# Patient Record
Sex: Female | Born: 1955 | Race: Black or African American | Hispanic: No | Marital: Single
Health system: Northeastern US, Academic
[De-identification: ages and names within clinical notes are randomized; demographics above are authoritative.]

## PROBLEM LIST (undated history)

## (undated) DIAGNOSIS — H269 Unspecified cataract: Secondary | ICD-10-CM

## (undated) DIAGNOSIS — K219 Gastro-esophageal reflux disease without esophagitis: Secondary | ICD-10-CM

## (undated) DIAGNOSIS — K5900 Constipation, unspecified: Secondary | ICD-10-CM

## (undated) DIAGNOSIS — E785 Hyperlipidemia, unspecified: Secondary | ICD-10-CM

## (undated) DIAGNOSIS — K649 Unspecified hemorrhoids: Secondary | ICD-10-CM

## (undated) DIAGNOSIS — M1990 Unspecified osteoarthritis, unspecified site: Secondary | ICD-10-CM

## (undated) DIAGNOSIS — D649 Anemia, unspecified: Secondary | ICD-10-CM

## (undated) HISTORY — DX: Gastro-esophageal reflux disease without esophagitis: K21.9

## (undated) HISTORY — DX: Unspecified osteoarthritis, unspecified site: M19.90

## (undated) HISTORY — DX: Hyperlipidemia, unspecified: E78.5

## (undated) HISTORY — DX: Constipation, unspecified: K59.00

## (undated) HISTORY — DX: Unspecified hemorrhoids: K64.9

## (undated) HISTORY — DX: Anemia, unspecified: D64.9

## (undated) HISTORY — DX: Unspecified cataract: H26.9

---
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
## 1985-07-22 HISTORY — PX: LAPAROSCOPIC SALPINGO OOPHERECTOMY: SHX5927

## 2015-01-11 DIAGNOSIS — K5900 Constipation, unspecified: Secondary | ICD-10-CM | POA: Insufficient documentation

## 2015-01-11 DIAGNOSIS — K648 Other hemorrhoids: Secondary | ICD-10-CM | POA: Insufficient documentation

## 2015-02-20 HISTORY — PX: HEMORRHOID BANDING: SHX5850

## 2015-04-17 ENCOUNTER — Ambulatory Visit (HOSPITAL_COMMUNITY)
Admission: RE | Admit: 2015-04-17 | Discharge: 2015-04-17 | Disposition: A | Discharge status: Home / Self Care | Payer: 59 | Source: Ambulatory Visit | Attending: Rheumatology | Admitting: Rheumatology

## 2015-04-17 ENCOUNTER — Other Ambulatory Visit (HOSPITAL_COMMUNITY): Payer: Self-pay | Admitting: Rheumatology

## 2015-04-17 DIAGNOSIS — Z1389 Encounter for screening for other disorder: Secondary | ICD-10-CM | POA: Diagnosis present

## 2015-04-17 DIAGNOSIS — R52 Pain, unspecified: Secondary | ICD-10-CM

## 2015-04-17 DIAGNOSIS — M419 Scoliosis, unspecified: Secondary | ICD-10-CM | POA: Insufficient documentation

## 2015-07-10 ENCOUNTER — Other Ambulatory Visit: Payer: Self-pay | Admitting: Family Medicine

## 2015-07-10 DIAGNOSIS — Z1231 Encounter for screening mammogram for malignant neoplasm of breast: Secondary | ICD-10-CM

## 2015-07-10 DIAGNOSIS — E559 Vitamin D deficiency, unspecified: Secondary | ICD-10-CM | POA: Insufficient documentation

## 2015-07-19 ENCOUNTER — Ambulatory Visit
Admission: RE | Admit: 2015-07-19 | Discharge: 2015-07-19 | Disposition: A | Discharge status: Home / Self Care | Payer: 59 | Source: Ambulatory Visit | Attending: Family Medicine | Admitting: Family Medicine

## 2015-07-19 DIAGNOSIS — Z1231 Encounter for screening mammogram for malignant neoplasm of breast: Secondary | ICD-10-CM

## 2015-10-18 DIAGNOSIS — B351 Tinea unguium: Secondary | ICD-10-CM | POA: Insufficient documentation

## 2015-10-18 DIAGNOSIS — M2010 Hallux valgus (acquired), unspecified foot: Secondary | ICD-10-CM | POA: Insufficient documentation

## 2016-07-17 ENCOUNTER — Encounter: Payer: Self-pay | Admitting: Family Medicine

## 2016-07-17 ENCOUNTER — Telehealth: Payer: Self-pay | Admitting: Family Medicine

## 2016-07-17 NOTE — Telephone Encounter (Signed)
A user error has taken place.

## 2017-01-21 ENCOUNTER — Other Ambulatory Visit: Payer: Self-pay | Admitting: Family Medicine

## 2017-01-21 DIAGNOSIS — Z1231 Encounter for screening mammogram for malignant neoplasm of breast: Secondary | ICD-10-CM

## 2017-02-04 ENCOUNTER — Ambulatory Visit
Admission: RE | Admit: 2017-02-04 | Discharge: 2017-02-04 | Disposition: A | Discharge status: Home / Self Care | Payer: Medicare HMO | Source: Ambulatory Visit | Attending: Family Medicine | Admitting: Family Medicine

## 2017-02-04 DIAGNOSIS — Z1231 Encounter for screening mammogram for malignant neoplasm of breast: Secondary | ICD-10-CM

## 2017-03-04 DIAGNOSIS — M2550 Pain in unspecified joint: Secondary | ICD-10-CM | POA: Diagnosis not present

## 2017-03-04 DIAGNOSIS — M0570 Rheumatoid arthritis with rheumatoid factor of unspecified site without organ or systems involvement: Secondary | ICD-10-CM | POA: Diagnosis not present

## 2017-03-04 DIAGNOSIS — R635 Abnormal weight gain: Secondary | ICD-10-CM | POA: Diagnosis not present

## 2017-04-09 DIAGNOSIS — E782 Mixed hyperlipidemia: Secondary | ICD-10-CM | POA: Diagnosis not present

## 2017-04-09 DIAGNOSIS — E559 Vitamin D deficiency, unspecified: Secondary | ICD-10-CM | POA: Diagnosis not present

## 2017-04-09 DIAGNOSIS — D649 Anemia, unspecified: Secondary | ICD-10-CM | POA: Diagnosis not present

## 2017-04-13 DIAGNOSIS — E559 Vitamin D deficiency, unspecified: Secondary | ICD-10-CM | POA: Diagnosis not present

## 2017-04-13 DIAGNOSIS — D649 Anemia, unspecified: Secondary | ICD-10-CM | POA: Diagnosis not present

## 2017-04-13 DIAGNOSIS — E782 Mixed hyperlipidemia: Secondary | ICD-10-CM | POA: Diagnosis not present

## 2017-06-16 DIAGNOSIS — E669 Obesity, unspecified: Secondary | ICD-10-CM | POA: Diagnosis not present

## 2017-06-16 DIAGNOSIS — R5383 Other fatigue: Secondary | ICD-10-CM | POA: Diagnosis not present

## 2017-06-16 DIAGNOSIS — Z6830 Body mass index (BMI) 30.0-30.9, adult: Secondary | ICD-10-CM | POA: Diagnosis not present

## 2017-06-16 DIAGNOSIS — M2550 Pain in unspecified joint: Secondary | ICD-10-CM | POA: Diagnosis not present

## 2017-06-16 DIAGNOSIS — M05742 Rheumatoid arthritis with rheumatoid factor of left hand without organ or systems involvement: Secondary | ICD-10-CM | POA: Diagnosis not present

## 2017-06-16 DIAGNOSIS — Z79899 Other long term (current) drug therapy: Secondary | ICD-10-CM | POA: Diagnosis not present

## 2017-06-16 DIAGNOSIS — M0579 Rheumatoid arthritis with rheumatoid factor of multiple sites without organ or systems involvement: Secondary | ICD-10-CM | POA: Diagnosis not present

## 2017-07-13 DIAGNOSIS — N898 Other specified noninflammatory disorders of vagina: Secondary | ICD-10-CM | POA: Diagnosis not present

## 2017-07-13 DIAGNOSIS — E782 Mixed hyperlipidemia: Secondary | ICD-10-CM | POA: Diagnosis not present

## 2017-07-13 DIAGNOSIS — N760 Acute vaginitis: Secondary | ICD-10-CM | POA: Diagnosis not present

## 2017-07-13 DIAGNOSIS — Z118 Encounter for screening for other infectious and parasitic diseases: Secondary | ICD-10-CM | POA: Diagnosis not present

## 2017-07-13 DIAGNOSIS — Z0131 Encounter for examination of blood pressure with abnormal findings: Secondary | ICD-10-CM | POA: Diagnosis not present

## 2017-07-14 DIAGNOSIS — E669 Obesity, unspecified: Secondary | ICD-10-CM | POA: Diagnosis not present

## 2017-07-14 DIAGNOSIS — M0579 Rheumatoid arthritis with rheumatoid factor of multiple sites without organ or systems involvement: Secondary | ICD-10-CM | POA: Diagnosis not present

## 2017-07-14 DIAGNOSIS — Z79899 Other long term (current) drug therapy: Secondary | ICD-10-CM | POA: Diagnosis not present

## 2017-07-14 DIAGNOSIS — Z6830 Body mass index (BMI) 30.0-30.9, adult: Secondary | ICD-10-CM | POA: Diagnosis not present

## 2017-07-14 DIAGNOSIS — M2550 Pain in unspecified joint: Secondary | ICD-10-CM | POA: Diagnosis not present

## 2017-08-12 DIAGNOSIS — Z0000 Encounter for general adult medical examination without abnormal findings: Secondary | ICD-10-CM | POA: Diagnosis not present

## 2017-08-12 DIAGNOSIS — Z6830 Body mass index (BMI) 30.0-30.9, adult: Secondary | ICD-10-CM | POA: Diagnosis not present

## 2017-09-25 ENCOUNTER — Encounter: Payer: Self-pay | Admitting: Internal Medicine

## 2017-10-13 DIAGNOSIS — Z79899 Other long term (current) drug therapy: Secondary | ICD-10-CM | POA: Diagnosis not present

## 2017-10-13 DIAGNOSIS — Z6830 Body mass index (BMI) 30.0-30.9, adult: Secondary | ICD-10-CM | POA: Diagnosis not present

## 2017-10-13 DIAGNOSIS — M2550 Pain in unspecified joint: Secondary | ICD-10-CM | POA: Diagnosis not present

## 2017-10-13 DIAGNOSIS — E669 Obesity, unspecified: Secondary | ICD-10-CM | POA: Diagnosis not present

## 2017-10-13 DIAGNOSIS — M0579 Rheumatoid arthritis with rheumatoid factor of multiple sites without organ or systems involvement: Secondary | ICD-10-CM | POA: Diagnosis not present

## 2017-11-04 ENCOUNTER — Other Ambulatory Visit: Payer: Self-pay

## 2017-11-04 ENCOUNTER — Ambulatory Visit (AMBULATORY_SURGERY_CENTER): Payer: Self-pay | Admitting: *Deleted

## 2017-11-04 VITALS — Ht 70.0 in | Wt 212.0 lb

## 2017-11-04 DIAGNOSIS — Z1211 Encounter for screening for malignant neoplasm of colon: Secondary | ICD-10-CM

## 2017-11-04 NOTE — Progress Notes (Signed)
   No egg or soy allergy known to patient  No issues with past sedation with any surgeries  or procedures, no intubation problems  No diet pills per patient No home 02 use per patient  No blood thinners per patient  Pt denies issues with constipation -- as long as takes stool softener daily  No A fib or A flutter  EMMI video sent to pt's e mail

## 2017-11-17 ENCOUNTER — Encounter: Payer: Self-pay | Admitting: Internal Medicine

## 2017-11-26 ENCOUNTER — Encounter: Payer: Medicare HMO | Admitting: Internal Medicine

## 2017-12-30 ENCOUNTER — Other Ambulatory Visit: Payer: Self-pay | Admitting: Family Medicine

## 2017-12-30 DIAGNOSIS — Z1231 Encounter for screening mammogram for malignant neoplasm of breast: Secondary | ICD-10-CM

## 2018-01-13 DIAGNOSIS — E669 Obesity, unspecified: Secondary | ICD-10-CM | POA: Diagnosis not present

## 2018-01-13 DIAGNOSIS — M0579 Rheumatoid arthritis with rheumatoid factor of multiple sites without organ or systems involvement: Secondary | ICD-10-CM | POA: Diagnosis not present

## 2018-01-13 DIAGNOSIS — Z79899 Other long term (current) drug therapy: Secondary | ICD-10-CM | POA: Diagnosis not present

## 2018-01-13 DIAGNOSIS — M2550 Pain in unspecified joint: Secondary | ICD-10-CM | POA: Diagnosis not present

## 2018-01-13 DIAGNOSIS — Z6830 Body mass index (BMI) 30.0-30.9, adult: Secondary | ICD-10-CM | POA: Diagnosis not present

## 2018-02-03 DIAGNOSIS — R5383 Other fatigue: Secondary | ICD-10-CM | POA: Diagnosis not present

## 2018-02-03 DIAGNOSIS — E559 Vitamin D deficiency, unspecified: Secondary | ICD-10-CM | POA: Diagnosis not present

## 2018-02-03 DIAGNOSIS — E782 Mixed hyperlipidemia: Secondary | ICD-10-CM | POA: Diagnosis not present

## 2018-02-03 DIAGNOSIS — Z13220 Encounter for screening for lipoid disorders: Secondary | ICD-10-CM | POA: Diagnosis not present

## 2018-02-04 DIAGNOSIS — H524 Presbyopia: Secondary | ICD-10-CM | POA: Diagnosis not present

## 2018-02-10 DIAGNOSIS — E559 Vitamin D deficiency, unspecified: Secondary | ICD-10-CM | POA: Diagnosis not present

## 2018-02-10 DIAGNOSIS — E782 Mixed hyperlipidemia: Secondary | ICD-10-CM | POA: Diagnosis not present

## 2018-02-10 DIAGNOSIS — R5383 Other fatigue: Secondary | ICD-10-CM | POA: Diagnosis not present

## 2018-02-10 DIAGNOSIS — Z6830 Body mass index (BMI) 30.0-30.9, adult: Secondary | ICD-10-CM | POA: Diagnosis not present

## 2018-03-02 ENCOUNTER — Other Ambulatory Visit: Payer: Self-pay | Admitting: Family Medicine

## 2018-03-02 ENCOUNTER — Ambulatory Visit
Admission: RE | Admit: 2018-03-02 | Discharge: 2018-03-02 | Disposition: A | Discharge status: Home / Self Care | Payer: Medicare HMO | Source: Ambulatory Visit | Attending: Family Medicine | Admitting: Family Medicine

## 2018-03-02 DIAGNOSIS — Z1231 Encounter for screening mammogram for malignant neoplasm of breast: Secondary | ICD-10-CM | POA: Diagnosis not present

## 2018-03-04 DIAGNOSIS — H40023 Open angle with borderline findings, high risk, bilateral: Secondary | ICD-10-CM | POA: Diagnosis not present

## 2018-03-08 DIAGNOSIS — D72818 Other decreased white blood cell count: Secondary | ICD-10-CM | POA: Diagnosis not present

## 2018-03-08 DIAGNOSIS — E782 Mixed hyperlipidemia: Secondary | ICD-10-CM | POA: Diagnosis not present

## 2018-03-12 DIAGNOSIS — E782 Mixed hyperlipidemia: Secondary | ICD-10-CM | POA: Diagnosis not present

## 2018-03-12 DIAGNOSIS — D72818 Other decreased white blood cell count: Secondary | ICD-10-CM | POA: Diagnosis not present

## 2018-03-12 DIAGNOSIS — R69 Illness, unspecified: Secondary | ICD-10-CM | POA: Diagnosis not present

## 2018-04-19 DIAGNOSIS — E663 Overweight: Secondary | ICD-10-CM | POA: Diagnosis not present

## 2018-04-19 DIAGNOSIS — Z6829 Body mass index (BMI) 29.0-29.9, adult: Secondary | ICD-10-CM | POA: Diagnosis not present

## 2018-04-19 DIAGNOSIS — M0579 Rheumatoid arthritis with rheumatoid factor of multiple sites without organ or systems involvement: Secondary | ICD-10-CM | POA: Diagnosis not present

## 2018-04-19 DIAGNOSIS — Z79899 Other long term (current) drug therapy: Secondary | ICD-10-CM | POA: Diagnosis not present

## 2018-04-19 DIAGNOSIS — M2550 Pain in unspecified joint: Secondary | ICD-10-CM | POA: Diagnosis not present

## 2018-06-16 DIAGNOSIS — H353131 Nonexudative age-related macular degeneration, bilateral, early dry stage: Secondary | ICD-10-CM | POA: Diagnosis not present

## 2018-06-16 DIAGNOSIS — H5203 Hypermetropia, bilateral: Secondary | ICD-10-CM | POA: Diagnosis not present

## 2018-06-16 DIAGNOSIS — H2513 Age-related nuclear cataract, bilateral: Secondary | ICD-10-CM | POA: Diagnosis not present

## 2018-06-16 DIAGNOSIS — H401133 Primary open-angle glaucoma, bilateral, severe stage: Secondary | ICD-10-CM | POA: Diagnosis not present

## 2018-06-29 DIAGNOSIS — R69 Illness, unspecified: Secondary | ICD-10-CM | POA: Diagnosis not present

## 2018-06-29 DIAGNOSIS — Z0000 Encounter for general adult medical examination without abnormal findings: Secondary | ICD-10-CM | POA: Diagnosis not present

## 2018-06-29 DIAGNOSIS — Z6829 Body mass index (BMI) 29.0-29.9, adult: Secondary | ICD-10-CM | POA: Diagnosis not present

## 2018-06-29 DIAGNOSIS — Z1211 Encounter for screening for malignant neoplasm of colon: Secondary | ICD-10-CM | POA: Diagnosis not present

## 2018-07-20 DIAGNOSIS — M0579 Rheumatoid arthritis with rheumatoid factor of multiple sites without organ or systems involvement: Secondary | ICD-10-CM | POA: Diagnosis not present

## 2018-07-20 DIAGNOSIS — E663 Overweight: Secondary | ICD-10-CM | POA: Diagnosis not present

## 2018-07-20 DIAGNOSIS — Z6829 Body mass index (BMI) 29.0-29.9, adult: Secondary | ICD-10-CM | POA: Diagnosis not present

## 2018-07-20 DIAGNOSIS — M2550 Pain in unspecified joint: Secondary | ICD-10-CM | POA: Diagnosis not present

## 2018-07-20 DIAGNOSIS — Z79899 Other long term (current) drug therapy: Secondary | ICD-10-CM | POA: Diagnosis not present

## 2018-07-30 ENCOUNTER — Encounter: Payer: Self-pay | Admitting: Family Medicine

## 2018-09-13 DIAGNOSIS — Z0000 Encounter for general adult medical examination without abnormal findings: Secondary | ICD-10-CM | POA: Diagnosis not present

## 2018-09-13 DIAGNOSIS — Z79899 Other long term (current) drug therapy: Secondary | ICD-10-CM | POA: Diagnosis not present

## 2018-09-13 DIAGNOSIS — Z0131 Encounter for examination of blood pressure with abnormal findings: Secondary | ICD-10-CM | POA: Diagnosis not present

## 2018-09-13 DIAGNOSIS — Z13220 Encounter for screening for lipoid disorders: Secondary | ICD-10-CM | POA: Diagnosis not present

## 2018-09-13 DIAGNOSIS — E782 Mixed hyperlipidemia: Secondary | ICD-10-CM | POA: Diagnosis not present

## 2018-09-13 DIAGNOSIS — E559 Vitamin D deficiency, unspecified: Secondary | ICD-10-CM | POA: Diagnosis not present

## 2018-09-15 DIAGNOSIS — E559 Vitamin D deficiency, unspecified: Secondary | ICD-10-CM | POA: Diagnosis not present

## 2018-09-15 DIAGNOSIS — R69 Illness, unspecified: Secondary | ICD-10-CM | POA: Diagnosis not present

## 2018-09-15 DIAGNOSIS — Z23 Encounter for immunization: Secondary | ICD-10-CM | POA: Diagnosis not present

## 2018-09-15 DIAGNOSIS — Z6830 Body mass index (BMI) 30.0-30.9, adult: Secondary | ICD-10-CM | POA: Diagnosis not present

## 2018-09-15 DIAGNOSIS — E782 Mixed hyperlipidemia: Secondary | ICD-10-CM | POA: Diagnosis not present

## 2018-10-26 DIAGNOSIS — M0579 Rheumatoid arthritis with rheumatoid factor of multiple sites without organ or systems involvement: Secondary | ICD-10-CM | POA: Diagnosis not present

## 2018-10-26 DIAGNOSIS — M2550 Pain in unspecified joint: Secondary | ICD-10-CM | POA: Diagnosis not present

## 2018-10-26 DIAGNOSIS — E669 Obesity, unspecified: Secondary | ICD-10-CM | POA: Diagnosis not present

## 2018-10-26 DIAGNOSIS — Z79899 Other long term (current) drug therapy: Secondary | ICD-10-CM | POA: Diagnosis not present

## 2018-10-26 DIAGNOSIS — Z6830 Body mass index (BMI) 30.0-30.9, adult: Secondary | ICD-10-CM | POA: Diagnosis not present

## 2018-12-13 DIAGNOSIS — I6782 Cerebral ischemia: Secondary | ICD-10-CM | POA: Diagnosis not present

## 2018-12-13 DIAGNOSIS — R202 Paresthesia of skin: Secondary | ICD-10-CM | POA: Diagnosis not present

## 2018-12-13 DIAGNOSIS — G9389 Other specified disorders of brain: Secondary | ICD-10-CM | POA: Diagnosis not present

## 2018-12-13 DIAGNOSIS — K148 Other diseases of tongue: Secondary | ICD-10-CM | POA: Diagnosis not present

## 2018-12-13 DIAGNOSIS — R200 Anesthesia of skin: Secondary | ICD-10-CM | POA: Diagnosis not present

## 2018-12-28 DIAGNOSIS — Z733 Stress, not elsewhere classified: Secondary | ICD-10-CM | POA: Diagnosis not present

## 2018-12-28 DIAGNOSIS — G510 Bell's palsy: Secondary | ICD-10-CM | POA: Diagnosis not present

## 2018-12-28 DIAGNOSIS — R69 Illness, unspecified: Secondary | ICD-10-CM | POA: Diagnosis not present

## 2018-12-28 DIAGNOSIS — Z6829 Body mass index (BMI) 29.0-29.9, adult: Secondary | ICD-10-CM | POA: Diagnosis not present

## 2019-01-28 DIAGNOSIS — E663 Overweight: Secondary | ICD-10-CM | POA: Diagnosis not present

## 2019-01-28 DIAGNOSIS — M2550 Pain in unspecified joint: Secondary | ICD-10-CM | POA: Diagnosis not present

## 2019-01-28 DIAGNOSIS — Z79899 Other long term (current) drug therapy: Secondary | ICD-10-CM | POA: Diagnosis not present

## 2019-01-28 DIAGNOSIS — M0579 Rheumatoid arthritis with rheumatoid factor of multiple sites without organ or systems involvement: Secondary | ICD-10-CM | POA: Diagnosis not present

## 2019-01-28 DIAGNOSIS — Z6829 Body mass index (BMI) 29.0-29.9, adult: Secondary | ICD-10-CM | POA: Diagnosis not present

## 2019-04-12 ENCOUNTER — Other Ambulatory Visit: Payer: Self-pay | Admitting: Family Medicine

## 2019-04-12 DIAGNOSIS — Z1231 Encounter for screening mammogram for malignant neoplasm of breast: Secondary | ICD-10-CM

## 2019-04-28 DIAGNOSIS — M0579 Rheumatoid arthritis with rheumatoid factor of multiple sites without organ or systems involvement: Secondary | ICD-10-CM | POA: Diagnosis not present

## 2019-06-15 ENCOUNTER — Ambulatory Visit
Admission: RE | Admit: 2019-06-15 | Discharge: 2019-06-15 | Disposition: A | Discharge status: Home / Self Care | Payer: Medicare HMO | Source: Ambulatory Visit | Attending: Family Medicine | Admitting: Family Medicine

## 2019-06-15 DIAGNOSIS — Z1231 Encounter for screening mammogram for malignant neoplasm of breast: Secondary | ICD-10-CM | POA: Diagnosis not present

## 2019-08-01 DIAGNOSIS — Z79899 Other long term (current) drug therapy: Secondary | ICD-10-CM | POA: Diagnosis not present

## 2019-08-01 DIAGNOSIS — R350 Frequency of micturition: Secondary | ICD-10-CM | POA: Diagnosis not present

## 2019-08-01 DIAGNOSIS — M170 Bilateral primary osteoarthritis of knee: Secondary | ICD-10-CM | POA: Diagnosis not present

## 2019-08-01 DIAGNOSIS — M0579 Rheumatoid arthritis with rheumatoid factor of multiple sites without organ or systems involvement: Secondary | ICD-10-CM | POA: Diagnosis not present

## 2019-08-01 DIAGNOSIS — M2550 Pain in unspecified joint: Secondary | ICD-10-CM | POA: Diagnosis not present

## 2019-08-03 DIAGNOSIS — M170 Bilateral primary osteoarthritis of knee: Secondary | ICD-10-CM | POA: Diagnosis not present

## 2019-08-03 DIAGNOSIS — R32 Unspecified urinary incontinence: Secondary | ICD-10-CM | POA: Diagnosis not present

## 2019-08-03 DIAGNOSIS — B373 Candidiasis of vulva and vagina: Secondary | ICD-10-CM | POA: Diagnosis not present

## 2019-08-03 DIAGNOSIS — Z0000 Encounter for general adult medical examination without abnormal findings: Secondary | ICD-10-CM | POA: Diagnosis not present

## 2019-08-23 DIAGNOSIS — Z1159 Encounter for screening for other viral diseases: Secondary | ICD-10-CM | POA: Diagnosis not present

## 2019-08-30 DIAGNOSIS — M170 Bilateral primary osteoarthritis of knee: Secondary | ICD-10-CM | POA: Diagnosis not present

## 2019-09-03 DIAGNOSIS — H409 Unspecified glaucoma: Secondary | ICD-10-CM | POA: Diagnosis not present

## 2019-09-03 DIAGNOSIS — H524 Presbyopia: Secondary | ICD-10-CM | POA: Diagnosis not present

## 2019-09-03 DIAGNOSIS — H52209 Unspecified astigmatism, unspecified eye: Secondary | ICD-10-CM | POA: Diagnosis not present

## 2019-09-03 DIAGNOSIS — H5203 Hypermetropia, bilateral: Secondary | ICD-10-CM | POA: Diagnosis not present

## 2019-09-03 DIAGNOSIS — H269 Unspecified cataract: Secondary | ICD-10-CM | POA: Diagnosis not present

## 2019-10-28 DIAGNOSIS — E559 Vitamin D deficiency, unspecified: Secondary | ICD-10-CM | POA: Diagnosis not present

## 2019-10-28 DIAGNOSIS — E782 Mixed hyperlipidemia: Secondary | ICD-10-CM | POA: Diagnosis not present

## 2019-10-28 DIAGNOSIS — M0579 Rheumatoid arthritis with rheumatoid factor of multiple sites without organ or systems involvement: Secondary | ICD-10-CM | POA: Diagnosis not present

## 2019-11-03 DIAGNOSIS — N952 Postmenopausal atrophic vaginitis: Secondary | ICD-10-CM | POA: Diagnosis not present

## 2019-11-03 DIAGNOSIS — E782 Mixed hyperlipidemia: Secondary | ICD-10-CM | POA: Diagnosis not present

## 2019-11-03 DIAGNOSIS — E559 Vitamin D deficiency, unspecified: Secondary | ICD-10-CM | POA: Diagnosis not present

## 2019-11-08 DIAGNOSIS — Z23 Encounter for immunization: Secondary | ICD-10-CM | POA: Diagnosis not present

## 2019-12-28 DIAGNOSIS — N390 Urinary tract infection, site not specified: Secondary | ICD-10-CM | POA: Diagnosis not present

## 2020-01-18 DIAGNOSIS — M170 Bilateral primary osteoarthritis of knee: Secondary | ICD-10-CM | POA: Diagnosis not present

## 2020-01-18 DIAGNOSIS — N952 Postmenopausal atrophic vaginitis: Secondary | ICD-10-CM | POA: Diagnosis not present

## 2020-01-18 DIAGNOSIS — N390 Urinary tract infection, site not specified: Secondary | ICD-10-CM | POA: Diagnosis not present

## 2020-02-07 DIAGNOSIS — Z6831 Body mass index (BMI) 31.0-31.9, adult: Secondary | ICD-10-CM | POA: Diagnosis not present

## 2020-02-07 DIAGNOSIS — M2550 Pain in unspecified joint: Secondary | ICD-10-CM | POA: Diagnosis not present

## 2020-02-07 DIAGNOSIS — M170 Bilateral primary osteoarthritis of knee: Secondary | ICD-10-CM | POA: Diagnosis not present

## 2020-02-07 DIAGNOSIS — Z79899 Other long term (current) drug therapy: Secondary | ICD-10-CM | POA: Diagnosis not present

## 2020-02-07 DIAGNOSIS — M0579 Rheumatoid arthritis with rheumatoid factor of multiple sites without organ or systems involvement: Secondary | ICD-10-CM | POA: Diagnosis not present

## 2020-02-07 DIAGNOSIS — E669 Obesity, unspecified: Secondary | ICD-10-CM | POA: Diagnosis not present

## 2020-03-22 DIAGNOSIS — M1711 Unilateral primary osteoarthritis, right knee: Secondary | ICD-10-CM | POA: Diagnosis not present

## 2020-03-22 DIAGNOSIS — M25562 Pain in left knee: Secondary | ICD-10-CM | POA: Diagnosis not present

## 2020-03-22 DIAGNOSIS — M170 Bilateral primary osteoarthritis of knee: Secondary | ICD-10-CM | POA: Diagnosis not present

## 2020-03-22 DIAGNOSIS — M1712 Unilateral primary osteoarthritis, left knee: Secondary | ICD-10-CM | POA: Diagnosis not present

## 2020-03-22 DIAGNOSIS — M25561 Pain in right knee: Secondary | ICD-10-CM | POA: Diagnosis not present

## 2020-05-08 DIAGNOSIS — M0579 Rheumatoid arthritis with rheumatoid factor of multiple sites without organ or systems involvement: Secondary | ICD-10-CM | POA: Diagnosis not present

## 2020-06-07 ENCOUNTER — Other Ambulatory Visit: Payer: Self-pay | Admitting: Family Medicine

## 2020-06-12 DIAGNOSIS — N390 Urinary tract infection, site not specified: Secondary | ICD-10-CM | POA: Diagnosis not present

## 2020-06-12 DIAGNOSIS — N3941 Urge incontinence: Secondary | ICD-10-CM | POA: Diagnosis not present

## 2020-06-12 DIAGNOSIS — M545 Low back pain: Secondary | ICD-10-CM | POA: Diagnosis not present

## 2020-06-12 DIAGNOSIS — N3000 Acute cystitis without hematuria: Secondary | ICD-10-CM | POA: Diagnosis not present

## 2020-07-23 ENCOUNTER — Other Ambulatory Visit: Payer: Self-pay | Admitting: Family Medicine

## 2020-07-23 DIAGNOSIS — Z1231 Encounter for screening mammogram for malignant neoplasm of breast: Secondary | ICD-10-CM

## 2020-08-03 DIAGNOSIS — Z0000 Encounter for general adult medical examination without abnormal findings: Secondary | ICD-10-CM | POA: Diagnosis not present

## 2020-08-03 DIAGNOSIS — Z1339 Encounter for screening examination for other mental health and behavioral disorders: Secondary | ICD-10-CM | POA: Diagnosis not present

## 2020-08-03 DIAGNOSIS — N3941 Urge incontinence: Secondary | ICD-10-CM | POA: Diagnosis not present

## 2020-08-03 DIAGNOSIS — Z1331 Encounter for screening for depression: Secondary | ICD-10-CM | POA: Diagnosis not present

## 2020-08-06 ENCOUNTER — Other Ambulatory Visit: Payer: Self-pay | Admitting: Family Medicine

## 2020-08-06 DIAGNOSIS — R5381 Other malaise: Secondary | ICD-10-CM

## 2020-08-07 ENCOUNTER — Ambulatory Visit
Admission: RE | Admit: 2020-08-07 | Discharge: 2020-08-07 | Disposition: A | Discharge status: Home / Self Care | Payer: Medicare HMO | Source: Ambulatory Visit | Attending: Family Medicine | Admitting: Family Medicine

## 2020-08-07 ENCOUNTER — Other Ambulatory Visit: Payer: Self-pay

## 2020-08-07 DIAGNOSIS — Z1231 Encounter for screening mammogram for malignant neoplasm of breast: Secondary | ICD-10-CM | POA: Diagnosis not present

## 2020-08-13 ENCOUNTER — Other Ambulatory Visit: Payer: Self-pay | Admitting: Family Medicine

## 2020-08-13 DIAGNOSIS — E2839 Other primary ovarian failure: Secondary | ICD-10-CM

## 2020-08-14 DIAGNOSIS — M2550 Pain in unspecified joint: Secondary | ICD-10-CM | POA: Diagnosis not present

## 2020-08-14 DIAGNOSIS — M0579 Rheumatoid arthritis with rheumatoid factor of multiple sites without organ or systems involvement: Secondary | ICD-10-CM | POA: Diagnosis not present

## 2020-08-14 DIAGNOSIS — Z6829 Body mass index (BMI) 29.0-29.9, adult: Secondary | ICD-10-CM | POA: Diagnosis not present

## 2020-08-14 DIAGNOSIS — Z79899 Other long term (current) drug therapy: Secondary | ICD-10-CM | POA: Diagnosis not present

## 2020-08-14 DIAGNOSIS — M170 Bilateral primary osteoarthritis of knee: Secondary | ICD-10-CM | POA: Diagnosis not present

## 2020-08-14 DIAGNOSIS — E663 Overweight: Secondary | ICD-10-CM | POA: Diagnosis not present

## 2020-08-23 DIAGNOSIS — M25561 Pain in right knee: Secondary | ICD-10-CM | POA: Diagnosis not present

## 2020-09-13 DIAGNOSIS — R7989 Other specified abnormal findings of blood chemistry: Secondary | ICD-10-CM | POA: Diagnosis not present

## 2020-09-26 DIAGNOSIS — Z23 Encounter for immunization: Secondary | ICD-10-CM | POA: Diagnosis not present

## 2020-10-31 DIAGNOSIS — Z1211 Encounter for screening for malignant neoplasm of colon: Secondary | ICD-10-CM | POA: Diagnosis not present

## 2020-11-14 DIAGNOSIS — M0579 Rheumatoid arthritis with rheumatoid factor of multiple sites without organ or systems involvement: Secondary | ICD-10-CM | POA: Diagnosis not present

## 2020-11-22 ENCOUNTER — Ambulatory Visit
Admission: RE | Admit: 2020-11-22 | Discharge: 2020-11-22 | Disposition: A | Discharge status: Home / Self Care | Payer: Medicare HMO | Source: Ambulatory Visit | Attending: Family Medicine | Admitting: Family Medicine

## 2020-11-22 ENCOUNTER — Other Ambulatory Visit: Payer: Self-pay

## 2020-11-22 DIAGNOSIS — E2839 Other primary ovarian failure: Secondary | ICD-10-CM

## 2020-11-22 DIAGNOSIS — Z780 Asymptomatic menopausal state: Secondary | ICD-10-CM | POA: Diagnosis not present

## 2020-11-22 DIAGNOSIS — M85851 Other specified disorders of bone density and structure, right thigh: Secondary | ICD-10-CM | POA: Diagnosis not present

## 2020-12-03 DIAGNOSIS — E559 Vitamin D deficiency, unspecified: Secondary | ICD-10-CM | POA: Diagnosis not present

## 2020-12-10 DIAGNOSIS — M8580 Other specified disorders of bone density and structure, unspecified site: Secondary | ICD-10-CM | POA: Diagnosis not present

## 2020-12-10 DIAGNOSIS — E559 Vitamin D deficiency, unspecified: Secondary | ICD-10-CM | POA: Diagnosis not present

## 2021-02-13 DIAGNOSIS — H5203 Hypermetropia, bilateral: Secondary | ICD-10-CM | POA: Diagnosis not present

## 2021-02-14 DIAGNOSIS — M2550 Pain in unspecified joint: Secondary | ICD-10-CM | POA: Diagnosis not present

## 2021-02-14 DIAGNOSIS — M0579 Rheumatoid arthritis with rheumatoid factor of multiple sites without organ or systems involvement: Secondary | ICD-10-CM | POA: Diagnosis not present

## 2021-02-14 DIAGNOSIS — Z6828 Body mass index (BMI) 28.0-28.9, adult: Secondary | ICD-10-CM | POA: Diagnosis not present

## 2021-02-14 DIAGNOSIS — E663 Overweight: Secondary | ICD-10-CM | POA: Diagnosis not present

## 2021-02-14 DIAGNOSIS — M170 Bilateral primary osteoarthritis of knee: Secondary | ICD-10-CM | POA: Diagnosis not present

## 2021-02-14 DIAGNOSIS — Z79899 Other long term (current) drug therapy: Secondary | ICD-10-CM | POA: Diagnosis not present

## 2021-05-14 DIAGNOSIS — M0579 Rheumatoid arthritis with rheumatoid factor of multiple sites without organ or systems involvement: Secondary | ICD-10-CM | POA: Diagnosis not present

## 2021-07-02 ENCOUNTER — Other Ambulatory Visit: Payer: Self-pay | Admitting: Family Medicine

## 2021-07-02 DIAGNOSIS — Z1231 Encounter for screening mammogram for malignant neoplasm of breast: Secondary | ICD-10-CM

## 2021-08-05 DIAGNOSIS — Z1339 Encounter for screening examination for other mental health and behavioral disorders: Secondary | ICD-10-CM | POA: Diagnosis not present

## 2021-08-05 DIAGNOSIS — Z7189 Other specified counseling: Secondary | ICD-10-CM | POA: Diagnosis not present

## 2021-08-05 DIAGNOSIS — Z1331 Encounter for screening for depression: Secondary | ICD-10-CM | POA: Diagnosis not present

## 2021-08-05 DIAGNOSIS — Z0000 Encounter for general adult medical examination without abnormal findings: Secondary | ICD-10-CM | POA: Diagnosis not present

## 2021-08-05 DIAGNOSIS — Z720 Tobacco use: Secondary | ICD-10-CM | POA: Diagnosis not present

## 2021-08-12 ENCOUNTER — Other Ambulatory Visit: Payer: Self-pay | Admitting: Family Medicine

## 2021-08-12 DIAGNOSIS — Z720 Tobacco use: Secondary | ICD-10-CM

## 2021-08-14 DIAGNOSIS — Z6826 Body mass index (BMI) 26.0-26.9, adult: Secondary | ICD-10-CM | POA: Diagnosis not present

## 2021-08-14 DIAGNOSIS — M170 Bilateral primary osteoarthritis of knee: Secondary | ICD-10-CM | POA: Diagnosis not present

## 2021-08-14 DIAGNOSIS — M2550 Pain in unspecified joint: Secondary | ICD-10-CM | POA: Diagnosis not present

## 2021-08-14 DIAGNOSIS — Z79899 Other long term (current) drug therapy: Secondary | ICD-10-CM | POA: Diagnosis not present

## 2021-08-14 DIAGNOSIS — M0579 Rheumatoid arthritis with rheumatoid factor of multiple sites without organ or systems involvement: Secondary | ICD-10-CM | POA: Diagnosis not present

## 2021-08-14 DIAGNOSIS — E663 Overweight: Secondary | ICD-10-CM | POA: Diagnosis not present

## 2021-08-20 DIAGNOSIS — E559 Vitamin D deficiency, unspecified: Secondary | ICD-10-CM | POA: Diagnosis not present

## 2021-08-20 DIAGNOSIS — E782 Mixed hyperlipidemia: Secondary | ICD-10-CM | POA: Diagnosis not present

## 2021-08-20 DIAGNOSIS — D649 Anemia, unspecified: Secondary | ICD-10-CM | POA: Diagnosis not present

## 2021-08-20 DIAGNOSIS — Z23 Encounter for immunization: Secondary | ICD-10-CM | POA: Diagnosis not present

## 2021-08-23 DIAGNOSIS — E782 Mixed hyperlipidemia: Secondary | ICD-10-CM | POA: Diagnosis not present

## 2021-08-23 DIAGNOSIS — E559 Vitamin D deficiency, unspecified: Secondary | ICD-10-CM | POA: Diagnosis not present

## 2021-08-23 DIAGNOSIS — R69 Illness, unspecified: Secondary | ICD-10-CM | POA: Diagnosis not present

## 2021-08-23 DIAGNOSIS — D649 Anemia, unspecified: Secondary | ICD-10-CM | POA: Diagnosis not present

## 2021-08-28 ENCOUNTER — Ambulatory Visit
Admission: RE | Admit: 2021-08-28 | Discharge: 2021-08-28 | Disposition: A | Discharge status: Home / Self Care | Payer: Medicare HMO | Source: Ambulatory Visit | Attending: Family Medicine | Admitting: Family Medicine

## 2021-08-28 ENCOUNTER — Other Ambulatory Visit: Payer: Self-pay

## 2021-08-28 DIAGNOSIS — Z1231 Encounter for screening mammogram for malignant neoplasm of breast: Secondary | ICD-10-CM

## 2021-09-02 ENCOUNTER — Inpatient Hospital Stay: Admission: RE | Admit: 2021-09-02 | Payer: Medicare HMO | Source: Ambulatory Visit

## 2021-11-13 DIAGNOSIS — M0579 Rheumatoid arthritis with rheumatoid factor of multiple sites without organ or systems involvement: Secondary | ICD-10-CM | POA: Diagnosis not present

## 2021-11-17 ENCOUNTER — Emergency Department (HOSPITAL_COMMUNITY): Payer: Medicare HMO

## 2021-11-17 ENCOUNTER — Inpatient Hospital Stay (HOSPITAL_COMMUNITY)
Admission: EM | Admit: 2021-11-17 | Discharge: 2022-02-05 | DRG: 329 | Disposition: A | Discharge status: Skilled Nursing Facility | Payer: Medicare HMO | Attending: Surgery | Admitting: Surgery

## 2021-11-17 ENCOUNTER — Encounter (HOSPITAL_COMMUNITY): Admission: EM | Disposition: A | Discharge status: Skilled Nursing Facility | Payer: Self-pay | Source: Home / Self Care

## 2021-11-17 ENCOUNTER — Encounter (HOSPITAL_COMMUNITY): Payer: Self-pay | Admitting: Emergency Medicine

## 2021-11-17 ENCOUNTER — Inpatient Hospital Stay (HOSPITAL_COMMUNITY): Payer: Medicare HMO | Admitting: Certified Registered Nurse Anesthetist

## 2021-11-17 ENCOUNTER — Other Ambulatory Visit: Payer: Self-pay

## 2021-11-17 DIAGNOSIS — N390 Urinary tract infection, site not specified: Secondary | ICD-10-CM | POA: Diagnosis not present

## 2021-11-17 DIAGNOSIS — R410 Disorientation, unspecified: Secondary | ICD-10-CM | POA: Diagnosis not present

## 2021-11-17 DIAGNOSIS — E43 Unspecified severe protein-calorie malnutrition: Secondary | ICD-10-CM | POA: Diagnosis present

## 2021-11-17 DIAGNOSIS — E538 Deficiency of other specified B group vitamins: Secondary | ICD-10-CM | POA: Diagnosis present

## 2021-11-17 DIAGNOSIS — K55049 Acute infarction of large intestine, extent unspecified: Secondary | ICD-10-CM | POA: Diagnosis present

## 2021-11-17 DIAGNOSIS — R413 Other amnesia: Secondary | ICD-10-CM | POA: Diagnosis not present

## 2021-11-17 DIAGNOSIS — R270 Ataxia, unspecified: Secondary | ICD-10-CM | POA: Diagnosis not present

## 2021-11-17 DIAGNOSIS — K658 Other peritonitis: Secondary | ICD-10-CM | POA: Diagnosis not present

## 2021-11-17 DIAGNOSIS — H269 Unspecified cataract: Secondary | ICD-10-CM | POA: Diagnosis present

## 2021-11-17 DIAGNOSIS — M25572 Pain in left ankle and joints of left foot: Secondary | ICD-10-CM | POA: Diagnosis not present

## 2021-11-17 DIAGNOSIS — K631 Perforation of intestine (nontraumatic): Secondary | ICD-10-CM | POA: Diagnosis not present

## 2021-11-17 DIAGNOSIS — N200 Calculus of kidney: Secondary | ICD-10-CM | POA: Diagnosis not present

## 2021-11-17 DIAGNOSIS — G9340 Encephalopathy, unspecified: Secondary | ICD-10-CM | POA: Diagnosis present

## 2021-11-17 DIAGNOSIS — Z932 Ileostomy status: Secondary | ICD-10-CM | POA: Diagnosis not present

## 2021-11-17 DIAGNOSIS — E531 Pyridoxine deficiency: Secondary | ICD-10-CM | POA: Diagnosis present

## 2021-11-17 DIAGNOSIS — R7401 Elevation of levels of liver transaminase levels: Secondary | ICD-10-CM | POA: Diagnosis present

## 2021-11-17 DIAGNOSIS — R198 Other specified symptoms and signs involving the digestive system and abdomen: Secondary | ICD-10-CM

## 2021-11-17 DIAGNOSIS — B952 Enterococcus as the cause of diseases classified elsewhere: Secondary | ICD-10-CM | POA: Diagnosis present

## 2021-11-17 DIAGNOSIS — D649 Anemia, unspecified: Secondary | ICD-10-CM | POA: Diagnosis present

## 2021-11-17 DIAGNOSIS — I82413 Acute embolism and thrombosis of femoral vein, bilateral: Secondary | ICD-10-CM | POA: Diagnosis not present

## 2021-11-17 DIAGNOSIS — K9420 Gastrostomy complication, unspecified: Secondary | ICD-10-CM

## 2021-11-17 DIAGNOSIS — F05 Delirium due to known physiological condition: Secondary | ICD-10-CM | POA: Diagnosis not present

## 2021-11-17 DIAGNOSIS — M19072 Primary osteoarthritis, left ankle and foot: Secondary | ICD-10-CM | POA: Diagnosis present

## 2021-11-17 DIAGNOSIS — D251 Intramural leiomyoma of uterus: Secondary | ICD-10-CM | POA: Diagnosis not present

## 2021-11-17 DIAGNOSIS — K56609 Unspecified intestinal obstruction, unspecified as to partial versus complete obstruction: Secondary | ICD-10-CM | POA: Diagnosis not present

## 2021-11-17 DIAGNOSIS — I739 Peripheral vascular disease, unspecified: Secondary | ICD-10-CM | POA: Diagnosis not present

## 2021-11-17 DIAGNOSIS — K219 Gastro-esophageal reflux disease without esophagitis: Secondary | ICD-10-CM | POA: Diagnosis present

## 2021-11-17 DIAGNOSIS — R109 Unspecified abdominal pain: Secondary | ICD-10-CM | POA: Diagnosis not present

## 2021-11-17 DIAGNOSIS — E871 Hypo-osmolality and hyponatremia: Secondary | ICD-10-CM | POA: Diagnosis not present

## 2021-11-17 DIAGNOSIS — L97121 Non-pressure chronic ulcer of left thigh limited to breakdown of skin: Secondary | ICD-10-CM | POA: Diagnosis not present

## 2021-11-17 DIAGNOSIS — Z872 Personal history of diseases of the skin and subcutaneous tissue: Secondary | ICD-10-CM | POA: Diagnosis not present

## 2021-11-17 DIAGNOSIS — G9341 Metabolic encephalopathy: Secondary | ICD-10-CM | POA: Diagnosis not present

## 2021-11-17 DIAGNOSIS — K838 Other specified diseases of biliary tract: Secondary | ICD-10-CM | POA: Diagnosis not present

## 2021-11-17 DIAGNOSIS — E785 Hyperlipidemia, unspecified: Secondary | ICD-10-CM | POA: Diagnosis present

## 2021-11-17 DIAGNOSIS — B9562 Methicillin resistant Staphylococcus aureus infection as the cause of diseases classified elsewhere: Secondary | ICD-10-CM | POA: Diagnosis present

## 2021-11-17 DIAGNOSIS — J9811 Atelectasis: Secondary | ICD-10-CM

## 2021-11-17 DIAGNOSIS — K5720 Diverticulitis of large intestine with perforation and abscess without bleeding: Secondary | ICD-10-CM | POA: Diagnosis present

## 2021-11-17 DIAGNOSIS — Z791 Long term (current) use of non-steroidal anti-inflammatories (NSAID): Secondary | ICD-10-CM

## 2021-11-17 DIAGNOSIS — M19032 Primary osteoarthritis, left wrist: Secondary | ICD-10-CM | POA: Diagnosis present

## 2021-11-17 DIAGNOSIS — R64 Cachexia: Secondary | ICD-10-CM | POA: Diagnosis not present

## 2021-11-17 DIAGNOSIS — K668 Other specified disorders of peritoneum: Secondary | ICD-10-CM

## 2021-11-17 DIAGNOSIS — Z0189 Encounter for other specified special examinations: Secondary | ICD-10-CM

## 2021-11-17 DIAGNOSIS — D62 Acute posthemorrhagic anemia: Secondary | ICD-10-CM | POA: Diagnosis not present

## 2021-11-17 DIAGNOSIS — M069 Rheumatoid arthritis, unspecified: Secondary | ICD-10-CM | POA: Diagnosis present

## 2021-11-17 DIAGNOSIS — E876 Hypokalemia: Secondary | ICD-10-CM | POA: Diagnosis not present

## 2021-11-17 DIAGNOSIS — M19071 Primary osteoarthritis, right ankle and foot: Secondary | ICD-10-CM | POA: Diagnosis present

## 2021-11-17 DIAGNOSIS — R4182 Altered mental status, unspecified: Secondary | ICD-10-CM

## 2021-11-17 DIAGNOSIS — Z931 Gastrostomy status: Secondary | ICD-10-CM

## 2021-11-17 DIAGNOSIS — Z7189 Other specified counseling: Secondary | ICD-10-CM | POA: Diagnosis not present

## 2021-11-17 DIAGNOSIS — G4700 Insomnia, unspecified: Secondary | ICD-10-CM | POA: Diagnosis present

## 2021-11-17 DIAGNOSIS — K529 Noninfective gastroenteritis and colitis, unspecified: Secondary | ICD-10-CM | POA: Diagnosis not present

## 2021-11-17 DIAGNOSIS — R627 Adult failure to thrive: Secondary | ICD-10-CM | POA: Diagnosis present

## 2021-11-17 DIAGNOSIS — R1031 Right lower quadrant pain: Secondary | ICD-10-CM | POA: Diagnosis not present

## 2021-11-17 DIAGNOSIS — Z79899 Other long term (current) drug therapy: Secondary | ICD-10-CM

## 2021-11-17 DIAGNOSIS — G9389 Other specified disorders of brain: Secondary | ICD-10-CM | POA: Diagnosis not present

## 2021-11-17 DIAGNOSIS — Z515 Encounter for palliative care: Secondary | ICD-10-CM | POA: Diagnosis not present

## 2021-11-17 DIAGNOSIS — Z6823 Body mass index (BMI) 23.0-23.9, adult: Secondary | ICD-10-CM | POA: Diagnosis not present

## 2021-11-17 DIAGNOSIS — R2990 Unspecified symptoms and signs involving the nervous system: Secondary | ICD-10-CM | POA: Diagnosis not present

## 2021-11-17 DIAGNOSIS — F32A Depression, unspecified: Secondary | ICD-10-CM | POA: Diagnosis present

## 2021-11-17 DIAGNOSIS — M1712 Unilateral primary osteoarthritis, left knee: Secondary | ICD-10-CM | POA: Diagnosis not present

## 2021-11-17 DIAGNOSIS — M19031 Primary osteoarthritis, right wrist: Secondary | ICD-10-CM | POA: Diagnosis present

## 2021-11-17 DIAGNOSIS — R29818 Other symptoms and signs involving the nervous system: Secondary | ICD-10-CM | POA: Diagnosis not present

## 2021-11-17 DIAGNOSIS — Z87891 Personal history of nicotine dependence: Secondary | ICD-10-CM

## 2021-11-17 DIAGNOSIS — Z20822 Contact with and (suspected) exposure to covid-19: Secondary | ICD-10-CM | POA: Diagnosis not present

## 2021-11-17 DIAGNOSIS — M79605 Pain in left leg: Secondary | ICD-10-CM | POA: Diagnosis not present

## 2021-11-17 DIAGNOSIS — R509 Fever, unspecified: Secondary | ICD-10-CM | POA: Diagnosis not present

## 2021-11-17 DIAGNOSIS — Z4682 Encounter for fitting and adjustment of non-vascular catheter: Secondary | ICD-10-CM | POA: Diagnosis not present

## 2021-11-17 DIAGNOSIS — R000 Tachycardia, unspecified: Secondary | ICD-10-CM | POA: Diagnosis not present

## 2021-11-17 DIAGNOSIS — K5900 Constipation, unspecified: Secondary | ICD-10-CM | POA: Diagnosis present

## 2021-11-17 DIAGNOSIS — M170 Bilateral primary osteoarthritis of knee: Secondary | ICD-10-CM | POA: Diagnosis present

## 2021-11-17 DIAGNOSIS — Z4659 Encounter for fitting and adjustment of other gastrointestinal appliance and device: Secondary | ICD-10-CM

## 2021-11-17 DIAGNOSIS — K5730 Diverticulosis of large intestine without perforation or abscess without bleeding: Secondary | ICD-10-CM | POA: Diagnosis not present

## 2021-11-17 DIAGNOSIS — R69 Illness, unspecified: Secondary | ICD-10-CM | POA: Diagnosis not present

## 2021-11-17 DIAGNOSIS — R531 Weakness: Secondary | ICD-10-CM | POA: Diagnosis not present

## 2021-11-17 HISTORY — PX: PARTIAL COLECTOMY: SHX5273

## 2021-11-17 HISTORY — PX: LAPAROTOMY: SHX154

## 2021-11-17 LAB — CBC WITH DIFFERENTIAL/PLATELET
Abs Immature Granulocytes: 0.08 10*3/uL — ABNORMAL HIGH (ref 0.00–0.07)
Basophils Absolute: 0 10*3/uL (ref 0.0–0.1)
Basophils Relative: 0 %
Eosinophils Absolute: 0 10*3/uL (ref 0.0–0.5)
Eosinophils Relative: 0 %
HCT: 37.9 % (ref 36.0–46.0)
Hemoglobin: 12.7 g/dL (ref 12.0–15.0)
Immature Granulocytes: 1 %
Lymphocytes Relative: 4 %
Lymphs Abs: 0.6 10*3/uL — ABNORMAL LOW (ref 0.7–4.0)
MCH: 32 pg (ref 26.0–34.0)
MCHC: 33.5 g/dL (ref 30.0–36.0)
MCV: 95.5 fL (ref 80.0–100.0)
Monocytes Absolute: 1.4 10*3/uL — ABNORMAL HIGH (ref 0.1–1.0)
Monocytes Relative: 10 %
Neutro Abs: 11.4 10*3/uL — ABNORMAL HIGH (ref 1.7–7.7)
Neutrophils Relative %: 85 %
Platelets: 340 10*3/uL (ref 150–400)
RBC: 3.97 MIL/uL (ref 3.87–5.11)
RDW: 14.3 % (ref 11.5–15.5)
WBC: 13.5 10*3/uL — ABNORMAL HIGH (ref 4.0–10.5)
nRBC: 0 % (ref 0.0–0.2)

## 2021-11-17 LAB — COMPREHENSIVE METABOLIC PANEL
ALT: 14 U/L (ref 0–44)
AST: 28 U/L (ref 15–41)
Albumin: 3.9 g/dL (ref 3.5–5.0)
Alkaline Phosphatase: 68 U/L (ref 38–126)
Anion gap: 10 (ref 5–15)
BUN: 20 mg/dL (ref 8–23)
CO2: 28 mmol/L (ref 22–32)
Calcium: 10.7 mg/dL — ABNORMAL HIGH (ref 8.9–10.3)
Chloride: 101 mmol/L (ref 98–111)
Creatinine, Ser: 0.5 mg/dL (ref 0.44–1.00)
GFR, Estimated: >60 mL/min (ref >60)
Glucose, Bld: 118 mg/dL — ABNORMAL HIGH (ref 70–99)
Potassium: 4.2 mmol/L (ref 3.5–5.1)
Sodium: 139 mmol/L (ref 135–145)
Total Bilirubin: 1.7 mg/dL — ABNORMAL HIGH (ref 0.3–1.2)
Total Protein: 7.7 g/dL (ref 6.5–8.1)

## 2021-11-17 LAB — URINALYSIS, ROUTINE W REFLEX MICROSCOPIC
Bacteria, UA: NONE SEEN
Bilirubin Urine: NEGATIVE
Glucose, UA: NEGATIVE mg/dL
Ketones, ur: 20 mg/dL — AB
Nitrite: NEGATIVE
Protein, ur: NEGATIVE mg/dL
Specific Gravity, Urine: <1.005 — ABNORMAL LOW (ref 1.005–1.030)
pH: 5 (ref 5.0–8.0)

## 2021-11-17 LAB — RESP PANEL BY RT-PCR (FLU A&B, COVID) ARPGX2
Influenza A by PCR: NEGATIVE
Influenza B by PCR: NEGATIVE
SARS Coronavirus 2 by RT PCR: NEGATIVE

## 2021-11-17 LAB — LIPASE, BLOOD: Lipase: 22 U/L (ref 11–51)

## 2021-11-17 SURGERY — LAPAROTOMY, EXPLORATORY
Anesthesia: General | Site: Abdomen

## 2021-11-17 MED ORDER — MIDAZOLAM HCL 2 MG/2ML IJ SOLN
INTRAMUSCULAR | Status: AC
  Filled 2021-11-17: qty 2

## 2021-11-17 MED ORDER — PHENYLEPHRINE HCL (PRESSORS) 10 MG/ML IV SOLN
INTRAVENOUS | Status: AC
  Filled 2021-11-17: qty 2

## 2021-11-17 MED ORDER — ENOXAPARIN SODIUM 40 MG/0.4ML IJ SOSY
40.0000 mg | PREFILLED_SYRINGE | INTRAMUSCULAR | Status: DC
  Administered 2021-11-18 – 2022-01-22 (×66): 40 mg via SUBCUTANEOUS
  Filled 2021-11-17 (×65): qty 0.4

## 2021-11-17 MED ORDER — ORAL CARE MOUTH RINSE
15.0000 mL | Freq: Two times a day (BID) | OROMUCOSAL | Status: DC
  Administered 2021-11-18 – 2022-02-05 (×84): 15 mL via OROMUCOSAL

## 2021-11-17 MED ORDER — ONDANSETRON HCL 4 MG/2ML IJ SOLN: 4.0000 mg | Freq: Four times a day (QID) | INTRAMUSCULAR | Status: DC | PRN

## 2021-11-17 MED ORDER — 0.9 % SODIUM CHLORIDE (POUR BTL) OPTIME
TOPICAL | Status: DC | PRN
  Administered 2021-11-17: 15:00:00 4000 mL

## 2021-11-17 MED ORDER — PANTOPRAZOLE SODIUM 40 MG IV SOLR
40.0000 mg | Freq: Every day | INTRAVENOUS | Status: DC
  Administered 2021-11-17 – 2021-11-23 (×7): 40 mg via INTRAVENOUS
  Filled 2021-11-17 (×7): qty 40

## 2021-11-17 MED ORDER — LACTATED RINGERS IV SOLN: INTRAVENOUS | Status: DC | PRN

## 2021-11-17 MED ORDER — PROPOFOL 10 MG/ML IV BOLUS
INTRAVENOUS | Status: AC
  Filled 2021-11-17: qty 20

## 2021-11-17 MED ORDER — FENTANYL CITRATE PF 50 MCG/ML IJ SOSY
PREFILLED_SYRINGE | INTRAMUSCULAR | Status: AC
  Administered 2021-11-17: 17:00:00 50 ug
  Filled 2021-11-17: qty 3

## 2021-11-17 MED ORDER — FENTANYL CITRATE (PF) 100 MCG/2ML IJ SOLN
INTRAMUSCULAR | Status: DC | PRN
  Administered 2021-11-17 (×3): 50 ug via INTRAVENOUS

## 2021-11-17 MED ORDER — ONDANSETRON 4 MG PO TBDP: 4.0000 mg | ORAL_TABLET | Freq: Four times a day (QID) | ORAL | Status: DC | PRN

## 2021-11-17 MED ORDER — SODIUM CHLORIDE 0.9 % IV SOLN
INTRAVENOUS | Status: DC | PRN
  Administered 2021-11-17: 19:00:00 500 mL via INTRAVENOUS

## 2021-11-17 MED ORDER — PHENYLEPHRINE HCL-NACL 20-0.9 MG/250ML-% IV SOLN
INTRAVENOUS | Status: DC | PRN
  Administered 2021-11-17: 25 ug/min via INTRAVENOUS

## 2021-11-17 MED ORDER — PIPERACILLIN-TAZOBACTAM 3.375 G IVPB
3.3750 g | Freq: Three times a day (TID) | INTRAVENOUS | Status: AC
  Administered 2021-11-17 – 2021-11-22 (×15): 3.375 g via INTRAVENOUS
  Filled 2021-11-17 (×14): qty 50

## 2021-11-17 MED ORDER — ONDANSETRON HCL 4 MG/2ML IJ SOLN
4.0000 mg | Freq: Once | INTRAMUSCULAR | Status: AC
  Administered 2021-11-17: 10:00:00 4 mg via INTRAVENOUS
  Filled 2021-11-17: qty 2

## 2021-11-17 MED ORDER — CHLORHEXIDINE GLUCONATE 0.12 % MT SOLN
15.0000 mL | Freq: Two times a day (BID) | OROMUCOSAL | Status: DC
  Administered 2021-11-17 – 2022-02-05 (×117): 15 mL via OROMUCOSAL
  Filled 2021-11-17 (×119): qty 15

## 2021-11-17 MED ORDER — OXYCODONE HCL 5 MG/5ML PO SOLN: 5.0000 mg | ORAL | Status: DC | PRN

## 2021-11-17 MED ORDER — KCL IN DEXTROSE-NACL 20-5-0.45 MEQ/L-%-% IV SOLN
INTRAVENOUS | Status: DC
  Filled 2021-11-17 (×16): qty 1000

## 2021-11-17 MED ORDER — SUCCINYLCHOLINE CHLORIDE 200 MG/10ML IV SOSY
PREFILLED_SYRINGE | INTRAVENOUS | Status: DC | PRN
  Administered 2021-11-17: 140 mg via INTRAVENOUS

## 2021-11-17 MED ORDER — MIDAZOLAM HCL 5 MG/5ML IJ SOLN
INTRAMUSCULAR | Status: DC | PRN
  Administered 2021-11-17: 1 mg via INTRAVENOUS

## 2021-11-17 MED ORDER — MORPHINE SULFATE (PF) 4 MG/ML IV SOLN
4.0000 mg | Freq: Once | INTRAVENOUS | Status: AC
  Administered 2021-11-17: 10:00:00 4 mg via INTRAVENOUS
  Filled 2021-11-17: qty 1

## 2021-11-17 MED ORDER — PIPERACILLIN-TAZOBACTAM 3.375 G IVPB 30 MIN
3.3750 g | Freq: Once | INTRAVENOUS | Status: AC
  Administered 2021-11-17: 13:00:00 3.375 g via INTRAVENOUS
  Filled 2021-11-17: qty 50

## 2021-11-17 MED ORDER — PROPOFOL 10 MG/ML IV BOLUS
INTRAVENOUS | Status: DC | PRN
  Administered 2021-11-17: 170 mg via INTRAVENOUS

## 2021-11-17 MED ORDER — IOHEXOL 350 MG/ML SOLN
80.0000 mL | Freq: Once | INTRAVENOUS | Status: AC | PRN
  Administered 2021-11-17: 11:00:00 80 mL via INTRAVENOUS

## 2021-11-17 MED ORDER — LIDOCAINE 2% (20 MG/ML) 5 ML SYRINGE
INTRAMUSCULAR | Status: DC | PRN
Start: 2021-11-17 — End: 2021-11-17
  Administered 2021-11-17: 60 mg via INTRAVENOUS

## 2021-11-17 MED ORDER — FENTANYL CITRATE (PF) 100 MCG/2ML IJ SOLN
INTRAMUSCULAR | Status: AC
  Filled 2021-11-17: qty 2

## 2021-11-17 MED ORDER — HYDROMORPHONE HCL 1 MG/ML IJ SOLN
1.0000 mg | INTRAMUSCULAR | Status: DC | PRN
  Administered 2021-11-17: 18:00:00 1 mg via INTRAVENOUS
  Filled 2021-11-17: qty 1

## 2021-11-17 MED ORDER — PHENYLEPHRINE 40 MCG/ML (10ML) SYRINGE FOR IV PUSH (FOR BLOOD PRESSURE SUPPORT)
PREFILLED_SYRINGE | INTRAVENOUS | Status: DC | PRN
  Administered 2021-11-17: 120 ug via INTRAVENOUS

## 2021-11-17 MED ORDER — ACETAMINOPHEN 325 MG PO TABS: 650.0000 mg | ORAL_TABLET | Freq: Four times a day (QID) | ORAL | Status: DC | PRN

## 2021-11-17 MED ORDER — ACETAMINOPHEN 650 MG RE SUPP: 650.0000 mg | Freq: Four times a day (QID) | RECTAL | Status: DC | PRN

## 2021-11-17 MED ORDER — SUGAMMADEX SODIUM 200 MG/2ML IV SOLN
INTRAVENOUS | Status: DC | PRN
Start: 2021-11-17 — End: 2021-11-17
  Administered 2021-11-17: 200 mg via INTRAVENOUS

## 2021-11-17 MED ORDER — FENTANYL CITRATE PF 50 MCG/ML IJ SOSY
25.0000 ug | PREFILLED_SYRINGE | INTRAMUSCULAR | Status: DC | PRN
  Administered 2021-11-17: 17:00:00 50 ug via INTRAVENOUS

## 2021-11-17 MED ORDER — CHLORHEXIDINE GLUCONATE CLOTH 2 % EX PADS
6.0000 | MEDICATED_PAD | Freq: Every day | CUTANEOUS | Status: DC
  Administered 2021-11-18 – 2021-11-29 (×11): 6 via TOPICAL

## 2021-11-17 MED ORDER — TRAMADOL HCL 50 MG PO TABS: 50.0000 mg | ORAL_TABLET | Freq: Four times a day (QID) | ORAL | Status: DC | PRN

## 2021-11-17 MED ORDER — ROCURONIUM BROMIDE 10 MG/ML (PF) SYRINGE
PREFILLED_SYRINGE | INTRAVENOUS | Status: DC | PRN
  Administered 2021-11-17: 70 mg via INTRAVENOUS

## 2021-11-17 MED ORDER — 0.9 % SODIUM CHLORIDE (POUR BTL) OPTIME
TOPICAL | Status: DC | PRN
  Administered 2021-11-17 (×3): 1000 mL

## 2021-11-17 MED ORDER — SODIUM CHLORIDE 0.9 % IV BOLUS
1000.0000 mL | Freq: Once | INTRAVENOUS | Status: AC
  Administered 2021-11-17: 10:00:00 1000 mL via INTRAVENOUS

## 2021-11-17 SURGICAL SUPPLY — 58 items
APPLICATOR COTTON TIP 6 STRL (MISCELLANEOUS) ×2 IMPLANT
APPLICATOR COTTON TIP 6IN STRL (MISCELLANEOUS) ×3 IMPLANT
BAG COUNTER SPONGE SURGICOUNT (BAG) IMPLANT
BLADE EXTENDED COATED 6.5IN (ELECTRODE) IMPLANT
BLADE HEX COATED 2.75 (ELECTRODE) ×3 IMPLANT
BNDG GAUZE ELAST 4 BULKY (GAUZE/BANDAGES/DRESSINGS) ×6 IMPLANT
CHLORAPREP W/TINT 26 (MISCELLANEOUS) ×6 IMPLANT
COVER MAYO STAND STRL (DRAPES) IMPLANT
COVER SURGICAL LIGHT HANDLE (MISCELLANEOUS) ×6 IMPLANT
DRAPE LAPAROSCOPIC ABDOMINAL (DRAPES) ×3 IMPLANT
DRAPE WARM FLUID 44X44 (DRAPES) IMPLANT
DRSG OPSITE POSTOP 4X10 (GAUZE/BANDAGES/DRESSINGS) IMPLANT
DRSG OPSITE POSTOP 4X6 (GAUZE/BANDAGES/DRESSINGS) IMPLANT
DRSG OPSITE POSTOP 4X8 (GAUZE/BANDAGES/DRESSINGS) IMPLANT
DRSG PAD ABDOMINAL 8X10 ST (GAUZE/BANDAGES/DRESSINGS) ×6 IMPLANT
ELECT REM PT RETURN 15FT ADLT (MISCELLANEOUS) ×3 IMPLANT
GAUZE SPONGE 4X4 12PLY STRL (GAUZE/BANDAGES/DRESSINGS) ×3 IMPLANT
GLOVE SURG ORTHO LTX SZ8 (GLOVE) ×6 IMPLANT
GLOVE SURG SYN 7.5  E (GLOVE) ×1
GLOVE SURG SYN 7.5 E (GLOVE) ×2 IMPLANT
GLOVE SURG UNDER POLY LF SZ7 (GLOVE) ×3 IMPLANT
GOWN STRL REUS W/TWL LRG LVL3 (GOWN DISPOSABLE) ×3 IMPLANT
GOWN STRL REUS W/TWL XL LVL3 (GOWN DISPOSABLE) ×12 IMPLANT
HANDLE SUCTION POOLE (INSTRUMENTS) IMPLANT
KIT BASIN OR (CUSTOM PROCEDURE TRAY) ×3 IMPLANT
KIT TURNOVER KIT A (KITS) IMPLANT
LIGASURE IMPACT 36 18CM CVD LR (INSTRUMENTS) ×3 IMPLANT
NS IRRIG 1000ML POUR BTL (IV SOLUTION) ×3 IMPLANT
PACK COLON (CUSTOM PROCEDURE TRAY) ×3 IMPLANT
PACK GENERAL/GYN (CUSTOM PROCEDURE TRAY) ×3 IMPLANT
PENCIL SMOKE EVACUATOR (MISCELLANEOUS) IMPLANT
RELOAD PROXIMATE 75MM BLUE (ENDOMECHANICALS) ×3 IMPLANT
SPONGE T-LAP 18X18 ~~LOC~~+RFID (SPONGE) IMPLANT
STAPLER PROXIMATE 75MM BLUE (STAPLE) ×3 IMPLANT
STAPLER VISISTAT 35W (STAPLE) ×3 IMPLANT
SUCTION POOLE HANDLE (INSTRUMENTS)
SUT NOV 1 T60/GS (SUTURE) IMPLANT
SUT NOVA NAB DX-16 0-1 5-0 T12 (SUTURE) IMPLANT
SUT NOVA T20/GS 25 (SUTURE) ×6 IMPLANT
SUT PDS AB 1 TP1 96 (SUTURE) IMPLANT
SUT SILK 2 0 (SUTURE) ×2
SUT SILK 2 0 SH CR/8 (SUTURE) ×3 IMPLANT
SUT SILK 2 0SH CR/8 30 (SUTURE) IMPLANT
SUT SILK 2-0 18XBRD TIE 12 (SUTURE) ×2 IMPLANT
SUT SILK 2-0 30XBRD TIE 12 (SUTURE) ×2 IMPLANT
SUT SILK 3 0 (SUTURE) ×1
SUT SILK 3 0 SH CR/8 (SUTURE) ×3 IMPLANT
SUT SILK 3-0 18XBRD TIE 12 (SUTURE) ×2 IMPLANT
SUT VIC AB 3-0 SH 8-18 (SUTURE) ×6 IMPLANT
SUT VICRYL 2 0 18  UND BR (SUTURE)
SUT VICRYL 2 0 18 UND BR (SUTURE) IMPLANT
TAPE CLOTH SURG 6X10 WHT LF (GAUZE/BANDAGES/DRESSINGS) ×3 IMPLANT
TOWEL OR 17X26 10 PK STRL BLUE (TOWEL DISPOSABLE) ×6 IMPLANT
TOWEL OR NON WOVEN STRL DISP B (DISPOSABLE) ×6 IMPLANT
TRAY FOLEY MTR SLVR 16FR STAT (SET/KITS/TRAYS/PACK) ×3 IMPLANT
TUBING CONNECTING 10 (TUBING) ×6 IMPLANT
WATER STERILE IRR 1000ML POUR (IV SOLUTION) ×3 IMPLANT
YANKAUER SUCT BULB TIP NO VENT (SUCTIONS) IMPLANT

## 2021-11-17 NOTE — Anesthesia Preprocedure Evaluation (Addendum)
Anesthesia Evaluation  Patient identified by MRN, date of birth, ID band Patient awake    Reviewed: Allergy & Precautions, NPO status , Patient's Chart, lab work & pertinent test results  Airway Mallampati: II       Dental   Pulmonary neg pulmonary ROS, former smoker,    breath sounds clear to auscultation       Cardiovascular negative cardio ROS   Rhythm:Regular Rate:Normal     Neuro/Psych    GI/Hepatic Neg liver ROS, GERD  ,History noted Dr. Nyoka Cowden   Endo/Other  negative endocrine ROS  Renal/GU negative Renal ROS     Musculoskeletal   Abdominal   Peds  Hematology   Anesthesia Other Findings   Reproductive/Obstetrics                             Anesthesia Physical Anesthesia Plan  ASA: 3  Anesthesia Plan: General   Post-op Pain Management:    Induction: Intravenous  PONV Risk Score and Plan: 3 and Ondansetron, Dexamethasone and Midazolam  Airway Management Planned: Oral ETT  Additional Equipment:   Intra-op Plan:   Post-operative Plan: Possible Post-op intubation/ventilation  Informed Consent: I have reviewed the patients History and Physical, chart, labs and discussed the procedure including the risks, benefits and alternatives for the proposed anesthesia with the patient or authorized representative who has indicated his/her understanding and acceptance.     Dental advisory given  Plan Discussed with: CRNA, Anesthesiologist and Surgeon  Anesthesia Plan Comments:         Anesthesia Quick Evaluation

## 2021-11-17 NOTE — Progress Notes (Signed)
Pharmacy Antibiotic Note  Sherry Hampton is a 65 y.o. female admitted on 11/17/2021 with  perforated viscus , planning for emergent surgery today. Pharmacy has been consulted for piperacillin/tazobactam dosing.  Today, 11/17/21 WBC elevated SCr low. No weight on file  Afebrile  Plan: Piperacillin/tazobactam 3.375 g IV once over 30 mins in ED followed by 3.375 g IV q8h EI Ht/wt order entered  Renal function WNL. Pharmacy to sign off. Please re-consult if needed.     Temp (24hrs), Avg:98.2 F (36.8 C), Min:98.2 F (36.8 C), Max:98.2 F (36.8 C)  Recent Labs  Lab 11/17/21 0955  WBC 13.5*  CREATININE 0.50    CrCl cannot be calculated (Unknown ideal weight.).    No Known Allergies  Antimicrobials this admission: Piperacillin/tazobactam 11/27 >>   Dose adjustments this admission:   Microbiology results: None  Thank you for allowing pharmacy to be a part of this patient's care.  Lenis Noon, PharmD 11/17/2021 2:11 PM

## 2021-11-17 NOTE — ED Triage Notes (Signed)
Patient complains of RLQ abd pain x1 week, N/V/D x2 weeks. Reports her urine has been dark and she has urgency and frequency. Denies dysuria. Reports one fever, unsure of actual temperature. Pt reports generalized fatigue and weakness.

## 2021-11-17 NOTE — Op Note (Signed)
Operative Note  Pre-operative Diagnosis:  perforated viscus  Post-operative Diagnosis:  perforated ascending colon with fecal peritonitis  Surgeon:  [name], MD  Assistant:  none   Procedure:  Exploratory laparotomy, extended right colectomy, end ileostomy [name]), and mucous fistula (distal transverse colon)  Upon entering the abdomen (organ space), I encountered feculent peritonitis.  CASE DATA:  Type of patient?: LDOW CASE (Surgical Hospitalist [facility] Inpatient)  Status of Case? EMERGENT Add On  Infection Present At Time Of Surgery (PATOS)?  FECULENT PERITONITIS   Anesthesia:  general  Estimated Blood Loss:  75 cc  Drains: none         Specimen: right colon to pathology  Indications: Patient is a 65 year old female who presented to the emergency department with abdominal pain.  Evaluation included significant leukocytosis and a tender abdomen.  Patient underwent CT scan of the abdomen and pelvis showing large volume of pneumoperitoneum.  Suspected right colonic perforation was identified.  Distal sigmoid narrowing and partial obstruction was also identified.  General surgery was urgently consulted and the patient was prepared and brought to the operating room for exploration.  Procedure:  The patient was seen in the pre-op holding area. The risks, benefits, complications, treatment options, and expected outcomes were previously discussed with the patient. The patient agreed with the proposed plan and has signed the informed consent form.  The patient was brought to the operating room by the surgical team, identified as [name] and the procedure verified. A "time out" was completed and the above information confirmed.  Following induction of general anesthesia the patient was positioned and then prepped and draped in usual aseptic fashion.  After ascertaining that an adequate level of anesthesia been achieved, a midline abdominal incision is made with a #10 blade.   Dissection is carried through subcutaneous tissues and hemostasis achieved with the electrocautery.  Fascia was incised in the midline and the peritoneal cavity is entered cautiously.  Upon entering the peritoneal cavity, there is a foul odor.  There is gross contamination of the peritoneal cavity with feculent material and creamy yellow purulent material.  Incision is widely opened.  The omentum is densely adherent in the pelvis.  It is adherent to the lower anterior abdominal wall.  Using the LigaSure the distal omentum is divided allowing for mobilization of the omentum out of the pelvis exposing the underlying small intestine.  A pool sucker is used to remove as much of the fluid and debris and feculent material as possible.  Next the right colon is inspected.  There is a large approximately 3 to 4 cm in diameter hole in the anterior medial portion of the mid ascending colon.  The terminal ileum and cecum were mobilized.  Appendix is present.  The lateral peritoneal attachments are incised.  The very distal ileum is dissected out and transected with a GIA stapler.  The right colon mesentery is then divided with the LigaSure.  The hepatic flexure is mobilized using a ligature to divide adhesions to the undersurface of the liver.  There is secondary involvement of the proximal transverse colon with the area where the perforation occurred, likely representing an abscess cavity.  The ascending colon mesentery and transverse colon mesentery are mobilized.  Care is taken to avoid the duodenum which is visualized posteriorly.  The mesentery is then divided with the LigaSure.  The right colic artery and middle colic artery are identified and divided between [facility] clamps with the LigaSure and then suture-ligated with 2-0 silk suture  ligature.  Dissection is carried across the midline and the transverse colon is then transected with a GIA stapler.  The extended right colectomy specimen is then passed off the  field.  Next the abdominal cavity is irrigated copiously with warm saline which is evacuated.  Good hemostasis is noted throughout the abdomen.  Palpation of the sigmoid colon shows it to have inflammatory type changes likely representing diverticular disease.  There are densely adherent adipose tissue and omentum.  There is an area in the mid to distal sigmoid colon which appears quite firm and no attempt is made at mobilization.  The distal ileum is somewhat adherent in the pelvis.  This requires lysis of adhesions in order to free the distal ileum enough from the pelvis to allow the bowel to be brought up for an ileostomy.  A nasogastric tube had been placed by anesthesia into the stomach.  Positioning is confirmed.  Next an elliptical incision is made in the left mid abdomen.  Adipose tissue is excised.  The abdominal wall is incised in a cruciate fashion and the peritoneal cavity entered.  Using a Babcock clamp the stapled end of the distal transverse colon is brought through the abdominal wall.  An elliptical incision is made in the right lower quadrant.  Adipose tissue was excised.  The musculature is incised in a cruciate fashion and the peritoneal cavity entered.  Using a Babcock clamp the stapled end of the terminal ileum is brought through the abdominal wall.  The midline abdominal incision is then closed with running #1 Novafil sutures.  Subcutaneous tissues are irrigated.  Subcutaneous tissues are packed with Betadine soaked Kerlix gauze and will be covered with dry gauze and ABD pads.  The mucous fistula in the left mid abdominal wall is then matured by excising a portion of the staple line and maturing the edges of the bowel to the dermis circumferentially with interrupted 3-0 Vicryl sutures.  And ileostomy is created in the fashion of [place] by excising the staple line and securing the bowel to the dermis circumferentially with interrupted 3-0 Vicryl sutures.  Ostomy appliances are  placed over the mucous fistula and the ileostomy.  Patient is awakened from anesthesia and transported to the recovery room in stable condition.  The patient tolerated the procedure well.  [name], [facility] Office: [phone]

## 2021-11-17 NOTE — H&P (Signed)
Sherry Hampton is an 65 y.o. female.    Chief Complaint: perforated viscus  HPI: Patient is a 65 year old female who presents to the emergency department with sudden onset of abdominal pain and distention.  White blood cell count was elevated at 13,000.  CT scan of abdomen and pelvis shows findings consistent with free air with a large pneumoperitoneum.  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  There is also noted a change in the caliber of the colon in the distal sigmoid colon possibly representing neoplasm or stricture.  There is evidence of diverticulosis.  Patient has no prior history of colonic disease.  Previous abdominal surgery includes an oophorectomy.  Patient does have a history of rheumatoid arthritis and is followed by her rheumatologist.  She is on methotrexate.  She is not on steroids.  General surgery is now consulted for management.  Patient is from [place].  She has lived in [place] for approximately 10 years.  She does not have any family here.  Past Medical History:  Diagnosis Date   Anemia    past hx    Arthritis    wrists , knees,  feet    Cataract    cordical cataracts per pt    Constipation    takes a stool softener  daily- as long as does this has daily soft stools    GERD (gastroesophageal reflux disease)    Hemorrhoids    Hyperlipidemia     Past Surgical History:  Procedure Laterality Date   CYSTECTOMY  1972   left buttock with spinal    HEMORRHOID BANDING  02/2015   [facility]    LAPAROSCOPIC SALPINGO OOPHERECTOMY Left 07/1985   right repair     Family History  Problem Relation Age of Onset   Colon polyps Father    Esophageal cancer Brother    Colon cancer Paternal Grandmother    Rectal cancer Neg Hx    Stomach cancer Neg Hx    Pancreatic cancer Neg Hx    Social History:  reports that she has quit smoking. She has never used smokeless tobacco. She reports that she does not drink alcohol and does not use  drugs.  Allergies: No Known Allergies  (Not in a hospital admission)   Results for orders placed or performed during the hospital encounter of 11/17/21 (from the past 48 hour(s))  Comprehensive metabolic panel     Status: Abnormal   Collection Time: 11/17/21  9:55 AM  Result Value Ref Range   Sodium 139 135 - 145 mmol/L   Potassium 4.2 3.5 - 5.1 mmol/L   Chloride 101 98 - 111 mmol/L   CO2 28 22 - 32 mmol/L   Glucose, Bld 118 (H) 70 - 99 mg/dL    Comment: Glucose reference range applies only to samples taken after fasting for at least 8 hours.   BUN 20 8 - 23 mg/dL   Creatinine, Ser 0.50 0.44 - 1.00 mg/dL   Calcium 10.7 (H) 8.9 - 10.3 mg/dL   Total Protein 7.7 6.5 - 8.1 g/dL   Albumin 3.9 3.5 - 5.0 g/dL   AST 28 15 - 41 U/L   ALT 14 0 - 44 U/L   Alkaline Phosphatase 68 38 - 126 U/L   Total Bilirubin 1.7 (H) 0.3 - 1.2 mg/dL   GFR, Estimated >60 >60 mL/min    Comment: (NOTE) Calculated using the CKD-EPI Creatinine Equation (2021)    Anion gap 10 5 -  15    Comment: Performed at [facility], [place] [address] [place], [place] [address]  Lipase, blood     Status: None   Collection Time: 11/17/21  9:55 AM  Result Value Ref Range   Lipase 22 11 - 51 U/L    Comment: Performed at [facility], [place] [address], [place], [place] [address]  CBC with Diff     Status: Abnormal   Collection Time: 11/17/21  9:55 AM  Result Value Ref Range   WBC 13.5 (H) 4.0 - 10.5 K/uL   RBC 3.97 3.87 - 5.11 MIL/uL   Hemoglobin 12.7 12.0 - 15.0 g/dL   HCT 37.9 36.0 - 46.0 %   MCV 95.5 80.0 - 100.0 fL   MCH 32.0 26.0 - 34.0 pg   MCHC 33.5 30.0 - 36.0 g/dL   RDW 14.3 11.5 - 15.5 %   Platelets 340 150 - 400 K/uL   nRBC 0.0 0.0 - 0.2 %   Neutrophils Relative % 85 %   Neutro Abs 11.4 (H) 1.7 - 7.7 K/uL   Lymphocytes Relative 4 %   Lymphs Abs 0.6 (L) 0.7 - 4.0 K/uL   Monocytes Relative 10 %   Monocytes Absolute 1.4 (H) 0.1 - 1.0 K/uL   Eosinophils Relative 0 %    Eosinophils Absolute 0.0 0.0 - 0.5 K/uL   Basophils Relative 0 %   Basophils Absolute 0.0 0.0 - 0.1 K/uL   Immature Granulocytes 1 %   Abs Immature Granulocytes 0.08 (H) 0.00 - 0.07 K/uL    Comment: Performed at [facility], [place] [address], [place], [place] [address]  Resp Panel by RT-PCR (Flu A&B, Covid) Nasopharyngeal Swab     Status: None   Collection Time: 11/17/21 12:08 PM   Specimen: Nasopharyngeal Swab; Nasopharyngeal(NP) swabs in vial transport medium  Result Value Ref Range   SARS Coronavirus 2 by RT PCR NEGATIVE NEGATIVE    Comment: (NOTE) SARS-CoV-2 target nucleic acids are NOT DETECTED.  The SARS-CoV-2 RNA is generally detectable in upper respiratory specimens during the acute phase of infection. The lowest concentration of SARS-CoV-2 viral copies this assay can detect is 138 copies/mL. A negative result does not preclude SARS-Cov-2 infection and should not be used as the sole basis for treatment or other patient management decisions. A negative result may occur with  improper specimen collection/handling, submission of specimen other than nasopharyngeal swab, presence of viral mutation(s) within the areas targeted by this assay, and inadequate number of viral copies(<138 copies/mL). A negative result must be combined with clinical observations, patient history, and epidemiological information. The expected result is Negative.  Fact Sheet for Patients:  EntrepreneurPulse.com.au  Fact Sheet for Healthcare Providers:  IncredibleEmployment.be  This test is no t yet approved or cleared by the [place] FDA and  has been authorized for detection and/or diagnosis of SARS-CoV-2 by FDA under an Emergency Use Authorization (EUA). This EUA will remain  in effect (meaning this test can be used) for the duration of the COVID-19 declaration under Section 564(b)(1) of the Act, 21 U.S.C.section 360bbb-3(b)(1), unless the  authorization is terminated  or revoked sooner.       Influenza A by PCR NEGATIVE NEGATIVE   Influenza B by PCR NEGATIVE NEGATIVE    Comment: (NOTE) The Xpert Xpress SARS-CoV-2/FLU/RSV plus assay is intended as an aid in the diagnosis of influenza from Nasopharyngeal swab specimens and should not be used as a sole basis for treatment. Nasal washings and  aspirates are unacceptable for Xpert Xpress SARS-CoV-2/FLU/RSV testing.  Fact Sheet for Patients: EntrepreneurPulse.com.au  Fact Sheet for Healthcare Providers: IncredibleEmployment.be  This test is not yet approved or cleared by the [place] FDA and has been authorized for detection and/or diagnosis of SARS-CoV-2 by FDA under an Emergency Use Authorization (EUA). This EUA will remain in effect (meaning this test can be used) for the duration of the COVID-19 declaration under Section 564(b)(1) of the Act, 21 U.S.C. section 360bbb-3(b)(1), unless the authorization is terminated or revoked.  Performed at [facility], [place] [address], [place], [place] [address]    CT ABDOMEN PELVIS W CONTRAST  Result Date: 11/17/2021 CLINICAL DATA:  Right lower quadrant pain for 1 week EXAM: CT ABDOMEN AND PELVIS WITH CONTRAST TECHNIQUE: Multidetector CT imaging of the abdomen and pelvis was performed using the standard protocol following bolus administration of intravenous contrast. CONTRAST:  66mL OMNIPAQUE IOHEXOL 350 MG/ML SOLN COMPARISON:  None. FINDINGS: Lower chest:  Coronary atherosclerosis.  Dependent atelectasis. Hepatobiliary: No focal liver abnormality.No evidence of biliary obstruction or stone. Pancreas: Dilated main pancreatic duct measuring up to 4 mm. No evidence of pancreatitis or mass lesion. Spleen: Unremarkable. Adrenals/Urinary Tract: Negative adrenals. No hydronephrosis or ureteral stone. Small calculi at the lower pole left kidney measuring up to 4 mm. Subcentimeter  cysts. Unremarkable bladder. Stomach/Bowel: Large volume of generalized pneumoperitoneum. There is inflammation along the medial wall of the ascending colon where there is a pericolonic ovoid gas collection likely related to diverticula at this level. Colonic diverticulosis is generalized. 9 mm diameter appendix, but with gas intermittently seen in the lumen, reaching the tip. The colon is gas or fluid distended until and abrupt transition at the descending sigmoid junction. The sigmoid colon is diffusely affected by diverticula with lumen collapse. Vascular/Lymphatic: No acute vascular abnormality. No mass or adenopathy. Reproductive:Partially calcified uterine fibroid measuring up to 4.5 cm. Other: No ascites or pneumoperitoneum. 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 Critical Value/emergent results were called by telephone at the time of interpretation on 11/17/2021 at 11:58 am to provider MADISON REDWINE , who verbally acknowledged these results. IMPRESSION: 1. Large volume pneumoperitoneum likely arising from the medial wall of the ascending colon where there is inflammation and pericolonic gas collection. This may be diverticular given the extent of colonic diverticulosis. 2. Fluid and gas distended colon above an abrupt transition point at the level of the descending sigmoid junction, need workup for mass at this location. 3. Mildly dilated main pancreatic duct without visible cause. Suggest follow-up if no outside comparison. 4. Atherosclerosis including the coronary arteries. 5. Nonobstructing left renal calculi. Electronically Signed   By: Jorje Guild M.D.   On: 11/17/2021 12:00    Review of Systems  Constitutional: Negative.   HENT: Negative.    Eyes: Negative.   Respiratory: Negative.    Cardiovascular: Negative.   Gastrointestinal:  Positive for abdominal distention and abdominal pain.  Endocrine: Negative.   Genitourinary: Negative.   Musculoskeletal:  Positive for  arthralgias and joint swelling.  Skin: Negative.   Allergic/Immunologic: Negative.   Neurological: Negative.   Hematological: Negative.   Psychiatric/Behavioral: Negative.      Physical Exam   Blood pressure 130/68, pulse 88, temperature 98.2 F (36.8 C), temperature source Oral, resp. rate 17, SpO2 93 %.  CONSTITUTIONAL: no acute distress; conversant; no obvious deformities  EYES: conjunctiva moist; no lid lag; anicteric; pupils equal bilaterally  NECK: trachea midline; no thyroid nodularity  LUNGS: respiratory effort normal & unlabored;  no wheeze; no rales  CV: rate and rhythm regular;  no murmur; no edema bilat lower extremities  GI: abdomen is protuberant; well healed Pfannenstiehl incision; no obvious hernia; mild diffuse tenderness to palpation; no mass  MSK: normal range of motion of extremities; no clubbing; no cyanosis  PSYCH: appropriate affect for situation; alert and oriented to person, place, & time  LYMPHATIC: no palpable cervical lymphadenopathy; no evidence lymphedema in extremities    Assessment/Plan Perforated viscus  Suspect perforated ascending colon diverticulum based on CT scan findings  Possible distal sigmoid stricture vs neoplasm  IV Zosyn given in ER  Plan urgent operative intervention this afternoon.  Discussed exploratory laparotomy with the patient.  Discussed the fact that she will most likely have at least one ostomy which will need to remain in place for several months.  I am concerned that she has a perforation on the right side and possibly an obstructing lesion in the sigmoid colon.  She may require both an ostomy on the right and a mucous fistula on the left.  This will then require further evaluation and further operative intervention at some point in the future.  Patient understands the need for urgent operative intervention and agrees to proceed with surgery this afternoon.  The risks and benefits of the procedure have been  discussed at length with the patient.  The patient understands the proposed procedure, potential alternative treatments, and the course of recovery to be expected.  All of the patient's questions have been answered at this time.  The patient wishes to proceed with surgery.  [name], MD [facility] [place] [facility] Office: [phone]   [name], MD 11/17/2021, 1:25 PM

## 2021-11-17 NOTE — Transfer of Care (Signed)
Immediate Anesthesia Transfer of Care Note  Patient: Sherry Hampton  Procedure(s) Performed: Procedure(s): EXPLORATORY LAPAROTOMY, EXTENDED RIGHT COLECTOMY, END ILEOSTOMY, MUCOUS FISTULA (N/A) PARTIAL COLECTOMY (N/A)  Patient Location: PACU  Anesthesia Type:General  Level of Consciousness: Alert, Awake, Oriented  Airway & Oxygen Therapy: Patient Spontanous Breathing  Post-op Assessment: Report given to RN  Post vital signs: Reviewed and stable  Last Vitals:  Vitals:   11/17/21 1018 11/17/21 1152  BP: 117/72 130/68  Pulse: (!) 104 88  Resp: 18 17  Temp:    SpO2: 67% 12%    Complications: No apparent anesthesia complications

## 2021-11-17 NOTE — Progress Notes (Signed)
A consult was received from an ED provider for piperacillin/tazobactam per pharmacy dosing.  The patient's profile has been reviewed for ht/wt/allergies/indication/available labs.    No Known Allergies  A one time order has been placed for piperacillin/tazobactam 3.375 g IV once to be infused over 30 minutes.    Further antibiotics/pharmacy consults should be ordered by admitting physician if indicated.                       Thank you, Lenis Noon, PharmD 11/17/2021  12:20 PM

## 2021-11-17 NOTE — Anesthesia Postprocedure Evaluation (Signed)
Anesthesia Post Note  Patient: Engineer, maintenance  Procedure(s) Performed: EXPLORATORY LAPAROTOMY, EXTENDED RIGHT COLECTOMY, END ILEOSTOMY, MUCOUS FISTULA (Abdomen) PARTIAL COLECTOMY     Patient location during evaluation: PACU Anesthesia Type: General Level of consciousness: awake Pain management: pain level controlled Vital Signs Assessment: post-procedure vital signs reviewed and stable Respiratory status: spontaneous breathing Cardiovascular status: stable Postop Assessment: no apparent nausea or vomiting Anesthetic complications: no   No notable events documented.  Last Vitals:  Vitals:   11/17/21 1646 11/17/21 1650  BP: 138/79   Pulse: 86 86  Resp: 14 11  Temp: 36.8 C   SpO2: 99% 100%    Last Pain:  Vitals:   11/17/21 1640  TempSrc:   PainSc: 10-Worst pain ever                 Warren Lindahl

## 2021-11-17 NOTE — Anesthesia Procedure Notes (Signed)
Procedure Name: Intubation Date/Time: 11/17/2021 2:05 PM Performed by: Gerald Leitz, CRNA Pre-anesthesia Checklist: Patient identified, Patient being monitored, Timeout performed, Emergency Drugs available and Suction available Patient Re-evaluated:Patient Re-evaluated prior to induction Oxygen Delivery Method: Circle system utilized Preoxygenation: Pre-oxygenation with 100% oxygen Induction Type: IV induction and Rapid sequence Ventilation: Mask ventilation without difficulty Laryngoscope Size: Mac and 3 Grade View: Grade I Tube type: Oral Tube size: 7.0 mm Number of attempts: 1 Airway Equipment and Method: Stylet Placement Confirmation: ETT inserted through vocal cords under direct vision, positive ETCO2 and breath sounds checked- equal and bilateral Secured at: 22 cm Tube secured with: Tape Dental Injury: Teeth and Oropharynx as per pre-operative assessment

## 2021-11-17 NOTE — Progress Notes (Signed)
Attempted trying to dangle patient and get her out of bed. Pt was too lethargic and wasn't able to stay awake long enough to follow instructions. Will attempt again during the night.

## 2021-11-17 NOTE — ED Provider Notes (Signed)
[facility] DEPT Provider Note   CSN: [id number] Arrival date & time: 11/17/21  4098     History No chief complaint on file.   Sherry Hampton is a 65 y.o. female with a past medical history of GERD presenting today for the complaint of right lower quadrant pain for the past 4 days.  She reports that this pain has caused her appetite to decline.  Endorsing NVD.  Reports that this pain can worsen with movement.  Nothing has made it better. Has never had any surgeries on her abdomen.  No fever or chills.  Also concerned she may have a UTI due to dark urine and inability to completely empty her bladder.  No dysuria or hematuria.  No back pain.  Past Medical History:  Diagnosis Date   Anemia    past hx    Arthritis    wrists , knees,  feet    Cataract    cordical cataracts per pt    Constipation    takes a stool softener  daily- as long as does this has daily soft stools    GERD (gastroesophageal reflux disease)    Hemorrhoids    Hyperlipidemia     There are no problems to display for this patient.   Past Surgical History:  Procedure Laterality Date   CYSTECTOMY  1972   left buttock with spinal    HEMORRHOID BANDING  02/2015   [facility]    LAPAROSCOPIC SALPINGO OOPHERECTOMY Left 07/1985   right repair      OB History   No obstetric history on file.     Family History  Problem Relation Age of Onset   Colon polyps Father    Esophageal cancer Brother    Colon cancer Paternal Grandmother    Rectal cancer Neg Hx    Stomach cancer Neg Hx    Pancreatic cancer Neg Hx     Social History   Tobacco Use   Smoking status: Former   Smokeless tobacco: Never  Substance Use Topics   Alcohol use: No   Drug use: No    Home Medications Prior to Admission medications   Medication Sig Start Date End Date Taking? Authorizing Provider  Biotin 1000 MCG tablet Take 1,000 mcg daily by mouth.    [provider]  Cholecalciferol 5000 units  capsule Vitamin D3 5000 UNIT Oral Tablet; RxNorm: 119147; Cholecalciferol; 5000 UNIT; 2015-07-10; Not Indicated; one a day 07/10/15   [provider]  folic acid (FOLVITE) 829 MCG tablet Take 400 mcg daily by mouth.    [provider]  methotrexate 2.5 MG tablet  09/09/17   [provider]  naproxen (NAPROSYN) 500 MG tablet Take 500 mg 2 (two) times daily with a meal by mouth.    [provider]  rosuvastatin (CRESTOR) 10 MG tablet Take 10 mg by mouth at bedtime. 08/27/21   [provider]    Allergies    Patient has no known allergies.  Review of Systems   Review of Systems  Constitutional:  Negative for chills and fever.  Respiratory:  Negative for chest tightness and shortness of breath.   Cardiovascular:  Negative for chest pain and palpitations.  Gastrointestinal:  Positive for abdominal pain, diarrhea, nausea and vomiting.  Genitourinary:  Negative for dysuria and hematuria.  Musculoskeletal:  Negative for back pain.   Physical Exam Updated Vital Signs BP 128/89 (BP Location: Right Arm)   Pulse (!) 115   Temp 98.2 F (36.8 C) (  Oral)   Resp 18   SpO2 97%   Physical Exam Vitals and nursing note reviewed.  Constitutional:      General: She is not in acute distress.    Appearance: Normal appearance. She is not ill-appearing.  HENT:     Head: Normocephalic and atraumatic.  Eyes:     General: No scleral icterus.    Conjunctiva/sclera: Conjunctivae normal.  Cardiovascular:     Rate and Rhythm: Tachycardia present.  Pulmonary:     Effort: Pulmonary effort is normal. No respiratory distress.  Abdominal:     General: Abdomen is flat.     Palpations: Abdomen is soft. There is no mass.     Tenderness: There is abdominal tenderness. There is guarding.     Comments: Positive McBurney's  Skin:    General: Skin is warm and dry.     Findings: No rash.  Neurological:     Mental Status: She is alert.  Psychiatric:        Mood and  Affect: Mood normal.        Behavior: Behavior normal.    ED Results / Procedures / Treatments   Labs (all labs ordered are listed, but only abnormal results are displayed) Labs Reviewed  COMPREHENSIVE METABOLIC PANEL - Abnormal; Notable for the following components:      Result Value   Glucose, Bld 118 (*)    Calcium 10.7 (*)    Total Bilirubin 1.7 (*)    All other components within normal limits  CBC WITH DIFFERENTIAL/PLATELET - Abnormal; Notable for the following components:   WBC 13.5 (*)    Neutro Abs 11.4 (*)    Lymphs Abs 0.6 (*)    Monocytes Absolute 1.4 (*)    Abs Immature Granulocytes 0.08 (*)    All other components within normal limits  LIPASE, BLOOD  URINALYSIS, ROUTINE W REFLEX MICROSCOPIC    EKG None  Radiology CT ABDOMEN PELVIS W CONTRAST  Result Date: 11/17/2021 CLINICAL DATA:  Right lower quadrant pain for 1 week EXAM: CT ABDOMEN AND PELVIS WITH CONTRAST TECHNIQUE: Multidetector CT imaging of the abdomen and pelvis was performed using the standard protocol following bolus administration of intravenous contrast. CONTRAST:  38mL OMNIPAQUE IOHEXOL 350 MG/ML SOLN COMPARISON:  None. FINDINGS: Lower chest:  Coronary atherosclerosis.  Dependent atelectasis. Hepatobiliary: No focal liver abnormality.No evidence of biliary obstruction or stone. Pancreas: Dilated main pancreatic duct measuring up to 4 mm. No evidence of pancreatitis or mass lesion. Spleen: Unremarkable. Adrenals/Urinary Tract: Negative adrenals. No hydronephrosis or ureteral stone. Small calculi at the lower pole left kidney measuring up to 4 mm. Subcentimeter cysts. Unremarkable bladder. Stomach/Bowel: Large volume of generalized pneumoperitoneum. There is inflammation along the medial wall of the ascending colon where there is a pericolonic ovoid gas collection likely related to diverticula at this level. Colonic diverticulosis is generalized. 9 mm diameter appendix, but with gas intermittently seen in the  lumen, reaching the tip. The colon is gas or fluid distended until and abrupt transition at the descending sigmoid junction. The sigmoid colon is diffusely affected by diverticula with lumen collapse. Vascular/Lymphatic: No acute vascular abnormality. No mass or adenopathy. Reproductive:Partially calcified uterine fibroid measuring up to 4.5 cm. Other: No ascites or pneumoperitoneum. 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 Critical Value/emergent results were called by telephone at the time of interpretation on 11/17/2021 at 11:58 am to provider Jian Hodgman , who verbally acknowledged these results. IMPRESSION: 1. Large volume pneumoperitoneum likely arising from the medial wall of the  ascending colon where there is inflammation and pericolonic gas collection. This may be diverticular given the extent of colonic diverticulosis. 2. Fluid and gas distended colon above an abrupt transition point at the level of the descending sigmoid junction, need workup for mass at this location. 3. Mildly dilated main pancreatic duct without visible cause. Suggest follow-up if no outside comparison. 4. Atherosclerosis including the coronary arteries. 5. Nonobstructing left renal calculi. Electronically Signed   By: Jorje Guild M.D.   On: 11/17/2021 12:00    Procedures Procedures   Medications Ordered in ED Medications  ondansetron (ZOFRAN) injection 4 mg (has no administration in time range)  sodium chloride 0.9 % bolus 1,000 mL (has no administration in time range)  morphine 4 MG/ML injection 4 mg (has no administration in time range)    ED Course  I have reviewed the triage vital signs and the nursing notes.  Pertinent labs & imaging results that were available during my care of the patient were reviewed by me and considered in my medical decision making (see chart for details).    MDM Rules/Calculators/A&P The differential diagnosis for generalized abdominal pain includes, but is not  limited to AAA, gastroenteritis, appendicitis, Bowel obstruction, Bowel perforation. Gastroparesis, DKA, Hernia, Inflammatory bowel disease, mesenteric ischemia, pancreatitis, peritonitis SBP, volvulus. CT scan ordered at this time.  Lab work: Feeling of a leukocytosis to 13.5.  Otherwise unremarkable.  CT revealed: Large volume pneumoperitoneum coming from the ascending colon.  Also potential mass at descending sigmoid junction.  Surgeon, Dr. Harlow Asa, will come see the patient at this time.  She has been made aware of her situation.  Denies the need for me to call any of her family members.  MD Gerkin saw the patient and is taking her to the OR today.  We will follow the potential mass seen on her CT.  Patient agreeable.  Zosyn has been started.   Final Clinical Impression(s) / ED Diagnoses Final diagnoses:  Pneumoperitoneum    Rx / DC Orders Admit to surgery    Rhae Hammock, PA-C 11/17/21 1321    Jeanell Sparrow, DO 11/17/21 1623

## 2021-11-18 ENCOUNTER — Encounter (HOSPITAL_COMMUNITY): Payer: Self-pay | Admitting: Surgery

## 2021-11-18 LAB — CBC
HCT: 36.7 % (ref 36.0–46.0)
Hemoglobin: 11.7 g/dL — ABNORMAL LOW (ref 12.0–15.0)
MCH: 31.7 pg (ref 26.0–34.0)
MCHC: 31.9 g/dL (ref 30.0–36.0)
MCV: 99.5 fL (ref 80.0–100.0)
Platelets: 243 10*3/uL (ref 150–400)
RBC: 3.69 MIL/uL — ABNORMAL LOW (ref 3.87–5.11)
RDW: 14.6 % (ref 11.5–15.5)
WBC: 13 10*3/uL — ABNORMAL HIGH (ref 4.0–10.5)
nRBC: 0 % (ref 0.0–0.2)

## 2021-11-18 LAB — BASIC METABOLIC PANEL
Anion gap: 8 (ref 5–15)
BUN: 17 mg/dL (ref 8–23)
CO2: 22 mmol/L (ref 22–32)
Calcium: 9 mg/dL (ref 8.9–10.3)
Chloride: 108 mmol/L (ref 98–111)
Creatinine, Ser: 0.52 mg/dL (ref 0.44–1.00)
GFR, Estimated: >60 mL/min (ref >60)
Glucose, Bld: 193 mg/dL — ABNORMAL HIGH (ref 70–99)
Potassium: 4.1 mmol/L (ref 3.5–5.1)
Sodium: 138 mmol/L (ref 135–145)

## 2021-11-18 LAB — HIV ANTIBODY (ROUTINE TESTING W REFLEX): HIV Screen 4th Generation wRfx: NONREACTIVE

## 2021-11-18 MED ORDER — HYDROMORPHONE HCL 1 MG/ML IJ SOLN
0.5000 mg | INTRAMUSCULAR | Status: DC | PRN
  Administered 2021-11-22: 1 mg via INTRAVENOUS
  Filled 2021-11-18: qty 1

## 2021-11-18 MED ORDER — LIP MEDEX EX OINT
TOPICAL_OINTMENT | CUTANEOUS | Status: AC
  Filled 2021-11-18: qty 7

## 2021-11-18 MED ORDER — ACETAMINOPHEN 500 MG PO TABS
1000.0000 mg | ORAL_TABLET | Freq: Four times a day (QID) | ORAL | Status: DC
  Administered 2021-11-18 – 2021-11-19 (×4): 1000 mg
  Filled 2021-11-18 (×6): qty 2

## 2021-11-18 MED ORDER — METHOCARBAMOL 500 MG PO TABS
750.0000 mg | ORAL_TABLET | Freq: Three times a day (TID) | ORAL | Status: DC
Start: 2021-11-18 — End: 2021-11-19
  Administered 2021-11-18 – 2021-11-19 (×3): 750 mg
  Filled 2021-11-18 (×6): qty 2

## 2021-11-18 NOTE — Consult Note (Signed)
[facility] Nurse ostomy consult note Stoma type/location:  Mucous fistula; LLQ; transverse distal colon End ileostomy: RUQ  Stomal assessment/size: both appear pink, moist. The ileostomy is budded, the MF appears to be flush with the skin  Peristomal assessment: NA Treatment options for stomal/peristomal skin: NA  Output MF; no output; minimal brown liquid from the ileostomy  Ostomy pouching: planning for 2pc for the ileostomy/most likely can use closed end pouch or stoma cap for the MF.  Education provided: met with patient today; she does had surgery last evening.  She is up in the chair with NG tube in place. Quite sedated from pain meds but appropriate with questions. I will plan to change pouches tomorrow am when the patient is in the bed.  She lives alone and does not have any family or friends to assist her at home  Enrolled patient in [facility] program: Yes   [facility] Nurse will follow along with you for continued support with ostomy teaching and care [place] MSN, [place], [place], [place], [place]

## 2021-11-18 NOTE — Progress Notes (Signed)
1 Day Post-Op  Subjective: Explained surgery and what happened.  She will still intermittently state "I don't understand what all is going on".  No output in bag currently.  Lives alone with no friends or family to help.  Has a landlord.  ROS: See above, otherwise other systems negative  Objective: Vital signs in last 24 hours: Temp:  [97.2 F (36.2 C)-98.6 F (37 C)] 98 F (36.7 C) (11/28 0442) Pulse Rate:  [86-104] 90 (11/28 0909) Resp:  [11-21] 18 (11/28 0909) BP: (102-151)/(68-85) 112/82 (11/28 0909) SpO2:  [93 %-100 %] 96 % (11/28 0909) Weight:  [79.8 kg] 79.8 kg (11/27 1928) Last BM Date: 11/16/21  Intake/Output from previous day: 11/27 0701 - 11/28 0700 In: 3342.5 [I.V.:2293.2; IV Piggyback:1049.3] Out: 440 [Urine:375; Blood:65] Intake/Output this shift: No intake/output data recorded.  PE: Abd: soft, appropriately tender, midline wound is clean and packed, RLQ ileostomy with pink stoma.  No output currently.  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 GU: foley with clear yellow urine  Lab Results:  Recent Labs    11/17/21 0955 11/18/21 0437  WBC 13.5* 13.0*  HGB 12.7 11.7*  HCT 37.9 36.7  PLT 340 243   BMET Recent Labs    11/17/21 0955 11/18/21 0437  NA 139 138  K 4.2 4.1  CL 101 108  CO2 28 22  GLUCOSE 118* 193*  BUN 20 17  CREATININE 0.50 0.52  CALCIUM 10.7* 9.0   PT/INR No results for input(s): LABPROT, INR in the last 72 hours. CMP     Component Value Date/Time   NA 138 11/18/2021 0437   K 4.1 11/18/2021 0437   CL 108 11/18/2021 0437   CO2 22 11/18/2021 0437   GLUCOSE 193 (H) 11/18/2021 0437   BUN 17 11/18/2021 0437   CREATININE 0.52 11/18/2021 0437   CALCIUM 9.0 11/18/2021 0437   PROT 7.7 11/17/2021 0955   ALBUMIN 3.9 11/17/2021 0955   AST 28 11/17/2021 0955   ALT 14 11/17/2021 0955   ALKPHOS 68 11/17/2021 0955   BILITOT 1.7 (H) 11/17/2021 0955   GFRNONAA >60 11/18/2021 0437   Lipase     Component Value Date/Time    LIPASE 22 11/17/2021 0955       Studies/Results: CT ABDOMEN PELVIS W CONTRAST  Result Date: 11/17/2021 CLINICAL DATA:  Right lower quadrant pain for 1 week EXAM: CT ABDOMEN AND PELVIS WITH CONTRAST TECHNIQUE: Multidetector CT imaging of the abdomen and pelvis was performed using the standard protocol following bolus administration of intravenous contrast. CONTRAST:  47mL OMNIPAQUE IOHEXOL 350 MG/ML SOLN COMPARISON:  None. FINDINGS: Lower chest:  Coronary atherosclerosis.  Dependent atelectasis. Hepatobiliary: No focal liver abnormality.No evidence of biliary obstruction or stone. Pancreas: Dilated main pancreatic duct measuring up to 4 mm. No evidence of pancreatitis or mass lesion. Spleen: Unremarkable. Adrenals/Urinary Tract: Negative adrenals. No hydronephrosis or ureteral stone. Small calculi at the lower pole left kidney measuring up to 4 mm. Subcentimeter cysts. Unremarkable bladder. Stomach/Bowel: Large volume of generalized pneumoperitoneum. There is inflammation along the medial wall of the ascending colon where there is a pericolonic ovoid gas collection likely related to diverticula at this level. Colonic diverticulosis is generalized. 9 mm diameter appendix, but with gas intermittently seen in the lumen, reaching the tip. The colon is gas or fluid distended until and abrupt transition at the descending sigmoid junction. The sigmoid colon is diffusely affected by diverticula with lumen collapse. Vascular/Lymphatic: No acute vascular abnormality. No mass or adenopathy. Reproductive:Partially  calcified uterine fibroid measuring up to 4.5 cm. Other: No ascites or pneumoperitoneum. 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 Critical Value/emergent results were called by telephone at the time of interpretation on 11/17/2021 at 11:58 am to provider MADISON REDWINE , who verbally acknowledged these results. IMPRESSION: 1. Large volume pneumoperitoneum likely arising from the medial wall  of the ascending colon where there is inflammation and pericolonic gas collection. This may be diverticular given the extent of colonic diverticulosis. 2. Fluid and gas distended colon above an abrupt transition point at the level of the descending sigmoid junction, need workup for mass at this location. 3. Mildly dilated main pancreatic duct without visible cause. Suggest follow-up if no outside comparison. 4. Atherosclerosis including the coronary arteries. 5. Nonobstructing left renal calculi. Electronically Signed   By: Jorje Guild M.D.   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 Harlow Asa 11/27 -DC foley -NGT in place today, but minimal output, will likely DC tomorrow -BID WD dressings changes to midline wound.  Will likely plan for [facility] on Wednesday [facility] consult for ileostomy, mucous fistula -cont abx due to feculent peritonitis for likely 5 days. -mobilize/pulm toilet -PT/OT consult as she lives by herself   FEN - NPO/NGT/IVFs VTE - lovenox ID - [email or web address]   LOS: 1 day    Henreitta Cea , [facility] Surgery 11/18/2021, 10:01 AM Please see Amion for pager number during day hours 7:00am-4:30pm or 7:00am -11:30am on weekends

## 2021-11-18 NOTE — Evaluation (Signed)
Physical Therapy Evaluation Patient Details Name: Sherry Hampton MRN: [id number] DOB: 07-Jun-1956 Today's Date: 11/18/2021  History of Present Illness  Patient is 65 y.o. 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 Harlow Asa 11/17/21. PMH significant for anemia, OA, GERD, HLD.   Clinical Impression  Sherry Hampton is 65 y.o. female admitted with above HPI and diagnosis. Patient is currently limited by functional impairments below (see PT problem list). Patient lives alone and is independent at baseline. Patient currently required Mod-Max assist for functional bed mobility and transfers. She was able to complete bed>chair transfer with +2 assist this date with greatest limitation being surgical site pain. Patient will benefit from continued skilled PT interventions to address impairments and progress independence with mobility, recommending SNF follow up for rehab. Acute PT will follow and progress as able.      Recommendations for follow up therapy are one component of a multi-disciplinary discharge planning process, led by the attending physician.  Recommendations may be updated based on patient status, additional functional criteria and insurance authorization.  Follow Up Recommendations Skilled nursing-short term rehab (<3 hours/day)    Assistance Recommended at Discharge Frequent or constant Supervision/Assistance  Functional Status Assessment Patient has had a recent decline in their functional status and demonstrates the ability to make significant improvements in function in a reasonable and predictable amount of time.  Equipment Recommendations  None recommended by PT (TBA)    Recommendations for Other Services OT consult     Precautions / Restrictions Precautions Precautions: Fall Precaution Comments: abdominal surgery (plan for vac on Wed 11/30), Rt/Lt colostomy. Restrictions Weight Bearing  Restrictions: No      Mobility  Bed Mobility Overal bed mobility: Needs Assistance Bed Mobility: Rolling;Sidelying to Sit Rolling: Mod assist Sidelying to sit: Mod assist;Max assist;HOB elevated       General bed mobility comments: Cues for sequencing log roll due to abdominal surgery/pain. Mod-Max assist to guide hand to bed rail and initiate bringing LE's off EOB. Assist to raise trunk with use of bed pad to prevent posterior trunk rotation/lean.    Transfers Overall transfer level: Needs assistance Equipment used: Rolling walker (2 wheels) Transfers: Sit to/from Stand;Bed to chair/wheelchair/BSC Sit to Stand: Mod assist;+2 physical assistance;+2 safety/equipment;From elevated surface   Step pivot transfers: Mod assist;+2 physical assistance;+2 safety/equipment       General transfer comment: cues for hand placement on RW to power up, Mod+2 assist to initiate and complete rise to RW, cues for posture. Pt able to take small side steps at EOB with cues for sequencing and assist to direct walker towards recliner.    Ambulation/Gait                  Stairs            Wheelchair Mobility    Modified Rankin (Stroke Patients Only)       Balance Overall balance assessment: Needs assistance Sitting-balance support: Feet supported;Bilateral upper extremity supported Sitting balance-Leahy Scale: Poor Sitting balance - Comments: poor to fair intermittently   Standing balance support: Reliant on assistive device for balance;During functional activity;Bilateral upper extremity supported Standing balance-Leahy Scale: Poor                               Pertinent Vitals/Pain Pain Assessment: Faces Faces Pain Scale: Hurts even more Pain Location: abdomen with mobility Pain Descriptors / Indicators:  Aching;Discomfort;Grimacing;Guarding Pain Intervention(s): Limited activity within patient's tolerance;Monitored during session;Repositioned    Home  Living Family/patient expects to be discharged to:: Private residence Living Arrangements: Alone   Type of Home: Apartment Home Access: Stairs to enter Entrance Stairs-Rails: Left Entrance Stairs-Number of Steps: 1 curb +7   Home Layout: One level Home Equipment: None      Prior Function Prior Level of Function : Independent/Modified Independent;Driving             Mobility Comments: pt reports fully independent with mobility and ADL's PTA       Hand Dominance   Dominant Hand: Right    Extremity/Trunk Assessment   Upper Extremity Assessment Upper Extremity Assessment: Overall WFL for tasks assessed    Lower Extremity Assessment Lower Extremity Assessment: Generalized weakness    Cervical / Trunk Assessment Cervical / Trunk Assessment: Other exceptions Cervical / Trunk Exceptions: flexed trunk due to abd pain from surgery  Communication   Communication: No difficulties  Cognition Arousal/Alertness: Awake/alert Behavior During Therapy: WFL for tasks assessed/performed Overall Cognitive Status: Within Functional Limits for tasks assessed                                 General Comments: pt pleasant and appears A&O to self and place, not fully aware of situation.        General Comments      Exercises     Assessment/Plan    PT Assessment Patient needs continued PT services  PT Problem List Decreased range of motion;Decreased strength;Decreased activity tolerance;Decreased balance;Decreased mobility;Decreased knowledge of use of DME;Decreased knowledge of precautions       PT Treatment Interventions DME instruction;Gait training;Stair training;Functional mobility training;Therapeutic activities;Therapeutic exercise;Balance training;Patient/family education    PT Goals (Current goals can be found in the Care Plan section)  Acute Rehab PT Goals PT Goal Formulation: With patient Time For Goal Achievement: 12/02/21 Potential to Achieve Goals:  Good    Frequency 7X/week   Barriers to discharge        Co-evaluation               AM-PAC PT "6 Clicks" Mobility  Outcome Measure Help needed turning from your back to your side while in a flat bed without using bedrails?: A Lot Help needed moving from lying on your back to sitting on the side of a flat bed without using bedrails?: Total Help needed moving to and from a bed to a chair (including a wheelchair)?: A Lot Help needed standing up from a chair using your arms (e.g., wheelchair or bedside chair)?: A Lot Help needed to walk in hospital room?: A Lot Help needed climbing 3-5 steps with a railing? : A Lot 6 Click Score: 11    End of Session Equipment Utilized During Treatment: Gait belt Activity Tolerance: Patient tolerated treatment well Patient left: in chair;with call bell/phone within reach;with chair alarm set Nurse Communication: Mobility status PT Visit Diagnosis: Muscle weakness (generalized) (M62.81);Difficulty in walking, not elsewhere classified (R26.2);Other abnormalities of gait and mobility (R26.89)    Time: 7654-6503 PT Time Calculation (min) (ACUTE ONLY): 23 min   Charges:   PT Evaluation $PT Eval Moderate Complexity: 1 Mod PT Treatments $Therapeutic Activity: 8-22 mins        Verner Mould, DPT Acute Rehabilitation Services Office [phone] Pager [phone]   Jacques Navy 11/18/2021, 2:01 PM

## 2021-11-19 LAB — BASIC METABOLIC PANEL
Anion gap: 6 (ref 5–15)
BUN: 20 mg/dL (ref 8–23)
CO2: 26 mmol/L (ref 22–32)
Calcium: 9.9 mg/dL (ref 8.9–10.3)
Chloride: 107 mmol/L (ref 98–111)
Creatinine, Ser: 0.87 mg/dL (ref 0.44–1.00)
GFR, Estimated: >60 mL/min (ref >60)
Glucose, Bld: 94 mg/dL (ref 70–99)
Potassium: 3.9 mmol/L (ref 3.5–5.1)
Sodium: 139 mmol/L (ref 135–145)

## 2021-11-19 LAB — CBC
HCT: 33.1 % — ABNORMAL LOW (ref 36.0–46.0)
Hemoglobin: 10.7 g/dL — ABNORMAL LOW (ref 12.0–15.0)
MCH: 31.6 pg (ref 26.0–34.0)
MCHC: 32.3 g/dL (ref 30.0–36.0)
MCV: 97.6 fL (ref 80.0–100.0)
Platelets: 281 10*3/uL (ref 150–400)
RBC: 3.39 MIL/uL — ABNORMAL LOW (ref 3.87–5.11)
RDW: 14.6 % (ref 11.5–15.5)
WBC: 14.9 10*3/uL — ABNORMAL HIGH (ref 4.0–10.5)
nRBC: 0 % (ref 0.0–0.2)

## 2021-11-19 LAB — SURGICAL PATHOLOGY

## 2021-11-19 MED ORDER — METHOCARBAMOL 500 MG PO TABS
750.0000 mg | ORAL_TABLET | Freq: Three times a day (TID) | ORAL | Status: DC
  Administered 2021-11-19 – 2021-11-26 (×19): 750 mg via ORAL
  Filled 2021-11-19 (×16): qty 2

## 2021-11-19 MED ORDER — ACETAMINOPHEN 500 MG PO TABS
1000.0000 mg | ORAL_TABLET | Freq: Four times a day (QID) | ORAL | Status: DC
  Administered 2021-11-19 – 2021-12-17 (×65): 1000 mg via ORAL
  Filled 2021-11-19 (×72): qty 2

## 2021-11-19 MED ORDER — OXYCODONE HCL 5 MG/5ML PO SOLN
5.0000 mg | ORAL | Status: DC | PRN
  Administered 2021-11-20: 5 mg via ORAL
  Filled 2021-11-19: qty 10

## 2021-11-19 NOTE — TOC Initial Note (Signed)
Transition of Care [facility]) - Initial/Assessment Note    Patient Details  Name: Sherry Hampton MRN: [id number] Date of Birth: 08/29/1956  Transition of Care [facility]) CM/SW Contact:    Lennart Pall, LCSW Phone Number: 11/19/2021, 2:01 PM  Clinical Narrative:                 Met with pt today to introduce self/ TOC role with dc planning. Reviewed with pt the recommendations from PT/OT/ MD for SNF rehab initially prior to return home.  Pt very reluctant to agree to this plan.  She minimizes her assistance needs and compares managing the ostomy to "just going number 2 like I always have".   Reviewed the physical assistance she required with therapies and the plan now for a VAC placement tomorrow.  Pt asks that I give her the evening "to think about it".   Pt does confirm that she lives alone and has no assistance/ support in the area.  SNF option does appear to be the most appropriate.  Will follow up with pt in the morning and will start SNF bed search.  Expected Discharge Plan: Skilled Nursing Facility Barriers to Discharge: Continued Medical Work up, Ship broker, SNF Pending bed offer   Patient Goals and CMS Choice Patient states their goals for this hospitalization and ongoing recovery are:: Pt states her goal is to return home but relucatantly considering SNF rehab prior to home      Expected Discharge Plan and Services Expected Discharge Plan: [place] In-house Referral: Clinical Social Work     Living arrangements for the past 2 months: Single Family Home                                      Prior Living Arrangements/Services Living arrangements for the past 2 months: Single Family Home Lives with:: Self Patient language and need for interpreter reviewed:: Yes Do you feel safe going back to the place where you live?: Yes      Need for Family Participation in Patient Care: Yes (Comment) Care giver support system in place?: No (comment)    Criminal Activity/Legal Involvement Pertinent to Current Situation/Hospitalization: No - Comment as needed  Activities of Daily Living Home Assistive Devices/Equipment: Eyeglasses ADL Screening (condition at time of admission) Patient's cognitive ability adequate to safely complete daily activities?: Yes Is the patient deaf or have difficulty hearing?: No Does the patient have difficulty seeing, even when wearing glasses/contacts?: No Does the patient have difficulty concentrating, remembering, or making decisions?: No Patient able to express need for assistance with ADLs?: Yes Does the patient have difficulty dressing or bathing?: No Independently performs ADLs?: Yes (appropriate for developmental age) Does the patient have difficulty walking or climbing stairs?: No Weakness of Legs: None Weakness of Arms/Hands: None  Permission Sought/Granted Permission sought to share information with : Family Supports Permission granted to share information with : Yes, Verbal Permission Granted  Share Information with NAME: Sherry Hampton     Permission granted to share info w Relationship: brother  Permission granted to share info w Contact Information: [phone]  Emotional Assessment Appearance:: Appears older than stated age Attitude/Demeanor/Rapport: Engaged, Apprehensive Affect (typically observed): Apprehensive Orientation: : Oriented to Self, Oriented to Place, Oriented to  Time, Oriented to Situation Alcohol / Substance Use: Not Applicable Psych Involvement: No (comment)  Admission diagnosis:  Pneumoperitoneum [K66.8] Perforated viscus [R19.8] Patient Active Problem List  Diagnosis Date Noted   Perforated abdominal viscus 11/17/2021   Perforated viscus 11/17/2021   PCP:  Pcp, No Pharmacy:   [facility] [address] [place] - [address] [place] [place] [address] Phone: [phone] Fax: [phone]     Social Determinants of  Health (SDOH) Interventions    Readmission Risk Interventions No flowsheet data found.

## 2021-11-19 NOTE — Progress Notes (Signed)
2 Days Post-Op  Subjective: Patient feeling well today.  Accidentally pulled NGT out but no nausea.  Bag with some output.  PT recommended SNF at discharge.  Discussed with patient today.   ROS: See above, otherwise other systems negative  Objective: Vital signs in last 24 hours: Temp:  [97.5 F (36.4 C)-98.5 F (36.9 C)] 97.5 F (36.4 C) (11/29 0521) Pulse Rate:  [74-76] 76 (11/29 0521) Resp:  [14-16] 16 (11/29 0521) BP: (94-103)/(56-60) 103/60 (11/29 0521) SpO2:  [93 %-95 %] 95 % (11/29 0521) Last BM Date: 11/19/21  Intake/Output from previous day: 11/28 0701 - 11/29 0700 In: 1892 [I.V.:1759.9; IV Piggyback:132.1] Out: 1000 [Urine:500; Emesis/NG output:300; Stool:200] Intake/Output this shift: Total I/O In: 0  Out: 200 [Urine:200]  PE: Abd: soft, appropriately tender, midline wound is clean and packed, RLQ ileostomy with pink stoma and some liquids stool present.  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  Lab Results:  Recent Labs    11/18/21 0437 11/19/21 0432  WBC 13.0* 14.9*  HGB 11.7* 10.7*  HCT 36.7 33.1*  PLT 243 281   BMET Recent Labs    11/18/21 0437 11/19/21 0432  NA 138 139  K 4.1 3.9  CL 108 107  CO2 22 26  GLUCOSE 193* 94  BUN 17 20  CREATININE 0.52 0.87  CALCIUM 9.0 9.9   PT/INR No results for input(s): LABPROT, INR in the last 72 hours. CMP     Component Value Date/Time   NA 139 11/19/2021 0432   K 3.9 11/19/2021 0432   CL 107 11/19/2021 0432   CO2 26 11/19/2021 0432   GLUCOSE 94 11/19/2021 0432   BUN 20 11/19/2021 0432   CREATININE 0.87 11/19/2021 0432   CALCIUM 9.9 11/19/2021 0432   PROT 7.7 11/17/2021 0955   ALBUMIN 3.9 11/17/2021 0955   AST 28 11/17/2021 0955   ALT 14 11/17/2021 0955   ALKPHOS 68 11/17/2021 0955   BILITOT 1.7 (H) 11/17/2021 0955   GFRNONAA >60 11/19/2021 0432   Lipase     Component Value Date/Time   LIPASE 22 11/17/2021 0955       Studies/Results: CT ABDOMEN PELVIS W CONTRAST  Result  Date: 11/17/2021 CLINICAL DATA:  Right lower quadrant pain for 1 week EXAM: CT ABDOMEN AND PELVIS WITH CONTRAST TECHNIQUE: Multidetector CT imaging of the abdomen and pelvis was performed using the standard protocol following bolus administration of intravenous contrast. CONTRAST:  32mL OMNIPAQUE IOHEXOL 350 MG/ML SOLN COMPARISON:  None. FINDINGS: Lower chest:  Coronary atherosclerosis.  Dependent atelectasis. Hepatobiliary: No focal liver abnormality.No evidence of biliary obstruction or stone. Pancreas: Dilated main pancreatic duct measuring up to 4 mm. No evidence of pancreatitis or mass lesion. Spleen: Unremarkable. Adrenals/Urinary Tract: Negative adrenals. No hydronephrosis or ureteral stone. Small calculi at the lower pole left kidney measuring up to 4 mm. Subcentimeter cysts. Unremarkable bladder. Stomach/Bowel: Large volume of generalized pneumoperitoneum. There is inflammation along the medial wall of the ascending colon where there is a pericolonic ovoid gas collection likely related to diverticula at this level. Colonic diverticulosis is generalized. 9 mm diameter appendix, but with gas intermittently seen in the lumen, reaching the tip. The colon is gas or fluid distended until and abrupt transition at the descending sigmoid junction. The sigmoid colon is diffusely affected by diverticula with lumen collapse. Vascular/Lymphatic: No acute vascular abnormality. No mass or adenopathy. Reproductive:Partially calcified uterine fibroid measuring up to 4.5 cm. Other: No ascites or pneumoperitoneum. 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 Critical Value/emergent results were called by telephone at the time of interpretation on 11/17/2021 at 11:58 am to provider MADISON REDWINE , who verbally acknowledged these results. IMPRESSION: 1. Large volume pneumoperitoneum likely arising from the medial wall of the ascending colon where there is inflammation and pericolonic gas collection. This may be  diverticular given the extent of colonic diverticulosis. 2. Fluid and gas distended colon above an abrupt transition point at the level of the descending sigmoid junction, need workup for mass at this location. 3. Mildly dilated main pancreatic duct without visible cause. Suggest follow-up if no outside comparison. 4. Atherosclerosis including the coronary arteries. 5. Nonobstructing left renal calculi. Electronically Signed   By: Jorje Guild M.D.   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 Harlow Asa 11/27 -voiding -NGT out.  Will start CLD today -BID WD dressings changes to midline wound.  Will plan for [facility] on Wednesday [facility] consult for ileostomy, mucous fistula -cont abx due to feculent peritonitis for likely 5 days. -mobilize/pulm toilet -PT/OT recommend SNF, TOC consult.  FEN - CLD, IVFs VTE - lovenox ID - [email or web address]   LOS: 2 days    Henreitta Cea , [facility] 11/19/2021, 9:36 AM Please see Amion for pager number during day hours 7:00am-4:30pm or 7:00am -11:30am on weekends
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
## 2021-11-20 LAB — CBC
HCT: 28.8 % — ABNORMAL LOW (ref 36.0–46.0)
Hemoglobin: 9.6 g/dL — ABNORMAL LOW (ref 12.0–15.0)
MCH: 31.6 pg (ref 26.0–34.0)
MCHC: 33.3 g/dL (ref 30.0–36.0)
MCV: 94.7 fL (ref 80.0–100.0)
Platelets: 250 10*3/uL (ref 150–400)
RBC: 3.04 MIL/uL — ABNORMAL LOW (ref 3.87–5.11)
RDW: 14.5 % (ref 11.5–15.5)
WBC: 13.6 10*3/uL — ABNORMAL HIGH (ref 4.0–10.5)
nRBC: 0 % (ref 0.0–0.2)

## 2021-11-20 MED ORDER — PSYLLIUM 95 % PO PACK
1.0000 | PACK | Freq: Two times a day (BID) | ORAL | Status: DC
  Administered 2021-11-20 – 2022-02-05 (×89): 1 via ORAL
  Filled 2021-11-20 (×109): qty 1

## 2021-11-20 NOTE — Progress Notes (Signed)
Physical Therapy Treatment Patient Details Name: Sherry Hampton MRN: [id number] DOB: 10-Mar-1956 Today's Date: 11/20/2021   History of Present Illness Patient is 65 y.o. 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 Harlow Asa 11/17/21. PMH significant for anemia, OA, GERD, HLD.    PT Comments    Patient progressing well with mobility requiring reduced physical assist for transfers and gait. Her greatest limitation remains poor insight and safety awareness. She was able to ambulate increased distance in hall but required repeated cues/assist to mange walker and safely negotiate environment. Educated on benefits of ST rehab for improved strength and independence with functional mobility for safe return home. Continue to recommend SNF follow up. PT will progress pt as able.    Recommendations for follow up therapy are one component of a multi-disciplinary discharge planning process, led by the attending physician.  Recommendations may be updated based on patient status, additional functional criteria and insurance authorization.  Follow Up Recommendations  Skilled nursing-short term rehab (<3 hours/day)     Assistance Recommended at Discharge Frequent or constant Supervision/Assistance  Equipment Recommendations  None recommended by PT (TBA)    Recommendations for Other Services OT consult     Precautions / Restrictions Precautions Precautions: Fall Precaution Comments: abdominal surgery w/ wound vac, Rt/Lt colostomy. Restrictions Weight Bearing Restrictions: No     Mobility  Bed Mobility Overal bed mobility: Needs Assistance Bed Mobility: Rolling;Sidelying to Sit Rolling: Min assist Sidelying to sit: HOB elevated;Min assist;Mod assist       General bed mobility comments: Cues for sequencing log roll due to abdominal surgery/pain. Min-Mod assist to guide/initiate bringing LE's off EOB. Assist to  raise trunk with use of bed pad to scoot/reposition at EOB.    Transfers Overall transfer level: Needs assistance Equipment used: Rolling walker (2 wheels) Transfers: Sit to/from Stand Sit to Stand: +2 safety/equipment;From elevated surface;Min assist           General transfer comment: cues for hand placement with power up from slightly elevated EOB. Pt required repeated cues for hand placement on recliner to initiate rise from lower surface with bil UE's. Min assist to steady and fully stand.    Ambulation/Gait Ambulation/Gait assistance: Min assist;+2 safety/equipment Gait Distance (Feet): 180 Feet Assistive device: Rolling walker (2 wheels) Gait Pattern/deviations: Step-through pattern;Decreased stride length;Shuffle;Drifts right/left;Trunk flexed Gait velocity: decr     General Gait Details: pt pushing RW too far ahead and unsteady with slight drift Rt/Lt. min assist to guide walker and negotiate obstacles in hallway.   Stairs             Wheelchair Mobility    Modified Rankin (Stroke Patients Only)       Balance Overall balance assessment: Needs assistance Sitting-balance support: Feet supported;Bilateral upper extremity supported Sitting balance-Leahy Scale: Poor Sitting balance - Comments: poor to fair intermittently   Standing balance support: Reliant on assistive device for balance;During functional activity;Bilateral upper extremity supported Standing balance-Leahy Scale: Poor                              Cognition Arousal/Alertness: Awake/alert Behavior During Therapy: WFL for tasks assessed/performed Overall Cognitive Status: No family/caregiver present to determine baseline cognitive functioning Area of Impairment: Following commands;Safety/judgement;Problem solving                       Following Commands: Follows multi-step commands  inconsistently;Follows multi-step commands with increased time Safety/Judgement:  Decreased awareness of deficits;Decreased awareness of safety   Problem Solving: Requires verbal cues;Difficulty sequencing General Comments: pt pleasant and appears A&O to self and place, not fully aware of situation and poor safety awareness with deficits.        Exercises      General Comments        Pertinent Vitals/Pain Pain Assessment: Faces Faces Pain Scale: Hurts little more Pain Location: abdomen with mobility Pain Descriptors / Indicators: Aching;Discomfort;Grimacing;Guarding Pain Intervention(s): Limited activity within patient's tolerance;Monitored during session;Repositioned    Home Living                          Prior Function            PT Goals (current goals can now be found in the care plan section) Acute Rehab PT Goals PT Goal Formulation: With patient Time For Goal Achievement: 12/02/21 Potential to Achieve Goals: Good Progress towards PT goals: Progressing toward goals    Frequency    7X/week      PT Plan Current plan remains appropriate    Co-evaluation              AM-PAC PT "6 Clicks" Mobility   Outcome Measure  Help needed turning from your back to your side while in a flat bed without using bedrails?: A Lot Help needed moving from lying on your back to sitting on the side of a flat bed without using bedrails?: Total Help needed moving to and from a bed to a chair (including a wheelchair)?: A Lot Help needed standing up from a chair using your arms (e.g., wheelchair or bedside chair)?: A Lot Help needed to walk in hospital room?: A Lot Help needed climbing 3-5 steps with a railing? : A Lot 6 Click Score: 11    End of Session Equipment Utilized During Treatment: Gait belt Activity Tolerance: Patient tolerated treatment well Patient left: in chair;with call bell/phone within reach;with chair alarm set Nurse Communication: Mobility status PT Visit Diagnosis: Muscle weakness (generalized) (M62.81);Difficulty in  walking, not elsewhere classified (R26.2);Other abnormalities of gait and mobility (R26.89)     Time: 7829-5621 PT Time Calculation (min) (ACUTE ONLY): 24 min  Charges:  $Gait Training: 23-37 mins                     Verner Mould, DPT Acute Rehabilitation Services Office [phone] Pager [phone]    Jacques Navy 11/20/2021, 1:47 PM

## 2021-11-20 NOTE — TOC Progression Note (Signed)
Transition of Care [facility]) - Progression Note    Patient Details  Name: Sherry Hampton MRN: [id number] Date of Birth: August 06, 1956  Transition of Care [facility]) CM/SW Contact  Lennart Pall, LCSW Phone Number: 11/20/2021, 12:30 PM  Clinical Narrative:    Met briefly with pt who is currently agreeable with plan for SNF so will begin work up today.     Expected Discharge Plan: [place] Barriers to Discharge: Continued Medical Work up, Ship broker, SNF Pending bed offer  Expected Discharge Plan and Services Expected Discharge Plan: [place] In-house Referral: Clinical Social Work     Living arrangements for the past 2 months: Single Family Home                                       Social Determinants of Health (SDOH) Interventions    Readmission Risk Interventions No flowsheet data found.

## 2021-11-20 NOTE — Consult Note (Signed)
[place] Nurse Consult Note: Reason for Consult: application of NPWT dressing to midline surgical wound Wound type: surgical  Pressure Injury POA: NA Measurement: 17cm x 6cm x 5cm  Wound bed: clean, pale, subcutaneous tissue Drainage (amount, consistency, odor) minimal  Periwound: intact  Dressing procedure/placement/frequency: Applied 2pc of black foam; one in the deepest portion of the wound bed distal and proximal wound bed; one piece used to fill the remainder of the wound bed. Used 1/2 of ostomy barrier ring at umbilicus on the right side.Seal obtained at 136mmHG. Patient did not anticipate pain with dressing and requested pain meds during change. She received PO oxycontin just after dressing change. Tolerated well.   [place] Nurse ostomy follow up Stoma type/location:  Mucous fistula: LLQ, 1 1/8" slightly oval shaped; flush with skin; pink and moist End ileostomy: 1 1/4" round, budded, pink, moist Stomal assessment/size: see above Peristomal assessment: intact at both stomas  Treatment options for stomal/peristomal skin: 2" skin barrier used around ileostomy for aid in seal and protection of skin from effluent  Output  MF: bloody; scant Ileostomy; liquid brown  Ostomy pouching:  1pc.; flat used on MF 2pc. 2 1/4" with 2" skin barrier ring used on ileostomy  Education provided:  Patient lives alone; today her cognitive ability to retain information is questionable. She will have no one to support her with two stomas and NPWT dressing at home, recommend SNF at DC at least short term.  During my visit she ask several times about how she will "poop" and "will all this really work".  She does ask about cancer diagnosis as well at which time I explained her surgeon would be the best one to discuss this with her.  Attempted a few times to explain  role of ostomy nurse and creation of stoma  Explained stoma characteristics (budded, flush, color, texture, care) Discussed at length difference in  mucous fistula and functional ileostomy  Demonstrated pouch change (cutting new skin barrier, measuring stoma, cleaning peristomal skin and stoma, use of barrier ring) She was upset with odor and need to cut skin barriers. She was not ready for any additional teaching today  Enrolled patient in [place] Start Discharge program: Yes [place] Nurse will follow along with you for continued support with ostomy teaching and care [place] MSN, [place], [place], [place], [place]

## 2021-11-20 NOTE — Progress Notes (Signed)
    3 Days Post-Op  Subjective: Patient feeling well today. No n/v. Tolerating liquids but with decreased appetite. PT recommended SNF at discharge.   ROS: See above, otherwise other systems negative  Objective: Vital signs in last 24 hours: Temp:  [97.5 F (36.4 C)-98.8 F (37.1 C)] 97.5 F (36.4 C) (11/30 0500) Pulse Rate:  [68-76] 68 (11/30 0500) Resp:  [16-17] 16 (11/30 0500) BP: (102-118)/(63-64) 102/63 (11/30 0500) SpO2:  [94 %-96 %] 96 % (11/30 0500) Last BM Date: 11/19/21  Intake/Output from previous day: 11/29 0701 - 11/30 0700 In: 2238.9 [P.O.:540; I.V.:1626; IV Piggyback:72.9] Out: 500 [Urine:300; Stool:200] Intake/Output this shift: No intake/output data recorded.  PE: Abd: soft, appropriately tender, midline wound is clean and packed, RLQ ileostomy with pink stoma and some thin liquid stool present.  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  Lab Results:  Recent Labs    11/19/21 0432 11/20/21 0440  WBC 14.9* 13.6*  HGB 10.7* 9.6*  HCT 33.1* 28.8*  PLT 281 250   BMET Recent Labs    11/18/21 0437 11/19/21 0432  NA 138 139  K 4.1 3.9  CL 108 107  CO2 22 26  GLUCOSE 193* 94  BUN 17 20  CREATININE 0.52 0.87  CALCIUM 9.0 9.9   PT/INR No results for input(s): LABPROT, INR in the last 72 hours. CMP     Component Value Date/Time   NA 139 11/19/2021 0432   K 3.9 11/19/2021 0432   CL 107 11/19/2021 0432   CO2 26 11/19/2021 0432   GLUCOSE 94 11/19/2021 0432   BUN 20 11/19/2021 0432   CREATININE 0.87 11/19/2021 0432   CALCIUM 9.9 11/19/2021 0432   PROT 7.7 11/17/2021 0955   ALBUMIN 3.9 11/17/2021 0955   AST 28 11/17/2021 0955   ALT 14 11/17/2021 0955   ALKPHOS 68 11/17/2021 0955   BILITOT 1.7 (H) 11/17/2021 0955   GFRNONAA >60 11/19/2021 0432   Lipase     Component Value Date/Time   LIPASE 22 11/17/2021 0955       Studies/Results: No results found.  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 Harlow Asa 11/27 -voiding -Full liquids, add BID metamucil -BID WD dressings changes to midline wound.  Will plan for [facility] today -WOC consult for ileostomy, mucous fistula, wound VAC -cont abx due to feculent peritonitis for likely 5 days. -mobilize/pulm toilet -PT/OT recommend SNF, TOC consult.  FEN - FLD, IVFs VTE - lovenox ID - [email or web address]   LOS: 3 days   Nadeen Landau, MD [facility], [place]

## 2021-11-20 NOTE — NC FL2 (Signed)
[place] LEVEL OF CARE SCREENING TOOL     IDENTIFICATION  Patient Name: Sherry Hampton Birthdate: 08/20/1956 Sex: female Admission Date (Current Location): 11/17/2021  [facility] and [place] Number:  Herbalist and Address:  [facility],  [place] [address], [place]      Provider Number: [id number]  Attending Physician Name and Address:  Edison Pace, Md, MD  Relative Name and Phone Number:       Current Level of Care: Hospital Recommended Level of Care: [place] Prior Approval Number:    Date Approved/Denied:   PASRR Number: [id number]  Discharge Plan: SNF    Current Diagnoses: Patient Active Problem List   Diagnosis Date Noted   Perforated abdominal viscus 11/17/2021   Perforated viscus 11/17/2021    Orientation RESPIRATION BLADDER Height & Weight     Self, Time, Situation, Place  Normal Continent Weight: 176 lb (79.8 kg) Height:  5\' 10"  (177.8 cm)  BEHAVIORAL SYMPTOMS/MOOD NEUROLOGICAL BOWEL NUTRITION STATUS      Colostomy    AMBULATORY STATUS COMMUNICATION OF NEEDS Skin   Limited Assist Verbally Wound Vac, Other (Comment), Surgical wounds (colostomy; fistual pouch)                       Personal Care Assistance Level of Assistance  Bathing, Dressing Bathing Assistance: Limited assistance   Dressing Assistance: Limited assistance     Functional Limitations Info             SPECIAL CARE FACTORS FREQUENCY  PT (By licensed PT), OT (By licensed OT)     PT Frequency: 5x/wk OT Frequency: 5x/wk            Contractures Contractures Info: Not present    Additional Factors Info  Code Status, Allergies Code Status Info: Full Allergies Info: NKDA           Current Medications (11/20/2021):  This is the current hospital active medication list Current Facility-Administered Medications  Medication Dose Route Frequency Provider Last Rate Last Admin   0.9 %  sodium chloride infusion    Intravenous PRN [name], MD   Stopped at 11/18/21 0522   0.9 % irrigation (POUR BTL)    PRN [name], MD   4,000 mL at 11/17/21 1435   acetaminophen (TYLENOL) tablet 1,000 mg  1,000 mg Oral Q6H Saverio Danker, PA-C   1,000 mg at 11/20/21 1229   chlorhexidine (PERIDEX) 0.12 % solution 15 mL  15 mL Mouth Rinse BID [name], MD   15 mL at 11/20/21 0943   Chlorhexidine Gluconate Cloth 2 % PADS 6 each  6 each Topical Daily [name], MD   6 each at 11/20/21 0943   dextrose 5 % and 0.45 % NaCl with KCl 20 mEq/L infusion   Intravenous Continuous Saverio Danker, PA-C 100 mL/hr at 11/19/21 0835 New Bag at 11/19/21 0835   enoxaparin (LOVENOX) injection 40 mg  40 mg Subcutaneous Q24H [name], MD   40 mg at 11/20/21 4944   HYDROmorphone (DILAUDID) injection 0.5-1 mg  0.5-1 mg Intravenous Q2H PRN Saverio Danker, PA-C       MEDLINE mouth rinse  15 mL Mouth Rinse q12n4p [name], MD   15 mL at 11/20/21 1220   methocarbamol (ROBAXIN) tablet 750 mg  750 mg Oral TID Saverio Danker, PA-C   750 mg at 11/20/21 0942   ondansetron (ZOFRAN-ODT) disintegrating tablet 4 mg  4 mg Oral Q6H PRN [name],  MD       Or   ondansetron (ZOFRAN) injection 4 mg  4 mg Intravenous Q6H PRN [name], MD       oxyCODONE (ROXICODONE) 5 MG/5ML solution 5-10 mg  5-10 mg Oral Q4H PRN Saverio Danker, PA-C   5 mg at 11/20/21 1116   pantoprazole (PROTONIX) injection 40 mg  40 mg Intravenous QHS [name], MD   40 mg at 11/19/21 2047   piperacillin-tazobactam (ZOSYN) IVPB 3.375 g  3.375 g Intravenous Margaretha Glassing, MD 12.5 mL/hr at 11/20/21 1233 3.375 g at 11/20/21 1233   psyllium (HYDROCIL/METAMUCIL) 1 packet  1 packet Oral BID Ileana Roup, MD   1 packet at 11/20/21 [phone]     Discharge Medications: Please see discharge summary for a list of discharge medications.  Relevant Imaging Results:  Relevant Lab Results:   Additional Information [phone]  Lennart Pall, LCSW

## 2021-11-21 MED ORDER — OXYCODONE HCL 5 MG PO TABS
5.0000 mg | ORAL_TABLET | ORAL | Status: DC | PRN
Start: 2021-11-21 — End: 2021-11-27
  Administered 2021-11-24 – 2021-11-25 (×2): 5 mg via ORAL
  Filled 2021-11-21 (×4): qty 1

## 2021-11-21 MED ORDER — LOPERAMIDE HCL 2 MG PO CAPS
2.0000 mg | ORAL_CAPSULE | Freq: Two times a day (BID) | ORAL | Status: DC
Start: 2021-11-21 — End: 2021-11-24
  Administered 2021-11-21 – 2021-11-23 (×6): 2 mg via ORAL
  Filled 2021-11-21 (×6): qty 1

## 2021-11-21 NOTE — Progress Notes (Signed)
    4 Days Post-Op  Subjective: Patient states she doesn't like all of her different bags cause of the smell.  No nausea.  Eating some, but not a ton she says.    ROS: See above, otherwise other systems negative  Objective: Vital signs in last 24 hours: Temp:  [97.8 F (36.6 C)-99.1 F (37.3 C)] 99.1 F (37.3 C) (12/01 0514) Pulse Rate:  [76-86] 86 (12/01 0514) Resp:  [17-18] 17 (12/01 0514) BP: (93-139)/(62-82) 139/76 (12/01 0514) SpO2:  [93 %-98 %] 98 % (12/01 0514) Last BM Date: 11/21/21  Intake/Output from previous day: 11/30 0701 - 12/01 0700 In: 2607.3 [I.V.:2457.4; IV Piggyback:149.9] Out: 9476 [Urine:500; LYYTK:3546] Intake/Output this shift: No intake/output data recorded.  PE: Abd: soft, appropriately tender, midline wound with VAC in place, RLQ ileostomy with pink stoma and some thin bilious liquid present.  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  Lab Results:  Recent Labs    11/19/21 0432 11/20/21 0440  WBC 14.9* 13.6*  HGB 10.7* 9.6*  HCT 33.1* 28.8*  PLT 281 250   BMET Recent Labs    11/19/21 0432  NA 139  K 3.9  CL 107  CO2 26  GLUCOSE 94  BUN 20  CREATININE 0.87  CALCIUM 9.9   PT/INR No results for input(s): LABPROT, INR in the last 72 hours. CMP     Component Value Date/Time   NA 139 11/19/2021 0432   K 3.9 11/19/2021 0432   CL 107 11/19/2021 0432   CO2 26 11/19/2021 0432   GLUCOSE 94 11/19/2021 0432   BUN 20 11/19/2021 0432   CREATININE 0.87 11/19/2021 0432   CALCIUM 9.9 11/19/2021 0432   PROT 7.7 11/17/2021 0955   ALBUMIN 3.9 11/17/2021 0955   AST 28 11/17/2021 0955   ALT 14 11/17/2021 0955   ALKPHOS 68 11/17/2021 0955   BILITOT 1.7 (H) 11/17/2021 0955   GFRNONAA >60 11/19/2021 0432   Lipase     Component Value Date/Time   LIPASE 22 11/17/2021 0955       Studies/Results: No results found.  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 Harlow Asa 11/27 -voiding -regular diet, BID metamucil, ileostomy 1275cc yesterday, will add BID imodium -cont VAC, MWF change -WOC consult for ileostomy, mucous fistula, wound VAC -cont abx due to feculent peritonitis for likely 5 days. -mobilize/pulm toilet -PT/OT recommend SNF, TOC consult.  FEN - regular diet, IVFs, monitor ileostomy output VTE - lovenox ID - zosyn D4/5   LOS: 4 days   Henreitta Cea, [facility], A [facility]

## 2021-11-21 NOTE — Care Management Important Message (Signed)
Important Message  Patient Details IM Letter placed in Patients room. Name: Sherry Hampton MRN: [id number] Date of Birth: 1956/05/07   Medicare Important Message Given:  Yes     Kerin Salen 11/21/2021, 2:59 PM

## 2021-11-21 NOTE — TOC Progression Note (Signed)
Transition of Care [facility]) - Progression Note    Patient Details  Name: Sherry Hampton MRN: [id number] Date of Birth: Nov 30, 1956  Transition of Care [facility]) CM/SW Contact  Lennart Pall, LCSW Phone Number: 11/21/2021, 3:33 PM  Clinical Narrative:    Met with pt today to review SNF bed offers and pt has accepted bed at [facility].  Have alerted facility and they have begun insurance authorization (please note that [facility] can take several days to approve SNF).  I have submitted order to [facility] (contact, Leontine Locket @ [phone]) for pt's wound VAC and they will begin insurance auth for that as well.  Have requested the [facility] be delivered to pt's hospital room prior to SNF dc.  Will keep team posted on auth and readiness for dc.   Expected Discharge Plan: [place] Barriers to Discharge: Continued Medical Work up, Ship broker, SNF Pending bed offer  Expected Discharge Plan and Services Expected Discharge Plan: [place] In-house Referral: Clinical Social Work     Living arrangements for the past 2 months: Single Family Home                                       Social Determinants of Health (SDOH) Interventions    Readmission Risk Interventions No flowsheet data found.

## 2021-11-22 MED ORDER — ENSURE ENLIVE PO LIQD
237.0000 mL | Freq: Two times a day (BID) | ORAL | Status: DC
  Administered 2021-11-22 – 2021-11-26 (×6): 237 mL via ORAL

## 2021-11-22 MED ORDER — HYDROMORPHONE HCL 1 MG/ML IJ SOLN
0.5000 mg | Freq: Four times a day (QID) | INTRAMUSCULAR | Status: DC | PRN
  Administered 2021-11-25 – 2021-11-27 (×3): 0.5 mg via INTRAVENOUS
  Filled 2021-11-22 (×3): qty 0.5

## 2021-11-22 NOTE — Progress Notes (Signed)
Occupational Therapy Treatment Patient Details Name: Sherry Hampton MRN: [id number] DOB: Apr 01, 1956 Today's Date: 11/22/2021   History of present illness Patient is 65 y.o. 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 Harlow Asa 11/17/21. PMH significant for anemia, OA, GERD, HLD.   OT comments  Pt with limited progress this session. Difficulty following one-step commands, needed to be reoriented to s/p abdominal surgery. Pt actively resisting trunk movement, appears surprised by pain. Planned to do grooming session at sink, ultimately unable to achieve EOB position with +1 assist this session. D/c plan remains appropriate.    Recommendations for follow up therapy are one component of a multi-disciplinary discharge planning process, led by the attending physician.  Recommendations may be updated based on patient status, additional functional criteria and insurance authorization.    Follow Up Recommendations  Skilled nursing-short term rehab (<3 hours/day)    Assistance Recommended at Discharge Frequent or constant Supervision/Assistance  Equipment Recommendations  BSC/3in1;Tub/shower seat    Recommendations for Other Services      Precautions / Restrictions Precautions Precautions: Fall Precaution Comments: abdominal surgery w/ wound vac, Rt/Lt colostomy. Restrictions Weight Bearing Restrictions: No       Mobility Bed Mobility Overal bed mobility: Needs Assistance Bed Mobility: Rolling;Supine to Sit Rolling: +2 for physical assistance   Supine to sit: +2 for physical assistance     General bed mobility comments: ultimately unable to successfully complete with +1 assist.    Transfers                   General transfer comment: unable to assess     Balance       Sitting balance - Comments: unable to assess                                   ADL either performed  or assessed with clinical judgement   ADL Overall ADL's : Needs assistance/impaired                                       General ADL Comments: Cognition and pain a limiting progress this session. Pt not following one step commands consistently. resisting movement, premedicated. Attempted rolling to side but resisting more than partial trunk movement. Attempted supine> EOB, made it to about 50% of the way there. Premedicated with Robaxin prior and Diluadid during session.    Extremity/Trunk Assessment Upper Extremity Assessment Upper Extremity Assessment: Generalized weakness;RUE deficits/detail;LUE deficits/detail;Difficult to assess due to impaired cognition RUE Deficits / Details: Pt is guarding and only tolerated raising arm at shoulders to ~ 80 degrees. Distal ROM and strength intact. LUE Deficits / Details: Pt is guarding and only tolerated raising arm at shoulders to ~ 80 degrees. Distal ROM and strength intact.   Lower Extremity Assessment Lower Extremity Assessment: Defer to PT evaluation        Vision       Perception     Praxis      Cognition Arousal/Alertness: Awake/alert Behavior During Therapy: Anxious;WFL for tasks assessed/performed Overall Cognitive Status: No family/caregiver present to determine baseline cognitive functioning Area of Impairment: Orientation;Attention;Memory;Following commands;Safety/judgement;Awareness;Problem solving                 Orientation Level: Place;Time;Situation Current Attention Level: Focused Memory: Decreased recall  of precautions;Decreased short-term memory Following Commands: Follows one step commands inconsistently;Follows one step commands with increased time Safety/Judgement: Decreased awareness of safety;Decreased awareness of deficits Awareness: Intellectual Problem Solving: Decreased initiation;Slow processing;Difficulty sequencing;Requires verbal cues;Requires tactile cues General Comments:  place: "rehab" date:"the second" "2022" "November" Delayed responses. situation "I really don't know why I'm here." Reoriented pt to recent abdominal surgery          Exercises     Shoulder Instructions       General Comments      Pertinent Vitals/ Pain       Pain Assessment: Faces Faces Pain Scale: Hurts whole lot Pain Location: abdomen with rolling Pain Descriptors / Indicators: Aching;Discomfort;Grimacing;Guarding Pain Intervention(s): Monitored during session;Premedicated before session;Repositioned;Limited activity within patient's tolerance;Utilized relaxation techniques  Home Living                                          Prior Functioning/Environment              Frequency  Min 2X/week        Progress Toward Goals  OT Goals(current goals can now be found in the care plan section)  Progress towards OT goals: Not progressing toward goals - comment (limited by cognition + pain and fatigue)  Acute Rehab OT Goals Patient Stated Goal: To go home for peace and quiet. OT Goal Formulation: With patient Time For Goal Achievement: 12/03/21 Potential to Achieve Goals: Fair ADL Goals Pt Will Perform Grooming: standing;with modified independence Pt Will Perform Lower Body Bathing: with modified independence;with adaptive equipment;sitting/lateral leans Pt Will Perform Lower Body Dressing: sit to/from stand;sitting/lateral leans;with adaptive equipment;with modified independence Pt Will Transfer to Toilet: ambulating;with modified independence Pt Will Perform Toileting - Clothing Manipulation and hygiene: with modified independence;with adaptive equipment;sitting/lateral leans;sit to/from stand Additional ADL Goal #1: Pt will improve sitting balance to good static and fair+ dynamic in order to increase safety and participation during seated ADLs.  Plan Discharge plan remains appropriate    Co-evaluation                 AM-PAC OT "6 Clicks"  Daily Activity     Outcome Measure   Help from another person eating meals?: A Little Help from another person taking care of personal grooming?: A Little Help from another person toileting, which includes using toliet, bedpan, or urinal?: Total Help from another person bathing (including washing, rinsing, drying)?: Total Help from another person to put on and taking off regular upper body clothing?: Total Help from another person to put on and taking off regular lower body clothing?: Total 6 Click Score: 10    End of Session    OT Visit Diagnosis: Unsteadiness on feet (R26.81);Other symptoms and signs involving cognitive function;Pain;History of falling (Z91.81);Repeated falls (R29.6);Muscle weakness (generalized) (M62.81)   Activity Tolerance Patient limited by pain;Patient limited by fatigue;Other (comment) (confusion/decreased cognition)   Patient Left with call bell/phone within reach;in bed;with bed alarm set   Nurse Communication Patient requests pain meds;Mobility status;Other (comment) (cognition)        Time: 2355-7322 OT Time Calculation (min): 40 min  Charges: OT General Charges $OT Visit: 1 Visit OT Treatments $Therapeutic Activity: 38-52 mins  Tyrone Schimke, OT Acute Rehabilitation Services Pager: [phone] Office: [phone]   Hortencia Pilar 11/22/2021, 11:49 AM

## 2021-11-22 NOTE — Consult Note (Signed)
[place] Nurse wound follow up Wound type:surgical  Measurement: 17cm x 6cm x 5cm  Wound bed: pale, subcutaneous, early onset slough in the base  Drainage (amount, consistency, odor) scant  Periwound: intact; MF left lateral wound edge/ileostomy right lateral wound edge   Dressing procedure/placement/frequency: Removed old NPWT dressing Using 1/2 of 2" skin barrier ring at umbilicus  Filled wound with  __2_ piece of black foam Sealed NPWT dressing at 163mm HG Patient received IV pain medication per bedside nurse prior to dressing change Patient tolerated procedure well WOC nurse will continue to provide NPWT dressing changed due to the complexity of the dressing change.   [place] Nurse ostomy follow up Stoma type/location/size  RLQ: ileostomy; 1 1/2" round, budded; pink, moist  LLQ: mucous fistula; 1 1/4" round, flush Peristomal assessment: intact; RLQ stoma  Treatment options for stomal/peristomal skin: 2" skin barrier/RLQ stoma  Output liquid green/brown RLQ Ostomy pouching: 2pc. 2 1/4" with 2" skin barrier ring 1pc flat to the MF/not changed today Education provided:  Reviewed pouch change with patient; she has no recall of previous pouch change Enrolled patient in [employer] Discharge program: Yes  Patient with questionable dementia, she has no recall for dressing change or ostomy care. Discussed with CCM. Will need SNF at DC for care of NPWT and stomas.  2pc 2 1/4" pouches and barrier rings in the room along with VAC supplies ordered for next week.  Plans for DC to SNF once approval for bed/NPWT home device to be sent to hospital per SNF request and hooked to patient at DC to transfer to SNF.   WOC nursing will follow along for support with NPWT dressings and ostomy care due to the proximity to the stomas.  Patient is not able to learn care.  [place], [place], [place]

## 2021-11-22 NOTE — TOC Progression Note (Signed)
Transition of Care [facility]) - Progression Note    Patient Details  Name: Sherry Hampton MRN: [id number] Date of Birth: [birth year]  Transition of Care [facility]) CM/SW [place], [place] Phone Number: 11/22/2021, 9:00 AM  Clinical Narrative:   Received confirmation that wound vac will be delivered tomorrow.  Facility working on [employer].  Do not anticipate clearance to d/c until next week. TOC will continue to follow during the course of hospitalization.     Expected Discharge Plan: [place] Barriers to Discharge: Continued Medical Work up, Ship [employer], SNF Pending bed offer  Expected Discharge Plan and Services Expected Discharge Plan: [place] In-house Referral: Clinical Social Work     Living arrangements for the past 2 months: Single Family Home                                       Social Determinants of Health (SDOH) Interventions    Readmission Risk Interventions No flowsheet data found.

## 2021-11-22 NOTE — Progress Notes (Signed)
Physical Therapy Treatment Patient Details Name: Sherry Hampton MRN: [id number] DOB: August 21, 1956 Today's Date: 11/22/2021   History of Present Illness Patient is 65 y.o. 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 Harlow Asa 11/17/21. PMH significant for anemia, OA, GERD, HLD.    PT Comments    Pt continues agreeable but with decreased initiation, increased confusion and difficulty problem solving.  Pt seen with OT and this session pt able to come to standing position x 3.   Mod to max +2 assist for bed mobility and sit<>stand. Extra time and effort. Step-by-step, verbal, and tactile cueing. Heavy posterior lean once EOB, pt with pain avoidant behaviors. Premedicated. D/c plan remains appropriate.      Recommendations for follow up therapy are one component of a multi-disciplinary discharge planning process, led by the attending physician.  Recommendations may be updated based on patient status, additional functional criteria and insurance authorization.  Follow Up Recommendations  Skilled nursing-short term rehab (<3 hours/day)     Assistance Recommended at Discharge Frequent or constant Supervision/Assistance  Equipment Recommendations  None recommended by PT    Recommendations for Other Services OT consult     Precautions / Restrictions Precautions Precautions: Fall Precaution Comments: abdominal surgery w/ wound vac, Rt/Lt colostomy. Restrictions Weight Bearing Restrictions: No     Mobility  Bed Mobility Overal bed mobility: Needs Assistance Bed Mobility: Sit to Supine;Rolling;Sidelying to Sit Rolling: Mod assist;+2 for physical assistance Sidelying to sit: +2 for physical assistance;Max assist Supine to sit: Max assist;+2 for physical assistance Sit to supine: Max assist;+2 for physical assistance;+2 for safety/equipment   General bed mobility comments: step-by-step cueing. assist for  all aspects. Pt resistant at times d/t increased pain with movement. Heavy posterior lean.    Transfers Overall transfer level: Needs assistance Equipment used: Rolling walker (2 wheels) Transfers: Sit to/from Stand Sit to Stand: +2 physical assistance;Min assist;Mod assist;From elevated surface           General transfer comment: elevated bed height, step-by-step cueing. verbal and tactile cues. Mod A +2 on initial stand, min A +2 on second stand. Assist for all aspects. Posterior lean. pain avoidant behaviors impacting balance and assist level.  Pt stood x 3    Ambulation/Gait               General Gait Details: Pt stood at bedside x 3 but unable to initiate step with either LE.   Stairs             Wheelchair Mobility    Modified Rankin (Stroke Patients Only)       Balance Overall balance assessment: Needs assistance Sitting-balance support: Feet supported;Bilateral upper extremity supported Sitting balance-Leahy Scale: Poor Sitting balance - Comments: BUE and up to mod-max A d/t posterior lean. pt avoiding trunk flexion. Postural control: Posterior lean Standing balance support: Reliant on assistive device for balance;During functional activity;Bilateral upper extremity supported Standing balance-Leahy Scale: Poor                              Cognition Arousal/Alertness: Awake/alert Behavior During Therapy: Flat affect;Anxious Overall Cognitive Status: No family/caregiver present to determine baseline cognitive functioning Area of Impairment: Orientation;Attention;Memory;Following commands;Safety/judgement;Awareness;Problem solving                 Orientation Level: Place;Time;Situation Current Attention Level: Focused Memory: Decreased recall of precautions;Decreased short-term memory Following Commands: Follows one  step commands inconsistently;Follows one step commands with increased time Safety/Judgement: Decreased awareness of  safety;Decreased awareness of deficits Awareness: Intellectual Problem Solving: Decreased initiation;Slow processing;Difficulty sequencing;Requires verbal cues;Requires tactile cues General Comments: place: "rehab" date:"the second" "2022" "November" Delayed responses. situation "I really don't know why I'm here." Reoriented pt to recent abdominal surgery        Exercises      General Comments        Pertinent Vitals/Pain Pain Assessment: Faces Faces Pain Scale: Hurts even more Pain Location: abdomen Pain Descriptors / Indicators: Aching;Discomfort;Grimacing;Guarding Pain Intervention(s): Limited activity within patient's tolerance;Monitored during session;Premedicated before session    Home Living                          Prior Function            PT Goals (current goals can now be found in the care plan section) Acute Rehab PT Goals PT Goal Formulation: With patient Time For Goal Achievement: 12/02/21 Potential to Achieve Goals: Good Progress towards PT goals: Not progressing toward goals - comment (Pt with decreased initiation and increased confusion)    Frequency    7X/week      PT Plan Current plan remains appropriate    Co-evaluation PT/OT/SLP Co-Evaluation/Treatment: Yes Reason for Co-Treatment: For patient/therapist safety;To address functional/ADL transfers PT goals addressed during session: Mobility/safety with mobility OT goals addressed during session: ADL's and self-care      AM-PAC PT "6 Clicks" Mobility   Outcome Measure  Help needed turning from your back to your side while in a flat bed without using bedrails?: A Lot Help needed moving from lying on your back to sitting on the side of a flat bed without using bedrails?: A Lot Help needed moving to and from a bed to a chair (including a wheelchair)?: A Lot Help needed standing up from a chair using your arms (e.g., wheelchair or bedside chair)?: A Lot Help needed to walk in  hospital room?: Total Help needed climbing 3-5 steps with a railing? : Total 6 Click Score: 10    End of Session Equipment Utilized During Treatment: Gait belt Activity Tolerance: Patient limited by fatigue Patient left: in bed;with call bell/phone within reach;with bed alarm set Nurse Communication: Mobility status PT Visit Diagnosis: Muscle weakness (generalized) (M62.81);Difficulty in walking, not elsewhere classified (R26.2);Other abnormalities of gait and mobility (R26.89)     Time: 4917-9150 PT Time Calculation (min) (ACUTE ONLY): 33 min  Charges:  $Therapeutic Activity: 8-22 mins                     Debe Coder PT Acute Rehabilitation Services Pager [phone] Office [phone]    [facility] 11/22/2021, 12:31 PM

## 2021-11-22 NOTE — Progress Notes (Addendum)
Occupational Therapy Treatment Patient Details Name: Sherry Hampton MRN: [id number] DOB: 1956-06-05 Today's Date: 11/22/2021   History of present illness Patient is 65 y.o. 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 Harlow Asa 11/17/21. PMH significant for anemia, OA, GERD, HLD.   OT comments  Pt seen for follow-up session together with PT. This session pt able to come to standing position twice. Mod to max +2 assist for bed mobility and sit<>stand. Extra time and effort. Step-by-step, verbal, and tactile cueing. Heavy posterior lean once EOB, pt with pain avoidant behaviors. Premedicated. D/c plan remains appropriate.    Recommendations for follow up therapy are one component of a multi-disciplinary discharge planning process, led by the attending physician.  Recommendations may be updated based on patient status, additional functional criteria and insurance authorization.    Follow Up Recommendations  Skilled nursing-short term rehab (<3 hours/day)    Assistance Recommended at Discharge Frequent or constant Supervision/Assistance  Equipment Recommendations  Other (comment) (deferto next venue)    Recommendations for Other Services      Precautions / Restrictions Precautions Precautions: Fall Precaution Comments: abdominal surgery w/ wound vac, Rt/Lt colostomy. Restrictions Weight Bearing Restrictions: No       Mobility Bed Mobility Overal bed mobility: Needs Assistance Bed Mobility: Sit to Supine;Rolling;Sidelying to Sit Rolling: Mod assist;+2 for physical assistance Sidelying to sit: +2 for physical assistance;Max assist Supine to sit: Max assist;+2 for physical assistance     General bed mobility comments: step-by-step cueing. assist for all aspects. Pt resistant at times d/t increased pain with movement. Heavy posterior lean.    Transfers Overall transfer level: Needs  assistance Equipment used: Rolling walker (2 wheels) Transfers: Sit to/from Stand Sit to Stand: +2 physical assistance;Min assist;Mod assist;From elevated surface           General transfer comment: elevated bed height, step-by-step cueing. verbal and tactile cues. Mod A +2 on initial stand, min A +2 on second stand. Assist for all aspects. Posterior lean. pain avoidant behaviors impacting balance and assist level.     Balance Overall balance assessment: Needs assistance Sitting-balance support: Feet supported;Bilateral upper extremity supported Sitting balance-Leahy Scale: Poor Sitting balance - Comments: BUE and up to mod-max A d/t posterior lean. pt avoiding trunk flexion. Postural control: Posterior lean Standing balance support: Reliant on assistive device for balance;During functional activity;Bilateral upper extremity supported Standing balance-Leahy Scale: Poor                             ADL either performed or assessed with clinical judgement   ADL Overall ADL's : Needs assistance/impaired                                       General ADL Comments: Cognition and pain a limiting progress this session. Pt not following one step commands consistently. resisting movement, premedicated. Attempted rolling to side but resisting more than partial trunk movement. Attempted supine> EOB, made it to about 50% of the way there. Premedicated with Robaxin prior and Diluadid during session.    Extremity/Trunk Assessment Upper Extremity Assessment Upper Extremity Assessment: Generalized weakness;RUE deficits/detail;LUE deficits/detail RUE Deficits / Details: Pt is guarding and only tolerated raising arm at shoulders to ~ 80 degrees. Distal ROM and strength intact. LUE Deficits / Details: Pt is guarding and  only tolerated raising arm at shoulders to ~ 80 degrees. Distal ROM and strength intact.   Lower Extremity Assessment Lower Extremity Assessment: Defer to  PT evaluation        Vision       Perception     Praxis      Cognition Arousal/Alertness: Awake/alert Behavior During Therapy: Flat affect;Anxious Overall Cognitive Status: No family/caregiver present to determine baseline cognitive functioning Area of Impairment: Orientation;Attention;Memory;Following commands;Safety/judgement;Awareness;Problem solving                 Orientation Level: Place;Time;Situation Current Attention Level: Focused Memory: Decreased recall of precautions;Decreased short-term memory Following Commands: Follows one step commands inconsistently;Follows one step commands with increased time Safety/Judgement: Decreased awareness of safety;Decreased awareness of deficits Awareness: Intellectual Problem Solving: Decreased initiation;Slow processing;Difficulty sequencing;Requires verbal cues;Requires tactile cues          Exercises     Shoulder Instructions       General Comments      Pertinent Vitals/ Pain       Pain Assessment: Faces Faces Pain Scale: Hurts even more Pain Location: abdomen Pain Descriptors / Indicators: Aching;Discomfort;Grimacing;Guarding Pain Intervention(s): Monitored during session;Repositioned;Limited activity within patient's tolerance;Premedicated before session  Home Living                                          Prior Functioning/Environment              Frequency  Min 2X/week        Progress Toward Goals  OT Goals(current goals can now be found in the care plan section)  Progress towards OT goals: Progressing toward goals  Acute Rehab OT Goals Patient Stated Goal: To go home for peace and quiet. OT Goal Formulation: With patient Time For Goal Achievement: 12/03/21 Potential to Achieve Goals: Fair ADL Goals Pt Will Perform Grooming: standing;with modified independence Pt Will Perform Lower Body Bathing: with modified independence;with adaptive equipment;sitting/lateral  leans Pt Will Perform Lower Body Dressing: sit to/from stand;sitting/lateral leans;with adaptive equipment;with modified independence Pt Will Transfer to Toilet: ambulating;with modified independence Pt Will Perform Toileting - Clothing Manipulation and hygiene: with modified independence;with adaptive equipment;sitting/lateral leans;sit to/from stand Additional ADL Goal #1: Pt will improve sitting balance to good static and fair+ dynamic in order to increase safety and participation during seated ADLs.  Plan Discharge plan remains appropriate    Co-evaluation                 AM-PAC OT "6 Clicks" Daily Activity     Outcome Measure   Help from another person eating meals?: A Little Help from another person taking care of personal grooming?: A Little Help from another person toileting, which includes using toliet, bedpan, or urinal?: Total Help from another person bathing (including washing, rinsing, drying)?: Total Help from another person to put on and taking off regular upper body clothing?: Total Help from another person to put on and taking off regular lower body clothing?: Total 6 Click Score: 10    End of Session Equipment Utilized During Treatment: Rolling walker (2 wheels);Gait belt  OT Visit Diagnosis: Unsteadiness on feet (R26.81);Other symptoms and signs involving cognitive function;Pain;History of falling (Z91.81);Repeated falls (R29.6);Muscle weakness (generalized) (M62.81)   Activity Tolerance Patient limited by pain;Patient limited by fatigue;Other (comment) (impaired cognition)   Patient Left in bed;with call bell/phone within reach;with bed alarm set   Nurse  Communication Mobility status;Other (comment) (NT)        Time: 1003-4961 OT Time Calculation (min): 33 min  Charges: OT General Charges $OT Visit: 1 Visit OT Treatments $Self Care/Home Management : 8-22 mins   Tyrone Schimke, OT Acute Rehabilitation Services Pager: [phone] Office:  [phone]   Hortencia Pilar 11/22/2021, 12:09 PM

## 2021-11-22 NOTE — Progress Notes (Addendum)
5 Days Post-Op  Subjective: Patient states she doesn't feel well today.  Upon further evaluation it is with movement, as expected.  Not eating much, but no nausea.    ROS: See above, otherwise other systems negative  Objective: Vital signs in last 24 hours: Temp:  [97.6 F (36.4 C)-98.1 F (36.7 C)] 97.6 F (36.4 C) (12/02 0443) Pulse Rate:  [84-93] 84 (12/02 0443) Resp:  [18] 18 (12/02 0443) BP: (108-121)/(63-74) 115/72 (12/02 0443) SpO2:  [97 %-98 %] 97 % (12/02 0443) Last BM Date: 11/22/21  Intake/Output from previous day: 12/01 0701 - 12/02 0700 In: 1971 [P.O.:240; I.V.:1579.3; IV Piggyback:151.7] Out: 1300 [Urine:150; Stool:1150] Intake/Output this shift: Total I/O In: -  Out: 550 [Urine:500; Stool:50]  PE: Abd: soft, appropriately tender, midline wound with VAC in place, RLQ ileostomy with pink stoma and some thin bilious liquid present.  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 GU: yellow urine present in purewick Psych: orientation questions able to be answered, but patient does not recall situations or discussions from previous days  Lab Results:  Recent Labs    11/20/21 0440  WBC 13.6*  HGB 9.6*  HCT 28.8*  PLT 250   BMET No results for input(s): NA, K, CL, CO2, GLUCOSE, BUN, CREATININE, CALCIUM in the last 72 hours.  PT/INR No results for input(s): LABPROT, INR in the last 72 hours. CMP     Component Value Date/Time   NA 139 11/19/2021 0432   K 3.9 11/19/2021 0432   CL 107 11/19/2021 0432   CO2 26 11/19/2021 0432   GLUCOSE 94 11/19/2021 0432   BUN 20 11/19/2021 0432   CREATININE 0.87 11/19/2021 0432   CALCIUM 9.9 11/19/2021 0432   PROT 7.7 11/17/2021 0955   ALBUMIN 3.9 11/17/2021 0955   AST 28 11/17/2021 0955   ALT 14 11/17/2021 0955   ALKPHOS 68 11/17/2021 0955   BILITOT 1.7 (H) 11/17/2021 0955   GFRNONAA >60 11/19/2021 0432   Lipase     Component Value Date/Time   LIPASE 22 11/17/2021 0955       Studies/Results: No  results found.  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 Harlow Asa 11/27 -voiding -regular diet, BID metamucil, BID imodium, output 1100cc -add Ensure today -cont VAC, MWF change -WOC consult for ileostomy, mucous fistula, wound VAC -cont abx due to feculent peritonitis for 5 days.  Check cbc in am -mobilize/pulm toilet -PT/OT recommend SNF, TOC consult. -pathology benign  FEN - regular diet, IVFs@50cc , monitor ileostomy output VTE - lovenox ID - zosyn [id number]  ? Metabolic encephalopathy vs early dementia - patient does not recall our conversations from day to day regarding her care.  RNs and techs reiterate the same issues in their interactions.  WOC RN saw today and patient does not recall the process of having her VAC placed on Wednesday either.  Will continue to monitor.  Will  likely need some outpatient follow up.   LOS: 5 days   Henreitta Cea, [facility], [facility]

## 2021-11-23 LAB — CBC
HCT: 30.4 % — ABNORMAL LOW (ref 36.0–46.0)
Hemoglobin: 10.2 g/dL — ABNORMAL LOW (ref 12.0–15.0)
MCH: 30.7 pg (ref 26.0–34.0)
MCHC: 33.6 g/dL (ref 30.0–36.0)
MCV: 91.6 fL (ref 80.0–100.0)
Platelets: 329 10*3/uL (ref 150–400)
RBC: 3.32 MIL/uL — ABNORMAL LOW (ref 3.87–5.11)
RDW: 15.2 % (ref 11.5–15.5)
WBC: 16.2 10*3/uL — ABNORMAL HIGH (ref 4.0–10.5)
nRBC: 0.1 % (ref 0.0–0.2)

## 2021-11-23 NOTE — Progress Notes (Signed)
    6 Days Post-Op  Subjective: Patient states she feels ok.  Not eating much, but no nausea.    ROS: See above, otherwise other systems negative  Objective: Vital signs in last 24 hours: Temp:  [97.4 F (36.3 C)-98.3 F (36.8 C)] 97.4 F (36.3 C) (12/03 0453) Pulse Rate:  [70-99] 70 (12/03 0453) Resp:  [16-18] 18 (12/03 0453) BP: (102-130)/(74-84) 102/84 (12/03 0453) SpO2:  [94 %-99 %] 99 % (12/03 0453) Last BM Date: 11/22/21  Intake/Output from previous day: 12/02 0701 - 12/03 0700 In: 1234.4 [P.O.:420; I.V.:744.2; IV Piggyback:70.2] Out: 1400 [Urine:950; Stool:450] Intake/Output this shift: No intake/output data recorded.  PE: Abd: soft, appropriately tender, midline wound with VAC in place, RLQ ileostomy with pink stoma and some thin bilious liquid present.  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  Lab Results:  Recent Labs    11/23/21 0527  WBC 16.2*  HGB 10.2*  HCT 30.4*  PLT 329    BMET No results for input(s): NA, K, CL, CO2, GLUCOSE, BUN, CREATININE, CALCIUM in the last 72 hours.  PT/INR No results for input(s): LABPROT, INR in the last 72 hours. CMP     Component Value Date/Time   NA 139 11/19/2021 0432   K 3.9 11/19/2021 0432   CL 107 11/19/2021 0432   CO2 26 11/19/2021 0432   GLUCOSE 94 11/19/2021 0432   BUN 20 11/19/2021 0432   CREATININE 0.87 11/19/2021 0432   CALCIUM 9.9 11/19/2021 0432   PROT 7.7 11/17/2021 0955   ALBUMIN 3.9 11/17/2021 0955   AST 28 11/17/2021 0955   ALT 14 11/17/2021 0955   ALKPHOS 68 11/17/2021 0955   BILITOT 1.7 (H) 11/17/2021 0955   GFRNONAA >60 11/19/2021 0432   Lipase     Component Value Date/Time   LIPASE 22 11/17/2021 0955       Studies/Results: No results found.  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 Harlow Asa 11/27 -voiding -regular diet, BID metamucil, BID imodium, output 1100cc -Cont Ensure  -WOC consult for ileostomy, mucous fistula, wound VAC, MWF change -cont abx due to feculent peritonitis.  Check cbc in am -mobilize/pulm toilet.  WBC increasing- will check UA today -PT/OT recommend SNF, TOC consult. -pathology benign  FEN - regular diet, IVFs@50cc , monitor ileostomy output VTE - lovenox ID - zosyn     LOS: 6 days   Rosario Adie, MD [facility], [place]

## 2021-11-24 ENCOUNTER — Inpatient Hospital Stay (HOSPITAL_COMMUNITY): Payer: Medicare HMO

## 2021-11-24 DIAGNOSIS — K5730 Diverticulosis of large intestine without perforation or abscess without bleeding: Secondary | ICD-10-CM | POA: Diagnosis not present

## 2021-11-24 DIAGNOSIS — D251 Intramural leiomyoma of uterus: Secondary | ICD-10-CM | POA: Diagnosis not present

## 2021-11-24 DIAGNOSIS — Z872 Personal history of diseases of the skin and subcutaneous tissue: Secondary | ICD-10-CM | POA: Diagnosis not present

## 2021-11-24 DIAGNOSIS — N200 Calculus of kidney: Secondary | ICD-10-CM | POA: Diagnosis not present

## 2021-11-24 LAB — CBC
HCT: 34.6 % — ABNORMAL LOW (ref 36.0–46.0)
Hemoglobin: 11.6 g/dL — ABNORMAL LOW (ref 12.0–15.0)
MCH: 30.9 pg (ref 26.0–34.0)
MCHC: 33.5 g/dL (ref 30.0–36.0)
MCV: 92 fL (ref 80.0–100.0)
Platelets: 365 10*3/uL (ref 150–400)
RBC: 3.76 MIL/uL — ABNORMAL LOW (ref 3.87–5.11)
RDW: 15.4 % (ref 11.5–15.5)
WBC: 15.9 10*3/uL — ABNORMAL HIGH (ref 4.0–10.5)
nRBC: 0.1 % (ref 0.0–0.2)

## 2021-11-24 LAB — CREATININE, SERUM
Creatinine, Ser: 0.63 mg/dL (ref 0.44–1.00)
GFR, Estimated: >60 mL/min (ref >60)

## 2021-11-24 MED ORDER — PANTOPRAZOLE SODIUM 40 MG PO TBEC
40.0000 mg | DELAYED_RELEASE_TABLET | Freq: Every day | ORAL | Status: DC
  Administered 2021-11-24 – 2022-01-20 (×51): 40 mg via ORAL
  Filled 2021-11-24 (×55): qty 1

## 2021-11-24 MED ORDER — IOHEXOL 350 MG/ML SOLN
80.0000 mL | Freq: Once | INTRAVENOUS | Status: AC | PRN
  Administered 2021-11-24: 15:00:00 80 mL via INTRAVENOUS

## 2021-11-24 MED ORDER — IOHEXOL 9 MG/ML PO SOLN
500.0000 mL | ORAL | Status: AC
  Administered 2021-11-24 (×2): 500 mL via ORAL

## 2021-11-24 NOTE — Progress Notes (Signed)
Pts son Milbert Coulter requests a call from the MD in the AM for an update on pt. His number is [phone].

## 2021-11-24 NOTE — Progress Notes (Signed)
    7 Days Post-Op  Subjective: Patient states she feels ok.  Not eating much, but no nausea.    Objective: Vital signs in last 24 hours: Temp:  [97.3 F (36.3 C)-98.3 F (36.8 C)] 97.3 F (36.3 C) (12/04 0507) Pulse Rate:  [74-95] 95 (12/04 0507) Resp:  [14-16] 16 (12/04 0507) BP: (104-136)/(61-79) 136/79 (12/04 0507) SpO2:  [92 %-94 %] 94 % (12/04 0507) Last BM Date: 11/23/21  Intake/Output from previous day: 12/03 0701 - 12/04 0700 In: 2019.2 [P.O.:840; I.V.:1179.2] Out: 1100 [Urine:800; Stool:300] Intake/Output this shift: No intake/output data recorded.  PE: Abd: soft, appropriately tender, midline wound with VAC in place, RLQ ileostomy with pink stoma and some thin bilious liquid present.  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  Lab Results:  Recent Labs    11/23/21 0527 11/24/21 0446  WBC 16.2* 15.9*  HGB 10.2* 11.6*  HCT 30.4* 34.6*  PLT 329 365    BMET Recent Labs    11/24/21 0446  CREATININE 0.63    PT/INR No results for input(s): LABPROT, INR in the last 72 hours. CMP     Component Value Date/Time   NA 139 11/19/2021 0432   K 3.9 11/19/2021 0432   CL 107 11/19/2021 0432   CO2 26 11/19/2021 0432   GLUCOSE 94 11/19/2021 0432   BUN 20 11/19/2021 0432   CREATININE 0.63 11/24/2021 0446   CALCIUM 9.9 11/19/2021 0432   PROT 7.7 11/17/2021 0955   ALBUMIN 3.9 11/17/2021 0955   AST 28 11/17/2021 0955   ALT 14 11/17/2021 0955   ALKPHOS 68 11/17/2021 0955   BILITOT 1.7 (H) 11/17/2021 0955   GFRNONAA >60 11/24/2021 0446   Lipase     Component Value Date/Time   LIPASE 22 11/17/2021 0955       Studies/Results: No results found.  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 Harlow Asa 11/27 -voiding -regular diet, BID metamucil, ileostomy output decreased, d/c imodium  -Cont Ensure  -WOC consult for ileostomy, mucous fistula, wound VAC, MWF change -cont abx due to feculent peritonitis.    WBC increased- UA today not collected yet.  Will get CT today to eval for abscess given clinical ileus picture ane elevated wbc -PT/OT recommend SNF, TOC consult. -pathology benign  FEN - regular diet, IVFs@50cc , monitor ileostomy output VTE - lovenox ID - zosyn     LOS: 7 days   Rosario Adie, MD [facility], [place]

## 2021-11-25 ENCOUNTER — Inpatient Hospital Stay (HOSPITAL_COMMUNITY): Payer: Medicare HMO

## 2021-11-25 DIAGNOSIS — R4182 Altered mental status, unspecified: Secondary | ICD-10-CM | POA: Diagnosis not present

## 2021-11-25 LAB — BASIC METABOLIC PANEL
Anion gap: 3 — ABNORMAL LOW (ref 5–15)
BUN: 8 mg/dL (ref 8–23)
CO2: 23 mmol/L (ref 22–32)
Calcium: 9.3 mg/dL (ref 8.9–10.3)
Chloride: 107 mmol/L (ref 98–111)
Creatinine, Ser: 0.44 mg/dL (ref 0.44–1.00)
GFR, Estimated: >60 mL/min (ref >60)
Glucose, Bld: 107 mg/dL — ABNORMAL HIGH (ref 70–99)
Potassium: 4.3 mmol/L (ref 3.5–5.1)
Sodium: 133 mmol/L — ABNORMAL LOW (ref 135–145)

## 2021-11-25 LAB — URINALYSIS, ROUTINE W REFLEX MICROSCOPIC
Bilirubin Urine: NEGATIVE
Glucose, UA: NEGATIVE mg/dL
Hgb urine dipstick: NEGATIVE
Ketones, ur: NEGATIVE mg/dL
Leukocytes,Ua: NEGATIVE
Nitrite: NEGATIVE
Protein, ur: NEGATIVE mg/dL
Specific Gravity, Urine: 1.026 (ref 1.005–1.030)
pH: 6 (ref 5.0–8.0)

## 2021-11-25 LAB — CBC
HCT: 30.7 % — ABNORMAL LOW (ref 36.0–46.0)
Hemoglobin: 10.6 g/dL — ABNORMAL LOW (ref 12.0–15.0)
MCH: 30.5 pg (ref 26.0–34.0)
MCHC: 34.5 g/dL (ref 30.0–36.0)
MCV: 88.2 fL (ref 80.0–100.0)
Platelets: 407 10*3/uL — ABNORMAL HIGH (ref 150–400)
RBC: 3.48 MIL/uL — ABNORMAL LOW (ref 3.87–5.11)
RDW: 15.2 % (ref 11.5–15.5)
WBC: 15.4 10*3/uL — ABNORMAL HIGH (ref 4.0–10.5)
nRBC: 0.3 % — ABNORMAL HIGH (ref 0.0–0.2)

## 2021-11-25 MED ORDER — PIPERACILLIN-TAZOBACTAM 3.375 G IVPB
3.3750 g | Freq: Three times a day (TID) | INTRAVENOUS | Status: DC
  Administered 2021-11-25 – 2021-12-01 (×18): 3.375 g via INTRAVENOUS
  Filled 2021-11-25 (×18): qty 50

## 2021-11-25 NOTE — Progress Notes (Signed)
8 Days Post-Op  Subjective: CC: Patient states she has some abdominal pain but can't specify where. She states she did not eat yesterday. RN in the room and reports she ate minimally the last 2 days. No emesis. Having ostomy output. Last PT session 12/2. Voiding with purwick.   Patient seen with wocn for dressing changes.   Objective: Vital signs in last 24 hours: Temp:  [97.4 F (36.3 C)-99.9 F (37.7 C)] 98.3 F (36.8 C) (12/05 0542) Pulse Rate:  [76-106] 76 (12/05 0542) Resp:  [18] 18 (12/05 0542) BP: (113-152)/(57-77) 120/62 (12/05 0542) SpO2:  [96 %] 96 % (12/05 0542) Last BM Date: 11/23/21  Intake/Output from previous day: 12/04 0701 - 12/05 0700 In: 120 [P.O.:120] Out: 1855 [Urine:1300; Stool:355] Intake/Output this shift: Total I/O In: -  Out: 500 [Urine:500]  PE: Gen:  Alert, NAD, pleasant Card:  Reg Pulm:  CTAB, no W/R/R, effort normal Abd: Soft, mild distension, generalized tenderness that appears greater on the right without rigidity or guarding. +BS. Ileostomy with liquid stool and air in bag. Stoma viable. Mucus fistula present. Midline wound seen with wocn with beefy red granulation tissue. No dehiscence. See picture below. Ext:  No LE edema  Skin: no rashes noted, warm and dry      Lab Results:  Recent Labs    11/23/21 0527 11/24/21 0446  WBC 16.2* 15.9*  HGB 10.2* 11.6*  HCT 30.4* 34.6*  PLT 329 365   BMET Recent Labs    11/24/21 0446  CREATININE 0.63   PT/INR No results for input(s): LABPROT, INR in the last 72 hours. CMP     Component Value Date/Time   NA 139 11/19/2021 0432   K 3.9 11/19/2021 0432   CL 107 11/19/2021 0432   CO2 26 11/19/2021 0432   GLUCOSE 94 11/19/2021 0432   BUN 20 11/19/2021 0432   CREATININE 0.63 11/24/2021 0446   CALCIUM 9.9 11/19/2021 0432   PROT 7.7 11/17/2021 0955   ALBUMIN 3.9 11/17/2021 0955   AST 28 11/17/2021 0955   ALT 14 11/17/2021 0955   ALKPHOS 68 11/17/2021 0955   BILITOT 1.7 (H)  11/17/2021 0955   GFRNONAA >60 11/24/2021 0446   Lipase     Component Value Date/Time   LIPASE 22 11/17/2021 0955    Studies/Results: CT ABDOMEN PELVIS W CONTRAST  Result Date: 11/24/2021 CLINICAL DATA:  Intra-abdominal abscess. Status post perforated colon. EXAM: CT ABDOMEN AND PELVIS WITH CONTRAST TECHNIQUE: Multidetector CT imaging of the abdomen and pelvis was performed using the standard protocol following bolus administration of intravenous contrast. CONTRAST:  7mL OMNIPAQUE IOHEXOL 350 MG/ML SOLN COMPARISON:  CT abdomen pelvis 11/17/2021 FINDINGS: Lower chest: Interval development of a trace right pleural effusion. Associated right lower lobe passive atelectasis. Coronary artery calcification. Hepatobiliary: No focal liver abnormality. Query gallbladder sludge. No gallstones, gallbladder wall thickening, or pericholecystic fluid. No biliary dilatation. Stable prominent but not enlarged common bile duct caliber. Pancreas: No focal lesion. Normal pancreatic contour. No surrounding inflammatory changes. No main pancreatic ductal dilatation. Spleen: Normal in size without focal abnormality.  Splenule noted. Adrenals/Urinary Tract: No adrenal nodule bilaterally. Bilateral kidneys enhance symmetrically. Subcentimeter hypodensities too small to characterize. Similar-appearing pericentimeter fluid density lesion within left kidney likely represents a simple renal cyst. Redemonstration of bilateral 1-2 mm nephrolithiasis. No hydronephrosis. No hydroureter. The urinary bladder is unremarkable. Stomach/Bowel: PO contrast reaches the distal ileum. Status post partial colectomy with right lower quadrant end ileostomy formation as well as left lower quadrant  mucous fistula formation of the distal transverse colon. Stomach is within normal limits. Persistent distal descending/proximal sigmoid bowel luminal collapse in the setting of diffuse diverticulosis with limited evaluation. No definite intramural abscess  formation. Appendix appears normal. Vascular/Lymphatic: No abdominal aorta or iliac aneurysm. Mild atherosclerotic plaque of the aorta and its branches. No abdominal, pelvic, or inguinal lymphadenopathy. Reproductive: Coarsely calcified anterior uterine wall lesion measuring up to at least 4 cm consistent with a degenerative uterine fibroid. Adnexa are unremarkable. Other: Interval development of trace volume peripancreatic and right paracolic gutter simple free fluid that extends inferiorly into the right pelvis. There is mild peritoneal wall enhancement/thickening suggestive of superimposed infection. No definite organized fluid collection. Trace residual foci of pneumoperitoneum that is likely postsurgical. Musculoskeletal: No abdominal wall hernia or abnormality. No suspicious lytic or blastic osseous lesions. No acute displaced fracture. Multilevel degenerative changes of the spine. IMPRESSION: 1. Interval development of trace volume peripancreatic and right paracolic gutter simple free fluid that extends inferiorly into the right pelvis. Associated mild peritoneal wall enhancement/thickening suggestive of superimposed infection. Slowly developing abscess is not excluded; however, no definite drainable organized fluid collection/abscess formation noted. 2. Persistent distal descending/proximal sigmoid bowel luminal collapse in the setting of diffuse diverticulosis with limited evaluation. Recommend colonoscopy status post treatment and status post complete resolution of inflammatory changes to exclude an underlying lesion. 3. Status post partial colectomy with right lower quadrant end ileostomy formation as well as left lower quadrant mucous fistula formation of the distal transverse colon. 4. Other imaging findings of potential clinical significance: Query gallbladder sludge formation. Nonobstructive 1-2 mm nephrolithiasis. Degenerative intramural uterine fibroid. Aortic Atherosclerosis (ICD10-I70.0).  Electronically Signed   By: Iven Finn M.D.   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 Harlow Asa 11/27 - CT 12/4 w/ trace peripancreatic and right paracolic gutter simple free fluid that extends inferiorly into the right pelvis with associated peritoneal wall enhancement/thickening. No organized drainable fluid collection.  - Labs pending this am. WBC 15.9 yesterday - Restart Abx - On regular diet + ensure. Minimal oral intake. Nutrition consult.  - Ileostomy output improved. Off Imodium. Cont BID metamucil - WOC following for ileostomy, mucous fistula, wound VAC (MWF change) - Mobilize. PT/OT recommend SNF, TOC consult. - Pathology benign. CT distal descending/proximal sigmoid bowel luminal collapse -- radiology recommending colonoscopy as outpatient.    FEN - Regular diet, ensure, IVFs@50cc , monitor ileostomy output VTE - SCDs, Lovenox ID - zosyn 11/27 - 12/2. 12/5 >>  Foley - External. UA pending.   ? Metabolic encephalopathy vs early dementia - In chart review and talking with staff, appears this is not new. Will continue to monitor.  Will likely need some outpatient follow up.   LOS: 8 days    Jillyn Ledger , [facility] 11/25/2021, 8:50 AM Please see Amion for pager number during day hours  7:00am-4:30pm

## 2021-11-25 NOTE — Progress Notes (Addendum)
Occupational Therapy Treatment Patient Details Name: Sherry Hampton MRN: [id number] DOB: 1956-06-13 Today's Date: 11/25/2021   History of present illness Patient is 65 y.o. 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 Harlow Asa 11/17/21. PMH significant for anemia, OA, GERD, HLD.   OT comments  Pt making poor progress towards acute OT goals. Continues to need +2 mod - max assist to attempt standing (+3 for safety utilized d/t posterior lean). Pt needed more assist today than last OT session on Friday which is concerning given that she was able to walk 180' on 11/30 per PT note that day. Concern for sharp decline in mobility status the past few days. Messaged attending surgical PA. Pt reports no appetite and declined all offers for drink/food this session. Recommend nursing staff utilize [employer] for OOB to [employer].    Recommendations for follow up therapy are one component of a multi-disciplinary discharge planning process, led by the attending physician.  Recommendations may be updated based on patient status, additional functional criteria and insurance authorization.    Follow Up Recommendations  Skilled nursing-short term rehab (<3 hours/day)    Assistance Recommended at Discharge Frequent or constant Supervision/Assistance  Equipment Recommendations  Other (comment) (defer to next venue)    Recommendations for Other Services      Precautions / Restrictions Precautions Precautions: Fall Precaution Comments: abdominal surgery w/ wound vac, Rt/Lt colostomy. Restrictions Weight Bearing Restrictions: No       Mobility Bed Mobility Overal bed mobility: Needs Assistance Bed Mobility: Rolling;Sidelying to Sit;Sit to Supine Rolling: Mod assist;+2 for physical assistance Sidelying to sit: +2 for physical assistance;Max assist   Sit to supine: Max assist;+2 for physical assistance;+2 for  safety/equipment   General bed mobility comments: Multimodal cues and physical assist of 2 for all aspects. Cued for sit>sidelying but ultimately completed as sit>supine. Pt fatigued and asleep at end of session.    Transfers Overall transfer level: Needs assistance Equipment used: Rolling walker (2 wheels) Transfers: Sit to/from Stand Sit to Stand: Max assist;+2 physical assistance;From elevated surface;Mod assist           General transfer comment: Multimodal cues. fatigued quickly. Physical assist for all aspects. Difficulty coming to full upright position. +3 for safety utilized d/t posterior lean EOB.     Balance Overall balance assessment: Needs assistance Sitting-balance support: Feet supported;Bilateral upper extremity supported Sitting balance-Leahy Scale: Poor Sitting balance - Comments: BUE and up to mod-max A d/t posterior lean. pt avoiding trunk flexion. Postural control: Posterior lean Standing balance support: Reliant on assistive device for balance;During functional activity;Bilateral upper extremity supported Standing balance-Leahy Scale: Zero Standing balance comment: rw and up to max +2 assist                           ADL either performed or assessed with clinical judgement   ADL Overall ADL's : Needs assistance/impaired                                       General ADL Comments: Pt continues to struggle with progressing after decline since 11/30 when she was able to walk in the hall with PT. Heavy posterior lean EOB. Multimodal sequencing cues. Stood 2x from EOB with max +2 physical assist (+3 for safety utilized d/t posterior lean tendency and impaired cognition).  Struggled to come to full, upright standing position even with physical assist and multimodal cues. Fatigues quickly.    Extremity/Trunk Assessment Upper Extremity Assessment Upper Extremity Assessment: Generalized weakness;RUE deficits/detail;LUE  deficits/detail;Difficult to assess due to impaired cognition RUE Deficits / Details: Pt is guarding and only tolerated raising arm at shoulders to ~ 80 degrees. Distal ROM and strength intact. LUE Deficits / Details: Pt is guarding and only tolerated raising arm at shoulders to ~ 80 degrees. Distal ROM and strength intact.   Lower Extremity Assessment Lower Extremity Assessment: Defer to PT evaluation        Vision       Perception     Praxis      Cognition Arousal/Alertness: Awake/alert Behavior During Therapy: Flat affect;Anxious Overall Cognitive Status: No family/caregiver present to determine baseline cognitive functioning                                 General Comments: oriented to person only. Inconsistent one-step command following. poor awareness. Able to answer basic yes/no questions with some consistency though often with delayed response. focused attention.          Exercises     Shoulder Instructions       General Comments      Pertinent Vitals/ Pain       Pain Assessment: Faces Faces Pain Scale: Hurts even more Pain Location: abdomen Pain Descriptors / Indicators: Guarding;Moaning;Grimacing Pain Intervention(s): Limited activity within patient's tolerance;Monitored during session;Premedicated before session;Repositioned  Home Living                                          Prior Functioning/Environment              Frequency  Min 2X/week        Progress Toward Goals  OT Goals(current goals can now be found in the care plan section)  Progress towards OT goals: Not progressing toward goals - comment  Acute Rehab OT Goals Patient Stated Goal: To go home for peace and quiet OT Goal Formulation: With patient Time For Goal Achievement: 12/03/21 Potential to Achieve Goals: [place] Discharge plan remains appropriate    Co-evaluation                 AM-PAC OT "6 Clicks" Daily Activity      Outcome Measure   Help from another person eating meals?: A Lot Help from another person taking care of personal grooming?: A Lot Help from another person toileting, which includes using toliet, bedpan, or urinal?: Total Help from another person bathing (including washing, rinsing, drying)?: Total Help from another person to put on and taking off regular upper body clothing?: Total Help from another person to put on and taking off regular lower body clothing?: Total 6 Click Score: 8    End of Session Equipment Utilized During Treatment: Rolling walker (2 wheels);Gait belt  OT Visit Diagnosis: Unsteadiness on feet (R26.81);Other symptoms and signs involving cognitive function;Pain;History of falling (Z91.81);Repeated falls (R29.6);Muscle weakness (generalized) (M62.81)   Activity Tolerance Patient limited by pain;Patient limited by fatigue;Other (comment) (impaired cognition. poor awareness)   Patient Left in bed;with call bell/phone within reach;with bed alarm set   Nurse Communication Mobility status;Need for lift equipment;Other (comment) (nurse present for sit<>stand trials)  Time: 4174-0814 OT Time Calculation (min): 24 min  Charges: OT General Charges $OT Visit: 1 Visit OT Treatments $Self Care/Home Management : 23-37 mins  Tyrone Schimke, OT Acute Rehabilitation Services Pager: [phone] Office: [phone]   Hortencia Pilar 11/25/2021, 11:12 AM

## 2021-11-25 NOTE — Progress Notes (Addendum)
Physical Therapy Treatment Patient Details Name: Sherry Hampton MRN: [id number] DOB: 04/19/1956 Today's Date: 11/25/2021   History of Present Illness Patient is 65 y.o. 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 Harlow Asa 11/17/21. PMH significant for anemia, OA, GERD, HLD.    PT Comments    SIGNIFICANT MOBILITY DECLINE Last week pt amb in hallway >180 feet.   ???? Narcotics This session pt was Total Assist + 2.  General Comments: AxO x 3 able to recall current location, states she feels "tired" and shared that she use to work at [employer].  Very slow and groggy.  Not at interested in eating.  Max encouragement to increase self movement.  Easily drifts off back to lethargic state.  General bed mobility comments: Pt requitred Toal Assist + 2 to transfer to EOB pt 0% with severe posterior lean and inability to maintain self sitting posture.  Required Max Assist to shift weight anteriorly and also present with poor head stability.General transfer comment: pt required + 2 MaxTotal Assist to rise from elevated bed to stedy.  Poor upper body and head control.  Slumped forward over stedy bar.  Unable to self correct to upright posture.  Used STEDY to transfer pt to recliner.  Pt < 10% self able to offer assist.  Profound weakness and poor motor control. Positioned in recliner with multiple pillows.  Severe RIGHT lean.  Pt profoundly weak.  Unable to hold a cup of water. Rec nursing use Maxi Move Lift to asisst pt back to bed.  Recommendations for follow up therapy are one component of a multi-disciplinary discharge planning process, led by the attending physician.  Recommendations may be updated based on patient status, additional functional criteria and insurance authorization.  Follow Up Recommendations  Skilled nursing-short term rehab (<3 hours/day)     Assistance Recommended at Discharge    Equipment  Recommendations       Recommendations for Other Services       Precautions / Restrictions Precautions Precautions: Fall Precaution Comments: abdominal surgery w/ wound vac, Rt/Lt colostomy. Restrictions Weight Bearing Restrictions: No     Mobility  Bed Mobility Overal bed mobility: Needs Assistance Bed Mobility: Supine to Sit Rolling: Mod assist;+2 for physical assistance Sidelying to sit: +2 for physical assistance;Max assist Supine to sit: Total assist;+2 for physical assistance;+2 for safety/equipment Sit to supine: Max assist;+2 for physical assistance;+2 for safety/equipment   General bed mobility comments: Pt requitred Toal Assist + 2 to transfer to EOB pt 0% with severe posterior lean and inability to maintain self sitting posture.  Required Max Assist to shift weight anteriorly and also present with poor head stability.    Transfers Overall transfer level: Needs assistance Equipment used: Rolling walker (2 wheels) Transfers: Sit to/from Stand Sit to Stand: Max assist;+2 physical assistance;From elevated surface;Total assist           General transfer comment: pt required + 2 MaxTotal Assist to rise from elevated bed to stedy.  Poor upper body and head control.  Slumped forward over stedy bar.  Unable to self correct to upright posture.  Used STEDY to transfer pt to recliner.  Pt < 10% self able to offer assist.  Profound weakness and poor motor control. Transfer via Lift Equipment: Stedy  Ambulation/Gait               General Gait Details: unable to attempt due to poor transfer ability   Stairs  Wheelchair Mobility    Modified Rankin (Stroke Patients Only)       Balance Overall balance assessment: Needs assistance Sitting-balance support: Feet supported;Bilateral upper extremity supported Sitting balance-Leahy Scale: Poor Sitting balance - Comments: BUE and up to mod-max A d/t posterior lean. pt avoiding trunk flexion. Postural  control: Posterior lean Standing balance support: Reliant on assistive device for balance;During functional activity;Bilateral upper extremity supported Standing balance-Leahy Scale: Zero Standing balance comment: rw and up to max +2 assist                            Cognition Arousal/Alertness: Awake/alert Behavior During Therapy: Flat affect;Anxious Overall Cognitive Status: Within Functional Limits for tasks assessed                                 General Comments: AxO x 3 able to recall current location, states she feels "tired" and shared that she use to work at [employer].  Very slow and groggy.  Not at interested in eating.  Max encouragement to increase self movement.  Easily drifts off back to lethargic state.        Exercises      General Comments        Pertinent Vitals/Pain Pain Assessment: Faces Faces Pain Scale: Hurts even more Pain Location: abdomen Pain Descriptors / Indicators: Guarding;Moaning;Grimacing Pain Intervention(s): Monitored during session;Repositioned    Home Living                          Prior Function            PT Goals (current goals can now be found in the care plan section) Progress towards PT goals: Progressing toward goals    Frequency    7X/week      PT Plan Current plan remains appropriate    Co-evaluation              AM-PAC PT "6 Clicks" Mobility   Outcome Measure  Help needed turning from your back to your side while in a flat bed without using bedrails?: Total Help needed moving from lying on your back to sitting on the side of a flat bed without using bedrails?: Total Help needed moving to and from a bed to a chair (including a wheelchair)?: Total Help needed standing up from a chair using your arms (e.g., wheelchair or bedside chair)?: Total Help needed to walk in hospital room?: Total Help needed climbing 3-5 steps with a railing? : Total 6 Click Score: 6     End of Session Equipment Utilized During Treatment: Gait belt Activity Tolerance: Patient limited by fatigue;Treatment limited secondary to medical complications (Comment) Patient left: in chair;with call bell/phone within reach;with chair alarm set Nurse Communication: Mobility status;Need for lift equipment PT Visit Diagnosis: Muscle weakness (generalized) (M62.81);Difficulty in walking, not elsewhere classified (R26.2);Other abnormalities of gait and mobility (R26.89)     Time: 1610-9604 PT Time Calculation (min) (ACUTE ONLY): 33 min  Charges:  $Therapeutic Activity: 23-37 mins                     {Davidson Palmieri  PTA Acute  Rehabilitation Services Pager      [phone] Office      [phone]

## 2021-11-25 NOTE — Progress Notes (Signed)
Pt family called this Probation officer for an update. Pt family wishes to have the doctor and the case manager call him for an update as well. Pts brother states he has questions for the doctor.   Pts brother [name] [phone].

## 2021-11-25 NOTE — Progress Notes (Signed)
Pt was offered food multiple times throughout the day. Pt refused to eat, states "I don't like anything right now." Meal trays delivered in attempt to stimulate pts appetite but pt continued to refuse.

## 2021-11-25 NOTE — Progress Notes (Addendum)
Pharmacy Antibiotic Note  Sherry Hampton is a 65 y.o. female admitted on 11/17/2021 with IAI.  Pharmacy has been consulted for zosyn dosing.  Plan: Zosyn 3.375g IV q8h (4 hour infusion). Pharmacy to sign off  Height: 5\' 10"  (177.8 cm) Weight: 79.8 kg (176 lb) IBW/kg (Calculated) : 68.5  Temp (24hrs), Avg:98.5 F (36.9 C), Min:97.4 F (36.3 C), Max:99.9 F (37.7 C)  Recent Labs  Lab 11/19/21 0432 11/20/21 0440 11/23/21 0527 11/24/21 0446  WBC 14.9* 13.6* 16.2* 15.9*  CREATININE 0.87  --   --  0.63    Estimated Creatinine Clearance: 73.8 mL/min (by C-G formula based on SCr of 0.63 mg/dL).    No Known Allergies  Antimicrobials this admission: Zosyn 11/27>>12/2, resume 12/5>>  Dose adjustments this admission:  Microbiology results: Covid/flu neg  Thank you for allowing pharmacy to be a part of this patient's care.  Eudelia Bunch, Pharm.D 11/25/2021 9:13 AM

## 2021-11-25 NOTE — Consult Note (Signed)
WOC Nurse Consult Note: Seen today with [facility] PA-C M. Macizs  WOC Nurse ostomy follow up Stoma type/location: RLQ ileostomy Stomal assessment/size: 1 and 1/4 inch round, red, raised, edematous, lumen in center Peristomal assessment: intact, clear Treatment options for stomal/peristomal skin: skin barrier ring Output: green/brown liquid effluent Ostomy pouching: 2pc. 2 and 1/4 inch pouching system with skin barrier ring. Education provided: None today. Patient is not able to retain information. Unable to answer orientation questions other than Person. Enrolled patient in [place] Start Discharge program: Yes, previously  [place] Nurse ostomy follow up Stoma type/location: LUQ mucus fistula Stomal assessment/size: 1 and 1/4 inch oval, red, dry, lumen in center Peristomal assessment: intact Treatment options for stomal/peristomal skin: none Output: thick, small amount, brown mucus Ostomy pouching: 2pc. 2 and 1/4 inch pouching system Education provided: None today. See above Enrolled patient in [place] Discharge program: Yes, previously  [place] Nurse wound follow up Wound type: Surgical left to heal by secondary intention, NPWT in place. Patient is premedicated for NPWT dressing change by Bedside RN, who remains at bedside to assist this writer with the dressing and pouch changes. Measurement:15cm x 4cm x 4.2cm Wound bed: Red, dry wound bed, false bottom separates easily. See photos in EHR taken by Mr. Rodolph Bong. Drainage (amount, consistency, odor) serous in tubing of NPWT device Periwound: intact.  Skin protected at umbilicus with 1/4 piece of skin barrier ring Dressing procedure/placement/frequency: Two pieces of black foam are removed from wound, wound cleansed with NS and gently patted dry. Two pieces of black foam are used to obliterate dead space, a piece of skin barrier ring is used to fill in at umbilicus. Drape is applied to wound, dressing attached to 18mmHg continuous  negative pressure and an immediate seal is achieved.  The dressing is labeled.  Next dressing change is due on Wednesday, 11/27/21.  Supplies are in the room.  Ostomy supplies are in the room for the next pouch change.  [facility] nursing team will follow, and will remain available to this patient, the nursing and medical teams.   Thanks, Maudie Flakes, MSN, RN, [place], Arther Abbott  Pager# [phone]

## 2021-11-26 ENCOUNTER — Inpatient Hospital Stay (HOSPITAL_COMMUNITY): Payer: Medicare HMO

## 2021-11-26 ENCOUNTER — Encounter (HOSPITAL_COMMUNITY): Payer: Self-pay

## 2021-11-26 DIAGNOSIS — R198 Other specified symptoms and signs involving the digestive system and abdomen: Secondary | ICD-10-CM

## 2021-11-26 DIAGNOSIS — K219 Gastro-esophageal reflux disease without esophagitis: Secondary | ICD-10-CM | POA: Diagnosis present

## 2021-11-26 DIAGNOSIS — R4182 Altered mental status, unspecified: Secondary | ICD-10-CM | POA: Diagnosis not present

## 2021-11-26 DIAGNOSIS — E871 Hypo-osmolality and hyponatremia: Secondary | ICD-10-CM | POA: Diagnosis present

## 2021-11-26 DIAGNOSIS — G9340 Encephalopathy, unspecified: Secondary | ICD-10-CM | POA: Diagnosis present

## 2021-11-26 DIAGNOSIS — E785 Hyperlipidemia, unspecified: Secondary | ICD-10-CM | POA: Diagnosis present

## 2021-11-26 DIAGNOSIS — E43 Unspecified severe protein-calorie malnutrition: Secondary | ICD-10-CM | POA: Insufficient documentation

## 2021-11-26 DIAGNOSIS — D62 Acute posthemorrhagic anemia: Secondary | ICD-10-CM | POA: Diagnosis present

## 2021-11-26 DIAGNOSIS — Z4682 Encounter for fitting and adjustment of non-vascular catheter: Secondary | ICD-10-CM | POA: Diagnosis not present

## 2021-11-26 DIAGNOSIS — D649 Anemia, unspecified: Secondary | ICD-10-CM | POA: Diagnosis present

## 2021-11-26 DIAGNOSIS — G9341 Metabolic encephalopathy: Secondary | ICD-10-CM | POA: Diagnosis present

## 2021-11-26 LAB — HEPATIC FUNCTION PANEL
ALT: 12 U/L (ref 0–44)
AST: 20 U/L (ref 15–41)
Albumin: 2.3 g/dL — ABNORMAL LOW (ref 3.5–5.0)
Alkaline Phosphatase: 117 U/L (ref 38–126)
Bilirubin, Direct: 0.3 mg/dL — ABNORMAL HIGH (ref 0.0–0.2)
Indirect Bilirubin: 0.5 mg/dL (ref 0.3–0.9)
Total Bilirubin: 0.8 mg/dL (ref 0.3–1.2)
Total Protein: 6.7 g/dL (ref 6.5–8.1)

## 2021-11-26 LAB — GLUCOSE, CAPILLARY
Glucose-Capillary: 118 mg/dL — ABNORMAL HIGH (ref 70–99)
Glucose-Capillary: 135 mg/dL — ABNORMAL HIGH (ref 70–99)

## 2021-11-26 LAB — CBC
HCT: 25.6 % — ABNORMAL LOW (ref 36.0–46.0)
Hemoglobin: 9 g/dL — ABNORMAL LOW (ref 12.0–15.0)
MCH: 30.8 pg (ref 26.0–34.0)
MCHC: 35.2 g/dL (ref 30.0–36.0)
MCV: 87.7 fL (ref 80.0–100.0)
Platelets: 379 10*3/uL (ref 150–400)
RBC: 2.92 MIL/uL — ABNORMAL LOW (ref 3.87–5.11)
RDW: 15.2 % (ref 11.5–15.5)
WBC: 13.3 10*3/uL — ABNORMAL HIGH (ref 4.0–10.5)
nRBC: 0.2 % (ref 0.0–0.2)

## 2021-11-26 LAB — HEMOGLOBIN AND HEMATOCRIT, BLOOD
HCT: 31 % — ABNORMAL LOW (ref 36.0–46.0)
Hemoglobin: 10.6 g/dL — ABNORMAL LOW (ref 12.0–15.0)

## 2021-11-26 LAB — PREALBUMIN: Prealbumin: 6.3 mg/dL — ABNORMAL LOW (ref 18–38)

## 2021-11-26 LAB — MAGNESIUM: Magnesium: 2.1 mg/dL (ref 1.7–2.4)

## 2021-11-26 LAB — PHOSPHORUS: Phosphorus: 2.2 mg/dL — ABNORMAL LOW (ref 2.5–4.6)

## 2021-11-26 LAB — LACTIC ACID, PLASMA: Lactic Acid, Venous: 1.9 mmol/L (ref 0.5–1.9)

## 2021-11-26 LAB — AMMONIA: Ammonia: 22 umol/L (ref 9–35)

## 2021-11-26 MED ORDER — ADULT MULTIVITAMIN W/MINERALS CH
1.0000 | ORAL_TABLET | Freq: Every day | ORAL | Status: DC
  Administered 2021-11-26: 1 via ORAL
  Filled 2021-11-26: qty 1

## 2021-11-26 MED ORDER — LACTATED RINGERS IV BOLUS
1000.0000 mL | Freq: Once | INTRAVENOUS | Status: AC
  Administered 2021-11-26: 1000 mL via INTRAVENOUS

## 2021-11-26 MED ORDER — OSMOLITE 1.2 CAL PO LIQD: 1000.0000 mL | ORAL | Status: DC

## 2021-11-26 MED ORDER — OSMOLITE 1.5 CAL PO LIQD
1000.0000 mL | ORAL | Status: DC
  Administered 2021-11-26: 1000 mL
  Filled 2021-11-26 (×2): qty 1000

## 2021-11-26 MED ORDER — ENSURE ENLIVE PO LIQD
237.0000 mL | Freq: Three times a day (TID) | ORAL | Status: DC
  Administered 2021-11-26 (×2): 237 mL via ORAL

## 2021-11-26 NOTE — Progress Notes (Signed)
Patient placed in safety mitts to prevent her from removing feeding tube.

## 2021-11-26 NOTE — Progress Notes (Addendum)
Initial Nutrition Assessment  DOCUMENTATION CODES:   Severe malnutrition in context of acute illness/injury  INTERVENTION:   **Addendum 1140: Noted new consult for TF initiation and management.  Once small bore tube placed: -Initiate Osmolite 1.5 @ 20 ml/hr x 24 hours.  Monitor magnesium, potassium, and phosphorus BID for at least 3 days, MD to replete as needed, as pt is at risk for refeeding syndrome.   -48 hour Calorie Count  -Ensure Enlive po TID, each supplement provides 350 kcal and 20 grams of protein   -Multivitamin with minerals daily  -Recommend TPN initiation within 24 hours, if Calorie count shows less than 75% of estimated needs  -May need feeding assistance (however would not let RD help her when offered)  NUTRITION DIAGNOSIS:   Severe Malnutrition related to acute illness as evidenced by energy intake < or equal to 50% for > or equal to 5 days, moderate fat depletion, severe muscle depletion.  GOAL:   Patient will meet greater than or equal to 90% of their needs  MONITOR:   PO intake, Supplement acceptance, Labs, Weight trends, I & O's  REASON FOR ASSESSMENT:   Consult Assessment of nutrition requirement/status, Calorie Count, Poor PO  ASSESSMENT:   65 year old female who presents to the emergency department with sudden onset of abdominal pain and distention.CT scan of abdomen and pelvis shows findings consistent with free air with a large pneumoperitoneum.  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  11/27: s/p ex lap, right colectomy, ileostomy  Patient in room, sitting up in bed, breakfast tray in front of her, untouched. Pt sitting to the side and seems to not be able to stay upright. Significant weakness.  Pt has had no appetite and does not seem interested in eating this morning. Encouraged her to eat but she makes no moves to try. I offered to help feed her some bites of eggs but she refused. It was documented  yesterday by nursing that she refused all meals.  Calorie Count was started this morning following surgery consult. Do not suspect pt will meet needs. Noted a plan for possible TPN and recommend this be started soon as pt has had poor PO since surgery on 11/27 (9 days ago) and pt was having N/V/diarrhea for 2 weeks PTA.  Pt agreeable to drinking Ensure, has already been ordered. She did not report drinking any this morning.  Per weight records, last recorded weight from 2018, pt weighed 211 lbs then. Current weight: 176 lbs.  Medications: Metamucil  Labs reviewed:  Low Na  NUTRITION - FOCUSED PHYSICAL EXAM:  Flowsheet Row Most Recent Value  Orbital Region Mild depletion  Upper Arm Region Moderate depletion  Thoracic and Lumbar Region Unable to assess  Buccal Region Severe depletion  Temple Region Severe depletion  Clavicle Bone Region Moderate depletion  Clavicle and Acromion Bone Region Moderate depletion  Scapular Bone Region Severe depletion  Dorsal Hand Severe depletion  Patellar Region Mild depletion  Anterior Thigh Region Mild depletion  Posterior Calf Region Mild depletion  Edema (RD Assessment) None  Hair Reviewed  Eyes Reviewed  Mouth Reviewed  [pale tongue, missing teeth]  Skin Reviewed  Nails Reviewed       Diet Order:   Diet Order             Diet regular Room service appropriate? Yes; Fluid consistency: Thin  Diet effective now  EDUCATION NEEDS:   Education needs have been addressed  Skin:  Skin Assessment: Skin Integrity Issues: Skin Integrity Issues:: Incisions Incisions: 11/27 abdomen  Last BM:  12/6 -type 6&7 -ileostomy  Height:   Ht Readings from Last 1 Encounters:  11/17/21 5\' 10"  (1.778 m)    Weight:   Wt Readings from Last 1 Encounters:  11/17/21 79.8 kg    BMI:  Body mass index is 25.25 kg/m.  Estimated Nutritional Needs:   Kcal:  1950-2150  Protein:  95-105g  Fluid:  2L/day   Clayton Bibles, MS,  RD, LDN Inpatient Clinical Dietitian Contact information available via Amion

## 2021-11-26 NOTE — Consult Note (Signed)
Medical Consultation   Sherry Hampton  XQJ:[id number]  DOB: May 14, 1956  DOA: 11/17/2021  PCP: Merryl Hacker, No   Outpatient Specialists:   Requesting physician: [facility] Surgery team.  Reason for consultation: Altered mental status.   History of Present Illness: Sherry Hampton is an 65 y.o. 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  The patient was surgically intervened with an extended right colectomy and ileostomy done by Dr. Ranae Palms on 11/17/2021.  She has had postoperative generalized weakness and has been followed by physical therapy.  Plans were to discharge the patient this week but her mental state has decreased in the last couple of days.  The surgical team has talked to family who has stated this is not her baseline and comparing that she has never been diagnosed with any dementia or memory issues.  She is currently confused and unable to provide further HPI.  She answers simple questions and denied headache, chest or back pain.  Review of Systems:  ROS As per HPI otherwise 10 point review of systems negative.   Past Medical History: Past Medical History:  Diagnosis Date   Anemia    past hx    Arthritis    wrists , knees,  feet    Cataract    cordical cataracts per pt    Constipation    takes a stool softener  daily- as long as does this has daily soft stools    GERD (gastroesophageal reflux disease)    Hemorrhoids    Hyperlipidemia    Past Surgical History: Past Surgical History:  Procedure Laterality Date   CYSTECTOMY  1972   left buttock with spinal    HEMORRHOID BANDING  02/2015   [facility]    LAPAROSCOPIC SALPINGO OOPHERECTOMY Left 07/1985   right repair    LAPAROTOMY N/A 11/17/2021   Procedure: EXPLORATORY LAPAROTOMY,  EXTENDED RIGHT COLECTOMY, END ILEOSTOMY, MUCOUS FISTULA;  Surgeon: [name], MD;  Location: [facility];  Service: General;  Laterality: N/A;   PARTIAL COLECTOMY N/A 11/17/2021   Procedure: PARTIAL COLECTOMY;  Surgeon: [name], MD;  Location: [facility];  Service: General;  Laterality: N/A;   Allergies:  No Known Allergies  Social History:  reports that she has quit smoking. She has never used smokeless tobacco. She reports that she does not drink alcohol and does not use drugs.  Family History: Family History  Problem Relation Age of Onset   Colon polyps Father    Esophageal cancer Brother    Colon cancer Paternal Grandmother    Rectal cancer Neg Hx    Stomach cancer Neg Hx    Pancreatic cancer Neg Hx    Physical Exam: Vitals:   11/24/21 2152 11/25/21 0542 11/25/21 1418 11/25/21 2050  BP: (!) 113/57 120/62 126/75 119/66  Pulse: 88 76 99 79  Resp: 18 18 18 18   Temp:  98.3 F (36.8 C) 98 F (36.7 C) 98.5 F (36.9 C)  TempSrc:    Oral  SpO2: 96% 96% 94% 93%  Weight:      Height:        Constitutional: Alert and awake, oriented x3, not in any acute distress. Eyes: PERLA, EOMI, irises appear normal, anicteric sclera,  ENMT: external ears and nose appear normal, normal hearing  Lips/oropharynx mucosa look mildly dry, tongue, posterior pharynx appear normal  Neck: neck appears normal, no masses, normal ROM, no thyromegaly, no JVD  CVS: S1-S2 clear, no murmur rubs or gallops, no LE edema, normal pedal pulses  Respiratory: Decreased breath sounds in bases, clear to auscultation bilaterally, no wheezing, rales or rhonchi. Respiratory effort normal. No accessory muscle use.  Abdomen: Mildly distended.  Positive midline surgical scar with wound VAC, ileostomy with fluid in bag and mucous fistula with minimal output in the bag.  Bowel sounds are active.  Minimally tender to palpation. Musculoskeletal: : Moderate generalized weakness.  No cyanosis, clubbing or edema noted  bilaterally Neuro: Cranial nerves II-XII intact, strength, sensation, reflexes Psych: Oriented to name, she knew she was in the hospital due to recent major surgery, but could not tell me the name of the facility, city and state.  She was disoriented to time and date. Skin: no rashes or lesions or ulcers, no induration or nodules on very limited dermatological examination..  Data reviewed:  I have personally reviewed following labs and imaging studies Labs:  CBC: Recent Labs  Lab 11/20/21 0440 11/23/21 0527 11/24/21 0446 11/25/21 0907 11/26/21 0443  WBC 13.6* 16.2* 15.9* 15.4* 13.3*  HGB 9.6* 10.2* 11.6* 10.6* 9.0*  HCT 28.8* 30.4* 34.6* 30.7* 25.6*  MCV 94.7 91.6 92.0 88.2 87.7  PLT 250 329 365 407* 462   Basic Metabolic Panel: Recent Labs  Lab 11/24/21 0446 11/25/21 0907  NA  --  133*  K  --  4.3  CL  --  107  CO2  --  23  GLUCOSE  --  107*  BUN  --  8  CREATININE 0.63 0.44  CALCIUM  --  9.3   GFR Estimated Creatinine Clearance: 73.8 mL/min (by C-G formula based on SCr of 0.44 mg/dL). Liver Function Tests: No results for input(s): AST, ALT, ALKPHOS, BILITOT, PROT, ALBUMIN in the last 168 hours. No results for input(s): LIPASE, AMYLASE in the last 168 hours. No results for input(s): AMMONIA in the last 168 hours. Coagulation profile No results for input(s): INR, PROTIME in the last 168 hours.  Cardiac Enzymes: No results for input(s): CKTOTAL, CKMB, CKMBINDEX, TROPONINI in the last 168 hours. BNP: Invalid input(s): POCBNP CBG: No results for input(s): GLUCAP in the last 168 hours. D-Dimer No results for input(s): DDIMER in the last 72 hours. Hgb A1c No results for input(s): HGBA1C in the last 72 hours. Lipid Profile No results for input(s): CHOL, HDL, LDLCALC, TRIG, CHOLHDL, LDLDIRECT in the last 72 hours. Thyroid function studies No results for input(s): TSH, T4TOTAL, T3FREE, THYROIDAB in the last 72 hours.  Invalid input(s): FREET3 Anemia work up No  results for input(s): VITAMINB12, FOLATE, FERRITIN, TIBC, IRON, RETICCTPCT in the last 72 hours. Urinalysis    Component Value Date/Time   COLORURINE YELLOW 11/23/2021 1307   APPEARANCEUR HAZY (A) 11/23/2021 1307   LABSPEC 1.026 11/23/2021 1307   PHURINE 6.0 11/23/2021 1307   GLUCOSEU NEGATIVE 11/23/2021 1307   HGBUR NEGATIVE 11/23/2021 [place] 11/23/2021 1307   KETONESUR NEGATIVE 11/23/2021 1307   PROTEINUR NEGATIVE 11/23/2021 1307   NITRITE NEGATIVE 11/23/2021 1307   LEUKOCYTESUR NEGATIVE 11/23/2021 1307   Sepsis Labs Invalid input(s): PROCALCITONIN,  WBC,  LACTICIDVEN Microbiology Recent Results (from the past 240 hour(s))  Resp Panel by RT-PCR (Flu A&B, Covid) Nasopharyngeal Swab     Status: None   Collection Time: 11/17/21 12:08 PM   Specimen: Nasopharyngeal Swab; Nasopharyngeal(NP) swabs in vial  transport medium  Result Value Ref Range Status   SARS Coronavirus 2 by RT PCR NEGATIVE NEGATIVE Final    Comment: (NOTE) SARS-CoV-2 target nucleic acids are NOT DETECTED.  The SARS-CoV-2 RNA is generally detectable in upper respiratory specimens during the acute phase of infection. The lowest concentration of SARS-CoV-2 viral copies this assay can detect is 138 copies/mL. A negative result does not preclude SARS-Cov-2 infection and should not be used as the sole basis for treatment or other patient management decisions. A negative result may occur with  improper specimen collection/handling, submission of specimen other than nasopharyngeal swab, presence of viral mutation(s) within the areas targeted by this assay, and inadequate number of viral copies(<138 copies/mL). A negative result must be combined with clinical observations, patient history, and epidemiological information. The expected result is Negative.  Fact Sheet for Patients:  EntrepreneurPulse.com.au  Fact Sheet for Healthcare Providers:   IncredibleEmployment.be  This test is no t yet approved or cleared by the [place] FDA and  has been authorized for detection and/or diagnosis of SARS-CoV-2 by FDA under an Emergency Use Authorization (EUA). This EUA will remain  in effect (meaning this test can be used) for the duration of the COVID-19 declaration under Section 564(b)(1) of the Act, 21 U.S.C.section 360bbb-3(b)(1), unless the authorization is terminated  or revoked sooner.       Influenza A by PCR NEGATIVE NEGATIVE Final   Influenza B by PCR NEGATIVE NEGATIVE Final    Comment: (NOTE) The Xpert Xpress SARS-CoV-2/FLU/RSV plus assay is intended as an aid in the diagnosis of influenza from Nasopharyngeal swab specimens and should not be used as a sole basis for treatment. Nasal washings and aspirates are unacceptable for Xpert Xpress SARS-CoV-2/FLU/RSV testing.  Fact Sheet for Patients: EntrepreneurPulse.com.au  Fact Sheet for Healthcare Providers: IncredibleEmployment.be  This test is not yet approved or cleared by the [place] FDA and has been authorized for detection and/or diagnosis of SARS-CoV-2 by FDA under an Emergency Use Authorization (EUA). This EUA will remain in effect (meaning this test can be used) for the duration of the COVID-19 declaration under Section 564(b)(1) of the Act, 21 U.S.C. section 360bbb-3(b)(1), unless the authorization is terminated or revoked.  Performed at [facility], [place] [address], [place], [place] [address]    Inpatient Medications:   Scheduled Meds:  acetaminophen  1,000 mg Oral Q6H   chlorhexidine  15 mL Mouth Rinse BID   Chlorhexidine Gluconate Cloth  6 each Topical Daily   enoxaparin (LOVENOX) injection  40 mg Subcutaneous Q24H   feeding supplement  237 mL Oral TID BM   mouth rinse  15 mL Mouth Rinse q12n4p   multivitamin with minerals  1 tablet Oral Daily   pantoprazole  40 mg  Oral QHS   psyllium  1 packet Oral BID   Continuous Infusions:  sodium chloride 10 mL/hr at 11/22/21 0434   dextrose 5 % and 0.45 % NaCl with KCl 20 mEq/L 50 mL/hr at 11/25/21 2002   piperacillin-tazobactam (ZOSYN)  IV 3.375 g (11/26/21 0851)   Radiological Exams on Admission: CT HEAD WO CONTRAST (5MM)  Result Date: 11/25/2021 CLINICAL DATA:  Mental status change, weakness EXAM: CT HEAD WITHOUT CONTRAST TECHNIQUE: Contiguous axial images were obtained from the base of the skull through the vertex without intravenous contrast. COMPARISON:  None. FINDINGS: Brain: No evidence of acute infarction, hemorrhage, cerebral edema, mass, mass effect, or midline shift. No hydrocephalus or extra-axial fluid collection. Mildly advanced volume loss for age.  Periventricular white matter changes, likely the sequela of chronic small vessel ischemic disease. Vascular: No hyperdense vessel. Skull: Normal. Negative for fracture or focal lesion. Sinuses/Orbits: No acute finding. Other: The mastoid air cells are well aerated. IMPRESSION: IMPRESSION No acute intracranial process. Electronically Signed   By: Merilyn Baba M.D.   On: 11/25/2021 18:36   CT ABDOMEN PELVIS W CONTRAST  Result Date: 11/24/2021 CLINICAL DATA:  Intra-abdominal abscess. Status post perforated colon. EXAM: CT ABDOMEN AND PELVIS WITH CONTRAST TECHNIQUE: Multidetector CT imaging of the abdomen and pelvis was performed using the standard protocol following bolus administration of intravenous contrast. CONTRAST:  42mL OMNIPAQUE IOHEXOL 350 MG/ML SOLN COMPARISON:  CT abdomen pelvis 11/17/2021 FINDINGS: Lower chest: Interval development of a trace right pleural effusion. Associated right lower lobe passive atelectasis. Coronary artery calcification. Hepatobiliary: No focal liver abnormality. Query gallbladder sludge. No gallstones, gallbladder wall thickening, or pericholecystic fluid. No biliary dilatation. Stable prominent but not enlarged common bile duct  caliber. Pancreas: No focal lesion. Normal pancreatic contour. No surrounding inflammatory changes. No main pancreatic ductal dilatation. Spleen: Normal in size without focal abnormality.  Splenule noted. Adrenals/Urinary Tract: No adrenal nodule bilaterally. Bilateral kidneys enhance symmetrically. Subcentimeter hypodensities too small to characterize. Similar-appearing pericentimeter fluid density lesion within left kidney likely represents a simple renal cyst. Redemonstration of bilateral 1-2 mm nephrolithiasis. No hydronephrosis. No hydroureter. The urinary bladder is unremarkable. Stomach/Bowel: PO contrast reaches the distal ileum. Status post partial colectomy with right lower quadrant end ileostomy formation as well as left lower quadrant mucous fistula formation of the distal transverse colon. Stomach is within normal limits. Persistent distal descending/proximal sigmoid bowel luminal collapse in the setting of diffuse diverticulosis with limited evaluation. No definite intramural abscess formation. Appendix appears normal. Vascular/Lymphatic: No abdominal aorta or iliac aneurysm. Mild atherosclerotic plaque of the aorta and its branches. No abdominal, pelvic, or inguinal lymphadenopathy. Reproductive: Coarsely calcified anterior uterine wall lesion measuring up to at least 4 cm consistent with a degenerative uterine fibroid. Adnexa are unremarkable. Other: Interval development of trace volume peripancreatic and right paracolic gutter simple free fluid that extends inferiorly into the right pelvis. There is mild peritoneal wall enhancement/thickening suggestive of superimposed infection. No definite organized fluid collection. Trace residual foci of pneumoperitoneum that is likely postsurgical. Musculoskeletal: No abdominal wall hernia or abnormality. No suspicious lytic or blastic osseous lesions. No acute displaced fracture. Multilevel degenerative changes of the spine. IMPRESSION: 1. Interval development  of trace volume peripancreatic and right paracolic gutter simple free fluid that extends inferiorly into the right pelvis. Associated mild peritoneal wall enhancement/thickening suggestive of superimposed infection. Slowly developing abscess is not excluded; however, no definite drainable organized fluid collection/abscess formation noted. 2. Persistent distal descending/proximal sigmoid bowel luminal collapse in the setting of diffuse diverticulosis with limited evaluation. Recommend colonoscopy status post treatment and status post complete resolution of inflammatory changes to exclude an underlying lesion. 3. Status post partial colectomy with right lower quadrant end ileostomy formation as well as left lower quadrant mucous fistula formation of the distal transverse colon. 4. Other imaging findings of potential clinical significance: Query gallbladder sludge formation. Nonobstructive 1-2 mm nephrolithiasis. Degenerative intramural uterine fibroid. Aortic Atherosclerosis (ICD10-I70.0). Electronically Signed   By: Iven Finn M.D.   On: 11/24/2021 20:41    Impression/Recommendations Principal Problem:   Acute encephalopathy Frequent neurochecks. Hold methocarbamol.  Active Problems:   Hyponatremia In the setting of poor nutrition. Monitor sodium level.    Acute blood loss anemia Hemoglobin down from 11.6 to  9.0 g/dL past 2 days.    Perforated abdominal viscus Continue postop care per surgery.    GERD (gastroesophageal reflux disease) Continue pantoprazole 40 mg p.o. bedtime.    Hyperlipidemia Rosuvastatin have been held.   Thank you for this consultation.  Our [facility] hospitalist team will follow the patient with you.  Time Spent: About 55 minutes were spent during the process of this consultation.Reubin Milan M.D. [facility] 11/26/2021, 11:17 AM  This document was prepared using Dragon voice recognition software and may contain some unintended transcription  errors.

## 2021-11-26 NOTE — Progress Notes (Signed)
RN updated patients sister about patient receiving tube feeds. Sister was grateful for the update on patients status.

## 2021-11-26 NOTE — Progress Notes (Signed)
9 Days Post-Op  Subjective: CC: Patient noted to have mobility decline by PT/OT yesterday compared to prior week.   I spoke with the patient's RN yesterday who had her over the weekend and reports that she had no significant change from the weekend. I tried to reach out to PT/OT several times to discuss and see their specific complaints without success. CTH was done and negative.    Patient is A&O x3 this morning.  She reports no abdominal pain, n/v.  She continues to have ostomy output.  Patient with pure wick and voiding.  She does not remember if she ate anything yesterday.  Per RNs note it appears she was offered food multiple times throughout the day but did not eat.  Objective: Vital signs in last 24 hours: Temp:  [98 F (36.7 C)-98.5 F (36.9 C)] 98.5 F (36.9 C) (12/05 2050) Pulse Rate:  [79-99] 79 (12/05 2050) Resp:  [18] 18 (12/05 2050) BP: (119-126)/(66-75) 119/66 (12/05 2050) SpO2:  [93 %-94 %] 93 % (12/05 2050) Last BM Date: 11/23/21  Intake/Output from previous day: 12/05 0701 - 12/06 0700 In: 3681.7 [P.O.:820; I.V.:2726; IV Piggyback:135.7] Out: 1550 [Urine:1300; Stool:250] Intake/Output this shift: No intake/output data recorded.  Physical Exam: Gen:  Alert, NAD, pleasant Card:  Reg Pulm:  CTAB, no W/R/R, effort normal Abd: Soft, mild distension, she reports no tenderness upon palpation of her abdomen. No rigidity or guarding. +BS. Ileostomy with liquid stool and air in bag. Stoma pink and viable. Mucus fistula present with no output in bag. Midline wound with wound vac present. Output serous.  Ext:  No LE edema Psych: A&Ox3  Skin: no rashes noted, warm and dry Neuro: Patient is A&O x 3. Speech clear. She for the most part follows commands but occasionally acknowledges commands and does not follow them (such as asking to perform finger to nose test etc). No facial droop. PERRLA. EOMI. CN III-XII grossly intact. Patient moves all extremities 4 spontaneously.  Strength was difficult to assess 2/2 participation but she has able and equal light grip squeeze bilaterally. She can hold both arms and both legs up against gravity. She has able and equal plantar flexion strength bilaterally.   Lab Results:  Recent Labs    11/25/21 0907 11/26/21 0443  WBC 15.4* 13.3*  HGB 10.6* 9.0*  HCT 30.7* 25.6*  PLT 407* 379   BMET Recent Labs    11/24/21 0446 11/25/21 0907  NA  --  133*  K  --  4.3  CL  --  107  CO2  --  23  GLUCOSE  --  107*  BUN  --  8  CREATININE 0.63 0.44  CALCIUM  --  9.3   PT/INR No results for input(s): LABPROT, INR in the last 72 hours. CMP     Component Value Date/Time   NA 133 (L) 11/25/2021 0907   K 4.3 11/25/2021 0907   CL 107 11/25/2021 0907   CO2 23 11/25/2021 0907   GLUCOSE 107 (H) 11/25/2021 0907   BUN 8 11/25/2021 0907   CREATININE 0.44 11/25/2021 0907   CALCIUM 9.3 11/25/2021 0907   PROT 7.7 11/17/2021 0955   ALBUMIN 3.9 11/17/2021 0955   AST 28 11/17/2021 0955   ALT 14 11/17/2021 0955   ALKPHOS 68 11/17/2021 0955   BILITOT 1.7 (H) 11/17/2021 0955   GFRNONAA >60 11/25/2021 0907   Lipase     Component Value Date/Time   LIPASE 22 11/17/2021 0955    Studies/Results:  CT HEAD WO CONTRAST (5MM)  Result Date: 11/25/2021 CLINICAL DATA:  Mental status change, weakness EXAM: CT HEAD WITHOUT CONTRAST TECHNIQUE: Contiguous axial images were obtained from the base of the skull through the vertex without intravenous contrast. COMPARISON:  None. FINDINGS: Brain: No evidence of acute infarction, hemorrhage, cerebral edema, mass, mass effect, or midline shift. No hydrocephalus or extra-axial fluid collection. Mildly advanced volume loss for age. Periventricular white matter changes, likely the sequela of chronic small vessel ischemic disease. Vascular: No hyperdense vessel. Skull: Normal. Negative for fracture or focal lesion. Sinuses/Orbits: No acute finding. Other: The mastoid air cells are well aerated. IMPRESSION:  IMPRESSION No acute intracranial process. Electronically Signed   By: Merilyn Baba M.D.   On: 11/25/2021 18:36   CT ABDOMEN PELVIS W CONTRAST  Result Date: 11/24/2021 CLINICAL DATA:  Intra-abdominal abscess. Status post perforated colon. EXAM: CT ABDOMEN AND PELVIS WITH CONTRAST TECHNIQUE: Multidetector CT imaging of the abdomen and pelvis was performed using the standard protocol following bolus administration of intravenous contrast. CONTRAST:  51mL OMNIPAQUE IOHEXOL 350 MG/ML SOLN COMPARISON:  CT abdomen pelvis 11/17/2021 FINDINGS: Lower chest: Interval development of a trace right pleural effusion. Associated right lower lobe passive atelectasis. Coronary artery calcification. Hepatobiliary: No focal liver abnormality. Query gallbladder sludge. No gallstones, gallbladder wall thickening, or pericholecystic fluid. No biliary dilatation. Stable prominent but not enlarged common bile duct caliber. Pancreas: No focal lesion. Normal pancreatic contour. No surrounding inflammatory changes. No main pancreatic ductal dilatation. Spleen: Normal in size without focal abnormality.  Splenule noted. Adrenals/Urinary Tract: No adrenal nodule bilaterally. Bilateral kidneys enhance symmetrically. Subcentimeter hypodensities too small to characterize. Similar-appearing pericentimeter fluid density lesion within left kidney likely represents a simple renal cyst. Redemonstration of bilateral 1-2 mm nephrolithiasis. No hydronephrosis. No hydroureter. The urinary bladder is unremarkable. Stomach/Bowel: PO contrast reaches the distal ileum. Status post partial colectomy with right lower quadrant end ileostomy formation as well as left lower quadrant mucous fistula formation of the distal transverse colon. Stomach is within normal limits. Persistent distal descending/proximal sigmoid bowel luminal collapse in the setting of diffuse diverticulosis with limited evaluation. No definite intramural abscess formation. Appendix appears  normal. Vascular/Lymphatic: No abdominal aorta or iliac aneurysm. Mild atherosclerotic plaque of the aorta and its branches. No abdominal, pelvic, or inguinal lymphadenopathy. Reproductive: Coarsely calcified anterior uterine wall lesion measuring up to at least 4 cm consistent with a degenerative uterine fibroid. Adnexa are unremarkable. Other: Interval development of trace volume peripancreatic and right paracolic gutter simple free fluid that extends inferiorly into the right pelvis. There is mild peritoneal wall enhancement/thickening suggestive of superimposed infection. No definite organized fluid collection. Trace residual foci of pneumoperitoneum that is likely postsurgical. Musculoskeletal: No abdominal wall hernia or abnormality. No suspicious lytic or blastic osseous lesions. No acute displaced fracture. Multilevel degenerative changes of the spine. IMPRESSION: 1. Interval development of trace volume peripancreatic and right paracolic gutter simple free fluid that extends inferiorly into the right pelvis. Associated mild peritoneal wall enhancement/thickening suggestive of superimposed infection. Slowly developing abscess is not excluded; however, no definite drainable organized fluid collection/abscess formation noted. 2. Persistent distal descending/proximal sigmoid bowel luminal collapse in the setting of diffuse diverticulosis with limited evaluation. Recommend colonoscopy status post treatment and status post complete resolution of inflammatory changes to exclude an underlying lesion. 3. Status post partial colectomy with right lower quadrant end ileostomy formation as well as left lower quadrant mucous fistula formation of the distal transverse colon. 4. Other imaging findings of potential clinical significance:  Query gallbladder sludge formation. Nonobstructive 1-2 mm nephrolithiasis. Degenerative intramural uterine fibroid. Aortic Atherosclerosis (ICD10-I70.0). Electronically Signed   By: Iven Finn M.D.   On: 11/24/2021 20:41    Anti-infectives: Anti-infectives (From admission, onward)    Start     Dose/Rate Route Frequency Ordered Stop   11/25/21 1000  piperacillin-tazobactam (ZOSYN) IVPB 3.375 g        3.375 g 12.5 mL/hr over 240 Minutes Intravenous Every 8 hours 11/25/21 0909     11/17/21 1830  piperacillin-tazobactam (ZOSYN) IVPB 3.375 g        3.375 g 12.5 mL/hr over 240 Minutes Intravenous Every 8 hours 11/17/21 1415 11/22/21 2359   11/17/21 1230  piperacillin-tazobactam (ZOSYN) IVPB 3.375 g        3.375 g 100 mL/hr over 30 Minutes Intravenous  Once 11/17/21 1220 11/17/21 1304        Assessment/Plan POD 9, 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 Harlow Asa 11/27 - CT 12/4 w/ trace peripancreatic and right paracolic gutter simple free fluid that extends inferiorly into the right pelvis with associated peritoneal wall enhancement/thickening. No organized drainable fluid collection.  - Restarted abx 12/5. WBC downtrending (15.4 > 13.3). Afebrile - On regular diet + ensure. Minimal oral intake. Nutrition consult for calorie count ordered yesterday. After discussion with MD will place cortrak + start TF's - Ileostomy output improved. Off Imodium. Cont BID metamucil - WOC following for ileostomy, mucous fistula, wound VAC (MWF change) - Mobilize. PT/OT recommend SNF, TOC consult. - Pathology benign. CT distal descending/proximal sigmoid bowel luminal collapse -- radiology recommending colonoscopy as outpatient.    FEN - Regular diet, ensure, IVFs@50cc , monitor ileostomy output. Cortrak + TF's VTE - SCDs, Lovenox ID - zosyn 11/27 - 12/2. 12/5 >>  Foley - External.    Encephalopathy/FTT - I called and spoke with the patient's family, her brother [name].  He reports prior to admission patient was living at home alone, ambulating without assistive devices, driving and doing ADLs.  He  reports she is live down here for 7+ years and has lived independently.  Her closest family member is her sister in [place].  He reports he talks to her 1-2 times per week and does not recall her having any issues with memory that of been documented in the chart here.  The patient has had decreased mobility with PT/OT and appears to have generalized weakness without any focal weakness however her exam is somewhat limited 2/2 participation.  Her CTH was negative for any acute abnormalities. Unsure if there is some underlying dementia, hospital delirium, medication related, metabolic process, other medical process etc.  Recommend limiting narcotics. I have asked [facility] to consult for assistance in further work-up.  Greatly appreciate their assistance.   LOS: 9 days    Jillyn Ledger , [facility] 11/26/2021, 9:26 AM Please see Amion for pager number during day hours 7:00am-4:30pm

## 2021-11-26 NOTE — TOC Progression Note (Signed)
Transition of Care [facility]) - Progression Note    Patient Details  Name: Labrittany Wechter MRN: [id number] Date of Birth: 05/13/1956  Transition of [facility]) CM/SW Contact  Lennart Pall, LCSW Phone Number: 11/26/2021, 11:56 AM  Clinical Narrative:    Met with pt yesterday and today to update on SNF bed - we did receive insurance authorization but pt now not medically cleared for SNF dc.  [place] [facility] will resubmit for insurance auth when cleared.  Have updated pt and her brother, [name].   Expected Discharge Plan: [place] Barriers to Discharge: Continued Medical Work up, Ship broker, SNF Pending bed offer  Expected Discharge Plan and Services Expected Discharge Plan: [place] In-house Referral: Clinical Social Work     Living arrangements for the past 2 months: Single Family Home                                       Social Determinants of Health (SDOH) Interventions    Readmission Risk Interventions No flowsheet data found.

## 2021-11-27 ENCOUNTER — Inpatient Hospital Stay (HOSPITAL_COMMUNITY): Payer: Medicare HMO

## 2021-11-27 DIAGNOSIS — E43 Unspecified severe protein-calorie malnutrition: Secondary | ICD-10-CM | POA: Diagnosis not present

## 2021-11-27 DIAGNOSIS — G9340 Encephalopathy, unspecified: Secondary | ICD-10-CM

## 2021-11-27 DIAGNOSIS — E871 Hypo-osmolality and hyponatremia: Secondary | ICD-10-CM

## 2021-11-27 DIAGNOSIS — R198 Other specified symptoms and signs involving the digestive system and abdomen: Secondary | ICD-10-CM | POA: Diagnosis not present

## 2021-11-27 DIAGNOSIS — K219 Gastro-esophageal reflux disease without esophagitis: Secondary | ICD-10-CM | POA: Diagnosis not present

## 2021-11-27 DIAGNOSIS — E785 Hyperlipidemia, unspecified: Secondary | ICD-10-CM | POA: Diagnosis not present

## 2021-11-27 DIAGNOSIS — R4182 Altered mental status, unspecified: Secondary | ICD-10-CM | POA: Diagnosis not present

## 2021-11-27 LAB — RENAL FUNCTION PANEL
Albumin: 2 g/dL — ABNORMAL LOW (ref 3.5–5.0)
Anion gap: 8 (ref 5–15)
BUN: 7 mg/dL — ABNORMAL LOW (ref 8–23)
CO2: 22 mmol/L (ref 22–32)
Calcium: 9.2 mg/dL (ref 8.9–10.3)
Chloride: 105 mmol/L (ref 98–111)
Creatinine, Ser: 0.41 mg/dL — ABNORMAL LOW (ref 0.44–1.00)
GFR, Estimated: >60 mL/min (ref >60)
Glucose, Bld: 118 mg/dL — ABNORMAL HIGH (ref 70–99)
Phosphorus: 2.1 mg/dL — ABNORMAL LOW (ref 2.5–4.6)
Potassium: 3.7 mmol/L (ref 3.5–5.1)
Sodium: 135 mmol/L (ref 135–145)

## 2021-11-27 LAB — IRON AND TIBC
Iron: 21 ug/dL — ABNORMAL LOW (ref 28–170)
Saturation Ratios: 12 % (ref 10.4–31.8)
TIBC: 172 ug/dL — ABNORMAL LOW (ref 250–450)
UIBC: 151 ug/dL

## 2021-11-27 LAB — CBC
HCT: 27.2 % — ABNORMAL LOW (ref 36.0–46.0)
Hemoglobin: 9.3 g/dL — ABNORMAL LOW (ref 12.0–15.0)
MCH: 31 pg (ref 26.0–34.0)
MCHC: 34.2 g/dL (ref 30.0–36.0)
MCV: 90.7 fL (ref 80.0–100.0)
Platelets: 502 10*3/uL — ABNORMAL HIGH (ref 150–400)
RBC: 3 MIL/uL — ABNORMAL LOW (ref 3.87–5.11)
RDW: 15.3 % (ref 11.5–15.5)
WBC: 10.2 10*3/uL (ref 4.0–10.5)
nRBC: 0 % (ref 0.0–0.2)

## 2021-11-27 LAB — URINALYSIS, ROUTINE W REFLEX MICROSCOPIC
Bilirubin Urine: NEGATIVE
Glucose, UA: NEGATIVE mg/dL
Hgb urine dipstick: NEGATIVE
Ketones, ur: NEGATIVE mg/dL
Leukocytes,Ua: NEGATIVE
Nitrite: NEGATIVE
Protein, ur: NEGATIVE mg/dL
Specific Gravity, Urine: 1.016 (ref 1.005–1.030)
pH: 6 (ref 5.0–8.0)

## 2021-11-27 LAB — GLUCOSE, CAPILLARY
Glucose-Capillary: 114 mg/dL — ABNORMAL HIGH (ref 70–99)
Glucose-Capillary: 119 mg/dL — ABNORMAL HIGH (ref 70–99)
Glucose-Capillary: 121 mg/dL — ABNORMAL HIGH (ref 70–99)
Glucose-Capillary: 124 mg/dL — ABNORMAL HIGH (ref 70–99)
Glucose-Capillary: 127 mg/dL — ABNORMAL HIGH (ref 70–99)
Glucose-Capillary: 129 mg/dL — ABNORMAL HIGH (ref 70–99)

## 2021-11-27 LAB — AMMONIA: Ammonia: 16 umol/L (ref 9–35)

## 2021-11-27 LAB — FOLATE: Folate: 4.7 ng/mL — ABNORMAL LOW (ref >5.9)

## 2021-11-27 LAB — MAGNESIUM: Magnesium: 2 mg/dL (ref 1.7–2.4)

## 2021-11-27 LAB — FERRITIN: Ferritin: 167 ng/mL (ref 11–307)

## 2021-11-27 LAB — TSH: TSH: 1.807 u[IU]/mL (ref 0.350–4.500)

## 2021-11-27 LAB — VITAMIN B12: Vitamin B-12: 1255 pg/mL — ABNORMAL HIGH (ref 180–914)

## 2021-11-27 MED ORDER — FOLIC ACID 1 MG PO TABS: 1.0000 mg | ORAL_TABLET | Freq: Every day | ORAL | Status: DC

## 2021-11-27 MED ORDER — SODIUM CHLORIDE 0.9 % IV SOLN: INTRAVENOUS | Status: DC

## 2021-11-27 MED ORDER — JUVEN PO PACK
1.0000 | PACK | Freq: Two times a day (BID) | ORAL | Status: DC
  Administered 2021-11-28 – 2021-12-02 (×7): 1
  Filled 2021-11-27 (×12): qty 1

## 2021-11-27 MED ORDER — ADULT MULTIVITAMIN W/MINERALS CH
1.0000 | ORAL_TABLET | Freq: Every day | ORAL | Status: DC
  Administered 2021-11-28 – 2021-12-02 (×5): 1
  Filled 2021-11-27 (×5): qty 1

## 2021-11-27 MED ORDER — SODIUM CHLORIDE 0.9 % IV SOLN: 1.0000 mg | Freq: Every day | INTRAVENOUS | Status: DC

## 2021-11-27 MED ORDER — POTASSIUM PHOSPHATES 15 MMOLE/5ML IV SOLN
15.0000 mmol | Freq: Once | INTRAVENOUS | Status: AC
  Administered 2021-11-27: 15 mmol via INTRAVENOUS
  Filled 2021-11-27: qty 5

## 2021-11-27 MED ORDER — OXYCODONE HCL 5 MG PO TABS
2.5000 mg | ORAL_TABLET | ORAL | Status: DC | PRN
Start: 2021-11-27 — End: 2021-12-01
  Administered 2021-11-28 – 2021-12-01 (×6): 5 mg via ORAL
  Filled 2021-11-27 (×6): qty 1

## 2021-11-27 MED ORDER — FOLIC ACID 5 MG/ML IJ SOLN
1.0000 mg | Freq: Every day | INTRAMUSCULAR | Status: DC
  Administered 2021-11-27 – 2021-12-03 (×7): 1 mg via INTRAVENOUS
  Filled 2021-11-27 (×7): qty 0.2

## 2021-11-27 MED ORDER — OSMOLITE 1.5 CAL PO LIQD
1000.0000 mL | ORAL | Status: DC
  Administered 2021-11-27: 1000 mL
  Filled 2021-11-27 (×2): qty 1000

## 2021-11-27 NOTE — Care Management Important Message (Signed)
Important Message  Patient Details IM Letter placed in Patients room. Name: Sherry Hampton MRN: [id number] Date of Birth: 12-13-1956   Medicare Important Message Given:  Yes     Kerin Salen 11/27/2021, 12:26 PM

## 2021-11-27 NOTE — Progress Notes (Signed)
Occupational Therapy Treatment Patient Details Name: Sherry Hampton MRN: [id number] DOB: 10/19/1956 Today's Date: 11/27/2021   History of present illness Patient is 65 y.o. 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 Harlow Asa 11/17/21. PMH significant for anemia, OA, GERD, HLD.   OT comments  Treatment focused on activity tolerance and functional mobility. Patient continues to exhibit impaired cognition. She is alert to self only. She answers almost all questions and statements with "yes" and followed only one commands during treatment and that was to squeeze therapist hand on left. She is total assist with +2 physical assistance for bed transfers and max assist x 2 to come in to a partial stand. She had poor sitting balance with a heavy posterior lean and a loss of balance to the right when her RUE was placed on her knee. Attempt to assess upper extremities - as patient exhibited decreased use of RUE- was unsuccessful as she would not follow commands. With standing - therapist and tech had to place patient's hands on walker. Will continue POC.   Recommendations for follow up therapy are one component of a multi-disciplinary discharge planning process, led by the attending physician.  Recommendations may be updated based on patient status, additional functional criteria and insurance authorization.    Follow Up Recommendations  Skilled nursing-short term rehab (<3 hours/day)    Assistance Recommended at Discharge Frequent or constant Supervision/Assistance  Equipment Recommendations   (TBD)    Recommendations for Other Services      Precautions / Restrictions Precautions Precautions: Fall Precaution Comments: abdominal surgery w/ wound vac, Rt/Lt colostomy, tube feed Restrictions Weight Bearing Restrictions: No       Mobility Bed Mobility Overal bed mobility: Needs Assistance Bed Mobility: Supine to  Sit;Sit to Supine Rolling: Total assist;+2 for physical assistance;+2 for safety/equipment Sidelying to sit: Total assist;+2 for physical assistance;+2 for safety/equipment   Sit to supine: Total assist;+2 for physical assistance;+2 for safety/equipment   General bed mobility comments: Patient require total assist for bed transfers today with patient not providing any significant assistance. Patient exhibiting significant posterior lean during transition.    Transfers Overall transfer level: Needs assistance Equipment used: Rolling walker (2 wheels) Transfers: Sit to/from Stand Sit to Stand: Max assist;+2 physical assistance;+2 safety/equipment;From elevated surface           General transfer comment: Max x 2 physical assistance to come into partial stand. Patient unable to follow commands. Required assistance to put hands on walker.     Balance Overall balance assessment: Needs assistance Sitting-balance support: Feet supported;Bilateral upper extremity supported Sitting balance-Leahy Scale: Poor Sitting balance - Comments: reliant on external assistant. Posterior lean and a tendency to fall to the right when RUE removed. Postural control: Posterior lean   Standing balance-Leahy Scale: Zero Standing balance comment: rw and up to max +2 assist                           ADL either performed or assessed with clinical judgement   ADL                                              Extremity/Trunk Assessment Upper Extremity Assessment Upper Extremity Assessment: Difficult to assess due to impaired cognition (attempt at assessing UEs limited by patient's  inability to follow commands. Decrease use in right hand compared to left - though minimal on both sides. States "yes" when asked is she is left handed.)            Vision   Vision Assessment?: No apparent visual deficits   Perception     Praxis      Cognition Arousal/Alertness:  Awake/alert Behavior During Therapy: Flat affect Overall Cognitive Status: Impaired/Different from baseline Area of Impairment: Orientation;Attention;Memory;Following commands;Safety/judgement;Awareness;Problem solving                 Orientation Level: Disoriented to;Place;Time;Situation Current Attention Level: Focused Memory: Decreased recall of precautions;Decreased short-term memory Following Commands: Follows one step commands inconsistently (followed one command during treatment) Safety/Judgement: Decreased awareness of safety;Decreased awareness of deficits Awareness: Intellectual Problem Solving: Decreased initiation;Slow processing;Difficulty sequencing;Requires verbal cues;Requires tactile cues General Comments: Alert to self but is unable to tell me where she is, the month or holiday coming out. Verbalizations minimal - predominantly just the word "yes"          Exercises     Shoulder Instructions       General Comments      Pertinent Vitals/ Pain       Pain Assessment: Faces Faces Pain Scale: Hurts even more Pain Location: abdomen Pain Descriptors / Indicators: Guarding;Moaning;Grimacing Pain Intervention(s): Limited activity within patient's tolerance;Monitored during session;Premedicated before session  Home Living                                          Prior Functioning/Environment              Frequency  Min 2X/week        Progress Toward Goals  OT Goals(current goals can now be found in the care plan section)  Progress towards OT goals: Not progressing toward goals - comment (poor cognition)  Acute Rehab OT Goals OT Goal Formulation: With patient Time For Goal Achievement: 12/03/21 Potential to Achieve Goals: [place] Discharge plan remains appropriate    Co-evaluation          OT goals addressed during session:  (functional mobility)      AM-PAC OT "6 Clicks" Daily Activity     Outcome Measure    Help from another person eating meals?: Total Help from another person taking care of personal grooming?: Total Help from another person toileting, which includes using toliet, bedpan, or urinal?: Total Help from another person bathing (including washing, rinsing, drying)?: Total Help from another person to put on and taking off regular upper body clothing?: Total Help from another person to put on and taking off regular lower body clothing?: Total 6 Click Score: 6    End of Session Equipment Utilized During Treatment: Rolling walker (2 wheels);Gait belt  OT Visit Diagnosis: Unsteadiness on feet (R26.81);Other symptoms and signs involving cognitive function;Pain;History of falling (Z91.81);Repeated falls (R29.6);Muscle weakness (generalized) (M62.81)   Activity Tolerance Patient limited by pain (poor cognition)   Patient Left     Nurse Communication Mobility status;Need for lift equipment        Time: 1130-1155 OT Time Calculation (min): 25 min  Charges: OT General Charges $OT Visit: 1 Visit OT Treatments $Therapeutic Activity: 23-37 mins  Meriam Chojnowski, OTR/L [place]  Office [phone] Pager: [place] 11/27/2021, 2:33 PM

## 2021-11-27 NOTE — Progress Notes (Signed)
PROGRESS NOTE/consult    Laraine Samet  ZOX:[id number] DOB: [birth year] DOA: 11/17/2021 PCP: Pcp, No    No chief complaint on file.   Brief Narrative:  Trany Chernick is an 65 y.o. 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  The patient was surgically intervened with an extended right colectomy and ileostomy done by Dr. Ranae Palms on 11/17/2021.  She has had postoperative generalized weakness and has been followed by physical therapy.  Plans were to discharge the patient this week but her mental state has decreased in the last couple of days.  The surgical team has talked to family who has stated this is not her baseline and comparing that she has never been diagnosed with any dementia or memory issues.  She is currently confused and unable to provide further HPI.  She answers simple questions and denied headache, chest or back pain.     Assessment & Plan:   Principal Problem:   Perforated abdominal viscus Active Problems:   GERD (gastroesophageal reflux disease)   Hyperlipidemia   Hyponatremia   Acute blood loss anemia   Acute encephalopathy   Protein-calorie malnutrition, severe  #1 acute metabolic encephalopathy -Hospitalist were consulted due to concern for confusion postoperatively. -Patient somewhat confused on exam today however answers simple questions.  Patient refusing to answer orientation questions. -Patient with no focal neurological deficits. -Ammonia level ordered this morning at 16, within normal limits. -Anemia panel with a folate deficiency with folic level of 4.7.  Vitamin B12 levels elevated at 1255. -TSH within normal limits at 1.807.  HIV nonreactive. -Urinalysis nitrite negative, leukocytes negative. -Chest x-ray -Place on folic acid 1 mg IV  daily until tolerating oral intake and could transition to oral folic acid -With linear densities in the medial right mid and right lower lung fields with interval worsening suggesting worsening of atelectasis/pneumonitis. -Patient afebrile, on empiric IV Zosyn highly doubt if pneumonia. -Minimize narcotics.  We will discontinue IV Dilaudid, change oxycodone to 2.5-5 every 6 as needed. -Pulmonary toilet.  2.  Hyponatremia -Check a UA, urine electrolytes. -IV fluids. -Repeat labs in the a.m.  3.  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 Toy Care 11/27 -Per primary team.  4.  Hypophosphatemia -Agree with repletion per primary team.  5.  Severe protein calorie malnutrition -Continue tube feeds.  6.  GERD -PPI.  7.  Hyperlipidemia -Continue statin.   DVT prophylaxis: SCDs Code Status: Full Family Communication: Updated patient.  No family at bedside. Disposition:   Status is: Inpatient  Remains inpatient appropriate because: Severity of illness       Consultants:  Chart hospitalist: Dr. Olevia Bowens 11/26/2021 Expiratory laparotomy, extended right colectomy, end ileostomy, mucous fistula per Dr.Gerkin 11/17/2021  Procedures:  CT abdomen and pelvis 11/17/2021, 11/24/2021 CT head 11/25/2021 Chest x-ray 11/26/2021, 11/27/2021 Abdominal films 11/26/2021  Antimicrobials:  IV Zosyn 11/17/2021>>>> 11/22/2021 IV Zosyn 11/25/2021>>>>   Subjective: Patient laying in bed.  Feeding tube in place.  Denies any chest pain.  No shortness of breath.  Some complaints of upper abdominal discomfort.  Seems frustrated when being asked orientation questions, and refusing to answer those questions.  Following simple commands.  Objective: Vitals:   11/26/21 1312 11/26/21 2011 11/27/21 0500 11/27/21 0555  BP: (!) 123/56 (!) 144/74  125/62  Pulse: 84 89  92  Resp: 18 18  18    Temp: 98.4 F (36.9 C) 99.2 F (37.3 C)  98.3 F (36.8 C)  TempSrc:  Oral  Oral  SpO2: 96% 96%  95%  Weight:   84.1 kg   Height:        Intake/Output Summary (Last 24 hours) at 11/27/2021 1128 Last data filed at 11/27/2021 1000 Gross per 24 hour  Intake 1832.03 ml  Output 3110 ml  Net -1277.97 ml   Filed Weights   11/17/21 1928 11/27/21 0500  Weight: 79.8 kg 84.1 kg    Examination:  General exam: Appears calm and comfortable.  Feeding tube in place. Respiratory system: Clear to auscultation anterior lung fields. Respiratory effort normal. Cardiovascular system: S1 & S2 heard, RRR. No JVD, murmurs, rubs, gallops or clicks. No pedal edema. Gastrointestinal system: Abdomen is nondistended, soft and tender to palpation right upper quadrant around ostomy site.  Ileostomy with liquid stool and air in the bag, stoma pink and viable.  Midline wound with wound VAC in place. Central nervous system: Alert. No focal neurological deficits.  Moving extremity spontaneously Extremities: Symmetric 5 x 5 power. Skin: No rashes, lesions or ulcers Psychiatry: Judgement and insight appear poor. Mood & affect appropriate.     Data Reviewed: I have personally reviewed following labs and imaging studies  CBC: Recent Labs  Lab 11/23/21 0527 11/24/21 0446 11/25/21 0907 11/26/21 0443 11/26/21 1709 11/27/21 0424  WBC 16.2* 15.9* 15.4* 13.3*  --  10.2  HGB 10.2* 11.6* 10.6* 9.0* 10.6* 9.3*  HCT 30.4* 34.6* 30.7* 25.6* 31.0* 27.2*  MCV 91.6 92.0 88.2 87.7  --  90.7  PLT 329 365 407* 379  --  502*    Basic Metabolic Panel: Recent Labs  Lab 11/24/21 0446 11/25/21 0907 11/26/21 1709 11/27/21 0811  NA  --  133*  --  135  K  --  4.3  --  3.7  CL  --  107  --  105  CO2  --  23  --  22  GLUCOSE  --  107*  --  118*  BUN  --  8  --  7*  CREATININE 0.63 0.44  --  0.41*  CALCIUM  --  9.3  --  9.2  MG  --   --  2.1 2.0  PHOS  --   --  2.2* 2.1*    GFR: Estimated Creatinine Clearance:  80.5 mL/min (A) (by C-G formula based on SCr of 0.41 mg/dL (L)).  Liver Function Tests: Recent Labs  Lab 11/26/21 1709 11/27/21 0811  AST 20  --   ALT 12  --   ALKPHOS 117  --   BILITOT 0.8  --   PROT 6.7  --   ALBUMIN 2.3* 2.0*    CBG: Recent Labs  Lab 11/26/21 1744 11/26/21 2210 11/27/21 0004 11/27/21 0347 11/27/21 0731  GLUCAP 118* 135* 127* 124* 119*     Recent Results (from the past 240 hour(s))  Resp Panel by RT-PCR (Flu A&B, Covid) Nasopharyngeal Swab     Status: None   Collection Time: 11/17/21 12:08 PM   Specimen: Nasopharyngeal Swab; Nasopharyngeal(NP) swabs in vial transport medium  Result Value Ref Range Status   SARS Coronavirus 2 by RT PCR NEGATIVE NEGATIVE Final    Comment: (NOTE) SARS-CoV-2 target nucleic acids are NOT DETECTED.  The SARS-CoV-2 RNA is generally detectable in upper respiratory specimens during the acute phase of infection. The lowest concentration  of SARS-CoV-2 viral copies this assay can detect is 138 copies/mL. A negative result does not preclude SARS-Cov-2 infection and should not be used as the sole basis for treatment or other patient management decisions. A negative result may occur with  improper specimen collection/handling, submission of specimen other than nasopharyngeal swab, presence of viral mutation(s) within the areas targeted by this assay, and inadequate number of viral copies(<138 copies/mL). A negative result must be combined with clinical observations, patient history, and epidemiological information. The expected result is Negative.  Fact Sheet for Patients:  EntrepreneurPulse.com.au  Fact Sheet for Healthcare Providers:  IncredibleEmployment.be  This test is no t yet approved or cleared by the [place] FDA and  has been authorized for detection and/or diagnosis of SARS-CoV-2 by FDA under an Emergency Use Authorization (EUA). This EUA will remain  in effect (meaning  this test can be used) for the duration of the COVID-19 declaration under Section 564(b)(1) of the Act, 21 U.S.C.section 360bbb-3(b)(1), unless the authorization is terminated  or revoked sooner.       Influenza A by PCR NEGATIVE NEGATIVE Final   Influenza B by PCR NEGATIVE NEGATIVE Final    Comment: (NOTE) The Xpert Xpress SARS-CoV-2/FLU/RSV plus assay is intended as an aid in the diagnosis of influenza from Nasopharyngeal swab specimens and should not be used as a sole basis for treatment. Nasal washings and aspirates are unacceptable for Xpert Xpress SARS-CoV-2/FLU/RSV testing.  Fact Sheet for Patients: EntrepreneurPulse.com.au  Fact Sheet for Healthcare Providers: IncredibleEmployment.be  This test is not yet approved or cleared by the [place] FDA and has been authorized for detection and/or diagnosis of SARS-CoV-2 by FDA under an Emergency Use Authorization (EUA). This EUA will remain in effect (meaning this test can be used) for the duration of the COVID-19 declaration under Section 564(b)(1) of the Act, 21 U.S.C. section 360bbb-3(b)(1), unless the authorization is terminated or revoked.  Performed at [facility], [place] [address], [place], [place] [address]          Radiology Studies: DG Abd 1 View  Result Date: 11/26/2021 CLINICAL DATA:  Encounter for feeding tube placement EXAM: ABDOMEN - 1 VIEW COMPARISON:  None. FINDINGS: Feeding tube with tip in the stomach. Stylet remains. Tip in the gastric fundal region IMPRESSION: Feeding tube with tip in the stomach. Electronically Signed   By: Suzy Bouchard M.D.   On: 11/26/2021 16:05   CT HEAD WO CONTRAST (5MM)  Result Date: 11/25/2021 CLINICAL DATA:  Mental status change, weakness EXAM: CT HEAD WITHOUT CONTRAST TECHNIQUE: Contiguous axial images were obtained from the base of the skull through the vertex without intravenous contrast. COMPARISON:  None.  FINDINGS: Brain: No evidence of acute infarction, hemorrhage, cerebral edema, mass, mass effect, or midline shift. No hydrocephalus or extra-axial fluid collection. Mildly advanced volume loss for age. Periventricular white matter changes, likely the sequela of chronic small vessel ischemic disease. Vascular: No hyperdense vessel. Skull: Normal. Negative for fracture or focal lesion. Sinuses/Orbits: No acute finding. Other: The mastoid air cells are well aerated. IMPRESSION: IMPRESSION No acute intracranial process. Electronically Signed   By: Merilyn Baba M.D.   On: 11/25/2021 18:36   DG CHEST PORT 1 VIEW  Result Date: 11/27/2021 CLINICAL DATA:  Altered mental status EXAM: PORTABLE CHEST 1 VIEW COMPARISON:  11/26/2021 FINDINGS: Cardiac size is within normal limits. There is interval placement of enteric tube with its tip in the stomach. There are linear densities in the medial right mid and right  lower lung fields with interval worsening. Rest of the lung fields are unremarkable. There is no significant pleural effusion or pneumothorax. IMPRESSION: There are linear densities in the medial right mid and right lower lung fields with interval worsening suggesting worsening of atelectasis/pneumonitis. Electronically Signed   By: Elmer Picker M.D.   On: 11/27/2021 10:23   DG CHEST PORT 1 VIEW  Result Date: 11/26/2021 CLINICAL DATA:  Altered mental status. EXAM: PORTABLE CHEST 1 VIEW COMPARISON:  April 17, 2015. FINDINGS: The heart size and mediastinal contours are within normal limits. Left lung is clear. Mild right infrahilar atelectasis or infiltrate is noted. The visualized skeletal structures are unremarkable. IMPRESSION: Mild right infrahilar atelectasis or infiltrate. Electronically Signed   By: Marijo Conception M.D.   On: 11/26/2021 14:18        Scheduled Meds:  acetaminophen  1,000 mg Oral Q6H   chlorhexidine  15 mL Mouth Rinse BID   Chlorhexidine Gluconate Cloth  6 each Topical Daily    enoxaparin (LOVENOX) injection  40 mg Subcutaneous Q24H   feeding supplement  237 mL Oral TID BM   feeding supplement (OSMOLITE 1.5 CAL)  1,000 mL Per Tube D32K   folic acid  1 mg Oral Daily   mouth rinse  15 mL Mouth Rinse q12n4p   multivitamin with minerals  1 tablet Oral Daily   pantoprazole  40 mg Oral QHS   psyllium  1 packet Oral BID   Continuous Infusions:  sodium chloride 10 mL/hr at 11/22/21 0434   sodium chloride     piperacillin-tazobactam (ZOSYN)  IV 3.375 g (11/27/21 0939)   potassium PHOSPHATE IVPB (in mmol)       LOS: 10 days    Time spent: 35 minutes    Irine Seal, MD [facility]   To contact the attending provider between 7A-7P or the covering provider during after hours 7P-7A, please log into the web site [email or web address] and access using universal [place] password for that web site. If you do not have the password, please call the hospital operator.  11/27/2021, 11:28 AM

## 2021-11-27 NOTE — Progress Notes (Signed)
Nutrition Follow-up  DOCUMENTATION CODES:   Severe malnutrition in context of acute illness/injury  INTERVENTION:   Monitor magnesium, potassium, and phosphorus BID for at least 3 days, MD to replete as needed, as pt is at risk for refeeding syndrome.  -Advance Osmolite 1.5 to 30 ml/hr via small bore NGT  -D/c Ensure and Calorie Count  -Multivitamin with minerals daily via tube  -1 packet Juven BID via tube, each packet provides 95 calories, 2.5 grams of protein (collagen), and 9.8 grams of carbohydrate (3 grams sugar); also contains 7 grams of L-arginine and L-glutamine, 300 mg vitamin C, 15 mg vitamin E, 1.2 mcg vitamin B-12, 9.5 mg zinc, 200 mg calcium, and 1.5 g  Calcium Beta-hydroxy-Beta-methylbutyrate to support wound healing    TF Goal: -Advance Osmolite 1.5 by 10 ml every 24 hours to goal rate of 60 ml/hr. -provides 2160 kcals, 90g protein and 1097 ml H2O -Will need free water flushes of 200 ml every 6 hours (800 ml)  NUTRITION DIAGNOSIS:   Severe Malnutrition related to acute illness as evidenced by energy intake < or equal to 50% for > or equal to 5 days, moderate fat depletion, severe muscle depletion.  Ongoing.  GOAL:   Patient will meet greater than or equal to 90% of their needs  Progressing.  MONITOR:   PO intake, Supplement acceptance, Labs, Weight trends, I & O's   ASSESSMENT:   65 year old female who presents to the emergency department with sudden onset of abdominal pain and distention.CT scan of abdomen and pelvis shows findings consistent with free air with a large pneumoperitoneum.  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  11/27: s/p ex lap, right colectomy, ileostomy  Patient working with therapies at time of visit. Pt is currently tolerating trickle rate of Osmolite 1.5. Will advance to 30 ml/hr today and continue to monitor for signs of refeeding syndrome (currently Phos being repleted as well as folate).  Will  add Juven via tube to aid in wound healing.   Admission weight: 176 lbs. Current weight: 185 lbs.  Medications: IV folic acid, Metamucil, K phos infusion  Labs reviewed:  CBGs: 119-121 Low Phos  Low folate, iron  Diet Order:   Diet Order             Diet regular Room service appropriate? Yes; Fluid consistency: Thin  Diet effective now                   EDUCATION NEEDS:   Education needs have been addressed  Skin:  Skin Assessment: Skin Integrity Issues: Skin Integrity Issues:: Incisions Incisions: 11/27 abdomen  Last BM:  12/6 -type 6&7 -ileostomy  Height:   Ht Readings from Last 1 Encounters:  11/17/21 5\' 10"  (1.778 m)    Weight:   Wt Readings from Last 1 Encounters:  11/27/21 84.1 kg    BMI:  Body mass index is 26.6 kg/m.  Estimated Nutritional Needs:   Kcal:  1950-2150  Protein:  95-105g  Fluid:  2L/day  Clayton Bibles, MS, RD, LDN Inpatient Clinical Dietitian Contact information available via Amion

## 2021-11-27 NOTE — Consult Note (Addendum)
[facility] Nurse ostomy follow up Surgical PA at the bedside to assess wound appearance during dressing change.    Stoma type/location: RLQ ileostomy; pouch is intact with good seal and was changed Mon.  Will perform pouc change again on Fri.  Stoma red and viable when visualized through the pouch.  Mod amt liquid brown stool. Patient is not able to retain information. Unable to answer orientation questions and is confused and combative; unable to teach about ostomy skills and she will require total assistance with pouch application and emptying after discharge. Enrolled patient in [place] Start Discharge program: Yes, previously   [place] Nurse ostomy follow up Stoma type/location: LUQ mucus fistula Stomal assessment/size: 1 and 1/4 inch oval, dry and brown, flush with skin level.   Peristomal assessment: intact Treatment options for stomal/peristomal skin: none Output: 10cc small amount, brown mucus Ostomy pouching: 2pc. 2 and 1/4 inch pouching system with barrier ring Education provided: None; see above. 5 sets of supplies and educational materials left in room.  Use supplies: barrier ring, Kellie Simmering # [id number], wafer Kellie Simmering # [id number], pouch [place] # [id number]   [place] Nurse wound follow up Wound type: Full thickness midline abd wound is beefy red with mod amt pink drainage.  Pt was medicated for pain prior to the procedure and tolerated with mod amt discomfort and was very agitated and combative. Refer to previous notes for measurements.  Removed 2 piece black foam and reapplied 2 pieces to 152mm cont suction.  [place] team will change Fri if patient is still in the hospital at that time.  Julien Girt MSN, RN, [place], [place], [place]

## 2021-11-27 NOTE — Progress Notes (Addendum)
10 Days Post-Op  Subjective: CC: Reports no abdominal pain. Tolerating tf's without reported n/v. Continues to have ostomy output. No complaints.   Objective: Vital signs in last 24 hours: Temp:  [98.3 F (36.8 C)-99.2 F (37.3 C)] 98.3 F (36.8 C) (12/07 0555) Pulse Rate:  [84-92] 92 (12/07 0555) Resp:  [18] 18 (12/07 0555) BP: (123-144)/(56-74) 125/62 (12/07 0555) SpO2:  [95 %-96 %] 95 % (12/07 0555) Weight:  [84.1 kg] 84.1 kg (12/07 0500) Last BM Date: 11/26/21  Intake/Output from previous day: 12/06 0701 - 12/07 0700 In: 858.4 [I.V.:626.2; NG/GT:9; IV Piggyback:223.2] Out: 3400 [Urine:2200; Stool:1200] Intake/Output this shift: Total I/O In: 1319.9 [I.V.:814.5; NG/GT:407; IV Piggyback:98.4] Out: 310 [Urine:300; Stool:10]  PE: Gen:  Alert, NAD Card:  Reg Pulm:  CTAB, no W/R/R, effort normal Abd: Soft, mild distension, she reports no tenderness upon palpation of her abdomen. No rigidity or guarding. +BS. Ileostomy with liquid stool and air in bag. Stoma pink and viable. Mucus fistula present with no output in bag. Midline wound with wound vac present. Vac changed with WOCN. Wound as seen below with beefy red granulation tissue throughout Ext:  MAE's Skin: no rashes noted, warm and dry    Lab Results:  Recent Labs    11/26/21 0443 11/26/21 1709 11/27/21 0424  WBC 13.3*  --  10.2  HGB 9.0* 10.6* 9.3*  HCT 25.6* 31.0* 27.2*  PLT 379  --  502*   BMET Recent Labs    11/25/21 0907 11/27/21 0811  NA 133* 135  K 4.3 3.7  CL 107 105  CO2 23 22  GLUCOSE 107* 118*  BUN 8 7*  CREATININE 0.44 0.41*  CALCIUM 9.3 9.2   PT/INR No results for input(s): LABPROT, INR in the last 72 hours. CMP     Component Value Date/Time   NA 135 11/27/2021 0811   K 3.7 11/27/2021 0811   CL 105 11/27/2021 0811   CO2 22 11/27/2021 0811   GLUCOSE 118 (H) 11/27/2021 0811   BUN 7 (L) 11/27/2021 0811   CREATININE 0.41 (L) 11/27/2021 0811   CALCIUM 9.2 11/27/2021 0811   PROT  6.7 11/26/2021 1709   ALBUMIN 2.0 (L) 11/27/2021 0811   AST 20 11/26/2021 1709   ALT 12 11/26/2021 1709   ALKPHOS 117 11/26/2021 1709   BILITOT 0.8 11/26/2021 1709   GFRNONAA >60 11/27/2021 0811   Lipase     Component Value Date/Time   LIPASE 22 11/17/2021 0955    Studies/Results: DG Abd 1 View  Result Date: 11/26/2021 CLINICAL DATA:  Encounter for feeding tube placement EXAM: ABDOMEN - 1 VIEW COMPARISON:  None. FINDINGS: Feeding tube with tip in the stomach. Stylet remains. Tip in the gastric fundal region IMPRESSION: Feeding tube with tip in the stomach. Electronically Signed   By: Suzy Bouchard M.D.   On: 11/26/2021 16:05   CT HEAD WO CONTRAST (5MM)  Result Date: 11/25/2021 CLINICAL DATA:  Mental status change, weakness EXAM: CT HEAD WITHOUT CONTRAST TECHNIQUE: Contiguous axial images were obtained from the base of the skull through the vertex without intravenous contrast. COMPARISON:  None. FINDINGS: Brain: No evidence of acute infarction, hemorrhage, cerebral edema, mass, mass effect, or midline shift. No hydrocephalus or extra-axial fluid collection. Mildly advanced volume loss for age. Periventricular white matter changes, likely the sequela of chronic small vessel ischemic disease. Vascular: No hyperdense vessel. Skull: Normal. Negative for fracture or focal lesion. Sinuses/Orbits: No acute finding. Other: The mastoid air cells are well aerated. IMPRESSION:  IMPRESSION No acute intracranial process. Electronically Signed   By: Merilyn Baba M.D.   On: 11/25/2021 18:36   DG CHEST PORT 1 VIEW  Result Date: 11/27/2021 CLINICAL DATA:  Altered mental status EXAM: PORTABLE CHEST 1 VIEW COMPARISON:  11/26/2021 FINDINGS: Cardiac size is within normal limits. There is interval placement of enteric tube with its tip in the stomach. There are linear densities in the medial right mid and right lower lung fields with interval worsening. Rest of the lung fields are unremarkable. There is no  significant pleural effusion or pneumothorax. IMPRESSION: There are linear densities in the medial right mid and right lower lung fields with interval worsening suggesting worsening of atelectasis/pneumonitis. Electronically Signed   By: Elmer Picker M.D.   On: 11/27/2021 10:23   DG CHEST PORT 1 VIEW  Result Date: 11/26/2021 CLINICAL DATA:  Altered mental status. EXAM: PORTABLE CHEST 1 VIEW COMPARISON:  April 17, 2015. FINDINGS: The heart size and mediastinal contours are within normal limits. Left lung is clear. Mild right infrahilar atelectasis or infiltrate is noted. The visualized skeletal structures are unremarkable. IMPRESSION: Mild right infrahilar atelectasis or infiltrate. Electronically Signed   By: Marijo Conception M.D.   On: 11/26/2021 14:18    Anti-infectives: Anti-infectives (From admission, onward)    Start     Dose/Rate Route Frequency Ordered Stop   11/25/21 1000  piperacillin-tazobactam (ZOSYN) IVPB 3.375 g        3.375 g 12.5 mL/hr over 240 Minutes Intravenous Every 8 hours 11/25/21 0909     11/17/21 1830  piperacillin-tazobactam (ZOSYN) IVPB 3.375 g        3.375 g 12.5 mL/hr over 240 Minutes Intravenous Every 8 hours 11/17/21 1415 11/22/21 2359   11/17/21 1230  piperacillin-tazobactam (ZOSYN) IVPB 3.375 g        3.375 g 100 mL/hr over 30 Minutes Intravenous  Once 11/17/21 1220 11/17/21 1304        Assessment/Plan POD 10, 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 Harlow Asa 11/27 - CT 12/4 w/ trace peripancreatic and right paracolic gutter simple free fluid that extends inferiorly into the right pelvis with associated peritoneal wall enhancement/thickening. No organized drainable fluid collection.  - Restarted abx 12/5. WBC normalized. Afebrile - Minimal oral intake. Now with cortrak + TF's - Ileostomy output improved. Off Imodium. Cont BID metamucil - WOC following for ileostomy,  mucous fistula, wound VAC (MWF change) - Mobilize. PT/OT recommend SNF, TOC consult. - Pathology benign. CT distal descending/proximal sigmoid bowel luminal collapse -- radiology recommending colonoscopy as outpatient.    FEN - Regular diet as tolerated, TF's at 20cc/hr. Replace phos (2.1). K 3.7. Mg 2.0. Na 135 VTE - SCDs, Lovenox ID - zosyn 11/27 - 12/2. 12/5 >>  Foley - External  Encephalopathy/FTT - I called and spoke with the patient's family, her brother [name].  He reports prior to admission patient was living at home alone, ambulating without assistive devices, driving and doing ADLs.  He reports she is live down here for 7+ years and has lived independently.  Her closest family member is her sister in [place].  He reports he talks to her 1-2 times per week and does not recall her having any issues with memory that of been documented in the chart here.  The patient has had decreased mobility with PT/OT with generalized weakness and memory issues here. Avoid narcotics as able. [facility] following and working this  up. Greatly appreciate their assistance.   LOS: 10 days    Jillyn Ledger , [facility] 11/27/2021, 10:57 AM Please see Amion for pager number during day hours 7:00am-4:30pm

## 2021-11-28 ENCOUNTER — Inpatient Hospital Stay (HOSPITAL_COMMUNITY): Payer: Medicare HMO

## 2021-11-28 DIAGNOSIS — E871 Hypo-osmolality and hyponatremia: Secondary | ICD-10-CM | POA: Diagnosis not present

## 2021-11-28 DIAGNOSIS — K219 Gastro-esophageal reflux disease without esophagitis: Secondary | ICD-10-CM | POA: Diagnosis not present

## 2021-11-28 DIAGNOSIS — E43 Unspecified severe protein-calorie malnutrition: Secondary | ICD-10-CM | POA: Diagnosis not present

## 2021-11-28 DIAGNOSIS — E785 Hyperlipidemia, unspecified: Secondary | ICD-10-CM | POA: Diagnosis not present

## 2021-11-28 DIAGNOSIS — G9340 Encephalopathy, unspecified: Secondary | ICD-10-CM | POA: Diagnosis not present

## 2021-11-28 DIAGNOSIS — R198 Other specified symptoms and signs involving the digestive system and abdomen: Secondary | ICD-10-CM | POA: Diagnosis not present

## 2021-11-28 LAB — PHOSPHORUS: Phosphorus: 2.5 mg/dL (ref 2.5–4.6)

## 2021-11-28 LAB — CBC
HCT: 30.1 % — ABNORMAL LOW (ref 36.0–46.0)
Hemoglobin: 9.9 g/dL — ABNORMAL LOW (ref 12.0–15.0)
MCH: 30.4 pg (ref 26.0–34.0)
MCHC: 32.9 g/dL (ref 30.0–36.0)
MCV: 92.3 fL (ref 80.0–100.0)
Platelets: 553 10*3/uL — ABNORMAL HIGH (ref 150–400)
RBC: 3.26 MIL/uL — ABNORMAL LOW (ref 3.87–5.11)
RDW: 15.6 % — ABNORMAL HIGH (ref 11.5–15.5)
WBC: 11 10*3/uL — ABNORMAL HIGH (ref 4.0–10.5)
nRBC: 0 % (ref 0.0–0.2)

## 2021-11-28 LAB — URINE CULTURE: Culture: NO GROWTH

## 2021-11-28 LAB — GLUCOSE, CAPILLARY
Glucose-Capillary: 103 mg/dL — ABNORMAL HIGH (ref 70–99)
Glucose-Capillary: 105 mg/dL — ABNORMAL HIGH (ref 70–99)
Glucose-Capillary: 106 mg/dL — ABNORMAL HIGH (ref 70–99)
Glucose-Capillary: 113 mg/dL — ABNORMAL HIGH (ref 70–99)
Glucose-Capillary: 114 mg/dL — ABNORMAL HIGH (ref 70–99)
Glucose-Capillary: 120 mg/dL — ABNORMAL HIGH (ref 70–99)

## 2021-11-28 LAB — BASIC METABOLIC PANEL
Anion gap: 6 (ref 5–15)
BUN: 6 mg/dL — ABNORMAL LOW (ref 8–23)
CO2: 21 mmol/L — ABNORMAL LOW (ref 22–32)
Calcium: 9.1 mg/dL (ref 8.9–10.3)
Chloride: 107 mmol/L (ref 98–111)
Creatinine, Ser: 0.38 mg/dL — ABNORMAL LOW (ref 0.44–1.00)
GFR, Estimated: >60 mL/min (ref >60)
Glucose, Bld: 120 mg/dL — ABNORMAL HIGH (ref 70–99)
Potassium: 4 mmol/L (ref 3.5–5.1)
Sodium: 134 mmol/L — ABNORMAL LOW (ref 135–145)

## 2021-11-28 LAB — MAGNESIUM: Magnesium: 2 mg/dL (ref 1.7–2.4)

## 2021-11-28 MED ORDER — OSMOLITE 1.5 CAL PO LIQD
1000.0000 mL | ORAL | Status: DC
  Filled 2021-11-28 (×2): qty 1000

## 2021-11-28 NOTE — Progress Notes (Signed)
On call MD Cornett paged to notify that pt refused feeding tube.   Pts brother Milbert Coulter called to update that pt refused feeding tube.

## 2021-11-28 NOTE — Progress Notes (Signed)
Physical Therapy Treatment Patient Details Name: Sherry Hampton MRN: [id number] DOB: 1956-08-12 Today's Date: 11/28/2021   History of Present Illness Patient is 65 y.o. 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 Harlow Asa 11/17/21.  Pt with post op acute metabolic encephalpathy.  Per MD note, " The surgical team has talked to family who has stated this is not her baseline and comparing that she has never been diagnosed with any dementia or memory issues"  PMH significant for anemia, OA, GERD, HLD.    PT Comments    Pt talkative today however cognition still remains impaired.  Pt initiating movement however variable assist with bed mobility (with pt abruptly returning to laying position).  Pt not safe to mobilize OOB without more assist or lift (also RN reports ICU nurse coming to place NG tube).   Continue to recommend SNF upon d/c.   Recommendations for follow up therapy are one component of a multi-disciplinary discharge planning process, led by the attending physician.  Recommendations may be updated based on patient status, additional functional criteria and insurance authorization.  Follow Up Recommendations  Skilled nursing-short term rehab (<3 hours/day)     Assistance Recommended at Discharge Frequent or constant Supervision/Assistance  Equipment Recommendations  None recommended by PT    Recommendations for Other Services       Precautions / Restrictions Precautions Precautions: Fall Precaution Comments: abdominal surgery w/ wound vac, Rt/Lt colostomy, tube feed     Mobility  Bed Mobility Overal bed mobility: Needs Assistance Bed Mobility: Supine to Sit Rolling: Total assist;+2 for physical assistance;+2 for safety/equipment         General bed mobility comments: pt attempting to assist and able to move LEs around in bed however when attempt to sit EOB pt initiated and then requested  assist, pt being assisted and then stopped trying and returned to supine abruptly; pt denies pain as reason and unable to state why; due to decreased cognitive status today and variable assist from pt, pt returned to bed    Transfers                        Ambulation/Gait                   Stairs             Wheelchair Mobility    Modified Rankin (Stroke Patients Only)       Balance                                            Cognition Arousal/Alertness: Awake/alert Behavior During Therapy: Flat affect Overall Cognitive Status: Impaired/Different from baseline Area of Impairment: Orientation;Attention;Memory;Following commands;Safety/judgement;Awareness;Problem solving                 Orientation Level: Disoriented to;Place;Time;Situation Current Attention Level: Focused Memory: Decreased recall of precautions;Decreased short-term memory Following Commands: Follows one step commands inconsistently Safety/Judgement: Decreased awareness of safety;Decreased awareness of deficits Awareness: Intellectual Problem Solving: Decreased initiation;Slow processing;Difficulty sequencing;Requires verbal cues;Requires tactile cues General Comments: pt talkative today however variable with her answer with the same question        Exercises      General Comments        Pertinent Vitals/Pain Pain Assessment: Faces Faces Pain Scale: Hurts little  more Pain Location: abdomen Pain Descriptors / Indicators: Guarding;Grimacing (however pt denies pain when asked) Pain Intervention(s): Repositioned;Monitored during session    Home Living                          Prior Function            PT Goals (current goals can now be found in the care plan section) Progress towards PT goals: Progressing toward goals    Frequency    Min 2X/week      PT Plan Current plan remains appropriate    Co-evaluation               AM-PAC PT "6 Clicks" Mobility   Outcome Measure  Help needed turning from your back to your side while in a flat bed without using bedrails?: Total Help needed moving from lying on your back to sitting on the side of a flat bed without using bedrails?: Total Help needed moving to and from a bed to a chair (including a wheelchair)?: Total Help needed standing up from a chair using your arms (e.g., wheelchair or bedside chair)?: Total Help needed to walk in hospital room?: Total Help needed climbing 3-5 steps with a railing? : Total 6 Click Score: 6    End of Session   Activity Tolerance: Patient limited by fatigue;Other (comment) (limited by cognition) Patient left: with call bell/phone within reach;in bed;with bed alarm set   PT Visit Diagnosis: Muscle weakness (generalized) (M62.81);Difficulty in walking, not elsewhere classified (R26.2)     Time: 0932-3557 PT Time Calculation (min) (ACUTE ONLY): 19 min  Charges:  $Therapeutic Activity: 8-22 mins                    Jannette Spanner PT, DPT Acute Rehabilitation Services Pager: [phone] Office: [place] 11/28/2021, 4:32 PM

## 2021-11-28 NOTE — Progress Notes (Signed)
Nutrition Follow-up  DOCUMENTATION CODES:   Severe malnutrition in context of acute illness/injury  INTERVENTION:   Monitor magnesium, potassium, and phosphorus BID for at least 3 days, MD to replete as needed, as pt is at risk for refeeding syndrome.   -Advance Osmolite 1.5 to 40 ml/hr via small bore NGT   -Multivitamin with minerals daily via tube   -1 packet Juven BID via tube, each packet provides 95 calories, 2.5 grams of protein (collagen), and 9.8 grams of carbohydrate (3 grams sugar); also contains 7 grams of L-arginine and L-glutamine, 300 mg vitamin C, 15 mg vitamin E, 1.2 mcg vitamin B-12, 9.5 mg zinc, 200 mg calcium, and 1.5 g  Calcium Beta-hydroxy-Beta-methylbutyrate to support wound healing      TF Goal: -Advance Osmolite 1.5 by 10 ml every 24 hours to goal rate of 60 ml/hr. -provides 2160 kcals, 90g protein and 1097 ml H2O -Will need free water flushes of 200 ml every 6 hours (800 ml)   NUTRITION DIAGNOSIS:   Severe Malnutrition related to acute illness as evidenced by energy intake < or equal to 50% for > or equal to 5 days, moderate fat depletion, severe muscle depletion.  Ongoing.  GOAL:   Patient will meet greater than or equal to 90% of their needs  Progressing with TF  MONITOR:   PO intake, Supplement acceptance, Labs, Weight trends, I & O's  ASSESSMENT:   65 year old female who presents to the emergency department with sudden onset of abdominal pain and distention.CT scan of abdomen and pelvis shows findings consistent with free air with a large pneumoperitoneum.  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  Pt was tolerating TF of Osmolite 1.5 @ 30 ml/hr. Tube became clogged today and now position of tube being confirmed prior to restart of feedings. Once able to restart, advance to 40 ml/hr.   Admission weight: 176 lbs. Current weight: 185 lbs  Medications: IV folic acid, Multivitamin with minerals daily  Labs  reviewed:  CBGs: 103-129 Low Na  Diet Order:   Diet Order             Diet regular Room service appropriate? Yes; Fluid consistency: Thin  Diet effective now                   EDUCATION NEEDS:   Education needs have been addressed  Skin:  Skin Assessment: Skin Integrity Issues: Skin Integrity Issues:: Incisions Incisions: 11/27 abdomen  Last BM:  12/6 -type 6&7 -ileostomy  Height:   Ht Readings from Last 1 Encounters:  11/17/21 5\' 10"  (1.778 m)    Weight:   Wt Readings from Last 1 Encounters:  11/27/21 84.1 kg    BMI:  Body mass index is 26.6 kg/m.  Estimated Nutritional Needs:   Kcal:  1950-2150  Protein:  95-105g  Fluid:  2L/day  Clayton Bibles, MS, RD, LDN Inpatient Clinical Dietitian Contact information available via Amion

## 2021-11-28 NOTE — Progress Notes (Addendum)
PROGRESS NOTE/consult    Linsi Humann  YIR:[id number] DOB: 10/11/1956 DOA: 11/17/2021 PCP: Pcp, No    No chief complaint on file.   Brief Narrative:  Sherry Hampton is an 65 y.o. 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  The patient was surgically intervened with an extended right colectomy and ileostomy done by Dr. Ranae Palms on 11/17/2021.  She has had postoperative generalized weakness and has been followed by physical therapy.  Plans were to discharge the patient this week but her mental state has decreased in the last couple of days.  The surgical team has talked to family who has stated this is not her baseline and comparing that she has never been diagnosed with any dementia or memory issues.  She is currently confused and unable to provide further HPI.  She answers simple questions and denied headache, chest or back pain.     Assessment & Plan:   Principal Problem:   Perforated abdominal viscus Active Problems:   GERD (gastroesophageal reflux disease)   Hyperlipidemia   Hyponatremia   Acute blood loss anemia   Acute encephalopathy   Protein-calorie malnutrition, severe  1 acute metabolic encephalopathy -Hospitalist were consulted due to concern for confusion postoperatively. -Patient somewhat confused on exam today however answers simple questions.  Patient refusing to answer orientation questions. -Patient with no focal neurological deficits. -Ammonia level ordered at 16, within normal limits. -Anemia panel with a folate deficiency with folic level of 4.7.  Vitamin B12 levels elevated at 1255. -TSH within normal limits at 1.807.  HIV nonreactive. -Urinalysis nitrite negative, leukocytes negative. -Chest x-ray with linear densities in the medial right  mid and right lower lung fields with interval worsening suggesting worsening of atelectasis versus pneumonitis.  Patient afebrile.  No respiratory symptoms.  On empiric IV antibiotics. -Continue folic acid 1 mg IV daily and when consistently tolerating oral intake could transition to oral folic acid. -Minimize narcotics.  -IV Dilaudid discontinued.  -Oxycodone changed to 2.5 to 5 mg every 6 hours as needed.  -Pulmonary toilet.   2.  Hyponatremia -Urinalysis nitrite negative, leukocytes negative, specific gravity 1.016.  -Improved with hydration.   -DC IV fluids.   -Follow.  3.  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 Harlow Asa 11/27 -Per primary team.  4.  Hypophosphatemia -Repleted.  5.  Severe protein calorie malnutrition -On tube feeds. -Diet advanced per general surgery to regular diet.  6.  GERD -Continue PPI  7.  Hyperlipidemia -Resume statin when tolerating oral intake consistently.  8.  Folate deficiency -Continue folic acid 1 mg IV daily and when consistently tolerating oral intake could change to oral folic acid. -Outpatient follow-up.   DVT prophylaxis: SCDs Code Status: Full Family Communication: Updated patient.  No family at bedside. Disposition:   Status is: Inpatient  Remains inpatient appropriate because: Severity of illness       Consultants:  Chart hospitalist: Dr. Olevia Bowens 11/26/2021 Expiratory laparotomy, extended right colectomy, end ileostomy, mucous fistula per Dr.Gerkin 11/17/2021  Procedures:  CT abdomen and pelvis 11/17/2021, 11/24/2021 CT head 11/25/2021 Chest x-ray 11/26/2021, 11/27/2021 Abdominal films 11/26/2021  Antimicrobials:  IV Zosyn 11/17/2021>>>> 11/22/2021 IV Zosyn 11/25/2021>>>>   Subjective: Laying in bed.  Feeding tube in place.  More alert today.  No  chest pain.  No shortness of breath.  Abdominal  pain/discomfort slowly improving.  Still with some bouts of confusion.   Objective: Vitals:   11/27/21 0555 11/27/21 1330 11/27/21 2116 11/28/21 0649  BP: 125/62 (!) 144/64 122/61 126/62  Pulse: 92 85 83 76  Resp: 18 18 18 18   Temp: 98.3 F (36.8 C) 98.4 F (36.9 C) 98.2 F (36.8 C) 98.8 F (37.1 C)  TempSrc: Oral  Oral Oral  SpO2: 95% 93% 99% 92%  Weight:      Height:        Intake/Output Summary (Last 24 hours) at 11/28/2021 1229 Last data filed at 11/28/2021 1000 Gross per 24 hour  Intake 2732.71 ml  Output 3430 ml  Net -697.29 ml    Filed Weights   11/17/21 1928 11/27/21 0500  Weight: 79.8 kg 84.1 kg    Examination:  General exam: NAD.  Feeding tube in place.  More alert.  Respiratory system: Clear to auscultation bilaterally anterior lung fields.   Cardiovascular system: Regular rate rhythm no murmurs rubs or gallops.  No JVD.  No lower extremity edema.  Gastrointestinal system: Abdomen is soft, nondistended, decreasing tenderness to palpation in the right upper quadrant around ostomy site.  Ileostomy with some liquid stool.  Stoma pink and viable.  Midline wound with wound VAC in place.  Central nervous system: Alert.  Moving extremities spontaneously.  No focal neurological deficits.   Extremities: Symmetric 5 x 5 power. Skin: No rashes, lesions or ulcers Psychiatry: Judgement and insight appear poor-fair. Mood & affect appropriate.     Data Reviewed: I have personally reviewed following labs and imaging studies  CBC: Recent Labs  Lab 11/24/21 0446 11/25/21 0907 11/26/21 0443 11/26/21 1709 11/27/21 0424 11/28/21 0446  WBC 15.9* 15.4* 13.3*  --  10.2 11.0*  HGB 11.6* 10.6* 9.0* 10.6* 9.3* 9.9*  HCT 34.6* 30.7* 25.6* 31.0* 27.2* 30.1*  MCV 92.0 88.2 87.7  --  90.7 92.3  PLT 365 407* 379  --  502* 553*     Basic Metabolic Panel: Recent Labs  Lab 11/24/21 0446 11/25/21 0907 11/26/21 1709 11/27/21 0811 11/28/21 0446  NA  --  133*  --  135 134*   K  --  4.3  --  3.7 4.0  CL  --  107  --  105 107  CO2  --  23  --  22 21*  GLUCOSE  --  107*  --  118* 120*  BUN  --  8  --  7* 6*  CREATININE 0.63 0.44  --  0.41* 0.38*  CALCIUM  --  9.3  --  9.2 9.1  MG  --   --  2.1 2.0 2.0  PHOS  --   --  2.2* 2.1* 2.5     GFR: Estimated Creatinine Clearance: 80.5 mL/min (A) (by C-G formula based on SCr of 0.38 mg/dL (L)).  Liver Function Tests: Recent Labs  Lab 11/26/21 1709 11/27/21 0811  AST 20  --   ALT 12  --   ALKPHOS 117  --   BILITOT 0.8  --   PROT 6.7  --   ALBUMIN 2.3* 2.0*     CBG: Recent Labs  Lab 11/27/21 1938 11/27/21 2353 11/28/21 0428 11/28/21 0741 11/28/21 1112  GLUCAP 114* 129* 114* 120* 103*      Recent Results (from the past 240 hour(s))  Urine Culture     Status: None   Collection Time: 11/27/21 12:09 PM   Specimen:  Urine, Catheterized  Result Value Ref Range Status   Specimen Description   Final    URINE, CATHETERIZED Performed at [facility] [address], [place], [place] [address]    Special Requests   Final    NONE Performed at [facility], [place] [address] [place], [place] [address]    Culture   Final    NO GROWTH Performed at [facility], [place] [address], [place],  [address]    Report Status 11/28/2021 FINAL  Final          Radiology Studies: DG Abd 1 View  Result Date: 11/26/2021 CLINICAL DATA:  Encounter for feeding tube placement EXAM: ABDOMEN - 1 VIEW COMPARISON:  None. FINDINGS: Feeding tube with tip in the stomach. Stylet remains. Tip in the gastric fundal region IMPRESSION: Feeding tube with tip in the stomach. Electronically Signed   By: Suzy Bouchard M.D.   On: 11/26/2021 16:05   DG CHEST PORT 1 VIEW  Result Date: 11/27/2021 CLINICAL DATA:  Altered mental status EXAM: PORTABLE CHEST 1 VIEW COMPARISON:  11/26/2021 FINDINGS: Cardiac size is within normal limits. There is interval placement of enteric tube with  its tip in the stomach. There are linear densities in the medial right mid and right lower lung fields with interval worsening. Rest of the lung fields are unremarkable. There is no significant pleural effusion or pneumothorax. IMPRESSION: There are linear densities in the medial right mid and right lower lung fields with interval worsening suggesting worsening of atelectasis/pneumonitis. Electronically Signed   By: Elmer Picker M.D.   On: 11/27/2021 10:23        Scheduled Meds:  acetaminophen  1,000 mg Oral Q6H   chlorhexidine  15 mL Mouth Rinse BID   Chlorhexidine Gluconate Cloth  6 each Topical Daily   enoxaparin (LOVENOX) injection  40 mg Subcutaneous Q24H   feeding supplement (OSMOLITE 1.5 CAL)  1,000 mL Per Tube E10O   folic acid  1 mg Intravenous Daily   mouth rinse  15 mL Mouth Rinse q12n4p   multivitamin with minerals  1 tablet Per Tube Daily   nutrition supplement (JUVEN)  1 packet Per Tube BID BM   pantoprazole  40 mg Oral QHS   psyllium  1 packet Oral BID   Continuous Infusions:  sodium chloride 10 mL/hr at 11/22/21 0434   piperacillin-tazobactam (ZOSYN)  IV 3.375 g (11/28/21 0935)     LOS: 11 days    Time spent: 35 minutes    Irine Seal, MD [facility]   To contact the attending provider between 7A-7P or the covering provider during after hours 7P-7A, please log into the web site [email or web address] and access using universal [place] password for that web site. If you do not have the password, please call the hospital operator.  11/28/2021, 12:29 PM

## 2021-11-28 NOTE — Progress Notes (Signed)
Called to place small bore feeding tube after previous one clogged. Patient refusing- alert and oriented x2 on assessment. Patient stating "it hurts too much and I do not want it." When asked if she understood what tube was for, she stated it was for her eating/get food. RN educated patient on why NG tube is needed, still refusing at this time and requesting RNs to call sister.

## 2021-11-28 NOTE — Progress Notes (Signed)
Pts feeding tube clogged. 2 Rns attempted to unclog with no success. Maczis PA notified, ordered to remove feeding tube and replace for tube feeds. Tube removed at 1435. ICU charge RN called at 1454 by this writer to place new tube. ICU charge states someone will be up to place new tube.

## 2021-11-28 NOTE — Progress Notes (Signed)
11 Days Post-Op  Subjective: CC: Reports no abdominal pain. Tolerating tf's without reported n/v. Continues to have ostomy output. No complaints.  Objective: Vital signs in last 24 hours: Temp:  [98.2 F (36.8 C)-98.8 F (37.1 C)] 98.8 F (37.1 C) (12/08 0649) Pulse Rate:  [76-85] 76 (12/08 0649) Resp:  [18] 18 (12/08 0649) BP: (122-144)/(61-64) 126/62 (12/08 0649) SpO2:  [92 %-99 %] 92 % (12/08 0649) Last BM Date: 11/26/21  Intake/Output from previous day: 12/07 0701 - 12/08 0700 In: 4052.6 [P.O.:240; I.V.:2302.8; NG/GT:1012.2; IV Piggyback:497.6] Out: 9675 [Urine:3150; Emesis/NG output:80; Stool:710] Intake/Output this shift: Total I/O In: 0  Out: 100 [Urine:100]  PE: Gen:  Alert, NAD Card:  Reg Pulm:  CTAB, no W/R/R, effort normal Abd: Soft, mild distension, she reports no tenderness upon palpation of her abdomen. No rigidity or guarding. +BS. Ileostomy with liquid stool and air in bag. Stoma pink and viable. Mucus fistula present with no output in bag. Midline wound with wound vac present.  Ext:  MAE's Skin: no rashes noted, warm and dry Neuro: oriented to self and 2022, cannot tell me what building she is in or details regarding her surgery.   Lab Results:  Recent Labs    11/27/21 0424 11/28/21 0446  WBC 10.2 11.0*  HGB 9.3* 9.9*  HCT 27.2* 30.1*  PLT 502* 553*   BMET Recent Labs    11/27/21 0811 11/28/21 0446  NA 135 134*  K 3.7 4.0  CL 105 107  CO2 22 21*  GLUCOSE 118* 120*  BUN 7* 6*  CREATININE 0.41* 0.38*  CALCIUM 9.2 9.1   PT/INR No results for input(s): LABPROT, INR in the last 72 hours. CMP     Component Value Date/Time   NA 134 (L) 11/28/2021 0446   K 4.0 11/28/2021 0446   CL 107 11/28/2021 0446   CO2 21 (L) 11/28/2021 0446   GLUCOSE 120 (H) 11/28/2021 0446   BUN 6 (L) 11/28/2021 0446   CREATININE 0.38 (L) 11/28/2021 0446   CALCIUM 9.1 11/28/2021 0446   PROT 6.7 11/26/2021 1709   ALBUMIN 2.0 (L) 11/27/2021 0811   AST 20  11/26/2021 1709   ALT 12 11/26/2021 1709   ALKPHOS 117 11/26/2021 1709   BILITOT 0.8 11/26/2021 1709   GFRNONAA >60 11/28/2021 0446   Lipase     Component Value Date/Time   LIPASE 22 11/17/2021 0955    Studies/Results: DG Abd 1 View  Result Date: 11/26/2021 CLINICAL DATA:  Encounter for feeding tube placement EXAM: ABDOMEN - 1 VIEW COMPARISON:  None. FINDINGS: Feeding tube with tip in the stomach. Stylet remains. Tip in the gastric fundal region IMPRESSION: Feeding tube with tip in the stomach. Electronically Signed   By: Suzy Bouchard M.D.   On: 11/26/2021 16:05   DG CHEST PORT 1 VIEW  Result Date: 11/27/2021 CLINICAL DATA:  Altered mental status EXAM: PORTABLE CHEST 1 VIEW COMPARISON:  11/26/2021 FINDINGS: Cardiac size is within normal limits. There is interval placement of enteric tube with its tip in the stomach. There are linear densities in the medial right mid and right lower lung fields with interval worsening. Rest of the lung fields are unremarkable. There is no significant pleural effusion or pneumothorax. IMPRESSION: There are linear densities in the medial right mid and right lower lung fields with interval worsening suggesting worsening of atelectasis/pneumonitis. Electronically Signed   By: Elmer Picker M.D.   On: 11/27/2021 10:23   DG CHEST PORT 1 VIEW  Result Date:  11/26/2021 CLINICAL DATA:  Altered mental status. EXAM: PORTABLE CHEST 1 VIEW COMPARISON:  April 17, 2015. FINDINGS: The heart size and mediastinal contours are within normal limits. Left lung is clear. Mild right infrahilar atelectasis or infiltrate is noted. The visualized skeletal structures are unremarkable. IMPRESSION: Mild right infrahilar atelectasis or infiltrate. Electronically Signed   By: Marijo Conception M.D.   On: 11/26/2021 14:18    Anti-infectives: Anti-infectives (From admission, onward)    Start     Dose/Rate Route Frequency Ordered Stop   11/25/21 1000  piperacillin-tazobactam  (ZOSYN) IVPB 3.375 g        3.375 g 12.5 mL/hr over 240 Minutes Intravenous Every 8 hours 11/25/21 0909     11/17/21 1830  piperacillin-tazobactam (ZOSYN) IVPB 3.375 g        3.375 g 12.5 mL/hr over 240 Minutes Intravenous Every 8 hours 11/17/21 1415 11/22/21 2359   11/17/21 1230  piperacillin-tazobactam (ZOSYN) IVPB 3.375 g        3.375 g 100 mL/hr over 30 Minutes Intravenous  Once 11/17/21 1220 11/17/21 1304        Assessment/Plan POD 11, 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 Harlow Asa 11/27 - CT 12/4 w/ trace peripancreatic and right paracolic gutter simple free fluid that extends inferiorly into the right pelvis with associated peritoneal wall enhancement/thickening. No organized drainable fluid collection.  - Restarted abx 12/5. WBC normalized. Afebrile - Minimal oral intake. Now with cortrak + TF's - Ileostomy output improved. Off Imodium. Cont BID metamucil - WOC following for ileostomy, mucous fistula, wound VAC (MWF change) - Mobilize. PT/OT recommend SNF, TOC consult. - Pathology benign. CT distal descending/proximal sigmoid bowel luminal collapse -- radiology recommending colonoscopy as outpatient.    FEN - Regular diet as tolerated, TF's at 20cc/hr. Electrolytes improved today VTE - SCDs, Lovenox ID - zosyn 11/27 - 12/2. 12/5 >>  Foley - External  Encephalopathy/FTT - Alferd Apa called and spoke with the patient's family 12/7, her brother [name].  He reports prior to admission patient was living at home alone, ambulating without assistive devices, driving and doing ADLs.  He reports she has lived down here for 7+ years and has lived independently.  Her closest family member is her sister in [place].  He reports he talks to her 1-2 times per week and does not recall her having any issues with memory that of been documented in the chart here.  The patient has had decreased mobility  with PT/OT with generalized weakness and memory issues here. Avoid narcotics as able. TRH following and working this up. Greatly appreciate their assistance.   LOS: 11 days    [facility] 11/28/2021, 10:53 AM Please see Amion for pager number during day hours 7:00am-4:30pm

## 2021-11-29 DIAGNOSIS — G9340 Encephalopathy, unspecified: Secondary | ICD-10-CM | POA: Diagnosis not present

## 2021-11-29 DIAGNOSIS — R198 Other specified symptoms and signs involving the digestive system and abdomen: Secondary | ICD-10-CM | POA: Diagnosis not present

## 2021-11-29 DIAGNOSIS — E785 Hyperlipidemia, unspecified: Secondary | ICD-10-CM | POA: Diagnosis not present

## 2021-11-29 DIAGNOSIS — K219 Gastro-esophageal reflux disease without esophagitis: Secondary | ICD-10-CM | POA: Diagnosis not present

## 2021-11-29 DIAGNOSIS — E43 Unspecified severe protein-calorie malnutrition: Secondary | ICD-10-CM | POA: Diagnosis not present

## 2021-11-29 DIAGNOSIS — E871 Hypo-osmolality and hyponatremia: Secondary | ICD-10-CM | POA: Diagnosis not present

## 2021-11-29 LAB — CBC WITH DIFFERENTIAL/PLATELET
Abs Immature Granulocytes: 0.06 10*3/uL (ref 0.00–0.07)
Basophils Absolute: 0.1 10*3/uL (ref 0.0–0.1)
Basophils Relative: 1 %
Eosinophils Absolute: 0.5 10*3/uL (ref 0.0–0.5)
Eosinophils Relative: 4 %
HCT: 27.3 % — ABNORMAL LOW (ref 36.0–46.0)
Hemoglobin: 9.1 g/dL — ABNORMAL LOW (ref 12.0–15.0)
Immature Granulocytes: 1 %
Lymphocytes Relative: 12 %
Lymphs Abs: 1.3 10*3/uL (ref 0.7–4.0)
MCH: 30.4 pg (ref 26.0–34.0)
MCHC: 33.3 g/dL (ref 30.0–36.0)
MCV: 91.3 fL (ref 80.0–100.0)
Monocytes Absolute: 1.5 10*3/uL — ABNORMAL HIGH (ref 0.1–1.0)
Monocytes Relative: 14 %
Neutro Abs: 7.2 10*3/uL (ref 1.7–7.7)
Neutrophils Relative %: 68 %
Platelets: 592 10*3/uL — ABNORMAL HIGH (ref 150–400)
RBC: 2.99 MIL/uL — ABNORMAL LOW (ref 3.87–5.11)
RDW: 15.9 % — ABNORMAL HIGH (ref 11.5–15.5)
WBC: 10.5 10*3/uL (ref 4.0–10.5)
nRBC: 0.2 % (ref 0.0–0.2)

## 2021-11-29 LAB — GLUCOSE, CAPILLARY
Glucose-Capillary: 102 mg/dL — ABNORMAL HIGH (ref 70–99)
Glucose-Capillary: 112 mg/dL — ABNORMAL HIGH (ref 70–99)
Glucose-Capillary: 82 mg/dL (ref 70–99)
Glucose-Capillary: 89 mg/dL (ref 70–99)
Glucose-Capillary: 90 mg/dL (ref 70–99)
Glucose-Capillary: 94 mg/dL (ref 70–99)

## 2021-11-29 LAB — RENAL FUNCTION PANEL
Albumin: 2 g/dL — ABNORMAL LOW (ref 3.5–5.0)
Anion gap: 5 (ref 5–15)
BUN: 6 mg/dL — ABNORMAL LOW (ref 8–23)
CO2: 22 mmol/L (ref 22–32)
Calcium: 9.1 mg/dL (ref 8.9–10.3)
Chloride: 108 mmol/L (ref 98–111)
Creatinine, Ser: 0.53 mg/dL (ref 0.44–1.00)
GFR, Estimated: >60 mL/min (ref >60)
Glucose, Bld: 97 mg/dL (ref 70–99)
Phosphorus: 2.2 mg/dL — ABNORMAL LOW (ref 2.5–4.6)
Potassium: 3.8 mmol/L (ref 3.5–5.1)
Sodium: 135 mmol/L (ref 135–145)

## 2021-11-29 LAB — MAGNESIUM: Magnesium: 1.9 mg/dL (ref 1.7–2.4)

## 2021-11-29 MED ORDER — K PHOS MONO-SOD PHOS DI & MONO 155-852-130 MG PO TABS
250.0000 mg | ORAL_TABLET | Freq: Two times a day (BID) | ORAL | Status: AC
  Administered 2021-11-29 – 2021-12-01 (×6): 250 mg via ORAL
  Filled 2021-11-29 (×6): qty 1

## 2021-11-29 NOTE — Plan of Care (Signed)

## 2021-11-29 NOTE — Progress Notes (Signed)
PROGRESS NOTE/consult    Sherry Hampton  KDT:[id number] DOB: 03-14-1956 DOA: 11/17/2021 PCP: Pcp, No    No chief complaint on file.   Brief Narrative:  Sherry Hampton is an 65 y.o. 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  The patient was surgically intervened with an extended right colectomy and ileostomy done by Dr. Ranae Palms on 11/17/2021.  She has had postoperative generalized weakness and has been followed by physical therapy.  Plans were to discharge the patient this week but her mental state has decreased in the last couple of days.  The surgical team has talked to family who has stated this is not her baseline and comparing that she has never been diagnosed with any dementia or memory issues.  She is currently confused and unable to provide further HPI.  She answers simple questions and denied headache, chest or back pain.     Assessment & Plan:   Principal Problem:   Perforated abdominal viscus Active Problems:   GERD (gastroesophageal reflux disease)   Hyperlipidemia   Hyponatremia   Acute blood loss anemia   Acute encephalopathy   Protein-calorie malnutrition, severe  1 acute metabolic encephalopathy -Hospitalist were consulted due to concern for confusion postoperatively. -Patient improving clinically, less confused today, answering questions appropriately.  -Patient with no focal neurological deficits. -Ammonia level ordered at 16, within normal limits. -Anemia panel with a folate deficiency with folic level of 4.7.  Vitamin B12 levels elevated at 1255. -TSH within normal limits at 1.807.  HIV nonreactive. -Urinalysis nitrite negative, leukocytes negative. -Chest x-ray with linear densities in the medial right mid and right lower lung fields with  interval worsening suggesting worsening of atelectasis versus pneumonitis.  Patient afebrile.  No respiratory symptoms.  On empiric IV antibiotics. -Continue folic acid 1 mg IV daily and when consistently tolerating oral intake could transition to oral folic acid. -Minimize narcotics.  -IV Dilaudid discontinued.  -Oxycodone changed to 2.5 to 5 mg every 6 hours as needed.  -Pulmonary toilet.   2.  Hyponatremia -Urinalysis nitrite negative, leukocytes negative, specific gravity 1.016.  -Improved with hydration.   -IV fluids have been discontinued. -Follow.  3.  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 Harlow Asa 11/27 -Per primary team.  4.  Hypophosphatemia -Phosphorus at 2.2 . -Place on oral phosphorus twice daily x3 days.    5.  Severe protein calorie malnutrition -On tube feeds, however pended tube dislodged yesterday. -Diet advanced per general surgery to regular diet.  6.  GERD -PPI.  7.  Hyperlipidemia -Continue to hold statin and likely resume when tolerating oral intake consistently or on discharge.  8.  Folate deficiency -Continue folic acid 1 mg IV daily and when consistently tolerating oral intake could change to oral folic acid. -Outpatient follow-up.   DVT prophylaxis: SCDs Code Status: Full Family Communication: Updated patient.  No family at bedside. Disposition:   Status is: Inpatient  Remains inpatient appropriate because: Severity of illness       Consultants:  Chart hospitalist: Dr. Olevia Bowens 11/26/2021 Expiratory laparotomy, extended right colectomy, end ileostomy, mucous fistula per Dr.Gerkin 11/17/2021  Procedures:  CT abdomen and pelvis 11/17/2021, 11/24/2021 CT head 11/25/2021 Chest x-ray 11/26/2021, 11/27/2021 Abdominal films 11/26/2021  Antimicrobials:  IV Zosyn 11/17/2021>>>> 11/22/2021 IV Zosyn  11/25/2021>>>>   Subjective: Patient laying in bed.  PEG tube noted to have been dislodged yesterday.  Patient with no nausea or emesis.  Overall states she is feeling better.  No chest pain.  No shortness of breath.  States abdominal pain is improved.  No flatus.  No BM.  Seems more alert and less confused today.  Per RN patient with poor oral intake refusing food today.  Per nurse tech family at bedside and patient more alert, interacting with family appropriately and seems less confused significantly.  Objective: Vitals:   11/28/21 0649 11/28/21 1239 11/28/21 2130 11/29/21 0505  BP: 126/62 (!) 148/74 99/61 (!) 169/83  Pulse: 76 91 75 89  Resp: 18 18 16 18   Temp: 98.8 F (37.1 C) 97.6 F (36.4 C) 98 F (36.7 C) 97.6 F (36.4 C)  TempSrc: Oral Oral Oral Oral  SpO2: 92% 99% 94% 93%  Weight:      Height:        Intake/Output Summary (Last 24 hours) at 11/29/2021 1151 Last data filed at 11/29/2021 1136 Gross per 24 hour  Intake 389.77 ml  Output 2925 ml  Net -2535.23 ml    Filed Weights   11/17/21 1928 11/27/21 0500  Weight: 79.8 kg 84.1 kg    Examination:  General exam: NAD.  Feeding tube noted to have been dislodged.  More alert. Respiratory system: CTA B anterior lung fields.  No wheezes, no crackles, no rhonchi.  Normal respiratory effort. Cardiovascular system: RRR no murmurs rubs or gallops.  No JVD.  No lower extremity edema.   Gastrointestinal system: Abdomen is soft, nondistended, decreasing tenderness to palpation in the right upper quadrant around ostomy site.  Ileostomy with some liquid stool.  Stoma pink and viable.  Midline wound with wound VAC in place.  Central nervous system: Alert.  Moving extremities spontaneously.  No focal neurological deficits.  Extremities: Symmetric 5 x 5 power. Skin: No rashes, lesions or ulcers Psychiatry: Judgement and insight appear fair. Mood & affect appropriate.     Data Reviewed: I have personally reviewed following labs and  imaging studies  CBC: Recent Labs  Lab 11/25/21 0907 11/26/21 0443 11/26/21 1709 11/27/21 0424 11/28/21 0446 11/29/21 0522  WBC 15.4* 13.3*  --  10.2 11.0* 10.5  NEUTROABS  --   --   --   --   --  7.2  HGB 10.6* 9.0* 10.6* 9.3* 9.9* 9.1*  HCT 30.7* 25.6* 31.0* 27.2* 30.1* 27.3*  MCV 88.2 87.7  --  90.7 92.3 91.3  PLT 407* 379  --  502* 553* 592*     Basic Metabolic Panel: Recent Labs  Lab 11/24/21 0446 11/25/21 0907 11/26/21 1709 11/27/21 0811 11/28/21 0446 11/29/21 0522  NA  --  133*  --  135 134* 135  K  --  4.3  --  3.7 4.0 3.8  CL  --  107  --  105 107 108  CO2  --  23  --  22 21* 22  GLUCOSE  --  107*  --  118* 120* 97  BUN  --  8  --  7* 6* 6*  CREATININE 0.63 0.44  --  0.41* 0.38* 0.53  CALCIUM  --  9.3  --  9.2 9.1 9.1  MG  --   --  2.1 2.0 2.0 1.9  PHOS  --   --  2.2* 2.1* 2.5 2.2*     GFR: Estimated Creatinine Clearance: 80.5 mL/min (by C-G formula based on SCr  of 0.53 mg/dL).  Liver Function Tests: Recent Labs  Lab 11/26/21 1709 11/27/21 0811 11/29/21 0522  AST 20  --   --   ALT 12  --   --   ALKPHOS 117  --   --   BILITOT 0.8  --   --   PROT 6.7  --   --   ALBUMIN 2.3* 2.0* 2.0*     CBG: Recent Labs  Lab 11/28/21 1937 11/28/21 2336 11/29/21 0503 11/29/21 0716 11/29/21 1104  GLUCAP 105* 113* 90 89 94      Recent Results (from the past 240 hour(s))  Urine Culture     Status: None   Collection Time: 11/27/21 12:09 PM   Specimen: Urine, Catheterized  Result Value Ref Range Status   Specimen Description   Final    URINE, CATHETERIZED Performed at [facility] [address], [place], [place] [address]    Special Requests   Final    NONE Performed at [facility], [place] [address], [place], [place] [address]    Culture   Final    NO GROWTH Performed at [facility], [place] [address], [place], [place] [address]    Report Status 11/28/2021 FINAL  Final          Radiology  Studies: No results found.      Scheduled Meds:  acetaminophen  1,000 mg Oral Q6H   chlorhexidine  15 mL Mouth Rinse BID   Chlorhexidine Gluconate Cloth  6 each Topical Daily   enoxaparin (LOVENOX) injection  40 mg Subcutaneous Q24H   feeding supplement (OSMOLITE 1.5 CAL)  1,000 mL Per Tube S56C   folic acid  1 mg Intravenous Daily   mouth rinse  15 mL Mouth Rinse q12n4p   multivitamin with minerals  1 tablet Per Tube Daily   nutrition supplement (JUVEN)  1 packet Per Tube BID BM   pantoprazole  40 mg Oral QHS   phosphorus  250 mg Oral BID   psyllium  1 packet Oral BID   Continuous Infusions:  sodium chloride 10 mL/hr at 11/22/21 0434   piperacillin-tazobactam (ZOSYN)  IV 12.5 mL/hr at 11/29/21 1136     LOS: 12 days    Time spent: 35 minutes    Irine Seal, MD [facility]   To contact the attending provider between 7A-7P or the covering provider during after hours 7P-7A, please log into the web site [email or web address] and access using universal [place] password for that web site. If you do not have the password, please call the hospital operator.  11/29/2021, 11:51 AM

## 2021-11-29 NOTE — Consult Note (Addendum)
[facility] Nurse ostomy follow up Surgical PA at the bedside to assess wound appearance during dressing change.    Stoma type/location: RLQ ileostomy. Stoma is red and viable, 1 1/4 inch and slightly oval, slightly above skin level, small amt brown drainage.  Applied barrier ring and 2 piece pouch. Patient is not able to retain information. Unable to answer orientation questions and is confused and combative; unable to teach about ostomy skills and she will require total assistance with pouch application and emptying after discharge. Enrolled patient in [place] Start Discharge program: Yes, previously   [place] Nurse ostomy follow up Stoma type/location: LUQ mucus fistula; pouch is intact with good seal and was changed Wed. Output: small amount, brown mucus Ostomy pouching: 2pc. 2 and 1/4 inch pouching system with barrier ring Education provided: None; see above. 5 sets of supplies and educational materials left in room.  Use supplies: barrier ring, Kellie Simmering # [id number], wafer Kellie Simmering # [id number], pouch [place] # [id number]   [place] Nurse wound follow up Wound type: Full thickness midline abd wound is beefy red with mod amt pink drainage.  Pt was medicated for pain prior to the procedure and tolerated with mod amt discomfort and was very agitated and combative. Refer to previous notes for measurements.  Removed 2 pieces black foam and reapplied 2 pieces to 174mm cont suction.  [facility] team will change Mon if patient is still in the hospital at that time.  Julien Girt MSN, RN, [place], [place], [place]

## 2021-11-29 NOTE — Progress Notes (Signed)
12 Days Post-Op  Subjective: CC: Reports no abdominal pain. Tolerating tf's without reported n/v. Continues to have ostomy output. No complaints.  Objective: Vital signs in last 24 hours: Temp:  [97.6 F (36.4 C)-98 F (36.7 C)] 97.6 F (36.4 C) (12/09 0505) Pulse Rate:  [75-91] 89 (12/09 0505) Resp:  [16-18] 18 (12/09 0505) BP: (99-169)/(61-83) 169/83 (12/09 0505) SpO2:  [93 %-99 %] 93 % (12/09 0505) Last BM Date: 11/26/21  Intake/Output from previous day: 12/08 0701 - 12/09 0700 In: 271.6 [P.O.:120; IV Piggyback:151.6] Out: 2425 [Urine:2050; Stool:375] Intake/Output this shift: Total I/O In: 0  Out: 600 [Urine:400; Drains:50; Stool:150]  PE: Gen:  Alert, NAD Card:  Reg Pulm:  CTAB, no W/R/R, effort normal Abd: Soft, mild distension, she reports no tenderness upon palpation of her abdomen. No rigidity or guarding. +BS. Ileostomy with liquid stool and air in bag. Stoma pink and viable. Mucus fistula present with no output in bag. Midline wound granulating, does have a false bottom with a tunnel overlying the fascia - fibrinous exudate present, fascia in tact.    Ext:  MAE's Skin: no rashes noted, warm and dry Neuro: oriented to self and 2022, cannot tell me what building she is in or details regarding her surgery.   Lab Results:  Recent Labs    11/28/21 0446 11/29/21 0522  WBC 11.0* 10.5  HGB 9.9* 9.1*  HCT 30.1* 27.3*  PLT 553* 592*   BMET Recent Labs    11/28/21 0446 11/29/21 0522  NA 134* 135  K 4.0 3.8  CL 107 108  CO2 21* 22  GLUCOSE 120* 97  BUN 6* 6*  CREATININE 0.38* 0.53  CALCIUM 9.1 9.1   PT/INR No results for input(s): LABPROT, INR in the last 72 hours. CMP     Component Value Date/Time   NA 135 11/29/2021 0522   K 3.8 11/29/2021 0522   CL 108 11/29/2021 0522   CO2 22 11/29/2021 0522   GLUCOSE 97 11/29/2021 0522   BUN 6 (L) 11/29/2021 0522   CREATININE 0.53 11/29/2021 0522   CALCIUM 9.1 11/29/2021 0522   PROT 6.7 11/26/2021  1709   ALBUMIN 2.0 (L) 11/29/2021 0522   AST 20 11/26/2021 1709   ALT 12 11/26/2021 1709   ALKPHOS 117 11/26/2021 1709   BILITOT 0.8 11/26/2021 1709   GFRNONAA >60 11/29/2021 0522   Lipase     Component Value Date/Time   LIPASE 22 11/17/2021 0955    Studies/Results: No results found.  Anti-infectives: Anti-infectives (From admission, onward)    Start     Dose/Rate Route Frequency Ordered Stop   11/25/21 1000  piperacillin-tazobactam (ZOSYN) IVPB 3.375 g        3.375 g 12.5 mL/hr over 240 Minutes Intravenous Every 8 hours 11/25/21 0909     11/17/21 1830  piperacillin-tazobactam (ZOSYN) IVPB 3.375 g        3.375 g 12.5 mL/hr over 240 Minutes Intravenous Every 8 hours 11/17/21 1415 11/22/21 2359   11/17/21 1230  piperacillin-tazobactam (ZOSYN) IVPB 3.375 g        3.375 g 100 mL/hr over 30 Minutes Intravenous  Once 11/17/21 1220 11/17/21 1304        Assessment/Plan POD 12, 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 Harlow Asa 11/27 - CT 12/4 w/ trace peripancreatic and right paracolic gutter simple free fluid that extends inferiorly into the right pelvis with associated peritoneal wall enhancement/thickening. No  organized drainable fluid collection.  - Restarted abx 12/5. WBC normalized. Afebrile - Minimal oral intake. Was on cortrak + TF 12/7-8. Cortrak became clogged and patient refused replacement 12/8. - Ileostomy output improved. Off Imodium. Cont BID metamucil - WOC following for ileostomy, mucous fistula, wound VAC (MWF change) - Mobilize. PT/OT recommend SNF, TOC consulted - Pathology benign. CT distal descending/proximal sigmoid bowel luminal collapse -- radiology recommending colonoscopy as outpatient.    FEN - Regular diet as tolerated, will re-discuss TF with patient today; ultimately her gut is working and it is up to the patient to choose to eat. If she refuses feeding tube we will  discuss plan of care with family. Encephalopathy workup has been negative so far. Patient is medically stable for discharge to SNF but she is not eating enough to maintain adequate nutrition. VTE - SCDs, Lovenox ID - zosyn 11/27 - 12/2. 12/5 >> day #4. Will discuss stop date with MD Foley - External  Encephalopathy/FTT - Alferd Apa called and spoke with the patient's family 12/7, her brother [name].  He reports prior to admission patient was living at home alone, ambulating without assistive devices, driving and doing ADLs.  He reports she has lived down here for 7+ years and has lived independently.  Her closest family member is her sister in [place].  He reports he talks to her 1-2 times per week and does not recall her having any issues with memory that of been documented in the chart here.  The patient has had decreased mobility with PT/OT with generalized weakness and memory issues here. Avoid narcotics as able. TRH following and working this up. Greatly appreciate their assistance.   LOS: 12 days    [facility] 11/29/2021, 10:34 AM Please see Amion for pager number during day hours 7:00am-4:30pm

## 2021-11-30 DIAGNOSIS — E785 Hyperlipidemia, unspecified: Secondary | ICD-10-CM | POA: Diagnosis not present

## 2021-11-30 DIAGNOSIS — K219 Gastro-esophageal reflux disease without esophagitis: Secondary | ICD-10-CM | POA: Diagnosis not present

## 2021-11-30 DIAGNOSIS — R198 Other specified symptoms and signs involving the digestive system and abdomen: Secondary | ICD-10-CM | POA: Diagnosis not present

## 2021-11-30 DIAGNOSIS — E43 Unspecified severe protein-calorie malnutrition: Secondary | ICD-10-CM | POA: Diagnosis not present

## 2021-11-30 DIAGNOSIS — G9340 Encephalopathy, unspecified: Secondary | ICD-10-CM | POA: Diagnosis not present

## 2021-11-30 DIAGNOSIS — E871 Hypo-osmolality and hyponatremia: Secondary | ICD-10-CM | POA: Diagnosis not present

## 2021-11-30 LAB — GLUCOSE, CAPILLARY
Glucose-Capillary: 96 mg/dL (ref 70–99)
Glucose-Capillary: 99 mg/dL (ref 70–99)
Glucose-Capillary: 114 mg/dL — ABNORMAL HIGH (ref 70–99)
Glucose-Capillary: 117 mg/dL — ABNORMAL HIGH (ref 70–99)
Glucose-Capillary: 104 mg/dL — ABNORMAL HIGH (ref 70–99)

## 2021-11-30 LAB — RENAL FUNCTION PANEL
Albumin: 2 g/dL — ABNORMAL LOW (ref 3.5–5.0)
Anion gap: 5 (ref 5–15)
BUN: 10 mg/dL (ref 8–23)
CO2: 23 mmol/L (ref 22–32)
Calcium: 9.1 mg/dL (ref 8.9–10.3)
Chloride: 107 mmol/L (ref 98–111)
Creatinine, Ser: 0.52 mg/dL (ref 0.44–1.00)
GFR, Estimated: >60 mL/min (ref >60)
Glucose, Bld: 101 mg/dL — ABNORMAL HIGH (ref 70–99)
Phosphorus: 2.1 mg/dL — ABNORMAL LOW (ref 2.5–4.6)
Potassium: 3.7 mmol/L (ref 3.5–5.1)
Sodium: 135 mmol/L (ref 135–145)

## 2021-11-30 LAB — CBC
HCT: 24.8 % — ABNORMAL LOW (ref 36.0–46.0)
Hemoglobin: 8.4 g/dL — ABNORMAL LOW (ref 12.0–15.0)
MCH: 30.3 pg (ref 26.0–34.0)
MCHC: 33.9 g/dL (ref 30.0–36.0)
MCV: 89.5 fL (ref 80.0–100.0)
Platelets: 627 10*3/uL — ABNORMAL HIGH (ref 150–400)
RBC: 2.77 MIL/uL — ABNORMAL LOW (ref 3.87–5.11)
RDW: 15.5 % (ref 11.5–15.5)
WBC: 9.9 10*3/uL (ref 4.0–10.5)
nRBC: 0 % (ref 0.0–0.2)

## 2021-11-30 LAB — MAGNESIUM: Magnesium: 1.8 mg/dL (ref 1.7–2.4)

## 2021-11-30 MED ORDER — BOOST / RESOURCE BREEZE PO LIQD CUSTOM
1.0000 | Freq: Four times a day (QID) | ORAL | Status: DC
  Administered 2021-11-30 – 2021-12-03 (×10): 1 via ORAL

## 2021-11-30 MED ORDER — POTASSIUM PHOSPHATES 15 MMOLE/5ML IV SOLN
30.0000 mmol | Freq: Once | INTRAVENOUS | Status: AC
  Administered 2021-11-30: 30 mmol via INTRAVENOUS
  Filled 2021-11-30: qty 10

## 2021-11-30 MED ORDER — MAGNESIUM SULFATE 2 GM/50ML IV SOLN
2.0000 g | Freq: Once | INTRAVENOUS | Status: AC
  Administered 2021-11-30: 2 g via INTRAVENOUS
  Filled 2021-11-30: qty 50

## 2021-11-30 NOTE — Progress Notes (Signed)
13 Days Post-Op  Subjective: CC: Reports no abdominal pain. Enteral tube removed. Continues to have ostomy output. No complaints. Sister at bedside  Objective: Vital signs in last 24 hours: Temp:  [97.5 F (36.4 C)-99 F (37.2 C)] 97.5 F (36.4 C) (12/10 0419) Pulse Rate:  [79-91] 80 (12/10 0419) Resp:  [16-19] 16 (12/10 0419) BP: (108-126)/(59-88) 120/59 (12/10 0419) SpO2:  [93 %-97 %] 95 % (12/10 0419) Weight:  [83 kg] 83 kg (12/10 0500) Last BM Date: 11/29/21  Intake/Output from previous day: 12/09 0701 - 12/10 0700 In: 931.8 [P.O.:550; I.V.:235; IV Piggyback:146.8] Out: 1275 [Urine:750; Drains:125; Stool:400] Intake/Output this shift: No intake/output data recorded.  PE: Gen:  Alert, NAD Card:  Reg Pulm:  CTAB, no W/R/R, effort normal Abd: Soft, mild distension, she reports no tenderness upon palpation of her abdomen. No rigidity or guarding. +BS. Ileostomy with liquid stool and air in bag. Stoma pink and viable. Mucus fistula present with no output in bag. Midline wound granulating, does have a false bottom with a tunnel overlying the fascia - fibrinous exudate present, fascia in tact.  12/9:   Ext:  MAE's Skin: no rashes noted, warm and dry Neuro: oriented to self and 2022, cannot tell me what building she is in or details regarding her surgery.   Lab Results:  Recent Labs    11/29/21 0522 11/30/21 0455  WBC 10.5 9.9  HGB 9.1* 8.4*  HCT 27.3* 24.8*  PLT 592* 627*    BMET Recent Labs    11/29/21 0522 11/30/21 0455  NA 135 135  K 3.8 3.7  CL 108 107  CO2 22 23  GLUCOSE 97 101*  BUN 6* 10  CREATININE 0.53 0.52  CALCIUM 9.1 9.1    PT/INR No results for input(s): LABPROT, INR in the last 72 hours. CMP     Component Value Date/Time   NA 135 11/30/2021 0455   K 3.7 11/30/2021 0455   CL 107 11/30/2021 0455   CO2 23 11/30/2021 0455   GLUCOSE 101 (H) 11/30/2021 0455   BUN 10 11/30/2021 0455   CREATININE 0.52 11/30/2021 0455   CALCIUM 9.1  11/30/2021 0455   PROT 6.7 11/26/2021 1709   ALBUMIN 2.0 (L) 11/30/2021 0455   AST 20 11/26/2021 1709   ALT 12 11/26/2021 1709   ALKPHOS 117 11/26/2021 1709   BILITOT 0.8 11/26/2021 1709   GFRNONAA >60 11/30/2021 0455   Lipase     Component Value Date/Time   LIPASE 22 11/17/2021 0955    Studies/Results: No results found.  Anti-infectives: Anti-infectives (From admission, onward)    Start     Dose/Rate Route Frequency Ordered Stop   11/25/21 1000  piperacillin-tazobactam (ZOSYN) IVPB 3.375 g        3.375 g 12.5 mL/hr over 240 Minutes Intravenous Every 8 hours 11/25/21 0909     11/17/21 1830  piperacillin-tazobactam (ZOSYN) IVPB 3.375 g        3.375 g 12.5 mL/hr over 240 Minutes Intravenous Every 8 hours 11/17/21 1415 11/22/21 2359   11/17/21 1230  piperacillin-tazobactam (ZOSYN) IVPB 3.375 g        3.375 g 100 mL/hr over 30 Minutes Intravenous  Once 11/17/21 1220 11/17/21 1304        Assessment/Plan POD 13, 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 Harlow Asa 11/27 - CT 12/4 w/ trace peripancreatic and right paracolic gutter simple free fluid that extends inferiorly into the  right pelvis with associated peritoneal wall enhancement/thickening. No organized drainable fluid collection.  - Restarted abx 12/5. WBC normalized. Afebrile - Minimal oral intake. Was on cortrak + TF 12/7-8. Cortrak became clogged and patient refused replacement 12/8. - Ileostomy output improved. Off Imodium. Cont BID metamucil - WOC following for ileostomy, mucous fistula, wound VAC (MWF change) - Mobilize. PT/OT recommend SNF, TOC consulted - Pathology benign. CT distal descending/proximal sigmoid bowel luminal collapse -- radiology recommending colonoscopy as outpatient.    FEN - Regular diet as tolerated, patient refusing nasoenteric tube. Encephalopathy workup has been negative so far. Patient is medically stable for  discharge to SNF but she is not eating enough to maintain adequate nutrition.  May benefit from TPN if not able to consume enough enteral nutrition. VTE - SCDs, Lovenox ID - zosyn 11/27 - 12/2. 12/5 >> day #5. Foley - External  Encephalopathy/FTT - Alferd Apa called and spoke with the patient's family 12/7, her brother [name].  He reports prior to admission patient was living at home alone, ambulating without assistive devices, driving and doing ADLs.  He reports she has lived down here for 7+ years and has lived independently.  Her closest family member is her sister in [place].  He reports he talks to her 1-2 times per week and does not recall her having any issues with memory that of been documented in the chart here.  The patient has had decreased mobility with PT/OT with generalized weakness and memory issues here. Avoid narcotics as able. TRH following and working this up. Greatly appreciate their assistance.   LOS: 61 days    Clovis Riley , [facility] 11/30/2021, 7:51 AM Please see Amion for pager number during day hours 7:00am-4:30pm

## 2021-11-30 NOTE — Progress Notes (Signed)
PROGRESS NOTE/consult    Sherry Hampton  SEG:[id number] DOB: 23-Dec-1955 DOA: 11/17/2021 PCP: Pcp, No    No chief complaint on file.   Brief Narrative:  Sherry Hampton is an 65 y.o. 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  The patient was surgically intervened with an extended right colectomy and ileostomy done by Dr. Ranae Palms on 11/17/2021.  She has had postoperative generalized weakness and has been followed by physical therapy.  Plans were to discharge the patient this week but her mental state has decreased in the last couple of days.  The surgical team has talked to family who has stated this is not her baseline and comparing that she has never been diagnosed with any dementia or memory issues.  She is currently confused and unable to provide further HPI.  She answers simple questions and denied headache, chest or back pain.     Assessment & Plan:   Principal Problem:   Perforated abdominal viscus Active Problems:   GERD (gastroesophageal reflux disease)   Hyperlipidemia   Hyponatremia   Acute blood loss anemia   Acute encephalopathy   Protein-calorie malnutrition, severe  1 acute metabolic encephalopathy -Hospitalist were consulted due to concern for confusion postoperatively. -Patient improving clinically daily.  Less confused.  Following commands appropriately.  Answering questions appropriately.  Sister at bedside.  -Patient with no focal neurological deficits. -Ammonia level ordered at 16, within normal limits. -Anemia panel with a folate deficiency with folic level of 4.7.  Vitamin B12 levels elevated at 1255. -TSH within normal limits at 1.807.  HIV nonreactive. -Urinalysis nitrite negative, leukocytes negative. -Chest x-ray with linear densities in  the medial right mid and right lower lung fields with interval worsening suggesting worsening of atelectasis versus pneumonitis.  Patient afebrile.  No respiratory symptoms.  On empiric IV antibiotics. -Continue folic acid 1 mg IV daily and when consistently tolerating oral intake could transition to oral folic acid. -Minimize narcotics.  -IV Dilaudid discontinued.  -Oxycodone changed to 2.5 to 5 mg every 6 hours as needed.  -Pulmonary toilet.  -Nothing further to add at this time  2.  Hyponatremia -Urinalysis nitrite negative, leukocytes negative, specific gravity 1.016.  -Improved with hydration.   -IV fluids have been discontinued. -Follow.  3.  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 Harlow Asa 11/27 -Per primary team.  4.  Hypophosphatemia -Phosphorus at 2.1 . -Continue oral phosphorus supplementation.  5.  Severe protein calorie malnutrition -On tube feeds, however panda tube dislodged 11/28/2021. -Patient refusing for panda tube to be placed. -Diet advanced per general surgery to regular diet. -Patient with poor appetite.  6.  GERD -PPI.  7.  Hyperlipidemia -Continue to hold statin and could resume on discharge.  8.  Folate deficiency -Continue folic acid 1 mg IV daily and when consistently tolerating oral intake could change to oral folic acid. -Outpatient follow-up.  Nothing further to add at this point.  We will sign off.  Please call with questions.   DVT prophylaxis: SCDs Code Status: Full Family Communication: Updated patient.  No family at bedside. Disposition:   Status is: Inpatient  Remains inpatient appropriate because: Severity of illness       Consultants:  Chart hospitalist: Dr. Olevia Bowens 11/26/2021 Expiratory laparotomy, extended right colectomy,  end ileostomy, mucous fistula per Dr.Gerkin 11/17/2021  Procedures:  CT abdomen  and pelvis 11/17/2021, 11/24/2021 CT head 11/25/2021 Chest x-ray 11/26/2021, 11/27/2021 Abdominal films 11/26/2021  Antimicrobials:  IV Zosyn 11/17/2021>>>> 11/22/2021 IV Zosyn 11/25/2021>>>>   Subjective: Patient laying in bed.  Still with poor appetite.  No nausea or emesis.  States abdominal pain improving.  No flatus.  Some stool noted in the ostomy.  Confusion improved.  Sister at bedside.    Objective: Vitals:   11/29/21 2115 11/30/21 0205 11/30/21 0419 11/30/21 0500  BP: 108/61 118/62 (!) 120/59   Pulse: 80 79 80   Resp: 16 19 16    Temp: 98 F (36.7 C) 99 F (37.2 C) (!) 97.5 F (36.4 C)   TempSrc:   Oral   SpO2: 97% 93% 95%   Weight:    83 kg  Height:        Intake/Output Summary (Last 24 hours) at 11/30/2021 1217 Last data filed at 11/30/2021 1000 Gross per 24 hour  Intake 893.61 ml  Output 1300 ml  Net -406.39 ml    Filed Weights   11/17/21 1928 11/27/21 0500 11/30/21 0500  Weight: 79.8 kg 84.1 kg 83 kg    Examination:  General exam: NAD.  Respiratory system: Lungs clear to auscultation bilaterally.  No wheezes, no crackles, no rhonchi.  Normal respiratory effort.  Cardiovascular system: Regular rate and rhythm no murmurs rubs or gallops.  No JVD.  No lower extremity edema.  Gastrointestinal system: Abdomen is soft, nondistended, decreasing tenderness to palpation right upper quadrant around ostomy site.  Ileostomy with liquid stool.  Stoma pink and viable.  Midline wound with wound VAC in place  Central nervous system: Alert.  Moving extremities spontaneously.  No focal neurological deficits.  Extremities: Symmetric 5 x 5 power. Skin: No rashes, lesions or ulcers Psychiatry: Judgement and insight appear fair. Mood & affect appropriate.     Data Reviewed: I have personally reviewed following labs and imaging studies  CBC: Recent Labs  Lab 11/26/21 0443 11/26/21 1709 11/27/21 0424 11/28/21 0446 11/29/21 0522 11/30/21 0455  WBC 13.3*  --  10.2 11.0*  10.5 9.9  NEUTROABS  --   --   --   --  7.2  --   HGB 9.0* 10.6* 9.3* 9.9* 9.1* 8.4*  HCT 25.6* 31.0* 27.2* 30.1* 27.3* 24.8*  MCV 87.7  --  90.7 92.3 91.3 89.5  PLT 379  --  502* 553* 592* 627*     Basic Metabolic Panel: Recent Labs  Lab 11/25/21 0907 11/26/21 1709 11/27/21 0811 11/28/21 0446 11/29/21 0522 11/30/21 0455  NA 133*  --  135 134* 135 135  K 4.3  --  3.7 4.0 3.8 3.7  CL 107  --  105 107 108 107  CO2 23  --  22 21* 22 23  GLUCOSE 107*  --  118* 120* 97 101*  BUN 8  --  7* 6* 6* 10  CREATININE 0.44  --  0.41* 0.38* 0.53 0.52  CALCIUM 9.3  --  9.2 9.1 9.1 9.1  MG  --  2.1 2.0 2.0 1.9 1.8  PHOS  --  2.2* 2.1* 2.5 2.2* 2.1*     GFR: Estimated Creatinine Clearance: 80 mL/min (by C-G formula based on SCr of 0.52 mg/dL).  Liver Function Tests: Recent Labs  Lab 11/26/21 1709 11/27/21 0811 11/29/21 0522 11/30/21 0455  AST 20  --   --   --   ALT 12  --   --   --  ALKPHOS 117  --   --   --   BILITOT 0.8  --   --   --   PROT 6.7  --   --   --   ALBUMIN 2.3* 2.0* 2.0* 2.0*     CBG: Recent Labs  Lab 11/29/21 2114 11/29/21 2347 11/30/21 0411 11/30/21 0725 11/30/21 1111  GLUCAP 112* 102* 96 99 114*      Recent Results (from the past 240 hour(s))  Urine Culture     Status: None   Collection Time: 11/27/21 12:09 PM   Specimen: Urine, Catheterized  Result Value Ref Range Status   Specimen Description   Final    URINE, CATHETERIZED Performed at [facility] [address], [place], [place] [address]    Special Requests   Final    NONE Performed at [facility], [place] [address], [place], [place] [address]    Culture   Final    NO GROWTH Performed at [facility], [place] [address], [place],  [address]    Report Status 11/28/2021 FINAL  Final          Radiology Studies: No results found.      Scheduled Meds:  acetaminophen  1,000 mg Oral Q6H   chlorhexidine  15 mL Mouth Rinse BID    enoxaparin (LOVENOX) injection  40 mg Subcutaneous Q24H   feeding supplement  1 Container Oral QID   folic acid  1 mg Intravenous Daily   mouth rinse  15 mL Mouth Rinse q12n4p   multivitamin with minerals  1 tablet Per Tube Daily   nutrition supplement (JUVEN)  1 packet Per Tube BID BM   pantoprazole  40 mg Oral QHS   phosphorus  250 mg Oral BID   psyllium  1 packet Oral BID   Continuous Infusions:  sodium chloride 10 mL/hr at 11/22/21 0434   piperacillin-tazobactam (ZOSYN)  IV 3.375 g (11/30/21 0917)   potassium PHOSPHATE IVPB (in mmol) 30 mmol (11/30/21 1023)     LOS: 13 days    Time spent: 35 minutes    Irine Seal, MD [facility]   To contact the attending provider between 7A-7P or the covering provider during after hours 7P-7A, please log into the web site [email or web address] and access using universal [facility] password for that web site. If you do not have the password, please call the hospital operator.  11/30/2021, 12:17 PM

## 2021-12-01 ENCOUNTER — Inpatient Hospital Stay: Payer: Self-pay

## 2021-12-01 LAB — RENAL FUNCTION PANEL
Albumin: 2 g/dL — ABNORMAL LOW (ref 3.5–5.0)
Anion gap: 6 (ref 5–15)
BUN: 12 mg/dL (ref 8–23)
CO2: 23 mmol/L (ref 22–32)
Calcium: 9.2 mg/dL (ref 8.9–10.3)
Chloride: 109 mmol/L (ref 98–111)
Creatinine, Ser: 0.47 mg/dL (ref 0.44–1.00)
GFR, Estimated: >60 mL/min (ref >60)
Glucose, Bld: 95 mg/dL (ref 70–99)
Phosphorus: 2.6 mg/dL (ref 2.5–4.6)
Potassium: 3.7 mmol/L (ref 3.5–5.1)
Sodium: 138 mmol/L (ref 135–145)

## 2021-12-01 LAB — CBC
HCT: 27.2 % — ABNORMAL LOW (ref 36.0–46.0)
Hemoglobin: 9 g/dL — ABNORMAL LOW (ref 12.0–15.0)
MCH: 30.3 pg (ref 26.0–34.0)
MCHC: 33.1 g/dL (ref 30.0–36.0)
MCV: 91.6 fL (ref 80.0–100.0)
Platelets: 634 10*3/uL — ABNORMAL HIGH (ref 150–400)
RBC: 2.97 MIL/uL — ABNORMAL LOW (ref 3.87–5.11)
RDW: 15.9 % — ABNORMAL HIGH (ref 11.5–15.5)
WBC: 9 10*3/uL (ref 4.0–10.5)
nRBC: 0 % (ref 0.0–0.2)

## 2021-12-01 LAB — GLUCOSE, CAPILLARY
Glucose-Capillary: 101 mg/dL — ABNORMAL HIGH (ref 70–99)
Glucose-Capillary: 108 mg/dL — ABNORMAL HIGH (ref 70–99)
Glucose-Capillary: 113 mg/dL — ABNORMAL HIGH (ref 70–99)
Glucose-Capillary: 115 mg/dL — ABNORMAL HIGH (ref 70–99)
Glucose-Capillary: 130 mg/dL — ABNORMAL HIGH (ref 70–99)
Glucose-Capillary: 94 mg/dL (ref 70–99)
Glucose-Capillary: 97 mg/dL (ref 70–99)

## 2021-12-01 LAB — MAGNESIUM: Magnesium: 2.1 mg/dL (ref 1.7–2.4)

## 2021-12-01 MED ORDER — SODIUM CHLORIDE 0.9% FLUSH
10.0000 mL | Freq: Two times a day (BID) | INTRAVENOUS | Status: DC
  Administered 2021-12-01 – 2021-12-27 (×6): 10 mL
  Administered 2021-12-27: 20 mL
  Administered 2021-12-28 – 2021-12-31 (×4): 10 mL
  Administered 2021-12-31: 20 mL
  Administered 2022-01-01: 30 mL
  Administered 2022-01-01 – 2022-01-18 (×8): 10 mL

## 2021-12-01 MED ORDER — CHLORHEXIDINE GLUCONATE CLOTH 2 % EX PADS
6.0000 | MEDICATED_PAD | Freq: Every day | CUTANEOUS | Status: DC
  Administered 2021-12-01 – 2022-01-18 (×48): 6 via TOPICAL

## 2021-12-01 MED ORDER — SODIUM CHLORIDE 0.9% FLUSH
10.0000 mL | INTRAVENOUS | Status: DC | PRN
  Administered 2021-12-13 – 2021-12-27 (×3): 10 mL
  Administered 2021-12-31 (×2): 20 mL
  Administered 2022-01-11: 10 mL

## 2021-12-01 MED ORDER — OXYCODONE HCL 5 MG PO TABS
2.5000 mg | ORAL_TABLET | Freq: Three times a day (TID) | ORAL | Status: DC | PRN
  Administered 2021-12-02 – 2022-01-22 (×15): 2.5 mg via ORAL
  Filled 2021-12-01 (×15): qty 1

## 2021-12-01 NOTE — Progress Notes (Addendum)
14 Days Post-Op  Subjective: CC: Denies abdominal pain.  No complaints.  Sister is at bedside, states all she ate yesterday was 1 spoonful of yogurt and half Pedialyte/Ensure.  Required a lot of prompting to do that.  Also states that she did not get out of the bed at all yesterday.  Objective: Vital signs in last 24 hours: Temp:  [98 F (36.7 C)-98.7 F (37.1 C)] 98.7 F (37.1 C) (12/11 0557) Pulse Rate:  [71-84] 84 (12/11 0557) Resp:  [17-18] 18 (12/11 0557) BP: (99-117)/(62-74) 99/65 (12/11 0557) SpO2:  [94 %-95 %] 95 % (12/11 0557) Weight:  [83.7 kg] 83.7 kg (12/11 0557) Last BM Date: 11/30/21  Intake/Output from previous day: 12/10 0701 - 12/11 0700 In: 559.5 [P.O.:200; IV Piggyback:359.5] Out: 1478 [Urine:3480; Drains:125; Stool:250] Intake/Output this shift: No intake/output data recorded.  PE: Gen:  Alert, NAD Card:  Reg Pulm:  CTAB, no W/R/R, effort normal Abd: Soft, minimal distension, she reports no tenderness upon palpation of her abdomen.  Stoma pink and viable. Mucus fistula present with no output in bag.  Wound VAC to suction..  12/9:   Ext:  MAE's Skin: no rashes noted, warm and dry Neuro: oriented to self and 2022, cannot tell me what building she is in or details regarding her surgery.   Lab Results:  Recent Labs    11/30/21 0455 12/01/21 0456  WBC 9.9 9.0  HGB 8.4* 9.0*  HCT 24.8* 27.2*  PLT 627* 634*    BMET Recent Labs    11/30/21 0455 12/01/21 0456  NA 135 138  K 3.7 3.7  CL 107 109  CO2 23 23  GLUCOSE 101* 95  BUN 10 12  CREATININE 0.52 0.47  CALCIUM 9.1 9.2    PT/INR No results for input(s): LABPROT, INR in the last 72 hours. CMP     Component Value Date/Time   NA 138 12/01/2021 0456   K 3.7 12/01/2021 0456   CL 109 12/01/2021 0456   CO2 23 12/01/2021 0456   GLUCOSE 95 12/01/2021 0456   BUN 12 12/01/2021 0456   CREATININE 0.47 12/01/2021 0456   CALCIUM 9.2 12/01/2021 0456   PROT 6.7 11/26/2021 1709   ALBUMIN  2.0 (L) 12/01/2021 0456   AST 20 11/26/2021 1709   ALT 12 11/26/2021 1709   ALKPHOS 117 11/26/2021 1709   BILITOT 0.8 11/26/2021 1709   GFRNONAA >60 12/01/2021 0456   Lipase     Component Value Date/Time   LIPASE 22 11/17/2021 0955    Studies/Results: No results found.  Anti-infectives: Anti-infectives (From admission, onward)    Start     Dose/Rate Route Frequency Ordered Stop   11/25/21 1000  piperacillin-tazobactam (ZOSYN) IVPB 3.375 g        3.375 g 12.5 mL/hr over 240 Minutes Intravenous Every 8 hours 11/25/21 0909     11/17/21 1830  piperacillin-tazobactam (ZOSYN) IVPB 3.375 g        3.375 g 12.5 mL/hr over 240 Minutes Intravenous Every 8 hours 11/17/21 1415 11/22/21 2359   11/17/21 1230  piperacillin-tazobactam (ZOSYN) IVPB 3.375 g        3.375 g 100 mL/hr over 30 Minutes Intravenous  Once 11/17/21 1220 11/17/21 1304        Assessment/Plan POD 1, 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 Harlow Asa 11/27 - CT 12/4 w/ trace peripancreatic and right paracolic gutter simple free fluid that extends inferiorly into  the right pelvis with associated peritoneal wall enhancement/thickening. No organized drainable fluid collection.  - Restarted abx 12/5. WBC normalized. Afebrile - Minimal oral intake. Was on cortrak + TF 12/7-8. Cortrak became clogged and patient refused replacement 12/8. - Ileostomy output improved. Off Imodium. Cont BID metamucil - WOC following for ileostomy, mucous fistula, wound VAC (MWF change) - Mobilize. PT/OT recommend SNF, TOC consulted - Pathology benign. CT distal descending/proximal sigmoid bowel luminal collapse -- radiology recommending colonoscopy as outpatient.    FEN - Regular diet as tolerated, patient refusing nasoenteric tube. Encephalopathy workup has been negative so far. Patient is medically stable for discharge to SNF but she is not eating enough to  maintain adequate nutrition.  We will proceed with PICC line, anticipate start TPN tomorrow, continue calorie count.  She needs to mobilize. VTE - SCDs, Lovenox ID - zosyn 11/27 - 12/2. 12/5 >> day #6-stop antibiotics at this point. Foley - External  Encephalopathy/FTT - Alferd Apa called and spoke with the patient's family 12/7, her brother [name].  He reports prior to admission patient was living at home alone, ambulating without assistive devices, driving and doing ADLs.  He reports she has lived down here for 7+ years and has lived independently.  Her closest family member is her sister in [place].  He reports he talks to her 1-2 times per week and does not recall her having any issues with memory that of been documented in the chart here.  The patient has had decreased mobility with PT/OT with generalized weakness and memory issues here. Avoid narcotics as able. TRH following and working this up. Greatly appreciate their assistance.   LOS: 14 days    Clovis Riley , [facility] 12/01/2021, 8:17 AM Please see Amion for pager number during day hours 7:00am-4:30pm

## 2021-12-01 NOTE — Progress Notes (Signed)
Peripherally Inserted Central Catheter Placement  The IV Nurse has discussed with the patient and/or persons authorized to consent for the patient, the purpose of this procedure and the potential benefits and risks involved with this procedure.  The benefits include less needle sticks, lab draws from the catheter, and the patient may be discharged home with the catheter. Risks include, but not limited to, infection, bleeding, blood clot (thrombus formation), and puncture of an artery; nerve damage and irregular heartbeat and possibility to perform a PICC exchange if needed/ordered by physician.  Alternatives to this procedure were also discussed.  Bard Power PICC patient education guide, fact sheet on infection prevention and patient information card has been provided to patient /or left at bedside.    Consent obtained with brother via telephone  PICC Placement Documentation  PICC Double Lumen 12/01/21 PICC Right Brachial 39 cm 0 cm (Active)  Indication for Insertion or Continuance of Line Administration of hyperosmolar/irritating solutions (i.e. TPN, Vancomycin, etc.) 12/01/21 1000  Exposed Catheter (cm) 0 cm 12/01/21 1000  Site Assessment Clean;Dry;Intact 12/01/21 1000  Lumen #1 Status Flushed;Saline locked;Blood return noted 12/01/21 1000  Lumen #2 Status Flushed;Saline locked;Blood return noted 12/01/21 1000  Dressing Type Transparent;Securing device 12/01/21 1000  Dressing Status Clean;Dry;Intact 12/01/21 1000  Antimicrobial disc in place? Yes 12/01/21 1000  Safety Lock Not Applicable 24/46/28 6381  Line Care Connections checked and tightened 12/01/21 1000  Dressing Intervention New dressing 12/01/21 1000  Dressing Change Due 12/08/21 12/01/21 1000       Holley Bouche [name] 12/01/2021, 10:38 AM

## 2021-12-01 NOTE — Progress Notes (Signed)
PHARMACY - TOTAL PARENTERAL NUTRITION CONSULT NOTE   Indication: Prolonged ileus, patient refusing tube feeds  Patient Measurements: Height: 5\' 10"  (177.8 cm) Weight: 83.7 kg (184 lb 8.4 oz) IBW/kg (Calculated) : 68.5 TPN AdjBW (KG): 79.8 Body mass index is 26.48 kg/m. Usual Weight: 80 kg  Assessment: 65 y.o. 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 She is now extended right colectomy and ileostomy. She developed acute metabolic encephalopathy post-op. She was initiated on TF, but tube became clogged on 12/8 and patient refused replacement, and is not eating enough to maintain nutrition.  Calorie count has been ordered.  CCS has ordered TPN, but is ok waiting to start 12/12 pending calorie count and possible improvement in PO intake since she is medically stable for discharge.  Glucose / Insulin: No hx DM. AM glucose 95 Electrolytes: All currently WNL.  Most recently received KPhos BID x 6 doses per MD 12/9-12/11, Mg sulfate 2g 12/10, KPhos 29mmol 12/10. Renal: SCr < 1, BUN WNL Hepatic: WNL 12/6 Intake / Output: UOP 3488ml, Stool 228ml, Net I/O -3295, Drains -157ml MIVF: none GI Imaging: GI Surgeries / Procedures:  11/27: Expiratory laparotomy, extended right colectomy, end ileostomy  Central access: PICC ordered 12/11 TPN start date: plan 12/11  Nutritional Goals: Goal TPN rate is 80 mL/hr (provides 105 g of protein and 1977 kcals per day)  RD Assessment: Estimated Needs Total Energy Estimated Needs: 1950-2150 Total Protein Estimated Needs: 95-105g Total Fluid Estimated Needs: 2L/day  Current Nutrition:  Regular diet Juven BID  Plan:  Per discussion with Dr. Kae Heller, if PO intake continues to be poor, will begin TPN tomorrow 12/12.   If needed: Start TPN at  37mL/hr at 1800 Electrolytes in TPN will depend on labs tomorrow:  Na __mEq/L,  K __mEq/L,  Ca _mEq/L,  Mg _mEq/L,  Phos __mmol/L.  Cl:Ac __ Patient taking PO multivitamin therefore, will not add to TPN Initiate Sensitive q6h SSI and adjust as needed  MIVF per MD - none currently Monitor TPN labs on Mon/Thurs F/u calorie count  Peggyann Juba, PharmD, BCPS Pharmacy: [phone] 12/01/2021,8:30 AM

## 2021-12-02 LAB — COMPREHENSIVE METABOLIC PANEL
ALT: 12 U/L (ref 0–44)
AST: 19 U/L (ref 15–41)
Albumin: 2.2 g/dL — ABNORMAL LOW (ref 3.5–5.0)
Alkaline Phosphatase: 82 U/L (ref 38–126)
Anion gap: 6 (ref 5–15)
BUN: 14 mg/dL (ref 8–23)
CO2: 23 mmol/L (ref 22–32)
Calcium: 9.5 mg/dL (ref 8.9–10.3)
Chloride: 111 mmol/L (ref 98–111)
Creatinine, Ser: 0.58 mg/dL (ref 0.44–1.00)
GFR, Estimated: >60 mL/min (ref >60)
Glucose, Bld: 119 mg/dL — ABNORMAL HIGH (ref 70–99)
Potassium: 3.5 mmol/L (ref 3.5–5.1)
Sodium: 140 mmol/L (ref 135–145)
Total Bilirubin: 0.4 mg/dL (ref 0.3–1.2)
Total Protein: 6.8 g/dL (ref 6.5–8.1)

## 2021-12-02 LAB — GLUCOSE, CAPILLARY
Glucose-Capillary: 103 mg/dL — ABNORMAL HIGH (ref 70–99)
Glucose-Capillary: 106 mg/dL — ABNORMAL HIGH (ref 70–99)
Glucose-Capillary: 120 mg/dL — ABNORMAL HIGH (ref 70–99)
Glucose-Capillary: 111 mg/dL — ABNORMAL HIGH (ref 70–99)
Glucose-Capillary: 141 mg/dL — ABNORMAL HIGH (ref 70–99)
Glucose-Capillary: 130 mg/dL — ABNORMAL HIGH (ref 70–99)

## 2021-12-02 LAB — MAGNESIUM: Magnesium: 2 mg/dL (ref 1.7–2.4)

## 2021-12-02 LAB — CBC
HCT: 28.5 % — ABNORMAL LOW (ref 36.0–46.0)
Hemoglobin: 9.3 g/dL — ABNORMAL LOW (ref 12.0–15.0)
MCH: 30.2 pg (ref 26.0–34.0)
MCHC: 32.6 g/dL (ref 30.0–36.0)
MCV: 92.5 fL (ref 80.0–100.0)
Platelets: 594 10*3/uL — ABNORMAL HIGH (ref 150–400)
RBC: 3.08 MIL/uL — ABNORMAL LOW (ref 3.87–5.11)
RDW: 16.1 % — ABNORMAL HIGH (ref 11.5–15.5)
WBC: 8.2 10*3/uL (ref 4.0–10.5)
nRBC: 0 % (ref 0.0–0.2)

## 2021-12-02 LAB — PHOSPHORUS: Phosphorus: 2.3 mg/dL — ABNORMAL LOW (ref 2.5–4.6)

## 2021-12-02 LAB — TRIGLYCERIDES: Triglycerides: 95 mg/dL (ref <150)

## 2021-12-02 MED ORDER — TRAVASOL 10 % IV SOLN
INTRAVENOUS | Status: AC
  Filled 2021-12-02: qty 518.4

## 2021-12-02 MED ORDER — THIAMINE HCL 100 MG PO TABS
100.0000 mg | ORAL_TABLET | Freq: Every day | ORAL | Status: DC
  Administered 2021-12-02 – 2021-12-10 (×9): 100 mg via ORAL
  Filled 2021-12-02 (×9): qty 1

## 2021-12-02 MED ORDER — ADULT MULTIVITAMIN W/MINERALS CH
1.0000 | ORAL_TABLET | Freq: Every day | ORAL | Status: DC
Start: 2021-12-03 — End: 2021-12-03

## 2021-12-02 MED ORDER — POTASSIUM PHOSPHATES 15 MMOLE/5ML IV SOLN
30.0000 mmol | Freq: Once | INTRAVENOUS | Status: AC
  Administered 2021-12-02: 30 mmol via INTRAVENOUS
  Filled 2021-12-02: qty 10

## 2021-12-02 NOTE — Progress Notes (Addendum)
15 Days Post-Op  Subjective: CC: Denies abdominal pain.  No complaints.  Sister is at bedside. Patient is more oriented today and able to tell me she is at [facility] long because she had a hole in her intestines. She tells me that prior to surgery she was not a big eater and preferred foods other than those we offer in the hospital. She does enjoy strawberry ensure.  Her sister asks me if the patient would be able to go to a SNF in [place], as she and her brother live there and the patient has no other family in [place].  Objective: Vital signs in last 24 hours: Temp:  [97.4 F (36.3 C)-99 F (37.2 C)] 97.4 F (36.3 C) (12/12 0616) Pulse Rate:  [72-79] 72 (12/12 0616) Resp:  [14-16] 14 (12/12 0616) BP: (97-148)/(59-90) 148/88 (12/12 0616) SpO2:  [94 %-95 %] 94 % (12/12 0616) Last BM Date: 12/01/21  Intake/Output from previous day: 12/11 0701 - 12/12 0700 In: 310 [P.O.:300; I.V.:10] Out: 1500 [Urine:1000; Stool:500] Intake/Output this shift: Total I/O In: 120 [P.O.:120] Out: -   PE: Gen:  Alert, NAD Card:  Reg Pulm:  CTAB, no W/R/R, effort normal Abd: Soft, she reports no tenderness upon palpation of her abdomen.  Stoma pink and viable. Mucus fistula present with no output in bag.  Wound VAC to suction. Ext:  MAE's Skin: no rashes noted, warm and dry Neuro: oriented to person, place, time.   Lab Results:  Recent Labs    12/01/21 0456 12/02/21 0959  WBC 9.0 8.2  HGB 9.0* 9.3*  HCT 27.2* 28.5*  PLT 634* 594*   BMET Recent Labs    12/01/21 0456 12/02/21 0959  NA 138 140  K 3.7 3.5  CL 109 111  CO2 23 23  GLUCOSE 95 119*  BUN 12 14  CREATININE 0.47 0.58  CALCIUM 9.2 9.5   PT/INR No results for input(s): LABPROT, INR in the last 72 hours. CMP     Component Value Date/Time   NA 140 12/02/2021 0959   K 3.5 12/02/2021 0959   CL 111 12/02/2021 0959   CO2 23 12/02/2021 0959   GLUCOSE 119 (H) 12/02/2021 0959   BUN 14 12/02/2021 0959   CREATININE 0.58  12/02/2021 0959   CALCIUM 9.5 12/02/2021 0959   PROT 6.8 12/02/2021 0959   ALBUMIN 2.2 (L) 12/02/2021 0959   AST 19 12/02/2021 0959   ALT 12 12/02/2021 0959   ALKPHOS 82 12/02/2021 0959   BILITOT 0.4 12/02/2021 0959   GFRNONAA >60 12/02/2021 0959   Lipase     Component Value Date/Time   LIPASE 22 11/17/2021 0955    Studies/Results: [place] EKG SITE RITE  Result Date: 12/01/2021 If Site Rite image not attached, placement could not be confirmed due to current cardiac rhythm.   Anti-infectives: Anti-infectives (From admission, onward)    Start     Dose/Rate Route Frequency Ordered Stop   11/25/21 1000  piperacillin-tazobactam (ZOSYN) IVPB 3.375 g  Status:  Discontinued        3.375 g 12.5 mL/hr over 240 Minutes Intravenous Every 8 hours 11/25/21 0909 12/01/21 0822   11/17/21 1830  piperacillin-tazobactam (ZOSYN) IVPB 3.375 g        3.375 g 12.5 mL/hr over 240 Minutes Intravenous Every 8 hours 11/17/21 1415 11/22/21 2359   11/17/21 1230  piperacillin-tazobactam (ZOSYN) IVPB 3.375 g        3.375 g 100 mL/hr over 30 Minutes Intravenous  Once 11/17/21 1220 11/17/21  1304        Assessment/Plan 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 Harlow Asa 11/27 - CT 12/4 w/ trace peripancreatic and right paracolic gutter simple free fluid that extends inferiorly into the right pelvis with associated peritoneal wall enhancement/thickening. No organized drainable fluid collection.  - Restarted abx 12/5. WBC normalized. Afebrile - Minimal oral intake. Was on cortrak + TF 12/7-8. Cortrak became clogged and patient refused replacement 12/8. - Ileostomy output improved. Off Imodium. Cont BID metamucil - WOC following for ileostomy, mucous fistula, wound VAC (MWF change) - Mobilize. PT/OT recommend SNF, TOC consulted - Pathology benign. CT distal descending/proximal sigmoid bowel luminal collapse -- radiology  recommending colonoscopy as outpatient.    FEN - Regular diet as tolerated, patient refusing nasoenteric tube. Encephalopathy workup has been negative so far. Patient is medically stable for discharge to SNF but she is not eating enough to maintain adequate nutrition.  Continue TPN and calorie count. She needs to mobilize. VTE - SCDs, Lovenox ID - zosyn 11/27 - 12/2. 12/5-12/11 Foley - External  Encephalopathy/FTT - Alferd Apa called and spoke with the patient's family 12/7, her brother [name].  He reports prior to admission patient was living at home alone, ambulating without assistive devices, driving and doing ADLs.  He reports she has lived down here for 7+ years and has lived independently.  Her closest family member is her sister in [place].  He reports he talks to her 1-2 times per week and does not recall her having any issues with memory that of been documented in the chart here.  The patient has had decreased mobility with PT/OT with generalized weakness and memory issues here. Avoid narcotics as able. TRH following and working this up. Greatly appreciate their assistance.   LOS: 15 days    [name] , [facility] 12/02/2021, 10:51 AM Please see Amion for pager number during day hours 7:00am-4:30pm

## 2021-12-02 NOTE — Consult Note (Signed)
[place] Nurse wound follow up Patient receiving care in [place].  Sister in room, unable to view wound or stoma without feeling sick and dizzy. Sister is uncertain of where her sister will be discharged to. Patient is reliant totally on others for ostomy care. Wound type: mid-line abdominal incision Measurement: 12.5 cm x 4.5 cm x 5.8 cm at the base--could not visualize the base. The cotton tipped applicator went below my field of view. Wound bed: beefy red Drainage (amount, consistency, odor) thick light tan in cannister Periwound: intact Dressing procedure/placement/frequency: Two pieces of black foam removed. One piece placed as deep into the wound as possible. I was not, however, able to place the foam deep into the areas at the superior and inferior borders.  Patient was medicated prior to dressing change.  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 Pouch did not need changing.  [place] Nurse ostomy follow up Stoma type/location: RLQ ileostomy, pale pink, productive of thin brown effluent. Stomal assessment/size: 1 1/4 inch round, moist Peristomal assessment: intact Treatment options for stomal/peristomal skin: barrier ring Output: thin brown Ostomy pouching: 2pc. 2 and 1/4 inch skin barrier and pouch with barrier ring. Supplies available in clean supply room for 1302. Education provided: Patient cannot be taught. Sister that was present expressed no interest in learning. Enrolled patient in [place] Discharge program: Yes, previously.  Val Riles, RN, MSN, CWOCN, CNS-BC, pager [phone]

## 2021-12-02 NOTE — Progress Notes (Signed)
Nutrition Follow-up  DOCUMENTATION CODES:   Severe malnutrition in context of acute illness/injury  INTERVENTION:   Monitor magnesium, potassium, and phosphorus BID for at least 3 days, MD to replete as needed, as pt is at risk for refeeding syndrome.  -TPN management per Pharmacy -Recommend 100 mg Thiamine daily given refeeding risk  -D/c MVI, Calorie Count  -Continue Juven and Boost Breeze as tolerated  NUTRITION DIAGNOSIS:   Severe Malnutrition related to acute illness as evidenced by energy intake < or equal to 50% for > or equal to 5 days, moderate fat depletion, severe muscle depletion.  Ongoing.  GOAL:   Patient will meet greater than or equal to 90% of their needs  Not meeting. Will improve with TPN.  MONITOR:   PO intake, Supplement acceptance, Labs, Weight trends, I & O's, TPN   ASSESSMENT:   65 year old female who presents to the emergency department with sudden onset of abdominal pain and distention.CT scan of abdomen and pelvis shows findings consistent with free air with a large pneumoperitoneum.  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  11/27: s/p ex lap, right colectomy, ileostomy  Calorie count was started over the weekend for patient. Per documentation ,the only thing consumed over the weekend was 1 bite of yogurt and 1/2 Juven.  TPN to be started today. PICC placed 12/11. Will monitor labs for refeeding syndrome given has not met needs (never reached goal for TF when NGT was in place).  Admission weight: 176 lbs. Current weight: 184 lbs.  Medications: IV Folic acid, Metamucil  Labs reviewed: CBGs: 94-115  Diet Order:   Diet Order             Diet regular Room service appropriate? Yes; Fluid consistency: Thin  Diet effective now                   EDUCATION NEEDS:   Education needs have been addressed  Skin:  Skin Assessment: Skin Integrity Issues: Skin Integrity Issues:: Incisions Incisions:  11/27 abdomen  Last BM:  12/11- type 7  Height:   Ht Readings from Last 1 Encounters:  11/17/21 _0  (1.778 m)    Weight:   Wt Readings from Last 1 Encounters:  12/01/21 83.7 kg    BMI:  Body mass index is 26.48 kg/m.  Estimated Nutritional Needs:   Kcal:  1950-2150  Protein:  95-105g  Fluid:  2L/day  Clayton Bibles, MS, RD, LDN Inpatient Clinical Dietitian Contact information available via Amion

## 2021-12-02 NOTE — Care Management Important Message (Signed)
Important Message  Patient Details IM Letter placed in Patients room. Name: Sherry Hampton MRN: [id number] Date of Birth: [birth year]   Medicare Important Message Given:  Yes     Kerin Salen 12/02/2021, 2:43 PM

## 2021-12-02 NOTE — Progress Notes (Signed)
Physical Therapy Treatment Patient Details Name: Sherry Hampton MRN: [id number] DOB: [birth year] Today's Date: 12/02/2021   History of Present Illness Patient is 65 y.o. 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 Harlow Asa 11/17/21.  Pt with post op acute metabolic encephalpathy.  Per MD note, " The surgical team has talked to family who has stated this is not her baseline and comparing that she has never been diagnosed with any dementia or memory issues"  PMH significant for anemia, OA, GERD, HLD.    PT Comments    Pt able to participate more today and with more attempts at helping with mobility.  Continue to recommend SNF.   Recommendations for follow up therapy are one component of a multi-disciplinary discharge planning process, led by the attending physician.  Recommendations may be updated based on patient status, additional functional criteria and insurance authorization.  Follow Up Recommendations  Skilled nursing-short term rehab (<3 hours/day)     Assistance Recommended at Discharge Frequent or constant Supervision/Assistance  Equipment Recommendations  None recommended by PT    Recommendations for Other Services       Precautions / Restrictions Precautions Precautions: Fall Precaution Comments: abdominal surgery w/ wound vac, Rt/Lt colostomy, tube feed Restrictions Weight Bearing Restrictions: No     Mobility  Bed Mobility Overal bed mobility: Needs Assistance Bed Mobility: Supine to Sit Rolling: Max assist;+2 for physical assistance Sidelying to sit: Max assist;+2 for physical assistance   Sit to supine: Total assist;+2 for physical assistance   General bed mobility comments: Attempting to assist more today, but decreased motor planning    Transfers Overall transfer level: Needs assistance Equipment used: Ambulation equipment used Transfers: Sit to/from Stand Sit to Stand:  Max assist;+2 physical assistance;From elevated surface           General transfer comment: Pt stood to [place] from highly elevated bed.  Needed tactile assistance to get fully extended for pad placement and removal.  Worked on multiple reps of sit to stand from [place]. Transfer via Lift Equipment: Stedy  Ambulation/Gait                   Stairs             Wheelchair Mobility    Modified Rankin (Stroke Patients Only)       Balance                                            Cognition Arousal/Alertness: Awake/alert Behavior During Therapy: Flat affect Overall Cognitive Status: Impaired/Different from baseline Area of Impairment: Orientation;Attention;Memory;Following commands;Safety/judgement;Awareness;Problem solving                 Orientation Level: Disoriented to;Place;Time;Situation Current Attention Level: Focused Memory: Decreased recall of precautions;Decreased short-term memory Following Commands: Follows one step commands inconsistently Safety/Judgement: Decreased awareness of safety;Decreased awareness of deficits Awareness: Intellectual Problem Solving: Decreased initiation;Slow processing;Difficulty sequencing;Requires verbal cues;Requires tactile cues General Comments: Pt still with altered cognitive status, but improved since last session        Exercises      General Comments        Pertinent Vitals/Pain Pain Assessment: Faces Faces Pain Scale: Hurts little more Pain Location: abdomen Pain Descriptors / Indicators: Grimacing Pain Intervention(s): Monitored during session;Repositioned    Home Living  Prior Function            PT Goals (current goals can now be found in the care plan section) Acute Rehab PT Goals PT Goal Formulation: With patient Time For Goal Achievement: 12/16/21 Potential to Achieve Goals: Good Progress towards PT goals: Progressing toward  goals    Frequency    Min 2X/week      PT Plan Current plan remains appropriate    Co-evaluation              AM-PAC PT "6 Clicks" Mobility   Outcome Measure  Help needed turning from your back to your side while in a flat bed without using bedrails?: Total Help needed moving from lying on your back to sitting on the side of a flat bed without using bedrails?: Total Help needed moving to and from a bed to a chair (including a wheelchair)?: Total Help needed standing up from a chair using your arms (e.g., wheelchair or bedside chair)?: Total Help needed to walk in hospital room?: Total Help needed climbing 3-5 steps with a railing? : Total 6 Click Score: 6    End of Session   Activity Tolerance: Patient limited by fatigue Patient left: in bed;with call bell/phone within reach;with family/visitor present Nurse Communication: Need for lift equipment;Mobility status (nurse tech) PT Visit Diagnosis: Muscle weakness (generalized) (M62.81);Difficulty in walking, not elsewhere classified (R26.2)     Time: 1022-1050 PT Time Calculation (min) (ACUTE ONLY): 28 min  Charges:  $Therapeutic Activity: 23-37 mins                     Sharen Youngren L. Tamala Julian, [place]  12/02/2021    Galen Manila 12/02/2021, 11:06 AM

## 2021-12-02 NOTE — Progress Notes (Signed)
PHARMACY - TOTAL PARENTERAL NUTRITION CONSULT NOTE   Indication: Prolonged ileus, patient refusing tube feeds  Patient Measurements: Height: 5\' 10"  (177.8 cm) Weight: 83.7 kg (184 lb 8.4 oz) IBW/kg (Calculated) : 68.5 TPN AdjBW (KG): 79.8 Body mass index is 26.48 kg/m. Usual Weight: 80 kg  Assessment: 65 y.o. 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 She is now extended right colectomy and ileostomy. She developed acute metabolic encephalopathy post-op. She was initiated on TF, but tube became clogged on 12/8 and patient refused replacement, and is not eating enough to maintain nutrition.  Calorie count has been ordered.  [facility] has ordered TPN, but is ok waiting to start 12/12 pending calorie count and possible improvement in PO intake since she is medically stable for discharge.  Glucose / Insulin: No hx DM. AM glucose 119 Electrolytes: WNL except phos 2.3 - Most recently received KPhos BID x 6 doses per MD 12/9-12/11, Mg sulfate 2g 12/10, KPhos 16mmol 12/10. Renal: SCr < 1, BUN WNL Hepatic: WNL 12/12; Trig 95 (12/12) Intake / Output: UOP 1000 ml, Stool 553ml, Net I/O -1190, Drains 0  MIVF: none GI Imaging: GI Surgeries / Procedures:  11/27: Expiratory laparotomy, extended right colectomy, end ileostomy  Central access: PICC ordered 12/11 TPN start date: plan 12/12  Nutritional Goals: Goal TPN rate is 80 mL/hr (provides 105 g of protein and 1977 kcals per day)  RD Assessment: Estimated Needs Total Energy Estimated Needs: 1950-2150 Total Protein Estimated Needs: 95-105g Total Fluid Estimated Needs: 2L/day  Current Nutrition:  Regular diet Boost/Breeze QID, Juven BID  Plan:  Now:  KPhos 30 mmol IV x1 At 18:00:  Start TPN at 24mL/hr at 1800 Standard electrolytes  in TPN:  Na 7mEq/L, K 41mEq/L, Ca 64mEq/L, Mg 82mEq/L, and Phos 7mmol/L.  Cl:Ac 1:2 Patient taking PO multivitamin therefore, will not add to TPN Thiamine 100 mg PO daily given refeeding risk per RD Initiate Sensitive q6h SSI and adjust as needed  MIVF per MD - none currently Monitor TPN labs on Mon/Thurs   Gretta Arab PharmD, BCPS Clinical Pharmacist [facility] main pharmacy [phone] 12/02/2021 8:50 AM

## 2021-12-02 NOTE — TOC Progression Note (Signed)
Transition of Care [facility]) - Progression Note    Patient Details  Name: Sherry Hampton MRN: [id number] Date of Birth: 15-Jul-1956  [facility] CM/SW Contact  Lennart Pall, LCSW Phone Number: 12/02/2021, 2:47 PM  Clinical Narrative:    Met with pt and sister today and exchanging texts with brother Milbert Coulter.  Family has been inquiring about possibility of getting patient back to hospital in [place] to be closer to family.  Have explained to all that this is something that family would have to get set up = securing MD to accept and hospital to admit as well as transport to [place] which would be private pay.  They appear to understand this, however, have asked [facility] to follow up with brother to provide medical update as well per brother's request.  Have explained that [facility] will continue to work on SNF placement locally and they can continue to work towards getting pt back to [place] which may be easier once she has made more progress with her mobility and ability to manage her own medical issues/ ostomy care.  Currently, awaiting confirmation that pt is medically ready for SN transfer and will restart bed search and ins authorization.   Expected Discharge Plan: [place] Barriers to Discharge: Continued Medical Work up, Ship broker, SNF Pending bed offer  Expected Discharge Plan and Services Expected Discharge Plan: [place] In-house Referral: Clinical Social Work     Living arrangements for the past 2 months: Single Family Home                                       Social Determinants of Health (SDOH) Interventions    Readmission Risk Interventions No flowsheet data found.

## 2021-12-03 LAB — COMPREHENSIVE METABOLIC PANEL
ALT: 10 U/L (ref 0–44)
AST: 17 U/L (ref 15–41)
Albumin: 2.1 g/dL — ABNORMAL LOW (ref 3.5–5.0)
Alkaline Phosphatase: 79 U/L (ref 38–126)
Anion gap: 8 (ref 5–15)
BUN: 13 mg/dL (ref 8–23)
CO2: 22 mmol/L (ref 22–32)
Calcium: 9.6 mg/dL (ref 8.9–10.3)
Chloride: 108 mmol/L (ref 98–111)
Creatinine, Ser: 0.49 mg/dL (ref 0.44–1.00)
GFR, Estimated: >60 mL/min (ref >60)
Glucose, Bld: 132 mg/dL — ABNORMAL HIGH (ref 70–99)
Potassium: 3.8 mmol/L (ref 3.5–5.1)
Sodium: 138 mmol/L (ref 135–145)
Total Bilirubin: 0.3 mg/dL (ref 0.3–1.2)
Total Protein: 6.5 g/dL (ref 6.5–8.1)

## 2021-12-03 LAB — GLUCOSE, CAPILLARY
Glucose-Capillary: 134 mg/dL — ABNORMAL HIGH (ref 70–99)
Glucose-Capillary: 135 mg/dL — ABNORMAL HIGH (ref 70–99)
Glucose-Capillary: 123 mg/dL — ABNORMAL HIGH (ref 70–99)
Glucose-Capillary: 140 mg/dL — ABNORMAL HIGH (ref 70–99)
Glucose-Capillary: 143 mg/dL — ABNORMAL HIGH (ref 70–99)

## 2021-12-03 LAB — MAGNESIUM: Magnesium: 1.9 mg/dL (ref 1.7–2.4)

## 2021-12-03 LAB — PHOSPHORUS: Phosphorus: 2.6 mg/dL (ref 2.5–4.6)

## 2021-12-03 MED ORDER — FOLIC ACID 1 MG PO TABS
1.0000 mg | ORAL_TABLET | Freq: Every day | ORAL | Status: DC
  Administered 2021-12-04 – 2022-01-20 (×46): 1 mg via ORAL
  Filled 2021-12-03 (×48): qty 1

## 2021-12-03 MED ORDER — JUVEN PO PACK
1.0000 | PACK | Freq: Two times a day (BID) | ORAL | Status: DC
  Administered 2021-12-03 – 2022-01-21 (×76): 1 via ORAL
  Filled 2021-12-03 (×94): qty 1

## 2021-12-03 MED ORDER — ADULT MULTIVITAMIN W/MINERALS CH
1.0000 | ORAL_TABLET | Freq: Every day | ORAL | Status: DC
  Administered 2021-12-03 – 2022-01-20 (×47): 1 via ORAL
  Filled 2021-12-03 (×49): qty 1

## 2021-12-03 MED ORDER — ENSURE ENLIVE PO LIQD
237.0000 mL | Freq: Two times a day (BID) | ORAL | Status: DC
  Administered 2021-12-03 – 2021-12-07 (×7): 237 mL via ORAL

## 2021-12-03 MED ORDER — TRAVASOL 10 % IV SOLN
INTRAVENOUS | Status: AC
  Filled 2021-12-03: qty 1036.8

## 2021-12-03 NOTE — Progress Notes (Signed)
Occupational Therapy Treatment Patient Details Name: Sherry Hampton MRN: [id number] DOB: 02/12/1956 Today's Date: 12/03/2021   History of present illness Patient is 65 y.o. 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 Harlow Asa 11/17/21.  Pt with post op acute metabolic encephalpathy.  Per MD note, " The surgical team has talked to family who has stated this is not her baseline and comparing that she has never been diagnosed with any dementia or memory issues"  PMH significant for anemia, OA, GERD, HLD.   OT comments  Pt progressing towards acute OT goals. Pt with noted improvement in functional transfers this session compared to last session with this OT. Remains to have posterior and right lateral lean tendencies. Cognition improved from last session but still not back to baseline. Pt able to complete bed mobility with +2 mod physical assist, stood 3x from bariatric Stedy with min A +2 physical assist. Able to come to full upright position and maintain standing about 10 seconds each time. Up in recliner at end of session, sister present. D/c recommendation remains appropriate.     Recommendations for follow up therapy are one component of a multi-disciplinary discharge planning process, led by the attending physician.  Recommendations may be updated based on patient status, additional functional criteria and insurance authorization.    Follow Up Recommendations  Skilled nursing-short term rehab (<3 hours/day)    Assistance Recommended at Discharge Frequent or constant Supervision/Assistance  Equipment Recommendations  Other (comment) (defer to next venue)    Recommendations for Other Services      Precautions / Restrictions Precautions Precautions: Fall Precaution Comments: abdominal surgery w/ wound vac, Rt/Lt colostomy Restrictions Weight Bearing Restrictions: No       Mobility Bed  Mobility Overal bed mobility: Needs Assistance Bed Mobility: Supine to Sit     Supine to sit: Mod assist;+2 for physical assistance;HOB elevated     General bed mobility comments: Difficulty sequencing. Physical asssit to pivot hips to full EOB position and for trunk elevation. Posterior lean.    Transfers Overall transfer level: Needs assistance Equipment used: Ambulation equipment used Transfers: Sit to/from Stand Sit to Stand: Min assist;+2 physical assistance;From elevated surface           General transfer comment: Pt stood 3x utilizing both hands on Stedy frame and sequencing cues. Cues to come to full upright position.     Balance Overall balance assessment: Needs assistance Sitting-balance support: Feet supported;Bilateral upper extremity supported Sitting balance-Leahy Scale: Poor Sitting balance - Comments: posterior lean, BUE and up to min A for static sitting. Postural control: Posterior lean Standing balance support: Reliant on assistive device for balance;During functional activity;Bilateral upper extremity supported Standing balance-Leahy Scale: Poor Standing balance comment: Stedy frame and up to min A. Able to stand about 10 seconds before fatiguing into seated position. stood 3x.                           ADL either performed or assessed with clinical judgement   ADL Overall ADL's : Needs assistance/impaired                         Toilet Transfer: Minimal assistance;+2 for physical assistance;Cueing for sequencing;Cueing for safety (Bariatric Stedy) Toilet Transfer Details (indicate cue type and reason): +2 min A to stand from elevated EOB and come to full upright position. Utilized Stedy. Toileting-  Clothing Manipulation and Hygiene: Moderate assistance;Sit to/from stand Toileting - Clothing Manipulation Details (indicate cue type and reason): pt holding onto stedy frame with both hands, total A for pericare       General ADL  Comments: Pt much improved from last session with this OT. Cognition not back to baseline yet but able to follow one step commands better this session. Did well with Stedy, able to stand 3x utilizing this equipment and min A +2 physical assist. Sequencing, step-by-step cues needed.    Extremity/Trunk Assessment Upper Extremity Assessment Upper Extremity Assessment: Generalized weakness;Difficult to assess due to impaired cognition   Lower Extremity Assessment Lower Extremity Assessment: Defer to PT evaluation        Vision       Perception     Praxis      Cognition Arousal/Alertness: Awake/alert Behavior During Therapy: Flat affect;WFL for tasks assessed/performed Overall Cognitive Status: Impaired/Different from baseline Area of Impairment: Orientation;Attention;Memory;Following commands;Safety/judgement;Awareness;Problem solving                 Orientation Level: Disoriented to;Place;Time;Situation Current Attention Level: Focused Memory: Decreased recall of precautions;Decreased short-term memory Following Commands: Follows one step commands inconsistently Safety/Judgement: Decreased awareness of safety;Decreased awareness of deficits Awareness: Intellectual Problem Solving: Decreased initiation;Slow processing;Difficulty sequencing;Requires verbal cues;Requires tactile cues General Comments: Pt still with altered cognitive status, but improved since last session          Exercises     Shoulder Instructions       General Comments      Pertinent Vitals/ Pain       Pain Assessment: Faces Faces Pain Scale: Hurts little more Pain Location: abdomen Pain Descriptors / Indicators: Grimacing Pain Intervention(s): Monitored during session;Limited activity within patient's tolerance;Repositioned  Home Living                                          Prior Functioning/Environment              Frequency  Min 2X/week        Progress  Toward Goals  OT Goals(current goals can now be found in the care plan section)  Progress towards OT goals: Progressing toward goals  Acute Rehab OT Goals OT Goal Formulation: With patient Time For Goal Achievement: 12/17/21 Potential to Achieve Goals: Good ADL Goals Pt Will Perform Grooming: sitting;with max assist Pt Will Perform Lower Body Bathing: with max assist;sit to/from stand Pt Will Perform Lower Body Dressing: with max assist;sit to/from stand Pt Will Transfer to Toilet: with max assist;squat pivot transfer;stand pivot transfer;with +2 assist;bedside commode Pt Will Perform Toileting - Clothing Manipulation and hygiene: with max assist;sitting/lateral leans;sit to/from stand Additional ADL Goal #1: Pt will improve sitting balance to good static and fair+ dynamic in order to increase safety and participation during seated ADLs. Additional ADL Goal #2: Pt will complete bed mobility with min +2 assist to prepare for EOB/OOB ADLs.  Plan Discharge plan remains appropriate    Co-evaluation                 AM-PAC OT "6 Clicks" Daily Activity     Outcome Measure   Help from another person eating meals?: A Little Help from another person taking care of personal grooming?: A Lot Help from another person toileting, which includes using toliet, bedpan, or urinal?: Total Help from another person bathing (including washing, rinsing, drying)?:  A Lot Help from another person to put on and taking off regular upper body clothing?: A Lot Help from another person to put on and taking off regular lower body clothing?: Total 6 Click Score: 11    End of Session Equipment Utilized During Treatment: Other (comment) Charlaine Dalton)  OT Visit Diagnosis: Unsteadiness on feet (R26.81);Other symptoms and signs involving cognitive function;Pain;History of falling (Z91.81);Repeated falls (R29.6);Muscle weakness (generalized) (M62.81)   Activity Tolerance Patient tolerated treatment well;Patient  limited by fatigue   Patient Left in chair;with call bell/phone within reach;with chair alarm set;with family/visitor present   Nurse Communication Mobility status;Need for lift equipment        Time: 1255-1317 OT Time Calculation (min): 22 min  Charges: OT General Charges $OT Visit: 1 Visit OT Treatments $Self Care/Home Management : 8-22 mins  Tyrone Schimke, OT Acute Rehabilitation Services Office: [phone]   Hortencia Pilar 12/03/2021, 1:50 PM

## 2021-12-03 NOTE — Progress Notes (Signed)
Patient sat in the chair for a total of 3 hours today from 1400 to 1700.

## 2021-12-03 NOTE — Progress Notes (Signed)
16 Days Post-Op  Subjective: CC: Sitting up in bed going through the items in her purse.  Denies pain.  States yesterday was an active day.  Sister is at bedside assisting with history.  Sister feels the patient has improved since TPN was initiated.  Per sister, the patient did not eat much yesterday, drank maybe half of an Ensure.  She did work with physical therapy who stated that she was more participative.  Objective: Vital signs in last 24 hours: Temp:  [98.1 F (36.7 C)-99.4 F (37.4 C)] 98.3 F (36.8 C) (12/13 0358) Pulse Rate:  [70-103] 70 (12/13 0358) Resp:  [18] 18 (12/13 0358) BP: (106-116)/(67-89) 114/71 (12/13 0358) SpO2:  [93 %-98 %] 98 % (12/13 0358) Weight:  [77.6 kg] 77.6 kg (12/13 0500) Last BM Date: 12/01/21  Intake/Output from previous day: 12/12 0701 - 12/13 0700 In: 1528.5 [P.O.:540; I.V.:478.6; IV Piggyback:510] Out: 5916 [Urine:1350; Drains:75; Stool:250] Intake/Output this shift: No intake/output data recorded.  PE: Gen:  Alert, NAD Card:  Reg Pulm:  CTAB, no W/R/R, effort normal Abd: Soft, she reports no tenderness upon palpation of her abdomen.  Stoma pink and viable, semisolid nonbloody stool in ostomy pouch. Mucus fistula present with small amount of mucus in pouch.  Wound VAC to suction. Ext:  MAE's Skin: no rashes noted, warm and dry Neuro: oriented to person, place, time.   Lab Results:  Recent Labs    12/01/21 0456 12/02/21 0959  WBC 9.0 8.2  HGB 9.0* 9.3*  HCT 27.2* 28.5*  PLT 634* 594*   BMET Recent Labs    12/02/21 0959 12/03/21 0439  NA 140 138  K 3.5 3.8  CL 111 108  CO2 23 22  GLUCOSE 119* 132*  BUN 14 13  CREATININE 0.58 0.49  CALCIUM 9.5 9.6   PT/INR No results for input(s): LABPROT, INR in the last 72 hours. CMP     Component Value Date/Time   NA 138 12/03/2021 0439   K 3.8 12/03/2021 0439   CL 108 12/03/2021 0439   CO2 22 12/03/2021 0439   GLUCOSE 132 (H) 12/03/2021 0439   BUN 13 12/03/2021 0439    CREATININE 0.49 12/03/2021 0439   CALCIUM 9.6 12/03/2021 0439   PROT 6.5 12/03/2021 0439   ALBUMIN 2.1 (L) 12/03/2021 0439   AST 17 12/03/2021 0439   ALT 10 12/03/2021 0439   ALKPHOS 79 12/03/2021 0439   BILITOT 0.3 12/03/2021 0439   GFRNONAA >60 12/03/2021 0439   Lipase     Component Value Date/Time   LIPASE 22 11/17/2021 0955    Studies/Results: No results found.  Anti-infectives: Anti-infectives (From admission, onward)    Start     Dose/Rate Route Frequency Ordered Stop   11/25/21 1000  piperacillin-tazobactam (ZOSYN) IVPB 3.375 g  Status:  Discontinued        3.375 g 12.5 mL/hr over 240 Minutes Intravenous Every 8 hours 11/25/21 0909 12/01/21 0822   11/17/21 1830  piperacillin-tazobactam (ZOSYN) IVPB 3.375 g        3.375 g 12.5 mL/hr over 240 Minutes Intravenous Every 8 hours 11/17/21 1415 11/22/21 2359   11/17/21 1230  piperacillin-tazobactam (ZOSYN) IVPB 3.375 g        3.375 g 100 mL/hr over 30 Minutes Intravenous  Once 11/17/21 1220 11/17/21 1304        Assessment/Plan 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 Harlow Asa 11/27 - CT 12/4 w/  trace peripancreatic and right paracolic gutter simple free fluid that extends inferiorly into the right pelvis with associated peritoneal wall enhancement/thickening. No organized drainable fluid collection.  - Restarted abx 12/5. WBC normalized. Abx stopped. - Minimal oral intake. Was on cortrak + TF 12/7-8. Cortrak became clogged and patient refused replacement 12/8. - Ileostomy output improved. Off Imodium. Cont BID metamucil - WOC following for ileostomy, mucous fistula, wound VAC (MWF change) - Mobilize. PT/OT recommend SNF, TOC consulted - Pathology benign. CT distal descending/proximal sigmoid bowel luminal collapse -- radiology recommending colonoscopy as outpatient.    FEN - Regular diet as tolerated, ensure TID, TPN. Encephalopathy  improving, workup has been negative. Continue TPN and calorie count. She needs to mobilize.  At this point, if she gets a bed at a skilled nursing facility, she will need to go on TPN as she is not taking in enough calories to maintain adequate nutrition with p.o. intake alone. VTE - SCDs, Lovenox ID - zosyn 11/27 - 12/2. 12/5-12/11 Foley - External  Encephalopathy/FTT - Alferd Apa called and spoke with the patient's family 12/7, her brother [name].  He reports prior to admission patient was living at home alone, ambulating without assistive devices, driving and doing ADLs.  He reports she has lived down here for 7+ years and has lived independently.  Her closest family member is her sister and brother in [place].  He reports he talks to her 1-2 times per week and does not recall her having any issues with memory that of been documented in the chart here.  The patient has had decreased mobility with PT/OT with generalized weakness and memory issues here. Avoid narcotics as able. TRH consulted and worked this up. Greatly appreciate their assistance.   LOS: 16 days    [name] , [facility] 12/03/2021, 9:27 AM Please see Amion for pager number during day hours 7:00am-4:30pm

## 2021-12-03 NOTE — Progress Notes (Signed)
Nutrition Follow-up  DOCUMENTATION CODES:   Severe malnutrition in context of acute illness/injury  INTERVENTION:   Monitor magnesium, potassium, and phosphorus BID for at least 3 days, MD to replete as needed, as pt is at risk for refeeding syndrome.  -TPN management per Pharmacy -Continue thiamine supplementation  -Ensure Enlive po BID, each supplement provides 350 kcal and 20 grams of protein  -Milk served with every meal tray (pt enjoys 2% -provides 120 kcals and 8g protein per carton)  -Juven, each serving provides 95kcal and 2.5g of protein (amino acids glutamine and arginine)   -D/c Boost Breeze  NUTRITION DIAGNOSIS:   Severe Malnutrition related to acute illness as evidenced by energy intake < or equal to 50% for > or equal to 5 days, moderate fat depletion, severe muscle depletion.  Ongoing.  GOAL:   Patient will meet greater than or equal to 90% of their needs  Progressing with TPN and PO  MONITOR:   PO intake, Supplement acceptance, Labs, Weight trends, I & O's (TPN)  ASSESSMENT:   65 year old female who presents to the emergency department with sudden onset of abdominal pain and distention.CT scan of abdomen and pelvis shows findings consistent with free air with a large pneumoperitoneum.  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  11/27: s/p ex lap, right colectomy, ileostomy 12/11: PICC placed 12/12: TPN initiated  Surgery PA reached out to RD to request follow-up with pt to help determine supplement preferences.  Per Pharmacy note, TPN to increase to 80 ml/hr tonight (providing 2100 kcals, 103g protein).  Patient in room with sister at bedside. Pt more alert and able to speak with RD today. Pt not a good historian, however, as she is very forgetful. Per sister, pt consumed 1 slice of bacon, 1/2 carton of 2% milk and a whole Ensure (total ~ 465 kcals, 26g protein) this morning.  Pt was encouraged to try some of her lunch  which was just delivered. Sister to stay most of the day and she offers encouragement and support for patient. Pt agreed to drink another Ensure today but later after her lunch. She also seemed interested in ice cream with her dinner tonight. If pt can consume 2 Ensures and drink milk with every meal this will provide ~1060 kcals and 64g protein.  Discussed Juven supplement and it's role in aiding wound healing.   Admission weight: 176 lbs. Current weight: 171 lbs  Medications: Multivitamin with minerals daily, Thiamine  Labs reviewed: CBGs: 123-135  Diet Order:   Diet Order             Diet regular Room service appropriate? Yes; Fluid consistency: Thin  Diet effective now                   EDUCATION NEEDS:   Education needs have been addressed  Skin:  Skin Assessment: Skin Integrity Issues: Skin Integrity Issues:: Incisions Incisions: 11/27 abdomen  Last BM:  12/13 -type 7 -ileostomy  Height:   Ht Readings from Last 1 Encounters:  11/17/21 5\' 10"  (1.778 m)    Weight:   Wt Readings from Last 1 Encounters:  12/03/21 77.6 kg    BMI:  Body mass index is 24.55 kg/m.  Estimated Nutritional Needs:   Kcal:  1950-2150  Protein:  95-105g  Fluid:  2L/day  Clayton Bibles, MS, RD, LDN Inpatient Clinical Dietitian Contact information available via Amion

## 2021-12-03 NOTE — Progress Notes (Signed)
PHARMACY - TOTAL PARENTERAL NUTRITION CONSULT NOTE   Indication:  Inability to meet enteral needs  Patient Measurements: Height: 5\' 10"  (177.8 cm) Weight: 77.6 kg (171 lb 1.2 oz) IBW/kg (Calculated) : 68.5 TPN AdjBW (KG): 79.8 Body mass index is 24.55 kg/m. Usual Weight: 80 kg  Assessment: 65 y.o. 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 She is now extended right colectomy and ileostomy. She developed acute metabolic encephalopathy post-op. She was initiated on TF, but tube became clogged on 12/8 and patient refused replacement, and is not eating enough to maintain nutrition.  Calorie count has been ordered.  CCS has ordered TPN, but is ok waiting to start 12/12 pending calorie count and possible improvement in PO intake since she is medically stable for discharge.  Glucose / Insulin: No hx DM. CBGs 120-141 Electrolytes: WNL.  Phos improved after replacement Renal: SCr < 1, BUN WNL Hepatic: WNL; Trig 95 (12/12) Intake / Output: UOP 1350 ml, Stool 240ml, drains 75 mL; Net I/O -146 mL MIVF: none GI Imaging: GI Surgeries / Procedures:  11/27: Expiratory laparotomy, extended right colectomy, end ileostomy  Central access: PICC ordered 12/11 TPN start date: 12/12  Nutritional Goals: Goal TPN rate is 80 mL/hr (provides 105 g of protein and 1977 kcals per day)  RD Assessment: Estimated Needs Total Energy Estimated Needs: 1950-2150 Total Protein Estimated Needs: 95-105g Total Fluid Estimated Needs: 2L/day  Current Nutrition:  Regular diet Boost/Breeze QID ([phone] kcal, 36-40g protein) , Juven BID (190 kcal, 5g protein)   Plan:  At 18:00:  Advance to TPN at 29mL/hr at 1800  Standard electrolytes in TPN:  Na 56mEq/L, K 2mEq/L, Ca 91mEq/L, Mg 56mEq/L, and Phos  70mmol/L.  Cl:Ac 1:2 Patient taking PO multivitamin therefore, will not add to TPN Thiamine 100 mg PO daily given refeeding risk per RD Initiate Sensitive q6h SSI and adjust as needed  IVF per MD - none currently Monitor TPN labs on Mon/Thurs   Gretta Arab PharmD, BCPS Clinical Pharmacist [facility] main pharmacy [phone] 12/03/2021 7:38 AM

## 2021-12-04 LAB — GLUCOSE, CAPILLARY
Glucose-Capillary: 130 mg/dL — ABNORMAL HIGH (ref 70–99)
Glucose-Capillary: 137 mg/dL — ABNORMAL HIGH (ref 70–99)
Glucose-Capillary: 139 mg/dL — ABNORMAL HIGH (ref 70–99)
Glucose-Capillary: 140 mg/dL — ABNORMAL HIGH (ref 70–99)
Glucose-Capillary: 142 mg/dL — ABNORMAL HIGH (ref 70–99)
Glucose-Capillary: 149 mg/dL — ABNORMAL HIGH (ref 70–99)
Glucose-Capillary: 151 mg/dL — ABNORMAL HIGH (ref 70–99)

## 2021-12-04 LAB — BASIC METABOLIC PANEL
Anion gap: 5 (ref 5–15)
BUN: 15 mg/dL (ref 8–23)
CO2: 26 mmol/L (ref 22–32)
Calcium: 9.7 mg/dL (ref 8.9–10.3)
Chloride: 109 mmol/L (ref 98–111)
Creatinine, Ser: 0.38 mg/dL — ABNORMAL LOW (ref 0.44–1.00)
GFR, Estimated: >60 mL/min (ref >60)
Glucose, Bld: 134 mg/dL — ABNORMAL HIGH (ref 70–99)
Potassium: 3.9 mmol/L (ref 3.5–5.1)
Sodium: 140 mmol/L (ref 135–145)

## 2021-12-04 LAB — MAGNESIUM: Magnesium: 2 mg/dL (ref 1.7–2.4)

## 2021-12-04 LAB — PHOSPHORUS: Phosphorus: 2.3 mg/dL — ABNORMAL LOW (ref 2.5–4.6)

## 2021-12-04 MED ORDER — TRAVASOL 10 % IV SOLN
INTRAVENOUS | Status: AC
  Filled 2021-12-04: qty 1036.8

## 2021-12-04 MED ORDER — POTASSIUM PHOSPHATES 15 MMOLE/5ML IV SOLN
30.0000 mmol | Freq: Once | INTRAVENOUS | Status: AC
  Administered 2021-12-04: 09:00:00 30 mmol via INTRAVENOUS
  Filled 2021-12-04: qty 10

## 2021-12-04 NOTE — NC FL2 (Signed)
[place] LEVEL OF CARE SCREENING TOOL     IDENTIFICATION  Patient Name: Sherry Hampton Birthdate: 11/04/1956 Sex: female Admission Date (Current Location): 11/17/2021  [facility] [place] and [place] Number:  Herbalist and Address:  [facility],  [place] [address] [place]      Provider Number: [id number]  Attending Physician Name and Address:  Edison Pace, Md, MD  Relative Name and Phone Number:       Current Level of Care: Hospital Recommended Level of Care: [place] Prior Approval Number:    Date Approved/Denied:   PASRR Number: [id number]  Discharge Plan: SNF    Current Diagnoses: Patient Active Problem List   Diagnosis Date Noted   Protein-calorie malnutrition, severe 11/26/2021   GERD (gastroesophageal reflux disease)    Hyperlipidemia    Hyponatremia    Acute blood loss anemia    Acute encephalopathy    Perforated abdominal viscus 11/17/2021   Perforated viscus 11/17/2021    Orientation RESPIRATION BLADDER Height & Weight     Self, Time, Situation, Place  Normal Continent, External catheter Weight: 171 lb 1.2 oz (77.6 kg) Height:  5\' 10"  (177.8 cm)  BEHAVIORAL SYMPTOMS/MOOD NEUROLOGICAL BOWEL NUTRITION STATUS      Colostomy TNA  AMBULATORY STATUS COMMUNICATION OF NEEDS Skin   Extensive Assist Verbally Surgical wounds                       Personal Care Assistance Level of Assistance  Bathing, Dressing Bathing Assistance: Limited assistance   Dressing Assistance: Limited assistance     Functional Limitations Info             [place]  PT (By licensed PT), OT (By licensed OT)     PT Frequency: 5x/wk OT Frequency: 5x/wk            Contractures Contractures Info: Not present    Additional Factors Info  Code Status, Allergies Code Status Info: Full Allergies Info: NKDA           Current Medications (12/04/2021):  This is the current hospital active  medication list Current Facility-Administered Medications  Medication Dose Route Frequency Provider Last Rate Last Admin   0.9 %  sodium chloride infusion   Intravenous PRN [name], MD 10 mL/hr at 11/22/21 0434 New Bag at 11/22/21 0434   acetaminophen (TYLENOL) tablet 1,000 mg  1,000 mg Oral Q6H Saverio Danker, PA-C   1,000 mg at 12/04/21 0516   chlorhexidine (PERIDEX) 0.12 % solution 15 mL  15 mL Mouth Rinse BID [name], MD   15 mL at 12/04/21 0950   Chlorhexidine Gluconate Cloth 2 % PADS 6 each  6 each Topical Daily Eugenie Filler, MD   6 each at 12/04/21 0943   enoxaparin (LOVENOX) injection 40 mg  40 mg Subcutaneous Q24H [name], MD   40 mg at 12/04/21 0950   feeding supplement (ENSURE ENLIVE / ENSURE PLUS) liquid 237 mL  237 mL Oral BID BM Cornett, Marcello Moores, MD   237 mL at 23/53/61 4431   folic acid (FOLVITE) tablet 1 mg  1 mg Oral Daily Shade, Christine E, RPH   1 mg at 12/04/21 0950   MEDLINE mouth rinse  15 mL Mouth Rinse q12n4p [name], MD   15 mL at 12/03/21 1518   multivitamin with minerals tablet 1 tablet  1 tablet Oral Daily Randa Spike, RPH   1 tablet at 12/04/21 [phone]  nutrition supplement (JUVEN) (JUVEN) powder packet 1 packet  1 packet Oral BID BM Shade, Haze Justin, RPH   1 packet at 12/03/21 1357   ondansetron (ZOFRAN-ODT) disintegrating tablet 4 mg  4 mg Oral Q6H PRN [name], MD       Or   ondansetron (ZOFRAN) injection 4 mg  4 mg Intravenous Q6H PRN [name], MD       oxyCODONE (Oxy IR/ROXICODONE) immediate release tablet 2.5 mg  2.5 mg Oral Q8H PRN Romana Juniper A, MD   2.5 mg at 12/04/21 0851   pantoprazole (PROTONIX) EC tablet 40 mg  40 mg Oral QHS Adrian Saran, RPH   40 mg at 12/03/21 2105   potassium PHOSPHATE 30 mmol in dextrose 5 % 500 mL infusion  30 mmol Intravenous Once Emiliano Dyer, RPH 85 mL/hr at 12/04/21 0856 30 mmol at 12/04/21 0856   psyllium (HYDROCIL/METAMUCIL) 1 packet  1 packet Oral BID Ileana Roup,  MD   1 packet at 12/03/21 2104   sodium chloride flush (NS) 0.9 % injection 10-40 mL  10-40 mL Intracatheter Q12H Eugenie Filler, MD   10 mL at 12/01/21 2240   sodium chloride flush (NS) 0.9 % injection 10-40 mL  10-40 mL Intracatheter PRN Eugenie Filler, MD       thiamine tablet 100 mg  100 mg Oral Daily Randa Spike, RPH   100 mg at 12/04/21 0950   TPN ADULT (ION)   Intravenous Continuous TPN Randa Spike, RPH 80 mL/hr at 12/03/21 1733 New Bag at 12/03/21 1733   TPN ADULT (ION)   Intravenous Continuous TPN Emiliano Dyer, [facility]         Discharge Medications: Please see discharge summary for a list of discharge medications.  Relevant Imaging Results:  Relevant Lab Results:   Additional Information [phone]  Lennart Pall, LCSW

## 2021-12-04 NOTE — TOC Progression Note (Signed)
Transition of Care [facility]) - Progression Note    Patient Details  Name: Sherry Hampton MRN: [id number] Date of Birth: November 05, 1956  [facility] of [facility] CM/SW Contact  Lennart Pall, [place] Phone Number: 12/04/2021, 11:08 AM  Clinical Narrative:    Pt now with need for ongoing TPN and has had removal of wound VAC.  FL2 updated and resent out to all area SNFs.  Continue to await bed offer. Per discussion/ review by LTAC contacts, pt does not meet criteria for LTAC level of care.   Expected Discharge Plan: [place] Barriers to Discharge: Continued Medical Work up, Ship broker, SNF Pending bed offer  Expected Discharge Plan and Services Expected Discharge Plan: [place] In-house Referral: Clinical Social Work     Living arrangements for the past 2 months: Single Family Home                                       Social Determinants of Health (SDOH) Interventions    Readmission Risk Interventions No flowsheet data found.

## 2021-12-04 NOTE — Progress Notes (Signed)
17 Days Post-Op  Subjective: CC: NAEO. Ate a little better yesterday and sat up in the chair for 3 hours. Pain controlled.   Objective: Vital signs in last 24 hours: Temp:  [97.4 F (36.3 C)-99.7 F (37.6 C)] 98.2 F (36.8 C) (12/14 0553) Pulse Rate:  [74-109] 74 (12/14 0553) Resp:  [17-20] 17 (12/14 0553) BP: (106-115)/(55-67) 115/57 (12/14 0553) SpO2:  [96 %-99 %] 97 % (12/14 0553) Last BM Date: 12/04/21  Intake/Output from previous day: 12/13 0701 - 12/14 0700 In: 2029.4 [P.O.:580; I.V.:1449.4] Out: 1850 [Urine:1500; Drains:50; Stool:300] Intake/Output this shift: Total I/O In: 40 [P.O.:40] Out: 125 [Stool:125]  PE: Gen:  Alert, NAD Card:  Reg Pulm:  CTAB, no W/R/R, effort normal Abd: Soft, appropraitely tender  Midline incision as below: >80% granulation tissue, there is a tunnel at superior and inferior most aspect of the wound with fibrunous exudate at base. No bleeding. No purulence. WOC RN reported seeing air bubbles at the inferior-most tunnel upon removing the VAC.  Mucous fistula patent and productive, covered with slough/necrotic tissue:    Ext:  MAE's Skin: no rashes noted, warm and dry Neuro: oriented to person, place, time.   Lab Results:  Recent Labs    12/02/21 0959  WBC 8.2  HGB 9.3*  HCT 28.5*  PLT 594*   BMET Recent Labs    12/03/21 0439 12/04/21 0509  NA 138 140  K 3.8 3.9  CL 108 109  CO2 22 26  GLUCOSE 132* 134*  BUN 13 15  CREATININE 0.49 0.38*  CALCIUM 9.6 9.7   PT/INR No results for input(s): LABPROT, INR in the last 72 hours. CMP     Component Value Date/Time   NA 140 12/04/2021 0509   K 3.9 12/04/2021 0509   CL 109 12/04/2021 0509   CO2 26 12/04/2021 0509   GLUCOSE 134 (H) 12/04/2021 0509   BUN 15 12/04/2021 0509   CREATININE 0.38 (L) 12/04/2021 0509   CALCIUM 9.7 12/04/2021 0509   PROT 6.5 12/03/2021 0439   ALBUMIN 2.1 (L) 12/03/2021 0439   AST 17 12/03/2021 0439   ALT 10 12/03/2021 0439   ALKPHOS 79  12/03/2021 0439   BILITOT 0.3 12/03/2021 0439   GFRNONAA >60 12/04/2021 0509   Lipase     Component Value Date/Time   LIPASE 22 11/17/2021 0955    Studies/Results: No results found.  Anti-infectives: Anti-infectives (From admission, onward)    Start     Dose/Rate Route Frequency Ordered Stop   11/25/21 1000  piperacillin-tazobactam (ZOSYN) IVPB 3.375 g  Status:  Discontinued        3.375 g 12.5 mL/hr over 240 Minutes Intravenous Every 8 hours 11/25/21 0909 12/01/21 0822   11/17/21 1830  piperacillin-tazobactam (ZOSYN) IVPB 3.375 g        3.375 g 12.5 mL/hr over 240 Minutes Intravenous Every 8 hours 11/17/21 1415 11/22/21 2359   11/17/21 1230  piperacillin-tazobactam (ZOSYN) IVPB 3.375 g        3.375 g 100 mL/hr over 30 Minutes Intravenous  Once 11/17/21 1220 11/17/21 1304        Assessment/Plan 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 Harlow Asa 11/27 - CT 12/4 w/ trace peripancreatic and right paracolic gutter simple free fluid that extends inferiorly into the right pelvis with associated peritoneal wall enhancement/thickening. No organized drainable fluid collection.  - Restarted abx 12/5. WBC normalized. Abx stopped. - Minimal oral intake.  Was on cortrak + TF 12/7-8. Cortrak became clogged and patient refused replacement 12/8. - Ileostomy output improved. Off Imodium. Cont BID metamucil - WOC following for ileostomy, mucous fistula; d/c wound vac and start wet to dry given air bubbles noted in wound bed. Monitor. - Mobilize. PT/OT recommend SNF, TOC consulted - Pathology benign. CT distal descending/proximal sigmoid bowel luminal collapse -- radiology recommending colonoscopy as outpatient.    FEN - Regular diet as tolerated, ensure TID, TPN. Encephalopathy improving, workup has been negative. Continue TPN and calorie count. She needs to mobilize.  At this point, if she gets a bed at a skilled  nursing facility, she will need to go on TPN as she is not taking in enough calories to maintain adequate nutrition with p.o. intake alone. VTE - SCDs, Lovenox ID - zosyn 11/27 - 12/2. 12/5-12/11 Foley - External  Encephalopathy/FTT - Alferd Apa called and spoke with the patient's family 12/7, her brother [name].  He reports prior to admission patient was living at home alone, ambulating without assistive devices, driving and doing ADLs.  He reports she has lived down here for 7+ years and has lived independently.  Her closest family member is her sister and brother in [place].  He reports he talks to her 1-2 times per week and does not recall her having any issues with memory that of been documented in the chart here.  The patient has had decreased mobility with PT/OT with generalized weakness and memory issues here. Avoid narcotics as able. TRH consulted and worked this up. Greatly appreciate their assistance.   LOS: 69 days    [name] , [facility] 12/04/2021, 10:40 AM Please see Amion for pager number during day hours 7:00am-4:30pm

## 2021-12-04 NOTE — Progress Notes (Signed)
PHARMACY - TOTAL PARENTERAL NUTRITION CONSULT NOTE   Indication:  Inability to meet enteral needs  Patient Measurements: Height: 5\' 10"  (177.8 cm) Weight: 77.6 kg (171 lb 1.2 oz) IBW/kg (Calculated) : 68.5 TPN AdjBW (KG): 79.8 Body mass index is 24.55 kg/m. Usual Weight: 80 kg  Assessment: 65 y.o. 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 She is now extended right colectomy and ileostomy. She developed acute metabolic encephalopathy post-op. She was initiated on TF, but tube became clogged on 12/8 and patient refused replacement, and is not eating enough to maintain nutrition.  Calorie count has been ordered.  CCS has ordered TPN, but is ok waiting to start 12/12 pending calorie count and possible improvement in PO intake since she is medically stable for discharge.  Glucose / Insulin: No hx DM. CBGs 137-143, no SSI required Electrolytes: Phos slightly low, required replacement 12/12 Renal: SCr < 1, BUN WNL Hepatic: WNL; Trig 95 (12/12) Intake / Output: UOP 1500 ml, Stool 370ml, drains 50 mL; Net I/O -179 mL MIVF: none GI Imaging: GI Surgeries / Procedures:  11/27: Expiratory laparotomy, extended right colectomy, end ileostomy  Central access: PICC placed 12/11 TPN start date: 12/12  Nutritional Goals: Goal TPN rate is 80 mL/hr (provides 105 g of protein and 1977 kcals per day)  RD Assessment: Estimated Needs Total Energy Estimated Needs: 1950-2150 Total Protein Estimated Needs: 95-105g Total Fluid Estimated Needs: 2L/day  Current Nutrition:  Regular diet Boost/Breeze QID ([phone] kcal, 36-40g protein) , Juven BID (190 kcal, 5g protein)   Plan:  Now: Potassium phosphate 48mMol IV x 1  At 18:00:  Continue TPN at 1mL/hr  Electrolytes in TPN:   Na 32mEq/L,   K 53mEq/L,  Ca 55mEq/L,  Mg 83mEq/L,  Phos 39mmol/L.  Cl:Ac 1:2 Patient taking PO multivitamin therefore, will not add to TPN Thiamine 100 mg PO daily given refeeding risk per RD Initiate Sensitive q6h SSI and adjust as needed  IVF per MD - none currently Monitor TPN labs on Mon/Thurs   Peggyann Juba, PharmD, BCPS Clinical Pharmacist [facility] main pharmacy [phone] 12/04/2021 7:32 AM

## 2021-12-04 NOTE — Consult Note (Signed)
[place] Nurse wound follow up Patient receiving care in [place]. Patient's sister in room throughout care provided. E. Simaan, PA-C also at bedside. Black foam removed from abdominal wound bed. Decision made by PA to discontinue VAC and initiate BID saline moistened gauze dressings to site. I provided this care to the wound today.  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 Stoma to MF sloughing off. Measures 1 1/8 inches. Stomal assessment/size: RLQ ileostomy size remains almost 1 1/4 inches, round, moist, sutures intact, pink, moist, productive of thin brown effluent. Peristomal assessment: intact for both Treatment options for stomal/peristomal skin: barrier ring Output as above Ostomy pouching: 2pc. 2 and 1/4 pouching system for both stomas. Supplies available in Clean supply room. Education provided: Patient to be discharged to a SNF or Rehab center. Enrolled patient in [place] Discharge program: Yes, previously.  WOC will see weekly. Next visit--next week. Val Riles, RN, MSN, CWOCN, CNS-BC, pager [phone]

## 2021-12-05 LAB — GLUCOSE, CAPILLARY
Glucose-Capillary: 141 mg/dL — ABNORMAL HIGH (ref 70–99)
Glucose-Capillary: 142 mg/dL — ABNORMAL HIGH (ref 70–99)
Glucose-Capillary: 149 mg/dL — ABNORMAL HIGH (ref 70–99)
Glucose-Capillary: 172 mg/dL — ABNORMAL HIGH (ref 70–99)
Glucose-Capillary: 135 mg/dL — ABNORMAL HIGH (ref 70–99)

## 2021-12-05 LAB — COMPREHENSIVE METABOLIC PANEL
ALT: 11 U/L (ref 0–44)
AST: 19 U/L (ref 15–41)
Albumin: 2.2 g/dL — ABNORMAL LOW (ref 3.5–5.0)
Alkaline Phosphatase: 84 U/L (ref 38–126)
Anion gap: 6 (ref 5–15)
BUN: 17 mg/dL (ref 8–23)
CO2: 26 mmol/L (ref 22–32)
Calcium: 10.4 mg/dL — ABNORMAL HIGH (ref 8.9–10.3)
Chloride: 106 mmol/L (ref 98–111)
Creatinine, Ser: 0.43 mg/dL — ABNORMAL LOW (ref 0.44–1.00)
GFR, Estimated: >60 mL/min (ref >60)
Glucose, Bld: 128 mg/dL — ABNORMAL HIGH (ref 70–99)
Potassium: 3.9 mmol/L (ref 3.5–5.1)
Sodium: 138 mmol/L (ref 135–145)
Total Bilirubin: 0.3 mg/dL (ref 0.3–1.2)
Total Protein: 7.1 g/dL (ref 6.5–8.1)

## 2021-12-05 LAB — MAGNESIUM: Magnesium: 2.1 mg/dL (ref 1.7–2.4)

## 2021-12-05 LAB — PHOSPHORUS: Phosphorus: 2.7 mg/dL (ref 2.5–4.6)

## 2021-12-05 MED ORDER — TRAVASOL 10 % IV SOLN
INTRAVENOUS | Status: AC
  Filled 2021-12-05: qty 518.4

## 2021-12-05 MED ORDER — TRAVASOL 10 % IV SOLN
INTRAVENOUS | Status: DC
  Filled 2021-12-05: qty 1036.8

## 2021-12-05 MED ORDER — LIP MEDEX EX OINT
1.0000 "application " | TOPICAL_OINTMENT | CUTANEOUS | Status: DC | PRN
  Administered 2021-12-27: 1 via TOPICAL
  Filled 2021-12-05 (×4): qty 7

## 2021-12-05 NOTE — Progress Notes (Addendum)
PHARMACY - TOTAL PARENTERAL NUTRITION CONSULT NOTE   Indication:  Inability to meet enteral needs  Patient Measurements: Height: 5\' 10"  (177.8 cm) Weight: 77.6 kg (171 lb 1.2 oz) IBW/kg (Calculated) : 68.5 TPN AdjBW (KG): 79.8 Body mass index is 24.55 kg/m. Usual Weight: 80 kg  Assessment: 65 y.o. 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 She is now extended right colectomy and ileostomy. She developed acute metabolic encephalopathy post-op. She was initiated on TF, but tube became clogged on 12/8 and patient refused replacement, and is not eating enough to maintain nutrition.  Calorie count has been ordered.  CCS has ordered TPN, but is ok waiting to start 12/12 pending calorie count and possible improvement in PO intake since she is medically stable for discharge.  Glucose / Insulin: No hx DM. CBGs 139-151, no SSI required Electrolytes: Phos improved after replacement 12/12 & 12/14, Ca elevated Renal: SCr < 1, BUN WNL Hepatic: WNL;  Trig: 95 (12/12) Intake / Output: UOP 2400 ml, Stool 336ml, drains no output recorded; Net I/O -1677 mL MIVF: none GI Imaging: GI Surgeries / Procedures:  11/27: Expiratory laparotomy, extended right colectomy, end ileostomy  Central access: PICC placed 12/11 TPN start date: 12/12  Nutritional Goals: Goal TPN rate is 80 mL/hr (provides 105 g of protein and 1977 kcals per day)  RD Assessment: Estimated Needs Total Energy Estimated Needs: 1950-2150 Total Protein Estimated Needs: 95-105g Total Fluid Estimated Needs: 2L/day  Current Nutrition:  Regular diet Ensure Enlive po BID (350 kcal, 20 g protein) Juven BID (190 kcal, 5g protein)   Plan:   At 18:00:  Continue TPN at 20mL/hr  Electrolytes in TPN:   Na 56mEq/L,  K 95mEq/L,   Ca 60mEq/L (remove),  Mg 21mEq/L,  Phos 47mmol/L.  Cl:Ac 1:2 Patient taking PO multivitamin therefore, will not add to TPN Thiamine 100 mg PO daily given refeeding risk per RD Sensitive q6h SSI and adjust as needed  IVF per MD - none currently Monitor TPN labs on Mon/Thurs   Peggyann Juba, PharmD, BCPS Clinical Pharmacist [facility] main pharmacy [phone] 12/05/2021 7:17 AM  Addendum: per discussion with CCS, will decrease TPN rate by 1/2 to help stimulate patient's appetite.  Peggyann Juba, PharmD, BCPS 12/05/2021 9:29 AM

## 2021-12-05 NOTE — TOC Progression Note (Signed)
Transition of Care [facility]) - Progression Note    Patient Details  Name: Sherry Hampton MRN: [id number] Date of Birth: June 12, 1956  Transition of Care [facility]) CM/SW [place], [place] Phone Number: 12/05/2021, 3:18 PM  Clinical Narrative:   Spoke with brother who wants patient transferred to [place].  I explained that she does not qualify for LTACH due to not having a 3 night ICU stay.  He voiced understanding, but also stated that he wanted the number so he could call them.  Number given. TOC will continue to follow during the course of hospitalization.     Expected Discharge Plan: [place] Barriers to Discharge: Continued Medical Work up, Ship broker, SNF Pending bed offer  Expected Discharge Plan and Services Expected Discharge Plan: [place] In-house Referral: Clinical Social Work     Living arrangements for the past 2 months: Single Family Home                                       Social Determinants of Health (SDOH) Interventions    Readmission Risk Interventions No flowsheet data found.

## 2021-12-05 NOTE — Progress Notes (Signed)
18 Days Post-Op  Subjective: CC: NAEO. Drink milk for breakfast and I just handed her a strawberry ensure. Per sister she had at least one milk yesterday, one ensure, and a bite of [place] at dinner.   Objective: Vital signs in last 24 hours: Temp:  [97.3 F (36.3 C)-98.2 F (36.8 C)] 98.2 F (36.8 C) (12/15 0519) Pulse Rate:  [81-94] 81 (12/15 0519) Resp:  [16-18] 16 (12/15 0519) BP: (95-130)/(73-81) 130/81 (12/15 0519) SpO2:  [97 %-99 %] 97 % (12/15 0519) Last BM Date: 12/04/21  Intake/Output from previous day: 12/14 0701 - 12/15 0700 In: 1047.2 [P.O.:40; I.V.:1007.2] Out: 2725 [Urine:2400; Stool:325] Intake/Output this shift: No intake/output data recorded.  PE: Gen:  Alert, NAD Card:  Reg Pulm:  CTAB, no W/R/R, effort normal Abd: Soft, overall nontender, midline dressing c/d/I, ostomy with gas and stool, mucous fistula with some tan mucous in pouch.    Lab Results:  Recent Labs    12/02/21 0959  WBC 8.2  HGB 9.3*  HCT 28.5*  PLT 594*   BMET Recent Labs    12/04/21 0509 12/05/21 0311  NA 140 138  K 3.9 3.9  CL 109 106  CO2 26 26  GLUCOSE 134* 128*  BUN 15 17  CREATININE 0.38* 0.43*  CALCIUM 9.7 10.4*   PT/INR No results for input(s): LABPROT, INR in the last 72 hours. CMP     Component Value Date/Time   NA 138 12/05/2021 0311   K 3.9 12/05/2021 0311   CL 106 12/05/2021 0311   CO2 26 12/05/2021 0311   GLUCOSE 128 (H) 12/05/2021 0311   BUN 17 12/05/2021 0311   CREATININE 0.43 (L) 12/05/2021 0311   CALCIUM 10.4 (H) 12/05/2021 0311   PROT 7.1 12/05/2021 0311   ALBUMIN 2.2 (L) 12/05/2021 0311   AST 19 12/05/2021 0311   ALT 11 12/05/2021 0311   ALKPHOS 84 12/05/2021 0311   BILITOT 0.3 12/05/2021 0311   GFRNONAA >60 12/05/2021 0311   Lipase     Component Value Date/Time   LIPASE 22 11/17/2021 0955    Studies/Results: No results found.  Anti-infectives: Anti-infectives (From admission, onward)    Start     Dose/Rate Route  Frequency Ordered Stop   11/25/21 1000  piperacillin-tazobactam (ZOSYN) IVPB 3.375 g  Status:  Discontinued        3.375 g 12.5 mL/hr over 240 Minutes Intravenous Every 8 hours 11/25/21 0909 12/01/21 0822   11/17/21 1830  piperacillin-tazobactam (ZOSYN) IVPB 3.375 g        3.375 g 12.5 mL/hr over 240 Minutes Intravenous Every 8 hours 11/17/21 1415 11/22/21 2359   11/17/21 1230  piperacillin-tazobactam (ZOSYN) IVPB 3.375 g        3.375 g 100 mL/hr over 30 Minutes Intravenous  Once 11/17/21 1220 11/17/21 1304        Assessment/Plan 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 Harlow Hampton 11/27 - CT 12/4 w/ trace peripancreatic and right paracolic gutter simple free fluid that extends inferiorly into the right pelvis with associated peritoneal wall enhancement/thickening. No organized drainable fluid collection.  - Restarted abx 12/5. WBC normalized. Abx stopped. - Minimal oral intake. Was on cortrak + TF 12/7-8. Cortrak became clogged and patient refused replacement 12/8. - Ileostomy output improved. Off Imodium. Cont BID metamucil - WOC following for ileostomy, mucous fistula; d/c wound vac 12/14. and start wet to dry given air bubbles noted in wound bed.  Monitor. - Mobilize. PT/OT recommend SNF, TOC consulted - Pathology benign. CT distal descending/proximal sigmoid bowel luminal collapse -- radiology recommending colonoscopy as outpatient.    FEN - Regular diet as tolerated, ensure TID, TPN. Encephalopathy improving, workup has been negative. Continue TPN and calorie count. Decrease TPN to 1/2 support tonight to see if it helps her appetite continue to improve. Appreciate RD updating supplement recs 12/143. She needs to mobilize - D/C purewick and OOB to bedside commode for uriantion.  At this point, if she gets a bed at a skilled nursing facility, she will need to go on TPN as she is not taking in enough calories  to maintain adequate nutrition with p.o. intake alone. VTE - SCDs, Lovenox ID - zosyn 11/27 - 12/2. 12/5-12/11 Foley - External  Encephalopathy/FTT - Sherry Hampton called and spoke with the patient's family 12/7, her brother Sherry Hampton.  He reports prior to admission patient was living at home alone, ambulating without assistive devices, driving and doing ADLs.  He reports she has lived down here for 7+ years and has lived independently.  Her closest family member is her sister and brother in [place].  He reports he talks to her 1-2 times per week and does not recall her having any issues with memory that of been documented in the chart here.  The patient has had decreased mobility with PT/OT with generalized weakness and memory issues here. Avoid narcotics as able. TRH consulted and worked this up. Greatly appreciate their assistance.   LOS: 18 days    Sherry Hampton , [facility] 12/05/2021, 9:23 AM Please see Amion for pager number during day hours 7:00am-4:30pm

## 2021-12-05 NOTE — Progress Notes (Signed)
Physical Therapy Treatment Patient Details Name: Sherry Hampton MRN: [id number] DOB: Feb 15, 1956 Today's Date: 12/05/2021   History of Present Illness Patient is 65 y.o. 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 Harlow Asa 11/17/21.  Pt with post op acute metabolic encephalpathy.  Per MD note, " The surgical team has talked to family who has stated this is not her baseline and comparing that she has never been diagnosed with any dementia or memory issues"  PMH significant for anemia, OA, GERD, HLD.    PT Comments    Patient reporting fatigue after sitting up in recliner this AM but agreeable to complete exercises for LE strengthening. Focus on functional exercise with sit<>stands via Stedy. Pt has significant posterior lean in sitting EOB requiring Max assist to stabilize. Balance improved when pt reached for crossbar of Stedy and pt able to rise to stand from elevated surface with min assist +2 for safety. Pt maintained anterior trunk lean once returned to sitting EOB for <1 minutes and drifted back to posterior lean. EOS returned to bed and placed in chair position. Acute PT will continue to follow and progress pt as able.     Recommendations for follow up therapy are one component of a multi-disciplinary discharge planning process, led by the attending physician.  Recommendations may be updated based on patient status, additional functional criteria and insurance authorization.  Follow Up Recommendations  Skilled nursing-short term rehab (<3 hours/day)     Assistance Recommended at Discharge Frequent or constant Supervision/Assistance  Equipment Recommendations  None recommended by PT    Recommendations for Other Services OT consult     Precautions / Restrictions Precautions Precautions: Fall Precaution Comments: abdominal surgery w/ wound vac, Rt/Lt colostomy, tube feed Restrictions Weight Bearing  Restrictions: No     Mobility  Bed Mobility Overal bed mobility: Needs Assistance Bed Mobility: Supine to Sit Rolling: Mod assist;+2 for physical assistance;+2 for safety/equipment Sidelying to sit: Max assist;+2 for physical assistance;HOB elevated   Sit to supine: Max assist;+2 for safety/equipment;+2 for physical assistance   General bed mobility comments: pt with difficulty sequencing and repeated verbal/tactile cues needed. Physical asssit to flex knees and initiate roll to Rt side, assist to bring LE's off EOB as pt not kicking feet forward. Max +2 to press up trunk. Significant posterior lean in sitting EOB.    Transfers Overall transfer level: Needs assistance Equipment used: Ambulation equipment used Transfers: Sit to/from Stand Sit to Stand: Min assist;+2 physical assistance;From elevated surface           General transfer comment: Cues for hand placement and keep grip on Stedy crossbar, Min assist to come to stand and facilitate anterior hip shift for stedy paddles to drop behind pt. 5x from paddles and then pt returned to EOB. pt's seated balance at EOB improved after [place] of Stedy. Transfer via Lift Equipment: Stedy  Ambulation/Gait                   Stairs             Wheelchair Mobility    Modified Rankin (Stroke Patients Only)       Balance Overall balance assessment: Needs assistance Sitting-balance support: Feet supported;Bilateral upper extremity supported Sitting balance-Leahy Scale: Poor Sitting balance - Comments: posterior lean, BUE and up to min A for static sitting. Postural control: Posterior lean Standing balance support: Reliant on assistive device for balance;During functional activity;Bilateral upper extremity  supported Standing balance-Leahy Scale: Poor Standing balance comment: Stedy frame and up to min A. Stood 5-15 seconds                            Cognition Arousal/Alertness: Awake/alert Behavior During  Therapy: WFL for tasks assessed/performed;Anxious Overall Cognitive Status: Impaired/Different from baseline Area of Impairment: Orientation;Attention;Memory;Following commands;Safety/judgement;Awareness;Problem solving                 Orientation Level: Disoriented to;Place;Time;Situation Current Attention Level: Focused Memory: Decreased recall of precautions;Decreased short-term memory Following Commands: Follows one step commands inconsistently Safety/Judgement: Decreased awareness of safety;Decreased awareness of deficits Awareness: Intellectual Problem Solving: Decreased initiation;Slow processing;Difficulty sequencing;Requires verbal cues;Requires tactile cues General Comments: Pt still with altered cognitive status, but somewhat aware of situation        Exercises      General Comments        Pertinent Vitals/Pain Pain Assessment: Faces Faces Pain Scale: Hurts little more Pain Location: abdomen Pain Descriptors / Indicators: Discomfort Pain Intervention(s): Monitored during session;Repositioned;Limited activity within patient's tolerance    Home Living                          Prior Function            PT Goals (current goals can now be found in the care plan section) Acute Rehab PT Goals PT Goal Formulation: With patient Time For Goal Achievement: 12/16/21 Potential to Achieve Goals: Good Progress towards PT goals: Progressing toward goals    Frequency    Min 2X/week      PT Plan Current plan remains appropriate    Co-evaluation              AM-PAC PT "6 Clicks" Mobility   Outcome Measure  Help needed turning from your back to your side while in a flat bed without using bedrails?: Total Help needed moving from lying on your back to sitting on the side of a flat bed without using bedrails?: Total Help needed moving to and from a bed to a chair (including a wheelchair)?: Total Help needed standing up from a chair using your  arms (e.g., wheelchair or bedside chair)?: Total Help needed to walk in hospital room?: Total Help needed climbing 3-5 steps with a railing? : Total 6 Click Score: 6    End of Session   Activity Tolerance: Patient tolerated treatment well;Patient limited by fatigue Patient left: in bed;with call bell/phone within reach;with family/visitor present;with nursing/sitter in room (chair position) Nurse Communication: Mobility status PT Visit Diagnosis: Muscle weakness (generalized) (M62.81);Difficulty in walking, not elsewhere classified (R26.2)     Time: 8185-6314 PT Time Calculation (min) (ACUTE ONLY): 34 min  Charges:  $Therapeutic Exercise: 8-22 mins $Therapeutic Activity: 8-22 mins                     Verner Mould, DPT Acute Rehabilitation Services Office [phone] Pager [phone]    Jacques Navy 12/05/2021, 1:43 PM

## 2021-12-06 LAB — GLUCOSE, CAPILLARY
Glucose-Capillary: 121 mg/dL — ABNORMAL HIGH (ref 70–99)
Glucose-Capillary: 122 mg/dL — ABNORMAL HIGH (ref 70–99)
Glucose-Capillary: 137 mg/dL — ABNORMAL HIGH (ref 70–99)

## 2021-12-06 LAB — COMPREHENSIVE METABOLIC PANEL
ALT: 13 U/L (ref 0–44)
AST: 20 U/L (ref 15–41)
Albumin: 2.3 g/dL — ABNORMAL LOW (ref 3.5–5.0)
Alkaline Phosphatase: 96 U/L (ref 38–126)
Anion gap: 5 (ref 5–15)
BUN: 25 mg/dL — ABNORMAL HIGH (ref 8–23)
CO2: 27 mmol/L (ref 22–32)
Calcium: 11.1 mg/dL — ABNORMAL HIGH (ref 8.9–10.3)
Chloride: 105 mmol/L (ref 98–111)
Creatinine, Ser: 0.45 mg/dL (ref 0.44–1.00)
GFR, Estimated: >60 mL/min (ref >60)
Glucose, Bld: 131 mg/dL — ABNORMAL HIGH (ref 70–99)
Potassium: 4.4 mmol/L (ref 3.5–5.1)
Sodium: 137 mmol/L (ref 135–145)
Total Bilirubin: 0.4 mg/dL (ref 0.3–1.2)
Total Protein: 7.7 g/dL (ref 6.5–8.1)

## 2021-12-06 LAB — PHOSPHORUS: Phosphorus: 2.9 mg/dL (ref 2.5–4.6)

## 2021-12-06 MED ORDER — TRAVASOL 10 % IV SOLN
INTRAVENOUS | Status: AC
  Filled 2021-12-06: qty 518.4

## 2021-12-06 NOTE — Progress Notes (Signed)
19 Days Post-Op  Subjective: CC: NAEO. Oriented to person, place, situation this morning. Reports the year as 2025. Eating breakfast - 100% of her milk, 1 piece bacon, and picking at her potatoes. Sister at bedside.   Objective: Vital signs in last 24 hours: Temp:  [98.7 F (37.1 C)-99 F (37.2 C)] 98.7 F (37.1 C) (12/15 2136) Pulse Rate:  [90-103] 90 (12/15 2136) Resp:  [16-22] 16 (12/15 2136) BP: (119)/(72-74) 119/74 (12/15 2136) SpO2:  [97 %-99 %] 97 % (12/15 2136) Weight:  [76.1 kg] 76.1 kg (12/16 0500) Last BM Date: 12/05/21  Intake/Output from previous day: 12/15 0701 - 12/16 0700 In: 1363.2 [P.O.:220; I.V.:1143.2] Out: 1600 [Urine:1200; Stool:400] Intake/Output this shift: No intake/output data recorded.  PE: Gen:  Alert, NAD Card:  Reg Pulm:  CTAB, no W/R/R, effort normal Abd: Soft, overall nontender, midline dressing c/d/I, ostomy with gas and stool, mucous fistula with some tan mucous in pouch.    Lab Results:  No results for input(s): WBC, HGB, HCT, PLT in the last 72 hours.  BMET Recent Labs    12/05/21 0311 12/06/21 0353  NA 138 137  K 3.9 4.4  CL 106 105  CO2 26 27  GLUCOSE 128* 131*  BUN 17 25*  CREATININE 0.43* 0.45  CALCIUM 10.4* 11.1*   PT/INR No results for input(s): LABPROT, INR in the last 72 hours. CMP     Component Value Date/Time   NA 137 12/06/2021 0353   K 4.4 12/06/2021 0353   CL 105 12/06/2021 0353   CO2 27 12/06/2021 0353   GLUCOSE 131 (H) 12/06/2021 0353   BUN 25 (H) 12/06/2021 0353   CREATININE 0.45 12/06/2021 0353   CALCIUM 11.1 (H) 12/06/2021 0353   PROT 7.7 12/06/2021 0353   ALBUMIN 2.3 (L) 12/06/2021 0353   AST 20 12/06/2021 0353   ALT 13 12/06/2021 0353   ALKPHOS 96 12/06/2021 0353   BILITOT 0.4 12/06/2021 0353   GFRNONAA >60 12/06/2021 0353   Lipase     Component Value Date/Time   LIPASE 22 11/17/2021 0955    Studies/Results: No results found.  Anti-infectives: Anti-infectives (From admission,  onward)    Start     Dose/Rate Route Frequency Ordered Stop   11/25/21 1000  piperacillin-tazobactam (ZOSYN) IVPB 3.375 g  Status:  Discontinued        3.375 g 12.5 mL/hr over 240 Minutes Intravenous Every 8 hours 11/25/21 0909 12/01/21 0822   11/17/21 1830  piperacillin-tazobactam (ZOSYN) IVPB 3.375 g        3.375 g 12.5 mL/hr over 240 Minutes Intravenous Every 8 hours 11/17/21 1415 11/22/21 2359   11/17/21 1230  piperacillin-tazobactam (ZOSYN) IVPB 3.375 g        3.375 g 100 mL/hr over 30 Minutes Intravenous  Once 11/17/21 1220 11/17/21 1304        Assessment/Plan 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 Harlow Asa 11/27 - CT 12/4 w/ trace peripancreatic and right paracolic gutter simple free fluid that extends inferiorly into the right pelvis with associated peritoneal wall enhancement/thickening. No organized drainable fluid collection.  - Restarted abx 12/5. WBC normalized. Abx stopped. - Minimal oral intake. Was on cortrak + TF 12/7-8. Cortrak became clogged and patient refused replacement 12/8. - Ileostomy output improved. Off Imodium. Cont BID metamucil - WOC following for ileostomy, mucous fistula; d/c wound vac 12/14. and start wet to dry given air bubbles noted in wound  bed. Monitor. - Mobilize. PT/OT recommend SNF, TOC consulted - Pathology benign. CT distal descending/proximal sigmoid bowel luminal collapse -- radiology recommending colonoscopy as outpatient.    FEN - Regular diet as tolerated, ensure TID, TPN. Encephalopathy improving, workup has been negative. Continue TPN and calorie count. continue TPN at 1/2 support tonight to see if it helps her appetite continue to improve. Appreciate RD updating supplement recs 12/143. She needs to mobilize - D/C purewick and OOB to bedside commode for uriantion.  At this point, if she gets a bed at a skilled nursing facility, she will need to go on TPN as  she is not taking in enough calories to maintain adequate nutrition with p.o. intake alone. VTE - SCDs, Lovenox ID - zosyn 11/27 - 12/2. 12/5-12/11 Foley - External  Encephalopathy/FTT - Alferd Apa called and spoke with the patient's family 12/7, her brother [name].  He reports prior to admission patient was living at home alone, ambulating without assistive devices, driving and doing ADLs.  He reports she has lived down here for 7+ years and has lived independently.  Her closest family member is her sister and brother in [place].  He reports he talks to her 1-2 times per week and does not recall her having any issues with memory that of been documented in the chart here.  The patient has had decreased mobility with PT/OT with generalized weakness and memory issues here. Avoid narcotics as able. TRH consulted and worked this up. Greatly appreciate their assistance.   LOS: 19 days    [name] , [facility] 12/06/2021, 9:58 AM Please see Amion for pager number during day hours 7:00am-4:30pm

## 2021-12-06 NOTE — TOC Progression Note (Signed)
Transition of Care [facility]) - Progression Note    Patient Details  Name: Sherry Hampton MRN: [id number] Date of Birth: 1956-07-25  [facility] CM/SW [place], [place] Phone Number: 12/06/2021, 10:06 AM  Clinical Narrative:   Saw bed offer from [name].  Spoke to [place] and gave her the following information pulled from today's surgery note. At this point, if she gets a bed at a skilled nursing facility, she will need to go on TPN as she is not taking in enough calories to maintain adequate nutrition with p.o. intake alone.  Shirlee Limerick confirms that they would not be able to accept patient unless off of TPN.  I had expanded bed search yesterday. No current bed offers. TOC will continue to follow during the course of hospitalization.     Expected Discharge Plan: [place] Barriers to Discharge: Continued Medical Work up, Ship broker, SNF Pending bed offer  Expected Discharge Plan and Services Expected Discharge Plan: [place] In-house Referral: Clinical Social Work     Living arrangements for the past 2 months: Single Family Home                                       Social Determinants of Health (SDOH) Interventions    Readmission Risk Interventions No flowsheet data found.

## 2021-12-06 NOTE — Progress Notes (Signed)
PHARMACY - TOTAL PARENTERAL NUTRITION CONSULT NOTE   Indication:  Inability to meet enteral needs  Patient Measurements: Height: 5\' 10"  (177.8 cm) Weight: 76.1 kg (167 lb 12.3 oz) IBW/kg (Calculated) : 68.5 TPN AdjBW (KG): 79.8 Body mass index is 24.07 kg/m. Usual Weight: 80 kg  Assessment: 65 y.o. 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 She is now extended right colectomy and ileostomy. She developed acute metabolic encephalopathy post-op. She was initiated on TF, but tube became clogged on 12/8 and patient refused replacement, and is not eating enough to maintain nutrition.  Calorie count has been ordered.  CCS has ordered TPN, but is ok waiting to start 12/12 pending calorie count and possible improvement in PO intake since she is medically stable for discharge.  Glucose / Insulin: No hx DM. CBGs 122-149, no SSI required Electrolytes: Phos improved after replacement 12/12 & 12/14, Ca elevated despite removing from TPN 12/15. K+ WNL but increased from yesterday Renal: SCr < 1, BUN slightly elevated (25) Hepatic: WNL Trig: 95 (12/12) Intake / Output: UOP 1200 ml, ostomy with stool 480ml, drains no output recorded MIVF: none GI Imaging: GI Surgeries / Procedures:  11/27: Expiratory laparotomy, extended right colectomy, end ileostomy  Central access: PICC placed 12/11 TPN start date: 12/12  Nutritional Goals: Goal TPN rate is 80 mL/hr (provides 105 g of protein and 1977 kcals per day)  RD Assessment: Estimated Needs Total Energy Estimated Needs: 1950-2150 Total Protein Estimated Needs: 95-105g Total Fluid Estimated Needs: 2L/day  Current Nutrition:  Regular diet Ensure Enlive po BID (350 kcal, 20 g protein) Juven BID (190 kcal, 5g protein)   Plan:   At  18:00:  Continue TPN at 53mL/hr (1/2 rate) Electrolytes in TPN:   Na 43mEq/L,  K 27mEq/L (decrease),  Ca 59mEq/L,  Mg 57mEq/L,  Phos 18mmol/L.  Cl:Ac 1:2 Patient taking PO multivitamin therefore, will not add to TPN Thiamine 100 mg PO daily given refeeding risk per RD DC CBG/SSI as they have been stable IVF per MD - none currently Monitor TPN labs on Mon/Thurs; CMET in AM   Peggyann Juba, PharmD, BCPS Clinical Pharmacist [facility] main pharmacy [phone] 12/06/2021 7:20 AM

## 2021-12-06 NOTE — Progress Notes (Signed)
Occupational Therapy Treatment Patient Details Name: Sherry Hampton MRN: [id number] DOB: [birth year] Today's Date: 12/06/2021   History of present illness Patient is 65 y.o. 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 Harlow Asa 11/17/21.  Pt with post op acute metabolic encephalpathy.  Per MD note, " The surgical team has talked to family who has stated this is not her baseline and comparing that she has never been diagnosed with any dementia or memory issues"  PMH significant for anemia, OA, GERD, HLD.   OT comments  Chart reviewed, RN cleared pt for participation in OT tx session. Tx session targeted improving tolerance during functional mobility to facilitate improved independent, safe ADL completion. Pt is oriented to self, and place, grossly to situation, not oriented to date. Progress noted in static/dynamic sitting balance, functional mobility, cognition. Pt continues to perform below PLOF, will continue to benefit from ongoing skilled OT to address functional deficits. PT is let in bedside chair, NAD, all needs met. RN aware of pt status.    Recommendations for follow up therapy are one component of a multi-disciplinary discharge planning process, led by the attending physician.  Recommendations may be updated based on patient status, additional functional criteria and insurance authorization.    Follow Up Recommendations  Skilled nursing-short term rehab (<3 hours/day)    Assistance Recommended at Discharge Frequent or constant Supervision/Assistance  Equipment Recommendations   (per next venue of care)    Recommendations for Other Services      Precautions / Restrictions Precautions Precautions: Fall Restrictions Weight Bearing Restrictions: No       Mobility Bed Mobility Overal bed mobility: Needs Assistance Bed Mobility: Supine to Sit     Supine to sit: Mod assist;+2 for physical  assistance;HOB elevated     General bed mobility comments: step by step vcs for sequencing of task to edge of bed.    Transfers Overall transfer level: Needs assistance Equipment used: Ambulation equipment used Transfers: Sit to/from Stand;Bed to chair/wheelchair/BSC Sit to Stand: Min assist;+2 physical assistance;From elevated surface             Transfer via Lift Equipment: Stedy   Balance Overall balance assessment: Needs assistance Sitting-balance support: Feet supported;Bilateral upper extremity supported Sitting balance-Leahy Scale: Poor Sitting balance - Comments: posterior lean, however improvements noted as evidenced by maintaing EOB with CGA-MIN A for approx 10 minutes; Pt sustaining static sitting balance with CGA with tactile cues for trunk flexion, approrpiate body mechanics for approx 10 seconds 3x.; Dynamic sitting balance targeted with pt with LOB at challenge to cross midline 3x. Postural control: Posterior lean Standing balance support: Reliant on assistive device for balance;During functional activity;Bilateral upper extremity supported Standing balance-Leahy Scale: Poor Standing balance comment: stedy frame                           ADL either performed or assessed with clinical judgement   ADL Overall ADL's : Needs assistance/impaired                     Lower Body Dressing: Maximal assistance   Toilet Transfer: Minimal assistance;+2 for physical assistance;Cueing for safety;Cueing for sequencing Toilet Transfer Details (indicate cue type and reason): simulated; use of stedy         Functional mobility during ADLs: Minimal assistance General ADL Comments: Cognition contiues to improve, 1 step directives followed with increased time. Pt  washed up prior to tx session.    Extremity/Trunk Assessment              Vision Baseline Vision/History: 1 Wears glasses     Perception     Praxis      Cognition Arousal/Alertness:  Awake/alert Behavior During Therapy: Flat affect Overall Cognitive Status: Impaired/Different from baseline Area of Impairment: Orientation;Attention;Memory;Following commands;Safety/judgement;Awareness;Problem solving                 Orientation Level: Situation;Time;Disoriented to Current Attention Level: Focused Memory: Decreased recall of precautions;Decreased short-term memory Following Commands: Follows one step commands inconsistently Safety/Judgement: Decreased awareness of safety;Decreased awareness of deficits Awareness: Intellectual Problem Solving: Slow processing;Decreased initiation;Difficulty sequencing;Requires verbal cues;Requires tactile cues General Comments: pt approrpiate during therapy, however required frequent vcs for attention to targeted tasks          Exercises Other Exercises Other Exercises: education re: role of OT, role of rehab, OOB mobility, progressing targeted tasks to improve ADL independence   Shoulder Instructions       General Comments      Pertinent Vitals/ Pain       Pain Assessment: No/denies pain  Home Living                                          Prior Functioning/Environment              Frequency  Min 2X/week        Progress Toward Goals  OT Goals(current goals can now be found in the care plan section)  Progress towards OT goals: Progressing toward goals  Acute Rehab OT Goals Patient Stated Goal: to go home OT Goal Formulation: With patient  Plan Discharge plan remains appropriate    Co-evaluation                 AM-PAC OT "6 Clicks" Daily Activity     Outcome Measure   Help from another person eating meals?: A Little Help from another person taking care of personal grooming?: A Lot Help from another person toileting, which includes using toliet, bedpan, or urinal?: Total Help from another person bathing (including washing, rinsing, drying)?: A Lot Help from another person  to put on and taking off regular upper body clothing?: A Little Help from another person to put on and taking off regular lower body clothing?: A Lot 6 Click Score: 13    End of Session Equipment Utilized During Treatment: Gait belt (stedy)  OT Visit Diagnosis: Unsteadiness on feet (R26.81);Other symptoms and signs involving cognitive function;Pain;History of falling (Z91.81);Repeated falls (R29.6);Muscle weakness (generalized) (M62.81)   Activity Tolerance Patient tolerated treatment well   Patient Left in chair;with call bell/phone within reach;with chair alarm set;with family/visitor present   Nurse Communication Mobility status;Need for lift equipment        Time: 8257-4935 OT Time Calculation (min): 18 min  Charges: OT General Charges $OT Visit: 1 Visit OT Treatments $Therapeutic Activity: 8-22 mins  Shanon Payor, OTD OTR/L  12/06/21, 12:36 PM

## 2021-12-07 LAB — COMPREHENSIVE METABOLIC PANEL
ALT: 13 U/L (ref 0–44)
AST: 20 U/L (ref 15–41)
Albumin: 2.5 g/dL — ABNORMAL LOW (ref 3.5–5.0)
Alkaline Phosphatase: 86 U/L (ref 38–126)
Anion gap: 7 (ref 5–15)
BUN: 23 mg/dL (ref 8–23)
CO2: 27 mmol/L (ref 22–32)
Calcium: 10.9 mg/dL — ABNORMAL HIGH (ref 8.9–10.3)
Chloride: 103 mmol/L (ref 98–111)
Creatinine, Ser: 0.48 mg/dL (ref 0.44–1.00)
GFR, Estimated: >60 mL/min (ref >60)
Glucose, Bld: 111 mg/dL — ABNORMAL HIGH (ref 70–99)
Potassium: 3.8 mmol/L (ref 3.5–5.1)
Sodium: 137 mmol/L (ref 135–145)
Total Bilirubin: 0.3 mg/dL (ref 0.3–1.2)
Total Protein: 8.1 g/dL (ref 6.5–8.1)

## 2021-12-07 MED ORDER — TRAVASOL 10 % IV SOLN
INTRAVENOUS | Status: AC
  Filled 2021-12-07: qty 518.4

## 2021-12-07 NOTE — Progress Notes (Signed)
PHARMACY - TOTAL PARENTERAL NUTRITION CONSULT NOTE   Indication:  Inability to meet enteral needs  Patient Measurements: Height: '5\' 10"'  (177.8 cm) Weight: 76.1 kg (167 lb 12.3 oz) IBW/kg (Calculated) : 68.5 TPN AdjBW (KG): 79.8 Body mass index is 24.07 kg/m. Usual Weight: 80 kg  Assessment: 65 y.o. 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 She is now extended right colectomy and ileostomy. She developed acute metabolic encephalopathy post-op. She was initiated on TF, but tube became clogged on 12/8 and patient refused replacement, and is not eating enough to maintain nutrition.  Calorie count has been ordered.  CCS ordered TPN to start on 12/12, pharmacy to manage.  Glucose / Insulin: No hx DM. CBGs were stable <150 with no SSI insulin requirements. SSI and CBG checks discontinued 12/16 -BG 111 this AM with lab draw Electrolytes: CorrCa (12.1) remains elevated despite removing Ca from TPN on 12/15. Other lytes WNL Renal: SCr < 1 & stable, BUN WNL Hepatic: LFTs, Alk Phos, T.bili all WNL. Albumin low Trig: WNL Intake / Output: Strict I/O not measured.  -UOP: 1050 mL + 1 unmeasured; stool: 800 mL MIVF: none GI Imaging: GI Surgeries / Procedures:  11/27: Expiratory laparotomy, extended right colectomy, end ileostomy  Central access: PICC placed 12/11 TPN start date: 12/12  Nutritional Goals: Goal TPN rate is 80 mL/hr (provides 105 g of protein and 1977 kcals per day)  RD Assessment: Estimated Needs Total Energy Estimated Needs: 1950-2150 Total Protein Estimated Needs: 95-105g Total Fluid Estimated Needs: 2L/day  Current Nutrition:  Regular diet (25% meal intake charted) Ensure Enlive po BID (350 kcal, 20 g protein) Juven BID (190 kcal, 5g protein)  TPN  Plan:    Per discussion with CCS, patient currently on TPN at 1/2 of goal rate.   At 18:00:  Continue TPN at 80m/hr (1/2 rate) Provides 52 g protein, 163 g dextrose (17%), and 1050 kcal Electrolytes in TPN. Increase K Na 517m/L K 5043mL Ca 0mE84m Mg 5mEq52mPhos 15mmo80mCl:Ac 1:2 Patient taking PO multivitamin therefore, will not add to TPN Thiamine 100 mg PO daily given refeeding risk per RD No CBG check and SSI IVF per MD - none currently Monitor TPN labs on Mon/Thurs  Mario Voong MLenis NoonmD 12/07/21 10:10 AM

## 2021-12-07 NOTE — Progress Notes (Signed)
20 Days Post-Op   Subjective/Chief Complaint: No complaints. Doesn't want to eat   Objective: Vital signs in last 24 hours: Temp:  [97.3 F (36.3 C)-98.3 F (36.8 C)] 98.3 F (36.8 C) (12/17 0634) Pulse Rate:  [79-99] 85 (12/17 0634) Resp:  [18] 18 (12/17 0634) BP: (111-116)/(74-89) 111/74 (12/17 0634) SpO2:  [97 %-98 %] 97 % (12/17 0634) Weight:  [76.1 kg] 76.1 kg (12/17 0500) Last BM Date: 12/06/21  Intake/Output from previous day: 12/16 0701 - 12/17 0700 In: 1741.1 [P.O.:540; I.V.:1201.1] Out: 1850 [Urine:1050; Stool:800] Intake/Output this shift: No intake/output data recorded.  General appearance: alert and cooperative Resp: clear to auscultation bilaterally Cardio: regular rate and rhythm GI: soft, ostomy productive  Lab Results:  No results for input(s): WBC, HGB, HCT, PLT in the last 72 hours. BMET Recent Labs    12/06/21 0353 12/07/21 0406  NA 137 137  K 4.4 3.8  CL 105 103  CO2 27 27  GLUCOSE 131* 111*  BUN 25* 23  CREATININE 0.45 0.48  CALCIUM 11.1* 10.9*   PT/INR No results for input(s): LABPROT, INR in the last 72 hours. ABG No results for input(s): PHART, HCO3 in the last 72 hours.  Invalid input(s): PCO2, PO2  Studies/Results: No results found.  Anti-infectives: Anti-infectives (From admission, onward)    Start     Dose/Rate Route Frequency Ordered Stop   11/25/21 1000  piperacillin-tazobactam (ZOSYN) IVPB 3.375 g  Status:  Discontinued        3.375 g 12.5 mL/hr over 240 Minutes Intravenous Every 8 hours 11/25/21 0909 12/01/21 0822   11/17/21 1830  piperacillin-tazobactam (ZOSYN) IVPB 3.375 g        3.375 g 12.5 mL/hr over 240 Minutes Intravenous Every 8 hours 11/17/21 1415 11/22/21 2359   11/17/21 1230  piperacillin-tazobactam (ZOSYN) IVPB 3.375 g        3.375 g 100 mL/hr over 30 Minutes Intravenous  Once 11/17/21 1220 11/17/21 1304       Assessment/Plan: s/p Procedure(s): EXPLORATORY LAPAROTOMY, EXTENDED RIGHT COLECTOMY, END  ILEOSTOMY, MUCOUS FISTULA (N/A) PARTIAL COLECTOMY (N/A) Advance diet as she will tolerate 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 Harlow Asa 11/27 - CT 12/4 w/ trace peripancreatic and right paracolic gutter simple free fluid that extends inferiorly into the right pelvis with associated peritoneal wall enhancement/thickening. No organized drainable fluid collection.  - Restarted abx 12/5. WBC normalized. Abx stopped. - Minimal oral intake. Was on cortrak + TF 12/7-8. Cortrak became clogged and patient refused replacement 12/8. - Ileostomy output improved. Off Imodium. Cont BID metamucil - WOC following for ileostomy, mucous fistula; d/c wound vac 12/14. and start wet to dry given air bubbles noted in wound bed. Monitor. - Mobilize. PT/OT recommend SNF, TOC consulted - Pathology benign. CT distal descending/proximal sigmoid bowel luminal collapse -- radiology recommending colonoscopy as outpatient.    FEN - Regular diet as tolerated, ensure TID, TPN. Encephalopathy improving, workup has been negative. Continue TPN and calorie count. continue TPN at 1/2 support tonight to see if it helps her appetite continue to improve. Appreciate RD updating supplement recs 12/143. She needs to mobilize - D/C purewick and OOB to bedside commode for uriantion.  At this point, if she gets a bed at a skilled nursing facility, she will need to go on TPN as she is not taking in enough calories to maintain adequate nutrition with p.o. intake alone. VTE - SCDs, Lovenox ID - zosyn  11/27 - 12/2. 12/5-12/11 Foley - External   Encephalopathy/FTT - Alferd Apa called and spoke with the patient's family 12/7, her brother [name].  He reports prior to admission patient was living at home alone, ambulating without assistive devices, driving and doing ADLs.  He reports she has lived down here for 7+ years and has lived independently.  Her  closest family member is her sister and brother in [place].  He reports he talks to her 1-2 times per week and does not recall her having any issues with memory that of been documented in the chart here.  The patient has had decreased mobility with PT/OT with generalized weakness and memory issues here. Avoid narcotics as able. TRH consulted and worked this up. Greatly appreciate their assistance.  LOS: 20 days    Autumn Messing III 12/07/2021

## 2021-12-08 MED ORDER — BOOST / RESOURCE BREEZE PO LIQD CUSTOM
1.0000 | Freq: Three times a day (TID) | ORAL | Status: DC
  Administered 2021-12-08 – 2021-12-15 (×13): 1 via ORAL

## 2021-12-08 MED ORDER — TRAVASOL 10 % IV SOLN
INTRAVENOUS | Status: AC
  Filled 2021-12-08: qty 518.4

## 2021-12-08 NOTE — Progress Notes (Signed)
21 Days Post-Op   Subjective/Chief Complaint: Feels better today. More alert   Objective: Vital signs in last 24 hours: Temp:  [97.9 F (36.6 C)-98.4 F (36.9 C)] 98.4 F (36.9 C) (12/18 0643) Pulse Rate:  [74-90] 74 (12/18 0643) Resp:  [16-18] 16 (12/18 0643) BP: (102-117)/(66-79) 109/66 (12/18 0643) SpO2:  [98 %-99 %] 98 % (12/18 0643) Last BM Date: 12/07/21  Intake/Output from previous day: 12/17 0701 - 12/18 0700 In: 1417.1 [P.O.:480; I.V.:937.1] Out: 2250 [Urine:2000; Stool:250] Intake/Output this shift: No intake/output data recorded.  General appearance: alert and cooperative Resp: clear to auscultation bilaterally Cardio: regular rate and rhythm GI: soft, nontender. Ostomy pink and productive  Lab Results:  No results for input(s): WBC, HGB, HCT, PLT in the last 72 hours. BMET Recent Labs    12/06/21 0353 12/07/21 0406  NA 137 137  K 4.4 3.8  CL 105 103  CO2 27 27  GLUCOSE 131* 111*  BUN 25* 23  CREATININE 0.45 0.48  CALCIUM 11.1* 10.9*   PT/INR No results for input(s): LABPROT, INR in the last 72 hours. ABG No results for input(s): PHART, HCO3 in the last 72 hours.  Invalid input(s): PCO2, PO2  Studies/Results: No results found.  Anti-infectives: Anti-infectives (From admission, onward)    Start     Dose/Rate Route Frequency Ordered Stop   11/25/21 1000  piperacillin-tazobactam (ZOSYN) IVPB 3.375 g  Status:  Discontinued        3.375 g 12.5 mL/hr over 240 Minutes Intravenous Every 8 hours 11/25/21 0909 12/01/21 0822   11/17/21 1830  piperacillin-tazobactam (ZOSYN) IVPB 3.375 g        3.375 g 12.5 mL/hr over 240 Minutes Intravenous Every 8 hours 11/17/21 1415 11/22/21 2359   11/17/21 1230  piperacillin-tazobactam (ZOSYN) IVPB 3.375 g        3.375 g 100 mL/hr over 30 Minutes Intravenous  Once 11/17/21 1220 11/17/21 1304       Assessment/Plan: s/p Procedure(s): EXPLORATORY LAPAROTOMY, EXTENDED RIGHT COLECTOMY, END ILEOSTOMY, MUCOUS  FISTULA (N/A) PARTIAL COLECTOMY (N/A) Advance diet. 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 Harlow Asa 11/27 - CT 12/4 w/ trace peripancreatic and right paracolic gutter simple free fluid that extends inferiorly into the right pelvis with associated peritoneal wall enhancement/thickening. No organized drainable fluid collection.  - Restarted abx 12/5. WBC normalized. Abx stopped. - Minimal oral intake. Was on cortrak + TF 12/7-8. Cortrak became clogged and patient refused replacement 12/8. - Ileostomy output improved. Off Imodium. Cont BID metamucil - WOC following for ileostomy, mucous fistula; d/c wound vac 12/14. and start wet to dry given air bubbles noted in wound bed. Monitor. - Mobilize. PT/OT recommend SNF, TOC consulted - Pathology benign. CT distal descending/proximal sigmoid bowel luminal collapse -- radiology recommending colonoscopy as outpatient.    FEN - Regular diet as tolerated, ensure TID, TPN. Encephalopathy improving, workup has been negative. Continue TPN and calorie count. continue TPN at 1/2 support tonight to see if it helps her appetite continue to improve. Appreciate RD updating supplement recs 12/143. She needs to mobilize - D/C purewick and OOB to bedside commode for uriantion.  At this point, if she gets a bed at a skilled nursing facility, she will need to go on TPN as she is not taking in enough calories to maintain adequate nutrition with p.o. intake alone. VTE - SCDs, Lovenox ID - zosyn 11/27 - 12/2. 12/5-12/11 Foley - External  Encephalopathy/FTT - Alferd Apa called and spoke with the patient's family 12/7, her brother [name].  He reports prior to admission patient was living at home alone, ambulating without assistive devices, driving and doing ADLs.  He reports she has lived down here for 7+ years and has lived independently.  Her closest family member  is her sister and brother in [place].  He reports he talks to her 1-2 times per week and does not recall her having any issues with memory that of been documented in the chart here.  The patient has had decreased mobility with PT/OT with generalized weakness and memory issues here. Avoid narcotics as able. TRH consulted and worked this up. Greatly appreciate their assistance.  LOS: 21 days    Autumn Messing III 12/08/2021

## 2021-12-08 NOTE — Progress Notes (Signed)
PHARMACY - TOTAL PARENTERAL NUTRITION CONSULT NOTE   Indication:  Inability to meet enteral needs  Patient Measurements: Height: '5\' 10"'  (177.8 cm) Weight: 76.1 kg (167 lb 12.3 oz) IBW/kg (Calculated) : 68.5 TPN AdjBW (KG): 79.8 Body mass index is 24.07 kg/m. Usual Weight: 80 kg  Assessment: 65 y.o. 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 She is now extended right colectomy and ileostomy. She developed acute metabolic encephalopathy post-op. She was initiated on TF, but tube became clogged on 12/8 and patient refused replacement, and is not eating enough to maintain nutrition.  Calorie count has been ordered.  CCS ordered TPN to start on 12/12, pharmacy to manage.  Glucose / Insulin: No hx DM. CBGs were stable <150 with no SSI insulin requirements. SSI and CBG checks discontinued 12/16 Electrolytes: CorrCa (12.1) remains elevated despite removing Ca from TPN on 12/15. Other lytes WNL. No new labs today. Renal: SCr < 1 & stable, BUN WNL Hepatic: LFTs, Alk Phos, T.bili all WNL. Albumin low Trig: WNL Intake / Output: Strict I/O not measured.  -UOP: 2000 mL + 1 unmeasured; stool: 250 mL MIVF: none GI Imaging: GI Surgeries / Procedures:  11/27: Expiratory laparotomy, extended right colectomy, end ileostomy  Central access: PICC placed 12/11 TPN start date: 12/12  Nutritional Goals: Goal TPN rate is 80 mL/hr (provides 105 g of protein and 1977 kcals per day)  RD Assessment: Estimated Needs Total Energy Estimated Needs: 1950-2150 Total Protein Estimated Needs: 95-105g Total Fluid Estimated Needs: 2L/day  Current Nutrition:  Regular diet (0% meal intake charted) Boost Breeze TID Juven BID (190 kcal, 5g protein)  TPN  Plan:   Per discussion with CCS, patient  currently on TPN at 1/2 of goal rate.   At 18:00:  Continue TPN at 73m/hr (1/2 rate) Provides 52 g protein, 163 g dextrose (17%), and 1050 kcal Electrolytes in TPN. No change Na 576m/L K 5013mL Ca 0mE109m Mg 5mEq67mPhos 15mmo7mCl:Ac 1:2 Patient taking PO multivitamin therefore, will not add to TPN Thiamine 100 mg PO daily given refeeding risk per RD No CBG check and SSI IVF per MD - none currently Monitor TPN labs on Mon/Thurs  Alanta Scobey MLenis NoonmD 12/08/21 8:35 AM

## 2021-12-09 LAB — COMPREHENSIVE METABOLIC PANEL
ALT: 16 U/L (ref 0–44)
AST: 19 U/L (ref 15–41)
Albumin: 2.4 g/dL — ABNORMAL LOW (ref 3.5–5.0)
Alkaline Phosphatase: 84 U/L (ref 38–126)
Anion gap: 4 — ABNORMAL LOW (ref 5–15)
BUN: 24 mg/dL — ABNORMAL HIGH (ref 8–23)
CO2: 27 mmol/L (ref 22–32)
Calcium: 10.4 mg/dL — ABNORMAL HIGH (ref 8.9–10.3)
Chloride: 106 mmol/L (ref 98–111)
Creatinine, Ser: 0.44 mg/dL (ref 0.44–1.00)
GFR, Estimated: >60 mL/min (ref >60)
Glucose, Bld: 123 mg/dL — ABNORMAL HIGH (ref 70–99)
Potassium: 4 mmol/L (ref 3.5–5.1)
Sodium: 137 mmol/L (ref 135–145)
Total Bilirubin: 0.4 mg/dL (ref 0.3–1.2)
Total Protein: 7.6 g/dL (ref 6.5–8.1)

## 2021-12-09 LAB — GLUCOSE, CAPILLARY: Glucose-Capillary: 139 mg/dL — ABNORMAL HIGH (ref 70–99)

## 2021-12-09 LAB — MAGNESIUM: Magnesium: 2.1 mg/dL (ref 1.7–2.4)

## 2021-12-09 LAB — PHOSPHORUS: Phosphorus: 2.9 mg/dL (ref 2.5–4.6)

## 2021-12-09 LAB — TRIGLYCERIDES: Triglycerides: 64 mg/dL (ref <150)

## 2021-12-09 MED ORDER — MEGESTROL ACETATE 40 MG PO TABS
400.0000 mg | ORAL_TABLET | Freq: Every day | ORAL | Status: DC
  Filled 2021-12-09: qty 10

## 2021-12-09 MED ORDER — TRAVASOL 10 % IV SOLN
INTRAVENOUS | Status: AC
  Filled 2021-12-09: qty 540

## 2021-12-09 MED ORDER — MEGESTROL ACETATE 400 MG/10ML PO SUSP
400.0000 mg | Freq: Every day | ORAL | Status: DC
Start: 2021-12-09 — End: 2021-12-10
  Administered 2021-12-09 – 2021-12-10 (×2): 400 mg via ORAL
  Filled 2021-12-09 (×2): qty 10

## 2021-12-09 MED ORDER — STERILE WATER FOR INJECTION IV SOLN
INTRAMUSCULAR | Status: DC
Start: 2021-12-09 — End: 2021-12-09

## 2021-12-09 MED ORDER — MEGESTROL ACETATE 40 MG PO TABS: 160.0000 mg | ORAL_TABLET | Freq: Two times a day (BID) | ORAL | Status: DC

## 2021-12-09 NOTE — Progress Notes (Signed)
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 Harlow Asa 11/27  - CT 12/4 w/ trace peripancreatic and right paracolic gutter simple free fluid that extends inferiorly into the right pelvis with associated peritoneal wall enhancement/thickening. No organized drainable fluid collection.   - Restarted abx 12/5. WBC normalized. Abx stopped.  - Minimal oral intake. Was on cortrak + TF 12/7-8. Cortrak became clogged and patient refused replacement 12/8.  - Ileostomy output improved. Off Imodium. Cont BID metamucil  - WOC following for ileostomy, mucous fistula; d/c wound vac 12/14. and start wet to dry given air bubbles noted in wound bed. Monitor.  - purulent drainage in wound noted, increase dressing change to TID  - Mobilize. PT/OT recommend SNF, TOC consulted  - Pathology benign.    PT Comments    Pt is NOT progressing with her mobility.  Attempted OOB to [facility] required + 2 Max Assist.   General Comments: Cognition has improved.  Last time I saw her she was "snowed" on meds.  This session she appears more alert and talking on phone with her brother.  But also, at times required repeat instruction to focus to task. General bed mobility comments: pt had great difficulty coordination her self to complete transfer from supine to EOB.  Poor midline righting and exaggerated impaired gross motor control.  Severe postererior lean with poor self correction.  Pt "pushing" with tactile asssit.  Unable to sit EOB until perfectly positioned to midline by therapist. General transfer comment: attempting transfer from bed to [facility] was very difficult.  Pt with poor self ability to rise present with Gross Motor Ataxia and severe posterior lean.  75% VC's for correction however pt unable to coordinate and  control.  Pt HAS the strength to stand but lacks motor skills.  Used the EVA walker for increased support however pt was unable to weight shift, unable to off load and unable to take any functional steps. Spastic and rigid.   RN present during session and observered. Prior to admit pt was living home alone, Indep and driving.   Pt would benefit from a Neuro Consult to assess  her poor motor control, ataxia and coordination.   Recommendations for follow up therapy are one component of a multi-disciplinary discharge planning process, led by the attending physician.  Recommendations may be updated based on patient status, additional functional criteria and insurance authorization.  Follow Up Recommendations  Skilled nursing-short term rehab (<3 hours/day)     Assistance Recommended at Discharge Frequent or constant Supervision/Assistance  Equipment Recommendations  None recommended by PT    Recommendations for Other Services       Precautions / Restrictions Precautions Precautions: Fall Precaution Comments: EXPLORATORY LAPAROTOMY, EXTENDED RIGHT COLECTOMY, END ILEOSTOMY, MUCOUS FISTULA (Abdomen)  PARTIAL COLECTOMY     Mobility  Bed Mobility Overal bed mobility: Needs Assistance Bed Mobility: Supine to Sit;Sit to Supine     Supine to sit: Max assist Sit to supine: Max assist;Total assist   General bed mobility comments: pt had great difficulty coordination her self to complete transfer from supine to EOB.  Poor midline righting and exaggerated impaired gross motor control.  Severe postererior lean with poor self correction.  Pt "pushing" with tactile asssit.  Unable to sit EOB until perfectly positioned to midline by therapist.    Transfers  Overall transfer level: Needs assistance Equipment used: None Transfers: Bed to chair/wheelchair/BSC Sit to Stand: +2 safety/equipment;+2 physical assistance;Max assist           General transfer comment: attempting transfer from bed to  [facility] was very difficult.  Pt with poor self ability to rise present with Gross Motor Ataxia and severe posterior lean.  75% VC's for correction however pt unable to coordinate and control.  Pt HAS the strength to stand but lacks motor skills.  Used the EVA walker for increased support however pt was unable to weight shift, unable to off load and unable to take any functional steps. Spastic and rigid.   RN present during session and observered.    Ambulation/Gait               General Gait Details: unable to attempt due to poor transfer ability   Stairs             Wheelchair Mobility    Modified Rankin (Stroke Patients Only)       Balance                                            Cognition Arousal/Alertness: Awake/alert Behavior During Therapy: WFL for tasks assessed/performed                                   General Comments: Appears more alert and talking on phone with her brother.  But also, at times required repeat instruction to focus to task.        Exercises      General Comments        Pertinent Vitals/Pain Pain Assessment: No/denies pain    Home Living                          Prior Function            PT Goals (current goals can now be found in the care plan section) Progress towards PT goals: Not progressing toward goals - comment    Frequency    Min 2X/week      PT Plan Current plan remains appropriate    Co-evaluation              AM-PAC PT "6 Clicks" Mobility   Outcome Measure  Help needed turning from your back to your side while in a flat bed without using bedrails?: Total Help needed moving from lying on your back to sitting on the side of a flat bed without using bedrails?: Total Help needed moving to and from a bed to a chair (including a wheelchair)?: Total Help needed standing up from a chair using your arms (e.g., wheelchair or bedside chair)?: Total Help needed to  walk in hospital room?: Total Help needed climbing 3-5 steps with a railing? : Total 6 Click Score: 6    End of Session Equipment Utilized During Treatment: Gait belt Activity Tolerance: Treatment limited secondary to medical complications (Comment) Patient left: in bed;with call bell/phone within reach;with family/visitor present;with nursing/sitter in room Nurse Communication: Mobility status PT Visit Diagnosis: Muscle weakness (generalized) (M62.81);Difficulty in walking, not elsewhere classified (R26.2)     Time: 1761-6073 PT Time Calculation (min) (ACUTE ONLY): 29 min  Charges:  $Therapeutic Activity: 23-37 mins                     {  Rica Koyanagi  PTA Acute  Rehabilitation Services Pager      [phone] Office      [phone]

## 2021-12-09 NOTE — Progress Notes (Addendum)
PHARMACY - TOTAL PARENTERAL NUTRITION CONSULT NOTE   Indication:  Inability to meet enteral needs  Patient Measurements: Height: '5\' 10"'  (177.8 cm) Weight: 77.3 kg (170 lb 6.7 oz) IBW/kg (Calculated) : 68.5 TPN AdjBW (KG): 79.8 Body mass index is 24.45 kg/m. Usual Weight: 80 kg  Assessment: 65 y.o. 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 She is now extended right colectomy and ileostomy. She developed acute metabolic encephalopathy post-op. She was initiated on TF, but tube became clogged on 12/8 and patient refused replacement, and is not eating enough to maintain nutrition.  Calorie count has been ordered.  CCS ordered TPN to start on 12/12, pharmacy to manage.  Glucose / Insulin: No hx DM. CBGs were stable <150 with no SSI insulin requirements. SSI and CBG checks discontinued 12/16 Electrolytes: CorrCa (11.68) remains elevated despite removing Ca from TPN on 12/15. Other lytes WNL. Renal: SCr < 1 stable, BUN 24 Hepatic: LFTs, Alk Phos, T.bili all WNL. Albumin low Trig: WNL Intake / Output: Strict I/O not measured.  -UOP 1250 mL (4x unmeasured), stool 575 mL MIVF: none GI Imaging: GI Surgeries / Procedures:  11/27: Expiratory laparotomy, extended right colectomy, end ileostomy  Central access: PICC placed 12/11 TPN start date: 12/12  Nutritional Goals: Goal TPN rate is 80 mL/hr (provides 105 g of protein and 1977 kcals per day) 36/14 1/2 rate cyclic TPN: 4315 mL provides 54g protein and 1094 kcal per day  RD Assessment: Estimated Needs Total Energy Estimated Needs: 1950-2150 Total Protein Estimated Needs: 95-105g Total Fluid Estimated Needs: 2L/day  Current Nutrition:  Regular diet (0% meal intake charted) Boost or Breeze TID Juven BID (190 kcal, 5g protein)   TPN  Plan:  Patient currently on TPN at 1/2 of goal rate, changing to cyclic TPN on 40/08.    At 67:61:  Start cyclic TPN to cycle over 18 hours (1000 mL) with 1 hour taper up/down Electrolytes in TPN. No change Na 36mq/L K 56m/L Ca 58m92mL Mg 5mE34m Phos 15mm8m Cl:Ac 1:2 Patient taking PO multivitamin therefore, will not add to TPN Thiamine 100 mg PO daily given refeeding risk per RD CBG checks q6h since starting cyclic TPN  IVF per MD - none currently Monitor TPN labs on Mon/Thurs   [name], BCPS Clinical Pharmacist [facility] main pharmacy [phone]/2022 8:38 AM

## 2021-12-09 NOTE — Progress Notes (Signed)
Progress Note  22 Days Post-Op  Subjective: Pt reports she doesn't really feel well overall but unable to elaborate on this. She reports minimal abdominal pain. She denies nausea but reports poor appetite still. She is having stool output.   Objective: Vital signs in last 24 hours: Temp:  [98 F (36.7 C)-98.7 F (37.1 C)] 98 F (36.7 C) (12/19 0548) Pulse Rate:  [72-101] 87 (12/19 0548) Resp:  [14-17] 17 (12/19 0548) BP: (109-110)/(61-80) 109/61 (12/19 0548) SpO2:  [94 %-100 %] 100 % (12/19 0548) Weight:  [77.3 kg] 77.3 kg (12/19 0500) Last BM Date: 12/08/21  Intake/Output from previous day: 12/18 0701 - 12/19 0700 In: 1459.5 [P.O.:540; I.V.:919.5] Out: 1825 [Urine:1250; Stool:575] Intake/Output this shift: No intake/output data recorded.  PE: General: pleasant, WD, elderly female who is up in chair in NAD Heart: regular, rate, and rhythm.   Lungs: Respiratory effort nonlabored Abd: soft, NT, ND, +BS, mucus fistula viable with minimal drainage, ileostomy viable with stool and gas in ostomy appliance, midline wound with mostly healthy granulation tissue and some purulent appearing drainage inferiorly MS: all 4 extremities are symmetrical with no cyanosis, clubbing, or edema.   Lab Results:  No results for input(s): WBC, HGB, HCT, PLT in the last 72 hours. BMET Recent Labs    12/07/21 0406 12/09/21 0354  NA 137 137  K 3.8 4.0  CL 103 106  CO2 27 27  GLUCOSE 111* 123*  BUN 23 24*  CREATININE 0.48 0.44  CALCIUM 10.9* 10.4*   PT/INR No results for input(s): LABPROT, INR in the last 72 hours. CMP     Component Value Date/Time   NA 137 12/09/2021 0354   K 4.0 12/09/2021 0354   CL 106 12/09/2021 0354   CO2 27 12/09/2021 0354   GLUCOSE 123 (H) 12/09/2021 0354   BUN 24 (H) 12/09/2021 0354   CREATININE 0.44 12/09/2021 0354   CALCIUM 10.4 (H) 12/09/2021 0354   PROT 7.6 12/09/2021 0354   ALBUMIN 2.4 (L) 12/09/2021 0354   AST 19 12/09/2021 0354   ALT 16  12/09/2021 0354   ALKPHOS 84 12/09/2021 0354   BILITOT 0.4 12/09/2021 0354   GFRNONAA >60 12/09/2021 0354   Lipase     Component Value Date/Time   LIPASE 22 11/17/2021 0955       Studies/Results: No results found.  Anti-infectives: Anti-infectives (From admission, onward)    Start     Dose/Rate Route Frequency Ordered Stop   11/25/21 1000  piperacillin-tazobactam (ZOSYN) IVPB 3.375 g  Status:  Discontinued        3.375 g 12.5 mL/hr over 240 Minutes Intravenous Every 8 hours 11/25/21 0909 12/01/21 0822   11/17/21 1830  piperacillin-tazobactam (ZOSYN) IVPB 3.375 g        3.375 g 12.5 mL/hr over 240 Minutes Intravenous Every 8 hours 11/17/21 1415 11/22/21 2359   11/17/21 1230  piperacillin-tazobactam (ZOSYN) IVPB 3.375 g        3.375 g 100 mL/hr over 30 Minutes Intravenous  Once 11/17/21 1220 11/17/21 1304        Assessment/Plan 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 Harlow Asa 11/27 - CT 12/4 w/ trace peripancreatic and right paracolic gutter simple free fluid that extends inferiorly into the right pelvis with associated peritoneal wall enhancement/thickening. No organized drainable fluid collection.  - Restarted abx 12/5. WBC normalized. Abx stopped. - Minimal oral intake. Was on cortrak + TF 12/7-8. Cortrak became  clogged and patient refused replacement 12/8. - Ileostomy output improved. Off Imodium. Cont BID metamucil - WOC following for ileostomy, mucous fistula; d/c wound vac 12/14. and start wet to dry given air bubbles noted in wound bed. Monitor. - purulent drainage in wound noted, increase dressing change to TID - Mobilize. PT/OT recommend SNF, TOC consulted - Pathology benign. CT distal descending/proximal sigmoid bowel luminal collapse -- radiology recommending colonoscopy as outpatient.    FEN - Regular diet as tolerated, ensure TID, TPN. Cycle TPN at night to try to stimulate  appetite and add megace BID VTE - SCDs, Lovenox ID - zosyn 11/27 - 12/2. 12/5-12/11 Foley - purewick, would prefer patient be getting up at least to [facility]   Encephalopathy/FTT - Alferd Apa called and spoke with the patient's family 12/7, her brother [name].  He reports prior to admission patient was living at home alone, ambulating without assistive devices, driving and doing ADLs.  He reports she has lived down here for 7+ years and has lived independently.  Her closest family member is her sister and brother in [place].  He reports he talks to her 1-2 times per week and does not recall her having any issues with memory that of been documented in the chart here.  The patient has had decreased mobility with PT/OT with generalized weakness and memory issues here. Avoid narcotics as able. TRH consulted and worked this up. Greatly appreciate their assistance.  LOS: 22 days    Norm Parcel, [facility] 12/09/2021, 8:36 AM Please see Amion for pager number during day hours 7:00am-4:30pm

## 2021-12-10 DIAGNOSIS — R4182 Altered mental status, unspecified: Secondary | ICD-10-CM

## 2021-12-10 LAB — CBC
HCT: 31.9 % — ABNORMAL LOW (ref 36.0–46.0)
Hemoglobin: 10 g/dL — ABNORMAL LOW (ref 12.0–15.0)
MCH: 28.9 pg (ref 26.0–34.0)
MCHC: 31.3 g/dL (ref 30.0–36.0)
MCV: 92.2 fL (ref 80.0–100.0)
Platelets: 406 10*3/uL — ABNORMAL HIGH (ref 150–400)
RBC: 3.46 MIL/uL — ABNORMAL LOW (ref 3.87–5.11)
RDW: 15.8 % — ABNORMAL HIGH (ref 11.5–15.5)
WBC: 10.1 10*3/uL (ref 4.0–10.5)
nRBC: 0 % (ref 0.0–0.2)

## 2021-12-10 LAB — GLUCOSE, CAPILLARY
Glucose-Capillary: 109 mg/dL — ABNORMAL HIGH (ref 70–99)
Glucose-Capillary: 113 mg/dL — ABNORMAL HIGH (ref 70–99)
Glucose-Capillary: 115 mg/dL — ABNORMAL HIGH (ref 70–99)
Glucose-Capillary: 121 mg/dL — ABNORMAL HIGH (ref 70–99)
Glucose-Capillary: 145 mg/dL — ABNORMAL HIGH (ref 70–99)

## 2021-12-10 MED ORDER — ENSURE ENLIVE PO LIQD
237.0000 mL | Freq: Three times a day (TID) | ORAL | Status: DC
  Administered 2021-12-11 – 2022-01-20 (×57): 237 mL via ORAL

## 2021-12-10 MED ORDER — THIAMINE HCL 100 MG/ML IJ SOLN: 100.0000 mg | Freq: Every day | INTRAMUSCULAR | Status: DC

## 2021-12-10 MED ORDER — THIAMINE HCL 100 MG PO TABS
100.0000 mg | ORAL_TABLET | Freq: Two times a day (BID) | ORAL | Status: DC
  Administered 2021-12-18 – 2022-01-20 (×58): 100 mg via ORAL
  Filled 2021-12-10 (×65): qty 1

## 2021-12-10 MED ORDER — THIAMINE HCL 100 MG/ML IJ SOLN
250.0000 mg | Freq: Every day | INTRAVENOUS | Status: AC
  Administered 2021-12-12 – 2021-12-17 (×6): 250 mg via INTRAVENOUS
  Filled 2021-12-10 (×6): qty 2.5

## 2021-12-10 MED ORDER — TRAVASOL 10 % IV SOLN
INTRAVENOUS | Status: AC
  Filled 2021-12-10: qty 540

## 2021-12-10 MED ORDER — MIRTAZAPINE 15 MG PO TABS
15.0000 mg | ORAL_TABLET | Freq: Every day | ORAL | Status: DC
  Administered 2021-12-10 – 2021-12-13 (×4): 15 mg via ORAL
  Filled 2021-12-10 (×4): qty 1

## 2021-12-10 MED ORDER — THIAMINE HCL 100 MG/ML IJ SOLN
500.0000 mg | Freq: Three times a day (TID) | INTRAVENOUS | Status: AC
  Administered 2021-12-10 – 2021-12-12 (×6): 500 mg via INTRAVENOUS
  Filled 2021-12-10 (×7): qty 5

## 2021-12-10 NOTE — Progress Notes (Signed)
Nutrition Follow-up  DOCUMENTATION CODES:   Severe malnutrition in context of acute illness/injury  INTERVENTION:   -Cyclic TPN management per Pharmacy -Continue thiamine supplementation (dosage was increased by Neurology)   -Ensure Enlive po BID, each supplement provides 350 kcal and 20 grams of protein -Pt prefers this in strawberry   -Milk served with every meal tray (pt enjoys 2% -provides 120 kcals and 8g protein per carton)   -Juven, each serving provides 95kcal and 2.5g of protein (amino acids glutamine and arginine)    NUTRITION DIAGNOSIS:   Severe Malnutrition related to acute illness as evidenced by energy intake < or equal to 50% for > or equal to 5 days, moderate fat depletion, severe muscle depletion.  Ongoing.  GOAL:   Patient will meet greater than or equal to 90% of their needs  Meeting with TPN.  MONITOR:   PO intake, Supplement acceptance, Labs, Weight trends, I & O's (TPN)  ASSESSMENT:   65 year old female who presents to the emergency department with sudden onset of abdominal pain and distention.CT scan of abdomen and pelvis shows findings consistent with free air with a large pneumoperitoneum.  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  11/27: s/p ex lap, right colectomy, ileostomy 12/11: PICC placed 12/12: TPN initiated   TPN transitioned to cyclic TPN x 16 hours (providing 1094 kcals and 54g protein). Pt has been started on some appetite stimulants. Still only consuming small amounts. Identified in follow-up on 12/13 that pt only likes strawberry Ensure. Ensure was then d/c by surgery on 12/18. Boost Breeze was then ordered. Pt prefers Ensure so will re-order. Did not see indication for stopping Ensure.  Admission weight: 176 lbs. Current weight: 164 lbs.  Medications: Folic acid, Megace, Remeron, Multivitamin with minerals daily, Thiamine  Labs reviewed:  CBGs: 109-145 TG: 64  Diet Order:   Diet Order              Diet regular Room service appropriate? Yes; Fluid consistency: Thin  Diet effective now                   EDUCATION NEEDS:   Education needs have been addressed  Skin:  Skin Assessment: Skin Integrity Issues: Skin Integrity Issues:: Incisions Incisions: 11/27 abdomen  Last BM:  12/20- ileostomy  Height:   Ht Readings from Last 1 Encounters:  11/17/21 5\' 10"  (1.778 m)    Weight:   Wt Readings from Last 1 Encounters:  12/10/21 74.5 kg    BMI:  Body mass index is 23.57 kg/m.  Estimated Nutritional Needs:   Kcal:  1950-2150  Protein:  95-105g  Fluid:  2L/day  Clayton Bibles, MS, RD, LDN Inpatient Clinical Dietitian Contact information available via Amion

## 2021-12-10 NOTE — Progress Notes (Addendum)
Chaplain engaged in an initial visit with Annett.  Clarabel shared about her healthcare journey and the pain she has been in.  She also shared about feeling "lonely" while in the hospital.  She voiced that she misses being with her siblings.  She also talked being in the hospital for the holidays and how sad that feels for her.  As the conversation continued, Chaplain noticed that Bellamy would go from one topic to another very quickly.  Willamina was aware of this and voiced that she has been feeling very "scatter-brained."  Chaplain could assess that Jarrett was having a hard time remembering what she was talking about or forming the words for her thoughts. Neurology soon entered during Chaplain's visit.  Chaplain was able to stay with Mili through most of that time to continue to provide her with comfort.   Chaplain also recognized that Brooks felt bad for having a hard time in the hospital.  Chaplain worked to normalize and affirm her feelings by acknowledging all her body has been through and what it means to be separated from family while hospitalized.    Chaplain offered listening, presence, and support.    12/10/21 1600  Clinical Encounter Type  Visited With Patient  Visit Type Initial;Spiritual support  Referral From Patient  Consult/Referral To Chaplain

## 2021-12-10 NOTE — Progress Notes (Signed)
Progress Note  23 Days Post-Op  Subjective: Pt reports just feeling overwhelmed and emotionally drained by all this. She is alert and mostly oriented but reports someone was yelling at her a lot yesterday and this upset her. PT reported that she was having a lot of trouble with motor coordination yesterday. She does not note any difference in appetite after starting megace yesterday or cycling TPN. She reports some occasional twinges in abdomen but no severe or uncontrolled pain. When lifting her gown she states "I just can't handle all of this".   Objective: Vital signs in last 24 hours: Temp:  [98 F (36.7 C)-98.7 F (37.1 C)] 98 F (36.7 C) (12/20 0603) Pulse Rate:  [86-98] 86 (12/20 0603) Resp:  [16] 16 (12/20 0603) BP: (102-115)/(60-81) 102/81 (12/20 0603) SpO2:  [97 %-100 %] 100 % (12/20 0603) Weight:  [74.5 kg] 74.5 kg (12/20 0500) Last BM Date: 12/09/21  Intake/Output from previous day: 12/19 0701 - 12/20 0700 In: 1420.6 [P.O.:390; I.V.:1030.6] Out: 1400 [Urine:1200; Stool:200] Intake/Output this shift: Total I/O In: -  Out: 160 [Stool:160]  PE: General: pleasant, WD, elderly female who is in bed and in NAD Heart: regular, rate, and rhythm.   Lungs: Respiratory effort nonlabored Abd: soft, NT, ND, +BS, mucus fistula viable with some thin drainage, ileostomy viable with stool and gas in ostomy appliance, midline wound with mostly healthy granulation tissue and some purulent appearing drainage inferiorly MS: all 4 extremities are symmetrical with no cyanosis, clubbing, or edema.   Lab Results:  No results for input(s): WBC, HGB, HCT, PLT in the last 72 hours. BMET Recent Labs    12/09/21 0354  NA 137  K 4.0  CL 106  CO2 27  GLUCOSE 123*  BUN 24*  CREATININE 0.44  CALCIUM 10.4*   PT/INR No results for input(s): LABPROT, INR in the last 72 hours. CMP     Component Value Date/Time   NA 137 12/09/2021 0354   K 4.0 12/09/2021 0354   CL 106 12/09/2021  0354   CO2 27 12/09/2021 0354   GLUCOSE 123 (H) 12/09/2021 0354   BUN 24 (H) 12/09/2021 0354   CREATININE 0.44 12/09/2021 0354   CALCIUM 10.4 (H) 12/09/2021 0354   PROT 7.6 12/09/2021 0354   ALBUMIN 2.4 (L) 12/09/2021 0354   AST 19 12/09/2021 0354   ALT 16 12/09/2021 0354   ALKPHOS 84 12/09/2021 0354   BILITOT 0.4 12/09/2021 0354   GFRNONAA >60 12/09/2021 0354   Lipase     Component Value Date/Time   LIPASE 22 11/17/2021 0955       Studies/Results: No results found.  Anti-infectives: Anti-infectives (From admission, onward)    Start     Dose/Rate Route Frequency Ordered Stop   11/25/21 1000  piperacillin-tazobactam (ZOSYN) IVPB 3.375 g  Status:  Discontinued        3.375 g 12.5 mL/hr over 240 Minutes Intravenous Every 8 hours 11/25/21 0909 12/01/21 0822   11/17/21 1830  piperacillin-tazobactam (ZOSYN) IVPB 3.375 g        3.375 g 12.5 mL/hr over 240 Minutes Intravenous Every 8 hours 11/17/21 1415 11/22/21 2359   11/17/21 1230  piperacillin-tazobactam (ZOSYN) IVPB 3.375 g        3.375 g 100 mL/hr over 30 Minutes Intravenous  Once 11/17/21 1220 11/17/21 1304        Assessment/Plan 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 Harlow Asa 11/27 - path:  necrotic colon at site of perforation, no malignancy or dysplasia noted  - CT 12/4 w/ trace peripancreatic and right paracolic gutter simple free fluid that extends inferiorly into the right pelvis with associated peritoneal wall enhancement/thickening. No organized drainable fluid collection.  - Restarted abx 12/5. WBC normalized. Abx stopped. - Minimal oral intake. Was on cortrak + TF 12/7-8. Cortrak became clogged and patient refused replacement 12/8. - Ileostomy output improved. Off Imodium. Cont BID metamucil - WOC following for ileostomy, mucous fistula; d/c wound vac 12/14. and start wet to dry given air bubbles noted in wound bed.  Monitor. - purulent drainage in wound noted, continue dressing change TID - Mobilize. PT/OT recommend SNF, TOC consulted - Pathology benign. CT distal descending/proximal sigmoid bowel luminal collapse -- radiology recommending colonoscopy as outpatient.    FEN - Regular diet as tolerated, ensure TID, TPN. Cycle TPN at night to try to stimulate appetite and continue megace BID VTE - SCDs, Lovenox ID - zosyn 11/27 - 12/2. 12/5-12/11 Foley - purewick, would prefer patient be getting up at least to [facility]   Encephalopathy/FTT - Alferd Apa called and spoke with the patient's family 12/7, her brother [name].  He reports prior to admission patient was living at home alone, ambulating without assistive devices, driving and doing ADLs.  He reports she has lived down here for 7+ years and has lived independently.  Her closest family member is her sister and brother in [place].  He reports he talks to her 1-2 times per week and does not recall her having any issues with memory that of been documented in the chart here.  The patient has had decreased mobility with PT/OT with generalized weakness and memory issues here. Avoid narcotics as able. TRH consulted and worked this up, encephalopathy was noted to be improving and TRH signed off 12/10. Patient has been on folic acid and thiamine supplements. Noted to have poor motor control and coordination yesterday by PT which is different from last week per therapy notes, Neurology consulted today and SLP consulted as well for cognitive eval.   LOS: 23 days    Norm Parcel, [facility] 12/10/2021, 9:20 AM Please see Amion for pager number during day hours 7:00am-4:30pm

## 2021-12-10 NOTE — Progress Notes (Signed)
Spoke with the brother, Milbert Coulter about obtaining the patient's medical records and how he would need to reach out to them. He stated he was coming in to town for the holidays and he would talk to them at that time.

## 2021-12-10 NOTE — Consult Note (Addendum)
Neurology Consultation  Reason for Consult: ataxia with PT session last p.m.   Referring Physician: Sandria Senter, PA.   CC: ataxia with PT.  History is obtained from: Patient, chart.   HPI: Sherry Hampton is a 65 y.o. female with a PMHx of anemia,  RA, cataract, GERD, and HLD. She presented on 11/17/21 with sudden onset of abdominal pain and distention and was found to have a perforated viscus and underwent surgical repair.    Per dietary note from 12/02/21: "Severe Malnutrition related to acute illness as evidenced by energy intake < or equal to 50% for > or equal to 5 days, moderate fat depletion, severe muscle depletion." She is on Thiamine, but only once a day. Her electrolytes are being followed due to the TPN.   She admits to being anorexic and nibbling on various foods, but has no appetite. 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 Can not tell NP if she has lost weight or how much.  Surgery placed her on TPN and Megace yesterday. Although NP can not find an original weight on her, would presume she has lost weight since the finding of her perforation.   Per PT note 12/09/21: "Pt is NOT progressing with her mobility. Attempted OOB to [facility] required + 2 Max Assist. Pt had great difficulty coordination her self to complete transfer from supine to EOB.  Poor midline righting and exaggerated impaired gross motor control. Ataxia and severe posterior lean. Pt has the strength to stand but lacks motor skills.  Used the EVA walker for increased support however pt was unable to weight shift, unable to off load and unable to take any functional steps. Spastic and rigid.  Prior to admit pt was living home alone, Indep and driving. "    ROS: A robust ROS was performed and is negative except as noted in the HPI.  Past Medical History:  Diagnosis Date   Anemia    past hx    Arthritis    wrists , knees,  feet    Cataract    cordical cataracts per pt    Constipation    takes a stool softener  daily-  as long as does this has daily soft stools    GERD (gastroesophageal reflux disease)    Hemorrhoids    Hyperlipidemia     Family History  Problem Relation Age of Onset   Colon polyps Father    Esophageal cancer Brother    Colon cancer Paternal Grandmother    Rectal cancer Neg Hx    Stomach cancer Neg Hx    Pancreatic cancer Neg Hx     Social History:   reports that she has quit smoking. She has never used smokeless tobacco. She reports that she does not drink alcohol and does not use drugs.  Medications  Current Facility-Administered Medications:    0.9 %  sodium chloride infusion, , Intravenous, PRN, [name], MD, Last Rate: 10 mL/hr at 11/22/21 0434, New Bag at 11/22/21 0434   acetaminophen (TYLENOL) tablet 1,000 mg, 1,000 mg, Oral, Q6H, Saverio Danker, PA-C, 1,000 mg at 12/10/21 0617   chlorhexidine (PERIDEX) 0.12 % solution 15 mL, 15 mL, Mouth Rinse, BID, [name], MD, 15 mL at 12/10/21 0946   Chlorhexidine Gluconate Cloth 2 % PADS 6 each, 6 each, Topical, Daily, Eugenie Filler, MD, 6 each at 12/09/21 1104   enoxaparin (LOVENOX) injection 40 mg, 40 mg, Subcutaneous, Q24H, [name], MD, 40 mg at 12/10/21 0945   feeding supplement (  BOOST / RESOURCE BREEZE) liquid 1 Container, 1 Container, Oral, TID BM, Jovita Kussmaul, MD, 1 Container at 56/38/75 6433   folic acid (FOLVITE) tablet 1 mg, 1 mg, Oral, Daily, Shade, Christine E, RPH, 1 mg at 12/10/21 0946   lip balm (CARMEX) ointment 1 application, 1 application, Topical, PRN, [name], PA-C   MEDLINE mouth rinse, 15 mL, Mouth Rinse, q12n4p, [name], MD, 15 mL at 12/08/21 1327   megestrol (MEGACE) 400 MG/10ML suspension 400 mg, 400 mg, Oral, Daily, Polly Cobia, RPH, 400 mg at 12/10/21 0945   multivitamin with minerals tablet 1 tablet, 1 tablet, Oral, Daily, Randa Spike, RPH, 1 tablet at 12/10/21 2951   nutrition supplement (JUVEN) (JUVEN) powder packet 1 packet, 1 packet, Oral, BID BM, Shade,  Haze Justin, RPH, 1 packet at 12/09/21 1559   ondansetron (ZOFRAN-ODT) disintegrating tablet 4 mg, 4 mg, Oral, Q6H PRN **OR** ondansetron (ZOFRAN) injection 4 mg, 4 mg, Intravenous, Q6H PRN, [name], MD   oxyCODONE (Oxy IR/ROXICODONE) immediate release tablet 2.5 mg, 2.5 mg, Oral, Q8H PRN, Romana Juniper A, MD, 2.5 mg at 12/04/21 0851   pantoprazole (PROTONIX) EC tablet 40 mg, 40 mg, Oral, QHS, Arlyn Dunning M, RPH, 40 mg at 12/09/21 2141   psyllium (HYDROCIL/METAMUCIL) 1 packet, 1 packet, Oral, BID, Ileana Roup, MD, 1 packet at 12/08/21 2315   sodium chloride flush (NS) 0.9 % injection 10-40 mL, 10-40 mL, Intracatheter, Q12H, Eugenie Filler, MD, 10 mL at 12/05/21 2300   sodium chloride flush (NS) 0.9 % injection 10-40 mL, 10-40 mL, Intracatheter, PRN, Eugenie Filler, MD   thiamine tablet 100 mg, 100 mg, Oral, Daily, Shade, Christine E, RPH, 100 mg at 88/41/66 0630   TPN CYCLIC-ADULT (ION), , Intravenous, Cyclic-See Admin Instructions, Randa Spike, RPH, Last Rate: 59 mL/hr at 12/09/21 1827, Rate Change at 12/09/21 1827  Exam: Current vital signs: BP 102/81 (BP Location: Left Arm)    Pulse 86    Temp 98 F (36.7 C) (Oral)    Resp 16    Ht 5\' 10"  (1.778 m)    Wt 74.5 kg    SpO2 100%    BMI 23.57 kg/m  Vital signs in last 24 hours: Temp:  [98 F (36.7 C)-98.7 F (37.1 C)] 98 F (36.7 C) (12/20 0603) Pulse Rate:  [86-98] 86 (12/20 0603) Resp:  [16] 16 (12/20 0603) BP: (102-115)/(60-81) 102/81 (12/20 0603) SpO2:  [97 %-100 %] 100 % (12/20 0603) Weight:  [74.5 kg] 74.5 kg (12/20 0500)  PE: GENERAL: Fairly well appearing female. Awake, alert, NAD.  HEENT: normocephalic and atraumatic. LUNGS - Normal respiratory effort.  CV - RRR on tele. ABDOMEN - Soft, nontender. Ext: warm, well perfused. Psych: affect light.   NEURO:  Mental Status: Alert and oriented to name. Disoriented to age (67), hospital ([facility]), state ([place]), day, date, month (October),  holiday next weekend (New Years), and year (2025).  Unable to tell NP how many quarters are in $2.75.  Speech/Language: speech is without dysarthria or aphasia.  Naming, repetition, fluency, and comprehension intact but bradyphrenic.  Cranial Nerves:  II: PERRL 58mm/brisk. visual fields full.  III, IV, VI: EOMI. Lid elevation symmetric and full.  V: sensation is intact and symmetrical to face.  VII: Smile is symmetrical.  VIII:hearing intact to voice. IX, X: palate elevation is symmetric. Phonation normal.  XI: normal sternocleidomastoid and trapezius muscle strength. ZSW:FUXNAT is symmetrical without fasciculations.   Motor:  5/5 throughout.  Tone is normal. Bulk is normal.  Sensation- Intact to light touch bilaterally in all four extremities. Extinction absent to DSS.  Coordination: FTN intact bilaterally and needs coaching to perform. HKS ataxic and she needs coaching to perform.   DTRs:  2+ throughout.  Gait- deferred for safety reasons.  Labs I have reviewed labs in epic and the results pertinent to this consultation are: Mg, Phos, Vitamin B12, TSH normal.   CBC    Component Value Date/Time   WBC 8.2 12/02/2021 0959   RBC 3.08 (L) 12/02/2021 0959   HGB 9.3 (L) 12/02/2021 0959   HCT 28.5 (L) 12/02/2021 0959   PLT 594 (H) 12/02/2021 0959   MCV 92.5 12/02/2021 0959   MCH 30.2 12/02/2021 0959   MCHC 32.6 12/02/2021 0959   RDW 16.1 (H) 12/02/2021 0959   LYMPHSABS 1.3 11/29/2021 0522   MONOABS 1.5 (H) 11/29/2021 0522   EOSABS 0.5 11/29/2021 0522   BASOSABS 0.1 11/29/2021 0522    CMP     Component Value Date/Time   NA 137 12/09/2021 0354   K 4.0 12/09/2021 0354   CL 106 12/09/2021 0354   CO2 27 12/09/2021 0354   GLUCOSE 123 (H) 12/09/2021 0354   BUN 24 (H) 12/09/2021 0354   CREATININE 0.44 12/09/2021 0354   CALCIUM 10.4 (H) 12/09/2021 0354   PROT 7.6 12/09/2021 0354   ALBUMIN 2.4 (L) 12/09/2021 0354   AST 19 12/09/2021 0354   ALT 16 12/09/2021 0354   ALKPHOS 84  12/09/2021 0354   BILITOT 0.4 12/09/2021 0354   GFRNONAA >60 12/09/2021 0354   Lipid Panel     Component Value Date/Time   TRIG 64 12/09/2021 0354    Assessment: 65 yo female with long hospital stay secondary to surgical correction of perforated viscus. Her appetite has been decreased with addition of TPN and Megace. Her PT note with ataxia in walking and sitting. As short time weight loss can cause Thiamine deficiency, will increase dose. It is reasonable to check MRI brain due to ataxia, but no other signs of stroke. She is confused and slow with comprehension which could be Vitamin related and also due to long hospital stay.    Recommendations/Plan:  -MRI brain. -One round of high dose Thiamine, then thiamine 100mg  po bid ongoing.  -Continue to supplement nutrition and encourage intake.  -Continue PT.  -Fall precautions.  -Daily weights.  -Will follow.   Pt seen by Clance Boll, NP/Neuro and later by MD. Note/plan to be edited by MD as needed.  Pager: [phone]  I have seen the patient and reviewed the above note.  When I am checking drift, she does have some mild drift that looks more like proprioceptive loss then weakness.  We will need to image her MRI of her brain, I will add a cervical spine as well to check for sensory ataxia causes (e.g. dorsal column dysfunction).  Currently, deconditioning in the setting of delirium certainly playing a role.  When I examined her, I did not see any clear ataxia affiliation, though getting her to cooperate with it was fairly difficult.  We will get the imaging of her brain and cervical spine, with further recommendations to follow  Roland Rack, MD [facility] [phone]  If 7pm- 7am, please page neurology on call as listed in [place].

## 2021-12-10 NOTE — Consult Note (Signed)
[facility] Nurse ostomy follow up Stoma type/location:  RLQ: ileostomy LLQ: mucous fistula  Stomal assessment/size:  RLQ; 1 1/4" resized today; pink and moist, small granulomas noted at 11 o'clock LLQ; 7/8" x 1 3/4" resized today; oval shaped; yellow/brownish texture to MF  Peristomal assessment: intact at both stomas; very slight MC separate at the site of the MF Treatment options for stomal/peristomal skin: skin barrier ring for each stoma  Output liquid yellow from ileostomy; scant drainage-MF Ostomy pouching: 1pc. Flat to the MF/2pc 2 1/4" with skin barrier ring to the ileostomy  Education provided:  NA; patient is not able to learn to care for her stomas. She asked again when her ostomies will "heal up today"  Enrolled patient in [facility] Start Discharge program: Yes  [facility] team following weekly for pouch changes. Pending DC to SNF  [place], [place], [place]

## 2021-12-10 NOTE — Progress Notes (Signed)
PHARMACY - TOTAL PARENTERAL NUTRITION CONSULT NOTE   Indication:  Inability to meet enteral needs  Patient Measurements: Height: '5\' 10"'  (177.8 cm) Weight: 74.5 kg (164 lb 3.9 oz) IBW/kg (Calculated) : 68.5 TPN AdjBW (KG): 79.8 Body mass index is 23.57 kg/m. Usual Weight: 80 kg  Assessment: 65 y.o. 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 She is now extended right colectomy and ileostomy. She developed acute metabolic encephalopathy post-op. She was initiated on TF, but tube became clogged on 12/8 and patient refused replacement, and is not eating enough to maintain nutrition.  Calorie count has been ordered.  CCS ordered TPN to start on 12/12, pharmacy to manage.  Glucose / Insulin: No hx DM. CBGs d/c on 12/16, resumed 58/25 with cyclic TPN. - CBGs 121-139.  Continue to monitor during cycling of TPN, but d/c if remains stable. Electrolytes: CorrCa (11.68) remains elevated despite removing Ca from TPN on 12/15. Other lytes WNL. Last labs on 12/19 Renal: SCr < 1 stable, BUN 24 Hepatic: LFTs, Alk Phos, T.bili all WNL. Albumin low Trig: WNL Intake / Output: Strict I/O not measured.  -UOP 1200 mL, stool 200 mL MIVF: none GI Imaging: GI Surgeries / Procedures:  11/27: Expiratory laparotomy, extended right colectomy, end ileostomy  Central access: PICC placed 12/11 TPN start date: 12/12  Nutritional Goals: Goal TPN rate is 80 mL/hr (provides 105 g of protein and 1977 kcals per day) 18/98 1/2 rate cyclic TPN: 4210 mL provides 54g protein and 1094 kcal per day  RD Assessment: Estimated Needs Total Energy Estimated Needs: 1950-2150 Total Protein Estimated Needs: 95-105g Total Fluid Estimated Needs: 2L/day  Current Nutrition:  Regular diet (0% meal intake charted) Boost  or Breeze TID Juven BID (190 kcal, 5g protein)  TPN  Plan:  Patient currently on TPN at 1/2 of goal rate, changing to cyclic TPN on 31/28.    At 11:88:  Advance cyclic TPN to cycle over 16 hours (1000 mL) with 1 hour taper up/down Electrolytes in TPN. No change Na 29mq/L K 524m/L Ca 39m29mL Mg 5mE48m Phos 15mm93m Cl:Ac 1:2 Continue multivitamin PO and thiamine 100 mg PO per RD CBG checks q6h while starting cyclic TPN  IVF per MD - none currently Monitor TPN labs on Mon/Thurs   [name], BCPS Clinical Pharmacist [facility] main pharmacy [phone]/2022 10:02 AM

## 2021-12-10 NOTE — Progress Notes (Signed)
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 Harlow Asa 11/27  - CT 12/4 w/ trace peripancreatic and right paracolic gutter simple free fluid that extends inferiorly into the right pelvis with associated peritoneal wall enhancement/thickening. No organized drainable fluid collection.   - Restarted abx 12/5. WBC normalized. Abx stopped.  - Minimal oral intake. Was on cortrak + TF 12/7-8. Cortrak became clogged and patient refused replacement 12/8.  - Ileostomy output improved. Off Imodium. Cont BID metamucil  - WOC following for ileostomy, mucous fistula; d/c wound vac 12/14. and start wet to dry given air bubbles noted in wound bed. Monitor.  - purulent drainage in wound noted, increase dressing change to TID  - Mobilize. PT/OT recommend SNF, TOC consulted  - Pathology benign.   OT comments  Patient was educated on log rolling techniques for bed mobility. Patient was max A for rolling to each side in bed with max cues for proper hand and foot placement. Patient was min/a for sitting on edge of bed with posterior leaning especially with cervical extension. Patient was unable to carryover self righting strategies between LOB sitting on edge of bed. Patient's discharge plan remains appropriate at this time. OT will continue to follow acutely.     Recommendations for follow up therapy are one component of a multi-disciplinary discharge planning process, led by the attending physician.  Recommendations may be updated based on patient status, additional functional criteria and insurance authorization.    Follow Up Recommendations  Skilled nursing-short term rehab (<3 hours/day)    Assistance Recommended at Discharge Frequent or constant Supervision/Assistance  Equipment  Recommendations  Other (comment) (defer to next venue)    Recommendations for Other Services      Precautions / Restrictions Precautions Precautions: Fall Precaution Comments: EXPLORATORY LAPAROTOMY, EXTENDED RIGHT COLECTOMY, END ILEOSTOMY, MUCOUS FISTULA (Abdomen)  PARTIAL COLECTOMY Restrictions Weight Bearing Restrictions: No       Mobility Bed Mobility                    Transfers                         Balance Overall balance assessment: Needs assistance Sitting-balance support: Feet supported;Bilateral upper extremity supported Sitting balance-Leahy Scale: Poor   Postural control: Posterior lean                                 ADL either performed or assessed with clinical judgement   ADL Overall ADL's : Needs assistance/impaired Eating/Feeding: Set up;Bed level;Sitting Eating/Feeding Details (indicate cue type and reason): patient was set up in bed for self feeding tasks.                                   General ADL Comments: patient participated in sitting on edge of bed with min/mod A for sitting balance with patient having strong posterior leaning with cervical extension to look at therapist. patient was educated on the importance of leaning forwards when looking up to reduce posterior leaning. patient was noted to have limited attempts at self righting when physical assist was reduced on patient.    Extremity/Trunk Assessment  Vision       Perception     Praxis      Cognition Arousal/Alertness: Awake/alert Behavior During Therapy: WFL for tasks assessed/performed Overall Cognitive Status: Impaired/Different from baseline                                            Exercises     Shoulder Instructions       General Comments      Pertinent Vitals/ Pain       Pain Assessment: No/denies pain  Home Living                                           Prior Functioning/Environment              Frequency  Min 2X/week        Progress Toward Goals  OT Goals(current goals can now be found in the care plan section)  Progress towards OT goals: Progressing toward goals     Plan Discharge plan remains appropriate    Co-evaluation                 AM-PAC OT "6 Clicks" Daily Activity     Outcome Measure   Help from another person eating meals?: A Little Help from another person taking care of personal grooming?: A Lot Help from another person toileting, which includes using toliet, bedpan, or urinal?: Total Help from another person bathing (including washing, rinsing, drying)?: A Lot Help from another person to put on and taking off regular upper body clothing?: A Little Help from another person to put on and taking off regular lower body clothing?: A Lot 6 Click Score: 13    End of Session    OT Visit Diagnosis: Unsteadiness on feet (R26.81);Other symptoms and signs involving cognitive function;Pain;History of falling (Z91.81);Repeated falls (R29.6);Muscle weakness (generalized) (M62.81)   Activity Tolerance Patient tolerated treatment well   Patient Left in bed;with call bell/phone within reach;with bed alarm set   Nurse Communication Mobility status        Time: 1130-1151 OT Time Calculation (min): 21 min  Charges: OT General Charges $OT Visit: 1 Visit OT Treatments $Therapeutic Activity: 8-22 mins  Jackelyn Poling OTR/L, MS Acute Rehabilitation Department Office# [phone] Pager# [phone]   Marcellina Millin 12/10/2021, 12:15 PM

## 2021-12-11 ENCOUNTER — Inpatient Hospital Stay (HOSPITAL_COMMUNITY): Payer: Medicare HMO

## 2021-12-11 DIAGNOSIS — I739 Peripheral vascular disease, unspecified: Secondary | ICD-10-CM | POA: Diagnosis not present

## 2021-12-11 DIAGNOSIS — R410 Disorientation, unspecified: Secondary | ICD-10-CM

## 2021-12-11 DIAGNOSIS — R29818 Other symptoms and signs involving the nervous system: Secondary | ICD-10-CM | POA: Diagnosis not present

## 2021-12-11 DIAGNOSIS — G9389 Other specified disorders of brain: Secondary | ICD-10-CM | POA: Diagnosis not present

## 2021-12-11 LAB — GLUCOSE, CAPILLARY
Glucose-Capillary: 101 mg/dL — ABNORMAL HIGH (ref 70–99)
Glucose-Capillary: 107 mg/dL — ABNORMAL HIGH (ref 70–99)
Glucose-Capillary: 125 mg/dL — ABNORMAL HIGH (ref 70–99)
Glucose-Capillary: 128 mg/dL — ABNORMAL HIGH (ref 70–99)

## 2021-12-11 MED ORDER — TRAVASOL 10 % IV SOLN
INTRAVENOUS | Status: AC
  Filled 2021-12-11: qty 540

## 2021-12-11 NOTE — Progress Notes (Signed)
PHARMACY - TOTAL PARENTERAL NUTRITION CONSULT NOTE   Indication:  Inability to meet enteral needs  Patient Measurements: Height: 5' 10" (177.8 cm) Weight: 74.5 kg (164 lb 3.9 oz) IBW/kg (Calculated) : 68.5 TPN AdjBW (KG): 79.8 Body mass index is 23.57 kg/m. Usual Weight: 80 kg  Assessment: 65 y.o. 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 She is now extended right colectomy and ileostomy. She developed acute metabolic encephalopathy post-op. She was initiated on TF, but tube became clogged on 12/8 and patient refused replacement, and is not eating enough to maintain nutrition.  Calorie count has been ordered.  CCS ordered TPN to start on 12/12, pharmacy to manage.  Glucose / Insulin: No hx DM. CBGs d/c on 12/16, resumed 86/75 with cyclic TPN. - CBGs 109-128.  Continue to monitor during cycling of TPN, but d/c if remains stable. Electrolytes: Lytes WNL except CorrCa (11.68) remains elevated despite removing Ca from TPN on 12/15. Last labs on 12/19 Renal: SCr < 1 stable, BUN 24 Hepatic: LFTs, Alk Phos, T.bili all WNL. Albumin low Trig: WNL Intake / Output: net I/O -879 -UOP 2200 mL, stool 360 mL MIVF: none GI Imaging: GI Surgeries / Procedures:  11/27: Expiratory laparotomy, extended right colectomy, end ileostomy  Central access: PICC placed 12/11 TPN start date: 12/12  Nutritional Goals: Goal TPN rate is 80 mL/hr (provides 105 g of protein and 1977 kcals per day) 44/92 half-rate cyclic TPN: 0100 mL provides 54g protein and 1094 kcal per day  RD Assessment: Estimated Needs Total Energy Estimated Needs: 1950-2150 Total Protein Estimated Needs: 95-105g Total Fluid Estimated Needs: 2L/day  Current Nutrition:  Regular diet (0% meal intake charted) Nutritional  supplements:  Boost or Breeze TID, Juven BID TPN - reduced to half rate cycled TPN in attempt to stimulate appetite during the day.    Plan:  At 71:21:  Advance cyclic TPN to cycle over 14 hours (1000 mL) with 1 hour taper up/down Electrolytes in TPN. No change Na 53mq/L K 574m/L Ca 16m69mL Mg 5mE66m Phos 15mm66m Cl:Ac 1:2 Continue multivitamin PO Thiamine per neurology CBG checks q6h while starting cyclic TPN  IVF per MD - none currently Monitor TPN labs on Mon/Thurs   [name], BCPS Clinical Pharmacist [facility] main pharmacy [phone]/2022 7:11 AM

## 2021-12-11 NOTE — Progress Notes (Addendum)
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 Harlow Asa 11/27  - CT 12/4 w/ trace peripancreatic and right paracolic gutter simple free fluid that extends inferiorly into the right pelvis with associated peritoneal wall enhancement/thickening. No organized drainable fluid collection.   - Restarted abx 12/5. WBC normalized. Abx stopped.  - Minimal oral intake. Was on cortrak + TF 12/7-8. Cortrak became clogged and patient refused replacement 12/8.  - Ileostomy output improved. Off Imodium. Cont BID metamucil  - WOC following for ileostomy, mucous fistula; d/c wound vac 12/14. and start wet to dry given air bubbles noted in wound bed. Monitor.  - purulent drainage in wound noted, increase dressing change to TID  - Mobilize. PT/OT recommend SNF, TOC consulted  - Pathology benign.    PT Comments    Pt is not progressing well with her mobility.  General Comments: Appears more alert but then says something totally off.  She is following commands but requires repeat instruction tio stay on task.   Getting OOB to [facility] and amb was very difficult.  General transfer comment: pt still requiring + 2 assist to stand and transfer.  Severe posterior lean and difficulty weight shifting/stepping/pivoting.  Required hand over hand correction and tactile cueing to hips to complete 1/4 pivot. General Gait Details: attempted forward amb with + 2 asisst and use EVA walker with third assist following with recliner.  Pt rigid, unable to weight shift, unable to off load and unable to take functional steps.  Very short shuffled steps.  Max c/o weakness. Positioned in recliner to comfort.   Appreciate Neurology's assessment.  Pt was living home alone and Indep prior to admit.  Will need Rehab before safely  returning home.     Recommendations for follow up therapy are one component of a multi-disciplinary discharge planning process, led by the attending physician.  Recommendations may be updated based on patient status, additional functional criteria and insurance authorization.  Follow Up Recommendations  CIR     Assistance Recommended at Discharge Frequent or constant Supervision/Assistance  Equipment Recommendations  None recommended by PT    Recommendations for Other Services       Precautions / Restrictions Precautions Precautions: Fall Precaution Comments: EXPLORATORY LAPAROTOMY, EXTENDED RIGHT COLECTOMY, END ILEOSTOMY, MUCOUS FISTULA (Abdomen)  PARTIAL COLECTOMY post Op AMS     Mobility  Bed Mobility Overal bed mobility: Needs Assistance Bed Mobility: Supine to Sit   Sidelying to sit: Max assist;+2 for physical assistance;HOB elevated;Mod assist       General bed mobility comments: pt still required + 2 assist with difficulty self righting to midline and self correcting her posterior lean.    Transfers Overall transfer level: Needs assistance Equipment used: None Transfers: Bed to chair/wheelchair/BSC Sit to Stand: +2 safety/equipment;+2 physical assistance;Max assist Stand pivot transfers: Max assist;Total assist;+2 physical assistance;+2 safety/equipment         General transfer comment: pt still requiring + 2 assist to stand and transfer.  Severe posterior lean and difficulty weight shifting/stepping/pivoting.  Required hand over hand correction and tactile cueing to hips to complete 1/4 pivot.    Ambulation/Gait Ambulation/Gait assistance: Max assist;Total assist;+2 physical assistance;+2 safety/equipment Gait Distance (Feet): 2 Feet Assistive device: Eva Walker;Bilateral platform walker Gait Pattern/deviations: Step-through pattern;Decreased stride length;Shuffle;Drifts right/left;Trunk flexed Gait velocity: decreased  General Gait Details: attempted  forward amb with + 2 asisst and use EVA walker with third assist following with recliner.  Pt rigid, unable to weight shift, unable to off load and unable to take functional steps.  Very short shuffled steps.  Max c/o weakness.   Stairs             Wheelchair Mobility    Modified Rankin (Stroke Patients Only)       Balance                                            Cognition Arousal/Alertness: Awake/alert Behavior During Therapy: WFL for tasks assessed/performed Overall Cognitive Status: Impaired/Different from baseline Area of Impairment: Orientation;Attention;Memory;Following commands;Safety/judgement;Awareness;Problem solving                 Orientation Level: Situation;Time;Disoriented to     Following Commands: Follows one step commands inconsistently Safety/Judgement: Decreased awareness of safety;Decreased awareness of deficits     General Comments: Appears more alert but then says something totally off.  She is following commands but requires repeat instruction tio stay on task.        Exercises      General Comments        Pertinent Vitals/Pain      Home Living                          Prior Function            PT Goals (current goals can now be found in the care plan section) Progress towards PT goals: Progressing toward goals    Frequency    Min 2X/week      PT Plan Current plan remains appropriate    Co-evaluation              AM-PAC PT "6 Clicks" Mobility   Outcome Measure  Help needed turning from your back to your side while in a flat bed without using bedrails?: A Lot Help needed moving from lying on your back to sitting on the side of a flat bed without using bedrails?: A Lot Help needed moving to and from a bed to a chair (including a wheelchair)?: A Lot Help needed standing up from a chair using your arms (e.g., wheelchair or bedside chair)?: A Lot Help needed to walk in hospital  room?: Total Help needed climbing 3-5 steps with a railing? : Total 6 Click Score: 10    End of Session Equipment Utilized During Treatment: Gait belt Activity Tolerance: Treatment limited secondary to medical complications (Comment) Patient left: with call bell/phone within reach;with family/visitor present;with nursing/sitter in room;in chair;with chair alarm set Nurse Communication: Mobility status PT Visit Diagnosis: Muscle weakness (generalized) (M62.81);Difficulty in walking, not elsewhere classified (R26.2)     Time: 2956-2130 PT Time Calculation (min) (ACUTE ONLY): 27 min  Charges:  $Gait Training: 8-22 mins $Therapeutic Activity: 8-22 mins                     {Nicholus Chandran  PTA Acute  Rehabilitation Services Pager      [phone] Office      [phone]

## 2021-12-11 NOTE — Progress Notes (Signed)
Pt refused MRI of the C-spine. Per pt. "There is nothing wrong with my neck and I know this is expensive. MRI brain was completed. C-spine exam being cancelled out due to pt request.

## 2021-12-11 NOTE — Progress Notes (Signed)
Inpatient Rehab Admissions Coordinator:   Per updated therapy notes chart was screened for appropriateness for CIR.  Note per therapy and TOC pt has no support at discharge, which would be necessary to facilitate a rehab admission given her confusion.  [employer] unlikely to approve CIR for this patient, and they certainly will not approve SNF following a CIR admission.  Feel SNF is more appropriate venue at this time.   Shann Medal, PT, DPT Admissions Coordinator [phone] 12/11/21  3:31 PM

## 2021-12-11 NOTE — Progress Notes (Addendum)
Neurology Progress Note  Brief HPI: 65 y.o. female with PMHx of anemia, RA, cataract, GERD, and HLD who initially presented to [facility] 11/17/21 with abdominal pain and found to have a perforated viscus s/p surgical repair. Patient's prolonged hospitalization has been complicated by severe malnutrition related to acute illness on Thiamine replacement and TPN. She reports having no appetite and PT has ongoing concerns for incoordination and ataxia and neurology was consulted for further evaluation.   Subjective: MRI brain complete, official read pending Patient does not have any acute complaints, no acute overnight events. When asked if she could be assessed she stated " No, you are stealing from the garden [place]".  When asked if she felt confused she said no.  Patient declined MRI Spine at this time.   Exam: Vitals:   12/11/21 0515 12/11/21 1334  BP: 125/85 121/71  Pulse: 89   Resp: 16 16  Temp: 97.6 F (36.4 C) (!) 97.5 F (36.4 C)  SpO2: 99% 99%   Gen: In bed, NAD Resp: non-labored breathing, no acute distress Abd: soft, nt  Neuro: Mental Status: alert and disoriented to time. Pt stated it was April 2025. Delirium noted. Patient is able to state her name and location without difficulty but when asked if she is feeling confused she denies confusion. When asked to assess patient she states "it is not necessary, you're just stealing from the garden of [place]".  Speech is intact without dysarthria or aphsia. Some limited attention noted No neglect noted Cranial Nerves: PERRL, EOMI without ptosis or gaze preference, face is symmetric resting and with movement, hearing is intact to voice, phonation intact, tongue and palate are midline.  Motor: moves all extremities antigravity and without vertical drift with right arm strength assessment impaired 2/2 soreness due to injury. Sensory: Intact and symmetric to light touch bilaterally DTR: 3+ bilateral patellae and biceps Gait: Deferred for  patient safety  Pertinent Labs: CBC    Component Value Date/Time   WBC 10.1 12/10/2021 1208   RBC 3.46 (L) 12/10/2021 1208   HGB 10.0 (L) 12/10/2021 1208   HCT 31.9 (L) 12/10/2021 1208   PLT 406 (H) 12/10/2021 1208   MCV 92.2 12/10/2021 1208   MCH 28.9 12/10/2021 1208   MCHC 31.3 12/10/2021 1208   RDW 15.8 (H) 12/10/2021 1208   LYMPHSABS 1.3 11/29/2021 0522   MONOABS 1.5 (H) 11/29/2021 0522   EOSABS 0.5 11/29/2021 0522   BASOSABS 0.1 11/29/2021 0522   CMP     Component Value Date/Time   NA 137 12/09/2021 0354   K 4.0 12/09/2021 0354   CL 106 12/09/2021 0354   CO2 27 12/09/2021 0354   GLUCOSE 123 (H) 12/09/2021 0354   BUN 24 (H) 12/09/2021 0354   CREATININE 0.44 12/09/2021 0354   CALCIUM 10.4 (H) 12/09/2021 0354   PROT 7.6 12/09/2021 0354   ALBUMIN 2.4 (L) 12/09/2021 0354   AST 19 12/09/2021 0354   ALT 16 12/09/2021 0354   ALKPHOS 84 12/09/2021 0354   BILITOT 0.4 12/09/2021 0354   GFRNONAA >60 12/09/2021 0354     Imaging Reviewed:  MRI brain W/O- No acute intracranial pathology.Mild-to-moderate global parenchymal volume loss and chronic white matter microangiopathy.  Assessment: 65 year old female with past medical history of anemia, RA, cataract, GERD and HLD seen today at bedside for assessment. Patient alert and disoriented, likely hospital delirium caused by prolonged hospitalization, sleep wake disturbance, nutritional deficiencies and prior critical illness. Examination is without obvious ataxia and is non-focal other  than confusion and presentation is felt to be most consistent with delirium. MRI WO acute intracranial pathology to suggest organic cause for lack of progression with PT.   Recommendations: 1) Continue vitamin supplements as ordered 2) Continue Megace and TPN per primary team 3) Continue vitamin supplements as ordered 4) Try to minimize deliriogenic medications as much as possible (J Am Geriatr Soc. 2012 Apr;60(4):616-31): benzodiazepines,  anticholinergics, diphenhydramine, antihistamines, narcotics, Ambien/Lunesta/Sonata etc. 5) environmental support for delirium: Lights on during the day, patient up and out of bed as much as is feasible, OT/PT, quiet dimly lit room at night, reorient patient often, provide hearing aides and glasses if patient uses them routinely, minimize sleep disruptions as much as possible overnight. 6) No further inpatient neurology recommendations please contact neurologist on call for further questions or concerns.   Parke Poisson, AGPCNP-BC [facility]

## 2021-12-11 NOTE — Progress Notes (Signed)
Patient ID: Sherry Hampton, female   DOB: 10-19-1956, 65 y.o.   MRN: [id number]   Acute Care Surgery Service Progress Note:    Chief Complaint/Subjective: No abd pain States she drank 3 shakes yesterday   Objective: Vital signs in last 24 hours: Temp:  [97.6 F (36.4 C)-98.1 F (36.7 C)] 97.6 F (36.4 C) (12/21 0515) Pulse Rate:  [82-89] 89 (12/21 0515) Resp:  [16-18] 16 (12/21 0515) BP: (103-125)/(70-89) 125/85 (12/21 0515) SpO2:  [99 %-100 %] 99 % (12/21 0515) Last BM Date: 12/11/21  Intake/Output from previous day: 12/20 0701 - 12/21 0700 In: 1680.7 [P.O.:360; I.V.:1170.7; IV Piggyback:150] Out: 2560 [Urine:2200; Stool:360] Intake/Output this shift: Total I/O In: 421.9 [P.O.:240; I.V.:181.9] Out: 56 [Stool:75]  General: pleasant, WD, elderly female who is in bed and in NAD Heart: regular, rate, and rhythm.   Lungs: Respiratory effort nonlabored Abd: soft, NT, ND, +BS, mucus fistula viable with some thin drainage, ileostomy viable with stool and gas in ostomy appliance, midline wound with mostly healthy granulation tissue and some purulent appearing drainage inferiorly MS: all 4 extremities are symmetrical with no cyanosis, clubbing, or edema.  Lab Results: CBC  Recent Labs    12/10/21 1208  WBC 10.1  HGB 10.0*  HCT 31.9*  PLT 406*   BMET Recent Labs    12/09/21 0354  NA 137  K 4.0  CL 106  CO2 27  GLUCOSE 123*  BUN 24*  CREATININE 0.44  CALCIUM 10.4*   LFT Hepatic Function Latest Ref Rng & Units 12/09/2021 12/07/2021 12/06/2021  Total Protein 6.5 - 8.1 g/dL 7.6 8.1 7.7  Albumin 3.5 - 5.0 g/dL 2.4(L) 2.5(L) 2.3(L)  AST 15 - 41 U/L '19 20 20  ' ALT 0 - 44 U/L '16 13 13  ' Alk Phosphatase 38 - 126 U/L 84 86 96  Total Bilirubin 0.3 - 1.2 mg/dL 0.4 0.3 0.4  Bilirubin, Direct 0.0 - 0.2 mg/dL - - -   PT/INR No results for input(s): LABPROT, INR in the last 72 hours. ABG No results for input(s): PHART, HCO3 in the last 72 hours.  Invalid input(s): PCO2,  PO2  Studies/Results:  Anti-infectives: Anti-infectives (From admission, onward)    Start     Dose/Rate Route Frequency Ordered Stop   11/25/21 1000  piperacillin-tazobactam (ZOSYN) IVPB 3.375 g  Status:  Discontinued        3.375 g 12.5 mL/hr over 240 Minutes Intravenous Every 8 hours 11/25/21 0909 12/01/21 0822   11/17/21 1830  piperacillin-tazobactam (ZOSYN) IVPB 3.375 g        3.375 g 12.5 mL/hr over 240 Minutes Intravenous Every 8 hours 11/17/21 1415 11/22/21 2359   11/17/21 1230  piperacillin-tazobactam (ZOSYN) IVPB 3.375 g        3.375 g 100 mL/hr over 30 Minutes Intravenous  Once 11/17/21 1220 11/17/21 1304       Medications: Scheduled Meds:  acetaminophen  1,000 mg Oral Q6H   chlorhexidine  15 mL Mouth Rinse BID   Chlorhexidine Gluconate Cloth  6 each Topical Daily   enoxaparin (LOVENOX) injection  40 mg Subcutaneous Q24H   feeding supplement  1 Container Oral TID BM   feeding supplement  237 mL Oral TID BM   folic acid  1 mg Oral Daily   mouth rinse  15 mL Mouth Rinse q12n4p   mirtazapine  15 mg Oral QHS   multivitamin with minerals  1 tablet Oral Daily   nutrition supplement (JUVEN)  1 packet Oral BID BM   pantoprazole  40 mg Oral QHS   psyllium  1 packet Oral BID   sodium chloride flush  10-40 mL Intracatheter Q12H   [START ON 12/18/2021] thiamine  100 mg Oral BID   Continuous Infusions:  sodium chloride 10 mL/hr at 11/22/21 0434   thiamine injection 500 mg (12/11/21 0510)   Followed by   Derrill Memo ON 12/12/2021] thiamine injection     TPN CYCLIC-ADULT (ION)     PRN Meds:.sodium chloride, lip balm, ondansetron **OR** ondansetron (ZOFRAN) IV, oxyCODONE, sodium chloride flush  Assessment/Plan: Patient Active Problem List   Diagnosis Date Noted   Protein-calorie malnutrition, severe 11/26/2021   GERD (gastroesophageal reflux disease)    Hyperlipidemia    Hyponatremia    Acute blood loss anemia    Acute encephalopathy    Perforated abdominal viscus  11/17/2021   Perforated viscus 11/17/2021  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 Harlow Asa 11/27 - path: necrotic colon at site of perforation, no malignancy or dysplasia noted  - CT 12/4 w/ trace peripancreatic and right paracolic gutter simple free fluid that extends inferiorly into the right pelvis with associated peritoneal wall enhancement/thickening. No organized drainable fluid collection.  - Restarted abx 12/5. WBC normalized. Abx stopped. - Minimal oral intake. Was on cortrak + TF 12/7-8. Cortrak became clogged and patient refused replacement 12/8. - Ileostomy output improved. Off Imodium. Cont BID metamucil - WOC following for ileostomy, mucous fistula; d/c wound vac 12/14. and start wet to dry given air bubbles noted in wound bed. Monitor. - purulent drainage in wound noted, continue dressing change TID - Mobilize. PT/OT recommend SNF, TOC consulted - Pathology benign. CT distal descending/proximal sigmoid bowel luminal collapse -- radiology recommending colonoscopy as outpatient.    FEN - Regular diet as tolerated, ensure TID, TPN. Cycle TPN at night to try to stimulate appetite and continue megace BID VTE - SCDs, Lovenox ID - zosyn 11/27 - 12/2. 12/5-12/11 Foley - purewick, would prefer patient be getting up at least to [facility]   Encephalopathy/FTT - Patient has been on folic acid and thiamine supplements. Noted to have poor motor control and coordination the other day by PT which is different from last week per therapy notes, Neurology consulted -appreciated their input. MRI C spine and Brain  pending  Disposition:start calorie counts. Hopefully if oral intake is that good we can wean tpn over the next day or two but not sure how reliable her history is.   LOS: 24 days    Leighton Ruff. Redmond Pulling, MD, FACS General, Bariatric, & Minimally Invasive Surgery [phone] [facility]

## 2021-12-11 NOTE — Progress Notes (Signed)
Offgoing RN reported TPN was increased to 67 last night will decrease rate to 33 at 0830.

## 2021-12-12 LAB — COMPREHENSIVE METABOLIC PANEL
ALT: 14 U/L (ref 0–44)
AST: 16 U/L (ref 15–41)
Albumin: 2.4 g/dL — ABNORMAL LOW (ref 3.5–5.0)
Alkaline Phosphatase: 82 U/L (ref 38–126)
Anion gap: 3 — ABNORMAL LOW (ref 5–15)
BUN: 26 mg/dL — ABNORMAL HIGH (ref 8–23)
CO2: 27 mmol/L (ref 22–32)
Calcium: 10.5 mg/dL — ABNORMAL HIGH (ref 8.9–10.3)
Chloride: 109 mmol/L (ref 98–111)
Creatinine, Ser: 0.47 mg/dL (ref 0.44–1.00)
GFR, Estimated: >60 mL/min (ref >60)
Glucose, Bld: 125 mg/dL — ABNORMAL HIGH (ref 70–99)
Potassium: 3.7 mmol/L (ref 3.5–5.1)
Sodium: 139 mmol/L (ref 135–145)
Total Bilirubin: 0.3 mg/dL (ref 0.3–1.2)
Total Protein: 7.8 g/dL (ref 6.5–8.1)

## 2021-12-12 LAB — GLUCOSE, CAPILLARY
Glucose-Capillary: 130 mg/dL — ABNORMAL HIGH (ref 70–99)
Glucose-Capillary: 76 mg/dL (ref 70–99)
Glucose-Capillary: 114 mg/dL — ABNORMAL HIGH (ref 70–99)
Glucose-Capillary: 115 mg/dL — ABNORMAL HIGH (ref 70–99)

## 2021-12-12 LAB — MAGNESIUM: Magnesium: 2.1 mg/dL (ref 1.7–2.4)

## 2021-12-12 LAB — PHOSPHORUS: Phosphorus: 3 mg/dL (ref 2.5–4.6)

## 2021-12-12 MED ORDER — TRAVASOL 10 % IV SOLN
INTRAVENOUS | Status: AC
  Filled 2021-12-12: qty 540

## 2021-12-12 NOTE — Evaluation (Signed)
SLP Cancellation Note  Patient Details Name: Sherry Hampton MRN: [id number] DOB: 15-Oct-1956   Cancelled treatment:       Reason Eval/Treat Not Completed: Other (comment) (SLP to sign off at this time and forgo speech/cog eval as pt has delirum with decreased participation and her concerns.  DC is planned for SNF per notes.  If indicated, SLP can be ordered in SNF. Thanks.)  Kathleen Lime, MS [facility] SLP Acute Rehab Services Office [phone] Pager [phone]  Macario Golds 12/12/2021, 3:48 PM

## 2021-12-12 NOTE — Progress Notes (Signed)
Patient ID: Sherry Hampton, female   DOB: [birth year], 65 y.o.   MRN: [id number]   Acute Care Surgery Service Progress Note:    Chief Complaint/Subjective: No abd pain, not eating much per calorie counts   Objective: Vital signs in last 24 hours: Temp:  [97.5 F (36.4 C)-98.3 F (36.8 C)] 98.3 F (36.8 C) (12/22 0510) Pulse Rate:  [81-99] 81 (12/22 0510) Resp:  [16-18] 18 (12/22 0510) BP: (101-121)/(71-88) 101/80 (12/22 0510) SpO2:  [99 %] 99 % (12/22 0510) Last BM Date: 12/11/21  Intake/Output from previous day: 12/21 0701 - 12/22 0700 In: 1350.3 [P.O.:900; I.V.:300.3; IV Piggyback:150] Out: 8675 [Urine:2500; QGBEE:1007] Intake/Output this shift: No intake/output data recorded.  General: pleasant, WD, elderly female who is in bed and in NAD Abd: soft, NT, ND, mucus fistula viable with some thin drainage, ileostomy viable with stool and gas in ostomy appliance, midline wound with mostly healthy granulation tissue  MS: all 4 extremities are symmetrical with no cyanosis, clubbing, or edema.  Lab Results: CBC  Recent Labs    12/10/21 1208  WBC 10.1  HGB 10.0*  HCT 31.9*  PLT 406*    BMET Recent Labs    12/12/21 0432  NA 139  K 3.7  CL 109  CO2 27  GLUCOSE 125*  BUN 26*  CREATININE 0.47  CALCIUM 10.5*    LFT Hepatic Function Latest Ref Rng & Units 12/12/2021 12/09/2021 12/07/2021  Total Protein 6.5 - 8.1 g/dL 7.8 7.6 8.1  Albumin 3.5 - 5.0 g/dL 2.4(L) 2.4(L) 2.5(L)  AST 15 - 41 U/L '16 19 20  ' ALT 0 - 44 U/L '14 16 13  ' Alk Phosphatase 38 - 126 U/L 82 84 86  Total Bilirubin 0.3 - 1.2 mg/dL 0.3 0.4 0.3  Bilirubin, Direct 0.0 - 0.2 mg/dL - - -   PT/INR No results for input(s): LABPROT, INR in the last 72 hours. ABG No results for input(s): PHART, HCO3 in the last 72 hours.  Invalid input(s): PCO2, PO2  Studies/Results:  Anti-infectives: Anti-infectives (From admission, onward)    Start     Dose/Rate Route Frequency Ordered Stop   11/25/21 1000   piperacillin-tazobactam (ZOSYN) IVPB 3.375 g  Status:  Discontinued        3.375 g 12.5 mL/hr over 240 Minutes Intravenous Every 8 hours 11/25/21 0909 12/01/21 0822   11/17/21 1830  piperacillin-tazobactam (ZOSYN) IVPB 3.375 g        3.375 g 12.5 mL/hr over 240 Minutes Intravenous Every 8 hours 11/17/21 1415 11/22/21 2359   11/17/21 1230  piperacillin-tazobactam (ZOSYN) IVPB 3.375 g        3.375 g 100 mL/hr over 30 Minutes Intravenous  Once 11/17/21 1220 11/17/21 1304       Medications: Scheduled Meds:  acetaminophen  1,000 mg Oral Q6H   chlorhexidine  15 mL Mouth Rinse BID   Chlorhexidine Gluconate Cloth  6 each Topical Daily   enoxaparin (LOVENOX) injection  40 mg Subcutaneous Q24H   feeding supplement  1 Container Oral TID BM   feeding supplement  237 mL Oral TID BM   folic acid  1 mg Oral Daily   mouth rinse  15 mL Mouth Rinse q12n4p   mirtazapine  15 mg Oral QHS   multivitamin with minerals  1 tablet Oral Daily   nutrition supplement (JUVEN)  1 packet Oral BID BM   pantoprazole  40 mg Oral QHS   psyllium  1 packet Oral BID   sodium chloride flush  10-40 mL  Intracatheter Q12H   [START ON 12/18/2021] thiamine  100 mg Oral BID   Continuous Infusions:  sodium chloride 10 mL/hr at 11/22/21 0434   thiamine injection     PRN Meds:.sodium chloride, lip balm, ondansetron **OR** ondansetron (ZOFRAN) IV, oxyCODONE, sodium chloride flush  Assessment/Plan: Patient Active Problem List   Diagnosis Date Noted   Delirium    Protein-calorie malnutrition, severe 11/26/2021   GERD (gastroesophageal reflux disease)    Hyperlipidemia    Hyponatremia    Acute blood loss anemia    Acute encephalopathy    Perforated abdominal viscus 11/17/2021   Perforated viscus 11/17/2021  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 Harlow Asa 11/27 - path: necrotic colon at site of perforation, no malignancy or dysplasia  noted  - CT 12/4 w/ trace peripancreatic and right paracolic gutter simple free fluid that extends inferiorly into the right pelvis with associated peritoneal wall enhancement/thickening. No organized drainable fluid collection.  - Restarted abx 12/5. WBC normalized. Abx stopped. - Minimal oral intake. Was on cortrak + TF 12/7-8. Cortrak became clogged and patient refused replacement 12/8. - Ileostomy output improved. Off Imodium. Cont BID metamucil - WOC following for ileostomy, mucous fistula; d/c wound vac 12/14. and start wet to dry given air bubbles noted in wound bed. Monitor. continue dressing change TID - Mobilize. PT/OT recommend SNF, TOC consulted - Pathology benign. CT distal descending/proximal sigmoid bowel luminal collapse -- radiology recommending colonoscopy as outpatient.    FEN - Regular diet as tolerated, ensure TID, TPN. Cycle TPN at night to try to stimulate appetite and continue Mirtazapine   VTE - SCDs, Lovenox ID - zosyn 11/27 - 12/2. 12/5-12/11 Foley - purewick, would prefer patient be getting up at least to [facility]   Encephalopathy/FTT - Patient has been on folic acid and thiamine supplements. Noted to have poor motor control and coordination the other day by PT which is different from last week per therapy notes, Neurology consulted -appreciated their input. No further workup needed at this time  Disposition: Poor oral intake.  Unable to d/c TPN at this time   LOS: 25 days    Rosario Adie, MD  [facility] [place] [facility]

## 2021-12-12 NOTE — Progress Notes (Signed)
PHARMACY - TOTAL PARENTERAL NUTRITION CONSULT NOTE   Indication:  Inability to meet enteral needs  Patient Measurements: Height: _0  (177.8 cm) Weight: 74.5 kg (164 lb 3.9 oz) IBW/kg (Calculated) : 68.5 TPN AdjBW (KG): 79.8 Body mass index is 23.57 kg/m. Usual Weight: 80 kg  Assessment: 65 y.o. 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 She is now extended right colectomy and ileostomy. She developed acute metabolic encephalopathy post-op. She was initiated on TF, but tube became clogged on 12/8 and patient refused replacement, and is not eating enough to maintain nutrition.  Calorie count has been ordered.  CCS ordered TPN to start on 12/12, pharmacy to manage.  Glucose / Insulin: No hx DM. CBGs d/c on 12/16, resumed 90/38 with cyclic TPN. - CBGs 101-130.  d/c since glucose remains stable on cycled TPN. Electrolytes: Lytes WNL except CorrCa (11.78) remains elevated despite removing Ca from TPN on 12/15. Renal: SCr < 1 stable, BUN 26 Hepatic: LFTs, Alk Phos, T.bili all WNL. Albumin low Trig: WNL Intake / Output: net I/O -2.7L -UOP 2500 mL, stool 1625 mL MIVF: none GI Imaging: GI Surgeries / Procedures:  11/27: Expiratory laparotomy, extended right colectomy, end ileostomy  Central access: PICC placed 12/11 TPN start date: 12/12  Nutritional Goals: Goal TPN rate is 80 mL/hr (provides 105 g of protein and 1977 kcals per day) 33/38 half-rate cyclic TPN: 3291 mL provides 54g protein and 1094 kcal per day  RD Assessment: Estimated Needs Total Energy Estimated Needs: 1950-2150 Total Protein Estimated Needs: 95-105g Total Fluid Estimated Needs: 2L/day  Current Nutrition:  Regular diet (25% meal intake charted) Nutritional supplements:  Boost or Breeze TID, Juven  BID TPN - half rate cycled TPN in attempt to stimulate appetite during the day.   CCS 12/21:  start calorie counts. Hopefully if oral intake is that good we can wean tpn over the next day or two but not sure how reliable her history is.   Plan:  At 91:66:  Continue cyclic TPN over 14 hours (1000 mL) with 1 hour taper up/down Electrolytes in TPN. No change Na 32mq/L K 567m/L Ca 81m44mL Mg 5mE76m Phos 15mm37m Cl:Ac 1:2 Continue multivitamin PO Thiamine per neurology D/C CBG checks IVF per MD - none currently Monitor TPN labs on Mon/Thurs Follow up calorie counts and ability to meet nutritional needs with enteral nutrition.     [name], BCPS Clinical Pharmacist [facility] main pharmacy [phone]/2022 10:33 AM

## 2021-12-12 NOTE — Progress Notes (Addendum)
Nutrition Follow-up  DOCUMENTATION CODES:   Severe malnutrition in context of acute illness/injury  INTERVENTION:   -Calorie Count continues  -Cyclic TPN management per Pharmacy -Continue thiamine supplementation (dosage was increased by Neurology)   -Ensure Enlive po BID, each supplement provides 350 kcal and 20 grams of protein -Pt prefers this in strawberry   -Milk served with every meal tray (pt enjoys 2% -provides 120 kcals and 8g protein per carton)   -Juven, each serving provides 95kcal and 2.5g of protein (amino acids glutamine and arginine)   NUTRITION DIAGNOSIS:   Severe Malnutrition related to acute illness as evidenced by energy intake < or equal to 50% for > or equal to 5 days, moderate fat depletion, severe muscle depletion.  Ongoing.  GOAL:   Patient will meet greater than or equal to 90% of their needs  Progressing. TPN only meeting 56% of needs  MONITOR:   PO intake, Supplement acceptance, Labs, Weight trends, I & O's (TPN)  ASSESSMENT:   65 year old female who presents to the emergency department with sudden onset of abdominal pain and distention.CT scan of abdomen and pelvis shows findings consistent with free air with a large pneumoperitoneum.  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  11/27: s/p ex lap, right colectomy, ileostomy 12/11: PICC placed 12/12: TPN initiated  12/21 Calorie Count: Items consumed: 1 Juven supplement, 1/2 Ensure, 1/2 banana and 1 2% milk carton Totals: 435 kcals, 02R protein   Cyclic TPN continues x 14 hours, provides 1094 kcals and 54g protein.  If PO doesn't improve, will need to increase TPN, not meeting >90% of needs with TPN + PO currently.   Admission weight: 176 lbs. Current weight: 164 lbs.  Medications: Folic acid, Remeron, Multivitamin with minerals daily, Metamucil, IV thiamine  Labs reviewed:  CBGs: 76-130  Diet Order:   Diet Order             Diet regular Room  service appropriate? Yes; Fluid consistency: Thin  Diet effective now                   EDUCATION NEEDS:   Education needs have been addressed  Skin:  Skin Assessment: Skin Integrity Issues: Skin Integrity Issues:: Incisions Incisions: 11/27 abdomen  Last BM:  12/22 -ileostomy  Height:   Ht Readings from Last 1 Encounters:  11/17/21 5\' 10"  (1.778 m)    Weight:   Wt Readings from Last 1 Encounters:  12/10/21 74.5 kg    BMI:  Body mass index is 23.57 kg/m.  Estimated Nutritional Needs:   Kcal:  1950-2150  Protein:  95-105g  Fluid:  2L/day  Clayton Bibles, MS, RD, LDN Inpatient Clinical Dietitian Contact information available via Amion

## 2021-12-13 LAB — GLUCOSE, CAPILLARY
Glucose-Capillary: 136 mg/dL — ABNORMAL HIGH (ref 70–99)
Glucose-Capillary: 171 mg/dL — ABNORMAL HIGH (ref 70–99)
Glucose-Capillary: 93 mg/dL (ref 70–99)
Glucose-Capillary: 96 mg/dL (ref 70–99)

## 2021-12-13 MED ORDER — TRAVASOL 10 % IV SOLN
INTRAVENOUS | Status: AC
  Filled 2021-12-13: qty 540

## 2021-12-13 NOTE — Progress Notes (Signed)
Patient ID: Sherry Hampton, female   DOB: September 11, 1956, 65 y.o.   MRN: [id number]   Acute Care Surgery Service Progress Note:    Chief Complaint/Subjective: No abd pain, not eating much    Objective: Vital signs in last 24 hours: Temp:  [98.2 F (36.8 C)-98.5 F (36.9 C)] 98.3 F (36.8 C) (12/23 0456) Pulse Rate:  [84-99] 92 (12/23 0456) Resp:  [14-18] 18 (12/22 2101) BP: (101-132)/(73-120) 101/89 (12/23 0456) SpO2:  [98 %-99 %] 99 % (12/23 0456) FiO2 (%):  [16 %] 16 % (12/23 0456) Last BM Date: 12/12/21  Intake/Output from previous day: 12/22 0701 - 12/23 0700 In: 2987.8 [P.O.:1200; I.V.:1737.8; IV Piggyback:50] Out: 1194 [Urine:1400; RDEYC:1448] Intake/Output this shift: No intake/output data recorded.  General: pleasant, WD, elderly female who is in bed and in NAD Abd: soft, NT, ND, mucus fistula viable with some thin drainage, ileostomy viable with stool and gas in ostomy appliance, midline wound with mostly healthy granulation tissue  MS: all 4 extremities are symmetrical with no cyanosis, clubbing, or edema.  Lab Results: CBC  Recent Labs    12/10/21 1208  WBC 10.1  HGB 10.0*  HCT 31.9*  PLT 406*    BMET Recent Labs    12/12/21 0432  NA 139  K 3.7  CL 109  CO2 27  GLUCOSE 125*  BUN 26*  CREATININE 0.47  CALCIUM 10.5*    LFT Hepatic Function Latest Ref Rng & Units 12/12/2021 12/09/2021 12/07/2021  Total Protein 6.5 - 8.1 g/dL 7.8 7.6 8.1  Albumin 3.5 - 5.0 g/dL 2.4(L) 2.4(L) 2.5(L)  AST 15 - 41 U/L '16 19 20  ' ALT 0 - 44 U/L '14 16 13  ' Alk Phosphatase 38 - 126 U/L 82 84 86  Total Bilirubin 0.3 - 1.2 mg/dL 0.3 0.4 0.3  Bilirubin, Direct 0.0 - 0.2 mg/dL - - -   PT/INR No results for input(s): LABPROT, INR in the last 72 hours. ABG No results for input(s): PHART, HCO3 in the last 72 hours.  Invalid input(s): PCO2, PO2  Studies/Results:  Anti-infectives: Anti-infectives (From admission, onward)    Start     Dose/Rate Route Frequency Ordered  Stop   11/25/21 1000  piperacillin-tazobactam (ZOSYN) IVPB 3.375 g  Status:  Discontinued        3.375 g 12.5 mL/hr over 240 Minutes Intravenous Every 8 hours 11/25/21 0909 12/01/21 0822   11/17/21 1830  piperacillin-tazobactam (ZOSYN) IVPB 3.375 g        3.375 g 12.5 mL/hr over 240 Minutes Intravenous Every 8 hours 11/17/21 1415 11/22/21 2359   11/17/21 1230  piperacillin-tazobactam (ZOSYN) IVPB 3.375 g        3.375 g 100 mL/hr over 30 Minutes Intravenous  Once 11/17/21 1220 11/17/21 1304       Medications: Scheduled Meds:  acetaminophen  1,000 mg Oral Q6H   chlorhexidine  15 mL Mouth Rinse BID   Chlorhexidine Gluconate Cloth  6 each Topical Daily   enoxaparin (LOVENOX) injection  40 mg Subcutaneous Q24H   feeding supplement  1 Container Oral TID BM   feeding supplement  237 mL Oral TID BM   folic acid  1 mg Oral Daily   mouth rinse  15 mL Mouth Rinse q12n4p   mirtazapine  15 mg Oral QHS   multivitamin with minerals  1 tablet Oral Daily   nutrition supplement (JUVEN)  1 packet Oral BID BM   pantoprazole  40 mg Oral QHS   psyllium  1 packet Oral BID  sodium chloride flush  10-40 mL Intracatheter Q12H   [START ON 12/18/2021] thiamine  100 mg Oral BID   Continuous Infusions:  sodium chloride 10 mL/hr at 11/22/21 0434   thiamine injection 250 mg (50/93/26 7124)   TPN CYCLIC-ADULT (ION) 38 mL/hr at 12/13/21 0606   PRN Meds:.sodium chloride, lip balm, ondansetron **OR** ondansetron (ZOFRAN) IV, oxyCODONE, sodium chloride flush  Assessment/Plan: Patient Active Problem List   Diagnosis Date Noted   Delirium    Protein-calorie malnutrition, severe 11/26/2021   GERD (gastroesophageal reflux disease)    Hyperlipidemia    Hyponatremia    Acute blood loss anemia    Acute encephalopathy    Perforated abdominal viscus 11/17/2021   Perforated viscus 11/17/2021  s/p extended right colectomy with ileostomy for 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 Harlow Asa 11/27 - path: necrotic colon at site of perforation, no malignancy or dysplasia noted  - CT 12/4 w/ trace peripancreatic and right paracolic gutter simple free fluid that extends inferiorly into the right pelvis with associated peritoneal wall enhancement/thickening. No organized drainable fluid collection.  - Restarted abx 12/5. WBC normalized. Abx stopped. - Minimal oral intake. Was on cortrak + TF 12/7-8. Cortrak became clogged and patient refused replacement 12/8. - Ileostomy output improved. Off Imodium. Cont BID metamucil - WOC following for ileostomy, mucous fistula; d/c wound vac 12/14. wet to dry due to concern for wound breakdown.  Monitor. continue dressing change TID - Mobilize. PT/OT recommend SNF, TOC consulted - Pathology benign. CT distal descending/proximal sigmoid bowel luminal collapse -- radiology recommending colonoscopy as outpatient.    FEN - Regular diet as tolerated, ensure TID, TPN. Cycle TPN at night to try to stimulate appetite and continue Mirtazapine   VTE - SCDs, Lovenox ID - zosyn 11/27 - 12/2. 12/5-12/11 Foley - purewick, would prefer patient be getting up at least to [facility]   Encephalopathy/FTT - Patient has been on folic acid and thiamine supplements. Noted to have poor motor control and coordination the other day by PT which is different from last week per therapy notes, Neurology consulted -appreciated their input. No further workup needed at this time  Disposition: Poor oral intake.  Unable to d/c TPN at this time   LOS: 26 days    Rosario Adie, MD  [facility]

## 2021-12-13 NOTE — Progress Notes (Signed)
Physical Therapy Treatment Patient Details Name: Sherry Hampton MRN: [id number] DOB: [birth year] Today's Date: 12/13/2021   History of Present Illness 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 Harlow Asa 11/27  - CT 12/4 w/ trace peripancreatic and right paracolic gutter simple free fluid that extends inferiorly into the right pelvis with associated peritoneal wall enhancement/thickening. No organized drainable fluid collection.   - Restarted abx 12/5. WBC normalized. Abx stopped.  - Minimal oral intake. Was on cortrak + TF 12/7-8. Cortrak became clogged and patient refused replacement 12/8.  - Ileostomy output improved. Off Imodium. Cont BID metamucil  - WOC following for ileostomy, mucous fistula; d/c wound vac 12/14. and start wet to dry given air bubbles noted in wound bed. Monitor.  - purulent drainage in wound noted, increase dressing change to TID  - Mobilize. PT/OT recommend SNF, TOC consulted  - Pathology benign.    PT Comments    Patient making slow progress with mobility. Pt required +2 Mod assist for sit<>stand with RW and was hesitant to attempt lateral steps to move to recliner, and required +2 mod-max to pivot. Pt completed self care tasks with OT and then PT treatment continued. Repeated sit<>stands (5 reps) completed with repeat cues for technique/hand placement as pt had poor recall. Pt took 2 small lateral steps Rt/Lt but minimal position change occurred. She will continue to benefit from intense rehab to progress independence and mobility to regain PLOF. Acute PT will continue to progress as able.    Recommendations for follow up therapy are one component of a multi-disciplinary discharge planning process, led by the attending physician.  Recommendations may be updated based on patient status, additional functional criteria and insurance authorization.  PT Recommendation   Recommendations for Other  Services OT consult Filed 12/13/2021 1100  Follow Up Recommendations Acute inpatient rehab (3hours/day) Filed 12/13/2021 1100  Assistance recommended at discharge Frequent or constant Supervision/Assistance Filed 12/13/2021 1100  Functional Status Assessment Patient has had a recent decline in their functional status and demonstrates the ability to make significant improvements in function in a reasonable and predictable amount of time. Filed 11/18/2021 1100  PT equipment None recommended by PT Filed 12/13/2021 1100     12/13/21 1100  PT Visit Information  Last PT Received On 12/13/21  Assistance Needed +2  History of Present Illness 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 Harlow Asa 11/27  - CT 12/4 w/ trace peripancreatic and right paracolic gutter simple free fluid that extends inferiorly into the right pelvis with associated peritoneal wall enhancement/thickening. No organized drainable fluid collection.   - Restarted abx 12/5. WBC normalized. Abx stopped.  - Minimal oral intake. Was on cortrak + TF 12/7-8. Cortrak became clogged and patient refused replacement 12/8.  - Ileostomy output improved. Off Imodium. Cont BID metamucil  - WOC following for ileostomy, mucous fistula; d/c wound vac 12/14. and start wet to dry given air bubbles noted in wound bed. Monitor.  - purulent drainage in wound noted, increase dressing change to TID  - Mobilize. PT/OT recommend SNF, TOC consulted  - Pathology benign.  Precautions  Precautions Fall  Precaution Comments EXPLORATORY LAPAROTOMY, EXTENDED RIGHT COLECTOMY, END ILEOSTOMY, MUCOUS FISTULA (Abdomen)  PARTIAL COLECTOMY  Restrictions  Weight Bearing Restrictions No  Pain Assessment  Pain Assessment 0-10  Pain Descriptors / Indicators Discomfort  Pain Intervention(s) Limited activity within patient's tolerance;Monitored during  session;Repositioned  Cognition   Arousal/Alertness Awake/alert  Behavior During Therapy WFL for tasks assessed/performed  Overall Cognitive Status Impaired/Different from baseline  Current Attention Level Focused  Memory Decreased recall of precautions;Decreased short-term memory  Following Commands Follows multi-step commands consistently;Follows multi-step commands inconsistently;Follows one step commands with increased time  Safety/Judgement Decreased awareness of safety;Decreased awareness of deficits  Bed Mobility  General bed mobility comments pt at EOB with OT at bedside  Transfers  Overall transfer level Needs assistance  Equipment used None  Transfers Bed to chair/wheelchair/BSC;Sit to/from Stand  Sit to Stand +2 safety/equipment;Mod assist  Bed to/from chair/wheelchair/BSC transfer type: Stand pivot  Stand pivot transfers +2 physical assistance;+2 safety/equipment;Mod assist  General transfer comment 2+ assist for safety with transers. pt with good effort for power up and initaited wiht bil UE's from EOB. +2 to steady and complete rise to RW. Pt with Rt lean in standing and manual facilitation for midline posture required. Pt hesitant to attempt steps to move to recliner and Mod-Max+2 assist to pivot with RW. Pt completed repeat sit<>stands with poor carryover for hand placement on armrests to rise and cues needed each repetition. pt attempted small side steps in front of recliner but no position change.  Balance  Overall balance assessment Needs assistance  Sitting-balance support Feet supported;Bilateral upper extremity supported  Sitting balance-Leahy Scale Poor  Sitting balance - Comments significant posterior lean, as pt fatigues more flexed posture with Rt lean  Standing balance support Reliant on assistive device for balance;During functional activity;Bilateral upper extremity supported  Standing balance-Leahy Scale Poor  PT - End of Session  Equipment Utilized During Treatment Gait belt  Activity  Tolerance Patient tolerated treatment well  Patient left in chair;with call bell/phone within reach;with chair alarm set  Nurse Communication Mobility status   PT - Assessment/Plan  PT Plan Current plan remains appropriate  PT Visit Diagnosis Muscle weakness (generalized) (M62.81);Difficulty in walking, not elsewhere classified (R26.2)  PT Frequency (ACUTE ONLY) Min 2X/week  Recommendations for Other Services OT consult  Follow Up Recommendations Acute inpatient rehab (3hours/day)  Assistance recommended at discharge Frequent or constant Supervision/Assistance  PT equipment None recommended by PT  AM-PAC PT "6 Clicks" Mobility Outcome Measure (Version 2)  Help needed turning from your back to your side while in a flat bed without using bedrails? 2  Help needed moving from lying on your back to sitting on the side of a flat bed without using bedrails? 2  Help needed moving to and from a bed to a chair (including a wheelchair)? 2  Help needed standing up from a chair using your arms (e.g., wheelchair or bedside chair)? 1  Help needed to walk in hospital room? 1  Help needed climbing 3-5 steps with a railing?  1  6 Click Score 9  Consider Recommendation of Discharge To: CIR/SNF/LTACH  Progressive Mobility  What is the highest level of mobility based on the progressive mobility assessment? Level 2 (Chairfast) - Balance while sitting on edge of bed and cannot stand  Mobility Out of bed to chair with meals;Out of bed for toileting  PT Goal Progression  Progress towards PT goals Progressing toward goals (slow)  Acute Rehab PT Goals  PT Goal Formulation With patient  Time For Goal Achievement 12/16/21  Potential to Achieve Goals Good  PT Time Calculation  PT Start Time (ACUTE ONLY) 1142  PT Stop Time (ACUTE ONLY) 1212  PT Time Calculation (min) (ACUTE ONLY) 30 min  PT General Charges  $$  ACUTE PT VISIT 1 Visit  PT Treatments  $Therapeutic Activity 8-22 mins     Verner Mould,  DPT Acute Rehabilitation Services Office [phone] Pager [phone]   Jacques Navy 12/13/2021, 5:30 PM

## 2021-12-13 NOTE — Progress Notes (Signed)
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 Harlow Asa 11/27  - CT 12/4 w/ trace peripancreatic and right paracolic gutter simple free fluid that extends inferiorly into the right pelvis with associated peritoneal wall enhancement/thickening. No organized drainable fluid collection.   - Restarted abx 12/5. WBC normalized. Abx stopped.  - Minimal oral intake. Was on cortrak + TF 12/7-8. Cortrak became clogged and patient refused replacement 12/8.  - Ileostomy output improved. Off Imodium. Cont BID metamucil  - WOC following for ileostomy, mucous fistula; d/c wound vac 12/14. and start wet to dry given air bubbles noted in wound bed. Monitor.  - purulent drainage in wound noted, increase dressing change to TID  - Mobilize. PT/OT recommend SNF, TOC consulted  - Pathology benign.   OT comments  Treatment focused on sitting balance and participation in self care tasks. Patient requiring near constant verbal cues due to continued altered mental status. Patient max assist to transfer to side of bed, min-mod assist for sitting balance. Patient wants to lean back and prop with arms and at times just lean/fall backwards.  She required mod x 2 to stand but with significant difficulty getting her to stand upright and take steps. Patient performed grooming tasks in recliner using both hands but RUE does appear weaker. Needs verbal cues for sequencing and consistent encouragement as she has no insight into her deficits and only oriented to self. Continue to recommend short term rehab at discharge.    Recommendations for follow up therapy are one component of a multi-disciplinary discharge planning process, led by the attending physician.  Recommendations may be updated based on patient  status, additional functional criteria and insurance authorization.    Follow Up Recommendations  Skilled nursing-short term rehab (<3 hours/day)    Assistance Recommended at Discharge Frequent or constant Supervision/Assistance  Equipment Recommendations  None recommended by OT    Recommendations for Other Services      Precautions / Restrictions Precautions Precautions: Fall Precaution Comments: EXPLORATORY LAPAROTOMY, EXTENDED RIGHT COLECTOMY, END ILEOSTOMY, MUCOUS FISTULA (Abdomen)  PARTIAL COLECTOMY Restrictions Weight Bearing Restrictions: No       Mobility Bed Mobility Overal bed mobility: Needs Assistance Bed Mobility: Supine to Sit;Rolling Rolling: Mod assist Sidelying to sit: Max assist;HOB elevated       General bed mobility comments: MOd assist to roll towards the right. Max assist to transfer patient into sitting at edge of bed. Required min-max assist to maintain balance at edge of bed. patient wanting to prop with arms and lean back on bed. Constant verbal instruction and tactile cues to get her to lean anteriorly in preparation for standing and to improve trunk activation and strength.    Transfers Overall transfer level: Needs assistance Equipment used: Rolling walker (2 wheels) Transfers: Sit to/from Stand;Bed to chair/wheelchair/BSC Sit to Stand: +2 safety/equipment;Mod assist Stand pivot transfers: Mod assist;+2 physical assistance         General transfer comment: Requires physical assist for powering up, needs assist to weight shift and advance feet. Patient has poor standing posture over walker and requiring verbal and tactiel cues to get trunk up. Required more max assist to pivot into recliner due patient's reluctance.     Balance Overall balance assessment: Needs assistance Sitting-balance support: Feet supported;Bilateral upper extremity supported Sitting balance-Leahy Scale: Poor Sitting balance -  Comments: reliant on external assist                                    ADL either performed or assessed with clinical judgement   ADL Overall ADL's : Needs assistance/impaired Eating/Feeding: Set up;Sitting Eating/Feeding Details (indicate cue type and reason): to drink from cup with straw Grooming: Oral care;Wash/dry face;Sitting;Set up;Cueing for sequencing Grooming Details (indicate cue type and reason): patient placed in recliner for ADLs. Patient provided with tooth brush - she uses both hands at times to perform task. RUE appears weaker but is her dominant hand. Verbal cues required for spitting, to use basin, to continue task and to improve quality                                    Extremity/Trunk Assessment              Vision Baseline Vision/History: 1 Wears glasses Vision Assessment?: No apparent visual deficits   Perception     Praxis      Cognition Arousal/Alertness: Awake/alert Behavior During Therapy: WFL for tasks assessed/performed Overall Cognitive Status: Impaired/Different from baseline Area of Impairment: Orientation;Attention;Memory;Following commands;Safety/judgement;Awareness;Problem solving                 Orientation Level: Situation;Time;Disoriented to Current Attention Level: Focused Memory: Decreased recall of precautions;Decreased short-term memory Following Commands: Follows multi-step commands consistently;Follows multi-step commands inconsistently;Follows one step commands with increased time Safety/Judgement: Decreased awareness of safety;Decreased awareness of deficits Awareness: Intellectual Problem Solving: Slow processing;Decreased initiation;Difficulty sequencing;Requires verbal cues;Requires tactile cues General Comments: More alert and conversational but has no insight into her deficits or her disease process          Exercises     Shoulder Instructions       General Comments      Pertinent Vitals/ Pain       Pain Assessment:  Faces Faces Pain Scale: Hurts a little bit Pain Location: abdomen Pain Descriptors / Indicators: Discomfort Pain Intervention(s): Limited activity within patient's tolerance  Home Living                                          Prior Functioning/Environment              Frequency  Min 2X/week        Progress Toward Goals  OT Goals(current goals can now be found in the care plan section)  Progress towards OT goals: Progressing toward goals  Acute Rehab OT Goals OT Goal Formulation: Patient unable to participate in goal setting Time For Goal Achievement: 12/17/21 Potential to Achieve Goals: Good  Plan Discharge plan remains appropriate    Co-evaluation    PT/OT/SLP Co-Evaluation/Treatment: Yes Reason for Co-Treatment: For patient/therapist safety;To address functional/ADL transfers;Necessary to address cognition/behavior during functional activity   OT goals addressed during session: ADL's and self-care (sitting balance)      AM-PAC OT "6 Clicks" Daily Activity     Outcome Measure   Help from another person eating meals?: A Little Help from another person taking care of personal grooming?: A Little Help from another person toileting, which includes using toliet, bedpan, or urinal?: Total Help from another person bathing (including washing, rinsing, drying)?: A Lot Help from  another person to put on and taking off regular upper body clothing?: A Lot Help from another person to put on and taking off regular lower body clothing?: A Lot 6 Click Score: 13    End of Session Equipment Utilized During Treatment: Gait belt;Rolling walker (2 wheels)  OT Visit Diagnosis: Unsteadiness on feet (R26.81);Other symptoms and signs involving cognitive function;Pain;History of falling (Z91.81);Repeated falls (R29.6);Muscle weakness (generalized) (M62.81)   Activity Tolerance Patient tolerated treatment well   Patient Left in chair;with call bell/phone within  reach (left in care of PT)   Nurse Communication Mobility status        Time: 2446-2863 OT Time Calculation (min): 26 min  Charges: OT General Charges $OT Visit: 1 Visit OT Treatments $Self Care/Home Management : 8-22 mins  Derl Barrow, OTR/L [place]  Office [phone] Pager: [place] 12/13/2021, 12:37 PM

## 2021-12-13 NOTE — Progress Notes (Signed)
Calorie Count Note  72 hour calorie count ordered.  Diet: Regular Supplements: Ensure Enlive po TID, each supplement provides 350 kcal and 20 grams of protein -Juven, each serving provides 95kcal and 2.5g of protein  -Milk served with every meal tray   Day 2: Items consumed: 1 milk, 1/2 Juven  Total intake: 168 kcal (8% of minimum estimated needs)  9g protein (9% of minimum estimated needs)  Nutrition Dx: Severe Malnutrition related to acute illness as evidenced by energy intake < or equal to 50% for > or equal to 5 days, moderate fat depletion, severe muscle depletion.  Goal: Pt to meet >/= 90% of their estimated nutrition needs   Intervention:  -Continue Ensure TID, Juven BID -Milk with every meal -Encouraged PO intakes (agreeable to having some ice cream today -alerted NT) -Continue thiamine supplementation  -Continue cyclic TPN  Clayton Bibles, MS, RD, LDN Inpatient Clinical Dietitian Contact information available via Amion

## 2021-12-13 NOTE — Progress Notes (Signed)
PHARMACY - TOTAL PARENTERAL NUTRITION CONSULT NOTE   Indication:  Inability to meet enteral needs  Patient Measurements: Height: 5' 10" (177.8 cm) Weight: 74.5 kg (164 lb 3.9 oz) IBW/kg (Calculated) : 68.5 TPN AdjBW (KG): 79.8 Body mass index is 23.57 kg/m. Usual Weight: 80 kg  Assessment: 65 y.o. 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 She is now extended right colectomy and ileostomy. She developed acute metabolic encephalopathy post-op. She was initiated on TF, but tube became clogged on 12/8 and patient refused replacement, and is not eating enough to maintain nutrition.  Calorie count has been ordered.  CCS ordered TPN to start on 12/12, pharmacy to manage.  Glucose / Insulin: No hx DM. CBGs d/c on 12/16, resumed 31/49 with cyclic TPN. - CBGs <130.  D/c CBG checks since glucose remains stable on cycled TPN. Electrolytes: Lytes WNL except CorrCa (11.8) remains elevated despite removing Ca from TPN on 12/15. Renal: SCr < 1 stable, BUN 26 Hepatic: LFTs, Alk Phos, T.bili all WNL. Albumin low Trig: WNL Intake / Output: net I/O +512 mL -UOP 1400 mL, stool 1075 mL MIVF: none GI Imaging: GI Surgeries / Procedures:  11/27: Expiratory laparotomy, extended right colectomy, end ileostomy  Central access: PICC placed 12/11 TPN start date: 12/12  Nutritional Goals: Goal TPN rate is 80 mL/hr (provides 105 g of protein and 1977 kcals per day) 70/26 half-rate cyclic TPN: 3785 mL provides 54g protein and 1094 kcal per day  RD Assessment: Estimated Needs Total Energy Estimated Needs: 1950-2150 Total Protein Estimated Needs: 95-105g Total Fluid Estimated Needs: 2L/day  Current Nutrition:  Regular diet- eating 0% of meals, patient refusing to eat Nutritional supplements:   Boost or Breeze TID, Ensure TID, Juven BID TPN - half rate cycled TPN in attempt to stimulate appetite during the day.   CCS 12/21:  start calorie counts. Hopefully if oral intake is that good we can wean tpn over the next day or two but not sure how reliable her history is.   Plan:  At 88:50:  Continue cyclic TPN over 14 hours (1000 mL) with 1 hour taper up/down Electrolytes in TPN. No change Na 88mq/L K 553m/L Ca 41m17mL Mg 5mE42m Phos 15mm89m Cl:Ac 1:2 Continue multivitamin PO Thiamine per neurology D/C CBG checks IVF per MD - none currently Monitor TPN labs on Mon/Thurs Follow up calorie counts and ability to meet nutritional needs with enteral nutrition.     [name] 12/13/2021 9:40 AM

## 2021-12-14 LAB — GLUCOSE, CAPILLARY: Glucose-Capillary: 134 mg/dL — ABNORMAL HIGH (ref 70–99)

## 2021-12-14 MED ORDER — TRAVASOL 10 % IV SOLN
INTRAVENOUS | Status: AC
  Filled 2021-12-14: qty 540

## 2021-12-14 NOTE — Progress Notes (Addendum)
PHARMACY - TOTAL PARENTERAL NUTRITION CONSULT NOTE   Indication:  Inability to meet enteral needs  Patient Measurements: Height: _0  (177.8 cm) Weight: 74.5 kg (164 lb 3.9 oz) IBW/kg (Calculated) : 68.5 TPN AdjBW (KG): 79.8 Body mass index is 23.57 kg/m. Usual Weight: 80 kg  Assessment: 65 y.o. 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 She is now extended right colectomy and ileostomy. She developed acute metabolic encephalopathy post-op. She was initiated on TF, but tube became clogged on 12/8 and patient refused replacement, and is not eating enough to maintain nutrition.  Calorie count has been ordered.  CCS ordered TPN to start on 12/12, pharmacy to manage.  Glucose / Insulin: No hx DM. CBGs d/c on 12/16, resumed 30/94 with cyclic TPN. - CBGs <130.  D/c CBG checks since glucose remains stable on cycled TPN. Electrolytes: Lytes WNL except CorrCa (11.8) remains elevated despite removing Ca from TPN on 12/15.  Last labs on 12/22. Renal: SCr < 1 stable, BUN 26 Hepatic: LFTs, Alk Phos, T.bili all WNL. Albumin low Trig: WNL Intake / Output: net I/O -75 mL. UOP down to 750 mL, stool down to 200 mL (urine occurrence x6) MIVF: none GI Imaging: GI Surgeries / Procedures:  11/27: Expiratory laparotomy, extended right colectomy, end ileostomy  Central access: PICC placed 12/11 TPN start date: 12/12  Nutritional Goals: Goal TPN rate is 80 mL/hr (provides 105 g of protein and 1977 kcals per day) 07/68 half-rate cyclic TPN: 0881 mL provides 54g protein and 1094 kcal per day  RD Assessment: Estimated Needs Total Energy Estimated Needs: 1950-2150 Total Protein Estimated Needs: 95-105g Total Fluid Estimated Needs: 2L/day  Current Nutrition:  Regular diet- Per MD, pt only  taking in 10% of calories orally, discuss PEG placement today, patient refusing to eat Nutritional supplements:  Boost or Breeze TID, Ensure TID, Juven BID TPN - half rate cycled TPN in attempt to stimulate appetite during the day.    Plan:  At 10:31:  Continue cyclic TPN over 14 hours (1000 mL) with 1 hour taper up/down Electrolytes in TPN. No change Na 82mq/L K 574m/L Ca 26m29mL Mg 5mE46m Phos 15mm88m Cl:Ac 1:2 Continue multivitamin PO Thiamine per neurology D/C CBG checks IVF per MD - none currently Monitor TPN labs on Mon/Thurs Follow up calorie counts and/or PEG tube discussions to meet nutritional needs with enteral nutrition.   [name], BCPS Clinical Pharmacist [facility] main pharmacy [phone]/2022 9:54 AM

## 2021-12-14 NOTE — Progress Notes (Signed)
Patient ID: Sherry Hampton, female   DOB: 1956/10/21, 65 y.o.   MRN: [id number]   Acute Care Surgery Service Progress Note:    Chief Complaint/Subjective: No abd pain, eating some (10% of needed calories)   Objective: Vital signs in last 24 hours: Temp:  [98.6 F (37 C)-99.1 F (37.3 C)] 98.7 F (37.1 C) (12/24 0459) Pulse Rate:  [84-103] 84 (12/24 0459) Resp:  [14-18] 18 (12/24 0459) BP: (112-122)/(60-71) 112/60 (12/24 0459) SpO2:  [98 %-99 %] 99 % (12/24 0459) Last BM Date: 12/14/21  Intake/Output from previous day: 12/23 0701 - 12/24 0700 In: 875.3 [P.O.:600; I.V.:225.3; IV Piggyback:50] Out: 950 [Urine:750; Stool:200] Intake/Output this shift: No intake/output data recorded.  General: pleasant, WD, elderly female who is in bed and in NAD Abd: soft, NT, ND, mucus fistula viable with some thin drainage, ileostomy viable with stool and gas in ostomy appliance, midline wound with mostly healthy granulation tissue  MS: all 4 extremities are symmetrical with no cyanosis, clubbing, or edema.  Lab Results: CBC  No results for input(s): WBC, HGB, HCT, PLT in the last 72 hours.  BMET Recent Labs    12/12/21 0432  NA 139  K 3.7  CL 109  CO2 27  GLUCOSE 125*  BUN 26*  CREATININE 0.47  CALCIUM 10.5*    LFT Hepatic Function Latest Ref Rng & Units 12/12/2021 12/09/2021 12/07/2021  Total Protein 6.5 - 8.1 g/dL 7.8 7.6 8.1  Albumin 3.5 - 5.0 g/dL 2.4(L) 2.4(L) 2.5(L)  AST 15 - 41 U/L '16 19 20  ' ALT 0 - 44 U/L '14 16 13  ' Alk Phosphatase 38 - 126 U/L 82 84 86  Total Bilirubin 0.3 - 1.2 mg/dL 0.3 0.4 0.3  Bilirubin, Direct 0.0 - 0.2 mg/dL - - -   PT/INR No results for input(s): LABPROT, INR in the last 72 hours. ABG No results for input(s): PHART, HCO3 in the last 72 hours.  Invalid input(s): PCO2, PO2  Studies/Results:  Anti-infectives: Anti-infectives (From admission, onward)    Start     Dose/Rate Route Frequency Ordered Stop   11/25/21 1000   piperacillin-tazobactam (ZOSYN) IVPB 3.375 g  Status:  Discontinued        3.375 g 12.5 mL/hr over 240 Minutes Intravenous Every 8 hours 11/25/21 0909 12/01/21 0822   11/17/21 1830  piperacillin-tazobactam (ZOSYN) IVPB 3.375 g        3.375 g 12.5 mL/hr over 240 Minutes Intravenous Every 8 hours 11/17/21 1415 11/22/21 2359   11/17/21 1230  piperacillin-tazobactam (ZOSYN) IVPB 3.375 g        3.375 g 100 mL/hr over 30 Minutes Intravenous  Once 11/17/21 1220 11/17/21 1304       Medications: Scheduled Meds:  acetaminophen  1,000 mg Oral Q6H   chlorhexidine  15 mL Mouth Rinse BID   Chlorhexidine Gluconate Cloth  6 each Topical Daily   enoxaparin (LOVENOX) injection  40 mg Subcutaneous Q24H   feeding supplement  1 Container Oral TID BM   feeding supplement  237 mL Oral TID BM   folic acid  1 mg Oral Daily   mouth rinse  15 mL Mouth Rinse q12n4p   mirtazapine  15 mg Oral QHS   multivitamin with minerals  1 tablet Oral Daily   nutrition supplement (JUVEN)  1 packet Oral BID BM   pantoprazole  40 mg Oral QHS   psyllium  1 packet Oral BID   sodium chloride flush  10-40 mL Intracatheter Q12H   [START ON 12/18/2021]  thiamine  100 mg Oral BID   Continuous Infusions:  sodium chloride 10 mL/hr at 11/22/21 0434   thiamine injection 250 mg (69/62/95 2841)   TPN CYCLIC-ADULT (ION) 38 mL/hr at 12/14/21 0724   PRN Meds:.sodium chloride, lip balm, ondansetron **OR** ondansetron (ZOFRAN) IV, oxyCODONE, sodium chloride flush  Assessment/Plan: Patient Active Problem List   Diagnosis Date Noted   Delirium    Protein-calorie malnutrition, severe 11/26/2021   GERD (gastroesophageal reflux disease)    Hyperlipidemia    Hyponatremia    Acute blood loss anemia    Acute encephalopathy    Perforated abdominal viscus 11/17/2021   Perforated viscus 11/17/2021  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 Harlow Asa 11/27 -  path: necrotic colon at site of perforation, no malignancy or dysplasia noted  - CT 12/4 w/ trace peripancreatic and right paracolic gutter simple free fluid that extends inferiorly into the right pelvis with associated peritoneal wall enhancement/thickening. No organized drainable fluid collection.  - Restarted abx 12/5. WBC normalized. Abx stopped. - Minimal oral intake. Was on cortrak + TF 12/7-8. Cortrak became clogged and patient refused replacement 12/8. - Ileostomy output improved. Off Imodium. Cont BID metamucil - WOC following for ileostomy, mucous fistula; d/c wound vac 12/14. wet to dry due to concern for wound breakdown.  Monitor. continue dressing change TID - Mobilize. PT/OT recommend SNF, TOC consulted - Pathology benign. CT distal descending/proximal sigmoid bowel luminal collapse -- radiology recommending colonoscopy as outpatient.    FEN - Regular diet as tolerated, ensure TID, TPN. Cycle TPN at night to try to stimulate appetite and continue Mirtazapine, pt only taking in 10% of calories orally, discuss PEG placement today with her and yesterday with her NOK   VTE - SCDs, Lovenox ID - zosyn 11/27 - 12/2. 12/5-12/11 Foley - purewick, would prefer patient be getting up at least to [facility]   Encephalopathy/FTT - Patient has been on folic acid and thiamine supplements. Noted to have poor motor control and coordination the other day by PT which is different from last week per therapy notes, Neurology consulted -appreciated their input. No further workup needed at this time  Disposition: Poor oral intake.  Unable to d/c TPN at this time   LOS: 27 days    Rosario Adie, MD  [facility]

## 2021-12-15 MED ORDER — TRAVASOL 10 % IV SOLN
INTRAVENOUS | Status: AC
  Filled 2021-12-15: qty 540

## 2021-12-15 NOTE — Progress Notes (Signed)
28 Days Post-Op   Subjective/Chief Complaint: Patient awake and alert.  Pleasant.  No complaints.   Objective: Vital signs in last 24 hours: Temp:  [97.5 F (36.4 C)-98 F (36.7 C)] 97.9 F (36.6 C) (12/25 0556) Pulse Rate:  [83-91] 87 (12/25 0556) Resp:  [18] 18 (12/25 0556) BP: (101-109)/(80-83) 104/80 (12/25 0556) SpO2:  [98 %-100 %] 98 % (12/25 0556) Last BM Date: 12/14/21  Intake/Output from previous day: 12/24 0701 - 12/25 0700 In: 1186 [P.O.:300; I.V.:836; IV Piggyback:50] Out: 3500 [Urine:2150; ZOXWR:6045] Intake/Output this shift: No intake/output data recorded.   General: pleasant, WD, elderly female who is in bed and in NAD Abd: soft, NT, ND, mucus fistula viable with some thin drainage, ileostomy viable with stool and gas in ostomy appliance, midline wound with mostly healthy granulation tissue  MS: all 4 extremities are symmetrical with no cyanosis, clubbing, or edema.   Lab Results:  No results for input(s): WBC, HGB, HCT, PLT in the last 72 hours. BMET No results for input(s): NA, K, CL, CO2, GLUCOSE, BUN, CREATININE, CALCIUM in the last 72 hours. PT/INR No results for input(s): LABPROT, INR in the last 72 hours. ABG No results for input(s): PHART, HCO3 in the last 72 hours.  Invalid input(s): PCO2, PO2  Studies/Results: No results found.  Anti-infectives: Anti-infectives (From admission, onward)    Start     Dose/Rate Route Frequency Ordered Stop   11/25/21 1000  piperacillin-tazobactam (ZOSYN) IVPB 3.375 g  Status:  Discontinued        3.375 g 12.5 mL/hr over 240 Minutes Intravenous Every 8 hours 11/25/21 0909 12/01/21 0822   11/17/21 1830  piperacillin-tazobactam (ZOSYN) IVPB 3.375 g        3.375 g 12.5 mL/hr over 240 Minutes Intravenous Every 8 hours 11/17/21 1415 11/22/21 2359   11/17/21 1230  piperacillin-tazobactam (ZOSYN) IVPB 3.375 g        3.375 g 100 mL/hr over 30 Minutes Intravenous  Once 11/17/21 1220 11/17/21 1304        Assessment/Plan: s/p Procedure(s): EXPLORATORY LAPAROTOMY, EXTENDED RIGHT COLECTOMY, END ILEOSTOMY, MUCOUS FISTULA (N/A) PARTIAL COLECTOMY (N/A)  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 Harlow Asa 11/27 - path: necrotic colon at site of perforation, no malignancy or dysplasia noted  - CT 12/4 w/ trace peripancreatic and right paracolic gutter simple free fluid that extends inferiorly into the right pelvis with associated peritoneal wall enhancement/thickening. No organized drainable fluid collection.  - Restarted abx 12/5. WBC normalized. Abx stopped. - Minimal oral intake. Was on cortrak + TF 12/7-8. Cortrak became clogged and patient refused replacement 12/8. - Ileostomy output improved. Off Imodium. Cont BID metamucil - WOC following for ileostomy, mucous fistula; d/c wound vac 12/14. wet to dry due to concern for wound breakdown.  Monitor. continue dressing change TID - Mobilize. PT/OT recommend SNF, TOC consulted - Pathology benign. CT distal descending/proximal sigmoid bowel luminal collapse -- radiology recommending colonoscopy as outpatient.    FEN - Regular diet as tolerated, ensure TID, TPN. Cycle TPN at night to try to stimulate appetite and continue Mirtazapine, pt only taking in 10% of calories orally,  PEG placement  if family and pt amendable      VTE - SCDs, Lovenox ID - zosyn 11/27 - 12/2. 12/5-12/11 Foley - purewick, would prefer patient be getting up at least to [facility]   Encephalopathy/FTT - Patient has been on folic acid and thiamine supplements. Noted to have  poor motor control and coordination the other day by PT which is different from last week per therapy notes, Neurology consulted -appreciated their input. No further workup needed at this time   Disposition: Poor oral intake.  Unable to d/c TPN at this time      LOS: 28 days    Turner Daniels MD  12/15/2021

## 2021-12-15 NOTE — Progress Notes (Signed)
PHARMACY - TOTAL PARENTERAL NUTRITION CONSULT NOTE   Indication:  Inability to meet enteral needs  Patient Measurements: Height: _0  (177.8 cm) Weight: 74.5 kg (164 lb 3.9 oz) IBW/kg (Calculated) : 68.5 TPN AdjBW (KG): 79.8 Body mass index is 23.57 kg/m. Usual Weight: 80 kg  Assessment: 65 y.o. 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 She is now extended right colectomy and ileostomy. She developed acute metabolic encephalopathy post-op. She was initiated on TF, but tube became clogged on 12/8 and patient refused replacement, and is not eating enough to maintain nutrition.  Calorie count has been ordered.  CCS ordered TPN to start on 12/12, pharmacy to manage.  Glucose / Insulin: No hx DM. CBGs d/c on 12/16, resumed 44/69 with cyclic TPN. - CBGs <130.  D/c CBG checks since glucose remains stable on cycled TPN. Electrolytes: Last labs on 12/22. Lytes WNL except CorrCa (11.8) remains elevated despite removing Ca from TPN on 12/15.  Renal: SCr < 1 stable, BUN 26 Hepatic: LFTs, Alk Phos, T.bili all WNL. Albumin low Trig: WNL Intake / Output: net I/O -2.3 L. UOP back up to 2150 mL, stool output up to 1350 mL MIVF: none GI Imaging: GI Surgeries / Procedures:  11/27: Expiratory laparotomy, extended right colectomy, end ileostomy  Central access: PICC placed 12/11 TPN start date: 12/12  Nutritional Goals: Goal TPN rate is 80 mL/hr (provides 105 g of protein and 1977 kcals per day) 50/72 half-rate cyclic TPN: 2575 mL provides 54g protein and 1094 kcal per day  RD Assessment: Estimated Needs Total Energy Estimated Needs: 1950-2150 Total Protein Estimated Needs: 95-105g Total Fluid Estimated Needs: 2L/day  Current Nutrition:  Regular diet- Per MD, pt only taking in 10%  of calories orally, discuss PEG placement today, patient refusing to eat Nutritional supplements:  Boost or Breeze TID, Ensure TID, Juven BID TPN - half rate cycled TPN in attempt to stimulate appetite during the day.    Plan:  At 05:18:  Continue cyclic TPN over 14 hours (1000 mL) with 1 hour taper up/down Electrolytes in TPN. No change Na 54mq/L K 549m/L Ca 74m274mL Mg 5mE43m Phos 15mm274m Cl:Ac 1:2 Continue multivitamin PO Thiamine per neurology IVF per MD - none currently Monitor TPN labs on Mon/Thurs Follow up calorie counts and/or PEG tube discussions to meet nutritional needs with enteral nutrition.   [name], BCPS Clinical Pharmacist [facility] main pharmacy [phone]/2022 8:48 AM

## 2021-12-15 NOTE — Progress Notes (Signed)
Patient refused dressing change at this time, will attempt later.

## 2021-12-16 LAB — COMPREHENSIVE METABOLIC PANEL
ALT: 12 U/L (ref 0–44)
AST: 13 U/L — ABNORMAL LOW (ref 15–41)
Albumin: 2.3 g/dL — ABNORMAL LOW (ref 3.5–5.0)
Alkaline Phosphatase: 76 U/L (ref 38–126)
Anion gap: 5 (ref 5–15)
BUN: 21 mg/dL (ref 8–23)
CO2: 25 mmol/L (ref 22–32)
Calcium: 10 mg/dL (ref 8.9–10.3)
Chloride: 110 mmol/L (ref 98–111)
Creatinine, Ser: 0.37 mg/dL — ABNORMAL LOW (ref 0.44–1.00)
GFR, Estimated: >60 mL/min (ref >60)
Glucose, Bld: 121 mg/dL — ABNORMAL HIGH (ref 70–99)
Potassium: 3.5 mmol/L (ref 3.5–5.1)
Sodium: 140 mmol/L (ref 135–145)
Total Bilirubin: 0.4 mg/dL (ref 0.3–1.2)
Total Protein: 7.6 g/dL (ref 6.5–8.1)

## 2021-12-16 LAB — TRIGLYCERIDES: Triglycerides: 54 mg/dL (ref <150)

## 2021-12-16 LAB — MAGNESIUM: Magnesium: 1.9 mg/dL (ref 1.7–2.4)

## 2021-12-16 LAB — PHOSPHORUS: Phosphorus: 2.5 mg/dL (ref 2.5–4.6)

## 2021-12-16 MED ORDER — TRAVASOL 10 % IV SOLN
INTRAVENOUS | Status: DC
  Filled 2021-12-16: qty 540

## 2021-12-16 MED ORDER — TRAVASOL 10 % IV SOLN
INTRAVENOUS | Status: AC
  Filled 2021-12-16: qty 540

## 2021-12-16 NOTE — Progress Notes (Signed)
29 Days Post-Op   Subjective/Chief Complaint: No new complaints   Objective: Vital signs in last 24 hours: Temp:  [97.9 F (36.6 C)-98.2 F (36.8 C)] 97.9 F (36.6 C) (12/26 0548) Pulse Rate:  [89-107] 89 (12/26 0548) Resp:  [14-18] 16 (12/26 0548) BP: (96-124)/(68-81) 96/81 (12/26 0548) SpO2:  [98 %-100 %] 98 % (12/26 0548) Weight:  [75.5 kg] 75.5 kg (12/26 0500) Last BM Date: 12/15/21  Intake/Output from previous day: 12/25 0701 - 12/26 0700 In: 1082.5 [P.O.:180; I.V.:852.5; IV Piggyback:50] Out: 2820 [Urine:2300; Stool:520] Intake/Output this shift: No intake/output data recorded.   General: pleasant, WD, elderly female who is in bed and in NAD Abd: soft, NT, ND, mucus fistula viable with some thin drainage, ileostomy viable with stool and gas in ostomy appliance, midline wound with mostly healthy granulation tissue  MS: all 4 extremities are symmetrical with no cyanosis, clubbing, or edema.  Lab Results:  No results for input(s): WBC, HGB, HCT, PLT in the last 72 hours. BMET Recent Labs    12/16/21 0304  NA 140  K 3.5  CL 110  CO2 25  GLUCOSE 121*  BUN 21  CREATININE 0.37*  CALCIUM 10.0   PT/INR No results for input(s): LABPROT, INR in the last 72 hours. ABG No results for input(s): PHART, HCO3 in the last 72 hours.  Invalid input(s): PCO2, PO2  Studies/Results: No results found.  Anti-infectives: Anti-infectives (From admission, onward)    Start     Dose/Rate Route Frequency Ordered Stop   11/25/21 1000  piperacillin-tazobactam (ZOSYN) IVPB 3.375 g  Status:  Discontinued        3.375 g 12.5 mL/hr over 240 Minutes Intravenous Every 8 hours 11/25/21 0909 12/01/21 0822   11/17/21 1830  piperacillin-tazobactam (ZOSYN) IVPB 3.375 g        3.375 g 12.5 mL/hr over 240 Minutes Intravenous Every 8 hours 11/17/21 1415 11/22/21 2359   11/17/21 1230  piperacillin-tazobactam (ZOSYN) IVPB 3.375 g        3.375 g 100 mL/hr over 30 Minutes Intravenous  Once  11/17/21 1220 11/17/21 1304       Assessment/Plan: 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 Harlow Asa 11/27 - path: necrotic colon at site of perforation, no malignancy or dysplasia noted  - CT 12/4 w/ trace peripancreatic and right paracolic gutter simple free fluid that extends inferiorly into the right pelvis with associated peritoneal wall enhancement/thickening. No organized drainable fluid collection.  - Restarted abx 12/5. WBC normalized. Abx stopped. - Minimal oral intake. Was on cortrak + TF 12/7-8. Cortrak became clogged and patient refused replacement 12/8. - Ileostomy output improved. Off Imodium. Cont BID metamucil - WOC following for ileostomy, mucous fistula; d/c wound vac 12/14. wet to dry due to concern for wound breakdown.  Monitor. continue dressing change TID - Mobilize. PT/OT recommend SNF, TOC consulted - Pathology benign. CT distal descending/proximal sigmoid bowel luminal collapse -- radiology recommending colonoscopy as outpatient.    FEN - Regular diet as tolerated, ensure TID, TPN. Cycle TPN at night to try to stimulate appetite and continue Mirtazapine, pt only taking in 10% of calories orally,  PEG placement  if family and pt agreeable    VTE - SCDs, Lovenox ID - zosyn 11/27 - 12/2. 12/5-12/11 Foley - purewick, would prefer patient be getting up at least to [facility]   Encephalopathy/FTT - Patient has been on folic acid and thiamine supplements. Noted to have poor  motor control and coordination the other day by PT which is different from last week per therapy notes, Neurology consulted -appreciated their input. No further workup needed at this time   Disposition: Poor oral intake.  Unable to d/c TPN at this time     LOS: 29 days    Sherry Hampton 12/16/2021

## 2021-12-16 NOTE — Consult Note (Signed)
[facility] Nurse ostomy follow up Patient receiving care in [place]. No family preset. Stoma type/location: RUQ ileostomy, 1 1/4 inches, round, moist, pink. Mucous fistula (MF) LUQ, with some sloughing. Stomal assessment/size: MF pouch not changed, and not measured Peristomal assessment: ileostomy peristomal skin intact Treatment options for stomal/peristomal skin: barrier ring Output: thin brown output from ileostomy. No output from MF.  Ostomy pouching: 2pc. 2 and 1/4 inch skin barrier and pouch. Education provided: none; patient cannot be taught ostomy self care. Enrolled patient in [facility] Discharge program: Yes, previously.  [facility] team will see patient weekly. Bedside nurse to perform pouch changes as needed.  Val Riles, RN, MSN, CWOCN, CNS-BC, pager [phone]

## 2021-12-16 NOTE — Progress Notes (Signed)
Nutrition Follow-up  DOCUMENTATION CODES:   Severe malnutrition in context of acute illness/injury  INTERVENTION:   -Cyclic TPN management per Pharmacy -Continue thiamine supplementation (dosage was increased by Neurology)   -Ensure Enlive po BID, each supplement provides 350 kcal and 20 grams of protein -Pt prefers this in strawberry   -Milk served with every meal tray (pt enjoys 2% -provides 120 kcals and 8g protein per carton)   -Juven, each serving provides 95kcal and 2.5g of protein (amino acids glutamine and arginine)   If PEG placed: -Initiate Osmolite 1.5 @ 20 ml/hr and advance by 10 ml every 8 hours to goal rate of 60 ml/hr. -provides 2160 kcals, 90g protein and 1097 ml H2O -Will need free water flushes of 200 ml every 6 hours (800 ml)  NUTRITION DIAGNOSIS:   Severe Malnutrition related to acute illness as evidenced by energy intake < or equal to 50% for > or equal to 5 days, moderate fat depletion, severe muscle depletion.  Ongoing.  GOAL:   Patient will meet greater than or equal to 90% of their needs  Progressing.  MONITOR:   PO intake, Supplement acceptance, Labs, Weight trends, I & O's (TPN)  ASSESSMENT:   65 year old female who presents to the emergency department with sudden onset of abdominal pain and distention.CT scan of abdomen and pelvis shows findings consistent with free air with a large pneumoperitoneum.  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  No documentation in envelope on door for last day of Calorie Count. Pt states she mainly drinks her 2% milk which she gets with her meals.   Day 3 12/23: Per flowsheets, consumed 1 milk, some sips of juice. Refused her ice cream.  ~120 kcals, 8g protein  12/24: pt refused all meals 12/25: pt had a few bites of mashed potatoes, sips of Ensure.  Cyclic TPN continues x 14 hours ,providing 1094 kcals and 54g protein.  Pt now in discussion about having PEG placed. She  reports her brother is leaning towards this. Pt very vague how she feels about it. States she did not like the NGT, RD told pt she wouldn't have any discomfort in her nose or throat from a PEG. Will leave TF recommendations if pt opts to have PEG placed.   Admission weight: 176 lbs Current weight: 166 lbs.  Medications: Folic acid, Multivitamin with minerals daily, Metamucil, Thiamine  Labs reviewed: CBGs: 93-171 TG: 54  Diet Order:   Diet Order             Diet regular Room service appropriate? Yes; Fluid consistency: Thin  Diet effective now                   EDUCATION NEEDS:   Education needs have been addressed  Skin:  Skin Assessment: Skin Integrity Issues: Skin Integrity Issues:: Incisions Incisions: 11/27 abdomen  Last BM:  12/26 -ileostomy  Height:   Ht Readings from Last 1 Encounters:  11/17/21 5\' 10"  (1.778 m)    Weight:   Wt Readings from Last 1 Encounters:  12/16/21 75.5 kg    BMI:  Body mass index is 23.88 kg/m.  Estimated Nutritional Needs:   Kcal:  1950-2150  Protein:  95-105g  Fluid:  2L/day  Clayton Bibles, MS, RD, LDN Inpatient Clinical Dietitian Contact information available via Amion

## 2021-12-16 NOTE — Progress Notes (Signed)
PHARMACY - TOTAL PARENTERAL NUTRITION CONSULT NOTE   Indication:  Inability to meet enteral needs  Patient Measurements: Height: '5\' 10"'  (177.8 cm) Weight: 75.5 kg (166 lb 7.2 oz) IBW/kg (Calculated) : 68.5 TPN AdjBW (KG): 79.8 Body mass index is 23.88 kg/m. Usual Weight: 80 kg  Assessment: 65 y.o. 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 She is now extended right colectomy and ileostomy. She developed acute metabolic encephalopathy post-op. She was initiated on TF, but tube became clogged on 12/8 and patient refused replacement, and is not eating enough to maintain nutrition.  Calorie count has been ordered.  CCS ordered TPN to start on 12/12, pharmacy to manage.  Glucose / Insulin: No hx DM. CBGs controlled and SSI d/c'd on 12/24. Electrolytes: Lytes WNL except CorrCa (11.2) remains elevated but trending down. Ca removed in TPN on 12/15. Renal: SCr < 1 stable, BUN 26 Hepatic: LFTs, Alk Phos, T.bili all WNL. Albumin low Trig: WNL Intake / Output: UOP good at 2.3 L yesterday, stool output down to 520 mL MIVF: none GI Imaging: GI Surgeries / Procedures:  11/27: Expiratory laparotomy, extended right colectomy, end ileostomy  Central access: PICC placed 12/11 TPN start date: 12/12  Nutritional Goals: Goal TPN rate is 80 mL/hr (provides 105 g of protein and 1977 kcals per day) 97/02 half-rate cyclic TPN: 6378 mL provides 54g protein and 1094 kcal per day  RD Assessment: Estimated Needs Total Energy Estimated Needs: 1950-2150 Total Protein Estimated Needs: 95-105g Total Fluid Estimated Needs: 2L/day  Current Nutrition:  Regular diet- Per MD, pt only taking in 10% of calories orally, discuss PEG placement today, patient refusing to eat Nutritional supplements:  Boost  or Breeze TID, Ensure TID, Juven BID but intake of supplements is not consistent TPN - half rate cycled TPN in attempt to stimulate appetite during the day.    Plan:  At 58:85:  Continue cyclic TPN. Infuse 1,000 mL over 14 hrs: 38 mL/hr x 1 hr, then 77 mL/hr x 12 hrs, then 38 mL/hr x 1 hr. Electrolytes in TPN. No change Na 9mq/L K 565m/L Ca 96m6mL Mg 5mE28m Phos 15mm496m Cl:Ac 1:2 Continue multivitamin PO Thiamine per neurology Monitor TPN labs on Mon/Thurs Follow up calorie counts and/or PEG tube discussions to meet nutritional needs with enteral nutrition. Mirtazapine discontinued on 12/24 per CCS as it did not seem to be helping and may contribute to encephalopathy  NathaElenor QuinonesrmD, BCPS, BCIDP Clinical Pharmacist 12/16/2021 7:20 AM

## 2021-12-16 NOTE — Progress Notes (Signed)
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 Harlow Asa 11/27  - CT 12/4 w/ trace peripancreatic and right paracolic gutter simple free fluid that extends inferiorly into the right pelvis with associated peritoneal wall enhancement/thickening. No organized drainable fluid collection.   - Restarted abx 12/5. WBC normalized. Abx stopped.  - Minimal oral intake. Was on cortrak + TF 12/7-8. Cortrak became clogged and patient refused replacement 12/8.  - Ileostomy output improved. Off Imodium. Cont BID metamucil  - WOC following for ileostomy, mucous fistula; d/c wound vac 12/14. and start wet to dry given air bubbles noted in wound bed. Monitor.  - purulent drainage in wound noted, increase dressing change to TID  - Mobilize. PT/OT recommend SNF, TOC consulted  - Pathology benign.    PT Comments    Patient remains limited this date and appeared to have slightly increased difficulty following cues/commands and sequencing. Mod assist required to move to EOB and pt unable to activate LE's with sit<>stands and +2 Max assist from EOB with RW or Stedy. Pt fatigued and required +2 assist to return to supine. EOS pt completed exercises in chair position. Limited quad activation noted in bil LE's for SAQ and mod-max assist for anterior trunk pulls with bil UE use. She will benefit from intense rehab as pt is significantly below baseline of independent PTA. Acute PT will continue to progress pt as able throughout stay.    Recommendations for follow up therapy are one component of a multi-disciplinary discharge planning process, led by the attending physician.  Recommendations may be updated based on patient status, additional functional criteria and insurance authorization.  Follow Up  Recommendations  Acute inpatient rehab (3hours/day)     Assistance Recommended at Discharge Frequent or constant Supervision/Assistance  Equipment Recommendations  None recommended by PT    Recommendations for Other Services OT consult     Precautions / Restrictions Precautions Precautions: Fall Precaution Comments: EXPLORATORY LAPAROTOMY, EXTENDED RIGHT COLECTOMY, END ILEOSTOMY, MUCOUS FISTULA (Abdomen)  PARTIAL COLECTOMY Restrictions Weight Bearing Restrictions: No     Mobility  Bed Mobility Overal bed mobility: Needs Assistance Bed Mobility: Rolling;Sidelying to Sit;Sit to Supine Rolling: Mod assist Sidelying to sit: Max assist;HOB elevated   Sit to supine: Max assist;+2 for safety/equipment   General bed mobility comments: Cues to initiate and sequence rolling to Lt side and to bring LE's off EOB. Mod assist to complete roll and Max to raise trunk due to poor sequencing of UE placement/use to press up. pt with reduced posterior lean this date in sitting and able to keep Lt hand on bed rail and Rt hand on knee to facilitate anterior trunk lean. intermitent Min-Mod assist required to prevent posterior lean. Max+2 to return to supine in bed and boost. bed placed in chair position. Attempted anterior trunk lean/pull with pt holding therapists hands and using bil UE's.    Transfers                   General transfer comment: attempted sit<>stand with RW and Stedy from elevated EOB with +2 assist. pt unable to achieve rise. pt fatiguing throughout attempts and returned to supine.    Ambulation/Gait                   Stairs  Wheelchair Mobility    Modified Rankin (Stroke Patients Only)       Balance Overall balance assessment: Needs assistance Sitting-balance support: Feet supported;Bilateral upper extremity supported Sitting balance-Leahy Scale: Poor Sitting balance - Comments: significant posterior lean, as pt fatigues more flexed  posture with Rt lean   Standing balance support: Reliant on assistive device for balance;During functional activity;Bilateral upper extremity supported Standing balance-Leahy Scale: Poor                              Cognition Arousal/Alertness: Awake/alert Behavior During Therapy: WFL for tasks assessed/performed Overall Cognitive Status: Impaired/Different from baseline Area of Impairment: Orientation;Attention;Memory;Following commands;Safety/judgement;Awareness;Problem solving                 Orientation Level: Situation;Time;Disoriented to Current Attention Level: Focused Memory: Decreased recall of precautions;Decreased short-term memory Following Commands: Follows multi-step commands inconsistently;Follows one step commands with increased time;Follows one step commands inconsistently;Follows multi-step commands with increased time Safety/Judgement: Decreased awareness of safety;Decreased awareness of deficits Awareness: Intellectual Problem Solving: Slow processing;Decreased initiation;Difficulty sequencing;Requires verbal cues;Requires tactile cues General Comments: pt more confused today, unable to focus/maintain attention on current questions/tasks. pt pleasant and willing to work, somewhat emotionally labile laughing at times but also appearing sad/depressed about lack of strength and current deficits.        Exercises      General Comments        Pertinent Vitals/Pain Pain Assessment: Faces Faces Pain Scale: Hurts little more Pain Location: abdomen Pain Descriptors / Indicators: Discomfort Pain Intervention(s): Limited activity within patient's tolerance;Monitored during session;Repositioned    Home Living                          Prior Function            PT Goals (current goals can now be found in the care plan section) Acute Rehab PT Goals PT Goal Formulation: With patient Time For Goal Achievement: 12/16/21 Potential to  Achieve Goals: Good Progress towards PT goals: Progressing toward goals    Frequency    Min 2X/week      PT Plan Current plan remains appropriate    Co-evaluation              AM-PAC PT "6 Clicks" Mobility   Outcome Measure  Help needed turning from your back to your side while in a flat bed without using bedrails?: A Lot Help needed moving from lying on your back to sitting on the side of a flat bed without using bedrails?: A Lot Help needed moving to and from a bed to a chair (including a wheelchair)?: A Lot Help needed standing up from a chair using your arms (e.g., wheelchair or bedside chair)?: Total Help needed to walk in hospital room?: Total Help needed climbing 3-5 steps with a railing? : Total 6 Click Score: 9    End of Session Equipment Utilized During Treatment: Gait belt Activity Tolerance: Patient tolerated treatment well Patient left: in chair;with call bell/phone within reach;with chair alarm set Nurse Communication: Mobility status PT Visit Diagnosis: Muscle weakness (generalized) (M62.81);Difficulty in walking, not elsewhere classified (R26.2)     Time: 0277-4128 PT Time Calculation (min) (ACUTE ONLY): 31 min  Charges:  $Therapeutic Activity: 23-37 mins                     Verner Mould, DPT Acute Rehabilitation Services Office [phone]  Pager [phone]    Sherry Hampton 12/16/2021, 11:12 AM

## 2021-12-17 ENCOUNTER — Encounter (HOSPITAL_COMMUNITY): Payer: Self-pay | Admitting: Radiology

## 2021-12-17 ENCOUNTER — Inpatient Hospital Stay (HOSPITAL_COMMUNITY): Payer: Medicare HMO

## 2021-12-17 DIAGNOSIS — R109 Unspecified abdominal pain: Secondary | ICD-10-CM | POA: Diagnosis not present

## 2021-12-17 DIAGNOSIS — R198 Other specified symptoms and signs involving the digestive system and abdomen: Secondary | ICD-10-CM | POA: Diagnosis not present

## 2021-12-17 LAB — CBC
HCT: 30.6 % — ABNORMAL LOW (ref 36.0–46.0)
Hemoglobin: 9.6 g/dL — ABNORMAL LOW (ref 12.0–15.0)
MCH: 28.2 pg (ref 26.0–34.0)
MCHC: 31.4 g/dL (ref 30.0–36.0)
MCV: 90 fL (ref 80.0–100.0)
Platelets: 411 10*3/uL — ABNORMAL HIGH (ref 150–400)
RBC: 3.4 MIL/uL — ABNORMAL LOW (ref 3.87–5.11)
RDW: 15.4 % (ref 11.5–15.5)
WBC: 13.8 10*3/uL — ABNORMAL HIGH (ref 4.0–10.5)
nRBC: 0 % (ref 0.0–0.2)

## 2021-12-17 MED ORDER — IOHEXOL 9 MG/ML PO SOLN
ORAL | Status: AC
  Administered 2021-12-17: 18:00:00 500 mL
  Filled 2021-12-17: qty 1000

## 2021-12-17 MED ORDER — TRAVASOL 10 % IV SOLN
INTRAVENOUS | Status: AC
  Filled 2021-12-17 (×2): qty 540

## 2021-12-17 MED ORDER — ACETAMINOPHEN 160 MG/5ML PO SOLN
1000.0000 mg | Freq: Four times a day (QID) | ORAL | Status: DC
  Administered 2021-12-17 – 2022-01-09 (×52): 1000 mg via ORAL
  Filled 2021-12-17 (×60): qty 40.6

## 2021-12-17 MED ORDER — IOHEXOL 350 MG/ML SOLN
80.0000 mL | Freq: Once | INTRAVENOUS | Status: AC | PRN
  Administered 2021-12-17: 22:00:00 80 mL via INTRAVENOUS

## 2021-12-17 MED ORDER — IOHEXOL 9 MG/ML PO SOLN
500.0000 mL | ORAL | Status: AC
  Administered 2021-12-17: 18:00:00 500 mL via ORAL

## 2021-12-17 MED ORDER — ALTEPLASE 2 MG IJ SOLR
2.0000 mg | Freq: Once | INTRAMUSCULAR | Status: DC
  Filled 2021-12-17: qty 2

## 2021-12-17 MED ORDER — ALTEPLASE 2 MG IJ SOLR
2.0000 mg | Freq: Once | INTRAMUSCULAR | Status: AC
  Administered 2021-12-17: 12:00:00 2 mg
  Filled 2021-12-17: qty 2

## 2021-12-17 NOTE — Progress Notes (Signed)
PHARMACY - TOTAL PARENTERAL NUTRITION CONSULT NOTE   Indication:  Inability to meet enteral needs  Patient Measurements: Height: _0  (177.8 cm) Weight: 76.5 kg (168 lb 10.4 oz) IBW/kg (Calculated) : 68.5 TPN AdjBW (KG): 79.8 Body mass index is 24.2 kg/m. Usual Weight: 80 kg  Assessment: 65 y.o. 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 She is now extended right colectomy and ileostomy. She developed acute metabolic encephalopathy post-op. She was initiated on TF, but tube became clogged on 12/8 and patient refused replacement, and is not eating enough to maintain nutrition.  Calorie count has been ordered.  CCS ordered TPN to start on 12/12, pharmacy to manage.  Glucose / Insulin: No hx DM. CBGs controlled and SSI d/c'd on 12/24. Electrolytes: Lytes WNL except CorrCa (11.2) remains elevated but trending down. Ca removed in TPN on 12/15. Renal: SCr < 1 stable, BUN 26 Hepatic: LFTs, Alk Phos, T.bili all WNL. Albumin low Trig: WNL Intake / Output: UOP good at 2.4 L yesterday, stool output down to 425 mL MIVF: none GI Imaging: GI Surgeries / Procedures:  11/27: Expiratory laparotomy, extended right colectomy, end ileostomy  Central access: PICC placed 12/11 TPN start date: 12/12  Nutritional Goals: Goal TPN rate is 80 mL/hr (provides 105 g of protein and 1977 kcals per day)  RD Assessment: Estimated Needs Total Energy Estimated Needs: 1950-2150 Total Protein Estimated Needs: 95-105g Total Fluid Estimated Needs: 2L/day  Current Nutrition:  Regular diet- Minimal intake  Ensure Enlive TID (20 g of protein and 350 kcal) *Intake not consistent Juven supplement packet (2.5g of protein and 95 kcal) *Intake not consistent Half rate cyclic TPN - 0,131 mL provides 54g  protein and 1094 kcal per day. Attempting to stimulate appetite during the day.    Plan:  At 43:88:  Continue cyclic TPN. Infuse 1,000 mL over 14 hrs: 38 mL/hr x 1 hr, then 77 mL/hr x 12 hrs, then 38 mL/hr x 1 hr. Electrolytes in TPN. No change Na 93mq/L K 531m/L Ca 20m48mL Mg 5mE86m Phos 15mm48m Cl:Ac 1:2 Continue multivitamin PO Thiamine per neurology Monitor TPN labs on Mon/Thurs Follow up calorie counts and/or PEG tube discussions to meet nutritional needs with enteral nutrition.  NathaElenor QuinonesrmD, BCPS, BCIDP Clinical Pharmacist 12/17/2021 7:26 AM

## 2021-12-17 NOTE — Consult Note (Signed)
Patient Identification:  [name] Date of Evaluation:  12/17/2021 Referring Provider: Saverio Danker  History of Present Illness: Sherry Hampton is a 65 year old female with past history of anemia, rheumatoid arthritis, cataracts, GERD, and H&P.  Patient admitted on 1127 with sudden onset of abdominal pain and distention found to have perforated viscus and underwent surgical repair.  Patient also had severe malnutrition, anorexic and during hospitalization not making progress.  Psychiatry consult was called to determine capacity.  Patient has confusion, ataxia, memory issues.  Patient seen by neurology.  Patient seen and chart reviewed.  I also spoke to her brother who was present at the bedside.  Patient appears to be in a lot of pain while talking to the writer.  However she is not agitated, angry.  She appears pleasant but also confused.  She does not know about her location and reason but endorsed that she is in a lot of pain.  Her brother reported that patient has memory issues which slowly and gradually declining but she lives by herself when she walked in by herself to the hospital with pain.  She lives by herself.  Patient denies any depression, hallucination, paranoia but she remains very tearful during the conversation.  At times she has severe thought blocking and nothing to answer the question and repeatedly question multiple times.  In the building she did not remember her brother's name but later she recall.  She does not know where she lives.  She reported that she is married but she has no children which was confirmed from her brother.  As per brother patient cannot recall any previous history of psychiatric illness.  She gets easily distracted and confused.  She reported feeling weak and does not have the strength.  She also not eating well.   Past Psychiatric History: As per brother patient has no history of previous psychiatric illness.   Past Medical History:     Past Medical History:   Diagnosis Date   Anemia    past hx    Arthritis    wrists , knees,  feet    Cataract    cordical cataracts per pt    Constipation    takes a stool softener  daily- as long as does this has daily soft stools    GERD (gastroesophageal reflux disease)    Hemorrhoids    Hyperlipidemia        Past Surgical History:  Procedure Laterality Date   CYSTECTOMY  1972   left buttock with spinal    HEMORRHOID BANDING  02/2015   [facility]    LAPAROSCOPIC SALPINGO OOPHERECTOMY Left 07/1985   right repair    LAPAROTOMY N/A 11/17/2021   Procedure: EXPLORATORY LAPAROTOMY, EXTENDED RIGHT COLECTOMY, END ILEOSTOMY, MUCOUS FISTULA;  Surgeon: [name], MD;  Location: [facility];  Service: General;  Laterality: N/A;   PARTIAL COLECTOMY N/A 11/17/2021   Procedure: PARTIAL COLECTOMY;  Surgeon: [name], MD;  Location: [facility];  Service: General;  Laterality: N/A;    Allergies: No Known Allergies  Current Medications:  Prior to Admission medications   Medication Sig Start Date End Date Taking? Authorizing Provider  aspirin EC 81 MG tablet Take 162 mg by mouth daily as needed (headache).   Yes [provider]  Biotin 1000 MCG tablet Take 1,000 mcg daily by mouth.   Yes [provider]  Cholecalciferol 5000 units capsule Take 5,000 Units by mouth daily. 07/10/15  Yes [provider]  Cyanocobalamin (VITAMIN B-12 PO) Take 1 tablet  by mouth daily.   Yes [provider]  folic acid (FOLVITE) 357 MCG tablet Take 400 mcg daily by mouth.   Yes [provider]  methotrexate 2.5 MG tablet Take 12.5 mg by mouth See admin instructions. 12.5mg  every Wednesday, and 12.5mg  every Thursday 09/09/17  Yes [provider]  rosuvastatin (CRESTOR) 10 MG tablet Take 10 mg by mouth at bedtime. 08/27/21  Yes [provider]    Social History:    reports that she has quit smoking. She has never used smokeless tobacco. She reports that she does not drink alcohol and  does not use drugs.   Family History:    Family History  Problem Relation Age of Onset   Colon polyps Father    Esophageal cancer Brother    Colon cancer Paternal Grandmother    Rectal cancer Neg Hx    Stomach cancer Neg Hx    Pancreatic cancer Neg Hx    Is the patient at risk to self? No. Has the patient been a risk to self in the past 6 months? No. Has the patient been a risk to self within the distant past? No. Is the patient a risk to others No. Has the patient been a risk to others in the past 6 months? No. Has the patient been a risk to others within the distant past? No.    Psychiatric Specialty Exam: Physical Exam Neurological:     Mental Status: She is alert.    Review of Systems  Constitutional:  Positive for appetite change and fatigue.  Psychiatric/Behavioral:  Positive for confusion, dysphoric mood and sleep disturbance.    Blood pressure (!) 108/58, pulse (!) 108, temperature 98.6 F (37 C), temperature source Oral, resp. rate 18, height 5\' 10"  (1.778 m), weight 76.5 kg, SpO2 98 %.Body mass index is 24.2 kg/m.  General Appearance: Casual  Eye Contact:  Fair  Speech:  Slow  Volume:  Decreased  Mood:  Dysphoric  Affect:  Congruent  Thought Process:  Irrelevant  Orientation:  NA  Thought Content:   thought blocking. No delusion, hallucinations  Suicidal Thoughts:  No  Homicidal Thoughts:  No  Memory:  Immediate;   Poor Recent;   Poor Remote;   Poor  Judgement:  Impaired  Insight:  Lacking  Psychomotor Activity:  Decreased  Concentration:  Concentration: Poor and Attention Span: Poor  Recall:  Poor  Fund of Knowledge:  Poor  Language:  Fair  Akathisia:  No  Handed:  Right  AIMS (if indicated):     Assets:  Housing  ADL's:  Impaired  Cognition:  Impaired,  Moderate  Sleep:   fair        Assessment/Plan: Patient is 65 year old female with multiple health issues.  Currently appears confused, incoherent with thought blocking.  Neurology had  consult due to ataxia and confusion.  At this time patient does not have capacity to participate in her treatment plan.  She appears to be delirious.  Recommendation; Treat the underlying cause for delirium.  Avoid benzodiazepine.  Consider trial of mirtazapine 7.5 mg -15 mg to help her depression, insomnia and increased appetite.  Please call psychiatry if there worsening of signs and symptoms.  Thank you for the requesting a consult.   Berniece Andreas, MD Psychiatrist

## 2021-12-17 NOTE — Progress Notes (Signed)
30 Days Post-Op   Subjective/Chief Complaint: No new complaints.  Patient states she would not be agreeable to gastrostomy tube, but not sure she is competent enough to make her own decisions.   Objective: Vital signs in last 24 hours: Temp:  [98 F (36.7 C)-99.1 F (37.3 C)] 99.1 F (37.3 C) (12/26 2120) Pulse Rate:  [77-100] 77 (12/26 2120) Resp:  [18] 18 (12/26 2120) BP: (93-113)/(61-63) 113/63 (12/26 2120) SpO2:  [97 %] 97 % (12/26 2120) Weight:  [76.5 kg] 76.5 kg (12/27 0500) Last BM Date: 12/16/21  Intake/Output from previous day: 12/26 0701 - 12/27 0700 In: 1650.7 [P.O.:660; I.V.:940.7; IV Piggyback:50] Out: 2825 [Urine:2400; Stool:425] Intake/Output this shift: No intake/output data recorded.   General: pleasant, WD, elderly female who is in bed and in NAD Abd: soft, NT, ND, mucus fistula viable with some thin drainage, ileostomy viable with stool and gas in ostomy appliance, midline wound with mostly healthy granulation tissue, but draining purulent drainage from the inferior and superior aspect of the wound, otherwise good granulation tissue. Psych: confused at times and asks questions that don't make sense.  Was able to tell me her brother came by yesterday.  Lab Results:  No results for input(s): WBC, HGB, HCT, PLT in the last 72 hours. BMET Recent Labs    12/16/21 0304  NA 140  K 3.5  CL 110  CO2 25  GLUCOSE 121*  BUN 21  CREATININE 0.37*  CALCIUM 10.0   PT/INR No results for input(s): LABPROT, INR in the last 72 hours. ABG No results for input(s): PHART, HCO3 in the last 72 hours.  Invalid input(s): PCO2, PO2  Studies/Results: No results found.  Anti-infectives: Anti-infectives (From admission, onward)    Start     Dose/Rate Route Frequency Ordered Stop   11/25/21 1000  piperacillin-tazobactam (ZOSYN) IVPB 3.375 g  Status:  Discontinued        3.375 g 12.5 mL/hr over 240 Minutes Intravenous Every 8 hours 11/25/21 0909 12/01/21 0822    11/17/21 1830  piperacillin-tazobactam (ZOSYN) IVPB 3.375 g        3.375 g 12.5 mL/hr over 240 Minutes Intravenous Every 8 hours 11/17/21 1415 11/22/21 2359   11/17/21 1230  piperacillin-tazobactam (ZOSYN) IVPB 3.375 g        3.375 g 100 mL/hr over 30 Minutes Intravenous  Once 11/17/21 1220 11/17/21 1304       Assessment/Plan: POD 30, 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 Harlow Asa 11/27 - path: necrotic colon at site of perforation, no malignancy or dysplasia noted  - CT 12/4 w/ trace peripancreatic and right paracolic gutter simple free fluid that extends inferiorly into the right pelvis with associated peritoneal wall enhancement/thickening. No organized drainable fluid collection.  - Restarted abx 12/5. WBC normalized. Abx stopped. - Minimal oral intake. Was on cortrak + TF 12/7-8. Cortrak became clogged and patient refused replacement 12/8. - Ileostomy output improved. Off Imodium. Cont BID metamucil - WOC following for ileostomy, mucous fistula; d/c wound vac 12/14. wet to dry due to concern for wound breakdown.  Monitor. continue dressing change TID - Mobilize. PT/OT recommend SNF, TOC consulted - Pathology benign. CT distal descending/proximal sigmoid bowel luminal collapse -- radiology recommending colonoscopy as outpatient.    FEN - Regular diet as tolerated, ensure TID, TPN. Cycle TPN at night to try to stimulate appetite and continue Mirtazapine, pt only taking in 10% of calories orally,  ?PEG placement  but patient not agreeable at this time.   VTE - SCDs, Lovenox ID - zosyn 11/27 - 12/2. 12/5-12/11 Foley - purewick, would prefer patient be getting up at least to [facility]   Encephalopathy/FTT - Patient has been on folic acid and thiamine supplements. Noted to have poor motor control and coordination the other day by PT which is different from last week per therapy notes, Neurology consulted  -appreciated their input. No further workup needed at this time from their standpoint.   -will have psych see her today for cognition evaluation as well as competency.  Patient has been worsening since admission.  She was confused from POD 1 on.  Question if she was like this prior to admission, but lived alone and no one aware of these issues.   Disposition: Poor oral intake.  Unable to d/c TPN at this time     LOS: 30 days    Henreitta Cea 12/17/2021

## 2021-12-18 MED ORDER — TRAVASOL 10 % IV SOLN
INTRAVENOUS | Status: AC
  Filled 2021-12-18: qty 540

## 2021-12-18 MED ORDER — MIRTAZAPINE 15 MG PO TABS
15.0000 mg | ORAL_TABLET | Freq: Every day | ORAL | Status: DC
  Administered 2021-12-18 – 2022-01-08 (×19): 15 mg via ORAL
  Filled 2021-12-18 (×20): qty 1

## 2021-12-18 MED ORDER — DAKINS (1/4 STRENGTH) 0.125 % EX SOLN
Freq: Three times a day (TID) | CUTANEOUS | Status: AC
  Administered 2021-12-19 (×2): 1
  Filled 2021-12-18: qty 473

## 2021-12-18 NOTE — Progress Notes (Signed)
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 Harlow Asa 11/27  - CT 12/4 w/ trace peripancreatic and right paracolic gutter simple free fluid that extends inferiorly into the right pelvis with associated peritoneal wall enhancement/thickening. No organized drainable fluid collection.   - Restarted abx 12/5. WBC normalized. Abx stopped.  - Minimal oral intake. Was on cortrak + TF 12/7-8. Cortrak became clogged and patient refused replacement 12/8.  - Ileostomy output improved. Off Imodium. Cont BID metamucil  - WOC following for ileostomy, mucous fistula; d/c wound vac 12/14. and start wet to dry given air bubbles noted in wound bed. Monitor.  - purulent drainage in wound noted, increase dressing change to TID  - Mobilize. PT/OT recommend SNF, TOC consulted  - Pathology benign.   OT comments  Patient was noted to have increased confusion with patient demonstrating difficulty sequencing and attending to task with simple grooming tasks on this date. Patient was handed wash cloth with patient reporting ok im done without moving hand to face. Patient was provided with occasional hand over hand to attempt to get patient to engage in task. Patient was also noted to have leaning to R side in bed. Patient was resistive to repositioning with noted pushing on bed rails when asked to attempt to help with rolling. Patient was max A x 2 for repositioning and pillows to maintain midline. Nursing consulted over increased confusion and ability to engage in tasks on this date. Patient's discharge plan remains appropriate at this time. OT will continue to follow acutely.     Recommendations for follow up therapy are one component of a multi-disciplinary discharge planning process, led by the  attending physician.  Recommendations may be updated based on patient status, additional functional criteria and insurance authorization.    Follow Up Recommendations  Skilled nursing-short term rehab (<3 hours/day)    Assistance Recommended at Discharge Frequent or constant Supervision/Assistance  Equipment Recommendations  None recommended by OT    Recommendations for Other Services      Precautions / Restrictions Precautions Precautions: Fall Precaution Comments: EXPLORATORY LAPAROTOMY, EXTENDED RIGHT COLECTOMY, END ILEOSTOMY, MUCOUS FISTULA (Abdomen)  PARTIAL COLECTOMY Restrictions Weight Bearing Restrictions: No       Mobility Bed Mobility Overal bed mobility: Needs Assistance Bed Mobility: Rolling Rolling: Max assist         General bed mobility comments: patient was max A for rolling on this date with increased cues needed to get patient to engage in tasks. patient was noted to be leaning to R side in bed upon entry with patient resistive to repositioning with patient noted to push back with attempts to come into midline sitting in bed. patient was max A x2 to bet repositioned in bed with midline posture utilizing pillows to better be able to engage in ADL tasks.    Transfers                         Balance                                           ADL either performed or assessed with clinical judgement   ADL Overall ADL's : Needs assistance/impaired  Grooming: Dance movement psychotherapist;Bed level;Moderate assistance Grooming Details (indicate cue type and reason): paitent was noted to be easily distracted during session with patient reporting having participated in washing face when not having moved UE. Upper Body Bathing: Maximal assistance;Bed level Upper Body Bathing Details (indicate cue type and reason): patient was max A for UB washing with occasional hand over hand to intiate tasks. nurse consulted over change in ability to paticipate in  these tasks.     Upper Body Dressing : Minimal assistance;Bed level Upper Body Dressing Details (indicate cue type and reason): to don hospital gown with increased verbal and tactile cues for attending to task, sequencing and safety with lines                        Extremity/Trunk Assessment              Vision       Perception     Praxis      Cognition Arousal/Alertness: Awake/alert Behavior During Therapy: Flat affect Overall Cognitive Status: Impaired/Different from baseline                                 General Comments: patient was more confused on this date with inability to follow simple commands with increased time. patient unable to attend to grooming tasks without max A. noted to be frustrated with increased need for assistance at time during session as well.          Exercises     Shoulder Instructions       General Comments      Pertinent Vitals/ Pain       Pain Assessment: Faces Faces Pain Scale: Hurts little more Pain Location: abdomen with all movement Pain Descriptors / Indicators: Discomfort Pain Intervention(s): Limited activity within patient's tolerance;Monitored during session;Repositioned  Home Living                                          Prior Functioning/Environment              Frequency  Min 2X/week        Progress Toward Goals  OT Goals(current goals can now be found in the care plan section)  Progress towards OT goals: Not progressing toward goals - comment (patient was noted to have new onset of increased confusion with attempts to paticipate in basic grooming tasks.)     Plan Discharge plan remains appropriate    Co-evaluation                 AM-PAC OT "6 Clicks" Daily Activity     Outcome Measure   Help from another person eating meals?: A Little Help from another person taking care of personal grooming?: A Lot Help from another person toileting, which  includes using toliet, bedpan, or urinal?: Total Help from another person bathing (including washing, rinsing, drying)?: A Lot Help from another person to put on and taking off regular upper body clothing?: A Lot Help from another person to put on and taking off regular lower body clothing?: A Lot 6 Click Score: 12    End of Session    OT Visit Diagnosis: Unsteadiness on feet (R26.81);Other symptoms and signs involving cognitive function;Pain;History of falling (Z91.81);Repeated falls (R29.6);Muscle weakness (generalized) (M62.81)   Activity Tolerance Patient  tolerated treatment well   Patient Left in chair;with call bell/phone within reach;with bed alarm set   Nurse Communication Other (comment) (increased confusion and difficulty with UB tasks)        Time: 2258-3462 OT Time Calculation (min): 25 min  Charges: OT General Charges $OT Visit: 1 Visit OT Treatments $Self Care/Home Management : 23-37 mins  Jackelyn Poling OTR/L, MS Acute Rehabilitation Department Office# [phone] Pager# [phone]   Marcellina Millin 12/18/2021, 9:38 AM

## 2021-12-18 NOTE — Progress Notes (Signed)
PHARMACY - TOTAL PARENTERAL NUTRITION CONSULT NOTE   Indication:  Inability to meet enteral needs  Patient Measurements: Height: '5\' 10"'  (177.8 cm) Weight: 76.5 kg (168 lb 10.4 oz) IBW/kg (Calculated) : 68.5 TPN AdjBW (KG): 79.8 Body mass index is 24.2 kg/m. Usual Weight: 80 kg  Assessment: 65 y.o. 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 She is now extended right colectomy and ileostomy. She developed acute metabolic encephalopathy post-op. She was initiated on TF, but tube became clogged on 12/8 and patient refused replacement, and is not eating enough to maintain nutrition.  Calorie count has been ordered.  CCS ordered TPN to start on 12/12, pharmacy to manage.  Glucose / Insulin: No hx DM. CBGs controlled and SSI d/c'd on 12/24. Electrolytes: Lytes WNL except CorrCa (11.2) remains elevated but trending down. Ca removed in TPN on 12/15. Renal: SCr < 1 stable, BUN 26 Hepatic: LFTs, Alk Phos, T.bili all WNL. Albumin low Trig: WNL Intake / Output: UOP good at 2.2 L yesterday, stool output down to 250 mL MIVF: none GI Imaging: GI Surgeries / Procedures:  11/27: Expiratory laparotomy, extended right colectomy, end ileostomy  Central access: PICC placed 12/11 TPN start date: 12/12  Nutritional Goals:   RD Assessment: Estimated Needs Total Energy Estimated Needs: 1950-2150 Total Protein Estimated Needs: 95-105g Total Fluid Estimated Needs: 2L/day  Current Nutrition:  Regular diet- Minimal intake  Ensure Enlive TID (20 g of protein and 350 kcal) *Intake not consistent Juven supplement packet (2.5g of protein and 95 kcal) *Intake not consistent Half rate cyclic TPN - 5,301 mL provides 54g protein and 1094 kcal per day. Attempting to stimulate appetite during the day.     Plan:  At 04:04:  Continue cyclic TPN giving ~59% of nutritional needs. Infuse 1,000 mL over 14 hrs: 38 mL/hr x 1 hr, then 77 mL/hr x 12 hrs, then 38 mL/hr x 1 hr. Electrolytes in TPN. No change Na 83mq/L K 588m/L Ca 71m54mL Mg 5mE69m Phos 15mm471m Cl:Ac 1:2 Continue multivitamin PO Thiamine per neurology Monitor TPN labs on Mon/Thurs Follow up calorie counts and/or PEG tube discussions  NathaElenor QuinonesrmD, BCPS, BCIDP Clinical Pharmacist 12/18/2021 8:09 AM

## 2021-12-18 NOTE — Progress Notes (Signed)
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 Harlow Asa 11/27.  Per recent MD notes: "Encephalopathy/FTT - Patient has been on folic acid and thiamine supplements. Noted to have poor motor control and coordination the other day by PT which is different from last week per therapy notes, Neurology consulted - appreciated their input. No further workup needed at this time from their standpoint.    - psych saw 12/27 and determined patient does not have capacity. Patient has been worsening since admission.  She was confused from POD 1 on. Question if she was like this prior to admission, but lived alone and no one aware of these issues."    PT Comments    Pt presents with impaired cognition and requiring increased assist for bed mobility today.  Pt was not orientated and required multimodal cues in order to participate.  Pt at least max +2 assist at this time.  Per most recent progress notes (psych and surgery), pt does not have capacity to make decisions, and pt's brother would like to see pt well enough to transfer care to [place].  Pt would benefit from Palliative consult.     Recommendations for follow up therapy are one component of a multi-disciplinary discharge planning process, led by the attending physician.  Recommendations may be updated based on patient status, additional functional criteria and insurance authorization.  Follow Up Recommendations  Skilled nursing-short term rehab (<3 hours/day)     Assistance Recommended at Discharge Frequent or constant Supervision/Assistance  Equipment Recommendations  None recommended by PT    Recommendations for Other Services       Precautions / Restrictions Precautions Precautions:  Fall Precaution Comments: abdominal surgery, RUQ ileostomy     Mobility  Bed Mobility Overal bed mobility: Needs Assistance Bed Mobility: Rolling;Sidelying to Sit;Sit to Sidelying Rolling: Max assist;+2 for physical assistance Sidelying to sit: Max assist;+2 for physical assistance     Sit to sidelying: Max assist;+2 for physical assistance General bed mobility comments: mutlimodal cues for log roll technique, pt requiring assist to initiate and complete bed mobility    Transfers                   General transfer comment: deferred for safety    Ambulation/Gait                   Stairs             Wheelchair Mobility    Modified Rankin (Stroke Patients Only)       Balance Overall balance assessment: Needs assistance   Sitting balance-Leahy Scale: Poor Sitting balance - Comments: pt with increased right lateral lean; pt assisted with propping onto left forearm and able to maintain this position without assist, pt also able to briefly bring trunk upright and maintain approx 30 sec, however would fatigue into right lateral lean requiring assist to correct; performed 4 left forearm propping in sitting with pt coming into upright position until laterally lean to right; Postural control: Right lateral lean                                  Cognition Arousal/Alertness: Awake/alert   Overall Cognitive Status: Impaired/Different from baseline  Orientation Level: Situation;Time;Disoriented to;Place   Memory: Decreased recall of precautions;Decreased short-term memory Following Commands: Follows one step commands inconsistently Safety/Judgement: Decreased awareness of safety;Decreased awareness of deficits   Problem Solving: Slow processing;Decreased initiation;Difficulty sequencing;Requires verbal cues General Comments: pt reorientated as she could not answer orientation questions, pt states yes to questions and  ability to assist however poor initiation requiring multimodal cues; emotionally labile during session (which is new per rehab tech who has been assisting pt with past sessions)        Exercises      General Comments        Pertinent Vitals/Pain Pain Assessment: Faces Faces Pain Scale: Hurts little more Pain Location: abdomen with all movement Pain Descriptors / Indicators: Guarding;Grimacing Pain Intervention(s): Repositioned;Monitored during session    Home Living                          Prior Function            PT Goals (current goals can now be found in the care plan section) Acute Rehab PT Goals PT Goal Formulation: Patient unable to participate in goal setting Time For Goal Achievement: 01/01/22 Potential to Achieve Goals: Fair Progress towards PT goals: Not progressing toward goals - comment (cognition limiting)    Frequency    Min 2X/week      PT Plan Discharge plan needs to be updated    Co-evaluation              AM-PAC PT "6 Clicks" Mobility   Outcome Measure  Help needed turning from your back to your side while in a flat bed without using bedrails?: A Lot Help needed moving from lying on your back to sitting on the side of a flat bed without using bedrails?: Total Help needed moving to and from a bed to a chair (including a wheelchair)?: Total Help needed standing up from a chair using your arms (e.g., wheelchair or bedside chair)?: Total Help needed to walk in hospital room?: Total Help needed climbing 3-5 steps with a railing? : Total 6 Click Score: 7    End of Session   Activity Tolerance: Patient limited by fatigue;Other (comment) (limited by cognition) Patient left: in bed;with call bell/phone within reach;with bed alarm set Nurse Communication: Mobility status PT Visit Diagnosis: Muscle weakness (generalized) (M62.81);Other abnormalities of gait and mobility (R26.89)     Time: 6153-7943 PT Time Calculation (min)  (ACUTE ONLY): 20 min  Charges:  $Therapeutic Activity: 8-22 mins                    Jannette Spanner PT, DPT Acute Rehabilitation Services Pager: [phone] Office: [place] 12/18/2021, 5:26 PM

## 2021-12-18 NOTE — Progress Notes (Signed)
Progress Note  31 Days Post-Op  Subjective: No significant change. Patient confused and made no comment on gastrostomy tube today, but did agree that I could speak about this with her and her brother. No family at bedside this AM but will check back this afternoon.   Objective: Vital signs in last 24 hours: Temp:  [97.8 F (36.6 C)-98.6 F (37 C)] 97.8 F (36.6 C) (12/28 0456) Pulse Rate:  [86-108] 86 (12/28 0456) Resp:  [15-18] 15 (12/28 0456) BP: (108-125)/(58-69) 125/68 (12/28 0456) SpO2:  [98 %] 98 % (12/28 0456) Last BM Date: 12/17/21  Intake/Output from previous day: 12/27 0701 - 12/28 0700 In: 1368.8 [P.O.:480; I.V.:888.8] Out: 2475 [Urine:2225; Stool:250] Intake/Output this shift: No intake/output data recorded.  PE: General: pleasant, WD, elderly female who is in bed and in NAD Abd: soft, NT, ND, mucus fistula viable with some thin drainage, ileostomy viable with stool and gas in ostomy appliance, midline wound with mostly healthy granulation tissue, but draining purulent drainage from the inferior and superior aspect of the wound, otherwise good granulation tissue. Psych: confused at times and asks questions that don't make sense.  Was able to tell me her brother came by yesterday.   Lab Results:  Recent Labs    12/17/21 1409  WBC 13.8*  HGB 9.6*  HCT 30.6*  PLT 411*   BMET Recent Labs    12/16/21 0304  NA 140  K 3.5  CL 110  CO2 25  GLUCOSE 121*  BUN 21  CREATININE 0.37*  CALCIUM 10.0   PT/INR No results for input(s): LABPROT, INR in the last 72 hours. CMP     Component Value Date/Time   NA 140 12/16/2021 0304   K 3.5 12/16/2021 0304   CL 110 12/16/2021 0304   CO2 25 12/16/2021 0304   GLUCOSE 121 (H) 12/16/2021 0304   BUN 21 12/16/2021 0304   CREATININE 0.37 (L) 12/16/2021 0304   CALCIUM 10.0 12/16/2021 0304   PROT 7.6 12/16/2021 0304   ALBUMIN 2.3 (L) 12/16/2021 0304   AST 13 (L) 12/16/2021 0304   ALT 12 12/16/2021 0304   ALKPHOS  76 12/16/2021 0304   BILITOT 0.4 12/16/2021 0304   GFRNONAA >60 12/16/2021 0304   Lipase     Component Value Date/Time   LIPASE 22 11/17/2021 0955       Studies/Results: CT ABDOMEN PELVIS W CONTRAST  Result Date: 12/17/2021 CLINICAL DATA:  Abdominal pain. 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 EXAM: CT ABDOMEN AND PELVIS WITH CONTRAST TECHNIQUE: Multidetector CT imaging of the abdomen and pelvis was performed using the standard protocol following bolus administration of intravenous contrast. CONTRAST:  35mL OMNIPAQUE IOHEXOL 350 MG/ML SOLN COMPARISON:  CT abdomen pelvis dated 11/24/2021. FINDINGS: Lower chest: Right lung base subsegmental atelectasis. Pneumonia is not excluded. There is coronary vascular calcification. No intra-abdominal free air or free fluid. Hepatobiliary: The liver is unremarkable. No intrahepatic biliary dilatation. No calcified gallstone. Pancreas: Unremarkable. No pancreatic ductal dilatation or surrounding inflammatory changes. Spleen: Normal in size without focal abnormality. Adrenals/Urinary Tract: The adrenal glands are unremarkable. Small nonobstructing left renal calculi. No hydronephrosis. There is a 13 mm left renal upper pole cyst. There is no hydronephrosis or nephrolithiasis on the right. There is symmetric enhancement and excretion of contrast by both kidneys. The visualized ureters and urinary bladder appear unremarkable. Stomach/Bowel: Similar appearance of colonic diverticulosis and thickening of the distal descending/sigmoid colon, likely muscular hypertrophy. Postsurgical changes with  a right lower quadrant ileostomy and left colostomy. No bowel obstruction. 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 Interval decrease in the size of the fluid density inferior to the gallbladder. No drainable fluid collection identified on this CT. Vascular/Lymphatic: The  abdominal aorta and IVC unremarkable. No portal venous gas. There is no adenopathy. Reproductive: Several uterine fibroids. Other: The midline vertical anterior pelvic wall surgical incision. There is a somewhat complex collection containing small pockets of air in the deep subcutaneous soft tissues of the anterior abdominal wall along the midline with extension to the skin at the level of the umbilicus. This collection measures approximately 12 cm in length and 8 mm in AP thickness. Musculoskeletal: Degenerative changes of the spine. No acute osseous pathology. IMPRESSION: 1. Postsurgical changes with a right lower quadrant ileostomy and left colostomy. No bowel obstruction. 2. 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 Interval decrease in the size of the fluid density inferior to the gallbladder. No drainable fluid collection identified on this CT. 3. Complex collection in the deep subcutaneous soft tissues of the anterior abdominal wall along the midline extending to the skin at the level of the umbilicus. 4. Right lung base subsegmental atelectasis. Pneumonia is not excluded. Electronically Signed   By: Anner Crete M.D.   On: 12/17/2021 22:18    Anti-infectives: Anti-infectives (From admission, onward)    Start     Dose/Rate Route Frequency Ordered Stop   11/25/21 1000  piperacillin-tazobactam (ZOSYN) IVPB 3.375 g  Status:  Discontinued        3.375 g 12.5 mL/hr over 240 Minutes Intravenous Every 8 hours 11/25/21 0909 12/01/21 0822   11/17/21 1830  piperacillin-tazobactam (ZOSYN) IVPB 3.375 g        3.375 g 12.5 mL/hr over 240 Minutes Intravenous Every 8 hours 11/17/21 1415 11/22/21 2359   11/17/21 1230  piperacillin-tazobactam (ZOSYN) IVPB 3.375 g        3.375 g 100 mL/hr over 30 Minutes Intravenous  Once 11/17/21 1220 11/17/21 1304        Assessment/Plan POD 31, S/p extended right colectomy with ileostomy for perforated right colon with  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 Harlow Asa 11/27 - path: necrotic colon at site of perforation, no malignancy or dysplasia noted  - CT 12/4 w/ trace peripancreatic and right paracolic gutter simple free fluid that extends inferiorly into the right pelvis with associated peritoneal wall enhancement/thickening. No organized drainable fluid collection.  - Restarted abx 12/5. WBC normalized. Abx stopped. - Minimal oral intake. Was on cortrak + TF 12/7-8. Cortrak became clogged and patient refused replacement 12/8. - Ileostomy output improved. Off Imodium. Cont BID metamucil - WOC following for ileostomy, mucous fistula; CT 12/28 with complex collection at incision, probed some today and got return of purulent drainage - some odor and green-blue hue, will order Dakin's x72 hrs. Continue TID dressing changes  - Mobilize. PT/OT recommend SNF, TOC consulted - Pathology benign. CT distal descending/proximal sigmoid bowel luminal collapse -- radiology recommending colonoscopy as outpatient.    FEN - Regular diet as tolerated, ensure TID, TPN. Cycle TPN at night to try to stimulate appetite and continue Mirtazapine, pt only taking in 10% of calories orally,  ?PEG placement  but patient not agreeable at this time. Psych evaluated 12/27 and determined that patient does not have capacity to make medical decisions at this time. Will discuss with patient's brother today. Considering palliative consult to help  with establishing [place].    VTE - SCDs, Lovenox ID - zosyn 11/27 - 12/2. 12/5-12/11 Foley - purewick, would prefer patient be getting up at least to [facility]   Encephalopathy/FTT - Patient has been on folic acid and thiamine supplements. Noted to have poor motor control and coordination the other day by PT which is different from last week per therapy notes, Neurology consulted - appreciated their input. No further workup needed at this time from their standpoint.   - psych saw 12/27 and determined  patient does not have capacity. Patient has been worsening since admission.  She was confused from POD 1 on. Question if she was like this prior to admission, but lived alone and no one aware of these issues.   Disposition: Poor oral intake.  Unable to d/c TPN at this time   LOS: 31 days    Norm Parcel, [facility] 12/18/2021, 9:26 AM Please see Amion for pager number during day hours 7:00am-4:30pm

## 2021-12-19 ENCOUNTER — Encounter: Payer: Self-pay | Admitting: Surgery

## 2021-12-19 DIAGNOSIS — M2550 Pain in unspecified joint: Secondary | ICD-10-CM | POA: Insufficient documentation

## 2021-12-19 DIAGNOSIS — M179 Osteoarthritis of knee, unspecified: Secondary | ICD-10-CM | POA: Insufficient documentation

## 2021-12-19 DIAGNOSIS — Z932 Ileostomy status: Secondary | ICD-10-CM

## 2021-12-19 DIAGNOSIS — R5383 Other fatigue: Secondary | ICD-10-CM | POA: Insufficient documentation

## 2021-12-19 DIAGNOSIS — Z79899 Other long term (current) drug therapy: Secondary | ICD-10-CM | POA: Insufficient documentation

## 2021-12-19 DIAGNOSIS — K55049 Acute infarction of large intestine, extent unspecified: Secondary | ICD-10-CM | POA: Insufficient documentation

## 2021-12-19 DIAGNOSIS — E669 Obesity, unspecified: Secondary | ICD-10-CM | POA: Insufficient documentation

## 2021-12-19 LAB — COMPREHENSIVE METABOLIC PANEL
ALT: 10 U/L (ref 0–44)
AST: 14 U/L — ABNORMAL LOW (ref 15–41)
Albumin: 2.1 g/dL — ABNORMAL LOW (ref 3.5–5.0)
Alkaline Phosphatase: 74 U/L (ref 38–126)
Anion gap: 7 (ref 5–15)
BUN: 24 mg/dL — ABNORMAL HIGH (ref 8–23)
CO2: 25 mmol/L (ref 22–32)
Calcium: 10.5 mg/dL — ABNORMAL HIGH (ref 8.9–10.3)
Chloride: 108 mmol/L (ref 98–111)
Creatinine, Ser: 0.46 mg/dL (ref 0.44–1.00)
GFR, Estimated: >60 mL/min (ref >60)
Glucose, Bld: 155 mg/dL — ABNORMAL HIGH (ref 70–99)
Potassium: 3.7 mmol/L (ref 3.5–5.1)
Sodium: 140 mmol/L (ref 135–145)
Total Bilirubin: 0.4 mg/dL (ref 0.3–1.2)
Total Protein: 7.7 g/dL (ref 6.5–8.1)

## 2021-12-19 LAB — PHOSPHORUS: Phosphorus: 2.6 mg/dL (ref 2.5–4.6)

## 2021-12-19 LAB — MAGNESIUM: Magnesium: 2 mg/dL (ref 1.7–2.4)

## 2021-12-19 MED ORDER — TRAVASOL 10 % IV SOLN
INTRAVENOUS | Status: AC
  Filled 2021-12-19: qty 540

## 2021-12-19 MED ORDER — FERROUS SULFATE 325 (65 FE) MG PO TABS
325.0000 mg | ORAL_TABLET | Freq: Two times a day (BID) | ORAL | Status: DC
  Administered 2021-12-19 – 2022-01-22 (×50): 325 mg via ORAL
  Filled 2021-12-19 (×57): qty 1

## 2021-12-19 MED ORDER — LOPERAMIDE HCL 2 MG PO CAPS: 2.0000 mg | ORAL_CAPSULE | Freq: Four times a day (QID) | ORAL | Status: DC | PRN

## 2021-12-19 NOTE — TOC Progression Note (Signed)
Transition of Care [facility]) - Progression Note    Patient Details  Name: Sherry Hampton MRN: [id number] Date of Birth: [birth year]  Transition of Care [facility]) CM/SW Contact  Caleel Kiner, Juliann Pulse, RN Phone Number: 12/19/2021, 12:38 PM  Clinical Narrative: Per Psych-patient no capacity to participate in treatment plan-spoke to Leon(brother) in rm contact person listed-answered questions;informed of bed choices already listed-informed of still monitoring best d/c plan closer to medical stability-voiced understanding.Continue to monitor.      Expected Discharge Plan: [place] Barriers to Discharge: Continued Medical Work up, Ship broker, SNF Pending bed offer  Expected Discharge Plan and Services Expected Discharge Plan: [place] In-house Referral: Clinical Social Work     Living arrangements for the past 2 months: Single Family Home                                       Social Determinants of Health (SDOH) Interventions    Readmission Risk Interventions No flowsheet data found.

## 2021-12-19 NOTE — Progress Notes (Signed)
32 Days Post-Op   Subjective/Chief Complaint: Complains of some pain. Ate some food yesterday-mostly liquid   Objective: Vital signs in last 24 hours: Temp:  [97.4 F (36.3 C)-98.8 F (37.1 C)] 97.4 F (36.3 C) (12/29 0600) Pulse Rate:  [94-100] 95 (12/29 0600) Resp:  [18] 18 (12/29 0600) BP: (98-118)/(80-83) 118/81 (12/29 0600) SpO2:  [97 %-98 %] 98 % (12/29 0600) Last BM Date: 12/18/21  Intake/Output from previous day: 12/28 0701 - 12/29 0700 In: 1571.4 [P.O.:480; I.V.:1091.4] Out: 3390 [Urine:2640; Stool:750] Intake/Output this shift: No intake/output data recorded.  General appearance: alert and cooperative Resp: clear to auscultation bilaterally Cardio: regular rate and rhythm GI: soft, moderate diffuse tenderness. Ostomy productive  Lab Results:  Recent Labs    12/17/21 1409  WBC 13.8*  HGB 9.6*  HCT 30.6*  PLT 411*   BMET Recent Labs    12/19/21 0410  NA 140  K 3.7  CL 108  CO2 25  GLUCOSE 155*  BUN 24*  CREATININE 0.46  CALCIUM 10.5*   PT/INR No results for input(s): LABPROT, INR in the last 72 hours. ABG No results for input(s): PHART, HCO3 in the last 72 hours.  Invalid input(s): PCO2, PO2  Studies/Results: CT ABDOMEN PELVIS W CONTRAST  Result Date: 12/17/2021 CLINICAL DATA:  Abdominal pain. 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 EXAM: CT ABDOMEN AND PELVIS WITH CONTRAST TECHNIQUE: Multidetector CT imaging of the abdomen and pelvis was performed using the standard protocol following bolus administration of intravenous contrast. CONTRAST:  54mL OMNIPAQUE IOHEXOL 350 MG/ML SOLN COMPARISON:  CT abdomen pelvis dated 11/24/2021. FINDINGS: Lower chest: Right lung base subsegmental atelectasis. Pneumonia is not excluded. There is coronary vascular calcification. No intra-abdominal free air or free fluid. Hepatobiliary: The liver is unremarkable. No intrahepatic biliary dilatation. No  calcified gallstone. Pancreas: Unremarkable. No pancreatic ductal dilatation or surrounding inflammatory changes. Spleen: Normal in size without focal abnormality. Adrenals/Urinary Tract: The adrenal glands are unremarkable. Small nonobstructing left renal calculi. No hydronephrosis. There is a 13 mm left renal upper pole cyst. There is no hydronephrosis or nephrolithiasis on the right. There is symmetric enhancement and excretion of contrast by both kidneys. The visualized ureters and urinary bladder appear unremarkable. Stomach/Bowel: Similar appearance of colonic diverticulosis and thickening of the distal descending/sigmoid colon, likely muscular hypertrophy. Postsurgical changes with a right lower quadrant ileostomy and left colostomy. No bowel obstruction. 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 Interval decrease in the size of the fluid density inferior to the gallbladder. No drainable fluid collection identified on this CT. Vascular/Lymphatic: The abdominal aorta and IVC unremarkable. No portal venous gas. There is no adenopathy. Reproductive: Several uterine fibroids. Other: The midline vertical anterior pelvic wall surgical incision. There is a somewhat complex collection containing small pockets of air in the deep subcutaneous soft tissues of the anterior abdominal wall along the midline with extension to the skin at the level of the umbilicus. This collection measures approximately 12 cm in length and 8 mm in AP thickness. Musculoskeletal: Degenerative changes of the spine. No acute osseous pathology. IMPRESSION: 1. Postsurgical changes with a right lower quadrant ileostomy and left colostomy. No bowel obstruction. 2. 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 Interval decrease in the size of the fluid density inferior to the gallbladder. No drainable fluid collection identified on this CT. 3. Complex collection  in the deep subcutaneous soft tissues of the anterior  abdominal wall along the midline extending to the skin at the level of the umbilicus. 4. Right lung base subsegmental atelectasis. Pneumonia is not excluded. Electronically Signed   By: Anner Crete M.D.   On: 12/17/2021 22:18    Anti-infectives: Anti-infectives (From admission, onward)    Start     Dose/Rate Route Frequency Ordered Stop   11/25/21 1000  piperacillin-tazobactam (ZOSYN) IVPB 3.375 g  Status:  Discontinued        3.375 g 12.5 mL/hr over 240 Minutes Intravenous Every 8 hours 11/25/21 0909 12/01/21 0822   11/17/21 1830  piperacillin-tazobactam (ZOSYN) IVPB 3.375 g        3.375 g 12.5 mL/hr over 240 Minutes Intravenous Every 8 hours 11/17/21 1415 11/22/21 2359   11/17/21 1230  piperacillin-tazobactam (ZOSYN) IVPB 3.375 g        3.375 g 100 mL/hr over 30 Minutes Intravenous  Once 11/17/21 1220 11/17/21 1304       Assessment/Plan: s/p Procedure(s): EXPLORATORY LAPAROTOMY, EXTENDED RIGHT COLECTOMY, END ILEOSTOMY, MUCOUS FISTULA (N/A) PARTIAL COLECTOMY (N/A) Advance diet POD 31, 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 Harlow Asa 11/27 - path: necrotic colon at site of perforation, no malignancy or dysplasia noted  - CT 12/4 w/ trace peripancreatic and right paracolic gutter simple free fluid that extends inferiorly into the right pelvis with associated peritoneal wall enhancement/thickening. No organized drainable fluid collection.  - Restarted abx 12/5. WBC normalized. Abx stopped. - Minimal oral intake. Was on cortrak + TF 12/7-8. Cortrak became clogged and patient refused replacement 12/8. - Ileostomy output improved. Off Imodium. Cont BID metamucil - WOC following for ileostomy, mucous fistula; CT 12/28 with complex collection at incision, probed some today and got return of purulent drainage - some odor and green-blue hue, will order  Dakin's x72 hrs. Continue TID dressing changes  - Mobilize. PT/OT recommend SNF, TOC consulted - Pathology benign. CT distal descending/proximal sigmoid bowel luminal collapse -- radiology recommending colonoscopy as outpatient.    FEN - Regular diet as tolerated, ensure TID, TPN. Cycle TPN at night to try to stimulate appetite and continue Mirtazapine, pt only taking in 10% of calories orally,  ?PEG placement  but patient not agreeable at this time. Psych evaluated 12/27 and determined that patient does not have capacity to make medical decisions at this time. Will discuss with patient's brother today. Considering palliative consult to help with establishing [place].    VTE - SCDs, Lovenox ID - zosyn 11/27 - 12/2. 12/5-12/11 Foley - purewick, would prefer patient be getting up at least to [facility]   Encephalopathy/FTT - Patient has been on folic acid and thiamine supplements. Noted to have poor motor control and coordination the other day by PT which is different from last week per therapy notes, Neurology consulted - appreciated their input. No further workup needed at this time from their standpoint.   - psych saw 12/27 and determined patient does not have capacity. Patient has been worsening since admission.  She was confused from POD 1 on. Question if she was like this prior to admission, but lived alone and no one aware of these issues.  LOS: 32 days    Sherry Hampton 12/19/2021

## 2021-12-19 NOTE — Progress Notes (Signed)
PHARMACY - TOTAL PARENTERAL NUTRITION CONSULT NOTE  ° °Indication:  Inability to meet enteral needs ° °Patient Measurements: °Height: 5' 10" (177.8 cm) °Weight: 76.5 kg (168 lb 10.4 oz) °IBW/kg (Calculated) : 68.5 °TPN AdjBW (KG): 79.8 °Body mass index is 24.2 kg/m². °Usual Weight: 80 kg ° °Assessment: 65 y.o. 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 She is now extended right colectomy and ileostomy. She developed acute metabolic encephalopathy post-op. She was initiated on TF, but tube became clogged on 12/8 and patient refused replacement, and is not eating enough to maintain nutrition.  Calorie count has been ordered.  CCS ordered TPN to start on 12/12, pharmacy to manage. ° °Glucose / Insulin: No hx DM. CBGs controlled and SSI d/c'd on 12/24. °Electrolytes: CorrCa 12.02 (no Ca on TPN); phos 2.6 °- other lytes wnl °Renal: SCr < 1 stable, BUN 24 °Hepatic: LFTs, Alk Phos, T.bili all WNL. Albumin low °Trig: WNL °Intake / Output: - 1818 °- UOP 2640ml, stool output: 750 ml °MIVF: none °GI Imaging:  °- 12/27 abd CT: Postsurgical changes with a right lower quadrant ileostomy and left colostomy. No bowel obstruction. Scattered areas of hazy density throughout the omentum consistent °with omental infarcts. Interval decrease in the size of the fluid °density inferior to the gallbladder. No drainable fluid collection °Identified. Complex collection in the deep subcutaneous soft tissues of theanterior abdominal wall along the midline extending to the skin at the level of the umbilicus °GI Surgeries / Procedures:  °11/27: Expiratory laparotomy, extended right colectomy, end ileostomy ° °Central access: PICC placed 12/11 °TPN start date: 12/12 ° °Nutritional Goals: ° °RD Assessment: °Estimated Needs °Total Energy  Estimated Needs: 1950-2150 °Total Protein Estimated Needs: 95-105g °Total Fluid Estimated Needs: 2L/day ° °Current Nutrition:  °Regular diet- Minimal intake  °Ensure Enlive TID (20 g of protein and 350 kcal) *Intake not consistent °Juven supplement packet (2.5g of protein and 95 kcal) *Intake not consistent °Half rate cyclic TPN - 1,000 mL provides 54g protein and 1094 kcal per day. Attempting to stimulate appetite during the day.   ° °Plan:  °At 18:00:  °Continue cyclic TPN giving ~50% of nutritional needs. Infuse 1,000 mL over 14 hrs: 38 mL/hr x 1 hr, then 77 mL/hr x 12 hrs, then 38 mL/hr x 1 hr. °Electrolytes in TPN. No change °Na 50mEq/L °K 50mEq/L °Ca 0mEq/L °Mg 5mEq/L °Phos 15mmol/L °Cl:Ac 1:2 °Continue multivitamin PO °Thiamine per neurology °Monitor TPN labs on Mon/Thurs °Follow up calorie counts and/or PEG tube discussions ° °Anh Pham, PharmD, BCPS °12/19/2021 8:02 AM ° °

## 2021-12-20 ENCOUNTER — Inpatient Hospital Stay (HOSPITAL_COMMUNITY): Payer: Medicare HMO

## 2021-12-20 DIAGNOSIS — J9811 Atelectasis: Secondary | ICD-10-CM | POA: Diagnosis not present

## 2021-12-20 DIAGNOSIS — M79605 Pain in left leg: Secondary | ICD-10-CM | POA: Diagnosis not present

## 2021-12-20 DIAGNOSIS — M1712 Unilateral primary osteoarthritis, left knee: Secondary | ICD-10-CM | POA: Diagnosis not present

## 2021-12-20 DIAGNOSIS — R509 Fever, unspecified: Secondary | ICD-10-CM | POA: Diagnosis not present

## 2021-12-20 DIAGNOSIS — M25572 Pain in left ankle and joints of left foot: Secondary | ICD-10-CM | POA: Diagnosis not present

## 2021-12-20 LAB — BASIC METABOLIC PANEL
Anion gap: 5 (ref 5–15)
BUN: 22 mg/dL (ref 8–23)
CO2: 26 mmol/L (ref 22–32)
Calcium: 10.8 mg/dL — ABNORMAL HIGH (ref 8.9–10.3)
Chloride: 107 mmol/L (ref 98–111)
Creatinine, Ser: 0.51 mg/dL (ref 0.44–1.00)
GFR, Estimated: >60 mL/min (ref >60)
Glucose, Bld: 135 mg/dL — ABNORMAL HIGH (ref 70–99)
Potassium: 3.7 mmol/L (ref 3.5–5.1)
Sodium: 138 mmol/L (ref 135–145)

## 2021-12-20 LAB — RESP PANEL BY RT-PCR (FLU A&B, COVID) ARPGX2
Influenza A by PCR: NEGATIVE
Influenza B by PCR: NEGATIVE
SARS Coronavirus 2 by RT PCR: NEGATIVE

## 2021-12-20 LAB — URINALYSIS, ROUTINE W REFLEX MICROSCOPIC
Bilirubin Urine: NEGATIVE
Glucose, UA: NEGATIVE mg/dL
Hgb urine dipstick: NEGATIVE
Ketones, ur: NEGATIVE mg/dL
Leukocytes,Ua: NEGATIVE
Nitrite: NEGATIVE
Protein, ur: NEGATIVE mg/dL
Specific Gravity, Urine: 1.024 (ref 1.005–1.030)
pH: 5 (ref 5.0–8.0)

## 2021-12-20 LAB — CBC
HCT: 32.5 % — ABNORMAL LOW (ref 36.0–46.0)
Hemoglobin: 10.1 g/dL — ABNORMAL LOW (ref 12.0–15.0)
MCH: 27.7 pg (ref 26.0–34.0)
MCHC: 31.1 g/dL (ref 30.0–36.0)
MCV: 89.3 fL (ref 80.0–100.0)
Platelets: 402 10*3/uL — ABNORMAL HIGH (ref 150–400)
RBC: 3.64 MIL/uL — ABNORMAL LOW (ref 3.87–5.11)
RDW: 15.7 % — ABNORMAL HIGH (ref 11.5–15.5)
WBC: 14 10*3/uL — ABNORMAL HIGH (ref 4.0–10.5)
nRBC: 0 % (ref 0.0–0.2)

## 2021-12-20 LAB — URIC ACID: Uric Acid, Serum: 2.8 mg/dL (ref 2.5–7.1)

## 2021-12-20 MED ORDER — TRAVASOL 10 % IV SOLN
INTRAVENOUS | Status: AC
  Filled 2021-12-20: qty 540

## 2021-12-20 NOTE — Progress Notes (Signed)
Notified by RN of LLE pain with movement of extremity this AM which is new for patient. Patient denies pain in LLE at rest and is able to move L foot and sensation appears intact. No edema or erythema of LLE. Calf is soft and NT on palpation. No edema at knee joint or ankle joint. Pain primarily noted with dorsiflexion of left foot. Patient currently on lovenox 40 mg SQ daily. Sister and brother at bedside. Ordered CBC and BMET and vascular US to rule in or out LLE DVT.  Norm Parcel, [facility] 12/20/2021, 12:04 PM Please see Amion for pager number during day hours 7:00am-4:30pm

## 2021-12-20 NOTE — Progress Notes (Signed)
Nutrition Follow-up  DOCUMENTATION CODES:   Severe malnutrition in context of acute illness/injury  INTERVENTION:   -Cyclic TPN management per Pharmacy -Continue thiamine supplementation   -Ensure Enlive po BID, each supplement provides 350 kcal and 20 grams of protein -Pt prefers this in strawberry   -Milk served with every meal tray (pt enjoys 2% -provides 120 kcals and 8g protein per carton)   -Juven, each serving provides 95kcal and 2.5g of protein (amino acids glutamine and arginine)   If PEG placed: -Initiate Osmolite 1.5 @ 20 ml/hr and advance by 10 ml every 8 hours to goal rate of 60 ml/hr. -provides 2160 kcals, 90g protein and 1097 ml H2O -Will need free water flushes of 200 ml every 6 hours (800 ml)   NUTRITION DIAGNOSIS:   Severe Malnutrition related to acute illness as evidenced by energy intake < or equal to 50% for > or equal to 5 days, moderate fat depletion, severe muscle depletion.  Ongoing.  GOAL:   Patient will meet greater than or equal to 90% of their needs  Progressing  MONITOR:   PO intake, Supplement acceptance, Labs, Weight trends, I & O's (TPN)  ASSESSMENT:   65 year old female who presents to the emergency department with sudden onset of abdominal pain and distention.CT scan of abdomen and pelvis shows findings consistent with free air with a large pneumoperitoneum.  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  Patient still not meeting needs PO, sometimes drinking supplements. Not consumed at all yesterday.  Noted that palliative care consult placed for GOC. PEG is still being considered. Pt was deemed by psych to not have capacity to make medical decisions.   If pt to continue full code and PEG not placed, will need to increase TPN to meet more of needs.Will monitor for what is decided in [place] meeting.  Will leave tube feed recommendations if PEG is pursued.  Cyclic TPN continues x 14 hours, providing 1094  kcals and 54g protein.  Admission weight: 176 lbs. Last recorded weight 12/27: 168 lbs  Medications: Ferrous sulfate, Folic acid, Remeron, Multivitamin with minerals daily, Metamucil, Thiamine  Labs reviewed.   Diet Order:   Diet Order             DIET SOFT Room service appropriate? Yes; Fluid consistency: Thin  Diet effective now                   EDUCATION NEEDS:   Education needs have been addressed  Skin:  Skin Assessment: Skin Integrity Issues: Skin Integrity Issues:: Incisions Incisions: 11/27 abdomen  Last BM:  12/30 -ileostomy  Height:   Ht Readings from Last 1 Encounters:  11/17/21 5\' 10"  (1.778 m)    Weight:   Wt Readings from Last 1 Encounters:  12/17/21 76.5 kg    BMI:  Body mass index is 24.2 kg/m.  Estimated Nutritional Needs:   Kcal:  1950-2150  Protein:  95-105g  Fluid:  2L/day  Clayton Bibles, MS, RD, LDN Inpatient Clinical Dietitian Contact information available via Amion

## 2021-12-20 NOTE — Progress Notes (Signed)
Left lower extremity venous duplex has been completed. Preliminary results can be found in CV Proc through chart review.   12/20/21 1:29 PM Sherry Hampton RVT

## 2021-12-20 NOTE — Progress Notes (Signed)
33 Days Post-Op   Subjective/Chief Complaint: No complaints this am   Objective: Vital signs in last 24 hours: Temp:  [98.3 F (36.8 C)-98.6 F (37 C)] 98.6 F (37 C) (12/30 0531) Pulse Rate:  [97-109] 103 (12/30 0531) Resp:  [14-24] 18 (12/30 0531) BP: (111-134)/(71-78) 134/78 (12/30 0531) SpO2:  [98 %-99 %] 99 % (12/30 0531) Last BM Date: 12/18/21  Intake/Output from previous day: 12/29 0701 - 12/30 0700 In: 1358.2 [P.O.:460; I.V.:898.2] Out: 1500 [Urine:1250; Stool:250] Intake/Output this shift: No intake/output data recorded.  General appearance: alert and cooperative Resp: clear to auscultation bilaterally Cardio: regular rate and rhythm GI: soft, nontender. Ostomy pink and productive  Lab Results:  Recent Labs    12/17/21 1409  WBC 13.8*  HGB 9.6*  HCT 30.6*  PLT 411*   BMET Recent Labs    12/19/21 0410  NA 140  K 3.7  CL 108  CO2 25  GLUCOSE 155*  BUN 24*  CREATININE 0.46  CALCIUM 10.5*   PT/INR No results for input(s): LABPROT, INR in the last 72 hours. ABG No results for input(s): PHART, HCO3 in the last 72 hours.  Invalid input(s): PCO2, PO2  Studies/Results: No results found.  Anti-infectives: Anti-infectives (From admission, onward)    Start     Dose/Rate Route Frequency Ordered Stop   11/25/21 1000  piperacillin-tazobactam (ZOSYN) IVPB 3.375 g  Status:  Discontinued        3.375 g 12.5 mL/hr over 240 Minutes Intravenous Every 8 hours 11/25/21 0909 12/01/21 0822   11/17/21 1830  piperacillin-tazobactam (ZOSYN) IVPB 3.375 g        3.375 g 12.5 mL/hr over 240 Minutes Intravenous Every 8 hours 11/17/21 1415 11/22/21 2359   11/17/21 1230  piperacillin-tazobactam (ZOSYN) IVPB 3.375 g        3.375 g 100 mL/hr over 30 Minutes Intravenous  Once 11/17/21 1220 11/17/21 1304       Assessment/Plan: s/p Procedure(s): EXPLORATORY LAPAROTOMY, EXTENDED RIGHT COLECTOMY, END ILEOSTOMY, MUCOUS FISTULA (N/A) PARTIAL COLECTOMY (N/A) Advance  diet POD 33, 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 Harlow Asa 11/27 - path: necrotic colon at site of perforation, no malignancy or dysplasia noted  - CT 12/4 w/ trace peripancreatic and right paracolic gutter simple free fluid that extends inferiorly into the right pelvis with associated peritoneal wall enhancement/thickening. No organized drainable fluid collection.  - Restarted abx 12/5. WBC normalized. Abx stopped. - Minimal oral intake. Was on cortrak + TF 12/7-8. Cortrak became clogged and patient refused replacement 12/8. - Ileostomy output improved. Off Imodium. Cont BID metamucil - WOC following for ileostomy, mucous fistula; CT 12/28 with complex collection at incision, probed some today and got return of purulent drainage - some odor and green-blue hue, will order Dakin's x72 hrs. Continue TID dressing changes  - Mobilize. PT/OT recommend SNF, TOC consulted - Pathology benign. CT distal descending/proximal sigmoid bowel luminal collapse -- radiology recommending colonoscopy as outpatient.    FEN - Regular diet as tolerated, ensure TID, TPN. Cycle TPN at night to try to stimulate appetite and continue Mirtazapine, pt only taking in 10% of calories orally,  ?PEG placement  but patient not agreeable at this time. Psych evaluated 12/27 and determined that patient does not have capacity to make medical decisions at this time. Will discuss with patient's brother today. Considering palliative consult to help with establishing [place].    VTE - SCDs, Lovenox ID - zosyn  11/27 - 12/2. 12/5-12/11 Foley - purewick, would prefer patient be getting up at least to [facility]   Encephalopathy/FTT - Patient has been on folic acid and thiamine supplements. Noted to have poor motor control and coordination the other day by PT which is different from last week per therapy notes, Neurology consulted - appreciated their input. No further  workup needed at this time from their standpoint.   - psych saw 12/27 and determined patient does not have capacity. Patient has been worsening since admission.  She was confused from POD 1 on. Question if she was like this prior to admission, but lived alone and no one aware of these issues.  LOS: 33 days    Autumn Messing III 12/20/2021

## 2021-12-20 NOTE — Progress Notes (Signed)
PHARMACY - TOTAL PARENTERAL NUTRITION CONSULT NOTE   Indication:  Inability to meet enteral needs  Patient Measurements: Height: '5\' 10"'  (177.8 cm) Weight: 76.5 kg (168 lb 10.4 oz) IBW/kg (Calculated) : 68.5 TPN AdjBW (KG): 79.8 Body mass index is 24.2 kg/m. Usual Weight: 80 kg  Assessment: 65 y.o. 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 She is now extended right colectomy and ileostomy. She developed acute metabolic encephalopathy post-op. She was initiated on TF, but tube became clogged on 12/8 and patient refused replacement, and is not eating enough to maintain nutrition.  Calorie count has been ordered.  CCS ordered TPN to start on 12/12, pharmacy to manage.  Glucose / Insulin: No hx DM. CBGs controlled and SSI d/c'd on 12/24. Electrolytes: labs from 12/29: CorrCa 12.02 (no Ca on TPN); phos 2.6 - other lytes wnl Renal: labs from 12/29: SCr < 1 stable, BUN 24 Hepatic: labs from 12/29: LFTs, Alk Phos, T.bili all WNL. Albumin low Trig: WNL (12/26) Intake / Output: - 141.8 - UOP 122m, stool output: 250 ml MIVF: none GI Imaging:  - 12/27 abd CT: Postsurgical changes with a right lower quadrant ileostomy and left colostomy. No bowel obstruction. 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 Interval decrease in the size of the fluid density inferior to the gallbladder. No drainable fluid collection Identified. Complex collection in the deep subcutaneous soft tissues of theanterior abdominal wall along the midline extending to the skin at the level of the umbilicus GI Surgeries / Procedures:  11/27: Expiratory laparotomy, extended right colectomy, end ileostomy  Central access: PICC placed 12/11 TPN start date:  12/12  Nutritional Goals:  RD Assessment: Estimated Needs Total Energy Estimated Needs: 1950-2150 Total Protein Estimated Needs: 95-105g Total Fluid Estimated Needs: 2L/day  Current Nutrition:  Regular diet- Minimal intake  Ensure Enlive TID (20 g of protein and 350 kcal) *Intake not consistent Juven supplement packet (2.5g of protein and 95 kcal) *Intake not consistent Half rate cyclic TPN - 18,315mL provides 54g protein and 1094 kcal per day. Attempting to stimulate appetite during the day.    Plan:  At 117:61  Continue cyclic TPN giving ~~60%of nutritional needs. Infuse 1,000 mL over 14 hrs: 38 mL/hr x 1 hr, then 77 mL/hr x 12 hrs, then 38 mL/hr x 1 hr. Electrolytes in TPN. No change Na 555m/L K 5032mL Ca 0mE65m Mg 5mEq59mPhos 15mmo17mCl:Ac 1:2 Continue multivitamin PO Thiamine per neurology Monitor TPN labs on Mon/Thurs Follow up calorie counts and/or PEG tube discussions  Astryd Pearcy Ulice DashmD  12/20/2021 8:16 AM

## 2021-12-20 NOTE — Progress Notes (Signed)
OT Cancellation Note  Patient Details Name: Sherry Hampton MRN: [id number] DOB: 24-May-1956   Cancelled Treatment:    Reason Eval/Treat Not Completed: Medical issues which prohibited therapy. Patient getting wound care when therapist entered the room. RN reports patient with fever and more lethargic today. Will hold and follow up as able.  Lexine Jaspers L Marjory Meints 12/20/2021, 3:14 PM

## 2021-12-21 DIAGNOSIS — Z7189 Other specified counseling: Secondary | ICD-10-CM | POA: Diagnosis not present

## 2021-12-21 DIAGNOSIS — R410 Disorientation, unspecified: Secondary | ICD-10-CM | POA: Diagnosis not present

## 2021-12-21 DIAGNOSIS — K219 Gastro-esophageal reflux disease without esophagitis: Secondary | ICD-10-CM | POA: Diagnosis not present

## 2021-12-21 DIAGNOSIS — R198 Other specified symptoms and signs involving the digestive system and abdomen: Secondary | ICD-10-CM | POA: Diagnosis not present

## 2021-12-21 DIAGNOSIS — E43 Unspecified severe protein-calorie malnutrition: Secondary | ICD-10-CM | POA: Diagnosis not present

## 2021-12-21 DIAGNOSIS — K668 Other specified disorders of peritoneum: Secondary | ICD-10-CM | POA: Diagnosis not present

## 2021-12-21 DIAGNOSIS — K55049 Acute infarction of large intestine, extent unspecified: Secondary | ICD-10-CM

## 2021-12-21 DIAGNOSIS — Z515 Encounter for palliative care: Secondary | ICD-10-CM

## 2021-12-21 MED ORDER — TRAVASOL 10 % IV SOLN
INTRAVENOUS | Status: AC
  Filled 2021-12-21: qty 540

## 2021-12-21 NOTE — Progress Notes (Signed)
Pt trying to swallow PO medications with a great deal of diffuculty. Patient vomited up most of her Tylenol and iron pill at 1750 on 12.31.22.

## 2021-12-21 NOTE — Progress Notes (Signed)
PHARMACY - TOTAL PARENTERAL NUTRITION CONSULT NOTE   Indication:  Inability to meet enteral needs  Patient Measurements: Height: '5\' 10"'  (177.8 cm) Weight: 76.5 kg (168 lb 10.4 oz) IBW/kg (Calculated) : 68.5 TPN AdjBW (KG): 79.8 Body mass index is 24.2 kg/m. Usual Weight: 80 kg  Assessment: 65 y.o. 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 She is now extended right colectomy and ileostomy. She developed acute metabolic encephalopathy post-op. She was initiated on TF, but tube became clogged on 12/8 and patient refused replacement, and is not eating enough to maintain nutrition.  Calorie count has been ordered.  CCS ordered TPN to start on 12/12, pharmacy to manage.  Glucose / Insulin: No hx DM. CBGs controlled and SSI d/c'd on 12/24. Electrolytes: labs from 12/29 and 12/30: CorrCa 12.3 (no Ca on TPN); phos 2.6 - other lytes wnl Renal: labs from 12/29: SCr < 1 stable, BUN 24 Hepatic: labs from 12/29: LFTs, Alk Phos, T.bili all WNL. Albumin low Trig: WNL (12/26) Intake / Output: - 141.8 - UOP 1276m, stool output: 525 ml MIVF: none GI Imaging:  - 12/27 abd CT: Postsurgical changes with a right lower quadrant ileostomy and left colostomy. No bowel obstruction. 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 Interval decrease in the size of the fluid density inferior to the gallbladder. No drainable fluid collection Identified. Complex collection in the deep subcutaneous soft tissues of theanterior abdominal wall along the midline extending to the skin at the level of the umbilicus GI Surgeries / Procedures:  11/27: Expiratory laparotomy, extended right colectomy, end ileostomy  Central access: PICC placed 12/11 TPN start date:  12/12  Nutritional Goals:  RD Assessment: Estimated Needs Total Energy Estimated Needs: 1950-2150 Total Protein Estimated Needs: 95-105g Total Fluid Estimated Needs: 2L/day  Current Nutrition:  Regular diet- Minimal intake  Ensure Enlive TID (20 g of protein and 350 kcal) *Intake not consistent Juven supplement packet (2.5g of protein and 95 kcal) *Intake not consistent Half rate cyclic TPN - 17,505mL provides 54g protein and 1094 kcal per day. Attempting to stimulate appetite during the day.    Plan:  At 118:33  Continue cyclic TPN giving ~~58%of nutritional needs. Infuse 1,000 mL over 14 hrs: 38 mL/hr x 1 hr, then 77 mL/hr x 12 hrs, then 38 mL/hr x 1 hr. Electrolytes in TPN. No change Na 554m/L K 5044mL Ca 0mE62m Mg 5mEq70mPhos 15mmo12mCl:Ac 1:2 Continue multivitamin PO Thiamine per neurology Monitor TPN labs on Mon/Thurs Follow up calorie counts and/or PEG tube discussions  Everlina Gotts Ulice DashmD  12/21/2021 10:22 AM

## 2021-12-21 NOTE — Consult Note (Addendum)
Palliative Care Consult Note                                  Date: 12/21/2021   Patient Name: Sherry Hampton  DOB: November 04, 1956  MRN: [id number]  Age / Sex: 65 y.o., female  PCP: Pcp, No Referring Physician: Edison Pace, Md, MD  Reason for Consultation: Establishing goals of care  HPI/Patient Profile: Palliative Care consult requested for goals of care discussion on day 64 in this 65 y.o. female  with past medical history of constipation, GERD, hyperlipidemia, RA (methotrexate). She was admitted from home on 11/17/2021 with abdominal pain and distention. CT of abdomen showed free air with a large pneumoperitoneum, with diverticulum perforation. She is s/p (11/27) exploratory lap, right colectomy, ileostomy, with mucous fistula. Psychiatry evaluated patient on 12/17/2021 and determined she does not have decision capacity. Sherry Hampton has been confused since POD 1.   Past Medical History:  Diagnosis Date   Anemia    past hx    Arthritis    wrists , knees,  feet    Cataract    cordical cataracts per pt    Constipation    takes a stool softener  daily- as long as does this has daily soft stools    GERD (gastroesophageal reflux disease)    Hemorrhoids    Hyperlipidemia      Subjective:   This NP Osborne Oman reviewed medical records, received report from team, assessed the patient and then met at the patient's bedside with patient's brother Milbert Coulter and sister Edd Fabian to discuss diagnosis, prognosis, [place], EOL wishes disposition and options.  Ms. Guarino is awake and alert. She is holding her brother's hand. Calm appearing. Will follow some simple commands. Remains confused.    Concept of Palliative Care was introduced as specialized medical care for people and their families living with serious illness.  It focuses on providing relief from the symptoms and stress of a serious illness.  The goal is to improve quality of life for both the patient and the  family. Values and goals of care important to patient and family were attempted to be elicited. Patient's brother had multiple questions about why were asked to be involved "this late in the game!" Explained ongoing medical management and having [place] involved provided an additional layer of support to the team and to the patient/family to assist in complex medical decision making making sure ongoing treatment is aligned with what Ms. Detter would want for herself. Family verbalized understanding.   I created space and opportunity for patient and family to explore state of health prior to admission, thoughts, and feelings. Milbert Coulter states he lives in [place] and all of patient's siblings are spread out across different states. They all speak multiple times a week to check in on each other.   Prior to admission family reports patient was living alone. Able to provide care independently with no concerns. She was still driving and worked occasionally (more during the tax season). Milbert Coulter states patient's health has always been fairly decent from what they have been told and there were no major concerns.   We discussed Her current illness and what it means in the larger context of Her on-going co-morbidities. Natural disease trajectory and expectations were discussed. Specific discussions regarding patient's ongoing confusion, poor nutrition, and long-term prognosis.   Family verbalized understanding of current illness and co-morbidities. Milbert Coulter states he is the spokes person for his  family and the designated decision maker for Sherry Hampton. He spends time expressing his frustration with his sister's condition pointing out multiple concerns the family has. He alludes to patient being "starved" because her Cortrak was not replaced, her diet not being appropriate which is causing her not to eat as she has several missing teeth and cannot chew, staff not being tentative to her needs and coming in asking if she wants to eat  and if she says no backing off saying she is refusing vs at least trying to see if she will eat with no awareness of what she is responding no to. I provided extensive education regarding patient's condition and how the medical team is doing everything in their power to allow patient to recover with emphasis that she (her body) will have to respond as we can only do so much. Also updated him that although tube feedings were not restarted patient was started on TPN at that time for some nutritional support. I reviewed her breakfast tray which indicates a soft diet. Milbert Coulter states she will eat for him when he feeds her soft foods that she can chew and/or swallow however concerned about what will happen when he returns home next week.   I approached discussions regarding long-term feeding options given patient is not meeting nutritional requirements. Education provided on PEG/artificial feedings including benefits and risk (patient dislodging, refeeding syndrome, infection, or aspiration). Milbert Coulter verbalized understanding expressing family had initially thought this was already done when they first was presented with feeding tube options. I explained the differences. He states family would want PEG tube if this is the only way for Sherry Hampton to receive required nutrition.   Milbert Coulter says he speaks for all of his siblings that they have a strong mistrust in the system and the care she is and have been receiving. He continues to refer to patient walked into the hospital and was doing fine before this illness and now she isn't. Acknowledged his concerns again emphasizing patient's condition is not due to lack of care and her current baseline could not have been prevented much less expected. He verbalizes understanding but also states continued mistrust due to race, lack of finances, and the hospital is "tired of her being here" because we are no longer making money off of her. He states all of his siblings feel the same and Edd Fabian  agrees who is present in the room.   I discussed the importance of continued conversation with family and their medical providers regarding overall plan of care and treatment options, ensuring decisions are within the context of the patients values and GOCs.  Questions and concerns were addressed. The family was encouraged to call with questions or concerns.  PMT will continue to support holistically as needed.  Life Review: Sherry Hampton lived in the home alone prior to admission. She has no children. 6 siblings (4 sisters/2 brothers) who all live in different states. No one lives in [place] except her. 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 Christian faith.    Objective:   Primary Diagnoses: Present on Admission:  Perforated abdominal viscus  GERD (gastroesophageal reflux disease)  Hyperlipidemia  Hyponatremia  Acute blood loss anemia  Acute encephalopathy  Necrosis of colon with perforation    Scheduled Meds:  acetaminophen (TYLENOL) oral liquid 160 mg/5 mL  1,000 mg Oral Q6H   alteplase  2 mg Intracatheter Once   chlorhexidine  15 mL Mouth Rinse  BID   Chlorhexidine Gluconate Cloth  6 each Topical Daily   enoxaparin (LOVENOX) injection  40 mg Subcutaneous Q24H   feeding supplement  237 mL Oral TID BM   ferrous sulfate  325 mg Oral BID WC   folic acid  1 mg Oral Daily   mouth rinse  15 mL Mouth Rinse q12n4p   mirtazapine  15 mg Oral QHS   multivitamin with minerals  1 tablet Oral Daily   nutrition supplement (JUVEN)  1 packet Oral BID BM   pantoprazole  40 mg Oral QHS   psyllium  1 packet Oral BID   sodium chloride flush  10-40 mL Intracatheter Q12H   thiamine  100 mg Oral BID    Continuous Infusions:  sodium chloride 10 mL/hr at 58/85/02 7741   TPN CYCLIC-ADULT (ION)      PRN Meds: sodium chloride, lip balm, loperamide, ondansetron **OR** ondansetron (ZOFRAN) IV, oxyCODONE, sodium chloride flush  No Known Allergies  Review  of Systems  Unable to perform ROS: Mental status change    Physical Exam General: NAD, chronically-ill appearing Cardiovascular: regular rate and rhythm Pulmonary: clear ant fields, diminished bilaterally  Abdomen: soft, mild tenderness, + bowel sounds, ostomy  Extremities: no edema, no joint deformities Skin: no rashes, warm and dry Neurological: confused, will follow some simple commands   Vital Signs:  BP 132/75    Pulse (!) 109    Temp 98.9 F (37.2 C) (Oral)    Resp 14    Ht '5\' 10"'  (1.778 m)    Wt 76.5 kg    SpO2 99%    BMI 24.20 kg/m  Pain Scale: 0-10 POSS *See Group Information*: 1-Acceptable,Awake and alert Pain Score: 0-No pain  SpO2: SpO2: 99 % O2 Device:SpO2: 99 % O2 Flow Rate: .O2 Flow Rate (L/min): 2 L/min  IO: Intake/output summary:  Intake/Output Summary (Last 24 hours) at 12/21/2021 1255 Last data filed at 12/21/2021 0818 Gross per 24 hour  Intake 1388.51 ml  Output 1450 ml  Net -61.49 ml    LBM: Last BM Date: 12/21/21 Baseline Weight: Weight: 79.8 kg Most recent weight: Weight: 76.5 kg      Palliative Assessment/Data:    Advanced Care Planning:   Primary Decision Maker: NEXT OF KIN-Patient deemed to not have capacity by Psych. Per family Milbert Coulter (patient's brother) is primary contact and spokesperson for the family.   Code Status/Advance Care Planning: Full code  A discussion was had today regarding advanced directives. Concepts specific to code status, artifical feeding and hydration, continued IV antibiotics and rehospitalization was had.    I discussed at length patient's full code status with consideration of current illness and prognosis. Milbert Coulter states family wishes for patient to remain a full code with all aggressive interventions.   Patient's brother and sister are clear in expressed goals to treat aggressively. They are remaining hopeful for some improvement. Milbert Coulter speaks to frustration of not being allowed access to the area nursing home list  to reach out personally again expressing family's mistrust in the process. Education provided on the work and role of our [facility]. Family states goal is for patient to discharge to SNF and make further decisions from there. They would like PEG if this is an option.   Brother is asking for access to patient's medical records and Mychart. Advised we could not give him access to MyChart as patient has an active account. Provided process to obtain medical records.   I did not approach discussions regarding hospice  or comfort care as it was clear family is frustrated and want all measures to be taken. I did empathetically approach best case worst case and brother states "we didn't need to be speaking of worst case!"   Assessment & Plan:   SUMMARY OF RECOMMENDATIONS   Full Code-as confirmed by brother Milbert Coulter and sister, Edd Fabian on behalf of all siblings Continue with current plan of care. Family is clear in expressed goals to continue to treat the treatable. They have a strong mistrust in the system and questions patient's condition and care. Provided support and answers as best possible hoping to provide some comfort in knowing the team is putting in all efforts. They are hopeful for SNF placement. In agreement with PEG placement if recommended and able to be performed.  PMT will continue to support and follow as needed. Please call team line with urgent needs. May not see daily given clear set goals.   Symptom Management:  Per Attending  Palliative Prophylaxis:  Aspiration, Bowel Regimen, Delirium Protocol, Frequent Pain Assessment, Oral Care, and Palliative Wound Care  Additional Recommendations (Limitations, Scope, Preferences): Full Scope Treatment  Psycho-social/Spiritual:  Desire for further Chaplaincy support: no Additional Recommendations:  Ongoing discussions and support   Prognosis:  Guarded-Poor   Discharge Planning:  To Be Determined per family SNF rehab is the goal.   Patient's  brother Milbert Coulter and sister, Edd Fabian expressed understanding and was in agreement with this plan.   Time In: 1045 Time Out: 1215 Time Total: 90 min  Visit consisted of counseling and education dealing with the complex and emotionally intense issues of symptom management and palliative care in the setting of serious and potentially life-threatening illness.Greater than 50%  of this time was spent counseling and coordinating care related to the above assessment and plan.  Signed by:  Alda Lea, AGPCNP-BC Palliative Medicine Team  Phone: [phone] Pager: [phone] Amion: Bjorn Pippin   Thank you for allowing the Palliative Medicine Team to assist in the care of this patient. Please utilize secure chat with additional questions, if there is no response within 30 minutes please call the above phone number. Palliative Medicine Team providers are available by phone from 7am to 5pm daily and can be reached through the team cell phone.  Should this patient require assistance outside of these hours, please call the patient's attending physician.

## 2021-12-21 NOTE — Progress Notes (Signed)
34 Days Post-Op   Subjective/Chief Complaint: Restless night but no specific complaints. Fever yesterday   Objective: Vital signs in last 24 hours: Temp:  [97.2 F (36.2 C)-101.8 F (38.8 C)] 97.7 F (36.5 C) (12/31 0529) Pulse Rate:  [93-118] 93 (12/31 0529) Resp:  [14-19] 19 (12/31 0529) BP: (114-133)/(78-91) 126/80 (12/31 0529) SpO2:  [96 %-99 %] 99 % (12/31 0529) Last BM Date: 12/21/21  Intake/Output from previous day: 12/30 0701 - 12/31 0700 In: 1300.5 [P.O.:540; I.V.:760.5] Out: 1775 [Urine:1250; Stool:525] Intake/Output this shift: Total I/O In: 240 [P.O.:240] Out: 0   General appearance: alert and cooperative Resp: clear to auscultation bilaterally Cardio: regular rate and rhythm GI: soft, mild tenderness. Ostomy pink and productive. Wound clean  Lab Results:  Recent Labs    12/20/21 1248  WBC 14.0*  HGB 10.1*  HCT 32.5*  PLT 402*   BMET Recent Labs    12/19/21 0410 12/20/21 1248  NA 140 138  K 3.7 3.7  CL 108 107  CO2 25 26  GLUCOSE 155* 135*  BUN 24* 22  CREATININE 0.46 0.51  CALCIUM 10.5* 10.8*   PT/INR No results for input(s): LABPROT, INR in the last 72 hours. ABG No results for input(s): PHART, HCO3 in the last 72 hours.  Invalid input(s): PCO2, PO2  Studies/Results: DG Chest 1 View  Result Date: 12/20/2021 CLINICAL DATA:  Fever EXAM: CHEST  1 VIEW COMPARISON:  Radiograph 11/27/2021, CT 12/17/2021 FINDINGS: Right upper extremity PICC tip overlies the distal superior vena cava. Unchanged cardiomediastinal silhouette. Unchanged right basilar subsegmental atelectasis. No new airspace disease. No pleural effusion. No pneumothorax. No acute osseous abnormality. IMPRESSION: Unchanged right basilar subsegmental atelectasis. No new airspace disease. Electronically Signed   By: Maurine Simmering M.D.   On: 12/20/2021 16:53   DG Knee 1-2 Views Left  Result Date: 12/20/2021 CLINICAL DATA:  Left ankle and knee pain without injury. EXAM: LEFT KNEE -  1-2 VIEW COMPARISON:  None. FINDINGS: There is no evidence for acute fracture or dislocation. There is no significant joint effusion. There are severe degenerative changes of the lateral compartment of the knee with joint space narrowing, sclerosis, cystic change and osteophyte formation. Joint spaces are otherwise well maintained. IMPRESSION: 1. 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 2. No acute bony abnormality. Electronically Signed   By: Ronney Asters M.D.   On: 12/20/2021 15:19   DG Ankle 2 Views Left  Result Date: 12/20/2021 CLINICAL DATA:  Left ankle and knee pain. EXAM: LEFT ANKLE - 2 VIEW COMPARISON:  None. FINDINGS: There is no evidence of fracture, dislocation, or joint effusion. Soft tissues are within normal limits. No significant osteophyte formation. Joint spaces are well maintained. There is a tiny cortical erosion in the tip of the lateral malleolus which appears chronic. IMPRESSION: 1. No acute bony abnormality. 2. Findings likely related to old injury or degenerative change involving the lateral malleolus. Electronically Signed   By: Ronney Asters M.D.   On: 12/20/2021 15:17   VAS [place] LOWER EXTREMITY VENOUS (DVT)  Result Date: 12/20/2021  Lower Venous DVT Study Patient Name:  Sherry Hampton  Date of Exam:   12/20/2021 Medical Rec #: [id number]       Accession #:    [id number] Date of Birth: 12/19/1956        Patient Gender: F Patient Age:   65 years Exam Location:  [facility] Procedure:      VAS [place] LOWER EXTREMITY VENOUS (DVT) Referring Phys: Claiborne Billings  JOHNSON --------------------------------------------------------------------------------  Indications: Pain.  Risk Factors: None identified. Limitations: Poor ultrasound/tissue interface and patient positioning, patient immobility. Comparison Study: No prior studies. Performing Technologist: Oliver Hum RVT  Examination Guidelines: A complete evaluation includes B-mode imaging, spectral Doppler, color  Doppler, and power Doppler as needed of all accessible portions of each vessel. Bilateral testing is considered an integral part of a complete examination. Limited examinations for reoccurring indications may be performed as noted. The reflux portion of the exam is performed with the patient in reverse Trendelenburg.  +-----+---------------+---------+-----------+----------+--------------+  RIGHT Compressibility Phasicity Spontaneity Properties Thrombus Aging  +-----+---------------+---------+-----------+----------+--------------+  CFV   Full            Yes       Yes                                    +-----+---------------+---------+-----------+----------+--------------+   +---------+---------------+---------+-----------+----------+--------------+  LEFT      Compressibility Phasicity Spontaneity Properties Thrombus Aging  +---------+---------------+---------+-----------+----------+--------------+  CFV       Full            Yes       Yes                                    +---------+---------------+---------+-----------+----------+--------------+  SFJ       Full                                                             +---------+---------------+---------+-----------+----------+--------------+  FV Prox   Full                                                             +---------+---------------+---------+-----------+----------+--------------+  FV Mid    Full                                                             +---------+---------------+---------+-----------+----------+--------------+  FV Distal Full                                                             +---------+---------------+---------+-----------+----------+--------------+  PFV       Full                                                             +---------+---------------+---------+-----------+----------+--------------+  POP       Full  Yes       Yes                                     +---------+---------------+---------+-----------+----------+--------------+  PTV       Full                                                             +---------+---------------+---------+-----------+----------+--------------+  PERO      Full                                                             +---------+---------------+---------+-----------+----------+--------------+    Summary: RIGHT: - No evidence of common femoral vein obstruction.  LEFT: - There is no evidence of deep vein thrombosis in the lower extremity.  - No cystic structure found in the popliteal fossa.  *See table(s) above for measurements and observations. Electronically signed by Monica Martinez MD on 12/20/2021 at 4:24:20 PM.    Final     Anti-infectives: Anti-infectives (From admission, onward)    Start     Dose/Rate Route Frequency Ordered Stop   11/25/21 1000  piperacillin-tazobactam (ZOSYN) IVPB 3.375 g  Status:  Discontinued        3.375 g 12.5 mL/hr over 240 Minutes Intravenous Every 8 hours 11/25/21 0909 12/01/21 0822   11/17/21 1830  piperacillin-tazobactam (ZOSYN) IVPB 3.375 g        3.375 g 12.5 mL/hr over 240 Minutes Intravenous Every 8 hours 11/17/21 1415 11/22/21 2359   11/17/21 1230  piperacillin-tazobactam (ZOSYN) IVPB 3.375 g        3.375 g 100 mL/hr over 30 Minutes Intravenous  Once 11/17/21 1220 11/17/21 1304       Assessment/Plan: s/p Procedure(s): EXPLORATORY LAPAROTOMY, EXTENDED RIGHT COLECTOMY, END ILEOSTOMY, MUCOUS FISTULA (N/A) PARTIAL COLECTOMY (N/A) Advance diet. Encourage po's Fever. Cxr and urine looked ok. Recent CT improved. 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 Harlow Asa 11/27 - path: necrotic colon at site of perforation, no malignancy or dysplasia noted  - CT 12/4 w/ trace peripancreatic and right  paracolic gutter simple free fluid that extends inferiorly into the right pelvis with associated peritoneal wall enhancement/thickening. No organized drainable fluid collection.  - Restarted abx 12/5. WBC normalized. Abx stopped. - Minimal oral intake. Was on cortrak + TF 12/7-8. Cortrak became clogged and patient refused replacement 12/8. - Ileostomy output improved. Off Imodium. Cont BID metamucil - WOC following for ileostomy, mucous fistula; CT 12/28 with complex collection at incision, probed some today and got return of purulent drainage - some odor and green-blue hue, will order Dakin's x72 hrs. Continue TID dressing changes  - Mobilize. PT/OT recommend SNF, TOC consulted - Pathology benign. CT distal descending/proximal sigmoid bowel luminal collapse -- radiology recommending colonoscopy as outpatient.    FEN - Regular diet as tolerated, ensure TID, TPN. Cycle TPN at night to try  to stimulate appetite and continue Mirtazapine, pt only taking in 10% of calories orally,  ?PEG placement  but patient not agreeable at this time. Psych evaluated 12/27 and determined that patient does not have capacity to make medical decisions at this time. Will discuss with patient's brother today. Considering palliative consult to help with establishing [place].    VTE - SCDs, Lovenox ID - zosyn 11/27 - 12/2. 12/5-12/11 Foley - purewick, would prefer patient be getting up at least to [facility]   Encephalopathy/FTT - Patient has been on folic acid and thiamine supplements. Noted to have poor motor control and coordination the other day by PT which is different from last week per therapy notes, Neurology consulted - appreciated their input. No further workup needed at this time from their standpoint.   - psych saw 12/27 and determined patient does not have capacity. Patient has been worsening since admission.  She was confused from POD 1 on. Question if she was like this prior to admission, but lived alone and no one aware of  these issues.  LOS: 34 days    Sherry Hampton 12/21/2021

## 2021-12-22 ENCOUNTER — Inpatient Hospital Stay (HOSPITAL_COMMUNITY): Payer: Medicare HMO

## 2021-12-22 LAB — COMPREHENSIVE METABOLIC PANEL
ALT: 12 U/L (ref 0–44)
AST: 19 U/L (ref 15–41)
Albumin: 2.2 g/dL — ABNORMAL LOW (ref 3.5–5.0)
Alkaline Phosphatase: 77 U/L (ref 38–126)
Anion gap: 3 — ABNORMAL LOW (ref 5–15)
BUN: 27 mg/dL — ABNORMAL HIGH (ref 8–23)
CO2: 24 mmol/L (ref 22–32)
Calcium: 10.2 mg/dL (ref 8.9–10.3)
Chloride: 113 mmol/L — ABNORMAL HIGH (ref 98–111)
Creatinine, Ser: 0.47 mg/dL (ref 0.44–1.00)
GFR, Estimated: >60 mL/min (ref >60)
Glucose, Bld: 186 mg/dL — ABNORMAL HIGH (ref 70–99)
Potassium: 3.6 mmol/L (ref 3.5–5.1)
Sodium: 140 mmol/L (ref 135–145)
Total Bilirubin: 0.2 mg/dL — ABNORMAL LOW (ref 0.3–1.2)
Total Protein: 8.2 g/dL — ABNORMAL HIGH (ref 6.5–8.1)

## 2021-12-22 LAB — GLUCOSE, CAPILLARY
Glucose-Capillary: 98 mg/dL (ref 70–99)
Glucose-Capillary: 118 mg/dL — ABNORMAL HIGH (ref 70–99)

## 2021-12-22 LAB — URINE CULTURE

## 2021-12-22 LAB — CBC
HCT: 30.9 % — ABNORMAL LOW (ref 36.0–46.0)
Hemoglobin: 9.6 g/dL — ABNORMAL LOW (ref 12.0–15.0)
MCH: 28.1 pg (ref 26.0–34.0)
MCHC: 31.1 g/dL (ref 30.0–36.0)
MCV: 90.4 fL (ref 80.0–100.0)
Platelets: 386 10*3/uL (ref 150–400)
RBC: 3.42 MIL/uL — ABNORMAL LOW (ref 3.87–5.11)
RDW: 15.8 % — ABNORMAL HIGH (ref 11.5–15.5)
WBC: 14.4 10*3/uL — ABNORMAL HIGH (ref 4.0–10.5)
nRBC: 0 % (ref 0.0–0.2)

## 2021-12-22 MED ORDER — IOHEXOL 9 MG/ML PO SOLN
500.0000 mL | ORAL | Status: AC
  Administered 2021-12-22 (×2): 500 mL via ORAL

## 2021-12-22 MED ORDER — SODIUM CHLORIDE (PF) 0.9 % IJ SOLN
INTRAMUSCULAR | Status: AC
  Filled 2021-12-22: qty 50

## 2021-12-22 MED ORDER — TRAVASOL 10 % IV SOLN
INTRAVENOUS | Status: AC
  Filled 2021-12-22: qty 540

## 2021-12-22 MED ORDER — INSULIN ASPART 100 UNIT/ML IJ SOLN
0.0000 [IU] | INTRAMUSCULAR | Status: DC
  Administered 2021-12-23: 1 [IU] via SUBCUTANEOUS
  Administered 2021-12-24: 2 [IU] via SUBCUTANEOUS
  Administered 2021-12-25 (×2): 1 [IU] via SUBCUTANEOUS
  Administered 2021-12-27: 2 [IU] via SUBCUTANEOUS

## 2021-12-22 MED ORDER — IOHEXOL 350 MG/ML SOLN
80.0000 mL | Freq: Once | INTRAVENOUS | Status: AC | PRN
  Administered 2021-12-22: 80 mL via INTRAVENOUS

## 2021-12-22 NOTE — Progress Notes (Signed)
Patient able to take all medications this evening in orange sherbet, sat up close to 90 degrees and swallowed without any difficulty or coughing, offered ensure but patient adamantly declined saying "I am a strong willed woman", patient not oriented to place, time, or situation, and also told this Probation officer that she did not know her name, when asked what she liked to be called she said " I don't like answering questions", patient has denied any pain and does not appear to be in any discomfort this evening, support given to patient along with her sister in the room who stated " I will honor my sister's wishes, if she says she is ready to go then I support her", will continue to monitor and care for patient and provide any necessary support for her and also her family.

## 2021-12-22 NOTE — Progress Notes (Signed)
PHARMACY - TOTAL PARENTERAL NUTRITION CONSULT NOTE   Indication:  Inability to meet enteral needs  Patient Measurements: Height: 5\' 10"  (177.8 cm) Weight: 76.5 kg (168 lb 10.4 oz) IBW/kg (Calculated) : 68.5 TPN AdjBW (KG): 79.8 Body mass index is 24.2 kg/m. Usual Weight: 80 kg  Assessment: 66 y.o. 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 She is now extended right colectomy and ileostomy. She developed acute metabolic encephalopathy post-op. She was initiated on TF, but tube became clogged on 12/8 and patient refused replacement, and is not eating enough to maintain nutrition.  Calorie count has been ordered.  CCS ordered TPN to start on 12/12, pharmacy to manage.  Glucose / Insulin: No hx DM. CBGs controlled and SSI d/c'd on 12/24. 1/1: glucose 186 on chemistry Electrolytes: WNL except Cl 113, CorrCa: 11.6 (no CA in TPN) Renal: SCr 0.47 stable, BUN up to 27 Hepatic: LFTs WNL, Albumin low Trig: WNL (12/26) Intake / Output: - 141.8 - UOP 900 mL, stool output: 180 ml MIVF: none GI Imaging:  - 12/27 abd CT: Postsurgical changes with a right lower quadrant ileostomy and left colostomy. No bowel obstruction. 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 Interval decrease in the size of the fluid density inferior to the gallbladder. No drainable fluid collection Identified. Complex collection in the deep subcutaneous soft tissues of theanterior abdominal wall along the midline extending to the skin at the level of the umbilicus GI Surgeries / Procedures:  11/27: Expiratory laparotomy, extended right colectomy, end ileostomy  Central access: PICC placed 12/11 TPN start date: 12/12  Nutritional Goals:  RD Assessment: Estimated  Needs Total Energy Estimated Needs: 1950-2150 Total Protein Estimated Needs: 95-105g Total Fluid Estimated Needs: 2L/day  Current Nutrition:  Regular diet- Minimal intake  Ensure Enlive TID (20 g of protein and 350 kcal) *Intake not consistent Juven supplement packet (2.5g of protein and 95 kcal) *Intake not consistent Half rate cyclic TPN - 3,664 mL provides 54g protein and 1094 kcal per day. Attempting to stimulate appetite during the day.    Plan:  At 40:34:  Continue cyclic TPN giving ~74% of nutritional needs. Infuse 1,000 mL over 14 hrs: 38 mL/hr x 1 hr, then 77 mL/hr x 12 hrs, then 38 mL/hr x 1 hr. Electrolytes in TPN. No change Na 21mEq/L K 53mEq/L Ca 14mEq/L Mg 48mEq/L Phos 42mmol/L Cl:Ac max Ac Resume SSI sensitiveTID and adjust as needed Continue multivitamin PO Thiamine per neurology Monitor TPN labs on Mon/Thurs Follow up calorie counts and/or PEG tube discussions  Ulice Dash, PharmD  12/22/2021 9:29 AM

## 2021-12-22 NOTE — Plan of Care (Signed)
  Problem: Coping: Goal: Level of anxiety will decrease Outcome: Progressing   Problem: Pain Managment: Goal: General experience of comfort will improve Outcome: Progressing   Problem: Safety: Goal: Ability to remain free from injury will improve Outcome: Progressing   

## 2021-12-22 NOTE — Progress Notes (Signed)
35 Days Post-Op   Subjective/Chief Complaint: No complaints. Did not eat yesterday   Objective: Vital signs in last 24 hours: Temp:  [97.6 F (36.4 C)-98.9 F (37.2 C)] 97.6 F (36.4 C) (01/01 0530) Pulse Rate:  [97-109] 99 (01/01 0530) Resp:  [14-15] 15 (01/01 0530) BP: (114-132)/(73-80) 114/73 (01/01 0530) SpO2:  [97 %-99 %] 97 % (01/01 0530) Last BM Date: 12/22/21  Intake/Output from previous day: 12/31 0701 - 01/01 0700 In: 1371.8 [P.O.:480; I.V.:891.8] Out: 1080 [Urine:900; Stool:180] Intake/Output this shift: No intake/output data recorded.  General appearance: alert and refused to let me examine her abd  Lab Results:  Recent Labs    12/20/21 1248 12/22/21 0825  WBC 14.0* 14.4*  HGB 10.1* 9.6*  HCT 32.5* 30.9*  PLT 402* 386   BMET Recent Labs    12/20/21 1248 12/22/21 0825  NA 138 140  K 3.7 3.6  CL 107 113*  CO2 26 24  GLUCOSE 135* 186*  BUN 22 27*  CREATININE 0.51 0.47  CALCIUM 10.8* 10.2   PT/INR No results for input(s): LABPROT, INR in the last 72 hours. ABG No results for input(s): PHART, HCO3 in the last 72 hours.  Invalid input(s): PCO2, PO2  Studies/Results: DG Chest 1 View  Result Date: 12/20/2021 CLINICAL DATA:  Fever EXAM: CHEST  1 VIEW COMPARISON:  Radiograph 11/27/2021, CT 12/17/2021 FINDINGS: Right upper extremity PICC tip overlies the distal superior vena cava. Unchanged cardiomediastinal silhouette. Unchanged right basilar subsegmental atelectasis. No new airspace disease. No pleural effusion. No pneumothorax. No acute osseous abnormality. IMPRESSION: Unchanged right basilar subsegmental atelectasis. No new airspace disease. Electronically Signed   By: Maurine Simmering M.D.   On: 12/20/2021 16:53   DG Knee 1-2 Views Left  Result Date: 12/20/2021 CLINICAL DATA:  Left ankle and knee pain without injury. EXAM: LEFT KNEE - 1-2 VIEW COMPARISON:  None. FINDINGS: There is no evidence for acute fracture or dislocation. There is no  significant joint effusion. There are severe degenerative changes of the lateral compartment of the knee with joint space narrowing, sclerosis, cystic change and osteophyte formation. Joint spaces are otherwise well maintained. IMPRESSION: 1. 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 2. No acute bony abnormality. Electronically Signed   By: Ronney Asters M.D.   On: 12/20/2021 15:19   DG Ankle 2 Views Left  Result Date: 12/20/2021 CLINICAL DATA:  Left ankle and knee pain. EXAM: LEFT ANKLE - 2 VIEW COMPARISON:  None. FINDINGS: There is no evidence of fracture, dislocation, or joint effusion. Soft tissues are within normal limits. No significant osteophyte formation. Joint spaces are well maintained. There is a tiny cortical erosion in the tip of the lateral malleolus which appears chronic. IMPRESSION: 1. No acute bony abnormality. 2. Findings likely related to old injury or degenerative change involving the lateral malleolus. Electronically Signed   By: Ronney Asters M.D.   On: 12/20/2021 15:17   VAS [place] LOWER EXTREMITY VENOUS (DVT)  Result Date: 12/20/2021  Lower Venous DVT Study Patient Name:  Sherry Hampton  Date of Exam:   12/20/2021 Medical Rec #: [id number]       Accession #:    [id number] Date of Birth: [birth year]        Patient Gender: F Patient Age:   66 years Exam Location:  [facility] Procedure:      VAS [place] LOWER EXTREMITY VENOUS (DVT) Referring Phys: Barkley Boards --------------------------------------------------------------------------------  Indications: Pain.  Risk Factors: None identified. Limitations: Poor ultrasound/tissue interface  and patient positioning, patient immobility. Comparison Study: No prior studies. Performing Technologist: Oliver Hum RVT  Examination Guidelines: A complete evaluation includes B-mode imaging, spectral Doppler, color Doppler, and power Doppler as needed of all accessible portions of each vessel. Bilateral testing is considered  an integral part of a complete examination. Limited examinations for reoccurring indications may be performed as noted. The reflux portion of the exam is performed with the patient in reverse Trendelenburg.  +-----+---------------+---------+-----------+----------+--------------+  RIGHT Compressibility Phasicity Spontaneity Properties Thrombus Aging  +-----+---------------+---------+-----------+----------+--------------+  CFV   Full            Yes       Yes                                    +-----+---------------+---------+-----------+----------+--------------+   +---------+---------------+---------+-----------+----------+--------------+  LEFT      Compressibility Phasicity Spontaneity Properties Thrombus Aging  +---------+---------------+---------+-----------+----------+--------------+  CFV       Full            Yes       Yes                                    +---------+---------------+---------+-----------+----------+--------------+  SFJ       Full                                                             +---------+---------------+---------+-----------+----------+--------------+  FV Prox   Full                                                             +---------+---------------+---------+-----------+----------+--------------+  FV Mid    Full                                                             +---------+---------------+---------+-----------+----------+--------------+  FV Distal Full                                                             +---------+---------------+---------+-----------+----------+--------------+  PFV       Full                                                             +---------+---------------+---------+-----------+----------+--------------+  POP       Full            Yes       Yes                                    +---------+---------------+---------+-----------+----------+--------------+  PTV       Full                                                              +---------+---------------+---------+-----------+----------+--------------+  PERO      Full                                                             +---------+---------------+---------+-----------+----------+--------------+    Summary: RIGHT: - No evidence of common femoral vein obstruction.  LEFT: - There is no evidence of deep vein thrombosis in the lower extremity.  - No cystic structure found in the popliteal fossa.  *See table(s) above for measurements and observations. Electronically signed by Monica Martinez MD on 12/20/2021 at 4:24:20 PM.    Final     Anti-infectives: Anti-infectives (From admission, onward)    Start     Dose/Rate Route Frequency Ordered Stop   11/25/21 1000  piperacillin-tazobactam (ZOSYN) IVPB 3.375 g  Status:  Discontinued        3.375 g 12.5 mL/hr over 240 Minutes Intravenous Every 8 hours 11/25/21 0909 12/01/21 0822   11/17/21 1830  piperacillin-tazobactam (ZOSYN) IVPB 3.375 g        3.375 g 12.5 mL/hr over 240 Minutes Intravenous Every 8 hours 11/17/21 1415 11/22/21 2359   11/17/21 1230  piperacillin-tazobactam (ZOSYN) IVPB 3.375 g        3.375 g 100 mL/hr over 30 Minutes Intravenous  Once 11/17/21 1220 11/17/21 1304       Assessment/Plan: s/p Procedure(s): EXPLORATORY LAPAROTOMY, EXTENDED RIGHT COLECTOMY, END ILEOSTOMY, MUCOUS FISTULA (N/A) PARTIAL COLECTOMY (N/A) Advance diet Wbc elevated. 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 Harlow Asa 11/27 - path: necrotic colon at site of perforation, no malignancy or dysplasia noted  - CT 12/4 w/ trace peripancreatic and right paracolic gutter simple free fluid that extends inferiorly into the right pelvis with associated peritoneal wall enhancement/thickening. No organized drainable fluid collection.  - Restarted abx 12/5. WBC normalized. Abx stopped. - Minimal oral intake. Was  on cortrak + TF 12/7-8. Cortrak became clogged and patient refused replacement 12/8. - Ileostomy output improved. Off Imodium. Cont BID metamucil - WOC following for ileostomy, mucous fistula; CT 12/28 with complex collection at incision, probed some today and got return of purulent drainage - some odor and green-blue hue, will order Dakin's x72 hrs. Continue TID dressing changes  - Mobilize. PT/OT recommend SNF, TOC consulted - Pathology benign. CT distal descending/proximal sigmoid bowel luminal collapse -- radiology recommending colonoscopy as outpatient.    FEN - Regular diet as tolerated, ensure TID, TPN. Cycle TPN at night to try to stimulate appetite and continue Mirtazapine, pt only taking in 10% of calories orally,  ?PEG placement  but patient not agreeable at this time. Psych evaluated 12/27 and determined that patient does not have capacity to make medical decisions at this time. Will discuss with patient's brother today. Considering palliative consult to help with establishing [place].    VTE -  SCDs, Lovenox ID - zosyn 11/27 - 12/2. 12/5-12/11 Foley - purewick, would prefer patient be getting up at least to [facility]   Encephalopathy/FTT - Patient has been on folic acid and thiamine supplements. Noted to have poor motor control and coordination the other day by PT which is different from last week per therapy notes, Neurology consulted - appreciated their input. No further workup needed at this time from their standpoint.   - psych saw 12/27 and determined patient does not have capacity. Patient has been worsening since admission.  She was confused from POD 1 on. Question if she was like this prior to admission, but lived alone and no one aware of these issues.  LOS: 35 days    Sherry Hampton III 12/22/2021

## 2021-12-23 LAB — COMPREHENSIVE METABOLIC PANEL
ALT: 14 U/L (ref 0–44)
AST: 17 U/L (ref 15–41)
Albumin: 2 g/dL — ABNORMAL LOW (ref 3.5–5.0)
Alkaline Phosphatase: 81 U/L (ref 38–126)
Anion gap: 6 (ref 5–15)
BUN: 22 mg/dL (ref 8–23)
CO2: 26 mmol/L (ref 22–32)
Calcium: 10 mg/dL (ref 8.9–10.3)
Chloride: 105 mmol/L (ref 98–111)
Creatinine, Ser: 0.42 mg/dL — ABNORMAL LOW (ref 0.44–1.00)
GFR, Estimated: >60 mL/min (ref >60)
Glucose, Bld: 145 mg/dL — ABNORMAL HIGH (ref 70–99)
Potassium: 3.6 mmol/L (ref 3.5–5.1)
Sodium: 137 mmol/L (ref 135–145)
Total Bilirubin: 0.3 mg/dL (ref 0.3–1.2)
Total Protein: 7.6 g/dL (ref 6.5–8.1)

## 2021-12-23 LAB — PHOSPHORUS: Phosphorus: 2.6 mg/dL (ref 2.5–4.6)

## 2021-12-23 LAB — GLUCOSE, CAPILLARY
Glucose-Capillary: 113 mg/dL — ABNORMAL HIGH (ref 70–99)
Glucose-Capillary: 150 mg/dL — ABNORMAL HIGH (ref 70–99)

## 2021-12-23 LAB — TRIGLYCERIDES: Triglycerides: 60 mg/dL (ref <150)

## 2021-12-23 LAB — MAGNESIUM: Magnesium: 2 mg/dL (ref 1.7–2.4)

## 2021-12-23 MED ORDER — TRAVASOL 10 % IV SOLN
INTRAVENOUS | Status: AC
  Filled 2021-12-23: qty 540

## 2021-12-23 MED ORDER — TRAVASOL 10 % IV SOLN
INTRAVENOUS | Status: DC
  Filled 2021-12-23: qty 540

## 2021-12-23 NOTE — Progress Notes (Signed)
PHARMACY - TOTAL PARENTERAL NUTRITION CONSULT NOTE   Indication:  Inability to meet enteral needs  Patient Measurements: Height: 5\' 10"  (177.8 cm) Weight: 76.5 kg (168 lb 10.4 oz) IBW/kg (Calculated) : 68.5 TPN AdjBW (KG): 79.8 Body mass index is 24.2 kg/m. Usual Weight: 80 kg  Assessment: 66 y.o. 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 She is now extended right colectomy and ileostomy. She developed acute metabolic encephalopathy post-op. She was initiated on TF, but tube became clogged on 12/8 and patient refused replacement, and is not eating enough to maintain nutrition.  Calorie count has been ordered.  CCS ordered TPN to start on 12/12, pharmacy to manage.  Glucose / Insulin: No hx DM. CBGs controlled and SSI d/c'd on 12/24. SSI resumed 1/1 no insulin used  Electrolytes: WNL, CorrCa: 11.6 (no Ca in TPN) Renal: SCr <1 stable, BUN  WNL Hepatic: LFTs WNL, Albumin low Trig: WNL (12/26, 1/2) Intake / Output: - 663 - UOP 1550 mL, stool output: 650 ml MIVF: none GI Imaging:  - 12/27 abd CT: Postsurgical changes with a right lower quadrant ileostomy and left colostomy. No bowel obstruction. 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 Interval decrease in the size of the fluid density inferior to the gallbladder. No drainable fluid collection Identified. Complex collection in the deep subcutaneous soft tissues of theanterior abdominal wall along the midline extending to the skin at the level of the umbilicus GI Surgeries / Procedures:  11/27: Expiratory laparotomy, extended right colectomy, end ileostomy  Central access: PICC placed 12/11 TPN start date: 12/12  Nutritional Goals:  RD Assessment: Estimated Needs Total  Energy Estimated Needs: 1950-2150 Total Protein Estimated Needs: 95-105g Total Fluid Estimated Needs: 2L/day  Current Nutrition:  Regular diet- Minimal intake  Ensure Enlive TID (20 g of protein and 350 kcal) *Intake not consistent Juven supplement packet (2.5g of protein and 95 kcal) *Intake not consistent Half rate cyclic TPN - 8,338 mL provides 54g protein and 1094 kcal per day. Attempting to stimulate appetite during the day.    Plan:  At 25:05:  Continue cyclic TPN giving ~39% of nutritional needs. Infuse 1,000 mL over 14 hrs: 38 mL/hr x 1 hr, then 77 mL/hr x 12 hrs, then 38 mL/hr x 1 hr. Electrolytes in TPN. No change Na 34mEq/L K 71mEq/L Ca 103mEq/L Mg 73mEq/L Phos 62mmol/L Cl:Ac max Ac SSI sensitiveTID and adjust as needed Continue multivitamin PO Thiamine per neurology Monitor TPN labs on Mon/Thurs BMP, magnesium, phosphorus with AM labs  Follow up calorie counts and/or PEG tube discussions   Royetta Asal, PharmD, BCPS 12/23/2021 10:32 AM

## 2021-12-23 NOTE — Progress Notes (Signed)
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 Harlow Asa 11/27.  Per recent MD notes: "Encephalopathy/FTT - Patient has been on folic acid and thiamine supplements. Noted to have poor motor control and coordination the other day by PT which is different from last week per therapy notes, Neurology consulted - appreciated their input. No further workup needed at this time from their standpoint.    - psych saw 12/27 and determined patient does not have capacity. Patient has been worsening since admission.  She was confused from POD 1 on. Question if she was like this prior to admission, but lived alone and no one aware of these issues."    PT Comments    Continues to make limited progress toward goals. Per palliative care pt is full scope of tx, SNF post acute    Recommendations for follow up therapy are one component of a multi-disciplinary discharge planning process, led by the attending physician.  Recommendations may be updated based on patient status, additional functional criteria and insurance authorization.  Follow Up Recommendations  Skilled nursing-short term rehab (<3 hours/day)     Assistance Recommended at Discharge Frequent or constant Supervision/Assistance  Equipment Recommendations  None recommended by PT    Recommendations for Other Services       Precautions / Restrictions Precautions Precautions: Fall Precaution Comments: abdominal surgery, RUQ ileostomy Restrictions Weight Bearing Restrictions: No     Mobility  Bed Mobility Overal bed mobility: Needs Assistance Bed Mobility: Rolling;Sidelying to Sit;Sit to Sidelying Rolling: Max assist;+2 for physical assistance Sidelying to sit: Max assist;+2 for physical  assistance;+2 for safety/equipment     Sit to sidelying: Max assist;+2 for physical assistance;+2 for safety/equipment General bed mobility comments: mutlimodal cues for log roll technique, pt requiring assist to initiate and complete bed mobility; extensive assist with trunk and LEs in both directions    Transfers Overall transfer level: Needs assistance Equipment used: Rolling walker (2 wheels) Transfers: Sit to/from Stand Sit to Stand: Max assist;+2 physical assistance;+2 safety/equipment           General transfer comment: pt agreed, attempted however unable d/t pain, weakness fatique. unable to clear hips from bed    Ambulation/Gait                   Stairs             Wheelchair Mobility    Modified Rankin (Stroke Patients Only)       Balance   Sitting-balance support: Feet supported;No upper extremity supported;Single extremity supported Sitting balance-Leahy Scale: Poor Sitting balance - Comments: able to correct to  midline with facilitation of anterior wt shift. posterior bias today with no R lateral leanm Postural control: Posterior lean   Standing balance-Leahy Scale: Zero                              Cognition Arousal/Alertness: Awake/alert Behavior During Therapy: Flat affect Overall Cognitive Status: Impaired/Different from baseline Area of Impairment: Orientation;Attention;Memory;Following commands;Safety/judgement;Awareness;Problem solving                 Orientation Level: Disoriented to;Time;Situation;Place Current Attention Level: Focused Memory: Decreased recall of precautions;Decreased short-term memory Following Commands: Follows one step commands inconsistently Safety/Judgement: Decreased awareness  of safety;Decreased awareness of deficits   Problem Solving: Slow processing;Decreased initiation;Difficulty sequencing;Requires verbal cues General Comments: responds to yes no questions however minimal  accuracy,states "I don't know"        Exercises      General Comments        Pertinent Vitals/Pain Pain Assessment: Faces Faces Pain Scale: Hurts whole lot Pain Location: LLE and R foot with movement Pain Descriptors / Indicators: Guarding;Grimacing Pain Intervention(s): Limited activity within patient's tolerance;Monitored during session;Repositioned    Home Living                          Prior Function            PT Goals (current goals can now be found in the care plan section) Acute Rehab PT Goals PT Goal Formulation: Patient unable to participate in goal setting Time For Goal Achievement: 01/01/22 Potential to Achieve Goals: Fair Progress towards PT goals: Not progressing toward goals - comment    Frequency    Min 2X/week      PT Plan Discharge plan needs to be updated    Co-evaluation              AM-PAC PT "6 Clicks" Mobility   Outcome Measure  Help needed turning from your back to your side while in a flat bed without using bedrails?: Total Help needed moving from lying on your back to sitting on the side of a flat bed without using bedrails?: Total Help needed moving to and from a bed to a chair (including a wheelchair)?: Total Help needed standing up from a chair using your arms (e.g., wheelchair or bedside chair)?: Total Help needed to walk in hospital room?: Total Help needed climbing 3-5 steps with a railing? : Total 6 Click Score: 6    End of Session   Activity Tolerance: Patient limited by fatigue;Other (comment) (cognition) Patient left: in bed;with call bell/phone within reach;with bed alarm set Nurse Communication: Mobility status PT Visit Diagnosis: Muscle weakness (generalized) (M62.81);Other abnormalities of gait and mobility (R26.89)     Time: 9758-8325 PT Time Calculation (min) (ACUTE ONLY): 26 min  Charges:  $Therapeutic Activity: 23-37 mins                     Baxter Flattery, PT  Acute Rehab Dept ([facility])  [phone] Pager [phone]  12/23/2021    [facility] 12/23/2021, 12:13 PM

## 2021-12-23 NOTE — Plan of Care (Signed)
  Problem: Coping: Goal: Level of anxiety will decrease Outcome: Progressing   Problem: Pain Managment: Goal: General experience of comfort will improve Outcome: Progressing   Problem: Safety: Goal: Ability to remain free from injury will improve Outcome: Progressing   

## 2021-12-23 NOTE — Consult Note (Signed)
[facility] Nurse ostomy follow up Patient receiving care in [facility]. No family preset. Stoma type/location: RUQ ileostomy, 1 1/4 inches, round, moist, pink. Mucous fistula (MF) LUQ, with some sloughing. Stomal assessment/size: MF stoma is pink and moist oval just above skin level. RUQ ileostomy is pink budded. Peristomal assessment: ileostomy peristomal skin intact Treatment options for stomal/peristomal skin: barrier ring Output: thin dark green output from ileostomy. Scant output from MF.  Ostomy pouching: 2pc. 2 and 1/4 inch skin barrier and pouch. Education provided: none; patient cannot be taught ostomy self care. Enrolled patient in [facility] Discharge program: Yes, previously.   [place] team will see patient weekly. Bedside nurse to perform pouch changes as needed.  Cathlean Marseilles Tamala Julian, MSN, RN, [place], Lysle Pearl, [facility] Wound Treatment Associate Pager [phone]

## 2021-12-23 NOTE — Progress Notes (Signed)
36 Days Post-Op   Subjective/Chief Complaint: No acute changes. Continues to endorse poor appetite, however 1L PO intake charted for yesterday.   Objective: Vital signs in last 24 hours: Temp:  [97.4 F (36.3 C)-99.8 F (37.7 C)] 97.4 F (36.3 C) (01/02 0540) Pulse Rate:  [91-105] 91 (01/02 0540) Resp:  [16-18] 16 (01/02 0540) BP: (118-132)/(77-81) 132/81 (01/02 0540) SpO2:  [96 %-100 %] 97 % (01/02 0540) Last BM Date: 12/23/21  Intake/Output from previous day: 01/01 0701 - 01/02 0700 In: 1536.1 [P.O.:1220; I.V.:316.1] Out: 2200 [Urine:1550; Stool:650] Intake/Output this shift: Total I/O In: 120 [P.O.:120] Out: 300 [Urine:300]  General: resting comfortably, NAD Neuro: alert and oriented, no focal deficits Resp: normal work of breathing Abdomen: soft, nondistended, nontender to palpation. Incision granulating. Colostomy productive of stool. 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 Extremities: warm and well-perfused   Lab Results:  Recent Labs    12/20/21 1248 12/22/21 0825  WBC 14.0* 14.4*  HGB 10.1* 9.6*  HCT 32.5* 30.9*  PLT 402* 386   BMET Recent Labs    12/22/21 0825 12/23/21 0512  NA 140 137  K 3.6 3.6  CL 113* 105  CO2 24 26  GLUCOSE 186* 145*  BUN 27* 22  CREATININE 0.47 0.42*  CALCIUM 10.2 10.0   PT/INR No results for input(s): LABPROT, INR in the last 72 hours. ABG No results for input(s): PHART, HCO3 in the last 72 hours.  Invalid input(s): PCO2, PO2  Studies/Results: CT ABDOMEN PELVIS W CONTRAST  Result Date: 12/22/2021 CLINICAL DATA:  Abdominal pain, elevated white blood count. EXAM: CT ABDOMEN AND PELVIS WITH CONTRAST TECHNIQUE: Multidetector CT imaging of the abdomen and pelvis was performed using the standard protocol following bolus administration of intravenous contrast. CONTRAST:  45mL OMNIPAQUE IOHEXOL 350 MG/ML SOLN COMPARISON:  December 17, 2021. FINDINGS: Lower chest: Minimal bilateral posterior basilar subsegmental  atelectasis is noted. Hepatobiliary: No gallstones or biliary dilatation is noted. The liver is unremarkable. Pancreas: Unremarkable. No pancreatic ductal dilatation or surrounding inflammatory changes. Spleen: Normal in size without focal abnormality. Adrenals/Urinary Tract: Adrenal glands appear normal. Left renal cyst is noted. Nonobstructive left nephrolithiasis is noted. No hydronephrosis or renal obstruction is noted. Urinary bladder is unremarkable. Stomach/Bowel: Stable appearance of left lower quadrant colostomy as well as right lower quadrant ileostomy. Stomach is unremarkable. Diverticulosis is noted throughout the colon. Stable wall thickening of sigmoid colon is noted which may represent acute diverticulitis or chronic sequela of previous diverticulitis. There is again noted wall thickening and inflammatory changes involving the small bowel loops in the right lower quadrant near the ileostomy suggesting enteritis with other inflammation. There may be slightly increased ill-defined fluid collections in this area, the largest measuring 3.5 by 0.8 cm. This may represent developing abscess, but continued follow-up is recommended. Vascular/Lymphatic: No significant vascular findings are present. No enlarged abdominal or pelvic lymph nodes. Reproductive: Calcified uterine fibroid is noted. No adnexal abnormality is noted. Other: Stable ill-defined densities are noted in the omentum in the left upper quadrant as well as in the mesenteric fat of the anterior pelvis which may represent omental or mesenteric infarcts or other inflammation. Midline surgical incision is again noted. 22 x 10 mm collection of gas and fluid is seen in the incision which may be slightly enlarged compared to prior exam. Musculoskeletal: No acute or significant osseous findings. IMPRESSION: Stable appearance of left lower quadrant colostomy as well as right lower quadrant ileostomy. There is again noted wall thickening and inflammatory  changes  involving small bowel loops in the right lower quadrant near the ileostomy suggesting enteritis with surrounding inflammation. Slightly increased ill-defined fluid collections are noted in this area, the largest measuring 3.5 x 0.8 cm, which may represent developing abscess or abscesses, but continued follow-up is recommended. These do not appear to be amenable to percutaneous drainage at this time. 2.2 x 1.0 cm gas and fluid collection is seen in the midline surgical incision which is slightly increased compared to prior exam, and potentially may represent small abscess. Stable densities are noted in the omentum in the left upper quadrant as well as in the mesenteric fat anteriorly in the pelvis concerning for infarction or other inflammation. Calcified uterine fibroid. Obstructive left nephrolithiasis. Electronically Signed   By: Marijo Conception M.D.   On: 12/22/2021 16:32    Anti-infectives: Anti-infectives (From admission, onward)    Start     Dose/Rate Route Frequency Ordered Stop   11/25/21 1000  piperacillin-tazobactam (ZOSYN) IVPB 3.375 g  Status:  Discontinued        3.375 g 12.5 mL/hr over 240 Minutes Intravenous Every 8 hours 11/25/21 0909 12/01/21 0822   11/17/21 1830  piperacillin-tazobactam (ZOSYN) IVPB 3.375 g        3.375 g 12.5 mL/hr over 240 Minutes Intravenous Every 8 hours 11/17/21 1415 11/22/21 2359   11/17/21 1230  piperacillin-tazobactam (ZOSYN) IVPB 3.375 g        3.375 g 100 mL/hr over 30 Minutes Intravenous  Once 11/17/21 1220 11/17/21 1304       Assessment/Plan: s/p Procedure(s): EXPLORATORY LAPAROTOMY, EXTENDED RIGHT COLECTOMY, END ILEOSTOMY, MUCOUS FISTULA (N/A) PARTIAL COLECTOMY (N/A) Advance diet Wbc elevated. 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 Harlow Asa 11/27 - path: necrotic colon at site of perforation, no malignancy or  dysplasia noted  - CT 12/4 w/ trace peripancreatic and right paracolic gutter simple free fluid that extends inferiorly into the right pelvis with associated peritoneal wall enhancement/thickening. No organized drainable fluid collection.  - Restarted abx 12/5. WBC normalized. Abx stopped. - Minimal oral intake. Was on cortrak + TF 12/7-8. Cortrak became clogged and patient refused replacement 12/8. - Ileostomy output improved. Off Imodium. Cont BID metamucil - WOC following for ileostomy, mucous fistula;  - TID WTD dressing changes to midline incision - Mobilize. PT/OT recommend SNF, TOC consulted - Pathology benign. CT distal descending/proximal sigmoid bowel luminal collapse.   FEN - Regular diet as tolerated, ensure TID, TPN. Cycle TPN at night to try to stimulate appetite and continue Mirtazapine. Begin calorie count today. Per palliative care discussion with family 12/31, they would like to continue aggressive medical management with consideration of G tube placement. Will continue calorie count, consider G tube placement if oral intake does not improve.   VTE - SCDs, Lovenox ID - zosyn 11/27 - 12/2. 12/5-12/11 Foley - purewick, would prefer patient be getting up at least to [facility]   Encephalopathy/FTT - Patient has been on folic acid and thiamine supplements. Noted to have poor motor control and coordination the other day by PT which is different from last week per therapy notes, Neurology consulted - appreciated their input. No further workup needed at this time from their standpoint.   - psych saw 12/27 and determined patient does not have capacity.    LOS: 36 days    Dwan Bolt 12/23/2021

## 2021-12-24 LAB — BASIC METABOLIC PANEL WITH GFR
Anion gap: 5 (ref 5–15)
BUN: 29 mg/dL — ABNORMAL HIGH (ref 8–23)
CO2: 27 mmol/L (ref 22–32)
Calcium: 10.1 mg/dL (ref 8.9–10.3)
Chloride: 106 mmol/L (ref 98–111)
Creatinine, Ser: 0.51 mg/dL (ref 0.44–1.00)
GFR, Estimated: >60 mL/min
Glucose, Bld: 195 mg/dL — ABNORMAL HIGH (ref 70–99)
Potassium: 3.3 mmol/L — ABNORMAL LOW (ref 3.5–5.1)
Sodium: 138 mmol/L (ref 135–145)

## 2021-12-24 LAB — GLUCOSE, CAPILLARY
Glucose-Capillary: 109 mg/dL — ABNORMAL HIGH (ref 70–99)
Glucose-Capillary: 144 mg/dL — ABNORMAL HIGH (ref 70–99)
Glucose-Capillary: 151 mg/dL — ABNORMAL HIGH (ref 70–99)
Glucose-Capillary: 90 mg/dL (ref 70–99)

## 2021-12-24 LAB — MAGNESIUM: Magnesium: 2 mg/dL (ref 1.7–2.4)

## 2021-12-24 LAB — PHOSPHORUS: Phosphorus: 2.6 mg/dL (ref 2.5–4.6)

## 2021-12-24 MED ORDER — TRAVASOL 10 % IV SOLN
INTRAVENOUS | Status: AC
  Filled 2021-12-24: qty 540

## 2021-12-24 MED ORDER — POTASSIUM CHLORIDE 10 MEQ/100ML IV SOLN
10.0000 meq | INTRAVENOUS | Status: AC
  Administered 2021-12-24 (×4): 10 meq via INTRAVENOUS
  Filled 2021-12-24 (×4): qty 100

## 2021-12-24 NOTE — Progress Notes (Signed)
37 Days Post-Op   Subjective/Chief Complaint: No acute changes. Remains afebrile. Brother is at bedside this morning, reports patient ate very little of her meals yesterday.   Objective: Vital signs in last 24 hours: Temp:  [97.5 F (36.4 C)-98.8 F (37.1 C)] 97.9 F (36.6 C) (01/03 0522) Pulse Rate:  [83-113] 83 (01/03 0522) Resp:  [14-18] 18 (01/03 0522) BP: (112-135)/(68-96) 120/69 (01/03 0522) SpO2:  [98 %-100 %] 99 % (01/03 0522) Weight:  [77.9 kg] 77.9 kg (01/03 0500) Last BM Date: 12/24/21  Intake/Output from previous day: 01/02 0701 - 01/03 0700 In: 1762.8 [P.O.:900; I.V.:862.8] Out: 2625 [Urine:2315; Stool:310] Intake/Output this shift: No intake/output data recorded.  General: resting comfortably, NAD Neuro: alert, no focal deficits Resp: normal work of breathing Abdomen: soft, nondistended, nontender to palpation. Midline incision with pink healthy granulation tissue, some purulent drainage from inferior aspect. Ileostomy productive of loose stool. 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 Extremities: warm and well-perfused   Lab Results:  Recent Labs    12/22/21 0825  WBC 14.4*  HGB 9.6*  HCT 30.9*  PLT 386   BMET Recent Labs    12/23/21 0512 12/24/21 0301  NA 137 138  K 3.6 3.3*  CL 105 106  CO2 26 27  GLUCOSE 145* 195*  BUN 22 29*  CREATININE 0.42* 0.51  CALCIUM 10.0 10.1   PT/INR No results for input(s): LABPROT, INR in the last 72 hours. ABG No results for input(s): PHART, HCO3 in the last 72 hours.  Invalid input(s): PCO2, PO2  Studies/Results: CT ABDOMEN PELVIS W CONTRAST  Result Date: 12/22/2021 CLINICAL DATA:  Abdominal pain, elevated white blood count. EXAM: CT ABDOMEN AND PELVIS WITH CONTRAST TECHNIQUE: Multidetector CT imaging of the abdomen and pelvis was performed using the standard protocol following bolus administration of intravenous contrast. CONTRAST:  80mL OMNIPAQUE IOHEXOL 350 MG/ML SOLN COMPARISON:  December 17, 2021. FINDINGS: Lower chest: Minimal bilateral posterior basilar subsegmental atelectasis is noted. Hepatobiliary: No gallstones or biliary dilatation is noted. The liver is unremarkable. Pancreas: Unremarkable. No pancreatic ductal dilatation or surrounding inflammatory changes. Spleen: Normal in size without focal abnormality. Adrenals/Urinary Tract: Adrenal glands appear normal. Left renal cyst is noted. Nonobstructive left nephrolithiasis is noted. No hydronephrosis or renal obstruction is noted. Urinary bladder is unremarkable. Stomach/Bowel: Stable appearance of left lower quadrant colostomy as well as right lower quadrant ileostomy. Stomach is unremarkable. Diverticulosis is noted throughout the colon. Stable wall thickening of sigmoid colon is noted which may represent acute diverticulitis or chronic sequela of previous diverticulitis. There is again noted wall thickening and inflammatory changes involving the small bowel loops in the right lower quadrant near the ileostomy suggesting enteritis with other inflammation. There may be slightly increased ill-defined fluid collections in this area, the largest measuring 3.5 by 0.8 cm. This may represent developing abscess, but continued follow-up is recommended. Vascular/Lymphatic: No significant vascular findings are present. No enlarged abdominal or pelvic lymph nodes. Reproductive: Calcified uterine fibroid is noted. No adnexal abnormality is noted. Other: Stable ill-defined densities are noted in the omentum in the left upper quadrant as well as in the mesenteric fat of the anterior pelvis which may represent omental or mesenteric infarcts or other inflammation. Midline surgical incision is again noted. 22 x 10 mm collection of gas and fluid is seen in the incision which may be slightly enlarged compared to prior exam. Musculoskeletal: No acute or significant osseous findings. IMPRESSION: Stable appearance of left lower quadrant colostomy as well as right  lower quadrant ileostomy. There is again noted wall thickening and inflammatory changes involving small bowel loops in the right lower quadrant near the ileostomy suggesting enteritis with surrounding inflammation. Slightly increased ill-defined fluid collections are noted in this area, the largest measuring 3.5 x 0.8 cm, which may represent developing abscess or abscesses, but continued follow-up is recommended. These do not appear to be amenable to percutaneous drainage at this time. 2.2 x 1.0 cm gas and fluid collection is seen in the midline surgical incision which is slightly increased compared to prior exam, and potentially may represent small abscess. Stable densities are noted in the omentum in the left upper quadrant as well as in the mesenteric fat anteriorly in the pelvis concerning for infarction or other inflammation. Calcified uterine fibroid. Obstructive left nephrolithiasis. Electronically Signed   By: Marijo Conception M.D.   On: 12/22/2021 16:32    Anti-infectives: Anti-infectives (From admission, onward)    Start     Dose/Rate Route Frequency Ordered Stop   11/25/21 1000  piperacillin-tazobactam (ZOSYN) IVPB 3.375 g  Status:  Discontinued        3.375 g 12.5 mL/hr over 240 Minutes Intravenous Every 8 hours 11/25/21 0909 12/01/21 0822   11/17/21 1830  piperacillin-tazobactam (ZOSYN) IVPB 3.375 g        3.375 g 12.5 mL/hr over 240 Minutes Intravenous Every 8 hours 11/17/21 1415 11/22/21 2359   11/17/21 1230  piperacillin-tazobactam (ZOSYN) IVPB 3.375 g        3.375 g 100 mL/hr over 30 Minutes Intravenous  Once 11/17/21 1220 11/17/21 1304       Assessment/Plan: s/p Procedure(s): EXPLORATORY LAPAROTOMY, EXTENDED RIGHT COLECTOMY, END ILEOSTOMY, MUCOUS FISTULA (N/A) PARTIAL COLECTOMY (N/A) 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 Harlow Asa 11/27 - path: necrotic colon at site of perforation, no  malignancy or dysplasia noted  - Recent CT 1/1 with no new drainable collections. Small amount of fluid deep to incision, which is draining via inferior aspect of incision. - Restarted abx 12/5. WBC normalized. Abx stopped. - Minimal oral intake. Was on cortrak + TF 12/7-8. Cortrak became clogged and patient refused replacement 12/8. - Ileostomy output improved. Continue BID metamucil. - WOC following for ileostomy, mucous fistula;  - TID saline WTD dressing changes to midline incision - Pathology benign. CT distal descending/proximal sigmoid bowel luminal collapse.   FEN - Dysphagia 2 diet as tolerated (brother reports patient has difficulty chewing anything that is not soft or pureed). Ensure shakes. Discussed with brother today that give patient's ongoing poor PO intake, G tube placement may be necessary for her to get adequate enteral nutrition. He will discuss with other family members today to make a decision.   VTE - SCDs, Lovenox ID - zosyn 11/27 - 12/2. 12/5-12/11 Foley - purewick, would prefer patient be getting up at least to [facility]   Encephalopathy/FTT - Patient has been on folic acid and thiamine supplements. Noted to have poor motor control and coordination the other day by PT which is different from last week per therapy notes, Neurology consulted - appreciated their input. No further workup needed at this time from their standpoint.   - psych saw 12/27 and determined patient does not have capacity.   Dispo: Patient will need SNF placement at discharge. Discussed with brother this morning. Patient will need to be off TPN and either getting adequate nutrition by mouth or via tube feeds prior to discharge. Will discuss  SNF placement with SW and case management.   LOS: 37 days    Dwan Bolt 12/24/2021

## 2021-12-24 NOTE — Progress Notes (Signed)
PHARMACY - TOTAL PARENTERAL NUTRITION CONSULT NOTE   Indication:  Inability to meet enteral needs  Patient Measurements: Height: 5\' 10"  (177.8 cm) Weight: 77.9 kg (171 lb 11.8 oz) IBW/kg (Calculated) : 68.5 TPN AdjBW (KG): 79.8 Body mass index is 24.64 kg/m. Usual Weight: 80 kg  Assessment: 66 y.o. 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 She is now extended right colectomy and ileostomy. She developed acute metabolic encephalopathy post-op. She was initiated on TF, but tube became clogged on 12/8 and patient refused replacement, and is not eating enough to maintain nutrition.  Calorie count has been ordered.  CCS ordered TPN to start on 12/12, pharmacy to manage.  Glucose / Insulin: No hx DM.   Range 113 to 151 3 units of  insulin used  in past 24 hours  Electrolytes: Potassium low at 3.3, Others are WNL, CorrCa: 11.7 (no Ca in TPN) Renal: SCr <1 stable, BUN  elevated at 29 Hepatic: LFTs WNL, Albumin low Trig: WNL (12/26, 1/2) Intake / Output: - 862 mL  - UOP 1000 mL, stool output: 310 ml MIVF: none GI Imaging:  - 12/27 abd CT: Postsurgical changes with a right lower quadrant ileostomy and left colostomy. No bowel obstruction. 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 Interval decrease in the size of the fluid density inferior to the gallbladder. No drainable fluid collection Identified. Complex collection in the deep subcutaneous soft tissues of theanterior abdominal wall along the midline extending to the skin at the level of the umbilicus GI Surgeries / Procedures:  11/27: Expiratory laparotomy, extended right colectomy, end ileostomy  Central access: PICC placed 12/11 TPN start date: 12/12  Nutritional Goals:  RD  Assessment: Estimated Needs Total Energy Estimated Needs: 1950-2150 Total Protein Estimated Needs: 95-105g Total Fluid Estimated Needs: 2L/day  Current Nutrition:  Regular diet- Minimal intake  Ensure Enlive TID (20 g of protein and 350 kcal) *Intake not consistent Juven supplement packet (2.5g of protein and 95 kcal) *Intake not consistent Half rate cyclic TPN - 3,474 mL provides 54g protein and 1094 kcal per day. Attempting to stimulate appetite during the day.    Now: - Potassium chloride 10 meq IV x 4   Plan:  At 25:95:  Continue cyclic TPN giving ~63% of nutritional needs. Infuse 1,000 mL over 14 hrs: 38 mL/hr x 1 hr, then 77 mL/hr x 12 hrs, then 38 mL/hr x 1 hr. Electrolytes in TPN. No change Na 65mEq/L Inc K 22mEq/L Ca 41mEq/L Mg 3mEq/L Phos 37mmol/L Cl:Ac max Ac SSI sensitiveTID and adjust as needed Continue multivitamin PO Thiamine per neurology Monitor TPN labs on Mon/Thurs BMP, magnesium, phosphorus with AM labs  Follow up calorie counts and/or PEG tube discussions   Royetta Asal, PharmD, BCPS 12/24/2021 7:17 AM

## 2021-12-24 NOTE — Progress Notes (Signed)
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 Harlow Asa 11/27.  Per recent MD notes: "Encephalopathy/FTT - Patient has been on folic acid and thiamine supplements. Noted to have poor motor control and coordination the other day by PT which is different from last week per therapy notes, Neurology consulted - appreciated their input. No further workup needed at this time from their standpoint.    - psych saw 12/27 and determined patient does not have capacity. Patient has been worsening since admission.  She was confused from POD 1 on. Question if she was like this prior to admission, but lived alone and no one aware of these issues."   OT comments  Unfortunately pt has regressed overall since initial Evaluation as evidenced by as evidenced by a decreased score on 6-Clicks AM-PAC measure of occupational Performance with previous score of 12/24, and current score of 8/24 indicating a significantly higher need for assistance with basic ADLs and functional mobility.  Pt was very poor sitting balance with extreme trunk flexion and inability to raise head for more than 1-2 seconds x 2 reps and with hard LT lateral lean/push. Pt also with decreased use of RT UE which was almost flaccid this visit and not used at all to assist with ADLs or mobility.  Pt's POC has been updated to reflect current needs and downgraded to bed level ADLs and squat pivot to [facility] which pt was unable to safely attempt today.  Pt demonstrates poor rehab potential currently and may benefit from continued skilled OT to increase overall function, but OT expectation for progress is guarded at this time.       Recommendations for follow up therapy are one component of a multi-disciplinary  discharge planning process, led by the attending physician.  Recommendations may be updated based on patient status, additional functional criteria and insurance authorization.    Follow Up Recommendations  Skilled nursing-short term rehab (<3 hours/day)    Assistance Recommended at Discharge Frequent or constant Supervision/Assistance  Patient can return home with the following  A lot of help with bathing/dressing/bathroom;Two people to help with bathing/dressing/bathroom;Two people to help with walking and/or transfers;A lot of help with walking and/or transfers;Assistance with cooking/housework   Equipment Recommendations  None recommended by OT    Recommendations for Other Services      Precautions / Restrictions Precautions Precautions: Fall Precaution Comments: abdominal surgery, RUQ ileostomy Restrictions Weight Bearing Restrictions: No       Mobility Bed Mobility Overal bed mobility: Needs Assistance Bed Mobility: Rolling;Sidelying to Sit;Sit to Sidelying Rolling: Max assist;+2 for physical assistance Sidelying to sit: Max assist;+2 for physical assistance     Sit to sidelying: Max assist;+2 for physical assistance General bed mobility comments: mutlimodal cues for log roll technique, pt requiring assist to initiate and complete bed mobility; extensive assist with trunk and LEs in both directions. Trunk leaning/pushing hard to LT side today.    Transfers Overall transfer level:  (Please see ADL section for OT note.)                       Balance Overall balance assessment: Needs assistance Sitting-balance support: Feet supported;Single extremity supported Sitting balance-Leahy Scale: Zero Sitting balance - Comments: Able to correct to midline x2 reps briefly  after facilitation of RT foream weight bearing to inhibit LT lateral pushing, however pt unable to maintain more than a few seonds each attempt then not at all.  Pt bending far forward at trunk and  unable to lift head more than 2 reps for very brief moments.  Pt's brother needed to hold pt's Bilateral shoulders with Max As to facilitate upright sitting for ADLs. Postural control: Left lateral lean;Posterior lean                                 ADL either performed or assessed with clinical judgement   ADL Overall ADL's : Needs assistance/impaired   Eating/Feeding Details (indicate cue type and reason): Pt refused to demonstrate self-feeding to OT. Pt reported, "I've already eaten what I wanted" but most of food still on tray. Grooming: Sitting;Maximal assistance;Wash/dry face;Cueing for sequencing Grooming Details (indicate cue type and reason): Pt's RT UE nearly flaccid for this session, which was not the case upon Evaluation. Washcloth placed in pt's LT hand as pt not reaching for cloth with cues. Pt required MAX multimodal cue and significantly increased time to process instructions to wash face which pt required Max As to complete with pt's brother behind the pt, holding her shoulders to promote upright sitting and OT perfoming hand over hand cues.  Total As to wash pt's glasses and total Assist to don as pt would not initiate arm movements to reach for glasses or to position them.             Lower Body Dressing: Total assistance;Bed level Lower Body Dressing Details (indicate cue type and reason): To don socks.   Toilet Transfer Details (indicate cue type and reason): Unable to  safely stand pt. Very poor sitting balance.                Extremity/Trunk Assessment Upper Extremity Assessment RUE Deficits / Details: RT UE now almost flaccid with pt not using in any functional way despite cues, hand over hand. RUE Coordination: decreased fine motor;decreased gross motor LUE Deficits / Details: Proximally ~2/5 and distally ~2+ to 3-/5.  Very weak LUE Coordination: decreased fine motor;decreased gross motor       Cervical / Trunk Assessment Cervical / Trunk  Assessment: Other exceptions Cervical / Trunk Exceptions: Very flexed trunk    Vision Baseline Vision/History: 1 Wears glasses Ability to See in Adequate Light: 2 Moderately impaired     Perception Perception Perception: Impaired Comments: Pt is not using RUE in any functional way. Nearly flaccid this visit.   Praxis Praxis Praxis: Impaired Praxis Impairment Details: Initiation;Motor planning    Cognition Arousal/Alertness: Awake/alert Behavior During Therapy: Flat affect Overall Cognitive Status: Impaired/Different from baseline Area of Impairment: Orientation;Attention;Memory;Following commands;Safety/judgement;Awareness;Problem solving                 Orientation Level: Disoriented to;Time;Situation;Place (Unable to state DOB despite cues, increased time) Current Attention Level: Focused Memory: Decreased recall of precautions;Decreased short-term memory Following Commands: Follows one step commands inconsistently Safety/Judgement: Decreased awareness of safety;Decreased awareness of deficits Awareness: Intellectual Problem Solving: Slow processing;Decreased initiation;Difficulty sequencing;Requires verbal cues;Requires tactile cues            Exercises Other Exercises Other Exercises: Deferred due to IV nurse to room to work with pt.   Shoulder Instructions       General Comments      Pertinent Vitals/ Pain  Pain Assessment: Faces Faces Pain Scale: Hurts whole lot Pain Location: LLE and R foot with movement- RN aware and reported this to OT as OT entering room. Pain Descriptors / Indicators: Guarding;Grimacing;Moaning Pain Intervention(s): Limited activity within patient's tolerance;Monitored during session;Repositioned;Utilized relaxation techniques  Home Living                                          Prior Functioning/Environment              Frequency  Min 2X/week        Progress Toward Goals  OT Goals(current  goals can now be found in the care plan section)  Progress towards OT goals: Goals drowngraded-see care plan  Acute Rehab OT Goals OT Goal Formulation: Patient unable to participate in goal setting Time For Goal Achievement: 01/07/22 Potential to Achieve Goals: Poor ADL Goals Pt Will Perform Grooming: bed level;with max assist Pt Will Perform Lower Body Bathing: bed level;with max assist Pt Will Perform Lower Body Dressing: bed level;with max assist Pt Will Transfer to Toilet: squat pivot transfer;with +2 assist;with max assist Additional ADL Goal #1: Pt will improved static sitting balance to fair and tolerate upright posture for at least 30 seconds at a time x 2 reps while EOB. Additional ADL Goal #3: Pt will demonstrate RT UE AAROM WFL and increase strength by 1/2 grade in order to allow RUE to assist in ADLs and functional mobility.  Plan Discharge plan remains appropriate    Co-evaluation                 AM-PAC OT "6 Clicks" Daily Activity     Outcome Measure   Help from another person eating meals?: A Lot Help from another person taking care of personal grooming?: A Lot Help from another person toileting, which includes using toliet, bedpan, or urinal?: Total Help from another person bathing (including washing, rinsing, drying)?: Total Help from another person to put on and taking off regular upper body clothing?: Total Help from another person to put on and taking off regular lower body clothing?: Total 6 Click Score: 8    End of Session    OT Visit Diagnosis: Unsteadiness on feet (R26.81);Other symptoms and signs involving cognitive function;Pain;History of falling (Z91.81);Repeated falls (R29.6);Muscle weakness (generalized) (M62.81) Pain - Right/Left: Left Pain - part of body: Leg   Activity Tolerance Other (comment);Patient limited by pain (Limited by confusion and profound weakness)   Patient Left in bed;with bed alarm set;with family/visitor present;with  nursing/sitter in room   Nurse Communication Mobility status        Time: 7741-2878 OT Time Calculation (min): 30 min  Charges: OT General Charges $OT Visit: 1 Visit OT Treatments $Self Care/Home Management : 8-22 mins $Therapeutic Activity: 8-22 mins  Anderson Malta, OT Acute Rehab Services Office: [phone] 12/24/2021  Julien Girt 12/24/2021, 10:57 AM

## 2021-12-24 NOTE — Progress Notes (Signed)
Nutrition Follow-up  DOCUMENTATION CODES:   Severe malnutrition in context of acute illness/injury  INTERVENTION:   Recommend PEG placement -Initiate Osmolite 1.5 @ 20 ml/hr and advance by 10 ml every 8 hours to goal rate of 60 ml/hr. -provides 2160 kcals, 90g protein and 1097 ml H2O -Will need free water flushes of 200 ml every 6 hours (800 ml)  -48 hour Calorie Count  -Ensure Enlive po TID, each supplement provides 350 kcal and 20 grams of protein  -Milk served with every meal tray (pt enjoys 2% -provides 120 kcals and 8g protein per carton)  -1 packet Juven BID, each packet provides 95 calories, 2.5 grams of protein (collagen), and 9.8 grams of carbohydrate (3 grams sugar); also contains 7 grams of L-arginine and L-glutamine, 300 mg vitamin C, 15 mg vitamin E, 1.2 mcg vitamin B-12, 9.5 mg zinc, 200 mg calcium, and 1.5 g  Calcium Beta-hydroxy-Beta-methylbutyrate to support wound healing (this can be given via PEG, if placed)  -Cyclic TPN management per Pharmacy  NUTRITION DIAGNOSIS:   Severe Malnutrition related to acute illness as evidenced by energy intake < or equal to 50% for > or equal to 5 days, moderate fat depletion, severe muscle depletion.  Ongoing  GOAL:   Patient will meet greater than or equal to 90% of their needs  Not meeting.  MONITOR:   PO intake, Supplement acceptance, Labs, Weight trends, I & O's (TPN)  ASSESSMENT:   66 year old female who presents to the emergency department with sudden onset of abdominal pain and distention.CT scan of abdomen and pelvis shows findings consistent with free air with a large pneumoperitoneum.  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  Per surgery note, plan is either for pt to consume PO and wean off TPN or d/c with feeding tube.   Per chart review, pt's family seems to be in favor of feeding tube placement. Thus, recommend PEG placement. Tube feeding recommendations above.  Surgery  ordered 48 hour Calorie count. This is Calorie count #4 this admission. Pt has never been able to meet her nutritional needs via PO.  Spoke with NT regarding calorie count. States pt mainly consuming orange sherbet.   1/2: Items consumed: 2 orange sherbets, one 2% milk . Total: 340 kcals, 8g protein  Cyclic TPN continues x 14 hrs, providing 1094 kcals and 54g protein.  Admission weight: 176 lbs Current weight: 171 lbs  I/Os: -13.2L since 12/20 UOP: 2315 ml x 24 hrs Ostomy: 460 ml x 24 hrs  Medications: Ferrous sulfate, Folic acid, Remeron, Multivitamin with minerals daily, Metamucil, Thiamine  Labs reviewed: CBGs: 109-151 Low K  Diet Order:   Diet Order             DIET DYS 2 Room service appropriate? Yes; Fluid consistency: Thin  Diet effective now                   EDUCATION NEEDS:   Education needs have been addressed  Skin:  Skin Assessment: Skin Integrity Issues: Skin Integrity Issues:: Incisions Incisions: 11/27 abdomen  Last BM:  1/3 -ileostomy  Height:   Ht Readings from Last 1 Encounters:  11/17/21 5\' 10"  (1.778 m)    Weight:   Wt Readings from Last 1 Encounters:  12/24/21 77.9 kg    BMI:  Body mass index is 24.64 kg/m.  Estimated Nutritional Needs:   Kcal:  1950-2150  Protein:  95-105g  Fluid:  2L/day   Clayton Bibles, MS, RD,  LDN Inpatient Clinical Dietitian Contact information available via Amion

## 2021-12-25 LAB — CULTURE, BLOOD (ROUTINE X 2)
Culture: NO GROWTH
Culture: NO GROWTH
Special Requests: ADEQUATE
Special Requests: ADEQUATE

## 2021-12-25 LAB — GLUCOSE, CAPILLARY
Glucose-Capillary: 109 mg/dL — ABNORMAL HIGH (ref 70–99)
Glucose-Capillary: 126 mg/dL — ABNORMAL HIGH (ref 70–99)
Glucose-Capillary: 145 mg/dL — ABNORMAL HIGH (ref 70–99)
Glucose-Capillary: 150 mg/dL — ABNORMAL HIGH (ref 70–99)
Glucose-Capillary: 98 mg/dL (ref 70–99)

## 2021-12-25 LAB — PHOSPHORUS: Phosphorus: 2.4 mg/dL — ABNORMAL LOW (ref 2.5–4.6)

## 2021-12-25 LAB — BASIC METABOLIC PANEL
Anion gap: 6 (ref 5–15)
BUN: 21 mg/dL (ref 8–23)
CO2: 27 mmol/L (ref 22–32)
Calcium: 10.4 mg/dL — ABNORMAL HIGH (ref 8.9–10.3)
Chloride: 105 mmol/L (ref 98–111)
Creatinine, Ser: 0.43 mg/dL — ABNORMAL LOW (ref 0.44–1.00)
GFR, Estimated: >60 mL/min (ref >60)
Glucose, Bld: 158 mg/dL — ABNORMAL HIGH (ref 70–99)
Potassium: 4 mmol/L (ref 3.5–5.1)
Sodium: 138 mmol/L (ref 135–145)

## 2021-12-25 LAB — MAGNESIUM: Magnesium: 2 mg/dL (ref 1.7–2.4)

## 2021-12-25 MED ORDER — TRAVASOL 10 % IV SOLN
INTRAVENOUS | Status: AC
  Filled 2021-12-25: qty 540

## 2021-12-25 MED ORDER — POTASSIUM PHOSPHATES 15 MMOLE/5ML IV SOLN
15.0000 mmol | Freq: Once | INTRAVENOUS | Status: AC
  Administered 2021-12-25: 15 mmol via INTRAVENOUS
  Filled 2021-12-25: qty 5

## 2021-12-25 MED ORDER — DEXTROSE 10 % IV SOLN: INTRAVENOUS | Status: DC

## 2021-12-25 NOTE — Progress Notes (Signed)
PHARMACY - TOTAL PARENTERAL NUTRITION CONSULT NOTE   Indication:  Inability to meet enteral needs  Patient Measurements: Height: 5\' 10"  (177.8 cm) Weight: 77.9 kg (171 lb 11.8 oz) IBW/kg (Calculated) : 68.5 TPN AdjBW (KG): 79.8 Body mass index is 24.64 kg/m. Usual Weight: 80 kg  Assessment: 66 y.o. 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 She is now extended right colectomy and ileostomy. She developed acute metabolic encephalopathy post-op. She was initiated on TF, but tube became clogged on 12/8 and patient refused replacement, and is not eating enough to maintain nutrition.  Calorie count has been ordered.  CCS ordered TPN to start on 12/12, pharmacy to manage.  Glucose / Insulin: No hx DM.   Range 90 to 150 2 units of  insulin used  in past 24 hours  Electrolytes:    Phosphorus a bit low at 2.4. Others are WNL, other than CorrCa: 12 (no Ca in TPN) Renal: SCr <1 stable, BUN WNL Hepatic: LFTs WNL, Albumin low Trig: WNL (12/26, 1/2) Intake / Output: - 1100 mL  - UOP 1150 mL, stool output: 750 ml MIVF: none GI Imaging:  - 12/27 abd CT: Postsurgical changes with a right lower quadrant ileostomy and left colostomy. No bowel obstruction. 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 Interval decrease in the size of the fluid density inferior to the gallbladder. No drainable fluid collection Identified. Complex collection in the deep subcutaneous soft tissues of theanterior abdominal wall along the midline extending to the skin at the level of the umbilicus GI Surgeries / Procedures:  11/27: Expiratory laparotomy, extended right colectomy, end ileostomy  Central access: PICC placed 12/11 TPN start date: 12/12  Nutritional  Goals:  RD Assessment: Estimated Needs Total Energy Estimated Needs: 1950-2150 Total Protein Estimated Needs: 95-105g Total Fluid Estimated Needs: 2L/day  Current Nutrition:  Regular diet- Minimal intake  Ensure Enlive TID (20 g of protein and 350 kcal) *Intake not consistent Juven supplement packet (2.5g of protein and 95 kcal) *Intake not consistent Half rate cyclic TPN - 4,650 mL provides 54g protein and 1094 kcal per day. Attempting to stimulate appetite during the day.    Now  - Potassium phosphate 15 mmol IV x1     Plan:  At 35:46:  Continue cyclic TPN giving ~56% of nutritional needs. Infuse 1,000 mL over 14 hrs: 38 mL/hr x 1 hr, then 77 mL/hr x 12 hrs, then 38 mL/hr x 1 hr. Electrolytes in TPN. No change Na 27mEq/L K 62mEq/L Ca 93mEq/L Mg 25mEq/L Inc Phos 18 mmol/L Cl:Ac max Ac SSI sensitiveTID and adjust as needed Continue multivitamin PO Thiamine per neurology Monitor TPN labs on Mon/Thurs Follow up calorie counts and/or PEG tube discussions   Royetta Asal, PharmD, BCPS 12/25/2021 10:02 AM

## 2021-12-25 NOTE — Progress Notes (Signed)
[facility] Progress Note  38 Days Post-Op  Subjective: CC:  Patient denies pain. Making stool via ostomy. Brother at bedside states that patient is not really eating. She is drinking milk and does like juven. He also expresses concerns about patient placement/nursing facility options. Confirms that patient is weak and has regressed with PT.  Objective: Vital signs in last 24 hours: Temp:  [98 F (36.7 C)-99.2 F (37.3 C)] 98 F (36.7 C) (01/04 4431) Pulse Rate:  [93-95] 95 (01/04 0608) Resp:  [17] 17 (01/04 0608) BP: (109-142)/(65-85) 142/85 (01/04 0608) SpO2:  [98 %] 98 % (01/04 0608) Last BM Date: 12/24/21  Intake/Output from previous day: 01/03 0701 - 01/04 0700 In: 800.1 [P.O.:390; I.V.:10.1; IV Piggyback:400] Out: 1900 [Urine:1150; Stool:750] Intake/Output this shift: No intake/output data recorded.  PE: General: resting comfortably, NAD Neuro: alert, no focal deficits Resp: normal work of breathing Abdomen: soft, nondistended, nontender to palpation. Midline incision with pink healthy granulation tissue, some purulent drainage from inferior aspect. Ileostomy productive of loose stool. 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 Extremities: warm and well-perfused Neuro: FC, no focal deficit, grip strength 3/5 RUE and 4/5 LUE.   Lab Results:  No results for input(s): WBC, HGB, HCT, PLT in the last 72 hours. BMET Recent Labs    12/24/21 0301 12/25/21 0340  NA 138 138  K 3.3* 4.0  CL 106 105  CO2 27 27  GLUCOSE 195* 158*  BUN 29* 21  CREATININE 0.51 0.43*  CALCIUM 10.1 10.4*   PT/INR No results for input(s): LABPROT, INR in the last 72 hours. CMP     Component Value Date/Time   NA 138 12/25/2021 0340   K 4.0 12/25/2021 0340   CL 105 12/25/2021 0340   CO2 27 12/25/2021 0340   GLUCOSE 158 (H) 12/25/2021 0340   BUN 21 12/25/2021 0340   CREATININE 0.43 (L) 12/25/2021 0340   CALCIUM 10.4 (H) 12/25/2021 0340   PROT 7.6 12/23/2021 0512    ALBUMIN 2.0 (L) 12/23/2021 0512   AST 17 12/23/2021 0512   ALT 14 12/23/2021 0512   ALKPHOS 81 12/23/2021 0512   BILITOT 0.3 12/23/2021 0512   GFRNONAA >60 12/25/2021 0340   Lipase     Component Value Date/Time   LIPASE 22 11/17/2021 0955       Studies/Results: No results found.  Anti-infectives: Anti-infectives (From admission, onward)    Start     Dose/Rate Route Frequency Ordered Stop   11/25/21 1000  piperacillin-tazobactam (ZOSYN) IVPB 3.375 g  Status:  Discontinued        3.375 g 12.5 mL/hr over 240 Minutes Intravenous Every 8 hours 11/25/21 0909 12/01/21 0822   11/17/21 1830  piperacillin-tazobactam (ZOSYN) IVPB 3.375 g        3.375 g 12.5 mL/hr over 240 Minutes Intravenous Every 8 hours 11/17/21 1415 11/22/21 2359   11/17/21 1230  piperacillin-tazobactam (ZOSYN) IVPB 3.375 g        3.375 g 100 mL/hr over 30 Minutes Intravenous  Once 11/17/21 1220 11/17/21 1304        Assessment/Plan  s/p Procedure(s): EXPLORATORY LAPAROTOMY, EXTENDED RIGHT COLECTOMY, END ILEOSTOMY, MUCOUS FISTULA (N/A) PARTIAL COLECTOMY (N/A) 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 Harlow Asa 11/27 - path: necrotic colon at site of perforation, no malignancy or dysplasia noted  - Recent CT 1/1 with no new drainable collections. Small amount of fluid deep to incision, which is draining  via inferior aspect of incision. - Restarted abx 12/5. WBC normalized. Abx stopped. - Minimal oral intake. Was on cortrak + TF 12/7-8. Cortrak became clogged and patient refused replacement 12/8. - Ileostomy output improved. Continue BID metamucil. - WOC following for ileostomy, mucous fistula;  - TID saline WTD dressing changes to midline incision - Pathology benign. CT distal descending/proximal sigmoid bowel luminal collapse.   FEN - Dysphagia 2 diet as tolerated (brother reports patient has difficulty chewing anything that is  not soft or pureed). Ensure shakes. G tube may be necessary to get adequate nutrition, I spoke to the patients brother today and he will continue to discuss with family.    VTE - SCDs, Lovenox ID - [email or web address] 11/27 - 12/2. 12/5-12/11 Foley - purewick, would prefer patient be getting up at least to [facility]   Encephalopathy/FTT - Patient has been on folic acid and thiamine supplements. Noted to have poor motor control and coordination the other day by PT which is different from last week per therapy notes, Neurology consulted - appreciated their input. No further workup needed at this time from their standpoint.   - psych saw 12/27 and determined patient does not have capacity.    Dispo: Patient will need SNF placement at discharge. Discussed with brother this morning. Patient will need to be off TPN and either getting adequate nutrition by mouth or via tube feeds prior to discharge. Will discuss SNF placement with SW and case management. I have asked case management to speak to the patients brother in person today.     LOS: 34 days    Obie Dredge, [facility] Please see Amion for pager number during day hours 7:00am-4:30pm

## 2021-12-25 NOTE — Progress Notes (Signed)
Nutrition Follow-up  DOCUMENTATION CODES:   Severe malnutrition in context of acute illness/injury  INTERVENTION:   Recommend PEG placement -Initiate Osmolite 1.5 @ 20 ml/hr and advance by 10 ml every 8 hours to goal rate of 60 ml/hr. -provides 2160 kcals, 90g protein and 1097 ml H2O -Will need free water flushes of 200 ml every 6 hours (800 ml)   -48 hour Calorie Count -ends today after dinner   -Ensure Enlive po TID, each supplement provides 350 kcal and 20 grams of protein   -Milk served with every meal tray (pt enjoys 2% -provides 120 kcals and 8g protein per carton)   -1 packet Juven BID, each packet provides 95 calories, 2.5 grams of protein (collagen), and 9.8 grams of carbohydrate (3 grams sugar); also contains 7 grams of L-arginine and L-glutamine, 300 mg vitamin C, 15 mg vitamin E, 1.2 mcg vitamin B-12, 9.5 mg zinc, 200 mg calcium, and 1.5 g  Calcium Beta-hydroxy-Beta-methylbutyrate to support wound healing (this can be given via PEG, if placed)   -Cyclic TPN management per Pharmacy  NUTRITION DIAGNOSIS:   Severe Malnutrition related to acute illness as evidenced by energy intake < or equal to 50% for > or equal to 5 days, moderate fat depletion, severe muscle depletion.  Ongoing.  GOAL:   Patient will meet greater than or equal to 90% of their needs  Not meeting.  MONITOR:   PO intake, Supplement acceptance, Labs, Weight trends, I & O's (TPN)  REASON FOR ASSESSMENT:   Consult Calorie Count  ASSESSMENT:   66 year old female who presents to the emergency department with sudden onset of abdominal pain and distention.CT scan of abdomen and pelvis shows findings consistent with free air with a large pneumoperitoneum.  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  Per surgery note, plan is either for pt to consume PO and wean off TPN or d/c with feeding tube.    Per chart review, pt's family seems to be in favor of feeding tube  placement. Thus, recommend PEG placement. Tube feeding recommendations above.   Surgery ordered 48 hour Calorie count. This is Calorie count #4 this admission. Pt has never been able to meet her nutritional needs via PO.    1/3: Items consumed: 1 orange sherbet, 1/2 2% milk . Total: 170 kcals, 4g protein Per documentation in flowsheets, pt refused dinner, NT nor brother could convince her to eat.  Spoke with NT and RN, pt consumed a few bites of oatmeal this morning, nothing substantial. RN was able to get her to eat 1/2 cup of pudding (~60 kcals, 1.5g protein).  Spoke with pt, brother at bedside. Pt's brother asking about diet recommendations. Reviewed the supplements we have trialed with patient. Brother states she was offered an Ensure today but she refused to drink any. Brother then left room to take a call.   Pt reports not wanting anything. When asked if she liked pudding, she grimaced. Not able to give me much as far as preferences today. Encouraged PO intake, to sip on supplements and fluids.   Cyclic TPN continues x 14 hrs, providing 1094 kcals and 54g protein.   Admission weight: 176 lbs Current weight: 171 lbs   I/Os: -13.4L since 12/21 UOP: 1150 ml x 24 hrs Ostomy: 750 ml x 24 hrs   Medications: Ferrous sulfate, Folic acid, Remeron, Multivitamin with minerals daily, Metamucil, Thiamine, K-Phos   Labs reviewed: CBGs: 90-150 Low Phos  Diet Order:   Diet Order  DIET DYS 2 Room service appropriate? Yes; Fluid consistency: Thin  Diet effective now                   EDUCATION NEEDS:   Education needs have been addressed  Skin:  Skin Assessment: Skin Integrity Issues: Skin Integrity Issues:: Incisions Incisions: 11/27 abdomen  Last BM:  1/3 -ileostomy  Height:   Ht Readings from Last 1 Encounters:  11/17/21 5\' 10"  (1.778 m)    Weight:   Wt Readings from Last 1 Encounters:  12/24/21 77.9 kg    BMI:  Body mass index is 24.64  kg/m.  Estimated Nutritional Needs:   Kcal:  1950-2150  Protein:  95-105g  Fluid:  2L/day   Clayton Bibles, MS, RD, LDN Inpatient Clinical Dietitian Contact information available via Amion

## 2021-12-25 NOTE — TOC Progression Note (Signed)
Transition of Care [facility]) - Progression Note    Patient Details  Name: Sherry Hampton MRN: [id number] Date of Birth: 1956-08-21  Transition of Care [facility]) CM/SW Contact  Lennart Pall, LCSW Phone Number: 12/25/2021, 2:46 PM  Clinical Narrative:    Met with pt and brother, Sherry Hampton, this morning to review dc planning progress.  Both aware of the barrier her current need for TPN has in securing SNF.  Brother confirms there is discussion of possible PEG placement, however, he reports it is unclear if this is an option given the other abdominal surgeries that have been done.  He states that family will make decision on peg "if they decide it's even an option." Brother requesting that TOC reach out to a facility in [place] that family has spoken with to see if they can consider patient for admission - [facility].  I have spoken with the admissions coordinator Almyra Free @ [phone]) and she reports that they are an LTAC facility but are happy to review patient's documentation to see if they could meet care needs.  (I did explain to her that we were not able to secure LTAC here as pt did not have a 3 day ICU stay and this may be a barrier for them as well.)  Documentation faxed for them to review and will await decision/ follow up with pt and family.   Expected Discharge Plan: [place] Barriers to Discharge: Continued Medical Work up, Ship broker, SNF Pending bed offer  Expected Discharge Plan and Services Expected Discharge Plan: [place] In-house Referral: Clinical Social Work     Living arrangements for the past 2 months: Single Family Home                                       Social Determinants of Health (SDOH) Interventions    Readmission Risk Interventions No flowsheet data found.

## 2021-12-26 LAB — GLUCOSE, CAPILLARY
Glucose-Capillary: 111 mg/dL — ABNORMAL HIGH (ref 70–99)
Glucose-Capillary: 113 mg/dL — ABNORMAL HIGH (ref 70–99)
Glucose-Capillary: 114 mg/dL — ABNORMAL HIGH (ref 70–99)
Glucose-Capillary: 143 mg/dL — ABNORMAL HIGH (ref 70–99)

## 2021-12-26 LAB — COMPREHENSIVE METABOLIC PANEL
ALT: 15 U/L (ref 0–44)
AST: 16 U/L (ref 15–41)
Albumin: 2 g/dL — ABNORMAL LOW (ref 3.5–5.0)
Alkaline Phosphatase: 82 U/L (ref 38–126)
Anion gap: 6 (ref 5–15)
BUN: 22 mg/dL (ref 8–23)
CO2: 26 mmol/L (ref 22–32)
Calcium: 11.3 mg/dL — ABNORMAL HIGH (ref 8.9–10.3)
Chloride: 107 mmol/L (ref 98–111)
Creatinine, Ser: 0.5 mg/dL (ref 0.44–1.00)
GFR, Estimated: >60 mL/min (ref >60)
Glucose, Bld: 106 mg/dL — ABNORMAL HIGH (ref 70–99)
Potassium: 3.9 mmol/L (ref 3.5–5.1)
Sodium: 139 mmol/L (ref 135–145)
Total Bilirubin: 0.5 mg/dL (ref 0.3–1.2)
Total Protein: 7.4 g/dL (ref 6.5–8.1)

## 2021-12-26 LAB — MAGNESIUM: Magnesium: 2 mg/dL (ref 1.7–2.4)

## 2021-12-26 LAB — PHOSPHORUS: Phosphorus: 4 mg/dL (ref 2.5–4.6)

## 2021-12-26 MED ORDER — TRAVASOL 10 % IV SOLN
INTRAVENOUS | Status: AC
  Filled 2021-12-26: qty 540

## 2021-12-26 NOTE — Progress Notes (Signed)
[facility] Progress Note  39 Days Post-Op  Subjective: CC:  Patient laying in bed in NAD. Oriented to self only this morning. Brother at bedside.  RN at bedside, says they D/C-ed purewick due to skin breakdown of the thigh.  Pt has not been OOB in days.   Objective: Vital signs in last 24 hours: Temp:  [97.3 F (36.3 C)-98.4 F (36.9 C)] 98.4 F (36.9 C) (01/05 0626) Pulse Rate:  [94-108] 94 (01/05 0626) Resp:  [18-20] 18 (01/05 0626) BP: (116-124)/(67-79) 116/73 (01/05 0626) SpO2:  [98 %-99 %] 99 % (01/05 0626) Last BM Date: 12/26/21 (via ileostomy)  Intake/Output from previous day: 01/04 0701 - 01/05 0700 In: 356 [P.O.:60; I.V.:37.1; IV Piggyback:258.9] Out: 750 [Urine:400; Stool:350] Intake/Output this shift: Total I/O In: 240 [P.O.:240] Out: 0   PE: General: resting comfortably, NAD Neuro: alert, no focal deficits Resp: normal work of breathing Abdomen: soft, nondistended, nontender to palpation. Midline incision with pink healthy granulation tissue, some purulent drainage from wound base. periwound in tact. Ileostomy productive of loose stool. 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 Extremities: warm and well-perfused Neuro: FC, no focal deficit, grip strength 3/5 RUE and 4/5 LUE.   Lab Results:  BMET Recent Labs    12/25/21 0340 12/26/21 0348  NA 138 139  K 4.0 3.9  CL 105 107  CO2 27 26  GLUCOSE 158* 106*  BUN 21 22  CREATININE 0.43* 0.50  CALCIUM 10.4* 11.3*   PT/INR No results for input(s): LABPROT, INR in the last 72 hours. CMP     Component Value Date/Time   NA 139 12/26/2021 0348   K 3.9 12/26/2021 0348   CL 107 12/26/2021 0348   CO2 26 12/26/2021 0348   GLUCOSE 106 (H) 12/26/2021 0348   BUN 22 12/26/2021 0348   CREATININE 0.50 12/26/2021 0348   CALCIUM 11.3 (H) 12/26/2021 0348   PROT 7.4 12/26/2021 0348   ALBUMIN 2.0 (L) 12/26/2021 0348   AST 16 12/26/2021 0348   ALT 15 12/26/2021 0348   ALKPHOS 82 12/26/2021 0348    BILITOT 0.5 12/26/2021 0348   GFRNONAA >60 12/26/2021 0348   Lipase     Component Value Date/Time   LIPASE 22 11/17/2021 0955       Studies/Results: No results found.  Anti-infectives: Anti-infectives (From admission, onward)    Start     Dose/Rate Route Frequency Ordered Stop   11/25/21 1000  piperacillin-tazobactam (ZOSYN) IVPB 3.375 g  Status:  Discontinued        3.375 g 12.5 mL/hr over 240 Minutes Intravenous Every 8 hours 11/25/21 0909 12/01/21 0822   11/17/21 1830  piperacillin-tazobactam (ZOSYN) IVPB 3.375 g        3.375 g 12.5 mL/hr over 240 Minutes Intravenous Every 8 hours 11/17/21 1415 11/22/21 2359   11/17/21 1230  piperacillin-tazobactam (ZOSYN) IVPB 3.375 g        3.375 g 100 mL/hr over 30 Minutes Intravenous  Once 11/17/21 1220 11/17/21 1304        Assessment/Plan  s/p Procedure(s): EXPLORATORY LAPAROTOMY, EXTENDED RIGHT COLECTOMY, END ILEOSTOMY, MUCOUS FISTULA (N/A) PARTIAL COLECTOMY (N/A) 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 Harlow Asa 11/27 - path: necrotic colon at site of perforation, no malignancy or dysplasia noted  - Recent CT 1/1 with no new drainable collections. Small amount of fluid deep to incision, which is draining via inferior aspect of incision. - Restarted abx 12/5.  WBC normalized. Abx stopped. - Minimal oral intake. Was on cortrak + TF 12/7-8. Cortrak became clogged and patient refused replacement 12/8. - Ileostomy output improved. Continue BID metamucil.  - WOC following for ileostomy, mucous fistula;  - TID saline WTD dressing changes to midline incision - Pathology benign. CT distal descending/proximal sigmoid bowel luminal collapse.   FEN - Dysphagia 2 diet as tolerated (brother reports patient has difficulty chewing anything that is not soft or pureed).  Protein supplements per dietitian. G tube may be necessary to get adequate nutrition.   Lengthy discussion with the patient's brother at bedside today.  He expresses concern about a gastrostomy tube causing increased pain to his sister.  He stated his goal of getting Ms. Branagan home to her family safely.  He also does not want her nutrition to suffer.  We discussed palliative care and focusing on comfort versus proceeding with IR gastrostomy tube. We discussed the urgency of this decision.  Patient's brother voiced understanding. I welcomed his questions.  We will follow-up with the family.   VTE - SCDs, Lovenox ID - [email or web address] 11/27 - 12/2. 12/5-12/11 Foley - Purewick was removed due to skin breakdown of the inner thigh.  We need to continue attempts to get the patient up out of bed, including using the bedside commode, as much as possible.   Encephalopathy/FTT - Patient has been on folic acid and thiamine supplements. Noted to have poor motor control and coordination the other day by PT which is different from last week per therapy notes, Neurology consulted - appreciated their input. No further workup needed at this time from their standpoint.   - psych saw 12/27 and determined patient does not have capacity.    Dispo: Patient will need SNF placement at discharge. Appreciate case management sending documents to [facility] in [place].  We will continue to work on disposition.  We will follow-up with family today or first thing tomorrow morning regarding placement of gastrostomy tube.    LOS: 28 days    Obie Dredge, [facility] Please see Amion for pager number during day hours 7:00am-4:30pm

## 2021-12-26 NOTE — Progress Notes (Signed)
Received report from [place], [place]. Agree with previous RN assessment. Patient resting comfortably in bed a this time, no needs expressed. Call light and personal items placed in reach of patient and fall safety precautions in place.

## 2021-12-26 NOTE — Final Progress Note (Signed)
Patient has refused drinking ensure supplement despite attempts to lighten it up with ice chips or mix it with either vanilla or chocolate ice cream.  Patient states "I don't like it".  Patient drinks and finishes Juven supplement.

## 2021-12-26 NOTE — Progress Notes (Signed)
Nutrition Follow-up  DOCUMENTATION CODES:   Severe malnutrition in context of acute illness/injury  INTERVENTION:   Recommend PEG placement -Initiate Osmolite 1.5 @ 20 ml/hr and advance by 10 ml every 8 hours to goal rate of 60 ml/hr. -provides 2160 kcals, 90g protein and 1097 ml H2O -Will need free water flushes of 200 ml every 6 hours (800 ml)   -48 hour Calorie Count -continues through today after dinner as pt did not have 3 full meals 1/4  -Changed diet to "not appropriate for room service" to ensure pt receives house trays at mealtimes.    -Ensure Enlive po TID, each supplement provides 350 kcal and 20 grams of protein   -Milk served with every meal tray (pt enjoys 2% -provides 120 kcals and 8g protein per carton)   -1 packet Juven BID, each packet provides 95 calories, 2.5 grams of protein (collagen)   -Cyclic TPN management per Pharmacy  NUTRITION DIAGNOSIS:   Severe Malnutrition related to acute illness as evidenced by energy intake < or equal to 50% for > or equal to 5 days, moderate fat depletion, severe muscle depletion.  Ongoing.  GOAL:   Patient will meet greater than or equal to 90% of their needs  Meeting ~56% of needs with TPN  MONITOR:   PO intake, Supplement acceptance, Labs, Weight trends, I & O's (TPN)  ASSESSMENT:   66 year old female who presents to the emergency department with sudden onset of abdominal pain and distention.CT scan of abdomen and pelvis shows findings consistent with free air with a large pneumoperitoneum.  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  Calorie Count 1/4: Items consumed: 2 Juven, 50% of a pudding, 25% of a 2% milk Totals: 280 kcals, 8.5 g protein  Discussed pt's nutrition and meals with surgery PA. Pt's brother reporting lunch and dinner yesterday consisted of only 2% milk. Meals were not ordered at these times. Other days pt has had at least 2 meals ordered for pt. Changed diet to "not  appropriate for room service" to ensure pt receives house trays at mealtimes.   Pt's brother off the unit at time of visit.  This AM, pt consumed a cup of strawberry ice cream (132 kcals, 2g protein). Per RN, pt refusing Ensure. Accepted Juven and consumed (95 kcals, 2.5g protein).  Per Pharmacy, pt did not receive TPN last night given wrong supplies was delivered. Received D10 infusion.    Admission weight: 176 lbs Current weight (1/3): 171 lbs   I/Os: -10.3L since 12/22 Ostomy: 350 ml x 24 hrs   Medications: Ferrous sulfate, Folic acid, Remeron, Multivitamin with minerals daily, Metamucil, Thiamine   Labs reviewed: CBGs: 98-145  Diet Order:   Diet Order             DIET DYS 2 Room service appropriate? No; Fluid consistency: Thin  Diet effective now                   EDUCATION NEEDS:   Education needs have been addressed  Skin:  Skin Assessment: Skin Integrity Issues: Skin Integrity Issues:: Incisions Incisions: 11/27 abdomen  Last BM:  1/5 -ileostomy  Height:   Ht Readings from Last 1 Encounters:  11/17/21 5\' 10"  (1.778 m)    Weight:   Wt Readings from Last 1 Encounters:  12/24/21 77.9 kg    BMI:  Body mass index is 24.64 kg/m.  Estimated Nutritional Needs:   Kcal:  1950-2150  Protein:  95-105g  Fluid:  2L/day  Clayton Bibles, MS, RD, LDN Inpatient Clinical Dietitian Contact information available via Amion

## 2021-12-26 NOTE — Progress Notes (Signed)
PHARMACY - TOTAL PARENTERAL NUTRITION CONSULT NOTE   Indication:  Inability to meet enteral needs  Patient Measurements: Height: 5\' 10"  (177.8 cm) Weight: 77.9 kg (171 lb 11.8 oz) IBW/kg (Calculated) : 68.5 TPN AdjBW (KG): 79.8 Body mass index is 24.64 kg/m. Usual Weight: 80 kg  Assessment: 66 y.o. 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 She is now extended right colectomy and ileostomy. She developed acute metabolic encephalopathy post-op. She was initiated on TF, but tube became clogged on 12/8 and patient refused replacement, and is not eating enough to maintain nutrition.  Calorie count has been completed with minimal intake per discussion with RD.  CCS discussing possibility of G tube with family. CCS ordered TPN to start on 12/12, pharmacy to manage.  Glucose / Insulin: No hx DM.   CBGs < 150 1 unit of  insulin used  in past 24 hours  Electrolytes:   elytes wnl except for CoCa 12.9 (no Ca in TPN) Renal: SCr <1 stable, BUN WNL Hepatic: LFTs WNL, Albumin low Trig: WNL (12/26, 1/2) Intake / Output: + 713 mL - UOP 400 mL, stool output: 350 ml MIVF: D10 at 42 mL/hr initiated 1/4 PM as incorrect TPN tubing attached to bag - has received ~67g CHO thus far from this GI Imaging:  - 12/27 abd CT: Postsurgical changes with a right lower quadrant ileostomy and left colostomy. No bowel obstruction. 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 Interval decrease in the size of the fluid density inferior to the gallbladder. No drainable fluid collection Identified. Complex collection in the deep subcutaneous soft tissues of theanterior abdominal wall along the midline extending to the skin at the level of the umbilicus GI Surgeries /  Procedures:  11/27: Expiratory laparotomy, extended right colectomy, end ileostomy  Central access: PICC placed 12/11 TPN start date: 12/12  Nutritional Goals: RD Assessment: Estimated Needs Total Energy Estimated Needs: 1950-2150 Total Protein Estimated Needs: 95-105g Total Fluid Estimated Needs: 2L/day  Current Nutrition:  Regular diet- Minimal intake  Ensure Enlive TID (20 g of protein and 350 kcal) *Intake not consistent Juven supplement packet (2.5g of protein and 95 kcal) *Intake not consistent Half rate cyclic TPN - 9,211 mL provides 54g protein and 1094 kcal per day. Attempting to stimulate appetite during the day.    Plan:  Now: Discontinue D10 drip  At 94:17:  Continue cyclic TPN giving ~40% of nutritional needs. Infuse 1,000 mL over 14 hrs: 38 mL/hr x 1 hr, then 77 mL/hr x 12 hrs, then 38 mL/hr x 1 hr. Electrolytes in TPN. No change Na 48mEq/L K 96mEq/L Ca 87mEq/L Mg 6mEq/L Phos 18 mmol/L Cl:Ac max Ac SSI sensitive TID and adjust as needed Continue multivitamin PO Thiamine per neurology Monitor TPN labs on Mon/Thurs Follow up calorie counts and/or PEG tube discussions   Dimple Nanas, PharmD 12/26/2021 8:42 AM

## 2021-12-26 NOTE — Progress Notes (Signed)
Attempted to assist patient with meal. Patient ate 3 bites of magic cup with constant reminders to swallow from RN. Patient refused any other food item offered. Began crying after the bites of magic cup and refused any more of her meal.

## 2021-12-26 NOTE — Progress Notes (Signed)
Assisted patient with lunch.  Constant encouragement given while attempting feeding lunch.  Patient is able to swallow dysphagia 2 diet but pockets most food and spits it out eventually.  Patient only took 2 bites and refused the rest of meal.  Will keep encouraging taking fluids as tolerated.

## 2021-12-27 LAB — BASIC METABOLIC PANEL
Anion gap: 6 (ref 5–15)
BUN: 25 mg/dL — ABNORMAL HIGH (ref 8–23)
CO2: 28 mmol/L (ref 22–32)
Calcium: 9.9 mg/dL (ref 8.9–10.3)
Chloride: 106 mmol/L (ref 98–111)
Creatinine, Ser: 0.36 mg/dL — ABNORMAL LOW (ref 0.44–1.00)
GFR, Estimated: >60 mL/min (ref >60)
Glucose, Bld: 157 mg/dL — ABNORMAL HIGH (ref 70–99)
Potassium: 3.5 mmol/L (ref 3.5–5.1)
Sodium: 140 mmol/L (ref 135–145)

## 2021-12-27 LAB — GLUCOSE, CAPILLARY
Glucose-Capillary: 126 mg/dL — ABNORMAL HIGH (ref 70–99)
Glucose-Capillary: 146 mg/dL — ABNORMAL HIGH (ref 70–99)
Glucose-Capillary: 112 mg/dL — ABNORMAL HIGH (ref 70–99)
Glucose-Capillary: 136 mg/dL — ABNORMAL HIGH (ref 70–99)

## 2021-12-27 LAB — FUNGUS CULTURE, BLOOD
Culture: NO GROWTH
Special Requests: ADEQUATE

## 2021-12-27 LAB — PHOSPHORUS: Phosphorus: 2 mg/dL — ABNORMAL LOW (ref 2.5–4.6)

## 2021-12-27 LAB — MAGNESIUM: Magnesium: 2 mg/dL (ref 1.7–2.4)

## 2021-12-27 MED ORDER — TRAVASOL 10 % IV SOLN
INTRAVENOUS | Status: AC
  Filled 2021-12-27: qty 540

## 2021-12-27 MED ORDER — POTASSIUM PHOSPHATES 15 MMOLE/5ML IV SOLN
15.0000 mmol | Freq: Once | INTRAVENOUS | Status: AC
  Administered 2021-12-27: 15 mmol via INTRAVENOUS
  Filled 2021-12-27: qty 5

## 2021-12-27 MED ORDER — INSULIN ASPART 100 UNIT/ML IJ SOLN
0.0000 [IU] | Freq: Three times a day (TID) | INTRAMUSCULAR | Status: DC
  Administered 2021-12-27 – 2021-12-28 (×4): 1 [IU] via SUBCUTANEOUS
  Administered 2021-12-28: 2 [IU] via SUBCUTANEOUS
  Administered 2021-12-28: 1 [IU] via SUBCUTANEOUS
  Administered 2021-12-29: 2 [IU] via SUBCUTANEOUS
  Administered 2021-12-29 – 2021-12-30 (×3): 1 [IU] via SUBCUTANEOUS
  Administered 2021-12-30: 2 [IU] via SUBCUTANEOUS
  Administered 2021-12-31 (×2): 1 [IU] via SUBCUTANEOUS

## 2021-12-27 NOTE — TOC Progression Note (Signed)
Transition of Care [facility]) - Progression Note    Patient Details  Name: Valeria Boza MRN: [id number] Date of Birth: August 05, 1956  [facility]) CM/SW Contact  Lennart Pall, LCSW Phone Number: 12/27/2021, 3:24 PM  Clinical Narrative:    Following up with facility in [place] that family had asked TOC to submit documentation for review.  Per admissions coordinator, they are an LTAC facility and not SNF.  They, also, cannot accept with TPN but could POSSIBLY consider if peg is placed.  Have left a VM for brother with this information - continue to follow.   Expected Discharge Plan: [place] Barriers to Discharge: Continued Medical Work up, Ship broker, SNF Pending bed offer  Expected Discharge Plan and Services Expected Discharge Plan: [place] In-house Referral: Clinical Social Work     Living arrangements for the past 2 months: Single Family Home                                       Social Determinants of Health (SDOH) Interventions    Readmission Risk Interventions No flowsheet data found.

## 2021-12-27 NOTE — Progress Notes (Signed)
[facility] Progress Note  40 Days Post-Op  Subjective: CC:  More alert this morning. Oriented to self only. Unable to tell me where we are or what happened. When I re-directed her and reminded her about her surgery she states "I hope I can get better". Brother not at bedside this morning.   Objective: Vital signs in last 24 hours: Temp:  [97.6 F (36.4 C)-99.4 F (37.4 C)] 97.6 F (36.4 C) (01/06 0622) Pulse Rate:  [90-95] 95 (01/06 0622) Resp:  [16-18] 18 (01/06 0622) BP: (96-112)/(57-89) 112/89 (01/06 0622) SpO2:  [97 %-100 %] 100 % (01/06 0622) Last BM Date: 12/27/21  Intake/Output from previous day: 01/05 0701 - 01/06 0700 In: 1810.3 [P.O.:900; I.V.:910.3] Out: 650 [Stool:650] Intake/Output this shift: Total I/O In: 80 [P.O.:60; I.V.:20] Out: 150 [Stool:150]  PE: General: resting comfortably, NAD Neuro: alert, no focal deficits Resp: normal work of breathing Abdomen: soft, nondistended, nontender to palpation. Midline incision with dressing in place. Ileostomy productive of loose stool. 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 Extremities: warm and well-perfused Neuro: FC, no focal deficit  Lab Results:  BMET Recent Labs    12/26/21 0348 12/27/21 0340  NA 139 140  K 3.9 3.5  CL 107 106  CO2 26 28  GLUCOSE 106* 157*  BUN 22 25*  CREATININE 0.50 0.36*  CALCIUM 11.3* 9.9   PT/INR No results for input(s): LABPROT, INR in the last 72 hours. CMP     Component Value Date/Time   NA 140 12/27/2021 0340   K 3.5 12/27/2021 0340   CL 106 12/27/2021 0340   CO2 28 12/27/2021 0340   GLUCOSE 157 (H) 12/27/2021 0340   BUN 25 (H) 12/27/2021 0340   CREATININE 0.36 (L) 12/27/2021 0340   CALCIUM 9.9 12/27/2021 0340   PROT 7.4 12/26/2021 0348   ALBUMIN 2.0 (L) 12/26/2021 0348   AST 16 12/26/2021 0348   ALT 15 12/26/2021 0348   ALKPHOS 82 12/26/2021 0348   BILITOT 0.5 12/26/2021 0348   GFRNONAA >60 12/27/2021 0340   Lipase     Component Value  Date/Time   LIPASE 22 11/17/2021 0955   Anti-infectives: Anti-infectives (From admission, onward)    Start     Dose/Rate Route Frequency Ordered Stop   11/25/21 1000  piperacillin-tazobactam (ZOSYN) IVPB 3.375 g  Status:  Discontinued        3.375 g 12.5 mL/hr over 240 Minutes Intravenous Every 8 hours 11/25/21 0909 12/01/21 0822   11/17/21 1830  piperacillin-tazobactam (ZOSYN) IVPB 3.375 g        3.375 g 12.5 mL/hr over 240 Minutes Intravenous Every 8 hours 11/17/21 1415 11/22/21 2359   11/17/21 1230  piperacillin-tazobactam (ZOSYN) IVPB 3.375 g        3.375 g 100 mL/hr over 30 Minutes Intravenous  Once 11/17/21 1220 11/17/21 1304        Assessment/Plan  s/p Procedure(s): EXPLORATORY LAPAROTOMY, EXTENDED RIGHT COLECTOMY, END ILEOSTOMY, MUCOUS FISTULA (N/A) PARTIAL COLECTOMY (N/A) 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 Harlow Asa 11/27 - path: necrotic colon at site of perforation, no malignancy or dysplasia noted  - Recent CT 1/1 with no new drainable collections. Small amount of fluid deep to incision, which is draining via inferior aspect of incision. - Restarted abx 12/5. WBC normalized. Abx stopped. - Minimal oral intake. Was on cortrak + TF 12/7-8. Cortrak became clogged and patient refused replacement 12/8. - Ileostomy output improved.  Continue BID metamucil.  - WOC following for ileostomy, mucous fistula;  - TID saline WTD dressing changes to midline incision - Pathology benign. CT distal descending/proximal sigmoid bowel luminal collapse.   FEN - Dysphagia 2 diet as tolerated (brother reports patient has difficulty chewing anything that is not soft or pureed).  Protein supplements per dietitian. G tube may be necessary to get adequate nutrition.  Will re-visit the patient once her brother arrives today to discuss plan of care.   VTE - SCDs, Lovenox ID - [email or web address] 11/27 - 12/2.  12/5-12/11 Foley - Purewick was removed due to skin breakdown of the inner thigh.  We need to continue attempts to get the patient up out of bed, including using the bedside commode, as much as possible.   Encephalopathy/FTT - Patient has been on folic acid and thiamine supplements. Noted to have poor motor control and coordination the other day by PT which is different from last week per therapy notes, Neurology consulted - appreciated their input. No further workup needed at this time from their standpoint.   - psych saw 12/27 and determined patient does not have capacity.    Dispo: Patient will need SNF placement at discharge. Appreciate case management sending documents to [facility] in [place].  We will continue to work on disposition.  We will follow-up with family today regarding placement of gastrostomy tube.    LOS: 40 days    Obie Dredge, [facility] Please see Amion for pager number during day hours 7:00am-4:30pm

## 2021-12-27 NOTE — Progress Notes (Signed)
°   12/27/21 1700  Clinical Encounter Type  Visited With Patient  Visit Type Follow-up  Referral From Nurse  Consult/Referral To Chaplain  Spiritual Encounters  Spiritual Needs Sacred text;Prayer;Emotional  Stress Factors  Patient Stress Factors None identified  Family Stress Factors None identified   Chaplain was requested by staff RN to visit Sherry Hampton at her room today. Staf RN stated that patient was emotional and isolated and needed support.  Visited room and patient was asleep at this time. Will follow up and remain available as needed.

## 2021-12-27 NOTE — Progress Notes (Signed)
Nutrition Follow-up  DOCUMENTATION CODES:   Severe malnutrition in context of acute illness/injury  INTERVENTION:   Recommend PEG placement -Initiate Osmolite 1.5 @ 20 ml/hr and advance by 10 ml every 8 hours to goal rate of 60 ml/hr. -provides 2160 kcals, 90g protein and 1097 ml H2O -Will need free water flushes of 200 ml every 6 hours (800 ml)  -D/c Calorie Count   -Ensure Enlive po TID, each supplement provides 350 kcal and 20 grams of protein   -Milk served with every meal tray (pt enjoys 2% -provides 120 kcals and 8g protein per carton)   -1 packet Juven BID, each packet provides 95 calories, 2.5 grams of protein (collagen)   -Cyclic TPN management per Pharmacy  NUTRITION DIAGNOSIS:   Severe Malnutrition related to acute illness as evidenced by energy intake < or equal to 50% for > or equal to 5 days, moderate fat depletion, severe muscle depletion.  Ongoing.  GOAL:   Patient will meet greater than or equal to 90% of their needs  Meeting ~56% of needs with TPN  MONITOR:   PO intake, Supplement acceptance, Labs, Weight trends, I & O's (TPN)  ASSESSMENT:   66 year old female who presents to the emergency department with sudden onset of abdominal pain and distention.CT scan of abdomen and pelvis shows findings consistent with free air with a large pneumoperitoneum.  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  Calorie Count 1/5: B: strawberry ice cream = 132 kcals, 2g protein L: 2 bites -insubstantial D: refused Supplements: 3 bites of Magic Cup, RN reporting refuses Ensure, 2 Juven = 230 kcals, 6g protein Total: 362 kcals, 8g protein  Cyclic TPN continues x 14 hrs, providing 1094 kcals and 54g protein.  Admission weight: 176 lbs Weight 1/3: 171 lbs  I/Os: -10.4L since 12/23 Ostomy: 800 ml x 24 hrs  Medications: Ferrous sulfate, Folic acid, Remeron, Multivitamin with minerals daily, Metamucil, Thiamine, K-Phos  Labs  reviewed:  CBGs: 98-150 Low Phos  Diet Order:   Diet Order             DIET DYS 2 Room service appropriate? No; Fluid consistency: Thin  Diet effective now                   EDUCATION NEEDS:   Education needs have been addressed  Skin:  Skin Assessment: Skin Integrity Issues: Skin Integrity Issues:: Incisions, Other (Comment) Incisions: 11/27 abdomen Other: left thigh pressure injury  Last BM:  1/6 -ileostomy  Height:   Ht Readings from Last 1 Encounters:  11/17/21 5\' 10"  (1.778 m)    Weight:   Wt Readings from Last 1 Encounters:  12/24/21 77.9 kg    BMI:  Body mass index is 24.64 kg/m.  Estimated Nutritional Needs:   Kcal:  1950-2150  Protein:  95-105g  Fluid:  2L/day   Clayton Bibles, MS, RD, LDN Inpatient Clinical Dietitian Contact information available via Amion

## 2021-12-27 NOTE — Progress Notes (Signed)
PHARMACY - TOTAL PARENTERAL NUTRITION CONSULT NOTE   Indication:  Inability to meet enteral needs  Patient Measurements: Height: 5\' 10"  (177.8 cm) Weight: 77.9 kg (171 lb 11.8 oz) IBW/kg (Calculated) : 68.5 TPN AdjBW (KG): 79.8 Body mass index is 24.64 kg/m. Usual Weight: 80 kg  Assessment: 66 y.o. 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 She is now extended right colectomy and ileostomy. She developed acute metabolic encephalopathy post-op. She was initiated on TF, but tube became clogged on 12/8 and patient refused replacement, and is not eating enough to maintain nutrition.  Calorie count was extended to end 1/5 PM as patient did not consume 3 full meals on 1/4.  CCS discussing possibility of G tube with family. CCS ordered TPN to start on 12/12, pharmacy to manage.  Glucose / Insulin: No hx DM.   CBGs < 160 2 unit of insulin used  in past 24 hours  Electrolytes:  elytes wnl except for CoCa 11.5 (improving - no Ca in TPN) and Phos 2 Renal: SCr <1 stable, BUN WNL Hepatic: LFTs WNL, Albumin low Trig: WNL (12/26, 1/2) Intake / Output: + 1200 mL - UOP - x4 documented but not quantified, stool output: 650 ml MIVF: None GI Imaging:  - 12/27 abd CT: Postsurgical changes with a right lower quadrant ileostomy and left colostomy. No bowel obstruction. 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 Interval decrease in the size of the fluid density inferior to the gallbladder. No drainable fluid collection Identified. Complex collection in the deep subcutaneous soft tissues of theanterior abdominal wall along the midline extending to the skin at the level of the umbilicus GI Surgeries / Procedures:  11/27: Expiratory laparotomy, extended  right colectomy, end ileostomy  Central access: PICC placed 12/11 TPN start date: 12/12  Nutritional Goals: RD Assessment: Estimated Needs Total Energy Estimated Needs: 1950-2150 Total Protein Estimated Needs: 95-105g Total Fluid Estimated Needs: 2L/day  Current Nutrition:  Regular diet- Minimal intake, patient noted to be pocketing food and will spit out Ensure Enlive TID (20 g of protein and 350 kcal) *Intake not consistent Juven supplement packet (2.5g of protein and 95 kcal) *Intake not consistent Half rate cyclic TPN - 7,342 mL provides 54g protein and 1094 kcal per day. Attempting to stimulate appetite during the day.    Plan:  Now: Potassium phosphate 15 mmol IV x1 (will provide ~22 mEq K)  At 87:68:  Continue cyclic TPN giving ~11% of nutritional needs. Infuse 1,000 mL over 14 hrs: 38 mL/hr x 1 hr, then 77 mL/hr x 12 hrs, then 38 mL/hr x 1 hr. Electrolytes in TPN. Na 85mEq/L K 10mEq/L Ca 28mEq/L Mg 1mEq/L Phos 20 mmol/L Cl:Ac max Ac SSI sensitive TID and adjust as needed Continue multivitamin PO Thiamine per neurology Monitor TPN labs on Mon/Thurs Follow up calorie counts and/or PEG tube discussions   Dimple Nanas, PharmD 12/27/2021 7:37 AM

## 2021-12-27 NOTE — Progress Notes (Signed)
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 Harlow Asa 11/27.  Per recent MD notes: "Encephalopathy/FTT - Patient has been on folic acid and thiamine supplements. Noted to have poor motor control and coordination the other day by PT which is different from last week per therapy notes, Neurology consulted - appreciated their input. No further workup needed at this time from their standpoint.    - psych saw 12/27 and determined patient does not have capacity. Patient has been worsening since admission.  She was confused from POD 1 on. Question if she was like this prior to admission, but lived alone and no one aware of these issues."    PT Comments    Patient with long hospital admission of 40 days this date. This PT familiar with patient from start of admission and performed initial evaluation on 11/18/21 in which pt was able to stand and transfer bed, chair with +2 Mod Assist. Patient was able to ambulate distance of ~180' on 11/30 with +2 Min assist and walker for support. At start of hospital course pt's greatest limitations were pain and poor awareness to deficits related to extent of surgery. Patient now limited by severe and profound weakness and new neurologic symptoms presenting on extremity assessment today.  CURRENT FUNCTIONAL STATUS: Patient presents with positive UMN testing of clonus on Rt LE and UE at ankle, and wrist and hypertonicity observed at hamstring, biceps, and triceps. Lt LE hypersensitive to touch and with 0/5 strength and only trace activation for toe extension. Pt with impaired sensation to light touch throughout all extremities and pt with no proprioceptive awareness unable to detect joint motion in all extremities as wrists and  hips/ankles.   Patient has significant atrophy of LE musculature and profound weakness may be linked to extended hospital stay with limited OOB activity. However, UMN signs are inconsistent with generalized weakness expected from the limited activity. Proprioceptive/light touch impairments may also be limited by pt's cognitive limitations.   To date at this point patient has had CT of head on 12/5 and MRI without contrast on 12/21 with no acute findings; no further work up has been completed. Neurology consulted for pt on 12/20 and ordered the MRI for brain and planned on additional MRI of cervical spine. This was never completed due to patient declining exam. Per Neuro note on 12/20 pt had 5/5 strength but noted "mild drift that looks more like proprioceptive loss then weakness". Neurology signed off on 12/21.  Patient's presentation raises significant concern. This therapist feels the patient's presentation warrants further investigation by medical team to determine etiology of symptoms as no further infectious or neurologic work up has been pursued at this point. Acute therapy will continue efforts to mobilize patient and continue to assess symptoms changes.     Follow Up Recommendations  Skilled nursing-short term rehab (<3 hours/day)     Assistance Recommended at Discharge Frequent or constant Supervision/Assistance  Patient can return home with the following Two people to help with walking and/or transfers;Two people to help with bathing/dressing/bathroom;Direct supervision/assist for financial management;Direct supervision/assist for medications management;Assistance with feeding;Assistance with cooking/housework;Assist for transportation;Help with stairs or ramp for entrance   Equipment Recommendations  Hospital bed (lift)    Recommendations for Other Services Other (comment) (NEURO consult and Medical  Team management with Hospitalist to coordinate care.)     Precautions / Restrictions  Precautions Precautions: Fall Precaution Comments: abdominal surgery, RUQ ileostomy Restrictions Weight Bearing Restrictions: No    Recommendations for follow up therapy are one component of a multi-disciplinary discharge planning process, led by the attending physician.  Recommendations may be updated based on patient status, additional functional criteria and insurance authorization.   Mobility  Bed Mobility               General bed mobility comments: pt placed in chair position at EOS and MAX assist with pt griping OT/PT hands with bil UE's to pull trunk forward off of bed. pt with improved use of UE's and hand to grip. performed x2.    Transfers                        Ambulation/Gait                   Stairs             Wheelchair Mobility    Modified Rankin (Stroke Patients Only)       Balance                                            Cognition Arousal/Alertness: Awake/alert Behavior During Therapy: Flat affect;Anxious (labile-sad) Overall Cognitive Status: Impaired/Different from baseline Area of Impairment: Orientation;Attention;Memory;Following commands;Safety/judgement;Awareness;Problem solving                 Orientation Level: Disoriented to;Time;Situation;Place (knows she is in the hospital) Current Attention Level: Sustained Memory: Decreased recall of precautions;Decreased short-term memory Following Commands: Follows one step commands inconsistently;Follows one step commands with increased time Safety/Judgement: Decreased awareness of safety;Decreased awareness of deficits Awareness: Emergent Problem Solving: Slow processing;Decreased initiation;Difficulty sequencing;Requires verbal cues;Requires tactile cues General Comments: Pt labile and crying intermittently as she is gaining awareness of deficits and limitations. Pt very aware she is doing unwell but has no knowledge of why or what the cause  is. pt has significant weakness and motor  processiong/initation impairments.        Exercises Other Exercises Other Exercises: HOH to perform a pseudo chest press holding water pitcher with bilateral hands x 5. LUE able to grossly perform with minimal assit - RUE needing more MOd assist.    General Comments        Pertinent Vitals/Pain Pain Assessment: Faces Faces Pain Scale: Hurts whole lot Breathing: normal Negative Vocalization: occasional moan/groan, low speech, negative/disapproving quality Facial Expression: sad, frightened, frown Body Language: tense, distressed pacing, fidgeting Consolability: distracted or reassured by voice/touch PAINAD Score: 4 Pain Location: BLEs though > in LLE > in lower leg Pain Descriptors / Indicators: Guarding;Grimacing;Moaning Pain Intervention(s): Limited activity within patient's tolerance;Monitored during session    Home Living                          Prior Function            PT Goals (current goals can now be found in the care plan section) Acute Rehab PT Goals Patient Stated Goal: to get better and regain mobility PT Goal Formulation: With patient Time For Goal Achievement: 01/01/22 Potential to Achieve Goals: Fair    Frequency    Min 2X/week      PT Plan  Co-evaluation   Reason for Co-Treatment: Complexity of the patient's impairments (multi-system involvement)   OT goals addressed during session: Strengthening/ROM      AM-PAC PT "6 Clicks" Mobility   Outcome Measure  Help needed turning from your back to your side while in a flat bed without using bedrails?: Total Help needed moving from lying on your back to sitting on the side of a flat bed without using bedrails?: Total Help needed moving to and from a bed to a chair (including a wheelchair)?: Total Help needed standing up from a chair using your arms (e.g., wheelchair or bedside chair)?: Total Help needed to walk in hospital room?:  Total Help needed climbing 3-5 steps with a railing? : Total 6 Click Score: 6    End of Session   Activity Tolerance: Patient tolerated treatment well Patient left: in bed;with call bell/phone within reach;with bed alarm set Nurse Communication: Mobility status (sister called to talk to pt, needs call back.) PT Visit Diagnosis: Muscle weakness (generalized) (M62.81);Other abnormalities of gait and mobility (R26.89);Other symptoms and signs involving the nervous system (R29.898)     Time: 5397-6734 PT Time Calculation (min) (ACUTE ONLY): 34 min  Charges:  $Therapeutic Activity: 8-22 mins                     Verner Mould, DPT Acute Rehabilitation Services Office [phone] Pager [phone]    Jacques Navy 12/27/2021, 5:44 PM

## 2021-12-27 NOTE — Progress Notes (Signed)
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 Harlow Asa 11/27.  Per recent MD notes: "Encephalopathy/FTT - Patient has been on folic acid and thiamine supplements. Noted to have poor motor control and coordination the other day by PT which is different from last week per therapy notes, Neurology consulted - appreciated their input. No further workup needed at this time from their standpoint.    - psych saw 12/27 and determined patient does not have capacity. Patient has been worsening since admission.  She was confused from POD 1 on. Question if she was like this prior to admission, but lived alone and no one aware of these issues."   OT comments  Patient presents today supine in bed. She is teary eyed and emotional throughout treatment.She exhibits an emerging awareness of her deficits. Her cognition continues to be impaired but she is able to consistently follow commands today. She exhibits profound weakness - with an almost flaccid right dominant upper extremity, a weak LUE, an almost flaccid LLE with foot drop, and a very weak RLE. Clonus was noted in RUE - in wrist extension and tricep extension. Patient required hand over hand to wash face and apply lotion to right hand with left. She required active assist for all UE movement - to raise arms, to place arms on pillow, to perform pseudo chest press movement.   She exhibits difficulty with initiation, motor planning and coordination. This can result in looking like she is not attempting movement - when she is - because muscle activation is so minimal and because she needs increased time to process and initiate.  This therapist treated patient 12/23 and this is a profound difference in presentation -  with global weakness almost presenting as flaccidity in two extremities and clonus. Sensation testing highly limited due to patient's cognitive deficits so hard to ascertain. Would recommend further investigation into symptomology.     Recommendations for follow up therapy are one component of a multi-disciplinary discharge planning process, led by the attending physician.  Recommendations may be updated based on patient status, additional functional criteria and insurance authorization.    Follow Up Recommendations  Skilled nursing-short term rehab (<3 hours/day)    Assistance Recommended at Discharge Frequent or constant Supervision/Assistance  Patient can return home with the following  A lot of help with bathing/dressing/bathroom;Two people to help with bathing/dressing/bathroom;Two people to help with walking and/or transfers;A lot of help with walking and/or transfers;Assistance with cooking/housework   Equipment Recommendations  None recommended by OT    Recommendations for Other Services      Precautions / Restrictions Precautions Precautions: Fall Precaution Comments: abdominal surgery, RUQ ileostomy Restrictions Weight Bearing Restrictions: No       Mobility Bed Mobility                    Transfers                         Balance                                           ADL either performed or assessed with clinical judgement  ADL Overall ADL's : Needs assistance/impaired     Grooming: Maximal assistance Grooming Details (indicate cue type and reason): Hand over hand to wash face and eyes with left hand. Hand over hand to rub lotion on to right hand. Despite multimodal cues patient could not perform motion without assist.                                    Extremity/Trunk Assessment Upper Extremity Assessment RUE Deficits / Details: Grossly 2-/5 strength throughout RUE. requires active assist for all  movement and use. Clonus noted in wrist extension, tricep extension RUE Sensation:  (hard to assess - but may be impaired.) RUE Coordination: decreased fine motor;decreased gross motor LUE Deficits / Details: Proximally ~2/5 and distally ~2+ to 3-/5. Not as prominent as RUE - but a catch and release is noted with quick elbow movement. LUE Coordination: decreased fine motor;decreased gross motor   Lower Extremity Assessment Lower Extremity Assessment: Defer to PT evaluation        Vision Baseline Vision/History: 1 Wears glasses     Perception     Praxis      Cognition Arousal/Alertness: Awake/alert Behavior During Therapy: WFL for tasks assessed/performed (teary due to deficits) Overall Cognitive Status: Impaired/Different from baseline Area of Impairment: Orientation;Attention;Memory;Following commands;Safety/judgement;Awareness;Problem solving                 Orientation Level: Place;Time Current Attention Level: Selective Memory: Decreased short-term memory Following Commands: Follows one step commands consistently Safety/Judgement:  (aware of deficits and emotional) Awareness: Emergent Problem Solving: Slow processing;Difficulty sequencing;Requires verbal cues;Requires tactile cues;Decreased initiation General Comments: Emerging awareness of deficits and tearful during treatment when she could not do something. Some initiation deficits - but would could seem like decreased initiation is also profound weakness, motor control deficits and slow processing.          Exercises Other Exercises Other Exercises: HOH to perform a pseudo chest press holding water pitcher with bilateral hands x 5. LUE able to grossly perform with minimal assit - RUE needing more MOd assist.   Shoulder Instructions       General Comments      Pertinent Vitals/ Pain       Pain Assessment: Faces Faces Pain Scale: Hurts whole lot Breathing: normal Negative Vocalization: occasional  moan/groan, low speech, negative/disapproving quality Facial Expression: sad, frightened, frown Body Language: tense, distressed pacing, fidgeting Consolability: distracted or reassured by voice/touch PAINAD Score: 4 Pain Location: BLEs though > in LLE > in lower leg Pain Descriptors / Indicators: Guarding;Grimacing;Moaning Pain Intervention(s): Limited activity within patient's tolerance;Monitored during session  Home Living                                          Prior Functioning/Environment              Frequency  Min 2X/week        Progress Toward Goals  OT Goals(current goals can now be found in the care plan section)  Progress towards OT goals: Progressing toward goals  Acute Rehab OT Goals OT Goal Formulation: Patient unable to participate in goal setting Time For Goal Achievement: 01/07/22 Potential to Achieve Goals: Poor  Plan Discharge plan remains appropriate    Co-evaluation    PT/OT/SLP Co-Evaluation/Treatment: Yes Reason for Co-Treatment: Complexity of the  patient's impairments (multi-system involvement)   OT goals addressed during session: Strengthening/ROM      AM-PAC OT "6 Clicks" Daily Activity     Outcome Measure   Help from another person eating meals?: A Lot Help from another person taking care of personal grooming?: A Lot Help from another person toileting, which includes using toliet, bedpan, or urinal?: Total Help from another person bathing (including washing, rinsing, drying)?: Total Help from another person to put on and taking off regular upper body clothing?: Total Help from another person to put on and taking off regular lower body clothing?: Total 6 Click Score: 8    End of Session    OT Visit Diagnosis: Unsteadiness on feet (R26.81);Other symptoms and signs involving cognitive function;Pain;History of falling (Z91.81);Repeated falls (R29.6);Muscle weakness (generalized) (M62.81) Pain - Right/Left:  Left Pain - part of body: Leg   Activity Tolerance Patient tolerated treatment well   Patient Left in bed;with bed alarm set;with nursing/sitter in room   Nurse Communication  (near impairments)        Time: 7225-7505 OT Time Calculation (min): 32 min  Charges: OT General Charges $OT Visit: 1 Visit OT Treatments $Self Care/Home Management : 8-22 mins  Derl Barrow, OTR/L [place]  Office [phone] Pager: [place] 12/27/2021, 4:01 PM

## 2021-12-28 ENCOUNTER — Encounter (HOSPITAL_COMMUNITY): Payer: Self-pay

## 2021-12-28 DIAGNOSIS — R410 Disorientation, unspecified: Secondary | ICD-10-CM | POA: Diagnosis not present

## 2021-12-28 DIAGNOSIS — R413 Other amnesia: Secondary | ICD-10-CM | POA: Diagnosis not present

## 2021-12-28 DIAGNOSIS — G9341 Metabolic encephalopathy: Secondary | ICD-10-CM | POA: Diagnosis not present

## 2021-12-28 DIAGNOSIS — R2990 Unspecified symptoms and signs involving the nervous system: Secondary | ICD-10-CM

## 2021-12-28 LAB — BASIC METABOLIC PANEL
Anion gap: 5 (ref 5–15)
BUN: 21 mg/dL (ref 8–23)
CO2: 28 mmol/L (ref 22–32)
Calcium: 10.1 mg/dL (ref 8.9–10.3)
Chloride: 106 mmol/L (ref 98–111)
Creatinine, Ser: 0.37 mg/dL — ABNORMAL LOW (ref 0.44–1.00)
GFR, Estimated: >60 mL/min (ref >60)
Glucose, Bld: 144 mg/dL — ABNORMAL HIGH (ref 70–99)
Potassium: 3.1 mmol/L — ABNORMAL LOW (ref 3.5–5.1)
Sodium: 139 mmol/L (ref 135–145)

## 2021-12-28 LAB — MAGNESIUM: Magnesium: 1.9 mg/dL (ref 1.7–2.4)

## 2021-12-28 LAB — PHOSPHORUS: Phosphorus: 2.7 mg/dL (ref 2.5–4.6)

## 2021-12-28 LAB — GLUCOSE, CAPILLARY
Glucose-Capillary: 138 mg/dL — ABNORMAL HIGH (ref 70–99)
Glucose-Capillary: 166 mg/dL — ABNORMAL HIGH (ref 70–99)
Glucose-Capillary: 123 mg/dL — ABNORMAL HIGH (ref 70–99)
Glucose-Capillary: 123 mg/dL — ABNORMAL HIGH (ref 70–99)
Glucose-Capillary: 133 mg/dL — ABNORMAL HIGH (ref 70–99)

## 2021-12-28 MED ORDER — TRAVASOL 10 % IV SOLN
INTRAVENOUS | Status: AC
  Filled 2021-12-28: qty 540

## 2021-12-28 MED ORDER — MAGNESIUM SULFATE 2 GM/50ML IV SOLN
2.0000 g | Freq: Once | INTRAVENOUS | Status: AC
  Administered 2021-12-28: 2 g via INTRAVENOUS
  Filled 2021-12-28: qty 50

## 2021-12-28 MED ORDER — POTASSIUM CHLORIDE 10 MEQ/100ML IV SOLN
10.0000 meq | INTRAVENOUS | Status: AC
  Administered 2021-12-28 (×4): 10 meq via INTRAVENOUS
  Filled 2021-12-28 (×4): qty 100

## 2021-12-28 NOTE — Plan of Care (Signed)
  Problem: Nutrition: Goal: Adequate nutrition will be maintained Outcome: Progressing   Problem: Pain Managment: Goal: General experience of comfort will improve Outcome: Progressing   

## 2021-12-28 NOTE — Assessment & Plan Note (Addendum)
Exam difficult, inconsistent MRI c spine recommended 12/21, MRI c spine with C6-7 severe bilateral neural foraminal narrowing, C5-6 severe L and mild R enural foraminal narrowing, C3-4 and C4-5 mild to moderate L neural foraminal narrowing MRI brain without acute abnormality MRI T and L spine done as well without findings that I think would explain her weakness, reviewed with neuro Copper wnl, vitamin E (wnl), vitamin b1 (elevated), and vitamin b6 (low).  Vitamin D wnl. Continue folate and niacin supplementation per neuro EEG with generalized slowing EMG/NCS will need to be outpatient Discuss with neuro again as needed

## 2021-12-28 NOTE — Assessment & Plan Note (Addendum)
According to the chart mild prior to admission, patient was independent and still working at times

## 2021-12-28 NOTE — Assessment & Plan Note (Deleted)
--   Continue delirium precautions, including natural light during the day, minimization of noise, TV off or on soothing music, lights on during the day, off at night, minimize disturbances of sleep at night, mobilize as much as possible, close attention to bowel and bladder function.  Hearing aids or glasses needed. --Do not see any extra-abdominal etiologies for this state. -- No apparent change

## 2021-12-28 NOTE — Assessment & Plan Note (Addendum)
--  per surgery S/p ex lap, extended right colectomy, end ileostomy, mucous fistula 11/17/2021 CT 2/1 with near complete resolution of heterogenous fat stranding and fluid in low right hemiabdomen with residual tubular and bandlike elements in vicinity (c/w resolving post op hematoma/seroma with fat necrosis)

## 2021-12-28 NOTE — Progress Notes (Signed)
[facility] Progress Note  41 Days Post-Op  Subjective: No complaints today.  Just chewed up her iron pill so her mouth is black.  Objective: Vital signs in last 24 hours: Temp:  [97.5 F (36.4 C)-98 F (36.7 C)] 97.7 F (36.5 C) (01/07 0553) Pulse Rate:  [82-103] 95 (01/07 0553) Resp:  [16-18] 18 (01/07 0553) BP: (106-139)/(64-93) 139/93 (01/07 0553) SpO2:  [92 %-100 %] 100 % (01/07 0553) Last BM Date: 12/27/21  Intake/Output from previous day: 01/06 0701 - 01/07 0700 In: 2435.9 [P.O.:1200; I.V.:980.6; IV Piggyback:255.3] Out: 650 [Stool:650] Intake/Output this shift: No intake/output data recorded.  PE: General: resting comfortably, NAD Resp: normal work of breathing Abdomen: soft, nondistended, nontender to palpation. Midline incision with dressing in place and purulent drainage noted. Ileostomy productive of loose stool. 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 Extremities: warm and well-perfused Neuro: FC, no focal deficit laying in bed  Lab Results:  BMET Recent Labs    12/27/21 0340 12/28/21 0221  NA 140 139  K 3.5 3.1*  CL 106 106  CO2 28 28  GLUCOSE 157* 144*  BUN 25* 21  CREATININE 0.36* 0.37*  CALCIUM 9.9 10.1   PT/INR No results for input(s): LABPROT, INR in the last 72 hours. CMP     Component Value Date/Time   NA 139 12/28/2021 0221   K 3.1 (L) 12/28/2021 0221   CL 106 12/28/2021 0221   CO2 28 12/28/2021 0221   GLUCOSE 144 (H) 12/28/2021 0221   BUN 21 12/28/2021 0221   CREATININE 0.37 (L) 12/28/2021 0221   CALCIUM 10.1 12/28/2021 0221   PROT 7.4 12/26/2021 0348   ALBUMIN 2.0 (L) 12/26/2021 0348   AST 16 12/26/2021 0348   ALT 15 12/26/2021 0348   ALKPHOS 82 12/26/2021 0348   BILITOT 0.5 12/26/2021 0348   GFRNONAA >60 12/28/2021 0221   Lipase     Component Value Date/Time   LIPASE 22 11/17/2021 0955   Anti-infectives: Anti-infectives (From admission, onward)    Start     Dose/Rate Route Frequency Ordered Stop    11/25/21 1000  piperacillin-tazobactam (ZOSYN) IVPB 3.375 g  Status:  Discontinued        3.375 g 12.5 mL/hr over 240 Minutes Intravenous Every 8 hours 11/25/21 0909 12/01/21 0822   11/17/21 1830  piperacillin-tazobactam (ZOSYN) IVPB 3.375 g        3.375 g 12.5 mL/hr over 240 Minutes Intravenous Every 8 hours 11/17/21 1415 11/22/21 2359   11/17/21 1230  piperacillin-tazobactam (ZOSYN) IVPB 3.375 g        3.375 g 100 mL/hr over 30 Minutes Intravenous  Once 11/17/21 1220 11/17/21 1304        Assessment/Plan  s/p Procedure(s): EXPLORATORY LAPAROTOMY, EXTENDED RIGHT COLECTOMY, END ILEOSTOMY, MUCOUS FISTULA (N/A) PARTIAL COLECTOMY (N/A) 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 Harlow Asa 11/27 - path: necrotic colon at site of perforation, no malignancy or dysplasia noted  - Recent CT 1/1 with no new drainable collections. Small amount of fluid deep to incision, which is draining via inferior aspect of incision. - Restarted abx 12/5. WBC normalized. Abx stopped. - Minimal oral intake. Was on cortrak + TF 12/7-8. Cortrak became clogged and patient refused replacement 12/8. - Ileostomy output improved. Continue BID metamucil.  - WOC following for ileostomy, mucous fistula;  - TID saline WTD dressing changes to midline incision - Pathology benign. CT distal descending/proximal sigmoid bowel luminal collapse.  FEN - Dysphagia 2 diet as tolerated (brother reports patient has difficulty chewing anything that is not soft or pureed).  Protein supplements per dietitian. G tube may be necessary to get adequate nutrition.  Awaiting decision on G-tune from brother.  No at bedside today.  VTE - SCDs, Lovenox ID - [email or web address] 11/27 - 12/2. 12/5-12/11 Foley - Purewick was removed due to skin breakdown of the inner thigh.  We need to continue attempts to get the patient up out of bed, including using the bedside commode, as  much as possible.   Encephalopathy/FTT - Patient has been on folic acid and thiamine supplements. Noted to have poor motor control and coordination the other day by PT which is different from last week per therapy notes, Neurology consulted - appreciated their input. No further workup needed at this time from their standpoint.   - psych saw 12/27 and determined patient does not have capacity.    Dispo: Patient will need SNF placement at discharge. Appreciate case management sending documents to [facility] in [place].  We will continue to work on disposition.  We will follow-up with family today regarding placement of gastrostomy tube.   LOS: 76 days   Sherry Hampton, [facility] Please see Amion for pager number during day hours 7:00am-4:30pm

## 2021-12-28 NOTE — Progress Notes (Signed)
PHARMACY - TOTAL PARENTERAL NUTRITION CONSULT NOTE   Indication:  Inability to meet enteral needs  Patient Measurements: Height: 5\' 10"  (177.8 cm) Weight: 77.9 kg (171 lb 11.8 oz) IBW/kg (Calculated) : 68.5 TPN AdjBW (KG): 79.8 Body mass index is 24.64 kg/m. Usual Weight: 80 kg  Assessment: 66 y.o. 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 She is now extended right colectomy and ileostomy. She developed acute metabolic encephalopathy post-op. She was initiated on TF, but tube became clogged on 12/8 and patient refused replacement, and is not eating enough to maintain nutrition.  Calorie count was extended to end 1/5 PM as patient did not consume 3 full meals on 1/4.  CCS discussing possibility of G tube with family. CCS ordered TPN to start on 12/12, pharmacy to manage.  Glucose / Insulin: No hx DM.   CBGs < 160 2 unit of insulin used  in past 24 hours  Electrolytes:  Potassium low at 3.1, magnesium is borderline low at 1.9, Other electrolytes   wnl except for CoCa 11.5 (improving - no Ca in TPN)   Renal: SCr <1 stable, BUN WNL Hepatic: LFTs WNL, Albumin low Trig: WNL (12/26, 1/2) Intake / Output: + 1200 mL - UOP - x8 documented but not quantified, stool output: 650 ml MIVF: None GI Imaging:  - 12/27 abd CT: Postsurgical changes with a right lower quadrant ileostomy and left colostomy. No bowel obstruction. 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 Interval decrease in the size of the fluid density inferior to the gallbladder. No drainable fluid collection Identified. Complex collection in the deep subcutaneous soft tissues of theanterior abdominal wall along the midline extending to the skin at the level of the umbilicus GI  Surgeries / Procedures:  11/27: Expiratory laparotomy, extended right colectomy, end ileostomy  Central access: PICC placed 12/11 TPN start date: 12/12  Nutritional Goals: RD Assessment: Estimated Needs Total Energy Estimated Needs: 1950-2150 Total Protein Estimated Needs: 95-105g Total Fluid Estimated Needs: 2L/day  Current Nutrition:  Regular diet- Minimal intake, patient noted to be pocketing food and will spit out Ensure Enlive TID (20 g of protein and 350 kcal) *Intake not consistent Juven supplement packet (2.5g of protein and 95 kcal) *Intake not consistent Half rate cyclic TPN - 4,098 mL provides 54g protein and 1094 kcal per day. Attempting to stimulate appetite during the day.    Plan:  Now: Potassium chloride 10 meq IV x 4  Magnesium sulfate 2 gr IV x 1   At 11:91:  Continue cyclic TPN giving ~47% of nutritional needs. Infuse 1,000 mL over 14 hrs: 38 mL/hr x 1 hr, then 77 mL/hr x 12 hrs, then 38 mL/hr x 1 hr. Electrolytes in TPN. Na 34mEq/L Inc K 70 mEq/L Ca 14mEq/L Inc Mg 7 mEq/L Phos 20 mmol/L Cl:Ac max Ac SSI sensitive TID and adjust as needed Continue multivitamin PO Thiamine per neurology BMP, magnesium, phosphorus with AM labs  Monitor TPN labs on Mon/Thurs Follow up calorie counts and/or PEG tube discussions   Dimple Nanas, PharmD 12/28/2021 7:26 AM

## 2021-12-28 NOTE — Assessment & Plan Note (Addendum)
Continue nutrition as per surgery

## 2021-12-28 NOTE — Progress Notes (Addendum)
Consultation Progress Note   Patient: Sherry Hampton ZOX:[id number] DOB: 04-Feb-1956 DOA: 11/17/2021 DOS: the patient was seen and examined on 12/28/2021 Primary/requesting service: Edison Pace Md, MD Reason for consultation: Medical evaluation and management.  Brief hospital course: 66 year old woman according to chart was independent, living alone and driving and continue to work as needed.  PMH including hyperlipidemia, possibly some memory loss, presented 11/27 with sudden abdominal pain.  Underwent emergent surgery for perforated viscus of the ascending colon with fecal peritonitis same day.  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  Hospitalization has been complicated by fluid collections in the abdomen, malnutrition, currently on TNA, intermittent encephalopathy and failure to thrive and progressive decline and in strength and ability to participate with physical therapy.  She was seen by my partner Dr. Grandville Silos December 7 through December 12 for evaluation of acute metabolic encephalopathy.  During that time.  She improved clinically daily and was less confused, following commands appropriately and answering questions appropriately.  No focal neurologic deficits were noted during that time..  Laboratory work-up including ammonia, TSH, HIV were all unremarkable.  Given her clinical improvement during that time.  Medicine signed off.  She was seen by neurology 12/20 for ataxia, not progressing with mobility, difficulty with coordination, impaired gross motor control, ataxia.  She was found to be confused and recommendations included MRI, thiamine supplementation.  Seem to have proprioceptive loss rather than weakness.  MRI of the brain was unremarkable, cervical spine MRI was ordered but patient declined nurse.  Delirium was postulated secondary to prolonged hospitalization, sleep-wake disturbance, nutritional deficiencies and  prior critical illness.  Per notes, examination without obvious ataxia.  Recommendations were to continue vitamin supplements, minimize deliriogenic medications and maximize environmental support.  Seen by psychiatry 12/27 to assess capacity, per note brother had reported the patient had memory issues slowly and gradually declining but still living by herself.  Assessment was that the patient was confused, delirious and lacked capacity to participate in treatment plan.  Recommendation was to treat delirium.  I was asked to see the patient today from a medical perspective, specifically in regard to PT evaluation 1/6 which noted the patient was initially able to stand and walk 11/28 180 feet, but is now "limited by severe profound weakness and new neurologic symptoms".  No details positive UM and testing clonus right lower extremity and upper extremity at ankle and wrist, hypertonicity hamstring biceps and triceps and impaired sensation to light touch throughout all extremities with no proprioceptive awareness.  Significant atrophy of lower extremity musculature was noted.  UMN signs were felt to be inconsistent with generalized weakness from limited activity.  PT was concerned and suggested further investigation and neurologic work-up.  All history obtained from the chart.  Patient question at bedside but unable to provide any answers other than her name, review of systems unreliable and no history could be obtained.  Assessment and Plan: * Perforated abdominal viscus- (present on admission) --per surgery  Delirium -- Recommend delirium precautions, will order, including natural light during the day, minimization of noise, TV off or on soothing music, lights on during the day, off at night, minimize disturbances of sleep at night, mobilize as much as possible, close attention to bowel and bladder function.  Hearing aids or glasses needed. --Do not see any extra abdominal etiologies for this  state.  Acute metabolic encephalopathy- (present on admission) -- Odd affect, oriented to name only, does not  follow commands, not able to participate in examination or history. -- Previous investigation thorough and unrevealing as was MRI of the brain.  Chemistry panel unremarkable and TSH within normal limits. -- Multiple urinalyses unremarkable within the last few weeks.  Mild white count of unclear significance.  No fever.  No signs or symptoms to suggest extra-abdominal infection.  Defer interpretation of clinical exam of the abdomen and radiological studies of the abdomen to surgery. -- Agree with previous work-up.  Etiology of this is unclear, apparently the patient had some memory loss but was still independent at home.  No additional studies suggested at the moment, will review further and reexamine tomorrow.  Abnormal neurological exam -- Exam difficult, patient does not comply with examination, does not seem to be able to.  She has pain with movement of her left leg which may be stiffness.  Passive bending of the right knee is accomplished but although she has tone of her extremities, she does not comply with examination or lift them.  Grip strength is weak bilaterally and patient does participate with, but will not lift her arms. -- Given therapy concerns for you and signs, clonus, profound weakness and new neurologic symptoms, I would recommend neurology consultation 1/8 for further recommendations.  Etiology of her functional change not clear to me at this time.  Protein-calorie malnutrition, severe -- Continue nutrition as per surgery  Memory loss -- According to the chart mild prior to admission, patient was independent and still working at times   We will follow with you, provide any further recommendations as able.  Presently I do not see any general medical issues likely to be causing condition.  Previous work-up extensive and unrevealing.  Recommend consideration be given to   neurology consultation.  PMH includes anemia, hyperlipidemia PSH includes hemorrhoid banding, laparotomy and partial colectomy Allergies: None Family history Father with colon polyps Non-smoker nondrinker no alcohol  Subjective:  Does not respond appropriately to most questions.  Seems to feel fine.  Objective: Vitals:   12/27/21 1320 12/27/21 2245 12/28/21 0553 12/28/21 1308  BP: 106/73 113/64 (!) 139/93 127/80  Pulse: (!) 103 82 95 98  Resp: 18 16 18 18   Temp: 98 F (36.7 C) (!) 97.5 F (36.4 C) 97.7 F (36.5 C) 98.1 F (36.7 C)  TempSrc:  Oral Oral   SpO2: 92% 99% 100% 99%  Weight:      Height:       Vital signs were reviewed and unremarkable. Physical Exam Vitals reviewed.  Constitutional:      General: She is not in acute distress.    Appearance: She is ill-appearing. She is not toxic-appearing.  HENT:     Mouth/Throat:     Comments: Will not open mouth, says "no" when asked Cardiovascular:     Rate and Rhythm: Normal rate and regular rhythm.     Heart sounds: No murmur heard. Pulmonary:     Effort: Pulmonary effort is normal. No respiratory distress.     Breath sounds: No wheezing, rhonchi or rales.  Abdominal:     Palpations: Abdomen is soft.     Comments: Ostomies noted  Musculoskeletal:     Right lower leg: No edema.     Left lower leg: No edema.     Comments: Tone difficult to gauge in the upper extremities, does weakly squeeze with both hands to command.  Does not lift arms.  Lower extremity tone and seems to be better, but cries out in pain when the  left leg was slightly lifted, able to passively bend right knee, but does not actively move lower extremities.  Neurological:     Mental Status: She is alert.     Comments: Not able to gauge sensation as the patient does not follow commands or answer most questions appropriately.  She is oriented to self only.  Her face does appear symmetric.  Psychiatric:     Comments: Odd affect, cannot assess mood.   Behavior is strange.     Data Reviewed:  CBG stable Potassium 3.1, remainder BMP unremarkable, magnesium and phosphorus within normal limits.  B12 was within normal limits, as was TSH, ammonia. HIV nonreactive.  Family Communication: none presnt   Time spent: 60 minutes.  Author: Murray Hodgkins, MD 12/28/2021 8:24 PM  For on call review [email or web address].

## 2021-12-28 NOTE — Assessment & Plan Note (Addendum)
alert, but obviously confused - stable, not changing meaningfully See 12/28/2021 initial consult note [name]) for review of the chart and extensive work-up. Previously with normal ammonia, TSH, HIV S/p high dose thiamine, continue supplemenation MRI brain 12/21 without acute abnormality Repeat MRI brain 1/15 without acute findings EEG generalized slowing c/w encephalopathy MRI C spine with C6-7 severe bilateral neural foraminal narrowing, C5-6 severe L and mild R neural foraminal narrowing, C3-4 and C4-5 mild to moderate L neural foraminal narrowing Neurology evaluated and delirium thought 2/2 prolonged hospitalization, sleep wake disturbance, nutritional def, critical illness Continued encephalopathy multifactorial, related to infection (above - now on abx), prolonged hospitalization, nutritional def, critical illness --- follow with antibiotics, nutrition, etc B6 and folic acid supplementation Delirium precautions We haven't checked cortisol yet, will follow this tmrw AM

## 2021-12-29 DIAGNOSIS — G9341 Metabolic encephalopathy: Secondary | ICD-10-CM | POA: Diagnosis not present

## 2021-12-29 DIAGNOSIS — R410 Disorientation, unspecified: Secondary | ICD-10-CM | POA: Diagnosis not present

## 2021-12-29 LAB — GLUCOSE, CAPILLARY
Glucose-Capillary: 136 mg/dL — ABNORMAL HIGH (ref 70–99)
Glucose-Capillary: 157 mg/dL — ABNORMAL HIGH (ref 70–99)
Glucose-Capillary: 121 mg/dL — ABNORMAL HIGH (ref 70–99)
Glucose-Capillary: 103 mg/dL — ABNORMAL HIGH (ref 70–99)
Glucose-Capillary: 119 mg/dL — ABNORMAL HIGH (ref 70–99)

## 2021-12-29 LAB — BASIC METABOLIC PANEL
Anion gap: 4 — ABNORMAL LOW (ref 5–15)
BUN: 23 mg/dL (ref 8–23)
CO2: 28 mmol/L (ref 22–32)
Calcium: 10.3 mg/dL (ref 8.9–10.3)
Chloride: 109 mmol/L (ref 98–111)
Creatinine, Ser: 0.38 mg/dL — ABNORMAL LOW (ref 0.44–1.00)
GFR, Estimated: >60 mL/min (ref >60)
Glucose, Bld: 141 mg/dL — ABNORMAL HIGH (ref 70–99)
Potassium: 4.2 mmol/L (ref 3.5–5.1)
Sodium: 141 mmol/L (ref 135–145)

## 2021-12-29 LAB — MAGNESIUM: Magnesium: 2.2 mg/dL (ref 1.7–2.4)

## 2021-12-29 LAB — PHOSPHORUS: Phosphorus: 3 mg/dL (ref 2.5–4.6)

## 2021-12-29 MED ORDER — TRAVASOL 10 % IV SOLN
INTRAVENOUS | Status: AC
  Filled 2021-12-29: qty 540

## 2021-12-29 NOTE — Progress Notes (Signed)
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 Harlow Asa 11/27 - path: necrotic colon at site of perforation, no malignancy or dysplasia noted  - Ileostomy output improved. Continue BID metamucil.  - WOC following for ileostomy, mucous fistula - TID saline WTD dressing changes to midline incision - Pathology benign. CT distal descending/proximal sigmoid bowel luminal collapse.   FEN - Dysphagia 2 diet as tolerated (brother reports patient has difficulty chewing anything that is not soft or pureed).  Protein supplements per dietitian. G tube may be necessary to get adequate nutrition.  Awaiting decision on G-tube from brother.  No family at bedside today.   VTE - SCDs, Lovenox ID - [email or web address] 11/27 - 12/2. 12/5-12/11 Foley - Purewick prn   - encephalopathy/FTT - see note yesterday from Dr. [name] - appreciate consultation at request of Dr. Johnnye Sima.  - psych saw 12/27 and determined patient does not have capacity   Dispo: Patient will need SNF placement at discharge. Appreciate case management sending documents to [facility] in [place].  We will continue to work on disposition with case management and family.  Family to discuss placement of gastrostomy tube.        [name], MD       [facility]       Office: [phone]   Chief Complaint: Perforated colon with fecal peritonitis  Subjective: Patient in bed, responsive to voice, pleasant.  Objective: Vital signs in last 24 hours: Temp:  [97.6 F (36.4 C)-98.3 F (36.8 C)] 98.3 F (36.8 C) (01/08 0612) Pulse Rate:  [90-98] 90 (01/08 0612) Resp:  [16-18] 16 (01/08 0612) BP: (103-127)/(63-80) 124/70 (01/08 0612) SpO2:  [98 %-100 %] 100 % (01/08 0612) Last BM Date: 12/28/21  Intake/Output from previous day: 01/07 0701 - 01/08 0700 In: 1598 [P.O.:717; I.V.:881] Out: 383 [Urine:3; Stool:380] Intake/Output this  shift: Total I/O In: 300 [P.O.:300] Out: 50 [Stool:50]  Physical Exam: HEENT - sclerae clear, mucous membranes moist Neck - soft Abdomen - soft, midline dressing dry and intact; small succus in ileostomy bag; no output from mucous fistula Ext - no edema, non-tender  Lab Results:  No results for input(s): WBC, HGB, HCT, PLT in the last 72 hours. BMET Recent Labs    12/28/21 0221 12/29/21 0630  NA 139 141  K 3.1* 4.2  CL 106 109  CO2 28 28  GLUCOSE 144* 141*  BUN 21 23  CREATININE 0.37* 0.38*  CALCIUM 10.1 10.3   PT/INR No results for input(s): LABPROT, INR in the last 72 hours. Comprehensive Metabolic Panel:    Component Value Date/Time   NA 141 12/29/2021 0630   NA 139 12/28/2021 0221   K 4.2 12/29/2021 0630   K 3.1 (L) 12/28/2021 0221   CL 109 12/29/2021 0630   CL 106 12/28/2021 0221   CO2 28 12/29/2021 0630   CO2 28 12/28/2021 0221   BUN 23 12/29/2021 0630   BUN 21 12/28/2021 0221   CREATININE 0.38 (L) 12/29/2021 0630   CREATININE 0.37 (L) 12/28/2021 0221   GLUCOSE 141 (H) 12/29/2021 0630   GLUCOSE 144 (H) 12/28/2021 0221   CALCIUM 10.3 12/29/2021 0630   CALCIUM 10.1 12/28/2021 0221   AST 16 12/26/2021 0348   AST 17 12/23/2021 0512   ALT 15 12/26/2021 0348   ALT 14 12/23/2021 0512   ALKPHOS 82 12/26/2021 0348   ALKPHOS 81 12/23/2021 0512   BILITOT 0.5  12/26/2021 0348   BILITOT 0.3 12/23/2021 0512   PROT 7.4 12/26/2021 0348   PROT 7.6 12/23/2021 0512   ALBUMIN 2.0 (L) 12/26/2021 0348   ALBUMIN 2.0 (L) 12/23/2021 0512    Studies/Results: No results found.    [name] 12/29/2021   Patient ID: Sherry Hampton, female   DOB: 14-Apr-1956, 66 y.o.   MRN: [id number]

## 2021-12-29 NOTE — Progress Notes (Signed)
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 Harlow Asa 11/27.  Per recent MD notes: "Encephalopathy/FTT - Patient has been on folic acid and thiamine supplements. Noted to have poor motor control and coordination the other day by PT which is different from last week per therapy notes, Neurology consulted - appreciated their input. No further workup needed at this time from their standpoint.    - psych saw 12/27 and determined patient does not have capacity. Patient has been worsening since admission.  She was confused from POD 1 on. Question if she was like this prior to admission, but lived alone and no one aware of these issues."   OT comments  Patient was noted to have continued clonus movements in RUE on this date. Patient was able to engage in participation in washing face with LUE with ability to reach chin and mouth with increased time to process task. Patient was unable to follow one step commands consistently during session. Patient participated in [place] to maintain ROM and ability to engage in tasks. Patient was noted to have grimacing with attempts to move R shoulder but would report no pain. No redness, edema or heat noted on shoulder at this time. Patient would continue to benefit from skilled OT services at this time while admitted and after d/c to address noted deficits in order to improve overall safety and independence in ADLs.     Recommendations for follow up therapy are one component of a multi-disciplinary discharge planning process, led by the attending physician.  Recommendations may be updated based on patient status, additional functional criteria and insurance authorization.    Follow Up Recommendations  Skilled  nursing-short term rehab (<3 hours/day)    Assistance Recommended at Discharge Frequent or constant Supervision/Assistance  Patient can return home with the following  A lot of help with bathing/dressing/bathroom;Two people to help with bathing/dressing/bathroom;Two people to help with walking and/or transfers;A lot of help with walking and/or transfers;Assistance with cooking/housework   Equipment Recommendations  None recommended by OT    Recommendations for Other Services      Precautions / Restrictions Precautions Precautions: Fall Precaution Comments: abdominal surgery, RUQ ileostomy Restrictions Weight Bearing Restrictions: No       Mobility Bed Mobility                    Transfers                         Balance                                           ADL either performed or assessed with clinical judgement   ADL       Grooming: Moderate assistance Grooming Details (indicate cue type and reason): patient was mod A for washing face with warm wash cloth. patient needed AAROM for lifting LUE up to reach eyes and forehead with increased time for patient to attempt prior to assist provided. patient was abl to reach chin and mouth afterset up with MI.  Extremity/Trunk Assessment Upper Extremity Assessment RUE Deficits / Details: clonus noted with wrist extension, supination, with active assist for all movements. patient is able to twitch digits for squeeze like movement on this date in gravity eliminated environment.attempted to range shoulder with patient grimancing with pain but verbally denies pain at this time. RUE Sensation:  (remains difficult to assess at this time with patients cognitive status.) RUE Coordination: decreased fine motor;decreased gross motor LUE Deficits / Details: patient was able to tolerate AAROM to about 80 degrees with patient able to activate posterior  deltoids and pull arm back to side when stretched out on bed.no pain noted with AAROM. patient able to squeeze hand with 3+/5 and bend elbow with AROM to wipe chin. unable to complete when asked to bend elbow without purpose. LUE Coordination: decreased gross motor;decreased fine motor            Vision       Perception     Praxis      Cognition Arousal/Alertness: Awake/alert Behavior During Therapy: Flat affect;Anxious Overall Cognitive Status: Impaired/Different from baseline                                 General Comments: patient noted to need increased time to process all cues. patient was more conversive today but remains confused. patient mentioned a little girl was in room earlier on this date. patient has a hard time with following and motor planning cues. able to demonstrate during some tasks but harder in others.          Exercises Other Exercises Other Exercises: attempted to have patient pull arm back to side x 5 reps with increased time to proces stask. Other Exercises: patient particiapted in [place] with mod A needed distally and max A needed proximaly with pain in shoulder limiting participation. appears to be seate din glenohumeral joint appropirately with no edema or redness noted. Other Exercises: patient participated in [place] with patient able to bring hand to chin x 8 reps with increased time. Other Exercises: attempted to participate in motor planning to reach for items with AAROM with patient having difficulty with attention to task. patient would turn head to therapist and look at therapist v.s. look at item and attempt to reach for it.   Shoulder Instructions       General Comments      Pertinent Vitals/ Pain       Pain Assessment: Faces Faces Pain Scale: Hurts even more Pain Location: R shouler with PROM attempts Pain Descriptors / Indicators: Grimacing;Discomfort;Guarding Pain Intervention(s): Limited activity within  patient's tolerance;Monitored during session  Home Living                                          Prior Functioning/Environment              Frequency  Min 2X/week        Progress Toward Goals  OT Goals(current goals can now be found in the care plan section)  Progress towards OT goals: OT to reassess next treatment     Plan Discharge plan remains appropriate    Co-evaluation                 AM-PAC OT "6 Clicks" Daily Activity     Outcome Measure  Help from another person eating meals?: A Lot Help from another person taking care of personal grooming?: A Lot Help from another person toileting, which includes using toliet, bedpan, or urinal?: Total Help from another person bathing (including washing, rinsing, drying)?: Total Help from another person to put on and taking off regular upper body clothing?: Total Help from another person to put on and taking off regular lower body clothing?: Total 6 Click Score: 8    End of Session    OT Visit Diagnosis: Unsteadiness on feet (R26.81);Other symptoms and signs involving cognitive function;Pain;History of falling (Z91.81);Repeated falls (R29.6);Muscle weakness (generalized) (M62.81) Pain - Right/Left: Right Pain - part of body: Shoulder   Activity Tolerance Patient tolerated treatment well   Patient Left in bed;with bed alarm set;with nursing/sitter in room   Nurse Communication Mobility status        Time: 4492-0100 OT Time Calculation (min): 28 min  Charges: OT General Charges $OT Visit: 1 Visit OT Treatments $Therapeutic Activity: 23-37 mins  Jackelyn Poling OTR/L, MS Acute Rehabilitation Department Office# [phone] Pager# [phone]   Marcellina Millin 12/29/2021, 12:27 PM

## 2021-12-29 NOTE — Progress Notes (Addendum)
°  Consult Progress Note   Patient: Sherry Hampton KYH:[id number] DOB: [birth year] DOA: 11/17/2021     42 DOS: the patient was seen and examined on 12/29/2021 Primary Service: General surgery   Brief hospital course: 66 year old woman according to chart was independent, living alone and driving and continue to work as needed.  PMH including hyperlipidemia, possibly some memory loss, presented 11/27 with sudden abdominal pain.  Underwent emergent surgery for perforated viscus of the ascending colon with fecal peritonitis same day.  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  Hospitalization has been complicated by fluid collections in the abdomen, malnutrition, currently on TNA, intermittent encephalopathy and failure to thrive and progressive decline in strength and ability to participate with physical therapy.  Assessment and Plan * Perforated abdominal viscus- (present on admission) --per surgery  Delirium -- Continue delirium precautions, including natural light during the day, minimization of noise, TV off or on soothing music, lights on during the day, off at night, minimize disturbances of sleep at night, mobilize as much as possible, close attention to bowel and bladder function.  Hearing aids or glasses needed. --Do not see any extra-abdominal etiologies for this state.  Acute metabolic encephalopathy- (present on admission) -- A little more alert today, oriented to name only, grips hands to command otherwise does not follow commands.  Affect remains odd.   --  Etiology of this is unclear, apparently the patient had some memory loss but was still independent at home.  No additional studies suggested today.  Discussed with Dr. Harlow Asa.  Recommend neurology consultation.  Abnormal neurological exam -- Exam remains difficult, patient does not comply with examination, does not seem to be able to. -- Given therapy  concerns for UMN signs, clonus, profound weakness and new neurologic symptoms, I recommend neurology consultation, discussed w/ Dr. Harlow Asa. Etiology of her functional change not clear to me at this time.  Protein-calorie malnutrition, severe -- Continue nutrition as per surgery  Memory loss -- According to the chart mild prior to admission, patient was independent and still working at times    Will check on tomorrow  Subjective:  Feels ok No complaints History not reliable  Objective Vital signs were reviewed and unremarkable. Physical Exam Constitutional:      General: She is not in acute distress.    Appearance: She is not ill-appearing or toxic-appearing.  Cardiovascular:     Rate and Rhythm: Normal rate and regular rhythm.     Heart sounds: No murmur heard.   No gallop.  Pulmonary:     Effort: Pulmonary effort is normal. No respiratory distress.     Breath sounds: No wheezing, rhonchi or rales.  Neurological:     Mental Status: She is alert.     Comments: Confused, oriented to self only, does not follow commands Tone upper and lower extremities difficult  She does grip weakly bilaterally to command Able to hold arm in air once raised by myself      Data Reviewed:  BMP stable       Time spent: 25 minutes  Author: Murray Hodgkins, MD 12/29/2021 3:09 PM  For on call review [email or web address].

## 2021-12-29 NOTE — Progress Notes (Signed)
Chaplain followed up with patient who had been asleep the previous day when chaplain came to see her.  She was very grateful for the visit and shared that it has been so hard to be away from family.  It was difficult to understand her words because her voice was very soft, but connection was maintained.  Chaplain provided her with presence, prayer and comfort.  [place], [place] Pager, [phone] 11:12 PM

## 2021-12-29 NOTE — Progress Notes (Signed)
PHARMACY - TOTAL PARENTERAL NUTRITION CONSULT NOTE   Indication:  Inability to meet enteral needs  Patient Measurements: Height: 5\' 10"  (177.8 cm) Weight: 77.9 kg (171 lb 11.8 oz) IBW/kg (Calculated) : 68.5 TPN AdjBW (KG): 79.8 Body mass index is 24.64 kg/m. Usual Weight: 80 kg  Assessment: 66 y.o. 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 She is now extended right colectomy and ileostomy. She developed acute metabolic encephalopathy post-op. She was initiated on TF, but tube became clogged on 12/8 and patient refused replacement, and is not eating enough to maintain nutrition.  Calorie count was extended to end 1/5 PM as patient did not consume 3 full meals on 1/4.  CCS discussing possibility of G tube with family. CCS ordered TPN to start on 12/12, pharmacy to manage.  Glucose / Insulin: No hx DM.   CBGs range from 123-166 5 unit of insulin used  in past 24 hours  Electrolytes: Electrolytes  wnl except for CorrCa 11.9 (no Ca in TPN)   Renal: SCr <1 stable, BUN WNL Hepatic: LFTs WNL, Albumin low Trig: WNL (12/26, 1/2) Intake / Output: + 1200 mL - UOP - x4 documented but not quantified, stool output: 380 ml MIVF: None GI Imaging:  - 12/27 abd CT: Postsurgical changes with a right lower quadrant ileostomy and left colostomy. No bowel obstruction. 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 Interval decrease in the size of the fluid density inferior to the gallbladder. No drainable fluid collection Identified. Complex collection in the deep subcutaneous soft tissues of theanterior abdominal wall along the midline extending to the skin at the level of the umbilicus GI Surgeries / Procedures:  11/27: Expiratory laparotomy, extended  right colectomy, end ileostomy  Central access: PICC placed 12/11 TPN start date: 12/12  Nutritional Goals: RD Assessment: Estimated Needs Total Energy Estimated Needs: 1950-2150 Total Protein Estimated Needs: 95-105g Total Fluid Estimated Needs: 2L/day  Current Nutrition:  Regular diet- Minimal intake, patient noted to be pocketing food and will spit out Ensure Enlive TID (20 g of protein and 350 kcal) *Intake not consistent Juven supplement packet (2.5g of protein and 95 kcal) *Intake not consistent Half rate cyclic TPN - 0,973 mL provides 54g protein and 1094 kcal per day. Attempting to stimulate appetite during the day.    Plan:   At 53:29:  Continue cyclic TPN giving ~92% of nutritional needs. Infuse 1,000 mL over 14 hrs: 38 mL/hr x 1 hr, then 77 mL/hr x 12 hrs, then 38 mL/hr x 1 hr. Electrolytes in TPN. Na 26mEq/L K 70 mEq/L Ca 86mEq/L Mg 7 mEq/L Phos 20 mmol/L Cl:Ac max Ac SSI sensitive TID and adjust as needed Continue multivitamin PO Thiamine per neurology Monitor TPN labs on Mon/Thurs Follow up calorie counts and/or PEG tube discussions  Royetta Asal, PharmD, BCPS 12/29/2021 7:55 AM

## 2021-12-30 DIAGNOSIS — R410 Disorientation, unspecified: Secondary | ICD-10-CM | POA: Diagnosis not present

## 2021-12-30 DIAGNOSIS — G9341 Metabolic encephalopathy: Secondary | ICD-10-CM | POA: Diagnosis not present

## 2021-12-30 LAB — COMPREHENSIVE METABOLIC PANEL
ALT: 15 U/L (ref 0–44)
AST: 16 U/L (ref 15–41)
Albumin: 1.9 g/dL — ABNORMAL LOW (ref 3.5–5.0)
Alkaline Phosphatase: 88 U/L (ref 38–126)
Anion gap: 5 (ref 5–15)
BUN: 22 mg/dL (ref 8–23)
CO2: 28 mmol/L (ref 22–32)
Calcium: 10.8 mg/dL — ABNORMAL HIGH (ref 8.9–10.3)
Chloride: 111 mmol/L (ref 98–111)
Creatinine, Ser: 0.43 mg/dL — ABNORMAL LOW (ref 0.44–1.00)
GFR, Estimated: >60 mL/min (ref >60)
Glucose, Bld: 155 mg/dL — ABNORMAL HIGH (ref 70–99)
Potassium: 3.8 mmol/L (ref 3.5–5.1)
Sodium: 144 mmol/L (ref 135–145)
Total Bilirubin: 0.3 mg/dL (ref 0.3–1.2)
Total Protein: 7.6 g/dL (ref 6.5–8.1)

## 2021-12-30 LAB — MAGNESIUM: Magnesium: 2.2 mg/dL (ref 1.7–2.4)

## 2021-12-30 LAB — GLUCOSE, CAPILLARY
Glucose-Capillary: 140 mg/dL — ABNORMAL HIGH (ref 70–99)
Glucose-Capillary: 154 mg/dL — ABNORMAL HIGH (ref 70–99)
Glucose-Capillary: 104 mg/dL — ABNORMAL HIGH (ref 70–99)
Glucose-Capillary: 97 mg/dL (ref 70–99)
Glucose-Capillary: 141 mg/dL — ABNORMAL HIGH (ref 70–99)

## 2021-12-30 LAB — CBC
HCT: 30.2 % — ABNORMAL LOW (ref 36.0–46.0)
Hemoglobin: 9.4 g/dL — ABNORMAL LOW (ref 12.0–15.0)
MCH: 27.6 pg (ref 26.0–34.0)
MCHC: 31.1 g/dL (ref 30.0–36.0)
MCV: 88.8 fL (ref 80.0–100.0)
Platelets: 408 10*3/uL — ABNORMAL HIGH (ref 150–400)
RBC: 3.4 MIL/uL — ABNORMAL LOW (ref 3.87–5.11)
RDW: 16.6 % — ABNORMAL HIGH (ref 11.5–15.5)
WBC: 13.5 10*3/uL — ABNORMAL HIGH (ref 4.0–10.5)
nRBC: 0 % (ref 0.0–0.2)

## 2021-12-30 LAB — TRIGLYCERIDES: Triglycerides: 68 mg/dL (ref <150)

## 2021-12-30 LAB — PHOSPHORUS: Phosphorus: 3.1 mg/dL (ref 2.5–4.6)

## 2021-12-30 MED ORDER — ALTEPLASE 2 MG IJ SOLR
2.0000 mg | Freq: Once | INTRAMUSCULAR | Status: AC
  Administered 2021-12-30: 2 mg
  Filled 2021-12-30: qty 2

## 2021-12-30 MED ORDER — TRAVASOL 10 % IV SOLN
INTRAVENOUS | Status: AC
  Filled 2021-12-30: qty 540

## 2021-12-30 NOTE — Progress Notes (Signed)
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 Harlow Asa 11/27.  Per recent MD notes: "Encephalopathy/FTT - Patient has been on folic acid and thiamine supplements. Noted to have poor motor control and coordination the other day by PT which is different from last week per therapy notes, Neurology consulted - appreciated their input. No further workup needed at this time from their standpoint.    - psych saw 12/27 and determined patient does not have capacity. Patient has been worsening since admission.  She was confused from POD 1 on. Question if she was like this prior to admission, but lived alone and no one aware of these issues."    PT Comments    Pt with difficulty following commands, requires max multimodal cues, verbally confirms/repeats back cues but unable to perform and appears to lack insight into current deficits. Pt requires max A+2/total A with bed mobility. Assisted into hooklying position with BLE slowly releasing from position back to bed due to pt's inability to hold, also unable to hold knee flexion with ankles supported in hooklying. Pt able to squeeze therapist's hands with bil hands, unable to cue quad, hamstring, hip flexion, hip abductor, hip adductor or ankle dorsiflexion or plantarflexion bilaterally. Pt with noted R foot tremor in supine, but declines realizing it is occurring. In sitting, pt with zero sitting balance, strong posterior right trunk lean with right lateral trunk flexion as well. Pt unable to find normal postural alignment despite placing pt in position, attempting to mirror pt's sitting posture and propping on BUE. Assisted pt back to supine and scooted up in bed to comfort. Pt has been very limited with progress of  skilled PT interventions due to cognition, though is able to follow some commands. Pt lacks motor planning and demonstrates profound weakness throughout all extremities and trunk, is very limited by fatigue and decreased conditioning. Plan to continue assessment of abilities and deficits and progress PT as able; hopeful further medical investigation will assist in PT efforts. RN notified of pt's HR up to 113 with mobility, SpO2 98% on RA.   Recommendations for follow up therapy are one component of a multi-disciplinary discharge planning process, led by the attending physician.  Recommendations may be updated based on patient status, additional functional criteria and insurance authorization.  Follow Up Recommendations  Skilled nursing-short term rehab (<3 hours/day)     Assistance Recommended at Discharge Frequent or constant Supervision/Assistance  Patient can return home with the following     Equipment Recommendations  Hospital bed (hoyer lift)    Recommendations for Other Services       Precautions / Restrictions Precautions Precautions: Fall Precaution Comments: abdominal surgery, RUQ ileostomy Restrictions Weight Bearing Restrictions: No     Mobility  Bed Mobility Overal bed mobility: Needs Assistance Bed Mobility: Rolling;Sidelying to Sit;Sit to Supine Rolling: Max assist;+2 for physical assistance Sidelying to sit: Max assist;+2 for physical assistance  Sit to supine: Total assist;+2 for safety/equipment   General bed mobility comments: max A +2 for rolling to reposition bed pad and for supine<>sit through sidelying to protect abdomen; pt requires max A to sit EOB with strong R lateral lean with trunk flexion to the R, unable to cue pt back to normal postural alignment and pt appears unaware of assistance needed  and trunk lean; attempted to prop on BUE with fair use of LUE only, though continues to require assist for static sitting EOB; PT placed pt's BLE into hooklying  position with slow release from position into hip abduction, unable to assist with scooting up in bed    Transfers  General transfer comment: not attempted d/t poor limited sitting balance    Ambulation/Gait                   Stairs             Wheelchair Mobility    Modified Rankin (Stroke Patients Only)       Balance Overall balance assessment: Needs assistance Sitting-balance support: Feet unsupported;Single extremity supported Sitting balance-Leahy Scale: Zero Sitting balance - Comments: mod-max A to sit EOB, strong R lateral lean with R trunk lateral trunk flexion. Pt unable to correct to midline, attempted with proping on BUE and providing mirrored position without improvement; assisted pt to normal postural alignment with slow recline back to R and strong posterior lean. Pt tolerates sitting EOB for ~10 minutes with assist before assisting back to supine Postural control: Right lateral lean;Posterior lean     Cognition Arousal/Alertness: Awake/alert Behavior During Therapy: Flat affect Overall Cognitive Status: Impaired/Different from baseline Area of Impairment: Orientation;Attention;Memory;Following commands;Safety/judgement;Awareness;Problem solving  Orientation Level: Disoriented to;Time;Situation;Place  Following Commands: Follows one step commands inconsistently Safety/Judgement: Decreased awareness of safety;Decreased awareness of deficits   Problem Solving: Slow processing;Decreased initiation;Difficulty sequencing;Requires verbal cues;Requires tactile cues General Comments: pt greets therapist with hello and smile, able to state name, unable to state being in the hospital and states year is "2020 or 2022". Pt states "ok" and "yes I can" when cued on mobility, though no active movement noted after verbal confirmation. Pt requires redirection to task and max multimodal cues with initiation cues to perform tasks. Pt with difficulty motor planning, able  to squeeze therapists hand bilaterally.        Exercises      General Comments        Pertinent Vitals/Pain Pain Assessment: No/denies pain (pt denies pain, but facial wincing noted with mobility)    Home Living             Prior Function            PT Goals (current goals can now be found in the care plan section) Acute Rehab PT Goals Patient Stated Goal: to get better and regain mobility PT Goal Formulation: With patient Time For Goal Achievement: 01/01/22 Potential to Achieve Goals: Fair Progress towards PT goals: Not progressing toward goals - comment (limited by cognition)    Frequency    Min 2X/week      PT Plan Current plan remains appropriate    Co-evaluation              AM-PAC PT "6 Clicks" Mobility   Outcome Measure  Help needed turning from your back to your side while in a flat bed without using bedrails?: Total Help needed moving from lying on your back to sitting on the side of a flat bed without using bedrails?: Total Help needed moving to and from a bed to a chair (including a wheelchair)?: Total Help needed standing up from a chair using your arms (e.g., wheelchair or bedside chair)?: Total Help needed to walk in hospital room?: Total Help needed climbing 3-5 steps with a railing? : Total 6 Click Score: 6    End of Session   Activity  Tolerance: Patient limited by fatigue Patient left: in bed;with call bell/phone within reach;with bed alarm set Nurse Communication: Mobility status;Other (comment) (HR and SpO2) PT Visit Diagnosis: Muscle weakness (generalized) (M62.81);Other abnormalities of gait and mobility (R26.89);Other symptoms and signs involving the nervous system (G18.299)     Time: 3716-9678 PT Time Calculation (min) (ACUTE ONLY): 19 min  Charges:  $Therapeutic Activity: 8-22 mins                      Tori Wadie Liew PT, DPT 12/30/21, 11:29 AM

## 2021-12-30 NOTE — Consult Note (Signed)
[place] Nurse ostomy follow up Patient receiving care in [place]. No family preset. Stoma type/location: RUQ ileostomy, 1 1/4 inches, round, moist, pink. Mucous fistula (MF) LUQ Stomal assessment/size: MF stoma is pink and moist oval just above skin level. RUQ ileostomy is pink budded. Peristomal assessment: ileostomy peristomal skin intact Treatment options for stomal/peristomal skin: barrier ring Output: thin dark green output from ileostomy. No OP from MF Ostomy pouching: 2pc. 2 and 1/4 inch skin barrier and pouch. 1 pc on the MF Education provided: none; patient cannot be taught ostomy self care. Enrolled patient in [facility] Discharge program: Yes, previously.   [facility] team will see patient weekly. Bedside nurse to perform pouch changes as needed.   Cathlean Marseilles Tamala Julian, MSN, RN, [place], Lysle Pearl, [facility] Wound Treatment Associate Pager [phone]

## 2021-12-30 NOTE — Progress Notes (Signed)
Progress Note  43 Days Post-Op  Subjective: Pt oriented to self, not able to answer remaining orientation questions easily today. Seemed to become distracted easily. She denied abdominal pain. Reports she ate some this AM. No family at bedside this AM.   Objective: Vital signs in last 24 hours: Temp:  [97.3 F (36.3 C)-98.2 F (36.8 C)] 98.2 F (36.8 C) (01/09 1357) Pulse Rate:  [101-109] 109 (01/09 1357) Resp:  [16-20] 16 (01/09 1357) BP: (94-119)/(59-78) 108/75 (01/09 1357) SpO2:  [97 %-99 %] 98 % (01/09 1357) Weight:  [73.3 kg] 73.3 kg (01/09 0635) Last BM Date: 12/28/21  Intake/Output from previous day: 01/08 0701 - 01/09 0700 In: 1302 [P.O.:660; I.V.:642] Out: 1275 [Urine:900; Stool:375] Intake/Output this shift: Total I/O In: 160 [P.O.:160] Out: -   PE: General: resting comfortably, NAD Resp: normal work of breathing Abdomen: soft, nondistended, nontender to palpation. Midline incision with dressing in place and purulent drainage noted. Ileostomy productive of loose stool. 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 Extremities: warm and well-perfused Neuro: FC, no focal deficit laying in bed   Lab Results:  Recent Labs    12/30/21 1048  WBC 13.5*  HGB 9.4*  HCT 30.2*  PLT 408*   BMET Recent Labs    12/29/21 0630 12/30/21 0550  NA 141 144  K 4.2 3.8  CL 109 111  CO2 28 28  GLUCOSE 141* 155*  BUN 23 22  CREATININE 0.38* 0.43*  CALCIUM 10.3 10.8*   PT/INR No results for input(s): LABPROT, INR in the last 72 hours. CMP     Component Value Date/Time   NA 144 12/30/2021 0550   K 3.8 12/30/2021 0550   CL 111 12/30/2021 0550   CO2 28 12/30/2021 0550   GLUCOSE 155 (H) 12/30/2021 0550   BUN 22 12/30/2021 0550   CREATININE 0.43 (L) 12/30/2021 0550   CALCIUM 10.8 (H) 12/30/2021 0550   PROT 7.6 12/30/2021 0550   ALBUMIN 1.9 (L) 12/30/2021 0550   AST 16 12/30/2021 0550   ALT 15 12/30/2021 0550   ALKPHOS 88 12/30/2021 0550   BILITOT 0.3  12/30/2021 0550   GFRNONAA >60 12/30/2021 0550   Lipase     Component Value Date/Time   LIPASE 22 11/17/2021 0955       Studies/Results: No results found.  Anti-infectives: Anti-infectives (From admission, onward)    Start     Dose/Rate Route Frequency Ordered Stop   11/25/21 1000  piperacillin-tazobactam (ZOSYN) IVPB 3.375 g  Status:  Discontinued        3.375 g 12.5 mL/hr over 240 Minutes Intravenous Every 8 hours 11/25/21 0909 12/01/21 0822   11/17/21 1830  piperacillin-tazobactam (ZOSYN) IVPB 3.375 g        3.375 g 12.5 mL/hr over 240 Minutes Intravenous Every 8 hours 11/17/21 1415 11/22/21 2359   11/17/21 1230  piperacillin-tazobactam (ZOSYN) IVPB 3.375 g        3.375 g 100 mL/hr over 30 Minutes Intravenous  Once 11/17/21 1220 11/17/21 1304        Assessment/Plan 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 Harlow Asa 11/27 - path: necrotic colon at site of perforation, no malignancy or dysplasia noted  - Ileostomy output improved. Continue BID metamucil.  - WOC following for ileostomy, mucous fistula - TID saline WTD dressing changes to midline incision - Pathology benign. CT distal descending/proximal sigmoid bowel luminal collapse.   FEN - Dysphagia 2 diet  as tolerated (brother reports patient has difficulty chewing anything that is not soft or pureed).  Protein supplements per dietitian. G tube may be necessary to get adequate nutrition.  Awaiting decision on G-tube from brother.  No family at bedside today.   VTE - SCDs, Lovenox ID - [email or web address] 11/27 - 12/2. 12/5-12/11 Foley - Purewick prn   - encephalopathy/FTT - see note yesterday from Dr. [name] - appreciate consultation at request of Dr. Johnnye Sima. Neurology saw a few weeks ago and signed off. On folic acid and thiamine supplementation.    - psych saw 12/27 and determined patient does not have capacity   Dispo: Patient will need  SNF placement at discharge. Appreciate case management sending documents to [facility] in [place].  We will continue to work on disposition with case management and family.  Family to discuss placement of gastrostomy tube.  LOS: 43 days   I reviewed last 24 h vitals and pain scores, last 48 h intake and output, and last 24 h labs and trends.  This care required low level of medical decision making.    Norm Parcel, [facility] 12/30/2021, 2:33 PM Please see Amion for pager number during day hours 7:00am-4:30pm

## 2021-12-30 NOTE — Progress Notes (Signed)
°  Progress Note   Patient: Sherry Hampton BSW:[id number] DOB: [birth year] DOA: 11/17/2021     43 DOS: the patient was seen and examined on 12/30/2021   Brief hospital course: 66 year old woman according to chart was independent, living alone and driving and continue to work as needed.  PMH including hyperlipidemia, possibly some memory loss, presented 11/27 with sudden abdominal pain.  Underwent emergent surgery for perforated viscus of the ascending colon with fecal peritonitis same day.  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  Hospitalization has been complicated by fluid collections in the abdomen, malnutrition, currently on TNA, intermittent encephalopathy and failure to thrive and progressive decline in strength and ability to participate with physical therapy.  Assessment and Plan * Perforated abdominal viscus- (present on admission) --per surgery  Delirium -- Continue delirium precautions, including natural light during the day, minimization of noise, TV off or on soothing music, lights on during the day, off at night, minimize disturbances of sleep at night, mobilize as much as possible, close attention to bowel and bladder function.  Hearing aids or glasses needed. --Do not see any extra-abdominal etiologies for this state. -- No apparent change  Acute metabolic encephalopathy- (present on admission) -- Alert, exam unchanged, able to grip bilaterally to command otherwise does not follow commands. -- Affect is odd, consider neurology consultation  Abnormal neurological exam -- Exam remains difficult, patient does not comply with examination -- Given therapy concerns for UMN signs, clonus, profound weakness and new neurologic symptoms, I recommend neurology consultation  Protein-calorie malnutrition, severe -- Continue nutrition as per surgery  Memory loss -- According to the chart mild prior to  admission, patient was independent and still working at times     I do not see any active reversible medical issues, will see again tomorrow.  Subjective:  Feels fine No complaints No pain History not reliable  Objective Vital signs were reviewed and unremarkable. Physical Exam Vitals reviewed.  Constitutional:      General: She is not in acute distress.    Appearance: She is not ill-appearing or toxic-appearing.  Cardiovascular:     Rate and Rhythm: Normal rate and regular rhythm.     Heart sounds: No murmur heard. Pulmonary:     Effort: Pulmonary effort is normal. No respiratory distress.     Breath sounds: No rhonchi or rales.  Musculoskeletal:     Comments: Bilateral weak grip strength to command, however does not otherwise follow commands.  Tone upper extremities appears normal.  Strength bilateral upper extremities difficult to assess.  Difficult to assess strength bilateral lower extremities as she does not comply.  Difficult to assess tone.  Neurological:     Mental Status: She is alert.     Comments: Remains confused, oriented only to self, not all responses are appropriate.  Psychiatric:     Comments: Odd affect, mood appears normal.     Data Reviewed:  CMP unremarkable calcium is modestly elevated probably from TPN.  Follow clinically.      Time spent: 25 minutes  Author: Murray Hodgkins, MD 12/30/2021 4:24 PM  For on call review [email or web address].

## 2021-12-30 NOTE — Progress Notes (Signed)
PHARMACY - TOTAL PARENTERAL NUTRITION CONSULT NOTE   Indication:  Inability to meet enteral needs  Patient Measurements: Height: 5\' 10"  (177.8 cm) Weight: 73.3 kg (161 lb 9.6 oz) IBW/kg (Calculated) : 68.5 TPN AdjBW (KG): 79.8 Body mass index is 23.19 kg/m. Usual Weight: 80 kg  Assessment: 66 y.o. 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 She is now extended right colectomy and ileostomy. She developed acute metabolic encephalopathy post-op. She was initiated on TF, but tube became clogged on 12/8 and patient refused replacement, and is not eating enough to maintain nutrition.  Calorie count was extended to end 1/5 PM as patient did not consume 3 full meals on 1/4.  CCS discussing possibility of G tube with family. CCS ordered TPN to start on 12/12, pharmacy to manage.  Glucose / Insulin: No hx DM.   - on sSSI TIDac and qhs (used 3 units in the past 24 hrs) - CBGs: 103-155 Electrolytes:  CorrCa 12.48 (no Ca in TPN)   - other lytes wnl Renal: SCr <1 stable, BUN WNL Hepatic: LFTs WNL, Albumin low Trig: 60 (1/2) Intake / Output: + 1275 mL - UOP: 900 ml - stool:  375 ml MIVF: None GI Imaging:  - 12/27 abd CT: Postsurgical changes with a right lower quadrant ileostomy and left colostomy. No bowel obstruction. 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 Interval decrease in the size of the fluid density inferior to the gallbladder. No drainable fluid collection Identified. Complex collection in the deep subcutaneous soft tissues of theanterior abdominal wall along the midline extending to the skin at the level of the umbilicus - 1/1 abd CT: There is again noted wall thickening and inflammatory changes involving small bowel loops in  the right lower quadrant near the ileostomy suggesting enteritis with surrounding inflammation GI Surgeries / Procedures:  11/27: Expiratory laparotomy, extended right colectomy, end ileostomy  Central access: PICC placed 12/11 TPN start date: 12/12  Nutritional Goals: RD Assessment: Estimated Needs Total Energy Estimated Needs: 1950-2150 Total Protein Estimated Needs: 95-105g Total Fluid Estimated Needs: 2L/day  Current Nutrition:  Regular diet- Minimal intake, patient noted to be pocketing food and will spit out Ensure Enlive TID (20 g of protein and 350 kcal) *Intake not consistent Juven supplement packet (2.5g of protein and 95 kcal) *Intake not consistent Half rate cyclic TPN - 0,960 mL provides 54g protein and 1094 kcal per day. Attempting to stimulate appetite during the day.    Plan:   At 45:40:  Continue cyclic TPN giving ~98% of nutritional needs. Infuse 1,000 mL over 14 hrs: 38 mL/hr x 1 hr, then 77 mL/hr x 12 hrs, then 38 mL/hr x 1 hr. Electrolytes in TPN. Na 49mEq/L K 70 mEq/L Ca 0 mEq/L Mg 7 mEq/L Phos 20 mmol/L Cl:Ac max Ac SSI sensitive achs and adjust as needed Continue multivitamin PO Thiamine per neurology Monitor TPN labs on Mon/Thurs Bmet, phos, mag on 1/10  Dia Sitter, PharmD, BCPS 12/30/2021 7:11 AM

## 2021-12-31 DIAGNOSIS — G9341 Metabolic encephalopathy: Secondary | ICD-10-CM | POA: Diagnosis not present

## 2021-12-31 DIAGNOSIS — R198 Other specified symptoms and signs involving the digestive system and abdomen: Secondary | ICD-10-CM | POA: Diagnosis not present

## 2021-12-31 DIAGNOSIS — R531 Weakness: Secondary | ICD-10-CM

## 2021-12-31 LAB — BASIC METABOLIC PANEL
Anion gap: 4 — ABNORMAL LOW (ref 5–15)
BUN: 24 mg/dL — ABNORMAL HIGH (ref 8–23)
CO2: 30 mmol/L (ref 22–32)
Calcium: 11.1 mg/dL — ABNORMAL HIGH (ref 8.9–10.3)
Chloride: 111 mmol/L (ref 98–111)
Creatinine, Ser: 0.46 mg/dL (ref 0.44–1.00)
GFR, Estimated: >60 mL/min (ref >60)
Glucose, Bld: 152 mg/dL — ABNORMAL HIGH (ref 70–99)
Potassium: 4 mmol/L (ref 3.5–5.1)
Sodium: 145 mmol/L (ref 135–145)

## 2021-12-31 LAB — URINALYSIS, ROUTINE W REFLEX MICROSCOPIC
Bilirubin Urine: NEGATIVE
Glucose, UA: NEGATIVE mg/dL
Hgb urine dipstick: NEGATIVE
Ketones, ur: NEGATIVE mg/dL
Leukocytes,Ua: NEGATIVE
Nitrite: NEGATIVE
Protein, ur: NEGATIVE mg/dL
Specific Gravity, Urine: 1.018 (ref 1.005–1.030)
pH: 7 (ref 5.0–8.0)

## 2021-12-31 LAB — GLUCOSE, CAPILLARY
Glucose-Capillary: 137 mg/dL — ABNORMAL HIGH (ref 70–99)
Glucose-Capillary: 143 mg/dL — ABNORMAL HIGH (ref 70–99)
Glucose-Capillary: 118 mg/dL — ABNORMAL HIGH (ref 70–99)
Glucose-Capillary: 111 mg/dL — ABNORMAL HIGH (ref 70–99)
Glucose-Capillary: 145 mg/dL — ABNORMAL HIGH (ref 70–99)

## 2021-12-31 LAB — MAGNESIUM: Magnesium: 2.2 mg/dL (ref 1.7–2.4)

## 2021-12-31 LAB — PHOSPHORUS: Phosphorus: 3.1 mg/dL (ref 2.5–4.6)

## 2021-12-31 MED ORDER — TRAVASOL 10 % IV SOLN
INTRAVENOUS | Status: AC
  Filled 2021-12-31: qty 540

## 2021-12-31 NOTE — Progress Notes (Signed)
Spoke to [place] PA this morning regarding Patient's sister requesting test.

## 2021-12-31 NOTE — Progress Notes (Signed)
°  Progress Note   Patient: Sherry Hampton AVW:[id number] DOB: 01-24-1956 DOA: 11/17/2021     44 DOS: the patient was seen and examined on 12/31/2021   Brief hospital course: 66 year old woman according to chart was independent, living alone and driving and continue to work as needed.  PMH including hyperlipidemia, possibly some memory loss, presented 11/27 with sudden abdominal pain.  Underwent emergent surgery for perforated viscus of the ascending colon with fecal peritonitis same day.  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  Hospitalization has been complicated by fluid collections in the abdomen, malnutrition, currently on TNA, intermittent encephalopathy and failure to thrive and progressive decline in strength and ability to participate with physical therapy.  Neurology has been reconsulted.  I do note the calcium was somewhat elevated today.  See below.  Assessment and Plan * Perforated abdominal viscus- (present on admission) --per surgery  Delirium -- Continue delirium precautions, including natural light during the day, minimization of noise, TV off or on soothing music, lights on during the day, off at night, minimize disturbances of sleep at night, mobilize as much as possible, close attention to bowel and bladder function.  Hearing aids or glasses needed. --Do not see any extra-abdominal etiologies for this state. -- No apparent change  Acute metabolic encephalopathy- (present on admission) -- Alert, exam unchanged, agree with neurology consultation.  Exam was very difficult.  Mentation appears well.  See my initial consult note for review of the chart and extensive work-up.  No metabolic or extra-abdominal process identified.  Hypercalcemia -- She has had mild elevations during this hospitalization of unclear significance.  Certainly these mild elevations would not explain her mental status or be  contributing.  This may be and most likely is from TNA.  Continue to monitor.  Will check PTH.  Abnormal neurological exam -- Exam remains difficult, patient does not comply with examination -- Given therapy concerns for UMN signs, clonus, profound weakness and new neurologic symptoms, I agree with neurology consultation  Protein-calorie malnutrition, severe -- Continue nutrition as per surgery  Memory loss -- According to the chart mild prior to admission, patient was independent and still working at times     Subjective:  Feels ok Confused History not reliable  Objective Vital signs were reviewed and unremarkable. Physical Exam Vitals reviewed.  Constitutional:      General: She is not in acute distress.    Appearance: She is ill-appearing. She is not toxic-appearing.  Cardiovascular:     Rate and Rhythm: Normal rate and regular rhythm.     Heart sounds: No murmur heard. Pulmonary:     Effort: Pulmonary effort is normal. No respiratory distress.     Breath sounds: No wheezing, rhonchi or rales.  Musculoskeletal:     Right lower leg: No edema.     Left lower leg: No edema.     Comments: Moves arms spontaneously.  Resistant to movement of the legs.  Tone seems intact.  Neurological:     Mental Status: She is alert.  Psychiatric:     Comments: Difficult to assess mood, affect is odd.  Remains confused, does not respond appropriately to questions other than her name.     Data Reviewed: Calcium is now 11.1, review of record shows intermittent elevations.   Time spent: 20 minutes  Author: Murray Hodgkins, MD 12/31/2021 3:51 PM  For on call review [email or web address].

## 2021-12-31 NOTE — Progress Notes (Signed)
PHARMACY - TOTAL PARENTERAL NUTRITION CONSULT NOTE   Indication:  Inability to meet enteral needs  Patient Measurements: Height: 5\' 10"  (177.8 cm) Weight: 73.3 kg (161 lb 9.6 oz) IBW/kg (Calculated) : 68.5 TPN AdjBW (KG): 79.8 Body mass index is 23.19 kg/m. Usual Weight: 80 kg  Assessment: 66 y.o. 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 She is now extended right colectomy and ileostomy. She developed acute metabolic encephalopathy post-op. She was initiated on TF, but tube became clogged on 12/8 and patient refused replacement, and is not eating enough to maintain nutrition.  Calorie count was extended to end 1/5 PM as patient did not consume 3 full meals on 1/4.  CCS discussing possibility of G tube with family. CCS ordered TPN to start on 12/12, pharmacy to manage.  Glucose / Insulin: No hx DM.   - on sSSI TIDac and qhs (used 3 units in the past 24 hrs) - CBGs: 97-152 Electrolytes:  CorrCa up 12.78 (no Ca in TPN) , Na trending up 145 - other lytes wnl Renal: SCr <1 stable, BUN WNL Hepatic: LFTs WNL, Albumin low Trig: 60 (1/2) Intake / Output: + 1017 mL - UOP: 4x - stool:  200 ml MIVF: None GI Imaging:  - 12/27 abd CT: Postsurgical changes with a right lower quadrant ileostomy and left colostomy. No bowel obstruction. 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 Interval decrease in the size of the fluid density inferior to the gallbladder. No drainable fluid collection Identified. Complex collection in the deep subcutaneous soft tissues of theanterior abdominal wall along the midline extending to the skin at the level of the umbilicus - 1/1 abd CT: There is again noted wall thickening and inflammatory changes involving small  bowel loops in the right lower quadrant near the ileostomy suggesting enteritis with surrounding inflammation GI Surgeries / Procedures:  11/27: Expiratory laparotomy, extended right colectomy, end ileostomy  Central access: PICC placed 12/11 TPN start date: 12/12  Nutritional Goals: RD Assessment: Estimated Needs Total Energy Estimated Needs: 1950-2150 Total Protein Estimated Needs: 95-105g Total Fluid Estimated Needs: 2L/day  Current Nutrition:  Regular diet- Minimal intake, patient noted to be pocketing food and will spit out Ensure Enlive TID (20 g of protein and 350 kcal) *Intake not consistent Juven supplement packet (2.5g of protein and 95 kcal) *Intake not consistent Half rate cyclic TPN - 2,620 mL provides 54g protein and 1094 kcal per day. Attempting to stimulate appetite during the day.    Plan:   At 35:59:  Continue cyclic TPN giving ~74% of nutritional needs. Infuse 1,000 mL over 14 hrs: 38 mL/hr x 1 hr, then 77 mL/hr x 12 hrs, then 38 mL/hr x 1 hr. Electrolytes in TPN. Reduce Na to 65mEq/L K 70 mEq/L Ca 0 mEq/L Mg 7 mEq/L Phos 20 mmol/L Cl:Ac max Ac SSI sensitive achs and adjust as needed Continue multivitamin PO Thiamine per neurology Monitor TPN labs on Mon/Thurs Bmet, phos, mag on 1/11  Dia Sitter, PharmD, BCPS 12/31/2021 6:58 AM

## 2021-12-31 NOTE — Progress Notes (Signed)
Called Renne McCulley to update patient's status. She was very [employer] .

## 2021-12-31 NOTE — Progress Notes (Signed)
Talked to Patient's sister Debbora Lacrosse regarding her concerns for the patient. Talked to Barkley Boards, PA about sister Renne's concerns aboutt the patient. As per Claiborne Billings, she has been talking to sister [name] regarding patient.

## 2021-12-31 NOTE — Progress Notes (Addendum)
Neurology Progress Note  Brief HPI: 66 y.o. female with PMHx of anemia, RA, cataract, GERD, and HLD who initially presented to [facility] 11/17/21 with abdominal pain and found to have a perforated viscus s/p surgical repair. Patient's prolonged hospitalization has been complicated by severe malnutrition related to acute illness on Thiamine replacement and TNA, intermittent encephalopathy, fluid collections in the abdomen, and failure to thrive. She reported having no appetite and PT has ongoing concerns for incoordination and ataxia since neurology's initial assessment on 12/10/2021.   Patient's sister Sherry Hampton reportedly wrote a letter to patient's providers expressing some concerns and asking for possible further neurologic testing including an EMG/NCS. Neurology was consulted again for further evaluation as patient has ongoing concerns for lack of progression with her mobility and functional status since hospitalization with decline in strength and ability to participate with physical therapy.   When speaking with patient's sister, Sherry Hampton, she expresses concern with her decline in function since her hospitalization as she was walking, driving, and shopping for herself independently prior to hospital arrival. She expresses concern with the amount of time the patient was not tolerating oral intake prior to TPN initiation. She did have some questions regarding spinal imaging, nerve involvement, and EMG/NCS that is unable to be completed on an inpatient basis at this time.   Subjective: Patient is leaning rightward on examination today, noncooperative with exam.  She refuses repositioning.   Exam: Vitals:   12/31/21 0450 12/31/21 1319  BP: 102/67 107/68  Pulse: 92 (!) 105  Resp: 16 19  Temp: 98.4 F (36.9 C) 98.1 F (36.7 C)  SpO2: 98% 97%   Gen: Laying in bed, leaning rightward, unable to self-position. She is in no acute distress Resp: non-labored breathing, no respiratory distress Abd: patient  states that she does not have abdominal pain, audible bowel sounds present throughout assessment  Neuro: Mental Status: Awake, states her first name, provides a different last name than what is listed on the chart. When asked if her last name is the one she provided or the one listed she states "well, since you know everything." When asked her age she states "that is getting personal" and when asked the year she states "I'm not interest so can you please go and harass someone else?" Examiner explained to patient the desire to assess the patient and help her and the patient states "piss off lady" and "bye" Her speech is hypophonic and dysarthric.  Examiner asked patient if she would be willing to allow a physical examination and she states "can you believe this?" Followed by "leave me alone". Examination was terminated at this time.  Cranial Nerves: Patient fixates and tracks examiner around the room, hearing is intact to voice  Motor: Patient spontaneously moves her right upper extremity. There is no spontaneous movement noted of her left upper extremity or bilateral lower extremities.  Sensory: Unable to assess due to patient's refusal for examination  DTR: Unable to assess due to patient's refusal of examination  Gait: Deferred for patient's safety  Pertinent Labs: CBC    Component Value Date/Time   WBC 13.5 (H) 12/30/2021 1048   RBC 3.40 (L) 12/30/2021 1048   HGB 9.4 (L) 12/30/2021 1048   HCT 30.2 (L) 12/30/2021 1048   PLT 408 (H) 12/30/2021 1048   MCV 88.8 12/30/2021 1048   MCH 27.6 12/30/2021 1048   MCHC 31.1 12/30/2021 1048   RDW 16.6 (H) 12/30/2021 1048   LYMPHSABS 1.3 11/29/2021 0522   MONOABS 1.5 (H) 11/29/2021 0522  EOSABS 0.5 11/29/2021 0522   BASOSABS 0.1 11/29/2021 0522   CMP     Component Value Date/Time   NA 145 12/31/2021 0303   K 4.0 12/31/2021 0303   CL 111 12/31/2021 0303   CO2 30 12/31/2021 0303   GLUCOSE 152 (H) 12/31/2021 0303   BUN 24 (H) 12/31/2021 0303    CREATININE 0.46 12/31/2021 0303   CALCIUM 11.1 (H) 12/31/2021 0303   PROT 7.6 12/30/2021 0550   ALBUMIN 1.9 (L) 12/30/2021 0550   AST 16 12/30/2021 0550   ALT 15 12/30/2021 0550   ALKPHOS 88 12/30/2021 0550   BILITOT 0.3 12/30/2021 0550   GFRNONAA >60 12/31/2021 0303   Urinalysis    Component Value Date/Time   COLORURINE YELLOW 12/31/2021 1048   APPEARANCEUR CLOUDY (A) 12/31/2021 1048   LABSPEC 1.018 12/31/2021 1048   PHURINE 7.0 12/31/2021 1048   GLUCOSEU NEGATIVE 12/31/2021 1048   HGBUR NEGATIVE 12/31/2021 1048   BILIRUBINUR NEGATIVE 12/31/2021 1048   KETONESUR NEGATIVE 12/31/2021 1048   PROTEINUR NEGATIVE 12/31/2021 1048   NITRITE NEGATIVE 12/31/2021 1048   LEUKOCYTESUR NEGATIVE 12/31/2021 1048   Lab Results  Component Value Date   VITAMINB12 1,255 (H) 11/27/2021   Lab Results  Component Value Date   TSH 1.807 11/27/2021   Folate >5.9 ng/mL 4.7 Low     Imaging Reviewed:  MRI brain 12/21: 1. No acute intracranial pathology. 2. Mild-to-moderate global parenchymal volume loss and chronic white matter microangiopathy.  Assessment: 66 yo female with prolonged hospitalization secondary to surgical correction of perforated viscus and poor nutritional intake with FTT and progressive worsening mobility, loss of strength, and physical deconditioning. Patient was eventually initiated on TPN and Megace with ongoing minimal PO intake. Patient did also have noted weight loss over a short amount of time initially and was initiated on high dose Thiamine replacement followed by daily supplementation. MRI brain was obtained due to PT initial concerns for ataxia without evidence of acute intracranial pathology. She did not have any other focal neurologic signs on examination and neurology signed off of patient.   Neurology was re-consulted 1/10 after her primary team received a letter from patient's sister, Sherry Hampton, requesting some additional neurologic testing including spinal imaging  and EMG/NCS (not offered inpatient). On Neurology attempt to examine, the patient did not cooperate and refused physical assessment. When speaking with patient's sister, she expresses concern with the amount of time Ms. Gowell was without nutrition before initiating TPN and her diet order following surgery. Discussed with patient's sister deconditioning related to prolonged hospitalization, vitamin replacement ongoing from previous work up, and the need for full patient participation in PT for signs of improvement. Patient did not allow NP to examine her for evaluation of focality in her examination for further imaging studies. If primary team / surgery notes focal weakness on examination, can contact neurology for further imaging recommendations at that time.   Recommendations: - Family questioning spinal imaging but in the absence of examination findings (patient refused exam), it is difficult to recommend further imaging studies. If primary team / surgery notes focal weakness on examination, can contact neurology for further imaging recommendations at that time.  - Can consider EMG/NCS outpatient - Comprehensive vitamin panel, including serum copper level.   Anibal Henderson, AGACNP-BC [facility] [phone]  Addendum: - The patient's vitamin B12 level was above the normal range, most likely due to supplementation.  - Her folate level was low (low folate can cause a neuropathy) - Other vitamin/mineral deficiencies that are  associated with weakness include the following: Copper (myelopathy) Vitamin E (myelopathy) Vitamin D (muscle weakness) Thiamine (neuropathy) Niacin (neuropathy, but no lab test available) Vitamin B6 (neuropathy) - Ordering labs to assess for possible vitamin deficiency - Recommend adding supplemental folate and niacin to her regimen - After thiamine level is drawn, a trial of empiric high-dose thiamine at 500 mg IV TID x 3 days may be of benefit. However, she  already has received IV thiamine without benefit.   Electronically signed: Dr. Kerney Elbe

## 2021-12-31 NOTE — Progress Notes (Signed)
Progress Note  44 Days Post-Op  Subjective: No acute changes. Nursing reports frequent urination today. Family has written a letter requesting multiple neurologic testing.   Objective: Vital signs in last 24 hours: Temp:  [98.2 F (36.8 C)-98.8 F (37.1 C)] 98.4 F (36.9 C) (01/10 0450) Pulse Rate:  [92-109] 92 (01/10 0450) Resp:  [16] 16 (01/10 0450) BP: (102-110)/(67-75) 102/67 (01/10 0450) SpO2:  [98 %] 98 % (01/10 0450) Last BM Date: 12/30/21  Intake/Output from previous day: 01/09 0701 - 01/10 0700 In: 1217 [P.O.:280; I.V.:937] Out: 200 [Stool:200] Intake/Output this shift: Total I/O In: 140 [P.O.:120; I.V.:20] Out: 150 [Stool:150]  PE: General: resting comfortably, NAD Resp: normal work of breathing Abdomen: soft, nondistended, nontender to palpation. Midline incision with dressing in place and purulent drainage noted. Ileostomy productive of loose stool. 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 Extremities: warm and well-perfused Neuro: FC, no focal deficit laying in bed   Lab Results:  Recent Labs    12/30/21 1048  WBC 13.5*  HGB 9.4*  HCT 30.2*  PLT 408*   BMET Recent Labs    12/30/21 0550 12/31/21 0303  NA 144 145  K 3.8 4.0  CL 111 111  CO2 28 30  GLUCOSE 155* 152*  BUN 22 24*  CREATININE 0.43* 0.46  CALCIUM 10.8* 11.1*   PT/INR No results for input(s): LABPROT, INR in the last 72 hours. CMP     Component Value Date/Time   NA 145 12/31/2021 0303   K 4.0 12/31/2021 0303   CL 111 12/31/2021 0303   CO2 30 12/31/2021 0303   GLUCOSE 152 (H) 12/31/2021 0303   BUN 24 (H) 12/31/2021 0303   CREATININE 0.46 12/31/2021 0303   CALCIUM 11.1 (H) 12/31/2021 0303   PROT 7.6 12/30/2021 0550   ALBUMIN 1.9 (L) 12/30/2021 0550   AST 16 12/30/2021 0550   ALT 15 12/30/2021 0550   ALKPHOS 88 12/30/2021 0550   BILITOT 0.3 12/30/2021 0550   GFRNONAA >60 12/31/2021 0303   Lipase     Component Value Date/Time   LIPASE 22 11/17/2021 0955        Studies/Results: No results found.  Anti-infectives: Anti-infectives (From admission, onward)    Start     Dose/Rate Route Frequency Ordered Stop   11/25/21 1000  piperacillin-tazobactam (ZOSYN) IVPB 3.375 g  Status:  Discontinued        3.375 g 12.5 mL/hr over 240 Minutes Intravenous Every 8 hours 11/25/21 0909 12/01/21 0822   11/17/21 1830  piperacillin-tazobactam (ZOSYN) IVPB 3.375 g        3.375 g 12.5 mL/hr over 240 Minutes Intravenous Every 8 hours 11/17/21 1415 11/22/21 2359   11/17/21 1230  piperacillin-tazobactam (ZOSYN) IVPB 3.375 g        3.375 g 100 mL/hr over 30 Minutes Intravenous  Once 11/17/21 1220 11/17/21 1304        Assessment/Plan 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 Harlow Asa 11/27 - path: necrotic colon at site of perforation, no malignancy or dysplasia noted  - Ileostomy output improved. Continue BID metamucil.  - WOC following for ileostomy, mucous fistula - TID saline WTD dressing changes to midline incision - Pathology benign. CT distal descending/proximal sigmoid bowel luminal collapse.   FEN - Dysphagia 2 diet as tolerated (brother reports patient has difficulty chewing anything that is not soft or pureed).  Protein supplements per dietitian. G tube may be necessary to  get adequate nutrition.  Awaiting decision on G-tube from brother.  No family at bedside today.   VTE - SCDs, Lovenox ID - [email or web address] 11/27 - 12/2. 12/5-12/11 Foley - Purewick prn   - encephalopathy/FTT - see note yesterday from Dr. [name] - appreciate consultation at request of Dr. Johnnye Sima. Neurology saw a few weeks ago and signed off. On folic acid and thiamine supplementation. Family requesting further neurologic testing - will re-consult neuro today.    - psych saw 12/27 and determined patient does not have capacity  - frequent urination - wick discontinued secondary to pressure wounds  but per nursing patient is urinating multiple times per hr and has difficulty moving secondary to pain in legs. Recent urine culture with multiple species present - resend UA and Cx today.    Dispo: Patient will need SNF placement at discharge. Appreciate case management sending documents to [facility] in [place].  We will continue to work on disposition with case management and family.  Family to discuss placement of gastrostomy tube.  LOS: 44 days   I reviewed last 24 h vitals and pain scores, last 48 h intake and output, and last 24 h labs and trends.  This care required moderate level of medical decision making.    Norm Parcel, [facility] 12/31/2021, 10:41 AM Please see Amion for pager number during day hours 7:00am-4:30pm

## 2021-12-31 NOTE — Progress Notes (Addendum)
Per request, I returned call to patient's sister, Sherry Hampton, to discuss concerns regarding patient's progress. We had an extensive conversation regarding patient's overall condition and progress through hospitalization as well as concerns over poor progression of mobility and appetite. Gave Sherry Hampton the opportunity to voice concerns and try to explain what interventions have been done and are currently being done for patient. She was very appreciative of the opportunity to discuss. I let her know that we have re-consulted neurology in regards to issues with mobility to make sure there is nothing else they would recommend in working this up. I let her know that I suspect her mobility issues are more than likely related to physical deconditioning and poor nutrition despite TPN and PO nutritional supplements. I let her know that our overall goal is try to help Sherry Hampton get enough nutrition and get to a point where she can go to a rehab facility to try and get more intensive therapy so she can try to rebuild strength. I also let Sherry Hampton know that I suspect this will be a long process for patient and that she may not make it all the way back to what her baseline was prior to hospitalization.   I did discuss that Sherry Hampton (patient's brother) has been the primary contact and she agrees that this should still be the case and that he would be the one to help make medical decisions for Sherry Hampton once any decisions were discussed amongst the family.   I called Sherry Hampton to try to discuss whether a decision was reached in regards to PEG placement to be able to provide enteral supplemental feeding. He informed me that the family has reached a decision in regards to this but did not inform me of whether the family has decided to proceed with PEG placement. He stated he would call me back because his sister was calling on the other line.   Sherry Hampton, [facility] 12/31/2021, 3:28 PM Please see Amion for pager  number during day hours 7:00am-4:30pm   Addendum:  I called Sherry Hampton back and he stated to me that at this time they are not willing to proceed with placement of PEG for enteral feeding.   Sherry Hampton, [facility] 12/31/2021, 4:16 PM Please see Amion for pager number during day hours 7:00am-4:30pm

## 2021-12-31 NOTE — Progress Notes (Signed)
Spoke to Patient's sister [name] regarding her concerns for the patient. Notified Ms. McCulley that I had spoke to [employer] PA this morning.

## 2021-12-31 NOTE — Plan of Care (Signed)
  Problem: Clinical Measurements: Goal: Ability to maintain clinical measurements within normal limits will improve Outcome: Progressing   Problem: Clinical Measurements: Goal: Will remain free from infection Outcome: Progressing   

## 2021-12-31 NOTE — Assessment & Plan Note (Addendum)
Elevated when corrected for albumin (11.4 tpday).  Relatively stable recently, follow intermittently. PTH 44 -> suggests non parathyroid hypercalcemia follow PTHrp (low), vitamin D 1, 25 (slightly low), normal vitamin D 25 hydroxy Related to immobility?  Follow SPEP. Consider additional w/u

## 2022-01-01 ENCOUNTER — Inpatient Hospital Stay (HOSPITAL_COMMUNITY): Payer: Medicare HMO

## 2022-01-01 LAB — CBC WITH DIFFERENTIAL/PLATELET
Abs Immature Granulocytes: 0.05 10*3/uL (ref 0.00–0.07)
Basophils Absolute: 0 10*3/uL (ref 0.0–0.1)
Basophils Relative: 0 %
Eosinophils Absolute: 0.2 10*3/uL (ref 0.0–0.5)
Eosinophils Relative: 2 %
HCT: 29.5 % — ABNORMAL LOW (ref 36.0–46.0)
Hemoglobin: 8.9 g/dL — ABNORMAL LOW (ref 12.0–15.0)
Immature Granulocytes: 0 %
Lymphocytes Relative: 16 %
Lymphs Abs: 1.8 10*3/uL (ref 0.7–4.0)
MCH: 26.9 pg (ref 26.0–34.0)
MCHC: 30.2 g/dL (ref 30.0–36.0)
MCV: 89.1 fL (ref 80.0–100.0)
Monocytes Absolute: 1 10*3/uL (ref 0.1–1.0)
Monocytes Relative: 9 %
Neutro Abs: 8.1 10*3/uL — ABNORMAL HIGH (ref 1.7–7.7)
Neutrophils Relative %: 73 %
Platelets: 363 10*3/uL (ref 150–400)
RBC: 3.31 MIL/uL — ABNORMAL LOW (ref 3.87–5.11)
RDW: 16.7 % — ABNORMAL HIGH (ref 11.5–15.5)
WBC: 11.2 10*3/uL — ABNORMAL HIGH (ref 4.0–10.5)
nRBC: 0 % (ref 0.0–0.2)

## 2022-01-01 LAB — BASIC METABOLIC PANEL
Anion gap: 5 (ref 5–15)
BUN: 31 mg/dL — ABNORMAL HIGH (ref 8–23)
CO2: 28 mmol/L (ref 22–32)
Calcium: 10.6 mg/dL — ABNORMAL HIGH (ref 8.9–10.3)
Chloride: 109 mmol/L (ref 98–111)
Creatinine, Ser: 0.33 mg/dL — ABNORMAL LOW (ref 0.44–1.00)
GFR, Estimated: >60 mL/min (ref >60)
Glucose, Bld: 142 mg/dL — ABNORMAL HIGH (ref 70–99)
Potassium: 3.7 mmol/L (ref 3.5–5.1)
Sodium: 142 mmol/L (ref 135–145)

## 2022-01-01 LAB — ALBUMIN: Albumin: 1.8 g/dL — ABNORMAL LOW (ref 3.5–5.0)

## 2022-01-01 LAB — GLUCOSE, CAPILLARY
Glucose-Capillary: 139 mg/dL — ABNORMAL HIGH (ref 70–99)
Glucose-Capillary: 126 mg/dL — ABNORMAL HIGH (ref 70–99)
Glucose-Capillary: 121 mg/dL — ABNORMAL HIGH (ref 70–99)
Glucose-Capillary: 100 mg/dL — ABNORMAL HIGH (ref 70–99)

## 2022-01-01 LAB — PHOSPHORUS: Phosphorus: 2.6 mg/dL (ref 2.5–4.6)

## 2022-01-01 LAB — MAGNESIUM: Magnesium: 2.1 mg/dL (ref 1.7–2.4)

## 2022-01-01 MED ORDER — IOHEXOL 9 MG/ML PO SOLN
ORAL | Status: AC
  Administered 2022-01-01: 500 mL via ORAL
  Filled 2022-01-01: qty 1000

## 2022-01-01 MED ORDER — SODIUM CHLORIDE 0.9 % IV SOLN: INTRAVENOUS | Status: DC

## 2022-01-01 MED ORDER — TRAVASOL 10 % IV SOLN
INTRAVENOUS | Status: AC
  Filled 2022-01-01: qty 540

## 2022-01-01 MED ORDER — PIPERACILLIN-TAZOBACTAM 3.375 G IVPB
3.3750 g | Freq: Three times a day (TID) | INTRAVENOUS | Status: DC
  Administered 2022-01-01 – 2022-01-10 (×26): 3.375 g via INTRAVENOUS
  Filled 2022-01-01 (×25): qty 50

## 2022-01-01 MED ORDER — DAKINS (1/4 STRENGTH) 0.125 % EX SOLN
Freq: Three times a day (TID) | CUTANEOUS | Status: AC
  Filled 2022-01-01 (×2): qty 473

## 2022-01-01 MED ORDER — IOHEXOL 9 MG/ML PO SOLN
500.0000 mL | ORAL | Status: AC
  Administered 2022-01-01: 500 mL via ORAL

## 2022-01-01 MED ORDER — IOHEXOL 350 MG/ML SOLN
80.0000 mL | Freq: Once | INTRAVENOUS | Status: AC | PRN
  Administered 2022-01-01: 80 mL via INTRAVENOUS

## 2022-01-01 MED ORDER — ALTEPLASE 2 MG IJ SOLR
2.0000 mg | Freq: Once | INTRAMUSCULAR | Status: AC
  Administered 2022-01-01: 2 mg
  Filled 2022-01-01: qty 2

## 2022-01-01 NOTE — Progress Notes (Signed)
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 Harlow Asa 11/27.  Per recent MD notes: "Encephalopathy/FTT - Patient has been on folic acid and thiamine supplements. Noted to have poor motor control and coordination the other day by PT which is different from last week per therapy notes, Neurology consulted - appreciated their input. No further workup needed at this time from their standpoint.    - psych saw 12/27 and determined patient does not have capacity. Patient has been worsening since admission.  She was confused from POD 1 on. Question if she was like this prior to admission, but lived alone and no one aware of these issues."   OT comments  Patient was noted to have discoloration in mouth with nursing aware. Patient was max A to attempt to engage in oral hygiene tasks. Patient was able to hold cup of water and take sips but unable to follow commands to spit water out. Patient reported being very thirsty at this time. Patient engaged in gentle ROM of UE to maintain ROM. Patient's discharge plan remains appropriate at this time. OT will continue to follow acutely.     Recommendations for follow up therapy are one component of a multi-disciplinary discharge planning process, led by the attending physician.  Recommendations may be updated based on patient status, additional functional criteria and insurance authorization.    Follow Up Recommendations  Skilled nursing-short term rehab (<3 hours/day)    Assistance Recommended at Discharge Frequent or constant Supervision/Assistance  Patient can return home with the following  A lot of help with bathing/dressing/bathroom;Two people to help with bathing/dressing/bathroom;Two people to help with  walking and/or transfers;A lot of help with walking and/or transfers;Assistance with cooking/housework   Equipment Recommendations  None recommended by OT    Recommendations for Other Services      Precautions / Restrictions Precautions Precautions: Fall Precaution Comments: abdominal surgery, RUQ ileostomy Restrictions Weight Bearing Restrictions: No       Mobility Bed Mobility Overal bed mobility: Needs Assistance             General bed mobility comments: patient listing to R side in bed with pillow added for positioning patient declined to attempt to sit on edge of bed on this date.    Transfers                         Balance                                           ADL either performed or assessed with clinical judgement   ADL Overall ADL's : Needs assistance/impaired Eating/Feeding: Set up Eating/Feeding Details (indicate cue type and reason): patient needed set up to take drinks of water from cup with patient able to hold cup with min guard in LUE and take sips sitting upright in bed. was able to drink 2 cups of water. nurse made aware Grooming: Oral care;Maximal assistance Grooming Details (indicate cue type and reason): patient needed max A for attempts at oral care on this date with patient needing increased cues to particiapte in tasks. patient was unable to follow commands about spitting out  water to rinse mouth.patient noted to have increased unknown black substance in mouth with nursing aware having attempted to clear mouth prior to this session.                                    Extremity/Trunk Assessment              Vision Baseline Vision/History: 1 Wears glasses Ability to See in Adequate Light: 2 Moderately impaired     Perception     Praxis      Cognition Arousal/Alertness: Awake/alert Behavior During Therapy: Flat affect Overall Cognitive Status: Impaired/Different from baseline                                  General Comments: patient was able to converse with therapist with patient having continued confusion and difficulty following commands.          Exercises Other Exercises Other Exercises: patient participated in gentle ROM of RUE with patient reporting pain with attempts to move R shoudler limiting ROM.   Shoulder Instructions       General Comments      Pertinent Vitals/ Pain       Pain Assessment: Faces Faces Pain Scale: Hurts a little bit Pain Location: all over Pain Descriptors / Indicators: Grimacing;Discomfort Pain Intervention(s): Monitored during session  Home Living                                          Prior Functioning/Environment              Frequency  Min 2X/week        Progress Toward Goals  OT Goals(current goals can now be found in the care plan section)     Acute Rehab OT Goals OT Goal Formulation: Patient unable to participate in goal setting Time For Goal Achievement: 01/07/22 Potential to Achieve Goals: Poor  Plan      Co-evaluation                 AM-PAC OT "6 Clicks" Daily Activity     Outcome Measure   Help from another person eating meals?: A Lot Help from another person taking care of personal grooming?: A Lot Help from another person toileting, which includes using toliet, bedpan, or urinal?: Total Help from another person bathing (including washing, rinsing, drying)?: Total Help from another person to put on and taking off regular upper body clothing?: Total Help from another person to put on and taking off regular lower body clothing?: Total 6 Click Score: 8    End of Session    OT Visit Diagnosis: Unsteadiness on feet (R26.81);Other symptoms and signs involving cognitive function;Pain;History of falling (Z91.81);Repeated falls (R29.6);Muscle weakness (generalized) (M62.81)   Activity Tolerance Patient tolerated treatment well   Patient Left in bed;with  bed alarm set;with nursing/sitter in room   Nurse Communication Other (comment) (concerns over mouth hygiene and intake)        Time: 4356-8616 OT Time Calculation (min): 32 min  Charges: OT General Charges $OT Visit: 1 Visit OT Treatments $Self Care/Home Management : 8-22 mins $Therapeutic Activity: 8-22 mins  Jackelyn Poling OTR/L, MS Acute Rehabilitation Department Office# [phone] Pager# [phone]   Marcellina Millin 01/01/2022, 3:32 PM

## 2022-01-01 NOTE — Plan of Care (Signed)
°  Problem: Health Behavior/Discharge Planning: Goal: Ability to manage health-related needs will improve Outcome: Progressing   Problem: Clinical Measurements: Goal: Ability to maintain clinical measurements within normal limits will improve Outcome: Progressing Goal: Will remain free from infection Outcome: Progressing Goal: Diagnostic test results will improve Outcome: Progressing Goal: Respiratory complications will improve Outcome: Progressing Goal: Cardiovascular complication will be avoided Outcome: Progressing   Problem: Activity: Goal: Risk for activity intolerance will decrease Outcome: Progressing   Problem: Nutrition: Goal: Adequate nutrition will be maintained Outcome: Progressing   Problem: Coping: Goal: Level of anxiety will decrease Outcome: Progressing   Problem: Elimination: Goal: Will not experience complications related to bowel motility Outcome: Progressing   Problem: Pain Managment: Goal: General experience of comfort will improve Outcome: Progressing   Problem: Safety: Goal: Ability to remain free from injury will improve Outcome: Progressing   Problem: Skin Integrity: Goal: Risk for impaired skin integrity will decrease Outcome: Progressing   Problem: Education: Goal: Required Educational Video(s) Outcome: Progressing   Problem: Clinical Measurements: Goal: Ability to maintain clinical measurements within normal limits will improve Outcome: Progressing Goal: Postoperative complications will be avoided or minimized Outcome: Progressing

## 2022-01-01 NOTE — Progress Notes (Signed)
PHARMACY - TOTAL PARENTERAL NUTRITION CONSULT NOTE   Indication:  Inability to meet enteral needs  Patient Measurements: Height: 5\' 10"  (177.8 cm) Weight: 73.3 kg (161 lb 9.6 oz) IBW/kg (Calculated) : 68.5 TPN AdjBW (KG): 79.8 Body mass index is 23.19 kg/m. Usual Weight: 80 kg  Assessment: 66 y.o. 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 She is now extended right colectomy and ileostomy. She developed acute metabolic encephalopathy post-op. She was initiated on TF, but tube became clogged on 12/8 and patient refused replacement, and is not eating enough to maintain nutrition.  Calorie count was extended to end 1/5 PM as patient did not consume 3 full meals on 1/4.  CCS discussing possibility of G tube with family. CCS ordered TPN to start on 12/12, pharmacy to manage.  Glucose / Insulin: No hx DM.   - on sSSI TIDac and qhs (used 2 units in the past 24 hrs) - CBGs: 111-145 Electrolytes:  CorrCa elevated at 12.28 (no Ca in TPN) - other lytes wnl Renal: SCr <1 stable, BUN WNL Hepatic: LFTs WNL, Albumin low Trig: 60 (1/2) Intake / Output: + 353 mL - UOP: 300 mL - stool:  1100 ml MIVF: None GI Imaging:  - 12/27 abd CT: Postsurgical changes with a right lower quadrant ileostomy and left colostomy. No bowel obstruction. 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 Interval decrease in the size of the fluid density inferior to the gallbladder. No drainable fluid collection Identified. Complex collection in the deep subcutaneous soft tissues of theanterior abdominal wall along the midline extending to the skin at the level of the umbilicus - 1/1 abd CT: There is again noted wall thickening and inflammatory changes involving small bowel  loops in the right lower quadrant near the ileostomy suggesting enteritis with surrounding inflammation GI Surgeries / Procedures:  11/27: Expiratory laparotomy, extended right colectomy, end ileostomy  Central access: PICC placed 12/11 TPN start date: 12/12  Nutritional Goals: RD Assessment: Estimated Needs Total Energy Estimated Needs: 1950-2150 Total Protein Estimated Needs: 95-105g Total Fluid Estimated Needs: 2L/day  Current Nutrition:  Regular diet- Minimal intake, patient noted to be pocketing food and will spit out Ensure Enlive TID (20 g of protein and 350 kcal) *Intake not consistent Juven supplement packet (2.5g of protein and 95 kcal) *Intake not consistent Half rate cyclic TPN - 1,779 mL provides 54g protein and 1094 kcal per day. Attempting to stimulate appetite during the day.    Plan:   At 39:03:  Continue cyclic TPN giving ~00% of nutritional needs. Infuse 1,000 mL over 14 hrs: 38 mL/hr x 1 hr, then 77 mL/hr x 12 hrs, then 38 mL/hr x 1 hr. Electrolytes in TPN. Na 15mEq/L K 70 mEq/L Ca 0 mEq/L Mg 7 mEq/L Phos 20 mmol/L Cl:Ac max Ac With good glycemic control, will d/c SSI sensitive achs Continue multivitamin PO Thiamine per neurology Monitor TPN labs on Mon/Thurs  Dia Sitter, PharmD, BCPS 01/01/2022 6:59 AM

## 2022-01-01 NOTE — Progress Notes (Signed)
PT Cancellation Note  Patient Details Name: Sherry Hampton MRN: [id number] DOB: 03/14/1956   Cancelled Treatment:    Reason Eval/Treat Not Completed: Pain limiting ability to participate;Medical issues which prohibited therapy (pt is saturated in urine, will check back after she's cleaned up. Noted pt's tongue, lips, L fingers are covered in a very dark black substance. RN notified. Attempted BLE exercises however pt moaned in pain with minimal movement. Will follow.)  Philomena Doheny PT 01/01/2022  Acute Rehabilitation Services Pager [phone] Office [phone]

## 2022-01-01 NOTE — Progress Notes (Signed)
PT Cancellation Note  Patient Details Name: Sherry Hampton MRN: [id number] DOB: 06/06/1956   Cancelled Treatment:    Reason Eval/Treat Not Completed: Pain limiting ability to participate (pt yelled in pain with light touch to her LLE, refused to attempt mobility. Will follow.)   Philomena Doheny PT 01/01/2022  Acute Rehabilitation Services Pager [phone] Office [phone]

## 2022-01-01 NOTE — Progress Notes (Signed)
Progress Note  45 Days Post-Op  Subjective: Long discussion with patient's sister yesterday. I spoke with her brother who has been the primary point of contact as well in regards to family decision about feeding access. At this time family is refusing PEG placement. Patient denies pain this AM. She denies nausea or vomiting. She still has poor appetite.   Objective: Vital signs in last 24 hours: Temp:  [98.1 F (36.7 C)-98.9 F (37.2 C)] 98.9 F (37.2 C) (01/11 0501) Pulse Rate:  [94-105] 101 (01/11 0501) Resp:  [16-19] 16 (01/11 0501) BP: (97-107)/(66-69) 103/69 (01/11 0501) SpO2:  [97 %-100 %] 100 % (01/11 0501) Last BM Date: 01/01/22  Intake/Output from previous day: 01/10 0701 - 01/11 0700 In: 1753.6 [P.O.:860; I.V.:893.6] Out: 1400 [Urine:300; Stool:1100] Intake/Output this shift: No intake/output data recorded.  PE: General: resting, NAD Resp: normal work of breathing Abdomen: soft, nondistended, nontender to palpation. Midline incision with blue-green purulent drainage noted. Ileostomy productive of loose stool. 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 Extremities: warm and well-perfused Neuro: FC, RUE maybe a little weaker but difficult to assess   Lab Results:  Recent Labs    12/30/21 1048  WBC 13.5*  HGB 9.4*  HCT 30.2*  PLT 408*   BMET Recent Labs    12/31/21 0303 01/01/22 0449  NA 145 142  K 4.0 3.7  CL 111 109  CO2 30 28  GLUCOSE 152* 142*  BUN 24* 31*  CREATININE 0.46 0.33*  CALCIUM 11.1* 10.6*   PT/INR No results for input(s): LABPROT, INR in the last 72 hours. CMP     Component Value Date/Time   NA 142 01/01/2022 0449   K 3.7 01/01/2022 0449   CL 109 01/01/2022 0449   CO2 28 01/01/2022 0449   GLUCOSE 142 (H) 01/01/2022 0449   BUN 31 (H) 01/01/2022 0449   CREATININE 0.33 (L) 01/01/2022 0449   CALCIUM 10.6 (H) 01/01/2022 0449   PROT 7.6 12/30/2021 0550   ALBUMIN 1.8 (L) 01/01/2022 0449   AST 16 12/30/2021 0550   ALT 15  12/30/2021 0550   ALKPHOS 88 12/30/2021 0550   BILITOT 0.3 12/30/2021 0550   GFRNONAA >60 01/01/2022 0449   Lipase     Component Value Date/Time   LIPASE 22 11/17/2021 0955       Studies/Results: No results found.  Anti-infectives: Anti-infectives (From admission, onward)    Start     Dose/Rate Route Frequency Ordered Stop   11/25/21 1000  piperacillin-tazobactam (ZOSYN) IVPB 3.375 g  Status:  Discontinued        3.375 g 12.5 mL/hr over 240 Minutes Intravenous Every 8 hours 11/25/21 0909 12/01/21 0822   11/17/21 1830  piperacillin-tazobactam (ZOSYN) IVPB 3.375 g        3.375 g 12.5 mL/hr over 240 Minutes Intravenous Every 8 hours 11/17/21 1415 11/22/21 2359   11/17/21 1230  piperacillin-tazobactam (ZOSYN) IVPB 3.375 g        3.375 g 100 mL/hr over 30 Minutes Intravenous  Once 11/17/21 1220 11/17/21 1304        Assessment/Plan 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 Harlow Asa 11/27 - path: necrotic colon at site of perforation, no malignancy or dysplasia noted  - Ileostomy output improved. Continue BID metamucil.  - WOC following for ileostomy, mucous fistula - Wound with blue-green, musty smelling drainage this AM - Dakin's TID x72 hrs  - Pathology benign. CT distal  descending/proximal sigmoid bowel luminal collapse.   FEN - Dysphagia 2 diet as tolerated (brother reports patient has difficulty chewing anything that is not soft or pureed).  Protein supplements per dietitian. G tube may be necessary to get adequate nutrition - family currently refusing this.    VTE - SCDs, Lovenox ID - [email or web address] 11/27 - 12/2. 12/5-12/11 Foley - None currently    - encephalopathy/FTT - appreciate consultation at request of Dr. Johnnye Sima. Neurology saw a few weeks ago and signed off. On folic acid and thiamine supplementation. Family requesting further neurologic testing - appreciate Neuro seeing again.  Appreciate TRH following as well.    - psych saw 12/27 and determined patient does not have capacity   - frequent urination - wick discontinued secondary to pressure wounds but per nursing patient is urinating multiple times per hr and has difficulty moving secondary to pain in legs. Recent urine culture with multiple species present - UA 1/10 unremarkable, Urine Cx pending    Dispo: Patient will need SNF placement at discharge. Appreciate case management sending documents to [facility] in [place].  We will continue to work on disposition with case management and family. Considering ethics consult at this point given prolonged course and difficult disposition.   LOS: 45 days   I reviewed nursing notes, Consultant Neurology notes, hospitalist notes, last 24 h vitals and pain scores, last 48 h intake and output, and last 24 h labs and trends.  This care required moderate level of medical decision making.    Norm Parcel, [facility] 01/01/2022, 10:11 AM Please see Amion for pager number during day hours 7:00am-4:30pm

## 2022-01-01 NOTE — Progress Notes (Signed)
Pharmacy Antibiotic Note  Sherry Hampton is a 66 y.o. female admitted on 11/17/2021 with perforated viscus.  Pharmacy has been consulted for Zosyn dosing.  Plan: Zosyn 3.375g IV q8h (4 hour infusion). No dose adjustments needed, Pharmacy will sign off  Height: 5\' 10"  (177.8 cm) Weight: 73.3 kg (161 lb 9.6 oz) IBW/kg (Calculated) : 68.5  Temp (24hrs), Avg:98.2 F (36.8 C), Min:97.5 F (36.4 C), Max:98.9 F (37.2 C)  Recent Labs  Lab 12/28/21 0221 12/29/21 0630 12/30/21 0550 12/30/21 1048 12/31/21 0303 01/01/22 0449  WBC  --   --   --  13.5*  --   --   CREATININE 0.37* 0.38* 0.43*  --  0.46 0.33*    Estimated Creatinine Clearance: 73.8 mL/min (A) (by C-G formula based on SCr of 0.33 mg/dL (L)).    No Known Allergies  Antimicrobials this admission: 11/27 Zosyn>> 12/2, resume 12/5>>12/11, resume 1/11>>  Dose adjustments this admission:  Microbiology results: 12/7 UCx ngF 12/30 bcx fungus: neg FINAL 12/30 bcx x2: neg FINAL 1/10 UCx: reincubate  Thank you for allowing pharmacy to be a part of this patients care.  Peggyann Juba, PharmD, BCPS Pharmacy: [phone] 01/01/2022 8:35 PM

## 2022-01-01 NOTE — Progress Notes (Addendum)
PROGRESS NOTE    Sherry [facility]  DXI:[id number] DOB: 23-Sep-1956 DOA: 11/17/2021 PCP: Pcp, No  No chief complaint on file.   Brief Narrative:  66 year old woman according to chart was independent, living alone and driving and continue to work as needed.  PMH including hyperlipidemia, possibly some memory loss, presented 11/27 with sudden abdominal pain.  Underwent emergent surgery for perforated viscus of the ascending colon with fecal peritonitis same day.  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  Hospitalization has been complicated by fluid collections in the abdomen, malnutrition, currently on TNA, intermittent encephalopathy and failure to thrive and progressive decline in strength and ability to participate with physical therapy.    See previous notes    Assessment & Plan:   Principal Problem:   Perforated abdominal viscus Active Problems:   Acute metabolic encephalopathy   Abnormal neurological exam   Hypercalcemia   Protein-calorie malnutrition, severe   Memory loss   Delirium   GERD (gastroesophageal reflux disease)   Hyperlipidemia   Hyponatremia   Acute blood loss anemia   Necrosis of colon with perforation    Ileostomy in place Sherry [facility])   * Perforated abdominal viscus- (present on admission) --per surgery S/p ex lap, extended right colectomy, end ileostomy, mucous fistula 11/17/2021 Wound draining some purulent discharge - surgery planning for dakins, follow closely, low threshold for additional imaging - per surgery Last CT 12/22/2021 with fluid collections in RLQ (possible abscesses) as well as in midline surgical incision (? Small abscess)   Acute metabolic encephalopathy- (present on admission) alert, but obviously confused - poor attention Follows commands inconsistently, difficult exam  Exam was very difficult.  Mentation appears well.  See my initial consult note for review of  the chart and extensive work-up.  No metabolic or extra-abdominal process identified. Previously with normal ammonia, TSH, HIV MRI 12/21 without acute abnormality Neurology evaluated and delirium thought 2/2 prolonged hospitalization, sleep wake disturbance, nutritional def, critical illness Delirium precautions    Abnormal neurological exam Exam difficult, ? RUE weakness on my exam today, thought inconsistent Seen by neurology yesterday, apparently patient refused exam  MRI c spine recommended 12/21, will obtain this now Will follow copper and comprehensive vitamin panel (will discuss with neurology specifics regarding this testing) EMG/NCS will need to be outpatient   Hypercalcemia Corrects to 12.4, relatively mild Start IVF and follow PTH ? Related to TNA  Protein-calorie malnutrition, severe -- Continue nutrition as per surgery  Memory loss -- According to the chart mild prior to admission, patient was independent and still working at times    DVT prophylaxis: lovenox Code Status: full Family Communication: none at bedside Disposition: per general surgery       Consultants:  General surgery is primary  Procedures:  Exploratory laparotomy, extended right colectomy, end ileostomy [name]), and mucous fistula (distal transverse colon) 11/17/2021  Antimicrobials:  Anti-infectives (From admission, onward)    Start     Dose/Rate Route Frequency Ordered Stop   11/25/21 1000  piperacillin-tazobactam (ZOSYN) IVPB 3.375 g  Status:  Discontinued        3.375 g 12.5 mL/hr over 240 Minutes Intravenous Every 8 hours 11/25/21 0909 12/01/21 0822   11/17/21 1830  piperacillin-tazobactam (ZOSYN) IVPB 3.375 g        3.375 g 12.5 mL/hr over 240 Minutes Intravenous Every 8 hours 11/17/21 1415 11/22/21 2359   11/17/21 1230  piperacillin-tazobactam (ZOSYN) IVPB 3.375 g  3.375 g 100 mL/hr over 30 Minutes Intravenous  Once 11/17/21 1220 11/17/21 1304        Subjective: Tangential speech, poor focus/attention - responds appropriately at times, but inconsistent  Objective: Vitals:   12/31/21 1319 12/31/21 2110 01/01/22 0501 01/01/22 1400  BP: 107/68 97/66 103/69 114/70  Pulse: (!) 105 94 (!) 101 (!) 105  Resp: 19 16 16 18   Temp: 98.1 F (36.7 C) 98.1 F (36.7 C) 98.9 F (37.2 C) (!) 97.5 F (36.4 C)  TempSrc: Oral Oral  Oral  SpO2: 97% 99% 100% 98%  Weight:      Height:        Intake/Output Summary (Last 24 hours) at 01/01/2022 1455 Last data filed at 01/01/2022 1400 Gross per 24 hour  Intake 1613.59 ml  Output 950 ml  Net 663.59 ml   Filed Weights   12/17/21 0500 12/24/21 0500 12/30/21 0635  Weight: 76.5 kg 77.9 kg 73.3 kg    Examination:  General exam: Appears calm and comfortable  Respiratory system: unlabored Cardiovascular system: RRR Gastrointestinal system: purulent discharge from surgical wound Central nervous system: inconsistent exam, alert, but tangential, poor attention.  Follows commands inconsistently.  ? RUE weakness, did not withdraw from pain, but seen later moving this arm spontaneously - difficult to exam LE's as she yells to light touch to her feet   Data Reviewed: I have personally reviewed following labs and imaging studies  CBC: Recent Labs  Lab 12/30/21 1048  WBC 13.5*  HGB 9.4*  HCT 30.2*  MCV 88.8  PLT 408*    Basic Metabolic Panel: Recent Labs  Lab 12/28/21 0221 12/29/21 0630 12/30/21 0550 12/31/21 0303 01/01/22 0449  NA 139 141 144 145 142  K 3.1* 4.2 3.8 4.0 3.7  CL 106 109 111 111 109  CO2 28 28 28 30 28   GLUCOSE 144* 141* 155* 152* 142*  BUN 21 23 22  24* 31*  CREATININE 0.37* 0.38* 0.43* 0.46 0.33*  CALCIUM 10.1 10.3 10.8* 11.1* 10.6*  MG 1.9 2.2 2.2 2.2 2.1  PHOS 2.7 3.0 3.1 3.1 2.6    GFR: Estimated Creatinine Clearance: 73.8 mL/min (Sherry Hampton) (by C-G formula based on SCr of 0.33 mg/dL (L)).  Liver Function Tests: Recent Labs  Lab 12/26/21 0348 12/30/21 0550  01/01/22 0449  AST 16 16  --   ALT 15 15  --   ALKPHOS 82 88  --   BILITOT 0.5 0.3  --   PROT 7.4 7.6  --   ALBUMIN 2.0* 1.9* 1.8*    CBG: Recent Labs  Lab 12/31/21 1742 12/31/21 2216 01/01/22 0305 01/01/22 0724 01/01/22 1134  GLUCAP 111* 145* 139* 126* 121*     Recent Results (from the past 240 hour(s))  Urine Culture     Status: None (Preliminary result)   Collection Time: 12/31/21 10:48 AM   Specimen: In/Out Cath Urine  Result Value Ref Range Status   Specimen Description   Final    IN/OUT CATH URINE Performed at [facility], [place] [address] [place], [place] [address]    Special Requests   Final    NONE Performed at [facility], [place] [address], [place], [place] [address]    Culture   Final    CULTURE REINCUBATED FOR BETTER GROWTH Performed at [facility], [place] [address], [place],  [address]    Report Status PENDING  Incomplete         Radiology Studies: No results found.  Scheduled Meds:  acetaminophen (TYLENOL) oral liquid 160 mg/5 mL  1,000 mg Oral Q6H   alteplase  2 mg Intracatheter Once   chlorhexidine  15 mL Mouth Rinse BID   Chlorhexidine Gluconate Cloth  6 each Topical Daily   enoxaparin (LOVENOX) injection  40 mg Subcutaneous Q24H   feeding supplement  237 mL Oral TID BM   ferrous sulfate  325 mg Oral BID WC   folic acid  1 mg Oral Daily   mouth rinse  15 mL Mouth Rinse q12n4p   mirtazapine  15 mg Oral QHS   multivitamin with minerals  1 tablet Oral Daily   nutrition supplement (JUVEN)  1 packet Oral BID BM   pantoprazole  40 mg Oral QHS   psyllium  1 packet Oral BID   sodium chloride flush  10-40 mL Intracatheter Q12H   sodium hypochlorite   Irrigation TID   thiamine  100 mg Oral BID   Continuous Infusions:  sodium chloride Stopped (12/24/21 1137)   sodium chloride 50 mL/hr at 82/70/78 6754   TPN CYCLIC-ADULT (ION)       LOS: 45 days    Time spent: over 30  min    Fayrene Helper, MD [facility]   To contact the attending provider between 7A-7P or the covering provider during after hours 7P-7A, please log into the web site [email or web address] and access using universal [place] password for that web site. If you do not have the password, please call the [facility] operator.  01/01/2022, 2:55 PM

## 2022-01-01 NOTE — Progress Notes (Signed)
Noted this am that pt has a chalky blackish coating on her tongue, teeth, lips and even on a few of her fingers. Pt has had no nausea/vomiting, this is not coffee-ground material. Attempted oral care several times but pt unable to cooperate. Not sure source of this material, MD made aware of. Will cont to monitor.

## 2022-01-02 ENCOUNTER — Inpatient Hospital Stay (HOSPITAL_COMMUNITY): Payer: Medicare HMO

## 2022-01-02 DIAGNOSIS — B952 Enterococcus as the cause of diseases classified elsewhere: Secondary | ICD-10-CM

## 2022-01-02 DIAGNOSIS — N390 Urinary tract infection, site not specified: Secondary | ICD-10-CM

## 2022-01-02 DIAGNOSIS — E538 Deficiency of other specified B group vitamins: Secondary | ICD-10-CM

## 2022-01-02 DIAGNOSIS — K838 Other specified diseases of biliary tract: Secondary | ICD-10-CM

## 2022-01-02 LAB — COMPREHENSIVE METABOLIC PANEL
ALT: 21 U/L (ref 0–44)
AST: 21 U/L (ref 15–41)
Albumin: 1.9 g/dL — ABNORMAL LOW (ref 3.5–5.0)
Alkaline Phosphatase: 91 U/L (ref 38–126)
Anion gap: 5 (ref 5–15)
BUN: 25 mg/dL — ABNORMAL HIGH (ref 8–23)
CO2: 26 mmol/L (ref 22–32)
Calcium: 10.3 mg/dL (ref 8.9–10.3)
Chloride: 110 mmol/L (ref 98–111)
Creatinine, Ser: 0.56 mg/dL (ref 0.44–1.00)
GFR, Estimated: >60 mL/min (ref >60)
Glucose, Bld: 150 mg/dL — ABNORMAL HIGH (ref 70–99)
Potassium: 3.5 mmol/L (ref 3.5–5.1)
Sodium: 141 mmol/L (ref 135–145)
Total Bilirubin: 0.4 mg/dL (ref 0.3–1.2)
Total Protein: 7.4 g/dL (ref 6.5–8.1)

## 2022-01-02 LAB — CBC WITH DIFFERENTIAL/PLATELET
Abs Immature Granulocytes: 0.05 10*3/uL (ref 0.00–0.07)
Abs Immature Granulocytes: 0.07 10*3/uL (ref 0.00–0.07)
Basophils Absolute: 0.1 10*3/uL (ref 0.0–0.1)
Basophils Absolute: 0 10*3/uL (ref 0.0–0.1)
Basophils Relative: 0 %
Basophils Relative: 0 %
Eosinophils Absolute: 0.2 10*3/uL (ref 0.0–0.5)
Eosinophils Absolute: 0.4 10*3/uL (ref 0.0–0.5)
Eosinophils Relative: 2 %
Eosinophils Relative: 4 %
HCT: 28.6 % — ABNORMAL LOW (ref 36.0–46.0)
HCT: 27.2 % — ABNORMAL LOW (ref 36.0–46.0)
Hemoglobin: 8.7 g/dL — ABNORMAL LOW (ref 12.0–15.0)
Hemoglobin: 8.1 g/dL — ABNORMAL LOW (ref 12.0–15.0)
Immature Granulocytes: 0 %
Immature Granulocytes: 1 %
Lymphocytes Relative: 12 %
Lymphocytes Relative: 7 %
Lymphs Abs: 1.4 10*3/uL (ref 0.7–4.0)
Lymphs Abs: 0.7 10*3/uL (ref 0.7–4.0)
MCH: 27.3 pg (ref 26.0–34.0)
MCH: 26.7 pg (ref 26.0–34.0)
MCHC: 30.4 g/dL (ref 30.0–36.0)
MCHC: 29.8 g/dL — ABNORMAL LOW (ref 30.0–36.0)
MCV: 89.7 fL (ref 80.0–100.0)
MCV: 89.8 fL (ref 80.0–100.0)
Monocytes Absolute: 1 10*3/uL (ref 0.1–1.0)
Monocytes Absolute: 0.7 10*3/uL (ref 0.1–1.0)
Monocytes Relative: 8 %
Monocytes Relative: 7 %
Neutro Abs: 9.3 10*3/uL — ABNORMAL HIGH (ref 1.7–7.7)
Neutro Abs: 7.9 10*3/uL — ABNORMAL HIGH (ref 1.7–7.7)
Neutrophils Relative %: 78 %
Neutrophils Relative %: 81 %
Platelets: 343 10*3/uL (ref 150–400)
Platelets: 306 10*3/uL (ref 150–400)
RBC: 3.19 MIL/uL — ABNORMAL LOW (ref 3.87–5.11)
RBC: 3.03 MIL/uL — ABNORMAL LOW (ref 3.87–5.11)
RDW: 16.7 % — ABNORMAL HIGH (ref 11.5–15.5)
RDW: 16.8 % — ABNORMAL HIGH (ref 11.5–15.5)
WBC: 12 10*3/uL — ABNORMAL HIGH (ref 4.0–10.5)
WBC: 9.9 10*3/uL (ref 4.0–10.5)
nRBC: 0 % (ref 0.0–0.2)
nRBC: 0 % (ref 0.0–0.2)

## 2022-01-02 LAB — GLUCOSE, CAPILLARY
Glucose-Capillary: 125 mg/dL — ABNORMAL HIGH (ref 70–99)
Glucose-Capillary: 106 mg/dL — ABNORMAL HIGH (ref 70–99)
Glucose-Capillary: 104 mg/dL — ABNORMAL HIGH (ref 70–99)

## 2022-01-02 LAB — URINE CULTURE: Culture: >=100000 — AB

## 2022-01-02 LAB — AMMONIA: Ammonia: 15 umol/L (ref 9–35)

## 2022-01-02 LAB — MAGNESIUM: Magnesium: 2.1 mg/dL (ref 1.7–2.4)

## 2022-01-02 LAB — PTH, INTACT AND CALCIUM
Calcium, Total (PTH): 10.9 mg/dL — ABNORMAL HIGH (ref 8.7–10.3)
PTH: 44 pg/mL (ref 15–65)

## 2022-01-02 LAB — PHOSPHORUS: Phosphorus: 3.1 mg/dL (ref 2.5–4.6)

## 2022-01-02 LAB — VITAMIN D 25 HYDROXY (VIT D DEFICIENCY, FRACTURES): Vit D, 25-Hydroxy: 46.79 ng/mL (ref 30–100)

## 2022-01-02 MED ORDER — FLUMAZENIL 0.5 MG/5ML IV SOLN
0.3000 mg | Freq: Once | INTRAVENOUS | Status: AC
  Administered 2022-01-02: 0.3 mg via INTRAVENOUS
  Filled 2022-01-02: qty 5

## 2022-01-02 MED ORDER — TRAVASOL 10 % IV SOLN
INTRAVENOUS | Status: AC
  Filled 2022-01-02: qty 540

## 2022-01-02 MED ORDER — NIACIN 100 MG PO TABS
100.0000 mg | ORAL_TABLET | Freq: Every day | ORAL | Status: DC
  Administered 2022-01-03 – 2022-01-20 (×12): 100 mg via ORAL
  Filled 2022-01-02 (×17): qty 1

## 2022-01-02 MED ORDER — POTASSIUM CHLORIDE 10 MEQ/100ML IV SOLN
10.0000 meq | INTRAVENOUS | Status: AC
  Administered 2022-01-02 (×2): 10 meq via INTRAVENOUS
  Filled 2022-01-02 (×2): qty 100

## 2022-01-02 MED ORDER — LORAZEPAM 2 MG/ML IJ SOLN
0.5000 mg | Freq: Once | INTRAMUSCULAR | Status: DC | PRN
  Administered 2022-01-02: 0.5 mg via INTRAVENOUS
  Filled 2022-01-02: qty 1

## 2022-01-02 NOTE — Progress Notes (Signed)
Abdominal wound changed per order, pt tolerated well.

## 2022-01-02 NOTE — Assessment & Plan Note (Signed)
supplementation

## 2022-01-02 NOTE — Progress Notes (Signed)
Progress Note  46 Days Post-Op  Subjective: Pt tired this AM. She denies abdominal pain.   Objective: Vital signs in last 24 hours: Temp:  [97.5 F (36.4 C)-98.7 F (37.1 C)] 97.5 F (36.4 C) (01/12 0542) Pulse Rate:  [88-105] 88 (01/12 0542) Resp:  [16-18] 16 (01/12 0542) BP: (99-114)/(64-70) 99/67 (01/12 0542) SpO2:  [98 %-99 %] 99 % (01/12 0542) Last BM Date: 01/01/22  Intake/Output from previous day: 01/11 0701 - 01/12 0700 In: 3450.2 [P.O.:1900; I.V.:1520.5; IV Piggyback:29.7] Out: 1200 [Urine:200; Stool:1000] Intake/Output this shift: Total I/O In: -  Out: 300 [Urine:250; Stool:50]  PE: General: resting, NAD Resp: normal work of breathing Abdomen: soft, nondistended, nontender to palpation. Midline incision with purulent drainage noted, wound probed and 8.5 cm tract in inferior wound that extends inferiolaterally toward ileostomy, 2.5 cm tract in superior wound that extends superiorly. Ileostomy productive of oatmeal consistency stool. 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 Extremities: warm and well-perfused Neuro: responds but speech is slowed and she followed some commands for me this AM.    Lab Results:  Recent Labs    01/01/22 2257 01/02/22 0356  WBC 11.2* 12.0*  HGB 8.9* 8.7*  HCT 29.5* 28.6*  PLT 363 343   BMET Recent Labs    01/01/22 0449 01/02/22 0356  NA 142 141  K 3.7 3.5  CL 109 110  CO2 28 26  GLUCOSE 142* 150*  BUN 31* 25*  CREATININE 0.33* 0.56  CALCIUM 10.6* 10.3   PT/INR No results for input(s): LABPROT, INR in the last 72 hours. CMP     Component Value Date/Time   NA 141 01/02/2022 0356   K 3.5 01/02/2022 0356   CL 110 01/02/2022 0356   CO2 26 01/02/2022 0356   GLUCOSE 150 (H) 01/02/2022 0356   BUN 25 (H) 01/02/2022 0356   CREATININE 0.56 01/02/2022 0356   CALCIUM 10.3 01/02/2022 0356   PROT 7.4 01/02/2022 0356   ALBUMIN 1.9 (L) 01/02/2022 0356   AST 21 01/02/2022 0356   ALT 21 01/02/2022 0356   ALKPHOS 91  01/02/2022 0356   BILITOT 0.4 01/02/2022 0356   GFRNONAA >60 01/02/2022 0356   Lipase     Component Value Date/Time   LIPASE 22 11/17/2021 0955       Studies/Results: CT ABDOMEN PELVIS W CONTRAST  Result Date: 01/01/2022 CLINICAL DATA:  Postoperative abdominal pain, altered mental status. EXAM: CT ABDOMEN AND PELVIS WITH CONTRAST TECHNIQUE: Multidetector CT imaging of the abdomen and pelvis was performed using the standard protocol following bolus administration of intravenous contrast. CONTRAST:  46mL OMNIPAQUE IOHEXOL 350 MG/ML SOLN COMPARISON:  CT abdomen and pelvis 12/22/2021. FINDINGS: Lower chest: There is some atelectasis the lung bases similar to the prior study. Hepatobiliary: Common bile duct is enlarged measuring 1 cm, mild mildly increased from prior. No calcified gallstones are seen. The liver is otherwise within normal limits. Pancreas: Unremarkable. No pancreatic ductal dilatation or surrounding inflammatory changes. Spleen: Normal in size without focal abnormality. Adrenals/Urinary Tract: There are stable nonobstructing left lower pole renal calculi measuring 3 mm. No hydronephrosis or perinephric fluid. Rounded hypodensity in the left kidney is too small to characterize but favored as a cyst, unchanged. The bladder and adrenal glands are within normal limits. Stomach/Bowel: Left lower quadrant colostomy is again seen which appears within normal limits. There is diffuse colonic diverticulosis without evidence for acute diverticulitis. Right lower quadrant ileostomy is again noted. There is some extravasated oral contrast in the subcutaneous abdominal  wall superior and lateral to the ostomy at this level. There is inflammatory tissue at the level of the ostomy extending superiorly abutting the inferior tip of the liver and fundus of the gallbladder similar to the prior examination. There is likely some early fluid collection development anterior to the right kidney image 7/31 measuring  1.9 by 1.4 by 3.7 cm. Previously described fluid collections near the ostomy are not as well defined on the current study. Remaining small bowel loops and stomach are nondilated. Appendix not seen. Vascular/Lymphatic: There is flattening of the IVC which can be seen with dehydration. Aorta is normal in size. There are no enlarged lymph nodes identified. Reproductive: Calcified uterine fibroids are again noted measuring up to 4.1 cm. No adnexal mass. Other: Ill-defined omental inflammation and fluid in the lower abdomen and left upper quadrant appears unchanged and may be related to omental infarct. There is no free intraperitoneal air or ascites. Small air-fluid collection at the level of the umbilicus is again seen containing air. This collection measures 2.5 x 0.7 by 1.3 cm and has mildly increased in size. Air in the collection is new from prior. Musculoskeletal: No acute fractures. Degenerative changes affect the spine. IMPRESSION: 1. Right lower quadrant ileostomy present. There is extravasation of oral contrast into subcutaneous abdominal wall at the level of the ostomy worrisome for bowel leak. 2. Inflammatory tissue in the right abdomen at the level of the ostomy extending to the inferior tip the liver has mildly increased. There is new likely early fluid collection development anterior to the right kidney worrisome for phlegmon or abscess. 3. Subcutaneous fluid collection at the level of the umbilicus has increased in size and now contains air worrisome for abscess. 4. Stable nonobstructing left renal calculi. 5. Common bile duct dilatation measuring 1 cm, indeterminate. Recommend correlation with lab values. Consider follow-up ultrasound. Electronically Signed   By: Ronney Asters M.D.   On: 01/01/2022 19:25    Anti-infectives: Anti-infectives (From admission, onward)    Start     Dose/Rate Route Frequency Ordered Stop   01/01/22 2100  piperacillin-tazobactam (ZOSYN) IVPB 3.375 g        3.375 g 12.5  mL/hr over 240 Minutes Intravenous Every 8 hours 01/01/22 2042     11/25/21 1000  piperacillin-tazobactam (ZOSYN) IVPB 3.375 g  Status:  Discontinued        3.375 g 12.5 mL/hr over 240 Minutes Intravenous Every 8 hours 11/25/21 0909 12/01/21 0822   11/17/21 1830  piperacillin-tazobactam (ZOSYN) IVPB 3.375 g        3.375 g 12.5 mL/hr over 240 Minutes Intravenous Every 8 hours 11/17/21 1415 11/22/21 2359   11/17/21 1230  piperacillin-tazobactam (ZOSYN) IVPB 3.375 g        3.375 g 100 mL/hr over 30 Minutes Intravenous  Once 11/17/21 1220 11/17/21 1304        Assessment/Plan 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 Harlow Asa 11/27 - path: necrotic colon at site of perforation, no malignancy or dysplasia noted  - Ileostomy output improved. Continue BID metamucil.  - WOC following for ileostomy, mucous fistula - Wound with blue-green, musty smelling drainage 1/11 - Dakin's TID x72 hrs. Wound probed and deeper tract opened after CT yesterday with return of purulent drainage.   - Pathology benign. CT distal descending/proximal sigmoid bowel luminal collapse. - CT 1/11 - some extravasated oral contrast in the subcutaneous abdominal wall superior and lateral to the  ostomy at this level. There is inflammatory tissue at the level of the ostomy extending superiorly abutting the inferior tip of the liver and fundus of the gallbladder similar to the prior examination. There is likely some early fluid collection development anterior to the right kidney image 7/31 measuring 1.9 by 1.4 by 3.7 cm. Previously described fluid collections near the ostomy are not as well defined on the current study. 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 There is no free intraperitoneal air or ascites. Small air-fluid collection at the level of  the umbilicus is again seen containing air. This collection measures 2.5 x 0.7 by 1.3 cm and has mildly increased in size. Air in the collection is new from prior. - no collection around the ileostomy that is drainable - the best way to try to resolve this is abx and improving nutrition   FEN - Dysphagia 2 diet as tolerated (brother reports patient has difficulty chewing anything that is not soft or pureed).  Protein supplements per dietitian. G tube may be necessary to get adequate nutrition - family currently refusing this. Discussed increasing TPN back to full rate with pharmacy for next dose since patient is still not taking in much PO   VTE - SCDs, Lovenox ID - zosyn 11/27>12/2, 12/5>12/11, restarted 1/11>> Foley - None currently    - encephalopathy/FTT - appreciate consultation at request of Dr. Johnnye Sima. Neurology saw a few weeks ago and signed off. On folic acid and thiamine supplementation. Family requesting further neurologic testing - appreciate Neuro seeing again. Appreciate TRH following as well. Vitamin B6, B1 and E levels pending, PTH pending, serum copper level pending.    - psych saw 12/27 and determined patient does not have capacity   - frequent urination - wick discontinued secondary to pressure wounds but per nursing patient is urinating multiple times per hr and has difficulty moving secondary to pain in legs. Recent urine culture with multiple species present - UA 1/10 unremarkable, Urine Cx with enterococcus faecalis sensitive to ampicillin, Zosyn should be adequate coverage   Dispo: Patient will need SNF placement at discharge. Appreciate case management sending documents to [facility] in [place].  We will continue to work on disposition with case management and family. Considering ethics consult at this point given prolonged course and difficult disposition.   LOS: 46 days   I reviewed nursing notes, hospitalist notes, last 24 h vitals and pain scores, last 48 h intake and  output, last 24 h labs and trends, and last 24 h imaging results.  This care required moderate level of medical decision making.    Norm Parcel, [facility] 01/02/2022, 10:59 AM Please see Amion for pager number during day hours 7:00am-4:30pm

## 2022-01-02 NOTE — Progress Notes (Addendum)
Consult NOTE    Sherry Hampton  WJX:[id number] DOB: 1956-07-16 DOA: 11/17/2021 PCP: Pcp, No  No chief complaint on file.   Brief Narrative:  66 year old woman according to chart was independent, living alone and driving and continue to work as needed.  PMH including hyperlipidemia, possibly some memory loss, presented 11/27 with sudden abdominal pain.  Underwent emergent surgery for perforated viscus of the ascending colon with fecal peritonitis same day.  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  Hospitalization has been complicated by fluid collections in the abdomen, malnutrition, currently on TNA, intermittent encephalopathy and failure to thrive and progressive decline in strength and ability to participate with physical therapy.    See previous notes    Assessment & Plan:   Principal Problem:   Perforated abdominal viscus Active Problems:   Common bile duct dilatation   Enterococcus UTI   Acute metabolic encephalopathy   Abnormal neurological exam   Hypercalcemia   Folate deficiency   Protein-calorie malnutrition, severe   Memory loss   Delirium   GERD (gastroesophageal reflux disease)   Hyperlipidemia   Hyponatremia   Acute blood loss anemia   Necrosis of colon with perforation    Ileostomy in place [facility])   * Perforated abdominal viscus- (present on admission) --per surgery S/p ex lap, extended right colectomy, end ileostomy, mucous fistula 11/17/2021 CT abd pelvis 1/12 with extravasation of oral contrast into subcutaneous abdominal wall at level of ostomy concerning for bowel leak, inflammatory tissue in R abdomen at level of ostomy extending to the inferior tip of the liver, early fluid collection concerning for phlegmon or abscess, subcutaneous fluid colelction at level of umbilicus increased in size and with air concerning for abscess abx started 1/11 - present with zosyn Abnormal  imaging findings per surgery -> no collection around ileostomy that is drainable, recommending abx and improving nutrition  Enterococcus UTI On zosyn cx from 12/31/21  Common bile duct dilatation Noted incidentally on CT scan Bili and alk phos wnl Will defer additional imaging to surgery  Acute metabolic encephalopathy- (present on admission) alert, but obviously confused - appears Sherry Hampton little better than yesterday with attention Follows commands inconsistently, difficult exam  Exam was very difficult.  Mentation appears well.  See 12/28/2021 initial consult note Sherry Hampton) for review of the chart and extensive work-up. Previously with normal ammonia, TSH, HIV S/p high dose thiamine, continue supplemenation MRI 12/21 without acute abnormality Neurology evaluated and delirium thought 2/2 prolonged hospitalization, sleep wake disturbance, nutritional def, critical illness Continued encephalopathy multifactorial, related to infection (above - now on abx), prolonged hospitalization, nutritional def, critical illness --- follow with antibiotics Delirium precautions    Abnormal neurological exam Exam difficult, inconsistent - she's moving all extremities today to painful stimuli, but doesn't consistently follow instructions Seen by neurology 01/01/2021, apparently patient refused exam  MRI c spine recommended 12/21, will obtain this now if possible Pending copper, vitamin E, vitamin b1, and vitamin b6.  Vitamin D wnl. Continue folate and niacin supplementation per neuro EMG/NCS will need to be outpatient   Folate deficiency supplementation  Hypercalcemia Corrects to 12.0, relatively mild Continue IVF and follow PTH 44 -> suggests non parathyroid hypercalcemia ? Related to TNA Consider additional w/u  Protein-calorie malnutrition, severe Continue nutrition as per surgery  Memory loss According to the chart mild prior to admission, patient was independent and still working at  times    DVT prophylaxis: lovenox Code Status: full  Family Communication: none at bedside Disposition: per general surgery  General surgery is primary  Procedures:  Exploratory laparotomy, extended right colectomy, end ileostomy Sherry Hampton), and mucous fistula (distal transverse colon) 11/17/2021  Antimicrobials:  Anti-infectives (From admission, onward)    Start     Dose/Rate Route Frequency Ordered Stop   01/01/22 2100  piperacillin-tazobactam (ZOSYN) IVPB 3.375 g        3.375 g 12.5 mL/hr over 240 Minutes Intravenous Every 8 hours 01/01/22 2042     11/25/21 1000  piperacillin-tazobactam (ZOSYN) IVPB 3.375 g  Status:  Discontinued        3.375 g 12.5 mL/hr over 240 Minutes Intravenous Every 8 hours 11/25/21 0909 12/01/21 0822   11/17/21 1830  piperacillin-tazobactam (ZOSYN) IVPB 3.375 g        3.375 g 12.5 mL/hr over 240 Minutes Intravenous Every 8 hours 11/17/21 1415 11/22/21 2359   11/17/21 1230  piperacillin-tazobactam (ZOSYN) IVPB 3.375 g        3.375 g 100 mL/hr over 30 Minutes Intravenous  Once 11/17/21 1220 11/17/21 1304       Subjective: Answers Sherry Hampton few questions  Objective: Vitals:   01/01/22 0501 01/01/22 1400 01/01/22 2106 01/02/22 0542  BP: 103/69 114/70 110/64 99/67  Pulse: (!) 101 (!) 105 97 88  Resp: '16 18 18 16  ' Temp: 98.9 F (37.2 C) (!) 97.5 F (36.4 C) 98.7 F (37.1 C) (!) 97.5 F (36.4 C)  TempSrc:  Oral Oral Oral  SpO2: 100% 98% 98% 99%  Weight:      Height:        Intake/Output Summary (Last 24 hours) at 01/02/2022 1351 Last data filed at 01/02/2022 1000 Gross per 24 hour  Intake 3075.21 ml  Output 1525 ml  Net 1550.21 ml   Filed Weights   12/17/21 0500 12/24/21 0500 12/30/21 0635  Weight: 76.5 kg 77.9 kg 73.3 kg    Examination:  General: No acute distress. Cardiovascular: RRR Lungs: unlabored Abdomen: surgical wounds with intact dressing, ileostomy and mucous fistula noted Neurological: Alertt, seems more alert today than  yesterday. Still not moving extremities consistently to commands.  Withdrawals from painful stimuli. Skin: Warm and dry. No rashes or lesions. Extremities: No clubbing or cyanosis. No edema.   Data Reviewed: I have personally reviewed following labs and imaging studies  CBC: Recent Labs  Lab 12/30/21 1048 01/01/22 2257 01/02/22 0356  WBC 13.5* 11.2* 12.0*  NEUTROABS  --  8.1* 9.3*  HGB 9.4* 8.9* 8.7*  HCT 30.2* 29.5* 28.6*  MCV 88.8 89.1 89.7  PLT 408* 363 932    Basic Metabolic Panel: Recent Labs  Lab 12/29/21 0630 12/30/21 0550 12/31/21 0303 01/01/22 0449 01/02/22 0356  NA 141 144 145 142 141  K 4.2 3.8 4.0 3.7 3.5  CL 109 111 111 109 110  CO2 '28 28 30 28 26  ' GLUCOSE 141* 155* 152* 142* 150*  BUN 23 22 24* 31* 25*  CREATININE 0.38* 0.43* 0.46 0.33* 0.56  CALCIUM 10.3 10.8* 11.1* 10.6*   10.9* 10.3  MG 2.2 2.2 2.2 2.1 2.1  PHOS 3.0 3.1 3.1 2.6 3.1    GFR: Estimated Creatinine Clearance: 73.8 mL/min (by C-G formula based on SCr of 0.56 mg/dL).  Liver Function Tests: Recent Labs  Lab 12/30/21 0550 01/01/22 0449 01/02/22 0356  AST 16  --  21  ALT 15  --  21  ALKPHOS 88  --  91  BILITOT 0.3  --  0.4  PROT 7.6  --  7.4  ALBUMIN 1.9* 1.8* 1.9*    CBG: Recent Labs  Lab 01/01/22 0724 01/01/22 1134 01/01/22 1707 01/02/22 0737 01/02/22 1136  GLUCAP 126* 121* 100* 125* 106*     Recent Results (from the past 240 hour(s))  Urine Culture     Status: Abnormal   Collection Time: 12/31/21 10:48 AM   Specimen: In/Out Cath Urine  Result Value Ref Range Status   Specimen Description   Final    IN/OUT CATH URINE Performed at [facility], [place] [address], [place], [place] [address]    Special Requests   Final    NONE Performed at [facility], [place] [address], [place], [place] [address]    Culture >=100,000 COLONIES/mL ENTEROCOCCUS FAECALIS (Shirin Echeverry)  Final   Report Status 01/02/2022 FINAL  Final   Organism ID, Bacteria  ENTEROCOCCUS FAECALIS (Alonia Dibuono)  Final      Susceptibility   Enterococcus faecalis - MIC*    AMPICILLIN <=2 SENSITIVE Sensitive     NITROFURANTOIN <=16 SENSITIVE Sensitive     VANCOMYCIN 1 SENSITIVE Sensitive     * >=100,000 COLONIES/mL ENTEROCOCCUS FAECALIS         Radiology Studies: CT ABDOMEN PELVIS W CONTRAST  Result Date: 01/01/2022 CLINICAL DATA:  Postoperative abdominal pain, altered mental status. EXAM: CT ABDOMEN AND PELVIS WITH CONTRAST TECHNIQUE: Multidetector CT imaging of the abdomen and pelvis was performed using the standard protocol following bolus administration of intravenous contrast. CONTRAST:  34m OMNIPAQUE IOHEXOL 350 MG/ML SOLN COMPARISON:  CT abdomen and pelvis 12/22/2021. FINDINGS: Lower chest: There is some atelectasis the lung bases similar to the prior study. Hepatobiliary: Common bile duct is enlarged measuring 1 cm, mild mildly increased from prior. No calcified gallstones are seen. The liver is otherwise within normal limits. Pancreas: Unremarkable. No pancreatic ductal dilatation or surrounding inflammatory changes. Spleen: Normal in size without focal abnormality. Adrenals/Urinary Tract: There are stable nonobstructing left lower pole renal calculi measuring 3 mm. No hydronephrosis or perinephric fluid. Rounded hypodensity in the left kidney is too small to characterize but favored as Sherry Hampton cyst, unchanged. The bladder and adrenal glands are within normal limits. Stomach/Bowel: Left lower quadrant colostomy is again seen which appears within normal limits. There is diffuse colonic diverticulosis without evidence for acute diverticulitis. Right lower quadrant ileostomy is again noted. There is some extravasated oral contrast in the subcutaneous abdominal wall superior and lateral to the ostomy at this level. There is inflammatory tissue at the level of the ostomy extending superiorly abutting the inferior tip of the liver and fundus of the gallbladder similar to the prior  examination. There is likely some early fluid collection development anterior to the right kidney image 7/31 measuring 1.9 by 1.4 by 3.7 cm. Previously described fluid collections near the ostomy are not as well defined on the current study. Remaining small bowel loops and stomach are nondilated. Appendix not seen. Vascular/Lymphatic: There is flattening of the IVC which can be seen with dehydration. Aorta is normal in size. There are no enlarged lymph nodes identified. Reproductive: Calcified uterine fibroids are again noted measuring up to 4.1 cm. No adnexal mass. Other: Ill-defined omental inflammation and fluid in the lower abdomen and left upper quadrant appears unchanged and may be related to omental infarct. There is no free intraperitoneal air or ascites. Small air-fluid collection at the level of the umbilicus is again seen containing air. This collection measures 2.5 x 0.7 by 1.3 cm and has mildly increased in size. Air in  the collection is new from prior. Musculoskeletal: No acute fractures. Degenerative changes affect the spine. IMPRESSION: 1. Right lower quadrant ileostomy present. There is extravasation of oral contrast into subcutaneous abdominal wall at the level of the ostomy worrisome for bowel leak. 2. Inflammatory tissue in the right abdomen at the level of the ostomy extending to the inferior tip the liver has mildly increased. There is new likely early fluid collection development anterior to the right kidney worrisome for phlegmon or abscess. 3. Subcutaneous fluid collection at the level of the umbilicus has increased in size and now contains air worrisome for abscess. 4. Stable nonobstructing left renal calculi. 5. Common bile duct dilatation measuring 1 cm, indeterminate. Recommend correlation with lab values. Consider follow-up ultrasound. Electronically Signed   By: Ronney Asters M.D.   On: 01/01/2022 19:25        Scheduled Meds:  acetaminophen (TYLENOL) oral liquid 160 mg/5 mL   1,000 mg Oral Q6H   alteplase  2 mg Intracatheter Once   chlorhexidine  15 mL Mouth Rinse BID   Chlorhexidine Gluconate Cloth  6 each Topical Daily   enoxaparin (LOVENOX) injection  40 mg Subcutaneous Q24H   feeding supplement  237 mL Oral TID BM   ferrous sulfate  325 mg Oral BID WC   folic acid  1 mg Oral Daily   mouth rinse  15 mL Mouth Rinse q12n4p   mirtazapine  15 mg Oral QHS   multivitamin with minerals  1 tablet Oral Daily   nutrition supplement (JUVEN)  1 packet Oral BID BM   pantoprazole  40 mg Oral QHS   psyllium  1 packet Oral BID   sodium chloride flush  10-40 mL Intracatheter Q12H   sodium hypochlorite   Irrigation TID   thiamine  100 mg Oral BID   Continuous Infusions:  sodium chloride Stopped (12/24/21 1137)   sodium chloride 50 mL/hr at 01/02/22 0411   piperacillin-tazobactam 3.375 g (66/29/47 6546)   TPN CYCLIC-ADULT (ION)       LOS: 46 days    Time spent: over 30 min    Fayrene Helper, MD [facility]   To contact the attending provider between 7A-7P or the covering provider during after hours 7P-7A, please log into the web site [email or web address] and access using universal [place] password for that web site. If you do not have the password, please call the hospital operator.  01/02/2022, 1:51 PM

## 2022-01-02 NOTE — Progress Notes (Signed)
Nutrition Follow-up  DOCUMENTATION CODES:   Severe malnutrition in context of acute illness/injury  INTERVENTION:   -Ensure Enlive po TID, each supplement provides 350 kcal and 20 grams of protein   -Milk served with every meal tray (pt enjoys 2% -provides 120 kcals and 8g protein per carton)   -1 packet Juven BID, each packet provides 95 calories, 2.5 grams of protein (collagen)   -Cyclic TPN management per Pharmacy  NUTRITION DIAGNOSIS:   Severe Malnutrition related to acute illness as evidenced by energy intake < or equal to 50% for > or equal to 5 days, moderate fat depletion, severe muscle depletion.  Ongoing.  GOAL:   Patient will meet greater than or equal to 90% of their needs  TPN meeting ~56% of needs  MONITOR:   PO intake, Supplement acceptance, Labs, Weight trends, I & O's (TPN)  ASSESSMENT:   66 year old female who presents to the emergency department with sudden onset of abdominal pain and distention.CT scan of abdomen and pelvis shows findings consistent with free air with a large pneumoperitoneum.  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  Patient continues cyclic TPN x 14 hrs, providing 1094 kcals and 54g protein. Per chart review, family now not wanting feeding tube placed. Surgery ordered for TPN to increase to meet 100% of needs. RD agrees with this plan now that tube feeding is not being pursued.  Noted that serum copper, Vitamin E, D, B6 and thiamine labs are being checked. So far Vitamin D has come back WNL. Will monitor.  Admission weight: 176 lbs. Last recorded weight 1/9: 161 lbs  Medications: Ferrous sulfate, Folic acid, Remeron, Multivitamin with minerals daily, Metamucil, Thiamine   Labs reviewed:  CBGs: 100-125  Diet Order:   Diet Order             DIET DYS 2 Room service appropriate? No; Fluid consistency: Thin  Diet effective now                   EDUCATION NEEDS:   Education needs have  been addressed  Skin:  Skin Assessment: Skin Integrity Issues: Skin Integrity Issues:: Incisions, Other (Comment) Incisions: 11/27 abdomen Other: left thigh pressure injury  Last BM:  1/12 -ileostomy  Height:   Ht Readings from Last 1 Encounters:  11/17/21 5\' 10"  (1.778 m)    Weight:   Wt Readings from Last 1 Encounters:  12/30/21 73.3 kg    BMI:  Body mass index is 23.19 kg/m.  Estimated Nutritional Needs:   Kcal:  1950-2150  Protein:  95-105g  Fluid:  2L/day  Clayton Bibles, MS, RD, LDN Inpatient Clinical Dietitian Contact information available via Amion

## 2022-01-02 NOTE — Assessment & Plan Note (Addendum)
S/p treatment Repeat urine cx positive - difficult to tell if symptoms with mental status, will treat as above (amoxicillin x 5 days)

## 2022-01-02 NOTE — Progress Notes (Signed)
Assessment & Plan: Change in mental status  Apparently improved but not at baseline  Responds to voice, stimuli; limited verbal response  Meds per Rapid Response  MRI of cervical spine earlier today  [facility] to evaluate  No significant change in overall physiology or surgical status.        [name], MD       [facility]       Office: [phone]   Chief Complaint: Colonic perforation  Subjective: Patient in bed, appears comfortable.  Responds to voice.  Nursing at bedside.  Objective: Vital signs in last 24 hours: Temp:  [97.5 F (36.4 C)-99 F (37.2 C)] 99 F (37.2 C) (01/12 1407) Pulse Rate:  [88-98] 98 (01/12 1407) Resp:  [16-18] 18 (01/12 1407) BP: (99-110)/(64-71) 109/71 (01/12 1407) SpO2:  [98 %-99 %] 99 % (01/12 1407) Last BM Date: 01/02/22  Intake/Output from previous day: 01/11 0701 - 01/12 0700 In: 3450.2 [P.O.:1900; I.V.:1520.5; IV Piggyback:29.7] Out: 1200 [Urine:200; Stool:1000] Intake/Output this shift: No intake/output data recorded.  Physical Exam: HEENT - sclerae clear, mucous membranes moist Neck - soft Chest - clear bilaterally Cor - RRR Abdomen - soft, dressing intact Ext - no edema, patient complains of pain with palpation of legs bilaterally   Lab Results:  Recent Labs    01/01/22 2257 01/02/22 0356  WBC 11.2* 12.0*  HGB 8.9* 8.7*  HCT 29.5* 28.6*  PLT 363 343   BMET Recent Labs    01/01/22 0449 01/02/22 0356  NA 142 141  K 3.7 3.5  CL 109 110  CO2 28 26  GLUCOSE 142* 150*  BUN 31* 25*  CREATININE 0.33* 0.56  CALCIUM 10.6*   10.9* 10.3   PT/INR No results for input(s): LABPROT, INR in the last 72 hours. Comprehensive Metabolic Panel:    Component Value Date/Time   NA 141 01/02/2022 0356   NA 142 01/01/2022 0449   K 3.5 01/02/2022 0356   K 3.7 01/01/2022 0449   CL 110 01/02/2022 0356   CL 109 01/01/2022 0449   CO2 26 01/02/2022 0356   CO2 28 01/01/2022 0449   BUN 25 (H)  01/02/2022 0356   BUN 31 (H) 01/01/2022 0449   CREATININE 0.56 01/02/2022 0356   CREATININE 0.33 (L) 01/01/2022 0449   GLUCOSE 150 (H) 01/02/2022 0356   GLUCOSE 142 (H) 01/01/2022 0449   CALCIUM 10.3 01/02/2022 0356   CALCIUM 10.6 (H) 01/01/2022 0449   CALCIUM 10.9 (H) 01/01/2022 0449   AST 21 01/02/2022 0356   AST 16 12/30/2021 0550   ALT 21 01/02/2022 0356   ALT 15 12/30/2021 0550   ALKPHOS 91 01/02/2022 0356   ALKPHOS 88 12/30/2021 0550   BILITOT 0.4 01/02/2022 0356   BILITOT 0.3 12/30/2021 0550   PROT 7.4 01/02/2022 0356   PROT 7.6 12/30/2021 0550   ALBUMIN 1.9 (L) 01/02/2022 0356   ALBUMIN 1.8 (L) 01/01/2022 0449    Studies/Results: MR CERVICAL SPINE WO CONTRAST  Result Date: 01/02/2022 CLINICAL DATA:  Ataxia, patient favors left side EXAM: MRI CERVICAL SPINE WITHOUT CONTRAST TECHNIQUE: Multiplanar, multisequence MR imaging of the cervical spine was performed. No intravenous contrast was administered. COMPARISON:  No prior MRI available. FINDINGS: Evaluation is somewhat limited by motion artifact. Alignment: Straightening and mild reversal of the normal cervical lordosis. Trace anterolisthesis C4 on C5. Vertebrae: No acute fracture or suspicious osseous lesion. Cord: Normal signal and morphology. Posterior Fossa, vertebral arteries, paraspinal tissues: Negative. Disc levels: C2-C3: No significant  disc bulge. Facet arthropathy. No spinal canal stenosis or neuroforaminal narrowing. C3-C4: Left eccentric disc bulge. Left-greater-than-right uncovertebral hypertrophy. Facet arthropathy. No spinal canal stenosis. Mild-to-moderate left neural foraminal narrowing. C4-C5: Trace anterolisthesis with minimal disc bulge. Uncovertebral and facet arthropathy. No spinal canal stenosis. Mild-to-moderate left neural foraminal narrowing. C5-C6: Left eccentric disc osteophyte complex and uncovertebral hypertrophy. Mild facet arthropathy. No spinal canal stenosis. Severe left and mild right neural  foraminal narrowing. C6-C7: Disc osteophyte complex. Uncovertebral and facet arthropathy. No spinal canal stenosis. Severe bilateral neural foraminal narrowing. C7-T1: No significant disc bulge. No spinal canal stenosis or neuroforaminal narrowing. IMPRESSION: 1. C6-C7 severe bilateral neural foraminal narrowing. 2. C5-C6 severe left and mild right neural foraminal narrowing. 3. C3-C4 and C4-C5 mild-to-moderate left neural foraminal narrowing. 4. No spinal canal stenosis. Electronically Signed   By: Merilyn Baba M.D.   On: 01/02/2022 15:44   CT ABDOMEN PELVIS W CONTRAST  Result Date: 01/01/2022 CLINICAL DATA:  Postoperative abdominal pain, altered mental status. EXAM: CT ABDOMEN AND PELVIS WITH CONTRAST TECHNIQUE: Multidetector CT imaging of the abdomen and pelvis was performed using the standard protocol following bolus administration of intravenous contrast. CONTRAST:  2mL OMNIPAQUE IOHEXOL 350 MG/ML SOLN COMPARISON:  CT abdomen and pelvis 12/22/2021. FINDINGS: Lower chest: There is some atelectasis the lung bases similar to the prior study. Hepatobiliary: Common bile duct is enlarged measuring 1 cm, mild mildly increased from prior. No calcified gallstones are seen. The liver is otherwise within normal limits. Pancreas: Unremarkable. No pancreatic ductal dilatation or surrounding inflammatory changes. Spleen: Normal in size without focal abnormality. Adrenals/Urinary Tract: There are stable nonobstructing left lower pole renal calculi measuring 3 mm. No hydronephrosis or perinephric fluid. Rounded hypodensity in the left kidney is too small to characterize but favored as a cyst, unchanged. The bladder and adrenal glands are within normal limits. Stomach/Bowel: Left lower quadrant colostomy is again seen which appears within normal limits. There is diffuse colonic diverticulosis without evidence for acute diverticulitis. Right lower quadrant ileostomy is again noted. There is some extravasated oral contrast  in the subcutaneous abdominal wall superior and lateral to the ostomy at this level. There is inflammatory tissue at the level of the ostomy extending superiorly abutting the inferior tip of the liver and fundus of the gallbladder similar to the prior examination. There is likely some early fluid collection development anterior to the right kidney image 7/31 measuring 1.9 by 1.4 by 3.7 cm. Previously described fluid collections near the ostomy are not as well defined on the current study. Remaining small bowel loops and stomach are nondilated. Appendix not seen. Vascular/Lymphatic: There is flattening of the IVC which can be seen with dehydration. Aorta is normal in size. There are no enlarged lymph nodes identified. Reproductive: Calcified uterine fibroids are again noted measuring up to 4.1 cm. No adnexal mass. Other: Ill-defined omental inflammation and fluid in the lower abdomen and left upper quadrant appears unchanged and may be related to omental infarct. There is no free intraperitoneal air or ascites. Small air-fluid collection at the level of the umbilicus is again seen containing air. This collection measures 2.5 x 0.7 by 1.3 cm and has mildly increased in size. Air in the collection is new from prior. Musculoskeletal: No acute fractures. Degenerative changes affect the spine. IMPRESSION: 1. Right lower quadrant ileostomy present. There is extravasation of oral contrast into subcutaneous abdominal wall at the level of the ostomy worrisome for bowel leak. 2. Inflammatory tissue in the right abdomen at the level of  the ostomy extending to the inferior tip the liver has mildly increased. There is new likely early fluid collection development anterior to the right kidney worrisome for phlegmon or abscess. 3. Subcutaneous fluid collection at the level of the umbilicus has increased in size and now contains air worrisome for abscess. 4. Stable nonobstructing left renal calculi. 5. Common bile duct dilatation  measuring 1 cm, indeterminate. Recommend correlation with lab values. Consider follow-up ultrasound. Electronically Signed   By: Ronney Asters M.D.   On: 01/01/2022 19:25      [name] 01/02/2022

## 2022-01-02 NOTE — Assessment & Plan Note (Signed)
Noted incidentally on CT scan Bili and alk phos wnl Will defer additional imaging to surgery

## 2022-01-02 NOTE — Progress Notes (Signed)
° ° ° °  Paged by nurse due to change in mental status.  Difficult to answer as phone system doesn't work.  Called floor and spoke with nurse.  Rapid response at bedside.  They have also notified [facility] on call.  I will see patient as well.  [name], MD [facility] Office: [phone]

## 2022-01-02 NOTE — Progress Notes (Signed)
Around 9 primary RN noticed patient had decreased response/change in mental status, not able to follow commands, dilated pupils (6 mm bilaterally) equal and reactive, and a startled reflex to touch and sound. Charge RN notified for consult. Rapid response paged for additional consult. Evening MD paged and made aware (see new orders for medication given), surgery MD also made aware.   Medication given per rapid response, slight improvement in patient noted but she is still not at her baseline (A&O to self, able to follow commands and vocalize).    Night RN will continue to monitor along with rapid response.

## 2022-01-02 NOTE — Progress Notes (Addendum)
PHARMACY - TOTAL PARENTERAL NUTRITION CONSULT NOTE   Indication:  Inability to meet enteral needs  Patient Measurements: Height: 5\' 10"  (177.8 cm) Weight: 73.3 kg (161 lb 9.6 oz) IBW/kg (Calculated) : 68.5 TPN AdjBW (KG): 79.8 Body mass index is 23.19 kg/m. Usual Weight: 80 kg  Assessment: 66 y.o. 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 She is now extended right colectomy and ileostomy. She developed acute metabolic encephalopathy post-op. She was initiated on TF, but tube became clogged on 12/8 and patient refused replacement, and is not eating enough to maintain nutrition.  Calorie count was extended to end 1/5 PM as patient did not consume 3 full meals on 1/4.  CCS discussing possibility of G tube with family. [facility] ordered TPN to start on 12/12, pharmacy to manage.  Glucose / Insulin: No hx DM.   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 No bowel obstruction. 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 Interval decrease in the size of the fluid density inferior to the gallbladder. No drainable fluid collection Identified. Complex collection in the deep subcutaneous soft tissues of theanterior abdominal wall along the midline extending to the skin at the level of the umbilicus - 1/1 abd CT: There is again noted wall thickening  and inflammatory changes involving small bowel loops in the right lower quadrant near the ileostomy suggesting enteritis with surrounding inflammation - 1/11 abd CT: Inflammatory tissue in the right abdomen at the level of the ostomy extending to the inferior tip the liver has mildly increased. There is new likely early fluid collection development anterior to the right kidney worrisome for phlegmon or abscess. Subcutaneous fluid collection at the level of the umbilicus has increased in size and now contains air worrisome for abscess.  GI Surgeries / Procedures:  11/27: Expiratory laparotomy, extended right colectomy, end ileostomy  Central access: PICC placed 12/11 TPN start date: 12/12  Nutritional Goals: RD Assessment: Estimated Needs Total Energy Estimated Needs: 1950-2150 Total Protein Estimated Needs: 95-105g Total Fluid Estimated Needs: 2L/day  Current Nutrition:  Regular diet- Minimal intake, patient noted to be pocketing food and will spit out - Ensure Enlive TID (20 g of protein and 350 kcal) *Intake not consistent - Juven supplement packet (2.5g of protein and 95 kcal)  - Half rate cyclic TPN - 8,127 mL provides 54g protein and 1094 kcal per day. Attempting to stimulate appetite during the day.    Plan:   Now: - potassium chloride 10 mEq IV x2 runs  At 51:70:  Continue cyclic TPN giving ~01% of nutritional needs. Infuse 1,000 mL over 14 hrs: 38 mL/hr x 1 hr, then 77 mL/hr x 12 hrs, then 38 mL/hr x 1 hr. Electrolytes in TPN. Na 79mEq/L K 70 mEq/L Ca 0 mEq/L Mg 7 mEq/L Phos 20 mmol/L Cl:Ac max Ac Continue multivitamin PO Thiamine per neurology Monitor TPN labs  on Mon/Thurs Bmet, phos and Mag on 01/03/22  Dia Sitter, PharmD, BCPS 01/02/2022 7:07 AM  _______________________________________ Per Barkley Boards from [facility], family does not want to proceed with G-tube at this time and patient has minimal oral intake. Claiborne Billings would like [place] to increase TPN to full rate on  01/03/22.  Dia Sitter, PharmD, BCPS 01/02/2022 10:32 AM

## 2022-01-02 NOTE — Progress Notes (Signed)
PT Cancellation Note  Patient Details Name: Sherry Hampton MRN: [id number] DOB: September 21, 1956   Cancelled Treatment:    Reason Eval/Treat Not Completed: Patient's level of consciousness (pt with eyes open but lethargic, no reponse to commands to wiggle toes nor to squeeze my fingers. Pt did not respond to request for her to state her birthdate. She isn't able to participate in PT at present. Will follow.)   Philomena Doheny PT 01/02/2022  Acute Rehabilitation Services Pager [phone] Office [phone]

## 2022-01-03 LAB — CBC WITH DIFFERENTIAL/PLATELET
Abs Immature Granulocytes: 0.05 10*3/uL (ref 0.00–0.07)
Basophils Absolute: 0 10*3/uL (ref 0.0–0.1)
Basophils Relative: 0 %
Eosinophils Absolute: 0.4 10*3/uL (ref 0.0–0.5)
Eosinophils Relative: 4 %
HCT: 26.8 % — ABNORMAL LOW (ref 36.0–46.0)
Hemoglobin: 8.1 g/dL — ABNORMAL LOW (ref 12.0–15.0)
Immature Granulocytes: 1 %
Lymphocytes Relative: 7 %
Lymphs Abs: 0.7 10*3/uL (ref 0.7–4.0)
MCH: 26.8 pg (ref 26.0–34.0)
MCHC: 30.2 g/dL (ref 30.0–36.0)
MCV: 88.7 fL (ref 80.0–100.0)
Monocytes Absolute: 0.7 10*3/uL (ref 0.1–1.0)
Monocytes Relative: 7 %
Neutro Abs: 7.7 10*3/uL (ref 1.7–7.7)
Neutrophils Relative %: 81 %
Platelets: 300 10*3/uL (ref 150–400)
RBC: 3.02 MIL/uL — ABNORMAL LOW (ref 3.87–5.11)
RDW: 16.7 % — ABNORMAL HIGH (ref 11.5–15.5)
WBC: 9.5 10*3/uL (ref 4.0–10.5)
nRBC: 0 % (ref 0.0–0.2)

## 2022-01-03 LAB — MAGNESIUM: Magnesium: 2 mg/dL (ref 1.7–2.4)

## 2022-01-03 LAB — GLUCOSE, CAPILLARY: Glucose-Capillary: 162 mg/dL — ABNORMAL HIGH (ref 70–99)

## 2022-01-03 LAB — COMPREHENSIVE METABOLIC PANEL
ALT: 17 U/L (ref 0–44)
AST: 17 U/L (ref 15–41)
Albumin: 1.7 g/dL — ABNORMAL LOW (ref 3.5–5.0)
Alkaline Phosphatase: 83 U/L (ref 38–126)
Anion gap: 4 — ABNORMAL LOW (ref 5–15)
BUN: 25 mg/dL — ABNORMAL HIGH (ref 8–23)
CO2: 25 mmol/L (ref 22–32)
Calcium: 10.3 mg/dL (ref 8.9–10.3)
Chloride: 113 mmol/L — ABNORMAL HIGH (ref 98–111)
Creatinine, Ser: 0.5 mg/dL (ref 0.44–1.00)
GFR, Estimated: >60 mL/min (ref >60)
Glucose, Bld: 126 mg/dL — ABNORMAL HIGH (ref 70–99)
Potassium: 3.5 mmol/L (ref 3.5–5.1)
Sodium: 142 mmol/L (ref 135–145)
Total Bilirubin: 0.3 mg/dL (ref 0.3–1.2)
Total Protein: 6.9 g/dL (ref 6.5–8.1)

## 2022-01-03 LAB — BASIC METABOLIC PANEL
Anion gap: 8 (ref 5–15)
BUN: 22 mg/dL (ref 8–23)
CO2: 26 mmol/L (ref 22–32)
Calcium: 10.3 mg/dL (ref 8.9–10.3)
Chloride: 108 mmol/L (ref 98–111)
Creatinine, Ser: 0.48 mg/dL (ref 0.44–1.00)
GFR, Estimated: >60 mL/min (ref >60)
Glucose, Bld: 142 mg/dL — ABNORMAL HIGH (ref 70–99)
Potassium: 3.7 mmol/L (ref 3.5–5.1)
Sodium: 142 mmol/L (ref 135–145)

## 2022-01-03 LAB — PHOSPHORUS: Phosphorus: 3 mg/dL (ref 2.5–4.6)

## 2022-01-03 LAB — VITAMIN B6

## 2022-01-03 LAB — ALBUMIN: Albumin: 1.7 g/dL — ABNORMAL LOW (ref 3.5–5.0)

## 2022-01-03 MED ORDER — TRAVASOL 10 % IV SOLN
INTRAVENOUS | Status: AC
  Filled 2022-01-03: qty 750

## 2022-01-03 MED ORDER — INSULIN ASPART 100 UNIT/ML IJ SOLN
0.0000 [IU] | INTRAMUSCULAR | Status: DC
  Administered 2022-01-03 – 2022-01-04 (×3): 2 [IU] via SUBCUTANEOUS
  Administered 2022-01-04: 1 [IU] via SUBCUTANEOUS
  Administered 2022-01-05: 2 [IU] via SUBCUTANEOUS
  Administered 2022-01-05 (×2): 1 [IU] via SUBCUTANEOUS
  Administered 2022-01-06: 2 [IU] via SUBCUTANEOUS
  Administered 2022-01-06: 1 [IU] via SUBCUTANEOUS
  Administered 2022-01-07: 2 [IU] via SUBCUTANEOUS

## 2022-01-03 NOTE — Progress Notes (Signed)
Rapid Response Event Note   Reason for Call : less responsive - different from baseline   Initial Focused Assessment:  Pt awake with eye open. Pupils VERY dilated but reactive. Pt was tracking but remained nonverbal and lethargic. Pt received Oxy PO @ 3005R then 0.5mg  Ativan @ 1353p to tolerate MRI scan. Labs showed elevated BUN 25 w/ a WNL creatine and GFR.   During 1st visit: 0.3 IV flumazenil given @ 1922p with improvement in pt alertness  During 2nd visit: 0.3 IV flumazenil given @ 2157p with improvement in pt alertness  During 3rd visit @ 0020a: Pt remained easily arousable to voice and movement towards her. Pt remained a bit lethargic but it was unclear if pt was sleepy vs still suffering from depression from ativan. Consulted w/ X. Blount, NP and we decided to let her sleep it off w/ RR RN rounding on pt a 4th time around 0600a   Interventions:  Ativan reversal agent - Flumazenil IV x2  Plan of Care:  Continue to monitor alertness  MD Notified: X. Blount, NP Call Time: 1900p Arrival Time: 1910p End Time: [place], RN

## 2022-01-03 NOTE — Progress Notes (Signed)
PHARMACY - TOTAL PARENTERAL NUTRITION CONSULT NOTE   Indication:  Inability to meet enteral needs  Patient Measurements: Height: 5\' 10"  (177.8 cm) Weight: 73.3 kg (161 lb 9.6 oz) IBW/kg (Calculated) : 68.5 TPN AdjBW (KG): 79.8 Body mass index is 23.19 kg/m. Usual Weight: 80 kg  Assessment: 66 y.o. 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 She is now extended right colectomy and ileostomy. She developed acute metabolic encephalopathy post-op. She was initiated on TF, but tube became clogged on 12/8 and patient refused replacement, and is not eating enough to maintain nutrition.  Calorie count was extended to end 1/5 PM as patient did not consume 3 full meals on 1/4.  CCS ordered TPN to start on 12/12, pharmacy to manage.  Family does not wish to proceed with G-tube placement.  CCS made request on 01/02/22 to titrate TPN from half rate to full rate since pt has poor oral intake and not doing tube feed.  Glucose / Insulin: No hx DM.   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 No bowel obstruction. 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 Interval decrease in the size of the fluid density inferior to the gallbladder. No drainable fluid collection Identified. Complex collection in the deep subcutaneous soft tissues of theanterior abdominal wall along the midline extending to the skin at the level of the  umbilicus - 1/1 abd CT: There is again noted wall thickening and inflammatory changes involving small bowel loops in the right lower quadrant near the ileostomy suggesting enteritis with surrounding inflammation - 1/11 abd CT: Inflammatory tissue in the right abdomen at the level of the ostomy extending to the inferior tip the liver has mildly increased. There is new likely early fluid collection development anterior to the right kidney worrisome for phlegmon or abscess. Subcutaneous fluid collection at the level of the umbilicus has increased in size and now contains air worrisome for abscess.  GI Surgeries / Procedures:  11/27: Expiratory laparotomy, extended right colectomy, end ileostomy  Central access: PICC placed 12/11 TPN start date: 12/12  Nutritional Goals: RD Assessment: Estimated Needs Total Energy Estimated Needs: 1950-2150 Total Protein Estimated Needs: 95-105g Total Fluid Estimated Needs: 2L/day  Current Nutrition:  Regular diet- Minimal intake, patient noted to be pocketing food and will spit out - Ensure Enlive TID (20 g of protein and 350 kcal)  - Juven supplement packet (2.5g of protein and 95 kcal)  - cyclic TPN   Plan:   At 24:58:  Increase cyclic TPN from 0998 mL to 1,500 mL over 14 hrs starting at 6p today.  If patient tolerates this, then will increase to full volume of 2040 mL on 01/04/22. Cyclic TPN at full rate with volume of 2040 mL will provide 102 gm protein and 1998 Kcal Electrolytes in TPN. Na 36mEq/L K 70 mEq/L Ca 0 mEq/L Mg 7 mEq/L Phos 20 mmol/L Cl:Ac max Ac Continue multivitamin PO Thiamine per neurology Sensitive SSI 4x a  day based cbgs drawn: 2 hrs past cyclic TPN start + 1 hr past cycle TPN d/ced + middle of TPN infusion + while off TPN (8p, 2a, 9a, noon) Monitor TPN labs on Mon/Thurs Bmet, phos and Mag on 01/04/22  Dia Sitter, PharmD, BCPS 01/03/2022 7:06 AM

## 2022-01-03 NOTE — Progress Notes (Signed)
Consult NOTE    Sherry Hampton  TLX:[id number] DOB: 16-Jul-1956 DOA: 11/17/2021 PCP: Pcp, No  No chief complaint on file.   Brief Narrative:  66 year old woman according to chart was independent, living alone and driving and continue to work as needed.  PMH including hyperlipidemia, possibly some memory loss, presented 11/27 with sudden abdominal pain.  Underwent emergent surgery for perforated viscus of the ascending colon with fecal peritonitis same day.  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  Hospitalization has been complicated by fluid collections in the abdomen, malnutrition, currently on TNA, intermittent encephalopathy and failure to thrive and progressive decline in strength and ability to participate with physical therapy.    See previous notes    Assessment & Plan:   Principal Problem:   Perforated abdominal viscus Active Problems:   Acute metabolic encephalopathy   Enterococcus UTI   Abnormal neurological exam   Hypercalcemia   Folate deficiency   Protein-calorie malnutrition, severe   Common bile duct dilatation   Memory loss   Delirium   GERD (gastroesophageal reflux disease)   Hyperlipidemia   Hyponatremia   Acute blood loss anemia   Necrosis of colon with perforation    Ileostomy in place [facility])   * Perforated abdominal viscus- (present on admission) --per surgery S/p ex lap, extended right colectomy, end ileostomy, mucous fistula 11/17/2021 CT abd pelvis 1/12 with extravasation of oral contrast into subcutaneous abdominal wall at level of ostomy concerning for bowel leak, inflammatory tissue in R abdomen at level of ostomy extending to the inferior tip of the liver, early fluid collection concerning for phlegmon or abscess, subcutaneous fluid colelction at level of umbilicus increased in size and with air concerning for abscess abx started (1/11 - present) with zosyn Abnormal  imaging findings per surgery -> no collection around ileostomy that is drainable, recommending abx and improving nutrition  Enterococcus UTI On zosyn cx from 7/41/63  Acute metabolic encephalopathy- (present on admission) alert, but obviously confused - better attention than 1/11 Follows commands inconsistently, difficult exam  Exam was very difficult.  Mentation appears well.  See 12/28/2021 initial consult note [name]) for review of the chart and extensive work-up. Previously with normal ammonia, TSH, HIV S/p high dose thiamine, continue supplemenation MRI brain 12/21 without acute abnormality MRI C spine with C6-7 severe bilateral neural foraminal narrowing, C5-6 severe L and mild R enural foraminal narrowing, C3-4 and C4-5 mild to moderate L neural foraminal narrowing Neurology evaluated and delirium thought 2/2 prolonged hospitalization, sleep wake disturbance, nutritional def, critical illness Continued encephalopathy multifactorial, related to infection (above - now on abx), prolonged hospitalization, nutritional def, critical illness --- follow with antibiotics Delirium precautions    Abnormal neurological exam Exam difficult, inconsistent - she's moving all extremities today to painful stimuli, but doesn't consistently follow instructions Seen by neurology 01/01/2021, apparently patient refused exam  MRI c spine recommended 12/21, MRI c spine with C6-7 severe bilateral neural foraminal narrowing, C5-6 severe L and mild R enural foraminal narrowing, C3-4 and C4-5 mild to moderate L neural foraminal narrowing Pending copper, vitamin E, vitamin b1, and vitamin b6.  Vitamin D wnl. Continue folate and niacin supplementation per neuro Will get EEG Will consider additional MRI imaging EMG/NCS will need to be outpatient   Folate deficiency supplementation  Hypercalcemia Corrects to 12.0, relatively mild - stable from yesterday Continue IVF and follow PTH 44 -> suggests non  parathyroid hypercalcemia ? Related to TNA  Consider additional w/u  Common bile duct dilatation Noted incidentally on CT scan Bili and alk phos wnl Will defer additional imaging to surgery  Protein-calorie malnutrition, severe Continue nutrition as per surgery  Memory loss According to the chart mild prior to admission, patient was independent and still working at times    DVT prophylaxis: lovenox Code Status: full Family Communication: none at bedside Disposition: per general surgery  General surgery is primary  Procedures:  Exploratory laparotomy, extended right colectomy, end ileostomy [name]), and mucous fistula (distal transverse colon) 11/17/2021  Antimicrobials:  Anti-infectives (From admission, onward)    Start     Dose/Rate Route Frequency Ordered Stop   01/01/22 2100  piperacillin-tazobactam (ZOSYN) IVPB 3.375 g        3.375 g 12.5 mL/hr over 240 Minutes Intravenous Every 8 hours 01/01/22 2042     11/25/21 1000  piperacillin-tazobactam (ZOSYN) IVPB 3.375 g  Status:  Discontinued        3.375 g 12.5 mL/hr over 240 Minutes Intravenous Every 8 hours 11/25/21 0909 12/01/21 0822   11/17/21 1830  piperacillin-tazobactam (ZOSYN) IVPB 3.375 g        3.375 g 12.5 mL/hr over 240 Minutes Intravenous Every 8 hours 11/17/21 1415 11/22/21 2359   11/17/21 1230  piperacillin-tazobactam (ZOSYN) IVPB 3.375 g        3.375 g 100 mL/hr over 30 Minutes Intravenous  Once 11/17/21 1220 11/17/21 1304       Subjective: Answers some questions, inconsistent  Objective: Vitals:   01/03/22 1311 01/03/22 1429 01/03/22 1437 01/03/22 1525  BP: 123/68 114/69  106/65  Pulse: (!) 119 (!) 112 (!) 108 (!) 102  Resp: 16 15    Temp: 97.6 F (36.4 C) 98.3 F (36.8 C)    TempSrc: Oral Oral    SpO2: 99% 97% 97% 96%  Weight:      Height:        Intake/Output Summary (Last 24 hours) at 01/03/2022 1532 Last data filed at 01/03/2022 1310 Gross per 24 hour  Intake 2059.05 ml  Output  1480 ml  Net 579.05 ml   Filed Weights   12/17/21 0500 12/24/21 0500 12/30/21 0635  Weight: 76.5 kg 77.9 kg 73.3 kg    Examination: General: No acute distress. Cardiovascular: RRR Lungs: unlabored Abdomen: ostomy with green/brown stool, midline wound dressing intact, mucous fistula Neurological: alert, doesn't consistently follow commands. Withdraws from painful stimuli Skin: Warm and dry. No rashes or lesions. Extremities: No clubbing or cyanosis. No edema.  Data Reviewed: I have personally reviewed following labs and imaging studies  CBC: Recent Labs  Lab 12/30/21 1048 01/01/22 2257 01/02/22 0356 01/02/22 2144 01/03/22 0445  WBC 13.5* 11.2* 12.0* 9.9 9.5  NEUTROABS  --  8.1* 9.3* 7.9* 7.7  HGB 9.4* 8.9* 8.7* 8.1* 8.1*  HCT 30.2* 29.5* 28.6* 27.2* 26.8*  MCV 88.8 89.1 89.7 89.8 88.7  PLT 408* 363 343 306 462    Basic Metabolic Panel: Recent Labs  Lab 12/30/21 0550 12/31/21 0303 01/01/22 0449 01/02/22 0356 01/02/22 2144 01/03/22 0445  NA 144 145 142 141 142 142  K 3.8 4.0 3.7 3.5 3.5 3.7  CL 111 111 109 110 113* 108  CO2 '28 30 28 26 25 26  ' GLUCOSE 155* 152* 142* 150* 126* 142*  BUN 22 24* 31* 25* 25* 22  CREATININE 0.43* 0.46 0.33* 0.56 0.50 0.48  CALCIUM 10.8* 11.1* 10.6*   10.9* 10.3 10.3 10.3  MG 2.2 2.2 2.1 2.1  --  2.0  PHOS 3.1 3.1 2.6 3.1  --  3.0    GFR: Estimated Creatinine Clearance: 73.8 mL/min (by C-G formula based on SCr of 0.48 mg/dL).  Liver Function Tests: Recent Labs  Lab 12/30/21 0550 01/01/22 0449 01/02/22 0356 01/02/22 2144 01/03/22 0445  AST 16  --  21 17  --   ALT 15  --  21 17  --   ALKPHOS 88  --  91 83  --   BILITOT 0.3  --  0.4 0.3  --   PROT 7.6  --  7.4 6.9  --   ALBUMIN 1.9* 1.8* 1.9* 1.7* 1.7*    CBG: Recent Labs  Lab 01/01/22 1134 01/01/22 1707 01/02/22 0737 01/02/22 1136 01/02/22 1601  GLUCAP 121* 100* 125* 106* 104*     Recent Results (from the past 240 hour(s))  Urine Culture     Status: Abnormal    Collection Time: 12/31/21 10:48 AM   Specimen: In/Out Cath Urine  Result Value Ref Range Status   Specimen Description   Final    IN/OUT CATH URINE Performed at [facility], [place] [address] [place], [place] [address]    Special Requests   Final    NONE Performed at [facility], [place] [address] [place], [place] [address]    Culture >=100,000 COLONIES/mL ENTEROCOCCUS FAECALIS (Keonia Pasko)  Final   Report Status 01/02/2022 FINAL  Final   Organism ID, Bacteria ENTEROCOCCUS FAECALIS (Kylani Wires)  Final      Susceptibility   Enterococcus faecalis - MIC*    AMPICILLIN <=2 SENSITIVE Sensitive     NITROFURANTOIN <=16 SENSITIVE Sensitive     VANCOMYCIN 1 SENSITIVE Sensitive     * >=100,000 COLONIES/mL ENTEROCOCCUS FAECALIS         Radiology Studies: MR CERVICAL SPINE WO CONTRAST  Result Date: 01/02/2022 CLINICAL DATA:  Ataxia, patient favors left side EXAM: MRI CERVICAL SPINE WITHOUT CONTRAST TECHNIQUE: Multiplanar, multisequence MR imaging of the cervical spine was performed. No intravenous contrast was administered. COMPARISON:  No prior MRI available. FINDINGS: Evaluation is somewhat limited by motion artifact. Alignment: Straightening and mild reversal of the normal cervical lordosis. Trace anterolisthesis C4 on C5. Vertebrae: No acute fracture or suspicious osseous lesion. Cord: Normal signal and morphology. Posterior Fossa, vertebral arteries, paraspinal tissues: Negative. Disc levels: C2-C3: No significant disc bulge. Facet arthropathy. No spinal canal stenosis or neuroforaminal narrowing. C3-C4: Left eccentric disc bulge. Left-greater-than-right uncovertebral hypertrophy. Facet arthropathy. No spinal canal stenosis. Mild-to-moderate left neural foraminal narrowing. C4-C5: Trace anterolisthesis with minimal disc bulge. Uncovertebral and facet arthropathy. No spinal canal stenosis. Mild-to-moderate left neural foraminal narrowing. C5-C6: Left eccentric disc  osteophyte complex and uncovertebral hypertrophy. Mild facet arthropathy. No spinal canal stenosis. Severe left and mild right neural foraminal narrowing. C6-C7: Disc osteophyte complex. Uncovertebral and facet arthropathy. No spinal canal stenosis. Severe bilateral neural foraminal narrowing. C7-T1: No significant disc bulge. No spinal canal stenosis or neuroforaminal narrowing. IMPRESSION: 1. C6-C7 severe bilateral neural foraminal narrowing. 2. C5-C6 severe left and mild right neural foraminal narrowing. 3. C3-C4 and C4-C5 mild-to-moderate left neural foraminal narrowing. 4. No spinal canal stenosis. Electronically Signed   By: Merilyn Baba M.D.   On: 01/02/2022 15:44   CT ABDOMEN PELVIS W CONTRAST  Result Date: 01/01/2022 CLINICAL DATA:  Postoperative abdominal pain, altered mental status. EXAM: CT ABDOMEN AND PELVIS WITH CONTRAST TECHNIQUE: Multidetector CT imaging of the abdomen and pelvis was performed using the standard protocol following bolus administration of intravenous contrast. CONTRAST:  52m  OMNIPAQUE IOHEXOL 350 MG/ML SOLN COMPARISON:  CT abdomen and pelvis 12/22/2021. FINDINGS: Lower chest: There is some atelectasis the lung bases similar to the prior study. Hepatobiliary: Common bile duct is enlarged measuring 1 cm, mild mildly increased from prior. No calcified gallstones are seen. The liver is otherwise within normal limits. Pancreas: Unremarkable. No pancreatic ductal dilatation or surrounding inflammatory changes. Spleen: Normal in size without focal abnormality. Adrenals/Urinary Tract: There are stable nonobstructing left lower pole renal calculi measuring 3 mm. No hydronephrosis or perinephric fluid. Rounded hypodensity in the left kidney is too small to characterize but favored as Korion Cuevas cyst, unchanged. The bladder and adrenal glands are within normal limits. Stomach/Bowel: Left lower quadrant colostomy is again seen which appears within normal limits. There is diffuse colonic  diverticulosis without evidence for acute diverticulitis. Right lower quadrant ileostomy is again noted. There is some extravasated oral contrast in the subcutaneous abdominal wall superior and lateral to the ostomy at this level. There is inflammatory tissue at the level of the ostomy extending superiorly abutting the inferior tip of the liver and fundus of the gallbladder similar to the prior examination. There is likely some early fluid collection development anterior to the right kidney image 7/31 measuring 1.9 by 1.4 by 3.7 cm. Previously described fluid collections near the ostomy are not as well defined on the current study. Remaining small bowel loops and stomach are nondilated. Appendix not seen. Vascular/Lymphatic: There is flattening of the IVC which can be seen with dehydration. Aorta is normal in size. There are no enlarged lymph nodes identified. Reproductive: Calcified uterine fibroids are again noted measuring up to 4.1 cm. No adnexal mass. Other: Ill-defined omental inflammation and fluid in the lower abdomen and left upper quadrant appears unchanged and may be related to omental infarct. There is no free intraperitoneal air or ascites. Small air-fluid collection at the level of the umbilicus is again seen containing air. This collection measures 2.5 x 0.7 by 1.3 cm and has mildly increased in size. Air in the collection is new from prior. Musculoskeletal: No acute fractures. Degenerative changes affect the spine. IMPRESSION: 1. Right lower quadrant ileostomy present. There is extravasation of oral contrast into subcutaneous abdominal wall at the level of the ostomy worrisome for bowel leak. 2. Inflammatory tissue in the right abdomen at the level of the ostomy extending to the inferior tip the liver has mildly increased. There is new likely early fluid collection development anterior to the right kidney worrisome for phlegmon or abscess. 3. Subcutaneous fluid collection at the level of the  umbilicus has increased in size and now contains air worrisome for abscess. 4. Stable nonobstructing left renal calculi. 5. Common bile duct dilatation measuring 1 cm, indeterminate. Recommend correlation with lab values. Consider follow-up ultrasound. Electronically Signed   By: Ronney Asters M.D.   On: 01/01/2022 19:25        Scheduled Meds:  acetaminophen (TYLENOL) oral liquid 160 mg/5 mL  1,000 mg Oral Q6H   alteplase  2 mg Intracatheter Once   chlorhexidine  15 mL Mouth Rinse BID   Chlorhexidine Gluconate Cloth  6 each Topical Daily   enoxaparin (LOVENOX) injection  40 mg Subcutaneous Q24H   feeding supplement  237 mL Oral TID BM   ferrous sulfate  325 mg Oral BID WC   folic acid  1 mg Oral Daily   insulin aspart  0-9 Units Subcutaneous 4 times per day   mouth rinse  15 mL Mouth Rinse q12n4p  mirtazapine  15 mg Oral QHS   multivitamin with minerals  1 tablet Oral Daily   niacin  100 mg Oral QHS   nutrition supplement (JUVEN)  1 packet Oral BID BM   pantoprazole  40 mg Oral QHS   psyllium  1 packet Oral BID   sodium chloride flush  10-40 mL Intracatheter Q12H   sodium hypochlorite   Irrigation TID   thiamine  100 mg Oral BID   Continuous Infusions:  sodium chloride Stopped (12/24/21 1137)   sodium chloride 75 mL/hr at 01/02/22 1352   piperacillin-tazobactam 3.375 g (29/92/42 6834)   TPN CYCLIC-ADULT (ION)       LOS: 47 days    Time spent: over 30 min    Fayrene Helper, MD [facility]   To contact the attending provider between 7A-7P or the covering provider during after hours 7P-7A, please log into the web site [email or web address] and access using universal [place] password for that web site. If you do not have the password, please call the hospital operator.  01/03/2022, 3:32 PM

## 2022-01-03 NOTE — Progress Notes (Signed)
Progress Note  47 Days Post-Op  Subjective: Events of overnight reviewed. More alert this AM and conversant. Reports she would like an ensure this AM. She was doing some ROM exercises with PT.    Objective: Vital signs in last 24 hours: Temp:  [97.4 F (36.3 C)-100 F (37.8 C)] 98.8 F (37.1 C) (01/13 0554) Pulse Rate:  [87-98] 88 (01/13 0554) Resp:  [14-24] 14 (01/13 0554) BP: (104-127)/(59-72) 113/70 (01/13 0554) SpO2:  [97 %-100 %] 97 % (01/13 0554) Last BM Date: 01/02/22  Intake/Output from previous day: 01/12 0701 - 01/13 0700 In: 1361.2 [P.O.:90; I.V.:1200.5; IV Piggyback:70.7] Out: 2055 [Urine:1500; Stool:555] Intake/Output this shift: No intake/output data recorded.  PE: General: resting, NAD Resp: normal work of breathing Abdomen: soft, nondistended, nontender to palpation. Midline incision with purulent drainage noted, wound probed and 7.5 cm tract in inferior wound that extends inferiolaterally toward ileostomy, 1.5 cm tract in superior wound that extends superiorly. Ileostomy productive of oatmeal consistency stool. 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 Extremities: warm and well-perfused Neuro: FC, no focal deficits this AM but generalized weakness   Lab Results:  Recent Labs    01/02/22 2144 01/03/22 0445  WBC 9.9 9.5  HGB 8.1* 8.1*  HCT 27.2* 26.8*  PLT 306 300   BMET Recent Labs    01/02/22 2144 01/03/22 0445  NA 142 142  K 3.5 3.7  CL 113* 108  CO2 25 26  GLUCOSE 126* 142*  BUN 25* 22  CREATININE 0.50 0.48  CALCIUM 10.3 10.3   PT/INR No results for input(s): LABPROT, INR in the last 72 hours. CMP     Component Value Date/Time   NA 142 01/03/2022 0445   K 3.7 01/03/2022 0445   CL 108 01/03/2022 0445   CO2 26 01/03/2022 0445   GLUCOSE 142 (H) 01/03/2022 0445   BUN 22 01/03/2022 0445   CREATININE 0.48 01/03/2022 0445   CALCIUM 10.3 01/03/2022 0445   CALCIUM 10.9 (H) 01/01/2022 0449   PROT 6.9 01/02/2022 2144   ALBUMIN  1.7 (L) 01/03/2022 0445   AST 17 01/02/2022 2144   ALT 17 01/02/2022 2144   ALKPHOS 83 01/02/2022 2144   BILITOT 0.3 01/02/2022 2144   GFRNONAA >60 01/03/2022 0445   Lipase     Component Value Date/Time   LIPASE 22 11/17/2021 0955       Studies/Results: MR CERVICAL SPINE WO CONTRAST  Result Date: 01/02/2022 CLINICAL DATA:  Ataxia, patient favors left side EXAM: MRI CERVICAL SPINE WITHOUT CONTRAST TECHNIQUE: Multiplanar, multisequence MR imaging of the cervical spine was performed. No intravenous contrast was administered. COMPARISON:  No prior MRI available. FINDINGS: Evaluation is somewhat limited by motion artifact. Alignment: Straightening and mild reversal of the normal cervical lordosis. Trace anterolisthesis C4 on C5. Vertebrae: No acute fracture or suspicious osseous lesion. Cord: Normal signal and morphology. Posterior Fossa, vertebral arteries, paraspinal tissues: Negative. Disc levels: C2-C3: No significant disc bulge. Facet arthropathy. No spinal canal stenosis or neuroforaminal narrowing. C3-C4: Left eccentric disc bulge. Left-greater-than-right uncovertebral hypertrophy. Facet arthropathy. No spinal canal stenosis. Mild-to-moderate left neural foraminal narrowing. C4-C5: Trace anterolisthesis with minimal disc bulge. Uncovertebral and facet arthropathy. No spinal canal stenosis. Mild-to-moderate left neural foraminal narrowing. C5-C6: Left eccentric disc osteophyte complex and uncovertebral hypertrophy. Mild facet arthropathy. No spinal canal stenosis. Severe left and mild right neural foraminal narrowing. C6-C7: Disc osteophyte complex. Uncovertebral and facet arthropathy. No spinal canal stenosis. Severe bilateral neural foraminal narrowing. C7-T1: No significant disc bulge. No  spinal canal stenosis or neuroforaminal narrowing. IMPRESSION: 1. C6-C7 severe bilateral neural foraminal narrowing. 2. C5-C6 severe left and mild right neural foraminal narrowing. 3. C3-C4 and C4-C5  mild-to-moderate left neural foraminal narrowing. 4. No spinal canal stenosis. Electronically Signed   By: Merilyn Baba M.D.   On: 01/02/2022 15:44   CT ABDOMEN PELVIS W CONTRAST  Result Date: 01/01/2022 CLINICAL DATA:  Postoperative abdominal pain, altered mental status. EXAM: CT ABDOMEN AND PELVIS WITH CONTRAST TECHNIQUE: Multidetector CT imaging of the abdomen and pelvis was performed using the standard protocol following bolus administration of intravenous contrast. CONTRAST:  59mL OMNIPAQUE IOHEXOL 350 MG/ML SOLN COMPARISON:  CT abdomen and pelvis 12/22/2021. FINDINGS: Lower chest: There is some atelectasis the lung bases similar to the prior study. Hepatobiliary: Common bile duct is enlarged measuring 1 cm, mild mildly increased from prior. No calcified gallstones are seen. The liver is otherwise within normal limits. Pancreas: Unremarkable. No pancreatic ductal dilatation or surrounding inflammatory changes. Spleen: Normal in size without focal abnormality. Adrenals/Urinary Tract: There are stable nonobstructing left lower pole renal calculi measuring 3 mm. No hydronephrosis or perinephric fluid. Rounded hypodensity in the left kidney is too small to characterize but favored as a cyst, unchanged. The bladder and adrenal glands are within normal limits. Stomach/Bowel: Left lower quadrant colostomy is again seen which appears within normal limits. There is diffuse colonic diverticulosis without evidence for acute diverticulitis. Right lower quadrant ileostomy is again noted. There is some extravasated oral contrast in the subcutaneous abdominal wall superior and lateral to the ostomy at this level. There is inflammatory tissue at the level of the ostomy extending superiorly abutting the inferior tip of the liver and fundus of the gallbladder similar to the prior examination. There is likely some early fluid collection development anterior to the right kidney image 7/31 measuring 1.9 by 1.4 by 3.7 cm.  Previously described fluid collections near the ostomy are not as well defined on the current study. Remaining small bowel loops and stomach are nondilated. Appendix not seen. Vascular/Lymphatic: There is flattening of the IVC which can be seen with dehydration. Aorta is normal in size. There are no enlarged lymph nodes identified. Reproductive: Calcified uterine fibroids are again noted measuring up to 4.1 cm. No adnexal mass. Other: Ill-defined omental inflammation and fluid in the lower abdomen and left upper quadrant appears unchanged and may be related to omental infarct. There is no free intraperitoneal air or ascites. Small air-fluid collection at the level of the umbilicus is again seen containing air. This collection measures 2.5 x 0.7 by 1.3 cm and has mildly increased in size. Air in the collection is new from prior. Musculoskeletal: No acute fractures. Degenerative changes affect the spine. IMPRESSION: 1. Right lower quadrant ileostomy present. There is extravasation of oral contrast into subcutaneous abdominal wall at the level of the ostomy worrisome for bowel leak. 2. Inflammatory tissue in the right abdomen at the level of the ostomy extending to the inferior tip the liver has mildly increased. There is new likely early fluid collection development anterior to the right kidney worrisome for phlegmon or abscess. 3. Subcutaneous fluid collection at the level of the umbilicus has increased in size and now contains air worrisome for abscess. 4. Stable nonobstructing left renal calculi. 5. Common bile duct dilatation measuring 1 cm, indeterminate. Recommend correlation with lab values. Consider follow-up ultrasound. Electronically Signed   By: Ronney Asters M.D.   On: 01/01/2022 19:25    Anti-infectives: Anti-infectives (From admission, onward)  Start     Dose/Rate Route Frequency Ordered Stop   01/01/22 2100  piperacillin-tazobactam (ZOSYN) IVPB 3.375 g        3.375 g 12.5 mL/hr over 240  Minutes Intravenous Every 8 hours 01/01/22 2042     11/25/21 1000  piperacillin-tazobactam (ZOSYN) IVPB 3.375 g  Status:  Discontinued        3.375 g 12.5 mL/hr over 240 Minutes Intravenous Every 8 hours 11/25/21 0909 12/01/21 0822   11/17/21 1830  piperacillin-tazobactam (ZOSYN) IVPB 3.375 g        3.375 g 12.5 mL/hr over 240 Minutes Intravenous Every 8 hours 11/17/21 1415 11/22/21 2359   11/17/21 1230  piperacillin-tazobactam (ZOSYN) IVPB 3.375 g        3.375 g 100 mL/hr over 30 Minutes Intravenous  Once 11/17/21 1220 11/17/21 1304        Assessment/Plan 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 Harlow Asa 11/27 - path: necrotic colon at site of perforation, no malignancy or dysplasia noted  - Ileostomy output improved. Continue BID metamucil.  - WOC following for ileostomy, mucous fistula - Wound with blue-green, musty smelling drainage 1/11 - Dakin's TID x72 hrs. Wound probed and deeper tract opened after CT 1/11 with return of purulent drainage.   - upon DC of Dakin's would like TID dry dressing to help absorb drainage  - Pathology benign. CT distal descending/proximal sigmoid bowel luminal collapse. - CT 1/11 - some extravasated oral contrast in the subcutaneous abdominal wall superior and lateral to the ostomy at this level. There is inflammatory tissue at the level of the ostomy extending superiorly abutting the inferior tip of the liver and fundus of the gallbladder similar to the prior examination. There is likely some early fluid collection development anterior to the right kidney image 7/31 measuring 1.9 by 1.4 by 3.7 cm. Previously described fluid collections near the ostomy are not as well defined on the current study. 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 There is no free intraperitoneal air  or ascites. Small air-fluid collection at the level of the umbilicus is again seen containing air. This collection measures 2.5 x 0.7 by 1.3 cm and has mildly increased in size. Air in the collection is new from prior. - no collection around the ileostomy that is drainable - the best way to try to resolve this is abx and improving nutrition   FEN - Dysphagia 2 diet as tolerated (brother reports patient has difficulty chewing anything that is not soft or pureed).  Protein supplements per dietitian. G tube may be necessary to get adequate enteral nutrition - family currently refusing this. Increasing TPN back to full rate, although patient is taking in more ensure.    VTE - SCDs, Lovenox ID - zosyn 11/27>12/2, 12/5>12/11, restarted 1/11>> Foley - None currently    - encephalopathy/FTT - appreciate consultation at request of Dr. Johnnye Sima. Neurology saw a few weeks ago and signed off. On folic acid and thiamine supplementation. Family requesting further neurologic testing - appreciate Neuro seeing again. Appreciate TRH following as well. Vitamin B6, B1 and E levels pending, PTH normal, serum copper level pending.    - psych saw 12/27 and determined patient does not have capacity   - frequent urination - wick discontinued secondary to pressure wounds but per nursing patient is urinating multiple times per hr and  has difficulty moving secondary to pain in legs. Recent urine culture with multiple species present - UA 1/10 unremarkable, Urine Cx with enterococcus faecalis sensitive to ampicillin, Zosyn should be adequate coverage   Dispo: Patient will need SNF placement at discharge. Appreciate case management sending documents to [facility] in [place].  We will continue to work on disposition with case management and family. Will need to try to coordinate a family meeting early next week where all siblings who would like to be involved may be present either in person or over the phone. Would like for surgery,  TRH, and TOC to all be there as well to discuss progress, next steps and disposition all together. Palliative also willing to be there if we are able to set something up.   LOS: 47 days   I reviewed nursing notes, hospitalist notes, last 24 h vitals and pain scores, last 48 h intake and output, and last 24 h labs and trends.  This care required moderate level of medical decision making.    Norm Parcel, [facility] Surgery 01/03/2022, 9:28 AM Please see Amion for pager number during day hours 7:00am-4:30pm

## 2022-01-03 NOTE — Progress Notes (Addendum)
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 Harlow Asa 11/27.  Per recent MD notes: "Encephalopathy/FTT - Patient has been on folic acid and thiamine supplements. Noted to have poor motor control and coordination the other day by PT which is different from last week per therapy notes, Neurology consulted - appreciated their input. No further workup needed at this time from their standpoint.    - psych saw 12/27 and determined patient does not have capacity. Patient has been worsening since admission.  She was confused from POD 1 on. Question if she was like this prior to admission, but lived alone and no one aware of these issues." Rapid response called 1/12/ for AMS    PT Comments    The  patient is alert, grimaces /moans when LLE is moved and with right but less so, Bilateral plantarflexed ankles. Recommend Prevalon boots for positioning.  Patient  demonstrates poor ability to follow directions, no attempt to move right  extremities. Able to hold cup with left hand once cup placed in hand and fingers placed around the cup.Patient  did not initiate taking cup/straw to mouth,. Required tactile cues to perform. Able to drink from straw.   Patient with significant deconditioning and appears right side paresis vs. Left. Continue PT for mobility and exercises.   Placed bed in upright chair position, Assisted with mouth care and drinking ensure, PROM all extrmities and sitting upright posture, lists to right  with repositioning x 3. BP 101/75 upright.  Recommendations for follow up therapy are one component of a multi-disciplinary discharge planning process, led by the attending physician.  Recommendations may be updated based on patient status,  additional functional criteria and insurance authorization.  Follow Up Recommendations  Skilled nursing-short term rehab (<3 hours/day) LTAC.     Assistance Recommended at Discharge Frequent or constant Supervision/Assistance  Patient can return home with the following     Equipment Recommendations       Recommendations for Other Services       Precautions / Restrictions Precautions Precautions: Fall Precaution Comments: abdominal surgery, RUQ ileostomy Restrictions Weight Bearing Restrictions: No     Mobility  Bed Mobility               General bed mobility comments: patient listing to R side in bed with pillow added for positioning.Bed placed in chair position for 30 minutes. Worked with patient to cleanse mouth, Patient unable to assist using lLUE. RUE flaccid more so. Patient does not attempt to move any  extrmities. does support head, tends left gaze but will look to right with stimulation.    Transfers                   General transfer comment: not attempted d/t poor limited sitting balance    Ambulation/Gait                   Stairs             Wheelchair Mobility    Modified Rankin (Stroke Patients Only)       Balance       Sitting balance - Comments: in bed with back supported, kleans to the  right, gradually migrates back after repostioning x 3. Postural control: Posterior lean  Cognition Arousal/Alertness: Awake/alert Behavior During Therapy: Flat affect Overall Cognitive Status: Impaired/Different from baseline Area of Impairment: Orientation;Attention;Memory;Following commands;Safety/judgement;Awareness;Problem solving                 Orientation Level: Disoriented to;Time;Situation;Place Current Attention Level: Sustained   Following Commands: Follows one step commands inconsistently       General Comments: patient minimally verbalized when  conversing.stated " Wow" when given ensure, only  a few words like OK. Appears different from last encounter where PT documented patient conversed. Followed simple directions inconsistently with much  cues , physical and verbal. Like hold cup, drink from straw, open mouth        Exercises General Exercises - Upper Extremity Shoulder Flexion: PROM;AAROM;Both;10 reps;Supine Elbow Extension: PROM;Both;10 reps Wrist Flexion: PROM;Both;10 reps;Supine General Exercises - Lower Extremity Ankle Circles/Pumps: PROM;Both;10 reps;Supine Heel Slides: PROM;Both;10 reps;Supine    General Comments        Pertinent Vitals/Pain Pain Assessment: PAINAD Faces Pain Scale: Hurts whole lot Pain Location: when moving left leg and foot  also right but less. , stretching right elb Pain Descriptors / Indicators: Grimacing;Guarding;Moaning Pain Intervention(s): Monitored during session;Repositioned    Home Living                          Prior Function            PT Goals (current goals can now be found in the care plan section) Acute Rehab PT Goals Patient Stated Goal: pt unable to speak PT Goal Formulation: Patient unable to participate in goal setting Time For Goal Achievement: 01/17/22 Potential to Achieve Goals: Fair Progress towards PT goals: Not progressing toward goals - comment (appears with  increased right side weakness, poor efforts to mve left  side.)    Frequency    Min 2X/week      PT Plan Current plan remains appropriate    Co-evaluation              AM-PAC PT "6 Clicks" Mobility   Outcome Measure  Help needed turning from your back to your side while in a flat bed without using bedrails?: Total Help needed moving from lying on your back to sitting on the side of a flat bed without using bedrails?: Total Help needed moving to and from a bed to a chair (including a wheelchair)?: Total Help needed standing up from a chair using your arms (e.g., wheelchair  or bedside chair)?: Total Help needed to walk in hospital room?: Total Help needed climbing 3-5 steps with a railing? : Total 6 Click Score: 6    End of Session   Activity Tolerance: Patient limited by pain;Patient tolerated treatment well Patient left: in bed;with call bell/phone within reach;with bed alarm set Nurse Communication: Mobility status;Need for lift equipment PT Visit Diagnosis: Muscle weakness (generalized) (M62.81);Other abnormalities of gait and mobility (R26.89);Other symptoms and signs involving the nervous system (R29.898)     Time: 8657-8469 PT Time Calculation (min) (ACUTE ONLY): 65 min  Charges:  $Therapeutic Activity: 23-37 mins $Self Care/Home Management: 8-22  Re-eval 8-22 mins                    Tresa Endo PT Acute Rehabilitation Services Pager [phone] Office [phone]    Claretha Cooper 01/03/2022, 11:19 AM

## 2022-01-03 NOTE — Progress Notes (Signed)
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 Harlow Asa 11/27.  Per recent MD notes: "Encephalopathy/FTT - Patient has been on folic acid and thiamine supplements. Noted to have poor motor control and coordination the other day by PT which is different from last week per therapy notes, Neurology consulted - appreciated their input. No further workup needed at this time from their standpoint.    - psych saw 12/27 and determined patient does not have capacity. Patient has been worsening since admission.  She was confused from POD 1 on. Question if she was like this prior to admission, but lived alone and no one aware of these issues." Rapid response called 1/12/ for AMS   OT comments  Treatment focused on oral care and therapeutic exercise to bilateral upper extremities. She required max assist for oral care - as she is unable to grasp toothbrush or lift hand to her mouth. She was able to swish and spit x 6 for oral care and oral rinse. She attempts to wipe her face with her left hand - with a slight movement from her hand to partially up her chest. Therapist provided PROM to bilateral shoulders as they are profoundly weak but AAROM to elbows, wrist and fingers - as patient demonstrated activation of movement in those joints She requires increased time and cues to perform task but follows commands consistently with time. Patient exhibited a good grasp on cup with left hand but needed assist to bring to her mouth. Patient still noted to have clonus in right wrist - though less in arm than visit on 1/6 but still significant clonus in right ankle.    Recommendations for follow up therapy are one component of a multi-disciplinary discharge planning process,  led by the attending physician.  Recommendations may be updated based on patient status, additional functional criteria and insurance authorization.    Follow Up Recommendations  Skilled nursing-short term rehab (<3 hours/day)    Assistance Recommended at Discharge Frequent or constant Supervision/Assistance  Patient can return home with the following  A lot of help with bathing/dressing/bathroom;Two people to help with bathing/dressing/bathroom;Two people to help with walking and/or transfers;A lot of help with walking and/or transfers;Assistance with cooking/housework   Equipment Recommendations  None recommended by OT    Recommendations for Other Services      Precautions / Restrictions Precautions Precautions: Fall Precaution Comments: abdominal surgery, RUQ ileostomy Restrictions Weight Bearing Restrictions: No       Mobility Bed Mobility                    Transfers                         Balance                                           ADL either performed or assessed with clinical judgement   ADL   Eating/Feeding: Maximal assistance Eating/Feeding Details (indicate cue type and reason): needs active assist to bring cup to mouth but exhibited a good grasp on cup. Grooming: Oral care;Maximal assistance Grooming Details (indicate cue type and reason): brushed  teeth twice due to dark build up in mouth. Patient unable to hold tooth brush but able swich and spit on command x 6 for oral care and oral rinse. Attempts to bring left hand to mouth to wipe face.                                    Extremity/Trunk Assessment Upper Extremity Assessment RUE Deficits / Details: Clonus still noted in wrist extension but only a small catch and release in elbow. Patient able to grossly open and close fingers and able to activate bicep with active assist. LUE Deficits / Details: Able to open and close fingers, some pain with full  finger extension, activated elbow flexion when attempting to bring towel to mouth but needs active assist to control movement.            Vision Baseline Vision/History: 1 Wears glasses Vision Assessment?: No apparent visual deficits   Perception     Praxis      Cognition Arousal/Alertness: Awake/alert Behavior During Therapy: WFL for tasks assessed/performed Overall Cognitive Status: Impaired/Different from baseline                               Problem Solving: Slow processing;Decreased initiation;Difficulty sequencing;Requires verbal cues;Requires tactile cues General Comments: able to follow most commands with multimodal cues. needs increased time.          Exercises Other Exercises Other Exercises: AAROM for elbow, wrist and finger movement of each arm. Patient able to initiate some with cues. Predominantly PROM for shoulder ROm x 10 each arm.   Shoulder Instructions       General Comments      Pertinent Vitals/ Pain       Pain Assessment: Faces Faces Pain Scale: Hurts little more Pain Location: with touching/movement of extremities Pain Descriptors / Indicators: Grimacing;Moaning Pain Intervention(s): Monitored during session  Home Living                                          Prior Functioning/Environment              Frequency  Min 2X/week        Progress Toward Goals  OT Goals(current goals can now be found in the care plan section)  Progress towards OT goals: Progressing toward goals  Acute Rehab OT Goals Patient Stated Goal: get stronger OT Goal Formulation: With patient Time For Goal Achievement: 01/07/22 Potential to Achieve Goals: Poor  Plan Discharge plan remains appropriate    Co-evaluation          OT goals addressed during session: ADL's and self-care      AM-PAC OT "6 Clicks" Daily Activity     Outcome Measure   Help from another person eating meals?: A Lot Help from another person  taking care of personal grooming?: A Lot Help from another person toileting, which includes using toliet, bedpan, or urinal?: Total Help from another person bathing (including washing, rinsing, drying)?: Total Help from another person to put on and taking off regular upper body clothing?: Total Help from another person to put on and taking off regular lower body clothing?: Total 6 Click Score: 8    End of Session    OT Visit Diagnosis: Unsteadiness  on feet (R26.81);Other symptoms and signs involving cognitive function;Pain;History of falling (Z91.81);Repeated falls (R29.6);Muscle weakness (generalized) (M62.81)   Activity Tolerance Patient tolerated treatment well   Patient Left in bed;with call bell/phone within reach;with bed alarm set   Nurse Communication  (profound weakness, clonus in right ankle, right wrist - oral care performed.)        Time: 5369-2230 OT Time Calculation (min): 20 min  Charges: OT General Charges $OT Visit: 1 Visit OT Treatments $Self Care/Home Management : 8-22 mins  Derl Barrow, OTR/L [place]  Office [phone] Pager: [place] 01/03/2022, 4:33 PM

## 2022-01-04 ENCOUNTER — Inpatient Hospital Stay (HOSPITAL_COMMUNITY): Payer: Medicare HMO

## 2022-01-04 LAB — ALBUMIN
Albumin: 1.8 g/dL — ABNORMAL LOW (ref 3.5–5.0)
Albumin: 1.9 g/dL — ABNORMAL LOW (ref 3.5–5.0)

## 2022-01-04 LAB — CBC WITH DIFFERENTIAL/PLATELET
Abs Immature Granulocytes: 0.03 10*3/uL (ref 0.00–0.07)
Basophils Absolute: 0 10*3/uL (ref 0.0–0.1)
Basophils Relative: 1 %
Eosinophils Absolute: 0.5 10*3/uL (ref 0.0–0.5)
Eosinophils Relative: 6 %
HCT: 27.9 % — ABNORMAL LOW (ref 36.0–46.0)
Hemoglobin: 8.2 g/dL — ABNORMAL LOW (ref 12.0–15.0)
Immature Granulocytes: 0 %
Lymphocytes Relative: 10 %
Lymphs Abs: 0.8 10*3/uL (ref 0.7–4.0)
MCH: 26.7 pg (ref 26.0–34.0)
MCHC: 29.4 g/dL — ABNORMAL LOW (ref 30.0–36.0)
MCV: 90.9 fL (ref 80.0–100.0)
Monocytes Absolute: 0.8 10*3/uL (ref 0.1–1.0)
Monocytes Relative: 10 %
Neutro Abs: 6 10*3/uL (ref 1.7–7.7)
Neutrophils Relative %: 73 %
Platelets: 298 10*3/uL (ref 150–400)
RBC: 3.07 MIL/uL — ABNORMAL LOW (ref 3.87–5.11)
RDW: 16.8 % — ABNORMAL HIGH (ref 11.5–15.5)
WBC: 8.2 10*3/uL (ref 4.0–10.5)
nRBC: 0 % (ref 0.0–0.2)

## 2022-01-04 LAB — BASIC METABOLIC PANEL
Anion gap: 4 — ABNORMAL LOW (ref 5–15)
BUN: 26 mg/dL — ABNORMAL HIGH (ref 8–23)
CO2: 27 mmol/L (ref 22–32)
Calcium: 9.9 mg/dL (ref 8.9–10.3)
Chloride: 112 mmol/L — ABNORMAL HIGH (ref 98–111)
Creatinine, Ser: 0.4 mg/dL — ABNORMAL LOW (ref 0.44–1.00)
GFR, Estimated: >60 mL/min (ref >60)
Glucose, Bld: 166 mg/dL — ABNORMAL HIGH (ref 70–99)
Potassium: 3.6 mmol/L (ref 3.5–5.1)
Sodium: 143 mmol/L (ref 135–145)

## 2022-01-04 LAB — GLUCOSE, CAPILLARY
Glucose-Capillary: 174 mg/dL — ABNORMAL HIGH (ref 70–99)
Glucose-Capillary: 166 mg/dL — ABNORMAL HIGH (ref 70–99)
Glucose-Capillary: 111 mg/dL — ABNORMAL HIGH (ref 70–99)
Glucose-Capillary: 131 mg/dL — ABNORMAL HIGH (ref 70–99)

## 2022-01-04 LAB — PHOSPHORUS: Phosphorus: 2.6 mg/dL (ref 2.5–4.6)

## 2022-01-04 LAB — MAGNESIUM: Magnesium: 2.2 mg/dL (ref 1.7–2.4)

## 2022-01-04 MED ORDER — TRAVASOL 10 % IV SOLN
INTRAVENOUS | Status: AC
  Filled 2022-01-04: qty 1020

## 2022-01-04 NOTE — Progress Notes (Signed)
Consult NOTE    Sherry Hampton  NUU:[id number] DOB: [birth year] DOA: 11/17/2021 PCP: Pcp, No  No chief complaint on file.   Brief Narrative:  66 year old woman according to chart was independent, living alone and driving and continue to work as needed.  PMH including hyperlipidemia, possibly some memory loss, presented 11/27 with sudden abdominal pain.  Underwent emergent surgery for perforated viscus of the ascending colon with fecal peritonitis same day.  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  Hospitalization has been complicated by fluid collections in the abdomen, malnutrition, currently on TNA, intermittent encephalopathy and failure to thrive and progressive decline in strength and ability to participate with physical therapy.    See previous notes    Assessment & Plan:   Principal Problem:   Perforated abdominal viscus Active Problems:   Acute metabolic encephalopathy   Enterococcus UTI   Abnormal neurological exam   Hypercalcemia   Folate deficiency   Protein-calorie malnutrition, severe   Common bile duct dilatation   Memory loss   Delirium   GERD (gastroesophageal reflux disease)   Hyperlipidemia   Hyponatremia   Acute blood loss anemia   Necrosis of colon with perforation    Ileostomy in place [facility])   * Perforated abdominal viscus- (present on admission) --per surgery S/p ex lap, extended right colectomy, end ileostomy, mucous fistula 11/17/2021 CT abd pelvis 1/12 with extravasation of oral contrast into subcutaneous abdominal wall at level of ostomy concerning for bowel leak, inflammatory tissue in R abdomen at level of ostomy extending to the inferior tip of the liver, early fluid collection concerning for phlegmon or abscess, subcutaneous fluid colelction at level of umbilicus increased in size and with air concerning for abscess abx started (1/11 - present) with zosyn Abnormal  imaging findings per surgery -> no collection around ileostomy that is drainable, recommending abx and improving nutrition  Enterococcus UTI On zosyn cx from 3/47/42  Acute metabolic encephalopathy- (present on admission) alert, but obviously confused - better attention than 1/11 Follows commands inconsistently, difficult exam  Exam was very difficult.  Mentation appears well.  See 12/28/2021 initial consult note Sherry Hampton) for review of the chart and extensive work-up. Previously with normal ammonia, TSH, HIV S/p high dose thiamine, continue supplemenation MRI brain 12/21 without acute abnormality Repeat MRI brain ordered MRI C spine with C6-7 severe bilateral neural foraminal narrowing, C5-6 severe L and mild R enural foraminal narrowing, C3-4 and C4-5 mild to moderate L neural foraminal narrowing Neurology evaluated and delirium thought 2/2 prolonged hospitalization, sleep wake disturbance, nutritional def, critical illness Continued encephalopathy multifactorial, related to infection (above - now on abx), prolonged hospitalization, nutritional def, critical illness --- follow with antibiotics Delirium precautions    Abnormal neurological exam Exam difficult, inconsistent - she's moving all extremities today to painful stimuli, but doesn't consistently follow instructions Seen by neurology 01/01/2021, apparently patient refused exam  MRI c spine recommended 12/21, MRI c spine with C6-7 severe bilateral neural foraminal narrowing, C5-6 severe L and mild R enural foraminal narrowing, C3-4 and C4-5 mild to moderate L neural foraminal narrowing Pending copper, vitamin E, vitamin b1, and vitamin b6.  Vitamin D wnl. Continue folate and niacin supplementation per neuro Will get EEG Repeat MRI brain.  Also get MRI T and L spine.  (will stagger these if possible) EMG/NCS will need to be outpatient   Folate deficiency supplementation  Hypercalcemia Calcium 9.9, awaiting albumin for  correction Continue IVF  and follow PTH 44 -> suggests non parathyroid hypercalcemia ? Related to TNA Consider additional w/u  Common bile duct dilatation Noted incidentally on CT scan Bili and alk phos wnl Will defer additional imaging to surgery  Protein-calorie malnutrition, severe Continue nutrition as per surgery  Memory loss According to the chart mild prior to admission, patient was independent and still working at times    DVT prophylaxis: lovenox Code Status: full Family Communication: none at bedside Disposition: per general surgery  General surgery is primary  Procedures:  Exploratory laparotomy, extended right colectomy, end ileostomy Sherry Hampton), and mucous fistula (distal transverse colon) 11/17/2021  Antimicrobials:  Anti-infectives (From admission, onward)    Start     Dose/Rate Route Frequency Ordered Stop   01/01/22 2100  piperacillin-tazobactam (ZOSYN) IVPB 3.375 g        3.375 g 12.5 mL/hr over 240 Minutes Intravenous Every 8 hours 01/01/22 2042     11/25/21 1000  piperacillin-tazobactam (ZOSYN) IVPB 3.375 g  Status:  Discontinued        3.375 g 12.5 mL/hr over 240 Minutes Intravenous Every 8 hours 11/25/21 0909 12/01/21 0822   11/17/21 1830  piperacillin-tazobactam (ZOSYN) IVPB 3.375 g        3.375 g 12.5 mL/hr over 240 Minutes Intravenous Every 8 hours 11/17/21 1415 11/22/21 2359   11/17/21 1230  piperacillin-tazobactam (ZOSYN) IVPB 3.375 g        3.375 g 100 mL/hr over 30 Minutes Intravenous  Once 11/17/21 1220 11/17/21 1304       Subjective: Answers some questions, inconsistent  Objective: Vitals:   01/03/22 2309 01/04/22 0140 01/04/22 0459 01/04/22 0913  BP: 122/70 119/74 130/79 122/83  Pulse: (!) 104 95 91 94  Resp: _0 Temp: 97.9 F (36.6 C) 97.9 F (36.6 C) 97.8 F (36.6 C) 98.1 F (36.7 C)  TempSrc: Oral Oral Oral Oral  SpO2: 96% 99% 97% 100%  Weight:      Height:        Intake/Output Summary (Last 24 hours) at  01/04/2022 1334 Last data filed at 01/04/2022 1000 Gross per 24 hour  Intake 2291.28 ml  Output 2160 ml  Net 131.28 ml   Filed Weights   12/17/21 0500 12/24/21 0500 12/30/21 0635  Weight: 76.5 kg 77.9 kg 73.3 kg    Examination: General: No acute distress. Cardiovascular: RRR Lungs: unlabored Abdomen: ostomy with brown/green stool, midline wound with intact dressing, mucous fistula Neurological: answers some questions, inconsistnetly, follows commands inconsistently Skin: Warm and dry. No rashes or lesions. Extremities: No clubbing or cyanosis. No edema.   Data Reviewed: I have personally reviewed following labs and imaging studies  CBC: Recent Labs  Lab 01/01/22 2257 01/02/22 0356 01/02/22 2144 01/03/22 0445 01/04/22 0540  WBC 11.2* 12.0* 9.9 9.5 8.2  NEUTROABS 8.1* 9.3* 7.9* 7.7 6.0  HGB 8.9* 8.7* 8.1* 8.1* 8.2*  HCT 29.5* 28.6* 27.2* 26.8* 27.9*  MCV 89.1 89.7 89.8 88.7 90.9  PLT 363 343 306 300 831    Basic Metabolic Panel: Recent Labs  Lab 12/31/21 0303 01/01/22 0449 01/02/22 0356 01/02/22 2144 01/03/22 0445 01/04/22 0540  NA 145 142 141 142 142 143  K 4.0 3.7 3.5 3.5 3.7 3.6  CL 111 109 110 113* 108 112*  CO2 _1 GLUCOSE 152* 142* 150* 126* 142* 166*  BUN 24* 31* 25* 25* 22 26*  CREATININE 0.46 0.33* 0.56 0.50 0.48 0.40*  CALCIUM 11.1* 10.6*   10.9* 10.3  10.3 10.3 9.9  MG 2.2 2.1 2.1  --  2.0 2.2  PHOS 3.1 2.6 3.1  --  3.0 2.6    GFR: Estimated Creatinine Clearance: 73.8 mL/min (Denessa Cavan) (by C-G formula based on SCr of 0.4 mg/dL (L)).  Liver Function Tests: Recent Labs  Lab 12/30/21 0550 01/01/22 0449 01/02/22 0356 01/02/22 2144 01/03/22 0445 01/04/22 0532  AST 16  --  21 17  --   --   ALT 15  --  21 17  --   --   ALKPHOS 88  --  91 83  --   --   BILITOT 0.3  --  0.4 0.3  --   --   PROT 7.6  --  7.4 6.9  --   --   ALBUMIN 1.9* 1.8* 1.9* 1.7* 1.7* 1.8*    CBG: Recent Labs  Lab 01/02/22 1601 01/03/22 2159 01/04/22 0137  01/04/22 0729 01/04/22 1154  GLUCAP 104* 162* 174* 166* 111*     Recent Results (from the past 240 hour(s))  Urine Culture     Status: Abnormal   Collection Time: 12/31/21 10:48 AM   Specimen: In/Out Cath Urine  Result Value Ref Range Status   Specimen Description   Final    IN/OUT CATH URINE Performed at [facility], [place] [address] [place], [place] [address]    Special Requests   Final    NONE Performed at [facility], [place] [address] [place], [place] [address]    Culture >=100,000 COLONIES/mL ENTEROCOCCUS FAECALIS (Cora Stetson)  Final   Report Status 01/02/2022 FINAL  Final   Organism ID, Bacteria ENTEROCOCCUS FAECALIS (Fleming Prill)  Final      Susceptibility   Enterococcus faecalis - MIC*    AMPICILLIN <=2 SENSITIVE Sensitive     NITROFURANTOIN <=16 SENSITIVE Sensitive     VANCOMYCIN 1 SENSITIVE Sensitive     * >=100,000 COLONIES/mL ENTEROCOCCUS FAECALIS         Radiology Studies: MR CERVICAL SPINE WO CONTRAST  Result Date: 01/02/2022 CLINICAL DATA:  Ataxia, patient favors left side EXAM: MRI CERVICAL SPINE WITHOUT CONTRAST TECHNIQUE: Multiplanar, multisequence MR imaging of the cervical spine was performed. No intravenous contrast was administered. COMPARISON:  No prior MRI available. FINDINGS: Evaluation is somewhat limited by motion artifact. Alignment: Straightening and mild reversal of the normal cervical lordosis. Trace anterolisthesis C4 on C5. Vertebrae: No acute fracture or suspicious osseous lesion. Cord: Normal signal and morphology. Posterior Fossa, vertebral arteries, paraspinal tissues: Negative. Disc levels: C2-C3: No significant disc bulge. Facet arthropathy. No spinal canal stenosis or neuroforaminal narrowing. C3-C4: Left eccentric disc bulge. Left-greater-than-right uncovertebral hypertrophy. Facet arthropathy. No spinal canal stenosis. Mild-to-moderate left neural foraminal narrowing. C4-C5: Trace anterolisthesis with minimal disc  bulge. Uncovertebral and facet arthropathy. No spinal canal stenosis. Mild-to-moderate left neural foraminal narrowing. C5-C6: Left eccentric disc osteophyte complex and uncovertebral hypertrophy. Mild facet arthropathy. No spinal canal stenosis. Severe left and mild right neural foraminal narrowing. C6-C7: Disc osteophyte complex. Uncovertebral and facet arthropathy. No spinal canal stenosis. Severe bilateral neural foraminal narrowing. C7-T1: No significant disc bulge. No spinal canal stenosis or neuroforaminal narrowing. IMPRESSION: 1. C6-C7 severe bilateral neural foraminal narrowing. 2. C5-C6 severe left and mild right neural foraminal narrowing. 3. C3-C4 and C4-C5 mild-to-moderate left neural foraminal narrowing. 4. No spinal canal stenosis. Electronically Signed   By: Merilyn Baba M.D.   On: 01/02/2022 15:44        Scheduled Meds:  acetaminophen (TYLENOL) oral liquid 160 mg/5 mL  1,000 mg Oral Q6H   alteplase  2 mg Intracatheter Once   chlorhexidine  15 mL Mouth Rinse BID   Chlorhexidine Gluconate Cloth  6 each Topical Daily   enoxaparin (LOVENOX) injection  40 mg Subcutaneous Q24H   feeding supplement  237 mL Oral TID BM   ferrous sulfate  325 mg Oral BID WC   folic acid  1 mg Oral Daily   insulin aspart  0-9 Units Subcutaneous 4 times per day   mouth rinse  15 mL Mouth Rinse q12n4p   mirtazapine  15 mg Oral QHS   multivitamin with minerals  1 tablet Oral Daily   niacin  100 mg Oral QHS   nutrition supplement (JUVEN)  1 packet Oral BID BM   pantoprazole  40 mg Oral QHS   psyllium  1 packet Oral BID   sodium chloride flush  10-40 mL Intracatheter Q12H   sodium hypochlorite   Irrigation TID   thiamine  100 mg Oral BID   Continuous Infusions:  sodium chloride Stopped (12/24/21 1137)   sodium chloride 100 mL/hr at 01/04/22 0220   piperacillin-tazobactam 3.375 g (20/72/18 2883)   TPN CYCLIC-ADULT (ION)       LOS: 48 days    Time spent: over 30 min    Fayrene Helper,  MD [facility]   To contact the attending provider between 7A-7P or the covering provider during after hours 7P-7A, please log into the web site [email or web address] and access using universal [place] password for that web site. If you do not have the password, please call the hospital operator.  01/04/2022, 1:34 PM

## 2022-01-04 NOTE — Progress Notes (Addendum)
PHARMACY - TOTAL PARENTERAL NUTRITION CONSULT NOTE   Indication:  Inability to meet enteral needs  Patient Measurements: Height: 5\' 10"  (177.8 cm) Weight: 73.3 kg (161 lb 9.6 oz) IBW/kg (Calculated) : 68.5 TPN AdjBW (KG): 79.8 Body mass index is 23.19 kg/m. Usual Weight: 80 kg  Assessment: 66 y.o. 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 She is now extended right colectomy and ileostomy. She developed acute metabolic encephalopathy post-op. She was initiated on TF, but tube became clogged on 12/8 and patient refused replacement, and is not eating enough to maintain nutrition.  Calorie count was extended to end 1/5 PM as patient did not consume 3 full meals on 1/4.  CCS ordered TPN to start on 12/12, pharmacy to manage.  Family does not wish to proceed with G-tube placement.  CCS made request on 01/02/22 to titrate TPN from half rate to full rate since pt has poor oral intake and not doing tube feed.  Glucose / Insulin: No hx DM.   - CBGs:  162-174 while on increased cyclic TPN (2/35-3/61) Electrolytes:  Elytes WNL except Cl elevated (despite max Acetate), CorrCa elevated at 10.78 (no Ca in TPN) - K 3.6 and Phos 2.6 near lower limit Renal: SCr <1 stable, BUN 26 Hepatic: LFTs WNL, Albumin low Trig: WNL (1/9) Intake / Output: net I/O +930 mL - UOP: 1100 mL - stool:  800 ml MIVF: NS 100 ml/hr GI Imaging:  - 12/27 abd CT: Postsurgical changes with a right lower quadrant ileostomy and left colostomy. No bowel obstruction. 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 Interval decrease in the size of the fluid density inferior to the gallbladder. No drainable fluid collection Identified. Complex collection in the deep subcutaneous soft  tissues of theanterior abdominal wall along the midline extending to the skin at the level of the umbilicus - 1/1 abd CT: There is again noted wall thickening and inflammatory changes involving small bowel loops in the right lower quadrant near the ileostomy suggesting enteritis with surrounding inflammation - 1/11 abd CT: Inflammatory tissue in the right abdomen at the level of the ostomy extending to the inferior tip the liver has mildly increased. There is new likely early fluid collection development anterior to the right kidney worrisome for phlegmon or abscess. Subcutaneous fluid collection at the level of the umbilicus has increased in size and now contains air worrisome for abscess.  GI Surgeries / Procedures:  11/27: Expiratory laparotomy, extended right colectomy, end ileostomy  Central access: PICC placed 12/11 TPN start date: 12/12  Nutritional Goals: Cyclic TPN at full rate with volume of 2040 mL will provide 102 gm protein and [address] Assessment: Estimated Needs Total Energy Estimated Needs: 1950-2150 Total Protein Estimated Needs: 95-105g Total Fluid Estimated Needs: 2L/day  Current Nutrition:  Regular diet- Minimal intake, patient noted to be pocketing food and will spit out - Ensure Enlive TID (20 g of protein and 350 kcal)  - Juven supplement packet (2.5g of protein and 95 kcal)  - cyclic TPN   Plan:  Increase to full rate cyclic TPN: 4431 mL over 14 hrs at 18:00 today.   Electrolytes in TPN. Na 40mEq/L K 70 mEq/L Ca 0 mEq/L Mg 7 mEq/L Phos 22 mmol/L Cl:Ac max Ac Continue multivitamin PO; Thiamine PO BID per neurology Sensitive SSI 4x a day based  cbgs drawn: 2 hrs past cyclic TPN start + 1 hr past cycle TPN d/ced + middle of TPN infusion + while off TPN (8p, 2a, 9a, noon) Monitor TPN labs on Mon/Thurs Bmet, phos and Mag on 01/05/22   Gretta Arab PharmD, BCPS Clinical Pharmacist [facility] main pharmacy [phone] 01/04/2022 11:13 AM

## 2022-01-04 NOTE — Progress Notes (Signed)
Progress Note  48 Days Post-Op  Subjective: Events of overnight reviewed.     Objective: Vital signs in last 24 hours: Temp:  [97.6 F (36.4 C)-98.3 F (36.8 C)] 97.8 F (36.6 C) (01/14 0459) Pulse Rate:  [88-119] 91 (01/14 0459) Resp:  [15-20] 18 (01/14 0459) BP: (106-130)/(65-79) 130/79 (01/14 0459) SpO2:  [96 %-99 %] 97 % (01/14 0459) Last BM Date: 01/03/22  Intake/Output from previous day: 01/13 0701 - 01/14 0700 In: 2829.1 [P.O.:600; I.V.:1964.2; IV Piggyback:264.9] Out: 1900 [Urine:1100; Stool:800] Intake/Output this shift: Total I/O In: -  Out: 60 [Stool:60]  PE: General: resting, NAD Resp: normal work of breathing Abdomen: soft, nondistended, nontender to palpation. Midline incision with purulent drainage noted, wound probed and 7.5 cm tract in inferior wound that extends inferiolaterally toward ileostomy, 1.5 cm tract in superior wound that extends superiorly. Ileostomy productive of oatmeal consistency stool. 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 Extremities: warm and well-perfused Neuro: FC, no focal deficits this AM but generalized weakness   Lab Results:  Recent Labs    01/03/22 0445 01/04/22 0540  WBC 9.5 8.2  HGB 8.1* 8.2*  HCT 26.8* 27.9*  PLT 300 298    BMET Recent Labs    01/03/22 0445 01/04/22 0540  NA 142 143  K 3.7 3.6  CL 108 112*  CO2 26 27  GLUCOSE 142* 166*  BUN 22 26*  CREATININE 0.48 0.40*  CALCIUM 10.3 9.9    PT/INR No results for input(s): LABPROT, INR in the last 72 hours. CMP     Component Value Date/Time   NA 143 01/04/2022 0540   K 3.6 01/04/2022 0540   CL 112 (H) 01/04/2022 0540   CO2 27 01/04/2022 0540   GLUCOSE 166 (H) 01/04/2022 0540   BUN 26 (H) 01/04/2022 0540   CREATININE 0.40 (L) 01/04/2022 0540   CALCIUM 9.9 01/04/2022 0540   CALCIUM 10.9 (H) 01/01/2022 0449   PROT 6.9 01/02/2022 2144   ALBUMIN 1.7 (L) 01/03/2022 0445   AST 17 01/02/2022 2144   ALT 17 01/02/2022 2144   ALKPHOS 83  01/02/2022 2144   BILITOT 0.3 01/02/2022 2144   GFRNONAA >60 01/04/2022 0540   Lipase     Component Value Date/Time   LIPASE 22 11/17/2021 0955       Studies/Results: MR CERVICAL SPINE WO CONTRAST  Result Date: 01/02/2022 CLINICAL DATA:  Ataxia, patient favors left side EXAM: MRI CERVICAL SPINE WITHOUT CONTRAST TECHNIQUE: Multiplanar, multisequence MR imaging of the cervical spine was performed. No intravenous contrast was administered. COMPARISON:  No prior MRI available. FINDINGS: Evaluation is somewhat limited by motion artifact. Alignment: Straightening and mild reversal of the normal cervical lordosis. Trace anterolisthesis C4 on C5. Vertebrae: No acute fracture or suspicious osseous lesion. Cord: Normal signal and morphology. Posterior Fossa, vertebral arteries, paraspinal tissues: Negative. Disc levels: C2-C3: No significant disc bulge. Facet arthropathy. No spinal canal stenosis or neuroforaminal narrowing. C3-C4: Left eccentric disc bulge. Left-greater-than-right uncovertebral hypertrophy. Facet arthropathy. No spinal canal stenosis. Mild-to-moderate left neural foraminal narrowing. C4-C5: Trace anterolisthesis with minimal disc bulge. Uncovertebral and facet arthropathy. No spinal canal stenosis. Mild-to-moderate left neural foraminal narrowing. C5-C6: Left eccentric disc osteophyte complex and uncovertebral hypertrophy. Mild facet arthropathy. No spinal canal stenosis. Severe left and mild right neural foraminal narrowing. C6-C7: Disc osteophyte complex. Uncovertebral and facet arthropathy. No spinal canal stenosis. Severe bilateral neural foraminal narrowing. C7-T1: No significant disc bulge. No spinal canal stenosis or neuroforaminal narrowing. IMPRESSION: 1. C6-C7 severe bilateral neural  foraminal narrowing. 2. C5-C6 severe left and mild right neural foraminal narrowing. 3. C3-C4 and C4-C5 mild-to-moderate left neural foraminal narrowing. 4. No spinal canal stenosis. Electronically  Signed   By: Merilyn Baba M.D.   On: 01/02/2022 15:44    Anti-infectives: Anti-infectives (From admission, onward)    Start     Dose/Rate Route Frequency Ordered Stop   01/01/22 2100  piperacillin-tazobactam (ZOSYN) IVPB 3.375 g        3.375 g 12.5 mL/hr over 240 Minutes Intravenous Every 8 hours 01/01/22 2042     11/25/21 1000  piperacillin-tazobactam (ZOSYN) IVPB 3.375 g  Status:  Discontinued        3.375 g 12.5 mL/hr over 240 Minutes Intravenous Every 8 hours 11/25/21 0909 12/01/21 0822   11/17/21 1830  piperacillin-tazobactam (ZOSYN) IVPB 3.375 g        3.375 g 12.5 mL/hr over 240 Minutes Intravenous Every 8 hours 11/17/21 1415 11/22/21 2359   11/17/21 1230  piperacillin-tazobactam (ZOSYN) IVPB 3.375 g        3.375 g 100 mL/hr over 30 Minutes Intravenous  Once 11/17/21 1220 11/17/21 1304        Assessment/Plan 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 Harlow Asa 11/27 - path: necrotic colon at site of perforation, no malignancy or dysplasia noted  - Ileostomy output improved. Continue BID metamucil.  - WOC following for ileostomy, mucous fistula - Wound with blue-green, musty smelling drainage 1/11 - Dakin's TID x72 hrs. Wound probed and deeper tract opened after CT 1/11 with return of purulent drainage.   - upon DC of Dakin's would like TID dry dressing to help absorb drainage  - Pathology benign. CT distal descending/proximal sigmoid bowel luminal collapse. - CT 1/11 - some extravasated oral contrast in the subcutaneous abdominal wall superior and lateral to the ostomy at this level. There is inflammatory tissue at the level of the ostomy extending superiorly abutting the inferior tip of the liver and fundus of the gallbladder similar to the prior examination. There is likely some early fluid collection development anterior to the right kidney image 7/31 measuring 1.9 by 1.4 by 3.7 cm. Previously  described fluid collections near the ostomy are not as well defined on the current study. 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 There is no free intraperitoneal air or ascites. Small air-fluid collection at the level of the umbilicus is again seen containing air. This collection measures 2.5 x 0.7 by 1.3 cm and has mildly increased in size. Air in the collection is new from prior. - no collection around the ileostomy that is drainable - the best way to try to resolve this is abx and improving nutrition   FEN - Dysphagia 2 diet as tolerated (brother reports patient has difficulty chewing anything that is not soft or pureed).  Protein supplements per dietitian. G tube may be necessary to get adequate enteral nutrition - family currently refusing this. TPN at full rate, although patient is taking in more ensure.    VTE - SCDs, Lovenox ID - zosyn 11/27>12/2, 12/5>12/11, restarted 1/11>> Foley - None currently    - encephalopathy/FTT - appreciate consultation at request of Dr. Johnnye Sima. Neurology saw a few weeks ago and signed off. On folic acid and thiamine supplementation. Family requesting further neurologic testing - appreciate Neuro seeing again. Appreciate TRH following as well. Vitamin  B6, B1 and E levels pending, PTH normal, serum copper level pending.    - psych saw 12/27 and determined patient does not have capacity   - frequent urination - wick discontinued secondary to pressure wounds but per nursing patient is urinating multiple times per hr and has difficulty moving secondary to pain in legs. Recent urine culture with multiple species present - UA 1/10 unremarkable, Urine Cx with enterococcus faecalis sensitive to ampicillin, Zosyn should be adequate coverage   Dispo: Patient will need SNF placement at discharge. Appreciate case management sending documents to [facility] in [place].  We  will continue to work on disposition with case management and family.    LOS: 48 days   I reviewed nursing notes, hospitalist notes, last 24 h vitals and pain scores, last 48 h intake and output, and last 24 h labs and trends.  This care required moderate level of medical decision making.    Rosario Adie, [facility] 01/04/2022, 8:53 AM Please see Amion for pager number during day hours 7:00am-4:30pm

## 2022-01-05 ENCOUNTER — Inpatient Hospital Stay (HOSPITAL_COMMUNITY): Payer: Medicare HMO

## 2022-01-05 LAB — GLUCOSE, CAPILLARY
Glucose-Capillary: 177 mg/dL — ABNORMAL HIGH (ref 70–99)
Glucose-Capillary: 135 mg/dL — ABNORMAL HIGH (ref 70–99)
Glucose-Capillary: 102 mg/dL — ABNORMAL HIGH (ref 70–99)
Glucose-Capillary: 120 mg/dL — ABNORMAL HIGH (ref 70–99)
Glucose-Capillary: 146 mg/dL — ABNORMAL HIGH (ref 70–99)

## 2022-01-05 LAB — CBC WITH DIFFERENTIAL/PLATELET
Abs Immature Granulocytes: 0.04 10*3/uL (ref 0.00–0.07)
Basophils Absolute: 0 10*3/uL (ref 0.0–0.1)
Basophils Relative: 0 %
Eosinophils Absolute: 0.6 10*3/uL — ABNORMAL HIGH (ref 0.0–0.5)
Eosinophils Relative: 7 %
HCT: 25.4 % — ABNORMAL LOW (ref 36.0–46.0)
Hemoglobin: 7.5 g/dL — ABNORMAL LOW (ref 12.0–15.0)
Immature Granulocytes: 1 %
Lymphocytes Relative: 12 %
Lymphs Abs: 0.9 10*3/uL (ref 0.7–4.0)
MCH: 27.2 pg (ref 26.0–34.0)
MCHC: 29.5 g/dL — ABNORMAL LOW (ref 30.0–36.0)
MCV: 92 fL (ref 80.0–100.0)
Monocytes Absolute: 0.7 10*3/uL (ref 0.1–1.0)
Monocytes Relative: 9 %
Neutro Abs: 5.5 10*3/uL (ref 1.7–7.7)
Neutrophils Relative %: 71 %
Platelets: 266 10*3/uL (ref 150–400)
RBC: 2.76 MIL/uL — ABNORMAL LOW (ref 3.87–5.11)
RDW: 16.7 % — ABNORMAL HIGH (ref 11.5–15.5)
WBC: 7.7 10*3/uL (ref 4.0–10.5)
nRBC: 0 % (ref 0.0–0.2)

## 2022-01-05 LAB — COMPREHENSIVE METABOLIC PANEL
ALT: 18 U/L (ref 0–44)
AST: 19 U/L (ref 15–41)
Albumin: 1.7 g/dL — ABNORMAL LOW (ref 3.5–5.0)
Alkaline Phosphatase: 74 U/L (ref 38–126)
Anion gap: 3 — ABNORMAL LOW (ref 5–15)
BUN: 23 mg/dL (ref 8–23)
CO2: 27 mmol/L (ref 22–32)
Calcium: 9.4 mg/dL (ref 8.9–10.3)
Chloride: 114 mmol/L — ABNORMAL HIGH (ref 98–111)
Creatinine, Ser: 0.4 mg/dL — ABNORMAL LOW (ref 0.44–1.00)
GFR, Estimated: >60 mL/min (ref >60)
Glucose, Bld: 150 mg/dL — ABNORMAL HIGH (ref 70–99)
Potassium: 3.7 mmol/L (ref 3.5–5.1)
Sodium: 144 mmol/L (ref 135–145)
Total Bilirubin: 0.3 mg/dL (ref 0.3–1.2)
Total Protein: 6.7 g/dL (ref 6.5–8.1)

## 2022-01-05 LAB — COPPER, SERUM: Copper: 126 ug/dL (ref 80–158)

## 2022-01-05 LAB — MAGNESIUM: Magnesium: 2.2 mg/dL (ref 1.7–2.4)

## 2022-01-05 LAB — PHOSPHORUS: Phosphorus: 2.4 mg/dL — ABNORMAL LOW (ref 2.5–4.6)

## 2022-01-05 MED ORDER — TRAVASOL 10 % IV SOLN
INTRAVENOUS | Status: AC
  Filled 2022-01-05: qty 1020

## 2022-01-05 NOTE — Plan of Care (Signed)
  Problem: Pain Managment: Goal: General experience of comfort will improve Outcome: Progressing   

## 2022-01-05 NOTE — Progress Notes (Signed)
PHARMACY - TOTAL PARENTERAL NUTRITION CONSULT NOTE   Indication:  Inability to meet enteral needs  Patient Measurements: Height: 5\' 10"  (177.8 cm) Weight: 73.3 kg (161 lb 9.6 oz) IBW/kg (Calculated) : 68.5 TPN AdjBW (KG): 79.8 Body mass index is 23.19 kg/m. Usual Weight: 80 kg  Assessment: 66 y.o. 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 She is now extended right colectomy and ileostomy. She developed acute metabolic encephalopathy post-op. She was initiated on TF, but tube became clogged on 12/8 and patient refused replacement, and is not eating enough to maintain nutrition.  Calorie count was extended to end 1/5 PM as patient did not consume 3 full meals on 1/4.  CCS ordered TPN to start on 12/12, pharmacy to manage.  Family does not wish to proceed with G-tube placement.  CCS made request on 01/02/22 to titrate TPN from half rate to full rate since pt has poor oral intake and not doing tube feed.  Glucose / Insulin: No hx DM.   - CBGs:  111-177, controlled while on full rate cyclic TPN, no hypoglycemia noted while cyclic TPN off Electrolytes:  Elytes WNL except Phos low (despite increased dose), CorrCa elevated at 11.24 (no Ca in TPN) - Cl elevated/increasing (despite max Acetate) Renal: SCr <1 stable, BUN 23 Hepatic: LFTs WNL, Albumin low Trig: WNL (1/9) Intake / Output: net I/O +600 mL - UOP: 1050 mL - stool:  output decreased to 310 ml MIVF: NS 100 ml/hr (continue IVF per Dr Florene Glen on 1/15 for hypercalcemia) GI Imaging:  - 12/27 abd CT: Postsurgical changes with a right lower quadrant ileostomy and left colostomy. No bowel obstruction. 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 Interval decrease in the size of the  fluid density inferior to the gallbladder. No drainable fluid collection Identified. Complex collection in the deep subcutaneous soft tissues of theanterior abdominal wall along the midline extending to the skin at the level of the umbilicus - 1/1 abd CT: There is again noted wall thickening and inflammatory changes involving small bowel loops in the right lower quadrant near the ileostomy suggesting enteritis with surrounding inflammation - 1/11 abd CT: Inflammatory tissue in the right abdomen at the level of the ostomy extending to the inferior tip the liver has mildly increased. There is new likely early fluid collection development anterior to the right kidney worrisome for phlegmon or abscess. Subcutaneous fluid collection at the level of the umbilicus has increased in size and now contains air worrisome for abscess.  GI Surgeries / Procedures:  11/27: Expiratory laparotomy, extended right colectomy, end ileostomy  Central access: PICC placed 12/11 TPN start date: 12/12  Nutritional Goals: Cyclic TPN at full rate with volume of 2040 mL will provide 102 gm protein and 1998 Kcal  RD Assessment: Estimated Needs Total Energy Estimated Needs: 1950-2150 Total Protein Estimated Needs: 95-105g Total Fluid Estimated Needs: 2L/day  Current Nutrition:  Regular diet- Minimal intake, patient noted to be pocketing food and will spit out - Ensure Enlive TID (20 g of protein and 350 kcal)  - Juven supplement packet (2.5g of protein and 95 kcal)  - cyclic TPN   Plan:  Continue full rate cyclic TPN: 5027 mL over 14 hrs.   Electrolytes in TPN. Na 73mEq/L K 70 mEq/L Ca 0 mEq/L Mg 7 mEq/L Phos 27 mmol/L Cl:Ac max Ac  Continue multivitamin PO; Thiamine PO BID per neurology Sensitive SSI 4x a day based cbgs drawn: 2 hrs past cyclic TPN start + 1 hr past cycle TPN d/ced + middle of TPN infusion + while off TPN (8p, 2a, 9a, noon) Monitor TPN labs on Mon/Thurs   Gretta Arab PharmD,  BCPS Clinical Pharmacist [facility] main pharmacy [phone] 01/05/2022 9:06 AM

## 2022-01-05 NOTE — Progress Notes (Signed)
Consult NOTE    Sherry Hampton  QMV:[id number] DOB: [birth year] DOA: 11/17/2021 PCP: Pcp, No  No chief complaint on file.   Brief Narrative:  67 year old woman according to chart was independent, living alone and driving and continue to work as needed.  PMH including hyperlipidemia, possibly some memory loss, presented 11/27 with sudden abdominal pain.  Underwent emergent surgery for perforated viscus of the ascending colon with fecal peritonitis same day.  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  Hospitalization has been complicated by fluid collections in the abdomen, malnutrition, currently on TNA, intermittent encephalopathy and failure to thrive and progressive decline in strength and ability to participate with physical therapy.    See previous notes    Assessment & Plan:   Principal Problem:   Perforated abdominal viscus Active Problems:   Acute metabolic encephalopathy   Enterococcus UTI   Abnormal neurological exam   Hypercalcemia   Folate deficiency   Protein-calorie malnutrition, severe   Common bile duct dilatation   Memory loss   Delirium   GERD (gastroesophageal reflux disease)   Hyperlipidemia   Hyponatremia   Acute blood loss anemia   Necrosis of colon with perforation    Ileostomy in place [facility])   * Perforated abdominal viscus- (present on admission) --per surgery S/p ex lap, extended right colectomy, end ileostomy, mucous fistula 11/17/2021 CT abd pelvis 1/12 with extravasation of oral contrast into subcutaneous abdominal wall at level of ostomy concerning for bowel leak, inflammatory tissue in R abdomen at level of ostomy extending to the inferior tip of the liver, early fluid collection concerning for phlegmon or abscess, subcutaneous fluid colelction at level of umbilicus increased in size and with air concerning for abscess abx started (1/11 - present) with zosyn - of  note, she's on scheduled tylenol (receives this intermittently and continues to have elevated temperatures (99.8-100 occasionally over past few days).  Tylenol maybe suppressing any true fevers (difficult to tell as sometimes she doesn't receive this).  Abnormal imaging findings per surgery -> no collection around ileostomy that is drainable, recommending abx and improving nutrition  Enterococcus UTI On zosyn cx from 2/84/13  Acute metabolic encephalopathy- (present on admission) alert, but obviously confused - [name] little better attention again today, but still following commands inconsistently, declined further evaluation after [name] point today See 12/28/2021 initial consult note [name]) for review of the chart and extensive work-up. Previously with normal ammonia, TSH, HIV S/p high dose thiamine, continue supplemenation MRI brain 12/21 without acute abnormality Repeat MRI brain 1/15 without acute findings EEG pending MRI C spine with C6-7 severe bilateral neural foraminal narrowing, C5-6 severe L and mild R enural foraminal narrowing, C3-4 and C4-5 mild to moderate L neural foraminal narrowing Neurology evaluated and delirium thought 2/2 prolonged hospitalization, sleep wake disturbance, nutritional def, critical illness Continued encephalopathy multifactorial, related to infection (above - now on abx), prolonged hospitalization, nutritional def, critical illness --- follow with antibiotics Delirium precautions    Abnormal neurological exam Exam difficult, inconsistent - she's moving all extremities todayi, but doesn't consistently follow instructions or participate Seen by neurology 01/01/2021, apparently patient refused exam  MRI c spine recommended 12/21, MRI c spine with C6-7 severe bilateral neural foraminal narrowing, C5-6 severe L and mild R enural foraminal narrowing, C3-4 and C4-5 mild to moderate L neural foraminal narrowing MRI brain without acute abnormality MRI T and L spine done  as well without findings that I think  explain her weakness, will review with neuro °Pending copper, vitamin E, vitamin b1, and vitamin b6.  Vitamin D wnl. °Continue folate and niacin supplementation per neuro °Will get EEG °EMG/NCS will need to be outpatient ° ° °Folate deficiency °supplementation ° °Hypercalcemia °Calcium 9.4, corrects to 11.2 °Continue IVF and follow °PTH 44 -> suggests non parathyroid hypercalcemia °Consider additional w/u -> follow PTHrp, vitamin D 1, 25 ° °Common bile duct dilatation °Noted incidentally on CT scan °Bili and alk phos wnl °Will defer additional imaging to surgery ° °Protein-calorie malnutrition, severe °Continue nutrition as per surgery ° °Memory loss °According to the chart mild prior to admission, patient was independent and still working at times ° ° °DVT prophylaxis: lovenox °Code Status: full °Family Communication: none at bedside °Disposition: per general surgery ° °General surgery is primary ° °Procedures:  °Exploratory laparotomy, extended right colectomy, end ileostomy ([place]), and mucous fistula (distal transverse colon) 11/17/2021 ° °Antimicrobials:  °Anti-infectives (From admission, onward)  ° ° Start     Dose/Rate Route Frequency Ordered Stop  ° 01/01/22 2100  piperacillin-tazobactam (ZOSYN) IVPB 3.375 g       ° 3.375 g °12.5 mL/hr over 240 Minutes Intravenous Every 8 hours 01/01/22 2042    ° 11/25/21 1000  piperacillin-tazobactam (ZOSYN) IVPB 3.375 g  Status:  Discontinued       ° 3.375 g °12.5 mL/hr over 240 Minutes Intravenous Every 8 hours 11/25/21 0909 12/01/21 0822  ° 11/17/21 1830  piperacillin-tazobactam (ZOSYN) IVPB 3.375 g       ° 3.375 g °12.5 mL/hr over 240 Minutes Intravenous Every 8 hours 11/17/21 1415 11/22/21 2359  ° 11/17/21 1230  piperacillin-tazobactam (ZOSYN) IVPB 3.375 g       ° 3.375 g °100 mL/hr over 30 Minutes Intravenous  Once 11/17/21 1220 11/17/21 1304  ° °  ° ° °Subjective: °Again, answers questions,  inconsistently ° °Objective: °Vitals:  ° 01/04/22 2102 01/05/22 0434 01/05/22 1223 01/05/22 1334  °BP: 124/72 133/74  108/73  °Pulse: 90 88  96  °Resp: 18 16  18  °Temp: 98.7 °F (37.1 °C) 97.6 °F (36.4 °C) 98.8 °F (37.1 °C) 97.7 °F (36.5 °C)  °TempSrc: Oral Oral  Oral  °SpO2: 98% 97%  100%  °Weight:      °Height:      ° ° °Intake/Output Summary (Last 24 hours) at 01/05/2022 1439 °Last data filed at 01/05/2022 1400 °Gross per 24 hour  °Intake 1990.95 ml  °Output 2400 ml  °Net -409.05 ml  ° °Filed Weights  ° 12/17/21 0500 12/24/21 0500 12/30/21 0635  °Weight: 76.5 kg 77.9 kg 73.3 kg  ° ° °Examination: °General: No acute distress. °Cardiovascular: RRR °Lungs: unlabored °Abdomen: ostomy with brown stool, midline dressing intact, mucous fistula °Neurological: alert, able to have a bit more of a conversation today, still inconsistently following commands and confused, during part of my exam, she says she doesn't want to participate anymore and says she's done (politely) °Skin: Warm and dry. No rashes or lesions. °Extremities: No clubbing or cyanosis. No edema.  ° °Data Reviewed: I have personally reviewed following labs and imaging studies ° °CBC: °Recent Labs  °Lab 01/02/22 °0356 01/02/22 °2144 01/03/22 °0445 01/04/22 °0540 01/05/22 °0800  °WBC 12.0* 9.9 9.5 8.2 7.7  °NEUTROABS 9.3* 7.9* 7.7 6.0 5.5  °HGB 8.7* 8.1* 8.1* 8.2* 7.5*  °HCT 28.6* 27.2* 26.8* 27.9* 25.4*  °MCV 89.7 89.8 88.7 90.9 92.0  °PLT 343 306 300 298 266  ° ° °Basic Metabolic Panel: °Recent Labs  °  Lab 01/01/22 0449 01/02/22 0356 01/02/22 2144 01/03/22 0445 01/04/22 0540 01/05/22 0800  NA 142 141 142 142 143 144  K 3.7 3.5 3.5 3.7 3.6 3.7  CL 109 110 113* 108 112* 114*  CO2 _0 GLUCOSE 142* 150* 126* 142* 166* 150*  BUN 31* 25* 25* 22 26* 23  CREATININE 0.33* 0.56 0.50 0.48 0.40* 0.40*  CALCIUM 10.6*   10.9* 10.3 10.3 10.3 9.9 9.4  MG 2.1 2.1  --  2.0 2.2 2.2  PHOS 2.6 3.1  --  3.0 2.6 2.4*    GFR: Estimated Creatinine  Clearance: 73.8 mL/min (Samatha Anspach) (by C-G formula based on SCr of 0.4 mg/dL (L)).  Liver Function Tests: Recent Labs  Lab 12/30/21 0550 01/01/22 0449 01/02/22 0356 01/02/22 2144 01/03/22 0445 01/04/22 0532 01/04/22 1540 01/05/22 0800  AST 16  --  21 17  --   --   --  19  ALT 15  --  21 17  --   --   --  18  ALKPHOS 88  --  91 83  --   --   --  74  BILITOT 0.3  --  0.4 0.3  --   --   --  0.3  PROT 7.6  --  7.4 6.9  --   --   --  6.7  ALBUMIN 1.9*   < > 1.9* 1.7* 1.7* 1.8* 1.9* 1.7*   < > = values in this interval not displayed.    CBG: Recent Labs  Lab 01/04/22 1950 01/05/22 0122 01/05/22 0744 01/05/22 1246 01/05/22 1343  GLUCAP 131* 177* 135* 102* 120*     Recent Results (from the past 240 hour(s))  Urine Culture     Status: Abnormal   Collection Time: 12/31/21 10:48 AM   Specimen: In/Out Cath Urine  Result Value Ref Range Status   Specimen Description   Final    IN/OUT CATH URINE Performed at [facility], [place] [address], [place], [place] [address]    Special Requests   Final    NONE Performed at [facility], [place] [address], [place], [place] [address]    Culture >=100,000 COLONIES/mL ENTEROCOCCUS FAECALIS (Sahmir Weatherbee)  Final   Report Status 01/02/2022 FINAL  Final   Organism ID, Bacteria ENTEROCOCCUS FAECALIS (Esley Brooking)  Final      Susceptibility   Enterococcus faecalis - MIC*    AMPICILLIN <=2 SENSITIVE Sensitive     NITROFURANTOIN <=16 SENSITIVE Sensitive     VANCOMYCIN 1 SENSITIVE Sensitive     * >=100,000 COLONIES/mL ENTEROCOCCUS FAECALIS         Radiology Studies: MR BRAIN WO CONTRAST  Result Date: 01/05/2022 CLINICAL DATA:  Neuro deficit, acute, stroke suspected EXAM: MRI HEAD WITHOUT CONTRAST TECHNIQUE: Multiplanar, multiecho pulse sequences of the brain and surrounding structures were obtained without intravenous contrast. COMPARISON:  12/11/2021 FINDINGS: Motion artifact is present and is significant on several sequences.  Brain: There is no acute infarction or intracranial hemorrhage. There is no intracranial mass, mass effect, or edema. There is no hydrocephalus or extra-axial fluid collection. Stable prominence of the ventricles and sulci reflecting parenchymal volume loss. Patchy and confluent areas of T2 hyperintensity in the supratentorial white matter are nonspecific but probably reflects stable chronic microvascular ischemic changes. Vascular: Major vessel flow voids at the skull base are preserved. Skull and upper cervical spine: Normal marrow signal is preserved. Sinuses/Orbits: Minor mucosal thickening.  Orbits are unremarkable. Other: Sella is  unremarkable.  Mastoid air cells are clear. IMPRESSION: No evidence of recent infarction, hemorrhage, or mass. Chronic microvascular ischemic changes. No substantial change from recent prior study. Electronically Signed   By: Praneil  Patel M.D.   On: 01/05/2022 11:13  ° °MR THORACIC SPINE WO CONTRAST ° °Result Date: 01/04/2022 °CLINICAL DATA:  Weakness, lower extremity pain.  Scoliosis EXAM: MRI THORACIC SPINE WITHOUT CONTRAST TECHNIQUE: Multiplanar, multisequence MR imaging of the thoracic spine was performed. No intravenous contrast was administered. COMPARISON:  None. FINDINGS: Alignment: Dextroscoliotic curvature of the midthoracic spine. No static listhesis within the sagittal plane. Vertebrae: No fracture, evidence of discitis, or bone lesion. Cord:  Normal signal and morphology. Paraspinal and other soft tissues: Negative. Disc levels: Intervertebral discs of the thoracic spine are within normal limits. No focal disc protrusion. Mild left-sided facet arthropathy at T10-11 and T11-12. No foraminal or canal stenosis at any level within the thoracic spine. There is prominence of the posterior epidural fat throughout the thoracic spine, although does not contribute to significant canal stenosis. IMPRESSION: 1. Dextroscoliotic curvature of the midthoracic spine. 2. Mild left-sided  facet arthropathy at T10-11 and T11-12. No foraminal or canal stenosis at any level within the thoracic spine. 3. Prominence of the posterior epidural fat throughout the thoracic spine, although does not contribute to significant canal stenosis. Electronically Signed   By: Nicholas  Plundo D.O.   On: 01/04/2022 16:42  ° °MR LUMBAR SPINE WO CONTRAST ° °Result Date: 01/04/2022 °CLINICAL DATA:  Weakness lower extremity EXAM: MRI LUMBAR SPINE WITHOUT CONTRAST TECHNIQUE: Multiplanar, multisequence MR imaging of the lumbar spine was performed. No intravenous contrast was administered. COMPARISON:  None. FINDINGS: Segmentation:  Standard. Alignment:  Anteroposterior alignment is maintained. Vertebrae: Mildly decreased T1 marrow signal, which may reflect hematopoietic marrow. Vertebral body heights are maintained. Conus medullaris and cauda equina: Conus extends to the L1-L2 level. Conus and cauda equina appear normal. Paraspinal and other soft tissues: Mild right lower posterior paraspinal muscle edema. Disc levels: L1-L2:  No canal or foraminal stenosis. L2-L3:  Disc bulge.  No canal or foraminal stenosis. L3-L4: Disc bulge with prominent foraminal components. Punctate left foraminal annular fissure. Minor facet arthropathy. No canal stenosis. Partial effacement of right subarticular recess. Mild right and minor left foraminal stenosis. L4-L5: Disc bulge with superimposed left foraminal/far lateral protrusion ule. Mild facet arthropathy with ligamentum flavum infolding. No canal stenosis. Mild foraminal stenosis. L5-S1: Disc bulge with endplate osteophytic ridging. No canal stenosis. Mild right and mild to moderate left foraminal stenosis. IMPRESSION: Multilevel degenerative changes without high-grade stenosis. Electronically Signed   By: Praneil  Patel M.D.   On: 01/04/2022 14:51   ° ° ° ° ° °Scheduled Meds: ° acetaminophen (TYLENOL) oral liquid 160 mg/5 mL  1,000 mg Oral Q6H  ° alteplase  2 mg Intracatheter Once  °  chlorhexidine  15 mL Mouth Rinse BID  ° Chlorhexidine Gluconate Cloth  6 each Topical Daily  ° enoxaparin (LOVENOX) injection  40 mg Subcutaneous Q24H  ° feeding supplement  237 mL Oral TID BM  ° ferrous sulfate  325 mg Oral BID WC  ° folic acid  1 mg Oral Daily  ° insulin aspart  0-9 Units Subcutaneous 4 times per day  ° mouth rinse  15 mL Mouth Rinse q12n4p  ° mirtazapine  15 mg Oral QHS  ° multivitamin with minerals  1 tablet Oral Daily  ° niacin  100 mg Oral QHS  ° nutrition supplement (JUVEN)  1 packet Oral BID BM  °   pantoprazole  40 mg Oral QHS  ° psyllium  1 packet Oral BID  ° sodium chloride flush  10-40 mL Intracatheter Q12H  ° thiamine  100 mg Oral BID  ° °Continuous Infusions: ° sodium chloride Stopped (12/24/21 1137)  ° sodium chloride 100 mL/hr at 01/05/22 1302  ° piperacillin-tazobactam 3.375 g (01/05/22 1303)  ° TPN CYCLIC-ADULT (ION)    ° ° ° LOS: 49 days  ° ° °Time spent: over 30 min ° ° ° °Caldwell Powell, MD °[facility] ° ° °To contact the attending provider between 7A-7P or the covering provider during after hours 7P-7A, please log into the web site [email or web address] and access using universal [place] password for that web site. If you do not have the password, please call the hospital operator. ° °01/05/2022, 2:39 PM  ° ° °

## 2022-01-05 NOTE — Progress Notes (Signed)
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 Harlow Asa 11/27.  Per recent MD notes: "Encephalopathy/FTT - Patient has been on folic acid and thiamine supplements. Noted to have poor motor control and coordination the other day by PT which is different from last week per therapy notes, Neurology consulted - appreciated their input. No further workup needed at this time from their standpoint.    - psych saw 12/27 and determined patient does not have capacity. Patient has been worsening since admission.  She was confused from POD 1 on. Question if she was like this prior to admission, but lived alone and no one aware of these issues." Rapid response called 1/12/ for AMS   OT comments  Patient more confused today. She asked therapist if I had been arguing and reported seeing a baby while therapist in the room. She also exhibits more difficulty with attention and required more cues to stay on task and participate. Treatment focused on using left upper extremity with feeding/grooming. Still requires max assist. Neuro re-ed to both upper extremities working on activation of shoulder flexion and external rotation, elbow flexion/extension and functional grasp. Therapist able to passively range RLE but patient could not tolerate movement of LLE due to pain. Patient also found to be cold and shaking after drinking. Temperature WNL. Continue POC.   Recommendations for follow up therapy are one component of a multi-disciplinary discharge planning process, led by the attending physician.  Recommendations may be updated based on patient status, additional functional criteria and insurance authorization.    Follow Up Recommendations  Skilled nursing-short term rehab  (<3 hours/day)    Assistance Recommended at Discharge Frequent or constant Supervision/Assistance  Patient can return home with the following  A lot of help with bathing/dressing/bathroom;Two people to help with bathing/dressing/bathroom;Two people to help with walking and/or transfers;A lot of help with walking and/or transfers;Assistance with cooking/housework   Equipment Recommendations   (Defer to next venue)    Recommendations for Other Services      Precautions / Restrictions Precautions Precautions: Fall Precaution Comments: abdominal surgery, RUQ ileostomy Restrictions Weight Bearing Restrictions: No       Mobility Bed Mobility                    Transfers                         Balance                                           ADL either performed or assessed with clinical judgement   ADL   Eating/Feeding: Maximal assistance Eating/Feeding Details (indicate cue type and reason): improved ability to hold cup and bring mouth today with less assistance (but still initally min assist then to min guard). Grooming: Wash/dry face;Maximal assistance Grooming Details (indicate cue type and reason): max assist to wash face. Initially tried to just support at elbow to improve reaching face. Poor attention today.  Extremity/Trunk Assessment              Vision   Additional Comments: visual hallucinations today   Perception     Praxis      Cognition Arousal/Alertness: Awake/alert Behavior During Therapy: WFL for tasks assessed/performed Overall Cognitive Status: Impaired/Different from baseline                                 General Comments: Increased confusion today. Asked me if I had been arguing with someone. Also reports seeing a baby. harder to keep on task and to perform AROM.          Exercises Other Exercises Other Exercises: AAROM for elbow,  wrist and finger movement of each arm. Patient able to initiate some with cues. Predominantly PROM for shoulder ROm x 10 each arm. Other Exercises: PROM to hip and knee on RLE. Able to activate hip extension/knee extension to "push therapist away" but not flexion. Could not tolerate movement of LLE due to pain. Other Exercises: AAROM to shoulder x 15 each arm (shoulder flexion to 90 degrees, external rotation. Elbow flexion/extension x 10. required heavy multimodal cues to get patient to activate. Random resistance in each arm and with each muscle group but not consistent. When asked if she was resisting me patient states "no."   Shoulder Instructions       General Comments      Pertinent Vitals/ Pain       Pain Assessment: Faces Faces Pain Scale: Hurts whole lot Pain Location: Left foot/leg Pain Descriptors / Indicators: Grimacing;Moaning;Guarding Pain Intervention(s): Limited activity within patient's tolerance  Home Living                                          Prior Functioning/Environment              Frequency  Min 2X/week        Progress Toward Goals  OT Goals(current goals can now be found in the care plan section)  Progress towards OT goals: Progressing toward goals  Acute Rehab OT Goals Patient Stated Goal: get stronger OT Goal Formulation: With patient Time For Goal Achievement: 01/07/22 Potential to Achieve Goals: Poor  Plan Discharge plan remains appropriate    Co-evaluation          OT goals addressed during session: ADL's and self-care;Strengthening/ROM      AM-PAC OT "6 Clicks" Daily Activity     Outcome Measure   Help from another person eating meals?: A Lot Help from another person taking care of personal grooming?: A Lot Help from another person toileting, which includes using toliet, bedpan, or urinal?: Total Help from another person bathing (including washing, rinsing, drying)?: Total Help from another person to  put on and taking off regular upper body clothing?: Total Help from another person to put on and taking off regular lower body clothing?: Total 6 Click Score: 8    End of Session    OT Visit Diagnosis: Unsteadiness on feet (R26.81);Other symptoms and signs involving cognitive function;Pain;History of falling (Z91.81);Repeated falls (R29.6);Muscle weakness (generalized) (M62.81) Pain - Right/Left: Right Pain - part of body: Shoulder   Activity Tolerance Patient limited by pain (confusion)   Patient Left in bed;with call bell/phone within reach;with bed alarm set   Nurse Communication  (tremulous, cold)  Time: 1209-1240 OT Time Calculation (min): 31 min  Charges: OT General Charges $OT Visit: 1 Visit OT Treatments $Self Care/Home Management : 8-22 mins $Neuromuscular Re-education: 8-22 mins  Chrisa Hassan, OTR/L [place] [phone] Pager: No Name 01/05/2022, 1:00 PM

## 2022-01-05 NOTE — Progress Notes (Signed)
Patient transported to MRI 

## 2022-01-05 NOTE — Progress Notes (Signed)
Progress Note  49 Days Post-Op  Subjective: Events of overnight reviewed.  Pt resting comfortably in bed  Objective: Vital signs in last 24 hours: Temp:  [97.6 F (36.4 C)-99.8 F (37.7 C)] 97.6 F (36.4 C) (01/15 0434) Pulse Rate:  [88-100] 88 (01/15 0434) Resp:  [16-18] 16 (01/15 0434) BP: (118-133)/(72-83) 133/74 (01/15 0434) SpO2:  [97 %-100 %] 97 % (01/15 0434) Last BM Date: 01/04/22  Intake/Output from previous day: 01/14 0701 - 01/15 0700 In: 1958.4 [P.O.:160; I.V.:1645.5; IV Piggyback:152.9] Out: 1360 [Urine:1050; Stool:310] Intake/Output this shift: No intake/output data recorded.  PE: General: resting, NAD Resp: normal work of breathing Abdomen: soft, nondistended, nontender to palpation. Midline incision with purulent drainage noted,  Ileostomy productive of oatmeal consistency stool. 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 Extremities: warm and well-perfused, tight Neuro: FC, no focal deficits this AM but generalized weakness   Lab Results:  Recent Labs    01/03/22 0445 01/04/22 0540  WBC 9.5 8.2  HGB 8.1* 8.2*  HCT 26.8* 27.9*  PLT 300 298    BMET Recent Labs    01/03/22 0445 01/04/22 0540  NA 142 143  K 3.7 3.6  CL 108 112*  CO2 26 27  GLUCOSE 142* 166*  BUN 22 26*  CREATININE 0.48 0.40*  CALCIUM 10.3 9.9    PT/INR No results for input(s): LABPROT, INR in the last 72 hours. CMP     Component Value Date/Time   NA 143 01/04/2022 0540   K 3.6 01/04/2022 0540   CL 112 (H) 01/04/2022 0540   CO2 27 01/04/2022 0540   GLUCOSE 166 (H) 01/04/2022 0540   BUN 26 (H) 01/04/2022 0540   CREATININE 0.40 (L) 01/04/2022 0540   CALCIUM 9.9 01/04/2022 0540   CALCIUM 10.9 (H) 01/01/2022 0449   PROT 6.9 01/02/2022 2144   ALBUMIN 1.9 (L) 01/04/2022 1540   AST 17 01/02/2022 2144   ALT 17 01/02/2022 2144   ALKPHOS 83 01/02/2022 2144   BILITOT 0.3 01/02/2022 2144   GFRNONAA >60 01/04/2022 0540   Lipase     Component Value Date/Time    LIPASE 22 11/17/2021 0955       Studies/Results: MR THORACIC SPINE WO CONTRAST  Result Date: 01/04/2022 CLINICAL DATA:  Weakness, lower extremity pain.  Scoliosis EXAM: MRI THORACIC SPINE WITHOUT CONTRAST TECHNIQUE: Multiplanar, multisequence MR imaging of the thoracic spine was performed. No intravenous contrast was administered. COMPARISON:  None. FINDINGS: Alignment: Dextroscoliotic curvature of the midthoracic spine. No static listhesis within the sagittal plane. Vertebrae: No fracture, evidence of discitis, or bone lesion. Cord:  Normal signal and morphology. Paraspinal and other soft tissues: Negative. Disc levels: Intervertebral discs of the thoracic spine are within normal limits. No focal disc protrusion. Mild left-sided facet arthropathy at T10-11 and T11-12. No foraminal or canal stenosis at any level within the thoracic spine. There is prominence of the posterior epidural fat throughout the thoracic spine, although does not contribute to significant canal stenosis. IMPRESSION: 1. Dextroscoliotic curvature of the midthoracic spine. 2. Mild left-sided facet arthropathy at T10-11 and T11-12. No foraminal or canal stenosis at any level within the thoracic spine. 3. Prominence of the posterior epidural fat throughout the thoracic spine, although does not contribute to significant canal stenosis. Electronically Signed   By: Davina Poke D.O.   On: 01/04/2022 16:42   MR LUMBAR SPINE WO CONTRAST  Result Date: 01/04/2022 CLINICAL DATA:  Weakness lower extremity EXAM: MRI LUMBAR SPINE WITHOUT CONTRAST TECHNIQUE: Multiplanar, multisequence MR  imaging of the lumbar spine was performed. No intravenous contrast was administered. COMPARISON:  None. FINDINGS: Segmentation:  Standard. Alignment:  Anteroposterior alignment is maintained. Vertebrae: Mildly decreased T1 marrow signal, which may reflect hematopoietic marrow. Vertebral body heights are maintained. Conus medullaris and cauda equina: Conus  extends to the L1-L2 level. Conus and cauda equina appear normal. Paraspinal and other soft tissues: Mild right lower posterior paraspinal muscle edema. Disc levels: L1-L2:  No canal or foraminal stenosis. L2-L3:  Disc bulge.  No canal or foraminal stenosis. L3-L4: Disc bulge with prominent foraminal components. Punctate left foraminal annular fissure. Minor facet arthropathy. No canal stenosis. Partial effacement of right subarticular recess. Mild right and minor left foraminal stenosis. L4-L5: Disc bulge with superimposed left foraminal/far lateral protrusion ule. Mild facet arthropathy with ligamentum flavum infolding. No canal stenosis. Mild foraminal stenosis. L5-S1: Disc bulge with endplate osteophytic ridging. No canal stenosis. Mild right and mild to moderate left foraminal stenosis. IMPRESSION: Multilevel degenerative changes without high-grade stenosis. Electronically Signed   By: Macy Mis M.D.   On: 01/04/2022 14:51    Anti-infectives: Anti-infectives (From admission, onward)    Start     Dose/Rate Route Frequency Ordered Stop   01/01/22 2100  piperacillin-tazobactam (ZOSYN) IVPB 3.375 g        3.375 g 12.5 mL/hr over 240 Minutes Intravenous Every 8 hours 01/01/22 2042     11/25/21 1000  piperacillin-tazobactam (ZOSYN) IVPB 3.375 g  Status:  Discontinued        3.375 g 12.5 mL/hr over 240 Minutes Intravenous Every 8 hours 11/25/21 0909 12/01/21 0822   11/17/21 1830  piperacillin-tazobactam (ZOSYN) IVPB 3.375 g        3.375 g 12.5 mL/hr over 240 Minutes Intravenous Every 8 hours 11/17/21 1415 11/22/21 2359   11/17/21 1230  piperacillin-tazobactam (ZOSYN) IVPB 3.375 g        3.375 g 100 mL/hr over 30 Minutes Intravenous  Once 11/17/21 1220 11/17/21 1304        Assessment/Plan 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 Harlow Asa 11/27 - path: necrotic colon at site of perforation, no malignancy or  dysplasia noted  - Ileostomy output improved. Continue BID metamucil.  - WOC following for ileostomy, mucous fistula - Wound with blue-green, musty smelling drainage 1/11 - Dakin's TID x72 hrs. Wound probed and deeper tract opened after CT 1/11 with return of purulent drainage.   - upon DC of Dakin's would like TID dry dressing to help absorb drainage  - Pathology benign. CT distal descending/proximal sigmoid bowel luminal collapse. - CT 1/11 - some extravasated oral contrast in the subcutaneous abdominal wall superior and lateral to the ostomy at this level. There is inflammatory tissue at the level of the ostomy extending superiorly abutting the inferior tip of the liver and fundus of the gallbladder similar to the prior examination. There is likely some early fluid collection development anterior to the right kidney image 7/31 measuring 1.9 by 1.4 by 3.7 cm. Previously described fluid collections near the ostomy are not as well defined on the current study. 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 There is no free intraperitoneal air or ascites. Small air-fluid collection at the level of the umbilicus is again seen containing air. This collection measures 2.5 x 0.7 by 1.3 cm and has mildly increased in size. Air in the  collection is new from prior. - no collection around the ileostomy that is drainable - the best way to try to resolve this is abx and improving nutrition   FEN - Dysphagia 2 diet as tolerated (brother reports patient has difficulty chewing anything that is not soft or pureed).  Protein supplements per dietitian. G tube may be necessary to get adequate enteral nutrition - family currently refusing this. TPN at full rate, although patient is taking in more ensure.    VTE - SCDs, Lovenox ID - zosyn 11/27>12/2, 12/5>12/11, restarted 1/11>> Foley - None currently    -  encephalopathy/FTT - appreciate consultation at request of Dr. Johnnye Sima. Neurology saw a few weeks ago and signed off. On folic acid and thiamine supplementation. Family requesting further neurologic testing - appreciate Neuro seeing again. Appreciate TRH following as well. Vitamin B6, B1 and E levels pending, PTH normal, serum copper level pending.    - psych saw 12/27 and determined patient does not have capacity   - frequent urination - wick discontinued secondary to pressure wounds but per nursing patient is urinating multiple times per hr and has difficulty moving secondary to pain in legs. Recent urine culture with multiple species present - UA 1/10 unremarkable, Urine Cx with enterococcus faecalis sensitive to ampicillin, Zosyn should be adequate coverage   Dispo: Patient will need SNF placement at discharge. Appreciate case management sending documents to [facility] in [place].  We will continue to work on disposition with case management and family.    LOS: 49 days   I reviewed nursing notes, hospitalist notes, last 24 h vitals and pain scores, last 48 h intake and output, and last 24 h labs and trends.  This care required moderate level of medical decision making.    Rosario Adie, [facility] 01/05/2022, 8:05 AM Please see Amion for pager number during day hours 7:00am-4:30pm

## 2022-01-06 ENCOUNTER — Inpatient Hospital Stay (HOSPITAL_COMMUNITY)
Admit: 2022-01-06 | Discharge: 2022-01-06 | Disposition: A | Discharge status: Home / Self Care | Payer: Medicare HMO | Attending: Family Medicine | Admitting: Family Medicine

## 2022-01-06 LAB — COMPREHENSIVE METABOLIC PANEL
ALT: 22 U/L (ref 0–44)
AST: 24 U/L (ref 15–41)
Albumin: 1.7 g/dL — ABNORMAL LOW (ref 3.5–5.0)
Alkaline Phosphatase: 69 U/L (ref 38–126)
Anion gap: 3 — ABNORMAL LOW (ref 5–15)
BUN: 24 mg/dL — ABNORMAL HIGH (ref 8–23)
CO2: 24 mmol/L (ref 22–32)
Calcium: 9.1 mg/dL (ref 8.9–10.3)
Chloride: 114 mmol/L — ABNORMAL HIGH (ref 98–111)
Creatinine, Ser: <0.3 mg/dL — ABNORMAL LOW (ref 0.44–1.00)
Glucose, Bld: 135 mg/dL — ABNORMAL HIGH (ref 70–99)
Potassium: 3.9 mmol/L (ref 3.5–5.1)
Sodium: 141 mmol/L (ref 135–145)
Total Bilirubin: 0.3 mg/dL (ref 0.3–1.2)
Total Protein: 6.4 g/dL — ABNORMAL LOW (ref 6.5–8.1)

## 2022-01-06 LAB — GLUCOSE, CAPILLARY
Glucose-Capillary: 141 mg/dL — ABNORMAL HIGH (ref 70–99)
Glucose-Capillary: 155 mg/dL — ABNORMAL HIGH (ref 70–99)
Glucose-Capillary: 90 mg/dL (ref 70–99)
Glucose-Capillary: 98 mg/dL (ref 70–99)

## 2022-01-06 LAB — MAGNESIUM: Magnesium: 2.2 mg/dL (ref 1.7–2.4)

## 2022-01-06 LAB — CBC WITH DIFFERENTIAL/PLATELET
Abs Immature Granulocytes: 0.02 10*3/uL (ref 0.00–0.07)
Basophils Absolute: 0 10*3/uL (ref 0.0–0.1)
Basophils Relative: 0 %
Eosinophils Absolute: 0.5 10*3/uL (ref 0.0–0.5)
Eosinophils Relative: 7 %
HCT: 25.4 % — ABNORMAL LOW (ref 36.0–46.0)
Hemoglobin: 7.5 g/dL — ABNORMAL LOW (ref 12.0–15.0)
Immature Granulocytes: 0 %
Lymphocytes Relative: 13 %
Lymphs Abs: 1 10*3/uL (ref 0.7–4.0)
MCH: 27 pg (ref 26.0–34.0)
MCHC: 29.5 g/dL — ABNORMAL LOW (ref 30.0–36.0)
MCV: 91.4 fL (ref 80.0–100.0)
Monocytes Absolute: 0.6 10*3/uL (ref 0.1–1.0)
Monocytes Relative: 8 %
Neutro Abs: 5.5 10*3/uL (ref 1.7–7.7)
Neutrophils Relative %: 72 %
Platelets: 280 10*3/uL (ref 150–400)
RBC: 2.78 MIL/uL — ABNORMAL LOW (ref 3.87–5.11)
RDW: 16.8 % — ABNORMAL HIGH (ref 11.5–15.5)
WBC: 7.7 10*3/uL (ref 4.0–10.5)
nRBC: 0 % (ref 0.0–0.2)

## 2022-01-06 LAB — CALCITRIOL (1,25 DI-OH VIT D): Vit D, 1,25-Dihydroxy: 21.5 pg/mL — ABNORMAL LOW (ref 24.8–81.5)

## 2022-01-06 LAB — PHOSPHORUS: Phosphorus: 2.8 mg/dL (ref 2.5–4.6)

## 2022-01-06 LAB — TRIGLYCERIDES: Triglycerides: 76 mg/dL (ref <150)

## 2022-01-06 MED ORDER — TRAVASOL 10 % IV SOLN
INTRAVENOUS | Status: AC
  Filled 2022-01-06: qty 1020

## 2022-01-06 NOTE — Progress Notes (Signed)
Consult NOTE    Sherry Hampton  IFO:[id number] DOB: May 06, 1956 DOA: 11/17/2021 PCP: Pcp, No  No chief complaint on file.   Brief Narrative:  66 year old woman according to chart was independent, living alone and driving and continue to work as needed.  PMH including hyperlipidemia, possibly some memory loss, presented 11/27 with sudden abdominal pain.  Underwent emergent surgery for perforated viscus of the ascending colon with fecal peritonitis same day.  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  Hospitalization has been complicated by fluid collections in the abdomen, malnutrition, currently on TNA, intermittent encephalopathy and failure to thrive and progressive decline in strength and ability to participate with physical therapy.    See previous notes    Assessment & Plan:   Principal Problem:   Perforated abdominal viscus Active Problems:   Acute metabolic encephalopathy   Enterococcus UTI   Abnormal neurological exam   Hypercalcemia   Folate deficiency   Protein-calorie malnutrition, severe   Common bile duct dilatation   Memory loss   Delirium   GERD (gastroesophageal reflux disease)   Hyperlipidemia   Hyponatremia   Acute blood loss anemia   Necrosis of colon with perforation    Ileostomy in place [facility])   * Perforated abdominal viscus- (present on admission) --per surgery S/p ex lap, extended right colectomy, end ileostomy, mucous fistula 11/17/2021 CT abd pelvis 1/12 with extravasation of oral contrast into subcutaneous abdominal wall at level of ostomy concerning for bowel leak, inflammatory tissue in R abdomen at level of ostomy extending to the inferior tip of the liver, early fluid collection concerning for phlegmon or abscess, subcutaneous fluid colelction at level of umbilicus increased in size and with air concerning for abscess abx started (1/11 - present) with zosyn - of  note, she's on scheduled tylenol (receives this intermittently and continues to have elevated temperatures (99.8-100 occasionally over past few days).  Tylenol maybe suppressing any true fevers (difficult to tell as sometimes she doesn't receive this).  Abnormal imaging findings per surgery -> no collection around ileostomy that is drainable, recommending abx and improving nutrition  Enterococcus UTI On zosyn cx from 6/76/72  Acute metabolic encephalopathy- (present on admission) alert, but obviously confused - Sherry Hampton little better again, more able to converse, but still difficult to understand  See 12/28/2021 initial consult note Sherry Hampton) for review of the chart and extensive work-up. Previously with normal ammonia, TSH, HIV S/p high dose thiamine, continue supplemenation MRI brain 12/21 without acute abnormality Repeat MRI brain 1/15 without acute findings EEG pending MRI C spine with C6-7 severe bilateral neural foraminal narrowing, C5-6 severe L and mild R enural foraminal narrowing, C3-4 and C4-5 mild to moderate L neural foraminal narrowing Neurology evaluated and delirium thought 2/2 prolonged hospitalization, sleep wake disturbance, nutritional def, critical illness Continued encephalopathy multifactorial, related to infection (above - now on abx), prolonged hospitalization, nutritional def, critical illness --- follow with antibiotics Delirium precautions    Abnormal neurological exam Exam difficult, inconsistent - she's moving all extremities todayi, but doesn't consistently follow instructions or participate Seen by neurology 01/01/2021, apparently patient refused exam  MRI c spine recommended 12/21, MRI c spine with C6-7 severe bilateral neural foraminal narrowing, C5-6 severe L and mild R enural foraminal narrowing, C3-4 and C4-5 mild to moderate L neural foraminal narrowing MRI brain without acute abnormality MRI T and L spine done as well without findings that I think would  explain her weakness, will  review with neuro Pending copper, vitamin E, vitamin b1, and vitamin b6.  Vitamin D wnl. Continue folate and niacin supplementation per neuro Will get EEG EMG/NCS will need to be outpatient   Folate deficiency supplementation  Hypercalcemia Calcium 9.1, corrects to 10.9 Continue IVF and follow PTH 44 -> suggests non parathyroid hypercalcemia Consider additional w/u -> follow PTHrp (pending), vitamin D 1, 25 (slightly low)  Common bile duct dilatation Noted incidentally on CT scan Bili and alk phos wnl Will defer additional imaging to surgery  Protein-calorie malnutrition, severe Continue nutrition as per surgery  Memory loss According to the chart mild prior to admission, patient was independent and still working at times   DVT prophylaxis: lovenox Code Status: full Family Communication: none at bedside Disposition: per general surgery  General surgery is primary  Procedures:  Exploratory laparotomy, extended right colectomy, end ileostomy Sherry Hampton), and mucous fistula (distal transverse colon) 11/17/2021  Antimicrobials:  Anti-infectives (From admission, onward)    Start     Dose/Rate Route Frequency Ordered Stop   01/01/22 2100  piperacillin-tazobactam (ZOSYN) IVPB 3.375 g        3.375 g 12.5 mL/hr over 240 Minutes Intravenous Every 8 hours 01/01/22 2042     11/25/21 1000  piperacillin-tazobactam (ZOSYN) IVPB 3.375 g  Status:  Discontinued        3.375 g 12.5 mL/hr over 240 Minutes Intravenous Every 8 hours 11/25/21 0909 12/01/21 0822   11/17/21 1830  piperacillin-tazobactam (ZOSYN) IVPB 3.375 g        3.375 g 12.5 mL/hr over 240 Minutes Intravenous Every 8 hours 11/17/21 1415 11/22/21 2359   11/17/21 1230  piperacillin-tazobactam (ZOSYN) IVPB 3.375 g        3.375 g 100 mL/hr over 30 Minutes Intravenous  Once 11/17/21 1220 11/17/21 1304       Subjective: Answers more questions today, more of Sherry Hampton conversation  Objective: Vitals:    01/05/22 1334 01/05/22 2157 01/06/22 0551 01/06/22 1348  BP: 108/73 118/67 120/61 130/81  Pulse: 96 88 89 (!) 105  Resp: '18 18 16 18  ' Temp: 97.7 F (36.5 C) 97.6 F (36.4 C) 98.2 F (36.8 C) 99.2 F (37.3 C)  TempSrc: Oral Oral Oral Oral  SpO2: 100% 100% 99% 100%  Weight:      Height:        Intake/Output Summary (Last 24 hours) at 01/06/2022 1456 Last data filed at 01/06/2022 1400 Gross per 24 hour  Intake 1727.09 ml  Output 3475 ml  Net -1747.91 ml   Filed Weights   12/17/21 0500 12/24/21 0500 12/30/21 0635  Weight: 76.5 kg 77.9 kg 73.3 kg    Examination: General: No acute distress. Cardiovascular: RRR Lungs: unlabored Abdomen: ostomy with brown stool, midline dressing, mucous fistula Neurological: alert, confused - she's able to talk Chyrl Elwell little more today, more of Raymone Pembroke conversation that makes sense, but still difficult to understand at times and have to infer some things - RUE weaker than left, L foot drop Extremities: No clubbing or cyanosis. No edema  Data Reviewed: I have personally reviewed following labs and imaging studies  CBC: Recent Labs  Lab 01/02/22 2144 01/03/22 0445 01/04/22 0540 01/05/22 0800 01/06/22 0347  WBC 9.9 9.5 8.2 7.7 7.7  NEUTROABS 7.9* 7.7 6.0 5.5 5.5  HGB 8.1* 8.1* 8.2* 7.5* 7.5*  HCT 27.2* 26.8* 27.9* 25.4* 25.4*  MCV 89.8 88.7 90.9 92.0 91.4  PLT 306 300 298 266 542    Basic Metabolic Panel: Recent Labs  Lab 01/02/22  5784 01/02/22 2144 01/03/22 0445 01/04/22 0540 01/05/22 0800 01/06/22 0347  NA 141 142 142 143 144 141  K 3.5 3.5 3.7 3.6 3.7 3.9  CL 110 113* 108 112* 114* 114*  CO2 '26 25 26 27 27 24  ' GLUCOSE 150* 126* 142* 166* 150* 135*  BUN 25* 25* 22 26* 23 24*  CREATININE 0.56 0.50 0.48 0.40* 0.40* <0.30*  CALCIUM 10.3 10.3 10.3 9.9 9.4 9.1  MG 2.1  --  2.0 2.2 2.2 2.2  PHOS 3.1  --  3.0 2.6 2.4* 2.8    GFR: CrCl cannot be calculated (This lab value cannot be used to calculate CrCl because it is not Justin Buechner number:  <0.30).  Liver Function Tests: Recent Labs  Lab 01/02/22 0356 01/02/22 2144 01/03/22 0445 01/04/22 0532 01/04/22 1540 01/05/22 0800 01/06/22 0347  AST 21 17  --   --   --  19 24  ALT 21 17  --   --   --  18 22  ALKPHOS 91 83  --   --   --  74 69  BILITOT 0.4 0.3  --   --   --  0.3 0.3  PROT 7.4 6.9  --   --   --  6.7 6.4*  ALBUMIN 1.9* 1.7* 1.7* 1.8* 1.9* 1.7* 1.7*    CBG: Recent Labs  Lab 01/05/22 1343 01/05/22 1946 01/06/22 0142 01/06/22 0732 01/06/22 1339  GLUCAP 120* 146* 141* 155* 90     Recent Results (from the past 240 hour(s))  Urine Culture     Status: Abnormal   Collection Time: 12/31/21 10:48 AM   Specimen: In/Out Cath Urine  Result Value Ref Range Status   Specimen Description   Final    IN/OUT CATH URINE Performed at [facility], [place] [address] [place], [place] [address]    Special Requests   Final    NONE Performed at [facility], [place] [address] [place], [place] [address]    Culture >=100,000 COLONIES/mL ENTEROCOCCUS FAECALIS (Corderro Koloski)  Final   Report Status 01/02/2022 FINAL  Final   Organism ID, Bacteria ENTEROCOCCUS FAECALIS (Keneisha Heckart)  Final      Susceptibility   Enterococcus faecalis - MIC*    AMPICILLIN <=2 SENSITIVE Sensitive     NITROFURANTOIN <=16 SENSITIVE Sensitive     VANCOMYCIN 1 SENSITIVE Sensitive     * >=100,000 COLONIES/mL ENTEROCOCCUS FAECALIS         Radiology Studies: MR BRAIN WO CONTRAST  Result Date: 01/05/2022 CLINICAL DATA:  Neuro deficit, acute, stroke suspected EXAM: MRI HEAD WITHOUT CONTRAST TECHNIQUE: Multiplanar, multiecho pulse sequences of the brain and surrounding structures were obtained without intravenous contrast. COMPARISON:  12/11/2021 FINDINGS: Motion artifact is present and is significant on several sequences. Brain: There is no acute infarction or intracranial hemorrhage. There is no intracranial mass, mass effect, or edema. There is no hydrocephalus or extra-axial fluid  collection. Stable prominence of the ventricles and sulci reflecting parenchymal volume loss. Patchy and confluent areas of T2 hyperintensity in the supratentorial white matter are nonspecific but probably reflects stable chronic microvascular ischemic changes. Vascular: Major vessel flow voids at the skull base are preserved. Skull and upper cervical spine: Normal marrow signal is preserved. Sinuses/Orbits: Minor mucosal thickening.  Orbits are unremarkable. Other: Sella is unremarkable.  Mastoid air cells are clear. IMPRESSION: No evidence of recent infarction, hemorrhage, or mass. Chronic microvascular ischemic changes. No substantial change from recent prior study. Electronically Signed   By: Malachi Carl  Patel M.D.   On: 01/05/2022 11:13        Scheduled Meds:  acetaminophen (TYLENOL) oral liquid 160 mg/5 mL  1,000 mg Oral Q6H   alteplase  2 mg Intracatheter Once   chlorhexidine  15 mL Mouth Rinse BID   Chlorhexidine Gluconate Cloth  6 each Topical Daily   enoxaparin (LOVENOX) injection  40 mg Subcutaneous Q24H   feeding supplement  237 mL Oral TID BM   ferrous sulfate  325 mg Oral BID WC   folic acid  1 mg Oral Daily   insulin aspart  0-9 Units Subcutaneous 4 times per day   mouth rinse  15 mL Mouth Rinse q12n4p   mirtazapine  15 mg Oral QHS   multivitamin with minerals  1 tablet Oral Daily   niacin  100 mg Oral QHS   nutrition supplement (JUVEN)  1 packet Oral BID BM   pantoprazole  40 mg Oral QHS   psyllium  1 packet Oral BID   sodium chloride flush  10-40 mL Intracatheter Q12H   thiamine  100 mg Oral BID   Continuous Infusions:  sodium chloride Stopped (12/24/21 1137)   sodium chloride 75 mL/hr at 01/06/22 1155   piperacillin-tazobactam 3.375 g (53/79/43 2761)   TPN CYCLIC-ADULT (ION)       LOS: 50 days    Time spent: over 30 min    Fayrene Helper, MD [facility]   To contact the attending provider between 7A-7P or the covering provider during after hours  7P-7A, please log into the web site [email or web address] and access using universal [place] password for that web site. If you do not have the password, please call the hospital operator.  01/06/2022, 2:56 PM

## 2022-01-06 NOTE — Plan of Care (Signed)
  Problem: Coping: Goal: Level of anxiety will decrease Outcome: Progressing   Problem: Pain Managment: Goal: General experience of comfort will improve Outcome: Progressing   Problem: Safety: Goal: Ability to remain free from injury will improve Outcome: Progressing   

## 2022-01-06 NOTE — Progress Notes (Signed)
EEG complete - results pending 

## 2022-01-06 NOTE — Progress Notes (Signed)
Progress Note  50 Days Post-Op  Subjective: Pt resting comfortably in bed  Objective: Vital signs in last 24 hours: Temp:  [97.6 F (36.4 C)-98.8 F (37.1 C)] 98.2 F (36.8 C) (01/16 0551) Pulse Rate:  [88-96] 89 (01/16 0551) Resp:  [16-18] 16 (01/16 0551) BP: (108-120)/(61-73) 120/61 (01/16 0551) SpO2:  [99 %-100 %] 99 % (01/16 0551) Last BM Date: 01/06/22  Intake/Output from previous day: 01/15 0701 - 01/16 0700 In: 1978 [P.O.:240; I.V.:1652.9; IV Piggyback:85.1] Out: 3950 [Urine:2550; OHYWV:3710] Intake/Output this shift: Total I/O In: -  Out: 350 [Urine:225; Stool:125]  PE: General: resting, NAD Resp: normal work of breathing Abdomen: soft, nondistended, nontender to palpation. Midline incision with less drainage.  Moderate "wad" of fibrin removed from base of wound at inferior aspect. Ileostomy productive. 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 Neuro: FC, no focal deficits this AM but generalized weakness   Lab Results:  Recent Labs    01/05/22 0800 01/06/22 0347  WBC 7.7 7.7  HGB 7.5* 7.5*  HCT 25.4* 25.4*  PLT 266 280   BMET Recent Labs    01/05/22 0800 01/06/22 0347  NA 144 141  K 3.7 3.9  CL 114* 114*  CO2 27 24  GLUCOSE 150* 135*  BUN 23 24*  CREATININE 0.40* <0.30*  CALCIUM 9.4 9.1   PT/INR No results for input(s): LABPROT, INR in the last 72 hours. CMP     Component Value Date/Time   NA 141 01/06/2022 0347   K 3.9 01/06/2022 0347   CL 114 (H) 01/06/2022 0347   CO2 24 01/06/2022 0347   GLUCOSE 135 (H) 01/06/2022 0347   BUN 24 (H) 01/06/2022 0347   CREATININE <0.30 (L) 01/06/2022 0347   CALCIUM 9.1 01/06/2022 0347   CALCIUM 10.9 (H) 01/01/2022 0449   PROT 6.4 (L) 01/06/2022 0347   ALBUMIN 1.7 (L) 01/06/2022 0347   AST 24 01/06/2022 0347   ALT 22 01/06/2022 0347   ALKPHOS 69 01/06/2022 0347   BILITOT 0.3 01/06/2022 0347   GFRNONAA NOT CALCULATED 01/06/2022 0347   Lipase     Component Value Date/Time   LIPASE 22  11/17/2021 0955       Studies/Results: MR BRAIN WO CONTRAST  Result Date: 01/05/2022 CLINICAL DATA:  Neuro deficit, acute, stroke suspected EXAM: MRI HEAD WITHOUT CONTRAST TECHNIQUE: Multiplanar, multiecho pulse sequences of the brain and surrounding structures were obtained without intravenous contrast. COMPARISON:  12/11/2021 FINDINGS: Motion artifact is present and is significant on several sequences. Brain: There is no acute infarction or intracranial hemorrhage. There is no intracranial mass, mass effect, or edema. There is no hydrocephalus or extra-axial fluid collection. Stable prominence of the ventricles and sulci reflecting parenchymal volume loss. Patchy and confluent areas of T2 hyperintensity in the supratentorial white matter are nonspecific but probably reflects stable chronic microvascular ischemic changes. Vascular: Major vessel flow voids at the skull base are preserved. Skull and upper cervical spine: Normal marrow signal is preserved. Sinuses/Orbits: Minor mucosal thickening.  Orbits are unremarkable. Other: Sella is unremarkable.  Mastoid air cells are clear. IMPRESSION: No evidence of recent infarction, hemorrhage, or mass. Chronic microvascular ischemic changes. No substantial change from recent prior study. Electronically Signed   By: Macy Mis M.D.   On: 01/05/2022 11:13   MR THORACIC SPINE WO CONTRAST  Result Date: 01/04/2022 CLINICAL DATA:  Weakness, lower extremity pain.  Scoliosis EXAM: MRI THORACIC SPINE WITHOUT CONTRAST TECHNIQUE: Multiplanar, multisequence MR imaging of the thoracic spine was performed. No intravenous  contrast was administered. COMPARISON:  None. FINDINGS: Alignment: Dextroscoliotic curvature of the midthoracic spine. No static listhesis within the sagittal plane. Vertebrae: No fracture, evidence of discitis, or bone lesion. Cord:  Normal signal and morphology. Paraspinal and other soft tissues: Negative. Disc levels: Intervertebral discs of the  thoracic spine are within normal limits. No focal disc protrusion. Mild left-sided facet arthropathy at T10-11 and T11-12. No foraminal or canal stenosis at any level within the thoracic spine. There is prominence of the posterior epidural fat throughout the thoracic spine, although does not contribute to significant canal stenosis. IMPRESSION: 1. Dextroscoliotic curvature of the midthoracic spine. 2. Mild left-sided facet arthropathy at T10-11 and T11-12. No foraminal or canal stenosis at any level within the thoracic spine. 3. Prominence of the posterior epidural fat throughout the thoracic spine, although does not contribute to significant canal stenosis. Electronically Signed   By: Davina Poke D.O.   On: 01/04/2022 16:42   MR LUMBAR SPINE WO CONTRAST  Result Date: 01/04/2022 CLINICAL DATA:  Weakness lower extremity EXAM: MRI LUMBAR SPINE WITHOUT CONTRAST TECHNIQUE: Multiplanar, multisequence MR imaging of the lumbar spine was performed. No intravenous contrast was administered. COMPARISON:  None. FINDINGS: Segmentation:  Standard. Alignment:  Anteroposterior alignment is maintained. Vertebrae: Mildly decreased T1 marrow signal, which may reflect hematopoietic marrow. Vertebral body heights are maintained. Conus medullaris and cauda equina: Conus extends to the L1-L2 level. Conus and cauda equina appear normal. Paraspinal and other soft tissues: Mild right lower posterior paraspinal muscle edema. Disc levels: L1-L2:  No canal or foraminal stenosis. L2-L3:  Disc bulge.  No canal or foraminal stenosis. L3-L4: Disc bulge with prominent foraminal components. Punctate left foraminal annular fissure. Minor facet arthropathy. No canal stenosis. Partial effacement of right subarticular recess. Mild right and minor left foraminal stenosis. L4-L5: Disc bulge with superimposed left foraminal/far lateral protrusion ule. Mild facet arthropathy with ligamentum flavum infolding. No canal stenosis. Mild foraminal  stenosis. L5-S1: Disc bulge with endplate osteophytic ridging. No canal stenosis. Mild right and mild to moderate left foraminal stenosis. IMPRESSION: Multilevel degenerative changes without high-grade stenosis. Electronically Signed   By: Macy Mis M.D.   On: 01/04/2022 14:51    Anti-infectives: Anti-infectives (From admission, onward)    Start     Dose/Rate Route Frequency Ordered Stop   01/01/22 2100  piperacillin-tazobactam (ZOSYN) IVPB 3.375 g        3.375 g 12.5 mL/hr over 240 Minutes Intravenous Every 8 hours 01/01/22 2042     11/25/21 1000  piperacillin-tazobactam (ZOSYN) IVPB 3.375 g  Status:  Discontinued        3.375 g 12.5 mL/hr over 240 Minutes Intravenous Every 8 hours 11/25/21 0909 12/01/21 0822   11/17/21 1830  piperacillin-tazobactam (ZOSYN) IVPB 3.375 g        3.375 g 12.5 mL/hr over 240 Minutes Intravenous Every 8 hours 11/17/21 1415 11/22/21 2359   11/17/21 1230  piperacillin-tazobactam (ZOSYN) IVPB 3.375 g        3.375 g 100 mL/hr over 30 Minutes Intravenous  Once 11/17/21 1220 11/17/21 1304        Assessment/Plan 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 Harlow Asa 11/27 - path: necrotic colon at site of perforation, no malignancy or dysplasia noted  - Ileostomy output improved. Continue BID metamucil.  - WOC following for ileostomy, mucous fistula - Wound with blue-green, musty smelling drainage 1/11 - Dakin's TID x72 hrs. Wound probed and deeper tract  opened after CT 1/11 with return of purulent drainage.   - upon DC of Dakin's would like TID dry dressing to help absorb drainage  - Pathology benign. CT distal descending/proximal sigmoid bowel luminal collapse. - CT 1/11 - some extravasated oral contrast in the subcutaneous abdominal wall superior and lateral to the ostomy at this level. There is inflammatory tissue at the level of the ostomy extending superiorly abutting the  inferior tip of the liver and fundus of the gallbladder similar to the prior examination. There is likely some early fluid collection development anterior to the right kidney image 7/31 measuring 1.9 by 1.4 by 3.7 cm. Previously described fluid collections near the ostomy are not as well defined on the current study. 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 There is no free intraperitoneal air or ascites. Small air-fluid collection at the level of the umbilicus is again seen containing air. This collection measures 2.5 x 0.7 by 1.3 cm and has mildly increased in size. Air in the collection is new from prior. - no collection around the ileostomy that is drainable - the best way to try to resolve this is abx and improving nutrition -ETHICs consult requested today to help with this situation   FEN - Dysphagia 2 diet as tolerated (brother reports patient has difficulty chewing anything that is not soft or pureed).  Protein supplements per dietitian. G tube may be necessary to get adequate enteral nutrition - family currently refusing this. TPN at full rate, as this has been weaned in the past but did not increase her intake.   VTE - SCDs, Lovenox ID - zosyn 11/27>12/2, 12/5>12/11, restarted 1/11>> Foley - None currently    - encephalopathy/FTT - appreciate consultation at request of Dr. Johnnye Sima. Neurology saw a few weeks ago and signed off. On folic acid and thiamine supplementation. Family requesting further neurologic testing - appreciate Neuro seeing again. Appreciate TRH following as well. Vitamin B6, B1 and E levels pending, PTH normal, serum copper level pending.    - psych saw 12/27 and determined patient does not have capacity   - frequent urination - wick discontinued secondary to pressure wounds but per nursing patient is urinating multiple times per hr and has difficulty moving secondary to  pain in legs. Recent urine culture with multiple species present - UA 1/10 unremarkable, Urine Cx with enterococcus faecalis sensitive to ampicillin, Zosyn should be adequate coverage   Dispo: Patient will need SNF placement at discharge. Appreciate case management sending documents to [facility] in [place].  We will continue to work on disposition with case management and family.    LOS: 50 days   I reviewed nursing notes, hospitalist notes, last 24 h vitals and pain scores, last 48 h intake and output, and last 24 h labs and trends.    Henreitta Cea, PA-C [facility] 01/06/2022, 11:00 AM Please see Amion for pager number during day hours 7:00am-4:30pm

## 2022-01-06 NOTE — Consult Note (Addendum)
[place] Nurse ostomy follow up Patient receiving care in [place]. Stoma type/location: RUQ ileostomy; LUQ mucous fistula Stomal assessment/size: deferred, pouches did not need changing; supplies had to be ordered and placed in the room. Peristomal assessment: deferred Treatment options for stomal/peristomal skin: barrier ring Output: thin brown in ileostomy pouch; no output in mucous fistula pouch. Ostomy pouching: 1pc. Kellie Simmering [phone] for mucous fistula; 2pc. 2 and 1/4 inches Lawson #[phone] for ileostomy Education provided: none Enrolled patient in [place] Discharge program: Yes, previously.  Six of each ostomy care item to be requested by the [place] for placement in the patient's room.  Val Riles, RN, MSN, CWOCN, CNS-BC, pager [phone]

## 2022-01-06 NOTE — Progress Notes (Signed)
PHARMACY - TOTAL PARENTERAL NUTRITION CONSULT NOTE   Indication:  Inability to meet enteral needs  Patient Measurements: Height: 5\' 10"  (177.8 cm) Weight: 73.3 kg (161 lb 9.6 oz) IBW/kg (Calculated) : 68.5 TPN AdjBW (KG): 79.8 Body mass index is 23.19 kg/m. Usual Weight: 80 kg  Assessment: 66 y.o. 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 She is now extended right colectomy and ileostomy. She developed acute metabolic encephalopathy post-op. She was initiated on TF, but tube became clogged on 12/8 and patient refused replacement, and is not eating enough to maintain nutrition.  Calorie count was extended to end 1/5 PM as patient did not consume 3 full meals on 1/4.  CCS ordered TPN to start on 12/12, pharmacy to manage.  Family does not wish to proceed with G-tube placement.  CCS made request on 01/02/22 to titrate TPN from half rate to full rate since pt has poor oral intake and not doing tube feed.  Glucose / Insulin: No hx DM.   - CBGs:  102-155, controlled while on full rate cyclic TPN, no hypoglycemia noted while cyclic TPN off - 4 unit of insulin used in past 24 hours  Electrolytes:  Elytes WNL, CorrCa elevated at 10.9 (no Ca in TPN) - Cl elevated (despite max Acetate) Renal: SCr <1 stable, BUN 23 Hepatic: LFTs WNL, Albumin low Trig: WNL (1/16) Intake / Output: net I/O +600 mL - UOP: 2550 mL - stool:  output  1400 ml MIVF: NS 100 ml/hr (continue IVF per Dr Florene Glen on 1/15 for hypercalcemia) GI Imaging:  - 12/27 abd CT: Postsurgical changes with a right lower quadrant ileostomy and left colostomy. No bowel obstruction. 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 Interval decrease in the size of the fluid density inferior to  the gallbladder. No drainable fluid collection Identified. Complex collection in the deep subcutaneous soft tissues of theanterior abdominal wall along the midline extending to the skin at the level of the umbilicus - 1/1 abd CT: There is again noted wall thickening and inflammatory changes involving small bowel loops in the right lower quadrant near the ileostomy suggesting enteritis with surrounding inflammation - 1/11 abd CT: Inflammatory tissue in the right abdomen at the level of the ostomy extending to the inferior tip the liver has mildly increased. There is new likely early fluid collection development anterior to the right kidney worrisome for phlegmon or abscess. Subcutaneous fluid collection at the level of the umbilicus has increased in size and now contains air worrisome for abscess.  GI Surgeries / Procedures:  11/27: Expiratory laparotomy, extended right colectomy, end ileostomy  Central access: PICC placed 12/11 TPN start date: 12/12  Nutritional Goals: Cyclic TPN at full rate with volume of 2040 mL will provide 102 gm protein and 1998 Kcal  RD Assessment: Estimated Needs Total Energy Estimated Needs: 1950-2150 Total Protein Estimated Needs: 95-105g Total Fluid Estimated Needs: 2L/day  Current Nutrition:  Regular diet- Minimal intake, patient noted to be pocketing food and will spit out - Ensure Enlive TID (20 g of protein and 350 kcal)  - Juven supplement packet (2.5g of protein and 95 kcal)  - cyclic TPN   Plan:  Continue full rate cyclic TPN: 1610 mL over 14 hrs.   Electrolytes in TPN. Na 59mEq/L K 70 mEq/L Ca 0 mEq/L Mg 7 mEq/L Phos 27  mmol/L Cl:Ac max Ac Continue multivitamin PO; Thiamine PO BID per neurology Sensitive SSI 4x a day based cbgs drawn: 2 hrs past cyclic TPN start + 1 hr past cycle TPN d/ced + middle of TPN infusion + while off TPN (8p, 2a, 9a, noon) BMP, magnesium, phosphorus with AM labs   Monitor TPN labs on Mon/Thurs    Royetta Asal,  PharmD, BCPS 01/06/2022 9:51 AM

## 2022-01-06 NOTE — Procedures (Signed)
[facility] Tele[facility] Consult Services  Routine EEG Report  Duration: 22 min  Patient Name:   Sherry Hampton, Sherry Hampton Date of Birth:   Dec 30, 1955 Identification Number:   MRN - [id number]  Date of Study:   01/06/2022 15:49:32  Indication: Encephalopathy,  Technical Summary: A routine 20 channel electroencephalogram using the international 10-20 system of electrode placement was performed.  Background: 5-6 Hz, Poorly formed  States       Awake  Abnormalities  Generalized Slowing: Diffuse generalized slowing Background Slowing: The background consists of 20-50 uV, 5-6 Hz diffuse activity with superimposed diffuse polymorphic delta activity that is non reactive to external stimulation.   Activation Procedures  Hyperventilation: Not performed  Photic Stimulation:  Classification: Abnormal :  Diagnosis: This is abnormal EEG, the Presence of Generalized slowing is consistent with Encephalopathy      Dr Tsosie Billing   [facility] [phone]  Case [id number]

## 2022-01-06 NOTE — Progress Notes (Signed)
Tried to call brother, Milbert Coulter, for update and further dispo planning.  Went to voicemail and VM left.  Will try to call again later today or tomorrow pending time availability.  Henreitta Cea 3:48 PM 01/06/2022

## 2022-01-07 DIAGNOSIS — Z7189 Other specified counseling: Secondary | ICD-10-CM

## 2022-01-07 DIAGNOSIS — E531 Pyridoxine deficiency: Secondary | ICD-10-CM

## 2022-01-07 LAB — BASIC METABOLIC PANEL
Anion gap: 3 — ABNORMAL LOW (ref 5–15)
BUN: 19 mg/dL (ref 8–23)
CO2: 24 mmol/L (ref 22–32)
Calcium: 9.4 mg/dL (ref 8.9–10.3)
Chloride: 110 mmol/L (ref 98–111)
Creatinine, Ser: 0.32 mg/dL — ABNORMAL LOW (ref 0.44–1.00)
GFR, Estimated: >60 mL/min (ref >60)
Glucose, Bld: 155 mg/dL — ABNORMAL HIGH (ref 70–99)
Potassium: 4 mmol/L (ref 3.5–5.1)
Sodium: 137 mmol/L (ref 135–145)

## 2022-01-07 LAB — VITAMIN B1: Vitamin B1 (Thiamine): 229.6 nmol/L — ABNORMAL HIGH (ref 66.5–200.0)

## 2022-01-07 LAB — PHOSPHORUS: Phosphorus: 3.1 mg/dL (ref 2.5–4.6)

## 2022-01-07 LAB — ALBUMIN: Albumin: 1.6 g/dL — ABNORMAL LOW (ref 3.5–5.0)

## 2022-01-07 LAB — MAGNESIUM: Magnesium: 1.8 mg/dL (ref 1.7–2.4)

## 2022-01-07 LAB — VITAMIN B6: Vitamin B6: 2.9 ug/L — ABNORMAL LOW (ref 3.4–65.2)

## 2022-01-07 LAB — GLUCOSE, CAPILLARY
Glucose-Capillary: 123 mg/dL — ABNORMAL HIGH (ref 70–99)
Glucose-Capillary: 166 mg/dL — ABNORMAL HIGH (ref 70–99)
Glucose-Capillary: 93 mg/dL (ref 70–99)
Glucose-Capillary: 95 mg/dL (ref 70–99)

## 2022-01-07 MED ORDER — VITAMIN B-6 100 MG PO TABS
100.0000 mg | ORAL_TABLET | Freq: Every day | ORAL | Status: DC
  Administered 2022-01-09 – 2022-01-20 (×11): 100 mg via ORAL
  Filled 2022-01-07 (×12): qty 1

## 2022-01-07 MED ORDER — TRAVASOL 10 % IV SOLN
INTRAVENOUS | Status: DC
  Filled 2022-01-07: qty 1020

## 2022-01-07 MED ORDER — MAGNESIUM SULFATE 2 GM/50ML IV SOLN
2.0000 g | Freq: Once | INTRAVENOUS | Status: AC
  Administered 2022-01-07: 2 g via INTRAVENOUS
  Filled 2022-01-07: qty 50

## 2022-01-07 NOTE — Assessment & Plan Note (Deleted)
Long discussion with [name], I spoke to her Sunday bc she had questions about the repeat MRI we did.  We had long conversation Sunday about rationale for repeating her imaging and updates including CT abd/pelvis and abx.  She asked me to call back today.  Followed up 1/17, she notes she's talked to [place] and that she thought she sounded maybe Makana Rostad little better.  She still endorses many concerns about her clinical course and her progress.  She did seem happy that she was on antibiotics.  I discussed globally her situation, concern with nutrition, encephalopathy, weakness, infection - deferred any surgical questions and questions of disposition to surgery as they're primary as well as questions regarding earlier portion of her hospitalization.  Discussed importance of nutrition in her recovery and the limitations/risk of TPN (as well as limitations with disposition with TPN) and our recommendation to consider G tube as we continue to treat her going forward.  She listened, I encouraged her to discuss with Milbert Coulter.  She notes she'll be in town on Friday.  Sounds like she wants to discuss with Arnell and family.  I asked her to let Milbert Coulter know that surgery had reached out to him.  Sounds like the families ultimate goal is to get her to [place].

## 2022-01-07 NOTE — TOC Progression Note (Addendum)
Transition of Care [facility]) - Progression Note    Patient Details  Name: Sherry Hampton MRN: [id number] Date of Birth: November 23, 1956  [facility] CM/SW Contact  Lennart Pall, LCSW Phone Number: 01/07/2022, 1:13 PM  Clinical Narrative:    I have received two text messages from pt's brother Sherry Hampton:  On 1/15: "I was informed that my sister Sherry Hampton was taken in today for an MRI. Did she fall then why"  1/17: "Good morning, as of today, my sister Sherry Hampton has been in the care of [facility] in [facility] for the past 51 days she continues to run fevers and infections. Why is this happening and what are you doing to correct the problem?"  I have explained to Mr. Sherry Hampton both times (via text response) that I am the social worker assigned, however, these are questions for the physician.  Per discussion with Sherry Danker, PA, she has attempted to reach him x 2 and there is no answer and she has left instructions of how he can reach her.  I have sent Mr. Sherry Hampton another text message and left VM stating it is imperative that he take the calls from the physician in order to progress with medical decision making. Case reviewed with [facility] supervisor.     Expected Discharge Plan: [place] Barriers to Discharge: Continued Medical Work up, Ship broker, SNF Pending bed offer  Expected Discharge Plan and Services Expected Discharge Plan: [place] In-house Referral: Clinical Social Work     Living arrangements for the past 2 months: Single Family Home                                       Social Determinants of Health (SDOH) Interventions    Readmission Risk Interventions No flowsheet data found.

## 2022-01-07 NOTE — Progress Notes (Signed)
Consult NOTE    Sherry Hampton  ESP:[id number] DOB: 09/22/1956 DOA: 11/17/2021 PCP: Pcp, No  No chief complaint on file.   Brief Narrative:  66 year old woman according to chart was independent, living alone and driving and continue to work as needed.  PMH including hyperlipidemia, possibly some memory loss, presented 11/27 with sudden abdominal pain.  Underwent emergent surgery for perforated viscus of the ascending colon with fecal peritonitis same day.  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  Hospitalization has been complicated by fluid collections in the abdomen, malnutrition, currently on TNA, intermittent encephalopathy and failure to thrive and progressive decline in strength and ability to participate with physical therapy.    See initial consult note from Dr. [name] on 1/7 for additional details.  Currently on antibiotics for enterococcus UTI and intraabdominal and subcutaneous fluid collections.  She remains deconditioned with abnormal neuro exam and encephalopathy.  Currently discussions ongoing regarding peg.    Assessment & Plan:   Principal Problem:   Perforated abdominal viscus Active Problems:   Goals of care, counseling/discussion   Acute metabolic encephalopathy   Enterococcus UTI   Abnormal neurological exam   Hypercalcemia   Folate deficiency   Vitamin B6 deficiency   Protein-calorie malnutrition, severe   Common bile duct dilatation   Memory loss   Delirium   GERD (gastroesophageal reflux disease)   Hyperlipidemia   Hyponatremia   Acute blood loss anemia   Necrosis of colon with perforation    Ileostomy in place [facility])   Goals of care, counseling/discussion Long discussion with [name] (sister), I spoke to her Sunday bc she had questions about the repeat MRI we did.  We had long conversation Sunday about rationale for repeating her imaging and updates including CT  abd/pelvis and abx.  She asked me to call back today.  Followed up 1/17, she notes she's talked to [place] and that she thought she sounded maybe [name] little better.  She still endorses many concerns about her clinical course and her progress.  She did seem happy that she was on antibiotics.  I discussed globally her situation, concern with nutrition, encephalopathy, weakness, infection - deferred any surgical questions and questions of disposition to surgery as they're primary as well as questions regarding earlier portion of her hospitalization.  Discussed importance of nutrition in her recovery and the limitations/risk of TPN (as well as limitations with disposition with TPN) and our recommendation to consider G tube as we continue to treat her going forward.  She listened, I encouraged her to discuss with Milbert Coulter.  She notes she'll be in town on Friday.  Sounds like she wants to discuss with [name] and family.  I asked her to let Milbert Coulter know that surgery had reached out to him.  Sounds like the families ultimate goal is to get her to [place].   * Perforated abdominal viscus- (present on admission) --per surgery S/p ex lap, extended right colectomy, end ileostomy, mucous fistula 11/17/2021 CT abd pelvis 1/12 with extravasation of oral contrast into subcutaneous abdominal wall at level of ostomy concerning for bowel leak, inflammatory tissue in R abdomen at level of ostomy extending to the inferior tip of the liver, early fluid collection concerning for phlegmon or abscess, subcutaneous fluid colelction at level of umbilicus increased in size and with air concerning for abscess abx started (1/11 - present) with zosyn - of note, she's on scheduled tylenol (receives this intermittently and continues to  have elevated temperatures (99.8-100 occasionally over past few days).  Tylenol maybe suppressing any true fevers (difficult to tell as sometimes she doesn't receive this).  Fever curve improving. Will defer  duration and repeat imaging to surgery Abnormal imaging findings per surgery -> no collection around ileostomy that is drainable, recommending abx and improving nutrition  Enterococcus UTI On zosyn cx from 12/31/21 At this time, UTI has been treated  Acute metabolic encephalopathy- (present on admission) alert, but obviously confused - Sherry Hampton little better again, more able to converse, but still difficult to understand  See 12/28/2021 initial consult note [name]) for review of the chart and extensive work-up. Previously with normal ammonia, TSH, HIV S/p high dose thiamine, continue supplemenation MRI brain 12/21 without acute abnormality Repeat MRI brain 1/15 without acute findings EEG generalized slowing c/w encephalopathy MRI C spine with C6-7 severe bilateral neural foraminal narrowing, C5-6 severe L and mild R neural foraminal narrowing, C3-4 and C4-5 mild to moderate L neural foraminal narrowing Neurology evaluated and delirium thought 2/2 prolonged hospitalization, sleep wake disturbance, nutritional def, critical illness Continued encephalopathy multifactorial, related to infection (above - now on abx), prolonged hospitalization, nutritional def, critical illness --- follow with antibiotics, nutrition, etc Delirium precautions    Abnormal neurological exam Exam difficult, inconsistent - she doesn't consistently follow instructions or participate, generally has seemed to have L foot drop, bilateral LE weakness and R>L upper extremity weakness. Seen by neurology 01/01/2021, apparently patient refused exam  MRI c spine recommended 12/21, MRI c spine with C6-7 severe bilateral neural foraminal narrowing, C5-6 severe L and mild R enural foraminal narrowing, C3-4 and C4-5 mild to moderate L neural foraminal narrowing MRI brain without acute abnormality MRI T and L spine done as well without findings that I think would explain her weakness, reviewed with neuro Copper wnl, vitamin E (pending),  vitamin b1 (elevated), and vitamin b6 (low).  Vitamin D wnl. Continue folate and niacin supplementation per neuro Supplement B6 EEG with generalized slowing EMG/NCS will need to be outpatient Discuss with neuro again as needed   Vitamin B6 deficiency replace  Folate deficiency supplementation  Hypercalcemia Calcium 9.4, corrects to 11.3 Continue IVF and follow PTH 44 -> suggests non parathyroid hypercalcemia Consider additional w/u -> follow PTHrp (pending), vitamin D 1, 25 (slightly low)  Common bile duct dilatation Noted incidentally on CT scan Bili and alk phos wnl Will defer additional imaging to surgery  Protein-calorie malnutrition, severe Continue nutrition as per surgery  Memory loss According to the chart mild prior to admission, patient was independent and still working at times   DVT prophylaxis: lovenox Code Status: full Family Communication: none at bedside Disposition: per general surgery  General surgery is primary  Procedures:  Exploratory laparotomy, extended right colectomy, end ileostomy [name]), and mucous fistula (distal transverse colon) 11/17/2021  Antimicrobials:  Anti-infectives (From admission, onward)    Start     Dose/Rate Route Frequency Ordered Stop   01/01/22 2100  piperacillin-tazobactam (ZOSYN) IVPB 3.375 g        3.375 g 12.5 mL/hr over 240 Minutes Intravenous Every 8 hours 01/01/22 2042     11/25/21 1000  piperacillin-tazobactam (ZOSYN) IVPB 3.375 g  Status:  Discontinued        3.375 g 12.5 mL/hr over 240 Minutes Intravenous Every 8 hours 11/25/21 0909 12/01/21 0822   11/17/21 1830  piperacillin-tazobactam (ZOSYN) IVPB 3.375 g        3.375 g 12.5 mL/hr over 240 Minutes Intravenous Every 8 hours 11/17/21  1415 11/22/21 2359   11/17/21 1230  piperacillin-tazobactam (ZOSYN) IVPB 3.375 g        3.375 g 100 mL/hr over 30 Minutes Intravenous  Once 11/17/21 1220 11/17/21 1304       Subjective: No  complaints  Objective: Vitals:   01/06/22 2048 01/07/22 0442 01/07/22 1348 01/07/22 2125  BP: 120/82 130/70 123/77 103/64  Pulse: 88 91 (!) 108 (!) 108  Resp: '16 16 18 17  ' Temp: 98 F (36.7 C) 98.7 F (37.1 C) 98.3 F (36.8 C) 99.8 F (37.7 C)  TempSrc: Oral  Axillary Oral  SpO2: 100% 100% 98% 95%  Weight:      Height:        Intake/Output Summary (Last 24 hours) at 01/07/2022 2207 Last data filed at 01/07/2022 1800 Gross per 24 hour  Intake 2863.75 ml  Output 2850 ml  Net 13.75 ml   Filed Weights   12/17/21 0500 12/24/21 0500 12/30/21 0635  Weight: 76.5 kg 77.9 kg 73.3 kg    Examination: General: No acute distress. Cardiovascular: RRR Lungs: unlabored Abdomen: ostomy, midline dressing, mucous fistula Neurological: alert, no complaints, able to have somewhat of pleasant conversation - L foot drop, bilateral LE weakness, R>LUE weakness Skin: Warm and dry. No rashes or lesions. Extremities: No clubbing or cyanosis. No edema.   Data Reviewed: I have personally reviewed following labs and imaging studies  CBC: Recent Labs  Lab 01/02/22 2144 01/03/22 0445 01/04/22 0540 01/05/22 0800 01/06/22 0347  WBC 9.9 9.5 8.2 7.7 7.7  NEUTROABS 7.9* 7.7 6.0 5.5 5.5  HGB 8.1* 8.1* 8.2* 7.5* 7.5*  HCT 27.2* 26.8* 27.9* 25.4* 25.4*  MCV 89.8 88.7 90.9 92.0 91.4  PLT 306 300 298 266 989    Basic Metabolic Panel: Recent Labs  Lab 01/03/22 0445 01/04/22 0540 01/05/22 0800 01/06/22 0347 01/07/22 0418  NA 142 143 144 141 137  K 3.7 3.6 3.7 3.9 4.0  CL 108 112* 114* 114* 110  CO2 '26 27 27 24 24  ' GLUCOSE 142* 166* 150* 135* 155*  BUN 22 26* 23 24* 19  CREATININE 0.48 0.40* 0.40* <0.30* 0.32*  CALCIUM 10.3 9.9 9.4 9.1 9.4  MG 2.0 2.2 2.2 2.2 1.8  PHOS 3.0 2.6 2.4* 2.8 3.1    GFR: Estimated Creatinine Clearance: 73.8 mL/min (Sherry Hampton) (by C-G formula based on SCr of 0.32 mg/dL (L)).  Liver Function Tests: Recent Labs  Lab 01/02/22 0356 01/02/22 2144 01/03/22 0445  01/04/22 0532 01/04/22 1540 01/05/22 0800 01/06/22 0347 01/07/22 0418  AST 21 17  --   --   --  19 24  --   ALT 21 17  --   --   --  18 22  --   ALKPHOS 91 83  --   --   --  74 69  --   BILITOT 0.4 0.3  --   --   --  0.3 0.3  --   PROT 7.4 6.9  --   --   --  6.7 6.4*  --   ALBUMIN 1.9* 1.7*   < > 1.8* 1.9* 1.7* 1.7* 1.6*   < > = values in this interval not displayed.    CBG: Recent Labs  Lab 01/06/22 2016 01/07/22 0104 01/07/22 0731 01/07/22 1353 01/07/22 2031  GLUCAP 98 123* 166* 93 95     Recent Results (from the past 240 hour(s))  Urine Culture     Status: Abnormal   Collection Time: 12/31/21 10:48 AM   Specimen:  In/Out Cath Urine  Result Value Ref Range Status   Specimen Description   Final    IN/OUT CATH URINE Performed at [facility], [place] [address], [place], [place] [address]    Special Requests   Final    NONE Performed at [facility], [place] [address], [place], [place] [address]    Culture >=100,000 COLONIES/mL ENTEROCOCCUS FAECALIS (Sherry Hampton)  Final   Report Status 01/02/2022 FINAL  Final   Organism ID, Bacteria ENTEROCOCCUS FAECALIS (Sherry Hampton)  Final      Susceptibility   Enterococcus faecalis - MIC*    AMPICILLIN <=2 SENSITIVE Sensitive     NITROFURANTOIN <=16 SENSITIVE Sensitive     VANCOMYCIN 1 SENSITIVE Sensitive     * >=100,000 COLONIES/mL ENTEROCOCCUS FAECALIS         Radiology Studies: EEG adult  Result Date: Jan 25, 2022 Sherry Billing, Sherry Hampton     25-Jan-2022  9:57 PM [facility] Routine EEG Report Duration: 22 min Patient Name:   Sherry Hampton Date of Birth:   1956-09-28 Identification Number:   MRN - [id number] Date of Study:   January 25, 2022 15:49:32 Indication: Encephalopathy, Technical Summary: Sherry Hampton routine 20 channel electroencephalogram using the international 10-20 system of electrode placement was performed. Background: 5-6 Hz, Poorly formed States      Awake  Abnormalities Generalized Slowing: Diffuse generalized slowing Background Slowing: The background consists of 20-50 uV, 5-6 Hz diffuse activity with superimposed diffuse polymorphic delta activity that is non reactive to external stimulation. Activation Procedures Hyperventilation: Not performed Photic Stimulation: Classification: Abnormal : Diagnosis: This is abnormal EEG, the Presence of Generalized slowing is consistent with Encephalopathy Dr Sherry Hampton [facility] [phone] Case [id number]       Scheduled Meds:  acetaminophen (TYLENOL) oral liquid 160 mg/5 mL  1,000 mg Oral Q6H   alteplase  2 mg Intracatheter Once   chlorhexidine  15 mL Mouth Rinse BID   Chlorhexidine Gluconate Cloth  6 each Topical Daily   enoxaparin (LOVENOX) injection  40 mg Subcutaneous Q24H   feeding supplement  237 mL Oral TID BM   ferrous sulfate  325 mg Oral BID WC   folic acid  1 mg Oral Daily   insulin aspart  0-9 Units Subcutaneous 4 times per day   mouth rinse  15 mL Mouth Rinse q12n4p   mirtazapine  15 mg Oral QHS   multivitamin with minerals  1 tablet Oral Daily   niacin  100 mg Oral QHS   nutrition supplement (JUVEN)  1 packet Oral BID BM   pantoprazole  40 mg Oral QHS   psyllium  1 packet Oral BID   [START ON 01/08/2022] vitamin B-6  100 mg Oral Daily   sodium chloride flush  10-40 mL Intracatheter Q12H   thiamine  100 mg Oral BID   Continuous Infusions:  sodium chloride Stopped (12/24/21 1137)   sodium chloride 75 mL/hr at 01/07/22 1748   piperacillin-tazobactam 3.375 g (01/07/22 2053)     LOS: 51 days    Time spent: over 30 min    Sherry Helper, Sherry Hampton [facility]   To contact the attending provider between 7A-7P or the covering provider during after hours 7P-7A, please log into the web site [email or web address] and access using universal [place] password for that web site. If you do not have the password, please call the hospital operator.  01/07/2022, 10:07 PM

## 2022-01-07 NOTE — Progress Notes (Signed)
Again tried to call brother, Milbert Coulter, whose number is in the chart and got his voicemail.  Voicemail again left that I was trying to reach him to discuss his sister's care.  Henreitta Cea 12:54 PM 01/07/2022

## 2022-01-07 NOTE — Progress Notes (Signed)
PHARMACY - TOTAL PARENTERAL NUTRITION CONSULT NOTE   Indication:  Inability to meet enteral needs  Patient Measurements: Height: 5\' 10"  (177.8 cm) Weight: 73.3 kg (161 lb 9.6 oz) IBW/kg (Calculated) : 68.5 TPN AdjBW (KG): 79.8 Body mass index is 23.19 kg/m. Usual Weight: 80 kg  Assessment: 66 y.o. 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 She is now extended right colectomy and ileostomy. She developed acute metabolic encephalopathy post-op. She was initiated on TF, but tube became clogged on 12/8 and patient refused replacement, and is not eating enough to maintain nutrition.  Calorie count was extended to end 1/5 PM as patient did not consume 3 full meals on 1/4.  CCS ordered TPN to start on 12/12, pharmacy to manage.  Family does not wish to proceed with G-tube placement.  CCS made request on 01/02/22 to titrate TPN from half rate to full rate since pt has poor oral intake and not doing tube feed.  Glucose / Insulin: No hx DM.   - CBGs:  90-166, controlled while on full rate cyclic TPN, no hypoglycemia noted while cyclic TPN off - 2 unit of insulin used in past 24 hours  Electrolytes:  Magnesium slightly low at 1.8, others Elytes WNL, CorrCa elevated at 11.3(no Ca in TPN) Renal: SCr <1 stable, BUN 19 Hepatic: LFTs WNL, Albumin low Trig: WNL (1/16) Intake / Output: net I/O +949 mL - UOP: 1700  mL - stool:  output  385  ml MIVF: NS 100 ml/hr (continue IVF per Dr Florene Glen on 1/15 for hypercalcemia) GI Imaging:  - 12/27 abd CT: Postsurgical changes with a right lower quadrant ileostomy and left colostomy. No bowel obstruction. 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 Interval decrease in the size of the fluid density inferior to  the gallbladder. No drainable fluid collection Identified. Complex collection in the deep subcutaneous soft tissues of theanterior abdominal wall along the midline extending to the skin at the level of the umbilicus - 1/1 abd CT: There is again noted wall thickening and inflammatory changes involving small bowel loops in the right lower quadrant near the ileostomy suggesting enteritis with surrounding inflammation - 1/11 abd CT: Inflammatory tissue in the right abdomen at the level of the ostomy extending to the inferior tip the liver has mildly increased. There is new likely early fluid collection development anterior to the right kidney worrisome for phlegmon or abscess. Subcutaneous fluid collection at the level of the umbilicus has increased in size and now contains air worrisome for abscess.  GI Surgeries / Procedures:  11/27: Expiratory laparotomy, extended right colectomy, end ileostomy  Central access: PICC placed 12/11 TPN start date: 12/12  Nutritional Goals: Cyclic TPN at full rate with volume of 2040 mL will provide 102 gm protein and 1998 Kcal  RD Assessment: Estimated Needs Total Energy Estimated Needs: 1950-2150 Total Protein Estimated Needs: 95-105g Total Fluid Estimated Needs: 2L/day  Current Nutrition:  Regular diet- Minimal intake, patient noted to be pocketing food and will spit out - Ensure Enlive TID (20 g of protein and 350 kcal)  - Juven supplement packet (2.5g of protein and 95 kcal)  - cyclic TPN   Now: - magnesium sulfate 2 gr IV x1   Plan:  Continue full rate cyclic TPN: 2952 mL over 14 hrs.   Electrolytes in TPN. Na 57mEq/L  K 70 mEq/L Ca 0 mEq/L Mg 7 mEq/L Phos 27 mmol/L Cl:Ac max Ac Continue multivitamin PO; Thiamine PO BID per neurology Sensitive SSI 4x a day based cbgs drawn: 2 hrs past cyclic TPN start + 1 hr past cycle TPN d/ced + middle of TPN infusion + while off TPN (8p, 2a, 9a, noon) BMP, magnesium, phosphorus with AM labs   Monitor TPN  labs on Mon/Thurs    Royetta Asal, PharmD, BCPS 01/07/2022 10:20 AM

## 2022-01-07 NOTE — Progress Notes (Signed)
Dressing to midline abdominal incision/wound changed per order, wound bed red with granulation tissue, lower area of wound still with small amount of purulent drainage, patient tolerated well, denies pain, will monitor.

## 2022-01-07 NOTE — Progress Notes (Signed)
Progress Note  51 Days Post-Op  Subjective: Pt resting comfortably in bed  Objective: Vital signs in last 24 hours: Temp:  [98 F (36.7 C)-99.2 F (37.3 C)] 98.7 F (37.1 C) (01/17 0442) Pulse Rate:  [88-105] 91 (01/17 0442) Resp:  [16-18] 16 (01/17 0442) BP: (120-130)/(70-82) 130/70 (01/17 0442) SpO2:  [100 %] 100 % (01/17 0442) Last BM Date: 01/07/22  Intake/Output from previous day: 01/16 0701 - 01/17 0700 In: 3734.2 [P.O.:120; I.V.:3557.5; IV Piggyback:56.7] Out: 2785 [Urine:2400; Stool:385] Intake/Output this shift: No intake/output data recorded.  PE: General: resting, NAD Resp: normal work of breathing Abdomen: soft, nondistended, nontender to palpation. Midline incision with less drainage. Ileostomy productive. 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 Neuro: FC, no focal deficits this AM but generalized weakness   Lab Results:  Recent Labs    01/05/22 0800 01/06/22 0347  WBC 7.7 7.7  HGB 7.5* 7.5*  HCT 25.4* 25.4*  PLT 266 280   BMET Recent Labs    01/06/22 0347 01/07/22 0418  NA 141 137  K 3.9 4.0  CL 114* 110  CO2 24 24  GLUCOSE 135* 155*  BUN 24* 19  CREATININE <0.30* 0.32*  CALCIUM 9.1 9.4   PT/INR No results for input(s): LABPROT, INR in the last 72 hours. CMP     Component Value Date/Time   NA 137 01/07/2022 0418   K 4.0 01/07/2022 0418   CL 110 01/07/2022 0418   CO2 24 01/07/2022 0418   GLUCOSE 155 (H) 01/07/2022 0418   BUN 19 01/07/2022 0418   CREATININE 0.32 (L) 01/07/2022 0418   CALCIUM 9.4 01/07/2022 0418   CALCIUM 10.9 (H) 01/01/2022 0449   PROT 6.4 (L) 01/06/2022 0347   ALBUMIN 1.6 (L) 01/07/2022 0418   AST 24 01/06/2022 0347   ALT 22 01/06/2022 0347   ALKPHOS 69 01/06/2022 0347   BILITOT 0.3 01/06/2022 0347   GFRNONAA >60 01/07/2022 0418   Lipase     Component Value Date/Time   LIPASE 22 11/17/2021 0955       Studies/Results: MR BRAIN WO CONTRAST  Result Date: 01/05/2022 CLINICAL DATA:  Neuro  deficit, acute, stroke suspected EXAM: MRI HEAD WITHOUT CONTRAST TECHNIQUE: Multiplanar, multiecho pulse sequences of the brain and surrounding structures were obtained without intravenous contrast. COMPARISON:  12/11/2021 FINDINGS: Motion artifact is present and is significant on several sequences. Brain: There is no acute infarction or intracranial hemorrhage. There is no intracranial mass, mass effect, or edema. There is no hydrocephalus or extra-axial fluid collection. Stable prominence of the ventricles and sulci reflecting parenchymal volume loss. Patchy and confluent areas of T2 hyperintensity in the supratentorial white matter are nonspecific but probably reflects stable chronic microvascular ischemic changes. Vascular: Major vessel flow voids at the skull base are preserved. Skull and upper cervical spine: Normal marrow signal is preserved. Sinuses/Orbits: Minor mucosal thickening.  Orbits are unremarkable. Other: Sella is unremarkable.  Mastoid air cells are clear. IMPRESSION: No evidence of recent infarction, hemorrhage, or mass. Chronic microvascular ischemic changes. No substantial change from recent prior study. Electronically Signed   By: Macy Mis M.D.   On: 01/05/2022 11:13   EEG adult  Result Date: 01/06/2022 Tsosie Billing, MD     01/06/2022  9:57 PM [facility] Routine EEG Report Duration: 22 min Patient Name:   [name] Date of Birth:   [birth year] Identification Number:   MRN - [id number] Date of Study:   01/06/2022 15:49:32 Indication: Encephalopathy, Technical Summary: A  routine 20 channel electroencephalogram using the international 10-20 system of electrode placement was performed. Background: 5-6 Hz, Poorly formed States      Awake Abnormalities Generalized Slowing: Diffuse generalized slowing Background Slowing: The background consists of 20-50 uV, 5-6 Hz diffuse activity with superimposed diffuse polymorphic delta  activity that is non reactive to external stimulation. Activation Procedures Hyperventilation: Not performed Photic Stimulation: Classification: Abnormal : Diagnosis: This is abnormal EEG, the Presence of Generalized slowing is consistent with Encephalopathy Dr Tsosie Billing [facility] [phone] Case [id number]   Anti-infectives: Anti-infectives (From admission, onward)    Start     Dose/Rate Route Frequency Ordered Stop   01/01/22 2100  piperacillin-tazobactam (ZOSYN) IVPB 3.375 g        3.375 g 12.5 mL/hr over 240 Minutes Intravenous Every 8 hours 01/01/22 2042     11/25/21 1000  piperacillin-tazobactam (ZOSYN) IVPB 3.375 g  Status:  Discontinued        3.375 g 12.5 mL/hr over 240 Minutes Intravenous Every 8 hours 11/25/21 0909 12/01/21 0822   11/17/21 1830  piperacillin-tazobactam (ZOSYN) IVPB 3.375 g        3.375 g 12.5 mL/hr over 240 Minutes Intravenous Every 8 hours 11/17/21 1415 11/22/21 2359   11/17/21 1230  piperacillin-tazobactam (ZOSYN) IVPB 3.375 g        3.375 g 100 mL/hr over 30 Minutes Intravenous  Once 11/17/21 1220 11/17/21 1304        Assessment/Plan 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 Harlow Asa 11/27 - path: necrotic colon at site of perforation, no malignancy or dysplasia noted  - Ileostomy output improved. Continue BID metamucil.  - WOC following for ileostomy, mucous fistula - Wound with blue-green, musty smelling drainage 1/11 - Dakin's TID x72 hrs. Wound probed and deeper tract opened after CT 1/11 with return of purulent drainage.   - upon DC of Dakin's would like TID dry dressing to help absorb drainage  - Pathology benign. CT distal descending/proximal sigmoid bowel luminal collapse. - CT 1/11 - some extravasated oral contrast in the subcutaneous abdominal wall superior and lateral to the ostomy at this level. There is inflammatory tissue at the level of the  ostomy extending superiorly abutting the inferior tip of the liver and fundus of the gallbladder similar to the prior examination. There is likely some early fluid collection development anterior to the right kidney image 7/31 measuring 1.9 by 1.4 by 3.7 cm. Previously described fluid collections near the ostomy are not as well defined on the current study. 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 There is no free intraperitoneal air or ascites. Small air-fluid collection at the level of the umbilicus is again seen containing air. This collection measures 2.5 x 0.7 by 1.3 cm and has mildly increased in size. Air in the collection is new from prior. - no collection around the ileostomy that is drainable - the best way to try to resolve this is abx and improving nutrition   FEN - Dysphagia 2 diet. TPN/needs g-tube but family refusing currently   VTE - SCDs, Lovenox ID - zosyn 11/27>12/2, 12/5>12/11, restarted 1/11>> likely 7 -10 days Foley - None currently    - encephalopathy/FTT - appreciate consultation at request of Dr. Johnnye Sima. Neurology saw a few weeks ago and signed off. On folic acid and thiamine supplementation. Family requesting further neurologic testing -  appreciate Neuro seeing again. Appreciate TRH following as well.   - psych saw 12/27 and determined patient does not have capacity   - frequent urination - Urine Cx with enterococcus faecalis sensitive to ampicillin, Zosyn should be adequate coverage   Dispo: Patient  needs SNF, but can't go with TNA.  Family refusing g-tube.  Unclear end point.  Will try to reach [place], brother again today.   LOS: 51 days   I reviewed nursing notes, hospitalist notes, last 24 h vitals and pain scores, last 48 h intake and output, and last 24 h labs and trends.    Henreitta Cea, [facility] 01/07/2022, 9:53 AM Please see  Amion for pager number during day hours 7:00am-4:30pm

## 2022-01-07 NOTE — Assessment & Plan Note (Signed)
replace

## 2022-01-07 NOTE — Progress Notes (Signed)
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 Harlow Asa 11/27.  Per recent MD notes: "Encephalopathy/FTT - Patient has been on folic acid and thiamine supplements. Noted to have poor motor control and coordination the other day by PT which is different from last week per therapy notes, Neurology consulted - appreciated their input. No further workup needed at this time from their standpoint.    - psych saw 12/27 and determined patient does not have capacity. Patient has been worsening since admission.  She was confused from POD 1 on. Question if she was like this prior to admission, but lived alone and no one aware of these issues." Rapid response called 1/12/ for AMS   OT comments  Patient was noted to be in poor mood on this date. Patient declined most tasks but did agree for repositioning to optimize comfort in bed. Patient unable to tolerate chair positioning in bed with reports of pain in stomach. Nurse made aware.Patient's discharge plan remains appropriate at this time. OT will continue to follow acutely.     Recommendations for follow up therapy are one component of a multi-disciplinary discharge planning process, led by the attending physician.  Recommendations may be updated based on patient status, additional functional criteria and insurance authorization.    Follow Up Recommendations  Skilled nursing-short term rehab (<3 hours/day)    Assistance Recommended at Discharge Frequent or constant Supervision/Assistance  Patient can return home with the following  A lot of help with bathing/dressing/bathroom;Two people to help with bathing/dressing/bathroom;Two people to help with walking and/or transfers;A lot of help with walking and/or  transfers;Assistance with cooking/housework   Equipment Recommendations  Other (comment) (defer to next venue)    Recommendations for Other Services      Precautions / Restrictions Precautions Precautions: Fall Precaution Comments: abdominal surgery, RUQ ileostomy Restrictions Weight Bearing Restrictions: No       Mobility Bed Mobility Overal bed mobility: Needs Assistance   Rolling: Max assist, +2 for physical assistance         General bed mobility comments: patient was leaning to the R side upon entrance. patient agreed to repositining in bed with max A x2 with patient unable to tolerate chair on this date holding belly and  reporting pain.    Transfers                         Balance                                           ADL either performed or assessed with clinical judgement   ADL Overall ADL's : Needs assistance/impaired   Eating/Feeding Details (indicate cue type and reason): patient declined to participate in all tasks stating 'hell no". and " how dare you ask me that".                                        Extremity/Trunk Assessment              Vision       Perception  Praxis      Cognition Arousal/Alertness: Awake/alert Behavior During Therapy: WFL for tasks assessed/performed Overall Cognitive Status: Impaired/Different from baseline                                 General Comments: patient was confused with anger towards this therapist during session.        Exercises      Shoulder Instructions       General Comments      Pertinent Vitals/ Pain       Pain Assessment Pain Assessment: Faces Faces Pain Scale: Hurts even more Pain Location: all over with all touch Pain Descriptors / Indicators: Grimacing, Moaning, Guarding Pain Intervention(s): Limited activity within patient's tolerance, Monitored during session  Home Living                                           Prior Functioning/Environment              Frequency  Min 2X/week        Progress Toward Goals  OT Goals(current goals can now be found in the care plan section)  Progress towards OT goals: Not progressing toward goals - comment (pain impacting participation in all tasks)     Plan Discharge plan remains appropriate    Co-evaluation                 AM-PAC OT "6 Clicks" Daily Activity     Outcome Measure   Help from another person eating meals?: A Lot Help from another person taking care of personal grooming?: A Lot Help from another person toileting, which includes using toliet, bedpan, or urinal?: Total Help from another person bathing (including washing, rinsing, drying)?: Total Help from another person to put on and taking off regular upper body clothing?: Total Help from another person to put on and taking off regular lower body clothing?: Total 6 Click Score: 8    End of Session    OT Visit Diagnosis: Unsteadiness on feet (R26.81);Other symptoms and signs involving cognitive function;Pain;History of falling (Z91.81);Repeated falls (R29.6);Muscle weakness (generalized) (M62.81) Pain - Right/Left: Right Pain - part of body: Shoulder   Activity Tolerance Patient limited by pain   Patient Left in bed;with call bell/phone within reach;with bed alarm set   Nurse Communication Other (comment) (patients disposition and reports of pain during session)        Time: 0355-9741 OT Time Calculation (min): 15 min  Charges: OT General Charges $OT Visit: 1 Visit OT Treatments $Therapeutic Activity: 8-22 mins  Jackelyn Poling OTR/L, MS Acute Rehabilitation Department Office# [phone] Pager# [phone]   Marcellina Millin 01/07/2022, 3:31 PM

## 2022-01-08 LAB — CBC WITH DIFFERENTIAL/PLATELET
Abs Immature Granulocytes: 0.04 10*3/uL (ref 0.00–0.07)
Basophils Absolute: 0 10*3/uL (ref 0.0–0.1)
Basophils Relative: 0 %
Eosinophils Absolute: 0.5 10*3/uL (ref 0.0–0.5)
Eosinophils Relative: 7 %
HCT: 27.4 % — ABNORMAL LOW (ref 36.0–46.0)
Hemoglobin: 8.3 g/dL — ABNORMAL LOW (ref 12.0–15.0)
Immature Granulocytes: 1 %
Lymphocytes Relative: 18 %
Lymphs Abs: 1.4 10*3/uL (ref 0.7–4.0)
MCH: 26.7 pg (ref 26.0–34.0)
MCHC: 30.3 g/dL (ref 30.0–36.0)
MCV: 88.1 fL (ref 80.0–100.0)
Monocytes Absolute: 0.6 10*3/uL (ref 0.1–1.0)
Monocytes Relative: 8 %
Neutro Abs: 5.1 10*3/uL (ref 1.7–7.7)
Neutrophils Relative %: 66 %
Platelets: 288 10*3/uL (ref 150–400)
RBC: 3.11 MIL/uL — ABNORMAL LOW (ref 3.87–5.11)
RDW: 17.2 % — ABNORMAL HIGH (ref 11.5–15.5)
WBC: 7.7 10*3/uL (ref 4.0–10.5)
nRBC: 0 % (ref 0.0–0.2)

## 2022-01-08 LAB — COMPREHENSIVE METABOLIC PANEL
ALT: 88 U/L — ABNORMAL HIGH (ref 0–44)
AST: 120 U/L — ABNORMAL HIGH (ref 15–41)
Albumin: 1.7 g/dL — ABNORMAL LOW (ref 3.5–5.0)
Alkaline Phosphatase: 85 U/L (ref 38–126)
Anion gap: 3 — ABNORMAL LOW (ref 5–15)
BUN: 19 mg/dL (ref 8–23)
CO2: 23 mmol/L (ref 22–32)
Calcium: 10 mg/dL (ref 8.9–10.3)
Chloride: 109 mmol/L (ref 98–111)
Creatinine, Ser: 0.56 mg/dL (ref 0.44–1.00)
GFR, Estimated: >60 mL/min (ref >60)
Glucose, Bld: 92 mg/dL (ref 70–99)
Potassium: 3.6 mmol/L (ref 3.5–5.1)
Sodium: 135 mmol/L (ref 135–145)
Total Bilirubin: 0.6 mg/dL (ref 0.3–1.2)
Total Protein: 6.6 g/dL (ref 6.5–8.1)

## 2022-01-08 LAB — PHOSPHORUS: Phosphorus: 3.4 mg/dL (ref 2.5–4.6)

## 2022-01-08 LAB — GLUCOSE, CAPILLARY
Glucose-Capillary: 94 mg/dL (ref 70–99)
Glucose-Capillary: 107 mg/dL — ABNORMAL HIGH (ref 70–99)
Glucose-Capillary: 117 mg/dL — ABNORMAL HIGH (ref 70–99)
Glucose-Capillary: 97 mg/dL (ref 70–99)
Glucose-Capillary: 88 mg/dL (ref 70–99)

## 2022-01-08 LAB — MAGNESIUM: Magnesium: 2.2 mg/dL (ref 1.7–2.4)

## 2022-01-08 NOTE — Progress Notes (Signed)
For the third day in a row I have tried to contact, [name], patient's point of contact decided upon by her family, and again his phone has gone to voicemail.  I have again left another voicemail that I was calling for an update and discussion regarding her clinical status.  Henreitta Cea 10:38 AM 01/08/2022

## 2022-01-08 NOTE — Progress Notes (Signed)
Progress Note  52 Days Post-Op  Subjective: Pt resting comfortably in bed.  Refusing to eat even when offered by nurse techs.  Closes her mouth and shakes her head no.  Objective: Vital signs in last 24 hours: Temp:  [98.3 F (36.8 C)-99.8 F (37.7 C)] 98.7 F (37.1 C) (01/18 9741) Pulse Rate:  [100-108] 100 (01/18 0613) Resp:  [15-18] 15 (01/18 0613) BP: (91-123)/(54-77) 91/54 (01/18 6384) SpO2:  [95 %-98 %] 97 % (01/18 0613) Last BM Date: 01/07/22  Intake/Output from previous day: 01/17 0701 - 01/18 0700 In: 2350.4 [P.O.:240; I.V.:1867; IV Piggyback:243.4] Out: 2900 [Urine:2650; Stool:250] Intake/Output this shift: Total I/O In: 0  Out: 90 [Urine:15; Stool:75]  PE: General: resting, NAD Resp: normal work of breathing Abdomen: soft, nondistended, nontender to palpation. Midline incision with less drainage. Ileostomy productive. 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 Neuro: not assessed as she was resting   Lab Results:  Recent Labs    01/06/22 0347 01/08/22 0338  WBC 7.7 7.7  HGB 7.5* 8.3*  HCT 25.4* 27.4*  PLT 280 288   BMET Recent Labs    01/07/22 0418 01/08/22 0338  NA 137 135  K 4.0 3.6  CL 110 109  CO2 24 23  GLUCOSE 155* 92  BUN 19 19  CREATININE 0.32* 0.56  CALCIUM 9.4 10.0   PT/INR No results for input(s): LABPROT, INR in the last 72 hours. CMP     Component Value Date/Time   NA 135 01/08/2022 0338   K 3.6 01/08/2022 0338   CL 109 01/08/2022 0338   CO2 23 01/08/2022 0338   GLUCOSE 92 01/08/2022 0338   BUN 19 01/08/2022 0338   CREATININE 0.56 01/08/2022 0338   CALCIUM 10.0 01/08/2022 0338   CALCIUM 10.9 (H) 01/01/2022 0449   PROT 6.6 01/08/2022 0338   ALBUMIN 1.7 (L) 01/08/2022 0338   AST 120 (H) 01/08/2022 0338   ALT 88 (H) 01/08/2022 0338   ALKPHOS 85 01/08/2022 0338   BILITOT 0.6 01/08/2022 0338   GFRNONAA >60 01/08/2022 0338   Lipase     Component Value Date/Time   LIPASE 22 11/17/2021 0955        Studies/Results: EEG adult  Result Date: 01/06/2022 Tsosie Billing, MD     01/06/2022  9:57 PM [facility] Tele[facility] Consult [facility] Routine EEG Report Duration: 22 min Patient Name:   Sherry Hampton Date of Birth:   [birth year] Identification Number:   MRN - [id number] Date of Study:   01/06/2022 15:49:32 Indication: Encephalopathy, Technical Summary: A routine 20 channel electroencephalogram using the international 10-20 system of electrode placement was performed. Background: 5-6 Hz, Poorly formed States      Awake Abnormalities Generalized Slowing: Diffuse generalized slowing Background Slowing: The background consists of 20-50 uV, 5-6 Hz diffuse activity with superimposed diffuse polymorphic delta activity that is non reactive to external stimulation. Activation Procedures Hyperventilation: Not performed Photic Stimulation: Classification: Abnormal : Diagnosis: This is abnormal EEG, the Presence of Generalized slowing is consistent with Encephalopathy Dr Tsosie Billing [facility] [phone] Case [id number]   Anti-infectives: Anti-infectives (From admission, onward)    Start     Dose/Rate Route Frequency Ordered Stop   01/01/22 2100  piperacillin-tazobactam (ZOSYN) IVPB 3.375 g        3.375 g 12.5 mL/hr over 240 Minutes Intravenous Every 8 hours 01/01/22 2042     11/25/21 1000  piperacillin-tazobactam (ZOSYN) IVPB 3.375 g  Status:  Discontinued        3.375 g 12.5  mL/hr over 240 Minutes Intravenous Every 8 hours 11/25/21 0909 12/01/21 0822   11/17/21 1830  piperacillin-tazobactam (ZOSYN) IVPB 3.375 g        3.375 g 12.5 mL/hr over 240 Minutes Intravenous Every 8 hours 11/17/21 1415 11/22/21 2359   11/17/21 1230  piperacillin-tazobactam (ZOSYN) IVPB 3.375 g        3.375 g 100 mL/hr over 30 Minutes Intravenous  Once 11/17/21 1220 11/17/21 1304        Assessment/Plan S/p extended right colectomy with ileostomy for perforated right colon  with 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 Harlow Asa 11/27 - path: necrotic colon at site of perforation, no malignancy or dysplasia noted  - Ileostomy output improved. Continue BID metamucil.  - WOC following for ileostomy, mucous fistula - Wound with blue-green, musty smelling drainage 1/11 - Dakin's TID x72 hrs. Wound probed and deeper tract opened after CT 1/11 with return of purulent drainage.   - upon DC of Dakin's would like TID dry dressing to help absorb drainage  - Pathology benign. CT distal descending/proximal sigmoid bowel luminal collapse. - CT 1/11 - some extravasated oral contrast in the subcutaneous abdominal wall superior and lateral to the ostomy at this level. There is inflammatory tissue at the level of the ostomy extending superiorly abutting the inferior tip of the liver and fundus of the gallbladder similar to the prior examination. There is likely some early fluid collection development anterior to the right kidney image 7/31 measuring 1.9 by 1.4 by 3.7 cm. Previously described fluid collections near the ostomy are not as well defined on the current study. 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 There is no free intraperitoneal air or ascites. Small air-fluid collection at the level of the umbilicus is again seen containing air. This collection measures 2.5 x 0.7 by 1.3 cm and has mildly increased in size. Air in the collection is new from prior. - no collection around the ileostomy that is drainable - the best way to try to resolve this is abx and improving nutrition -Dr. Hassell Done has stopped her TNA yesterday 1/17   FEN - Dysphagia 2 diet/needs g-tube but family refusing currently   VTE - SCDs, Lovenox ID - zosyn 11/27>12/2, 12/5>12/11, restarted 1/11>> likely 7 -10 days Foley - None currently    - encephalopathy/FTT - appreciate consultation at request of Dr.  Johnnye Sima. Neurology saw a few weeks ago and signed off. On folic acid and thiamine supplementation. Family requesting further neurologic testing - appreciate Neuro seeing again. Appreciate TRH following as well.   - psych saw 12/27 and determined patient does not have capacity   - frequent urination - Urine Cx with enterococcus faecalis sensitive to ampicillin, Zosyn should be adequate coverage   Dispo: Patient  needs SNF, but can't go with TNA.  Family refusing g-tube.  Unclear end point.  Will try to reach [place], brother again today.   LOS: 52 days   I reviewed nursing notes, hospitalist notes, last 24 h vitals and pain scores, last 48 h intake and output, and last 24 h labs and trends.    Henreitta Cea, [facility] 01/08/2022, 9:57 AM Please see Amion for pager number during day hours 7:00am-4:30pm

## 2022-01-08 NOTE — Progress Notes (Addendum)
Had a length discussion (25 minutes) with patient's sister Sherry Hampton on the phone.  She has patient's phone so the number in the chart will reach [place], it's just Myrle's cell phone.  Sherry Hampton was very upset  during the vast majority of the phone conversation and I was quiet and listened to her frustrations.  She did confirm that Sherry Hampton, brother, is point of contact.  I did let her know that given he is unavailable to answer his phone or return calls that we may have to consider medically making decisions for the patient if the family, specifically the point of contact determined by the family, is unavailable to help [place] in her medical decision making.  She stated that he works 2 jobs and gets off work at 12 am in the morning and we should contact him when he is not at work.  We discussed that we are not at the hospital in the middle of the night and therefore unable to do this, but given he is the point of contact, this may need to be moved to another family member if he is not available.  We discussed that we have stopped her TNA at this point to try to stimulate her appetite again.  She states that the patient ate when her or her brother fed her and she is likely not eating from staff as she likely distrusts the food being offered to her.  I expressed that it would be great to see if the patient will eat when she is fed from the sister on Friday when she arrives.  If she does that would be wonderful for the patient and assist with placement.  She states the family is desperate for the patient to come to [place] for further care.  I have contacted our social worker to reach back out to the facility in [place] now that she is off of TNA to see if this changes their ability to accept her.  I explained to Sherry Hampton to address her concern re: infection, that the patient has NOT had infection for the full time she has been here as the family thinks she has.  I told her she had an initial infection that was treated upon arrival with  surgery and post op abx therapy.  For the majority of her stay she has not had infection.  She was noted just last week to have a UTI as well as new findings on her CT scan.  She is not willing to concede and agree that her malnutrition has contributed to any of her intra-abdominal issues.  We discussed that these issues are being treated with abx therapy at this time.  She does state the family decided against a g-tube due to the concern for her "ongoing infection" and the risk for "more infection" with the tube.  Ultimately, the conversation was left with the sister coming on Friday, we would talk more at that time, and see if the patient will eat from her sister.  If that is the case, then hopefully we can move forward with disposition.  Sherry Hampton 11:42 AM  01/08/2022

## 2022-01-08 NOTE — Progress Notes (Signed)
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 Harlow Asa 11/27.  Per recent MD notes: "Encephalopathy/FTT - Patient has been on folic acid and thiamine supplements. Noted to have poor motor control and coordination the other day by PT which is different from last week per therapy notes, Neurology consulted - appreciated their input. No further workup needed at this time from their standpoint.    - psych saw 12/27 and determined patient does not have capacity. Patient has been worsening since admission.  She was confused from POD 1 on. Question if she was like this prior to admission, but lived alone and no one aware of these issues." Rapid response called 1/12/ for AMS    PT Comments    Patient minimally follows simple  directions to drink  fluids. Right LE noted ankle clonus, left LE with pain to touch and move, patient moans . Patient premedicated. PLaced in bed chair position. Lists to right,unable to self assist repositioning. Propped on r side with pillows.  Unfortunately, Patient has made no progress in mobility, strength, worse actually. PT has no further skilled  intervention at this time. Patient will need custodial care. PT signing off.    Recommendations for follow up therapy are one component of a multi-disciplinary discharge planning process, led by the attending physician.  Recommendations may be updated based on patient status, additional functional criteria and insurance authorization.  Follow Up Recommendations  Long-term institutional care without follow-up therapy     Assistance Recommended at Discharge Frequent or constant Supervision/Assistance  Patient can return home with the following A lot of help with  bathing/dressing/bathroom   Equipment Recommendations  None recommended by PT    Recommendations for Other Services       Precautions / Restrictions Precautions Precautions: Fall Precaution Comments: abdominal surgery, RUQ ileostomy, Painful left leg, paresis right U/LE Restrictions Weight Bearing Restrictions: No     Mobility  Bed Mobility               General bed mobility comments: placed in bed chair postion. LLE too painful to attwempt  moving in bed.    Transfers                        Ambulation/Gait                   Stairs             Wheelchair Mobility    Modified Rankin (Stroke Patients Only)       Balance       Sitting balance - Comments: listing frequently to the right, repostion multiple times, placed pillows on right. Postural control: Right lateral lean                                  Cognition Arousal/Alertness: Awake/alert Behavior During Therapy: Flat affect Overall Cognitive Status: Impaired/Different from baseline                                 General Comments: limited interaction unless strongly stimulated. responds with moan when left foot moved, does look at therapist  when spoken to        Exercises General Exercises - Lower Extremity Ankle Circles/Pumps: PROM, 10 reps, Both Heel Slides: PROM, Both, 10 reps, Supine    General Comments        Pertinent Vitals/Pain Pain Assessment Faces Pain Scale: Hurts worst Pain Location: Left foot and leg with any slight movement Pain Descriptors / Indicators: Grimacing, Moaning, Guarding Pain Intervention(s): Monitored during session, Premedicated before session, Repositioned    Home Living                          Prior Function            PT Goals (current goals can now be found in the care plan section) Acute Rehab PT Goals PT Goal Formulation: Patient unable to participate in goal setting Progress  towards PT goals: Not progressing toward goals - comment (patient has not prgressed, unable to participate in meaningful  mobility activities.)    Frequency           PT Plan Other (comment) (PT will DC from therapy)    Co-evaluation PT/OT/SLP Co-Evaluation/Treatment: Yes Reason for Co-Treatment: To address functional/ADL transfers PT goals addressed during session: Mobility/safety with mobility;Strengthening/ROM OT goals addressed during session: ADL's and self-care      AM-PAC PT "6 Clicks" Mobility   Outcome Measure  Help needed turning from your back to your side while in a flat bed without using bedrails?: Total Help needed moving from lying on your back to sitting on the side of a flat bed without using bedrails?: Total Help needed moving to and from a bed to a chair (including a wheelchair)?: Total Help needed standing up from a chair using your arms (e.g., wheelchair or bedside chair)?: Total Help needed to walk in hospital room?: Total Help needed climbing 3-5 steps with a railing? : Total 6 Click Score: 6    End of Session   Activity Tolerance: Patient limited by pain;Patient limited by fatigue Patient left: in bed;with call bell/phone within reach;with bed alarm set Nurse Communication: Mobility status;Need for lift equipment PT Visit Diagnosis: Muscle weakness (generalized) (M62.81);Other abnormalities of gait and mobility (R26.89);Other symptoms and signs involving the nervous system (R29.898)     Time: 0354-6568 PT Time Calculation (min) (ACUTE ONLY): 24 min  Charges:  $Therapeutic Activity: 8-22 mins                     Tresa Endo PT Acute Rehabilitation Services Pager [phone] Office [phone]    Claretha Cooper 01/08/2022, 12:23 PM

## 2022-01-08 NOTE — Consult Note (Signed)
CONSULT F/U NOTE  Sherry Hampton  NKN:[id number] DOB: November 06, 1956 DOA: 11/17/2021 PCP: Pcp, No    Brief Narrative:  66 year old previously independently functional patient with a history of HLD and some memory loss who presented 11/27 with the acute onset of abdominal pain.  She required emergent surgery for a perforated viscus of the ascending colon with fecal peritonitis and required right colectomy with ileostomy.  Her hospital stay has been prolonged and complicated by fluid collections in the abdomen, malnutrition, progressive decline, and encephalopathy.  [facility] is following for assistance with general medical management.  Code Status: FULL CODE  DVT prophylaxis: Lovenox  Interim Hx: Tmax 99.8 last evening.  Blood pressure stable with systolics in the 37T.  Saturation 97% on room air.  Electrolytes balanced.  Renal function normal.  Hemoglobin stable at 8.3. Resting comfortably in bed at the time of my visit.   Assessment & Plan:  Perforated abdominal viscus POA - s/p exploratory lap, R colectomy, end ileostomy/mucous fistula 11/17/2021 Care per primary service/General Surgery  Enterococcus UTI Has completed s course of antibiotic for this indication  Mild transaminitis Monitor trend - minimize meds which are affected or effecting   Recent Labs  Lab 01/02/22 0356 01/02/22 2144 01/03/22 0445 01/04/22 1540 01/05/22 0800 01/06/22 0347 01/07/22 0418 01/08/22 0338  AST 21 17  --   --  19 24  --  120*  ALT 21 17  --   --  18 22  --  88*  ALKPHOS 91 83  --   --  74 69  --  85  BILITOT 0.4 0.3  --   --  0.3 0.3  --  0.6  PROT 7.4 6.9  --   --  6.7 6.4*  --  6.6  ALBUMIN 1.9* 1.7*   < > 1.9* 1.7* 1.7* 1.6* 1.7*   < > = values in this interval not displayed.    Acute metabolic encephalopathy Mental status continues to wax and wane -has undergone extensive work-up -MRI brain 12/21 without acute findings and repeat 1/15 again without acute findings -EEG only noted slowing  consistent with encephalopathy -Neurology felt most consistent with acute delirium of hospitalization  B6 deficiency Supplementation ongoing   Folic acid deficiency Supplementation ongoing   Hypercalcemia  Severe protein calorie malnutrition  Family Communication: no family present at time of visit   Objective: Blood pressure 106/68, pulse 99, temperature (!) 97.3 F (36.3 C), temperature source Oral, resp. rate 16, height 5\' 10"  (1.778 m), weight 73.3 kg, SpO2 96 %.  Intake/Output Summary (Last 24 hours) at 01/08/2022 1644 Last data filed at 01/08/2022 1639 Gross per 24 hour  Intake 2557.15 ml  Output 2050 ml  Net 507.15 ml    Filed Weights   12/17/21 0500 12/24/21 0500 12/30/21 0635  Weight: 76.5 kg 77.9 kg 73.3 kg    Examination: General: No acute respiratory distress Lungs: Clear to auscultation bilaterally  Cardiovascular: Regular rate and rhythm   CBC: Recent Labs  Lab 01/05/22 0800 01/06/22 0347 01/08/22 0338  WBC 7.7 7.7 7.7  NEUTROABS 5.5 5.5 5.1  HGB 7.5* 7.5* 8.3*  HCT 25.4* 25.4* 27.4*  MCV 92.0 91.4 88.1  PLT 266 280 024    Basic Metabolic Panel: Recent Labs  Lab 01/06/22 0347 01/07/22 0418 01/08/22 0338  NA 141 137 135  K 3.9 4.0 3.6  CL 114* 110 109  CO2 24 24 23   GLUCOSE 135* 155* 92  BUN 24* 19 19  CREATININE <0.30* 0.32*  0.56  CALCIUM 9.1 9.4 10.0  MG 2.2 1.8 2.2  PHOS 2.8 3.1 3.4    GFR: Estimated Creatinine Clearance: 73.8 mL/min (by C-G formula based on SCr of 0.56 mg/dL).  Liver Function Tests: Recent Labs  Lab 01/02/22 2144 01/03/22 0445 01/05/22 0800 01/06/22 0347 01/07/22 0418 01/08/22 0338  AST 17  --  19 24  --  120*  ALT 17  --  18 22  --  88*  ALKPHOS 83  --  74 69  --  85  BILITOT 0.3  --  0.3 0.3  --  0.6  PROT 6.9  --  6.7 6.4*  --  6.6  ALBUMIN 1.7*   < > 1.7* 1.7* 1.6* 1.7*   < > = values in this interval not displayed.     Recent Labs  Lab 01/02/22 2145  AMMONIA 15     CBG: Recent Labs   Lab 01/07/22 2031 01/08/22 0150 01/08/22 0727 01/08/22 1117 01/08/22 1635  GLUCAP 95 94 107* 117* 97     Recent Results (from the past 240 hour(s))  Urine Culture     Status: Abnormal   Collection Time: 12/31/21 10:48 AM   Specimen: In/Out Cath Urine  Result Value Ref Range Status   Specimen Description   Final    IN/OUT CATH URINE Performed at [facility], [place] [address] [place], [place] [address]    Special Requests   Final    NONE Performed at [facility], [place] [address] [place], [place] [address]    Culture >=100,000 COLONIES/mL ENTEROCOCCUS FAECALIS (A)  Final   Report Status 01/02/2022 FINAL  Final   Organism ID, Bacteria ENTEROCOCCUS FAECALIS (A)  Final      Susceptibility   Enterococcus faecalis - MIC*    AMPICILLIN <=2 SENSITIVE Sensitive     NITROFURANTOIN <=16 SENSITIVE Sensitive     VANCOMYCIN 1 SENSITIVE Sensitive     * >=100,000 COLONIES/mL ENTEROCOCCUS FAECALIS      Scheduled Meds:  acetaminophen (TYLENOL) oral liquid 160 mg/5 mL  1,000 mg Oral Q6H   chlorhexidine  15 mL Mouth Rinse BID   Chlorhexidine Gluconate Cloth  6 each Topical Daily   enoxaparin (LOVENOX) injection  40 mg Subcutaneous Q24H   feeding supplement  237 mL Oral TID BM   ferrous sulfate  325 mg Oral BID WC   folic acid  1 mg Oral Daily   insulin aspart  0-9 Units Subcutaneous 4 times per day   mouth rinse  15 mL Mouth Rinse q12n4p   mirtazapine  15 mg Oral QHS   multivitamin with minerals  1 tablet Oral Daily   niacin  100 mg Oral QHS   nutrition supplement (JUVEN)  1 packet Oral BID BM   pantoprazole  40 mg Oral QHS   psyllium  1 packet Oral BID   vitamin B-6  100 mg Oral Daily   sodium chloride flush  10-40 mL Intracatheter Q12H   thiamine  100 mg Oral BID   Continuous Infusions:  sodium chloride 100 mL/hr at 01/08/22 1526   piperacillin-tazobactam 3.375 g (01/08/22 1513)     LOS: 52 days   Cherene Altes, MD [facility] Office  [phone] Pager - Text Page per Amion  If 7PM-7AM, please contact night-coverage per Amion 01/08/2022, 4:44 PM

## 2022-01-08 NOTE — Ethics Note (Addendum)
° ° °  This note is in regard to a telephone conversation with attending team ([facility])  asking questions regarding patient Sherry Hampton and concerns regarding legal decision maker for a patient without capacity.  Initial conversation 01-07-22   In [place] right to natural death statute, there is a pecking order for who the legal surrogate decision maker should be when the patient lacks capacity.  Clearly this patient has family and some family member or group of family members are the appropriate surrogate decision makers.  In the fine print of the statute, it states that surrogate decision makers must be reasonably available.  It seems that  the designated family member (son/Sherry Hampton) has not reasonably available.   In such a case decision making will default to the next surrogate on the pecking order,  and ultimately to the doctor.   It is advised to move forward with due diligence to secure all viable family members willing and available to participate in this patient's treatment plan and decisions.  Suggestion made to coordinate all members of the treatment team; surgery/attending/physicians and advanced practice providers, palliative medicine team, hospitalist, physical and occupational therapist, neurology making every attempt to coordinate a team meeting with  family allowing for all information to be disseminated in a thorough, coordinated,  organized fashion.  At that time all attempts should be made to  have the majority of patient's reasonably available adult siblings weigh in on specific decisions presented from the attending team.  Secure names and contacts for all family members.   If as a group/majority rules or there is no individual willing to accept responsibility as decision-maker for this patient, ultimately decisions made fall to the physician.  Continue with clear and concise documentation    If the above is unsuccessful in securing treatment plan for patient, a re- consult to  ethics can be made.    Discussed with Dr Domenic Schwab   Sherry Lessen NP  Ethic Committee Member

## 2022-01-08 NOTE — Progress Notes (Signed)
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 Harlow Asa 11/27.  Per recent MD notes: "Encephalopathy/FTT - Patient has been on folic acid and thiamine supplements. Noted to have poor motor control and coordination the other day by PT which is different from last week per therapy notes, Neurology consulted - appreciated their input. No further workup needed at this time from their standpoint.    - psych saw 12/27 and determined patient does not have capacity. Patient has been worsening since admission.  She was confused from POD 1 on. Question if she was like this prior to admission, but lived alone and no one aware of these issues." Rapid response called 1/12/ for AMS   OT comments  Today patient exhibits continued decline in ability to perform functional tasks including ability to consistently follow commands. She is alert to self only. She is somewhat lethargic and exhibits difficulty attending to task. She requires constant verbal and tactile cues. She is unable to actively assist to perform grooming today. She is unable to consistently sip from a straw with therapist today. Patient has not met any OT goals since evaluation despite goals being downgraded. She has steadily declined despite therapy interventions. OT will sign off. Patient will require long term care at discharge. Therapist recommends considering palliative care.      Recommendations for follow up therapy are one component of a multi-disciplinary discharge planning process, led by the attending physician.  Recommendations may be updated based on patient status, additional functional criteria and insurance authorization.    Follow Up Recommendations  Long-term institutional care  without follow-up therapy    Assistance Recommended at Discharge Frequent or constant Supervision/Assistance  Patient can return home with the following  A lot of help with bathing/dressing/bathroom;Two people to help with bathing/dressing/bathroom;Two people to help with walking and/or transfers;A lot of help with walking and/or transfers;Assistance with cooking/housework;Assistance with feeding;Direct supervision/assist for medications management   Equipment Recommendations       Recommendations for Other Services      Precautions / Restrictions Precautions Precautions: Fall Precaution Comments: abdominal surgery, RUQ ileostomy, Painful left leg, paresis right U/LE Restrictions Weight Bearing Restrictions: No        ADL either performed or assessed with clinical judgement   ADL   Eating/Feeding: Total assistance;Bed level   Grooming: Total assistance;Bed level                                        Cognition Arousal/Alertness: Awake/alert Behavior During Therapy: Flat affect   Area of Impairment: Orientation, Attention, Memory, Following commands, Safety/judgement, Awareness, Problem solving                 Orientation Level: Disoriented to, Time, Situation, Place Current Attention Level: Focused Memory: Decreased recall of precautions, Decreased short-term memory Following Commands: Follows one step commands inconsistently Safety/Judgement: Decreased awareness of deficits Awareness: Intellectual Problem Solving: Slow processing, Decreased initiation, Difficulty sequencing, Requires verbal cues, Requires tactile cues                     Pertinent Vitals/ Pain       Pain Assessment Pain Assessment: Faces Faces Pain Scale: Hurts  whole lot Pain Location: Left foot and leg with any slight movement Pain Descriptors / Indicators: Grimacing, Moaning, Guarding Pain Intervention(s): Monitored during session, Premedicated before  session   Progress Toward Goals  OT Goals(current goals can now be found in the care plan section)  Progress towards OT goals: Not progressing toward goals - comment     Plan Discharge plan needs to be updated    Co-evaluation      Reason for Co-Treatment: To address functional/ADL transfers PT goals addressed during session: Mobility/safety with mobility;Strengthening/ROM OT goals addressed during session: ADL's and self-care      AM-PAC OT "6 Clicks" Daily Activity     Outcome Measure   Help from another person eating meals?: Total Help from another person taking care of personal grooming?: Total Help from another person toileting, which includes using toliet, bedpan, or urinal?: Total Help from another person bathing (including washing, rinsing, drying)?: Total Help from another person to put on and taking off regular upper body clothing?: Total Help from another person to put on and taking off regular lower body clothing?: Total 6 Click Score: 6    End of Session    OT Visit Diagnosis: Unsteadiness on feet (R26.81);Other symptoms and signs involving cognitive function;Pain;History of falling (Z91.81);Repeated falls (R29.6);Muscle weakness (generalized) (M62.81)   Activity Tolerance Patient limited by pain;Patient limited by fatigue   Patient Left in bed;with call bell/phone within reach;with bed alarm set   Nurse Communication Mobility status        Time: 1515-8265 OT Time Calculation (min): 17 min  Charges: OT General Charges $OT Visit: 1 Visit OT Treatments $Self Care/Home Management : 8-22 mins  Derl Barrow, OTR/L [place]  Office [phone] Pager: [place] 01/08/2022, 12:49 PM

## 2022-01-09 LAB — GLUCOSE, CAPILLARY
Glucose-Capillary: 87 mg/dL (ref 70–99)
Glucose-Capillary: 86 mg/dL (ref 70–99)
Glucose-Capillary: 96 mg/dL (ref 70–99)

## 2022-01-09 NOTE — Consult Note (Signed)
CONSULT F/U NOTE  Sherry Hampton  AJO:[id number] DOB: 1956/10/31 DOA: 11/17/2021 PCP: Pcp, No    Brief Narrative:  65yo previously independently functional patient with a history of HLD and some memory loss who presented 11/27 with the acute onset of abdominal pain.  She required emergent surgery for a perforated viscus of the ascending colon with fecal peritonitis and required right colectomy with ileostomy.  Her hospital stay has been prolonged and complicated by fluid collections in the abdomen, malnutrition, progressive decline, and encephalopathy.  TRH is following for assistance with general medical management.  Code Status: FULL CODE  DVT prophylaxis: Lovenox  Interim Hx: The patient did very poorly with attempts to provide PT and OT services yesterday.  She is afebrile.  Vital signs are stable.  Saturation 99% on room air.  CBG is well controlled.  Minimal interaction with me today but does snicker at a joke and follows me around the room with her eyes.  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 Care per primary service / General Surgery  Enterococcus UTI - resolved  Has completed a course of antibiotic for this indication  Mild transaminitis Monitor trend - minimize meds which are affected or effecting   Recent Labs  Lab 01/02/22 2144 01/03/22 0445 01/04/22 1540 01/05/22 0800 01/06/22 0347 01/07/22 0418 01/08/22 0338  AST 17  --   --  19 24  --  120*  ALT 17  --   --  18 22  --  88*  ALKPHOS 83  --   --  74 69  --  85  BILITOT 0.3  --   --  0.3 0.3  --  0.6  PROT 6.9  --   --  6.7 6.4*  --  6.6  ALBUMIN 1.7*   < > 1.9* 1.7* 1.7* 1.6* 1.7*   < > = values in this interval not displayed.    Acute metabolic encephalopathy Mental status continues to wax and wane - has undergone extensive work-up - MRI brain 12/21 without acute findings and repeat 1/15 again without acute findings  - EEG only noted slowing consistent with encephalopathy - Neurology felt most consistent with acute delirium of hospitalization  B6 deficiency Supplementation ongoing   Folic acid deficiency Supplementation ongoing   Hypercalcemia Hydrate - recheck in AM   Severe protein calorie malnutrition  Family Communication: no family present at time of visit   Objective: Blood pressure 123/75, pulse 87, temperature 98.6 F (37 C), temperature source Oral, resp. rate 16, height 5\' 10"  (1.778 m), weight 73.3 kg, SpO2 99 %.  Intake/Output Summary (Last 24 hours) at 01/09/2022 0824 Last data filed at 01/09/2022 0600 Gross per 24 hour  Intake 3002.53 ml  Output 2250 ml  Net 752.53 ml    Filed Weights   12/17/21 0500 12/24/21 0500 12/30/21 0635  Weight: 76.5 kg 77.9 kg 73.3 kg    Examination: General: No acute respiratory distress Lungs: Clear to auscultation bilaterally  Cardiovascular: Regular rate and rhythm - no M  Abdom: soft, ostomy benign, no rebound, bs hypoactive  Ext: no signif edema B LE   CBC: Recent Labs  Lab 01/05/22 0800 01/06/22 0347 01/08/22 0338  WBC 7.7 7.7 7.7  NEUTROABS 5.5 5.5 5.1  HGB 7.5* 7.5* 8.3*  HCT 25.4* 25.4* 27.4*  MCV 92.0 91.4 88.1  PLT 266 280 947    Basic Metabolic Panel: Recent Labs  Lab  01/06/22 0347 01/07/22 0418 01/08/22 0338  NA 141 137 135  K 3.9 4.0 3.6  CL 114* 110 109  CO2 24 24 23   GLUCOSE 135* 155* 92  BUN 24* 19 19  CREATININE <0.30* 0.32* 0.56  CALCIUM 9.1 9.4 10.0  MG 2.2 1.8 2.2  PHOS 2.8 3.1 3.4    GFR: Estimated Creatinine Clearance: 73.8 mL/min (by C-G formula based on SCr of 0.56 mg/dL).  Liver Function Tests: Recent Labs  Lab 01/02/22 2144 01/03/22 0445 01/05/22 0800 01/06/22 0347 01/07/22 0418 01/08/22 0338  AST 17  --  19 24  --  120*  ALT 17  --  18 22  --  88*  ALKPHOS 83  --  74 69  --  85  BILITOT 0.3  --  0.3 0.3  --  0.6  PROT 6.9  --  6.7 6.4*  --  6.6  ALBUMIN 1.7*   < > 1.7* 1.7*  1.6* 1.7*   < > = values in this interval not displayed.     Recent Labs  Lab 01/02/22 2145  AMMONIA 15     CBG: Recent Labs  Lab 01/08/22 0727 01/08/22 1117 01/08/22 1635 01/08/22 1940 01/09/22 0123  GLUCAP 107* 117* 97 88 87     Recent Results (from the past 240 hour(s))  Urine Culture     Status: Abnormal   Collection Time: 12/31/21 10:48 AM   Specimen: In/Out Cath Urine  Result Value Ref Range Status   Specimen Description   Final    IN/OUT CATH URINE Performed at [facility], [place] [address], [place], [place] [address]    Special Requests   Final    NONE Performed at [facility], [place] [address], [place], [place] [address]    Culture >=100,000 COLONIES/mL ENTEROCOCCUS FAECALIS (A)  Final   Report Status 01/02/2022 FINAL  Final   Organism ID, Bacteria ENTEROCOCCUS FAECALIS (A)  Final      Susceptibility   Enterococcus faecalis - MIC*    AMPICILLIN <=2 SENSITIVE Sensitive     NITROFURANTOIN <=16 SENSITIVE Sensitive     VANCOMYCIN 1 SENSITIVE Sensitive     * >=100,000 COLONIES/mL ENTEROCOCCUS FAECALIS      Scheduled Meds:  acetaminophen (TYLENOL) oral liquid 160 mg/5 mL  1,000 mg Oral Q6H   chlorhexidine  15 mL Mouth Rinse BID   Chlorhexidine Gluconate Cloth  6 each Topical Daily   enoxaparin (LOVENOX) injection  40 mg Subcutaneous Q24H   feeding supplement  237 mL Oral TID BM   ferrous sulfate  325 mg Oral BID WC   folic acid  1 mg Oral Daily   insulin aspart  0-9 Units Subcutaneous 4 times per day   mouth rinse  15 mL Mouth Rinse q12n4p   mirtazapine  15 mg Oral QHS   multivitamin with minerals  1 tablet Oral Daily   niacin  100 mg Oral QHS   nutrition supplement (JUVEN)  1 packet Oral BID BM   pantoprazole  40 mg Oral QHS   psyllium  1 packet Oral BID   vitamin B-6  100 mg Oral Daily   sodium chloride flush  10-40 mL Intracatheter Q12H   thiamine  100 mg Oral BID   Continuous Infusions:  sodium chloride  100 mL/hr at 01/09/22 0009   piperacillin-tazobactam 3.375 g (01/09/22 0515)     LOS: [place] days   Cherene Altes, MD [facility] Office  [phone] Pager - Text  Page per Shea Evans  If 7PM-7AM, please contact night-coverage per Amion 01/09/2022, 8:24 AM

## 2022-01-09 NOTE — Progress Notes (Signed)
Nutrition Follow-up  DOCUMENTATION CODES:   Severe malnutrition in context of acute illness/injury  INTERVENTION:   If PEG placed: -Will be at refeeding syndrome risk -Initiate Osmolite 1.5 @ 20 ml/hr and advance by 10 ml every 12 hours to goal rate of 60 ml/hr. -provides 2160 kcals, 90g protein and 1097 ml H2O -Will need free water flushes of 200 ml every 6 hours (800 ml)  -If pt to remain full code and PEG is not placed: need to resume TPN. Pt is severely malnourished, has nutritional deficiencies and at risk for more. Unable to meet her nutritional needs via PO which has been shown via multiple calorie counts and per reports from staff of pt pocketing food and refusing feeding assistance.  -Ensure Plus High Protein po TID, each supplement provides 350 kcal and 20 grams of protein.   -1 packet Juven BID, each packet provides 95 calories, 2.5 grams of protein (collagen)  NUTRITION DIAGNOSIS:   Severe Malnutrition related to acute illness as evidenced by energy intake < or equal to 50% for > or equal to 5 days, moderate fat depletion, severe muscle depletion.  Ongoing.  GOAL:   Patient will meet greater than or equal to 90% of their needs  Not meeting.  MONITOR:   PO intake, Supplement acceptance, Labs, Weight trends, I & O's (TPN)  ASSESSMENT:   66 year old female who presents to the emergency department with sudden onset of abdominal pain and distention.CT scan of abdomen and pelvis shows findings consistent with free air with a large pneumoperitoneum.  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  Patient in room, no family present at bedside.  Pt reporting she has not had anything today. Per nurse tech who later came into room, states she drank an orange juice. Didn't eat any breakfast this morning.  Pt seems to like sipping on Juven supplements but has not been drinking Ensure.   Vitamin lab results: copper WNL, Vitamin B6 low, thiamine  -elevated, Vitamin D WNL, Vitamin E still pending  Noted Dr. Hassell Done discontinued TPN on 1/17. This is in hopes this would stimulate appetite and increase ability for pt to d/c to SNF.  Pt now not getting any nutrition. Pt at risk of further malnutrition and nutrient deficiencies.  Noted conversations between surgery and family. Sister supposed to visit pt tomorrow.   Recommend if pt to remain full code and PEG not agreed to be placed, then she needs TPN. Pt will not meet her nutritional needs via PO shown by multiple calorie counts.  Admission weight: 176 lbs. Last recorded weight 1/9: 161 lbs.  Medications: Ferrous sulfate, Folic acid, Remeron, Multivitamin with minerals daily, niacin, Metamucil, Vitamin B-6, thiamine  Labs reviewed:  CBGs: 86-117  Diet Order:   Diet Order             DIET DYS 2 Room service appropriate? No; Fluid consistency: Thin  Diet effective now                   EDUCATION NEEDS:   Education needs have been addressed  Skin:  Skin Assessment: Skin Integrity Issues: Skin Integrity Issues:: Incisions, Other (Comment) Incisions: 11/27 abdomen Other: left thigh pressure injury  Last BM:  1/19 -ileostomy  Height:   Ht Readings from Last 1 Encounters:  11/17/21 5\' 10"  (1.778 m)    Weight:   Wt Readings from Last 1 Encounters:  12/30/21 73.3 kg    BMI:  Body mass index is  23.19 kg/m.  Estimated Nutritional Needs:   Kcal:  1950-2150  Protein:  95-105g  Fluid:  2L/day  Clayton Bibles, MS, RD, LDN Inpatient Clinical Dietitian Contact information available via Amion

## 2022-01-09 NOTE — Progress Notes (Signed)
Progress Note  53 Days Post-Op  Subjective: Pt resting comfortably in bed.  Still not eating.  Follows some commands today, but not all.  Is very weak in her extremities.  Unable to tell me the names of her sisters today, but could tell me her brother's names.  Objective: Vital signs in last 24 hours: Temp:  [97.3 F (36.3 C)-98.6 F (37 C)] 98.6 F (37 C) (01/19 0520) Pulse Rate:  [87-99] 87 (01/19 0520) Resp:  [15-20] 16 (01/19 0520) BP: (97-123)/(61-75) 123/75 (01/19 0520) SpO2:  [96 %-99 %] 99 % (01/19 0520) Last BM Date:  (colostomy)  Intake/Output from previous day: 01/18 0701 - 01/19 0700 In: 3002.5 [P.O.:540; I.V.:2312.6; IV Piggyback:149.9] Out: 2250 [Urine:1765; Stool:485] Intake/Output this shift: Total I/O In: 118 [P.O.:118] Out: -   PE: General: resting, NAD Resp: normal work of breathing Abdomen: soft, nondistended, nontender to palpation. Midline incision with less drainage and healing well.. Ileostomy productive. 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 Neuro: follows some commands, but so weak she can't plantarflex her feet.  Has normal sensation in her feet and hands.   Lab Results:  Recent Labs    01/08/22 0338  WBC 7.7  HGB 8.3*  HCT 27.4*  PLT 288   BMET Recent Labs    01/07/22 0418 01/08/22 0338  NA 137 135  K 4.0 3.6  CL 110 109  CO2 24 23  GLUCOSE 155* 92  BUN 19 19  CREATININE 0.32* 0.56  CALCIUM 9.4 10.0   PT/INR No results for input(s): LABPROT, INR in the last 72 hours. CMP     Component Value Date/Time   NA 135 01/08/2022 0338   K 3.6 01/08/2022 0338   CL 109 01/08/2022 0338   CO2 23 01/08/2022 0338   GLUCOSE 92 01/08/2022 0338   BUN 19 01/08/2022 0338   CREATININE 0.56 01/08/2022 0338   CALCIUM 10.0 01/08/2022 0338   CALCIUM 10.9 (H) 01/01/2022 0449   PROT 6.6 01/08/2022 0338   ALBUMIN 1.7 (L) 01/08/2022 0338   AST 120 (H) 01/08/2022 0338   ALT 88 (H) 01/08/2022 0338   ALKPHOS 85 01/08/2022 0338    BILITOT 0.6 01/08/2022 0338   GFRNONAA >60 01/08/2022 0338   Lipase     Component Value Date/Time   LIPASE 22 11/17/2021 0955       Studies/Results: No results found.  Anti-infectives: Anti-infectives (From admission, onward)    Start     Dose/Rate Route Frequency Ordered Stop   01/01/22 2100  piperacillin-tazobactam (ZOSYN) IVPB 3.375 g        3.375 g 12.5 mL/hr over 240 Minutes Intravenous Every 8 hours 01/01/22 2042     11/25/21 1000  piperacillin-tazobactam (ZOSYN) IVPB 3.375 g  Status:  Discontinued        3.375 g 12.5 mL/hr over 240 Minutes Intravenous Every 8 hours 11/25/21 0909 12/01/21 0822   11/17/21 1830  piperacillin-tazobactam (ZOSYN) IVPB 3.375 g        3.375 g 12.5 mL/hr over 240 Minutes Intravenous Every 8 hours 11/17/21 1415 11/22/21 2359   11/17/21 1230  piperacillin-tazobactam (ZOSYN) IVPB 3.375 g        3.375 g 100 mL/hr over 30 Minutes Intravenous  Once 11/17/21 1220 11/17/21 1304        Assessment/Plan 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 Harlow Asa 11/27 - path: necrotic colon at site of perforation, no malignancy or dysplasia  noted  - Ileostomy output improved. Continue BID metamucil.  - WOC following for ileostomy, mucous fistula - Wound with blue-green, musty smelling drainage 1/11 - Dakin's TID x72 hrs. Wound probed and deeper tract opened after CT 1/11 with return of purulent drainage.   - upon DC of Dakin's would like TID dry dressing to help absorb drainage  - Pathology benign. CT distal descending/proximal sigmoid bowel luminal collapse. - CT 1/11 - some extravasated oral contrast in the subcutaneous abdominal wall superior and lateral to the ostomy at this level. There is inflammatory tissue at the level of the ostomy extending superiorly abutting the inferior tip of the liver and fundus of the gallbladder similar to the prior examination. There is likely  some early fluid collection development anterior to the right kidney image 7/31 measuring 1.9 by 1.4 by 3.7 cm. Previously described fluid collections near the ostomy are not as well defined on the current study. 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 There is no free intraperitoneal air or ascites. Small air-fluid collection at the level of the umbilicus is again seen containing air. This collection measures 2.5 x 0.7 by 1.3 cm and has mildly increased in size. Air in the collection is new from prior. - no collection around the ileostomy that is drainable - the best way to try to resolve this is abx and improving nutrition -? Need to rescan since patient can't really tell us how she feels.  Her WBC is normal and AF, which is generally clinical signs to not rescan.  Will d/w MD. -Dr. Hassell Done has stopped her TNA yesterday 1/17, no oral intake really yet.   FEN - Dysphagia 2 diet/needs g-tube but family refusing currently   VTE - SCDs, Lovenox ID - zosyn 11/27>12/2, 12/5>12/11, restarted 1/11>> likely 7 -10 days Foley - None currently    - encephalopathy/FTT - appreciate consultation at request of Dr. Johnnye Sima. Neurology saw a few weeks ago and signed off. On folic acid and thiamine supplementation. Family requesting further neurologic testing - appreciate Neuro seeing again. Appreciate TRH following as well.   - psych saw 12/27 and determined patient does not have capacity   - frequent urination - Urine Cx with enterococcus faecalis sensitive to ampicillin, Zosyn should be adequate coverage   Dispo: Patient  needs SNF, but can't go with TNA.  Family refusing g-tube.  Unclear end point.     LOS: 53 days   I reviewed nursing notes, hospitalist notes, last 24 h vitals and pain scores, last 48 h intake and output, and last 24 h labs and trends.    Henreitta Cea, [facility]  Surgery 01/09/2022, 10:20 AM Please see Amion for pager number during day hours 7:00am-4:30pm

## 2022-01-10 LAB — PHOSPHORUS: Phosphorus: 2.2 mg/dL — ABNORMAL LOW (ref 2.5–4.6)

## 2022-01-10 LAB — COMPREHENSIVE METABOLIC PANEL
ALT: 165 U/L — ABNORMAL HIGH (ref 0–44)
AST: 212 U/L — ABNORMAL HIGH (ref 15–41)
Albumin: 1.6 g/dL — ABNORMAL LOW (ref 3.5–5.0)
Alkaline Phosphatase: 113 U/L (ref 38–126)
Anion gap: 4 — ABNORMAL LOW (ref 5–15)
BUN: 10 mg/dL (ref 8–23)
CO2: 21 mmol/L — ABNORMAL LOW (ref 22–32)
Calcium: 8.7 mg/dL — ABNORMAL LOW (ref 8.9–10.3)
Chloride: 111 mmol/L (ref 98–111)
Creatinine, Ser: 0.51 mg/dL (ref 0.44–1.00)
GFR, Estimated: >60 mL/min (ref >60)
Glucose, Bld: 81 mg/dL (ref 70–99)
Potassium: 2.9 mmol/L — ABNORMAL LOW (ref 3.5–5.1)
Sodium: 136 mmol/L (ref 135–145)
Total Bilirubin: 0.4 mg/dL (ref 0.3–1.2)
Total Protein: 6.3 g/dL — ABNORMAL LOW (ref 6.5–8.1)

## 2022-01-10 LAB — CBC
HCT: 26.5 % — ABNORMAL LOW (ref 36.0–46.0)
Hemoglobin: 7.9 g/dL — ABNORMAL LOW (ref 12.0–15.0)
MCH: 26.2 pg (ref 26.0–34.0)
MCHC: 29.8 g/dL — ABNORMAL LOW (ref 30.0–36.0)
MCV: 87.7 fL (ref 80.0–100.0)
Platelets: 258 10*3/uL (ref 150–400)
RBC: 3.02 MIL/uL — ABNORMAL LOW (ref 3.87–5.11)
RDW: 17.7 % — ABNORMAL HIGH (ref 11.5–15.5)
WBC: 4.8 10*3/uL (ref 4.0–10.5)
nRBC: 0 % (ref 0.0–0.2)

## 2022-01-10 LAB — VITAMIN E
Vitamin E (Alpha Tocopherol): 10.8 mg/L (ref 9.0–29.0)
Vitamin E(Gamma Tocopherol): 0.7 mg/L (ref 0.5–4.9)

## 2022-01-10 LAB — MAGNESIUM: Magnesium: 1.7 mg/dL (ref 1.7–2.4)

## 2022-01-10 MED ORDER — IBUPROFEN 100 MG/5ML PO SUSP
400.0000 mg | Freq: Four times a day (QID) | ORAL | Status: DC | PRN
  Administered 2022-01-14 – 2022-01-16 (×2): 400 mg via ORAL
  Filled 2022-01-10 (×6): qty 20

## 2022-01-10 MED ORDER — MAGNESIUM SULFATE 2 GM/50ML IV SOLN
2.0000 g | Freq: Once | INTRAVENOUS | Status: AC
  Administered 2022-01-10: 2 g via INTRAVENOUS
  Filled 2022-01-10: qty 50

## 2022-01-10 MED ORDER — POTASSIUM CHLORIDE 10 MEQ/100ML IV SOLN
10.0000 meq | INTRAVENOUS | Status: AC
  Administered 2022-01-10 (×4): 10 meq via INTRAVENOUS
  Filled 2022-01-10 (×4): qty 100

## 2022-01-10 MED ORDER — KCL IN DEXTROSE-NACL 20-5-0.9 MEQ/L-%-% IV SOLN
INTRAVENOUS | Status: DC
  Filled 2022-01-10 (×20): qty 1000

## 2022-01-10 NOTE — Consult Note (Signed)
CONSULT F/U NOTE  Sherry Hampton  UUV:[id number] DOB: 11-May-1956 DOA: 11/17/2021 PCP: Pcp, No    Brief Narrative:  65yo previously independently functional patient with a history of HLD and some memory loss who presented 11/27 with the acute onset of abdominal pain.  She required emergent surgery for a perforated viscus of the ascending colon with fecal peritonitis and required right colectomy with ileostomy.  Her hospital stay has been prolonged and complicated by fluid collections in the abdomen, malnutrition, progressive decline, and encephalopathy.  TRH is following for assistance with general medical management.  Code Status: FULL CODE  DVT prophylaxis: Lovenox  Interim Hx: Remains afebrile.  Blood pressure stable.  Saturation 100% on room air.  Potassium low at 2.9 today.  Magnesium marginal at 1.7. Alert and conversant today, but confused. Does not appear to be uncomfortable or distressed. Can't tell me where she is or why she is here.   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 Care per primary service / General Surgery  Enterococcus UTI - resolved  Has completed a course of antibiotic for this indication  Mild transaminitis Monitor trend - minimize meds which are affected or effecting   Recent Labs  Lab 01/05/22 0800 01/06/22 0347 01/07/22 0418 01/08/22 0338 01/10/22 0421  AST 19 24  --  120* 212*  ALT 18 22  --  88* 165*  ALKPHOS 74 69  --  85 113  BILITOT 0.3 0.3  --  0.6 0.4  PROT 6.7 6.4*  --  6.6 6.3*  ALBUMIN 1.7* 1.7* 1.6* 1.7* 1.6*    Acute metabolic encephalopathy Mental status continues to wax and wane - has undergone extensive work-up - MRI brain 12/21 without acute findings and repeat 1/15 again without acute findings - EEG only noted slowing consistent with encephalopathy - Neurology felt most consistent with acute delirium of hospitalization  B6  deficiency Supplementation ongoing   Folic acid deficiency Supplementation ongoing   Hypercalcemia Calcium corrects to approximately 10.6 today - no specific treatment indicated  Hypokalemia Correct magnesium and potassium - due to extremely limited intake  Hypomagnesemia Supplement -due to extremely limited intake  Severe protein calorie malnutrition  Family Communication: no family present at time of visit   Objective: Blood pressure 114/72, pulse 87, temperature 98.4 F (36.9 C), temperature source Oral, resp. rate 17, height 5\' 10"  (1.778 m), weight 73.3 kg, SpO2 100 %.  Intake/Output Summary (Last 24 hours) at 01/10/2022 0809 Last data filed at 01/10/2022 0600 Gross per 24 hour  Intake 3018.42 ml  Output 2800 ml  Net 218.42 ml    Filed Weights   12/17/21 0500 12/24/21 0500 12/30/21 0635  Weight: 76.5 kg 77.9 kg 73.3 kg    Examination: General: No acute respiratory distress Lungs: Clear to auscultation bilaterally  Cardiovascular: Regular rate and rhythm - no M  Abdom: soft, ostomy benign, no rebound, bs present  Ext: no signif edema B LE   CBC: Recent Labs  Lab 01/05/22 0800 01/06/22 0347 01/08/22 0338 01/10/22 0421  WBC 7.7 7.7 7.7 4.8  NEUTROABS 5.5 5.5 5.1  --   HGB 7.5* 7.5* 8.3* 7.9*  HCT 25.4* 25.4* 27.4* 26.5*  MCV 92.0 91.4 88.1 87.7  PLT 266 280 288 474    Basic Metabolic Panel: Recent Labs  Lab 01/07/22 0418 01/08/22 0338 01/10/22 0421  NA 137 135 136  K 4.0 3.6 2.9*  CL 110 109 111  CO2 24 23 21*  GLUCOSE 155* 92 81  BUN 19 19 10   CREATININE 0.32* 0.56 0.51  CALCIUM 9.4 10.0 8.7*  MG 1.8 2.2 1.7  PHOS 3.1 3.4 2.2*    GFR: Estimated Creatinine Clearance: 73.8 mL/min (by C-G formula based on SCr of 0.51 mg/dL).  Liver Function Tests: Recent Labs  Lab 01/05/22 0800 01/06/22 0347 01/07/22 0418 01/08/22 0338 01/10/22 0421  AST 19 24  --  120* 212*  ALT 18 22  --  88* 165*  ALKPHOS 74 69  --  85 113  BILITOT 0.3 0.3  --   0.6 0.4  PROT 6.7 6.4*  --  6.6 6.3*  ALBUMIN 1.7* 1.7* 1.6* 1.7* 1.6*      CBG: Recent Labs  Lab 01/08/22 1635 01/08/22 1940 01/09/22 0123 01/09/22 0844 01/09/22 1120  GLUCAP 97 88 87 86 96     Recent Results (from the past 240 hour(s))  Urine Culture     Status: Abnormal   Collection Time: 12/31/21 10:48 AM   Specimen: In/Out Cath Urine  Result Value Ref Range Status   Specimen Description   Final    IN/OUT CATH URINE Performed at [facility], [place] [address], [place], [place] [address]    Special Requests   Final    NONE Performed at [facility], [place] [address] [place], [place] [address]    Culture >=100,000 COLONIES/mL ENTEROCOCCUS FAECALIS (A)  Final   Report Status 01/02/2022 FINAL  Final   Organism ID, Bacteria ENTEROCOCCUS FAECALIS (A)  Final      Susceptibility   Enterococcus faecalis - MIC*    AMPICILLIN <=2 SENSITIVE Sensitive     NITROFURANTOIN <=16 SENSITIVE Sensitive     VANCOMYCIN 1 SENSITIVE Sensitive     * >=100,000 COLONIES/mL ENTEROCOCCUS FAECALIS      Scheduled Meds:  acetaminophen (TYLENOL) oral liquid 160 mg/5 mL  1,000 mg Oral Q6H   chlorhexidine  15 mL Mouth Rinse BID   Chlorhexidine Gluconate Cloth  6 each Topical Daily   enoxaparin (LOVENOX) injection  40 mg Subcutaneous Q24H   feeding supplement  237 mL Oral TID BM   ferrous sulfate  325 mg Oral BID WC   folic acid  1 mg Oral Daily   mouth rinse  15 mL Mouth Rinse q12n4p   multivitamin with minerals  1 tablet Oral Daily   niacin  100 mg Oral QHS   nutrition supplement (JUVEN)  1 packet Oral BID BM   pantoprazole  40 mg Oral QHS   psyllium  1 packet Oral BID   vitamin B-6  100 mg Oral Daily   sodium chloride flush  10-40 mL Intracatheter Q12H   thiamine  100 mg Oral BID   Continuous Infusions:  sodium chloride 100 mL/hr at 01/09/22 2101   piperacillin-tazobactam 3.375 g (01/10/22 0523)     LOS: 54 days   Cherene Altes,  MD [facility] Office  [phone] Pager - Text Page per Amion  If 7PM-7AM, please contact night-coverage per Amion 01/10/2022, 8:09 AM

## 2022-01-10 NOTE — Ethics Note (Addendum)
°  Case was reviewed at [place] on 01-08-2022  As a committee recommendation again is to coordinate as a multidisciplinary treatment team.  Make all attempts to coordinate family meeting.  At  family meeting establish all reasonably available adult siblings willing to participate in decision-making for patient.    All attempts should be made to have majority of patient's reasonably available adult siblings weigh on specific decisions being presented from the attending team regarding treatment plan.  Secure names and contact for all family members for ongoing communication.   [place]'s right to natural death statute, there is a pecking order for who the legal surrogate decision maker should be when the patient lacks capacity.  Clearly this patient has family and some family member or group of family members who are  appropriate surrogate decision makers.   Fine print of the statute  states that surrogate decision makers must be reasonably available.    If as a group and majority rules,  or there is no individual willing to accept responsibility as decision-maker for this patient, ultimately decisions made fall to the physician.    Decision making person or persons must be reasonably available.   If treatment team is unsuccessful in securing treatment plan for this patient, members of the ethics committee can serve as mediator between treatment team and family.  No charge   Wadie Lessen NP  Ethic Committee Member

## 2022-01-10 NOTE — Progress Notes (Signed)
Progress Note  54 Days Post-Op  Subjective: Pt resting comfortably in bed.  Still not eating.  Attempted to feed her today myself and she wouldn't take food or drink.  She couldn't answer my questions today on recalling family member's names etc.  Objective: Vital signs in last 24 hours: Temp:  [97.9 F (36.6 C)-98.7 F (37.1 C)] 98.4 F (36.9 C) (01/20 0441) Pulse Rate:  [83-87] 87 (01/20 0441) Resp:  [16-17] 17 (01/20 0441) BP: (114-124)/(72-92) 114/72 (01/20 0441) SpO2:  [98 %-100 %] 98 % (01/20 1100) Last BM Date:  (colostomy)  Intake/Output from previous day: 01/19 0701 - 01/20 0700 In: 3018.4 [P.O.:518; I.V.:2349.2; IV Piggyback:151.2] Out: 2800 [Urine:1700; Stool:1100] Intake/Output this shift: Total I/O In: 510.6 [P.O.:60; I.V.:351.8; IV Piggyback:98.8] Out: 450 [Urine:300; Stool:150]  PE: General: resting, NAD Resp: normal work of breathing Abdomen: soft, nondistended, nontender to palpation. Midline incision with less drainage and healing well.. Ileostomy productive. 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 Neuro: follows some commands, but really just won't participate in exam at all.   Lab Results:  Recent Labs    01/08/22 0338 01/10/22 0421  WBC 7.7 4.8  HGB 8.3* 7.9*  HCT 27.4* 26.5*  PLT 288 258   BMET Recent Labs    01/08/22 0338 01/10/22 0421  NA 135 136  K 3.6 2.9*  CL 109 111  CO2 23 21*  GLUCOSE 92 81  BUN 19 10  CREATININE 0.56 0.51  CALCIUM 10.0 8.7*   PT/INR No results for input(s): LABPROT, INR in the last 72 hours. CMP     Component Value Date/Time   NA 136 01/10/2022 0421   K 2.9 (L) 01/10/2022 0421   CL 111 01/10/2022 0421   CO2 21 (L) 01/10/2022 0421   GLUCOSE 81 01/10/2022 0421   BUN 10 01/10/2022 0421   CREATININE 0.51 01/10/2022 0421   CALCIUM 8.7 (L) 01/10/2022 0421   CALCIUM 10.9 (H) 01/01/2022 0449   PROT 6.3 (L) 01/10/2022 0421   ALBUMIN 1.6 (L) 01/10/2022 0421   AST 212 (H) 01/10/2022 0421   ALT  165 (H) 01/10/2022 0421   ALKPHOS 113 01/10/2022 0421   BILITOT 0.4 01/10/2022 0421   GFRNONAA >60 01/10/2022 0421   Lipase     Component Value Date/Time   LIPASE 22 11/17/2021 0955       Studies/Results: No results found.  Anti-infectives: Anti-infectives (From admission, onward)    Start     Dose/Rate Route Frequency Ordered Stop   01/01/22 2100  piperacillin-tazobactam (ZOSYN) IVPB 3.375 g        3.375 g 12.5 mL/hr over 240 Minutes Intravenous Every 8 hours 01/01/22 2042     11/25/21 1000  piperacillin-tazobactam (ZOSYN) IVPB 3.375 g  Status:  Discontinued        3.375 g 12.5 mL/hr over 240 Minutes Intravenous Every 8 hours 11/25/21 0909 12/01/21 0822   11/17/21 1830  piperacillin-tazobactam (ZOSYN) IVPB 3.375 g        3.375 g 12.5 mL/hr over 240 Minutes Intravenous Every 8 hours 11/17/21 1415 11/22/21 2359   11/17/21 1230  piperacillin-tazobactam (ZOSYN) IVPB 3.375 g        3.375 g 100 mL/hr over 30 Minutes Intravenous  Once 11/17/21 1220 11/17/21 1304        Assessment/Plan 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 Harlow Asa 11/27 - path: necrotic colon at site of perforation, no malignancy or dysplasia  noted  - Ileostomy output improved. Continue BID metamucil.  - WOC following for ileostomy, mucous fistula - Wound with blue-green, musty smelling drainage 1/11 - Dakin's TID x72 hrs. Wound probed and deeper tract opened after CT 1/11 with return of purulent drainage.   - upon DC of Dakin's would like TID dry dressing to help absorb drainage  - Pathology benign. CT distal descending/proximal sigmoid bowel luminal collapse. - CT 1/11 - some extravasated oral contrast in the subcutaneous abdominal wall superior and lateral to the ostomy at this level. There is inflammatory tissue at the level of the ostomy extending superiorly abutting the inferior tip of the liver and fundus of the  gallbladder similar to the prior examination. There is likely some early fluid collection development anterior to the right kidney image 7/31 measuring 1.9 by 1.4 by 3.7 cm. Previously described fluid collections near the ostomy are not as well defined on the current study. 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 There is no free intraperitoneal air or ascites. Small air-fluid collection at the level of the umbilicus is again seen containing air. This collection measures 2.5 x 0.7 by 1.3 cm and has mildly increased in size. Air in the collection is new from prior. - no collection around the ileostomy that is drainable - the best way to try to resolve this is abx and improving nutrition -D10 of zosyn, will stop today and follow CBC, repeat scan if goes back up. -Dr. Hassell Done has stopped her TNA yesterday 1/17, no oral intake really yet. -sister, [name] supposedly coming down today   FEN - Dysphagia 2 diet/needs g-tube but family refusing currently   VTE - SCDs, Lovenox ID - zosyn 11/27>12/2, 12/5>12/11, restarted 1/11>> 1/20 Foley - None currently    - encephalopathy/FTT - appreciate consultation at request of Dr. Johnnye Sima. Neurology saw a few weeks ago and signed off. On folic acid and thiamine supplementation. Family requesting further neurologic testing - appreciate Neuro seeing again. Appreciate TRH following as well.   - psych saw 12/27 and determined patient does not have capacity   - frequent urination - Urine Cx with enterococcus faecalis sensitive to ampicillin, Zosyn should be adequate coverage   Dispo: Patient  needs SNF, but can't go with TNA.  Family refusing g-tube.  Unclear end point.     LOS: 54 days   I reviewed nursing notes, hospitalist notes, last 24 h vitals and pain scores, last 48 h intake and output, and last 24 h labs and trends.    Henreitta Cea, [facility] Surgery 01/10/2022, 1:29 PM Please see Amion for pager number during day hours 7:00am-4:30pm

## 2022-01-11 LAB — CBC
HCT: 25.6 % — ABNORMAL LOW (ref 36.0–46.0)
Hemoglobin: 7.8 g/dL — ABNORMAL LOW (ref 12.0–15.0)
MCH: 27 pg (ref 26.0–34.0)
MCHC: 30.5 g/dL (ref 30.0–36.0)
MCV: 88.6 fL (ref 80.0–100.0)
Platelets: 238 10*3/uL (ref 150–400)
RBC: 2.89 MIL/uL — ABNORMAL LOW (ref 3.87–5.11)
RDW: 18 % — ABNORMAL HIGH (ref 11.5–15.5)
WBC: 4.5 10*3/uL (ref 4.0–10.5)
nRBC: 0 % (ref 0.0–0.2)

## 2022-01-11 LAB — COMPREHENSIVE METABOLIC PANEL
ALT: 130 U/L — ABNORMAL HIGH (ref 0–44)
AST: 125 U/L — ABNORMAL HIGH (ref 15–41)
Albumin: <1.5 g/dL — ABNORMAL LOW (ref 3.5–5.0)
Alkaline Phosphatase: 122 U/L (ref 38–126)
Anion gap: 4 — ABNORMAL LOW (ref 5–15)
BUN: 7 mg/dL — ABNORMAL LOW (ref 8–23)
CO2: 21 mmol/L — ABNORMAL LOW (ref 22–32)
Calcium: 8.8 mg/dL — ABNORMAL LOW (ref 8.9–10.3)
Chloride: 112 mmol/L — ABNORMAL HIGH (ref 98–111)
Creatinine, Ser: 0.31 mg/dL — ABNORMAL LOW (ref 0.44–1.00)
GFR, Estimated: >60 mL/min (ref >60)
Glucose, Bld: 107 mg/dL — ABNORMAL HIGH (ref 70–99)
Potassium: 3.6 mmol/L (ref 3.5–5.1)
Sodium: 137 mmol/L (ref 135–145)
Total Bilirubin: 0.3 mg/dL (ref 0.3–1.2)
Total Protein: 6 g/dL — ABNORMAL LOW (ref 6.5–8.1)

## 2022-01-11 LAB — MAGNESIUM: Magnesium: 1.9 mg/dL (ref 1.7–2.4)

## 2022-01-11 NOTE — Consult Note (Signed)
CONSULT F/U NOTE  Sherry Hampton  BHA:[id number] DOB: [birth year] DOA: 11/17/2021 PCP: Pcp, No    Brief Narrative:  66yo previously independently functional patient with a history of HLD and some memory loss who presented 11/27 with the acute onset of abdominal pain.  She required emergent surgery for a perforated viscus of the ascending colon with fecal peritonitis and required right colectomy with ileostomy.  Her hospital stay has been prolonged and complicated by fluid collections in the abdomen, malnutrition, progressive decline, and encephalopathy.  TRH is following for assistance with general medical management.  Code Status: FULL CODE  DVT prophylaxis: Lovenox  Interim Hx: No acute events reported overnight.  Afebrile.  Vital signs stable.  Potassium improved with supplementation.  Magnesium improved with supplementation.  LFTs continue to improve.  Hemoglobin stable. TRH continues to follow from a consultative standpoint. Nothing new to contribute to care today.   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 Care per primary service / General Surgery  Enterococcus UTI - resolved  Has completed a course of antibiotic for this indication  Mild transaminitis minimized meds which could contribute - improving  Recent Labs  Lab 01/05/22 0800 01/06/22 0347 01/07/22 0418 01/08/22 0338 01/10/22 0421 01/11/22 0314  AST 19 24  --  120* 212* 125*  ALT 18 22  --  88* 165* 130*  ALKPHOS 74 69  --  85 113 122  BILITOT 0.3 0.3  --  0.6 0.4 0.3  PROT 6.7 6.4*  --  6.6 6.3* 6.0*  ALBUMIN 1.7* 1.7* 1.6* 1.7* 1.6* <1.5*    Acute metabolic encephalopathy Mental status continues to wax and wane - has undergone extensive work-up - MRI brain 12/21 without acute findings and repeat 1/15 again without acute findings - EEG only noted slowing consistent with encephalopathy - Neurology felt most consistent  with acute delirium of hospitalization  B6 deficiency Supplementation ongoing   Folic acid deficiency Supplementation ongoing   Hypercalcemia Calcium now corrects to approximately 10.6 today - no specific treatment indicated  Hypokalemia due to extremely limited intake -improved with supplementation  Hypomagnesemia due to extremely limited intake -improved with supplementation  Severe protein calorie malnutrition  Family Communication:   Objective: Blood pressure 115/87, pulse 83, temperature 98.1 F (36.7 C), temperature source Oral, resp. rate 18, height 5\' 10"  (1.778 m), weight 73.3 kg, SpO2 99 %.  Intake/Output Summary (Last 24 hours) at 01/11/2022 0914 Last data filed at 01/11/2022 0708 Gross per 24 hour  Intake 2459.31 ml  Output 1850 ml  Net 609.31 ml    Filed Weights   12/17/21 0500 12/24/21 0500 12/30/21 0635  Weight: 76.5 kg 77.9 kg 73.3 kg    Examination: No exam today   CBC: Recent Labs  Lab 01/05/22 0800 01/06/22 0347 01/08/22 0338 01/10/22 0421 01/11/22 0314  WBC 7.7 7.7 7.7 4.8 4.5  NEUTROABS 5.5 5.5 5.1  --   --   HGB 7.5* 7.5* 8.3* 7.9* 7.8*  HCT 25.4* 25.4* 27.4* 26.5* 25.6*  MCV 92.0 91.4 88.1 87.7 88.6  PLT 266 280 288 258 973    Basic Metabolic Panel: Recent Labs  Lab 01/07/22 0418 01/08/22 0338 01/10/22 0421 01/11/22 0314  NA 137 135 136 137  K 4.0 3.6 2.9* 3.6  CL 110 109 111 112*  CO2 24 23 21* 21*  GLUCOSE 155* 92 81 107*  BUN 19 19 10  7*  CREATININE 0.32* 0.56 0.51  0.31*  CALCIUM 9.4 10.0 8.7* 8.8*  MG 1.8 2.2 1.7 1.9  PHOS 3.1 3.4 2.2*  --     GFR: Estimated Creatinine Clearance: 73.8 mL/min (A) (by C-G formula based on SCr of 0.31 mg/dL (L)).  Liver Function Tests: Recent Labs  Lab 01/06/22 0347 01/07/22 0418 01/08/22 0338 01/10/22 0421 01/11/22 0314  AST 24  --  120* 212* 125*  ALT 22  --  88* 165* 130*  ALKPHOS 69  --  85 113 122  BILITOT 0.3  --  0.6 0.4 0.3  PROT 6.4*  --  6.6 6.3* 6.0*  ALBUMIN  1.7* 1.6* 1.7* 1.6* <1.5*      CBG: Recent Labs  Lab 01/08/22 1635 01/08/22 1940 01/09/22 0123 01/09/22 0844 01/09/22 1120  GLUCAP 97 88 87 86 96     Scheduled Meds:  chlorhexidine  15 mL Mouth Rinse BID   Chlorhexidine Gluconate Cloth  6 each Topical Daily   enoxaparin (LOVENOX) injection  40 mg Subcutaneous Q24H   feeding supplement  237 mL Oral TID BM   ferrous sulfate  325 mg Oral BID WC   folic acid  1 mg Oral Daily   mouth rinse  15 mL Mouth Rinse q12n4p   multivitamin with minerals  1 tablet Oral Daily   niacin  100 mg Oral QHS   nutrition supplement (JUVEN)  1 packet Oral BID BM   pantoprazole  40 mg Oral QHS   psyllium  1 packet Oral BID   vitamin B-6  100 mg Oral Daily   sodium chloride flush  10-40 mL Intracatheter Q12H   thiamine  100 mg Oral BID   Continuous Infusions:  dextrose 5 % and 0.9 % NaCl with KCl 20 mEq/L 75 mL/hr at 01/11/22 0600     LOS: 55 days   Cherene Altes, MD [facility] Office  [phone] Pager - Text Page per Shea Evans  If 7PM-7AM, please contact night-coverage per Amion 01/11/2022, 9:14 AM

## 2022-01-11 NOTE — Progress Notes (Signed)
55 Days Post-Op   Subjective/Chief Complaint: No complaints. Ate some yesterday   Objective: Vital signs in last 24 hours: Temp:  [97.8 F (36.6 C)-98.8 F (37.1 C)] 98.1 F (36.7 C) (01/21 0828) Pulse Rate:  [78-85] 83 (01/21 0828) Resp:  [18] 18 (01/21 0828) BP: (106-115)/(73-87) 115/87 (01/21 0828) SpO2:  [98 %-100 %] 99 % (01/21 0828) Last BM Date:  (colostomy)  Intake/Output from previous day: 01/20 0701 - 01/21 0700 In: 2399.3 [P.O.:360; I.V.:1540.6; IV Piggyback:498.7] Out: 1550 [Urine:950; Stool:600] Intake/Output this shift: Total I/O In: 60 [P.O.:60] Out: 500 [Urine:400; Stool:100]  General appearance: alert, cachectic, and slowed mentation Resp: clear to auscultation bilaterally Cardio: regular rate and rhythm GI: soft, minimal tenderness. Ostomy productive  Lab Results:  Recent Labs    01/10/22 0421 01/11/22 0314  WBC 4.8 4.5  HGB 7.9* 7.8*  HCT 26.5* 25.6*  PLT 258 238   BMET Recent Labs    01/10/22 0421 01/11/22 0314  NA 136 137  K 2.9* 3.6  CL 111 112*  CO2 21* 21*  GLUCOSE 81 107*  BUN 10 7*  CREATININE 0.51 0.31*  CALCIUM 8.7* 8.8*   PT/INR No results for input(s): LABPROT, INR in the last 72 hours. ABG No results for input(s): PHART, HCO3 in the last 72 hours.  Invalid input(s): PCO2, PO2  Studies/Results: No results found.  Anti-infectives: Anti-infectives (From admission, onward)    Start     Dose/Rate Route Frequency Ordered Stop   01/01/22 2100  piperacillin-tazobactam (ZOSYN) IVPB 3.375 g  Status:  Discontinued        3.375 g 12.5 mL/hr over 240 Minutes Intravenous Every 8 hours 01/01/22 2042 01/10/22 1333   11/25/21 1000  piperacillin-tazobactam (ZOSYN) IVPB 3.375 g  Status:  Discontinued        3.375 g 12.5 mL/hr over 240 Minutes Intravenous Every 8 hours 11/25/21 0909 12/01/21 0822   11/17/21 1830  piperacillin-tazobactam (ZOSYN) IVPB 3.375 g        3.375 g 12.5 mL/hr over 240 Minutes Intravenous Every 8 hours  11/17/21 1415 11/22/21 2359   11/17/21 1230  piperacillin-tazobactam (ZOSYN) IVPB 3.375 g        3.375 g 100 mL/hr over 30 Minutes Intravenous  Once 11/17/21 1220 11/17/21 1304       Assessment/Plan: s/p Procedure(s): EXPLORATORY LAPAROTOMY, EXTENDED RIGHT COLECTOMY, END ILEOSTOMY, MUCOUS FISTULA (N/A) PARTIAL COLECTOMY (N/A) Advance diet 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 Harlow Asa 11/27 - path: necrotic colon at site of perforation, no malignancy or dysplasia noted  - Ileostomy output improved. Continue BID metamucil.  - WOC following for ileostomy, mucous fistula - Wound with blue-green, musty smelling drainage 1/11 - Dakin's TID x72 hrs. Wound probed and deeper tract opened after CT 1/11 with return of purulent drainage.   - upon DC of Dakin's would like TID dry dressing to help absorb drainage  - Pathology benign. CT distal descending/proximal sigmoid bowel luminal collapse. - CT 1/11 - some extravasated oral contrast in the subcutaneous abdominal wall superior and lateral to the ostomy at this level. There is inflammatory tissue at the level of the ostomy extending superiorly abutting the inferior tip of the liver and fundus of the gallbladder similar to the prior examination. There is likely some early fluid collection development anterior to the right kidney image 7/31 measuring 1.9 by 1.4 by 3.7 cm. Previously described fluid collections near the ostomy are not as well  defined on the current study. 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 There is no free intraperitoneal air or ascites. Small air-fluid collection at the level of the umbilicus is again seen containing air. This collection measures 2.5 x 0.7 by 1.3 cm and has mildly increased in size. Air in the collection is new from prior. - no  collection around the ileostomy that is drainable - the best way to try to resolve this is abx and improving nutrition -D10 of zosyn, will stop today and follow CBC, repeat scan if goes back up. -Dr. Hassell Done has stopped her TNA yesterday 1/17, no oral intake really yet. -sister, [name] supposedly coming down today   FEN - Dysphagia 2 diet/needs g-tube but family refusing currently   VTE - SCDs, Lovenox ID - zosyn 11/27>12/2, 12/5>12/11, restarted 1/11>> 1/20 Foley - None currently    - encephalopathy/FTT - appreciate consultation at request of Dr. Johnnye Sima. Neurology saw a few weeks ago and signed off. On folic acid and thiamine supplementation. Family requesting further neurologic testing - appreciate Neuro seeing again. Appreciate TRH following as well.   - psych saw 12/27 and determined patient does not have capacity   - frequent urination - Urine Cx with enterococcus faecalis sensitive to ampicillin, Zosyn should be adequate coverage   Dispo: Patient  needs SNF, but can't go with TNA.  Family is now thinking about g tube  LOS: 55 days    Sherry Hampton 01/11/2022

## 2022-01-12 MED ORDER — ALTEPLASE 2 MG IJ SOLR
2.0000 mg | Freq: Once | INTRAMUSCULAR | Status: AC | PRN
  Administered 2022-01-12: 2 mg
  Filled 2022-01-12 (×2): qty 2

## 2022-01-12 NOTE — Consult Note (Signed)
CONSULT F/U NOTE  Sherry Hampton  YHC:[id number] DOB: 12-May-1956 DOA: 11/17/2021 PCP: Pcp, No    Brief Narrative:  65yo previously independently functional patient with a history of HLD and some memory loss who presented 11/27 with the acute onset of abdominal pain.  She required emergent surgery for a perforated viscus of the ascending colon with fecal peritonitis and required right colectomy with ileostomy.  Her hospital stay has been prolonged and complicated by fluid collections in the abdomen, malnutrition, progressive decline, and encephalopathy.  TRH is following for assistance with general medical management.  Code Status: FULL CODE  DVT prophylaxis: Lovenox  Interim Hx: Chart reviewed.  Afebrile.  Vital stable.  No acute events reported overnight.  No new labs today.  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 Care per primary service / General Surgery  Enterococcus UTI - resolved  Has completed a course of antibiotic for this indication  Mild transaminitis minimized meds which could contribute - improving  Recent Labs  Lab 01/06/22 0347 01/07/22 0418 01/08/22 0338 01/10/22 0421 01/11/22 0314  AST 24  --  120* 212* 125*  ALT 22  --  88* 165* 130*  ALKPHOS 69  --  85 113 122  BILITOT 0.3  --  0.6 0.4 0.3  PROT 6.4*  --  6.6 6.3* 6.0*  ALBUMIN 1.7* 1.6* 1.7* 1.6* <1.5*    Acute metabolic encephalopathy Mental status continues to wax and wane - has undergone extensive work-up - MRI brain 12/21 without acute findings and repeat 1/15 again without acute findings - EEG only noted slowing consistent with encephalopathy - Neurology felt most consistent with acute delirium of hospitalization  B6 deficiency Supplementation ongoing   Folic acid deficiency Supplementation ongoing   Hypercalcemia Calcium now corrects to approximately 10.6 today - no specific treatment  indicated  Hypokalemia due to extremely limited intake -improved with supplementation  Hypomagnesemia due to extremely limited intake -improved with supplementation  Severe protein calorie malnutrition  Family Communication:   Objective: Blood pressure 126/72, pulse 94, temperature 98.9 F (37.2 C), temperature source Oral, resp. rate 16, height 5\' 10"  (1.778 m), weight 73.3 kg, SpO2 97 %.  Intake/Output Summary (Last 24 hours) at 01/12/2022 0825 Last data filed at 01/12/2022 0600 Gross per 24 hour  Intake 1736.26 ml  Output 1150 ml  Net 586.26 ml    Filed Weights   12/17/21 0500 12/24/21 0500 12/30/21 0635  Weight: 76.5 kg 77.9 kg 73.3 kg    Examination: No exam today   CBC: Recent Labs  Lab 01/06/22 0347 01/08/22 0338 01/10/22 0421 01/11/22 0314  WBC 7.7 7.7 4.8 4.5  NEUTROABS 5.5 5.1  --   --   HGB 7.5* 8.3* 7.9* 7.8*  HCT 25.4* 27.4* 26.5* 25.6*  MCV 91.4 88.1 87.7 88.6  PLT 280 288 258 517    Basic Metabolic Panel: Recent Labs  Lab 01/07/22 0418 01/08/22 0338 01/10/22 0421 01/11/22 0314  NA 137 135 136 137  K 4.0 3.6 2.9* 3.6  CL 110 109 111 112*  CO2 24 23 21* 21*  GLUCOSE 155* 92 81 107*  BUN 19 19 10  7*  CREATININE 0.32* 0.56 0.51 0.31*  CALCIUM 9.4 10.0 8.7* 8.8*  MG 1.8 2.2 1.7 1.9  PHOS 3.1 3.4 2.2*  --     GFR: Estimated Creatinine Clearance: 73.8 mL/min (A) (by C-G formula based on SCr of 0.31 mg/dL (L)).  Liver  Function Tests: Recent Labs  Lab 01/06/22 0347 01/07/22 0418 01/08/22 0338 01/10/22 0421 01/11/22 0314  AST 24  --  120* 212* 125*  ALT 22  --  88* 165* 130*  ALKPHOS 69  --  85 113 122  BILITOT 0.3  --  0.6 0.4 0.3  PROT 6.4*  --  6.6 6.3* 6.0*  ALBUMIN 1.7* 1.6* 1.7* 1.6* <1.5*     CBG: Recent Labs  Lab 01/08/22 1635 01/08/22 1940 01/09/22 0123 01/09/22 0844 01/09/22 1120  GLUCAP 97 88 87 86 96     Scheduled Meds:  chlorhexidine  15 mL Mouth Rinse BID   Chlorhexidine Gluconate Cloth  6 each  Topical Daily   enoxaparin (LOVENOX) injection  40 mg Subcutaneous Q24H   feeding supplement  237 mL Oral TID BM   ferrous sulfate  325 mg Oral BID WC   folic acid  1 mg Oral Daily   mouth rinse  15 mL Mouth Rinse q12n4p   multivitamin with minerals  1 tablet Oral Daily   niacin  100 mg Oral QHS   nutrition supplement (JUVEN)  1 packet Oral BID BM   pantoprazole  40 mg Oral QHS   psyllium  1 packet Oral BID   vitamin B-6  100 mg Oral Daily   sodium chloride flush  10-40 mL Intracatheter Q12H   thiamine  100 mg Oral BID   Continuous Infusions:  dextrose 5 % and 0.9 % NaCl with KCl 20 mEq/L 60 mL/hr at 01/11/22 1357     LOS: 56 days   Cherene Altes, MD [facility] Office  [phone] Pager - Text Page per Shea Evans  If 7PM-7AM, please contact night-coverage per Amion 01/12/2022, 8:25 AM

## 2022-01-12 NOTE — Progress Notes (Signed)
56 Days Post-Op   Subjective/Chief Complaint: No complaints. Family wants to take her to [place]   Objective: Vital signs in last 24 hours: Temp:  [98.1 F (36.7 C)-98.9 F (37.2 C)] 98.9 F (37.2 C) (01/22 0410) Pulse Rate:  [90-100] 94 (01/22 0410) Resp:  [16-18] 16 (01/22 0410) BP: (126-144)/(72-90) 126/72 (01/22 0410) SpO2:  [97 %-99 %] 97 % (01/22 0410) Last BM Date: 01/11/22  Intake/Output from previous day: 01/21 0701 - 01/22 0700 In: 1796.3 [P.O.:420; I.V.:1376.3] Out: 1450 [Urine:1150; Stool:300] Intake/Output this shift: No intake/output data recorded.  General appearance: alert and cachectic Resp: clear to auscultation bilaterally Cardio: regular rate and rhythm GI: soft, minimal tenderness. Ostomy productive  Lab Results:  Recent Labs    01/10/22 0421 01/11/22 0314  WBC 4.8 4.5  HGB 7.9* 7.8*  HCT 26.5* 25.6*  PLT 258 238   BMET Recent Labs    01/10/22 0421 01/11/22 0314  NA 136 137  K 2.9* 3.6  CL 111 112*  CO2 21* 21*  GLUCOSE 81 107*  BUN 10 7*  CREATININE 0.51 0.31*  CALCIUM 8.7* 8.8*   PT/INR No results for input(s): LABPROT, INR in the last 72 hours. ABG No results for input(s): PHART, HCO3 in the last 72 hours.  Invalid input(s): PCO2, PO2  Studies/Results: No results found.  Anti-infectives: Anti-infectives (From admission, onward)    Start     Dose/Rate Route Frequency Ordered Stop   01/01/22 2100  piperacillin-tazobactam (ZOSYN) IVPB 3.375 g  Status:  Discontinued        3.375 g 12.5 mL/hr over 240 Minutes Intravenous Every 8 hours 01/01/22 2042 01/10/22 1333   11/25/21 1000  piperacillin-tazobactam (ZOSYN) IVPB 3.375 g  Status:  Discontinued        3.375 g 12.5 mL/hr over 240 Minutes Intravenous Every 8 hours 11/25/21 0909 12/01/21 0822   11/17/21 1830  piperacillin-tazobactam (ZOSYN) IVPB 3.375 g        3.375 g 12.5 mL/hr over 240 Minutes Intravenous Every 8 hours 11/17/21 1415 11/22/21 2359   11/17/21 1230   piperacillin-tazobactam (ZOSYN) IVPB 3.375 g        3.375 g 100 mL/hr over 30 Minutes Intravenous  Once 11/17/21 1220 11/17/21 1304       Assessment/Plan: s/p Procedure(s): EXPLORATORY LAPAROTOMY, EXTENDED RIGHT COLECTOMY, END ILEOSTOMY, MUCOUS FISTULA (N/A) PARTIAL COLECTOMY (N/A) Advance diet 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 Harlow Asa 11/27 - path: necrotic colon at site of perforation, no malignancy or dysplasia noted  - Ileostomy output improved. Continue BID metamucil.  - WOC following for ileostomy, mucous fistula - Wound with blue-green, musty smelling drainage 1/11 - Dakin's TID x72 hrs. Wound probed and deeper tract opened after CT 1/11 with return of purulent drainage.   - upon DC of Dakin's would like TID dry dressing to help absorb drainage  - Pathology benign. CT distal descending/proximal sigmoid bowel luminal collapse. - CT 1/11 - some extravasated oral contrast in the subcutaneous abdominal wall superior and lateral to the ostomy at this level. There is inflammatory tissue at the level of the ostomy extending superiorly abutting the inferior tip of the liver and fundus of the gallbladder similar to the prior examination. There is likely some early fluid collection development anterior to the right kidney image 7/31 measuring 1.9 by 1.4 by 3.7 cm. Previously described fluid collections near the ostomy are not as well defined on the current study. Ill-defined  omental inflammation and fluid in the lower abdomen and left upper quadrant appears unchanged and may be related to omental infarct. There is no free intraperitoneal air or ascites. Small air-fluid collection at the level of the umbilicus is again seen containing air. This collection measures 2.5 x 0.7 by 1.3 cm and has mildly increased in size. Air in the collection is new from prior. - no collection around the ileostomy that is  drainable - the best way to try to resolve this is abx and improving nutrition -D10 of zosyn, will stop today and follow CBC, repeat scan if goes back up. -Dr. Hassell Done has stopped her TNA yesterday 1/17, no oral intake really yet. -sister, [name] supposedly coming down today   FEN - Dysphagia 2 diet/needs g-tube but family refusing currently   VTE - SCDs, Lovenox ID - zosyn 11/27>12/2, 12/5>12/11, restarted 1/11>> 1/20 Foley - None currently    - encephalopathy/FTT - appreciate consultation at request of Dr. Johnnye Sima. Neurology saw a few weeks ago and signed off. On folic acid and thiamine supplementation. Family requesting further neurologic testing - appreciate Neuro seeing again. Appreciate TRH following as well.   - psych saw 12/27 and determined patient does not have capacity   - frequent urination - Urine Cx with enterococcus faecalis sensitive to ampicillin, Zosyn should be adequate coverage   Dispo: Patient  needs SNF, but can't go with TNA.  Family is now thinking about g tube  LOS: 56 days    Autumn Messing III 01/12/2022

## 2022-01-13 LAB — COMPREHENSIVE METABOLIC PANEL
ALT: 66 U/L — ABNORMAL HIGH (ref 0–44)
AST: 32 U/L (ref 15–41)
Albumin: 1.6 g/dL — ABNORMAL LOW (ref 3.5–5.0)
Alkaline Phosphatase: 109 U/L (ref 38–126)
Anion gap: <3 — ABNORMAL LOW (ref 5–15)
BUN: 5 mg/dL — ABNORMAL LOW (ref 8–23)
CO2: 24 mmol/L (ref 22–32)
Calcium: 9.1 mg/dL (ref 8.9–10.3)
Chloride: 109 mmol/L (ref 98–111)
Creatinine, Ser: 0.38 mg/dL — ABNORMAL LOW (ref 0.44–1.00)
GFR, Estimated: >60 mL/min (ref >60)
Glucose, Bld: 106 mg/dL — ABNORMAL HIGH (ref 70–99)
Potassium: 3.3 mmol/L — ABNORMAL LOW (ref 3.5–5.1)
Sodium: 134 mmol/L — ABNORMAL LOW (ref 135–145)
Total Bilirubin: 0.5 mg/dL (ref 0.3–1.2)
Total Protein: 6.6 g/dL (ref 6.5–8.1)

## 2022-01-13 LAB — PTH-RELATED PEPTIDE: PTH-related peptide: <2 pmol/L

## 2022-01-13 MED ORDER — POTASSIUM CHLORIDE 20 MEQ PO PACK
20.0000 meq | PACK | Freq: Two times a day (BID) | ORAL | Status: DC
  Administered 2022-01-13 – 2022-01-20 (×10): 20 meq via ORAL
  Filled 2022-01-13 (×17): qty 1

## 2022-01-13 NOTE — Progress Notes (Signed)
Progress Note  57 Days Post-Op  Subjective: Pt resting comfortably in bed.  Still not eating.  Sister, [name] at bedside today.  Asking if her brother and her don't agree on patient's care if "you all medical people" can just make the decision on the patient's care ourselves.  Sister had multiple questions about her care.  A lot of our conversation was allowing the patient's sister to vent her concerns and frustrations about her situation and her care throughout her stay.   Objective: Vital signs in last 24 hours: Temp:  [97.7 F (36.5 C)-100 F (37.8 C)] 98.6 F (37 C) (01/23 0203) Pulse Rate:  [81-90] 81 (01/23 0203) Resp:  [14-18] 18 (01/23 0203) BP: (111-127)/(67-78) 111/77 (01/23 0203) SpO2:  [98 %-100 %] 98 % (01/23 0203) Last BM Date: 01/12/22  Intake/Output from previous day: 01/22 0701 - 01/23 0700 In: 1133.6 [P.O.:60; I.V.:1073.6] Out: 2395 [Urine:1815; Stool:580] Intake/Output this shift: Total I/O In: 30 [P.O.:30] Out: 300 [Urine:300]  PE: General: resting, NAD Resp: normal work of breathing Abdomen: soft, nondistended, nontender to palpation. Midline incision with no drainage and healing well. Ileostomy productive. Mucous fistula with scant output in bag Neuro: follows some commands, but really just won't participate in exam at all.   Lab Results:  Recent Labs    01/11/22 0314  WBC 4.5  HGB 7.8*  HCT 25.6*  PLT 238   BMET Recent Labs    01/11/22 0314 01/13/22 0352  NA 137 134*  K 3.6 3.3*  CL 112* 109  CO2 21* 24  GLUCOSE 107* 106*  BUN 7* 5*  CREATININE 0.31* 0.38*  CALCIUM 8.8* 9.1   PT/INR No results for input(s): LABPROT, INR in the last 72 hours. CMP     Component Value Date/Time   NA 134 (L) 01/13/2022 0352   K 3.3 (L) 01/13/2022 0352   CL 109 01/13/2022 0352   CO2 24 01/13/2022 0352   GLUCOSE 106 (H) 01/13/2022 0352   BUN 5 (L) 01/13/2022 0352   CREATININE 0.38 (L) 01/13/2022 0352   CALCIUM 9.1 01/13/2022 0352   CALCIUM 10.9  (H) 01/01/2022 0449   PROT 6.6 01/13/2022 0352   ALBUMIN 1.6 (L) 01/13/2022 0352   AST 32 01/13/2022 0352   ALT 66 (H) 01/13/2022 0352   ALKPHOS 109 01/13/2022 0352   BILITOT 0.5 01/13/2022 0352   GFRNONAA >60 01/13/2022 0352   Lipase     Component Value Date/Time   LIPASE 22 11/17/2021 0955       Studies/Results: No results found.  Anti-infectives: Anti-infectives (From admission, onward)    Start     Dose/Rate Route Frequency Ordered Stop   01/01/22 2100  piperacillin-tazobactam (ZOSYN) IVPB 3.375 g  Status:  Discontinued        3.375 g 12.5 mL/hr over 240 Minutes Intravenous Every 8 hours 01/01/22 2042 01/10/22 1333   11/25/21 1000  piperacillin-tazobactam (ZOSYN) IVPB 3.375 g  Status:  Discontinued        3.375 g 12.5 mL/hr over 240 Minutes Intravenous Every 8 hours 11/25/21 0909 12/01/21 0822   11/17/21 1830  piperacillin-tazobactam (ZOSYN) IVPB 3.375 g        3.375 g 12.5 mL/hr over 240 Minutes Intravenous Every 8 hours 11/17/21 1415 11/22/21 2359   11/17/21 1230  piperacillin-tazobactam (ZOSYN) IVPB 3.375 g        3.375 g 100 mL/hr over 30 Minutes Intravenous  Once 11/17/21 1220 11/17/21 1304        Assessment/Plan S/p extended  right colectomy with 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 Harlow Asa 11/27 - path: necrotic colon at site of perforation, no malignancy or dysplasia noted  - Ileostomy output improved. Continue BID metamucil.  - WOC following for ileostomy, mucous fistula - Wound with blue-green, musty smelling drainage 1/11 - Dakin's TID x72 hrs. Wound probed and deeper tract opened after CT 1/11 with return of purulent drainage.   - upon DC of Dakin's would like TID dry dressing to help absorb drainage  - Pathology benign. CT distal descending/proximal sigmoid bowel luminal collapse. - CT 1/11 - some extravasated oral contrast in the subcutaneous abdominal wall superior and lateral to the ostomy at  this level. There is inflammatory tissue at the level of the ostomy extending superiorly abutting the inferior tip of the liver and fundus of the gallbladder similar to the prior examination. There is likely some early fluid collection development anterior to the right kidney image 7/31 measuring 1.9 by 1.4 by 3.7 cm. Previously described fluid collections near the ostomy are not as well defined on the current study. 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 There is no free intraperitoneal air or ascites. Small air-fluid collection at the level of the umbilicus is again seen containing air. This collection measures 2.5 x 0.7 by 1.3 cm and has mildly increased in size. Air in the collection is new from prior. - no collection around the ileostomy that is drainable - the best way to try to resolve this is abx and improving nutrition -zosyn completed for last CT scan findings and UTI on 1/20 -Dr. Hassell Done stopped her TNA on 1/17, no oral intake really yet. -sister, [name] at bedside today.  I asked her to provide the name and contact number of all 6 siblings   FEN - Dysphagia 2 diet/but won't eat   VTE - SCDs, Lovenox ID - zosyn 11/27>12/2, 12/5>12/11, restarted 1/11>> 1/20 Foley - None currently    - encephalopathy/FTT - appreciate consultation at request of Dr. Johnnye Sima. Neurology saw a few weeks ago and signed off. On folic acid and thiamine supplementation. Family requesting further neurologic testing - appreciate Neuro seeing again. Appreciate TRH following as well.   - psych saw 12/27 and determined patient does not have capacity   - frequent urination - Urine Cx with enterococcus faecalis sensitive to ampicillin, Zosyn completed   Dispo: Patient needs SNF, but can't go with TNA.  Family is apparently not on the same page regarding her care.  Have asked sister to provide contacts for  everyone.   LOS: 57 days   I reviewed nursing notes, hospitalist notes, last 24 h vitals and pain scores, last 48 h intake and output, and last 24 h labs and trends.    Henreitta Cea, [facility] 01/13/2022, 10:25 AM Please see Amion for pager number during day hours 7:00am-4:30pm

## 2022-01-13 NOTE — Consult Note (Signed)
CONSULT F/U NOTE  Sherry Hampton  TGG:[id number] DOB: [birth year] DOA: 11/17/2021 PCP: Pcp, No    Brief Narrative:  65yo previously independently functional patient with a history of HLD and some memory loss who presented 11/27 with the acute onset of abdominal pain.  She required emergent surgery for a perforated viscus of the ascending colon with fecal peritonitis and required right colectomy with ileostomy.  Her hospital stay has been prolonged and complicated by fluid collections in the abdomen, malnutrition, progressive decline, and encephalopathy.  TRH is following for assistance with general medical management.  Code Status: FULL CODE  DVT prophylaxis: Lovenox  Interim Hx: No acute events overnight.  Afebrile.  Saturation 98% on room air.  Modest hypokalemia noted on labs.  Renal function is stable.  BUN not elevated.  LFTs have essentially normalized. Appears more alert today. Answers simple questions, but can't tell me where she is or why she is here.   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 Care per primary service / General Surgery  Enterococcus UTI - resolved  Has completed a course of antibiotic for this indication  Mild transaminitis minimized meds which could contribute - essentially resolved at this time  Recent Labs  Lab 01/07/22 0418 01/08/22 0338 01/10/22 0421 01/11/22 0314 01/13/22 0352  AST  --  120* 212* 125* 32  ALT  --  88* 165* 130* 66*  ALKPHOS  --  85 113 122 109  BILITOT  --  0.6 0.4 0.3 0.5  PROT  --  6.6 6.3* 6.0* 6.6  ALBUMIN 1.6* 1.7* 1.6* <1.5* 1.6*    Acute metabolic encephalopathy Mental status continues to wax and wane - has undergone extensive work-up - MRI brain 12/21 without acute findings and repeat 1/15 again without acute findings - EEG only noted slowing consistent with encephalopathy - Neurology felt most consistent with acute delirium of  hospitalization - if further improvement is to be seen it will not likely occur until after the patient leaves the hospital   B6 deficiency Supplementation ongoing   Folic acid deficiency Supplementation ongoing   Hypercalcemia Calcium now corrects to ~11.3 today - monitor w/o specific intervention for now - awaiting final parathyroid studies - suspect is simply nutritional and volume related  Hypokalemia due to extremely limited intake - continue to supplement  Hypomagnesemia due to extremely limited intake - improved with supplementation  Severe protein calorie malnutrition Primary team has recommended PEG to family, but thus far they have refused   Family Communication: No family present at time of exam  Objective: Blood pressure 111/77, pulse 81, temperature 98.6 F (37 C), temperature source Oral, resp. rate 18, height 5\' 10"  (1.778 m), weight 73.3 kg, SpO2 98 %.  Intake/Output Summary (Last 24 hours) at 01/13/2022 0744 Last data filed at 01/13/2022 0600 Gross per 24 hour  Intake 1133.55 ml  Output 2395 ml  Net -1261.45 ml    Filed Weights   12/17/21 0500 12/24/21 0500 12/30/21 0635  Weight: 76.5 kg 77.9 kg 73.3 kg    Examination: General: No acute respiratory distress Lungs: Clear to auscultation bilaterally without wheezes or crackles Cardiovascular: Regular rate and rhythm  Abdomen: NT/ND, soft, BS+ Extremities: No significant edema bilateral lower extremities   CBC: Recent Labs  Lab 01/08/22 0338 01/10/22 0421 01/11/22 0314  WBC 7.7 4.8 4.5  NEUTROABS 5.1  --   --   HGB 8.3* 7.9* 7.8*  HCT  27.4* 26.5* 25.6*  MCV 88.1 87.7 88.6  PLT 288 258 045    Basic Metabolic Panel: Recent Labs  Lab 01/07/22 0418 01/08/22 0338 01/10/22 0421 01/11/22 0314 01/13/22 0352  NA 137 135 136 137 134*  K 4.0 3.6 2.9* 3.6 3.3*  CL 110 109 111 112* 109  CO2 24 23 21* 21* 24  GLUCOSE 155* 92 81 107* 106*  BUN 19 19 10  7* 5*  CREATININE 0.32* 0.56 0.51 0.31*  0.38*  CALCIUM 9.4 10.0 8.7* 8.8* 9.1  MG 1.8 2.2 1.7 1.9  --   PHOS 3.1 3.4 2.2*  --   --     GFR: Estimated Creatinine Clearance: 73.8 mL/min (A) (by C-G formula based on SCr of 0.38 mg/dL (L)).  Liver Function Tests: Recent Labs  Lab 01/08/22 0338 01/10/22 0421 01/11/22 0314 01/13/22 0352  AST 120* 212* 125* 32  ALT 88* 165* 130* 66*  ALKPHOS 85 113 122 109  BILITOT 0.6 0.4 0.3 0.5  PROT 6.6 6.3* 6.0* 6.6  ALBUMIN 1.7* 1.6* <1.5* 1.6*     Scheduled Meds:  chlorhexidine  15 mL Mouth Rinse BID   Chlorhexidine Gluconate Cloth  6 each Topical Daily   enoxaparin (LOVENOX) injection  40 mg Subcutaneous Q24H   feeding supplement  237 mL Oral TID BM   ferrous sulfate  325 mg Oral BID WC   folic acid  1 mg Oral Daily   mouth rinse  15 mL Mouth Rinse q12n4p   multivitamin with minerals  1 tablet Oral Daily   niacin  100 mg Oral QHS   nutrition supplement (JUVEN)  1 packet Oral BID BM   pantoprazole  40 mg Oral QHS   psyllium  1 packet Oral BID   vitamin B-6  100 mg Oral Daily   sodium chloride flush  10-40 mL Intracatheter Q12H   thiamine  100 mg Oral BID   Continuous Infusions:  dextrose 5 % and 0.9 % NaCl with KCl 20 mEq/L 50 mL/hr at 01/13/22 0601     LOS: [place] days   Cherene Altes, MD [facility] Office  [phone] Pager - Text Page per Shea Evans  If 7PM-7AM, please contact night-coverage per Amion 01/13/2022, 7:44 AM

## 2022-01-14 LAB — BASIC METABOLIC PANEL
Anion gap: 4 — ABNORMAL LOW (ref 5–15)
BUN: 8 mg/dL (ref 8–23)
CO2: 24 mmol/L (ref 22–32)
Calcium: 9.3 mg/dL (ref 8.9–10.3)
Chloride: 107 mmol/L (ref 98–111)
Creatinine, Ser: 0.32 mg/dL — ABNORMAL LOW (ref 0.44–1.00)
GFR, Estimated: >60 mL/min (ref >60)
Glucose, Bld: 119 mg/dL — ABNORMAL HIGH (ref 70–99)
Potassium: 3.5 mmol/L (ref 3.5–5.1)
Sodium: 135 mmol/L (ref 135–145)

## 2022-01-14 MED ORDER — ACETAMINOPHEN 650 MG RE SUPP
325.0000 mg | Freq: Once | RECTAL | Status: AC | PRN
  Administered 2022-01-14: 22:00:00 325 mg via RECTAL
  Filled 2022-01-14: qty 1

## 2022-01-14 MED ORDER — IBUPROFEN 100 MG/5ML PO SUSP
100.0000 mg | Freq: Once | ORAL | Status: DC | PRN
  Filled 2022-01-14: qty 5

## 2022-01-14 NOTE — Progress Notes (Signed)
Progress Note  58 Days Post-Op  Subjective: Pt resting comfortably in bed.  Still not eating.  Sister, [name] at bedside today.    Objective: Vital signs in last 24 hours: Temp:  [97.8 F (36.6 C)-99.1 F (37.3 C)] 99.1 F (37.3 C) (01/24 0522) Pulse Rate:  [86-101] 93 (01/24 0522) Resp:  [16-18] 16 (01/24 0522) BP: (112-118)/(65-70) 112/65 (01/24 0522) SpO2:  [98 %-100 %] 98 % (01/24 0522) Last BM Date: 01/12/22  Intake/Output from previous day: 01/23 0701 - 01/24 0700 In: 2259.1 [P.O.:540; I.V.:1719.1] Out: 3650 [Urine:2700; Stool:950] Intake/Output this shift: No intake/output data recorded.  PE: General: resting, NAD Resp: normal work of breathing Abdomen: soft, nondistended, nontender to palpation. Midline incision with no drainage and healing well. Ileostomy productive. Mucous fistula with scant output in bag  Lab Results:  No results for input(s): WBC, HGB, HCT, PLT in the last 72 hours.  BMET Recent Labs    01/13/22 0352 01/14/22 0423  NA 134* 135  K 3.3* 3.5  CL 109 107  CO2 24 24  GLUCOSE 106* 119*  BUN 5* 8  CREATININE 0.38* 0.32*  CALCIUM 9.1 9.3   PT/INR No results for input(s): LABPROT, INR in the last 72 hours. CMP     Component Value Date/Time   NA 135 01/14/2022 0423   K 3.5 01/14/2022 0423   CL 107 01/14/2022 0423   CO2 24 01/14/2022 0423   GLUCOSE 119 (H) 01/14/2022 0423   BUN 8 01/14/2022 0423   CREATININE 0.32 (L) 01/14/2022 0423   CALCIUM 9.3 01/14/2022 0423   CALCIUM 10.9 (H) 01/01/2022 0449   PROT 6.6 01/13/2022 0352   ALBUMIN 1.6 (L) 01/13/2022 0352   AST 32 01/13/2022 0352   ALT 66 (H) 01/13/2022 0352   ALKPHOS 109 01/13/2022 0352   BILITOT 0.5 01/13/2022 0352   GFRNONAA >60 01/14/2022 0423   Lipase     Component Value Date/Time   LIPASE 22 11/17/2021 0955       Studies/Results: No results found.  Anti-infectives: Anti-infectives (From admission, onward)    Start     Dose/Rate Route Frequency Ordered Stop    01/01/22 2100  piperacillin-tazobactam (ZOSYN) IVPB 3.375 g  Status:  Discontinued        3.375 g 12.5 mL/hr over 240 Minutes Intravenous Every 8 hours 01/01/22 2042 01/10/22 1333   11/25/21 1000  piperacillin-tazobactam (ZOSYN) IVPB 3.375 g  Status:  Discontinued        3.375 g 12.5 mL/hr over 240 Minutes Intravenous Every 8 hours 11/25/21 0909 12/01/21 0822   11/17/21 1830  piperacillin-tazobactam (ZOSYN) IVPB 3.375 g        3.375 g 12.5 mL/hr over 240 Minutes Intravenous Every 8 hours 11/17/21 1415 11/22/21 2359   11/17/21 1230  piperacillin-tazobactam (ZOSYN) IVPB 3.375 g        3.375 g 100 mL/hr over 30 Minutes Intravenous  Once 11/17/21 1220 11/17/21 1304        Assessment/Plan 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 Harlow Asa 11/27 - path: necrotic colon at site of perforation, no malignancy or dysplasia noted  - Ileostomy output improved. Continue BID metamucil.  - WOC following for ileostomy, mucous fistula - Wound with blue-green, musty smelling drainage 1/11 - Dakin's TID x72 hrs. Wound probed and deeper tract opened after CT 1/11 with return of purulent drainage.   - upon DC of Dakin's would like TID dry dressing to  help absorb drainage  - Pathology benign. CT distal descending/proximal sigmoid bowel luminal collapse. - CT 1/11 - some extravasated oral contrast in the subcutaneous abdominal wall superior and lateral to the ostomy at this level. There is inflammatory tissue at the level of the ostomy extending superiorly abutting the inferior tip of the liver and fundus of the gallbladder similar to the prior examination. There is likely some early fluid collection development anterior to the right kidney image 7/31 measuring 1.9 by 1.4 by 3.7 cm. Previously described fluid collections near the ostomy are not as well defined on the current study. Ill-defined omental inflammation and fluid in the  lower abdomen and left upper quadrant appears unchanged and may be related to omental infarct. There is no free intraperitoneal air or ascites. Small air-fluid collection at the level of the umbilicus is again seen containing air. This collection measures 2.5 x 0.7 by 1.3 cm and has mildly increased in size. Air in the collection is new from prior. - no collection around the ileostomy that is drainable - the best way to try to resolve this is abx and improving nutrition -zosyn completed for last CT scan findings and UTI on 1/20 -Dr. Hassell Done stopped her TNA on 1/17, no oral intake really yet. -sister, [name] at bedside today.  I asked her to provide the name and contact number of all 6 siblings.  She states one brother's phone is broke, one wouldn't likely participate.  She hasn't given [place] the names and numbers yet.     FEN - Dysphagia 2 diet/but won't eat   VTE - SCDs, Lovenox ID - zosyn 11/27>12/2, 12/5>12/11, restarted 1/11>> 1/20 Foley - None currently    - encephalopathy/FTT - appreciate consultation at request of Dr. Johnnye Sima. Neurology saw a few weeks ago and signed off. On folic acid and thiamine supplementation. Family requesting further neurologic testing - appreciate Neuro seeing again. Appreciate TRH following as well.   - psych saw 12/27 and determined patient does not have capacity   - frequent urination - Urine Cx with enterococcus faecalis sensitive to ampicillin, Zosyn completed   Dispo: Patient needs SNF, but can't go with TNA.  Family is apparently not on the same page regarding her care.  Have asked sister to provide contacts for everyone.   LOS: 58 days   I reviewed nursing notes, hospitalist notes, last 24 h vitals and pain scores, last 48 h intake and output, and last 24 h labs and trends.    Henreitta Cea, [facility] 01/14/2022, 8:28 AM Please see Amion for pager number during day hours 7:00am-4:30pm

## 2022-01-14 NOTE — Consult Note (Signed)
CONSULT F/U NOTE  Sherry Hampton  XVQ:[id number] DOB: 06-15-1956 DOA: 11/17/2021 PCP: Pcp, No    Brief Narrative:  65yo previously independently functional patient with a history of HLD and some memory loss who presented 11/27 with the acute onset of abdominal pain.  She required emergent surgery for a perforated viscus of the ascending colon with fecal peritonitis and required right colectomy with ileostomy.  Her hospital stay has been prolonged and complicated by fluid collections in the abdomen, malnutrition, progressive decline, and encephalopathy.  TRH is following for assistance with general medical management.  Code Status: FULL CODE  DVT prophylaxis: Lovenox  Interim Hx: T-max 99.1.  Vital signs stable.  Saturation 98% room air.  Electrolytes are balanced overall renal function is normal.  No acute events reported overnight.  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 Care per primary service (General Surgery)  Enterococcus UTI - resolved  Has completed a course of antibiotic for this indication  Mild transaminitis minimized meds which could contribute - essentially resolved at this time  Acute metabolic encephalopathy Mental status continues to wax and wane - has undergone extensive work-up - MRI brain 12/21 without acute findings and repeat 1/15 again without acute findings - EEG only noted slowing consistent with encephalopathy - Neurology felt most consistent with acute delirium of hospitalization - if further improvement is to be seen it will not likely occur until after the patient leaves the hospital   B6 deficiency Supplementation ongoing   Folic acid deficiency Supplementation ongoing   Hypercalcemia Calcium corrects to ~11.3 - monitor w/o specific intervention for now - awaiting final parathyroid studies - may well simply be related to essential bed-bound status    Hypokalemia due to extremely limited intake - continue to supplement  Hypomagnesemia due to extremely limited intake - improved with supplementation  Severe protein calorie malnutrition Primary team has recommended PEG to family, but thus far they have refused   Family Communication:   Objective: Blood pressure 112/65, pulse 93, temperature 99.1 F (37.3 C), resp. rate 16, height 5\' 10"  (1.778 m), weight 73.3 kg, SpO2 98 %.  Intake/Output Summary (Last 24 hours) at 01/14/2022 0744 Last data filed at 01/14/2022 0600 Gross per 24 hour  Intake 2259.06 ml  Output 3650 ml  Net -1390.94 ml    Filed Weights   12/17/21 0500 12/24/21 0500 12/30/21 0635  Weight: 76.5 kg 77.9 kg 73.3 kg    Examination: No exam today - [facility]H will continue to follow along with you, and examine intermittently as needed    CBC: Recent Labs  Lab 01/08/22 0338 01/10/22 0421 01/11/22 0314  WBC 7.7 4.8 4.5  NEUTROABS 5.1  --   --   HGB 8.3* 7.9* 7.8*  HCT 27.4* 26.5* 25.6*  MCV 88.1 87.7 88.6  PLT 288 258 093    Basic Metabolic Panel: Recent Labs  Lab 01/08/22 0338 01/10/22 0421 01/11/22 0314 01/13/22 0352 01/14/22 0423  NA 135 136 137 134* 135  K 3.6 2.9* 3.6 3.3* 3.5  CL 109 111 112* 109 107  CO2 23 21* 21* 24 24  GLUCOSE 92 81 107* 106* 119*  BUN 19 10 7* 5* 8  CREATININE 0.56 0.51 0.31* 0.38* 0.32*  CALCIUM 10.0 8.7* 8.8* 9.1 9.3  MG 2.2 1.7 1.9  --   --   PHOS 3.4 2.2*  --   --   --     GFR:  Estimated Creatinine Clearance: 73.8 mL/min (A) (by C-G formula based on SCr of 0.32 mg/dL (L)).  Liver Function Tests: Recent Labs  Lab 01/08/22 0338 01/10/22 0421 01/11/22 0314 01/13/22 0352  AST 120* 212* 125* 32  ALT 88* 165* 130* 66*  ALKPHOS 85 113 122 109  BILITOT 0.6 0.4 0.3 0.5  PROT 6.6 6.3* 6.0* 6.6  ALBUMIN 1.7* 1.6* <1.5* 1.6*     Scheduled Meds:  chlorhexidine  15 mL Mouth Rinse BID   Chlorhexidine Gluconate Cloth  6 each Topical Daily   enoxaparin (LOVENOX)  injection  40 mg Subcutaneous Q24H   feeding supplement  237 mL Oral TID BM   ferrous sulfate  325 mg Oral BID WC   folic acid  1 mg Oral Daily   mouth rinse  15 mL Mouth Rinse q12n4p   multivitamin with minerals  1 tablet Oral Daily   niacin  100 mg Oral QHS   nutrition supplement (JUVEN)  1 packet Oral BID BM   pantoprazole  40 mg Oral QHS   potassium chloride  20 mEq Oral BID   psyllium  1 packet Oral BID   vitamin B-6  100 mg Oral Daily   sodium chloride flush  10-40 mL Intracatheter Q12H   thiamine  100 mg Oral BID   Continuous Infusions:  dextrose 5 % and 0.9 % NaCl with KCl 20 mEq/L 75 mL/hr at 01/13/22 2040     LOS: 12 days   Cherene Altes, MD [facility] Office  [phone] Pager - Text Page per Shea Evans  If 7PM-7AM, please contact night-coverage per Amion 01/14/2022, 7:44 AM

## 2022-01-15 DIAGNOSIS — K838 Other specified diseases of biliary tract: Secondary | ICD-10-CM

## 2022-01-15 DIAGNOSIS — N390 Urinary tract infection, site not specified: Secondary | ICD-10-CM

## 2022-01-15 DIAGNOSIS — B952 Enterococcus as the cause of diseases classified elsewhere: Secondary | ICD-10-CM

## 2022-01-15 DIAGNOSIS — E538 Deficiency of other specified B group vitamins: Secondary | ICD-10-CM

## 2022-01-15 DIAGNOSIS — E531 Pyridoxine deficiency: Secondary | ICD-10-CM

## 2022-01-15 LAB — CBC
HCT: 26.6 % — ABNORMAL LOW (ref 36.0–46.0)
Hemoglobin: 8 g/dL — ABNORMAL LOW (ref 12.0–15.0)
MCH: 26.3 pg (ref 26.0–34.0)
MCHC: 30.1 g/dL (ref 30.0–36.0)
MCV: 87.5 fL (ref 80.0–100.0)
Platelets: 281 10*3/uL (ref 150–400)
RBC: 3.04 MIL/uL — ABNORMAL LOW (ref 3.87–5.11)
RDW: 18.6 % — ABNORMAL HIGH (ref 11.5–15.5)
WBC: 6.1 10*3/uL (ref 4.0–10.5)
nRBC: 0 % (ref 0.0–0.2)

## 2022-01-15 LAB — COMPREHENSIVE METABOLIC PANEL
ALT: 34 U/L (ref 0–44)
AST: 17 U/L (ref 15–41)
Albumin: <1.5 g/dL — ABNORMAL LOW (ref 3.5–5.0)
Alkaline Phosphatase: 82 U/L (ref 38–126)
Anion gap: 3 — ABNORMAL LOW (ref 5–15)
BUN: 8 mg/dL (ref 8–23)
CO2: 25 mmol/L (ref 22–32)
Calcium: 9.4 mg/dL (ref 8.9–10.3)
Chloride: 107 mmol/L (ref 98–111)
Creatinine, Ser: <0.3 mg/dL — ABNORMAL LOW (ref 0.44–1.00)
Glucose, Bld: 107 mg/dL — ABNORMAL HIGH (ref 70–99)
Potassium: 3.6 mmol/L (ref 3.5–5.1)
Sodium: 135 mmol/L (ref 135–145)
Total Bilirubin: 0.6 mg/dL (ref 0.3–1.2)
Total Protein: 6.7 g/dL (ref 6.5–8.1)

## 2022-01-15 LAB — MAGNESIUM: Magnesium: 1.7 mg/dL (ref 1.7–2.4)

## 2022-01-15 NOTE — TOC Progression Note (Signed)
Transition of Care [facility]) - Progression Note    Patient Details  Name: Sherry Hampton MRN: [id number] Date of Birth: 1956/07/22  Transition of Care [facility]) CM/SW Contact  Lennart Pall, LCSW Phone Number: 01/15/2022, 11:26 AM  Clinical Narrative:    Met with pt's sister this morning to follow up on need for contact information of all family members.  Sister reports that she provided that info this morning to RN (info placed under "media" tab FYI).  Sister asking why "this is now so urgent"? I attempted to refer sister back to discussions medical team has had with her and the attempts made to have decision from family on peg placement.  Unfortunately, the sister then focused on complaints of care and feelings that family is getting poor communication from treatment team.  I explained to sister that I would remove myself from the discussion at that point.  Approximately 45 minutes after this conversation, I received text messages from pt's brother Milbert Coulter: "Good morning Lorre Nick I was just told that you said if we do not agree that the hospital will make my sister comfortable and she would expire can you explain"  "The medical team has my phone number I told them they can text me anytime. Any updates about my sisters Please forward this information" "Again, do we have any updates on my sister"  TOC at a standstill with any dc progression at this point.  Will continue to follow/ monitor and assist once treatment plan decisions are made.  TOC supervisor aware.   Expected Discharge Plan: [place] Barriers to Discharge: Continued Medical Work up, Ship broker, SNF Pending bed offer  Expected Discharge Plan and Services Expected Discharge Plan: [place] In-house Referral: Clinical Social Work     Living arrangements for the past 2 months: Single Family Home                                       Social Determinants of Health (SDOH) Interventions     Readmission Risk Interventions No flowsheet data found.

## 2022-01-15 NOTE — Progress Notes (Signed)
MD paged due to patient having a temp of 101. Patient was unable to tolerate po medications. Low dose of Tylenol was given via rectum. Rechecked patients temp at 2319 99.7. Will continue to monitor.

## 2022-01-15 NOTE — Plan of Care (Signed)
  Problem: Clinical Measurements: Goal: Respiratory complications will improve Outcome: Progressing   Problem: Health Behavior/Discharge Planning: Goal: Ability to manage health-related needs will improve Outcome: Not Progressing   

## 2022-01-15 NOTE — Progress Notes (Signed)
Patients sister [name] gave RN a piece of paper with two of the patients siblings numbers to give to the PA. The patients sister wrote that the others do not have phones to be contacted.

## 2022-01-15 NOTE — Progress Notes (Addendum)
PA asked patients sister [name] for a list of the patients siblings with their numbers. RN has attempted to collect these numbers all day from patients sister to give to the PA but patients sister is refusing.

## 2022-01-15 NOTE — Progress Notes (Signed)
PROGRESS NOTE  Sherry Hampton [id number]:[id number] DOB: 1956-02-03 DOA: 11/17/2021 PCP: Pcp, No   LOS: 36 days   Brief narrative: 66 years old female with history of hyperlipidemia and mild memory loss presented to hospital with acute abnormal pain.  She was noted to have perforated viscus of the ascending colon with fecal peritonitis and required right colectomy and ileostomy.  Hospital course was complicated by fluid collection in the abdomen malnutrition progressive decline and encephalopathy.  TRH was consulted for assistance with general medical management.  Assessment/Plan:  Principal Problem:   Perforated abdominal viscus Active Problems:   GERD (gastroesophageal reflux disease)   Hyperlipidemia   Hyponatremia   Acute blood loss anemia   Acute metabolic encephalopathy   Protein-calorie malnutrition, severe   Delirium   Necrosis of colon with perforation    Ileostomy in place [facility])   Abnormal neurological exam   Memory loss   Hypercalcemia   Folate deficiency   Common bile duct dilatation   Enterococcus UTI   Goals of care, counseling/discussion   Vitamin B6 deficiency   Perforated abdominal viscus  Present on admission.  Status post exploratory laparotomy with left colectomy and end ileostomy.  Primary care team on board.    Enterococcus UTI - resolved  Completed course of antibiotic.   Mild transaminitis Has improved    Acute metabolic encephalopathy Fluctuating mental status.  Extensive work-up done during this hospitalization.  Thought to be secondary to delirium from hospitalization.   MRI brain 12/21 without acute findings and repeat 1/15 again without acute findings.  EEG only noted slowing consistent with encephalopathy.  Neurology was consulted as well during hospitalization.  Continue supportive care.   Vitamin B6 deficiency.  Continue supplementation.   Folic acid deficiency Continue supplementation    Hypercalcemia Calcium level has improved to 9.4  today.  Likely secondary to sedentary status.  PTH related peptide was less than 2.   Hypokalemia Improved after replacement.  Potassium 3.5.   Hypomagnesemia Improved after replacement.  Latest magnesium of 1.7.   Severe protein calorie malnutrition Primary team has recommended PEG to family, but thus far they have refused   DVT prophylaxis: enoxaparin (LOVENOX) injection 40 mg Start: 11/18/21 0800 SCDs Start: 11/17/21 1336   Code Status: Full code  Family Communication: With the patient's sister at bedside.  Status is: Inpatient  Remains inpatient appropriate because: Failure to thrive, poor nutrition, status post colectomy.   Procedures: Status post exploratory laparotomy with right colectomy and ileostomy on 11/17/2021  Anti-infectives:  None  Anti-infectives (From admission, onward)    Start     Dose/Rate Route Frequency Ordered Stop   01/01/22 2100  piperacillin-tazobactam (ZOSYN) IVPB 3.375 g  Status:  Discontinued        3.375 g 12.5 mL/hr over 240 Minutes Intravenous Every 8 hours 01/01/22 2042 01/10/22 1333   11/25/21 1000  piperacillin-tazobactam (ZOSYN) IVPB 3.375 g  Status:  Discontinued        3.375 g 12.5 mL/hr over 240 Minutes Intravenous Every 8 hours 11/25/21 0909 12/01/21 0822   11/17/21 1830  piperacillin-tazobactam (ZOSYN) IVPB 3.375 g        3.375 g 12.5 mL/hr over 240 Minutes Intravenous Every 8 hours 11/17/21 1415 11/22/21 2359   11/17/21 1230  piperacillin-tazobactam (ZOSYN) IVPB 3.375 g        3.375 g 100 mL/hr over 30 Minutes Intravenous  Once 11/17/21 1220 11/17/21 1304       Subjective: Today, patient was seen and examined at bedside.  Spoke with the patient's sister at bedside.  Patient's sister feels frustrated about her sister's condition.  Poor oral intake reported.  Objective: Vitals:   01/15/22 0447 01/15/22 0716  BP: 110/76   Pulse: 80   Resp: 18   Temp: (!) 97.4 F (36.3 C) 97.8 F (36.6 C)  SpO2: 100%      Intake/Output Summary (Last 24 hours) at 01/15/2022 1216 Last data filed at 01/15/2022 1000 Gross per 24 hour  Intake 472.65 ml  Output 1325 ml  Net -852.35 ml   Filed Weights   12/17/21 0500 12/24/21 0500 12/30/21 0635  Weight: 76.5 kg 77.9 kg 73.3 kg   Body mass index is 23.19 kg/m.   Physical Exam: GENERAL: Patient is alert awake, minimally verbal, not in obvious distress. HENT: No scleral pallor or icterus. Pupils equally reactive to light. Oral mucosa is moist NECK: is supple, no gross swelling noted. CHEST:   Diminished breath sounds bilaterally. CVS: S1 and S2 heard, no murmur. Regular rate and rhythm.  ABDOMEN: Midline incision with no drainage.  Ileostomy in place. EXTREMITIES: No edema. CNS: Weak and deconditioned, mildly verbal, SKIN: Abdomen with incision.  Data Review: I have personally reviewed the following laboratory data and studies,  CBC: Recent Labs  Lab 01/10/22 0421 01/11/22 0314 01/15/22 0340  WBC 4.8 4.5 6.1  HGB 7.9* 7.8* 8.0*  HCT 26.5* 25.6* 26.6*  MCV 87.7 88.6 87.5  PLT 258 238 737   Basic Metabolic Panel: Recent Labs  Lab 01/10/22 0421 01/11/22 0314 01/13/22 0352 01/14/22 0423 01/15/22 0340  NA 136 137 134* 135 135  K 2.9* 3.6 3.3* 3.5 3.6  CL 111 112* 109 107 107  CO2 21* 21* 24 24 25   GLUCOSE 81 107* 106* 119* 107*  BUN 10 7* 5* 8 8  CREATININE 0.51 0.31* 0.38* 0.32* <0.30*  CALCIUM 8.7* 8.8* 9.1 9.3 9.4  MG 1.7 1.9  --   --  1.7  PHOS 2.2*  --   --   --   --    Liver Function Tests: Recent Labs  Lab 01/10/22 0421 01/11/22 0314 01/13/22 0352 01/15/22 0340  AST 212* 125* 32 17  ALT 165* 130* 66* 34  ALKPHOS 113 122 109 82  BILITOT 0.4 0.3 0.5 0.6  PROT 6.3* 6.0* 6.6 6.7  ALBUMIN 1.6* <1.5* 1.6* <1.5*   No results for input(s): LIPASE, AMYLASE in the last 168 hours. No results for input(s): AMMONIA in the last 168 hours. Cardiac Enzymes: No results for input(s): CKTOTAL, CKMB, CKMBINDEX, TROPONINI in the last  168 hours. BNP (last 3 results) No results for input(s): BNP in the last 8760 hours.  ProBNP (last 3 results) No results for input(s): PROBNP in the last 8760 hours.  CBG: Recent Labs  Lab 01/08/22 1635 01/08/22 1940 01/09/22 0123 01/09/22 0844 01/09/22 1120  GLUCAP 97 88 87 86 96   No results found for this or any previous visit (from the past 240 hour(s)).   Studies: No results found.    Flora Lipps, MD  [facility] 01/15/2022  If 7PM-7AM, please contact night-coverage

## 2022-01-15 NOTE — Progress Notes (Signed)
This RN spoke with the patient's RN regarding PICC line removal and continued blood draws. In the interest of what is going to be most comfortable for the patient, the nurse is going to consult the provider regarding blood draws/sticks. There seems to be ongoing conversations with the patient's sister about plan of care. Patient's RN will put consult in after speaking with provider.

## 2022-01-15 NOTE — Progress Notes (Addendum)
Progress Note  59 Days Post-Op  Subjective: Pt resting comfortably in bed.  Still not eating.  Sister, [name] at bedside today.    Objective: Vital signs in last 24 hours: Temp:  [97.4 F (36.3 C)-101 F (38.3 C)] 97.8 F (36.6 C) (01/25 0716) Pulse Rate:  [74-86] 80 (01/25 0447) Resp:  [18] 18 (01/25 0447) BP: (110-147)/(72-80) 110/76 (01/25 0447) SpO2:  [98 %-100 %] 100 % (01/25 0447) Last BM Date: 01/12/22  Intake/Output from previous day: 01/24 0701 - 01/25 0700 In: 231.9 [I.V.:231.9] Out: 1450 [Urine:1300; Stool:150] Intake/Output this shift: No intake/output data recorded.  PE: General: resting, NAD, but a bit more vocal when moving her arm around today Resp: normal work of breathing Abdomen: soft, nondistended, nontender to palpation. Midline incision with no drainage and healing well. Ileostomy productive. Mucous fistula with scant output in bag  Lab Results:  Recent Labs    01/15/22 0340  WBC 6.1  HGB 8.0*  HCT 26.6*  PLT 281    BMET Recent Labs    01/14/22 0423 01/15/22 0340  NA 135 135  K 3.5 3.6  CL 107 107  CO2 24 25  GLUCOSE 119* 107*  BUN 8 8  CREATININE 0.32* <0.30*  CALCIUM 9.3 9.4   PT/INR No results for input(s): LABPROT, INR in the last 72 hours. CMP     Component Value Date/Time   NA 135 01/15/2022 0340   K 3.6 01/15/2022 0340   CL 107 01/15/2022 0340   CO2 25 01/15/2022 0340   GLUCOSE 107 (H) 01/15/2022 0340   BUN 8 01/15/2022 0340   CREATININE <0.30 (L) 01/15/2022 0340   CALCIUM 9.4 01/15/2022 0340   CALCIUM 10.9 (H) 01/01/2022 0449   PROT 6.7 01/15/2022 0340   ALBUMIN <1.5 (L) 01/15/2022 0340   AST 17 01/15/2022 0340   ALT 34 01/15/2022 0340   ALKPHOS 82 01/15/2022 0340   BILITOT 0.6 01/15/2022 0340   GFRNONAA NOT CALCULATED 01/15/2022 0340   Lipase     Component Value Date/Time   LIPASE 22 11/17/2021 0955       Studies/Results: No results found.  Anti-infectives: Anti-infectives (From admission,  onward)    Start     Dose/Rate Route Frequency Ordered Stop   01/01/22 2100  piperacillin-tazobactam (ZOSYN) IVPB 3.375 g  Status:  Discontinued        3.375 g 12.5 mL/hr over 240 Minutes Intravenous Every 8 hours 01/01/22 2042 01/10/22 1333   11/25/21 1000  piperacillin-tazobactam (ZOSYN) IVPB 3.375 g  Status:  Discontinued        3.375 g 12.5 mL/hr over 240 Minutes Intravenous Every 8 hours 11/25/21 0909 12/01/21 0822   11/17/21 1830  piperacillin-tazobactam (ZOSYN) IVPB 3.375 g        3.375 g 12.5 mL/hr over 240 Minutes Intravenous Every 8 hours 11/17/21 1415 11/22/21 2359   11/17/21 1230  piperacillin-tazobactam (ZOSYN) IVPB 3.375 g        3.375 g 100 mL/hr over 30 Minutes Intravenous  Once 11/17/21 1220 11/17/21 1304        Assessment/Plan 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 Harlow Asa 11/27 - path: necrotic colon at site of perforation, no malignancy or dysplasia noted  - Ileostomy output improved. Continue BID metamucil.  - WOC following for ileostomy, mucous fistula - Wound with blue-green, musty smelling drainage 1/11 - Dakin's TID x72 hrs. Wound probed and deeper tract opened after CT 1/11  with return of purulent drainage.   - upon DC of Dakin's would like TID dry dressing to help absorb drainage  - Pathology benign. CT distal descending/proximal sigmoid bowel luminal collapse. - CT 1/11 - some extravasated oral contrast in the subcutaneous abdominal wall superior and lateral to the ostomy at this level. There is inflammatory tissue at the level of the ostomy extending superiorly abutting the inferior tip of the liver and fundus of the gallbladder similar to the prior examination. There is likely some early fluid collection development anterior to the right kidney image 7/31 measuring 1.9 by 1.4 by 3.7 cm. Previously described fluid collections near the ostomy are not as well defined on the  current study. 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 There is no free intraperitoneal air or ascites. Small air-fluid collection at the level of the umbilicus is again seen containing air. This collection measures 2.5 x 0.7 by 1.3 cm and has mildly increased in size. Air in the collection is new from prior. - no collection around the ileostomy that is drainable - the best way to try to resolve this is abx and improving nutrition -zosyn completed for last CT scan findings and UTI on 1/20 -Dr. Hassell Done stopped her TNA on 1/17, no oral intake really yet. -sister, [name] at bedside today.  I asked her to provide the name and contact number of all 6 siblings.  She states one brother's phone is broke, one wouldn't likely participate.  Still awaiting this information. -palliative consult placed to help assist [place] in discussions for goals of care with the family as we seem to not be able to make any progress.  -fever overnight of 101.  WBC still normal.  Several etiologies possible.  Will Dc PICC line today given she is not on TNA any longer and insert PIV.  Could recheck UA or CT of A/P.  The biggest issue here is if the family does not want a g-tube and the patient is not going to eat, what gain is there from moving forward with a workup and treatment for someone who is not going to survive without nutrition.  [name] present this morning and we again discussed if there is no desire for a "g-tube in her condition" then we can discuss comfort measures and how to best treat the patient.  She again goes back to confusion on why "we haven't been treating her the whole time she's been here and now you are wanting to discuss keeping her comfortable and not treating her."  We discussed that providers and the family will be unlikely to agree on that topic, but at this point it is in the past and we need to be in agreement  on how to move forward.  She again insisted that "by law, the medical team can make decisions" for the family.  Again, it was reiterated that that is not the case as long as the siblings haven't given up their rights to make a decision for the patient.  Again, I left with no further clear cut path on how the family would like to pursue treatment and care for this patient.  -ADDENDUM: sister provided some names and numbers to the RN and these have been pictured and are in the chart under media   FEN - Dysphagia 2 diet/but won't eat   VTE - SCDs, Lovenox ID - zosyn 11/27>12/2, 12/5>12/11, restarted 1/11>> 1/20  Foley - None currently    - encephalopathy/FTT - appreciate consultation at request of Dr. Johnnye Sima. Neurology saw a few weeks ago and signed off. On folic acid and thiamine supplementation. Family requesting further neurologic testing - appreciate Neuro seeing again. Appreciate [facility] following as well.   - psych saw 12/27 and determined patient does not have capacity   - frequent urination - Urine Cx with enterococcus faecalis sensitive to ampicillin, Zosyn completed   Dispo: unclear at this time   LOS: 59 days   I reviewed nursing notes, hospitalist notes, last 24 h vitals and pain scores, last 48 h intake and output, and last 24 h labs and trends.    Henreitta Cea, [facility] 01/15/2022, 10:02 AM Please see Amion for pager number during day hours 7:00am-4:30pm

## 2022-01-16 ENCOUNTER — Inpatient Hospital Stay (HOSPITAL_COMMUNITY): Payer: Medicare HMO

## 2022-01-16 DIAGNOSIS — D62 Acute posthemorrhagic anemia: Secondary | ICD-10-CM

## 2022-01-16 DIAGNOSIS — Z932 Ileostomy status: Secondary | ICD-10-CM

## 2022-01-16 LAB — URINALYSIS, ROUTINE W REFLEX MICROSCOPIC
Bilirubin Urine: NEGATIVE
Glucose, UA: NEGATIVE mg/dL
Hgb urine dipstick: NEGATIVE
Ketones, ur: NEGATIVE mg/dL
Nitrite: NEGATIVE
Protein, ur: NEGATIVE mg/dL
Specific Gravity, Urine: 1.014 (ref 1.005–1.030)
pH: 6 (ref 5.0–8.0)

## 2022-01-16 LAB — CBC
HCT: 27.2 % — ABNORMAL LOW (ref 36.0–46.0)
Hemoglobin: 8.2 g/dL — ABNORMAL LOW (ref 12.0–15.0)
MCH: 26.5 pg (ref 26.0–34.0)
MCHC: 30.1 g/dL (ref 30.0–36.0)
MCV: 87.7 fL (ref 80.0–100.0)
Platelets: 306 10*3/uL (ref 150–400)
RBC: 3.1 MIL/uL — ABNORMAL LOW (ref 3.87–5.11)
RDW: 18.5 % — ABNORMAL HIGH (ref 11.5–15.5)
WBC: 5.9 10*3/uL (ref 4.0–10.5)
nRBC: 0 % (ref 0.0–0.2)

## 2022-01-16 NOTE — Progress Notes (Signed)
Daily Progress Note   Patient Name: Sherry Hampton       Date: 01/16/2022 DOB: 1956/08/10  Age: 66 y.o. MRN#: [id number] Attending Physician: Nolon Nations, MD Primary Care Physician: Pcp, No Admit Date: 11/17/2021  Reason for Consultation/Follow-up: Establishing goals of care  Subjective: Chart reviewed in detail.  Discussed case with Dr. Harlow Asa as well as Saverio Danker from surgery team.  Discussed with ethics service as well as Osborne Oman from our team who met with family previously.  I saw and examined Sherry Hampton this evening.  She was lying in bed in no distress but remains unable to participate in conversation regarding her situation her goals of care.  I began discussion with her sister, Sherry Hampton, who was at the bedside.  I introduced palliative care as specialized medical care for people living with serious illness. It focuses on providing relief from the symptoms and stress of a serious illness. The goal is to improve quality of life for both the patient and the family.  Discussed Sherry Hampton's concern that her current level of functional status is likely to be her new baseline moving forward.  She states, "just look at her lying there."  I then attempted to transition conversation to focus on domains of her nutrition, cognition, and functional status.  At this point, however, Sherry Hampton indicated she needed to take a phone call that was coming through.  She reports she is agreeable to me following up again tomorrow to continue conversation.  Length of Stay: 60  Current Medications: Scheduled Meds:   chlorhexidine  15 mL Mouth Rinse BID   Chlorhexidine Gluconate Cloth  6 each Topical Daily   enoxaparin (LOVENOX) injection  40 mg Subcutaneous Q24H   feeding supplement  237 mL Oral TID BM    ferrous sulfate  325 mg Oral BID WC   folic acid  1 mg Oral Daily   mouth rinse  15 mL Mouth Rinse q12n4p   multivitamin with minerals  1 tablet Oral Daily   niacin  100 mg Oral QHS   nutrition supplement (JUVEN)  1 packet Oral BID BM   pantoprazole  40 mg Oral QHS   potassium chloride  20 mEq Oral BID   psyllium  1 packet Oral BID   vitamin B-6  100 mg Oral Daily   sodium chloride  flush  10-40 mL Intracatheter Q12H   thiamine  100 mg Oral BID    Continuous Infusions:  dextrose 5 % and 0.9 % NaCl with KCl 20 mEq/L 50 mL/hr at 01/16/22 0336    PRN Meds: ibuprofen, ibuprofen, lip balm, ondansetron **OR** ondansetron (ZOFRAN) IV, oxyCODONE, sodium chloride flush  Physical Exam    General: Alert, awake, in no acute distress.  Confused and not able to participate in conversation HEENT: No bruits, no goiter, no JVD Heart: Regular rate and rhythm. No murmur appreciated. Lungs: Good air movement, clear Abdomen: Soft, nontender, nondistended Skin: Warm and dry  Vital Signs: BP 125/72 (BP Location: Left Arm)    Pulse 98    Temp 99.8 F (37.7 C) (Axillary)    Resp 18    Ht '5\' 10"'  (1.778 m)    Wt 73.3 kg    SpO2 99%    BMI 23.19 kg/m  SpO2: SpO2: 99 % O2 Device: O2 Device: Room Air O2 Flow Rate: O2 Flow Rate (L/min): 2 L/min  Intake/output summary:  Intake/Output Summary (Last 24 hours) at 01/16/2022 3785 Last data filed at 01/16/2022 1800 Gross per 24 hour  Intake 1296.21 ml  Output 1700 ml  Net -403.79 ml   LBM: Last BM Date: 01/12/22 Baseline Weight: Weight: 79.8 kg Most recent weight: Weight: 73.3 kg       Palliative Assessment/Data:      Patient Active Problem List   Diagnosis Date Noted   Goals of care, counseling/discussion 01/07/2022   Vitamin B6 deficiency 01/07/2022   Folate deficiency 01/02/2022   Common bile duct dilatation 01/02/2022   Enterococcus UTI 01/02/2022   Hypercalcemia 12/31/2021   Abnormal neurological exam 12/28/2021   Memory loss 12/28/2021    Necrosis of colon with perforation  12/19/2021   Fatigue 12/19/2021   Joint pain 12/19/2021   Obesity 12/19/2021   Osteoarthritis of knee 12/19/2021   Other long term (current) drug therapy 12/19/2021   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   Delirium    Protein-calorie malnutrition, severe 11/26/2021   GERD (gastroesophageal reflux disease)    Hyperlipidemia    Hyponatremia    Acute blood loss anemia    Acute metabolic encephalopathy    Perforated abdominal viscus 11/17/2021   Fungal infection of toenail 10/18/2015   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   Avitaminosis D 07/10/2015   CN (constipation) 01/11/2015   Hemorrhoids, internal 01/11/2015   Rheumatoid arthritis ([place]) 01/04/2013    Palliative Care Assessment & Plan   Patient Profile: 66 year old female with history of hyperlipidemia and mild memory loss who presented to the hospital with perforated viscus and is status post right colectomy and ileostomy.  She has had a complicated hospital course and at this point is not taking in oral nutrition or hydration.  Was previously on TPN but this was discontinued and family currently being counseled on necessity for G-tube versus recommendation for focusing on her comfort moving forward.  Recommendations/Plan: Full code/full scope Her family is noted to be struggling with surrogate decision making on her behalf.  Palliative engaged earlier this month but was asked to reengage again to further delineate goals in light of the fact that she has no means of meaningful nutrition at this point.  Realistically we are looking at either needing to place a G-tube for enteral nutrition or accepting the fact that she is not taking in enough nutrition and hydration to sustain herself and we are approaching end-of-life if plan is not for artificial nutrition and  hydration. Initial conversation with her sister today.  She requested I come back again tomorrow to continue conversation.  We will plan for  follow-up in a.m.  Goals of Care and Additional Recommendations: Limitations on Scope of Treatment: Full Scope Treatment  Code Status:    Code Status Orders  (From admission, onward)           Start     Ordered   11/17/21 1335  Full code  Continuous        11/17/21 1337           Code Status History     This patient has a current code status but no historical code status.       Prognosis: Guarded/poor  Discharge Planning: To Be Determined  Care plan was discussed with IDT  Thank you for allowing the Palliative Medicine Team to assist in the care of this patient.   Time In: 1640 Time Out: 1720 Total Time 40 Prolonged Time Billed No   Arrabella Westerman Domingo Cocking, MD  Please contact Palliative Medicine Team phone at [phone] for questions and concerns.

## 2022-01-16 NOTE — Progress Notes (Addendum)
Chaplain engaged in a follow-up visit with Cerise and was able to meet her Sister for the first time yesterday.  When Sister realized a Chaplain was in the room to visit she immediately became very frantic and agitated.  Sister became very tearful and started rocking back and forth.  Chaplain worked to provide sister some comfort around simply offering support to her and Laticha and not being there in terms of death or "bad news."  Sister had to spend some time becoming regulated and being able to converse with Chaplain.    Sister immediately began to share her disapproval with the medical teams care concerning Aletheia.  She voiced that when she would call Jaquilla over the phone she was told that she was doing fine.  She stated that she did not experience Melainie having any issues.  It was when she arrived that she noticed Terry was not doing well at all.  Sister is adamant about believing that Derby has not been taken care of adequately.  She stated that she didn't know anything about Javayah having trouble eating or what she was even in the hospital for.  She kept voicing that the medical team should have put a feeding tube in Iolanda 60 days ago.  Chaplain assesses that the time period that Kamerin has been in here to the state she is in right now for the Sister doesn't make any sense.  She believes that there are interventions that should have been happening some time ago and finds it hard to understand why those interventions are suddenly needed now.    Chaplain worked to help Wesleigh's sister focus on Jo-Anne's needs now.  When Chaplain asked if collectively they would like to prolong Samoria's life or see her peacefully pass on and be made comfortable, she stated that she wanted to prolong Melisse's life but that it is in God's hands.  She also voiced, "How can I make a decision like that?"  Sister frantically voiced several times, "We are not in sound mind to make a decision like that!"  Chaplain  could assess the need for them to relinquish that decision making power because of the toll it has taken on them mentally and emotionally.  Sister spoke to the ability of the medical team to override their decisions.  Chaplain worked to let her know the agency they hold as a family to be Cassadie's mouthpiece concerning her medical care.  Chaplain affirmed the ways the medical team does want to know their thoughts because they know Vilma best.  Sister was not able to relay a clear idea of what they desire or need for Chivon.  She voiced that Zohal is her sister and she loves her and she desires for her to have been treated like a human being in the last 60 days.  Sister stated that one time Thalya voiced to her, "Something is not right." That statement has left Sister with the belief that Tayia was not receiving adequate and appropriate care.    Jordyan's Sister also noted that having her other siblings who are in [place] involved doesn't make sense to her.  She voiced that they don't know the situation because they have never visited and don't know what she has been through.  For her, there absence in presence and communication disqualifies them from making any decisions.   Chaplain did her best to provide listening, support, and presence. Chaplain could assess the severity of Sister's ability to make a decision concerning Blimi's life  and health. Sister noted several times, "I'm not that smart."      01/16/22 0900  Clinical Encounter Type  Visited With Patient and family together  Visit Type Follow-up  Stress Factors  Family Stress Factors Loss of control;Major life changes;Lack of knowledge;Exhausted

## 2022-01-16 NOTE — Progress Notes (Signed)
Progress Note  60 Days Post-Op  Subjective: Pt resting comfortably in bed.  Still not eating.  Sister, [name] at bedside today.  Temp overnight of 100.1.  denies any pain.  Objective: Vital signs in last 24 hours: Temp:  [98.2 F (36.8 C)-100.1 F (37.8 C)] 99.2 F (37.3 C) (01/26 0849) Pulse Rate:  [75-89] 88 (01/26 0528) Resp:  [16-18] 16 (01/26 0528) BP: (122-127)/(65-71) 124/66 (01/26 0528) SpO2:  [98 %-99 %] 98 % (01/26 0528) Last BM Date: 01/12/22  Intake/Output from previous day: 01/25 0701 - 01/26 0700 In: 590.7 [I.V.:590.7] Out: 1875 [Urine:1600; Stool:275] Intake/Output this shift: No intake/output data recorded.  PE: General: resting, NAD Heart: regular Resp: normal work of breathing, CTAB Abdomen: soft, nondistended, nontender to palpation. Midline incision with no drainage and healing well. Ileostomy productive. 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  SCDs in place   Lab Results:  Recent Labs    01/15/22 0340 01/16/22 0341  WBC 6.1 5.9  HGB 8.0* 8.2*  HCT 26.6* 27.2*  PLT 281 306    BMET Recent Labs    01/14/22 0423 01/15/22 0340  NA 135 135  K 3.5 3.6  CL 107 107  CO2 24 25  GLUCOSE 119* 107*  BUN 8 8  CREATININE 0.32* <0.30*  CALCIUM 9.3 9.4   PT/INR No results for input(s): LABPROT, INR in the last 72 hours. CMP     Component Value Date/Time   NA 135 01/15/2022 0340   K 3.6 01/15/2022 0340   CL 107 01/15/2022 0340   CO2 25 01/15/2022 0340   GLUCOSE 107 (H) 01/15/2022 0340   BUN 8 01/15/2022 0340   CREATININE <0.30 (L) 01/15/2022 0340   CALCIUM 9.4 01/15/2022 0340   CALCIUM 10.9 (H) 01/01/2022 0449   PROT 6.7 01/15/2022 0340   ALBUMIN <1.5 (L) 01/15/2022 0340   AST 17 01/15/2022 0340   ALT 34 01/15/2022 0340   ALKPHOS 82 01/15/2022 0340   BILITOT 0.6 01/15/2022 0340   GFRNONAA NOT CALCULATED 01/15/2022 0340   Lipase     Component Value Date/Time   LIPASE 22  11/17/2021 0955       Studies/Results: No results found.  Anti-infectives: Anti-infectives (From admission, onward)    Start     Dose/Rate Route Frequency Ordered Stop   01/01/22 2100  piperacillin-tazobactam (ZOSYN) IVPB 3.375 g  Status:  Discontinued        3.375 g 12.5 mL/hr over 240 Minutes Intravenous Every 8 hours 01/01/22 2042 01/10/22 1333   11/25/21 1000  piperacillin-tazobactam (ZOSYN) IVPB 3.375 g  Status:  Discontinued        3.375 g 12.5 mL/hr over 240 Minutes Intravenous Every 8 hours 11/25/21 0909 12/01/21 0822   11/17/21 1830  piperacillin-tazobactam (ZOSYN) IVPB 3.375 g        3.375 g 12.5 mL/hr over 240 Minutes Intravenous Every 8 hours 11/17/21 1415 11/22/21 2359   11/17/21 1230  piperacillin-tazobactam (ZOSYN) IVPB 3.375 g        3.375 g 100 mL/hr over 30 Minutes Intravenous  Once 11/17/21 1220 11/17/21 1304        Assessment/Plan 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 Harlow Asa 11/27 - path: necrotic colon at site of perforation, no malignancy or dysplasia noted  - Ileostomy output improved. Continue BID metamucil.  - WOC following for ileostomy, mucous fistula - Wound with  blue-green, musty smelling drainage 1/11 - Dakin's TID x72 hrs. Wound probed and deeper tract opened after CT 1/11 with return of purulent drainage.   - upon DC of Dakin's would like TID dry dressing to help absorb drainage  - Pathology benign. CT distal descending/proximal sigmoid bowel luminal collapse. - CT 1/11 - some extravasated oral contrast in the subcutaneous abdominal wall superior and lateral to the ostomy at this level. There is inflammatory tissue at the level of the ostomy extending superiorly abutting the inferior tip of the liver and fundus of the gallbladder similar to the prior examination. There is likely some early fluid collection development anterior to the right kidney image 7/31  measuring 1.9 by 1.4 by 3.7 cm. Previously described fluid collections near the ostomy are not as well defined on the current study. 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 There is no free intraperitoneal air or ascites. Small air-fluid collection at the level of the umbilicus is again seen containing air. This collection measures 2.5 x 0.7 by 1.3 cm and has mildly increased in size. Air in the collection is new from prior. - no collection around the ileostomy that is drainable - the best way to try to resolve this is abx and improving nutrition -zosyn completed for last CT scan findings and UTI on 1/20 -Dr. Hassell Done stopped her TNA on 1/17 -at this time, the patient has no medical need for TNA as she has a functional GI tract.  Parenteral nutrition carries many long-term risks  and enteral nutrition is superior; therefore, oral intake or intake via other means such as g-tube, would be needed henceforth.    -sister, [name] at bedside today.  I discussed plan of care with her regarding removal of picc line, CXR, and recheck UA secondary to temp overnight of 100.1 -palliative consult placed to help assist [place] in discussions for goals of care with the family as we seem to not be able to make any progress.    FEN - Dysphagia 2 diet/but won't eat   VTE - SCDs, Lovenox ID - zosyn 11/27>12/2, 12/5>12/11, restarted 1/11>> 1/20 Foley - None currently    - encephalopathy/FTT - appreciate consultation at request of Dr. Johnnye Sima. Neurology saw a few weeks ago and signed off. On folic acid and thiamine supplementation. Family requesting further neurologic testing - appreciate Neuro seeing again. Appreciate TRH following as well.   - psych saw 12/27 and determined patient does not have capacity   - frequent urination - Urine Cx with enterococcus faecalis sensitive to ampicillin, Zosyn completed   Dispo:  unclear at this time   LOS: 60 days   I reviewed nursing notes, hospitalist notes, last 24 h vitals and pain scores, last 48 h intake and output, and last 24 h labs and trends.    Henreitta Cea, [facility] Surgery 01/16/2022, 9:39 AM Please see Amion for pager number during day hours 7:00am-4:30pm

## 2022-01-16 NOTE — Progress Notes (Signed)
Nutrition Follow-up  DOCUMENTATION CODES:   Severe malnutrition in context of acute illness/injury  INTERVENTION:   -If able, need to get new weight for admission  If PEG placed: -Will be at high refeeding syndrome risk -Initiate Osmolite 1.5 @ 20 ml/hr and advance by 10 ml every 24 hours to goal rate of 60 ml/hr. -provides 2160 kcals, 90g protein and 1097 ml H2O -Will need free water flushes of 200 ml every 6 hours (800 ml)   -If pt to remain full code and PEG is not placed: recommend TPN. Pt is severely malnourished, has nutritional deficiencies and at risk for more. Unable to meet her nutritional needs via PO which has been shown via multiple calorie counts and per reports from staff of pt pocketing food and refusing feeding assistance.   -Ensure Plus High Protein po TID, each supplement provides 350 kcal and 20 grams of protein.    -1 packet Juven BID, each packet provides 95 calories, 2.5 grams of protein (collagen)  NUTRITION DIAGNOSIS:   Severe Malnutrition related to acute illness as evidenced by energy intake < or equal to 50% for > or equal to 5 days, moderate fat depletion, severe muscle depletion.  Ongoing. Now likely related to chronic illness. Energy intake <75% for >/= 1 month.  GOAL:   Patient will meet greater than or equal to 90% of their needs  Not meeting.  MONITOR:   PO intake, Supplement acceptance, Labs, Weight trends, I & O's, PEG? GOC  ASSESSMENT:   66 year old female who presents to the emergency department with sudden onset of abdominal pain and distention.CT scan of abdomen and pelvis shows findings consistent with free air with a large pneumoperitoneum.  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  11/27: s/p ex lap, right colectomy, ileostomy 12/11: PICC placed 12/12: TPN initiated 1/17: TPN discontinued  Per surgery note, PICC to be removed.  Nutrition recommendations above remain the same as it doesn't look  like her code status is changing and family does not want PEG tube. TF recommendations above in case PEG is placed. Agree that PEG is preferable given pt has a functional gut. Noted that palliative care was consulted.  Per RN, pt refusing to be fed today. Last accepted Ensure on 1/24.  Does accept Juven (at most would provide 190 kcals and 5g protein).  Admission weight: 176 lbs. Last recorded weight 1/9: 161 lbs.  Per nursing documentation, pt with mild BLE edema.  Medications: Ferrous sulfate, Folic acid, Multivitamin with minerals daily, Niacin, KLOR-CON, Metamucil, Vitamin B-6, Thiamine, D5 infusion (~204 kcals)  Labs reviewed.  Diet Order:   Diet Order             DIET DYS 2 Room service appropriate? No; Fluid consistency: Thin  Diet effective now                   EDUCATION NEEDS:   Education needs have been addressed  Skin:  Skin Assessment: Skin Integrity Issues: Skin Integrity Issues:: Incisions, Other (Comment) Incisions: 11/27 abdomen Other: left thigh pressure injury  Last BM:  1/26 -ileostomy  Height:   Ht Readings from Last 1 Encounters:  11/17/21 5\' 10"  (1.778 m)    Weight:   Wt Readings from Last 1 Encounters:  12/30/21 73.3 kg    BMI:  Body mass index is 23.19 kg/m.  Estimated Nutritional Needs:   Kcal:  1950-2150  Protein:  95-105g  Fluid:  2L/day  Clayton Bibles, MS,  RD, LDN Inpatient Clinical Dietitian Contact information available via Amion

## 2022-01-16 NOTE — Progress Notes (Signed)
PROGRESS NOTE  Sherry Hampton QPY:[id number] DOB: [birth year] DOA: 11/17/2021 PCP: Pcp, No   LOS: 60 days   Brief narrative: 66 years old female with history of hyperlipidemia and mild memory loss presented to hospital with acute abnormal pain.  She was noted to have perforated viscus of the ascending colon with fecal peritonitis and required right colectomy and ileostomy.  Hospital course was complicated by fluid collection in the abdomen malnutrition progressive decline and encephalopathy.  TRH was consulted for assistance with general medical management.  Assessment/Plan:  Principal Problem:   Perforated abdominal viscus Active Problems:   GERD (gastroesophageal reflux disease)   Hyperlipidemia   Hyponatremia   Acute blood loss anemia   Acute metabolic encephalopathy   Protein-calorie malnutrition, severe   Delirium   Necrosis of colon with perforation    Ileostomy in place [facility])   Abnormal neurological exam   Memory loss   Hypercalcemia   Folate deficiency   Common bile duct dilatation   Enterococcus UTI   Goals of care, counseling/discussion   Vitamin B6 deficiency   Perforated abdominal viscus  Present on admission.  Status post exploratory laparotomy with left colectomy and end ileostomy.  Primary care team on board.    Enterococcus UTI - resolved . Completed course of antibiotic.  Episodes of fever. Temp max of 100.1 F.  Primary team planning for removal of PICC line, chest x-ray and urinalysis.  Mild transaminitis Has improved    Acute metabolic encephalopathy Fluctuating mental status.  Extensive work-up done during this hospitalization.  Thought to be secondary to delirium from hospitalization.   MRI brain 12/11/21 without acute findings and repeat 01/05/22 again without acute findings.  EEG only noted slowing consistent with encephalopathy.  Neurology was consulted during hospitalization.  Continue supportive care.   Vitamin B6 deficiency.  Continue  supplementation.   Folic acid deficiency Continue supplementation    Hypercalcemia Latest calcium level of 9.4.  Likely secondary to sedentary status.  PTH related peptide was less than 2.   Hypokalemia Improved after replacement.  Potassium 3.6   Hypomagnesemia Improved after replacement.  Latest magnesium of 1.7.   Severe protein calorie malnutrition Primary team has recommended PEG to family, but thus far they have refused   Failure to thrive, poor oral intake. Family has not decided with PEG tube placement.  Goals of care.  Palliative care has been consulted.  DVT prophylaxis: enoxaparin (LOVENOX) injection 40 mg Start: 11/18/21 0800 SCDs Start: 11/17/21 1336   Code Status: Full code  Family Communication: With the patient's sister at bedside.  Status is: Inpatient  Remains inpatient appropriate because: Failure to thrive, poor nutrition, status post colectomy.   Procedures: Status post exploratory laparotomy with right colectomy and ileostomy on 11/17/2021  Anti-infectives:  None  Anti-infectives (From admission, onward)    Start     Dose/Rate Route Frequency Ordered Stop   01/01/22 2100  piperacillin-tazobactam (ZOSYN) IVPB 3.375 g  Status:  Discontinued        3.375 g 12.5 mL/hr over 240 Minutes Intravenous Every 8 hours 01/01/22 2042 01/10/22 1333   11/25/21 1000  piperacillin-tazobactam (ZOSYN) IVPB 3.375 g  Status:  Discontinued        3.375 g 12.5 mL/hr over 240 Minutes Intravenous Every 8 hours 11/25/21 0909 12/01/21 0822   11/17/21 1830  piperacillin-tazobactam (ZOSYN) IVPB 3.375 g        3.375 g 12.5 mL/hr over 240 Minutes Intravenous Every 8 hours 11/17/21 1415 11/22/21 2359   11/17/21 1230  piperacillin-tazobactam (ZOSYN) IVPB 3.375 g        3.375 g 100 mL/hr over 30 Minutes Intravenous  Once 11/17/21 1220 11/17/21 1304       Subjective: Today, patient was seen and examined at bedside.  Patient's sister at bedside.  Patient denies pain.   Had some fever yesterday.   Objective: Vitals:   01/16/22 0528 01/16/22 0849  BP: 124/66   Pulse: 88   Resp: 16   Temp: 98.2 F (36.8 C) 99.2 F (37.3 C)  SpO2: 98%     Intake/Output Summary (Last 24 hours) at 01/16/2022 1334 Last data filed at 01/16/2022 1100 Gross per 24 hour  Intake 1242.03 ml  Output 1650 ml  Net -407.97 ml    Filed Weights   12/17/21 0500 12/24/21 0500 12/30/21 0635  Weight: 76.5 kg 77.9 kg 73.3 kg   Body mass index is 23.19 kg/m.   Physical Exam: General:  Average built, not in obvious distress, mildly verbal HENT:   No scleral pallor or icterus noted. Oral mucosa is moist.  Chest:   Diminished breath sounds bilaterally. No crackles or wheezes.  CVS: S1 &S2 heard. No murmur.  Regular rate and rhythm. Abdomen: Midline incision,  Ileostomy in place. Extremities: No cyanosis, clubbing or edema.  Peripheral pulses are palpable. Psych: Alert, awake and oriented, normal mood CNS: Weak and deconditioned, multiple Skin: Abdominal wall with incision.,  Ileostomy.  Data Review: I have personally reviewed the following laboratory data and studies,  CBC: Recent Labs  Lab 01/10/22 0421 01/11/22 0314 01/15/22 0340 01/16/22 0341  WBC 4.8 4.5 6.1 5.9  HGB 7.9* 7.8* 8.0* 8.2*  HCT 26.5* 25.6* 26.6* 27.2*  MCV 87.7 88.6 87.5 87.7  PLT 258 238 281 086    Basic Metabolic Panel: Recent Labs  Lab 01/10/22 0421 01/11/22 0314 01/13/22 0352 01/14/22 0423 01/15/22 0340  NA 136 137 134* 135 135  K 2.9* 3.6 3.3* 3.5 3.6  CL 111 112* 109 107 107  CO2 21* 21* 24 24 25   GLUCOSE 81 107* 106* 119* 107*  BUN 10 7* 5* 8 8  CREATININE 0.51 0.31* 0.38* 0.32* <0.30*  CALCIUM 8.7* 8.8* 9.1 9.3 9.4  MG 1.7 1.9  --   --  1.7  PHOS 2.2*  --   --   --   --     Liver Function Tests: Recent Labs  Lab 01/10/22 0421 01/11/22 0314 01/13/22 0352 01/15/22 0340  AST 212* 125* 32 17  ALT 165* 130* 66* 34  ALKPHOS 113 122 109 82  BILITOT 0.4 0.3 0.5 0.6  PROT  6.3* 6.0* 6.6 6.7  ALBUMIN 1.6* <1.5* 1.6* <1.5*    No results for input(s): LIPASE, AMYLASE in the last 168 hours. No results for input(s): AMMONIA in the last 168 hours. Cardiac Enzymes: No results for input(s): CKTOTAL, CKMB, CKMBINDEX, TROPONINI in the last 168 hours. BNP (last 3 results) No results for input(s): BNP in the last 8760 hours.  ProBNP (last 3 results) No results for input(s): PROBNP in the last 8760 hours.  CBG: No results for input(s): GLUCAP in the last 168 hours.  No results found for this or any previous visit (from the past 240 hour(s)).   Studies: DG CHEST PORT 1 VIEW  Result Date: 01/16/2022 CLINICAL DATA:  Fever. EXAM: PORTABLE CHEST 1 VIEW COMPARISON:  12/20/2021 FINDINGS: A right PICC remains in place terminating over the mid to lower SVC. The cardiomediastinal silhouette is unchanged with normal heart size. No airspace  consolidation, edema, sizable pleural effusion, or pneumothorax is identified. No acute osseous abnormality is seen. IMPRESSION: No active disease. Electronically Signed   By: Logan Bores M.D.   On: 01/16/2022 10:13      Flora Lipps, MD  [facility] 01/16/2022  If 7PM-7AM, please contact night-coverage

## 2022-01-17 NOTE — Plan of Care (Signed)
  Problem: Coping: Goal: Level of anxiety will decrease Outcome: Progressing   Problem: Pain Managment: Goal: General experience of comfort will improve Outcome: Progressing   Problem: Safety: Goal: Ability to remain free from injury will improve Outcome: Progressing   

## 2022-01-17 NOTE — Progress Notes (Signed)
Midline abdominal dressing changed per order, patient tolerated well, no drainage noted, pink wound bed.

## 2022-01-17 NOTE — Progress Notes (Signed)
In room with NT to clean pt up. Ostomy bag accidentally popped open and spilled out. Sister at bedside picked up phone and made pictures of the pt. I advised her to stop making pictures and/or videos. Explained that pt unable to consent and was against her privacy. Called AC and notified unit AD.

## 2022-01-17 NOTE — Progress Notes (Signed)
Patient triggered a yellow mews for elevated temp and heart rate (100.5 and 105 bpm), patient resting in bed in no acute distress, no shortness of breath noted, room noted to be extremely warm, thermostat lowered from 80 to 68, attempted to give oral ibuprofen- patient took about half in orange sherbet and then started spitting it out, ice packs were placed under bilateral arms, will continue to monitor.

## 2022-01-17 NOTE — Progress Notes (Signed)
Progress Note  61 Days Post-Op  Subjective: Pt resting comfortably in bed.  Still not eating.  Sister, Sherry Hampton at bedside today.  Temp overnight of 100.5  denies abdominal pain.  Getting her bed changed over currently.  Objective: Vital signs in last 24 hours: Temp:  [98.1 F (36.7 C)-100.5 F (38.1 C)] 98.1 F (36.7 C) (01/27 0809) Pulse Rate:  [81-107] 89 (01/27 0809) Resp:  [16-20] 20 (01/27 0809) BP: (120-140)/(68-79) 125/78 (01/27 0809) SpO2:  [96 %-99 %] 98 % (01/27 0809) Last BM Date: 01/17/22  Intake/Output from previous day: 01/26 0701 - 01/27 0700 In: 2035.9 [P.O.:210; I.V.:1825.9] Out: 980 [Urine:700; Stool:280] Intake/Output this shift: No intake/output data recorded.  PE: General: resting, NAD Heart: regular Resp: normal work of breathing, CTAB Abdomen: soft, nondistended, nontender to palpation. Midline incision with no drainage and healing well. Ileostomy productive. 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  SCDs in place   Lab Results:  Recent Labs    01/15/22 0340 01/16/22 0341  WBC 6.1 5.9  HGB 8.0* 8.2*  HCT 26.6* 27.2*  PLT 281 306    BMET Recent Labs    01/15/22 0340  NA 135  K 3.6  CL 107  CO2 25  GLUCOSE 107*  BUN 8  CREATININE <0.30*  CALCIUM 9.4   PT/INR No results for input(s): LABPROT, INR in the last 72 hours. CMP     Component Value Date/Time   NA 135 01/15/2022 0340   K 3.6 01/15/2022 0340   CL 107 01/15/2022 0340   CO2 25 01/15/2022 0340   GLUCOSE 107 (H) 01/15/2022 0340   BUN 8 01/15/2022 0340   CREATININE <0.30 (L) 01/15/2022 0340   CALCIUM 9.4 01/15/2022 0340   CALCIUM 10.9 (H) 01/01/2022 0449   PROT 6.7 01/15/2022 0340   ALBUMIN <1.5 (L) 01/15/2022 0340   AST 17 01/15/2022 0340   ALT 34 01/15/2022 0340   ALKPHOS 82 01/15/2022 0340   BILITOT 0.6 01/15/2022 0340   GFRNONAA NOT CALCULATED 01/15/2022 0340   Lipase     Component Value Date/Time    LIPASE 22 11/17/2021 0955       Studies/Results: DG CHEST PORT 1 VIEW  Result Date: 01/16/2022 CLINICAL DATA:  Fever. EXAM: PORTABLE CHEST 1 VIEW COMPARISON:  12/20/2021 FINDINGS: A right PICC remains in place terminating over the mid to lower SVC. The cardiomediastinal silhouette is unchanged with normal heart size. No airspace consolidation, edema, sizable pleural effusion, or pneumothorax is identified. No acute osseous abnormality is seen. IMPRESSION: No active disease. Electronically Signed   By: Logan Bores M.D.   On: 01/16/2022 10:13    Anti-infectives: Anti-infectives (From admission, onward)    Start     Dose/Rate Route Frequency Ordered Stop   01/01/22 2100  piperacillin-tazobactam (ZOSYN) IVPB 3.375 g  Status:  Discontinued        3.375 g 12.5 mL/hr over 240 Minutes Intravenous Every 8 hours 01/01/22 2042 01/10/22 1333   11/25/21 1000  piperacillin-tazobactam (ZOSYN) IVPB 3.375 g  Status:  Discontinued        3.375 g 12.5 mL/hr over 240 Minutes Intravenous Every 8 hours 11/25/21 0909 12/01/21 0822   11/17/21 1830  piperacillin-tazobactam (ZOSYN) IVPB 3.375 g        3.375 g 12.5 mL/hr over 240 Minutes Intravenous Every 8 hours 11/17/21 1415 11/22/21 2359   11/17/21 1230  piperacillin-tazobactam (ZOSYN) IVPB 3.375 g  3.375 g 100 mL/hr over 30 Minutes Intravenous  Once 11/17/21 1220 11/17/21 1304        Assessment/Plan 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 Harlow Hampton 11/27 - path: necrotic colon at site of perforation, no malignancy or dysplasia noted  - Ileostomy output improved. Continue BID metamucil.  - WOC following for ileostomy, mucous fistula - Wound with blue-green, musty smelling drainage 1/11 - Dakin's TID x72 hrs. Wound probed and deeper tract opened after CT 1/11 with return of purulent drainage.   - upon DC of Dakin's would like TID dry dressing to help absorb  drainage  - Pathology benign. CT distal descending/proximal sigmoid bowel luminal collapse. - CT 1/11 - some extravasated oral contrast in the subcutaneous abdominal wall superior and lateral to the ostomy at this level. There is inflammatory tissue at the level of the ostomy extending superiorly abutting the inferior tip of the liver and fundus of the gallbladder similar to the prior examination. There is likely some early fluid collection development anterior to the right kidney image 7/31 measuring 1.9 by 1.4 by 3.7 cm. Previously described fluid collections near the ostomy are not as well defined on the current study. 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 There is no free intraperitoneal air or ascites. Small air-fluid collection at the level of the umbilicus is again seen containing air. This collection measures 2.5 x 0.7 by 1.3 cm and has mildly increased in size. Air in the collection is new from prior. - no collection around the ileostomy that is drainable - the best way to try to resolve this is abx and improving nutrition -zosyn completed for last CT scan findings and UTI on 1/20 -Dr. Hassell Hampton stopped her TNA on 1/17 -at this time, the patient has no medical need for TNA as she has a functional GI tract.  Parenteral nutrition carries many long-term risks  and enteral nutrition is superior; therefore, oral intake or intake via other means such as g-tube, would be needed henceforth.    -sister, Sherry Hampton at bedside today.  She asked today when we were planning to get her to a nursing facility.  I explained, again, that we are not able to get her to a facility at this point as she has no nutritional intake or access such as a g-tube.  She then asked why we weren't making the decision to place a g-tube.  We did not get in a long discussion about this as it has been discussed numerous times before, but  politely reminded her that we can not at this time make decisions for the patient as the siblings currently have not given up their rights to do so.  See Chaplain's note for Sherry Hampton's feelings on her siblings having a say so in the patient's care. -palliative saw the patient yesterday, but sister stated she was on an important phone call and they would have to come back.  They are going to reattempt today. -temp overnight of 100.5.  fevers are only cyclical at night right now and only between 100.1-max 101 3 days ago.  Will draw blood cultures.  Discussed this with Sherry Hampton.    FEN - Dysphagia 2 diet/but won't eat   VTE - SCDs, Lovenox ID - zosyn 11/27>12/2, 12/5>12/11, restarted 1/11>> 1/20 Foley - None currently    - encephalopathy/FTT - appreciate consultation  at request of Dr. Johnnye Sima. Neurology saw a few weeks ago and signed off. On folic acid and thiamine supplementation. Family requesting further neurologic testing - appreciate Neuro seeing again. Appreciate TRH following as well.   - psych saw 12/27 and determined patient does not have capacity   - frequent urination - Urine Cx with enterococcus faecalis sensitive to ampicillin, Zosyn completed   Dispo: unclear at this time   LOS: 61 days   I reviewed nursing notes, hospitalist notes, last 24 h vitals and pain scores, last 48 h intake and output, and last 24 h labs and trends.    Henreitta Cea, [facility] 01/17/2022, 9:05 AM Please see Amion for pager number during day hours 7:00am-4:30pm

## 2022-01-17 NOTE — Progress Notes (Signed)
PROGRESS NOTE  Sherry Hampton ASN:[id number] DOB: 08/03/1956 DOA: 11/17/2021 PCP: Pcp, No   LOS: 61 days   Brief narrative: 66 years old female with history of hyperlipidemia and mild memory loss presented to hospital with acute abnormal pain.  She was noted to have perforated viscus of the ascending colon with fecal peritonitis and required right colectomy and ileostomy.  Hospital course was complicated by fluid collection in the abdomen malnutrition progressive decline and encephalopathy.  TRH was consulted for assistance with general medical management.  Assessment/Plan:  Principal Problem:   Perforated abdominal viscus Active Problems:   GERD (gastroesophageal reflux disease)   Hyperlipidemia   Hyponatremia   Acute blood loss anemia   Acute metabolic encephalopathy   Protein-calorie malnutrition, severe   Delirium   Necrosis of colon with perforation    Ileostomy in place [facility])   Abnormal neurological exam   Memory loss   Hypercalcemia   Folate deficiency   Common bile duct dilatation   Enterococcus UTI   Goals of care, counseling/discussion   Vitamin B6 deficiency   Perforated abdominal viscus  Present on admission.  Status post exploratory laparotomy with left colectomy and end ileostomy.  Primary care team on board.    Enterococcus UTI - resolved . Completed course of antibiotic recently.Marland Kitchen  Episode of fever. Temp max of 100.5 F.  Urinalysis less than 5 white cells.  Blood cultures have been sent 127.  Urine culture pending.  X-ray done on 01/16/2022 showed no evidence of infiltrate.  We will need to continue to monitor.  Status post PICC line removal.  Mild transaminitis Has improved    Acute metabolic encephalopathy   MRI brain 12/11/21 without acute findings and repeat 01/05/22 again without acute findings.  EEG only noted slowing consistent with encephalopathy.  Neurology was consulted during hospitalization.  Continue supportive care.   Vitamin B6 deficiency.   Continue supplementation.   Folic acid deficiency Continue supplementation    Hypercalcemia Latest calcium level of 9.4.  Likely secondary to sedentary status.  PTH related peptide was less than 2.   Hypokalemia Improved after replacement.  Potassium 3.6   Hypomagnesemia Improved after replacement.  Latest magnesium of 1.7.   Severe protein calorie malnutrition, failure to thrive, poor oral intake Primary team has recommended PEG to family, but thus far they have refused   Goals of care.  Palliative care has been consulted.  DVT prophylaxis: enoxaparin (LOVENOX) injection 40 mg Start: 11/18/21 0800 SCDs Start: 11/17/21 1336   Code Status: Full code  Status is: Inpatient  Procedures: Status post exploratory laparotomy with right colectomy and ileostomy on 11/17/2021  Anti-infectives:  None  Anti-infectives (From admission, onward)    Start     Dose/Rate Route Frequency Ordered Stop   01/01/22 2100  piperacillin-tazobactam (ZOSYN) IVPB 3.375 g  Status:  Discontinued        3.375 g 12.5 mL/hr over 240 Minutes Intravenous Every 8 hours 01/01/22 2042 01/10/22 1333   11/25/21 1000  piperacillin-tazobactam (ZOSYN) IVPB 3.375 g  Status:  Discontinued        3.375 g 12.5 mL/hr over 240 Minutes Intravenous Every 8 hours 11/25/21 0909 12/01/21 0822   11/17/21 1830  piperacillin-tazobactam (ZOSYN) IVPB 3.375 g        3.375 g 12.5 mL/hr over 240 Minutes Intravenous Every 8 hours 11/17/21 1415 11/22/21 2359   11/17/21 1230  piperacillin-tazobactam (ZOSYN) IVPB 3.375 g        3.375 g 100 mL/hr over 30 Minutes Intravenous  Once 11/17/21 1220 11/17/21 1304       Subjective: Today, patient was seen and examined at bedside.  Denies pain.  Had a fever of 100.5 F yesterday.  Objective: Vitals:   01/17/22 0809 01/17/22 1247  BP: 125/78 129/75  Pulse: 89 81  Resp: 20 18  Temp: 98.1 F (36.7 C) 98.7 F (37.1 C)  SpO2: 98%     Intake/Output Summary (Last 24 hours) at  01/17/2022 1355 Last data filed at 01/17/2022 1251 Gross per 24 hour  Intake 1000.44 ml  Output 980 ml  Net 20.44 ml    Filed Weights   12/17/21 0500 12/24/21 0500 12/30/21 0635  Weight: 76.5 kg 77.9 kg 73.3 kg   Body mass index is 23.19 kg/m.   Physical Exam: General:  Average built, not in obvious distress, mildly verbal HENT:   No scleral pallor or icterus noted. Oral mucosa is moist.  Chest:   Diminished breath sounds bilaterally. CVS: S1 &S2 heard. No murmur.  Regular rate and rhythm. Abdomen: Midline incision,  Ileostomy in place. Extremities: No cyanosis, clubbing or edema.  Peripheral pulses are palpable. Psych: Alert, awake, [place] CNS: Weak and deconditioned, alert awake, Skin: Abdominal wall with incision.,  Ileostomy.  Data Review: I have personally reviewed the following laboratory data and studies,  CBC: Recent Labs  Lab 01/11/22 0314 01/15/22 0340 01/16/22 0341  WBC 4.5 6.1 5.9  HGB 7.8* 8.0* 8.2*  HCT 25.6* 26.6* 27.2*  MCV 88.6 87.5 87.7  PLT 238 281 850    Basic Metabolic Panel: Recent Labs  Lab 01/11/22 0314 01/13/22 0352 01/14/22 0423 01/15/22 0340  NA 137 134* 135 135  K 3.6 3.3* 3.5 3.6  CL 112* 109 107 107  CO2 21* 24 24 25   GLUCOSE 107* 106* 119* 107*  BUN 7* 5* 8 8  CREATININE 0.31* 0.38* 0.32* <0.30*  CALCIUM 8.8* 9.1 9.3 9.4  MG 1.9  --   --  1.7    Liver Function Tests: Recent Labs  Lab 01/11/22 0314 01/13/22 0352 01/15/22 0340  AST 125* 32 17  ALT 130* 66* 34  ALKPHOS 122 109 82  BILITOT 0.3 0.5 0.6  PROT 6.0* 6.6 6.7  ALBUMIN <1.5* 1.6* <1.5*    No results for input(s): LIPASE, AMYLASE in the last 168 hours. No results for input(s): AMMONIA in the last 168 hours. Cardiac Enzymes: No results for input(s): CKTOTAL, CKMB, CKMBINDEX, TROPONINI in the last 168 hours. BNP (last 3 results) No results for input(s): BNP in the last 8760 hours.  ProBNP (last 3 results) No results for input(s): PROBNP in the last 8760  hours.  CBG: No results for input(s): GLUCAP in the last 168 hours.  No results found for this or any previous visit (from the past 240 hour(s)).   Studies: DG CHEST PORT 1 VIEW  Result Date: 01/16/2022 CLINICAL DATA:  Fever. EXAM: PORTABLE CHEST 1 VIEW COMPARISON:  12/20/2021 FINDINGS: A right PICC remains in place terminating over the mid to lower SVC. The cardiomediastinal silhouette is unchanged with normal heart size. No airspace consolidation, edema, sizable pleural effusion, or pneumothorax is identified. No acute osseous abnormality is seen. IMPRESSION: No active disease. Electronically Signed   By: Logan Bores M.D.   On: 01/16/2022 10:13      Flora Lipps, MD  [facility] 01/17/2022  If 7PM-7AM, please contact night-coverage

## 2022-01-18 LAB — BLOOD CULTURE ID PANEL (REFLEXED) - BCID2

## 2022-01-18 LAB — BASIC METABOLIC PANEL
Anion gap: 4 — ABNORMAL LOW (ref 5–15)
BUN: 14 mg/dL (ref 8–23)
CO2: 26 mmol/L (ref 22–32)
Calcium: 10 mg/dL (ref 8.9–10.3)
Chloride: 109 mmol/L (ref 98–111)
Creatinine, Ser: 0.37 mg/dL — ABNORMAL LOW (ref 0.44–1.00)
GFR, Estimated: >60 mL/min (ref >60)
Glucose, Bld: 109 mg/dL — ABNORMAL HIGH (ref 70–99)
Potassium: 3.7 mmol/L (ref 3.5–5.1)
Sodium: 139 mmol/L (ref 135–145)

## 2022-01-18 LAB — URINE CULTURE

## 2022-01-18 LAB — CBC
HCT: 31.4 % — ABNORMAL LOW (ref 36.0–46.0)
Hemoglobin: 9.2 g/dL — ABNORMAL LOW (ref 12.0–15.0)
MCH: 26.2 pg (ref 26.0–34.0)
MCHC: 29.3 g/dL — ABNORMAL LOW (ref 30.0–36.0)
MCV: 89.5 fL (ref 80.0–100.0)
Platelets: 308 10*3/uL (ref 150–400)
RBC: 3.51 MIL/uL — ABNORMAL LOW (ref 3.87–5.11)
RDW: 18.3 % — ABNORMAL HIGH (ref 11.5–15.5)
WBC: 6.9 10*3/uL (ref 4.0–10.5)
nRBC: 0 % (ref 0.0–0.2)

## 2022-01-18 NOTE — Progress Notes (Signed)
PROGRESS NOTE  Sherry Hampton ZWC:[id number] DOB: February 12, 1956 DOA: 11/17/2021 PCP: Pcp, No   LOS: 22 days   Brief narrative:  66 years old female with history of hyperlipidemia and mild memory loss presented to hospital with acute abnormal pain.  She was noted to have perforated viscus of the ascending colon with fecal peritonitis and required right colectomy and ileostomy.  Hospital course was complicated by fluid collection in the abdomen malnutrition progressive decline and encephalopathy.  TRH was consulted for assistance with general medical management.  Assessment/Plan:  Principal Problem:   Perforated abdominal viscus Active Problems:   GERD (gastroesophageal reflux disease)   Hyperlipidemia   Hyponatremia   Acute blood loss anemia   Acute metabolic encephalopathy   Protein-calorie malnutrition, severe   Delirium   Necrosis of colon with perforation    Ileostomy in place [facility])   Abnormal neurological exam   Memory loss   Hypercalcemia   Folate deficiency   Common bile duct dilatation   Enterococcus UTI   Goals of care, counseling/discussion   Vitamin B6 deficiency   Perforated abdominal viscus  Present on admission.  Status post exploratory laparotomy with left colectomy and end ileostomy.  Primary care team on board.    Enterococcus UTI - resolved . Completed course of antibiotic recently.Marland Kitchen  Episode of fever. Temp max of 99.3 degree F.  Urinalysis less than 5 white cells and urine culture with multiple species.  Blood culture showing MRSA positive staph epidermidis in 1 bottle.  Could be contaminant.  No more further spike of fever. X-ray done on 01/16/2022 showed no evidence of infiltrate.  We will need to continue to monitor.  Status post PICC line removal.  Mild transaminitis Has improved    Acute metabolic encephalopathy Slow to respond.  Communicative.  MRI brain 12/11/21 without acute findings and repeat 01/05/22 again without acute findings.  EEG only noted  slowing consistent with encephalopathy.  Neurology was consulted during hospitalization.  Continue supportive care.   Vitamin B6 deficiency.  Continue supplementation.   Folic acid deficiency Continue supplementation    Hypercalcemia Latest calcium level of 10.0 likely secondary to sedentary status.  PTH related peptide was less than 2.   Hypokalemia Improved after replacement.  Potassium 3.7   Hypomagnesemia Improved after replacement.  Latest magnesium of 1.7.   Severe protein calorie malnutrition, failure to thrive, poor oral intake Primary team has recommended PEG to family, palliative care is discussing as well.  Goals of care.  Palliative care  on board.  DVT prophylaxis: enoxaparin (LOVENOX) injection 40 mg Start: 11/18/21 0800 SCDs Start: 11/17/21 1336   Code Status: Full code  Status is: Inpatient  Procedures: Status post exploratory laparotomy with right colectomy and ileostomy on 11/17/2021  Anti-infectives:  None  Anti-infectives (From admission, onward)    Start     Dose/Rate Route Frequency Ordered Stop   01/01/22 2100  piperacillin-tazobactam (ZOSYN) IVPB 3.375 g  Status:  Discontinued        3.375 g 12.5 mL/hr over 240 Minutes Intravenous Every 8 hours 01/01/22 2042 01/10/22 1333   11/25/21 1000  piperacillin-tazobactam (ZOSYN) IVPB 3.375 g  Status:  Discontinued        3.375 g 12.5 mL/hr over 240 Minutes Intravenous Every 8 hours 11/25/21 0909 12/01/21 0822   11/17/21 1830  piperacillin-tazobactam (ZOSYN) IVPB 3.375 g        3.375 g 12.5 mL/hr over 240 Minutes Intravenous Every 8 hours 11/17/21 1415 11/22/21 2359   11/17/21 1230  piperacillin-tazobactam (ZOSYN) IVPB 3.375 g        3.375 g 100 mL/hr over 30 Minutes Intravenous  Once 11/17/21 1220 11/17/21 1304       Subjective: Today, patient was seen and examined at bedside.  Mildly verbally active.  Patient denies obvious pain.  Patient's sister at bedside.  No nausea or  vomiting.  Objective: Vitals:   01/18/22 0700 01/18/22 1301  BP: 115/71 121/73  Pulse: 93 91  Resp: 16 14  Temp: (!) 97.5 F (36.4 C) 97.9 F (36.6 C)  SpO2: 100% 99%    Intake/Output Summary (Last 24 hours) at 01/18/2022 1534 Last data filed at 01/18/2022 1311 Gross per 24 hour  Intake 743.24 ml  Output 850 ml  Net -106.76 ml    Filed Weights   12/17/21 0500 12/24/21 0500 12/30/21 0635  Weight: 76.5 kg 77.9 kg 73.3 kg   Body mass index is 23.19 kg/m.   Physical Exam: General:  Average built, not in obvious distress, mildly verbal HENT:   No scleral pallor or icterus noted. Oral mucosa is moist.  Chest:   Diminished breath sounds bilaterally. CVS: S1 &S2 heard. No murmur.  Regular rate and rhythm. Abdomen: Midline incision,  Ileostomy in place. Extremities: No cyanosis, clubbing or edema.  Peripheral pulses are palpable. Psych: Alert, awake, communicative.  Slow to respond. CNS: Weak and deconditioned, alert awake, Skin: Abdominal wall with incision.,  Ileostomy.  Data Review: I have personally reviewed the following laboratory data and studies,  CBC: Recent Labs  Lab 01/15/22 0340 01/16/22 0341 01/18/22 0449  WBC 6.1 5.9 6.9  HGB 8.0* 8.2* 9.2*  HCT 26.6* 27.2* 31.4*  MCV 87.5 87.7 89.5  PLT 281 306 440    Basic Metabolic Panel: Recent Labs  Lab 01/13/22 0352 01/14/22 0423 01/15/22 0340 01/18/22 0449  NA 134* 135 135 139  K 3.3* 3.5 3.6 3.7  CL 109 107 107 109  CO2 24 24 25 26   GLUCOSE 106* 119* 107* 109*  BUN 5* 8 8 14   CREATININE 0.38* 0.32* <0.30* 0.37*  CALCIUM 9.1 9.3 9.4 10.0  MG  --   --  1.7  --     Liver Function Tests: Recent Labs  Lab 01/13/22 0352 01/15/22 0340  AST 32 17  ALT 66* 34  ALKPHOS 109 82  BILITOT 0.5 0.6  PROT 6.6 6.7  ALBUMIN 1.6* <1.5*    No results for input(s): LIPASE, AMYLASE in the last 168 hours. No results for input(s): AMMONIA in the last 168 hours. Cardiac Enzymes: No results for input(s):  CKTOTAL, CKMB, CKMBINDEX, TROPONINI in the last 168 hours. BNP (last 3 results) No results for input(s): BNP in the last 8760 hours.  ProBNP (last 3 results) No results for input(s): PROBNP in the last 8760 hours.  CBG: No results for input(s): GLUCAP in the last 168 hours.  Recent Results (from the past 240 hour(s))  Urine Culture     Status: Abnormal   Collection Time: 01/16/22  5:44 PM   Specimen: Urine, Clean Catch  Result Value Ref Range Status   Specimen Description   Final    URINE, CLEAN CATCH Performed at [facility], [place] [address] [place], [place] [address]    Special Requests   Final    NONE Performed at [facility], [place] [address], [place], [place] [address]    Culture MULTIPLE SPECIES PRESENT, SUGGEST RECOLLECTION (A)  Final   Report Status 01/18/2022 FINAL  Final  Culture, blood (Routine X 2) w Reflex to ID Panel     Status: None (Preliminary result)   Collection Time: 01/17/22  6:40 AM   Specimen: BLOOD LEFT HAND  Result Value Ref Range Status   Specimen Description   Final    BLOOD LEFT HAND Performed at [place] [address], [place], [place] [address]    Special Requests   Final    BOTTLES DRAWN AEROBIC ONLY Blood Culture results may not be optimal due to an inadequate volume of blood received in culture bottles Performed at [place] [address] [place], [place] [address]    Culture   Final    NO GROWTH 1 DAY Performed at [facility], [place] [address], [place], [place] [address]    Report Status PENDING  Incomplete  Culture, blood (Routine X 2) w Reflex to ID Panel     Status: None (Preliminary result)   Collection Time: 01/17/22  7:06 AM   Specimen: BLOOD  Result Value Ref Range Status   Specimen Description   Final    BLOOD RIGHT WRIST Performed at [place] [address], [place], [place] [address]    Special Requests    Final    BOTTLES DRAWN AEROBIC ONLY Blood Culture results may not be optimal due to an inadequate volume of blood received in culture bottles Performed at [place] [address], [place], [place] [address]    Culture  Setup Time   Final    GRAM POSITIVE COCCI IN CLUSTERS AEROBIC BOTTLE ONLY CRITICAL RESULT CALLED TO, READ BACK BY AND VERIFIED WITH: J,GADHIA PHARMD @1050  01/18/22 EB Performed at [facility], [place] [address], [place],  [address]    Culture GRAM POSITIVE COCCI  Final   Report Status PENDING  Incomplete  Blood Culture ID Panel (Reflexed)     Status: Abnormal   Collection Time: 01/17/22  7:06 AM  Result Value Ref Range Status   Enterococcus faecalis NOT DETECTED NOT DETECTED Final   Enterococcus Faecium NOT DETECTED NOT DETECTED Final   Listeria monocytogenes NOT DETECTED NOT DETECTED Final   Staphylococcus species DETECTED (A) NOT DETECTED Final    Comment: CRITICAL RESULT CALLED TO, READ BACK BY AND VERIFIED WITH: J,GADHIA PHARMD @1050  01/18/22 EB    Staphylococcus aureus (BCID) NOT DETECTED NOT DETECTED Final   Staphylococcus epidermidis DETECTED (A) NOT DETECTED Final    Comment: Methicillin (oxacillin) resistant coagulase negative staphylococcus. Possible blood culture contaminant (unless isolated from more than one blood culture draw or clinical case suggests pathogenicity). No antibiotic treatment is indicated for blood  culture contaminants. CRITICAL RESULT CALLED TO, READ BACK BY AND VERIFIED WITH: J,GADHIA PHARMD @1050  01/18/22 EB    Staphylococcus lugdunensis NOT DETECTED NOT DETECTED Final   Streptococcus species NOT DETECTED NOT DETECTED Final   Streptococcus agalactiae NOT DETECTED NOT DETECTED Final   Streptococcus pneumoniae NOT DETECTED NOT DETECTED Final   Streptococcus pyogenes NOT DETECTED NOT DETECTED Final   A.calcoaceticus-baumannii NOT DETECTED NOT DETECTED Final   Bacteroides fragilis NOT DETECTED NOT DETECTED  Final   Enterobacterales NOT DETECTED NOT DETECTED Final   Enterobacter cloacae complex NOT DETECTED NOT DETECTED Final   Escherichia coli NOT DETECTED NOT DETECTED Final   Klebsiella aerogenes NOT DETECTED NOT DETECTED Final   Klebsiella oxytoca NOT DETECTED NOT DETECTED Final   Klebsiella pneumoniae NOT DETECTED NOT DETECTED Final   Proteus species NOT DETECTED NOT DETECTED  Final   Salmonella species NOT DETECTED NOT DETECTED Final   Serratia marcescens NOT DETECTED NOT DETECTED Final   Haemophilus influenzae NOT DETECTED NOT DETECTED Final   Neisseria meningitidis NOT DETECTED NOT DETECTED Final   Pseudomonas aeruginosa NOT DETECTED NOT DETECTED Final   Stenotrophomonas maltophilia NOT DETECTED NOT DETECTED Final   Candida albicans NOT DETECTED NOT DETECTED Final   Candida auris NOT DETECTED NOT DETECTED Final   Candida glabrata NOT DETECTED NOT DETECTED Final   Candida krusei NOT DETECTED NOT DETECTED Final   Candida parapsilosis NOT DETECTED NOT DETECTED Final   Candida tropicalis NOT DETECTED NOT DETECTED Final   Cryptococcus neoformans/gattii NOT DETECTED NOT DETECTED Final   Methicillin resistance mecA/C DETECTED (A) NOT DETECTED Final    Comment: CRITICAL RESULT CALLED TO, READ BACK BY AND VERIFIED WITH: J,GADHIA PHARMD @1050  01/18/22 EB Performed at [facility], [address] [place], [place] [address]      Studies: No results found.    Flora Lipps, MD  [facility] 01/18/2022  If 7PM-7AM, please contact night-coverage

## 2022-01-18 NOTE — Progress Notes (Signed)
PHARMACY - PHYSICIAN COMMUNICATION CRITICAL VALUE ALERT - BLOOD CULTURE IDENTIFICATION (BCID)  Sherry Hampton is an 66 y.o. female who presented to [facility] on 11/17/2021 with a chief complaint of acute abdominal pain.   Assessment: Found to have perforated viscus of the ascending colon with fecal peritonitis that required right colectomy and ileostomy 11/17/21. Also with UTI on 12/31/21. Treated with multiple courses of Zosyn during admission (most recently 01/01/22-01/10/22). Episode of fever on 01/16/22 PM. CXR showed no active disease. Urine culture with multiple species. No further spike in temperature since. WBC WNL. Blood cultures collected 01/17/22: 1/2 with GPC in clusters, BCID methicillin resistant Staph epidermidis.    Name of physician (or Provider) Contacted: Dr. Louanne Belton   Current antibiotics: none  Changes to prescribed antibiotics recommended:  Per MD, observe off antibiotics for now. Possible contaminant.   Results for orders placed or performed during the hospital encounter of 11/17/21  Blood Culture ID Panel (Reflexed) (Collected: 01/17/2022  7:06 AM)  Result Value Ref Range   Enterococcus faecalis NOT DETECTED NOT DETECTED   Enterococcus Faecium NOT DETECTED NOT DETECTED   Listeria monocytogenes NOT DETECTED NOT DETECTED   Staphylococcus species DETECTED (A) NOT DETECTED   Staphylococcus aureus (BCID) NOT DETECTED NOT DETECTED   Staphylococcus epidermidis DETECTED (A) NOT DETECTED   Staphylococcus lugdunensis NOT DETECTED NOT DETECTED   Streptococcus species NOT DETECTED NOT DETECTED   Streptococcus agalactiae NOT DETECTED NOT DETECTED   Streptococcus pneumoniae NOT DETECTED NOT DETECTED   Streptococcus pyogenes NOT DETECTED NOT DETECTED   A.calcoaceticus-baumannii NOT DETECTED NOT DETECTED   Bacteroides fragilis NOT DETECTED NOT DETECTED   Enterobacterales NOT DETECTED NOT DETECTED   Enterobacter cloacae complex NOT DETECTED NOT DETECTED   Escherichia coli NOT  DETECTED NOT DETECTED   Klebsiella aerogenes NOT DETECTED NOT DETECTED   Klebsiella oxytoca NOT DETECTED NOT DETECTED   Klebsiella pneumoniae NOT DETECTED NOT DETECTED   Proteus species NOT DETECTED NOT DETECTED   Salmonella species NOT DETECTED NOT DETECTED   Serratia marcescens NOT DETECTED NOT DETECTED   Haemophilus influenzae NOT DETECTED NOT DETECTED   Neisseria meningitidis NOT DETECTED NOT DETECTED   Pseudomonas aeruginosa NOT DETECTED NOT DETECTED   Stenotrophomonas maltophilia NOT DETECTED NOT DETECTED   Candida albicans NOT DETECTED NOT DETECTED   Candida auris NOT DETECTED NOT DETECTED   Candida glabrata NOT DETECTED NOT DETECTED   Candida krusei NOT DETECTED NOT DETECTED   Candida parapsilosis NOT DETECTED NOT DETECTED   Candida tropicalis NOT DETECTED NOT DETECTED   Cryptococcus neoformans/gattii NOT DETECTED NOT DETECTED   Methicillin resistance mecA/C DETECTED (A) NOT DETECTED    [name] 01/18/2022  11:32 AM

## 2022-01-18 NOTE — Progress Notes (Signed)
Daily Progress Note   Patient Name: Sherry Hampton       Date: 01/18/2022 DOB: [birth year]  Age: 66 y.o. MRN#: [id number] Attending Physician: Nolon Nations, MD Primary Care Physician: Pcp, No Admit Date: 11/17/2021  Reason for Consultation/Follow-up: Establishing goals of care  Subjective: I met today with patient's sister, Sherry Hampton.  I began encounter today by asking Sherry Hampton to talk with me about her understanding of the situation.  Sherry Hampton proceeded to relay long account of family's perception that they have not been adequately updated by the care team throughout her long admission and have concerns about the care she has received throughout.  She often came back to asking, " what would everyone here do if this were their sister?"  She stated multiple times that she is, "just stupid" and just needs things put in a simple manner.  Conversation difficult to redirect at times.  I attempted to redirect conversation by framing her current situation in terms of concerns about her nutrition, cognition, and functional status.  We discussed that all 3 of these are significantly compromised.  We also discussed that the most pressing of these is her current nutrition status.  I relayed concern that she is currently without a source of nutrition and natural progression of this will be death in a short period of time if artificial nutrition and hydration are not started.  We discussed the fact she has a functioning GI tract and therefore long-term nutrition would be via PEG tube.  Sherry Hampton then asked why that has not been done already.  Discussed with her that be we have been working to obtain consent from family if they believe long-term feeding via PEG tube is what Sherry Hampton would want moving forward.  If family consents,  plan will be for G tube placement.  Sherry Hampton then stated that that is a, "doctor's decision" and should not be placed on family.  I then asked her to help me understand what she has been told about likely scenarios moving forward.  She states that she was told that options are to place tube or make her sister comfortable.  I affirmed her that this is essentially where we are in her care plan.  I asked if she and family thought that her sisters wish, if she were to understand her situation, would be  to focus on comfort moving forward.  She stated, "no, I don't.  That is giving up and I do not think she would want to give up."  I then told Sherry Hampton that if family did not think a transition to a focus on comfort would be her sisters desire (with understanding that she is likely to remain severely debilitated near her current state long-term), then my recommendation would be to proceed with PEG tube placement as this is realistically the only long-term option moving forward.  Sherry Hampton then stated, "Okay.  I like how you said that.  We should do that."  We then discussed logistics of g tube placement.  I then attempted to call other family to discuss but was not able to reach them.  I have left messages including for her brother, Sherry Hampton, who had been noted to be family representative earlier this admission.  Length of Stay: 27  Current Medications: Scheduled Meds:   chlorhexidine  15 mL Mouth Rinse BID   Chlorhexidine Gluconate Cloth  6 each Topical Daily   enoxaparin (LOVENOX) injection  40 mg Subcutaneous Q24H   feeding supplement  237 mL Oral TID BM   ferrous sulfate  325 mg Oral BID WC   folic acid  1 mg Oral Daily   mouth rinse  15 mL Mouth Rinse q12n4p   multivitamin with minerals  1 tablet Oral Daily   niacin  100 mg Oral QHS   nutrition supplement (JUVEN)  1 packet Oral BID BM   pantoprazole  40 mg Oral QHS   potassium chloride  20 mEq Oral BID   psyllium  1 packet Oral BID   vitamin B-6  100  mg Oral Daily   sodium chloride flush  10-40 mL Intracatheter Q12H   thiamine  100 mg Oral BID    Continuous Infusions:  dextrose 5 % and 0.9 % NaCl with KCl 20 mEq/L 50 mL/hr at 01/17/22 1659    PRN Meds: ibuprofen, ibuprofen, lip balm, ondansetron **OR** ondansetron (ZOFRAN) IV, oxyCODONE, sodium chloride flush  Physical Exam    General: Alert, awake, in no acute distress.  Confused and not able to participate in conversation HEENT: No bruits, no goiter, no JVD Heart: Regular rate and rhythm. No murmur appreciated. Lungs: Good air movement, clear Abdomen: Soft, nontender, nondistended Skin: Warm and dry  Vital Signs: BP 115/71 (BP Location: Right Arm)    Pulse 93    Temp (!) 97.5 F (36.4 C) (Oral)    Resp 16    Ht '5\' 10"'  (1.778 m)    Wt 73.3 kg    SpO2 100%    BMI 23.19 kg/m  SpO2: SpO2: 100 % O2 Device: O2 Device: Room Air O2 Flow Rate: O2 Flow Rate (L/min): 2 L/min  Intake/output summary:  Intake/Output Summary (Last 24 hours) at 01/18/2022 0814 Last data filed at 01/18/2022 1610 Gross per 24 hour  Intake 743.24 ml  Output 850 ml  Net -106.76 ml    LBM: Last BM Date: 01/17/22 Baseline Weight: Weight: 79.8 kg Most recent weight: Weight: 73.3 kg       Palliative Assessment/Data:      Patient Active Problem List   Diagnosis Date Noted   Goals of care, counseling/discussion 01/07/2022   Vitamin B6 deficiency 01/07/2022   Folate deficiency 01/02/2022   Common bile duct dilatation 01/02/2022   Enterococcus UTI 01/02/2022   Hypercalcemia 12/31/2021   Abnormal neurological exam 12/28/2021   Memory loss 12/28/2021   Necrosis of colon  with perforation  12/19/2021   Fatigue 12/19/2021   Joint pain 12/19/2021   Obesity 12/19/2021   Osteoarthritis of knee 12/19/2021   Other long term (current) drug therapy 12/19/2021   Ileostomy in place [facility]) 12/19/2021   Delirium    Protein-calorie malnutrition, severe 11/26/2021   GERD (gastroesophageal reflux disease)     Hyperlipidemia    Hyponatremia    Acute blood loss anemia    Acute metabolic encephalopathy    Perforated abdominal viscus 11/17/2021   Fungal infection of toenail 10/18/2015   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  She has had a complicated hospital course and at this point is not taking in oral nutrition or hydration.  Was previously on TPN but this was discontinued and family currently being counseled on necessity for G-tube versus recommendation for focusing on her comfort moving forward.  Recommendations/Plan: Full code/full scope Her family continues to struggle with surrogate decision making on her behalf.  I had a very long conversation with her sister today and when the question was asked in a very simple manner if comfort would be her sister's goal moving forward, she clearly stated she did not think this would be her wish.  With this in mind, we discussed the recommended medical intervention would be for G-tube placement as we currently have no source of nutrition for her.  Her sister agreed at that point. As has been previously noted, legally speaking, the majority of her reasonably available 6 siblings would be joint surrogate decision makers on her behalf as she does not have advanced directive naming healthcare power of attorney/surrogate.  I spoke with her sister, Sherry Hampton, at the bedside today who at the end of her conversation noted agreement with placement of PEG tube.  I attempted to call her brother Sherry Hampton, with whom hospital staff has had multiple conversations in the past and left him a message.  I also attempted other  numbers given by sister for siblings, Sherry Hampton and Sherry Hampton.  I did not reach any of them today.  I am planning to follow-up again with [facility] tomorrow and will also attempt to reach out to family again.  It would be my opinion, if other family does not return calls, that any siblings who are returning calls and participating (currently only Sherry Hampton who is at the bedside) would be the readily available surrogates and I would move forward with their consent for G-tube placement.  Once I determine how many siblings I can reach, I will touch base again with ethics and administration to ensure we are all in agreement that we have done due diligence to give opportunity for other siblings to participate in decision-making on her behalf.  If they cannot be reached, I would recommend proceeding with G-tube placement if siblings we can reach agree this would be her wish moving forward.  Goals of Care and Additional Recommendations: Limitations on Scope of Treatment: Full Scope Treatment  Code Status:    Code Status Orders  (From admission, onward)           Start     Ordered   11/17/21 1335  Full code  Continuous  11/17/21 1337           Code Status History     This patient has a current code status but no historical code status.       Prognosis: Guarded/poor  Discharge Planning: To Be Determined  Care plan was discussed with IDT  Thank you for allowing the Palliative Medicine Team to assist in the care of this patient.   Time In: 1100 Time Out: 1230 Total Time 90 Prolonged Time Billed Yes   Micheline Rough, MD  Please contact Palliative Medicine Team phone at [phone] for questions and concerns.

## 2022-01-18 NOTE — Progress Notes (Signed)
Progress Note  62 Days Post-Op  Subjective: Pt resting comfortably in bed.  Temperature curve down.   Objective: Vital signs in last 24 hours: Temp:  [97.5 F (36.4 C)-99.3 F (37.4 C)] 97.5 F (36.4 C) (01/28 0700) Pulse Rate:  [81-95] 93 (01/28 0700) Resp:  [16-18] 16 (01/28 0700) BP: (112-129)/(58-75) 115/71 (01/28 0700) SpO2:  [99 %-100 %] 100 % (01/28 0700) Last BM Date: 01/17/22  Intake/Output from previous day: 01/27 0701 - 01/28 0700 In: 743.2 [P.O.:120; I.V.:623.2] Out: 850 [Urine:550; Stool:300] Intake/Output this shift: No intake/output data recorded.  PE: General: resting, NAD Heart: regular Resp: normal work of breathing Abdomen: soft, nondistended, nontender to palpation. Dressing intact. Ileostomy productive. 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  SCDs in place   Lab Results:  Recent Labs    01/16/22 0341 01/18/22 0449  WBC 5.9 6.9  HGB 8.2* 9.2*  HCT 27.2* 31.4*  PLT 306 308    BMET Recent Labs    01/18/22 0449  NA 139  K 3.7  CL 109  CO2 26  GLUCOSE 109*  BUN 14  CREATININE 0.37*  CALCIUM 10.0   PT/INR No results for input(s): LABPROT, INR in the last 72 hours. CMP     Component Value Date/Time   NA 139 01/18/2022 0449   K 3.7 01/18/2022 0449   CL 109 01/18/2022 0449   CO2 26 01/18/2022 0449   GLUCOSE 109 (H) 01/18/2022 0449   BUN 14 01/18/2022 0449   CREATININE 0.37 (L) 01/18/2022 0449   CALCIUM 10.0 01/18/2022 0449   CALCIUM 10.9 (H) 01/01/2022 0449   PROT 6.7 01/15/2022 0340   ALBUMIN <1.5 (L) 01/15/2022 0340   AST 17 01/15/2022 0340   ALT 34 01/15/2022 0340   ALKPHOS 82 01/15/2022 0340   BILITOT 0.6 01/15/2022 0340   GFRNONAA >60 01/18/2022 0449   Lipase     Component Value Date/Time   LIPASE 22 11/17/2021 0955       Studies/Results: No results found.  Anti-infectives: Anti-infectives (From admission, onward)    Start     Dose/Rate Route  Frequency Ordered Stop   01/01/22 2100  piperacillin-tazobactam (ZOSYN) IVPB 3.375 g  Status:  Discontinued        3.375 g 12.5 mL/hr over 240 Minutes Intravenous Every 8 hours 01/01/22 2042 01/10/22 1333   11/25/21 1000  piperacillin-tazobactam (ZOSYN) IVPB 3.375 g  Status:  Discontinued        3.375 g 12.5 mL/hr over 240 Minutes Intravenous Every 8 hours 11/25/21 0909 12/01/21 0822   11/17/21 1830  piperacillin-tazobactam (ZOSYN) IVPB 3.375 g        3.375 g 12.5 mL/hr over 240 Minutes Intravenous Every 8 hours 11/17/21 1415 11/22/21 2359   11/17/21 1230  piperacillin-tazobactam (ZOSYN) IVPB 3.375 g        3.375 g 100 mL/hr over 30 Minutes Intravenous  Once 11/17/21 1220 11/17/21 1304        Assessment/Plan 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 Harlow Asa 11/27 - path: necrotic colon at site of perforation, no malignancy or dysplasia noted  - Ileostomy output improved. Continue BID metamucil.  - WOC following for ileostomy, mucous fistula - Wound with blue-green, musty smelling drainage 1/11 - Dakin's TID x72 hrs. Wound probed and deeper tract opened after CT 1/11 with return of purulent drainage.   - upon DC of Dakin's  would like TID dry dressing to help absorb drainage  - Pathology benign. CT distal descending/proximal sigmoid bowel luminal collapse. - CT 1/11 - some extravasated oral contrast in the subcutaneous abdominal wall superior and lateral to the ostomy at this level. There is inflammatory tissue at the level of the ostomy extending superiorly abutting the inferior tip of the liver and fundus of the gallbladder similar to the prior examination. There is likely some early fluid collection development anterior to the right kidney image 7/31 measuring 1.9 by 1.4 by 3.7 cm. Previously described fluid collections near the ostomy are not as well defined on the current study. Ill-defined omental  inflammation and fluid in the lower abdomen and left upper quadrant appears unchanged and may be related to omental infarct. There is no free intraperitoneal air or ascites. Small air-fluid collection at the level of the umbilicus is again seen containing air. This collection measures 2.5 x 0.7 by 1.3 cm and has mildly increased in size. Air in the collection is new from prior. - no collection around the ileostomy that is drainable - the best way to try to resolve this is abx and improving nutrition -zosyn completed for last CT scan findings and UTI on 1/20 -Dr. Hassell Done stopped her TNA on 1/17 -at this time, the patient has no medical need for TNA as she has a functional GI tract.  Parenteral nutrition carries many long-term risks  and enteral nutrition is superior; therefore, oral intake or intake via other means such as g-tube, would be needed henceforth.    -palliative saw the patient and it sounds like she is good with a feeding tube and thinks that is what her sister would want  Temp curve improved.  Cultures pending.      FEN - Dysphagia 2 diet/but won't eat   VTE - SCDs, Lovenox ID - zosyn 11/27>12/2, 12/5>12/11, restarted 1/11>> 1/20 Foley - None currently    - encephalopathy/FTT - appreciate consultation at request of Dr. Johnnye Sima. Neurology saw a few weeks ago and signed off. On folic acid and thiamine supplementation. Family requesting further neurologic testing - appreciate Neuro seeing again. Appreciate TRH following as well.   - psych saw 12/27 and determined patient does not have capacity   - frequent urination - Urine Cx with enterococcus faecalis sensitive to ampicillin, Zosyn completed   Dispo: unclear at this time   LOS: 62 days   I reviewed nursing notes, hospitalist notes, last 24 h vitals and pain scores, last 48 h intake and output, and last 24 h labs and trends.    Stark Klein, [facility] 01/18/2022, 10:09 AM Please see Amion for pager number  during day hours 7:00am-4:30pm

## 2022-01-19 NOTE — Progress Notes (Signed)
Patient unable/refusing to swallow. Noted patient holding/pocketing dysphagia food and crushed med's in mouth.  Positioned patient upright at 90 angle to attempt to admin  crushed and liquid medications. Unable to administer. Patient refusing drinks and dysphagia diet at this time. Slow responses noted .Patient did verbalize "No" with  med admin attempt.   Family member at the bedside and made aware.  Oral mouth care was administered. Noted  B/L LE 's swollen and Foot drop? Elevated B/L LE's , SCD's replaced.

## 2022-01-19 NOTE — Progress Notes (Signed)
PROGRESS NOTE  Sherry Hampton ZES:[id number] DOB: [birth year] DOA: 11/17/2021 PCP: Pcp, No   LOS: 78 days   Brief narrative:  66 years old female with history of hyperlipidemia and mild memory loss presented to hospital with acute abnormal pain.  She was noted to have perforated viscus of the ascending colon with fecal peritonitis and required right colectomy and ileostomy.  Hospital course was complicated by fluid collection in the abdomen malnutrition progressive decline and encephalopathy.  TRH was consulted for assistance with general medical management.  Assessment/Plan:  Principal Problem:   Perforated abdominal viscus Active Problems:   GERD (gastroesophageal reflux disease)   Hyperlipidemia   Hyponatremia   Acute blood loss anemia   Acute metabolic encephalopathy   Protein-calorie malnutrition, severe   Delirium   Necrosis of colon with perforation    Ileostomy in place [facility])   Abnormal neurological exam   Memory loss   Hypercalcemia   Folate deficiency   Common bile duct dilatation   Enterococcus UTI   Goals of care, counseling/discussion   Vitamin B6 deficiency   Perforated abdominal viscus  Present on admission.  Status post exploratory laparotomy with left colectomy and end ileostomy.  Primary care team on board.  Wound care on board.   Enterococcus UTI - resolved . Completed course of antibiotic recently.  Episode of fever. Temp max of 100.2 F.  Urinalysis less than 5 white cells and urine culture with multiple species.  Blood culture showing MRSA positive staph epidermidis in 1 bottle.  Could be contaminant.  Chest x-ray done on 01/16/2022 showed no evidence of infiltrate.  We will need to continue to monitor.  Status post PICC line removal.  Patient did have recent antibiotics last dose on 01/10/2022.  I spoke with Dr. Candiss Norse infectious disease today due to findings of methicillin-resistant staph epidermidis in 1 bottle.  She recommended repeating blood  culture.  Mild transaminitis Has improved we will check CMP in AM.   Acute metabolic encephalopathy Improved.  MRI brain 12/11/21 without acute findings and repeat 01/05/22 again without acute findings.  EEG only noted slowing consistent with encephalopathy.  Neurology was consulted during hospitalization.  Continue supportive care.   Vitamin B6 deficiency.  Continue supplementation.   Folic acid deficiency Continue supplementation    Hypercalcemia Latest calcium level of 10.0 .  PTH related peptide was less than 2.   Hypokalemia Improved after replacement.  Latest potassium 3.7   Hypomagnesemia Improved after replacement.  Latest magnesium of 1.7.   Severe protein calorie malnutrition, failure to thrive, poor oral intake Primary team has recommended PEG to family, palliative care is discussing as well.  Goals of care.  Palliative care  on board.  DVT prophylaxis: enoxaparin (LOVENOX) injection 40 mg Start: 11/18/21 0800 SCDs Start: 11/17/21 1336   Code Status: Full code  Status is: Inpatient  Procedures: Status post exploratory laparotomy with right colectomy and ileostomy on 11/17/2021  Anti-infectives:  None  Anti-infectives (From admission, onward)    Start     Dose/Rate Route Frequency Ordered Stop   01/01/22 2100  piperacillin-tazobactam (ZOSYN) IVPB 3.375 g  Status:  Discontinued        3.375 g 12.5 mL/hr over 240 Minutes Intravenous Every 8 hours 01/01/22 2042 01/10/22 1333   11/25/21 1000  piperacillin-tazobactam (ZOSYN) IVPB 3.375 g  Status:  Discontinued        3.375 g 12.5 mL/hr over 240 Minutes Intravenous Every 8 hours 11/25/21 0909 12/01/21 0822   11/17/21 1830  piperacillin-tazobactam (  ZOSYN) IVPB 3.375 g        3.375 g 12.5 mL/hr over 240 Minutes Intravenous Every 8 hours 11/17/21 1415 11/22/21 2359   11/17/21 1230  piperacillin-tazobactam (ZOSYN) IVPB 3.375 g        3.375 g 100 mL/hr over 30 Minutes Intravenous  Once 11/17/21 1220 11/17/21 1304        Subjective: Today, patient was seen and examined at bedside.  Communicated.  Complains of mild pain.  Denies any shortness of breath, nausea, vomiting.  T-max of 100.3 degrees noted.  Objective: Vitals:   01/18/22 2250 01/19/22 0457  BP: (!) 86/56 112/79  Pulse: 91 85  Resp: 15 18  Temp: 100.2 F (37.9 C) (!) 97.4 F (36.3 C)  SpO2: 93% 100%    Intake/Output Summary (Last 24 hours) at 01/19/2022 1040 Last data filed at 01/19/2022 1000 Gross per 24 hour  Intake 1077.64 ml  Output 855 ml  Net 222.64 ml    Filed Weights   12/17/21 0500 12/24/21 0500 12/30/21 0635  Weight: 76.5 kg 77.9 kg 73.3 kg   Body mass index is 23.19 kg/m.   Physical Exam: General:  Average built, not in obvious distress, appears weak and deconditioned, verbal HENT:   No scleral pallor or icterus noted. Oral mucosa is moist.  Chest:   Diminished breath sounds bilaterally.  CVS: S1 &S2 heard. No murmur.  Regular rate and rhythm. Abdomen: Midline incision, ileostomy, mucous fistula.   Extremities: No cyanosis, clubbing trace edema.  Peripheral pulses are palpable. Psych: Alert, awake and communicative, slow to respond, CNS: Moving extremities, weak and deconditioned. Skin: Warm and dry.     Data Review: I have personally reviewed the following laboratory data and studies,  CBC: Recent Labs  Lab 01/15/22 0340 01/16/22 0341 01/18/22 0449  WBC 6.1 5.9 6.9  HGB 8.0* 8.2* 9.2*  HCT 26.6* 27.2* 31.4*  MCV 87.5 87.7 89.5  PLT 281 306 283    Basic Metabolic Panel: Recent Labs  Lab 01/13/22 0352 01/14/22 0423 01/15/22 0340 01/18/22 0449  NA 134* 135 135 139  K 3.3* 3.5 3.6 3.7  CL 109 107 107 109  CO2 24 24 25 26   GLUCOSE 106* 119* 107* 109*  BUN 5* 8 8 14   CREATININE 0.38* 0.32* <0.30* 0.37*  CALCIUM 9.1 9.3 9.4 10.0  MG  --   --  1.7  --     Liver Function Tests: Recent Labs  Lab 01/13/22 0352 01/15/22 0340  AST 32 17  ALT 66* 34  ALKPHOS 109 82  BILITOT 0.5 0.6   PROT 6.6 6.7  ALBUMIN 1.6* <1.5*    No results for input(s): LIPASE, AMYLASE in the last 168 hours. No results for input(s): AMMONIA in the last 168 hours. Cardiac Enzymes: No results for input(s): CKTOTAL, CKMB, CKMBINDEX, TROPONINI in the last 168 hours. BNP (last 3 results) No results for input(s): BNP in the last 8760 hours.  ProBNP (last 3 results) No results for input(s): PROBNP in the last 8760 hours.  CBG: No results for input(s): GLUCAP in the last 168 hours.  Recent Results (from the past 240 hour(s))  Urine Culture     Status: Abnormal   Collection Time: 01/16/22  5:44 PM   Specimen: Urine, Clean Catch  Result Value Ref Range Status   Specimen Description   Final    URINE, CLEAN CATCH Performed at [facility], [place] [address], [place], [place] [address]    Special Requests  Final    NONE Performed at [facility], [place] [address] [place], [place] [address]    Culture MULTIPLE SPECIES PRESENT, SUGGEST RECOLLECTION (A)  Final   Report Status 01/18/2022 FINAL  Final  Culture, blood (Routine X 2) w Reflex to ID Panel     Status: None (Preliminary result)   Collection Time: 01/17/22  6:40 AM   Specimen: BLOOD LEFT HAND  Result Value Ref Range Status   Specimen Description   Final    BLOOD LEFT HAND Performed at [facility] [address] [place], [place] [address]    Special Requests   Final    BOTTLES DRAWN AEROBIC ONLY Blood Culture results may not be optimal due to an inadequate volume of blood received in culture bottles Performed at [facility] [address] [place], [place] [address]    Culture   Final    NO GROWTH 2 DAYS Performed at [facility] [address], [place], [place] [address]    Report Status PENDING  Incomplete  Culture, blood (Routine X 2) w Reflex to ID Panel     Status: Abnormal (Preliminary result)   Collection Time: 01/17/22  7:06 AM    Specimen: BLOOD  Result Value Ref Range Status   Specimen Description   Final    BLOOD RIGHT WRIST Performed at [facility] [address], [place], [place] [address]    Special Requests   Final    BOTTLES DRAWN AEROBIC ONLY Blood Culture results may not be optimal due to an inadequate volume of blood received in culture bottles Performed at [facility] [address], [place], [place] [address]    Culture  Setup Time   Final    GRAM POSITIVE COCCI IN CLUSTERS AEROBIC BOTTLE ONLY CRITICAL RESULT CALLED TO, READ BACK BY AND VERIFIED WITH: J,GADHIA PHARMD @1050  01/18/22 EB    Culture (A)  Final    STAPHYLOCOCCUS EPIDERMIDIS CULTURE REINCUBATED FOR BETTER GROWTH Performed at [facility], [address], [place], [place] [address]    Report Status PENDING  Incomplete  Blood Culture ID Panel (Reflexed)     Status: Abnormal   Collection Time: 01/17/22  7:06 AM  Result Value Ref Range Status   Enterococcus faecalis NOT DETECTED NOT DETECTED Final   Enterococcus Faecium NOT DETECTED NOT DETECTED Final   Listeria monocytogenes NOT DETECTED NOT DETECTED Final   Staphylococcus species DETECTED (A) NOT DETECTED Final    Comment: CRITICAL RESULT CALLED TO, READ BACK BY AND VERIFIED WITH: J,GADHIA PHARMD @1050  01/18/22 EB    Staphylococcus aureus (BCID) NOT DETECTED NOT DETECTED Final   Staphylococcus epidermidis DETECTED (A) NOT DETECTED Final    Comment: Methicillin (oxacillin) resistant coagulase negative staphylococcus. Possible blood culture contaminant (unless isolated from more than one blood culture draw or clinical case suggests pathogenicity). No antibiotic treatment is indicated for blood  culture contaminants. CRITICAL RESULT CALLED TO, READ BACK BY AND VERIFIED WITH: J,GADHIA PHARMD @1050  01/18/22 EB    Staphylococcus lugdunensis NOT DETECTED NOT DETECTED Final   Streptococcus species NOT DETECTED NOT DETECTED Final   Streptococcus  agalactiae NOT DETECTED NOT DETECTED Final   Streptococcus pneumoniae NOT DETECTED NOT DETECTED Final   Streptococcus pyogenes NOT DETECTED NOT DETECTED Final   A.calcoaceticus-baumannii NOT DETECTED NOT DETECTED Final   Bacteroides fragilis NOT DETECTED NOT DETECTED Final   Enterobacterales NOT DETECTED NOT DETECTED Final   Enterobacter cloacae complex NOT  DETECTED NOT DETECTED Final   Escherichia coli NOT DETECTED NOT DETECTED Final   Klebsiella aerogenes NOT DETECTED NOT DETECTED Final   Klebsiella oxytoca NOT DETECTED NOT DETECTED Final   Klebsiella pneumoniae NOT DETECTED NOT DETECTED Final   Proteus species NOT DETECTED NOT DETECTED Final   Salmonella species NOT DETECTED NOT DETECTED Final   Serratia marcescens NOT DETECTED NOT DETECTED Final   Haemophilus influenzae NOT DETECTED NOT DETECTED Final   Neisseria meningitidis NOT DETECTED NOT DETECTED Final   Pseudomonas aeruginosa NOT DETECTED NOT DETECTED Final   Stenotrophomonas maltophilia NOT DETECTED NOT DETECTED Final   Candida albicans NOT DETECTED NOT DETECTED Final   Candida auris NOT DETECTED NOT DETECTED Final   Candida glabrata NOT DETECTED NOT DETECTED Final   Candida krusei NOT DETECTED NOT DETECTED Final   Candida parapsilosis NOT DETECTED NOT DETECTED Final   Candida tropicalis NOT DETECTED NOT DETECTED Final   Cryptococcus neoformans/gattii NOT DETECTED NOT DETECTED Final   Methicillin resistance mecA/C DETECTED (A) NOT DETECTED Final    Comment: CRITICAL RESULT CALLED TO, READ BACK BY AND VERIFIED WITH: J,GADHIA PHARMD @1050  01/18/22 EB Performed at [facility], [address], [place],  [address]      Studies: No results found.    Flora Lipps, MD  [facility] 01/19/2022  If 7PM-7AM, please contact night-coverage

## 2022-01-19 NOTE — Plan of Care (Signed)
°  Problem: Health Behavior/Discharge Planning: Goal: Ability to manage health-related needs will improve Outcome: Not Progressing   Problem: Clinical Measurements: Goal: Ability to maintain clinical measurements within normal limits will improve Outcome: Not Progressing Goal: Will remain free from infection Outcome: Not Progressing Goal: Diagnostic test results will improve Outcome: Not Progressing Goal: Respiratory complications will improve Outcome: Not Progressing Goal: Cardiovascular complication will be avoided Outcome: Not Progressing   Problem: Activity: Goal: Risk for activity intolerance will decrease Outcome: Not Progressing   Problem: Nutrition: Goal: Adequate nutrition will be maintained Outcome: Not Progressing   Problem: Coping: Goal: Level of anxiety will decrease Outcome: Not Progressing   Problem: Elimination: Goal: Will not experience complications related to bowel motility Outcome: Not Progressing   Problem: Pain Managment: Goal: General experience of comfort will improve Outcome: Not Progressing   Problem: Safety: Goal: Ability to remain free from injury will improve Outcome: Not Progressing   Problem: Skin Integrity: Goal: Risk for impaired skin integrity will decrease Outcome: Not Progressing   Problem: Education: Goal: Required Educational Video(s) Outcome: Not Progressing   Problem: Clinical Measurements: Goal: Ability to maintain clinical measurements within normal limits will improve Outcome: Not Progressing Goal: Postoperative complications will be avoided or minimized Outcome: Not Progressing

## 2022-01-19 NOTE — Progress Notes (Signed)
Daily Progress Note   Patient Name: Sherry Hampton       Date: 01/19/2022 DOB: May 13, 1956  Age: 66 y.o. MRN#: [id number] Attending Physician: Nolon Nations, MD Primary Care Physician: Pcp, No Admit Date: 11/17/2021  Reason for Consultation/Follow-up: Establishing goals of care  Subjective: Sherry Hampton is awake and tracks me in the room.  She attempts to answer simple question but mumbles and I cannot really understand her.  She is not in a position participate in goals of care conversation.  I met today at the bedside with her sister, Sherry Hampton.  Attempted to clarify with Sherry Hampton regarding her understanding of our conversation yesterday and care plan moving forward.  Sherry Hampton continues to focus on what she views as lack of consistency in messaging family has received since patient's admission.  She asked me again today why it has taken 60 days to come to a point of recommending a PEG tube.  She stated, "nutrition is a basic thing.  You are doctors.  You should know this."  I attempted to clarify that she had been receiving nutrition via different routes (oral intake-but minimal, NG tube, TPN) but these were all not viable long-term options which is why there is current question of if long-term nutrition should be pursued via PEG tube.  She again expressed that it is, "not fair" to ask family to decide these things.  Then, as we did yesterday, Sherry Hampton and I had discussion of what family's end goals would be for Sherry Hampton.  Sherry Hampton expressed that their hope is that she can transition from the hospital to a rehab facility locally and then eventually be strong enough that family can take her back to [place] where they can care for her.  I again clarified with Sherry Hampton that I thought the reality was that Sherry Hampton  may remain in a severely debilitated state for the rest of her natural life.  She expressed that if she gets strong enough to get out of the hospital and be able to be transported to [place], there are multiple family members including nieces who work in healthcare (phlebotomists and CNA's) who would be able to help with her care long-term.  We then discussed that family is not in a place where comfort is the goal moving forward.  We discussed that she will progress to  a natural death in the near future if she does not have consistent source of nutrition.  Hearing this again upsets Sherry Hampton.  I shared with her that, if family goal is for her to get to rehab, and family is clearly not in a place where they want to focus on comfort, the next medically recommended step would be placement of PEG tube.  She then asked why this has not been done already.  I discussed with her about obtaining consent from family prior to procedure.  She stated, "but it should have been done already."  I then asked if she felt this is something that we should proceed forward with and she stated, "yes."  I shared with her that I had tried to reach other family members including her brothers without success.  She reports that family is very distressed with everything that has been going on and " they need some time to take care of themselves."  It has been consistent through her admission that family has wanted aggressive interventions.  Additionally, both patient's brother, Sherry Hampton, and another sister, Sherry Hampton, were present for conversation with my partner, Osborne Oman on 12/31.  During that encounter, they both agreed that family would want PEG tube if this were the only route available for Sherry Hampton to receive nutrition.  Length of Stay: 85  Current Medications: Scheduled Meds:   chlorhexidine  15 mL Mouth Rinse BID   enoxaparin (LOVENOX) injection  40 mg Subcutaneous Q24H   feeding supplement  237 mL Oral TID BM   ferrous  sulfate  325 mg Oral BID WC   folic acid  1 mg Oral Daily   mouth rinse  15 mL Mouth Rinse q12n4p   multivitamin with minerals  1 tablet Oral Daily   niacin  100 mg Oral QHS   nutrition supplement (JUVEN)  1 packet Oral BID BM   pantoprazole  40 mg Oral QHS   potassium chloride  20 mEq Oral BID   psyllium  1 packet Oral BID   vitamin B-6  100 mg Oral Daily   sodium chloride flush  10-40 mL Intracatheter Q12H   thiamine  100 mg Oral BID    Continuous Infusions:  dextrose 5 % and 0.9 % NaCl with KCl 20 mEq/L 50 mL/hr at 01/19/22 0323    PRN Meds: ibuprofen, ibuprofen, lip balm, ondansetron **OR** ondansetron (ZOFRAN) IV, oxyCODONE, sodium chloride flush  Physical Exam    General: Alert, awake, in no acute distress.  Confused and not able to participate in conversation HEENT: No bruits, no goiter, no JVD Heart: Regular rate and rhythm. No murmur appreciated. Lungs: Good air movement, clear Abdomen: Soft, nontender, nondistended Skin: Warm and dry  Vital Signs: BP 112/79 (BP Location: Left Wrist)    Pulse 85    Temp (!) 97.4 F (36.3 C) (Axillary)    Resp 18    Ht _0  (1.778 m)    Wt 73.3 kg    SpO2 100%    BMI 23.19 kg/m  SpO2: SpO2: 100 % O2 Device: O2 Device: Room Air O2 Flow Rate: O2 Flow Rate (L/min): 2 L/min  Intake/output summary:  Intake/Output Summary (Last 24 hours) at 01/19/2022 0757 Last data filed at 01/19/2022 0600 Gross per 24 hour  Intake 1287.64 ml  Output 605 ml  Net 682.64 ml    LBM: Last BM Date: 01/18/22 Baseline Weight: Weight: 79.8 kg Most recent weight: Weight: 73.3 kg       Palliative Assessment/Data:  Patient Active Problem List   Diagnosis Date Noted   Goals of care, counseling/discussion 01/07/2022   Vitamin B6 deficiency 01/07/2022   Folate deficiency 01/02/2022   Common bile duct dilatation 01/02/2022   Enterococcus UTI 01/02/2022   Hypercalcemia 12/31/2021   Abnormal neurological exam 12/28/2021   Memory loss 12/28/2021    Necrosis of colon with perforation  12/19/2021   Fatigue 12/19/2021   Joint pain 12/19/2021   Obesity 12/19/2021   Osteoarthritis of knee 12/19/2021   Other long term (current) drug therapy 12/19/2021   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   Delirium    Protein-calorie malnutrition, severe 11/26/2021   GERD (gastroesophageal reflux disease)    Hyperlipidemia    Hyponatremia    Acute blood loss anemia    Acute metabolic encephalopathy    Perforated abdominal viscus 11/17/2021   Fungal infection of toenail 10/18/2015   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   Avitaminosis D 07/10/2015   CN (constipation) 01/11/2015   Hemorrhoids, internal 01/11/2015   Rheumatoid arthritis ([place]) 01/04/2013    Palliative Care Assessment & Plan   Patient Profile: 66 year old female with history of hyperlipidemia and mild memory loss who presented to the hospital with perforated viscus and is status post right colectomy and ileostomy.  She has had a complicated hospital course and at this point is not taking in oral nutrition or hydration.  Was previously on TPN but this was discontinued and family currently being counseled on necessity for G-tube versus recommendation for focusing on her comfort moving forward.  Recommendations/Plan: Full code/full scope This remains a difficult situation as family remains upset about her care plan overall.  They continue to struggle with surrogate decision making on her behalf.  At this point, her sister, Sherry Hampton, is the only one who is returning calls and is available to help in surrogate decision making.  She has been in discussion with her siblings, however, she reports siblings are overwhelmed with the situation. Sherry Hampton is looking for concrete recommendations from patient's medical care team regarding what next steps would be.  In talking with her about goals, it is very apparent that no one in her family feels that Ms. Wingate would want to focus on comfort if she were to  understand her situation.  She has very limited potential for long-term regaining of function, however, family feels this is something that she " deserves a chance" to see how much functional status she can regain. As family goal is for continued attempts at aggressive care and family is clear that comfort is NOT the goal, we should proceed with placement of PEG tube.  Family has been expressing wishes consistent with long-term artificial nutrition and hydration, however, they become overwhelmed when asked specifically and repeatedly if PEG tube should be placed.  Clearly, PEG tube is most in line with the goals that they express (discharge from hospital to rehab and eventual transition back to [place]). As has been previously noted, legally speaking, the majority of her reasonably available 6 siblings would be joint surrogate decision makers on her behalf as she does not have advanced directive naming healthcare power of attorney/surrogate.  I spoke with her sister, Sherry Hampton, at the bedside today who at the end of her conversation noted agreement with placement of PEG tube.  I have attempted to call her siblings but have not been able to reach them.  I will touch base again with ethics and administration to ensure we are all in agreement that we have done due diligence  to give opportunity for other siblings to participate in decision-making on her behalf.    Goals of Care and Additional Recommendations: Limitations on Scope of Treatment: Full Scope Treatment  Code Status:    Code Status Orders  (From admission, onward)           Start     Ordered   11/17/21 1335  Full code  Continuous        11/17/21 1337           Code Status History     This patient has a current code status but no historical code status.       Prognosis: Guarded/poor  Discharge Planning: To Be Determined  Care plan was discussed with IDT  Thank you for allowing the Palliative Medicine Team to assist in  the care of this patient.   Time In: 1300 Time Out: 1350 Total Time 50 Prolonged Time Billed No   Thaila Bottoms Domingo Cocking, MD  Please contact Palliative Medicine Team phone at [phone] for questions and concerns.

## 2022-01-19 NOTE — Progress Notes (Signed)
Patient has refused medications, only agreed for chlohexedine mouth swab, off loaded right butt and patient screamed at this nurse. Educated her on importance of repositioning.

## 2022-01-19 NOTE — Progress Notes (Signed)
Progress Note  63 Days Post-Op  Subjective: No significant change.  T max 100.2  Objective: Vital signs in last 24 hours: Temp:  [97.4 F (36.3 C)-100.2 F (37.9 C)] 97.4 F (36.3 C) (01/29 0457) Pulse Rate:  [85-91] 85 (01/29 0457) Resp:  [14-18] 18 (01/29 0457) BP: (86-121)/(56-79) 112/79 (01/29 0457) SpO2:  [93 %-100 %] 100 % (01/29 0457) Last BM Date: 01/18/22  Intake/Output from previous day: 01/28 0701 - 01/29 0700 In: 1287.6 [P.O.:270; I.V.:1017.6] Out: 605 [Urine:380; Stool:225] Intake/Output this shift: No intake/output data recorded.  PE: General: resting, NAD, interactive and answers questions.   Heart: regular Resp: normal work of breathing Abdomen: soft, nondistended, nontender to palpation. Dressing intact. Ileostomy productive. 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  SCDs in place   Lab Results:  Recent Labs    01/18/22 0449  WBC 6.9  HGB 9.2*  HCT 31.4*  PLT 308    BMET Recent Labs    01/18/22 0449  NA 139  K 3.7  CL 109  CO2 26  GLUCOSE 109*  BUN 14  CREATININE 0.37*  CALCIUM 10.0   PT/INR No results for input(s): LABPROT, INR in the last 72 hours. CMP     Component Value Date/Time   NA 139 01/18/2022 0449   K 3.7 01/18/2022 0449   CL 109 01/18/2022 0449   CO2 26 01/18/2022 0449   GLUCOSE 109 (H) 01/18/2022 0449   BUN 14 01/18/2022 0449   CREATININE 0.37 (L) 01/18/2022 0449   CALCIUM 10.0 01/18/2022 0449   CALCIUM 10.9 (H) 01/01/2022 0449   PROT 6.7 01/15/2022 0340   ALBUMIN <1.5 (L) 01/15/2022 0340   AST 17 01/15/2022 0340   ALT 34 01/15/2022 0340   ALKPHOS 82 01/15/2022 0340   BILITOT 0.6 01/15/2022 0340   GFRNONAA >60 01/18/2022 0449   Lipase     Component Value Date/Time   LIPASE 22 11/17/2021 0955       Studies/Results: No results found.  Anti-infectives: Anti-infectives (From admission, onward)    Start     Dose/Rate Route Frequency  Ordered Stop   01/01/22 2100  piperacillin-tazobactam (ZOSYN) IVPB 3.375 g  Status:  Discontinued        3.375 g 12.5 mL/hr over 240 Minutes Intravenous Every 8 hours 01/01/22 2042 01/10/22 1333   11/25/21 1000  piperacillin-tazobactam (ZOSYN) IVPB 3.375 g  Status:  Discontinued        3.375 g 12.5 mL/hr over 240 Minutes Intravenous Every 8 hours 11/25/21 0909 12/01/21 0822   11/17/21 1830  piperacillin-tazobactam (ZOSYN) IVPB 3.375 g        3.375 g 12.5 mL/hr over 240 Minutes Intravenous Every 8 hours 11/17/21 1415 11/22/21 2359   11/17/21 1230  piperacillin-tazobactam (ZOSYN) IVPB 3.375 g        3.375 g 100 mL/hr over 30 Minutes Intravenous  Once 11/17/21 1220 11/17/21 1304        Assessment/Plan 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 Harlow Asa 11/27 - path: necrotic colon at site of perforation, no malignancy or dysplasia noted  - Ileostomy output improved. Continue BID metamucil.  - WOC following for ileostomy, mucous fistula - Wound with blue-green, musty smelling drainage 1/11 - Dakin's TID x72 hrs. Wound probed and deeper tract opened after CT 1/11 with return of purulent drainage.   - upon DC of Dakin's would like TID  dry dressing to help absorb drainage  - Pathology benign. CT distal descending/proximal sigmoid bowel luminal collapse. - CT 1/11 - some extravasated oral contrast in the subcutaneous abdominal wall superior and lateral to the ostomy at this level. There is inflammatory tissue at the level of the ostomy extending superiorly abutting the inferior tip of the liver and fundus of the gallbladder similar to the prior examination. There is likely some early fluid collection development anterior to the right kidney image 7/31 measuring 1.9 by 1.4 by 3.7 cm. Previously described fluid collections near the ostomy are not as well defined on the current study. Ill-defined omental inflammation and  fluid in the lower abdomen and left upper quadrant appears unchanged and may be related to omental infarct. There is no free intraperitoneal air or ascites. Small air-fluid collection at the level of the umbilicus is again seen containing air. This collection measures 2.5 x 0.7 by 1.3 cm and has mildly increased in size. Air in the collection is new from prior. - no collection around the ileostomy that is drainable - the best way to try to resolve this is abx and improving nutrition -zosyn completed for last CT scan findings and UTI on 1/20 -Dr. Hassell Done stopped her TNA on 1/17 -at this time, the patient has no medical need for TNA as she has a functional GI tract.  Parenteral nutrition carries many long-term risks  and enteral nutrition is superior; therefore, oral intake or intake via other means such as g-tube, would be needed henceforth.    -palliative saw the patient and it sounds like she is good with a feeding tube and thinks that is what her sister would want  Temp curve improved.  Cultures staph epi 1/2 blood cultures.       FEN - Dysphagia 2 diet/but won't eat   VTE - SCDs, Lovenox ID - zosyn 11/27>12/2, 12/5>12/11, restarted 1/11>> 1/20 Foley - None currently    - encephalopathy/FTT -  On folic acid and thiamine supplementation. Appreciate TRH following as well.   - psych saw 12/27 and determined patient does not have capacity   - frequent urination - Urine Cx with enterococcus faecalis sensitive to ampicillin, Zosyn completed   Dispo: unclear at this time   LOS: 63 days      Stark Klein, [facility] 01/19/2022, 9:09 AM Please see Amion for pager number during day hours 7:00am-4:30pm

## 2022-01-20 LAB — CBC
HCT: 28.9 % — ABNORMAL LOW (ref 36.0–46.0)
Hemoglobin: 8.5 g/dL — ABNORMAL LOW (ref 12.0–15.0)
MCH: 25.9 pg — ABNORMAL LOW (ref 26.0–34.0)
MCHC: 29.4 g/dL — ABNORMAL LOW (ref 30.0–36.0)
MCV: 88.1 fL (ref 80.0–100.0)
Platelets: 260 10*3/uL (ref 150–400)
RBC: 3.28 MIL/uL — ABNORMAL LOW (ref 3.87–5.11)
RDW: 18.3 % — ABNORMAL HIGH (ref 11.5–15.5)
WBC: 6 10*3/uL (ref 4.0–10.5)
nRBC: 0 % (ref 0.0–0.2)

## 2022-01-20 LAB — CULTURE, BLOOD (ROUTINE X 2)

## 2022-01-20 LAB — COMPREHENSIVE METABOLIC PANEL
ALT: 16 U/L (ref 0–44)
AST: 17 U/L (ref 15–41)
Albumin: 1.7 g/dL — ABNORMAL LOW (ref 3.5–5.0)
Alkaline Phosphatase: 64 U/L (ref 38–126)
Anion gap: 6 (ref 5–15)
BUN: 8 mg/dL (ref 8–23)
CO2: 24 mmol/L (ref 22–32)
Calcium: 9.5 mg/dL (ref 8.9–10.3)
Chloride: 110 mmol/L (ref 98–111)
Creatinine, Ser: <0.3 mg/dL — ABNORMAL LOW (ref 0.44–1.00)
Glucose, Bld: 100 mg/dL — ABNORMAL HIGH (ref 70–99)
Potassium: 3.4 mmol/L — ABNORMAL LOW (ref 3.5–5.1)
Sodium: 140 mmol/L (ref 135–145)
Total Bilirubin: 0.5 mg/dL (ref 0.3–1.2)
Total Protein: 7.5 g/dL (ref 6.5–8.1)

## 2022-01-20 LAB — PROTIME-INR
INR: 1.5 — ABNORMAL HIGH (ref 0.8–1.2)
Prothrombin Time: 18.4 seconds — ABNORMAL HIGH (ref 11.4–15.2)

## 2022-01-20 LAB — MAGNESIUM: Magnesium: 1.6 mg/dL — ABNORMAL LOW (ref 1.7–2.4)

## 2022-01-20 MED ORDER — POTASSIUM CHLORIDE 20 MEQ PO PACK
40.0000 meq | PACK | Freq: Once | ORAL | Status: DC
  Filled 2022-01-20: qty 2

## 2022-01-20 MED ORDER — MAGNESIUM SULFATE 50 % IJ SOLN: 1.0000 g | Freq: Once | INTRAMUSCULAR | Status: DC

## 2022-01-20 MED ORDER — MAGNESIUM SULFATE IN D5W 1-5 GM/100ML-% IV SOLN
1.0000 g | Freq: Once | INTRAVENOUS | Status: AC
  Administered 2022-01-20: 1 g via INTRAVENOUS
  Filled 2022-01-20: qty 100

## 2022-01-20 MED ORDER — POTASSIUM CHLORIDE 20 MEQ PO PACK: 60.0000 meq | PACK | Freq: Once | ORAL | Status: DC

## 2022-01-20 NOTE — Plan of Care (Signed)
°  Problem: Health Behavior/Discharge Planning: Goal: Ability to manage health-related needs will improve Outcome: Not Progressing   Problem: Clinical Measurements: Goal: Ability to maintain clinical measurements within normal limits will improve Outcome: Not Progressing Goal: Will remain free from infection Outcome: Not Progressing Goal: Diagnostic test results will improve Outcome: Not Progressing Goal: Respiratory complications will improve Outcome: Not Progressing Goal: Cardiovascular complication will be avoided Outcome: Not Progressing   Problem: Activity: Goal: Risk for activity intolerance will decrease Outcome: Not Progressing   Problem: Nutrition: Goal: Adequate nutrition will be maintained Outcome: Not Progressing   Problem: Coping: Goal: Level of anxiety will decrease Outcome: Not Progressing   Problem: Elimination: Goal: Will not experience complications related to bowel motility Outcome: Not Progressing   Problem: Pain Managment: Goal: General experience of comfort will improve Outcome: Not Progressing   Problem: Safety: Goal: Ability to remain free from injury will improve Outcome: Not Progressing   Problem: Skin Integrity: Goal: Risk for impaired skin integrity will decrease Outcome: Not Progressing   Problem: Education: Goal: Required Educational Video(s) Outcome: Not Progressing   Problem: Clinical Measurements: Goal: Ability to maintain clinical measurements within normal limits will improve Outcome: Not Progressing Goal: Postoperative complications will be avoided or minimized Outcome: Not Progressing

## 2022-01-20 NOTE — Progress Notes (Signed)
Progress Note  64 Days Post-Op  Subjective: No significant changes. Afebrile last 24 hrs.   Objective: Vital signs in last 24 hours: Temp:  [98.8 F (37.1 C)-99.5 F (37.5 C)] 99 F (37.2 C) (01/30 0514) Pulse Rate:  [85-96] 85 (01/30 0514) Resp:  [18] 18 (01/30 0514) BP: (117-135)/(64-80) 117/73 (01/30 0514) SpO2:  [98 %-99 %] 99 % (01/30 0514) Last BM Date: 01/19/22  Intake/Output from previous day: 01/29 0701 - 01/30 0700 In: 933.8 [P.O.:210; I.V.:723.8] Out: 1175 [Urine:1050; Stool:125] Intake/Output this shift: No intake/output data recorded.  PE: General: resting, NAD, minimally interactive but does acknowledge my presence and nods to some of my questions  Heart: regular Resp: normal work of breathing Abdomen: soft, nondistended, nontender to palpation. Midline wound with healthy granulation tissue. Ileostomy productive. 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  SCDs in place   Lab Results:  Recent Labs    01/18/22 0449 01/20/22 0428  WBC 6.9 6.0  HGB 9.2* 8.5*  HCT 31.4* 28.9*  PLT 308 260   BMET Recent Labs    01/18/22 0449 01/20/22 0428  NA 139 140  K 3.7 3.4*  CL 109 110  CO2 26 24  GLUCOSE 109* 100*  BUN 14 8  CREATININE 0.37* <0.30*  CALCIUM 10.0 9.5   PT/INR No results for input(s): LABPROT, INR in the last 72 hours. CMP     Component Value Date/Time   NA 140 01/20/2022 0428   K 3.4 (L) 01/20/2022 0428   CL 110 01/20/2022 0428   CO2 24 01/20/2022 0428   GLUCOSE 100 (H) 01/20/2022 0428   BUN 8 01/20/2022 0428   CREATININE <0.30 (L) 01/20/2022 0428   CALCIUM 9.5 01/20/2022 0428   CALCIUM 10.9 (H) 01/01/2022 0449   PROT 7.5 01/20/2022 0428   ALBUMIN 1.7 (L) 01/20/2022 0428   AST 17 01/20/2022 0428   ALT 16 01/20/2022 0428   ALKPHOS 64 01/20/2022 0428   BILITOT 0.5 01/20/2022 0428   GFRNONAA NOT CALCULATED 01/20/2022 0428   Lipase     Component Value Date/Time    LIPASE 22 11/17/2021 0955       Studies/Results: No results found.  Anti-infectives: Anti-infectives (From admission, onward)    Start     Dose/Rate Route Frequency Ordered Stop   01/01/22 2100  piperacillin-tazobactam (ZOSYN) IVPB 3.375 g  Status:  Discontinued        3.375 g 12.5 mL/hr over 240 Minutes Intravenous Every 8 hours 01/01/22 2042 01/10/22 1333   11/25/21 1000  piperacillin-tazobactam (ZOSYN) IVPB 3.375 g  Status:  Discontinued        3.375 g 12.5 mL/hr over 240 Minutes Intravenous Every 8 hours 11/25/21 0909 12/01/21 0822   11/17/21 1830  piperacillin-tazobactam (ZOSYN) IVPB 3.375 g        3.375 g 12.5 mL/hr over 240 Minutes Intravenous Every 8 hours 11/17/21 1415 11/22/21 2359   11/17/21 1230  piperacillin-tazobactam (ZOSYN) IVPB 3.375 g        3.375 g 100 mL/hr over 30 Minutes Intravenous  Once 11/17/21 1220 11/17/21 1304        Assessment/Plan 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 Harlow Asa 11/27 - path: necrotic colon at site of perforation, no malignancy or dysplasia noted  - Ileostomy output improved. Continue BID metamucil.  - WOC following for ileostomy, mucous fistula - Wound improved with healthy granulation  tissue present, no tracts or purulent drainage present on dressing change this AM. Daily WTD dressing  - Pathology benign. CT distal descending/proximal sigmoid bowel luminal collapse. - CT 1/11 - some extravasated oral contrast in the subcutaneous abdominal wall superior and lateral to the ostomy at this level. There is inflammatory tissue at the level of the ostomy extending superiorly abutting the inferior tip of the liver and fundus of the gallbladder similar to the prior examination. There is likely some early fluid collection development anterior to the right kidney image 7/31 measuring 1.9 by 1.4 by 3.7 cm. Previously described fluid collections near the ostomy  are not as well defined on the current study. 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 There is no free intraperitoneal air or ascites. Small air-fluid collection at the level of the umbilicus is again seen containing air. This collection measures 2.5 x 0.7 by 1.3 cm and has mildly increased in size. Air in the collection is new from prior. - no collection around the ileostomy that is drainable - the best way to try to resolve this is abx and improving nutrition - zosyn completed for last CT scan findings and UTI on 1/20 - Dr. Hassell Done stopped her TNA on 1/17 - at this time, the patient has no medical need for TNA as she has a functional GI tract.  Parenteral nutrition carries many long-term risks  and enteral nutrition is superior; therefore, oral intake or intake via other means such as g-tube, would be needed henceforth.    -palliative saw the patient and it sounds like she is good with a feeding tube and thinks that is what her sister would want  - IR consult placed for percutaneous gastrostomy placement  - fever curve improved and afebrile for last 24 hrs, urine cx with multiple species and will resend today, blood cx with staph 1/2 thought to be contaminate and repeat blood cxs sent 1/29  FEN - Dysphagia 2 diet/but won't eat; replace magnesium IV and potassium in IVF and PO   VTE - SCDs, Lovenox ID - zosyn 11/27>12/2, 12/5>12/11, restarted 1/11> 1/20 Foley - None currently    - encephalopathy/FTT -  On folic acid and thiamine supplementation. Appreciate TRH following as well.   - psych saw 12/27 and determined patient does not have capacity   - frequent urination - Urine Cx 1/10 with enterococcus faecalis sensitive to ampicillin, Zosyn completed. Urine Cx ordered 1/26 with multiple species, reordered 1/30.   Dispo: unclear at this time  LOS: 64 days   I reviewed nursing notes,  Consultant Palliative care notes, hospitalist notes, last 24 h vitals and pain scores, last 48 h intake and output, and last 24 h labs and trends.  This care required moderate level of medical decision making.    Norm Parcel, [facility] 01/20/2022, 8:51 AM Please see Amion for pager number during day hours 7:00am-4:30pm

## 2022-01-20 NOTE — Progress Notes (Signed)
Attempted to call patient's sister, [name], for clinical update this AM. No answer and mailbox was full. IR tried to reach her earlier this AM as well and was unable to reach her.   Norm Parcel, [facility] 01/20/2022, 10:45 AM Please see Amion for pager number during day hours 7:00am-4:30pm

## 2022-01-20 NOTE — Progress Notes (Signed)
PROGRESS NOTE  Sherry Hampton JJK:[id number] DOB: 1956/02/03 DOA: 11/17/2021 PCP: Pcp, No   LOS: 84 days   Brief narrative:  66 years old female with history of hyperlipidemia and mild memory loss presented to hospital with acute abnormal pain.  She was noted to have perforated viscus of the ascending colon with fecal peritonitis and required right colectomy and ileostomy.  Hospital course was complicated by fluid collection in the abdomen malnutrition progressive decline and encephalopathy.  TRH was consulted for assistance with general medical management.  Assessment/Plan:  Principal Problem:   Perforated abdominal viscus Active Problems:   GERD (gastroesophageal reflux disease)   Hyperlipidemia   Hyponatremia   Acute blood loss anemia   Acute metabolic encephalopathy   Protein-calorie malnutrition, severe   Delirium   Necrosis of colon with perforation    Ileostomy in place [facility])   Abnormal neurological exam   Memory loss   Hypercalcemia   Folate deficiency   Common bile duct dilatation   Enterococcus UTI   Goals of care, counseling/discussion   Vitamin B6 deficiency   Perforated abdominal viscus  Present on admission.  Status post exploratory laparotomy with left colectomy and end ileostomy.  Primary care team on board.  Wound care on board.   Enterococcus UTI - resolved . Completed course of antibiotic recently.  Episode of fever. Temperature max of 99.5 F in the last 24 hours.  Urinalysis less than 5 white cells and urine culture with multiple species.  Blood culture showing MRSA positive staph epidermidis in 1 bottle.  Could be contaminant.  Chest x-ray done on 01/16/2022 showed no evidence of infiltrate.  We will need to continue to monitor.  Status post PICC line removal.  Patient did have recent antibiotics last dose on 01/10/2022.  I spoke with Dr. Candiss Norse infectious disease on 01/19/2022 who recommended repeating blood culture which is pending at this time.  Mild  transaminitis Has improved   Acute metabolic encephalopathy  MRI brain 12/11/21 without acute findings and repeat 01/05/22 again without acute findings.  EEG only noted slowing consistent with encephalopathy.  Neurology was consulted during hospitalization.  Continue supportive care.  Appears to be slightly more somnolent today.  Continue vitamin B6 and folic acid.   Vitamin B6 deficiency.  Continue supplementation.   Folic acid deficiency Continue supplementation    Hypercalcemia Improved.  Latest calcium of 9.5..  PTH related peptide was less than 2.   Hypokalemia Will replace for potassium of 3.4.  Patient will receive oral potassium today.   Hypomagnesemia Will replace for magnesium of 1.6 today.  Patient has been prescribed IV magnesium sulfate today.   Severe protein calorie malnutrition, failure to thrive, poor oral intake Primary team has recommended PEG to family, palliative care has discussed with the family.  IR has been consulted for PEG tube placement.  Patient has very poor oral intake.  Goals of care.  Palliative care  on board.  DVT prophylaxis: enoxaparin (LOVENOX) injection 40 mg Start: 11/18/21 0800 SCDs Start: 11/17/21 1336   Code Status: Full code  Status is: Inpatient  Procedures: Status post exploratory laparotomy with right colectomy and ileostomy on 11/17/2021  Anti-infectives:  None  Anti-infectives (From admission, onward)    Start     Dose/Rate Route Frequency Ordered Stop   01/01/22 2100  piperacillin-tazobactam (ZOSYN) IVPB 3.375 g  Status:  Discontinued        3.375 g 12.5 mL/hr over 240 Minutes Intravenous Every 8 hours 01/01/22 2042 01/10/22 1333  11/25/21 1000  piperacillin-tazobactam (ZOSYN) IVPB 3.375 g  Status:  Discontinued        3.375 g 12.5 mL/hr over 240 Minutes Intravenous Every 8 hours 11/25/21 0909 12/01/21 0822   11/17/21 1830  piperacillin-tazobactam (ZOSYN) IVPB 3.375 g        3.375 g 12.5 mL/hr over 240 Minutes  Intravenous Every 8 hours 11/17/21 1415 11/22/21 2359   11/17/21 1230  piperacillin-tazobactam (ZOSYN) IVPB 3.375 g        3.375 g 100 mL/hr over 30 Minutes Intravenous  Once 11/17/21 1220 11/17/21 1304       Subjective: Today, patient was seen and examined at bedside.  Appears to be little more sleepy today.  Denies overt pain.  Has not been eating much  Objective: Vitals:   01/19/22 2029 01/20/22 0514  BP: 135/80 117/73  Pulse: 94 85  Resp: 18 18  Temp: 99.5 F (37.5 C) 99 F (37.2 C)  SpO2: 98% 99%    Intake/Output Summary (Last 24 hours) at 01/20/2022 1052 Last data filed at 01/20/2022 1000 Gross per 24 hour  Intake 513.01 ml  Output 975 ml  Net -461.99 ml    Filed Weights   12/17/21 0500 12/24/21 0500 12/30/21 0635  Weight: 76.5 kg 77.9 kg 73.3 kg   Body mass index is 23.19 kg/m.   Physical Exam: General:  Average built, not in obvious distress appears frail deconditioned, mildly verbal, HENT:   No scleral pallor or icterus noted. Oral mucosa is moist.  Chest:   Diminished breath sounds bilaterally. No crackles or wheezes.  CVS: S1 &S2 heard. No murmur.  Regular rate and rhythm. Abdomen: Soft, nontender, nondistended.  Bowel sounds are heard.  Ileostomy in place.  Midline incision.  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 Extremities: No cyanosis, clubbing or edema.  Peripheral pulses are palpable. Psych: Alert, awake and slow to respond, mildly verbal, CNS: Moves extremities.  Generalized weakness noted. Skin: Warm and dry.  Data Review: I have personally reviewed the following laboratory data and studies,  CBC: Recent Labs  Lab 01/15/22 0340 01/16/22 0341 01/18/22 0449 01/20/22 0428  WBC 6.1 5.9 6.9 6.0  HGB 8.0* 8.2* 9.2* 8.5*  HCT 26.6* 27.2* 31.4* 28.9*  MCV 87.5 87.7 89.5 88.1  PLT 281 306 308 440    Basic Metabolic Panel: Recent Labs  Lab 01/14/22 0423 01/15/22 0340 01/18/22 0449 01/20/22 0428  NA 135 135 139 140  K 3.5 3.6 3.7 3.4*  CL 107 107 109  110  CO2 24 25 26 24   GLUCOSE 119* 107* 109* 100*  BUN 8 8 14 8   CREATININE 0.32* <0.30* 0.37* <0.30*  CALCIUM 9.3 9.4 10.0 9.5  MG  --  1.7  --  1.6*    Liver Function Tests: Recent Labs  Lab 01/15/22 0340 01/20/22 0428  AST 17 17  ALT 34 16  ALKPHOS 82 64  BILITOT 0.6 0.5  PROT 6.7 7.5  ALBUMIN <1.5* 1.7*    No results for input(s): LIPASE, AMYLASE in the last 168 hours. No results for input(s): AMMONIA in the last 168 hours. Cardiac Enzymes: No results for input(s): CKTOTAL, CKMB, CKMBINDEX, TROPONINI in the last 168 hours. BNP (last 3 results) No results for input(s): BNP in the last 8760 hours.  ProBNP (last 3 results) No results for input(s): PROBNP in the last 8760 hours.  CBG: No results for input(s): GLUCAP in the last 168 hours.  Recent Results (from the past 240 hour(s))  Urine Culture  Status: Abnormal   Collection Time: 01/16/22  5:44 PM   Specimen: Urine, Clean Catch  Result Value Ref Range Status   Specimen Description   Final    URINE, CLEAN CATCH Performed at [facility], [place] [address], [place], [place] [address]    Special Requests   Final    NONE Performed at [facility], [place] [address] [place], [place] [address]    Culture MULTIPLE SPECIES PRESENT, SUGGEST RECOLLECTION (A)  Final   Report Status 01/18/2022 FINAL  Final  Culture, blood (Routine X 2) w Reflex to ID Panel     Status: None (Preliminary result)   Collection Time: 01/17/22  6:40 AM   Specimen: BLOOD LEFT HAND  Result Value Ref Range Status   Specimen Description   Final    BLOOD LEFT HAND Performed at [facility] [address] [place], [place] [address]    Special Requests   Final    BOTTLES DRAWN AEROBIC ONLY Blood Culture results may not be optimal due to an inadequate volume of blood received in culture bottles Performed at [facility] [address] [place], [place] [address]     Culture   Final    NO GROWTH 2 DAYS Performed at [facility] [address], [place], [place] [address]    Report Status PENDING  Incomplete  Culture, blood (Routine X 2) w Reflex to ID Panel     Status: Abnormal   Collection Time: 01/17/22  7:06 AM   Specimen: BLOOD  Result Value Ref Range Status   Specimen Description   Final    BLOOD RIGHT WRIST Performed at [facility] [address], [place], [place] [address]    Special Requests   Final    BOTTLES DRAWN AEROBIC ONLY Blood Culture results may not be optimal due to an inadequate volume of blood received in culture bottles Performed at [facility] [address] [place], [place] [address]    Culture  Setup Time   Final    GRAM POSITIVE COCCI IN CLUSTERS AEROBIC BOTTLE ONLY CRITICAL RESULT CALLED TO, READ BACK BY AND VERIFIED WITH: J,GADHIA PHARMD @1050  01/18/22 EB    Culture (A)  Final    STAPHYLOCOCCUS EPIDERMIDIS THE SIGNIFICANCE OF ISOLATING THIS ORGANISM FROM A SINGLE SET OF BLOOD CULTURES WHEN MULTIPLE SETS ARE DRAWN IS UNCERTAIN. PLEASE NOTIFY THE MICROBIOLOGY DEPARTMENT WITHIN ONE WEEK IF SPECIATION AND SENSITIVITIES ARE REQUIRED. Performed at [facility], [place] [address], [place], [place] [address]    Report Status 01/20/2022 FINAL  Final  Blood Culture ID Panel (Reflexed)     Status: Abnormal   Collection Time: 01/17/22  7:06 AM  Result Value Ref Range Status   Enterococcus faecalis NOT DETECTED NOT DETECTED Final   Enterococcus Faecium NOT DETECTED NOT DETECTED Final   Listeria monocytogenes NOT DETECTED NOT DETECTED Final   Staphylococcus species DETECTED (A) NOT DETECTED Final    Comment: CRITICAL RESULT CALLED TO, READ BACK BY AND VERIFIED WITH: J,GADHIA PHARMD @1050  01/18/22 EB    Staphylococcus aureus (BCID) NOT DETECTED NOT DETECTED Final   Staphylococcus epidermidis DETECTED (A) NOT DETECTED Final    Comment: Methicillin (oxacillin) resistant coagulase  negative staphylococcus. Possible blood culture contaminant (unless isolated from more than one blood culture draw or clinical case suggests pathogenicity). No antibiotic treatment is indicated for blood  culture contaminants. CRITICAL RESULT CALLED TO, READ BACK BY AND VERIFIED  WITH: J,GADHIA PHARMD @1050  01/18/22 EB    Staphylococcus lugdunensis NOT DETECTED NOT DETECTED Final   Streptococcus species NOT DETECTED NOT DETECTED Final   Streptococcus agalactiae NOT DETECTED NOT DETECTED Final   Streptococcus pneumoniae NOT DETECTED NOT DETECTED Final   Streptococcus pyogenes NOT DETECTED NOT DETECTED Final   A.calcoaceticus-baumannii NOT DETECTED NOT DETECTED Final   Bacteroides fragilis NOT DETECTED NOT DETECTED Final   Enterobacterales NOT DETECTED NOT DETECTED Final   Enterobacter cloacae complex NOT DETECTED NOT DETECTED Final   Escherichia coli NOT DETECTED NOT DETECTED Final   Klebsiella aerogenes NOT DETECTED NOT DETECTED Final   Klebsiella oxytoca NOT DETECTED NOT DETECTED Final   Klebsiella pneumoniae NOT DETECTED NOT DETECTED Final   Proteus species NOT DETECTED NOT DETECTED Final   Salmonella species NOT DETECTED NOT DETECTED Final   Serratia marcescens NOT DETECTED NOT DETECTED Final   Haemophilus influenzae NOT DETECTED NOT DETECTED Final   Neisseria meningitidis NOT DETECTED NOT DETECTED Final   Pseudomonas aeruginosa NOT DETECTED NOT DETECTED Final   Stenotrophomonas maltophilia NOT DETECTED NOT DETECTED Final   Candida albicans NOT DETECTED NOT DETECTED Final   Candida auris NOT DETECTED NOT DETECTED Final   Candida glabrata NOT DETECTED NOT DETECTED Final   Candida krusei NOT DETECTED NOT DETECTED Final   Candida parapsilosis NOT DETECTED NOT DETECTED Final   Candida tropicalis NOT DETECTED NOT DETECTED Final   Cryptococcus neoformans/gattii NOT DETECTED NOT DETECTED Final   Methicillin resistance mecA/C DETECTED (A) NOT DETECTED Final    Comment: CRITICAL RESULT  CALLED TO, READ BACK BY AND VERIFIED WITH: J,GADHIA PHARMD @1050  01/18/22 EB Performed at [facility], [address] [place], [place] [address]      Studies: No results found.    Flora Lipps, MD  [facility] 01/20/2022  If 7PM-7AM, please contact night-coverage

## 2022-01-20 NOTE — Progress Notes (Signed)
Patient was able to take medications this morning. Patient became angry and yelled when I tried to reposition her. IR came up to place feeding tube but was unable to reach the sister for consent.

## 2022-01-20 NOTE — Consult Note (Addendum)
Ethics committee consultation note.  I have reviewed patient's electronic medical record and I have talked to Bill Salinas RN. I have called Mrs. Ellyn Hack but unfortunately not able to reach hear, and there is no option to leave a message on her phone. Apparently she has returned to [place].   I will call her again tomorrow, if no answer I will call Mr. [name].

## 2022-01-20 NOTE — Progress Notes (Signed)
Palliative Care Brief Note  I checked in briefly on Ms. Sherry Hampton today.  No significant change.  She remains unable to participate in conversation regarding goals of care.  Attempted to call her sister, Sherry Hampton, but did not reach her today.  Also attempted to call brother, Sherry Hampton, without success.  - I still recommend pursuing PEG tube placement.  Micheline Rough, MD  Team [phone]  No charge note

## 2022-01-20 NOTE — Consult Note (Signed)
[facility] Nurse ostomy follow up Stoma type/location: RLQ ileostomy Stomal assessment/size: 1 and 1/8 inch round, red, dry, lumen in center, slightly elevated Peristomal assessment: intact Treatment options for stomal/peristomal skin: skin barrier ring Output: 163mls brown liquid effluent in pouch Ostomy pouching: 2pc. 2 and 1/4 inch pouching system with skin barrier ring Education provided: None Enrolled patient in [employer] Discharge program: Yes, previously  [place] Nurse ostomy follow up Stoma type/location: LUQ mucus fistula Stomal assessment/size: 1 and 1/8 inch oval, flush with skin, lumen in center, dry Peristomal assessment: intact Treatment options for stomal/peristomal skin: none Output: None Ostomy pouching: 2pc. 2 and 1/4 inch pouching system Education provided: None Enrolled patient in [employer] Discharge program: Yes, previously  Note: Supplies necessary for the care of this patient (pouches, skin barriers and skin barrier rings) are floor stock and are located in the clear supply room.  [place] nursing team will see weekly, and will remain available to this patient, the nursing and medical teams.    Thanks, Maudie Flakes, MSN, RN, [place], Arther Abbott  Pager# [phone]

## 2022-01-21 LAB — URINE CULTURE

## 2022-01-21 LAB — BASIC METABOLIC PANEL
Anion gap: 3 — ABNORMAL LOW (ref 5–15)
BUN: 5 mg/dL — ABNORMAL LOW (ref 8–23)
CO2: 26 mmol/L (ref 22–32)
Calcium: 9.7 mg/dL (ref 8.9–10.3)
Chloride: 111 mmol/L (ref 98–111)
Creatinine, Ser: 0.31 mg/dL — ABNORMAL LOW (ref 0.44–1.00)
GFR, Estimated: >60 mL/min (ref >60)
Glucose, Bld: 105 mg/dL — ABNORMAL HIGH (ref 70–99)
Potassium: 3.4 mmol/L — ABNORMAL LOW (ref 3.5–5.1)
Sodium: 140 mmol/L (ref 135–145)

## 2022-01-21 LAB — MAGNESIUM: Magnesium: 1.9 mg/dL (ref 1.7–2.4)

## 2022-01-21 MED ORDER — POTASSIUM CHLORIDE 20 MEQ PO PACK
20.0000 meq | PACK | Freq: Two times a day (BID) | ORAL | Status: DC
  Filled 2022-01-21 (×7): qty 1

## 2022-01-21 NOTE — Progress Notes (Signed)
PROGRESS NOTE  Sherry Hampton ZJI:[id number] DOB: 1956-12-04 DOA: 11/17/2021 PCP: Pcp, No   LOS: 53 days   Brief narrative:  66 years old female with history of hyperlipidemia and mild memory loss presented to hospital with acute abnormal pain.  She was noted to have perforated viscus of the ascending colon with fecal peritonitis and required right colectomy and ileostomy.  Hospital course was complicated by fluid collection in the abdomen malnutrition progressive decline and encephalopathy.  TRH was consulted for assistance with general medical management.  Assessment/Plan:  Principal Problem:   Perforated abdominal viscus Active Problems:   GERD (gastroesophageal reflux disease)   Hyperlipidemia   Hyponatremia   Acute blood loss anemia   Acute metabolic encephalopathy   Protein-calorie malnutrition, severe   Delirium   Necrosis of colon with perforation    Ileostomy in place [facility])   Abnormal neurological exam   Memory loss   Hypercalcemia   Folate deficiency   Common bile duct dilatation   Enterococcus UTI   Goals of care, counseling/discussion   Vitamin B6 deficiency   Perforated abdominal viscus  Present on admission.  Status post exploratory laparotomy with left colectomy and end ileostomy.  Primary care team on board.    Enterococcus UTI - resolved . Completed course of antibiotic recently.  Episode of fever. Temperature max of 98.9 F in the last 24 hours.  Urinalysis less than 5 white cells and urine culture with multiple species.  Blood culture showing MRSA positive staph epidermidis in 1 bottle.  Likely contaminant.  Chest x-ray done on 01/16/2022 showed no evidence of infiltrate.   Status post PICC line removal.  Patient did have recent antibiotics last dose on 01/10/2022.  I spoke with Dr. Candiss Norse infectious disease on 01/19/2022 who recommended repeating blood culture which is negative in less than 24 hours.  Continue to monitor.  Mild transaminitis Has improved    Acute metabolic encephalopathy  MRI brain 12/11/21 without acute findings and repeat 01/05/22 again without acute findings.  EEG only noted slowing consistent with encephalopathy.  Neurology was consulted during hospitalization.  Continue supportive care.  Appears to be slightly more somnolent today.  Continue vitamin B6 and folic acid.   Vitamin B6 deficiency.  Continue supplementation.   Folic acid deficiency Continue supplementation    Hypercalcemia Improved.  Latest calcium of 9.7..  PTH related peptide was less than 2.   Hypokalemia On IV KCl through fluids..   Hypomagnesemia Improved after IV magnesium sulfate replacement.    Severe protein calorie malnutrition, failure to thrive, poor oral intake Primary team has recommended PEG to family, palliative care has discussed with the family.  IR has been consulted for PEG tube placement.  Patient has very poor oral intake.  Goals of care.  Palliative care  on board.  DVT prophylaxis: enoxaparin (LOVENOX) injection 40 mg Start: 11/18/21 0800 SCDs Start: 11/17/21 1336   Code Status: Full code  Status is: Inpatient  Procedures: Status post exploratory laparotomy with right colectomy and ileostomy on 11/17/2021  Anti-infectives:  None  Anti-infectives (From admission, onward)    Start     Dose/Rate Route Frequency Ordered Stop   01/01/22 2100  piperacillin-tazobactam (ZOSYN) IVPB 3.375 g  Status:  Discontinued        3.375 g 12.5 mL/hr over 240 Minutes Intravenous Every 8 hours 01/01/22 2042 01/10/22 1333   11/25/21 1000  piperacillin-tazobactam (ZOSYN) IVPB 3.375 g  Status:  Discontinued        3.375 g 12.5 mL/hr  over 240 Minutes Intravenous Every 8 hours 11/25/21 0909 12/01/21 0822   11/17/21 1830  piperacillin-tazobactam (ZOSYN) IVPB 3.375 g        3.375 g 12.5 mL/hr over 240 Minutes Intravenous Every 8 hours 11/17/21 1415 11/22/21 2359   11/17/21 1230  piperacillin-tazobactam (ZOSYN) IVPB 3.375 g        3.375  g 100 mL/hr over 30 Minutes Intravenous  Once 11/17/21 1220 11/17/21 1304       Subjective: Today, patient was seen and examined at bedside denies overt pain nausea vomiting fever or chills.  Denies shortness of breath, cough.  Slow to respond.  Objective: Vitals:   01/20/22 2125 01/21/22 0533  BP: 122/66 129/83  Pulse: 79 81  Resp: 15 15  Temp: 98.9 F (37.2 C) 98.7 F (37.1 C)  SpO2: 100% 100%    Intake/Output Summary (Last 24 hours) at 01/21/2022 0911 Last data filed at 01/21/2022 0520 Gross per 24 hour  Intake 1851.86 ml  Output 950 ml  Net 901.86 ml    Filed Weights   12/24/21 0500 12/30/21 0635 01/20/22 0918  Weight: 77.9 kg 73.3 kg 79.7 kg   Body mass index is 25.21 kg/m.   Physical Exam: General: Frail-appearing, deconditioned, verbal  HENT:   No scleral pallor or icterus noted. Oral mucosa is moist.  Chest:    Diminished breath sounds bilaterally. No crackles or wheezes.  CVS: S1 &S2 heard. No murmur.  Regular rate and rhythm. 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 Extremities: No cyanosis, clubbing or edema.  Peripheral pulses are palpable. Psych: Alert, awake and oriented to place and person., CNS:  No cranial nerve deficits.  Moves all extremities.  Generalized weakness noted. Skin: Warm and dry.  No rashes noted.   Data Review: I have personally reviewed the following laboratory data and studies,  CBC: Recent Labs  Lab 01/15/22 0340 01/16/22 0341 01/18/22 0449 01/20/22 0428  WBC 6.1 5.9 6.9 6.0  HGB 8.0* 8.2* 9.2* 8.5*  HCT 26.6* 27.2* 31.4* 28.9*  MCV 87.5 87.7 89.5 88.1  PLT 281 306 308 440    Basic Metabolic Panel: Recent Labs  Lab 01/15/22 0340 01/18/22 0449 01/20/22 0428 01/21/22 0357  NA 135 139 140 140  K 3.6 3.7 3.4* 3.4*  CL 107 109 110 111  CO2 25 26 24 26   GLUCOSE 107* 109* 100* 105*  BUN 8 14 8  5*  CREATININE <0.30* 0.37* <0.30* 0.31*  CALCIUM 9.4 10.0 9.5 9.7  MG  1.7  --  1.6* 1.9    Liver Function Tests: Recent Labs  Lab 01/15/22 0340 01/20/22 0428  AST 17 17  ALT 34 16  ALKPHOS 82 64  BILITOT 0.6 0.5  PROT 6.7 7.5  ALBUMIN <1.5* 1.7*    No results for input(s): LIPASE, AMYLASE in the last 168 hours. No results for input(s): AMMONIA in the last 168 hours. Cardiac Enzymes: No results for input(s): CKTOTAL, CKMB, CKMBINDEX, TROPONINI in the last 168 hours. BNP (last 3 results) No results for input(s): BNP in the last 8760 hours.  ProBNP (last 3 results) No results for input(s): PROBNP in the last 8760 hours.  CBG: No results for input(s): GLUCAP in the last 168 hours.  Recent Results (from the past 240 hour(s))  Urine Culture     Status: Abnormal   Collection Time: 01/16/22  5:44 PM   Specimen: Urine, Clean Catch  Result Value Ref Range Status   Specimen Description  Final    URINE, CLEAN CATCH Performed at [facility], [place] [address] [place], [place] [address]    Special Requests   Final    NONE Performed at [facility], [place] [address], [place], [place] [address]    Culture MULTIPLE SPECIES PRESENT, SUGGEST RECOLLECTION (A)  Final   Report Status 01/18/2022 FINAL  Final  Culture, blood (Routine X 2) w Reflex to ID Panel     Status: None (Preliminary result)   Collection Time: 01/17/22  6:40 AM   Specimen: BLOOD LEFT HAND  Result Value Ref Range Status   Specimen Description   Final    BLOOD LEFT HAND Performed at [place] [address] [place], [place] [address]    Special Requests   Final    BOTTLES DRAWN AEROBIC ONLY Blood Culture results may not be optimal due to an inadequate volume of blood received in culture bottles Performed at [place] [address], [place], [place] [address]    Culture   Final    NO GROWTH 3 DAYS Performed at [facility], [place] [address] [place], [place] [address]    Report Status PENDING   Incomplete  Culture, blood (Routine X 2) w Reflex to ID Panel     Status: Abnormal   Collection Time: 01/17/22  7:06 AM   Specimen: BLOOD  Result Value Ref Range Status   Specimen Description   Final    BLOOD RIGHT WRIST Performed at [place] [address] [place], [place] [address]    Special Requests   Final    BOTTLES DRAWN AEROBIC ONLY Blood Culture results may not be optimal due to an inadequate volume of blood received in culture bottles Performed at [place] [address] [place], [place] [address]    Culture  Setup Time   Final    GRAM POSITIVE COCCI IN CLUSTERS AEROBIC BOTTLE ONLY CRITICAL RESULT CALLED TO, READ BACK BY AND VERIFIED WITH: J,GADHIA PHARMD @1050  01/18/22 EB    Culture (A)  Final    STAPHYLOCOCCUS EPIDERMIDIS THE SIGNIFICANCE OF ISOLATING THIS ORGANISM FROM A SINGLE SET OF BLOOD CULTURES WHEN MULTIPLE SETS ARE DRAWN IS UNCERTAIN. PLEASE NOTIFY THE MICROBIOLOGY DEPARTMENT WITHIN ONE WEEK IF SPECIATION AND SENSITIVITIES ARE REQUIRED. Performed at [facility], [place] [address], [place], [place] [address]    Report Status 01/20/2022 FINAL  Final  Blood Culture ID Panel (Reflexed)     Status: Abnormal   Collection Time: 01/17/22  7:06 AM  Result Value Ref Range Status   Enterococcus faecalis NOT DETECTED NOT DETECTED Final   Enterococcus Faecium NOT DETECTED NOT DETECTED Final   Listeria monocytogenes NOT DETECTED NOT DETECTED Final   Staphylococcus species DETECTED (A) NOT DETECTED Final    Comment: CRITICAL RESULT CALLED TO, READ BACK BY AND VERIFIED WITH: J,GADHIA PHARMD @1050  01/18/22 EB    Staphylococcus aureus (BCID) NOT DETECTED NOT DETECTED Final   Staphylococcus epidermidis DETECTED (A) NOT DETECTED Final    Comment: Methicillin (oxacillin) resistant coagulase negative staphylococcus. Possible blood culture contaminant (unless isolated from more than one blood culture draw or clinical case suggests  pathogenicity). No antibiotic treatment is indicated for blood  culture contaminants. CRITICAL RESULT CALLED TO, READ BACK BY AND VERIFIED WITH: J,GADHIA PHARMD @1050  01/18/22 EB    Staphylococcus lugdunensis NOT DETECTED NOT DETECTED Final   Streptococcus species NOT DETECTED NOT DETECTED Final   Streptococcus  agalactiae NOT DETECTED NOT DETECTED Final   Streptococcus pneumoniae NOT DETECTED NOT DETECTED Final   Streptococcus pyogenes NOT DETECTED NOT DETECTED Final   A.calcoaceticus-baumannii NOT DETECTED NOT DETECTED Final   Bacteroides fragilis NOT DETECTED NOT DETECTED Final   Enterobacterales NOT DETECTED NOT DETECTED Final   Enterobacter cloacae complex NOT DETECTED NOT DETECTED Final   Escherichia coli NOT DETECTED NOT DETECTED Final   Klebsiella aerogenes NOT DETECTED NOT DETECTED Final   Klebsiella oxytoca NOT DETECTED NOT DETECTED Final   Klebsiella pneumoniae NOT DETECTED NOT DETECTED Final   Proteus species NOT DETECTED NOT DETECTED Final   Salmonella species NOT DETECTED NOT DETECTED Final   Serratia marcescens NOT DETECTED NOT DETECTED Final   Haemophilus influenzae NOT DETECTED NOT DETECTED Final   Neisseria meningitidis NOT DETECTED NOT DETECTED Final   Pseudomonas aeruginosa NOT DETECTED NOT DETECTED Final   Stenotrophomonas maltophilia NOT DETECTED NOT DETECTED Final   Candida albicans NOT DETECTED NOT DETECTED Final   Candida auris NOT DETECTED NOT DETECTED Final   Candida glabrata NOT DETECTED NOT DETECTED Final   Candida krusei NOT DETECTED NOT DETECTED Final   Candida parapsilosis NOT DETECTED NOT DETECTED Final   Candida tropicalis NOT DETECTED NOT DETECTED Final   Cryptococcus neoformans/gattii NOT DETECTED NOT DETECTED Final   Methicillin resistance mecA/C DETECTED (A) NOT DETECTED Final    Comment: CRITICAL RESULT CALLED TO, READ BACK BY AND VERIFIED WITH: J,GADHIA PHARMD @1050  01/18/22 EB Performed at [facility], [address] [place],  [place] [address]   Culture, blood (routine x 2)     Status: None (Preliminary result)   Collection Time: 01/19/22 11:27 AM   Specimen: BLOOD  Result Value Ref Range Status   Specimen Description   Final    BLOOD BLOOD RIGHT HAND Performed at [place] [address] [place], [place] [address]    Special Requests   Final    BOTTLES DRAWN AEROBIC ONLY Blood Culture adequate volume Performed at [place] [address], [place], [place] [address]    Culture  Setup Time   Final    GRAM POSITIVE COCCI IN CLUSTERS AEROBIC BOTTLE ONLY CRITICAL VALUE NOTED.  VALUE IS CONSISTENT WITH PREVIOUSLY REPORTED AND CALLED VALUE. Performed at [facility], [place] [address] [place], [place] [address]    Culture GRAM POSITIVE COCCI  Final   Report Status PENDING  Incomplete  Culture, blood (routine x 2)     Status: None (Preliminary result)   Collection Time: 01/19/22 11:33 AM   Specimen: BLOOD  Result Value Ref Range Status   Specimen Description   Final    BLOOD BLOOD LEFT HAND Performed at [place] [address] [place], [place] [address]    Special Requests   Final    BOTTLES DRAWN AEROBIC ONLY Blood Culture results may not be optimal due to an inadequate volume of blood received in culture bottles Performed at [place] [address], [place],  [place] [address]    Culture   Final    NO GROWTH < 24 HOURS Performed at [place] [address] [place],  [address]    Report Status PENDING  Incomplete     Studies: No results found.    Flora Lipps, MD  [facility] 01/21/2022  If 7PM-7AM, please contact night-coverage

## 2022-01-21 NOTE — Consult Note (Addendum)
Ethics consult.   I have talked to Sherry Hampton, she is calm and not in distress.  She has confusion. She is not able to tell me her diagnosis but she acknowledges of being ill and hospitalized.  I have attempted to reach Sherry Hampton but unfortunately unable to talk to her.  I was able to talk to her granddaughter, apparently Sherry Hampton is in a process of  moving and not able to be reached by cell phone at this point.  Currently the ethics question is about proceeding with PEG tube placement without a signed consent.  Sherry Hampton has been hospitalized for 65 days, with a diagnosis of perforated abdominal viscus, Enterococcus urinary tract infection, metabolic encephalopathy, electrolyte abnormalities and severe protein calorie malnutrition. She had extended right colectomy with ileostomy performed November 17, 2021. During her acute illness patient received parenteral nutrition, fortunately now her gastrointestinal tract has been deemed to be functional. The surgical team has recommended to proceed with enteral nutrition because at this point enteral nutrition is superior to parenteral nutrition.   Sherry Hampton has been afebrile for the last 48 hours.  Infectious disease has recommended to continue surveillance of blood cultures.  Currently patient is off antibiotics. She has been considered medically stable and safe to proceed with PEG tube placement by interventional radiology.  I have reviewed Dr. Domingo Cocking documentation and talk to Dr. Lanny Hurst about patient's current condition.  Unable to obtain a sign consent for procedure, from her sister [name] or any other family member.  [name] is aware of the need of further nutrition via enteral route.  Verbally she has answered "yes" to the question about proceeding forward with PEG tube placement, but not signed the consent for the procedure.     I was able to reach Sherry Hampton, I explained him the  reason of my call about having difficulty finding a person from the family to sign consent for the PEG tube.  He said that he would be willing to review the consent documentation with his lawyer and he asked the document be sent to [email or web address]   I think that at this point proceeding with enteral nutrition is in the best interest of Sherry Hampton. This will provide appropriate nutrition and it is aligned with her goals for her care as expressed by her sister and previously documented. At this point after thorough review of risks versus benefit, if there is a continuous delay in the patient's family signing the consent for PEG tube insertion and it is affecting patient's care, it is reasonable to proceed with IR PEG tube placement without a formal signed consent.  I did explain to Sherry Hampton  that in situations where there is an intervention that needs to be made and it is in the best interest of the patient the healthcare team has the capacity to do the intervention without a formal signed consent as long as it is safe and it is aligned with the patient's goals of care.

## 2022-01-21 NOTE — Progress Notes (Signed)
Progress Note  65 Days Post-Op  Subjective: More interactive today, no significant changes. She has been afebrile for 48 hrs.   Objective: Vital signs in last 24 hours: Temp:  [98.6 F (37 C)-98.9 F (37.2 C)] 98.7 F (37.1 C) (01/31 0533) Pulse Rate:  [79-85] 81 (01/31 0533) Resp:  [15-18] 15 (01/31 0533) BP: (122-137)/(66-84) 129/83 (01/31 0533) SpO2:  [100 %] 100 % (01/31 0533) Last BM Date: 01/19/22  Intake/Output from previous day: 01/30 0701 - 01/31 0700 In: 1851.9 [P.O.:240; I.V.:1609.9; IV Piggyback:2] Out: 950 [Urine:750; Stool:200] Intake/Output this shift: No intake/output data recorded.  PE: General: resting, NAD, more interactive this AM Heart: regular Resp: normal work of breathing Abdomen: soft, nondistended, nontender to palpation. Midline dressing is clean and intact. Ileostomy productive. 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  SCDs in place   Lab Results:  Recent Labs    01/20/22 0428  WBC 6.0  HGB 8.5*  HCT 28.9*  PLT 260   BMET Recent Labs    01/20/22 0428 01/21/22 0357  NA 140 140  K 3.4* 3.4*  CL 110 111  CO2 24 26  GLUCOSE 100* 105*  BUN 8 5*  CREATININE <0.30* 0.31*  CALCIUM 9.5 9.7   PT/INR Recent Labs    01/20/22 1050  LABPROT 18.4*  INR 1.5*   CMP     Component Value Date/Time   NA 140 01/21/2022 0357   K 3.4 (L) 01/21/2022 0357   CL 111 01/21/2022 0357   CO2 26 01/21/2022 0357   GLUCOSE 105 (H) 01/21/2022 0357   BUN 5 (L) 01/21/2022 0357   CREATININE 0.31 (L) 01/21/2022 0357   CALCIUM 9.7 01/21/2022 0357   CALCIUM 10.9 (H) 01/01/2022 0449   PROT 7.5 01/20/2022 0428   ALBUMIN 1.7 (L) 01/20/2022 0428   AST 17 01/20/2022 0428   ALT 16 01/20/2022 0428   ALKPHOS 64 01/20/2022 0428   BILITOT 0.5 01/20/2022 0428   GFRNONAA >60 01/21/2022 0357   Lipase     Component Value Date/Time   LIPASE 22 11/17/2021 0955       Studies/Results: No  results found.  Anti-infectives: Anti-infectives (From admission, onward)    Start     Dose/Rate Route Frequency Ordered Stop   01/01/22 2100  piperacillin-tazobactam (ZOSYN) IVPB 3.375 g  Status:  Discontinued        3.375 g 12.5 mL/hr over 240 Minutes Intravenous Every 8 hours 01/01/22 2042 01/10/22 1333   11/25/21 1000  piperacillin-tazobactam (ZOSYN) IVPB 3.375 g  Status:  Discontinued        3.375 g 12.5 mL/hr over 240 Minutes Intravenous Every 8 hours 11/25/21 0909 12/01/21 0822   11/17/21 1830  piperacillin-tazobactam (ZOSYN) IVPB 3.375 g        3.375 g 12.5 mL/hr over 240 Minutes Intravenous Every 8 hours 11/17/21 1415 11/22/21 2359   11/17/21 1230  piperacillin-tazobactam (ZOSYN) IVPB 3.375 g        3.375 g 100 mL/hr over 30 Minutes Intravenous  Once 11/17/21 1220 11/17/21 1304        Assessment/Plan 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 Harlow Asa 11/27 - path: necrotic colon at site of perforation, no malignancy or dysplasia noted  - Ileostomy output improved. Continue BID metamucil.  - WOC following for ileostomy, mucous fistula - Wound improved with healthy granulation tissue present, no tracts or purulent  drainage present on dressing change this AM. Daily WTD dressing  - Pathology benign. CT distal descending/proximal sigmoid bowel luminal collapse. - CT 1/11 - some extravasated oral contrast in the subcutaneous abdominal wall superior and lateral to the ostomy at this level. There is inflammatory tissue at the level of the ostomy extending superiorly abutting the inferior tip of the liver and fundus of the gallbladder similar to the prior examination. There is likely some early fluid collection development anterior to the right kidney image 7/31 measuring 1.9 by 1.4 by 3.7 cm. Previously described fluid collections near the ostomy are not as well defined on the current study. Ill-defined  omental inflammation and fluid in the lower abdomen and left upper quadrant appears unchanged and may be related to omental infarct. There is no free intraperitoneal air or ascites. Small air-fluid collection at the level of the umbilicus is again seen containing air. This collection measures 2.5 x 0.7 by 1.3 cm and has mildly increased in size. Air in the collection is new from prior. - no collection around the ileostomy that is drainable - the best way to try to resolve this is abx and improving nutrition - zosyn completed for last CT scan findings and UTI on 1/20 - Dr. Hassell Done stopped her TNA on 1/17 - at this time, the patient has no medical need for TNA as she has a functional GI tract.  Parenteral nutrition carries many long-term risks  and enteral nutrition is superior; therefore, oral intake or intake via other means such as g-tube, would be needed henceforth.    -palliative saw the patient and it sounds like she is good with a feeding tube and thinks that is what her sister would want  - IR consult placed for percutaneous gastrostomy placement - they were unable to reach any family contacts this AM - fever curve improved and afebrile for last 48 hrs, urine cx with multiple species and will resend today, blood cx with staph 1/2 thought to be contaminate and repeat blood cxs sent 1/29   FEN - Dysphagia 2 diet/but won't eat   VTE - SCDs, Lovenox ID - zosyn 11/27>12/2, 12/5>12/11, restarted 1/11> 1/20 Foley - None currently    - encephalopathy/FTT -  On folic acid and thiamine supplementation. Appreciate TRH following as well.   - psych saw 12/27 and determined patient does not have capacity   - frequent urination - Urine Cx 1/10 with enterococcus faecalis sensitive to ampicillin, Zosyn completed. Urine Cx ordered 1/26 with multiple species, reordered 1/30.   Dispo: unclear at this time  LOS: 64 days    I reviewed nursing notes, Consultant Palliative care notes, hospitalist notes, last  24 h vitals and pain scores, last 48 h intake and output, and last 24 h labs and trends.   This care required moderate level of medical decision making.   LOS: 65 days     Norm Parcel, [facility] 01/21/2022, 10:18 AM Please see Amion for pager number during day hours 7:00am-4:30pm

## 2022-01-22 ENCOUNTER — Encounter (HOSPITAL_COMMUNITY): Payer: Self-pay | Admitting: Internal Medicine

## 2022-01-22 ENCOUNTER — Inpatient Hospital Stay (HOSPITAL_COMMUNITY): Payer: Medicare HMO

## 2022-01-22 DIAGNOSIS — I82413 Acute embolism and thrombosis of femoral vein, bilateral: Secondary | ICD-10-CM

## 2022-01-22 LAB — CULTURE, BLOOD (ROUTINE X 2)
Culture: NO GROWTH
Special Requests: ADEQUATE

## 2022-01-22 LAB — BASIC METABOLIC PANEL
Anion gap: 5 (ref 5–15)
BUN: 6 mg/dL — ABNORMAL LOW (ref 8–23)
CO2: 24 mmol/L (ref 22–32)
Calcium: 10.2 mg/dL (ref 8.9–10.3)
Chloride: 112 mmol/L — ABNORMAL HIGH (ref 98–111)
Creatinine, Ser: <0.3 mg/dL — ABNORMAL LOW (ref 0.44–1.00)
Glucose, Bld: 105 mg/dL — ABNORMAL HIGH (ref 70–99)
Potassium: 3.5 mmol/L (ref 3.5–5.1)
Sodium: 141 mmol/L (ref 135–145)

## 2022-01-22 LAB — MAGNESIUM: Magnesium: 1.8 mg/dL (ref 1.7–2.4)

## 2022-01-22 LAB — HEPARIN LEVEL (UNFRACTIONATED): Heparin Unfractionated: 0.27 IU/mL — ABNORMAL LOW (ref 0.30–0.70)

## 2022-01-22 MED ORDER — HEPARIN BOLUS VIA INFUSION
1000.0000 [IU] | Freq: Once | INTRAVENOUS | Status: AC
  Administered 2022-01-22: 1000 [IU] via INTRAVENOUS
  Filled 2022-01-22: qty 1000

## 2022-01-22 MED ORDER — MAGNESIUM SULFATE 2 GM/50ML IV SOLN
2.0000 g | Freq: Once | INTRAVENOUS | Status: AC
  Administered 2022-01-22: 2 g via INTRAVENOUS
  Filled 2022-01-22: qty 50

## 2022-01-22 MED ORDER — HEPARIN (PORCINE) 25000 UT/250ML-% IV SOLN
1550.0000 [IU]/h | INTRAVENOUS | Status: AC
  Administered 2022-01-22: 15:00:00 1250 [IU]/h via INTRAVENOUS
  Administered 2022-01-23: 1400 [IU]/h via INTRAVENOUS
  Filled 2022-01-22 (×3): qty 250

## 2022-01-22 MED ORDER — IOHEXOL 9 MG/ML PO SOLN
ORAL | Status: AC
  Filled 2022-01-22: qty 1000

## 2022-01-22 MED ORDER — IOHEXOL 9 MG/ML PO SOLN
500.0000 mL | ORAL | Status: AC
  Administered 2022-01-22: 500 mL via ORAL

## 2022-01-22 MED ORDER — IOHEXOL 300 MG/ML  SOLN
100.0000 mL | Freq: Once | INTRAMUSCULAR | Status: AC | PRN
  Administered 2022-01-22: 100 mL via INTRAVENOUS

## 2022-01-22 NOTE — Progress Notes (Signed)
[facility] called with CT results, PA notified.

## 2022-01-22 NOTE — Progress Notes (Signed)
ANTICOAGULATION CONSULT NOTE - Initial Consult  Pharmacy Consult for Heparin Indication: Bilateral femoral DVTs  No Known Allergies  Patient Measurements: Height: 5\' 10"  (177.8 cm) Weight: 79.7 kg (175 lb 11.3 oz) IBW/kg (Calculated) : 68.5 Heparin Dosing Weight: total weight  Vital Signs: Temp: 98.8 F (37.1 C) (02/01 2121) Temp Source: Oral (02/01 2121) BP: 143/95 (02/01 2121) Pulse Rate: 100 (02/01 2251)  Labs: Recent Labs    01/20/22 0428 01/20/22 1050 01/21/22 0357 01/22/22 0434 01/22/22 2157  HGB 8.5*  --   --   --   --   HCT 28.9*  --   --   --   --   PLT 260  --   --   --   --   LABPROT  --  18.4*  --   --   --   INR  --  1.5*  --   --   --   HEPARINUNFRC  --   --   --   --  0.27*  CREATININE <0.30*  --  0.31* <0.30*  --      CrCl cannot be calculated (This lab value cannot be used to calculate CrCl because it is not a number: <0.30).   Medical History: Past Medical History:  Diagnosis Date   Anemia    past hx    Arthritis    wrists , knees,  feet    Cataract    cordical cataracts per pt    Constipation    takes a stool softener  daily- as long as does this has daily soft stools    GERD (gastroesophageal reflux disease)    Hemorrhoids    Hyperlipidemia     Medications:  Scheduled:   chlorhexidine  15 mL Mouth Rinse BID   feeding supplement  237 mL Oral TID BM   ferrous sulfate  325 mg Oral BID WC   folic acid  1 mg Oral Daily   mouth rinse  15 mL Mouth Rinse q12n4p   multivitamin with minerals  1 tablet Oral Daily   niacin  100 mg Oral QHS   nutrition supplement (JUVEN)  1 packet Oral BID BM   pantoprazole  40 mg Oral QHS   potassium chloride  20 mEq Oral BID   psyllium  1 packet Oral BID   vitamin B-6  100 mg Oral Daily   thiamine  100 mg Oral BID   Infusions:   dextrose 5 % and 0.9 % NaCl with KCl 20 mEq/L 50 mL/hr at 01/22/22 0610   heparin 1,250 Units/hr (01/22/22 1444)   PRN: ibuprofen, ibuprofen, lip balm, ondansetron **OR**  ondansetron (ZOFRAN) IV, oxyCODONE  Assessment: 66 yo female admitted 11/17/21 with 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  Today 01/22/22, found to have bilateral femoral DVTs on CT scan.  Pharmacy consulted to dose IV heparin; has been receiving lovenox 40mg  q24h - last dose today at 07:20   01/22/22 Heparin level = 0.27 (subtherapeutic) with heparin gtt @ 1250 units/hr No line issues and no complications of therapy noted by RN  Goal of Therapy:  Heparin level 0.3-0.7 units/ml Monitor platelets by anticoagulation protocol: Yes   Plan:  Increase heparin gtt to 1400 units/hr Check heparin level 8 hr after rate increase Daily CBC and heparin level  Leone Haven, PharmD 01/22/2022,11:20 PM

## 2022-01-22 NOTE — Progress Notes (Signed)
°   01/22/22 2121  Vitals  Temp 98.8 F (37.1 C)  Temp Source Oral  BP (!) 143/95  MAP (mmHg) 108  BP Location Right Wrist  BP Method Automatic  Patient Position (if appropriate) Lying  Pulse Rate (!) 116  Pulse Rate Source Monitor  Resp 18  MEWS COLOR  MEWS Score Color Yellow  Oxygen Therapy  SpO2 100 %  O2 Device Room Air  MEWS Score  MEWS Temp 0  MEWS Systolic 0  MEWS Pulse 2  MEWS RR 0  MEWS LOC 0  MEWS Score 2  Provider Notification  Provider Name/Title J. Olena Heckle NP  Date Provider Notified 01/22/22  Time Provider Notified 2132  Notification Type  (secured chat)  Notification Reason  (yellow MEWs)

## 2022-01-22 NOTE — Progress Notes (Signed)
Progress Note  66 Days Post-Op  Subjective: Pt resting this AM. No acute changes, remains afebrile. See Ethics note from overnight and attending attestation yesterday for family communication.   Objective: Vital signs in last 24 hours: Temp:  [97.8 F (36.6 C)-99.3 F (37.4 C)] 97.8 F (36.6 C) (02/01 0546) Pulse Rate:  [89-92] 89 (02/01 0546) Resp:  [16] 16 (02/01 0546) BP: (124-133)/(81-84) 124/84 (02/01 0546) SpO2:  [99 %-100 %] 100 % (02/01 0546) Last BM Date: 01/19/22  Intake/Output from previous day: 01/31 0701 - 02/01 0700 In: 1154.5 [P.O.:20; I.V.:1134.5] Out: 1530 [Urine:1300; Stool:230] Intake/Output this shift: No intake/output data recorded.  PE: General: resting, NAD Heart: regular Resp: normal work of breathing Abdomen: soft, nondistended, nontender to palpation. Midline dressing is clean and intact. Ileostomy productive. 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  SCDs in place   Lab Results:  Recent Labs    01/20/22 0428  WBC 6.0  HGB 8.5*  HCT 28.9*  PLT 260   BMET Recent Labs    01/21/22 0357 01/22/22 0434  NA 140 141  K 3.4* 3.5  CL 111 112*  CO2 26 24  GLUCOSE 105* 105*  BUN 5* 6*  CREATININE 0.31* <0.30*  CALCIUM 9.7 10.2   PT/INR Recent Labs    01/20/22 1050  LABPROT 18.4*  INR 1.5*   CMP     Component Value Date/Time   NA 141 01/22/2022 0434   K 3.5 01/22/2022 0434   CL 112 (H) 01/22/2022 0434   CO2 24 01/22/2022 0434   GLUCOSE 105 (H) 01/22/2022 0434   BUN 6 (L) 01/22/2022 0434   CREATININE <0.30 (L) 01/22/2022 0434   CALCIUM 10.2 01/22/2022 0434   CALCIUM 10.9 (H) 01/01/2022 0449   PROT 7.5 01/20/2022 0428   ALBUMIN 1.7 (L) 01/20/2022 0428   AST 17 01/20/2022 0428   ALT 16 01/20/2022 0428   ALKPHOS 64 01/20/2022 0428   BILITOT 0.5 01/20/2022 0428   GFRNONAA NOT CALCULATED 01/22/2022 0434   Lipase     Component Value Date/Time   LIPASE 22 11/17/2021  0955       Studies/Results: No results found.  Anti-infectives: Anti-infectives (From admission, onward)    Start     Dose/Rate Route Frequency Ordered Stop   01/01/22 2100  piperacillin-tazobactam (ZOSYN) IVPB 3.375 g  Status:  Discontinued        3.375 g 12.5 mL/hr over 240 Minutes Intravenous Every 8 hours 01/01/22 2042 01/10/22 1333   11/25/21 1000  piperacillin-tazobactam (ZOSYN) IVPB 3.375 g  Status:  Discontinued        3.375 g 12.5 mL/hr over 240 Minutes Intravenous Every 8 hours 11/25/21 0909 12/01/21 0822   11/17/21 1830  piperacillin-tazobactam (ZOSYN) IVPB 3.375 g        3.375 g 12.5 mL/hr over 240 Minutes Intravenous Every 8 hours 11/17/21 1415 11/22/21 2359   11/17/21 1230  piperacillin-tazobactam (ZOSYN) IVPB 3.375 g        3.375 g 100 mL/hr over 30 Minutes Intravenous  Once 11/17/21 1220 11/17/21 1304        Assessment/Plan 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 Harlow Asa 11/27 - path: necrotic colon at site of perforation, no malignancy or dysplasia noted  - Ileostomy output improved. Continue BID metamucil.  - WOC following for ileostomy, mucous fistula - Wound improved with healthy granulation tissue present,  no tracts or purulent drainage present on dressing change this AM. Daily WTD dressing  - Pathology benign. CT distal descending/proximal sigmoid bowel luminal collapse. - CT 1/11 - some extravasated oral contrast in the subcutaneous abdominal wall superior and lateral to the ostomy at this level. There is inflammatory tissue at the level of the ostomy extending superiorly abutting the inferior tip of the liver and fundus of the gallbladder similar to the prior examination. There is likely some early fluid collection development anterior to the right kidney image 7/31 measuring 1.9 by 1.4 by 3.7 cm. Previously described fluid collections near the ostomy are not as well  defined on the current study. 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 There is no free intraperitoneal air or ascites. Small air-fluid collection at the level of the umbilicus is again seen containing air. This collection measures 2.5 x 0.7 by 1.3 cm and has mildly increased in size. Air in the collection is new from prior. - no collection around the ileostomy that is drainable - the best way to try to resolve this is abx and improving nutrition - zosyn completed for last CT scan findings and UTI on 1/20 - Dr. Hassell Done stopped her TNA on 1/17 - at this time, the patient has no medical need for TNA as she has a functional GI tract.  Parenteral nutrition carries many long-term risks  and enteral nutrition is superior; therefore, oral intake or intake via other means such as g-tube, would be needed henceforth.    -palliative saw the patient and it sounds like she is good with a feeding tube and thinks that is what her sister would want  - IR gastrostomy placement discussed with Dr. Annamaria Boots this AM who recommended repeat CT today prior to consideration of feeding tube placement - From Ethics note 1/31: I think that at this point proceeding with enteral nutrition is in the best interest of Mrs. Mcquary. This will provide appropriate nutrition and it is aligned with her goals for her care as expressed by her sister and previously documented.At this point after thorough review of risks versus benefit, if there is a continuous delay in the patient's family signing the consent for PEG tube insertion and it is affecting patient's care, it is reasonable to proceed with IR PEG tube placement without a formal signed consent. - fever curve improved and afebrile for last 72 hrs, urine cx with multiple species on repeat cx as well, blood cx with staph 1/2 thought to be contaminate and repeat blood cxs sent 1/29 with the  same   FEN - Dysphagia 2 diet/but won't eat   VTE - SCDs, Lovenox ID - zosyn 11/27>12/2, 12/5>12/11, restarted 1/11> 1/20 Foley - None currently    - encephalopathy/FTT -  On folic acid and thiamine supplementation. Appreciate TRH following as well.   - psych saw 12/27 and determined patient does not have capacity   - frequent urination - Urine Cx 1/10 with enterococcus faecalis sensitive to ampicillin, Zosyn completed. Urine Cx ordered 1/26 with multiple species, reordered 1/30.   Dispo: unclear at this time   I reviewed nursing notes, Consultant Palliative care notes, Ethics notes, hospitalist notes, last 24 h vitals and pain scores, last 48 h intake and output, and last 24 h labs and trends.   This care required moderate level of medical decision making.   LOS: 66 days   I reviewed Consultant  Ethics notes, hospitalist notes, last 24 h vitals and pain scores, and last 48 h intake and output.  This care required moderate level of medical decision making.    Norm Parcel, [facility] 01/22/2022, 9:10 AM Please see Amion for pager number during day hours 7:00am-4:30pm

## 2022-01-22 NOTE — Progress Notes (Signed)
PROGRESS NOTE    Sherry Hampton  MWN:[id number] DOB: 10-18-1956 DOA: 11/17/2021 PCP: Pcp, No    No chief complaint on file.   Brief Narrative: 66 years old female with history of hyperlipidemia and mild memory loss presented to hospital with acute abnormal pain.  She was noted to have perforated viscus of the ascending colon with fecal peritonitis and required right colectomy and ileostomy.  Hospital course was complicated by fluid collection in the abdomen malnutrition progressive decline and encephalopathy.  [facility] was consulted for assistance with general medical management.     Assessment & Plan:   Principal Problem:   Perforated abdominal viscus Active Problems:   GERD (gastroesophageal reflux disease)   Hyperlipidemia   Hyponatremia   Acute blood loss anemia   Acute metabolic encephalopathy   Protein-calorie malnutrition, severe   Delirium   Necrosis of colon with perforation    Ileostomy in place [facility])   Abnormal neurological exam   Memory loss   Hypercalcemia   Folate deficiency   Common bile duct dilatation   Enterococcus UTI   Goals of care, counseling/discussion   Vitamin B6 deficiency  #1 perforated abdominal viscus -Status post expiratory laparotomy with left colectomy and end ileostomy. -Per primary surgical team.  2.  Enterococcus UTI -Resolved -Status post completion of antibiotics recently.  3.  Episode of fever -Currently afebrile with T-max of 100 today but has since trended down. -Repeat blood cultures from 01/19/2022 with no growth x3 days and 1 bottle 1 bottle with staph epidermis likely contaminant, blood cultures from 01/17/2022 negative x5 days. -Chest x-ray done 01/16/2022 negative for any acute infiltrate. -Status post PICC line removal. -Patient recently completed course of antibiotics. -Dr. Louanne Belton spoke with Dr. Candiss Norse ID on 01/19/2022 who recommended repeat blood cultures which have been negative so far. -Continue to monitor off  antibiotics.  4.  Mild transaminitis -Improved.  5.  Acute metabolic encephalopathy  -MRI brain 12/11/2021 with no acute findings with repeat MRI 01/05/2022 negative. -EEG done showed slowing consistent with encephalopathy. -Neurology consulted during the hospitalization has since signed off. -Continue vitamin B6, folic acid.  6.  Vitamin B6 deficiency/folic acid deficiency -Continue supplementation.  7.  Hypomagnesemia/hypokalemia -Repleted.  8.  Hypercalcemia -Improved. -PTH RP less than 2.  9.  Severe protein calorie malnutrition/failure to thrive/poor oral intake -Primary team recommended PEG tube family, palliative care discussed with family. -Patient seen by ethics committee. -IR consulted for PEG tube placement. -Per primary team.  10.  Goals of care -Palliative care following.    DVT prophylaxis: SCDs Code Status: Full Family Communication: No family at bedside. Disposition:   Status is: Inpatient Remains inpatient appropriate because: Severity of illness           Consultants:  Consult: Dr. Cathlean Sauer 1/30-31/2023 [facility] Palliative care Psychiatry Neurology  Procedures:  Status post exploratory laparotomy with right colectomy and ileostomy on 11/17/2021  Antimicrobials:  Anti-infectives (From admission, onward)    Start     Dose/Rate Route Frequency Ordered Stop   01/01/22 2100  piperacillin-tazobactam (ZOSYN) IVPB 3.375 g  Status:  Discontinued        3.375 g 12.5 mL/hr over 240 Minutes Intravenous Every 8 hours 01/01/22 2042 01/10/22 1333   11/25/21 1000  piperacillin-tazobactam (ZOSYN) IVPB 3.375 g  Status:  Discontinued        3.375 g 12.5 mL/hr over 240 Minutes Intravenous Every 8 hours 11/25/21 0909 12/01/21 0822   11/17/21 1830  piperacillin-tazobactam (ZOSYN) IVPB 3.375 g  3.375 g 12.5 mL/hr over 240 Minutes Intravenous Every 8 hours 11/17/21 1415 11/22/21 2359   11/17/21 1230  piperacillin-tazobactam (ZOSYN) IVPB  3.375 g        3.375 g 100 mL/hr over 30 Minutes Intravenous  Once 11/17/21 1220 11/17/21 1304         Subjective: Drowsy but arousable.  Just returning from IR.  Denies any chest pain.  No shortness of breath.  Objective: Vitals:   01/21/22 1337 01/21/22 2134 01/22/22 0546 01/22/22 1421  BP: 133/81 126/83 124/84 138/83  Pulse: 92 91 89 94  Resp:  16 16 14   Temp: 99.3 F (37.4 C) 98.5 F (36.9 C) 97.8 F (36.6 C) 100 F (37.8 C)  TempSrc: Oral Oral  Oral  SpO2: 100% 99% 100% 99%  Weight:      Height:        Intake/Output Summary (Last 24 hours) at 01/22/2022 1608 Last data filed at 01/22/2022 1444 Gross per 24 hour  Intake 1100.74 ml  Output 1290 ml  Net -189.26 ml   Filed Weights   12/24/21 0500 12/30/21 0635 01/20/22 0918  Weight: 77.9 kg 73.3 kg 79.7 kg    Examination:  General exam: Drowsy but arousable.  Frail.  Deconditioned. Respiratory system: Clear to auscultation anterior lung fields.Marland Kitchen Respiratory effort normal. Cardiovascular system: S1 & S2 heard, RRR. No JVD, murmurs, rubs, gallops or clicks. No pedal edema. Gastrointestinal system: Abdomen is nondistended, soft and nontender.  Ileostomy in place, midline incision. 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 Normal bowel sounds heard. Central nervous system: Alert and oriented. No focal neurological deficits. Extremities: Symmetric 5 x 5 power. Skin: No rashes, lesions or ulcers Psychiatry: Judgement and insight appear poor to fair. Mood & affect appropriate.     Data Reviewed: I have personally reviewed following labs and imaging studies  CBC: Recent Labs  Lab 01/16/22 0341 01/18/22 0449 01/20/22 0428  WBC 5.9 6.9 6.0  HGB 8.2* 9.2* 8.5*  HCT 27.2* 31.4* 28.9*  MCV 87.7 89.5 88.1  PLT 306 308 672    Basic Metabolic Panel: Recent Labs  Lab 01/18/22 0449 01/20/22 0428 01/21/22 0357 01/22/22 0434  NA 139 140 140 141  K 3.7 3.4* 3.4* 3.5  CL 109 110 111 112*  CO2 26  24 26 24   GLUCOSE 109* 100* 105* 105*  BUN 14 8 5* 6*  CREATININE 0.37* <0.30* 0.31* <0.30*  CALCIUM 10.0 9.5 9.7 10.2  MG  --  1.6* 1.9 1.8    GFR: CrCl cannot be calculated (This lab value cannot be used to calculate CrCl because it is not a number: <0.30).  Liver Function Tests: Recent Labs  Lab 01/20/22 0428  AST 17  ALT 16  ALKPHOS 64  BILITOT 0.5  PROT 7.5  ALBUMIN 1.7*    CBG: No results for input(s): GLUCAP in the last 168 hours.   Recent Results (from the past 240 hour(s))  Urine Culture     Status: Abnormal   Collection Time: 01/16/22  5:44 PM   Specimen: Urine, Clean Catch  Result Value Ref Range Status   Specimen Description   Final    URINE, CLEAN CATCH Performed at [facility], [place] [address] [place], [place] [address]    Special Requests   Final    NONE Performed at [facility], [place] [address], [place],  [address]    Culture MULTIPLE SPECIES PRESENT, SUGGEST RECOLLECTION (A)  Final  Report Status 01/18/2022 FINAL  Final  Culture, blood (Routine X 2) w Reflex to ID Panel     Status: None   Collection Time: 01/17/22  6:40 AM   Specimen: BLOOD LEFT HAND  Result Value Ref Range Status   Specimen Description   Final    BLOOD LEFT HAND Performed at [facility] [address] [place], [place] [address]    Special Requests   Final    BOTTLES DRAWN AEROBIC ONLY Blood Culture results may not be optimal due to an inadequate volume of blood received in culture bottles Performed at [place] [address], [place], [place] [address]    Culture   Final    NO GROWTH 5 DAYS Performed at [facility], [place] [address], [place], [place] [address]    Report Status 01/22/2022 FINAL  Final  Culture, blood (Routine X 2) w Reflex to ID Panel     Status: Abnormal   Collection Time: 01/17/22  7:06 AM   Specimen: BLOOD  Result Value Ref Range Status   Specimen  Description   Final    BLOOD RIGHT WRIST Performed at [place] [address], [place], [place] [address]    Special Requests   Final    BOTTLES DRAWN AEROBIC ONLY Blood Culture results may not be optimal due to an inadequate volume of blood received in culture bottles Performed at [place] [address], [place], [place] [address]    Culture  Setup Time   Final    GRAM POSITIVE COCCI IN CLUSTERS AEROBIC BOTTLE ONLY CRITICAL RESULT CALLED TO, READ BACK BY AND VERIFIED WITH: J,GADHIA PHARMD @1050  01/18/22 EB    Culture (A)  Final    STAPHYLOCOCCUS EPIDERMIDIS THE SIGNIFICANCE OF ISOLATING THIS ORGANISM FROM A SINGLE SET OF BLOOD CULTURES WHEN MULTIPLE SETS ARE DRAWN IS UNCERTAIN. PLEASE NOTIFY THE MICROBIOLOGY DEPARTMENT WITHIN ONE WEEK IF SPECIATION AND SENSITIVITIES ARE REQUIRED. Performed at [facility], [place] [address], [place], [place] [address]    Report Status 01/20/2022 FINAL  Final  Blood Culture ID Panel (Reflexed)     Status: Abnormal   Collection Time: 01/17/22  7:06 AM  Result Value Ref Range Status   Enterococcus faecalis NOT DETECTED NOT DETECTED Final   Enterococcus Faecium NOT DETECTED NOT DETECTED Final   Listeria monocytogenes NOT DETECTED NOT DETECTED Final   Staphylococcus species DETECTED (A) NOT DETECTED Final    Comment: CRITICAL RESULT CALLED TO, READ BACK BY AND VERIFIED WITH: J,GADHIA PHARMD @1050  01/18/22 EB    Staphylococcus aureus (BCID) NOT DETECTED NOT DETECTED Final   Staphylococcus epidermidis DETECTED (A) NOT DETECTED Final    Comment: Methicillin (oxacillin) resistant coagulase negative staphylococcus. Possible blood culture contaminant (unless isolated from more than one blood culture draw or clinical case suggests pathogenicity). No antibiotic treatment is indicated for blood  culture contaminants. CRITICAL RESULT CALLED TO, READ BACK BY AND VERIFIED WITH: J,GADHIA PHARMD @1050  01/18/22 EB     Staphylococcus lugdunensis NOT DETECTED NOT DETECTED Final   Streptococcus species NOT DETECTED NOT DETECTED Final   Streptococcus agalactiae NOT DETECTED NOT DETECTED Final   Streptococcus pneumoniae NOT DETECTED NOT DETECTED Final   Streptococcus pyogenes NOT DETECTED NOT DETECTED Final   A.calcoaceticus-baumannii NOT DETECTED NOT DETECTED Final   Bacteroides fragilis NOT DETECTED NOT DETECTED Final   Enterobacterales NOT DETECTED NOT DETECTED Final   Enterobacter cloacae complex NOT DETECTED NOT DETECTED Final  Escherichia coli NOT DETECTED NOT DETECTED Final   Klebsiella aerogenes NOT DETECTED NOT DETECTED Final   Klebsiella oxytoca NOT DETECTED NOT DETECTED Final   Klebsiella pneumoniae NOT DETECTED NOT DETECTED Final   Proteus species NOT DETECTED NOT DETECTED Final   Salmonella species NOT DETECTED NOT DETECTED Final   Serratia marcescens NOT DETECTED NOT DETECTED Final   Haemophilus influenzae NOT DETECTED NOT DETECTED Final   Neisseria meningitidis NOT DETECTED NOT DETECTED Final   Pseudomonas aeruginosa NOT DETECTED NOT DETECTED Final   Stenotrophomonas maltophilia NOT DETECTED NOT DETECTED Final   Candida albicans NOT DETECTED NOT DETECTED Final   Candida auris NOT DETECTED NOT DETECTED Final   Candida glabrata NOT DETECTED NOT DETECTED Final   Candida krusei NOT DETECTED NOT DETECTED Final   Candida parapsilosis NOT DETECTED NOT DETECTED Final   Candida tropicalis NOT DETECTED NOT DETECTED Final   Cryptococcus neoformans/gattii NOT DETECTED NOT DETECTED Final   Methicillin resistance mecA/C DETECTED (A) NOT DETECTED Final    Comment: CRITICAL RESULT CALLED TO, READ BACK BY AND VERIFIED WITH: J,GADHIA PHARMD @1050  01/18/22 EB Performed at [facility], [address], [place], [place] [address]   Culture, blood (routine x 2)     Status: Abnormal   Collection Time: 01/19/22 11:27 AM   Specimen: BLOOD  Result Value Ref Range Status   Specimen Description   Final     BLOOD BLOOD RIGHT HAND Performed at [place] [address], [place], [place] [address]    Special Requests   Final    BOTTLES DRAWN AEROBIC ONLY Blood Culture adequate volume Performed at [place] [address] [place], [place] [address]    Culture  Setup Time   Final    GRAM POSITIVE COCCI IN CLUSTERS AEROBIC BOTTLE ONLY CRITICAL VALUE NOTED.  VALUE IS CONSISTENT WITH PREVIOUSLY REPORTED AND CALLED VALUE. Performed at [facility], [place] [address], [place], [place] [address]    Culture STAPHYLOCOCCUS EPIDERMIDIS (A)  Final   Report Status 01/22/2022 FINAL  Final   Organism ID, Bacteria STAPHYLOCOCCUS EPIDERMIDIS  Final      Susceptibility   Staphylococcus epidermidis - MIC*    CIPROFLOXACIN >=8 RESISTANT Resistant     ERYTHROMYCIN >=8 RESISTANT Resistant     GENTAMICIN <=0.5 SENSITIVE Sensitive     OXACILLIN >=4 RESISTANT Resistant     TETRACYCLINE 2 SENSITIVE Sensitive     VANCOMYCIN 1 SENSITIVE Sensitive     TRIMETH/SULFA 80 RESISTANT Resistant     CLINDAMYCIN >=8 RESISTANT Resistant     RIFAMPIN <=0.5 SENSITIVE Sensitive     Inducible Clindamycin NEGATIVE Sensitive     * STAPHYLOCOCCUS EPIDERMIDIS  Culture, blood (routine x 2)     Status: None (Preliminary result)   Collection Time: 01/19/22 11:33 AM   Specimen: BLOOD  Result Value Ref Range Status   Specimen Description   Final    BLOOD BLOOD LEFT HAND Performed at [place] [address], [place], [place] [address]    Special Requests   Final    BOTTLES DRAWN AEROBIC ONLY Blood Culture results may not be optimal due to an inadequate volume of blood received in culture bottles Performed at [place] [address], [place], [place] [address]    Culture   Final    NO GROWTH 3 DAYS Performed at [facility], [place] [address], [place], [place] [address]    Report Status PENDING  Incomplete  Urine Culture      Status: Abnormal   Collection Time: 01/20/22 12:41 PM   Specimen: In/Out Cath Urine  Result Value Ref Range Status   Specimen Description   Final    IN/OUT CATH URINE Performed at [facility], [place] [address] [place], [place] [address]    Special Requests   Final    NONE Performed at [facility], [place] [address] [place], [place] [address]    Culture MULTIPLE SPECIES PRESENT, SUGGEST RECOLLECTION (A)  Final   Report Status 01/21/2022 FINAL  Final         Radiology Studies: CT ABDOMEN PELVIS W CONTRAST  Result Date: 01/22/2022 CLINICAL DATA:  Postoperative abdominal pain, status post right colectomy secondary to perforated colon 11/17/2021 EXAM: CT ABDOMEN AND PELVIS WITH CONTRAST TECHNIQUE: Multidetector CT imaging of the abdomen and pelvis was performed using the standard protocol following bolus administration of intravenous contrast. RADIATION DOSE REDUCTION: This exam was performed according to the departmental dose-optimization program which includes automated exposure control, adjustment of the mA and/or kV according to patient size and/or use of iterative reconstruction technique. CONTRAST:  118mL OMNIPAQUE IOHEXOL 300 MG/ML  SOLN COMPARISON:  01/01/2022 FINDINGS: Lower chest: No acute abnormality. Coronary artery calcifications. Unchanged trace right pleural effusion associated atelectasis or consolidation. Hepatobiliary: No solid liver abnormality is seen. No gallstones, gallbladder wall thickening, or biliary dilatation. Pancreas: Unremarkable. No pancreatic ductal dilatation or surrounding inflammatory changes. Spleen: Normal in size without significant abnormality. Adrenals/Urinary Tract: Adrenal glands are unremarkable. Small nonobstructive calculi in the inferior pole of the left kidney (series 4, image 52). Bladder is unremarkable. Stomach/Bowel: Stomach is within normal limits. Appendix appears normal. Status post right colectomy with right  lower quadrant end ileostomy and left hemiabdominal transverse mucous fistula. Diverticula of the remaining distal ileum (series 2, image 62) and of the remaining descending and sigmoid colon. Chronic wall thickening of the sigmoid colon without evidence of acute diverticulitis. Vascular/Lymphatic: Scattered aortic atherosclerosis. Expansile, hypodense thrombus within the bilateral common femoral veins, with fat stranding about the vessels (series 2, image 87). No enlarged abdominal or pelvic lymph nodes. Reproductive: Calcified uterine fibroids. Other: No abdominal wall hernia or abnormality. No ascites. There is near complete resolution of previously seen heterogeneous fat stranding and fluid in the low right hemiabdomen, with some residual, tubular and bandlike elements in this vicinity (series 2, image 73). Musculoskeletal: No acute or significant osseous findings. IMPRESSION: 1. 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 2. Near complete resolution of previously seen heterogeneous fat stranding and fluid in the low right hemiabdomen, with some residual, tubular and bandlike elements in this vicinity. Findings are consistent with resolving postoperative hematoma/seroma with fat necrosis. There is no discrete fluid collection within the abdomen at this time. 3. Ileal and colonic diverticulosis without evidence of acute diverticulitis 4. Incidental note of expansile, hypodense thrombus within the bilateral common femoral veins, with fat stranding about the vessels. 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 5. Nonobstructive left nephrolithiasis. These results will be called to the ordering clinician or representative by the Radiologist Assistant, and communication documented in the PACS or [employer]. Aortic Atherosclerosis (ICD10-I70.0). Electronically Signed   By: Delanna Ahmadi M.D.   On: 01/22/2022 13:49         Scheduled Meds:  chlorhexidine  15 mL Mouth Rinse BID   feeding supplement  237 mL Oral TID BM  ferrous sulfate  325 mg Oral BID WC   folic acid  1 mg Oral Daily   iohexol       mouth rinse  15 mL Mouth Rinse q12n4p   multivitamin with minerals  1 tablet Oral Daily   niacin  100 mg Oral QHS   nutrition supplement (JUVEN)  1 packet Oral BID BM   pantoprazole  40 mg Oral QHS   potassium chloride  20 mEq Oral BID   psyllium  1 packet Oral BID   vitamin B-6  100 mg Oral Daily   thiamine  100 mg Oral BID   Continuous Infusions:  dextrose 5 % and 0.9 % NaCl with KCl 20 mEq/L 50 mL/hr at 01/22/22 0610   heparin 1,250 Units/hr (01/22/22 1444)     LOS: 66 days    Time spent: 35 minutes    Irine Seal, MD [facility]   To contact the attending provider between 7A-7P or the covering provider during after hours 7P-7A, please log into the web site [email or web address] and access using universal [place] password for that web site. If you do not have the password, please call the hospital operator.  01/22/2022, 4:08 PM

## 2022-01-22 NOTE — Progress Notes (Signed)
ANTICOAGULATION CONSULT NOTE - Initial Consult  Pharmacy Consult for Heparin Indication: Bilateral femoral DVTs  No Known Allergies  Patient Measurements: Height: 5\' 10"  (177.8 cm) Weight: 79.7 kg (175 lb 11.3 oz) IBW/kg (Calculated) : 68.5 Heparin Dosing Weight: total weight  Vital Signs: Temp: 97.8 F (36.6 C) (02/01 0546) BP: 124/84 (02/01 0546) Pulse Rate: 89 (02/01 0546)  Labs: Recent Labs    01/20/22 0428 01/20/22 1050 01/21/22 0357 01/22/22 0434  HGB 8.5*  --   --   --   HCT 28.9*  --   --   --   PLT 260  --   --   --   LABPROT  --  18.4*  --   --   INR  --  1.5*  --   --   CREATININE <0.30*  --  0.31* <0.30*    CrCl cannot be calculated (This lab value cannot be used to calculate CrCl because it is not a number: <0.30).   Medical History: Past Medical History:  Diagnosis Date   Anemia    past hx    Arthritis    wrists , knees,  feet    Cataract    cordical cataracts per pt    Constipation    takes a stool softener  daily- as long as does this has daily soft stools    GERD (gastroesophageal reflux disease)    Hemorrhoids    Hyperlipidemia     Medications:  Scheduled:   chlorhexidine  15 mL Mouth Rinse BID   enoxaparin (LOVENOX) injection  40 mg Subcutaneous Q24H   feeding supplement  237 mL Oral TID BM   ferrous sulfate  325 mg Oral BID WC   folic acid  1 mg Oral Daily   iohexol       mouth rinse  15 mL Mouth Rinse q12n4p   multivitamin with minerals  1 tablet Oral Daily   niacin  100 mg Oral QHS   nutrition supplement (JUVEN)  1 packet Oral BID BM   pantoprazole  40 mg Oral QHS   potassium chloride  20 mEq Oral BID   psyllium  1 packet Oral BID   vitamin B-6  100 mg Oral Daily   thiamine  100 mg Oral BID   Infusions:   dextrose 5 % and 0.9 % NaCl with KCl 20 mEq/L 50 mL/hr at 01/22/22 0610   PRN: ibuprofen, ibuprofen, lip balm, ondansetron **OR** ondansetron (ZOFRAN) IV, oxyCODONE  Assessment: 66 yo female admitted 11/17/21 with  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  Today 01/22/22, found to have bilateral femoral DVTs on CT scan.  Pharmacy consulted to dose IV heparin; has been receiving lovenox 40mg  q24h - last dose today at 07:20  PT/INR on 1/30 = 18.4/1.5 CBC: Hgb 8.5 is low but stable, Plts wnl SCr < 0.3 No bleeding reported  Goal of Therapy:  Heparin level 0.3-0.7 units/ml Monitor platelets by anticoagulation protocol: Yes   Plan:  Give 1000 units bolus x 1 Start heparin infusion at 1250 units/hr Check anti-Xa level in 8 hours and daily while on heparin Continue to monitor H&H and platelets  Peggyann Juba, PharmD, BCPS Pharmacy: [phone] 01/22/2022,2:13 PM

## 2022-01-23 LAB — CBC
HCT: 30.9 % — ABNORMAL LOW (ref 36.0–46.0)
Hemoglobin: 9.1 g/dL — ABNORMAL LOW (ref 12.0–15.0)
MCH: 26 pg (ref 26.0–34.0)
MCHC: 29.4 g/dL — ABNORMAL LOW (ref 30.0–36.0)
MCV: 88.3 fL (ref 80.0–100.0)
Platelets: 322 10*3/uL (ref 150–400)
RBC: 3.5 MIL/uL — ABNORMAL LOW (ref 3.87–5.11)
RDW: 18.3 % — ABNORMAL HIGH (ref 11.5–15.5)
WBC: 6.3 10*3/uL (ref 4.0–10.5)
nRBC: 0 % (ref 0.0–0.2)

## 2022-01-23 LAB — HEPARIN LEVEL (UNFRACTIONATED)
Heparin Unfractionated: 0.3 IU/mL (ref 0.30–0.70)
Heparin Unfractionated: 0.25 IU/mL — ABNORMAL LOW (ref 0.30–0.70)

## 2022-01-23 LAB — MAGNESIUM: Magnesium: 1.9 mg/dL (ref 1.7–2.4)

## 2022-01-23 NOTE — Progress Notes (Addendum)
ANTICOAGULATION CONSULT NOTE - Initial Consult  Pharmacy Consult for Heparin Indication: Bilateral femoral DVTs  No Known Allergies  Patient Measurements: Height: 5\' 10"  (177.8 cm) Weight: 79.7 kg (175 lb 11.3 oz) IBW/kg (Calculated) : 68.5 Heparin Dosing Weight: total weight  Vital Signs: Temp: 97.4 F (36.3 C) (02/02 0606) Temp Source: Oral (02/02 0606) BP: 124/79 (02/02 0606) Pulse Rate: 105 (02/02 0606)  Labs: Recent Labs    01/20/22 1050 01/21/22 0357 01/22/22 0434 01/22/22 2157 01/23/22 0446 01/23/22 0707  HGB  --   --   --   --  9.1*  --   HCT  --   --   --   --  30.9*  --   PLT  --   --   --   --  322  --   LABPROT 18.4*  --   --   --   --   --   INR 1.5*  --   --   --   --   --   HEPARINUNFRC  --   --   --  0.27*  --  0.30  CREATININE  --  0.31* <0.30*  --   --   --      CrCl cannot be calculated (This lab value cannot be used to calculate CrCl because it is not a number: <0.30).   Medical History: Past Medical History:  Diagnosis Date   Anemia    past hx    Arthritis    wrists , knees,  feet    Cataract    cordical cataracts per pt    Constipation    takes a stool softener  daily- as long as does this has daily soft stools    GERD (gastroesophageal reflux disease)    Hemorrhoids    Hyperlipidemia     Medications:  Scheduled:   chlorhexidine  15 mL Mouth Rinse BID   feeding supplement  237 mL Oral TID BM   ferrous sulfate  325 mg Oral BID WC   folic acid  1 mg Oral Daily   mouth rinse  15 mL Mouth Rinse q12n4p   multivitamin with minerals  1 tablet Oral Daily   niacin  100 mg Oral QHS   nutrition supplement (JUVEN)  1 packet Oral BID BM   pantoprazole  40 mg Oral QHS   potassium chloride  20 mEq Oral BID   psyllium  1 packet Oral BID   vitamin B-6  100 mg Oral Daily   thiamine  100 mg Oral BID   Infusions:   dextrose 5 % and 0.9 % NaCl with KCl 20 mEq/L 50 mL/hr at 01/23/22 0614   heparin Stopped (01/23/22 0729)   PRN: ibuprofen,  ibuprofen, lip balm, ondansetron **OR** ondansetron (ZOFRAN) IV, oxyCODONE  Assessment: 65 yo female admitted 11/17/21 with 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  Today 01/22/22, found to have bilateral femoral DVTs on CT scan.  Pharmacy consulted to dose IV heparin; has been receiving lovenox 40mg  q24h - last dose today at 07:20   01/23/22 Heparin level drawn ~0700 = 0.30 (therapeutic, but low end of goal) with heparin gtt @ 1400 units/hr Per discussion with RN, heparin drip was stopped ~0715 this AM for PEG tube placement today w/ IR  Goal of Therapy:  Heparin level 0.3-0.7 units/ml Monitor platelets by anticoagulation protocol: Yes   Plan:  F/u ability to resume heparin drip after PEG placement Daily CBC Monitor for s/sx bleeding F/u ability to switch  to oral anticoagulation  Dimple Nanas, PharmD 01/23/2022 8:20 AM  ADDENDUM: - D/w CCS - will not go for PEG tube placement today - Heparin drip was resumed at 1400 units/hr @ ~0915 this AM. - Check confirmatory HL 8 hours from heparin restart.   Dimple Nanas, PharmD 01/23/2022 12:33 PM  ADDENDUM: - D/w [facility] - will proceed with PEG tube placement 2/3 - Hold heparin 2/3 @ [place], PharmD 01/23/2022 1:45 PM

## 2022-01-23 NOTE — Progress Notes (Signed)
Phone consent received for PEG tube placement from sister [name], witnessed by myself and Tasia Catchings, Therapist, sports

## 2022-01-23 NOTE — Progress Notes (Addendum)
Nutrition Follow-up  DOCUMENTATION CODES:   Severe malnutrition in context of acute illness/injury  INTERVENTION:   Monitor magnesium, potassium, and phosphorus BID for at least 3 days or until pt meets goal rate, MD to replete as needed, as pt is at risk for refeeding syndrome.  -If NGT being placed, recommend small bore  TF recommendations: -Initiate Osmolite 1.5 @ 20 ml/hr and advance by 10 ml every 24 hours to goal rate of 60 ml/hr. -provides 2160 kcals, 90g protein and 1097 ml H2O -Will need free water flushes of 200 ml every 6 hours (800 ml)  -Provide Multivitamin with minerals daily via tube  -D/c Ensure and Juven  NUTRITION DIAGNOSIS:   Severe Malnutrition related to acute illness as evidenced by energy intake < or equal to 50% for > or equal to 5 days, moderate fat depletion, severe muscle depletion.  Ongoing. Now likely related to chronic illness. Energy intake <75% for >/= 1 month.  GOAL:   Patient will meet greater than or equal to 90% of their needs  Not meeting.  MONITOR:   PO intake, Supplement acceptance, Labs, Weight trends, I & O's Enteral nutrition initiation  ASSESSMENT:   66 year old female who presents to the emergency department with sudden onset of abdominal pain and distention.CT scan of abdomen and pelvis shows findings consistent with free air with a large pneumoperitoneum.  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  11/27: s/p ex lap, right colectomy, ileostomy 12/11: PICC placed 12/12: TPN initiated 1/17: TPN discontinued 1/26: PICC removed  Pt now NPO. Per surgery note, G-tube was recommended via ethics committee decision. MD in IR recommending NGT placement. Will continue to monitor what is decided.  TF recommendations above for when tube ready to use. Pt will be at high refeeding syndrome risk.   Medications: Ferrous sulfate, Folic acid, Multivitamin with minerals daily, KLOR-CON, niacin, Metamucil,  vitamin B-6, Thiamine, D5 infusion (~204 kcals)  Labs reviewed.  Diet Order:   Diet Order             Diet NPO time specified  Diet effective now                   EDUCATION NEEDS:   Education needs have been addressed  Skin:  Skin Assessment: Skin Integrity Issues: Skin Integrity Issues:: Incisions, Other (Comment) Incisions: 11/27 abdomen Other: left thigh pressure injury  Last BM:  2/2 -ileostomy  Height:   Ht Readings from Last 1 Encounters:  11/17/21 5\' 10"  (1.778 m)    Weight:   Wt Readings from Last 1 Encounters:  01/20/22 79.7 kg    BMI:  Body mass index is 25.21 kg/m.  Estimated Nutritional Needs:   Kcal:  1950-2150  Protein:  95-105g  Fluid:  2L/day  Clayton Bibles, MS, RD, LDN Inpatient Clinical Dietitian Contact information available via Amion

## 2022-01-23 NOTE — Progress Notes (Signed)
PROGRESS NOTE    Sherry Hampton  EXN:[id number] DOB: 1956-03-21 DOA: 11/17/2021 PCP: Pcp, No    No chief complaint on file.   Brief Narrative: 66 years old female with history of hyperlipidemia and mild memory loss presented to hospital with acute abnormal pain.  She was noted to have perforated viscus of the ascending colon with fecal peritonitis and required right colectomy and ileostomy.  Hospital course was complicated by fluid collection in the abdomen malnutrition progressive decline and encephalopathy.  TRH was consulted for assistance with general medical management.     Assessment & Plan:   Principal Problem:   Perforated abdominal viscus Active Problems:   GERD (gastroesophageal reflux disease)   Hyperlipidemia   Hyponatremia   Acute blood loss anemia   Acute metabolic encephalopathy   Protein-calorie malnutrition, severe   Delirium   Necrosis of colon with perforation    Ileostomy in place [facility])   Abnormal neurological exam   Memory loss   Hypercalcemia   Folate deficiency   Common bile duct dilatation   Enterococcus UTI   Goals of care, counseling/discussion   Vitamin B6 deficiency  #1 perforated abdominal viscus -Status post expiratory laparotomy with left colectomy and end ileostomy. -Per primary surgical team.  2.  Enterococcus UTI -Resolved -Status post completion of antibiotics recently.  3.  Episode of fever -Currently afebrile with T-max of 100 today but has since trended down. -Repeat blood cultures from 01/19/2022 with no growth x3 days and 1 bottle 1 bottle with staph epidermis likely contaminant, blood cultures from 01/17/2022 negative x5 days. -Chest x-ray done 01/16/2022 negative for any acute infiltrate. -Status post PICC line removal. -Patient recently completed course of antibiotics. -Dr. Louanne Belton spoke with Dr. Candiss Norse ID on 01/19/2022 who recommended repeat blood cultures which have been negative so far. -Continue to monitor off  antibiotics.  4.  Mild transaminitis -Improved.  5.  Acute metabolic encephalopathy  -MRI brain 12/11/2021 with no acute findings with repeat MRI 01/05/2022 negative. -EEG done showed slowing consistent with encephalopathy. -Neurology consulted during the hospitalization has since signed off. -Continue vitamin B6, folic acid.  6.  Vitamin B6 deficiency/folic acid deficiency -Continue supplementation.   7.  Hypomagnesemia/hypokalemia -Repleted.  8.  Hypercalcemia -Improved. -PTH RP less than 2.  9.  Severe protein calorie malnutrition/failure to thrive/poor oral intake -Primary team recommended PEG tube family, palliative care discussed with family. -Patient seen by ethics committee. -IR consulted for PEG tube placement early on, patient being followed by general surgery and general surgery to place PEG tube tomorrow and endoscopy to provide enteral feeding.. -Per primary team.  10.  Goals of care -Palliative care following.    DVT prophylaxis: SCDs Code Status: Full Family Communication: No family at bedside. Disposition:   Status is: Inpatient Remains inpatient appropriate because: Severity of illness           Consultants:  Consult: Dr. Cathlean Sauer 1/30-31/2023 [facility] Palliative care Psychiatry Neurology  Procedures:  Status post exploratory laparotomy with right colectomy and ileostomy on 11/17/2021  Antimicrobials:  Anti-infectives (From admission, onward)    Start     Dose/Rate Route Frequency Ordered Stop   01/01/22 2100  piperacillin-tazobactam (ZOSYN) IVPB 3.375 g  Status:  Discontinued        3.375 g 12.5 mL/hr over 240 Minutes Intravenous Every 8 hours 01/01/22 2042 01/10/22 1333   11/25/21 1000  piperacillin-tazobactam (ZOSYN) IVPB 3.375 g  Status:  Discontinued        3.375 g 12.5 mL/hr  over 240 Minutes Intravenous Every 8 hours 11/25/21 0909 12/01/21 0822   11/17/21 1830  piperacillin-tazobactam (ZOSYN) IVPB 3.375 g        3.375  g 12.5 mL/hr over 240 Minutes Intravenous Every 8 hours 11/17/21 1415 11/22/21 2359   11/17/21 1230  piperacillin-tazobactam (ZOSYN) IVPB 3.375 g        3.375 g 100 mL/hr over 30 Minutes Intravenous  Once 11/17/21 1220 11/17/21 1304         Subjective: Sleeping but arousable.  Denies any chest pain.  No shortness of breath.   Objective: Vitals:   01/22/22 1421 01/22/22 2121 01/22/22 2251 01/23/22 0606  BP: 138/83 (!) 143/95  124/79  Pulse: 94 (!) 116 100 (!) 105  Resp: 14 18  16   Temp: 100 F (37.8 C) 98.8 F (37.1 C)  (!) 97.4 F (36.3 C)  TempSrc: Oral Oral  Oral  SpO2: 99% 100%  99%  Weight:      Height:        Intake/Output Summary (Last 24 hours) at 01/23/2022 1234 Last data filed at 01/23/2022 0900 [place] per 24 hour  Intake 391.29 ml  Output 1570 ml  Net -1178.71 ml    Filed Weights   12/24/21 0500 12/30/21 0635 01/20/22 0918  Weight: 77.9 kg 73.3 kg 79.7 kg    Examination:  General exam: Frail.  Deconditioned. Respiratory system: CTA B anterior lung fields.  No wheezes, no crackles, no rhonchi.  Normal respiratory effort.  Cardiovascular system: Regular rate and rhythm no murmurs rubs or gallops.  No JVD.  No lower extremity edema.   Gastrointestinal system: Abdomen is nondistended, soft and nontender.  Ileostomy in place, midline incision. 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 Normal bowel sounds heard. Central nervous system: Alert and oriented. No focal neurological deficits. Extremities: Symmetric 5 x 5 power. Skin: No rashes, lesions or ulcers Psychiatry: Judgement and insight appear poor to fair. Mood & affect appropriate.     Data Reviewed: I have personally reviewed following labs and imaging studies  CBC: Recent Labs  Lab 01/18/22 0449 01/20/22 0428 01/23/22 0446  WBC 6.9 6.0 6.3  HGB 9.2* 8.5* 9.1*  HCT 31.4* 28.9* 30.9*  MCV 89.5 88.1 88.3  PLT 308 260 322     Basic Metabolic Panel: Recent Labs  Lab  01/18/22 0449 01/20/22 0428 01/21/22 0357 01/22/22 0434 01/23/22 0446  NA 139 140 140 141  --   K 3.7 3.4* 3.4* 3.5  --   CL 109 110 111 112*  --   CO2 26 24 26 24   --   GLUCOSE 109* 100* 105* 105*  --   BUN 14 8 5* 6*  --   CREATININE 0.37* <0.30* 0.31* <0.30*  --   CALCIUM 10.0 9.5 9.7 10.2  --   MG  --  1.6* 1.9 1.8 1.9     GFR: CrCl cannot be calculated (This lab value cannot be used to calculate CrCl because it is not a number: <0.30).  Liver Function Tests: Recent Labs  Lab 01/20/22 0428  AST 17  ALT 16  ALKPHOS 64  BILITOT 0.5  PROT 7.5  ALBUMIN 1.7*     CBG: No results for input(s): GLUCAP in the last 168 hours.   Recent Results (from the past 240 hour(s))  Urine Culture     Status: Abnormal   Collection Time: 01/16/22  5:44 PM   Specimen: Urine, Clean Catch  Result Value Ref Range Status  Specimen Description   Final    URINE, CLEAN CATCH Performed at [facility], [place] [address] [place], [place] [address]    Special Requests   Final    NONE Performed at [facility], [place] [address], [place], [place] [address]    Culture MULTIPLE SPECIES PRESENT, SUGGEST RECOLLECTION (A)  Final   Report Status 01/18/2022 FINAL  Final  Culture, blood (Routine X 2) w Reflex to ID Panel     Status: None   Collection Time: 01/17/22  6:40 AM   Specimen: BLOOD LEFT HAND  Result Value Ref Range Status   Specimen Description   Final    BLOOD LEFT HAND Performed at [place] [address] [place], [place] [address]    Special Requests   Final    BOTTLES DRAWN AEROBIC ONLY Blood Culture results may not be optimal due to an inadequate volume of blood received in culture bottles Performed at [place] [address], [place], [place] [address]    Culture   Final    NO GROWTH 5 DAYS Performed at [facility], [place] [address], [place], [place] [address]    Report Status  01/22/2022 FINAL  Final  Culture, blood (Routine X 2) w Reflex to ID Panel     Status: Abnormal   Collection Time: 01/17/22  7:06 AM   Specimen: BLOOD  Result Value Ref Range Status   Specimen Description   Final    BLOOD RIGHT WRIST Performed at [place] [address] [place], [place] [address]    Special Requests   Final    BOTTLES DRAWN AEROBIC ONLY Blood Culture results may not be optimal due to an inadequate volume of blood received in culture bottles Performed at [place] [address], [place], [place] [address]    Culture  Setup Time   Final    GRAM POSITIVE COCCI IN CLUSTERS AEROBIC BOTTLE ONLY CRITICAL RESULT CALLED TO, READ BACK BY AND VERIFIED WITH: J,GADHIA PHARMD @1050  01/18/22 EB    Culture (A)  Final    STAPHYLOCOCCUS EPIDERMIDIS THE SIGNIFICANCE OF ISOLATING THIS ORGANISM FROM A SINGLE SET OF BLOOD CULTURES WHEN MULTIPLE SETS ARE DRAWN IS UNCERTAIN. PLEASE NOTIFY THE MICROBIOLOGY DEPARTMENT WITHIN ONE WEEK IF SPECIATION AND SENSITIVITIES ARE REQUIRED. Performed at [facility], [place] [address], [place], [place] [address]    Report Status 01/20/2022 FINAL  Final  Blood Culture ID Panel (Reflexed)     Status: Abnormal   Collection Time: 01/17/22  7:06 AM  Result Value Ref Range Status   Enterococcus faecalis NOT DETECTED NOT DETECTED Final   Enterococcus Faecium NOT DETECTED NOT DETECTED Final   Listeria monocytogenes NOT DETECTED NOT DETECTED Final   Staphylococcus species DETECTED (A) NOT DETECTED Final    Comment: CRITICAL RESULT CALLED TO, READ BACK BY AND VERIFIED WITH: J,GADHIA PHARMD @1050  01/18/22 EB    Staphylococcus aureus (BCID) NOT DETECTED NOT DETECTED Final   Staphylococcus epidermidis DETECTED (A) NOT DETECTED Final    Comment: Methicillin (oxacillin) resistant coagulase negative staphylococcus. Possible blood culture contaminant (unless isolated from more than one blood culture draw or clinical  case suggests pathogenicity). No antibiotic treatment is indicated for blood  culture contaminants. CRITICAL RESULT CALLED TO, READ BACK BY AND VERIFIED WITH: J,GADHIA PHARMD @1050  01/18/22 EB    Staphylococcus lugdunensis NOT DETECTED NOT DETECTED Final   Streptococcus species NOT DETECTED NOT DETECTED Final  Streptococcus agalactiae NOT DETECTED NOT DETECTED Final   Streptococcus pneumoniae NOT DETECTED NOT DETECTED Final   Streptococcus pyogenes NOT DETECTED NOT DETECTED Final   A.calcoaceticus-baumannii NOT DETECTED NOT DETECTED Final   Bacteroides fragilis NOT DETECTED NOT DETECTED Final   Enterobacterales NOT DETECTED NOT DETECTED Final   Enterobacter cloacae complex NOT DETECTED NOT DETECTED Final   Escherichia coli NOT DETECTED NOT DETECTED Final   Klebsiella aerogenes NOT DETECTED NOT DETECTED Final   Klebsiella oxytoca NOT DETECTED NOT DETECTED Final   Klebsiella pneumoniae NOT DETECTED NOT DETECTED Final   Proteus species NOT DETECTED NOT DETECTED Final   Salmonella species NOT DETECTED NOT DETECTED Final   Serratia marcescens NOT DETECTED NOT DETECTED Final   Haemophilus influenzae NOT DETECTED NOT DETECTED Final   Neisseria meningitidis NOT DETECTED NOT DETECTED Final   Pseudomonas aeruginosa NOT DETECTED NOT DETECTED Final   Stenotrophomonas maltophilia NOT DETECTED NOT DETECTED Final   Candida albicans NOT DETECTED NOT DETECTED Final   Candida auris NOT DETECTED NOT DETECTED Final   Candida glabrata NOT DETECTED NOT DETECTED Final   Candida krusei NOT DETECTED NOT DETECTED Final   Candida parapsilosis NOT DETECTED NOT DETECTED Final   Candida tropicalis NOT DETECTED NOT DETECTED Final   Cryptococcus neoformans/gattii NOT DETECTED NOT DETECTED Final   Methicillin resistance mecA/C DETECTED (A) NOT DETECTED Final    Comment: CRITICAL RESULT CALLED TO, READ BACK BY AND VERIFIED WITH: J,GADHIA PHARMD @1050  01/18/22 EB Performed at [facility], [address],  [place], [place] [address]   Culture, blood (routine x 2)     Status: Abnormal   Collection Time: 01/19/22 11:27 AM   Specimen: BLOOD  Result Value Ref Range Status   Specimen Description   Final    BLOOD BLOOD RIGHT HAND Performed at [place] [address], [place], [place] [address]    Special Requests   Final    BOTTLES DRAWN AEROBIC ONLY Blood Culture adequate volume Performed at [place] [address], [place], [place] [address]    Culture  Setup Time   Final    GRAM POSITIVE COCCI IN CLUSTERS AEROBIC BOTTLE ONLY CRITICAL VALUE NOTED.  VALUE IS CONSISTENT WITH PREVIOUSLY REPORTED AND CALLED VALUE. Performed at [facility], [place] [address] [place], [place] [address]    Culture STAPHYLOCOCCUS EPIDERMIDIS (A)  Final   Report Status 01/22/2022 FINAL  Final   Organism ID, Bacteria STAPHYLOCOCCUS EPIDERMIDIS  Final      Susceptibility   Staphylococcus epidermidis - MIC*    CIPROFLOXACIN >=8 RESISTANT Resistant     ERYTHROMYCIN >=8 RESISTANT Resistant     GENTAMICIN <=0.5 SENSITIVE Sensitive     OXACILLIN >=4 RESISTANT Resistant     TETRACYCLINE 2 SENSITIVE Sensitive     VANCOMYCIN 1 SENSITIVE Sensitive     TRIMETH/SULFA 80 RESISTANT Resistant     CLINDAMYCIN >=8 RESISTANT Resistant     RIFAMPIN <=0.5 SENSITIVE Sensitive     Inducible Clindamycin NEGATIVE Sensitive     * STAPHYLOCOCCUS EPIDERMIDIS  Culture, blood (routine x 2)     Status: None (Preliminary result)   Collection Time: 01/19/22 11:33 AM   Specimen: BLOOD  Result Value Ref Range Status   Specimen Description   Final    BLOOD BLOOD LEFT HAND Performed at [place] [address], [place],  [address]    Special Requests   Final    BOTTLES DRAWN AEROBIC ONLY Blood Culture  results may not be optimal due to an inadequate volume of blood received in culture bottles Performed at [facility], [place] [address],  [place], [place] [address]    Culture   Final    NO GROWTH 4 DAYS Performed at [facility], [place] [address], [place], [place] [address]    Report Status PENDING  Incomplete  Urine Culture     Status: Abnormal   Collection Time: 01/20/22 12:41 PM   Specimen: In/Out Cath Urine  Result Value Ref Range Status   Specimen Description   Final    IN/OUT CATH URINE Performed at [place] [address], [place], [place] [address]    Special Requests   Final    NONE Performed at [facility], [place] [address], [place], [place] [address]    Culture MULTIPLE SPECIES PRESENT, SUGGEST RECOLLECTION (A)  Final   Report Status 01/21/2022 FINAL  Final          Radiology Studies: CT ABDOMEN PELVIS W CONTRAST  Result Date: 01/22/2022 CLINICAL DATA:  Postoperative abdominal pain, status post right colectomy secondary to perforated colon 11/17/2021 EXAM: CT ABDOMEN AND PELVIS WITH CONTRAST TECHNIQUE: Multidetector CT imaging of the abdomen and pelvis was performed using the standard protocol following bolus administration of intravenous contrast. RADIATION DOSE REDUCTION: This exam was performed according to the departmental dose-optimization program which includes automated exposure control, adjustment of the mA and/or kV according to patient size and/or use of iterative reconstruction technique. CONTRAST:  113mL OMNIPAQUE IOHEXOL 300 MG/ML  SOLN COMPARISON:  01/01/2022 FINDINGS: Lower chest: No acute abnormality. Coronary artery calcifications. Unchanged trace right pleural effusion associated atelectasis or consolidation. Hepatobiliary: No solid liver abnormality is seen. No gallstones, gallbladder wall thickening, or biliary dilatation. Pancreas: Unremarkable. No pancreatic ductal dilatation or surrounding inflammatory changes. Spleen: Normal in size without significant abnormality. Adrenals/Urinary Tract: Adrenal glands are unremarkable. Small nonobstructive  calculi in the inferior pole of the left kidney (series 4, image 52). Bladder is unremarkable. Stomach/Bowel: Stomach is within normal limits. Appendix appears normal. 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 Diverticula of the remaining distal ileum (series 2, image 62) and of the remaining descending and sigmoid colon. Chronic wall thickening of the sigmoid colon without evidence of acute diverticulitis. Vascular/Lymphatic: Scattered aortic atherosclerosis. Expansile, hypodense thrombus within the bilateral common femoral veins, with fat stranding about the vessels (series 2, image 87). No enlarged abdominal or pelvic lymph nodes. Reproductive: Calcified uterine fibroids. Other: No abdominal wall hernia or abnormality. No ascites. There is near complete resolution of previously seen heterogeneous fat stranding and fluid in the low right hemiabdomen, with some residual, tubular and bandlike elements in this vicinity (series 2, image 73). Musculoskeletal: No acute or significant osseous findings. IMPRESSION: 1. 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 2. Near complete resolution of previously seen heterogeneous fat stranding and fluid in the low right hemiabdomen, with some residual, tubular and bandlike elements in this vicinity. Findings are consistent with resolving postoperative hematoma/seroma with fat necrosis. There is no discrete fluid collection within the abdomen at this time. 3. Ileal and colonic diverticulosis without evidence of acute diverticulitis 4. Incidental note of expansile, hypodense thrombus within the bilateral common femoral veins, with fat stranding about the vessels. 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 5. Nonobstructive left nephrolithiasis. These results will be called to the  ordering clinician or representative  by the Radiologist Assistant, and communication documented in the PACS or [employer]. Aortic Atherosclerosis (ICD10-I70.0). Electronically Signed   By: Delanna Ahmadi M.D.   On: 01/22/2022 13:49        Scheduled Meds:  chlorhexidine  15 mL Mouth Rinse BID   feeding supplement  237 mL Oral TID BM   ferrous sulfate  325 mg Oral BID WC   folic acid  1 mg Oral Daily   mouth rinse  15 mL Mouth Rinse q12n4p   multivitamin with minerals  1 tablet Oral Daily   niacin  100 mg Oral QHS   nutrition supplement (JUVEN)  1 packet Oral BID BM   pantoprazole  40 mg Oral QHS   potassium chloride  20 mEq Oral BID   psyllium  1 packet Oral BID   vitamin B-6  100 mg Oral Daily   thiamine  100 mg Oral BID   Continuous Infusions:  dextrose 5 % and 0.9 % NaCl with KCl 20 mEq/L 50 mL/hr at 01/23/22 0614   heparin 1,400 Units/hr (01/23/22 0928)     LOS: 67 days    Time spent: 35 minutes    Irine Seal, MD [facility]   To contact the attending provider between 7A-7P or the covering provider during after hours 7P-7A, please log into the web site [email or web address] and access using universal [place] password for that web site. If you do not have the password, please call the hospital operator.  01/23/2022, 12:34 PM

## 2022-01-23 NOTE — Progress Notes (Signed)
Progress Note  67 Days Post-Op  Subjective: No acute change. No family at bedside.   Objective: Vital signs in last 24 hours: Temp:  [97.4 F (36.3 C)-100 F (37.8 C)] 97.4 F (36.3 C) (02/02 0606) Pulse Rate:  [94-116] 105 (02/02 0606) Resp:  [14-18] 16 (02/02 0606) BP: (124-143)/(79-95) 124/79 (02/02 0606) SpO2:  [99 %-100 %] 99 % (02/02 0606) Last BM Date: 01/23/22  Intake/Output from previous day: 02/01 0701 - 02/02 0700 In: 744.5 [I.V.:744.5] Out: 1520 [Urine:1350; Stool:170] Intake/Output this shift: Total I/O In: 0  Out: 110 [Urine:100; Stool:10]  PE: General: resting, NAD Heart: regular Resp: normal work of breathing Abdomen: soft, nondistended, nontender to palpation. Midline dressing is clean and intact. Ileostomy productive. 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  SCDs in place   Lab Results:  Recent Labs    01/23/22 0446  WBC 6.3  HGB 9.1*  HCT 30.9*  PLT 322   BMET Recent Labs    01/21/22 0357 01/22/22 0434  NA 140 141  K 3.4* 3.5  CL 111 112*  CO2 26 24  GLUCOSE 105* 105*  BUN 5* 6*  CREATININE 0.31* <0.30*  CALCIUM 9.7 10.2   PT/INR No results for input(s): LABPROT, INR in the last 72 hours. CMP     Component Value Date/Time   NA 141 01/22/2022 0434   K 3.5 01/22/2022 0434   CL 112 (H) 01/22/2022 0434   CO2 24 01/22/2022 0434   GLUCOSE 105 (H) 01/22/2022 0434   BUN 6 (L) 01/22/2022 0434   CREATININE <0.30 (L) 01/22/2022 0434   CALCIUM 10.2 01/22/2022 0434   CALCIUM 10.9 (H) 01/01/2022 0449   PROT 7.5 01/20/2022 0428   ALBUMIN 1.7 (L) 01/20/2022 0428   AST 17 01/20/2022 0428   ALT 16 01/20/2022 0428   ALKPHOS 64 01/20/2022 0428   BILITOT 0.5 01/20/2022 0428   GFRNONAA NOT CALCULATED 01/22/2022 0434   Lipase     Component Value Date/Time   LIPASE 22 11/17/2021 0955       Studies/Results: CT ABDOMEN PELVIS W CONTRAST  Result Date: 01/22/2022 CLINICAL  DATA:  Postoperative abdominal pain, status post right colectomy secondary to perforated colon 11/17/2021 EXAM: CT ABDOMEN AND PELVIS WITH CONTRAST TECHNIQUE: Multidetector CT imaging of the abdomen and pelvis was performed using the standard protocol following bolus administration of intravenous contrast. RADIATION DOSE REDUCTION: This exam was performed according to the departmental dose-optimization program which includes automated exposure control, adjustment of the mA and/or kV according to patient size and/or use of iterative reconstruction technique. CONTRAST:  182mL OMNIPAQUE IOHEXOL 300 MG/ML  SOLN COMPARISON:  01/01/2022 FINDINGS: Lower chest: No acute abnormality. Coronary artery calcifications. Unchanged trace right pleural effusion associated atelectasis or consolidation. Hepatobiliary: No solid liver abnormality is seen. No gallstones, gallbladder wall thickening, or biliary dilatation. Pancreas: Unremarkable. No pancreatic ductal dilatation or surrounding inflammatory changes. Spleen: Normal in size without significant abnormality. Adrenals/Urinary Tract: Adrenal glands are unremarkable. Small nonobstructive calculi in the inferior pole of the left kidney (series 4, image 52). Bladder is unremarkable. Stomach/Bowel: Stomach is within normal limits. Appendix appears normal. 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 Diverticula of the remaining distal ileum (series 2, image 62) and of the remaining descending and sigmoid colon. Chronic wall thickening of the sigmoid colon without evidence of acute diverticulitis. Vascular/Lymphatic: Scattered aortic atherosclerosis. Expansile, hypodense thrombus within the bilateral  common femoral veins, with fat stranding about the vessels (series 2, image 87). No enlarged abdominal or pelvic lymph nodes. Reproductive: Calcified uterine fibroids. Other: No abdominal wall hernia or abnormality.  No ascites. There is near complete resolution of previously seen heterogeneous fat stranding and fluid in the low right hemiabdomen, with some residual, tubular and bandlike elements in this vicinity (series 2, image 73). Musculoskeletal: No acute or significant osseous findings. IMPRESSION: 1. 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 2. Near complete resolution of previously seen heterogeneous fat stranding and fluid in the low right hemiabdomen, with some residual, tubular and bandlike elements in this vicinity. Findings are consistent with resolving postoperative hematoma/seroma with fat necrosis. There is no discrete fluid collection within the abdomen at this time. 3. Ileal and colonic diverticulosis without evidence of acute diverticulitis 4. Incidental note of expansile, hypodense thrombus within the bilateral common femoral veins, with fat stranding about the vessels. 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 5. Nonobstructive left nephrolithiasis. These results will be called to the ordering clinician or representative by the Radiologist Assistant, and communication documented in the PACS or [employer]. Aortic Atherosclerosis (ICD10-I70.0). Electronically Signed   By: Delanna Ahmadi M.D.   On: 01/22/2022 13:49    Anti-infectives: Anti-infectives (From admission, onward)    Start     Dose/Rate Route Frequency Ordered Stop   01/01/22 2100  piperacillin-tazobactam (ZOSYN) IVPB 3.375 g  Status:  Discontinued        3.375 g 12.5 mL/hr over 240 Minutes Intravenous Every 8 hours 01/01/22 2042 01/10/22 1333   11/25/21 1000  piperacillin-tazobactam (ZOSYN) IVPB 3.375 g  Status:  Discontinued        3.375 g 12.5 mL/hr over 240 Minutes Intravenous Every 8 hours 11/25/21 0909 12/01/21 0822   11/17/21 1830  piperacillin-tazobactam (ZOSYN) IVPB 3.375 g        3.375 g 12.5 mL/hr over 240 Minutes Intravenous  Every 8 hours 11/17/21 1415 11/22/21 2359   11/17/21 1230  piperacillin-tazobactam (ZOSYN) IVPB 3.375 g        3.375 g 100 mL/hr over 30 Minutes Intravenous  Once 11/17/21 1220 11/17/21 1304        Assessment/Plan 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 Harlow Asa 11/27 - path: necrotic colon at site of perforation, no malignancy or dysplasia noted  - Ileostomy output improved. Continue BID metamucil.  - WOC following for ileostomy, mucous fistula - Wound improved with healthy granulation tissue present, no tracts or purulent drainage present on dressing change this AM. Daily WTD dressing  - Pathology benign. CT distal descending/proximal sigmoid bowel luminal collapse. - CT 1/11 - some extravasated oral contrast in the subcutaneous abdominal wall superior and lateral to the ostomy at this level. There is inflammatory tissue at the level of the ostomy extending superiorly abutting the inferior tip of the liver and fundus of the gallbladder similar to the prior examination. There is likely some early fluid collection development anterior to the right kidney image 7/31 measuring 1.9 by 1.4 by 3.7 cm. Previously described fluid collections near the ostomy are not as well defined on the current study. 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 There is no free intraperitoneal air or ascites. Small air-fluid collection at the level of the umbilicus is again  seen containing air. This collection measures 2.5 x 0.7 by 1.3 cm and has mildly increased in size. Air in the collection is new from prior. - no collection around the ileostomy that is drainable - the best way to try to resolve this is abx and improving nutrition - zosyn completed for last CT scan findings and UTI on 1/20 - Dr. Hassell Done stopped her TNA on 1/17 - at  this time, the patient has no medical need for TNA as she has a functional GI tract.  Parenteral nutrition carries many long-term risks  and enteral nutrition is superior; therefore, oral intake or intake via other means such as g-tube, would be needed henceforth.    -palliative saw the patient and it sounds like she is good with a feeding tube and thinks that is what her sister would want  - repeat CT 2/1: resolution in RLQ stranding and resolving hematoma/seroma, bilateral common femoral DVTs - IR gastrostomy placement discussed with Dr. Serafina Royals this AM who has reservations placing G-tube in patient without family consent. He recommended placement of Dobhoff tube again today and monitor for improvement with increased enteral nutrition. Will discuss with MD today further  - From Ethics note 1/31: I think that at this point proceeding with enteral nutrition is in the best interest of Mrs. Hagans. This will provide appropriate nutrition and it is aligned with her goals for her care as expressed by her sister and previously documented.At this point after thorough review of risks versus benefit, if there is a continuous delay in the patient's family signing the consent for PEG tube insertion and it is affecting patient's care, it is reasonable to proceed with IR PEG tube placement without a formal signed consent. - fever curve improved, urine cx with multiple species on repeat cx as well, blood cx with staph 1/2 thought to be contaminate and repeat blood cxs sent 1/29 with the same  Bilateral femoral DVTs - noted on repeat CT AP yesterday, started on heparin gtt    FEN - Dysphagia 2 diet/but won't eat   VTE - SCDs, ID - zosyn 11/27>12/2, 12/5>12/11, restarted 1/11> 1/20 Foley - None currently    - encephalopathy/FTT -  On folic acid and thiamine supplementation. Appreciate TRH following as well.   - psych saw 12/27 and determined patient does not have capacity   - frequent urination - Urine Cx 1/10  with enterococcus faecalis sensitive to ampicillin, Zosyn completed. Urine Cx ordered 1/26 with multiple species, reordered 1/30.   Dispo: unclear at this time  LOS: 67 days   I reviewed hospitalist notes, last 24 h vitals and pain scores, last 48 h intake and output, last 24 h labs and trends, and last 24 h imaging results.  This care required moderate level of medical decision making.    Norm Parcel, [facility] Surgery 01/23/2022, 11:15 AM Please see Amion for pager number during day hours 7:00am-4:30pm

## 2022-01-23 NOTE — Anesthesia Preprocedure Evaluation (Addendum)
Anesthesia Evaluation  Patient identified by MRN, date of birth, ID band Patient awake    Reviewed: Allergy & Precautions, NPO status , Patient's Chart, lab work & pertinent test results  Airway Mallampati: II  TM Distance: >3 FB Neck ROM: Full    Dental  (+) Poor Dentition, Dental Advisory Given, Chipped, Missing   Pulmonary neg pulmonary ROS, former smoker,    Pulmonary exam normal breath sounds clear to auscultation       Cardiovascular negative cardio ROS Normal cardiovascular exam Rhythm:Regular Rate:Normal     Neuro/Psych PSYCHIATRIC DISORDERS    GI/Hepatic Neg liver ROS, GERD  ,History noted Dr. Nyoka Cowden   Endo/Other  negative endocrine ROS  Renal/GU negative Renal ROS     Musculoskeletal  (+) Arthritis ,   Abdominal   Peds  Hematology  (+) Blood dyscrasia, anemia ,   Anesthesia Other Findings   Reproductive/Obstetrics                           Anesthesia Physical  Anesthesia Plan  ASA: 3  Anesthesia Plan: MAC   Post-op Pain Management: Minimal or no pain anticipated   Induction: Intravenous  PONV Risk Score and Plan: 3 and Ondansetron, Dexamethasone, Midazolam and Treatment may vary due to age or medical condition  Airway Management Planned: Natural Airway  Additional Equipment:   Intra-op Plan:   Post-operative Plan:   Informed Consent: I have reviewed the patients History and Physical, chart, labs and discussed the procedure including the risks, benefits and alternatives for the proposed anesthesia with the patient or authorized representative who has indicated his/her understanding and acceptance.     Dental advisory given and Consent reviewed with POA  Plan Discussed with: CRNA  Anesthesia Plan Comments: (Discussed anesthesia with sister [name]. )       Anesthesia Quick Evaluation

## 2022-01-23 NOTE — Progress Notes (Signed)
Received a message from RN at 3:23 PM that [place] called and wanted to speak with surgical team right away and would be agreeable to consent to PEG placement. I was in the OR until 4:20 PM but called the patient at [phone], as requested, at 4:30 PM with RN next to me to witness. No answer. I advised RN that if she calls back again to proceed with consent if she is agreeable and let her know that we would be happy to call in the morning to answer any remaining questions regarding PEG placement.   Norm Parcel, [facility] 01/23/2022, 4:50 PM Please see Amion for pager number during day hours 7:00am-4:30pm

## 2022-01-23 NOTE — Progress Notes (Signed)
ANTICOAGULATION CONSULT NOTE  Pharmacy Consult for Heparin Indication: Bilateral femoral DVTs  No Known Allergies  Patient Measurements: Height: 5\' 10"  (177.8 cm) Weight: 79.7 kg (175 lb 11.3 oz) IBW/kg (Calculated) : 68.5 Heparin Dosing Weight: total body weight  Vital Signs: Temp: 97.7 F (36.5 C) (02/02 1434) Temp Source: Oral (02/02 1434) BP: 126/74 (02/02 1434) Pulse Rate: 103 (02/02 1434)  Labs: Recent Labs    01/21/22 0357 01/22/22 0434 01/22/22 2157 01/23/22 0446 01/23/22 0707 01/23/22 1726  HGB  --   --   --  9.1*  --   --   HCT  --   --   --  30.9*  --   --   PLT  --   --   --  322  --   --   HEPARINUNFRC  --   --  0.27*  --  0.30 0.25*  CREATININE 0.31* <0.30*  --   --   --   --      CrCl cannot be calculated (This lab value cannot be used to calculate CrCl because it is not a number: <0.30).   Medical History: Past Medical History:  Diagnosis Date   Anemia    past hx    Arthritis    wrists , knees,  feet    Cataract    cordical cataracts per pt    Constipation    takes a stool softener  daily- as long as does this has daily soft stools    GERD (gastroesophageal reflux disease)    Hemorrhoids    Hyperlipidemia     Assessment: 66 yo female admitted 11/17/21 with 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 On 01/22/22, found to have bilateral femoral DVTs on CT scan.  Pharmacy consulted to dose IV heparin; has been receiving lovenox 40mg  q24h - last dose 01/22/22 at 0720.   01/23/22:  0707 heparin level = 0.30 units/mL (therapeutic, but at low end of goal) on heparin infusion @ 1400 units/hr Per discussion with RN, heparin infusion was stopped ~0729 this AM for PEG tube placement today w/ IR Heparin infusion resumed at 0928 at patient will not go for PEG tube placement today per [facility] Plan is to proceed with PEG tube placement 2/3. Heparin to be held 2/3 at 0300 per [facility].  PM heparin level = 0.25 units/mL, slightly subtherapeutic No  interruptions in therapy or IV site issues per RN No bleeding issues per nursing   Goal of Therapy:  Heparin level 0.3-0.7 units/ml Monitor platelets by anticoagulation protocol: Yes   Plan:  Increase heparin infusion to 1550 units/hr Heparin level 6 hours after rate change Daily CBC Monitor for s/sx of bleeding Heparin infusion to stop on 01/24/22 at 0300 per [facility] (stop time entered) F/u ability to switch to oral anticoagulation after procedure   Lindell Spar, PharmD, BCPS Clinical Pharmacist  01/23/2022 6:47 PM

## 2022-01-23 NOTE — Progress Notes (Signed)
Attempted to contact both numbers I have listed for Sherry Hampton to provide clinical update on [employer]. No answer and no ability to leave a voicemail on the [phone] number. I was able to reach Sherry Hampton's granddaughter on the [phone] number and she reports that Sherry Hampton does not have another available number at the moment and she will not be with her again until tomorrow. I asked her to please let Sherry Hampton know that I called. I attempted to call Sherry Hampton and was sent directly to voicemail, I did not feel it was appropriate to leave a voicemail with patient information and medical information. Will attempt to contact either Sherry Hampton and/or Sherry Hampton again tomorrow.   Sherry Hampton, [facility] Surgery 01/23/2022, 11:49 AM Please see Amion for pager number during day hours 7:00am-4:30pm

## 2022-01-24 ENCOUNTER — Encounter (HOSPITAL_COMMUNITY): Admission: EM | Disposition: A | Discharge status: Skilled Nursing Facility | Payer: Self-pay | Source: Home / Self Care

## 2022-01-24 ENCOUNTER — Inpatient Hospital Stay (HOSPITAL_COMMUNITY): Payer: Medicare HMO | Admitting: Anesthesiology

## 2022-01-24 ENCOUNTER — Encounter (HOSPITAL_COMMUNITY): Payer: Self-pay | Admitting: Internal Medicine

## 2022-01-24 HISTORY — PX: ESOPHAGOGASTRODUODENOSCOPY: SHX5428

## 2022-01-24 HISTORY — PX: PEG PLACEMENT: SHX5437

## 2022-01-24 LAB — RENAL FUNCTION PANEL
Albumin: 1.7 g/dL — ABNORMAL LOW (ref 3.5–5.0)
Anion gap: 4 — ABNORMAL LOW (ref 5–15)
BUN: 7 mg/dL — ABNORMAL LOW (ref 8–23)
CO2: 25 mmol/L (ref 22–32)
Calcium: 9.8 mg/dL (ref 8.9–10.3)
Chloride: 112 mmol/L — ABNORMAL HIGH (ref 98–111)
Creatinine, Ser: 0.34 mg/dL — ABNORMAL LOW (ref 0.44–1.00)
GFR, Estimated: >60 mL/min (ref >60)
Glucose, Bld: 106 mg/dL — ABNORMAL HIGH (ref 70–99)
Phosphorus: 2.6 mg/dL (ref 2.5–4.6)
Potassium: 3.2 mmol/L — ABNORMAL LOW (ref 3.5–5.1)
Sodium: 141 mmol/L (ref 135–145)

## 2022-01-24 LAB — CBC
HCT: 28 % — ABNORMAL LOW (ref 36.0–46.0)
Hemoglobin: 8 g/dL — ABNORMAL LOW (ref 12.0–15.0)
MCH: 25.5 pg — ABNORMAL LOW (ref 26.0–34.0)
MCHC: 28.6 g/dL — ABNORMAL LOW (ref 30.0–36.0)
MCV: 89.2 fL (ref 80.0–100.0)
Platelets: 283 10*3/uL (ref 150–400)
RBC: 3.14 MIL/uL — ABNORMAL LOW (ref 3.87–5.11)
RDW: 18.3 % — ABNORMAL HIGH (ref 11.5–15.5)
WBC: 5.5 10*3/uL (ref 4.0–10.5)
nRBC: 0 % (ref 0.0–0.2)

## 2022-01-24 LAB — HEPARIN LEVEL (UNFRACTIONATED)
Heparin Unfractionated: 0.43 IU/mL (ref 0.30–0.70)
Heparin Unfractionated: <0.1 IU/mL — ABNORMAL LOW (ref 0.30–0.70)
Heparin Unfractionated: 0.25 IU/mL — ABNORMAL LOW (ref 0.30–0.70)

## 2022-01-24 LAB — HEMOGLOBIN A1C
Hgb A1c MFr Bld: 4.6 % — ABNORMAL LOW (ref 4.8–5.6)
Mean Plasma Glucose: 85.32 mg/dL

## 2022-01-24 LAB — CULTURE, BLOOD (ROUTINE X 2): Culture: NO GROWTH

## 2022-01-24 LAB — GLUCOSE, CAPILLARY
Glucose-Capillary: 96 mg/dL (ref 70–99)
Glucose-Capillary: 104 mg/dL — ABNORMAL HIGH (ref 70–99)
Glucose-Capillary: 107 mg/dL — ABNORMAL HIGH (ref 70–99)

## 2022-01-24 LAB — MAGNESIUM: Magnesium: 1.8 mg/dL (ref 1.7–2.4)

## 2022-01-24 SURGERY — EGD (ESOPHAGOGASTRODUODENOSCOPY)
Anesthesia: Monitor Anesthesia Care

## 2022-01-24 MED ORDER — THIAMINE HCL 100 MG PO TABS
100.0000 mg | ORAL_TABLET | Freq: Two times a day (BID) | ORAL | Status: DC
  Administered 2022-01-24 – 2022-02-05 (×24): 100 mg
  Filled 2022-01-24 (×24): qty 1

## 2022-01-24 MED ORDER — PROPOFOL 500 MG/50ML IV EMUL
INTRAVENOUS | Status: AC
  Filled 2022-01-24: qty 50

## 2022-01-24 MED ORDER — HEPARIN (PORCINE) 25000 UT/250ML-% IV SOLN
1800.0000 [IU]/h | INTRAVENOUS | Status: DC
  Administered 2022-01-25: 1650 [IU]/h via INTRAVENOUS
  Administered 2022-01-25: 2000 [IU]/h via INTRAVENOUS
  Administered 2022-01-26: 1800 [IU]/h via INTRAVENOUS
  Filled 2022-01-24 (×3): qty 250

## 2022-01-24 MED ORDER — NIACIN 100 MG PO TABS
100.0000 mg | ORAL_TABLET | Freq: Every day | ORAL | Status: DC
  Administered 2022-01-24 – 2022-02-04 (×12): 100 mg
  Filled 2022-01-24 (×13): qty 1

## 2022-01-24 MED ORDER — POTASSIUM CHLORIDE 20 MEQ PO PACK
20.0000 meq | PACK | Freq: Two times a day (BID) | ORAL | Status: DC
  Administered 2022-01-24 – 2022-01-25 (×4): 20 meq
  Filled 2022-01-24 (×5): qty 1

## 2022-01-24 MED ORDER — OSMOLITE 1.2 CAL PO LIQD
1000.0000 mL | ORAL | Status: DC
  Filled 2022-01-24: qty 1000

## 2022-01-24 MED ORDER — IBUPROFEN 100 MG/5ML PO SUSP
400.0000 mg | Freq: Four times a day (QID) | ORAL | Status: DC | PRN
  Filled 2022-01-24: qty 20

## 2022-01-24 MED ORDER — PROPOFOL 10 MG/ML IV BOLUS
INTRAVENOUS | Status: DC | PRN
  Administered 2022-01-24 (×2): 20 mg via INTRAVENOUS

## 2022-01-24 MED ORDER — LACTATED RINGERS IV SOLN
INTRAVENOUS | Status: AC | PRN
  Administered 2022-01-24: 1000 mL via INTRAVENOUS

## 2022-01-24 MED ORDER — MAGNESIUM SULFATE 2 GM/50ML IV SOLN
2.0000 g | Freq: Once | INTRAVENOUS | Status: AC
  Administered 2022-01-24: 2 g via INTRAVENOUS
  Filled 2022-01-24: qty 50

## 2022-01-24 MED ORDER — VITAMIN B-6 100 MG PO TABS
100.0000 mg | ORAL_TABLET | Freq: Every day | ORAL | Status: DC
  Administered 2022-01-24 – 2022-02-05 (×12): 100 mg
  Filled 2022-01-24 (×12): qty 1

## 2022-01-24 MED ORDER — FOLIC ACID 1 MG PO TABS
1.0000 mg | ORAL_TABLET | Freq: Every day | ORAL | Status: DC
  Administered 2022-01-24 – 2022-02-05 (×11): 1 mg
  Filled 2022-01-24 (×12): qty 1

## 2022-01-24 MED ORDER — IBUPROFEN 100 MG/5ML PO SUSP
100.0000 mg | Freq: Once | ORAL | Status: DC | PRN
  Filled 2022-01-24: qty 5

## 2022-01-24 MED ORDER — PROPOFOL 500 MG/50ML IV EMUL
INTRAVENOUS | Status: DC | PRN
  Administered 2022-01-24: 125 ug/kg/min via INTRAVENOUS

## 2022-01-24 MED ORDER — OXYCODONE HCL 5 MG/5ML PO SOLN
2.5000 mg | ORAL | Status: DC | PRN
  Administered 2022-01-24 – 2022-02-02 (×4): 2.5 mg
  Filled 2022-01-24 (×5): qty 5

## 2022-01-24 MED ORDER — ONDANSETRON HCL 4 MG/2ML IJ SOLN
INTRAMUSCULAR | Status: DC | PRN
  Administered 2022-01-24: 4 mg via INTRAVENOUS

## 2022-01-24 MED ORDER — INSULIN ASPART 100 UNIT/ML IJ SOLN
0.0000 [IU] | INTRAMUSCULAR | Status: DC
  Administered 2022-01-25 – 2022-02-05 (×45): 1 [IU] via SUBCUTANEOUS

## 2022-01-24 MED ORDER — FERROUS SULFATE 300 (60 FE) MG/5ML PO SYRP
220.0000 mg | ORAL_SOLUTION | Freq: Three times a day (TID) | ORAL | Status: DC
  Administered 2022-01-24 (×2): 220 mg
  Filled 2022-01-24 (×4): qty 5

## 2022-01-24 MED ORDER — HEPARIN (PORCINE) 25000 UT/250ML-% IV SOLN
1550.0000 [IU]/h | INTRAVENOUS | Status: DC
  Administered 2022-01-24: 1550 [IU]/h via INTRAVENOUS
  Filled 2022-01-24: qty 250

## 2022-01-24 MED ORDER — PANTOPRAZOLE 2 MG/ML SUSPENSION
40.0000 mg | Freq: Every day | ORAL | Status: DC
  Administered 2022-01-25 – 2022-02-05 (×10): 40 mg
  Filled 2022-01-24 (×13): qty 20

## 2022-01-24 MED ORDER — OSMOLITE 1.5 CAL PO LIQD
1000.0000 mL | ORAL | Status: DC
  Administered 2022-01-24: 1000 mL
  Filled 2022-01-24 (×2): qty 1000

## 2022-01-24 MED ORDER — ADULT MULTIVITAMIN W/MINERALS CH
1.0000 | ORAL_TABLET | Freq: Every day | ORAL | Status: DC
  Administered 2022-01-24 – 2022-01-28 (×5): 1
  Filled 2022-01-24 (×5): qty 1

## 2022-01-24 NOTE — Progress Notes (Addendum)
Progress Note  Day of Surgery  Subjective: Pt had PEG placed this AM, it went very well. Checked on her 2 hrs later and she was doing well and denied pain. I called and updated patient's sister [name] by phone and she was very appreciative.   Objective: Vital signs in last 24 hours: Temp:  [97.3 F (36.3 C)-98.3 F (36.8 C)] 97.7 F (36.5 C) (02/03 0800) Pulse Rate:  [82-103] 83 (02/03 0830) Resp:  [15-22] 17 (02/03 0830) BP: (119-138)/(64-80) 128/64 (02/03 0830) SpO2:  [97 %-100 %] 97 % (02/03 0830) Weight:  [79.7 kg] 79.7 kg (02/03 0653) Last BM Date: 01/23/22  Intake/Output from previous day: 02/02 0701 - 02/03 0700 In: 1579.2 [P.O.:140; I.V.:1439.2] Out: 920 [Urine:850; Stool:70] Intake/Output this shift: Total I/O In: 543.4 [I.V.:543.4] Out: 50 [Urine:50]  PE: General: resting, NAD Heart: regular Resp: normal work of breathing Abdomen: soft, nondistended, nontender to palpation. Midline dressing is clean and intact. Ileostomy productive. 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 PEG in LUQ, clamped currently  Ext: some LE edema, but palpable pedal pulses bilaterally.  SCDs in place   Lab Results:  Recent Labs    01/23/22 0446 01/24/22 0054  WBC 6.3 5.5  HGB 9.1* 8.0*  HCT 30.9* 28.0*  PLT 322 283   BMET Recent Labs    01/22/22 0434 01/24/22 0054  NA 141 141  K 3.5 3.2*  CL 112* 112*  CO2 24 25  GLUCOSE 105* 106*  BUN 6* 7*  CREATININE <0.30* 0.34*  CALCIUM 10.2 9.8   PT/INR No results for input(s): LABPROT, INR in the last 72 hours. CMP     Component Value Date/Time   NA 141 01/24/2022 0054   K 3.2 (L) 01/24/2022 0054   CL 112 (H) 01/24/2022 0054   CO2 25 01/24/2022 0054   GLUCOSE 106 (H) 01/24/2022 0054   BUN 7 (L) 01/24/2022 0054   CREATININE 0.34 (L) 01/24/2022 0054   CALCIUM 9.8 01/24/2022 0054   CALCIUM 10.9 (H) 01/01/2022 0449   PROT 7.5 01/20/2022 0428   ALBUMIN 1.7 (L) 01/24/2022 0054   AST 17 01/20/2022 0428   ALT 16  01/20/2022 0428   ALKPHOS 64 01/20/2022 0428   BILITOT 0.5 01/20/2022 0428   GFRNONAA >60 01/24/2022 0054   Lipase     Component Value Date/Time   LIPASE 22 11/17/2021 0955       Studies/Results: CT ABDOMEN PELVIS W CONTRAST  Result Date: 01/22/2022 CLINICAL DATA:  Postoperative abdominal pain, status post right colectomy secondary to perforated colon 11/17/2021 EXAM: CT ABDOMEN AND PELVIS WITH CONTRAST TECHNIQUE: Multidetector CT imaging of the abdomen and pelvis was performed using the standard protocol following bolus administration of intravenous contrast. RADIATION DOSE REDUCTION: This exam was performed according to the departmental dose-optimization program which includes automated exposure control, adjustment of the mA and/or kV according to patient size and/or use of iterative reconstruction technique. CONTRAST:  134mL OMNIPAQUE IOHEXOL 300 MG/ML  SOLN COMPARISON:  01/01/2022 FINDINGS: Lower chest: No acute abnormality. Coronary artery calcifications. Unchanged trace right pleural effusion associated atelectasis or consolidation. Hepatobiliary: No solid liver abnormality is seen. No gallstones, gallbladder wall thickening, or biliary dilatation. Pancreas: Unremarkable. No pancreatic ductal dilatation or surrounding inflammatory changes. Spleen: Normal in size without significant abnormality. Adrenals/Urinary Tract: Adrenal glands are unremarkable. Small nonobstructive calculi in the inferior pole of the left kidney (series 4, image 52). Bladder is unremarkable. Stomach/Bowel: Stomach is within normal limits. Appendix appears normal. Status post right colectomy with  right lower quadrant end ileostomy and left hemiabdominal transverse mucous fistula. Diverticula of the remaining distal ileum (series 2, image 62) and of the remaining descending and sigmoid colon. Chronic wall thickening of the sigmoid colon without evidence of acute diverticulitis. Vascular/Lymphatic: Scattered aortic  atherosclerosis. Expansile, hypodense thrombus within the bilateral common femoral veins, with fat stranding about the vessels (series 2, image 87). No enlarged abdominal or pelvic lymph nodes. Reproductive: Calcified uterine fibroids. Other: No abdominal wall hernia or abnormality. No ascites. There is near complete resolution of previously seen heterogeneous fat stranding and fluid in the low right hemiabdomen, with some residual, tubular and bandlike elements in this vicinity (series 2, image 73). Musculoskeletal: No acute or significant osseous findings. IMPRESSION: 1. 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 2. Near complete resolution of previously seen heterogeneous fat stranding and fluid in the low right hemiabdomen, with some residual, tubular and bandlike elements in this vicinity. Findings are consistent with resolving postoperative hematoma/seroma with fat necrosis. There is no discrete fluid collection within the abdomen at this time. 3. Ileal and colonic diverticulosis without evidence of acute diverticulitis 4. Incidental note of expansile, hypodense thrombus within the bilateral common femoral veins, with fat stranding about the vessels. 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 5. Nonobstructive left nephrolithiasis. These results will be called to the ordering clinician or representative by the Radiologist Assistant, and communication documented in the PACS or [employer]. Aortic Atherosclerosis (ICD10-I70.0). Electronically Signed   By: Delanna Ahmadi M.D.   On: 01/22/2022 13:49    Anti-infectives: Anti-infectives (From admission, onward)    Start     Dose/Rate Route Frequency Ordered Stop   01/01/22 2100  piperacillin-tazobactam (ZOSYN) IVPB 3.375 g  Status:  Discontinued        3.375 g 12.5 mL/hr over 240 Minutes Intravenous Every 8 hours 01/01/22 2042 01/10/22 1333   11/25/21 1000   piperacillin-tazobactam (ZOSYN) IVPB 3.375 g  Status:  Discontinued        3.375 g 12.5 mL/hr over 240 Minutes Intravenous Every 8 hours 11/25/21 0909 12/01/21 0822   11/17/21 1830  piperacillin-tazobactam (ZOSYN) IVPB 3.375 g        3.375 g 12.5 mL/hr over 240 Minutes Intravenous Every 8 hours 11/17/21 1415 11/22/21 2359   11/17/21 1230  piperacillin-tazobactam (ZOSYN) IVPB 3.375 g        3.375 g 100 mL/hr over 30 Minutes Intravenous  Once 11/17/21 1220 11/17/21 1304        Assessment/Plan 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 Harlow Asa 11/27 - path: necrotic colon at site of perforation, no malignancy or dysplasia noted  - Ileostomy output improved. Continue BID metamucil.  - WOC following for ileostomy, mucous fistula - Wound improved with healthy granulation tissue present, no tracts or purulent drainage present on dressing change this AM. Daily WTD dressing  - Pathology benign. CT distal descending/proximal sigmoid bowel luminal collapse. - CT 1/11 - some extravasated oral contrast in the subcutaneous abdominal wall superior and lateral to the ostomy at this level. There is inflammatory tissue at the level of the ostomy extending superiorly abutting the inferior tip of the liver and fundus of the gallbladder similar to the prior examination. There is likely some early fluid collection development anterior to the right kidney image 7/31 measuring 1.9 by 1.4 by 3.7 cm. Previously described fluid collections near the  ostomy are not as well defined on the current study. 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 There is no free intraperitoneal air or ascites. Small air-fluid collection at the level of the umbilicus is again seen containing air. This collection measures 2.5 x 0.7 by 1.3 cm and has mildly  increased in size. Air in the collection is new from prior. - no collection around the ileostomy that is drainable - the best way to try to resolve this is abx and improving nutrition - zosyn completed for last CT scan findings and UTI on 1/20 - Dr. Hassell Done stopped her TNA on 1/17 - at this time, the patient has no medical need for TNA as she has a functional GI tract.  Parenteral nutrition carries many long-term risks  and enteral nutrition is superior; therefore, oral intake or intake via other means such as g-tube, would be needed henceforth.    -palliative saw the patient and it sounds like she is good with a feeding tube and thinks that is what her sister would want  - repeat CT 2/1: resolution in RLQ stranding and resolving hematoma/seroma, bilateral common femoral DVTs - Family consented to PEG placement late 2/2, PEG was placed this AM 2/3. Ok to start TF 4 hrs after procedure, ok to use for meds immediately  - fever curve improved, urine cx with multiple species on repeat cx as well, blood cx with staph 1/2 thought to be contaminate and repeat blood cxs sent 1/29 with the same   Bilateral femoral DVTs - noted on repeat CT AP 2/1, on heparin gtt, will transition to DOAC once up to goal on TF    FEN - Dysphagia 2 diet/but won't eat; PEG placed today and ok to start trickle TF at 1200 today    VTE - SCDs, ID - zosyn 11/27>12/2, 12/5>12/11, restarted 1/11> 1/20 Foley - None currently    - encephalopathy/FTT -  On folic acid and thiamine supplementation. Appreciate TRH following as well.   - psych saw 12/27 and determined patient does not have capacity   - frequent urination - Urine Cx 1/10 with enterococcus faecalis sensitive to ampicillin, Zosyn completed. Urine Cx ordered 1/26 with multiple species, reordered 1/30.   Dispo: unclear at this time  LOS: 68 days   I reviewed last 24 h vitals and pain scores, last 48 h intake and output, and last 24 h labs and trends.  This care  required moderate level of medical decision making.    Norm Parcel, [facility] Surgery 01/24/2022, 10:33 AM Please see Amion for pager number during day hours 7:00am-4:30pm

## 2022-01-24 NOTE — Progress Notes (Signed)
ANTICOAGULATION CONSULT NOTE  Pharmacy Consult for Heparin Indication: Bilateral femoral DVTs  No Known Allergies  Patient Measurements: Height: 5\' 10"  (177.8 cm) Weight: 79.7 kg (175 lb 11.3 oz) IBW/kg (Calculated) : 68.5 Heparin Dosing Weight: total body weight  Vital Signs: Temp: 98.3 F (36.8 C) (02/02 2025) Temp Source: Oral (02/02 2025) BP: 122/73 (02/02 2025) Pulse Rate: 89 (02/02 2025)  Labs: Recent Labs    01/21/22 0357 01/22/22 0434 01/22/22 2157 01/23/22 0446 01/23/22 0707 01/23/22 1726 01/24/22 0054  HGB  --   --   --  9.1*  --   --  8.0*  HCT  --   --   --  30.9*  --   --  28.0*  PLT  --   --   --  322  --   --  283  HEPARINUNFRC  --   --    < >  --  0.30 0.25* 0.43  CREATININE 0.31* <0.30*  --   --   --   --  0.34*   < > = values in this interval not displayed.     Estimated Creatinine Clearance: 73.8 mL/min (A) (by C-G formula based on SCr of 0.34 mg/dL (L)).   Medical History: Past Medical History:  Diagnosis Date   Anemia    past hx    Arthritis    wrists , knees,  feet    Cataract    cordical cataracts per pt    Constipation    takes a stool softener  daily- as long as does this has daily soft stools    GERD (gastroesophageal reflux disease)    Hemorrhoids    Hyperlipidemia     Assessment: 66 yo female admitted 11/17/21 with 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 On 01/22/22, found to have bilateral femoral DVTs on CT scan.  Pharmacy consulted to dose IV heparin; has been receiving lovenox 40mg  q24h - last dose 01/22/22 at 0720.   01/24/22:  Heparin level = 0.43 units/mL, therapeutic on IV heparin 1550 units/hr CBC: Hg low at 8; pltc WNL No interruptions in therapy or IV site issues per RN No bleeding issues per nursing   Goal of Therapy:  Heparin level 0.3-0.7 units/ml Monitor platelets by anticoagulation protocol: Yes   Plan:  Continue heparin infusion at 1550 units/hr Daily heparin level & CBC while on  heparin Heparin infusion to stop on 01/24/22 at 0300 per CCS (stop time entered) F/u ability to switch to oral anticoagulation after procedure  Netta Cedars, PharmD, BCPS Clinical Pharmacist  01/24/2022 1:42 AM

## 2022-01-24 NOTE — Progress Notes (Signed)
PROGRESS NOTE    Sherry Hampton  JSH:[id number] DOB: 1956-01-06 DOA: 11/17/2021 PCP: Pcp, No    No chief complaint on file.   Brief Narrative: 66 years old female with history of hyperlipidemia and mild memory loss presented to hospital with acute abnormal pain.  She was noted to have perforated viscus of the ascending colon with fecal peritonitis and required right colectomy and ileostomy.  Hospital course was complicated by fluid collection in the abdomen malnutrition progressive decline and encephalopathy.  [facility] was consulted for assistance with general medical management.     Assessment & Plan:   Principal Problem:   Perforated abdominal viscus Active Problems:   GERD (gastroesophageal reflux disease)   Hyperlipidemia   Hyponatremia   Acute blood loss anemia   Acute metabolic encephalopathy   Protein-calorie malnutrition, severe   Delirium   Necrosis of colon with perforation    Ileostomy in place (HCC)   Abnormal neurological exam   Memory loss   Hypercalcemia   Folate deficiency   Common bile duct dilatation   Enterococcus UTI   Goals of care, counseling/discussion   Vitamin B6 deficiency  #1 perforated abdominal viscus -Status post exploratory laparotomy with left colectomy and end ileostomy. -Per primary surgical team.  2.  Enterococcus UTI -Resolved -Status post completion of antibiotics recently.  3.  Episode of fever -Currently afebrile with T-max of 100 today but has since trended down. -Repeat blood cultures from 01/19/2022 with no growth x3 days and 1 bottle 1 bottle with staph epidermis likely contaminant, blood cultures from 01/17/2022 negative x5 days. -Chest x-ray done 01/16/2022 negative for any acute infiltrate. -Status post PICC line removal. -Patient recently completed course of antibiotics. -Dr. Louanne Belton spoke with Dr. Candiss Norse ID on 01/19/2022 who recommended repeat blood cultures which have been negative so far. -Continue to monitor off  antibiotics.  4.  Mild transaminitis -Improved.  5.  Acute metabolic encephalopathy  -MRI brain 12/11/2021 with no acute findings with repeat MRI 01/05/2022 negative. -EEG done showed slowing consistent with encephalopathy. -Neurology consulted during the hospitalization has since signed off. -Continue vitamin B6, folic acid.  6.  Vitamin B6 deficiency/folic acid deficiency -Continue supplementation.   7.  Hypomagnesemia/hypokalemia -Patient placed on oral potassium supplementation per primary team.   -Magnesium sulfate 2 g IV x1.    8.  Hypercalcemia -Improved. -PTH RP less than 2.  9.  Severe protein calorie malnutrition/failure to thrive/poor oral intake -Primary team recommended PEG tube family, palliative care discussed with family. -Patient seen by ethics committee. -IR consulted for PEG tube placement early on, patient being followed by general surgery and general surgery recommended PEG tube placement which was done this morning under endoscopy.   -Patient started on tube feeds.  -Per primary team.  10.  Goals of care -Palliative care following.    DVT prophylaxis: SCDs Code Status: Full Family Communication: No family at bedside. Disposition:   Status is: Inpatient Remains inpatient appropriate because: Severity of illness           Consultants:  Consult: Dr. Cathlean Sauer 1/30-31/2023 [facility] Palliative care Psychiatry Neurology  Procedures:  Status post exploratory laparotomy with right colectomy and ileostomy on 11/17/2021 PEG tube placement 01/24/2022  Antimicrobials:  Anti-infectives (From admission, onward)    Start     Dose/Rate Route Frequency Ordered Stop   01/01/22 2100  piperacillin-tazobactam (ZOSYN) IVPB 3.375 g  Status:  Discontinued        3.375 g 12.5 mL/hr over 240 Minutes Intravenous Every 8  hours 01/01/22 2042 01/10/22 1333   11/25/21 1000  piperacillin-tazobactam (ZOSYN) IVPB 3.375 g  Status:  Discontinued        3.375  g 12.5 mL/hr over 240 Minutes Intravenous Every 8 hours 11/25/21 0909 12/01/21 0822   11/17/21 1830  piperacillin-tazobactam (ZOSYN) IVPB 3.375 g        3.375 g 12.5 mL/hr over 240 Minutes Intravenous Every 8 hours 11/17/21 1415 11/22/21 2359   11/17/21 1230  piperacillin-tazobactam (ZOSYN) IVPB 3.375 g        3.375 g 100 mL/hr over 30 Minutes Intravenous  Once 11/17/21 1220 11/17/21 1304         Subjective: Sleeping, arousable.  No chest pain no shortness of breath.  Just returned from PEG tube placement.    Objective: Vitals:   01/24/22 0800 01/24/22 0810 01/24/22 0820 01/24/22 0830  BP: 130/66 132/67 138/67 128/64  Pulse: 88 82 88 83  Resp: (!) 22 19 19 17   Temp: 97.7 F (36.5 C)     TempSrc: Temporal     SpO2: 98% 97% 100% 97%  Weight:      Height:        Intake/Output Summary (Last 24 hours) at 01/24/2022 1318 Last data filed at 01/24/2022 1000 Gross per 24 hour  Intake 1930.65 ml  Output 860 ml  Net 1070.65 ml    Filed Weights   12/30/21 0635 01/20/22 0918 01/24/22 0653  Weight: 73.3 kg 79.7 kg 79.7 kg    Examination:  General exam: Frail.  Deconditioned.  Dry mucous membranes. Respiratory system: Clear to auscultation bilaterally anterior lung fields.  No wheezes, no crackles, no rhonchi.  Normal respiratory effort.  Cardiovascular system: RRR no murmurs rubs or gallops.  No JVD.  No lower extremity edema.  Gastrointestinal system: Abdomen is nondistended, soft and nontender.  Ileostomy in place, midline incision. 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  PEG tube in place.  Abdominal binder.  Normal bowel sounds heard. Central nervous system: Sleeping but arousable.. No focal neurological deficits. Extremities: Symmetric 5 x 5 power. Skin: No rashes, lesions or ulcers Psychiatry: Judgement and insight appear poor to fair. Mood & affect appropriate.     Data Reviewed: I have personally reviewed following labs and imaging  studies  CBC: Recent Labs  Lab 01/18/22 0449 01/20/22 0428 01/23/22 0446 01/24/22 0054  WBC 6.9 6.0 6.3 5.5  HGB 9.2* 8.5* 9.1* 8.0*  HCT 31.4* 28.9* 30.9* 28.0*  MCV 89.5 88.1 88.3 89.2  PLT 308 260 322 283     Basic Metabolic Panel: Recent Labs  Lab 01/18/22 0449 01/20/22 0428 01/21/22 0357 01/22/22 0434 01/23/22 0446 01/24/22 0054  NA 139 140 140 141  --  141  K 3.7 3.4* 3.4* 3.5  --  3.2*  CL 109 110 111 112*  --  112*  CO2 26 24 26 24   --  25  GLUCOSE 109* 100* 105* 105*  --  106*  BUN 14 8 5* 6*  --  7*  CREATININE 0.37* <0.30* 0.31* <0.30*  --  0.34*  CALCIUM 10.0 9.5 9.7 10.2  --  9.8  MG  --  1.6* 1.9 1.8 1.9 1.8  PHOS  --   --   --   --   --  2.6     GFR: Estimated Creatinine Clearance: 73.8 mL/min (A) (by C-G formula based on SCr of 0.34 mg/dL (L)).  Liver Function Tests: Recent Labs  Lab 01/20/22 0428 01/24/22 0054  AST 17  --   ALT 16  --   ALKPHOS 64  --   BILITOT 0.5  --   PROT 7.5  --   ALBUMIN 1.7* 1.7*     CBG: Recent Labs  Lab 01/24/22 1138  GLUCAP 96     Recent Results (from the past 240 hour(s))  Urine Culture     Status: Abnormal   Collection Time: 01/16/22  5:44 PM   Specimen: Urine, Clean Catch  Result Value Ref Range Status   Specimen Description   Final    URINE, CLEAN CATCH Performed at [facility], [place] [address], [place], [place] [address]    Special Requests   Final    NONE Performed at [facility], [place] [address], [place], [place] [address]    Culture MULTIPLE SPECIES PRESENT, SUGGEST RECOLLECTION (A)  Final   Report Status 01/18/2022 FINAL  Final  Culture, blood (Routine X 2) w Reflex to ID Panel     Status: None   Collection Time: 01/17/22  6:40 AM   Specimen: BLOOD LEFT HAND  Result Value Ref Range Status   Specimen Description   Final    BLOOD LEFT HAND Performed at [facility] [address] [place], [place] [address]    Special  Requests   Final    BOTTLES DRAWN AEROBIC ONLY Blood Culture results may not be optimal due to an inadequate volume of blood received in culture bottles Performed at [facility] [address], [place], [place] [address]    Culture   Final    NO GROWTH 5 DAYS Performed at [facility], [place] [address], [place], [place] [address]    Report Status 01/22/2022 FINAL  Final  Culture, blood (Routine X 2) w Reflex to ID Panel     Status: Abnormal   Collection Time: 01/17/22  7:06 AM   Specimen: BLOOD  Result Value Ref Range Status   Specimen Description   Final    BLOOD RIGHT WRIST Performed at [facility] [address] [place], [place] [address]    Special Requests   Final    BOTTLES DRAWN AEROBIC ONLY Blood Culture results may not be optimal due to an inadequate volume of blood received in culture bottles Performed at [facility] [address], [place],  [address]    Culture  Setup Time   Final    GRAM POSITIVE COCCI IN CLUSTERS AEROBIC BOTTLE ONLY CRITICAL RESULT CALLED TO, READ BACK BY AND VERIFIED WITH: J,GADHIA PHARMD @1050  01/18/22 EB    Culture (A)  Final    STAPHYLOCOCCUS EPIDERMIDIS THE SIGNIFICANCE OF ISOLATING THIS ORGANISM FROM A SINGLE SET OF BLOOD CULTURES WHEN MULTIPLE SETS ARE DRAWN IS UNCERTAIN. PLEASE NOTIFY THE MICROBIOLOGY DEPARTMENT WITHIN ONE WEEK IF SPECIATION AND SENSITIVITIES ARE REQUIRED. Performed at [facility], [place] [address], [place],  [address]    Report Status 01/20/2022 FINAL  Final  Blood Culture ID Panel (Reflexed)     Status: Abnormal   Collection Time: 01/17/22  7:06 AM  Result Value Ref Range Status   Enterococcus faecalis NOT DETECTED NOT DETECTED Final   Enterococcus Faecium NOT DETECTED NOT DETECTED Final   Listeria monocytogenes NOT DETECTED NOT DETECTED Final   Staphylococcus species DETECTED (A) NOT DETECTED Final    Comment: CRITICAL RESULT CALLED  TO, READ BACK BY AND VERIFIED WITH: J,GADHIA PHARMD @1050  01/18/22 EB  Staphylococcus aureus (BCID) NOT DETECTED NOT DETECTED Final   Staphylococcus epidermidis DETECTED (A) NOT DETECTED Final    Comment: Methicillin (oxacillin) resistant coagulase negative staphylococcus. Possible blood culture contaminant (unless isolated from more than one blood culture draw or clinical case suggests pathogenicity). No antibiotic treatment is indicated for blood  culture contaminants. CRITICAL RESULT CALLED TO, READ BACK BY AND VERIFIED WITH: J,GADHIA PHARMD @1050  01/18/22 EB    Staphylococcus lugdunensis NOT DETECTED NOT DETECTED Final   Streptococcus species NOT DETECTED NOT DETECTED Final   Streptococcus agalactiae NOT DETECTED NOT DETECTED Final   Streptococcus pneumoniae NOT DETECTED NOT DETECTED Final   Streptococcus pyogenes NOT DETECTED NOT DETECTED Final   A.calcoaceticus-baumannii NOT DETECTED NOT DETECTED Final   Bacteroides fragilis NOT DETECTED NOT DETECTED Final   Enterobacterales NOT DETECTED NOT DETECTED Final   Enterobacter cloacae complex NOT DETECTED NOT DETECTED Final   Escherichia coli NOT DETECTED NOT DETECTED Final   Klebsiella aerogenes NOT DETECTED NOT DETECTED Final   Klebsiella oxytoca NOT DETECTED NOT DETECTED Final   Klebsiella pneumoniae NOT DETECTED NOT DETECTED Final   Proteus species NOT DETECTED NOT DETECTED Final   Salmonella species NOT DETECTED NOT DETECTED Final   Serratia marcescens NOT DETECTED NOT DETECTED Final   Haemophilus influenzae NOT DETECTED NOT DETECTED Final   Neisseria meningitidis NOT DETECTED NOT DETECTED Final   Pseudomonas aeruginosa NOT DETECTED NOT DETECTED Final   Stenotrophomonas maltophilia NOT DETECTED NOT DETECTED Final   Candida albicans NOT DETECTED NOT DETECTED Final   Candida auris NOT DETECTED NOT DETECTED Final   Candida glabrata NOT DETECTED NOT DETECTED Final   Candida krusei NOT DETECTED NOT DETECTED Final   Candida  parapsilosis NOT DETECTED NOT DETECTED Final   Candida tropicalis NOT DETECTED NOT DETECTED Final   Cryptococcus neoformans/gattii NOT DETECTED NOT DETECTED Final   Methicillin resistance mecA/C DETECTED (A) NOT DETECTED Final    Comment: CRITICAL RESULT CALLED TO, READ BACK BY AND VERIFIED WITH: J,GADHIA PHARMD @1050  01/18/22 EB Performed at [facility], [address] [place], [place] [address]   Culture, blood (routine x 2)     Status: Abnormal   Collection Time: 01/19/22 11:27 AM   Specimen: BLOOD  Result Value Ref Range Status   Specimen Description   Final    BLOOD BLOOD RIGHT HAND Performed at [facility] [address], [place], [place] [address]    Special Requests   Final    BOTTLES DRAWN AEROBIC ONLY Blood Culture adequate volume Performed at [facility] [address] [place], [place] [address]    Culture  Setup Time   Final    GRAM POSITIVE COCCI IN CLUSTERS AEROBIC BOTTLE ONLY CRITICAL VALUE NOTED.  VALUE IS CONSISTENT WITH PREVIOUSLY REPORTED AND CALLED VALUE. Performed at [facility], [place] [address], [place], [place] [address]    Culture STAPHYLOCOCCUS EPIDERMIDIS (A)  Final   Report Status 01/22/2022 FINAL  Final   Organism ID, Bacteria STAPHYLOCOCCUS EPIDERMIDIS  Final      Susceptibility   Staphylococcus epidermidis - MIC*    CIPROFLOXACIN >=8 RESISTANT Resistant     ERYTHROMYCIN >=8 RESISTANT Resistant     GENTAMICIN <=0.5 SENSITIVE Sensitive     OXACILLIN >=4 RESISTANT Resistant     TETRACYCLINE 2 SENSITIVE Sensitive     VANCOMYCIN 1 SENSITIVE Sensitive     TRIMETH/SULFA 80 RESISTANT Resistant     CLINDAMYCIN >=8 RESISTANT Resistant     RIFAMPIN <=0.5 SENSITIVE  Sensitive     Inducible Clindamycin NEGATIVE Sensitive     * STAPHYLOCOCCUS EPIDERMIDIS  Culture, blood (routine x 2)     Status: None   Collection Time: 01/19/22 11:33 AM   Specimen: BLOOD  Result Value Ref Range Status   Specimen  Description   Final    BLOOD BLOOD LEFT HAND Performed at [facility] [address] [place], [place] [address]    Special Requests   Final    BOTTLES DRAWN AEROBIC ONLY Blood Culture results may not be optimal due to an inadequate volume of blood received in culture bottles Performed at [facility] [address], [place], [place] [address]    Culture   Final    NO GROWTH 5 DAYS Performed at [facility], [place] [address], [place], [place] [address]    Report Status 01/24/2022 FINAL  Final  Urine Culture     Status: Abnormal   Collection Time: 01/20/22 12:41 PM   Specimen: In/Out Cath Urine  Result Value Ref Range Status   Specimen Description   Final    IN/OUT CATH URINE Performed at [facility] [address] [place], [place] [address]    Special Requests   Final    NONE Performed at [facility], [place] [address] [place], [place] [address]    Culture MULTIPLE SPECIES PRESENT, SUGGEST RECOLLECTION (A)  Final   Report Status 01/21/2022 FINAL  Final          Radiology Studies: CT ABDOMEN PELVIS W CONTRAST  Result Date: 01/22/2022 CLINICAL DATA:  Postoperative abdominal pain, status post right colectomy secondary to perforated colon 11/17/2021 EXAM: CT ABDOMEN AND PELVIS WITH CONTRAST TECHNIQUE: Multidetector CT imaging of the abdomen and pelvis was performed using the standard protocol following bolus administration of intravenous contrast. RADIATION DOSE REDUCTION: This exam was performed according to the departmental dose-optimization program which includes automated exposure control, adjustment of the mA and/or kV according to patient size and/or use of iterative reconstruction technique. CONTRAST:  177mL OMNIPAQUE IOHEXOL 300 MG/ML  SOLN COMPARISON:  01/01/2022 FINDINGS: Lower chest: No acute abnormality. Coronary artery calcifications. Unchanged trace right pleural effusion associated  atelectasis or consolidation. Hepatobiliary: No solid liver abnormality is seen. No gallstones, gallbladder wall thickening, or biliary dilatation. Pancreas: Unremarkable. No pancreatic ductal dilatation or surrounding inflammatory changes. Spleen: Normal in size without significant abnormality. Adrenals/Urinary Tract: Adrenal glands are unremarkable. Small nonobstructive calculi in the inferior pole of the left kidney (series 4, image 52). Bladder is unremarkable. Stomach/Bowel: Stomach is within normal limits. Appendix appears normal. 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 Diverticula of the remaining distal ileum (series 2, image 62) and of the remaining descending and sigmoid colon. Chronic wall thickening of the sigmoid colon without evidence of acute diverticulitis. Vascular/Lymphatic: Scattered aortic atherosclerosis. Expansile, hypodense thrombus within the bilateral common femoral veins, with fat stranding about the vessels (series 2, image 87). No enlarged abdominal or pelvic lymph nodes. Reproductive: Calcified uterine fibroids. Other: No abdominal wall hernia or abnormality. No ascites. There is near complete resolution of previously seen heterogeneous fat stranding and fluid in the low right hemiabdomen, with some residual, tubular and bandlike elements in this vicinity (series 2, image 73). Musculoskeletal: No acute or significant osseous findings. IMPRESSION: 1. 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 2. Near complete resolution of previously seen  heterogeneous fat stranding and fluid in the low right hemiabdomen, with some residual, tubular and bandlike elements in this vicinity. Findings are consistent with resolving postoperative hematoma/seroma with fat necrosis. There is no discrete fluid collection within the abdomen at this time. 3. Ileal  and colonic diverticulosis without evidence of acute diverticulitis 4. Incidental note of expansile, hypodense thrombus within the bilateral common femoral veins, with fat stranding about the vessels. 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 5. Nonobstructive left nephrolithiasis. These results will be called to the ordering clinician or representative by the Radiologist Assistant, and communication documented in the PACS or [employer]. Aortic Atherosclerosis (ICD10-I70.0). Electronically Signed   By: Delanna Ahmadi M.D.   On: 01/22/2022 13:49        Scheduled Meds:  chlorhexidine  15 mL Mouth Rinse BID   ferrous sulfate  220 mg Per Tube TID WC   folic acid  1 mg Per Tube Daily   insulin aspart  0-9 Units Subcutaneous Q4H   mouth rinse  15 mL Mouth Rinse q12n4p   multivitamin with minerals  1 tablet Per Tube Daily   niacin  100 mg Per Tube QHS   pantoprazole sodium  40 mg Per Tube Daily   potassium chloride  20 mEq Per Tube BID   psyllium  1 packet Oral BID   vitamin B-6  100 mg Per Tube Daily   thiamine  100 mg Per Tube BID   Continuous Infusions:  dextrose 5 % and 0.9 % NaCl with KCl 20 mEq/L 50 mL/hr at 01/24/22 0900   feeding supplement (OSMOLITE 1.5 CAL) 1,000 mL (01/24/22 1218)   heparin 1,550 Units/hr (01/24/22 1210)     LOS: 68 days    Time spent: 35 minutes    Irine Seal, MD [facility]   To contact the attending provider between 7A-7P or the covering provider during after hours 7P-7A, please log into the web site [email or web address] and access using universal [place] password for that web site. If you do not have the password, please call the hospital operator.  01/24/2022, 1:18 PM

## 2022-01-24 NOTE — Progress Notes (Signed)
ANTICOAGULATION CONSULT NOTE  Pharmacy Consult for Heparin Indication: Bilateral femoral DVTs  No Known Allergies  Patient Measurements: Height: 5\' 10"  (177.8 cm) Weight: 79.7 kg (175 lb 11.3 oz) IBW/kg (Calculated) : 68.5 Heparin Dosing Weight: total body weight  Vital Signs: Temp: 98.2 F (36.8 C) (02/03 1342) Temp Source: Oral (02/03 1342) BP: 129/76 (02/03 1342) Pulse Rate: 95 (02/03 1342)  Labs: Recent Labs    01/22/22 0434 01/22/22 2157 01/23/22 0446 01/23/22 0707 01/24/22 0054 01/24/22 1855 01/24/22 2046  HGB  --   --  9.1*  --  8.0*  --   --   HCT  --   --  30.9*  --  28.0*  --   --   PLT  --   --  322  --  283  --   --   HEPARINUNFRC  --    < >  --    < > 0.43 <0.10* 0.25*  CREATININE <0.30*  --   --   --  0.34*  --   --    < > = values in this interval not displayed.     Estimated Creatinine Clearance: 73.8 mL/min (A) (by C-G formula based on SCr of 0.34 mg/dL (L)).   Medical History: Past Medical History:  Diagnosis Date   Anemia    past hx    Arthritis    wrists , knees,  feet    Cataract    cordical cataracts per pt    Constipation    takes a stool softener  daily- as long as does this has daily soft stools    GERD (gastroesophageal reflux disease)    Hemorrhoids    Hyperlipidemia     Assessment: 66 yo female admitted 11/17/21 with 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 On 01/22/22, found to have bilateral femoral DVTs on CT scan.  Pharmacy consulted to dose IV heparin; has been receiving lovenox 40mg  q24h - last dose 01/22/22 at 0720.   01/24/22:  Heparin drip was held @ 0300 this AM for PEG placement. Per CCS, resume heparin drip @1200  today with no bolus. Heparin level < 0.1 is suspicious since previous level was therapeutic on the same heparin rate; recheck level 0.25 is slightly subtherapeutic No bleeding noted per d/w RN  Goal of Therapy:  Heparin level 0.3-0.7 units/ml Monitor platelets by anticoagulation protocol:  Yes   Plan:  Increase heparin drip to 1650 units/hr  Recheck HL in 8 hours  Daily heparin level & CBC while on heparin F/u ability to switch to oral anticoagulation  Peggyann Juba, PharmD, BCPS Pharmacy: [phone] 01/24/2022 9:12 PM

## 2022-01-24 NOTE — TOC Progression Note (Signed)
Transition of Care [facility]) - Progression Note    Patient Details  Name: Sherry Hampton MRN: [id number] Date of Birth: 1956/06/08  [facility] CM/SW Contact  Lennart Pall, LCSW Phone Number: 01/24/2022, 10:05 AM  Clinical Narrative:    Aware that peg placed today after receiving consent from pt's sister, [name].  Will now restart SNF bed search and will reach out to facility in [place] (family interested in getting pt to that facility) and update that peg now in place and will send current notes for review.   Expected Discharge Plan: [place] Barriers to Discharge: Continued Medical Work up, Ship broker, SNF Pending bed offer  Expected Discharge Plan and Services Expected Discharge Plan: [place] In-house Referral: Clinical Social Work     Living arrangements for the past 2 months: Single Family Home                                       Social Determinants of Health (SDOH) Interventions    Readmission Risk Interventions No flowsheet data found.

## 2022-01-24 NOTE — Progress Notes (Signed)
ANTICOAGULATION CONSULT NOTE  Pharmacy Consult for Heparin Indication: Bilateral femoral DVTs  No Known Allergies  Patient Measurements: Height: 5\' 10"  (177.8 cm) Weight: 79.7 kg (175 lb 11.3 oz) IBW/kg (Calculated) : 68.5 Heparin Dosing Weight: total body weight  Vital Signs: Temp: 97.7 F (36.5 C) (02/03 0800) Temp Source: Temporal (02/03 0800) BP: 128/64 (02/03 0830) Pulse Rate: 83 (02/03 0830)  Labs: Recent Labs    01/22/22 0434 01/22/22 2157 01/23/22 0446 01/23/22 0707 01/23/22 1726 01/24/22 0054  HGB  --   --  9.1*  --   --  8.0*  HCT  --   --  30.9*  --   --  28.0*  PLT  --   --  322  --   --  283  HEPARINUNFRC  --    < >  --  0.30 0.25* 0.43  CREATININE <0.30*  --   --   --   --  0.34*   < > = values in this interval not displayed.     Estimated Creatinine Clearance: 73.8 mL/min (A) (by C-G formula based on SCr of 0.34 mg/dL (L)).   Medical History: Past Medical History:  Diagnosis Date   Anemia    past hx    Arthritis    wrists , knees,  feet    Cataract    cordical cataracts per pt    Constipation    takes a stool softener  daily- as long as does this has daily soft stools    GERD (gastroesophageal reflux disease)    Hemorrhoids    Hyperlipidemia     Assessment: 66 yo female admitted 11/17/21 with 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 On 01/22/22, found to have bilateral femoral DVTs on CT scan.  Pharmacy consulted to dose IV heparin; has been receiving lovenox 40mg  q24h - last dose 01/22/22 at 0720.   01/24/22:  Heparin level = 0.43 units/mL, therapeutic on IV heparin 1550 units/hr CBC: Hg low at 8; pltc WNL Heparin drip was held @ 0300 this AM for PEG placement. Per CCS, resume heparin drip @1200  today with no bolus.  Goal of Therapy:  Heparin level 0.3-0.7 units/ml Monitor platelets by anticoagulation protocol: Yes   Plan:  Resume heparin infusion at 1550 units/hr @1200  today Check HL 6 hours after heparin resumed Daily  heparin level & CBC while on heparin F/u ability to switch to oral anticoagulation  Dimple Nanas, PharmD 01/24/2022 8:57 AM

## 2022-01-24 NOTE — Transfer of Care (Signed)
Immediate Anesthesia Transfer of Care Note  Patient: Sherry Hampton  Procedure(s) Performed: ESOPHAGOGASTRODUODENOSCOPY (EGD) PERCUTANEOUS ENDOSCOPIC GASTROSTOMY (PEG) PLACEMENT  Patient Location: PACU and Endoscopy Unit  Anesthesia Type:MAC  Level of Consciousness: awake and patient cooperative  Airway & Oxygen Therapy: Patient Spontanous Breathing and Patient connected to face mask oxygen  Post-op Assessment: Report given to RN and Post -op Vital signs reviewed and stable  Post vital signs: Reviewed and stable  Last Vitals:  Vitals Value Taken Time  BP 130/66 01/24/22 0800  Temp    Pulse 88 01/24/22 0800  Resp 22 01/24/22 0800  SpO2 98 % 01/24/22 0800  Vitals shown include unvalidated device data.  Last Pain:  Vitals:   01/24/22 0653  TempSrc: Tympanic  PainSc:       Patients Stated Pain Goal: 0 (29/24/46 2863)  Complications: No notable events documented.

## 2022-01-24 NOTE — Anesthesia Procedure Notes (Signed)
Procedure Name: MAC Date/Time: 01/24/2022 7:34 AM Performed by: Lollie Sails, CRNA Pre-anesthesia Checklist: Patient identified, Emergency Drugs available, Suction available, Patient being monitored and Timeout performed Oxygen Delivery Method: Simple face mask Preoxygenation: POM used. Placement Confirmation: positive ETCO2

## 2022-01-24 NOTE — Progress Notes (Signed)
Consent was given to Santiago Glad in Endo.

## 2022-01-24 NOTE — Progress Notes (Signed)
Family granted consent over the phone with nursing staff overnight.  I had discussed feeding tube placement earlier in the week with the family.  We will proceed today with percutaneous endoscopic gastrostomy tube placement.    Felicie Morn, MD General, Bariatric and Minimally Invasive Surgery [facility], [place]

## 2022-01-24 NOTE — Anesthesia Postprocedure Evaluation (Signed)
Anesthesia Post Note  Patient: Engineer, maintenance  Procedure(s) Performed: ESOPHAGOGASTRODUODENOSCOPY (EGD) PERCUTANEOUS ENDOSCOPIC GASTROSTOMY (PEG) PLACEMENT     Patient location during evaluation: PACU Anesthesia Type: MAC Level of consciousness: awake and alert Pain management: pain level controlled Vital Signs Assessment: post-procedure vital signs reviewed and stable Respiratory status: spontaneous breathing Cardiovascular status: stable Anesthetic complications: no   No notable events documented.  Last Vitals:  Vitals:   01/24/22 0830 01/24/22 1342  BP: 128/64 129/76  Pulse: 83 95  Resp: 17 18  Temp:  36.8 C  SpO2: 97% 100%    Last Pain:  Vitals:   01/24/22 1342  TempSrc: Oral  PainSc:                  Sherry Hampton

## 2022-01-24 NOTE — NC FL2 (Signed)
[place] LEVEL OF CARE SCREENING TOOL     IDENTIFICATION  Patient Name: Sherry Hampton Birthdate: 11/15/1956 Sex: female Admission Date (Current Location): 11/17/2021  [facility] and [place] Number:  Herbalist and Address:  [facility],  [place], [place]      Provider Number: [id number]  Attending Physician Name and Address:  Edison Pace, Md, MD  Relative Name and Phone Number:  [name] [phone]    Current Level of Care: Hospital Recommended Level of Care: [place] Prior Approval Number:    Date Approved/Denied:   PASRR Number: [id number]  Discharge Plan: SNF    Current Diagnoses: Patient Active Problem List   Diagnosis Date Noted   Goals of care, counseling/discussion 01/07/2022   Vitamin B6 deficiency 01/07/2022   Folate deficiency 01/02/2022   Common bile duct dilatation 01/02/2022   Enterococcus UTI 01/02/2022   Hypercalcemia 12/31/2021   Abnormal neurological exam 12/28/2021   Memory loss 12/28/2021   Necrosis of colon with perforation  12/19/2021   Fatigue 12/19/2021   Joint pain 12/19/2021   Obesity 12/19/2021   Osteoarthritis of knee 12/19/2021   Other long term (current) drug therapy 12/19/2021   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   Delirium    Protein-calorie malnutrition, severe 11/26/2021   GERD (gastroesophageal reflux disease)    Hyperlipidemia    Hyponatremia    Acute blood loss anemia    Acute metabolic encephalopathy    Perforated abdominal viscus 11/17/2021   Fungal infection of toenail 10/18/2015   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   Avitaminosis D 07/10/2015   CN (constipation) 01/11/2015   Hemorrhoids, internal 01/11/2015   Rheumatoid arthritis ([place]) 01/04/2013    Orientation RESPIRATION BLADDER Height & Weight     Self  Normal External catheter, Incontinent Weight: 175 lb 11.3 oz (79.7 kg) Height:  5\' 10"  (177.8 cm)  BEHAVIORAL SYMPTOMS/MOOD NEUROLOGICAL  BOWEL NUTRITION STATUS      Colostomy Feeding tube  AMBULATORY STATUS COMMUNICATION OF NEEDS Skin   Total Care Verbally Surgical wounds, Other (Comment) (pressure injury left thigh)                       Personal Care Assistance Level of Assistance  Bathing, Feeding, Dressing, Total care Bathing Assistance: Maximum assistance Feeding assistance: Maximum assistance Dressing Assistance: Maximum assistance Total Care Assistance: Maximum assistance   Functional Limitations Info             SPECIAL CARE FACTORS FREQUENCY  PT (By licensed PT), OT (By licensed OT)     PT Frequency: 5x/wk OT Frequency: 5x/wk            Contractures Contractures Info: Not present    Additional Factors Info  Code Status, Allergies, Insulin Sliding Scale Code Status Info: Full Allergies Info: NKDA   Insulin Sliding Scale Info: see MAR       Current Medications (01/24/2022):  This is the current hospital active medication list Current Facility-Administered Medications  Medication Dose Route Frequency Provider Last Rate Last Admin   chlorhexidine (PERIDEX) 0.12 % solution 15 mL  15 mL Mouth Rinse BID [name], MD   15 mL at 01/24/22 0906   dextrose 5 % and 0.9 % NaCl with KCl 20 mEq/L infusion   Intravenous Continuous Cherene Altes, MD 50 mL/hr at 01/24/22 0900 Infusion Verify at 01/24/22 0900   feeding supplement (OSMOLITE 1.5 CAL) liquid 1,000 mL  1,000 mL Per Tube Continuous Stechschulte,  Nickola Major, MD 15 mL/hr at 01/24/22 1218 1,000 mL at 01/24/22 1218   ferrous sulfate 300 (60 Fe) MG/5ML syrup 220 mg  220 mg Per Tube TID WC Barkley Boards R, PA-C   220 mg at 63/01/60 1093   folic acid (FOLVITE) tablet 1 mg  1 mg Per Tube Daily Norm Parcel, PA-C   1 mg at 01/24/22 2355   heparin ADULT infusion 100 units/mL (25000 units/299mL)  1,550 Units/hr Intravenous Continuous Dimple Nanas, RPH 15.5 mL/hr at 01/24/22 1210 1,550 Units/hr at 01/24/22 1210   ibuprofen (ADVIL) 100 MG/5ML  suspension 100 mg  100 mg Per Tube Once PRN Norm Parcel, PA-C       ibuprofen (ADVIL) 100 MG/5ML suspension 400 mg  400 mg Per Tube Q6H PRN Norm Parcel, PA-C       insulin aspart (novoLOG) injection 0-9 Units  0-9 Units Subcutaneous Q4H Barkley Boards R, PA-C       lip balm (CARMEX) ointment 1 application  1 application Topical PRN [name], PA-C   1 application at 73/22/02 1110   MEDLINE mouth rinse  15 mL Mouth Rinse q12n4p [name], MD   15 mL at 01/24/22 1219   multivitamin with minerals tablet 1 tablet  1 tablet Per Tube Daily Norm Parcel, PA-C   1 tablet at 01/24/22 5427   niacin tablet 100 mg  100 mg Per Tube QHS Barkley Boards R, PA-C       ondansetron (ZOFRAN-ODT) disintegrating tablet 4 mg  4 mg Oral Q6H PRN [name], MD       Or   ondansetron (ZOFRAN) injection 4 mg  4 mg Intravenous Q6H PRN [name], MD       oxyCODONE (ROXICODONE) 5 MG/5ML solution 2.5 mg  2.5 mg Per Tube Q4H PRN Norm Parcel, PA-C       pantoprazole sodium (PROTONIX) 40 mg/20 mL oral suspension 40 mg  40 mg Per Tube Daily Barkley Boards R, PA-C       potassium chloride (KLOR-CON) packet 20 mEq  20 mEq Per Tube BID Norm Parcel, PA-C   20 mEq at 01/24/22 0905   psyllium (HYDROCIL/METAMUCIL) 1 packet  1 packet Oral BID Ileana Roup, MD   1 packet at 01/20/22 2141   pyridOXINE (VITAMIN B-6) tablet 100 mg  100 mg Per Tube Daily Norm Parcel, PA-C   100 mg at 01/24/22 0623   thiamine tablet 100 mg  100 mg Per Tube BID Norm Parcel, PA-C   100 mg at 01/24/22 7628     Discharge Medications: Please see discharge summary for a list of discharge medications.  Relevant Imaging Results:  Relevant Lab Results:   Additional Information [phone]  Lennart Pall, LCSW

## 2022-01-24 NOTE — Progress Notes (Signed)
Nutrition Follow-up  DOCUMENTATION CODES:   Severe malnutrition in context of acute illness/injury  INTERVENTION:   Monitor magnesium, potassium, and phosphorus BID for at least 3 days or until pt meets goal rate, MD to replete as needed, as pt is at risk for refeeding syndrome.   -Initiate Osmolite 1.5 @ 15 ml/hr via PEG x24 hrs -If tolerates, Advance by 10 ml every 24 hours to goal rate of 60 ml/hr. -provides 2160 kcals, 90g protein and 1097 ml H2O -Will need free water flushes of 200 ml every 6 hours (800 ml)   -Continue Multivitamin with minerals daily via tube  NUTRITION DIAGNOSIS:   Severe Malnutrition related to acute illness as evidenced by energy intake < or equal to 50% for > or equal to 5 days, moderate fat depletion, severe muscle depletion.  Ongoing. Now likely related to chronic illness. Energy intake <75% for >/= 1 month.  GOAL:   Patient will meet greater than or equal to 90% of their needs  Not meeting but being started on tube feeds today.  MONITOR:   PO intake, Supplement acceptance, Labs, Weight trends, I & O's (TPN)  REASON FOR ASSESSMENT:   Consult Enteral/tube feeding initiation and management  ASSESSMENT:   66 year old female who presents to the emergency department with sudden onset of abdominal pain and distention.CT scan of abdomen and pelvis shows findings consistent with free air with a large pneumoperitoneum.  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  11/27: s/p ex lap, right colectomy, ileostomy 12/11: PICC placed 12/12: TPN initiated 1/17: TPN discontinued 1/26: PICC removed 2/3: s/p UGI endoscopy with PEG placement   Surgery placed PEG this morning. RD received consult from surgery to begin trickle rate tube feeds at 15 ml/hr. Will start a this rate, and potentially increase by 10 ml every 24 hours to ensure tolerance.  Reviewed advancement instructions with RN. Outlined advancement in tube feeding  order as well.   Per nursing documentation, pt with mild BLE edema.  Admission weight: 176 lbs Current weight: 175 lbs  Medications: Ferrous sulfate, Folic acid, Multivitamin with minerals daily, KLOR-CON, niacin, Metamucil, vitamin B-6, Thiamine, D5 infusion (~204 kcals)   Labs reviewed: CBGs: 96 Low K  Diet Order:   Diet Order             Diet NPO time specified Except for: Sips with Meds  Diet effective midnight                   EDUCATION NEEDS:   Education needs have been addressed  Skin:  Skin Assessment: Skin Integrity Issues: Skin Integrity Issues:: Incisions, Other (Comment) Incisions: 11/27 abdomen Other: left thigh pressure injury  Last BM:  2/3 -ileostomy  Height:   Ht Readings from Last 1 Encounters:  01/24/22 5\' 10"  (1.778 m)    Weight:   Wt Readings from Last 1 Encounters:  01/24/22 79.7 kg    BMI:  Body mass index is 25.21 kg/m.  Estimated Nutritional Needs:   Kcal:  1950-2150  Protein:  95-105g  Fluid:  2L/day  Clayton Bibles, MS, RD, LDN Inpatient Clinical Dietitian Contact information available via Amion

## 2022-01-24 NOTE — Op Note (Signed)
[facility] Patient Name: Sherry Hampton Procedure Date: 01/24/2022 MRN: [id number] Attending MD: Felicie Morn ,  Date of Birth: 09/07/1956 CSN: [id number] Age: 66 Admit Type: Inpatient Procedure:                UGI Endoscopy with Peg Indications:               Providers:                Felicie Morn, Princess Bruins, RN, Jeanella Cara, RN, Tyna Jaksch Technician Referring MD:              Medicines:                Monitored Anesthesia Care Complications:            No immediate complications. Estimated Blood Loss:     Estimated blood loss was minimal. Procedure:                Pre-Anesthesia Assessment:                           - Prior to the procedure, a History and Physical                            was performed, and patient medications and                            allergies were reviewed. The patient is unable to                            give consent secondary to the patient being legally                            incompetent to consent. The risks and benefits of                            the procedure and the sedation options and risks                            were discussed with the patient. All questions were                            answered and informed consent was obtained. Patient                            identification and proposed procedure were verified                            in the pre-procedure area. Mental Status                            Examination: alert but confused. Airway  Examination: normal oropharyngeal airway and neck                            mobility. Respiratory Examination: clear to                            auscultation. CV Examination: normal. ASA Grade                            Assessment: III - A patient with severe systemic                            disease. After reviewing the risks and benefits,                            the patient was  deemed in satisfactory condition to                            undergo the procedure. The anesthesia plan was to                            use monitored anesthesia care (MAC). Immediately                            prior to administration of medications, the patient                            was re-assessed for adequacy to receive sedatives.                            The heart rate, respiratory rate, oxygen                            saturations, blood pressure, adequacy of pulmonary                            ventilation, and response to care were monitored                            throughout the procedure. The physical status of                            the patient was re-assessed after the procedure.                           After obtaining informed consent, the endoscope was                            passed under direct vision. Throughout the                            procedure, the patient's blood pressure, pulse, and  oxygen saturations were monitored continuously. The                            [id number]-H190 ([id number]) Olympus endoscope was introduced                            through the mouth, and advanced to the pylorus.                            After obtaining informed consent, the endoscope was                            passed under direct vision. Throughout the                            procedure, the patient's blood pressure, pulse, and                            oxygen saturations were monitored continuously.The                            upper GI endoscopy was accomplished without                            difficulty. The patient tolerated the procedure                            well. Scope In: Scope Out: Findings:      The entire examined stomach was normal.      The examined esophagus was normal.      The patient was placed in the supine position for PEG placement. The       stomach was insufflated to appose gastric and abdominal  walls. A site       was located in the body of the stomach with good manual external       pressure for placement. The abdominal wall was marked and prepped in a       sterile manner. The area was anesthetized with 4 mL of 1% lidocaine. The       trocar needle was introduced through the abdominal wall and into the       stomach under direct endoscopic view. A snare was introduced through the       endoscope and opened in the gastric lumen. The guide wire was passed       through the trocar and into the open snare. The snare was closed around       the guide wire. The endoscope and snare were removed, pulling the wire       out through the mouth. A skin incision was made at the site of needle       insertion. The externally removable 24 Fr button gastrostomy tube was       lubricated. The G-tube was tied to the guide wire and pulled through the       mouth and into the stomach. The trocar needle was removed, and the       gastrostomy tube was pulled out from the stomach through the skin. The  external bumper was attached to the gastrostomy tube, and the tube was       cut to remove the guide wire. The final position of the gastrostomy tube       was confirmed by relook endoscopy, and skin marking noted to be 2.5 cm       at the external bumper. The final tension and compression of the       abdominal wall by the PEG tube and external bumper were checked and       revealed that the bumper was moderately tight and mildly deforming the       skin. The feeding tube was capped, and the tube site cleaned and dressed. Impression:               - Normal stomach.                           - Normal esophagus.                           - No specimens collected. Moderate Sedation:      Moderate (conscious) sedation was personally administered by an       anesthesia professional. The following parameters were monitored: oxygen       saturation, heart rate, blood pressure, and response to care.  Total       physician intraservice time was 15 minutes. Recommendation:           - Return patient to hospital ward for ongoing care. Procedure Code(s):        --- Professional ---                           [phone], Esophagogastroduodenoscopy, flexible,                            transoral; with directed placement of percutaneous                            gastrostomy tube Diagnosis Code(s):        --- Professional ---                           R63.3, Feeding difficulties CPT copyright 2019 [employer]. All rights reserved. The codes documented in this report are preliminary and upon coder review may  be revised to meet current compliance requirements. [place],  01/24/2022 8:06:03 AM Number of Addenda: 0

## 2022-01-25 LAB — CBC
HCT: 31.9 % — ABNORMAL LOW (ref 36.0–46.0)
Hemoglobin: 9.2 g/dL — ABNORMAL LOW (ref 12.0–15.0)
MCH: 25.4 pg — ABNORMAL LOW (ref 26.0–34.0)
MCHC: 28.8 g/dL — ABNORMAL LOW (ref 30.0–36.0)
MCV: 88.1 fL (ref 80.0–100.0)
Platelets: 285 10*3/uL (ref 150–400)
RBC: 3.62 MIL/uL — ABNORMAL LOW (ref 3.87–5.11)
RDW: 18.1 % — ABNORMAL HIGH (ref 11.5–15.5)
WBC: 9.5 10*3/uL (ref 4.0–10.5)
nRBC: 0 % (ref 0.0–0.2)

## 2022-01-25 LAB — GLUCOSE, CAPILLARY
Glucose-Capillary: 117 mg/dL — ABNORMAL HIGH (ref 70–99)
Glucose-Capillary: 119 mg/dL — ABNORMAL HIGH (ref 70–99)
Glucose-Capillary: 119 mg/dL — ABNORMAL HIGH (ref 70–99)
Glucose-Capillary: 125 mg/dL — ABNORMAL HIGH (ref 70–99)
Glucose-Capillary: 127 mg/dL — ABNORMAL HIGH (ref 70–99)
Glucose-Capillary: 132 mg/dL — ABNORMAL HIGH (ref 70–99)

## 2022-01-25 LAB — HEPARIN LEVEL (UNFRACTIONATED)
Heparin Unfractionated: 0.37 IU/mL (ref 0.30–0.70)
Heparin Unfractionated: 0.19 IU/mL — ABNORMAL LOW (ref 0.30–0.70)
Heparin Unfractionated: 0.79 IU/mL — ABNORMAL HIGH (ref 0.30–0.70)

## 2022-01-25 LAB — MAGNESIUM: Magnesium: 1.9 mg/dL (ref 1.7–2.4)

## 2022-01-25 LAB — POTASSIUM: Potassium: 4.4 mmol/L (ref 3.5–5.1)

## 2022-01-25 LAB — PHOSPHORUS: Phosphorus: 2.2 mg/dL — ABNORMAL LOW (ref 2.5–4.6)

## 2022-01-25 MED ORDER — LACTATED RINGERS IV BOLUS: 1000.0000 mL | Freq: Three times a day (TID) | INTRAVENOUS | Status: AC | PRN

## 2022-01-25 MED ORDER — DEXTROSE 5 % IV SOLN
30.0000 mmol | Freq: Once | INTRAVENOUS | Status: AC
  Administered 2022-01-25: 30 mmol via INTRAVENOUS
  Filled 2022-01-25: qty 10

## 2022-01-25 MED ORDER — FERROUS SULFATE 300 (60 FE) MG/5ML PO SYRP
220.0000 mg | ORAL_SOLUTION | Freq: Two times a day (BID) | ORAL | Status: DC
  Administered 2022-01-25 – 2022-02-05 (×20): 220 mg
  Filled 2022-01-25 (×22): qty 5

## 2022-01-25 MED ORDER — ACETAMINOPHEN 650 MG RE SUPP: 650.0000 mg | Freq: Four times a day (QID) | RECTAL | Status: DC | PRN

## 2022-01-25 MED ORDER — ACETAMINOPHEN 325 MG PO TABS: 325.0000 mg | ORAL_TABLET | Freq: Four times a day (QID) | ORAL | Status: DC | PRN

## 2022-01-25 MED ORDER — OSMOLITE 1.5 CAL PO LIQD
1000.0000 mL | ORAL | Status: DC
  Administered 2022-01-25: 1000 mL
  Filled 2022-01-25: qty 1000

## 2022-01-25 MED ORDER — SODIUM CHLORIDE 0.9 % IV SOLN: INTRAVENOUS | Status: DC

## 2022-01-25 NOTE — Progress Notes (Signed)
PROGRESS NOTE    Sherry Hampton  NGE:[id number] DOB: 1956-07-20 DOA: 11/17/2021 PCP: Pcp, No    No chief complaint on file.   Brief Narrative: 66 years old female with history of hyperlipidemia and mild memory loss presented to hospital with acute abnormal pain.  She was noted to have perforated viscus of the ascending colon with fecal peritonitis and required right colectomy and ileostomy.  Hospital course was complicated by fluid collection in the abdomen malnutrition progressive decline and encephalopathy.  [facility] was consulted for assistance with general medical management.     Assessment & Plan:   Principal Problem:   Perforated abdominal viscus Active Problems:   GERD (gastroesophageal reflux disease)   Hyperlipidemia   Hyponatremia   Acute blood loss anemia   Acute metabolic encephalopathy   Protein-calorie malnutrition, severe   Delirium   Necrosis of colon with perforation    Ileostomy in place (HCC)   Abnormal neurological exam   Memory loss   Hypercalcemia   Folate deficiency   Common bile duct dilatation   Enterococcus UTI   Goals of care, counseling/discussion   Vitamin B6 deficiency  #1 perforated abdominal viscus -Status post exploratory laparotomy with left colectomy and end ileostomy. -Per primary surgical team.  2.  Enterococcus UTI -Resolved -Status post completion of antibiotics recently.  3.  Episode of fever -Currently afebrile with T-max of 100 today but has since trended down. -Repeat blood cultures from 01/19/2022 with no growth x3 days and 1 bottle 1 bottle with staph epidermis likely contaminant, blood cultures from 01/17/2022 negative x5 days. -Chest x-ray done 01/16/2022 negative for any acute infiltrate. -Status post PICC line removal. -Patient recently completed course of antibiotics. -Dr. Louanne Belton spoke with Dr. Candiss Norse ID on 01/19/2022 who recommended repeat blood cultures which have been negative so far. -Continue to monitor off  antibiotics.  4.  Mild transaminitis -Improved.  5.  Acute metabolic encephalopathy  -MRI brain 12/11/2021 with no acute findings with repeat MRI 01/05/2022 negative. -EEG done showed slowing consistent with encephalopathy. -Neurology consulted during the hospitalization has since signed off. -Continue vitamin B6, folic acid.  6.  Vitamin B6 deficiency/folic acid deficiency -Continue supplementation.   7.  Hypomagnesemia/hypokalemia/hypophosphatemia -Patient placed on oral potassium supplementation per primary team.   -Phosphorus at 2.2. -Magnesium at 1.9. -Sodium phosphate 30 mmol IV x1.  8.  Hypercalcemia -Improved. -PTH RP less than 2.  9.  Severe protein calorie malnutrition/failure to thrive/poor oral intake -Primary team recommended PEG tube family, palliative care discussed with family. -Patient seen by ethics committee. -IR consulted for PEG tube placement early on, patient being followed by general surgery and general surgery recommended PEG tube placement which was done on 01/24/2022 on endoscopy.  -Patient started on tube feeds.  -Per primary team.    10.  Goals of care -Palliative care following.    DVT prophylaxis: SCDs Code Status: Full Family Communication: No family at bedside. Disposition:   Status is: Inpatient Remains inpatient appropriate because: Severity of illness           Consultants:  Consult: Dr. Cathlean Sauer 1/30-31/2023 [facility] Palliative care Psychiatry Neurology  Procedures:  Status post exploratory laparotomy with right colectomy and ileostomy on 11/17/2021 PEG tube placement 01/24/2022  Antimicrobials:  Anti-infectives (From admission, onward)    Start     Dose/Rate Route Frequency Ordered Stop   01/01/22 2100  piperacillin-tazobactam (ZOSYN) IVPB 3.375 g  Status:  Discontinued        3.375 g 12.5 mL/hr over  240 Minutes Intravenous Every 8 hours 01/01/22 2042 01/10/22 1333   11/25/21 1000  piperacillin-tazobactam  (ZOSYN) IVPB 3.375 g  Status:  Discontinued        3.375 g 12.5 mL/hr over 240 Minutes Intravenous Every 8 hours 11/25/21 0909 12/01/21 0822   11/17/21 1830  piperacillin-tazobactam (ZOSYN) IVPB 3.375 g        3.375 g 12.5 mL/hr over 240 Minutes Intravenous Every 8 hours 11/17/21 1415 11/22/21 2359   11/17/21 1230  piperacillin-tazobactam (ZOSYN) IVPB 3.375 g        3.375 g 100 mL/hr over 30 Minutes Intravenous  Once 11/17/21 1220 11/17/21 1304         Subjective: Sleeping but arousable.  No chest pain.  No shortness of breath.   Objective: Vitals:   01/24/22 0830 01/24/22 1342 01/24/22 2135 01/25/22 0620  BP: 128/64 129/76 125/76 127/74  Pulse: 83 95 97 94  Resp: 17 18 16 18   Temp:  98.2 F (36.8 C) 97.9 F (36.6 C) 98.2 F (36.8 C)  TempSrc:  Oral Oral   SpO2: 97% 100% 98% 100%  Weight:      Height:        Intake/Output Summary (Last 24 hours) at 01/25/2022 1230 Last data filed at 01/25/2022 1000 Gross per 24 hour  Intake 1943.46 ml  Output 900 ml  Net 1043.46 ml    Filed Weights   12/30/21 0635 01/20/22 0918 01/24/22 0653  Weight: 73.3 kg 79.7 kg 79.7 kg    Examination:  General exam: Frail.  Deconditioned.  Dry mucous membranes. Respiratory system: CTA B anterior lung fields.  No wheezes, no crackles, no rhonchi.  Normal respiratory effort.  Cardiovascular system: Regular rate and rhythm no murmurs rubs or gallops.  No JVD.  No lower extremity edema.  Gastrointestinal system: Abdomen is soft, nontender, nondistended.  Ileostomy in place.  Midline incision. 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  PEG tube in place.  Abdominal binder.  Normal bowel sounds heard. Central nervous system: Sleeping but arousable.. No focal neurological deficits. Extremities: Symmetric 5 x 5 power. Skin: No rashes, lesions or ulcers Psychiatry: Judgement and insight appear poor to fair. Mood & affect appropriate.     Data Reviewed: I have personally reviewed  following labs and imaging studies  CBC: Recent Labs  Lab 01/20/22 0428 01/23/22 0446 01/24/22 0054 01/25/22 0523  WBC 6.0 6.3 5.5 9.5  HGB 8.5* 9.1* 8.0* 9.2*  HCT 28.9* 30.9* 28.0* 31.9*  MCV 88.1 88.3 89.2 88.1  PLT 260 322 283 285     Basic Metabolic Panel: Recent Labs  Lab 01/20/22 0428 01/21/22 0357 01/22/22 0434 01/23/22 0446 01/24/22 0054  NA 140 140 141  --  141  K 3.4* 3.4* 3.5  --  3.2*  CL 110 111 112*  --  112*  CO2 24 26 24   --  25  GLUCOSE 100* 105* 105*  --  106*  BUN 8 5* 6*  --  7*  CREATININE <0.30* 0.31* <0.30*  --  0.34*  CALCIUM 9.5 9.7 10.2  --  9.8  MG 1.6* 1.9 1.8 1.9 1.8  PHOS  --   --   --   --  2.6     GFR: Estimated Creatinine Clearance: 73.8 mL/min (A) (by C-G formula based on SCr of 0.34 mg/dL (L)).  Liver Function Tests: Recent Labs  Lab 01/20/22 0428 01/24/22 0054  AST 17  --   ALT 16  --  ALKPHOS 64  --   BILITOT 0.5  --   PROT 7.5  --   ALBUMIN 1.7* 1.7*     CBG: Recent Labs  Lab 01/24/22 1927 01/25/22 0049 01/25/22 0348 01/25/22 0716 01/25/22 1126  GLUCAP 107* 117* 119* 132* 125*      Recent Results (from the past 240 hour(s))  Urine Culture     Status: Abnormal   Collection Time: 01/16/22  5:44 PM   Specimen: Urine, Clean Catch  Result Value Ref Range Status   Specimen Description   Final    URINE, CLEAN CATCH Performed at [facility], [place] [address] [place], [place] [address]    Special Requests   Final    NONE Performed at [facility], [place] [address], [place], [place] [address]    Culture MULTIPLE SPECIES PRESENT, SUGGEST RECOLLECTION (A)  Final   Report Status 01/18/2022 FINAL  Final  Culture, blood (Routine X 2) w Reflex to ID Panel     Status: None   Collection Time: 01/17/22  6:40 AM   Specimen: BLOOD LEFT HAND  Result Value Ref Range Status   Specimen Description   Final    BLOOD LEFT HAND Performed at [facility]  [address], [place], [place] [address]    Special Requests   Final    BOTTLES DRAWN AEROBIC ONLY Blood Culture results may not be optimal due to an inadequate volume of blood received in culture bottles Performed at [facility] [address], [place], [place] [address]    Culture   Final    NO GROWTH 5 DAYS Performed at [facility], [place] [address], [place], [place] [address]    Report Status 01/22/2022 FINAL  Final  Culture, blood (Routine X 2) w Reflex to ID Panel     Status: Abnormal   Collection Time: 01/17/22  7:06 AM   Specimen: BLOOD  Result Value Ref Range Status   Specimen Description   Final    BLOOD RIGHT WRIST Performed at [facility] [address] [place], [place] [address]    Special Requests   Final    BOTTLES DRAWN AEROBIC ONLY Blood Culture results may not be optimal due to an inadequate volume of blood received in culture bottles Performed at [facility] [address], [place], [place] [address]    Culture  Setup Time   Final    GRAM POSITIVE COCCI IN CLUSTERS AEROBIC BOTTLE ONLY CRITICAL RESULT CALLED TO, READ BACK BY AND VERIFIED WITH: J,GADHIA PHARMD @1050  01/18/22 EB    Culture (A)  Final    STAPHYLOCOCCUS EPIDERMIDIS THE SIGNIFICANCE OF ISOLATING THIS ORGANISM FROM A SINGLE SET OF BLOOD CULTURES WHEN MULTIPLE SETS ARE DRAWN IS UNCERTAIN. PLEASE NOTIFY THE MICROBIOLOGY DEPARTMENT WITHIN ONE WEEK IF SPECIATION AND SENSITIVITIES ARE REQUIRED. Performed at [facility], [place] [address] [place], [place] [address]    Report Status 01/20/2022 FINAL  Final  Blood Culture ID Panel (Reflexed)     Status: Abnormal   Collection Time: 01/17/22  7:06 AM  Result Value Ref Range Status   Enterococcus faecalis NOT DETECTED NOT DETECTED Final   Enterococcus Faecium NOT DETECTED NOT DETECTED Final   Listeria monocytogenes NOT DETECTED NOT DETECTED Final   Staphylococcus species DETECTED (A) NOT  DETECTED Final    Comment: CRITICAL RESULT CALLED TO, READ BACK BY AND VERIFIED WITH: J,GADHIA PHARMD @1050  01/18/22 EB  Staphylococcus aureus (BCID) NOT DETECTED NOT DETECTED Final   Staphylococcus epidermidis DETECTED (A) NOT DETECTED Final    Comment: Methicillin (oxacillin) resistant coagulase negative staphylococcus. Possible blood culture contaminant (unless isolated from more than one blood culture draw or clinical case suggests pathogenicity). No antibiotic treatment is indicated for blood  culture contaminants. CRITICAL RESULT CALLED TO, READ BACK BY AND VERIFIED WITH: J,GADHIA PHARMD @1050  01/18/22 EB    Staphylococcus lugdunensis NOT DETECTED NOT DETECTED Final   Streptococcus species NOT DETECTED NOT DETECTED Final   Streptococcus agalactiae NOT DETECTED NOT DETECTED Final   Streptococcus pneumoniae NOT DETECTED NOT DETECTED Final   Streptococcus pyogenes NOT DETECTED NOT DETECTED Final   A.calcoaceticus-baumannii NOT DETECTED NOT DETECTED Final   Bacteroides fragilis NOT DETECTED NOT DETECTED Final   Enterobacterales NOT DETECTED NOT DETECTED Final   Enterobacter cloacae complex NOT DETECTED NOT DETECTED Final   Escherichia coli NOT DETECTED NOT DETECTED Final   Klebsiella aerogenes NOT DETECTED NOT DETECTED Final   Klebsiella oxytoca NOT DETECTED NOT DETECTED Final   Klebsiella pneumoniae NOT DETECTED NOT DETECTED Final   Proteus species NOT DETECTED NOT DETECTED Final   Salmonella species NOT DETECTED NOT DETECTED Final   Serratia marcescens NOT DETECTED NOT DETECTED Final   Haemophilus influenzae NOT DETECTED NOT DETECTED Final   Neisseria meningitidis NOT DETECTED NOT DETECTED Final   Pseudomonas aeruginosa NOT DETECTED NOT DETECTED Final   Stenotrophomonas maltophilia NOT DETECTED NOT DETECTED Final   Candida albicans NOT DETECTED NOT DETECTED Final   Candida auris NOT DETECTED NOT DETECTED Final   Candida glabrata NOT DETECTED NOT DETECTED Final   Candida krusei  NOT DETECTED NOT DETECTED Final   Candida parapsilosis NOT DETECTED NOT DETECTED Final   Candida tropicalis NOT DETECTED NOT DETECTED Final   Cryptococcus neoformans/gattii NOT DETECTED NOT DETECTED Final   Methicillin resistance mecA/C DETECTED (A) NOT DETECTED Final    Comment: CRITICAL RESULT CALLED TO, READ BACK BY AND VERIFIED WITH: J,GADHIA PHARMD @1050  01/18/22 EB Performed at [facility], [address] [place], [place] [address]   Culture, blood (routine x 2)     Status: Abnormal   Collection Time: 01/19/22 11:27 AM   Specimen: BLOOD  Result Value Ref Range Status   Specimen Description   Final    BLOOD BLOOD RIGHT HAND Performed at [facility] [address], [place], [place] [address]    Special Requests   Final    BOTTLES DRAWN AEROBIC ONLY Blood Culture adequate volume Performed at [facility] [address] [place], [place] [address]    Culture  Setup Time   Final    GRAM POSITIVE COCCI IN CLUSTERS AEROBIC BOTTLE ONLY CRITICAL VALUE NOTED.  VALUE IS CONSISTENT WITH PREVIOUSLY REPORTED AND CALLED VALUE. Performed at [facility], [place] [address], [place], [place] [address]    Culture STAPHYLOCOCCUS EPIDERMIDIS (A)  Final   Report Status 01/22/2022 FINAL  Final   Organism ID, Bacteria STAPHYLOCOCCUS EPIDERMIDIS  Final      Susceptibility   Staphylococcus epidermidis - MIC*    CIPROFLOXACIN >=8 RESISTANT Resistant     ERYTHROMYCIN >=8 RESISTANT Resistant     GENTAMICIN <=0.5 SENSITIVE Sensitive     OXACILLIN >=4 RESISTANT Resistant     TETRACYCLINE 2 SENSITIVE Sensitive     VANCOMYCIN 1 SENSITIVE Sensitive     TRIMETH/SULFA 80 RESISTANT Resistant     CLINDAMYCIN >=8 RESISTANT Resistant     RIFAMPIN <=0.5 SENSITIVE  Sensitive     Inducible Clindamycin NEGATIVE Sensitive     * STAPHYLOCOCCUS EPIDERMIDIS  Culture, blood (routine x 2)     Status: None   Collection Time: 01/19/22 11:33 AM   Specimen: BLOOD   Result Value Ref Range Status   Specimen Description   Final    BLOOD BLOOD LEFT HAND Performed at [facility] [address] [place], [place] [address]    Special Requests   Final    BOTTLES DRAWN AEROBIC ONLY Blood Culture results may not be optimal due to an inadequate volume of blood received in culture bottles Performed at [facility] [address] [place], [place] [address]    Culture   Final    NO GROWTH 5 DAYS Performed at [facility], [place] [address], [place], [place] [address]    Report Status 01/24/2022 FINAL  Final  Urine Culture     Status: Abnormal   Collection Time: 01/20/22 12:41 PM   Specimen: In/Out Cath Urine  Result Value Ref Range Status   Specimen Description   Final    IN/OUT CATH URINE Performed at [facility] [address] [place], [place] [address]    Special Requests   Final    NONE Performed at [facility], [place] [address] [place], [place] [address]    Culture MULTIPLE SPECIES PRESENT, SUGGEST RECOLLECTION (A)  Final   Report Status 01/21/2022 FINAL  Final          Radiology Studies: No results found.      Scheduled Meds:  chlorhexidine  15 mL Mouth Rinse BID   ferrous sulfate  220 mg Per Tube BID WC   folic acid  1 mg Per Tube Daily   insulin aspart  0-9 Units Subcutaneous Q4H   mouth rinse  15 mL Mouth Rinse q12n4p   multivitamin with minerals  1 tablet Per Tube Daily   niacin  100 mg Per Tube QHS   pantoprazole sodium  40 mg Per Tube Daily   potassium chloride  20 mEq Per Tube BID   psyllium  1 packet Oral BID   vitamin B-6  100 mg Per Tube Daily   thiamine  100 mg Per Tube BID   Continuous Infusions:  sodium chloride 10 mL/hr at 01/25/22 0958   feeding supplement (OSMOLITE 1.5 CAL)     heparin 1,650 Units/hr (01/25/22 0053)   lactated ringers       LOS: 69 days    Time spent: 35 minutes    Irine Seal, MD [facility]   To contact the attending provider between 7A-7P or the covering provider during after hours 7P-7A, please log into the web site [email or web address] and access using universal [facility] password for that web site. If you do not have the password, please call the hospital operator.  01/25/2022, 12:30 PM

## 2022-01-25 NOTE — Progress Notes (Signed)
ANTICOAGULATION CONSULT NOTE  Pharmacy Consult for Heparin Indication: Bilateral femoral DVTs  No Known Allergies  Patient Measurements: Height: 5\' 10"  (177.8 cm) Weight: 79.7 kg (175 lb 11.3 oz) IBW/kg (Calculated) : 68.5 Heparin Dosing Weight: total body weight  Vital Signs: Temp: 98.2 F (36.8 C) (02/04 0620) BP: 127/74 (02/04 0620) Pulse Rate: 94 (02/04 0620)  Labs: Recent Labs    01/23/22 0446 01/23/22 0707 01/24/22 0054 01/24/22 1855 01/24/22 2046 01/25/22 0523 01/25/22 1211  HGB 9.1*  --  8.0*  --   --  9.2*  --   HCT 30.9*  --  28.0*  --   --  31.9*  --   PLT 322  --  283  --   --  285  --   HEPARINUNFRC  --    < > 0.43   < > 0.25* 0.37 0.19*  CREATININE  --   --  0.34*  --   --   --   --    < > = values in this interval not displayed.     Estimated Creatinine Clearance: 73.8 mL/min (A) (by C-G formula based on SCr of 0.34 mg/dL (L)).   Medical History: Past Medical History:  Diagnosis Date   Anemia    past hx    Arthritis    wrists , knees,  feet    Cataract    cordical cataracts per pt    Constipation    takes a stool softener  daily- as long as does this has daily soft stools    GERD (gastroesophageal reflux disease)    Hemorrhoids    Hyperlipidemia     Assessment: 66 yo female admitted 11/17/21 with 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 On 01/22/22, found to have bilateral femoral DVTs on CT scan.  Pharmacy consulted to dose IV heparin; has been receiving lovenox 40mg  q24h - last dose 01/22/22 at 0720.   01/25/22:  Heparin level again low on 1650 units/hr - seems to get therapeutic with each rate increase, then fall low again with subsequent heparin level CBC: Hg low/stable, pltc WNL No bleeding noted per d/w RN Heparin infusing at correct rate without issues per RN  Goal of Therapy:  Heparin level 0.3-0.7 units/ml Monitor platelets by anticoagulation protocol: Yes   Plan:  Increase heparin drip to 2000 units/hr  Check  confirmatory HL in 8 hours  Daily heparin level & CBC while on heparin F/u ability to switch to oral anticoagulation    Reuel Boom, PharmD, BCPS [phone] 01/25/2022, 12:47 PM

## 2022-01-25 NOTE — Progress Notes (Signed)
ANTICOAGULATION CONSULT NOTE  Pharmacy Consult for Heparin Indication: Bilateral femoral DVTs  No Known Allergies  Patient Measurements: Height: 5\' 10"  (177.8 cm) Weight: 79.7 kg (175 lb 11.3 oz) IBW/kg (Calculated) : 68.5 Heparin Dosing Weight: total body weight  Vital Signs: Temp: 99.4 F (37.4 C) (02/04 2103) Temp Source: Axillary (02/04 2103) BP: 123/67 (02/04 2103) Pulse Rate: 94 (02/04 2103)  Labs: Recent Labs    01/23/22 0446 01/23/22 0707 01/24/22 0054 01/24/22 1855 01/25/22 0523 01/25/22 1211 01/25/22 2236  HGB 9.1*  --  8.0*  --  9.2*  --   --   HCT 30.9*  --  28.0*  --  31.9*  --   --   PLT 322  --  283  --  285  --   --   HEPARINUNFRC  --    < > 0.43   < > 0.37 0.19* 0.79*  CREATININE  --   --  0.34*  --   --   --   --    < > = values in this interval not displayed.     Estimated Creatinine Clearance: 73.8 mL/min (A) (by C-G formula based on SCr of 0.34 mg/dL (L)).   Medical History: Past Medical History:  Diagnosis Date   Anemia    past hx    Arthritis    wrists , knees,  feet    Cataract    cordical cataracts per pt    Constipation    takes a stool softener  daily- as long as does this has daily soft stools    GERD (gastroesophageal reflux disease)    Hemorrhoids    Hyperlipidemia     Assessment: 66 yo female admitted 11/17/21 with 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 On 01/22/22, found to have bilateral femoral DVTs on CT scan.  Pharmacy consulted to dose IV heparin; has been receiving lovenox 40mg  q24h - last dose 01/22/22 at 0720.   01/25/22:  Heparin level now supra-therapeutic on 2000 units/hr - seems to get therapeutic with each rate increase, then fall low again with subsequent heparin level CBC: Hg low/stable, pltc WNL No bleeding noted per d/w RN Heparin infusing at correct rate without issues per RN Verified lab sample obtained from opposite arm (infusing in right forearm; lab drawn from left arm per RN  Goal of  Therapy:  Heparin level 0.3-0.7 units/ml Monitor platelets by anticoagulation protocol: Yes   Plan:  Decrease heparin drip to 1800 units/hr  Check HL in 8 hours  Daily heparin level & CBC while on heparin F/u ability to switch to oral anticoagulation    Netta Cedars, PharmD, BCPS [phone] 01/25/2022, 11:07 PM

## 2022-01-25 NOTE — Progress Notes (Signed)
ANTICOAGULATION CONSULT NOTE  Pharmacy Consult for Heparin Indication: Bilateral femoral DVTs  No Known Allergies  Patient Measurements: Height: 5\' 10"  (177.8 cm) Weight: 79.7 kg (175 lb 11.3 oz) IBW/kg (Calculated) : 68.5 Heparin Dosing Weight: total body weight  Vital Signs: Temp: 97.9 F (36.6 C) (02/03 2135) Temp Source: Oral (02/03 2135) BP: 125/76 (02/03 2135) Pulse Rate: 97 (02/03 2135)  Labs: Recent Labs    01/23/22 0446 01/23/22 0707 01/24/22 0054 01/24/22 1855 01/24/22 2046 01/25/22 0523  HGB 9.1*  --  8.0*  --   --  9.2*  HCT 30.9*  --  28.0*  --   --  31.9*  PLT 322  --  283  --   --  285  HEPARINUNFRC  --    < > 0.43 <0.10* 0.25* 0.37  CREATININE  --   --  0.34*  --   --   --    < > = values in this interval not displayed.     Estimated Creatinine Clearance: 73.8 mL/min (A) (by C-G formula based on SCr of 0.34 mg/dL (L)).   Medical History: Past Medical History:  Diagnosis Date   Anemia    past hx    Arthritis    wrists , knees,  feet    Cataract    cordical cataracts per pt    Constipation    takes a stool softener  daily- as long as does this has daily soft stools    GERD (gastroesophageal reflux disease)    Hemorrhoids    Hyperlipidemia     Assessment: 66 yo female admitted 11/17/21 with 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 On 01/22/22, found to have bilateral femoral DVTs on CT scan.  Pharmacy consulted to dose IV heparin; has been receiving lovenox 40mg  q24h - last dose 01/22/22 at 0720.   01/25/22:  Heparin level 0.37- therapeutic on IV heparin at 1650 units/hr  CBC: Hg 9.2- low/stable, pltc WNL No bleeding noted per d/w RN Heparin infusing at correct rate without issues per RN  Goal of Therapy:  Heparin level 0.3-0.7 units/ml Monitor platelets by anticoagulation protocol: Yes   Plan:  Continue heparin drip at 1650 units/hr  Check confirmatory HL in 6 hours  Daily heparin level & CBC while on heparin F/u ability  to switch to oral anticoagulation   Netta Cedars, PharmD, BCPS Pharmacy: [phone] 01/25/2022 6:09 AM

## 2022-01-25 NOTE — Progress Notes (Signed)
Sherry Hampton [id number] 1956/07/14  CARE TEAM:  PCP: Pcp, No  Outpatient Care Team: Patient Care Team: Pcp, No as PCP - General  Inpatient Treatment Team: Treatment Team: Attending Provider: Edison Pace, Md, MD; [facility] Nurse: Nadara Mode, RN; Nurse Practitioner: Jimmy Footman, NP; Registered Nurse: Ginette Otto, RN; Consulting Physician: Samuella Cota, MD; Rounding Team: Fatima Blank, MD; Technician: Rutherford Nail, NT; Utilization Review: Claudie Leach, RN; Registered Nurse: Tanda Rockers, RN   Problem List:   Principal Problem:   Perforated abdominal viscus Active Problems:   GERD (gastroesophageal reflux disease)   Hyperlipidemia   Hyponatremia   Acute blood loss anemia   Acute metabolic encephalopathy   Protein-calorie malnutrition, severe   Delirium   Necrosis of colon with perforation    Ileostomy in place [facility])   Abnormal neurological exam   Memory loss   Hypercalcemia   Folate deficiency   Common bile duct dilatation   Enterococcus UTI   Goals of care, counseling/discussion   Vitamin B6 deficiency  11/17/2021   Post-operative Diagnosis:  perforated ascending colon with fecal peritonitis   Surgeon:  [name], MD   Procedure:  Exploratory laparotomy, extended right colectomy, end ileostomy [name]), and mucous fistula (distal transverse colon)  1 Day Post-Op    Procedure:               UGI Endoscopy with Peg Providers:                Felicie Morn, MD    Assessment  Stable  [facility] Stay = 69 days)  Assessment/Plan 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 Harlow Asa 11/27 - path: necrotic colon at site of perforation, no malignancy or dysplasia noted  - Pathology benign. CT distal descending/proximal sigmoid bowel luminal collapse. - CT 1/11 - some extravasated oral contrast in the subcutaneous abdominal wall superior and  lateral to the ostomy at this level. There is inflammatory tissue at the level of the ostomy extending superiorly abutting the inferior tip of the liver and fundus of the gallbladder similar to the prior examination. There is likely some early fluid collection development anterior to the right kidney image 7/31 measuring 1.9 by 1.4 by 3.7 cm. Previously described fluid collections near the ostomy are not as well defined on the current study. 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 There is no free intraperitoneal air or ascites. Small air-fluid collection at the level of the umbilicus is again seen containing air. This collection measures 2.5 x 0.7 by 1.3 cm and has mildly increased in size. Air in the collection is new from prior. - no collection around the ileostomy that is drainable - the best way to try to resolve this is abx and improving nutrition - zosyn completed for last CT scan findings and UTI on 1/20 - Dr. Hassell Done stopped her TNA on 1/17 - repeat CT 2/1: resolution in RLQ stranding and resolving hematoma/seroma,   - Ileostomy output improved. Continue BID metamucil & iron.  - WOC following for ileostomy, mucous fistula - Wound improved with healthy granulation tissue present, no tracts or purulent drainage present on dressing change this AM. Daily WTD dressing   FEN  -Severe malnutrition given feculent peritonitis and deconditioned patient with prolonged stay -weaned off TNA parenteral nutrition 1/17 since digestive tract working. - Enteral  nutrition.   -OK for Dysphagia 2 diet - but will not eat despite efforts by nursing and family  -palliative saw family & they agreed with a feeding tube since they feel  that is what her sister would want. Family consented to PEG placement late 2/2,   -PEG placed 2/3.   -Transitioned to on enteral meds via gastrostomy tube 2/3-    -Starting  tube feeds.  Plan to go slowly with gradual advancement of rate over the next few days.  Then possibly can switch to bolus feeds.    VTE - Bilateral femoral DVTs noted on repeat CT AP 2/1,  - on heparin gtt per medicine -Tentative plan to transition to DOAC once up to goal on TF    ID  - fever curve improved,  - -UTI -  Urine Cx 1/10 with enterococcus faecalis sensitive to ampicillin, Zosyn completed. Urine Cx ordered 1/26 with multiple species, reordered 1/30.  urine cx with multiple species on repeat cx as well - no foley - blood cx 1/27 - with staph epi  1/2 - thought to be contaminate and repeat blood cxs sent 1/29 with the same - zosyn 11/27>12/2, 12/5>12/11, restarted 1/11> 1/20  -off Abx 1/20-   Foley - None currently.  Nursing using perineal wick   - encephalopathy/FTT -   On folic acid and thiamine supplementation.  Appreciate TRH following as well. Psychiatry saw 12/27 and determined patient does not have capacity   Dispo: unclear at this time.  Possibly transition out of hospital when meets goals of tube feeds and has full enteral nutrition.   LOS: 68 days    I reviewed last 24 h vitals and pain scores, last 48 h intake and output, and last 24 h labs and trends.   This care required moderate level of medical decision making.           35 minutes spent in review, evaluation, examination, counseling, and coordination of care.   I have reviewed this patient's available data, including medical history, events of note, physical examination and test results as part of my evaluation.  A significant portion of that time was spent in counseling.  Care during the described time interval was provided by me.  01/25/2022    Subjective: (Chief complaint)  Patient resting in bed.  There  No major events.  Tube feeds via PEG Gtube started at low 15 mL hour rate.  Objective:  Vital signs:  Vitals:   01/24/22 0830 01/24/22 1342 01/24/22 2135 01/25/22 0620  BP: 128/64  129/76 125/76 127/74  Pulse: 83 95 97 94  Resp: 17 18 16 18   Temp:  98.2 F (36.8 C) 97.9 F (36.6 C) 98.2 F (36.8 C)  TempSrc:  Oral Oral   SpO2: 97% 100% 98% 100%  Weight:      Height:        Last BM Date: 01/24/22  Intake/Output   Yesterday:  02/03 0701 - 02/04 0700 In: 2656.9 [P.O.:480; I.V.:1881.4; NG/GT:265.5] Out: 600 [Urine:350; Stool:250] This shift:  No intake/output data recorded.  Bowel function:  Flatus: YES  BM:  YES  Drain: (No drain)   Physical Exam:  General: Pt resting but does awaken in no acute distress Eyes: PERRL, normal EOM.  Sclera clear.  No icterus Neuro: CN II-XII intact w/o focal sensory/motor deficits. Lymph: No head/neck/groin lymphadenopathy Psych: Still with baseline encephalopathy/confusion but no agitation Oriented x 1 HENT: Normocephalic, Mucus membranes moist.  No thrush Neck: Supple, No tracheal deviation.  No obvious thyromegaly Chest: No pain to chest wall compression.  Good respiratory excursion.  No audible wheezing CV:  Pulses intact.  Regular rhythm.  No major extremity edema MS: Normal AROM mjr joints.  No obvious deformity  Abdomen: Soft.  Mildy distended.  Right-sided ileostomy pink with flatus and oatmeal thick succus/effluent.  Left-sided: Mucous fistula pink with scant output.  Midline incisional wound with some poor granulation but clean.  No evidence of peritonitis.  No incarcerated hernias.  Ext:   No deformity.  No mjr edema.  No cyanosis Skin: No petechiae / purpurea.  No major sores.  Warm and dry    Results:   Cultures: Recent Results (from the past 720 hour(s))  Urine Culture     Status: Abnormal   Collection Time: 12/31/21 10:48 AM   Specimen: In/Out Cath Urine  Result Value Ref Range Status   Specimen Description   Final    IN/OUT CATH URINE Performed at [facility], [place] [address], [place], [place] [address]    Special Requests   Final    NONE Performed at [facility], [place] [address], [place], [place] [address]    Culture >=100,000 COLONIES/mL ENTEROCOCCUS FAECALIS (A)  Final   Report Status 01/02/2022 FINAL  Final   Organism ID, Bacteria ENTEROCOCCUS FAECALIS (A)  Final      Susceptibility   Enterococcus faecalis - MIC*    AMPICILLIN <=2 SENSITIVE Sensitive     NITROFURANTOIN <=16 SENSITIVE Sensitive     VANCOMYCIN 1 SENSITIVE Sensitive     * >=100,000 COLONIES/mL ENTEROCOCCUS FAECALIS  Urine Culture     Status: Abnormal   Collection Time: 01/16/22  5:44 PM   Specimen: Urine, Clean Catch  Result Value Ref Range Status   Specimen Description   Final    URINE, CLEAN CATCH Performed at [facility], [place] [address] [place], [place] [address]    Special Requests   Final    NONE Performed at [facility], [place] [address], [place], [place] [address]    Culture MULTIPLE SPECIES PRESENT, SUGGEST RECOLLECTION (A)  Final   Report Status 01/18/2022 FINAL  Final  Culture, blood (Routine X 2) w Reflex to ID Panel     Status: None   Collection Time: 01/17/22  6:40 AM   Specimen: BLOOD LEFT HAND  Result Value Ref Range Status   Specimen Description   Final    BLOOD LEFT HAND Performed at [place] [address] [place], [place] [address]    Special Requests   Final    BOTTLES DRAWN AEROBIC ONLY Blood Culture results may not be optimal due to an inadequate volume of blood received in culture bottles Performed at [place] [address] [place], [place] [address]    Culture   Final    NO GROWTH 5 DAYS Performed at [facility], [place] [address], [place], [place] [address]    Report Status 01/22/2022 FINAL  Final  Culture, blood (Routine X 2) w Reflex to ID Panel     Status: Abnormal   Collection Time: 01/17/22  7:06 AM   Specimen: BLOOD  Result Value Ref Range Status   Specimen Description   Final    BLOOD RIGHT WRIST Performed at [place] [address] [place],  [address]    Special Requests   Final    BOTTLES DRAWN AEROBIC ONLY Blood Culture results  may not be optimal due to an inadequate volume of blood received in culture bottles Performed at [place] [address] [place], [place] [address]    Culture  Setup Time   Final    GRAM POSITIVE COCCI IN CLUSTERS AEROBIC BOTTLE ONLY CRITICAL RESULT CALLED TO, READ BACK BY AND VERIFIED WITH: J,GADHIA PHARMD @1050  01/18/22 EB    Culture (A)  Final    STAPHYLOCOCCUS EPIDERMIDIS THE SIGNIFICANCE OF ISOLATING THIS ORGANISM FROM A SINGLE SET OF BLOOD CULTURES WHEN MULTIPLE SETS ARE DRAWN IS UNCERTAIN. PLEASE NOTIFY THE MICROBIOLOGY DEPARTMENT WITHIN ONE WEEK IF SPECIATION AND SENSITIVITIES ARE REQUIRED. Performed at [facility], [place] [address], [place], [place] [address]    Report Status 01/20/2022 FINAL  Final  Blood Culture ID Panel (Reflexed)     Status: Abnormal   Collection Time: 01/17/22  7:06 AM  Result Value Ref Range Status   Enterococcus faecalis NOT DETECTED NOT DETECTED Final   Enterococcus Faecium NOT DETECTED NOT DETECTED Final   Listeria monocytogenes NOT DETECTED NOT DETECTED Final   Staphylococcus species DETECTED (A) NOT DETECTED Final    Comment: CRITICAL RESULT CALLED TO, READ BACK BY AND VERIFIED WITH: J,GADHIA PHARMD @1050  01/18/22 EB    Staphylococcus aureus (BCID) NOT DETECTED NOT DETECTED Final   Staphylococcus epidermidis DETECTED (A) NOT DETECTED Final    Comment: Methicillin (oxacillin) resistant coagulase negative staphylococcus. Possible blood culture contaminant (unless isolated from more than one blood culture draw or clinical case suggests pathogenicity). No antibiotic treatment is indicated for blood  culture contaminants. CRITICAL RESULT CALLED TO, READ BACK BY AND VERIFIED WITH: J,GADHIA PHARMD @1050  01/18/22 EB    Staphylococcus lugdunensis NOT DETECTED NOT DETECTED Final    Streptococcus species NOT DETECTED NOT DETECTED Final   Streptococcus agalactiae NOT DETECTED NOT DETECTED Final   Streptococcus pneumoniae NOT DETECTED NOT DETECTED Final   Streptococcus pyogenes NOT DETECTED NOT DETECTED Final   A.calcoaceticus-baumannii NOT DETECTED NOT DETECTED Final   Bacteroides fragilis NOT DETECTED NOT DETECTED Final   Enterobacterales NOT DETECTED NOT DETECTED Final   Enterobacter cloacae complex NOT DETECTED NOT DETECTED Final   Escherichia coli NOT DETECTED NOT DETECTED Final   Klebsiella aerogenes NOT DETECTED NOT DETECTED Final   Klebsiella oxytoca NOT DETECTED NOT DETECTED Final   Klebsiella pneumoniae NOT DETECTED NOT DETECTED Final   Proteus species NOT DETECTED NOT DETECTED Final   Salmonella species NOT DETECTED NOT DETECTED Final   Serratia marcescens NOT DETECTED NOT DETECTED Final   Haemophilus influenzae NOT DETECTED NOT DETECTED Final   Neisseria meningitidis NOT DETECTED NOT DETECTED Final   Pseudomonas aeruginosa NOT DETECTED NOT DETECTED Final   Stenotrophomonas maltophilia NOT DETECTED NOT DETECTED Final   Candida albicans NOT DETECTED NOT DETECTED Final   Candida auris NOT DETECTED NOT DETECTED Final   Candida glabrata NOT DETECTED NOT DETECTED Final   Candida krusei NOT DETECTED NOT DETECTED Final   Candida parapsilosis NOT DETECTED NOT DETECTED Final   Candida tropicalis NOT DETECTED NOT DETECTED Final   Cryptococcus neoformans/gattii NOT DETECTED NOT DETECTED Final   Methicillin resistance mecA/C DETECTED (A) NOT DETECTED Final    Comment: CRITICAL RESULT CALLED TO, READ BACK BY AND VERIFIED WITH: J,GADHIA PHARMD @1050  01/18/22 EB Performed at [facility], [address], [place], [place] [address]   Culture, blood (routine x 2)     Status: Abnormal   Collection Time: 01/19/22 11:27 AM   Specimen: BLOOD  Result Value Ref  Range Status   Specimen Description   Final    BLOOD BLOOD RIGHT HAND Performed at [place] [address], [place], [place] [address]    Special Requests   Final    BOTTLES DRAWN AEROBIC ONLY Blood Culture adequate volume Performed at [place] [address], [place], [place] [address]    Culture  Setup Time   Final    GRAM POSITIVE COCCI IN CLUSTERS AEROBIC BOTTLE ONLY CRITICAL VALUE NOTED.  VALUE IS CONSISTENT WITH PREVIOUSLY REPORTED AND CALLED VALUE. Performed at [facility], [place] [address], [place], [place] [address]    Culture STAPHYLOCOCCUS EPIDERMIDIS (A)  Final   Report Status 01/22/2022 FINAL  Final   Organism ID, Bacteria STAPHYLOCOCCUS EPIDERMIDIS  Final      Susceptibility   Staphylococcus epidermidis - MIC*    CIPROFLOXACIN >=8 RESISTANT Resistant     ERYTHROMYCIN >=8 RESISTANT Resistant     GENTAMICIN <=0.5 SENSITIVE Sensitive     OXACILLIN >=4 RESISTANT Resistant     TETRACYCLINE 2 SENSITIVE Sensitive     VANCOMYCIN 1 SENSITIVE Sensitive     TRIMETH/SULFA 80 RESISTANT Resistant     CLINDAMYCIN >=8 RESISTANT Resistant     RIFAMPIN <=0.5 SENSITIVE Sensitive     Inducible Clindamycin NEGATIVE Sensitive     * STAPHYLOCOCCUS EPIDERMIDIS  Culture, blood (routine x 2)     Status: None   Collection Time: 01/19/22 11:33 AM   Specimen: BLOOD  Result Value Ref Range Status   Specimen Description   Final    BLOOD BLOOD LEFT HAND Performed at [place] [address], [place], [place] [address]    Special Requests   Final    BOTTLES DRAWN AEROBIC ONLY Blood Culture results may not be optimal due to an inadequate volume of blood received in culture bottles Performed at [place] [address], [place], [place] [address]    Culture   Final    NO GROWTH 5 DAYS Performed at [facility], [place] [address] [place], [place] [address]    Report Status 01/24/2022 FINAL  Final  Urine Culture     Status: Abnormal   Collection Time: 01/20/22 12:41 PM   Specimen: In/Out Cath  Urine  Result Value Ref Range Status   Specimen Description   Final    IN/OUT CATH URINE Performed at [place] [address], [place], [place] [address]    Special Requests   Final    NONE Performed at [facility], [place] [address], [place], [place] [address]    Culture MULTIPLE SPECIES PRESENT, SUGGEST RECOLLECTION (A)  Final   Report Status 01/21/2022 FINAL  Final    Labs: Results for orders placed or performed during the hospital encounter of 11/17/21 (from the past 48 hour(s))  Heparin level (unfractionated)     Status: Abnormal   Collection Time: 01/23/22  5:26 PM  Result Value Ref Range   Heparin Unfractionated 0.25 (L) 0.30 - 0.70 IU/mL    Comment: (NOTE) The clinical reportable range upper limit is being lowered to >1.10 to align with the FDA approved guidance for the current laboratory assay.  If heparin results are below expected values, and patient dosage has  been confirmed, suggest follow up testing of antithrombin III levels. Performed at [facility], [place] [address] [place], [place] [address]   CBC     Status: Abnormal  Collection Time: 01/24/22 12:54 AM  Result Value Ref Range   WBC 5.5 4.0 - 10.5 K/uL   RBC 3.14 (L) 3.87 - 5.11 MIL/uL   Hemoglobin 8.0 (L) 12.0 - 15.0 g/dL   HCT 28.0 (L) 36.0 - 46.0 %   MCV 89.2 80.0 - 100.0 fL   MCH 25.5 (L) 26.0 - 34.0 pg   MCHC 28.6 (L) 30.0 - 36.0 g/dL   RDW 18.3 (H) 11.5 - 15.5 %   Platelets 283 150 - 400 K/uL   nRBC 0.0 0.0 - 0.2 %    Comment: Performed at [facility], [place] [address], [place], [place] [address]  Renal function panel     Status: Abnormal   Collection Time: 01/24/22 12:54 AM  Result Value Ref Range   Sodium 141 135 - 145 mmol/L   Potassium 3.2 (L) 3.5 - 5.1 mmol/L   Chloride 112 (H) 98 - 111 mmol/L   CO2 25 22 - 32 mmol/L   Glucose, Bld 106 (H) 70 - 99 mg/dL    Comment: Glucose reference range applies only to  samples taken after fasting for at least 8 hours.   BUN 7 (L) 8 - 23 mg/dL   Creatinine, Ser 0.34 (L) 0.44 - 1.00 mg/dL   Calcium 9.8 8.9 - 10.3 mg/dL   Phosphorus 2.6 2.5 - 4.6 mg/dL   Albumin 1.7 (L) 3.5 - 5.0 g/dL   GFR, Estimated >60 >60 mL/min    Comment: (NOTE) Calculated using the CKD-EPI Creatinine Equation (2021)    Anion gap 4 (L) 5 - 15    Comment: Performed at [facility], [place] [address] [place], [place] [address]  Heparin level (unfractionated)     Status: None   Collection Time: 01/24/22 12:54 AM  Result Value Ref Range   Heparin Unfractionated 0.43 0.30 - 0.70 IU/mL    Comment: (NOTE) The clinical reportable range upper limit is being lowered to >1.10 to align with the FDA approved guidance for the current laboratory assay.  If heparin results are below expected values, and patient dosage has  been confirmed, suggest follow up testing of antithrombin III levels. Performed at [facility], [place] [address], [place], [place] [address]   Magnesium     Status: None   Collection Time: 01/24/22 12:54 AM  Result Value Ref Range   Magnesium 1.8 1.7 - 2.4 mg/dL    Comment: Performed at [facility], [place] [address], [place], [place] [address]  Hemoglobin A1c     Status: Abnormal   Collection Time: 01/24/22 12:54 AM  Result Value Ref Range   Hgb A1c MFr Bld 4.6 (L) 4.8 - 5.6 %    Comment: (NOTE) Pre diabetes:          5.7%-6.4%  Diabetes:              >6.4%  Glycemic control for   <7.0% adults with diabetes    Mean Plasma Glucose 85.32 mg/dL    Comment: Performed at [place] [address], [place], [place] [address]  Glucose, capillary     Status: None   Collection Time: 01/24/22 11:38 AM  Result Value Ref Range   Glucose-Capillary 96 70 - 99 mg/dL    Comment: Glucose reference range applies only to samples taken after fasting for at least 8 hours.  Glucose, capillary     Status: Abnormal    Collection Time: 01/24/22  4:26 PM  Result Value  Ref Range   Glucose-Capillary 104 (H) 70 - 99 mg/dL    Comment: Glucose reference range applies only to samples taken after fasting for at least 8 hours.  Heparin level (unfractionated)     Status: Abnormal   Collection Time: 01/24/22  6:55 PM  Result Value Ref Range   Heparin Unfractionated <0.10 (L) 0.30 - 0.70 IU/mL    Comment: (NOTE) The clinical reportable range upper limit is being lowered to >1.10 to align with the FDA approved guidance for the current laboratory assay.  If heparin results are below expected values, and patient dosage has  been confirmed, suggest follow up testing of antithrombin III levels. Performed at [facility], [place] [address] [place], [place] [address]   Glucose, capillary     Status: Abnormal   Collection Time: 01/24/22  7:27 PM  Result Value Ref Range   Glucose-Capillary 107 (H) 70 - 99 mg/dL    Comment: Glucose reference range applies only to samples taken after fasting for at least 8 hours.  Heparin level (unfractionated)     Status: Abnormal   Collection Time: 01/24/22  8:46 PM  Result Value Ref Range   Heparin Unfractionated 0.25 (L) 0.30 - 0.70 IU/mL    Comment: (NOTE) The clinical reportable range upper limit is being lowered to >1.10 to align with the FDA approved guidance for the current laboratory assay.  If heparin results are below expected values, and patient dosage has  been confirmed, suggest follow up testing of antithrombin III levels. Performed at [facility], [place] [address] [place], [place] [address]   Glucose, capillary     Status: Abnormal   Collection Time: 01/25/22 12:49 AM  Result Value Ref Range   Glucose-Capillary 117 (H) 70 - 99 mg/dL    Comment: Glucose reference range applies only to samples taken after fasting for at least 8 hours.  Glucose, capillary     Status: Abnormal   Collection Time: 01/25/22  3:48 AM  Result Value  Ref Range   Glucose-Capillary 119 (H) 70 - 99 mg/dL    Comment: Glucose reference range applies only to samples taken after fasting for at least 8 hours.  CBC     Status: Abnormal   Collection Time: 01/25/22  5:23 AM  Result Value Ref Range   WBC 9.5 4.0 - 10.5 K/uL   RBC 3.62 (L) 3.87 - 5.11 MIL/uL   Hemoglobin 9.2 (L) 12.0 - 15.0 g/dL   HCT 31.9 (L) 36.0 - 46.0 %   MCV 88.1 80.0 - 100.0 fL   MCH 25.4 (L) 26.0 - 34.0 pg   MCHC 28.8 (L) 30.0 - 36.0 g/dL   RDW 18.1 (H) 11.5 - 15.5 %   Platelets 285 150 - 400 K/uL   nRBC 0.0 0.0 - 0.2 %    Comment: Performed at [facility], [place] [address], [place], [place] [address]  Heparin level (unfractionated)     Status: None   Collection Time: 01/25/22  5:23 AM  Result Value Ref Range   Heparin Unfractionated 0.37 0.30 - 0.70 IU/mL    Comment: (NOTE) The clinical reportable range upper limit is being lowered to >1.10 to align with the FDA approved guidance for the current laboratory assay.  If heparin results are below expected values, and patient dosage has  been confirmed, suggest follow up testing of antithrombin III levels. Performed at [facility], [place] [address] [place], [place] [address]   Glucose, capillary  Status: Abnormal   Collection Time: 01/25/22  7:16 AM  Result Value Ref Range   Glucose-Capillary 132 (H) 70 - 99 mg/dL    Comment: Glucose reference range applies only to samples taken after fasting for at least 8 hours.    Imaging / Studies: No results found.  Medications / Allergies: per chart  Antibiotics: Anti-infectives (From admission, onward)    Start     Dose/Rate Route Frequency Ordered Stop   01/01/22 2100  piperacillin-tazobactam (ZOSYN) IVPB 3.375 g  Status:  Discontinued        3.375 g 12.5 mL/hr over 240 Minutes Intravenous Every 8 hours 01/01/22 2042 01/10/22 1333   11/25/21 1000  piperacillin-tazobactam (ZOSYN) IVPB 3.375 g  Status:  Discontinued         3.375 g 12.5 mL/hr over 240 Minutes Intravenous Every 8 hours 11/25/21 0909 12/01/21 0822   11/17/21 1830  piperacillin-tazobactam (ZOSYN) IVPB 3.375 g        3.375 g 12.5 mL/hr over 240 Minutes Intravenous Every 8 hours 11/17/21 1415 11/22/21 2359   11/17/21 1230  piperacillin-tazobactam (ZOSYN) IVPB 3.375 g        3.375 g 100 mL/hr over 30 Minutes Intravenous  Once 11/17/21 1220 11/17/21 1304         Note: Portions of this report may have been transcribed using voice recognition software. Every effort was made to ensure accuracy; however, inadvertent computerized transcription errors may be present.   Any transcriptional errors that result from this process are unintentional.    Adin Hector, MD, FACS, MASCRS Esophageal, Gastrointestinal & Colorectal Surgery Robotic and Minimally Invasive Surgery  [facility], [place]  [place] [place], [place] [address] [phone] Fax [phone] Main  CONTACT INFORMATION:  Weekday (9AM-5PM): Call [facility] main office at [phone]  Weeknight (5PM-9AM) or Weekend/Holiday: Check [email or web address] (password " [email or web address]") for General Surgery [facility] coverage  (Please, do not use SecureChat as it is not reliable communication to operating surgeons for immediate patient care)      01/25/2022  8:19 AM

## 2022-01-26 DIAGNOSIS — I82413 Acute embolism and thrombosis of femoral vein, bilateral: Secondary | ICD-10-CM

## 2022-01-26 LAB — BASIC METABOLIC PANEL
Anion gap: 5 (ref 5–15)
BUN: 6 mg/dL — ABNORMAL LOW (ref 8–23)
CO2: 25 mmol/L (ref 22–32)
Calcium: 9.1 mg/dL (ref 8.9–10.3)
Chloride: 109 mmol/L (ref 98–111)
Creatinine, Ser: 0.37 mg/dL — ABNORMAL LOW (ref 0.44–1.00)
GFR, Estimated: >60 mL/min (ref >60)
Glucose, Bld: 128 mg/dL — ABNORMAL HIGH (ref 70–99)
Potassium: 3.9 mmol/L (ref 3.5–5.1)
Sodium: 139 mmol/L (ref 135–145)

## 2022-01-26 LAB — GLUCOSE, CAPILLARY
Glucose-Capillary: 118 mg/dL — ABNORMAL HIGH (ref 70–99)
Glucose-Capillary: 127 mg/dL — ABNORMAL HIGH (ref 70–99)
Glucose-Capillary: 128 mg/dL — ABNORMAL HIGH (ref 70–99)
Glucose-Capillary: 131 mg/dL — ABNORMAL HIGH (ref 70–99)
Glucose-Capillary: 131 mg/dL — ABNORMAL HIGH (ref 70–99)
Glucose-Capillary: 134 mg/dL — ABNORMAL HIGH (ref 70–99)
Glucose-Capillary: 145 mg/dL — ABNORMAL HIGH (ref 70–99)

## 2022-01-26 LAB — CBC
HCT: 29.8 % — ABNORMAL LOW (ref 36.0–46.0)
Hemoglobin: 8.7 g/dL — ABNORMAL LOW (ref 12.0–15.0)
MCH: 25.7 pg — ABNORMAL LOW (ref 26.0–34.0)
MCHC: 29.2 g/dL — ABNORMAL LOW (ref 30.0–36.0)
MCV: 87.9 fL (ref 80.0–100.0)
Platelets: 305 10*3/uL (ref 150–400)
RBC: 3.39 MIL/uL — ABNORMAL LOW (ref 3.87–5.11)
RDW: 18.5 % — ABNORMAL HIGH (ref 11.5–15.5)
WBC: 12 10*3/uL — ABNORMAL HIGH (ref 4.0–10.5)
nRBC: 0 % (ref 0.0–0.2)

## 2022-01-26 LAB — HEPARIN LEVEL (UNFRACTIONATED): Heparin Unfractionated: 0.48 IU/mL (ref 0.30–0.70)

## 2022-01-26 LAB — PHOSPHORUS: Phosphorus: 3.5 mg/dL (ref 2.5–4.6)

## 2022-01-26 LAB — MAGNESIUM: Magnesium: 2 mg/dL (ref 1.7–2.4)

## 2022-01-26 MED ORDER — APIXABAN 5 MG PO TABS
5.0000 mg | ORAL_TABLET | Freq: Two times a day (BID) | ORAL | Status: DC
  Administered 2022-02-02 – 2022-02-05 (×6): 5 mg
  Filled 2022-01-26 (×7): qty 1

## 2022-01-26 MED ORDER — APIXABAN 5 MG PO TABS: 5.0000 mg | ORAL_TABLET | Freq: Two times a day (BID) | ORAL | Status: DC

## 2022-01-26 MED ORDER — K PHOS MONO-SOD PHOS DI & MONO 155-852-130 MG PO TABS
500.0000 mg | ORAL_TABLET | Freq: Two times a day (BID) | ORAL | Status: AC
Start: 2022-01-26 — End: 2022-01-28
  Administered 2022-01-26 – 2022-01-28 (×6): 500 mg
  Filled 2022-01-26 (×6): qty 2

## 2022-01-26 MED ORDER — APIXABAN 5 MG PO TABS: 10.0000 mg | ORAL_TABLET | Freq: Two times a day (BID) | ORAL | Status: DC

## 2022-01-26 MED ORDER — APIXABAN 5 MG PO TABS
10.0000 mg | ORAL_TABLET | Freq: Two times a day (BID) | ORAL | Status: AC
  Administered 2022-01-26 – 2022-02-02 (×14): 10 mg
  Filled 2022-01-26 (×14): qty 2

## 2022-01-26 MED ORDER — ACETAMINOPHEN 160 MG/5ML PO SOLN
500.0000 mg | Freq: Four times a day (QID) | ORAL | Status: DC | PRN
  Administered 2022-01-27: 1000 mg
  Administered 2022-01-30 – 2022-02-01 (×2): 650 mg
  Filled 2022-01-26: qty 40.6
  Filled 2022-01-26 (×2): qty 20.3

## 2022-01-26 MED ORDER — OSMOLITE 1.5 CAL PO LIQD
1000.0000 mL | ORAL | Status: DC
  Administered 2022-01-26 – 2022-02-03 (×12): 1000 mL
  Filled 2022-01-26 (×14): qty 1000

## 2022-01-26 MED ORDER — ACETAMINOPHEN 160 MG/5ML PO SOLN: 325.0000 mg | Freq: Four times a day (QID) | ORAL | Status: DC | PRN

## 2022-01-26 NOTE — Progress Notes (Signed)
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 Active Problems:   Fecal peritonitis ([place])   Memory loss   Acute bilateral deep vein thrombosis (DVT) of femoral veins (HCC)   Perforated abdominal viscus   GERD (gastroesophageal reflux disease)   Hyperlipidemia   Hyponatremia   Acute blood loss anemia   Acute metabolic encephalopathy   Protein-calorie malnutrition, severe   Delirium   Ileostomy in place [facility])   Abnormal neurological exam   Hypercalcemia   Folate deficiency   Common bile duct dilatation   Enterococcus UTI   Goals of care, counseling/discussion   Vitamin B6 deficiency  11/17/2021   Post-operative Diagnosis:  perforated ascending colon with fecal peritonitis   Surgeon:  [name], MD   Procedure:  Exploratory laparotomy, extended right colectomy, end ileostomy [name]), and mucous fistula (distal transverse colon)  2 Days Post-Op    Procedure:               UGI Endoscopy with Peg Providers:                Felicie Morn, MD    Assessment  Stable  [facility] Stay = 70 days)  Assessment/Plan 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 Harlow Asa 11/27 - path: necrotic colon at site of perforation, no malignancy or dysplasia noted  -  Pathology benign. CT distal descending/proximal sigmoid bowel luminal collapse. - CT 1/11 - some extravasated oral contrast in the subcutaneous abdominal wall superior and lateral to the ostomy at this level. There is inflammatory tissue at the level of the ostomy extending superiorly abutting the inferior tip of the liver and fundus of the gallbladder similar to the prior examination. There is likely some early fluid collection development anterior to the right kidney image 7/31 measuring 1.9 by 1.4 by 3.7 cm. Previously described fluid collections near the ostomy are not as well defined on the current study. 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 There is no free intraperitoneal air or ascites. Small air-fluid collection at the level of the umbilicus is again seen containing air. This collection measures 2.5 x 0.7 by 1.3 cm and has mildly increased in size. Air in the collection is new from prior. - no collection around the ileostomy that is drainable - the best way to try to resolve this is abx and improving nutrition - zosyn completed for last CT scan findings and UTI on 1/20 - Dr. Hassell Done stopped her TNA on 1/17 - repeat CT 2/1: resolution in RLQ stranding and resolving hematoma/seroma,   - Ileostomy output improved. Continue BID metamucil & iron.  May need as needed Imodium but we will see how she is at goals in with bolus tube feed - WOC following for ileostomy, mucous fistula.  Will need to  set up for outpatient care.  Suspect this will be permanent given her severe deconditioning - Wound improved with healthy granulation tissue present, no tracts or purulent drainage present on dressing change this AM. Daily WTD dressing   FEN  -Severe malnutrition given feculent peritonitis and deconditioned patient with prolonged stay -weaned off TNA parenteral nutrition 1/17 since digestive tract  working. - Enteral nutrition.   -OK for Dysphagia 2 diet - but will not eat despite efforts by nursing and family.  Patient tends to hide/hoard food instead.  -palliative saw family & they agreed with a feeding tube since they feel  that is what her sister would want. Family consented to PEG placement late 2/2,   -PEG placed 2/3.   -Transitioned to on enteral meds via gastrostomy tube 2/3-    -Tolerating tube feeds.  Continue gradual advancement.  Go to 40 since ileostomy working well with no pain.  Advance 10 mLs an hour every 12 hours to goal of 60 by tomorrow morning .  Then possibly can switch to bolus feeds.  Follow electrolytes and exam closely -Hypokalemia.  Being replaced  -Hypophosphatemia not surprising with refeeding but not too severe.  Replete by switching to K-Phos.  Follow   VTE - Bilateral femoral DVTs noted on repeat CT AP 2/1,  - on heparin gtt per medicine -Okay to transition onto intermittent full anticoagulation.  Was presuming this would be an oral route but there is discussion between medicine and pharmacy about considering Lovenox shots.  Defer to them, but I think it is reasonable to start switching to that  ID  - fever curve improved,  - -UTI -  Urine Cx 1/10 with enterococcus faecalis sensitive to ampicillin, Zosyn completed. Urine Cx ordered 1/26 with multiple species, reordered 1/30.  urine cx with multiple species on repeat cx as well - no foley - blood cx 1/27 - with staph epi  1/2 - thought to be contaminate and repeat blood cxs sent 1/29 with the same - zosyn 11/27>12/2, 12/5>12/11, restarted 1/11> 1/20  -off Abx 1/20-  Foley - None currently.  Nursing using perineal wick   - Neuro: encephalopathy/FTT -   On folic acid and thiamine supplementation.  Appreciate TRH following as well. Psychiatry saw 12/27 and determined patient does not have capacity Getting neurochecks regularly without any decline.   Dispo: unclear at this time.  Possibly transition  out of hospital when meets goals of tube feeds and has full enteral nutrition.  Possibly 08 February   I reviewed last 24 h vitals and pain scores, last 48 h intake and output, and last 24 h labs and trends.   This care required moderate level of medical decision making.           30 minutes spent in review, evaluation, examination, counseling, and coordination of care.   I have reviewed this patient's available data, including medical history, events of note, physical examination and test results as part of my evaluation.  A significant portion of that time was spent in counseling.  Care during the described time interval was provided by me.  01/26/2022    Subjective: (Chief complaint)  Patient resting in bed.    Had large bowel movement through the ostomy last night but calm down now.  No major events.  Tube feeds via PEG Gtube tolerated at low 25 mL hour rate.  Objective:  Vital signs:  Vitals:   01/25/22 1351 01/25/22 2103 01/26/22 0457 01/26/22 0649  BP: 127/73 123/67  124/74  Pulse: 95 94  96  Resp: 20 16  16   Temp:  99.4 F (37.4 C)  98.2 F (36.8 C)  TempSrc:  Axillary  Oral  SpO2: 99% 96%  98%  Weight:   77.3 kg   Height:        Last BM Date: 01/25/22  Intake/Output   Yesterday:  02/04 0701 - 02/05 0700 In: 1102.2 [I.V.:445.6; NG/GT:396.7; IV Piggyback:260] Out: 1400 [Urine:1400] This shift:  No intake/output data recorded.  Bowel function:  Flatus: YES  BM:  YES  Drain: (No drain)   Physical Exam:  General: Pt resting but does awaken in no acute distress Eyes: PERRL, normal EOM.  Sclera clear.  No icterus Neuro: CN II-XII intact w/o focal sensory/motor deficits. Lymph: No head/neck/groin lymphadenopathy Psych: Still with baseline encephalopathy/confusion but no agitation Oriented x 1 HENT: Normocephalic, Mucus membranes moist.  No thrush Neck: Supple, No tracheal deviation.  No obvious thyromegaly Chest: No pain to chest wall  compression.  Good respiratory excursion.  No audible wheezing CV:  Pulses intact.  Regular rhythm.  No major extremity edema MS: Normal AROM mjr joints.  No obvious deformity  Abdomen: Soft.  Nondistended.  Right-sided ileostomy pink with flatus and oatmeal thick succus/effluent.  Left-sided: Mucous fistula pink with scant output.  Midline incisional wound with some poor granulation but clean.  No evidence of peritonitis.  No incarcerated hernias.  Ext:   No deformity.  No mjr edema.  No cyanosis Skin: No petechiae / purpurea.  No major sores.  Warm and dry    Results:   Cultures: Recent Results (from the past 720 hour(s))  Urine Culture     Status: Abnormal   Collection Time: 12/31/21 10:48 AM   Specimen: In/Out Cath Urine  Result Value Ref Range Status   Specimen Description   Final    IN/OUT CATH URINE Performed at [facility], [place] [address] [place], [place] [address]    Special Requests   Final    NONE Performed at [facility], [place] [address], [place], [place] [address]    Culture >=100,000 COLONIES/mL ENTEROCOCCUS FAECALIS (A)  Final   Report Status 01/02/2022 FINAL  Final   Organism ID, Bacteria ENTEROCOCCUS FAECALIS (A)  Final      Susceptibility   Enterococcus faecalis - MIC*    AMPICILLIN <=2 SENSITIVE Sensitive     NITROFURANTOIN <=16 SENSITIVE Sensitive     VANCOMYCIN 1 SENSITIVE Sensitive     * >=100,000 COLONIES/mL ENTEROCOCCUS FAECALIS  Urine Culture     Status: Abnormal   Collection Time: 01/16/22  5:44 PM   Specimen: Urine, Clean Catch  Result Value Ref Range Status   Specimen Description   Final    URINE, CLEAN CATCH Performed at [facility], [place] [address], [place], [place] [address]    Special Requests   Final    NONE Performed at [facility], [place] [address] [place], [place] [address]    Culture MULTIPLE SPECIES PRESENT, SUGGEST RECOLLECTION (A)  Final   Report  Status 01/18/2022 FINAL  Final  Culture, blood (Routine X 2) w Reflex to ID Panel     Status: None   Collection Time: 01/17/22  6:40 AM   Specimen: BLOOD LEFT HAND  Result Value Ref Range Status   Specimen Description   Final    BLOOD LEFT HAND Performed at [place] [address] [place],  [address]  Special Requests   Final    BOTTLES DRAWN AEROBIC ONLY Blood Culture results may not be optimal due to an inadequate volume of blood received in culture bottles Performed at [place] [address] [place], [place] [address]    Culture   Final    NO GROWTH 5 DAYS Performed at [facility], [place] [address], [place], [place] [address]    Report Status 01/22/2022 FINAL  Final  Culture, blood (Routine X 2) w Reflex to ID Panel     Status: Abnormal   Collection Time: 01/17/22  7:06 AM   Specimen: BLOOD  Result Value Ref Range Status   Specimen Description   Final    BLOOD RIGHT WRIST Performed at [place] [address], [place], [place] [address]    Special Requests   Final    BOTTLES DRAWN AEROBIC ONLY Blood Culture results may not be optimal due to an inadequate volume of blood received in culture bottles Performed at [place] [address], [place], [place] [address]    Culture  Setup Time   Final    GRAM POSITIVE COCCI IN CLUSTERS AEROBIC BOTTLE ONLY CRITICAL RESULT CALLED TO, READ BACK BY AND VERIFIED WITH: J,GADHIA PHARMD @1050  01/18/22 EB    Culture (A)  Final    STAPHYLOCOCCUS EPIDERMIDIS THE SIGNIFICANCE OF ISOLATING THIS ORGANISM FROM A SINGLE SET OF BLOOD CULTURES WHEN MULTIPLE SETS ARE DRAWN IS UNCERTAIN. PLEASE NOTIFY THE MICROBIOLOGY DEPARTMENT WITHIN ONE WEEK IF SPECIATION AND SENSITIVITIES ARE REQUIRED. Performed at [facility], [place] [address] [place], [place] [address]    Report Status 01/20/2022 FINAL  Final  Blood Culture ID Panel (Reflexed)      Status: Abnormal   Collection Time: 01/17/22  7:06 AM  Result Value Ref Range Status   Enterococcus faecalis NOT DETECTED NOT DETECTED Final   Enterococcus Faecium NOT DETECTED NOT DETECTED Final   Listeria monocytogenes NOT DETECTED NOT DETECTED Final   Staphylococcus species DETECTED (A) NOT DETECTED Final    Comment: CRITICAL RESULT CALLED TO, READ BACK BY AND VERIFIED WITH: J,GADHIA PHARMD @1050  01/18/22 EB    Staphylococcus aureus (BCID) NOT DETECTED NOT DETECTED Final   Staphylococcus epidermidis DETECTED (A) NOT DETECTED Final    Comment: Methicillin (oxacillin) resistant coagulase negative staphylococcus. Possible blood culture contaminant (unless isolated from more than one blood culture draw or clinical case suggests pathogenicity). No antibiotic treatment is indicated for blood  culture contaminants. CRITICAL RESULT CALLED TO, READ BACK BY AND VERIFIED WITH: J,GADHIA PHARMD @1050  01/18/22 EB    Staphylococcus lugdunensis NOT DETECTED NOT DETECTED Final   Streptococcus species NOT DETECTED NOT DETECTED Final   Streptococcus agalactiae NOT DETECTED NOT DETECTED Final   Streptococcus pneumoniae NOT DETECTED NOT DETECTED Final   Streptococcus pyogenes NOT DETECTED NOT DETECTED Final   A.calcoaceticus-baumannii NOT DETECTED NOT DETECTED Final   Bacteroides fragilis NOT DETECTED NOT DETECTED Final   Enterobacterales NOT DETECTED NOT DETECTED Final   Enterobacter cloacae complex NOT DETECTED NOT DETECTED Final   Escherichia coli NOT DETECTED NOT DETECTED Final   Klebsiella aerogenes NOT DETECTED NOT DETECTED Final   Klebsiella oxytoca NOT DETECTED NOT DETECTED Final   Klebsiella pneumoniae NOT DETECTED NOT DETECTED Final   Proteus species NOT DETECTED NOT DETECTED Final   Salmonella species NOT DETECTED NOT DETECTED Final   Serratia marcescens NOT DETECTED NOT DETECTED Final   Haemophilus influenzae NOT DETECTED NOT DETECTED Final  Neisseria meningitidis NOT DETECTED NOT DETECTED  Final   Pseudomonas aeruginosa NOT DETECTED NOT DETECTED Final   Stenotrophomonas maltophilia NOT DETECTED NOT DETECTED Final   Candida albicans NOT DETECTED NOT DETECTED Final   Candida auris NOT DETECTED NOT DETECTED Final   Candida glabrata NOT DETECTED NOT DETECTED Final   Candida krusei NOT DETECTED NOT DETECTED Final   Candida parapsilosis NOT DETECTED NOT DETECTED Final   Candida tropicalis NOT DETECTED NOT DETECTED Final   Cryptococcus neoformans/gattii NOT DETECTED NOT DETECTED Final   Methicillin resistance mecA/C DETECTED (A) NOT DETECTED Final    Comment: CRITICAL RESULT CALLED TO, READ BACK BY AND VERIFIED WITH: J,GADHIA PHARMD @1050  01/18/22 EB Performed at [facility], [address] [place], [place] [address]   Culture, blood (routine x 2)     Status: Abnormal   Collection Time: 01/19/22 11:27 AM   Specimen: BLOOD  Result Value Ref Range Status   Specimen Description   Final    BLOOD BLOOD RIGHT HAND Performed at [place] [address] [place], [place] [address]    Special Requests   Final    BOTTLES DRAWN AEROBIC ONLY Blood Culture adequate volume Performed at [place] [address] [place], [place] [address]    Culture  Setup Time   Final    GRAM POSITIVE COCCI IN CLUSTERS AEROBIC BOTTLE ONLY CRITICAL VALUE NOTED.  VALUE IS CONSISTENT WITH PREVIOUSLY REPORTED AND CALLED VALUE. Performed at [facility], [place] [address] [place], [place] [address]    Culture STAPHYLOCOCCUS EPIDERMIDIS (A)  Final   Report Status 01/22/2022 FINAL  Final   Organism ID, Bacteria STAPHYLOCOCCUS EPIDERMIDIS  Final      Susceptibility   Staphylococcus epidermidis - MIC*    CIPROFLOXACIN >=8 RESISTANT Resistant     ERYTHROMYCIN >=8 RESISTANT Resistant     GENTAMICIN <=0.5 SENSITIVE Sensitive     OXACILLIN >=4 RESISTANT Resistant     TETRACYCLINE 2 SENSITIVE Sensitive     VANCOMYCIN 1 SENSITIVE Sensitive      TRIMETH/SULFA 80 RESISTANT Resistant     CLINDAMYCIN >=8 RESISTANT Resistant     RIFAMPIN <=0.5 SENSITIVE Sensitive     Inducible Clindamycin NEGATIVE Sensitive     * STAPHYLOCOCCUS EPIDERMIDIS  Culture, blood (routine x 2)     Status: None   Collection Time: 01/19/22 11:33 AM   Specimen: BLOOD  Result Value Ref Range Status   Specimen Description   Final    BLOOD BLOOD LEFT HAND Performed at [place] [address] [place], [place] [address]    Special Requests   Final    BOTTLES DRAWN AEROBIC ONLY Blood Culture results may not be optimal due to an inadequate volume of blood received in culture bottles Performed at [place] [address], [place], [place] [address]    Culture   Final    NO GROWTH 5 DAYS Performed at [facility], [place] [address] [place], [place] [address]    Report Status 01/24/2022 FINAL  Final  Urine Culture     Status: Abnormal   Collection Time: 01/20/22 12:41 PM   Specimen: In/Out Cath Urine  Result Value Ref Range Status   Specimen Description   Final    IN/OUT CATH URINE Performed at [place] [address], [place], [place] [address]    Special Requests   Final    NONE Performed at [facility], [place] [address], [place], [place] [address]    Culture MULTIPLE SPECIES PRESENT, SUGGEST RECOLLECTION (A)  Final   Report Status 01/21/2022 FINAL  Final    Labs: Results for orders placed or performed during the hospital encounter of 11/17/21 (from the past 48 hour(s))  Glucose, capillary     Status: None   Collection Time: 01/24/22 11:38 AM  Result Value Ref Range   Glucose-Capillary 96 70 - 99 mg/dL    Comment: Glucose reference range applies only to samples taken after fasting for at least 8 hours.  Glucose, capillary     Status: Abnormal   Collection Time: 01/24/22  4:26 PM  Result Value Ref Range   Glucose-Capillary 104 (H) 70 - 99 mg/dL    Comment:  Glucose reference range applies only to samples taken after fasting for at least 8 hours.  Heparin level (unfractionated)     Status: Abnormal   Collection Time: 01/24/22  6:55 PM  Result Value Ref Range   Heparin Unfractionated <0.10 (L) 0.30 - 0.70 IU/mL    Comment: (NOTE) The clinical reportable range upper limit is being lowered to >1.10 to align with the FDA approved guidance for the current laboratory assay.  If heparin results are below expected values, and patient dosage has  been confirmed, suggest follow up testing of antithrombin III levels. Performed at [facility], [place] [address] [place], [place] [address]   Glucose, capillary     Status: Abnormal   Collection Time: 01/24/22  7:27 PM  Result Value Ref Range   Glucose-Capillary 107 (H) 70 - 99 mg/dL    Comment: Glucose reference range applies only to samples taken after fasting for at least 8 hours.  Heparin level (unfractionated)     Status: Abnormal   Collection Time: 01/24/22  8:46 PM  Result Value Ref Range   Heparin Unfractionated 0.25 (L) 0.30 - 0.70 IU/mL    Comment: (NOTE) The clinical reportable range upper limit is being lowered to >1.10 to align with the FDA approved guidance for the current laboratory assay.  If heparin results are below expected values, and patient dosage has  been confirmed, suggest follow up testing of antithrombin III levels. Performed at [facility], [place] [address] [place], [place] [address]   Glucose, capillary     Status: Abnormal   Collection Time: 01/25/22 12:49 AM  Result Value Ref Range   Glucose-Capillary 117 (H) 70 - 99 mg/dL    Comment: Glucose reference range applies only to samples taken after fasting for at least 8 hours.  Glucose, capillary     Status: Abnormal   Collection Time: 01/25/22  3:48 AM  Result Value Ref Range   Glucose-Capillary 119 (H) 70 - 99 mg/dL    Comment: Glucose reference range applies only to samples  taken after fasting for at least 8 hours.  CBC     Status: Abnormal   Collection Time: 01/25/22  5:23 AM  Result Value Ref Range   WBC 9.5 4.0 - 10.5 K/uL   RBC 3.62 (L) 3.87 - 5.11 MIL/uL   Hemoglobin 9.2 (L) 12.0 - 15.0 g/dL   HCT 31.9 (L) 36.0 - 46.0 %   MCV 88.1 80.0 - 100.0 fL   MCH 25.4 (L) 26.0 - 34.0 pg   MCHC 28.8 (L) 30.0 - 36.0 g/dL   RDW 18.1 (H) 11.5 - 15.5 %   Platelets 285 150 - 400 K/uL   nRBC 0.0 0.0 -  0.2 %    Comment: Performed at [facility], [place] [address], [place], [place] [address]  Heparin level (unfractionated)     Status: None   Collection Time: 01/25/22  5:23 AM  Result Value Ref Range   Heparin Unfractionated 0.37 0.30 - 0.70 IU/mL    Comment: (NOTE) The clinical reportable range upper limit is being lowered to >1.10 to align with the FDA approved guidance for the current laboratory assay.  If heparin results are below expected values, and patient dosage has  been confirmed, suggest follow up testing of antithrombin III levels. Performed at [facility], [place] [address], [place], [place] [address]   Glucose, capillary     Status: Abnormal   Collection Time: 01/25/22  7:16 AM  Result Value Ref Range   Glucose-Capillary 132 (H) 70 - 99 mg/dL    Comment: Glucose reference range applies only to samples taken after fasting for at least 8 hours.  Glucose, capillary     Status: Abnormal   Collection Time: 01/25/22 11:26 AM  Result Value Ref Range   Glucose-Capillary 125 (H) 70 - 99 mg/dL    Comment: Glucose reference range applies only to samples taken after fasting for at least 8 hours.  Potassium     Status: None   Collection Time: 01/25/22 12:11 PM  Result Value Ref Range   Potassium 4.4 3.5 - 5.1 mmol/L    Comment: DELTA CHECK NOTED Performed at [facility], [place] [address], [place], [place] [address]   Magnesium     Status: None   Collection Time: 01/25/22 12:11 PM  Result Value Ref  Range   Magnesium 1.9 1.7 - 2.4 mg/dL    Comment: Performed at [facility], [place] [address] [place], [place] [address]  Phosphorus     Status: Abnormal   Collection Time: 01/25/22 12:11 PM  Result Value Ref Range   Phosphorus 2.2 (L) 2.5 - 4.6 mg/dL    Comment: Performed at [facility], [place] [address] [place], [place] [address]  Heparin level (unfractionated)     Status: Abnormal   Collection Time: 01/25/22 12:11 PM  Result Value Ref Range   Heparin Unfractionated 0.19 (L) 0.30 - 0.70 IU/mL    Comment: (NOTE) The clinical reportable range upper limit is being lowered to >1.10 to align with the FDA approved guidance for the current laboratory assay.  If heparin results are below expected values, and patient dosage has  been confirmed, suggest follow up testing of antithrombin III levels. Performed at [facility], [place] [address], [place], [place] [address]   Glucose, capillary     Status: Abnormal   Collection Time: 01/25/22  3:51 PM  Result Value Ref Range   Glucose-Capillary 119 (H) 70 - 99 mg/dL    Comment: Glucose reference range applies only to samples taken after fasting for at least 8 hours.  Glucose, capillary     Status: Abnormal   Collection Time: 01/25/22  9:01 PM  Result Value Ref Range   Glucose-Capillary 127 (H) 70 - 99 mg/dL    Comment: Glucose reference range applies only to samples taken after fasting for at least 8 hours.  Heparin level (unfractionated)     Status: Abnormal   Collection Time: 01/25/22 10:36 PM  Result Value Ref Range   Heparin Unfractionated 0.79 (H) 0.30 - 0.70 IU/mL    Comment: (NOTE) The clinical reportable range upper limit is being lowered to >  1.10 to align with the FDA approved guidance for the current laboratory assay.  If heparin results are below expected values, and patient dosage has  been confirmed, suggest follow up testing of antithrombin III levels. Performed at  [facility], [place] [address], [place], [place] [address]   Glucose, capillary     Status: Abnormal   Collection Time: 01/26/22 12:30 AM  Result Value Ref Range   Glucose-Capillary 131 (H) 70 - 99 mg/dL    Comment: Glucose reference range applies only to samples taken after fasting for at least 8 hours.  Glucose, capillary     Status: Abnormal   Collection Time: 01/26/22  4:52 AM  Result Value Ref Range   Glucose-Capillary 145 (H) 70 - 99 mg/dL    Comment: Glucose reference range applies only to samples taken after fasting for at least 8 hours.  Glucose, capillary     Status: Abnormal   Collection Time: 01/26/22  7:27 AM  Result Value Ref Range   Glucose-Capillary 118 (H) 70 - 99 mg/dL    Comment: Glucose reference range applies only to samples taken after fasting for at least 8 hours.    Imaging / Studies: No results found.  Medications / Allergies: per chart  Antibiotics: Anti-infectives (From admission, onward)    Start     Dose/Rate Route Frequency Ordered Stop   01/01/22 2100  piperacillin-tazobactam (ZOSYN) IVPB 3.375 g  Status:  Discontinued        3.375 g 12.5 mL/hr over 240 Minutes Intravenous Every 8 hours 01/01/22 2042 01/10/22 1333   11/25/21 1000  piperacillin-tazobactam (ZOSYN) IVPB 3.375 g  Status:  Discontinued        3.375 g 12.5 mL/hr over 240 Minutes Intravenous Every 8 hours 11/25/21 0909 12/01/21 0822   11/17/21 1830  piperacillin-tazobactam (ZOSYN) IVPB 3.375 g        3.375 g 12.5 mL/hr over 240 Minutes Intravenous Every 8 hours 11/17/21 1415 11/22/21 2359   11/17/21 1230  piperacillin-tazobactam (ZOSYN) IVPB 3.375 g        3.375 g 100 mL/hr over 30 Minutes Intravenous  Once 11/17/21 1220 11/17/21 1304         Note: Portions of this report may have been transcribed using voice recognition software. Every effort was made to ensure accuracy; however, inadvertent computerized transcription errors may be present.   Any  transcriptional errors that result from this process are unintentional.    Adin Hector, MD, FACS, MASCRS Esophageal, Gastrointestinal & Colorectal Surgery Robotic and Minimally Invasive Surgery  [facility], [place]  [place]. [address] [place], [place] [address] [phone] Fax [phone] Main  CONTACT INFORMATION:  Weekday (9AM-5PM): Call [facility] main office at [phone]  Weeknight (5PM-9AM) or Weekend/Holiday: Check [email or web address] (password " [email or web address]") for General Surgery [facility] coverage  (Please, do not use SecureChat as it is not reliable communication to operating surgeons for immediate patient care)      01/26/2022  8:01 AM

## 2022-01-26 NOTE — [place] Notes (Signed)
PROGRESS NOTE    Sherry Hampton  MOQ:[id number] DOB: 02/08/1956 DOA: 11/17/2021 PCP: Pcp, No    No chief complaint on file.   Brief Narrative: 66 years old female with history of hyperlipidemia and mild memory loss presented to hospital with acute abnormal pain.  She was noted to have perforated viscus of the ascending colon with fecal peritonitis and required right colectomy and ileostomy.  Hospital course was complicated by fluid collection in the abdomen malnutrition progressive decline and encephalopathy.  [facility] was consulted for assistance with general medical management.     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 Active Problems:   Perforated abdominal viscus   GERD (gastroesophageal reflux disease)   Hyperlipidemia   Hyponatremia   Acute blood loss anemia   Acute metabolic encephalopathy   Protein-calorie malnutrition, severe   Delirium   Ileostomy in place [facility])   Abnormal neurological exam   Memory loss   Hypercalcemia   Folate deficiency   Common bile duct dilatation   Enterococcus UTI   Goals of care, counseling/discussion   Vitamin B6 deficiency   Fecal peritonitis ([place])   Acute bilateral deep vein thrombosis (DVT) of femoral veins (HCC)   Hypophosphatemia  #1 perforated abdominal viscus -Status post exploratory laparotomy with left colectomy and end ileostomy. -Per primary surgical team.  2.  Enterococcus UTI -Resolved -Status post completion of antibiotics recently.  3.  Episode of fever -Currently afebrile with T-max of 100 today but has since trended down. -Repeat blood cultures from 01/19/2022 with no growth x3 days and 1 bottle 1 bottle with staph epidermis likely contaminant, blood cultures from 01/17/2022 negative x5 days. -Chest x-ray done 01/16/2022 negative for any acute infiltrate. -Status post PICC line removal. -Patient recently completed course of  antibiotics. -Dr. Louanne Belton spoke with Dr. Candiss Norse ID on 01/19/2022 who recommended repeat blood cultures which have been negative so far. -Continue to monitor off antibiotics.  4.  Mild transaminitis -Improved.  5.  Acute metabolic encephalopathy  -MRI brain 12/11/2021 with no acute findings with repeat MRI 01/05/2022 negative. -EEG done showed slowing consistent with encephalopathy. -Neurology consulted during the hospitalization has since signed off. -Continue vitamin B6, folic acid.  6.  Vitamin B6 deficiency/folic acid deficiency -Continue supplementation.   7.  Hypomagnesemia/hypokalemia/hypophosphatemia -Patient placed on oral potassium supplementation per primary team.   -Phosphorus at 3.5 -Magnesium at 2.  8.  Hypercalcemia -Improved. -PTH RP less than 2.  9.  Severe protein calorie malnutrition/failure to thrive/poor oral intake -Primary team recommended PEG tube family, palliative care discussed with family. -Patient seen by ethics committee. -IR consulted for PEG tube placement early on, patient being followed by general surgery and general surgery recommended PEG tube placement which was done on 01/24/2022 on endoscopy.  -Patient started on tube feeds.  -Per primary team.  10.  Bilateral femoral vein DVT -Was on IV heparin. -Okay to transition to Eliquis from a medicine perspective.  10.  Goals of care -Palliative care following.    DVT prophylaxis: IV heparin>>>>> Eliquis Code Status: Full Family Communication: No family at bedside. Disposition:   Status is: Inpatient Remains inpatient appropriate because: Severity of illness           Consultants:  Consult: Dr. Cathlean Sauer 1/30-31/2023 [facility] Palliative care Psychiatry Neurology  Procedures:  Status post exploratory laparotomy with right colectomy and ileostomy on 11/17/2021 PEG tube placement 01/24/2022  Antimicrobials:  Anti-infectives (From admission, onward)  Start     Dose/Rate  Route Frequency Ordered Stop   01/01/22 2100  piperacillin-tazobactam (ZOSYN) IVPB 3.375 g  Status:  Discontinued        3.375 g 12.5 mL/hr over 240 Minutes Intravenous Every 8 hours 01/01/22 2042 01/10/22 1333   11/25/21 1000  piperacillin-tazobactam (ZOSYN) IVPB 3.375 g  Status:  Discontinued        3.375 g 12.5 mL/hr over 240 Minutes Intravenous Every 8 hours 11/25/21 0909 12/01/21 0822   11/17/21 1830  piperacillin-tazobactam (ZOSYN) IVPB 3.375 g        3.375 g 12.5 mL/hr over 240 Minutes Intravenous Every 8 hours 11/17/21 1415 11/22/21 2359   11/17/21 1230  piperacillin-tazobactam (ZOSYN) IVPB 3.375 g        3.375 g 100 mL/hr over 30 Minutes Intravenous  Once 11/17/21 1220 11/17/21 1304         Subjective: Sleeping but arousable.  No shortness of breath.  No chest pain.    Objective: Vitals:   01/25/22 1351 01/25/22 2103 01/26/22 0457 01/26/22 0649  BP: 127/73 123/67  124/74  Pulse: 95 94  96  Resp: 20 16  16   Temp:  99.4 F (37.4 C)  98.2 F (36.8 C)  TempSrc:  Axillary  Oral  SpO2: 99% 96%  98%  Weight:   77.3 kg   Height:        Intake/Output Summary (Last 24 hours) at 01/26/2022 1220 Last data filed at 01/26/2022 1114 Gross per 24 hour  Intake 1102.24 ml  Output 1250 ml  Net -147.76 ml    Filed Weights   01/20/22 0918 01/24/22 0653 01/26/22 0457  Weight: 79.7 kg 79.7 kg 77.3 kg    Examination:  General exam: Frail.  Deconditioned.  Dry mucous membranes. Respiratory system: CTA B anterior lung fields.  No wheezes, no crackles, no rhonchi.  Normal respiratory effort. Cardiovascular system: RRR no murmurs rubs or gallops.  No JVD.  No lower extremity edema.   Gastrointestinal system: Abdomen is soft, nontender, nondistended.  Ileostomy in place.  Midline incision. 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  PEG tube in place.  Abdominal binder.  Normal bowel sounds heard. Central nervous system: Sleeping but arousable.. No focal neurological  deficits. Extremities: Symmetric 5 x 5 power. Skin: No rashes, lesions or ulcers Psychiatry: Judgement and insight appear poor to fair. Mood & affect appropriate.     Data Reviewed: I have personally reviewed following labs and imaging studies  CBC: Recent Labs  Lab 01/20/22 0428 01/23/22 0446 01/24/22 0054 01/25/22 0523 01/26/22 0829  WBC 6.0 6.3 5.5 9.5 12.0*  HGB 8.5* 9.1* 8.0* 9.2* 8.7*  HCT 28.9* 30.9* 28.0* 31.9* 29.8*  MCV 88.1 88.3 89.2 88.1 87.9  PLT 260 322 283 285 305     Basic Metabolic Panel: Recent Labs  Lab 01/20/22 0428 01/21/22 0357 01/22/22 0434 01/23/22 0446 01/24/22 0054 01/25/22 1211 01/26/22 0829  NA 140 140 141  --  141  --  139  K 3.4* 3.4* 3.5  --  3.2* 4.4 3.9  CL 110 111 112*  --  112*  --  109  CO2 24 26 24   --  25  --  25  GLUCOSE 100* 105* 105*  --  106*  --  128*  BUN 8 5* 6*  --  7*  --  6*  CREATININE <0.30* 0.31* <0.30*  --  0.34*  --  0.37*  CALCIUM 9.5 9.7 10.2  --  9.8  --  9.1  MG 1.6* 1.9 1.8 1.9 1.8 1.9 2.0  PHOS  --   --   --   --  2.6 2.2* 3.5     GFR: Estimated Creatinine Clearance: 73.8 mL/min (A) (by C-G formula based on SCr of 0.37 mg/dL (L)).  Liver Function Tests: Recent Labs  Lab 01/20/22 0428 01/24/22 0054  AST 17  --   ALT 16  --   ALKPHOS 64  --   BILITOT 0.5  --   PROT 7.5  --   ALBUMIN 1.7* 1.7*     CBG: Recent Labs  Lab 01/25/22 2101 01/26/22 0030 01/26/22 0452 01/26/22 0727 01/26/22 1113  GLUCAP 127* 131* 145* 118* 134*      Recent Results (from the past 240 hour(s))  Urine Culture     Status: Abnormal   Collection Time: 01/16/22  5:44 PM   Specimen: Urine, Clean Catch  Result Value Ref Range Status   Specimen Description   Final    URINE, CLEAN CATCH Performed at [facility], [place] [address] [place], [place] [address]    Special Requests   Final    NONE Performed at [facility], [place] [address], [place], [place] [address]    Culture  MULTIPLE SPECIES PRESENT, SUGGEST RECOLLECTION (A)  Final   Report Status 01/18/2022 FINAL  Final  Culture, blood (Routine X 2) w Reflex to ID Panel     Status: None   Collection Time: 01/17/22  6:40 AM   Specimen: BLOOD LEFT HAND  Result Value Ref Range Status   Specimen Description   Final    BLOOD LEFT HAND Performed at [place] [address], [place], [place] [address]    Special Requests   Final    BOTTLES DRAWN AEROBIC ONLY Blood Culture results may not be optimal due to an inadequate volume of blood received in culture bottles Performed at [place] [address] [place], [place] [address]    Culture   Final    NO GROWTH 5 DAYS Performed at [facility], [place] [address], [place], [place] [address]    Report Status 01/22/2022 FINAL  Final  Culture, blood (Routine X 2) w Reflex to ID Panel     Status: Abnormal   Collection Time: 01/17/22  7:06 AM   Specimen: BLOOD  Result Value Ref Range Status   Specimen Description   Final    BLOOD RIGHT WRIST Performed at [place] [address], [place], [place] [address]    Special Requests   Final    BOTTLES DRAWN AEROBIC ONLY Blood Culture results may not be optimal due to an inadequate volume of blood received in culture bottles Performed at [place] [address] [place], [place] [address]    Culture  Setup Time   Final    GRAM POSITIVE COCCI IN CLUSTERS AEROBIC BOTTLE ONLY CRITICAL RESULT CALLED TO, READ BACK BY AND VERIFIED WITH: J,GADHIA PHARMD @1050  01/18/22 EB    Culture (A)  Final    STAPHYLOCOCCUS EPIDERMIDIS THE SIGNIFICANCE OF ISOLATING THIS ORGANISM FROM A SINGLE SET OF BLOOD CULTURES WHEN MULTIPLE SETS ARE DRAWN IS UNCERTAIN. PLEASE NOTIFY THE MICROBIOLOGY DEPARTMENT WITHIN ONE WEEK IF SPECIATION AND SENSITIVITIES ARE REQUIRED. Performed at [facility], [place] [address], [place], [place] [address]    Report  Status 01/20/2022 FINAL  Final  Blood Culture ID Panel (Reflexed)  Status: Abnormal   Collection Time: 01/17/22  7:06 AM  Result Value Ref Range Status   Enterococcus faecalis NOT DETECTED NOT DETECTED Final   Enterococcus Faecium NOT DETECTED NOT DETECTED Final   Listeria monocytogenes NOT DETECTED NOT DETECTED Final   Staphylococcus species DETECTED (A) NOT DETECTED Final    Comment: CRITICAL RESULT CALLED TO, READ BACK BY AND VERIFIED WITH: J,GADHIA PHARMD @1050  01/18/22 EB    Staphylococcus aureus (BCID) NOT DETECTED NOT DETECTED Final   Staphylococcus epidermidis DETECTED (A) NOT DETECTED Final    Comment: Methicillin (oxacillin) resistant coagulase negative staphylococcus. Possible blood culture contaminant (unless isolated from more than one blood culture draw or clinical case suggests pathogenicity). No antibiotic treatment is indicated for blood  culture contaminants. CRITICAL RESULT CALLED TO, READ BACK BY AND VERIFIED WITH: J,GADHIA PHARMD @1050  01/18/22 EB    Staphylococcus lugdunensis NOT DETECTED NOT DETECTED Final   Streptococcus species NOT DETECTED NOT DETECTED Final   Streptococcus agalactiae NOT DETECTED NOT DETECTED Final   Streptococcus pneumoniae NOT DETECTED NOT DETECTED Final   Streptococcus pyogenes NOT DETECTED NOT DETECTED Final   A.calcoaceticus-baumannii NOT DETECTED NOT DETECTED Final   Bacteroides fragilis NOT DETECTED NOT DETECTED Final   Enterobacterales NOT DETECTED NOT DETECTED Final   Enterobacter cloacae complex NOT DETECTED NOT DETECTED Final   Escherichia coli NOT DETECTED NOT DETECTED Final   Klebsiella aerogenes NOT DETECTED NOT DETECTED Final   Klebsiella oxytoca NOT DETECTED NOT DETECTED Final   Klebsiella pneumoniae NOT DETECTED NOT DETECTED Final   Proteus species NOT DETECTED NOT DETECTED Final   Salmonella species NOT DETECTED NOT DETECTED Final   Serratia marcescens NOT DETECTED NOT DETECTED Final   Haemophilus influenzae NOT DETECTED  NOT DETECTED Final   Neisseria meningitidis NOT DETECTED NOT DETECTED Final   Pseudomonas aeruginosa NOT DETECTED NOT DETECTED Final   Stenotrophomonas maltophilia NOT DETECTED NOT DETECTED Final   Candida albicans NOT DETECTED NOT DETECTED Final   Candida auris NOT DETECTED NOT DETECTED Final   Candida glabrata NOT DETECTED NOT DETECTED Final   Candida krusei NOT DETECTED NOT DETECTED Final   Candida parapsilosis NOT DETECTED NOT DETECTED Final   Candida tropicalis NOT DETECTED NOT DETECTED Final   Cryptococcus neoformans/gattii NOT DETECTED NOT DETECTED Final   Methicillin resistance mecA/C DETECTED (A) NOT DETECTED Final    Comment: CRITICAL RESULT CALLED TO, READ BACK BY AND VERIFIED WITH: J,GADHIA PHARMD @1050  01/18/22 EB Performed at [facility], [address], [place], [place] [address]   Culture, blood (routine x 2)     Status: Abnormal   Collection Time: 01/19/22 11:27 AM   Specimen: BLOOD  Result Value Ref Range Status   Specimen Description   Final    BLOOD BLOOD RIGHT HAND Performed at [place] [address], [place], [place] [address]    Special Requests   Final    BOTTLES DRAWN AEROBIC ONLY Blood Culture adequate volume Performed at [place] [address], [place], [place] [address]    Culture  Setup Time   Final    GRAM POSITIVE COCCI IN CLUSTERS AEROBIC BOTTLE ONLY CRITICAL VALUE NOTED.  VALUE IS CONSISTENT WITH PREVIOUSLY REPORTED AND CALLED VALUE. Performed at [facility], [place] [address], [place], [place] [address]    Culture STAPHYLOCOCCUS EPIDERMIDIS (A)  Final   Report Status 01/22/2022 FINAL  Final   Organism ID, Bacteria STAPHYLOCOCCUS EPIDERMIDIS  Final      Susceptibility  Staphylococcus epidermidis - MIC*    CIPROFLOXACIN >=8 RESISTANT Resistant     ERYTHROMYCIN >=8 RESISTANT Resistant     GENTAMICIN <=0.5 SENSITIVE Sensitive     OXACILLIN >=4 RESISTANT Resistant     TETRACYCLINE 2  SENSITIVE Sensitive     VANCOMYCIN 1 SENSITIVE Sensitive     TRIMETH/SULFA 80 RESISTANT Resistant     CLINDAMYCIN >=8 RESISTANT Resistant     RIFAMPIN <=0.5 SENSITIVE Sensitive     Inducible Clindamycin NEGATIVE Sensitive     * STAPHYLOCOCCUS EPIDERMIDIS  Culture, blood (routine x 2)     Status: None   Collection Time: 01/19/22 11:33 AM   Specimen: BLOOD  Result Value Ref Range Status   Specimen Description   Final    BLOOD BLOOD LEFT HAND Performed at [place] [address], [place], [place] [address]    Special Requests   Final    BOTTLES DRAWN AEROBIC ONLY Blood Culture results may not be optimal due to an inadequate volume of blood received in culture bottles Performed at [place] [address] [place], [place] [address]    Culture   Final    NO GROWTH 5 DAYS Performed at [facility], [place] [address] [place], [place] [address]    Report Status 01/24/2022 FINAL  Final  Urine Culture     Status: Abnormal   Collection Time: 01/20/22 12:41 PM   Specimen: In/Out Cath Urine  Result Value Ref Range Status   Specimen Description   Final    IN/OUT CATH URINE Performed at [place] [address], [place], [place] [address]    Special Requests   Final    NONE Performed at [facility], [place] [address], [place], [place] [address]    Culture MULTIPLE SPECIES PRESENT, SUGGEST RECOLLECTION (A)  Final   Report Status 01/21/2022 FINAL  Final          Radiology Studies: No results found.      Scheduled Meds:  apixaban  10 mg Per Tube BID   Followed by   Derrill Memo ON 02/02/2022] apixaban  5 mg Per Tube BID   chlorhexidine  15 mL Mouth Rinse BID   ferrous sulfate  220 mg Per Tube BID WC   folic acid  1 mg Per Tube Daily   insulin aspart  0-9 Units Subcutaneous Q4H   mouth rinse  15 mL Mouth Rinse q12n4p   multivitamin with minerals  1 tablet Per Tube Daily   niacin  100 mg Per  Tube QHS   pantoprazole sodium  40 mg Per Tube Daily   phosphorus  500 mg Per Tube BID   psyllium  1 packet Oral BID   vitamin B-6  100 mg Per Tube Daily   thiamine  100 mg Per Tube BID   Continuous Infusions:  sodium chloride 10 mL/hr at 01/26/22 0415   feeding supplement (OSMOLITE 1.5 CAL) 1,000 mL (01/26/22 0904)   lactated ringers       LOS: 70 days    Time spent: 35 minutes    Irine Seal, MD [facility]   To contact the attending provider between 7A-7P or the covering provider during after hours 7P-7A, please log into the web site [email or web address] and access using universal [place] password for that web site. If you do not have the password, please call the hospital operator.  01/26/2022, 12:20 PM

## 2022-01-26 NOTE — [place] Notes (Addendum)
ANTICOAGULATION CONSULT NOTE  Pharmacy Consult for Heparin Indication: Bilateral femoral DVTs  No Known Allergies  Patient Measurements: Height: 5\' 10"  (177.8 cm) Weight: 77.3 kg (170 lb 6.7 oz) IBW/kg (Calculated) : 68.5 Heparin Dosing Weight: total body weight  Vital Signs: Temp: 98.2 F (36.8 C) (02/05 0649) Temp Source: Oral (02/05 0649) BP: 124/74 (02/05 0649) Pulse Rate: 96 (02/05 0649)  Labs: Recent Labs    01/24/22 0054 01/24/22 1855 01/25/22 0523 01/25/22 1211 01/25/22 2236 01/26/22 0829  HGB 8.0*  --  9.2*  --   --  8.7*  HCT 28.0*  --  31.9*  --   --  29.8*  PLT 283  --  285  --   --  305  HEPARINUNFRC 0.43   < > 0.37 0.19* 0.79* 0.48  CREATININE 0.34*  --   --   --   --  0.37*   < > = values in this interval not displayed.     Estimated Creatinine Clearance: 73.8 mL/min (A) (by C-G formula based on SCr of 0.37 mg/dL (L)).   Medical History: Past Medical History:  Diagnosis Date   Anemia    past hx    Arthritis    wrists , knees,  feet    Cataract    cordical cataracts per pt    Constipation    takes a stool softener  daily- as long as does this has daily soft stools    GERD (gastroesophageal reflux disease)    Hemorrhoids    Hyperlipidemia     Assessment: 66 yo female admitted 11/17/21 with 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 On 01/22/22, found to have bilateral femoral DVTs on CT scan.  Pharmacy consulted to dose IV heparin; has been receiving lovenox 40mg  q24h - last dose 01/22/22 at 0720.   01/26/22:  Heparin level now therapeutic on 1800 units/hr CBC: Hg low/stable, pltc WNL No bleeding noted per d/w RN Heparin infusing at correct rate without issues per RN Transition to Eliquis today  Goal of Therapy:  Heparin level 0.3-0.7 units/ml Monitor platelets by anticoagulation protocol: Yes   Plan:  Start Eliquis 10 mg per tube BID x 7 days, followed by 5 mg PT BID thereafter Stop heparin with first dose of Eliquis Check  coverage on Monday (can switch to Xarelto if needed) Pharmacy to provide DOAC education/coupons prior to discharge    Reuel Boom, PharmD, BCPS [phone] 01/26/2022, 10:15 AM

## 2022-01-27 ENCOUNTER — Encounter (HOSPITAL_COMMUNITY): Payer: Self-pay | Admitting: Surgery

## 2022-01-27 LAB — GLUCOSE, CAPILLARY
Glucose-Capillary: 140 mg/dL — ABNORMAL HIGH (ref 70–99)
Glucose-Capillary: 135 mg/dL — ABNORMAL HIGH (ref 70–99)
Glucose-Capillary: 128 mg/dL — ABNORMAL HIGH (ref 70–99)
Glucose-Capillary: 134 mg/dL — ABNORMAL HIGH (ref 70–99)
Glucose-Capillary: 113 mg/dL — ABNORMAL HIGH (ref 70–99)
Glucose-Capillary: 138 mg/dL — ABNORMAL HIGH (ref 70–99)

## 2022-01-27 LAB — CBC
HCT: 26.6 % — ABNORMAL LOW (ref 36.0–46.0)
Hemoglobin: 7.9 g/dL — ABNORMAL LOW (ref 12.0–15.0)
MCH: 25.6 pg — ABNORMAL LOW (ref 26.0–34.0)
MCHC: 29.7 g/dL — ABNORMAL LOW (ref 30.0–36.0)
MCV: 86.4 fL (ref 80.0–100.0)
Platelets: 282 10*3/uL (ref 150–400)
RBC: 3.08 MIL/uL — ABNORMAL LOW (ref 3.87–5.11)
RDW: 18.5 % — ABNORMAL HIGH (ref 11.5–15.5)
WBC: 11 10*3/uL — ABNORMAL HIGH (ref 4.0–10.5)
nRBC: 0 % (ref 0.0–0.2)

## 2022-01-27 LAB — MAGNESIUM: Magnesium: 1.9 mg/dL (ref 1.7–2.4)

## 2022-01-27 LAB — POTASSIUM: Potassium: 3.7 mmol/L (ref 3.5–5.1)

## 2022-01-27 LAB — PHOSPHORUS: Phosphorus: 2.5 mg/dL (ref 2.5–4.6)

## 2022-01-27 NOTE — [place] Notes (Signed)
Received call from patients sister, Sherry Hampton, asking for an update on her sister. I informed her that patient was resting comfortably at the moment with her tube feeds running without issue. She proceeded to ask "do you see some progress since she started on those?"... I asked [name] to clarify what she meant by "progress". She stated "you know, is she talking more and moving around more? Do you think she is getting better?"... I answered by saying that [name] was in the same state both physically and mentally that she was the last time I took care of her as a patient a week or 2 ago. She then proceeded to say " she got blood clots after you all didn't put her blue wraps on her legs, is she wearing them now? Is her drip still going?".. I responded that the heparin drip was stopped and she is now receiving Eliquis and also that scd's are contraindicated for use when patient has a dvt. I did let her know that I would clarify with the physician if and when we are to use the scd's again. [name] became very tearful and started crying saying "my day was so terrible, [place], please tell Chrissy I called". I reassured [name] that I would pass along the message and that she could call anytime for an update. [name] thanked me for the information.

## 2022-01-27 NOTE — Discharge Summary (Addendum)
[facility] Discharge Summary   Patient ID: Sherry Hampton MRN: [id number] DOB/AGE: [birth year] 66 y.o.  Admit date: 11/17/2021 Discharge date: 02/05/2022  Admitting Diagnosis: Pneumoperitoneum  Rheumatoid arthritis   Discharge Diagnosis Cecal perforation from large bowel obstruction  Severe protein calorie malnutrition Physical deconditioning Failure to thrive Bilateral femoral DVT ABL anemia, stable  Metabolic encephalopathy  Rheumatoid arthritis  Consultants Internal medicine  Neurology  Psychiatry  Palliative care Ethics committee   Imaging: IR GJ Tube Change  Result Date: 02/05/2022 INDICATION: Inadvertent removal of gastrostomy catheter. Temporary Foley catheter placed in the tract bedside. EXAM: GASTROSTOMY CHANGE UNDER FLUOROSCOPY MEDICATIONS: None indicated; ANESTHESIA/SEDATION: None required CONTRAST:  10 mL Omnipaque 300-administered into the gastric lumen. PROCEDURE: Informed written consent was obtained from the patient after a thorough discussion of the procedural risks, benefits and alternatives. All questions were addressed. Maximal Sterile Barrier Technique was utilized including caps, mask, sterile gowns, sterile gloves, sterile drape, hand hygiene and skin antiseptic. A timeout was performed prior to the initiation of the procedure. Small contrast injection through the temporary catheter in the tract demonstrated opacification of the decompressed stomach. Catheter was cut and exchanged over an Amplatz wire for a new 24 French balloon retention catheter. The balloon retention was inflated with 20 mL saline opacified with 1 mL contrast. Contrast injection confirms good intraluminal positioning. Catheter was flushed and capped. The patient tolerated the procedure well. FLUOROSCOPY: Radiation Exposure Index (as provided by the fluoroscopic device): 6 mGy Kerma COMPLICATIONS: None immediate. IMPRESSION: 1. Technically successful exchange of gastrostomy  catheter for a new 24 French balloon retention device, okay for routine use. Electronically Signed   By: Lucrezia Europe M.D.   On: 02/05/2022 07:28    Procedures Dr. [name] (11/17/21) - Exploratory laparotomy, extended right hemicolectomy, end ileostomy, mucus fistula   Dr. Eddie Dibbles Stechschulte (01/24/22) - PEG placement   HPI: Patient is a 66 year old female who presented to the emergency department with sudden onset of abdominal pain and distention.  White blood cell count was elevated at 13,000. CT scan of abdomen and pelvis showed findings consistent with free air with a large volume pneumoperitoneum. Site of perforation appeared to be a diverticulum in the ascending colon. There was also noted a change in the caliber of the colon in the distal sigmoid colon possibly representing neoplasm or stricture. There was evidence of diverticulosis. Patient had no prior history of colonic disease. Previous abdominal surgery included an oophorectomy. Patient did report a history of rheumatoid arthritis and was following with a rheumatologist. She was on methotrexate. She was not on steroids. Patient is from [place].  She has lived in [place] for approximately 10 years.  She does not have any family here.  Hospital Course:  Patient was admitted and emergently underwent procedure listed above.  Tolerated procedure well and was transferred to the floor. Feculent peritonitis was noted intra-operatively and patient kept on antibiotics for initially 5 days post-operatively. WOC RN was consulted for new ileostomy and mucus fistula and foley was removed on POD1. Patient started on CLD on POD2 and diet was advanced gradually as tolerated although patient struggled with poor appetite. Wound VAC placed to midline wound POD3. On POD5 patient was exhibiting more confusion and this was monitored over the following days. Surgical pathology came back as benign, however with initial imaging and follow up CTs, outpatient  colonoscopy to evaluate distal colon was/is recommended. Increased leukocytosis noted POD6-7 and CT done POD7 that showed fluid along right pericolic gutter and peripancreatic  fluid but no organized drainable collection, antibiotics were restarted for a 6 day course. Patient had continued poor PO intake and Cortrak was placed for tube feeding POD9. CT head was done for encephalopathy and decline in physical functioning as well and was negative for any acute processes. Internal medicine was consulted. Delirium precautions ordered and any medications that could contribute to encephalopathy were either discontinued or limited as much as able. Internal medicine ordered extensive workup and found folate deficiency and patient was started on folic acid supplement and thiamine supplement. Feeding tube was removed POD12 and patient refused replacement, this was discussed with family as well who also refused replacement. Patient was started on TPN 12/12 due to continued poor PO intake and failure to thrive, replacement of nasoenteric tube for enteral feeding was refused. VAC removed 12/14 to monitor wound more closely given air bubbles noted in wound, purulent drainage noted 12/19 and dressing changes increased. Neurology consulted 12/20 when patient was noted to have trouble with motor coordination with PT. MRI brain 12/21 with no acute processes and neuro signed off. Multiple appetite stimulants were tried to encourage patient to take in more and she continued to have poor PO intake. PEG placement first discussed with family and patient on 12/23 for supplemental nutrition since no facilities were willing to accept patient on TPN. Patient stated she was not interested in gastrostomy placement 12/27 however with continued confusion and delirium psych was consulted for competency. Psych evaluated and determined that patient did not have capacity to make medical decisions. Repeat CT 12/28 showed complex collection at incision,  wound was probed and purulent drainage expressed. Dressing changes increased back to TID from BID. Patient noted increased swelling LLE 12/30, vascular US was done and was negative for DVT. Patient also noted to have fever 12/31, initial workup was negative. Palliative medicine was consulted and spoke with patient and family to try to clarify goals of care 12/31 since no decision had been made on PEG placement for enteral nutrition. Repeat CT 1/1 was stable. It was again reiterated to family that patient would benefit from PEG placement to improve enteral nutrition, they agreed to discuss but no formal decision or consent to procedure was given. Need for enteral feeding access discussed with patient's family at bedside and over the phone on numerous occasions over the following weeks. Family struggled with making a decision on placement of feeding tube. Internal medicine asked to follow again 1/7 for assistance in management of non-surgical issues. Neurology re-consulted 1/10 for continued encephalopathy and family request for further neurologic testing. Patient's brother, Sherry Hampton, stated on 1/10 that family was not willing to proceed with PEG placement at that time. Urine culture sent 1/10 for frequent urination and grew out enterococcus faecalis, and she was treated with appropriate course of antibiotics. Patient had repeat CT 1/11 that showed possible fistula from ileostomy to parastomal space without a drainable collection, no signs of skin changes around ileostomy were noted. CT also showed persistent collection in wound base, wound was probed more deeply 1/12 with expression of purulent drainage which improved with wound care. MRI C spine was done 1/12 and showed some degenerative changes but nothing acute. Thoracic and lumbar MRI done 12/14 and did not show any acute findings. MRI brain repeated 12/15 and was stable from previous. EEG done 1/16 and showed generalized slowing consistent with encephalopathy. TPN  was stopped 1/17 by attending physician. Ethics committee involved 1/17. Family in disagreement on placement on feeding tube as of 1/24. Pt  febrile to 101 1/25, PICC line removed, CXR and UA sent along with urine and blood cultures. Urine culture came back with multiple species and blood culture grew staph epidermis in 1/2 bottles. Palliative care re-engaged 1/26 to reiterate that we either needed to consider placement of gastrostomy tube to provide enteral nutrition or consider comfort measures for patient. Sherry Hampton, patient's sister, expressed that she would want to proceed with PEG placement but did not provide consent for procedure at that time. Attempts were made to contact other family members as well and providers were unable to get in touch with them. Clinical staff was at times unable to get in touch with family members to provide updates. This significantly complicated ability to progress patient care at times. Patient's brother, Sherry Hampton, was the initial primary POC for patient however this later transitioned to patient's sister, Sherry Hampton, due to [employer] work schedule. Patient's family with difficulty agreeing on what is best for patient at times and with some desire for medical providers to just make decisions on care without needing family consent. It was explained on multiple occasions that if family wants to be involved in decisions on care that from a legal standpoint the decisions are made by family after discussion with providers. Patient does not have a designated POA, so attempts were made to allow any siblings that wanted to be involved were able. Some but not all siblings names and contact numbers were ever made available to medical team. Follow up blood cultures and urine cultures were sent and blood culture once again grew staph epidermis and urine culture showed multiple species. Patient's fever curve improved. Ethics re-involved 1/30 and also made attempts to contact family members in regards to  consent for PEG placement, on 1/31 they determined that "after thorough review of risks versus benefit, if there is a continuous delay in the patient's family signing the consent for PEG tube insertion and it is affecting patient's care, it is reasonable to proceed with IR PEG tube placement without a formal signed consent.". IR was consulted 2/1 for PEG placement and recommended repeat CT, this was done and showed resolution of RLQ collection and stranding but revealed bilateral femoral DVTs. Patient was started on heparin gtt for treatment of DVTs. Patient's sister, Sherry Hampton, finally gave consent for PEG placement 2/2. This ultimately ended up being done by general surgery in endoscopy as listed above. Patient tolerated procedure well. Tube feeds were gradually advanced as tolerated and she was tolerating goal rate by 2/6. Patient transitioned to Eliquis for DVT treatment 2/5. Repeat blood and urine cultures sent 2/9 to follow up previous cultures. They were positive for enterococcus and medicine recommended treatment with 5 days of PO amoxicillin. On 2/13 the patients hemoglobin was 6.9 and, after the medical physician obtained consent from the patients sister, Sherry Hampton, 1 unit of pRBC was transfused without complication. Hgb rose appropriately to 8.3. On 2/14 during morning rounds patients feeding tube was noted to be dislodged. IR was consulted for replacement and on 2/14 a 24 F gastrostomy tube was successfully replaced into the stomach. Tube feeds were transitioned to bolus feeds, which patient tolerated without pain, nausea, or vomiting.  An abdominal binder was placed by RN.   PT/OT evaluated patient throughout admission and recommendations were consistently for SNF or long term care facility. PT/OT signed off on 1/18 with patient regression and poor tolerance for therapies. PT/OT reconsulted 2/7 and recommended long term care facility and signed off again.   On POD 02/05/22 the patient  was stable for  discharge to SNF [facility] in [place]).  I called the patient's person of contact, her Sherry Hampton, at 48 AM on 02/05/2022 to notify her of the patient's transfer to the nursing facility.  She voiced understanding.  Patient may follow up in our office with Dr. Harlow Asa as below.   I was involved in a portion of this patient's care, but did not see her throughout her entire hospitalization. Therefore, the above information was obtained mostly from chart review.   Physical exam- General: Pleasantly confused female laying on hospital bed, chronically ill appearing  HEENT: head -normocephalic, atraumatic; Eyes: pupils equal and round, anicteric sclerae Neck- Trachea is midline, no thyromegaly or JVD appreciated.  CV- RRR Pulm- breathing is non-labored ORA Abd- soft, ostomy with stool in pouch, MF clean, G-tube in place site c/d/I, clamped. GU- deferred  MSK- UE/LE symmetrical  Neuro- CN II-XII grossly in tact, no paresthesias. Psych- oriented to self and hospital, not oriented to time. Skin: warm and dry, no rashes or lesions  Allergies as of 02/05/2022   No Known Allergies      Medication List     STOP taking these medications    aspirin EC 81 MG tablet   Biotin 1000 MCG tablet   Cholecalciferol 125 MCG (5000 UT) capsule   methotrexate 2.5 MG tablet   VITAMIN B-12 PO       TAKE these medications    acetaminophen 160 MG/5ML solution Commonly known as: TYLENOL Place 15.6-31.3 mLs (500-1,000 mg total) into feeding tube every 6 (six) hours as needed for mild pain, fever or headache.   amoxicillin 250 MG/5ML suspension Commonly known as: AMOXIL Place 10 mLs (500 mg total) into feeding tube every 8 (eight) hours for 2 days.   apixaban 5 MG Tabs tablet Commonly known as: ELIQUIS Place 1 tablet (5 mg total) into feeding tube 2 (two) times daily.   chlorhexidine 0.12 % solution Commonly known as: PERIDEX 15 mLs by Mouth Rinse route 2 (two) times daily.   feeding  supplement (OSMOLITE 1.5 CAL) Liqd Place 120 mLs into feeding tube 4 (four) times daily.   ferrous sulfate 300 (60 Fe) MG/5ML syrup Place 3.7 mLs (220 mg total) into feeding tube 2 (two) times daily with a meal.   folic acid 1 MG tablet Commonly known as: FOLVITE Place 1 tablet (1 mg total) into feeding tube daily. Start taking on: February 06, 2022 What changed:  medication strength how much to take how to take this   free water Soln Place 200 mLs into feeding tube every 6 (six) hours.   insulin aspart 100 UNIT/ML injection Commonly known as: novoLOG Inject 0-9 Units into the skin every 4 (four) hours.   lip balm ointment Apply 1 application topically as needed for lip care (cold sores).   mouth rinse Liqd solution 15 mLs by Mouth Rinse route 2 times daily at 12 noon and 4 pm.   multivitamin Liqd Place 15 mLs into feeding tube daily. Start taking on: February 06, 2022   niacin 100 MG tablet Place 1 tablet (100 mg total) into feeding tube at bedtime.   pantoprazole sodium 40 mg Commonly known as: PROTONIX Place 40 mg into feeding tube daily.   psyllium 95 % Pack Commonly known as: HYDROCIL/METAMUCIL Take 1 packet by mouth 2 (two) times daily.   pyridoxine 100 MG tablet Commonly known as: B-6 Place 1 tablet (100 mg total) into feeding tube daily. Start taking on: February 06, 2022  rosuvastatin 10 MG tablet Commonly known as: CRESTOR Place 1 tablet (10 mg total) into feeding tube at bedtime. What changed: how to take this   thiamine 100 MG tablet Place 1 tablet (100 mg total) into feeding tube 2 (two) times daily.          Follow-up Information     [name], MD. Go on 03/12/2022.   Specialty: General Surgery Why: at 11:15 AM for post-opreative follow up. please arrive 30 minutes early. Contact information: [place] [address] [phone]                 Signed: Obie Dredge, [facility]  Surgery 02/05/2022, 10:51 AM Please see Amion for pager number during day hours 7:00am-4:30pm

## 2022-01-27 NOTE — Progress Notes (Cosign Needed)
Patient refused mouth care. Agitated and stated we were getting on her nerves.

## 2022-01-27 NOTE — Plan of Care (Signed)
  Problem: Pain Managment: Goal: General experience of comfort will improve Outcome: Progressing   Problem: Coping: Goal: Level of anxiety will decrease Outcome: Progressing   

## 2022-01-27 NOTE — Progress Notes (Signed)
Called and updated patient's sister, [name], by phone.   Norm Parcel, [facility] Surgery 01/27/2022, 11:17 AM Please see Amion for pager number during day hours 7:00am-4:30pm

## 2022-01-27 NOTE — Progress Notes (Signed)
Progress Note  3 Days Post-Op  Subjective: Patient with no acute change, appears to be tolerating TF @ goal rate and having ileostomy output.   Objective: Vital signs in last 24 hours: Temp:  [98.7 F (37.1 C)-100 F (37.8 C)] 100 F (37.8 C) (02/06 0920) Pulse Rate:  [90-102] 90 (02/06 0910) Resp:  [16-20] 20 (02/06 0910) BP: (109-131)/(71-79) 126/77 (02/06 0910) SpO2:  [98 %-99 %] 98 % (02/06 0910) Weight:  [75.7 kg] 75.7 kg (02/06 0510) Last BM Date: 01/27/22  Intake/Output from previous day: 02/05 0701 - 02/06 0700 In: 1374.1 [I.V.:151; NG/GT:1103.2] Out: 1000 [Urine:600; Stool:400] Intake/Output this shift: Total I/O In: 8 [Other:8] Out: -   PE: General: resting, NAD Heart: regular Resp: normal work of breathing Abdomen: soft, nondistended, nontender to palpation. Midline wound with 3.5 cm tract superiorly with purulent drainage. Ileostomy productive. 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 PEG in LUQ with TF running   Lab Results:  Recent Labs    01/26/22 0829 01/27/22 0409  WBC 12.0* 11.0*  HGB 8.7* 7.9*  HCT 29.8* 26.6*  PLT 305 282   BMET Recent Labs    01/26/22 0829 01/27/22 0409  NA 139  --   K 3.9 3.7  CL 109  --   CO2 25  --   GLUCOSE 128*  --   BUN 6*  --   CREATININE 0.37*  --   CALCIUM 9.1  --    PT/INR No results for input(s): LABPROT, INR in the last 72 hours. CMP     Component Value Date/Time   NA 139 01/26/2022 0829   K 3.7 01/27/2022 0409   CL 109 01/26/2022 0829   CO2 25 01/26/2022 0829   GLUCOSE 128 (H) 01/26/2022 0829   BUN 6 (L) 01/26/2022 0829   CREATININE 0.37 (L) 01/26/2022 0829   CALCIUM 9.1 01/26/2022 0829   CALCIUM 10.9 (H) 01/01/2022 0449   PROT 7.5 01/20/2022 0428   ALBUMIN 1.7 (L) 01/24/2022 0054   AST 17 01/20/2022 0428   ALT 16 01/20/2022 0428   ALKPHOS 64 01/20/2022 0428   BILITOT 0.5 01/20/2022 0428   GFRNONAA >60 01/26/2022 0829   Lipase     Component Value Date/Time   LIPASE 22 11/17/2021  0955       Studies/Results: No results found.  Anti-infectives: Anti-infectives (From admission, onward)    Start     Dose/Rate Route Frequency Ordered Stop   01/01/22 2100  piperacillin-tazobactam (ZOSYN) IVPB 3.375 g  Status:  Discontinued        3.375 g 12.5 mL/hr over 240 Minutes Intravenous Every 8 hours 01/01/22 2042 01/10/22 1333   11/25/21 1000  piperacillin-tazobactam (ZOSYN) IVPB 3.375 g  Status:  Discontinued        3.375 g 12.5 mL/hr over 240 Minutes Intravenous Every 8 hours 11/25/21 0909 12/01/21 0822   11/17/21 1830  piperacillin-tazobactam (ZOSYN) IVPB 3.375 g        3.375 g 12.5 mL/hr over 240 Minutes Intravenous Every 8 hours 11/17/21 1415 11/22/21 2359   11/17/21 1230  piperacillin-tazobactam (ZOSYN) IVPB 3.375 g        3.375 g 100 mL/hr over 30 Minutes Intravenous  Once 11/17/21 1220 11/17/21 1304        Assessment/Plan 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 Harlow Asa 11/27 - path: necrotic colon at site of perforation, no malignancy or dysplasia noted  - Ileostomy output improved.  Continue BID metamucil.  - WOC following for ileostomy, mucous fistula - Wound improved with healthy granulation tissue present, no tracts or purulent drainage present on dressing change this AM. Daily WTD dressing  - Pathology benign. CT distal descending/proximal sigmoid bowel luminal collapse. - CT 1/11 - some extravasated oral contrast in the subcutaneous abdominal wall superior and lateral to the ostomy at this level. There is inflammatory tissue at the level of the ostomy extending superiorly abutting the inferior tip of the liver and fundus of the gallbladder similar to the prior examination. There is likely some early fluid collection development anterior to the right kidney image 7/31 measuring 1.9 by 1.4 by 3.7 cm. Previously described fluid collections near the ostomy are not as well  defined on the current study. 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 There is no free intraperitoneal air or ascites. Small air-fluid collection at the level of the umbilicus is again seen containing air. This collection measures 2.5 x 0.7 by 1.3 cm and has mildly increased in size. Air in the collection is new from prior. - no collection around the ileostomy that is drainable - the best way to try to resolve this is abx and improving nutrition - zosyn completed for last CT scan findings and UTI on 1/20 - Dr. Hassell Done stopped her TNA on 1/17 - at this time, the patient has no medical need for TNA as she has a functional GI tract.  Parenteral nutrition carries many long-term risks  and enteral nutrition is superior; therefore, oral intake or intake via other means such as g-tube, would be needed henceforth.    -palliative saw the patient and it sounds like she is good with a feeding tube and thinks that is what her sister would want  - repeat CT 2/1: resolution in RLQ stranding and resolving hematoma/seroma, bilateral common femoral DVTs - Family consented to PEG placement 2/2 and this was done 2/3. TF at goal.   Bilateral femoral DVTs - noted on repeat CT AP 2/1, on eliquis   FEN - Dysphagia 2 diet/but won't eat; PEG placed 2/3 and patient is tolerating TF at goal    VTE - SCDs, eliquis ID - zosyn 11/27>12/2, 12/5>12/11, restarted 1/11> 1/20 Foley - None currently    - encephalopathy/FTT -  On folic acid and thiamine supplementation. Appreciate TRH following as well.   - psych saw 12/27 and determined patient does not have capacity   - frequent urination - Urine Cx 1/10 with enterococcus faecalis sensitive to ampicillin, Zosyn completed. Urine Cx ordered 1/26 with multiple species, reordered 1/30 with same, afebrile.   Dispo: unclear at this time  LOS: 71 days   I reviewed nursing  notes, hospitalist notes, last 24 h vitals and pain scores, last 48 h intake and output, and last 24 h labs and trends.  This care required moderate level of medical decision making.    Norm Parcel, [facility] Surgery 01/27/2022, 9:56 AM Please see Amion for pager number during day hours 7:00am-4:30pm

## 2022-01-27 NOTE — Progress Notes (Signed)
PROGRESS NOTE    Sherry Hampton  DGU:[id number] DOB: Aug 11, 1956 DOA: 11/17/2021 PCP: Pcp, No    No chief complaint on file.   Brief Narrative: 66 years old female with history of hyperlipidemia and mild memory loss presented to hospital with acute abnormal pain.  She was noted to have perforated viscus of the ascending colon with fecal peritonitis and required right colectomy and ileostomy.  Hospital course was complicated by fluid collection in the abdomen malnutrition progressive decline and encephalopathy.  TRH was consulted for assistance with general medical management.     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 Active Problems:   Perforated abdominal viscus   GERD (gastroesophageal reflux disease)   Hyperlipidemia   Hyponatremia   Acute blood loss anemia   Acute metabolic encephalopathy   Protein-calorie malnutrition, severe   Delirium   Ileostomy in place [facility])   Abnormal neurological exam   Memory loss   Hypercalcemia   Folate deficiency   Common bile duct dilatation   Enterococcus UTI   Goals of care, counseling/discussion   Vitamin B6 deficiency   Fecal peritonitis ([place])   Acute bilateral deep vein thrombosis (DVT) of femoral veins (HCC)   Hypophosphatemia  #1 perforated abdominal viscus -Status post exploratory laparotomy with left colectomy and end ileostomy. -Per primary surgical team.  2.  Enterococcus UTI -Resolved -Status post completion of antibiotics recently.  3.  Episode of fever -Currently afebrile with T-max of 100 today but has since trended down. -Repeat blood cultures from 01/19/2022 with no growth x3 days and 1 bottle 1 bottle with staph epidermis likely contaminant, blood cultures from 01/17/2022 negative x5 days. -Chest x-ray done 01/16/2022 negative for any acute infiltrate. -Status post PICC line removal. -Patient recently completed course of  antibiotics. -Dr. Louanne Belton spoke with Dr. Candiss Norse ID on 01/19/2022 who recommended repeat blood cultures which have been negative so far. -Continue to monitor off antibiotics.  4.  Mild transaminitis -Improved.  5.  Acute metabolic encephalopathy  -MRI brain 12/11/2021 with no acute findings with repeat MRI 01/05/2022 negative. -EEG done showed slowing consistent with encephalopathy. -Neurology consulted during the hospitalization has since signed off. -Continue vitamin B6, folic acid.  6.  Vitamin B6 deficiency/folic acid deficiency -Continue supplementation.   7.  Hypomagnesemia/hypokalemia/hypophosphatemia -Patient placed on oral potassium supplementation per primary team.   -Phosphorus at 2.5 -Magnesium at 1.9.  Potassium 3.7.  8.  Hypercalcemia -Improved. -PTH RP less than 2.  9.  Severe protein calorie malnutrition/failure to thrive/poor oral intake -Primary team recommended PEG tube family, palliative care discussed with family. -Patient seen by ethics committee. -IR consulted for PEG tube placement early on, patient being followed by general surgery and general surgery recommended PEG tube placement which was done on 01/24/2022 on endoscopy.  -Patient started on tube feeds.  -Per primary team.  10.  Bilateral femoral vein DVT -Was on IV heparin and has been transitioned to Eliquis.  10.  Goals of care -Palliative care following.    DVT prophylaxis: IV heparin>>>>> Eliquis Code Status: Full Family Communication: No family at bedside. Disposition:   Status is: Inpatient Remains inpatient appropriate because: Severity of illness           Consultants:  Consult: Dr. Cathlean Sauer 1/30-31/2023 [facility] Palliative care Psychiatry Neurology  Procedures:  Status post exploratory laparotomy with right colectomy and ileostomy on 11/17/2021 PEG tube placement 01/24/2022  Antimicrobials:  Anti-infectives (From admission, onward)  Start     Dose/Rate Route  Frequency Ordered Stop   01/01/22 2100  piperacillin-tazobactam (ZOSYN) IVPB 3.375 g  Status:  Discontinued        3.375 g 12.5 mL/hr over 240 Minutes Intravenous Every 8 hours 01/01/22 2042 01/10/22 1333   11/25/21 1000  piperacillin-tazobactam (ZOSYN) IVPB 3.375 g  Status:  Discontinued        3.375 g 12.5 mL/hr over 240 Minutes Intravenous Every 8 hours 11/25/21 0909 12/01/21 0822   11/17/21 1830  piperacillin-tazobactam (ZOSYN) IVPB 3.375 g        3.375 g 12.5 mL/hr over 240 Minutes Intravenous Every 8 hours 11/17/21 1415 11/22/21 2359   11/17/21 1230  piperacillin-tazobactam (ZOSYN) IVPB 3.375 g        3.375 g 100 mL/hr over 30 Minutes Intravenous  Once 11/17/21 1220 11/17/21 1304         Subjective: Sleeping but arousable.  Denies any chest pain.  Denies any shortness of breath.   Objective: Vitals:   01/27/22 0510 01/27/22 0910 01/27/22 0920 01/27/22 1201  BP: 131/72 126/77  119/71  Pulse: 99 90  96  Resp: 18 20  19   Temp: 98.7 F (37.1 C) 99.1 F (37.3 C) 100 F (37.8 C) (!) 97.3 F (36.3 C)  TempSrc: Oral Oral Axillary Oral  SpO2: 99% 98%  100%  Weight: 75.7 kg     Height:        Intake/Output Summary (Last 24 hours) at 01/27/2022 1306 Last data filed at 01/27/2022 1000 Gross per 24 hour  Intake 1582.13 ml  Output 900 ml  Net 682.13 ml    Filed Weights   01/24/22 0653 01/26/22 0457 01/27/22 0510  Weight: 79.7 kg 77.3 kg 75.7 kg    Examination:  General exam: Frail.  Deconditioned. Respiratory system: Lungs clear to auscultation bilaterally anterior lung fields..  No wheezes, no crackles, no rhonchi.  Normal respiratory effort. Cardiovascular system: Regular rate and rhythm no murmurs rubs or gallop.  No JVD.  No lower extremity edema.  Gastrointestinal system: Abdomen is soft, nontender, nondistended.  Ileostomy in place.  Midline incision. 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  PEG tube in place.  Abdominal binder.  Normal bowel  sounds heard. Central nervous system: Sleeping but arousable.. No focal neurological deficits. Extremities: Symmetric 5 x 5 power. Skin: No rashes, lesions or ulcers Psychiatry: Judgement and insight appear poor to fair. Mood & affect appropriate.     Data Reviewed: I have personally reviewed following labs and imaging studies  CBC: Recent Labs  Lab 01/23/22 0446 01/24/22 0054 01/25/22 0523 01/26/22 0829 01/27/22 0409  WBC 6.3 5.5 9.5 12.0* 11.0*  HGB 9.1* 8.0* 9.2* 8.7* 7.9*  HCT 30.9* 28.0* 31.9* 29.8* 26.6*  MCV 88.3 89.2 88.1 87.9 86.4  PLT 322 283 285 305 282     Basic Metabolic Panel: Recent Labs  Lab 01/21/22 0357 01/22/22 0434 01/23/22 0446 01/24/22 0054 01/25/22 1211 01/26/22 0829 01/27/22 0409  NA 140 141  --  141  --  139  --   K 3.4* 3.5  --  3.2* 4.4 3.9 3.7  CL 111 112*  --  112*  --  109  --   CO2 26 24  --  25  --  25  --   GLUCOSE 105* 105*  --  106*  --  128*  --   BUN 5* 6*  --  7*  --  6*  --  CREATININE 0.31* <0.30*  --  0.34*  --  0.37*  --   CALCIUM 9.7 10.2  --  9.8  --  9.1  --   MG 1.9 1.8 1.9 1.8 1.9 2.0 1.9  PHOS  --   --   --  2.6 2.2* 3.5 2.5     GFR: Estimated Creatinine Clearance: 73.8 mL/min (A) (by C-G formula based on SCr of 0.37 mg/dL (L)).  Liver Function Tests: Recent Labs  Lab 01/24/22 0054  ALBUMIN 1.7*     CBG: Recent Labs  Lab 01/26/22 1943 01/26/22 2333 01/27/22 0403 01/27/22 0723 01/27/22 1106  GLUCAP 127* 128* 140* 135* 128*      Recent Results (from the past 240 hour(s))  Culture, blood (routine x 2)     Status: Abnormal   Collection Time: 01/19/22 11:27 AM   Specimen: BLOOD  Result Value Ref Range Status   Specimen Description   Final    BLOOD BLOOD RIGHT HAND Performed at [facility] [address], [place], [place] [address]    Special Requests   Final    BOTTLES DRAWN AEROBIC ONLY Blood Culture adequate volume Performed at [facility]  [address], [place], [place] [address]    Culture  Setup Time   Final    GRAM POSITIVE COCCI IN CLUSTERS AEROBIC BOTTLE ONLY CRITICAL VALUE NOTED.  VALUE IS CONSISTENT WITH PREVIOUSLY REPORTED AND CALLED VALUE. Performed at [facility], [place] [address] [place], [place] [address]    Culture STAPHYLOCOCCUS EPIDERMIDIS (A)  Final   Report Status 01/22/2022 FINAL  Final   Organism ID, Bacteria STAPHYLOCOCCUS EPIDERMIDIS  Final      Susceptibility   Staphylococcus epidermidis - MIC*    CIPROFLOXACIN >=8 RESISTANT Resistant     ERYTHROMYCIN >=8 RESISTANT Resistant     GENTAMICIN <=0.5 SENSITIVE Sensitive     OXACILLIN >=4 RESISTANT Resistant     TETRACYCLINE 2 SENSITIVE Sensitive     VANCOMYCIN 1 SENSITIVE Sensitive     TRIMETH/SULFA 80 RESISTANT Resistant     CLINDAMYCIN >=8 RESISTANT Resistant     RIFAMPIN <=0.5 SENSITIVE Sensitive     Inducible Clindamycin NEGATIVE Sensitive     * STAPHYLOCOCCUS EPIDERMIDIS  Culture, blood (routine x 2)     Status: None   Collection Time: 01/19/22 11:33 AM   Specimen: BLOOD  Result Value Ref Range Status   Specimen Description   Final    BLOOD BLOOD LEFT HAND Performed at [facility] [address], [place], [place] [address]    Special Requests   Final    BOTTLES DRAWN AEROBIC ONLY Blood Culture results may not be optimal due to an inadequate volume of blood received in culture bottles Performed at [facility] [address], [place], [place] [address]    Culture   Final    NO GROWTH 5 DAYS Performed at [facility], [place] [address] [place], [place] [address]    Report Status 01/24/2022 FINAL  Final  Urine Culture     Status: Abnormal   Collection Time: 01/20/22 12:41 PM   Specimen: In/Out Cath Urine  Result Value Ref Range Status   Specimen Description   Final    IN/OUT CATH URINE Performed at [facility] [address], [place], [place] [address]    Special  Requests   Final    NONE Performed at [facility], [place]  [place], [place] [address]    Culture MULTIPLE SPECIES PRESENT, SUGGEST RECOLLECTION (A)  Final   Report Status 01/21/2022 FINAL  Final          Radiology Studies: No results found.      Scheduled Meds:  apixaban  10 mg Per Tube BID   Followed by   Derrill Memo ON 02/02/2022] apixaban  5 mg Per Tube BID   chlorhexidine  15 mL Mouth Rinse BID   ferrous sulfate  220 mg Per Tube BID WC   folic acid  1 mg Per Tube Daily   insulin aspart  0-9 Units Subcutaneous Q4H   mouth rinse  15 mL Mouth Rinse q12n4p   multivitamin with minerals  1 tablet Per Tube Daily   niacin  100 mg Per Tube QHS   pantoprazole sodium  40 mg Per Tube Daily   phosphorus  500 mg Per Tube BID   psyllium  1 packet Oral BID   vitamin B-6  100 mg Per Tube Daily   thiamine  100 mg Per Tube BID   Continuous Infusions:  sodium chloride 10 mL/hr at 01/26/22 0415   feeding supplement (OSMOLITE 1.5 CAL) 1,000 mL (01/27/22 0957)     LOS: 71 days    Time spent: 35 minutes    Irine Seal, MD [facility]   To contact the attending provider between 7A-7P or the covering provider during after hours 7P-7A, please log into the web site [email or web address] and access using universal [place] password for that web site. If you do not have the password, please call the hospital operator.  01/27/2022, 1:06 PM

## 2022-01-28 LAB — COMPREHENSIVE METABOLIC PANEL
ALT: 15 U/L (ref 0–44)
AST: 19 U/L (ref 15–41)
Albumin: 1.8 g/dL — ABNORMAL LOW (ref 3.5–5.0)
Alkaline Phosphatase: 78 U/L (ref 38–126)
Anion gap: 3 — ABNORMAL LOW (ref 5–15)
BUN: 8 mg/dL (ref 8–23)
CO2: 28 mmol/L (ref 22–32)
Calcium: 9.7 mg/dL (ref 8.9–10.3)
Chloride: 107 mmol/L (ref 98–111)
Creatinine, Ser: 0.33 mg/dL — ABNORMAL LOW (ref 0.44–1.00)
GFR, Estimated: >60 mL/min (ref >60)
Glucose, Bld: 144 mg/dL — ABNORMAL HIGH (ref 70–99)
Potassium: 4 mmol/L (ref 3.5–5.1)
Sodium: 138 mmol/L (ref 135–145)
Total Bilirubin: 0.1 mg/dL — ABNORMAL LOW (ref 0.3–1.2)
Total Protein: 7.7 g/dL (ref 6.5–8.1)

## 2022-01-28 LAB — CBC
HCT: 29 % — ABNORMAL LOW (ref 36.0–46.0)
Hemoglobin: 8.6 g/dL — ABNORMAL LOW (ref 12.0–15.0)
MCH: 25.9 pg — ABNORMAL LOW (ref 26.0–34.0)
MCHC: 29.7 g/dL — ABNORMAL LOW (ref 30.0–36.0)
MCV: 87.3 fL (ref 80.0–100.0)
Platelets: 324 10*3/uL (ref 150–400)
RBC: 3.32 MIL/uL — ABNORMAL LOW (ref 3.87–5.11)
RDW: 18.5 % — ABNORMAL HIGH (ref 11.5–15.5)
WBC: 11.4 10*3/uL — ABNORMAL HIGH (ref 4.0–10.5)
nRBC: 0 % (ref 0.0–0.2)

## 2022-01-28 LAB — MAGNESIUM: Magnesium: 1.8 mg/dL (ref 1.7–2.4)

## 2022-01-28 LAB — GLUCOSE, CAPILLARY
Glucose-Capillary: 149 mg/dL — ABNORMAL HIGH (ref 70–99)
Glucose-Capillary: 139 mg/dL — ABNORMAL HIGH (ref 70–99)
Glucose-Capillary: 126 mg/dL — ABNORMAL HIGH (ref 70–99)
Glucose-Capillary: 140 mg/dL — ABNORMAL HIGH (ref 70–99)
Glucose-Capillary: 149 mg/dL — ABNORMAL HIGH (ref 70–99)

## 2022-01-28 LAB — PHOSPHORUS: Phosphorus: 2 mg/dL — ABNORMAL LOW (ref 2.5–4.6)

## 2022-01-28 MED ORDER — POTASSIUM PHOSPHATES 15 MMOLE/5ML IV SOLN
15.0000 mmol | Freq: Once | INTRAVENOUS | Status: AC
  Administered 2022-01-29: 15 mmol via INTRAVENOUS
  Filled 2022-01-28 (×2): qty 5

## 2022-01-28 MED ORDER — MAGNESIUM SULFATE IN D5W 1-5 GM/100ML-% IV SOLN
1.0000 g | Freq: Once | INTRAVENOUS | Status: AC
  Administered 2022-01-28: 1 g via INTRAVENOUS
  Filled 2022-01-28: qty 100

## 2022-01-28 NOTE — Evaluation (Addendum)
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 Harlow Asa 11/27.  Per recent MD notes: "Encephalopathy/FTT - Patient has been on folic acid and thiamine supplements. Noted to have poor motor control and coordination the other day by PT which is different from last week per therapy notes, Neurology consulted - appreciated their input. No further workup needed at this time from their standpoint.    - psych saw 12/27 and determined patient does not have capacity. Patient has been worsening since admission.  She was confused from POD 1 on. Question if she was like this prior to admission, but lived alone and no one aware of these issues." Rapid response called 1/12/ for AMS.On 01/22/22, found to have bilateral femoral DVTs on CT scan patient has been at hosptial for extended time with implementation of PEG tube on 2/3.   Clinical Impression   Patient was reevaluated on this date for skilled OT services with recent PEG tube placement and DVTs in BLE. Patient continues to require TD for ADLs at this time with poor ability to follow one step commands with increased time. Patient remains alert to only self. Patient expressed/calls out in  increased pain with minimal movements at this time.Patient demonstrated no new learning on this date. This patient is well known to this therapist with patient having been signed off on for skilled OT services on  01/08/22.patient remains at the same level as prior to OT d/c.  Patient will require long term care at time of discharge. Therapist recommends consideration of palliative care at this time.OT services signing off at this time.      Recommendations for follow up therapy are one component of a multi-disciplinary discharge  planning process, led by the attending physician.  Recommendations may be updated based on patient status, additional functional criteria and insurance authorization.   Follow Up Recommendations  Long-term institutional care without follow-up therapy    Assistance Recommended at Discharge Frequent or constant Supervision/Assistance  Patient can return home with the following A lot of help with bathing/dressing/bathroom;Two people to help with bathing/dressing/bathroom;Two people to help with walking and/or transfers;A lot of help with walking and/or transfers;Assistance with cooking/housework;Assistance with feeding;Direct supervision/assist for medications management    Functional Status Assessment  Patient has had a recent decline in their functional status and/or demonstrates limited ability to make significant improvements in function in a reasonable and predictable amount of time  Equipment Recommendations       Recommendations for Other Services       Precautions / Restrictions Precautions Precautions: Fall Precaution Comments: abdominal surgery,PEG tube placement, RUQ ileostomy, Painful left leg, paresis right U/LE, bialteral DVTs (01/22/22) Restrictions Weight Bearing Restrictions: No      Mobility Bed Mobility Overal bed mobility: Needs Assistance             General bed mobility comments: TD for repositioning in bed to midline positioning to reduce pressure on high contact areas.    Transfers                          Balance  ADL either performed or assessed with clinical judgement   ADL   Eating/Feeding: Total assistance;Bed level Eating/Feeding Details (indicate cue type and reason): PEG tube in place at this time Grooming: Total assistance;Bed level   Upper Body Bathing: Total assistance;Bed level   Lower Body Bathing: Total assistance;Bed level   Upper Body Dressing : Total  assistance;Bed level   Lower Body Dressing: Total assistance;Bed level   Toilet Transfer: +2 for safety/equipment;+2 for physical assistance Toilet Transfer Details (indicate cue type and reason): unable to participate in movement at this time. Toileting- Clothing Manipulation and Hygiene: Total assistance;Bed level       Functional mobility during ADLs: +2 for physical assistance;+2 for safety/equipment       Vision Baseline Vision/History: 1 Wears glasses Additional Comments: unable to assess with patients cogntiive deficits. able to track voice and turn head towards speakers     Perception     Praxis      Pertinent Vitals/Pain Pain Assessment Pain Assessment: Faces Faces Pain Scale: Hurts whole lot Pain Location: BLE, BUE and trunk with movement. Pain Intervention(s): Monitored during session, Limited activity within patient's tolerance     Hand Dominance Right   Extremity/Trunk Assessment Upper Extremity Assessment Upper Extremity Assessment: LUE deficits/detail;RUE deficits/detail;Difficult to assess due to impaired cognition RUE Deficits / Details: Patient able to participate in PROM about 20 degrees at shoulder level with noted grimancing. patient was able to tolerate PROM of elbow to 10 degrees away from neutral. patient was resistive to ROM at times on RUE. patient noted to still have muscle twitching with pronation and supination of UE. digits able to ROM to neutral wtih AAROM LUE Deficits / Details: Patient noted to have grasp on items with no following of commands. patient able to AROM elbow when intrinsicly motivated. . unable to follow MMT.   Lower Extremity Assessment Lower Extremity Assessment: Defer to PT evaluation   Cervical / Trunk Assessment Cervical / Trunk Exceptions: listing to R side in bed with grimaning with repositioning   Communication Communication Communication: No difficulties   Cognition Arousal/Alertness: Awake/alert Behavior During  Therapy: Flat affect Overall Cognitive Status: Impaired/Different from baseline Area of Impairment: Orientation, Attention, Memory, Following commands, Safety/judgement, Awareness, Problem solving                 Orientation Level: Disoriented to, Time, Situation, Place Current Attention Level: Focused Memory: Decreased recall of precautions, Decreased short-term memory Following Commands: Follows one step commands inconsistently Safety/Judgement: Decreased awareness of deficits Awareness: Intellectual Problem Solving: Slow processing, Decreased initiation, Difficulty sequencing, Requires verbal cues, Requires tactile cues General Comments: patient     General Comments       Exercises     Shoulder Instructions      Home Living Family/patient expects to be discharged to:: Private residence Living Arrangements: Alone Available Help at Discharge: Other (Comment) (no family in area) Type of Home: Apartment Home Access: Stairs to enter [employer] of Steps: 1 curb +7 Entrance Stairs-Rails: Left Home Layout: One level     Bathroom Shower/Tub: Teacher, early years/pre: Standard Bathroom Accessibility: Yes   Home Equipment: Conservation officer, nature (2 wheels);Cane - single point   Additional Comments: Has Hand held shower but not installed. States that maintenance can install for her. Has DME from her mother who has since passed away. Pt has never used.  Pt uses a small wheeled cart to push groceries and laundry to/from apartment.      Prior Functioning/Environment Prior Level of  Function : Independent/Modified Independent;Driving             Mobility Comments: pt reports fully independent with mobility and ADL's PTA. PT does endorse "2 to 3 falls when I'm carrying my laundry basket down the stairs" this past year.          OT Problem List:        OT Treatment/Interventions:      OT Goals(Current goals can be found in the care plan section) Acute  Rehab OT Goals OT Goal Formulation: All assessment and education complete, DC therapy  OT Frequency:      Co-evaluation PT/OT/SLP Co-Evaluation/Treatment: Yes Reason for Co-Treatment: For patient/therapist safety;To address functional/ADL transfers PT goals addressed during session: Mobility/safety with mobility OT goals addressed during session: ADL's and self-care      AM-PAC OT "6 Clicks" Daily Activity     Outcome Measure Help from another person eating meals?: Total Help from another person taking care of personal grooming?: Total Help from another person toileting, which includes using toliet, bedpan, or urinal?: Total Help from another person bathing (including washing, rinsing, drying)?: Total Help from another person to put on and taking off regular upper body clothing?: Total Help from another person to put on and taking off regular lower body clothing?: Total 6 Click Score: 6   End of Session Nurse Communication: Other (comment) (pain levels)  Activity Tolerance: Patient limited by pain;Patient limited by fatigue Patient left: in bed;with call bell/phone within reach;with bed alarm set;with nursing/sitter in room  OT Visit Diagnosis: Unsteadiness on feet (R26.81);Other symptoms and signs involving cognitive function;Pain;History of falling (Z91.81);Repeated falls (R29.6);Muscle weakness (generalized) (M62.81)                Time: 0071-2197 OT Time Calculation (min): 21 min Charges:  OT General Charges $OT Visit: 1 Visit OT Evaluation $OT Eval Moderate Complexity: 1 Mod  Jackelyn Poling OTR/L, MS Acute Rehabilitation Department Office# [phone] Pager# [phone]   Marcellina Millin 01/28/2022, 1:51 PM

## 2022-01-28 NOTE — Evaluation (Signed)
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 Harlow Asa 11/27.  Per recent MD notes: "Encephalopathy/FTT - Patient has been on folic acid and thiamine supplements. Noted to have poor motor control and coordination the other day by PT which is different from last week per therapy notes, Neurology consulted - appreciated their input. No further workup needed at this time from their standpoint.    - psych saw 12/27 and determined patient does not have capacity. Patient has been worsening since admission.  She was confused from POD 1 on. Question if she was like this prior to admission, but lived alone and no one aware of these issues." Rapid response called 1/12/ for AMS.On 01/22/22, found to have bilateral femoral DVTs on CT scan patient has been at hosptial for extended time with implementation of PEG tube on 2/3.  Clinical Impression  Bed level eval only. Pt only alert to self. She cannot follow 1 step commands consistently. Unable to assess LE ROM, strength due to significant/severe pain with any and all mobility attempts-may benefit from palliative care consult. Pt continues to exhibit presentation of possible neurological involvement (R ankle clonus, R UE hypertonicity,L ankle foot drop/flaccidity). Unfortunately, pt does not appear appropriate for acute PT services at this time 2* inability to meaningfully participate. Will sign off.      Recommendations for follow up therapy are one component of a multi-disciplinary discharge planning process, led by the attending physician.  Recommendations may be updated based on patient status, additional functional criteria and insurance authorization.  Follow Up Recommendations Long-term institutional care without  follow-up therapy    Assistance Recommended at Discharge Frequent or constant Supervision/Assistance  Patient can return home with the following       Equipment Recommendations None recommended by PT  Recommendations for Other Services       Functional Status Assessment Patient has had a recent decline in their functional status and/or demonstrates limited ability to make significant improvements in function in a reasonable and predictable amount of time     Precautions / Restrictions Precautions Precautions: Fall Precaution Comments: abdominal surgery,PEG tube placement, RUQ ileostomy, painful LEs, bialteral DVTs (01/22/22) Restrictions Weight Bearing Restrictions: No      Mobility  Bed Mobility Overal bed mobility: Needs Assistance             General bed mobility comments: Totally dependent for trunk repositioning to midline to reduce pressure on high contact areas. Pt unable to tolerate any movement without crying out/moaning in pain    Transfers                        Ambulation/Gait                  Stairs            Wheelchair Mobility    Modified Rankin (Stroke Patients Only)       Balance                                             Pertinent Vitals/Pain Pain Assessment Pain Assessment: Faces Faces Pain Scale: Hurts worst Pain Location: BLE,  BUE and trunk with movement. Pain Descriptors / Indicators: Grimacing, Moaning, Guarding Pain Intervention(s): Limited activity within patient's tolerance, Monitored during session    Home Living Family/patient expects to be discharged to:: Unsure Living Arrangements: Alone Available Help at Discharge: Other (Comment) (no family in area) Type of Home: Apartment Home Access: Stairs to enter Entrance Stairs-Rails: Left Entrance Stairs-Number of Steps: 1 curb +7   Home Layout: One level Home Equipment: Conservation officer, nature (2 wheels);Cane - single point Additional  Comments: Has Hand held shower but not installed. States that maintenance can install for her. Has DME from her mother who has since passed away. Pt has never used.  Pt uses a small wheeled cart to push groceries and laundry to/from apartment.    Prior Function Prior Level of Function : Independent/Modified Independent;Driving (prior to admission 11/17/21)             Mobility Comments: was ambulatory on initial PT eval 11/18/21; +2 assist since 11/22/21; essentially bedbound since 12/27/21       Hand Dominance   Dominant Hand: Right    Extremity/Trunk Assessment       Lower Extremity Assessment Lower Extremity Assessment: Defer to PT evaluation RLE Deficits / Details: Very limited assessment 2* significant/severe pain. Clonus observed R ankle. RLE Sensation: decreased proprioception;decreased light touch RLE Coordination: decreased fine motor;decreased gross motor LLE Deficits / Details: Very limited assessment 2* significant/severe pain. L ankle/foot appears flaccid LLE Sensation: decreased light touch;decreased proprioception LLE Coordination: decreased fine motor;decreased gross motor    Cervical / Trunk Assessment Cervical / Trunk Exceptions: listing to R side in bed  Communication   Communication: No difficulties  Cognition Arousal/Alertness: Awake/alert Behavior During Therapy: Flat affect Overall Cognitive Status: Impaired/Different from baseline Area of Impairment: Orientation, Attention, Memory, Following commands, Safety/judgement, Awareness, Problem solving                 Orientation Level: Disoriented to, Time, Situation, Place   Memory: Decreased recall of precautions, Decreased short-term memory Following Commands: Follows one step commands inconsistently Safety/Judgement: Decreased awareness of deficits   Problem Solving: Slow processing, Decreased initiation, Difficulty sequencing, Requires verbal cues, Requires tactile cues          General  Comments      Exercises     Assessment/Plan    PT Assessment Patient does not need any further PT services  PT Problem List         PT Treatment Interventions      PT Goals (Current goals can be found in the Care Plan section)  Acute Rehab PT Goals Patient Stated Goal: pt unable to participate PT Goal Formulation: Patient unable to participate in goal setting    Frequency       Co-evaluation PT/OT/SLP Co-Evaluation/Treatment: Yes Reason for Co-Treatment: Complexity of the patient's impairments (multi-system involvement);For patient/therapist safety PT goals addressed during session: Mobility/safety with mobility OT goals addressed during session: ADL's and self-care       AM-PAC PT "6 Clicks" Mobility  Outcome Measure Help needed turning from your back to your side while in a flat bed without using bedrails?: Total Help needed moving from lying on your back to sitting on the side of a flat bed without using bedrails?: Total Help needed moving to and from a bed to a chair (including a wheelchair)?: Total Help needed standing up from a chair using your arms (e.g., wheelchair or bedside chair)?: Total Help needed to walk in hospital room?: Total Help needed climbing 3-5 steps  with a railing? : Total 6 Click Score: 6    End of Session   Activity Tolerance: Patient limited by fatigue;Patient limited by pain Patient left: in bed;with call bell/phone within reach   PT Visit Diagnosis: Pain;Muscle weakness (generalized) (M62.81);Other symptoms and signs involving the nervous system (R29.898);Adult, failure to thrive (R62.7)    Time: 2595-6387 PT Time Calculation (min) (ACUTE ONLY): 21 min   Charges:   PT Evaluation $PT Eval Moderate Complexity: 1 Mod        Doreatha Massed, PT Acute Rehabilitation  Office: [phone] Pager: [phone]

## 2022-01-28 NOTE — Progress Notes (Signed)
PROGRESS NOTE    Sherry Hampton  DJS:[id number] DOB: [birth year] DOA: 11/17/2021 PCP: Pcp, No    No chief complaint on file.   Brief Narrative: 66 years old female with history of hyperlipidemia and mild memory loss presented to hospital with acute abnormal pain.  She was noted to have perforated viscus of the ascending colon with fecal peritonitis and required right colectomy and ileostomy.  Hospital course was complicated by fluid collection in the abdomen malnutrition progressive decline and encephalopathy.  TRH was consulted for assistance with general medical management.     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 Active Problems:   Perforated abdominal viscus   GERD (gastroesophageal reflux disease)   Hyperlipidemia   Hyponatremia   Acute blood loss anemia   Acute metabolic encephalopathy   Protein-calorie malnutrition, severe   Delirium   Ileostomy in place [facility])   Abnormal neurological exam   Memory loss   Hypercalcemia   Folate deficiency   Common bile duct dilatation   Enterococcus UTI   Goals of care, counseling/discussion   Vitamin B6 deficiency   Fecal peritonitis ([place])   Acute bilateral deep vein thrombosis (DVT) of femoral veins (HCC)   Hypophosphatemia  #1 perforated abdominal viscus -Status post exploratory laparotomy with left colectomy and end ileostomy. -Per primary surgical team.  2.  Enterococcus UTI -Resolved -Status post completion of antibiotics recently.  3.  Episode of fever -Currently afebrile with T-max of 100 today but has since trended down. -Repeat blood cultures from 01/19/2022 with no growth x3 days and 1 bottle 1 bottle with staph epidermis likely contaminant, blood cultures from 01/17/2022 negative x5 days. -Chest x-ray done 01/16/2022 negative for any acute infiltrate. -Status post PICC line removal. -Patient recently completed course of  antibiotics. -Dr. Louanne Belton spoke with Dr. Candiss Norse ID on 01/19/2022 who recommended repeat blood cultures which have been negative so far. -Continue to monitor off antibiotics.  4.  Mild transaminitis -Improved.  5.  Acute metabolic encephalopathy  -MRI brain 12/11/2021 with no acute findings with repeat MRI 01/05/2022 negative. -EEG done showed slowing consistent with encephalopathy. -Neurology consulted during the hospitalization has since signed off. -Continue vitamin B6, folic acid.  6.  Vitamin B6 deficiency/folic acid deficiency -Continue supplementation.   7.  Hypomagnesemia/hypokalemia/hypophosphatemia -Patient placed on oral potassium supplementation per primary team.   -Phosphorus at 2.0 -Magnesium at 1.8.  Potassium 4.0. -Magnesium sulfate 2 g IV x1. -Continue K-Phos neutral tablets. -K-Phos 15 mmol IV x1 ordered per primary team.  8.  Hypercalcemia -Improved. -PTH RP less than 2.  9.  Severe protein calorie malnutrition/failure to thrive/poor oral intake -Primary team recommended PEG tube family, palliative care discussed with family. -Patient seen by ethics committee. -IR consulted for PEG tube placement early on, patient being followed by general surgery and general surgery recommended PEG tube placement which was done on 01/24/2022 on endoscopy.  -Patient started on tube feeds.  -Per primary team.  10.  Bilateral femoral vein DVT -Was on IV heparin and has been transitioned to Eliquis.   10.  Goals of care -Palliative care following.    DVT prophylaxis: IV heparin>>>>> Eliquis Code Status: Full Family Communication: No family at bedside. Disposition:   Status is: Inpatient Remains inpatient appropriate because: Severity of illness           Consultants:  Consult: Dr. Cathlean Sauer 1/30-31/2023 [facility] Palliative care Psychiatry Neurology  Procedures:  Status post exploratory  laparotomy with right colectomy and ileostomy on 11/17/2021 PEG  tube placement 01/24/2022  Antimicrobials:  Anti-infectives (From admission, onward)    Start     Dose/Rate Route Frequency Ordered Stop   01/01/22 2100  piperacillin-tazobactam (ZOSYN) IVPB 3.375 g  Status:  Discontinued        3.375 g 12.5 mL/hr over 240 Minutes Intravenous Every 8 hours 01/01/22 2042 01/10/22 1333   11/25/21 1000  piperacillin-tazobactam (ZOSYN) IVPB 3.375 g  Status:  Discontinued        3.375 g 12.5 mL/hr over 240 Minutes Intravenous Every 8 hours 11/25/21 0909 12/01/21 0822   11/17/21 1830  piperacillin-tazobactam (ZOSYN) IVPB 3.375 g        3.375 g 12.5 mL/hr over 240 Minutes Intravenous Every 8 hours 11/17/21 1415 11/22/21 2359   11/17/21 1230  piperacillin-tazobactam (ZOSYN) IVPB 3.375 g        3.375 g 100 mL/hr over 30 Minutes Intravenous  Once 11/17/21 1220 11/17/21 1304         Subjective: Sleeping comfortably, however arousable.  No chest pain.  No shortness of breath.   Objective: Vitals:   01/27/22 1811 01/27/22 2115 01/28/22 0443 01/28/22 0652  BP: 137/72 123/73 120/72   Pulse: 88 (!) 104 (!) 110 98  Resp: 18 18 18    Temp: 99.8 F (37.7 C) 99.6 F (37.6 C) 98.8 F (37.1 C)   TempSrc: Axillary Oral Oral   SpO2: 100% 99% 99%   Weight:      Height:        Intake/Output Summary (Last 24 hours) at 01/28/2022 1039 Last data filed at 01/28/2022 0800 Gross per 24 hour  Intake 1103.33 ml  Output 2185 ml  Net -1081.67 ml    Filed Weights   01/24/22 0653 01/26/22 0457 01/27/22 0510  Weight: 79.7 kg 77.3 kg 75.7 kg    Examination:  General exam: Deconditioned.  Frail. Respiratory system: CTA B anterior lung fields.  No wheezes, no crackles, no rhonchi.  Normal respiratory effort. Cardiovascular system: RRR no murmurs rubs or gallops.  No JVD.  No lower extremity edema.  Gastrointestinal system: Abdomen is soft, nontender, nondistended.  Ileostomy in place.  Midline incision with bandage on. Mucous fistula in place with no organomegaly or  masses felt.  PEG tube in place.  Abdominal binder.  Normal bowel sounds heard. Central nervous system: Sleeping but arousable.. No focal neurological deficits.  Moving extremities spontaneously. Extremities: Symmetric 5 x 5 power. Skin: No rashes, lesions or ulcers Psychiatry: Judgement and insight appear poor to fair. Mood & affect appropriate.     Data Reviewed: I have personally reviewed following labs and imaging studies  CBC: Recent Labs  Lab 01/24/22 0054 01/25/22 0523 01/26/22 0829 01/27/22 0409 01/28/22 0437  WBC 5.5 9.5 12.0* 11.0* 11.4*  HGB 8.0* 9.2* 8.7* 7.9* 8.6*  HCT 28.0* 31.9* 29.8* 26.6* 29.0*  MCV 89.2 88.1 87.9 86.4 87.3  PLT 283 285 305 282 324     Basic Metabolic Panel: Recent Labs  Lab 01/22/22 0434 01/23/22 0446 01/24/22 0054 01/25/22 1211 01/26/22 0829 01/27/22 0409 01/28/22 0437  NA 141  --  141  --  139  --  138  K 3.5  --  3.2* 4.4 3.9 3.7 4.0  CL 112*  --  112*  --  109  --  107  CO2 24  --  25  --  25  --  28  GLUCOSE 105*  --  106*  --  128*  --  144*  BUN 6*  --  7*  --  6*  --  8  CREATININE <0.30*  --  0.34*  --  0.37*  --  0.33*  CALCIUM 10.2  --  9.8  --  9.1  --  9.7  MG 1.8   < > 1.8 1.9 2.0 1.9 1.8  PHOS  --   --  2.6 2.2* 3.5 2.5 2.0*   < > = values in this interval not displayed.     GFR: Estimated Creatinine Clearance: 73.8 mL/min (A) (by C-G formula based on SCr of 0.33 mg/dL (L)).  Liver Function Tests: Recent Labs  Lab 01/24/22 0054 01/28/22 0437  AST  --  19  ALT  --  15  ALKPHOS  --  78  BILITOT  --  0.1*  PROT  --  7.7  ALBUMIN 1.7* 1.8*     CBG: Recent Labs  Lab 01/27/22 1541 01/27/22 1951 01/27/22 2319 01/28/22 0424 01/28/22 0740  GLUCAP 134* 113* 138* 149* 139*      Recent Results (from the past 240 hour(s))  Culture, blood (routine x 2)     Status: Abnormal   Collection Time: 01/19/22 11:27 AM   Specimen: BLOOD  Result Value Ref Range Status   Specimen Description   Final    BLOOD  BLOOD RIGHT HAND Performed at [facility], [place] [address] [place], [place] [address]    Special Requests   Final    BOTTLES DRAWN AEROBIC ONLY Blood Culture adequate volume Performed at [place] [address] [place], [place] [address]    Culture  Setup Time   Final    GRAM POSITIVE COCCI IN CLUSTERS AEROBIC BOTTLE ONLY CRITICAL VALUE NOTED.  VALUE IS CONSISTENT WITH PREVIOUSLY REPORTED AND CALLED VALUE. Performed at [facility], [place] [address] [place], [place] [address]    Culture STAPHYLOCOCCUS EPIDERMIDIS (A)  Final   Report Status 01/22/2022 FINAL  Final   Organism ID, Bacteria STAPHYLOCOCCUS EPIDERMIDIS  Final      Susceptibility   Staphylococcus epidermidis - MIC*    CIPROFLOXACIN >=8 RESISTANT Resistant     ERYTHROMYCIN >=8 RESISTANT Resistant     GENTAMICIN <=0.5 SENSITIVE Sensitive     OXACILLIN >=4 RESISTANT Resistant     TETRACYCLINE 2 SENSITIVE Sensitive     VANCOMYCIN 1 SENSITIVE Sensitive     TRIMETH/SULFA 80 RESISTANT Resistant     CLINDAMYCIN >=8 RESISTANT Resistant     RIFAMPIN <=0.5 SENSITIVE Sensitive     Inducible Clindamycin NEGATIVE Sensitive     * STAPHYLOCOCCUS EPIDERMIDIS  Culture, blood (routine x 2)     Status: None   Collection Time: 01/19/22 11:33 AM   Specimen: BLOOD  Result Value Ref Range Status   Specimen Description   Final    BLOOD BLOOD LEFT HAND Performed at [place] [address] [place], [place] [address]    Special Requests   Final    BOTTLES DRAWN AEROBIC ONLY Blood Culture results may not be optimal due to an inadequate volume of blood received in culture bottles Performed at [facility] [address], [place], [place] [address]    Culture   Final    NO GROWTH 5 DAYS Performed at [facility], [place] [address] [place], [place] [address]    Report Status 01/24/2022 FINAL  Final  Urine Culture     Status: Abnormal    Collection Time: 01/20/22  12:41 PM   Specimen: In/Out Cath Urine  Result Value Ref Range Status   Specimen Description   Final    IN/OUT CATH URINE Performed at [facility], [place] [address] [place], [place] [address]    Special Requests   Final    NONE Performed at [facility], [place] [address] [place],  [address]    Culture MULTIPLE SPECIES PRESENT, SUGGEST RECOLLECTION (A)  Final   Report Status 01/21/2022 FINAL  Final          Radiology Studies: No results found.      Scheduled Meds:  apixaban  10 mg Per Tube BID   Followed by   Derrill Memo ON 02/02/2022] apixaban  5 mg Per Tube BID   chlorhexidine  15 mL Mouth Rinse BID   ferrous sulfate  220 mg Per Tube BID WC   folic acid  1 mg Per Tube Daily   insulin aspart  0-9 Units Subcutaneous Q4H   mouth rinse  15 mL Mouth Rinse q12n4p   multivitamin with minerals  1 tablet Per Tube Daily   niacin  100 mg Per Tube QHS   pantoprazole sodium  40 mg Per Tube Daily   phosphorus  500 mg Per Tube BID   psyllium  1 packet Oral BID   vitamin B-6  100 mg Per Tube Daily   thiamine  100 mg Per Tube BID   Continuous Infusions:  sodium chloride 10 mL/hr at 01/26/22 0415   feeding supplement (OSMOLITE 1.5 CAL) 1,000 mL (01/28/22 0529)   magnesium sulfate bolus IVPB     potassium PHOSPHATE IVPB (in mmol)       LOS: 72 days    Time spent: 35 minutes    Irine Seal, MD [facility]   To contact the attending provider between 7A-7P or the covering provider during after hours 7P-7A, please log into the web site [email or web address] and access using universal [place] password for that web site. If you do not have the password, please call the hospital operator.  01/28/2022, 10:39 AM

## 2022-01-28 NOTE — Progress Notes (Signed)
Progress Note  4 Days Post-Op  Subjective: No significant change. I updated [name] by phone this AM.   Objective: Vital signs in last 24 hours: Temp:  [97.3 F (36.3 C)-100 F (37.8 C)] 98.8 F (37.1 C) (02/07 0443) Pulse Rate:  [88-110] 98 (02/07 0652) Resp:  [18-20] 18 (02/07 0443) BP: (119-137)/(71-77) 120/72 (02/07 0443) SpO2:  [98 %-100 %] 99 % (02/07 0443) Last BM Date: 01/27/21  Intake/Output from previous day: 02/06 0701 - 02/07 0700 In: 1311.3 [NG/GT:1103.3] Out: 2175 [Urine:1700; Stool:475] Intake/Output this shift: Total I/O In: -  Out: 110 [Stool:110]  PE: General: resting, NAD Heart: regular Resp: normal work of breathing Abdomen: soft, nondistended, nontender to palpation. Midline wound with 3.5 cm tract superiorly with no drainage. Ileostomy productive. 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 PEG in LUQ with TF running   Lab Results:  Recent Labs    01/27/22 0409 01/28/22 0437  WBC 11.0* 11.4*  HGB 7.9* 8.6*  HCT 26.6* 29.0*  PLT 282 324   BMET Recent Labs    01/26/22 0829 01/27/22 0409 01/28/22 0437  NA 139  --  138  K 3.9 3.7 4.0  CL 109  --  107  CO2 25  --  28  GLUCOSE 128*  --  144*  BUN 6*  --  8  CREATININE 0.37*  --  0.33*  CALCIUM 9.1  --  9.7   PT/INR No results for input(s): LABPROT, INR in the last 72 hours. CMP     Component Value Date/Time   NA 138 01/28/2022 0437   K 4.0 01/28/2022 0437   CL 107 01/28/2022 0437   CO2 28 01/28/2022 0437   GLUCOSE 144 (H) 01/28/2022 0437   BUN 8 01/28/2022 0437   CREATININE 0.33 (L) 01/28/2022 0437   CALCIUM 9.7 01/28/2022 0437   CALCIUM 10.9 (H) 01/01/2022 0449   PROT 7.7 01/28/2022 0437   ALBUMIN 1.8 (L) 01/28/2022 0437   AST 19 01/28/2022 0437   ALT 15 01/28/2022 0437   ALKPHOS 78 01/28/2022 0437   BILITOT 0.1 (L) 01/28/2022 0437   GFRNONAA >60 01/28/2022 0437   Lipase     Component Value Date/Time   LIPASE 22 11/17/2021 0955       Studies/Results: No results  found.  Anti-infectives: Anti-infectives (From admission, onward)    Start     Dose/Rate Route Frequency Ordered Stop   01/01/22 2100  piperacillin-tazobactam (ZOSYN) IVPB 3.375 g  Status:  Discontinued        3.375 g 12.5 mL/hr over 240 Minutes Intravenous Every 8 hours 01/01/22 2042 01/10/22 1333   11/25/21 1000  piperacillin-tazobactam (ZOSYN) IVPB 3.375 g  Status:  Discontinued        3.375 g 12.5 mL/hr over 240 Minutes Intravenous Every 8 hours 11/25/21 0909 12/01/21 0822   11/17/21 1830  piperacillin-tazobactam (ZOSYN) IVPB 3.375 g        3.375 g 12.5 mL/hr over 240 Minutes Intravenous Every 8 hours 11/17/21 1415 11/22/21 2359   11/17/21 1230  piperacillin-tazobactam (ZOSYN) IVPB 3.375 g        3.375 g 100 mL/hr over 30 Minutes Intravenous  Once 11/17/21 1220 11/17/21 1304        Assessment/Plan 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 Harlow Asa 11/27 - path: necrotic colon at site of perforation, no malignancy or dysplasia noted  - Ileostomy output improved. Continue BID metamucil.  - WOC  following for ileostomy, mucous fistula - Wound improved with healthy granulation tissue present, no tracts or purulent drainage present on dressing change this AM. Daily WTD dressing  - Pathology benign. CT distal descending/proximal sigmoid bowel luminal collapse. - CT 1/11 - some extravasated oral contrast in the subcutaneous abdominal wall superior and lateral to the ostomy at this level. There is inflammatory tissue at the level of the ostomy extending superiorly abutting the inferior tip of the liver and fundus of the gallbladder similar to the prior examination. There is likely some early fluid collection development anterior to the right kidney image 7/31 measuring 1.9 by 1.4 by 3.7 cm. Previously described fluid collections near the ostomy are not as well defined on the current study. Ill-defined omental  inflammation and fluid in the lower abdomen and left upper quadrant appears unchanged and may be related to omental infarct. There is no free intraperitoneal air or ascites. Small air-fluid collection at the level of the umbilicus is again seen containing air. This collection measures 2.5 x 0.7 by 1.3 cm and has mildly increased in size. Air in the collection is new from prior. - no collection around the ileostomy that is drainable - the best way to try to resolve this is abx and improving nutrition - zosyn completed for last CT scan findings and UTI on 1/20 - Dr. Hassell Done stopped her TNA on 1/17 - at this time, the patient has no medical need for TNA as she has a functional GI tract.  Parenteral nutrition carries many long-term risks  and enteral nutrition is superior; therefore, oral intake or intake via other means such as g-tube, would be needed henceforth.    - palliative saw the patient and it sounds like she is good with a feeding tube and thinks that is what her sister would want  - repeat CT 2/1: resolution in RLQ stranding and resolving hematoma/seroma, bilateral common femoral DVTs - Family consented to PEG placement 2/2 and this was done 2/3. TF at goal.   Bilateral femoral DVTs - noted on repeat CT AP 2/1, on eliquis   FEN - Dysphagia 2 diet/but won't eat; PEG placed 2/3 and patient is tolerating TF at goal, replace magnesium and phos today    VTE - SCDs, eliquis ID - zosyn 11/27>12/2, 12/5>12/11, restarted 1/11> 1/20 Foley - None currently    - encephalopathy/FTT -  On folic acid and thiamine supplementation. Appreciate TRH following as well.   - psych saw 12/27 and determined patient does not have capacity   - frequent urination - Urine Cx 1/10 with enterococcus faecalis sensitive to ampicillin, Zosyn completed. Urine Cx ordered 1/26 with multiple species, reordered 1/30 with same, afebrile.   Dispo: working on placement. Patient is medically stable for discharge at this point.  PT/OT to re-eval today.   LOS: 72 days   I reviewed hospitalist notes, last 24 h vitals and pain scores, last 48 h intake and output, and last 24 h labs and trends.  This care required low level of medical decision making.    Norm Parcel, [facility] Surgery 01/28/2022, 8:51 AM Please see Amion for pager number during day hours 7:00am-4:30pm

## 2022-01-28 NOTE — Consult Note (Addendum)
[place] Nurse ostomy follow up Surgical team is following for assessment and plan of care for abd wound.    Stoma type/location: RLQ ileostomy. Stoma is red and viable, 1 1/4 inch, slightly above skin level, 100cc liquid brown stool. Applied barrier ring and 2 piece pouch. Patient is not able to retain information. No family present. Unable to answer orientation questions and is confused; unable to teach about ostomy skills and she will require total assistance with pouch application and emptying after discharge. 4 sets of supplies and educational materials left in room.  Use supplies: barrier ring, Lawson # [phone], wafer Kellie Simmering # [phone], pouch Lawson # [phone] Enrolled patient in [place] Start Discharge program: Yes, previously   [place] Nurse ostomy follow up Stoma type/location: LUQ mucus fistula, stoma is red and viable, 1 1/8 inches, slightly above skin level,  10cc mucous drainage in the pouch.  Applied one piece pouch, Lawson # [phone].  4 extra pouches left at the bedside.   Julien Girt MSN, RN, [place], [place], [place]

## 2022-01-28 NOTE — TOC Progression Note (Addendum)
Transition of Care [facility]) - Progression Note    Patient Details  Name: Sherry Hampton MRN: [id number] Date of Birth: 1956/04/01  Transition of Care [facility]) CM/SW Contact  Lennart Pall, LCSW Phone Number: 01/28/2022, 2:55 PM  Clinical Narrative:    Have submitted pt's updated FL2 out to all area facilities with no confirmed bed offers as of yet.  Continue to follow.  Addendum: Have spoken with pt's sister, Sherry Hampton, to discuss current status and SNF bed search.  Have explained to her that we are seeking SNF bed locally and primary concern at this point is pt's level of debility and requiring total care which will make securing insurance auth difficult.  Given this concern, and because one facility has already expressed concern that pt will be long term care, we need to begin a [place] Medicaid application.  Unfortunately, no family expects to be back in [place] until the beginning of March.  Will work to figure out how we can start this app with no local family to assist and get this started ASAP.     Expected Discharge Plan: [place] Barriers to Discharge: Continued Medical Work up, Ship broker, SNF Pending bed offer  Expected Discharge Plan and Services Expected Discharge Plan: [place] In-house Referral: Clinical Social Work     Living arrangements for the past 2 months: Single Family Home                                       Social Determinants of Health (SDOH) Interventions    Readmission Risk Interventions No flowsheet data found.

## 2022-01-29 DIAGNOSIS — R509 Fever, unspecified: Secondary | ICD-10-CM

## 2022-01-29 DIAGNOSIS — Z931 Gastrostomy status: Secondary | ICD-10-CM

## 2022-01-29 LAB — GLUCOSE, CAPILLARY
Glucose-Capillary: 116 mg/dL — ABNORMAL HIGH (ref 70–99)
Glucose-Capillary: 120 mg/dL — ABNORMAL HIGH (ref 70–99)
Glucose-Capillary: 134 mg/dL — ABNORMAL HIGH (ref 70–99)
Glucose-Capillary: 136 mg/dL — ABNORMAL HIGH (ref 70–99)
Glucose-Capillary: 138 mg/dL — ABNORMAL HIGH (ref 70–99)
Glucose-Capillary: 147 mg/dL — ABNORMAL HIGH (ref 70–99)
Glucose-Capillary: 147 mg/dL — ABNORMAL HIGH (ref 70–99)

## 2022-01-29 LAB — POTASSIUM: Potassium: 4.1 mmol/L (ref 3.5–5.1)

## 2022-01-29 LAB — PHOSPHORUS: Phosphorus: 2.3 mg/dL — ABNORMAL LOW (ref 2.5–4.6)

## 2022-01-29 LAB — MAGNESIUM: Magnesium: 2.1 mg/dL (ref 1.7–2.4)

## 2022-01-29 MED ORDER — ADULT MULTIVITAMIN LIQUID CH
15.0000 mL | Freq: Every day | ORAL | Status: DC
  Administered 2022-01-29 – 2022-02-05 (×7): 15 mL
  Filled 2022-01-29 (×8): qty 15

## 2022-01-29 NOTE — Assessment & Plan Note (Addendum)
2/3 upper endoscopy with PEG See ethics note from 1/31 by Dr. Doyle Askew tube came out 2/14, IR to replace 2/14, pending

## 2022-01-29 NOTE — TOC Progression Note (Signed)
Transition of Care [facility]) - Progression Note    Patient Details  Name: Sherry Hampton MRN: [id number] Date of Birth: 12-Sep-1956  Transition of Care [facility]) CM/SW Contact  Lennart Pall, LCSW Phone Number: 01/29/2022, 8:56 AM  Clinical Narrative:    After speaking with pt's sister, [name], yesterday I received the following text message from pt's brother, [name]:  "First question what led to her being in this position? Why was she what happened that you had to have surgery open her up and now she has all these bags and tubes going so she can just you know deficit you know herself"  Again, asked that brother please direct his these questions to physicians.   Expected Discharge Plan: [place] Barriers to Discharge: Continued Medical Work up, Ship broker, SNF Pending bed offer  Expected Discharge Plan and Services Expected Discharge Plan: [place] In-house Referral: Clinical Social Work     Living arrangements for the past 2 months: Single Family Home                                       Social Determinants of Health (SDOH) Interventions    Readmission Risk Interventions No flowsheet data found.

## 2022-01-29 NOTE — Assessment & Plan Note (Addendum)
Currently on eliquis Noted on CT 2/1 abd/pelvis

## 2022-01-29 NOTE — Progress Notes (Addendum)
PROGRESS NOTE    Sherry Hampton  IRS:[id number] DOB: 1956-05-27 DOA: 11/17/2021 PCP: Pcp, No  No chief complaint on file.   Brief Narrative:  66 year old woman according to chart was independent, living alone and driving and continue to work as needed.  PMH including hyperlipidemia, possibly some memory loss, presented 11/27 with sudden abdominal pain.  Underwent emergent surgery for perforated viscus of the ascending colon with fecal peritonitis same day.  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  Hospitalization has been complicated by fluid collections in the abdomen, malnutrition, currently on TNA, intermittent encephalopathy and failure to thrive and progressive decline in strength and ability to participate with physical therapy.    See initial consult note from Dr. [name] on 1/7 for additional details.  Completed course of antibiotics for enterococcus UTI and intraabdominal and subcutaneous fluid collections.  She remains deconditioned with abnormal neuro exam and encephalopathy.  Status post PEG tube placement.  Noted to have bilateral femoral DVTs on anticoagulation with Eliquis.  Medically stable.  Placement pending.     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 Active Problems:   Perforated abdominal viscus   Goals of care, counseling/discussion   Acute metabolic encephalopathy   Abnormal neurological exam   Acute bilateral deep vein thrombosis (DVT) of femoral veins (HCC)   Fever   S/P percutaneous endoscopic gastrostomy (PEG) tube placement (HCC)   Hypercalcemia   Folate deficiency   Vitamin B6 deficiency   Protein-calorie malnutrition, severe   Common bile duct dilatation   Enterococcus UTI   Memory loss   Delirium   GERD (gastroesophageal reflux disease)   Hyperlipidemia   Hyponatremia   Acute  blood loss anemia   Ileostomy in place [facility])   Fecal peritonitis (HCC)   Hypophosphatemia   Assessment and Plan: Perforated abdominal viscus- (present on admission) --per surgery S/p ex lap, extended right colectomy, end ileostomy, mucous fistula 11/17/2021 CT 2/1 with near complete resolution of heterogenous fat stranding and fluid in low right hemiabdomen with residual tubular and bandlike elements in vicinity (c/w resolving post op hematoma/seroma with fat necrosis)  Abnormal neurological exam Exam difficult, inconsistent MRI c spine recommended 12/21, MRI c spine with C6-7 severe bilateral neural foraminal narrowing, C5-6 severe L and mild R enural foraminal narrowing, C3-4 and C4-5 mild to moderate L neural foraminal narrowing MRI brain without acute abnormality MRI T and L spine done as well without findings that I think would explain her weakness, reviewed with neuro [place] wnl, vitamin E (wnl), vitamin b1 (elevated), and vitamin b6 (low).  Vitamin D wnl. Continue folate and niacin supplementation per neuro Supplement B6 EEG with generalized slowing EMG/NCS will need to be outpatient Discuss with neuro again as needed   Acute metabolic encephalopathy- (present on admission) alert, but obviously confused See 12/28/2021 initial consult note [name]) for review of the chart and extensive work-up. Previously with normal ammonia, TSH, HIV S/p high dose thiamine, continue supplemenation MRI brain 12/21 without acute abnormality Repeat MRI brain 1/15 without acute findings EEG generalized slowing c/w encephalopathy MRI C spine with C6-7 severe bilateral neural foraminal narrowing, C5-6 severe L and mild R neural foraminal narrowing, C3-4 and C4-5 mild to moderate L neural foraminal narrowing Neurology evaluated and delirium thought 2/2 prolonged hospitalization, sleep wake disturbance, nutritional def, critical illness Continued encephalopathy multifactorial, related to infection  (above - now on  abx), prolonged hospitalization, nutritional def, critical illness --- follow with antibiotics, nutrition, etc B6 and folic acid supplementation Delirium precautions    S/P percutaneous endoscopic gastrostomy (PEG) tube placement ([place]) 2/3 upper endoscopy with PEG See ethics note from 1/31 by Dr. Cathlean Sauer  Fever Temp 100 on 2/6 1 of 2 cx with staph epi from 1/29 (aerobic only) 1 of 2 cx with staph epi from 1/27 (aerobic only) Discussed with ID, noted likely contaminant, but can repeat cultures - will repeat  Acute bilateral deep vein thrombosis (DVT) of femoral veins (HCC) Currently on eliquis Noted on CT 2/1 abd/pelvis  Vitamin B6 deficiency replace  Folate deficiency supplementation  Hypercalcemia Calcium 9.7, corrects to 11.5 Continue IVF and follow PTH 44 -> suggests non parathyroid hypercalcemia follow PTHrp (low), vitamin D 1, 25 (slightly low)  Enterococcus UTI S/p treatment  Common bile duct dilatation Noted incidentally on CT scan Bili and alk phos wnl Will defer additional imaging to surgery  Protein-calorie malnutrition, severe Continue nutrition as per surgery  Memory loss According to the chart mild prior to admission, patient was independent and still working at times   DVT prophylaxis: eliquis Code Status: full Family Communication: none Disposition:   Status is: Inpatient Remains inpatient appropriate because: no safe discharge plan   Consultants Surgery is primary, [facility] is consulting Palliative care Psych neurology  Procedures:  Status post exploratory laparotomy with right colectomy and ileostomy on 11/17/2021 PEG tube placement 01/24/2022  Antimicrobials:  Anti-infectives (From admission, onward)    Start     Dose/Rate Route Frequency Ordered Stop   01/01/22 2100  piperacillin-tazobactam (ZOSYN) IVPB 3.375 g  Status:  Discontinued        3.375 g 12.5 mL/hr over 240 Minutes Intravenous Every 8 hours 01/01/22 2042  01/10/22 1333   11/25/21 1000  piperacillin-tazobactam (ZOSYN) IVPB 3.375 g  Status:  Discontinued        3.375 g 12.5 mL/hr over 240 Minutes Intravenous Every 8 hours 11/25/21 0909 12/01/21 0822   11/17/21 1830  piperacillin-tazobactam (ZOSYN) IVPB 3.375 g        3.375 g 12.5 mL/hr over 240 Minutes Intravenous Every 8 hours 11/17/21 1415 11/22/21 2359   11/17/21 1230  piperacillin-tazobactam (ZOSYN) IVPB 3.375 g        3.375 g 100 mL/hr over 30 Minutes Intravenous  Once 11/17/21 1220 11/17/21 1304       Subjective: Confused, difficult to communicate with   Objective: Vitals:   01/28/22 1328 01/28/22 2148 01/29/22 0545 01/29/22 1339  BP: 112/65 115/79 103/68 130/80  Pulse: 100 (!) 103 96 (!) 101  Resp: _0 Temp: 98.9 F (37.2 C) 98.8 F (37.1 C) 98 F (36.7 C) 97.7 F (36.5 C)  TempSrc: Axillary Oral  Oral  SpO2: 98% 99% 100% 100%  Weight:      Height:        Intake/Output Summary (Last 24 hours) at 01/29/2022 1937 Last data filed at 01/29/2022 1800 Gross per 24 hour  Intake 1949.41 ml  Output 1445 ml  Net 504.41 ml   Filed Weights   01/24/22 0653 01/26/22 0457 01/27/22 0510  Weight: 79.7 kg 77.3 kg 75.7 kg    Examination:  General exam: Appears calm and comfortable  Respiratory system: unlabored Cardiovascular system: RRR Gastrointestinal system: peg, ostomy, midline surgical dressing, mucous fistula Central nervous system: alert, but confused - doesn't consistently follow commands Extremities: bilateral LE edema  Data Reviewed: I have personally reviewed following labs and imaging  studies  CBC: Recent Labs  Lab 01/24/22 0054 01/25/22 0523 01/26/22 0829 01/27/22 0409 01/28/22 0437  WBC 5.5 9.5 12.0* 11.0* 11.4*  HGB 8.0* 9.2* 8.7* 7.9* 8.6*  HCT 28.0* 31.9* 29.8* 26.6* 29.0*  MCV 89.2 88.1 87.9 86.4 87.3  PLT 283 285 305 282 353    Basic Metabolic Panel: Recent Labs  Lab 01/24/22 0054 01/25/22 1211 01/26/22 0829 01/27/22 0409  01/28/22 0437 01/29/22 0418  NA 141  --  139  --  138  --   K 3.2* 4.4 3.9 3.7 4.0 4.1  CL 112*  --  109  --  107  --   CO2 25  --  25  --  28  --   GLUCOSE 106*  --  128*  --  144*  --   BUN 7*  --  6*  --  8  --   CREATININE 0.34*  --  0.37*  --  0.33*  --   CALCIUM 9.8  --  9.1  --  9.7  --   MG 1.8 1.9 2.0 1.9 1.8 2.1  PHOS 2.6 2.2* 3.5 2.5 2.0* 2.3*    GFR: Estimated Creatinine Clearance: 73.8 mL/min (Woodie Degraffenreid) (by C-G formula based on SCr of 0.33 mg/dL (L)).  Liver Function Tests: Recent Labs  Lab 01/24/22 0054 01/28/22 0437  AST  --  19  ALT  --  15  ALKPHOS  --  78  BILITOT  --  0.1*  PROT  --  7.7  ALBUMIN 1.7* 1.8*    CBG: Recent Labs  Lab 01/29/22 0006 01/29/22 0357 01/29/22 0745 01/29/22 1142 01/29/22 1614  GLUCAP 147* 138* 134* 120* 116*     Recent Results (from the past 240 hour(s))  Urine Culture     Status: Abnormal   Collection Time: 01/20/22 12:41 PM   Specimen: In/Out Cath Urine  Result Value Ref Range Status   Specimen Description   Final    IN/OUT CATH URINE Performed at [facility], [place] [address], [place], [place] [address]    Special Requests   Final    NONE Performed at [facility], [place] [address], [place], [place] [address]    Culture MULTIPLE SPECIES PRESENT, SUGGEST RECOLLECTION (Huy Majid)  Final   Report Status 01/21/2022 FINAL  Final         Radiology Studies: No results found.      Scheduled Meds:  apixaban  10 mg Per Tube BID   Followed by   Derrill Memo ON 02/02/2022] apixaban  5 mg Per Tube BID   chlorhexidine  15 mL Mouth Rinse BID   ferrous sulfate  220 mg Per Tube BID WC   folic acid  1 mg Per Tube Daily   insulin aspart  0-9 Units Subcutaneous Q4H   mouth rinse  15 mL Mouth Rinse q12n4p   multivitamin  15 mL Per Tube Daily   niacin  100 mg Per Tube QHS   pantoprazole sodium  40 mg Per Tube Daily   psyllium  1 packet Oral BID   vitamin B-6  100 mg Per Tube Daily   thiamine  100  mg Per Tube BID   Continuous Infusions:  sodium chloride 10 mL/hr at 01/28/22 1109   feeding supplement (OSMOLITE 1.5 CAL) 1,000 mL (01/29/22 1504)   potassium PHOSPHATE IVPB (in mmol) 15 mmol (01/29/22 1514)     LOS: 73 days    Time spent: over 30 min    Marcelline Deist  Florene Glen, MD [facility]   To contact the attending provider between 7A-7P or the covering provider during after hours 7P-7A, please log into the web site [email or web address] and access using universal [place] password for that web site. If you do not have the password, please call the hospital operator.  01/29/2022, 7:37 PM

## 2022-01-29 NOTE — Assessment & Plan Note (Addendum)
Temp 100 on 2/6 1 of 2 cx with staph epi from 1/29 (aerobic only) 1 of 2 cx with staph epi from 1/27 (aerobic only) 2/8 blood cx NGTD x2 days Repeat urine cx pending per surgery - enterococcus sensitive to ampicillin, will treat with amoxicillin x5 days

## 2022-01-29 NOTE — Progress Notes (Signed)
Progress Note  5 Days Post-Op  Subjective: No acute changes. I called and updated her sister, [name], this morning.   Objective: Vital signs in last 24 hours: Temp:  [98 F (36.7 C)-98.9 F (37.2 C)] 98 F (36.7 C) (02/08 0545) Pulse Rate:  [96-103] 96 (02/08 0545) Resp:  [14-18] 14 (02/08 0545) BP: (103-115)/(65-79) 103/68 (02/08 0545) SpO2:  [98 %-100 %] 100 % (02/08 0545) Last BM Date: 01/29/22  Intake/Output from previous day: 02/07 0701 - 02/08 0700 In: 1838.4 [I.V.:178.4; NG/GT:1560; IV Piggyback:100] Out: 1705 [Urine:1250; Stool:455] Intake/Output this shift: No intake/output data recorded.  PE: General: resting, NAD Heart: regular Resp: normal work of breathing Abdomen: soft, nondistended, nontender to palpation. Midline wound with 3 cm tract superiorly with no drainage. Ileostomy productive. 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 PEG in LUQ with TF running   Lab Results:  Recent Labs    01/27/22 0409 01/28/22 0437  WBC 11.0* 11.4*  HGB 7.9* 8.6*  HCT 26.6* 29.0*  PLT 282 324   BMET Recent Labs    01/28/22 0437 01/29/22 0418  NA 138  --   K 4.0 4.1  CL 107  --   CO2 28  --   GLUCOSE 144*  --   BUN 8  --   CREATININE 0.33*  --   CALCIUM 9.7  --    PT/INR No results for input(s): LABPROT, INR in the last 72 hours. CMP     Component Value Date/Time   NA 138 01/28/2022 0437   K 4.1 01/29/2022 0418   CL 107 01/28/2022 0437   CO2 28 01/28/2022 0437   GLUCOSE 144 (H) 01/28/2022 0437   BUN 8 01/28/2022 0437   CREATININE 0.33 (L) 01/28/2022 0437   CALCIUM 9.7 01/28/2022 0437   CALCIUM 10.9 (H) 01/01/2022 0449   PROT 7.7 01/28/2022 0437   ALBUMIN 1.8 (L) 01/28/2022 0437   AST 19 01/28/2022 0437   ALT 15 01/28/2022 0437   ALKPHOS 78 01/28/2022 0437   BILITOT 0.1 (L) 01/28/2022 0437   GFRNONAA >60 01/28/2022 0437   Lipase     Component Value Date/Time   LIPASE 22 11/17/2021 0955       Studies/Results: No results  found.  Anti-infectives: Anti-infectives (From admission, onward)    Start     Dose/Rate Route Frequency Ordered Stop   01/01/22 2100  piperacillin-tazobactam (ZOSYN) IVPB 3.375 g  Status:  Discontinued        3.375 g 12.5 mL/hr over 240 Minutes Intravenous Every 8 hours 01/01/22 2042 01/10/22 1333   11/25/21 1000  piperacillin-tazobactam (ZOSYN) IVPB 3.375 g  Status:  Discontinued        3.375 g 12.5 mL/hr over 240 Minutes Intravenous Every 8 hours 11/25/21 0909 12/01/21 0822   11/17/21 1830  piperacillin-tazobactam (ZOSYN) IVPB 3.375 g        3.375 g 12.5 mL/hr over 240 Minutes Intravenous Every 8 hours 11/17/21 1415 11/22/21 2359   11/17/21 1230  piperacillin-tazobactam (ZOSYN) IVPB 3.375 g        3.375 g 100 mL/hr over 30 Minutes Intravenous  Once 11/17/21 1220 11/17/21 1304        Assessment/Plan 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 Harlow Asa 11/27 - path: necrotic colon at site of perforation, no malignancy or dysplasia noted  - Ileostomy output improved. Continue BID metamucil.  - WOC following for ileostomy, mucous fistula - Wound improved with  healthy granulation tissue present, no tracts or purulent drainage present on dressing change this AM. Daily WTD dressing  - Pathology benign. CT distal descending/proximal sigmoid bowel luminal collapse. - CT 1/11 - some extravasated oral contrast in the subcutaneous abdominal wall superior and lateral to the ostomy at this level. There is inflammatory tissue at the level of the ostomy extending superiorly abutting the inferior tip of the liver and fundus of the gallbladder similar to the prior examination. There is likely some early fluid collection development anterior to the right kidney image 7/31 measuring 1.9 by 1.4 by 3.7 cm. Previously described fluid collections near the ostomy are not as well defined on the current study. Ill-defined omental  inflammation and fluid in the lower abdomen and left upper quadrant appears unchanged and may be related to omental infarct. There is no free intraperitoneal air or ascites. Small air-fluid collection at the level of the umbilicus is again seen containing air. This collection measures 2.5 x 0.7 by 1.3 cm and has mildly increased in size. Air in the collection is new from prior. - no collection around the ileostomy that is drainable - the best way to try to resolve this is abx and improving nutrition - zosyn completed for last CT scan findings and UTI on 1/20 - Dr. Hassell Done stopped her TNA on 1/17 - at this time, the patient has no medical need for TNA as she has a functional GI tract.  Parenteral nutrition carries many long-term risks  and enteral nutrition is superior; therefore, oral intake or intake via other means such as g-tube, would be needed henceforth.    - palliative saw the patient and it sounds like she is good with a feeding tube and thinks that is what her sister would want  - repeat CT 2/1: resolution in RLQ stranding and resolving hematoma/seroma, bilateral common femoral DVTs - Family consented to PEG placement 2/2 and this was done 2/3. TF at goal.   Bilateral femoral DVTs - noted on repeat CT AP 2/1, on eliquis   FEN - Dysphagia 2 diet/but won't eat; PEG placed 2/3 and patient is tolerating TF at goal, replace phos today    VTE - SCDs, eliquis ID - zosyn 11/27>12/2, 12/5>12/11, restarted 1/11> 1/20 Foley - None currently    - encephalopathy/FTT -  On folic acid and thiamine supplementation. Appreciate TRH following as well.   - psych saw 12/27 and determined patient does not have capacity   - frequent urination - Urine Cx 1/10 with enterococcus faecalis sensitive to ampicillin, Zosyn completed. Urine Cx ordered 1/26 with multiple species, reordered 1/30 with same, afebrile.   Dispo: working on placement. Patient is medically stable for discharge at this point. PT/OT have  signed off. While I understand that patient is not able to participate well with therapies, I am not sure how appropriate it is not to continue to evaluate periodically especially now that malnutrition can gradually begin to improve.   LOS: 73 days   I reviewed hospitalist notes, last 24 h vitals and pain scores, last 48 h intake and output, last 24 h labs and trends, and TOC and therapy notes .  This care required low level of medical decision making.    Norm Parcel, [facility] 01/29/2022, 9:16 AM Please see Amion for pager number during day hours 7:00am-4:30pm

## 2022-01-30 LAB — COMPREHENSIVE METABOLIC PANEL
ALT: 14 U/L (ref 0–44)
AST: 19 U/L (ref 15–41)
Albumin: 1.8 g/dL — ABNORMAL LOW (ref 3.5–5.0)
Alkaline Phosphatase: 83 U/L (ref 38–126)
Anion gap: 3 — ABNORMAL LOW (ref 5–15)
BUN: 11 mg/dL (ref 8–23)
CO2: 29 mmol/L (ref 22–32)
Calcium: 9.6 mg/dL (ref 8.9–10.3)
Chloride: 102 mmol/L (ref 98–111)
Creatinine, Ser: 0.34 mg/dL — ABNORMAL LOW (ref 0.44–1.00)
GFR, Estimated: >60 mL/min (ref >60)
Glucose, Bld: 139 mg/dL — ABNORMAL HIGH (ref 70–99)
Potassium: 4.3 mmol/L (ref 3.5–5.1)
Sodium: 134 mmol/L — ABNORMAL LOW (ref 135–145)
Total Bilirubin: <0.1 mg/dL — ABNORMAL LOW (ref 0.3–1.2)
Total Protein: 8 g/dL (ref 6.5–8.1)

## 2022-01-30 LAB — CBC WITH DIFFERENTIAL/PLATELET
Abs Immature Granulocytes: 0.04 10*3/uL (ref 0.00–0.07)
Basophils Absolute: 0 10*3/uL (ref 0.0–0.1)
Basophils Relative: 0 %
Eosinophils Absolute: 0.5 10*3/uL (ref 0.0–0.5)
Eosinophils Relative: 4 %
HCT: 28.3 % — ABNORMAL LOW (ref 36.0–46.0)
Hemoglobin: 8.4 g/dL — ABNORMAL LOW (ref 12.0–15.0)
Immature Granulocytes: 0 %
Lymphocytes Relative: 15 %
Lymphs Abs: 1.6 10*3/uL (ref 0.7–4.0)
MCH: 25.4 pg — ABNORMAL LOW (ref 26.0–34.0)
MCHC: 29.7 g/dL — ABNORMAL LOW (ref 30.0–36.0)
MCV: 85.5 fL (ref 80.0–100.0)
Monocytes Absolute: 0.9 10*3/uL (ref 0.1–1.0)
Monocytes Relative: 9 %
Neutro Abs: 7.8 10*3/uL — ABNORMAL HIGH (ref 1.7–7.7)
Neutrophils Relative %: 72 %
Platelets: 364 10*3/uL (ref 150–400)
RBC: 3.31 MIL/uL — ABNORMAL LOW (ref 3.87–5.11)
RDW: 18.6 % — ABNORMAL HIGH (ref 11.5–15.5)
WBC: 11 10*3/uL — ABNORMAL HIGH (ref 4.0–10.5)
nRBC: 0 % (ref 0.0–0.2)

## 2022-01-30 LAB — GLUCOSE, CAPILLARY
Glucose-Capillary: 112 mg/dL — ABNORMAL HIGH (ref 70–99)
Glucose-Capillary: 120 mg/dL — ABNORMAL HIGH (ref 70–99)
Glucose-Capillary: 137 mg/dL — ABNORMAL HIGH (ref 70–99)
Glucose-Capillary: 139 mg/dL — ABNORMAL HIGH (ref 70–99)
Glucose-Capillary: 142 mg/dL — ABNORMAL HIGH (ref 70–99)
Glucose-Capillary: 143 mg/dL — ABNORMAL HIGH (ref 70–99)

## 2022-01-30 LAB — MAGNESIUM: Magnesium: 1.9 mg/dL (ref 1.7–2.4)

## 2022-01-30 LAB — PHOSPHORUS: Phosphorus: 3.2 mg/dL (ref 2.5–4.6)

## 2022-01-30 MED ORDER — DAKINS (1/4 STRENGTH) 0.125 % EX SOLN
Freq: Two times a day (BID) | CUTANEOUS | Status: AC
  Administered 2022-01-31: 1
  Filled 2022-01-30: qty 473

## 2022-01-30 NOTE — Progress Notes (Signed)
PROGRESS NOTE    Sherry Hampton  WNI:[id number] DOB: Dec 25, 1955 DOA: 11/17/2021 PCP: Pcp, No  No chief complaint on file.   Brief Narrative:  66 year old woman according to chart was independent, living alone and driving and continue to work as needed.  PMH including hyperlipidemia, possibly some memory loss, presented 11/27 with sudden abdominal pain.  Underwent emergent surgery for perforated viscus of the ascending colon with fecal peritonitis same day.  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  Hospitalization has been complicated by fluid collections in the abdomen, malnutrition, currently on TNA, intermittent encephalopathy and failure to thrive and progressive decline in strength and ability to participate with physical therapy.    See initial consult note from Dr. [name] on 1/7 for additional details.  Completed course of antibiotics for enterococcus UTI and intraabdominal and subcutaneous fluid collections.  She remains deconditioned with abnormal neuro exam and encephalopathy.  Status post PEG tube placement.  Noted to have bilateral femoral DVTs on anticoagulation with Eliquis.  Medically stable.  Placement pending.     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 Active Problems:   Perforated abdominal viscus   Goals of care, counseling/discussion   Acute metabolic encephalopathy   Abnormal neurological exam   Acute bilateral deep vein thrombosis (DVT) of femoral veins (HCC)   Fever   S/P percutaneous endoscopic gastrostomy (PEG) tube placement (HCC)   Hypercalcemia   Folate deficiency   Vitamin B6 deficiency   Protein-calorie malnutrition, severe   Common bile duct dilatation   Enterococcus UTI   Memory loss   Delirium   GERD (gastroesophageal reflux disease)   Hyperlipidemia   Hyponatremia   Acute  blood loss anemia   Ileostomy in place [facility])   Fecal peritonitis (HCC)   Hypophosphatemia   Assessment and Plan: Perforated abdominal viscus- (present on admission) --per surgery S/p ex lap, extended right colectomy, end ileostomy, mucous fistula 11/17/2021 CT 2/1 with near complete resolution of heterogenous fat stranding and fluid in low right hemiabdomen with residual tubular and bandlike elements in vicinity (c/w resolving post op hematoma/seroma with fat necrosis)  Abnormal neurological exam Exam difficult, inconsistent MRI c spine recommended 12/21, MRI c spine with C6-7 severe bilateral neural foraminal narrowing, C5-6 severe L and mild R enural foraminal narrowing, C3-4 and C4-5 mild to moderate L neural foraminal narrowing MRI brain without acute abnormality MRI T and L spine done as well without findings that I think would explain her weakness, reviewed with neuro Copper wnl, vitamin E (wnl), vitamin b1 (elevated), and vitamin b6 (low).  Vitamin D wnl. Continue folate and niacin supplementation per neuro EEG with generalized slowing EMG/NCS will need to be outpatient Discuss with neuro again as needed   Acute metabolic encephalopathy- (present on admission) alert, but obviously confused See 12/28/2021 initial consult note [name]) for review of the chart and extensive work-up. Previously with normal ammonia, TSH, HIV S/p high dose thiamine, continue supplemenation MRI brain 12/21 without acute abnormality Repeat MRI brain 1/15 without acute findings EEG generalized slowing c/w encephalopathy MRI C spine with C6-7 severe bilateral neural foraminal narrowing, C5-6 severe L and mild R neural foraminal narrowing, C3-4 and C4-5 mild to moderate L neural foraminal narrowing Neurology evaluated and delirium thought 2/2 prolonged hospitalization, sleep wake disturbance, nutritional def, critical illness Continued encephalopathy multifactorial, related to infection (above - now on  abx), prolonged  hospitalization, nutritional def, critical illness --- follow with antibiotics, nutrition, etc B6 and folic acid supplementation Delirium precautions    S/P percutaneous endoscopic gastrostomy (PEG) tube placement ([place]) 2/3 upper endoscopy with PEG See ethics note from 1/31 by Dr. Cathlean Sauer  Fever Temp 100 on 2/6 1 of 2 cx with staph epi from 1/29 (aerobic only) 1 of 2 cx with staph epi from 1/27 (aerobic only) 2/8 blood cx pending   Acute bilateral deep vein thrombosis (DVT) of femoral veins (HCC) Currently on eliquis Noted on CT 2/1 abd/pelvis  Vitamin B6 deficiency replace  Folate deficiency supplementation  Hypercalcemia Relatively stable today Continue IVF and follow PTH 44 -> suggests non parathyroid hypercalcemia follow PTHrp (low), vitamin D 1, 25 (slightly low)  Enterococcus UTI S/p treatment  Common bile duct dilatation Noted incidentally on CT scan Bili and alk phos wnl Will defer additional imaging to surgery  Protein-calorie malnutrition, severe Continue nutrition as per surgery  Memory loss According to the chart mild prior to admission, patient was independent and still working at times   DVT prophylaxis: eliquis Code Status: full Family Communication: none Disposition:   Status is: Inpatient Remains inpatient appropriate because: no safe discharge plan   Consultants Surgery is primary, [facility] is consulting Palliative care Psych neurology  Procedures:  Status post exploratory laparotomy with right colectomy and ileostomy on 11/17/2021 PEG tube placement 01/24/2022  Antimicrobials:  Anti-infectives (From admission, onward)    Start     Dose/Rate Route Frequency Ordered Stop   01/01/22 2100  piperacillin-tazobactam (ZOSYN) IVPB 3.375 g  Status:  Discontinued        3.375 g 12.5 mL/hr over 240 Minutes Intravenous Every 8 hours 01/01/22 2042 01/10/22 1333   11/25/21 1000  piperacillin-tazobactam (ZOSYN) IVPB 3.375 g   Status:  Discontinued        3.375 g 12.5 mL/hr over 240 Minutes Intravenous Every 8 hours 11/25/21 0909 12/01/21 0822   11/17/21 1830  piperacillin-tazobactam (ZOSYN) IVPB 3.375 g        3.375 g 12.5 mL/hr over 240 Minutes Intravenous Every 8 hours 11/17/21 1415 11/22/21 2359   11/17/21 1230  piperacillin-tazobactam (ZOSYN) IVPB 3.375 g        3.375 g 100 mL/hr over 30 Minutes Intravenous  Once 11/17/21 1220 11/17/21 1304       Subjective: Difficult to communicate with Doesn't consistently follow commands  Objective: Vitals:   01/29/22 2228 01/29/22 2244 01/30/22 0443 01/30/22 1400  BP: (!) 108/51  113/73 120/67  Pulse: (!) 108 (!) 108 (!) 101 (!) 104  Resp: _0 Temp: 99.6 F (37.6 C)  98.3 F (36.8 C) 98.7 F (37.1 C)  TempSrc: Oral  Oral Oral  SpO2: 100%  99% 98%  Weight:      Height:        Intake/Output Summary (Last 24 hours) at 01/30/2022 1651 Last data filed at 01/30/2022 1400 Gross per 24 hour  Intake 1314.89 ml  Output 2175 ml  Net -860.11 ml   Filed Weights   01/24/22 0653 01/26/22 0457 01/27/22 0510  Weight: 79.7 kg 77.3 kg 75.7 kg    Examination:  General: No acute distress. Cardiovascular: RRR Lungs: unlabored Abdomen: midline dressing intact, ostomy, mucous fistula, peg Neurological: alert, but confused Extremities: No clubbing or cyanosis. No edema  Data Reviewed: I have personally reviewed following labs and imaging studies  CBC: Recent Labs  Lab 01/25/22 0523 01/26/22 0829 01/27/22 0409 01/28/22 0437 01/30/22 0410  WBC 9.5  12.0* 11.0* 11.4* 11.0*  NEUTROABS  --   --   --   --  7.8*  HGB 9.2* 8.7* 7.9* 8.6* 8.4*  HCT 31.9* 29.8* 26.6* 29.0* 28.3*  MCV 88.1 87.9 86.4 87.3 85.5  PLT 285 305 282 324 703    Basic Metabolic Panel: Recent Labs  Lab 01/24/22 0054 01/25/22 1211 01/26/22 0829 01/27/22 0409 01/28/22 0437 01/29/22 0418 01/30/22 0410  NA 141  --  139  --  138  --  134*  K 3.2*   < > 3.9 3.7 4.0 4.1 4.3  CL  112*  --  109  --  107  --  102  CO2 25  --  25  --  28  --  29  GLUCOSE 106*  --  128*  --  144*  --  139*  BUN 7*  --  6*  --  8  --  11  CREATININE 0.34*  --  0.37*  --  0.33*  --  0.34*  CALCIUM 9.8  --  9.1  --  9.7  --  9.6  MG 1.8   < > 2.0 1.9 1.8 2.1 1.9  PHOS 2.6   < > 3.5 2.5 2.0* 2.3* 3.2   < > = values in this interval not displayed.    GFR: Estimated Creatinine Clearance: 73.8 mL/min (Mackinley Kiehn) (by C-G formula based on SCr of 0.34 mg/dL (L)).  Liver Function Tests: Recent Labs  Lab 01/24/22 0054 01/28/22 0437 01/30/22 0410  AST  --  19 19  ALT  --  15 14  ALKPHOS  --  78 83  BILITOT  --  0.1* <0.1*  PROT  --  7.7 8.0  ALBUMIN 1.7* 1.8* 1.8*    CBG: Recent Labs  Lab 01/29/22 2342 01/30/22 0352 01/30/22 0752 01/30/22 1214 01/30/22 1603  GLUCAP 136* 137* 112* 143* 139*     Recent Results (from the past 240 hour(s))  Culture, blood (routine x 2)     Status: None (Preliminary result)   Collection Time: 01/29/22  8:01 PM   Specimen: BLOOD  Result Value Ref Range Status   Specimen Description   Final    BLOOD BLOOD RIGHT HAND Performed at [facility], [place] [address] [place], [place] [address]    Special Requests   Final    BOTTLES DRAWN AEROBIC ONLY Blood Culture adequate volume Performed at [place] [address], [place], [place] [address]    Culture   Final    NO GROWTH < 24 HOURS Performed at [facility] [address], [place], [place] [address]    Report Status PENDING  Incomplete  Culture, blood (routine x 2)     Status: None (Preliminary result)   Collection Time: 01/29/22  8:01 PM   Specimen: BLOOD  Result Value Ref Range Status   Specimen Description   Final    BLOOD LEFT ANTECUBITAL Performed at [facility] [address], [place], [place] [address]    Special Requests   Final    BOTTLES DRAWN AEROBIC ONLY Blood Culture adequate volume Performed at [place] [facility] [address], [place], [place] [address]    Culture   Final    NO GROWTH < 24 HOURS Performed at [facility] [address], [place],  [address]    Report Status PENDING  Incomplete         Radiology Studies: No results  found.      Scheduled Meds:  apixaban  10 mg Per Tube BID   Followed by   Derrill Memo ON 02/02/2022] apixaban  5 mg Per Tube BID   chlorhexidine  15 mL Mouth Rinse BID   ferrous sulfate  220 mg Per Tube BID WC   folic acid  1 mg Per Tube Daily   insulin aspart  0-9 Units Subcutaneous Q4H   mouth rinse  15 mL Mouth Rinse q12n4p   multivitamin  15 mL Per Tube Daily   niacin  100 mg Per Tube QHS   pantoprazole sodium  40 mg Per Tube Daily   psyllium  1 packet Oral BID   vitamin B-6  100 mg Per Tube Daily   sodium hypochlorite   Irrigation BID   thiamine  100 mg Per Tube BID   Continuous Infusions:  sodium chloride 10 mL/hr at 01/28/22 1109   feeding supplement (OSMOLITE 1.5 CAL) 1,000 mL (01/30/22 1131)     LOS: 74 days    Time spent: over 30 min    Fayrene Helper, MD [facility]   To contact the attending provider between 7A-7P or the covering provider during after hours 7P-7A, please log into the web site [email or web address] and access using universal [place] password for that web site. If you do not have the password, please call the hospital operator.  01/30/2022, 4:51 PM

## 2022-01-30 NOTE — Progress Notes (Signed)
Progress Note  6 Days Post-Op  Subjective: No acute changes. Prevalon boots applied yesterday. Called and updated patient's sister, [name], by phone this AM.   Objective: Vital signs in last 24 hours: Temp:  [97.7 F (36.5 C)-99.6 F (37.6 C)] 98.3 F (36.8 C) (02/09 0443) Pulse Rate:  [101-108] 101 (02/09 0443) Resp:  [15-20] 15 (02/09 0443) BP: (108-130)/(51-80) 113/73 (02/09 0443) SpO2:  [99 %-100 %] 99 % (02/09 0443) Last BM Date: 01/29/22  Intake/Output from previous day: 02/08 0701 - 02/09 0700 In: 1934.9 [I.V.:179.9; NG/GT:1500; IV Piggyback:254.9] Out: 1800 [Urine:1400; Stool:400] Intake/Output this shift: No intake/output data recorded.  PE: General: resting, NAD Heart: regular Resp: normal work of breathing Abdomen: soft, nondistended, nontender to palpation. Midline wound with 2.5 cm tract superiorly with scant blue-green drainage on gauze. Ileostomy productive. 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 PEG in LUQ with TF running   Lab Results:  Recent Labs    01/28/22 0437 01/30/22 0410  WBC 11.4* 11.0*  HGB 8.6* 8.4*  HCT 29.0* 28.3*  PLT 324 364   BMET Recent Labs    01/28/22 0437 01/29/22 0418 01/30/22 0410  NA 138  --  134*  K 4.0 4.1 4.3  CL 107  --  102  CO2 28  --  29  GLUCOSE 144*  --  139*  BUN 8  --  11  CREATININE 0.33*  --  0.34*  CALCIUM 9.7  --  9.6   PT/INR No results for input(s): LABPROT, INR in the last 72 hours. CMP     Component Value Date/Time   NA 134 (L) 01/30/2022 0410   K 4.3 01/30/2022 0410   CL 102 01/30/2022 0410   CO2 29 01/30/2022 0410   GLUCOSE 139 (H) 01/30/2022 0410   BUN 11 01/30/2022 0410   CREATININE 0.34 (L) 01/30/2022 0410   CALCIUM 9.6 01/30/2022 0410   CALCIUM 10.9 (H) 01/01/2022 0449   PROT 8.0 01/30/2022 0410   ALBUMIN 1.8 (L) 01/30/2022 0410   AST 19 01/30/2022 0410   ALT 14 01/30/2022 0410   ALKPHOS 83 01/30/2022 0410   BILITOT <0.1 (L) 01/30/2022 0410   GFRNONAA >60 01/30/2022 0410    Lipase     Component Value Date/Time   LIPASE 22 11/17/2021 0955       Studies/Results: No results found.  Anti-infectives: Anti-infectives (From admission, onward)    Start     Dose/Rate Route Frequency Ordered Stop   01/01/22 2100  piperacillin-tazobactam (ZOSYN) IVPB 3.375 g  Status:  Discontinued        3.375 g 12.5 mL/hr over 240 Minutes Intravenous Every 8 hours 01/01/22 2042 01/10/22 1333   11/25/21 1000  piperacillin-tazobactam (ZOSYN) IVPB 3.375 g  Status:  Discontinued        3.375 g 12.5 mL/hr over 240 Minutes Intravenous Every 8 hours 11/25/21 0909 12/01/21 0822   11/17/21 1830  piperacillin-tazobactam (ZOSYN) IVPB 3.375 g        3.375 g 12.5 mL/hr over 240 Minutes Intravenous Every 8 hours 11/17/21 1415 11/22/21 2359   11/17/21 1230  piperacillin-tazobactam (ZOSYN) IVPB 3.375 g        3.375 g 100 mL/hr over 30 Minutes Intravenous  Once 11/17/21 1220 11/17/21 1304        Assessment/Plan 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 Harlow Asa 11/27 - path: necrotic colon at site of perforation, no malignancy or dysplasia noted  -  Ileostomy output improved. Continue BID metamucil.  - WOC following for ileostomy, mucous fistula - Wound improved with healthy granulation tissue present, no tracts or purulent drainage present on dressing change this AM. Daily WTD dressing  - Pathology benign. CT distal descending/proximal sigmoid bowel luminal collapse. - CT 1/11 - some extravasated oral contrast in the subcutaneous abdominal wall superior and lateral to the ostomy at this level. There is inflammatory tissue at the level of the ostomy extending superiorly abutting the inferior tip of the liver and fundus of the gallbladder similar to the prior examination. There is likely some early fluid collection development anterior to the right kidney image 7/31 measuring 1.9 by 1.4 by 3.7 cm.  Previously described fluid collections near the ostomy are not as well defined on the current study. 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 There is no free intraperitoneal air or ascites. Small air-fluid collection at the level of the umbilicus is again seen containing air. This collection measures 2.5 x 0.7 by 1.3 cm and has mildly increased in size. Air in the collection is new from prior. - no collection around the ileostomy that is drainable - the best way to try to resolve this is abx and improving nutrition - zosyn completed for last CT scan findings and UTI on 1/20 - Dr. Hassell Done stopped her TNA on 1/17 - at this time, the patient has no medical need for TNA as she has a functional GI tract.  Parenteral nutrition carries many long-term risks  and enteral nutrition is superior; therefore, oral intake or intake via other means such as g-tube, would be needed henceforth.    - palliative saw the patient and it sounds like she is good with a feeding tube and thinks that is what her sister would want  - repeat CT 2/1: resolution in RLQ stranding and resolving hematoma/seroma, bilateral common femoral DVTs - Family consented to PEG placement 2/2 and this was done 2/3. TF at goal.   Bilateral femoral DVTs - noted on repeat CT AP 2/1, on eliquis   FEN - Dysphagia 2 diet/but won't eat; PEG placed 2/3 and patient is tolerating TF at goal, replace Mg today    VTE - SCDs, eliquis ID - zosyn 11/27>12/2, 12/5>12/11, 1/11> 1/20; repeating blood and urine cxs, WBC stable at 11 and pt is afebrile  Foley - None currently    - encephalopathy/FTT -  On folic acid and thiamine supplementation. Appreciate TRH following as well.   - psych saw 12/27 and determined patient does not have capacity   - frequent urination - Urine Cx 1/10 with enterococcus faecalis sensitive to ampicillin, Zosyn completed. Urine  Cx ordered 1/26 with multiple species, reordered 1/30 with same, afebrile.   Dispo: working on placement. Patient is medically stable for discharge at this point. PT/OT have signed off. While I understand that patient is not able to participate well with therapies, I am not sure how appropriate it is not to continue to evaluate periodically especially now that malnutrition can gradually begin to improve.   LOS: 74 days   I reviewed hospitalist notes, last 24 h vitals and pain scores, last 48 h intake and output, and last 24 h labs and trends.  This care required low level of medical decision making.    Norm Parcel, [facility] Surgery 01/30/2022, 7:45 AM Please see Amion for pager number during day hours  7:00am-4:30pm

## 2022-01-31 ENCOUNTER — Inpatient Hospital Stay (HOSPITAL_COMMUNITY): Payer: Medicare HMO

## 2022-01-31 LAB — GLUCOSE, CAPILLARY
Glucose-Capillary: 124 mg/dL — ABNORMAL HIGH (ref 70–99)
Glucose-Capillary: 130 mg/dL — ABNORMAL HIGH (ref 70–99)
Glucose-Capillary: 135 mg/dL — ABNORMAL HIGH (ref 70–99)
Glucose-Capillary: 138 mg/dL — ABNORMAL HIGH (ref 70–99)
Glucose-Capillary: 143 mg/dL — ABNORMAL HIGH (ref 70–99)

## 2022-01-31 LAB — POTASSIUM: Potassium: 3.8 mmol/L (ref 3.5–5.1)

## 2022-01-31 LAB — PHOSPHORUS: Phosphorus: 2.4 mg/dL — ABNORMAL LOW (ref 2.5–4.6)

## 2022-01-31 LAB — MAGNESIUM: Magnesium: 1.9 mg/dL (ref 1.7–2.4)

## 2022-01-31 MED ORDER — IOHEXOL 300 MG/ML  SOLN
50.0000 mL | Freq: Once | INTRAMUSCULAR | Status: AC | PRN
  Administered 2022-01-31: 50 mL

## 2022-01-31 NOTE — Progress Notes (Signed)
Called and updated patient's sister, [name], by phone. She was very Patent attorney.   Norm Parcel, [facility] Surgery 01/31/2022, 10:10 AM Please see Amion for pager number during day hours 7:00am-4:30pm

## 2022-01-31 NOTE — Progress Notes (Signed)
PROGRESS NOTE    Sherry Hampton  NWG:[id number] DOB: 02/11/1956 DOA: 11/17/2021 PCP: Pcp, No  No chief complaint on file.   Brief Narrative:  66 year old woman according to chart was independent, living alone and driving and continue to work as needed.  PMH including hyperlipidemia, possibly some memory loss, presented 11/27 with sudden abdominal pain.  Underwent emergent surgery for perforated viscus of the ascending colon with fecal peritonitis same day.  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  Hospitalization has been complicated by fluid collections in the abdomen, malnutrition, currently on TNA, intermittent encephalopathy and failure to thrive and progressive decline in strength and ability to participate with physical therapy.    See initial consult note from Dr. [name] on 1/7 for additional details.  Completed course of antibiotics for enterococcus UTI and intraabdominal and subcutaneous fluid collections.  She remains deconditioned with abnormal neuro exam and encephalopathy.  Status post PEG tube placement.  Noted to have bilateral femoral DVTs on anticoagulation with Eliquis.  Medically stable.  Placement pending.     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 Active Problems:   Perforated abdominal viscus   Goals of care, counseling/discussion   Acute metabolic encephalopathy   Abnormal neurological exam   Acute bilateral deep vein thrombosis (DVT) of femoral veins (HCC)   Fever   S/P percutaneous endoscopic gastrostomy (PEG) tube placement (HCC)   Hypercalcemia   Folate deficiency   Vitamin B6 deficiency   Protein-calorie malnutrition, severe   Common bile duct dilatation   Enterococcus UTI   Memory loss   Delirium   GERD (gastroesophageal reflux disease)   Hyperlipidemia   Hyponatremia   Acute  blood loss anemia   Ileostomy in place [facility])   Fecal peritonitis (HCC)   Hypophosphatemia   Assessment and Plan: Perforated abdominal viscus- (present on admission) --per surgery S/p ex lap, extended right colectomy, end ileostomy, mucous fistula 11/17/2021 CT 2/1 with near complete resolution of heterogenous fat stranding and fluid in low right hemiabdomen with residual tubular and bandlike elements in vicinity (c/w resolving post op hematoma/seroma with fat necrosis)  Abnormal neurological exam Exam difficult, inconsistent MRI c spine recommended 12/21, MRI c spine with C6-7 severe bilateral neural foraminal narrowing, C5-6 severe L and mild R enural foraminal narrowing, C3-4 and C4-5 mild to moderate L neural foraminal narrowing MRI brain without acute abnormality MRI T and L spine done as well without findings that I think would explain her weakness, reviewed with neuro Copper wnl, vitamin E (wnl), vitamin b1 (elevated), and vitamin b6 (low).  Vitamin D wnl. Continue folate and niacin supplementation per neuro EEG with generalized slowing EMG/NCS will need to be outpatient Discuss with neuro again as needed   Acute metabolic encephalopathy- (present on admission) alert, but obviously confused - stable, not changing meaningfully See 12/28/2021 initial consult note [name]) for review of the chart and extensive work-up. Previously with normal ammonia, TSH, HIV S/p high dose thiamine, continue supplemenation MRI brain 12/21 without acute abnormality Repeat MRI brain 1/15 without acute findings EEG generalized slowing c/w encephalopathy MRI C spine with C6-7 severe bilateral neural foraminal narrowing, C5-6 severe L and mild R neural foraminal narrowing, C3-4 and C4-5 mild to moderate L neural foraminal narrowing Neurology evaluated and delirium thought 2/2 prolonged hospitalization, sleep wake disturbance, nutritional def, critical illness Continued encephalopathy multifactorial,  related to infection (above -  now on abx), prolonged hospitalization, nutritional def, critical illness --- follow with antibiotics, nutrition, etc B6 and folic acid supplementation Delirium precautions    S/P percutaneous endoscopic gastrostomy (PEG) tube placement ([place]) 2/3 upper endoscopy with PEG See ethics note from 1/31 by Dr. Cathlean Sauer Concern for drainage around g tube, no evidence of contrast leak on x ray  Fever Temp 100 on 2/6 1 of 2 cx with staph epi from 1/29 (aerobic only) 1 of 2 cx with staph epi from 1/27 (aerobic only) 2/8 blood cx NGTD x2 days Repeat urine cx pending per surgery  Acute bilateral deep vein thrombosis (DVT) of femoral veins (HCC) Currently on eliquis Noted on CT 2/1 abd/pelvis  Vitamin B6 deficiency replace  Folate deficiency supplementation  Hypercalcemia Relatively stable recently, follow intermittently PTH 44 -> suggests non parathyroid hypercalcemia follow PTHrp (low), vitamin D 1, 25 (slightly low)  Enterococcus UTI S/p treatment  Common bile duct dilatation Noted incidentally on CT scan Bili and alk phos wnl Will defer additional imaging to surgery  Protein-calorie malnutrition, severe Continue nutrition as per surgery  Memory loss According to the chart mild prior to admission, patient was independent and still working at times   DVT prophylaxis: eliquis Code Status: full Family Communication: none Disposition:   Status is: Inpatient Remains inpatient appropriate because: no safe discharge plan   Consultants Surgery is primary, [facility] is consulting Palliative care Psych neurology  Procedures:  Status post exploratory laparotomy with right colectomy and ileostomy on 11/17/2021 PEG tube placement 01/24/2022  Antimicrobials:  Anti-infectives (From admission, onward)    Start     Dose/Rate Route Frequency Ordered Stop   01/01/22 2100  piperacillin-tazobactam (ZOSYN) IVPB 3.375 g  Status:  Discontinued        3.375  g 12.5 mL/hr over 240 Minutes Intravenous Every 8 hours 01/01/22 2042 01/10/22 1333   11/25/21 1000  piperacillin-tazobactam (ZOSYN) IVPB 3.375 g  Status:  Discontinued        3.375 g 12.5 mL/hr over 240 Minutes Intravenous Every 8 hours 11/25/21 0909 12/01/21 0822   11/17/21 1830  piperacillin-tazobactam (ZOSYN) IVPB 3.375 g        3.375 g 12.5 mL/hr over 240 Minutes Intravenous Every 8 hours 11/17/21 1415 11/22/21 2359   11/17/21 1230  piperacillin-tazobactam (ZOSYN) IVPB 3.375 g        3.375 g 100 mL/hr over 30 Minutes Intravenous  Once 11/17/21 1220 11/17/21 1304       Subjective: No complaints Difficult to communicate with, inconsistent speech   Objective: Vitals:   01/30/22 2056 01/31/22 0544 01/31/22 0545 01/31/22 1320  BP: 112/64 110/63  120/71  Pulse: (!) 101 97  (!) 108  Resp: '18 18  14  ' Temp: (!) 97.4 F (36.3 C) 98 F (36.7 C)  98.6 F (37 C)  TempSrc: Oral Oral  Oral  SpO2: 98% 97%  99%  Weight:   77.6 kg   Height:        Intake/Output Summary (Last 24 hours) at 01/31/2022 1547 Last data filed at 01/31/2022 1300 Gross per 24 hour  Intake 1322.14 ml  Output 1785 ml  Net -462.86 ml   Filed Weights   01/26/22 0457 01/27/22 0510 01/31/22 0545  Weight: 77.3 kg 75.7 kg 77.6 kg    Examination:  General: chronically ill appearing, nad Cardiovascular: RRR Lungs: unlabored Abdomen: midline dressing, ostomy, mucous fistula, peg Neurological: confused, not consistently following commands.  Skin: Warm and dry. No rashes or lesions. Extremities: No clubbing or  cyanosis. No edema.   Data Reviewed: I have personally reviewed following labs and imaging studies  CBC: Recent Labs  Lab 01/25/22 0523 01/26/22 0829 01/27/22 0409 01/28/22 0437 01/30/22 0410  WBC 9.5 12.0* 11.0* 11.4* 11.0*  NEUTROABS  --   --   --   --  7.8*  HGB 9.2* 8.7* 7.9* 8.6* 8.4*  HCT 31.9* 29.8* 26.6* 29.0* 28.3*  MCV 88.1 87.9 86.4 87.3 85.5  PLT 285 305 282 324 364    Basic  Metabolic Panel: Recent Labs  Lab 01/26/22 0829 01/27/22 0409 01/28/22 0437 01/29/22 0418 01/30/22 0410 01/31/22 0420  NA 139  --  138  --  134*  --   K 3.9 3.7 4.0 4.1 4.3 3.8  CL 109  --  107  --  102  --   CO2 25  --  28  --  29  --   GLUCOSE 128*  --  144*  --  139*  --   BUN 6*  --  8  --  11  --   CREATININE 0.37*  --  0.33*  --  0.34*  --   CALCIUM 9.1  --  9.7  --  9.6  --   MG 2.0 1.9 1.8 2.1 1.9 1.9  PHOS 3.5 2.5 2.0* 2.3* 3.2 2.4*    GFR: Estimated Creatinine Clearance: 73.8 mL/min (Maraki Macquarrie) (by C-G formula based on SCr of 0.34 mg/dL (L)).  Liver Function Tests: Recent Labs  Lab 01/28/22 0437 01/30/22 0410  AST 19 19  ALT 15 14  ALKPHOS 78 83  BILITOT 0.1* <0.1*  PROT 7.7 8.0  ALBUMIN 1.8* 1.8*    CBG: Recent Labs  Lab 01/30/22 1943 01/30/22 2351 01/31/22 0357 01/31/22 0745 01/31/22 1154  GLUCAP 120* 142* 124* 143* 135*     Recent Results (from the past 240 hour(s))  Culture, blood (routine x 2)     Status: None (Preliminary result)   Collection Time: 01/29/22  8:01 PM   Specimen: BLOOD  Result Value Ref Range Status   Specimen Description   Final    BLOOD BLOOD RIGHT HAND Performed at [facility] [address] [place], [place] [address]    Special Requests   Final    BOTTLES DRAWN AEROBIC ONLY Blood Culture adequate volume Performed at [facility] [address] [place], [place] [address]    Culture   Final    NO GROWTH 2 DAYS Performed at [facility] [address] [place], [place] [address]    Report Status PENDING  Incomplete  Culture, blood (routine x 2)     Status: None (Preliminary result)   Collection Time: 01/29/22  8:01 PM   Specimen: BLOOD  Result Value Ref Range Status   Specimen Description   Final    BLOOD LEFT ANTECUBITAL Performed at [facility] [address] [place], [place] [address]    Special Requests   Final    BOTTLES DRAWN AEROBIC ONLY  Blood Culture adequate volume Performed at [facility] [address] [place], [place] [address]    Culture   Final    NO GROWTH 2 DAYS Performed at [facility] [address], [place],  [address]    Report Status PENDING  Incomplete         Radiology Studies: DG ABDOMEN PEG TUBE LOCATION  Result Date: 01/31/2022 CLINICAL DATA:  Gastrostomy tube location/patency EXAM: ABDOMEN - 1  VIEW COMPARISON:  CT 01/22/2022 FINDINGS: There is no evidence of bowel obstruction. There is Jashay Roddy percutaneous gastrostomy tube overlying the stomach. 50 mL of Omnipaque 300 was injected into the existing tube and flushed with sterile water. Contrast fills the stomach without evidence of contrast leak. IMPRESSION: Gastrostomy tube is in the stomach. No evidence of contrast leak on single frontal view of the abdomen. Electronically Signed   By: Maurine Simmering M.D.   On: 01/31/2022 09:07        Scheduled Meds:  apixaban  10 mg Per Tube BID   Followed by   Derrill Memo ON 02/02/2022] apixaban  5 mg Per Tube BID   chlorhexidine  15 mL Mouth Rinse BID   ferrous sulfate  220 mg Per Tube BID WC   folic acid  1 mg Per Tube Daily   insulin aspart  0-9 Units Subcutaneous Q4H   mouth rinse  15 mL Mouth Rinse q12n4p   multivitamin  15 mL Per Tube Daily   niacin  100 mg Per Tube QHS   pantoprazole sodium  40 mg Per Tube Daily   psyllium  1 packet Oral BID   vitamin B-6  100 mg Per Tube Daily   sodium hypochlorite   Irrigation BID   thiamine  100 mg Per Tube BID   Continuous Infusions:  sodium chloride 10 mL/hr at 01/31/22 0616   feeding supplement (OSMOLITE 1.5 CAL) 1,000 mL (01/31/22 0616)     LOS: 75 days    Time spent: over 30 min    Fayrene Helper, MD [facility]   To contact the attending provider between 7A-7P or the covering provider during after hours 7P-7A, please log into the web site [email or web address] and access using universal [place] password for that  web site. If you do not have the password, please call the hospital operator.  01/31/2022, 3:47 PM

## 2022-01-31 NOTE — Progress Notes (Signed)
Progress Note  7 Days Post-Op  Subjective: No acute changes.   Objective: Vital signs in last 24 hours: Temp:  [97.4 F (36.3 C)-98.7 F (37.1 C)] 98 F (36.7 C) (02/10 0544) Pulse Rate:  [97-104] 97 (02/10 0544) Resp:  [17-18] 18 (02/10 0544) BP: (110-120)/(63-67) 110/63 (02/10 0544) SpO2:  [97 %-98 %] 97 % (02/10 0544) Weight:  [77.6 kg] 77.6 kg (02/10 0545) Last BM Date: 01/31/22  Intake/Output from previous day: 02/09 0701 - 02/10 0700 In: 1322.1 [I.V.:241.1; NG/GT:1081] Out: 2300 [Urine:1900; Stool:400] Intake/Output this shift: No intake/output data recorded.  PE: General: resting, NAD Heart: regular Resp: normal work of breathing Abdomen: soft, nondistended, nontender to palpation. Midline wound with 2 cm tract superiorly with no drainage on gauze today. Ileostomy productive. 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 PEG in LUQ with small amount drainage around    Lab Results:  Recent Labs    01/30/22 0410  WBC 11.0*  HGB 8.4*  HCT 28.3*  PLT 364   BMET Recent Labs    01/30/22 0410 01/31/22 0420  NA 134*  --   K 4.3 3.8  CL 102  --   CO2 29  --   GLUCOSE 139*  --   BUN 11  --   CREATININE 0.34*  --   CALCIUM 9.6  --    PT/INR No results for input(s): LABPROT, INR in the last 72 hours. CMP     Component Value Date/Time   NA 134 (L) 01/30/2022 0410   K 3.8 01/31/2022 0420   CL 102 01/30/2022 0410   CO2 29 01/30/2022 0410   GLUCOSE 139 (H) 01/30/2022 0410   BUN 11 01/30/2022 0410   CREATININE 0.34 (L) 01/30/2022 0410   CALCIUM 9.6 01/30/2022 0410   CALCIUM 10.9 (H) 01/01/2022 0449   PROT 8.0 01/30/2022 0410   ALBUMIN 1.8 (L) 01/30/2022 0410   AST 19 01/30/2022 0410   ALT 14 01/30/2022 0410   ALKPHOS 83 01/30/2022 0410   BILITOT <0.1 (L) 01/30/2022 0410   GFRNONAA >60 01/30/2022 0410   Lipase     Component Value Date/Time   LIPASE 22 11/17/2021 0955       Studies/Results: No results  found.  Anti-infectives: Anti-infectives (From admission, onward)    Start     Dose/Rate Route Frequency Ordered Stop   01/01/22 2100  piperacillin-tazobactam (ZOSYN) IVPB 3.375 g  Status:  Discontinued        3.375 g 12.5 mL/hr over 240 Minutes Intravenous Every 8 hours 01/01/22 2042 01/10/22 1333   11/25/21 1000  piperacillin-tazobactam (ZOSYN) IVPB 3.375 g  Status:  Discontinued        3.375 g 12.5 mL/hr over 240 Minutes Intravenous Every 8 hours 11/25/21 0909 12/01/21 0822   11/17/21 1830  piperacillin-tazobactam (ZOSYN) IVPB 3.375 g        3.375 g 12.5 mL/hr over 240 Minutes Intravenous Every 8 hours 11/17/21 1415 11/22/21 2359   11/17/21 1230  piperacillin-tazobactam (ZOSYN) IVPB 3.375 g        3.375 g 100 mL/hr over 30 Minutes Intravenous  Once 11/17/21 1220 11/17/21 1304        Assessment/Plan 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 Harlow Asa 11/27 - path: necrotic colon at site of perforation, no malignancy or dysplasia noted  - Ileostomy output improved. Continue BID metamucil.  - WOC following for ileostomy, mucous fistula - Wound improved with healthy granulation  tissue present, no tracts or purulent drainage present on dressing change this AM. Daily WTD dressing  - Pathology benign. CT distal descending/proximal sigmoid bowel luminal collapse. - CT 1/11 - some extravasated oral contrast in the subcutaneous abdominal wall superior and lateral to the ostomy at this level. There is inflammatory tissue at the level of the ostomy extending superiorly abutting the inferior tip of the liver and fundus of the gallbladder similar to the prior examination. There is likely some early fluid collection development anterior to the right kidney image 7/31 measuring 1.9 by 1.4 by 3.7 cm. Previously described fluid collections near the ostomy are not as well defined on the current study. Ill-defined omental  inflammation and fluid in the lower abdomen and left upper quadrant appears unchanged and may be related to omental infarct. There is no free intraperitoneal air or ascites. Small air-fluid collection at the level of the umbilicus is again seen containing air. This collection measures 2.5 x 0.7 by 1.3 cm and has mildly increased in size. Air in the collection is new from prior. - no collection around the ileostomy that is drainable - the best way to try to resolve this is abx and improving nutrition - zosyn completed for last CT scan findings and UTI on 1/20 - Dr. Hassell Done stopped her TNA on 1/17 - at this time, the patient has no medical need for TNA as she has a functional GI tract.  Parenteral nutrition carries many long-term risks  and enteral nutrition is superior; therefore, oral intake or intake via other means such as g-tube, would be needed henceforth.    - palliative saw the patient and it sounds like she is good with a feeding tube and thinks that is what her sister would want  - repeat CT 2/1: resolution in RLQ stranding and resolving hematoma/seroma, bilateral common femoral DVTs - Family consented to PEG placement 2/2 and this was done 2/3. TF at goal. Some increased drainage noted around PEG this AM - check positioning of PEG this AM - holding TF temporarily   Bilateral femoral DVTs - noted on repeat CT AP 2/1, on eliquis   FEN - Dysphagia 2 diet/but won't eat; PEG placed 2/3 and patient is tolerating TF at goal   VTE - SCDs, eliquis ID - zosyn 11/27>12/2, 12/5>12/11, 1/11> 1/20; repeating blood and urine cxs, WBC stable at 11 and pt is afebrile  Foley - None currently    - encephalopathy/FTT -  On folic acid and thiamine supplementation. Appreciate TRH following as well.   - psych saw 12/27 and determined patient does not have capacity   Dispo: working on placement. Patient is medically stable for discharge at this point. Checking position of gastrostomy tube this AM  LOS: 75  days   I reviewed hospitalist notes, last 24 h vitals and pain scores, last 48 h intake and output, and last 24 h labs and trends.  This care required low level of medical decision making.    Norm Parcel, [facility] 01/31/2022, 8:50 AM Please see Amion for pager number during day hours 7:00am-4:30pm

## 2022-01-31 NOTE — Progress Notes (Signed)
Nutrition Follow-up  DOCUMENTATION CODES:   Severe malnutrition in context of acute illness/injury  INTERVENTION:   -Continue Osmolite 1.5 @ 60 ml/hr via PEG -Continue liquid MVI daily via tube  -Recommend free water flushes of 200 ml every 6 hours (800 ml)  NUTRITION DIAGNOSIS:   Severe Malnutrition related to acute illness as evidenced by energy intake < or equal to 50% for > or equal to 5 days, moderate fat depletion, severe muscle depletion.  Ongoing.  GOAL:   Patient will meet greater than or equal to 90% of their needs  Meeting with TF  MONITOR:   PO intake, Labs, Weight trends, I & O's, TF  ASSESSMENT:   66 year old female who presents to the emergency department with sudden onset of abdominal pain and distention.CT scan of abdomen and pelvis shows findings consistent with free air with a large pneumoperitoneum.  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  11/27: s/p ex lap, right colectomy, ileostomy 12/11: PICC placed 12/12: TPN initiated 1/17: TPN discontinued 1/26: PICC removed 2/3: s/p UGI endoscopy with PEG placement   Patient continues to receive Osmolite 1.5 @ 60 ml/hr via PEG. TF were stopped d/t drainage from tube, x-ray reveals no leaking. TF has been resumed.  Admission weight: 176 lbs Current weight: 175 lbs  Medications: Ferrous sulfate, Folic acid, Liquid MVI, Niacin, Metamucil, Vitamin B6, Thiamine  Labs reviewed:  CBGs: 124-143 Low Phos (2.4)  Diet Order:   Diet Order             DIET DYS 2 Room service appropriate? Yes; Fluid consistency: Thin  Diet effective now                   EDUCATION NEEDS:   Education needs have been addressed  Skin:  Skin Assessment: Skin Integrity Issues: Skin Integrity Issues:: Incisions, Other (Comment) Incisions: 11/27 abdomen Other: left thigh pressure injury  Last BM:  2/10 -ileostomy  Height:   Ht Readings from Last 1 Encounters:  01/24/22 5\' 10"   (1.778 m)    Weight:   Wt Readings from Last 1 Encounters:  01/31/22 77.6 kg    BMI:  Body mass index is 24.55 kg/m.  Estimated Nutritional Needs:   Kcal:  1950-2150  Protein:  95-105g  Fluid:  2L/day  Clayton Bibles, MS, RD, LDN Inpatient Clinical Dietitian Contact information available via Amion

## 2022-02-01 LAB — CBC
HCT: 26.5 % — ABNORMAL LOW (ref 36.0–46.0)
Hemoglobin: 7.9 g/dL — ABNORMAL LOW (ref 12.0–15.0)
MCH: 26 pg (ref 26.0–34.0)
MCHC: 29.8 g/dL — ABNORMAL LOW (ref 30.0–36.0)
MCV: 87.2 fL (ref 80.0–100.0)
Platelets: 362 10*3/uL (ref 150–400)
RBC: 3.04 MIL/uL — ABNORMAL LOW (ref 3.87–5.11)
RDW: 18.6 % — ABNORMAL HIGH (ref 11.5–15.5)
WBC: 9.4 10*3/uL (ref 4.0–10.5)
nRBC: 0 % (ref 0.0–0.2)

## 2022-02-01 LAB — GLUCOSE, CAPILLARY
Glucose-Capillary: 100 mg/dL — ABNORMAL HIGH (ref 70–99)
Glucose-Capillary: 106 mg/dL — ABNORMAL HIGH (ref 70–99)
Glucose-Capillary: 130 mg/dL — ABNORMAL HIGH (ref 70–99)
Glucose-Capillary: 140 mg/dL — ABNORMAL HIGH (ref 70–99)
Glucose-Capillary: 142 mg/dL — ABNORMAL HIGH (ref 70–99)
Glucose-Capillary: 142 mg/dL — ABNORMAL HIGH (ref 70–99)

## 2022-02-01 LAB — BASIC METABOLIC PANEL
Anion gap: 4 — ABNORMAL LOW (ref 5–15)
BUN: 17 mg/dL (ref 8–23)
CO2: 29 mmol/L (ref 22–32)
Calcium: 10.1 mg/dL (ref 8.9–10.3)
Chloride: 105 mmol/L (ref 98–111)
Creatinine, Ser: 0.32 mg/dL — ABNORMAL LOW (ref 0.44–1.00)
GFR, Estimated: >60 mL/min (ref >60)
Glucose, Bld: 141 mg/dL — ABNORMAL HIGH (ref 70–99)
Potassium: 3.9 mmol/L (ref 3.5–5.1)
Sodium: 138 mmol/L (ref 135–145)

## 2022-02-01 LAB — MAGNESIUM: Magnesium: 1.9 mg/dL (ref 1.7–2.4)

## 2022-02-01 LAB — ALBUMIN: Albumin: 1.9 g/dL — ABNORMAL LOW (ref 3.5–5.0)

## 2022-02-01 LAB — PHOSPHORUS: Phosphorus: 2.7 mg/dL (ref 2.5–4.6)

## 2022-02-01 MED ORDER — FREE WATER
200.0000 mL | Freq: Four times a day (QID) | Status: DC
  Administered 2022-02-01 – 2022-02-05 (×14): 200 mL

## 2022-02-01 NOTE — Progress Notes (Signed)
8 Days Post-Op   Subjective/Chief Complaint: unchanged   Objective: Vital signs in last 24 hours: Temp:  [97.8 F (36.6 C)-98.6 F (37 C)] 98.4 F (36.9 C) (02/11 0625) Pulse Rate:  [95-108] 95 (02/11 0625) Resp:  [14-18] 18 (02/11 0625) BP: (114-120)/(68-71) 119/68 (02/11 0625) SpO2:  [99 %-100 %] 100 % (02/11 0625) Last BM Date: 01/31/22  Intake/Output from previous day: 02/10 0701 - 02/11 0700 In: 746.7 [P.O.:60; I.V.:77.3; NG/GT:549.3] Out: 2010 [Urine:1450; Stool:560] Intake/Output this shift: Total I/O In: 300 [P.O.:60; NG/GT:240] Out: 1150 [Urine:850; Stool:300]   General: resting, NAD CV regular Resp: normal work of breathing Abdomen: soft, nondistended, nontender to palpation. Midline wound with no drainage. Ileostomy productive. 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 gastrostomy site clean   Lab Results:  Recent Labs    01/30/22 0410 02/01/22 0434  WBC 11.0* 9.4  HGB 8.4* 7.9*  HCT 28.3* 26.5*  PLT 364 362   BMET Recent Labs    01/30/22 0410 01/31/22 0420 02/01/22 0434  NA 134*  --  PENDING  K 4.3 3.8 PENDING  CL 102  --  PENDING  CO2 29  --  PENDING  GLUCOSE 139*  --  141*  BUN 11  --  17  CREATININE 0.34*  --  PENDING  CALCIUM 9.6  --  PENDING   PT/INR No results for input(s): LABPROT, INR in the last 72 hours. ABG No results for input(s): PHART, HCO3 in the last 72 hours.  Invalid input(s): PCO2, PO2  Studies/Results: DG ABDOMEN PEG TUBE LOCATION  Result Date: 01/31/2022 CLINICAL DATA:  Gastrostomy tube location/patency EXAM: ABDOMEN - 1 VIEW COMPARISON:  CT 01/22/2022 FINDINGS: There is no evidence of bowel obstruction. There is a percutaneous gastrostomy tube overlying the stomach. 50 mL of Omnipaque 300 was injected into the existing tube and flushed with sterile water. Contrast fills the stomach without evidence of contrast leak. IMPRESSION: Gastrostomy tube is in the stomach. No evidence of contrast leak on single frontal view of  the abdomen. Electronically Signed   By: Maurine Simmering M.D.   On: 01/31/2022 09:07    Anti-infectives: Anti-infectives (From admission, onward)    Start     Dose/Rate Route Frequency Ordered Stop   01/01/22 2100  piperacillin-tazobactam (ZOSYN) IVPB 3.375 g  Status:  Discontinued        3.375 g 12.5 mL/hr over 240 Minutes Intravenous Every 8 hours 01/01/22 2042 01/10/22 1333   11/25/21 1000  piperacillin-tazobactam (ZOSYN) IVPB 3.375 g  Status:  Discontinued        3.375 g 12.5 mL/hr over 240 Minutes Intravenous Every 8 hours 11/25/21 0909 12/01/21 0822   11/17/21 1830  piperacillin-tazobactam (ZOSYN) IVPB 3.375 g        3.375 g 12.5 mL/hr over 240 Minutes Intravenous Every 8 hours 11/17/21 1415 11/22/21 2359   11/17/21 1230  piperacillin-tazobactam (ZOSYN) IVPB 3.375 g        3.375 g 100 mL/hr over 30 Minutes Intravenous  Once 11/17/21 1220 11/17/21 1304       Assessment/Plan: 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 Harlow Asa 11/27 - path: necrotic colon at site of perforation, no malignancy or dysplasia noted  - Ileostomy output improved. Continue BID metamucil.  - WOC following for ileostomy, mucous fistula -  Daily WTD dressing  - Pathology benign. CT distal descending/proximal sigmoid bowel luminal collapse. - CT 1/11 - some extravasated oral contrast in the  subcutaneous abdominal wall superior and lateral to the ostomy at this level. There is inflammatory tissue at the level of the ostomy extending superiorly abutting the inferior tip of the liver and fundus of the gallbladder similar to the prior examination. There is likely some early fluid collection development anterior to the right kidney image 7/31 measuring 1.9 by 1.4 by 3.7 cm. Previously described fluid collections near the ostomy are not as well defined on the current study. Ill-defined omental inflammation and fluid in the lower abdomen and  left upper quadrant appears unchanged and may be related to omental infarct. There is no free intraperitoneal air or ascites. Small air-fluid collection at the level of the umbilicus is again seen containing air. This collection measures 2.5 x 0.7 by 1.3 cm and has mildly increased in size. Air in the collection is new from prior. - zosyn completed for last CT scan findings and UTI on 1/20 - Dr. Hassell Done stopped her TNA on 1/17 - repeat CT 2/1: resolution in RLQ stranding and resolving hematoma/seroma, bilateral common femoral DVTs - Family consented to PEG placement 2/2 and this was done 2/3. TF at goal.    Bilateral femoral DVTs - noted on repeat CT AP 2/1, on eliquis   FEN - Dysphagia 2 diet/but won't eat; PEG placed 2/3 and patient is tolerating TF at goal   VTE - SCDs, eliquis ID - zosyn 11/27>12/2, 12/5>12/11, 1/11> 1/20; repeating blood (ngtd)  and urine cxs (does not appear collected), WBC normal and pt is afebrile   - encephalopathy/FTT -  On folic acid and thiamine supplementation. Appreciate TRH following as well.   - psych saw 12/27 and determined patient does not have capacity   Dispo: working on placement. Patient is medically stable for discharge at this point.  Rolm Bookbinder 02/01/2022

## 2022-02-01 NOTE — Progress Notes (Signed)
PROGRESS NOTE    Sherry Hampton  KVQ:[id number] DOB: 05-16-1956 DOA: 11/17/2021 PCP: Pcp, No  No chief complaint on file.   Brief Narrative:  66 year old woman according to chart was independent, living alone and driving and continue to work as needed.  PMH including hyperlipidemia, possibly some memory loss, presented 11/27 with sudden abdominal pain.  Underwent emergent surgery for perforated viscus of the ascending colon with fecal peritonitis same day.  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  Hospitalization has been complicated by fluid collections in the abdomen, malnutrition, currently on TNA, intermittent encephalopathy and failure to thrive and progressive decline in strength and ability to participate with physical therapy.    See initial consult note from Dr. [name] on 1/7 for additional details.  Completed course of antibiotics for enterococcus UTI and intraabdominal and subcutaneous fluid collections.  She remains deconditioned with abnormal neuro exam and encephalopathy.  Status post PEG tube placement.  Noted to have bilateral femoral DVTs on anticoagulation with Eliquis.  Medically stable.  Placement pending.     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 Active Problems:   Perforated abdominal viscus   Goals of care, counseling/discussion   Acute metabolic encephalopathy   Abnormal neurological exam   Acute bilateral deep vein thrombosis (DVT) of femoral veins (HCC)   Fever   S/P percutaneous endoscopic gastrostomy (PEG) tube placement (HCC)   Hypercalcemia   Folate deficiency   Vitamin B6 deficiency   Protein-calorie malnutrition, severe   Common bile duct dilatation   Enterococcus UTI   Memory loss   Delirium   GERD (gastroesophageal reflux disease)   Hyperlipidemia   Hyponatremia   Acute  blood loss anemia   Ileostomy in place [facility])   Fecal peritonitis (HCC)   Hypophosphatemia   Assessment and Plan: Perforated abdominal viscus- (present on admission) --per surgery S/p ex lap, extended right colectomy, end ileostomy, mucous fistula 11/17/2021 CT 2/1 with near complete resolution of heterogenous fat stranding and fluid in low right hemiabdomen with residual tubular and bandlike elements in vicinity (c/w resolving post op hematoma/seroma with fat necrosis)  Abnormal neurological exam Exam difficult, inconsistent MRI c spine recommended 12/21, MRI c spine with C6-7 severe bilateral neural foraminal narrowing, C5-6 severe L and mild R enural foraminal narrowing, C3-4 and C4-5 mild to moderate L neural foraminal narrowing MRI brain without acute abnormality MRI T and L spine done as well without findings that I think would explain her weakness, reviewed with neuro Copper wnl, vitamin E (wnl), vitamin b1 (elevated), and vitamin b6 (low).  Vitamin D wnl. Continue folate and niacin supplementation per neuro EEG with generalized slowing EMG/NCS will need to be outpatient Discuss with neuro again as needed   Acute metabolic encephalopathy- (present on admission) alert, but obviously confused - stable, not changing meaningfully See 12/28/2021 initial consult note [name]) for review of the chart and extensive work-up. Previously with normal ammonia, TSH, HIV S/p high dose thiamine, continue supplemenation MRI brain 12/21 without acute abnormality Repeat MRI brain 1/15 without acute findings EEG generalized slowing c/w encephalopathy MRI C spine with C6-7 severe bilateral neural foraminal narrowing, C5-6 severe L and mild R neural foraminal narrowing, C3-4 and C4-5 mild to moderate L neural foraminal narrowing Neurology evaluated and delirium thought 2/2 prolonged hospitalization, sleep wake disturbance, nutritional def, critical illness Continued encephalopathy multifactorial,  related to infection (above -  now on abx), prolonged hospitalization, nutritional def, critical illness --- follow with antibiotics, nutrition, etc B6 and folic acid supplementation Delirium precautions    S/P percutaneous endoscopic gastrostomy (PEG) tube placement ([place]) 2/3 upper endoscopy with PEG See ethics note from 1/31 by Dr. Cathlean Sauer Concern for drainage around g tube, no evidence of contrast leak on x ray  Fever Temp 100 on 2/6 1 of 2 cx with staph epi from 1/29 (aerobic only) 1 of 2 cx with staph epi from 1/27 (aerobic only) 2/8 blood cx NGTD x2 days Repeat urine cx pending per surgery - with enterococcus, will follow cultures - hold abx for now without clear sx  Acute bilateral deep vein thrombosis (DVT) of femoral veins (HCC) Currently on eliquis Noted on CT 2/1 abd/pelvis  Vitamin B6 deficiency replace  Folate deficiency supplementation  Hypercalcemia Relatively stable recently, follow intermittently PTH 44 -> suggests non parathyroid hypercalcemia follow PTHrp (low), vitamin D 1, 25 (slightly low)  Enterococcus UTI S/p treatment Repeat urine cx positive - will await cx - does not obviously seem sx at this time  Common bile duct dilatation Noted incidentally on CT scan Bili and alk phos wnl Will defer additional imaging to surgery  Protein-calorie malnutrition, severe Continue nutrition as per surgery  Memory loss According to the chart mild prior to admission, patient was independent and still working at times   DVT prophylaxis: eliquis Code Status: full Family Communication: none Disposition:   Status is: Inpatient Remains inpatient appropriate because: no safe discharge plan   Consultants Surgery is primary, [facility] is consulting Palliative care Psych neurology  Procedures:  Status post exploratory laparotomy with right colectomy and ileostomy on 11/17/2021 PEG tube placement 01/24/2022  Antimicrobials:  Anti-infectives (From admission,  onward)    Start     Dose/Rate Route Frequency Ordered Stop   01/01/22 2100  piperacillin-tazobactam (ZOSYN) IVPB 3.375 g  Status:  Discontinued        3.375 g 12.5 mL/hr over 240 Minutes Intravenous Every 8 hours 01/01/22 2042 01/10/22 1333   11/25/21 1000  piperacillin-tazobactam (ZOSYN) IVPB 3.375 g  Status:  Discontinued        3.375 g 12.5 mL/hr over 240 Minutes Intravenous Every 8 hours 11/25/21 0909 12/01/21 0822   11/17/21 1830  piperacillin-tazobactam (ZOSYN) IVPB 3.375 g        3.375 g 12.5 mL/hr over 240 Minutes Intravenous Every 8 hours 11/17/21 1415 11/22/21 2359   11/17/21 1230  piperacillin-tazobactam (ZOSYN) IVPB 3.375 g        3.375 g 100 mL/hr over 30 Minutes Intravenous  Once 11/17/21 1220 11/17/21 1304       Subjective: No complaints Remains difficult to communicate with   Objective: Vitals:   01/31/22 0545 01/31/22 1320 01/31/22 2057 02/01/22 0625  BP:  120/71 114/69 119/68  Pulse:  (!) 108 (!) 101 95  Resp:  '14 16 18  ' Temp:  98.6 F (37 C) 97.8 F (36.6 C) 98.4 F (36.9 C)  TempSrc:  Oral Oral Oral  SpO2:  99% 99% 100%  Weight: 77.6 kg     Height:        Intake/Output Summary (Last 24 hours) at 02/01/2022 1157 Last data filed at 02/01/2022 0938 Gross per 24 hour  Intake 740 ml  Output 2100 ml  Net -1360 ml   Filed Weights   01/26/22 0457 01/27/22 0510 01/31/22 0545  Weight: 77.3 kg 75.7 kg 77.6 kg    Examination:  General: chronically ill appearing Cardiovascular: RRR  Lungs: unlabored  Abdomen: Soft - ostomy, midline dressing, mucous fistula, peg Neurological: persistently confused Skin: Warm and dry. No rashes or lesions. Extremities: No clubbing or cyanosis. No edema.   Data Reviewed: I have personally reviewed following labs and imaging studies  CBC: Recent Labs  Lab 01/26/22 0829 01/27/22 0409 01/28/22 0437 01/30/22 0410 02/01/22 0434  WBC 12.0* 11.0* 11.4* 11.0* 9.4  NEUTROABS  --   --   --  7.8*  --   HGB 8.7* 7.9*  8.6* 8.4* 7.9*  HCT 29.8* 26.6* 29.0* 28.3* 26.5*  MCV 87.9 86.4 87.3 85.5 87.2  PLT 305 282 324 364 932    Basic Metabolic Panel: Recent Labs  Lab 01/26/22 0829 01/27/22 0409 01/28/22 0437 01/29/22 0418 01/30/22 0410 01/31/22 0420 02/01/22 0434  NA 139  --  138  --  134*  --  138  K 3.9   < > 4.0 4.1 4.3 3.8 3.9  CL 109  --  107  --  102  --  105  CO2 25  --  28  --  29  --  29  GLUCOSE 128*  --  144*  --  139*  --  141*  BUN 6*  --  8  --  11  --  17  CREATININE 0.37*  --  0.33*  --  0.34*  --  0.32*  CALCIUM 9.1  --  9.7  --  9.6  --  10.1  MG 2.0   < > 1.8 2.1 1.9 1.9 1.9  PHOS 3.5   < > 2.0* 2.3* 3.2 2.4* 2.7   < > = values in this interval not displayed.    GFR: Estimated Creatinine Clearance: 73.8 mL/min (Yuleidy Rappleye) (by C-G formula based on SCr of 0.32 mg/dL (L)).  Liver Function Tests: Recent Labs  Lab 01/28/22 0437 01/30/22 0410 02/01/22 0434  AST 19 19  --   ALT 15 14  --   ALKPHOS 78 83  --   BILITOT 0.1* <0.1*  --   PROT 7.7 8.0  --   ALBUMIN 1.8* 1.8* 1.9*    CBG: Recent Labs  Lab 01/31/22 1947 01/31/22 2342 02/01/22 0423 02/01/22 0716 02/01/22 1123  GLUCAP 130* 138* 140* 130* 100*     Recent Results (from the past 240 hour(s))  Culture, blood (routine x 2)     Status: None (Preliminary result)   Collection Time: 01/29/22  8:01 PM   Specimen: BLOOD  Result Value Ref Range Status   Specimen Description   Final    BLOOD BLOOD RIGHT HAND Performed at [facility], [place] [address] [place], [place] [address]    Special Requests   Final    BOTTLES DRAWN AEROBIC ONLY Blood Culture adequate volume Performed at [place] [address], [place], [place] [address]    Culture   Final    NO GROWTH 3 DAYS Performed at [facility], [place] [address], [place], [place] [address]    Report Status PENDING  Incomplete  Culture, blood (routine x 2)     Status: None (Preliminary result)   Collection Time:  01/29/22  8:01 PM   Specimen: BLOOD  Result Value Ref Range Status   Specimen Description   Final    BLOOD LEFT ANTECUBITAL Performed at [place] [address], [place],  [address]    Special Requests   Final    BOTTLES DRAWN AEROBIC ONLY Blood Culture adequate volume  Performed at [facility], [place] [address], [place], [place] [address]    Culture   Final    NO GROWTH 3 DAYS Performed at [facility], [place] [address] [place], [place] [address]    Report Status PENDING  Incomplete  Urine Culture     Status: Abnormal (Preliminary result)   Collection Time: 01/30/22  5:37 PM   Specimen: In/Out Cath Urine  Result Value Ref Range Status   Specimen Description   Final    IN/OUT CATH URINE Performed at [place] [address], [place], [place] [address]    Special Requests   Final    NONE Performed at [facility], [place] [address] [place], [place] [address]    Culture >=100,000 COLONIES/mL ENTEROCOCCUS FAECALIS (Marena Witts)  Final   Report Status PENDING  Incomplete         Radiology Studies: DG ABDOMEN PEG TUBE LOCATION  Result Date: 01/31/2022 CLINICAL DATA:  Gastrostomy tube location/patency EXAM: ABDOMEN - 1 VIEW COMPARISON:  CT 01/22/2022 FINDINGS: There is no evidence of bowel obstruction. There is Ariauna Farabee percutaneous gastrostomy tube overlying the stomach. 50 mL of Omnipaque 300 was injected into the existing tube and flushed with sterile water. Contrast fills the stomach without evidence of contrast leak. IMPRESSION: Gastrostomy tube is in the stomach. No evidence of contrast leak on single frontal view of the abdomen. Electronically Signed   By: Maurine Simmering M.D.   On: 01/31/2022 09:07        Scheduled Meds:  apixaban  10 mg Per Tube BID   Followed by   Derrill Memo ON 02/02/2022] apixaban  5 mg Per Tube BID   chlorhexidine  15 mL Mouth Rinse BID   ferrous sulfate  220 mg Per Tube BID WC    folic acid  1 mg Per Tube Daily   free water  200 mL Per Tube Q6H   insulin aspart  0-9 Units Subcutaneous Q4H   mouth rinse  15 mL Mouth Rinse q12n4p   multivitamin  15 mL Per Tube Daily   niacin  100 mg Per Tube QHS   pantoprazole sodium  40 mg Per Tube Daily   psyllium  1 packet Oral BID   vitamin B-6  100 mg Per Tube Daily   sodium hypochlorite   Irrigation BID   thiamine  100 mg Per Tube BID   Continuous Infusions:  sodium chloride 10 mL/hr at 01/31/22 0616   feeding supplement (OSMOLITE 1.5 CAL) 1,000 mL (01/31/22 2332)     LOS: 76 days    Time spent: over 30 min    Fayrene Helper, MD [facility]   To contact the attending provider between 7A-7P or the covering provider during after hours 7P-7A, please log into the web site [email or web address] and access using universal [place] password for that web site. If you do not have the password, please call the hospital operator.  02/01/2022, 11:57 AM

## 2022-02-01 NOTE — Progress Notes (Signed)
Withheld free water this evening due to the patient acting like it hurt when it was pushed in. The tube feeding seems to be ok.

## 2022-02-02 LAB — BASIC METABOLIC PANEL
Anion gap: 4 — ABNORMAL LOW (ref 5–15)
BUN: 17 mg/dL (ref 8–23)
CO2: 30 mmol/L (ref 22–32)
Calcium: 10.1 mg/dL (ref 8.9–10.3)
Chloride: 104 mmol/L (ref 98–111)
Creatinine, Ser: <0.3 mg/dL — ABNORMAL LOW (ref 0.44–1.00)
Glucose, Bld: 107 mg/dL — ABNORMAL HIGH (ref 70–99)
Potassium: 3.7 mmol/L (ref 3.5–5.1)
Sodium: 138 mmol/L (ref 135–145)

## 2022-02-02 LAB — CBC
HCT: 23.5 % — ABNORMAL LOW (ref 36.0–46.0)
Hemoglobin: 7 g/dL — ABNORMAL LOW (ref 12.0–15.0)
MCH: 25.8 pg — ABNORMAL LOW (ref 26.0–34.0)
MCHC: 29.8 g/dL — ABNORMAL LOW (ref 30.0–36.0)
MCV: 86.7 fL (ref 80.0–100.0)
Platelets: 372 10*3/uL (ref 150–400)
RBC: 2.71 MIL/uL — ABNORMAL LOW (ref 3.87–5.11)
RDW: 18.6 % — ABNORMAL HIGH (ref 11.5–15.5)
WBC: 10 10*3/uL (ref 4.0–10.5)
nRBC: 0.2 % (ref 0.0–0.2)

## 2022-02-02 LAB — GLUCOSE, CAPILLARY
Glucose-Capillary: 130 mg/dL — ABNORMAL HIGH (ref 70–99)
Glucose-Capillary: 130 mg/dL — ABNORMAL HIGH (ref 70–99)
Glucose-Capillary: 141 mg/dL — ABNORMAL HIGH (ref 70–99)
Glucose-Capillary: 113 mg/dL — ABNORMAL HIGH (ref 70–99)
Glucose-Capillary: 133 mg/dL — ABNORMAL HIGH (ref 70–99)

## 2022-02-02 LAB — URINE CULTURE: Culture: >=100000 — AB

## 2022-02-02 LAB — MAGNESIUM: Magnesium: 2 mg/dL (ref 1.7–2.4)

## 2022-02-02 LAB — HEMOGLOBIN AND HEMATOCRIT, BLOOD
HCT: 26.8 % — ABNORMAL LOW (ref 36.0–46.0)
Hemoglobin: 7.8 g/dL — ABNORMAL LOW (ref 12.0–15.0)

## 2022-02-02 LAB — ALBUMIN: Albumin: 1.8 g/dL — ABNORMAL LOW (ref 3.5–5.0)

## 2022-02-02 LAB — ABO/RH: ABO/RH(D): B POS

## 2022-02-02 LAB — PHOSPHORUS: Phosphorus: 2.7 mg/dL (ref 2.5–4.6)

## 2022-02-02 MED ORDER — AMOXICILLIN 250 MG/5ML PO SUSR
500.0000 mg | Freq: Three times a day (TID) | ORAL | Status: DC
  Administered 2022-02-02 – 2022-02-05 (×8): 500 mg
  Filled 2022-02-02 (×10): qty 10

## 2022-02-02 MED ORDER — SODIUM CHLORIDE 0.9 % IV SOLN: INTRAVENOUS | Status: DC

## 2022-02-02 NOTE — Progress Notes (Signed)
PROGRESS NOTE    Sherry Hampton  JKK:[id number] DOB: [birth year] DOA: 11/17/2021 PCP: Pcp, No  No chief complaint on file.   Brief Narrative:  66 year old woman according to chart was independent, living alone and driving and continue to work as needed.  PMH including hyperlipidemia, possibly some memory loss, presented 11/27 with sudden abdominal pain.  Underwent emergent surgery for perforated viscus of the ascending colon with fecal peritonitis same day.  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  Hospitalization has been complicated by fluid collections in the abdomen, malnutrition, currently on TNA, intermittent encephalopathy and failure to thrive and progressive decline in strength and ability to participate with physical therapy.    See initial consult note from Dr. [name] on 1/7 for additional details.  Completed course of antibiotics for enterococcus UTI and intraabdominal and subcutaneous fluid collections.  She remains deconditioned with abnormal neuro exam and encephalopathy.  Status post PEG tube placement.  Noted to have bilateral femoral DVTs on anticoagulation with Eliquis.  Medically stable.  Placement pending.     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 Active Problems:   Perforated abdominal viscus   Goals of care, counseling/discussion   Acute metabolic encephalopathy   Abnormal neurological exam   Acute bilateral deep vein thrombosis (DVT) of femoral veins (HCC)   Fever   S/P percutaneous endoscopic gastrostomy (PEG) tube placement (HCC)   Anemia   Hypercalcemia   Folate deficiency   Vitamin B6 deficiency   Protein-calorie malnutrition, severe   Common bile duct dilatation   Enterococcus UTI   Memory loss   Delirium   GERD (gastroesophageal reflux disease)   Hyperlipidemia   Hyponatremia    Ileostomy in place [facility])   Fecal peritonitis ([place])   Hypophosphatemia   Assessment and Plan: Perforated abdominal viscus- (present on admission) --per surgery S/p ex lap, extended right colectomy, end ileostomy, mucous fistula 11/17/2021 CT 2/1 with near complete resolution of heterogenous fat stranding and fluid in low right hemiabdomen with residual tubular and bandlike elements in vicinity (c/w resolving post op hematoma/seroma with fat necrosis)  Abnormal neurological exam Exam difficult, inconsistent MRI c spine recommended 12/21, MRI c spine with C6-7 severe bilateral neural foraminal narrowing, C5-6 severe L and mild R enural foraminal narrowing, C3-4 and C4-5 mild to moderate L neural foraminal narrowing MRI brain without acute abnormality MRI T and L spine done as well without findings that I think would explain her weakness, reviewed with neuro Copper wnl, vitamin E (wnl), vitamin b1 (elevated), and vitamin b6 (low).  Vitamin D wnl. Continue folate and niacin supplementation per neuro EEG with generalized slowing EMG/NCS will need to be outpatient Discuss with neuro again as needed   Acute metabolic encephalopathy- (present on admission) alert, but obviously confused - stable, not changing meaningfully See 12/28/2021 initial consult note [name]) for review of the chart and extensive work-up. Previously with normal ammonia, TSH, HIV S/p high dose thiamine, continue supplemenation MRI brain 12/21 without acute abnormality Repeat MRI brain 1/15 without acute findings EEG generalized slowing c/w encephalopathy MRI C spine with C6-7 severe bilateral neural foraminal narrowing, C5-6 severe L and mild R neural foraminal narrowing, C3-4 and C4-5 mild to moderate L neural foraminal narrowing Neurology evaluated and delirium thought 2/2 prolonged hospitalization, sleep wake disturbance, nutritional def, critical illness Continued encephalopathy multifactorial, related to infection  (above - now on  abx), prolonged hospitalization, nutritional def, critical illness --- follow with antibiotics, nutrition, etc B6 and folic acid supplementation Delirium precautions    S/P percutaneous endoscopic gastrostomy (PEG) tube placement ([place]) 2/3 upper endoscopy with PEG See ethics note from 1/31 by Dr. Cathlean Sauer Concern for drainage around g tube, no evidence of contrast leak on x ray  Fever Temp 100 on 2/6 1 of 2 cx with staph epi from 1/29 (aerobic only) 1 of 2 cx with staph epi from 1/27 (aerobic only) 2/8 blood cx NGTD x2 days Repeat urine cx pending per surgery - enterococcus sensitive to ampicillin, will treat with amoxicillin   Acute bilateral deep vein thrombosis (DVT) of femoral veins (HCC) Currently on eliquis Noted on CT 2/1 abd/pelvis Hb has notably trended down - repeat and transfuse as appropriate  Vitamin B6 deficiency replace  Folate deficiency supplementation  Hypercalcemia Relatively stable recently, follow intermittently IVF PTH 44 -> suggests non parathyroid hypercalcemia follow PTHrp (low), vitamin D 1, 25 (slightly low), normal vitamin D 25 hydroxy Consider additional w/u  Anemia- (present on admission) Gradual downtrend She's on anticoagulation Repeat, type and screen Transfuse as indicated  Enterococcus UTI S/p treatment Repeat urine cx positive - difficult to tell if symptoms with mental status, will treat as above (amoxicillin x 5 days)  Common bile duct dilatation Noted incidentally on CT scan Bili and alk phos wnl Will defer additional imaging to surgery  Protein-calorie malnutrition, severe Continue nutrition as per surgery  Memory loss According to the chart mild prior to admission, patient was independent and still working at times   DVT prophylaxis: eliquis Code Status: full Family Communication: none Disposition:   Status is: Inpatient Remains inpatient appropriate because: no safe discharge plan    Consultants Surgery is primary, [facility] is consulting Palliative care Psych neurology  Procedures:  Status post exploratory laparotomy with right colectomy and ileostomy on 11/17/2021 PEG tube placement 01/24/2022  Antimicrobials:  Anti-infectives (From admission, onward)    Start     Dose/Rate Route Frequency Ordered Stop   02/02/22 1400  amoxicillin (AMOXIL) 250 MG/5ML suspension 500 mg        500 mg Per Tube Every 8 hours 02/02/22 1154 02/07/22 1359   01/01/22 2100  piperacillin-tazobactam (ZOSYN) IVPB 3.375 g  Status:  Discontinued        3.375 g 12.5 mL/hr over 240 Minutes Intravenous Every 8 hours 01/01/22 2042 01/10/22 1333   11/25/21 1000  piperacillin-tazobactam (ZOSYN) IVPB 3.375 g  Status:  Discontinued        3.375 g 12.5 mL/hr over 240 Minutes Intravenous Every 8 hours 11/25/21 0909 12/01/21 0822   11/17/21 1830  piperacillin-tazobactam (ZOSYN) IVPB 3.375 g        3.375 g 12.5 mL/hr over 240 Minutes Intravenous Every 8 hours 11/17/21 1415 11/22/21 2359   11/17/21 1230  piperacillin-tazobactam (ZOSYN) IVPB 3.375 g        3.375 g 100 mL/hr over 30 Minutes Intravenous  Once 11/17/21 1220 11/17/21 1304       Subjective: Says she doesn't feel well But can't elaborate Doesn't let me complete my exam today  Objective: Vitals:   02/01/22 0625 02/01/22 1300 02/01/22 2109 02/02/22 0500  BP: 119/68 121/71 123/70 112/68  Pulse: 95 (!) 102 94 92  Resp: '18 18 16 16  ' Temp: 98.4 F (36.9 C) 97.9 F (36.6 C) 98.2 F (36.8 C) 98 F (36.7 C)  TempSrc: Oral Oral Oral Oral  SpO2: 100%  100% 98%  Weight:  Height:        Intake/Output Summary (Last 24 hours) at 02/02/2022 1204 Last data filed at 02/02/2022 1000 Gross per 24 hour  Intake 527.33 ml  Output 1800 ml  Net -1272.67 ml   Filed Weights   01/26/22 0457 01/27/22 0510 01/31/22 0545  Weight: 77.3 kg 75.7 kg 77.6 kg    Examination:  General: No acute distress. Cardiovascular: RRR Lungs:  unlabored Abdomen: Soft, nontender, nondistended - she does not allow me to look under gown today at her abdomen Neurological: alert, conversant, but remains confused and difficult to understand at times with inconsistent following of commands Extremities: No clubbing or cyanosis. No edema  Data Reviewed: I have personally reviewed following labs and imaging studies  CBC: Recent Labs  Lab 01/27/22 0409 01/28/22 0437 01/30/22 0410 02/01/22 0434 02/02/22 0427  WBC 11.0* 11.4* 11.0* 9.4 10.0  NEUTROABS  --   --  7.8*  --   --   HGB 7.9* 8.6* 8.4* 7.9* 7.0*  HCT 26.6* 29.0* 28.3* 26.5* 23.5*  MCV 86.4 87.3 85.5 87.2 86.7  PLT 282 324 364 362 426    Basic Metabolic Panel: Recent Labs  Lab 01/28/22 0437 01/29/22 0418 01/30/22 0410 01/31/22 0420 02/01/22 0434 02/02/22 0427  NA 138  --  134*  --  138 138  K 4.0 4.1 4.3 3.8 3.9 3.7  CL 107  --  102  --  105 104  CO2 28  --  29  --  29 30  GLUCOSE 144*  --  139*  --  141* 107*  BUN 8  --  11  --  17 17  CREATININE 0.33*  --  0.34*  --  0.32* <0.30*  CALCIUM 9.7  --  9.6  --  10.1 10.1  MG 1.8 2.1 1.9 1.9 1.9 2.0  PHOS 2.0* 2.3* 3.2 2.4* 2.7 2.7    GFR: CrCl cannot be calculated (This lab value cannot be used to calculate CrCl because it is not Krishawna Stiefel number: <0.30).  Liver Function Tests: Recent Labs  Lab 01/28/22 0437 01/30/22 0410 02/01/22 0434 02/02/22 0427  AST 19 19  --   --   ALT 15 14  --   --   ALKPHOS 78 83  --   --   BILITOT 0.1* <0.1*  --   --   PROT 7.7 8.0  --   --   ALBUMIN 1.8* 1.8* 1.9* 1.8*    CBG: Recent Labs  Lab 02/01/22 1545 02/01/22 1955 02/01/22 2343 02/02/22 0406 02/02/22 0737  GLUCAP 142* 142* 106* 130* 130*     Recent Results (from the past 240 hour(s))  Culture, blood (routine x 2)     Status: None (Preliminary result)   Collection Time: 01/29/22  8:01 PM   Specimen: BLOOD  Result Value Ref Range Status   Specimen Description   Final    BLOOD BLOOD RIGHT HAND Performed at  [facility], [place] [address], [place], [place] [address]    Special Requests   Final    BOTTLES DRAWN AEROBIC ONLY Blood Culture adequate volume Performed at [facility] [address] [place], [place] [address]    Culture   Final    NO GROWTH 4 DAYS Performed at [facility], [place] [address] [place], [place] [address]    Report Status PENDING  Incomplete  Culture, blood (routine x 2)     Status: None (Preliminary result)   Collection Time: 01/29/22  8:01 PM   Specimen: BLOOD  Result Value Ref Range Status   Specimen Description   Final    BLOOD LEFT ANTECUBITAL Performed at [facility] [address], [place], [place] [address]    Special Requests   Final    BOTTLES DRAWN AEROBIC ONLY Blood Culture adequate volume Performed at [facility] [address], [place], [place] [address]    Culture   Final    NO GROWTH 4 DAYS Performed at [facility], [place] [address] [place], [place] [address]    Report Status PENDING  Incomplete  Urine Culture     Status: Abnormal   Collection Time: 01/30/22  5:37 PM   Specimen: In/Out Cath Urine  Result Value Ref Range Status   Specimen Description   Final    IN/OUT CATH URINE Performed at [facility] [address] [place], [place] [address]    Special Requests   Final    NONE Performed at [facility], [place] [address] [place], [place] [address]    Culture >=100,000 COLONIES/mL ENTEROCOCCUS FAECALIS (Ladelle Teodoro)  Final   Report Status 02/02/2022 FINAL  Final   Organism ID, Bacteria ENTEROCOCCUS FAECALIS (Joclynn Lumb)  Final      Susceptibility   Enterococcus faecalis - MIC*    AMPICILLIN <=2 SENSITIVE Sensitive     NITROFURANTOIN <=16 SENSITIVE Sensitive     VANCOMYCIN 2 SENSITIVE Sensitive     * >=100,000 COLONIES/mL ENTEROCOCCUS FAECALIS         Radiology Studies: No results found.      Scheduled Meds:   amoxicillin  500 mg Per Tube Q8H   apixaban  5 mg Per Tube BID   chlorhexidine  15 mL Mouth Rinse BID   ferrous sulfate  220 mg Per Tube BID WC   folic acid  1 mg Per Tube Daily   free water  200 mL Per Tube Q6H   insulin aspart  0-9 Units Subcutaneous Q4H   mouth rinse  15 mL Mouth Rinse q12n4p   multivitamin  15 mL Per Tube Daily   niacin  100 mg Per Tube QHS   pantoprazole sodium  40 mg Per Tube Daily   psyllium  1 packet Oral BID   vitamin B-6  100 mg Per Tube Daily   thiamine  100 mg Per Tube BID   Continuous Infusions:  sodium chloride 10 mL/hr at 01/31/22 0616   sodium chloride     feeding supplement (OSMOLITE 1.5 CAL) 1,000 mL (02/02/22 1055)     LOS: 77 days    Time spent: over 30 min    Fayrene Helper, MD [facility]   To contact the attending provider between 7A-7P or the covering provider during after hours 7P-7A, please log into the web site [email or web address] and access using universal [place] password for that web site. If you do not have the password, please call the hospital operator.  02/02/2022, 12:04 PM

## 2022-02-02 NOTE — Assessment & Plan Note (Addendum)
S/p 1 unit pRBC Hb appropriate and stable after transfusion

## 2022-02-02 NOTE — Progress Notes (Signed)
9 Days Post-Op   Subjective/Chief Complaint: Responsive this am, pushing through tube bothered once but not now   Objective: Vital signs in last 24 hours: Temp:  [97.9 F (36.6 C)-98.2 F (36.8 C)] 98 F (36.7 C) (02/12 0500) Pulse Rate:  [92-102] 92 (02/12 0500) Resp:  [16-18] 16 (02/12 0500) BP: (112-123)/(68-71) 112/68 (02/12 0500) SpO2:  [98 %-100 %] 98 % (02/12 0500) Last BM Date: 02/01/22  Intake/Output from previous day: 02/11 0701 - 02/12 0700 In: 1536.7 [P.O.:300; NG/GT:1236.7] Out: 1500 [Urine:1100; Stool:400] Intake/Output this shift: No intake/output data recorded.  General ill appearing, responsive this am Cv regular Pulm effort normal Ab soft wound clean, ostomy/MF and peg site clean and productive  Lab Results:  Recent Labs    02/01/22 0434 02/02/22 0427  WBC 9.4 10.0  HGB 7.9* 7.0*  HCT 26.5* 23.5*  PLT 362 372   BMET Recent Labs    02/01/22 0434 02/02/22 0427  NA 138 138  K 3.9 3.7  CL 105 104  CO2 29 30  GLUCOSE 141* 107*  BUN 17 17  CREATININE 0.32* <0.30*  CALCIUM 10.1 10.1   PT/INR No results for input(s): LABPROT, INR in the last 72 hours. ABG No results for input(s): PHART, HCO3 in the last 72 hours.  Invalid input(s): PCO2, PO2  Studies/Results: DG ABDOMEN PEG TUBE LOCATION  Result Date: 01/31/2022 CLINICAL DATA:  Gastrostomy tube location/patency EXAM: ABDOMEN - 1 VIEW COMPARISON:  CT 01/22/2022 FINDINGS: There is no evidence of bowel obstruction. There is a percutaneous gastrostomy tube overlying the stomach. 50 mL of Omnipaque 300 was injected into the existing tube and flushed with sterile water. Contrast fills the stomach without evidence of contrast leak. IMPRESSION: Gastrostomy tube is in the stomach. No evidence of contrast leak on single frontal view of the abdomen. Electronically Signed   By: Maurine Simmering M.D.   On: 01/31/2022 09:07    Anti-infectives: Anti-infectives (From admission, onward)    Start     Dose/Rate  Route Frequency Ordered Stop   01/01/22 2100  piperacillin-tazobactam (ZOSYN) IVPB 3.375 g  Status:  Discontinued        3.375 g 12.5 mL/hr over 240 Minutes Intravenous Every 8 hours 01/01/22 2042 01/10/22 1333   11/25/21 1000  piperacillin-tazobactam (ZOSYN) IVPB 3.375 g  Status:  Discontinued        3.375 g 12.5 mL/hr over 240 Minutes Intravenous Every 8 hours 11/25/21 0909 12/01/21 0822   11/17/21 1830  piperacillin-tazobactam (ZOSYN) IVPB 3.375 g        3.375 g 12.5 mL/hr over 240 Minutes Intravenous Every 8 hours 11/17/21 1415 11/22/21 2359   11/17/21 1230  piperacillin-tazobactam (ZOSYN) IVPB 3.375 g        3.375 g 100 mL/hr over 30 Minutes Intravenous  Once 11/17/21 1220 11/17/21 1304       Assessment/Plan: 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 Harlow Asa 11/27 - path: necrotic colon at site of perforation, no malignancy or dysplasia noted  - Ileostomy output improved. Continue BID metamucil.  - WOC following for ileostomy, mucous fistula -  Daily WTD dressing  - Pathology benign. CT distal descending/proximal sigmoid bowel luminal collapse. - Dr. Hassell Done stopped her TNA on 1/17 - repeat CT 2/1: resolution in RLQ stranding and resolving hematoma/seroma, bilateral common femoral DVTs - Family consented to PEG placement 2/2 and this was done 2/3. TF at goal.    Bilateral femoral DVTs -  noted on repeat CT AP 2/1, on eliquis, hb lower today, recheck written for later today and in am   FEN - Dysphagia 2 diet/but won't eat; PEG placed 2/3 and patient is tolerating TF at goal   VTE - SCDs, eliquis ID - zosyn 11/27>12/2, 12/5>12/11, 1/11> 1/20; repeating blood (ngtd)  and urine cxs , WBC normal and pt is afebrile- will defer to medicine but don't think needs any abx, not sure of why repeat urine cx ordered   - encephalopathy/FTT -  On folic acid and thiamine supplementation. Appreciate TRH following  as well.   - psych saw 12/27 and determined patient does not have capacity   Dispo: working on placement. Patient is medically stable for discharge at this point.  Rolm Bookbinder 02/02/2022

## 2022-02-03 LAB — BASIC METABOLIC PANEL
Anion gap: 3 — ABNORMAL LOW (ref 5–15)
BUN: 16 mg/dL (ref 8–23)
CO2: 29 mmol/L (ref 22–32)
Calcium: 10.1 mg/dL (ref 8.9–10.3)
Chloride: 104 mmol/L (ref 98–111)
Creatinine, Ser: <0.3 mg/dL — ABNORMAL LOW (ref 0.44–1.00)
Glucose, Bld: 135 mg/dL — ABNORMAL HIGH (ref 70–99)
Potassium: 3.9 mmol/L (ref 3.5–5.1)
Sodium: 136 mmol/L (ref 135–145)

## 2022-02-03 LAB — CULTURE, BLOOD (ROUTINE X 2)
Culture: NO GROWTH
Culture: NO GROWTH
Special Requests: ADEQUATE
Special Requests: ADEQUATE

## 2022-02-03 LAB — HEMOGLOBIN AND HEMATOCRIT, BLOOD
HCT: 27 % — ABNORMAL LOW (ref 36.0–46.0)
Hemoglobin: 8.3 g/dL — ABNORMAL LOW (ref 12.0–15.0)

## 2022-02-03 LAB — CBC
HCT: 23.5 % — ABNORMAL LOW (ref 36.0–46.0)
Hemoglobin: 6.9 g/dL — CL (ref 12.0–15.0)
MCH: 25.7 pg — ABNORMAL LOW (ref 26.0–34.0)
MCHC: 29.4 g/dL — ABNORMAL LOW (ref 30.0–36.0)
MCV: 87.7 fL (ref 80.0–100.0)
Platelets: 387 10*3/uL (ref 150–400)
RBC: 2.68 MIL/uL — ABNORMAL LOW (ref 3.87–5.11)
RDW: 19 % — ABNORMAL HIGH (ref 11.5–15.5)
WBC: 9.8 10*3/uL (ref 4.0–10.5)
nRBC: 0.3 % — ABNORMAL HIGH (ref 0.0–0.2)

## 2022-02-03 LAB — GLUCOSE, CAPILLARY
Glucose-Capillary: 115 mg/dL — ABNORMAL HIGH (ref 70–99)
Glucose-Capillary: 117 mg/dL — ABNORMAL HIGH (ref 70–99)
Glucose-Capillary: 121 mg/dL — ABNORMAL HIGH (ref 70–99)
Glucose-Capillary: 128 mg/dL — ABNORMAL HIGH (ref 70–99)
Glucose-Capillary: 131 mg/dL — ABNORMAL HIGH (ref 70–99)
Glucose-Capillary: 133 mg/dL — ABNORMAL HIGH (ref 70–99)
Glucose-Capillary: 139 mg/dL — ABNORMAL HIGH (ref 70–99)
Glucose-Capillary: 143 mg/dL — ABNORMAL HIGH (ref 70–99)

## 2022-02-03 LAB — PHOSPHORUS: Phosphorus: 2.9 mg/dL (ref 2.5–4.6)

## 2022-02-03 LAB — PREPARE RBC (CROSSMATCH)

## 2022-02-03 LAB — MAGNESIUM: Magnesium: 2 mg/dL (ref 1.7–2.4)

## 2022-02-03 MED ORDER — SODIUM CHLORIDE 0.9% IV SOLUTION: Freq: Once | INTRAVENOUS | Status: AC

## 2022-02-03 NOTE — Progress Notes (Addendum)
10 Days Post-Op   Subjective/Chief Complaint: Alert and responsive this AM.  No complaints.  RN and Radio producer at bedside.  Objective: Vital signs in last 24 hours: Temp:  [97.5 F (36.4 C)-98.7 F (37.1 C)] 97.5 F (36.4 C) (02/13 0646) Pulse Rate:  [95-102] 102 (02/13 0646) Resp:  [16-18] 18 (02/13 0646) BP: (99-126)/(62-76) 124/76 (02/13 0646) SpO2:  [100 %] 100 % (02/13 0646) Weight:  [76.6 kg] 76.6 kg (02/13 0324) Last BM Date: 02/01/22  Intake/Output from previous day: 02/12 0701 - 02/13 0700 In: 2728.7 [I.V.:748.7; NG/GT:1980] Out: 2375 [Urine:1900; Stool:475] Intake/Output this shift: No intake/output data recorded.  General chronically ill appearing, oriented to self but not able to report year or location CV RRR Pulm effort normal ORA Ab soft wound clean - small 2x2x2 cm wound at proximal aspect of incision with pink base,no purulence. Ostomy with gas and stool in pouch, MF clean and productive, and peg w/ TF running @ goal  Lab Results:  Recent Labs    02/02/22 0427 02/02/22 1214 02/03/22 0401  WBC 10.0  --  9.8  HGB 7.0* 7.8* 6.9*  HCT 23.5* 26.8* 23.5*  PLT 372  --  387   BMET Recent Labs    02/02/22 0427 02/03/22 0401  NA 138 136  K 3.7 3.9  CL 104 104  CO2 30 29  GLUCOSE 107* 135*  BUN 17 16  CREATININE <0.30* <0.30*  CALCIUM 10.1 10.1   PT/INR No results for input(s): LABPROT, INR in the last 72 hours. ABG No results for input(s): PHART, HCO3 in the last 72 hours.  Invalid input(s): PCO2, PO2  Studies/Results: No results found.  Anti-infectives: Anti-infectives (From admission, onward)    Start     Dose/Rate Route Frequency Ordered Stop   02/02/22 1400  amoxicillin (AMOXIL) 250 MG/5ML suspension 500 mg        500 mg Per Tube Every 8 hours 02/02/22 1154 02/07/22 1359   01/01/22 2100  piperacillin-tazobactam (ZOSYN) IVPB 3.375 g  Status:  Discontinued        3.375 g 12.5 mL/hr over 240 Minutes Intravenous Every 8 hours 01/01/22  2042 01/10/22 1333   11/25/21 1000  piperacillin-tazobactam (ZOSYN) IVPB 3.375 g  Status:  Discontinued        3.375 g 12.5 mL/hr over 240 Minutes Intravenous Every 8 hours 11/25/21 0909 12/01/21 0822   11/17/21 1830  piperacillin-tazobactam (ZOSYN) IVPB 3.375 g        3.375 g 12.5 mL/hr over 240 Minutes Intravenous Every 8 hours 11/17/21 1415 11/22/21 2359   11/17/21 1230  piperacillin-tazobactam (ZOSYN) IVPB 3.375 g        3.375 g 100 mL/hr over 30 Minutes Intravenous  Once 11/17/21 1220 11/17/21 1304       Assessment/Plan: 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 Harlow Asa 11/27 - path: necrotic colon at site of perforation, no malignancy or dysplasia noted  - Ileostomy output improved. Continue BID metamucil.  - WOC following for ileostomy, mucous fistula -  Daily WTD dressing  - Pathology benign. CT distal descending/proximal sigmoid bowel luminal collapse. - Dr. Hassell Done stopped her TNA on 1/17 - repeat CT 2/1: resolution in RLQ stranding and resolving hematoma/seroma, bilateral common femoral DVTs - Family consented to PEG placement 2/2 and this was done 2/3. TF at goal.    Bilateral femoral DVTs - noted on repeat CT AP 2/1, on eliquis, hb 6.9 today from  7.8 and 7.0 yesterday. No signs of significant bleeding. Mild sinus tachycardia this AM 102 bpm. May benefit from a unit of blood - will discuss with MD before ordering.  Addendum -- Dr. Johney Maine ordered a unit of blood for this patient. I attempted to call patients brother, leon, to discuss transfusion. I left a voicemail. I also called patients sister, [name]'s, cell phone which went straight to voicemail. I spoke with Dr. Florene Glen who states he just had a discussion with [name] regarding blood transfusion and these discussions are ongoing.  FEN - Dysphagia 2 diet/but won't eat; PEG placed 2/3 and patient is tolerating TF at goal   VTE - SCDs,  eliquis  ID - zosyn 11/27>12/2, 12/5>12/11, 1/11> 1/20; low grade fever 2/6 repeat blood Cx 2/8 (ngtd)  and urine cxs (2/9) , urine cx w/ enterococcus and medicate treated with amoxicillin WBC normal and pt is afebrile.   - encephalopathy/FTT -  On folic acid and thiamine supplementation. Appreciate TRH following as well.   - psych saw 12/27 and determined patient does not have capacity   Dispo: working on placement. Patient is medically stable for discharge at this point.  [name] 02/03/2022

## 2022-02-03 NOTE — Progress Notes (Signed)
PROGRESS NOTE    Sherry Hampton  FGH:[id number] DOB: [birth year] DOA: 11/17/2021 PCP: Pcp, No  No chief complaint on file.   Brief Narrative:  66 year old woman according to chart was independent, living alone and driving and continue to work as needed.  PMH including hyperlipidemia, possibly some memory loss, presented 11/27 with sudden abdominal pain.  Underwent emergent surgery for perforated viscus of the ascending colon with fecal peritonitis same day.  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  Hospitalization has been complicated by fluid collections in the abdomen, malnutrition, currently on TNA, intermittent encephalopathy and failure to thrive and progressive decline in strength and ability to participate with physical therapy.    See initial consult note from Dr. [name] on 1/7 for additional details.  Completed course of antibiotics for enterococcus UTI and intraabdominal and subcutaneous fluid collections.  She remains deconditioned with abnormal neuro exam and encephalopathy.  Status post PEG tube placement.  Noted to have bilateral femoral DVTs on anticoagulation with Eliquis.  Medically stable.  Placement pending.     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 Active Problems:   Perforated abdominal viscus   Goals of care, counseling/discussion   Acute metabolic encephalopathy   Abnormal neurological exam   Acute bilateral deep vein thrombosis (DVT) of femoral veins (HCC)   Fever   S/P percutaneous endoscopic gastrostomy (PEG) tube placement (HCC)   Anemia   Hypercalcemia   Folate deficiency   Vitamin B6 deficiency   Protein-calorie malnutrition, severe   Common bile duct dilatation   Enterococcus UTI   Memory loss   Delirium   GERD (gastroesophageal reflux disease)   Hyperlipidemia   Hyponatremia    Ileostomy in place [facility])   Fecal peritonitis ([place])   Hypophosphatemia   Assessment and Plan: Perforated abdominal viscus- (present on admission) --per surgery S/p ex lap, extended right colectomy, end ileostomy, mucous fistula 11/17/2021 CT 2/1 with near complete resolution of heterogenous fat stranding and fluid in low right hemiabdomen with residual tubular and bandlike elements in vicinity (c/w resolving post op hematoma/seroma with fat necrosis)  Abnormal neurological exam Exam difficult, inconsistent MRI c spine recommended 12/21, MRI c spine with C6-7 severe bilateral neural foraminal narrowing, C5-6 severe L and mild R enural foraminal narrowing, C3-4 and C4-5 mild to moderate L neural foraminal narrowing MRI brain without acute abnormality MRI T and L spine done as well without findings that I think would explain her weakness, reviewed with neuro Copper wnl, vitamin E (wnl), vitamin b1 (elevated), and vitamin b6 (low).  Vitamin D wnl. Continue folate and niacin supplementation per neuro EEG with generalized slowing EMG/NCS will need to be outpatient Discuss with neuro again as needed   Acute metabolic encephalopathy- (present on admission) alert, but obviously confused - stable, not changing meaningfully See 12/28/2021 initial consult note [name]) for review of the chart and extensive work-up. Previously with normal ammonia, TSH, HIV S/p high dose thiamine, continue supplemenation MRI brain 12/21 without acute abnormality Repeat MRI brain 1/15 without acute findings EEG generalized slowing c/w encephalopathy MRI C spine with C6-7 severe bilateral neural foraminal narrowing, C5-6 severe L and mild R neural foraminal narrowing, C3-4 and C4-5 mild to moderate L neural foraminal narrowing Neurology evaluated and delirium thought 2/2 prolonged hospitalization, sleep wake disturbance, nutritional def, critical illness Continued encephalopathy multifactorial, related to infection  (above - now on  abx), prolonged hospitalization, nutritional def, critical illness --- follow with antibiotics, nutrition, etc B6 and folic acid supplementation Delirium precautions    S/P percutaneous endoscopic gastrostomy (PEG) tube placement (HCC) 2/3 upper endoscopy with PEG See ethics note from 1/31 by Dr. Cathlean Sauer Concern for drainage around g tube, no evidence of contrast leak on x ray  Fever Temp 100 on 2/6 1 of 2 cx with staph epi from 1/29 (aerobic only) 1 of 2 cx with staph epi from 1/27 (aerobic only) 2/8 blood cx NGTD x2 days Repeat urine cx pending per surgery - enterococcus sensitive to ampicillin, will treat with amoxicillin   Acute bilateral deep vein thrombosis (DVT) of femoral veins (HCC) Currently on eliquis Noted on CT 2/1 abd/pelvis Hb has notably trended down - repeat and transfuse as appropriate  Vitamin B6 deficiency replace  Folate deficiency supplementation  Hypercalcemia Relatively stable recently, follow intermittently IVF PTH 44 -> suggests non parathyroid hypercalcemia follow PTHrp (low), vitamin D 1, 25 (slightly low), normal vitamin D 25 hydroxy Consider additional w/u  Anemia- (present on admission) 1 unit pRBC ordered   Enterococcus UTI S/p treatment Repeat urine cx positive - difficult to tell if symptoms with mental status, will treat as above (amoxicillin x 5 days)  Common bile duct dilatation Noted incidentally on CT scan Bili and alk phos wnl Will defer additional imaging to surgery  Protein-calorie malnutrition, severe Continue nutrition as per surgery  Memory loss According to the chart mild prior to admission, patient was independent and still working at times   DVT prophylaxis: eliquis Code Status: full Family Communication: Of note, attempted to consent [name] for blood. lots of concerns, requests for me to send something saying we'd screened blood.  Apparently she'd had personal complications related to blood  transfusion.  Attempted to discuss risks/benefits with her, but not particularly straightforward discussion.  Of note, during our discussion, the blood that had been ordered earlier was given, I discussed this [name] and she expressed understanding. Disposition:   Status is: Inpatient Remains inpatient appropriate because: no safe discharge plan   Consultants Surgery is primary, [facility] is consulting Palliative care Psych neurology  Procedures:  Status post exploratory laparotomy with right colectomy and ileostomy on 11/17/2021 PEG tube placement 01/24/2022  Antimicrobials:  Anti-infectives (From admission, onward)    Start     Dose/Rate Route Frequency Ordered Stop   02/02/22 1400  amoxicillin (AMOXIL) 250 MG/5ML suspension 500 mg        500 mg Per Tube Every 8 hours 02/02/22 1154 02/07/22 1359   01/01/22 2100  piperacillin-tazobactam (ZOSYN) IVPB 3.375 g  Status:  Discontinued        3.375 g 12.5 mL/hr over 240 Minutes Intravenous Every 8 hours 01/01/22 2042 01/10/22 1333   11/25/21 1000  piperacillin-tazobactam (ZOSYN) IVPB 3.375 g  Status:  Discontinued        3.375 g 12.5 mL/hr over 240 Minutes Intravenous Every 8 hours 11/25/21 0909 12/01/21 0822   11/17/21 1830  piperacillin-tazobactam (ZOSYN) IVPB 3.375 g        3.375 g 12.5 mL/hr over 240 Minutes Intravenous Every 8 hours 11/17/21 1415 11/22/21 2359   11/17/21 1230  piperacillin-tazobactam (ZOSYN) IVPB 3.375 g        3.375 g 100 mL/hr over 30 Minutes Intravenous  Once 11/17/21 1220 11/17/21 1304       Subjective: Says she's not doing well Can't elaborate  Objective: Vitals:   02/03/22 1231 02/03/22 1347 02/03/22 1404 02/03/22 1738  BP:  109/66 124/62 137/71 126/66  Pulse: (!) 102 (!) 103 (!) 104 (!) 102  Resp: '16 20 20 20  ' Temp: 98.3 F (36.8 C) 97.6 F (36.4 C) 98 F (36.7 C) 97.6 F (36.4 C)  TempSrc: Oral Oral Oral Oral  SpO2: 100% 99% 100% 99%  Weight:      Height:        Intake/Output Summary (Last  24 hours) at 02/03/2022 2034 Last data filed at 02/03/2022 1800 Gross per 24 hour  Intake 2984.97 ml  Output 1900 ml  Net 1084.97 ml   Filed Weights   01/27/22 0510 01/31/22 0545 02/03/22 0324  Weight: 75.7 kg 77.6 kg 76.6 kg    Examination:  General: No acute distress. Cardiovascular: RRR Lungs: unlabored Abdomen: midline dressing, ostomy, mucous fistula, peg Neurological: doesn't consistently follow commands, some logical speech, but confused Skin: Warm and dry. No rashes or lesions. Extremities: No clubbing or cyanosis. No edema.   Data Reviewed: I have personally reviewed following labs and imaging studies  CBC: Recent Labs  Lab 01/28/22 0437 01/30/22 0410 02/01/22 0434 02/02/22 0427 02/02/22 1214 02/03/22 0401 02/03/22 1937  WBC 11.4* 11.0* 9.4 10.0  --  9.8  --   NEUTROABS  --  7.8*  --   --   --   --   --   HGB 8.6* 8.4* 7.9* 7.0* 7.8* 6.9* 8.3*  HCT 29.0* 28.3* 26.5* 23.5* 26.8* 23.5* 27.0*  MCV 87.3 85.5 87.2 86.7  --  87.7  --   PLT 324 364 362 372  --  387  --     Basic Metabolic Panel: Recent Labs  Lab 01/28/22 0437 01/29/22 0418 01/30/22 0410 01/31/22 0420 02/01/22 0434 02/02/22 0427 02/03/22 0401  NA 138  --  134*  --  138 138 136  K 4.0   < > 4.3 3.8 3.9 3.7 3.9  CL 107  --  102  --  105 104 104  CO2 28  --  29  --  '29 30 29  ' GLUCOSE 144*  --  139*  --  141* 107* 135*  BUN 8  --  11  --  '17 17 16  ' CREATININE 0.33*  --  0.34*  --  0.32* <0.30* <0.30*  CALCIUM 9.7  --  9.6  --  10.1 10.1 10.1  MG 1.8   < > 1.9 1.9 1.9 2.0 2.0  PHOS 2.0*   < > 3.2 2.4* 2.7 2.7 2.9   < > = values in this interval not displayed.    GFR: CrCl cannot be calculated (This lab value cannot be used to calculate CrCl because it is not Aylin Rhoads number: <0.30).  Liver Function Tests: Recent Labs  Lab 01/28/22 0437 01/30/22 0410 02/01/22 0434 02/02/22 0427  AST 19 19  --   --   ALT 15 14  --   --   ALKPHOS 78 83  --   --   BILITOT 0.1* <0.1*  --   --   PROT 7.7  8.0  --   --   ALBUMIN 1.8* 1.8* 1.9* 1.8*    CBG: Recent Labs  Lab 02/03/22 0404 02/03/22 0712 02/03/22 1533 02/03/22 1707 02/03/22 2007  GLUCAP 117* 131* 128* 121* 115*     Recent Results (from the past 240 hour(s))  Culture, blood (routine x 2)     Status: None   Collection Time: 01/29/22  8:01 PM   Specimen: BLOOD  Result Value Ref Range Status   Specimen  Description   Final    BLOOD BLOOD RIGHT HAND Performed at [facility] [address], [place], [place] [address]    Special Requests   Final    BOTTLES DRAWN AEROBIC ONLY Blood Culture adequate volume Performed at [facility] [address] [place], [place] [address]    Culture   Final    NO GROWTH 5 DAYS Performed at [facility], [place] [address] [place], [place] [address]    Report Status 02/03/2022 FINAL  Final  Culture, blood (routine x 2)     Status: None   Collection Time: 01/29/22  8:01 PM   Specimen: BLOOD  Result Value Ref Range Status   Specimen Description   Final    BLOOD LEFT ANTECUBITAL Performed at [facility] [address] [place], [place] [address]    Special Requests   Final    BOTTLES DRAWN AEROBIC ONLY Blood Culture adequate volume Performed at [facility] [address], [place], [place] [address]    Culture   Final    NO GROWTH 5 DAYS Performed at [facility], [place] [address], [place], [place] [address]    Report Status 02/03/2022 FINAL  Final  Urine Culture     Status: Abnormal   Collection Time: 01/30/22  5:37 PM   Specimen: In/Out Cath Urine  Result Value Ref Range Status   Specimen Description   Final    IN/OUT CATH URINE Performed at [facility] [address] [place], [place] [address]    Special Requests   Final    NONE Performed at [facility], [place] [address] [place], [place] [address]    Culture >=100,000 COLONIES/mL  ENTEROCOCCUS FAECALIS (Debby Clyne)  Final   Report Status 02/02/2022 FINAL  Final   Organism ID, Bacteria ENTEROCOCCUS FAECALIS (Crewe Heathman)  Final      Susceptibility   Enterococcus faecalis - MIC*    AMPICILLIN <=2 SENSITIVE Sensitive     NITROFURANTOIN <=16 SENSITIVE Sensitive     VANCOMYCIN 2 SENSITIVE Sensitive     * >=100,000 COLONIES/mL ENTEROCOCCUS FAECALIS         Radiology Studies: No results found.      Scheduled Meds:  amoxicillin  500 mg Per Tube Q8H   apixaban  5 mg Per Tube BID   chlorhexidine  15 mL Mouth Rinse BID   ferrous sulfate  220 mg Per Tube BID WC   folic acid  1 mg Per Tube Daily   free water  200 mL Per Tube Q6H   insulin aspart  0-9 Units Subcutaneous Q4H   mouth rinse  15 mL Mouth Rinse q12n4p   multivitamin  15 mL Per Tube Daily   niacin  100 mg Per Tube QHS   pantoprazole sodium  40 mg Per Tube Daily   psyllium  1 packet Oral BID   vitamin B-6  100 mg Per Tube Daily   thiamine  100 mg Per Tube BID   Continuous Infusions:  sodium chloride 10 mL/hr at 01/31/22 0616   feeding supplement (OSMOLITE 1.5 CAL) 1,000 mL (02/03/22 0533)     LOS: 78 days    Time spent: over 30 min    Fayrene Helper, MD [facility]   To contact the attending provider between 7A-7P or the covering provider during after hours 7P-7A, please log into the web site [email or web address] and access using universal [place] password for that  web site. If you do not have the password, please call the hospital operator.  02/03/2022, 8:34 PM

## 2022-02-04 ENCOUNTER — Inpatient Hospital Stay (HOSPITAL_COMMUNITY): Payer: Medicare HMO

## 2022-02-04 HISTORY — PX: IR GJ TUBE CHANGE: IMG1440

## 2022-02-04 LAB — ALBUMIN: Albumin: 1.9 g/dL — ABNORMAL LOW (ref 3.5–5.0)

## 2022-02-04 LAB — TYPE AND SCREEN
ABO/RH(D): B POS
Antibody Screen: NEGATIVE
Unit division: 0

## 2022-02-04 LAB — BASIC METABOLIC PANEL
Anion gap: 4 — ABNORMAL LOW (ref 5–15)
BUN: 12 mg/dL (ref 8–23)
CO2: 26 mmol/L (ref 22–32)
Calcium: 9.7 mg/dL (ref 8.9–10.3)
Chloride: 104 mmol/L (ref 98–111)
Creatinine, Ser: <0.3 mg/dL — ABNORMAL LOW (ref 0.44–1.00)
Glucose, Bld: 109 mg/dL — ABNORMAL HIGH (ref 70–99)
Potassium: 3.5 mmol/L (ref 3.5–5.1)
Sodium: 134 mmol/L — ABNORMAL LOW (ref 135–145)

## 2022-02-04 LAB — CBC
HCT: 28.2 % — ABNORMAL LOW (ref 36.0–46.0)
Hemoglobin: 8.5 g/dL — ABNORMAL LOW (ref 12.0–15.0)
MCH: 26.2 pg (ref 26.0–34.0)
MCHC: 30.1 g/dL (ref 30.0–36.0)
MCV: 86.8 fL (ref 80.0–100.0)
Platelets: 365 10*3/uL (ref 150–400)
RBC: 3.25 MIL/uL — ABNORMAL LOW (ref 3.87–5.11)
RDW: 18 % — ABNORMAL HIGH (ref 11.5–15.5)
WBC: 9.5 10*3/uL (ref 4.0–10.5)
nRBC: 0.3 % — ABNORMAL HIGH (ref 0.0–0.2)

## 2022-02-04 LAB — BPAM RBC
Blood Product Expiration Date: 202302272359
ISSUE DATE / TIME: 202302131345
Unit Type and Rh: 7300

## 2022-02-04 LAB — PREALBUMIN: Prealbumin: 12.8 mg/dL — ABNORMAL LOW (ref 18–38)

## 2022-02-04 LAB — GLUCOSE, CAPILLARY
Glucose-Capillary: 101 mg/dL — ABNORMAL HIGH (ref 70–99)
Glucose-Capillary: 103 mg/dL — ABNORMAL HIGH (ref 70–99)
Glucose-Capillary: 117 mg/dL — ABNORMAL HIGH (ref 70–99)
Glucose-Capillary: 126 mg/dL — ABNORMAL HIGH (ref 70–99)
Glucose-Capillary: 140 mg/dL — ABNORMAL HIGH (ref 70–99)
Glucose-Capillary: 89 mg/dL (ref 70–99)

## 2022-02-04 MED ORDER — LIDOCAINE HCL 1 % IJ SOLN
INTRAMUSCULAR | Status: AC
  Filled 2022-02-04: qty 20

## 2022-02-04 MED ORDER — IOHEXOL 300 MG/ML  SOLN
50.0000 mL | Freq: Once | INTRAMUSCULAR | Status: AC | PRN
  Administered 2022-02-04: 10 mL

## 2022-02-04 MED ORDER — LIDOCAINE VISCOUS HCL 2 % MT SOLN
OROMUCOSAL | Status: AC
  Filled 2022-02-04: qty 15

## 2022-02-04 MED ORDER — OSMOLITE 1.5 CAL PO LIQD
120.0000 mL | Freq: Four times a day (QID) | ORAL | Status: DC
  Administered 2022-02-04 – 2022-02-05 (×2): 120 mL
  Filled 2022-02-04 (×4): qty 237

## 2022-02-04 NOTE — Consult Note (Signed)
[facility] Nurse ostomy follow up Patient receiving care in [place]. Stoma type/location: RUQ ileostomy, LUQ mucous fistula Stomal assessment/size: RUQ stoma is pink, moist, budded, 1 1/4 inches round. Peristomal assessment:intact  Treatment options for stomal/peristomal skin: barrier ring Output: thin brown from ileostomy Ostomy pouching: 1pc to mucous fistula  2pc. 2 1/4 inches to ileostomy. Education provided: none; patient unable to learn ostomy care Enrolled patient in [place] Start Discharge program: Yes many weeks ago. Supplies in room. Val Riles, RN, MSN, CWOCN, CNS-BC, pager [phone]

## 2022-02-04 NOTE — TOC Progression Note (Signed)
Transition of Care [facility]) - Progression Note    Patient Details  Name: Marnisha Stampley MRN: [id number] Date of Birth: 09-23-1956  [facility]) CM/SW Contact  Lennart Pall, LCSW Phone Number: 02/04/2022, 11:16 AM  Clinical Narrative:    Have received SNF bed offer from [place]  at [facility].  Have alerted pt's sister, [name], who is aware/ agreeable with anticipated transfer to facility tomorrow (IR to replace g tube this afternoon).  Have, also, updated contact information for [name] in our records.  CCS aware and have ordered COVID test.     Expected Discharge Plan: [place] Barriers to Discharge: Continued Medical Work up, [place], SNF Pending bed offer  Expected Discharge Plan and Services Expected Discharge Plan: [place] In-house Referral: Clinical Social Work     Living arrangements for the past 2 months: Single Family Home                                       Social Determinants of Health (SDOH) Interventions    Readmission Risk Interventions No flowsheet data found.

## 2022-02-04 NOTE — Progress Notes (Signed)
11 Days Post-Op   Subjective/Chief Complaint: Alert and responsive this AM.  Denies pain.   Objective: Vital signs in last 24 hours: Temp:  [97.6 F (36.4 C)-98.3 F (36.8 C)] 98.2 F (36.8 C) (02/14 0500) Pulse Rate:  [97-104] 99 (02/14 0500) Resp:  [16-20] 19 (02/14 0500) BP: (109-137)/(62-73) 128/69 (02/14 0500) SpO2:  [99 %-100 %] 100 % (02/14 0500) Last BM Date : 02/03/22  Intake/Output from previous day: 02/13 0701 - 02/14 0700 In: 1415 [I.V.:540; Blood:315; NG/GT:560] Out: 1250 [Urine:700; Stool:550] Intake/Output this shift: No intake/output data recorded.  General chronically ill appearing, oriented to self but not able to report year or location CV RRR Pulm effort normal ORA Abd soft, ostomy with stool in pouch, MF clean with some mucous in pouch, G-tube has been pulled away from the skin,  when I touched the tube to evaluate it, the bumper immediately fell all the way out. I replaced a 10F foley into the previous G-tube tract.  Lab Results:  Recent Labs    02/03/22 0401 02/03/22 1937 02/04/22 0442  WBC 9.8  --  9.5  HGB 6.9* 8.3* 8.5*  HCT 23.5* 27.0* 28.2*  PLT 387  --  365   BMET Recent Labs    02/03/22 0401 02/04/22 0442  NA 136 134*  K 3.9 3.5  CL 104 104  CO2 29 26  GLUCOSE 135* 109*  BUN 16 12  CREATININE <0.30* <0.30*  CALCIUM 10.1 9.7   PT/INR No results for input(s): LABPROT, INR in the last 72 hours. ABG No results for input(s): PHART, HCO3 in the last 72 hours.  Invalid input(s): PCO2, PO2  Studies/Results: No results found.  Anti-infectives: Anti-infectives (From admission, onward)    Start     Dose/Rate Route Frequency Ordered Stop   02/02/22 1400  amoxicillin (AMOXIL) 250 MG/5ML suspension 500 mg        500 mg Per Tube Every 8 hours 02/02/22 1154 02/07/22 1359   01/01/22 2100  piperacillin-tazobactam (ZOSYN) IVPB 3.375 g  Status:  Discontinued        3.375 g 12.5 mL/hr over 240 Minutes Intravenous Every 8 hours 01/01/22  2042 01/10/22 1333   11/25/21 1000  piperacillin-tazobactam (ZOSYN) IVPB 3.375 g  Status:  Discontinued        3.375 g 12.5 mL/hr over 240 Minutes Intravenous Every 8 hours 11/25/21 0909 12/01/21 0822   11/17/21 1830  piperacillin-tazobactam (ZOSYN) IVPB 3.375 g        3.375 g 12.5 mL/hr over 240 Minutes Intravenous Every 8 hours 11/17/21 1415 11/22/21 2359   11/17/21 1230  piperacillin-tazobactam (ZOSYN) IVPB 3.375 g        3.375 g 100 mL/hr over 30 Minutes Intravenous  Once 11/17/21 1220 11/17/21 1304       Assessment/Plan: 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 Harlow Asa 11/27 - path: necrotic colon at site of perforation, no malignancy or dysplasia noted  - Ileostomy output improved. Continue BID metamucil.  - WOC following for ileostomy, mucous fistula -  Daily WTD dressing  - Pathology benign. CT distal descending/proximal sigmoid bowel luminal collapse. - Dr. Hassell Done stopped her TNA on 1/17 - repeat CT 2/1: resolution in RLQ stranding and resolving hematoma/seroma, bilateral common femoral DVTs - Family consented to PEG placement 2/2 and this was done 2/3. TF at goal.    Bilateral femoral DVTs - noted on repeat CT AP 2/1, on Eliquis; hgb 6.9  yesterday and pt received 1 u pRBC, hgb 8.5 this AM. Appropriate rise.  FEN - Dysphagia 2 diet/but won't eat; PEG placed 2/3 and patient is tolerating TF at goal PEG Tube was dislodged overnight -- when I went into the patients room the tube was hanging away from the skin, when I touched the tube to evaluate it, the bumper immediately fell all the way out. I replaced a 35F foley into the previous G-tube tract. I spoke with IR, Dr. Vernard Gambles, and they will plan to study and replace her G tube later today. I called the patients sister, [name], to notify her of this event and she expressed understanding. I welcomed her questions and answered them.   VTE - SCDs,  eliquis  ID - zosyn 11/27>12/2, 12/5>12/11, 1/11> 1/20; low grade fever 2/6 repeat blood Cx 2/8 (ngtd)  and urine cxs (2/9) , urine cx w/ enterococcus and medicate treated with amoxicillin WBC normal and pt is afebrile.   - encephalopathy/FTT -  On folic acid and thiamine supplementation. Appreciate TRH following as well.   - psych saw 12/27 and determined patient does not have capacity   Dispo: IR to replace G-tube today, CSW working on placement. Patient is medically stable for discharge once G tube repalced and position confirmed.  [name] 02/04/2022

## 2022-02-04 NOTE — Progress Notes (Signed)
PROGRESS NOTE    Sherry Hampton  WJX:[id number] DOB: [birth year] DOA: 11/17/2021 PCP: Pcp, No  No chief complaint on file.   Brief Narrative:  66 year old woman according to chart was independent, living alone and driving and continue to work as needed.  PMH including hyperlipidemia, possibly some memory loss, presented 11/27 with sudden abdominal pain.  Underwent emergent surgery for perforated viscus of the ascending colon with fecal peritonitis same day.  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  Hospitalization has been complicated by fluid collections in the abdomen, malnutrition, s/p TPA (now tube feeds), encephalopathy, and failure to thrive and progressive decline in strength and ability to participate with physical therapy.    See initial consult note from Dr. [name] on 1/7 for additional details.  Completed course of antibiotics for enterococcus UTI and intraabdominal and subcutaneous fluid collections.  She remains deconditioned with abnormal neuro exam and encephalopathy.  Status post PEG tube placement.  Noted to have bilateral femoral DVTs on anticoagulation with Eliquis.    Transfused for downtrending hemoglobin on 2/13.  Currently receiving course of abx for UTI.  Has had persistently elevated calcium, workup pending.    Discharge pending per surgery.     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 Active Problems:   Perforated abdominal viscus   Goals of care, counseling/discussion   Acute metabolic encephalopathy   Abnormal neurological exam   Acute bilateral deep vein thrombosis (DVT) of femoral veins (HCC)   Fever   S/P percutaneous endoscopic gastrostomy (PEG) tube placement (HCC)   Anemia   Hypercalcemia   Folate deficiency   Vitamin B6 deficiency   Protein-calorie malnutrition, severe   Common  bile duct dilatation   Enterococcus UTI   Memory loss   Delirium   GERD (gastroesophageal reflux disease)   Hyperlipidemia   Hyponatremia   Ileostomy in place [facility])   Fecal peritonitis ([place])   Hypophosphatemia   Assessment and Plan: Perforated abdominal viscus- (present on admission) --per surgery S/p ex lap, extended right colectomy, end ileostomy, mucous fistula 11/17/2021 CT 2/1 with near complete resolution of heterogenous fat stranding and fluid in low right hemiabdomen with residual tubular and bandlike elements in vicinity (c/w resolving post op hematoma/seroma with fat necrosis)  Abnormal neurological exam Exam difficult, inconsistent MRI c spine recommended 12/21, MRI c spine with C6-7 severe bilateral neural foraminal narrowing, C5-6 severe L and mild R enural foraminal narrowing, C3-4 and C4-5 mild to moderate L neural foraminal narrowing MRI brain without acute abnormality MRI T and L spine done as well without findings that I think would explain her weakness, reviewed with neuro Copper wnl, vitamin E (wnl), vitamin b1 (elevated), and vitamin b6 (low).  Vitamin D wnl. Continue folate and niacin supplementation per neuro EEG with generalized slowing EMG/NCS will need to be outpatient Discuss with neuro again as needed   Acute metabolic encephalopathy- (present on admission) alert, but obviously confused - stable, not changing meaningfully See 12/28/2021 initial consult note [name]) for review of the chart and extensive work-up. Previously with normal ammonia, TSH, HIV S/p high dose thiamine, continue supplemenation MRI brain 12/21 without acute abnormality Repeat MRI brain 1/15 without acute findings EEG generalized slowing c/w encephalopathy MRI C spine with C6-7 severe bilateral neural foraminal narrowing, C5-6 severe L and mild R neural foraminal narrowing, C3-4 and C4-5 mild to moderate L neural foraminal  narrowing Neurology evaluated and delirium thought 2/2  prolonged hospitalization, sleep wake disturbance, nutritional def, critical illness Continued encephalopathy multifactorial, related to infection (above - now on abx), prolonged hospitalization, nutritional def, critical illness --- follow with antibiotics, nutrition, etc B6 and folic acid supplementation Delirium precautions We haven't checked cortisol yet, will follow this tmrw AM    S/P percutaneous endoscopic gastrostomy (PEG) tube placement ([place]) 2/3 upper endoscopy with PEG See ethics note from 1/31 by Dr. Doyle Askew tube came out 2/14, IR to replace 2/14, pending  Fever Temp 100 on 2/6 1 of 2 cx with staph epi from 1/29 (aerobic only) 1 of 2 cx with staph epi from 1/27 (aerobic only) 2/8 blood cx NGTD x2 days Repeat urine cx pending per surgery - enterococcus sensitive to ampicillin, will treat with amoxicillin x5 days   Acute bilateral deep vein thrombosis (DVT) of femoral veins (HCC) Currently on eliquis Noted on CT 2/1 abd/pelvis   Vitamin B6 deficiency replace  Folate deficiency supplementation  Hypercalcemia Elevated when corrected for albumin (11.4 tpday).  Relatively stable recently, follow intermittently. PTH 44 -> suggests non parathyroid hypercalcemia follow PTHrp (low), vitamin D 1, 25 (slightly low), normal vitamin D 25 hydroxy Related to immobility?  Follow SPEP. Consider additional w/u   Anemia- (present on admission) S/p 1 unit pRBC Hb appropriate and stable after transfusion   Enterococcus UTI S/p treatment Repeat urine cx positive - difficult to tell if symptoms with mental status, will treat as above (amoxicillin x 5 days)  Common bile duct dilatation Noted incidentally on CT scan Bili and alk phos wnl Will defer additional imaging to surgery  Protein-calorie malnutrition, severe Continue nutrition as per surgery  Memory loss According to the chart mild prior to admission, patient was independent and still working at times   DVT  prophylaxis: eliquis Code Status: full Family Communication: none Disposition:   Status is: Inpatient Remains inpatient appropriate because: no safe discharge plan   Consultants Surgery is primary, [facility] is consulting Palliative care Psych neurology  Procedures:  Status post exploratory laparotomy with right colectomy and ileostomy on 11/17/2021 PEG tube placement 01/24/2022  Antimicrobials:  Anti-infectives (From admission, onward)    Start     Dose/Rate Route Frequency Ordered Stop   02/02/22 1400  amoxicillin (AMOXIL) 250 MG/5ML suspension 500 mg        500 mg Per Tube Every 8 hours 02/02/22 1154 02/07/22 1359   01/01/22 2100  piperacillin-tazobactam (ZOSYN) IVPB 3.375 g  Status:  Discontinued        3.375 g 12.5 mL/hr over 240 Minutes Intravenous Every 8 hours 01/01/22 2042 01/10/22 1333   11/25/21 1000  piperacillin-tazobactam (ZOSYN) IVPB 3.375 g  Status:  Discontinued        3.375 g 12.5 mL/hr over 240 Minutes Intravenous Every 8 hours 11/25/21 0909 12/01/21 0822   11/17/21 1830  piperacillin-tazobactam (ZOSYN) IVPB 3.375 g        3.375 g 12.5 mL/hr over 240 Minutes Intravenous Every 8 hours 11/17/21 1415 11/22/21 2359   11/17/21 1230  piperacillin-tazobactam (ZOSYN) IVPB 3.375 g        3.375 g 100 mL/hr over 30 Minutes Intravenous  Once 11/17/21 1220 11/17/21 1304       Subjective: No complaints today  Objective: Vitals:   02/03/22 1738 02/03/22 2209 02/04/22 0500 02/04/22 1340  BP: 126/66 131/73 128/69 118/69  Pulse: (!) 102 97 99 100  Resp: '20 17 19 14  ' Temp: 97.6 F (36.4 C) 98.3  F (36.8 C) 98.2 F (36.8 C) 98.9 F (37.2 C)  TempSrc: Oral  Oral Oral  SpO2: 99% 100% 100% 99%  Weight:      Height:        Intake/Output Summary (Last 24 hours) at 02/04/2022 1522 Last data filed at 02/04/2022 1400 Gross per 24 hour  Intake 315 ml  Output 1550 ml  Net -1235 ml   Filed Weights   01/27/22 0510 01/31/22 0545 02/03/22 0324  Weight: 75.7 kg 77.6 kg  76.6 kg    Examination:  General: No acute distress. Cardiovascular: RRR Lungs: unlabored Abdomen: midline dressing, ostomy, mucous fistula, peg Neurological: no complaints, stable exam, moving arms spontaneously, not following commands consistently  Extremities: legs in prafo boots  Data Reviewed: I have personally reviewed following labs and imaging studies  CBC: Recent Labs  Lab 01/30/22 0410 02/01/22 0434 02/02/22 0427 02/02/22 1214 02/03/22 0401 02/03/22 1937 02/04/22 0442  WBC 11.0* 9.4 10.0  --  9.8  --  9.5  NEUTROABS 7.8*  --   --   --   --   --   --   HGB 8.4* 7.9* 7.0* 7.8* 6.9* 8.3* 8.5*  HCT 28.3* 26.5* 23.5* 26.8* 23.5* 27.0* 28.2*  MCV 85.5 87.2 86.7  --  87.7  --  86.8  PLT 364 362 372  --  387  --  503    Basic Metabolic Panel: Recent Labs  Lab 01/30/22 0410 01/31/22 0420 02/01/22 0434 02/02/22 0427 02/03/22 0401 02/04/22 0442  NA 134*  --  138 138 136 134*  K 4.3 3.8 3.9 3.7 3.9 3.5  CL 102  --  105 104 104 104  CO2 29  --  '29 30 29 26  ' GLUCOSE 139*  --  141* 107* 135* 109*  BUN 11  --  '17 17 16 12  ' CREATININE 0.34*  --  0.32* <0.30* <0.30* <0.30*  CALCIUM 9.6  --  10.1 10.1 10.1 9.7  MG 1.9 1.9 1.9 2.0 2.0  --   PHOS 3.2 2.4* 2.7 2.7 2.9  --     GFR: CrCl cannot be calculated (This lab value cannot be used to calculate CrCl because it is not [place] Bontempo number: <0.30).  Liver Function Tests: Recent Labs  Lab 01/30/22 0410 02/01/22 0434 02/02/22 0427 02/04/22 1417  AST 19  --   --   --   ALT 14  --   --   --   ALKPHOS 83  --   --   --   BILITOT <0.1*  --   --   --   PROT 8.0  --   --   --   ALBUMIN 1.8* 1.9* 1.8* 1.9*    CBG: Recent Labs  Lab 02/03/22 2007 02/03/22 2354 02/04/22 0322 02/04/22 0744 02/04/22 1118  GLUCAP 115* 139* 140* 126* 103*     Recent Results (from the past 240 hour(s))  Culture, blood (routine x 2)     Status: None   Collection Time: 01/29/22  8:01 PM   Specimen: BLOOD  Result Value Ref Range Status    Specimen Description   Final    BLOOD BLOOD RIGHT HAND Performed at [facility], [place] [address] [place], [place] [address]    Special Requests   Final    BOTTLES DRAWN AEROBIC ONLY Blood Culture adequate volume Performed at [facility] [address] [place], [place] [address]    Culture   Final    NO  GROWTH 5 DAYS Performed at [facility], [place] [address] [place], [place] [address]    Report Status 02/03/2022 FINAL  Final  Culture, blood (routine x 2)     Status: None   Collection Time: 01/29/22  8:01 PM   Specimen: BLOOD  Result Value Ref Range Status   Specimen Description   Final    BLOOD LEFT ANTECUBITAL Performed at [place] [address], [place], [place] [address]    Special Requests   Final    BOTTLES DRAWN AEROBIC ONLY Blood Culture adequate volume Performed at [facility] [address] [place], [place] [address]    Culture   Final    NO GROWTH 5 DAYS Performed at [facility], [place] [address] [place], [place] [address]    Report Status 02/03/2022 FINAL  Final  Urine Culture     Status: Abnormal   Collection Time: 01/30/22  5:37 PM   Specimen: In/Out Cath Urine  Result Value Ref Range Status   Specimen Description   Final    IN/OUT CATH URINE Performed at [facility] [address], [place], [place] [address]    Special Requests   Final    NONE Performed at [facility], [facility], [place] [address], [place], [place] [address]    Culture >=100,000 COLONIES/mL ENTEROCOCCUS FAECALIS (Shamicka Inga)  Final   Report Status 02/02/2022 FINAL  Final   Organism ID, Bacteria ENTEROCOCCUS FAECALIS (Satish Hammers)  Final      Susceptibility   Enterococcus faecalis - MIC*    AMPICILLIN <=2 SENSITIVE Sensitive     NITROFURANTOIN <=16 SENSITIVE Sensitive     VANCOMYCIN 2 SENSITIVE Sensitive     * >=100,000 COLONIES/mL ENTEROCOCCUS FAECALIS         Radiology  Studies: No results found.      Scheduled Meds:  amoxicillin  500 mg Per Tube Q8H   apixaban  5 mg Per Tube BID   chlorhexidine  15 mL Mouth Rinse BID   feeding supplement (OSMOLITE 1.5 CAL)  120 mL Per Tube QID   ferrous sulfate  220 mg Per Tube BID WC   folic acid  1 mg Per Tube Daily   free water  200 mL Per Tube Q6H   insulin aspart  0-9 Units Subcutaneous Q4H   mouth rinse  15 mL Mouth Rinse q12n4p   multivitamin  15 mL Per Tube Daily   niacin  100 mg Per Tube QHS   pantoprazole sodium  40 mg Per Tube Daily   psyllium  1 packet Oral BID   vitamin B-6  100 mg Per Tube Daily   thiamine  100 mg Per Tube BID   Continuous Infusions:  sodium chloride 10 mL/hr at 01/31/22 0616     LOS: 79 days    Time spent: over 30 min    Fayrene Helper, MD [facility]   To contact the attending provider between 7A-7P or the covering provider during after hours 7P-7A, please log into the web site [email or web address] and access using universal [place] password for that web site. If you do not have the password, please call the hospital operator.  02/04/2022, 3:22 PM

## 2022-02-04 NOTE — Progress Notes (Signed)
Nutrition Follow-up  DOCUMENTATION CODES:   Severe malnutrition in context of acute illness/injury  INTERVENTION:   Bolus recommendations: -Initiate with 120 ml Osmolite 1.5 QID -to aid in volume tolerance  -Advance as tolerated to goal of 237 ml Osmolite 1.5 six times daily or 355 ml Osmolite 1.5 QID via PEG (if can eventually tolerate larger volumes). -Provides 2130 kcals, 89g protein and 1086 ml H2O  -Recommend free water flushes of 60 ml before and after each bolus (480 ml)  -Continue liquid MVI daily  NUTRITION DIAGNOSIS:   Severe Malnutrition related to acute illness as evidenced by energy intake < or equal to 50% for > or equal to 5 days, moderate fat depletion, severe muscle depletion.  Ongoing.  GOAL:   Patient will meet greater than or equal to 90% of their needs  Not meeting currently, PEG needs to be replaced  MONITOR:   PO intake, Supplement acceptance, Labs, Weight trends, I & O's (TPN)  REASON FOR ASSESSMENT:   Consult Enteral/tube feeding initiation and management -Bolus feeds  ASSESSMENT:   66 year old female who presents to the emergency department with sudden onset of abdominal pain and distention.CT scan of abdomen and pelvis shows findings consistent with free air with a large pneumoperitoneum.  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  11/27: s/p ex lap, right colectomy, ileostomy 12/11: PICC placed 12/12: TPN initiated 1/17: TPN discontinued 1/26: PICC removed 2/3: s/p UGI endoscopy with PEG placement   Patient was receiving continuous feeds of Osmolite 1.5 @ 60 ml/hr via PEG.  TF now off as G-tube came out this morning.  IR to replace today.   Surgery now requesting transition to bolus feeds. Will leave recommendations above once tube is replaced.  Admission weight: 176 lbs Current weight: 168 lbs  Medications: Ferrous sulfate, Folic acid, Liquid MVI, Niacin, Metamucil, Vitamin B-6, Thiamine  Labs  reviewed:  CBGs: 103-140 Low Na  Diet Order:   Diet Order             Diet NPO time specified  Diet effective now                   EDUCATION NEEDS:   Education needs have been addressed  Skin:  Skin Assessment: Skin Integrity Issues: Skin Integrity Issues:: Incisions, Other (Comment) Incisions: 11/27 abdomen Other: left thigh pressure injury  Last BM:  2/14 -ileostomy  Height:   Ht Readings from Last 1 Encounters:  01/24/22 5\' 10"  (1.778 m)    Weight:   Wt Readings from Last 1 Encounters:  02/03/22 76.6 kg    BMI:  Body mass index is 24.23 kg/m.  Estimated Nutritional Needs:   Kcal:  1950-2150  Protein:  95-105g  Fluid:  2L/day  Clayton Bibles, MS, RD, LDN Inpatient Clinical Dietitian Contact information available via Amion

## 2022-02-05 LAB — CBC
HCT: 29 % — ABNORMAL LOW (ref 36.0–46.0)
Hemoglobin: 8.9 g/dL — ABNORMAL LOW (ref 12.0–15.0)
MCH: 26.6 pg (ref 26.0–34.0)
MCHC: 30.7 g/dL (ref 30.0–36.0)
MCV: 86.8 fL (ref 80.0–100.0)
Platelets: 407 10*3/uL — ABNORMAL HIGH (ref 150–400)
RBC: 3.34 MIL/uL — ABNORMAL LOW (ref 3.87–5.11)
RDW: 18.7 % — ABNORMAL HIGH (ref 11.5–15.5)
WBC: 8.6 10*3/uL (ref 4.0–10.5)
nRBC: 0 % (ref 0.0–0.2)

## 2022-02-05 LAB — RETICULOCYTES
Immature Retic Fract: 35.6 % — ABNORMAL HIGH (ref 2.3–15.9)
RBC.: 3.33 MIL/uL — ABNORMAL LOW (ref 3.87–5.11)
Retic Count, Absolute: 105.6 10*3/uL (ref 19.0–186.0)
Retic Ct Pct: 3.2 % — ABNORMAL HIGH (ref 0.4–3.1)

## 2022-02-05 LAB — BASIC METABOLIC PANEL
Anion gap: 5 (ref 5–15)
BUN: 12 mg/dL (ref 8–23)
CO2: 27 mmol/L (ref 22–32)
Calcium: 10.5 mg/dL — ABNORMAL HIGH (ref 8.9–10.3)
Chloride: 105 mmol/L (ref 98–111)
Creatinine, Ser: <0.3 mg/dL — ABNORMAL LOW (ref 0.44–1.00)
Glucose, Bld: 97 mg/dL (ref 70–99)
Potassium: 3.6 mmol/L (ref 3.5–5.1)
Sodium: 137 mmol/L (ref 135–145)

## 2022-02-05 LAB — GLUCOSE, CAPILLARY
Glucose-Capillary: 95 mg/dL (ref 70–99)
Glucose-Capillary: 107 mg/dL — ABNORMAL HIGH (ref 70–99)
Glucose-Capillary: 124 mg/dL — ABNORMAL HIGH (ref 70–99)

## 2022-02-05 LAB — IRON AND TIBC
Iron: 17 ug/dL — ABNORMAL LOW (ref 28–170)
Saturation Ratios: 8 % — ABNORMAL LOW (ref 10.4–31.8)
TIBC: 214 ug/dL — ABNORMAL LOW (ref 250–450)
UIBC: 197 ug/dL

## 2022-02-05 LAB — VITAMIN B12: Vitamin B-12: 482 pg/mL (ref 180–914)

## 2022-02-05 LAB — CORTISOL: Cortisol, Plasma: 13.5 ug/dL

## 2022-02-05 LAB — FERRITIN: Ferritin: 113 ng/mL (ref 11–307)

## 2022-02-05 LAB — FOLATE: Folate: 32.1 ng/mL (ref >5.9)

## 2022-02-05 MED ORDER — OSMOLITE 1.5 CAL PO LIQD: 120.0000 mL | Freq: Four times a day (QID) | ORAL | 0 refills | Status: AC

## 2022-02-05 MED ORDER — LIP MEDEX EX OINT: 1.0000 "application " | TOPICAL_OINTMENT | CUTANEOUS | 0 refills | Status: AC | PRN

## 2022-02-05 MED ORDER — PSYLLIUM 95 % PO PACK: 1.0000 | PACK | Freq: Two times a day (BID) | ORAL | Status: AC

## 2022-02-05 MED ORDER — THIAMINE HCL 100 MG PO TABS: 100.0000 mg | ORAL_TABLET | Freq: Two times a day (BID) | ORAL | Status: AC

## 2022-02-05 MED ORDER — ORAL CARE MOUTH RINSE: 15.0000 mL | Freq: Two times a day (BID) | OROMUCOSAL | 0 refills | Status: AC

## 2022-02-05 MED ORDER — ROSUVASTATIN CALCIUM 10 MG PO TABS: 10.0000 mg | ORAL_TABLET | Freq: Every day | ORAL | Status: AC

## 2022-02-05 MED ORDER — PANTOPRAZOLE SODIUM 40 MG PO PACK: 40.0000 mg | PACK | Freq: Every day | ORAL | Status: AC

## 2022-02-05 MED ORDER — APIXABAN 5 MG PO TABS: 5.0000 mg | ORAL_TABLET | Freq: Two times a day (BID) | ORAL | Status: AC

## 2022-02-05 MED ORDER — ADULT MULTIVITAMIN LIQUID CH: 15.0000 mL | Freq: Every day | ORAL | Status: AC

## 2022-02-05 MED ORDER — AMOXICILLIN 250 MG/5ML PO SUSR: 500.0000 mg | Freq: Three times a day (TID) | ORAL | 0 refills | Status: AC

## 2022-02-05 MED ORDER — ACETAMINOPHEN 160 MG/5ML PO SOLN: 500.0000 mg | Freq: Four times a day (QID) | ORAL | 0 refills | Status: AC | PRN

## 2022-02-05 MED ORDER — PYRIDOXINE HCL 100 MG PO TABS: 100.0000 mg | ORAL_TABLET | Freq: Every day | ORAL | Status: AC

## 2022-02-05 MED ORDER — FERROUS SULFATE 300 (60 FE) MG/5ML PO SYRP: 220.0000 mg | ORAL_SOLUTION | Freq: Two times a day (BID) | ORAL | 3 refills | Status: AC

## 2022-02-05 MED ORDER — CHLORHEXIDINE GLUCONATE 0.12 % MT SOLN: 15.0000 mL | Freq: Two times a day (BID) | OROMUCOSAL | 0 refills | Status: AC

## 2022-02-05 MED ORDER — FREE WATER: 200.0000 mL | Freq: Four times a day (QID) | Status: AC

## 2022-02-05 MED ORDER — FOLIC ACID 1 MG PO TABS: 1.0000 mg | ORAL_TABLET | Freq: Every day | ORAL | Status: AC

## 2022-02-05 MED ORDER — NIACIN 100 MG PO TABS
100.0000 mg | ORAL_TABLET | Freq: Every day | ORAL | Status: AC
Start: 2022-02-05 — End: ?

## 2022-02-05 MED ORDER — INSULIN ASPART 100 UNIT/ML IJ SOLN
0.0000 [IU] | INTRAMUSCULAR | 11 refills | Status: AC
Start: 2022-02-05 — End: ?

## 2022-02-05 NOTE — Plan of Care (Signed)
Discharge instructions and report were given to facility. All questions were answered. Patient was transported to facility by [facility]. ? ?

## 2022-02-05 NOTE — TOC Transition Note (Signed)
Transition of Care [facility]) - CM/SW Discharge Note   Patient Details  Name: Sherry Hampton MRN: [id number] Date of Birth: 1956-02-13  Transition of Care [facility]) CM/SW Contact:  Lennart Pall, LCSW Phone Number: 02/05/2022, 12:22 PM   Clinical Narrative:    Pt has SNF bed ready today at [facility] and family has accepted.  Medically cleared for dc.  [facility] called.  DC packet left for nursing.  No further TOC needs.   Final next level of care: Skilled Nursing Facility Barriers to Discharge: Barriers Resolved   Patient Goals and CMS Choice Patient states their goals for this hospitalization and ongoing recovery are:: Pt states her goal is to return home but relucatantly considering SNF rehab prior to home      Discharge Placement              Patient chooses bed at: [facility] Patient to be transferred to facility by: [facility] Name of family member notified: sister, [name] Patient and family notified of of transfer: 02/05/22  Discharge Plan and Services In-house Referral: Clinical Social Work              DME Arranged: N/A DME Agency: NA                  Social Determinants of Health ([place]) Interventions     Readmission Risk Interventions No flowsheet data found.

## 2022-02-05 NOTE — Discharge Instructions (Signed)
MIDLINE WOUND CARE: - midline dressing to be changed twice daily - supplies: saline, gauze, scissors, ABD pads, tape  - remove dressing and all packing carefully, clean edges of skin around the wound with water/gauze, making sure there is no tape debris or leakage left on skin that could cause skin irritation or breakdown. - dampen a clean gauze with saline and pack wound from wound base to skin level, making sure to take note of any possible areas of wound tracking, tunneling and packing appropriately. Wound can be packed loosely. - cover wound with a dry ABD pad or gauze and secure with tape.  - apply any skin protectant/powder recommended by clinician to protect skin/skin folds. - change dressing as needed if leakage occurs, wound gets contaminated, or patient requests to shower. - patient may shower daily with wound open and following the shower the wound should be dried and a clean dressing placed.    [facility], [place] [phone]  OPEN ABDOMINAL SURGERY: POST OP INSTRUCTIONS  Always review your discharge instruction sheet given to you by the facility where your surgery was performed.  IF YOU HAVE DISABILITY OR FAMILY LEAVE FORMS, YOU MUST BRING THEM TO THE OFFICE FOR PROCESSING.  PLEASE DO NOT GIVE THEM TO YOUR DOCTOR.  A prescription for pain medication may be given to you upon discharge.  Take your pain medication as prescribed, if needed.  If narcotic pain medicine is not needed, then you may take acetaminophen (Tylenol) or ibuprofen (Advil) as needed. Take your usually prescribed medications unless otherwise directed. If you need a refill on your pain medication, please contact your pharmacy. They will contact our office to request authorization.  Prescriptions will not be filled after 5pm or on week-ends. You should follow a light diet the first few days after arrival home, such as soup and crackers, pudding, etc.unless your doctor has advised otherwise. A  high-fiber, low fat diet can be resumed as tolerated.   Be sure to include lots of fluids daily. Most patients will experience some swelling and bruising on the chest and neck area.  Ice packs will help.  Swelling and bruising can take several days to resolve Most patients will experience some swelling and bruising in the area of the incision. Ice pack will help. Swelling and bruising can take several days to resolve..  It is common to experience some constipation if taking pain medication after surgery.  Increasing fluid intake and taking a stool softener will usually help or prevent this problem from occurring.  A mild laxative (Milk of Magnesia or Miralax) should be taken according to package directions if there are no bowel movements after 48 hours.  You may have steri-strips (small skin tapes) in place directly over the incision.  These strips should be left on the skin for 7-10 days.  If your surgeon used skin glue on the incision, you may shower in 24 hours.  The glue will flake off over the next 2-3 weeks.  Any sutures or staples will be removed at the office during your follow-up visit. You may find that a light gauze bandage over your incision may keep your staples from being rubbed or pulled. You may shower and replace the bandage daily. ACTIVITIES:  You may resume regular (light) daily activities beginning the next day--such as daily self-care, walking, climbing stairs--gradually increasing activities as tolerated.  You may have sexual intercourse when it is comfortable.  Refrain from any heavy lifting or straining  until approved by your doctor. You may drive when you no longer are taking prescription pain medication, you can comfortably wear a seatbelt, and you can safely maneuver your car and apply brakes Return to Work: ___________________________________ Dennis Bast should see your doctor in the office for a follow-up appointment approximately two weeks after your surgery.  Make sure that you call for  this appointment within a day or two after you arrive home to insure a convenient appointment time. OTHER INSTRUCTIONS:  _____________________________________________________________ _____________________________________________________________  WHEN TO CALL YOUR DOCTOR: Fever over 101.0 Inability to urinate Nausea and/or vomiting Extreme swelling or bruising Continued bleeding from incision. Increased pain, redness, or drainage from the incision. Difficulty swallowing or breathing Muscle cramping or spasms. Numbness or tingling in hands or feet or around lips.  The clinic staff is available to answer your questions during regular business hours.  Please dont hesitate to call and ask to speak to one of the nurses if you have concerns.  For further questions, please visit [email or web address]

## 2022-02-05 NOTE — Progress Notes (Signed)
Note--patient NOT seen prior to being d/c this am by Gen surg  To do Patient needs dedicated ultrasound abdomen pelvis lower extremities in about 2 weeks to determine if can come off of Eliquis Needs OP Gen surg follow-up Needs Hospice input as OP if fails to thive   66 year old woman according to chart was independent, living alone and driving and continue to work as needed.  PMH including hyperlipidemia, possibly some memory loss, presented 11/27 with sudden abdominal pain.   Pertinent events 11/17/2021 emergent surgery for perforated viscus of the ascending colon with fecal peritonitis same day.  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   01/20/2022-ethics consult Acute DVT 01/22/2022 based on CT abdomen pelvis PEG tube placement 01/24/2022 PEG tube replacement 2/15? Transfusion 2/131 unit PRBC  Hospitalization complicated by fluid collections in the abdomen, malnutrition, s/p TPA (now tube feeds), encephalopathy, and failure to thrive and progressive decline in strength and ability to participate with physical therapy.    See initial consult note from Dr. [name] on 1/7 for additional details.  Completed course of antibiotics for enterococcus UTI and intraabdominal and subcutaneous fluid collections.   remains with abnormal neuro exam and encephalopathy.   Status post PEG tube placement.   Noted to have bilateral femoral DVTs on anticoagulation with Eliquis.    Transfused for downtrending hemoglobin on 2/13.  Currently receiving course of abx for UTI.  persistently elevated calcium, workup pending.    Discharge pending per surgery.  Realtively stabilized    WOC Nurse ostomy follow up 2/14 : RUQ ileostomy, LUQ mucous fistula Stomal assessment/size: RUQ stoma is pink, moist, budded, 1 1/4 inches round. Peristomal assessment:intact  Treatment options for stomal/peristomal skin: barrier ring Output:  thin brown from ileostomy Ostomy pouching: 1pc to mucous fistula  2pc. 2 1/4 inches to ileostomy.  CT abdomen pelvis 2/1  Incidental note of expansile, hypodense thrombus within the bilateral common femoral veins, with fat stranding about the vessels. 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 5. Nonobstructive left nephrolithiasis.

## 2022-02-06 LAB — PROTEIN ELECTROPHORESIS, SERUM
A/G Ratio: 0.4 — ABNORMAL LOW (ref 0.7–1.7)
Albumin ELP: 1.9 g/dL — ABNORMAL LOW (ref 2.9–4.4)
Alpha-1-Globulin: 0.4 g/dL (ref 0.0–0.4)
Alpha-2-Globulin: 0.8 g/dL (ref 0.4–1.0)
Beta Globulin: 0.9 g/dL (ref 0.7–1.3)
Gamma Globulin: 3.2 g/dL — ABNORMAL HIGH (ref 0.4–1.8)
Globulin, Total: 5.3 g/dL — ABNORMAL HIGH (ref 2.2–3.9)
Total Protein ELP: 7.2 g/dL (ref 6.0–8.5)

## 2022-02-11 ENCOUNTER — Emergency Department: Admit: 2022-02-11 | Payer: PRIVATE HEALTH INSURANCE | Attending: Diagnostic Radiology | Primary: Internal Medicine

## 2022-02-11 ENCOUNTER — Inpatient Hospital Stay: Admit: 2022-02-11 | Payer: PRIVATE HEALTH INSURANCE

## 2022-02-11 ENCOUNTER — Inpatient Hospital Stay
Admit: 2022-02-11 | Discharge: 2022-08-14 | Payer: PRIVATE HEALTH INSURANCE | Attending: Nephrology | Admitting: Hospitalist

## 2022-02-11 ENCOUNTER — Emergency Department: Admit: 2022-02-11 | Payer: PRIVATE HEALTH INSURANCE | Primary: Internal Medicine

## 2022-02-11 DIAGNOSIS — R627 Adult failure to thrive: Secondary | ICD-10-CM

## 2022-02-11 LAB — BASIC METABOLIC PANEL
BKR ANION GAP: 7 (ref 7–17)
BKR BLOOD UREA NITROGEN: 17 mg/dL (ref 8–23)
BKR BLOOD UREA NITROGEN: 16 mg/dL (ref 8–23)
BKR BLOOD UREA NITROGEN: 16 mg/dL (ref 8–23)
BKR BUN / CREAT RATIO: 48.6 — ABNORMAL HIGH (ref 8.0–23.0)
BKR BUN / CREAT RATIO: 53.3 — ABNORMAL HIGH (ref 8.0–23.0)
BKR BUN / CREAT RATIO: 38.1 — ABNORMAL HIGH (ref 8.0–23.0)
BKR CALCIUM: 12 mg/dL — ABNORMAL HIGH (ref 8.8–10.2)
BKR CALCIUM: 12 mg/dL — ABNORMAL HIGH (ref 8.8–10.2)
BKR CALCIUM: 12.1 mg/dL — ABNORMAL HIGH (ref 8.8–10.2)
BKR CHLORIDE: 109 mmol/L — ABNORMAL HIGH (ref 98–107)
BKR CHLORIDE: 109 mmol/L — ABNORMAL HIGH (ref 98–107)
BKR CHLORIDE: 110 mmol/L — ABNORMAL HIGH (ref 98–107)
BKR CO2: 23 mmol/L (ref 20–30)
BKR CO2: 28 mmol/L (ref 20–30)
BKR CREATININE: 0.35 mg/dL — ABNORMAL LOW (ref 0.40–1.30)
BKR CREATININE: 0.3 mg/dL — ABNORMAL LOW (ref 0.40–1.30)
BKR CREATININE: 0.42 mg/dL (ref 0.40–1.30)
BKR EGFR, CREATININE (CKD-EPI 2021): >60 mL/min/{1.73_m2} (ref >=60)
BKR EGFR, CREATININE (CKD-EPI 2021): >60 mL/min/{1.73_m2} (ref >=60)
BKR EGFR, CREATININE (CKD-EPI 2021): >60 mL/min/{1.73_m2} (ref >=60)
BKR GLUCOSE: 138 mg/dL — ABNORMAL HIGH (ref 70–100)
BKR GLUCOSE: 123 mg/dL — ABNORMAL HIGH (ref 70–100)
BKR GLUCOSE: 127 mg/dL — ABNORMAL HIGH (ref 70–100)
BKR POTASSIUM: 4 mmol/L (ref 3.3–5.3)
BKR SODIUM: 145 mmol/L — ABNORMAL HIGH (ref 136–144)

## 2022-02-11 LAB — CBC WITH AUTO DIFFERENTIAL
BKR WAM ABSOLUTE IMMATURE GRANULOCYTES.: 0.02 x 1000/ÂµL (ref 0.00–0.30)
BKR WAM ABSOLUTE LYMPHOCYTE COUNT.: 1.41 x 1000/ÂµL (ref 0.60–3.70)
BKR WAM ABSOLUTE NRBC (2 DEC): 0 x 1000/ÂµL (ref 0.00–1.00)
BKR WAM ANALYZER ANC: 6.55 x 1000/ÂµL (ref 2.00–7.60)
BKR WAM BASOPHIL ABSOLUTE COUNT.: 0.04 x 1000/ÂµL (ref 0.00–1.00)
BKR WAM BASOPHILS: 0.5 % (ref 0.0–1.4)
BKR WAM EOSINOPHIL ABSOLUTE COUNT.: 0.08 x 1000/ÂµL (ref 0.00–1.00)
BKR WAM EOSINOPHILS: 0.9 % (ref 0.0–5.0)
BKR WAM HEMATOCRIT (2 DEC): 40.5 % (ref 35.00–45.00)
BKR WAM HEMOGLOBIN: 11.9 g/dL (ref 11.7–15.5)
BKR WAM IMMATURE GRANULOCYTES: 0.2 % (ref 0.0–1.0)
BKR WAM LYMPHOCYTES: 16 % — ABNORMAL LOW (ref 17.0–50.0)
BKR WAM MCH (PG): 26 pg — ABNORMAL LOW (ref 27.0–33.0)
BKR WAM MCHC: 29.4 g/dL — ABNORMAL LOW (ref 31.0–36.0)
BKR WAM MCV: 88.4 fL (ref 80.0–100.0)
BKR WAM MONOCYTE ABSOLUTE COUNT.: 0.73 x 1000/ÂµL (ref 0.00–1.00)
BKR WAM MONOCYTES: 8.3 % (ref 4.0–12.0)
BKR WAM MPV: 10 fL (ref 8.0–12.0)
BKR WAM NEUTROPHILS: 74.1 % — ABNORMAL HIGH (ref 39.0–72.0)
BKR WAM NUCLEATED RED BLOOD CELLS: 0 % (ref 0.0–1.0)
BKR WAM PLATELETS: 561 x1000/ÂµL — ABNORMAL HIGH (ref 150–420)
BKR WAM RDW-CV: 19.8 % — ABNORMAL HIGH (ref 11.0–15.0)
BKR WAM RED BLOOD CELL COUNT.: 4.58 M/ÂµL (ref 4.00–6.00)
BKR WAM WHITE BLOOD CELL COUNT: 8.8 x1000/ÂµL (ref 4.0–11.0)

## 2022-02-11 LAB — HEPATIC FUNCTION PANEL
BKR A/G RATIO: 0.4 — ABNORMAL LOW (ref 1.0–2.2)
BKR ALANINE AMINOTRANSFERASE (ALT): 35 U/L (ref 10–35)
BKR ALBUMIN: 2.7 g/dL — ABNORMAL LOW (ref 3.6–4.9)
BKR ALKALINE PHOSPHATASE: 108 U/L (ref 9–122)
BKR BILIRUBIN TOTAL: <0.2 mg/dL (ref <=1.2)
BKR GLOBULIN: 7.3 g/dL — ABNORMAL HIGH (ref 2.3–3.5)
BKR PROTEIN TOTAL: 10 g/dL — ABNORMAL HIGH (ref 6.6–8.7)

## 2022-02-11 LAB — LIPASE: BKR LIPASE: 19 U/L (ref 11–55)

## 2022-02-11 LAB — MAGNESIUM: BKR MAGNESIUM: 2.2 mg/dL (ref 1.7–2.4)

## 2022-02-11 LAB — SARS COV-2 (COVID-19) RNA: BKR SARS-COV-2 RNA (COVID-19) (YH): NEGATIVE

## 2022-02-11 LAB — DIRECT SODIUM (LAB ORDERABLE ONLY) (BH YH): BKR DIRECT SODIUM: 146 mmol/L — ABNORMAL HIGH (ref 136–144)

## 2022-02-11 MED ORDER — ROSUVASTATIN 10 MG TABLET
10 mg | Freq: Every evening | GASTROSTOMY | Status: DC
Start: 2022-02-11 — End: 2022-08-15
  Administered 2022-02-12 – 2022-08-14 (×104): 10 mg via GASTROSTOMY

## 2022-02-11 MED ORDER — SODIUM CHLORIDE 0.9 % BOLUS (NEW BAG)
0.9 % | Freq: Once | INTRAVENOUS | Status: DC
Start: 2022-02-11 — End: 2022-02-11

## 2022-02-11 MED ORDER — SODIUM CHLORIDE 0.9 % (FLUSH) INJECTION SYRINGE
0.9 % | Freq: Three times a day (TID) | INTRAVENOUS | Status: DC
Start: 2022-02-11 — End: 2022-07-06
  Administered 2022-02-12 – 2022-05-26 (×121): 0.9 mL via INTRAVENOUS

## 2022-02-11 MED ORDER — APIXABAN 5 MG TABLET
5 mg | Freq: Two times a day (BID) | GASTROSTOMY | Status: DC
Start: 2022-02-11 — End: 2022-02-11

## 2022-02-11 MED ORDER — FERROUS SULFATE 300 MG (60 MG IRON)/5 ML ORAL LIQUID
603005 mg (300 mg Ferrous sulfate) / 5 mL | Freq: Two times a day (BID) | GASTROSTOMY | Status: DC
Start: 2022-02-11 — End: 2022-03-18
  Administered 2022-02-12 – 2022-03-17 (×67): 60 mL via GASTROSTOMY

## 2022-02-11 MED ORDER — ACETAMINOPHEN 160 MG/5 ML ORAL ELIXIR
1605 mg/5 mL | Freq: Four times a day (QID) | GASTROSTOMY | Status: AC | PRN
Start: 2022-02-11 — End: 2022-08-14

## 2022-02-11 MED ORDER — DEXTROSE 10 % IV BOLUS FOR ORDERABLE
INTRAVENOUS | Status: DC | PRN
Start: 2022-02-11 — End: 2022-02-11

## 2022-02-11 MED ORDER — ACETAMINOPHEN 650 MG/20.3 ML ORAL SUSPENSION
65020.3 mg/20.3 mL | Freq: Four times a day (QID) | GASTROSTOMY | Status: DC | PRN
Start: 2022-02-11 — End: 2022-02-12

## 2022-02-11 MED ORDER — ROSUVASTATIN 10 MG TABLET
10 mg | Freq: Every day | GASTROSTOMY | Status: DC
Start: 2022-02-11 — End: 2022-02-11

## 2022-02-11 MED ORDER — FAMOTIDINE 40 MG/5 ML (8 MG/ML) ORAL SUSPENSION
8 mg/ml | Freq: Every day | GASTROSTOMY | Status: DC
Start: 2022-02-11 — End: 2022-03-18
  Administered 2022-02-14 – 2022-03-17 (×32): 8 mL via GASTROSTOMY

## 2022-02-11 MED ORDER — INSULIN LISPRO 100 UNIT/ML (SLIDING SCALE)
100 unit/mL | Freq: Three times a day (TID) | SUBCUTANEOUS | Status: DC
Start: 2022-02-11 — End: 2022-02-11

## 2022-02-11 MED ORDER — CEFTRIAXONE IV PUSH 1000 MG VIAL & NS (ADULTS)
Freq: Once | INTRAVENOUS | Status: CP
Start: 2022-02-11 — End: ?
  Administered 2022-02-11: 13:00:00 10.000 mL via INTRAVENOUS

## 2022-02-11 MED ORDER — PYRIDOXINE (VITAMIN B6) 50 MG TABLET
50 mg | Freq: Every day | GASTROSTOMY | Status: DC
Start: 2022-02-11 — End: 2022-03-18
  Administered 2022-02-12 – 2022-03-17 (×34): 50 mg via GASTROSTOMY

## 2022-02-11 MED ORDER — GLUCAGON 1 MG/ML IN STERILE WATER
Freq: Once | INTRAMUSCULAR | Status: DC | PRN
Start: 2022-02-11 — End: 2022-06-30

## 2022-02-11 MED ORDER — PSYLLIUM ORAL PACKET
Status: AC
Start: 2022-02-11 — End: 2022-08-14

## 2022-02-11 MED ORDER — NIACIN IMMEDIATE RELEASE 100 MG TABLET
100 mg | Freq: Every evening | GASTROSTOMY | Status: DC
Start: 2022-02-11 — End: 2022-03-18
  Administered 2022-02-12 – 2022-03-17 (×34): 100 mg via GASTROSTOMY

## 2022-02-11 MED ORDER — APIXABAN 5 MG TABLET
5 mg | Freq: Two times a day (BID) | GASTROSTOMY | Status: AC
Start: 2022-02-11 — End: ?

## 2022-02-11 MED ORDER — DEXTROSE 10 % IV BOLUS FOR ORDERABLE
INTRAVENOUS | Status: DC | PRN
Start: 2022-02-11 — End: 2022-08-15

## 2022-02-11 MED ORDER — FOLIC ACID 1 MG TABLET
1 mg | Freq: Every day | GASTROSTOMY | Status: DC
Start: 2022-02-11 — End: 2022-03-18
  Administered 2022-02-12 – 2022-03-17 (×34): 1 mg via GASTROSTOMY

## 2022-02-11 MED ORDER — SKIM MILK
ORAL | Status: DC | PRN
Start: 2022-02-11 — End: 2022-08-15

## 2022-02-11 MED ORDER — THIAMINE HCL (VITAMIN B1) 100 MG TABLET
100 mg | Freq: Every day | GASTROSTOMY | Status: DC
Start: 2022-02-11 — End: 2022-02-11

## 2022-02-11 MED ORDER — FERROUS SULFATE 300 MG (60 MG IRON)/5 ML ORAL LIQUID
603005 mg (300 mg Ferrous sulfate) / 5 mL | Status: AC
Start: 2022-02-11 — End: 2022-08-14

## 2022-02-11 MED ORDER — INSULIN LISPRO (U-100) 100 UNIT/ML SUBCUTANEOUS SOLUTION
100 unit/mL | Freq: Three times a day (TID) | SUBCUTANEOUS | Status: AC
Start: 2022-02-11 — End: ?

## 2022-02-11 MED ORDER — CHLORHEXIDINE GLUCONATE 0.12 % MOUTHWASH
0.12 % | Freq: Two times a day (BID) | OROMUCOSAL | Status: DC
Start: 2022-02-11 — End: 2022-04-01
  Administered 2022-02-12 – 2022-04-01 (×94): 0.12 mL via OROMUCOSAL

## 2022-02-11 MED ORDER — LANSOPRAZOLE 3 MG/ML ORAL SUSPENSION
3 mg/mL | Freq: Every day | GASTROSTOMY | Status: DC
Start: 2022-02-11 — End: 2022-02-11

## 2022-02-11 MED ORDER — FOLIC ACID 1 MG TABLET
1 mg | Freq: Every day | GASTROSTOMY | Status: AC
Start: 2022-02-11 — End: 2022-08-14

## 2022-02-11 MED ORDER — PIPERACILLIN-TAZOBACTAM (ZOSYN) 3.375GM MBP
Freq: Three times a day (TID) | INTRAVENOUS | Status: DC
Start: 2022-02-11 — End: 2022-02-14
  Administered 2022-02-12 – 2022-02-14 (×8): 50.000 mL/h via INTRAVENOUS

## 2022-02-11 MED ORDER — DEXTROSE 15 GRAM/60 ML ORAL LIQUID
15 gram/60 mL | ORAL | Status: DC | PRN
Start: 2022-02-11 — End: 2022-02-11

## 2022-02-11 MED ORDER — INSULIN ASPART U-100  100 UNIT/ML SUBCUTANEOUS SOLUTION
100 unit/mL | Freq: Before meals | SUBCUTANEOUS | Status: SS
Start: 2022-02-11 — End: 2022-02-14

## 2022-02-11 MED ORDER — THIAMINE HCL (VITAMIN B1) 100 MG TABLET
100 mg | Freq: Two times a day (BID) | GASTROSTOMY | Status: DC
Start: 2022-02-11 — End: 2022-03-18
  Administered 2022-02-12 – 2022-03-17 (×68): 100 mg via GASTROSTOMY

## 2022-02-11 MED ORDER — ACETAMINOPHEN 325 MG TABLET
325 mg | Freq: Once | GASTROSTOMY | Status: CP
Start: 2022-02-11 — End: ?
  Administered 2022-02-11: 12:00:00 325 mg via GASTROSTOMY

## 2022-02-11 MED ORDER — FOLIC ACID 1 MG TABLET
1 mg | Freq: Every day | GASTROSTOMY | Status: DC
Start: 2022-02-11 — End: 2022-02-11

## 2022-02-11 MED ORDER — DEXTROSE 15 GRAM/60 ML ORAL LIQUID
1560 gram/60 mL | ORAL | Status: DC | PRN
Start: 2022-02-11 — End: 2022-08-15

## 2022-02-11 MED ORDER — FRUIT JUICE
ORAL | Status: DC | PRN
Start: 2022-02-11 — End: 2022-08-15

## 2022-02-11 MED ORDER — GLUCAGON 1 MG/ML IN STERILE WATER
Freq: Once | INTRAMUSCULAR | Status: DC | PRN
Start: 2022-02-11 — End: 2022-02-11

## 2022-02-11 MED ORDER — NIACIN IMMEDIATE RELEASE 50 MG TABLET
50 mg | Freq: Every evening | GASTROSTOMY | Status: AC
Start: 2022-02-11 — End: 2022-08-14

## 2022-02-11 MED ORDER — THIAMINE HCL (VITAMIN B1) 100 MG TABLET
100 mg | Freq: Two times a day (BID) | GASTROSTOMY | Status: AC
Start: 2022-02-11 — End: 2022-08-14

## 2022-02-11 MED ORDER — ACETAMINOPHEN 500 MG TABLET
500 mg | Status: AC
Start: 2022-02-11 — End: 2022-08-14

## 2022-02-11 MED ORDER — FRUIT JUICE
ORAL | Status: DC | PRN
Start: 2022-02-11 — End: 2022-02-11

## 2022-02-11 MED ORDER — PYRIDOXINE (VITAMIN B6) 100 MG TABLET
100 mg | Freq: Every day | GASTROSTOMY | Status: AC
Start: 2022-02-11 — End: 2022-08-14

## 2022-02-11 MED ORDER — ROSUVASTATIN 10 MG TABLET
10 mg | Freq: Every evening | GASTROSTOMY | Status: AC
Start: 2022-02-11 — End: ?

## 2022-02-11 MED ORDER — FERROUS SULFATE 300 MG (60 MG IRON)/5 ML ORAL LIQUID
60 mg (300 mg Ferrous sulfate) / 5 mL | Freq: Two times a day (BID) | GASTROSTOMY | Status: DC
Start: 2022-02-11 — End: 2022-02-11

## 2022-02-11 MED ORDER — SKIM MILK
ORAL | Status: DC | PRN
Start: 2022-02-11 — End: 2022-02-11

## 2022-02-11 MED ORDER — APIXABAN 5 MG TABLET
5 mg | Freq: Two times a day (BID) | GASTROSTOMY | Status: DC
Start: 2022-02-11 — End: 2022-02-12
  Administered 2022-02-12: 03:00:00 5 mg via GASTROSTOMY

## 2022-02-11 MED ORDER — THERAPEUTIC MULTIVITAMIN ORAL LIQUID
Freq: Every day | GASTROSTOMY | Status: AC
Start: 2022-02-11 — End: 2022-08-14

## 2022-02-11 MED ORDER — CHLORHEXIDINE GLUCONATE 0.12 % MOUTHWASH
0.12 % | Freq: Two times a day (BID) | OROMUCOSAL | Status: AC
Start: 2022-02-11 — End: 2022-08-14

## 2022-02-11 MED ORDER — PANTOPRAZOLE DR 40 MG GRANULES DELAYED-RELEASE FOR SUSP IN PACKET
40 mg | Freq: Every day | GASTROSTOMY | Status: AC
Start: 2022-02-11 — End: 2022-08-14

## 2022-02-11 MED ORDER — ACETAMINOPHEN 650 MG/20.3 ML ORAL SUSPENSION
650 mg/20.3 mL | Freq: Four times a day (QID) | GASTROSTOMY | Status: DC | PRN
Start: 2022-02-11 — End: 2022-02-11

## 2022-02-11 MED ORDER — LACTATED RINGERS INTRAVENOUS SOLUTION
INTRAVENOUS | Status: AC
Start: 2022-02-11 — End: ?
  Administered 2022-02-12: 03:00:00 1000.000 mL/h via INTRAVENOUS

## 2022-02-11 MED ORDER — SODIUM CHLORIDE 0.9 % (FLUSH) INJECTION SYRINGE
0.9 % | INTRAVENOUS | Status: DC | PRN
Start: 2022-02-11 — End: 2022-06-30

## 2022-02-11 MED ORDER — IOHEXOL (OMNIPAQUE) 350 MG/ML ORAL SOLUTION 25 ML IN WATER 900 ML
Freq: Once | ORAL | Status: CP
Start: 2022-02-11 — End: ?
  Administered 2022-02-12: 07:00:00 900.000 mL via ORAL

## 2022-02-11 MED ORDER — LACTATED RINGERS IV BOLUS (NEW BAG)
Freq: Once | INTRAVENOUS | Status: CP
Start: 2022-02-11 — End: ?
  Administered 2022-02-11: 12:00:00 1000.000 mL/h via INTRAVENOUS

## 2022-02-11 NOTE — ED Notes
4:20 AM Pt brought to AED by sister from SNF called [facility] at [facility] in [place]. Sitter at bedside stating that pt was living in [place] when she was admitted to the hospital in November 2022 for perforated diverticulum and large volume pneumoperitoneum. During hospital stay, she had multiple complications such as b/l DVTs, delirium, and PEG placement. Pt was discharged from hospital to aforementioned SNF and has been there until sister picked her up and brought her to [name] ED for long-term placement. Sister tearful to situation and understandably upset over the care pt received at hospital & SNF. Upon initial assessment, pt appears disheveled. Noted colostomy bag & G-tube in place. Placed on full cardiac monitor. + fever. Blood & COVID collected. Pt straight cath for urine sample and only output was very small amount of purlent drainage. MD aware of this. 4:35 AM K+ 7.4 on chem8. Awaiting BMP results.5:36 AM K+ hemolyzed on BMP. Plan for additional BMP & chem8. 6:15 AM K+  6.3 on repeat chem8. MD made aware pt needs [place] PIV. 7:01 AM MD established [place] PIV. Repeat BMP sent to lab. LR bolus initiated.

## 2022-02-11 NOTE — ED Notes
2:32 PM Assumed care and report. Minimally verbal. Admitted to for UTI. Has G tube  And two ostomy's. No past medical history on file.3:05 PM Pt and care turned over to on coming RN.

## 2022-02-11 NOTE — ED Notes
7:09 AM Report received from outgoing RN. Pt here for fever/ dehydration/ failure to thrive. Pt with ostomy/ G tube/ bed bound baseline. Needs Ceftriaxone. Pt has [place] line in place. Prior RN unable to obtain urine sample by straight cath, LR bolus infusing per [facility], MD Mariam Dollar aware. Pending admit bed. Pts brother at bedside. 7:33 AM Pt medicated per MAR. Pts brother updated on plan of care. 1:21 AM Pts [place] IV infiltrated, new PIV placed to R AC. LR infusing per MAR. 2:24 PM Pt incontinent of urine. Incontinence care provided, linens changed, pt repositioned for comfort. MD Mariam Dollar asked about tube feeds, awaiting order. 2:26 PM Report given to [facility] RN. Care transferred.

## 2022-02-11 NOTE — Utilization Review (ED)
UM Status: Managed Medicare IP, determined inpatient. SIRS, failure to thrive

## 2022-02-11 NOTE — ED Provider Notes
Chief Complaint Patient presents with ? Medical Problem   Pt arrives by private car via pt's sister who picked her up from [facility] in [place] after 80 day stay. Sister reports she was told to pick her up but is unable to manage care for patient who now has a G tube and colostomy.  Sister reports she was told to pick her up because of insurance and she needs long care. Pt was admitted for LBO, malnutrition, failure to thrive, metabolic encephalopathy, bilateral femoral DVTs Medical Decision MakingPGY-3 Emergency Medicine Resident MDM: ?Presentation: ?Pt is a 66 y.o. 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  Patient was living in [place] when she was admitted on November 27th with perforated diverticulum and large volume pneumoperitoneum.  Hospital course was complicated by delirium, TPN followed by PEG placement, bilateral DVTs.  Patient was discharged on 02/15 to [facility].  Patient brought to [place] by sister who was unable to care for her at home, brought to the hospital for long-term care placement.On arrival patient lying in bed no acute distress, tachycardic, dry mucous membranes, soft nontender abdomen with G-tube, colostomy bag and additional surgical state drain bag in place.  Patient states she has not been vomiting, takes everything by G-tube, Assessment/Plan:Patient with complicated surgical history and failure to thrive with previous skilled nursing facility placement, likely chronic malnourished met will assess for electrolyte abnormalities, dehydration, will rule out infection with chest x-ray and urinalysisPatient will be admitted to Medicine observation?This patient was presented to and discussed with Dr. Janice Norrie and a treatment plan and disposition were collaboratively agreed upon.ED Course:Re-evaluation: - I have reviewed the lab & imaging results which is notable for negative COVID, normal leukocytosis, multiple labs have heme all size, Chem 8 with elevated potassium but likely hemolyzed.  Will obtain EKG.Unable to obtain urine by straight cath given just some pus and no other fluid.  Patient likely severely dehydrated.  Will give 1 L IV fluid.  Patient became febrile, will give ceftriaxone for presumed UTI.  Admit to medicine.Emeterio Reeve, [facility] Emergency MedicineCan be contacted via MHB2/21/2023 7:04 AMAmount and/or Complexity of Data ReviewedLabs: ordered.Radiology: ordered.ECG/medicine tests: ordered.RiskOTC drugs.Prescription drug management.Decision regarding hospitalization.  Physical ExamED Triage Vitals [02/11/22 0255]BP: 132/89Pulse: (!) 124Pulse from  O2 sat: n/aResp: 20Temp: 97 ?F (36.1 ?C)Temp src: TemporalSpO2: 97 % BP 114/75  - Pulse (!) 98  - Temp (!) 100.3 ?F (37.9 ?C) (Rectal)  - Resp 18  - SpO2 99% Physical Exam ProceduresAttestation/Critical CarePatient Reevaluation: Attending Supervised: ResidentI saw and examined the patient. I agree with the findings and plan of care as documented in the resident's note except as noted below. Additional acute and/or chronic problems addressed: 1 F hx of RA, was living in [place] when she was dx with perforated diverticulitis with large pneumoperitonitis, treated with colectomy/colostomy, with subsequent Bilat DVTs (on [facility]), TPN requirement and metabolic encephalopathy, and ultimately discharge to ECF. Pt was discharged from ECF to sister, who is unable to care for her, and drover her here from NCFound to be febrile and tachy in ED, appears dehydratedLabs unremarkable, unable to obtain U/A 2/2 to dehydration, CXR unremarkableAdmit for fever, dehydration and failure to thriveAlyssa R FrenchCritical care provided by attending: no critical carePatient  progress: stableClinical Impressions as of 02/11/22 0734 Fever, unspecified fever cause Dehydration Adult failure to thrive  ED DispositionAdmit Roney Marion, MD MPH02/21/23 0700 Roney Marion, MD MPH02/21/23 0701 Angelita Ingles, MDResident02/21/23 1610 Roney Marion, MD MPH02/21/23 [phone]

## 2022-02-12 ENCOUNTER — Inpatient Hospital Stay: Admit: 2022-02-12 | Payer: PRIVATE HEALTH INSURANCE

## 2022-02-12 DIAGNOSIS — R627 Adult failure to thrive: Secondary | ICD-10-CM

## 2022-02-12 LAB — MANUAL DIFFERENTIAL
BKR WAM BANDS (DIFF) (1 DEC): 14.8 % — ABNORMAL HIGH (ref 0.0–10.0)
BKR WAM BASOPHIL - ABS (DIFF) 2 DEC: 0 x 1000/ÂµL (ref 0.00–1.00)
BKR WAM BASOPHILS (DIFF): 0 % (ref 0.0–1.4)
BKR WAM EOSINOPHILS (DIFF) 2 DEC: 0 x 1000/ÂµL (ref 0.00–1.00)
BKR WAM EOSINOPHILS (DIFF): 0 % (ref 0.0–5.0)
BKR WAM LYMPHOCYTE - ABS (DIFF) 2 DEC: 0 x 1000/ÂµL — ABNORMAL LOW (ref 0.60–3.70)
BKR WAM LYMPHOCYTES (DIFF): 0 % — ABNORMAL LOW (ref 17.0–50.0)
BKR WAM MONOCYTE - ABS (DIFF) 2 DEC: 0 x 1000/ÂµL (ref 0.00–1.00)
BKR WAM MONOCYTES (DIFF): 0 % — ABNORMAL LOW (ref 4.0–12.0)
BKR WAM NEUTROPHILS (DIFF): 85.2 % — ABNORMAL HIGH (ref 39.0–72.0)
BKR WAM NEUTROPHILS - ABS (DIFF) 2 DEC: 11 x 1000/ÂµL — ABNORMAL HIGH (ref 2.00–7.60)

## 2022-02-12 LAB — URINALYSIS WITH CULTURE REFLEX      (BH LMW YH)
BKR BILIRUBIN, UA: NEGATIVE
BKR BLOOD, UA: NEGATIVE
BKR GLUCOSE, UA: NEGATIVE
BKR KETONES, UA: NEGATIVE
BKR LEUKOCYTE ESTERASE, UA: NEGATIVE
BKR NITRITE, UA: NEGATIVE
BKR PH, UA: 6.5 (ref 5.5–7.5)
BKR SPECIFIC GRAVITY, UA: 1.025 (ref 1.005–1.030)
BKR UROBILINOGEN, UA (MG/DL): <2 mg/dL (ref <=2.0)

## 2022-02-12 LAB — RESPIRATORY VIRUS PCR PANEL  (YH VERIGENE)(LAB ORDER ONLY)
BKR ADENOVIRUS: NEGATIVE
BKR HUMAN METAPNEUMOVIRUS (HMPV): NEGATIVE
BKR INFLUENZA A: NEGATIVE
BKR INFLUENZA B: NEGATIVE
BKR PARAINFLUENZA VIRUS 1: NEGATIVE
BKR PARAINFLUENZA VIRUS 2: NEGATIVE
BKR PARAINFLUENZA VIRUS 3: NEGATIVE
BKR PARAINFLUENZA VIRUS 4: NEGATIVE
BKR RESPIRATORY SYNCYTIAL VIRUS: NEGATIVE
BKR RHINOVIRUS: NEGATIVE

## 2022-02-12 LAB — CBC WITH AUTO DIFFERENTIAL
BKR WAM ABSOLUTE NRBC (2 DEC): 0 x 1000/ÂµL (ref 0.00–1.00)
BKR WAM HEMATOCRIT (2 DEC): 31.8 % — ABNORMAL LOW (ref 35.00–45.00)
BKR WAM HEMOGLOBIN: 9.4 g/dL — ABNORMAL LOW (ref 11.7–15.5)
BKR WAM MCH (PG): 26.6 pg — ABNORMAL LOW (ref 27.0–33.0)
BKR WAM MCHC: 29.6 g/dL — ABNORMAL LOW (ref 31.0–36.0)
BKR WAM MCV: 89.8 fL (ref 80.0–100.0)
BKR WAM MPV: 9.7 fL (ref 8.0–12.0)
BKR WAM NUCLEATED RED BLOOD CELLS: 0 % (ref 0.0–1.0)
BKR WAM PLATELETS: 350 x1000/ÂµL (ref 150–420)
BKR WAM RDW-CV: 19.8 % — ABNORMAL HIGH (ref 11.0–15.0)
BKR WAM RED BLOOD CELL COUNT.: 3.54 M/ÂµL — ABNORMAL LOW (ref 4.00–6.00)
BKR WAM WHITE BLOOD CELL COUNT: 11 x1000/ÂµL (ref 4.0–11.0)

## 2022-02-12 LAB — URINE MICROSCOPIC     (BH GH LMW YH)

## 2022-02-12 LAB — BASIC METABOLIC PANEL
BKR ANION GAP: 11 (ref 7–17)
BKR BLOOD UREA NITROGEN: 16 mg/dL (ref 8–23)
BKR BUN / CREAT RATIO: 22.2 (ref 8.0–23.0)
BKR CALCIUM: 11.3 mg/dL — ABNORMAL HIGH (ref 8.8–10.2)
BKR CHLORIDE: 109 mmol/L — ABNORMAL HIGH (ref 98–107)
BKR CO2: 24 mmol/L (ref 20–30)
BKR CREATININE: 0.72 mg/dL (ref 0.40–1.30)
BKR EGFR, CREATININE (CKD-EPI 2021): >60 mL/min/{1.73_m2} (ref >=60)
BKR GLUCOSE: 97 mg/dL (ref 70–100)
BKR POTASSIUM: 3.3 mmol/L (ref 3.3–5.3)
BKR SODIUM: 144 mmol/L (ref 136–144)

## 2022-02-12 LAB — UA REFLEX CULTURE

## 2022-02-12 LAB — PARTIAL THROMBOPLASTIN TIME     (BH GH LMW Q YH)
BKR PARTIAL THROMBOPLASTIN TIME: 21.7 s — ABNORMAL LOW (ref 23.0–31.4)
BKR PARTIAL THROMBOPLASTIN TIME: 21.1 s — ABNORMAL LOW (ref 23.0–31.4)
BKR PARTIAL THROMBOPLASTIN TIME: 22.5 s — ABNORMAL LOW (ref 23.0–31.4)

## 2022-02-12 LAB — PROCALCITONIN     (BH GH LMW Q YH): BKR PROCALCITONIN: 0.22 ng/mL

## 2022-02-12 MED ORDER — HEPARIN 25,000 UNIT/250 ML D5W (HIGH RISK OF BLEEDING-ALL SITES)
25000 unit/250 mL | INTRAVENOUS | Status: DC
Start: 2022-02-12 — End: 2022-02-16
  Administered 2022-02-12 – 2022-02-15 (×5): via INTRAVENOUS

## 2022-02-12 MED ORDER — IOHEXOL 350 MG IODINE/ML INTRAVENOUS SOLUTION
350 mg iodine/mL | Freq: Once | INTRAVENOUS | Status: CP | PRN
Start: 2022-02-12 — End: ?
  Administered 2022-02-12: 10:00:00 350 mL via INTRAVENOUS

## 2022-02-12 MED ORDER — ACETAMINOPHEN 650 MG/20.3 ML ORAL SUSPENSION
650 mg/20.3 mL | Freq: Four times a day (QID) | GASTROSTOMY | Status: DC | PRN
Start: 2022-02-12 — End: 2022-03-18
  Administered 2022-02-12 – 2022-03-14 (×14): 650 mL via GASTROSTOMY

## 2022-02-12 MED ORDER — SODIUM CHLORIDE 0.9 % LARGE VOLUME SYRINGE FOR AUTOINJECTOR
Freq: Once | INTRAVENOUS | Status: CP | PRN
Start: 2022-02-12 — End: ?
  Administered 2022-02-12: 10:00:00 via INTRAVENOUS

## 2022-02-12 MED ORDER — APIXABAN 5 MG TABLET
5 mg | Freq: Two times a day (BID) | GASTROSTOMY | Status: DC
Start: 2022-02-12 — End: 2022-03-28

## 2022-02-12 NOTE — Plan of Care
Inpatient Occupational Therapy EvaluationDefault Flowsheet Data (most recent)   IP Adult OT Eval/Treat - 02/12/22 0959    Date of Visit / Treatment  Date of Visit / Treatment 02/12/22   Note Type Evaluation   Progress Report Due 02/26/22   End Time 0959    General Information  Pertinent History Of Current Problem Per chart: 66 y.o. 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  Was deemed to lack capaciity, however required ethics consult as family difficult to contact for medical decision making, s/p PEG placement at that time.  Course further c/b b/l DVT, started on AC.  Now presented to [facility] for placement as family unable to care for her, found to be febrile/tachycardic   Subjective Non verbal t/o session   General Observations Pt received supine in stretcher at start of session. RA. NAD. RN cleared. Pt agreeable   Precautions/Limitations Fall Precautions;Bed alarm;Chair alarm    Prior Level of Functioning/Social History  Additional Comments Unclear PLOF, Per chart, Pt recently relocated from [place] to [place] and lives w/ sister. Pt non-verbal t/o session    Vital Signs and Orthostatic Vital Signs  Vital Signs Vital Signs Stable   Vital Signs Free text RA, NAD    Pain/Comfort  Pain Comment (Pre/Post Treatment Pain) Pt vocalizing and withdrawing w/ PROM BLE. RN aware. Pt repositioned for comfort    Patient Coping  Observed Emotional State withdrawn   Additional Patient Coping Comments Non verbal t/o session    Cognition  Overall Cognitive Status Impaired   Orientation Level Disoriented X4   Turns to name. Inconsistently  Level of Consciousness lethargic   Following Commands Unable to follow any commands   Personal Safety / Judgment Fall risk;Requires supervision with mobility   Attention Sustained impairment   Affect Flat   Cognition Comments Minimally vocal t/o session. Decreased attention to task. Inconsistently localizing to name    Vision/ Hearing  Vision Assessment Comments No volitional occular tracking to auditory stim   Hearing difficulties / Use of hearing aids + withdraw to sound    Range of Motion  Range of Motion Examination WFL except   Range of Motion Comments UTA LLE ROM 2/2 withdraw to touch    Manual Muscle Testing  Manual Muscle Testing Comments deconditioned    Musculoskeletal  LUE Muscle Strength Grading 3-->active movement against gravity   RUE Muscle Strength Grading 3-->active movement against gravity   Musculoskeletal Comments UTA BLE    Muscle Tone  Muscle Tone Testing Results No muscle tone deficits noted    Coordination  Opposition Impaired bilaterally   Fine Motor Coordination Grossly impaired   Coordination Comments Grossly Impaired    Peripheral Neurovascular  Edema  none present    Sensory Assessment  Sensory Tests Results No sensory impairment noted   Sensory Assessment Comments + withdraw to pain    Skin Assessment  Skin Assessment See Nursing Documentation   Skin Assessment Comments Skin tears noted L shin and brusing bilateral ankles    Balance  Balance Skills Training Comment Bed level 2/2 impaired cognition and discomfort w/ BLE    BADL Retraining  BADL Retraining Comment Dependent     AM-PAC - Daily Activity IP Short Form  Help needed from another person putting on/taking off regular lower body clothing 1 - Unable   Help needed from another person  for bathing (incl. washing, rinsing, drying) 1 - Unable   Help needed from another person for toileting (incl. using toilet, bedpan, urinal) 1 - Unable   Help needed from another person putting on/taking off regular upper body clothing 1 - Unable   Help needed from another person taking care of personal grooming such as brushing teeth 1 - Unable   Help needed from another person eating meals 1 - Unable   AM-PAC Daily Activity Raw Score (Total of rows above) 6   CMS Score (based on Raw Score - with G Code) 6 - 100% impaired     (G Code - CN)    Bed Mobility  Bed Mobility Comments Max A x2 repositioning in bed    Handoff Documentation  Handoff Patient in bed;Bed alarm;Patient instructed to call nursing for mobility;Discussed with nursing    Endurance  Endurance Comments Poor    Therapeutic Exercise  Therapeutic Exercise Comments PROM BUE    Therapeutic Functional Activity  Therapeutic Functional Activity Comments Repositioning, delirium prevention, reorientation    Clinical Impression  Initial Assessment Pt seen today for initial OT evaluation. Pt completing bed level evaluation limited significantly by decreased arousal, command following, and discomfort w/ ROM of BLE. Pt dependent w/ ADLs. Pt would benefit from continued OT services. Pending medical progress, rec d/c to STR to maximize rehab potential.   Criteria for Skilled Therapeutic Interventions Met yes;treatment indicated   Impairments Found (describe specific impairments) attention;BADL's;cognition;gait;IADL's;mobility;motor function;posture;muscle strength   Rehab Potential fair, will monitor progress closely    Patient/Family Stated Goals  Patient/Family Stated Goal(s) unable to state    Frequency/Equipment Recommendations  OT Frequency 2x per week   What day of week is next treatment expected? Friday   2/24  OT/OTA completing this assessment MH    OT Recommendations for Inpatient Admission  Activity/Level of Assist transfers only;mechanical lift   ADL Recommendations mechanical lift    Planned Treatment / Interventions  Plan for Next Visit POC   General Treatment / Interventions Activities of Daily Living   Training Treatment / Interventions Functional Mobility Training   Education Treatment / Interventions Patient Education / Training    OT Discharge Summary  Disposition Recommendation Short Term Rehab     Problem: Occupational Therapy GoalsGoal: Occupational Therapy GoalsDescription: OT goals1. Pt will move to EOB for seated ADL mod I2. Pt will follow 3/4 simple one step motor commands IND3. Pt will sequence through set up ADL task INDOutcome: Initial problem identification Carlton Adam OTR/LMHB: [phone]

## 2022-02-12 NOTE — Progress Notes
[facility]	 [facility]	Daily Progress NoteHospital Medicine ServiceAttending Provider: Harlen Labs Suella [place], [place] [id number]:  EX09/EX09Hospital Day #1Subjective   Patient seen and examined on 02/12/2022, 1:32 PM.CC: Fever, unspecified fever causeInterim History: Boarding in ED hallwayUnable to elicit complaintSeems comfortableROS:ROS reviewed and otherwise negative.  Objective  Current Meds:apixaban, 5 mg, Q12Hchlorhexidine gluconate, 15 mL, BIDElemental iron, 45 mg, BIDfamotidine, 20 mg, Dailyfolic acid, 1 mg, Dailyniacin, 100 mg, Nightlypiperacillin-tazobactam, 3.375 g, Q8Hpyridoxine (vitamin B6), 100 mg, Dailyrosuvastatin, 10 mg, Nightlysodium chloride, 3 mL, Q8Hthiamine, 100 mg, BID Vitals:Temp:  [98.2 ?F (36.8 ?C)-101.3 ?F (38.5 ?C)] 99.5 ?F (37.5 ?C)Pulse:  [59-120] 59Resp:  [14-20] 16BP: (93-140)/(64-83) 96/66SpO2:  [95 %-98 %] 95 %Device (Oxygen Therapy): room air  Labs:I have reviewed the patient's pertinent labs as resulted in the EMR.Lab Results Component Value Date  NA 144 02/12/2022  K 3.3 02/12/2022  CL 109 (H) 02/12/2022  CO2 24 02/12/2022  BUN 16 02/12/2022  CREATININE 0.72 02/12/2022  CALCIUM 11.3 (H) 02/12/2022 Lab Results Component Value Date  WBC 11.0 02/12/2022  HGB 9.4 (L) 02/12/2022  HCT 31.80 (L) 02/12/2022  HCT 42.0 02/11/2022  MCV 89.8 02/12/2022  PLT 350 02/12/2022 I/O's:Intake/Output Summary (Last 24 hours) at 02/12/2022 1332Last data filed at 02/12/2022 0726Gross per 24 hour Intake 1100 ml Output -- Net 1100 ml  Physical Exam Awake, L deviated gazeNC/AT MMMNo JVDLungs clearRRRAbd soft, nontenderNo edemaNo rashesImaging:[place] Abdomen Pelvis w IV ContrastResult Date: 2/22/2023CT ABDOMEN PELVIS W IV CONTRAST on 02/12/2022 4:54 AM INDICATION: Abdominal abscess/infection suspected. COMPARISON: None TECHNIQUE: [place] images of the abdomen and pelvis were obtained after the administration of 80 cc intravenous contrast (Omnipaque 350). Technical limitations: None. FINDINGS: Lung bases: Dependent atelectasis and patchy groundglass changes are seen. Hepatobiliary: Unremarkable. Pancreas: Unremarkable. Spleen: Unremarkable. Adrenal glands: Unremarkable. Kidneys: Simple appearing left renal cyst. Bowel: Percutaneous gastrostomy in situ. 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 Oral contrast extends into right lower quadrant enterostomy. Extensive diverticulosis in the large bowel. No definite findings of acute diverticulitis. Abdominal and pelvic lymph nodes: No lymphadenopathy. Peritoneum: Multiple peritoneal nodules are seen, most conspicuous adjacent to the gallbladder and along right lateral conal fascia. Vasculature: No abdominal aortic aneurysm. Pelvis: Calcified uterine fibroids. Hypodense thickening of endometrium, measuring 1.4 cm. Musculoskeletal system and soft tissue: No aggressive osseous lesion. No collection in abdomen or pelvis. 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 Correlation with prior imaging is recommended. Reported And Signed By: Madie Reno, MD  [facility]ical [facility]  I reviewed lab results, microbiology, imaging, test results and EKG in formulating this patient's plan of care.   Assessment: 66 y.o. 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  Was deemed to lack capaciity, however required ethics consult as family difficult to contact for medical decision making, s/p PEG placement at that time.  Course further c/b b/l DVT, started on AC.  Now presented to [facility] for placement as family unable to care for her, found to be febrile/tachycardicPlan: #. SIRS- no clear source thus far- was started on zosyn, ?consider dc- check DFA- incidental imaging findings c/f peritoneal carcinomatosis.  Check [place] head as below as well as [place] chest.  Consider onc consult - consider lumbar puncture if workup otherwise unrevealing given MS#. Encephalopathy#. FTT- ?near baseline per notes- noted extensive prior OSH workup, check head   for anything new acute- check tsh/b12- infectious mgmt as above, consider lumbar puncture#. DVT- continue eliquis #. Diet: Diet Tube Feed No Tray#. PPx: Eliquis#. Medication Reconciliation: Completed previously#. Dispo: TBD pending course Attempted to contact sister/brother listed in chart.  No answer on either number, unable to leave voicemailMedical decision making for this patient was high.I spent 50 minutes today on this encounter before, during and after the visit, reviewing labs and records, evaluating and examining the patient, entering orders and documenting the visit. Full ACLS Signed:Sameer Teeple Yancey Flemings, APRN2/22/20231:32 PM

## 2022-02-12 NOTE — Plan of Care
Inpatient Physical Therapy Evaluation IP Adult PT Eval/Treat - 02/12/22 1000    Date of Visit / Treatment  Date of Visit / Treatment 02/12/22   Note Type Evaluation   Progress Report Due 02/26/22   End Time 1000    General Information  Pertinent History Of Current Problem Per chart,  65 y.o. y/o female with PMHx significant for rheumatoid arthritis on methotrexate, recent prolonged admission in [facility] and was discharged to [facility] 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 Hospital course complicated by persistent metabolic encephalopathy, wound incision abscess, continued poor nutrition requiring TPN, difficult family dynamics that ultimately involved Ethics and DVTs. Brought her by sister from SNF for evaluation for LTAC in [place].   Subjective Nonverbal throughout session   General Observations Supine in stretcher in ED, RA, NAD. RN cleared for session   Precautions/Limitations Fall Precautions    Prior Level of Functioning/Social History  Additional Comments Unclear PLOF, patient nonverbal    Pain/Comfort  Pain Comment (Pre/Post Treatment Pain) Patient yelling out in pain with light touch and PROM to BLE. RN aware    Patient Coping  Observed Emotional State withdrawn    Cognition  Overall Cognitive Status Impaired   Orientation Level Oriented to person   Patient will localize to name  Level of Consciousness lethargic   Following Commands Unable to follow any commands;Unable to answer questions   Personal Safety / Judgment Fall risk   Cognition Comments Nonverbal throughout session.  Able to turn head and localize gaze to name    Vision/ Hearing  Vision Assessment Results Unable to assess    Range of Motion  Range of Motion Examination WFL except   Range of Motion Comments UTA LE ROM 2/2 patient yelling out in pain with touch to BLE.    Manual Muscle Testing  Manual Muscle Testing Comments Deconditioned.  UTA BLE 2/2 pain    Musculoskeletal  LUE Muscle Strength Grading 2-->active movement with gravity eliminated   RUE Muscle Strength Grading 2-->active movement with gravity eliminated    Muscle Tone  Muscle Tone Testing Results No muscle tone deficits noted    Sensory Assessment  Sensory Tests Results No sensory impairment noted   Sensory Assessment Comments Yelling out in pain to LT.  Seems intact    Skin Assessment  Skin Assessment See Nursing Documentation   Skin Assessment Comments Pent with skin tear noted on L shin and brusiing to BLE ankles.    Balance  Balance Skills Training Comment Bed level only.  Impaired command following and inability to tolerate touch to LE    Bed Mobility  Bed Mobility Comments Max A x2 for postioning    Handoff Documentation  Handoff Patient on stretcher;Patient instructed to call nursing for mobility;Discussed with nursing    Endurance  Endurance Comments Poor    PT- AM-PAC - Basic Mobility Screen- How much help from another person do you currently need.....  Turning from your back to your side while in a a flat bed without using rails? 1 - Total - Requires total assistance or cannot do it at all.   Moving from lying on your back to sitting on the side of a flat bed without using bed rails? 1 - Total - Requires total assistance or cannot do it at all.   Moving to and from a bed to a chair (including a wheelchair)? 1 -  Total - Requires total assistance or cannot do it at all.   Standing up from a chair using your arms(e.g., wheelchair or bedside chair)? 1 - Total - Requires total assistance or cannot do it at all.   To walk in a hospital room? 1 - Total - Requires total assistance or cannot do it at all.   Climbing 3-5 steps with a railing? 1 - Total - Requires total assistance or cannot do it at all.   AMPAC Mobility Score 6   TARGET Highest Level of Mobility Mobility Level 2, Turn self in bed/bed activity/dependent transfer    ACTUAL Highest Level of Mobility Mobility Level 2, Turn self in bed/bed activity/ dependent transfer    Therapeutic Exercise  Therapeutic Exercise Comments PROM to UE, unable to tolerate PROM to LE    Therapeutic Functional Activity  Therapeutic Functional Activity Comments Postioning, alertness, education on PT role, POC    Clinical Impression  Initial Assessment Pt seen for evaluation this date.  Unable to tolerate much this date.  Patient assessed at bed level 2/2 lethargy, impaired command following and pain in BLE.  Able to open eyes to command and localizes gaze when her name is said or she is asked to track therapist.  Patient nonverbal throughout sesson.  Yelling out in pain with attempting assessment of BLE.  Able to tolerate UE ROM and strength assessment.  Max A x2 for repositioning on stretcher.  Pt demonstrates impairments in strength, coordination, comand following, mentation and actvity tolerance and will benefit from skilled PT. Recommend dc to STR when medically appropriate.   Criteria for Skilled Therapeutic Interventions Met yes;treatment indicated   Rehab Potential fair, will monitor progress closely    Patient/Family Stated Goals  Patient/Family Stated Goal(s) unable to state    Frequency/Equipment Recommendations  PT Frequency 3x per week   What day of week is next treatment expected? Friday   2/24  PT/PTA completing this assessment Timur Nibert P    PT Recommendations for Inpatient Admission  Other/Comments Bed level only, Ax2. PROM as tolerated    Planned Treatment / Interventions  Plan for Next Visit Progress as tolerated   General Treatment / Interventions Aerobic Capacity / Endurance Conditioning;Discharge Planning;Therapeutic Exercise   Training Treatment / Interventions Balance / Gait Training;Endurance Training;Functional Personnel officer / Interventions Patient Education / Training    PT Discharge Summary  Physical Therapy Disposition Recommendation Short Term Rehab     Problem: Physical Therapy GoalsGoal: Physical Therapy GoalsDescription: PT GOALS1. Patient will demonstrate active range of motion x 4 extremities in preparation for mobility2. Caregivers will be independent and safely implement a range of motion/positioning program to prevent loss of range of motion and skin integrity3. Patient will perform bed mobility with maximal assist4. Patient will tolerate sitting edge of bed >5 minutes with minimal assist5. Patient will perform transfers with moderate assist of 2 using rolling walker  Outcome: Initial problem identification Danielle [name], PT, DPTMHB: [phone]

## 2022-02-12 NOTE — Plan of Care
Plan of Care Overview/ Patient Status    Problem: Adult Inpatient Plan of CareGoal: Readiness for Transition of CareOutcome: Initial problem identification ADDENDUM:  CM met with sister [name], provided Medicaid check list and contact info.  She shares her dtr can assist.Currently Inpt status, workup ongoing, ?infections vs poss biopsy need for nodules.Discharge pending med readiness.  Per chart notes, pt is a 66 y.o. y/o female with PMHx significant for RA on methotrexate, recent prolonged admission in [facility] and was discharged to [facility] at [facility] (02/05/2022) ([place]). 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 Hospital course complicated by persistent metabolic encephalopathy, wound incision abscess, continued poor nutrition requiring TPN, difficult family dynamics that ultimately involved Ethics and DVTs. Brought her by sister from SNF for evaluation for LTC in [place].CM reviewed chart, s/w sister [name] and spoke with Malena Peer, SW at [place] [place] [phone].  -pt had long hospital course, approx 80 days-per Malena Peer, hospital covered the STR for several days while waiting for [employer] [facility] ins auth.  -[employer] ins Berkley Harvey was not approved, thus family had to either prv pay or pick up up from facility-[name] asking about long term care; Malena Peer instructed must to it for state pt will reside. -[name] and brother [name] drove pt from [facility] to [place] to [facility] ED.-CM explained that if [employer]ET[employer] did not approve STR in [place], it most likely will not approve here.  [name] stated understanding-CM briefly explained long term medicaid application process-Pt has no designated Scientist, research (medical).  Pt now not A/OPLAN:  -CM to meet with [name] to provide list of needed docs for Medicaid-Will also provide [employer] and [employer] info to assist.-Unfortunately, all of pt's financials were left in [place]. Family will need to arrange to either have it mailed, or to return to [place] to pick up.-Need SW input re: d/c planning, if needs POA/cons designee, etc. -CM unable to d/c without Medicaid application submitted with Case/reference #.Email sent to update CCMT Complex Care team. CM will cont to followCase Management Screening and Evaluation  Flowsheet Row Most Recent Value Case Management Screening: Chart review completed. If YES to any question below then proceed to CM Eval/Plan  Is there a change in their cognitive function No Do you anticipate a change in this patient's physicial function that will effect discharge needs? Yes Has there been a readmission within the last 30 days No Were there services prior to admission ( Examples: Assisted Living, HD, Homecare, Extended Care Facility, Methadone, SNF, Outpatient Infusion Center) Yes Negative/Positive Screen Positive Screening: Complete CM Evaluation and Plan Case Manager Attestation  I have reviewed the medical record and completed the above screen. CM staff will follow patient's progress and discuss the plan of care with the Treatment Team. Yes Case Management Evaluation and Plan  Arrived from prior to admission other (see comments)  [family drove her from [place] to [place], needs LTC] Do you have a caregiver? No Patient Requires Care Coordination Intervention Due To discharge planning needs/concerns, ability to make medical decisions in question, unable to return to prior living situation Prior to Hospitalization: Assistance Needed/DME being used --  [completely dependent] Documented Insurance Accurate No  [no coverage for LTC, needs LT  Medicaid] Any financial concerns related to anticipated discharge needs Yes Referred to: Other  [also to be given [employer] phone #] Patient's home address verified No Source of Clinical History  Patient's clinical history has been reviewed and source  of Information is: Civil Service fast streamer, Sibling(s) Case Manager Attestation  I have reviewed the medical record and completed the above evaluation with the following recommendations. Yes  Atha Starks, BSN, RN-ACMCare Management DeptYork [phone] [facility]

## 2022-02-12 NOTE — Research Notes
Kidney Action Team Recommendation Alert                                      THIS IS NOT Clemmie Krill MRN: [name] Date:  02/12/2022        Time: 7:32 AMThe Kidney Action Team is a group of clinicians designed to provide personalized recommendations for the diagnosis and treatment of patients with Acute Kidney Injury (AKI).We have received an alert within the past hour that this patient has developed AKI.  We have reviewed the patient?s chart and based on the patient?s current status and medical history, make the specific recommendations listed below.KIDNEY ACTION TEAM PERSONALIZED RECOMMENDATIONS FOR YOUR PATIENT WITH AKI:1. Diagnostic Recommendations:Consider a bladder scan/post-void residual measurementUrine protein creatinine ratio, urine albumin creatinine ratio2. Volume Recommendations:Consider empiric 1L of LR bolus as tolerated (CAUTION with fluids if concern for respiratory compromise and decompensated CHF or cirrhosis)3. Potassium Management Recommendations:There are no specific recommendations regarding potassium for this patient4. Acid/Base Management Recommendations:There are no specific recommendations regarding Acid/Base management for this patient.5. Please Consider Discontinuing the Following Medications:No recommendation to discontinue medications6. Medication Dose Adjustment Recommendations:No dose adjustments are necessary at this timeFor all patients with AKI, we recommend continued follow-up of serum creatinine and avoiding nephrotoxic exposures.Please note: This patient is part of a randomized clinical trial designed to test the efficacy of personalized recommendations for the diagnosis and treatment of Acute Kidney Injury.  You will NOT receive this recommendation note for all patients who develop AKI.The Kidney Action Team has not seen or examined this patient.  Our recommendations are based on chart review only.  It is your choice to follow these recommendations using your own clinical judgement and your examination of the patient and his/her medical history.  Note, the Kidney Action Team will not follow up on further test results or provide any further recommendations. If you need assistance with AKI management, please consider a formal renal consult. Medication recommendations are based on currently prescribed medications. Please reach out to your floor pharmacist if any further questions about starting renally cleared medications. For any feedback please send an email to [email or web address].

## 2022-02-12 NOTE — ED Notes
7:27 PM Verbal report received by RN Wynona Canes, care assumed. Pt admitted due to unspecified fever, dehydration, and failure to thrive. Presents confused, has colostomy, G-tub, on Eliquis  for bilateral DVTs, pending admission bed. 10:05 PM Pt medicated per MAR, VSS, pending admission bed.1:55 AM [place] ordered confirmed with MD Gopez for G-tube and IV contrast, oral contrast administered per G-tube. 4:28 AM Pt febrile 101.3, medicated per [facility], MD Gopez aware. Transport here, pt sent to [place].6:30 AM Pt incontinent of urine, incontinence care provided linens change, colostomy bag emptied. Straight cath performed for urine collection by this RN chaperoned by RN Leah, sterile technique maintained, urine sent. 7:15 AM Verbal report given to oncoming RN, care deferred.

## 2022-02-12 NOTE — H&P
[facility]	         [facility]	 lnternal Medicine History & PhysicalHistory provided by: EMR review, outside medical record(s) review and unobtainable from patient due to mental status and metabolic encephalopathy?History limited by: the condition of the patientPatient presents from: Skilled nursing facilityI have personally seen and examined the patient.I have reviewed patient's old records and summarized them below.I have seen and examined the patient on 02/11/2022 6:49 PM SUBJECTIVE: Chief Complaint: For admission to LTACHPI This is a 66 y.o. 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 Hospital course complicated by persistent metabolic encephalopathy - with extensive work up including labs, abdominal [place], head [place] and brain MRI, only revealing pericolic gutter and peripancreatic fluid collection (no drainable collection), folic deficiency - and was treated with ABx and folate supplementation. Given continued encephalopathy, Psych was consulted and was deemed without competency. Repeat abdominal [place] (12/18/21) showed an abscess at incision that was drained. Patient was on prolonged TPN, multiple discussions were made for PEG placement with family but no decisions were made. 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 Persistent encephalopathy and weakness prompted repeat imaging with repeat MRI Brain, MRI C-spine, thoracic and lumbar noting no new findings. (01/07/22) Ethics committed was involved given difficulty getting decisions for GOC with family. (01/15/22) patient developed fever, PICC line removed and work up showed UA with multiple species and BC x 1 with staph epidermidis. Repeated attempts to decide regarding PEG placement but recurrent family issues were encountered during admission with no clear decisions and at times difficult to get in touch with. Initial POC was brother [name], transferred to [place] due to Kohl's work schedule. Ethics was reinvolved given difficulty with decision making - (01/21/22) it was deemed that after thorough review of risks and benefits, if there is a continuous delay in the patient's family signing consent for PEG tube insertion and is affecting patient's care, it is reasonable to proceed with PEG placement without formal signed consent. IR was consulted and repeat [place] was done, revealed resolution of RLQ fluid collection but new bilateral DVTs - was placed on heparin drip. [name] gave consent 2/2 for PEG placement and patient underwent PEG placement (01/24/22) per surgery. Patient transitioned to Eliquis (01/26/22). (02/04/22) patient's PEG tube was noted to dislodged, replaced with 28F gastrostomy tube by IR. Was discharged to SNF (02/05/22) tolerating bolus tube feeds. Patient was brought here by sister for LTAC.ED Course: Received 132/89, HR 124, episode of fever 100.3 . On exam patient not in distress, tachycardic, soft abdomen with G-tube in place. Work up showed Na 145, Cl 110, K 7.4, low Cr 0.35, Ca 12.0, WBC 8.8, Hb 11.9. CXR without findings and EKG with SR. Patient received CTX 1g, LR 1L - repeated BMP with K 4.0ED Medications: LR 1L, NS 1L, CTX 1gMEDICAL HISTORY: PMH PSH No past medical history on file. No past surgical history on file. Social History Family History Social History Tobacco Use ? Smoking status: Not on file ? Smokeless tobacco: Not on file Substance Use Topics ? Alcohol use: Not on file  No family history on file. MEDICATIONS No current facility-administered medications on file prior to encounter. Current Outpatient Medications on File Prior to Encounter Medication Sig Dispense Refill ? acetaminophen (TYLENOL) 160 mg/5 mL elixir 500 mg by Per G Tube route every 6 (six) hours as needed.   ? acetaminophen (TYLENOL) 500 mg tablet Give 2 tablets via PEG-tube every 6 hours as needed   ? apixaban (ELIQUIS) 5 mg tablet 1 tablet (5 mg total) by Per G Tube route every 12 (twelve) hours.   ? chlorhexidine gluconate (PERIDEX) 0.12 % solution Use as directed 15 mLs in the mouth or throat 2 (two) times daily.   ? Elemental iron 60 mg (300 mg Ferrous sulfate) / 5 mL oral liquid Give 3.7 mL by g-tube two times daily   ? folic acid (FOLVITE) 1 mg tablet 1 tablet (1 mg total) by Per G Tube route daily.   ? insulin lispro (ADMELOG, HUMALOG) 100 unit/mL vial Inject under the skin 3 (three) times daily before meals. Use per sliding scale.   ? niacin 50 mg Immediate Release tablet 2 tablets (100 mg total) by Per G Tube route at bedtime.   ? pantoprazole (PROTONIX) 40 mg GrPS oral suspension 1 packet (40 mg total) by Per G Tube route daily.   ? psyllium (METAMUCIL) packet Give 1 packet via g-tube two times daily   ? pyridoxine, vitamin B6, (VITAMIN B-6) 100 mg tablet 1 tablet (100 mg total) by Per G Tube route daily.   ? rosuvastatin (CRESTOR) 10 mg tablet 1 tablet (10 mg total) by Per G Tube route nightly.   ? therapeutic multivitamin liquid 15 mLs by Per G Tube route daily.   ? thiamine (VITAMIN B1) 100 mg tablet 1 tablet (100 mg total) by Per G Tube route 2 (two) times daily.   ? insulin aspart U-100 (NOVOLOG) 100 unit/mL vial Inject 0-9 Units under the skin 4 (four) times daily before meals & at bedtime. (Patient not taking: Reported on 02/11/2022)    ALLERGIES No Known Allergies REVIEW OF SYSTEMS: Review of Systems Constitutional: Positive for fatigue and fever. Negative for appetite change and chills. HENT: Negative for congestion and sore throat.  Respiratory: Negative for cough, shortness of breath and wheezing.  Cardiovascular: Negative for chest pain, palpitations and leg swelling. Gastrointestinal: Negative for abdominal pain, blood in stool, constipation, diarrhea, nausea and vomiting. Endocrine: Negative for cold intolerance and heat intolerance. Genitourinary: Negative for difficulty urinating, dysuria, frequency and urgency. Musculoskeletal: Negative for arthralgias and back pain. Skin: Negative for rash. Neurological: Positive for weakness. Negative for light-headedness and headaches. Psychiatric/Behavioral: Negative for sleep disturbance.      Patient slow to respond, does not answer all questions. Able to follow commands, very weak looking OBJECTIVE: Vitals:Patient Vitals for the past 24 hrs: BP Temp Temp src Pulse Resp SpO2 02/11/22 1433 (!) 140/83 98.2 ?F (36.8 ?C) Oral (!) 103 16 98 % 02/11/22 1257 -- 98.8 ?F (37.1 ?C) Axillary -- -- -- 02/11/22 1000 122/73 -- -- (!) 99 15 99 % 02/11/22 0700 114/75 -- -- (!) 98 -- 99 % 02/11/22 0517 120/78 -- -- (!) 105 18 99 % 02/11/22 0402 119/83 (!) 100.3 ?F (37.9 ?C) Rectal (!) 112 18 99 % 02/11/22 0255 132/89 97 ?F (36.1 ?C) Temporal (!) 124 20 97 % PHYSICAL EXAM: Physical ExamConstitutional:     Appearance: She is ill-appearing.    Comments: Able to say name only, does not answer further questions. Very slow to respond, follows commands  but very weak HENT:    Head: Normocephalic and atraumatic.    Right Ear: External ear normal.    Left Ear: External ear normal.    Mouth/Throat:    Mouth: Mucous membranes are dry.    Pharynx: Oropharynx is clear. No oropharyngeal exudate or posterior oropharyngeal erythema. Eyes:    General:       Right eye: No discharge. Left eye: No discharge.    Conjunctiva/sclera: Conjunctivae normal.    Pupils: Pupils are equal, round, and reactive to light. Cardiovascular:    Rate and Rhythm: Normal rate and regular rhythm.    Pulses: Normal pulses.    Heart sounds: No murmur heard.  No friction rub. No gallop. Pulmonary:    Effort: No respiratory distress.    Breath sounds: No stridor. No wheezing or rhonchi. Abdominal:    General: Abdomen is flat. Bowel sounds are normal. There is no distension.    Palpations: There is no mass.    Tenderness: There is no abdominal tenderness. There is no guarding or rebound.    Comments: G tube present, ileostomy present with yellow green feces, wound bag present - no bleeding/pus/warmth or tenderness Musculoskeletal:       General: Deformity present. No swelling, tenderness or signs of injury. Normal range of motion.    Cervical back: Normal range of motion. No rigidity or tenderness.    Right lower leg: No edema.    Left lower leg: No edema.    Comments: Bilateral arms hands with beginning contractures and muscle wasting, bilateral legs without effort against gravity, beginning contractures Skin:   General: Skin is warm.    Capillary Refill: Capillary refill takes less than 2 seconds.    Coloration: Skin is not jaundiced.    Findings: No bruising, erythema or rash. Neurological:    General: No focal deficit present.    Mental Status: She is oriented to person, place, and time.    Cranial Nerves: No cranial nerve deficit.    Motor: No weakness.    Coordination: Coordination normal. Psychiatric:       Mood and Affect: Mood normal.       Thought Content: Thought content normal.  LABORATORY DATA/IMAGING: I have reviewed patient's laboratory results.LAB DATA: Recent Labs   02/21/230418 02/21/230424 02/21/230614 WBC 8.8  --   --  HGB 11.9  --   --  HCT 40.50 41.0 42.0 PLT 561*  --   --  MCV 88.4  -- --  NEUTROPHILS 74.1*  --   --  MONOCYTES 8.3  --   --  No results for input(s): LABPROT, INR, PTT, DDIMER, FIBRINOGEN in the last 72 hours.Recent Labs   02/21/230418 02/21/230424 02/21/230424 02/21/230432 02/21/230612 02/21/230614 02/21/230651 NA  --   --   --   --   --   --  145* K  --  7.4*  --   --   --  6.3* 4.0 CL 109*  --   --   --  109*  --  110* CO2 23  --   --   --   --   --  28 BUN 17  --   --   --  16  --  16 CREATININE 0.35* 0.5  --   --  0.30* 0.4* 0.42 GLU 138* 134*  --  129* 123* 124* 127* CALCIUM 12.0*  --   --   --  12.0*  --  12.1* MG 2.2  --   --   --   --   --   --  POCICA  --  5.60*   < >  --   --  5.50*  --  POCBUN  --  24*   < >  --   --  23*  --  POCNA  --  145   < >  --   --  145  --   < > = values in this interval not displayed. Recent Labs   02/21/230418 ALT 35 BILITOT <0.2 ALBUMIN 2.7* ALKPHOS 108 LIPASE 19 No results for input(s): CKTOTAL, CKMB, CKMBINDEX, TROPONINT, TROPONINI, BNP in the last 72 hours.No results for input(s): COLORU, SPECGRAV, PHUR, BLOODU, PROTEINUA, GLUCOSEU, KETONESU, BILIRUBINUR, UROBILINOGEN, LEUKOCYTESUR, NITRITE, BACTERIA, WBCUA, RBCUA, MUCUS, AMORPHOUS in the last 72 hours.Invalid input(s): EPIUAMICRO DATA:No results found for: Berdine Addison, LOWERRESPIRAIMAGING DATA:CXR (portable)Result Date: 2/21/2023XR CHEST PA OR AP INDICATION: rule out infection COMPARISON: No prior chest imaging. FINDINGS:   The cardiomediastinal silhouette is within normal limits for size. There is no confluent focal consolidation, pulmonary edema, pneumothorax or pleural effusion. Oblique bandlike density over the right mid to lower lung field likely reflects atelectasis. Gastrostomy tube is noted. There is no acute osseous abnormality.  No acute cardiothoracic abnormality. Report Initiated By:  Tessie Eke, RRA Reported And Signed By: Alberteen Sam, MD  [facility] Radiology and Biomedical Imaging I have personally reviewed the CXR. The CXR shows no acute findingsEKG:I have personally reviewed the EKG. The EKG shows SRASSESSMENT: Patient is a 67 y.o. y/o female with PMHx significant for rheumatoid arthritis on methotrexate, recent prolonged admission in [facility] and was [facility] to [facility] (02/05/2022) [facility]). 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 Hospital course complicated by persistent metabolic encephalopathy, wound incision abscess, continued poor nutrition requiring TPN, difficult family dynamics that ultimately involved Ethics and DVTs. Brought her by sister from SNF for evaluation for LTAC in [place].I have reviewed the patient's problem list and updated it as needed. PLAN: SIRS- Fever, tachycardia wo leukocytosis - no focus of infection. Complex history with abdominal surgery, hx UTI, no history of bacteremia. Unable to get urine sample due to dehydration per ED. 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 Remains persistent, POC [place] per chart review and was transferred to [facility]. Patient remains without capacity to make medical decisions. Difficult to complete neurologic exam but demonstrates chronic illness muscle wasting as well as likely neuropathy. Ethics has been involved with her care given difficulty with decision making coming from family- PT OT evaluation for disposition and therapy- Dietitian consult for TFHx of sigmoid colon stricture, ruptured diverticula and pneumoperitoneumS/p ex-lap, extended right hemicolectomy, ileostomy (11/17/2021)S/p G-tube insertion (01/24/2022)- Underwent emergent surgery for pneumoperitoneum secondary to ruptured diverticula with note of sigmoid colon stricture (Surgery by Dr. Darnell Level). 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  NOTIFICATIONS: PCP: Pcp, Does Not Have A NoneI have personally notified the patient's primary care provider of this admission. No Routed in EpicFamily was notified of this admission. No Notified over the phoneI have personally discussed the plan with the patient and/or family. NoSigned:[place]ami Latulippe Donnie Coffin, MDInternal Medicine - HospitalistAvailable on MHB2/21/20236:49 PM

## 2022-02-12 NOTE — ED Notes
3:32 PM received report from off going RN. Pt is resting comfortably at this time, breathing easy. Awaiting admission bed.7:20 PM report given to oncoming RN

## 2022-02-13 LAB — CBC WITHOUT DIFFERENTIAL
BKR WAM ANALYZER ANC: 10.8 x 1000/ÂµL — ABNORMAL HIGH (ref 2.00–7.60)
BKR WAM HEMATOCRIT (2 DEC): 31 % — ABNORMAL LOW (ref 35.00–45.00)
BKR WAM HEMOGLOBIN: 9.3 g/dL — ABNORMAL LOW (ref 11.7–15.5)
BKR WAM MCH (PG): 26.6 pg — ABNORMAL LOW (ref 27.0–33.0)
BKR WAM MCHC: 30 g/dL — ABNORMAL LOW (ref 31.0–36.0)
BKR WAM MCV: 88.6 fL (ref 80.0–100.0)
BKR WAM MPV: 10.5 fL (ref 8.0–12.0)
BKR WAM PLATELETS: 380 x1000/ÂµL (ref 150–420)
BKR WAM RDW-CV: 19.5 % — ABNORMAL HIGH (ref 11.0–15.0)
BKR WAM RED BLOOD CELL COUNT.: 3.5 M/ÂµL — ABNORMAL LOW (ref 4.00–6.00)
BKR WAM WHITE BLOOD CELL COUNT: 12.6 x1000/ÂµL — ABNORMAL HIGH (ref 4.0–11.0)

## 2022-02-13 LAB — BASIC METABOLIC PANEL
BKR ANION GAP: 10 (ref 7–17)
BKR BLOOD UREA NITROGEN: 19 mg/dL (ref 8–23)
BKR BUN / CREAT RATIO: 38 — ABNORMAL HIGH (ref 8.0–23.0)
BKR CALCIUM: 11.2 mg/dL — ABNORMAL HIGH (ref 8.8–10.2)
BKR CHLORIDE: 110 mmol/L — ABNORMAL HIGH (ref 98–107)
BKR CO2: 25 mmol/L (ref 20–30)
BKR CREATININE: 0.5 mg/dL (ref 0.40–1.30)
BKR EGFR, CREATININE (CKD-EPI 2021): >60 mL/min/{1.73_m2} (ref >=60)
BKR GLUCOSE: 113 mg/dL — ABNORMAL HIGH (ref 70–100)
BKR POTASSIUM: 3.1 mmol/L — ABNORMAL LOW (ref 3.3–5.3)
BKR SODIUM: 145 mmol/L — ABNORMAL HIGH (ref 136–144)

## 2022-02-13 LAB — VITAMIN B12: BKR VITAMIN B12: 1415 pg/mL — ABNORMAL HIGH (ref 232–1245)

## 2022-02-13 LAB — ZZZMRSA CULTURE     (BH GH LM YH): BKR MRSA MEDIA: POSITIVE — AB

## 2022-02-13 LAB — PARTIAL THROMBOPLASTIN TIME     (BH GH LMW Q YH)
BKR PARTIAL THROMBOPLASTIN TIME: 55.8 s — ABNORMAL HIGH (ref 23.0–31.4)
BKR PARTIAL THROMBOPLASTIN TIME: 82.3 s — ABNORMAL HIGH (ref 23.0–31.4)
BKR PARTIAL THROMBOPLASTIN TIME: 40.2 s — ABNORMAL HIGH (ref 23.0–31.4)

## 2022-02-13 LAB — TSH: BKR THYROID STIMULATING HORMONE: 1.17 u[IU]/mL

## 2022-02-13 MED ORDER — POTASSIUM BICARBONATE-CITRIC ACID 20 MEQ EFFERVESCENT TABLET
20 mEq | Freq: Once | ORAL | Status: CP
Start: 2022-02-13 — End: ?
  Administered 2022-02-13: 20:00:00 20 mEq via ORAL

## 2022-02-13 NOTE — ED Notes
12:09 PM Transport bringing pt up stairs to room assigned.

## 2022-02-13 NOTE — ED Notes
11:30 AM This RN assumed care at 1130. Per prior RN Joni Reining, pt needs US guided IV. ED bed charge aware. PTT due at 1230. Heparin running per MAR 14 units/kg/hr. Tube feeds running, pt redirectable, pt alert, but confused. WCTM

## 2022-02-13 NOTE — ED Notes
7:23 AM Report received from off going RN. Pt care assumed. Pt awaiting inpatient bed d/t dehydration, failure to thrive and bilateral DVTs. Ptt due at 0800. 9:23 AMPt medicated per MAR via G tube. Pt resting comfortably in stretcher. 11:00 PMPt visitor updated on pt status and plan of care. 4:00 PM Ptt collected. Heparin gtt started. 4:31 PMPt transported to [place] with this RN. 5:25 PMPTT 22.5 seconds. Heparin rate changed to 16 units/kg/hr.7:09 PMReport given to oncoming RN. Pt care transferred.

## 2022-02-13 NOTE — Progress Notes
[facility]	 [facility]	Daily Progress NoteHospital Medicine ServiceAttending Provider: Richarda Blade [place], MD [phone]:  EX09/EX09Hospital Day #2Subjective   Patient seen and examined on 02/13/2022, 12:06 PM.CC: Fever, unspecified fever causeInterim History: Still boarding in ED hallwayMS much improved, though with garbled speech.  Oriented only to nameHas no complaintsROS:ROS reviewed and otherwise negative.  Objective  Current Meds:[Held by provider] apixaban, 5 mg, Q12Hchlorhexidine gluconate, 15 mL, BIDElemental iron, 45 mg, BIDfamotidine, 20 mg, Dailyfolic acid, 1 mg, Dailyniacin, 100 mg, Nightlypiperacillin-tazobactam, 3.375 g, Q8Hpotassium bicarbonate-citric acid, 40 mEq, Oncepyridoxine (vitamin B6), 100 mg, Dailyrosuvastatin, 10 mg, Nightlysodium chloride, 3 mL, Q8Hthiamine, 100 mg, BID Vitals:Temp:  [98.1 ?F (36.7 ?C)-99.7 ?F (37.6 ?C)] 98.1 ?F (36.7 ?C)Pulse:  [90-118] 103Resp:  [16-20] 18BP: (90-104)/(57-75) 101/70SpO2:  [97 %-100 %] 98 %Device (Oxygen Therapy): room air  Labs:I have reviewed the patient's pertinent labs as resulted in the EMR.Lab Results Component Value Date  NA 145 (H) 02/13/2022  K 3.1 (L) 02/13/2022  CL 110 (H) 02/13/2022  CO2 25 02/13/2022  BUN 19 02/13/2022  CREATININE 0.50 02/13/2022  CALCIUM 11.2 (H) 02/13/2022 Lab Results Component Value Date  WBC 12.6 (H) 02/13/2022  HGB 9.3 (L) 02/13/2022  HCT 31.00 (L) 02/13/2022  HCT 42.0 02/11/2022  MCV 88.6 02/13/2022  PLT 380 02/13/2022 I/O's:Intake/Output Summary (Last 24 hours) at 02/13/2022 1200Last data filed at 02/13/2022 0819Gross per 24 hour Intake 100 ml Output -- Net 100 ml  Physical Exam Awake, alert.  Oriented only to nameNC/AT MMMNo JVDLungs clearRRRAbd soft, nontenderNo edemaNo rashesImaging:[place] Head wo IV ContrastResult Date: 2/22/2023CT HEAD WO IV CONTRAST performed on Date:  02/12/2022 5:30 PM INDICATION: Mental status change, unknown cause. COMPARISON: CTA head and neck 12/13/2018. TECHNIQUE: Serial [place] axial images were obtained from the skull base to the vertex without the administration of intravenous contrast.  Coronal and sagittal reconstructions are provided. Technical limitations: None FINDINGS: Patchy periventricular and subcortical white matter hypodensity is nonspecific, but likely related to chronic small vessel ischemia. Mildly dilated ventricle but not disproportionate to diffuse cerebral parenchymal volume loss, which has increased since the prior study of 12/13/2018, with bicaudate lateral ventricular diameter increasing from 30 to 40 mm. The callosal angle is 73 degrees. Sulci at the vertex are not markedly dilated but there is at the sylvian fissure and inferiorly are more moderately dilated. Gray-white matter differentiation is intact without large acute infarct. There is no intracranial hemorrhage. There is no mass effect, edema or midline shift. No extra-axial fluid collection is seen. Stable appearing ovoid shaped dural based extra-axial calcification along the left frontal convexity, may represent dural calcification versus small calcified meningioma, appears stable. No fractures or aggressive bone lesions. Ovoid shaped right orbital globe configuration similar to prior study. There is no significant paranasal sinus mucosal thickening. The mastoid air cells are clear. Since the prior exam, there has been a significant increase in ventricular size and sylvian fissures. In the appropriate clinical setting normal pressure hydrocephalus can be considered. Report Initiated By:  Consuela Mimes, MD Reported And Signed By: Cherre Blanc, MD  [facility] Radiology and Biomedical Imaging [place] Chest wo IV ContrastResult Date: 2/22/2023CT CHEST WO IV CONTRAST Date: 02/12/2022 5:29 PM INDICATION: Sepsis COMPARISON: Chest radiograph February 11, 2022. TECHNIQUE:  A volumetric  acquisition of the chest was obtained from the thoracic inlet to the upper abdomen without administration of intravenous contrast. Coronal and sagittal reformatted images were also provided. FINDINGS: Lungs/Airways/Pleura: No pleural effusions. Central airways are patent. Bibasilar subsegmental atelectasis. Patchy groundglass opacities in bilateral  lower lobes with 4 mm right lower lobe lung nodule ( image 249 series 3), findings may represent infectious/inflammatory reaction proper clinical setting. Mediastinum/Lymph nodes: Prominent subcentimeter mediastinal lymph nodes, for instance precarinal lymph node measures up to 0.9 cm in short axis and subcarinal lymph node measures up to 0.8 cm in short axis. Subcentimeter bilateral axillary lymph nodes and subpectoral lymph nodes.[place] Heart and Vessels: The heart appears normal in size. No pericardial effusion. Normal course and caliber of the aorta. Coronary artery calcifications. Upper Abdomen: Please see dedicated report of [place] abdomen pelvis performed concurrently for findings below the level of the diaphragm. Osseous structures and Soft Tissues: No aggressive bone lesions. Dextroconvex scoliosis. Patchy groundglass opacities in bilateral lower lobes with 4 mm right lower lobe lung nodule, findings may represent infectious/inflammatory etiology in a proper clinical setting. Short-term follow-up can be obtained for resolution. Otherwise consider follow-up chest [place] in 12 months for stability. Reported And Signed By: Antonieta Loveless, MD  [facility] Radiology and Biomedical Imaging  I reviewed lab results, microbiology, imaging, test results and EKG in formulating this patient's plan of care.   Assessment: 66 y.o. 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  Was deemed to lack capaciity, however required ethics consult as family difficult to contact for medical decision making, s/p PEG placement at that time.  Course further c/b b/l DVT, started on AC.  Now presented to [facility] for [facility] as family unable to care for her, found to be febrile/tachycardicPlan: #. SIRS- unclear source- UA negative- BC NGTD thus far- DFA negative- ?2/2 GGO on imaging however was never hypoxic and not producing sputum- ?2/2 malignancy given incidental imaging findings c/f peritoneal carcinomatosis.  IR consult for biopsy- defervesced on zosyn (day 2), spoke with ID who recommended to continue antibiotics for now#. Encephalopathy#. FTT- MS much improved today- had extensive OSH workup, have asked [facility] to obtain records- [place] head here negative for acute pathology- TSH/b12 ok#. DVT- on eliquis outpatient.  On hep gtt for now pending any procedural plans #. Diet: Diet Tube Feed No Tray#. PPx: Eliquis#. Medication Reconciliation: Completed previously#. Dispo: TBD pending course Updated sister ReneeMedical decision making for this patient was high.I spent 50 minutes today on this encounter before, during and after the visit, reviewing labs and records, evaluating and examining the patient, entering orders and documenting the visit. Full ACLS Signed:Armani Gawlik Yancey Flemings, APRN2/23/202312:06 PM

## 2022-02-13 NOTE — Plan of Care
[facility] [phone] to discuss obtaining imaging from patient's admission there.  Spoke with Celena in radiology there, they do not push to PACS outside of their health system.  She stated to fax a release form and a CD would be mailed.  Faxed release form to [phone] with return address of hospitalist business office.Ron Yancey Flemings MHB

## 2022-02-13 NOTE — Consults
[facility] Interventional RadiologyConsultation Information Interventional Radiology consultation requested by: Richarda Blade Temito*Reason for consultation: peritoneal nodules c/f carcinomatosis Source of information: Patient, medical record, and consulting providerHistory of Present Illness Blessing Slaby is a 66 y.o. 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 She presents to [facility] for placement as family is unable to care for her, found to be febrile and tachycardic. [place] AP imaging revealed peritoneal nodules, for which Interventional Radiology was requested to evaluate for biopsy.Past Medical History No past medical history on file. Past Surgical History No past surgical history on file. Family History No family history on file. Social History Social History Tobacco Use ? Smoking status: Not on file ? Smokeless tobacco: Not on file Substance Use Topics ? Alcohol use: Not on file  She has no history on file for drug use.  Inpatient Medications Outpatient Medications Current Facility-Administered Medications Medication Dose Route Frequency Provider Last Rate Last Admin ? [Held by provider] apixaban (ELIQUIS) tablet 5 mg  5 mg Per G Tube Q12H Littie Deeds, MD     ? chlorhexidine gluconate (PERIDEX) 0.12 % solution 15 mL  15 mL Mouth/Throat BID Dorisann Frames, MD   15 mL at 02/13/22 [id number] ? Elemental iron 60 mg (300 mg Ferrous sulfate) / 5 mL oral liquid 45 mg  45 mg Per G Tube BID Dorisann Frames, MD   45 mg at 02/13/22 8119 ? famotidine (PEPCID) suspension (8 mg/mL) 20 mg  20 mg Per G Tube Daily Dorisann Frames, MD     ? folic acid (FOLVITE) tablet 1 mg  1 mg Per G Tube Daily Dorisann Frames, MD   1 mg at 02/13/22 1478 ? niacin Immediate Release tablet 100 mg  100 mg Per G Tube Nightly Dorisann Frames, MD   100 mg at 02/12/22 2043 ? piperacillin-tazobactam (ZOSYN) 3.375 g in sodium chloride 0.9% 50 mL (mini-bag plus)  3.375 g Intravenous Q8H Dorisann Frames, MD   Stopped at 02/13/22 [phone] ? potassium bicarbonate-citric acid (EFFER-K, K-LYTE) effervescent tablet for oral solution 40 mEq  40 mEq Oral Once Veneta Penton, APRN     ? pyridoxine (vitamin B6) (B-6) tablet 100 mg  100 mg Per G Tube Daily Dorisann Frames, MD   100 mg at 02/13/22 2130 ? rosuvastatin (CRESTOR) tablet 10 mg  10 mg Per G Tube Nightly Dorisann Frames, MD   10 mg at 02/12/22 2043 ? sodium chloride 0.9 % flush 3 mL  3 mL IV Push Q8H Dorisann Frames, MD   3 mL at 02/13/22 0542 ? thiamine (VITAMIN B1) tablet 100 mg  100 mg Per G Tube BID Dorisann Frames, MD   100 mg at 02/13/22 8657 ? heparin 25,000 units/250 ml infusion - High Risk of Bleeding 14 Units/kg/hr (02/13/22 0649) acetaminophen, dextrose (GLUCOSE) oral liquid 15 g **OR** fruit juice **OR** skim milk, dextrose (GLUCOSE) oral liquid 30 g **OR** fruit juice, dextrose injection, dextrose injection, glucagon, sodium chloride Medications Prior to Admission Medication Sig Dispense Refill Last Dose ? acetaminophen (TYLENOL) 160 mg/5 mL elixir 500 mg by Per G Tube route every 6 (six) hours as needed.    ? acetaminophen (TYLENOL) 500 mg tablet Give 2  tablets via PEG-tube every 6 hours as needed    ? apixaban (ELIQUIS) 5 mg tablet 1 tablet (5 mg total) by Per G Tube route every 12 (twelve) hours.    ? chlorhexidine gluconate (PERIDEX) 0.12 % solution Use as directed 15 mLs in the mouth or throat 2 (two) times daily.    ? Elemental iron 60 mg (300 mg Ferrous sulfate) / 5 mL oral liquid Give 3.7 mL by g-tube two times daily    ? folic acid (FOLVITE) 1 mg tablet 1 tablet (1 mg total) by Per G Tube route daily.    ? insulin lispro (ADMELOG, HUMALOG) 100 unit/mL vial Inject under the skin 3 (three) times daily before meals. Use per sliding scale.    ? niacin 50 mg Immediate Release tablet 2 tablets (100 mg total) by Per G Tube route at bedtime.    ? pantoprazole (PROTONIX) 40 mg GrPS oral suspension 1 packet (40 mg total) by Per G Tube route daily.    ? psyllium (METAMUCIL) packet Give 1 packet via g-tube two times daily    ? pyridoxine, vitamin B6, (VITAMIN B-6) 100 mg tablet 1 tablet (100 mg total) by Per G Tube route daily.    ? rosuvastatin (CRESTOR) 10 mg tablet 1 tablet (10 mg total) by Per G Tube route nightly.    ? therapeutic multivitamin liquid 15 mLs by Per G Tube route daily.    ? thiamine (VITAMIN B1) 100 mg tablet 1 tablet (100 mg total) by Per G Tube route 2 (two) times daily.    ? insulin aspart U-100 (NOVOLOG) 100 unit/mL vial Inject 0-9 Units under the skin 4 (four) times daily before meals & at bedtime. (Patient not taking: Reported on 02/11/2022)   Not Taking  Allergies No Known Allergies Objective Data Physical ExamVitals:  02/13/22 0502 02/13/22 0643 02/13/22 0834 02/13/22 1045 BP: 95/65 (!) 90/57 90/60 101/70 Pulse: (!) 102 (!) 105 (!) 104 (!) 103 Resp: 18 16 20 18  Temp: 99.2 ?F (37.3 ?C) 98.1 ?F (36.7 ?C)   TempSrc: Oral Temporal   SpO2: 98% 97% 100% 98% Height:    5' 10 (1.778 m) Laboratory ResultsChemistry:Recent Labs Lab 02/21/230651 02/22/230512 02/23/230544 NA 145* 144 145* K 4.0 3.3 3.1* CL 110* 109* 110* CO2 28 24 25  BUN 16 16 19  CREATININE 0.42 0.72 0.50 Complete Blood Count:Recent Labs Lab 02/21/230418 02/21/230424 02/21/230614 02/22/230512 02/23/230544 WBC 8.8  --   --  11.0 12.6* HGB 11.9  --   --  9.4* 9.3* HCT 40.50   < > 42.0 31.80* 31.00* PLT 561*  --   -- 350 380  < > = values in this interval not displayed. Liver Function Tests:Recent Labs Lab 02/21/230418 ALT 35 ALKPHOS 108 BILITOT <0.2 Coagulation Studies:Recent Labs Lab 02/22/231555 02/22/232333 02/23/230544 PTT 22.5* 55.8* 82.3* Microbiology:Recent Labs Lab 02/22/230149 LABBLOO No Growth to Date - No Growth to Date Pertinent ImagingCT AP 2/22/23IMPRESSION:No collection in abdomen or pelvis. 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 Correlation with prior imaging is recommended.Assessment and Plan AssessmentCrystal Slay is a 66 y.o. 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 She presents to [facility] for placement as family is unable to care for her, found to be febrile and tachycardic. [place] AP imaging revealed peritoneal nodules, for which Interventional Radiology was requested  to evaluate for biopsy.Pt had several [place] AP imaging done at prior OSH admission. Case and imaging were reviewed with IR attending, Dr. Donavan Foil. Based on most current [place], nodules appear challenging to obtain sampling. However, would recommend to obtain [place] images from prior studies at OSH for further review.PLAN: -Once imaging is obtained please reconsult IR for reevaluation-If procedure is indicated, will need to determine point of contact person decision making (pt is confused)Thank you for involving Interventional Radiology in the care of your patient. Please text or call my Mobile Heart Beat with any questions or concerns.With urgent questions or concerns, please contact IR VW:[id number] (pagers)- [facility]: [phone]- [facility]: [phone]- [facility]: [phone] (phone, all locations)- [phone] [place], APRN 02/13/2022

## 2022-02-13 NOTE — Plan of Care
Danielle [name]					Location: [place] [id number] y.o., female				Attending: Richarda Blade Temito*	Admit Date: 02/11/2022			[id number] LOS: 2 days INITIAL NUTRITION ASSESSMENT Dietary Orders (From admission, onward)     Start     Ordered  02/13/22 0443  Diet Tube Feed No Tray  (Diet Tube Feed Panel)  DIET EFFECTIVE NOW      Question Answer Comment Adult Tube Feed Formulas: Jevity 1.5 ([facility],SRC)  Feeding Route: G-Tube  Continuous Rate ml/hr: Other(see comments) 78mL/hr Initiate Nutrition Management Protocol (Yes/No?) Yes - Initiate Protocol    02/13/22 0443    No Known AllergiesANTHROPOMETRICS:Height:   70Admit wt: no recent wt to assess. Current wt: unknown- pt not in bed with scale while in ED. Dosing Wt: 78 kg (based on hamwi +10%)BMI: 25  (based on estimated wt of 78kg) Additional wt information:Ht confirmed with pt: NO - pt not able to provide hx. Pt not in a bed with scale. Pt last known wt was 98.6 kg (12/13/2018). Suspect wt loss as pt does not appear to weight 98 kg. Use hamwi +10% for estimated wt of 78 kg as noted above for calculations until wt can be obtained. Wt Readings from Last 20 Encounters: 12/13/18 98.6 kg NUTRITION-FOCUSED PHYSICAL EXAM:-Muscle Depletion: Moderate            temples, clavicles, shoulders, interosseous, scapula, calf-Subcutaneous Fat Depletion: Moderate             orbital region, triceps, ribsESTIMATED NUTRITION REQUIREMENTS:Kcal/day: 1950-2340		(25 -30 kcal/kg)Protein/day: 94 - 117		(1.2-1.5gm/kg)Fluids/day: 1950-2340 mL/d (98mL/kcal) ONLY as medical and fluid status allows and only per team discretion. Needs based on: Dosing Wt (78 kg), ? Carcinomatosis, maintenance. NUTRITION ASSESSMENT: PT EMR reviewed for nutrition consult for tube feeds. Pt with hx of RA, recent prolonged admit to OSH and d/c'd to rehab on 2/15 in [place]. Pt had recent PEG placed on 2/3. Was tolerating bolus feeds PA. Pt in ED with benign abd and PEG in place. Ileostomy in place.Pt with hx of sigmoid colon stricture, reuptured diverticula and pneumoperitoneum, s/p exlap and hemi colectomy with ileostomy from 10/2021- Pt with new c/f peritoneal carcinomatosis. Noted to have bilateral DVT's.    Pt not able to provide hx. Pt was brought by sister from SNF to be evaluated for LTAC in [place].  Pt placed on standard formula oin ED. PTA pt was on low CHO option not available on [facility] formulary. No noted DM. Lbs reviewed and notable for high sodium , low potassium, and elevated BUN/Cr likely related to poor intake. Will adjust formula option to meet needs. Bolus feeds would be appropriate to continue as pt appears to have tolerated bolus feeds PTA.  Cultural/Religious/Ethnic NeeTIBds: UnknownNUTRITION DIAGNOSIS:Inadequate oral intake r/t changes in ability PTA aeb decision to place PEG feeding tube to support nutrition. Severe malnutrition r/t depletion aeb moderate muscle and fat depletion.INTERVENTIONS/RECOMMENDATIONS: Enteral/ Parenteral Nutrition?	Formula- PIVOT 1.5 @ 1 carton bolus TID (@9am ,1pm,5pm) with 1.5 cartons at 9pm to total 5.5 cartons daily)?	Provides: 1.3L, 1955kcal, 121gram Protein, 986 mL free water/d?	Additional Free water @ 960 mL/d as medical/ fluid status allows at Teams discretion. ?	Goal:  ?	Patient will advance/ tolerate goal rate TF/ TPN prior to next nutrition assessment?	Patient will receive >90% of goal volume TF/ TPN prior to next nutrition assessmentDischarge and Transfer of Nutrition Care to New Setting or Provider:?	 Nutrition Prescription- Discharge needs not determined at this time will continue to follow MONITORING / EVALUATION:Food/Nutrition-Related Outcomes:            food and nutrient intake/administration.Anthropometric Outcomes:  weightBiochemical Data, Medical Tests, and Procedure Outcomes: Labs (General chemistry, renal labs, Glucose).Nutrition-Focused Physical Finding Outcomes:            Physical appearance, muscle and fat wasting, swallow function, appetite.Electronically signed by Sherrilee Gilles, RD, February 23, 2023MHB- [phone] Please note: Nutrition is a consult only service on weekends and holidays.  Please enter a consult in EPIC if assistance is needed on a weekend or holiday or via MHB [phone] to contact the covering RD.

## 2022-02-13 NOTE — Other
-  CONSULT  REQUEST  DOCUMENTATION-[facility] C[facility] NOTE-Type of consult: [facility] Interventional Radiology -New Consult: [name] Danielle [name] /Location: EX09/EX09 / Brief Clinical Question: peritoneal nodules c/f carcinomatosis/Callback Cell Phone: [phone] / Please confirm receipt of this message by texting back ?OK?-2 Glena [place] Page sent to Interventional Radiology [facility] [phone] at 12:06 PM.-Amaurie Schreckengost Justice Deeds, PCT2/23/202312:06 [employer] [phone]

## 2022-02-13 NOTE — ED Notes
7:28 AM Report received from off-going RN. Pt admit for fever, dehydration and failure to thrive. Found to have b/l DVT - on heparin gtt. Next PTT due 12:30. One PIV in place - was informed by previous RN pt non-verbal at baseline. Pt has g-tube in place - receiving continuous feedings. NPO. Pt also has ostomy and ileostomy in place. Pending in patient bed. ?move c-side when bed available.

## 2022-02-13 NOTE — ED Notes
7:35 PM Report received from off-going RN. Admission for fever, dehydration and failure to thrive. Found to have bilateral DVTs, on heparin gtt at 16 units/kg/hr. One PIV in place. Nonverbal at baseline. G tube in place. Nonambulatory.8:45 PM Pt medicated per MAR via G-tube. Tube feed ordered unavailable, provider made aware. Pending updated orders.10:00 PM Zosyn initiated per [facility]. 11:10 PM Oral hygiene performed. Pt in NAD. Unlabored respirations noted. Continue to await IP bed assignment.11:25 PM Repeat PTT drawn and sent. Next PTT due at 5:25 AM. 12:03 AM PTT resulted as 55.8 seconds. No change to heparin GTT at this time. 2:10 AM Pt laying on stretcher with eyes closed. Unlabored respirations noted. In NAD. 4:49 AM Tube feed initiated at 40 mL/hr as ordered. 5:49 AM Wet brief changed. Ostomy bag emptied. Pt medicated per MAR. Blood work obtained and sent. 6:52 AM PTT resulted as 82.3. Heparin titrated to 14 units/kg/hr per MAR. 7:15 AM Report given to oncoming RN.

## 2022-02-13 NOTE — Other
Malnutrition IdentificationPt meets criteria for Severe Malnutrition based on the following identified criteria: Body GNF:AOZHY Illness Injury: Moderate Depletion  Muscle Mass: Acute Illness: Moderate Depletion     Electronically signed by Sherrilee Gilles, RD, February 13, 2022

## 2022-02-13 NOTE — ED Notes
7:38 PM Floor Handoff Telemetry: 	[]  Yes		[x]  No   Code Status:   [x]  Full		[]  DNR		[]  DNI		Other (specify):          Code Status: Full ACLS     Safety Precautions: []  None	[]  Sitter   []  Restraints	[]  Suicidal	[x]  Fall Risk	Other (specify):Mentation/Orientation:	 A&O (Self, person, place, time) x          	 Disoriented to:                         	 Deficits: []  Hearing impaired	[]  Blind  [x]  Nonverbal	 []  Mental retardationOxygenation Upon Admission: [x]  RA	[]  NC	[]  Venti  []  Simple Mask []  Other  	Baseline O2 Status? []  Yes	[x]  NoAmbulation: []  Independent	[]  Cane   []  Walker	[]  Wheelchair	[x]  Bedbound		[]  Hemiplegic	[]  Paraplegic	[]  QuadraplegicEliminiation: []  Independent	[]  Commode	[]  Bedpan/Urinal  []  Straight Cath []  Foley cath			[]  Urostomy	[]  Colostomy	[x] Incontinent  [] Other (specify):Diarrhea/Loose stool : []  1x within 24h  []  2x within 24h  []  3x within 24h  [x]  None 	C.Diff Order: 	[]  Ordered- needs to be collected             []  Collected-sent to lab             []  Resulted - Negative C.Diff             []  Resulted - Positive C.Diff[]  Not Ordered   [x]  N/ASkin Alteration: []  Pressure Injury []  Wound [x]  None []  Skin not assessed     Diet: []  Regular/No order placed          []  NPO		Other (specify): Diet Tube Feed No TrayIV Access: [x]  PIV   []  PICC    []  Port    [] Central line    []  A-line  Other (specify)Access Lines   Active PICC Line / PIV Line / Intraosseous Line / ART Line / Line / CVC Line / Implanted Port   Name Placement date Placement time Site Days  Periph IV 02/11/22 2200 median cubital(antecubital fossa), right over-the-needle catheter system 20 gauge 02/11/22  2200  median cubital(antecubital fossa), right  less than 1  Periph IV 02/12/22 0156 median cubital(antecubital fossa), right over-the-needle catheter system 18 gauge;2 in length 02/12/22  0156  median cubital(antecubital fossa), right  less than 1 IVF/GTT Running Upon ED Departure? []  No	    [x]  Yes (specify):   ? heparin 25,000 units/250 ml infusion - High Risk of Bleeding 12 Units/kg/hr (02/12/22 1559)  Outstanding Meds/Treatments/Tests: Patient Belongings: Are the belongings documented?          [x]  No	    []  YesIs someone taking belongings home?   [x]  No     []  Yes  Who? (specify)                                   ED RN and Contact number/MHB #:      Danna Hefty, RN [phone]

## 2022-02-13 NOTE — Other
-  CONSULT  REQUEST  DOCUMENTATION-CONNE[facility] C[facility] NOTE-Type of consult: [facility] Infectious Diseases -New Consult: [name] [name] /Location: EX09/EX09 / Brief Clinical Question: fever, origin unclear.  Defervesced on antibiotics, would appreciate recs for continuation/discontinuation/Callback Cell Phone: [phone] / Please confirm receipt of this message by texting back ?OK?-1 - Mobile Heartbeat message sent to [place], S at 12:07 PM. Received response at 12:06.-Finnley Lewis Justice Deeds, PCT2/23/202312:07 [employer] [phone]

## 2022-02-14 ENCOUNTER — Ambulatory Visit: Admit: 2022-02-14 | Payer: PRIVATE HEALTH INSURANCE | Primary: Internal Medicine

## 2022-02-14 LAB — HEPATITIS B SURFACE ANTIGEN     (BH GH L LMW YH): BKR HEPATITIS B SURFACE ANTIGEN: NEGATIVE

## 2022-02-14 LAB — HEPATITIS C AB WITH REFLEX TO HCV PCR
BKR HEPATITIS C ANTIBODY INITIAL RESULT: 4.86
BKR HEPATITIS C ANTIBODY: POSITIVE — AB

## 2022-02-14 LAB — CBC WITHOUT DIFFERENTIAL
BKR WAM ANALYZER ANC: 4.77 x 1000/ÂµL (ref 2.00–7.60)
BKR WAM HEMATOCRIT (2 DEC): 31.5 % — ABNORMAL LOW (ref 35.00–45.00)
BKR WAM HEMOGLOBIN: 9.2 g/dL — ABNORMAL LOW (ref 11.7–15.5)
BKR WAM MCH (PG): 26.3 pg — ABNORMAL LOW (ref 27.0–33.0)
BKR WAM MCHC: 29.2 g/dL — ABNORMAL LOW (ref 31.0–36.0)
BKR WAM MCV: 90 fL (ref 80.0–100.0)
BKR WAM MPV: 10 fL (ref 8.0–12.0)
BKR WAM PLATELETS: 321 x1000/ÂµL (ref 150–420)
BKR WAM RDW-CV: 19.5 % — ABNORMAL HIGH (ref 11.0–15.0)
BKR WAM RED BLOOD CELL COUNT.: 3.5 M/ÂµL — ABNORMAL LOW (ref 4.00–6.00)
BKR WAM WHITE BLOOD CELL COUNT: 7.1 x1000/ÂµL (ref 4.0–11.0)

## 2022-02-14 LAB — HEPATIC FUNCTION PANEL
BKR A/G RATIO: 0.4 — ABNORMAL LOW (ref 1.0–2.2)
BKR ALANINE AMINOTRANSFERASE (ALT): 189 U/L — ABNORMAL HIGH (ref 10–35)
BKR ALBUMIN: 2.2 g/dL — ABNORMAL LOW (ref 3.6–4.9)
BKR ALKALINE PHOSPHATASE: 103 U/L (ref 9–122)
BKR ASPARTATE AMINOTRANSFERASE (AST): 200 U/L — ABNORMAL HIGH (ref 10–35)
BKR AST/ALT RATIO: 1.1
BKR BILIRUBIN DIRECT: 0.2 mg/dL (ref <=0.3)
BKR BILIRUBIN TOTAL: 0.4 mg/dL (ref <=1.2)
BKR GLOBULIN: 5.3 g/dL — ABNORMAL HIGH (ref 2.3–3.5)
BKR PROTEIN TOTAL: 7.5 g/dL (ref 6.6–8.7)

## 2022-02-14 LAB — HEPATITIS A ANTIBODY, IGM: BKR HEPATITIS A IGM ANTIBODY: NEGATIVE

## 2022-02-14 LAB — BASIC METABOLIC PANEL
BKR ANION GAP: 9 (ref 7–17)
BKR BLOOD UREA NITROGEN: 23 mg/dL (ref 8–23)
BKR BUN / CREAT RATIO: 63.9 — ABNORMAL HIGH (ref 8.0–23.0)
BKR CALCIUM: 10.3 mg/dL — ABNORMAL HIGH (ref 8.8–10.2)
BKR CHLORIDE: 114 mmol/L — ABNORMAL HIGH (ref 98–107)
BKR CO2: 26 mmol/L (ref 20–30)
BKR CREATININE: 0.36 mg/dL — ABNORMAL LOW (ref 0.40–1.30)
BKR EGFR, CREATININE (CKD-EPI 2021): >60 mL/min/{1.73_m2} (ref >=60)
BKR GLUCOSE: 106 mg/dL — ABNORMAL HIGH (ref 70–100)
BKR POTASSIUM: 3.2 mmol/L — ABNORMAL LOW (ref 3.3–5.3)
BKR SODIUM: 149 mmol/L — ABNORMAL HIGH (ref 136–144)

## 2022-02-14 LAB — PARTIAL THROMBOPLASTIN TIME     (BH GH LMW Q YH)
BKR PARTIAL THROMBOPLASTIN TIME: 45.3 s — ABNORMAL HIGH (ref 23.0–31.4)
BKR PARTIAL THROMBOPLASTIN TIME: 57.1 s — ABNORMAL HIGH (ref 23.0–31.4)

## 2022-02-14 LAB — HEPATITIS B CORE ANTIBODY, IGM: BKR HEPATITIS B CORE IGM ANTIBODY: NEGATIVE

## 2022-02-14 LAB — MAGNESIUM: BKR MAGNESIUM: 2 mg/dL (ref 1.7–2.4)

## 2022-02-14 MED ORDER — DEXTROSE 5 % IN WATER (D5W) INTRAVENOUS SOLUTION
INTRAVENOUS | Status: AC
Start: 2022-02-14 — End: ?
  Administered 2022-02-14: 16:00:00 1000.000 mL/h via INTRAVENOUS

## 2022-02-14 MED ORDER — POTASSIUM BICARBONATE-CITRIC ACID 20 MEQ EFFERVESCENT TABLET
20 mEq | Freq: Once | ORAL | Status: CP
Start: 2022-02-14 — End: ?
  Administered 2022-02-14: 15:00:00 20 mEq via ORAL

## 2022-02-14 MED ORDER — POTASSIUM CHLORIDE ER 20 MEQ TABLET,EXTENDED RELEASE(PART/CRYST)
20 MEQ | Freq: Once | ORAL | Status: DC
Start: 2022-02-14 — End: 2022-02-14

## 2022-02-14 MED ORDER — PIPERACILLIN-TAZOBACTAM (ZOSYN) 4.5GM MBP
Freq: Four times a day (QID) | INTRAVENOUS | Status: DC
Start: 2022-02-14 — End: 2022-02-16
  Administered 2022-02-14 – 2022-02-16 (×7): 100.000 mL/h via INTRAVENOUS

## 2022-02-14 NOTE — Other
PHARMACY-ASSISTED MEDICATION REPORTPharmacist review of the best possible medication history obtained by the pharmacy medication history technician has been performed.  I have updated the home medication list and identified the following information that may be relevant to this admission.NOTES/RECOMMENDATIONSNone at this time        Prior to Admission Medications Medication Name Sig Taking? Patient Reported   acetaminophen (TYLENOL) 160 mg/5 mL elixirLast dose:  --  500 mg by Per G Tube route every 6 (six) hours as needed. Yes Yes   acetaminophen (TYLENOL) 500 mg tabletLast dose:  --  Give 2 tablets via PEG-tube every 6 hours as needed Yes Yes   apixaban (ELIQUIS) 5 mg tabletLast dose:  --  1 tablet (5 mg total) by Per G Tube route every 12 (twelve) hours. Yes Yes   chlorhexidine gluconate (PERIDEX) 0.12 % solutionLast dose:  --  Use as directed 15 mLs in the mouth or throat 2 (two) times daily. Yes Yes   Elemental iron 60 mg (300 mg Ferrous sulfate) / 5 mL oral liquidLast dose:  --  Give 3.7 mL by g-tube two times daily Yes Yes   folic acid (FOLVITE) 1 mg tabletLast dose:  --  1 tablet (1 mg total) by Per G Tube route daily. Yes Yes   insulin aspart U-100 (NOVOLOG) 100 unit/mL vialLast dose: Not TakingLast Medication Note: >> Bonnee Quin Feb 11, 2022  9:02 AMMedHx Tech(Jennifer Marlyce Huge, CPHT): FLAG FOR REMOVAL -  patient uses Insulin lispro, see other entry Entered by Valetta Fuller, CPHT Tue Feb 11, 2022 0902 Inject 0-9 Units under the skin 4 (four) times daily before meals & at bedtime.Patient not taking: Reported on 02/11/2022   Yes   insulin lispro (ADMELOG, HUMALOG) 100 unit/mL vialLast dose:  --  Inject under the skin 3 (three) times daily before meals. Use per sliding scale. Yes Yes   niacin 50 mg Immediate Release tabletLast dose:  --  2 tablets (100 mg total) by Per G Tube route at bedtime. Yes Yes pantoprazole (PROTONIX) 40 mg GrPS oral suspensionLast dose:  --  1 packet (40 mg total) by Per G Tube route daily. Yes Yes   psyllium (METAMUCIL) packetLast dose:  --  Give 1 packet via g-tube two times daily Yes Yes   pyridoxine, vitamin B6, (VITAMIN B-6) 100 mg tabletLast dose:  --  1 tablet (100 mg total) by Per G Tube route daily. Yes Yes   rosuvastatin (CRESTOR) 10 mg tabletLast dose:  --  1 tablet (10 mg total) by Per G Tube route nightly. Yes Yes   therapeutic multivitamin liquidLast dose:  --  15 mLs by Per G Tube route daily. Yes Yes   thiamine (VITAMIN B1) 100 mg tabletLast dose:  --  1 tablet (100 mg total) by Per G Tube route 2 (two) times daily. Yes Yes   Prior to admission medications last reviewed by Lawernce Keas on Tue Feb 11, 2022 0903 Thank Mauricia Area, PharmD2/24/20232:49 PMPhone: MHB

## 2022-02-14 NOTE — Plan of Care
Plan of Care Overview/ Patient Status    0700-1600 A&Ox1-2. Speaks minimal words. Pleasant. VSS on RA. Meds given via PEG tube. Bolus feeds given per MAR. PEG tube noted to be leaking moderate amount of TF., provider aware and surgery to bedside to adjust PEG tube. PEG tube leaking small amount now. Abdominal drainage bag in place w no output. Ileostomy putting out liquid brown stool, emptied for this shift. Purewick in place, urine is dark amber orange color. BIL PIVs. D5 infusing @ 165ml/hr. Hep gtt infusing @14u /kg/hr. Daily PTT. T&R Q2hr. Ax2 in bed. Safety maintained. Hourly rounding performed. See flowsheet.

## 2022-02-14 NOTE — Progress Notes
[facility]	 [facility]	Daily Progress NoteHospital Medicine ServiceAttending Provider: Charlaine Dalton, MD [phone]:  9831/9831-AHospital Day #3 Subjective: Patient seen and examined on 2/24/2023CC: Fever, unspecified fever causeInterim History: Patient had no concerns today. Was a bit confused. Did tell me she had no pain, no dyspnea, no abdominal discomfort. Objective: Vitals:Temp:  [97.3 ?F (36.3 ?C)-97.6 ?F (36.4 ?C)] 97.3 ?F (36.3 ?C)Pulse:  [75-105] 75Resp:  [18-20] 20BP: (100-107)/(66-71) 107/71SpO2:  [97 %-98 %] 98 %Device (Oxygen Therapy): room airRecent Labs   02/23/232130 02/24/230509 02/24/231144 GLU 158* 106* 178* I/O's:Intake/Output Summary (Last 24 hours) at 02/14/2022 1332Last data filed at 02/14/2022 0600Gross per 24 hour Intake 820 ml Output 500 ml Net 320 ml Physical Exam GEN: Lying in bed, NADHEENT: MMDryCV: S1 and S2PULM: CTABABD: Soft, nd, nt, two ostomies in place, PEG tube with some drainage and skin excoriation around itEXTREM: LLE Edema 1+NEURO: CN2-12 grossly intactPSYCH: AAOx1 (person only)Diagnostics:I have reviewed new labs and imaging over the last 24 hours with the following notable findings:Complete Blood CountResults in Past 7 DaysResult Component Current Result Hematocrit 31.50 (L) (02/14/2022) Hemoglobin 9.2 (L) (02/14/2022) MCH 26.3 (L) (02/14/2022) MCHC 29.2 (L) (02/14/2022) MCV 90.0 (02/14/2022) MPV 10.0 (02/14/2022) Platelets 321 (02/14/2022) RBC 3.50 (L) (02/14/2022) WBC 7.1 (02/14/2022)   Chemistry    Component Value Date/Time  NA 149 (H) 02/14/2022 0509  K 3.2 (L) 02/14/2022 0509  CL 114 (H) 02/14/2022 0509  CO2 26 02/14/2022 0509  BUN 23 02/14/2022 0509  CREATININE 0.36 (L) 02/14/2022 0509  GLU 178 (H) 02/14/2022 1144  GLU 106 (H) 02/14/2022 0509  PROT 7.5 02/14/2022 0509 Component Value Date/Time  CALCIUM 10.3 (H) 02/14/2022 0509  ALKPHOS 103 02/14/2022 0509  AST 200 (H) 02/14/2022 0509  ALT 189 (H) 02/14/2022 0509  BILITOT 0.4 02/14/2022 0509  ALBUMIN 2.2 (L) 02/14/2022 0509  Assessment: This is a 66 y.o. lady with PMHx of RA on MTX, recent prolonged hospitalization in [place] for perforated diverticulitis s/p ex lap/R hemicolectomy/end ileostomy, persistent encephalopathy with multiple stable MRIs, surgical incision abscess s/p drainage, s/p G Tube placement, DVT on AC who presents to [facility] with persistent encephalopathy, fevers, and leukocytosis. She is now afebrile after 2 days of zosyn and her white count has normalized. I am not sure where/If she was infected. We have asked ID to help determine any sources. Her G tube was leaking and I have asked surgery to help with this. She is my hypernatremic today. This could be a cause for her encephalopathy. I am treating this now. If this normalizes and she remains confused, then I will ask neurology to weigh in.  Plan: # Fever, Leukocytosis- Appreciate ID help- c/w zosyn for now- f/u BC# Elevated liver chemistries- RUQ [place]- Acute hepatitis panel- Could be DILI from zosyn but I think this would normally effect ALP. Given my uncertainty will consult hepatology- hepatology consult# Hypernatremia- D5W 100 ml/hr for 10 hours- recheck BMP in early evening and recalculate free water deficit# G-tube leaking- Appreciate surgery help- they will secure the bumper- should be OK to use once they secure bumper# Encephalopathy- correct hypernatremia- treat possible infection for now pending ID consult- consult neurology if does not improve with electrolyte correction# DVT- on heparin gtt for now pending possible IR intervention- can consider switching to lovenox tomorrow if no impending IR procedure- Switch back to DOAC at discharge# ?Peritoneal carcinomatosis- Appreciate IR Consult- [facility] No TrayVTE PPx: Heparin InfusionMobility: Highest Level of mobility - ACTUAL: Mobillty Level  1, Lying in bed , AM PAC < 6Dispo: MedicineSigned:Kathleen Likins, MD2/24/2023

## 2022-02-14 NOTE — Plan of Care
Adult Speech and Language PathologyYale Swallow Protocol (Aspiration Risk Screening)02/14/2022 Patient Name:  Danielle MaloneMR#:  [id number] Date of Birth:  June 14, 1957Therapist:  Doloris Hall, SLP SLP IP Adult General Information - 02/14/22 1518    General Information  Subjective You better move that thing or I'll move you.   Medical Diagnosis Per chart: 66 y.o. lady with PMHx of RA on MTX, recent prolonged hospitalization in [place] for perforated diverticulitis s/p ex lap/R hemicolectomy/end ileostomy, persistent encephalopathy with multiple stable MRIs, surgical incision abscess s/p drainage, s/p G Tube placement, DVT on AC who presents to [facility] with persistent encephalopathy, fevers, and leukocytosis. She is now afebrile after 2 days of zosyn and her white count has normalized. I am not sure where/If she was infected. We have asked ID to help determine any sources. Her G tube was leaking and I have asked surgery to help with this. She is my hypernatremic today. This could be a cause for her encephalopathy. I am treating this now. If this normalizes and she remains confused, then I will ask neurology to weigh in.   Pertinent History of Current Problem No documented history of swallow evaluation/ dysphagia found in EMR.   Primary Concern Swallow function.   Prior Level of Functioning No documented history of swallow evaluation/ dysphagia found in EMR. Patient w/ PEG from outside hospital.      SLP IP Adult Pain/Comfort - 02/14/22 1518    Pain/Comfort  Pain Comment (Pre/Post Treatment Pain) no c/o pain   Pain Rating at Rest 0/10 - no pain      SLP IP Adult Oral/Motor Assessment - 02/14/22 1518    Oral/Motor Function  Labial Function WFL - Within Functional Limits   Lingual Function WFL - Within Functional Limits   Facial  WFL - Within Functional Limits   Dentition Edentulous   Overall Oral Motor Function Comments Adequate for PO intake.      SLP IP Adult Cognition - 02/14/22 1518    Cognition / Mental Functioning       Cognitive Status Comments Patient alert.      SLP IP Adult 3 Ounce Water Challenge - 02/14/22 1518    Three Ounce Water Swallow Challenge  Reason for Study concern for aspiration risk   Patient's current diet NPO   w/ PEG  Water Swallow Challenge was Performed Patient failed the challenge   Continue Patient as NPO yes, until objective swallow evaluation completed   Aspiration Precautions yes   Overall Three Ounce Water Swallow Challenge Comments Patient failed the [employer] Protocol/ 3 oz water challenge as evidenced by her inability to drink 3 oz of water continuously without stopping, coughing, throat clearing, or demonstrating any overt s/sx of dysphagia.SLP to proceed with instrumental swallow evaluation (FEES). Please see dedicated FEES note to follow.      SLP IP Adult Recommendations - 02/14/22 1518    Recommendations  SLP Therapy Frequency TBD pending FEES. Please see dedicated note to follow.     SLP IP Adult Inpatient Recommendations - 02/14/22 1518    SLP Recommendations for Inpatient Admission  Swallow Recommendations TBD pending FEES. Please see dedicated note to follow.     SLP IP Adult Discharge Summary - 02/14/22 1518    Discharge Summary  Disposition Recommendation(s) To be determined pending progress     Doloris Hall, M.S., CF-SLPMHB: [phone]/24/2023

## 2022-02-14 NOTE — Other
-  CONSULT  REQUEST  DOCUMENTATION-[facility] NOTE-Type of consult: [facility] General Surgery -New Consult: [name] Danielle [name] /Location: 9831/9831-A / Brief Clinical Question: Patient with G Tube placed by surgery at outside facility, now leaking, please evaluate/Callback Cell Phone: [phone] / Please confirm receipt of this message by texting back ?OK?-2 Glena [place] Page sent to General Surgery 2199 at 11:13 AM.-Dilraj Killgore Dick2/24/202311:13 [facility] [phone]

## 2022-02-14 NOTE — Plan of Care
Adult Speech and Language PathologySwallow Evaluation - Initial FEES Attempt2/24/2023 Patient Name:  Danielle MaloneMR#:  [name] Date of Birth:  [birth year]Therapist:  Doloris Hall, SLP SLP IP Adult General Information - 02/14/22 1518    General Information  Subjective You better move that thing or I'll move you.   Medical Diagnosis Per chart: 66 y.o. lady with PMHx of RA on MTX, recent prolonged hospitalization in [place] for perforated diverticulitis s/p ex lap/R hemicolectomy/end ileostomy, persistent encephalopathy with multiple stable MRIs, surgical incision abscess s/p drainage, s/p G Tube placement, DVT on AC who presents to [facility] with persistent encephalopathy, fevers, and leukocytosis. She is now afebrile after 2 days of zosyn and her white count has normalized. I am not sure where/If she was infected. We have asked ID to help determine any sources. Her G tube was leaking and I have asked surgery to help with this. She is my hypernatremic today. This could be a cause for her encephalopathy. I am treating this now. If this normalizes and she remains confused, then I will ask neurology to weigh in.   Pertinent History of Current Problem No documented history of swallow evaluation/ dysphagia found in EMR.   Primary Concern Swallow function.   Prior Level of Functioning No documented history of swallow evaluation/ dysphagia found in EMR. Patient w/ PEG from outside hospital.      SLP IP Adult Pain/Comfort - 02/14/22 1518    Pain/Comfort  Pain Comment (Pre/Post Treatment Pain) no c/o pain   Pain Rating at Rest 0/10 - no pain      SLP IP Adult Oral/Motor Assessment - 02/14/22 1518    Oral/Motor Function  Labial Function WFL - Within Functional Limits   Lingual Function WFL - Within Functional Limits   Facial  WFL - Within Functional Limits   Dentition Edentulous   Overall Oral Motor Function Comments Adequate for PO intake. SLP IP Adult Cognition - 02/14/22 1518    Cognition / Mental Functioning       Cognitive Status Comments Patient alert.      SLP Adult FEES - 02/14/22 1518    Fiberoptic Endoscopic Evaluation of Swallowing  Type of Study Initial FEES   Reason for Study concern for aspiration risk;failed [place] Swallow Protocol   Patients Current Diet NPO   w/ PEG  FEES was performed At bedside;With patient sitting upright   Scope was inserted in the nares Left   Serial number of the Scope used [id number]   Pooled secretions noted In vallecula;In pyriform sinuses   Other Observations Yellow, thick secretions noted upon placement of scope.   Patient should be NPO Yes, with strict oral care   Medication Administration via feeding tube   Aspiration Precautions yes   IMPRESSIONS: Patient seen for initial instrumental swallow evaluation (FEES). Patient initially agreeable to study. Sucessfully passed and placed scope. Anatomy visualized. Thick, yellow secretions noted in pyriform sinuses, upon posterior pharyngeal wall, and in the vallecula. However, once the scope was placed in position, patient refusing trials of PO despite max education/ encouragement from this clinician. Patient made several attempts to dislodge scope and requested for scope to be moved. Scope removed. Study terminated. No trials of PO observed. Team made aware. See recommendations below.      SLP IP Adult Recommendations - 02/14/22 1518    Recommendations  SLP Therapy Frequency Evaluation pending      SLP IP Adult Inpatient Recommendations - 02/14/22 1518    SLP Recommendations for Inpatient Admission  Swallow Recommendations REC:NPO.  Nutrition/ meds via alternate route. Strict daily oral care. Team can consider ordering a Modified Barium Swallow (MBS) Study Panel to allow SLP to coordinate with radiology for repeat instrumental swallow evaluation given patient's PO acceptance prior to scope placement.MBS will likely occur Monday vs Tuesday of next week.     SLP IP Adult Discharge Summary - 02/14/22 1518    Discharge Summary  Disposition Recommendation(s) To be determined pending progress     Doloris Hall, M.S., CF-SLPMHB: [phone]/24/2023

## 2022-02-14 NOTE — Plan of Care
Plan of Care Overview/ Patient Status    15:00 - 19:00Vital signs stable. No complaints or issues on shift. Remained on Heparin continuous infusion and tolerated well. IV Abx and scheduled medications given as ordered. See flow sheet for full assessment.

## 2022-02-14 NOTE — Plan of Care
Plan of Care Overview/ Patient Status    1215-1900AO x 2, disoriented to time and situation, calm, cooperative. Skin C/D/I. Denies SOB, N/V/D, DOA. NPO.  Incontinent bowel and bladder. Turned and repositioned Q2h. X 2 assist with bed mobility. Safety maintained, call light within reach, and bed locked. Continue to follow POC. Heparin drip.

## 2022-02-14 NOTE — Other
-  CONSULT  REQUEST  DOCUMENTATION-[facility] NOTE-Type of consult: [facility] Gastro[facility]ology Liver -New Consult: [name] [name] /Location: 9831/9831-A / Brief Clinical Question: Rising liver chemistries, ?DILI vs other causes?, Hepatitis panel and RUQ [place] pending but [place] Showed unremarkable hepatobiliary exam, please help determine cause of liver chemistry abnormalities/Callback Cell Phone: [phone] / Please confirm receipt of this message by texting back ?OK?-1 - Mobile Heartbeat message sent to Tye [place] at 1:49 PM. Received response at 1349.-Clara Herbison Dick2/24/20231:49 [facility] [phone]

## 2022-02-14 NOTE — Plan of Care
Plan of Care Overview/ Patient Status    SOCIAL WORK ASSESSMENTPatient Name: Danielle [name] Record Number: [name] Date of Birth: [birth year]Medical Social Work Assessment Adult  Flowsheet Row Most Recent Value Rendered Accommodations (Leave blank if none rendered or patient/family supplied their own hearing devices/glasses)  Other language interpreter used (non-ASL)? No Admission Information  Document Type Clinical Assessment - Unable to Assess Reason for Inability to Assess --  [Patient mental status is altered] (For Inpatient/ED Only) Prior psychosocial assessment has been documented within this hospitalization No (For Inpatient/ED Only) Prior psychosocial assessment has been documented within 30 days of this hospitalization No Reason for Current Social Work Involvement Support/Coping, Self-care/decision concerns, Museum/gallery curator of Information Patient Record Reviewed Yes Level of Care Inpatient Assessment has been completed within 30 days of this encounter  (For Inpatient/ED Only) Prior psychosocial assessment has been documented within 30 days of this hospitalization No Abuse Screen (yes response referral indicated)  Do you Feel That You Are Treated Well By Your Partner/Spouse/Family Member/Caregiver/Employer?  yes Social Determinants of Health  Think about the place you live. Do you have problems with any of the following? None Medically Ready for Discharge  Is this patient medically ready for discharge? No Formulation: Recommendation(s) and Intervention(s) (including for discharge to occur)  Psychosocial issues requiring intervention T-19 application discussion - will need LTC placement Psychosocial interventions 15 minutes spent face to face with Danielle [name] in attempts to complete clinical assessment. Consult placed ?T-19 application discussion - will need LTC placement?. Chart reviewed.  I introduced myself and role. During this intervention I utilized active listening and psychoeducation.   [name] is a 66 year old, African American female, divorced, no children, and per chart reviewed [place] by sister who is unable to care for her.   At the time of this interview, Danielle [name] was not oriented to situation, time, and place. When asked if I could reach out to family for collateral contact she noted, ?I don?t want you to call them?.   In regards to consult placed, patient will need a power of attorney and/or conservator to initiate T-19 application and subsequent services/needs. Danielle [name] would also benefit from a capacity evaluation. Social worker will remain available for support and advocacy. [facility] team Specific referrals to enhance community supports (include existing and new resources) Capacity evaluation, Management consultant, T-19 application submission Handoff Required? No Next Steps/Plan (including hand-off): Social work will remain available to assist medical team once decision is made regarding next steps including capacity evaluation and aftercare needs including T-19 application process. Signature: Danielle [name], Danielle [name], [place] Contact Information: [phone]

## 2022-02-14 NOTE — Plan of Care
Plan of Care Overview/ Patient Status    Problem: Adult Inpatient Plan of CareGoal: Readiness for Transition of CareOutcome: Interventions implemented as appropriate Pt is a social admit, brought to [facility] by her family from [facility]. Pt will require [place] Medicaid for LTC placement. SW consult entered. [place] team will follow pt's progress until LTC location is secured. Elinor Parkinson, RN, MSNCare ManagerMHB: [phone]

## 2022-02-14 NOTE — Other
INITIAL Infectious Diseases ConsultHistory obtained from chart review.Referred by Charlaine Dalton, MDAttending Provider: Charlaine Dalton, MDHistory of Present Charlynn Court 66 y.o. woman with a PMHx of rheumatoid arthritis on methotrexate, sigmoid diverticulitis, c/b perforation and pneumoperitoneum/sigmoid colon stricture, need for TPN,  s/p ex lap/right hemicolectomy/end ileostomy, hospitalized at OSH from 10/2021-01/2022, hospital course c/b encephalopathy, ostomy leak, abscess at the level of ostomy and at the level of umbilicus, s/p  b/l femoral DVT (on [facility]), FTT s/p G tube placement, who presented to [facility] for fever.As per chart review, the abscess/collection was drained and she was treated with antibiotics.History is limited as her speech is incoherent.Since arrival here, she had intermittent fevers, has been acetaminophenOn arrival, she had normal WBC, but had elevated WBC of 12.6 on 02/12/2022.Pip-tazo was started on 02/11/2022.She received one dose of ceftriaxone on 02/11/2022.[place] chest significant for bi basal patchy GGO , [place] AP negative for any abscess or collection, but notable for 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 ABxMEDICATIONS - reviewedOutpatient antimicrobials:Inpatient antimicrobials:Ceftriaxone 2/21Pip-tazo 2/21-presentCurrent Facility-Administered Medications Medication Dose Route Frequency Provider Last Rate Last Admin ? [Held by provider] apixaban (ELIQUIS) tablet 5 mg  5 mg Per G Tube Q12H Littie Deeds, MD     ? chlorhexidine gluconate (PERIDEX) 0.12 % solution 15 mL  15 mL Mouth/Throat BID Dorisann Frames, MD   15 mL at 02/14/22 4098 ? Elemental iron 60 mg (300 mg Ferrous sulfate) / 5 mL oral liquid 45 mg  45 mg Per G Tube BID Dorisann Frames, MD   45 mg at 02/14/22 0827 ? famotidine (PEPCID) suspension (8 mg/mL) 20 mg  20 mg Per G Tube Daily Dorisann Frames, MD     ? folic acid (FOLVITE) tablet 1 mg  1 mg Per G Tube Daily Dorisann Frames, MD   1 mg at 02/14/22 1191 ? niacin Immediate Release tablet 100 mg  100 mg Per G Tube Nightly Dorisann Frames, MD   100 mg at 02/13/22 2201 ? piperacillin-tazobactam (ZOSYN) 4.5 g in sodium chloride 0.9% 100 mL (mini-bag plus)  4.5 g Intravenous Q6H Charlaine Dalton, MD     ? potassium chloride SA (K-DUR,KLOR-CON M) 24 hr tablet 40 mEq  40 mEq Oral Once Charlaine Dalton, MD     ? pyridoxine (vitamin B6) (B-6) tablet 100 mg  100 mg Per G Tube Daily Dorisann Frames, MD   100 mg at 02/14/22 4782 ? rosuvastatin (CRESTOR) tablet 10 mg  10 mg Per G Tube Nightly Dorisann Frames, MD   10 mg at 02/13/22 2201 ? sodium chloride 0.9 % flush 3 mL  3 mL IV Push Q8H Dorisann Frames, MD   3 mL at 02/13/22 0542 ? thiamine (VITAMIN B1) tablet 100 mg  100 mg Per G Tube BID Dorisann Frames, MD   100 mg at 02/14/22 9562  ALLERGIESNo Known AllergiesReview of Systems 12 point ROS reviewed with patient and is negative except as noted in HPI. Physical ExamVitals:  02/14/22 0755 BP: 107/71 Pulse: 75 Resp: 20 Temp: 97.3 ?F (36.3 ?C) Temp (24hrs), Avg:98 ?F (36.7 ?C), Min:97.3 ?F (36.3 ?C), Max:99.1 ?F (37.3 ?C)General:  Elderly lady who looks older than stated ageHEENT: poor dental hygiene, lower anterior teeth absentHeart: S1S2 heardChest: decreased air entry at basesAbdomen:  PEG tube and ileostomy noted, mid line scar noted, no tenderness or erythema around the scarLE: no edemaHer speech is difficult to understand, she is able to answer yes or no to simple questions, but unable to provide further history.No focal motor weakness, she is able to move all 4 extremities symmetrically.LABORATORY DATARecent Labs Lab 02/21/230418 02/21/230424 02/22/230512 02/22/231703 02/23/230544 02/23/231650 02/23/232130 02/24/230509 NA  --    < > 144  --  145*  --   --  149* K  --    < > 3.3  --  3.1*  --   --  3.2* CL 109*   < > 109*  --  110*  --   --  114* CO2 23   < > 24  --  25  --   --  26 BUN 17   < > 16  --  19  --   --  23 CREATININE 0.35*   < > 0.72  --  0.50  --   --  0.36* GLU 138*   < > 97   < > 113* 97 158* 106* ALBUMIN 2.7*  --   --   --   --   --   --   --   < > = values in this interval not displayed. Estimated Creatinine Clearance: 84 mL/min (A) (by C-G formula based on SCr of 0.36 mg/dL (L)).Recent Labs Lab 02/22/230512 02/23/230544 02/24/230509 WBC 11.0 12.6* 7.1 HGB 9.4* 9.3* 9.2* HCT 31.80* 31.00* 31.50* MCV 89.8 88.6 90.0 PLT 350 380 321 Recent Labs Lab 02/21/230418 NEUTROPHILS 74.1* Recent Labs Lab 02/21/230418 ALT 35 ALKPHOS 108 BILITOT <0.2 Glucose MonitoringRecent Labs Lab 02/22/231955 02/23/230404 02/23/230544 02/23/231650 02/23/232130 02/24/230509 GLU 91 88 113* 97 158* 106* Microbiology2/21/2023, CoV-2: negative2/21/2023, Respiratory viral panel: negative2/22/2023, blood culture: NGTD2/22/2023, MRSA culture nares: PositiveRadiology2/21/2023, chest x ray: No acute cardiothoracic abnormality2/21/2023, [place] AP: No collection in abdomen or pelvis. 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 Correlation with prior imaging is recommended.02/12/2022, [place] chest: Patchy groundglass opacities in bilateral lower lobes with 4 mm right lower lobe lung nodule, findings may represent infectious/inflammatory etiology in a proper clinical setting. Short-term follow-up can be obtained for resolution. Otherwise consider follow-up chest [place] in 12 months for stability.02/12/2022, [place] head: Since the prior exam, there has been a significant increase in ventricular size and sylvian fissures. In the appropriate clinical setting normal pressure hydrocephalus can be considered.Assessment 66 y.o. woman with a PMHx of rheumatoid arthritis, on methotrexate who was recently hospitalized at an OSH for prolonged period of time in the setting of perforated diverticulitis needing right hemicolectomy and end ileostomy, sub cutaneous abscess, s/p drainage, needing TPN and then PEG tube placement, bilateral femoral DVT.She was brought to [facility] for placement to SNF.Syndrome is consistent with isolated fever in a patient who underwent recent abdominal surgeries in the setting of perforated diverticulitis.On review of care everywhere it was noted that she had Staph epi in one of the blood cultures from 01/20/2022 from the OSH, unclear if it was a contaminant of real pathogen.Since admission here blood cultures have been clear.No clear consolidations on chest [place], it is likely that she could have aspirated.No abscess noted on [place] abdomen and pelvis.No indication of urinary infection, UA has only 3-5 WBC making UTI unlikely.Procal being negative makes bacterial infection very unlikely.Recommendations:Diagnostic:-consider RUQ ultrasound in the setting of elevated ALT and AST-follow up on LFTs-repeat blood culture if she spikes a fever >100.5 FTherapeutic:-discontinue antibiotics if the blood cultures continue to remain negative-would limit antibiotics for  a total of 5 days given the 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

## 2022-02-15 LAB — PT/INR AND PTT (BH GH L LMW YH)
BKR INR: 1.15 (ref 0.92–1.19)
BKR PARTIAL THROMBOPLASTIN TIME: 49.8 s — ABNORMAL HIGH (ref 23.0–31.4)
BKR PROTHROMBIN TIME: 11.9 s (ref 9.6–12.3)

## 2022-02-15 LAB — CBC WITH AUTO DIFFERENTIAL
BKR WAM ABSOLUTE IMMATURE GRANULOCYTES.: 0.02 x 1000/ÂµL (ref 0.00–0.30)
BKR WAM ABSOLUTE LYMPHOCYTE COUNT.: 1.4 x 1000/ÂµL (ref 0.60–3.70)
BKR WAM ABSOLUTE NRBC (2 DEC): 0 x 1000/ÂµL (ref 0.00–1.00)
BKR WAM ANALYZER ANC: 3.5 x 1000/ÂµL (ref 2.00–7.60)
BKR WAM BASOPHIL ABSOLUTE COUNT.: 0.03 x 1000/ÂµL (ref 0.00–1.00)
BKR WAM BASOPHILS: 0.5 % (ref 0.0–1.4)
BKR WAM EOSINOPHIL ABSOLUTE COUNT.: 0.55 x 1000/ÂµL (ref 0.00–1.00)
BKR WAM EOSINOPHILS: 9 % — ABNORMAL HIGH (ref 0.0–5.0)
BKR WAM HEMATOCRIT (2 DEC): 32.6 % — ABNORMAL LOW (ref 35.00–45.00)
BKR WAM HEMOGLOBIN: 9.5 g/dL — ABNORMAL LOW (ref 11.7–15.5)
BKR WAM IMMATURE GRANULOCYTES: 0.3 % (ref 0.0–1.0)
BKR WAM LYMPHOCYTES: 22.8 % (ref 17.0–50.0)
BKR WAM MCH (PG): 26.2 pg — ABNORMAL LOW (ref 27.0–33.0)
BKR WAM MCHC: 29.1 g/dL — ABNORMAL LOW (ref 31.0–36.0)
BKR WAM MCV: 90.1 fL (ref 80.0–100.0)
BKR WAM MONOCYTE ABSOLUTE COUNT.: 0.64 x 1000/ÂµL (ref 0.00–1.00)
BKR WAM MONOCYTES: 10.4 % (ref 4.0–12.0)
BKR WAM MPV: 10.6 fL (ref 8.0–12.0)
BKR WAM NEUTROPHILS: 57 % (ref 39.0–72.0)
BKR WAM NUCLEATED RED BLOOD CELLS: 0 % (ref 0.0–1.0)
BKR WAM PLATELETS: 312 x1000/ÂµL (ref 150–420)
BKR WAM RDW-CV: 19 % — ABNORMAL HIGH (ref 11.0–15.0)
BKR WAM RED BLOOD CELL COUNT.: 3.62 M/ÂµL — ABNORMAL LOW (ref 4.00–6.00)
BKR WAM WHITE BLOOD CELL COUNT: 6.1 x1000/ÂµL (ref 4.0–11.0)

## 2022-02-15 LAB — HEPATIC FUNCTION PANEL
BKR A/G RATIO: 0.4 — ABNORMAL LOW (ref 1.0–2.2)
BKR ALANINE AMINOTRANSFERASE (ALT): 147 U/L — ABNORMAL HIGH (ref 10–35)
BKR ALBUMIN: 2.1 g/dL — ABNORMAL LOW (ref 3.6–4.9)
BKR ALKALINE PHOSPHATASE: 124 U/L — ABNORMAL HIGH (ref 9–122)
BKR ASPARTATE AMINOTRANSFERASE (AST): 135 U/L — ABNORMAL HIGH (ref 10–35)
BKR AST/ALT RATIO: 0.9
BKR BILIRUBIN DIRECT: <0.2 mg/dL (ref <=0.3)
BKR BILIRUBIN TOTAL: 0.3 mg/dL (ref <=1.2)
BKR GLOBULIN: 5 g/dL — ABNORMAL HIGH (ref 2.3–3.5)
BKR PROTEIN TOTAL: 7.1 g/dL (ref 6.6–8.7)

## 2022-02-15 LAB — HEPATITIS C VIRUS BY RT-PCR, QUANTITATIVE     (BH GH L LMW YH): BKR HEPATITIS C VIRUS, QUANT. PCR, SERUM: NOT DETECTED

## 2022-02-15 LAB — CERULOPLASMIN: BKR CERULOPLASMIN: 31 mg/dL (ref 18–51)

## 2022-02-15 LAB — BASIC METABOLIC PANEL
BKR ANION GAP: 8 (ref 7–17)
BKR BLOOD UREA NITROGEN: 18 mg/dL (ref 8–23)
BKR BUN / CREAT RATIO: 52.9 — ABNORMAL HIGH (ref 8.0–23.0)
BKR CALCIUM: 9.7 mg/dL (ref 8.8–10.2)
BKR CHLORIDE: 113 mmol/L — ABNORMAL HIGH (ref 98–107)
BKR CO2: 25 mmol/L (ref 20–30)
BKR CREATININE: 0.34 mg/dL — ABNORMAL LOW (ref 0.40–1.30)
BKR EGFR, CREATININE (CKD-EPI 2021): >60 mL/min/{1.73_m2} (ref >=60)
BKR GLUCOSE: 95 mg/dL (ref 70–100)
BKR POTASSIUM: 3.8 mmol/L (ref 3.3–5.3)
BKR SODIUM: 146 mmol/L — ABNORMAL HIGH (ref 136–144)

## 2022-02-15 LAB — IMMUNOGLOBULINS IGG, IGA, IGM
BKR IGA: 327 mg/dL (ref 70–470)
BKR IGG: 3039 mg/dL — ABNORMAL HIGH (ref 700–1600)
BKR IGM: 196 mg/dL (ref 40–230)

## 2022-02-15 NOTE — Plan of Care
Problem: Adult Inpatient Plan of CareGoal: Plan of Care ReviewOutcome: Interventions implemented as appropriateFlowsheets (Taken 02/15/2022 0447)Progress: no changePlan of Care Reviewed With: patient Plan of Care Overview/ Patient Status    1900-0700: Pt A&Oxself. VSS on RA. No signs of pain or respiratory distress. Scheduled meds given per order. IV abx given. Ileostomy in place +flatus and stool. PEG tue in place, tan drainage. Bolus TF given 1.5 cartons of Pivot 1.5. Oral care performed.  Podus boots in place. T&Rq2h. Hourly rounding. Pt resting in bed. Safety precautions maintained, bed alarm on, call bell in reach, will ctm and follow nursing poc.

## 2022-02-15 NOTE — Consults
[facility] Digestive Diseases Hepatology Consult NoteReason for Consult: abnormal LFTsEvaluation requested by: Attending Provider: Charlaine Dalton, MD [phone] History: Danielle [name] is a 66 y.o. female with history of rheumatoid arthritis, previous prolonged hostapilization for perforated diverticulitis and large volume pneumonperitoneun s/p ex lap and R hemicolectomy/ileostomy in November who presents to [facility] for LTAC placement.Notably had a prolonged hospital course in November 2022 that led up to mid February. On review of documentation by Dr. Renelda Loma H&P, it appears that she was originally admitted in late November for a R hemicolectomy and end ileostomy. During that hospitalization, it was noted that she had persistent metabolic encephalopathy with extensive workup largely unrevealing.  There were issues with GOC and obtaining familial consent for medical decision making. Ultimately, the patient underwent PEG tube placement on 02/04/2022 and had discharged to SNF on 02/05/2022 reportedly to [facility].  Notably, the sister apparently moved her form the [facility] SN[facility] to [place] to live, but unfortunately the sister cuiold not take care of her.  Because of this, she was brought to to [facility] ED to assist with placement.Notably on arrival, it was noted that she had a low grade fever. Because of this, she was stared on antibiotics empirically.  Notably antibiotic exposure as below:Ceftriaxone 2/21Zosyn 2/21 - presentToday, the hospitalist team decided to recheck LFTs today and was ntoed to ALT/AST elevated at 189/200.  Initial workup had not been completed. Notably, on review of Care Everywhere, it appears that the patient has not had any signfiicant LFT abnormalities, except for an acute period from 1/18 - 1/21, presumably from drug exposure. Review of Systems: A 10 point review of systems was performed and is negative except pertinent findings in HPI.Review of Medical/Family/Surgical/Social History/Medications: No past medical history on file. No family history on file. No past surgical history on file.   Outpatient Medications:No current facility-administered medications on file prior to encounter. Current Outpatient Medications on File Prior to Encounter Medication Sig Dispense Refill ? acetaminophen (TYLENOL) 160 mg/5 mL elixir 500 mg by Per G Tube route every 6 (six) hours as needed.   ? acetaminophen (TYLENOL) 500 mg tablet Give 2 tablets via PEG-tube every 6 hours as needed   ? apixaban (ELIQUIS) 5 mg tablet 1 tablet (5 mg total) by Per G Tube route every 12 (twelve) hours.   ? chlorhexidine gluconate (PERIDEX) 0.12 % solution Use as directed 15 mLs in the mouth or throat 2 (two) times daily.   ? Elemental iron 60 mg (300 mg Ferrous sulfate) / 5 mL oral liquid Give 3.7 mL by g-tube two times daily   ? folic acid (FOLVITE) 1 mg tablet 1 tablet (1 mg total) by Per G Tube route daily.   ? insulin lispro (ADMELOG, HUMALOG) 100 unit/mL vial Inject under the skin 3 (three) times daily before meals. Use per sliding scale.   ? niacin 50 mg Immediate Release tablet 2 tablets (100 mg total) by Per G Tube route at bedtime.   ? pantoprazole (PROTONIX) 40 mg GrPS oral suspension 1 packet (40 mg total) by Per G Tube route daily.   ? psyllium (METAMUCIL) packet Give 1 packet via g-tube two times daily   ? pyridoxine, vitamin B6, (VITAMIN B-6) 100 mg tablet 1 tablet (100 mg total) by Per G Tube route daily.   ? rosuvastatin (CRESTOR) 10 mg tablet 1 tablet (10 mg total) by Per G Tube route nightly.   ? therapeutic multivitamin liquid 15 mLs by  Per G Tube route daily.   ? thiamine (VITAMIN B1) 100 mg tablet 1 tablet (100 mg total) by Per G Tube route 2 (two) times daily.   ? insulin aspart U-100 (NOVOLOG) 100 unit/mL vial Inject 0-9 Units under the skin 4 (four) times daily before meals & at bedtime. (Patient not taking: Reported on 02/11/2022)   Objective: Vitals:Temp:  [97.3 ?F (36.3 ?C)-97.6 ?F (36.4 ?C)] 97.3 ?F (36.3 ?C)Pulse:  [75-105] 75Resp:  [18-20] 20BP: (100-107)/(66-71) 107/71SpO2:  [97 %-98 %] 98 %Device (Oxygen Therapy): room airPhysical Exam:GENERAL: awake, but difficult to understand. Doesnot respond to a lot of commandsENT: Moist mucous membranes, poor teethCARDIOVASCULAR: Regular rate and rhythm, no murmursRESPIRATORY: No respiratory distress on RAGASTROINTESTINAL: G tube in place, with some leakage. Nontender to palpationSKIN: Warm and dry. No edemaNEURO: Moves all four extremitiesMedications:SCHEDULED:Current Facility-Administered Medications Medication Dose Route Frequency Provider Last Rate Last Admin ? [Held by provider] apixaban (ELIQUIS) tablet 5 mg  5 mg Per G Tube Q12H Littie Deeds, MD     ? chlorhexidine gluconate (PERIDEX) 0.12 % solution 15 mL  15 mL Mouth/Throat BID Dorisann Frames, MD   15 mL at 02/14/22 0981 ? Elemental iron 60 mg (300 mg Ferrous sulfate) / 5 mL oral liquid 45 mg  45 mg Per G Tube BID Dorisann Frames, MD   45 mg at 02/14/22 0827 ? famotidine (PEPCID) suspension (8 mg/mL) 20 mg  20 mg Per G Tube Daily Dorisann Frames, MD   20 mg at 02/14/22 1026 ? folic acid (FOLVITE) tablet 1 mg  1 mg Per G Tube Daily Dorisann Frames, MD   1 mg at 02/14/22 1914 ? niacin Immediate Release tablet 100 mg  100 mg Per G Tube Nightly Dorisann Frames, MD   100 mg at 02/13/22 2201 ? piperacillin-tazobactam (ZOSYN) 4.5 g in sodium chloride 0.9% 100 mL (mini-bag plus)  4.5 g Intravenous Q6H Charlaine Dalton, MD     ? pyridoxine (vitamin B6) (B-6) tablet 100 mg  100 mg Per G Tube Daily Dorisann Frames, MD   100 mg at 02/14/22 7829 ? rosuvastatin (CRESTOR) tablet 10 mg  10 mg Per G Tube Nightly Dorisann Frames, MD   10 mg at 02/13/22 2201 ? sodium chloride 0.9 % flush 3 mL  3 mL IV Push Q8H Dorisann Frames, MD   3 mL at 02/13/22 0542 ? thiamine (VITAMIN B1) tablet 100 mg  100 mg Per G Tube BID Dorisann Frames, MD   100 mg at 02/14/22 5621 HYQ:MVHQIONGEXBMW, dextrose (GLUCOSE) oral liquid 15 g **OR** fruit juice **OR** skim milk, dextrose (GLUCOSE) oral liquid 30 g **OR** fruit juice, dextrose injection, dextrose injection, glucagon, sodium chlorideLabs:Recent Labs Lab 02/22/230512 02/23/230544 02/24/230509 WBC 11.0 12.6* 7.1 HGB 9.4* 9.3* 9.2* HCT 31.80* 31.00* 31.50* PLT 350 380 321  Recent Labs Lab 02/21/230418 NEUTROPHILS 74.1*  Recent Labs Lab 02/22/230512 02/22/231703 02/23/230544 02/23/231650 02/24/230509 02/24/231144 NA 144  --  145*  --  149*  --  K 3.3  --  3.1*  --  3.2*  --  CL 109*  --  110*  --  114*  --  CO2 24  --  25  --  26  --  BUN 16  --  19  --  23  --  CREATININE 0.72  --  0.50  --  0.36*  --  GLU 97   < > 113*   < >  106* 178* ANIONGAP 11  --  10  --  9  --   < > = values in this interval not displayed.  Recent Labs Lab 02/22/230512 02/23/230544 02/24/230509 CALCIUM 11.3* 11.2* 10.3* MG  --   --  2.0  Recent Labs Lab 02/21/230418 02/24/230509 ALT 35 189* AST  --  200* ALKPHOS 108 103 BILITOT <0.2 0.4 BILIDIR  --  0.2 PROT 10.0* 7.5 ALBUMIN 2.7* 2.2*  Recent Labs Lab 02/23/231327 02/23/232006 02/24/230509 PTT 40.2* 45.3* 57.1*  No results found for: IRON, TIBC, FERRITIN Imaging:[place] Abdomen Pelvis w contrast (02/12/2022):Lung bases: Dependent atelectasis and patchy groundglass changes are seen.Hepatobiliary: Unremarkable.Pancreas: Unremarkable.Spleen: Unremarkable.Adrenal glands: Unremarkable.Kidneys: Simple appearing left renal cyst.Bowel: Percutaneous gastrostomy in situ. 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 Oral contrast extends into right lower quadrant enterostomy. Extensive diverticulosis in the large bowel. No definite findings of acute diverticulitis.Abdominal and pelvic lymph nodes: No lymphadenopathy.Peritoneum: Multiple peritoneal nodules are seen, most conspicuous adjacent to the gallbladder and along right lateral conal fascia.Vasculature: No abdominal aortic aneurysm. Pelvis: Calcified uterine fibroids. Hypodense thickening of endometrium, measuring 1.4 cmMusculoskeletal system and soft tissue: No aggressive osseous lesion.IMPRESSION:No collection in abdomen or pelvis. 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 Correlation with prior imaging is recommended.Procedural:ReviewedImpression & Recommendations: Danielle [name] is a 66 y.o. female with history of rheumatoid arthritis, previous prolonged hostapilization for perforated diverticulitis and large volume pneumonperitoneun s/p ex lap and R hemicolectomy/ileostomy in November who presents to [facility] for LTAC placement. Hepatology was consulted for abnormal LFTs.Noted one day of LFT abnormality with AST/ALT 189/200s. 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  Overall, likely ceftriaxone (for which patient received one dose) vs zosyn. Prior measurements of LFTs at OSH seen in Care Everywhere largely have been normal, so do not suspect chornic liver disease, although we can send off studies to rule out a recurrent hepatitis pattern (Wilson's, autoimmune hepatitis).- Continue to trend LFTs, INR- Check ANA, Immunoglobulins, SMA, ceruloplasmin- Check liver ultrasound with doppler- F/u acute hepatitis panel- Agree with ID to limit antibiotic exposureWe will continue to follow.Case discussed with Dr. Drucilla Schmidt, final attending attestation to follow. Please do not hesitate to contact our team with any questions or concerns.Lytle Butte, MDYale Digestive DiseasesBest Contact: Mobile Heartbeat [phone] after 5pm and weekends, please contact on-call GI fellow as listed on Amion

## 2022-02-15 NOTE — Progress Notes
[facility]	 [facility]	Daily Progress NoteHospital Medicine ServiceAttending Provider: Charlaine Dalton, MD [phone]:  9831/9831-AHospital Day #4 Subjective: Patient seen and examined on 02/15/2022, 11:37 AM.CC: Fever, unspecified fever causeInterim History: EMR reviewed.  Pt saying no to all answers with exception of where she currently was, where she answered I don't know. Objective: Current Meds:[Held by provider] apixaban, 5 mg, Q12Hchlorhexidine gluconate, 15 mL, BIDElemental iron, 45 mg, BIDfamotidine, 20 mg, Dailyfolic acid, 1 mg, Dailyniacin, 100 mg, Nightlypiperacillin-tazobactam, 4.5 g, Q6Hpyridoxine (vitamin B6), 100 mg, Daily[Held by provider] rosuvastatin, 10 mg, Nightlysodium chloride, 3 mL, Q8Hthiamine, 100 mg, BID Vitals:Temp:  [97.3 ?F (36.3 ?C)-98.7 ?F (37.1 ?C)] 98.7 ?F (37.1 ?C)Pulse:  [84-93] 84Resp:  [16-20] 18BP: (102-106)/(66-70) 104/70SpO2:  [98 %-100 %] 99 %Device (Oxygen Therapy): room airRecent Labs   02/24/232120 02/25/230531 02/25/230845 GLU 132* 95 102* I/O's:Intake/Output Summary (Last 24 hours) at 02/15/2022 1137Last data filed at 02/15/2022 0654Gross per 24 hour Intake 355 ml Output 1650 ml Net -1295 ml Physical Exam Physical ExamCardiovascular:    Rate and Rhythm: Normal rate and regular rhythm.    Heart sounds: No murmur heard.  No friction rub. No gallop. Pulmonary:    Effort: No respiratory distress.    Breath sounds: No stridor. No wheezing or rhonchi. Abdominal:    General: Bowel sounds are normal. There is no distension.    Palpations: Abdomen is soft.    Tenderness: There is no abdominal tenderness. There is no guarding. Skin:   General: Skin is warm and dry. Neurological:    Mental Status: She is alert. Diagnostics:  I have reviewed new labs and imaging over the last 24 hours with the following notable findings:Radiology: No results found. Labs:  Na 146I reviewed lab results in formulating this patient's plan of care.Assessment: 66 y.o. 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  ID consulted to help with source of infection, improving on IV abx.  Course c/b G-tube leaking, hypernatremia, transaminitis. Plan: 1.  ID:  Fever, leukocytosis- appreciate ID consult- continue zosyn- BCx NGTD x 22.  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  HCV PCR pending.- ANA and SMA ordered but not collected.  Will reorder for next blood draw.- appreciate IR consult for peritoneal nodules- pt had prior [place] done at [facility] in [facility].  Release form faxed to their radiology office for CD of images to be mailed to [employer] office. IR to be reconsulted after images have been obtained.3.  NEURO:  Encephalopathy- will consider neurology consult if not improvement despite treating possible infection and improving electrolytes4.  VASC:  DVT- continue hep gtt, pending possible IR intervention5.  FEN:  Leaking g-tube, hypernatremia- Na improving with IVF, will continue.  Recheck Na tomorrow morning.- appreciate surgery's assistance with g-tube.  Bumper readjusted, tube may be used.Diet Tube Feed No TrayVTE PPx: Heparin InfusionMedication Reconciliation: Completed: by Pharmacist Communication: MDDischarge Readiness: Expected discharge timeframe: UndeterminedExpected discharge location: Short Term Rehab Full ACLSMedical decision making for this patient was moderateI spent 35 minutes today on this encounter before, during, and after the visit, reviewing labs and records, evaluating and examining the patient, entering orders and documenting the visit.Signed:Heather Carney, PA-C Please contact via Mobile HeartbeatAfter 16:30, please contact covering provider or provider  assigned to the dynamic role of [facility] Hospitalist Coverage or [facility] Hospitalist Coverage. 11:37 AMI saw and evaluated the patient, [name]. I have reviewed the relevant laboratory and radiology data, as well as other relevant past medical history in the chart. I agree with the findings, physical exam, assessment and the plan of care as documented in the above note, with the following supplemental comments and with the relevant changes. Of note mental status improved somewhat today. I have added free water flushes to try and lower her sodium further. Likely it rose because she had no FWF. If she continues to have confusion despite improvement in her electrolytes and possible infection then we should consult neurology for input. She is awaiting RUQ [place] with dopplers for her elevated liver chemistries.Signed:Jenipher Havel Delfina Redwood, MDHospitalist

## 2022-02-15 NOTE — Other
[facility]           Surgical Consult H&PConsult to: Peri Jefferson, MDConsult from: Charlaine Dalton, MDAdmit Date: 2/21/2023HPI:Danielle [name] is an 66 y.o. 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  Patient is a [place] resident, family brought her from [place] short-term rehab facility. Surgery has been consulted for leaking G-tube.  Patient not reliable historian, per Nursing G-tube leaking specially after cycled feeds. On evaluation patient HDS, afebrile, on exam abdomen soft, ex lap incision healing, ileostomy on right side of the abdomen with output, mucous fistula on left side of abdomen, gastric to in place would minimal leak at the moment evaluation, of note bumper noted to be loose. [place] scan on records showed PEG bowel own inflated inside the stomach.ZOX:WRUEAV to assess. PMH:She  has no past medical history on file.PSH:She  has no past surgical history on file.WU:[id number] History Socioeconomic History ? Marital status: Single   Spouse name: Not on file ? Number of children: Not on file ? Years of education: Not on file ? Highest education level: Not on file Occupational History ? Not on file Tobacco Use ? Smoking status: Not on file ? Smokeless tobacco: Not on file Substance and Sexual Activity ? Alcohol use: Not on file ? Drug use: Not on file ? Sexual activity: Not on file Other Topics Concern ? Not on file Social History Narrative ? Not on file Social Determinants of Health Financial Resource Strain: Not on file Food Insecurity: Not on file Transportation Needs: Not on file Physical Activity: Not on file Stress: Not on file Social Connections: Not on file Intimate Partner Violence: Not on file Housing Stability: High Risk ? Housing Stability: I do not have a steady place to live (temporarily staying with others, in a hotel, in a shelter, living outside on the street, on a beach, in a car, abandoned building, bus or train station, or in a park) MEDICATIONS: Prior to Admission medications  Medication Sig Start Date End Date Taking? Authorizing Provider acetaminophen (TYLENOL) 160 mg/5 mL elixir 500 mg by Per G Tube route every 6 (six) hours as needed. 02/05/22  Yes Provider, Historical acetaminophen (TYLENOL) 500 mg tablet Give 2 tablets via PEG-tube every 6 hours as needed   Yes Provider, Historical apixaban (ELIQUIS) 5 mg tablet 1 tablet (5 mg total) by Per G Tube route every 12 (twelve) hours. 02/05/22  Yes Provider, Historical chlorhexidine gluconate (PERIDEX) 0.12 % solution Use as directed 15 mLs in the mouth or throat 2 (two) times daily.   Yes Provider, Historical Elemental iron 60 mg (300 mg Ferrous sulfate) / 5 mL oral liquid Give 3.7 mL by g-tube two times daily 02/05/22  Yes Provider, Historical folic acid (FOLVITE) 1 mg tablet 1 tablet (1 mg total) by Per G Tube route daily. 02/06/22  Yes Provider, Historical insulin lispro (ADMELOG, HUMALOG) 100 unit/mL vial Inject under the skin 3 (three) times daily before meals. Use per sliding scale.   Yes Provider, Historical niacin 50 mg Immediate Release tablet 2 tablets (100 mg total) by Per G Tube route at bedtime.   Yes  Provider, Historical pantoprazole (PROTONIX) 40 mg GrPS oral suspension 1 packet (40 mg total) by Per G Tube route daily. 02/05/22  Yes Provider, Historical psyllium (METAMUCIL) packet Give 1 packet via g-tube two times daily   Yes Provider, Historical pyridoxine, vitamin B6, (VITAMIN B-6) 100 mg tablet 1 tablet (100 mg total) by Per G Tube route daily.   Yes Provider, Historical rosuvastatin (CRESTOR) 10 mg tablet 1 tablet (10 mg total) by Per G Tube route nightly. 08/27/21  Yes Provider, Historical therapeutic multivitamin liquid 15 mLs by Per G Tube route daily.   Yes Provider, Historical thiamine (VITAMIN B1) 100 mg tablet 1 tablet (100 mg total) by Per G Tube route 2 (two) times daily. 02/05/22  Yes Provider, Historical insulin aspart U-100 (NOVOLOG) 100 unit/mL vial Inject 0-9 Units under the skin 4 (four) times daily before meals & at bedtime.Patient not taking: Reported on 02/11/2022 02/05/22   Provider, Historical  ALLERGIES: She  has No Known Allergies.OBJECTIVEVitals:Temp:  [97.3 ?F (36.3 ?C)-99.1 ?F (37.3 ?C)] 97.3 ?F (36.3 ?C)Pulse:  [75-105] 75Resp:  [18-20] 20BP: (100-107)/(66-71) 107/71SpO2:  [97 %-99 %] 98 %Device (Oxygen Therapy): room airI/O's:I/O last 3 completed shifts:In: 870 [NG/GT:820; IV Piggyback:50]Out: 500 [Stool:500]Physical Exam:Gen: NAD, A&Ox1CV: Regular rate, hemodynamically stablePulm: Normal work of breathing on room airAbd:  soft, ex lap incision healing, ileostomy on right side of the abdomen with output, mucous fistula on left side of abdomen, gastric to in place would minimal leak at the moment evaluation, of note bumper noted to be loose. Labs:Recent Labs Lab 02/22/230512 02/23/230544 02/24/230509 WBC 11.0 12.6* 7.1 HGB 9.4* 9.3* 9.2* HCT 31.80* 31.00* 31.50* PLT 350 380 321 Recent Labs Lab 02/21/230418 02/21/230424 02/22/230512 02/22/231703 02/23/230544 02/23/231650 02/23/232130 02/24/230509 NA  --    < > 144  --  145*  --   --  149* K  --    < > 3.3  --  3.1*  --   --  3.2* CL 109*   < > 109*  --  110*  --   --  114* CO2 23   < > 24  --  25  --   --  26 BUN 17   < > 16  --  19  --   -- 23 CREATININE 0.35*   < > 0.72  --  0.50  --   --  0.36* GLU 138*   < > 97   < > 113* 97 158* 106* CALCIUM 12.0*   < > 11.3*  --  11.2*  --   --  10.3* MG 2.2  --   --   --   --   --   --  2.0  < > = values in this interval not displayed. Recent Labs Lab 02/21/230418 02/24/230509 ALT 35 189* AST  --  200* ALKPHOS 108 103 BILITOT <0.2 0.4 BILIDIR  --  0.2 LIPASE 19  --  Recent Labs Lab 02/23/231327 02/23/232006 02/24/230509 PTT 40.2* 45.3* 57.1* Diagnostics:No results found. ASSESSMENT:Katriona Madding is an 66 y.o. female with PMH as detailed above, admitted to the hospital for altered mental status and fever workup. Surgery has been consulted for leaking gastric tube.  Bumper of gastric tube found to be loose during evaluation, we adjusted at bedside. RECOMMENDATIONS:- G-tube bumper readjusted- Please call surgery with further questions or concerns Discussed with Dr. Lawerance Cruel (Chief) who discussed it with Dr. Everett Graff (Attending)Further recommendations to follow from attendingRECOMMENDATIONS ARE NOT FINAL UNTIL ATTENDING HAS SIGNED NOTE.Signed: Ridge Lafond Jearld Pies, MD PGY-2Mobile HB: [phone]/24/2311:33 AM

## 2022-02-15 NOTE — Plan of Care
Plan of Care Overview/ Patient Status    07:00 - 15:00Vital signs stable. No complaints or issues on shift. Remained on Heparin continuous infusion and tolerated well. Tube feed boluses tolerated well. IV Abx and scheduled medications given as ordered. See flow sheet for full assessment.

## 2022-02-15 NOTE — Plan of Care
Plan of Care Overview/ Patient Status    1500-1900 hrs  Pt A&Ox2, confused to time and situation.  VSS, tolerating RA.  Lung fields diminished.  NPO,Bolus Feed, a/o and free water flush via PEG tube.  Leaking at insertions site, redness, barrier applied.  No residual.  Purewick in place.  Ileostomy appliance CD&I patent, liquid.   L wound site w/ pouch, no drainage.   Assist of 2 in bed,  T&R q2 hrs and prn.  Oral care done for dry mouth and lips, vaseline applied to lips.  Pt denies pain.   Fall precautions w/ bed alarm in use.   Will  monitor.

## 2022-02-16 LAB — COMPREHENSIVE METABOLIC PANEL
BKR A/G RATIO: 0.3 — ABNORMAL LOW (ref 1.0–2.2)
BKR ALANINE AMINOTRANSFERASE (ALT): 136 U/L — ABNORMAL HIGH (ref 10–35)
BKR ALBUMIN: 1.8 g/dL — ABNORMAL LOW (ref 3.6–4.9)
BKR ALKALINE PHOSPHATASE: 138 U/L — ABNORMAL HIGH (ref 9–122)
BKR ANION GAP: 8 (ref 7–17)
BKR ASPARTATE AMINOTRANSFERASE (AST): 141 U/L — ABNORMAL HIGH (ref 10–35)
BKR AST/ALT RATIO: 1
BKR BILIRUBIN TOTAL: 0.3 mg/dL (ref <=1.2)
BKR BLOOD UREA NITROGEN: 15 mg/dL (ref 8–23)
BKR BUN / CREAT RATIO: 50 — ABNORMAL HIGH (ref 8.0–23.0)
BKR CALCIUM: 9.7 mg/dL (ref 8.8–10.2)
BKR CHLORIDE: 113 mmol/L — ABNORMAL HIGH (ref 98–107)
BKR CO2: 23 mmol/L (ref 20–30)
BKR CREATININE: 0.3 mg/dL — ABNORMAL LOW (ref 0.40–1.30)
BKR EGFR, CREATININE (CKD-EPI 2021): >60 mL/min/{1.73_m2} (ref >=60)
BKR GLOBULIN: 5.4 g/dL — ABNORMAL HIGH (ref 2.3–3.5)
BKR GLUCOSE: 99 mg/dL (ref 70–100)
BKR POTASSIUM: 3.6 mmol/L (ref 3.3–5.3)
BKR PROTEIN TOTAL: 7.2 g/dL (ref 6.6–8.7)
BKR SODIUM: 144 mmol/L (ref 136–144)

## 2022-02-16 LAB — CBC WITHOUT DIFFERENTIAL
BKR WAM ANALYZER ANC: 3.63 x 1000/ÂµL (ref 2.00–7.60)
BKR WAM HEMATOCRIT (2 DEC): 30.5 % — ABNORMAL LOW (ref 35.00–45.00)
BKR WAM HEMOGLOBIN: 9.2 g/dL — ABNORMAL LOW (ref 11.7–15.5)
BKR WAM MCH (PG): 26.4 pg — ABNORMAL LOW (ref 27.0–33.0)
BKR WAM MCHC: 30.2 g/dL — ABNORMAL LOW (ref 31.0–36.0)
BKR WAM MCV: 87.6 fL (ref 80.0–100.0)
BKR WAM MPV: 10.8 fL (ref 8.0–12.0)
BKR WAM PLATELETS: 289 x1000/ÂµL (ref 150–420)
BKR WAM RDW-CV: 19.2 % — ABNORMAL HIGH (ref 11.0–15.0)
BKR WAM RED BLOOD CELL COUNT.: 3.48 M/ÂµL — ABNORMAL LOW (ref 4.00–6.00)
BKR WAM WHITE BLOOD CELL COUNT: 6.3 x1000/ÂµL (ref 4.0–11.0)

## 2022-02-16 LAB — PROTIME AND INR
BKR INR: 1.16 (ref 0.92–1.19)
BKR PROTHROMBIN TIME: 12 s (ref 9.6–12.3)

## 2022-02-16 MED ORDER — ENOXAPARIN 80 MG/0.8 ML SUBCUTANEOUS SYRINGE
800.8 mg/0.8 mL | Freq: Two times a day (BID) | SUBCUTANEOUS | Status: DC
Start: 2022-02-16 — End: 2022-02-17
  Administered 2022-02-16 – 2022-02-17 (×3): 80 mg/0.8 mL via SUBCUTANEOUS

## 2022-02-16 NOTE — Plan of Care
Plan of Care Overview/ Patient Status    1900-0700. VSS on room air. A&Ox1. No indication of pain. Incontinent of B&B, purewick in place. Pills crushed through G tube. Bolus feed of PIVOT through the G tube this shift. Ileostomy draining brown output. No oral intake. Hep gtt infusing @ 14 units, kg/hr. Safety maintained, bed alarm on.

## 2022-02-16 NOTE — Plan of Care
Plan of Care Overview/ Patient Status    VSS on room air. A&Ox1. No indication of pain. Incontinent of B&B, purewick in place. Pills crushed through G tube. Bolus feed of PIVOT through the G tube this shift. Ileostomy draining brown output. No oral intake.colostomy intact as well with no output. Heparin Dc 'd with transition to Lovenox. Safety maintained, bed alarm on. Temp:  [98 ?F (36.7 ?C)-99.1 ?F (37.3 ?C)] 98.2 ?F (36.8 ?C)Pulse:  [85-97] 89Resp:  [16-18] 16BP: (101-110)/(68-75) 109/75SpO2:  [97 %-99 %] 98 %Device (Oxygen Therapy): room airElectronically Signed by Ernestene Kiel, RN, February 16, 2022

## 2022-02-16 NOTE — Progress Notes
[facility]	 [facility]	Daily Progress NoteHospital Medicine ServiceAttending Provider: Charlaine Dalton, MD [phone]:  [id number]/9831-AHospital Day #5 Subjective: Patient seen and examined on 02/16/2022, 11:00 AM.CC: Fever, unspecified fever causeInterim History: EMR reviewed.  Pt slightly more interactive today, was answering questions more appropriately as compared to yesterday.  She was able to tell me her name, but unable to state location or month/year.  She would not answer these questions, and would either stare straight ahead or go to sleep.  She said I don't have any when asked if she has pain. Objective: Current Meds:[Held by provider] apixaban, 5 mg, Q12Hchlorhexidine gluconate, 15 mL, BIDElemental iron, 45 mg, BIDenoxaparin (LOVENOX) treatment, 80 mg, Q12Hfamotidine, 20 mg, Dailyfolic acid, 1 mg, Dailyniacin, 100 mg, Nightlypyridoxine (vitamin B6), 100 mg, Daily[Held by provider] rosuvastatin, 10 mg, Nightlysodium chloride, 3 mL, Q8Hthiamine, 100 mg, BID Vitals:Temp:  [98 ?F (36.7 ?C)-99.1 ?F (37.3 ?C)] 98.2 ?F (36.8 ?C)Pulse:  [85-97] 89Resp:  [16-18] 16BP: (101-110)/(68-75) 109/75SpO2:  [97 %-99 %] 98 %Device (Oxygen Therapy): room airRecent Labs   02/26/230455 02/26/230853 02/26/231053 GLU 99 96 115* I/O's:Intake/Output Summary (Last 24 hours) at 02/16/2022 1100Last data filed at 02/15/2022 2200Gross per 24 hour Intake 755 ml Output 220 ml Net 535 ml Physical Exam Physical ExamConstitutional:     Comments: Sleepy, but responsive Cardiovascular:    Rate and Rhythm: Normal rate and regular rhythm.    Heart sounds: No murmur heard.  No friction rub. No gallop. Pulmonary:    Effort: No respiratory distress.    Breath sounds: Normal breath sounds. No stridor. No wheezing or rhonchi. Abdominal:    General: Bowel sounds are normal. There is no distension.    Palpations: Abdomen is soft.    Tenderness: There is no guarding.    Comments: +ostomy with stool in bag Skin:   General: Skin is warm and dry. Diagnostics:  I have reviewed new labs and imaging over the last 24 hours with the following notable findings:Radiology: No results found. I reviewed lab results in formulating this patient's plan of care.Assessment: 66 y.o. 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  ID consulted to help with source of infection, improving on IV abx.  Course c/b G-tube leaking, hypernatremia, transaminitis. Plan: 1.  ID:  Fever, leukocytosis- appreciate ID consult- will d/c zosyn today, received 5 days of abx- BCx NGTD x 22.  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  HCV PCR not detected.- ANA and SMA pending.- appreciate IR consult for peritoneal nodules- pt had prior [place] done at [facility] in [facility].  Release form faxed to their radiology office for CD of images to be mailed to [employer] office. IR to be reconsulted after images have been obtained.3.  NEURO:  Encephalopathy- persists- neuro consulted, will f/u recs4.  VASC:  DVT- d/c hep gtt and transition to lovenox for now- resume doac if no IR intervention is planned after they review OSH images5.  FEN:  Leaking g-tube, hypernatremia- Na stable- appreciate surgery's assistance with g-tube.  Bumper readjusted, tube may be used.Diet Tube Feed No TrayVTE PPx: LovenoxMedication Reconciliation: Completed: by Pharmacist Communication: Consultants and MDDischarge Readiness: Expected discharge timeframe: UndeterminedExpected discharge location: Short Term Rehab Full ACLSMedical decision making for this patient was moderateI spent  35 minutes today on this encounter before, during, and after the visit, reviewing labs and records, evaluating and examining the patient, entering orders and documenting the visit.Signed:Heather Carney, PA-C Please contact via Mobile HeartbeatAfter 16:30, please contact covering provider or provider assigned to the dynamic role of [facility] Hospitalist Coverage or [facility] Hospitalist Coverage. 11:00 AMI saw and evaluated the patient, [name]. I have reviewed the relevant laboratory and radiology data, as well as other relevant past medical history in the chart. I agree with the findings, physical exam, assessment and the plan of care as documented in the above note, with the following supplemental comments and with the relevant changes. Of note slightly improved mental status today but still far off her reported baseline prior to hospitalization in [place]. I have consulted neurology for further input. 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 We are waiting for imaging from [place] to help IR with planning. We are also awaiting her liver ultrasound with dopplers to better evaluate her abnormal liver chemistries. Signed:Larayah Clute Delfina Redwood, MDHospitalist

## 2022-02-16 NOTE — Other
[facility] HealthNeurology New Consult NoteAttending Provider: Charlaine Dalton, MD Hospital day: 5Reason for consultation: AMS/encephalopathy HISTORY Danielle [name] is a 66 y.o. year old female with notable PMHx of RA on MTX with recent prolonged hospitalization at outside hospital now admitted for fever, with prolonged encephalopathy for which neurology is consulted. OSH Course: Had prolonged hospitalization at [facility] followed by discharge to a SNF in [place] on 02/05/22. She was initially admitted 11/17/21 after she was found to have pneumoperitoneum 2/2 diverticular perforation and distal colon structure requiring ex lap and extended R hemicolectomy with end ileostomy. POD 5 started having confusion and this was followed by prolonged AMS and reportedly had CTH, brain MRI and serum workup in December 2022 only notable for folate deficiency and she was given folate and thiamine supplementation. She was evaluated by psychiatry and felt to not be able to make own decisions. She was unable to maintain sufficient caloric intake and patient refused NGT replacement. Family struggled with decision for PEG and patient was ultimately put on prolonged TPN. Ethics was consulted for assistance. Course was complication by an abscess at incision site s/p I&D. 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 She continued to be altered a repeat MRI brain and total spine was obtained in January with no new findings. At the end of January patient developed a fever, so PICC line for TPN was pulled. Cultures grew staph epidermidis but was also felt to have UTI. PEG was still not placed and ethics was re-involved. At end of January PT signed off given patient regression and unwillingness to participate in therapies. rEEG was completed on 1/16 and just showed slowing. On repeat [place] prior to PEG placement was also found to have bilateral DVTs and was put on heparin drip. She was transitioned to Eliquis early February after PEG placement. On 02/04/22 PEG was dislodged but was repealed by IR. She was d/c to SNF on 02/05/22. Patient's sister transported sister here shortly thereafter to obtain different placement.  [facility] hospital course: 2/21 - brought to ED and noted to have fever to 100.3 and was tachycardic. She was admitted to medicine and started on CTX. PT evaluated and patient was non verbal, not moving any limbs and was crying out in pain every time she was touched in her limbs. [place] CAP was obtained and showed peritoneal nodules which primary team is hoping can be biopsied. ID consulted and not convinced of ongoing systemic infections but c/f carcinomatosis. Neurology consulted today given ongoing encephalopathy (now for 3 months) without clear etiology. Today:Makes no spontaneous speech. After repeated questioning and requests for her to participate in neuro exam yelled at me to please get out of her face. Later when I asked if it was hurting if I touched her legs she also yelled of course this hurts. EXAM Neurological Exam:MS: ?	Sleeping wakes easily to voice?	Not making eye contact  ?	After repeated prompting looked me in the eye and firmly stated will you please just get out of my face?	Not naming, not repeating, not following ?	Attends on both sides CN: ?	Pupils equal?	+BTT bilaterally ?	Could not comply with EOM but had intact OCR ?	Facial movements symmetric on grimace Motor/Movement: ?	Decreased tone?	Feet in plantar flexed position at rest?	No movement in any extremities to command or noxious Coordination:  ?	NTReflexes: ?	3+ biceps, triceps, BR?	trace patella?	2+ achilles?	R achilles reflex elicited sustained clonus, L did not ?	Toes mute?	positive Hoffman's bilaterally ?  No pectorals Sensory: ?	Grimace to noxious in all 4 ?	Seems to have diffuse allodynia of b/l lowers; crying out in pain when touching lower extremities OBJECTIVE DATA Recent Labs Lab 02/24/230509 02/25/230531 02/26/230455 WBC 7.1 6.1 6.3 HGB 9.2* 9.5* 9.2* HCT 31.50* 32.60* 30.50* PLT 321 312 289  Recent Labs Lab 02/21/230418 02/25/230531 NEUTROPHILS 74.1* 57.0  Recent Labs Lab 02/24/230509 02/24/231144 02/25/230531 02/25/230845 02/26/230455 02/26/230853 02/26/231053 NA 149*  --  146*  --  144  --   --  K 3.2*  --  3.8  --  3.6  --   --  CL 114*  --  113*  --  113*  --   --  CO2 26  --  25  --  23  --   --  BUN 23  --  18  --  15  --   --  CREATININE 0.36*  --  0.34*  --  0.30*  --   --  GLU 106*   < > 95   < > 99   < > 115* ANIONGAP 9  --  8  --  8  --   --   < > = values in this interval not displayed.  Recent Labs Lab 02/24/230509 02/25/230531 02/26/230455 CALCIUM 10.3* 9.7 9.7 MG 2.0  --   --   Recent Labs Lab 02/24/230509 02/25/230531 02/26/230455 ALT 189* 147* 136* AST 200* 135* 141* ALKPHOS 103 124* 138* BILITOT 0.4 0.3 0.3 BILIDIR 0.2 <0.2  --   Recent Labs Lab 02/23/232006 02/24/230509 02/25/230531 02/26/230455 PTT 45.3* 57.1* 49.8*  --  LABPROT  --   --  11.9 12.0 INR  --   --  1.15 1.16  No results for input(s): PHART, PCO2ART, PO2ART, HCO3ART, O2SATART, LITERFLOW in the last 168 hours.  ADDITIONAL WORKUP:EEG 01/06/22 This is abnormal EEG, the Presence of Generalized slowing is consistent with Encephalopathy - Dr Marianne Sofia Brain 01/05/22 IMPRESSION: No evidence of recent infarction, hemorrhage, or mass. Chronic microvascular ischemic changes. No substantial change from recent prior study. MRI total spine 01/04/22 1. Dextroscoliotic curvature of the midthoracic spine. 2. Mild left-sided facet arthropathy at T10-11 and T11-12. No foraminal or canal stenosis at any level within the thoracic spine. 3. Prominence of the posterior epidural fat throughout the thoracic spine, although does not contribute to significant canal stenosis. MRI Brain 12/11/21 IMPRESSION: 1. No acute intracranial pathology. 2. Mild-to-moderate global parenchymal volume loss and chronic white matter microangiopathy. CTH 12/5/22IMPRESSION No acute intracranial process. Vitals:Temp:  [98 ?F (36.7 ?C)-99.1 ?F (37.3 ?C)] 98.2 ?F (36.8 ?C)Pulse:  [85-97] 89Resp:  [16-18] 16BP: (101-110)/(68-75) 109/75NIBP MAP (mmHg):  [79-86] 86SpO2:  [97 %-99 %] 98 % (02/26 0826)Weight: 79.4 kgI/O's: I/O last 3 completed shifts:In: 755 [NG/GT:755]Out: 220 [Stool:220]Gross Totals (Last 24 hours) at 02/16/2022 1213Last data filed at 02/16/2022 1200Intake 1915 ml Output 220 ml Net 1695 ml 24 Hr Imaging: No results found. Medical/Family History:  has no past medical history on file.family history is not on file. Assessment: Danielle [name] is a 66 y.o. female previously healthy who had a bowel perforation in November 2022 who required ex lap, post op day 5 started having confusion and has declined/regressed significantly over three months, with 2x brain MRI and a EEG with diffuse slowing, who now seems to have slowly progressed to a state of producing minimal speech and not moving extremities with diffuse pain / allodynia. Neurology is consulted for prolonged AMS. 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 There are subtypes of catatonia, including malignant catatonia that are characterized by fever, tachycardia, autonomic instability but is usually characterized by rigidity which she lacks, but comes on in a fulminant fashion which could fit with the time course of her acute decline on POD5. Other acute neurologic processes such as CNS infectious, inflammatory / demyelinating process or NCSE are highly unlikely given 3 month course during which 2x brain MRI and EEG were non revealing. However if she has carcinomatosis and has some underlying malignancy, paraneoplastic syndrome could be on differential. The methods of working up the central nervous system include MRI, EEG and lumbar puncture and the utility of repeating workup is questionable given fairly stable symptoms. However she has not had a lumbar puncture as of yet. Recommendations: - Neurologic workup that has not been completed that can be considered would be lumbar puncture; send for counts, protein, glucose and autoimmune encephalopathy panel. If concern for carcinomatosis persists would also send for cytology. - Can defer repeating neuro axis imaging and EEG for now - Please recheck reversible dementia / AMS labs: TSH, B12, B1, folate, MMA, prealbumin - optimize nutrition- delirium precautions - would recommend consulting psychiatry for eval of catatonia Delirium precautions: ?	Provide frequent reorientation, encourage regular communication with family where possible?	Avoid deliriogenic medications where possible (especially benzodiazepines in elderly patients)?	Minimize lines, tubes and restraints as able?	Ensure use of visual aides and/or hearing amplifiers for patients with visual and/or auditory impairments ?	Encourage regular sleep-wake cycle and minimize nighttime interruptions/stimuli (e.g. cluster care, turn off TV)?	Monitor for and address any evidence of untreated pain, constipation or urinary retention?	Mobilize and transfer out of bed to chair as able?	Optimize nutrition and encourage regular meals if/when able to take POClare Elberta Fortis PGY-2 Neurology ResidentAvailable on [facility] you for allowing [place] to assist in the care of this patient. Please contact our service for further information or inquiries. Case discussed with General Senior Resident -- final recommendations to follow.

## 2022-02-16 NOTE — Other
I spoke with pt's sister, Danielle [name], who stated that she would be coming to visit on Monday. I d/w her neurology's recommendation of having an LP.  [name] stating that she agrees with and wants to pursue whatever work up is recommended for sister Danielle [name], [place]

## 2022-02-16 NOTE — Other
-  CONSULT  REQUEST  DOCUMENTATION-[facility] NOTE-Type of consult: [facility] Neurology -New Consult: [id number] [name] /Location: 9831/9831-A / *Brief Clinical Question: persistant encephalopathy after a hospitalization for perforated diverticulitis, reportedly was high functioning prior to that, presented here for fever and placement, mental status still off despite abx, are we missing a cause of encephalopathy?/Callback Cell Phone: [phone]** / Please confirm receipt of this message by texting back ?OK?-1 - Mobile Heartbeat message sent to [facility] at 12:01 PM. Received response at 1201.Casimiro Needle Hudd2/26/202312:00 [employer] [phone]

## 2022-02-17 ENCOUNTER — Encounter: Admit: 2022-02-17 | Payer: PRIVATE HEALTH INSURANCE | Primary: Internal Medicine

## 2022-02-17 ENCOUNTER — Ambulatory Visit: Admit: 2022-02-17 | Payer: PRIVATE HEALTH INSURANCE | Primary: Internal Medicine

## 2022-02-17 LAB — COMPREHENSIVE METABOLIC PANEL
BKR A/G RATIO: 0.5 — ABNORMAL LOW (ref 1.0–2.2)
BKR ALANINE AMINOTRANSFERASE (ALT): 109 U/L — ABNORMAL HIGH (ref 10–35)
BKR ALBUMIN: 2.3 g/dL — ABNORMAL LOW (ref 3.6–4.9)
BKR ALKALINE PHOSPHATASE: 148 U/L — ABNORMAL HIGH (ref 9–122)
BKR ANION GAP: 7 (ref 7–17)
BKR ASPARTATE AMINOTRANSFERASE (AST): 79 U/L — ABNORMAL HIGH (ref 10–35)
BKR AST/ALT RATIO: 0.7
BKR BILIRUBIN TOTAL: 0.2 mg/dL (ref <=1.2)
BKR BLOOD UREA NITROGEN: 17 mg/dL (ref 8–23)
BKR BUN / CREAT RATIO: 70.8 — ABNORMAL HIGH (ref 8.0–23.0)
BKR CALCIUM: 9.7 mg/dL (ref 8.8–10.2)
BKR CHLORIDE: 114 mmol/L — ABNORMAL HIGH (ref 98–107)
BKR CO2: 24 mmol/L (ref 20–30)
BKR CREATININE: 0.24 mg/dL — ABNORMAL LOW (ref 0.40–1.30)
BKR EGFR, CREATININE (CKD-EPI 2021): >60 mL/min/{1.73_m2} (ref >=60)
BKR GLOBULIN: 4.7 g/dL — ABNORMAL HIGH (ref 2.3–3.5)
BKR GLUCOSE: 94 mg/dL (ref 70–100)
BKR POTASSIUM: 3.4 mmol/L (ref 3.3–5.3)
BKR PROTEIN TOTAL: 7 g/dL (ref 6.6–8.7)
BKR SODIUM: 145 mmol/L — ABNORMAL HIGH (ref 136–144)

## 2022-02-17 LAB — CBC WITHOUT DIFFERENTIAL
BKR WAM ANALYZER ANC: 2.74 x 1000/ÂµL (ref 2.00–7.60)
BKR WAM HEMATOCRIT (2 DEC): 29.3 % — ABNORMAL LOW (ref 35.00–45.00)
BKR WAM HEMOGLOBIN: 8.7 g/dL — ABNORMAL LOW (ref 11.7–15.5)
BKR WAM MCH (PG): 26.1 pg — ABNORMAL LOW (ref 27.0–33.0)
BKR WAM MCHC: 29.7 g/dL — ABNORMAL LOW (ref 31.0–36.0)
BKR WAM MCV: 88 fL (ref 80.0–100.0)
BKR WAM MPV: 10.4 fL (ref 8.0–12.0)
BKR WAM PLATELETS: 259 x1000/ÂµL (ref 150–420)
BKR WAM RDW-CV: 19.2 % — ABNORMAL HIGH (ref 11.0–15.0)
BKR WAM RED BLOOD CELL COUNT.: 3.33 M/ÂµL — ABNORMAL LOW (ref 4.00–6.00)
BKR WAM WHITE BLOOD CELL COUNT: 6.2 x1000/ÂµL (ref 4.0–11.0)

## 2022-02-17 LAB — PREALBUMIN: BKR PREALBUMIN: 7.7 mg/dL — ABNORMAL LOW (ref 20.0–40.0)

## 2022-02-17 LAB — METHYLMALONIC ACID: BKR METHYLMALONIC ACID: 0.28 umol/L (ref 0.00–0.40)

## 2022-02-17 LAB — VITAMIN B12: BKR VITAMIN B12: 697 pg/mL (ref 232–1245)

## 2022-02-17 LAB — BLOOD CULTURE   (BH GH L LMW YH)
BKR BLOOD CULTURE: NO GROWTH
BKR BLOOD CULTURE: NO GROWTH

## 2022-02-17 LAB — SMOOTH MUSCLE (F-ACTIN) AB, IGG: BKR F-ACTIN (SMOOTH MUSCLE) ANTIBODY, IGG  (YH): 33 U — ABNORMAL HIGH (ref <20)

## 2022-02-17 LAB — TSH W/REFLEX TO FT4     (BH GH LMW Q YH): BKR THYROID STIMULATING HORMONE: 1.67 u[IU]/mL

## 2022-02-17 MED ORDER — ENOXAPARIN 80 MG/0.8 ML SUBCUTANEOUS SYRINGE
80 mg/0.8 mL | Freq: Two times a day (BID) | SUBCUTANEOUS | Status: CP
Start: 2022-02-17 — End: ?
  Administered 2022-02-18: 04:00:00 80 mg/0.8 mL via SUBCUTANEOUS

## 2022-02-17 NOTE — Consults
[facility] MEDICINEPsychiatric Inpatient Consult Note2/27/2023HISTORY OF THE PRESENT ILLNESS Referred by: Dr.  Truman Hayward, [facility] complaint/Reason for consult:  Why are you asking me all these questions?Danielle [name] is a 66 y.o., Single, Not Hispanic Or Latina/O/X, female patientPatient Class:  InpatientHistory of Present Illness: Danielle [name] is a 65 year old woman with unknown psychiatric history and medical history of rheumatoid arthritis and metabolic encephalopathy. Patient with recent prolonged hospitalization in [place]. 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 Documents from outside systems in Care Everywhere indicate patient with family history of dementia and Parkinsonism in mother. No evidence of prior psychiatric diagnoses found in Care Everywhere.On arrival for interview, patient lying in bed, regards interviewer, intermittently makes eye contact. Patient with hands resting on blanket on her chest at time of arrival, hypoactive, though with brief periods of small spontaneous movements. Patient with dysarthric speech, smiling at interviewer appropriately at times throughout evaluation. Patient unable to state her current location or date. When asked where she lives, patient states can't believe I forgot. She reports her birthday is [birth year] (inconsistent with chart review, which indicates patient's birthday is [birth year]). Patient able to answer some yes or no questions, with difficulty answering anything that requires a more sophisticated response. Patient states during evaluation it's hot as hell. When asked to stick out her tongue as part of exam, patient asks Why are you asking me all these questions? and then states I have a bad tongue. She denies AH/VH, denies SI. Patient without muscular rigidity, waxy flexibility, or cogwheeling on physical exam.Patient reports that she has seen a psychiatrist in the past, unable to state when or why. She denies history of inpatient psychiatric admission, denies history of prior suicide attempt, denies taking psychotropic medication. She denies drinking alcohol or using drugs.Attempted to contact Jacquenette Shone, patient's sister. Unable to leave voicemail due to mailbox being full.Attempted to contact Joana Reamer, patient's brother. Unable to leave voicemail, recording states number has changed, been disconnected, or is no longer in service.Per patient's RN Paula Compton, patient conversant with her, asking why when told about interventions that are needed (such as turning and repositioning). She reports patient looking at her, following some commands, though states patient seems weak in upper extremities, with difficulty moving upper extremities against gravity. Reports patient cries out in pain with repositioning or movement of lower extremities.Case discussed with patient's RN Paula Compton and with MD Chaar of patient's primary medical team.REVIEW OF SYSTEMS AND ALLERGIES Review of Systems Unable to perform ROS: Mental status change Constitutional: Negative for fever. Neurological: Positive for speech difficulty and weakness. Psychiatric/Behavioral: Positive for confusion and decreased concentration. Negative for suicidal ideas. Allergies:  Allergies as of 02/11/2022 ? (No Known Allergies) OUTPATIENT MEDICATIONS Medications Prior to Admission Medication Sig Dispense Refill Last Dose ? acetaminophen (TYLENOL) 160 mg/5 mL elixir 500 mg by Per G Tube route every 6 (six) hours as needed.    ? acetaminophen (TYLENOL) 500 mg tablet Give 2 tablets via PEG-tube every 6 hours as needed    ? apixaban (ELIQUIS) 5 mg tablet 1 tablet (5 mg total) by Per G Tube route every 12 (twelve) hours.    ? chlorhexidine gluconate (PERIDEX) 0.12 % solution Use as directed 15 mLs in the mouth or throat 2 (two) times daily.    ? Elemental iron 60 mg (300 mg Ferrous sulfate) / 5 mL oral liquid Give 3.7 mL by g-tube two times daily    ?  folic acid (FOLVITE) 1 mg tablet 1 tablet (1 mg total) by Per G Tube route daily.    ? insulin lispro (ADMELOG, HUMALOG) 100 unit/mL vial Inject under the skin 3 (three) times daily before meals. Use per sliding scale.    ? niacin 50 mg Immediate Release tablet 2 tablets (100 mg total) by Per G Tube route at bedtime.    ? pantoprazole (PROTONIX) 40 mg GrPS oral suspension 1 packet (40 mg total) by Per G Tube route daily.    ? psyllium (METAMUCIL) packet Give 1 packet via g-tube two times daily    ? pyridoxine, vitamin B6, (VITAMIN B-6) 100 mg tablet 1 tablet (100 mg total) by Per G Tube route daily.    ? rosuvastatin (CRESTOR) 10 mg tablet 1 tablet (10 mg total) by Per G Tube route nightly.    ? therapeutic multivitamin liquid 15 mLs by Per G Tube route daily.    ? thiamine (VITAMIN B1) 100 mg tablet 1 tablet (100 mg total) by Per G Tube route 2 (two) times daily.    CURRENT MEDICATIONS Standing MedicationsCurrent Facility-Administered Medications Medication Dose Route Frequency Last Rate ? acetaminophen  650 mg Per G Tube Q6H PRN   ? [Held by provider] apixaban  5 mg Per G Tube Q12H   ? chlorhexidine gluconate  15 mL Mouth/Throat BID   ? dextrose (GLUCOSE) oral liquid 15 g  15 g Oral Q15 MIN PRN    Or ? fruit juice  120 mL Oral Q15 MIN PRN    Or ? skim milk  240 mL Oral Q15 MIN PRN   ? dextrose (GLUCOSE) oral liquid 30 g  30 g Oral Q15 MIN PRN    Or ? fruit juice  240 mL Oral Q15 MIN PRN   ? dextrose injection  12.5 g Intravenous Q15 MIN PRN   ? dextrose injection  25 g Intravenous Q15 MIN PRN   ? Elemental iron  45 mg Per G Tube BID   ? [Held by provider] enoxaparin (LOVENOX) treatment  80 mg Subcutaneous Q12H   ? famotidine  20 mg Per G Tube Daily   ? folic acid  1 mg Per G Tube Daily   ? glucagon  1 mg Intramuscular Once PRN   ? niacin  100 mg Per G Tube Nightly   ? pyridoxine (vitamin B6)  100 mg Per G Tube Daily   ? [Held by provider] rosuvastatin  10 mg Per G Tube Nightly   ? sodium chloride  3 mL IV Push Q8H   ? sodium chloride  3 mL IV Push PRN for Line Care   ? thiamine  100 mg Per G Tube BID   Infusion/IV MedicationsPRN Medicationsacetaminophen, dextrose (GLUCOSE) oral liquid 15 g **OR** fruit juice **OR** skim milk, dextrose (GLUCOSE) oral liquid 30 g **OR** fruit juice, dextrose injection, dextrose injection, glucagon, sodium chlorideHEALTH ISSUES/MEDICAL HISTORY No past medical history on file. OB History No obstetric history on file.  No past surgical history on file.  PAST PSYCHIATRIC HISTORY Psychiatric Treatment History No data recordedSOCIAL & SUBSTANCE ABUSE HISTORY Social History Socioeconomic History ? Marital status: Single Social Determinants of Health Housing Stability: High Risk ? Housing Stability: I do not have a steady place to live (temporarily staying with others, in a hotel, in a shelter, living outside on the street, on a beach, in a car, abandoned building, bus or train station, or in a park) (If you would like to add more detailed Developmental  and Educational information, you can utilizethe Smartphrase PSYSOCHXADULT or PSYSOCHXPED within the History/Social Note section of thepatients chart to save it at the patient level, or add it directly here within this evaluation)FAMILY HISTORY No family history on file.MENTAL STATUS EXAM Temp: 98.1 ?F (36.7 ?C)Pulse: (!) 101Resp: 20BP: 110/76SpO2: 99 %General AppearanceHabitus was medium. Height was tall. Grooming was fair.  MusculoskeletalStrength was diminished. Posture was impaired. had no muscular rigidity, was not waxy, no cogwheeling. Psychiatric ExaminationAttitude: cooperative intermittent eye contactPsychomotor Behavior: hypoactiveSpeech: Rate: normal  Volume: normal  [place]: was dysarthric.Mood: Patient reported mood: OkayAffect: Affective Range: blunted  Affective Intensity: subdued Thought Process: concrete/impoverishedAssociations: normal Thought Content: had no auditory hallucinations, had no visual hallucinationsSuicidal Ideation: no current suicidal ideation, plan or intent Violent Ideation: no current violent/homicidal ideation, intent or plan Insight: poor Judgment: poor Cognitive EvaluationSensorium: alert Orientation: oriented to person Attention and Concentration:  impairedMemory: immediate recall impaired, remote memory impairedLanguage: had expressive difficultiesFund of Knowledge: unable to assess  02/17/22 1430 Clinical Measures - Adult Adult Clinical Measures Bush Francis Catatonia Scale Bush-Francis Catatonia Rating Scale 1. Immobility/stupor: Extreme hypoactivity, immobile, minimally responsive to stimuli. 1 2. Mutism: Verbally unresponsive or minimally responsive. 1(1 toward beginning of evaluation, later scoring 0) 3. Staring: Fixed gaze, little or no visual scanning of environment, decreased blinking. 1 4. Posturing/catalepsy: Spontaneous maintenance of posture (s), including mundane (e.g. sitting or standing for long periods without reacting). 2(patient sitting/lying for long periods without much movement, though tracks/regards, still responds to examiner and things in environment from time to time) 5. Grimacing: Maintenance of odd facial expressions. 0 6. Echopraxia/echolalia: Mimicking of examiner's movements (echopraxia) or speech (echolalia). 0 7. Stereotypy: Repetitive, non-goal-directed motor activity (e.g. finger-play, repeatedly touching, patting or rubbing self); abnormality not inherent in act but in its frequency. 0 8. Mannerisms: Odd, purposeful movements (hopping or walking tiptoe, saluting passers-by or exaggerated caricatures of mundane movements); abnormality inherent in act itself. 0 9. Stereotyped & meaningless repetition of words & phrases (verbigeration): Repetition of phrases or sentences (like a scratched records). 0 10. Rigidity: Maintenance of a rigid position despite efforts to be moved (exclude if cog-wheeling or tremor present) 0(in upper extremities) 11. Negativism: Apparently motiveless resistance to instructions or attempts to move/examine patients. Contrary behavior, does exact opposite of instruction. 0 12. Waxy flexibility: During repositioning of patient, patient offers initial resistance before allowing him/herself to be repositioned, similar to that of a bending candle. 0 13. Withdrawal: Refusal to eat, drink and/or make eye contact. 0(patient with PEG tube, makes intermittent eye contact throughout evaluation) 15. Impulsivity: Patient suddenly engages in inappropriate behavior (e.g. runs down hallway, starts screaming or takes off clothes) without provocation. Afterwards can give no, or only a facile explanation. 0 17. Passive Obedience (mitgehen): Patient raises arm in response to light pressure of finger, despite instructions to the contrary. 0 18. Muscle Resistance (gegenhalten): Involuntary resistance to passive movement of a limb to a new position. Resistance increases with the speed of the movement. 0 19. Motorically Stuck (ambitendency): Patient appears stuck in indecisive, hesitant motor movements. 0 20. Grasp reflex: Striking the patient?s open palm with two extended fingers of the examiner?s hand results in automatic closure of patients hand. 0 21. Perseveration: Repeatedly returns to same topic or persists with the same movements. 0 22. Combativeness: Belligerence or aggression, Usually in an undirected manner, without explanation. 0 23. Autonomic abnormality: Abnormality of body temperature (fever), blood pressure, pulse, respiratory rate, inappropriate sweating, flushing. 1 Bush-Francis Catatonia Rating Scale - Severity Score (Number of  points for items 1 -23) 6   SAFETY AND RISK ASSESSMENT PSY RISK ASSESSMENT SAFE-T WITH C-SSRS  Reason for Assessment:  Consultation on Medical/Surgical PatientC-SSRS: Suicidal Ideation:  Since Last Assessment  WISH TO BE DEAD:  No  SUICIDAL THOUGHTS:  No  Have you ever done anything, started to do anything or prepared to do anything to end your life?:  No (per patient report, though patient is a limited historian)Risk Assessment:   Access to Lethal Methods:  Unable to AssessCurrent and Past Psychiatric Diagnoses:       Unable to Assess  Presenting Symptoms:     Unable to Assess  Family History:      Unable to Assess  Precipitants / Stressors:      Unable to Assess  Change in Treatment:       Unable to AssessProtective Factors:   Internal:      Unable to Assess  External:      Unable to AssessRisk to Others:   Current Agitation:  NoWithdrawal Risk:      Alcohol/Benzodiazepine/Barbiturate Risk for Withdrawal:  Low (patient has been in controlled environments for last several months)     Opioid Risk for Withdrawal:  Low (patient has been in controlled environments for last several months)LABORATORY/DIAGNOSTIC RESULTS Recent Labs Lab 02/21/230418 02/21/230424 02/25/230531 02/26/230455 02/27/230457 WBC 8.8   < > 6.1 6.3 6.2 HGB 11.9   < > 9.5* 9.2* 8.7* HCT 40.50   < > 32.60* 30.50* 29.30* MCV 88.4   < > 90.1 87.6 88.0 PLT 561*   < > 312 289 259 NEUTROPHILS 74.1*  --  57.0  --   --   < > = values in this interval not displayed. Recent Labs Lab 02/24/230509 02/24/231144 02/25/230531 02/25/230845 02/26/230455 02/26/230853 02/27/230457 02/27/230837 02/27/231303 NA 149*  --  146*  --  144  --  145*  --   --  K 3.2*  --  3.8  --  3.6  --  3.4  --   --  CL 114*  --  113*  --  113*  --  114*  --   --  CO2 26  --  25  --  23  --  24  --   --  BUN 23  --  18  --  15  --  17  --   --  CREATININE 0.36*  --  0.34*  --  0.30*  --  0.24*  --   --  GLU 106*   < > 95   < > 99   < > 94   < > 105* ANIONGAP 9  --  8  --  8  --  7  --   --  CALCIUM 10.3*  --  9.7  --  9.7  --  9.7  --   --  MG 2.0  --   --   --   --   --   --   --   --   < > = values in this interval not displayed.  Latest Reference Range & Units 02/11/22 06:40 Heart Rate bpm 99 QRS Interval ms 78 QTC Interval ms 474 P Axis deg 54 QRS Axis deg -32 T Wave Axis deg 85 P-R Interval msec 127  Latest Reference Range & Units 02/17/22 04:57 Thyroid Stimulating Hormone, 3rd Gen. See Comment ?IU/mL 1.670  Latest Reference Range & Units 02/12/22 06:44 Manual Microscopic  Performed RBC/HPF 0 - 2 /HPF 6-10 ! WBC/HPF 0 - 5 /  HPF 3-5 Color, UA Yellow, Colorless  Yellow Clarity, UA Clear  Cloudy ! Specific Gravity, UA 1.005 - 1.030  1.025 pH, UA 5.5 - 7.5  6.5 Protein, UA Negative, Trace  1+ ! Glucose, UA Negative  Negative Ketones, UA Negative  Negative Blood, UA Negative  Negative Bilirubin, UA Negative  Negative Leukocytes, UA Negative  Negative Nitrite, UA Negative  Negative Hyaline Casts, UA 0 - 3 /LPF 1-3 Granular Casts, UA None /LPF 4-10 ! Waxy Casts, UA None /LPF 4-10 ! Trans Epithelial Cells, UA None /HPF Rare ! Bacteria, UA None-Rare /HPF Few ! Urobilinogen, UA <=2.0 mg/dL <1.6 !: Data is abnormalCT HEAD WO IV CONTRAST performed on Date:  02/12/2022 5:30 PM?INDICATION: Mental status change, unknown cause.?COMPARISON: CTA head and neck 12/13/2018.?TECHNIQUE:?Serial [place] axial images were obtained from the skull base to the vertex without the administration of intravenous contrast.  Coronal and sagittal reconstructions are provided.?Technical limitations: None?FINDINGS:?Patchy periventricular and subcortical white matter hypodensity is nonspecific, but likely related to chronic small vessel ischemia. ?Mildly dilated ventricle but not disproportionate to diffuse cerebral parenchymal volume loss, which has increased since the prior study of 12/13/2018, with bicaudate lateral ventricular diameter increasing from 30 to 40 mm. The callosal angle is 73 degrees. Sulci at the vertex are not markedly dilated but there is at the sylvian fissure and inferiorly are more moderately dilated.?Gray-white matter differentiation is intact without large acute infarct.?There is no intracranial hemorrhage. There is no mass effect, edema or midline shift. No extra-axial fluid collection is seen. Stable appearing ovoid shaped dural based extra-axial calcification along the left frontal convexity, may represent dural calcification versus small calcified meningioma, appears stable.No fractures or aggressive bone lesions.?Ovoid shaped right orbital globe configuration similar to prior study.There is no significant paranasal sinus mucosal thickening.The mastoid air cells are clear.?IMPRESSION:Since the prior exam, there has been a significant increase in ventricular size and sylvian fissures. In the appropriate clinical setting normal pressure hydrocephalus can be considered.?Report Initiated By:  Consuela Mimes, MD?Reported And Signed By: Cherre Blanc, MD  [facility] Radiology and Biomedical ImagingRecent laboratory results reviewed. IMPRESSION Psychiatric Diagnosis:Altered Mental StatusR/O Major neurocognitive disorderR/O Normal Pressure HydrocephalusMs. [name] is a 66 year old woman with unknown psychiatric history and medical history of rheumatoid arthritis and metabolic encephalopathy. Patient with recent prolonged hospitalization in [place]. 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 On evaluation, patient engaging as she can with evaluation, though unable to move upper extremities much, even when asked to do so. She is a poor historian, answers mostly yes or no questions initially, later with increased spontaneous speech, at times making comments on things in her environment, such as the room being warm. 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 Would obtain ammonia level, given increased liver enzymes and possibility of this predisposing her to altered mental status, and would also carefully rule out normal pressure hydrocephalus, given increased ventricle size on imaging from studies in recent years. Appreciate continued medical workup for possible causes of patient's current presentation. Psychiatry will sign off at this time, as discussed with primary medical team. Please re-consult for further collaboration, if needed.Active Hospital Problems  Diagnosis ? Principal Problem/Diagnosis:  Fever, unspecified fever cause [R50.9]  PLAN - Patient does not require sitter; does not meet criteria for inpatient psychiatric admission- Agree with  continued B vitamin supplementation- Please obtain vitamin D, treponema pallidum, HIV and ammonia - Recommend delirium precautions, as below- Psychiatry will sign off, primary team aware and in agreement. Please re-consult for further collaboration, if needed.Non Pharmacologic Measures for Delirium Treatment/Prevention: --For agitation, non-pharmacological management of such behaviors is preferred, including identifying and examining context of behavior (eg, is it harmful to patient or others?) and environmental triggers (eg, overstimulation, unfamiliar surroundings, frustrating interactions).  ?	Redirection, reassurance, and distraction?	Do not force care but re-approach at later time, keep safe distance if combative. ?	Explore modifications that could prevent overstimulation (e.g. avoiding overnight vitals unless necessary, cohorting medication administration, etc.)?	Offer simple instructions for meals and bathing and attempt to provide a stable daily routine--Encourage normalized sleep/wake cycle: ?	Provide calm environment with reduced background noise and stimulating activities at night?	Exposure to light during the day?	Limit daytime napping ?	Limit TV before sleep ?	Provide opportunities for physical exercise--Nonessential catheters such as foleys, nasogastric tubes, or multiple intravenous access lines should be avoided as their presence is linked to an increased risk of delirium. --While pain is a common precipitant of delirium, opioids may also contribute to the development of delirium if given either in inadequate amounts to control pain or in excess to analgesic needs; please give opioids judiciously.--Continue to meet patient?s physical needs, including addressing pain, constipation, and other physical discomfort; monitor and support PO intake--Early-mobilization to allow for daily ambulation and range of motion; OOB as tolerated--Specific care should be taken to avoid dehydration or hypovolemia which exacerbates delirium. --Please avoid deliriogenic drugs (such as benzodiazepines or drugs with anticholinergic side effects)--Allow easy access to glasses and/or provide amplifying devices and other hearing aids. Electronically SignedProvider:  Abran Richard, APRN, PMHNP-BCBehavioral Intervention Team (Available M-F 8:00AM - 4:30PM)[facility] [place] [facility] Units: 4-6, 6-7, 7-5, 8-8, 10-6, 10-7BIT [facility] [employer] Units: 5-5, 9-5, 9-7Contact via [employer] (M-F 8AM-4:30PM)Ph: [phone] (department)For Psychiatry daytime coverage: Please check Psychiatry Dynamic Roles in [facility].After 4:30pm & Weekends/Holidays: Please contact Psychiatry On Call [facility] Dynamic Role in [facility].February 27, 20231:48 PM

## 2022-02-17 NOTE — Plan of Care
Inpatient Physical Therapy Progress Note IP Adult PT Eval/Treat - 02/17/22 0946    Date of Visit / Treatment  Date of Visit / Treatment 02/17/22   Note Type Progress Note   End Time 946    General Information  Subjective mostly nonverbal throughout session   General Observations supine in bed, session cleared with RN prior   Precautions/Limitations Fall Precautions;Bed alarm;Chair alarm    Vital Signs and Orthostatic Vital Signs  Vital Signs Vital Signs Stable    Pain/Comfort  Pain Comment (Pre/Post Treatment Pain) c/o B LE pain with movement, did not quantify, resolved with positioning to comfort post    Musculoskeletal  LLE Muscle Strength Grading 2-->active movement with gravity eliminated   RLE Muscle Strength Grading 2-->active movement with gravity eliminated    Skin Assessment  Skin Assessment See Nursing Documentation    Bed Mobility  Supine-to-Sit Independence/Assistance Level Moderate assist;Assist of 2   Supine-to-Sit Assist Device Hand held assist   Sit-to-Supine Independence/Assistance Level Moderate assist;Assist of 2   Sit-to-Supine Assist Device Hand held assist   Bed Mobility Comments EOB sitting maintained with MAx Ax2    Handoff Documentation  Handoff Patient in bed;Bed alarm;Patient instructed to call nursing for mobility;Discussed with nursing    Endurance  Endurance Comments poor    PT- AM-PAC - Basic Mobility Screen- How much help from another person do you currently need.....  Turning from your back to your side while in a a flat bed without using rails? 1 - Total - Requires total assistance or cannot do it at all.   Moving from lying on your back to sitting on the side of a flat bed without using bed rails? 1 - Total - Requires total assistance or cannot do it at all.   Moving to and from a bed to a chair (including a wheelchair)? 1 - Total - Requires total assistance or cannot do it at all.   Standing up from a chair using your arms(e.g., wheelchair or bedside chair)? 1 - Total - Requires total assistance or cannot do it at all.   To walk in a hospital room? 1 - Total - Requires total assistance or cannot do it at all.   Climbing 3-5 steps with a railing? 1 - Total - Requires total assistance or cannot do it at all.   AMPAC Mobility Score 6   TARGET Highest Level of Mobility Mobility Level 2, Turn self in bed/bed activity/dependent transfer     Therapeutic Exercise  Therapeutic Exercise Comments PROM B UE and LE as tolerated    Clinical Impression  Follow up Assessment Pt tolerated treatment fairly with effort limited by pain and fagigue.  Pt required Mod Ax2 for bed mobility and MAx Ax2 to maintain EOB sitting.  PROM to B UE and LE.  Recommend DC to STR when medically cleared with continued PT while in the hospital setting to maximize functional mobility.    Patient/Family Stated Goals  Patient/Family Stated Goal(s) unable to state    Frequency/Equipment Recommendations  PT Frequency 3x per week   What day of week is next treatment expected? Wednesday   3/1   PT Recommendations for Inpatient Admission  Activity/Level of Assist assist of 2;mechanical lift   ADL Recommendations assist of 2   bed level   Planned Treatment / Interventions  Plan for Next Visit progress as tolerated    PT Discharge Summary  Physical Therapy Disposition Recommendation Short Term Rehab

## 2022-02-17 NOTE — Other
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 Received response at 1206.-Larae Caison Dick2/27/202312:05 [facility] [phone]

## 2022-02-17 NOTE — Other
Nursing Skin/Wound ConsultA wound consult was placed on Danielle [name],  a 66 y.o. female.  Subjective: Patient alert, agreeable to wound consult. Complains of pain with any movement of lower extremities. Problems  Hospital Problems Multidisciplinary Problems Principal Problem:  Fever, unspecified fever cause  SNOMED [place](R): FEVER   Active Multi-Disciplinary problems:Adult Inpatient Plan of Care (02/11/22)Infection (02/12/22)Physical Therapy Goals (02/12/22)Occupational Therapy Goals (02/12/22)Impaired Wound Healing (02/13/22)Fall Injury Risk (02/13/22)Violence Risk or Actual (02/14/22)Skin Injury Risk Increased (02/15/22) Allergies No Known Allergies Objective: Nutrition: Nutrition Risk Screen: tube feeding or parenteral nutrition Functional Status:Ambulation: 4 - completely dependent                             Transferring: 4 - completely dependentContinence: Voiding Characteristics: incontinence                     GI Signs/Symptoms: other (see comments) (ileostomy)Braden Risk AssessmentSensory Perception: 3-->slightly limitedMoisture: 3-->occasionally moistActivity: 1-->bedfastMobility: 2-->very limitedNutrition: 3-->adequateFriction and Shear: 2-->potential problemBraden Score: 14Albumin Date Value Ref Range Status 02/17/2022 2.3 (L) 3.6 - 4.9 g/dL Final 16/09/9603 1.8 (L) 3.6 - 4.9 g/dL Final Prealbumin Date Value Ref Range Status 02/17/2022 7.7 (L) 20.0 - 40.0 mg/dL Final  Physical Exam:  02/17/22 1200 Wound 02/13/22 1313 Ulceration Left lower leg C.Danielle [name] 02/17/22 Date First Assessed/Time First Assessed: 02/13/22 1313   Primary Wound Type: Ulceration  Present on Hospital Admission: Yes  Side: Left  Orientation: lower  Location: leg  PU Related to Device?: Yes  PU Device (if related): ? rubbing of waffle boots v... Dressing Appearance dry;intact Drainage Amount none Base pink;red Periwound intact;dry Wound Length (cm) 5 cm Wound Width (cm) 3 cm Wound Depth (cm) 0.1 cm Wound Surface Area (cm^2) 15 cm^2 Wound Volume (cm^3) 1.5 cm^3 Evaluation Unchanged Wound Cleanse and Irrigate cleansed with;sterile normal saline Dressing Care petroleum-based;foam Wound 02/13/22 Ulceration Right lateral foot C. Danielle [name] 02/17/22 Date First Assessed: 02/13/22   Primary Wound Type: Ulceration  Present on Hospital Admission: Yes  Side: Right  Orientation: lateral  Location: foot  PU Related to Device?: No  Comments: C. Tarah Buboltz 02/17/22 Dressing Appearance open to air Drainage Amount none Base black;eschar;purple Periwound intact Wound Length (cm) 2 cm Wound Width (cm) 3 cm Wound Depth (cm) 0 cm Wound Surface Area (cm^2) 6 cm^2 Wound Volume (cm^3) 0 cm^3 Evaluation Unchanged Wound Cleanse and Irrigate cleansed with;soap and water Dressing Care open to air;other (see comments)(Betadine) Pressure Injury Stage U Wound 02/17/22 1246 Moisture Associated Dermatitis Left medial abdomen Under PEG tube, C.Danielle [name] 02/17/22 Date First Assessed/Time First Assessed: 02/17/22 1246   Primary Wound Type: Moisture Associated Dermatitis  Present on Hospital Admission: No  Side: Left  Orientation: medial  Location: abdomen  Comments: Under PEG tube, C.Danielle [name] 02/17/22 Dressing Appearance moist drainage Drainage Amount small Drainage Characteristics/Odor tan;brown Base red;pink Periwound excoriated Evaluation Unchanged Wound Cleanse and Irrigate cleansed with;soap and water Dressing Care skin barrier agent applied Wound 02/17/22 1253 Ulceration Left medial foot Bunion, C. Danielle [name] 02/17/22 Date First Assessed/Time First Assessed: 02/17/22 1253   Primary Wound Type: Ulceration  Present on Hospital Admission: No  Side: Left  Orientation: medial  Location: foot  Comments: Bunion, C. Danielle [name] 02/17/22 Dressing Appearance open to air Drainage Amount none Base purple;nonblanchable;maroon Periwound intact Wound Length (cm) 1 cm Wound Width (cm) 1 cm Wound Depth (cm) 0 cm Wound Surface Area (cm^2) 1 cm^2 Wound Volume (cm^3) 0 cm^3 Evaluation Unchanged Wound Cleanse and Irrigate cleansed with;soap and  water Dressing Care skin barrier agent applied Assessment: Patient on a standard mattress with offloading heel boots on. Her PEG tube has a small amount of brown yellow drainage under the flange.The skin is excoriated and red. The tube is note secured with a anchor. She has an open ulceration to the inner aspect of her left lower leg. Documented in the flow sheet as a skin tear, but it lines up to where the waffle boot could be rubbing on her leg. The wound is pink/ red and clean. She has an ulcer to her right lateral foot that is black/ brown eschar. She also has an ulceration to her right bunion that is purple and maroon. 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 She has scattered small purple areas to her feet and lower extremities and would benefit from a vascular consult to rule out arterial/ venous ulcerations due to the unknown etiology of these wounds. Revonda Standard the clinical coordinator came in during the consult and brought a PEG tube anchor. Brown waffle boots changed to blue offloading heel protector boots. Recommendations: Wound Care:Site:Left lower leg ulceration  Cleanse with soap and water Apply Xeroform- petroleum based gauze and cover with foamFrequency: Xeroform should be changed daily. The foam dressing can be changed every 3 days and PRN if soiled.  Site: Right lateral footCleanse with soap and waterApply Povidone iodine to affected area and cover with dry clean dressing Frequency: Daily Site: MASD Under peg tube flange Cleanse with soap and water Apply z-guard and place split drainage gauzeFrequency BID and PRN , please make sure Peg is secured with anchor Site: Left foot, bunion, ulceration  Cleanse with soap and waterApply barrier spray Frequency: DailyPlease make sure patient has blue offloading boots on. Nursing InterventionsLow air loss mattress. Turn patient every 2hrs and PRN - use wedge [id number]  Continue to offload heels at all times. Maintain adequate incontinence care. Use only 1 disposable pad under patient CommunicationFindings and recommendations communicated to Katy Apo  RNFindings and recommendations communicated to [facility]. Recommended vascular/ podiatry consult. SignedCara Rye Dorado RN, MSN, Wound TeamMHB [phone] 2/27/20231:55 PM

## 2022-02-17 NOTE — Plan of Care
Plan of Care Overview/ Patient Status    Assumed care at 19:30. Pt A+Ox1, RA, BP slightly soft at MN. Pt received 9pm bolus TF pivot 1.5, 360cc. Pt tolerated flushes and TF overnight. Good output from ileostomy and adequate UOP. Pt had tan and brown drainage from PEG, dressing replaced twice. Turn q.2. Falls precautions maintained. Problem: Adult Inpatient Plan of CareGoal: Plan of Care ReviewOutcome: Interventions implemented as appropriateGoal: Patient-Specific Goal (Individualized)Outcome: Interventions implemented as appropriateGoal: Absence of Hospital-Acquired Illness or InjuryOutcome: Interventions implemented as appropriateGoal: Optimal Comfort and WellbeingOutcome: Interventions implemented as appropriateGoal: Readiness for Transition of CareOutcome: Interventions implemented as appropriate Problem: InfectionGoal: Absence of Infection Signs and SymptomsOutcome: Interventions implemented as appropriate Problem: Impaired Wound HealingGoal: Optimal Wound HealingOutcome: Interventions implemented as appropriate Problem: Fall Injury RiskGoal: Absence of Fall and Fall-Related InjuryOutcome: Interventions implemented as appropriate Problem: Violence Risk or ActualGoal: Anger and Impulse ControlOutcome: Interventions implemented as appropriate Problem: Skin Injury Risk IncreasedGoal: Skin Health and IntegrityOutcome: Interventions implemented as appropriate

## 2022-02-17 NOTE — Plan of Care
Adult Speech and Language PathologyMBS Evaluation Attempt2/27/2023Patient Name:  Danielle MaloneMR#:  [name] Date of Birth:  23-Dec-1957Therapist:  Doloris Hall, SLP SLP IP Adult General Information - 02/17/22 1404    General Information  Subjective I'm not doing it.   Medical Diagnosis Per chart: 21F previously healthy with recent prolonged hospital course in [place] from 10/2021 to 01/2022 for diverticulitis c/b bowel perforation s/p s/p hemicolectomy/ileostomy with c/c/b persistent altered mental status and functional decline beginning POD5 with negative MRI imaging of total neuraxis and negative EEG with work-up only notable for folate deficiency for which she was started on folate and thiamine. OSH course also c/b surgical incision abscess s/p drainage, TPN placement iso difficult family dynamics and complex GOC with ethics involvement discontinued following staph epi bacteremia and UTI s/p PEG placement, and DVT started on Eliquis. 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 Neurology consulted for prolonged AMS.   Pertinent History of Current Problem Patient seen for FEES attempt this admission on 2/24. No trials of PO observed. Recommendation at that time remained NPO.   Primary Concern Swallow function.   Prior Level of Functioning Patient w/ PEG from outside hospital, however, no documented history of swallow evaluation/ dysphagia found in EMR prior to current admit.      SLP IP Adult Pain/Comfort - 02/17/22 1404    Pain/Comfort  Pain Comment (Pre/Post Treatment Pain) no c/o pain   Pain Rating at Rest 0/10 - no pain      SLP Modified Barium Swallow - 02/17/22 1404    Modified Barium Swallow  Type of Study Initial Modified Barium Swallow   Reason for Study concern for aspiration risk   Patients Current Diet NPO   w/ TFs via PEG  Patient should be NPO Yes, with strict oral care   Aspiration Precautions yes   Other (Comments) Patient seen for modified barium swallow study/ MBS. Patient w/ adamant refusal to participate in instrumental evaluation despite max education/ encouragement from this clinician. Team made aware. Study terminated. No trials of PO observed.See recommendations below.      SLP IP Adult Recommendations - 02/17/22 1404    Recommendations  SLP Therapy Frequency No follow up necessary      SLP IP Adult Inpatient Recommendations - 02/17/22 1404    SLP Recommendations for Inpatient Admission  Swallow Recommendations REC:NPO. All nutrition/ meds via PEG. Strict daily oral care. SLP to sign off. Please reconsult w/ improvement in participation for instrumental swallow evaluation.      SLP IP Adult Discharge Summary - 02/17/22 1404    Discharge Summary  Disposition Recommendation(s) No follow up therapy needs     Doloris Hall, M.S., CF-SLPMHB: [phone]/27/2023

## 2022-02-17 NOTE — Progress Notes
[facility] Progress NotePatient: Danielle [name], a 66 y.o. femaleAttending Provider: Truman Hayward, MDAdmission Date: 2/21/2023Hospital Day: 6Subjective: Interim History: Patient was seen and examined this morning, no acute issues overnight. LP not done today as patient received full dose lovenox this AM. Will plan for tomorrow AM after holding the morning dose. Review of Hx/Allergies/Meds: I have reviewed the patient's: current scheduled medications and current prn medicationsPMH: No past medical history on file. PSH: No past surgical history on file. Allergies: has No Known Allergies. Current Medications:Scheduled: Current Facility-Administered Medications Medication Dose Route Frequency Provider Last Rate Last Admin ? [Held by provider] apixaban (ELIQUIS) tablet 5 mg  5 mg Per G Tube Q12H Littie Deeds, MD     ? chlorhexidine gluconate (PERIDEX) 0.12 % solution 15 mL  15 mL Mouth/Throat BID Dorisann Frames, MD   15 mL at 02/17/22 1610 ? Elemental iron 60 mg (300 mg Ferrous sulfate) / 5 mL oral liquid 45 mg  45 mg Per G Tube BID Dorisann Frames, MD   45 mg at 02/17/22 9604 ? enoxaparin (LOVENOX) injection 80 mg  80 mg Subcutaneous Q12H Carney, Walker Shadow, PA   80 mg at 02/17/22 0915 ? famotidine (PEPCID) suspension (8 mg/mL) 20 mg  20 mg Per G Tube Daily Dorisann Frames, MD   20 mg at 02/17/22 5409 ? folic acid (FOLVITE) tablet 1 mg  1 mg Per G Tube Daily Dorisann Frames, MD   1 mg at 02/17/22 8119 ? niacin Immediate Release tablet 100 mg  100 mg Per G Tube Nightly Dorisann Frames, MD   100 mg at 02/16/22 2159 ? pyridoxine (vitamin B6) (B-6) tablet 100 mg  100 mg Per G Tube Daily Dorisann Frames, MD   100 mg at 02/17/22 1478 ? [Held by provider] rosuvastatin (CRESTOR) tablet 10 mg  10 mg Per G Tube Nightly Dorisann Frames, MD   10 mg at 02/14/22 2245 ? sodium chloride 0.9 % flush 3 mL  3 mL IV Push Q8H Dorisann Frames, MD   3 mL at 02/17/22 1314 ? thiamine (VITAMIN B1) tablet 100 mg  100 mg Per G Tube BID Dorisann Frames, MD   100 mg at 02/17/22 0853  Continuous Infusions:  PRN: acetaminophen, dextrose (GLUCOSE) oral liquid 15 g **OR** fruit juice **OR** skim milk, dextrose (GLUCOSE) oral liquid 30 g **OR** fruit juice, dextrose injection, dextrose injection, glucagon, sodium chloride Objective: I have reviewed the patient's current vital signs as documented in the patient's EMR.Vitals (Last 24 Hrs):Temp:  [97.9 ?F (36.6 ?C)-98.3 ?F (36.8 ?C)] 97.9 ?F (36.6 ?C)Pulse:  [87-101] 96Resp:  [16-20] 20BP: (87-112)/(52-76) 105/70SpO2:  [98 %-100 %] 99 %Device (Oxygen Therapy): room air on RAWt Readings from Last 3 Encounters: 02/14/22 79.4 kg 12/13/18 98.6 kg  I/O's (Last 24 Hrs):Gross Totals (Last 24 hours) at 02/17/2022 1736Last data filed at 02/17/2022 1300Intake 1634 ml Output 550 ml Net 1084 ml  Procedures:None Physical Exam: General Impression: Alert and oriented x2, not in acute distressHEENT: Normocephalic atraumatic, extra-ocular movements intactCardiovascular: Heart regular rate and rhythm, S1&S2 audibleChest: Lungs clear to auscultation bilaterally, no rhonchi, no wheezeAbdomen: Bowel sounds present, abdomen soft, non-tenderMusculoskeletal: Mild LE edemaNeurological: Answers simple questions, follows commands, moves both upper extremities spontaneously and LE to painful stimuli Labs: CBCRecent Labs Lab 02/25/230531 02/26/230455 02/27/230457 WBC 6.1 6.3 6.2 HGB 9.5* 9.2* 8.7* HCT 32.60* 30.50* 29.30* MCV 90.1 87.6 88.0 PLT 312  289 259 BMPRecent Labs Lab 02/21/230418 02/21/230424 02/24/230509 02/25/230531 02/26/230455 02/27/230457 NA  --    < > 149* 146* 144 145* K  --    < > 3.2* 3.8 3.6 3.4 CL 109*   < > 114* 113* 113* 114* CO2 23   < > 26 25 23 24  ANIONGAP  --    < > 9 8 8 7  BUN 17   < > 23 18 15 17  CREATININE 0.35*   < > 0.36* 0.34* 0.30* 0.24* CALCIUM 12.0*   < > 10.3* 9.7 9.7 9.7 MG 2.2  --  2.0  --   --   --   < > = values in this interval not displayed.  LFTsRecent Labs Lab 02/21/230418 02/21/230418 02/24/230509 02/25/230531 02/26/230455 02/27/230457 ALT 35  --  189* 147* 136* 109* AST  --    < > 200* 135* 141* 79* ALKPHOS 108  --  103 124* 138* 148* BILITOT <0.2  --  0.4 0.3 0.3 0.2 BILIDIR  --   --  0.2 <0.2  --   --  ALBUMIN 2.7*  --  2.2* 2.1* 1.8* 2.3*  < > = values in this interval not displayed. CoagsRecent Labs Lab 02/25/230531 02/26/230455 INR 1.15 1.16 PTT 49.8*  --   GlucoseRecent Labs Lab 02/26/231053 02/26/231638 02/26/232041 02/27/230457 02/27/230837 02/27/231303 GLU 115* 105* 130* 94 93 105*  A1CNo results found for: HGBA1C Diagnostics: Imaging:No new radiology.Echo:No results found for this or any previous visit.ECG/Tele Events: No ECG ordered todayAssessment: 66 y.o. 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 ID consulted to help with source of infection, improving on IV abx.  Course c/b G-tube leaking, hypernatremia, transaminitis. Plan: # Acute encephalopathy- Work up so far unrevealing- Plan for LP tomorrow AM, should hold lovenox dose in the morning. 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 Will hold tomorrow's dose pending LP- Will need to clarify plan for ?peritoneal nodules biopsy to decide on AC dosingDVT ppx: LovenoxDiet: Tube feedsMedical decision making for this patient was high.I spent 50 minutes today on this encounter before, during and after the visit, reviewing labs and records, evaluating and examining the patient, entering orders and documenting the visit. Electronically Signed:Edilberto Roosevelt, MDHospitalist Attending PhysicianYale The Oregon Clinic York StreetNew [place], [place] [id number]/27/20235:36 PMDISCLAIMER: This chart was created using M-Modal dictation software. Efforts were made by me to ensure accuracy, however some errors may be present due to limitations of this technology and occasionally words are not transcribed as intended.

## 2022-02-17 NOTE — Progress Notes
Inpatient Occupational Therapy Progress NoteDefault Flowsheet Data (most recent)   IP Adult OT Eval/Treat - 02/17/22 1439    Date of Visit / Treatment  Date of Visit / Treatment 02/17/22   Note Type Evaluation   Progress Report Due 02/26/22   End Time 1439    General Information  Pertinent History Of Current Problem Per chart: 66 y.o. 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  Was deemed to lack capaciity, however required ethics consult as family difficult to contact for medical decision making, s/p PEG placement at that time.  Course further c/b b/l DVT, started on AC.  Now presented to [facility] for placement as family unable to care for her, found to be febrile/tachycardic   Precautions/Limitations Fall Precautions    Vital Signs and Orthostatic Vital Signs  Vital Signs Vital Signs Stable    Pain/Comfort  Pain Rating at Rest 0/10 - no pain    Cognition  Orientation Level Oriented to person   Level of Consciousness lethargic   Following Commands Unable to follow any commands   Personal Safety / Judgment Fall risk   Attention Sustained impairment    Musculoskeletal  LUE Muscle Strength Grading 2-->active movement with gravity eliminated   RUE Muscle Strength Grading 2-->active movement with gravity eliminated    Muscle Tone  Muscle Tone Testing Results No muscle tone deficits noted    Coordination  Opposition Impaired bilaterally    Sensory Assessment  Sensory Tests Results No sensory impairment noted    Skin Assessment  Skin Assessment See Nursing Documentation     AM-PAC - Daily Activity IP Short Form  Help needed from another person putting on/taking off regular lower body clothing 1 - Unable   Help needed from another person for bathing (incl. washing, rinsing, drying) 1 - Unable   Help needed from another person for toileting (incl. using toilet, bedpan, urinal) 1 - Unable   Help needed from another person putting on/taking off regular upper body clothing 1 - Unable   Help needed from another person taking care of personal grooming such as brushing teeth 1 - Unable   Help needed from another person eating meals 1 - Unable   AM-PAC Daily Activity Raw Score (Total of rows above) 6   CMS Score (based on Raw Score - with G Code) 6 - 100% impaired     (G Code - CN)    Bed Mobility  Supine-to-Sit Independence/Assistance Level Moderate assist;Assist of 2   Supine-to-Sit Assist Device Hand held assist   Sit-to-Supine Independence/Assistance Level Moderate assist;Assist of 2   Sit-to-Supine Assist Device Hand held assist    Handoff Documentation  Handoff Patient in bed;Discussed with nursing    Endurance  Endurance Comments Poor    Clinical Impression  Follow up Assessment Pyt tolerating EOB activities and first step of mobility progression   Impairments Found (describe specific impairments) attention;BADL's    Frequency/Equipment Recommendations  OT Frequency 2x per week   What day of week is next treatment expected? Wednesday   3/1  OT/OTA completing this assessment GT    OT Discharge Summary  Disposition Recommendation Short Term Rehab

## 2022-02-17 NOTE — Plan of Care
Plan of Care Overview/ Patient Status    1900-0700. VSS on room air. A&Ox1. No indication of pain. Incontinent of B&B, purewick in place. Pills crushed through G tube. Bolus feed of PIVOT through the G tube. Area around the G tube irritated and red, provider notified, wound consult placed. Ileostomy draining brown output. No oral intake. Safety maintained, bed alarm on.

## 2022-02-17 NOTE — Progress Notes
Brief Hepatology NoteCrystal Gelles is a 66 y.o. female with history of rheumatoid arthritis, previous prolonged hostapilization for perforated diverticulitis and large volume pneumonperitoneun s/p ex lap and R hemicolectomy/ileostomy in November who presents to [facility] for LTAC placement. Hepatology was consulted for abnormal LFTs. - LFTs have been improving off of antibiotics- Noted IgG significantly elevatedPlan:- Please check IgG subtypes- Please check SPEP/UPEP- f/u remaining serologies- r/u [place] with dopplerDiscussed with Dr. Wilfrid Lund. [name], MDDigestive Diseases Fellow

## 2022-02-17 NOTE — Other
BRIEF NEUROLOGY NOTE: INTERVAL HISTORY: - Unchanged mental status- Pending LP, EEG, and psychiatry evaluation EXAM: Temp:  [97.4 ?F (36.3 ?C)-98.3 ?F (36.8 ?C)] 98 ?F (36.7 ?C)Pulse:  [87-100] 87Resp:  [16-18] 18BP: (94-112)/(61-75) 112/75SpO2:  [98 %-100 %] 100 %Device (Oxygen Therapy): room airNeuro: MS: ?	Sleeping wakes easily to voice?	Not making eye contact  ?	After repeated prompting looked me in the eye and firmly stated will you please just get out of my face?	Not naming, not repeating, not following ?	Attends on both sides CN: ?	Pupils equal?	+BTT bilaterally ?	Could not comply with EOM but had intact OCR ?	Facial movements symmetric on grimace Motor/Movement: ?	Decreased tone?	Feet in plantar flexed position at rest?	No movement in any extremities to command or noxious Coordination:  ?	NTReflexes: ?	3+ biceps, triceps, BR?	trace patella?	2+ achilles?	R achilles reflex elicited sustained clonus, L did not ?	Toes mute?	positive Hoffman's bilaterally ?	No pectorals Sensory: ?	Grimace to noxious in all 4 ?	Seems to have diffuse allodynia of b/l lowers; crying out in pain when touching lower extremities DIAGNOSTICS: Head and Spine Imaging: MRI Brain 01/05/22 IMPRESSION: No evidence of recent infarction, hemorrhage, or mass. Chronic microvascular ischemic changes. No substantial change from recent prior study. ??MRI total spine 01/04/22 1. Dextroscoliotic curvature of the midthoracic spine. 2. Mild left-sided facet arthropathy at T10-11 and T11-12. No foraminal or canal stenosis at any level within the thoracic spine. 3. Prominence of the posterior epidural fat throughout the thoracic spine, although does not contribute to significant canal stenosis. MRI Brain 12/11/21 IMPRESSION: 1. No acute intracranial pathology. 2. Mild-to-moderate global parenchymal volume loss and chronic white matter microangiopathy. CTH 12/5/22IMPRESSION No acute intracranial process. Other Diagnostics: EEG 01/06/22 This is abnormal EEG, the Presence of Generalized slowing is consistent with Encephalopathy - Dr Joice Lofts?ASSESSMENT/PLAN: 56F previously healthy with recent prolonged hospital course in [place] from 10/2021 to 01/2022 for diverticulitis c/b bowel perforation s/p s/p hemicolectomy/ileostomy with c/c/b persistent altered mental status and functional decline beginning POD5 with negative MRI imaging of total neuraxis and negative EEG with work-up only notable for folate deficiency for which she was started on folate and thiamine. OSH course also c/b surgical incision abscess s/p drainage, TPN placement iso difficult family dynamics and complex GOC with ethics involvement discontinued following staph epi bacteremia and UTI s/p PEG placement, and DVT started on Eliquis. 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 Neurology consulted for prolonged AMS. Exam with concern for encephalopathy with possible volitional component with recommendations for evaluation for catatonia with psychiatry. Given extensive prior imaging work-up and EEG negative, recommend obtaining an LP given prolonged AMS and notable possibility of carcinomatosis on [place] AP to rule out a paraneoplastic process, but would be atypical if her clinical presentation acutely worsened following a surgical procedure without any symptoms prior to her initial procedure. Recommendations:- Obtain Lumbar Puncture: counts, protein, glucose and autoimmune encephalopathy panel, cytology and flow cytometry - f/u Vit B1 and MMA - f/u psychiatry evaluation for catatonia Signed:Milik Gilreath Sande Brothers, MD MANeurology, PGY-3Contact: MHB02/27/23 10:33 AM

## 2022-02-18 ENCOUNTER — Inpatient Hospital Stay: Admit: 2022-02-18 | Payer: PRIVATE HEALTH INSURANCE

## 2022-02-18 ENCOUNTER — Inpatient Hospital Stay: Admit: 2022-02-18 | Payer: PRIVATE HEALTH INSURANCE | Attending: Diagnostic Radiology

## 2022-02-18 DIAGNOSIS — R627 Adult failure to thrive: Secondary | ICD-10-CM

## 2022-02-18 LAB — COMPREHENSIVE METABOLIC PANEL
BKR A/G RATIO: 0.4 — ABNORMAL LOW (ref 1.0–2.2)
BKR ALANINE AMINOTRANSFERASE (ALT): 77 U/L — ABNORMAL HIGH (ref 10–35)
BKR ALBUMIN: 2.2 g/dL — ABNORMAL LOW (ref 3.6–4.9)
BKR ALKALINE PHOSPHATASE: 141 U/L — ABNORMAL HIGH (ref 9–122)
BKR ANION GAP: 7 (ref 7–17)
BKR ASPARTATE AMINOTRANSFERASE (AST): 57 U/L — ABNORMAL HIGH (ref 10–35)
BKR AST/ALT RATIO: 0.7
BKR BILIRUBIN TOTAL: 0.2 mg/dL (ref <=1.2)
BKR BLOOD UREA NITROGEN: 15 mg/dL (ref 8–23)
BKR BUN / CREAT RATIO: 68.2 — ABNORMAL HIGH (ref 8.0–23.0)
BKR CALCIUM: 9.8 mg/dL (ref 8.8–10.2)
BKR CHLORIDE: 114 mmol/L — ABNORMAL HIGH (ref 98–107)
BKR CO2: 23 mmol/L (ref 20–30)
BKR CREATININE: 0.22 mg/dL — ABNORMAL LOW (ref 0.40–1.30)
BKR EGFR, CREATININE (CKD-EPI 2021): >60 mL/min/{1.73_m2} (ref >=60)
BKR GLOBULIN: 5 g/dL — ABNORMAL HIGH (ref 2.3–3.5)
BKR GLUCOSE: 94 mg/dL (ref 70–100)
BKR POTASSIUM: 3.7 mmol/L (ref 3.3–5.3)
BKR PROTEIN TOTAL: 7.2 g/dL (ref 6.6–8.7)
BKR SODIUM: 144 mmol/L (ref 136–144)

## 2022-02-18 LAB — ZZZCSF WITH DIFF TUBE #1     (YH)
BKR GRANULOCYTES, CSF #1: 30 % — ABNORMAL HIGH
BKR LYMPHOCYTES, CSF #1: 50 % — ABNORMAL HIGH
BKR MONOCYTES, CSF #1: 20 % — ABNORMAL HIGH

## 2022-02-18 LAB — PROTEIN, CSF     (BH GH L LMW YH): BKR PROTEIN,TOTAL CSF: 32.3 mg/dL (ref 15.0–45.0)

## 2022-02-18 LAB — GLUCOSE, CSF: BKR GLUCOSE CSF: 67 mg/dL (ref 40–70)

## 2022-02-18 LAB — CSF CELL COUNT 1 (BH GH LMW YH)

## 2022-02-18 LAB — CELL COUNT CSF, TUBE 1     (YH)
BKR NUCLEATED CELLS/UL, CSF #1: 2 {cells}/uL (ref <6)
BKR RED CELLS/UL, CSF #1: 200 {cells}/uL — ABNORMAL HIGH

## 2022-02-18 LAB — CSF CELL COUNT 4 (BH GH LMW YH)

## 2022-02-18 LAB — ZZZCELL COUNT CSF, TUBE 4     (YH)
BKR NUCLEATED CELLS/UL, CSF #4: 1 {cells}/uL (ref <6)
BKR RED CELLS/UL, CSF #4: 44 {cells}/uL — ABNORMAL HIGH

## 2022-02-18 LAB — IMMUNOGLOBULIN G SUBCLASSES     (BH GH LMW Q YH)
BKR IGG: 3235 mg/dL — ABNORMAL HIGH (ref 700–1600)
BKR IMMUNOGLOBULIN G SUBCLASS 1: 1494 mg/dL — ABNORMAL HIGH (ref 382–929)
BKR IMMUNOGLOBULIN G SUBCLASS 2: 1215 mg/dL — ABNORMAL HIGH (ref 242–700)
BKR IMMUNOGLOBULIN G SUBCLASS 3: 209 mg/dL — ABNORMAL HIGH (ref 22–176)
BKR IMMUNOGLOBULIN G SUBCLASS 4: 92.3 mg/dL — ABNORMAL HIGH (ref 3.9–86.4)

## 2022-02-18 LAB — PT/INR AND PTT (BH GH L LMW YH)
BKR INR: 1.1 (ref 0.92–1.19)
BKR PARTIAL THROMBOPLASTIN TIME: 26.8 s (ref 23.0–31.4)
BKR PROTHROMBIN TIME: 11.4 s (ref 9.6–12.3)

## 2022-02-18 LAB — TOTAL PROTEIN AND ALBUMIN,SERUM  (YH)
BKR ALBUMIN, SERUM: 2.4 g/dL — ABNORMAL LOW (ref 3.6–4.9)
BKR TOTAL PROTEIN, SERUM: 6.7 g/dL (ref 6.0–8.3)

## 2022-02-18 LAB — FREE KAPPA LAMBDA WITH RATIO, SERUM
BKR KAPPA FLC, SERUM: 14.9 mg/dL — ABNORMAL HIGH (ref 0.33–1.94)
BKR KAPPA/LAMBDA FLC RATIO, SERUM: 2.37 — ABNORMAL HIGH (ref 0.26–1.65)
BKR LAMBDA FLC, SERUM: 6.29 mg/dL — ABNORMAL HIGH (ref 0.57–2.63)

## 2022-02-18 LAB — ZZZCSF WITH DIFF TUBE #4     (YH): BKR LYMPHOCYTES, CSF #4: 100 % — ABNORMAL HIGH

## 2022-02-18 LAB — MAGNESIUM: BKR MAGNESIUM: 1.9 mg/dL (ref 1.7–2.4)

## 2022-02-18 MED ORDER — ENOXAPARIN 80 MG/0.8 ML SUBCUTANEOUS SYRINGE
80 mg/0.8 mL | Freq: Two times a day (BID) | SUBCUTANEOUS | Status: DC
Start: 2022-02-18 — End: 2022-04-11
  Administered 2022-02-19 – 2022-04-11 (×94): 80 mg/0.8 mL via SUBCUTANEOUS

## 2022-02-18 NOTE — Other
-  CONSULT  REQUEST  DOC[facility]ATION-[facility] NOTE-Type of consult: [facility] Podiatry -New Consult: [name] [name] /Location: 9831/9831-A / Brief Clinical Question: Pt with bilateral feet ulcerations, wound RN recommending podiatry eval/Callback Cell Phone: [phone] / Please confirm receipt of this message by texting back ?OK?-1 - Mobile Heartbeat message sent to Berton Lan at 9:03 AM. Received response at 0907.-Giuseppina Quinones Dick2/28/20239:03 [facility] [phone]

## 2022-02-18 NOTE — Procedures
Danielle [name] is a 66 y.o. female patient.  SNOMED [place](R) 1. Fever, unspecified fever cause  FEVER 2. Dehydration  DEHYDRATION 3. Adult failure to thrive  ADULT FAILURE TO THRIVE SYNDROME 4. SIRS (systemic inflammatory response syndrome) (HC Code) (HC CODE) (HC Code)  SYSTEMIC INFLAMMATORY RESPONSE SYNDROME 5. Metabolic encephalopathy  METABOLIC ENCEPHALOPATHY 6. Failure to thrive in adult  ADULT FAILURE TO THRIVE SYNDROME 7. Hemoperitoneum  HEMORRHAGE INTO PERITONEAL CAVITY 8. Feeding by G-tube (HC Code)  GASTROSTOMY PRESENT 9. History of exploratory laparotomy  HISTORY OF SURGERY 10. History of right hemicolectomy  HISTORY OF PARTIAL RESECTION OF COLON 11. History of ileostomy  H/O: ILEOSTOMY 12. 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 Elevated LFTs  LIVER FUNCTION TEST INCREASED 14. Bowel perforation (HC Code)  PERFORATION OF INTESTINE 15. Altered mental status, unspecified altered mental status type  ALTERED MENTAL STATUS No past medical history on file.Blood pressure 130/74, pulse (!) 91, temperature 98.8 ?F (37.1 ?C), temperature source Axillary, resp. rate 18, height 5' 10 (1.778 m), weight 79.4 kg, SpO2 99 %.Lumbar PunctureDate/Time: 02/18/2022 3:30 PMPerformed by: Truman Hayward, MDAuthorized by: Truman Hayward, MD Timing: the time out is initiated prior to the beginning of the procedure:  YesName of patient and medical record or DOB stated and matches ID band or previously confirmed medical record number.:  YesThe procedural consent is used to verify the procedure to be performed and it matches the patient identifiers:  YesSite of procedure(s) (with laterality or level) is topically marked per policy and visible after draping:  YesAllergies addressed: Yes. Medications addressed:  YesPre-procedure details:   Procedure purpose:  Diagnostic  Preparation: Patient was prepped and draped in usual sterile fashion  Anesthesia:   Anesthesia method:  Local infiltration  Local anesthetic:  Lidocaine 1% w/o epiProcedure details:   Lumbar space:  L4-L5 interspace  Patient position:  R lateral decubitus  Needle gauge:  18  Needle type:  Spinal needle - Quincke tip  Needle length (in):  3.5  Ultrasound guidance: no    Number of attempts:  3 (first 2 attempts done by J. Kelly-Hauser, APRN)  Opening pressure (cm H2O):  8  Fluid appearance:  Clear  Tubes of fluid:  4  Total volume (ml):  14.5Post-procedure:   Puncture site:  Adhesive bandage applied  Patient tolerance of procedure:  Tolerated well, no immediate complicationsAbdelkader Luverne Farone, MD2/28/2023

## 2022-02-18 NOTE — Plan of Care
Plan of Care Overview/ Patient Status    0700-1900Pt is A&Ox1, disoriented to time, place, and situation. VSS on R.A. BP runs soft. Peg tube feed and flush per [facility] as well as medications. New boots placed, wound care nurse came in, specialty bed ordered. [place] of liver today as well as L.P. Pt was brought down for barium swallow test and refused. Patient is soft spoken and says a few words. Incontinent of bladder, stool present in the ileostomy. Doesn't get OOB, assist x2, T&R q2hrs. Safety maintained with bed in lowest position. Problem: Adult Inpatient Plan of CareGoal: Plan of Care ReviewOutcome: Interventions implemented as appropriateGoal: Optimal Comfort and WellbeingOutcome: Interventions implemented as appropriate Problem: Fall Injury RiskGoal: Absence of Fall and Fall-Related InjuryOutcome: Interventions implemented as appropriate

## 2022-02-18 NOTE — Progress Notes
[facility] Digestive Diseases Gastroenterology Progress NoteAttending Provider: Truman Hayward, MD [phone] History: - Was more interactive today. States that she feels confused about what is going on. States that she doesn't like to be in the hospital- Knew who she was, but wasn't sure where she was- No fevers, chills. About to get LP per neuro recsObjective: Vitals:Temp:  [97.9 ?F (36.6 ?C)-98.8 ?F (37.1 ?C)] 98.8 ?F (37.1 ?C)Pulse:  [91-101] 91Resp:  [16-20] 18BP: (102-130)/(67-74) 130/74SpO2:  [97 %-99 %] 99 %Device (Oxygen Therapy): room airPhysical Exam:GENERAL: awake, but difficult to understand. Doesnot respond to a lot of commandsENT: Moist mucous membranes, poor teethCARDIOVASCULAR: Regular rate and rhythm, no murmursRESPIRATORY: No respiratory distress on RAGASTROINTESTINAL: G tube in place, with some leakage. Nontender to palpationSKIN: Warm and dry. No edemaNEURO: Moves all four extremities?Medications:SCHEDULED:Current Facility-Administered Medications Medication Dose Route Frequency Provider Last Rate Last Admin ? [Held by provider] apixaban (ELIQUIS) tablet 5 mg  5 mg Per G Tube Q12H Littie Deeds, MD     ? chlorhexidine gluconate (PERIDEX) 0.12 % solution 15 mL  15 mL Mouth/Throat BID Dorisann Frames, MD   15 mL at 02/18/22 4782 ? Elemental iron 60 mg (300 mg Ferrous sulfate) / 5 mL oral liquid 45 mg  45 mg Per G Tube BID Dorisann Frames, MD   45 mg at 02/18/22 9562 ? enoxaparin (LOVENOX) injection 80 mg  80 mg Subcutaneous Q12H Chaar, Abdelkader, MD     ? famotidine (PEPCID) suspension (8 mg/mL) 20 mg  20 mg Per G Tube Daily Dorisann Frames, MD   20 mg at 02/18/22 1308 ? folic acid (FOLVITE) tablet 1 mg  1 mg Per G Tube Daily Dorisann Frames, MD   1 mg at 02/18/22 6578 ? niacin Immediate Release tablet 100 mg  100 mg Per G Tube Nightly Dorisann Frames, MD   100 mg at 02/17/22 2245 ? pyridoxine (vitamin B6) (B-6) tablet 100 mg  100 mg Per G Tube Daily Dorisann Frames, MD   100 mg at 02/18/22 4696 ? [Held by provider] rosuvastatin (CRESTOR) tablet 10 mg  10 mg Per G Tube Nightly Dorisann Frames, MD   10 mg at 02/14/22 2245 ? sodium chloride 0.9 % flush 3 mL  3 mL IV Push Q8H Dorisann Frames, MD   3 mL at 02/17/22 2246 ? thiamine (VITAMIN B1) tablet 100 mg  100 mg Per G Tube BID Dorisann Frames, MD   100 mg at 02/18/22 2952 WUX:[id number], dextrose (GLUCOSE) oral liquid 15 g **OR** fruit juice **OR** skim milk, dextrose (GLUCOSE) oral liquid 30 g **OR** fruit juice, dextrose injection, dextrose injection, glucagon, sodium chlorideLabs:Recent Labs Lab 02/25/230531 02/26/230455 02/27/230457 WBC 6.1 6.3 6.2 HGB 9.5* 9.2* 8.7* HCT 32.60* 30.50* 29.30* PLT 312 289 259  Recent Labs Lab 02/25/230531 NEUTROPHILS 57.0  Recent Labs Lab 02/26/230455 02/26/230853 02/27/230457 02/27/230837 02/28/230556 02/28/230851 02/28/231315 NA 144  --  145*  --  144  --   --  K 3.6  --  3.4  --  3.7  --   --  CL 113*  --  114*  --  114*  --   --  CO2 23  --  24  --  23  --   --  BUN 15  --  17  --  15  --   --  CREATININE 0.30*  --  0.24*  --  0.22*  --   --  GLU 99   < > 94   < > 94   < > 108* ANIONGAP 8  --  7  --  7  --   --   < > = values in this interval not displayed.  Recent Labs Lab 02/26/230455 02/27/230457 02/28/230556 CALCIUM 9.7 9.7 9.8 MG  --   --  1.9  Recent Labs Lab 02/25/230531 02/26/230455 02/27/230457 02/28/230556 ALT 147* 136* 109* 77* AST 135* 141* 79* 57* ALKPHOS 124* 138* 148* 141* BILITOT 0.3 0.3 0.2 0.2 BILIDIR <0.2  --   --   --  PROT 7.1 7.2 7.0 7.2 ALBUMIN 2.1* 1.8* 2.3* 2.2* - 2.4*  Recent Labs Lab 02/24/230509 02/25/230531 02/26/230455 02/28/230556 PTT 57.1* 49.8*  --  26.8 LABPROT  --  11.9 12.0 11.4 INR  --  1.15 1.16 1.10  No results found for: IRON, TIBC, FERRITIN Imaging:[place] Abdomen Pelvis w contrast (02/12/2022):Lung bases: Dependent atelectasis and patchy groundglass changes are seen.Hepatobiliary: Unremarkable.Pancreas: Unremarkable.Spleen: Unremarkable.Adrenal glands: Unremarkable.Kidneys: Simple appearing left renal cyst.Bowel: Percutaneous gastrostomy in situ. 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 Oral contrast extends into right lower quadrant enterostomy. Extensive diverticulosis in the large bowel. No definite findings of acute diverticulitis.Abdominal and pelvic lymph nodes: No lymphadenopathy.Peritoneum: Multiple peritoneal nodules are seen, most conspicuous adjacent to the gallbladder and along right lateral conal fascia.Vasculature: No abdominal aortic aneurysm. Pelvis: Calcified uterine fibroids. Hypodense thickening of endometrium, measuring 1.4 cmMusculoskeletal system and soft tissue: No aggressive osseous lesion.IMPRESSION:No collection in abdomen or pelvis. 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 Correlation with prior imaging is recommended.?Procedural:ReviewedOImpression & Recommendations Symphoni Hout is a 66 y.o. female with history of rheumatoid arthritis, previous prolonged hostapilization for perforated diverticulitis and large volume pneumonperitoneun s/p ex lap and R hemicolectomy/ileostomy in November who presents to [facility] for LTAC placement. Hepatology was consulted for abnormal LFTs.?Noted one day of LFT abnormality with AST/ALT 189/200s. 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  Overall, likely ceftriaxone (for which patient received one dose) vs zosyn. Prior measurements of LFTs at OSH seen in Care Everywhere largely have been normal, so do not suspect chornic liver disease. However, on initial review of laboratory studies, noted that IgG and ASMA elevated. Fortunately, LFT have continued to downtrend so do not feel pressed for steroid therapy at this time.  Normally, we would recommend lvier biopsy, but would defer to primary team who is coordinating the entirety of the patient's clinical care (re: encephalopathy, possible carcinomatosis).- Would normally recommend liver biopsy to clarify diagnostics pending clinical course- Continue to trend LFTs, INR dailyWe will continue to follow.Case discussed with Dr. Cheral Marker, final attending attestation to follow. Please do not hesitate to contact our team with any questions or concernsS. [name], MDYale Digestive DiseasesBest Contact: Mobile Heartbeat [phone] after 5pm and weekends, please contact on-call GI fellow as listed on AmionI saw and evaluated Ms. Macdougal with Dr. [name] on Liver Consult Rounds.I agree with the evaluation/assessment/plan as outlined.We spoke directly to Dr. Loletta Parish to assess utility of liver biopsy to further evaluation AIH in context of her overall care plan.  Given neurologic w/u is priority and there is consideration of IR bx to evaluate ?carcinomatosis and that LFTs are improving will defer for now and continue to reassess as outside records are obtained/reviewed and overall course/care plan progresses.

## 2022-02-18 NOTE — Progress Notes
[facility] Medicine Progress NotePatient: Danielle [name], a 66 y.o. femaleAttending Provider: Truman Hayward, MDAdmission Date: 2/21/2023Hospital Day: 7Subjective: Interim History: Patient was seen and examined this morning, no acute issues overnightReview of Hx/Allergies/Meds: I have reviewed the patient's: current scheduled medications and current prn medicationsPMH: No past medical history on file. PSH: No past surgical history on file. Allergies: has No Known Allergies. Current Medications:Scheduled: Current Facility-Administered Medications Medication Dose Route Frequency Provider Last Rate Last Admin ? [Held by provider] apixaban (ELIQUIS) tablet 5 mg  5 mg Per G Tube Q12H Littie Deeds, MD     ? chlorhexidine gluconate (PERIDEX) 0.12 % solution 15 mL  15 mL Mouth/Throat BID Dorisann Frames, MD   15 mL at 02/18/22 0981 ? Elemental iron 60 mg (300 mg Ferrous sulfate) / 5 mL oral liquid 45 mg  45 mg Per G Tube BID Dorisann Frames, MD   45 mg at 02/18/22 1914 ? enoxaparin (LOVENOX) injection 80 mg  80 mg Subcutaneous Q12H Kevante Lunt, MD     ? famotidine (PEPCID) suspension (8 mg/mL) 20 mg  20 mg Per G Tube Daily Dorisann Frames, MD   20 mg at 02/18/22 7829 ? folic acid (FOLVITE) tablet 1 mg  1 mg Per G Tube Daily Dorisann Frames, MD   1 mg at 02/18/22 5621 ? niacin Immediate Release tablet 100 mg  100 mg Per G Tube Nightly Dorisann Frames, MD   100 mg at 02/17/22 2245 ? pyridoxine (vitamin B6) (B-6) tablet 100 mg  100 mg Per G Tube Daily Dorisann Frames, MD   100 mg at 02/18/22 3086 ? [Held by provider] rosuvastatin (CRESTOR) tablet 10 mg  10 mg Per G Tube Nightly Dorisann Frames, MD   10 mg at 02/14/22 2245 ? sodium chloride 0.9 % flush 3 mL  3 mL IV Push Q8H Dorisann Frames, MD   3 mL at 02/17/22 2246 ? thiamine (VITAMIN B1) tablet 100 mg  100 mg Per G Tube BID Dorisann Frames, MD   100 mg at 02/18/22 5784  Continuous Infusions:  PRN: acetaminophen, dextrose (GLUCOSE) oral liquid 15 g **OR** fruit juice **OR** skim milk, dextrose (GLUCOSE) oral liquid 30 g **OR** fruit juice, dextrose injection, dextrose injection, glucagon, sodium chloride Objective: I have reviewed the patient's current vital signs as documented in the patient's EMR.Vitals (Last 24 Hrs):Temp:  [98.1 ?F (36.7 ?C)-98.8 ?F (37.1 ?C)] 98.8 ?F (37.1 ?C)Pulse:  [91-101] 91Resp:  [16-18] 18BP: (102-130)/(67-74) 130/74SpO2:  [97 %-99 %] 99 %Device (Oxygen Therapy): room air on RAWt Readings from Last 3 Encounters: 02/14/22 79.4 kg 12/13/18 98.6 kg  I/O's (Last 24 Hrs):Gross Totals (Last 24 hours) at 02/18/2022 1721Last data filed at 02/18/2022 1330Intake 1207 ml Output 850 ml Net 357 ml  Procedures:None Physical Exam: General Impression: Alert and oriented x2, not in acute distressHEENT: Normocephalic atraumatic, extra-ocular movements intactCardiovascular: Heart regular rate and rhythm, S1&S2 audibleChest: Lungs clear to auscultation bilaterally, no rhonchi, no wheezeAbdomen: Bowel sounds present, abdomen soft, non-tenderMusculoskeletal: Mild LE edemaNeurological: Answers simple questions, follows commands, moves both upper extremities spontaneously and LE to painful stimuli Labs: CBCRecent Labs Lab 02/25/230531 02/26/230455 02/27/230457 WBC 6.1 6.3 6.2 HGB 9.5* 9.2* 8.7* HCT 32.60* 30.50* 29.30* MCV 90.1 87.6 88.0 PLT 312 289 259 BMPRecent Labs Lab 02/24/230509 02/25/230531 02/26/230455 02/27/230457 02/28/230556 NA 149*   < > 144 145* 144 K 3.2*   < > 3.6 3.4  3.7 CL 114*   < > 113* 114* 114* CO2 26   < > 23 24 23  ANIONGAP 9   < > 8 7 7  BUN 23   < > 15 17 15  CREATININE 0.36*   < > 0.30* 0.24* 0.22* CALCIUM 10.3*   < > 9.7 9.7 9.8 MG 2.0  --   --   --  1.9  < > = values in this interval not displayed.  LFTsRecent Labs Lab 02/24/230509 02/25/230531 02/26/230455 02/27/230457 02/28/230556 ALT 189* 147* 136* 109* 77* AST 200* 135* 141* 79* 57* ALKPHOS 103 124* 138* 148* 141* BILITOT 0.4 0.3 0.3 0.2 0.2 BILIDIR 0.2 <0.2  --   --   --  ALBUMIN 2.2* 2.1* 1.8* 2.3* 2.2* - 2.4* CoagsRecent Labs Lab 02/28/230556 INR 1.10 PTT 26.8  GlucoseRecent Labs Lab 02/27/231303 02/27/231825 02/27/232214 02/28/230556 02/28/230851 02/28/231315 GLU 105* 134* 105* 94 93 108*  A1CNo results found for: HGBA1C Diagnostics: Imaging:No new radiology.Echo:No results found for this or any previous visit.ECG/Tele Events: No ECG ordered todayAssessment: 66 y.o. 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 ID consulted to help with source of infection, improving on IV abx.  Course c/b G-tube leaking, hypernatremia, transaminitis. Plan: # Acute encephalopathy- Work up so far unrevealing- s/p LP today, will follow up pending tests. 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 Will review outside records and discuss with IR if can be done same time with the peritoneal nodules biopsy vs separately - Cont to trend daily LFTs# Elevated FLC ratio- Pending final UPEP and IFE- Check LDH and beta-2 microglobulin- Had multiple imaging studies at OSH last month. No mention for lytic lesions on the reports. 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 The CD will be shipped to [place] this week though [employer]# Bilateral LE DVT- Diagnosed during recent hospitalization in [place]. Seen on [place] from 01/22/2022. Possibly provoked in the setting of recent surgery but also now with concerns for malignancy- Lovenox resumed today following LP# Fevers, leukocytosis- Now resolved, no clear source identified. Wonder if relate to DVT vs malignancy? - Treated with 5 days of zosyn per IDDVT ppx: LovenoxDiet: Tube feedsMedical decision making for this patient was high.I spent 50 minutes today on this encounter before, during and after the visit, reviewing labs and records, evaluating and examining the patient, entering orders and documenting the visit. Electronically Signed:Mickelle Goupil, MDHospitalist Attending PhysicianYale Oceans [facility] [address]New [place], [place] [address]/28/20236:03 PMDISCLAIMER: This chart was created using M-Modal dictation software. Efforts were made by me to ensure accuracy, however some errors may be present due to limitations of this technology and occasionally words are not transcribed as intended.

## 2022-02-18 NOTE — Plan of Care
Plan of Care Overview/ Patient Status    Pt A+Ox1, RA, VSS. Pt rested well overnight. Tolerated tube feeding and flushes overnight with no N/V/distension. Pt had meds overnight, holding AM lovenox in anticipation of LP today. Mepilex to sacrum, barrier cream applied, z-guard to MASD around PEG site and new dressing applied. Boots readjusted frequently to relieve pressure points. Q.2 turn. IV intact. Good output from ileostomy. Largely incontinent of urine overnight (purewick did not take), but working this AM. Pt winces in pain with activity but sleeps between care. Pt slightly more interactive overnight smiling and conversing more than the night prior. No complaints or questions.  Problem: Adult Inpatient Plan of CareGoal: Plan of Care ReviewOutcome: Interventions implemented as appropriateGoal: Patient-Specific Goal (Individualized)Outcome: Interventions implemented as appropriateGoal: Absence of Hospital-Acquired Illness or InjuryOutcome: Interventions implemented as appropriateGoal: Optimal Comfort and WellbeingOutcome: Interventions implemented as appropriateGoal: Readiness for Transition of CareOutcome: Interventions implemented as appropriate Problem: InfectionGoal: Absence of Infection Signs and SymptomsOutcome: Interventions implemented as appropriate Problem: Fall Injury RiskGoal: Absence of Fall and Fall-Related InjuryOutcome: Interventions implemented as appropriate Problem: Impaired Wound HealingGoal: Optimal Wound HealingOutcome: Interventions implemented as appropriate

## 2022-02-19 LAB — CBC WITH AUTO DIFFERENTIAL
BKR WAM ABSOLUTE IMMATURE GRANULOCYTES.: 0.02 x 1000/ÂµL (ref 0.00–0.30)
BKR WAM ABSOLUTE LYMPHOCYTE COUNT.: 2.51 x 1000/ÂµL (ref 0.60–3.70)
BKR WAM ABSOLUTE NRBC (2 DEC): 0 x 1000/ÂµL (ref 0.00–1.00)
BKR WAM ANALYZER ANC: 3.35 x 1000/ÂµL (ref 2.00–7.60)
BKR WAM BASOPHIL ABSOLUTE COUNT.: 0.03 x 1000/ÂµL (ref 0.00–1.00)
BKR WAM BASOPHILS: 0.4 % (ref 0.0–1.4)
BKR WAM EOSINOPHIL ABSOLUTE COUNT.: 0.27 x 1000/ÂµL (ref 0.00–1.00)
BKR WAM EOSINOPHILS: 4 % (ref 0.0–5.0)
BKR WAM HEMATOCRIT (2 DEC): 30.4 % — ABNORMAL LOW (ref 35.00–45.00)
BKR WAM HEMOGLOBIN: 8.9 g/dL — ABNORMAL LOW (ref 11.7–15.5)
BKR WAM IMMATURE GRANULOCYTES: 0.3 % (ref 0.0–1.0)
BKR WAM LYMPHOCYTES: 37 % (ref 17.0–50.0)
BKR WAM MCH (PG): 26 pg — ABNORMAL LOW (ref 27.0–33.0)
BKR WAM MCHC: 29.3 g/dL — ABNORMAL LOW (ref 31.0–36.0)
BKR WAM MCV: 88.9 fL (ref 80.0–100.0)
BKR WAM MONOCYTE ABSOLUTE COUNT.: 0.6 x 1000/ÂµL (ref 0.00–1.00)
BKR WAM MONOCYTES: 8.8 % (ref 4.0–12.0)
BKR WAM MPV: 10.4 fL (ref 8.0–12.0)
BKR WAM NEUTROPHILS: 49.5 % (ref 39.0–72.0)
BKR WAM NUCLEATED RED BLOOD CELLS: 0 % (ref 0.0–1.0)
BKR WAM PLATELETS: 274 x1000/ÂµL (ref 150–420)
BKR WAM RDW-CV: 20 % — ABNORMAL HIGH (ref 11.0–15.0)
BKR WAM RED BLOOD CELL COUNT.: 3.42 M/ÂµL — ABNORMAL LOW (ref 4.00–6.00)
BKR WAM WHITE BLOOD CELL COUNT: 6.8 x1000/ÂµL (ref 4.0–11.0)

## 2022-02-19 LAB — COMPREHENSIVE METABOLIC PANEL
BKR A/G RATIO: 0.5 — ABNORMAL LOW (ref 1.0–2.2)
BKR ALANINE AMINOTRANSFERASE (ALT): 58 U/L — ABNORMAL HIGH (ref 10–35)
BKR ALBUMIN: 2.4 g/dL — ABNORMAL LOW (ref 3.6–4.9)
BKR ALKALINE PHOSPHATASE: 140 U/L — ABNORMAL HIGH (ref 9–122)
BKR ANION GAP: 7 (ref 7–17)
BKR ASPARTATE AMINOTRANSFERASE (AST): 37 U/L — ABNORMAL HIGH (ref 10–35)
BKR AST/ALT RATIO: 0.6
BKR BILIRUBIN TOTAL: 0.2 mg/dL (ref <=1.2)
BKR BLOOD UREA NITROGEN: 19 mg/dL (ref 8–23)
BKR BUN / CREAT RATIO: 67.9 — ABNORMAL HIGH (ref 8.0–23.0)
BKR CALCIUM: 9.8 mg/dL (ref 8.8–10.2)
BKR CHLORIDE: 114 mmol/L — ABNORMAL HIGH (ref 98–107)
BKR CO2: 25 mmol/L (ref 20–30)
BKR CREATININE: 0.28 mg/dL — ABNORMAL LOW (ref 0.40–1.30)
BKR EGFR, CREATININE (CKD-EPI 2021): >60 mL/min/{1.73_m2} (ref >=60)
BKR GLOBULIN: 5 g/dL — ABNORMAL HIGH (ref 2.3–3.5)
BKR GLUCOSE: 113 mg/dL — ABNORMAL HIGH (ref 70–100)
BKR POTASSIUM: 4 mmol/L (ref 3.3–5.3)
BKR PROTEIN TOTAL: 7.4 g/dL (ref 6.6–8.7)
BKR SODIUM: 146 mmol/L — ABNORMAL HIGH (ref 136–144)

## 2022-02-19 LAB — FNA PANEL     (YH)
BKR CD19+: 3.6 %
BKR CD19+CD10+: 0 %
BKR CD19+CD20+: 3.6 %
BKR CD19+CD5+: 0 %
BKR CD19+KAPPA+: 100 %
BKR CD19+LAMBDA+: 100 %
BKR CD20+: 10.7 %
BKR CD3+: 83.9 %
BKR CD3+CD4+: 54.8 %
BKR CD3+CD4+CD8+: 0 %
BKR CD3+CD8+: 32.3 %
BKR CD3-CD16/56+: 3.2 %
BKR CD4/CD8 RATIO: 1.7

## 2022-02-19 LAB — MAGNESIUM: BKR MAGNESIUM: 2 mg/dL (ref 1.7–2.4)

## 2022-02-19 LAB — FLOW CYTOMETRY - LEUKEMIA/LYMPHOMA/NEOPLASIA, TISSUE/FLUID

## 2022-02-19 LAB — VIABILITY (LAB ONLY) (YH): BKR VIABILITY: 44.6 %

## 2022-02-19 LAB — PROTEIN ELECTROPHORESIS, SERUM    (YH)
BKR ALBUMIN ELP: 2.41 g/dL — ABNORMAL LOW (ref 3.50–4.70)
BKR ALPHA-1-GLOBULIN: 0.24 g/dL (ref 0.10–0.30)
BKR ALPHA-2-GLOBULIN: 0.6 g/dL (ref 0.60–1.00)
BKR BETA GLOBULIN: 0.7 g/dL (ref 0.70–1.20)
BKR GAMMA GLOBULIN: 2.75 g/dL — ABNORMAL HIGH (ref 0.70–1.50)

## 2022-02-19 LAB — IMMUNOFIXATION ELECTROPHORESIS, SERUM

## 2022-02-19 LAB — SEDIMENTATION RATE (ESR): BKR SEDIMENTATION RATE, ERYTHROCYTE: 98 mm/h — ABNORMAL HIGH (ref 0–20)

## 2022-02-19 LAB — PHOSPHORUS     (BH GH L LMW YH): BKR PHOSPHORUS: 2.2 mg/dL (ref 2.2–4.5)

## 2022-02-19 LAB — ANA BY IFA W/RFLX TO DSDNA, RNP, SM, SSA, AND SSB ABS     (LMW YH): BKR ANTI-NUCLEAR ANTIBODY (ANA): POSITIVE — AB

## 2022-02-19 LAB — ANA TITER AND PATTERN     (YH): BKR ANA TITER: 1:160 {titer} — AB

## 2022-02-19 LAB — BETA-2 MICROGLOBULIN, SERUM: BKR BETA-2 MICROGLOBULIN: 4.07 mg/L — ABNORMAL HIGH (ref 0.80–2.20)

## 2022-02-19 LAB — C-REACTIVE PROTEIN     (CRP): BKR C-REACTIVE PROTEIN, HIGH SENSITIVITY: 10.7 mg/L — ABNORMAL HIGH

## 2022-02-19 LAB — LACTATE DEHYDROGENASE: BKR LACTATE DEHYDROGENASE: 232 U/L (ref 122–241)

## 2022-02-19 NOTE — Plan of Care
Plan of Care Overview/ Patient Status    Problem: Adult Inpatient Plan of CareGoal: Readiness for Transition of CareOutcome: Interventions implemented as appropriate Pt discussed during TC rounds, not medically ready for discharge at this time. Pt in need of LTC facility, will need conservator/POA and T-19 for placement. SW following pt. Elinor Parkinson, RN, MSNCare ManagerMHB: [phone]

## 2022-02-19 NOTE — Consults
[facility]	Consult NoteDate: 2/28/2023Patient Name: Danielle [name]: [id number] Attending Provider: Truman Hayward, MDCC: Bilateral foot wounds Subjective: HPI: Danielle [name] is a 66 y.o. female with PMHx RA on MTX, recent prolonged hospitalization in [place] for perforated diverticulitis s/p ex lap/R hemicolectomy/end ileostomy, persistent encephalopathy with multiple stable MRIs, surgical incision abscess s/p drainage, G-tube placement, DVT on AC a/w persistent encephalopathy, fevers and leukocytosis. Podiatry consulted for bilateral foot wounds. Patient in AMS, unable to respond to questions at this time. Patient in offloading boots. Denies n/v/f/c/sob/cp.PMH: PSH: No past medical history on file. No past surgical history on file. Social History: Family History: Social History Tobacco Use  Smoking status: Not on file  Smokeless tobacco: Not on file Substance Use Topics  Alcohol use: Not on file  No family history on file. Allergies: Patient has no known allergies.Review of Systems: Unable to assess 2/2 AMSVitals: Vitals:  02/18/22 0844 BP: 130/74 Pulse: (!) 91 Resp: 18 Temp: 98.8 ?F (37.1 ?C) Physical Exam: Gen: NADHead: NormocephalicPsych: AAO x 2Chest: CTAB, no r/r/wCardio: RRR, S1, S2, no m/r/gAbd: Soft NT, ND, +bsVascular: DP/PT pulses palpable. Normal CRT to all digits. Foot is warm to touch. Pedal hair growth is absentNeuro: Unable to assess MSK: Unable to ROM digits when asked, unable to participate in MMTDerm: deep tissue injury noted to the Right lateral foot without any open lesions. Superficial deep tissue injury at the right medial eminence. No SOI. Labs: Lab Results Component Value Date  WBC 6.2 02/17/2022  HGB 8.7 (L) 02/17/2022  HCT 29.30 (L) 02/17/2022  CREATININE 0.22 (L) 02/18/2022  BUN 15 02/18/2022  CO2 23 02/18/2022 PLT 259 02/17/2022  INR 1.10 02/18/2022  GLU 93 02/18/2022  K 3.7 02/18/2022  NA 144 02/18/2022  CL 114 (H) 02/18/2022  ALT 77 (H) 02/18/2022  AST 57 (H) 02/18/2022  TSH 1.670 02/17/2022 Imaging: None Micro/Path: NoneAssessment/Plan: Danielle [name] is a 66 y.o. female with Right lateral foot and Left medial eminence deep tissue injuries, stable. No open wounds or SOI. - Dressing applied: allevyn pad to the Right lateral foot - No dressing necessary to the Left foot - Nursing dressing changes as above - Recommend WBAT to BLE- Antibiotics per primary team - none indicated per podiatry standpoint- No indication for podiatric surgical intervention at this time- Podiatry will continue to followWill discuss with attending, Dr. Darral Dash page on-call podiatry resident with questions:SEE AMION FOR CALL SCHEDULE Berton Lan, DPMPodiatric Surgery PGY-2Electronically Signed by Audrie Gallus, DPM, February 18, 2022 Discussed with resident, agree with plan.Electronically Signed by Donn Pierini, DPM, February 18, 2022

## 2022-02-19 NOTE — Plan of Care
Plan of Care Overview/ Patient Status    0700-1900Pt is A&Ox1, disoriented to time, place, and situation. VSS on R.A. BP runs soft. Peg tube feed and flush per [facility] as well as medications.   Patient is soft spoken and says a few words. Incontinent of bladder, stool present in the ileostomy. L.P. was performed today and labs were drawn accordingly. Doesn't get OOB, assist x2, T&R q2hrs. Safety maintained with bed in lowest position. Problem: Adult Inpatient Plan of CareGoal: Plan of Care ReviewOutcome: Interventions implemented as appropriateGoal: Optimal Comfort and WellbeingOutcome: Interventions implemented as appropriate

## 2022-02-19 NOTE — Progress Notes
[facility] Medicine Progress NotePatient: Danielle [name], a 66 y.o. femaleAttending Provider: Truman Hayward, MDAdmission Date: 2/21/2023Hospital Day: 8Subjective: Interim History: Patient was seen and examined this morning, no acute issues overnight. Tolerated LP well yesterday, initial results unremarkable. Review of Hx/Allergies/Meds: I have reviewed the patient's: current scheduled medications and current prn medicationsPMH: No past medical history on file. PSH: No past surgical history on file. Allergies: has No Known Allergies. Current Medications:Scheduled: Current Facility-Administered Medications Medication Dose Route Frequency Provider Last Rate Last Admin ? [Held by provider] apixaban (ELIQUIS) tablet 5 mg  5 mg Per G Tube Q12H Littie Deeds, MD     ? chlorhexidine gluconate (PERIDEX) 0.12 % solution 15 mL  15 mL Mouth/Throat BID Dorisann Frames, MD   15 mL at 02/19/22 0954 ? Elemental iron 60 mg (300 mg Ferrous sulfate) / 5 mL oral liquid 45 mg  45 mg Per G Tube BID Dorisann Frames, MD   45 mg at 02/19/22 0955 ? enoxaparin (LOVENOX) injection 80 mg  80 mg Subcutaneous Q12H Jalaya Sarver, MD   80 mg at 02/19/22 0954 ? famotidine (PEPCID) suspension (8 mg/mL) 20 mg  20 mg Per G Tube Daily Dorisann Frames, MD   20 mg at 02/19/22 2956 ? folic acid (FOLVITE) tablet 1 mg  1 mg Per G Tube Daily Dorisann Frames, MD   1 mg at 02/19/22 2130 ? niacin Immediate Release tablet 100 mg  100 mg Per G Tube Nightly Dorisann Frames, MD   100 mg at 02/18/22 2204 ? pyridoxine (vitamin B6) (B-6) tablet 100 mg  100 mg Per G Tube Daily Dorisann Frames, MD   100 mg at 02/19/22 0955 ? [Held by provider] rosuvastatin (CRESTOR) tablet 10 mg  10 mg Per G Tube Nightly Dorisann Frames, MD   10 mg at 02/14/22 2245 ? sodium chloride 0.9 % flush 3 mL  3 mL IV Push Q8H Dorisann Frames, MD   3 mL at 02/18/22 2204 ? thiamine (VITAMIN B1) tablet 100 mg  100 mg Per G Tube BID Dorisann Frames, MD   100 mg at 02/19/22 0955  Continuous Infusions:  PRN: acetaminophen, dextrose (GLUCOSE) oral liquid 15 g **OR** fruit juice **OR** skim milk, dextrose (GLUCOSE) oral liquid 30 g **OR** fruit juice, dextrose injection, dextrose injection, glucagon, sodium chloride Objective: I have reviewed the patient's current vital signs as documented in the patient's EMR.Vitals (Last 24 Hrs):Temp:  [98 ?F (36.7 ?C)-98.1 ?F (36.7 ?C)] 98.1 ?F (36.7 ?C)Pulse:  [84-95] 89Resp:  [16-18] 16BP: (100-107)/(64-74) 100/67SpO2:  [97 %-98 %] 98 %Device (Oxygen Therapy): room air on RAWt Readings from Last 3 Encounters: 02/14/22 79.4 kg 12/13/18 98.6 kg  I/O's (Last 24 Hrs):Gross Totals (Last 24 hours) at 02/19/2022 1558Last data filed at 02/19/2022 1000Intake 957 ml Output 1300 ml Net -343 ml  Procedures:None Physical Exam: General Impression: Alert and oriented x1, not in acute distressHEENT: Normocephalic atraumatic, extra-ocular movements intactCardiovascular: Heart regular rate and rhythm, S1&S2 audibleChest: Lungs clear to auscultation bilaterally, no rhonchi, no wheezeAbdomen: Bowel sounds present, abdomen soft, non-tenderMusculoskeletal: Mild LE edemaNeurological: Answers simple questions, follows commands, moves both upper extremities spontaneously and LE to painful stimuli Labs: CBCRecent Labs Lab 02/26/230455 02/27/230457 03/01/230359 WBC 6.3 6.2 6.8 HGB 9.2* 8.7* 8.9* HCT 30.50* 29.30* 30.40* MCV 87.6 88.0 88.9 PLT 289 259 274 BMPRecent Labs Lab 02/24/230509 02/25/230531 02/27/230457 02/28/230556 03/01/230359 NA 149*   < >  145* 144 146* K 3.2*   < > 3.4 3.7 4.0 CL 114*   < > 114* 114* 114* CO2 26   < > 24 23 25  ANIONGAP 9   < > 7 7 7  BUN 23   < > 17 15 19  CREATININE 0.36*   < > 0.24* 0.22* 0.28* CALCIUM 10.3*   < > 9.7 9.8 9.8 MG 2.0  --   --  1.9 2.0 PHOS  --   --   --   --  2.2  < > = values in this interval not displayed.  LFTsRecent Labs Lab 02/24/230509 02/25/230531 02/26/230455 02/27/230457 02/28/230556 03/01/230359 ALT 189* 147*   < > 109* 77* 58* AST 200* 135*   < > 79* 57* 37* ALKPHOS 103 124*   < > 148* 141* 140* BILITOT 0.4 0.3   < > 0.2 0.2 0.2 BILIDIR 0.2 <0.2  --   --   --   --  ALBUMIN 2.2* 2.1*   < > 2.3* 2.2* - 2.4* 2.4*  < > = values in this interval not displayed. CoagsRecent Labs Lab 02/28/230556 INR 1.10 PTT 26.8  GlucoseRecent Labs Lab 02/28/230851 02/28/231315 02/28/232109 03/01/230359 03/01/230839 03/01/231217 GLU 93 108* 151* 113* 111* 127*  A1CNo results found for: HGBA1C Diagnostics: Imaging:No new radiology.Echo:No results found for this or any previous visit.ECG/Tele Events: No ECG ordered todayAssessment: 66 y.o. 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 ID consulted to help with source of infection, improving on IV abx.  Course c/b G-tube leaking, hypernatremia, transaminitis. Plan: # Acute encephalopathy- Work up so far unrevealing- s/p LP today, initial results unremarkable, will follow up pending tests. 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 Will review outside records and discuss with IR if can be done same time with the peritoneal nodules biopsy vs separately - Cont to trend daily LFTs# Elevated FLC ratio- Pending final UPEP and IFE- Check LDH and beta-2 microglobulin- Had multiple imaging studies at OSH last month. 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 - Imaging CDs arrived today, I delivered to [employer] to be uploaded to Epic and asked for inhouse radiology read# Bilateral LE DVT- Diagnosed during recent hospitalization in [place]. Seen on [place] from 01/22/2022. Possibly provoked in the setting of recent surgery but also now with concerns for malignancy- Lovenox resumed following LP# Fevers, leukocytosis- Now resolved, no clear source identified. Wonder if relate to DVT vs malignancy? - Treated with 5 days of zosyn per IDDVT ppx: LovenoxDiet: Tube feedsMedical decision making for this patient was high.I spent 50 minutes today on this encounter before, during and after the visit, reviewing labs and records, evaluating and examining the patient, entering orders and documenting the visit. Electronically Social research officer, government, MDHospitalist Attending PhysicianYale Ambulatory [facility]  [address] [place], [place] [address]/1/20234:00 PMDISCLAIMER: This chart was created using M-Modal dictation software. Efforts were made by me to ensure accuracy, however some errors may be present due to limitations of this technology and occasionally words are not transcribed as intended.

## 2022-02-19 NOTE — Plan of Care
Plan of Care Overview/ Patient Status    Pt A+Ox1-2 (can say hospital at times). VSS. Tolerated TF and water flushes without issue. Turn q.2. Was given a break from boots for a few hours overnight to prevent pressure spots. Wound to left lower leg redressed with xeroform, gauze and kerlix (no mepilex on the unit). Good output from ileostomy. Pt pulls purewick out frequently but incontinent of large amounts of urine. LP site on back CDI. Expressed signs of pain with activity but is quick to return to sleep in between care. No complaints/questions. Problem: Adult Inpatient Plan of CareGoal: Plan of Care ReviewOutcome: Interventions implemented as appropriateGoal: Patient-Specific Goal (Individualized)Outcome: Interventions implemented as appropriateGoal: Absence of Hospital-Acquired Illness or InjuryOutcome: Interventions implemented as appropriateGoal: Optimal Comfort and WellbeingOutcome: Interventions implemented as appropriateGoal: Readiness for Transition of CareOutcome: Interventions implemented as appropriate Problem: InfectionGoal: Absence of Infection Signs and SymptomsOutcome: Interventions implemented as appropriate Problem: Fall Injury RiskGoal: Absence of Fall and Fall-Related InjuryOutcome: Interventions implemented as appropriate Problem: Impaired Wound HealingGoal: Optimal Wound HealingOutcome: Interventions implemented as appropriate Problem: Violence Risk or ActualGoal: Anger and Impulse ControlOutcome: Interventions implemented as appropriate

## 2022-02-19 NOTE — Other
BRIEF NEUROLOGY NOTE:INTERVAL HISTORY:- LP performed, results belowEXAM: Not performed todayDIAGNOSTICS:CSF 02/18/2022 Latest Reference Range & Units 02/18/22 15:36 CSF  Tube 1 Appearance, CSF 1 Clear  Clear Color, CSF 1 No Xanthochromia  No Xanthochromia Red Cells/uL, CSF #1 None Cells/uL 200 (H) Nucleated Cells/uL, CSF #1 <6 Cells/uL 2 Granulocytes, CSF #1 None % 30 (H) Lymphocytes, CSF #1 None % 50 (H) Monocytes, CSF #1 None % 20 (H) CSF  Tube 4 Appearance, CSF 4 Clear  Clear Color, CSF 4 No Xanthochromia  No Xanthochromia Red Cells/uL, CSF #4 None Cells/uL 44 (H) Nucleated Cells/uL, CSF #4 <6 Cells/uL 1 Lymphocytes, CSF #4 None % 100 (H) Glucose, CSF 40 - 70 mg/dL 67 Protein,Total CSF 16.1 - 45.0 mg/dL 09.6 (H): Data is abnormally highCSF Cx: NGTD (prelim)Cytology pendingFlow cytometry pendingAutoimmune Encephalopathy panel CSF and Serum pending A/P:59F previously healthy with recent prolonged hospital course in [place] from 10/2021 to 01/2022 for diverticulitis c/b bowel perforation s/p s/p hemicolectomy/ileostomy with c/c/b persistent altered mental status and functional decline beginning POD5 with negative MRI imaging of total neuraxis and negative EEG with work-up only notable for folate deficiency for which she was started on folate and thiamine. OSH course also c/b surgical incision abscess s/p drainage, TPN placement iso difficult family dynamics and complex GOC with ethics involvement discontinued following staph epi bacteremia and UTI s/p PEG placement, and DVT started on Eliquis. Moved to [place] bu family for [facility] and presenting now for c/f infectious with imaging notable for peritoneal nodules with possible c/f carcinomatosis. Neurology consulted for prolonged AMS. Psychiatry felt that this was not catatonia. Overall, lumbar puncture unrevealing. She has already received an extensive neurologic work-up without further evaluation needed. Please follow-up with remaining CSF studies malignancy and autoimmune studies and reach out to neurology if they are abnormal or any further concerns.Recommendations: - f/u ongoing cytology, flow cytometry, and autoimmune panel (will likely take days-weeks)- Please reach out to neurology team if these results are abnormal- Continue thiamine/folate supplementation- No further inpatient neurologic work-up- can follow-up with outpatient neurology with decision on whether to pursue further neuropsychiatric testing or neurocognitive clinic follow-upDiscussed with Dr. Celedonio Savage. Attending signature +/- addendum to follow. Signed:Dariane Natzke Sande Brothers, MD MANeurology, PGY-3Contact: MHB03/01/23 11:06 AM

## 2022-02-19 NOTE — Other
Ostomy Type: end ileostomy, 11/27/2022Stoma size: 30mm Protruding/Flush: protrudingStoma color: pink, moistPeristomal skin: dry, intactOutput: flatus, stoolPouch type: 1-piece pouchAble to empty independently: NoPatient observed pouch change: YesAble to pouch independently: NoOstomy Type: mucous fistula, 11/27/2022Stoma size: 22mm x 56mmProtruding/Flush: flushStoma color: pink, moistPeristomal skin: dry, intactOutput: thick tan mucousPouch type: 1-piece pouchOstomy visit with patient. Patient has a end ileostomy and a mucous fistula that was placed in [facility] of last year. Both pouches were peeling off at time of this visit and changed by this RN. 1-piece pouch placed on each.Patient was alert with garbled confused speech. Patient is dependent with all ostomy care. Ileostomy (RLQ) recommendations:-empty pouch when 1/3-1/2 full-change pouch every 3 days and as needed for leakage -do not reinforce wafer if leakage-use 1-piece pouch (Infor# [id number])Patient is dependent for all ostomy care.Mucous fistula (LUQ) recommendations:-empty pouch when 1/3-1/2 full-change pouch every 3 days and as needed for leakage -do not reinforce wafer if leakage-use 1-piece pouch (Infor# [id number])Patient is dependent for all ostomy care.-Karly Pitter Steva Colder RN/Wound Clinician Team-MHB [phone]/12/2021 12:33 PM

## 2022-02-19 NOTE — Plan of Care
Inpatient Physical Therapy Progress Note IP Adult PT Eval/Treat - 02/19/22 1009    Date of Visit / Treatment  Date of Visit / Treatment 02/19/22   Note Type Progress Note   End Time 1009    General Information  Subjective pt agreeable to therapy services with therapist and RN encouragement   General Observations pt in bed, session cleared with RN prior   Precautions/Limitations Fall Precautions;Bed alarm    Vital Signs and Orthostatic Vital Signs  Vital Signs Vital Signs Stable    Pain/Comfort  Pain Comment (Pre/Post Treatment Pain) pt reports pain in feet with B LE movement, RN aware, positioned to comfort post, pain reduced with increased movement indicating pain associated with stiffness    Manual Muscle Testing  Manual Muscle Testing Comments +deconditioned    Sensory Assessment  Sensory Tests Results No sensory impairment noted    Skin Assessment  Skin Assessment See Nursing Documentation    Bed Mobility  Bed Mobility Comments pt deferred bed mobility in favor of PROM x4    Handoff Documentation  Handoff Patient in bed;Bed alarm;Patient instructed to call nursing for mobility;Discussed with nursing    Endurance  Endurance Comments poor    PT- AM-PAC - Basic Mobility Screen- How much help from another person do you currently need.....  Turning from your back to your side while in a a flat bed without using rails? 1 - Total - Requires total assistance or cannot do it at all.   Moving from lying on your back to sitting on the side of a flat bed without using bed rails? 1 - Total - Requires total assistance or cannot do it at all.   Moving to and from a bed to a chair (including a wheelchair)? 1 - Total - Requires total assistance or cannot do it at all.   Standing up from a chair using your arms(e.g., wheelchair or bedside chair)? 1 - Total - Requires total assistance or cannot do it at all.   To walk in a hospital room? 1 - Total - Requires total assistance or cannot do it at all.   Climbing 3-5 steps with a railing? 1 - Total - Requires total assistance or cannot do it at all.   AMPAC Mobility Score 6   TARGET Highest Level of Mobility Mobility Level 2, Turn self in bed/bed activity/dependent transfer     Therapeutic Exercise  Therapeutic Exercise Comments PROM B UE and LE    Clinical Impression  Follow up Assessment Pt tolerated treatment fairly with fair effort throughout.  Pt effort limited by pain at fatigue.  Therapy focused on deep breathing to decrease pain associated with joint stiffness with initial movements during PROM.  Pt showed intermittent success with performing with VC.  Recommend DC to STR when medically cleared to DC with continued PT in the hospital setting to maximize functional mobility.    Frequency/Equipment Recommendations  PT Frequency 3x per week   What day of week is next treatment expected? Friday   3/3   PT Recommendations for Inpatient Admission  Activity/Level of Assist assist of 2;mechanical lift   ADL Recommendations assist of 2   bed level   Planned Treatment / Interventions  Plan for Next Visit progress as tolerated    PT Discharge Summary  Physical Therapy Disposition Recommendation Short Term Rehab

## 2022-02-20 ENCOUNTER — Inpatient Hospital Stay: Admit: 2022-02-20 | Payer: PRIVATE HEALTH INSURANCE

## 2022-02-20 DIAGNOSIS — R627 Adult failure to thrive: Secondary | ICD-10-CM

## 2022-02-20 LAB — COMPREHENSIVE METABOLIC PANEL
BKR A/G RATIO: 0.5 — ABNORMAL LOW (ref 1.0–2.2)
BKR ALANINE AMINOTRANSFERASE (ALT): 43 U/L — ABNORMAL HIGH (ref 10–35)
BKR ALBUMIN: 2.6 g/dL — ABNORMAL LOW (ref 3.6–4.9)
BKR ALKALINE PHOSPHATASE: 132 U/L — ABNORMAL HIGH (ref 9–122)
BKR ANION GAP: 8 (ref 7–17)
BKR ASPARTATE AMINOTRANSFERASE (AST): 32 U/L (ref 10–35)
BKR AST/ALT RATIO: 0.7
BKR BILIRUBIN TOTAL: 0.3 mg/dL (ref <=1.2)
BKR BLOOD UREA NITROGEN: 12 mg/dL (ref 8–23)
BKR BUN / CREAT RATIO: 42.9 — ABNORMAL HIGH (ref 8.0–23.0)
BKR CALCIUM: 10.8 mg/dL — ABNORMAL HIGH (ref 8.8–10.2)
BKR CHLORIDE: 113 mmol/L — ABNORMAL HIGH (ref 98–107)
BKR CO2: 24 mmol/L (ref 20–30)
BKR CREATININE: 0.28 mg/dL — ABNORMAL LOW (ref 0.40–1.30)
BKR EGFR, CREATININE (CKD-EPI 2021): >60 mL/min/{1.73_m2} (ref >=60)
BKR GLOBULIN: 5.5 g/dL — ABNORMAL HIGH (ref 2.3–3.5)
BKR GLUCOSE: 119 mg/dL — ABNORMAL HIGH (ref 70–100)
BKR POTASSIUM: 3.9 mmol/L (ref 3.3–5.3)
BKR PROTEIN TOTAL: 8.1 g/dL (ref 6.6–8.7)
BKR SODIUM: 145 mmol/L — ABNORMAL HIGH (ref 136–144)

## 2022-02-20 LAB — CBC WITH AUTO DIFFERENTIAL
BKR WAM ABSOLUTE IMMATURE GRANULOCYTES.: 0.02 x 1000/ÂµL (ref 0.00–0.30)
BKR WAM ABSOLUTE LYMPHOCYTE COUNT.: 1.57 x 1000/ÂµL (ref 0.60–3.70)
BKR WAM ABSOLUTE NRBC (2 DEC): 0 x 1000/ÂµL (ref 0.00–1.00)
BKR WAM ANALYZER ANC: 3.84 x 1000/ÂµL (ref 2.00–7.60)
BKR WAM BASOPHIL ABSOLUTE COUNT.: 0.03 x 1000/ÂµL (ref 0.00–1.00)
BKR WAM BASOPHILS: 0.5 % (ref 0.0–1.4)
BKR WAM EOSINOPHIL ABSOLUTE COUNT.: 0.19 x 1000/ÂµL (ref 0.00–1.00)
BKR WAM EOSINOPHILS: 3.2 % (ref 0.0–5.0)
BKR WAM HEMATOCRIT (2 DEC): 35.2 % (ref 35.00–45.00)
BKR WAM HEMOGLOBIN: 10.3 g/dL — ABNORMAL LOW (ref 11.7–15.5)
BKR WAM IMMATURE GRANULOCYTES: 0.3 % (ref 0.0–1.0)
BKR WAM LYMPHOCYTES: 26.2 % (ref 17.0–50.0)
BKR WAM MCH (PG): 26.2 pg — ABNORMAL LOW (ref 27.0–33.0)
BKR WAM MCHC: 29.3 g/dL — ABNORMAL LOW (ref 31.0–36.0)
BKR WAM MCV: 89.6 fL (ref 80.0–100.0)
BKR WAM MONOCYTE ABSOLUTE COUNT.: 0.34 x 1000/ÂµL (ref 0.00–1.00)
BKR WAM MONOCYTES: 5.7 % (ref 4.0–12.0)
BKR WAM MPV: 10.7 fL (ref 8.0–12.0)
BKR WAM NEUTROPHILS: 64.1 % (ref 39.0–72.0)
BKR WAM NUCLEATED RED BLOOD CELLS: 0 % (ref 0.0–1.0)
BKR WAM PLATELETS: 307 x1000/ÂµL (ref 150–420)
BKR WAM RDW-CV: 20.1 % — ABNORMAL HIGH (ref 11.0–15.0)
BKR WAM RED BLOOD CELL COUNT.: 3.93 M/ÂµL — ABNORMAL LOW (ref 4.00–6.00)
BKR WAM WHITE BLOOD CELL COUNT: 6 x1000/ÂµL (ref 4.0–11.0)

## 2022-02-20 LAB — C3 COMPLEMENT: BKR C3 COMPLEMENT: 118 mg/dL (ref 90–180)

## 2022-02-20 LAB — C4 COMPLEMENT     (BH GH L LMW YH): BKR C4 COMPLEMENT: 18 mg/dL (ref 10–40)

## 2022-02-20 LAB — AMMONIA     (BH GH L LMW YH): BKR AMMONIA: 13 umol/L (ref 11–35)

## 2022-02-20 LAB — RHEUMATOID FACTOR: BKR RHEUMATOID FACTOR: 22.9 [IU]/mL — ABNORMAL HIGH (ref <14.0)

## 2022-02-20 LAB — ZZZSMOOTH MUSCLE AB, IGG TITER (LAB ORDERABLE ONLY)    (YH): SMOOTH MUSCLE AB, IGG TITER: <1:20 {titer}

## 2022-02-20 LAB — FOLATE: BKR FOLATE: >20 ng/mL

## 2022-02-20 MED ORDER — SODIUM CHLORIDE 0.9 % BOLUS (NEW BAG)
0.9 % | Freq: Once | INTRAVENOUS | Status: DC
Start: 2022-02-20 — End: 2022-03-04

## 2022-02-20 NOTE — Other
-  CONSULT  REQUEST  [facility] NOTE-Type of consult: [facility] -New Consult: [name] [name] /Location: 9831/9831-A / Brief Clinical Question: Pt admitted with AMS and weakness of unclear etiology. 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 Please eval if autoimmune component here/Callback Cell Phone: [phone] / Please confirm receipt of this message by texting back ?OK?-1 - Mobile Heartbeat message sent to Diamantina Monks at 10:09 AM. Received response at 1013.-Misha Antonini Dick3/2/202310:09 [facility] [phone]

## 2022-02-20 NOTE — Consults
[facility]le Interventional RadiologyConsultation Information Interventional [name] consultation requested by: Danielle [name], MDReason for consultation: peritoneal nodule biopsy, liver biopsySource of information: Patient, medical record, and consulting providerHistory of Present Illness Danielle [name] is a 66 y.o. 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 She presented to [name] for inability to care for self and family unable to care for her. She was found to be febrile and tachycardic. [facility] imaging revealed peritoneal nodules, for which Interventional [name] was requested to evaluate for biopsy, concerning for malignancy/carcinomatosis. Additionally, she had elevated LFTs c/f DILI, but also with elevated IgG and ASMA c/f AIH, for which a liver biopsy was requested.Past Medical History No past medical history on file. Past Surgical History No past surgical history on file. Family History No family history on file. Social History Social History Tobacco Use ? Smoking status: Not on file ? Smokeless tobacco: Not on file Substance Use Topics ? Alcohol use: Not on file  She has no history on file for drug use.  Inpatient Medications Outpatient Medications Current Facility-Administered Medications Medication Dose Route Frequency Provider Last Rate Last Admin ? [Held by provider] apixaban (ELIQUIS) tablet 5 mg  5 mg Per G Tube Q12H Littie Deeds, MD     ? chlorhexidine gluconate (PERIDEX) 0.12 % solution 15 mL  15 mL Mouth/Throat BID Dorisann Frames, MD   15 mL at 02/20/22 0981 ? Elemental iron 60 mg (300 mg Ferrous sulfate) / 5 mL oral liquid 45 mg  45 mg Per G Tube BID Dorisann Frames, MD   45 mg at 02/20/22 1914 ? enoxaparin (LOVENOX) injection 80 mg  80 mg Subcutaneous Q12H Chaar, Abdelkader, MD   80 mg at 02/20/22 7829 ? famotidine (PEPCID) suspension (8 mg/mL) 20 mg  20 mg Per G Tube Daily Dorisann Frames, MD   20 mg at 02/20/22 5621 ? folic acid (FOLVITE) tablet 1 mg  1 mg Per G Tube Daily Dorisann Frames, MD   1 mg at 02/20/22 3086 ? niacin Immediate Release tablet 100 mg  100 mg Per G Tube Nightly Dorisann Frames, MD   100 mg at 02/19/22 2141 ? pyridoxine (vitamin B6) (B-6) tablet 100 mg  100 mg Per G Tube Daily Dorisann Frames, MD   100 mg at 02/20/22 5784 ? [Held by provider] rosuvastatin (CRESTOR) tablet 10 mg  10 mg Per G Tube Nightly Dorisann Frames, MD   10 mg at 02/14/22 2245 ? sodium chloride 0.9 % flush 3 mL  3 mL IV Push Q8H Dorisann Frames, MD   3 mL at 02/18/22 2204 ? thiamine (VITAMIN B1) tablet 100 mg  100 mg Per G Tube BID Dorisann Frames, MD   100 mg at 02/20/22 6962 acetaminophen, dextrose (GLUCOSE) oral liquid 15 g **OR** fruit juice **OR** skim milk, dextrose (GLUCOSE) oral liquid 30 g **OR** fruit juice, dextrose injection, dextrose injection, glucagon, sodium chloride Medications Prior to Admission Medication Sig Dispense Refill Last Dose ? acetaminophen (TYLENOL) 160 mg/5 mL elixir 500 mg by Per G Tube route every 6 (six) hours as needed.    ? acetaminophen (TYLENOL) 500 mg tablet Give 2 tablets via PEG-tube every 6 hours as needed    ?  apixaban (ELIQUIS) 5 mg tablet 1 tablet (5 mg total) by Per G Tube route every 12 (twelve) hours.    ? chlorhexidine gluconate (PERIDEX) 0.12 % solution Use as directed 15 mLs in the mouth or throat 2 (two) times daily.    ? Elemental iron 60 mg (300 mg Ferrous sulfate) / 5 mL oral liquid Give 3.7 mL by g-tube two times daily    ? folic acid (FOLVITE) 1 mg tablet 1 tablet (1 mg total) by Per G Tube route daily.    ? insulin lispro (ADMELOG, HUMALOG) 100 unit/mL vial Inject under the skin 3 (three) times daily before meals. Use per sliding scale.    ? niacin 50 mg Immediate Release tablet 2 tablets (100 mg total) by Per G Tube route at bedtime.    ? pantoprazole (PROTONIX) 40 mg GrPS oral suspension 1 packet (40 mg total) by Per G Tube route daily.    ? psyllium (METAMUCIL) packet Give 1 packet via g-tube two times daily    ? pyridoxine, vitamin B6, (VITAMIN B-6) 100 mg tablet 1 tablet (100 mg total) by Per G Tube route daily.    ? rosuvastatin (CRESTOR) 10 mg tablet 1 tablet (10 mg total) by Per G Tube route nightly.    ? therapeutic multivitamin liquid 15 mLs by Per G Tube route daily.    ? thiamine (VITAMIN B1) 100 mg tablet 1 tablet (100 mg total) by Per G Tube route 2 (two) times daily.     Allergies No Known Allergies Objective Data Physical ExamVitals:  02/19/22 0806 02/19/22 1517 02/20/22 0040 02/20/22 0802 BP: 107/74 100/67 115/78 111/74 Pulse: 84 89 (!) 98 (!) 94 Resp: 16 16 18 16  Temp: 98 ?F (36.7 ?C) 98.1 ?F (36.7 ?C) 97.9 ?F (36.6 ?C) 97.3 ?F (36.3 ?C) TempSrc: Axillary Oral Oral Axillary SpO2: 98% 98% 94% 99% Weight:     Height:     Laboratory ResultsChemistry:Recent Labs Lab 02/28/230556 03/01/230359 03/02/230924 NA 144 146* 145* K 3.7 4.0 3.9 CL 114* 114* 113* CO2 23 25 24  BUN 15 19 12  CREATININE 0.22* 0.28* 0.28* Complete Blood Count:Recent Labs Lab 02/27/230457 03/01/230359 03/02/230924 WBC 6.2 6.8 6.0 HGB 8.7* 8.9* 10.3* HCT 29.30* 30.40* 35.20 PLT 259 274 307 Liver Function Tests:Recent Labs Lab 02/28/230556 03/01/230359 03/02/230924 AST 57* 37* 32 ALT 77* 58* 43* ALKPHOS 141* 140* 132* BILITOT 0.2 0.2 0.3 Coagulation Studies:Recent Labs Lab 02/24/230509 02/25/230531 02/26/230455 02/28/230556 PTT 57.1* 49.8*  --  26.8 LABPROT  --  11.9 12.0 11.4 INR  --  1.15 1.16 1.10 Microbiology:No results for input(s): LABBLOO, LABURIN, LOWERRESPIRA in the last 168 hours.Pertinent ImagingPrior imaging reviewed from OSH via [facility] AP 2/22/23IMPRESSION:No collection in abdomen or pelvis. 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 Correlation with prior imaging is recommended.Assessment and Plan AssessmentCrystal [name] is a 66 y.o. 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 [facility] imaging revealed peritoneal nodules, for which Interventional [name] was requested to evaluate for biopsy, concerning for malignancy/carcinomatosis. Additionally, she had elevated LFTs, IgG, ASMA, for which a liver biopsy was requested.Case and imaging reviewed with Dr. Soyla Murphy and Dr. Doran Heater. After comprehensive evaluation of the patient and discussion with the requesting service, Interventional [name] recommends:-image guided ([place]) abdominal/peritoneal nodule biopsy-image guided liver parenchymal biopsyConsult is for:  MASS BIOPSYIndication: c/f malignancy/carcinomatosisReference image:Laterality: Operator Choice Prior biopsy of same site: NoType: Core  Imaging modality: [place] Clinical trial: NoReason for inpatient biopsy: not medically ready for discharge; unclear dispo planCoagulation Studies:Recent Labs Lab 02/24/230509 02/25/230531 02/26/230455 02/28/230556 PTT 57.1* 49.8*  --  26.8 LABPROT  --  11.9 12.0 11.4 INR  --  1.15 1.16 1.10 Consult is for:  LIVER BIOPSYIndication: elevated LFTs, IgG, ASMA c/f AIHPressures requested: NoBiopsy approach: Operator's Discretion; likely percutaneousCoagulation Studies:Recent Labs Lab 02/24/230509 02/25/230531 02/26/230455 02/28/230556 PTT 57.1* 49.8*  --  26.8 LABPROT  --  11.9 12.0 11.4 INR  --  1.15 1.16 1.10 PLAN: -Biopsies will be scheduled together, same day-COVID test within 3 days-NPO after midnight-Hold lovenox 12hrs prior to procedure-Check BMP, CBC morning of procedureCOVID-19: Please have a valid COVID-19 test within 72 hours of the procedure.Sedation: Moderate sedation using midazolam and fentanyl.Diet: NPO past midnight the day of the procedure.Consent: The procedure was discussed with the surrogate decision maker, Jacquenette Shone (sister) and informed written consent obtained.Interpretor Services: NoneCoagulation:   Procedure risk: Intermediate.   Blood thinners: Therapeutic dalteparin (Fragmin) and/or enoxaparin (Lovenox) - Hold for 12 hours prior to the procedure.   Labs: Please correct INR to <1.8 and platelets to >50,000.Contrast:   Allergy: Iodinated contrast will not be used for the planned procedure.   Renal function: Intravenous iodinated contrast will not be used during the planned procedure.Code Status: Full code.*Please note that procedure times as indicated in Epic are placeholders and do not necessarily reflect when the procedure will be performed. Thank you for involving Interventional [name] in the care of your patient. Please text or call my Mobile Heart Beat with any questions or concerns.With urgent questions or concerns, please contact IR ZO:[id number] (pagers)- YSC: [phone]- SRC: [phone]- BH: [phone] (phone, all locations)- [phone] [place], APRN 02/20/2022

## 2022-02-20 NOTE — Progress Notes
[facility] Medicine Progress NotePatient: Danielle [name], a 66 y.o. femaleAttending Provider: Truman Hayward, MDAdmission Date: 2/21/2023Hospital Day: 9Subjective: Interim History: Patient was seen and examined this morning, no acute issues overnight. Still no changes to neuro exam. Review of Hx/Allergies/Meds: I have reviewed the patient's: current scheduled medications and current prn medicationsPMH: No past medical history on file. PSH: No past surgical history on file. Allergies: has No Known Allergies. Current Medications:Scheduled: Current Facility-Administered Medications Medication Dose Route Frequency Provider Last Rate Last Admin ? [Held by provider] apixaban (ELIQUIS) tablet 5 mg  5 mg Per G Tube Q12H Littie Deeds, MD     ? chlorhexidine gluconate (PERIDEX) 0.12 % solution 15 mL  15 mL Mouth/Throat BID Dorisann Frames, MD   15 mL at 02/20/22 1610 ? Elemental iron 60 mg (300 mg Ferrous sulfate) / 5 mL oral liquid 45 mg  45 mg Per G Tube BID Dorisann Frames, MD   45 mg at 02/20/22 9604 ? enoxaparin (LOVENOX) injection 80 mg  80 mg Subcutaneous Q12H Anthoni Geerts, MD   80 mg at 02/20/22 5409 ? famotidine (PEPCID) suspension (8 mg/mL) 20 mg  20 mg Per G Tube Daily Dorisann Frames, MD   20 mg at 02/20/22 8119 ? folic acid (FOLVITE) tablet 1 mg  1 mg Per G Tube Daily Dorisann Frames, MD   1 mg at 02/20/22 1478 ? niacin Immediate Release tablet 100 mg  100 mg Per G Tube Nightly Dorisann Frames, MD   100 mg at 02/19/22 2141 ? pyridoxine (vitamin B6) (B-6) tablet 100 mg  100 mg Per G Tube Daily Dorisann Frames, MD   100 mg at 02/20/22 2956 ? [Held by provider] rosuvastatin (CRESTOR) tablet 10 mg  10 mg Per G Tube Nightly Dorisann Frames, MD   10 mg at 02/14/22 2245 ? sodium chloride 0.9 % flush 3 mL  3 mL IV Push Q8H Dorisann Frames, MD 3 mL at 02/18/22 2204 ? thiamine (VITAMIN B1) tablet 100 mg  100 mg Per G Tube BID Dorisann Frames, MD   100 mg at 02/20/22 2130  Continuous Infusions:  PRN: acetaminophen, dextrose (GLUCOSE) oral liquid 15 g **OR** fruit juice **OR** skim milk, dextrose (GLUCOSE) oral liquid 30 g **OR** fruit juice, dextrose injection, dextrose injection, glucagon, sodium chloride Objective: I have reviewed the patient's current vital signs as documented in the patient's EMR.Vitals (Last 24 Hrs):Temp:  [97.3 ?F (36.3 ?C)-98.1 ?F (36.7 ?C)] 97.3 ?F (36.3 ?C)Pulse:  [89-98] 94Resp:  [16-18] 16BP: (100-115)/(67-78) 111/74SpO2:  [94 %-99 %] 99 %Device (Oxygen Therapy): room air on RAWt Readings from Last 3 Encounters: 02/14/22 79.4 kg 12/13/18 98.6 kg  I/O's (Last 24 Hrs):Gross Totals (Last 24 hours) at 02/20/2022 1137Last data filed at 02/19/2022 2000Intake 1461 ml Output 650 ml Net 811 ml  Procedures:None Physical Exam: General Impression: Alert and oriented x1, not in acute distressHEENT: Normocephalic atraumatic, extra-ocular movements intactCardiovascular: Heart regular rate and rhythm, S1&S2 audibleChest: Lungs clear to auscultation bilaterally, no rhonchi, no wheezeAbdomen: Bowel sounds present, abdomen soft, non-tenderMusculoskeletal: Mild LE edemaNeurological: Answers simple questions, follows commands, moves both upper extremities spontaneously but not lower extremities except that she feels the pain when ellicitedLabs: CBCRecent Labs Lab 02/27/230457 03/01/230359 03/02/230924 WBC 6.2 6.8 6.0 HGB 8.7* 8.9* 10.3* HCT 29.30* 30.40* 35.20 MCV 88.0 88.9 89.6 PLT 259 274 307 BMPRecent Labs Lab 02/24/230509 02/25/230531 02/28/230556 03/01/230359 03/02/230924 NA 149*   < >  144 146* 145* K 3.2*   < > 3.7 4.0 3.9 CL 114*   < > 114* 114* 113* CO2 26   < > 23 25 24  ANIONGAP 9   < > 7 7 8  BUN 23   < > 15 19 12  CREATININE 0.36*   < > 0.22* 0.28* 0.28* CALCIUM 10.3*   < > 9.8 9.8 10.8* MG 2.0  --  1.9 2.0  --  PHOS  --   --   --  2.2  --   < > = values in this interval not displayed.  LFTsRecent Labs Lab 02/24/230509 02/25/230531 02/26/230455 02/28/230556 03/01/230359 03/02/230924 ALT 189* 147*   < > 77* 58* 43* AST 200* 135*   < > 57* 37* 32 ALKPHOS 103 124*   < > 141* 140* 132* BILITOT 0.4 0.3   < > 0.2 0.2 0.3 BILIDIR 0.2 <0.2  --   --   --   --  ALBUMIN 2.2* 2.1*   < > 2.2* - 2.4* 2.4* 2.6*  < > = values in this interval not displayed. CoagsRecent Labs Lab 02/28/230556 INR 1.10 PTT 26.8  GlucoseRecent Labs Lab 03/01/230839 03/01/231217 03/01/231657 03/01/232131 03/02/230846 03/02/230924 GLU 111* 127* 92 101* 89 119*  A1CNo results found for: HGBA1C Diagnostics: Imaging:No new radiology.Echo:No results found for this or any previous visit.ECG/Tele Events: No ECG ordered todayAssessment: 66 y.o. female PMHx RA on MTX (off since November), recent prolonged hospitalization in [place] for perforated diverticulitis s/p ex lap/R hemicolectomy/end ileostomy, persistent encephalopathy with multiple stable MRIs, surgical incision abscess s/p drainage, G-tube placement, DVT on AC a/w persistent encephalopathy, fevers and leukocytosis. ID consulted to help with source of infection, improving on IV abx.  Course c/b G-tube leaking, hypernatremia, transaminitis. She has ongoing encephalopathy without significant improvement, work up still ongoing. Plan: # Acute encephalopathy- Work up so far unrevealing- s/p LP today, initial results unremarkable, will follow up pending tests. Will need to r/o autoimmune process vs malignancy- Neurology signed off yesterday. I discussed with attending Dr. Celedonio Savage today and asked for re-review as we did not have any neuro imaging available to [place] except a dry [place] head up until today so these were not reviewed by the team and patient cannot move her legs for unclear reason (baseline was completely independent prior to November). Dr. Celedonio Savage will re-review the case and get back to [place]- Will also consult hematology and rheumatology given new finding of elevated FLC ratio, ?cryoglobulenemia, +ANA. 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 No mention for lytic lesions on the reports, but awaiting internal radiology review - Hematology consulted for evaluation# Multiple peritoneal nodules- Concerning for possible carcinomatosis- IR re-consulted for biopsy now that we have the outside images available to compare# Hx of rheumatoid arthritis- Has been off MTX since November after her acute illness- I put a request to  obtain records from her rheumatologist in [place] Dr. Zenovia Jordan- Rheumatology consulted as above# Bilateral LE DVT- Diagnosed during recent hospitalization in [place]. Seen on [place] from 01/22/2022. Possibly provoked in the setting of recent surgery but also now with concerns for malignancy- Cont lovenox# Fevers, leukocytosis- Now resolved, no clear source identified. Wonder if relate to DVT vs malignancy vs autoimmune? - Treated with 5 days of zosyn per IDDVT ppx: LovenoxDiet: Tube feedsI updated her sister Danielle [name] today. Per her, Ms Chandran was independent prior to her illness in November but since then her mental status and strength had major decline and never recovered. Discussed with neurology and rheumatology today. Medical decision making for this patient was high.I spent 50 minutes today on this encounter before, during and after the visit, reviewing labs and records, evaluating and examining the patient, entering orders and documenting the visit. Electronically Signed:Maree Ainley, MDHospitalist Attending PhysicianYale Kaiser Fnd Hosp [facility] York StreetNew [place], [place] [address]/2/202311:47 AMDISCLAIMER: This chart was created using M-Modal dictation software. Efforts were made by me to ensure accuracy, however some errors may be present due to limitations of this technology and occasionally words are not transcribed as intended.Addendum:Neurology team re-evaluated patient and available imaging. Although critical illness myopathy is a possibility but it's atypical for only LE weakness. Her exam was limited due to pain so unable to fully assess her strength per neuro eval. Given her recent prolonged hospitalization with multiple infections, Dr. Celedonio Savage recommended to obtain total spine MRI with contrast to rule out any potential fluid collection that could explain her symptoms. I was also alerted that the sample we sent for the autoimmune panel is not in process. Initial lab response was they did not have enough sample although we sent +14 cc. After contacting multiple lab departments (about 1 hour of multiple calls) we were able to locate 2 cc of the sample which will likely be enough to run the test. We will follow up with the lab to ensure sample is accepted and processed. Otherwise we may need to repeat LP. Greatly appreciate multidisciplinary assistance in this case.

## 2022-02-20 NOTE — Other
All outside images are now uploaded to PowerShare and in process to push to PACS with secondary inpatient radiology read. Neurology team will re-review the case. Electronically Social research officer, government, MDHospitalist Attending PhysicianYale Town Center Asc LLC, [place] [address]/2/202311:34 AM

## 2022-02-20 NOTE — Other
-  CONSULT  REQUEST  DOCUMENTATION-[facility] NOTE-Type of consult: [facility] Interventional Radiology -New Consult: [id number] Danielle [name] /Location: 9831/9831-A / *Brief Clinical Question: Re-consult for biopsy of peritoneal nodules, IR asked for priot images which are now uploaded to PowerShare. Also would like liver biopsy/Callback Cell Phone: [phone]** / Please confirm receipt of this message by texting back ?OK?-2 Danielle [name] Page sent to Interventional Radiology [facility] [phone] at 11:41 AM.-Danielle [name] Hudd3/2/202311:40 [facility] [phone]

## 2022-02-20 NOTE — Plan of Care
Plan of Care Overview/ Patient Status    Problem: Adult Inpatient Plan of CareGoal: Readiness for Transition of CareOutcome: Interventions implemented as appropriate CM spoke with [name] (sister) on the phone to get an update on her progress in obtaining conservatorship for the pt. [name] explained that she has started gathering information on the requirements to obtain guardianship which includes some associated fees that she will have to work on obtaining. She has reached out to contacts in [place] to obtain information for the pt's T-19 application and has not received a response. [name] will attempt to submit the application with the information she has. CM reviewed that CM will not be able to secure LTC placement until the pt's has her [place] Medicaid and a conservator who can make decisions for her. [name] expressed her understanding. Elinor Parkinson, RN, MSNCare ManagerMHB: [phone]

## 2022-02-20 NOTE — Plan of Care
Plan of Care Overview/ Patient Status    A&Ox1, VSS. Afebrile. Nursing made multiple attempts to collect AM labs but patient refused despite education and encouragement. Nursing will inform day shift team. Resting with bed in lowest position, call light within reach, and fall bundle in place.

## 2022-02-20 NOTE — Progress Notes
[facility] HospitalPod[facility] Surgery	Podiatry Progress NoteDate: 3/2/2023Patient Name: Danielle [name]: [name] Attending Provider: Truman Hayward, MDCC: Bilateral foot woundsSubjective: HPI: Danielle [name] is a 66 y.o. female with PMHx RA on MTX, recent prolonged hospitalization in [place] for perforated diverticulitis s/p ex lap/R hemicolectomy/end ileostomy, persistent encephalopathy with multiple stable MRIs, surgical incision abscess s/p drainage, G-tube placement, DVT on AC a/w persistent encephalopathy, fevers and leukocytosis. Podiatry consulted for bilateral foot wounds. Patient in AMS, unable to respond to questions at this time. Patient in offloading boots. Denies n/v/f/c/sob/cp.?Interval Hx: Patient seen at bedside this AM with attending Dr. Taquan Bralley, NAD. NAEO. Patient in AMS; unable to respond to questions.PMH: PSH: No past medical history on file. No past surgical history on file. Social History: Family History: Social History Tobacco Use ? Smoking status: Not on file ? Smokeless tobacco: Not on file Substance Use Topics ? Alcohol use: Not on file  No family history on file. Allergies: Patient has no known allergies.Review of Systems: Unable to assess 2/2 AMSVitals: Vitals:  02/20/22 0802 BP: 111/74 Pulse: (!) 94 Resp: 16 Temp: 97.3 ?F (36.3 ?C) Physical Exam: Gen: NADHead: NormocephalicPsych: AAO x 2Chest: CTAB, no r/r/wCardio: RRR, S1, S2, no m/r/gAbd: Soft NT, ND, +bsVascular: DP/PT pulses palpable. Normal CRT to all digits. Foot is warm to touch. Pedal hair growth is absentNeuro: Unable to assess MSK: Unable to ROM digits when asked, unable to participate in MMTDerm: deep tissue injury noted to the Right lateral foot without any open lesions. Superficial deep tissue injury at the right medial eminence. No SOI. Labs: Lab Results Component Value Date  WBC 6.8 02/19/2022  HGB 8.9 (L) 02/19/2022  HCT 30.40 (L) 02/19/2022  CREATININE 0.28 (L) 02/19/2022  BUN 19 02/19/2022  CO2 25 02/19/2022  PLT 274 02/19/2022  INR 1.10 02/18/2022  GLU 101 (H) 02/19/2022  K 4.0 02/19/2022  NA 146 (H) 02/19/2022  CL 114 (H) 02/19/2022  ALT 58 (H) 02/19/2022  AST 37 (H) 02/19/2022  TSH 1.670 02/17/2022  HSCRP 10.7 (H) 02/19/2022  SEDRATE 98 (H) 02/19/2022 Imaging: None Micro/Path: None Assessment/Plan: Danielle [name] is a 66 y.o. female with Right lateral foot and Left medial eminence deep tissue injuries, stable. No open wounds or SOI. ?- Dressing applied: allevyn pad to the Right lateral foot - No dressing necessary to the Left foot - Nursing dressing changes as above - Recommend WBAT to BLE- Antibiotics per primary team - none indicated per podiatry standpoint- No indication for podiatric surgical intervention at this time- Podiatry to sign off; reconsult prn for any acute changes or issues ?Seen with attending, Dr. Darral Dash page on-call podiatry resident with questions:?	SEE AMION FOR CALL SCHEDULE ?	Berton Lan, DPMPodiatric Surgery PGY-2Electronically Signed by Audrie Gallus, DPM, February 20, 2022 I saw and evaluated Danielle [name] with the resident. I agree with the findings and the plan of care as documented in the resident note.Electronically Signed by Donn Pierini, DPM, February 20, 2022

## 2022-02-20 NOTE — Other
-  CONSULT  REQUEST  DOCUMENTATION-[facility] NOTE-Type of consult: [facility] Hematology -New Consult: [name] [name] /Location: 9831/9831-A / Brief Clinical Question: pt with AMS and wekaness of unclear etiology, found to have elvated FLC ratio though no clear spike. Please eval if this can be hematologic in nature/Callback Cell Phone: [phone] / Please confirm receipt of this message by texting back ?OK?-1 - Mobile Heartbeat message sent to Merilynn Finland at 10:09 AM. Received response at 1015.-Noris Kulinski Dick3/2/202310:09 [facility] [phone]

## 2022-02-20 NOTE — Plan of Care
Danielle [name]					Location: EP 97/9831-A65 y.o., female				Attending: Truman Hayward, MD	Admit Date: 02/11/2022			[name] LOS: 9 days FOLLOW-UP NUTRITION ASSESSMENT Dietary Orders (From admission, onward)     Start     Ordered  02/13/22 1218  Diet Tube Feed No Tray  (Diet Tube Feed Panel)  DIET EFFECTIVE NOW      Comments: PIVOT 1.5 @ 1 carton bolus TID @9am ,1pm,5pm with 1.5 cartons at 9pm to total 5.5 cartons daily) Question Answer Comment Adult Tube Feed Formulas: Pivot 1.5 (BH,MH,YNH,SRC)  Feeding Route: G-Tube  Continuous Rate ml/hr: Other(see comments)  Initiate Nutrition Management Protocol (Yes/No?) Yes - Initiate Protocol    02/13/22 1218    No Known AllergiesANTHROPOMETRICS:Height:   70Admit wt: no recent wt to assess. Current wt: 79.4 kg (02/14/22) Dosing Wt: 78 kg (based on hamwi +10%)BMI: 25  (based on current wt) Additional wt information:Ht confirmed with pt: NO - pt not able to provide hx. Pt not in a bed with scale. Pt last known wt was 98.6 kg (12/13/2018). Wt loss confirmed with admit wt as pt no longer weighs 98 kg. Time line for wt loss in not clear and pt cannot report.  ?  Wt Readings from Last 20 Encounters: 12/13/18 98.6 kg ??NUTRITION-FOCUSED PHYSICAL EXAM:-Muscle Depletion: Moderate??????????? temples, clavicles, shoulders, interosseous, scapula, calf-Subcutaneous Fat Depletion: Moderate ??????????? orbital region, triceps, ribs?ESTIMATED NUTRITION REQUIREMENTS:Kcal/day: 1950-2340              (25 -30 kcal/kg)Protein/day: 94 - 117             (1.2-1.5gm/kg)Fluids/day: 1950-2340 mL/d (6mL/kcal) ONLY as medical and fluid status allows and only per team discretion. Needs based on: Dosing Wt (78 kg), ? Carcinomatosis, maintenance. NUTRITION ASSESSMENT: PT EMR reviewed for follow up assessment. Pt last seen in ED for tube feed consult. Pt  with hx of RA, recent prolonged admit to OSH and d/c'd to rehab on 2/15 [place]. Pt had recent PEG placed on 2/3. Was tolerating bolus feeds PTA. Pt continues to tolerate tube feeds well over this past week. Pt appears to be meeting needs with consistent supportive tube feed intake. Ileostomy in place. 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 Noted to have bilateral DVT's.    ?Pt not able to provide hx. Pt was brought by sister from SNF to be evaluated for LTAC in [place].  Pt placed on standard formula in ED. PTA pt was on low CHO option not available on [facility] formulary. No noted DM. Labs reviewed and notable for persistent high sodium, stable potassium, stable BUN/Cr, and stable glucose on goal rate feeds. No treatment provided for glucose control. Will maintain current feeding regimen as pt appears to be tolerating and meet 100% of estimated requirements.  ?Cultural/Religious/Ethnic NeeTIBds: Unknown?NUTRITION DIAGNOSIS:Inadequate oral intake r/t changes in ability PTA aeb decision to place PEG feeding tube to support nutrition. - will resolve now on supportive tube feeds. ?Severe malnutrition r/t depletion aeb moderate muscle and fat depletion.- continues ?INTERVENTIONS/RECOMMENDATIONS: Enteral/ Parenteral Nutrition?	Formula- PIVOT 1.5 @ 1 carton bolus TID (@9am ,1pm,5pm) with 1.5 cartons at 9pm to total 5.5 cartons daily)-	Provides: 1.3L, 1955kcal, 121gram Protein, 986 mL free water/d-	Additional Free water @ 960 mL/d as medical/ fluid status allows at Teams discretion. Pt getting free water through flushes with every 8 hours. ?	Goal:  -	Patient will advance/ tolerate goal rate TF/ TPN prior to next nutrition assessment- met will d/c. -	Patient will receive >  90% of goal volume TF/ TPN prior to next nutrition assessment- met will continue ??Discharge and Transfer of Nutrition Care to New Setting or Provider:?	 Nutrition Prescription- Discharge needs not determined at this time will continue to follow ?MONITORING / EVALUATION:Food/Nutrition-Related Outcomes:??????????? food and nutrient intake/administration.Anthropometric Outcomes:??????????? weightBiochemical Data, Medical Tests, and Procedure Outcomes:??????????? Labs (General chemistry, renal labs, Glucose).Nutrition-Focused Physical Finding Outcomes:??????????? Physical appearance, muscle and fat wasting, swallow function, appetite.?Electronically signed by Sherrilee Gilles, RD, March 2, 2023MHB- [phone] Please note: Nutrition is a consult only service on weekends and holidays.  Please enter a consult in EPIC if assistance is needed on a weekend or holiday or via MHB [phone] to contact the covering RD.

## 2022-02-21 LAB — COMPREHENSIVE METABOLIC PANEL
BKR A/G RATIO: 0.5 — ABNORMAL LOW (ref 1.0–2.2)
BKR ALANINE AMINOTRANSFERASE (ALT): 34 U/L (ref 10–35)
BKR ALBUMIN: 2.5 g/dL — ABNORMAL LOW (ref 3.6–4.9)
BKR ALKALINE PHOSPHATASE: 113 U/L (ref 9–122)
BKR ANION GAP: 7 (ref 7–17)
BKR ASPARTATE AMINOTRANSFERASE (AST): 23 U/L (ref 10–35)
BKR AST/ALT RATIO: 0.7
BKR BILIRUBIN TOTAL: 0.2 mg/dL (ref <=1.2)
BKR BLOOD UREA NITROGEN: 11 mg/dL (ref 8–23)
BKR BUN / CREAT RATIO: 45.8 — ABNORMAL HIGH (ref 8.0–23.0)
BKR CALCIUM: 10.3 mg/dL — ABNORMAL HIGH (ref 8.8–10.2)
BKR CHLORIDE: 113 mmol/L — ABNORMAL HIGH (ref 98–107)
BKR CO2: 25 mmol/L (ref 20–30)
BKR CREATININE: 0.24 mg/dL — ABNORMAL LOW (ref 0.40–1.30)
BKR EGFR, CREATININE (CKD-EPI 2021): >60 mL/min/{1.73_m2} (ref >=60)
BKR GLOBULIN: 5.1 g/dL — ABNORMAL HIGH (ref 2.3–3.5)
BKR GLUCOSE: 141 mg/dL — ABNORMAL HIGH (ref 70–100)
BKR POTASSIUM: 3.5 mmol/L (ref 3.3–5.3)
BKR PROTEIN TOTAL: 7.6 g/dL (ref 6.6–8.7)
BKR SODIUM: 145 mmol/L — ABNORMAL HIGH (ref 136–144)

## 2022-02-21 LAB — T4, FREE: BKR FREE T4: 1.35 ng/dL

## 2022-02-21 LAB — CERULOPLASMIN: BKR CERULOPLASMIN: 28 mg/dL (ref 18–51)

## 2022-02-21 LAB — TREPONEMA PALLIDUM (SYPHILIS) ANTIBODY W/REFLEX
BKR TREPONEMA PALLIDUM ANTIBODY INITIAL RESULT: <0.1 {index}
BKR TREPONEMA PALLIDUM ANTIBODY TOTAL, SERUM: NONREACTIVE

## 2022-02-21 LAB — CA 125: BKR CA 125 (YH ROCHE): 10.7 U/mL (ref <=38.1)

## 2022-02-21 LAB — VITAMIN B1, WHOLE BLOOD: VITAMIN B1, WHOLE BLOOD: 325 nmol/L — ABNORMAL HIGH (ref 78–185)

## 2022-02-21 LAB — VITAMIN D 25 (VITAMIN D STATUS)(MINMETABLAB)(YH): VITAMIN D 25-HYDROXY: 48 ng/mL (ref 20–50)

## 2022-02-21 LAB — T3: BKR T3 TOTAL: 140 ng/dL

## 2022-02-21 LAB — CANCER ANTIGEN 19-9: BKR CA 19-9 (YH ROCHE): <9 U/mL (ref <=35)

## 2022-02-21 LAB — THYROID PEROXIDASE ANTIBODY: BKR THYROID PEROXIDASE ANTIBODIES: 17 [IU]/mL (ref <34)

## 2022-02-21 LAB — PTH, INTACT WITHOUT CALCIUM: BKR PARATHYROID HORMONE INTACT: 28 pg/mL (ref 15.0–65.0)

## 2022-02-21 LAB — CEA: BKR CARCINOEMBRYONIC ANTIGEN (YH ROCHE): 2.3 ng/mL

## 2022-02-21 LAB — HIV-1/HIV-2 ANTIBODY/ANTIGEN SCREEN W/REFLEX     (BH GH LMW YH): BKR HIV 1 AND 2 ANTIBODY/HIV-1 ANTIGEN SCREEN: NEGATIVE

## 2022-02-21 MED ORDER — SODIUM CHLORIDE 0.9 % INTRAVENOUS SOLUTION
INTRAVENOUS | Status: AC
Start: 2022-02-21 — End: ?
  Administered 2022-02-21: 19:00:00 via INTRAVENOUS

## 2022-02-21 NOTE — Other
[facility]		    [facility]													Hematology NoteInpatient Attending: Truman Hayward, MDHospital Day: 9Consult Questionpt with AMS and wekaness of unclear etiology, found to have elvated FLC ratio though no clear spikeInterval Events Danielle [name] is a 66 Yo F with a medical history significant for RA on MTX, recent prolonged hospitalization in [place] for perforated diverticulitis with pneumoperitoneum s/p ex lap with R hemicolectomy and end ileostomy, LE DVT on eliquis, ongoing encephalopathy w/ unremarkable MRI and LP performed 3/1 , fevers and leukocytosis. Hematology is asked to evaluate, interpret and further consider an elevated K/L ration found on FLC analysis.Per chart review she was admitted on 11/17/21 to [facility] where she was found to have pneumoperitoneum and diverticular perforation w/ sigmoid colon stricture. She underwent an exlap with R hemicolectomy and end ileostomy. She did have re-accumulation of fluid with abscesses that were subsequently drained and she was placed on TPN. Repeat imaging subsequently demonstrated a fistula. During that hospitalization she was noted to have an encephalopathy that was felt to be metabolic. With MRI showing no pathology. Due to her ongoing encephalopathy Psych was consulted and she was deemed to not have capacity. Ethics was also involved due to challenges with GOC. The  Challenges largely centered around nutrition and potential PEG placement. She ultimately hd PEG placed, with subsequent dislodgement and replacement and was initiated on tube feeds. She later developed a fever, blood cultures demonstrated staph epidermidis.  PICC line was removed. She also had repeat abdominal imaging that showed resolution of the RLQ fluid, but did demonstrate new bilateral DVTs and she was started on heparin with subsequent transition to eliquis.After this long hospital course she was discharged to SNF on 02/05/22. She was subsequently brought to [facility] on 2/21 by her sister for evaluation for LTAC.During this hospitalization she has had ongoing leukocytosis and altered mental status as summarized below. #Leukocytosis- Resolved#transaminitis#peritoneal nodulesOn presentation she was initially placed on CTX in the ED. Blood cultures from 2/22 with no growth to date. Procal was negative. She was treated with 5 days of zosyn (2/21-/25) and since admission has not had recurrent temperature with T. Max on 2/21 100.3. No urine culture was sent, but initially UA was not concerning for infection. [place] of the abdomen demonstrated multiple per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, but no infectious source, nodules have not undergone further evaluation yet as they are trying to get outside records to see if they are new on imaging and have further review by IR. On the [place] abdomen there was note of thickened hypodense endometrium, no comment on R Ovary (s/p L oophorectomy).  ID was also consulted on 2/24 to evaluate for infectious source and given slight transaminits, they did recommend RUQ [place] but this has not been obtained. Hepatology was also consulted for the transaminitis and Abdominal US complete showed increase in echogenicity concerning for hepatic steatosis, and no thrombosis. Autoimmune workup was also sent that resulted in positive ANA .#Altered mental statusShe has had ongoing altered mental status since [place]. 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 Neurology was consulted, and she underwent LP and ongoing with up with consideration for malignant or autoimmune cause. She was also evaluated by psychiatry and this was not felt to be catatonic in nature.Meds: Scheduled Meds:Current Facility-Administered Medications  Medication Dose Route Frequency Provider Last Rate Last Admin ? [Held by provider] apixaban (ELIQUIS) tablet 5 mg  5 mg Per G Tube Q12H Littie Deeds, MD     ? chlorhexidine gluconate (PERIDEX) 0.12 % solution 15 mL  15 mL Mouth/Throat BID Dorisann Frames, MD   15 mL at 02/20/22 5621 ? Elemental iron 60 mg (300 mg Ferrous sulfate) / 5 mL oral liquid 45 mg  45 mg Per G Tube BID Dorisann Frames, MD   45 mg at 02/20/22 3086 ? enoxaparin (LOVENOX) injection 80 mg  80 mg Subcutaneous Q12H Chaar, Abdelkader, MD   80 mg at 02/20/22 5784 ? famotidine (PEPCID) suspension (8 mg/mL) 20 mg  20 mg Per G Tube Daily Dorisann Frames, MD   20 mg at 02/20/22 6962 ? folic acid (FOLVITE) tablet 1 mg  1 mg Per G Tube Daily Dorisann Frames, MD   1 mg at 02/20/22 9528 ? niacin Immediate Release tablet 100 mg  100 mg Per G Tube Nightly Dorisann Frames, MD   100 mg at 02/19/22 2141 ? pyridoxine (vitamin B6) (B-6) tablet 100 mg  100 mg Per G Tube Daily Dorisann Frames, MD   100 mg at 02/20/22 4132 ? [Held by provider] rosuvastatin (CRESTOR) tablet 10 mg  10 mg Per G Tube Nightly Dorisann Frames, MD   10 mg at 02/14/22 2245 ? sodium chloride 0.9 % flush 3 mL  3 mL IV Push Q8H Dorisann Frames, MD   3 mL at 02/18/22 2204 ? thiamine (VITAMIN B1) tablet 100 mg  100 mg Per G Tube BID Dorisann Frames, MD   100 mg at 02/20/22 4401 Continuous Infusions:PRN Meds:acetaminophen, dextrose (GLUCOSE) oral liquid 15 g **OR** fruit juice **OR** skim milk, dextrose (GLUCOSE) oral liquid 30 g **OR** fruit juice, dextrose injection, dextrose injection, glucagon, sodium chlorideNo Known AllergiesObjective: Vitals:Temp:  [97.3 ?F (36.3 ?C)-97.9 ?F (36.6 ?C)] 97.9 ?F (36.6 ?C)Pulse:  [94-103] 103Resp:  [16-18] 18BP: (111-118)/(74-78) 118/76SpO2:  [94 %-100 %] 100 %Device (Oxygen Therapy): room air  I/O's:Intake/Output Summary (Last 24 hours) at 02/20/2022 1621Last data filed at 02/19/2022 2000Gross per 24 hour Intake 1461 ml Output 650 ml Net 811 ml  Physical Exam:Gen: chronically Ill, no acute distress, alert and oriented only to personNeuro: limited due to limited ability to follow instructions, CN grossly intact, face symmetric, flickered movement in LE in response to stimulus , moving arms but not able to test strength to voice, no tremors, sensation grossly intactHEENT: PERRL, moist mucous membranesCV: Regular rate and rhythm, no murmurs, rubs, or gallopsPulm: Clear to ascultation, no wheezes or cracklesAbd: soft, non-tender, non-distended, 2 ostomy bags in place and scar at midlineExt: Warm, well-perfused, non-edemetous Skin: no rashes appreciable Labs: Recent Labs Lab 02/27/230457 03/01/230359 03/02/230924 WBC 6.2 6.8 6.0 HGB 8.7* 8.9* 10.3* HCT 29.30* 30.40* 35.20 PLT 259 274 307  Recent Labs Lab 02/25/230531 03/01/230359 03/02/230924 NEUTROPHILS 57.0 49.5 64.1  Recent Labs Lab 02/28/230556 02/28/230851 03/01/230359 03/01/230839 03/02/230924 03/02/231246 NA 144  --  146*  --  145*  --  K 3.7  --  4.0  --  3.9  --  CL 114*  --  114*  --  113*  --  CO2 23  --  25  --  24  --  BUN 15  --  19  --  12  --  CREATININE 0.22*  --  0.28*  --  0.28*  --  GLU 94   < > 113*   < > 119* 109* ANIONGAP 7  --  7  --  8  --   < > = values in this interval not displayed.  Recent Labs Lab 02/28/230556 03/01/230359 03/02/230924 CALCIUM 9.8 9.8 10.8* MG 1.9 2.0  --  PHOS  --  2.2  --   Recent Labs Lab 02/25/230531 02/26/230455 02/28/230556 03/01/230359 03/02/230924 ALT 147*   < > 77* 58* 43* AST 135*   < > 57* 37* 32 ALKPHOS 124*   < > 141* 140* 132* BILITOT 0.3   < > 0.2 0.2 0.3 BILIDIR <0.2 --   --   --   --   < > = values in this interval not displayed.  Recent Labs Lab 02/24/230509 02/25/230531 02/26/230455 02/28/230556 PTT 57.1* 49.8*  --  26.8 LABPROT  --  11.9 12.0 11.4 INR  --  1.15 1.16 1.10  Iron 17TIBC 214Ferritin 113Sat 8%B12 1415-->697Folate 32. 0.28-TSH normalCRP 10.7ESR 98LD 232RF in processCryoglobulin in processComplement 3- 118Complement- 18ANA 1:160, speckled cytoplasmic fluorescence in HEP-2Smooth muscle IgG 33 (ref <20)- dsDNA and ENA pendingIgG 3039IgA 327IgM 196IgG subclasses- IgG 12-1492- IgG 2- 1215- IgG3- 209- IgG 4- 92.3Kappa 14.9Lambda 6.29Ratio 2.37Serum IFEA sharp band is present in all lanes and is not specific for a single immunoglobulin type. This pattern is sometimes suggestive of a cryoglobulin. SPEPNo discrete abnormal bands. Recommend correlation with concomitant serum immunofixation electrophoresis (IFE) and serum free light chain measurement.CSF flowNormal CSF phenotype analysis. Low cellularity specimen with normal phenotype T cells and virtually no B cells detected.CSF - protein 32.3- glucose 67- 1 nucleated cell in tube 4, 2 nucleated cells in tube 1- autoimmune panel pending- cytology pendingMicro:Lab Results Component Value Date  LABBLOO No Growth after 5 days of incubation 02/12/2022  LABBLOO No Growth after 5 days of incubation 02/12/2022 Blood2/22 no growth to date Respiratory Urine OtherCSF 2/27 pendingHCV antibody positive, with negative PCR for viral load Diagnostics:US Abdomen Pelvis Vascular Study Complete [facility] LM)Result Date: 02/18/2022 No evidence of portal vein or hepatic vein thrombosis. Increased liver echogenicity, which is most commonly noted with hepatic steatosis. However, please note that more advanced forms of liver disease, including hepatic fibrosis/early cirrhosis may have a similar imaging appearance. [facility] Notify System Classification: Routine Report Initiated By:  Kreg Shropshire, MD Reported And Signed By: Verl Bangs, MD  [facility] [place] ChestPatchy groundglass opacities in bilateral lower lobes with 4 mm right lower lobe lung nodule, findings may represent infectious/inflammatory etiology in a proper clinical setting. Short-term follow-up can be obtained for resolution. Otherwise consider follow-up chest [place] in 12 months for stability.[place] HeadSince the prior exam, there has been a significant increase in ventricular size and sylvian fissures. In the appropriate clinical setting normal pressure hydrocephalus can be considered.[place] abdomen ?No collection in abdomen or pelvis. ?Multiple peritoneal nodules, 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. Correlation with prior imaging is recommended.ECG/Tele Events: SinusAssessment and Plan Mrs. Danielle [name] is a 66 Yo F with a medical history significant for RA on MTX, recent prolonged hospitalization in [place] for perforated diverticulitis with pneumoperitoneum s/p ex lap with R hemicolectomy and end ileostomy, LE DVT on eliquis, ongoing encephalopathy w/ unremarkable MRI and LP performed 3/1 , fevers and leukocytosis. Hematology is asked to evaluate, interpret and further consider an elevated K/L ration found on FLC analysis.Overall her clinical picture is concerning for an ongoing inflammatory process, less likely to be infectious given her negative workup. As for the elevated K/L  ratio this is most likely reactive in nature. She has no monoclonal components on the IFE or SPEP. A slight elevation of the K/L ratio can be observed in renal disease or chronic inflammatory states, and the latter is more likely in her case. This could be AI with the elevated ANA and known RA, or inflammatory from a malignant process given the peritoneal nodules, as well as thickened endometrium. One could consider that her prior diverticulitis/perf was related to a malignant process that was unknown at the time. In light of the hypercalcemia (corrected at 29.), this could support a malignant process as well, though she does have hyperalimentation  She appears to have had a colonoscopy in 2018, but no results are available in 2018. She has 1 remaining ovary with no imaging detailing any pathology. Last mammogram without any concerning lesions. If there is a malignant process, that is driving her presentation there could be encephalopathy as a paraneoplastic syndrome.Inflammatory PictureElevated K/L with no monoclonal component- follow up cryoglobulin and RF- consider sending HIV- consider anti-CCP antibody - appreciate rheumatology recs iso of ANA titer, follow up dsDNA and ENA- please send UPEP (24 hour if she will remain in house) and UIFE Peritoneal nodules- would obtain biopsy, per team waiting review of prior imaging with IR prior to proceeding- consider CEA, CA19-9 and CA-125- would try to obtain colonoscopy results from 2018- would review ovaries on imaging with radiology to see if any concerning findings- would test for TB, can consider quantiferon gold- for hypercalcemia-> would send PTH and PTH-RP for hypercalcemia, vit D and 1,25 vit DAMS- consider repeat [place] head given prior ventricular enlargement, though now s/p LP- consider trep antibody, HIV- consider empiric high dose thiamine prior to B1 level returning given poor nutritional status with recent surgeries- follow up B1- follow up the autoimmune panel on CSF and cultures, cytology- consider paraneoplastic panelAnemiaLabs consistent with IDA and is hypoproliferative, could be in the setting of GI losses/bleed- please send blood smear - consider copper and zinc level given recent limitations with nutrition- would consider IV iron for her deficiency- IV sucrose 200 mg X4 Signed:Bartolo Montanye Freida Busman, MDInternal Medicine - [id number] #for ContactMarch 2, 2023

## 2022-02-21 NOTE — Other
Neurology Consult Note:?Ms. Rambo is a 66 year old woman who is transferred from [place] where she was extensively workup by neurology team for altered mental status, in the setting of diverticulitis in Nov 2022, complicated by bowel perforation and surgical incision abscess, staph bacteremia and DVT,requiring Eliquis. Fortunately, all her workup including MRI brain and total spine and EEG returns unremarkable. However, she was noted to have peritoneal nodules on pan [place] for which biopsy is planned. There was a concern of carcinomatous meningitis therefore LP was ordered which was done that didn't show any evidence of CNS infection. Paraneoplastic and autoimmune encephalopathy workup in CSF is pending.Overall,she noted interval improvement in her cognitive status. She is awake, oriented to place and person but not with time. No dysarthria or language dysfunction. Cranial nerve functions are intact. She continues to report severe pain around pelvic area an. d not moving her legs at all. Passive movments are also not possible due to severe pain therefore unable to assess the motor strength. DTR are preserved in UEs and diminished in LEs. She reports impaired sensations in both legs upto groin. Given the prolonged complicated course of abdominal infections, bacteremia, it is imperative to rule out epidural abscess. It is uncertain that if pain is the main reason limiting her legs movements. Unable to comment on lower extremities strength due to limited exam. Other neurological explanation could be longstanding thiamine deficiency resulting in severe peripheral neuropathy vs critical illness neuromyopathy however the latter is less likely due to preserved upper extremities strength. Unfortunately unable to do EMG due to significant pain and patient is refusing any exam including a detailed clinical exam Plan:MRI total spine with and without contrast for epidural abscessShe will continue with thiamine replacementPlease call neurology once MRI spine is completed and CSF autoimmune encephalopathy and paraneoplastic panel is back?Greater than 45 minutes was spent reviewing patient's history, laboratory test results, reviewing imaging, examining and discussing the differential diagnosis, work up and management.  More than 25 minutes of this encounter was spent face to face and counseling the patient.?Gaye Pollack, Attending Neurologist3/2/23

## 2022-02-21 NOTE — Plan of Care
Plan of Care Overview/ Patient Status    A&Ox1, VSS.  Ostomy CDI x2 Attempted to collect urine for 24-hour collection as ordered using purewick and underwear to keep in place. However, patient removes almost instantly and is not receptive to education/instructions. Resting with bed in lowest position, call light within reach, and fall bundle in place.

## 2022-02-21 NOTE — Other
[place] [facility]YscRHEUMATOLOGY CONSULT SERVICEMarch 2, 2023History provided by: the patient, a relative and EMR reviewHistory limited by: patient/patient representative poor historianReason for Consult: A consult was requested by: Truman Hayward, MD with the question of cryoglobulinemia in setting of elevated ANA, IgG levels, and FLC ratio.HPI: Danielle [name] is a 66 y.o. 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 11/17/21: Admitted to [facility] for large volume pneumoperitoneum 2/2 to diverticular perforation and distal sigmoid colon stricture requiring ex-lap, extended R hemicolectomy, and end ileostomy (11/17/21). Hospital course was complicated by persistent AMS, attributed to metabolic encephalopathy. The patient was noted to have an extensive work-up with abdominal [place], head [place], and brain MRI only revealing pericolic gutter and a peripancreatitic fluid collection treated with antibiotics. Lab work was notable for a folic acid deficiency, which was treated folate supplementation. Repeat abdominal [place] on 12/18/2021 noted an abscess, which was subsequently drained. Persistent fluid collections were noted on repeat [place] imaging in January 2023. MRI imaging of the brain, C-Spine, and Thoracic-Spine were repeated with no new findings noted. During admission at [facility], the patient was noted to have bilateral DVTs, now on eliquis. After prolonged family and GOC discussion, PEG placement was performed on 01/24/22. Without noted improvement of encephalopathy, the patient was discharged to SNF at [facility] on 02/05/22. The patient was then transferred to [facility] on 02/11/22 for LTACH. Since coming to [facility], the patient was initially treated for concerns of persistent encephalopathy and infection with a 5 day course of zosyn. Transaminitis was notable on admission, though this has trended down while in hospital.  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 Oncology was recently consulted for further insight. 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 RUQ U/S showed no evidence of portal vein or hepatic vein thrombosis with increased liver echogenicity consistent with hepatic steatosis.   Psychiatry was consulted on 02/17/22 and does not believe this AMS is due to catatonia. LP performed on 2/28 was unremarkable. Neurology saw the patient and due to the previous extensive neurologic work-up, did not see any reason for further inpatient testing. Lumbar puncture was unrevealing, including flow cytometry. Pertinent Rheumatology Labs: ANA (1:160, speckled pattern), CRP elevated at 10.7, ESR elevated at 98, LD 232, C3/C4 WNL, IgG elevated at 3,235, RF pending, anti-CCP pending, Nuclear Antigen Abs pending, dsDNA Pending, cryoglobulin pending, and IFE positive for sharp band that may be suggestive of a cryoglobulin. Additionally, patient has marked hypercalcemia today. XR Hips noted minimal degenerative changes of bilateral hip joints. Her rheumatologist is Dr. Zenovia Jordan in [facility]. The patient has a history of rheumatoid arthritis, but has not been on methotrexate for at least the past month. After discussion with sister, the sister notes diagnosis in 1984/1985. The patient has been following with a rheumatologist since that time. In the room today, history was significantly limited on exam. Patient able to follow commands and answer her name, though believes she is currently in [place].  She remembers having to see many rheumatologists in the past, though trailed off and was unable to discuss further. The patient was able to endorse dry eyes and xerostomia. The patient was able to deny joint pain, joint swelling, oral ulcers, rashes, diplopia, blurry vision, eye redness, eye pain, chest pain, dyspnea, cough, or hemoptysis. Any other questions were met with a pause and then the patient saying anyways. Of note, sister has seen an improvement in the patient's mental status since transferring to [facility]. Past Medical History:- Bilateral LE DVT- Rheumatoid Arthritis- Large Volume Pneumoperitoneum 2/2 to diverticular perforation and distal sigmoid colon stricture - HyperlipidemiaPast Surgical History:- Extended R hemicolectomy and End Ileosteomy- Oophorectomy of the Left OvaryHome Medications:Outpatient Medications Marked as Taking for the 02/11/22 encounter [facility] Encounter) Medication Sig Dispense Refill ? acetaminophen (TYLENOL) 160 mg/5 mL elixir 500 mg by Per G Tube route every 6 (six) hours as needed.   ? acetaminophen (TYLENOL) 500 mg tablet Give 2 tablets via PEG-tube every 6 hours as needed   ? apixaban (ELIQUIS) 5 mg tablet 1 tablet (5 mg total) by Per G Tube route every 12 (twelve) hours.   ? chlorhexidine gluconate (PERIDEX) 0.12 % solution Use as directed 15 mLs in the mouth or throat 2 (two) times daily.   ? Elemental iron 60 mg (300 mg Ferrous sulfate) / 5 mL oral liquid Give 3.7 mL by g-tube two times daily   ? folic acid (FOLVITE) 1 mg tablet 1 tablet (1 mg total) by Per G Tube route daily.   ? insulin lispro (ADMELOG, HUMALOG) 100 unit/mL vial Inject under the skin 3 (three) times daily before meals. Use per sliding scale.   ? niacin 50 mg Immediate Release tablet 2 tablets (100 mg total) by Per G Tube route at bedtime.   ? pantoprazole (PROTONIX) 40 mg GrPS oral suspension 1 packet (40 mg total) by Per G Tube route daily.   ? psyllium (METAMUCIL) packet Give 1 packet via g-tube two times daily   ? pyridoxine, vitamin B6, (VITAMIN B-6) 100 mg tablet 1 tablet (100 mg total) by Per G Tube route daily.   ? rosuvastatin (CRESTOR) 10 mg tablet 1 tablet (10 mg total) by Per G Tube route nightly.   ? therapeutic multivitamin liquid 15 mLs by Per G Tube route daily.   ? thiamine (VITAMIN B1) 100 mg tablet 1 tablet (100 mg total) by Per G Tube route 2 (two) times daily.   Inpatient Medications:SCHEDULED: Current Facility-Administered Medications Medication Dose Route Frequency Provider Last Rate Last Admin ? [Held by provider] apixaban (ELIQUIS) tablet 5 mg  5 mg Per G Tube Q12H Littie Deeds, MD     ? chlorhexidine gluconate (PERIDEX) 0.12 % solution 15 mL  15 mL Mouth/Throat BID Dorisann Frames, MD   15 mL at 02/20/22 1610 ? Elemental iron 60 mg (300 mg Ferrous sulfate) / 5 mL oral liquid 45 mg  45 mg Per G Tube BID Dorisann Frames, MD   45 mg at 02/20/22 9604 ? enoxaparin (LOVENOX) injection 80 mg  80 mg Subcutaneous Q12H Chaar, Abdelkader, MD   80 mg at 02/20/22 5409 ? famotidine (PEPCID) suspension (8 mg/mL) 20 mg  20 mg Per G Tube Daily Dorisann Frames, MD   20 mg at 02/20/22 8119 ? folic acid (FOLVITE) tablet 1 mg  1 mg Per G Tube Daily Dorisann Frames, MD   1 mg at 02/20/22 1478 ? niacin Immediate Release tablet  100 mg  100 mg Per G Tube Nightly Dorisann Frames, MD   100 mg at 02/19/22 2141 ? pyridoxine (vitamin B6) (B-6) tablet 100 mg  100 mg Per G Tube Daily Dorisann Frames, MD   100 mg at 02/20/22 1610 ? [Held by provider] rosuvastatin (CRESTOR) tablet 10 mg  10 mg Per G Tube Nightly Dorisann Frames, MD   10 mg at 02/14/22 2245 ? sodium chloride 0.9 % flush 3 mL  3 mL IV Push Q8H Dorisann Frames, MD   3 mL at 02/18/22 2204 ? thiamine (VITAMIN B1) tablet 100 mg  100 mg Per G Tube BID Dorisann Frames, MD   100 mg at 02/20/22 9604 Allergies:Patient has no known allergies.Family History:Danielle [name]'s family history is not on file.Social History:Danielle [name]  has no history on file for tobacco use, alcohol use, and drug use.Review of Systems: (Significantly Limited due to Patient Condition)  Constitutional: Denied fevers, chills, night sweatsSkin: Denied rashes, alopeciaEyes: Endorsed Dry Eyes, Denied pain, redness, visual blurring, diplopiaENT: Endorsed dry mouthCardiac: Denied chest painVascular: Denied Raynaud?s, venous stasisPulmonary: Denied shortness of breath, cough, hemoptysisGI: Denied abdominal pain, ulcersGU: Unable to AssessMusculoskeletal: Denied joint pain, joint swelling, muscle aching or tenderness, muscle weaknessNeurological: Unable to AssessHeme/Lymph: Unable to AssessPsychiatric: Unable to AssessVitals:Temp:  [97.3 ?F (36.3 ?C)-98.1 ?F (36.7 ?C)] 97.3 ?F (36.3 ?C)Pulse:  [89-98] 94Resp:  [16-18] 16BP: (100-115)/(67-78) 111/74SpO2:  [94 %-99 %] 99 %Device (Oxygen Therapy): room air on room airPhysical Examination:General: alert and oriented; in no acute distress. Pleasant on exam, though stares off and does not answer certain questions. HEENT: PERRL; EOMI, no scleral icterus; no conjunctival injections; Dry Mucus Membranes; no oral lesions or exudates. Poor dentition with multiple front teeth missing. Neck: supple; no thyromegaly.  Cardiac: normal rate; regular rhythm; no MRGs.Lungs: moving air well; lungs clear bilaterally.Abdomen: normal bowel sounds; soft; Mild tenderness of the RUQ on deep palpation; non-distended. Surgical scar present with healing granulation tissue and no erythema. PEG tube clean and in place; RLQ Ileostomy pouch with green liquid stool output; RUQ Mucous Fistula pouch Extremities: no lower extremity edema.Skin: warm and dry; no rashes or lesions. 2 ecchymoses present on the left forearm. Neuro: CN 2-12 Grossly intact; A&Ox1-2 (Disoriented to Month and Place), Face Symmetric. Confused Speech. MSK: RUE: No TTP of hand, wrist, or elbow. No joint swelling. No erythema. Ulnar Deviation of the hand noted. 5/5 Strength when prompted. LUE: No TTP of knee, ankle, or foot. No joint swelling or effusions. No Erythema. RLE: No TTP of hand, wrist, or elbow. No joint swelling. No erythema. Ulnar Deviation of the hand noted. 5/5 strength when prompted. LLE: No TTP of knee, ankle, or foot. No joint swelling or effusions. No Erythema. Labs: Recent Labs Lab 02/27/230457 03/01/230359 03/02/230924 WBC 6.2 6.8 6.0 HGB 8.7* 8.9* 10.3* HCT 29.30* 30.40* 35.20 PLT 259 274 307  Recent Labs Lab 02/25/230531 03/01/230359 03/02/230924 NEUTROPHILS 57.0 49.5 64.1  Recent Labs Lab 02/28/230556 02/28/230851 03/01/230359 03/01/230839 03/02/230924 NA 144  --  146*  --  145* K 3.7  --  4.0  --  3.9 CL 114*  --  114*  --  113* CO2 23  --  25  --  24 BUN 15  --  19  --  12 CREATININE 0.22*  --  0.28*  --  0.28* GLU 94   < > 113*   < > 119* ANIONGAP 7  --  7  --  8  < > =  values in this interval not displayed.  Recent Labs Lab 02/28/230556 03/01/230359 03/02/230924 CALCIUM 9.8 9.8 10.8* MG 1.9 2.0  --  PHOS  --  2.2  --   Recent Labs Lab 02/28/230556 03/01/230359 03/02/230924 ALBUMIN 2.2* - 2.4* 2.4* 2.6* AST 57* 37* 32 ALT 77* 58* 43* ALKPHOS 141* 140* 132* BILITOT 0.2 0.2 0.3  Recent Labs Lab 02/24/230509 02/25/230531 02/26/230455 02/28/230556 PTT 57.1* 49.8*  --  26.8 LABPROT  --  11.9 12.0 11.4 INR  --  1.15 1.16 1.10  Microbiology:Recent Results (from the past 336 hour(s)) Urine microscopic     ([facility])  Collection Time: 02/12/22  6:44 AM Result Value Ref Range  Manual Microscopic Performed   RBC/HPF 6-10 (A) 0 - 2 /HPF  WBC/HPF 3-5 0 - 5 /HPF  Bacteria, UA Few (A) None-Rare /HPF  Trans Epithelial Cells, UA Rare (A) None /HPF  Hyaline Casts, UA 1-3 0 - 3 /LPF  Granular Casts, UA 4-10 (A) None /LPF  Waxy Casts, UA 4-10 (A) None /LPF  RBC/HPF, UA    WBC/HPF, UA   - Blood Cx (02/12/2022): No growth after 5 days of incubation- MRSA Culture of Nares (02/12/2022): Positive- CSF Culture (02/18/2022): No Growth to DateRadiology:CXR (portable)Result Date: 02/11/2022 No acute cardiothoracic abnormality. Report Initiated By:  Tessie Eke, RRA Reported And Signed By: Alberteen Sam, MD  [facility] Radiology and Biomedical Imaging [place] wo IV ContrastResult Date: 2/22/2023Since the prior exam, there has been a significant increase in ventricular size and sylvian fissures. In the appropriate clinical setting normal pressure hydrocephalus can be considered. Report Initiated By:  Consuela Mimes, MD Reported And Signed By: Cherre Blanc, MD  [facility] Radiology and Biomedical Imaging [place] Chest wo IV ContrastResult Date: 2/22/2023Patchy groundglass opacities in bilateral lower lobes with 4 mm right lower lobe lung nodule, findings may represent infectious/inflammatory etiology in a proper clinical setting. Short-term follow-up can be obtained for resolution. Otherwise consider follow-up chest [place] in 12 months for stability. Reported And Signed By: Antonieta Loveless, MD  [facility] Radiology and Biomedical Imaging [place] Abdomen Pelvis w IV ContrastResult Date: 2/22/2023No collection in abdomen or pelvis. 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 Correlation with prior imaging is recommended. Reported And Signed By: Madie Reno, MD  [facility] Radiology and Biomedical Imaging US Abdomen Pelvis Vascular Study Complete [facility] LM)Result Date: 02/18/2022 No evidence of portal vein or hepatic vein thrombosis. Increased liver echogenicity, which is most commonly noted with hepatic steatosis. However, please note that more advanced forms of liver disease, including hepatic fibrosis/early cirrhosis may have a similar imaging appearance. [place] [facility] Notify System Classification: Routine Report Initiated By:  Kreg Shropshire, MD Reported And Signed By: Verl Bangs, MD  [facility] Radiology and Biomedical Imaging Assessment and Plan: Danielle [name] is a 66 y.o. 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 Rheumatology was consulted for elevated ANA, IgG levels, and IFE. Primary medical team was concerned for possibly attribution of cryoglobulinemia in the setting of the patient's AMS. The patient was also noted to have elevated CRP and ESR labs, though this may be elevated due to recent infections.Cryoglobulinemia can sometimes be attributable in settings of ANA and IgG levels, however it  is less likely given normal C3 and C4 levels (generally C4 is low in these cases). Given carcinomatosis noted on abdominal imaging, it is possible that cancer could be a contributor if true cryoglobulinemia exists in this patient. Additionally, a chronic hepatitis C infection could have caused this, however Hepatitis C RT-PCR was negative.  Cryoglobulins labs are still pending, so we will follow-up. While RF is also pending, this will be less helpful given the patient's known RA. Irregardless of the presence of cryoglobulins, it is less likely that a flare of rheumatoid arthritis or cryoglobulinemia is contributing to this patient's persistent encephalopathy.Though the patient's ANA is elevated, ANA and smooth muscle antibody can be elevated in the setting of hepatic steatosis, which was noted on recent ultrasound of the liver. Other causes of this persistent encephalopathy should be continued to be explored and would recommend further testing as noted below. We note the marked hypercalcemia and we agree with oncology that this needs further investigation and treatment by primary medicine team. As this hypercalcemia could denote a malignant process, we agree with oncology that this may be a more malignant process, especially given peritoneal nodes. Additionally, we do think that further review of previous MRI brain imaging will be valuable, including ruling out disease processes such as Cerebral Amyloid Angiopathy (CAA) as a contributor to this patient's AMS. Recommendations:- Consider ammonia, HIV, Syphilis, and Anti-TPO, T3, T4 testing- Follow-up on RF, Cryoglobulins, dsDNA, ENA, Anti-CCP- Awaiting records from rheumatologist, Dr. Zenovia Jordan, as requested by primary medicine team- Recommend discussion with neurology to see if there is any additional imaging recommended for brain or spinal lesions given possibly carcinomatosis on abdominal imaging - previous outside hospital MRI brain and spine had been done without contrast- Recommend reviewing with radiology previous MRI brain imaging for signs of Cerebral Amyloid Angiopathy (CAA) including non-hemorrhagic findings such as acute ischemic micro-infarcts and white matter hyperintensities. - Appreciate input from Oncology and IR- Agree with further work-up of Hypercalcemia. Will defer treatment to primary team. - Agree with biopsies of liver and peritoneal node.- From rheumatology's prospective, would hold methotrexate in light of liver injury- No indication for additional immunosuppressive therapy at this time while patient is undergoing multidisciplinary evaluationPatient was seen and discussed with the attending Dr. Rudi Coco you for the interesting consult.Rheumatology team will continue to follow peripherally. Electronically Signed:Dr. Roylene Reason, DO PGY-2, Internal MedicineDate: 3/2/2023Time: 11:32 AMPager # [id number] was seen and discussed with resident Dr. Tresa Endo and attending Dr. Danice Goltz.  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  In regards to positive ANA, the patient was noted to have elevated liver enzymes, positive smooth muscle antibodies, and elevated IgG, which could tie into a unifying diagnosis of autoimmune hepatitis, a condition that is associated with rheumatoid arthritis.  She is pending a liver biopsy, which would clarify this picture.  ANA can also be positive in rheumatoid arthritis patients, though typically in a homogeneous pattern; a speckled pattern may suggest a connective tissue disease overlap, for example, a not uncommon secondary Sjogren's syndrome, given the patient's report of dry eyes and dry mouth.  Exposure to TNF inhibitors may induce ANA production, though positive ANA alone does not equate to clinical disease such as drug-induced lupus.  Treatment history for this patient is uncertain, pending records from her rheumatologist Dr. Nickola Major.  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  However, the patient clinically does not exhibit the findings characteristic of cryoglobulinemia, such as purpura, acute kidney injury, active arthritis, etc. Normal C3 and C4 point away from cryoglobulinemia; the patient is pending rheumatoid factor, which may be positive due to her history of rheumatoid arthritis instead.  Cryoglobulin test is in process, which would clarify the picture.How this all fits into the patient's encephalopathy is less certain.  Rheumatoid arthritis and connective tissue disease may cause encephalopathy in the setting of associated CNS vasculitis, but would typically expect other characteristic findings, such as severe articular disease, that would be suggestive of overt disease activity.  Cryoglobulinemia typically affects the peripheral rather than central nervous system.  The patient's history of rheumatoid arthritis and now abnormal monoclonal gammopathy workup raises the question of possible amyloidosis, particularly cerebral amyloid angiopathy, which can be further investigated by review of her existing images.  Her encephalopathy and generalized weakness are likely multifactorial, and further possible venues for exploration are as listed above.  From the rheumatological perspective, will hold on further immunosuppressive at this time given no overt disease activity, liver injury, and ongoing multidisciplinary investigation.Diamantina Monks, MDRheumatology Fellow

## 2022-02-21 NOTE — Plan of Care
Inpatient Physical Therapy Progress Note IP Adult PT Eval/Treat - 02/21/22 0910    Date of Visit / Treatment  Date of Visit / Treatment 02/21/22   Progress Report Due 02/26/22   End Time 0910    General Information  Subjective Mostly nonverbal, agreeable with encouragement   General Observations Supine in bed, HOB elevated, RA, NAD. Cleared by RN for PT session.   Precautions/Limitations Fall Precautions;Bed alarm    Vital Signs and Orthostatic Vital Signs  Vital Signs Vital Signs Stable   Vital Signs Free text per chart    Pain/Comfort  Pain Comment (Pre/Post Treatment Pain) Pain noted in BLE with movement. Positioned for comfort, RN aware.    Patient Coping  Diversional Activities television    Cognition  Overall Cognitive Status Impaired   Orientation Level Oriented to person   Level of Consciousness lethargic   Following Commands Follows one step commands with increased time;Follows one step commands with repetition   Personal Safety / Judgment Fall risk    Musculoskeletal  LUE Muscle Strength Grading 2-->active movement with gravity eliminated   RUE Muscle Strength Grading 2-->active movement with gravity eliminated   Musculoskeletal Comments UTA BLEs    Skin Assessment  Skin Assessment See Nursing Documentation    Balance  Balance Skills Training Comment Bed level only 2/2 pain/discomfort with BLEs    Bed Mobility  Bed Mobility Comments Pt deferred, only agreeable to bed level PROM    Handoff Documentation  Handoff Patient in bed;Bed alarm;Patient instructed to call nursing for mobility;Discussed with nursing    Endurance  Endurance Comments Poor    PT- AM-PAC - Basic Mobility Screen- How much help from another person do you currently need.....  Turning from your back to your side while in a a flat bed without using rails? 1 - Total - Requires total assistance or cannot do it at all.   Moving from lying on your back to sitting on the side of a flat bed without using bed rails? 1 - Total - Requires total assistance or cannot do it at all.   Moving to and from a bed to a chair (including a wheelchair)? 1 - Total - Requires total assistance or cannot do it at all.   Standing up from a chair using your arms(e.g., wheelchair or bedside chair)? 1 - Total - Requires total assistance or cannot do it at all.   To walk in a hospital room? 1 - Total - Requires total assistance or cannot do it at all.   Climbing 3-5 steps with a railing? 1 - Total - Requires total assistance or cannot do it at all.   AMPAC Mobility Score 6   TARGET Highest Level of Mobility Mobility Level 2, Turn self in bed/bed activity/dependent transfer    ACTUAL Highest Level of Mobility Mobility Level 2, Turn self in bed/bed activity/ dependent transfer    Therapeutic Exercise  Therapeutic Exercise Comments BUE/ BLE PROM to tolerance    Therapeutic Functional Activity  Therapeutic Functional Activity Comments Repositioned for comfort with pillows    Clinical Impression  Follow up Assessment Pt seen for PT follow up session. With encouragement Pt agreeable to bed level therex. Limited by BLE pain with movement. Pt will benefit from continued skilled PT to improve functional strength and mobility.    Frequency/Equipment Recommendations  PT Frequency 3x per week   What day of week is next treatment expected? Monday   3/6  PT/PTA completing this assessment Patsey Berthold  PT Recommendations for Inpatient Admission  Activity/Level of Assist mechanical lift    Planned Treatment / Interventions  Plan for Next Visit Progress as tolerated   Education Treatment / Interventions Patient Education / Training    PT Discharge Summary  Physical Therapy Disposition Recommendation Short Term Rehab     Lemmie Evens, [place]: [phone]

## 2022-02-21 NOTE — Progress Notes
[facility] Medicine Progress NotePatient: Danielle [name], a 66 y.o. femaleAttending Provider: Truman Hayward, MDAdmission Date: 2/21/2023Hospital Day: 10Subjective: Interim History: Patient was seen and examined this morning, no acute issues overnight. Stable on room air and afebrile. Review of Hx/Allergies/Meds: I have reviewed the patient's: current scheduled medications and current prn medicationsPMH: No past medical history on file. PSH: No past surgical history on file. Allergies: has No Known Allergies. Current Medications:Scheduled: Current Facility-Administered Medications Medication Dose Route Frequency Provider Last Rate Last Admin ? [Held by provider] apixaban (ELIQUIS) tablet 5 mg  5 mg Per G Tube Q12H Littie Deeds, MD     ? chlorhexidine gluconate (PERIDEX) 0.12 % solution 15 mL  15 mL Mouth/Throat BID Dorisann Frames, MD   15 mL at 02/20/22 2216 ? Elemental iron 60 mg (300 mg Ferrous sulfate) / 5 mL oral liquid 45 mg  45 mg Per G Tube BID Dorisann Frames, MD   45 mg at 02/20/22 2216 ? enoxaparin (LOVENOX) injection 80 mg  80 mg Subcutaneous Q12H Larkin Alfred, MD   80 mg at 02/20/22 2216 ? famotidine (PEPCID) suspension (8 mg/mL) 20 mg  20 mg Per G Tube Daily Dorisann Frames, MD   20 mg at 02/20/22 1610 ? folic acid (FOLVITE) tablet 1 mg  1 mg Per G Tube Daily Dorisann Frames, MD   1 mg at 02/20/22 9604 ? niacin Immediate Release tablet 100 mg  100 mg Per G Tube Nightly Dorisann Frames, MD   100 mg at 02/20/22 2217 ? pyridoxine (vitamin B6) (B-6) tablet 100 mg  100 mg Per G Tube Daily Dorisann Frames, MD   100 mg at 02/20/22 5409 ? [Held by provider] rosuvastatin (CRESTOR) tablet 10 mg  10 mg Per G Tube Nightly Dorisann Frames, MD   10 mg at 02/14/22 2245 ? sodium chloride 0.9 % (new bag) bolus 1,000 mL  1,000 mL Intravenous Once Fowler Antos, MD     ? sodium chloride 0.9 % flush 3 mL  3 mL IV Push Q8H Dorisann Frames, MD   3 mL at 02/18/22 2204 ? thiamine (VITAMIN B1) tablet 100 mg  100 mg Per G Tube BID Dorisann Frames, MD   100 mg at 02/20/22 2216  Continuous Infusions:  PRN: acetaminophen, dextrose (GLUCOSE) oral liquid 15 g **OR** fruit juice **OR** skim milk, dextrose (GLUCOSE) oral liquid 30 g **OR** fruit juice, dextrose injection, dextrose injection, glucagon, sodium chloride Objective: I have reviewed the patient's current vital signs as documented in the patient's EMR.Vitals (Last 24 Hrs):Temp:  [97.3 ?F (36.3 ?C)-98.7 ?F (37.1 ?C)] 98.7 ?F (37.1 ?C)Pulse:  [87-103] 87Resp:  [16-18] 18BP: (104-118)/(68-76) 110/73SpO2:  [98 %-100 %] 100 %Device (Oxygen Therapy): room air on RAWt Readings from Last 3 Encounters: 02/14/22 79.4 kg 12/13/18 98.6 kg  I/O's (Last 24 Hrs):Gross Totals (Last 24 hours) at 02/21/2022 0731Last data filed at 02/21/2022 0700Intake 1811 ml Output 355 ml Net 1456 ml  Procedures:None Physical Exam: General Impression: Alert and oriented x1, not in acute distressHEENT: Normocephalic atraumatic, extra-ocular movements intactCardiovascular: Heart regular rate and rhythm, S1&S2 audibleChest: Lungs clear to auscultation bilaterally, no rhonchi, no wheezeAbdomen: Bowel sounds present, abdomen soft, non-tenderMusculoskeletal: Mild LE edemaNeurological: Answers simple questions, follows commands, moves both upper extremities spontaneously but not lower extremities except that she feels the pain when ellicitedLabs: CBCRecent Labs Lab 02/27/230457 03/01/230359 03/02/230924 WBC 6.2 6.8 6.0 HGB 8.7* 8.9*  10.3* HCT 29.30* 30.40* 35.20 MCV 88.0 88.9 89.6 PLT 259 274 307 BMPRecent Labs Lab 02/28/230556 03/01/230359 03/02/230924 NA 144 146* 145* K 3.7 4.0 3.9 CL 114* 114* 113* CO2 23 25 24  ANIONGAP 7 7 8  BUN 15 19 12  CREATININE 0.22* 0.28* 0.28* CALCIUM 9.8 9.8 10.8* MG 1.9 2.0  --  PHOS  --  2.2  --   LFTsRecent Labs Lab 02/25/230531 02/26/230455 02/28/230556 03/01/230359 03/02/230924 ALT 147*   < > 77* 58* 43* AST 135*   < > 57* 37* 32 ALKPHOS 124*   < > 141* 140* 132* BILITOT 0.3   < > 0.2 0.2 0.3 BILIDIR <0.2  --   --   --   --  ALBUMIN 2.1*   < > 2.2* - 2.4* 2.4* 2.6*  < > = values in this interval not displayed. CoagsRecent Labs Lab 02/28/230556 INR 1.10 PTT 26.8  GlucoseRecent Labs Lab 03/01/232131 03/02/230846 03/02/230924 03/02/231246 03/02/231741 03/02/232120 GLU 101* 89 119* 109* 127* 96  A1CNo results found for: HGBA1C Diagnostics: Imaging:No new radiology.Echo:No results found for this or any previous visit.ECG/Tele Events: No ECG ordered todayAssessment: 66 y.o. female PMHx RA on MTX (off since November), recent prolonged hospitalization in [place] for perforated diverticulitis s/p ex lap/R hemicolectomy/end ileostomy, persistent encephalopathy with multiple stable MRIs, surgical incision abscess s/p drainage, G-tube placement, DVT on AC a/w persistent encephalopathy, fevers and leukocytosis. ID consulted to help with source of infection, improving on IV abx.  Course c/b G-tube leaking, hypernatremia, transaminitis. She has ongoing encephalopathy without significant improvement, work up still ongoing. Plan: # Acute encephalopathy- Etiology so far unclear- s/p LP, initial results unremarkable, will follow up pending tests. Will need to r/o autoimmune process vs malignancy- MRI total spine with contrast ordered. 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, now improving- RUQ [place] with doplex pending today- Now found to have elevated IgG and smooth muscle antibodies which is concerning for possible autoimmune hepatitis - Liver biopsy scheduled on Monday 3/6, will confirm with IR that it needs to be transjugular- Cont to trend daily LFTs, no clear evidence of live dysfunction so far# Elevated FLC ratio# Iron deficiency anemia- Pending final UPEP and urine IFE- Peripheral smear- Copper and zinc levels pending- Will give IV iron once epidural infection is ruled out- Seen by hematology, elevated FLC ratio felt to be reactive and plasma cell disorder unlikely# Multiple peritoneal nodules- 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 Zenovia Jordan- Rheumatology following, current AMS and weakness felt to be unlikely related to her RA# Bilateral LE DVT- Diagnosed during recent hospitalization in [place]. Seen on [place] from 01/22/2022. Possibly provoked in the setting of recent surgery but also now with concerns for malignancy- Cont lovenox# Fevers, leukocytosis- Now resolved, no clear source identified. Wonder if relate to DVT vs malignancy vs autoimmune? - Treated with 5 days of zosyn per IDDVT ppx: LovenoxDiet: Tube feedsElectronically Signed:Olivia Pavelko, MDHospitalist Attending PhysicianYale [facility] [address]New [address]i, [place] [address]/3/202311:47 AMDISCLAIMER: This chart was created using M-Modal dictation software. Efforts  were made by me to ensure accuracy, however some errors may be present due to limitations of this technology and occasionally words are not transcribed as intended.

## 2022-02-21 NOTE — Plan of Care
Plan of Care Overview/ Patient Status    A/Ox2, unsure of date/time, and situation. VSS, RA. Pt labs sent for results, see Results. Pt has sever BLE pain when elevated or moved, seen and assessed by neuro, providers and additional specialists. Please see Notes and Orders for assessments and subsequent orders. Pt continued on Qid TF w/ flushes and meds crushed through Gtube, tolerated well. Flushes DC'd and ordered NS bolus. Pt incontinent x2. Attempted to re-apply Purewick for 24hr urine specimen, however, pt inadvertently pulls appliance out and adjusts self in bed; unstable for external catheter placement. Mouth care provided x2, tolerates well. Pt refused dressing changes for areas on BLE, stating she was in too much pain. Endorsed to HS RN to encourage pt to allow dressing changes. Problem: Adult Inpatient Plan of CareGoal: Plan of Care ReviewOutcome: Interventions implemented as appropriate

## 2022-02-21 NOTE — Plan of Care
Plan of Care Overview/ Patient Status    Problem: Adult Inpatient Plan of CareGoal: Readiness for Transition of CareOutcome: Interventions implemented as appropriate CM received a call from [name] (sister) to provide an update on her progress in obtaining necessary information from pt's landlord & physician in [place]. Pt's mail has been collecting at her old home in [place], [name] has requested all mail to be sent to her, she believes some of the requested information from pt's physician's may be in the mail at pt's [place] address. [name] became tearful when discussing everything that she is working on to obtain conservatorship and T-19 for the pt. CM reassured her that she is doing a good job and doing everything she can to ensure the pt is well cared for. She was comforted knowing the pt is here and feels she is in good hands. SW updated on [name]'s efforts and she will offer support as needed. Elinor Parkinson, RN, MSNCare ManagerMHB: [phone]

## 2022-02-22 LAB — COMPREHENSIVE METABOLIC PANEL
BKR A/G RATIO: 0.5 — ABNORMAL LOW (ref 1.0–2.2)
BKR ALANINE AMINOTRANSFERASE (ALT): 30 U/L (ref 10–35)
BKR ALBUMIN: 2.7 g/dL — ABNORMAL LOW (ref 3.6–4.9)
BKR ALKALINE PHOSPHATASE: 115 U/L (ref 9–122)
BKR ANION GAP: 8 (ref 7–17)
BKR ASPARTATE AMINOTRANSFERASE (AST): 33 U/L (ref 10–35)
BKR AST/ALT RATIO: 1.1
BKR BILIRUBIN TOTAL: 0.4 mg/dL (ref <=1.2)
BKR BLOOD UREA NITROGEN: 10 mg/dL (ref 8–23)
BKR BUN / CREAT RATIO: 45.5 — ABNORMAL HIGH (ref 8.0–23.0)
BKR CALCIUM: 10.5 mg/dL — ABNORMAL HIGH (ref 8.8–10.2)
BKR CHLORIDE: 114 mmol/L — ABNORMAL HIGH (ref 98–107)
BKR CO2: 24 mmol/L (ref 20–30)
BKR CREATININE: 0.22 mg/dL — ABNORMAL LOW (ref 0.40–1.30)
BKR EGFR, CREATININE (CKD-EPI 2021): >60 mL/min/{1.73_m2} (ref >=60)
BKR GLOBULIN: 5.4 g/dL — ABNORMAL HIGH (ref 2.3–3.5)
BKR GLUCOSE: 93 mg/dL (ref 70–100)
BKR POTASSIUM: 3.9 mmol/L (ref 3.3–5.3)
BKR PROTEIN TOTAL: 8.1 g/dL (ref 6.6–8.7)
BKR SODIUM: 146 mmol/L — ABNORMAL HIGH (ref 136–144)

## 2022-02-22 LAB — QUANTIFERON-TB
BKR QUANTIFERON-TB GOLD IN-TUBE: NEGATIVE
BKR QUANTIFERON-TB MITOGEN MINUS NIL: 3.79 [IU]/mL
BKR QUANTIFERON-TB NIL: 0.01 [IU]/mL
BKR QUANTIFERON-TB1 MINUS NIL: 0.01 [IU]/mL (ref <0.35)
BKR QUANTIFERON-TB2 MINUS NIL: 0.01 [IU]/mL (ref <0.35)

## 2022-02-22 LAB — ZZZBLOOD SMEAR MD INTERPRETATION     (YH)

## 2022-02-22 NOTE — Plan of Care
Plan of Care Overview/ Patient Status    A/Ox2, VSS, RA. Incontinent x2, non-ambulatory. Pt had 2 occurrences of urine; changed linens, gown and pad. Pt refused morning labs again this am, obtained by management and sent to lab. Pt continued on TF QID w/ crushed meds through tube and w/ free water flushes Q6hrs; tolerated well. Changed wound dressings, sent new pic to provider. Needs to wound care consult to re-evaluated dressing changes as wounds are healing. Received new order for continuous NS @150mL /hr. Pt safety maintained, fall bundle in place.Problem: Adult Inpatient Plan of CareGoal: Plan of Care ReviewOutcome: Interventions implemented as appropriate

## 2022-02-22 NOTE — Plan of Care
Plan of Care Overview/ Patient Status    A&Ox1-2, patient appears more withdrawn than days prior and is less interactive with staff. Red drainage observed on peripad - drainage is thick and appears to be separate from urine as urine remains yellow. Difficult to determine where drainage is coming from due to limited mobility and pain patient experiences when attempting to turn. When inquiring about pain patient states it is BLE and only with movement. Patient states she does not want pain medicine at this time, nursing provided analgesic education. Resting with bed in lowest position, call light within reach, and fall bundle in place.

## 2022-02-22 NOTE — Plan of Care
Plan of Care Overview/ Patient Status    0700-1900Pt is A&Ox1-2. VS-tachycardiac (95-102), soft BP 104 was hypertensive in the AM. No pain unless moving BLE and back. No tele/SOB/n/v. Takes pill via PEG. Tube feed per MAR, flushes increased to q4hrs. Incontinent of urine, ileostomy bag changed per MAR. Safety maintained with bed in lowest position and hourly rounding. Problem: Adult Inpatient Plan of CareGoal: Plan of Care ReviewOutcome: Interventions implemented as appropriateGoal: Patient-Specific Goal (Individualized)Outcome: Interventions implemented as appropriateGoal: Absence of Hospital-Acquired Illness or InjuryOutcome: Interventions implemented as appropriateGoal: Optimal Comfort and WellbeingOutcome: Interventions implemented as appropriate

## 2022-02-22 NOTE — Progress Notes
[facility] Digestive Diseases Gastroenterology Progress NoteAttending Provider: Truman Hayward, MD [phone] History: - Less interactive today. Did not respond to multiple questions, but mumbled- Evaluated by rheumatology, heme/onc.  Primary team was able to obtain outside imaginesObjective: Vitals:Temp:  [98 ?F (36.7 ?C)-98.7 ?F (37.1 ?C)] 98.7 ?F (37.1 ?C)Pulse:  [87-94] 87Resp:  [18] 18BP: (104-110)/(68-73) 110/73SpO2:  [98 %-100 %] 100 %Device (Oxygen Therapy): room airPhysical Exam:GENERAL: awake, but difficult to understand. Does not respond to a lot of commandsENT: Moist mucous membranes, poor teethCARDIOVASCULAR: Regular rate and rhythm, no murmursRESPIRATORY: No respiratory distress on RAGASTROINTESTINAL: G tube in place. Nontender to palpationSKIN: Warm and dry. No edemaNEURO: Moves all four extremities?Medications:SCHEDULED:Current Facility-Administered Medications Medication Dose Route Frequency Provider Last Rate Last Admin ? [Held by provider] apixaban (ELIQUIS) tablet 5 mg  5 mg Per G Tube Q12H Littie Deeds, MD     ? chlorhexidine gluconate (PERIDEX) 0.12 % solution 15 mL  15 mL Mouth/Throat BID Dorisann Frames, MD   15 mL at 02/21/22 0944 ? Elemental iron 60 mg (300 mg Ferrous sulfate) / 5 mL oral liquid 45 mg  45 mg Per G Tube BID Dorisann Frames, MD   45 mg at 02/21/22 0946 ? enoxaparin (LOVENOX) injection 80 mg  80 mg Subcutaneous Q12H Chaar, Abdelkader, MD   80 mg at 02/21/22 0944 ? famotidine (PEPCID) suspension (8 mg/mL) 20 mg  20 mg Per G Tube Daily Dorisann Frames, MD   20 mg at 02/21/22 0944 ? folic acid (FOLVITE) tablet 1 mg  1 mg Per G Tube Daily Dorisann Frames, MD   1 mg at 02/21/22 0945 ? niacin Immediate Release tablet 100 mg  100 mg Per G Tube Nightly Dorisann Frames, MD   100 mg at 02/20/22 2217 ? pyridoxine (vitamin B6) (B-6) tablet 100 mg  100 mg Per G Tube Daily Dorisann Frames, MD   100 mg at 02/21/22 0946 ? [Held by provider] rosuvastatin (CRESTOR) tablet 10 mg  10 mg Per G Tube Nightly Dorisann Frames, MD   10 mg at 02/14/22 2245 ? sodium chloride 0.9 % (new bag) bolus 1,000 mL  1,000 mL Intravenous Once Chaar, Abdelkader, MD     ? sodium chloride 0.9 % flush 3 mL  3 mL IV Push Q8H Dorisann Frames, MD   3 mL at 02/18/22 2204 ? thiamine (VITAMIN B1) tablet 100 mg  100 mg Per G Tube BID Dorisann Frames, MD   100 mg at 02/21/22 0944 YNW:[id number], dextrose (GLUCOSE) oral liquid 15 g **OR** fruit juice **OR** skim milk, dextrose (GLUCOSE) oral liquid 30 g **OR** fruit juice, dextrose injection, dextrose injection, glucagon, sodium chlorideLabs:Recent Labs Lab 02/27/230457 03/01/230359 03/02/230924 WBC 6.2 6.8 6.0 HGB 8.7* 8.9* 10.3* HCT 29.30* 30.40* 35.20 PLT 259 274 307  Recent Labs Lab 02/25/230531 03/01/230359 03/02/230924 NEUTROPHILS 57.0 49.5 64.1  Recent Labs Lab 03/01/230359 03/01/230839 03/02/230924 03/02/231246 03/03/231144 03/03/231306 NA 146*  --  145*  --  145*  --  K 4.0  --  3.9  --  3.5  --  CL 114*  --  113*  --  113*  --  CO2 25  --  24  --  25  --  BUN 19  --  12  --  11  --  CREATININE 0.28*  --  0.28*  --  0.24*  --  GLU 113*   < > 119*   < >  141* 103* ANIONGAP 7  --  8  --  7  --   < > = values in this interval not displayed.  Recent Labs Lab 03/01/230359 03/02/230924 03/03/231144 CALCIUM 9.8 10.8* 10.3* MG 2.0  --   --  PHOS 2.2  --   --   Recent Labs Lab 02/25/230531 02/26/230455 03/01/230359 03/02/230924 03/03/231144 ALT 147*   < > 58* 43* 34 AST 135*   < > 37* 32 23 ALKPHOS 124*   < > 140* 132* 113 BILITOT 0.3   < > 0.2 0.3 0.2 BILIDIR <0.2  --   --   --   --  PROT 7.1   < > 7.4 8.1 7.6 ALBUMIN 2.1*   < > 2.4* 2.6* 2.5*  < > = values in this interval not displayed.  Recent Labs Lab 02/25/230531 02/26/230455 02/28/230556 PTT 49.8*  --  26.8 LABPROT 11.9 12.0 11.4 INR 1.15 1.16 1.10  No results found for: IRON, TIBC, FERRITIN Imaging:[place] Abdomen Pelvis w contrast (02/12/2022):Lung bases: Dependent atelectasis and patchy groundglass changes are seen.Hepatobiliary: Unremarkable.Pancreas: Unremarkable.Spleen: Unremarkable.Adrenal glands: Unremarkable.Kidneys: Simple appearing left renal cyst.Bowel: Percutaneous gastrostomy in situ. 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 Oral contrast extends into right lower quadrant enterostomy. Extensive diverticulosis in the large bowel. No definite findings of acute diverticulitis.Abdominal and pelvic lymph nodes: No lymphadenopathy.Peritoneum: Multiple peritoneal nodules are seen, most conspicuous adjacent to the gallbladder and along right lateral conal fascia.Vasculature: No abdominal aortic aneurysm. Pelvis: Calcified uterine fibroids. Hypodense thickening of endometrium, measuring 1.4 cmMusculoskeletal system and soft tissue: No aggressive osseous lesion.IMPRESSION:No collection in abdomen or pelvis. 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 Correlation with prior imaging is recommended.?Procedural:ReviewedOImpression & Recommendations Lamija Broerman is a 66 y.o. female with history of rheumatoid arthritis, previous prolonged hostapilization for perforated diverticulitis and large volume pneumonperitoneun s/p ex lap and R hemicolectomy/ileostomy in November who presents to [facility] for LTAC placement. Hepatology was consulted for abnormal LFTs.?Noted one day of LFT abnormality with AST/ALT 189/200s. 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 Overall, likely ceftriaxone (for which patient received one dose) vs zosyn. Prior measurements of LFTs at OSH seen in Care Everywhere largely have been normal, so do not suspect chornic liver disease. However, on initial review of laboratory studies, noted that IgG and ASMA elevated. Fortunately, LFT have continued to downtrend so do not feel pressed for steroid therapy at this time.  We recommend liver biopsy (concomitant with the biopsy for potential carcinomatosis), but we recommend transjugular approach.- recommend transjugular liver biopsy with IR instead of US guided- Continue to trend LFTs, INR daily- Appreciate excellent care and coordination by primary teamWe will continue to follow.Case discussed with Dr. Cheral Marker, final attending attestation to follow. Please do not hesitate to contact our team with any questions or concernsS. [name], MDYale Digestive DiseasesBest Contact: [employer] [phone] after 5pm and weekends, please contact on-call GI fellow as listed on AmionI saw and evaluated Ms. Vicencio on [employer] today.I agree with the evaluation/assessment/plan as outlined by Dr. [name].Plans in place for TJ liver biopsy.  Please contact us when completed for review of histology.

## 2022-02-22 NOTE — Progress Notes
[facility] Medicine Progress NotePatient: Danielle [name], a 66 y.o. femaleAttending Provider: Truman Hayward, MDAdmission Date: 2/21/2023Hospital Day: 11Subjective: Interim History: Patient was seen and examined this morning, no acute issues overnight. MRIs pending. Review of Hx/Allergies/Meds: I have reviewed the patient's: current scheduled medications and current prn medicationsPMH: No past medical history on file. PSH: No past surgical history on file. Allergies: has No Known Allergies. Current Medications:Scheduled: Current Facility-Administered Medications Medication Dose Route Frequency Provider Last Rate Last Admin ? [Held by provider] apixaban (ELIQUIS) tablet 5 mg  5 mg Per G Tube Q12H Littie Deeds, MD     ? chlorhexidine gluconate (PERIDEX) 0.12 % solution 15 mL  15 mL Mouth/Throat BID Dorisann Frames, MD   15 mL at 02/22/22 0957 ? Elemental iron 60 mg (300 mg Ferrous sulfate) / 5 mL oral liquid 45 mg  45 mg Per G Tube BID Dorisann Frames, MD   45 mg at 02/22/22 2956 ? enoxaparin (LOVENOX) injection 80 mg  80 mg Subcutaneous Q12H Anyi Fels, MD   80 mg at 02/22/22 0957 ? famotidine (PEPCID) suspension (8 mg/mL) 20 mg  20 mg Per G Tube Daily Dorisann Frames, MD   20 mg at 02/22/22 0957 ? folic acid (FOLVITE) tablet 1 mg  1 mg Per G Tube Daily Dorisann Frames, MD   1 mg at 02/22/22 2130 ? niacin Immediate Release tablet 100 mg  100 mg Per G Tube Nightly Dorisann Frames, MD   100 mg at 02/21/22 2121 ? pyridoxine (vitamin B6) (B-6) tablet 100 mg  100 mg Per G Tube Daily Dorisann Frames, MD   100 mg at 02/22/22 8657 ? [Held by provider] rosuvastatin (CRESTOR) tablet 10 mg  10 mg Per G Tube Nightly Dorisann Frames, MD   10 mg at 02/14/22 2245 ? sodium chloride 0.9 % (new bag) bolus 1,000 mL  1,000 mL Intravenous Once Marthella Osorno, MD ? sodium chloride 0.9 % flush 3 mL  3 mL IV Push Q8H Donnie Coffin, Sherlyn Lick, MD   3 mL at 02/21/22 1400 ? thiamine (VITAMIN B1) tablet 100 mg  100 mg Per G Tube BID Dorisann Frames, MD   100 mg at 02/22/22 0957  Continuous Infusions:  PRN: acetaminophen, dextrose (GLUCOSE) oral liquid 15 g **OR** fruit juice **OR** skim milk, dextrose (GLUCOSE) oral liquid 30 g **OR** fruit juice, dextrose injection, dextrose injection, glucagon, sodium chloride Objective: I have reviewed the patient's current vital signs as documented in the patient's EMR.Vitals (Last 24 Hrs):Temp:  [97.3 ?F (36.3 ?C)-98.2 ?F (36.8 ?C)] 97.3 ?F (36.3 ?C)Pulse:  [92-104] 92Resp:  [18-19] 18BP: (117-140)/(77-81) 140/77SpO2:  [98 %-100 %] 99 %Device (Oxygen Therapy): room air on RAWt Readings from Last 3 Encounters: 02/14/22 79.4 kg 12/13/18 98.6 kg  I/O's (Last 24 Hrs):Gross Totals (Last 24 hours) at 02/22/2022 1033Last data filed at 02/22/2022 0900Intake 2537 ml Output 250 ml Net 2287 ml  Procedures:None Physical Exam: General Impression: Alert and oriented x1, not in acute distressHEENT: Normocephalic atraumatic, extra-ocular movements intactCardiovascular: Heart regular rate and rhythm, S1&S2 audibleChest: Lungs clear to auscultation bilaterally, no rhonchi, no wheezeAbdomen: Bowel sounds present, abdomen soft, non-tenderMusculoskeletal: Mild LE edemaNeurological: Answers simple questions, follows commands, moves both upper extremities spontaneously but not lower extremities except that she feels the pain when ellicitedLabs: CBCRecent Labs Lab 02/27/230457 03/01/230359 03/02/230924 WBC 6.2 6.8 6.0 HGB 8.7* 8.9* 10.3* HCT 29.30* 30.40* 35.20 MCV 88.0 88.9  89.6 PLT 259 274 307 BMPRecent Labs Lab 02/28/230556 03/01/230359 03/02/230924 03/03/231144 NA 144 146* 145* 145* K 3.7 4.0 3.9 3.5 CL 114* 114* 113* 113* CO2 23 25 24 25  ANIONGAP 7 7 8 7  BUN 15 19 12 11  CREATININE 0.22* 0.28* 0.28* 0.24* CALCIUM 9.8 9.8 10.8* 10.3* MG 1.9 2.0  --   --  PHOS  --  2.2  --   --   LFTsRecent Labs Lab 03/01/230359 03/02/230924 03/03/231144 ALT 58* 43* 34 AST 37* 32 23 ALKPHOS 140* 132* 113 BILITOT 0.2 0.3 0.2 ALBUMIN 2.4* 2.6* 2.5* CoagsRecent Labs Lab 02/28/230556 INR 1.10 PTT 26.8  GlucoseRecent Labs Lab 03/03/230835 03/03/231144 03/03/231306 03/03/231726 03/03/232125 03/04/230903 GLU 96 141* 103* 119* 112* 104*  A1CNo results found for: HGBA1C Diagnostics: Imaging:No new radiology.Echo:No results found for this or any previous visit.ECG/Tele Events: No ECG ordered todayAssessment: 66 y.o. female PMHx RA on MTX (off since November), recent prolonged hospitalization in [place] for perforated diverticulitis s/p ex lap/R hemicolectomy/end ileostomy, persistent encephalopathy with multiple stable MRIs, surgical incision abscess s/p drainage, G-tube placement, DVT on AC a/w persistent encephalopathy, fevers and leukocytosis. ID consulted to help with source of infection, improving on IV abx.  Course c/b G-tube leaking, hypernatremia, transaminitis. She has ongoing encephalopathy without significant improvement, work up still ongoing. Plan: # Acute encephalopathy- Etiology so far unclear- s/p LP, initial results unremarkable, will follow up pending tests. Will need to r/o autoimmune process vs malignancy- MRI total spine with contrast ordered. 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, now improving- RUQ [place] with doplex pending today- Now found to have elevated IgG and smooth muscle antibodies which is concerning for possible autoimmune hepatitis - Liver biopsy scheduled on Monday 3/6, will confirm with IR that it needs to be transjugular- Cont to trend daily LFTs, no clear evidence of live dysfunction so far# Elevated FLC ratio# Iron deficiency anemia- Pending final UPEP and urine IFE- Peripheral smear- Copper and zinc levels pending- Will give IV iron once epidural infection is ruled out- Seen by hematology, elevated FLC ratio felt to be reactive and plasma cell disorder unlikely# Multiple peritoneal nodules- 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 Zenovia Jordan- Rheumatology following, current AMS and weakness felt to be unlikely related to her RA# Bilateral LE DVT- Diagnosed during recent hospitalization in [place]. Seen on [place] from 01/22/2022. Possibly provoked in the setting of recent surgery but also now with concerns for malignancy- Cont lovenox# Fevers, leukocytosis- Now resolved, no clear source identified. Wonder if relate to DVT vs malignancy vs autoimmune? - Treated with 5 days of zosyn per IDDVT ppx: LovenoxDiet: Tube feedsElectronically Signed:Hayven Croy, MDHospitalist Attending PhysicianYale Gastroenterology Associates Inc [address]New [place], [place] [address]/4/202310:34 AMDISCLAIMER: This chart was created using M-Modal dictation software. Efforts were made by me to ensure accuracy, however some errors may be present due to limitations of this technology and occasionally words are not transcribed as intended.

## 2022-02-23 ENCOUNTER — Inpatient Hospital Stay: Admit: 2022-02-23 | Payer: PRIVATE HEALTH INSURANCE

## 2022-02-23 DIAGNOSIS — R627 Adult failure to thrive: Secondary | ICD-10-CM

## 2022-02-23 LAB — EXTRACTABLE NUCLEAR ANTIGEN ABS GROUP     (BH LMW YH)
BKR RNP (U1RNP) AB: 1.6 {ELISA'U} (ref <5.0)
BKR SM AB: 1.8 {ELISA'U} (ref <7.0)
BKR SS-A AB: 186 {ELISA'U} — ABNORMAL HIGH (ref <7.0)
BKR SS-B AB: 1 {ELISA'U} (ref <7.0)

## 2022-02-23 LAB — CBC WITH AUTO DIFFERENTIAL
BKR WAM ABSOLUTE IMMATURE GRANULOCYTES.: 0.02 x 1000/ÂµL (ref 0.00–0.30)
BKR WAM ABSOLUTE LYMPHOCYTE COUNT.: 1.88 x 1000/ÂµL (ref 0.60–3.70)
BKR WAM ABSOLUTE NRBC (2 DEC): 0 x 1000/ÂµL (ref 0.00–1.00)
BKR WAM ANALYZER ANC: 4.86 x 1000/ÂµL (ref 2.00–7.60)
BKR WAM BASOPHIL ABSOLUTE COUNT.: 0.03 x 1000/ÂµL (ref 0.00–1.00)
BKR WAM BASOPHILS: 0.4 % (ref 0.0–1.4)
BKR WAM EOSINOPHIL ABSOLUTE COUNT.: 0.3 x 1000/ÂµL (ref 0.00–1.00)
BKR WAM EOSINOPHILS: 3.8 % (ref 0.0–5.0)
BKR WAM HEMATOCRIT (2 DEC): 29.8 % — ABNORMAL LOW (ref 35.00–45.00)
BKR WAM HEMOGLOBIN: 9 g/dL — ABNORMAL LOW (ref 11.7–15.5)
BKR WAM IMMATURE GRANULOCYTES: 0.3 % (ref 0.0–1.0)
BKR WAM LYMPHOCYTES: 24.1 % (ref 17.0–50.0)
BKR WAM MCH (PG): 26.5 pg — ABNORMAL LOW (ref 27.0–33.0)
BKR WAM MCHC: 30.2 g/dL — ABNORMAL LOW (ref 31.0–36.0)
BKR WAM MCV: 87.6 fL (ref 80.0–100.0)
BKR WAM MONOCYTE ABSOLUTE COUNT.: 0.71 x 1000/ÂµL (ref 0.00–1.00)
BKR WAM MONOCYTES: 9.1 % (ref 4.0–12.0)
BKR WAM MPV: 10.6 fL (ref 8.0–12.0)
BKR WAM NEUTROPHILS: 62.3 % (ref 39.0–72.0)
BKR WAM NUCLEATED RED BLOOD CELLS: 0 % (ref 0.0–1.0)
BKR WAM PLATELETS: 277 x1000/ÂµL (ref 150–420)
BKR WAM RDW-CV: 19.9 % — ABNORMAL HIGH (ref 11.0–15.0)
BKR WAM RED BLOOD CELL COUNT.: 3.4 M/ÂµL — ABNORMAL LOW (ref 4.00–6.00)
BKR WAM WHITE BLOOD CELL COUNT: 7.8 x1000/ÂµL (ref 4.0–11.0)

## 2022-02-23 LAB — ALBUMIN/TOTAL PROTEIN     (GH L LMW YH)
BKR A/G RATIO: 0.5 — ABNORMAL LOW (ref 1.0–2.2)
BKR ALBUMIN: 2.5 g/dL — ABNORMAL LOW (ref 3.6–4.9)
BKR GLOBULIN: 5.3 g/dL — ABNORMAL HIGH (ref 2.3–3.5)
BKR PROTEIN TOTAL: 7.8 g/dL (ref 6.6–8.7)

## 2022-02-23 LAB — BASIC METABOLIC PANEL
BKR ANION GAP: 9 (ref 7–17)
BKR BLOOD UREA NITROGEN: 14 mg/dL (ref 8–23)
BKR BUN / CREAT RATIO: 66.7 — ABNORMAL HIGH (ref 8.0–23.0)
BKR CALCIUM: 9.6 mg/dL (ref 8.8–10.2)
BKR CHLORIDE: 109 mmol/L — ABNORMAL HIGH (ref 98–107)
BKR CO2: 22 mmol/L (ref 20–30)
BKR CREATININE: 0.21 mg/dL — ABNORMAL LOW (ref 0.40–1.30)
BKR EGFR, CREATININE (CKD-EPI 2021): >60 mL/min/{1.73_m2} (ref >=60)
BKR GLUCOSE: 95 mg/dL (ref 70–100)
BKR POTASSIUM: 3.7 mmol/L (ref 3.3–5.3)
BKR SODIUM: 140 mmol/L (ref 136–144)

## 2022-02-23 LAB — CYCLIC CITRUL PEPTIDE ANTIBODY, IGG: BKR CCP AB, IGG: 35 {ELISA'U} — ABNORMAL HIGH (ref <7.0)

## 2022-02-23 LAB — CSF CULTURE     (BH GH LMW YH)
BKR CSF CULTURE GRAM STAIN: NONE SEEN
BKR CSF CULTURE: NO GROWTH

## 2022-02-23 LAB — COVID-19 CLEARANCE OR FOR PLACEMENT ONLY: BKR SARS-COV-2 RNA (COVID-19) (YH): NEGATIVE

## 2022-02-23 LAB — DSDNA ANTIBODY, IGG WITH IFA CONFIRMATION     (BH LMW YH): BKR DSDNA AB: 1.7 [IU]/mL (ref <10.0)

## 2022-02-23 MED ORDER — GADOTERATE MEGLUMINE 0.5 MMOL/ML (376.9 MG/ML) INTRAVENOUS SOLUTION
0.5 mmol/mL (376.9 mg/mL) | Freq: Once | INTRAVENOUS | Status: CP | PRN
Start: 2022-02-23 — End: ?
  Administered 2022-02-23: 17:00:00 0.5 mL via INTRAVENOUS

## 2022-02-23 NOTE — Consults
Please see note from Dr Freida Busman on 02/20/22

## 2022-02-23 NOTE — Plan of Care
Plan of Care Overview/ Patient Status    AOx 1-2. VSS, slightly tachy. No pain unless with moving. Incontinent. Ileostomy output charted. NG tube patent and tolerating feeding and flushes.  Problem: Adult Inpatient Plan of CareGoal: Plan of Care ReviewOutcome: Interventions implemented as appropriateGoal: Patient-Specific Goal (Individualized)Outcome: Interventions implemented as appropriateGoal: Absence of Hospital-Acquired Illness or InjuryOutcome: Interventions implemented as appropriateGoal: Optimal Comfort and WellbeingOutcome: Interventions implemented as appropriateGoal: Readiness for Transition of CareOutcome: Interventions implemented as appropriate Problem: InfectionGoal: Absence of Infection Signs and SymptomsOutcome: Interventions implemented as appropriate Problem: Physical Therapy GoalsGoal: Physical Therapy GoalsDescription: PT GOALS1. Patient will demonstrate active range of motion x 4 extremities in preparation for mobility2. Caregivers will be independent and safely implement a range of motion/positioning program to prevent loss of range of motion and skin integrity3. Patient will perform bed mobility with maximal assist4. Patient will tolerate sitting edge of bed >5 minutes with minimal assist5. Patient will perform transfers with moderate assist of 2 using rolling walker  Outcome: Interventions implemented as appropriate Problem: Occupational Therapy GoalsGoal: Occupational Therapy GoalsDescription: OT goals1. Pt will move to EOB for seated ADL mod I2. Pt will follow 3/4 simple one step motor commands IND3. Pt will sequence through set up ADL task INDOutcome: Interventions implemented as appropriate Problem: Impaired Wound HealingGoal: Optimal Wound HealingOutcome: Interventions implemented as appropriate Problem: Fall Injury RiskGoal: Absence of Fall and Fall-Related InjuryOutcome: Interventions implemented as appropriate Problem: Violence Risk or ActualGoal: Anger and Impulse ControlOutcome: Interventions implemented as appropriate Problem: Skin Injury Risk IncreasedGoal: Skin Health and IntegrityOutcome: Interventions implemented as appropriate

## 2022-02-23 NOTE — Other
Updated patient's sister, Audie Pinto, on the plan of care, and answered all questions.Camera Krienke PA-CHospital MedicineBest reached on MHB4:25 PM3/04/2022

## 2022-02-23 NOTE — Progress Notes
[facility] Medicine Progress NotePatient: Danielle [name], a 66 y.o. femaleAttending Provider: Truman Hayward, MDAdmission Date: 2/21/2023Hospital Day: 12Subjective: Interim History: Patient was seen and examined this morning, no acute issues overnight. Review of Hx/Allergies/Meds: I have reviewed the patient's: current scheduled medications and current prn medicationsPMH: No past medical history on file. PSH: No past surgical history on file. Allergies: has No Known Allergies. Current Medications:Scheduled: Current Facility-Administered Medications Medication Dose Route Frequency Provider Last Rate Last Admin ? [Held by provider] apixaban (ELIQUIS) tablet 5 mg  5 mg Per G Tube Q12H Littie Deeds, MD     ? chlorhexidine gluconate (PERIDEX) 0.12 % solution 15 mL  15 mL Mouth/Throat BID Dorisann Frames, MD   15 mL at 02/22/22 2209 ? Elemental iron 60 mg (300 mg Ferrous sulfate) / 5 mL oral liquid 45 mg  45 mg Per G Tube BID Dorisann Frames, MD   45 mg at 02/22/22 2209 ? enoxaparin (LOVENOX) injection 80 mg  80 mg Subcutaneous Q12H Truman Hayward, MD   80 mg at 02/22/22 2209 ? famotidine (PEPCID) suspension (8 mg/mL) 20 mg  20 mg Per G Tube Daily Dorisann Frames, MD   20 mg at 02/22/22 0957 ? folic acid (FOLVITE) tablet 1 mg  1 mg Per G Tube Daily Dorisann Frames, MD   1 mg at 02/22/22 1610 ? niacin Immediate Release tablet 100 mg  100 mg Per G Tube Nightly Dorisann Frames, MD   100 mg at 02/22/22 2209 ? pyridoxine (vitamin B6) (B-6) tablet 100 mg  100 mg Per G Tube Daily Dorisann Frames, MD   100 mg at 02/22/22 9604 ? [Held by provider] rosuvastatin (CRESTOR) tablet 10 mg  10 mg Per G Tube Nightly Dorisann Frames, MD   10 mg at 02/14/22 2245 ? sodium chloride 0.9 % (new bag) bolus 1,000 mL  1,000 mL Intravenous Once Izetta Sakamoto, MD     ? sodium chloride 0.9 % flush 3 mL  3 mL IV Push Q8H Dorisann Frames, MD   3 mL at 02/22/22 1428 ? thiamine (VITAMIN B1) tablet 100 mg  100 mg Per G Tube BID Dorisann Frames, MD   100 mg at 02/22/22 2209  Continuous Infusions:  PRN: acetaminophen, dextrose (GLUCOSE) oral liquid 15 g **OR** fruit juice **OR** skim milk, dextrose (GLUCOSE) oral liquid 30 g **OR** fruit juice, dextrose injection, dextrose injection, glucagon, sodium chloride Objective: I have reviewed the patient's current vital signs as documented in the patient's EMR.Vitals (Last 24 Hrs):Temp:  [97.9 ?F (36.6 ?C)-98.5 ?F (36.9 ?C)] 98.2 ?F (36.8 ?C)Pulse:  [88-102] 88Resp:  [16-18] 16BP: (104-127)/(70-84) 127/84SpO2:  [97 %-99 %] 97 %Device (Oxygen Therapy): room air on RAWt Readings from Last 3 Encounters: 02/14/22 79.4 kg 12/13/18 98.6 kg  I/O's (Last 24 Hrs):Gross Totals (Last 24 hours) at 02/23/2022 0757Last data filed at 02/23/2022 0429Intake 3048 ml Output 650 ml Net 2398 ml  Procedures:None Physical Exam: General Impression: Alert and oriented x1, not in acute distressHEENT: Normocephalic atraumatic, extra-ocular movements intactCardiovascular: Heart regular rate and rhythm, S1&S2 audibleChest: Lungs clear to auscultation bilaterally, no rhonchi, no wheezeAbdomen: Bowel sounds present, abdomen soft, non-tenderMusculoskeletal: Mild LE edemaNeurological: Answers simple questions, follows commands, moves both upper extremities spontaneously but not lower extremities except that she feels the pain when ellicitedLabs: CBCRecent Labs Lab 03/01/230359 03/02/230924 03/05/230448 WBC 6.8 6.0 7.8 HGB 8.9* 10.3* 9.0* HCT 30.40* 35.20 29.80* MCV  88.9 89.6 87.6 PLT 274 307 277 BMPRecent Labs Lab 02/28/230556 03/01/230359 03/02/230924 03/03/231144 03/04/230955 03/05/230448 NA 144 146*   < > 145* 146* 140 K 3.7 4.0   < > 3.5 3.9 3.7 CL 114* 114*   < > 113* 114* 109* CO2 23 25   < > 25 24 22  ANIONGAP 7 7   < > 7 8 9  BUN 15 19   < > 11 10 14  CREATININE 0.22* 0.28*   < > 0.24* 0.22* 0.21* CALCIUM 9.8 9.8   < > 10.3* 10.5* 9.6 MG 1.9 2.0  --   --   --   --  PHOS  --  2.2  --   --   --   --   < > = values in this interval not displayed.  LFTsRecent Labs Lab 03/02/230924 03/03/231144 03/04/230955 ALT 43* 34 30 AST 32 23 33 ALKPHOS 132* 113 115 BILITOT 0.3 0.2 0.4 ALBUMIN 2.6* 2.5* 2.7* CoagsRecent Labs Lab 02/28/230556 INR 1.10 PTT 26.8  GlucoseRecent Labs Lab 03/04/230903 03/04/230955 03/04/231256 03/04/231631 03/04/232116 03/05/230448 GLU 104* 93 117* 133* 114* 95  A1CNo results found for: HGBA1C Diagnostics: Imaging:No new radiology.Echo:No results found for this or any previous visit.ECG/Tele Events: No ECG ordered todayAssessment: 66 y.o. female PMHx RA on MTX (off since November), recent prolonged hospitalization in [place] for perforated diverticulitis s/p ex lap/R hemicolectomy/end ileostomy, persistent encephalopathy with multiple stable MRIs, surgical incision abscess s/p drainage, G-tube placement, DVT on AC a/w persistent encephalopathy, fevers and leukocytosis. ID consulted to help with source of infection, improving on IV abx.  Course c/b G-tube leaking, hypernatremia, transaminitis. She has ongoing encephalopathy without significant improvement, work up still ongoing. Plan: # Acute encephalopathy- Etiology so far unclear- s/p LP, initial results unremarkable, will follow up pending tests. Will need to r/o autoimmune process vs malignancy- MRI total spine with contrast ordered. Also added MRI brain w/wo per neurology recommendations - Scheduled for today- Follow up pending labs: encephalopathy panel (CSF and serum), CSF cytology- Appreciate the multidisciplinary evaluation in this case# Hypercalcemia- Could be related to prolonged immobilization but will need to rule out other etiologies- Pending PTHrP, 1,25 vit D- Cont IVF# Abnormal liver tests- Felt to be likely related to DILI, now improving- RUQ [place] with doplex pending today- Now found to have elevated IgG and smooth muscle antibodies which is concerning for possible autoimmune hepatitis - Liver biopsy scheduled on Monday 3/6, hepatology initially asked for transjugular approach given the presence of peritoneal nodules. I discussed this with IR and confirmed that they feel comfortable doing it percutaneous as they'll be able to tell if a nodule present and avoid it when doing liver bx. Dr. Cheral Marker from hepatology was ok with this approach based on IR recs- Cont to trend daily LFTs, no clear evidence of live dysfunction so far# Elevated FLC ratio# Iron deficiency anemia- Pending final UPEP and urine IFE- Peripheral smear- Copper and zinc levels pending- Will give IV iron once epidural infection is ruled out- Seen by hematology, elevated FLC ratio felt to be reactive and plasma cell disorder unlikely# Multiple peritoneal nodules- 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 Zenovia Jordan- Rheumatology following, current AMS and weakness felt to be unlikely related to her RA# Bilateral LE DVT- Diagnosed during recent hospitalization in [place]. Seen on [place] from 01/22/2022. Possibly provoked in  the setting of recent surgery but also now with concerns for malignancy- Cont lovenox# Fevers, leukocytosis- Now resolved, no clear source identified. Wonder if relate to DVT vs malignancy vs autoimmune? - Treated with 5 days of zosyn per IDDVT ppx: LovenoxDiet: Tube feedsDispo: Pending work up. Patient just moved to [place] and CM following up with her sister regarding dispo planning as she's looking into T19 applicationElectronically Signed:Asti Mackley, MDHospitalist Attending PhysicianYale [facility] [place], [place] [address]/5/20236:37 PMDISCLAIMER: This chart was created using M-Modal dictation software. Efforts were made by me to ensure accuracy, however some errors may be present due to limitations of this technology and occasionally words are not transcribed as intended.

## 2022-02-24 LAB — CBC WITHOUT DIFFERENTIAL
BKR WAM ANALYZER ANC: 4.53 x 1000/ÂµL (ref 2.00–7.60)
BKR WAM HEMATOCRIT (2 DEC): 30.9 % — ABNORMAL LOW (ref 35.00–45.00)
BKR WAM HEMOGLOBIN: 9.4 g/dL — ABNORMAL LOW (ref 11.7–15.5)
BKR WAM MCH (PG): 27 pg (ref 27.0–33.0)
BKR WAM MCHC: 30.4 g/dL — ABNORMAL LOW (ref 31.0–36.0)
BKR WAM MCV: 88.8 fL (ref 80.0–100.0)
BKR WAM MPV: 10.9 fL (ref 8.0–12.0)
BKR WAM PLATELETS: 322 x1000/ÂµL (ref 150–420)
BKR WAM RDW-CV: 19.8 % — ABNORMAL HIGH (ref 11.0–15.0)
BKR WAM RED BLOOD CELL COUNT.: 3.48 M/ÂµL — ABNORMAL LOW (ref 4.00–6.00)
BKR WAM WHITE BLOOD CELL COUNT: 7.3 x1000/ÂµL (ref 4.0–11.0)

## 2022-02-24 LAB — BASIC METABOLIC PANEL
BKR ANION GAP: 9 (ref 7–17)
BKR BLOOD UREA NITROGEN: 12 mg/dL (ref 8–23)
BKR BUN / CREAT RATIO: 54.5 — ABNORMAL HIGH (ref 8.0–23.0)
BKR CALCIUM: 9.7 mg/dL (ref 8.8–10.2)
BKR CHLORIDE: 107 mmol/L (ref 98–107)
BKR CO2: 23 mmol/L (ref 20–30)
BKR CREATININE: 0.22 mg/dL — ABNORMAL LOW (ref 0.40–1.30)
BKR EGFR, CREATININE (CKD-EPI 2021): >60 mL/min/{1.73_m2} (ref >=60)
BKR GLUCOSE: 96 mg/dL (ref 70–100)
BKR POTASSIUM: 3.9 mmol/L (ref 3.3–5.3)
BKR SODIUM: 139 mmol/L (ref 136–144)

## 2022-02-24 NOTE — Progress Notes
[facility] HealthMedicine Progress NoteAttending Provider: Marlon Pel, MDSubjective: Reason for Admission: Acute encephalopathyInterim History: Patient awake this morning resting in bed no acute complaints, denies any pain, AAO x1 which appears to be her baseline Review of Meds: Scheduled:Current Facility-Administered Medications Medication Dose Route Frequency Provider Last Rate Last Admin ? [Held by provider] apixaban (ELIQUIS) tablet 5 mg  5 mg Per G Tube Q12H Littie Deeds, MD     ? chlorhexidine gluconate (PERIDEX) 0.12 % solution 15 mL  15 mL Mouth/Throat BID Dorisann Frames, MD   15 mL at 02/23/22 2145 ? Elemental iron 60 mg (300 mg Ferrous sulfate) / 5 mL oral liquid 45 mg  45 mg Per G Tube BID Dorisann Frames, MD   45 mg at 02/23/22 2145 ? [Held by provider] enoxaparin (LOVENOX) injection 80 mg  80 mg Subcutaneous Q12H Chaar, Abdelkader, MD   80 mg at 02/23/22 0953 ? famotidine (PEPCID) suspension (8 mg/mL) 20 mg  20 mg Per G Tube Daily Dorisann Frames, MD   20 mg at 02/23/22 1610 ? folic acid (FOLVITE) tablet 1 mg  1 mg Per G Tube Daily Dorisann Frames, MD   1 mg at 02/23/22 9604 ? niacin Immediate Release tablet 100 mg  100 mg Per G Tube Nightly Dorisann Frames, MD   100 mg at 02/23/22 2145 ? pyridoxine (vitamin B6) (B-6) tablet 100 mg  100 mg Per G Tube Daily Dorisann Frames, MD   100 mg at 02/23/22 5409 ? [Held by provider] rosuvastatin (CRESTOR) tablet 10 mg  10 mg Per G Tube Nightly Dorisann Frames, MD   10 mg at 02/14/22 2245 ? sodium chloride 0.9 % (new bag) bolus 1,000 mL  1,000 mL Intravenous Once Chaar, Abdelkader, MD     ? sodium chloride 0.9 % flush 3 mL  3 mL IV Push Q8H Dorisann Frames, MD   3 mL at 02/24/22 0544 ? thiamine (VITAMIN B1) tablet 100 mg  100 mg Per G Tube BID Dorisann Frames, MD   100 mg at 02/23/22 2145 PRN: acetaminophen, dextrose (GLUCOSE) oral liquid 15 g **OR** fruit juice **OR** skim milk, dextrose (GLUCOSE) oral liquid 30 g **OR** fruit juice, dextrose injection, dextrose injection, glucagon, sodium chlorideContinuous Infusions: Objective: Vitals:Temp:  [98 ?F (36.7 ?C)-100 ?F (37.8 ?C)] 98 ?F (36.7 ?C)Pulse:  [88-106] 88Resp:  [18] 18BP: (102-119)/(66-77) 119/77SpO2:  [97 %-99 %] 99 %Device (Oxygen Therapy): room airI/O:Gross Totals (Last 24 hours) at 02/24/2022 0855Last data filed at 02/24/2022 0616Intake 2029.5 ml Output 1600 ml Net 429.5 ml Physical Exam:	Constitutional:  NAD, ill appearing HENT: Normocephalic, atraumatic. No scleral icterus. Neck with normal range of motion.CARD: Normal rate, regular rhythm. Normal heart sounds, intact pulses.PULML:  Effort normal, breath sounds normal. GI: Soft, nontender, nondistended. EXT: No edemaSKIN: Warm and dry. NEURO: Alert and oriented x1, moves upper extremities voluntarily, does not move bilateral lower extPSYCH: Mood and affect normal. Labs:Recent Labs Lab 03/02/230924 03/05/230448 03/06/230646 WBC 6.0 7.8 7.3 HGB 10.3* 9.0* 9.4* HCT 35.20 29.80* 30.90* PLT 307 277 322  Recent Labs Lab 03/01/230359 03/02/230924 03/05/230448 NEUTROPHILS 49.5 64.1 62.3  Recent Labs Lab 03/04/230955 03/04/231256 03/05/230448 03/05/230820 03/06/230532 03/06/230635 NA 146*  --  140  --  139  --  K 3.9  --  3.7  --  3.9  --  CL 114*  --  109*  --  107  --  CO2 24  --  22  --  23  --  BUN 10  --  14  --  12  --  CREATININE 0.22*  --  0.21*  --  0.22*  --  GLU 93   < > 95   < > 96 96 ANIONGAP 8  --  9  --  9  --   < > = values in this interval not displayed.  Recent Labs Lab 03/01/230359 03/02/230924 03/04/230955 03/05/230448 03/06/230532 CALCIUM 9.8   < > 10.5* 9.6 9.7 MG 2.0  --   --   --   --  PHOS 2.2  -- --   --   --   < > = values in this interval not displayed.  Recent Labs Lab 03/02/230924 03/03/231144 03/04/230955 ALT 43* 34 30 AST 32 23 33 ALKPHOS 132* 113 115 BILITOT 0.3 0.2 0.4  Recent Labs Lab 02/28/230556 PTT 26.8 LABPROT 11.4 INR 1.10  FINGERSTICKS: Recent Labs Lab 03/05/231227 03/05/231727 03/05/232052 03/06/230012 03/06/230532 03/06/230635 GLU 122* 126* 113* 173* 96 96 Micro:Lab Results Component Value Date  LABBLOO No Growth after 5 days of incubation 02/12/2022  LABBLOO No Growth after 5 days of incubation 02/12/2022 Diagnostics:Assessment: Ms [place] is a 66yo female with a history of Rheumatoid arthritis and recent prolonged hospitalization for perforated diverticulitis (s/p ex Lap/R hemicolectomy with end ileostomy) who presented for LTAc placement and persistent encephalopathy. In the ED pt found to have fevers, admitted by hospitalist service for further care. ID consulted for continued SIRS criteria without source. GI consulted for concerning imaging findings, Gen surgery consulted for G tube leaking Plan: Acute encephalopathyUnclear etiology, insetting of prolonged hospitalization from November of 2022 to Feb 2023S/p LP - unremarkable so farMRI brain - microvascular disease with no new findings, volume loss enlarged lateral and third ventricles can be seen with NPH MRI total spine - scoliotic deformity of thoracolumbar spine, moderate degenerative spondylosis of cervical and lumbar spine, no acute infectious pathology noted. 	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 In discussions with IR and GI today, on review of imaging of the nodules they are improving and less likely representative of carcinomatosis and instead continued resolution of debris from perforation. LFTs are improving but given positive SMA GI still would like and IR agrees with still going forward with liver biopsy 	GI following	IR followingElevated LFC ratioIron deficiency anemia	Hematology following - elevated FLC ratio felt to be reactive, plasma cell disorder unlikely Fevers and leukocytosis resolvedNo clear source identified, possibly 2/2 DVT vs autoimmune?S/p zosyn x5d Chronic conditionsRheumatoid Arthritis - has been off of MTX since November/acute illness, rheumatology following current ams/weakness not felt to be related to her RA Bilateral LE DVT - ?provoked from recent surgery, cont lovenox	# Hospital Issues- F: none- E: replete PRN- N: Tube feeds- GI: bowel regimen- PPx: lovenox - Code Status:Full ACLS# Dispo: Pending clinical improvement, likely LTC, family looking into T19 application I spent 50 minutes today on this encounter before, during and after the visit, reviewing labs and records, evaluating and examining the patient, entering orders and documenting the visit. Signed:Celene Pippins MDHospitalist Medicine Attending3/6/20238:55 AM

## 2022-02-24 NOTE — Plan of Care
Plan of Care Overview/ Patient Status    AOx1. Vitals stable. Room air. Repositioned x2. Tube feed flushes held at midnight per MD. Oral care done and moisturizer applied to lips. Ileostomy output noted. [place]. Bed in lowest position.  Problem: Adult Inpatient Plan of CareGoal: Plan of Care ReviewOutcome: Interventions implemented as appropriateGoal: Patient-Specific Goal (Individualized)Outcome: Interventions implemented as appropriateGoal: Absence of Hospital-Acquired Illness or InjuryOutcome: Interventions implemented as appropriateGoal: Optimal Comfort and WellbeingOutcome: Interventions implemented as appropriateGoal: Readiness for Transition of CareOutcome: Interventions implemented as appropriate Problem: InfectionGoal: Absence of Infection Signs and SymptomsOutcome: Interventions implemented as appropriate Problem: Physical Therapy GoalsGoal: Physical Therapy GoalsDescription: PT GOALS1. Patient will demonstrate active range of motion x 4 extremities in preparation for mobility2. Caregivers will be independent and safely implement a range of motion/positioning program to prevent loss of range of motion and skin integrity3. Patient will perform bed mobility with maximal assist4. Patient will tolerate sitting edge of bed >5 minutes with minimal assist5. Patient will perform transfers with moderate assist of 2 using rolling walker  Outcome: Interventions implemented as appropriate Problem: Occupational Therapy GoalsGoal: Occupational Therapy GoalsDescription: OT goals1. Pt will move to EOB for seated ADL mod I2. Pt will follow 3/4 simple one step motor commands IND3. Pt will sequence through set up ADL task INDOutcome: Interventions implemented as appropriate Problem: Impaired Wound HealingGoal: Optimal Wound HealingOutcome: Interventions implemented as appropriate Problem: Fall Injury RiskGoal: Absence of Fall and Fall-Related InjuryOutcome: Interventions implemented as appropriate Problem: Violence Risk or ActualGoal: Anger and Impulse ControlOutcome: Interventions implemented as appropriate Problem: Skin Injury Risk IncreasedGoal: Skin Health and IntegrityOutcome: Interventions implemented as appropriate

## 2022-02-24 NOTE — Plan of Care
Plan of Care Overview/ Patient Status    0700-1900Pt is A&Ox1-2. VSS. No pain unless moving BLE and back. No tele/SOB/n/v. Takes pill via PEG. Tube feed per MAR, flushes q4hrs. Incontinent of urine, ileostomy bag changed per MAR. Safety maintained with bed in lowest position and hourly rounding. MRI done today.Problem: Adult Inpatient Plan of CareGoal: Plan of Care ReviewOutcome: Interventions implemented as appropriateGoal: Patient-Specific Goal (Individualized)Outcome: Interventions implemented as appropriateGoal: Absence of Hospital-Acquired Illness or InjuryOutcome: Interventions implemented as appropriateGoal: Optimal Comfort and WellbeingOutcome: Interventions implemented as appropriate Problem: Skin Injury Risk IncreasedGoal: Skin Health and IntegrityOutcome: Interventions implemented as appropriate

## 2022-02-24 NOTE — Progress Notes
Brief Hepatology Note- LFTs have completely normalizedAssessment/Recommendations- Given completely normalized liver enzymes, would hold off on liver biopsy at this time- Would recommend checking liver enzymes three times a week [name] minimize phlebotomy- At this point, Hepatology will sign off. However, if there are any additional questions, please do not hesitate Danielle [name] reach outDiscussed with Dr. Leonette Most. [name], MDDigestive Diseases FellowI have reviewed and discussed the case with Dr. [name]. Her liver enzymes have normalized, suggesting a potential DILI as the likely original insult. Still unclear is the etiology of her AMS. Given that her liver enzymes have normalized, we will hold off on the liver biopsy. She will need her liver enzymes checked while inpatient as outlined above but as an outpatient periodically every 3 months [name] ensure there is no evolution into an autoimmune hepatitis given her multiple positive autoimmune markers. We will sign off but please do not hesitate [name] reach out with questions Wandra Mannan Alysha Doolan, MD

## 2022-02-25 ENCOUNTER — Encounter: Admit: 2022-02-25 | Payer: PRIVATE HEALTH INSURANCE | Attending: Internal Medicine | Primary: Internal Medicine

## 2022-02-25 ENCOUNTER — Inpatient Hospital Stay: Admit: 2022-02-25 | Payer: PRIVATE HEALTH INSURANCE

## 2022-02-25 DIAGNOSIS — M069 Rheumatoid arthritis, unspecified: Secondary | ICD-10-CM

## 2022-02-25 LAB — BASIC METABOLIC PANEL
BKR ANION GAP: 8 (ref 7–17)
BKR BLOOD UREA NITROGEN: 9 mg/dL (ref 8–23)
BKR BUN / CREAT RATIO: 32.1 — ABNORMAL HIGH (ref 8.0–23.0)
BKR CALCIUM: 10.6 mg/dL — ABNORMAL HIGH (ref 8.8–10.2)
BKR CHLORIDE: 105 mmol/L (ref 98–107)
BKR CO2: 25 mmol/L (ref 20–30)
BKR CREATININE: 0.28 mg/dL — ABNORMAL LOW (ref 0.40–1.30)
BKR EGFR, CREATININE (CKD-EPI 2021): >60 mL/min/{1.73_m2} (ref >=60)
BKR GLUCOSE: 94 mg/dL (ref 70–100)
BKR POTASSIUM: 3.4 mmol/L (ref 3.3–5.3)
BKR SODIUM: 138 mmol/L (ref 136–144)

## 2022-02-25 LAB — ZINC: ZINC: 47 ug/dL — ABNORMAL LOW (ref 60–130)

## 2022-02-25 LAB — VITAMIN D 1,25 (RENAL DISEASE OR HYPERCALCEMIA)(MINMETABLAB)(YH): VITAMIN D 1,25-DIHYDROXY: 43 pg/mL (ref 25–66)

## 2022-02-25 LAB — CBC WITHOUT DIFFERENTIAL
BKR WAM ANALYZER ANC: 4.97 x 1000/ÂµL (ref 2.00–7.60)
BKR WAM HEMATOCRIT (2 DEC): 31.1 % — ABNORMAL LOW (ref 35.00–45.00)
BKR WAM HEMOGLOBIN: 9.7 g/dL — ABNORMAL LOW (ref 11.7–15.5)
BKR WAM MCH (PG): 26.9 pg — ABNORMAL LOW (ref 27.0–33.0)
BKR WAM MCHC: 31.2 g/dL (ref 31.0–36.0)
BKR WAM MCV: 86.4 fL (ref 80.0–100.0)
BKR WAM MPV: 10.5 fL (ref 8.0–12.0)
BKR WAM PLATELETS: 324 x1000/ÂµL (ref 150–420)
BKR WAM RDW-CV: 19.6 % — ABNORMAL HIGH (ref 11.0–15.0)
BKR WAM RED BLOOD CELL COUNT.: 3.6 M/ÂµL — ABNORMAL LOW (ref 4.00–6.00)
BKR WAM WHITE BLOOD CELL COUNT: 7.5 x1000/ÂµL (ref 4.0–11.0)

## 2022-02-25 LAB — COPPER, SERUM: COPPER: 126 ug/dL (ref 70–175)

## 2022-02-25 NOTE — Progress Notes
[facility] HealthMedicine Progress NoteAttending Provider: Marlon Pel, MDSubjective: Reason for Admission: Acute encephalopathyInterim History: Patient resting in bed, no acute complaints. Review of Meds: Scheduled:Current Facility-Administered Medications Medication Dose Route Frequency Provider Last Rate Last Admin ? [Held by provider] apixaban (ELIQUIS) tablet 5 mg  5 mg Per G Tube Q12H Littie Deeds, MD     ? chlorhexidine gluconate (PERIDEX) 0.12 % solution 15 mL  15 mL Mouth/Throat BID Dorisann Frames, MD   15 mL at 02/25/22 1610 ? Elemental iron 60 mg (300 mg Ferrous sulfate) / 5 mL oral liquid 45 mg  45 mg Per G Tube BID Dorisann Frames, MD   45 mg at 02/25/22 9604 ? [Held by provider] enoxaparin (LOVENOX) injection 80 mg  80 mg Subcutaneous Q12H Chaar, Abdelkader, MD   80 mg at 02/23/22 0953 ? famotidine (PEPCID) suspension (8 mg/mL) 20 mg  20 mg Per G Tube Daily Dorisann Frames, MD   20 mg at 02/25/22 5409 ? folic acid (FOLVITE) tablet 1 mg  1 mg Per G Tube Daily Dorisann Frames, MD   1 mg at 02/25/22 8119 ? niacin Immediate Release tablet 100 mg  100 mg Per G Tube Nightly Dorisann Frames, MD   100 mg at 02/24/22 2042 ? pyridoxine (vitamin B6) (B-6) tablet 100 mg  100 mg Per G Tube Daily Dorisann Frames, MD   100 mg at 02/25/22 1478 ? [Held by provider] rosuvastatin (CRESTOR) tablet 10 mg  10 mg Per G Tube Nightly Dorisann Frames, MD   10 mg at 02/14/22 2245 ? sodium chloride 0.9 % (new bag) bolus 1,000 mL  1,000 mL Intravenous Once Chaar, Abdelkader, MD     ? sodium chloride 0.9 % flush 3 mL  3 mL IV Push Q8H Dorisann Frames, MD   3 mL at 02/24/22 0544 ? thiamine (VITAMIN B1) tablet 100 mg  100 mg Per G Tube BID Dorisann Frames, MD   100 mg at 02/25/22 2956 PRN: acetaminophen, dextrose (GLUCOSE) oral liquid 15 g **OR** fruit juice **OR** skim milk, dextrose (GLUCOSE) oral liquid 30 g **OR** fruit juice, dextrose injection, dextrose injection, glucagon, sodium chlorideContinuous Infusions: Objective: Vitals:Temp:  [98.1 ?F (36.7 ?C)-98.7 ?F (37.1 ?C)] 98.7 ?F (37.1 ?C)Pulse:  [92-101] 92Resp:  [16-18] 16BP: (92-115)/(63-75) 111/75SpO2:  [97 %-98 %] 98 %Device (Oxygen Therapy): room airI/O:Gross Totals (Last 24 hours) at 02/25/2022 0932Last data filed at 02/25/2022 0400Intake 1155 ml Output 375 ml Net 780 ml Physical Exam:	Constitutional:  NAD, ill appearing HENT: Normocephalic, atraumatic. No scleral icterus. Neck with normal range of motion.CARD: Normal rate, regular rhythm. Normal heart sounds, intact pulses.PULML:  Effort normal, breath sounds normal. GI: Soft, nontender, nondistended. EXT: No edemaSKIN: Warm and dry. NEURO: Alert and oriented x1, moves upper extremities voluntarily, does not move bilateral lower extPSYCH: Mood and affect normal. Labs:Recent Labs Lab 03/05/230448 03/06/230646 03/07/230516 WBC 7.8 7.3 7.5 HGB 9.0* 9.4* 9.7* HCT 29.80* 30.90* 31.10* PLT 277 322 324  Recent Labs Lab 03/01/230359 03/02/230924 03/05/230448 NEUTROPHILS 49.5 64.1 62.3  Recent Labs Lab 03/05/230448 03/05/230820 03/06/230532 03/06/230635 03/07/230516 03/07/230822 NA 140  --  139  --  138  --  K 3.7  --  3.9  --  3.4  --  CL 109*  --  107  --  105  --  CO2 22  --  23  --  25  --  BUN  14  --  12  --  9  --  CREATININE 0.21*  --  0.22*  --  0.28*  --  GLU 95   < > 96   < > 94 90 ANIONGAP 9  --  9  --  8  --   < > = values in this interval not displayed.  Recent Labs Lab 03/01/230359 03/02/230924 03/05/230448 03/06/230532 03/07/230516 CALCIUM 9.8   < > 9.6 9.7 10.6* MG 2.0  --   --   --   --  PHOS 2.2  --   --   --   --   < > = values in this interval not displayed.  Recent Labs Lab 03/02/230924 03/03/231144 03/04/230955 ALT 43* 34 30 AST 32 23 33 ALKPHOS 132* 113 115 BILITOT 0.3 0.2 0.4  No results for input(s): PTT, LABPROT, INR in the last 168 hours. FINGERSTICKS: Recent Labs Lab 03/06/230635 03/06/231312 03/06/231801 03/06/232122 03/07/230516 03/07/230822 GLU 96 108* 132* 92 94 90 Micro:Lab Results Component Value Date  LABBLOO No Growth after 5 days of incubation 02/12/2022  LABBLOO No Growth after 5 days of incubation 02/12/2022 Diagnostics:Assessment: Ms [place] is a 66yo female with a history of Rheumatoid arthritis and recent prolonged hospitalization for perforated diverticulitis (s/p ex Lap/R hemicolectomy with end ileostomy) who presented for LTAc placement and persistent encephalopathy. In the ED pt found to have fevers, admitted by hospitalist service for further care. ID consulted for continued SIRS criteria without source. GI consulted for concerning imaging findings, Gen surgery consulted for G tube leaking Plan: Acute encephalopathyUnclear etiology, insetting of prolonged hospitalization from November of 2022 to Feb 2023S/p LP - unremarkable so farMRI brain - microvascular disease with no new findings, volume loss enlarged lateral and third ventricles can be seen with NPH MRI total spine - scoliotic deformity of thoracolumbar spine, moderate degenerative spondylosis of cervical and lumbar spine, no acute infectious pathology noted. 	Follow rest of LP results (encephalopathy panel, CSF cytology)	Neurology following appreciate recommendations - workup so far negative, still pending CSF studies as above, and pt would benefit from EMG but is refusing. 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 On review of imaging of the nodules they are improving and less likely representative of carcinomatosis and instead continued resolution of debris from perforation. 	GI signing off - given normalized LFTs, likely DILI and so agree with holding off on biopsies at this time, continue with CMP 3x/week while here and then every 87mo outpatient 	IR signed offR lateral foot and L medial eminence deep tissue injuriesStable	Podiatry following - no dressing requirements, WBAT to BLE 	Bilateral xray feet ordered	No need for abx at this time Elevated LFC ratioIron deficiency anemia	Hematology following - elevated FLC ratio felt to be reactive, plasma cell disorder unlikely Fevers and leukocytosis resolvedNo clear source identified, possibly 2/2 DVT vs autoimmune?S/p zosyn x5d Chronic conditionsRheumatoid Arthritis - has been off of MTX since November/acute illness, rheumatology signing off current ams/weakness not felt to be related to her RA, follow up outpatient Bilateral LE DVT - ?provoked from recent surgery, cont lovenox	# Hospital Issues- F: none- E: replete PRN- N: Tube feeds- GI: bowel regimen- PPx: lovenox - Code Status:Full ACLS# Dispo: Pending clinical improvement, likely LTC, family looking into T19 application I spent 35 minutes  today on this encounter before, during and after the visit, reviewing labs and records, evaluating and examining the patient, entering orders and documenting the visit. Signed:Stephanieann Popescu MDHospitalist Medicine Attending3/7/20238:55 AM

## 2022-02-25 NOTE — Plan of Care
Plan of Care Overview/ Patient Status    Medical Social Work Follow Up  [employer] Most Recent Value Admission Information  Document Type Progress Note Reason for Inability to Assess --  [Patient mental status is altered] (For Inpatient/ED Only) Prior psychosocial assessment has been documented within this hospitalization No (For Inpatient/ED Only) Prior psychosocial assessment has been documented within 30 days of this hospitalization No Reason for Current Social Work Involvement Support/Coping Source of Information Family/Caregiver Family/Caregiver Comment Danielle [name] (Sister) Record Reviewed Yes Level of Care Inpatient Psychosocial issues requiring intervention Support Psychosocial interventions 10 minutes spent on the phone with Danielle [name] sister, Danielle [name] [phone]). I introduced myself, my role, and the reason for my call. Danielle reported that she was doing well and that she was happy that Danielle [name] was doing well also. Danielle reported that she had completed guardianship paperwork which she sent to [place] so that she can then request a transfer to [place]. She reported that she had started the [facility] application but does not have all the information regarding her accounts. She reported that she had contacted Danielle [name]'s landlord to request the her mail be forwarded to her but she has not received any as of yet. She stated that she had also placed a forward with the post office as well. Danielle reported that she was encouraged to submit the application to Medicaid and then they will request what is missing which she stated shw ould do. I provided support and encouraged her to reach out should she have any needs. Danielle appreciated my support and I thanked her for her time. Collaborations Danielle [place] (Sister) Specific referrals to enhance community supports (include existing and new resources) Capacity evaluation, Management consultant, T-19 application submission Handoff Required? No Next Steps/Plan (including hand-off): I will remain involved for support as needed. Signature: Leim Fabry, LMSW Contact Information: [phone]

## 2022-02-25 NOTE — Progress Notes
Brief rheumatology notePatient reports feeling tired. Alert, maintains conversation, oriented to self, year, partially to place (could not remember the hospital). Denied joint pain. Per RN, was c/o leg pain but did not allow to be examined. She allowed me to examine her hands (no synovitis; signif wasting of intrinsic hand muscles). No nystagmus. Followed simple commands. Labs and imaging reviewed. ATPO neg. Prelim results from autoimmune encephalopathy panel negative. CSF w/o pleocytosis. MRI c/f NPH. A/P: RA, in remission. Secondary Sjogren syndrome (dry eyes/mouth, SSA ab+). Abnormal IFE but normal C3/C4 suggest absence of cryoglobulinemia (cryoglobulin test pending). Encephalopathy, etiology unclear, improving (?nutritional, eg thiamine def). ?NPH. Delirium (per d/w RN, MS fluctuates). Abnormal LFT, resolved. ?med related (abx).Peritoneal nodules on [place], apparently improved from prior (?inflammatory sequelae from bowel perforation); biopsy not pursued. Recent admission 10/2021-01/2022 with pneumoperitoneum after diverticular perf, feculent peritonitis s/p ex lap and R hemicolectomy, end ileostomy; FTT requiring PEG tube. B/l fem DVT. No indication for additional immunosuppressive therapy. Cont to hold MTX. Use artifical tears PRN for dry eyes. Mouth hygiene. If c/o dry mouth, can start on cevimeline 30 mg TID. Will arrange follow up in [facility] Rheumatology clinic.Discussed with Dr. Stormy Fabian. Christ Kick, MDRheumatology fellow

## 2022-02-25 NOTE — Plan of Care
Plan of Care Overview/ Patient Status    A&O1, VSS. Tube feed and flushes given as ordered. Difficulty moving patient due to pain BLE. Patient states this pain is not present during periods of immobility. MD aware. Resting with bed in lowest position, call light within reach, and fall bundle in place.

## 2022-02-25 NOTE — Progress Notes
Brief Neurology Progress NoteMRI brain and total spine completed yesterday. Movement artifact, but overall moderate degenerative disease in cervical and lumbar spine, no obvious epidural abscess or paraspinal collections, no discitis/osteomyelitis. Nothing specific to explain Danielle [name]'s lower extremity symptoms. CSF and serum autoimmune panels still pending. As previously stated patient would benefit from EMG to further assess lower extremity weakness, but currently could not tolerate as refusing even physical exam.Seen by attending neurologist, Dr. Celedonio Savage, at bedside today. Attending attestation to follow.Danielle [name], MDYale Neurology Resident03/06/23

## 2022-02-25 NOTE — Progress Notes
[facility] Hospital[facility]	Podiatry Progress NoteDate: 3/6/2023Patient Name: Danielle [name]: [id number] Attending Provider: Marlon Pel, MDCC: Bilateral foot woundsSubjective: Pt seen at bedside this AM in NAD with attending. NAEO. Limited history. PMH: PSH: No past medical history on file. No past surgical history on file. Social History: Family History: Social History Tobacco Use ? Smoking status: Not on file ? Smokeless tobacco: Not on file Substance Use Topics ? Alcohol use: Not on file  No family history on file. Allergies: Patient has no known allergies.Review of Systems: Unable to assess 2/2 AMSVitals: Vitals:  02/24/22 0748 BP: 119/77 Pulse: 88 Resp: 18 Temp: 98 ?F (36.7 ?C) Physical Exam: Gen: NADHead: NormocephalicPsych: AAO x 2Chest: CTAB, no r/r/wCardio: RRR, S1, S2, no m/r/gAbd: Soft NT, ND, +bsVascular: DP/PT pulses palpable. Normal CRT to all digits. Foot is warm to touch. Pedal hair growth is absentNeuro: Unable to assess MSK: Unable to ROM digits when asked, unable to participate in MMTDerm: deep tissue injury noted to the Right lateral foot without any open lesions. Superficial deep tissue injury at the right medial eminence. No SOI. Labs: Lab Results Component Value Date  WBC 7.3 02/24/2022  HGB 9.4 (L) 02/24/2022  HCT 30.90 (L) 02/24/2022  CREATININE 0.22 (L) 02/24/2022  BUN 12 02/24/2022  CO2 23 02/24/2022  PLT 322 02/24/2022  INR 1.10 02/18/2022  GLU 96 02/24/2022  K 3.9 02/24/2022  NA 139 02/24/2022  CL 107 02/24/2022  ALT 30 02/22/2022  AST 33 02/22/2022  TSH 1.670 02/17/2022  HSCRP 10.7 (H) 02/19/2022  SEDRATE 98 (H) 02/19/2022 Imaging: None Micro/Path: None Assessment/Plan: Danielle [name] is a 66 y.o. female with Right lateral foot and Left medial eminence deep tissue injuries, stable. No open wounds or SOI. ?- Recommend b/l foot Xrays for baseline assessment and r/o underlying erosions - No dressings required at this time- Recommend WBAT to BLE- Antibiotics per primary team - none indicated per podiatry standpointDispo: No indication for podiatric surgical intervention at this time.  Patient may follow-up with Dr. Aws Shere within 3-4 weeks of discharge.  Podiatry will continue to follow peripherally while in house. Please contact the resident on call with any acute changes, new issues, or questions.Seen with attending, Dr. Darral Dash page on-call podiatry resident with questions:?	SEE AMION FOR CALL SCHEDULE Electronically Signed by Earlie Raveling, DPM, March 6, 2023I saw and evaluated Danielle [name] with the resident. I agree with the findings and the plan of care as documented in the resident note.Electronically Signed by Donn Pierini, DPM, February 24, 2022

## 2022-02-26 LAB — ENCEPHALOPATHY, AUTOIMMUNE EVALUATION, SERUM (BH GH LMW Q YH)
AMPA-RECEPTOR AB CBA, SERUM: NEGATIVE
AMPHIPHYSIN ANTIBODY: NEGATIVE
ANTI-GLIAL NUCLEAR AB, TYPE 1: NEGATIVE
ANTI-NEURONAL NUCLEAR AB, TYPE 1: NEGATIVE
ANTI-NEURONAL NUCLEAR AB, TYPE 2: NEGATIVE
ANTI-NEURONAL NUCLEAR AB, TYPE 3: NEGATIVE
CASPR2-IGG CBA, S: NEGATIVE
CRMP-5, IGG: NEGATIVE
DPPX AB IFA, SERUM: NEGATIVE
GABA-B-RECEPTOR AB CBA, SERUM: NEGATIVE
GFAP IFA, SERUM: NEGATIVE
GLUTAMIC ACID DECARBOXYLASE (GAD65) AB: 0 nmol/L (ref <=0.02)
IGLON5 IFA, SERUM: NEGATIVE
LGI1-IGG CBA, S: NEGATIVE
MGLUR1 AB IFA, S: NEGATIVE
NEUROCHONDRIN IFA, S (615867): NEGATIVE
NIF IFA, SERUM: NEGATIVE
NMDA-RECEPTOR AB CBA, SERUM: NEGATIVE
PURKINJE CELL CYTOPLASMIC AB TYPE 1: NEGATIVE
PURKINJE CELL CYTOPLASMIC AB TYPE 2: NEGATIVE
PURKINJE CELL CYTOPLASMIC AB TYPE TR: NEGATIVE
SEPTIN-7 IFA, S (615875): NEGATIVE

## 2022-02-26 NOTE — Plan of Care
Plan of Care Overview/ Patient Status    Problem: Adult Inpatient Plan of CareGoal: Readiness for Transition of CareOutcome: Interventions implemented as appropriate CM received a call from [name] (sister) who wanted to provide pt's PCP information from [facility]. Pt previously followed by Dr. Maryelizabeth Rowan [phone]. Elinor Parkinson, RN, MSNCare ManagerMHB: [phone]

## 2022-02-26 NOTE — Plan of Care
Plan of Care Overview/ Patient Status    A&Ox1, VSS. Patient moved to specialty bed and states they like bed change. Oral care performed. Q6 water flushes given as ordered. BLE intermittent pain with movement only. Patient refused AM labs by PCT and RN this AM. Resting with bed in lowest position, call light within reach, and fall bundle in place.

## 2022-02-26 NOTE — Progress Notes
[facility] Progress NoteAttending Provider: Marlon Pel, MDSubjective: Reason for Admission: Acute encephalopathyInterim History: Patient doing ok this morning resting comfortably in bed with no acute complaints. Discussed autoimmune panel which has come back negative. Review of Meds: Scheduled:Current Facility-Administered Medications Medication Dose Route Frequency Provider Last Rate Last Admin ? [Held by provider] apixaban (ELIQUIS) tablet 5 mg  5 mg Per G Tube Q12H Littie Deeds, MD     ? chlorhexidine gluconate (PERIDEX) 0.12 % solution 15 mL  15 mL Mouth/Throat BID Dorisann Frames, MD   15 mL at 02/25/22 2013 ? Elemental iron 60 mg (300 mg Ferrous sulfate) / 5 mL oral liquid 45 mg  45 mg Per G Tube BID Dorisann Frames, MD   45 mg at 02/25/22 2013 ? [Held by provider] enoxaparin (LOVENOX) injection 80 mg  80 mg Subcutaneous Q12H Chaar, Abdelkader, MD   80 mg at 02/23/22 0953 ? famotidine (PEPCID) suspension (8 mg/mL) 20 mg  20 mg Per G Tube Daily Dorisann Frames, MD   20 mg at 02/25/22 1610 ? folic acid (FOLVITE) tablet 1 mg  1 mg Per G Tube Daily Dorisann Frames, MD   1 mg at 02/25/22 9604 ? niacin Immediate Release tablet 100 mg  100 mg Per G Tube Nightly Dorisann Frames, MD   100 mg at 02/25/22 2013 ? pyridoxine (vitamin B6) (B-6) tablet 100 mg  100 mg Per G Tube Daily Dorisann Frames, MD   100 mg at 02/25/22 5409 ? [Held by provider] rosuvastatin (CRESTOR) tablet 10 mg  10 mg Per G Tube Nightly Dorisann Frames, MD   10 mg at 02/14/22 2245 ? sodium chloride 0.9 % (new bag) bolus 1,000 mL  1,000 mL Intravenous Once Chaar, Abdelkader, MD     ? sodium chloride 0.9 % flush 3 mL  3 mL IV Push Q8H Dorisann Frames, MD   3 mL at 02/24/22 0544 ? thiamine (VITAMIN B1) tablet 100 mg  100 mg Per G Tube BID Dorisann Frames, MD   100 mg at 02/25/22 2013 PRN: acetaminophen, dextrose (GLUCOSE) oral liquid 15 g **OR** fruit juice **OR** skim milk, dextrose (GLUCOSE) oral liquid 30 g **OR** fruit juice, dextrose injection, dextrose injection, glucagon, sodium chlorideContinuous Infusions: Objective: Vitals:Temp:  [97.3 ?F (36.3 ?C)-98.8 ?F (37.1 ?C)] 98 ?F (36.7 ?C)Pulse:  [90-99] 90Resp:  [16-20] 18BP: (111-124)/(74-81) 111/74SpO2:  [99 %] 99 %Device (Oxygen Therapy): room airI/O:Gross Totals (Last 24 hours) at 02/26/2022 0852Last data filed at 02/26/2022 0600Intake 755 ml Output 175 ml Net 580 ml Physical Exam:	Constitutional:  NAD, ill appearing HENT: Normocephalic, atraumatic. No scleral icterus. Neck with normal range of motion.CARD: Normal rate, regular rhythm. Normal heart sounds, intact pulses.PULML:  Effort normal, breath sounds normal. GI: Soft, nontender, nondistended. EXT: No edemaSKIN: Warm and dry. NEURO: Alert and oriented x1, moves upper extremities voluntarilyPSYCH: Mood and affect normal. Labs:Recent Labs Lab 03/05/230448 03/06/230646 03/07/230516 WBC 7.8 7.3 7.5 HGB 9.0* 9.4* 9.7* HCT 29.80* 30.90* 31.10* PLT 277 322 324  Recent Labs Lab 03/02/230924 03/05/230448 NEUTROPHILS 64.1 62.3  Recent Labs Lab 03/05/230448 03/05/230820 03/06/230532 03/06/230635 03/07/230516 03/07/230822 03/07/232102 NA 140  --  139  --  138  --   --  K 3.7  --  3.9  --  3.4  --   --  CL 109*  --  107  --  105  --   --  CO2 22  --  23  --  25  --   --  BUN 14  --  12  --  9  --   --  CREATININE 0.21*  --  0.22*  --  0.28*  --   --  GLU 95   < > 96   < > 94   < > 124* ANIONGAP 9  --  9  --  8  --   --   < > = values in this interval not displayed.  Recent Labs Lab 03/05/230448 03/06/230532 03/07/230516 CALCIUM 9.6 9.7 10.6*  Recent Labs Lab 03/02/230924 03/03/231144 03/04/230955 ALT 43* 34 30 AST 32 23 33 ALKPHOS 132* 113 115 BILITOT 0.3 0.2 0.4  No results for input(s): PTT, LABPROT, INR in the last 168 hours. FINGERSTICKS: Recent Labs Lab 03/07/230516 03/07/230822 03/07/231134 03/07/231201 03/07/231705 03/07/232102 GLU 94 90 119* 111* 121* 124* Micro:Lab Results Component Value Date  LABBLOO No Growth after 5 days of incubation 02/12/2022  LABBLOO No Growth after 5 days of incubation 02/12/2022 Diagnostics:Assessment: Ms Solberg is a 66yo female with a history of Rheumatoid arthritis and recent prolonged hospitalization for perforated diverticulitis (s/p ex Lap/R hemicolectomy with end ileostomy) who presented for LTAc placement and persistent encephalopathy. In the ED pt found to have fevers, admitted by hospitalist service for further care. ID consulted for continued SIRS criteria without source. 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 CSF labs and autoimmune encephalopathy panel negative	Neurology signing off appreciate recommendations. Still would recommend EMG to complete workup of encephalopathy and LE weakness but patient refusing so no further workup at this time Hypercalcemia mild, recurrent?2/2 prolonged immobilizationS/p IVF - improved with IVF consider restarting Vit D normal	PTHrP in process Abnormal LFTs improved Multiple peritoneal nodulesPossibly DILI, now improvingWorkup revealed elevated IgG and smooth muscle antibodies concerning for possible autoimmune hepatitis GI signed off - given normalized LFTs, likely DILI and so agree with holding off on biopsies at this time, continue with CMP 3x/week while here and then every 80mo outpatient 	Initial plan for biopsies via a transjugular approach given presence of peritoneal nodules. On review of imaging of the nodules they are improving and less likely representative of carcinomatosis and instead continued resolution of debris from perforation. R lateral foot and L medial eminence deep tissue injuriesStable	Podiatry following - no dressing requirements, WBAT to BLE 	Bilateral xray feet ordered	No need for abx at this time Elevated LFC ratioIron deficiency anemia	Hematology following - elevated FLC ratio felt to be reactive, plasma cell disorder unlikely Fevers and leukocytosis resolvedNo clear source identified, possibly 2/2 DVT vs autoimmune?S/p zosyn x5d Chronic conditionsRheumatoid Arthritis - has been off of MTX since November/acute illness, rheumatology signing off current ams/weakness not felt to be related to her RA, follow up outpatient Bilateral LE DVT - ?provoked from recent surgery, cont lovenox	# Hospital Issues- F: none- E: replete PRN- N: Tube feeds- GI: bowel regimen- PPx: lovenox - Code Status:Full ACLS# Dispo: Pending clinical improvement, likely LTC, family looking into T19 application I spent 35 minutes today on this encounter before, during and after the visit, reviewing labs and records, evaluating  and examining the patient, entering orders and documenting the visit. Signed:Jaylia Pettus MDHospitalist Medicine Attending3/8/20238:55 AM

## 2022-02-26 NOTE — Plan of Care
[facility]	Spiritual Care NotePurpose: Referral Source: Chaplain Initiated Observation: People present/Information Obtained From: Patient Emotional Mood: Calm Quality of Relational Support: Familiy In-Town and Involved     Subjective: Pt is resting in her bed but is open to visit by chaplain.  She is pleasant and very nice lady as she engaged in conversation.  Chaplain was a listening presence.  Prayer and spiritual support offered and accepted by patient. Spiritual Assessment:Referral Source: Chaplain Initiated Information Obtained From: Patient Mood: Merchandiser, retail of Relational Support: Familiy In-Town and Involved Religious Affiliation: [employer]  Spiritual Resources: [employer] Religious Practices: Faith Engineer, site RaisedRaised by Patient: Adjustment to [employer] by Family: Concern about Patient's Outcome Faith: Spiritual Beliefs: yes Spiritual Interventions:Spiritual Intervention Index**Date of Spiritual Visit: 03/08/23Visit Type: Initial VisitIntervention Type: Spiritual VisitResponding Chaplain: Unit ChaplainReligious Needs: PrayerSpiritual/Religious Support Provided: Companionship, Physicist, medical, Introduction to [employer], Prayer, Spiritual SupportOutcome: OUTCOMES: Expressed Spiritual/Religious Resources or Distress: Expressed hope, Expressed gratitude OUTCOMES: Expressed Emotional Resources or Distress: Emotions Expressed/Distress Reduced OUTCOMES: Progressed Spiritually: Established chaplain relationship OUTCOMES: Progressed Emotionally or Physically: Increased comfort, Increased satisfaction     Plan: Spiritual Care remain available as needed.  If any of the following needs arise, please contact spiritual care: N: New diagnosisE: Emotional / spiritual distressE: Existential distressD: Decision making / goals of care meetingsS: Support for staff and patients' loved ones C: Compromised copingA: Anxiety / stress / grief / loneliness R: Religious / cultural / ritual needsE: End-of-life care / death / dying	Signed: Chaplain:  Rev. Kashena Novitski LawsonMobile Heart Beat [phone]. 8:00 am - 4:30 pm.  Please page the on-call chaplain 24/7 for immediate emotional/spiritual/religious support between visits using pager [phone]. Plan of Care Overview/ Patient Status

## 2022-02-26 NOTE — Progress Notes
Brief Neurology Progress Note55 year old woman intitally transferred from [place] where she was extensively workup for altered mental status. Course c/b diverticulitis in Nov 2022 with bowel perf, incision abscess, staph bacteremia and DVT  (on Eliquis). All of her workup thus far including MRI brain and total spine and EEG were unremarkable. Additionally, LP didn't show any evidence of CNS infection. Autoimmune encephalopathy panel returned today as negative. Over hospital course she has had some cognitive improvement, but persistent lower extremity weakness and pain. She has been refusing thorough exam for this reason. On recent exam she did withdraw both legs to Babinski. While we have discussed additional work up for the weakness should include EMG this is also refused by patient.No additional neurologic work up needed at this time. Neurology signing off. Please feel free to reach out with additional questions. Agustina Witzke Andreas Ohm, MDYale Neurology Resident03/08/23

## 2022-02-27 LAB — ENCEPHALOPATHY, AUTOIMMUNE EVALUATION, CSF (BH GH LMW YH)
AMPA-RECEPTOR AB CBA, CSF: NEGATIVE
AMPHIPHYSIN ANTIBODY CSF: NEGATIVE
ANTI-GLIAL NUCLEAR AB, TYPE 1 CSF: NEGATIVE
CASPR2-IGG CBA, CSF: NEGATIVE
CRMP-5, IGG CSF: NEGATIVE
DPPX AB IFA, CSF: NEGATIVE
GABA-B-RECEPTOR AB CBA, CSF: NEGATIVE
GAD65 AB ASSAY, CSF: 0 nmol/L (ref <=0.02)
GFAP IFA, CSF: NEGATIVE
IGLON5 IFA, CSF: NEGATIVE
LGI1-IGG CBA, CSF: NEGATIVE
MGLUR1 AB IFA, CSF: NEGATIVE
NEUROCHONDRIN IFA, CSF (615866): NEGATIVE
NEURONAL NUCLEAR AB, TYPE 1 CSF: NEGATIVE
NEURONAL NUCLEAR AB, TYPE 2 CSF: NEGATIVE
NEURONAL NUCLEAR AB, TYPE 3 CSF: NEGATIVE
NIF IFA, CSF: NEGATIVE
NMDA-RECEPTOR AB CBA, CSF: NEGATIVE
PURKINJE CELL CYTOPLASMIC AB TYPE 1 CSF: NEGATIVE
PURKINJE CELL CYTOPLASMIC AB TYPE 2 CSF: NEGATIVE
PURKINJE CELL CYTOPLASMIC AB TYPE TR CSF: NEGATIVE
SEPTIN-7 IFA, CSF (615874): NEGATIVE

## 2022-02-27 LAB — CRYOGLOBULIN: BKR CRYOGLOBULIN: NEGATIVE

## 2022-02-27 NOTE — Plan of Care
[address]					Location: EP [id number] y.o., female				Attending: Marlon Pel, MD	Admit Date: 02/11/2022			[id number] LOS: 16 days FOLLOW-UP NUTRITION ASSESSMENT Dietary Orders (From admission, onward)     Start     Ordered  02/26/22 0952  Diet Tube Feed No Tray  DIET EFFECTIVE NOW      Comments: PIVOT 1.5 @ 1 carton bolus TID @9am ,1pm,5pm with 1.5 cartons at 9pm to total 5.5 cartons daily) Question Answer Comment Adult Tube Feed Formulas: Pivot 1.5 (BH,MH,YNH,SRC)  Feeding Route: G-Tube  Continuous Rate ml/hr: Other(see comments)  Initiate Nutrition Management Protocol (Yes/No?) Yes - Initiate Protocol    02/26/22 0952    No Known AllergiesANTHROPOMETRICS:Height:???70Admit wt:?no recent wt to assess.?Current wt:?79.4 kg (02/14/22)?Dosing Wt:?78?kg (based on?hamwi +10%)BMI:?25??(based on?current wt)?Additional wt information:Ht confirmed with pt: NO?- pt not able to provide hx. Pt not in a bed with scale. Pt last known wt was 98.6 kg (12/13/2018). Wt loss confirmed with admit wt as pt no longer weighs 98 kg. Time line for wt loss in not clear and pt cannot report. ??? ? Wt Readings from Last 20 Encounters: 12/13/18 98.6 kg ??NUTRITION-FOCUSED PHYSICAL EXAM:-Muscle Depletion: Moderate????????????temples, clavicles, shoulders, interosseous, scapula, calf-Subcutaneous Fat Depletion: Moderate ????????????orbital region, triceps, ribs?ESTIMATED NUTRITION REQUIREMENTS:Kcal/day:?1950-2340??????????????(25 -30?kcal/kg)Protein/day:?94 - 117?????????????(1.2-1.5gm/kg)Fluids/day:?1950-2340 mL/d?(55mL/kcal) ONLY as medical and fluid status allows and only per team discretion. Needs based on:?Dosing Wt (78?kg), ? Carcinomatosis, maintenance.?NUTRITION ASSESSMENT:?PT EMR reviewed for follow up assessment. Pt initially seen for tube feed consult. Pt  with hx of?RA, recent prolonged admit to OSH and d/c'd to rehab on 2/15 in [place]. Pt had recent PEG placed on 2/3. Was tolerating bolus feeds PTA. Pt continues to tolerate tube feeds well over this past week. Pt appears to be meeting needs with consistent supportive tube feed intake. Ileostomy in place. 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 Noted to have bilateral DVT's.?????Pt?not able to provide hx. Labs and meds reviewed and standard formula remains appropriate to continue. Labs notable for stable sodium, potassium, phos/ stable BUN/Cr, and stable glucose on goal rate feeds. No treatment provided for glucose control. Will maintain current feeding regimen as pt appears to be tolerating and meet 100% of estimated requirements. ??Cultural/Religious/Ethnic NeeTIBds:?Unknown?NUTRITION DIAGNOSIS:?Severe malnutrition r/t depletion aeb moderate muscle and fat depletion.- continues ?INTERVENTIONS/RECOMMENDATIONS:?Enteral/ Parenteral Nutrition?	Formula-?PIVOT 1.5?@ 1 carton bolus TID (@9am ,1pm,5pm) with 1.5 cartons at 9pm to total 5.5 cartons daily)-	Provides:?1.3L,?1955kcal,?121gram Protein,?986?mL free water/d-	Additional Free water @?960?mL/d as medical/ fluid status allows at Teams discretion. Pt getting 2400 ml free water through flushes with every 4 hours. ?	Goal: ?-	Patient will receive >90% of goal volume TF/ TPN prior to next nutrition assessment- met will continue ??Discharge and Transfer of Nutrition Care to New Setting or Provider:?	?Nutrition Prescription- Discharge needs not determined at this time will continue to follow ?MONITORING / EVALUATION:Food/Nutrition-Related Outcomes:????????????food and nutrient intake/administration.Anthropometric Outcomes:????????????weightBiochemical Data, Medical Tests, and Procedure Outcomes:????????????Labs (General chemistry, renal labs, Glucose).Nutrition-Focused Physical Finding Outcomes:????????????Physical appearance, muscle and fat wasting, swallow function, appetite.??Electronically signed by Sherrilee Gilles, RD, March 9, 2023MHB- [phone] Please note: Nutrition is a consult only service on weekends and holidays.  Please enter a consult in EPIC if assistance is needed on a weekend or holiday or via M[facility] [phone] to contact the covering RD.

## 2022-02-27 NOTE — Progress Notes
[facility] HealthMedicine Progress NoteAttending Provider: Marlon Pel, MDSubjective: Reason for Admission: Acute encephalopathyInterim History: Patient resting comfortably in bed no acute events overnight. Review of Meds: Scheduled:Current Facility-Administered Medications Medication Dose Route Frequency Provider Last Rate Last Admin ? [Held by provider] apixaban (ELIQUIS) tablet 5 mg  5 mg Per G Tube Q12H Littie Deeds, MD     ? chlorhexidine gluconate (PERIDEX) 0.12 % solution 15 mL  15 mL Mouth/Throat BID Dorisann Frames, MD   15 mL at 02/27/22 9147 ? Elemental iron 60 mg (300 mg Ferrous sulfate) / 5 mL oral liquid 45 mg  45 mg Per G Tube BID Dorisann Frames, MD   45 mg at 02/27/22 8295 ? [Held by provider] enoxaparin (LOVENOX) injection 80 mg  80 mg Subcutaneous Q12H Chaar, Abdelkader, MD   80 mg at 02/23/22 0953 ? famotidine (PEPCID) suspension (8 mg/mL) 20 mg  20 mg Per G Tube Daily Dorisann Frames, MD   20 mg at 02/27/22 6213 ? folic acid (FOLVITE) tablet 1 mg  1 mg Per G Tube Daily Dorisann Frames, MD   1 mg at 02/27/22 0865 ? niacin Immediate Release tablet 100 mg  100 mg Per G Tube Nightly Dorisann Frames, MD   100 mg at 02/26/22 2048 ? pyridoxine (vitamin B6) (B-6) tablet 100 mg  100 mg Per G Tube Daily Dorisann Frames, MD   100 mg at 02/27/22 7846 ? [Held by provider] rosuvastatin (CRESTOR) tablet 10 mg  10 mg Per G Tube Nightly Dorisann Frames, MD   10 mg at 02/14/22 2245 ? sodium chloride 0.9 % (new bag) bolus 1,000 mL  1,000 mL Intravenous Once Chaar, Abdelkader, MD     ? sodium chloride 0.9 % flush 3 mL  3 mL IV Push Q8H Dorisann Frames, MD   3 mL at 02/24/22 0544 ? thiamine (VITAMIN B1) tablet 100 mg  100 mg Per G Tube BID Dorisann Frames, MD   100 mg at 02/27/22 9629 PRN: acetaminophen, dextrose (GLUCOSE) oral liquid 15 g **OR** fruit juice **OR** skim milk, dextrose (GLUCOSE) oral liquid 30 g **OR** fruit juice, dextrose injection, dextrose injection, glucagon, sodium chlorideContinuous Infusions: Objective: Vitals:Temp:  [97.8 ?F (36.6 ?C)-98.9 ?F (37.2 ?C)] 98.7 ?F (37.1 ?C)Pulse:  [89-105] 89Resp:  [16-18] 16BP: (114-136)/(76-84) 128/84SpO2:  [99 %-100 %] 99 %Device (Oxygen Therapy): room airI/O:Gross Totals (Last 24 hours) at 02/27/2022 0854Last data filed at 02/27/2022 0844Intake 3222 ml Output 450 ml Net 2772 ml Physical Exam:	Constitutional:  NAD, ill appearing HENT: Normocephalic, atraumatic. No scleral icterus. Neck with normal range of motion.CARD: Normal rate, regular rhythm. Normal heart sounds, intact pulses.PULML:  Effort normal, breath sounds normal. GI: Soft, nontender, nondistended. EXT: No edemaSKIN: Warm and dry. NEURO: Alert and oriented x1, moves upper extremities voluntarilyPSYCH: Mood and affect normal. Labs:Recent Labs Lab 03/05/230448 03/06/230646 03/07/230516 WBC 7.8 7.3 7.5 HGB 9.0* 9.4* 9.7* HCT 29.80* 30.90* 31.10* PLT 277 322 324  Recent Labs Lab 03/02/230924 03/05/230448 NEUTROPHILS 64.1 62.3  Recent Labs Lab 03/05/230448 03/05/230820 03/06/230532 03/06/230635 03/07/230516 03/07/230822 03/09/230748 NA 140  --  139  --  138  --   --  K 3.7  --  3.9  --  3.4  --   --  CL 109*  --  107  --  105  --   --  CO2 22  --  23  --  25  --   --  BUN 14  --  12  --  9  --   --  CREATININE 0.21*  --  0.22*  --  0.28*  --   --  GLU 95   < > 96   < > 94   < > 113* ANIONGAP 9  --  9  --  8  --   --   < > = values in this interval not displayed.  Recent Labs Lab 03/05/230448 03/06/230532 03/07/230516 CALCIUM 9.6 9.7 10.6*  Recent Labs Lab 03/02/230924 03/03/231144 03/04/230955 ALT 43* 34 30 AST 32 23 33 ALKPHOS 132* 113 115 BILITOT 0.3 0.2 0.4  No results for input(s): PTT, LABPROT, INR in the last 168 hours. FINGERSTICKS: Recent Labs Lab 03/07/232102 03/08/230857 03/08/231259 03/08/231718 03/08/232120 03/09/230748 GLU 124* 91 106* 113* 143* 113* Micro:Lab Results Component Value Date  LABBLOO No Growth after 5 days of incubation 02/12/2022  LABBLOO No Growth after 5 days of incubation 02/12/2022 Diagnostics:Assessment: Ms Gerstenberger is a 66yo female with a history of Rheumatoid arthritis and recent prolonged hospitalization for perforated diverticulitis (s/p ex Lap/R hemicolectomy with end ileostomy) who presented for LTAc placement and persistent encephalopathy. In the ED pt found to have fevers, admitted by hospitalist service for further care. ID consulted for continued SIRS criteria without source. 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 CSF labs and autoimmune encephalopathy panel negativeNeurology has signed off appreciate recommendations. Still would recommend EMG to complete workup of encephalopathy and LE weakness but patient refusing so no further workup at this time Hypercalcemia mild, recurrent?2/2 prolonged immobilizationS/p IVF - improved with IVF consider restarting Vit D normal	PTHrP in process Abnormal LFTs improved Multiple peritoneal nodulesPossibly DILI, now improvingWorkup revealed elevated IgG and smooth muscle antibodies concerning for possible autoimmune hepatitis GI signed off - given normalized LFTs, likely DILI and so agree with holding off on biopsies at this time, continue with CMP 3x/week while here and then every 48mo outpatient 	Initial plan for biopsies via a transjugular approach given presence of peritoneal nodules. On review of imaging of the nodules they are improving and less likely representative of carcinomatosis and instead continued resolution of debris from perforation. 	CMP 3x/weekR lateral foot and L medial eminence deep tissue injuriesStable	Podiatry following - no dressing requirements, WBAT to BLE 	Bilateral xray feet ordered	No need for abx at this time Elevated LFC ratioIron deficiency anemia	Hematology following - elevated FLC ratio felt to be reactive, plasma cell disorder unlikely Fevers and leukocytosis resolvedNo clear source identified, possibly 2/2 DVT vs autoimmune?S/p zosyn x5d Chronic conditionsRheumatoid Arthritis - has been off of MTX since November/acute illness, rheumatology signing off current ams/weakness not felt to be related to her RA, follow up outpatient Bilateral LE DVT - ?provoked from recent surgery, cont lovenox	# Hospital Issues- F: none- E: replete PRN- N: Tube feeds- GI: bowel regimen- PPx: lovenox - Code Status:Full ACLS# Dispo: Pending clinical improvement, likely LTC, family looking into T19 application Medical decision making for this patient was lowSigned:Dorsie Sethi MDHospitalist Medicine Attending3/9/20238:55 AM

## 2022-02-27 NOTE — Plan of Care
Plan of Care Overview/ Patient Status    A&Ox1 - pleasantly confused. VSS. Patient on specialty bed. Rested well throughout shift. Refused turns, staff performed light ROM movements.  Resting with bed in lowest position, call light within reach, and fall bundle in place.

## 2022-02-28 LAB — COMPREHENSIVE METABOLIC PANEL
BKR A/G RATIO: 0.5 — ABNORMAL LOW (ref 1.0–2.2)
BKR ALANINE AMINOTRANSFERASE (ALT): 17 U/L (ref 10–35)
BKR ALBUMIN: 2.9 g/dL — ABNORMAL LOW (ref 3.6–4.9)
BKR ALKALINE PHOSPHATASE: 101 U/L (ref 9–122)
BKR ANION GAP: 8 (ref 7–17)
BKR ASPARTATE AMINOTRANSFERASE (AST): 23 U/L (ref 10–35)
BKR AST/ALT RATIO: 1.4
BKR BILIRUBIN TOTAL: 0.3 mg/dL (ref <=1.2)
BKR BLOOD UREA NITROGEN: 15 mg/dL (ref 8–23)
BKR BUN / CREAT RATIO: 50 — ABNORMAL HIGH (ref 8.0–23.0)
BKR CALCIUM: 11.5 mg/dL — ABNORMAL HIGH (ref 8.8–10.2)
BKR CHLORIDE: 107 mmol/L (ref 98–107)
BKR CO2: 27 mmol/L (ref 20–30)
BKR CREATININE: 0.3 mg/dL — ABNORMAL LOW (ref 0.40–1.30)
BKR EGFR, CREATININE (CKD-EPI 2021): >60 mL/min/{1.73_m2} (ref >=60)
BKR GLOBULIN: 5.7 g/dL — ABNORMAL HIGH (ref 2.3–3.5)
BKR GLUCOSE: 100 mg/dL (ref 70–100)
BKR POTASSIUM: 3.8 mmol/L (ref 3.3–5.3)
BKR PROTEIN TOTAL: 8.6 g/dL (ref 6.6–8.7)
BKR SODIUM: 142 mmol/L (ref 136–144)

## 2022-02-28 LAB — PTH-RELATED PROTEIN (PTH-RP)     (BH GH LMW Q YH): PTH-RP: 9 pg/mL — ABNORMAL LOW (ref 11–20)

## 2022-02-28 MED ORDER — FLU VACCINE QS2022-23(65YR UP)(PF)240 MCG/0.7 ML INTRAMUSCULAR SYRINGE
240 mcg/0.7 mL | INTRAMUSCULAR | Status: CP
Start: 2022-02-28 — End: ?
  Administered 2022-03-01: 17:00:00 240 mL via INTRAMUSCULAR

## 2022-02-28 NOTE — Plan of Care
Plan of Care Overview/ Patient Status    0730-1900: Alert to self only. Calm/cooperative. Soft spoken. VSS on room air. Denies pain. Lungs dim. NPO per order, oral care provided. Bolus tube feeding & flushes administered via G-tube per order, tolerating well. Dsg changed under PEG & skin cleaned, small amt brown/red drainage. Meds given through G-tube. Incontinent large amt urine, care provided. Ileostomy RLQ ostomy bag intact +liquid stool & flatus output. Midline abd mucous fistula ostomy bag intact, no output this shift. Turned & repositioned in bed q2 hours. Specialty bed in place. Heels offloaded w/ boots bilat. Barrier cream applied to coccyx. See flowsheets for skin assessment. Problem: Adult Inpatient Plan of CareGoal: Plan of Care Review3/09/2022 1629 by Sherryll Burger, RNOutcome: Interventions implemented as appropriateFlowsheets (Taken 02/28/2022 1629)Progress: no changePlan of Care Reviewed With: patient3/09/2022 1628 by Sherryll Burger, RNOutcome: Interventions implemented as appropriateFlowsheets (Taken 02/28/2022 1628)Progress: no changePlan of Care Reviewed With: patient Problem: Adult Inpatient Plan of CareGoal: Patient-Specific Goal (Individualized)02/28/2022 1629 by Sherryll Burger, RNOutcome: Interventions implemented as appropriateFlowsheets (Taken 02/28/2022 1617)What Anxieties, Fears, Concerns or Questions Do You Have About Your Care?: uta amsIndividualized Preferences and Care Needs: uta amsPatient Centered Long Term Goal: safe d/cPatient Centered Daily Goal: placement3/09/2022 1628 by Sherryll Burger, RNOutcome: Interventions implemented as appropriateFlowsheets (Taken 02/28/2022 1617)What Anxieties, Fears, Concerns or Questions Do You Have About Your Care?: uta amsIndividualized Preferences and Care Needs: uta amsPatient Centered Long Term Goal: safe d/cPatient Centered Daily Goal: placement

## 2022-02-28 NOTE — Progress Notes
[facility] HealthMedicine Progress NoteAttending Provider: Marlon Pel, MDSubjective: Reason for Admission: Acute encephalopathyInterim History: Patient doing ok this morning sitting up in bed with no acute complaintsReview of Meds: Scheduled:Current Facility-Administered Medications Medication Dose Route Frequency Provider Last Rate Last Admin ? [Held by provider] apixaban (ELIQUIS) tablet 5 mg  5 mg Per G Tube Q12H Littie Deeds, MD     ? chlorhexidine gluconate (PERIDEX) 0.12 % solution 15 mL  15 mL Mouth/Throat BID Dorisann Frames, MD   15 mL at 02/27/22 2118 ? Elemental iron 60 mg (300 mg Ferrous sulfate) / 5 mL oral liquid 45 mg  45 mg Per G Tube BID Dorisann Frames, MD   45 mg at 02/27/22 2118 ? enoxaparin (LOVENOX) injection 80 mg  80 mg Subcutaneous Q12H Chaar, Abdelkader, MD   80 mg at 02/27/22 2118 ? famotidine (PEPCID) suspension (8 mg/mL) 20 mg  20 mg Per G Tube Daily Dorisann Frames, MD   20 mg at 02/27/22 1610 ? folic acid (FOLVITE) tablet 1 mg  1 mg Per G Tube Daily Dorisann Frames, MD   1 mg at 02/27/22 9604 ? niacin Immediate Release tablet 100 mg  100 mg Per G Tube Nightly Dorisann Frames, MD   100 mg at 02/27/22 2118 ? pyridoxine (vitamin B6) (B-6) tablet 100 mg  100 mg Per G Tube Daily Dorisann Frames, MD   100 mg at 02/27/22 5409 ? [Held by provider] rosuvastatin (CRESTOR) tablet 10 mg  10 mg Per G Tube Nightly Dorisann Frames, MD   10 mg at 02/14/22 2245 ? sodium chloride 0.9 % (new bag) bolus 1,000 mL  1,000 mL Intravenous Once Chaar, Abdelkader, MD     ? sodium chloride 0.9 % flush 3 mL  3 mL IV Push Q8H Dorisann Frames, MD   3 mL at 02/27/22 1323 ? thiamine (VITAMIN B1) tablet 100 mg  100 mg Per G Tube BID Dorisann Frames, MD   100 mg at 02/27/22 2118 PRN: acetaminophen, dextrose (GLUCOSE) oral liquid 15 g **OR** fruit juice **OR** skim milk, dextrose (GLUCOSE) oral liquid 30 g **OR** fruit juice, dextrose injection, dextrose injection, glucagon, sodium chlorideContinuous Infusions: Objective: Vitals:Temp:  [97.4 ?F (36.3 ?C)-98 ?F (36.7 ?C)] 98 ?F (36.7 ?C)Pulse:  [85-102] 85Resp:  [16-19] 18BP: (100-110)/(63-74) 100/63SpO2:  [82 %-99 %] 97 %Device (Oxygen Therapy): room airI/O:Gross Totals (Last 24 hours) at 02/28/2022 0849Last data filed at 02/27/2022 2200Intake 2029 ml Output 350 ml Net 1679 ml Physical Exam:	Constitutional:  NAD, ill appearing HENT: Normocephalic, atraumatic. No scleral icterus. Neck with normal range of motion.CARD: Normal rate, regular rhythm. Normal heart sounds, intact pulses.PULML:  Effort normal, breath sounds normal. GI: Soft, nontender, nondistended. EXT: No edemaSKIN: Warm and dry. NEURO: Alert and oriented x1, moves upper extremities voluntarilyPSYCH: Mood and affect normal. Labs:Recent Labs Lab 03/05/230448 03/06/230646 03/07/230516 WBC 7.8 7.3 7.5 HGB 9.0* 9.4* 9.7* HCT 29.80* 30.90* 31.10* PLT 277 322 324  Recent Labs Lab 03/05/230448 NEUTROPHILS 62.3  Recent Labs Lab 03/06/230532 03/06/230635 03/07/230516 03/07/230822 03/10/230549 NA 139  --  138  --  142 K 3.9  --  3.4  --  3.8 CL 107  --  105  --  107 CO2 23  --  25  --  27 BUN 12  --  9  --  15 CREATININE 0.22*  --  0.28*  --  0.30* GLU 96   < >  94   < > 100 ANIONGAP 9  --  8  --  8  < > = values in this interval not displayed.  Recent Labs Lab 03/06/230532 03/07/230516 03/10/230549 CALCIUM 9.7 10.6* 11.5*  Recent Labs Lab 03/03/231144 03/04/230955 03/10/230549 ALT 34 30 17 AST 23 33 23 ALKPHOS 113 115 101 BILITOT 0.2 0.4 0.3  No results for input(s): PTT, LABPROT, INR in the last 168 hours. FINGERSTICKS: Recent Labs Lab 03/08/232120 03/09/230748 03/09/231200 03/09/231740 03/09/232121 03/10/230549 GLU 143* 113* 112* 129* 119* 100 Micro:Lab Results Component Value Date  LABBLOO No Growth after 5 days of incubation 02/12/2022  LABBLOO No Growth after 5 days of incubation 02/12/2022 Diagnostics:Assessment: Ms Eustache is a 66yo female with a history of Rheumatoid arthritis and recent prolonged hospitalization for perforated diverticulitis (s/p ex Lap/R hemicolectomy with end ileostomy) who presented for LTAc placement and persistent encephalopathy. In the ED pt found to have fevers, admitted by hospitalist service for further care. ID consulted for continued SIRS criteria without source. 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 CSF labs and autoimmune encephalopathy panel negativeNeurology has signed off appreciate recommendations. 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 On review of imaging of the nodules they are improving and less likely representative of carcinomatosis and instead continued resolution of debris from perforation. 	CMP 3x/weekR lateral foot and L medial eminence deep tissue injuriesStableXray feet with degenerative changes but no acute findings	Podiatry following - no dressing requirements, WBAT to BLE 	No need for abx at this time Elevated LFC ratioIron deficiency anemia	Hematology following - elevated FLC ratio felt to be reactive, plasma cell disorder unlikely Fevers and leukocytosis resolvedNo clear source identified, possibly 2/2 DVT vs autoimmune?S/p zosyn x5d Chronic conditionsRheumatoid Arthritis - has been off of MTX since November/acute illness, rheumatology signing off current ams/weakness not felt to be related to her RA, follow up outpatient Bilateral LE DVT - ?provoked from recent surgery, cont lovenox	# Hospital Issues- F: none- E: replete PRN- N: Tube feeds- GI: bowel regimen- PPx: lovenox - Code Status:Full ACLS# Dispo: Pending clinical improvement, likely LTC, family looking into T19 application Medical decision making for this patient was lowSigned:Treshawn Allen MDHospitalist Medicine Attending3/10/20238:55 AM

## 2022-02-28 NOTE — Plan of Care
Plan of Care Overview/ Patient Status    A&Ox1, VSS. Incontinent care performed as needed and Ileostomy bag changed using 1-piece appliance, patient tolerated well. Hourly rounding performed by RN and PCT. Tube feed and flushes administered per order. Pain BLE with movement, refusing tylenol. Resting with bed in lowest position, call light within reach, and fall bundle in place.

## 2022-02-28 NOTE — Plan of Care
Plan of Care Overview/ Patient Status    0700-1900Pt is alert to self only. VSS on RA. No sign of pain or respiratory distress noted. Assist x2 in bed. Incontinent of bladder. Ileostomy in place +flatus/stool. PEG in place, dressing changed to site as ordered. Medications administered crushed via PEG.Bolus TF and scheduled FWF administered as ordered. PIV patent. Waffle  boots in place and bil LE dressing changed this shift. Q2hr T&R performed. Hourly rounding performed, call bell within reach, bed alarm on. Will continue to follow POC.

## 2022-03-01 NOTE — Progress Notes
[facility] HealthMedicine Progress NoteAttending Provider: Marlon Pel, MDSubjective: Reason for Admission: Acute encephalopathyInterim History: Patient doing ok this morning resting comfortably in bed with no acute complaints Review of Meds: Scheduled:Current Facility-Administered Medications Medication Dose Route Frequency Provider Last Rate Last Admin ? [Held by provider] apixaban (ELIQUIS) tablet 5 mg  5 mg Per G Tube Q12H Littie Deeds, MD     ? chlorhexidine gluconate (PERIDEX) 0.12 % solution 15 mL  15 mL Mouth/Throat BID Dorisann Frames, MD   15 mL at 03/01/22 0906 ? Elemental iron 60 mg (300 mg Ferrous sulfate) / 5 mL oral liquid 45 mg  45 mg Per G Tube BID Dorisann Frames, MD   45 mg at 03/01/22 1324 ? enoxaparin (LOVENOX) injection 80 mg  80 mg Subcutaneous Q12H Chaar, Abdelkader, MD   80 mg at 03/01/22 4010 ? famotidine (PEPCID) suspension (8 mg/mL) 20 mg  20 mg Per G Tube Daily Dorisann Frames, MD   20 mg at 03/01/22 2725 ? folic acid (FOLVITE) tablet 1 mg  1 mg Per G Tube Daily Dorisann Frames, MD   1 mg at 03/01/22 3664 ? influenza vaccine (PF) 2022-2023 (HIGH DOSE > or = 68 yo) injection 0.7 mL  0.7 mL Intramuscular Vaccination x 1 Irine Seal, MD     ? niacin Immediate Release tablet 100 mg  100 mg Per G Tube Nightly Dorisann Frames, MD   100 mg at 02/28/22 2202 ? pyridoxine (vitamin B6) (B-6) tablet 100 mg  100 mg Per G Tube Daily Dorisann Frames, MD   100 mg at 03/01/22 4034 ? [Held by provider] rosuvastatin (CRESTOR) tablet 10 mg  10 mg Per G Tube Nightly Dorisann Frames, MD   10 mg at 02/14/22 2245 ? sodium chloride 0.9 % (new bag) bolus 1,000 mL  1,000 mL Intravenous Once Chaar, Abdelkader, MD     ? sodium chloride 0.9 % flush 3 mL  3 mL IV Push Q8H Dorisann Frames, MD   3 mL at 02/28/22 2202 ? thiamine (VITAMIN B1) tablet 100 mg  100 mg Per G Tube BID Dorisann Frames, MD   100 mg at 03/01/22 7425 PRN: acetaminophen, dextrose (GLUCOSE) oral liquid 15 g **OR** fruit juice **OR** skim milk, dextrose (GLUCOSE) oral liquid 30 g **OR** fruit juice, dextrose injection, dextrose injection, glucagon, sodium chlorideContinuous Infusions: Objective: Vitals:Temp:  [98.2 ?F (36.8 ?C)-99.3 ?F (37.4 ?C)] 98.2 ?F (36.8 ?C)Pulse:  [67-94] 67Resp:  [14-18] 14BP: (101-118)/(67-80) 101/67SpO2:  [99 %-100 %] 99 %Device (Oxygen Therapy): room airI/O:Gross Totals (Last 24 hours) at 03/01/2022 0939Last data filed at 03/01/2022 0630Intake 1911 ml Output 610 ml Net 1301 ml Physical Exam:	Constitutional:  NAD, ill appearing HENT: Normocephalic, atraumatic. No scleral icterus. Neck with normal range of motion.CARD: Normal rate, regular rhythm. Normal heart sounds, intact pulses.PULML:  Effort normal, breath sounds normal. GI: Soft, nontender, nondistended. EXT: No edemaSKIN: Warm and dry. NEURO: Alert and oriented x1, moves upper extremities voluntarilyPSYCH: Mood and affect normal. Labs:Recent Labs Lab 03/05/230448 03/06/230646 03/07/230516 WBC 7.8 7.3 7.5 HGB 9.0* 9.4* 9.7* HCT 29.80* 30.90* 31.10* PLT 277 322 324  Recent Labs Lab 03/05/230448 NEUTROPHILS 62.3  Recent Labs Lab 03/06/230532 03/06/230635 03/07/230516 03/07/230822 03/10/230549 03/10/230857 03/11/230839 NA 139  --  138  --  142  --   --  K 3.9  --  3.4  --  3.8  --   --  CL 107  --  105  --  107  --   --  CO2 23  --  25  --  27  --   --  BUN 12  --  9  --  15  --   --  CREATININE 0.22*  --  0.28*  --  0.30*  --   --  GLU 96   < > 94   < > 100   < > 96 ANIONGAP 9  --  8  --  8  --   --   < > = values in this interval not displayed.  Recent Labs Lab 03/06/230532 03/07/230516 03/10/230549 CALCIUM 9.7 10.6* 11.5* Recent Labs Lab 03/04/230955 03/10/230549 ALT 30 17 AST 33 23 ALKPHOS 115 101 BILITOT 0.4 0.3  No results for input(s): PTT, LABPROT, INR in the last 168 hours. FINGERSTICKS: Recent Labs Lab 03/10/230549 03/10/230857 03/10/231227 03/10/231638 03/10/232010 03/11/230839 GLU 100 84 96 120* 120* 96 Micro:Lab Results Component Value Date  LABBLOO No Growth after 5 days of incubation 02/12/2022  LABBLOO No Growth after 5 days of incubation 02/12/2022 Diagnostics:Assessment: Ms Graham is a 66yo female with a history of Rheumatoid arthritis and recent prolonged hospitalization for perforated diverticulitis (s/p ex Lap/R hemicolectomy with end ileostomy) who presented for LTAc placement and persistent encephalopathy. In the ED pt found to have fevers, admitted by hospitalist service for further care. ID consulted for continued SIRS criteria without source. 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 CSF labs and autoimmune encephalopathy panel negativeNeurology has signed off appreciate recommendations. 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 On review of imaging of the nodules they are improving and less likely representative of carcinomatosis and instead continued resolution of debris from perforation. 	CMP 3x/weekR lateral foot and L medial eminence deep tissue injuriesStableXray feet with degenerative changes but no acute findings	Podiatry following - no dressing requirements, WBAT to BLE 	No need for abx at this time Elevated LFC ratioIron deficiency anemia	Hematology following - elevated FLC ratio felt to be reactive, plasma cell disorder unlikely Fevers and leukocytosis resolvedNo clear source identified, possibly 2/2 DVT vs autoimmune?S/p zosyn x5d Chronic conditionsRheumatoid Arthritis - has been off of MTX since November/acute illness, rheumatology signing off current ams/weakness not felt to be related to her RA, follow up outpatient Bilateral LE DVT - ?provoked from recent surgery, cont lovenox	# Hospital Issues- F: none- E: replete PRN- N: Tube feeds- GI: bowel regimen- PPx: lovenox - Code Status:Full ACLS# Dispo: Pending clinical improvement, likely LTC, family looking into T19 application Medical decision  making for this patient was lowSigned:Nikkol Pai [facility] MDHospitalist Medicine Attending3/11/20238:55 AM

## 2022-03-01 NOTE — Plan of Care
Plan of Care Overview/ Patient Status    1900-0700: Pt A&Oxself. VSS on RA. No signs of pain or respiratory distress. Scheduled meds given per order. Ileostomy in place +flatus and stool. PEG tube in place. Bolus TF given 1.5 cartons of Pivot 1.5.  Dressings clean dry and intact. Blood glucose monitoring. Oral care refused.  Podus boots in place. T&Rq2h. Hourly rounding. Pt resting in bed. Safety precautions maintained, bed alarm on, call bell in reach, will ctm and follow nursing poc. Problem: Adult Inpatient Plan of CareGoal: Plan of Care Review3/10/2022 0819 by Su Grand, RNOutcome: Interventions implemented as appropriateFlowsheets (Taken 03/01/2022 0317)Progress: improvingPlan of Care Reviewed With: patient3/10/2022 1610 by Su Grand, RNOutcome: Interventions implemented as appropriate

## 2022-03-01 NOTE — Plan of Care
Plan of Care Overview/ Patient Status    0700-1900Patient is A&O to self and place. VSS on RA. Only complains of pain with movement, relieved with positioning. NPO. Oral care provided. PEG w/ drainage at site, cleansed with soap and water, new dressing placed.  Bolus TF per order. Ileostomy in place. Incontinent of urine, pure wick cath in place draining clear yellow urine. AX2 q2hr turn and reposition, specialty bed in place. See flow sheet for skin. Safety checks and hourly rounding performed, see flow sheet and MAR for details. Problem: Adult Inpatient Plan of CareGoal: Plan of Care ReviewOutcome: Interventions implemented as appropriateGoal: Patient-Specific Goal (Individualized)Outcome: Interventions implemented as appropriateGoal: Absence of Hospital-Acquired Illness or InjuryOutcome: Interventions implemented as appropriateGoal: Optimal Comfort and WellbeingOutcome: Interventions implemented as appropriateGoal: Readiness for Transition of CareOutcome: Interventions implemented as appropriate Problem: InfectionGoal: Absence of Infection Signs and SymptomsOutcome: Interventions implemented as appropriate Problem: Physical Therapy GoalsGoal: Physical Therapy GoalsDescription: PT GOALS1. Patient will demonstrate active range of motion x 4 extremities in preparation for mobility2. Caregivers will be independent and safely implement a range of motion/positioning program to prevent loss of range of motion and skin integrity3. Patient will perform bed mobility with maximal assist4. Patient will tolerate sitting edge of bed >5 minutes with minimal assist5. Patient will perform transfers with moderate assist of 2 using rolling walker  Outcome: Interventions implemented as appropriate Problem: Occupational Therapy GoalsGoal: Occupational Therapy GoalsDescription: OT goals1. Pt will move to EOB for seated ADL mod I2. Pt will follow 3/4 simple one step motor commands IND3. Pt will sequence through set up ADL task INDOutcome: Interventions implemented as appropriate Problem: Impaired Wound HealingGoal: Optimal Wound HealingOutcome: Interventions implemented as appropriate Problem: Fall Injury RiskGoal: Absence of Fall and Fall-Related InjuryOutcome: Interventions implemented as appropriate Problem: Violence Risk or ActualGoal: Anger and Impulse ControlOutcome: Interventions implemented as appropriate Problem: Skin Injury Risk IncreasedGoal: Skin Health and IntegrityOutcome: Interventions implemented as appropriate Problem: Mobility ImpairmentGoal: Optimal MobilityOutcome: Interventions implemented as appropriate Problem: Aspiration (Enteral Nutrition)Goal: Absence of Aspiration Signs and SymptomsOutcome: Interventions implemented as appropriate Problem: Device-Related Complication Risk (Enteral Nutrition)Goal: Safe, Effective Therapy DeliveryOutcome: Interventions implemented as appropriate Problem: Confusion AcuteGoal: Optimal Cognitive FunctionOutcome: Interventions implemented as appropriate

## 2022-03-02 LAB — COMPREHENSIVE METABOLIC PANEL
BKR A/G RATIO: 0.5 — ABNORMAL LOW (ref 1.0–2.2)
BKR ALANINE AMINOTRANSFERASE (ALT): 16 U/L (ref 10–35)
BKR ALBUMIN: 2.8 g/dL — ABNORMAL LOW (ref 3.6–4.9)
BKR ALKALINE PHOSPHATASE: 91 U/L (ref 9–122)
BKR ANION GAP: 7 (ref 7–17)
BKR ASPARTATE AMINOTRANSFERASE (AST): 19 U/L (ref 10–35)
BKR AST/ALT RATIO: 1.2
BKR BILIRUBIN TOTAL: 0.2 mg/dL (ref <=1.2)
BKR BLOOD UREA NITROGEN: 15 mg/dL (ref 8–23)
BKR BUN / CREAT RATIO: 51.7 — ABNORMAL HIGH (ref 8.0–23.0)
BKR CALCIUM: 10.8 mg/dL — ABNORMAL HIGH (ref 8.8–10.2)
BKR CHLORIDE: 105 mmol/L (ref 98–107)
BKR CO2: 25 mmol/L (ref 20–30)
BKR CREATININE: 0.29 mg/dL — ABNORMAL LOW (ref 0.40–1.30)
BKR EGFR, CREATININE (CKD-EPI 2021): >60 mL/min/{1.73_m2} (ref >=60)
BKR GLOBULIN: 5.1 g/dL — ABNORMAL HIGH (ref 2.3–3.5)
BKR GLUCOSE: 101 mg/dL — ABNORMAL HIGH (ref 70–100)
BKR POTASSIUM: 3.7 mmol/L (ref 3.3–5.3)
BKR PROTEIN TOTAL: 7.9 g/dL (ref 6.6–8.7)
BKR SODIUM: 137 mmol/L (ref 136–144)

## 2022-03-02 NOTE — Plan of Care
Plan of Care Overview/ Patient Status    0700-1900Pt alert unable to assess orientation, pt pleasant and will follow commands.  On RA, lungs diminished.  VSS.  In pain with turning and positioning but comfortable at rest.  Voiding spontaneously, purewick in place good output.  Ileostomy in place +flatus +stool.  Peg tub, dressing changed, bolus feeds given.  One episode of emesis.  Oral care done x2, pt's family came running out approx 10 min after saying pt choking and vomiting.  Pt suctioned and a large amount of dry skin from roof of mouth came out, pt fine after. Dressings changed.  Blood glucose monitored.  Assist x2 in bed.  Bed alarm for safety, will continue to monitor.

## 2022-03-02 NOTE — Plan of Care
Plan of Care Overview/ Patient Status    1900-0700:  Pt A&O to self. VSS on RA. No signs of pain or respiratory distress. Scheduled meds given per order. Ileostomy in place +flatus and stool. PEG tube in place. Bolus TF given 1.5 cartons of Pivot 1.5.  Dressings clean dry and intact. Blood glucose monitoring. Oral care provided.  Podus boots in place. T&Rq2h. Hourly rounding. Pt resting in bed. Safety precautions maintained, bed alarm on, call bell in reach, will ctm and follow nursing poc. Problem: Adult Inpatient Plan of CareGoal: Plan of Care Review3/11/2022 [phone] by Su Grand, RNOutcome: Interventions implemented as appropriateFlowsheets (Taken 03/01/2022 2125)Progress: improvingPlan of Care Reviewed With: patient3/11/2022 9604 by Ernestina Penna Kanishk Stroebel, RNOutcome: Interventions implemented as appropriate

## 2022-03-02 NOTE — Progress Notes
[facility] HealthMedicine Progress NoteAttending Provider: Marlon Pel, MDSubjective: Reason for Admission: Acute encephalopathyInterim History: Patient resting comfortably in bed with no acute complaintsReview of Meds: Scheduled:Current Facility-Administered Medications Medication Dose Route Frequency Provider Last Rate Last Admin ? [Held by provider] apixaban (ELIQUIS) tablet 5 mg  5 mg Per G Tube Q12H Littie Deeds, MD     ? chlorhexidine gluconate (PERIDEX) 0.12 % solution 15 mL  15 mL Mouth/Throat BID Dorisann Frames, MD   15 mL at 03/01/22 2107 ? Elemental iron 60 mg (300 mg Ferrous sulfate) / 5 mL oral liquid 45 mg  45 mg Per G Tube BID Dorisann Frames, MD   45 mg at 03/01/22 2107 ? enoxaparin (LOVENOX) injection 80 mg  80 mg Subcutaneous Q12H Chaar, Harold Barban, MD   80 mg at 03/01/22 2107 ? famotidine (PEPCID) suspension (8 mg/mL) 20 mg  20 mg Per G Tube Daily Dorisann Frames, MD   20 mg at 03/01/22 1610 ? folic acid (FOLVITE) tablet 1 mg  1 mg Per G Tube Daily Dorisann Frames, MD   1 mg at 03/01/22 9604 ? niacin Immediate Release tablet 100 mg  100 mg Per G Tube Nightly Dorisann Frames, MD   100 mg at 03/01/22 2107 ? pyridoxine (vitamin B6) (B-6) tablet 100 mg  100 mg Per G Tube Daily Dorisann Frames, MD   100 mg at 03/01/22 5409 ? [Held by provider] rosuvastatin (CRESTOR) tablet 10 mg  10 mg Per G Tube Nightly Dorisann Frames, MD   10 mg at 02/14/22 2245 ? sodium chloride 0.9 % (new bag) bolus 1,000 mL  1,000 mL Intravenous Once Chaar, Abdelkader, MD     ? sodium chloride 0.9 % flush 3 mL  3 mL IV Push Q8H Dorisann Frames, MD   3 mL at 03/01/22 2107 ? thiamine (VITAMIN B1) tablet 100 mg  100 mg Per G Tube BID Dorisann Frames, MD   100 mg at 03/01/22 2107 PRN: acetaminophen, dextrose (GLUCOSE) oral liquid 15 g **OR** fruit juice **OR** skim milk, dextrose (GLUCOSE) oral liquid 30 g **OR** fruit juice, dextrose injection, dextrose injection, glucagon, sodium chlorideContinuous Infusions: Objective: Vitals:Temp:  [97.3 ?F (36.3 ?C)-99.3 ?F (37.4 ?C)] 99.3 ?F (37.4 ?C)Pulse:  [67-98] 92Resp:  [14-18] 18BP: (101-122)/(67-81) 105/70SpO2:  [98 %-99 %] 98 %Device (Oxygen Therapy): room airI/O:Gross Totals (Last 24 hours) at 03/02/2022 0724Last data filed at 03/02/2022 0530Intake 1911 ml Output 1200 ml Net 711 ml Physical Exam:	Constitutional:  NAD, ill appearing HENT: Normocephalic, atraumatic. No scleral icterus. Neck with normal range of motion.CARD: Normal rate, regular rhythm. Normal heart sounds, intact pulses.PULML:  Effort normal, breath sounds normal. GI: Soft, nontender, nondistended. EXT: No edemaSKIN: Warm and dry. NEURO: Alert and oriented x1, moves upper extremities voluntarilyPSYCH: Mood and affect normal. Labs:Recent Labs Lab 03/06/230646 03/07/230516 WBC 7.3 7.5 HGB 9.4* 9.7* HCT 30.90* 31.10* PLT 322 324  No results for input(s): NEUTROPHILS, LABLYMP, LABEOS, BANDSP in the last 168 hours. Recent Labs Lab 03/07/230516 03/07/230822 03/10/230549 03/10/230857 03/12/230453 NA 138  --  142  --  137 K 3.4  --  3.8  --  3.7 CL 105  --  107  --  105 CO2 25  --  27  --  25 BUN 9  --  15  --  15 CREATININE 0.28*  --  0.30*  --  0.29* GLU 94   < >  100   < > 101* ANIONGAP 8  --  8  --  7  < > = values in this interval not displayed.  Recent Labs Lab 03/07/230516 03/10/230549 03/12/230453 CALCIUM 10.6* 11.5* 10.8*  Recent Labs Lab 03/10/230549 03/12/230453 ALT 17 16 AST 23 19 ALKPHOS 101 91 BILITOT 0.3 0.2  No results for input(s): PTT, LABPROT, INR in the last 168 hours. 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 In the ED pt found to have fevers, admitted by hospitalist service for further care. ID consulted for continued SIRS criteria without source. 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 CSF labs and autoimmune encephalopathy panel negativeNeurology has signed off appreciate recommendations. 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 On review of imaging of the nodules they are improving and less likely representative of carcinomatosis and instead continued resolution of debris from perforation. 	CMP 3x/weekR lateral foot and L medial eminence deep tissue injuriesStableXray feet with degenerative changes but no acute findings	Podiatry following - no dressing requirements, WBAT to BLE 	No need for abx at this time Elevated LFC ratioIron deficiency anemiaHematology signed off - elevated FLC ratio felt to be reactive, plasma cell disorder unlikely Fevers and leukocytosis resolvedNo clear source identified, possibly 2/2 DVT vs autoimmune?S/p zosyn x5d Chronic conditionsRheumatoid Arthritis - has been off of MTX since November/acute illness, rheumatology signing off current ams/weakness not felt to be related to her RA, follow up outpatient Bilateral LE DVT - ?provoked from recent surgery, cont lovenox	# Hospital Issues- F: none- E: replete PRN- N: Tube feeds- GI: bowel regimen- PPx: lovenox - Code Status:Full ACLS# Dispo: Pending clinical improvement, likely LTC, family looking into T19 application Medical decision making for this patient was lowSigned:Greenleigh Kauth MDHospitalist Medicine Attending3/12/20238:55 AM

## 2022-03-03 LAB — BASIC METABOLIC PANEL
BKR ANION GAP: 11 (ref 7–17)
BKR BLOOD UREA NITROGEN: 15 mg/dL (ref 8–23)
BKR BUN / CREAT RATIO: 48.4 — ABNORMAL HIGH (ref 8.0–23.0)
BKR CALCIUM: 11.5 mg/dL — ABNORMAL HIGH (ref 8.8–10.2)
BKR CHLORIDE: 102 mmol/L (ref 98–107)
BKR CO2: 24 mmol/L (ref 20–30)
BKR CREATININE: 0.31 mg/dL — ABNORMAL LOW (ref 0.40–1.30)
BKR EGFR, CREATININE (CKD-EPI 2021): >60 mL/min/{1.73_m2} (ref >=60)
BKR GLUCOSE: 117 mg/dL — ABNORMAL HIGH (ref 70–100)
BKR POTASSIUM: 3.4 mmol/L (ref 3.3–5.3)
BKR SODIUM: 137 mmol/L (ref 136–144)

## 2022-03-03 LAB — CBC WITHOUT DIFFERENTIAL
BKR WAM ANALYZER ANC: 2.71 x 1000/ÂµL (ref 2.00–7.60)
BKR WAM HEMATOCRIT (2 DEC): 37.7 % (ref 35.00–45.00)
BKR WAM HEMOGLOBIN: 11.1 g/dL — ABNORMAL LOW (ref 11.7–15.5)
BKR WAM MCH (PG): 26.9 pg — ABNORMAL LOW (ref 27.0–33.0)
BKR WAM MCHC: 29.4 g/dL — ABNORMAL LOW (ref 31.0–36.0)
BKR WAM MCV: 91.3 fL (ref 80.0–100.0)
BKR WAM MPV: 10.5 fL (ref 8.0–12.0)
BKR WAM PLATELETS: 385 x1000/ÂµL (ref 150–420)
BKR WAM RDW-CV: 18.5 % — ABNORMAL HIGH (ref 11.0–15.0)
BKR WAM RED BLOOD CELL COUNT.: 4.13 M/ÂµL (ref 4.00–6.00)
BKR WAM WHITE BLOOD CELL COUNT: 5.3 x1000/ÂµL (ref 4.0–11.0)

## 2022-03-03 LAB — COMPREHENSIVE METABOLIC PANEL
BKR A/G RATIO: 0.6 — ABNORMAL LOW (ref 1.0–2.2)
BKR ALANINE AMINOTRANSFERASE (ALT): 21 U/L (ref 10–35)
BKR ALBUMIN: 3.3 g/dL — ABNORMAL LOW (ref 3.6–4.9)
BKR ALKALINE PHOSPHATASE: 99 U/L (ref 9–122)
BKR ANION GAP: 11 (ref 7–17)
BKR ASPARTATE AMINOTRANSFERASE (AST): 26 U/L (ref 10–35)
BKR AST/ALT RATIO: 1.2
BKR BILIRUBIN TOTAL: 0.2 mg/dL (ref <=1.2)
BKR BLOOD UREA NITROGEN: 15 mg/dL (ref 8–23)
BKR BUN / CREAT RATIO: 48.4 — ABNORMAL HIGH (ref 8.0–23.0)
BKR CALCIUM: 11.5 mg/dL — ABNORMAL HIGH (ref 8.8–10.2)
BKR CHLORIDE: 102 mmol/L (ref 98–107)
BKR CO2: 24 mmol/L (ref 20–30)
BKR CREATININE: 0.31 mg/dL — ABNORMAL LOW (ref 0.40–1.30)
BKR EGFR, CREATININE (CKD-EPI 2021): >60 mL/min/{1.73_m2} (ref >=60)
BKR GLOBULIN: 5.7 g/dL — ABNORMAL HIGH (ref 2.3–3.5)
BKR GLUCOSE: 117 mg/dL — ABNORMAL HIGH (ref 70–100)
BKR POTASSIUM: 3.4 mmol/L (ref 3.3–5.3)
BKR PROTEIN TOTAL: 9 g/dL — ABNORMAL HIGH (ref 6.6–8.7)
BKR SODIUM: 137 mmol/L (ref 136–144)

## 2022-03-03 MED ORDER — FLUCONAZOLE 50 MG TABLET
50 mg | Freq: Once | ORAL | Status: CP
Start: 2022-03-03 — End: ?
  Administered 2022-03-03: 23:00:00 50 mg via ORAL

## 2022-03-03 NOTE — Plan of Care
Inpatient Physical Therapy Re-Evaluation IP Adult PT Eval/Treat - 03/03/22 1323    Date of Visit / Treatment  Date of Visit / Treatment 03/03/22   Note Type Re-Evaluation   Progress Report Due 03/31/22   End Time 1323    General Information  Pertinent History Of Current Problem per chart [name] is a 66yo female with a history of Rheumatoid arthritis and recent prolonged hospitalization for perforated diverticulitis (s/p ex Lap/R hemicolectomy with end ileostomy) who presented for LTAc placement and persistent encephalopathy. In the ED pt found to have fevers, admitted by hospitalist service for further care. ID consulted for continued SIRS criteria without source. GI consulted for concerning imaging findings, Gen surgery consulted for G tube leaking    Subjective mostly nonverbal, agreeable to limited evaluation   General Observations supine in bed, session cleared by RN prior   Precautions/Limitations Fall Precautions;Bed alarm   Precautions/Limitations Comment WBAT B LE    Weight Bearing Status  Weight Bearing Status Comments WBAT B LE    Prior Level of Functioning/Social History  Additional Comments unclear PLOF, pt mostly nonverbal    Vital Signs and Orthostatic Vital Signs  Vital Signs Vital Signs Stable   Vital Signs Free text per chart review    Pain/Comfort  Pain Comment (Pre/Post Treatment Pain) pain noted in B LE with mobility, did not quantify, RN aware, positioned to comfort post    Patient Coping  Observed Emotional State withdrawn    Cognition  Overall Cognitive Status Impaired   Following Commands Follows one step commands with increased time;Follows one step commands with repetition    Vision/ Hearing  Hearing difficulties / Use of hearing aids intact    Range of Motion  Range of Motion Examination bilateral upper extremity ROM was WFL;bilateral lower extremity ROM was [facility]   Range of Motion Comments unable to assess full ROM, pt reports pain with PROM, pt has at least 50% of PROM    Manual Muscle Testing  Manual Muscle Testing Comments +deconditioned    Musculoskeletal  LUE Muscle Strength Grading 2-->active movement with gravity eliminated   RUE Muscle Strength Grading 2-->active movement with gravity eliminated   LLE Muscle Strength Grading 2-->active movement with gravity eliminated   RLE Muscle Strength Grading 2-->active movement with gravity eliminated    Muscle Tone  Muscle Tone Testing Results No muscle tone deficits noted    Coordination  Coordination Comments grossly impaired    Peripheral Neurovascular  Edema  none present    Sensory Assessment  Sensory Tests Results No sensory impairment noted    Skin Assessment  Skin Assessment See Nursing Documentation    Balance  Balance Skills Training Comment bed level only secondary to pain with mobility    Bed Mobility  Bed Mobility Comments pt deferred, only agreeable to bed level PROM    Handoff Documentation  Handoff Patient in bed;Bed alarm;Patient instructed to call nursing for mobility;Discussed with nursing    Endurance  Endurance Comments poor    PT- AM-PAC - Basic Mobility Screen- How much help from another person do you currently need.....  Turning from your back to your side while in a a flat bed without using rails? 1 - Total - Requires total assistance or cannot do it at all.   Moving from lying on your back to sitting on the side of a flat bed without using bed rails? 1 - Total - Requires total assistance or cannot do it at all.   Moving to and  from a bed to a chair (including a wheelchair)? 1 - Total - Requires total assistance or cannot do it at all.   Standing up from a chair using your arms(e.g., wheelchair or bedside chair)? 1 - Total - Requires total assistance or cannot do it at all.   To walk in a hospital room? 1 - Total - Requires total assistance or cannot do it at all.   Climbing 3-5 steps with a railing? 1 - Total - Requires total assistance or cannot do it at all.   AMPAC Mobility Score 6   TARGET Highest Level of Mobility Mobility Level 2, Turn self in bed/bed activity/dependent transfer     Therapeutic Exercise  Therapeutic Exercise Comments B UE and LE PROM to tolerance    Therapeutic Functional Activity  Therapeutic Functional Activity Comments repositioned to comfort    Clinical Impression  Follow up Assessment Pt seen for re-evaluation at this time.  Pt agreeable to bed level evaluation only and displays deficits in strength, bed mobility, transfers, inability to ambulate, and reduced activity tolerance.  Recommend DC to STR when medically cleared.    Patient/Family Stated Goals  Patient/Family Stated Goal(s) unable to state    Frequency/Equipment Recommendations  PT Frequency 3x per week   What day of week is next treatment expected? Tuesday   3/14   PT Recommendations for Inpatient Admission  Activity/Level of Assist mechanical lift;assist of 2   ADL Recommendations assist of 2   bed level   Planned Treatment / Interventions  Plan for Next Visit progress as tolerated    PT Discharge Summary  Physical Therapy Disposition Recommendation Short Term Rehab     Problem: Physical Therapy GoalsGoal: Physical Therapy GoalsDescription: PT GOALS - addressed 3/13/231. Patient will demonstrate active range of motion x 4 extremities in preparation for mobility2. Caregivers will be independent and safely implement a range of motion/positioning program to prevent loss of range of motion and skin integrity3. Patient will perform bed mobility with maximal assist4. Patient will tolerate sitting edge of bed >5 minutes with minimal assist5. Patient will perform transfers with moderate assist of 2 using rolling walker  Outcome: Revised

## 2022-03-03 NOTE — Plan of Care
Plan of Care Overview/ Patient Status    1900-0700Patient alert and oriented to self. Patient calm and cooperative with care. VSS on RA. Patient denies having any chest pain. Lung sounds diminished. Abdomen soft and non tender. All scheduled medication given as ordered, meds via PEG . Bolus feed ( pivot 1.5) tolerated well and q4h flushed given as order. PEG site red with yellow drainage, area cleansed w/ NS and new dressing applied. FS monitored. Ileostomy with good output, appliance CDI. Oral care provided. Dressing to BLE CDI, changed on previous shift. Podus boots in place. Patient turned and reposition q2h. No issues overnight, Will continue to monitor and follow POC. Please see flow sheet and MAR for more details.

## 2022-03-04 LAB — CBC WITH AUTO DIFFERENTIAL
BKR WAM ABSOLUTE IMMATURE GRANULOCYTES.: 0.02 x 1000/ÂµL (ref 0.00–0.30)
BKR WAM ABSOLUTE LYMPHOCYTE COUNT.: 2.32 x 1000/ÂµL (ref 0.60–3.70)
BKR WAM ABSOLUTE NRBC (2 DEC): 0 x 1000/ÂµL (ref 0.00–1.00)
BKR WAM ANALYZER ANC: 3.25 x 1000/ÂµL (ref 2.00–7.60)
BKR WAM BASOPHIL ABSOLUTE COUNT.: 0.04 x 1000/ÂµL (ref 0.00–1.00)
BKR WAM BASOPHILS: 0.6 % (ref 0.0–1.4)
BKR WAM EOSINOPHIL ABSOLUTE COUNT.: 0.34 x 1000/ÂµL (ref 0.00–1.00)
BKR WAM EOSINOPHILS: 5 % (ref 0.0–5.0)
BKR WAM HEMATOCRIT (2 DEC): 31.6 % — ABNORMAL LOW (ref 35.00–45.00)
BKR WAM HEMOGLOBIN: 9.8 g/dL — ABNORMAL LOW (ref 11.7–15.5)
BKR WAM IMMATURE GRANULOCYTES: 0.3 % (ref 0.0–1.0)
BKR WAM LYMPHOCYTES: 34.1 % (ref 17.0–50.0)
BKR WAM MCH (PG): 26.8 pg — ABNORMAL LOW (ref 27.0–33.0)
BKR WAM MCHC: 31 g/dL (ref 31.0–36.0)
BKR WAM MCV: 86.6 fL (ref 80.0–100.0)
BKR WAM MONOCYTE ABSOLUTE COUNT.: 0.84 x 1000/ÂµL (ref 0.00–1.00)
BKR WAM MONOCYTES: 12.3 % — ABNORMAL HIGH (ref 4.0–12.0)
BKR WAM MPV: 10.3 fL (ref 8.0–12.0)
BKR WAM NEUTROPHILS: 47.7 % (ref 39.0–72.0)
BKR WAM NUCLEATED RED BLOOD CELLS: 0 % (ref 0.0–1.0)
BKR WAM PLATELETS: 335 x1000/ÂµL (ref 150–420)
BKR WAM RDW-CV: 18.4 % — ABNORMAL HIGH (ref 11.0–15.0)
BKR WAM RED BLOOD CELL COUNT.: 3.65 M/ÂµL — ABNORMAL LOW (ref 4.00–6.00)
BKR WAM WHITE BLOOD CELL COUNT: 6.8 x1000/ÂµL (ref 4.0–11.0)

## 2022-03-04 LAB — BASIC METABOLIC PANEL
BKR ANION GAP: 8 (ref 7–17)
BKR BLOOD UREA NITROGEN: 15 mg/dL (ref 8–23)
BKR BUN / CREAT RATIO: 53.6 — ABNORMAL HIGH (ref 8.0–23.0)
BKR CALCIUM: 11.8 mg/dL — ABNORMAL HIGH (ref 8.8–10.2)
BKR CHLORIDE: 104 mmol/L (ref 98–107)
BKR CO2: 25 mmol/L (ref 20–30)
BKR CREATININE: 0.28 mg/dL — ABNORMAL LOW (ref 0.40–1.30)
BKR EGFR, CREATININE (CKD-EPI 2021): >60 mL/min/{1.73_m2} (ref >=60)
BKR GLUCOSE: 105 mg/dL — ABNORMAL HIGH (ref 70–100)
BKR POTASSIUM: 3.9 mmol/L (ref 3.3–5.3)
BKR SODIUM: 137 mmol/L (ref 136–144)

## 2022-03-04 MED ORDER — SODIUM CHLORIDE 0.9 % INTRAVENOUS SOLUTION
INTRAVENOUS | Status: DC
Start: 2022-03-04 — End: 2022-03-13
  Administered 2022-03-04 – 2022-03-13 (×18): via INTRAVENOUS

## 2022-03-04 NOTE — Progress Notes
[facility] HospitalPod[facility] Surgery	Podiatry Progress NoteDate: 3/13/2023Patient Name: Danielle MaloneMRN: [id number] Attending Provider: Janann Colonel, MDCC: Bilateral foot woundsSubjective: Pt seen at bedside this AM in NAD with attending. NAEO. Limited history. PMH: PSH: No past medical history on file. No past surgical history on file. Social History: Family History: Social History Tobacco Use ? Smoking status: Not on file ? Smokeless tobacco: Not on file Substance Use Topics ? Alcohol use: Not on file  No family history on file. Allergies: Patient has no known allergies.Review of Systems: Unable to assess 2/2 AMSVitals: Vitals:  03/03/22 0733 BP: 113/77 Pulse: (!) 100 Resp: 20 Temp: 97.3 ?F (36.3 ?C) Physical Exam: Gen: NADHead: NormocephalicPsych: AAO x 2Chest: CTAB, no r/r/wCardio: RRR, S1, S2, no m/r/gAbd: Soft NT, ND, +bsVascular: DP/PT pulses palpable. Normal CRT to all digits. Foot is warm to touch. Pedal hair growth is absentNeuro: Unable to assess MSK: Unable to ROM digits when asked, unable to participate in MMTDerm: Deep tissue injury noted to the Right lateral foot without any open lesions.  Previous Superficial deep tissue injury at the right medial eminence has completely healed.Labs: Lab Results Component Value Date  WBC 5.3 03/03/2022  HGB 11.1 (L) 03/03/2022  HCT 37.70 03/03/2022  CREATININE 0.31 (L) 03/03/2022  CREATININE 0.31 (L) 03/03/2022  BUN 15 03/03/2022  BUN 15 03/03/2022  CO2 24 03/03/2022  CO2 24 03/03/2022  PLT 385 03/03/2022  INR 1.10 02/18/2022  GLU 117 (H) 03/03/2022  GLU 117 (H) 03/03/2022  K 3.4 03/03/2022  K 3.4 03/03/2022  NA 137 03/03/2022  NA 137 03/03/2022  CL 102 03/03/2022  CL 102 03/03/2022  ALT 21 03/03/2022  AST 26 03/03/2022  TSH 1.670 02/17/2022  HSCRP 10.7 (H) 02/19/2022  SEDRATE 98 (H) 02/19/2022 Imaging: XR Foot Bilateral AP Lateral and ObliqueResult Date: 3/7/2023AP, LATERAL AND OBLIQUE BILATERAL FOOT X-RAY Clinical Information: Baseline assessment. Assess for erosions. Comparison: None Findings: Left foot: The overall bony mineralization appears qualitatively diminished. There is evidence of joint space narrowing indicative of arthropathy involving the interphalangeal joint and metatarsal phalangeal joint of the first digit as well as the DIP and PIP joints of the remaining digits. There is mild hallux valgus as well as bunion formation. The moderate degenerative changes are noted in the midfoot. There is no acute osseous injury. Right foot: The overall bony mineralization appears qualitatively diminished. There is evidence of joint space narrowing indicative of arthropathy involving the interphalangeal joint and metatarsal phalangeal joint of the first digit as well as the DIP and PIP joints of the remaining digits. There is moderate to severe hallux valgus as well as bunion formation. The moderate degenerative changes are noted in the midfoot. There is no acute osseous injury. 1. Diffuse degenerative changes without evidence of acute osseous injury. There is no evidence of bony erosion. Reported And Signed By: Merdis Delay, MD  [facility]ical [facility] Micro/Path: None Assessment/Plan: Danielle [name] is a 66 y.o. female with Right lateral foot and Left medial eminence deep tissue injuries, stable. No open wounds or SOI. ?- Recommend b/l foot Xrays for baseline assessment and r/o underlying erosions - no dressing needed on the left medial eminence, would recommend a Betadine with overlying Allevyn pad to the right DTI- Recommend WBAT to BLE- Antibiotics per primary team - none indicated per podiatry standpointDispo: No indication for podiatric surgical intervention at this time.  Patient may follow-up with Dr. Perrie Ragin within 3-4 weeks of discharge.  Podiatry will continue to follow peripherally while in house. Please contact the  resident on call with any acute changes, new issues, or questions.Seen with attending, Dr. Darral Dash page on-call podiatry resident with questions:?	SEE AMION FOR CALL SCHEDULE Donne Hazel, DPM PGY-2 [facility] Podiatric SurgeryElectronically Signed by Donne Hazel, DPM, March 13, 2023I saw and evaluated Danielle [name] with the resident. I agree with the findings and the plan of care as documented in the resident note.Electronically Signed by Donn Pierini, DPM, March 03, 2022

## 2022-03-04 NOTE — Plan of Care
Inpatient Physical Therapy Progress Note IP Adult PT Eval/Treat - 03/04/22 1150    Date of Visit / Treatment  Date of Visit / Treatment 03/04/22   Progress Report Due 03/31/22   End Time 1150    General Information  Subjective I've been ok.   General Observations Supine in bed, agreeable. Cleared by RN for PT session.   Precautions/Limitations Fall Precautions;Bed alarm   Precautions/Limitations Comment WBAT BLE    Weight Bearing Status  Weight Bearing Status Comments WBAT BLE    Vital Signs and Orthostatic Vital Signs  Vital Signs Vital Signs Stable   Vital Signs Free text RA, NAD    Pain/Comfort  Pain Comment (Pre/Post Treatment Pain) Pain noted in R knee with movement, unable to qunatify. RN aware.    Patient Coping  Diversional Activities television    Cognition  Overall Cognitive Status Impaired   Orientation Level Oriented to person   Level of Consciousness alert   Following Commands Follows one step commands with increased time;Follows one step commands with repetition   Personal Safety / Judgment Fall risk    Musculoskeletal  LUE Muscle Strength Grading 2-->active movement with gravity eliminated   RUE Muscle Strength Grading 2-->active movement with gravity eliminated   LLE Muscle Strength Grading 2-->active movement with gravity eliminated   RLE Muscle Strength Grading 2-->active movement with gravity eliminated    Skin Assessment  Skin Assessment See Nursing Documentation    Balance  Balance Skills Training Comment Bed level only this session 2/2 pain with mobility.    Bed Mobility  Rolling/Turning Right - Independence/Assistance Level Maximum assist   Rolling/Turning Left - Independence/Assistance Level Maximum assist   Rolling/Turning Assist Device Bed rails;Hand held assist   Bed Mobility Comments Ax2 to reposition in bed for comfort    Handoff Documentation  Handoff Patient in bed;Bed alarm;Patient instructed to call nursing for mobility;Discussed with nursing    Endurance  Endurance Comments Poor    PT- AM-PAC - Basic Mobility Screen- How much help from another person do you currently need.....  Turning from your back to your side while in a a flat bed without using rails? 1 - Total - Requires total assistance or cannot do it at all.   Moving from lying on your back to sitting on the side of a flat bed without using bed rails? 1 - Total - Requires total assistance or cannot do it at all.   Moving to and from a bed to a chair (including a wheelchair)? 1 - Total - Requires total assistance or cannot do it at all.   Standing up from a chair using your arms(e.g., wheelchair or bedside chair)? 1 - Total - Requires total assistance or cannot do it at all.   To walk in a hospital room? 1 - Total - Requires total assistance or cannot do it at all.   Climbing 3-5 steps with a railing? 1 - Total - Requires total assistance or cannot do it at all.   AMPAC Mobility Score 6   TARGET Highest Level of Mobility Mobility Level 2, Turn self in bed/bed activity/dependent transfer    ACTUAL Highest Level of Mobility Mobility Level 2, Turn self in bed/bed activity/ dependent transfer    Therapeutic Exercise  Therapeutic Exercise Comments BUE and BLE AAROM/PROM therex as tolerated    Therapeutic Functional Activity  Therapeutic Functional Activity Comments repositioned for comfort    Clinical Impression  Follow up Assessment Pt seen for PT follow up session. Ax2 to  reposition for comfort. Limited greatly by LE pain with movement. Tolerated PROM/AAROM fairly. Pt continues to demonstrate decreased strength, ROM, balance and activity tolerance. Will benefit from continued skilled PT to improve functional mobility.    Patient/Family Stated Goals  Patient/Family Stated Goal(s) unable to state    Frequency/Equipment Recommendations  PT Frequency 3x per week   What day of week is next treatment expected? Friday   3/17  PT/PTA completing this assessment Patsey Berthold    PT Recommendations for Inpatient Admission  Activity/Level of Assist mechanical lift;assist of 2    Planned Treatment / Interventions  Plan for Next Visit Progress as tolerated    PT Discharge Summary  Physical Therapy Disposition Recommendation Short Term Rehab     Lemmie Evens, [place]: [phone]

## 2022-03-04 NOTE — Other
[facility]	 [facility]	Endocrine Initial Consult Note Consult Information: Endocrine Consultation requested by: Janann Colonel, MDReason for consultation: HypercalcemiaSource of Information: Chart review; history is limited by the condition of the patientPresentation History: Danielle [name] is a 66 year old woman with a PMH of RA on methotrexate. 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 She had continued encephalopathy ultimately requiring PEG placement. After discharge on 02/05/22, her sister brought her to [place]. She was admitted to [facility] on 02/11/22. Endocrine was consulted due to ongoing hypercalcemia. Medical History: Medical History: She has no past medical history on file. Surgical History: She has no past surgical history on file.Family History: Shefamily history is not on file.Social History: She has no history on file for tobacco use, alcohol use, and drug use.Inpatient Medications:I have reviewed the patient's current medication orders and pertinent home medications. Review of Systems: 12 point ROS completed and negative except as mentioned aboveEndocrine Physical Exam: Vitals:Vitals:  03/03/22 0733 03/03/22 1628 03/03/22 2357 03/04/22 0844 BP: 113/77 (!) 142/85 (!) 143/84 122/85 Pulse: (!) 100 (!) 107 (!) 112 (!) 103 Resp: 20 18 20 16  Temp: 97.3 ?F (36.3 ?C) 97.5 ?F (36.4 ?C) 98.7 ?F (37.1 ?C) 98.2 ?F (36.8 ?C) TempSrc: Axillary Oral Oral Axillary SpO2: 98% 97% 98% 96% Weight:     Height:     Physical Exam:Gen: NADNeck: No JVD, thyroid normalCAD: RRR, no murmurs, rubs, or gallupsLung: CTABAbd:  soft, NT, BS+EXT: no peripheral edema bilaterallyNeuro: CN II - XII grossly intact, AAO x 1Psych: appropriate affectSkin: no visible bruises or striaeReview of Labs/Diagnostics: Lab Review:I have reviewed the patient's pertinent and recent labs. Significant findings are below.Lab Results Component Value Date  CREATININE 0.28 (L) 03/04/2022  CALCIUM 11.8 (H) 03/04/2022  PHOS 2.2 02/19/2022  ALBUMIN 3.3 (L) 03/03/2022  PTH 28.0 02/21/2022  VITAMIND25HY 48 02/21/2022  VITD125DH 43 02/21/2022 Corrected calcium 12.4 today2/27/23 TSH: 1.67Diagnostic Review:I have reviewed the patient's pertinent and recent radiology report(s).  Impression: 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 She had continued encephalopathy ultimately requiring PEG placement. After discharge on 02/05/22, her sister brought her to [place]. She was admitted to [facility] on 02/11/22. Endocrine was consulted due to ongoing hypercalcemia. Although her PTH is not completely suppressed, it is not the driver of her hypercalcemia. The DDx for non-PTH mediated hypercalcemia includes granulomatous disease, exogenous intake of calcium/vitamin D, liver disease, malignancy, hyperthyroidism, immobilization, etc. Given that her diet has been controlled inpatient for months, excessive supplementation is less likely, confirmed by her normal vitamin D level. Her normal vitamin D 1,25 + chest imaging without evidence of granulomatous disease makes granulomatous disease unlikely. Her normal TSH and LFTs make hyperthyroidism and liver disease unlikely causes. 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 Hypercalcemia driven by immobility is possible, but her age makes this less likely to be the only cause. Given her calcium of 12.4, she should be treated with aggressive hydration now. If her parathyroid hormone (mineral metabolism) is normal, we will consider Zometa.  Recommendations: - check parathyroid hormone (mineral metabolism) lab - start normal saline 150 to 200 cc/hr if patient can tolerate it- daily calcium + albumin for now- avoid dehydration, thiazides, lithium, high calcium diet (> 1000 mg daily), and prolonged inactivityPlease refer to attending's addendum for final recommendations.  Danielle [name], MDEndocrine FellowMHB: [phone]

## 2022-03-04 NOTE — Plan of Care
Plan of Care Overview/ Patient Status    0730-1900: Alert to self only. Calm/cooperative. Soft spoken. VSS on room air. C/o new vaginal pain/itching- PA rice,b. Aware, diflucan ordered & administered. Lungs dim. NPO per order, oral care provided. Bolus tube feeding & flushes administered via G-tube per order, tolerating well. Incontinent large amt urine, care provided. Ileostomy RLQ ostomy bag intact +liquid stool & flatus output. Midline abd mucous fistula ostomy bag intact, no output this shift. Turned & repositioned in bed q2 hours. Specialty bed in place. Heels offloaded w/ boots bilat. Barrier cream applied to coccyx. See flowsheets for skin assessmentProblem: Adult Inpatient Plan of CareGoal: Plan of Care ReviewOutcome: Interventions implemented as appropriate Problem: Adult Inpatient Plan of CareGoal: Patient-Specific Goal (Individualized)Outcome: Interventions implemented as appropriate

## 2022-03-04 NOTE — Progress Notes
[facility] HealthMedicine Progress NoteAttending Provider: Janann Colonel, MDSubjective: Reason for Admission: Acute encephalopathyInterim History: Patient resting comfortably in bedReports some vaginal pain and itching; not worse with urinatingOtherwise feeling ok todayReview of Meds: Scheduled:Current Facility-Administered Medications Medication Dose Route Frequency Provider Last Rate Last Admin ? [Held by provider] apixaban (ELIQUIS) tablet 5 mg  5 mg Per G Tube Q12H Littie Deeds, MD     ? chlorhexidine gluconate (PERIDEX) 0.12 % solution 15 mL  15 mL Mouth/Throat BID Dorisann Frames, MD   15 mL at 03/03/22 2116 ? Elemental iron 60 mg (300 mg Ferrous sulfate) / 5 mL oral liquid 45 mg  45 mg Per G Tube BID Dorisann Frames, MD   45 mg at 03/03/22 2116 ? enoxaparin (LOVENOX) injection 80 mg  80 mg Subcutaneous Q12H Chaar, Abdelkader, MD   80 mg at 03/03/22 2116 ? famotidine (PEPCID) suspension (8 mg/mL) 20 mg  20 mg Per G Tube Daily Dorisann Frames, MD   20 mg at 03/03/22 4782 ? folic acid (FOLVITE) tablet 1 mg  1 mg Per G Tube Daily Dorisann Frames, MD   1 mg at 03/03/22 9562 ? niacin Immediate Release tablet 100 mg  100 mg Per G Tube Nightly Dorisann Frames, MD   100 mg at 03/03/22 2116 ? pyridoxine (vitamin B6) (B-6) tablet 100 mg  100 mg Per G Tube Daily Dorisann Frames, MD   100 mg at 03/03/22 0817 ? [Held by provider] rosuvastatin (CRESTOR) tablet 10 mg  10 mg Per G Tube Nightly Dorisann Frames, MD   10 mg at 02/14/22 2245 ? sodium chloride 0.9 % (new bag) bolus 1,000 mL  1,000 mL Intravenous Once Chaar, Abdelkader, MD     ? sodium chloride 0.9 % flush 3 mL  3 mL IV Push Q8H Dorisann Frames, MD   3 mL at 03/03/22 2117 ? thiamine (VITAMIN B1) tablet 100 mg  100 mg Per G Tube BID Dorisann Frames, MD   100 mg at 03/03/22 2116 PRN: acetaminophen, dextrose (GLUCOSE) oral liquid 15 g **OR** fruit juice **OR** skim milk, dextrose (GLUCOSE) oral liquid 30 g **OR** fruit juice, dextrose injection, dextrose injection, glucagon, sodium chlorideContinuous Infusions: Objective: Vitals:Temp:  [97.3 ?F (36.3 ?C)-97.5 ?F (36.4 ?C)] 97.5 ?F (36.4 ?C)Pulse:  [100-107] 107Resp:  [18-20] 18BP: (113-142)/(77-85) 142/85SpO2:  [97 %-98 %] 97 %Device (Oxygen Therapy): room airI/O:Gross Totals (Last 24 hours) at 03/03/2022 2148Last data filed at 03/03/2022 1900Intake 2711 ml Output 400 ml Net 2311 ml Physical Exam:	Constitutional:  NAD, ill appearing HENT: Normocephalic, atraumatic. No scleral icterus. Neck with normal range of motion.CARD: Normal rate, regular rhythm. Normal heart sounds, intact pulses.PULML:  Effort normal, breath sounds normal. GI: Soft, nontender, nondistended. EXT: No edemaSKIN: Warm and dry. NEURO: Alert and oriented x1 (states she's in a supermarket), moves upper extremities voluntarilyLabs:Recent Labs Lab 03/07/230516 03/13/230440 WBC 7.5 5.3 HGB 9.7* 11.1* HCT 31.10* 37.70 PLT 324 385  No results for input(s): NEUTROPHILS, LABLYMP, LABEOS, BANDSP in the last 168 hours. Recent Labs Lab 03/10/230549 03/10/230857 03/12/230453 03/12/230838 03/13/230440 03/13/230851 03/13/231746 NA 142  --  137  --  137 - 137  --   --  K 3.8  --  3.7  --  3.4 - 3.4  --   --  CL 107  --  105  --  102 - 102  --   --  CO2 27  --  25  --  24 - 24  --   --  BUN 15  --  15  --  15 - 15  --   --  CREATININE 0.30*  --  0.29*  --  0.31* - 0.31*  --   --  GLU 100   < > 101*   < > 117* - 117*   < > 112* ANIONGAP 8  --  7  --  11 - 11  --   --   < > = values in this interval not displayed.  Recent Labs Lab 03/10/230549 03/12/230453 03/13/230440 CALCIUM 11.5* 10.8* 11.5* - 11.5*  Recent Labs Lab 03/10/230549 03/12/230453 03/13/230440 ALT 17 16 21  AST 23 19 26  ALKPHOS 101 91 99 BILITOT 0.3 0.2 0.2  No results for input(s): PTT, LABPROT, INR in the last 168 hours. 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 In the ED pt found to have fevers, admitted by hospitalist service for further care. ID consulted for continued SIRS criteria without source. GI consulted for concerning imaging findings, Gen surgery consulted for G tube leaking. Now stable.Plan: Acute encephalopathyLE weaknessUnclear etiology, insetting of prolonged hospitalization from November of 2022 to Feb 2023S/p LP - unremarkable so farMRI brain - microvascular disease with no new findings, volume loss enlarged lateral and third ventricles can be seen with NPH MRI total spine - scoliotic deformity of thoracolumbar spine, moderate degenerative spondylosis of cervical and lumbar spine, no acute infectious pathology noted. CSF labs and autoimmune encephalopathy panel negativeNeurology has signed off appreciate recommendations. 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 On review of imaging of the nodules they are improving and less likely representative of carcinomatosis and instead continued resolution of debris from perforation. 	CMP 3x/weekR lateral foot and L medial eminence deep tissue injuriesStableXray feet with degenerative changes but no acute findings	Podiatry following - no dressing requirements, WBAT to BLE 	No need for abx at this time Elevated LFC ratioIron deficiency anemiaHematology signed off - elevated FLC ratio felt to be reactive, plasma cell disorder unlikely Fevers and leukocytosis resolvedNo clear source identified, possibly 2/2 DVT vs autoimmune?S/p zosyn x5d Chronic conditionsRheumatoid Arthritis - has been off of MTX since November/acute illness, rheumatology signing off current ams/weakness not felt to be related to her RA, follow up outpatient Bilateral LE DVT - ?provoked from recent surgery, cont lovenox	# Hospital Issues- F: none- E: replete PRN- N: Tube feeds- GI: bowel regimen- PPx: lovenox - Code Status:Full  ACLS# Dispo: Pending clinical improvement, likely LTC, family looking into T19 application Medical decision making for this patient was lowSigned:Sirinity Outland Dimple Casey, MDHospitalist Medicine Attending3/13/2023

## 2022-03-04 NOTE — Plan of Care
Plan of Care Overview/ Patient Status    1900-0700Patient alert and oriented to self. Patient calm and cooperative with care. VSS on RA.  Lung sounds diminished. Abdomen soft and non tender. All scheduled medication given as ordered, meds via PEG . PRN tylenol given for generalized pain. Bolus feed ( pivot 1.5) tolerated well and q4h flushed given as order. PEG site red with yellow drainage, area cleansed w/ NS and new dressing applied. FS monitored. Ileostomy with good output, appliance CDI. Oral care provided. Dressing to BLE CDI, changed is shift. Podus boots in place. Patient turned and reposition q2h. No issues overnight, Will continue to monitor and follow POC. Please see flow sheet and MAR for more details.

## 2022-03-04 NOTE — Plan of Care
Plan of Care Overview/ Patient Status    0700-1900Patient is A&Ox2. Disoriented to time and situation. VSS on RA. Pt reported lower back pain, PRN Tylenol given with +effect. Covering provider Rice, Nehemiah Settle  was notified and came to bedside. Incontinent of bladder. Ileostomy +stool/flatus. NPO. PEG in place and dressing to site changed. Bolus TF Pivot 1.5 as ordered with Q6hr FWF . Medications given crushed via PEG. PIV placed this shift and IVF initiated .Q2hr T&R performed. Safety maintained, hourly rounding performed, call bell within reach. Bed alarm on. Will continue to follow POC.

## 2022-03-04 NOTE — Plan of Care
Plan of Care Overview/ Patient Status    Problem: Adult Inpatient Plan of CareGoal: Readiness for Transition of CareOutcome: Interventions implemented as appropriate Pt discussed during TC rounds, not medically ready at this time due to elevated calcium levels. Endocrinology consult placed. [name] (sister) continues to work on obtaining conservatorship and T-19 application completion. She has requested necessary information from pt's previous physicians & banks in [place]. Elinor Parkinson, RN, MSNCare ManagerMHB: [phone]

## 2022-03-05 LAB — PTH, INTACT     (BH GH LMW YH): BKR PARATHYROID HORMONE INTACT: 36 pg/mL (ref 15.0–65.0)

## 2022-03-05 LAB — RENAL FUNCTION PANEL
BKR ALBUMIN: 2.6 g/dL — ABNORMAL LOW (ref 3.6–4.9)
BKR ANION GAP: 8 (ref 7–17)
BKR BLOOD UREA NITROGEN: 13 mg/dL (ref 8–23)
BKR BUN / CREAT RATIO: 59.1 — ABNORMAL HIGH (ref 8.0–23.0)
BKR CALCIUM: 10.5 mg/dL — ABNORMAL HIGH (ref 8.8–10.2)
BKR CHLORIDE: 107 mmol/L (ref 98–107)
BKR CO2: 21 mmol/L (ref 20–30)
BKR CREATININE: 0.22 mg/dL — ABNORMAL LOW (ref 0.40–1.30)
BKR EGFR, CREATININE (CKD-EPI 2021): >60 mL/min/{1.73_m2} (ref >=60)
BKR GLUCOSE: 103 mg/dL — ABNORMAL HIGH (ref 70–100)
BKR PHOSPHORUS: 2.4 mg/dL (ref 2.2–4.5)
BKR POTASSIUM: 3.8 mmol/L (ref 3.3–5.3)
BKR SODIUM: 136 mmol/L (ref 136–144)

## 2022-03-05 NOTE — Progress Notes
Daily Progress NoteHospital Medicine ServiceAttending Provider: Janann Colonel, MD [phone] Day #22Patient seen and examined on 03/05/2022.CC: Fever, unspecified fever cause24-hr Events: - Consulted Endocrine for persistent hypercalcemia; started NS @ 150cc/hr Subjective: - Tells me she overall feels unwell but no specific complaints; denies pain or difficulty breathingObjective: Vitals:Temp:  [98.2 ?F (36.8 ?C)-98.7 ?F (37.1 ?C)] 98.2 ?F (36.8 ?C)Pulse:  [90-113] 96Resp:  [18-20] 20BP: (105-131)/(64-81) 105/64SpO2:  [97 %-98 %] 97 %Device (Oxygen Therapy): room airI/O's:Intake/Output Summary (Last 24 hours) at 03/05/2022 1849Last data filed at 03/05/2022 1700Gross per 24 hour Intake 3220 ml Output 510 ml Net 2710 ml Physical Exam General: comfortable, NADPsych/Neuro: awake, alert, sometimes slow to respond to questions, oriented to self but does not answer other orientation questions today (sometimes knows she is in the hospital). Does not move lower extremities.CV: RRR, no m/r/gPulm: Breathing comfortably, no increased WOB, lungs CTABAbd: Soft, non-tender, non-distended. PEG and ostomy bag. Surgical scar.Extremities: No edema. Feet offloaded in boots.Current Meds:[Held by provider] apixaban, 5 mg, Q12Hchlorhexidine gluconate, 15 mL, BIDElemental iron, 45 mg, BIDenoxaparin (LOVENOX) treatment, 80 mg, Q12Hfamotidine, 20 mg, Dailyfolic acid, 1 mg, Dailyniacin, 100 mg, Nightlypyridoxine (vitamin B6), 100 mg, Daily[Held by provider] rosuvastatin, 10 mg, Nightlysodium chloride, 3 mL, Q8Hthiamine, 100 mg, BID Diagnostics:Recent Labs Lab 03/13/230440 03/14/230556 WBC 5.3 6.8 HGB 11.1* 9.8* HCT 37.70 31.60* PLT 385 335 Recent Labs Lab 03/13/230440 03/14/230556 03/15/230532 NA 137 - 137 137 136 K 3.4 - 3.4 3.9 3.8 CL 102 - 102 104 107 CO2 24 - 24 25 21  ANIONGAP 11 - 11 8 8  BUN 15 - 15 15 13  CREATININE 0.31* - 0.31* 0.28* 0.22* CALCIUM 11.5* - 11.5* 11.8* 10.5* Recent Labs Lab 03/15/230532 PHOS 2.4 I have reviewed new labs and imaging over the last 24 hours with the following notable findings:- Calcium improvingAssessment: Ms Cadavid is a 66yo female with a history of Rheumatoid arthritis and recent prolonged hospitalization for 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 Found to be febrile without clear infectious source found, fevers resolved. Extensive work-up of encephalopathy and leg weakness unremarkable. Course c/b elevated LFTs which resolved, likely 2/2 DILI from ceftriaxone or Zosyn. She has been stable and awaiting LTC placement, but with ongoing significant hypercalcemia.Plan: Hypercalcemia chronic, but notable (12.4 corrected on 3/14)- PTH normal (28)- Prior work-up including PTH-rP (9), vitamin D testing, TSH, random cortisol unrevealing- Possibly d/t immobility- Started IVF @ 150cc/hr on 3/14 with improvement in calcium, continue for now- Continue FWF via G-tube- F/u PTH mineral metabolism level (send-out test)- Daily calcium/albumin- Appreciate Endocrine recs re: additional therapy Acute encephalopathyLE weaknessUnclear etiology, possibly prolonged delirium iso hospitalization from Nov 2022 - currentMRI brain, MRI total spine unremarkableLP unremarkableParaneoplastic and autoimmune encephalopathy panels negativeOverall somewhat improved cognitive status during admission, remains alert and oriented x 1-2Neurology rec'd EMG to complete work-up of LE weakness but pt declined; Neurology does not recommend any further testing and signed off 3/8/23Elevated liver enzymes, resolvedAbnormal imaging findings - ?Multiple peritoneal nodulesLiver chem normal on admission but new elevation after 3d of admissionWork-up showed elevated IgG, smooth muscle antibodies c/f possible autoimmune hepatitisInitial concern that imaging showed peritoneal nodules ?carcinomatosisOn review of imaging, nodules improving, more likely to be continued resolution of debris from perforationLiver tests resolved without intervention, so most likely DILI from antibiotics (ceftriaxone and Zosyn)-- Check LFTs weekly to ensure she is not evolving autoimmune hepatitis given multiple positive autoimmune markersR lateral foot and L medial eminence deep tissue injuriesStableXray feet with degenerative  changes but no acute findings	Podiatry following - no dressing requirements, WBAT to BLE 	No need for abx at this time Elevated LFC ratioIron deficiency anemiaHematology signed off - elevated FLC ratio felt to be reactive, plasma cell disorder unlikely Fevers and leukocytosis resolvedNo clear source identified, possibly 2/2 DVT vs autoimmune?S/p ceftriaxone 2/21 and Zosyn 2/21-2/25Chronic conditionsRheumatoid Arthritis - has been off of MTX since November/acute illness, rheumatology signing off current ams/weakness not felt to be related to her RA, follow up outpatient Bilateral LE DVT - ?provoked from recent surgery, cont lovenox, can resume apixaban at discharge# Hospital Issues- F: none- E: replete PRN- N: Tube feeds- GI: bowel regimen- PPx: lovenox - Code Status:Full ACLS# Dispo: LTC, family looking into T19 application Medical decision making for this patient was moderateDiet Tube Feed No TrayVTE PPx: therapeutic anticoagulationMobility: Highest Level of mobility - ACTUAL: Mobility Level 2, Turn self in bed/bed activity/dependent transfer,AM PAC 6-7Communication: updated sister via telephone 3/15Dispo: LTC, family working on T19 application, not medically ready (hypercalcemia is outstanding issue)Last Covid swab: Lab Results Component Value Date  SARSCOV2 Negative 02/23/2022  Full ACLSDecision making for this encounter was moderate.Signed:Tarquin Welcher A Kian Ottaviano, MD3/15/2023

## 2022-03-05 NOTE — Plan of Care
Plan of Care Overview/ Patient Status    1900-0700Patient alert and oriented to self. Patient calm and cooperative with care. VSS on RA.  Lung sounds diminished. Abdomen soft and non tender. All scheduled medication given as ordered, meds via PEG . PRN tylenol given for generalized pain. Bolus feed ( pivot 1.5) tolerated well and q4h flushed given as order. PEG site red with yellow drainage, area cleansed w/ NS and new dressing applied. FS monitored. Ileostomy with good output, appliance CDI. Oral care provided. Dressing to BLE CDI, changed is shift. Podus boots in place. Patient turned and reposition q2h. No issues overnight, Will continue to monitor and follow POC. Please see flow sheet and MAR for more details.

## 2022-03-05 NOTE — Other
Updated sister [name] via telephone. All questions answered.

## 2022-03-05 NOTE — Progress Notes
[facility]	 [facility]	Endocrine Progress NoteAttending Provider: Janann Colonel, MD Subjective: Interim History: - patient's calcium dropped to 11.6 from 12.4 yesterdayInpatient Medications:I have reviewed the patient's current medication orders.Objective: Vitals:Vitals:  03/04/22 0844 03/04/22 1613 03/04/22 2325 03/05/22 0829 BP: 122/85 124/80 107/70 131/81 Pulse: (!) 103 (!) 97 (!) 113 90 Resp: 16 16 20 18  Temp: 98.2 ?F (36.8 ?C) 99 ?F (37.2 ?C) 98.5 ?F (36.9 ?C) 98.7 ?F (37.1 ?C) TempSrc: Axillary Axillary Oral Axillary SpO2: 96% 99% 98% 97% Weight:     Height:     Endocrine Physical Exam:GENERAL: NADHEENT: Head is normocephalic and atraumatic. LUNGS: Chest rise even and unlaboredHEART: No appreciable JVDABDOMEN: NondistendedEXTREMITIES: Without any cyanosisNEUROLOGIC: CN II - XII grossly intactPSYCHIATRIC: confusedLabs/Imaging:I have reviewed the patient's labs/imaging within the last 24 hrs. Significant findings are below.Lab Results Component Value Date  CREATININE 0.22 (L) 03/05/2022  CALCIUM 10.5 (H) 03/05/2022  PHOS 2.4 03/05/2022  ALBUMIN 2.6 (L) 03/05/2022  PTH 36.0 03/05/2022  VITAMIND25HY 48 02/21/2022  VITD125DH 43 02/21/2022 Impression: Danielle [name] is a 66 year old woman with a PMH of RA on methotrexate. 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 She had continued encephalopathy ultimately requiring PEG placement. After discharge on 02/05/22, her sister brought her to [place]. She was admitted to [facility] on 02/11/22. Endocrine was consulted due to ongoing hypercalcemia. Although her PTH is not completely suppressed, it is not the driver of her hypercalcemia. The DDx for non-PTH mediated hypercalcemia includes granulomatous disease, exogenous intake of calcium/vitamin D, liver disease, malignancy, hyperthyroidism, immobilization, etc. Given that her diet has been controlled inpatient for months, excessive supplementation is less likely, confirmed by her normal vitamin D level. Her normal vitamin D 1,25 + chest imaging without evidence of granulomatous disease makes granulomatous disease unlikely. Her normal TSH and LFTs make hyperthyroidism and liver disease unlikely causes. 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 Hypercalcemia driven by immobility is possible, but her age makes this less likely to be the only cause. A mid-molecule PTH level has been drawn and is pending to determine if primary hyperparathyroidism is the cause of her elevated calcium. If so, cinacalcet can be considered. Recommendations: - continue normal saline at 150 cc/hr if patient can tolerate it- daily calcium + albumin for now- follow up mid-molecule PTH lab (drawn and pending- avoid dehydration, thiazides, lithium, high calcium diet (> 1000 mg daily), and prolonged inactivityPlease refer to attending's addendum for final recommendations. [name], MDEndocrine FellowMHB: [phone]

## 2022-03-05 NOTE — Progress Notes
[facility] HealthMedicine Progress NoteAttending Provider: Janann Colonel, MDSubjective: Reason for Admission: Acute encephalopathyInterim History: Patient resting comfortably in bedReports some vaginal pain and itching; not worse with urinatingOtherwise feeling ok todayReview of Meds: Scheduled:Current Facility-Administered Medications Medication Dose Route Frequency Provider Last Rate Last Admin ? [Held by provider] apixaban (ELIQUIS) tablet 5 mg  5 mg Per G Tube Q12H Littie Deeds, MD     ? chlorhexidine gluconate (PERIDEX) 0.12 % solution 15 mL  15 mL Mouth/Throat BID Dorisann Frames, MD   15 mL at 03/04/22 0850 ? Elemental iron 60 mg (300 mg Ferrous sulfate) / 5 mL oral liquid 45 mg  45 mg Per G Tube BID Dorisann Frames, MD   45 mg at 03/04/22 0850 ? enoxaparin (LOVENOX) injection 80 mg  80 mg Subcutaneous Q12H Chaar, Abdelkader, MD   80 mg at 03/04/22 0850 ? famotidine (PEPCID) suspension (8 mg/mL) 20 mg  20 mg Per G Tube Daily Dorisann Frames, MD   20 mg at 03/04/22 0850 ? folic acid (FOLVITE) tablet 1 mg  1 mg Per G Tube Daily Dorisann Frames, MD   1 mg at 03/04/22 1610 ? niacin Immediate Release tablet 100 mg  100 mg Per G Tube Nightly Dorisann Frames, MD   100 mg at 03/03/22 2116 ? pyridoxine (vitamin B6) (B-6) tablet 100 mg  100 mg Per G Tube Daily Dorisann Frames, MD   100 mg at 03/04/22 0850 ? [Held by provider] rosuvastatin (CRESTOR) tablet 10 mg  10 mg Per G Tube Nightly Dorisann Frames, MD   10 mg at 02/14/22 2245 ? sodium chloride 0.9 % flush 3 mL  3 mL IV Push Q8H Dorisann Frames, MD   3 mL at 03/03/22 2117 ? thiamine (VITAMIN B1) tablet 100 mg  100 mg Per G Tube BID Dorisann Frames, MD   100 mg at 03/04/22 0850 PRN: acetaminophen, dextrose (GLUCOSE) oral liquid 15 g **OR** fruit juice **OR** skim milk, dextrose (GLUCOSE) oral liquid 30 g **OR** fruit juice, dextrose injection, dextrose injection, glucagon, sodium chlorideContinuous Infusions: ? sodium chloride 150 mL/hr (03/04/22 1718) Objective: Vitals:Temp:  [98.2 ?F (36.8 ?C)-99 ?F (37.2 ?C)] 99 ?F (37.2 ?C)Pulse:  [97-112] 97Resp:  [16-20] 16BP: (122-143)/(80-85) 124/80SpO2:  [96 %-99 %] 99 %Device (Oxygen Therapy): room airI/O:Gross Totals (Last 24 hours) at 03/04/2022 2023Last data filed at 03/04/2022 1543Intake 1954 ml Output 225 ml Net 1729 ml Physical Exam:	Constitutional:  NAD, ill appearing HENT: Normocephalic, atraumatic. No scleral icterus. Neck with normal range of motion.CARD: Normal rate, regular rhythm. Normal heart sounds, intact pulses.PULML:  Effort normal, breath sounds normal. GI: Soft, nontender, nondistended. EXT: No edemaSKIN: Warm and dry. NEURO: Alert and oriented x1 (states she's in a supermarket), moves upper extremities voluntarilyLabs:Recent Labs Lab 03/13/230440 03/14/230556 WBC 5.3 6.8 HGB 11.1* 9.8* HCT 37.70 31.60* PLT 385 335  Recent Labs Lab 03/14/230556 NEUTROPHILS 47.7  Recent Labs Lab 03/12/230453 03/12/230838 03/13/230440 03/13/230851 03/14/230556 03/14/230845 03/14/231647 NA 137  --  137 - 137  --  137  --   --  K 3.7  --  3.4 - 3.4  --  3.9  --   --  CL 105  --  102 - 102  --  104  --   --  CO2 25  --  24 - 24  --  25  --   --  BUN 15  --  15 - 15  --  15  --   --  CREATININE 0.29*  --  0.31* - 0.31*  --  0.28*  --   --  GLU 101*   < > 117* - 117*   < > 105*   < > 120* ANIONGAP 7  --  11 - 11  --  8  --   --   < > = values in this interval not displayed.  Recent Labs Lab 03/12/230453 03/13/230440 03/14/230556 CALCIUM 10.8* 11.5* - 11.5* 11.8*  Recent Labs Lab 03/10/230549 03/12/230453 03/13/230440 ALT 17 16 21  AST 23 19 26  ALKPHOS 101 91 99 BILITOT 0.3 0.2 0.2 No results for input(s): PTT, LABPROT, INR in the last 168 hours. FINGERSTICKS: Recent Labs Lab 03/13/231345 03/13/231746 03/14/230556 03/14/230845 03/14/231232 03/14/231647 GLU 120* 112* 105* 130* 133* 120* Micro:Lab Results Component Value Date  LABBLOO No Growth after 5 days of incubation 02/12/2022  LABBLOO No Growth after 5 days of incubation 02/12/2022 Assessment: [name] is a 66yo female with a history of Rheumatoid arthritis and recent prolonged hospitalization for perforated diverticulitis (s/p ex Lap/R hemicolectomy with end ileostomy) who presented for LTAc placement and persistent encephalopathy. In the ED pt found to have fevers, admitted by hospitalist service for further care. Extensive work-up of encephalopathy unremarkable. Course c/b elevated LFTs, initially considered liver biopsy but normalized so most likely DILI. Now stable and awaiting LTC, but with ongoing significant hypercalcemia.Plan: Hypercalcemia chronic, but now 12.4 corrected today- PTH normal (28)- Prior work-up including PTH-rP (9), vitamin D testing, TSH, random cortisol unrevealing- Possibly d/t immobility- Appreciate Endocrine recommendations:	- Start IVF @ 150cc/hr; will continue FWF via G-tube	- Check PTH (mineral metabolism) level tomorrow morning to evaluate for primary hyperparathyroidism	- Possible IV bisphosphanate therapy depending on repeat PTH level Acute encephalopathyLE weaknessUnclear etiology, insetting of prolonged hospitalization from November of 2022 to Feb 2023S/p LP - unremarkableMRI brain - microvascular disease with no new findings, volume loss enlarged lateral and third ventricles can be seen with NPH MRI total spine - scoliotic deformity of thoracolumbar spine, moderate degenerative spondylosis of cervical and lumbar spine, no acute infectious pathology noted. CSF labs and autoimmune encephalopathy panel negativeNeurology has signed off appreciate recommendations. 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 On review of imaging of the nodules they are improving and less likely representative of carcinomatosis and instead continued resolution of debris from perforation. -- LFTs normalized, will check weeklyR lateral foot and L medial eminence deep tissue injuriesStableXray feet with degenerative changes but no acute findings	Podiatry following - no dressing requirements, WBAT to BLE 	No need for abx at this time Elevated LFC ratioIron deficiency anemiaHematology signed off - elevated FLC ratio felt to be reactive, plasma cell disorder unlikely Fevers and leukocytosis resolvedNo clear source identified, possibly 2/2 DVT vs autoimmune?S/p zosyn x5d Chronic conditionsRheumatoid Arthritis - has been off of MTX since November/acute illness, rheumatology signing off current ams/weakness not felt to be related to her RA, follow up outpatient Bilateral LE DVT - ?provoked from recent surgery,  cont lovenox	# Hospital Issues- F: none- E: replete PRN- N: Tube feeds- GI: bowel regimen- PPx: lovenox - Code Status:Full ACLS# Dispo: LTC, family looking into T19 application Medical decision making for this patient was moderateSigned:Johnanthony Wilden, MDHospitalist Medicine Attending3/14/2023

## 2022-03-05 NOTE — Plan of Care
Plan of Care Overview/ Patient Status    0700-1900Pt is A&Ox1. Calm, on R.A. soft BP. Takes meds crushed through PEG as well as bolus tube feed and free water flush. PRN Tylenol given for pain. Wound dressing done per MAR. Oral care given. T&R q2hrs. Safety maintained w/ bed in lowest position and rounding.  Problem: Adult Inpatient Plan of CareGoal: Plan of Care ReviewOutcome: Interventions implemented as appropriateGoal: Patient-Specific Goal (Individualized)Outcome: Interventions implemented as appropriateGoal: Absence of Hospital-Acquired Illness or InjuryOutcome: Interventions implemented as appropriateGoal: Optimal Comfort and WellbeingOutcome: Interventions implemented as appropriate

## 2022-03-06 LAB — RENAL FUNCTION PANEL
BKR ALBUMIN: 2.8 g/dL — ABNORMAL LOW (ref 3.6–4.9)
BKR ANION GAP: 6 — ABNORMAL LOW (ref 7–17)
BKR BLOOD UREA NITROGEN: 9 mg/dL (ref 8–23)
BKR BUN / CREAT RATIO: 33.3 — ABNORMAL HIGH (ref 8.0–23.0)
BKR CALCIUM: 10.2 mg/dL (ref 8.8–10.2)
BKR CHLORIDE: 111 mmol/L — ABNORMAL HIGH (ref 98–107)
BKR CO2: 23 mmol/L (ref 20–30)
BKR CREATININE: 0.27 mg/dL — ABNORMAL LOW (ref 0.40–1.30)
BKR EGFR, CREATININE (CKD-EPI 2021): >60 mL/min/{1.73_m2} (ref >=60)
BKR GLUCOSE: 88 mg/dL (ref 70–100)
BKR PHOSPHORUS: 2 mg/dL — ABNORMAL LOW (ref 2.2–4.5)
BKR POTASSIUM: 3.4 mmol/L (ref 3.3–5.3)
BKR SODIUM: 140 mmol/L (ref 136–144)

## 2022-03-06 MED ORDER — POTASSIUM, SODIUM PHOSPHATES 280 MG-160 MG-250 MG ORAL POWDER PACKET
280-160-250 mg | Freq: Four times a day (QID) | GASTROSTOMY | Status: CP
Start: 2022-03-06 — End: ?
  Administered 2022-03-06 – 2022-03-07 (×4): 280-160-250 mg via GASTROSTOMY

## 2022-03-06 NOTE — Plan of Care
[name]					Location: EP [id number] y.o., female				Attending: Janann Colonel, MD	Admit Date: 02/11/2022			[name] LOS: 23 days FOLLOW-UP NUTRITION ASSESSMENT Dietary Orders (From admission, onward)     Start     Ordered  02/26/22 0952  Diet Tube Feed No Tray  DIET EFFECTIVE NOW      Comments: PIVOT 1.5 @ 1 carton bolus TID @9am ,1pm,5pm with 1.5 cartons at 9pm to total 5.5 cartons daily) Question Answer Comment Adult Tube Feed Formulas: Pivot 1.5 (BH,MH,YNH,SRC)  Feeding Route: G-Tube  Continuous Rate ml/hr: Other(see comments)  Initiate Nutrition Management Protocol (Yes/No?) Yes - Initiate Protocol    02/26/22 0952    No Known AllergiesANTHROPOMETRICS:Height:???70Admit wt:?no recent wt to assess.?Current wt:?79.4 kg (02/14/22)?Dosing Wt:?78?kg (based on?hamwi +10%)BMI:?25??(based on?current wt)?Additional wt information:Ht confirmed with pt: NO?- pt not able to provide hx. Pt not in a bed with scale. Pt last known wt was 98.6 kg (12/13/2018).?Wt loss?confirmed with admit wt?as pt no longer weighs?98 kg.?Time line for wt loss in not clear and pt cannot report.???? ? Wt Readings from Last 20 Encounters: 12/13/18 98.6 kg ??NUTRITION-FOCUSED PHYSICAL EXAM:-Muscle Depletion: Moderate????????????temples, clavicles, shoulders, interosseous, scapula, calf-Subcutaneous Fat Depletion: Moderate ????????????orbital region, triceps, ribs?ESTIMATED NUTRITION REQUIREMENTS:Kcal/day:?1950-2340??????????????(25 -30?kcal/kg)Protein/day:?94 - 117?????????????(1.2-1.5gm/kg)Fluids/day:?1950-2340 mL/d?(46mL/kcal) ONLY as medical and fluid status allows and only per team discretion. Needs based on:?Dosing Wt (78?kg), ? Carcinomatosis, maintenance.?NUTRITION ASSESSMENT:?PT EMR reviewed for follow up assessment. Pt maintained on tube feeds over this past several weeks. No intolerances reported over this past week. ????Pt?not able to provide hx. Labs and meds reviewed and standard formula remains appropriate to continue. Labs notable for?stable sodium, potassium, phosphorus low today may require repletion. Stable?BUN/Cr, and stable glucose on goal rate feeds. No treatment provided for glucose control. Will maintain current feeding regimen as pt appears to be tolerating and meet 100% of estimated requirements.???Cultural/Religious/Ethnic NeeTIBds:?Unknown?NUTRITION DIAGNOSIS:?Severe malnutrition r/t depletion aeb moderate muscle and fat depletion.- continues??INTERVENTIONS/RECOMMENDATIONS:?Enteral/ Parenteral Nutrition?	Formula-?PIVOT 1.5?@ 1 carton bolus TID (@9am ,1pm,5pm) with 1.5 cartons at 9pm to total 5.5 cartons daily)-	Provides:?1.3L,?1955kcal,?121gram Protein,?986?mL free water/d-	Additional Free water @?960?mL/d as medical/ fluid status allows at Teams discretion.?Pt getting 1600 ml free water through flushes with every 6 hours.??	Goal: ?-	Patient will receive >90% of goal volume TF/ TPN prior to next nutrition assessment- met will continue???Discharge and Transfer of Nutrition Care to New Setting or Provider:?	?Nutrition Prescription- Discharge needs not determined at this time will continue to follow ?MONITORING / EVALUATION:Food/Nutrition-Related Outcomes:????????????food and nutrient intake/administration.Anthropometric Outcomes:????????????weightBiochemical Data, Medical Tests, and Procedure Outcomes:????????????Labs (General chemistry, renal labs, Glucose).Nutrition-Focused Physical Finding Outcomes:????????????Physical appearance, muscle and fat wasting, swallow function, appetite.?Electronically signed by Sherrilee Gilles, RD, March 16, 2023M[facility]- [phone] Please note: Nutrition is a consult only service on weekends and holidays.  Please enter a consult in EPIC if assistance is needed on a weekend or holiday or via [facility] [phone] to contact the covering RD.

## 2022-03-06 NOTE — Progress Notes
Daily Progress NoteHospital Medicine ServiceAttending Provider: Janann Colonel, MD [phone] Day #23Patient seen and examined on 03/06/2022.CC: Fever, unspecified fever cause24-hr Events: - Calcium improved with NS @ 150cc/hr Subjective: - Reports feeling thirsty this morning, otherwise no acute complaintsObjective: Vitals:Temp:  [97.8 ?F (36.6 ?C)-99.5 ?F (37.5 ?C)] 97.8 ?F (36.6 ?C)Pulse:  [87-98] 87Resp:  [18-20] 18BP: (105-114)/(64-75) 114/68SpO2:  [97 %-98 %] 98 %Device (Oxygen Therapy): room airI/O's:Intake/Output Summary (Last 24 hours) at 03/06/2022 1438Last data filed at 03/06/2022 0600Gross per 24 hour Intake 910 ml Output 350 ml Net 560 ml Physical Exam General: comfortable, NADPsych/Neuro: awake, alert, fully participating in conversation today. Oriented to self, not able to name location but lists hospital when given a list, knows today is Thursday but not able to state month or year. Follows commands - moves both arms spontaneously (does not lift right arm as high as left) and grip strength intact bilaterally. Wiggles toes on both feet but does not otherwise move/lift legs.CV: Mild regular tachycardia, no m/r/gPulm: Breathing comfortably, no increased WOBAbd: Soft, non-tender, non-distended. PEG and ostomy bag. Surgical scar.Extremities: No edema. Feet offloaded in boots.Current Meds:[Held by provider] apixaban, 5 mg, Q12Hchlorhexidine gluconate, 15 mL, BIDElemental iron, 45 mg, BIDenoxaparin (LOVENOX) treatment, 80 mg, Q12Hfamotidine, 20 mg, Dailyfolic acid, 1 mg, Dailyniacin, 100 mg, Nightlypotassium and sodium phosphates (ELEMENTAL phosphorus 250 mg/packet), 1 packet, 4x Dailypyridoxine (vitamin B6), 100 mg, Daily[Held by provider] rosuvastatin, 10 mg, Nightlysodium chloride, 3 mL, Q8Hthiamine, 100 mg, BID Diagnostics:Recent Labs Lab 03/13/230440 03/14/230556 WBC 5.3 6.8 HGB 11.1* 9.8* HCT 37.70 31.60* PLT 385 335 Recent Labs Lab 03/14/230556 03/15/230532 03/16/230630 NA 137 136 140 K 3.9 3.8 3.4 CL 104 107 111* CO2 25 21 23  ANIONGAP 8 8 6* BUN 15 13 9  CREATININE 0.28* 0.22* 0.27* CALCIUM 11.8* 10.5* 10.2 Recent Labs Lab 03/16/230630 PHOS 2.0* I have reviewed new labs and imaging over the last 24 hours with the following notable findings:- Calcium 12.4 on 3/14 --> 11.6 --> 11.2 corrected 3/16Assessment: Danielle [name] is a 65yo female with a history of Rheumatoid arthritis and recent prolonged hospitalization for 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 Found to be febrile without clear infectious source found, fevers resolved. Extensive work-up of encephalopathy and leg weakness unremarkable. Course c/b elevated LFTs which resolved, likely 2/2 DILI from ceftriaxone or Zosyn. She has been stable and awaiting LTC placement, but with ongoing significant hypercalcemia.Plan: Hypercalcemia chronic, but notable (12.4 corrected on 3/14)- PTH normal (28)- Prior work-up including PTH-rP (9), vitamin D testing, TSH, random cortisol unrevealing- Possibly d/t immobility- Started IVF @ 150cc/hr on 3/14, calcium improving, continue for now- Continue FWF via G-tube- F/u PTH mineral metabolism level (send-out test)- Daily calcium/albumin- Appreciate Endocrine recs re: possible additional therapy pending PTH results Acute encephalopathyLE weaknessUnclear etiology, possibly prolonged delirium iso hospitalization from Nov 2022 - currentMRI brain, MRI total spine unremarkableLP unremarkableParaneoplastic and autoimmune encephalopathy panels negativeOverall somewhat improved cognitive status during admission, remains alert and oriented x 1-2Neurology rec'd EMG to complete work-up of LE weakness but pt declined; Neurology does not recommend any further testing and signed off 3/8/23Elevated liver enzymes, resolvedAbnormal imaging findings - ?Multiple peritoneal nodulesLiver chem normal on admission but new elevation after 3d of admissionWork-up showed elevated IgG, smooth muscle antibodies c/f possible autoimmune hepatitisInitial concern that imaging showed peritoneal nodules ?carcinomatosisOn review of imaging, nodules improving, more likely to be continued resolution of debris from perforationLiver tests resolved without intervention, so most likely DILI from antibiotics (  ceftriaxone and Zosyn)-- Check LFTs weekly to ensure she is not evolving autoimmune hepatitis given multiple positive autoimmune markersR lateral foot and L medial eminence deep tissue injuriesStableXray feet with degenerative changes but no acute findings	Podiatry following - no dressing requirements, WBAT to BLE 	No need for abx at this time Elevated LFC ratioIron deficiency anemiaHematology signed off - elevated FLC ratio felt to be reactive, plasma cell disorder unlikely Fevers and leukocytosis resolvedNo clear source identified, possibly 2/2 DVT vs autoimmune?S/p ceftriaxone 2/21 and Zosyn 2/21-2/25Chronic conditionsRheumatoid Arthritis - has been off of MTX since November/acute illness, rheumatology signing off current ams/weakness not felt to be related to her RA, follow up outpatient Bilateral LE DVT - ?provoked from recent surgery, cont lovenox, can resume apixaban at discharge# Hospital Issues- F: none- E: replete PRN- N: Tube feeds- GI: bowel regimen- PPx: lovenox - Code Status:Full ACLS# Dispo: LTC, family looking into T19 application Medical decision making for this patient was moderateDiet Tube Feed No TrayVTE PPx: therapeutic anticoagulationMobility: Highest Level of mobility - ACTUAL: Mobility Level 2, Turn self in bed/bed activity/dependent transfer,AM PAC 6-7Communication: updated sister via telephone 3/15Dispo: LTC, family working on T19 application, not medically ready (hypercalcemia is outstanding issue)Last Covid swab: Lab Results Component Value Date  SARSCOV2 Negative 02/23/2022  Full ACLSDecision making for this encounter was moderate.Signed:Ariany Kesselman A Danielle [name], MD3/16/2023

## 2022-03-06 NOTE — Plan of Care
Plan of Care Overview/ Patient Status    1900-0700A&Ox1, disoriented to time, situation and place. Pt is very confused and says inappropriate things. Pt VSS except for tachycardia. Pt didn't exhibit any pain and SOB. Pt takes meds crushed through the PEG tube. Bolus TF of PIVOT 1.5 given this shift. Pt has NS running at 183ml/hr. Pt has one PIV in the L/arm, C/D/I. Pt is incontinent of urine, incontinence care provided this shift. Pt ileostomy bag changed this shift because they pulled off the bag. Pt is on speciality bed and was T&R q2hrs. Hourly rounding completed, safety maintained, will continue to follow POC.

## 2022-03-07 NOTE — Plan of Care
Plan of Care Overview/ Patient Status    Medical Social Work Follow Up  [employer] Most Recent Value Admission Information  Document Type Progress Note Reason for Inability to Assess --  [Patient mental status is altered] (For Inpatient/ED Only) Prior psychosocial assessment has been documented within this hospitalization No (For Inpatient/ED Only) Prior psychosocial assessment has been documented within 30 days of this hospitalization No Reason for Current Social Work Regulatory affairs officer of Information Family/Caregiver Family/Caregiver Comment Danielle [name] Record Reviewed Yes Level of Care Inpatient Psychosocial issues requiring intervention Resources Psychosocial interventions 10 minutes spent on the phone with Danielle [name] sister, Danielle [name]. Danielle reported that she was in the process with gathering all the documents for the Title 19 application. She requested assistance with choosing a long term care facility as she expressed her concerns about the previous care Danielle [name] had in [place]. I validated her feelings and provided support. I provided her with a link for the [place] governement website providing information regarding nursing facilities. Danielle appreciated my assistance and I thanked her for her time. Collaborations Danielle [name] (Sister) Specific referrals to enhance community supports (include existing and new resources) Capacity evaluation, Management consultant, T-19 application submission Handoff Required? No Next Steps/Plan (including hand-off): I will remain involved for support as needed. Signature: Leim Fabry, LMSW Contact Information: [phone]

## 2022-03-07 NOTE — Progress Notes
Daily Progress NoteHospital Medicine ServiceAttending Provider: Janann Colonel, MD [phone] Day #24Patient seen and examined on 03/07/2022.CC: Fever, unspecified fever cause24-hr Events: - Calcium improved with NS @ 150cc/hr Subjective: - Says she wants to get out of bed but no other acute complaints- Knows she's in the hospital but not otherwise oriented to situationObjective: Vitals:Temp:  [97.8 ?F (36.6 ?C)-99.2 ?F (37.3 ?C)] 99.1 ?F (37.3 ?C)Pulse:  [85-87] 85Resp:  [18] 18BP: (109-135)/(68-80) 135/80SpO2:  [98 %-100 %] 100 %Device (Oxygen Therapy): room airI/O's:Intake/Output Summary (Last 24 hours) at 03/07/2022 0757Last data filed at 03/06/2022 1900Gross per 24 hour Intake 455 ml Output 0 ml Net 455 ml Physical Exam General: comfortable, NADPsych/Neuro: awake, alert, answers questions with full sentences but soft voice. Oriented to self and hospital only. Follows commands. Wiggles toes bilaterally but does not otherwise move legs.CV: RRR, no m/r/gPulm: Breathing comfortably, no increased WOBAbd: Soft, non-tender, non-distended. PEG and ostomy bag. Surgical scar.Extremities: No edema. Feet offloaded in boots.Current Meds:[Held by provider] apixaban, 5 mg, Q12Hchlorhexidine gluconate, 15 mL, BIDElemental iron, 45 mg, BIDenoxaparin (LOVENOX) treatment, 80 mg, Q12Hfamotidine, 20 mg, Dailyfolic acid, 1 mg, Dailyniacin, 100 mg, Nightlypotassium and sodium phosphates (ELEMENTAL phosphorus 250 mg/packet), 1 packet, 4x Dailypyridoxine (vitamin B6), 100 mg, Daily[Held by provider] rosuvastatin, 10 mg, Nightlysodium chloride, 3 mL, Q8Hthiamine, 100 mg, BID Diagnostics:Recent Labs Lab 03/13/230440 03/14/230556 WBC 5.3 6.8 HGB 11.1* 9.8* HCT 37.70 31.60* PLT 385 335 Recent Labs Lab 03/14/230556 03/15/230532 03/16/230630 NA 137 136 140 K 3.9 3.8 3.4 CL 104 107 111* CO2 25 21 23  ANIONGAP 8 8 6* BUN 15 13 9  CREATININE 0.28* 0.22* 0.27* CALCIUM 11.8* 10.5* 10.2 Recent Labs Lab 03/16/230630 PHOS 2.0* I have reviewed new labs and imaging over the last 24 hours with the following notable findings:- Corrected calcium 12.4 on 3/14 --> 11.6 --> 11.2 on 3/16Assessment: Danielle [name] is a 66yo female with a history of Rheumatoid arthritis and recent prolonged hospitalization for 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 Found to be febrile without clear infectious source found, fevers resolved. Extensive work-up of encephalopathy and leg weakness unremarkable. Course c/b elevated LFTs which resolved, likely 2/2 DILI from ceftriaxone or Zosyn. She has been stable and awaiting LTC placement, but with ongoing significant hypercalcemia.Plan: Hypercalcemia chronic, but notable (12.4 corrected on 3/14)- PTH normal (28)- Prior work-up including PTH-rP (9), vitamin D testing, TSH, random cortisol unrevealing- Possibly d/t immobility or primary hyperparathyroidism; sent additional PTH testing with mineral metabolism level (send-out test)- Started IVF @ 150cc/hr on 3/14, calcium improving/stable, continue for now- Continue FWF via G-tube- Daily calcium/albumin- Appreciate Endocrine recs re: possible additional therapy pending PTH results Acute encephalopathyLE weaknessUnclear etiology, possibly prolonged delirium iso hospitalization from Nov 2022 - currentMRI brain, MRI total spine unremarkableLP unremarkableParaneoplastic and autoimmune encephalopathy panels negativeOverall somewhat improved cognitive status during admission, remains alert and oriented x 1-2Neurology rec'd EMG to complete work-up of LE weakness but pt declined; Neurology does not recommend any further testing and signed off 3/8/23Elevated liver enzymes, resolvedAbnormal imaging findings - ?Multiple peritoneal nodulesLiver chem normal on admission but new elevation after 3d of admissionWork-up showed elevated IgG, smooth muscle antibodies c/f possible autoimmune hepatitisInitial concern that imaging showed peritoneal nodules ?carcinomatosisOn review of imaging, nodules improving, more likely to be continued resolution of debris from perforationLiver tests resolved without intervention, so most likely DILI from antibiotics (ceftriaxone and Zosyn)-- Check LFTs weekly to ensure she is not evolving autoimmune hepatitis given multiple positive autoimmune markersR  lateral foot and L medial eminence deep tissue injuriesStableXray feet with degenerative changes but no acute findings	Podiatry following - no dressing requirements, WBAT to BLE 	No need for abx at this time Elevated LFC ratioIron deficiency anemiaHematology signed off - elevated FLC ratio felt to be reactive, plasma cell disorder unlikely Fevers and leukocytosis resolvedNo clear source identified, possibly 2/2 DVT vs autoimmune?S/p ceftriaxone 2/21 and Zosyn 2/21-2/25Chronic conditionsRheumatoid Arthritis - has been off of MTX since November/acute illness, rheumatology signing off current ams/weakness not felt to be related to her RA, follow up outpatient Bilateral LE DVT - ?provoked from recent surgery, cont lovenox, can resume apixaban at discharge# Hospital Issues- F: none- E: replete PRN- N: Tube feeds- GI: bowel regimen- PPx: lovenox - Code Status:Full ACLS# Dispo: LTC, family looking into T19 application Medical decision making for this patient was moderateDiet Tube Feed No TrayVTE PPx: therapeutic anticoagulationMobility: Highest Level of mobility - ACTUAL: Mobility Level 2, Turn self in bed/bed activity/dependent transfer,AM PAC 6-7Communication: updated sister via telephone 3/15Dispo: LTC, family working on T19 application, not medically ready (hypercalcemia is outstanding issue)Last Covid swab: Lab Results Component Value Date  SARSCOV2 Negative 02/23/2022  Full ACLSDecision making for this encounter was moderate.Signed:Xayla [name] A Kadeja Granada, MD3/17/2023

## 2022-03-07 NOTE — Care Coordination-Inpatient
2:27PMWriter reached out to patient's sister Jacquenette Shone regarding STR choices. [name] has agreed and requested the search be near [place] or [facility], [place] as patient now resides with her in [place]. [name] was informed CM will continue to follow and keep her updated once referrals are sent out. [name] reports she has submitted T19 application via mail and has an assigned case worker named Marylene Land who will continue T19 process. CM will continue to follow.Gareth Morgan	Transition CoordinatorMHB: [phone]

## 2022-03-07 NOTE — Plan of Care
Inpatient Physical Therapy Progress Note IP Adult PT Eval/Treat - 03/07/22 1214    Date of Visit / Treatment  Date of Visit / Treatment 03/07/22   Progress Report Due 03/31/22   End Time 1214    General Information  Subjective I feel a little bit better right now.   General Observations Supine in bed, HOB elevated, agreeable. Cleared by RN for PT.   Precautions/Limitations Fall Precautions;Bed alarm   Precautions/Limitations Comment WBAT BLE    Weight Bearing Status  Weight Bearing Status Comments WBAT BLE    Vital Signs and Orthostatic Vital Signs  Vital Signs Vital Signs Stable   Vital Signs Free text RA, NAD    Pain/Comfort  Pain Comment (Pre/Post Treatment Pain) LLE (ankle and knee) pain noted with movement. Unable to quantify. RN aware    Patient Coping  Diversional Activities television    Cognition  Overall Cognitive Status Impaired   Orientation Level Oriented to person   Level of Consciousness alert   Following Commands Follows one step commands with increased time;Follows one step commands with repetition   Personal Safety / Judgment Fall risk    Skin Assessment  Skin Assessment See Nursing Documentation    Balance  Balance Skills Training Comment Bed level only    Bed Mobility  Bed Mobility Comments UTA, Pt only agreeable to bed level therex    Handoff Documentation  Handoff Patient in bed;Bed alarm;Patient instructed to call nursing for mobility;Discussed with nursing    Endurance  Endurance Comments Poor    PT- AM-PAC - Basic Mobility Screen- How much help from another person do you currently need.....  Turning from your back to your side while in a a flat bed without using rails? 1 - Total - Requires total assistance or cannot do it at all.   Moving from lying on your back to sitting on the side of a flat bed without using bed rails? 1 - Total - Requires total assistance or cannot do it at all. Moving to and from a bed to a chair (including a wheelchair)? 1 - Total - Requires total assistance or cannot do it at all.   Standing up from a chair using your arms(e.g., wheelchair or bedside chair)? 1 - Total - Requires total assistance or cannot do it at all.   To walk in a hospital room? 1 - Total - Requires total assistance or cannot do it at all.   Climbing 3-5 steps with a railing? 1 - Total - Requires total assistance or cannot do it at all.   AMPAC Mobility Score 6   TARGET Highest Level of Mobility Mobility Level 2, Turn self in bed/bed activity/dependent transfer    ACTUAL Highest Level of Mobility Mobility Level 2, Turn self in bed/bed activity/ dependent transfer    Therapeutic Exercise  Therapeutic Exercise Comments BUE/BLE PROM and AAROM as tolerated in all planes of motion    Clinical Impression  Follow up Assessment Pt seen for PT follow up session. Pt sleeping upon arrival, aroused by light touch to shoulder. With encouragement agreeable to bed level therex. Tolerance improving, only LLE pain with movment initially, pain decreased with reps. Despite improvements Pt continues to be limited by decreased strength, ROM, balance and activity tolerance. Will benefit from continued skilled PT to improve functionla strength and mobility.    Patient/Family Stated Goals  Patient/Family Stated Goal(s) feel better    Frequency/Equipment Recommendations  PT Frequency 3x per week   What day of week is  next treatment expected? Tuesday   3/21  PT/PTA completing this assessment Patsey Berthold    PT Recommendations for Inpatient Admission  Activity/Level of Assist mechanical lift;assist of 2    Planned Treatment / Interventions  Plan for Next Visit Progress as tolerated   Education Treatment / Interventions Patient Education / Training    PT Discharge Summary  Physical Therapy Disposition Recommendation Short Term Rehab     Lemmie Evens, [place]: [phone]

## 2022-03-08 LAB — RENAL FUNCTION PANEL
BKR ALBUMIN: 3.2 g/dL — ABNORMAL LOW (ref 3.6–4.9)
BKR ANION GAP: 9 (ref 7–17)
BKR BLOOD UREA NITROGEN: 7 mg/dL — ABNORMAL LOW (ref 8–23)
BKR BUN / CREAT RATIO: 31.8 — ABNORMAL HIGH (ref 8.0–23.0)
BKR CALCIUM: 10.7 mg/dL — ABNORMAL HIGH (ref 8.8–10.2)
BKR CHLORIDE: 108 mmol/L — ABNORMAL HIGH (ref 98–107)
BKR CO2: 23 mmol/L (ref 20–30)
BKR CREATININE: 0.22 mg/dL — ABNORMAL LOW (ref 0.40–1.30)
BKR EGFR, CREATININE (CKD-EPI 2021): >60 mL/min/{1.73_m2} (ref >=60)
BKR GLUCOSE: 95 mg/dL (ref 70–100)
BKR PHOSPHORUS: 2.4 mg/dL (ref 2.2–4.5)
BKR POTASSIUM: 4 mmol/L (ref 3.3–5.3)
BKR SODIUM: 140 mmol/L (ref 136–144)

## 2022-03-08 NOTE — Plan of Care
Plan of Care Overview/ Patient Status    1900-0700. Pt alert and oriented to self only. VSS on RA except tacky at times. No c/o pain unless moving LLE for care and refused pain mads. Denied CP, n/v/d, sob. Ileostomy bag in place, liquid stool. Bed rest Ax2 needed. NPO and bolus feeding with pivot 1.5 and flushed 400cc q6 hrs. Wound care provided per Mar. Hourly rounding completed. T&R q2hrs. Bed alarm on. Call light within reach. No acute overnight event noted. Problem: Adult Inpatient Plan of CareGoal: Plan of Care ReviewOutcome: Interventions implemented as appropriateGoal: Patient-Specific Goal (Individualized)Outcome: Interventions implemented as appropriate Problem: InfectionGoal: Absence of Infection Signs and SymptomsOutcome: Interventions implemented as appropriate

## 2022-03-08 NOTE — Progress Notes
[facility]	Endocrine Progress NoteAttending Provider: Janann Colonel, MD Subjective: Interim History: NAEO. Corrected calcium 11.3 from 11.2 on 3/16 while on NS 150/hr. Pt denies pain or excessive thirst. Inpatient Medications:I have reviewed the patient's current medication orders.Objective: Vitals:Vitals:  03/07/22 1639 03/08/22 0025 03/08/22 0500 03/08/22 0751 BP: (!) 157/109 132/82 135/89 132/76 Pulse: 89 (!) 100 86 89 Resp: 18 20  20  Temp: 98.3 ?F (36.8 ?C) 98.4 ?F (36.9 ?C)  98.7 ?F (37.1 ?C) TempSrc: Oral Oral  Oral SpO2: 100% 98%  100% Weight:     Height:     Endocrine Physical Exam:GENERAL: NADHEENT: Head is normocephalic and atraumatic. LUNGS: Chest rise even and unlaboredHEART: No appreciable JVDABDOMEN: NondistendedEXTREMITIES: Without any cyanosisNEUROLOGIC: CN II - XII grossly intactPSYCHIATRIC: confusedLabs/Imaging:I have reviewed the patient's labs/imaging within the last 24 hrs. Significant findings are below.Lab Results Component Value Date  CREATININE 0.27 (L) 03/06/2022  CALCIUM 10.2 03/06/2022  PHOS 2.0 (L) 03/06/2022  ALBUMIN 2.8 (L) 03/06/2022  PTH 36.0 03/05/2022  VITAMIND25HY 48 02/21/2022  VITD125DH 43 02/21/2022 Impression: Danielle [name] is a 66 year old woman with a PMH of RA on methotrexate. 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 She had continued encephalopathy ultimately requiring PEG placement. After discharge on 02/05/22, her sister brought her to [place]. She was admitted to [facility] on 02/11/22. Endocrine was consulted due to ongoing hypercalcemia. Although her PTH is not completely suppressed, it is not the driver of her hypercalcemia. The DDx for non-PTH mediated hypercalcemia includes granulomatous disease, exogenous intake of calcium/vitamin D, liver disease, malignancy, hyperthyroidism, immobilization, etc. Given that her diet has been controlled inpatient for months, excessive supplementation is less likely, confirmed by her normal vitamin D level. Her normal vitamin D 1,25 + chest imaging without evidence of granulomatous disease makes granulomatous disease unlikely. Her normal TSH and LFTs make hyperthyroidism and liver disease unlikely causes. 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 Hypercalcemia driven by immobility is possible, but her age makes this less likely to be the only cause. A mid-molecule PTH level has been drawn and is pending to determine if primary hyperparathyroidism is the cause of her elevated calcium. If so, cinacalcet can be considered. Calcium overall stable today compared to previous. Favor continue NS as current rate as below.Recommendations: - continue normal saline at 150 cc/hr if patient can tolerate it- daily calcium + albumin for now- follow up mid-molecule PTH lab (drawn and pending- avoid dehydration, thiazides, lithium, high calcium diet (> 1000 mg daily), and prolonged inactivityPlease refer to attending's addendum for final recommendations. Electronically Signed by Brantley Fling, MD Endocrine Fellow - March 18, 2023[employer]: [phone] Pager: [phone]

## 2022-03-09 LAB — RENAL FUNCTION PANEL
BKR ALBUMIN: 2.5 g/dL — ABNORMAL LOW (ref 3.6–4.9)
BKR ANION GAP: 7 (ref 7–17)
BKR BLOOD UREA NITROGEN: 10 mg/dL (ref 8–23)
BKR BUN / CREAT RATIO: 47.6 — ABNORMAL HIGH (ref 8.0–23.0)
BKR CALCIUM: 9.6 mg/dL (ref 8.8–10.2)
BKR CHLORIDE: 111 mmol/L — ABNORMAL HIGH (ref 98–107)
BKR CO2: 22 mmol/L (ref 20–30)
BKR CREATININE: 0.21 mg/dL — ABNORMAL LOW (ref 0.40–1.30)
BKR EGFR, CREATININE (CKD-EPI 2021): >60 mL/min/{1.73_m2} (ref >=60)
BKR GLUCOSE: 87 mg/dL (ref 70–100)
BKR PHOSPHORUS: 2.5 mg/dL (ref 2.2–4.5)
BKR POTASSIUM: 3.4 mmol/L (ref 3.3–5.3)
BKR SODIUM: 140 mmol/L (ref 136–144)

## 2022-03-09 LAB — CBC WITH AUTO DIFFERENTIAL
BKR WAM ABSOLUTE NRBC (2 DEC): 0 x 1000/ÂµL (ref 0.00–1.00)
BKR WAM HEMATOCRIT (2 DEC): 28.7 % — ABNORMAL LOW (ref 35.00–45.00)
BKR WAM HEMOGLOBIN: 8.6 g/dL — ABNORMAL LOW (ref 11.7–15.5)
BKR WAM MCH (PG): 26.5 pg — ABNORMAL LOW (ref 27.0–33.0)
BKR WAM MCHC: 30 g/dL — ABNORMAL LOW (ref 31.0–36.0)
BKR WAM MCV: 88.6 fL (ref 80.0–100.0)
BKR WAM MPV: 10.1 fL (ref 8.0–12.0)
BKR WAM NUCLEATED RED BLOOD CELLS: 0 % (ref 0.0–1.0)
BKR WAM PLATELETS: 262 x1000/ÂµL (ref 150–420)
BKR WAM RDW-CV: 17.7 % — ABNORMAL HIGH (ref 11.0–15.0)
BKR WAM RED BLOOD CELL COUNT.: 3.24 M/ÂµL — ABNORMAL LOW (ref 4.00–6.00)
BKR WAM WHITE BLOOD CELL COUNT: 5.5 x1000/ÂµL (ref 4.0–11.0)

## 2022-03-09 LAB — MANUAL DIFFERENTIAL
BKR WAM ATYPICAL LYMPHOCYTES (DIFF) 1 DEC: 7.1 % — ABNORMAL HIGH (ref 0.0–1.0)
BKR WAM BASOPHIL - ABS (DIFF) 2 DEC: 0 x 1000/ÂµL (ref 0.00–1.00)
BKR WAM BASOPHILS (DIFF): 0 % (ref 0.0–1.4)
BKR WAM EOSINOPHILS (DIFF) 2 DEC: 0.53 x 1000/ÂµL (ref 0.00–1.00)
BKR WAM EOSINOPHILS (DIFF): 9.7 % — ABNORMAL HIGH (ref 0.0–5.0)
BKR WAM LYMPHOCYTE - ABS (DIFF) 2 DEC: 1.75 x 1000/ÂµL (ref 0.60–3.70)
BKR WAM LYMPHOCYTES (DIFF): 24.8 % (ref 17.0–50.0)
BKR WAM MONOCYTE - ABS (DIFF) 2 DEC: 0.29 x 1000/ÂµL (ref 0.00–1.00)
BKR WAM MONOCYTES (DIFF): 5.3 % (ref 4.0–12.0)
BKR WAM NEUTROPHILS (DIFF): 53.1 % (ref 39.0–72.0)
BKR WAM NEUTROPHILS - ABS (DIFF) 2 DEC: 2.92 x 1000/ÂµL (ref 2.00–7.60)

## 2022-03-09 LAB — MAGNESIUM: BKR MAGNESIUM: 1.6 mg/dL — ABNORMAL LOW (ref 1.7–2.4)

## 2022-03-09 MED ORDER — MAGNESIUM OXIDE 400 MG (241.3 MG MAGNESIUM) TABLET
400 mg (241.3 mg magnesium) | GASTROSTOMY | Status: CP
Start: 2022-03-09 — End: ?
  Administered 2022-03-09 (×2): 400 mg (241.3 mg magnesium) via GASTROSTOMY

## 2022-03-09 NOTE — Plan of Care
Plan of Care Overview/ Patient Status    0700-1900Alert to self only, lungs clear upper & diminished bases, no cough, on RA. Abd. soft NT/ND, BS + x 4; ileostomy in place patent and draining brown liquid stool. PEG tube in place patent and flushes well with no issues;  NPO,TF bolus Pivot as ordered, FWF 400 cc as per ordered. Wounds' dressings CDI,  care as per orders; blue heel lift boots in place, off for care and back on. T&R A x 2 in bed, propped with wedge and pillows. Oral care done.PIV in LFA leaking for night RN, unable to trouble shoot; removed, site asymptomatic. Notified PA C. Sura IVF NS on hold until new IV established. This Clinical research associate attempt x 2 PIV unsuccessful; notified SWAT RN; new PIV 22 G inserted by SWAT RN right hand dorsal; IVF NS resumed at 150 cc /hr. as per orders.

## 2022-03-09 NOTE — Progress Notes
Daily Progress NoteHospital Medicine ServiceAttending Provider: Janann Colonel, MD [phone] Day #25Patient seen and examined on 03/08/2022.CC: Fever, unspecified fever cause24-hr Events: - Calcium improved/stable with NS @ 150cc/hr Subjective: - No acute complaints todayObjective: Vitals:Temp:  [98.4 ?F (36.9 ?C)-98.8 ?F (37.1 ?C)] 98.8 ?F (37.1 ?C)Pulse:  [86-100] 87Resp:  [20] 20BP: (99-135)/(65-89) 99/65SpO2:  [95 %-100 %] 95 %Device (Oxygen Therapy): room airI/O's:Intake/Output Summary (Last 24 hours) at 03/08/2022 2130Last data filed at 03/08/2022 1800Gross per 24 hour Intake 2071 ml Output 1850 ml Net 221 ml Physical Exam General: comfortable, NADPsych/Neuro: awake, alert, answers questions with full sentences but soft voice. Oriented to self and hospital only. Follows commands. Wiggles toes bilaterally but does not otherwise move legs.CV: RRR, no m/r/gPulm: Breathing comfortably, no increased WOBAbd: Soft, non-tender, non-distended. PEG and ostomy bag. Surgical scar.Extremities: No edema. Feet offloaded in boots.Current Meds:[Held by provider] apixaban, 5 mg, Q12Hchlorhexidine gluconate, 15 mL, BIDElemental iron, 45 mg, BIDenoxaparin (LOVENOX) treatment, 80 mg, Q12Hfamotidine, 20 mg, Dailyfolic acid, 1 mg, Dailyniacin, 100 mg, Nightlypyridoxine (vitamin B6), 100 mg, Daily[Held by provider] rosuvastatin, 10 mg, Nightlysodium chloride, 3 mL, Q8Hthiamine, 100 mg, BID Diagnostics:Recent Labs Lab 03/13/230440 03/14/230556 WBC 5.3 6.8 HGB 11.1* 9.8* HCT 37.70 31.60* PLT 385 335 Recent Labs Lab 03/15/230532 03/16/230630 03/18/230837 NA 136 140 140 K 3.8 3.4 4.0 CL 107 111* 108* CO2 21 23 23  ANIONGAP 8 6* 9 BUN 13 9 7* CREATININE 0.22* 0.27* 0.22* CALCIUM 10.5* 10.2 10.7* Recent Labs Lab 03/18/230837 PHOS 2.4 I have reviewed new labs and imaging over the last 24 hours with the following notable findings:- Corrected calcium 12.4 on 3/14 --> 11.6 --> 11.2 --> 11.3 on 3/18Assessment: Ms Hagberg is a 66yo female with a history of Rheumatoid arthritis and recent prolonged hospitalization for 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 Found to be febrile without clear infectious source found, fevers resolved. Extensive work-up of encephalopathy and leg weakness unremarkable. Course c/b elevated LFTs which resolved, likely 2/2 DILI from ceftriaxone or Zosyn. She has been stable and awaiting LTC placement, but with ongoing significant hypercalcemia.Plan: Hypercalcemia chronic, but notable (12.4 corrected on 3/14)- PTH normal (28)- Prior work-up including PTH-rP (9), vitamin D testing, TSH, random cortisol unrevealing- Possibly d/t immobility or primary hyperparathyroidism; sent additional PTH testing with mineral metabolism level (send-out test)- Started IVF @ 150cc/hr on 3/14, calcium improving/stable, continue for now- Continue FWF via G-tube- Daily calcium/albumin- Appreciate Endocrine recs re: possible additional therapy pending PTH results Acute encephalopathyLE weaknessUnclear etiology, possibly prolonged delirium iso hospitalization from Nov 2022 - currentMRI brain, MRI total spine unremarkableLP unremarkableParaneoplastic and autoimmune encephalopathy panels negativeOverall somewhat improved cognitive status during admission, remains alert and oriented x 1-2Neurology rec'd EMG to complete work-up of LE weakness but pt declined; Neurology does not recommend any further testing and signed off 3/8/23Elevated liver enzymes, resolvedAbnormal imaging findings - ?Multiple peritoneal nodulesLiver chem normal on admission but new elevation after 3d of admissionWork-up showed elevated IgG, smooth muscle antibodies c/f possible autoimmune hepatitisInitial concern that imaging showed peritoneal nodules ?carcinomatosisOn review of imaging, nodules improving, more likely to be continued resolution of debris from perforationLiver tests resolved without intervention, so most likely DILI from antibiotics (ceftriaxone and Zosyn)-- Check LFTs weekly to ensure she is not evolving autoimmune hepatitis given multiple positive autoimmune markersR lateral foot and L medial eminence deep tissue injuriesStableXray feet with degenerative changes but no acute findings	Podiatry following - no dressing requirements, WBAT to BLE 	No need for abx  at this time Elevated LFC ratioIron deficiency anemiaHematology signed off - elevated FLC ratio felt to be reactive, plasma cell disorder unlikely Fevers and leukocytosis resolvedNo clear source identified, possibly 2/2 DVT vs autoimmune?S/p ceftriaxone 2/21 and Zosyn 2/21-2/25Chronic conditionsRheumatoid Arthritis - has been off of MTX since November/acute illness, rheumatology signing off current ams/weakness not felt to be related to her RA, follow up outpatient Bilateral LE DVT - ?provoked from recent surgery, cont lovenox, can resume apixaban at discharge# Hospital Issues- F: none- E: replete PRN- N: Tube feeds- GI: bowel regimen- PPx: lovenox - Code Status:Full ACLS# Dispo: LTC, family looking into T19 application Medical decision making for this patient was moderateDiet Tube Feed No TrayVTE PPx: therapeutic anticoagulationMobility: Highest Level of mobility - ACTUAL: Mobillty Level 1, Lying in bed , AM PAC < 6Communication: updated sister via telephone 3/15Dispo: LTC, family working on T19 application, not medically ready (hypercalcemia is outstanding issue)Last Covid swab: Lab Results Component Value Date  SARSCOV2 Negative 02/23/2022  Full ACLSDecision making for this encounter was moderate.Signed:Isao Seltzer A Caterine Mcmeans, MD3/18/2023

## 2022-03-10 LAB — CBC WITH AUTO DIFFERENTIAL
BKR WAM ABSOLUTE IMMATURE GRANULOCYTES.: 0.01 x 1000/ÂµL (ref 0.00–0.30)
BKR WAM ABSOLUTE LYMPHOCYTE COUNT.: 1.73 x 1000/ÂµL (ref 0.60–3.70)
BKR WAM ABSOLUTE NRBC (2 DEC): 0 x 1000/ÂµL (ref 0.00–1.00)
BKR WAM ANALYZER ANC: 2.83 x 1000/ÂµL (ref 2.00–7.60)
BKR WAM BASOPHIL ABSOLUTE COUNT.: 0.04 x 1000/ÂµL (ref 0.00–1.00)
BKR WAM BASOPHILS: 0.7 % (ref 0.0–1.4)
BKR WAM EOSINOPHIL ABSOLUTE COUNT.: 0.32 x 1000/ÂµL (ref 0.00–1.00)
BKR WAM EOSINOPHILS: 6 % — ABNORMAL HIGH (ref 0.0–5.0)
BKR WAM HEMATOCRIT (2 DEC): 29.5 % — ABNORMAL LOW (ref 35.00–45.00)
BKR WAM HEMOGLOBIN: 8.8 g/dL — ABNORMAL LOW (ref 11.7–15.5)
BKR WAM IMMATURE GRANULOCYTES: 0.2 % (ref 0.0–1.0)
BKR WAM LYMPHOCYTES: 32.2 % (ref 17.0–50.0)
BKR WAM MCH (PG): 26.4 pg — ABNORMAL LOW (ref 27.0–33.0)
BKR WAM MCHC: 29.8 g/dL — ABNORMAL LOW (ref 31.0–36.0)
BKR WAM MCV: 88.6 fL (ref 80.0–100.0)
BKR WAM MONOCYTE ABSOLUTE COUNT.: 0.44 x 1000/ÂµL (ref 0.00–1.00)
BKR WAM MONOCYTES: 8.2 % (ref 4.0–12.0)
BKR WAM MPV: 10.4 fL (ref 8.0–12.0)
BKR WAM NEUTROPHILS: 52.7 % (ref 39.0–72.0)
BKR WAM NUCLEATED RED BLOOD CELLS: 0 % (ref 0.0–1.0)
BKR WAM PLATELETS: 293 x1000/ÂµL (ref 150–420)
BKR WAM RDW-CV: 17.7 % — ABNORMAL HIGH (ref 11.0–15.0)
BKR WAM RED BLOOD CELL COUNT.: 3.33 M/ÂµL — ABNORMAL LOW (ref 4.00–6.00)
BKR WAM WHITE BLOOD CELL COUNT: 5.4 x1000/ÂµL (ref 4.0–11.0)

## 2022-03-10 LAB — COMPREHENSIVE METABOLIC PANEL
BKR A/G RATIO: 0.6 — ABNORMAL LOW (ref 1.0–2.2)
BKR ALANINE AMINOTRANSFERASE (ALT): 16 U/L (ref 10–35)
BKR ALBUMIN: 2.5 g/dL — ABNORMAL LOW (ref 3.6–4.9)
BKR ALKALINE PHOSPHATASE: 75 U/L (ref 9–122)
BKR ANION GAP: 8 (ref 7–17)
BKR ASPARTATE AMINOTRANSFERASE (AST): 27 U/L (ref 10–35)
BKR AST/ALT RATIO: 1.7
BKR BILIRUBIN TOTAL: <0.2 mg/dL (ref <=1.2)
BKR BLOOD UREA NITROGEN: 8 mg/dL (ref 8–23)
BKR BUN / CREAT RATIO: 38.1 — ABNORMAL HIGH (ref 8.0–23.0)
BKR CALCIUM: 9.6 mg/dL (ref 8.8–10.2)
BKR CHLORIDE: 110 mmol/L — ABNORMAL HIGH (ref 98–107)
BKR CO2: 22 mmol/L (ref 20–30)
BKR CREATININE: 0.21 mg/dL — ABNORMAL LOW (ref 0.40–1.30)
BKR EGFR, CREATININE (CKD-EPI 2021): >60 mL/min/{1.73_m2} (ref >=60)
BKR GLOBULIN: 4.5 g/dL — ABNORMAL HIGH (ref 2.3–3.5)
BKR GLUCOSE: 94 mg/dL (ref 70–100)
BKR POTASSIUM: 3.1 mmol/L — ABNORMAL LOW (ref 3.3–5.3)
BKR PROTEIN TOTAL: 7 g/dL (ref 6.6–8.7)
BKR SODIUM: 140 mmol/L (ref 136–144)

## 2022-03-10 MED ORDER — POTASSIUM BICARBONATE-CITRIC ACID 25 MEQ EFFERVESCENT TABLET
25 mEq | Freq: Once | GASTROSTOMY | Status: CP
Start: 2022-03-10 — End: ?
  Administered 2022-03-10: 14:00:00 25 mEq via GASTROSTOMY

## 2022-03-10 NOTE — Progress Notes
[facility] Hospital[facility]	Podiatry Progress NoteDate: 3/20/2023Patient Name: Rielyn Krupinski: [id number] Attending Provider: Thurnell Lose, MDCC: Bilateral foot woundsSubjective: Pt seen at bedside this AM in NAD with attending. NAEO.  Patient in good spirits this morning.PMH: PSH: No past medical history on file. No past surgical history on file. Social History: Family History: Social History Tobacco Use ? Smoking status: Not on file ? Smokeless tobacco: Not on file Substance Use Topics ? Alcohol use: Not on file  No family history on file. Allergies: Patient has no known allergies.Review of Systems: Unable to assess 2/2 AMSVitals: Vitals:  03/10/22 0827 BP: (!) 145/82 Pulse: 90 Resp: 17 Temp: 98.2 ?F (36.8 ?C) Physical Exam: Gen: NADHead: NormocephalicPsych: AAO x 2Chest: CTAB, no r/r/wCardio: RRR, S1, S2, no m/r/gAbd: Soft NT, ND, +bsVascular: DP/PT pulses palpable. Normal CRT to all digits. Foot is warm to touch. Pedal hair growth is absentNeuro: Unable to assess MSK: Unable to ROM digits when asked, unable to participate in MMTDerm: Deep tissue injury noted to the Right lateral foot without any open lesions.  Previous Superficial deep tissue injury at the right medial eminence has completely healed.Labs: Lab Results Component Value Date  WBC 5.4 03/10/2022  HGB 8.8 (L) 03/10/2022  HCT 29.50 (L) 03/10/2022  CREATININE 0.21 (L) 03/10/2022  BUN 8 03/10/2022  CO2 22 03/10/2022  PLT 293 03/10/2022  INR 1.10 02/18/2022  GLU 94 03/10/2022  K 3.1 (L) 03/10/2022  NA 140 03/10/2022  CL 110 (H) 03/10/2022  ALT 16 03/10/2022  AST 27 03/10/2022  TSH 1.670 02/17/2022  HSCRP 10.7 (H) 02/19/2022  SEDRATE 98 (H) 02/19/2022 Imaging: XR Foot Bilateral AP Lateral and ObliqueResult Date: 3/7/2023AP, LATERAL AND OBLIQUE BILATERAL FOOT X-RAY Clinical Information: Baseline assessment. Assess for erosions. Comparison: None Findings: Left foot: The overall bony mineralization appears qualitatively diminished. There is evidence of joint space narrowing indicative of arthropathy involving the interphalangeal joint and metatarsal phalangeal joint of the first digit as well as the DIP and PIP joints of the remaining digits. There is mild hallux valgus as well as bunion formation. The moderate degenerative changes are noted in the midfoot. There is no acute osseous injury. Right foot: The overall bony mineralization appears qualitatively diminished. There is evidence of joint space narrowing indicative of arthropathy involving the interphalangeal joint and metatarsal phalangeal joint of the first digit as well as the DIP and PIP joints of the remaining digits. There is moderate to severe hallux valgus as well as bunion formation. The moderate degenerative changes are noted in the midfoot. There is no acute osseous injury. 1. Diffuse degenerative changes without evidence of acute osseous injury. There is no evidence of bony erosion. Reported And Signed By: Merdis Delay, MD  [facility]ical [facility] Micro/Path: None Assessment/Plan: Danielle [name] is a 66 y.o. female with Right lateral foot and and resolved Left medial eminence deep tissue injuries, stable. No open wounds or SOI. ?- Discussed b/l foot Xrays with no erosions noted- no dressing needed on the left medial eminence, would recommend continuing  With a Betadine with overlying DSD to the right DTI- Recommend WBAT to BLE- Antibiotics per primary team - none indicated per podiatry standpointDispo: No indication for podiatric surgical intervention at this time.  Patient may follow-up with Dr. Charice Zuno within 3-4 weeks of discharge.  Podiatry will continue to follow peripherally while in house. Please contact the resident on call with any acute changes, new issues, or questions.Seen with attending, Dr. Darral Dash page on-call podiatry resident with questions:?	SEE AMION FOR CALL  SCHEDULE Donne Hazel, DPM PGY-2 [facility] Podiatric SurgeryElectronically Signed by Donne Hazel, DPM, March 20, 2023I saw and evaluated Danielle [name] with the resident. I agree with the findings and the plan of care as documented in the resident note.Electronically Signed by Donn Pierini, DPM, March 10, 2022

## 2022-03-10 NOTE — Plan of Care
Danielle [name]				Location: EP [id number] y.o., female				Attending: Thurnell Lose, MD	Admit Date: 02/11/2022			[name] LOS: 27 days NUTRITION NOTE:Diet order: Pivot 1.5: bolus 1 cartons TID (@9a , 1p, 5p) and 1.5 cartons @9p  - total 5.5 cartons/day --> actual intake is 4.5 cartons/day and provides 1.0665L, 1600 kcals, 100g protein, free waterEstimated Needs: 1950-2340 kcals and 94-117g proteinBriefly chart reviewed for TF regimen update. For full assessment, please see RD note from 3/16. Goal volume of 5.5 cartons/day was recommended. Current order reflects a total of 4.5 cartons/day.Interventions/Recommendations:- per protocol RD will adjust order to:	-Pivot 1.5 @ 1.5 carton bolus TID (@9am , 1pm, 5pm) with 1 carton at 9pm to total 5.5 cartons daily.		-at goal, enteral nutrition provides 1.3L, 1955kcals, 121g protein, and of free water		-additional of FWF per team discretion. Pt is currently receiving of free water through flushes ( q6hrs). Na+ WNL.Discussed with Elton Sin, MDElectronically signed by Joslyn Devon, March 10, 2022 Co-signed by Woodfin Ganja, RDX coverage for Sherrilee Gilles, RD; [facility]: [phone] note: Nutrition is a consult only service on weekends and holidays.  Please enter a consult in EPIC if assistance is needed on a weekend or holiday or via [facility] [phone] to contact the covering RD.

## 2022-03-10 NOTE — Plan of Care
Plan of Care Overview/ Patient Status    1900-0700Patient alert and oriented to self. Patient calm and cooperative with care. VSS on RA. Lung sounds diminished. Abdomen soft and non tender. All scheduled medication given as ordered, meds via PEG . PRN tylenol given for generalized pain. Bolus feed ( pivot 1.5) tolerated well and q4h 400 ml flushed given as order. PEG site red with yellow drainage, area cleansed w/ NS and new dressing applied. Ileostomy with good output, appliance CDI. Oral care provided x 2. Dressing to BLE CDI, changed is shift. Podus boots in place. Patient turned and reposition q2h. No issues overnight, Will continue to monitor and follow POC. Please see flow sheet and MAR for more details.

## 2022-03-10 NOTE — Progress Notes
[facility]	 [facility]	Medicine Progress NoteAttending Provider: Donald Siva Berkley Harvey, MDPCP: Pcp, Does Not Have A NoneHospital Day: 101 Patient examined by me: 3/20/2023Subjective: CC: Fever, unspecified fever cause Interim History: Patient seen and examined at bedside. Patient feeling better. Ill appearing. No complaints; still reported BLE weakness & needing a new place to stay. Pleasantly confused.Overnight Events: NoneReview of Systems Unable to perform ROS: Medical condition Review of Allergies/Meds/Hx: I have reviewed the patient's: allergies, current scheduled medications, current infusions, current prn medications, past medical history, past surgical history, family history, social history and prior to admission medications.Scheduled Meds:Current Facility-Administered Medications Medication Dose Route Frequency Provider Last Rate Last Admin ? [Held by provider] apixaban (ELIQUIS) tablet 5 mg  5 mg Per G Tube Q12H Littie Deeds, MD     ? chlorhexidine gluconate (PERIDEX) 0.12 % solution 15 mL  15 mL Mouth/Throat BID Dorisann Frames, MD   15 mL at 03/10/22 1020 ? Elemental iron 60 mg (300 mg Ferrous sulfate) / 5 mL oral liquid 45 mg  45 mg Per G Tube BID Dorisann Frames, MD   45 mg at 03/10/22 1019 ? enoxaparin (LOVENOX) injection 80 mg  80 mg Subcutaneous Q12H Chaar, Abdelkader, MD   80 mg at 03/10/22 1019 ? famotidine (PEPCID) suspension (8 mg/mL) 20 mg  20 mg Per G Tube Daily Dorisann Frames, MD   20 mg at 03/10/22 1020 ? folic acid (FOLVITE) tablet 1 mg  1 mg Per G Tube Daily Dorisann Frames, MD   1 mg at 03/10/22 1019 ? niacin Immediate Release tablet 100 mg  100 mg Per G Tube Nightly Dorisann Frames, MD   100 mg at 03/09/22 2123 ? pyridoxine (vitamin B6) (B-6) tablet 100 mg  100 mg Per G Tube Daily Dorisann Frames, MD   100 mg at 03/10/22 1019 ? [Held by provider] rosuvastatin (CRESTOR) tablet 10 mg  10 mg Per G Tube Nightly Dorisann Frames, MD   10 mg at 02/14/22 2245 ? sodium chloride 0.9 % flush 3 mL  3 mL IV Push Q8H Dorisann Frames, MD   3 mL at 03/10/22 1531 ? thiamine (VITAMIN B1) tablet 100 mg  100 mg Per G Tube BID Dorisann Frames, MD   100 mg at 03/10/22 1018 Continuous Infusions:? sodium chloride 100 mL/hr (03/09/22 1128) PRN Meds:.acetaminophen, dextrose (GLUCOSE) oral liquid 15 g **OR** fruit juice **OR** skim milk, dextrose (GLUCOSE) oral liquid 30 g **OR** fruit juice, dextrose injection, dextrose injection, glucagon, sodium chlorideCurrent Facility-Administered Medications Medication Dose Route Frequency Last Rate ? acetaminophen  650 mg Per G Tube Q6H PRN   ? [Held by provider] apixaban  5 mg Per G Tube Q12H   ? chlorhexidine gluconate  15 mL Mouth/Throat BID   ? dextrose (GLUCOSE) oral liquid 15 g  15 g Oral Q15 MIN PRN    Or ? fruit juice  120 mL Oral Q15 MIN PRN    Or ? skim milk  240 mL Oral Q15 MIN PRN   ? dextrose (GLUCOSE) oral liquid 30 g  30 g Oral Q15 MIN PRN    Or ? fruit juice  240 mL Oral Q15 MIN PRN   ? dextrose injection  12.5 g Intravenous Q15 MIN PRN   ? dextrose injection  25 g Intravenous Q15 MIN PRN   ? Elemental iron  45 mg Per G Tube BID   ?  enoxaparin (LOVENOX) treatment  80 mg Subcutaneous Q12H   ? famotidine  20 mg Per G Tube Daily   ? folic acid  1 mg Per G Tube Daily   ? glucagon  1 mg Intramuscular Once PRN   ? niacin  100 mg Per G Tube Nightly   ? pyridoxine (vitamin B6)  100 mg Per G Tube Daily   ? [Held by provider] rosuvastatin  10 mg Per G Tube Nightly   ? sodium chloride  3 mL IV Push Q8H   ? sodium chloride  3 mL IV Push PRN for Line Care   ? sodium chloride  100 mL/hr Intravenous Continuous 100 mL/hr (03/09/22 1128) ? thiamine  100 mg Per G Tube BID   Objective: Vitals: Patient Vitals for the past 24 hrs: Temp Temp src Pulse BP NIBP MAP (mmHg) Resp SpO2 03/10/22 1602 98.8 ?F (37.1 ?C) Oral (!) 102 136/72 94 17 100 % 03/10/22 0827 98.2 ?F (36.8 ?C) Oral 90 (!) 145/82 103 17 97 % 03/10/22 0006 97.6 ?F (36.4 ?C) Oral (!) 110 138/78 98 18 97 % Physical Exam:  Constitutional: oriented to person, and no acute distress. Moderately confused.HEENT: Head: NCAT. Eyes: EOMI. PERRL. Neck: Neck supple. Cardiovascular: RRRS1S2.  No murmursPulmonary/Chest: BS +. no wheezes. no rales.Abdominal: Soft. BS +. Non-tender, Non-distended. Ileostomy-no blood. PEG site c/d/i.GU: PurewickMusculoskeletal: Normal ROM. Neurological: alert and oriented to person. Non focal--BLE weakness & slight drop foot.Extremities: BLE + edema.Skin: No rashesLabs:Recent Labs   03/18/230837 03/19/230451 03/20/230537 NA 140 140 140 K 4.0 3.4 3.1* CL 108* 111* 110* CO2 23 22 22  BUN 7* 10 8 CREATININE 0.22* 0.21* 0.21* Recent Labs   03/19/230451 03/20/230537 WBC 5.5 5.4 HGB 8.6* 8.8* HCT 28.70* 29.50* PLT 262 293 Recent Labs   03/20/230537 ALT 16 AST 27 BILITOT <0.2 Micro: Covid negativeProcedures:None IV Access: PIVDiagnostics:No new radiology.No results found for this or any previous visit.ECG/Tele Events: I have reviewed the patient's ECG as resulted in the EMR.Assessment: Assessment: 66 y.o. 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 Admitted 2/24 for LTAC placement and persistent encephalopathy. Found to be febrile without clear infectious source found, fevers resolved. Extensive work-up of encephalopathy and leg weakness unremarkable. Course c/b elevated LFTs which resolved, likely 2/2 DILI from ceftriaxone or Zosyn. Stable and awaiting LTC placement, but with ongoing significant hypercalcemia. Mental status appears overall stable/improving.Plan: Hypercalcemia:Acute on chronic, but notable (12.4 corrected on 3/14)- PTH normal (28); work-up including PTH-rP (9), vitamin D testing, TSH, random cortisol unrevealing- Possibly d/t immobility or primary hyperparathyroidism; sent additional PTH testing with mineral metabolism level (send-out test)- adjusted IVF @ 150cc/hr (3/14)--> decrease to 100cc/hr for now- Continue FWF via G-tube- Daily calcium/albumin- Endocrine consult  recs re: possible additional therapy pending PTH results Acute encephalopathy:LE weakness:-Unclear etiology, possibly prolonged delirium iso hospitalization from Nov 2022 - currentMRI brain, MRI total spine unremarkable-LP unremarkable-Paraneoplastic and autoimmune encephalopathy panels negative-Overall somewhat improved cognitive status during admission, remains alert and oriented x 1-2-Neurology rec'd EMG to complete work-up of LE weakness but pt declined; Neurology does not recommend any further testing and signed off 02/26/22?Elevated LFTs:-resolved-ABDCT- ?Multiple peritoneal nodules-LFTs normal on admission but new elevation after 3d of admission-Work-up showed elevated IgG, smooth muscle antibodies c/f possible autoimmune hepatitisInitial concern that imaging showed peritoneal nodules ?carcinomatosis-On review of imaging, nodules improving, more likely to be continued resolution of debris from perforation-Liver tests resolved without intervention, so most likely DILI from antibiotics (ceftriaxone and Zosyn)-  Check LFTs weekly to ensure she is not evolving autoimmune hepatitis given multiple positive autoimmune markers?R lateral foot and L medial eminence deep tissue injuries:-Stable-Xray feet with degenerative changes but no acute findings- Podiatry following - no dressing requirements, WBAT to BLE.No?indication for podiatric surgical intervention at this time.  Patient may follow-up with Dr. Gazes within 3-4 weeks of discharge. - No need for abx at this time Hx Bilateral DVTs:- Apixaban 5mg  BID-HELD-c/w Lovenox 80 mg q12H?Elevated LFC ratio:Iron deficiency anemia:-Hematology consult &  signed off - elevated FLC ratio felt to be reactive, plasma cell disorder unlikely ?Transient Fevers and leukocytosis:- resolved-No clear source identified, possibly 2/2 DVT vs autoimmune?-S/p CTX 2/21 and Zosyn 2/21-2/25?Chronic conditions:-Rheumatoid Arthritis - has been off of MTX since November/acute illness, rheumatology signing off current ams/weakness not felt to be related to her RA, follow up outpatient -Bilateral LE DVT - ?provoked from recent surgery, cont lovenox, can resume apixaban at discharge?Hospital Issues:- F: none- E: replete PRN- N: Tube feeds- GI: bowel regimen- PPx: Lovenox 80 mg q12H (s/p Eliquis)- Code Status:Full ACLS?Disposition: LTC, family looking into T19 application FEN: diet, K>4, Mg>2 Lines: PIV Tubes: NoneMobility: pendingPT/OT: pending Disposition: pendingMedication Reconciliation: In ProgressCommunication: pendingSigned:Graciana Sessa Vivi Martens, MD, MSc  Communication via [facility] Hospitalist Service3/20/2023, 5:11 PMHigh MDM complexity : time spent obtaining history from patient or surrogate, review of medical records/prior chart documentation , examining/evaluating patient, ordering or performing treatments/interventions; ordering/reviewing laboratory studies, radiographic studies, pulse oximetry; re-evaluation of patient's condition and development of treatment plan, discussions with consultants, evaluation of patient's response to treatment. With 50% of time spent in care follow-up/care coordination, plan of care discussion with consulting specialities, and with the patient and/or patient's family/HCPOA/Surrogate Code Status Full ACLS DVT Prophylaxis Lovenox ADMIT MED REC In Progress PCP Pcp, Does Not Have A None Emergency contact Extended Emergency Contact InformationPrimary Emergency Contact: McCulley,ReneeHome Phone: [phone] Disposition TBD

## 2022-03-10 NOTE — Plan of Care
Plan of Care Overview/ Patient Status    Problem: Adult Inpatient Plan of CareGoal: Readiness for Transition of CareOutcome: Interventions implemented as appropriate LOS note 27 daysPt discussed during TC rounds, remains not medically ready for discharge due to hypercalcemia. Team working on adjusting tube feeds. [name] (sister) continues to work on obtaining conservatorship and T-19 for LTC, T-19 application has been sent via mail & [name] in contact w/ assigned case worker. Elinor Parkinson, RN, MSNCare ManagerMHB: [phone]

## 2022-03-10 NOTE — Progress Notes
Daily Progress NoteHospital Medicine ServiceAttending Provider: Janann Colonel, MD [phone] Day #26Patient seen and examined on 03/09/2022.CC: Fever, unspecified fever cause24-hr Events: - Calcium improved/stable with NS @ 150cc/hr Subjective: - No acute complaints todayObjective: Vitals:Temp:  [97.5 ?F (36.4 ?C)-98.6 ?F (37 ?C)] 98 ?F (36.7 ?C)Pulse:  [89-95] 89Resp:  [18-20] 18BP: (127-137)/(71-77) 127/71SpO2:  [98 %-100 %] 100 %Device (Oxygen Therapy): room airI/O's:Intake/Output Summary (Last 24 hours) at 03/09/2022 2038Last data filed at 03/09/2022 1710Gross per 24 hour Intake 3006 ml Output 2725 ml Net 281 ml Physical Exam General: comfortable, NADPsych/Neuro: awake, alert, bright, fully participating in conversation today. Oriented to self and hospital only. Follows all my commands, and wiggles her toes bilaterally. Lifts both arms but right arm weaker than left. Moves her left leg a little off the bed to command, able to move her right arm but definitely less.CV: RRR, no m/r/gPulm: Breathing comfortably, no increased WOBAbd: Soft, non-tender, non-distended. PEG and ostomy bag. Surgical scar.Extremities: No edema. Feet offloaded in boots.Current Meds:[Held by provider] apixaban, 5 mg, Q12Hchlorhexidine gluconate, 15 mL, BIDElemental iron, 45 mg, BIDenoxaparin (LOVENOX) treatment, 80 mg, Q12Hfamotidine, 20 mg, Dailyfolic acid, 1 mg, Dailyniacin, 100 mg, Nightlypyridoxine (vitamin B6), 100 mg, Daily[Held by provider] rosuvastatin, 10 mg, Nightlysodium chloride, 3 mL, Q8Hthiamine, 100 mg, BID Diagnostics:Recent Labs Lab 03/13/230440 03/14/230556 03/19/230451 WBC 5.3 6.8 5.5 HGB 11.1* 9.8* 8.6* HCT 37.70 31.60* 28.70* PLT 385 335 262 Recent Labs Lab 03/16/230630 03/18/230837 03/19/230451 NA 140 140 140 K 3.4 4.0 3.4 CL 111* 108* 111* CO2 23 23 22  ANIONGAP 6* 9 7 BUN 9 7* 10 CREATININE 0.27* 0.22* 0.21* CALCIUM 10.2 10.7* 9.6 Recent Labs Lab 03/19/230451 MG 1.6* PHOS 2.5 I have reviewed new labs and imaging over the last 24 hours with the following notable findings:- Corrected calcium 12.4 on 3/14 --> 11.6 --> 11.2 --> 11.3 --> 10.8 on 3/19Assessment: Danielle [name] is a 66yo female with a history of Rheumatoid arthritis and recent prolonged hospitalization for 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 Found to be febrile without clear infectious source found, fevers resolved. Extensive work-up of encephalopathy and leg weakness unremarkable. Course c/b elevated LFTs which resolved, likely 2/2 DILI from ceftriaxone or Zosyn. She has been stable and awaiting LTC placement, but with ongoing significant hypercalcemia. Her mental status appears overall stable/improving.Plan: Hypercalcemia chronic, but notable (12.4 corrected on 3/14)- PTH normal (28)- Prior work-up including PTH-rP (9), vitamin D testing, TSH, random cortisol unrevealing- Possibly d/t immobility or primary hyperparathyroidism; sent additional PTH testing with mineral metabolism level (send-out test)- Started IVF @ 150cc/hr on 3/14, calcium improving/stable, will decrease to 100cc/hr for now- Continue FWF via G-tube- Daily calcium/albumin- Appreciate Endocrine recs re: possible additional therapy pending PTH results Acute encephalopathyLE weaknessUnclear etiology, possibly prolonged delirium iso hospitalization from Nov 2022 - currentMRI brain, MRI total spine unremarkableLP unremarkableParaneoplastic and autoimmune encephalopathy panels negativeOverall somewhat improved cognitive status during admission, remains alert and oriented x 1-2Neurology rec'd EMG to complete work-up of LE weakness but pt declined; Neurology does not recommend any further testing and signed off 3/8/23Elevated liver enzymes, resolvedAbnormal imaging findings - ?Multiple peritoneal nodulesLiver chem normal on admission but new elevation after 3d of admissionWork-up showed elevated IgG, smooth muscle antibodies c/f possible autoimmune hepatitisInitial concern that imaging showed peritoneal nodules ?carcinomatosisOn review of imaging, nodules improving, more likely to be continued resolution of debris from perforationLiver tests resolved without intervention, so most likely DILI from antibiotics (ceftriaxone and Zosyn)-- Check LFTs  weekly to ensure she is not evolving autoimmune hepatitis given multiple positive autoimmune markersR lateral foot and L medial eminence deep tissue injuriesStableXray feet with degenerative changes but no acute findings	Podiatry following - no dressing requirements, WBAT to BLE 	No need for abx at this time Elevated LFC ratioIron deficiency anemiaHematology signed off - elevated FLC ratio felt to be reactive, plasma cell disorder unlikely Fevers and leukocytosis resolvedNo clear source identified, possibly 2/2 DVT vs autoimmune?S/p ceftriaxone 2/21 and Zosyn 2/21-2/25Chronic conditionsRheumatoid Arthritis - has been off of MTX since November/acute illness, rheumatology signing off current ams/weakness not felt to be related to her RA, follow up outpatient Bilateral LE DVT - ?provoked from recent surgery, cont lovenox, can resume apixaban at discharge# Hospital Issues- F: none- E: replete PRN- N: Tube feeds- GI: bowel regimen- PPx: lovenox - Code Status:Full ACLS# Dispo: LTC, family looking into T19 application Medical decision making for this patient was moderateDiet Tube Feed No TrayVTE PPx: therapeutic anticoagulationMobility: Highest Level of mobility - ACTUAL: Mobillty Level 1, Lying in bed , AM PAC < 6Communication: updated sister via telephone 3/15Dispo: LTC, family working on T19 application, not medically ready (hypercalcemia is outstanding issue)Last Covid swab: Lab Results Component Value Date  SARSCOV2 Negative 02/23/2022  Full ACLSDecision making for this encounter was moderate.Signed:Ellyce Lafevers A Kamara Allan, MD3/19/2023

## 2022-03-10 NOTE — Plan of Care
Plan of Care Overview/ Patient Status    0700-1900Alert to self only, lungs clear upper & diminished bases, no cough, on RA. Abd. soft NT/ND, BS + x 4; Ileostomy in place patent and draining brown liquid stool. PEG tube in place patent and flushes well with no issues;  NPO,TF bolus Pivot as ordered, FWF 400 cc as per ordered. Wounds' dressings CDI. Blue heel lift boots in place, off for care and back on. T&R A x 2 in bed, propped with wedge and pillows. Oral care done 3 x this shift.Magnesium repleted as per orders this shift. Sister & brother visited this shift; sister washed and styled pt.'s hair.1804 pt. removed PIV on dorsum of right hand & states I am sorry I took it out. Site asymptomatic. New PIV initiated, site camouflaged with kerlex.

## 2022-03-11 LAB — BASIC METABOLIC PANEL
BKR ANION GAP: 9 (ref 7–17)
BKR BLOOD UREA NITROGEN: 10 mg/dL (ref 8–23)
BKR BUN / CREAT RATIO: 43.5 — ABNORMAL HIGH (ref 8.0–23.0)
BKR CALCIUM: 10.1 mg/dL (ref 8.8–10.2)
BKR CHLORIDE: 108 mmol/L — ABNORMAL HIGH (ref 98–107)
BKR CO2: 23 mmol/L (ref 20–30)
BKR CREATININE: 0.23 mg/dL — ABNORMAL LOW (ref 0.40–1.30)
BKR EGFR, CREATININE (CKD-EPI 2021): >60 mL/min/{1.73_m2} (ref >=60)
BKR GLUCOSE: 93 mg/dL (ref 70–100)
BKR POTASSIUM: 3.2 mmol/L — ABNORMAL LOW (ref 3.3–5.3)
BKR SODIUM: 140 mmol/L (ref 136–144)

## 2022-03-11 LAB — CBC WITHOUT DIFFERENTIAL
BKR WAM ANALYZER ANC: 3.12 x 1000/ÂµL (ref 2.00–7.60)
BKR WAM HEMATOCRIT (2 DEC): 33.6 % — ABNORMAL LOW (ref 35.00–45.00)
BKR WAM HEMOGLOBIN: 10 g/dL — ABNORMAL LOW (ref 11.7–15.5)
BKR WAM MCH (PG): 26.8 pg — ABNORMAL LOW (ref 27.0–33.0)
BKR WAM MCHC: 29.8 g/dL — ABNORMAL LOW (ref 31.0–36.0)
BKR WAM MCV: 90.1 fL (ref 80.0–100.0)
BKR WAM MPV: 10.3 fL (ref 8.0–12.0)
BKR WAM PLATELETS: 297 x1000/ÂµL (ref 150–420)
BKR WAM RDW-CV: 17.9 % — ABNORMAL HIGH (ref 11.0–15.0)
BKR WAM RED BLOOD CELL COUNT.: 3.73 M/ÂµL — ABNORMAL LOW (ref 4.00–6.00)
BKR WAM WHITE BLOOD CELL COUNT: 5.8 x1000/ÂµL (ref 4.0–11.0)

## 2022-03-11 LAB — CALCIUM, IONIZED, WHOLE BLOOD (BH GH LMW YH): BKR CALCIUM, IONIZED, WHOLE BLOOD: 5.58 mg/dL — ABNORMAL HIGH (ref 4.60–5.08)

## 2022-03-11 LAB — MAGNESIUM: BKR MAGNESIUM: 1.8 mg/dL (ref 1.7–2.4)

## 2022-03-11 MED ORDER — ONDANSETRON HCL (PF) 4 MG/2 ML INJECTION SOLUTION
42 mg/2 mL | Freq: Once | INTRAVENOUS | Status: DC
Start: 2022-03-11 — End: 2022-03-11

## 2022-03-11 MED ORDER — ONDANSETRON HCL (PF) 4 MG/2 ML INJECTION SOLUTION
42 mg/2 mL | Freq: Four times a day (QID) | INTRAVENOUS | Status: DC | PRN
Start: 2022-03-11 — End: 2022-06-30

## 2022-03-11 MED ORDER — ZOLEDRONIC ACID IVPB
Freq: Once | INTRAVENOUS | Status: CP
Start: 2022-03-11 — End: ?
  Administered 2022-03-11: 22:00:00 100.000 mL/h via INTRAVENOUS

## 2022-03-11 MED ORDER — POTASSIUM BICARBONATE-CITRIC ACID 25 MEQ EFFERVESCENT TABLET
25 mEq | Freq: Once | GASTROSTOMY | Status: CP
Start: 2022-03-11 — End: ?
  Administered 2022-03-11: 15:00:00 25 mEq via GASTROSTOMY

## 2022-03-11 MED ORDER — ONDANSETRON 4 MG DISINTEGRATING TABLET
4 mg | Freq: Four times a day (QID) | ORAL | Status: DC | PRN
Start: 2022-03-11 — End: 2022-06-30
  Administered 2022-03-31: 01:00:00 4 mg via ORAL

## 2022-03-11 MED ORDER — BANANA FLAKES-TRANSGALACTOOLIGOSACCHARIDE ORAL POWDER PACKET
Freq: Two times a day (BID) | GASTROSTOMY | Status: DC
Start: 2022-03-11 — End: 2022-03-18
  Administered 2022-03-12 – 2022-03-17 (×12): via GASTROSTOMY

## 2022-03-11 NOTE — Progress Notes
[facility]	 [facility]	Medicine Progress NoteAttending Provider: Donald Siva Berkley Harvey, MDPCP: Pcp, Does Not Have A NoneHospital Day: 28 Patient examined by me: 3/21/2023Subjective: CC: Fever, unspecified fever cause Interim History: Patient seen and examined at bedside. Patient feeling slightly better. Reported BLE weakness. Pleasantly confused.Overnight Events: NoneReview of Systems Unable to perform ROS: Medical condition Review of Allergies/Meds/Hx: I have reviewed the patient's: allergies, current scheduled medications, current infusions, current prn medications, past medical history, past surgical history, family history, social history and prior to admission medications.Scheduled Meds:Current Facility-Administered Medications Medication Dose Route Frequency Provider Last Rate Last Admin ? [Held by provider] apixaban (ELIQUIS) tablet 5 mg  5 mg Per G Tube Q12H Littie Deeds, MD     ? Jerrel Ivory Plus oral powder packet  1 packet Per G Tube BID Kolbie Lepkowski, Berkley Harvey, MD     ? chlorhexidine gluconate (PERIDEX) 0.12 % solution 15 mL  15 mL Mouth/Throat BID Dorisann Frames, MD   15 mL at 03/11/22 0809 ? Elemental iron 60 mg (300 mg Ferrous sulfate) / 5 mL oral liquid 45 mg  45 mg Per G Tube BID Dorisann Frames, MD   45 mg at 03/11/22 0809 ? enoxaparin (LOVENOX) injection 80 mg  80 mg Subcutaneous Q12H Chaar, Abdelkader, MD   80 mg at 03/11/22 0809 ? famotidine (PEPCID) suspension (8 mg/mL) 20 mg  20 mg Per G Tube Daily Dorisann Frames, MD   20 mg at 03/11/22 0809 ? folic acid (FOLVITE) tablet 1 mg  1 mg Per G Tube Daily Dorisann Frames, MD   1 mg at 03/11/22 6045 ? niacin Immediate Release tablet 100 mg  100 mg Per G Tube Nightly Dorisann Frames, MD   100 mg at 03/10/22 2034 ? pyridoxine (vitamin B6) (B-6) tablet 100 mg  100 mg Per G Tube Daily Dorisann Frames, MD   100 mg at 03/11/22 4098 ? [Held by provider] rosuvastatin (CRESTOR) tablet 10 mg  10 mg Per G Tube Nightly Dorisann Frames, MD   10 mg at 02/14/22 2245 ? sodium chloride 0.9 % flush 3 mL  3 mL IV Push Q8H Dorisann Frames, MD   3 mL at 03/10/22 1531 ? thiamine (VITAMIN B1) tablet 100 mg  100 mg Per G Tube BID Dorisann Frames, MD   100 mg at 03/11/22 0809 Continuous Infusions:? sodium chloride 100 mL/hr (03/11/22 0145) PRN Meds:.acetaminophen, dextrose (GLUCOSE) oral liquid 15 g **OR** fruit juice **OR** skim milk, dextrose (GLUCOSE) oral liquid 30 g **OR** fruit juice, dextrose injection, dextrose injection, glucagon, sodium chlorideCurrent Facility-Administered Medications Medication Dose Route Frequency Last Rate ? acetaminophen  650 mg Per G Tube Q6H PRN   ? [Held by provider] apixaban  5 mg Per G Tube Q12H   ? banana flakes-t-galactooligos.  1 packet Per G Tube BID   ? chlorhexidine gluconate  15 mL Mouth/Throat BID   ? dextrose (GLUCOSE) oral liquid 15 g  15 g Oral Q15 MIN PRN    Or ? fruit juice  120 mL Oral Q15 MIN PRN    Or ? skim milk  240 mL Oral Q15 MIN PRN   ? dextrose (GLUCOSE) oral liquid 30 g  30 g Oral Q15 MIN PRN    Or ? fruit juice  240 mL Oral Q15 MIN PRN   ? dextrose injection  12.5 g Intravenous Q15 MIN PRN   ? dextrose injection  25 g Intravenous Q15 MIN PRN   ? Elemental iron  45 mg Per G Tube BID   ? enoxaparin (LOVENOX) treatment  80 mg Subcutaneous Q12H   ? famotidine  20 mg Per G Tube Daily   ? folic acid  1 mg Per G Tube Daily   ? glucagon  1 mg Intramuscular Once PRN   ? niacin  100 mg Per G Tube Nightly   ? pyridoxine (vitamin B6)  100 mg Per G Tube Daily   ? [Held by provider] rosuvastatin  10 mg Per G Tube Nightly   ? sodium chloride  3 mL IV Push Q8H   ? sodium chloride  3 mL IV Push PRN for Line Care   ? sodium chloride  100 mL/hr Intravenous Continuous 100 mL/hr (03/11/22 0145) ? thiamine  100 mg Per G Tube BID   Objective: Vitals: Patient Vitals for the past 24 hrs: Temp Temp src Pulse BP NIBP MAP (mmHg) Resp SpO2 03/11/22 0815 97.5 ?F (36.4 ?C) Axillary 80 130/77 95 16 98 % 03/10/22 2349 98.3 ?F (36.8 ?C) Oral (!) 109 126/80 96 17 100 % 03/10/22 1602 98.8 ?F (37.1 ?C) Oral (!) 102 136/72 94 17 100 % Physical Exam:  Constitutional: oriented to person, and no acute distress. Moderately confused.HEENT: Head: NCAT. Eyes: EOMI. PERRL. Neck: Neck supple. Cardiovascular: RRRS1S2.  No murmursPulmonary/Chest: BS +. no wheezes. no rales.Abdominal: Soft. BS +. Non-tender, Non-distended. Ileostomy-no blood. PEG site c/d/i.GU: PurewickMusculoskeletal: Normal ROM. Neurological: alert and oriented to person. Non focal--BLE weakness & slight drop foot.Extremities: BLE + edema.Skin: No rashesLabs:Recent Labs   03/19/230451 03/20/230537 03/21/230500 NA 140 140 140 K 3.4 3.1* 3.2* CL 111* 110* 108* CO2 22 22 23  BUN 10 8 10  CREATININE 0.21* 0.21* 0.23* Recent Labs   03/19/230451 03/20/230537 03/21/230500 WBC 5.5 5.4 5.8 HGB 8.6* 8.8* 10.0* HCT 28.70* 29.50* 33.60* PLT 262 293 297 Recent Labs   03/20/230537 ALT 16 AST 27 BILITOT <0.2 Micro: Covid negativeProcedures:None IV Access: PIVDiagnostics:No new radiology.No results found for this or any previous visit.ECG/Tele Events: I have reviewed the patient's ECG as resulted in the EMR.Assessment: Assessment: 66 y.o. 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 Admitted 2/24 for LTAC placement and persistent encephalopathy. Found to be febrile without clear infectious source found, fevers resolved. Extensive work-up of encephalopathy and leg weakness unremarkable. Course c/b elevated LFTs which resolved, likely 2/2 DILI from ceftriaxone or Zosyn. Stable and awaiting LTC placement, but with ongoing significant hypercalcemia. Mental status appears overall stable/improving.Plan: Hypercalcemia:Acute on chronic, but notable (12.4 corrected on 3/14)- PTH normal (28); work-up including PTH-rP (9), vitamin D testing, TSH, random cortisol unrevealing- Possibly d/t immobility or primary hyperparathyroidism; sent additional PTH testing with mineral metabolism level (send-out test)-iCa++-5.58- c/w IVF 100cc/hr - Continue FWF via G-tube- Daily calcium/albumin- Endocrine consult  recs: possible additional therapy pending PTH results. -Endo recommend Zolendronic acid 4 mg X1, obtain fasting CTX and repeat PTH with tomorrow's morning labs Acute encephalopathy:LE weakness:-Unclear etiology, possibly prolonged delirium iso hospitalization from Nov 2022 - currentMRI brain, MRI total spine unremarkable-LP unremarkable-Paraneoplastic and autoimmune encephalopathy panels negative-Overall somewhat improved cognitive status during admission, remains alert and oriented x 1-2-Neurology rec'd EMG to complete work-up of LE weakness but pt declined; Neurology does not recommend any further testing and signed off 02/26/22?Elevated LFTs:-resolved-ABDCT- ?Multiple peritoneal nodules-LFTs normal on admission but new elevation after 3d of admission-Work-up showed elevated IgG, smooth muscle antibodies c/f possible autoimmune hepatitisInitial  concern that imaging showed peritoneal nodules ?carcinomatosis-On review of imaging, nodules improving, more likely to be continued resolution of debris from perforation-Liver tests resolved without intervention, so most likely DILI from antibiotics (ceftriaxone and Zosyn)- Check LFTs weekly to ensure she is not evolving autoimmune hepatitis given multiple positive autoimmune markers?R lateral foot and L medial eminence deep tissue injuries:-Stable-Xray feet with degenerative changes but no acute findings- Podiatry following - no dressing requirements, WBAT to BLE.No?indication for podiatric surgical intervention at this time.  Patient may follow-up with Dr. Gazes within 3-4 weeks of discharge. - No need for abx at this time Hx Bilateral DVTs:- Apixaban 5mg  BID-HELD-c/w Lovenox 80 mg q12H?Elevated LFC ratio:Iron deficiency anemia:-Hematology consult &  signed off - elevated FLC ratio felt to be reactive, plasma cell disorder unlikely ?Transient Fevers and leukocytosis:- resolved-No clear source identified, possibly 2/2 DVT vs autoimmune?-S/p CTX 2/21 and Zosyn 2/21-2/25?Chronic conditions:-Rheumatoid Arthritis - has been off of MTX since November/acute illness, rheumatology signing off current ams/weakness not felt to be related to her RA, follow up outpatient -Bilateral LE DVT - ?provoked from recent surgery, cont lovenox, can resume apixaban at discharge?Hospital Issues:- F: none- E: replete PRN- N: Tube feeds- GI: bowel regimen- PPx: Lovenox 80 mg q12H (s/p Eliquis)- Code Status:Full ACLS?Disposition: LTC, family looking into T19 application FEN: diet, K>4, Mg>2 Lines: PIV Tubes: NoneMobility: pendingPT/OT: pending Disposition: pendingMedication Reconciliation: In ProgressCommunication: pendingSigned:Herby Amick Vivi Martens, MD, MSc  Communication via [facility] Hospitalist Service3/21/2023, 3:45 PMHigh MDM complexity : time spent obtaining history from patient or surrogate, review of medical records/prior chart documentation , examining/evaluating patient, ordering or performing treatments/interventions; ordering/reviewing laboratory studies, radiographic studies, pulse oximetry; re-evaluation of patient's condition and development of treatment plan, discussions with consultants, evaluation of patient's response to treatment. With 50% of time spent in care follow-up/care coordination, plan of care discussion with consulting specialities, and with the patient and/or patient's family/HCPOA/Surrogate Code Status Full Code DVT Prophylaxis Lovenox ADMIT MED REC In Progress PCP Pcp, Does Not Have A None Emergency contact Extended Emergency Contact InformationPrimary Emergency Contact: McCulley,ReneeHome Phone: [phone] Disposition TBD

## 2022-03-11 NOTE — Plan of Care
Plan of Care Overview/ Patient Status    1900-0700A&O1, disoriented to time, place and situation. VSS on RA. Pt can be uncooperative with care and very confused. Pt sometimes cries when care is being done. Pt doesn't endorse pain when asked about crying during care. Pt had no episodes of SOB this shift. Pt is on bolus TF of PIVOT 1.5, tolerating well in PEG tube with little to no residual. Dressings changed per MAR. Pt ileostomy drained of liquid stool. Pt on purewick and the purewick drained of urine this shift. Pt is a total, T&R q2hrs, podus boots in use for offloading and specialty bed in place. Pt on continuous NS drip running at 129mL/hr. Hourly monitoring completed, safety maintained, will continue to follow POC.

## 2022-03-11 NOTE — Plan of Care
Inpatient Physical Therapy Progress Note IP Adult PT Eval/Treat - 03/11/22 1102    Date of Visit / Treatment  Date of Visit / Treatment 03/11/22   Progress Report Due 03/31/22   End Time 1102    General Information  Subjective I'm too cool today.   General Observations Supine in bed, PEG tube, HOB elevated, B podus boots. Cleared by RN for PT session.   Precautions/Limitations Fall Precautions;Bed alarm   Precautions/Limitations Comment WBAT BLE    Weight Bearing Status  Weight Bearing Status Comments WBAT BLE    Vital Signs and Orthostatic Vital Signs  Vital Signs Vital Signs Stable   Vital Signs Free text RA, NAD    Pain/Comfort  Pain Comment (Pre/Post Treatment Pain) C/o mild LLE pain with movement, unable to quantify. RN in room and aware.    Patient Coping  Observed Emotional State accepting;cooperative   Diversional Activities television    Cognition  Overall Cognitive Status Impaired   Orientation Level Oriented to person   Level of Consciousness alert   Following Commands Follows one step commands with increased time;Follows one step commands with repetition   Personal Safety / Judgment Fall risk    Musculoskeletal  LUE Muscle Strength Grading 2-->active movement with gravity eliminated   RUE Muscle Strength Grading 2-->active movement with gravity eliminated   LLE Muscle Strength Grading 2-->active movement with gravity eliminated   RLE Muscle Strength Grading 2-->active movement with gravity eliminated    Skin Assessment  Skin Assessment See Nursing Documentation    Balance  Sitting Balance: Static  POOR-     Maximal assist to maintain static position with no Assistive Device   Sitting Balance: Dynamic  POOR-    Unable to move from midline voluntarily, maximal assist to right self   Balance Skills Training Comment Ax2    Bed Mobility  Supine-to-Sit Independence/Assistance Level Maximum assist;Assist of 2   Supine-to-Sit Assist Device Head of bed elevated;Hand held assist   Sit-to-Supine Independence/Assistance Level Maximum assist;Assist of 2   Sit-to-Supine Assist Device Hand held assist   Limitations decreased ability to use arms for pushing/pulling;decreased ability to use legs for bridging/pushing;impaired ability to control trunk for mobility   Bed Mobility, Impairments strength decreased;impaired balance;postural control impaired;pain   Bed Mobility Comments Pt requesting to return to supine upon sitting. Denied feeling light headed/dizzy. Reported feeling uncomfortable sitting up.    Sit-Stand Transfer Training  Sit-to-Stand Transfer Independence/Assistance Level Unable to perform    Handoff Documentation  Handoff Patient in bed;Bed alarm;Patient instructed to call nursing for mobility;Discussed with nursing    Endurance  Endurance Comments Poor    PT- AM-PAC - Basic Mobility Screen- How much help from another person do you currently need.....  Turning from your back to your side while in a a flat bed without using rails? 1 - Total - Requires total assistance or cannot do it at all.   Moving from lying on your back to sitting on the side of a flat bed without using bed rails? 1 - Total - Requires total assistance or cannot do it at all.   Moving to and from a bed to a chair (including a wheelchair)? 1 - Total - Requires total assistance or cannot do it at all.   Standing up from a chair using your arms(e.g., wheelchair or bedside chair)? 1 - Total - Requires total assistance or cannot do it at all.   To walk in a hospital room? 1 - Total - Requires total assistance  or cannot do it at all.   Climbing 3-5 steps with a railing? 1 - Total - Requires total assistance or cannot do it at all.   AMPAC Mobility Score 6   TARGET Highest Level of Mobility Mobility Level 2, Turn self in bed/bed activity/dependent transfer    ACTUAL Highest Level of Mobility Mobility Level 3, Sit on edge of bed    Therapeutic Exercise  Therapeutic Exercise Comments BUE/BLE AAROM and PROM as tolerated    Therapeutic Functional Activity  Therapeutic Functional Activity Comments Bed mobility, repositioning    Clinical Impression  Follow up Assessment Pt seen for PT follow up session. Much more alert and talkative than previous sessions. Continues to make mild improvements with therapy, despite improvements pt continues to be well below functional baseline. Limited by decreased strength, balance and activity tolerance. Will benefit from continued skilled PT to improve functional mobility.    Patient/Family Stated Goals  Patient/Family Stated Goal(s) feel better    Frequency/Equipment Recommendations  PT Frequency 3x per week   What day of week is next treatment expected? Thursday   3/22  PT/PTA completing this assessment Patsey Berthold    PT Recommendations for Inpatient Admission  Activity/Level of Assist mechanical lift;assist of 2    Planned Treatment / Interventions  Plan for Next Visit Progress as tolerated   Education Treatment / Interventions Patient Education / Training    PT Discharge Summary  Physical Therapy Disposition Recommendation Short Term Rehab     Lemmie Evens, [place]: [phone]

## 2022-03-11 NOTE — Plan of Care
Plan of Care Overview/ Patient Status    NPN 1200-1530p- A  and O X1-2, cooperative. MAE X4, severely deconditioned, denies pain. Normotensive, afebrile. LS clear diminished on r/a, tol Bolus tubefeeds and h20 flushes. OStomy draining watery stool, Banatrol to be added. No void in last 4 hr. Resting comfortably, call button within reach, bed alarm on.

## 2022-03-11 NOTE — Other
Called patient's sister [name]/HCP---no answer--mailbox full-unable to leave VM.

## 2022-03-11 NOTE — Plan of Care
Plan of Care Overview/ Patient Status    1900-0700A&Ox1, disoriented to time, situation and place. Pt is very confused. Pt VSS on RA. Pt dressings changed per MAR. Pt given medications crush through PEG tube with zero issues. Pt PED tube dressing changed and it is C/D/I. Pt bolus TF of PIVOT 1.5 given per MAR. Pt has a L/PIV that is C/D/I. Pt has NS running at 174m/hr. Pt is incontinent of urine, incontinence care provided this shift. Pt ileostomy and enterostomy are in place and intact. Pt ileostomy draining liquid brown stool. Pt stool output this shift was 400. Pt has a specialty bed in place, Pt T&R q2hrs. Hourly rounding completed, safety maintained, will continue to follow POC.

## 2022-03-12 LAB — BASIC METABOLIC PANEL
BKR ANION GAP: 8 (ref 7–17)
BKR BLOOD UREA NITROGEN: 7 mg/dL — ABNORMAL LOW (ref 8–23)
BKR BUN / CREAT RATIO: 31.8 — ABNORMAL HIGH (ref 8.0–23.0)
BKR CALCIUM: 10.2 mg/dL (ref 8.8–10.2)
BKR CHLORIDE: 107 mmol/L (ref 98–107)
BKR CO2: 24 mmol/L (ref 20–30)
BKR CREATININE: 0.22 mg/dL — ABNORMAL LOW (ref 0.40–1.30)
BKR EGFR, CREATININE (CKD-EPI 2021): >60 mL/min/{1.73_m2} (ref >=60)
BKR GLUCOSE: 95 mg/dL (ref 70–100)
BKR POTASSIUM: 3.1 mmol/L — ABNORMAL LOW (ref 3.3–5.3)
BKR SODIUM: 139 mmol/L (ref 136–144)

## 2022-03-12 LAB — ALBUMIN: BKR ALBUMIN: 2.9 g/dL — ABNORMAL LOW (ref 3.6–4.9)

## 2022-03-12 LAB — ZZZPARATHYROID HORMONE, MINERAL METABOLISM     (YH): PARATHYROID HORMONE, MINERAL METABOLISM: 57 nLEq/mL — ABNORMAL HIGH (ref 10–25)

## 2022-03-12 LAB — CORTISOL: BKR CORTISOL, PLASMA: 8.9 ug/dL

## 2022-03-12 LAB — PTH, INTACT WITHOUT CALCIUM: BKR PARATHYROID HORMONE INTACT: 33.6 pg/mL (ref 15.0–65.0)

## 2022-03-12 LAB — MAGNESIUM: BKR MAGNESIUM: 1.7 mg/dL (ref 1.7–2.4)

## 2022-03-12 MED ORDER — POTASSIUM BICARBONATE-CITRIC ACID 25 MEQ EFFERVESCENT TABLET
25 mEq | Freq: Every day | ORAL | Status: CP
Start: 2022-03-12 — End: ?
  Administered 2022-03-13 – 2022-03-17 (×5): 25 mEq via ORAL

## 2022-03-12 MED ORDER — POTASSIUM BICARBONATE-CITRIC ACID 25 MEQ EFFERVESCENT TABLET
25 mEq | Freq: Once | ORAL | Status: CP
Start: 2022-03-12 — End: ?
  Administered 2022-03-12: 13:00:00 25 mEq via ORAL

## 2022-03-12 NOTE — Progress Notes
[facility]	 [facility]	Endocrine Progress NoteAttending Provider: Thurnell Lose, MD Subjective: Interim History: - calcium today is 11.3Inpatient Medications:I have reviewed the patient's current medication orders.Objective: Vitals:Vitals:  03/10/22 0827 03/10/22 1602 03/10/22 2349 03/11/22 0815 BP: (!) 145/82 136/72 126/80 130/77 Pulse: 90 (!) 102 (!) 109 80 Resp: 17 17 17 16  Temp: 98.2 ?F (36.8 ?C) 98.8 ?F (37.1 ?C) 98.3 ?F (36.8 ?C) 97.5 ?F (36.4 ?C) TempSrc: Oral Oral Oral Axillary SpO2: 97% 100% 100% 98% Weight:     Height:     Endocrine Physical Exam:GENERAL: NADHEENT: Head is normocephalic and atraumatic. LUNGS: Chest rise even and unlaboredHEART: No appreciable JVDABDOMEN: NondistendedEXTREMITIES: Without any cyanosisNEUROLOGIC: CN II - XII grossly intactPSYCHIATRIC: confusedLabs/Imaging:I have reviewed the patient's labs/imaging within the last 24 hrs. Significant findings are below.Lab Results Component Value Date  CREATININE 0.23 (L) 03/11/2022  CALCIUM 10.1 03/11/2022  PHOS 2.5 03/09/2022  ALBUMIN 2.5 (L) 03/10/2022  PTH 36.0 03/05/2022  VITAMIND25HY 48 02/21/2022  VITD125DH 43 02/21/2022 Impression: Danielle [name] is a 66 year old woman with a PMH of RA on methotrexate. 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 She had continued encephalopathy ultimately requiring PEG placement. After discharge on 02/05/22, her sister brought her to [place]. She was admitted to [facility] on 02/11/22. Endocrine was consulted due to ongoing hypercalcemia. Although her PTH is not completely suppressed, it is not the driver of her hypercalcemia. The DDx for non-PTH mediated hypercalcemia includes granulomatous disease, exogenous intake of calcium/vitamin D, liver disease, malignancy, hyperthyroidism, immobilization, etc. Given that her diet has been controlled inpatient for months, excessive supplementation is less likely, confirmed by her normal vitamin D level. Her normal vitamin D 1,25 + chest imaging without evidence of granulomatous disease makes granulomatous disease unlikely. Her normal TSH and LFTs make hyperthyroidism and liver disease unlikely causes. 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 Hypercalcemia driven by immobility is possible, but her age makes this less likely to be the only cause. A mid-molecule PTH level has been drawn and is pending to determine if primary hyperparathyroidism is the cause of her elevated calcium. We will recommend zoledronic acid for long-term control.Recommendations: - please give zoledronic acid 4 mg now- please obtain fasting CTX and repeat PTH with tomorrow's morning labs- continue normal saline if patient can tolerate it- daily calcium + albumin for now- follow up mid-molecule PTH lab (drawn and pending)- avoid dehydration, thiazides, lithium, high calcium diet (> 1000 mg daily), and prolonged inactivityPlease refer to attending's addendum for final recommendations. Danielle [name], MDEndocrine FellowMHB: [phone]

## 2022-03-12 NOTE — Progress Notes
Pt alert, UTA orientation,On room air sating over goal.Restrictive on movement, turned q2 w/ assist x2.Feedings and flushes per MAR.Hourly rounding, meds given per MAR, wctm.

## 2022-03-12 NOTE — Progress Notes
[facility]	 [facility]	Medicine Progress NoteAttending Provider: Donald Siva Berkley Harvey, MDPCP: Pcp, Does Not Have A NoneHospital Day: 96 Patient examined by me: 3/22/2023Subjective: CC: Fever, unspecified fever cause Interim History: Patient seen and examined at bedside. Patient feeling tearful today, can't describe why. Reported that her sister never answers her telephone. Pleasantly confused.Overnight Events: NoneReview of Systems Unable to perform ROS: Medical condition Review of Allergies/Meds/Hx: I have reviewed the patient's: allergies, current scheduled medications, current infusions, current prn medications, past medical history, past surgical history, family history, social history and prior to admission medications.Scheduled Meds:Current Facility-Administered Medications Medication Dose Route Frequency Provider Last Rate Last Admin ? [Held by provider] apixaban (ELIQUIS) tablet 5 mg  5 mg Per G Tube Q12H Littie Deeds, MD     ? Jerrel Ivory Plus oral powder packet  1 packet Per G Tube BID Maresa Morash, Berkley Harvey, MD   1 packet at 03/11/22 2219 ? chlorhexidine gluconate (PERIDEX) 0.12 % solution 15 mL  15 mL Mouth/Throat BID Dorisann Frames, MD   15 mL at 03/12/22 6387 ? Elemental iron 60 mg (300 mg Ferrous sulfate) / 5 mL oral liquid 45 mg  45 mg Per G Tube BID Dorisann Frames, MD   45 mg at 03/12/22 5643 ? enoxaparin (LOVENOX) injection 80 mg  80 mg Subcutaneous Q12H Chaar, Abdelkader, MD   80 mg at 03/12/22 0910 ? famotidine (PEPCID) suspension (8 mg/mL) 20 mg  20 mg Per G Tube Daily Dorisann Frames, MD   20 mg at 03/12/22 0910 ? folic acid (FOLVITE) tablet 1 mg  1 mg Per G Tube Daily Dorisann Frames, MD   1 mg at 03/12/22 3295 ? niacin Immediate Release tablet 100 mg  100 mg Per G Tube Nightly Dorisann Frames, MD   100 mg at 03/11/22 2015 ? [START ON 03/13/2022] potassium bicarbonate-citric acid (EFFER-K, K-LYTE) effervescent tablet for oral solution 25 mEq  25 mEq Oral Daily Collie Kittel, Berkley Harvey, MD     ? pyridoxine (vitamin B6) (B-6) tablet 100 mg  100 mg Per G Tube Daily Dorisann Frames, MD   100 mg at 03/12/22 1884 ? [Held by provider] rosuvastatin (CRESTOR) tablet 10 mg  10 mg Per G Tube Nightly Dorisann Frames, MD   10 mg at 02/14/22 2245 ? sodium chloride 0.9 % flush 3 mL  3 mL IV Push Q8H Dorisann Frames, MD   3 mL at 03/10/22 1531 ? thiamine (VITAMIN B1) tablet 100 mg  100 mg Per G Tube BID Dorisann Frames, MD   100 mg at 03/12/22 0910 Continuous Infusions:? sodium chloride 100 mL/hr (03/12/22 0418) PRN Meds:.acetaminophen, dextrose (GLUCOSE) oral liquid 15 g **OR** fruit juice **OR** skim milk, dextrose (GLUCOSE) oral liquid 30 g **OR** fruit juice, dextrose injection, dextrose injection, glucagon, ondansetron **OR** ondansetron (ZOFRAN) IV Push, sodium chlorideCurrent Facility-Administered Medications Medication Dose Route Frequency Last Rate ? acetaminophen  650 mg Per G Tube Q6H PRN   ? [Held by provider] apixaban  5 mg Per G Tube Q12H   ? banana flakes-t-galactooligos.  1 packet Per G Tube BID   ? chlorhexidine gluconate  15 mL Mouth/Throat BID   ? dextrose (GLUCOSE) oral liquid 15 g  15 g Oral Q15 MIN PRN    Or ? fruit juice  120 mL Oral Q15 MIN PRN    Or ? skim milk  240 mL Oral Q15 MIN PRN   ?  dextrose (GLUCOSE) oral liquid 30 g  30 g Oral Q15 MIN PRN    Or ? fruit juice  240 mL Oral Q15 MIN PRN   ? dextrose injection  12.5 g Intravenous Q15 MIN PRN   ? dextrose injection  25 g Intravenous Q15 MIN PRN   ? Elemental iron  45 mg Per G Tube BID   ? enoxaparin (LOVENOX) treatment  80 mg Subcutaneous Q12H   ? famotidine  20 mg Per G Tube Daily   ? folic acid  1 mg Per G Tube Daily   ? glucagon  1 mg Intramuscular Once PRN   ? niacin  100 mg Per G Tube Nightly   ? ondansetron  4 mg Oral Q6H PRN    Or ? ondansetron (ZOFRAN) IV Push  4 mg IV Push Q6H PRN   ? [START ON 03/13/2022] potassium bicarbonate-citric acid  25 mEq Oral Daily   ? pyridoxine (vitamin B6)  100 mg Per G Tube Daily   ? [Held by provider] rosuvastatin  10 mg Per G Tube Nightly   ? sodium chloride  3 mL IV Push Q8H   ? sodium chloride  3 mL IV Push PRN for Line Care   ? sodium chloride  100 mL/hr Intravenous Continuous 100 mL/hr (03/12/22 0418) ? thiamine  100 mg Per G Tube BID   Objective: Vitals: Patient Vitals for the past 24 hrs: Temp Temp src Pulse BP NIBP MAP (mmHg) Resp SpO2 03/12/22 0824 98.2 ?F (36.8 ?C) Axillary (!) 96 115/76 89 18 99 % 03/11/22 2349 99.1 ?F (37.3 ?C) Axillary (!) 106 121/74 89 20 98 % 03/11/22 1650 97.6 ?F (36.4 ?C) Axillary (!) 97 137/89 105 18 99 % Physical Exam:  Constitutional: oriented to person, and no acute distress. Moderately confused.HEENT: Head: NCAT. Eyes: EOMI. PERRL. Neck: Neck supple. Cardiovascular: RRRS1S2.  No murmursPulmonary/Chest: BS +. no wheezes. no rales.Abdominal: Soft. BS +. Non-tender, Non-distended. Ileostomy-no blood. PEG site c/d/i.GU: PurewickMusculoskeletal: Normal ROM. Neurological: alert and oriented to person. Non focal--BLE weakness & slight drop foot.Extremities: BLE + edema.Skin: No rashesLabs:Recent Labs   03/20/230537 03/21/230500 03/22/230547 NA 140 140 139 K 3.1* 3.2* 3.1* CL 110* 108* 107 CO2 22 23 24  BUN 8 10 7* CREATININE 0.21* 0.23* 0.22* Recent Labs   03/20/230537 03/21/230500 WBC 5.4 5.8 HGB 8.8* 10.0* HCT 29.50* 33.60* PLT 293 297 Recent Labs   03/20/230537 ALT 16 AST 27 BILITOT <0.2 Micro: Covid negativeProcedures:None IV Access: PIVDiagnostics:No new radiology.No results found for this or any previous visit.ECG/Tele Events: I have reviewed the patient's ECG as resulted in the EMR.Assessment: Assessment: 66 y.o. 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 Admitted 2/24 for LTAC placement and persistent encephalopathy. Found to be febrile without clear infectious source found, fevers resolved. Extensive work-up of encephalopathy and leg weakness unremarkable. Course c/b elevated LFTs which resolved, likely 2/2 DILI from ceftriaxone or Zosyn. Stable and awaiting LTC placement, but with ongoing significant hypercalcemia. Mental status appears overall stable/improving.Plan: Hypercalcemia:Acute on chronic, but notable (12.4 corrected on 3/14)- PTH normal (28); work-up including PTH-rP (9), vitamin D testing, TSH, random cortisol unrevealing- Possibly d/t immobility or primary hyperparathyroidism; sent additional PTH testing with mineral metabolism level (send-out test)-iCa++-5.58- c/w IVF 100cc/hr - Continue FWF via G-tube- Daily calcium (10.2)/albumin (2.9)--> corrected 11.08- Endocrine consult  recs: possible additional therapy pending PTH results. -Endo recommend --s/p Zolendronic acid 4 mg X1, -pending  fasting CTx (Collagen Type-1 Telopeptide) (  send out)-repeat PTH 33.6, am Cortisol 8.9 Acute encephalopathy:LE weakness:-Unclear etiology, possibly prolonged delirium iso hospitalization from Nov 2022 - currentMRI brain, MRI total spine unremarkable-LP unremarkable-Paraneoplastic and autoimmune encephalopathy panels negative-Overall somewhat improved cognitive status during admission, remains alert and oriented x 1-2-Neurology rec'd EMG to complete work-up of LE weakness but pt declined; Neurology does not recommend any further testing and signed off 02/26/22?Elevated LFTs:-resolved-ABDCT- ?Multiple peritoneal nodules-LFTs normal on admission but new elevation after 3d of admission-Work-up showed elevated IgG, smooth muscle antibodies c/f possible autoimmune hepatitisInitial concern that imaging showed peritoneal nodules ?carcinomatosis-On review of imaging, nodules improving, more likely to be continued resolution of debris from perforation-Liver tests resolved without intervention, so most likely DILI from antibiotics (ceftriaxone and Zosyn)- Check LFTs weekly to ensure she is not evolving autoimmune hepatitis given multiple positive autoimmune markersRecurrent Hypokalemia:-appears c/w GI losses with watery ileostomy stool--Banatrol added-replaced via G-tube--will add daily dosing-trend UOP.BMP/Cr ?R lateral foot and L medial eminence deep tissue injuries:-Stable-Xray feet with degenerative changes but no acute findings- Podiatry following - no dressing requirements, WBAT to BLE.No?indication for podiatric surgical intervention at this time.  Patient may follow-up with Dr. Gazes within 3-4 weeks of discharge. - No need for abx at this time Hx Bilateral DVTs:- Apixaban 5mg  BID-HELD-c/w Lovenox 80 mg q12H?Elevated LFC ratio:Iron deficiency anemia:-Hematology consult &  signed off - elevated FLC ratio felt to be reactive, plasma cell disorder unlikely ?Transient Fevers and leukocytosis:- resolved-No clear source identified, possibly 2/2 DVT vs autoimmune?-S/p CTX 2/21 and Zosyn 2/21-2/25?Chronic conditions:-Rheumatoid Arthritis - has been off of MTX since November/acute illness, rheumatology signing off current ams/weakness not felt to be related to her RA, follow up outpatient -Bilateral LE DVT - ?provoked from recent surgery, cont lovenox, can resume apixaban at discharge?Hospital Issues:- F: none- E: replete PRN- N: Tube feeds- GI: bowel regimen- PPx: Lovenox 80 mg q12H (s/p Eliquis)- Code Status:Full ACLS?Disposition: LTC, family looking into T19 application FEN: diet, K>4, Mg>2 Lines: PIV Tubes: NoneMobility: pendingPT/OT: pending Disposition: pendingMedication Reconciliation: In ProgressCommunication: pendingSigned:Corona Popovich Vivi Martens, MD, MSc  Communication via [facility] Hospitalist Service3/22/2023, 1:20 PMHigh MDM complexity : time spent obtaining history from patient or surrogate, review of medical records/prior chart documentation , examining/evaluating patient, ordering or performing treatments/interventions; ordering/reviewing laboratory studies, radiographic studies, pulse oximetry; re-evaluation of patient's condition and development of treatment plan, discussions with consultants, evaluation of patient's response to treatment. With 50% of time spent in care follow-up/care coordination, plan of care discussion with consulting specialities, and with the patient and/or patient's family/HCPOA/Surrogate Code Status Full Code DVT Prophylaxis Lovenox ADMIT MED REC In Progress PCP Pcp, Does Not Have A None Emergency contact Extended Emergency Contact InformationPrimary Emergency Contact: McCulley,ReneeHome Phone: [phone] Disposition TBD

## 2022-03-12 NOTE — Plan of Care
Plan of Care Overview/ Patient Status    15:00 - 19:00Vital signs stable. No complaints or issues on shift. Tube feed and FWF tolerated well via PEG tube. IV fluids and scheduled medications given as ordered. See flow sheet for full assessment.

## 2022-03-12 NOTE — Plan of Care
Plan of Care Overview/ Patient Status    1900-0700A&Ox1, disoriented to time, situation and place. VSS on RA. Pt can be uncooperative with care and is exteremly confused.  Pt doesn't endorse pain and Pt had no episodes of SOB this shift. Pt ileostomy and enterostomy bags both replaced after Pt ripped both bags off. Pt produced of stool this shift. Pt is on purewick that was changed today. Barrier cream applied to bottom. Dressings changed per MAR. Pt bolus TF was completed per MAR. Banatol tolerated well. Hourly rounding,complete, safety maintained, will continue to follow POC.

## 2022-03-13 LAB — BASIC METABOLIC PANEL
BKR ANION GAP: 7 (ref 7–17)
BKR ANION GAP: 8 (ref 7–17)
BKR BLOOD UREA NITROGEN: 14 mg/dL (ref 8–23)
BKR BLOOD UREA NITROGEN: 12 mg/dL (ref 8–23)
BKR BUN / CREAT RATIO: 53.8 — ABNORMAL HIGH (ref 8.0–23.0)
BKR BUN / CREAT RATIO: 44.4 — ABNORMAL HIGH (ref 8.0–23.0)
BKR CALCIUM: 9.5 mg/dL (ref 8.8–10.2)
BKR CALCIUM: 9.3 mg/dL (ref 8.8–10.2)
BKR CHLORIDE: 105 mmol/L (ref 98–107)
BKR CHLORIDE: 107 mmol/L (ref 98–107)
BKR CO2: 24 mmol/L (ref 20–30)
BKR CO2: 23 mmol/L (ref 20–30)
BKR CREATININE: 0.26 mg/dL — ABNORMAL LOW (ref 0.40–1.30)
BKR CREATININE: 0.27 mg/dL — ABNORMAL LOW (ref 0.40–1.30)
BKR EGFR, CREATININE (CKD-EPI 2021): >60 mL/min/{1.73_m2} (ref >=60)
BKR EGFR, CREATININE (CKD-EPI 2021): >60 mL/min/{1.73_m2} (ref >=60)
BKR GLUCOSE: 144 mg/dL — ABNORMAL HIGH (ref 70–100)
BKR GLUCOSE: 96 mg/dL (ref 70–100)
BKR POTASSIUM: 4.1 mmol/L (ref 3.3–5.3)
BKR POTASSIUM: 4 mmol/L (ref 3.3–5.3)
BKR SODIUM: 136 mmol/L (ref 136–144)
BKR SODIUM: 138 mmol/L (ref 136–144)

## 2022-03-13 LAB — ALBUMIN: BKR ALBUMIN: 2.8 g/dL — ABNORMAL LOW (ref 3.6–4.9)

## 2022-03-13 LAB — MAGNESIUM: BKR MAGNESIUM: 1.8 mg/dL (ref 1.7–2.4)

## 2022-03-13 NOTE — Plan of Care
Plan of Care Overview/ Patient Status    Temp:  [97.8 ?F (36.6 ?C)-100.9 ?F (38.3 ?C)] 97.8 ?F (36.6 ?C)Pulse:  [90-121] 90Resp:  [16-18] 18BP: (113-144)/(68-82) 113/73SpO2:  [94 %-100 %] 95 %Device (Oxygen Therapy): room air1900 -0700 Neuro: A&Ox UTA, pt non verbalResp: WDL on room airCardiac: TachycardiaGI/GU: Ileostomy x2, PEG tubeMS: Assist x 2 in bedSkin: See flowsheetsDiet: Tube Feed, no tray Pain: None indicatedScheduled medications were administered per MAR, tolerated well. Hourly rounds and q2 hr turn and repositioning performed. Safety maintained, call bell within reach bed alarm engaged. 03/13/2022 8:39 AM

## 2022-03-13 NOTE — Plan of Care
Plan of Care Overview/ Patient Status    0700-1900Alert, confused. VSS on room air, febrile prn tylenol given, Covering provider notified. Patient L/R ileostomy intact, R draining dark brown loose stools. Purewick in place draining yellow urine. Patient Bilateral lower extremities elevated. Jevity 1. 5 boluses given as per provider order. Patient safety maintained.

## 2022-03-13 NOTE — Plan of Care
Danielle [name]					Location: EP [id number]/[id number]-[id number] y.o., female				Attending: Thurnell Lose, MD	Admit Date: 02/11/2022			[id number] LOS: 30 days FOLLOW-UP NUTRITION ASSESSMENT Dietary Orders (From admission, onward)     Start     Ordered  03/13/22 1035  Diet Tube Feed No Tray  DIET EFFECTIVE NOW      Comments: JEVITY 1.5 @ 1.5 cartons bolus TID @9am ,1pm,5pm with 1 carton at 9pm to total 5.5 cartons daily) Question Answer Comment Adult Tube Feed Formulas: Jevity 1.5 (YNH,SRC)  Feeding Route: G-Tube  Continuous Rate ml/hr: Other(see comments)  Initiate Nutrition Management Protocol (Yes/No?) Yes - Initiate Protocol    03/13/22 1034    No Known AllergiesANTHROPOMETRICS:Height:???70Admit wt:?no recent wt to assess.?Current wt:?79.4 kg (02/14/22)?- manage order placed to check wt now and every Monday after. Dosing Wt:?78?kg (based on?hamwi +10%)BMI:?25??(based on?current wt)?Additional wt information:Ht confirmed with pt: NO?- pt not able to provide hx. Pt not in a bed with scale. Pt last known wt was 98.6 kg (12/13/2018).?Wt loss?confirmed with admit wt?as pt no longer weighs?98 kg.?Time line for wt loss in not clear and pt cannot report.???    ? ? Wt Readings from Last 20 Encounters:  12/13/18 98.6 kg  ??NUTRITION-FOCUSED PHYSICAL EXAM:-Muscle Depletion: Moderate????????????temples, clavicles, shoulders, interosseous, scapula, calf-Subcutaneous Fat Depletion: Moderate ????????????orbital region, triceps, ribs?ESTIMATED NUTRITION REQUIREMENTS:Kcal/day:?1950??????????????(25 ?kcal/kg)Protein/day:?94 ?????????????(1.2gm/kg)Fluids/day:?1950 mL/d?(60mL/kcal) ONLY as medical and fluid status allows and only per team discretion. Needs based on:?Dosing Wt (78?kg), ? Carcinomatosis, maintenance.?NUTRITION ASSESSMENT:?PT EMR reviewed for follow up assessment. Pt maintained on tube feeds over this past several weeks. No intolerances reported over this past week. ????Pt?not able to provide hx.?Labs and meds reviewed and standard formula remains appropriate to continue.?Labs notable for?stable sodium, potassium, phosphorus. Stable?BUN/Cr, and stable glucose on goal rate feeds. No treatment provided for glucose control. Will transition to Jevity 1.5 as it is back in stock and PIVOT is no on backorder. The two are comparable options and therefore should be no nutrition related alterations with this switch.  ???Cultural/Religious/Ethnic NeeTIBds:?Unknown?NUTRITION DIAGNOSIS:?Severe malnutrition r/t depletion aeb moderate muscle and fat depletion.- continues??INTERVENTIONS/RECOMMENDATIONS:?Enteral/ Parenteral Nutrition?	Formula-?JEVITY 1.5 @ 1 carton bolus TID (@9am ,1pm,5pm) with 1.5 cartons at 9pm to total 5.5 cartons daily)-	Provides:?1.3L,?1955kcal,?82gr Protein,?988?mL free water/d-	Additional Free water @?960?mL/d as medical/ fluid status allows at Teams discretion.?Pt getting?1600?ml free water through flushes with? every?6?hours.??	Goal: ?-	Patient will receive >90% of goal volume TF/ TPN prior to next nutrition assessment- met will continue???Discharge and Transfer of Nutrition Care to New Setting or Provider:?	?Nutrition Prescription- Discharge needs not determined at this time will continue to follow ?MONITORING / EVALUATION:Food/Nutrition-Related Outcomes:????????????food and nutrient intake/administration.Anthropometric Outcomes:????????????weightBiochemical Data, Medical Tests, and Procedure Outcomes:????????????Labs (General chemistry, renal labs, Glucose).Nutrition-Focused Physical Finding Outcomes:????????????Physical appearance, muscle and fat wasting, swallow function, appetite.??Electronically signed by Sherrilee Gilles, RD, March 23, 2023M[facility]- [phone] Please note: Nutrition is a consult only service on weekends and holidays. Please enter a consult in EPIC if assistance is needed on a weekend or holiday or via [facility] [phone] to contact the covering RD.

## 2022-03-13 NOTE — Progress Notes
[facility]	 [facility]	Medicine Progress NoteAttending Provider: Donald Siva Berkley Harvey, MDPCP: Pcp, Does Not Have A NoneHospital Day: 30 Patient examined by me: 3/23/2023Subjective: CC: Fever, unspecified fever cause Interim History: Patient seen and examined at bedside. Patient feeling better. Offers no complaints. Pleasantly confused.Overnight Events: NoneReview of Systems Unable to perform ROS: Mental acuity Review of Allergies/Meds/Hx: I have reviewed the patient's: allergies, current scheduled medications, current infusions, current prn medications, past medical history, past surgical history, family history, social history and prior to admission medications.Scheduled Meds:Current Facility-Administered Medications Medication Dose Route Frequency Provider Last Rate Last Admin ? [Held by provider] apixaban (ELIQUIS) tablet 5 mg  5 mg Per G Tube Q12H Littie Deeds, MD     ? Jerrel Ivory Plus oral powder packet  1 packet Per G Tube BID Donesha Wallander, Berkley Harvey, MD   1 packet at 03/13/22 [phone] ? chlorhexidine gluconate (PERIDEX) 0.12 % solution 15 mL  15 mL Mouth/Throat BID Dorisann Frames, MD   15 mL at 03/13/22 0854 ? Elemental iron 60 mg (300 mg Ferrous sulfate) / 5 mL oral liquid 45 mg  45 mg Per G Tube BID Dorisann Frames, MD   45 mg at 03/13/22 0854 ? enoxaparin (LOVENOX) injection 80 mg  80 mg Subcutaneous Q12H Chaar, Abdelkader, MD   80 mg at 03/13/22 0854 ? famotidine (PEPCID) suspension (8 mg/mL) 20 mg  20 mg Per G Tube Daily Dorisann Frames, MD   20 mg at 03/13/22 0854 ? folic acid (FOLVITE) tablet 1 mg  1 mg Per G Tube Daily Dorisann Frames, MD   1 mg at 03/13/22 9604 ? niacin Immediate Release tablet 100 mg  100 mg Per G Tube Nightly Dorisann Frames, MD   100 mg at 03/12/22 2219 ? potassium bicarbonate-citric acid (EFFER-K, K-LYTE) effervescent tablet for oral solution 25 mEq  25 mEq Oral Daily Laterra Lubinski, Berkley Harvey, MD   25 mEq at 03/13/22 0854 ? pyridoxine (vitamin B6) (B-6) tablet 100 mg  100 mg Per G Tube Daily Dorisann Frames, MD   100 mg at 03/13/22 0854 ? [Held by provider] rosuvastatin (CRESTOR) tablet 10 mg  10 mg Per G Tube Nightly Dorisann Frames, MD   10 mg at 02/14/22 2245 ? sodium chloride 0.9 % flush 3 mL  3 mL IV Push Q8H Dorisann Frames, MD   3 mL at 03/13/22 1335 ? thiamine (VITAMIN B1) tablet 100 mg  100 mg Per G Tube BID Dorisann Frames, MD   100 mg at 03/13/22 0854 Continuous Infusions:? sodium chloride 100 mL/hr (03/13/22 1334) PRN Meds:.acetaminophen, dextrose (GLUCOSE) oral liquid 15 g **OR** fruit juice **OR** skim milk, dextrose (GLUCOSE) oral liquid 30 g **OR** fruit juice, dextrose injection, dextrose injection, glucagon, ondansetron **OR** ondansetron (ZOFRAN) IV Push, sodium chlorideCurrent Facility-Administered Medications Medication Dose Route Frequency Last Rate ? acetaminophen  650 mg Per G Tube Q6H PRN   ? [Held by provider] apixaban  5 mg Per G Tube Q12H   ? banana flakes-t-galactooligos.  1 packet Per G Tube BID   ? chlorhexidine gluconate  15 mL Mouth/Throat BID   ? dextrose (GLUCOSE) oral liquid 15 g  15 g Oral Q15 MIN PRN    Or ? fruit juice  120 mL Oral Q15 MIN PRN    Or ? skim milk  240 mL Oral Q15 MIN PRN   ? dextrose (GLUCOSE) oral liquid 30 g  30 g Oral Q15 MIN PRN    Or ? fruit juice  240 mL Oral Q15 MIN PRN   ? dextrose injection  12.5 g Intravenous Q15 MIN PRN   ? dextrose injection  25 g Intravenous Q15 MIN PRN   ? Elemental iron  45 mg Per G Tube BID   ? enoxaparin (LOVENOX) treatment  80 mg Subcutaneous Q12H   ? famotidine  20 mg Per G Tube Daily   ? folic acid  1 mg Per G Tube Daily   ? glucagon  1 mg Intramuscular Once PRN   ? niacin  100 mg Per G Tube Nightly   ? ondansetron  4 mg Oral Q6H PRN    Or ? ondansetron (ZOFRAN) IV Push  4 mg IV Push Q6H PRN   ? potassium bicarbonate-citric acid  25 mEq Oral Daily   ? pyridoxine (vitamin B6)  100 mg Per G Tube Daily   ? [Held by provider] rosuvastatin  10 mg Per G Tube Nightly   ? sodium chloride  3 mL IV Push Q8H   ? sodium chloride  3 mL IV Push PRN for Line Care   ? sodium chloride  100 mL/hr Intravenous Continuous 100 mL/hr (03/13/22 1334) ? thiamine  100 mg Per G Tube BID   Objective: Vitals: Patient Vitals for the past 24 hrs: Temp Temp src Pulse BP NIBP MAP (mmHg) Resp SpO2 03/13/22 1200 98 ?F (36.7 ?C) Oral (!) 102 134/86 102 18 99 % 03/13/22 0829 97.8 ?F (36.6 ?C) Oral 90 113/73 87 18 95 % 03/13/22 0500 98.6 ?F (37 ?C) Axillary (!) 92 124/82 96 16 100 % 03/12/22 2340 (!) 100.9 ?F (38.3 ?C) Oral (!) 121 127/68 87 16 94 % 03/12/22 1541 98 ?F (36.7 ?C) Oral (!) 104 (!) 144/82 103 16 97 % Physical Exam:  Constitutional: oriented to person, and no acute distress. Moderately confused.HEENT: Head: NCAT. Eyes: EOMI. PERRL. Neck: Neck supple. Cardiovascular: RRRS1S2.  No murmursPulmonary/Chest: BS +. no wheezes. no rales.Abdominal: Soft. BS +. Non-tender, Non-distended. Ileostomy-no blood. PEG site c/d/i.GU: PurewickMusculoskeletal: Normal ROM. Neurological: alert and oriented to person. Non focal--BLE weakness & slight drop foot.Extremities: BLE + edema.Skin: No rashesLabs:Recent Labs   03/21/230500 03/22/230547 03/23/230106 03/23/230513 NA 140 139 136 138 K 3.2* 3.1* 4.1 4.0 CL 108* 107 105 107 CO2 23 24 24 23  BUN 10 7* 14 12 CREATININE 0.23* 0.22* 0.26* 0.27* Recent Labs   03/21/230500 WBC 5.8 HGB 10.0* HCT 33.60* PLT 297 Micro: Covid negativeProcedures:None IV Access: PIVDiagnostics:No new radiology.No results found for this or any previous visit.ECG/Tele Events: I have reviewed the patient's ECG as resulted in the EMR.Assessment: Assessment: 66 y.o. 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 Admitted 2/24 for LTAC placement and persistent encephalopathy. Found to be febrile without clear infectious source found, fevers resolved. Extensive work-up of encephalopathy and leg weakness unremarkable. Course c/b elevated LFTs which resolved, likely 2/2 DILI from ceftriaxone or Zosyn. Stable and awaiting LTC placement, but with ongoing significant hypercalcemia. Mental status appears overall stable/improving.Plan: Hypercalcemia:-Acute on chronic- PTH normal (28); work-up including PTH-rP (9), vitamin D testing, TSH, random cortisol unrevealing- Possibly d/t immobility or primary hyperparathyroidism; sent additional PTH testing with mineral metabolism level (send-out test)-iCa++-5.58 (03/11/22)- c/w IVF 100-- --will dc - Continue FWF via G-tube 400 ml q6H--repeat PTH 33.6, am Cortisol 8.9- Daily calcium (9.3)/albumin (2.8)--> corrected 10.26-wnl-trend Ca++ + Alb q3days while IP- Endocrine consult  recs:  possible additional therapy pending PTH results. -Endo recommend --s/p Zolendronic acid 4 mg X1 (3/21)-pending  fasting CTx (Collagen Type-1 Telopeptide) (send out) Acute encephalopathy:LE weakness:-Unclear etiology, possibly prolonged delirium iso hospitalization from Nov 2022 - currentMRI brain, MRI total spine unremarkable-LP unremarkable-Paraneoplastic and autoimmune encephalopathy panels negative-Overall somewhat improved cognitive status during admission, remains alert and oriented x 1-2-Neurology rec'd EMG to complete work-up of LE weakness but pt declined; Neurology does not recommend any further testing and signed off 02/26/22?Elevated LFTs:-resolved-ABDCT- ?Multiple peritoneal nodules-LFTs normal on admission but new elevation after 3d of admission-Work-up showed elevated IgG, smooth muscle antibodies c/f possible autoimmune hepatitisInitial concern that imaging showed peritoneal nodules ?carcinomatosis-On review of imaging, nodules improving, more likely to be continued resolution of debris from perforation-Liver tests resolved without intervention, so most likely DILI from antibiotics (ceftriaxone and Zosyn)- Check LFTs weekly to ensure she is not evolving autoimmune hepatitis given multiple positive autoimmune markersRecurrent Hypokalemia:-appears c/w GI losses with watery ileostomy stool--Banatrol added-replaced via G-tube--will add daily dosing-trend UOP.BMP/Cr ?R lateral foot and L medial eminence deep tissue injuries:-Stable-Xray feet with degenerative changes but no acute findings- Podiatry following - no dressing requirements, WBAT to BLE.No?indication for podiatric surgical intervention at this time.  Patient may follow-up with Dr. Gazes within 3-4 weeks of discharge. - No need for abx at this time Hx Bilateral DVTs:- Apixaban 5mg  BID-HELD-c/w Lovenox 80 mg q12H?Elevated LFC ratio:Iron deficiency anemia:-Hematology consult &  signed off - elevated FLC ratio felt to be reactive, plasma cell disorder unlikely ?Transient Fevers and leukocytosis:- resolved-No clear source identified, possibly 2/2 DVT vs autoimmune?-S/p CTX 2/21 and Zosyn 2/21-2/25?Chronic conditions:-Rheumatoid Arthritis - has been off of MTX since November/acute illness, rheumatology signing off current ams/weakness not felt to be related to her RA, follow up outpatient -Bilateral LE DVT - ?provoked from recent surgery, cont lovenox, can resume apixaban at discharge?Hospital Issues:- F: none- E: replete PRN- N: Tube feeds- GI: bowel regimen- PPx: Lovenox 80 mg q12H (s/p Eliquis)- Code Status:Full ACLS?Disposition: LTC, family looking into T19 application FEN: diet, K>4, Mg>2 Lines: PIV Tubes: NoneMobility: pendingPT/OT: pending Disposition: pendingMedication Reconciliation: In ProgressCommunication: pendingSigned:Elwyn [name] Vivi Martens, MD, MSc  Communication via [facility] Hospitalist Service3/23/2023, 3:10 PMHigh MDM complexity : time spent obtaining history from patient or surrogate, review of medical records/prior chart documentation , examining/evaluating patient, ordering or performing treatments/interventions; ordering/reviewing laboratory studies, radiographic studies, pulse oximetry; re-evaluation of patient's condition and development of treatment plan, discussions with consultants, evaluation of patient's response to treatment. With 50% of time spent in care follow-up/care coordination, plan of care discussion with consulting specialities, and with the patient and/or patient's family/HCPOA/Surrogate Code Status Full Code DVT Prophylaxis Lovenox ADMIT MED REC In Progress PCP Pcp, Does Not Have A None Emergency contact Extended Emergency Contact InformationPrimary Emergency Contact: McCulley,ReneeHome Phone: [phone] Disposition TBD

## 2022-03-13 NOTE — Progress Notes
[facility]	 [facility]	Endocrine Progress NoteAttending Provider: Thurnell Lose, MD Subjective: Interim History: - corrected calcium today is 10.3, stable from 10.5 yesterdayInpatient Medications:I have reviewed the patient's current medication orders.Objective: Vitals:Vitals:  03/12/22 0824 03/12/22 1541 03/12/22 2340 03/13/22 0500 BP: 115/76 (!) 144/82 127/68 124/82 Pulse: (!) 96 (!) 104 (!) 121 (!) 92 Resp: 18 16 16 16  Temp: 98.2 ?F (36.8 ?C) 98 ?F (36.7 ?C) (!) 100.9 ?F (38.3 ?C) 98.6 ?F (37 ?C) TempSrc: Axillary Oral Oral Axillary SpO2: 99% 97% 94% 100% Weight:     Height:     Endocrine Physical Exam:GENERAL: NADHEENT: Head is normocephalic and atraumatic. LUNGS: Chest rise even and unlaboredHEART: No appreciable JVDABDOMEN: NondistendedEXTREMITIES: Without any cyanosisNEUROLOGIC: CN II - XII grossly intactPSYCHIATRIC: confusedLabs/Imaging:I have reviewed the patient's labs/imaging within the last 24 hrs. Significant findings are below.Lab Results Component Value Date  CREATININE 0.27 (L) 03/13/2022  CALCIUM 9.3 03/13/2022  PHOS 2.5 03/09/2022  ALBUMIN 2.8 (L) 03/13/2022  PTH 33.6 03/12/2022  VITAMIND25HY 48 02/21/2022  VITD125DH 43 02/21/2022 Impression: Ms. Lockner is a 66 year old woman with a PMH of RA on methotrexate. 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 She had continued encephalopathy ultimately requiring PEG placement. After discharge on 02/05/22, her sister brought her to [place]. She was admitted to [facility] on 02/11/22. Endocrine was consulted due to ongoing hypercalcemia. Although her PTH is not completely suppressed, it is not the driver of her hypercalcemia. The DDx for non-PTH mediated hypercalcemia includes granulomatous disease, exogenous intake of calcium/vitamin D, liver disease, malignancy, hyperthyroidism, immobilization, etc. Given that her diet has been controlled inpatient for months, excessive supplementation is less likely, confirmed by her normal vitamin D level. Her normal vitamin D 1,25 + chest imaging without evidence of granulomatous disease makes granulomatous disease unlikely. Her normal TSH and LFTs make hyperthyroidism and liver disease unlikely causes. 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 Hypercalcemia driven by immobility is possible, but her age makes this less likely to be the only cause. Her mid-molecule PTH level was elevated, suggesting that primary hyperparathyroidism is the cause of her elevated calcium. We will recommend zoledronic acid for long-term control. She received zoledronic acid on 3/21. Given that PHPT could be to be the underlying cause, if her calcium again rises, cinacalcet could be considered.Recommendations: - follow up fasting CTX, drawn and pending- recommend calcium + albumin at least every three days while inpatient- avoid dehydration, thiazides, lithium, high calcium diet (> 1000 mg daily), and prolonged inactivityEndocrine will sign off now. Please reconsult if questions arise. Please refer to attending's addendum for final recommendations. [name], MDEndocrine FellowMHB: [phone]

## 2022-03-13 NOTE — Plan of Care
Inpatient Physical Therapy Progress Note IP Adult PT Eval/Treat - 03/13/22 0957    Date of Visit / Treatment  Date of Visit / Treatment 03/13/22   End Time 0957    General Information  Subjective Agreeable to PT   General Observations RN consulted. Pt supine in bed. PEG. Podus boots. PIV.Ostomy.   Precautions/Limitations Fall Precautions;Bed alarm   Precautions/Limitations Comment WBAT B LE    Weight Bearing Status  Weight Bearing Status Comments WBAT B LE    Vital Signs and Orthostatic Vital Signs  Vital Signs Vital Signs Stable   Vital Signs Free text RA    Pain/Comfort  Pain Comment (Pre/Post Treatment Pain) No indication of pain   Pain Rating at Rest 0/10 - no pain    Cognition  Overall Cognitive Status Impaired   Orientation Level Oriented to person   Level of Consciousness alert   Following Commands Follows one step commands with repetition;Follows one step commands with increased time   Administrator, arts / Judgment Fall risk   Affect Flat   Cognition Comments Nonverbal t/o PT session    Manual Muscle Testing  Manual Muscle Testing Comments + deconditioning    Musculoskeletal  LUE Muscle Strength Grading 2-->active movement with gravity eliminated   RUE Muscle Strength Grading 2-->active movement with gravity eliminated   LLE Muscle Strength Grading 2-->active movement with gravity eliminated   RLE Muscle Strength Grading 2-->active movement with gravity eliminated    Skin Assessment  Skin Assessment See Nursing Documentation    Balance  Sitting Balance: Static  POOR-     Maximal assist to maintain static position with no Assistive Device   Sitting Balance: Dynamic  POOR-    Unable to move from midline voluntarily, maximal assist to right self   Balance Assist Device Hand held assist   Balance Skills Training Comment A x 2    Bed Mobility  Rolling/Turning Right - Independence/Assistance Level Maximum assist   Rolling/Turning Left - Independence/Assistance Level Maximum assist   Rolling/Turning Assist Device Bed rails;Hand held assist   Supine-to-Sit Independence/Assistance Level Total assist/dependent   Supine-to-Sit Assist Device Draw pad;Hand held assist;Head of bed elevated   Sit-to-Supine Independence/Assistance Level Total assist/dependent   Sit-to-Supine Assist Device No device   Limitations decreased ability to use arms for pushing/pulling;decreased ability to use legs for bridging/pushing;impaired ability to control trunk for mobility   Bed Mobility, Impairments decreased flexibility;strength decreased;impaired balance;postural control impaired   Bed mobility was limited by: impaired motor function   Bed Mobility Comments Sitting EOB with Max A x 2.    Sit-Stand Transfer Training  Sit-to-Stand Transfer Independence/Assistance Level Unable to perform    Handoff Documentation  Handoff Patient in bed;Bed alarm;Patient instructed to call nursing for mobility;Discussed with nursing    Endurance  Endurance Comments Poor    PT- AM-PAC - Basic Mobility Screen- How much help from another person do you currently need.....  Turning from your back to your side while in a a flat bed without using rails? 1 - Total - Requires total assistance or cannot do it at all.   Moving from lying on your back to sitting on the side of a flat bed without using bed rails? 1 - Total - Requires total assistance or cannot do it at all.   Moving to and from a bed to a chair (including a wheelchair)? 1 - Total - Requires total assistance or cannot do it at all.   Standing up from a chair using your arms(e.g.,  wheelchair or bedside chair)? 1 - Total - Requires total assistance or cannot do it at all.   To walk in a hospital room? 1 - Total - Requires total assistance or cannot do it at all.   Climbing 3-5 steps with a railing? 1 - Total - Requires total assistance or cannot do it at all.   AMPAC Mobility Score 6   TARGET Highest Level of Mobility Mobility Level 2, Turn self in bed/bed activity/dependent transfer    ACTUAL Highest Level of Mobility Mobility Level 2, Turn self in bed/bed activity/ dependent transfer    Therapeutic Exercise  Therapeutic Exercise Comments Supine/seated (B) AAROM/PROM    Clinical Impression  Follow up Assessment Pt is Max A x 2/Dependent with bed mobility. Deconditioned. Decreased activity tolerance. Nonverbal t/o PT session. Imapired cognition and functional mobility. Pt will benefit from PT services to gain strength, balance and endurance to return to baseline.    Patient/Family Stated Goals  Patient/Family Stated Goal(s) unable to state    Frequency/Equipment Recommendations  PT Frequency 3x per week   What day of week is next treatment expected? Monday   3/27  PT/PTA completing this assessment DSG    PT Recommendations for Inpatient Admission  Activity/Level of Assist mechanical lift   ADL Recommendations assist of 2   bed level   Planned Treatment / Interventions  Plan for Next Visit As able   General Treatment / Interventions Therapeutic Exercise   Training Treatment / Interventions Functional Mobility Training;Strength Training   Education Treatment / Interventions Patient Education / Training    PT Discharge Summary  Physical Therapy Disposition Recommendation Short Term Rehab     Clearnce Hasten, [place] #[phone] of Care Overview/ Patient Status

## 2022-03-14 ENCOUNTER — Inpatient Hospital Stay: Admit: 2022-03-14 | Payer: PRIVATE HEALTH INSURANCE

## 2022-03-14 LAB — CBC WITHOUT DIFFERENTIAL
BKR WAM ANALYZER ANC: 4.14 x 1000/ÂµL (ref 2.00–7.60)
BKR WAM HEMATOCRIT (2 DEC): 31 % — ABNORMAL LOW (ref 35.00–45.00)
BKR WAM HEMOGLOBIN: 9.7 g/dL — ABNORMAL LOW (ref 11.7–15.5)
BKR WAM MCH (PG): 26.6 pg — ABNORMAL LOW (ref 27.0–33.0)
BKR WAM MCHC: 31.3 g/dL (ref 31.0–36.0)
BKR WAM MCV: 85.2 fL (ref 80.0–100.0)
BKR WAM MPV: 10 fL (ref 8.0–12.0)
BKR WAM PLATELETS: 332 x1000/ÂµL (ref 150–420)
BKR WAM RDW-CV: 18.1 % — ABNORMAL HIGH (ref 11.0–15.0)
BKR WAM RED BLOOD CELL COUNT.: 3.64 M/ÂµL — ABNORMAL LOW (ref 4.00–6.00)
BKR WAM WHITE BLOOD CELL COUNT: 7.8 x1000/ÂµL (ref 4.0–11.0)

## 2022-03-14 LAB — BASIC METABOLIC PANEL
BKR ANION GAP: 9 (ref 7–17)
BKR BLOOD UREA NITROGEN: 10 mg/dL (ref 8–23)
BKR BUN / CREAT RATIO: 37 — ABNORMAL HIGH (ref 8.0–23.0)
BKR CALCIUM: 9.3 mg/dL (ref 8.8–10.2)
BKR CHLORIDE: 103 mmol/L (ref 98–107)
BKR CO2: 21 mmol/L (ref 20–30)
BKR CREATININE: 0.27 mg/dL — ABNORMAL LOW (ref 0.40–1.30)
BKR EGFR, CREATININE (CKD-EPI 2021): >60 mL/min/{1.73_m2} (ref >=60)
BKR GLUCOSE: 108 mg/dL — ABNORMAL HIGH (ref 70–100)
BKR POTASSIUM: 3.9 mmol/L (ref 3.3–5.3)
BKR SODIUM: 133 mmol/L — ABNORMAL LOW (ref 136–144)

## 2022-03-14 LAB — MAGNESIUM: BKR MAGNESIUM: 2 mg/dL (ref 1.7–2.4)

## 2022-03-14 LAB — ALBUMIN: BKR ALBUMIN: 2.9 g/dL — ABNORMAL LOW (ref 3.6–4.9)

## 2022-03-14 LAB — C-REACTIVE PROTEIN     (CRP): BKR C-REACTIVE PROTEIN, HIGH SENSITIVITY: 25.8 mg/L — ABNORMAL HIGH

## 2022-03-14 LAB — COLLAGEN TYPE I C-TELOPEPTIDE (CTX): COLLAGEN TYPE I C-TELOPEPTIDE (CTX): 1049 pg/mL

## 2022-03-14 LAB — PROCALCITONIN     (BH GH LMW Q YH): BKR PROCALCITONIN: 0.15 ng/mL

## 2022-03-14 MED ORDER — OXACILLIN 2GM MBP
INTRAVENOUS | Status: DC
Start: 2022-03-14 — End: 2022-03-15
  Administered 2022-03-15 (×3): 50.000 mL/h via INTRAVENOUS

## 2022-03-14 NOTE — Plan of Care
Plan of Care Overview/ Patient Status    Problem: Adult Inpatient Plan of CareGoal: Readiness for Transition of CareOutcome: Interventions implemented as appropriate Pt discussed during TC rounds, not medically ready for discharge. Hypercalcemia has resolved, pt spiking low grade fevers with negative work-up. Sister working on T-19, Environmental education officer, and conservator process which is required to begin [employer]. Elinor Parkinson, RN, MSNCare ManagerMHB: [phone]

## 2022-03-14 NOTE — Care Coordination-Inpatient
10:46 AMWriter attempted to call sister of patient Danielle [name]) to discuss updates on the T19 application process and conservatorship, sister not available and unable to leave a voicemail as mailbox is full.TC will continue outreach and follow up.Gareth Morgan	Transition CoordinatorMHB: [phone]

## 2022-03-14 NOTE — Other
-  CONSULT  REQUEST  DOCUMENTATION-[facility] NOTE-Type of consult: [facility] Podiatry -New Consult: [id number] [name] /Location: 9822/9822-A / Brief Clinical Question: Right foot wound /cellultis--fever/Callback Cell Phone: [phone] / Please confirm receipt of this message by texting back ?OK?-1 - Mobile Heartbeat message sent to Donne Hazel at 4:34 PM. Received response at 1642.-Lupe Handley Dick3/24/20234:34 [facility] [phone]

## 2022-03-14 NOTE — Plan of Care
[place] [facility]scSpiritual Care NotePurpose:   Observation: People present/  Emotional   Quality of Relational Support: Familiy In-Town and Involved     Subjective: Pt appreciated visit as no other spiritual distress was identified and no pastoral care needed at this time.  I gave the availibility of our 24/7 day services in the event they are needed. Spiritual Assessment:Relational SupportQuality of Relational Support: Familiy In-Town and Involved Religious Affiliation: [employer]  Spiritual Resources: Angelina Pih Religious Practices: Faith Sharing  Spiritual Interventions:Spiritual Intervention Index**Date of Spiritual Visit: 03/24/23Visit Type: Follow Up VisitIntervention Type: Spiritual VisitResponding Chaplain: Unit ChaplainSpiritual/Religious Support Provided: Companionship, Introduction to [employer], Spiritual Support, HospitalityOutcome: OUTCOMES: Expressed Spiritual/Religious Resources or Distress: Expressed gratitude, Expressed hope OUTCOMES: Expressed Emotional Resources or Distress: Emotions Expressed/Distress Reduced OUTCOMES: Progressed Spiritually: Established chaplain relationship OUTCOMES: Progressed Emotionally or Physically: Increased comfort, Increased satisfaction     Plan: Spiritual Care remain available as needed.  If any of the following needs arise, please contact spiritual care: N: New diagnosisE: Emotional / spiritual distressE: Existential distressD: Decision making / goals of care meetingsS: Support for staff and patients' loved ones C: Compromised copingA: Anxiety / stress / grief / loneliness R: Religious / cultural / ritual needsE: End-of-life care / death / dying   Total Consult Time: 15 minutes	Signed: Chaplain:  Rev. Diem Dicocco LawsonMobile Heart Beat [phone]: Mon-Fri. 8:00 am - 4:30 pm.  Please page the on-call chaplain 24/7 for immediate emotional/spiritual/religious support between visits using pager [phone]/24/20233:04 PM

## 2022-03-14 NOTE — Progress Notes
[facility]	 [facility]	Medicine Progress NoteAttending Provider: Donald Siva Berkley Harvey, MDPCP: Pcp, Does Not Have A NoneHospital Day: 31 Patient examined by me: 3/24/2023Subjective: CC: Fever, unspecified fever cause Interim History: Patient seen and examined at bedside. Patient feeling okay, appears quiet. No cough, dyspnea or dysuria reported. Pleasantly confused.Overnight Events: NoneReview of Systems Unable to perform ROS: Mental acuity Review of Allergies/Meds/Hx: I have reviewed the patient's: allergies, current scheduled medications, current infusions, current prn medications, past medical history, past surgical history, family history, social history and prior to admission medications.Scheduled Meds:Current Facility-Administered Medications Medication Dose Route Frequency Provider Last Rate Last Admin ? [Held by provider] apixaban (ELIQUIS) tablet 5 mg  5 mg Per G Tube Q12H Littie Deeds, MD     ? Jerrel Ivory Plus oral powder packet  1 packet Per G Tube BID Noe Goyer, Berkley Harvey, MD   1 packet at 03/14/22 [phone] ? chlorhexidine gluconate (PERIDEX) 0.12 % solution 15 mL  15 mL Mouth/Throat BID Dorisann Frames, MD   15 mL at 03/14/22 9562 ? Elemental iron 60 mg (300 mg Ferrous sulfate) / 5 mL oral liquid 45 mg  45 mg Per G Tube BID Dorisann Frames, MD   45 mg at 03/14/22 1308 ? enoxaparin (LOVENOX) injection 80 mg  80 mg Subcutaneous Q12H Chaar, Abdelkader, MD   80 mg at 03/14/22 0839 ? famotidine (PEPCID) suspension (8 mg/mL) 20 mg  20 mg Per G Tube Daily Dorisann Frames, MD   20 mg at 03/14/22 6578 ? folic acid (FOLVITE) tablet 1 mg  1 mg Per G Tube Daily Dorisann Frames, MD   1 mg at 03/14/22 4696 ? niacin Immediate Release tablet 100 mg  100 mg Per G Tube Nightly Dorisann Frames, MD   100 mg at 03/13/22 2210 ? potassium bicarbonate-citric acid (EFFER-K, K-LYTE) effervescent tablet for oral solution 25 mEq  25 mEq Oral Daily Matilda Fleig, Berkley Harvey, MD   25 mEq at 03/14/22 2952 ? pyridoxine (vitamin B6) (B-6) tablet 100 mg  100 mg Per G Tube Daily Dorisann Frames, MD   100 mg at 03/14/22 8413 ? [Held by provider] rosuvastatin (CRESTOR) tablet 10 mg  10 mg Per G Tube Nightly Dorisann Frames, MD   10 mg at 02/14/22 2245 ? sodium chloride 0.9 % flush 3 mL  3 mL IV Push Q8H Dorisann Frames, MD   3 mL at 03/14/22 2440 ? thiamine (VITAMIN B1) tablet 100 mg  100 mg Per G Tube BID Dorisann Frames, MD   100 mg at 03/14/22 1027 Continuous Infusions:PRN Meds:.acetaminophen, dextrose (GLUCOSE) oral liquid 15 g **OR** fruit juice **OR** skim milk, dextrose (GLUCOSE) oral liquid 30 g **OR** fruit juice, dextrose injection, dextrose injection, glucagon, ondansetron **OR** ondansetron (ZOFRAN) IV Push, sodium chlorideCurrent Facility-Administered Medications Medication Dose Route Frequency Last Rate ? acetaminophen  650 mg Per G Tube Q6H PRN   ? [Held by provider] apixaban  5 mg Per G Tube Q12H   ? banana flakes-t-galactooligos.  1 packet Per G Tube BID   ? chlorhexidine gluconate  15 mL Mouth/Throat BID   ? dextrose (GLUCOSE) oral liquid 15 g  15 g Oral Q15 MIN PRN    Or ? fruit juice  120 mL Oral Q15 MIN PRN    Or ? skim milk  240 mL Oral Q15 MIN PRN   ? dextrose (GLUCOSE) oral liquid 30 g  30 g  Oral Q15 MIN PRN    Or ? fruit juice  240 mL Oral Q15 MIN PRN   ? dextrose injection  12.5 g Intravenous Q15 MIN PRN   ? dextrose injection  25 g Intravenous Q15 MIN PRN   ? Elemental iron  45 mg Per G Tube BID   ? enoxaparin (LOVENOX) treatment  80 mg Subcutaneous Q12H   ? famotidine  20 mg Per G Tube Daily   ? folic acid  1 mg Per G Tube Daily   ? glucagon  1 mg Intramuscular Once PRN   ? niacin  100 mg Per G Tube Nightly   ? ondansetron  4 mg Oral Q6H PRN    Or ? ondansetron (ZOFRAN) IV Push  4 mg IV Push Q6H PRN   ? potassium bicarbonate-citric acid  25 mEq Oral Daily   ? pyridoxine (vitamin B6)  100 mg Per G Tube Daily   ? [Held by provider] rosuvastatin  10 mg Per G Tube Nightly   ? sodium chloride  3 mL IV Push Q8H   ? sodium chloride  3 mL IV Push PRN for Line Care   ? thiamine  100 mg Per G Tube BID   Objective: Vitals: Patient Vitals for the past 24 hrs: Temp Temp src Pulse BP NIBP MAP (mmHg) Resp SpO2 03/14/22 0736 98.7 ?F (37.1 ?C) Axillary (!) 97 137/84 102 18 95 % 03/14/22 0545 (!) 100.2 ?F (37.9 ?C) Axillary (!) 109 (!) 154/86 108 18 94 % 03/14/22 0009 98.8 ?F (37.1 ?C) Oral (!) 104 114/73 87 18 96 % 03/13/22 1922 97.7 ?F (36.5 ?C) Oral -- -- -- -- -- 03/13/22 1614 (!) 100.8 ?F (38.2 ?C) Axillary (!) 101 129/77 94 18 98 % Physical Exam:  Constitutional: oriented to person, and no acute distress. Moderately confused.HEENT: Head: NCAT. Eyes: EOMI. PERRL. Neck: Neck supple. Cardiovascular: RRRS1S2.  No murmursPulmonary/Chest: BS +. Fine crackles.Abdominal: Soft. BS +. Non-tender, Non-distended. Ileostomy-no blood. PEG site c/d/i.GU: PurewickMusculoskeletal: Normal ROM. Neurological: alert and oriented to person. Non focal--BLE weakness & slight drop foot.Extremities: BLE + edema. Right foot wound--possible cellulitisSkin: No rashesLabs:Recent Labs   03/22/230547 03/23/230106 03/23/230513 03/24/230551 NA 139 136 138 133* K 3.1* 4.1 4.0 3.9 CL 107 105 107 103 CO2 24 24 23 21  BUN 7* 14 12 10  CREATININE 0.22* 0.26* 0.27* 0.27* Recent Labs   03/24/230551 WBC 7.8 HGB 9.7* HCT 31.00* PLT 332 Micro: Covid negativeProcedures:None IV Access: PIVDiagnostics:No new radiology.No results found for this or any previous visit.ECG/Tele Events: I have reviewed the patient's ECG as resulted in the EMR.Assessment: Assessment: 66 y.o. 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 Admitted 2/24 for LTAC placement and persistent encephalopathy. Found to be febrile without clear infectious source found, fevers resolved. Extensive work-up of encephalopathy and leg weakness unremarkable. Course c/b elevated LFTs which resolved, likely 2/2 DILI from ceftriaxone or Zosyn. Stable and awaiting LTC placement, but with ongoing significant hypercalcemia. Mental status appears overall stable/improving.Plan: Recurrent Fever/R-foot Cellultis:-transient fever-self resolving, no leukocytosis-CXR-no acute pathology-UA pending-start empiric ABX-Oxacillin (Cefazolin shortage) D1-consult Podiatry-check CRP, PCT, trend fever/CBC-Tylenol prnHypercalcemia:-Acute on chronic-PTH normal (28); work-up including PTH-rP (9), vitamin D testing, TSH, random cortisol unrevealing-Possibly d/t immobility or primary hyperparathyroidism; sent additional PTH testing with mineral metabolism level (send-out test)-iCa++-5.58 (03/11/22)-c/w IVF 100-- --will dc -Continue FWF via G-tube 400 ml q6H-repeat PTH 33.6, am Cortisol 8.9-Daily calcium (9.3)/albumin (2.9)--> corrected 10.18-wnl-trend Ca++ + Alb q3days while IP-Endocrine consult  recs: possible additional therapy pending PTH results. -Endo recommend --s/p Zolendronic acid 4 mg X1 (3/21)-pending  fasting CTx (Collagen Type-1 Telopeptide) (send out) Acute encephalopathy:LE weakness:-Unclear etiology, possibly prolonged delirium iso hospitalization from Nov 2022 - currentMRI brain, MRI total spine unremarkable-LP unremarkable-Paraneoplastic and autoimmune encephalopathy panels negative-Overall somewhat improved cognitive status during admission, remains alert and oriented x 1-2-Neurology rec'd EMG to complete work-up of LE weakness but pt declined; Neurology does not recommend any further testing and signed off 02/26/22?Elevated LFTs:-resolved-ABDCT- ?Multiple peritoneal nodules-LFTs normal on admission but new elevation after 3d of admission-Work-up showed elevated IgG, smooth muscle antibodies c/f possible autoimmune hepatitisInitial concern that imaging showed peritoneal nodules ?carcinomatosis-On review of imaging, nodules improving, more likely to be continued resolution of debris from perforation-Liver tests resolved without intervention, so most likely DILI from antibiotics (ceftriaxone and Zosyn)- Check LFTs weekly to ensure she is not evolving autoimmune hepatitis given multiple positive autoimmune markersRecurrent Hypokalemia:-appears c/w GI losses with watery ileostomy stool--Banatrol added-replaced via G-tube--will add daily dosing-trend UOP.BMP/Cr ?R lateral foot and L medial eminence deep tissue injuries:-Stable-Xray feet with degenerative changes but no acute findings- Podiatry following - no dressing requirements, WBAT to BLE.No?indication for podiatric surgical intervention at this time.  Patient may follow-up with Dr. Gazes within 3-4 weeks of discharge. - No need for abx at this time Hx Bilateral DVTs:- Apixaban 5mg  BID-HELD-c/w Lovenox 80 mg q12H?Elevated LFC ratio:Iron deficiency anemia:-Hematology consult &  signed off - elevated FLC ratio felt to be reactive, plasma cell disorder unlikely ?Transient Fevers and leukocytosis:- resolved-No clear source identified, possibly 2/2 DVT vs autoimmune?-S/p CTX 2/21 and Zosyn 2/21-2/25?Chronic conditions:-Rheumatoid Arthritis - has been off of MTX since November/acute illness, rheumatology signing off current ams/weakness not felt to be related to her RA, follow up outpatient -Bilateral LE DVT - ?provoked from recent surgery, cont lovenox, can resume apixaban at discharge?Hospital Issues:- F: none- E: replete PRN- N: Tube feeds- GI: bowel regimen- PPx: Lovenox 80 mg q12H (s/p Eliquis)- Code Status:Full ACLS?Disposition: LTC, family looking into T19 application FEN: diet, K>4, Mg>2 Lines: PIV Tubes: NoneMobility: pendingPT/OT: pending Disposition: pendingMedication Reconciliation: In ProgressCommunication: pendingSigned:Merced Brougham Vivi Martens, MD, MSc  Communication via [facility] Hospitalist Service3/24/2023, 12:26 PMTime > 35 Minutes : time spent obtaining history from patient or surrogate, review of medical records/prior chart documentation , examining/evaluating patient, ordering or performing treatments/interventions; ordering/reviewing laboratory studies, radiographic studies, pulse oximetry; re-evaluation of patient's condition and development of treatment plan, discussions with consultants, evaluation of patient's response to treatment. With 50% of time spent in care follow-up/care coordination, plan of care discussion with consulting specialities, and with the patient and/or patient's family/HCPOA/Surrogate Code Status Full Code DVT Prophylaxis Lovenox ADMIT MED REC In Progress PCP Pcp, Does Not Have A None Emergency contact Extended Emergency Contact InformationPrimary Emergency Contact: McCulley,ReneeHome Phone: [phone] Disposition TBD

## 2022-03-14 NOTE — Plan of Care
Plan of Care Overview/ Patient Status    Temp:  [97.7 ?F (36.5 ?C)-100.8 ?F (38.2 ?C)] 98.8 ?F (37.1 ?C)Pulse:  [90-104] 104Resp:  [16-18] 18BP: (113-134)/(73-86) 114/73SpO2:  [95 %-100 %] 96 %Device (Oxygen Therapy): room air1900 -0700?Neuro: A&Ox?UTAResp: WDL on room airCardiac:?TachycardiaGI/GU:?Ileostomy x2, PEG tubeMS:?Assist x 2 in bedSkin: See flowsheetsDiet: Tube Feed, no tray?Pain: None indicated?Scheduled medications were administered per MAR, tolerated well. Hourly rounds and q2 hr turn and repositioning performed. Safety maintained, call bell within reach bed alarm engaged. 03/14/2022 2:40 AM

## 2022-03-14 NOTE — Other
Called patient's sister [name]/HCP---no answer--mailbox full-unable to leave VM.

## 2022-03-14 NOTE — Plan of Care
Plan of Care Overview/ Patient Status    [name]/Ox1, calm, soft spoken at baseline, follows commands. Patient increasingly tachycardic up to 120s with low grade fever T-max 99.9 Patient warm to touch, otherwise denies symptoms. Provider notified, tylenol given. Podiatry consulted. Staph swab sent. Abx to start tonight. Bilateral foot dressings changed per orders. Left ileostomy with no output, right ileostomy with large loose stool. Incontinent of urine. Turned and repositioned q2h. 1850: Unable to obtain straight cath x1 s/t hip pain with leg extension. Pericare provided with new purewick setup to obtain clean catch specimen.

## 2022-03-15 LAB — URINE MICROSCOPIC     (BH GH LMW YH)
BKR RBC/HPF INSTRUMENT: 2 /HPF (ref 0–2)
BKR URINE SQUAMOUS EPITHELIAL CELLS, UA (NUMERIC): 1 /HPF (ref 0–5)
BKR WBC/HPF INSTRUMENT: 3 /HPF (ref 0–5)

## 2022-03-15 LAB — CBC WITHOUT DIFFERENTIAL
BKR WAM ANALYZER ANC: 3.66 x 1000/ÂµL (ref 2.00–7.60)
BKR WAM HEMATOCRIT (2 DEC): 33.7 % — ABNORMAL LOW (ref 35.00–45.00)
BKR WAM HEMOGLOBIN: 10.3 g/dL — ABNORMAL LOW (ref 11.7–15.5)
BKR WAM MCH (PG): 26.3 pg — ABNORMAL LOW (ref 27.0–33.0)
BKR WAM MCHC: 30.6 g/dL — ABNORMAL LOW (ref 31.0–36.0)
BKR WAM MCV: 86.2 fL (ref 80.0–100.0)
BKR WAM MPV: 10 fL (ref 8.0–12.0)
BKR WAM PLATELETS: 340 x1000/ÂµL (ref 150–420)
BKR WAM RDW-CV: 17.9 % — ABNORMAL HIGH (ref 11.0–15.0)
BKR WAM RED BLOOD CELL COUNT.: 3.91 M/ÂµL — ABNORMAL LOW (ref 4.00–6.00)
BKR WAM WHITE BLOOD CELL COUNT: 6.8 x1000/ÂµL (ref 4.0–11.0)

## 2022-03-15 LAB — COMPREHENSIVE METABOLIC PANEL
BKR A/G RATIO: 0.6 — ABNORMAL LOW (ref 1.0–2.2)
BKR ALANINE AMINOTRANSFERASE (ALT): 17 U/L (ref 10–35)
BKR ALBUMIN: 3 g/dL — ABNORMAL LOW (ref 3.6–4.9)
BKR ALKALINE PHOSPHATASE: 84 U/L (ref 9–122)
BKR ANION GAP: 9 (ref 7–17)
BKR ASPARTATE AMINOTRANSFERASE (AST): 26 U/L (ref 10–35)
BKR AST/ALT RATIO: 1.5
BKR BILIRUBIN TOTAL: 0.2 mg/dL (ref <=1.2)
BKR BLOOD UREA NITROGEN: 10 mg/dL (ref 8–23)
BKR BUN / CREAT RATIO: 35.7 — ABNORMAL HIGH (ref 8.0–23.0)
BKR CALCIUM: 9.5 mg/dL (ref 8.8–10.2)
BKR CHLORIDE: 103 mmol/L (ref 98–107)
BKR CO2: 22 mmol/L (ref 20–30)
BKR CREATININE: 0.28 mg/dL — ABNORMAL LOW (ref 0.40–1.30)
BKR EGFR, CREATININE (CKD-EPI 2021): >60 mL/min/{1.73_m2} (ref >=60)
BKR GLOBULIN: 5.2 g/dL — ABNORMAL HIGH (ref 2.3–3.5)
BKR GLUCOSE: 110 mg/dL — ABNORMAL HIGH (ref 70–100)
BKR POTASSIUM: 4 mmol/L (ref 3.3–5.3)
BKR PROTEIN TOTAL: 8.2 g/dL (ref 6.6–8.7)
BKR SODIUM: 134 mmol/L — ABNORMAL LOW (ref 136–144)

## 2022-03-15 LAB — URINALYSIS-MACROSCOPIC W/REFLEX MICROSCOPIC
BKR BILIRUBIN, UA: NEGATIVE
BKR BLOOD, UA: NEGATIVE
BKR GLUCOSE, UA: NEGATIVE
BKR KETONES, UA: NEGATIVE
BKR NITRITE, UA: POSITIVE — AB
BKR PH, UA: 7.5 (ref 5.5–7.5)
BKR PROTEIN, UA: NEGATIVE
BKR SPECIFIC GRAVITY, UA: 1.011 (ref 1.005–1.030)
BKR UROBILINOGEN, UA (MG/DL): <2 mg/dL (ref <=2.0)

## 2022-03-15 NOTE — Plan of Care
Problem: Adult Inpatient Plan of CareGoal: Plan of Care ReviewFlowsheets (Taken 03/11/2022 0800 by Deliah Goody, RN)Plan of Care Reviewed With: patient Plan of Care Overview/ Patient Status    0700-1900VSS on RA except 1x hypertension. A&Ox1-2, disoriented to time, place, and situation. No c/o pain. Pt has PEG tube requiring bolus feeds at 0900,1300, 1700, of Jevity 1.5, 367cc. Free Water Flushes of 400cc q6h were given at 1100 and 1600 (to offset bolus feed time). All medication given through PEG per MAR. Pt has illeostomy 2x. Left ilieostomy Danielle [name] to no output, (tan) drainage. Right ostomy collecting stool, output loose. Purewick in place for bladder incontinence. Pt given full bath today. T&R q2h. Fall prevention program inplace, bed in lowest position, call bell within reach, bed alarm on, will continue to fdollow POC. Hourly rounding completed. Safety maintained.

## 2022-03-15 NOTE — Progress Notes
[facility]	 [facility]	Medicine Progress NoteAttending Provider: Donald Siva Berkley Harvey, MDPCP: Pcp, Does Not Have A NoneHospital Day: 4 Patient examined by me: 3/25/2023Subjective: CC: Fever, unspecified fever cause Interim History: Patient seen and examined at bedside. Patient more alert & interactive today. Offers no complaints. No cough, dyspnea or dysuria reported. Pleasantly confused.Overnight Events: NoneReview of Systems Unable to perform ROS: Mental acuity Review of Allergies/Meds/Hx: I have reviewed the patient's: allergies, current scheduled medications, current infusions, current prn medications, past medical history, past surgical history, family history, social history and prior to admission medications.Scheduled Meds:Current Facility-Administered Medications Medication Dose Route Frequency Provider Last Rate Last Admin ? [Held by provider] apixaban (ELIQUIS) tablet 5 mg  5 mg Per G Tube Q12H Littie Deeds, MD     ? Jerrel Ivory Plus oral powder packet  1 packet Per G Tube BID Kerissa Coia, Berkley Harvey, MD   1 packet at 03/15/22 [phone] ? chlorhexidine gluconate (PERIDEX) 0.12 % solution 15 mL  15 mL Mouth/Throat BID Dorisann Frames, MD   15 mL at 03/15/22 0901 ? Elemental iron 60 mg (300 mg Ferrous sulfate) / 5 mL oral liquid 45 mg  45 mg Per G Tube BID Dorisann Frames, MD   45 mg at 03/15/22 0901 ? enoxaparin (LOVENOX) injection 80 mg  80 mg Subcutaneous Q12H Chaar, Abdelkader, MD   80 mg at 03/15/22 0859 ? famotidine (PEPCID) suspension (8 mg/mL) 20 mg  20 mg Per G Tube Daily Dorisann Frames, MD   20 mg at 03/15/22 0901 ? folic acid (FOLVITE) tablet 1 mg  1 mg Per G Tube Daily Dorisann Frames, MD   1 mg at 03/15/22 0901 ? niacin Immediate Release tablet 100 mg  100 mg Per G Tube Nightly Dorisann Frames, MD   100 mg at 03/14/22 2145 ? potassium bicarbonate-citric acid (EFFER-K, K-LYTE) effervescent tablet for oral solution 25 mEq  25 mEq Oral Daily Serai Tukes, Berkley Harvey, MD   25 mEq at 03/15/22 0901 ? pyridoxine (vitamin B6) (B-6) tablet 100 mg  100 mg Per G Tube Daily Dorisann Frames, MD   100 mg at 03/15/22 0901 ? [Held by provider] rosuvastatin (CRESTOR) tablet 10 mg  10 mg Per G Tube Nightly Dorisann Frames, MD   10 mg at 02/14/22 2245 ? sodium chloride 0.9 % flush 3 mL  3 mL IV Push Q8H Dorisann Frames, MD   3 mL at 03/15/22 7846 ? thiamine (VITAMIN B1) tablet 100 mg  100 mg Per G Tube BID Dorisann Frames, MD   100 mg at 03/15/22 0901 Continuous Infusions:PRN Meds:.acetaminophen, dextrose (GLUCOSE) oral liquid 15 g **OR** fruit juice **OR** skim milk, dextrose (GLUCOSE) oral liquid 30 g **OR** fruit juice, dextrose injection, dextrose injection, glucagon, ondansetron **OR** ondansetron (ZOFRAN) IV Push, sodium chlorideCurrent Facility-Administered Medications Medication Dose Route Frequency Last Rate ? acetaminophen  650 mg Per G Tube Q6H PRN   ? [Held by provider] apixaban  5 mg Per G Tube Q12H   ? banana flakes-t-galactooligos.  1 packet Per G Tube BID   ? chlorhexidine gluconate  15 mL Mouth/Throat BID   ? dextrose (GLUCOSE) oral liquid 15 g  15 g Oral Q15 MIN PRN    Or ? fruit juice  120 mL Oral Q15 MIN PRN    Or ? skim milk  240 mL Oral Q15 MIN PRN   ? dextrose (GLUCOSE) oral liquid 30  g  30 g Oral Q15 MIN PRN    Or ? fruit juice  240 mL Oral Q15 MIN PRN   ? dextrose injection  12.5 g Intravenous Q15 MIN PRN   ? dextrose injection  25 g Intravenous Q15 MIN PRN   ? Elemental iron  45 mg Per G Tube BID   ? enoxaparin (LOVENOX) treatment  80 mg Subcutaneous Q12H   ? famotidine  20 mg Per G Tube Daily   ? folic acid  1 mg Per G Tube Daily   ? glucagon  1 mg Intramuscular Once PRN   ? niacin  100 mg Per G Tube Nightly   ? ondansetron  4 mg Oral Q6H PRN    Or ? ondansetron (ZOFRAN) IV Push  4 mg IV Push Q6H PRN   ? potassium bicarbonate-citric acid  25 mEq Oral Daily   ? pyridoxine (vitamin B6)  100 mg Per G Tube Daily   ? [Held by provider] rosuvastatin  10 mg Per G Tube Nightly   ? sodium chloride  3 mL IV Push Q8H   ? sodium chloride  3 mL IV Push PRN for Line Care   ? thiamine  100 mg Per G Tube BID   Objective: Vitals: Patient Vitals for the past 24 hrs: Temp Temp src Pulse BP NIBP MAP (mmHg) Resp SpO2 03/15/22 0823 97.5 ?F (36.4 ?C) Oral (!) 94 130/74 93 16 97 % 03/15/22 0513 98.4 ?F (36.9 ?C) Oral (!) 105 119/82 95 18 99 % 03/15/22 0013 98.3 ?F (36.8 ?C) Oral (!) 112 117/69 85 20 100 % 03/14/22 2035 98 ?F (36.7 ?C) Oral (!) 101 128/79 95 20 96 % 03/14/22 1614 99.9 ?F (37.7 ?C) Axillary (!) 121 (!) 156/85 108 18 94 % 03/14/22 1249 97.6 ?F (36.4 ?C) Oral (!) 110 120/74 90 18 96 % Physical Exam:  Constitutional: oriented to person, and no acute distress. Moderately confused.HEENT: Head: NCAT. Eyes: EOMI. PERRL. Neck: Neck supple. Cardiovascular: RRRS1S2.  No murmursPulmonary/Chest: BS +. Fine crackles.Abdominal: Soft. BS +. Non-tender, Non-distended. Ileostomy-no blood. PEG site c/d/i.GU: PurewickMusculoskeletal: Normal ROM. Neurological: alert and oriented to person. Non focal--BLE weakness & slight drop foot.Extremities: BLE + edema. Right foot wound--possible cellulitisSkin: No rashesLabs:Recent Labs   03/23/230106 03/23/230513 03/24/230551 03/25/230517 NA 136 138 133* 134* K 4.1 4.0 3.9 4.0 CL 105 107 103 103 CO2 24 23 21 22  BUN 14 12 10 10  CREATININE 0.26* 0.27* 0.27* 0.28* Recent Labs   03/24/230551 03/25/230517 WBC 7.8 6.8 HGB 9.7* 10.3* HCT 31.00* 33.70* PLT 332 340 Micro: Covid negativeProcedures:None IV Access: PIVDiagnostics:No new radiology.No results found for this or any previous visit.ECG/Tele Events: I have reviewed the patient's ECG as resulted in the EMR.Assessment: Assessment: 66 y.o. 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 Admitted 2/24 for LTAC placement and persistent encephalopathy. Found to be febrile without clear infectious source found, fevers resolved. Extensive work-up of encephalopathy and leg weakness unremarkable. Course c/b elevated LFTs which resolved, likely 2/2 DILI from ceftriaxone or Zosyn. Stable and awaiting LTC placement, but with ongoing significant hypercalcemia. Mental status appears overall stable/improving.Plan: Recurrent Fever/R-foot Cellultis:-transient fever-self resolving, no leukocytosis-CXR-no acute pathology-UA-nenign-empiric ABX-Oxacillin (Cefazolin shortage)--stopped with benign infectious work-up-consulted Podiatry--appreciated recs-CRP 25.8, PCT-benign -trend fever/CBC-Tylenol prnHypercalcemia:-Acute on chronic-PTH normal (28); work-up including PTH-rP (9), vitamin D testing, TSH, random cortisol unrevealing-Possibly d/t immobility or primary hyperparathyroidism; sent additional PTH testing with mineral metabolism level (send-out test)-iCa++-5.58 (03/11/22)-c/w IVF 100-- --will  dc -Continue FWF via G-tube 400 ml q6H-repeat PTH 33.6, am Cortisol 8.9-Daily calcium (9.3)/albumin (2.9)--> corrected 10.18-wnl-trend Ca++ + Alb q3days while IP-Endocrine consult  recs: possible additional therapy pending PTH results. -s/p Zolendronic acid 4 mg X1 (3/21)-fasting CTx (Collagen Type-1 Telopeptide)-elevated Acute encephalopathy:LE weakness:-Unclear etiology, possibly prolonged delirium iso hospitalization from Nov 2022 - currentMRI brain, MRI total spine unremarkable-LP unremarkable-Paraneoplastic and autoimmune encephalopathy panels negative-Overall somewhat improved cognitive status during admission, remains alert and oriented x 1-2-Neurology rec'd EMG to complete work-up of LE weakness but pt declined; Neurology does not recommend any further testing and signed off 02/26/22?Elevated LFTs:-resolved-ABDCT- ?Multiple peritoneal nodules-LFTs normal on admission but new elevation after 3d of admission-Work-up showed elevated IgG, smooth muscle antibodies c/f possible autoimmune hepatitisInitial concern that imaging showed peritoneal nodules ?carcinomatosis-On review of imaging, nodules improving, more likely to be continued resolution of debris from perforation-Liver tests resolved without intervention, so most likely DILI from antibiotics (ceftriaxone and Zosyn)- Check LFTs weekly to ensure she is not evolving autoimmune hepatitis given multiple positive autoimmune markersRecurrent Hypokalemia:-resolved-appears c/w GI losses with watery ileostomy stool--Banatrol added-replaced via G-tube--will add daily dosing-trend UOP/BMP/Cr prn?R lateral foot and L medial eminence deep tissue injuries:-Stable-Xray feet with degenerative changes but no acute findings- Podiatry following - no dressing requirements, WBAT to BLE.No?indication for podiatric surgical intervention at this time.  Patient may follow-up with Dr. Gazes within 3-4 weeks of discharge. - No need for abx at this time Hx Bilateral DVTs:- Apixaban 5mg  BID-HELD-c/w Lovenox 80 mg q12H?Elevated LFC ratio:Iron deficiency anemia:-Hematology consult &  signed off - elevated FLC ratio felt to be reactive, plasma cell disorder unlikely ?Transient Fevers and leukocytosis:- resolved-No clear source identified, possibly 2/2 DVT vs autoimmune?-S/p CTX 2/21 and Zosyn 2/21-2/25?Chronic conditions:-Rheumatoid Arthritis - has been off of MTX since November/acute illness, rheumatology signing off current ams/weakness not felt to be related to her RA, follow up outpatient -Bilateral LE DVT - ?provoked from recent surgery, cont lovenox, can resume apixaban at discharge?Hospital Issues:- F: none- E: replete PRN- N: Tube feeds- GI: bowel regimen- PPx: Lovenox 80 mg q12H (s/p Eliquis)- Code Status:Full ACLS?Disposition: LTC, family looking into T19 application FEN: diet, K>4, Mg>2 Lines: PIV Tubes: NoneMobility: pendingPT/OT: pending Disposition: pendingMedication Reconciliation: In ProgressCommunication: pendingSigned:Jerald Hennington Vivi Martens, MD, MSc  Communication via [facility] Hospitalist Service3/25/2023, 12:22 PMTime > 35 Minutes : time spent obtaining history from patient or surrogate, review of medical records/prior chart documentation , examining/evaluating patient, ordering or performing treatments/interventions; ordering/reviewing laboratory studies, radiographic studies, pulse oximetry; re-evaluation of patient's condition and development of treatment plan, discussions with consultants, evaluation of patient's response to treatment. With 50% of time spent in care follow-up/care coordination, plan of care discussion with consulting specialities, and with the patient and/or patient's family/HCPOA/Surrogate Code Status Full Code DVT Prophylaxis Lovenox ADMIT MED REC In Progress PCP Pcp, Does Not Have A None Emergency contact Extended Emergency Contact InformationPrimary Emergency Contact: McCulley,ReneeHome Phone: [phone] Disposition TBD

## 2022-03-15 NOTE — Plan of Care
Plan of Care Overview/ Patient Status    1900-0700:  Pt A&O to self. Pt able to respond to questions. VSS on RA. No signs of pain or respiratory distress. Scheduled meds given per order. Ileostomy in place +flatus and stool.  Pt incontinent of urine, Purewick changed and in place. Dressing changed to L ileostomy.  PEG tube?in place.?Bolus TF given 1.5 cartons of Pivot 1.5.??Dressings clean dry and intact. Blood glucose monitoring.?Oral care?provided.??Podus boots in place. T&Rq2h. Hourly rounding. Pt resting in bed. Safety precautions maintained, bed alarm on, call bell in reach, will ctm and follow nursing poc.?Problem: Adult Inpatient Plan of CareGoal: Plan of Care ReviewOutcome: Interventions implemented as appropriateFlowsheets (Taken 03/15/2022 0341)Progress: improvingPlan of Care Reviewed With: patient

## 2022-03-16 LAB — MSSA / MRSA SCREEN BY CULTURE   (BH GH LMW YH)
BKR MRSA MEDIA: POSITIVE — AB
BKR MSSA MEDIA (SAID): POSITIVE — AB

## 2022-03-16 NOTE — Progress Notes
[facility]	Podiatry Progress NoteDate: 3/24/2023Patient Name: Margueritte Guthridge: [name] Attending Provider: Thurnell Lose, MDCC: Bilateral foot woundsSubjective: Pt seen at bedside this PM in NAEO.  Primary team requested for re-evaluation of patient's wound for possible source of her low-grade fever.  Patient in good spirits this during this encounter.PMH: PSH: No past medical history on file. No past surgical history on file. Social History: Family History: Social History Tobacco Use  Smoking status: Not on file  Smokeless tobacco: Not on file Substance Use Topics  Alcohol use: Not on file  No family history on file. Allergies: Patient has no known allergies.Review of Systems: Unable to assess 2/2 AMSVitals: Vitals:  03/14/22 1614 BP: (!) 156/85 Pulse: (!) 121 Resp: 18 Temp: 99.9 ?F (37.7 ?C) Physical Exam: Gen: NADHead: NormocephalicPsych: AAO x 2Chest: CTAB, no r/r/wCardio: RRR, S1, S2, no m/r/gAbd: Soft NT, ND, +bsVascular: DP/PT pulses palpable. Normal CRT to all digits. Foot is warm to touch. Pedal hair growth is absentNeuro: Unable to assess MSK: Unable to ROM digits when asked, unable to participate in MMTDerm: Deep tissue injury noted to the Right lateral foot without any open lesions. No drainage, purulence, tunneling, crepitus, fluctuance, malodor, proximal lymphagitic streaking, or other acute clinical signs of infection.  DTI shape size and look very similar to prior physical exam Labs: Lab Results Component Value Date  WBC 7.8 03/14/2022  HGB 9.7 (L) 03/14/2022  HCT 31.00 (L) 03/14/2022  CREATININE 0.27 (L) 03/14/2022  BUN 10 03/14/2022  CO2 21 03/14/2022  PLT 332 03/14/2022  INR 1.10 02/18/2022  GLU 108 (H) 03/14/2022  K 3.9 03/14/2022  NA 133 (L) 03/14/2022  CL 103 03/14/2022  ALT 16 03/10/2022 AST 27 03/10/2022  TSH 1.670 02/17/2022  HSCRP 10.7 (H) 02/19/2022  SEDRATE 98 (H) 02/19/2022 Imaging: XR Foot Bilateral AP Lateral and ObliqueResult Date: 3/7/2023AP, LATERAL AND OBLIQUE BILATERAL FOOT X-RAY Clinical Information: Baseline assessment. Assess for erosions. Comparison: None Findings: Left foot: The overall bony mineralization appears qualitatively diminished. There is evidence of joint space narrowing indicative of arthropathy involving the interphalangeal joint and metatarsal phalangeal joint of the first digit as well as the DIP and PIP joints of the remaining digits. There is mild hallux valgus as well as bunion formation. The moderate degenerative changes are noted in the midfoot. There is no acute osseous injury. Right foot: The overall bony mineralization appears qualitatively diminished. There is evidence of joint space narrowing indicative of arthropathy involving the interphalangeal joint and metatarsal phalangeal joint of the first digit as well as the DIP and PIP joints of the remaining digits. There is moderate to severe hallux valgus as well as bunion formation. The moderate degenerative changes are noted in the midfoot. There is no acute osseous injury. 1. Diffuse degenerative changes without evidence of acute osseous injury. There is no evidence of bony erosion. Reported And Signed By: Merdis Delay, MD  [facility] Micro/Path: None Assessment/Plan: Alanna Storti is a 66 y.o. female with Right lateral foot and and resolved Left medial eminence deep tissue injuries, stable. No open wounds or SOI.  - Revaluated patient's wound.  Deep tissue injury seems to be resolving at this time.  No open wounds around the deep tissue injury, no crepitus fluctuance or drainage noted.  No other clinical signs of infection noted. Mild periwound erythema most likely due to pressure rather than infection. - At this time the foot is not the cause of the patient's low-grade fever.- Would recommend broadening differential to urinary or  GI causes for low-grade fever.-No dressing needed on the left medial eminence- Appreciate nursing continuing  With a Betadine with overlying DSD to the right DTI- Recommend WBAT to BLE- Antibiotics per primary team - none indicated per podiatry standpointDispo: No indication for podiatric surgical intervention at this time.  Patient may follow-up with Dr. Trace Wirick within 3-4 weeks of discharge.  Podiatry will continue to follow peripherally while in house. Please contact the resident on call with any acute changes, new issues, or questions.Will discuss with attending, Dr. Darral Dash page on-call podiatry resident with questions:SEE AMION FOR CALL SCHEDULE Donne Hazel, DPM PGY-2 [facility] Podiatric SurgeryElectronically Signed by Donne Hazel, DPM, March 24, 2023Discussed with resident, agree with plan.Electronically Signed by Donn Pierini, DPM, March 15, 2022

## 2022-03-16 NOTE — Other
Updated brother [name] on plan of care over the phone. Unable to reach sister or leave message on machine. Mailbox full. Einar Crow, APRN

## 2022-03-16 NOTE — Progress Notes
[facility]	 [facility]	Medicine Progress NoteAttending Provider: Donald Siva Berkley Harvey, MDPCP: Pcp, Does Not Have A NoneHospital Day: 66 Patient examined by me: 3/26/2023Subjective: CC: Fever, unspecified fever cause Interim History: Patient seen and examined at bedside. Patient doing well today with no complaints. Pleasantly intermittently confused.Overnight Events: NoneReview of Systems Respiratory: Negative for shortness of breath.  Cardiovascular: Negative for chest pain and palpitations. Gastrointestinal: Negative for abdominal pain. Review of Allergies/Meds/Hx: I have reviewed the patient's: allergies, current scheduled medications, current infusions, current prn medications, past medical history, past surgical history, family history, social history and prior to admission medications.Scheduled Meds:Current Facility-Administered Medications Medication Dose Route Frequency Provider Last Rate Last Admin ? [Held by provider] apixaban (ELIQUIS) tablet 5 mg  5 mg Per G Tube Q12H Littie Deeds, MD     ? Jerrel Ivory Plus oral powder packet  1 packet Per G Tube BID Tadeo Besecker, Berkley Harvey, MD   1 packet at 03/16/22 [phone] ? chlorhexidine gluconate (PERIDEX) 0.12 % solution 15 mL  15 mL Mouth/Throat BID Dorisann Frames, MD   15 mL at 03/16/22 0836 ? Elemental iron 60 mg (300 mg Ferrous sulfate) / 5 mL oral liquid 45 mg  45 mg Per G Tube BID Dorisann Frames, MD   45 mg at 03/16/22 9604 ? enoxaparin (LOVENOX) injection 80 mg  80 mg Subcutaneous Q12H Chaar, Abdelkader, MD   80 mg at 03/16/22 0836 ? famotidine (PEPCID) suspension (8 mg/mL) 20 mg  20 mg Per G Tube Daily Dorisann Frames, MD   20 mg at 03/16/22 5409 ? folic acid (FOLVITE) tablet 1 mg  1 mg Per G Tube Daily Dorisann Frames, MD   1 mg at 03/16/22 8119 ? niacin Immediate Release tablet 100 mg  100 mg Per G Tube Nightly Dorisann Frames, MD   100 mg at 03/15/22 2106 ? potassium bicarbonate-citric acid (EFFER-K, K-LYTE) effervescent tablet for oral solution 25 mEq  25 mEq Oral Daily Starasia Sinko, Berkley Harvey, MD   25 mEq at 03/16/22 0836 ? pyridoxine (vitamin B6) (B-6) tablet 100 mg  100 mg Per G Tube Daily Dorisann Frames, MD   100 mg at 03/16/22 1478 ? [Held by provider] rosuvastatin (CRESTOR) tablet 10 mg  10 mg Per G Tube Nightly Dorisann Frames, MD   10 mg at 02/14/22 2245 ? sodium chloride 0.9 % flush 3 mL  3 mL IV Push Q8H Dorisann Frames, MD   3 mL at 03/15/22 2956 ? thiamine (VITAMIN B1) tablet 100 mg  100 mg Per G Tube BID Dorisann Frames, MD   100 mg at 03/16/22 0836 Continuous Infusions:PRN Meds:.acetaminophen, dextrose (GLUCOSE) oral liquid 15 g **OR** fruit juice **OR** skim milk, dextrose (GLUCOSE) oral liquid 30 g **OR** fruit juice, dextrose injection, dextrose injection, glucagon, ondansetron **OR** ondansetron (ZOFRAN) IV Push, sodium chlorideCurrent Facility-Administered Medications Medication Dose Route Frequency Last Rate ? acetaminophen  650 mg Per G Tube Q6H PRN   ? [Held by provider] apixaban  5 mg Per G Tube Q12H   ? banana flakes-t-galactooligos.  1 packet Per G Tube BID   ? chlorhexidine gluconate  15 mL Mouth/Throat BID   ? dextrose (GLUCOSE) oral liquid 15 g  15 g Oral Q15 MIN PRN    Or ? fruit juice  120 mL Oral Q15 MIN PRN    Or ? skim milk  240 mL Oral Q15 MIN PRN   ?  dextrose (GLUCOSE) oral liquid 30 g  30 g Oral Q15 MIN PRN    Or ? fruit juice  240 mL Oral Q15 MIN PRN   ? dextrose injection  12.5 g Intravenous Q15 MIN PRN   ? dextrose injection  25 g Intravenous Q15 MIN PRN   ? Elemental iron  45 mg Per G Tube BID   ? enoxaparin (LOVENOX) treatment  80 mg Subcutaneous Q12H   ? famotidine  20 mg Per G Tube Daily   ? folic acid  1 mg Per G Tube Daily   ? glucagon  1 mg Intramuscular Once PRN   ? niacin 100 mg Per G Tube Nightly   ? ondansetron  4 mg Oral Q6H PRN    Or ? ondansetron (ZOFRAN) IV Push  4 mg IV Push Q6H PRN   ? potassium bicarbonate-citric acid  25 mEq Oral Daily   ? pyridoxine (vitamin B6)  100 mg Per G Tube Daily   ? [Held by provider] rosuvastatin  10 mg Per G Tube Nightly   ? sodium chloride  3 mL IV Push Q8H   ? sodium chloride  3 mL IV Push PRN for Line Care   ? thiamine  100 mg Per G Tube BID   Objective: Vitals: Patient Vitals for the past 24 hrs: Temp Temp src Pulse BP NIBP MAP (mmHg) Resp SpO2 03/16/22 1140 98.8 ?F (37.1 ?C) Axillary (!) 97 106/69 81 16 98 % 03/16/22 0823 99.1 ?F (37.3 ?C) Axillary (!) 92 112/72 85 16 96 % 03/16/22 0421 97.5 ?F (36.4 ?C) Axillary (!) 98 108/77 87 18 96 % 03/16/22 0014 98.8 ?F (37.1 ?C) Axillary (!) 104 125/80 95 20 98 % 03/15/22 2037 98.1 ?F (36.7 ?C) Oral (!) 110 (!) 147/84 105 20 100 % 03/15/22 1629 97.9 ?F (36.6 ?C) Oral (!) 110 124/76 92 18 98 % Physical Exam:  Constitutional: oriented to person, and no acute distress. Intermittently confused.HEENT: Head: NCAT. Eyes: EOMI. PERRL. Neck: Neck supple. Cardiovascular: RRRS1S2.  No murmursPulmonary/Chest: BS +. Fine crackles.Abdominal: Soft. BS +. Non-tender, Non-distended. Ileostomy-no blood. PEG site c/d/i.GU: PurewickMusculoskeletal: Normal ROM. Neurological: alert and oriented to person. Non focal--BLE weakness & slight drop foot.Extremities: BLE + edema. Right foot wound--possible cellulitisSkin: No rashesLabs:Recent Labs   03/24/230551 03/25/230517 NA 133* 134* K 3.9 4.0 CL 103 103 CO2 21 22 BUN 10 10 CREATININE 0.27* 0.28* Recent Labs   03/24/230551 03/25/230517 WBC 7.8 6.8 HGB 9.7* 10.3* HCT 31.00* 33.70* PLT 332 340 Micro: Covid negativeProcedures:None IV Access: PIVDiagnostics:No new radiology.No results found for this or any previous visit.ECG/Tele Events: I have reviewed the patient's ECG as resulted in the EMR.Assessment: Assessment: 66 y.o. 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 Admitted 2/24 for LTAC placement and persistent encephalopathy. Found to be febrile without clear infectious source found, fevers resolved. Extensive work-up of encephalopathy and leg weakness unremarkable. Course c/b elevated LFTs which resolved, likely 2/2 DILI from ceftriaxone or Zosyn. Stable and awaiting LTC placement, but with ongoing significant hypercalcemia. Mental status appears overall stable/improving.Plan: Recurrent Fever/R-foot Cellultis:-transient fever resolved, no leukocytosis-No clear source identified, possibly 2/2 DVT vs autoimmune?-S/p CTX 2/21 and Zosyn 2/21-2/25-CXR-no acute pathology-UA-nenign-empiric ABX-Oxacillin (Cefazolin shortage)--stopped with benign infectious work-up-consulted Podiatry--appreciated recs-CRP 25.8, PCT-benign -trend fever/CBC-Tylenol prnHypercalcemia:-Acute on chronic--resolved-PTH normal (28); work-up including PTH-rP (9), vitamin D testing, TSH, random cortisol unrevealing-Possibly d/t immobility or primary hyperparathyroidism; sent additional PTH testing with mineral metabolism level (send-out test)-iCa++-5.58 (03/11/22)-s/p  IVF 100-- --will dc -Continue FWF via G-tube 400 ml q6H-repeat PTH 33.6, am Cortisol 8.9-trend Ca++ + Alb q3days while IP-Endocrine consult  recs: possible additional therapy pending PTH results. -s/p Zolendronic acid 4 mg X1 (3/21)-fasting CTx (Collagen Type-1 Telopeptide)-elevated Acute encephalopathy:LE weakness:-Unclear etiology, possibly prolonged delirium iso hospitalization from Nov 2022 - currentMRI brain, MRI total spine unremarkable-LP unremarkable-Paraneoplastic and autoimmune encephalopathy panels negative-Overall somewhat improved cognitive status during admission, remains alert and oriented x 1-2-Neurology rec'd EMG to complete work-up of LE weakness but pt declined; Neurology does not recommend any further testing and signed off 02/26/22?Elevated LFTs:-resolved-ABDCT- ?Multiple peritoneal nodules-LFTs normal on admission but new elevation after 3d of admission-Work-up showed elevated IgG, smooth muscle antibodies c/f possible autoimmune hepatitisInitial concern that imaging showed peritoneal nodules ?carcinomatosis-On review of imaging, nodules improving, more likely to be continued resolution of debris from perforation-Liver tests resolved without intervention, so most likely DILI from antibiotics (ceftriaxone and Zosyn)- Check LFTs weekly to ensure she is not evolving autoimmune hepatitis given multiple positive autoimmune markersRecurrent Hypokalemia:-resolved-appears c/w GI losses with watery ileostomy stool--Banatrol added-replaced via G-tube--will add daily dosing-trend UOP/BMP/Cr prn?R lateral foot and L medial eminence deep tissue injuries:-Stable-Xray feet with degenerative changes but no acute findings- Podiatry following - no dressing requirements, WBAT to BLE.No?indication for podiatric surgical intervention at this time.  Patient may follow-up with Dr. Gazes within 3-4 weeks of discharge. - No need for abx at this time Hx Bilateral DVT:- Apixaban 5mg  BID-HELD-c/w Lovenox 80 mg q12H?Elevated LFC ratio:Iron deficiency anemia:-Hematology consult &  signed off - elevated FLC ratio felt to be reactive, plasma cell disorder unlikely ?Chronic conditions:-Rheumatoid Arthritis - has been off of MTX since November/acute illness, rheumatology signing off current ams/weakness not felt to be related to her RA, follow up outpatient -Bilateral LE DVT - ?provoked from recent surgery, cont lovenox, can resume apixaban at dischargeDysphagia:-on TF + FWF-SLP consulted--for re-evluation-aspiration precautionsHospital Issues:- F: none- E: replete PRN- N: Tube feeds- GI: bowel regimen- PPx: Lovenox 80 mg q12H (s/p Eliquis)- Code Status:Full ACLS?Disposition: LTC, family looking into T19 application FEN: diet, K>4, Mg>2 Lines: PIV Tubes: NoneMobility: pendingPT/OT: pending Disposition: pendingMedication Reconciliation: In ProgressCommunication: pendingSigned:Tremar Wickens Vivi Martens, MD, MSc  Communication via [facility] Hospitalist Service3/26/2023, 1:49 PMTime > 25 Minutes : time spent obtaining history from patient or surrogate, review of medical records/prior chart documentation , examining/evaluating patient, ordering or performing treatments/interventions; ordering/reviewing laboratory studies, radiographic studies, pulse oximetry; re-evaluation of patient's condition and development of treatment plan, discussions with consultants, evaluation of patient's response to treatment. With 50% of time spent in care follow-up/care coordination, plan of care discussion with consulting specialities, and with the patient and/or patient's family/HCPOA/Surrogate. Code Status Full Code DVT Prophylaxis Lovenox ADMIT MED REC In Progress PCP Pcp, Does Not Have A None Emergency contact Extended Emergency Contact InformationPrimary Emergency Contact: McCulley,ReneeHome Phone: [phone] Disposition TBD

## 2022-03-16 NOTE — Plan of Care
Problem: Adult Inpatient Plan of CareGoal: Plan of Care ReviewFlowsheets (Taken 03/15/2022 0341 by Su Grand, RN)Plan of Care Reviewed With: patient Plan of Care Overview/ Patient Status    0700-1900VSS on RA except tachy in 90s. A&Ox1-2, disoriented to time, place, and situation. No c/o pain. Pt has PEG tube requiring bolus feeds of jevity 1.5 367cc. Free Water Flushes of 400cc q6h were given at 1400. All medication given through PEG per MAR. Pt has ileostomy 2x. Left ileostomy [place] to no output, (tan) drainage. Left ileostomy bag changed. Right ostomy collecting stool, output loose. Purewick in place for bladder incontinence. Dressings changed per orders. T&R q2h. Fall prevention program inplace, bed in lowest position, call bell within reach, bed alarm on, will continue to fdollow POC. Hourly rounding completed. Safety maintained.

## 2022-03-16 NOTE — Plan of Care
Plan of Care Overview/ Patient Status    1900-0700: A&Ox1-2, disoriented to time, place and situation. No complaint of pain or discomfort. PEG tube in place, bolus feed administered @0900  pm. Free water flush given per order, q6hr. All medications given through the PEG tube. Pt has illeostomy x2. Left no output. Right side documented in flowsheet. Purewick in place, changed this shift. Incontinent, care provided. Safety maintained. Call bell in reach. Bed alarm on. Hourly rounding completed.

## 2022-03-17 MED ORDER — FERROUS SULFATE 300 MG (60 MG IRON)/5 ML ORAL LIQUID
603005 mg (300 mg Ferrous sulfate) / 5 mL | Freq: Two times a day (BID) | ORAL | Status: DC
Start: 2022-03-17 — End: 2022-03-25
  Administered 2022-03-18 – 2022-03-24 (×14): 60 mL via ORAL

## 2022-03-17 MED ORDER — THIAMINE HCL (VITAMIN B1) 100 MG TABLET
100 mg | Freq: Two times a day (BID) | ORAL | Status: DC
Start: 2022-03-17 — End: 2022-03-25
  Administered 2022-03-18 – 2022-03-24 (×14): 100 mg via ORAL

## 2022-03-17 MED ORDER — ACETAMINOPHEN 650 MG/20.3 ML ORAL SUSPENSION
65020.3 mg/20.3 mL | Freq: Four times a day (QID) | ORAL | Status: DC | PRN
Start: 2022-03-17 — End: 2022-03-25
  Administered 2022-03-18 – 2022-03-23 (×6): 650 mL via ORAL

## 2022-03-17 MED ORDER — BANANA FLAKES-TRANSGALACTOOLIGOSACCHARIDE ORAL POWDER PACKET
Freq: Two times a day (BID) | ORAL | Status: DC
Start: 2022-03-17 — End: 2022-03-25
  Administered 2022-03-19 – 2022-03-24 (×10): via ORAL

## 2022-03-17 MED ORDER — PYRIDOXINE (VITAMIN B6) 50 MG TABLET
50 mg | Freq: Every day | ORAL | Status: DC
Start: 2022-03-17 — End: 2022-03-25
  Administered 2022-03-18 – 2022-03-24 (×7): 50 mg via ORAL

## 2022-03-17 MED ORDER — FOLIC ACID 1 MG TABLET
1 mg | Freq: Every day | ORAL | Status: DC
Start: 2022-03-17 — End: 2022-03-25
  Administered 2022-03-18 – 2022-03-24 (×7): 1 mg via ORAL

## 2022-03-17 MED ORDER — FAMOTIDINE 40 MG/5 ML (8 MG/ML) ORAL SUSPENSION
8 mg/ml | Freq: Every day | ORAL | Status: DC
Start: 2022-03-17 — End: 2022-03-25
  Administered 2022-03-18 – 2022-03-24 (×7): 8 mL via ORAL

## 2022-03-17 MED ORDER — NIACIN IMMEDIATE RELEASE 100 MG TABLET
100 mg | Freq: Every evening | ORAL | Status: DC
Start: 2022-03-17 — End: 2022-03-25
  Administered 2022-03-18 – 2022-03-24 (×7): 100 mg via ORAL

## 2022-03-17 NOTE — Consults
[facility] Interventional RadiologyConsultation Information Interventional Radiology consultation requested by: Danielle [name], MDReason for consultation: dislodged G tubeSource of information: Patient, medical record, and consulting providerHistory of Present Illness Danielle [name] is a 66 y.o. 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 G tube was noted to be dislodged today. Interventional Radiology was requested to evaluate for G tube replacement. Past Medical History No past medical history on file. Past Surgical History No past surgical history on file. Family History No family history on file. Social History Social History Tobacco Use ? Smoking status: Not on file ? Smokeless tobacco: Not on file Substance Use Topics ? Alcohol use: Not on file  She has no history on file for drug use.  Inpatient Medications Outpatient Medications Current Facility-Administered Medications Medication Dose Route Frequency Provider Last Rate Last Admin ? [Held by provider] apixaban (ELIQUIS) tablet 5 mg  5 mg Per G Tube Q12H Littie Deeds, MD     ? Jerrel Ivory Plus oral powder packet  1 packet Per G Tube BID Manzon, Berkley Harvey, MD   1 packet at 03/17/22 1110 ? chlorhexidine gluconate (PERIDEX) 0.12 % solution 15 mL  15 mL Mouth/Throat BID Dorisann Frames, MD   15 mL at 03/17/22 1110 ? Elemental iron 60 mg (300 mg Ferrous sulfate) / 5 mL oral liquid 45 mg  45 mg Per G Tube BID Dorisann Frames, MD   45 mg at 03/17/22 1111 ? enoxaparin (LOVENOX) injection 80 mg  80 mg Subcutaneous Q12H Chaar, Abdelkader, MD   80 mg at 03/17/22 1110 ? famotidine (PEPCID) suspension (8 mg/mL) 20 mg  20 mg Per G Tube Daily Dorisann Frames, MD   20 mg at 03/17/22 1110 ? folic acid (FOLVITE) tablet 1 mg  1 mg Per G Tube Daily Dorisann Frames, MD   1 mg at 03/17/22 1111 ? niacin Immediate Release tablet 100 mg  100 mg Per G Tube Nightly Dorisann Frames, MD   100 mg at 03/16/22 2052 ? pyridoxine (vitamin B6) (B-6) tablet 100 mg  100 mg Per G Tube Daily Dorisann Frames, MD   100 mg at 03/17/22 1111 ? [Held by provider] rosuvastatin (CRESTOR) tablet 10 mg  10 mg Per G Tube Nightly Dorisann Frames, MD   10 mg at 02/14/22 2245 ? sodium chloride 0.9 % flush 3 mL  3 mL IV Push Q8H Dorisann Frames, MD   3 mL at 03/17/22 1437 ? thiamine (VITAMIN B1) tablet 100 mg  100 mg Per G Tube BID Dorisann Frames, MD   100 mg at 03/17/22 1111 acetaminophen, dextrose (GLUCOSE) oral liquid 15 g **OR** fruit juice **OR** skim milk, dextrose (GLUCOSE) oral liquid 30 g **OR** fruit juice, dextrose injection, dextrose injection, glucagon, ondansetron **OR** ondansetron (ZOFRAN) IV Push, sodium chloride Medications Prior to Admission Medication Sig Dispense Refill Last Dose ? acetaminophen (TYLENOL) 160 mg/5 mL elixir 500 mg by Per G Tube route every 6 (six) hours as needed.    ? acetaminophen (TYLENOL) 500 mg tablet Give 2 tablets via PEG-tube every 6 hours as needed    ? apixaban (ELIQUIS) 5 mg tablet 1 tablet (5 mg total) by Per G Tube route every 12 (twelve) hours.    ? chlorhexidine gluconate (PERIDEX) 0.12 % solution Use as  directed 15 mLs in the mouth or throat 2 (two) times daily.    ? Elemental iron 60 mg (300 mg Ferrous sulfate) / 5 mL oral liquid Give 3.7 mL by g-tube two times daily    ? folic acid (FOLVITE) 1 mg tablet 1 tablet (1 mg total) by Per G Tube route daily.    ? insulin lispro (ADMELOG, HUMALOG) 100 unit/mL vial Inject under the skin 3 (three) times daily before meals. Use per sliding scale.    ? niacin 50 mg Immediate Release tablet 2 tablets (100 mg total) by Per G Tube route at bedtime.    ? pantoprazole (PROTONIX) 40 mg GrPS oral suspension 1 packet (40 mg total) by Per G Tube route daily.    ? psyllium (METAMUCIL) packet Give 1 packet via g-tube two times daily    ? pyridoxine, vitamin B6, (VITAMIN B-6) 100 mg tablet 1 tablet (100 mg total) by Per G Tube route daily.    ? rosuvastatin (CRESTOR) 10 mg tablet 1 tablet (10 mg total) by Per G Tube route nightly.    ? therapeutic multivitamin liquid 15 mLs by Per G Tube route daily.    ? thiamine (VITAMIN B1) 100 mg tablet 1 tablet (100 mg total) by Per G Tube route 2 (two) times daily.     Allergies No Known Allergies Subjective Data ROSObjective Data Physical ExamVitals:  03/16/22 2336 03/17/22 0510 03/17/22 0812 03/17/22 1517 BP: 113/76 121/73 124/85 109/72 Pulse: (!) 101 (!) 97 89 86 Resp: 16 16 18 18  Temp: 98.1 ?F (36.7 ?C) 98.4 ?F (36.9 ?C) 98.7 ?F (37.1 ?C) 99.4 ?F (37.4 ?C) TempSrc: Axillary Axillary Axillary Axillary SpO2: 100% 98% 97% 100% Weight:     Height:     Neuro: Alert, ConfusedAirway: Unable to assessHeart: Warm, perfusedLungs: No respiratory distressAbdomen: Non-distended and Non-tender; ostomy, open G tube siteLaboratory ResultsChemistry:Recent Labs Lab 03/23/230513 03/24/230551 03/25/230517 NA 138 133* 134* K 4.0 3.9 4.0 CL 107 103 103 CO2 23 21 22  BUN 12 10 10  CREATININE 0.27* 0.27* 0.28* Complete Blood Count:Recent Labs Lab 03/21/230500 03/24/230551 03/25/230517 WBC 5.8 7.8 6.8 HGB 10.0* 9.7* 10.3* HCT 33.60* 31.00* 33.70* PLT 297 332 340 Liver Function Tests:Recent Labs Lab 03/25/230517 AST 26 ALT 17 ALKPHOS 84 BILITOT 0.2 Coagulation Studies:No results for input(s): PTT, LABPROT, INR in the last 168 hours.Microbiology:No results for input(s): LABBLOO, LABURIN, LOWERRESPIRA in the last 168 hours.Assessment and Plan AssessmentCrystal [name] is a 66 y.o. female with recent prolonged hospitalization for perforated diverticulitis, G tube placement at OSH (01/24/22), admitted for LTAC placement, ongoing encephalopathy. G tube was noted to be completely dislodged today. Interventional Radiology was requested to evaluate for G tube replacement.G tube was completely dislodged with balloon still inflated. An 32fr foley was placed in the tract. Of note, she had a SLP eval today, which recommended puree, thin liquids, crushed meds. No recommendation for long term enteral access for feeding. After comprehensive evaluation of the patient and discussion with the requesting service, Interventional Radiology recommends:-calorie count over the next 3 days-if pt is able to meet calorie needs with PO intake, no need to replace G tube-if pt fails calorie count, will plan to replace G tube at the end of this weekConsult is for:  G Tube ReplacementIndication: Nutrition Type of tube requested : G TUBEPlacement technique: PushIs there a NG/NJ tube in place: No - has existing tract.NG Suction on 48-72 hours: NoHistory of GI Surgery/Procedures: s/p hemicolectomy, G tube placementIs anesthesia needed: NoAnticoagulants: YesCoagulation Studies:No results for input(s):  PTT, LABPROT, INR in the last 168 hours.PLAN: -Keep foley in tract-f/u calorie count-Tentatively scheduled end of the week for replacementCOVID-19: Negative on admission, no retestSedation: Local anesthesia with possible minimal sedation.Diet: Diet may be continued.Consent: The procedure was discussed with the surrogate decision maker and informed written consent obtained.Interpreter Services: NoneCoagulation:   Procedure risk: Low.   Blood thinners: No change in medication required.   Labs: Low risk procedure - No lab tests required.Contrast:   Allergy: No known allergy to iodinated contrast.   Renal function: Renal function is adequate for administration of iodinated contrast.Code Status: Full code.*Please note that procedure times as indicated in Epic are placeholders and do not necessarily reflect when the procedure will be performed. Thank you for involving Interventional Radiology in the care of your patient. Please text or call my Mobile [facility] with any questions or concerns.With urgent questions or concerns, please contact IR ZO:[id number] (pagers)- YSC: [phone]- SRC: [phone]- BH: [phone] (phone, all locations)- [phone] [place], APRN 03/17/2022

## 2022-03-17 NOTE — Plan of Care
Plan of Care Overview/ Patient Status    1900-0700A&Ox1, Disoriented to time, place and situation. No complaints of pain and SOB. Pt on bolus TF of Jevity 1.5. Pt free water flushes maintained this shift. Pt having no residual this shift. Pt has an ileostomy and enterostomy both C/D/I. Pt dressings changed per MAR. Barrier cream applied to Pts bottom. Hourly rounding completed, safety maintained, will continue to follow POC.

## 2022-03-17 NOTE — Progress Notes
[facility] Medicine Progress NoteAttending Provider: Henriette Combs, MDLOS: 34CC: encephalopathy                                                                          Subjective: Interim History: Pt had SLP evaluation todayPt was calm but confusedDenied any pain at this timeRN subsequently informed me that pt pulled out PEG tube by accidentReview of Allergies/Meds/Hx: Review of Allergies/Meds/Hx:I have reviewed the patient's: allergies, current scheduled medications, current infusions, and current prn medicationsScheduled Meds:[Held by provider] apixaban, 5 mg, Q12Hbanana flakes-t-galactooligos., 1 packet, BIDchlorhexidine gluconate, 15 mL, BIDElemental iron, 45 mg, BIDenoxaparin (LOVENOX) treatment, 80 mg, Q12Hfamotidine, 20 mg, Dailyfolic acid, 1 mg, Dailyniacin, 100 mg, Nightlypyridoxine (vitamin B6), 100 mg, Daily[Held by provider] rosuvastatin, 10 mg, Nightlysodium chloride, 3 mL, Q8Hthiamine, 100 mg, BIDContinuous Infusions:PRN Meds:.acetaminophen, dextrose (GLUCOSE) oral liquid 15 g **OR** fruit juice **OR** skim milk, dextrose (GLUCOSE) oral liquid 30 g **OR** fruit juice, dextrose injection, dextrose injection, glucagon, ondansetron **OR** ondansetron (ZOFRAN) IV Push, sodium chlorideObjective: Vitals:I have reviewed the patient's current vital signs as documented in the patient's EMR.   and Last 24 hours: Temp:  [98.1 ?F (36.7 ?C)-99.5 ?F (37.5 ?C)] 98.7 ?F (37.1 ?C)Pulse:  [89-101] 89Resp:  [16-18] 18BP: (100-124)/(69-85) 124/85SpO2:  [96 %-100 %] 97 %I/O's:I have reviewed the patient's current I&O's as documented in the EMR.Gross Totals (Last 24 hours) at 03/17/2022 1514Last data filed at 03/16/2022 2100Intake 1112 ml Output 250 ml Net 862 ml Procedures:None Physical Exam General: no acute distress, awake, alert & oriented x1Head: atraumatic, normocephalicEyes: EOMI, PERRLA, sclerae anictericNeck: supple, no LADCV: S1, S2, RRR, no R/G/MResp: CTA B/L; no w/r/r/, normal effort, symmetric air entryAbd: bs +, soft, NT/ND, no rebound tenderness or guarding; LLQ ostomy site c/d/i; PEG tube in situExtrem: no pedal edema, w/w/p, no c/c/eSkin: warm, dry, no obvious rashes or lesionsNeuro: no focal deficits, awake and alert, no extra movements notedPsych: normal mood and affectLabs:I have reviewed the patient's labs within the last 24 hrs. Significant findings are see below.Last 24 hours: No results found for this or any previous visit (from the past 24 hour(s)).Diagnostics:I have reviewed the patient's Radiology report(s) within the last 48 hrs. Significant findings are see below.XR Chest PA or AP (Portable) Final Result    No consolidations to suggest pneumonia.  Reported And Signed By: Leilani Merl, MD    [facility] Radiology and Biomedical Imaging   XR Foot Bilateral AP Lateral and Oblique Final Result  1. Diffuse degenerative changes without evidence of acute osseous injury. There is no evidence of bony erosion.  Reported And Signed By: Merdis Delay, MD    [facility] Radiology and Biomedical Imaging   MRI Brain w wo IV Contrast Final Result  1. 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 2. Redemonstration microvascular disease no new new territorial restricted diffusion hemorrhage or midline shift. 3.  Motion limits assessment of the heterogeneous maxillary alveolus correlate with dental inspection Report Initiated By:  Neta Mends, MD  Reported And Signed By: Lynett Fish, MD    [facility] Radiology and Biomedical Imaging   MRI Total Spine w wo IV Contrast Final Result  1. Scoliotic deformity of  the thoracolumbar spine. 2. 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 3. No obvious epidural abscess or paraspinal collection. No findings suspicious for discitis/osteomyelitis. Mild perifacet inflammatory changes identified at L4-5, likely degenerative. Mild increased T2 signal and enhancement within the posterior paraspinal muscles diffusely throughout the lumbar spine could be related to denervation or inflammation.  Examination is significantly degraded by patient motion and sagittal T2 signal changes within the spinal cord or abnormal intrathecal enhancement may not be apparent on this examination. Examination to be repeated with sedation if clinically indicated.  Report Initiated By:  Neta Mends, MD  Reported And Signed By: Aviva Kluver, MD    [facility] Radiology and Biomedical Imaging   XR Hips Bilateral AP and Lateral with AP Pelvis Final Result  US Abdomen Pelvis Vascular Study Complete ([facility]) Final Result    No evidence of portal vein or hepatic vein thrombosis.  Increased liver echogenicity, which is most commonly noted with hepatic steatosis. However, please note that more advanced forms of liver disease, including hepatic fibrosis/early cirrhosis may have a similar imaging appearance.   [place] Rad[facility] Notify System Classification: Routine  Report Initiated By:  Kreg Shropshire, MD  Reported And Signed By: Verl Bangs, MD    [facility] Radiology and Biomedical Imaging   Hospitalist Procedure [facility]) Final Result  Hospitalist Procedure [facility]) Final Result  [place] Head wo IV Contrast Final Result Since the prior exam, there has been a significant increase in ventricular size and sylvian fissures. In the appropriate clinical setting normal pressure hydrocephalus can be considered.  Report Initiated By:  Consuela Mimes, MD  Reported And Signed By: Cherre Blanc, MD    [facility] Radiology and Biomedical Imaging   [place] Chest wo IV Contrast Final Result  Patchy groundglass opacities in bilateral lower lobes with 4 mm right lower lobe lung nodule, findings may represent infectious/inflammatory etiology in a proper clinical setting. Short-term follow-up can be obtained for resolution. Otherwise consider follow-up chest [place] in 12 months for stability.  Reported And Signed By: Antonieta Loveless, MD    [facility] Radiology and Biomedical Imaging   [place] Abdomen Pelvis w IV Contrast Final Result No collection in abdomen or pelvis.   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 Correlation with prior imaging is recommended.    Reported And Signed By: Madie Reno, MD    [facility] Radiology and Biomedical Imaging   CXR (portable) Final Result   No acute cardiothoracic abnormality.  Report Initiated By:  Tessie Eke, RRA  Reported And Signed By: Alberteen Sam, MD    [facility] Radiology and Biomedical Imaging   [place] RESULT SCAN Final Result  [place] RESULT SCAN Final Result  Outside Interpretation [place] Abdomen and/or Pelvis Final Result  [place] RESULT SCAN Final Result  XRAY RESULT SCAN Final Result  Outside Interpretation MRI Neuro Final Result   Only motion degraded T1 images are available for review which significantly limits evaluation. Mild ventriculomegaly. No focal mass or intracranial hemorrhage is identified.    Report Initiated By:  Renato Gails, MD  Reported And Signed By: Vicki Mallet, MD    [facility] Radiology and Biomedical Imaging   MRI RESULT SCAN Final Result  Outside Interpretation MRI Neuro Final Result Multilevel degenerative changes as described above. Report Initiated By:  Renato Gails, MD  Reported And Signed By: Vicki Mallet, MD    [facility] Radiology and Biomedical Imaging   Outside Interpretation MRI Neuro Final Result   Apparent multiple foci of T2  hyperintensity in the thoracic cord throughout its length including at the level of the conus in the setting of motion artifact. It is unclear if these are artifactual or relate to lesions within the cord. Repeat T2 axial and T2 sagittal images of the thoracic spine are recommended.   Levoscoliotic curvature of the thoracic spine with no significant spinal canal stenosis or neural foraminal narrowing.   Report Initiated By:  Renato Gails, MD  Reported And Signed By: Vicki Mallet, MD    [facility] Radiology and Biomedical Imaging   MRI RESULT SCAN Final Result  [facility] MRI Cervical Spine Final Result  Outside Interpretation [place] Abdomen and/or Pelvis Final Result  [place] RESULT SCAN Final Result  Outside Interpretation [facility] Abdomen and/or Pelvis Final Result  [place] RESULT SCAN Final Result  ED Procedural [place] Peripheral Venous Access    (Results Pending) ECG/Tele Events: I have reviewed the patient's ECG as resulted in the EMR.Assessment: A 66 Y O F PMHx of RA, recent prolonged hospitalization for perforated diverticulitis (s/p ex Lap/R hemicolectomy with end ileostomy). Admitted 2/24 for LTACH placement and persistent encephalopathy. Found to be febrile without clear infectious source found, fevers resolved. Extensive work-up of encephalopathy and leg weakness unremarkable. Course c/b elevated LFTs which resolved, likely 2/2 DILI from Ceftriaxone or Zosyn. Stable and awaiting LTC placement, but with ongoing significant hypercalcemia.?Mental status appears overall stable/improving.Plan: # PEG tube dislodgement - accidentally dislodged- IR to evaluate- May need replacement- TF held for now# Dysphagia -- On TF + FWF- SLP consulted--for re-evaluation - 3/27 puree and thin liquids- Re-eval for diet upgrade later- Calorie count- Requires 1: 1 feeding assistance- Aspiration precautions# Recurrent Fever/R-foot Cellulitis/Eschar - transient fever resolved, no leukocytosis. No clear source identified, possibly 2/2 DVT vs autoimmune. CXR neg, UA neg. CRP 25.8, procal neg. Xray feet with degenerative changes but no acute findings - Suspected source was R foot cellulitis/eschar- S/P CTX 2/21 and Zosyn 2/21-2/25; S/P Empiric ABX-Oxacillin --stopped with benign infectious work-up- Consulted Podiatry--appreciate recs- do not think that foot wound is the cause of the fevers- WBAT to BLE.No?indication for podiatric surgical intervention at this time. ?- Patient may follow-up with Dr. Gazes within 3-4 weeks of discharge. - Trend fever/CBC- Tylenol prn# Acute encephalopathy - # LE weakness -- Unclear etiology, possibly prolonged delirium iso hospitalization from Nov 2022 - currentMRI brain, MRI total spine unremarkable- LP unremarkable- Paraneoplastic and autoimmune encephalopathy panels negative- Overall somewhat improved cognitive status during admission, remains alert and oriented x 1-2- Neurology rec'd EMG to complete work-up of LE weakness but pt declined; Neurology does not recommend any further testing and signed off 3/8/23RESOLVED:# Hypercalcemia - acute on?chronic--resolvedPTH normal (28); work-up including PTH-rP (9), vitamin D testing, TSH, random cortisol unrevealing. Repeat PTH 33.6, am Cortisol 8.9. Fasting CTx (Collagen Type-1 Telopeptide)-elevated- Possibly d/t immobility or primary hyperparathyroidism; sent additional PTH testing with mineral metabolism level (send-out test)- Endocrine consult  recs: possible additional therapy pending PTH results. - S/P IVF 100-- --will dc- Continue FWF via G-tube 400 ml q6H- S/P Zolendronic acid 4 mg X1 (3/21)- Trend Ca and Albumin q3d while IP?# Elevated LFTs -- Resolved- [place] A/P w/ multiple peritoneal nodules- LFTs normal on admission but new elevation after 3d of admission- Work-up showed elevated IgG, smooth muscle antibodies c/f possible autoimmune hepatitisInitial concern that imaging showed peritoneal nodules ?carcinomatosis- On review of imaging, nodules improving, more likely to be continued resolution of debris from perforation- Liver tests resolved without intervention, so most likely DILI from antibiotics (ceftriaxone and Zosyn)- Check LFTs weekly  to ensure she is not evolving autoimmune hepatitis given multiple positive autoimmune markers?# Recurrent Hypokalemia - - Appears c/w GI losses with watery ileostomy stool--Banatrol added- Replaced via G-tube--will add daily dosing- Trend UOP/BMP/Cr prn?CHRONIC:# Hx Bilateral DVT:- Apixaban 5mg  BID-HELD- Continue Lovenox 80 mg q12H?# Iron deficiency anemia - chronic, hgb now stable# Elevated FLC ratio - -Hematology consulted & signed off - elevated FLC ratio felt to be reactive, plasma cell disorder unlikely ?# Rheumatoid Arthritis - has been off of MTX since November/acute illness, rheumatology signing off current ams/weakness not felt to be related to her RA, follow up outpatient -Bilateral LE DVT - ?provoked from recent surgery, cont lovenox, can resume apixaban at discharge?FEN: puree and thin liquidsDVT ppx: lovenoxCODE: FULLDispo: pending sister applying for conservatorship; T19 pendingMedical decision making for this patient was moderate.I spent 35 minutes today on this encounter before, during and after the visit, reviewing labs and records, evaluating and examining the patient, entering orders and documenting the visit. Electronically Signed:Emalyn Schou Fortunato Curling, MD, PhDDepartment of Internal MedicineYale Hospitalist ServicesAvailable on MHB3/27/20233:14 PM

## 2022-03-17 NOTE — Progress Notes
[facility]	Podiatry Progress NoteDate: 3/25/2023Patient Name: Shahida Schnackenberg: [name] Attending Provider: Thurnell Lose, MDCC: Bilateral foot woundsSubjective: Pt seen at bedside this a.m. with attending in [facility].  Nursing staff at bedside. Primary team requested on 03/14/22 for re-evaluation of patient's wound for possible source of her low-grade fever. Patient in good spirits this during this encounter.PMH: PSH: No past medical history on file. No past surgical history on file. Social History: Family History: Social History Tobacco Use ? Smoking status: Not on file ? Smokeless tobacco: Not on file Substance Use Topics ? Alcohol use: Not on file  No family history on file. Allergies: Patient has no known allergies.Review of Systems: Unable to assess 2/2 AMSVitals: Vitals:  03/15/22 0823 BP: 130/74 Pulse: (!) 94 Resp: 16 Temp: 97.5 ?F (36.4 ?C) Physical Exam: Gen: NADHead: NormocephalicPsych: AAO x 2Chest: CTAB, no r/r/wCardio: RRR, S1, S2, no m/r/gAbd: Soft NT, ND, +bsVascular: DP/PT pulses palpable. Normal CRT to all digits. Foot is warm to touch. Pedal hair growth is absentNeuro: Unable to assess MSK: Unable to ROM digits when asked, unable to participate in MMTDerm: Deep tissue injury noted to the Right lateral foot without any open lesions. No drainage, purulence, tunneling, crepitus, fluctuance, malodor, proximal lymphagitic streaking, or other acute clinical signs of infection. DTI shape size and look very similar to prior physical examLabs: Lab Results Component Value Date  WBC 6.8 03/15/2022  HGB 10.3 (L) 03/15/2022  HCT 33.70 (L) 03/15/2022  CREATININE 0.28 (L) 03/15/2022  BUN 10 03/15/2022  CO2 22 03/15/2022  PLT 340 03/15/2022  INR 1.10 02/18/2022  GLU 110 (H) 03/15/2022  K 4.0 03/15/2022  NA 134 (L) 03/15/2022 CL 103 03/15/2022  ALT 17 03/15/2022  AST 26 03/15/2022  TSH 1.670 02/17/2022  HSCRP 25.8 (H) 03/14/2022  SEDRATE 98 (H) 02/19/2022 Imaging: XR Foot Bilateral AP Lateral and ObliqueResult Date: 3/7/2023AP, LATERAL AND OBLIQUE BILATERAL FOOT X-RAY Clinical Information: Baseline assessment. Assess for erosions. Comparison: None Findings: Left foot: The overall bony mineralization appears qualitatively diminished. There is evidence of joint space narrowing indicative of arthropathy involving the interphalangeal joint and metatarsal phalangeal joint of the first digit as well as the DIP and PIP joints of the remaining digits. There is mild hallux valgus as well as bunion formation. The moderate degenerative changes are noted in the midfoot. There is no acute osseous injury. Right foot: The overall bony mineralization appears qualitatively diminished. There is evidence of joint space narrowing indicative of arthropathy involving the interphalangeal joint and metatarsal phalangeal joint of the first digit as well as the DIP and PIP joints of the remaining digits. There is moderate to severe hallux valgus as well as bunion formation. The moderate degenerative changes are noted in the midfoot. There is no acute osseous injury. 1. Diffuse degenerative changes without evidence of acute osseous injury. There is no evidence of bony erosion. Reported And Signed By: Merdis Delay, MD  [facility] Micro/Path: None Assessment/Plan: Ima Hafner is a 66 y.o. female with Right lateral foot and and resolved Left medial eminence deep tissue injuries, stable. No open wounds or SOI. ?- Revaluated patient's wound with attending today.  Lateral eschar is very stable at this time.  No open wounds around the deep tissue injury, no crepitus fluctuance or drainage noted.  No other clinical signs of infection noted. Mild periwound erythema most likely due to pressure rather than infection. - At this time the foot is not the cause of the patient's low-grade fever.- Would recommend broadening  differential to urinary or GI causes for low-grade fever.- No dressing needed on the left medial eminence- Appreciate nursing continuing  With a Betadine with overlying DSD to the right DTI- Recommend WBAT to BLE-Will defer antibiotics per primary team - none indicated per podiatry standpointDispo: No indication for podiatric surgical intervention at this time.  Patient may follow-up with Dr. Gazes within 3-4 weeks of discharge.  Podiatry will continue to follow peripherally while in house. Please contact the resident on call with any acute changes, new issues, or questions.Seen with attending Dr. Arlana Pouch covering for Dr. Darral Dash page on-call podiatry resident with questions:?	SEE AMION FOR CALL SCHEDULE Electronically Signed by Donne Hazel, DPM, March 25, 2023I saw and evaluated [name] with the resident. I agree with the findings and the plan of care as documented in the resident note.Electronically Signed by Paulene Floor, DPM, 03/15/2022

## 2022-03-17 NOTE — Plan of Care
Adult Speech and Language PathologyFEES Evaluation3/27/2023Patient Name:  Danielle MaloneMR#:  [id number] Date of Birth:  [birth year]Therapist:  Rachel Moulds, SLP SLP IP Adult General Information - 03/17/22 1050    General Information  Subjective I need to find my house coat   Medical Diagnosis Per EMR  66 y.o. 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 Admitted 2/24 for LTAC placement and persistent encephalopathy. Found to be febrile without clear infectious source found, fevers resolved. Extensive work-up of encephalopathy and leg weakness unremarkable. Course c/b elevated LFTs which resolved, likely 2/2 DILI from ceftriaxone or Zosyn. Stable and awaiting LTC placement, but with ongoing significant hypercalcemia. Mental status appears overall stable/improving.   General Observations Pt met awake at bedside on room air.   Pertinent History of Current Problem Pt seen for two attempted swallow studies this admission (2/24 for FEES and 2/27 for MBS) pt refusing trials of PO for both studies. She is currently NPO with a PEG placement.   Primary Concern Swallow function   Prior Level of Functioning Pt with PEG from OSH, however, no documented hx of swallow evaluation/dysphagia found in EMR prior to current admission.     SLP IP Adult Pain/Comfort - 03/17/22 1050    Pain/Comfort  Pain Comment (Pre/Post Treatment Pain) No c/o pain      SLP IP Adult Oral/Motor Assessment - 03/17/22 1050    Oral/Motor Function  Labial Function WFL - Within Functional Limits   Lingual Function WFL - Within Functional Limits   Facial  WFL - Within Functional Limits   Dentition Sparse dentition   Overall Oral Motor Function Comments OME WFL      SLP IP Adult 3 Ounce Water Challenge - 03/17/22 1050    Three Ounce Water Swallow Challenge Reason for Study concern for aspiration risk   Patient's current diet NPO   PEG  Water Swallow Challenge was Performed Patient failed the challenge   Continue Patient as NPO yes, until objective swallow evaluation completed   Aspiration Precautions yes   Speech and Language Pathology follow up for: Instrumental swallow evaluation   Overall Three Ounce Water Swallow Challenge Comments Pt failed the 3oz water swallow screen as evidenced by her inability to drink 3oz of water continuously. SLP to proceed with formal swallow evaluation please see results below.      SLP Adult FEES - 03/17/22 1050    Fiberoptic Endoscopic Evaluation of Swallowing  Type of Study Repeat FEES   Reason for Study concern for aspiration risk;failed [place] Swallow Protocol   Patients Current Diet NPO   PEG  FEES was performed At bedside;With patient sitting upright   Scope was inserted in the nares Right   Serial number of the Scope used [id number]   Pooled secretions noted None   True Vocal Cords Mobile bilaterally   Thin Liquid yes        Delivery Straw        Volume continuous        Oral Phase Adequate        Pharyngeal Phase Trace/mild pharyngeal residue        Laryngeal Penetration None        Aspiration None        Penetration-Aspiration Scale 1 - Material does not enter the airway        Other Poor scope tolerance/ view at times limited study. No visible airway compromise across 3 separate consecutive trials of thin liquids.  Nectar Thick Liquid yes        Delivery Straw        Volume small single sip        Oral Phase Adequate        Other Poor scope tolerance/ view at times limited study. No visible airway compromise.  Puree yes        Delivery Spoon        Volume 1/2 teaspoon;full teaspoon        Oral Phase Adequate        Other Poor scope tolerance/ view at times limited study. No visible airway compromise across across two trials.  Regular Solid Given absence of pharyngeal residue with puree or airway compromise, solid attempted once scope was removed but patient refused.  Diagnosis Mild pharyngeal dysphagia   Diagnosis secondary to: Decreased pharyngeal contraction   Patient should be NPO No   Diet Recommendations Puree;Thin liquids   Medication Administration crushed;with puree   Recommendations Eat slowly;Sit upright with eating and drinking;Small sips / bites   Patients feeding ability Patient needs assistance   Aspiration Precautions yes        Precautions include: Head of bed 90 degrees/ Chair;Small bolus sizes;Slow feeding;Remain upright;Oral care 3-4 times/daily with a toothbrush   Discontinue PO if patient experiences: Increased temperature;Increased coughing;Increased fatigue   Other (Comments) IMPRESSIONS:Of note today's study was somewhat limited due to poor tolerance of scope, however multiple clear images of pharynx and larynx were eventually obtained.Pt presents with at least a mild pharyngeal dysphagia iso AMS and deconditioning. Pharyngeal phase notable for decreased pharyngeal contraction. Deficits resulted in minimal amounts of pharyngeal residue with  thin liquids in continuous sips. No visible laryngeal penetration or aspiration appreciated. The patient refused a regular solid trial with and without scope in place.     SLP IP Adult Prognosis - 03/17/22 1133    Speech Therapy Prognosis  Services Skilled SLP services to address the above deficits      SLP IP Adult Recommendations - 03/17/22 1133    Recommendations  SLP Therapy Frequency 1x per week   Recommendations discussed with: nurse;team;patient      SLP IP Adult Inpatient Recommendations - 03/17/22 1133    SLP Recommendations for Inpatient Admission  Swallow Recommendations REC: 1. Puree with thin liquids. 2. Meds crushed in puree 3. Aspiration precautions 4. Oral care 2-3x a day 5. Re-consult for solids upgrade (can be done clinically at bedside) or if acute concerns arise.      SLP IP Adult Discharge Summary - 03/17/22 1133    Discharge Summary  Disposition Recommendation(s) To be determined pending progress     Problem: Speech Language Pathology GoalsGoal: Dysphagia Goals, SLPDescription: Pt will tolerate pureed diet with thin liquids w/o s/x of aspiration or adverse impact on respiratory status.Pt will participate in clinical upgrade of solids w/o s/sx of aspiration or adverse impact on respiratory status. Outcome: Interventions implemented as appropriate Rachel Moulds, M.S.,CF-SLPMHB: [phone] 03/17/2022

## 2022-03-17 NOTE — Care Coordination-Inpatient
Follow up call placed to patients sister Jacquenette Shone ph#[phone] the mail box was full at the time of my call this writer was un able to leave a MSG.Follow up call placed to patients brother [name], ph#[phone] a MSG was left requesting a call back to further discuss were they are in the process of securing a conservator. We are currently awaiting a return call.Care management will continue to follow along with the patient's medical team as needed.Troy JonesTransition CoordinatorCare Management ([facility])Ph#[phone]#[phone]#[phone]

## 2022-03-17 NOTE — Other
-  CONSULT  REQUEST  DOC[facility]ATION-[facility] NOTE-Type of consult: [facility] Interventional Radiology -New Consult: [name] Danielle [name] /Location: 9822/9822-A / *Brief Clinical Question: 66 YO F with PEG tube has been pulled out - needs replacement/Callback Cell Phone: [phone]** / Please confirm receipt of this message by texting back ?OK?-2 Glena [place] Page sent to Interventional Radiology [facility] [phone] at 3:19 PM.-Lyliana Dicenso Hudd3/27/20233:19 [facility] [phone]

## 2022-03-18 NOTE — Plan of Care
Plan of Care Overview/ Patient Status    Alert to self. No acute events overnight. Tolerating thin liquids, enjoys cool juices. Pills crushed in pudding no s/s of aspiration. Loose, green stool to ileostomy bag. Incontinent of large amount of urine. Incontinence care provided, barrier cream applied to excoriated buttocks. Foley placed where PEG tube had been draining light, yellow fluid with a large amount of sediment. Repo q2-4 hours. Sleeping mostly through the night. Bed alarm on, hourly rounding, safety maintained.

## 2022-03-18 NOTE — Plan of Care
Inpatient Physical Therapy Progress Note IP Adult PT Eval/Treat - 03/18/22 1359    Date of Visit / Treatment  Date of Visit / Treatment 03/18/22   Progress Report Due 03/31/22   End Time 1359    General Information  Subjective sleeping but rousable.  cooperative w/session.   General Observations pt in bed on 9-7; PEG, ostomy, positioner boots, bed alarm   Precautions/Limitations Fall Precautions;Bed alarm    Weight Bearing Status  Weight Bearing Status Comments WBAT BLE    Vital Signs and Orthostatic Vital Signs  Vital Signs Vital Signs Stable   Vital Signs Free text RA    Pain/Comfort  Pain Comment (Pre/Post Treatment Pain) cries out in pain w/touch/mobility of LEs.  did not quantify.  no apparent pain at rest; positioned to comfort; RN aware.    Cognition  Cognition Comments alert.  oriented to self only.  inconsistent command following, distracted.    Range of Motion  Range of Motion Comments ROM grossly WFL except decreased R shoulder end ROM, stiff.  decreased B ankle DF, unable to achieve neutral.  stiff BLEs, at least 60/60 hip/knee at EOB.    Musculoskeletal  LUE Muscle Strength Grading 2-->active movement with gravity eliminated   RUE Muscle Strength Grading 2-->active movement with gravity eliminated   LLE Muscle Strength Grading 2-->active movement with gravity eliminated   RLE Muscle Strength Grading 2-->active movement with gravity eliminated    Skin Assessment  Skin Assessment See Nursing Documentation    Balance  Balance Skills Training Comment sitting EOB w/max posterior support; standing deferred, not appropriate to progress.    Bed Mobility  Supine-to-Sit Independence/Assistance Level Total assist/dependent;Assist of 2;Verbal cues   Supine-to-Sit Assist Device Draw pad;Hand held assist;Head of bed elevated   Sit-to-Supine Independence/Assistance Level Total assist/dependent;Assist of 2   Sit-to-Supine Assist Device Draw pad;Hand held assist   Bed Mobility Comments sitting EOB x 5 min w/max assist x 2, leans posteriorly and to R side.    Museum/gallery curator Comments not appropriate at this time    Handoff Documentation  Handoff Patient in bed;Bed alarm;Patient instructed to call nursing for mobility;Discussed with nursing    PT- AM-PAC - Basic Mobility Screen- How much help from another person do you currently need.....  Turning from your back to your side while in a a flat bed without using rails? 1 - Total - Requires total assistance or cannot do it at all.   Moving from lying on your back to sitting on the side of a flat bed without using bed rails? 1 - Total - Requires total assistance or cannot do it at all.   Moving to and from a bed to a chair (including a wheelchair)? 1 - Total - Requires total assistance or cannot do it at all.   Standing up from a chair using your arms(e.g., wheelchair or bedside chair)? 1 - Total - Requires total assistance or cannot do it at all.   To walk in a hospital room? 1 - Total - Requires total assistance or cannot do it at all.   Climbing 3-5 steps with a railing? 1 - Total - Requires total assistance or cannot do it at all.   AMPAC Mobility Score 6   TARGET Highest Level of Mobility Mobility Level 2, Turn self in bed/bed activity/dependent transfer    ACTUAL Highest Level of Mobility Mobility Level 2, Turn self in bed/bed activity/ dependent transfer    Therapeutic Exercise  Therapeutic Exercise Comments  A/AA/PROM as tolerated x 4    Clinical Impression  Follow up Assessment 03-18-22: alert, oriented to self, cooperative w/session.  rigid extremities and trunk.  dependent x 2 for mobility including sitting EOB.  no balance reactions noted.  tolerated ROM w/assist.  will continue to follow to optimize functional status; pt is pending conservatorship and placement.    Frequency/Equipment Recommendations  PT Frequency 2x per week   What day of week is next treatment expected? Thursday   3-30  PT/PTA completing this assessment Karrington Studnicka    PT Discharge Summary  Physical Therapy Disposition Recommendation Short Term Rehab     Alvia Grove, PT   MHB [phone]

## 2022-03-18 NOTE — Plan of Care
Plan of Care Overview/ Patient Status    Problem: Adult Inpatient Plan of CareGoal: Readiness for Transition of CareOutcome: Interventions implemented as appropriate Pt discussed during TC rounds, not medically ready for discharge at this time. Pt pulled out her PEG tube,will be replaced on Thursday. Dispo progress is dependent upon pt's sister obtaining conservator over pt for LTC placement. Elinor Parkinson, RN, MSNCare ManagerMHB: [phone]

## 2022-03-18 NOTE — Plan of Care
Problem: Adult Inpatient Plan of CareGoal: Plan of Care ReviewFlowsheets (Taken 03/15/2022 0341 by Su Grand, RN)Plan of Care Reviewed With: patient Plan of Care Overview/ Patient Status    0700-1900VSS on RA. A&Ox1-2. Pt has ostomy x2. One for flatus, one for stool. Pt has PEG tube, get tube feed per [facility] and [facility] per MAR. Pt had a swallow eval today and passed. At approx. 1510 pt pulled PEG tube out, provider notified. IR came to the bedside to assess. Foley placed at PEG site to be re-evaluated in 72 hours after calorie count for 72 hours. If pt is able to tolerate food well, PEG will not be replaced. Pt is T&R q2h. Fall prevention plan is in place, bed in lowest position, call bell within reach, bed alarm on, will continue to monitor. Hourly rounding the performed. Safety maintained.

## 2022-03-18 NOTE — Progress Notes
[facility] Medicine Progress NoteAttending Provider: Henriette Combs, MDLOS: 35CC: encephalopathy                                                                          Subjective: Interim History: Pt pulled out PEG tube by accidentFoley placed in tractShe is pointing to her side and states it hurtsShe is calmPleasantly confusedStates she does not feel hungryReview of Allergies/Meds/Hx: Review of Allergies/Meds/Hx:I have reviewed the patient's: allergies, current scheduled medications, current infusions, and current prn medicationsScheduled Meds:[Held by provider] apixaban, 5 mg, Q12Hbanana flakes-t-galactooligos., 1 packet, BIDchlorhexidine gluconate, 15 mL, BIDElemental iron, 45 mg, BIDenoxaparin (LOVENOX) treatment, 80 mg, Q12Hfamotidine, 20 mg, Dailyfolic acid, 1 mg, Dailyniacin, 100 mg, Nightlypyridoxine (vitamin B6), 100 mg, Daily[Held by provider] rosuvastatin, 10 mg, Nightlysodium chloride, 3 mL, Q8Hthiamine, 100 mg, BIDContinuous Infusions:PRN Meds:.acetaminophen, dextrose (GLUCOSE) oral liquid 15 g **OR** fruit juice **OR** skim milk, dextrose (GLUCOSE) oral liquid 30 g **OR** fruit juice, dextrose injection, dextrose injection, glucagon, ondansetron **OR** ondansetron (ZOFRAN) IV Push, sodium chlorideObjective: Vitals:I have reviewed the patient's current vital signs as documented in the patient's EMR.   and Last 24 hours: Temp:  [97.6 ?F (36.4 ?C)-98.9 ?F (37.2 ?C)] 98.9 ?F (37.2 ?C)Pulse:  [85-95] 85Resp:  [16-18] 18BP: (123-136)/(78-90) 125/84SpO2:  [97 %-100 %] 97 %I/O's:I have reviewed the patient's current I&O's as documented in the EMR.Gross Totals (Last 24 hours) at 03/18/2022 1551Last data filed at 03/18/2022 0618Intake 360 ml Output 250 ml Net 110 ml Procedures:None Physical Exam General: no acute distress, awake, alert & oriented x1Head: atraumatic, normocephalicEyes: EOMI, PERRLA, sclerae anictericNeck: supple, no LADCV: S1, S2, RRR, no R/G/MResp: CTA B/L; no w/r/r/, normal effort, symmetric air entryAbd: bs +, soft, NT/ND, no rebound tenderness or guarding; LLQ ostomy site c/d/i; PEG tube removed and foley in placeExtrem: no pedal edema, w/w/p, no c/c/eSkin: warm, dry, no obvious rashes or lesionsLabs:I have reviewed the patient's labs within the last 24 hrs. Significant findings are see below.Last 24 hours: No results found for this or any previous visit (from the past 24 hour(s)).Diagnostics:I have reviewed the patient's Radiology report(s) within the last 48 hrs. Significant findings are see below.XR Chest PA or AP (Portable) Final Result    No consolidations to suggest pneumonia.  Reported And Signed By: Leilani Merl, MD    [facility] Radiology and Biomedical Imaging   XR Foot Bilateral AP Lateral and Oblique Final Result  1. Diffuse degenerative changes without evidence of acute osseous injury. There is no evidence of bony erosion.  Reported And Signed By: Merdis Delay, MD    [facility] Radiology and Biomedical Imaging   MRI Brain w wo IV Contrast Final Result  1. 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 2. Redemonstration microvascular disease no new new territorial restricted diffusion hemorrhage or midline shift. 3.  Motion limits assessment of the heterogeneous maxillary alveolus correlate with dental inspection Report Initiated By:  Neta Mends, MD  Reported And Signed By: Lynett Fish, MD    [facility] Radiology and Biomedical Imaging   MRI Total Spine w wo IV Contrast Final Result  1. Scoliotic deformity of the thoracolumbar spine. 2. Moderate degenerative spondylosis in the  cervical and lumbar spine as described above. 3. No obvious epidural abscess or paraspinal collection. No findings suspicious for discitis/osteomyelitis. Mild perifacet inflammatory changes identified at L4-5, likely degenerative. Mild increased T2 signal and enhancement within the posterior paraspinal muscles diffusely throughout the lumbar spine could be related to denervation or inflammation.  Examination is significantly degraded by patient motion and sagittal T2 signal changes within the spinal cord or abnormal intrathecal enhancement may not be apparent on this examination. Examination to be repeated with sedation if clinically indicated.  Report Initiated By:  Neta Mends, MD  Reported And Signed By: Aviva Kluver, MD    [facility] Radiology and Biomedical Imaging   XR Hips Bilateral AP and Lateral with AP Pelvis Final Result  US Abdomen Pelvis Vascular Study Complete ([facility]) Final Result    No evidence of portal vein or hepatic vein thrombosis.  Increased liver echogenicity, which is most commonly noted with hepatic steatosis. However, please note that more advanced forms of liver disease, including hepatic fibrosis/early cirrhosis may have a similar imaging appearance.   [place] Radiology Notify System Classification: Routine  Report Initiated By:  Kreg Shropshire, MD  Reported And Signed By: Verl Bangs, MD    [facility] Radiology and Biomedical Imaging   Hospitalist Procedure [facility]) Final Result  Hospitalist Procedure [facility]) Final Result  [place] Head wo IV Contrast Final Result Since the prior exam, there has been a significant increase in ventricular size and sylvian fissures. In the appropriate clinical setting normal pressure hydrocephalus can be considered.  Report Initiated By:  Consuela Mimes, MD  Reported And Signed By: Cherre Blanc, MD    [facility] Radiology and Biomedical Imaging   [place] Chest wo IV Contrast Final Result  Patchy groundglass opacities in bilateral lower lobes with 4 mm right lower lobe lung nodule, findings may represent infectious/inflammatory etiology in a proper clinical setting. Short-term follow-up can be obtained for resolution. Otherwise consider follow-up chest [place] in 12 months for stability.  Reported And Signed By: Antonieta Loveless, MD    [facility] Radiology and Biomedical Imaging   [place] Abdomen Pelvis w IV Contrast Final Result No collection in abdomen or pelvis.   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 Correlation with prior imaging is recommended.    Reported And Signed By: Madie Reno, MD    [facility] Radiology and Biomedical Imaging   CXR (portable) Final Result   No acute cardiothoracic abnormality.  Report Initiated By:  Tessie Eke, RRA  Reported And Signed By: Alberteen Sam, MD    [facility] Radiology and Biomedical Imaging   [place] RESULT SCAN Final Result  [place] RESULT SCAN Final Result  Outside Interpretation [place] Abdomen and/or Pelvis Final Result  [place] RESULT SCAN Final Result  XRAY RESULT SCAN Final Result  Outside Interpretation MRI Neuro Final Result   Only motion degraded T1 images are available for review which significantly limits evaluation. Mild ventriculomegaly. No focal mass or intracranial hemorrhage is identified.    Report Initiated By:  Renato Gails, MD  Reported And Signed By: Vicki Mallet, MD    [facility] Radiology and Biomedical Imaging   MRI RESULT SCAN Final Result  Outside Interpretation MRI Neuro Final Result Multilevel degenerative changes as described above. Report Initiated By:  Renato Gails, MD  Reported And Signed By: Vicki Mallet, MD    [facility] Radiology and Biomedical Imaging   Outside Interpretation MRI Neuro Final Result   Apparent multiple foci of T2 hyperintensity in the thoracic cord throughout its length including  at the level of the conus in the setting of motion artifact. It is unclear if these are artifactual or relate to lesions within the cord. Repeat T2 axial and T2 sagittal images of the thoracic spine are recommended.   Levoscoliotic curvature of the thoracic spine with no significant spinal canal stenosis or neural foraminal narrowing.   Report Initiated By:  Renato Gails, MD  Reported And Signed By: Vicki Mallet, MD    [facility] Radiology and Biomedical Imaging   MRI RESULT SCAN Final Result  [facility] MRI Cervical Spine Final Result  Outside Interpretation [place] Abdomen and/or Pelvis Final Result  [place] RESULT SCAN Final Result  Outside Interpretation [place] Abdomen and/or Pelvis Final Result  [place] RESULT SCAN Final Result  ED Procedural [place] Peripheral Venous Access    (Results Pending) IR Gastrointestinal procedures    (Results Pending) ECG/Tele Events: I have reviewed the patient's ECG as resulted in the EMR.Assessment: A 66 Y O F PMHx of RA, recent prolonged hospitalization for perforated diverticulitis (s/p ex Lap/R hemicolectomy with end ileostomy). Admitted 2/24 for LTACH placement and persistent encephalopathy. Found to be febrile without clear infectious source found, fevers resolved. Extensive work-up of encephalopathy and leg weakness unremarkable. Course c/b elevated LFTs which resolved, likely 2/2 DILI from Ceftriaxone or Zosyn. Stable and awaiting LTC placement, but with ongoing significant hypercalcemia.?Mental status appears overall stable/improving.Plan: # PEG tube dislodgement - accidentally dislodged- IR to evaluate- PEG needs replacement - Thursday- TF held for now# Dysphagia -- On TF + FWF- SLP consulted--for re-evaluation - 3/27 OK for puree and thin liquids- Re-eval for diet upgrade later- Calorie count- Requires 1: 1 feeding assistance- Aspiration precautions# Recurrent Fever/R-foot Cellulitis/Eschar - transient fever resolved, no leukocytosis. No clear source identified, possibly 2/2 DVT vs autoimmune. CXR neg, UA neg. CRP 25.8, procal neg. Xray feet with degenerative changes but no acute findings - Suspected source was R foot cellulitis/eschar- S/P CTX 2/21 and Zosyn 2/21-2/25; S/P Empiric ABX-Oxacillin --stopped with benign infectious work-up- Consulted Podiatry--appreciate recs- do not think that foot wound is the cause of the fevers- WBAT to BLE.No?indication for podiatric surgical intervention at this time. ?- Patient may follow-up with Dr. Gazes within 3-4 weeks of discharge. - Trend fever/CBC- Tylenol prn# Acute encephalopathy - # LE weakness -- Unclear etiology, possibly prolonged delirium iso hospitalization from Nov 2022 - currentMRI brain, MRI total spine unremarkable- LP unremarkable- Paraneoplastic and autoimmune encephalopathy panels negative- Overall somewhat improved cognitive status during admission, remains alert and oriented x 1-2- Neurology rec'd EMG to complete work-up of LE weakness but pt declined; Neurology does not recommend any further testing and signed off 3/8/23RESOLVED:# Hypercalcemia - acute on?chronic--resolvedPTH normal (28); work-up including PTH-rP (9), vitamin D testing, TSH, random cortisol unrevealing. Repeat PTH 33.6, am Cortisol 8.9. Fasting CTx (Collagen Type-1 Telopeptide)-elevated- Possibly d/t immobility or primary hyperparathyroidism; sent additional PTH testing with mineral metabolism level (send-out test)- Endocrine consult  recs: possible additional therapy pending PTH results. - S/P IVF 100-- --will dc- Continue FWF via G-tube 400 ml q6H- S/P Zolendronic acid 4 mg X1 (3/21)- Trend Ca and Albumin q3d while IP?# Elevated LFTs -- Resolved- [place] A/P w/ multiple peritoneal nodules- LFTs normal on admission but new elevation after 3d of admission- Work-up showed elevated IgG, smooth muscle antibodies c/f possible autoimmune hepatitisInitial concern that imaging showed peritoneal nodules ?carcinomatosis- On review of imaging, nodules improving, more likely to be continued resolution of debris from perforation- Liver tests resolved without intervention, so most likely DILI from antibiotics (ceftriaxone and Zosyn)-  Check LFTs weekly to ensure she is not evolving autoimmune hepatitis given multiple positive autoimmune markers?# Recurrent Hypokalemia - - Appears c/w GI losses with watery ileostomy stool--Banatrol added- Replaced via G-tube--will add daily dosing- Trend UOP/BMP/Cr prn?CHRONIC:# Hx Bilateral DVT:- Apixaban 5mg  BID-HELD- Continue Lovenox 80 mg q12H?# Iron deficiency anemia - chronic, hgb now stable# Elevated FLC ratio - -Hematology consulted & signed off - elevated FLC ratio felt to be reactive, plasma cell disorder unlikely ?# Rheumatoid Arthritis - has been off of MTX since November/acute illness, rheumatology signing off current ams/weakness not felt to be related to her RA, follow up outpatient -Bilateral LE DVT - ?provoked from recent surgery, cont lovenox, can resume apixaban at discharge?FEN: puree and thin liquidsDVT ppx: lovenoxCODE: FULLDispo: medical - needs PEG tube replacedpending sister applying for conservatorship; T19 pendingMedical decision making for this patient was moderate.I spent 35 minutes today on this encounter before, during and after the visit, reviewing labs and records, evaluating and examining the patient, entering orders and documenting the visit. Electronically Signed:Kenna Seward Fortunato Curling, MD, PhDDepartment of Internal MedicineYale Hospitalist ServicesAvailable on MHB3/28/20233:14 PM

## 2022-03-18 NOTE — Plan of Care
Plan of Care Overview/ Patient Status    0700-1500Patient is A&Ox2, oriented to situation and place. VSS on RA. Complains of bilateral leg pain w/ movement, tylenol administered and legs positioned. Pills crushed with pudding. Patient refusing all meals, ery poor PO intake, MD aware. Catheter in place of PEG site. Ostomy in place. AX2 T&R q2hr. See flow sheet for skin, dressing changed. Problem: Adult Inpatient Plan of CareGoal: Plan of Care ReviewOutcome: Interventions implemented as appropriateGoal: Patient-Specific Goal (Individualized)Outcome: Interventions implemented as appropriateGoal: Absence of Hospital-Acquired Illness or InjuryOutcome: Interventions implemented as appropriateGoal: Optimal Comfort and WellbeingOutcome: Interventions implemented as appropriateGoal: Readiness for Transition of CareOutcome: Interventions implemented as appropriate Problem: InfectionGoal: Absence of Infection Signs and SymptomsOutcome: Interventions implemented as appropriate Problem: Physical Therapy GoalsGoal: Physical Therapy GoalsDescription: PT GOALS - addressed 3/13/231. Patient will demonstrate active range of motion x 4 extremities in preparation for mobility2. Caregivers will be independent and safely implement a range of motion/positioning program to prevent loss of range of motion and skin integrity3. Patient will perform bed mobility with maximal assist4. Patient will tolerate sitting edge of bed >5 minutes with minimal assist5. Patient will perform transfers with moderate assist of 2 using rolling walker  Outcome: Interventions implemented as appropriate Problem: Occupational Therapy GoalsGoal: Occupational Therapy GoalsDescription: OT goals1. Pt will move to EOB for seated ADL mod I2. Pt will follow 3/4 simple one step motor commands IND3. Pt will sequence through set up ADL task INDOutcome: Interventions implemented as appropriate Problem: Impaired Wound HealingGoal: Optimal Wound HealingOutcome: Interventions implemented as appropriate Problem: Fall Injury RiskGoal: Absence of Fall and Fall-Related InjuryOutcome: Interventions implemented as appropriate Problem: Violence Risk or ActualGoal: Anger and Impulse ControlOutcome: Interventions implemented as appropriate Problem: Skin Injury Risk IncreasedGoal: Skin Health and IntegrityOutcome: Interventions implemented as appropriate Problem: Mobility ImpairmentGoal: Optimal MobilityOutcome: Interventions implemented as appropriate Problem: Aspiration (Enteral Nutrition)Goal: Absence of Aspiration Signs and SymptomsOutcome: Interventions implemented as appropriate Problem: Device-Related Complication Risk (Enteral Nutrition)Goal: Safe, Effective Therapy DeliveryOutcome: Interventions implemented as appropriate Problem: Confusion AcuteGoal: Optimal Cognitive FunctionOutcome: Interventions implemented as appropriate Problem: Pain AcuteGoal: Acceptable Pain Control and Functional AbilityOutcome: Interventions implemented as appropriate Problem: Speech Language Pathology GoalsGoal: Dysphagia Goals, SLPDescription: Pt will tolerate pureed diet with thin liquids w/o s/x of aspiration or adverse impact on respiratory status.Pt will participate in clinical upgrade of solids w/o s/sx of aspiration or adverse impact on respiratory status. Outcome: Interventions implemented as appropriate

## 2022-03-18 NOTE — Progress Notes
[facility]	Podiatry Progress NoteDate: 3/27/2023Patient Name: Danielle [name]: [id number] Attending Provider: Henriette Combs, MDCC: Bilateral foot woundsSubjective: Pt seen at bedside this a.m. with attending in NAEO.  NAD. Patient non-verbal. PMH: PSH: No past medical history on file. No past surgical history on file. Social History: Family History: Social History Tobacco Use ? Smoking status: Not on file ? Smokeless tobacco: Not on file Substance Use Topics ? Alcohol use: Not on file  No family history on file. Allergies: Patient has no known allergies.Review of Systems: Unable to assess 2/2 AMSVitals: Vitals:  03/17/22 0812 BP: 124/85 Pulse: 89 Resp: 18 Temp: 98.7 ?F (37.1 ?C) Physical Exam: Gen: NADHead: NormocephalicPsych: AAO x 2Chest: CTAB, no r/r/wCardio: RRR, S1, S2, no m/r/gAbd: Soft NT, ND, +bsVascular: DP/PT pulses palpable. Normal CRT to all digits. Foot is warm to touch. Pedal hair growth is absentNeuro: Unable to assess MSK: Unable to ROM digits when asked, unable to participate in MMTDerm: Deep tissue injury noted to the Right lateral foot without any open lesions. No drainage, purulence, tunneling, crepitus, fluctuance, malodor, proximal lymphagitic streaking, or other acute clinical signs of infection. DTI shape size and look very similar to prior physical examLabs: Lab Results Component Value Date  WBC 6.8 03/15/2022  HGB 10.3 (L) 03/15/2022  HCT 33.70 (L) 03/15/2022  CREATININE 0.28 (L) 03/15/2022  BUN 10 03/15/2022  CO2 22 03/15/2022  PLT 340 03/15/2022  INR 1.10 02/18/2022  GLU 110 (H) 03/15/2022  K 4.0 03/15/2022  NA 134 (L) 03/15/2022  CL 103 03/15/2022  ALT 17 03/15/2022  AST 26 03/15/2022  TSH 1.670 02/17/2022  HSCRP 25.8 (H) 03/14/2022  SEDRATE 98 (H) 02/19/2022 Imaging: XR Foot Bilateral AP Lateral and ObliqueResult Date: 3/7/2023AP, LATERAL AND OBLIQUE BILATERAL FOOT X-RAY Clinical Information: Baseline assessment. Assess for erosions. Comparison: None Findings: Left foot: The overall bony mineralization appears qualitatively diminished. There is evidence of joint space narrowing indicative of arthropathy involving the interphalangeal joint and metatarsal phalangeal joint of the first digit as well as the DIP and PIP joints of the remaining digits. There is mild hallux valgus as well as bunion formation. The moderate degenerative changes are noted in the midfoot. There is no acute osseous injury. Right foot: The overall bony mineralization appears qualitatively diminished. There is evidence of joint space narrowing indicative of arthropathy involving the interphalangeal joint and metatarsal phalangeal joint of the first digit as well as the DIP and PIP joints of the remaining digits. There is moderate to severe hallux valgus as well as bunion formation. The moderate degenerative changes are noted in the midfoot. There is no acute osseous injury. 1. Diffuse degenerative changes without evidence of acute osseous injury. There is no evidence of bony erosion. Reported And Signed By: Merdis Delay, MD  [facility] Radiology and Biomedical Imaging Micro/Path: None Assessment/Plan: Danielle [name] is a 66 y.o. female with Right lateral foot and and resolved Left medial eminence deep tissue injuries, stable. No open wounds or SOI. ?- Revaluated patient's wound with attending today.  Lateral eschar is very stable at this time.  No open wounds around the deep tissue injury, no crepitus fluctuance or drainage noted.  No other clinical signs of infection noted. Mild periwound erythema most likely due to pressure rather than infection. - Dressed with betadine allevyn - At this time the foot is not the cause of the patient's low-grade fever.- Would recommend broadening differential to urinary or GI causes for low-grade fever.- No dressing needed on the left medial eminence- Appreciate nursing continuing  With a Betadine with overlying DSD to the right DTI- Recommend WBAT to BLE-Will defer antibiotics per primary team - none indicated per podiatry standpointDispo: No indication for podiatric surgical intervention at this time.  Patient may follow-up with Dr. Tayon Parekh within 3-4 weeks of discharge.  Podiatry will continue to follow peripherally while in house. Please contact the resident on call with any acute changes, new issues, or questions.Seen with attending Dr. Darral Dash page on-call podiatry resident with questions:?	SEE AMION FOR CALL SCHEDULE Electronically Signed by Audrie Gallus, DPM, March 27, 2023I saw and evaluated Danielle [name] with the resident. I agree with the findings and the plan of care as documented in the resident note.Electronically Signed by Donn Pierini, DPM, March 17, 2022

## 2022-03-19 NOTE — Progress Notes
[facility] Medicine Progress NoteAttending Provider: Henriette Combs, MDLOS: 36CC: encephalopathy                                                                          Subjective: Interim History: Denied any pain todayShe states she doesn't want anything to eat - low appetiteAsking for orange sodaShe is calmPleasantly confusedStates she does not feel hungryReview of Allergies/Meds/Hx: Review of Allergies/Meds/Hx:I have reviewed the patient's: allergies, current scheduled medications, current infusions, and current prn medicationsScheduled Meds:[Held by provider] apixaban, 5 mg, Q12Hbanana flakes-t-galactooligos., 1 packet, BIDchlorhexidine gluconate, 15 mL, BIDElemental iron, 45 mg, BIDenoxaparin (LOVENOX) treatment, 80 mg, Q12Hfamotidine, 20 mg, Dailyfolic acid, 1 mg, Dailyniacin, 100 mg, Nightlypyridoxine (vitamin B6), 100 mg, Daily[Held by provider] rosuvastatin, 10 mg, Nightlysodium chloride, 3 mL, Q8Hthiamine, 100 mg, BIDContinuous Infusions:PRN Meds:.acetaminophen, dextrose (GLUCOSE) oral liquid 15 g **OR** fruit juice **OR** skim milk, dextrose (GLUCOSE) oral liquid 30 g **OR** fruit juice, dextrose injection, dextrose injection, glucagon, ondansetron **OR** ondansetron (ZOFRAN) IV Push, sodium chlorideObjective: Vitals:I have reviewed the patient's current vital signs as documented in the patient's EMR.   and Last 24 hours: Temp:  [97.9 ?F (36.6 ?C)-98.9 ?F (37.2 ?C)] 97.9 ?F (36.6 ?C)Pulse:  [81-89] 81Resp:  [18-20] 20BP: (106-125)/(68-84) 106/68SpO2:  [96 %-99 %] 99 %I/O's:I have reviewed the patient's current I&O's as documented in the EMR.Gross Totals (Last 24 hours) at 03/19/2022 1403Last data filed at 03/18/2022 2300Intake -- Output 400 ml Net -400 ml Procedures:None Physical Exam General: no acute distress, awake, alert & oriented x1Head: atraumatic, normocephalicEyes: EOMI, PERRLA, sclerae anictericNeck: supple, no LADCV: S1, S2, RRR, no R/G/MResp: CTA B/L; no w/r/r/, normal effort, symmetric air entryAbd: bs +, soft, NT/ND, no rebound tenderness or guarding; LLQ ostomy site c/d/i; PEG tube removed and foley in placeExtrem: no pedal edema, w/w/p, no c/c/eSkin: warm, dry, no obvious rashes or lesionsLabs:I have reviewed the patient's labs within the last 24 hrs. Significant findings are see below.Last 24 hours: No results found for this or any previous visit (from the past 24 hour(s)).Diagnostics:I have reviewed the patient's Radiology report(s) within the last 48 hrs. Significant findings are see below.XR Chest PA or AP (Portable) Final Result    No consolidations to suggest pneumonia.  Reported And Signed By: Leilani Merl, MD    [facility] Radiology and Biomedical Imaging   XR Foot Bilateral AP Lateral and Oblique Final Result  1. Diffuse degenerative changes without evidence of acute osseous injury. There is no evidence of bony erosion.  Reported And Signed By: Merdis Delay, MD    [facility] Radiology and Biomedical Imaging   MRI Brain w wo IV Contrast Final Result  1. 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 2. Redemonstration microvascular disease no new new [place] restricted diffusion hemorrhage or midline shift. 3.  Motion limits assessment of the heterogeneous maxillary alveolus correlate with dental inspection Report Initiated By:  Neta Mends, MD  Reported And Signed By: Lynett Fish, MD    [facility] Radiology and Biomedical Imaging   MRI Total Spine w wo IV Contrast Final Result  1. Scoliotic deformity of the thoracolumbar spine. 2. Moderate degenerative spondylosis in the cervical and lumbar  spine as described above. 3. No obvious epidural abscess or paraspinal collection. No findings suspicious for discitis/osteomyelitis. Mild perifacet inflammatory changes identified at L4-5, likely degenerative. Mild increased T2 signal and enhancement within the posterior paraspinal muscles diffusely throughout the lumbar spine could be related to denervation or inflammation.  Examination is significantly degraded by patient motion and sagittal T2 signal changes within the spinal cord or abnormal intrathecal enhancement may not be apparent on this examination. Examination to be repeated with sedation if clinically indicated.  Report Initiated By:  Neta Mends, MD  Reported And Signed By: Aviva Kluver, MD    [facility] Radiology and Biomedical Imaging   XR Hips Bilateral AP and Lateral with AP Pelvis Final Result  US Abdomen Pelvis Vascular Study Complete ([facility]) Final Result    No evidence of portal vein or hepatic vein thrombosis.  Increased liver echogenicity, which is most commonly noted with hepatic steatosis. However, please note that more advanced forms of liver disease, including hepatic fibrosis/early cirrhosis may have a similar imaging appearance.    Radiology Notify System Classification: Routine  Report Initiated By:  Kreg Shropshire, MD  Reported And Signed By: Verl Bangs, MD    [facility] Radiology and Biomedical Imaging   Hospitalist Procedure [facility]) Final Result  Hospitalist Procedure [facility]) Final Result  [place] wo IV Contrast Final Result Since the prior exam, there has been a significant increase in ventricular size and sylvian fissures. In the appropriate clinical setting normal pressure hydrocephalus can be considered.  Report Initiated By:  Consuela Mimes, MD  Reported And Signed By: Cherre Blanc, MD    [facility] Radiology and Biomedical Imaging   [place] Chest wo IV Contrast Final Result  Patchy groundglass opacities in bilateral lower lobes with 4 mm right lower lobe lung nodule, findings may represent infectious/inflammatory etiology in a proper clinical setting. Short-term follow-up can be obtained for resolution. Otherwise consider follow-up chest [place] in 12 months for stability.  Reported And Signed By: Antonieta Loveless, MD    [facility] Radiology and Biomedical Imaging   [place] Abdomen Pelvis w IV Contrast Final Result No collection in abdomen or pelvis.   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 Correlation with prior imaging is recommended.    Reported And Signed By: Madie Reno, MD    [facility] Radiology and Biomedical Imaging   CXR (portable) Final Result   No acute cardiothoracic abnormality.  Report Initiated By:  Tessie Eke, RRA  Reported And Signed By: Alberteen Sam, MD    [facility] Radiology and Biomedical Imaging   [place] RESULT SCAN Final Result  [place] RESULT SCAN Final Result  Outside Interpretation [place] Abdomen and/or Pelvis Final Result  [place] RESULT SCAN Final Result  XRAY RESULT SCAN Final Result  Outside Interpretation MRI Neuro Final Result   Only motion degraded T1 images are available for review which significantly limits evaluation. Mild ventriculomegaly. No focal mass or intracranial hemorrhage is identified.    Report Initiated By:  Renato Gails, MD  Reported And Signed By: Vicki Mallet, MD    [facility] Radiology and Biomedical Imaging   MRI RESULT SCAN Final Result  Outside Interpretation MRI Neuro Final Result Multilevel degenerative changes as described above. Report Initiated By:  Renato Gails, MD  Reported And Signed By: Vicki Mallet, MD    [facility] Radiology and Biomedical Imaging   Outside Interpretation MRI Neuro Final Result   Apparent multiple foci of T2 hyperintensity in the thoracic cord throughout its length including at the level  of the conus in the setting of motion artifact. It is unclear if these are artifactual or relate to lesions within the cord. Repeat T2 axial and T2 sagittal images of the thoracic spine are recommended.   Levoscoliotic curvature of the thoracic spine with no significant spinal canal stenosis or neural foraminal narrowing.   Report Initiated By:  Renato Gails, MD  Reported And Signed By: Vicki Mallet, MD    [facility] Radiology [facility] Biomedical [facility]   MRI RESULT SCAN Final Result  [facility] MRI Cervical Spine Final Result  Outside Interpretation [place] Abdomen and/or Pelvis Final Result  [place] RESULT SCAN Final Result  Outside Interpretation [place] Abdomen and/or Pelvis Final Result  [place] RESULT SCAN Final Result  ED Procedural [place] Peripheral Venous Access    (Results Pending) IR Gastrointestinal procedures    (Results Pending) ECG/Tele Events: I have reviewed the patient's ECG as resulted in the EMR.Assessment: A 66 Y O F PMHx of RA, recent prolonged hospitalization for perforated diverticulitis (s/p ex Lap/R hemicolectomy with end ileostomy). Admitted 2/24 for LTACH placement and persistent encephalopathy. Found to be febrile without clear infectious source found, fevers resolved. Extensive work-up of encephalopathy and leg weakness unremarkable. Course c/b elevated LFTs which resolved, likely 2/2 DILI from Ceftriaxone or Zosyn. Stable and awaiting LTC placement, but with ongoing significant hypercalcemia.?Mental status appears overall stable/improving.Plan: # PEG tube dislodgement - accidentally dislodged- IR to evaluate- PEG needs replacement - Thursday- TF held for now# Dysphagia -- On TF + FWF- SLP consulted--for re-evaluation - 3/27 OK for puree and thin liquids- Re-eval for diet upgrade later- Calorie count- Requires 1: 1 feeding assistance- Aspiration precautions# Recurrent Fever/R-foot Cellulitis/Eschar - transient fever resolved, no leukocytosis. No clear source identified, possibly 2/2 DVT vs autoimmune. CXR neg, UA neg. CRP 25.8, procal neg. Xray feet with degenerative changes but no acute findings - Suspected source was R foot cellulitis/eschar- S/P CTX 2/21 and Zosyn 2/21-2/25; S/P Empiric ABX-Oxacillin --stopped with negative infectious work-up- Consulted Podiatry--appreciate recs- do not think that foot wound is the cause of the fevers- WBAT to BLE.No?indication for podiatric surgical intervention at this time. ?- Patient may follow-up with Dr. Gazes within 3-4 weeks of discharge. - Trend fever/CBC- Tylenol prn# Acute encephalopathy - # LE weakness -- Unclear etiology, possibly prolonged delirium iso hospitalization from Nov 2022 - currentMRI brain, MRI total spine unremarkable- LP unremarkable- Paraneoplastic and autoimmune encephalopathy panels negative- Overall somewhat improved cognitive status during admission, remains alert and oriented x 1-2- Neurology rec'd EMG to complete work-up of LE weakness but pt declined; Neurology does not recommend any further testing and signed off 3/8/23RESOLVED:# Hypercalcemia - acute on?chronic--resolvedPTH normal (28); work-up including PTH-rP (9), vitamin D testing, TSH, random cortisol unrevealing. Repeat PTH 33.6, am Cortisol 8.9. Fasting CTx (Collagen Type-1 Telopeptide)-elevated- Possibly d/t immobility or primary hyperparathyroidism; sent additional PTH testing with mineral metabolism level (send-out test)- Endocrine consult  recs: possible additional therapy pending PTH results. - S/P IVF 100-- --will dc- Continue FWF via G-tube 400 ml q6H- S/P Zolendronic acid 4 mg X1 (3/21)- Trend Ca and Albumin q3d while IP?# Elevated LFTs -- Resolved- [place] A/P w/ multiple peritoneal nodules- LFTs normal on admission but new elevation after 3d of admission- Work-up showed elevated IgG, smooth muscle antibodies c/f possible autoimmune hepatitisInitial concern that imaging showed peritoneal nodules ?carcinomatosis- On review of imaging, nodules improving, more likely to be continued resolution of debris from perforation- Liver tests resolved without intervention, so most likely DILI from antibiotics (ceftriaxone and Zosyn)- Check LFTs weekly  to ensure she is not evolving autoimmune hepatitis given multiple positive autoimmune markers?# Recurrent Hypokalemia - - Appears c/w GI losses with watery ileostomy stool--Banatrol added- Replaced via G-tube--will add daily dosing- Trend UOP/BMP/Cr prn?CHRONIC:# Hx Bilateral DVT:- Apixaban 5mg  BID-HELD- Continue Lovenox 80 mg q12H?# Iron deficiency anemia - chronic, hgb now stable# Elevated FLC ratio - -Hematology consulted & signed off - elevated FLC ratio felt to be reactive, plasma cell disorder unlikely ?# Rheumatoid Arthritis - has been off of MTX since November/acute illness, rheumatology signing off current ams/weakness not felt to be related to her RA, follow up outpatient -Bilateral LE DVT - ?provoked from recent surgery, cont lovenox, can resume apixaban at discharge?FEN: puree and thin liquidsDVT ppx: lovenoxCODE: FULLDispo: medical - needs PEG tube replacedpending sister applying for conservatorship; T19 pendingMedical decision making for this patient was moderate.I spent 35 minutes today on this encounter before, during and after the visit, reviewing labs and records, evaluating and examining the patient, entering orders and documenting the visit. Electronically Signed:Alfreida Steffenhagen Fortunato Curling, MD, PhDDepartment of Internal MedicineYale Hospitalist ServicesAvailable on MHB3/29/20233:14 PM

## 2022-03-19 NOTE — Plan of Care
Plan of Care Overview/ Patient Status    1500-1900Assumed care at 1500. No significant occurences. Pt is A&Ox2. VSS on R.A. Has pain upon movement. Poor P.O. intake. T&R q2hrs. Safety maintained with bed in lowest position.

## 2022-03-19 NOTE — Plan of Care
Plan of Care Overview/ Patient Status    AO to self got the day of her birthday correct but the year wrong. Thought she was at her brothers house. Continual re-orientation provided for Pt. Placed Pt on a low air loss mattress. Repositioning q2 hrs while awake. Performed ROM on all extremities, passive LE, bilat arms have some resistance and agility. Has a thirst and will drink entire juice at once, offered drinks throughout the shift. Ostomy bag changed, mucous fistula bag changed. Peristoma skin is intact. VSS on RA. Pt does not attempt to get OOB. Bed alarm is on, safety maintained, hourly rounding by staff.

## 2022-03-19 NOTE — Plan of Care
Plan of Care Overview/ Patient Status    0700-1900A&O:x2 disoriented to time and situation VS: WDL Resp: WDL denies SOB/DOE GI/GU: ileostomy, PEG site maintained with foley draining to bag. Incontinent of urine. Care provided as needed.Diet: puree, thin liquids. 1:1 feed. Calorie count.  Skin: see flowsheetsPain: BLE with movement Mobility: totalAccess: PIV RUE c/d/i patentPills:crushed in puree Safety maintained, call bell in reach, bed alarm set, rounding continued.  Vitals:  03/18/22 2329 03/19/22 0523 03/19/22 0824 03/19/22 1616 BP: 112/73 112/73 106/68 128/84 Pulse: 89 86 81 78 Resp:  20 20 18  Temp: 98.1 ?F (36.7 ?C) 97.9 ?F (36.6 ?C)  98.7 ?F (37.1 ?C) TempSrc: Axillary Axillary Axillary Oral SpO2: 96% 97% 99% 99% Weight:   72.4 kg  Height:

## 2022-03-20 MED ORDER — MELATONIN 3 MG TABLET
3 mg | Freq: Once | ORAL | Status: CP
Start: 2022-03-20 — End: ?
  Administered 2022-03-21: 01:00:00 3 mg via ORAL

## 2022-03-20 NOTE — Progress Notes
[facility] Medicine Progress NoteAttending Provider: Henriette Combs, MDLOS: 37CC: encephalopathy                                                                          Subjective: Interim History: Denied any pain todayStates she does not feel hungryShe states she doesn't want anything to eat - low appetiteAte meds w/ apple sauceShe is calmPleasantly confusedPer RN approx 25% meals, takes Ensure clearReview of Allergies/Meds/Hx: Review of Allergies/Meds/Hx:I have reviewed the patient's: allergies, current scheduled medications, current infusions, and current prn medicationsScheduled Meds:[Held by provider] apixaban, 5 mg, Q12Hbanana flakes-t-galactooligos., 1 packet, BIDchlorhexidine gluconate, 15 mL, BIDElemental iron, 45 mg, BIDenoxaparin (LOVENOX) treatment, 80 mg, Q12Hfamotidine, 20 mg, Dailyfolic acid, 1 mg, Dailyniacin, 100 mg, Nightlypyridoxine (vitamin B6), 100 mg, Daily[Held by provider] rosuvastatin, 10 mg, Nightlysodium chloride, 3 mL, Q8Hthiamine, 100 mg, BIDContinuous Infusions:PRN Meds:.acetaminophen, dextrose (GLUCOSE) oral liquid 15 g **OR** fruit juice **OR** skim milk, dextrose (GLUCOSE) oral liquid 30 g **OR** fruit juice, dextrose injection, dextrose injection, glucagon, ondansetron **OR** ondansetron (ZOFRAN) IV Push, sodium chlorideObjective: Vitals:I have reviewed the patient's current vital signs as documented in the patient's EMR.   and Last 24 hours: Temp:  [97.9 ?F (36.6 ?C)-98.7 ?F (37.1 ?C)] 98.4 ?F (36.9 ?C)Pulse:  [71-91] 84Resp:  [16-20] 20BP: (116-133)/(75-84) 133/81SpO2:  [95 %-100 %] 100 %I/O's:I have reviewed the patient's current I&O's as documented in the EMR.Gross Totals (Last 24 hours) at 03/20/2022 1348Last data filed at 03/20/2022 1300Intake 837 ml Output 1325 ml Net -488 ml Procedures:None Physical Exam General: no acute distress, awake, alert & oriented x1Head: atraumatic, normocephalicEyes: EOMI, PERRLA, sclerae anictericNeck: supple, no LADCV: S1, S2, RRR, no R/G/MResp: CTA B/L; no w/r/r/, normal effort, symmetric air entryAbd: bs +, soft, NT/ND, no rebound tenderness or guarding; LLQ ostomy site c/d/i; PEG tube removed and foley in placeExtrem: no pedal edema, w/w/p, no c/c/eSkin: warm, dry, no obvious rashes or lesionsLabs:I have reviewed the patient's labs within the last 24 hrs. Significant findings are see below.Last 24 hours: No results found for this or any previous visit (from the past 24 hour(s)).Diagnostics:I have reviewed the patient's Radiology report(s) within the last 48 hrs. Significant findings are see below.XR Chest PA or AP (Portable) Final Result    No consolidations to suggest pneumonia.  Reported And Signed By: Leilani Merl, MD    [facility] Radiology and Biomedical Imaging   XR Foot Bilateral AP Lateral and Oblique Final Result  1. Diffuse degenerative changes without evidence of acute osseous injury. There is no evidence of bony erosion.  Reported And Signed By: Merdis Delay, MD    [facility] Radiology and Biomedical Imaging   MRI Brain w wo IV Contrast Final Result  1. 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 2. Redemonstration microvascular disease no new [place] restricted diffusion hemorrhage or midline shift. 3.  Motion limits assessment of the heterogeneous maxillary alveolus correlate with dental inspection Report Initiated By:  Neta Mends, MD  Reported And Signed By: Lynett Fish, MD    [facility] Radiology and Biomedical Imaging   MRI Total Spine w wo IV Contrast Final Result  1. Scoliotic deformity of the thoracolumbar spine.  2. 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 3. No obvious epidural abscess or paraspinal collection. No findings suspicious for discitis/osteomyelitis. Mild perifacet inflammatory changes identified at L4-5, likely degenerative. Mild increased T2 signal and enhancement within the posterior paraspinal muscles diffusely throughout the lumbar spine could be related to denervation or inflammation.  Examination is significantly degraded by patient motion and sagittal T2 signal changes within the spinal cord or abnormal intrathecal enhancement may not be apparent on this examination. Examination to be repeated with sedation if clinically indicated.  Report Initiated By:  Neta Mends, MD  Reported And Signed By: Aviva Kluver, MD    [facility] Radiology and Biomedical Imaging   XR Hips Bilateral AP and Lateral with AP Pelvis Final Result  US Abdomen Pelvis Vascular Study Complete ([facility]) Final Result    No evidence of portal vein or hepatic vein thrombosis.  Increased liver echogenicity, which is most commonly noted with hepatic steatosis. However, please note that more advanced forms of liver disease, including hepatic fibrosis/early cirrhosis may have a similar imaging appearance.   [place] [facility] Notify System Classification: Routine  Report Initiated By:  Kreg Shropshire, MD  Reported And Signed By: Verl Bangs, MD    [facility] Radiology and Biomedical Imaging   Hospitalist Procedure [facility]) Final Result  Hospitalist Procedure [facility]) Final Result  [place] Head wo IV Contrast Final Result Since the prior exam, there has been a significant increase in ventricular size and sylvian fissures. In the appropriate clinical setting normal pressure hydrocephalus can be considered.  Report Initiated By:  Consuela Mimes, MD  Reported And Signed By: Cherre Blanc, MD    [facility] Radiology and Biomedical Imaging   [place] Chest wo IV Contrast Final Result Patchy groundglass opacities in bilateral lower lobes with 4 mm right lower lobe lung nodule, findings may represent infectious/inflammatory etiology in a proper clinical setting. Short-term follow-up can be obtained for resolution. Otherwise consider follow-up chest [place] in 12 months for stability.  Reported And Signed By: Antonieta Loveless, MD    [facility] Radiology and Biomedical Imaging   [place] Abdomen Pelvis w IV Contrast Final Result No collection in abdomen or pelvis.   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 Correlation with prior imaging is recommended.    Reported And Signed By: Madie Reno, MD    [facility] Radiology and Biomedical Imaging   CXR (portable) Final Result   No acute cardiothoracic abnormality.  Report Initiated By:  Tessie Eke, RRA  Reported And Signed By: Alberteen Sam, MD    [place] Radiology and Biomedical Imaging   [place] RESULT SCAN Final Result  [place] RESULT SCAN Final Result  Outside Interpretation [place] Abdomen and/or Pelvis Final Result  [place] RESULT SCAN Final Result  XRAY RESULT SCAN Final Result  Outside Interpretation MRI Neuro Final Result   Only motion degraded T1 images are available for review which significantly limits evaluation. Mild ventriculomegaly. No focal mass or intracranial hemorrhage is identified.    Report Initiated By:  Renato Gails, MD  Reported And Signed By: Vicki Mallet, MD    [facility] Radiology and Biomedical Imaging   MRI RESULT SCAN Final Result  Outside Interpretation MRI Neuro Final Result Multilevel degenerative changes as described above. Report Initiated By:  Renato Gails, MD  Reported And Signed By: Vicki Mallet, MD    [facility] Radiology and Biomedical Imaging   Outside Interpretation MRI Neuro Final Result   Apparent multiple foci of T2 hyperintensity in the thoracic  cord throughout its length including at the level of the conus in the setting of motion artifact. It is unclear if these are artifactual or relate to lesions within the cord. Repeat T2 axial and T2 sagittal images of the thoracic spine are recommended.   Levoscoliotic curvature of the thoracic spine with no significant spinal canal stenosis or neural foraminal narrowing.   Report Initiated By:  Renato Gails, MD  Reported And Signed By: Vicki Mallet, MD    [facility] Radiology and Biomedical Imaging   MRI RESULT SCAN Final Result  [facility] MRI Cervical Spine Final Result  Outside Interpretation [place] Abdomen and/or Pelvis Final Result  [place] RESULT SCAN Final Result  Outside Interpretation [place] Abdomen and/or Pelvis Final Result  [place] RESULT SCAN Final Result  ED Procedural [place] Peripheral Venous Access    (Results Pending) IR Gastrointestinal procedures    (Results Pending) ECG/Tele Events: I have reviewed the patient's ECG as resulted in the EMR.Assessment: A 66 Y O F PMHx of RA, recent prolonged hospitalization for perforated diverticulitis (s/p ex Lap/R hemicolectomy with end ileostomy). Admitted 2/24 for LTACH placement and persistent encephalopathy. Found to be febrile without clear infectious source found, fevers resolved. Extensive work-up of encephalopathy and leg weakness unremarkable. Course c/b elevated LFTs which resolved, likely 2/2 DILI from Ceftriaxone or Zosyn. Stable and awaiting LTC placement, but with ongoing significant hypercalcemia.?Mental status appears overall stable/improving.Plan: # PEG tube dislodgement - accidentally dislodged- IR to evaluate- PEG needs replacement - Friday 3/31- TF held for now# Dysphagia -- On TF + FWF- SLP consulted--for re-evaluation - 3/27 OK for puree and thin liquids- Re-eval for diet upgrade later- Calorie count- Nutrition consult- Requires 1: 1 feeding assistance- Aspiration precautions# Recurrent Fever/R-foot Cellulitis/Eschar - transient fever resolved, no leukocytosis. No clear source identified, possibly 2/2 DVT vs autoimmune. CXR neg, UA neg. CRP 25.8, procal neg. Xray feet with degenerative changes but no acute findings - Suspected source was R foot cellulitis/eschar- S/P CTX 2/21 and Zosyn 2/21-2/25; S/P Empiric ABX-Oxacillin --stopped with negative infectious work-up- Consulted Podiatry--appreciate recs- do not think that foot wound is the cause of the fevers- WBAT to BLE.No?indication for podiatric surgical intervention at this time. ?- Patient may follow-up with Dr. Gazes within 3-4 weeks of discharge. - Trend fever/CBC- Tylenol prn# Acute encephalopathy - # LE weakness -- Unclear etiology, possibly prolonged delirium iso hospitalization from Nov 2022 - currentMRI brain, MRI total spine unremarkable- LP unremarkable- Paraneoplastic and autoimmune encephalopathy panels negative- Overall somewhat improved cognitive status during admission, remains alert and oriented x 1-2- Neurology rec'd EMG to complete work-up of LE weakness but pt declined; Neurology does not recommend any further testing and signed off 3/8/23RESOLVED:# Hypercalcemia - acute on?chronic--resolvedPTH normal (28); work-up including PTH-rP (9), vitamin D testing, TSH, random cortisol unrevealing. Repeat PTH 33.6, am Cortisol 8.9. Fasting CTx (Collagen Type-1 Telopeptide)-elevated- Possibly d/t immobility or primary hyperparathyroidism; sent additional PTH testing with mineral metabolism level (send-out test)- Endocrine consult  recs: possible additional therapy pending PTH results. - S/P IVF 100-- --will dc- Continue FWF via G-tube 400 ml q6H- S/P Zolendronic acid 4 mg X1 (3/21)- Trend Ca and Albumin q3d while IP?# Elevated LFTs -- Resolved- [place] A/P w/ multiple peritoneal nodules- LFTs normal on admission but new elevation after 3d of admission- Work-up showed elevated IgG, smooth muscle antibodies c/f possible autoimmune hepatitisInitial concern that imaging showed peritoneal nodules ?carcinomatosis- On review of imaging, nodules improving, more likely to be continued resolution of debris from perforation- Liver tests resolved without intervention, so  most likely DILI from antibiotics (ceftriaxone and Zosyn)- Check LFTs weekly to ensure she is not evolving autoimmune hepatitis given multiple positive autoimmune markers?# Recurrent Hypokalemia - - Appears c/w GI losses with watery ileostomy stool--Banatrol added- Replaced via G-tube--will add daily dosing- Trend UOP/BMP/Cr prn?CHRONIC:# Hx Bilateral DVT:- Apixaban 5mg  BID-HELD- Continue Lovenox 80 mg q12H?# Iron deficiency anemia - chronic, hgb now stable# Elevated FLC ratio - -Hematology consulted & signed off - elevated FLC ratio felt to be reactive, plasma cell disorder unlikely ?# Rheumatoid Arthritis - has been off of MTX since November/acute illness, rheumatology signing off current ams/weakness not felt to be related to her RA, follow up outpatient -Bilateral LE DVT - ?provoked from recent surgery, cont lovenox, can resume apixaban at discharge?FEN: puree and thin liquidsDVT ppx: lovenoxCODE: FULLDispo: medical - needs PEG tube replacedpending sister applying for conservatorship; T19 pendingMedical decision making for this patient was moderate.I spent 35 minutes today on this encounter before, during and after the visit, reviewing labs and records, evaluating and examining the patient, entering orders and documenting the visit. Electronically Signed:Pecola Haxton Fortunato Curling, MD, PhDDepartment of Internal MedicineYale Hospitalist ServicesAvailable on MHB3/30/20233:14 PM

## 2022-03-20 NOTE — Plan of Care
Danielle [name]					Location: EP [id number] y.o., female				Attending: Henriette Combs, MD	Admit Date: 02/11/2022			[id number] LOS: 37 days FOLLOW-UP NUTRITION ASSESSMENTDIET ORDER:  Dietary Orders (From admission, onward)     Start     Ordered  03/20/22 6213  Diet Pureed  DIET EFFECTIVE NOW      Question Answer Comment Texture: Pureed  Initiate Nutrition Management Protocol (Yes/No?) Yes - Initiate Protocol    03/20/22 0837  03/19/22 1717  Nutrition Supplements  (Nutrition Supplement Panel)  Once      Comments: Important:  When ordering a supplement- please order only what facility provides (see suffix) Question Answer Comment Adult Supplements: Ensure Clear Apple (GH,BH,YNH,SRC,MH,LMH,WH)  Supplement Frequency: TID    03/19/22 1716    ANTHROPOMETRICS:Height:???70Admit wt:?no recent wt to assess.?Current wt:?79.4 kg (02/14/22)?- manage order placed to check wt now and every Monday after. Dosing Wt:?78?kg (based on?hamwi +10%)BMI:?25??(based on?current wt)?Additional wt information:Ht confirmed with pt: NO?- pt not able to provide hx. Pt not in a bed with scale. Pt last known wt was 98.6 kg (12/13/2018).?Wt loss?confirmed with admit wt?as pt no longer weighs?98 kg.?Time line for wt loss in not clear and pt cannot report.??of note, pt is +29L since admision. 1+ trace edema per flowsheets.Wt:03/20/22 	72.6 kg	Bed03/29/23 	72.4 kg	Bed12/23/19	98.6 kgESTIMATED NUTRITION REQUIREMENTS:Kcal/day:?1950??????????????(25 ?kcal/kg)Protein/day:?94 ?????????????(1.2gm/kg)Fluids/day:?1950 mL/d?(36mL/kcal) ONLY as medical and fluid status allows and only per team discretion. Needs based on:?Dosing Wt (78?kg), ? Carcinomatosis, maintenance.?NUTRITION ASSESSMENT: Chart reviewed for follow up assessment and calorie count. Meds and labs reviewed. TF discontinued on 3/27. Pt progressed to pureed diet with thin liquids per SLP recommendations (see note from 3/27). Visited pt at bedside. Pt stated that this was a bad time to visit and asked that I return another day. Respected pt request and left room. RN notes report poor po intake, but drinking adequate fluids. Flowsheet documentation reflects RN notes report of poor po intake. Pt is not meeting nutrition needs. Calorie count provided below shows documented intake over 3 days, suspect documentation is accurate. Of note; Na 134, trending down- suspect d/t fluid overload- 1+ edema noted in flowsheets; Cr 0.28, trending up- suspect d/t poor po intake. Ileostomy output ranging from 400-900 mL/ day, Banatrol BID.Pt is ordered for ferrous sulfate BID, Folvite daily, Niacin nightly, pyridoxidine dialy, Thiamine BID.Calorie Count:3/27: No intake recorded3/28: No intake recorded3/29: 365 kcal (~19% of estimated needs), 10g protein (~10% of estimated needs)- it is clear that patient is note eating and plan of care is to have PEG replaced 3/31. Would restart tube feeds in setting of inability to support nutrition needs through po diet alone. Would continue to allow po intake for comfort with bulk of nutrition coming from tube feeds once able. Cultural/Religious/Ethnic Needs: Unknown Food Allergies: NKFANUTRITION DIAGNOSIS:Severe malnutrition r/t depletion aeb moderate muscle and fat depletion.- continues?INTERVENTIONS/RECOMMENDATIONS: Meals and Snacks:- Continue pureed diet with thin liquids per SLP recommendations- No need to continue calorie counts at this time as it is clear pt is not eating sufficiently and will require tube feeds for nutritional support. - Encourage good po intake and use of ONS to help meet calorie and nutrition needs. Goal: Patient to meet  > 50-75% of estimated nutrition needs prior to next nutrition assessmentNutrition Supplement Therapy:- Continue Ensure clear apple TID- Will add Ensure Plus HP TID to diet orderGoal:  Patient to consume an average of 2-3 oral nutrition supplements/day prior to next nutrition assessment Enteral Nutrition:?	Formula-?JEVITY 1.5 @ 1 carton bolus TID (@9am ,1pm,5pm) with 1.5 cartons at 9pm to total 5.5 cartons daily)-	Provides:?1.3L,?1955kcal,?82gr Protein,?988?mL free water/d-	Additional Free water @?960?mL/d as  medical/ fluid status allows at Teams discretion.??	Goal: ?-	Patient will receive >90% of goal volume TF/ TPN prior to next nutrition assessment- met will continue?Discharge Planning and Transfer of Nutrition Care:  Nutrition related discharge needs still being determined at this time, will continue to follow  MONITORING / EVALUATION:Food/Nutrition-Related History:Food and Nutrient IntakeAnthropometric Measurements:Weight Biochemical Data, Medical Tests and Procedures:Electrolyte and renal profileGlucose/endocrine profileNutrition-Focus Physical FindingsOverall findingsElectronically signed by Jason E White,March 30, 2023Please note: Nutrition is a consult only service on weekends and holidays.  Please enter a consult in EPIC if assistance is needed on a weekend or holiday or via MHB [phone] to contact the covering RD.

## 2022-03-20 NOTE — Plan of Care
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   General Observations Pt received supine in bed; on RA; NAD; B podus boots; PEG site w/ foley; ostomy; RN Cleared   Precautions/Limitations Fall Precautions;Bed alarm;Chair alarm   Precautions/Limitations Comment WBAT BLE    Weight Bearing Status  Weight Bearing Status Comments WBAT BLE    Vital Signs and Orthostatic Vital Signs  Vital Signs Vital Signs Stable   Vital Signs Free text RA; NAD    Pain/Comfort  Pain Comment (Pre/Post Treatment Pain) Pt verbalized pain to BLE w/ mobilization. Pt unquantified pain. RN aware and repositioned for comfort at end of session.    Patient Coping  Observed Emotional State accepting;anxious;cooperative   Verbalized Emotional State acceptance    Cognition  Overall Cognitive Status Impaired   Orientation Level Oriented to person   Level of Consciousness alert   Following Commands Follows one step commands with increased time;Follows one step commands with repetition   Personal Safety / Judgment Fall risk;Requires supervision with mobility    Range of Motion  Range of Motion Examination WFL except   Range of Motion Comments Decreased AROM to BLE d/t pain    Manual Muscle Testing  Manual Muscle Testing Comments + Deconditioned    Skin Assessment  Skin Assessment See Nursing Documentation    Balance  Sitting Balance: Static  POOR-?????Maximal assist to maintain static position with no Assistive Device   Sitting Balance: Dynamic  POOR-????Unable to move from midline voluntarily, maximal assist to right self   Balance Assist Device Hand held assist   Balance Skills Training Comment Ax2 seated EOB; retropulsive Bed Mobility  Supine-to-Sit Independence/Assistance Level Maximum assist;Assist of 2   Supine-to-Sit Assist Device Hand held assist;Head of bed elevated;Draw pad   Sit-to-Supine Independence/Assistance Level Maximum assist;Assist of 2   Sit-to-Supine Assist Device Hand held assist;Draw pad;Head of bed elevated   Bed Mobility Comments Sit EOB ~5 minutes; retropulsive max Ax2 to maintain upright    Sit-Stand Transfer Training  Sit-Stand Transfer Comments NT d/t pain, anxiety, and impaired motor function of BLE    Handoff Documentation  Handoff Patient in bed;Bed alarm;Patient instructed to call nursing for mobility;Discussed with nursing    Endurance  Endurance Comments Poor    PT- AM-PAC - Basic Mobility Screen- How much help from another person do you currently need.....  Turning from your back to your side while in a a flat bed without using rails? 1 - Total - Requires total assistance or cannot do it at all.   Moving from lying on your back to sitting on the side of a flat bed without using bed rails? 1 - Total - Requires total assistance or cannot do it at all.   Moving to and from a bed to a chair (including a wheelchair)? 1 - Total - Requires total assistance or cannot do it at all.   Standing up from a chair using your arms(e.g., wheelchair or bedside chair)? 1 - Total - Requires total assistance or cannot do it at all.   To walk in a hospital room? 1 - Total - Requires total assistance or cannot do it at all.   Climbing 3-5 steps with a railing? 1 - Total - Requires total  assistance or cannot do it at all.   AMPAC Mobility Score 6   TARGET Highest Level of Mobility Mobility Level 2, Turn self in bed/bed activity/dependent transfer    ACTUAL Highest Level of Mobility Mobility Level 2, Turn self in bed/bed activity/ dependent transfer    Therapeutic Exercise  Therapeutic Exercise Comments AROM x 4    Neuromuscular Re-education  Neuromuscular Re-education Comments Seated static/dynamic postural control training    Therapeutic Functional Activity  Therapeutic Functional Activity Comments Bed mobility; transfer training; Pt edu    Clinical Impression  Follow up Assessment Pt seen for PT f/u. Requires maxAx2 for bed mobility, limited by pain to BLE w/ mobilization/touch. Displays R lateral lean and retropulsive EOB. Able to tolerate seated EOB for ~5 minutes. Will benefit from skilled services to improve functional ROM and strength, static/dynamic postural control training, and activity tolerance. Current PT d/c rec is STR   Criteria for Skilled Therapeutic Interventions Met yes    Patient/Family Stated Goals  Patient/Family Stated Goal(s) feel better    Frequency/Equipment Recommendations  PT Frequency 2x per week   What day of week is next treatment expected? Friday   3/31  PT/PTA completing this assessment RL   Equipment Needs During Admission/Treatment No device    PT Recommendations for Inpatient Admission  Activity/Level of Assist mechanical lift   ADL Recommendations assist of 2    Planned Treatment / Interventions  Plan for Next Visit Progress as tol   Training Treatment / Interventions Balance / Gait Training;Endurance Training;Functional Personnel officer / Interventions Patient Education / Training    PT Discharge Summary  Physical Therapy Disposition Recommendation Short Term Rehab     Shanon Payor PT, [place]: [phone]

## 2022-03-20 NOTE — Progress Notes
Inpatient Occupational Therapy Progress NoteDefault Flowsheet Data (most recent)   IP Adult OT Eval/Treat - 03/20/22 1001    Date of Visit / Treatment  Date of Visit / Treatment 03/20/22   Note Type Progress Note   Progress Report Due 03/31/22   End Time 1001    General Information  Pertinent History Of Current Problem Per chart: 66 y.o. 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  Was deemed to lack capaciity, however required ethics consult as family difficult to contact for medical decision making, s/p PEG placement at that time.  Course further c/b b/l DVT, started on AC.  Now presented to [facility] for placement as family unable to care for her, found to be febrile/tachycardic   Precautions/Limitations Fall Precautions    Vital Signs and Orthostatic Vital Signs  Vital Signs Vital Signs Stable    Patient Coping  Observed Emotional State accepting    Cognition  Orientation Level Oriented to person   Level of Consciousness alert   Following Commands Follows one step commands without difficulty   Personal Safety / Judgment Fall risk    Musculoskeletal  LUE Muscle Strength Grading 2-->active movement with gravity eliminated   RUE Muscle Strength Grading 2-->active movement with gravity eliminated   LLE Muscle Strength Grading 2-->active movement with gravity eliminated   RLE Muscle Strength Grading 2-->active movement with gravity eliminated    Muscle Tone  Muscle Tone Testing Results No muscle tone deficits noted    Sensory Assessment  Sensory Tests Results No sensory impairment noted    Skin Assessment  Skin Assessment See Nursing Documentation    Balance  Sitting Balance: Static  POOR+    Minimal assist to maintain static position with no Assistive Device   Sitting Balance: Dynamic  POOR-    Unable to move from midline voluntarily, maximal assist to right self   Balance Assist Device Hand held assist     AM-PAC - Daily Activity IP Short Form  Help needed from another person putting on/taking off regular lower body clothing 2 - A Lot   Help needed from another person for bathing (incl. washing, rinsing, drying) 2 - A Lot   Help needed from another person for toileting (incl. using toilet, bedpan, urinal) 2 - A Lot   Help needed from another person putting on/taking off regular upper body clothing 2 - A Lot   Help needed from another person taking care of personal grooming such as brushing teeth 2 - A Lot   Help needed from another person eating meals 2 - A Lot   AM-PAC Daily Activity Raw Score (Total of rows above) 12   CMS Score (based on Raw Score - with G Code) 12 - 66.57% impaired      (G Code - CL)    Bed Mobility  Rolling/Turning Right - Independence/Assistance Level Moderate assist;Assist of 2   Rolling/Turning Left - Independence/Assistance Level Moderate assist    Handoff Documentation  Handoff Patient in bed    Endurance  Endurance Comments fair    Therapeutic Functional Activity  Therapeutic Functional Activity Detailed Documentation Developmental Handling    Clinical Impression  Follow up Assessment EOB activities with improved balance and activity tolerance as well as mental staus improvement   Impairments Found (describe specific impairments) attention;BADL's    Patient/Family Stated Goals  Patient/Family Stated  Goal(s) feel better    Frequency/Equipment Recommendations  OT Frequency 2x per week   What day of week is next treatment expected? Friday   3/31  OT/OTA completing this assessment GT    OT Discharge Summary  Disposition Recommendation Short Term Rehab

## 2022-03-20 NOTE — Plan of Care
Problem: Adult Inpatient Plan of CareGoal: Plan of Care ReviewFlowsheets (Taken 03/20/2022 0308 by Reva Bores, RN)Plan of Care Reviewed With: patient Plan of Care Overview/ Patient Status    0700-1900VSS on RA. Pt A&Ox2 disoriented to situation and place at times, confused. Pt is on dysphagia diet, pills crushed in apple sauce. She does not like the meds in apple sauce but will tolerate two spoonfuls max.Medication given per [facility]. Intake has been poor. Pt will tolerate liquids well. Pt pulled her PEG tube on Monday, Ir placed a foley to maintain the PEG site. Foley is draining all if not most of the PO intake. Dressing changed per MAR. PEG is scheduled to be replaced tomorrow. Fall prevention program is in place, bed in lowest position, call bell within reach, wheels locked, will continue to follow POC. Hourly rounding completed. Safety maintained.

## 2022-03-20 NOTE — Plan of Care
Plan of Care Overview/ Patient Status    AO to self, pleasant, thought she was in a hotel this evening. Incontinence care provided, barrier cream applied to excoriated buttocks. Pt is on a low air loss mattress.. Ileostomy draining green, slightly thickened fluid. Continues to pick at her skin and appliances. Redirecting Pt regularly. Pt did manage to peel away the top of her ileostomy bag. Replaced appliance, peristomal skin is intact. Peg site with foley as placeholder draining yellow fluid with sediment. Drinking a fair amount of fluid, continue to offer while Pt is awake.. Bed alarm on, Pt does not attempt to get OOB. Safety maintained, hourly rounding by staff.

## 2022-03-21 ENCOUNTER — Encounter
Admit: 2022-03-21 | Payer: PRIVATE HEALTH INSURANCE | Attending: Student in an Organized Health Care Education/Training Program | Primary: Internal Medicine

## 2022-03-21 MED ORDER — IOHEXOL 300 MG IODINE/ML INTRAVENOUS SOLUTION
300 mg iodine/mL | Status: DC | PRN
Start: 2022-03-21 — End: 2022-03-21
  Administered 2022-03-21: 21:00:00 300 mg iodine/mL via GASTROSTOMY

## 2022-03-21 MED ORDER — MIDAZOLAM 1 MG/ML INJECTION SOLUTION
1 mg/mL | Status: CP
Start: 2022-03-21 — End: ?

## 2022-03-21 MED ORDER — NYSTATIN 100,000 UNIT/GRAM TOPICAL POWDER
100000 unit/gram | Freq: Two times a day (BID) | TOPICAL | Status: DC
Start: 2022-03-21 — End: 2022-08-15
  Administered 2022-03-22 – 2022-08-14 (×219): via TOPICAL

## 2022-03-21 MED ORDER — FENTANYL (PF) 50 MCG/ML INJECTION SOLUTION
50 mcg/mL | Status: CP
Start: 2022-03-21 — End: ?

## 2022-03-21 NOTE — Progress Notes
[facility] Progress NoteAttending Provider: Henriette Combs, MDLOS: 38CC: encephalopathy                                                                          Subjective: Interim History: Denied any pain todayShe is calmPleasantly confusedAware of plan for PEG tube replacementReview of Allergies/Meds/Hx: Review of Allergies/Meds/Hx:I have reviewed the patient's: allergies, current scheduled medications, current infusions, and current prn medicationsScheduled Meds:[Held by provider] apixaban, 5 mg, Q12Hbanana flakes-t-galactooligos., 1 packet, BIDchlorhexidine gluconate, 15 mL, BIDElemental iron, 45 mg, BIDenoxaparin (LOVENOX) treatment, 80 mg, Q12Hfamotidine, 20 mg, Dailyfolic acid, 1 mg, Dailyniacin, 100 mg, Nightlypyridoxine (vitamin B6), 100 mg, Daily[Held by provider] rosuvastatin, 10 mg, Nightlysodium chloride, 3 mL, Q8Hthiamine, 100 mg, BIDContinuous Infusions:PRN Meds:.acetaminophen, dextrose (GLUCOSE) oral liquid 15 g **OR** fruit juice **OR** skim milk, dextrose (GLUCOSE) oral liquid 30 g **OR** fruit juice, dextrose injection, dextrose injection, glucagon, ondansetron **OR** ondansetron (ZOFRAN) IV Push, sodium chlorideObjective: Vitals:I have reviewed the patient's current vital signs as documented in the patient's EMR.   and Last 24 hours: Temp:  [97.8 ?F (36.6 ?C)-98.6 ?F (37 ?C)] 98.4 ?F (36.9 ?C)Pulse:  [74-84] 80Resp:  [18-20] 18BP: (111-137)/(67-95) 117/78SpO2:  [98 %-100 %] 98 %I/O's:I have reviewed the patient's current I&O's as documented in the EMR.Gross Totals (Last 24 hours) at 03/21/2022 0936Last data filed at 03/21/2022 0200Intake -- Output 975 ml Net -975 ml Procedures:None Physical Exam General: no acute distress, awake, alert & oriented x1Head: atraumatic, normocephalicEyes: EOMI, PERRLA, sclerae anictericNeck: supple, no LADCV: S1, S2, RRR, no R/G/MResp: CTA B/L; no w/r/r/, normal effort, symmetric air entryAbd: bs +, soft, NT/ND, no rebound tenderness or guarding; LLQ ostomy site c/d/i; PEG tube removed and foley in placeExtrem: no pedal edema, w/w/p, no c/c/eSkin: warm, dry, no obvious rashes or lesionsLabs:I have reviewed the patient's labs within the last 24 hrs. Significant findings are see below.Last 24 hours: No results found for this or any previous visit (from the past 24 hour(s)).Diagnostics:I have reviewed the patient's Radiology report(s) within the last 48 hrs. Significant findings are see below.XR Chest PA or AP (Portable) Final Result    No consolidations to suggest pneumonia.  Reported And Signed By: Leilani Merl, MD    [facility] Radiology and Biomedical Imaging   XR Foot Bilateral AP Lateral and Oblique Final Result  1. Diffuse degenerative changes without evidence of acute osseous injury. There is no evidence of bony erosion.  Reported And Signed By: Merdis Delay, MD    [facility] Radiology and Biomedical Imaging   MRI Brain w wo IV Contrast Final Result  1. 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 2. Redemonstration microvascular disease no new new territorial restricted diffusion hemorrhage or midline shift. 3.  Motion limits assessment of the heterogeneous maxillary alveolus correlate with dental inspection Report Initiated By:  Neta Mends, MD  Reported And Signed By: Lynett Fish, MD    [facility] Radiology and Biomedical Imaging   MRI Total Spine w wo IV Contrast Final Result  1. Scoliotic deformity of the thoracolumbar spine. 2. 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 3. No obvious epidural abscess or paraspinal collection.  No findings suspicious for discitis/osteomyelitis. Mild perifacet inflammatory changes identified at L4-5, likely degenerative. Mild increased T2 signal and enhancement within the posterior paraspinal muscles diffusely throughout the lumbar spine could be related to denervation or inflammation.  Examination is significantly degraded by patient motion and sagittal T2 signal changes within the spinal cord or abnormal intrathecal enhancement may not be apparent on this examination. Examination to be repeated with sedation if clinically indicated.  Report Initiated By:  Neta Mends, MD  Reported And Signed By: Aviva Kluver, MD    [facility] Radiology and Biomedical Imaging   XR Hips Bilateral AP and Lateral with AP Pelvis Final Result  US Abdomen Pelvis Vascular Study Complete ([facility]) Final Result    No evidence of portal vein or hepatic vein thrombosis.  Increased liver echogenicity, which is most commonly noted with hepatic steatosis. However, please note that more advanced forms of liver disease, including hepatic fibrosis/early cirrhosis may have a similar imaging appearance.   [place] Radiology Notify System Classification: Routine  Report Initiated By:  Kreg Shropshire, MD  Reported And Signed By: Verl Bangs, MD    [facility] Radiology and Biomedical Imaging   Hospitalist Procedure [facility]) Final Result  Hospitalist Procedure [facility]) Final Result  [place] Head wo IV Contrast Final Result Since the prior exam, there has been a significant increase in ventricular size and sylvian fissures. In the appropriate clinical setting normal pressure hydrocephalus can be considered.  Report Initiated By:  Consuela Mimes, MD  Reported And Signed By: Cherre Blanc, MD    [facility] Radiology and Biomedical Imaging   [place] Chest wo IV Contrast Final Result  Patchy groundglass opacities in bilateral lower lobes with 4 mm right lower lobe lung nodule, findings may represent infectious/inflammatory etiology in a proper clinical setting. Short-term follow-up can be obtained for resolution. Otherwise consider follow-up chest [place] in 12 months for stability.  Reported And Signed By: Antonieta Loveless, MD    [facility] Radiology and Biomedical Imaging   [place] Abdomen Pelvis w IV Contrast Final Result No collection in abdomen or pelvis.   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 Correlation with prior imaging is recommended.    Reported And Signed By: Madie Reno, MD    [facility] Radiology and Biomedical Imaging   CXR (portable) Final Result   No acute cardiothoracic abnormality.  Report Initiated By:  Tessie Eke, RRA  Reported And Signed By: Alberteen Sam, MD    [facility] Radiology and Biomedical Imaging   [place] RESULT SCAN Final Result  [place] RESULT SCAN Final Result  Outside Interpretation [place] Abdomen and/or Pelvis Final Result  [place] RESULT SCAN Final Result  XRAY RESULT SCAN Final Result  Outside Interpretation MRI Neuro Final Result   Only motion degraded T1 images are available for review which significantly limits evaluation. Mild ventriculomegaly. No focal mass or intracranial hemorrhage is identified.    Report Initiated By:  Renato Gails, MD  Reported And Signed By: Vicki Mallet, MD    [facility] Radiology and Biomedical Imaging   MRI RESULT SCAN Final Result  Outside Interpretation MRI Neuro Final Result Multilevel degenerative changes as described above. Report Initiated By:  Renato Gails, MD  Reported And Signed By: Vicki Mallet, MD    [facility] Radiology and Biomedical Imaging   Outside Interpretation MRI Neuro Final Result   Apparent multiple foci of T2 hyperintensity in the thoracic cord throughout its length including at the level of the conus in the setting of motion artifact. It is unclear  if these are artifactual or relate to lesions within the cord. Repeat T2 axial and T2 sagittal images of the thoracic spine are recommended.   Levoscoliotic curvature of the thoracic spine with no significant spinal canal stenosis or neural foraminal narrowing.   Report Initiated By:  Renato Gails, MD  Reported And Signed By: Vicki Mallet, MD    [facility] Radiology and Biomedical Imaging   MRI RESULT SCAN Final Result  [facility] MRI Cervical Spine Final Result  Outside Interpretation [place] Abdomen and/or Pelvis Final Result  [place] RESULT SCAN Final Result  Outside Interpretation [place] Abdomen and/or Pelvis Final Result  [place] RESULT SCAN Final Result  ED Procedural [place] Peripheral Venous Access    (Results Pending) IR Gastrointestinal procedures    (Results Pending) ECG/Tele Events: I have reviewed the patient's ECG as resulted in the EMR.Assessment: A 66 Y O F PMHx of RA, recent prolonged hospitalization for perforated diverticulitis (s/p ex Lap/R hemicolectomy with end ileostomy). Admitted 2/24 for LTACH placement and persistent encephalopathy. Found to be febrile without clear infectious source found, fevers resolved. Extensive work-up of encephalopathy and leg weakness unremarkable. Course c/b elevated LFTs which resolved, likely 2/2 DILI from Ceftriaxone or Zosyn. Stable and awaiting LTC placement, but with ongoing significant hypercalcemia.?Mental status appears overall stable/improving.Plan: # PEG tube dislodgement - accidentally dislodged- IR to evaluate- PEG needs replacement - Friday 3/31- TF held for now- Poor po intake over all approx 25% will need to resume TF after # Dysphagia -- Was On TF + FWF- SLP consulted--for re-evaluation - 3/27 OK for puree and thin liquids- Re-eval for diet upgrade later- Calorie count- Nutrition consult- Requires 1: 1 feeding assistance- Aspiration precautions# Recurrent Fever/R-foot Cellulitis/Eschar - transient fever resolved, no leukocytosis. No clear source identified, possibly 2/2 DVT vs autoimmune. CXR neg, UA neg. CRP 25.8, procal neg. Xray feet with degenerative changes but no acute findings - Suspected source was R foot cellulitis/eschar- S/P CTX 2/21 and Zosyn 2/21-2/25; S/P Empiric ABX-Oxacillin --stopped with negative infectious work-up- Consulted Podiatry--appreciate recs- do not think that foot wound is the cause of the fevers- WBAT to BLE.No?indication for podiatric surgical intervention at this time. ?- Patient may follow-up with Dr. Gazes within 3-4 weeks of discharge. - Trend fever/CBC- Tylenol prn# Acute encephalopathy - # LE weakness -- Unclear etiology, possibly prolonged delirium iso hospitalization from Nov 2022 - currentMRI brain, MRI total spine unremarkable- LP unremarkable- Paraneoplastic and autoimmune encephalopathy panels negative- Overall somewhat improved cognitive status during admission, remains alert and oriented x 1-2- Neurology rec'd EMG to complete work-up of LE weakness but pt declined; Neurology does not recommend any further testing and signed off 3/8/23RESOLVED:# Hypercalcemia - acute on?chronic--resolvedPTH normal (28); work-up including PTH-rP (9), vitamin D testing, TSH, random cortisol unrevealing. Repeat PTH 33.6, am Cortisol 8.9. Fasting CTx (Collagen Type-1 Telopeptide)-elevated- Possibly d/t immobility or primary hyperparathyroidism; sent additional PTH testing with mineral metabolism level (send-out test)- Endocrine consult  recs: possible additional therapy pending PTH results. - S/P IVF 100-- --will dc- Continue FWF via G-tube 400 ml q6H- S/P Zolendronic acid 4 mg X1 (3/21)- Trend Ca and Albumin q3d while IP?# Elevated LFTs -- Resolved- [place] A/P w/ multiple peritoneal nodules- LFTs normal on admission but new elevation after 3d of admission- Work-up showed elevated IgG, smooth muscle antibodies c/f possible autoimmune hepatitisInitial concern that imaging showed peritoneal nodules ?carcinomatosis- On review of imaging, nodules improving, more likely to be continued resolution of debris from perforation- Liver tests resolved without intervention, so most likely DILI from antibiotics (  ceftriaxone and Zosyn)- Check LFTs weekly to ensure she is not evolving autoimmune hepatitis given multiple positive autoimmune markers?# Recurrent Hypokalemia - - Appears c/w GI losses with watery ileostomy stool--Banatrol added- Replaced via G-tube--will add daily dosing- Trend UOP/BMP/Cr prn?CHRONIC:# Hx Bilateral DVT:- Apixaban 5mg  BID-HELD- Continue Lovenox 80 mg q12H?# Iron deficiency anemia - chronic, hgb now stable# Elevated FLC ratio - -Hematology consulted & signed off - elevated FLC ratio felt to be reactive, plasma cell disorder unlikely ?# Rheumatoid Arthritis - has been off of MTX since November/acute illness, rheumatology signing off current ams/weakness not felt to be related to her RA, follow up outpatient -Bilateral LE DVT - ?provoked from recent surgery, cont lovenox, can resume apixaban at discharge?FEN: puree and thin liquidsDVT ppx: lovenoxCODE: FULLDispo: medical - needs PEG tube replacedpending sister applying for conservatorship; T19 pendingMedical decision making for this patient was moderate.I spent 35 minutes today on this encounter before, during and after the visit, reviewing labs and records, evaluating and examining the patient, entering orders and documenting the visit. Electronically Signed:Nicloe Frontera Fortunato Curling, MD, PhDDepartment of Internal MedicineYale Hospitalist ServicesAvailable on MHB3/31/20233:14 PM

## 2022-03-21 NOTE — Plan of Care
Inpatient Physical Therapy Progress Note IP Adult PT Eval/Treat - 03/21/22 0908    Date of Visit / Treatment  Date of Visit / Treatment 03/21/22   Note Type Progress Note   Progress Report Due 03/31/22   End Time 0908    General Information  Subjective Hey, handsome.   General Observations Pt received supine in bed; on RA; NAD; foley at PEG site; B podus boots; RN cleared   Precautions/Limitations Fall Precautions;Bed alarm;Chair alarm   Precautions/Limitations Comment WBAT BLE    Weight Bearing Status  Weight Bearing Status Comments WBAT BLE    Vital Signs and Orthostatic Vital Signs  Vital Signs Vital Signs Stable   Vital Signs Free text RA; NAD    Pain/Comfort  Pain Comment (Pre/Post Treatment Pain) Pt verbalizes unquantified pain to BLE w/ touch and movement; RN aware and repositioned for comfort at end of session.    Patient Coping  Observed Emotional State accepting;anxious;cooperative   Verbalized Emotional State acceptance    Cognition  Overall Cognitive Status Impaired   Orientation Level Oriented to person   Level of Consciousness alert   Following Commands Follows one step commands with increased time;Follows one step commands with repetition   Personal Safety / Judgment Fall risk;Requires supervision with mobility   Cognition Comments Easily distracted; requires redirection    Range of Motion  Range of Motion Examination WFL except   Range of Motion Comments Tight B HM and heel cords    Manual Muscle Testing  Manual Muscle Testing Comments + Deconditioned    Skin Assessment  Skin Assessment See Nursing Documentation    Balance  Sitting Balance: Static  POOR-     Maximal assist to maintain static position with no Assistive Device   Sitting Balance: Dynamic  POOR-    Unable to move from midline voluntarily, maximal assist to right self   Standing Balance: Static POOR-     Maximal assist to maintain static position with no Assistive Device   Standing Balance: Dynamic  POOR-    Unable to move from midline voluntarily, maximal assist to right self   Balance Assist Device Hand held assist   Balance Skills Training Comment Ax3    Bed Mobility  Supine-to-Sit Independence/Assistance Level Maximum assist;Assist of 2   Supine-to-Sit Assist Device Hand held assist;Head of bed elevated;Draw pad   Sit-to-Supine Independence/Assistance Level Maximum assist;Assist of 2   Sit-to-Supine Assist Device Hand held assist;Head of bed elevated   Bed Mobility Comments Retropulsive and R lateral lean EOB; unable to right self despite max VCs.    Sit-Stand Transfer Training  Sit-to-Stand Transfer Independence/Assistance Level Maximum assist   Ax3  Sit-to-Stand Transfer Assist Device Hand held assist   Stand-to-Sit Transfer Independence/Assistance Level Maximum assist;Assist of 2   Stand-to-Sit Transfer Assist Device Hand held assist   Sit-Stand Transfer Comments Ax3 to BUE and block to BLE; unable to achieve full upright d/t pain and tight B HM and heel cords. Only performed 1 attempt.   Stand-Sit Transfer Comments Poor eccentric control    Gait Training  Gait Training Comments NT d/t increased need for assist    Handoff Documentation  Handoff Patient in bed;Bed alarm;Patient instructed to call nursing for mobility;Discussed with nursing    Endurance  Endurance Comments Fair-    PT- AM-PAC - Basic Mobility Screen- How much help from another person do you currently need.....  Turning from your back to your side while in a a flat bed without using rails? 1 -  Total - Requires total assistance or cannot do it at all.   Moving from lying on your back to sitting on the side of a flat bed without using bed rails? 1 - Total - Requires total assistance or cannot do it at all.   Moving to and from a bed to a chair (including a wheelchair)? 1 - Total - Requires total assistance or cannot do it at all.   Standing up from a chair using your arms(e.g., wheelchair or bedside chair)? 1 - Total - Requires total assistance or cannot do it at all.   To walk in a hospital room? 1 - Total - Requires total assistance or cannot do it at all.   Climbing 3-5 steps with a railing? 1 - Total - Requires total assistance or cannot do it at all.   AMPAC Mobility Score 6   TARGET Highest Level of Mobility Mobility Level 2, Turn self in bed/bed activity/dependent transfer    ACTUAL Highest Level of Mobility Mobility Level 3, Sit on edge of bed    Therapeutic Exercise  Therapeutic Exercise Comments AROM x 4    Neuromuscular Re-education  Neuromuscular Re-education Comments Seated postural control training    Therapeutic Functional Activity  Therapeutic Functional Activity Comments Bed mobility; transfer training; Pt edu    Clinical Impression  Follow up Assessment Pt seen for PT f/u. Continues to require mod/maxAx2 for bed mobility to EOB. Retropulsive w/ R lateral lean seated EOB requiring max A to right Pt; unable to right self. Performed 1 STS from EOB w/ Ax3; unable to achieve full upright d/t LE tightness and pain. Will benefit from skilled services to improve functional ROM/strength, static/dynamic postural control, and activity tolerance. Current PT d/c rec is STR.   Criteria for Skilled Therapeutic Interventions Met yes    Patient/Family Stated Goals  Patient/Family Stated Goal(s) feel better    Frequency/Equipment Recommendations  PT Frequency 2x per week   What day of week is next treatment expected? Monday   4/3  PT/PTA completing this assessment RL   Equipment Needs During Admission/Treatment No device    PT Recommendations for Inpatient Admission  Activity/Level of Assist mechanical lift   ADL Recommendations assist of 2    Planned Treatment / Interventions  Plan for Next Visit Prog as tol'   Training Treatment / Interventions Balance / Investment banker, operational;Endurance Dispensing optician / Interventions Patient Education / Training    PT Discharge Summary  Physical Therapy Disposition Recommendation Short Term Rehab     Shanon Payor PT, [place]: [phone]

## 2022-03-21 NOTE — Progress Notes
Inpatient Occupational Therapy Progress NoteDefault Flowsheet Data (most recent)   IP Adult OT Eval/Treat - 03/21/22 0901    Date of Visit / Treatment  Date of Visit / Treatment 03/21/22   Note Type Progress Note   Progress Report Due 04/01/22   End Time 0901    General Information  Pertinent History Of Current Problem Per chart: 66 y.o. 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  Was deemed to lack capaciity, however required ethics consult as family difficult to contact for medical decision making, s/p PEG placement at that time.  Course further c/b b/l DVT, started on AC.  Now presented to [facility] for [facility] as family unable to care for her, found to be febrile/tachycardic   Precautions/Limitations Fall Precautions    Vital Signs and Orthostatic Vital Signs  Vital Signs Vital Signs Stable    Pain/Comfort  Location #1 - PreTreatment Rating (Numbers Scale) 0/10 - no pain   Posttreatment Rating (Numbers Scale) 0/10 - no pain    Patient Coping  Observed Emotional State accepting    Cognition  Overall Cognitive Status Impaired   Orientation Level Oriented to person;Oriented to place   Level of Consciousness confused   Following Commands Follows one step commands with increased time   Personal Safety / Judgment Requires cuing/assistance to correct errors made;Decreased awareness of need for assistance;Decreased recognition / insight of own deficits   Short Term Memory Requires redirection   Long Term Memory Requires redirection   Attention Sustained impairment   Problem Solving Simple impairment    Musculoskeletal  LUE Muscle Strength Grading 2-->active movement with gravity eliminated   RUE Muscle Strength Grading 2-->active movement with gravity eliminated   LLE Muscle Strength Grading 2-->active movement with gravity eliminated   RLE Muscle Strength Grading 2-->active movement with gravity eliminated    Muscle Tone  Muscle Tone Testing Results No muscle tone deficits noted    Coordination  Opposition Impaired bilaterally    Sensory Assessment  Sensory Tests Results No sensory impairment noted    Skin Assessment  Skin Assessment See Nursing Documentation    Balance  Sitting Balance: Static  POOR-     Maximal assist to maintain static position with no Assistive Device   Sitting Balance: Dynamic  POOR-    Unable to move from midline voluntarily, maximal assist to right self   Standing Balance: Static POOR-     Maximal assist to maintain static position with no Assistive Device   Standing Balance: Dynamic  POOR-    Unable to move from midline voluntarily, maximal assist to right self     AM-PAC - Daily Activity IP Short Form  Help needed from another person putting on/taking off regular lower body clothing 2 - A Lot   Help needed from another person for bathing (incl. washing, rinsing, drying) 2 - A Lot   Help needed from another person for toileting (incl. using toilet, bedpan, urinal) 2 - A Lot   Help needed from another person putting on/taking off regular upper body clothing 2 - A Lot   Help needed from another person taking care of personal grooming such as brushing teeth 2 - A Lot   Help needed from another person eating meals 2 - A Lot   AM-PAC Daily Activity Raw Score (Total of rows above) 12   CMS Score (  based on Raw Score - with G Code) 12 - 66.57% impaired      (G Code - CL)    Bed Mobility  Rolling/Turning Right - Independence/Assistance Level Moderate assist   Rolling/Turning Left - Independence/Assistance Level Moderate assist   Supine-to-Sit Independence/Assistance Level Maximum assist   Supine-to-Sit Assist Device Hand held assist Sit-Stand Transfer Training  Sit-to-Stand Transfer Independence/Assistance Level Maximum assist   Sit-to-Stand Transfer Assist Device Hand held assist   Stand-to-Sit Transfer Independence/Assistance Level Maximum assist   Stand-to-Sit Transfer Assist Device Hand held assist    Endurance  Endurance Comments fair-    Therapeutic Functional Activity  Therapeutic Functional Activity Detailed Documentation Developmental Handling    Clinical Impression  Initial Assessment Pt seen today for initial OT evaluation. Pt completing bed level evaluation limited significantly by decreased arousal, command following, and discomfort w/ ROM of BLE. Pt dependent w/ ADLs. Pt would benefit from continued OT services. Pending medical progress, rec d/c to STR to maximize rehab potential.    Frequency/Equipment Recommendations  OT Frequency 2x per week   What day of week is next treatment expected? Monday   4/3  OT/OTA completing this assessment GT    OT Discharge Summary  Disposition Recommendation Short Term Rehab

## 2022-03-21 NOTE — Other
HVC HANDOFF REPORTTo view medication administration, contrast administration,vital signs, and procedure details:Procedure documented in: Non-Epic: see Intra Procedure Log: (To access Intra Procedure Log: go to chart review - media tab - look for document type: Intra Procedure Log).Procedural Nurse/Contact Number: Elijah Birk, RN     Performing Provider/Contact Number: Drucie Opitz, MDProcedure performed: gtube replacementAdditional comments: Was procedure done with anesthesia Support? No Last Vital Signs:Heart Rate: 102B/P: 151/87Respiratory Rate: 22  O2 Sat.: 98%Pain Level/ Intervention: treated PRNLOC and/or current Mental Status: unchangedWas procedure performed with moderate sedation: NoPatient transferring on Oxygen: NoProcedural Access Site: LUQ - CDI See Epic documentation for LDAsElectronically signed:Deandre Stansel Chilton Si, RN 05/17/2021, 8:37 AM

## 2022-03-21 NOTE — Plan of Care
Plan of Care Overview/ Patient Status    AO to self, pleasant,  restless,anxious, continually pulling her gown off, hands are fidgety reaching, pulling at appliances, continual reorientation provided. Pt reported hallucinations, said she was seeing rats. Paged MD 1X dose melatonin administered with little effect. Poor sleep overnight, Pt's roommate required care with staff constantly in and out of Pt's room.  All the activity caused increased stress/confusion and poor sleep. 0400 and Pt has finally fallen asleep. Incontinence care provided, barrier cream applied to excoriation on buttocks. Safety maintained, hourly rounding by staff.950 ml yellow fluid with sediment out to foley bag draining from PEG site.

## 2022-03-21 NOTE — Procedures
[facility] NotePatient name: Danielle [name] MRN: [name] Date of procedure: 3/31/2023Procedure(s):Replacement of gastrostomy feeding tube.Sedation method: NonePre Procedure Diagnosis: Dislodged gastrostomy tube. An 18FR foley was placed in the tract.Post Procedure Diagnosis: SameIndications: Dislodged gastrostomy tube. Primary Attending: Dr. Brunetta Jeans MDAssistant: Loree Fee, MDAnesthesia: NoneSpecimen(s) removed: NoneAdditional studies ordered: NoneDrains: NoneEstimated Blood Loss: MinimalComplications: NoneVascular closure method: N/A. Findings/Notes/Comments: - Successful gastrostomy replacement using a MIC 24 French gastrostomy tube. Plan: - The tube is ready for immediate use. - The tube should be flushed twice daily with 15 cc Tap water.Verbal Orders and Supervisory Attestation: As the procedural physician, I verify I was present throughout the entire procedure and provided physician orders as documented in the specialty electronic documentation system. Furthermore I directly supervised all non-physicians providers who assisted me duing the procedure and I accept delegation of supervisory responsibility for all post procedure patient care activity related to this patient that I request from a [facility] PA/APRN/staff member.Loree Fee, MD3/31/2023

## 2022-03-22 NOTE — Plan of Care
Plan of Care Overview/ Patient Status    A/Ox1, self only. VSS, RA, no tele, bedrest- assist x 1-2. Pt is incontinent of urine, pad changes, full bed bath, linen and gown change. Pt has iliostomy, drained of green liquid output. Pt has mucous drain, 0 output. Pt was NPO for PEG replacement, resumed pureed diet post procedure. Pt is in supportive boots for alignment and protection, bil heel (old) wounds OTA, otherwise, skin intact. Safety maintained. Problem: Adult Inpatient Plan of CareGoal: Plan of Care ReviewOutcome: Interventions implemented as appropriate

## 2022-03-22 NOTE — Plan of Care
Plan of Care Overview/ Patient Status    0700-1500Patient is A&Ox1, oriented to person. VSS on RA. Complains of bilateral leg pain w/ movement, tylenol administered and legs positioned. Pills crushed with pudding. Very poor PO intake, bolus tube feeds per order.  Ostomy in place. AX2 T&R q2hr. Podus boots on. See flow sheet for skin, dressings changed. Safety checks and hpurly rounding performed, see flow sheet and MAR for details. Problem: Adult Inpatient Plan of CareGoal: Plan of Care ReviewOutcome: Interventions implemented as appropriateGoal: Patient-Specific Goal (Individualized)Outcome: Interventions implemented as appropriateGoal: Absence of Hospital-Acquired Illness or InjuryOutcome: Interventions implemented as appropriateGoal: Optimal Comfort and WellbeingOutcome: Interventions implemented as appropriateGoal: Readiness for Transition of CareOutcome: Interventions implemented as appropriate Problem: InfectionGoal: Absence of Infection Signs and SymptomsOutcome: Interventions implemented as appropriate Problem: Physical Therapy GoalsGoal: Physical Therapy GoalsDescription: PT GOALS - addressed 3/13/231. Patient will demonstrate active range of motion x 4 extremities in preparation for mobility2. Caregivers will be independent and safely implement a range of motion/positioning program to prevent loss of range of motion and skin integrity3. Patient will perform bed mobility with maximal assist4. Patient will tolerate sitting edge of bed >5 minutes with minimal assist5. Patient will perform transfers with moderate assist of 2 using rolling walker  Outcome: Interventions implemented as appropriate Problem: Occupational Therapy GoalsGoal: Occupational Therapy GoalsDescription: OT goals1. Pt will move to EOB for seated ADL mod I2. Pt will follow 3/4 simple one step motor commands IND3. Pt will sequence through set up ADL task INDOutcome: Interventions implemented as appropriate Problem: Impaired Wound HealingGoal: Optimal Wound HealingOutcome: Interventions implemented as appropriate Problem: Fall Injury RiskGoal: Absence of Fall and Fall-Related InjuryOutcome: Interventions implemented as appropriate Problem: Violence Risk or ActualGoal: Anger and Impulse ControlOutcome: Interventions implemented as appropriate Problem: Skin Injury Risk IncreasedGoal: Skin Health and IntegrityOutcome: Interventions implemented as appropriate Problem: Mobility ImpairmentGoal: Optimal MobilityOutcome: Interventions implemented as appropriate Problem: Aspiration (Enteral Nutrition)Goal: Absence of Aspiration Signs and SymptomsOutcome: Interventions implemented as appropriate Problem: Device-Related Complication Risk (Enteral Nutrition)Goal: Safe, Effective Therapy DeliveryOutcome: Interventions implemented as appropriate Problem: Confusion AcuteGoal: Optimal Cognitive FunctionOutcome: Interventions implemented as appropriate Problem: Pain AcuteGoal: Acceptable Pain Control and Functional AbilityOutcome: Interventions implemented as appropriate Problem: Speech Language Pathology GoalsGoal: Dysphagia Goals, SLPDescription: Pt will tolerate pureed diet with thin liquids w/o s/x of aspiration or adverse impact on respiratory status.Pt will participate in clinical upgrade of solids w/o s/sx of aspiration or adverse impact on respiratory status. Outcome: Interventions implemented as appropriate

## 2022-03-22 NOTE — Plan of Care
Plan of Care Overview/ Patient Status    1530-1900A&O: to self Resp: room airGI/GU: ileostomy, incontinent of urine Diet: TF bolus with tray. 1:1 feedPain: none stated Mobility: total Pills: crushed via PEGSafety maintained, call bell in reach, bed alarm set, rounding continued.

## 2022-03-22 NOTE — Progress Notes
[facility]	 [facility]	Medicine Progress NoteHospitalist ServiceAttending Provider: Seabron Spates, MD [phone]:  9822/9822-AHospital Day #39 Subjective: Interim History: Offers no complaints, denies pain. Nursing without concernReview of Allergies/Meds/Hx: I have reviewed the patient's: current scheduled medicationsAbx/Antifungal: NoneFluids/Drips: NoneObjective: Vitals:Temp:  [97.2 ?F (36.2 ?C)-98.8 ?F (37.1 ?C)] 97.5 ?F (36.4 ?C)Pulse:  [81-107] 81Resp:  [16-19] 16BP: (114-137)/(76-89) 121/79SpO2:  [96 %-100 %] 97 %Device (Oxygen Therapy): room airI/O's:I/O last 3 completed shifts:In: - Out: 400 [Stool:400]No intake/output data recorded.Physical Exam General: Alert and oriented x2 (knows she is in the hospital, self), thin, pleasantly confused, sitting upright in bed in NADHEENT:  MMs Moist, OP clear.Neck: No JVPPulm: CTABCardiac:  RRR, S1S2, no r/m/gGI:  Soft, N/T, N/D, BS normoactive ostomy bag with liq dark stool, PEG site c/d/iGU:  No foleyVascular/EXT:  No LE edema bilaterallySkin:  No rashes notedPsych:  NL AffectNeuro:  Non focalAccess:  I reviewed lab results in formulating this patient's plan of care. Labs:  Last 24 hours: No results found for this or any previous visit (from the past 24 hour(s)).Culture Data: 3/24 MRSA +Diagnostics: Imaging results last 1 week:  No results found.ECG/Tele Events: No ECG ordered todayAssessment: 66 y.o. female PMHx of RA, recent prolonged hospitalization for perforated diverticulitis (s/p ex Lap/R hemicolectomy with end ileostomy) admitted for persistent encephalopathy/FTT and likely need for LTACH with work up revealing fever with unclear etiology (resolved). Has undergone extensive work-up of encephalopathy and leg weakness which is unremarkable. Course c/b elevated LFTs which resolved, likely 2/2 DILI from Ceftriaxone or Zosyn. Mental status appears overall stable.Plan: DISLODGED PEG- PEG replacement by IR  3/31- TF resumed so far tolerating- Poor po intake over all approx 25% - cleared by SLP for pureed diet and thin liqACUTE ENCEPHALOPATHY/LE WEAKNESS- MS improved from chart review- Unclear etiology, possibly prolonged delirium iso hospitalization from Nov 2022 - currentMRI brain, MRI total spine unremarkable- LP unremarkable- Paraneoplastic and autoimmune encephalopathy panels negative- Neurology rec'd EMG to complete work-up of LE weakness but pt declined; Neurology does not recommend any further testing and signed off 02/26/22?RESOLVED FEVER /R FOOT CELLULITIS- transient fever resolved, no leukocytosis - CXR neg, UA neg. CRP 25.8, procal neg. Xray feet with degenerative changes but no acute findings - Suspected source was R foot cellulitis- S/P CTX 2/21 and Zosyn 2/21-2/25; S/P Empiric ABX-Oxacillin --stopped with negative infectious work-up- Consulted Podiatry--appreciate recs; do not think that foot wound is the cause of the fevers- WBAT to BLE.No?indication for podiatric surgical intervention at this time. ?- Patient may follow-up with Dr. Gazes within 3-4 weeks of discharge.?- Trend fever/CBC- Tylenol prnRESOLVED HYPERCALCEMIA- acute on?chronic now resolved- Possibly d/t immobility or primary hyperparathyroidism- PTH normal;?work-up including PTH-rP (9), vitamin D testing, TSH, random cortisol unrevealing. Repeat PTH 33.6, am Cortisol 8.9. Fasting CTx (Collagen Type-1 Telopeptide)-elevated- sent additional PTH testing with mineral metabolism level (send-out test)- Endocrine consult ?recs: possible additional therapy pending PTH results. - Continue FWF via G-tube 400 ml q6H- S/P Zolendronic acid 4 mg X1 (3/21)- Trend Ca and Albumin q3d while IPRESOLVED LFT ELEVATION- attributed to DILI from CFTX/ZOsynHx DVT- cont lovenox 80 mg bidHx IDA- H/H stable RA- off MTX since November d/t acute illness- fu rheum o/pDYSPHAGIA/FEN- SLP eval 3/27 OK for puree and thin liquids- Re-eval for diet upgrade later- resume TFs- FWF- Calorie counts- Requires 1: 1 feeding assistance- Aspiration precautionsMedication reconciliation complete DVT Prophylaxis lovenox PCP  Emergency contact  D/C Status Medically ready  Disposition Sister applied for conservatorship ; T19 pending then LT   > were  spent today on this encounter before, during, and after the visit, reviewing labs and records, evaluating and examining the patient, entering orders and documenting the visit.Medical decision making for this patient was moderate.  Full CodeSigned:JANELLE PACINI, PA  Hospitalist Medicine available on [facility] Attending addendum Patient seen and examined at bedside.York Spaniel they are planning for surgery on my stomachNot agitated/AO x2.Accidental PEG tube dislodgement-replaced.  On modified p.o. diet.Awaiting title 19 placement/conservatorship/long-term care.

## 2022-03-22 NOTE — Plan of Care
Plan of Care Overview/ Patient Status    Temp:  [97.2 ?F (36.2 ?C)-98.8 ?F (37.1 ?C)] 97.2 ?F (36.2 ?C)Pulse:  [80-107] 88Resp:  [18-19] 18BP: (114-137)/(76-89) 118/77SpO2:  [96 %-100 %] 96 %Device (Oxygen Therapy): room air1900 -0700 Neuro: A&Ox 1Resp: WDL on room airCardiac:WDL, not on tele GI/GU: ileostomy L & R, G tube, patent, flowing to gravity.MS:Ax2, bedfastSkin:See flowsheetsDiet: Pureed, thin liquids, pills via g tubePain: None indicated/reportedScheduled medications were administered per MAR, tolerated well. Hourly rounds and q2 hr turn and repositioning performed. Safety maintained, call bell within reach bed alarm engaged. 03/22/2022 6:26 AM

## 2022-03-23 LAB — CBC WITH AUTO DIFFERENTIAL
BKR WAM ABSOLUTE IMMATURE GRANULOCYTES.: 0.01 x 1000/ÂµL (ref 0.00–0.30)
BKR WAM ABSOLUTE LYMPHOCYTE COUNT.: 1.69 x 1000/ÂµL (ref 0.60–3.70)
BKR WAM ABSOLUTE NRBC (2 DEC): 0 x 1000/ÂµL (ref 0.00–1.00)
BKR WAM ANALYZER ANC: 2.97 x 1000/ÂµL (ref 2.00–7.60)
BKR WAM BASOPHIL ABSOLUTE COUNT.: 0.04 x 1000/ÂµL (ref 0.00–1.00)
BKR WAM BASOPHILS: 0.7 % (ref 0.0–1.4)
BKR WAM EOSINOPHIL ABSOLUTE COUNT.: 0.42 x 1000/ÂµL (ref 0.00–1.00)
BKR WAM EOSINOPHILS: 7.1 % — ABNORMAL HIGH (ref 0.0–5.0)
BKR WAM HEMATOCRIT (2 DEC): 36.2 % (ref 35.00–45.00)
BKR WAM HEMOGLOBIN: 11.4 g/dL — ABNORMAL LOW (ref 11.7–15.5)
BKR WAM IMMATURE GRANULOCYTES: 0.2 % (ref 0.0–1.0)
BKR WAM LYMPHOCYTES: 28.7 % (ref 17.0–50.0)
BKR WAM MCH (PG): 26.5 pg — ABNORMAL LOW (ref 27.0–33.0)
BKR WAM MCHC: 31.5 g/dL (ref 31.0–36.0)
BKR WAM MCV: 84 fL (ref 80.0–100.0)
BKR WAM MONOCYTE ABSOLUTE COUNT.: 0.76 x 1000/ÂµL (ref 0.00–1.00)
BKR WAM MONOCYTES: 12.9 % — ABNORMAL HIGH (ref 4.0–12.0)
BKR WAM MPV: 9.6 fL (ref 8.0–12.0)
BKR WAM NEUTROPHILS: 50.4 % (ref 39.0–72.0)
BKR WAM NUCLEATED RED BLOOD CELLS: 0 % (ref 0.0–1.0)
BKR WAM PLATELETS: 417 x1000/ÂµL (ref 150–420)
BKR WAM RDW-CV: 16.5 % — ABNORMAL HIGH (ref 11.0–15.0)
BKR WAM RED BLOOD CELL COUNT.: 4.31 M/ÂµL (ref 4.00–6.00)
BKR WAM WHITE BLOOD CELL COUNT: 5.9 x1000/ÂµL (ref 4.0–11.0)

## 2022-03-23 LAB — BASIC METABOLIC PANEL
BKR ANION GAP: 10 (ref 7–17)
BKR BLOOD UREA NITROGEN: 10 mg/dL (ref 8–23)
BKR BUN / CREAT RATIO: 31.3 — ABNORMAL HIGH (ref 8.0–23.0)
BKR CALCIUM: 10.3 mg/dL — ABNORMAL HIGH (ref 8.8–10.2)
BKR CHLORIDE: 103 mmol/L (ref 98–107)
BKR CO2: 23 mmol/L (ref 20–30)
BKR CREATININE: 0.32 mg/dL — ABNORMAL LOW (ref 0.40–1.30)
BKR EGFR, CREATININE (CKD-EPI 2021): >60 mL/min/{1.73_m2} (ref >=60)
BKR GLUCOSE: 98 mg/dL (ref 70–100)
BKR POTASSIUM: 3.8 mmol/L (ref 3.3–5.3)
BKR SODIUM: 136 mmol/L (ref 136–144)

## 2022-03-23 NOTE — Plan of Care
Plan of Care Overview/ Patient Status    Patient alert & oriented to self, denies pain, VSS on RA. 20G PIV to right arm patent. G tube in place, residual <200, morning and lunch bolus feeds administered. Ileostomy to LLQ intact, no output. Ileostomy to RLQ intact, dark brown watery stool noted. Incontinent of urine, Purewick placed this morning, draining yellow urine. Bed rest, assist x2 in bed. Wound care completed. Bilateral boots in use. Bed in lowest position, bed alarm set, call light within use.

## 2022-03-23 NOTE — Plan of Care
Plan of Care Overview/ Patient Status    1530-1900A&O: to self Resp: room airGI/GU: ileostomy LUQ no output, ileostomy RLQ producing stool, incontinent of urine purwick in placeSkin: see flowsheets,heels off loaded with boots. Q2 T&R provided. Diet: TF bolus with tray. 1:1 feedPain: none stated Mobility: total Pills: crushed via PEGSafety maintained, call bell in reach, bed alarm set, rounding continued. Vital signs    Vitals:  03/23/22 0325 03/23/22 0834 03/23/22 1233 03/23/22 1636 BP: 114/73 104/68 111/71 112/71 Pulse: 81 79 83 76 Resp: 18  18 18  Temp: 98.1 ?F (36.7 ?C) 98.1 ?F (36.7 ?C) 98 ?F (36.7 ?C) 98.2 ?F (36.8 ?C) TempSrc: Oral Axillary Axillary Axillary SpO2: 100% 97% 99% 99% Weight:     Height:

## 2022-03-23 NOTE — Progress Notes
[facility] Medicine Progress NoteAttending Provider: Tresea Mall, MD Subjective                                                                              Subjective: Interim History: Denies any new complaints as Chest Pain - SOB - Abdominal pain -No acute events overnight. Review of Allergies/Meds/Hx: Review of Allergies/Meds/Hx:I have reviewed the patient's: current scheduled medicationsScheduled Meds:Current Facility-Administered Medications Medication Dose Route Frequency Provider Last Rate Last Admin ? [Held by provider] apixaban (ELIQUIS) tablet 5 mg  5 mg Per G Tube Q12H Littie Deeds, MD     ? Jerrel Ivory Plus oral powder packet  1 packet Oral BID Tawana Scale, MD   1 packet at 03/23/22 [phone] ? chlorhexidine gluconate (PERIDEX) 0.12 % solution 15 mL  15 mL Mouth/Throat BID Dorisann Frames, MD   15 mL at 03/23/22 0854 ? Elemental iron 60 mg (300 mg Ferrous sulfate) / 5 mL oral liquid 45 mg  45 mg Oral BID Tawana Scale, MD   45 mg at 03/23/22 0854 ? enoxaparin (LOVENOX) injection 80 mg  80 mg Subcutaneous Q12H Chaar, Abdelkader, MD   80 mg at 03/23/22 0854 ? famotidine (PEPCID) suspension (8 mg/mL) 20 mg  20 mg Oral Daily Ofori-Mante, Warrick Parisian, MD   20 mg at 03/23/22 0855 ? folic acid (FOLVITE) tablet 1 mg  1 mg Oral Daily Ofori-Mante, Warrick Parisian, MD   1 mg at 03/23/22 0865 ? niacin Immediate Release tablet 100 mg  100 mg Oral Nightly Tawana Scale, MD   100 mg at 03/22/22 2149 ? nystatin (MYCOSTATIN) powder 1 Application  1 Application Topical (Top) BID Henriette Combs, MD   1 Application at 03/23/22 [phone] ? pyridoxine (vitamin B6) (B-6) tablet 100 mg  100 mg Oral Daily Tawana Scale, MD   100 mg at 03/23/22 0855 ? [Held by provider] rosuvastatin (CRESTOR) tablet 10 mg  10 mg Per G Tube Nightly Dorisann Frames, MD   10 mg at 02/14/22 2245 ? sodium chloride 0.9 % flush 3 mL  3 mL IV Push Q8H Dorisann Frames, MD   3 mL at 03/23/22 1320 ? thiamine (VITAMIN B1) tablet 100 mg  100 mg Oral BID Tawana Scale, MD   100 mg at 03/23/22 0855 Continuous Infusions:PRN Meds:.acetaminophen, dextrose (GLUCOSE) oral liquid 15 g **OR** fruit juice **OR** skim milk, dextrose (GLUCOSE) oral liquid 30 g **OR** fruit juice, dextrose injection, dextrose injection, glucagon, ondansetron **OR** ondansetron (ZOFRAN) IV Push, sodium chloride Objective Objective: Vitals:Last 24 hours: Temp:  [97.5 ?F (36.4 ?C)-98.5 ?F (36.9 ?C)] 98 ?F (36.7 ?C)Pulse:  [79-98] 83Resp:  [16-19] 18BP: (104-125)/(68-81) 111/71SpO2:  [95 %-100 %] 99 %I/O's:Gross Totals (Last 24 hours) at 03/23/2022 1419Last data filed at 03/23/2022 1200Intake 919 ml Output 0 ml Net 919 ml Procedures:None Physical Exam General: alert but confused at times. No distress. HENT: Head: Normocephalic and atraumatic. Eyes: Pupils are equal, round, and reactive to light. EOM are normal. Neck: Normal range of motion. Neck supple. Cardiovascular: Normal rate and regular rhythm. Normal S1 and S2 - No murmurs/galops/rubs.Pulmonary/Chest: Effort normal and breath sounds normal. No wheezes/rhonci/rales. Abdominal: Soft. Bowel sounds  are normal. There is no abdominal tenderness. No CVA tenderness. Ostomy bag in place.Neurological: No focal sensory or motor deficit. Extremities: No Lower extremity edema  Labs:[facility] 04/02/230514 WBC 5.9 HGB 11.4* HCT 36.20 MCV 84.0 PLT 417 BMPRec[facility] Lab 04/02/230514 NA 136 K 3.8 CL 103 CO2 23 ANIONGAP 10 BUN 10 CREATININE 0.32* CALCIUM 10.3* LFTsNo results for input(s): ALT, AST, ALKPHOS, BILITOT, BILIDIR, ALBUMIN in the last 168 hours.CoagsNo results for input(s): LABPROT, PTT, INR in the last 168 hours.ANEMIA STUDIESNo results for input(s): FERRITIN, IRON, TIBC, IRONSAT, VITB12 in the last 168 hours.GLUCOSERecent [facility] Lab 04/02/230514 GLU 98 A1CNo results found for: HGBA1CMICROBIOLOGYNo results for input(s): LABBLOO, LABURIN, LOWERRESPIRA in the last 168 hours.Diagnostics:XR Chest PA or AP (Portable)Result Date: 3/24/2023XR CHEST PA OR AP Date: 03/14/2022 7:56 AM INDICATION: fever, possible cough COMPARISON: XR CHEST PA OR AP 2023-Feb-21 FINDINGS: Lungs and Pleura: No focal consolidations. No pulmonary edema. No pleural effusion or pneumothorax. Heart and Mediastinum:  Cardiomediastinal contours are unchanged.  No consolidations to suggest pneumonia. Reported And Signed By: Leilani Merl, MD  [facility] Radiology and Biomedical Imaging MRI Brain w wo IV ContrastResult Date: 3/5/2023MRI BRAIN W WO IV CONTRAST performed on 02/23/2022 12:12 PM INDICATION: pt with hx of rheumatoid arthritis, recent prolonged hospitalization with multiple infections, now with encephalopahty and LE weakness of unclear etiology. HISTORY PER EMR: 66 y.o. female PMHx RA   MTX (off since November), prolonged hospitalization in [place] for perforated diverticulitis s/p ex lap/R hemicolectomy/end ileostomy, persistent encephalopathy multiple stable MRIs, surgical incision abscess s/p drainage, G-tube placement, DVT on AC a/w persistent encephalopathy, fevers and leukocytosis. ID consulted to help source of infection, improving on IV abx.  Course c/b G-tube leaking, hypernatremia, transaminitis,  ongoing encephalopathy without significant improvement, work up still ongoing. COMPARISON: [place] of February 12, 2022. TECHNIQUE: Multiplanar MR of the brain was performed before and after administration of  15  cc of Dotarem intravenously.  FINDINGS: Images are partially degraded by motion artifacts. 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 There is no intracranial hemorrhage or major vascular distribution infarct. No abnormal enhancement is seen within limitations of motion artifacts. There is no mass effect, edema, or midline shift. The ventricles are symmetric and normal in size.  No extra-axial collection is seen. The pituitary, pineal gland, craniocervical junction, and upper cervical spinal cord are unremarkable, volume loss again noted with enlarged sulci in the cerebral hemispheres with decreased mammillary bodies correlate with EtOH, and thiamine vitamin B1 levels. Unremarkable orbits, nonspecific T1 marrow signal, possibly marrow replacement from anemia and or drug therapy. Paranasal sinuses and mastoids remain unremarkable. Heterogeneous maxillary alveolus, prior CTA 12/12/2018 lucency likely dental disease, maxillary alveolus and mandible, (series 2 image [id number]) correlate with dental inspection and patient with ongoing infection. 1. 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 2. Redemonstration microvascular disease no new [place] restricted diffusion hemorrhage or midline shift. 3.  Motion limits assessment of the heterogeneous maxillary alveolus correlate with dental inspection Report Initiated By:  Neta Mends, MD Reported And Signed By: Lynett Fish, MD  [facility] XR Foot Bilateral AP Lateral and ObliqueResult Date: 3/7/2023AP, LATERAL AND OBLIQUE BILATERAL FOOT X-RAY Clinical Information: Baseline assessment. Assess for erosions. Comparison: None Findings: Left foot: The overall bony mineralization appears qualitatively diminished. There is evidence of joint space narrowing indicative of arthropathy involving the interphalangeal joint and metatarsal phalangeal joint of the first digit as well as the DIP and PIP joints of the remaining digits. There is mild hallux valgus as well as bunion formation. The moderate degenerative changes are noted in the midfoot. There is no acute osseous injury. Right foot: The overall bony mineralization appears qualitatively diminished. There is evidence of joint space narrowing indicative of arthropathy involving the interphalangeal joint and metatarsal phalangeal joint of the first digit as well as the DIP and PIP joints of the remaining digits. There is moderate to severe hallux valgus as well as bunion formation. The moderate degenerative changes are noted in the midfoot. There is no acute osseous injury. 1. Diffuse degenerative changes without evidence of acute osseous injury. There is no evidence of bony erosion. Reported And Signed By: Merdis Delay, MD  [facility] Radiology and Biomedical Imaging MRI Total Spine w wo IV ContrastResult Date: 3/5/2023MRI TOTAL SPINE W WO IV CONTRAST([facility]) performed on 02/23/2022 11:56 AM INDICATION: Pt with recent prolonged hospitalization and multiple infections, now with LE weakness of unclear etiology. Please eval for any spinal collections/etiology to explain progressive weakness. COMPARISON: Outside study MR cervical spine without contrast 01/02/2022 and MR thoracic and lumbar spine without contrast 01/04/2022 TECHNIQUE: MRI of the total spine without and with contrast was performed after administration of  15  cc of Dotarem intravenously.  FINDINGS: Images are degraded by motion artifacts, particularly the axial T2 and the postcontrast sequences. Cervical Spine: Visualized posterior fossa is within normal limits. Minimal retrolisthesis C6 on C7 again noted. Loss of lordosis similar mild anterior wedging of C5 and C6, likely chronic related to degenerative disease. Vertebral body heights and alignment are otherwise normal. The spinal cord is grossly normal in caliber and signal. No definite abnormal enhancement intrathecal enhancement is seen. Mild perifacet edema or enhancement is identified. Small focus of enhancement in the cord at C3-C4 level is likely artifactual. Evaluation for cord T2 signal changes is substantially degraded by motion. Loss of disc space height and T2 signal again noted at C5-C6 and C6-7 compatible with disc desiccation. C3/4: There are left uncovertebral osteophytes with facet arthropathy likely resulting in moderate left neural foraminal stenosis. C4/5: There is no obvious significant canal or neural foramen stenosis and mild disc bulging and mild right-sided facet arthropathy. C5/6: Posterior disc osteophyte complex with ligamentum flavum thickening resulting in mild spinal canal stenosis bilateral uncovertebral osteophytes, more on the left with facet arthropathy resulting in moderate right and severe left neural foraminal stenosis. C6/7: Posterior disc osteophyte complex with ligamentum flavum thickening resulting in mild spinal canal stenosis. Bilateral uncovertebral osteophytes and facet hypertrophy causing severe bilateral neural foraminal stenosis. Thoracic Spine: There is scoliotic curvature of the thoracic spine with convexity to the left superiorly and to the right inferiorly. Slight exaggerated thoracic kyphosis The vertebral body heights maintained. Vertebral marrow signal is normal. No abnormal intrathecal enhancement is seen. Cord signal is grossly normal. No significant canal compromise. Scoliosis and right-sided facet arthropathy results in mild right neural foramen stenosis at T3-T4, T4-T5 and T5-T6 Lumbar Spine: There is scoliotic curvature of the lumbar spine with convexity to the left. Vertebral body height and marrow signal are within  normal limits. There is no signal abnormality within the spinal cord. The conus terminates at L1. No abnormal intrathecal enhancement is seen. Loss of disc space height and T2 signal L2-3 through L5-S1 compatible with disc desiccation. No suspicious findings to suggest discitis/osteomyelitis. L2/3: There is diffuse disc bulge and mild facet arthropathy resulting in mild spinal canal and mild bilateral neural foraminal stenosis. L3/4: There is diffuse disc bulge, ligamentum flavum thickening and facet arthropathy without significant canal stenosis. A right subarticular-foraminal disc protrusion again appears to be contacting the right L4 nerve root lateral recess and the right L3 nerve root neural foramen.. Left foraminal annular fissure again noted. L4/5: There is diffuse disc bulge with ligamentum flavum thickening, facet arthropathy and left foraminal disc protrusion resulting in mild spinal canal stenosis and moderate bilateral neural foraminal stenosis, worse on the left and contact with the left L4 nerve root, probably unchanged. Mild bilateral perifacet edema again noted as well as mild perifacet enhancement suggesting some associated inflammation L5/S1: There is disc space narrowing, disc-osteophyte complex asymmetric on the left again results in bilateral neural foramen stenosis worse on the left and contact with the left L5 nerve root in the neural foramen and left S1 nerve root lateral recess Note is made of a balloon-retained retained gastrostomy. There is a suggestion of a 4 cm calcified uterine fibroid on the localizing images. Note is made of colonic diverticulosis. Mild atrophy of the lumbar paraspinal muscles. Increased T2 signal and enhancement is noted diffusely throughout the posterior paraspinal muscles without associated soft tissue swelling could be related to denervation or inflammation. 1. Scoliotic deformity of the thoracolumbar spine. 2. 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 3. No obvious epidural abscess or paraspinal collection. No findings suspicious for discitis/osteomyelitis. Mild perifacet inflammatory changes identified at L4-5, likely degenerative. Mild increased T2 signal and enhancement within the posterior paraspinal muscles diffusely throughout the lumbar spine could be related to denervation or inflammation. Examination is significantly degraded by patient motion and sagittal T2 signal changes within the spinal cord or abnormal intrathecal enhancement may not be apparent on this examination. Examination to be repeated with sedation if clinically indicated. Report Initiated By:  Neta Mends, MD Reported And Signed By: Aviva Kluver, MD  [facility] Radiology and Biomedical Imaging ECG/Tele Events: No ECG ordered today Assessment Assessment: Assessment:This is a 66 y.o. female PMHx of RA, recent prolonged hospitalization for perforated diverticulitis (s/p ex Lap/R hemicolectomy with end ileostomy) admitted for persistent encephalopathy/FTT and likely need for LTACH with work up revealing fever with unclear etiology (resolved). Has undergone extensive work-up of encephalopathy and leg weakness which is unremarkable. Course c/b elevated LFTs which resolved, likely 2/2 DILI from Ceftriaxone or Zosyn. Mental status appears overall stable. Plan Plan: # ACUTE PROBLEMS# Dislodged PEG tube - PEG replacement by IR  3/31- TF resumed so far tolerating- Poor po intake over all approx 25% - cleared by SLP for pureed diet and thin liq?# Altered mental status / LE weakness - MS improved - Unclear etiology, possibly prolonged delirium iso hospitalization from Nov 2022 - currentMRI brain, MRI total spine unremarkable- LP unremarkable- Paraneoplastic and autoimmune encephalopathy panels negative- Neurology rec'd EMG to complete work-up of LE weakness but pt declined; Neurology does not recommend any further testing and signed off 02/26/22?# Right foot cellulites with fever - transient fever resolved, no leukocytosis - CXR neg, UA neg. CRP 25.8, procal neg. Xray feet with degenerative changes but no acute findings - Suspected source  was R foot cellulitis- S/P CTX 2/21 and Zosyn 2/21-2/25; S/P Empiric ABX-Oxacillin --stopped with negative infectious work-up- Consulted Podiatry--- WBAT to BLE.No?indication for podiatric surgical intervention at this time. ?- Patient may follow-up with Dr. Gazes within 3-4 weeks of discharge.?- Trend fever/CBC- Tylenol prn?# Hypercalcemia - Now resolved- Per Endocrinology assessment :?	Hypercalcemia in the setting of PTH that is in mid reference range ( 28 -30) and elevated x 2 mid molecule PTH. ??	PTH is higher than would be expected for a non-PTH mediated mechanism of hypercalcemia. 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	Taking into consideration clinical scenario, hypercalcemia of immobilization is very likely contributing and probably the leading player in the etiology of hypercalcemia. ?	Risk of hypocalcemia exists but probably low in this patient. No additional recommendations. - S/P Zolendronic acid 4 mg X1 (3/21)- Trend Ca and Albumin as needed while IP?#  Abnormal LFTS ( resolved ) - attributed to DILI from antibiotics earlier - now resolved # Dysphagia - SLP eval 3/27 cleared for puree and thin liquids- Re-eval for diet upgrade later- resume TFs- FWF- Calorie counts- Requires 1: 1 feeding assistance- Aspiration precautions# CHRONIC PROBLEMS # RA Stable , Per Rheumatology evaluation : ?	No indication for additional immunosuppressive therapy. ?	Cont to hold MTX. ?	Use artifical tears PRN for dry eyes. ?	Mouth hygiene. If c/o dry mouth, can start on cevimeline 30 mg TID. ?	Will need to arrange follow up in [facility] Rheumatology clinic.# Recent diagnosis of DVT Per chart review, this was diagnosed incidentally on 2/1 [place] abdomen imaging ( Incidental note of expansile, hypodense thrombus within the bilateral common femoral veins, with fat stranding about the vessels. 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 Cytology of peritoneal nodules at that time as well was negative for malignancy as well. Duration of therapy remains unclear so far - she needs hematology follow up in the outpatient setting for further discussion. FULL CODE lovenox Tube feeds with tray ( dysphagia diet ) dispo : Awaiting T19-conservatorship 20 minutes spent in this encounter, >50% spent evaluating patient, reviewing labs/imaging, counseling with patient about plan of care, answering questions, and coordination of care.Electronically Signed:Emberlie Gotcher A. Marquis Lunch, MD  Internal Medicine Hospitalist AttendingYale Medstar [facility] At [facility] Contact via : [facility] or Smart Web4/01/2022, 2:18 PM

## 2022-03-23 NOTE — Plan of Care
Plan of Care Overview/ Patient Status    Temp:  [97.4 ?F (36.3 ?C)-98.5 ?F (36.9 ?C)] 98.1 ?F (36.7 ?C)Pulse:  [79-98] 81Resp:  [16-19] 18BP: (109-125)/(70-81) 114/73SpO2:  [95 %-100 %] 100 %Device (Oxygen Therapy): room air1900 -0700 Neuro: A&Ox 1Resp: WDL on room airCardiac:WDL, not on tele GI/GU: ileostomy L & R, G tube, patent, flowing to gravity.MS:Ax2, bedfastSkin:See flowsheetsDiet: Pureed, thin liquids, pills via g tubePain: None indicated/reported Scheduled medications were administered per MAR, tolerated well. Hourly rounds and q2 hr turn and repositioning performed. Pt required reminders to refrain from manipulating her ostomy bags and gastric tube. Safety maintained, call bell within reach bed alarm engaged.  03/23/2022 6:46 AM

## 2022-03-24 ENCOUNTER — Inpatient Hospital Stay: Admit: 2022-03-24 | Payer: PRIVATE HEALTH INSURANCE

## 2022-03-24 DIAGNOSIS — R627 Adult failure to thrive: Secondary | ICD-10-CM

## 2022-03-24 MED ORDER — PYRIDOXINE (VITAMIN B6) 50 MG TABLET
50 mg | Freq: Every day | GASTROSTOMY | Status: DC
Start: 2022-03-24 — End: 2022-04-19
  Administered 2022-03-25 – 2022-04-19 (×26): 50 mg via GASTROSTOMY

## 2022-03-24 MED ORDER — IOHEXOL 300 MG IODINE/ML ORAL
300 mg/mL | Freq: Once | GASTROSTOMY | Status: DC | PRN
Start: 2022-03-24 — End: 2022-04-10

## 2022-03-24 MED ORDER — BANANA FLAKES-TRANSGALACTOOLIGOSACCHARIDE ORAL POWDER PACKET
Freq: Two times a day (BID) | GASTROSTOMY | Status: DC
Start: 2022-03-24 — End: 2022-04-19
  Administered 2022-03-25 – 2022-04-19 (×48): via GASTROSTOMY

## 2022-03-24 MED ORDER — ACETAMINOPHEN 650 MG/20.3 ML ORAL SUSPENSION
65020.3 mg/20.3 mL | Freq: Four times a day (QID) | GASTROSTOMY | Status: DC | PRN
Start: 2022-03-24 — End: 2022-04-19
  Administered 2022-03-25 – 2022-04-13 (×16): 650 mL via GASTROSTOMY

## 2022-03-24 MED ORDER — FAMOTIDINE 40 MG/5 ML (8 MG/ML) ORAL SUSPENSION
8 mg/ml | Freq: Every day | GASTROSTOMY | Status: DC
Start: 2022-03-24 — End: 2022-04-19
  Administered 2022-03-25 – 2022-04-18 (×25): 8 mL via GASTROSTOMY

## 2022-03-24 MED ORDER — NIACIN IMMEDIATE RELEASE 100 MG TABLET
100 mg | Freq: Every evening | GASTROSTOMY | Status: DC
Start: 2022-03-24 — End: 2022-04-19
  Administered 2022-03-26 – 2022-04-19 (×25): 100 mg via GASTROSTOMY

## 2022-03-24 MED ORDER — THIAMINE HCL (VITAMIN B1) 100 MG TABLET
100 mg | Freq: Two times a day (BID) | GASTROSTOMY | Status: DC
Start: 2022-03-24 — End: 2022-04-19
  Administered 2022-03-25 – 2022-04-19 (×51): 100 mg via GASTROSTOMY

## 2022-03-24 MED ORDER — FERROUS SULFATE 300 MG (60 MG IRON)/5 ML ORAL LIQUID
603005 mg (300 mg Ferrous sulfate) / 5 mL | Freq: Two times a day (BID) | GASTROSTOMY | Status: DC
Start: 2022-03-24 — End: 2022-04-19
  Administered 2022-03-25 – 2022-04-19 (×50): 60 mL via GASTROSTOMY

## 2022-03-24 MED ORDER — FOLIC ACID 1 MG TABLET
1 mg | Freq: Every day | GASTROSTOMY | Status: DC
Start: 2022-03-24 — End: 2022-04-19
  Administered 2022-03-25 – 2022-04-19 (×26): 1 mg via GASTROSTOMY

## 2022-03-24 NOTE — Progress Notes
Inpatient Occupational Therapy Progress NoteDefault Flowsheet Data (most recent)   IP Adult OT Eval/Treat - 03/24/22 0901    Date of Visit / Treatment  Date of Visit / Treatment 03/24/22   Note Type Progress Note   Progress Report Due 04/01/22   End Time 0901    General Information  Pertinent History Of Current Problem Per chart: 66 y.o. 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  Was deemed to lack capaciity, however required ethics consult as family difficult to contact for medical decision making, s/p PEG placement at that time.  Course further c/b b/l DVT, started on AC.  Now presented to [facility] for placement as family unable to care for her, found to be febrile/tachycardic   Precautions/Limitations Fall Precautions    Vital Signs and Orthostatic Vital Signs  Vital Signs Vital Signs Stable    Pain/Comfort  Location #1 - PreTreatment Rating (Numbers Scale) 0/10 - no pain    Patient Coping  Observed Emotional State accepting    Cognition  Overall Cognitive Status Impaired   Level of Consciousness alert   Following Commands Follows one step commands without difficulty   Personal Safety / Judgment Fall risk   Attention Requires redirection    Range of Motion  Range of Motion Examination WFL except    Musculoskeletal  LUE Muscle Strength Grading 2-->active movement with gravity eliminated   RUE Muscle Strength Grading 2-->active movement with gravity eliminated   LLE Muscle Strength Grading 2-->active movement with gravity eliminated   RLE Muscle Strength Grading 2-->active movement with gravity eliminated    Muscle Tone  Muscle Tone Testing Results No muscle tone deficits noted    Coordination  Opposition Impaired bilaterally    Sensory Assessment  Sensory Tests Results No sensory impairment noted    Skin Assessment  Skin Assessment See Nursing Documentation    Balance  Sitting Balance: Static  POOR      Moderate assist to maintain static position with no Assistive Device   Sitting Balance: Dynamic  POOR     Moves through 1/4 to 1/2 ROM range with moderate assist to right self   Standing Balance: Static POOR      Moderate assist to maintain static position with no Assistive Device     AM-PAC - Daily Activity IP Short Form  Help needed from another person putting on/taking off regular lower body clothing 2 - A Lot   Help needed from another person for bathing (incl. washing, rinsing, drying) 2 - A Lot   Help needed from another person for toileting (incl. using toilet, bedpan, urinal) 2 - A Lot   Help needed from another person putting on/taking off regular upper body clothing 2 - A Lot   Help needed from another person taking care of personal grooming such as brushing teeth 2 - A Lot   Help needed from another person eating meals 2 - A Lot   AM-PAC Daily Activity Raw Score (Total of rows above) 12   CMS Score (based on Raw Score - with G Code) 12 - 66.57% impaired      (G Code - CL)    Bed Mobility  Supine-to-Sit Independence/Assistance Level Moderate assist;Assist of 2   Supine-to-Sit Assist Device Hand held assist;Draw pad    Handoff Documentation  Handoff Patient in bed    Endurance  Endurance Comments fair    Clinical Impression  Follow up Assessment Pyt tolerating EOB activities and first step of mobility progression    Frequency/Equipment Recommendations  OT Frequency 2x per week   What day of week is next treatment expected? Thursday   4/6  OT/OTA completing this assessment GT    OT Discharge Summary  Disposition Recommendation Short Term Rehab

## 2022-03-24 NOTE — Progress Notes
[facility] Medicine Progress NoteAttending Provider: Tresea Mall, MD Subjective                                                                              Subjective: Interim History: Denies any new complaints as Chest Pain - SOB - Abdominal pain -Overnight, patient was reported to have pulled her G-tube.  Repeated abdominal x-ray revealed G-tube in the stomach however recommended repeat KUB with contrast through the tube to determine terminal and position. Review of Allergies/Meds/Hx: Review of Allergies/Meds/Hx:I have reviewed the patient's: current scheduled medicationsScheduled Meds:Current Facility-Administered Medications Medication Dose Route Frequency Provider Last Rate Last Admin ? [Held by provider] apixaban (ELIQUIS) tablet 5 mg  5 mg Per G Tube Q12H Littie Deeds, MD     ? Jerrel Ivory Plus oral powder packet  1 packet Oral BID Tawana Scale, MD   1 packet at 03/24/22 1112 ? chlorhexidine gluconate (PERIDEX) 0.12 % solution 15 mL  15 mL Mouth/Throat BID Dorisann Frames, MD   15 mL at 03/24/22 1111 ? Elemental iron 60 mg (300 mg Ferrous sulfate) / 5 mL oral liquid 45 mg  45 mg Oral BID Tawana Scale, MD   45 mg at 03/24/22 1110 ? enoxaparin (LOVENOX) injection 80 mg  80 mg Subcutaneous Q12H Chaar, Abdelkader, MD   80 mg at 03/24/22 1111 ? famotidine (PEPCID) suspension (8 mg/mL) 20 mg  20 mg Oral Daily Ofori-Mante, Warrick Parisian, MD   20 mg at 03/24/22 1111 ? folic acid (FOLVITE) tablet 1 mg  1 mg Oral Daily Ofori-Mante, Warrick Parisian, MD   1 mg at 03/24/22 1111 ? niacin Immediate Release tablet 100 mg  100 mg Oral Nightly Tawana Scale, MD   100 mg at 03/23/22 2229 ? nystatin (MYCOSTATIN) powder 1 Application  1 Application Topical (Top) BID Henriette Combs, MD   1 Application at 03/24/22 1113 ? pyridoxine (vitamin B6) (B-6) tablet 100 mg  100 mg Oral Daily Tawana Scale, MD   100 mg at 03/24/22 1111 ? [Held by provider] rosuvastatin (CRESTOR) tablet 10 mg  10 mg Per G Tube Nightly Dorisann Frames, MD   10 mg at 02/14/22 2245 ? sodium chloride 0.9 % flush 3 mL  3 mL IV Push Q8H Dorisann Frames, MD   3 mL at 03/23/22 2230 ? thiamine (VITAMIN B1) tablet 100 mg  100 mg Oral BID Tawana Scale, MD   100 mg at 03/24/22 1111 Continuous Infusions:PRN Meds:.acetaminophen, dextrose (GLUCOSE) oral liquid 15 g **OR** fruit juice **OR** skim milk, dextrose (GLUCOSE) oral liquid 30 g **OR** fruit juice, dextrose injection, dextrose injection, glucagon, ondansetron **OR** ondansetron (ZOFRAN) IV Push, sodium chloride Objective Objective: Vitals:Last 24 hours: Temp:  [97.4 ?F (36.3 ?C)-98.8 ?F (37.1 ?C)] 97.8 ?F (36.6 ?C)Pulse:  [68-94] 94Resp:  [17-18] 18BP: (107-136)/(67-88) 112/75SpO2:  [97 %-100 %] 97 %I/O's:Gross Totals (Last 24 hours) at 03/24/2022 1801Last data filed at 03/23/2022 2200Intake 637 ml Output 50 ml Net 587 ml Procedures:None Physical Exam General: alert but confused at times. No distress. HENT: Head: Normocephalic and atraumatic. Eyes: Pupils are equal, round, and reactive to light. EOM are normal. Neck: Normal range of  motion. Neck supple. Cardiovascular: Normal rate and regular rhythm. Normal S1 and S2 - No murmurs/galops/rubs.Pulmonary/Chest: Effort normal and breath sounds normal. No wheezes/rhonci/rales. Abdominal: Soft. Bowel sounds are normal. There is no abdominal tenderness. No CVA tenderness. Neurological: No focal sensory or motor deficit. Extremities: No Lower extremity edema  Labs:[facility] 04/02/230514 WBC 5.9 HGB 11.4* HCT 36.20 MCV 84.0 PLT 417 BMPRecent [facility] Lab 04/02/230514 NA 136 K 3.8 CL 103 CO2 23 ANIONGAP 10 BUN 10 CREATININE 0.32* CALCIUM 10.3* LFTsNo results for input(s): ALT, AST, ALKPHOS, BILITOT, BILIDIR, ALBUMIN in the last 168 hours.CoagsNo results for input(s): LABPROT, PTT, INR in the last 168 hours.ANEMIA STUDIESNo results for input(s): FERRITIN, IRON, TIBC, IRONSAT, VITB12 in the last 168 hours.GLUCOSERecent [facility] Lab 04/02/230514 GLU 98 A1CNo results found for: HGBA1CMICROBIOLOGYNo results for input(s): LABBLOO, LABURIN, LOWERRESPIRA in the last 168 hours.Diagnostics:XR Chest PA or AP (Portable)Result Date: 3/24/2023XR CHEST PA OR AP Date: 03/14/2022 7:56 AM INDICATION: fever, possible cough COMPARISON: XR CHEST PA OR AP 2023-Feb-21 FINDINGS: Lungs and Pleura: No focal consolidations. No pulmonary edema. No pleural effusion or pneumothorax. Heart and Mediastinum:  Cardiomediastinal contours are unchanged.  No consolidations to suggest pneumonia. Reported And Signed By: Leilani Merl, MD  [facility] Radiology and Biomedical Imaging XR Abdomen APResult Date: 4/3/2023Study: XR ABDOMEN AP Indication: g tube positioning Comparison: None Findings and Impression: A single view abdominal radiograph was obtained. A feeding tube overlies the abdomen at the expected location of stomach, but unknown in its precise location/lumen termination. Consider repeating the exam after administering contrast through the tube to determine location. Report Initiated By:  Orlie Pollen, MD Reported And Signed By: Elray Buba, MD  [facility] Radiology and Biomedical Imaging MRI Brain w wo IV ContrastResult Date: 3/5/2023MRI BRAIN W WO IV CONTRAST performed on 02/23/2022 12:12 PM INDICATION: pt with hx of rheumatoid arthritis, recent prolonged hospitalization with multiple infections, now with encephalopahty and LE weakness of unclear etiology. HISTORY PER EMR: 66 y.o. female PMHx RA   MTX (off since November), prolonged hospitalization in [place] for perforated diverticulitis s/p ex lap/R hemicolectomy/end ileostomy, persistent encephalopathy multiple stable MRIs, surgical incision abscess s/p drainage, G-tube placement, DVT on AC a/w persistent encephalopathy, fevers and leukocytosis. ID consulted to help source of infection, improving on IV abx.  Course c/b G-tube leaking, hypernatremia, transaminitis,  ongoing encephalopathy without significant improvement, work up still ongoing. COMPARISON: [place] of February 12, 2022. TECHNIQUE: Multiplanar MR of the brain was performed before and after administration of  15  cc of Dotarem intravenously.  FINDINGS: Images are partially degraded by motion artifacts. 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 There is no intracranial hemorrhage or major vascular distribution infarct. No abnormal enhancement is seen within limitations of motion artifacts. There is no mass effect, edema, or midline shift. The ventricles are symmetric and normal in size.  No extra-axial collection is seen. The pituitary, pineal gland, craniocervical junction, and upper cervical spinal cord are unremarkable, volume loss again noted with enlarged sulci in the cerebral hemispheres with decreased mammillary bodies correlate with EtOH, and thiamine vitamin B1 levels. Unremarkable orbits, nonspecific T1 marrow signal, possibly marrow replacement from  anemia and or drug therapy. Paranasal sinuses and mastoids remain unremarkable. Heterogeneous maxillary alveolus, prior CTA 12/12/2018 lucency likely dental disease, maxillary alveolus and mandible, (series 2 image [id number]) correlate with dental inspection and patient with ongoing infection. 1. 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 2. Redemonstration microvascular disease no new new [place] restricted diffusion hemorrhage or midline shift. 3.  Motion limits assessment of the heterogeneous maxillary alveolus correlate with dental inspection Report Initiated By:  Neta Mends, MD Reported And Signed By: Lynett Fish, MD  [facility] Radiology and Biomedical Imaging XR Foot Bilateral AP Lateral and ObliqueResult Date: 3/7/2023AP, LATERAL AND OBLIQUE BILATERAL FOOT X-RAY Clinical Information: Baseline assessment. Assess for erosions. Comparison: None Findings: Left foot: The overall bony mineralization appears qualitatively diminished. There is evidence of joint space narrowing indicative of arthropathy involving the interphalangeal joint and metatarsal phalangeal joint of the first digit as well as the DIP and PIP joints of the remaining digits. There is mild hallux valgus as well as bunion formation. The moderate degenerative changes are noted in the midfoot. There is no acute osseous injury. Right foot: The overall bony mineralization appears qualitatively diminished. There is evidence of joint space narrowing indicative of arthropathy involving the interphalangeal joint and metatarsal phalangeal joint of the first digit as well as the DIP and PIP joints of the remaining digits. There is moderate to severe hallux valgus as well as bunion formation. The moderate degenerative changes are noted in the midfoot. There is no acute osseous injury. 1. Diffuse degenerative changes without evidence of acute osseous injury. There is no evidence of bony erosion. Reported And Signed By: Merdis Delay, MD  [facility] Radiology and Biomedical Imaging MRI Total Spine w wo IV ContrastResult Date: 3/5/2023MRI TOTAL SPINE W WO IV CONTRAST([facility]) performed on 02/23/2022 11:56 AM INDICATION: Pt with recent prolonged hospitalization and multiple infections, now with LE weakness of unclear etiology. Please eval for any spinal collections/etiology to explain progressive weakness. COMPARISON: Outside study MR cervical spine without contrast 01/02/2022 and MR thoracic and lumbar spine without contrast 01/04/2022 TECHNIQUE: MRI of the total spine without and with contrast was performed after administration of  15  cc of Dotarem intravenously.  FINDINGS: Images are degraded by motion artifacts, particularly the axial T2 and the postcontrast sequences. Cervical Spine: Visualized posterior fossa is within normal limits. Minimal retrolisthesis C6 on C7 again noted. Loss of lordosis similar mild anterior wedging of C5 and C6, likely chronic related to degenerative disease. Vertebral body heights and alignment are otherwise normal. The spinal cord is grossly normal in caliber and signal. No definite abnormal enhancement intrathecal enhancement is seen. Mild perifacet edema or enhancement is identified. Small focus of enhancement in the cord at C3-C4 level is likely artifactual. Evaluation for cord T2 signal changes is substantially degraded by motion. Loss of disc space height and T2 signal again noted at C5-C6 and C6-7 compatible with disc desiccation. C3/4: There are left uncovertebral osteophytes with facet arthropathy likely resulting in moderate left neural foraminal stenosis. C4/5: There is no obvious significant canal or neural foramen stenosis and mild disc bulging and mild right-sided facet arthropathy. C5/6: Posterior disc osteophyte complex with ligamentum flavum thickening resulting in mild spinal canal stenosis bilateral uncovertebral osteophytes, more on the left with facet arthropathy resulting in moderate right and severe left neural foraminal stenosis. C6/7: Posterior disc osteophyte complex with ligamentum flavum thickening resulting in mild spinal canal stenosis.  Bilateral uncovertebral osteophytes and facet hypertrophy causing severe bilateral neural foraminal stenosis. Thoracic Spine: There is scoliotic curvature of the thoracic spine with convexity to the left superiorly and to the right inferiorly. Slight exaggerated thoracic kyphosis The vertebral body heights maintained. Vertebral marrow signal is normal. No abnormal intrathecal enhancement is seen. Cord signal is grossly normal. No significant canal compromise. Scoliosis and right-sided facet arthropathy results in mild right neural foramen stenosis at T3-T4, T4-T5 and T5-T6 Lumbar Spine: There is scoliotic curvature of the lumbar spine with convexity to the left. Vertebral body height and marrow signal are within normal limits. There is no signal abnormality within the spinal cord. The conus terminates at L1. No abnormal intrathecal enhancement is seen. Loss of disc space height and T2 signal L2-3 through L5-S1 compatible with disc desiccation. No suspicious findings to suggest discitis/osteomyelitis. L2/3: There is diffuse disc bulge and mild facet arthropathy resulting in mild spinal canal and mild bilateral neural foraminal stenosis. L3/4: There is diffuse disc bulge, ligamentum flavum thickening and facet arthropathy without significant canal stenosis. A right subarticular-foraminal disc protrusion again appears to be contacting the right L4 nerve root lateral recess and the right L3 nerve root neural foramen.. Left foraminal annular fissure again noted. L4/5: There is diffuse disc bulge with ligamentum flavum thickening, facet arthropathy and left foraminal disc protrusion resulting in mild spinal canal stenosis and moderate bilateral neural foraminal stenosis, worse on the left and contact with the left L4 nerve root, probably unchanged. Mild bilateral perifacet edema again noted as well as mild perifacet enhancement suggesting some associated inflammation L5/S1: There is disc space narrowing, disc-osteophyte complex asymmetric on the left again results in bilateral neural foramen stenosis worse on the left and contact with the left L5 nerve root in the neural foramen and left S1 nerve root lateral recess Note is made of a balloon-retained retained gastrostomy. There is a suggestion of a 4 cm calcified uterine fibroid on the localizing images. Note is made of colonic diverticulosis. Mild atrophy of the lumbar paraspinal muscles. Increased T2 signal and enhancement is noted diffusely throughout the posterior paraspinal muscles without associated soft tissue swelling could be related to denervation or inflammation. 1. Scoliotic deformity of the thoracolumbar spine. 2. 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 3. No obvious epidural abscess or paraspinal collection. No findings suspicious for discitis/osteomyelitis. Mild perifacet inflammatory changes identified at L4-5, likely degenerative. Mild increased T2 signal and enhancement within the posterior paraspinal muscles diffusely throughout the lumbar spine could be related to denervation or inflammation. Examination is significantly degraded by patient motion and sagittal T2 signal changes within the spinal cord or abnormal intrathecal enhancement may not be apparent on this examination. Examination to be repeated with sedation if clinically indicated. Report Initiated By:  Neta Mends, MD Reported And Signed By: Aviva Kluver, MD  [facility] Radiology and Biomedical Imaging IR Gastrointestinal proceduresResult Date: 4/3/2023PROCEDURE: Replacement of gastrostomy feeding tube. DATE OF PROCEDURE: 03/21/2022 4:58 PM INDICATION:Danielle [name] is a 66 y.o. female with recent prolonged hospitalization for perforated diverticulitis, G tube placement at OSH (01/24/22), which was accidentally dislodged. A Foley catheter had been placed in the tract to maintain its patency. PHYSICIANS: Dr. Brunetta Jeans M.D., the attending physician, was present for the procedure and its imaging.  Resident: Baldomero Lamy M.D. MEDICATIONS: None. FLUOROSCOPY TIME: 0.3 minutes. TECHNIQUE AND FINDINGS: Informed written consent was obtained. The patient was then brought to the procedure suite, placed in the supine position,  and a timeout was performed. The patient's anterior abdomen and indwelling Foley catheter in the gastrostomy site were prepped and draped in standard sterile fashion. A scout image was obtained, demonstrating the tube to be in expected position. Contrast injection was then performed demonstrating the tube tip to be well-positioned within the lumen of the stomach. A 0.035 inch Amplatz wire was advanced through the catheter and coiled in the stomach. The balloon was deflated, and the catheter was removed over the Amplatz wire. A new 24 French MIC gastrostomy tube was then advanced over the wire under fluoroscopic guidance, and its tip situated within the gastric lumen. The balloon was inflated with 9 cc of sterile water, the catheter pulled back up against the stomach wall, and contrast injection performed to confirm proper positioning of the tube tip. The external fixation disc was slid down the shaft of the tubing and fixed into place. A sterile dressing was applied. The patient tolerated the procedure well without any complication and was transferred to the recovery area in stable condition. Successful gastrostomy replacement using a MIC 24 French gastrostomy tube. The tube is ready for immediate use. The tube should be flushed twice daily with 15 cc Tap Water. Report Initiated By:  Thana Farr, MD Reported And Signed By: Brunetta Jeans, MD  [facility] Radiology and Biomedical Imaging ECG/Tele Events: No ECG ordered today Assessment Assessment: Assessment:This is a 66 y.o. female PMHx of RA, recent prolonged hospitalization for perforated diverticulitis (s/p ex Lap/R hemicolectomy with end ileostomy) admitted for persistent encephalopathy/FTT and likely need for LTACH with work up revealing fever with unclear etiology (resolved). Has undergone extensive work-up of encephalopathy and leg weakness which is unremarkable. Course c/b elevated LFTs which resolved, likely 2/2 DILI from Ceftriaxone or Zosyn. Mental status appears overall stable. Plan Plan: # ACUTE PROBLEMS# Dislodged PEG tube - PEG replacement by IR  3/31- TF resumed after that which was well tolerated. - Overnight of 4/2 , patient was reported to have pulled her G-tube.  Repeated abdominal x-ray revealed G-tube in the stomach however recommended repeat KUB with contrast through the tube to determine terminal and position. - Poor po intake over all approx 25% - cleared by SLP for pureed diet and thin liq?# Altered mental status / LE weakness - MS improved - Unclear etiology, possibly prolonged delirium iso hospitalization from Nov 2022 - currentMRI brain, MRI total spine unremarkable- LP unremarkable- Paraneoplastic and autoimmune encephalopathy panels negative- Neurology rec'd EMG to complete work-up of LE weakness but pt declined; Neurology does not recommend any further testing and signed off 02/26/22?# Right foot cellulites with fever - transient fever resolved, no leukocytosis - CXR neg, UA neg. CRP 25.8, procal neg. Xray feet with degenerative changes but no acute findings - Suspected source was R foot cellulitis- S/P CTX 2/21 and Zosyn 2/21-2/25; S/P Empiric ABX-Oxacillin --stopped with negative infectious work-up- Consulted Podiatry--- WBAT to BLE.No?indication for podiatric surgical intervention at this time. ?- Patient may follow-up with Dr. Gazes within 3-4 weeks of discharge.?- Trend fever/CBC- Tylenol prn?# Hypercalcemia - Now resolved- Per Endocrinology assessment :?	Hypercalcemia in the setting of PTH that is in mid reference range ( 28 -30) and elevated x 2 mid molecule PTH. ??	PTH is higher than would be expected for a non-PTH mediated mechanism of hypercalcemia. 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	Taking into consideration clinical scenario, hypercalcemia of immobilization is very likely contributing and probably the leading player in the  etiology of hypercalcemia. ?	Risk of hypocalcemia exists but probably low in this patient. No additional recommendations. - S/P Zolendronic acid 4 mg X1 (3/21)- Trend Ca and Albumin as needed while IP?#  Abnormal LFTS ( resolved ) - attributed to DILI from antibiotics earlier - now resolved # Dysphagia - SLP eval 3/27 cleared for puree and thin liquids- Re-eval for diet upgrade later- resume TFs- FWF- Calorie counts- Requires 1: 1 feeding assistance- Aspiration precautions# CHRONIC PROBLEMS # RA Stable , Per Rheumatology evaluation : ?	No indication for additional immunosuppressive therapy. ?	Cont to hold MTX. ?	Use artifical tears PRN for dry eyes. ?	Mouth hygiene. If c/o dry mouth, can start on cevimeline 30 mg TID. ?	Will need to arrange follow up in [facility] Rheumatology clinic.# Recent diagnosis of DVT Per chart review, this was diagnosed incidentally on 2/1 [facility] abdomen imaging ( Incidental note of expansile, hypodense thrombus within the bilateral common femoral veins, with fat stranding about the vessels. 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 Cytology of peritoneal nodules at that time as well was negative for malignancy as well. Duration of therapy remains unclear so far - she needs hematology follow up in the outpatient setting for further discussion. FULL CODE lovenox Tube feeds with tray ( dysphagia diet ) dispo : Awaiting T19-conservatorship 20 minutes spent in this encounter, >50% spent evaluating patient, reviewing labs/imaging, counseling with patient about plan of care, answering questions, and coordination of care.Electronically Signed:Balthazar Dooly A. Marquis Lunch, MD  Internal Medicine Hospitalist AttendingYale Vibra Hospital Of [facility] LLC Contact via : [facility] or Smart Web4/02/2022, 2:18 PM

## 2022-03-24 NOTE — Plan of Care
Adult Speech and Language PathologySwallow Treatment Session4/3/2023Patient Name:  Danielle MaloneMR#:  [name] Date of Birth:  11/22/1957Therapist:  Rachel Moulds, SLP SLP IP Adult Pain/Comfort - 03/24/22 1109    Pain/Comfort  Pain Comment (Pre/Post Treatment Pain) No c/o pain      SLP Swallow Treatment Objective - 03/24/22 1109    *Treatment Objective  Treatment Objective Tolerate PO regimen      SLP Swallow Treatment Session Activities - 03/24/22 1109    Treatment Session Activities  Treatment Session Activities Swallow Treatment      Dysphagia Treatment - 03/24/22 1109    Dysphagia/Swallow Treatment  Dysphagia Treatment Swallowing   Dysphagia Treatment Comments Pt seen for FEES on 3/27 with recommendations for puree with thin liquids. Pt refusing trials of advanced solids at this time. Pt seen today for tx for diet tolerance and assess readiness for solid advancement. Per chart pt tolerating diet well but with poor  PO intake. At this time she was given sips of water and yogurt. Pt took 4 sips of water and two 1/2 teaspoons of yogurt. Pt refusing additional trials at this time. Oral stage notable for adequate bolus control, timely AP transit, and oral clearance. Pharyngeal phase notable for one swallow trigger per bolus. Pt tolerating current diet well. SLP to continue to follow for diet tolerance and ongoing assessment for readiness of solid advancement.   Dysphagia Follow Up Patient is tolerating current PO regimen without overt signs/symptoms of dysphagia   Dysphagia Recommendations Continue current diet;Continue with aspiration precautions   Medication Administration via feeding tube   Dysphagia Plan of Care Continue to follow for diet tolerance      SLP IP Adult Prognosis - 03/24/22 1109    Speech Therapy Prognosis  Services Skilled SLP services to address the above deficits      SLP IP Adult Recommendations - 03/24/22 1109    Recommendations  SLP Therapy Frequency 1x per week   Recommendations discussed with: nurse;team;patient      SLP IP Adult Inpatient Recommendations - 03/24/22 1109    SLP Recommendations for Inpatient Admission  Swallow Recommendations REC: 1. Puree with thin liquids 2. Meds crushed in puree 3. Aspiration precautions 4. Oral care 2-3x a day 5. Re-consult for solids upgrade (can be done clinically at bedside) or if acute concerns arise.      SLP IP Adult Discharge Summary - 03/24/22 1109    Discharge Summary  Disposition Recommendation(s) To be determined pending progress     Problem: Speech Language Pathology GoalsGoal: Dysphagia Goals, SLPDescription: Pt will tolerate pureed diet with thin liquids w/o s/x of aspiration or adverse impact on respiratory status.Pt will participate in clinical upgrade of solids w/o s/sx of aspiration or adverse impact on respiratory status. Outcome: Interventions implemented as appropriate Rachel Moulds, M.S.,CF-SLPMHB: [phone]/02/2022

## 2022-03-24 NOTE — Plan of Care
Plan of Care Overview/ Patient Status    1900 -0700 Neuro: A&Ox 1Resp: WDL on room airCardiac:WDL, not on tele GI/GU: ileostomy L & R, G tube, patent, flowing to gravity. Pt required frequent redirection r/t pulling of lines. Ileostomy bags required replacement after repeated removal of them this shift. Covering provider Deepti Chanchi notified of potential Gtube displacement; Gtube protruding 2-3 cm. Xray was ordered/taken to confirm placement of Gtube.MS:Ax2, bedfastSkin:See flowsheetsDiet: Pureed, thin liquids, pills whole/gtubePain: None indicated/reported Scheduled medications were administered per MAR, tolerated well. Hourly rounds and q2 hr turn and repositioning performed. Pt required reminders to refrain from manipulating her ostomy bags and gastric tube. Safety maintained, call bell within reach bed alarm engaged.

## 2022-03-24 NOTE — Plan of Care
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   General Observations Pt received supine in bed; on RA; NAD; ostomy; purwick; B podus boots; RN cleared   Precautions/Limitations Fall Precautions;Bed alarm;Chair alarm   Precautions/Limitations Comment WBAT BLE; Pureed diet/thin liquids    Weight Bearing Status  Weight Bearing Status Comments WBAT BLE    Vital Signs and Orthostatic Vital Signs  Vital Signs Vital Signs Stable   Vital Signs Free text RA; NAD    Pain/Comfort  Pain Comment (Pre/Post Treatment Pain) No c/o pain pre/post session; anxious w/ mobilization    Patient Coping  Observed Emotional State accepting;anxious;cooperative   Verbalized Emotional State acceptance    Cognition  Overall Cognitive Status Impaired   Orientation Level Oriented to person   Level of Consciousness alert   Following Commands Follows one step commands with increased time;Follows one step commands with repetition   Personal Safety / Judgment Fall risk;Requires supervision with mobility   Attention Requires redirection;Requires cues    Range of Motion  Range of Motion Comments Tight B HM and heel cords; performed PROM seated EOB    Manual Muscle Testing  Manual Muscle Testing Comments + Deconditioned    Skin Assessment  Skin Assessment See Nursing Documentation    Balance  Sitting Balance: Static  POOR??????Moderate assist to maintain static position with no Assistive Device   Sitting Balance: Dynamic  POOR-????Unable to move from midline voluntarily, maximal assist to right self   Balance Assist Device Hand held assist   Balance Skills Training Comment Ax2; retropulsive w/ R lateral lean seated EOB    Bed Mobility Supine-to-Sit Independence/Assistance Level Maximum assist;Assist of 2   Supine-to-Sit Assist Device Head of bed elevated;Hand held assist   Sit-to-Supine Independence/Assistance Level Maximum assist;Assist of 2   Sit-to-Supine Assist Device Hand held assist;Head of bed elevated;Draw pad   Bed Mobility Comments Retropulsive, unable to upright self; boosted end of session    Sit-Stand Transfer Training  Sit-Stand Transfer Comments Deferred d/t increased need for assist    Handoff Documentation  Handoff Patient in bed;Bed alarm;Patient instructed to call nursing for mobility;Discussed with nursing    Endurance  Endurance Comments Fair-    PT- AM-PAC - Basic Mobility Screen- How much help from another person do you currently need.....  Turning from your back to your side while in a a flat bed without using rails? 1 - Total - Requires total assistance or cannot do it at all.   Moving from lying on your back to sitting on the side of a flat bed without using bed rails? 1 - Total - Requires total assistance or cannot do it at all.   Moving to and from a bed to a chair (including a wheelchair)? 1 - Total - Requires total assistance or cannot do it at all.   Standing up from a chair using your arms(e.g., wheelchair or bedside chair)? 1 - Total - Requires total assistance or cannot do it at all.   To walk in a hospital room? 1 - Total - Requires total assistance or cannot do it at all.   Climbing 3-5 steps with a railing? 1 - Total - Requires total assistance or cannot do it at all.   AMPAC Mobility  Score 6   TARGET Highest Level of Mobility Mobility Level 2, Turn self in bed/bed activity/dependent transfer    ACTUAL Highest Level of Mobility Mobility Level 3, Sit on edge of bed    Therapeutic Exercise  Therapeutic Exercise Comments AROM x 4; PROM BLE    Neuromuscular Re-education  Neuromuscular Re-education Comments Static seated postural control training Therapeutic Functional Activity  Therapeutic Functional Activity Comments Bed mobility; Pt edu    Clinical Impression  Follow up Assessment Pt seen for PT f/u. Required maxAx2 for supine<>seated EOB. Continues to be retropulsive w/ R lateral lean seated EOB. Tactile and VCs for seated upright, unable to right self. Performed PROM to BLE for improved muscle length. Will benefit from skilled services to improve gross functional ROM/strength, static/dynamic postural control, and for improved activity tolerance. Current PT d/c rec is STR.   Criteria for Skilled Therapeutic Interventions Met yes    Patient/Family Stated Goals  Patient/Family Stated Goal(s) feel better    Frequency/Equipment Recommendations  PT Frequency 2x per week   What day of week is next treatment expected? Thursday   4/6  PT/PTA completing this assessment RL   Equipment Needs During Admission/Treatment No device    PT Recommendations for Inpatient Admission  Activity/Level of Assist mechanical lift    Planned Treatment / Interventions  Plan for Next Visit Progress as tol   Training Treatment / Interventions Balance / Gait Training;Endurance Training;Functional Personnel officer / Interventions Patient Education / Training    PT Discharge Summary  Physical Therapy Disposition Recommendation Short Term Rehab     Shanon Payor PT, [place]: [phone]

## 2022-03-25 NOTE — Progress Notes
Called to instill omnipaque into peg tube for xray- assessed tube-out about 3cm- Stomach contents was pulled back- 20ml was instilled for xray, then removed when done.Pt tolerated very well

## 2022-03-25 NOTE — Progress Notes
[name]	Podiatry Progress NoteDate: 4/4/2023Patient Name: Danielle [name]: [id number] Attending Provider: Tresea Mall, MDCC: Bilateral foot woundsSubjective: Pt seen at bedside this a.m. with attending in NAEO.  NAD. Patient non-verbal. PMH: PSH: No past medical history on file. No past surgical history on file. Social History: Family History: Social History Tobacco Use ? Smoking status: Not on file ? Smokeless tobacco: Not on file Substance Use Topics ? Alcohol use: Not on file  No family history on file. Allergies: Patient has no known allergies.Review of Systems: Unable to assess 2/2 AMSVitals: Vitals:  03/25/22 1525 BP: 103/70 Pulse: (!) 99 Resp: 20 Temp: 97.4 ?F (36.3 ?C) Physical Exam: Gen: NADHead: NormocephalicPsych: AAO x 2Chest: CTAB, no r/r/wCardio: RRR, S1, S2, no m/r/gAbd: Soft NT, ND, +bsVascular: DP/PT pulses palpable. Normal CRT to all digits. Foot is warm to touch. Pedal hair growth is absentNeuro: Unable to assess MSK: Unable to ROM digits when asked, unable to participate in MMTDerm: Deep tissue injury noted to the Right lateral foot without any open lesions. No drainage, purulence, tunneling, crepitus, fluctuance, malodor, proximal lymphagitic streaking, or other acute clinical signs of infection. DTI shape size and look very similar to prior physical examLabs: Lab Results Component Value Date  WBC 5.9 03/23/2022  HGB 11.4 (L) 03/23/2022  HCT 36.20 03/23/2022  CREATININE 0.32 (L) 03/23/2022  BUN 10 03/23/2022  CO2 23 03/23/2022  PLT 417 03/23/2022  INR 1.10 02/18/2022  GLU 98 03/23/2022  K 3.8 03/23/2022  NA 136 03/23/2022  CL 103 03/23/2022  ALT 17 03/15/2022  AST 26 03/15/2022  TSH 1.670 02/17/2022  HSCRP 25.8 (H) 03/14/2022  SEDRATE 98 (H) 02/19/2022 Imaging: XR Foot Bilateral AP Lateral and ObliqueResult Date: 3/7/2023AP, LATERAL AND OBLIQUE BILATERAL FOOT X-RAY Clinical Information: Baseline assessment. Assess for erosions. Comparison: None Findings: Left foot: The overall bony mineralization appears qualitatively diminished. There is evidence of joint space narrowing indicative of arthropathy involving the interphalangeal joint and metatarsal phalangeal joint of the first digit as well as the DIP and PIP joints of the remaining digits. There is mild hallux valgus as well as bunion formation. The moderate degenerative changes are noted in the midfoot. There is no acute osseous injury. Right foot: The overall bony mineralization appears qualitatively diminished. There is evidence of joint space narrowing indicative of arthropathy involving the interphalangeal joint and metatarsal phalangeal joint of the first digit as well as the DIP and PIP joints of the remaining digits. There is moderate to severe hallux valgus as well as bunion formation. The moderate degenerative changes are noted in the midfoot. There is no acute osseous injury. 1. Diffuse degenerative changes without evidence of acute osseous injury. There is no evidence of bony erosion. Reported And Signed By: Merdis Delay, MD  [facility] Radiology and Biomedical [facility] Micro/Path: None Assessment/Plan: Danielle [name] is a 66 y.o. female with Right lateral foot and and resolved Left medial eminence deep tissue injuries, stable. No open wounds or SOI. ?- Revaluated patient's wound with attending today.  Lateral eschar continues to be very stable at this time.  No open wounds around the deep tissue injury, no crepitus fluctuance or drainage noted.  No other clinical signs of infection noted. Mild periwound erythema most likely due to pressure rather than infection. - Dressed with betadine allevyn - At this time the foot is not the cause of the patient's low-grade fever.- Would recommend broadening differential to urinary or GI causes for low-grade fever.- No dressing needed on the left medial eminence- Appreciate nursing continuing  With a Betadine with overlying DSD to the right DTI- Recommend WBAT to BLE-Will defer antibiotics per primary team - none indicated per podiatry standpointDispo: No indication for podiatric surgical intervention at this time.  Patient may follow-up with Dr. Gazes within 3-4 weeks of discharge.  Podiatry will continue to follow peripherally while in house. Please contact the resident on call with any acute changes, new issues, or questions.Seen with attending Dr. Darral Dash page on-call podiatry resident with questions:?	SEE AMION FOR CALL SCHEDULE Electronically Signed by Audrie Gallus, DPM, March 25, 2022

## 2022-03-25 NOTE — Plan of Care
Plan of Care Overview/ Patient Status    1100-1900 hrs  Pt Alert and confused, Direct able.  VSS tolerating RA.  Pt w/ PEG tube, bolus feeds  a/o, and puree tray, assist feed, poor appetite,  Supplements a/o. Assist feed.  Ileostomy patent w/ liquid stool, one piece appliance intact.  L mucous stroma w/o drainage.   Incontinent of urine,    Assist of 1-2 in bed.   Pt w/ general; weakness, fall precautions w/ bed alarm in use.  Will monitor.

## 2022-03-25 NOTE — Plan of Care
Plan of Care Overview/ Patient Status    A/Ox1, self only. Pt confused at baseline. VSS, RA, incontinent x2, however has R iliostomy ( output this shift) and R mucous drain (no output this shift). Pt also has PEG tube, however HS RN reported pt had pulled at lines and tubes overnight which required abdominal xray to confirm proper placement which was confirmed. Pt had family visit today, bedside. Full bedbath, gown, linen and incontinence care provided. Q2 turn and hourly rounding provided. Safety maintained, fall bundle in place. Problem: Adult Inpatient Plan of CareGoal: Plan of Care ReviewOutcome: Interventions implemented as appropriate

## 2022-03-25 NOTE — Plan of Care
Plan of Care Overview/ Patient Status    1900-0730- AO to self, pleasant. No acute changes overnight. Ostomy appliance changed. Incontinence care provided, linens,gown changed. Xray with contrast obtained, placement for PEG is good, in stomach. Jevity bolus as ordered.VSS on RA, hourly rounding by staff.Loosely placed Abd binder, cutting out a hole for the illeostomy bag and covering the PEG in attempt to hide PEG tube and prevent Pt from pulling it out.

## 2022-03-25 NOTE — Progress Notes
[facility] Medicine Progress NoteAttending Provider: Tresea Mall, MD Subjective                                                                              Subjective: Interim History: Denies any new complaints as Chest Pain - SOB - Abdominal pain -Repeated abdominal x-ray with intra tube contrast  revealed G-tube in the stomach with correct position. Tube feeds was resumed. Review of Allergies/Meds/Hx: Review of Allergies/Meds/Hx:I have reviewed the patient's: current scheduled medicationsScheduled Meds:Current Facility-Administered Medications Medication Dose Route Frequency Provider Last Rate Last Admin ? [Held by provider] apixaban (ELIQUIS) tablet 5 mg  5 mg Per G Tube Q12H Littie Deeds, MD     ? Jerrel Ivory Plus oral powder packet  1 packet Per G Tube BID Charlett Blake, MD   1 packet at 03/25/22 1034 ? chlorhexidine gluconate (PERIDEX) 0.12 % solution 15 mL  15 mL Mouth/Throat BID Dorisann Frames, MD   15 mL at 03/25/22 0956 ? Elemental iron 60 mg (300 mg Ferrous sulfate) / 5 mL oral liquid 45 mg  45 mg Per G Tube BID Peter Congo, Adil, MD   45 mg at 03/25/22 0956 ? enoxaparin (LOVENOX) injection 80 mg  80 mg Subcutaneous Q12H Chaar, Abdelkader, MD   80 mg at 03/25/22 0956 ? famotidine (PEPCID) suspension (8 mg/mL) 20 mg  20 mg Per G Tube Daily Peter Congo, Adil, MD   20 mg at 03/25/22 0956 ? folic acid (FOLVITE) tablet 1 mg  1 mg Per G Tube Daily Bhutta, Adil, MD   1 mg at 03/25/22 0254 ? niacin Immediate Release tablet 100 mg  100 mg Per G Tube Nightly Bhutta, Adil, MD     ? nystatin (MYCOSTATIN) powder 1 Application  1 Application Topical (Top) BID Henriette Combs, MD   1 Application at 03/25/22 1034 ? pyridoxine (vitamin B6) (B-6) tablet 100 mg  100 mg Per G Tube Daily Peter Congo, Adil, MD   100 mg at 03/25/22 0956 ? [Held by provider] rosuvastatin (CRESTOR) tablet 10 mg  10 mg Per G Tube Nightly Dorisann Frames, MD 10 mg at 02/14/22 2245 ? sodium chloride 0.9 % flush 3 mL  3 mL IV Push Q8H Dorisann Frames, MD   3 mL at 03/23/22 2230 ? thiamine (VITAMIN B1) tablet 100 mg  100 mg Per G Tube BID Peter Congo, Adil, MD   100 mg at 03/25/22 0956 Continuous Infusions:PRN Meds:.acetaminophen, dextrose (GLUCOSE) oral liquid 15 g **OR** fruit juice **OR** skim milk, dextrose (GLUCOSE) oral liquid 30 g **OR** fruit juice, dextrose injection, dextrose injection, glucagon, iohexol **AND** [COMPLETED] XR Abdomen AP, ondansetron **OR** ondansetron (ZOFRAN) IV Push, sodium chloride Objective Objective: Vitals:Last 24 hours: Temp:  [97.4 ?F (36.3 ?C)-99 ?F (37.2 ?C)] 97.4 ?F (36.3 ?C)Pulse:  [71-99] 99Resp:  [16-20] 20BP: (101-117)/(65-80) 103/70SpO2:  [96 %-100 %] 99 %I/O's:Gross Totals (Last 24 hours) at 03/25/2022 1802Last data filed at 03/25/2022 1724Intake 1408 ml Output 625 ml Net 783 ml Procedures:None Physical Exam General: alert but confused at times. No distress. HENT: Head: Normocephalic and atraumatic. Eyes: Pupils are equal, round, and reactive to light. EOM are normal. Neck: Normal range of motion. Neck supple. Cardiovascular:  Normal rate and regular rhythm. Normal S1 and S2 - No murmurs/galops/rubs.Pulmonary/Chest: Effort normal and breath sounds normal. No wheezes/rhonci/rales. Abdominal: Soft. Bowel sounds are normal. There is no abdominal tenderness. No CVA tenderness. Neurological: No focal sensory or motor deficit. Extremities: No Lower extremity edema  Labs:[facility] 04/02/230514 WBC 5.9 HGB 11.4* HCT 36.20 MCV 84.0 PLT 417 BMPRecent Labs Lab 04/02/230514 NA 136 K 3.8 CL 103 CO2 23 ANIONGAP 10 BUN 10 CREATININE 0.32* CALCIUM 10.3* LFTsNo results for input(s): ALT, AST, ALKPHOS, BILITOT, BILIDIR, ALBUMIN in the last 168 hours.CoagsNo results for input(s): LABPROT, PTT, INR in the last 168 hours.ANEMIA STUDIESNo results for input(s): FERRITIN, IRON, TIBC, IRONSAT, VITB12 in the last 168 hours.GLUCOSERecent Labs Lab 04/02/230514 GLU 98 A1CNo results found for: HGBA1CMICROBIOLOGYNo results for input(s): LABBLOO, LABURIN, LOWERRESPIRA in the last 168 hours.Diagnostics:XR Chest PA or AP (Portable)Result Date: 3/24/2023XR CHEST PA OR AP Date: 03/14/2022 7:56 AM INDICATION: fever, possible cough COMPARISON: XR CHEST PA OR AP 2023-Feb-21 FINDINGS: Lungs and Pleura: No focal consolidations. No pulmonary edema. No pleural effusion or pneumothorax. Heart and Mediastinum:  Cardiomediastinal contours are unchanged.  No consolidations to suggest pneumonia. Reported And Signed By: Leilani Merl, MD  [facility] Radiology and Biomedical Imaging XR Abdomen APResult Date: 4/3/2023Clinical indication: Evaluate tube location. Findings and impression: Single view of the abdomen is compared to exam performed earlier today. The pelvis is excluded. A PEG catheter was injected with contrast, and contrast fills the stomach. There is no bowel obstruction. Reported And Signed By: Gary Angola, MD  [facility] Radiology and Biomedical Imaging XR Abdomen APResult Date: 4/3/2023Study: XR ABDOMEN AP Indication: g tube positioning Comparison: None Findings and Impression: A single view abdominal radiograph was obtained. A feeding tube overlies the abdomen at the expected location of stomach, but unknown in its precise location/lumen termination. Consider repeating the exam after administering contrast through the tube to determine location. Report Initiated By:  Orlie Pollen, MD Reported And Signed By: Elray Buba, MD  [facility] Radiology and Biomedical Imaging XR Foot Bilateral AP Lateral and ObliqueResult Date: 3/7/2023AP, LATERAL AND OBLIQUE BILATERAL FOOT X-RAY Clinical Information: Baseline assessment. Assess for erosions. Comparison: None Findings: Left foot: The overall bony mineralization appears qualitatively diminished. There is evidence of joint space narrowing indicative of arthropathy involving the interphalangeal joint and metatarsal phalangeal joint of the first digit as well as the DIP and PIP joints of the remaining digits. There is mild hallux valgus as well as bunion formation. The moderate degenerative changes are noted in the midfoot. There is no acute osseous injury. Right foot: The overall bony mineralization appears qualitatively diminished. There is evidence of joint space narrowing indicative of arthropathy involving the interphalangeal joint and metatarsal phalangeal joint of the first digit as well as the DIP and PIP joints of the remaining digits. There is moderate to severe hallux valgus as well as bunion formation. The moderate degenerative changes are noted in the midfoot. There is no acute osseous injury. 1. Diffuse degenerative changes without evidence of acute osseous injury. There is no evidence of bony erosion. Reported And Signed By: Merdis Delay, MD  [facility] Radiology and Biomedical Imaging IR Gastrointestinal proceduresResult Date: 4/3/2023PROCEDURE: Replacement of gastrostomy feeding tube. DATE OF PROCEDURE: 03/21/2022 4:58 PM INDICATION:Danielle [name] is a 66 y.o. female with recent prolonged hospitalization for perforated diverticulitis, G tube placement at OSH (01/24/22), which was accidentally dislodged. A Foley catheter had been placed in the tract to maintain its patency. PHYSICIANS: Dr. Brunetta Jeans M.D., the attending physician, was  present for the procedure and its imaging.  Resident: Baldomero Lamy M.D. MEDICATIONS: None. FLUOROSCOPY TIME: 0.3 minutes. TECHNIQUE AND FINDINGS: Informed written consent was obtained. The patient was then brought to the procedure suite, placed in the supine position, and a timeout was performed. The patient's anterior abdomen and indwelling Foley catheter in the gastrostomy site were prepped and draped in standard sterile fashion. A scout image was obtained, demonstrating the tube to be in expected position. Contrast injection was then performed demonstrating the tube tip to be well-positioned within the lumen of the stomach. A 0.035 inch Amplatz wire was advanced through the catheter and coiled in the stomach. The balloon was deflated, and the catheter was removed over the Amplatz wire. A new 24 French MIC gastrostomy tube was then advanced over the wire under fluoroscopic guidance, and its tip situated within the gastric lumen. The balloon was inflated with 9 cc of sterile water, the catheter pulled back up against the stomach wall, and contrast injection performed to confirm proper positioning of the tube tip. The external fixation disc was slid down the shaft of the tubing and fixed into place. A sterile dressing was applied. The patient tolerated the procedure well without any complication and was transferred to the recovery area in stable condition. Successful gastrostomy replacement using a MIC 24 French gastrostomy tube. The tube is ready for immediate use. The tube should be flushed twice daily with 15 cc Tap Water. Report Initiated By:  Thana Farr, MD Reported And Signed By: Brunetta Jeans, MD  [facility] Radiology and Biomedical Imaging ECG/Tele Events: No ECG ordered today Assessment Assessment: Assessment:This is a 66 y.o. female PMHx of RA, recent prolonged hospitalization for perforated diverticulitis (s/p ex Lap/R hemicolectomy with end ileostomy) admitted for persistent encephalopathy/FTT and likely need for LTACH with work up revealing fever with unclear etiology (resolved). Has undergone extensive work-up of encephalopathy and leg weakness which is unremarkable. Course c/b elevated LFTs which resolved, likely 2/2 DILI from Ceftriaxone or Zosyn. Mental status appears overall stable. Plan Plan: # ACUTE PROBLEMS# Dislodged PEG tube - PEG replacement by IR  3/31- TF resumed after that which was well tolerated. - Overnight of 4/2 , patient was reported to have pulled her G-tube.  Repeated abdominal x-ray revealed G-tube in the stomach however recommended repeat KUB with contrast through the tube to determine terminal and position.  Repeated abdominal x-ray with intra tube contrast  revealed G-tube in the stomach with correct position. Tube feeds was resumed.- Poor po intake over all approx 25% - cleared by SLP for pureed diet and thin liq?# Altered mental status / LE weakness - MS improved - Unclear etiology, possibly prolonged delirium iso hospitalization from Nov 2022 - currentMRI brain, MRI total spine unremarkable- LP unremarkable- Paraneoplastic and autoimmune encephalopathy panels negative- Neurology rec'd EMG to complete work-up of LE weakness but pt declined; Neurology does not recommend any further testing and signed off 02/26/22?# Right foot cellulites with fever - transient fever resolved, no leukocytosis - CXR neg, UA neg. CRP 25.8, procal neg. Xray feet with degenerative changes but no acute findings - Suspected source was R foot cellulitis- S/P CTX 2/21 and Zosyn 2/21-2/25; S/P Empiric ABX-Oxacillin --stopped with negative infectious work-up- Consulted Podiatry--- WBAT to BLE.No?indication for podiatric surgical intervention at this time. ?- Patient may follow-up with Dr. Gazes within 3-4 weeks of discharge.?- Trend fever/CBC- Tylenol prn?# Hypercalcemia - Now resolved- Per Endocrinology assessment :?	Hypercalcemia in the setting of PTH that is in mid reference range (  28 -30) and elevated x 2 mid molecule PTH. ??	PTH is higher than would be expected for a non-PTH mediated mechanism of hypercalcemia. 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	Taking into consideration clinical scenario, hypercalcemia of immobilization is very likely contributing and probably the leading player in the etiology of hypercalcemia. ?	Risk of hypocalcemia exists but probably low in this patient. No additional recommendations. - S/P Zolendronic acid 4 mg X1 (3/21)- Trend Ca and Albumin as needed while IP?#  Abnormal LFTS ( resolved ) - attributed to DILI from antibiotics earlier - now resolved # Dysphagia - SLP eval 3/27 cleared for puree and thin liquids- Re-eval for diet upgrade later- resume TFs- FWF- Calorie counts- Requires 1: 1 feeding assistance- Aspiration precautions# CHRONIC PROBLEMS # RA Stable , Per Rheumatology evaluation : ?	No indication for additional immunosuppressive therapy. ?	Cont to hold MTX. ?	Use artifical tears PRN for dry eyes. ?	Mouth hygiene. If c/o dry mouth, can start on cevimeline 30 mg TID. ?	Will need to arrange follow up in [facility] Rheumatology clinic.# Recent diagnosis of DVT Per chart review, this was diagnosed incidentally on 2/1 [place] abdomen imaging ( Incidental note of expansile, hypodense thrombus within the bilateral common femoral veins, with fat stranding about the vessels. 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 Cytology of peritoneal nodules at that time as well was negative for malignancy as well. Duration of therapy remains unclear so far - she needs hematology follow up in the outpatient setting for further discussion. FULL CODE lovenox Tube feeds with tray ( dysphagia diet ) dispo : Awaiting T19-conservatorship 20 minutes spent in this encounter, >50% spent evaluating patient, reviewing labs/imaging, counseling with patient about plan of care, answering questions, and coordination of care.Electronically Signed:Ryota Treece A. Marquis Lunch, MD  Internal Medicine Hospitalist AttendingYale Regency Hospital Of [facility] Contact via : [facility] or Smart Web4/03/2022, 2:18 PM

## 2022-03-26 LAB — CBC WITH AUTO DIFFERENTIAL
BKR WAM ABSOLUTE IMMATURE GRANULOCYTES.: 0.01 x 1000/ÂµL (ref 0.00–0.30)
BKR WAM ABSOLUTE LYMPHOCYTE COUNT.: 1.83 x 1000/ÂµL (ref 0.60–3.70)
BKR WAM ABSOLUTE NRBC (2 DEC): 0 x 1000/ÂµL (ref 0.00–1.00)
BKR WAM ANALYZER ANC: 2.73 x 1000/ÂµL (ref 2.00–7.60)
BKR WAM BASOPHIL ABSOLUTE COUNT.: 0.05 x 1000/ÂµL (ref 0.00–1.00)
BKR WAM BASOPHILS: 0.9 % (ref 0.0–1.4)
BKR WAM EOSINOPHIL ABSOLUTE COUNT.: 0.45 x 1000/ÂµL (ref 0.00–1.00)
BKR WAM EOSINOPHILS: 7.7 % — ABNORMAL HIGH (ref 0.0–5.0)
BKR WAM HEMATOCRIT (2 DEC): 34.7 % — ABNORMAL LOW (ref 35.00–45.00)
BKR WAM HEMOGLOBIN: 10.6 g/dL — ABNORMAL LOW (ref 11.7–15.5)
BKR WAM IMMATURE GRANULOCYTES: 0.2 % (ref 0.0–1.0)
BKR WAM LYMPHOCYTES: 31.2 % (ref 17.0–50.0)
BKR WAM MCH (PG): 26.8 pg — ABNORMAL LOW (ref 27.0–33.0)
BKR WAM MCHC: 30.5 g/dL — ABNORMAL LOW (ref 31.0–36.0)
BKR WAM MCV: 87.6 fL (ref 80.0–100.0)
BKR WAM MONOCYTE ABSOLUTE COUNT.: 0.8 x 1000/ÂµL (ref 0.00–1.00)
BKR WAM MONOCYTES: 13.6 % — ABNORMAL HIGH (ref 4.0–12.0)
BKR WAM MPV: 10 fL (ref 8.0–12.0)
BKR WAM NEUTROPHILS: 46.4 % (ref 39.0–72.0)
BKR WAM NUCLEATED RED BLOOD CELLS: 0 % (ref 0.0–1.0)
BKR WAM PLATELETS: 347 x1000/ÂµL (ref 150–420)
BKR WAM RDW-CV: 16.4 % — ABNORMAL HIGH (ref 11.0–15.0)
BKR WAM RED BLOOD CELL COUNT.: 3.96 M/ÂµL — ABNORMAL LOW (ref 4.00–6.00)
BKR WAM WHITE BLOOD CELL COUNT: 5.9 x1000/ÂµL (ref 4.0–11.0)

## 2022-03-26 LAB — BASIC METABOLIC PANEL
BKR ANION GAP: 9 (ref 7–17)
BKR BLOOD UREA NITROGEN: 10 mg/dL (ref 8–23)
BKR BUN / CREAT RATIO: 37 — ABNORMAL HIGH (ref 8.0–23.0)
BKR CALCIUM: 10.1 mg/dL (ref 8.8–10.2)
BKR CHLORIDE: 104 mmol/L (ref 98–107)
BKR CO2: 22 mmol/L (ref 20–30)
BKR CREATININE: 0.27 mg/dL — ABNORMAL LOW (ref 0.40–1.30)
BKR EGFR, CREATININE (CKD-EPI 2021): >60 mL/min/{1.73_m2} (ref >=60)
BKR GLUCOSE: 98 mg/dL (ref 70–100)
BKR POTASSIUM: 4.1 mmol/L (ref 3.3–5.3)
BKR SODIUM: 135 mmol/L — ABNORMAL LOW (ref 136–144)

## 2022-03-26 LAB — PHOSPHORUS     (BH GH L LMW YH): BKR PHOSPHORUS: 2 mg/dL — ABNORMAL LOW (ref 2.2–4.5)

## 2022-03-26 LAB — MAGNESIUM: BKR MAGNESIUM: 1.8 mg/dL (ref 1.7–2.4)

## 2022-03-26 NOTE — Plan of Care
Adult Speech and Language PathologySwallow Progress Note4/5/2023Patient Name:  Danielle MaloneMR#:  [name] Date of Birth:  May 07, 1957Therapist:  Bryna Colander, SLP SLP IP Adult General Information - 03/26/22 1309    General Information  Subjective My toe hurts   Pertinent History of Current Problem Most recent FEES 3/27 with recommendation for a puree solid diet and thin liquids. During the exam, the patient refused to trial an advanced solid. Follow-up completed on 4/3, the patient was tolerating recommended diet and again refused to trial advanced solids. She remains on a puree solid and thin liquid diet with poor PO intake. PEG is in place.      SLP IP Adult Pain/Comfort - 03/26/22 1309    Pain/Comfort  Pain Comment (Pre/Post Treatment Pain) The patient reported pain in her toe; RN made aware.      Dysphagia Treatment - 03/26/22 1309    Dysphagia/Swallow Treatment  Dysphagia Treatment Swallowing   Dysphagia Treatment Comments The patient tolerated trials of thin liquids without overt s/sx of aspriation. Trials of a regular solid administered to determine candidacy for diet advancement. Prolonged but functional mastication appreciated. The patient requested liquid washes as needed. Complete oral clearance was achieved.   Dysphagia Follow Up Patient is tolerating current PO regimen without overt signs/symptoms of dysphagia   Dysphagia Recommendations Diet upgrade;Continue with aspiration precautions;Continue with supervision with PO intake   Medication Administration whole;with puree   Dysphagia Plan of Care Continue to follow for diet tolerance     SLP IP Adult Recommendations - 03/26/22 1430    Recommendations  SLP Therapy Frequency 1x per week   Recommendations discussed with: nurse;team;patient      SLP IP Adult Inpatient Recommendations - 03/26/22 1430    SLP Recommendations for Inpatient Admission  Swallow Recommendations 1. Advance to chopped solids, continue thin liquids 2. Medications whole in puree or via PEG 3. Aspiration precautions, routine oral care 4. 1:1 feeding assistance 5. Please re-consult if changes should occur or acute concerns arise.      SLP IP Adult Discharge Summary - 03/26/22 1430    Discharge Summary  Disposition Recommendation(s) Continued skilled Speech Therapy in discharge environment     Clelia Trabucco, MS, CCC-SLPMHB: [phone]/5/2023Problem: Speech Language Pathology GoalsGoal: Dysphagia Goals, SLPDescription: Current goal: The patient will tolerate least restrictive diet without any decline in respiratory status.Prior goal (met): Pt will tolerate pureed diet with thin liquids w/o s/x of aspiration or adverse impact on respiratory status.Outcome: Interventions implemented as appropriate

## 2022-03-26 NOTE — Plan of Care
Plan of Care Overview/ Patient Status    AO to self, knew she was in a hospital, not which one. Can be restless at times, constantly undressing, feeling her abdomen and appliances. Redirecting Pt frequently, at times this causes her to become irritated, I'm not touching anything. Calm education and explanation of risk for harm if she dislodges or pulls anything out will relax Pt. Excoriation to buttocks is improving. L leg wound has improved greatly, dressing changed.  Thickened, green stool to ostomy. Pain to LE with movement, grimacing and a moan will accompany any activity to legs. Tylenol administered. Bolus feed to PEG as ordered. Incontinence care PRN. Bed alarm is on, safety maintained, comfort provided, hourly rounding by staff.

## 2022-03-26 NOTE — Plan of Care
Plan of Care Overview/ Patient Status    0700-1900: pt alert and oriented to self only, VSS on RA.  Pt denies pain or discomfort.  PIV in right upper extremity patent, clean and intact.  Pt with peg tube, right and left illeostomy.  Peg tube site intact with tan drainage at insertion site, cleansed with soap and water, dressing changed.  Right illeostomy with watery stool and flatus noted, output throughout tour, left illeostomy with 0ml output.  Pt is a 2 person assist in bed, incontinent of urine.  Scheduled medications administered as ordered via peg tube, feeds administered, tolerated well.  Pt with poor po intake, will drink po fluids but refuses solids.  Diet changed to chopped solids.  Safety measures maintained, bed alarm activated, call light within reach, routine safety checks performed.

## 2022-03-27 NOTE — Plan of Care
Plan of Care Overview/ Patient Status    0700-1500Pt is A&Ox2. Disoriented to time and situation. VSS on R.A. R. Ileostomy is producing stool, L ileostomy is not producing output. Incontinent of bladder. Doesn't get OOB. T&R q2hrs. Tube feed via peg per MAR orders. No issues during this shift. Safety maintained with bed in lowest position, call bell within reach, and hourly rounding. Problem: Adult Inpatient Plan of CareGoal: Plan of Care ReviewOutcome: Interventions implemented as appropriateGoal: Patient-Specific Goal (Individualized)Outcome: Interventions implemented as appropriateGoal: Absence of Hospital-Acquired Illness or InjuryOutcome: Interventions implemented as appropriateGoal: Optimal Comfort and WellbeingOutcome: Interventions implemented as appropriate

## 2022-03-27 NOTE — Progress Notes
Inpatient Occupational Therapy Progress NoteDefault Flowsheet Data (most recent)   IP Adult OT Eval/Treat - 03/27/22 0801    Date of Visit / Treatment  Date of Visit / Treatment 03/27/22   Note Type Progress Note   Progress Report Due 04/10/22   End Time 0801    General Information  Pertinent History Of Current Problem Per chart: 66 y.o. 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  Was deemed to lack capaciity, however required ethics consult as family difficult to contact for medical decision making, s/p PEG placement at that time.  Course further c/b b/l DVT, started on AC.  Now presented to [facility] for placement as family unable to care for her, found to be febrile/tachycardic   Precautions/Limitations Fall Precautions    Vital Signs and Orthostatic Vital Signs  Vital Signs Vital Signs Stable    Pain/Comfort  Location #1 - PreTreatment Rating (Numbers Scale) 0/10 - no pain   Posttreatment Rating (Numbers Scale) 0/10 - no pain    Patient Coping  Observed Emotional State accepting   Verbalized Emotional State acceptance    Cognition  Orientation Level Oriented to person;Disoriented to place;Disoriented to time;Disoriented to situation   Level of Consciousness alert   Following Commands Follows one step commands with increased time   Administrator, arts / Judgment Fall risk   Short Term Memory Requires redirection   Long Term Memory Requires redirection   Attention Perseverative    Musculoskeletal  LUE Muscle Strength Grading 2-->active movement with gravity eliminated   RUE Muscle Strength Grading 2-->active movement with gravity eliminated   LLE Muscle Strength Grading 2-->active movement with gravity eliminated   RLE Muscle Strength Grading 2-->active movement with gravity eliminated    Skin Assessment  Skin Assessment See Nursing Documentation    Balance  Sitting Balance: Static  POOR+    Minimal assist to maintain static position with no Assistive Device   Sitting Balance: Dynamic  POOR     Moves through 1/4 to 1/2 ROM range with moderate assist to right self     AM-PAC - Daily Activity IP Short Form  Help needed from another person putting on/taking off regular lower body clothing 2 - A Lot   Help needed from another person for bathing (incl. washing, rinsing, drying) 2 - A Lot   Help needed from another person for toileting (incl. using toilet, bedpan, urinal) 2 - A Lot   Help needed from another person putting on/taking off regular upper body clothing 2 - A Lot   Help needed from another person taking care of personal grooming such as brushing teeth 2 - A Lot   Help needed from another person eating meals 2 - A Lot   AM-PAC Daily Activity Raw Score (Total of rows above) 12   CMS Score (based on Raw Score - with G Code) 12 - 66.57% impaired      (G Code - CL)    Bed Mobility  Rolling/Turning Right - Independence/Assistance Level Moderate assist   Rolling/Turning Left - Independence/Assistance Level Moderate assist   Supine-to-Sit Independence/Assistance Level Maximum assist   Supine-to-Sit Assist Device Hand held assist    Sit-Stand Transfer Training  Sit-to-Stand Transfer Independence/Assistance Level Maximum assist   Sit-to-Stand Transfer Assist Device Hand held assist   Stand-to-Sit Transfer Independence/Assistance Level Maximum assist   Stand-to-Sit Transfer Assist  Device Hand held assist    Endurance  Endurance Comments fair    Therapeutic Functional Activity  Therapeutic Functional Activity Detailed Documentation Developmental Handling    Clinical Impression  Follow up Assessment Pyt tolerating EOB activities and first step of mobility progression Frequency/Equipment Recommendations  OT Frequency 2x per week   What day of week is next treatment expected? Monday   4/10  OT/OTA completing this assessment GT    Planned Treatment / Interventions  General Treatment / Interventions Therapeutic Exercise    OT Discharge Summary  Disposition Recommendation Short Term Rehab

## 2022-03-27 NOTE — Care Coordination-Inpatient
Addendum:  4:15pm  4/6/23[name] called back and advised she was here visiting her sister.Went to the bedside with Danielle [name] and discussed the steps of getting conservatorship.Danielle [name] understood the steps and advised she will work on the paperwork asap. Care management department will continue to follow pt as they progress towards d/c.Danielle [name], BSHSCTransition Coordinator- [facility] Management DepartmentMHB: [phone]:  10am  4/6/23Pt's brother Danielle [name] called back.States he is not sure where they are in the process.States he will reach out to his sister Danielle [name] and will have her reach out to me.Care management department will continue to follow pt as they progress towards d/c.Danielle [name], BSHSCTransition CoordinatorHamps[facility] Management DepartmentMHB: [phone] up call placed to patients sister Danielle [name] ph#[phone] the mail box was full at the time of my call this writer was un able to leave a MSG.?Follow up call placed to patients brother Danielle [name], ph#[phone] a MSG was left requesting a call back to further discuss were they are in the process of securing a conservator. We are currently awaiting a return call.?Care management department will continue to follow pt as they progress towards d/c.Danielle [name], BSHSCTransition Coordinator- [facility] Management DepartmentMHB: [phone]

## 2022-03-27 NOTE — Progress Notes
[facility] Medicine Progress NoteAttending Provider: Tresea Mall, MD Subjective                                                                              Subjective: Interim History: Denies any new complaints as Chest Pain - SOB - Abdominal pain -No acute events overnight.Review of Allergies/Meds/Hx: Review of Allergies/Meds/Hx:I have reviewed the patient's: current scheduled medicationsScheduled Meds:Current Facility-Administered Medications Medication Dose Route Frequency Provider Last Rate Last Admin ? [Held by provider] apixaban (ELIQUIS) tablet 5 mg  5 mg Per G Tube Q12H Littie Deeds, MD     ? Jerrel Ivory Plus oral powder packet  1 packet Per G Tube BID Charlett Blake, MD   1 packet at 03/26/22 2050 ? chlorhexidine gluconate (PERIDEX) 0.12 % solution 15 mL  15 mL Mouth/Throat BID Dorisann Frames, MD   15 mL at 03/26/22 2048 ? Elemental iron 60 mg (300 mg Ferrous sulfate) / 5 mL oral liquid 45 mg  45 mg Per G Tube BID Peter Congo, Adil, MD   45 mg at 03/26/22 2048 ? enoxaparin (LOVENOX) injection 80 mg  80 mg Subcutaneous Q12H Chaar, Abdelkader, MD   80 mg at 03/26/22 2048 ? famotidine (PEPCID) suspension (8 mg/mL) 20 mg  20 mg Per G Tube Daily Peter Congo, Adil, MD   20 mg at 03/26/22 0945 ? folic acid (FOLVITE) tablet 1 mg  1 mg Per G Tube Daily Bhutta, Adil, MD   1 mg at 03/26/22 0945 ? niacin Immediate Release tablet 100 mg  100 mg Per G Tube Nightly Bhutta, Adil, MD   100 mg at 03/26/22 2048 ? nystatin (MYCOSTATIN) powder 1 Application  1 Application Topical (Top) BID Henriette Combs, MD   1 Application at 03/26/22 2049 ? pyridoxine (vitamin B6) (B-6) tablet 100 mg  100 mg Per G Tube Daily Peter Congo, Adil, MD   100 mg at 03/26/22 0945 ? [Held by provider] rosuvastatin (CRESTOR) tablet 10 mg  10 mg Per G Tube Nightly Dorisann Frames, MD   10 mg at 02/14/22 2245 ? sodium chloride 0.9 % flush 3 mL  3 mL IV Push Q8H Dorisann Frames, MD   3 mL at 03/26/22 1427 ? thiamine (VITAMIN B1) tablet 100 mg  100 mg Per G Tube BID Peter Congo, Adil, MD   100 mg at 03/26/22 2048 Continuous Infusions:PRN Meds:.acetaminophen, dextrose (GLUCOSE) oral liquid 15 g **OR** fruit juice **OR** skim milk, dextrose (GLUCOSE) oral liquid 30 g **OR** fruit juice, dextrose injection, dextrose injection, glucagon, iohexol **AND** [COMPLETED] XR Abdomen AP, ondansetron **OR** ondansetron (ZOFRAN) IV Push, sodium chloride Objective Objective: Vitals:Last 24 hours: Temp:  [97.8 ?F (36.6 ?C)-98.6 ?F (37 ?C)] 97.8 ?F (36.6 ?C)Pulse:  [87-96] 96Resp:  [18-20] 18BP: (107-135)/(68-88) 122/80SpO2:  [96 %-100 %] 100 %I/O's:Gross Totals (Last 24 hours) at 03/26/2022 2258Last data filed at 03/26/2022 2052Intake 1669.5 ml Output 625 ml Net 1044.5 ml Procedures:None Physical Exam General: alert but confused at times. No distress. HENT: Head: Normocephalic and atraumatic. Eyes: Pupils are equal, round, and reactive to light. EOM are normal. Neck: Normal range of motion. Neck supple. Cardiovascular: Normal rate and regular rhythm. Normal S1 and S2 - No murmurs/galops/rubs.Pulmonary/Chest:  Effort normal and breath sounds normal. No wheezes/rhonci/rales. Abdominal: Soft. Bowel sounds are normal. There is no abdominal tenderness. No CVA tenderness. Neurological: No focal sensory or motor deficit. Extremities: No Lower extremity edema  Labs:[facility] 04/02/230514 04/05/230643 WBC 5.9 5.9 HGB 11.4* 10.6* HCT 36.20 34.70* MCV 84.0 87.6 PLT 417 347 BMPRecent Labs Lab 04/02/230514 04/05/230643 NA 136 135* K 3.8 4.1 CL 103 104 CO2 23 22 ANIONGAP 10 9 BUN 10 10 CREATININE 0.32* 0.27* CALCIUM 10.3* 10.1 MG  --  1.8 PHOS  --  2.0* LFTsNo results for input(s): ALT, AST, ALKPHOS, BILITOT, BILIDIR, ALBUMIN in the last 168 hours.CoagsNo results for input(s): LABPROT, PTT, INR in the last 168 hours.ANEMIA STUDIESNo results for input(s): FERRITIN, IRON, TIBC, IRONSAT, VITB12 in the last 168 hours.GLUCOSERecent Labs Lab 04/02/230514 04/05/230643 GLU 98 98 A1CNo results found for: HGBA1CMICROBIOLOGYNo results for input(s): LABBLOO, LABURIN, LOWERRESPIRA in the last 168 hours.Diagnostics:XR Chest PA or AP (Portable)Result Date: 3/24/2023XR CHEST PA OR AP Date: 03/14/2022 7:56 AM INDICATION: fever, possible cough COMPARISON: XR CHEST PA OR AP 2023-Feb-21 FINDINGS: Lungs and Pleura: No focal consolidations. No pulmonary edema. No pleural effusion or pneumothorax. Heart and Mediastinum:  Cardiomediastinal contours are unchanged.  No consolidations to suggest pneumonia. Reported And Signed By: Leilani Merl, MD  [facility] Radiology and Biomedical Imaging XR Abdomen APResult Date: 4/3/2023Clinical indication: Evaluate tube location. Findings and impression: Single view of the abdomen is compared to exam performed earlier today. The pelvis is excluded. A PEG catheter was injected with contrast, and contrast fills the stomach. There is no bowel obstruction. Reported And Signed By: Gary Angola, MD  [facility] Radiology and Biomedical Imaging XR Abdomen APResult Date: 4/3/2023Study: XR ABDOMEN AP Indication: g tube positioning Comparison: None Findings and Impression: A single view abdominal radiograph was obtained. A feeding tube overlies the abdomen at the expected location of stomach, but unknown in its precise location/lumen termination. Consider repeating the exam after administering contrast through the tube to determine location. Report Initiated By:  Orlie Pollen, MD Reported And Signed By: Elray Buba, MD  [facility] Radiology and Biomedical Imaging XR Foot Bilateral AP Lateral and ObliqueResult Date: 3/7/2023AP, LATERAL AND OBLIQUE BILATERAL FOOT X-RAY Clinical Information: Baseline assessment. Assess for erosions. Comparison: None Findings: Left foot: The overall bony mineralization appears qualitatively diminished. There is evidence of joint space narrowing indicative of arthropathy involving the interphalangeal joint and metatarsal phalangeal joint of the first digit as well as the DIP and PIP joints of the remaining digits. There is mild hallux valgus as well as bunion formation. The moderate degenerative changes are noted in the midfoot. There is no acute osseous injury. Right foot: The overall bony mineralization appears qualitatively diminished. There is evidence of joint space narrowing indicative of arthropathy involving the interphalangeal joint and metatarsal phalangeal joint of the first digit as well as the DIP and PIP joints of the remaining digits. There is moderate to severe hallux valgus as well as bunion formation. The moderate degenerative changes are noted in the midfoot. There is no acute osseous injury. 1. Diffuse degenerative changes without evidence of acute osseous injury. There is no evidence of bony erosion. Reported And Signed By: Merdis Delay, MD  [facility] Radiology and Biomedical Imaging IR Gastrointestinal proceduresResult Date: 4/3/2023PROCEDURE: Replacement of gastrostomy feeding tube. DATE OF PROCEDURE: 03/21/2022 4:58 PM INDICATION:Danielle [name] is a 66 y.o. female with recent prolonged hospitalization for perforated diverticulitis, G tube placement at OSH (01/24/22), which was accidentally dislodged. A Foley catheter had been placed in  the tract to maintain its patency. PHYSICIANS: Dr. Brunetta Jeans M.D., the attending physician, was present for the procedure and its imaging.  Resident: Baldomero Lamy M.D. MEDICATIONS: None. FLUOROSCOPY TIME: 0.3 minutes. TECHNIQUE AND FINDINGS: Informed written consent was obtained. The patient was then brought to the procedure suite, placed in the supine position, and a timeout was performed. The patient's anterior abdomen and indwelling Foley catheter in the gastrostomy site were prepped and draped in standard sterile fashion. A scout image was obtained, demonstrating the tube to be in expected position. Contrast injection was then performed demonstrating the tube tip to be well-positioned within the lumen of the stomach. A 0.035 inch Amplatz wire was advanced through the catheter and coiled in the stomach. The balloon was deflated, and the catheter was removed over the Amplatz wire. A new 24 French MIC gastrostomy tube was then advanced over the wire under fluoroscopic guidance, and its tip situated within the gastric lumen. The balloon was inflated with 9 cc of sterile water, the catheter pulled back up against the stomach wall, and contrast injection performed to confirm proper positioning of the tube tip. The external fixation disc was slid down the shaft of the tubing and fixed into place. A sterile dressing was applied. The patient tolerated the procedure well without any complication and was transferred to the recovery area in stable condition. Successful gastrostomy replacement using a MIC 24 French gastrostomy tube. The tube is ready for immediate use. The tube should be flushed twice daily with 15 cc Tap Water. Report Initiated By:  Thana Farr, MD Reported And Signed By: Brunetta Jeans, MD  [facility] Radiology and Biomedical Imaging ECG/Tele Events: No ECG ordered today Assessment Assessment: Assessment:This is a 66 y.o. female PMHx of RA, recent prolonged hospitalization for perforated diverticulitis (s/p ex Lap/R hemicolectomy with end ileostomy) admitted for persistent encephalopathy/FTT and likely need for LTACH with work up revealing fever with unclear etiology (resolved). Has undergone extensive work-up of encephalopathy and leg weakness which is unremarkable. Course c/b elevated LFTs which resolved, likely 2/2 DILI from Ceftriaxone or Zosyn. Mental status appears overall stable. Plan Plan: # ACUTE PROBLEMS# Dislodged PEG tube - PEG replacement by IR  3/31- TF resumed after that which was well tolerated. - Overnight of 4/2 , patient was reported to have pulled her G-tube.  Repeated abdominal x-ray revealed G-tube in the stomach however recommended repeat KUB with contrast through the tube to determine terminal and position.  Repeated abdominal x-ray with intra tube contrast  revealed G-tube in the stomach with correct position. Tube feeds was resumed.- Poor po intake over all approx 25% - cleared by SLP for pureed diet and thin liq?# Altered mental status / LE weakness - MS improved - Unclear etiology, possibly prolonged delirium iso hospitalization from Nov 2022 - currentMRI brain, MRI total spine unremarkable- LP unremarkable- Paraneoplastic and autoimmune encephalopathy panels negative- Neurology rec'd EMG to complete work-up of LE weakness but pt declined; Neurology does not recommend any further testing and signed off 02/26/22?# Right foot cellulites with fever - transient fever resolved, no leukocytosis - CXR neg, UA neg. CRP 25.8, procal neg. Xray feet with degenerative changes but no acute findings - Suspected source was R foot cellulitis- S/P CTX 2/21 and Zosyn 2/21-2/25; S/P Empiric ABX-Oxacillin --stopped with negative infectious work-up- Consulted Podiatry--- WBAT to BLE.No?indication for podiatric surgical intervention at this time. ?- Patient may follow-up with Dr. Gazes within 3-4 weeks of discharge.?- Trend fever/CBC- Tylenol prn?# Hypercalcemia - Now resolved-  Per Endocrinology assessment :?	Hypercalcemia in the setting of PTH that is in mid reference range ( 28 -30) and elevated x 2 mid molecule PTH. ??	PTH is higher than would be expected for a non-PTH mediated mechanism of hypercalcemia. 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	Taking into consideration clinical scenario, hypercalcemia of immobilization is very likely contributing and probably the leading player in the etiology of hypercalcemia. ?	Risk of hypocalcemia exists but probably low in this patient. No additional recommendations. - S/P Zolendronic acid 4 mg X1 (3/21)- Trend Ca and Albumin as needed while IP?#  Abnormal LFTS ( resolved ) - attributed to DILI from antibiotics earlier - now resolved # Dysphagia - SLP eval 3/27 cleared for puree and thin liquids- Re-eval for diet upgrade later- resume TFs- FWF- Calorie counts- Requires 1: 1 feeding assistance- Aspiration precautions# CHRONIC PROBLEMS # RA Stable , Per Rheumatology evaluation : ?	No indication for additional immunosuppressive therapy. ?	Cont to hold MTX. ?	Use artifical tears PRN for dry eyes. ?	Mouth hygiene. If c/o dry mouth, can start on cevimeline 30 mg TID. ?	Will need to arrange follow up in [facility] Rheumatology clinic.# Recent diagnosis of DVT Per chart review, this was diagnosed incidentally on 2/1 [facility] abdomen imaging ( Incidental note of expansile, hypodense thrombus within the bilateral common femoral veins, with fat stranding about the vessels. 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 Cytology of peritoneal nodules at that time as well was negative for malignancy as well. Duration of therapy remains unclear so far - she needs hematology follow up in the outpatient setting for further discussion. FULL CODE lovenox Tube feeds with tray ( dysphagia diet ) dispo : Awaiting T19-conservatorship 20 minutes spent in this encounter, >50% spent evaluating patient, reviewing labs/imaging, counseling with patient about plan of care, answering questions, and coordination of care.Electronically Signed:Magdalynn Davilla A. Marquis Lunch, MD  Internal Medicine Hospitalist AttendingYale Voa Ambulatory Surgery Center Contact via : [facility] or Smart Web4/04/2022, 2:18 PM

## 2022-03-27 NOTE — Plan of Care
[name]					Location: EP [id number] y.o., female				Attending: Tresea Mall, MD	Admit Date: 02/11/2022			[id number] LOS: 44 days FOLLOW-UP NUTRITION ASSESSMENT Dietary Orders (From admission, onward)     Start     Ordered  03/26/22 1250  Diet Tube Feed With Tray  (Diet Tube Feed Panel)  DIET EFFECTIVE NOW      Comments: JEVITY 1.5 @ 1.5 cartons bolus TID @9am ,1pm,5pm with 1 carton at 9pm to total 5.5 cartons daily)Important:  Unrestricted diet will be sent unless otherwise specified below. Question Answer Comment Diet Selection? Dysphagia Step 3 Chopped/Soft  Safety Tray? (Suicide/Homicide/Police Custody) Safety Tray - No  Adult Tube Feed Formulas: Jevity 1.5 ([facility])  Feeding Route: G-Tube  Texture: Chopped  Initiate Nutrition Management Protocol (Yes/No?) Yes - Initiate Protocol    03/26/22 1250  03/25/22 1545  Nutrition Supplements  (Nutrition Supplement Panel)  Once      Comments: Important:  When ordering a supplement- please order only what facility provides (see suffix) Question Answer Comment Adult Supplements: Ensure Clear [name])  Supplement Frequency: TID    03/25/22 1545  03/20/22 1549  Nutrition Supplements  (Nutrition Supplement Panel)  Once      Comments: Ensure Plus High Protein TID Question:  Supplement Frequency:  Answer:  TID  03/20/22 1548    No Known AllergiesANTHROPOMETRICS:Height:???70Admit wt:?no recent wt to assess.?Current wt:?73.2 kg (03/27/22) appears decreased from last known wt of 79.4 kg (02/14/22- SNF report)?this may be scale related differences as well as possible wt loss.Admit wt trends:?03/27/22 0739 73.2 kg Bed 03/26/22 0803 71.3 kg Bed 03/25/22 0826 71.5 kg Bed 03/22/22 0804 71 kg Bed 03/20/22 0812 72.6 kg Bed 03/19/22 0824 72.4 kg Bed Dosing Wt:?73?kg (based on?current wt trends and normal BMI)BMI:?25??(based on?current wt)?Additional wt information:Ht confirmed with pt: Yes-per EMR. Pt last known wt was 98.6 kg (12/13/2018).?Wt loss?confirmed with admit wt?as pt no longer weighs?98 kg.?Time line for wt loss in not clear and pt cannot report.??Of note, pt went from +29L  To +2L since admision. 1+ trace edema per flowsheets.?Wt:03/20/22    72.6 kg            Bed03/29/23    72.4 kg            Bed12/23/19    98.6 kg?ESTIMATED NUTRITION REQUIREMENTS:Kcal/day:?1825??????????????(25 ?kcal/kg)Protein/day:?87 ?????????????(1.2gm/kg)Fluids/day:?1825 mL/d?(30mL/kcal) ONLY as medical and fluid status allows and only per team discretion. Needs based on:?Dosing Wt (73?kg), maintenance.??NUTRITION ASSESSMENT: PT EMR reviewed for follow up assessment. Pt only alert to self. Meds and labs reviewed. Pt found to have alterations to PEG over this past week, now provided with goal volumes to support nutrition needs as estimated above. Pt followed by SLP and cleared for chopped solids and thin liquids. Pt intake reviewed and is taking only ~25% of one item off trays. Bulk of needs to continue through tube feed regimen. ?Labs-  Na 135; Cr 0.27, low phos- may need supplementPt is ordered for ferrous sulfate BID, Folvite daily, Niacin nightly, pyridoxidine dialy, Thiamine BID. Would consider need moving forward while on goal rate feeds. ?Cultural/Religious/Ethnic Needs: Unknown ?Food Allergies: NKFA??NUTRITION DIAGNOSIS:Severe malnutrition r/t depletion aeb moderate muscle and fat depletion.- continues??INTERVENTIONS/RECOMMENDATIONS: Meals and Snacks:- Continue chopped diet with thin liquids per SLP recommendationsGoal: Patient to meet  >?50-75% of estimated nutrition needs prior to next nutrition assessment- goal will be made po for comfort given support provided by gaol volume bolus feeds. ?Nutrition Supplement Therapy:- will discontinue Ensure clear apple and Ensure Plus HP TID to diet orderGoal:  Patient to consume an average of 2-3?oral nutrition supplements/day  prior to next nutrition assessment - not taken and will not require while on tube feeds. ?Enteral Nutrition:?	Formula-?JEVITY 1.5?@ 1 carton bolus TID (@9am ,1pm,5pm) with 1.5 cartons at 9pm to total 5.5 cartons daily)-	Provides:?1.3L,?1955kcal,?82gr?Protein,?988?mL free water/d-	Additional Free water @?960?mL/d as medical/ fluid status allows at Teams discretion.??	Goal: ?-	Patient will receive >90% of goal volume TF/ TPN prior to next nutrition assessment- met will continue?- currently at goal volume however held due to PEG issues this past week- will continue goal. ??Discharge Planning and Transfer of Nutrition Care:  Nutrition related discharge needs still being determined at this time, will continue to follow  ?MONITORING / EVALUATION:?Food/Nutrition-Related History:Food and Nutrient Intake?Anthropometric Measurements:Weight ?Biochemical Data, Medical Tests and Procedures:Electrolyte and renal profileGlucose/endocrine profile?Nutrition-Focus Physical FindingsOverall findings?Electronically signed by Sherrilee Gilles, RD, April 6, 2023MHB- [phone] Please note: Nutrition is a consult only service on weekends and holidays.  Please enter a consult in EPIC if assistance is needed on a weekend or holiday or via MHB [phone] to contact the covering RD.

## 2022-03-27 NOTE — Plan of Care
Inpatient Physical Therapy Progress Note IP Adult PT Eval/Treat - 03/27/22 0931    Date of Visit / Treatment  Date of Visit / Treatment 03/27/22   Note Type Progress Note   Progress Report Due 03/31/22   End Time 0931    General Information  Subjective So many juices.   General Observations Pt received asleep in bed; on RA; NAD; ostomy; purwick; B podus boots; RN cleared   Precautions/Limitations Fall Precautions;Bed alarm;Chair alarm   Precautions/Limitations Comment WBAT BLE; dysphagia diet    Weight Bearing Status  Weight Bearing Status Comments WBAT LLE    Vital Signs and Orthostatic Vital Signs  Vital Signs Vital Signs Stable   Vital Signs Free text RA; NAD    Pain/Comfort  Pain Comment (Pre/Post Treatment Pain) Verbal and facial grimace w/ touch and mobility of BLE, unquantified; RN aware and repositioned for comfort    Patient Coping  Observed Emotional State accepting;cooperative   Verbalized Emotional State acceptance    Cognition  Overall Cognitive Status Impaired   Orientation Level Oriented to person   Level of Consciousness alert   Following Commands Follows one step commands with increased time;Follows one step commands with repetition   Personal Safety / Judgment Fall risk;Requires supervision with mobility   Attention Requires redirection   Cognition Comments Distracted easily    Range of Motion  Range of Motion Examination WFL except   Range of Motion Comments Tight B hamstrings and heel cords    Manual Muscle Testing  Manual Muscle Testing Comments + Deconditioning    Skin Assessment  Skin Assessment See Nursing Documentation    Balance  Sitting Balance: Static  POOR      Moderate assist to maintain static position with no Assistive Device   Sitting Balance: Dynamic  POOR-    Unable to move from midline voluntarily, maximal assist to right self   Standing Balance: Static POOR-     Maximal assist to maintain static position with no Assistive Device   Standing Balance: Dynamic  POOR-    Unable to move from midline voluntarily, maximal assist to right self   Balance Assist Device Hand held assist   Balance Skills Training Comment Ax2    Bed Mobility  Supine-to-Sit Independence/Assistance Level Maximum assist;Assist of 2   Supine-to-Sit Assist Device Hand held assist;Head of bed elevated;Draw pad   Sit-to-Supine Independence/Assistance Level Maximum assist;Assist of 2   Sit-to-Supine Assist Device Hand held assist   Bed Mobility Comments Retropulsive and R lateral lean EOB; VCs for anterior/upright lean    Sit-Stand Transfer Training  Sit-to-Stand Transfer Independence/Assistance Level Maximum assist;Assist of 2   Sit-to-Stand Transfer Assist Device Hand held assist   Stand-to-Sit Transfer Independence/Assistance Level Maximum assist;Assist of 2   Stand-to-Sit Transfer Assist Device Hand held assist   Sit-Stand Transfer Comments STS x 2 w/ maxAx2; VCs for upright, unable to achieve; B knee block   Stand-Sit Transfer Comments Poor eccentric control    Gait Training  Gait Training Comments NT d/t pain, weakness, and unsteadiness    Handoff Documentation  Handoff Patient in bed;Bed alarm;Patient instructed to call nursing for mobility;Discussed with nursing    Endurance  Endurance Comments Fair-    PT- AM-PAC - Basic Mobility Screen- How much help from another person do you currently need.....  Turning from your back to your side while in a a flat bed without using rails? 1 - Total - Requires total assistance or cannot do it at all.   Moving from  lying on your back to sitting on the side of a flat bed without using bed rails? 1 - Total - Requires total assistance or cannot do it at all.   Moving to and from a bed to a chair (including a wheelchair)? 1 - Total - Requires total assistance or cannot do it at all.   Standing up from a chair using your arms(e.g., wheelchair or bedside chair)? 1 - Total - Requires total assistance or cannot do it at all.   To walk in a hospital room? 1 - Total - Requires total assistance or cannot do it at all.   Climbing 3-5 steps with a railing? 1 - Total - Requires total assistance or cannot do it at all.   AMPAC Mobility Score 6   TARGET Highest Level of Mobility Mobility Level 2, Turn self in bed/bed activity/dependent transfer     Therapeutic Exercise  Therapeutic Exercise Comments AROM x 4    Neuromuscular Re-education  Neuromuscular Re-education Comments Static and dynamic seated postural control training    Therapeutic Functional Activity  Therapeutic Functional Activity Comments Bed mobility; transfer training; Pt edu    Clinical Impression  Follow up Assessment Pt seen for PT f/u. Continues to require maxAx2 for bed mobility and transfers. Displays R lateral lean and retropulsive seated EOB, requiring verbal and tactile cues to prevent LOB. Worked on static and dynamic postural control seated EOB w/ cross body arm reaches. Will continue to benefit from skilled services to improve functional ROM/strength, dynamic balance, and activity tolerance. Current PT d/c rec is STR.   Criteria for Skilled Therapeutic Interventions Met yes    Patient/Family Stated Goals  Patient/Family Stated Goal(s) feel better    Frequency/Equipment Recommendations  PT Frequency 2x per week   What day of week is next treatment expected? Monday   4/10  PT/PTA completing this assessment RL   Equipment Needs During Admission/Treatment No device    PT Recommendations for Inpatient Admission  Activity/Level of Assist mechanical lift;assist of 2    Planned Treatment / Interventions  Plan for Next Visit Progress as tol   Training Treatment / Interventions Balance / Investment banker, operational;Endurance Training;Functional Personnel officer / Interventions Patient Education / Training    PT Discharge Summary  Physical Therapy Disposition Recommendation Short Term Rehab     Shanon Payor PT, [place]: [phone]

## 2022-03-28 NOTE — Plan of Care
Plan of Care Overview/ Patient Status    0700-1500: A&Ox2, disoriented to time and situation, VSS on RA, no complaint of pain. R ileostomy draining liquid green stool, appliance CDI. L ileostomy with no output, appliance CDI. PEG tube to LUQ, patent with no residuals, bolus feeds administered per order, tolerated well. DTI to R foot, dressing CDI. MAD to groin, barrier cream applied. Ulcer to L leg, dressing CDI. Ax2 q2 T&R, specialty bed in place.  Pills crushed through PEG. Bed alarm activated, hourly rounding complete and safety maintained.Problem: Adult Inpatient Plan of CareGoal: Plan of Care ReviewOutcome: Interventions implemented as appropriateGoal: Patient-Specific Goal (Individualized)Outcome: Interventions implemented as appropriateGoal: Absence of Hospital-Acquired Illness or InjuryOutcome: Interventions implemented as appropriateGoal: Optimal Comfort and WellbeingOutcome: Interventions implemented as appropriateGoal: Readiness for Transition of CareOutcome: Interventions implemented as appropriate Problem: InfectionGoal: Absence of Infection Signs and SymptomsOutcome: Interventions implemented as appropriate Problem: Physical Therapy GoalsGoal: Physical Therapy GoalsDescription: PT GOALS - addressed 3/13/231. Patient will demonstrate active range of motion x 4 extremities in preparation for mobility2. Caregivers will be independent and safely implement a range of motion/positioning program to prevent loss of range of motion and skin integrity3. Patient will perform bed mobility with maximal assist4. Patient will tolerate sitting edge of bed >5 minutes with minimal assist5. Patient will perform transfers with moderate assist of 2 using rolling walker  Outcome: Interventions implemented as appropriate Problem: Occupational Therapy GoalsGoal: Occupational Therapy GoalsDescription: OT goals1. Pt will move to EOB for seated ADL mod I2. Pt will follow 3/4 simple one step motor commands IND3. Pt will sequence through set up ADL task INDOutcome: Interventions implemented as appropriate Problem: Speech Language Pathology GoalsGoal: Dysphagia Goals, SLPDescription: Current goal: The patient will tolerate least restrictive diet without any decline in respiratory status.Prior goal (met): Pt will tolerate pureed diet with thin liquids w/o s/x of aspiration or adverse impact on respiratory status.Outcome: Interventions implemented as appropriate Problem: Impaired Wound HealingGoal: Optimal Wound HealingOutcome: Interventions implemented as appropriate Problem: Fall Injury RiskGoal: Absence of Fall and Fall-Related InjuryOutcome: Interventions implemented as appropriate Problem: Violence Risk or ActualGoal: Anger and Impulse ControlOutcome: Interventions implemented as appropriate Problem: Skin Injury Risk IncreasedGoal: Skin Health and IntegrityOutcome: Interventions implemented as appropriate

## 2022-03-28 NOTE — Plan of Care
Plan of Care Overview/ Patient Status    1900-0700 Alert and oriented to self. VSS on RA. Denies pain. Bolus feed given as ordered via PEG. Pt also on dysphagia soft/chopped diet. Right ileostomy intact. Left mucus fistula ostomy pouch intact. Incontinence care provided as needed. Turned and repositioned q2h. Heel off-loading boots on. Safety maintained. Hourly rounding completed. See flowsheets for assessments.Problem: Adult Inpatient Plan of CareGoal: Plan of Care ReviewOutcome: Interventions implemented as appropriateFlowsheets (Taken 03/28/2022 0009)Progress: no changePlan of Care Reviewed With: patient

## 2022-03-28 NOTE — Plan of Care
Plan of Care Overview/ Patient Status    1900-0700Alert and oriented to self. VSS on RA. Denies pain. Bolus feed given as ordered via PEG. Pt also on dysphagia soft/chopped diet. Right ileostomy intact. Left mucus fistula ostomy pouch intact. Incontinence care provided as needed. Turned and repositioned q2h. Heel off-loading boots on. Safety maintained. Hourly rounding completed. See flowsheets for assessments.Problem: Adult Inpatient Plan of CareGoal: Plan of Care ReviewOutcome: Interventions implemented as appropriateFlowsheets (Taken 03/27/2022 0108)Progress: no changePlan of Care Reviewed With: patient

## 2022-03-28 NOTE — Progress Notes
[facility] Medicine Progress NoteAttending Provider: Tresea Mall, MD Subjective                                                                              Subjective: Interim History: Denies any new complaints as Chest Pain - SOB - Abdominal pain -No acute events overnight.Review of Allergies/Meds/Hx: Review of Allergies/Meds/Hx:I have reviewed the patient's: current scheduled medicationsScheduled Meds:Current Facility-Administered Medications Medication Dose Route Frequency Provider Last Rate Last Admin ? [Held by provider] apixaban (ELIQUIS) tablet 5 mg  5 mg Per G Tube Q12H Littie Deeds, MD     ? Jerrel Ivory Plus oral powder packet  1 packet Per G Tube BID Charlett Blake, MD   1 packet at 03/27/22 2003 ? chlorhexidine gluconate (PERIDEX) 0.12 % solution 15 mL  15 mL Mouth/Throat BID Dorisann Frames, MD   15 mL at 03/27/22 2004 ? Elemental iron 60 mg (300 mg Ferrous sulfate) / 5 mL oral liquid 45 mg  45 mg Per G Tube BID Peter Congo, Adil, MD   45 mg at 03/27/22 2003 ? enoxaparin (LOVENOX) injection 80 mg  80 mg Subcutaneous Q12H Chaar, Abdelkader, MD   80 mg at 03/27/22 2004 ? famotidine (PEPCID) suspension (8 mg/mL) 20 mg  20 mg Per G Tube Daily Peter Congo, Adil, MD   20 mg at 03/27/22 0933 ? folic acid (FOLVITE) tablet 1 mg  1 mg Per G Tube Daily Bhutta, Adil, MD   1 mg at 03/27/22 1610 ? niacin Immediate Release tablet 100 mg  100 mg Per G Tube Nightly Bhutta, Adil, MD   100 mg at 03/27/22 2004 ? nystatin (MYCOSTATIN) powder 1 Application  1 Application Topical (Top) BID Henriette Combs, MD   1 Application at 03/27/22 2004 ? pyridoxine (vitamin B6) (B-6) tablet 100 mg  100 mg Per G Tube Daily Peter Congo, Adil, MD   100 mg at 03/27/22 0933 ? [Held by provider] rosuvastatin (CRESTOR) tablet 10 mg  10 mg Per G Tube Nightly Dorisann Frames, MD   10 mg at 02/14/22 2245 ? sodium chloride 0.9 % flush 3 mL  3 mL IV Push Q8H Dorisann Frames, MD   3 mL at 03/26/22 1427 ? thiamine (VITAMIN B1) tablet 100 mg  100 mg Per G Tube BID Peter Congo, Adil, MD   100 mg at 03/27/22 2004 Continuous Infusions:PRN Meds:.acetaminophen, dextrose (GLUCOSE) oral liquid 15 g **OR** fruit juice **OR** skim milk, dextrose (GLUCOSE) oral liquid 30 g **OR** fruit juice, dextrose injection, dextrose injection, glucagon, iohexol **AND** [COMPLETED] XR Abdomen AP, ondansetron **OR** ondansetron (ZOFRAN) IV Push, sodium chloride Objective Objective: Vitals:Last 24 hours: Temp:  [97.7 ?F (36.5 ?C)-98.7 ?F (37.1 ?C)] 98.7 ?F (37.1 ?C)Pulse:  [74-99] 89Resp:  [18-20] 18BP: (104-124)/(66-84) 105/70SpO2:  [95 %-98 %] 96 %I/O's:Gross Totals (Last 24 hours) at 03/27/2022 2258Last data filed at 03/27/2022 2116Intake 1562 ml Output 575 ml Net 987 ml Procedures:None  General: alert but confused at times. No distress. HENT: Head: Normocephalic and atraumatic. Eyes: Pupils are equal, round, and reactive to light. EOM are normal. Neck: Normal range of motion. Neck supple. Cardiovascular: Normal rate and regular rhythm. Normal S1 and S2 - No murmurs/galops/rubs.Pulmonary/Chest: Effort  normal and breath sounds normal. No wheezes/rhonci/rales. Abdominal: Soft. Bowel sounds are normal. There is no abdominal tenderness. No CVA tenderness. Neurological: No focal sensory or motor deficit. Extremities: No Lower extremity edema  Labs:[facility] 04/02/230514 04/05/230643 WBC 5.9 5.9 HGB 11.4* 10.6* HCT 36.20 34.70* MCV 84.0 87.6 PLT 417 347 BMPRecent Labs Lab 04/02/230514 04/05/230643 NA 136 135* K 3.8 4.1 CL 103 104 CO2 23 22 ANIONGAP 10 9 BUN 10 10 CREATININE 0.32* 0.27* CALCIUM 10.3* 10.1 MG  --  1.8 PHOS  --  2.0* LFTsNo results for input(s): ALT, AST, ALKPHOS, BILITOT, BILIDIR, ALBUMIN in the last 168 hours.CoagsNo results for input(s): LABPROT, PTT, INR in the last 168 hours.ANEMIA STUDIESNo results for input(s): FERRITIN, IRON, TIBC, IRONSAT, VITB12 in the last 168 hours.GLUCOSERecent Labs Lab 04/02/230514 04/05/230643 GLU 98 98 A1CNo results found for: HGBA1CMICROBIOLOGYNo results for input(s): LABBLOO, LABURIN, LOWERRESPIRA in the last 168 hours.Diagnostics:XR Chest PA or AP (Portable)Result Date: 3/24/2023XR CHEST PA OR AP Date: 03/14/2022 7:56 AM INDICATION: fever, possible cough COMPARISON: XR CHEST PA OR AP 2023-Feb-21 FINDINGS: Lungs and Pleura: No focal consolidations. No pulmonary edema. No pleural effusion or pneumothorax. Heart and Mediastinum:  Cardiomediastinal contours are unchanged.  No consolidations to suggest pneumonia. Reported And Signed By: Leilani Merl, MD  [facility] Radiology and Biomedical Imaging XR Abdomen APResult Date: 4/3/2023Clinical indication: Evaluate tube location. Findings and impression: Single view of the abdomen is compared to exam performed earlier today. The pelvis is excluded. A PEG catheter was injected with contrast, and contrast fills the stomach. There is no bowel obstruction. Reported And Signed By: Gary Angola, MD  [facility] Radiology and Biomedical Imaging XR Abdomen APResult Date: 4/3/2023Study: XR ABDOMEN AP Indication: g tube positioning Comparison: None Findings and Impression: A single view abdominal radiograph was obtained. A feeding tube overlies the abdomen at the expected location of stomach, but unknown in its precise location/lumen termination. Consider repeating the exam after administering contrast through the tube to determine location. Report Initiated By:  Orlie Pollen, MD Reported And Signed By: Elray Buba, MD  [facility] Radiology and Biomedical Imaging IR Gastrointestinal proceduresResult Date: 4/3/2023PROCEDURE: Replacement of gastrostomy feeding tube. DATE OF PROCEDURE: 03/21/2022 4:58 PM INDICATION:Danielle [name] is a 66 y.o. female with recent prolonged hospitalization for perforated diverticulitis, G tube placement at OSH (01/24/22), which was accidentally dislodged. A Foley catheter had been placed in the tract to maintain its patency. PHYSICIANS: Dr. Brunetta Jeans M.D., the attending physician, was present for the procedure and its imaging.  Resident: Baldomero Lamy M.D. MEDICATIONS: None. FLUOROSCOPY TIME: 0.3 minutes. TECHNIQUE AND FINDINGS: Informed written consent was obtained. The patient was then brought to the procedure suite, placed in the supine position, and a timeout was performed. The patient's anterior abdomen and indwelling Foley catheter in the gastrostomy site were prepped and draped in standard sterile fashion. A scout image was obtained, demonstrating the tube to be in expected position. Contrast injection was then performed demonstrating the tube tip to be well-positioned within the lumen of the stomach. A 0.035 inch Amplatz wire was advanced through the catheter and coiled in the stomach. The balloon was deflated, and the catheter was removed over the Amplatz wire. A new 24 French MIC gastrostomy tube was then advanced over the wire under fluoroscopic guidance, and its tip situated within the gastric lumen. The balloon was inflated with 9 cc of sterile water, the catheter pulled back up against the stomach wall, and contrast injection performed to confirm proper positioning of the tube  tip. The external fixation disc was slid down the shaft of the tubing and fixed into place. A sterile dressing was applied. The patient tolerated the procedure well without any complication and was transferred to the recovery area in stable condition. Successful gastrostomy replacement using a MIC 24 French gastrostomy tube. The tube is ready for immediate use. The tube should be flushed twice daily with 15 cc Tap Water. Report Initiated By:  Thana Farr, MD Reported And Signed By: Brunetta Jeans, MD  [facility] Radiology and Biomedical Imaging ECG/Tele Events: No ECG ordered today Assessment Assessment: Assessment:This is a 66 y.o. female PMHx of RA, recent prolonged hospitalization for perforated diverticulitis (s/p ex Lap/R hemicolectomy with end ileostomy) admitted for persistent encephalopathy/FTT and likely need for LTACH with work up revealing fever with unclear etiology (resolved). Has undergone extensive work-up of encephalopathy and leg weakness which is unremarkable. Course c/b elevated LFTs which resolved, likely 2/2 DILI from Ceftriaxone or Zosyn. Mental status appears overall stable. Plan Plan: # ACUTE PROBLEMS# Dislodged PEG tube - PEG replacement by IR  3/31- TF resumed after that which was well tolerated. - Overnight of 4/2 , patient was reported to have pulled her G-tube.  Repeated abdominal x-ray revealed G-tube in the stomach however recommended repeat KUB with contrast through the tube to determine terminal and position.  Repeated abdominal x-ray with intra tube contrast  revealed G-tube in the stomach with correct position. Tube feeds was resumed.- Poor po intake over all approx 25% - cleared by SLP for pureed diet and thin liq?# Altered mental status / LE weakness - MS improved - Unclear etiology, possibly prolonged delirium iso hospitalization from Nov 2022 - currentMRI brain, MRI total spine unremarkable- LP unremarkable- Paraneoplastic and autoimmune encephalopathy panels negative- Neurology rec'd EMG to complete work-up of LE weakness but pt declined; Neurology does not recommend any further testing and signed off 02/26/22?# Right foot cellulites with fever - transient fever resolved, no leukocytosis - CXR neg, UA neg. CRP 25.8, procal neg. Xray feet with degenerative changes but no acute findings - Suspected source was R foot cellulitis- S/P CTX 2/21 and Zosyn 2/21-2/25; S/P Empiric ABX-Oxacillin --stopped with negative infectious work-up- Consulted Podiatry--- WBAT to BLE.No?indication for podiatric surgical intervention at this time. ?- Patient may follow-up with Dr. Gazes within 3-4 weeks of discharge.?- Trend fever/CBC- Tylenol prn?# Hypercalcemia - Now resolved- Per Endocrinology assessment :?	Hypercalcemia in the setting of PTH that is in mid reference range ( 28 -30) and elevated x 2 mid molecule PTH. ??	PTH is higher than would be expected for a non-PTH mediated mechanism of hypercalcemia. 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	Taking into consideration clinical scenario, hypercalcemia of immobilization is very likely contributing and probably the leading player in the etiology of hypercalcemia. ?	Risk of hypocalcemia exists but probably low in this patient. No additional recommendations. - S/P Zolendronic acid 4 mg X1 (3/21)- Trend Ca and Albumin as needed while IP?#  Abnormal LFTS ( resolved ) - attributed to DILI from antibiotics earlier - now resolved # Dysphagia - SLP eval 3/27 cleared for puree and thin liquids- Re-eval for diet upgrade later- resume TFs- FWF- Calorie counts- Requires 1: 1 feeding assistance- Aspiration precautions# CHRONIC PROBLEMS # RA Stable , Per Rheumatology evaluation : ?	No indication for additional immunosuppressive therapy. ?	Cont to hold MTX. ?	Use artifical tears PRN for dry eyes. ?	Mouth hygiene. If c/o dry mouth, can start on cevimeline 30 mg TID. ?	Will  need to arrange follow up in [facility] Rheumatology clinic.# Recent diagnosis of DVT Per chart review, this was diagnosed incidentally on 2/1 [facility] abdomen imaging ( Incidental note of expansile, hypodense thrombus within the bilateral common femoral veins, with fat stranding about the vessels. 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 Cytology of peritoneal nodules at that time as well was negative for malignancy as well. Duration of therapy remains unclear so far - she needs hematology follow up in the outpatient setting for further discussion. FULL CODE lovenox Tube feeds with tray ( dysphagia diet ) dispo : Awaiting T19-conservatorship 20 minutes spent in this encounter, >50% spent evaluating patient, reviewing labs/imaging, counseling with patient about plan of care, answering questions, and coordination of care.Electronically Signed:Emmaleah [name] A. Marquis Lunch, MD  Internal Medicine Hospitalist AttendingYale The Center For Ambulatory Surgery Contact via : [facility] or Smart Web4/05/2022, 2:18 PM

## 2022-03-28 NOTE — Plan of Care
Plan of Care Overview/ Patient Status    0700-1900Pt is A&Ox2. Disoriented to time and situation. Takes meds through PEG. Tube bolus per MAR. No pain unless moving BLE and its momentary. Doesn't get OOB. Ileostomy in place and is producing. Pt was able to use bedpan. T&R q2hrs. Safety maintained w/bed in lowest position, call bell within reach and hourly rounding.   Problem: Adult Inpatient Plan of CareGoal: Plan of Care ReviewOutcome: Interventions implemented as appropriateGoal: Patient-Specific Goal (Individualized)Outcome: Interventions implemented as appropriateGoal: Absence of Hospital-Acquired Illness or InjuryOutcome: Interventions implemented as appropriateGoal: Optimal Comfort and WellbeingOutcome: Interventions implemented as appropriate

## 2022-03-28 NOTE — Progress Notes
[facility] Progress NoteAttending Provider: Tresea Mall, MD Subjective                                                                              Subjective: Interim History: Denies any new complaints as Chest Pain - SOB - Abdominal pain -No acute events overnight.Review of Allergies/Meds/Hx: Review of Allergies/Meds/Hx:I have reviewed the patient's: current scheduled medicationsScheduled Meds:Current Facility-Administered Medications Medication Dose Route Frequency Provider Last Rate Last Admin ? [Held by provider] apixaban (ELIQUIS) tablet 5 mg  5 mg Per G Tube Q12H Littie Deeds, MD     ? Jerrel Ivory Plus oral powder packet  1 packet Per G Tube BID Charlett Blake, MD   1 packet at 03/28/22 [phone] ? chlorhexidine gluconate (PERIDEX) 0.12 % solution 15 mL  15 mL Mouth/Throat BID Dorisann Frames, MD   15 mL at 03/28/22 9604 ? Elemental iron 60 mg (300 mg Ferrous sulfate) / 5 mL oral liquid 45 mg  45 mg Per G Tube BID Peter Congo, Adil, MD   45 mg at 03/28/22 0837 ? enoxaparin (LOVENOX) injection 80 mg  80 mg Subcutaneous Q12H Chaar, Abdelkader, MD   80 mg at 03/28/22 0837 ? famotidine (PEPCID) suspension (8 mg/mL) 20 mg  20 mg Per G Tube Daily Peter Congo, Adil, MD   20 mg at 03/28/22 0837 ? folic acid (FOLVITE) tablet 1 mg  1 mg Per G Tube Daily Bhutta, Adil, MD   1 mg at 03/28/22 5409 ? niacin Immediate Release tablet 100 mg  100 mg Per G Tube Nightly Bhutta, Adil, MD   100 mg at 03/27/22 2004 ? nystatin (MYCOSTATIN) powder 1 Application  1 Application Topical (Top) BID Henriette Combs, MD   1 Application at 03/28/22 0901 ? pyridoxine (vitamin B6) (B-6) tablet 100 mg  100 mg Per G Tube Daily Peter Congo, Adil, MD   100 mg at 03/28/22 0837 ? [Held by provider] rosuvastatin (CRESTOR) tablet 10 mg  10 mg Per G Tube Nightly Dorisann Frames, MD   10 mg at 02/14/22 2245 ? sodium chloride 0.9 % flush 3 mL  3 mL IV Push Q8H Dorisann Frames, MD   3 mL at 03/26/22 1427 ? thiamine (VITAMIN B1) tablet 100 mg  100 mg Per G Tube BID Peter Congo, Adil, MD   100 mg at 03/28/22 0837 Continuous Infusions:PRN Meds:.acetaminophen, dextrose (GLUCOSE) oral liquid 15 g **OR** fruit juice **OR** skim milk, dextrose (GLUCOSE) oral liquid 30 g **OR** fruit juice, dextrose injection, dextrose injection, glucagon, iohexol **AND** [COMPLETED] XR Abdomen AP, ondansetron **OR** ondansetron (ZOFRAN) IV Push, sodium chloride Objective Objective: Vitals:Last 24 hours: Temp:  [98 ?F (36.7 ?C)-99.4 ?F (37.4 ?C)] 98.2 ?F (36.8 ?C)Pulse:  [80-97] 82Resp:  [18-20] 20BP: (105-125)/(70-81) 120/80SpO2:  [95 %-100 %] 99 %I/O's:Gross Totals (Last 24 hours) at 03/28/2022 1906Last data filed at 03/28/2022 1800Intake 1662 ml Output 425 ml Net 1237 ml Procedures:None  General: alert but confused at times. No distress. HENT: Head: Normocephalic and atraumatic. Eyes: Pupils are equal, round, and reactive to light. EOM are normal. Neck: Normal range of motion. Neck supple. Cardiovascular: Normal rate and regular rhythm. Normal S1 and S2 - No murmurs/galops/rubs.Pulmonary/Chest: Effort  normal and breath sounds normal. No wheezes/rhonci/rales. Abdominal: Soft. Bowel sounds are normal. There is no abdominal tenderness. No CVA tenderness. Neurological: No focal sensory or motor deficit. Extremities: No Lower extremity edema  Labs:[facility] 04/02/230514 04/05/230643 WBC 5.9 5.9 HGB 11.4* 10.6* HCT 36.20 34.70* MCV 84.0 87.6 PLT 417 347 BMPRecent Labs Lab 04/02/230514 04/05/230643 NA 136 135* K 3.8 4.1 CL 103 104 CO2 23 22 ANIONGAP 10 9 BUN 10 10 CREATININE 0.32* 0.27* CALCIUM 10.3* 10.1 MG  --  1.8 PHOS  --  2.0* LFTsNo results for input(s): ALT, AST, ALKPHOS, BILITOT, BILIDIR, ALBUMIN in the last 168 hours.CoagsNo results for input(s): LABPROT, PTT, INR in the last 168 hours.ANEMIA STUDIESNo results for input(s): FERRITIN, IRON, TIBC, IRONSAT, VITB12 in the last 168 hours.GLUCOSER[facility] Lab 04/02/230514 04/05/230643 GLU 98 98 A1CNo results found for: HGBA1CMICROBIOLOGYNo results for input(s): LABBLOO, LABURIN, LOWERRESPIRA in the last 168 hours.Diagnostics:XR Chest PA or AP (Portable)Result Date: 3/24/2023XR CHEST PA OR AP Date: 03/14/2022 7:56 AM INDICATION: fever, possible cough COMPARISON: XR CHEST PA OR AP 2023-Feb-21 FINDINGS: Lungs and Pleura: No focal consolidations. No pulmonary edema. No pleural effusion or pneumothorax. Heart and Mediastinum:  Cardiomediastinal contours are unchanged.  No consolidations to suggest pneumonia. Reported And Signed By: Leilani Merl, MD  [facility] Radiology and Biomedical Imaging XR Abdomen APResult Date: 4/3/2023Clinical indication: Evaluate tube location. Findings and impression: Single view of the abdomen is compared to exam performed earlier today. The pelvis is excluded. A PEG catheter was injected with contrast, and contrast fills the stomach. There is no bowel obstruction. Reported And Signed By: Gary Angola, MD  [facility] Radiology and Biomedical Imaging XR Abdomen APResult Date: 4/3/2023Study: XR ABDOMEN AP Indication: g tube positioning Comparison: None Findings and Impression: A single view abdominal radiograph was obtained. A feeding tube overlies the abdomen at the expected location of stomach, but unknown in its precise location/lumen termination. Consider repeating the exam after administering contrast through the tube to determine location. Report Initiated By:  Orlie Pollen, MD Reported And Signed By: Elray Buba, MD  [facility] Radiology and Biomedical Imaging IR Gastrointestinal proceduresResult Date: 4/3/2023PROCEDURE: Replacement of gastrostomy feeding tube. DATE OF PROCEDURE: 03/21/2022 4:58 PM INDICATION:Erza Cowens is a 66 y.o. female with recent prolonged hospitalization for perforated diverticulitis, G tube placement at OSH (01/24/22), which was accidentally dislodged. A Foley catheter had been placed in the tract to maintain its patency. PHYSICIANS: Dr. Brunetta Jeans M.D., the attending physician, was present for the procedure and its imaging.  Resident: Baldomero Lamy M.D. MEDICATIONS: None. FLUOROSCOPY TIME: 0.3 minutes. TECHNIQUE AND FINDINGS: Informed written consent was obtained. The patient was then brought to the procedure suite, placed in the supine position, and a timeout was performed. The patient's anterior abdomen and indwelling Foley catheter in the gastrostomy site were prepped and draped in standard sterile fashion. A scout image was obtained, demonstrating the tube to be in expected position. Contrast injection was then performed demonstrating the tube tip to be well-positioned within the lumen of the stomach. A 0.035 inch Amplatz wire was advanced through the catheter and coiled in the stomach. The balloon was deflated, and the catheter was removed over the Amplatz wire. A new 24 French MIC gastrostomy tube was then advanced over the wire under fluoroscopic guidance, and its tip situated within the gastric lumen. The balloon was inflated with 9 cc of sterile water, the catheter pulled back up against the stomach wall, and contrast injection performed to confirm proper positioning of the tube  tip. The external fixation disc was slid down the shaft of the tubing and fixed into place. A sterile dressing was applied. The patient tolerated the procedure well without any complication and was transferred to the recovery area in stable condition. Successful gastrostomy replacement using a MIC 24 French gastrostomy tube. The tube is ready for immediate use. The tube should be flushed twice daily with 15 cc Tap Water. Report Initiated By:  Thana Farr, MD Reported And Signed By: Brunetta Jeans, MD  [facility] Radiology and Biomedical Imaging ECG/Tele Events: No ECG ordered today Assessment Assessment: Assessment:This is a 66 y.o. female PMHx of RA, recent prolonged hospitalization for perforated diverticulitis (s/p ex Lap/R hemicolectomy with end ileostomy) admitted for persistent encephalopathy/FTT and likely need for LTACH with work up revealing fever with unclear etiology (resolved). Has undergone extensive work-up of encephalopathy and leg weakness which is unremarkable. Course c/b elevated LFTs which resolved, likely 2/2 DILI from Ceftriaxone or Zosyn. Mental status appears overall stable. Plan Plan: # ACUTE PROBLEMS# Dislodged PEG tube - PEG replacement by IR  3/31- TF resumed after that which was well tolerated. - Overnight of 4/2 , patient was reported to have pulled her G-tube.  Repeated abdominal x-ray revealed G-tube in the stomach however recommended repeat KUB with contrast through the tube to determine terminal and position.  Repeated abdominal x-ray with intra tube contrast  revealed G-tube in the stomach with correct position. Tube feeds was resumed.- Poor po intake over all approx 25% - cleared by SLP for pureed diet and thin liq?# Altered mental status / LE weakness - MS improved - Unclear etiology, possibly prolonged delirium iso hospitalization from Nov 2022 - currentMRI brain, MRI total spine unremarkable- LP unremarkable- Paraneoplastic and autoimmune encephalopathy panels negative- Neurology rec'd EMG to complete work-up of LE weakness but pt declined; Neurology does not recommend any further testing and signed off 02/26/22?# Right foot cellulites with fever - transient fever resolved, no leukocytosis - CXR neg, UA neg. CRP 25.8, procal neg. Xray feet with degenerative changes but no acute findings - Suspected source was R foot cellulitis- S/P CTX 2/21 and Zosyn 2/21-2/25; S/P Empiric ABX-Oxacillin --stopped with negative infectious work-up- Consulted Podiatry--- WBAT to BLE.No?indication for podiatric surgical intervention at this time. ?- Patient may follow-up with Dr. Gazes within 3-4 weeks of discharge.?- Trend fever/CBC- Tylenol prn?# Hypercalcemia - Now resolved- Per Endocrinology assessment :?	Hypercalcemia in the setting of PTH that is in mid reference range ( 28 -30) and elevated x 2 mid molecule PTH. ??	PTH is higher than would be expected for a non-PTH mediated mechanism of hypercalcemia. 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	Taking into consideration clinical scenario, hypercalcemia of immobilization is very likely contributing and probably the leading player in the etiology of hypercalcemia. ?	Risk of hypocalcemia exists but probably low in this patient. No additional recommendations. - S/P Zolendronic acid 4 mg X1 (3/21)- Trend Ca and Albumin as needed while IP?#  Abnormal LFTS ( resolved ) - attributed to DILI from antibiotics earlier - now resolved # Dysphagia - SLP eval 3/27 cleared for puree and thin liquids- Re-eval for diet upgrade later- resume TFs- FWF- Calorie counts- Requires 1: 1 feeding assistance- Aspiration precautions# CHRONIC PROBLEMS # RA Stable , Per Rheumatology evaluation : ?	No indication for additional immunosuppressive therapy. ?	Cont to hold MTX. ?	Use artifical tears PRN for dry eyes. ?	Mouth hygiene. If c/o dry mouth, can start on cevimeline 30 mg TID. ?	Will  need to arrange follow up in [facility] Rheumatology clinic.# Recent diagnosis of DVT Per chart review, this was diagnosed incidentally on 2/1 [place] abdomen imaging ( Incidental note of expansile, hypodense thrombus within the bilateral common femoral veins, with fat stranding about the vessels. 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 Cytology of peritoneal nodules at that time as well was negative for malignancy as well. Duration of therapy remains unclear so far - she needs hematology follow up in the outpatient setting for further discussion. FULL CODE lovenox Tube feeds with tray ( dysphagia diet ) dispo : Awaiting T19-conservatorship 20 minutes spent in this encounter, >50% spent evaluating patient, reviewing labs/imaging, counseling with patient about plan of care, answering questions, and coordination of care.Electronically Signed:Edwina Grossberg A. Marquis Lunch, MD  Internal Medicine Hospitalist AttendingYale [facility] Contact via : [facility] or Smart Web4/06/2022, 2:18 PM

## 2022-03-29 LAB — CBC WITH AUTO DIFFERENTIAL
BKR WAM ABSOLUTE IMMATURE GRANULOCYTES.: 0.02 x 1000/ÂµL (ref 0.00–0.30)
BKR WAM ABSOLUTE LYMPHOCYTE COUNT.: 1.64 x 1000/ÂµL (ref 0.60–3.70)
BKR WAM ABSOLUTE NRBC (2 DEC): 0 x 1000/ÂµL (ref 0.00–1.00)
BKR WAM ANALYZER ANC: 3.18 x 1000/ÂµL (ref 2.00–7.60)
BKR WAM BASOPHIL ABSOLUTE COUNT.: 0.04 x 1000/ÂµL (ref 0.00–1.00)
BKR WAM BASOPHILS: 0.7 % (ref 0.0–1.4)
BKR WAM EOSINOPHIL ABSOLUTE COUNT.: 0.31 x 1000/ÂµL (ref 0.00–1.00)
BKR WAM EOSINOPHILS: 5.2 % — ABNORMAL HIGH (ref 0.0–5.0)
BKR WAM HEMATOCRIT (2 DEC): 34.8 % — ABNORMAL LOW (ref 35.00–45.00)
BKR WAM HEMOGLOBIN: 10.6 g/dL — ABNORMAL LOW (ref 11.7–15.5)
BKR WAM IMMATURE GRANULOCYTES: 0.3 % (ref 0.0–1.0)
BKR WAM LYMPHOCYTES: 27.7 % (ref 17.0–50.0)
BKR WAM MCH (PG): 26.8 pg — ABNORMAL LOW (ref 27.0–33.0)
BKR WAM MCHC: 30.5 g/dL — ABNORMAL LOW (ref 31.0–36.0)
BKR WAM MCV: 87.9 fL (ref 80.0–100.0)
BKR WAM MONOCYTE ABSOLUTE COUNT.: 0.72 x 1000/ÂµL (ref 0.00–1.00)
BKR WAM MONOCYTES: 12.2 % — ABNORMAL HIGH (ref 4.0–12.0)
BKR WAM MPV: 10.9 fL (ref 8.0–12.0)
BKR WAM NEUTROPHILS: 53.9 % (ref 39.0–72.0)
BKR WAM NUCLEATED RED BLOOD CELLS: 0 % (ref 0.0–1.0)
BKR WAM PLATELETS: 331 x1000/ÂµL (ref 150–420)
BKR WAM RDW-CV: 16.5 % — ABNORMAL HIGH (ref 11.0–15.0)
BKR WAM RED BLOOD CELL COUNT.: 3.96 M/ÂµL — ABNORMAL LOW (ref 4.00–6.00)
BKR WAM WHITE BLOOD CELL COUNT: 5.9 x1000/ÂµL (ref 4.0–11.0)

## 2022-03-29 LAB — BASIC METABOLIC PANEL
BKR ANION GAP: 12 (ref 7–17)
BKR BLOOD UREA NITROGEN: 10 mg/dL (ref 8–23)
BKR BUN / CREAT RATIO: 37 — ABNORMAL HIGH (ref 8.0–23.0)
BKR CALCIUM: 10.3 mg/dL — ABNORMAL HIGH (ref 8.8–10.2)
BKR CHLORIDE: 104 mmol/L (ref 98–107)
BKR CO2: 22 mmol/L (ref 20–30)
BKR CREATININE: 0.27 mg/dL — ABNORMAL LOW (ref 0.40–1.30)
BKR EGFR, CREATININE (CKD-EPI 2021): >60 mL/min/{1.73_m2} (ref >=60)
BKR GLUCOSE: 95 mg/dL (ref 70–100)
BKR POTASSIUM: 4.3 mmol/L (ref 3.3–5.3)
BKR SODIUM: 138 mmol/L (ref 136–144)

## 2022-03-29 LAB — PHOSPHORUS     (BH GH L LMW YH): BKR PHOSPHORUS: 3 mg/dL (ref 2.2–4.5)

## 2022-03-29 LAB — MAGNESIUM: BKR MAGNESIUM: 1.9 mg/dL (ref 1.7–2.4)

## 2022-03-29 NOTE — Progress Notes
[facility] Medicine Progress NoteAttending Provider: Tresea Mall, MD Subjective                                                                              Subjective: Interim History: Denies any new complaints as Chest Pain - SOB - Abdominal pain -No acute events overnight.Review of Allergies/Meds/Hx: Review of Allergies/Meds/Hx:I have reviewed the patient's: current scheduled medicationsScheduled Meds:Current Facility-Administered Medications Medication Dose Route Frequency Provider Last Rate Last Admin ? Banatrol Plus oral powder packet  1 packet Per G Tube BID Charlett Blake, MD   1 packet at 03/29/22 0919 ? chlorhexidine gluconate (PERIDEX) 0.12 % solution 15 mL  15 mL Mouth/Throat BID Dorisann Frames, MD   15 mL at 03/29/22 0919 ? Elemental iron 60 mg (300 mg Ferrous sulfate) / 5 mL oral liquid 45 mg  45 mg Per G Tube BID Peter Congo, Adil, MD   45 mg at 03/29/22 0919 ? enoxaparin (LOVENOX) injection 80 mg  80 mg Subcutaneous Q12H Chaar, Abdelkader, MD   80 mg at 03/29/22 0920 ? famotidine (PEPCID) suspension (8 mg/mL) 20 mg  20 mg Per G Tube Daily Bhutta, Adil, MD   20 mg at 03/29/22 0919 ? folic acid (FOLVITE) tablet 1 mg  1 mg Per G Tube Daily Bhutta, Adil, MD   1 mg at 03/29/22 0919 ? niacin Immediate Release tablet 100 mg  100 mg Per G Tube Nightly Bhutta, Adil, MD   100 mg at 03/28/22 2148 ? nystatin (MYCOSTATIN) powder 1 Application  1 Application Topical (Top) BID Henriette Combs, MD   1 Application at 03/29/22 [phone] ? pyridoxine (vitamin B6) (B-6) tablet 100 mg  100 mg Per G Tube Daily Peter Congo, Adil, MD   100 mg at 03/29/22 0919 ? [Held by provider] rosuvastatin (CRESTOR) tablet 10 mg  10 mg Per G Tube Nightly Dorisann Frames, MD   10 mg at 02/14/22 2245 ? sodium chloride 0.9 % flush 3 mL  3 mL IV Push Q8H Dorisann Frames, MD   3 mL at 03/29/22 1518 ? thiamine (VITAMIN B1) tablet 100 mg  100 mg Per G Tube BID Peter Congo, Adil, MD   100 mg at 03/29/22 0919 Continuous Infusions:PRN Meds:.acetaminophen, dextrose (GLUCOSE) oral liquid 15 g **OR** fruit juice **OR** skim milk, dextrose (GLUCOSE) oral liquid 30 g **OR** fruit juice, dextrose injection, dextrose injection, glucagon, iohexol **AND** [COMPLETED] XR Abdomen AP, ondansetron **OR** ondansetron (ZOFRAN) IV Push, sodium chloride Objective Objective: Vitals:Last 24 hours: Temp:  [97.2 ?F (36.2 ?C)-98.6 ?F (37 ?C)] 97.5 ?F (36.4 ?C)Pulse:  [91-95] 95Resp:  [20] 20BP: (109-134)/(74-88) 124/76SpO2:  [98 %-100 %] 100 %I/O's:Gross Totals (Last 24 hours) at 03/29/2022 1635Last data filed at 03/29/2022 1200Intake 540 ml Output 200 ml Net 340 ml Procedures:None  General: alert but confused at times. No distress. HENT: Head: Normocephalic and atraumatic. Eyes: Pupils are equal, round, and reactive to light. EOM are normal. Neck: Normal range of motion. Neck supple. Cardiovascular: Normal rate and regular rhythm. Normal S1 and S2 - No murmurs/galops/rubs.Pulmonary/Chest: Effort normal and breath sounds normal. No wheezes/rhonci/rales. Abdominal: Soft. Bowel sounds are normal. There is no abdominal tenderness. No CVA tenderness. Neurological: No focal  sensory or motor deficit. Extremities: No Lower extremity edema  Labs:[facility] 04/02/230514 04/05/230643 04/08/230525 WBC 5.9 5.9 5.9 HGB 11.4* 10.6* 10.6* HCT 36.20 34.70* 34.80* MCV 84.0 87.6 87.9 PLT 417 347 331 BMPRecent Labs Lab 04/02/230514 04/05/230643 04/08/230525 NA 136 135* 138 K 3.8 4.1 4.3 CL 103 104 104 CO2 23 22 22  ANIONGAP 10 9 12  BUN 10 10 10  CREATININE 0.32* 0.27* 0.27* CALCIUM 10.3* 10.1 10.3* MG  --  1.8 1.9 PHOS  --  2.0* 3.0 LFTsNo results for input(s): ALT, AST, ALKPHOS, BILITOT, BILIDIR, ALBUMIN in the last 168 hours.CoagsNo results for input(s): LABPROT, PTT, INR in the last 168 hours.ANEMIA STUDIESNo results for input(s): FERRITIN, IRON, TIBC, IRONSAT, VITB12 in the last 168 hours.GLUCOSERecent Labs Lab 04/02/230514 04/05/230643 04/08/230525 GLU 98 98 95 A1CNo results found for: HGBA1CMICROBIOLOGYNo results for input(s): LABBLOO, LABURIN, LOWERRESPIRA in the last 168 hours.Diagnostics:XR Chest PA or AP (Portable)Result Date: 3/24/2023XR CHEST PA OR AP Date: 03/14/2022 7:56 AM INDICATION: fever, possible cough COMPARISON: XR CHEST PA OR AP 2023-Feb-21 FINDINGS: Lungs and Pleura: No focal consolidations. No pulmonary edema. No pleural effusion or pneumothorax. Heart and Mediastinum:  Cardiomediastinal contours are unchanged.  No consolidations to suggest pneumonia. Reported And Signed By: Leilani Merl, MD  [facility] Radiology and Biomedical Imaging XR Abdomen APResult Date: 4/3/2023Clinical indication: Evaluate tube location. Findings and impression: Single view of the abdomen is compared to exam performed earlier today. The pelvis is excluded. A PEG catheter was injected with contrast, and contrast fills the stomach. There is no bowel obstruction. Reported And Signed By: Gary Angola, MD  [facility] Radiology and Biomedical Imaging XR Abdomen APResult Date: 4/3/2023Study: XR ABDOMEN AP Indication: g tube positioning Comparison: None Findings and Impression: A single view abdominal radiograph was obtained. A feeding tube overlies the abdomen at the expected location of stomach, but unknown in its precise location/lumen termination. Consider repeating the exam after administering contrast through the tube to determine location. Report Initiated By:  Orlie Pollen, MD Reported And Signed By: Elray Buba, MD  [facility] Radiology and Biomedical Imaging IR Gastrointestinal proceduresResult Date: 4/3/2023PROCEDURE: Replacement of gastrostomy feeding tube. DATE OF PROCEDURE: 03/21/2022 4:58 PM INDICATION:Danielle [name] is a 66 y.o. female with recent prolonged hospitalization for perforated diverticulitis, G tube placement at OSH (01/24/22), which was accidentally dislodged. A Foley catheter had been placed in the tract to maintain its patency. PHYSICIANS: Dr. Brunetta Jeans M.D., the attending physician, was present for the procedure and its imaging.  Resident: Baldomero Lamy M.D. MEDICATIONS: None. FLUOROSCOPY TIME: 0.3 minutes. TECHNIQUE AND FINDINGS: Informed written consent was obtained. The patient was then brought to the procedure suite, placed in the supine position, and a timeout was performed. The patient's anterior abdomen and indwelling Foley catheter in the gastrostomy site were prepped and draped in standard sterile fashion. A scout image was obtained, demonstrating the tube to be in expected position. Contrast injection was then performed demonstrating the tube tip to be well-positioned within the lumen of the stomach. A 0.035 inch Amplatz wire was advanced through the catheter and coiled in the stomach. The balloon was deflated, and the catheter was removed over the Amplatz wire. A new 24 French MIC gastrostomy tube was then advanced over the wire under fluoroscopic guidance, and its tip situated within the gastric lumen. The balloon was inflated with 9 cc of sterile water, the catheter pulled back up against the stomach wall, and contrast injection performed to confirm proper positioning of the tube tip. The external fixation disc  was slid down the shaft of the tubing and fixed into place. A sterile dressing was applied. The patient tolerated the procedure well without any complication and was transferred to the recovery area in stable condition. Successful gastrostomy replacement using a MIC 24 French gastrostomy tube. The tube is ready for immediate use. The tube should be flushed twice daily with 15 cc Tap Water. Report Initiated By:  Thana Farr, MD Reported And Signed By: Brunetta Jeans, MD  [facility] Radiology and Biomedical Imaging ECG/Tele Events: No ECG ordered today Assessment Assessment: Assessment:This is a 66 y.o. female PMHx of RA, recent prolonged hospitalization for perforated diverticulitis (s/p ex Lap/R hemicolectomy with end ileostomy) admitted for persistent encephalopathy/FTT and likely need for LTACH with work up revealing fever with unclear etiology (resolved). Has undergone extensive work-up of encephalopathy and leg weakness which is unremarkable. Course c/b elevated LFTs which resolved, likely 2/2 DILI from Ceftriaxone or Zosyn. Mental status appears overall stable. Plan Plan: # ACUTE PROBLEMS# Dislodged PEG tube - PEG replacement by IR  3/31- TF resumed after that which was well tolerated. - Overnight of 4/2 , patient was reported to have pulled her G-tube.  Repeated abdominal x-ray revealed G-tube in the stomach however recommended repeat KUB with contrast through the tube to determine terminal and position.  Repeated abdominal x-ray with intra tube contrast  revealed G-tube in the stomach with correct position. Tube feeds was resumed and the patient is tolerating well. - Poor po intake over all approx 25% - cleared by SLP for pureed diet and thin liq?# Altered mental status / LE weakness - MS improved - Unclear etiology, possibly prolonged delirium iso hospitalization from Nov 2022 - currentMRI brain, MRI total spine unremarkable- LP unremarkable- Paraneoplastic and autoimmune encephalopathy panels negative- Neurology rec'd EMG to complete work-up of LE weakness but pt declined; Neurology does not recommend any further testing and signed off 02/26/22?# Right foot cellulites with fever - transient fever resolved, no leukocytosis - CXR neg, UA neg. CRP 25.8, procal neg. Xray feet with degenerative changes but no acute findings - Suspected source was R foot cellulitis- S/P CTX 2/21 and Zosyn 2/21-2/25; S/P Empiric ABX-Oxacillin --stopped with negative infectious work-up- Consulted Podiatry--- WBAT to BLE.No?indication for podiatric surgical intervention at this time. ?- Patient may follow-up with Dr. Gazes within 3-4 weeks of discharge.?- Trend fever/CBC- Tylenol prn?# Hypercalcemia - Now resolved- Per Endocrinology assessment :?	Hypercalcemia in the setting of PTH that is in mid reference range ( 28 -30) and elevated x 2 mid molecule PTH. ??	PTH is higher than would be expected for a non-PTH mediated mechanism of hypercalcemia. 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	Taking into consideration clinical scenario, hypercalcemia of immobilization is very likely contributing and probably the leading player in the etiology of hypercalcemia. ?	Risk of hypocalcemia exists but probably low in this patient. No additional recommendations. - S/P Zolendronic acid 4 mg X1 (3/21)- Trend Ca and Albumin as needed while IP?#  Abnormal LFTS ( resolved ) - attributed to DILI from antibiotics earlier - now resolved # Dysphagia - SLP eval 3/27 cleared for puree and thin liquids- Re-eval for diet upgrade later- resume TFs- FWF- Calorie counts- Requires 1: 1 feeding assistance- Aspiration precautions# CHRONIC PROBLEMS # RA Stable , Per Rheumatology evaluation : ?	No indication for additional immunosuppressive therapy. ?	Cont to hold MTX. ?	Use artifical tears PRN for dry eyes. ?	Mouth hygiene. If c/o dry mouth, can start on cevimeline 30 mg  TID. ?	Will need to arrange follow up in [facility] Rheumatology clinic.# Recent diagnosis of DVT Per chart review, this was diagnosed incidentally on 2/1 [facility] abdomen imaging ( Incidental note of expansile, hypodense thrombus within the bilateral common femoral veins, with fat stranding about the vessels. 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 Cytology of peritoneal nodules at that time as well was negative for malignancy as well. Duration of therapy remains unclear so far - she needs hematology follow up in the outpatient setting for further discussion. FULL CODE lovenox Tube feeds with tray ( dysphagia diet ) dispo : Awaiting T19-conservatorship 20 minutes spent in this encounter, >50% spent evaluating patient, reviewing labs/imaging, counseling with patient about plan of care, answering questions, and coordination of care.Electronically Signed:Reghan Thul A. Marquis Lunch, MD  Internal Medicine Hospitalist AttendingYale Surgery Center Of [facility] LLC Contact via : [facility] or Smart Web4/07/2022, 2:18 PM

## 2022-03-29 NOTE — Plan of Care
Plan of Care Overview/ Patient Status    1400-1930:  Pt A&O to?1-2. Confused.?Pt able to respond to questions.?VSS on RA. No signs of pain or respiratory distress. Scheduled meds given per order. Ileostomy x2  in place R Ileostomy +flatus and stool. ?Pt incontinent of urine, prn care provided.?Dressing changed to R ileostomy.??PEG tube?in place, dressing changed this shift.?Bolus TF given 1.5 cartons of Jevity 1.5.??Good PO intake. Dressings clean dry and intact. Blood glucose monitoring. Lip care?provided.??Podus boots in place. T&Rq2h. Hourly rounding. Pt resting in bed. Safety precautions maintained, bed alarm on, call bell in reach, will ctm and follow nursing pocProblem: Adult Inpatient Plan of CareGoal: Plan of Care Review4/07/2022 1648 by Su Grand, RNOutcome: Interventions implemented as appropriateFlowsheets (Taken 03/29/2022 1648)Progress: improvingPlan of Care Reviewed With: patient4/07/2022 0641 by Su Grand, RNOutcome: Interventions implemented as appropriateFlowsheets (Taken 03/29/2022 0309)Progress: improvingPlan of Care Reviewed With: patient

## 2022-03-29 NOTE — Plan of Care
Plan of Care Overview/ Patient Status    [name]&O to self, vss, no complaint of pain. PO intake poor. TF administered as ordered. Tolerating well. Incontinent of urine , care provided. Illeostomy output 100 ml this shift.  Podus boots to BL feet. At times requests to take them off. Heels off loaded on pillows. Oral care provided as ordered. Wound care performed. T&P q 2hrs as tolerated. On a specialty bed. Safety maintained, bed alarm on, call bell in reach.

## 2022-03-30 MED ORDER — TRAMADOL (ULTRAM) 25 MG HALFTAB
25 mg | Freq: Once | GASTROSTOMY | Status: CP
Start: 2022-03-30 — End: ?
  Administered 2022-03-31: 04:00:00 25 mg via GASTROSTOMY

## 2022-03-30 MED ORDER — GABAPENTIN 100 MG CAPSULE
100 mg | Freq: Every evening | ORAL | Status: DC
Start: 2022-03-30 — End: 2022-04-01
  Administered 2022-03-31 – 2022-04-01 (×2): 100 mg via ORAL

## 2022-03-30 NOTE — Plan of Care
Plan of Care Overview/ Patient Status    Assumed care of pt @0700 -1200. Alert & confused. Illogical speech. No s/o pain or discomfort. Room air. No c/o shortness of breath. Vital stable. Bolus feed of Jevity provided. Tolerated well. Ostomy bags intact. Peg dressing c/d/I. Medications crushed via peg. Resting in bed. Safety rounds maintained. Problem: Adult Inpatient Plan of CareGoal: Plan of Care ReviewFlowsheets (Taken 03/30/2022 1152)Progress: improvingPlan of Care Reviewed With: patient

## 2022-03-30 NOTE — Plan of Care
Plan of Care Overview/ Patient Status    Pt Danielle [name] is a 66 y.o. female. Pt with no pain at this time.Neuro: Alert and orientated to selfCV: WDLResp: room airGI: ostomy LBM 03/30/22 GU/Endo: incontinence of urineSkin:  See skin flowsheet. T+P q2h; skin check performed. Pt remains on reduction mattress and heels elevated off bed in boots.Handoff report given to RN Selena Batten Vital SignsTemp: 98.9 ?F (37.2 ?C)Temp src: OralPulse: 85Heart Rate Source: MonitorHeart Rhythm: RegularResp: 18BP: 108/70NIBP MAP (Monitor/Manual Entry): 83NIBP MAP (mmHg) (calculated/READ ONLY): 83BP Location: Left armBP Method: AutomaticPatient Position: LyingOxygen TherapySpO2: 100 %Device (Oxygen Therapy): room airBasamah Cayenne [name], RN4/9/20236:50 PMSee flowsheets and MAR for additional information.

## 2022-03-30 NOTE — Plan of Care
Plan of Care Overview/ Patient Status    A&Ox1-2, VSS. Ostomy remains intact. Patient intermittently restless, pulling at gown, redirected as needed. Tolerating diet and tube feed as ordered. No c/o pain, nausea, or abdominal discomfort. No significant overnight events. Resting with bed in lowest position, call light within reach, and fall bundle in place.

## 2022-03-30 NOTE — Progress Notes
[facility] Medicine Progress NoteAttending Provider: Tresea Mall, MD Subjective                                                                              Subjective: Interim History: Denies any new complaints as Chest Pain - SOB - Abdominal pain -Forgetful at times requiring multiple re-orientation., No acute events overnight.Review of Allergies/Meds/Hx: Review of Allergies/Meds/Hx:I have reviewed the patient's: current scheduled medicationsScheduled Meds:Current Facility-Administered Medications Medication Dose Route Frequency Provider Last Rate Last Admin ? Banatrol Plus oral powder packet  1 packet Per G Tube BID Charlett Blake, MD   1 packet at 03/30/22 0847 ? chlorhexidine gluconate (PERIDEX) 0.12 % solution 15 mL  15 mL Mouth/Throat BID Dorisann Frames, MD   15 mL at 03/30/22 1610 ? Elemental iron 60 mg (300 mg Ferrous sulfate) / 5 mL oral liquid 45 mg  45 mg Per G Tube BID Peter Congo, Adil, MD   45 mg at 03/30/22 9604 ? enoxaparin (LOVENOX) injection 80 mg  80 mg Subcutaneous Q12H Chaar, Abdelkader, MD   80 mg at 03/30/22 5409 ? famotidine (PEPCID) suspension (8 mg/mL) 20 mg  20 mg Per G Tube Daily Peter Congo, Adil, MD   20 mg at 03/30/22 0838 ? folic acid (FOLVITE) tablet 1 mg  1 mg Per G Tube Daily Bhutta, Adil, MD   1 mg at 03/30/22 8119 ? gabapentin (NEURONTIN) capsule 100 mg  100 mg Oral Nightly Tresea Mall, MD     ? niacin Immediate Release tablet 100 mg  100 mg Per G Tube Nightly Bhutta, Adil, MD   100 mg at 03/29/22 2110 ? nystatin (MYCOSTATIN) powder 1 Application  1 Application Topical (Top) BID Henriette Combs, MD   1 Application at 03/30/22 [phone] ? pyridoxine (vitamin B6) (B-6) tablet 100 mg  100 mg Per G Tube Daily Peter Congo, Adil, MD   100 mg at 03/30/22 2956 ? [Held by provider] rosuvastatin (CRESTOR) tablet 10 mg  10 mg Per G Tube Nightly Dorisann Frames, MD   10 mg at 02/14/22 2245 ? sodium chloride 0.9 % flush 3 mL  3 mL IV Push Q8H Dorisann Frames, MD   3 mL at 03/29/22 2112 ? thiamine (VITAMIN B1) tablet 100 mg  100 mg Per G Tube BID Peter Congo, Adil, MD   100 mg at 03/30/22 0832 Continuous Infusions:PRN Meds:.acetaminophen, dextrose (GLUCOSE) oral liquid 15 g **OR** fruit juice **OR** skim milk, dextrose (GLUCOSE) oral liquid 30 g **OR** fruit juice, dextrose injection, dextrose injection, glucagon, iohexol **AND** [COMPLETED] XR Abdomen AP, ondansetron **OR** ondansetron (ZOFRAN) IV Push, sodium chloride Objective Objective: Vitals:Last 24 hours: Temp:  [97.4 ?F (36.3 ?C)-99.3 ?F (37.4 ?C)] 97.4 ?F (36.3 ?C)Pulse:  [87-110] 93Resp:  [14-20] 16BP: (102-121)/(57-77) 109/68SpO2:  [97 %-98 %] 98 %I/O's:Gross Totals (Last 24 hours) at 03/30/2022 1508Last data filed at 03/30/2022 0600Intake 810.5 ml Output 375 ml Net 435.5 ml Procedures:None  General: alert but confused at times. No distress. HENT: Head: Normocephalic and atraumatic. Eyes: Pupils are equal, round, and reactive to light. EOM are normal. Neck: Normal range of motion. Neck supple. Cardiovascular: Normal rate and regular rhythm. Normal S1 and S2 - No murmurs/galops/rubs.Pulmonary/Chest: Effort  normal and breath sounds normal. No wheezes/rhonci/rales. Abdominal: Soft. Bowel sounds are normal. There is no abdominal tenderness. No CVA tenderness. Neurological: No focal sensory or motor deficit. Extremities: No Lower extremity edema  Labs:[facility] 04/05/230643 04/08/230525 WBC 5.9 5.9 HGB 10.6* 10.6* HCT 34.70* 34.80* MCV 87.6 87.9 PLT 347 331 BMPRecent Labs Lab 04/05/230643 04/08/230525 NA 135* 138 K 4.1 4.3 CL 104 104 CO2 22 22 ANIONGAP 9 12 BUN 10 10 CREATININE 0.27* 0.27* CALCIUM 10.1 10.3* MG 1.8 1.9 PHOS 2.0* 3.0 LFTsNo results for input(s): ALT, AST, ALKPHOS, BILITOT, BILIDIR, ALBUMIN in the last 168 hours.CoagsNo results for input(s): LABPROT, PTT, INR in the last 168 hours.ANEMIA STUDIESNo results for input(s): FERRITIN, IRON, TIBC, IRONSAT, VITB12 in the last 168 hours.GLUCOSERec[facility] Lab 04/05/230643 04/08/230525 GLU 98 95 A1CNo results found for: HGBA1CMICROBIOLOGYNo results for input(s): LABBLOO, LABURIN, LOWERRESPIRA in the last 168 hours.Diagnostics:XR Chest PA or AP (Portable)Result Date: 3/24/2023XR CHEST PA OR AP Date: 03/14/2022 7:56 AM INDICATION: fever, possible cough COMPARISON: XR CHEST PA OR AP 2023-Feb-21 FINDINGS: Lungs and Pleura: No focal consolidations. No pulmonary edema. No pleural effusion or pneumothorax. Heart and Mediastinum:  Cardiomediastinal contours are unchanged.  No consolidations to suggest pneumonia. Reported And Signed By: Leilani Merl, MD  [facility] Radiology and Biomedical Imaging XR Abdomen APResult Date: 4/3/2023Clinical indication: Evaluate tube location. Findings and impression: Single view of the abdomen is compared to exam performed earlier today. The pelvis is excluded. A PEG catheter was injected with contrast, and contrast fills the stomach. There is no bowel obstruction. Reported And Signed By: Gary Angola, MD  [facility] Radiology and Biomedical Imaging XR Abdomen APResult Date: 4/3/2023Study: XR ABDOMEN AP Indication: g tube positioning Comparison: None Findings and Impression: A single view abdominal radiograph was obtained. A feeding tube overlies the abdomen at the expected location of stomach, but unknown in its precise location/lumen termination. Consider repeating the exam after administering contrast through the tube to determine location. Report Initiated By:  Orlie Pollen, MD Reported And Signed By: Elray Buba, MD  [facility] Radiology and Biomedical Imaging IR Gastrointestinal proceduresResult Date: 4/3/2023PROCEDURE: Replacement of gastrostomy feeding tube. DATE OF PROCEDURE: 03/21/2022 4:58 PM INDICATION:Isadore Sterbenz is a 66 y.o. female with recent prolonged hospitalization for perforated diverticulitis, G tube placement at OSH (01/24/22), which was accidentally dislodged. A Foley catheter had been placed in the tract to maintain its patency. PHYSICIANS: Dr. Brunetta Jeans M.D., the attending physician, was present for the procedure and its imaging.  Resident: Baldomero Lamy M.D. MEDICATIONS: None. FLUOROSCOPY TIME: 0.3 minutes. TECHNIQUE AND FINDINGS: Informed written consent was obtained. The patient was then brought to the procedure suite, placed in the supine position, and a timeout was performed. The patient's anterior abdomen and indwelling Foley catheter in the gastrostomy site were prepped and draped in standard sterile fashion. A scout image was obtained, demonstrating the tube to be in expected position. Contrast injection was then performed demonstrating the tube tip to be well-positioned within the lumen of the stomach. A 0.035 inch Amplatz wire was advanced through the catheter and coiled in the stomach. The balloon was deflated, and the catheter was removed over the Amplatz wire. A new 24 French MIC gastrostomy tube was then advanced over the wire under fluoroscopic guidance, and its tip situated within the gastric lumen. The balloon was inflated with 9 cc of sterile water, the catheter pulled back up against the stomach wall, and contrast injection performed to confirm proper positioning of the tube tip. The external fixation  disc was slid down the shaft of the tubing and fixed into place. A sterile dressing was applied. The patient tolerated the procedure well without any complication and was transferred to the recovery area in stable condition. Successful gastrostomy replacement using a MIC 24 French gastrostomy tube. The tube is ready for immediate use. The tube should be flushed twice daily with 15 cc Tap Water. Report Initiated By:  Thana Farr, MD Reported And Signed By: Brunetta Jeans, MD  [facility] Radiology and Biomedical Imaging ECG/Tele Events: No ECG ordered today Assessment Assessment: Assessment:This is a 66 y.o. female PMHx of RA, recent prolonged hospitalization for perforated diverticulitis (s/p ex Lap/R hemicolectomy with end ileostomy) admitted for persistent encephalopathy/FTT and likely need for LTACH with work up revealing fever with unclear etiology (resolved). Has undergone extensive work-up of encephalopathy and leg weakness which is unremarkable. Course c/b elevated LFTs which resolved, likely 2/2 DILI from Ceftriaxone or Zosyn. Mental status appears overall stable. Plan Plan: # ACUTE PROBLEMS# Dislodged PEG tube - PEG replacement by IR  3/31- TF resumed after that which was well tolerated. - Overnight of 4/2 , patient was reported to have pulled her G-tube.  Repeated abdominal x-ray revealed G-tube in the stomach however recommended repeat KUB with contrast through the tube to determine terminal and position.  Repeated abdominal x-ray with intra tube contrast  revealed G-tube in the stomach with correct position. Tube feeds was resumed and the patient is tolerating well. - Poor po intake over all approx 25% - cleared by SLP for pureed diet and thin liq?# Altered mental status / LE weakness - MS improved - Unclear etiology, possibly prolonged delirium iso hospitalization from Nov 2022 - currentMRI brain, MRI total spine unremarkable- LP unremarkable- Paraneoplastic and autoimmune encephalopathy panels negative- Neurology rec'd EMG to complete work-up of LE weakness but pt declined; Neurology does not recommend any further testing and signed off 02/26/22?# Right foot cellulites with fever - transient fever resolved, no leukocytosis - CXR neg, UA neg. CRP 25.8, procal neg. Xray feet with degenerative changes but no acute findings - Suspected source was R foot cellulitis- S/P CTX 2/21 and Zosyn 2/21-2/25; S/P Empiric ABX-Oxacillin --stopped with negative infectious work-up- Consulted Podiatry--- WBAT to BLE.No?indication for podiatric surgical intervention at this time. ?- Patient may follow-up with Dr. Gazes within 3-4 weeks of discharge.?- Trend fever/CBC- Tylenol prn?# Hypercalcemia - Now resolved- Per Endocrinology assessment :?	Hypercalcemia in the setting of PTH that is in mid reference range ( 28 -30) and elevated x 2 mid molecule PTH. ??	PTH is higher than would be expected for a non-PTH mediated mechanism of hypercalcemia. 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	Taking into consideration clinical scenario, hypercalcemia of immobilization is very likely contributing and probably the leading player in the etiology of hypercalcemia. ?	Risk of hypocalcemia exists but probably low in this patient. No additional recommendations. - S/P Zolendronic acid 4 mg X1 (3/21)- Trend Ca and Albumin as needed while IP?#  Abnormal LFTS ( resolved ) - attributed to DILI from antibiotics earlier - now resolved # Dysphagia - SLP eval 3/27 cleared for puree and thin liquids- Re-eval for diet upgrade later- resume TFs- FWF- Calorie counts- Requires 1: 1 feeding assistance- Aspiration precautions# CHRONIC PROBLEMS # RA Stable , Per Rheumatology evaluation : ?	No indication for additional immunosuppressive therapy. ?	Cont to hold MTX. ?	Use artifical tears PRN for dry eyes. ?	Mouth hygiene. If c/o dry mouth, can start on cevimeline 30  mg TID. ?	Will need to arrange follow up in [facility] clinic.# Recent diagnosis of DVT Per chart review, this was diagnosed incidentally on 2/1 [place] abdomen imaging ( Incidental note of expansile, hypodense thrombus within the bilateral common femoral veins, with fat stranding about the vessels. 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 Cytology of peritoneal nodules at that time as well was negative for malignancy as well. Duration of therapy remains unclear so far - she needs hematology follow up in the outpatient setting for further discussion. FULL CODE lovenox Tube feeds with tray ( dysphagia diet ) dispo : Awaiting T19-conservatorship 20 minutes spent in this encounter, >50% spent evaluating patient, reviewing labs/imaging, counseling with patient about plan of care, answering questions, and coordination of care.Electronically Signed:Tanessa Tidd A. Marquis Lunch, MD  Internal Medicine Hospitalist AttendingYale North Shore Surgicenter Contact via : [facility] or Smart Web4/08/2022, 2:18 PM

## 2022-03-31 LAB — CBC WITH AUTO DIFFERENTIAL
BKR WAM ABSOLUTE IMMATURE GRANULOCYTES.: 0.03 x 1000/ÂµL (ref 0.00–0.30)
BKR WAM ABSOLUTE LYMPHOCYTE COUNT.: 1.66 x 1000/ÂµL (ref 0.60–3.70)
BKR WAM ABSOLUTE NRBC (2 DEC): 0 x 1000/ÂµL (ref 0.00–1.00)
BKR WAM ANALYZER ANC: 2.98 x 1000/ÂµL (ref 2.00–7.60)
BKR WAM BASOPHIL ABSOLUTE COUNT.: 0.04 x 1000/ÂµL (ref 0.00–1.00)
BKR WAM BASOPHILS: 0.7 % (ref 0.0–1.4)
BKR WAM EOSINOPHIL ABSOLUTE COUNT.: 0.38 x 1000/ÂµL (ref 0.00–1.00)
BKR WAM EOSINOPHILS: 6.5 % — ABNORMAL HIGH (ref 0.0–5.0)
BKR WAM HEMATOCRIT (2 DEC): 35.1 % (ref 35.00–45.00)
BKR WAM HEMOGLOBIN: 10.9 g/dL — ABNORMAL LOW (ref 11.7–15.5)
BKR WAM IMMATURE GRANULOCYTES: 0.5 % (ref 0.0–1.0)
BKR WAM LYMPHOCYTES: 28.4 % (ref 17.0–50.0)
BKR WAM MCH (PG): 26.8 pg — ABNORMAL LOW (ref 27.0–33.0)
BKR WAM MCHC: 31.1 g/dL (ref 31.0–36.0)
BKR WAM MCV: 86.5 fL (ref 80.0–100.0)
BKR WAM MONOCYTE ABSOLUTE COUNT.: 0.75 x 1000/ÂµL (ref 0.00–1.00)
BKR WAM MONOCYTES: 12.8 % — ABNORMAL HIGH (ref 4.0–12.0)
BKR WAM MPV: 10.2 fL (ref 8.0–12.0)
BKR WAM NEUTROPHILS: 51.1 % (ref 39.0–72.0)
BKR WAM NUCLEATED RED BLOOD CELLS: 0 % (ref 0.0–1.0)
BKR WAM PLATELETS: 305 x1000/ÂµL (ref 150–420)
BKR WAM RDW-CV: 16.3 % — ABNORMAL HIGH (ref 11.0–15.0)
BKR WAM RED BLOOD CELL COUNT.: 4.06 M/ÂµL (ref 4.00–6.00)
BKR WAM WHITE BLOOD CELL COUNT: 5.8 x1000/ÂµL (ref 4.0–11.0)

## 2022-03-31 LAB — BASIC METABOLIC PANEL
BKR ANION GAP: 10 (ref 7–17)
BKR BLOOD UREA NITROGEN: 12 mg/dL (ref 8–23)
BKR BUN / CREAT RATIO: 37.5 — ABNORMAL HIGH (ref 8.0–23.0)
BKR CALCIUM: 10 mg/dL (ref 8.8–10.2)
BKR CHLORIDE: 105 mmol/L (ref 98–107)
BKR CO2: 22 mmol/L (ref 20–30)
BKR CREATININE: 0.32 mg/dL — ABNORMAL LOW (ref 0.40–1.30)
BKR EGFR, CREATININE (CKD-EPI 2021): >60 mL/min/{1.73_m2} (ref >=60)
BKR GLUCOSE: 101 mg/dL — ABNORMAL HIGH (ref 70–100)
BKR POTASSIUM: 4.3 mmol/L (ref 3.3–5.3)
BKR SODIUM: 137 mmol/L (ref 136–144)

## 2022-03-31 LAB — PHOSPHORUS     (BH GH L LMW YH): BKR PHOSPHORUS: 2.8 mg/dL (ref 2.2–4.5)

## 2022-03-31 LAB — MAGNESIUM: BKR MAGNESIUM: 1.9 mg/dL (ref 1.7–2.4)

## 2022-03-31 MED ORDER — GABAPENTIN 100 MG CAPSULE
100 mg | Freq: Two times a day (BID) | ORAL | Status: DC | PRN
Start: 2022-03-31 — End: 2022-04-01

## 2022-03-31 NOTE — Plan of Care
Plan of Care Overview/ Patient Status    A&Ox1-2, VSS. C/o BLE pain L > R, provider made aware and 1x dose Tramadol ordered. C/o mild nausea without emesis, PRN zofran given and nausea resolved. Patient repositioned Q2, remains on specialty bed, and boots on. BLE dressings changed per MAR/Podietry note.Observed patient scratching buttocks and scant blood noted, Bed bath performed and zguard applied. Tolerating PO diet and tube feed given per MAR. Resting with bed in lowest position and fall bundle in place.

## 2022-03-31 NOTE — Progress Notes
Inpatient Occupational Therapy Progress NoteDefault Flowsheet Data (most recent)   IP Adult OT Eval/Treat - 03/31/22 0801    Date of Visit / Treatment  Date of Visit / Treatment 03/31/22   Note Type Progress Note   Progress Report Due 04/14/22   End Time 0801    General Information  Pertinent History Of Current Problem Per Chart: This is a 66 y.o. female PMHx of RA, recent prolonged hospitalization for perforated diverticulitis (s/p ex Lap/R hemicolectomy with end ileostomy) admitted for persistent encephalopathy/FTT and likely need for LTACH with work up revealing fever with unclear etiology (resolved). Has undergone extensive work-up of encephalopathy and leg weakness which is unremarkable. Course c/b elevated LFTs which resolved, likely 2/2 DILI from Ceftriaxone or Zosyn. Mental status appears overall stable   Precautions/Limitations Fall Precautions   Precautions/Limitations Comment WBAT BLE    Vital Signs and Orthostatic Vital Signs  Vital Signs Vital Signs Stable    Pain/Comfort  Location #1 - PreTreatment Rating (Numbers Scale) 0/10 - no pain   Posttreatment Rating (Numbers Scale) 0/10 - no pain    Cognition  Orientation Level Oriented to person   Level of Consciousness alert;confused   Following Commands Follows one step commands without difficulty   Personal Safety / Judgment Fall risk    Vision/ Hearing  Vision Assessment Results No vision deficits noted    Musculoskeletal  LUE Muscle Strength Grading 2-->active movement with gravity eliminated   RUE Muscle Strength Grading 2-->active movement with gravity eliminated   LLE Muscle Strength Grading 2-->active movement with gravity eliminated   RLE Muscle Strength Grading 2-->active movement with gravity eliminated    Muscle Tone  Muscle Tone Testing Results No muscle tone deficits noted    Coordination  Opposition Impaired bilaterally    Skin Assessment  Skin Assessment See Nursing Documentation    Balance  Sitting Balance: Static  POOR      Moderate assist to maintain static position with no Assistive Device   Sitting Balance: Dynamic  POOR-    Unable to move from midline voluntarily, maximal assist to right self   Standing Balance: Static POOR-     Maximal assist to maintain static position with no Assistive Device   Standing Balance: Dynamic  POOR-    Unable to move from midline voluntarily, maximal assist to right self     AM-PAC - Daily Activity IP Short Form  Help needed from another person putting on/taking off regular lower body clothing 2 - A Lot   Help needed from another person for bathing (incl. washing, rinsing, drying) 2 - A Lot   Help needed from another person for toileting (incl. using toilet, bedpan, urinal) 2 - A Lot   Help needed from another person putting on/taking off regular upper body clothing 2 - A Lot   Help needed from another person taking care of personal grooming such as brushing teeth 2 - A Lot   Help needed from another person eating meals 2 - A Lot   AM-PAC Daily Activity Raw Score (Total of rows above) 12   CMS Score (based on Raw Score - with G Code) 12 - 66.57% impaired      (G Code - CL)    Bed Mobility  Supine-to-Sit Independence/Assistance Level Maximum assist   Supine-to-Sit Assist Device Hand held assist   Sit-to-Supine Independence/Assistance Level Maximum assist   Sit-to-Supine Assist Device Hand held assist    Sit-Stand Transfer Training  Sit-to-Stand Transfer Independence/Assistance Level Maximum assist  Sit-to-Stand Transfer Assist Device Hand held assist   Stand-to-Sit Transfer Independence/Assistance Level Maximum assist   Stand-to-Sit Transfer Assist Device Hand held assist    Endurance  Endurance Comments fair-    Therapeutic Functional Activity  Therapeutic Functional Activity Comments Bed mobility; transfer training; Pt edu    Clinical Impression Impairments Found (describe specific impairments) attention;BADL's    Frequency/Equipment Recommendations  OT Frequency 2x per week   What day of week is next treatment expected? Thursday   4/13   OT Discharge Summary  Disposition Recommendation Short Term Rehab

## 2022-03-31 NOTE — Plan of Care
Inpatient Physical Therapy Re-Evaluation IP Adult PT Eval/Treat - 03/31/22 1042    Date of Visit / Treatment  Date of Visit / Treatment 03/31/22   Note Type Re-Evaluation   Progress Report Due 04/30/22   End Time 1042    General Information  Pertinent History Of Current Problem Per Chart: This is a 66 y.o. female PMHx of RA, recent prolonged hospitalization for perforated diverticulitis (s/p ex Lap/R hemicolectomy with end ileostomy) admitted for persistent encephalopathy/FTT and likely need for LTACH with work up revealing fever with unclear etiology (resolved). Has undergone extensive work-up of encephalopathy and leg weakness which is unremarkable. Course c/b elevated LFTs which resolved, likely 2/2 DILI from Ceftriaxone or Zosyn. Mental status appears overall stable.   Subjective  Not another one.   General Observations Pt received supine in bed; on RA; NAD; tele; ostomy; B boots; RN cleared   Precautions/Limitations Fall Precautions;Bed alarm;Chair alarm   Precautions/Limitations Comment WBAT BLE    Weight Bearing Status  Weight Bearing Status Comments WBAT LLE    Prior Level of Functioning/Social History  Additional Comments Pt was brought to [facility] by her family from [facility]. Her sister, [name] is in the process of conservatorship. PLOF is unclear as she has had a prolonged hospital course.    Vital Signs and Orthostatic Vital Signs  Vital Signs Vital Signs Stable   Vital Signs Free text RA; NAD    Pain/Comfort  Pain Comment (Pre/Post Treatment Pain) Unquantified verbal and facial grimace w/ mobility of BLE; RN aware and repositioned for comfort    Patient Coping  Observed Emotional State accepting;cooperative   Verbalized Emotional State acceptance    Cognition  Overall Cognitive Status Impaired   Orientation Level Oriented to person   Level of Consciousness alert;confused   Following Commands Follows one step commands with increased time;Follows one step commands with repetition   Personal Safety / Judgment Fall risk;Requires supervision with mobility   Attention Requires redirection   Affect Flat   Cognition Comments Easily distracted    Vision/ Hearing  Vision Assessment Results No vision deficits noted    Range of Motion  Range of Motion Examination WFL except   Range of Motion Comments Tightness to B shoulders, HM, and heel cords    Manual Muscle Testing  Manual Muscle Testing Comments + Deconditioning    Musculoskeletal  LUE Muscle Strength Grading 2-->active movement with gravity eliminated   RUE Muscle Strength Grading 2-->active movement with gravity eliminated   LLE Muscle Strength Grading 2-->active movement with gravity eliminated   RLE Muscle Strength Grading 2-->active movement with gravity eliminated    Muscle Tone  Muscle Tone Testing Results No muscle tone deficits noted    Coordination  Opposition Impaired bilaterally   Fine Motor Coordination Grossly impaired   Coordination Comments Grossly impaired    Sensory Assessment  Sensory Tests Results No sensory impairment noted   Sensory Assessment Comments Verbal and facial grimaces to LT to BLE    Skin Assessment  Skin Assessment See Nursing Documentation    Balance  Sitting Balance: Static  POOR      Moderate assist to maintain static position with no Assistive Device   Sitting Balance: Dynamic  POOR-    Unable to move from midline voluntarily, maximal assist to right self   Standing Balance: Static POOR-     Maximal assist to maintain static position with no Assistive Device   Standing Balance: Dynamic  POOR-    Unable to move from midline voluntarily,  maximal assist to right self   Balance Assist Device Hand held assist   Balance Skills Training Comment Ax2    Bed Mobility  Supine-to-Sit Independence/Assistance Level Maximum assist;Assist of 2   Supine-to-Sit Assist Device Head of bed elevated;Hand held assist;Draw pad   Sit-to-Supine Independence/Assistance Level Maximum assist;Assist of 2   Sit-to-Supine Assist Device Hand held assist   Bed Mobility Comments Retropulsive and R lateral lean w/ static sitting requiring HHA to prevent LOB    Sit-Stand Transfer Training  Sit-to-Stand Transfer Independence/Assistance Level Maximum assist;Assist of 2   Sit-to-Stand Transfer Assist Device Hand held assist   Stand-to-Sit Transfer Independence/Assistance Level Maximum assist;Assist of 2   Stand-to-Sit Transfer Assist Device Hand held assist   Sit-Stand Transfer Comments VCs for upright, unable to achieve; B knee block    Gait Training  Gait Training Comments NT d/t weakness and unsteadiness    Handoff Documentation  Handoff Patient in bed;Bed alarm;Patient instructed to call nursing for mobility;Discussed with nursing    Endurance  Endurance Comments Fair-    PT- AM-PAC - Basic Mobility Screen- How much help from another person do you currently need.....  Turning from your back to your side while in a a flat bed without using rails? 1 - Total - Requires total assistance or cannot do it at all.   Moving from lying on your back to sitting on the side of a flat bed without using bed rails? 1 - Total - Requires total assistance or cannot do it at all.   Moving to and from a bed to a chair (including a wheelchair)? 1 - Total - Requires total assistance or cannot do it at all.   Standing up from a chair using your arms(e.g., wheelchair or bedside chair)? 1 - Total - Requires total assistance or cannot do it at all.   To walk in a hospital room? 1 - Total - Requires total assistance or cannot do it at all.   Climbing 3-5 steps with a railing? 1 - Total - Requires total assistance or cannot do it at all.   AMPAC Mobility Score 6   TARGET Highest Level of Mobility Mobility Level 2, Turn self in bed/bed activity/dependent transfer    ACTUAL Highest Level of Mobility Mobility Level 3, Sit on edge of bed    Therapeutic Exercise  Therapeutic Exercise Comments AROM x 4    Neuromuscular Re-education  Neuromuscular Re-education Comments Seated static and dynamic postural control training    Therapeutic Functional Activity  Therapeutic Functional Activity Comments Bed mobility; transfer training; Pt edu    Clinical Impression  Follow up Assessment RE-EVAL (4/10): Pt is a 66 y/o female who presents to PT for a re-eval for encephalopathy c/b B leg weakness. Primary impairments consists of impaired cognition, decreased postural control, decreased ROM, gross weakness/deconditioning, and poor activity tolerance. Requires maxAx2 for bed mobility and transfers w/ HHA. Displays poor trunk control seated EOB as noted by retropulsion and R lateral lean. Will benefit from skilled services to address aforementioned impairments. Current PT d/c rec is STR.   Criteria for Skilled Therapeutic Interventions Met yes    Patient/Family Stated Goals  Patient/Family Stated Goal(s) feel better    Frequency/Equipment Recommendations  PT Frequency 2x per week   What day of week is next treatment expected? Thursday   4/13  PT/PTA completing this assessment RL   Equipment Needs During Admission/Treatment No device    PT Recommendations for Inpatient Admission  Activity/Level of Assist mechanical lift;assist of  2    Planned Treatment / Interventions  Plan for Next Visit Progress as tol   Training Treatment / Interventions Balance / Gait Training;Functional Mobility Public librarian;Endurance Training   Education Treatment / Interventions Patient Education / Training    PT Discharge Summary  Physical Therapy Disposition Recommendation Short Term Rehab     Problem: Physical Therapy GoalsGoal: Physical Therapy GoalsDescription: PT GOALS - addressed 4/10/231. Patient will demonstrate active range of motion x 4 extremities in preparation for mobility (GOAL MET)2. Caregivers will be independent and safely implement a range of motion/positioning program to prevent loss of range of motion and skin integrity (ONGOING)3. Patient will perform bed mobility with maximal assist (GOAL MET)4. Patient will tolerate sitting edge of bed >5 minutes with minimal assist (ONGOING)5. Patient will perform transfers with moderate assist of 2 using rolling walker (ONGOING)6. Pt will perform bed mobility w/ modAx1 (NEW) Outcome: Revised Shanon Payor PT, DPTMHB: [phone]

## 2022-03-31 NOTE — Progress Notes
[facility]	 [facility]	Daily Progress NoteHospital Medicine ServiceAttending Provider: Beckie Busing, MD [phone]:  9822/9822-AHospital Day #48 Subjective: Patient seen and examined on 03/31/2022, 2:39 PM.CC: Fever, unspecified fever causeInterim History: -Frustrated about being in the hospital when she 'didn't do anything wrong' -No specific complaints or concernsObjective: Current Meds:banana flakes-t-galactooligos., 1 packet, BIDchlorhexidine gluconate, 15 mL, BIDElemental iron, 45 mg, BIDenoxaparin (LOVENOX) treatment, 80 mg, Q12Hfamotidine, 20 mg, Dailyfolic acid, 1 mg, Dailygabapentin, 100 mg, Nightlyniacin, 100 mg, Nightlynystatin, 1 Application, BIDpyridoxine (vitamin B6), 100 mg, Daily[Held by provider] rosuvastatin, 10 mg, Nightlysodium chloride, 3 mL, Q8Hthiamine, 100 mg, BID Vitals:Temp:  [97.3 ?F (36.3 ?C)-98.9 ?F (37.2 ?C)] 97.3 ?F (36.3 ?C)Pulse:  [77-94] 77Resp:  [16-19] 18BP: (99-116)/(67-79) 116/79SpO2:  [94 %-100 %] 97 %Device (Oxygen Therapy): room airRecent Labs   04/08/230525 04/10/231014 GLU 95 101* I/O's:Intake/Output Summary (Last 24 hours) at 03/31/2022 1439Last data filed at 03/31/2022 1342Gross per 24 hour Intake 1632 ml Output 450 ml Net 1182 ml Physical Exam GENERAL: The patient is well developed and nontoxic.HEART: Regular rate and rhythm without murmurs, rubs, or gallopsLUNGS: Clear to auscultation bilaterally, without wheezing, rhonchi, or cracklesSKIN: No rash, no excessive bruising, petechiae, or purpura. No Lower extremity edema NEUROLOGIC: Cranial nerves II-XII grossly intact without motor/sensory deficitDiagnostics:I have reviewed new labs and imaging over the last 24 hours with the following notable findings:Cr is 0.32 Calcium is 10.0Assessment: 66 y.o. 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. 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 She was evaluated by heme for elevated K/L ratio on FLC analysis thought to be inflammatory in nature. Heme consult note 02/20/2022-Currently on Lovenox 80mg  q12h (had been on DOAC in past) -F/u on additional heme recs from past consult Encephalopathy - resolved LE weakness and pain - paraneoplastic and autoimmune panels negative. MRI brain and spine unremarkable. Neurology recommended EMG which patient declined. Neuro signed off 02/26/22. Right foot cellulitis - resolved. S/p abx and Xray. No additional needs -F/u with Dr. Gazes 3-4 weeks after discharge Hypercalcemia - PTH in mid reference range 28-30, and 2x elevated molecular PTH. This is higher than expected for non-PTH mediated mechanism so primary hyperparathyroidism is still on differential. However, not thought to be the only contributor to her high calcium. Hypercalcemia of immobility thought to be contributing - s/p zolendronic acid 4mg  x 1 on 03/11/22 -Calcium is 10 Dysphagia - SLP Eval was on 3/27 for puree and thin liquids -Re-evaluate now as it has been 2 weeks RA - stable -No indication for additional immunosuppressive therapy per rheum -HOLD MTX - rheum follow up once d/c'd -If dry mouth continues, can start cevimeline 30mg  TID Goals of Care and Capacity - during hospitalization in November 2022 with perforated diverticulum, deemed at this time not to have capacity. PEG was ultimately placed and TFs were initiated -At this time, awaiting conservatorship with her sister -Awaiting T19 and LTAC Diet Tube Feed With TrayVTE PPx: Lovenox Full CodeSigned:Amarii Amy Kang-Giaimo MD MPH Attending Hospitalist - Program for [facility] MedicineDepartment of  Internal Medicine, [place] UniversityReachable via MHB4/10/20232:39 PM

## 2022-03-31 NOTE — Plan of Care
Plan of Care Overview/ Patient Status    0700-1900A&O: to selfResp: WDL on room airGI/GU: ostomy. Incontinent of urine. Diet: chopped, honey thick liquids. TF Jevity bolus feeds given per orders, tolerated well.  Skin: see flowsheetsPain: bilateral leg pain, mostly complaining of left leg. Prn tylenol given with moderate effectMobility: total Access: PIV RUEPills: wholeSafety maintained, call bell in reach, bed alarm set, rounding continued.  Vitals:  03/31/22 0350 03/31/22 0809 03/31/22 1209 03/31/22 1704 BP: 116/76 99/67 116/79 101/74 Pulse: 82 84 77 89 Resp: 18 18 18 20  Temp: 97.6 ?F (36.4 ?C) 97.8 ?F (36.6 ?C) 97.3 ?F (36.3 ?C) 98.3 ?F (36.8 ?C) TempSrc: Axillary Axillary Oral Oral SpO2: 97% 94% 97% 98% Weight:  72.7 kg   Height:

## 2022-04-01 LAB — CBC WITH AUTO DIFFERENTIAL
BKR WAM ABSOLUTE IMMATURE GRANULOCYTES.: 0.01 x 1000/ÂµL (ref 0.00–0.30)
BKR WAM ABSOLUTE LYMPHOCYTE COUNT.: 1.95 x 1000/ÂµL (ref 0.60–3.70)
BKR WAM ABSOLUTE NRBC (2 DEC): 0 x 1000/ÂµL (ref 0.00–1.00)
BKR WAM ANALYZER ANC: 2.58 x 1000/ÂµL (ref 2.00–7.60)
BKR WAM BASOPHIL ABSOLUTE COUNT.: 0.07 x 1000/ÂµL (ref 0.00–1.00)
BKR WAM BASOPHILS: 1.2 % (ref 0.0–1.4)
BKR WAM EOSINOPHIL ABSOLUTE COUNT.: 0.57 x 1000/ÂµL (ref 0.00–1.00)
BKR WAM EOSINOPHILS: 9.6 % — ABNORMAL HIGH (ref 0.0–5.0)
BKR WAM HEMATOCRIT (2 DEC): 32.1 % — ABNORMAL LOW (ref 35.00–45.00)
BKR WAM HEMOGLOBIN: 10.3 g/dL — ABNORMAL LOW (ref 11.7–15.5)
BKR WAM IMMATURE GRANULOCYTES: 0.2 % (ref 0.0–1.0)
BKR WAM LYMPHOCYTES: 32.9 % (ref 17.0–50.0)
BKR WAM MCH (PG): 27.6 pg (ref 27.0–33.0)
BKR WAM MCHC: 32.1 g/dL (ref 31.0–36.0)
BKR WAM MCV: 86.1 fL (ref 80.0–100.0)
BKR WAM MONOCYTE ABSOLUTE COUNT.: 0.75 x 1000/ÂµL (ref 0.00–1.00)
BKR WAM MONOCYTES: 12.6 % — ABNORMAL HIGH (ref 4.0–12.0)
BKR WAM MPV: 10.6 fL (ref 8.0–12.0)
BKR WAM NEUTROPHILS: 43.5 % (ref 39.0–72.0)
BKR WAM NUCLEATED RED BLOOD CELLS: 0 % (ref 0.0–1.0)
BKR WAM PLATELETS: 279 x1000/ÂµL (ref 150–420)
BKR WAM RDW-CV: 16.3 % — ABNORMAL HIGH (ref 11.0–15.0)
BKR WAM RED BLOOD CELL COUNT.: 3.73 M/ÂµL — ABNORMAL LOW (ref 4.00–6.00)
BKR WAM WHITE BLOOD CELL COUNT: 5.9 x1000/ÂµL (ref 4.0–11.0)

## 2022-04-01 LAB — COMPREHENSIVE METABOLIC PANEL
BKR A/G RATIO: 0.7 — ABNORMAL LOW (ref 1.0–2.2)
BKR ALANINE AMINOTRANSFERASE (ALT): 12 U/L (ref 10–35)
BKR ALBUMIN: 3.1 g/dL — ABNORMAL LOW (ref 3.6–4.9)
BKR ALKALINE PHOSPHATASE: 72 U/L (ref 9–122)
BKR ANION GAP: 9 (ref 7–17)
BKR ASPARTATE AMINOTRANSFERASE (AST): 18 U/L (ref 10–35)
BKR AST/ALT RATIO: 1.5
BKR BILIRUBIN TOTAL: 0.2 mg/dL (ref <=1.2)
BKR BLOOD UREA NITROGEN: 10 mg/dL (ref 8–23)
BKR BUN / CREAT RATIO: 32.3 — ABNORMAL HIGH (ref 8.0–23.0)
BKR CALCIUM: 9.8 mg/dL (ref 8.8–10.2)
BKR CHLORIDE: 105 mmol/L (ref 98–107)
BKR CO2: 23 mmol/L (ref 20–30)
BKR CREATININE: 0.31 mg/dL — ABNORMAL LOW (ref 0.40–1.30)
BKR EGFR, CREATININE (CKD-EPI 2021): >60 mL/min/{1.73_m2} (ref >=60)
BKR GLOBULIN: 4.4 g/dL — ABNORMAL HIGH (ref 2.3–3.5)
BKR GLUCOSE: 93 mg/dL (ref 70–100)
BKR POTASSIUM: 3.8 mmol/L (ref 3.3–5.3)
BKR PROTEIN TOTAL: 7.5 g/dL (ref 6.6–8.7)
BKR SODIUM: 137 mmol/L (ref 136–144)

## 2022-04-01 MED ORDER — GABAPENTIN 100 MG CAPSULE
100 mg | Freq: Three times a day (TID) | ORAL | Status: DC
Start: 2022-04-01 — End: 2022-04-02
  Administered 2022-04-02 (×2): 100 mg via ORAL

## 2022-04-01 NOTE — Plan of Care
Adult Speech and Language PathologySwallow Evaluation4/11/2023Patient Name:  Danielle MaloneMR#:  [name] Date of Birth:  Dec 24, 1957Therapist:  Doloris Hall, SLP SLP IP Adult General Information - 04/01/22 1124    General Information  Subjective You look fabulous!   Medical Diagnosis Per chart: 66 y.o. 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   Pertinent History of Current Problem Most recent FEES 3/27 with recommendation for a puree solid diet and thin liquids. During the exam, the patient refused to trial an advanced solid. Follow up completed at bedside on 4/05. Prolonged mastication appreciated. Patient requested liquid wash. Complete oral clearance was achieved. Recommended diet at that time was soft/ chopped solids and thin liquids.   Primary Concern Swallow function.   Prior Level of Functioning Patient with PEG from OSH, however, no documented hx of swallow evaluation/dysphagia found in EMR prior to current admission.      SLP IP Adult Pain/Comfort - 04/01/22 1124    Pain/Comfort  Pain Comment (Pre/Post Treatment Pain) no c/o pain   Pain Rating at Rest 0/10 - no pain      SLP IP Adult Oral/Motor Assessment - 04/01/22 1124    Oral/Motor Function  Labial Function WFL - Within Functional Limits   Lingual Function WFL - Within Functional Limits   Facial  WFL - Within Functional Limits   Dentition Sparse dentition      SLP IP Adult Cognition - 04/01/22 1124    Cognition / Mental Functioning       Cognitive Status Comments Patient alert.      SLP IP Adult Clinical/Bedside Swallow Evaluation - 04/01/22 1124    Clinical / Bedside Swallow Evaluation  Type of Study Repeat   Reason for Study --   To determine candidacy for solid upgrade  Patients Current Diet Chopped diet;Thin liquids   Patient Position Bed;Upright   Patient should be NPO No   Diet Recommendations Regular solids;Thin liquids   Medication Administration whole;with liquid   Recommendations Eat slowly;Sit upright with eating and drinking;Small sips / bites   Patients feeding ability Patient needs assistance   Aspiration Precautions yes        Precautions include: Small bolus sizes;Head of bed 90 degrees/Chair;Slow feeding   Discontinue PO if patient experiences: Increased temperature;Increased coughing;Increased fatigue   Follow Up Patient seen for instrumental swallow evaluation (FEES) on 03/17/2022. Study revealed pharyngeal residue with  thin liquids in continuous sips. No visible laryngeal penetration or aspiration appreciated. Recommended diet at that time was pureed solids and thin liquids. Patient met again at bedside on 4/05. Patient was administered regular solid. Prolonged mastication appreciated. Patient requested liquid wash. Complete oral clearance was achieved. Recommended diet at that time was soft/ chopped solids and thin liquids. Patient seen today at bedside to assess diet tolerance and determine candidacy for solid upgrade.  Patient was reeducated on 3/27 FEES and 4/05 bedside  results and general aspiration precautions. Patient reports no coughing/ throat clearing. Patient administered the recommended thin liquid. No overt s/sx of dysphagia appreciated. Patient was administered a regular solid. Mildly prolonged mastication appreciated, however, functional w/ full clearance. All questions answered by SLP. RN reeducated on SLP recommendations. See recommendations below.      SLP IP Adult Recommendations - 04/01/22 1124    Recommendations  SLP Therapy Frequency 1x per week  SLP IP Adult Inpatient Recommendations - 04/01/22 1124    SLP Recommendations for Inpatient AdmissionSwallow Recommendations ZOX:WRUEAVW solids and thin liquids. Meds whole w/ thin liquids. Strict daily oral care. Team can reconsult for reevaluation pending change to overall clinical status/ concern for diet tolerance.          SLP IP Adult Discharge Summary - 04/01/22 1124    Discharge Summary  Disposition Recommendation(s) To be determined pending progress     Doloris Hall, M.S., CF-SLPSpeech-Language PathologistMHB: [phone]/10/2022 Problem: Speech Language Pathology GoalsGoal: Dysphagia Goals, SLPDescription: Current goal: Patient will tolerate regular solids and thin liquids without adverse impact on respiratory status and while maintaining adequate oral nutrition.Prior goal (met): The patient will tolerate least restrictive diet without any decline in respiratory status.Prior goal (met): Pt will tolerate pureed diet with thin liquids w/o s/x of aspiration or adverse impact on respiratory status.Outcome: Interventions implemented as appropriate

## 2022-04-01 NOTE — Plan of Care
Plan of Care Overview/ Patient Status    Medical Social Work Follow Up  [employer] Most Recent Value Admission Information  Document Type Progress Note Reason for Inability to Assess --  [Patient mental status is altered] (For Inpatient/ED Only) Prior psychosocial assessment has been documented within this hospitalization No (For Inpatient/ED Only) Prior psychosocial assessment has been documented within 30 days of this hospitalization No Reason for Current Social Work Involvement Conservatorship Source of Information Family/Caregiver Family/Caregiver Comment Jacquenette Shone Record Reviewed Yes Level of Care Inpatient Psychosocial issues requiring intervention Conservatorship Psychosocial interventions 10 minutes spent on the phone with Ms. Harkins sister, Jacquenette Shone [phone]. [name] reported that she has the paperwork for the conservatorship application but that there is a portion that the doctor needs to complete. I informed her that would be the physician's evaluation. She reported that she did not have the paperwork in front of her as she was out of the house. I reported that I would contact her tomorrow and we would review the paperwork over the phone and if a physician's evaluation is needed I can inform the Medical Team. [name] appreciated my assistance and I thanked her for her time. Collaborations [employer] (sister) Specific referrals to enhance community supports (include existing and new resources) Capacity evaluation, Management consultant, T-19 application submission Handoff Required? No Next Steps/Plan (including hand-off): I will remain involved for support as needed. Signature: Leim Fabry, LMSW Contact Information: [phone]

## 2022-04-01 NOTE — Plan of Care
Plan of Care Overview/ Patient Status    0700-1100A&O: to self and placeResp: WDL on room airGI/GU: ostomy. Incontinent of urine intermittently, calling for bed pan this AM. Diet: SLP today, see note. TF Jevity bolus feeds given per orders, tolerated well.  Skin: see flowsheetsPain: bilateral leg pain, mostly complaining of left leg. Prn tylenol given with moderate effectMobility: total Access: PIV RUEPills: crushed via PEGSafety maintained, call bell in reach, bed alarm set, rounding continued.  Vitals:  03/31/22 2032 03/31/22 2320 04/01/22 0344 04/01/22 0756 BP: 109/67 102/61 117/75 107/67 Pulse: 90 (!) 92 80 80 Resp: 18 18 18 18  Temp: 97.3 ?F (36.3 ?C) 98.9 ?F (37.2 ?C) 98.7 ?F (37.1 ?C) 97.4 ?F (36.3 ?C) TempSrc: Oral Oral Oral  SpO2: 98% 95% 97% 95% Weight:     Height:

## 2022-04-01 NOTE — Plan of Care
Plan of Care Overview/ Patient Status    A&Ox1, VSS. Q2 turns performed. Bathed and linens changed. Tolerating tube feed and oral intake. R Ileostomy changed per policy. Patient resting with bed in lowest position, call light within reach, and fall bundle in place.

## 2022-04-01 NOTE — Progress Notes
[facility]	 [facility]	Daily Progress NoteHospital Medicine ServiceAttending Provider: Beckie Busing, MD [phone]:  9822/9822-AHospital Day #49 Subjective: Patient seen and examined on 04/01/2022, 2:39 PM.CC: Fever, unspecified fever causeInterim History: -leg pain is concern today - tells me it is more of a burning -Otherwise, she is feeling well and ROS negativeObjective: Current Meds:banana flakes-t-galactooligos., 1 packet, BIDchlorhexidine gluconate, 15 mL, BIDElemental iron, 45 mg, BIDenoxaparin (LOVENOX) treatment, 80 mg, Q12Hfamotidine, 20 mg, Dailyfolic acid, 1 mg, Dailygabapentin, 100 mg, Nightlyniacin, 100 mg, Nightlynystatin, 1 Application, BIDpyridoxine (vitamin B6), 100 mg, Daily[Held by provider] rosuvastatin, 10 mg, Nightlysodium chloride, 3 mL, Q8Hthiamine, 100 mg, BID Vitals:Temp:  [97.3 ?F (36.3 ?C)-98.9 ?F (37.2 ?C)] 98.3 ?F (36.8 ?C)Pulse:  [80-92] 92Resp:  [18-20] 18BP: (101-117)/(61-75) 105/67SpO2:  [95 %-98 %] 98 %Device (Oxygen Therapy): room airRecent Labs   04/10/231014 04/11/230416 GLU 101* 93 I/O's:Intake/Output Summary (Last 24 hours) at 04/01/2022 1426Last data filed at 04/01/2022 1300Gross per 24 hour Intake 1260 ml Output 550 ml Net 710 ml Physical Exam GENERAL: The patient is well developed and nontoxic.HEART: Regular rate and rhythm without murmurs, rubs, or gallopsLUNGS: Clear to auscultation bilaterally, without wheezing, rhonchi, or cracklesABDOMEN: 2 colostomy bags noted. Soft non tender SKIN: No rash, no excessive bruising, petechiae, or purpura. No Lower extremity edema NEUROLOGIC: Cranial nerves II-XII grossly intact without motor/sensory deficitDiagnostics:I have reviewed new labs and imaging over the last 24 hours with the following notable findings:Cr is 0.31 Calcium is 9.8Assessment: 66 y.o. 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 [place]. 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 Per 3/30 notes, seems imaging was reviewed and that nodules were improving and thought to be resolution of continued debris. LFTs improved as well. DVT - diagnosed incidentally on 01/22/22 [place] imaging - b/l femoral DVTs. She was evaluated by heme for elevated K/L ratio on FLC analysis thought to be inflammatory in nature. Heme consult note 02/20/2022-Currently on Lovenox 80mg  q12h (had been on DOAC in past and seems plan was to go back to DOAC at discharge as long as no more procedures, etc). Encephalopathy - resolved LE weakness and pain - paraneoplastic and autoimmune panels negative. MRI brain and spine unremarkable. Neurology recommended EMG which patient declined. Neuro signed off 02/26/22. -Will add gabapentin 100mg  TID (Rather than just at night) for her nerve like pain that she describesRight foot cellulitis - resolved. S/p abx and Xray. No additional needs -F/u with Dr. Gazes 3-4 weeks after discharge Hypercalcemia - PTH in mid reference range 28-30, and 2x elevated molecular PTH. This is higher than expected for non-PTH mediated mechanism so primary hyperparathyroidism is still on differential. However, not thought to be the only contributor to her high calcium. 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 RA - stable -No indication for additional immunosuppressive therapy per rheum -HOLD MTX -[place] rheum follow up once d/c'd -If dry mouth continues, can start cevimeline 30mg  TID Goals of Care and Capacity - during hospitalization in November 2022 with perforated diverticulum, deemed at this time not to have capacity. PEG was  ultimately placed and TFs were initiated -At this time, awaiting conservatorship with her sister -Awaiting T19 and LTAC Diet Tube Feed With TrayVTE PPx: Lovenox Full CodeSigned:Sundeep Cary Kang-Giaimo MD MPH Attending Hospitalist - Program for Hospital MedicineDepartment of Internal Medicine, Woodlynne UniversityReachable via MHB4/11/20232:39 PM

## 2022-04-02 ENCOUNTER — Inpatient Hospital Stay: Admit: 2022-04-02 | Payer: PRIVATE HEALTH INSURANCE

## 2022-04-02 DIAGNOSIS — R627 Adult failure to thrive: Secondary | ICD-10-CM

## 2022-04-02 LAB — CBC WITH AUTO DIFFERENTIAL
BKR WAM ABSOLUTE IMMATURE GRANULOCYTES.: 0.02 x 1000/ÂµL (ref 0.00–0.30)
BKR WAM ABSOLUTE LYMPHOCYTE COUNT.: 1.7 x 1000/ÂµL (ref 0.60–3.70)
BKR WAM ABSOLUTE NRBC (2 DEC): 0 x 1000/ÂµL (ref 0.00–1.00)
BKR WAM ANALYZER ANC: 3.04 x 1000/ÂµL (ref 2.00–7.60)
BKR WAM BASOPHIL ABSOLUTE COUNT.: 0.04 x 1000/ÂµL (ref 0.00–1.00)
BKR WAM BASOPHILS: 0.7 % (ref 0.0–1.4)
BKR WAM EOSINOPHIL ABSOLUTE COUNT.: 0.35 x 1000/ÂµL (ref 0.00–1.00)
BKR WAM EOSINOPHILS: 6 % — ABNORMAL HIGH (ref 0.0–5.0)
BKR WAM HEMATOCRIT (2 DEC): 34.7 % — ABNORMAL LOW (ref 35.00–45.00)
BKR WAM HEMOGLOBIN: 10.8 g/dL — ABNORMAL LOW (ref 11.7–15.5)
BKR WAM IMMATURE GRANULOCYTES: 0.3 % (ref 0.0–1.0)
BKR WAM LYMPHOCYTES: 29.1 % (ref 17.0–50.0)
BKR WAM MCH (PG): 27.2 pg (ref 27.0–33.0)
BKR WAM MCHC: 31.1 g/dL (ref 31.0–36.0)
BKR WAM MCV: 87.4 fL (ref 80.0–100.0)
BKR WAM MONOCYTE ABSOLUTE COUNT.: 0.69 x 1000/ÂµL (ref 0.00–1.00)
BKR WAM MONOCYTES: 11.8 % (ref 4.0–12.0)
BKR WAM MPV: 10.6 fL (ref 8.0–12.0)
BKR WAM NEUTROPHILS: 52.1 % (ref 39.0–72.0)
BKR WAM NUCLEATED RED BLOOD CELLS: 0 % (ref 0.0–1.0)
BKR WAM PLATELETS: 283 x1000/ÂµL (ref 150–420)
BKR WAM RDW-CV: 16.4 % — ABNORMAL HIGH (ref 11.0–15.0)
BKR WAM RED BLOOD CELL COUNT.: 3.97 M/ÂµL — ABNORMAL LOW (ref 4.00–6.00)
BKR WAM WHITE BLOOD CELL COUNT: 5.8 x1000/ÂµL (ref 4.0–11.0)

## 2022-04-02 LAB — COMPREHENSIVE METABOLIC PANEL
BKR A/G RATIO: 0.7 — ABNORMAL LOW (ref 1.0–2.2)
BKR ALANINE AMINOTRANSFERASE (ALT): 15 U/L (ref 10–35)
BKR ALBUMIN: 3.4 g/dL — ABNORMAL LOW (ref 3.6–4.9)
BKR ALKALINE PHOSPHATASE: 83 U/L (ref 9–122)
BKR ANION GAP: 10 (ref 7–17)
BKR ASPARTATE AMINOTRANSFERASE (AST): 15 U/L (ref 10–35)
BKR AST/ALT RATIO: 1
BKR BILIRUBIN TOTAL: 0.2 mg/dL (ref <=1.2)
BKR BLOOD UREA NITROGEN: 11 mg/dL (ref 8–23)
BKR BUN / CREAT RATIO: 35.5 — ABNORMAL HIGH (ref 8.0–23.0)
BKR CALCIUM: 10.2 mg/dL (ref 8.8–10.2)
BKR CHLORIDE: 104 mmol/L (ref 98–107)
BKR CO2: 22 mmol/L (ref 20–30)
BKR CREATININE: 0.31 mg/dL — ABNORMAL LOW (ref 0.40–1.30)
BKR EGFR, CREATININE (CKD-EPI 2021): >60 mL/min/{1.73_m2} (ref >=60)
BKR GLOBULIN: 5 g/dL — ABNORMAL HIGH (ref 2.3–3.5)
BKR GLUCOSE: 102 mg/dL — ABNORMAL HIGH (ref 70–100)
BKR POTASSIUM: 4.2 mmol/L (ref 3.3–5.3)
BKR PROTEIN TOTAL: 8.4 g/dL (ref 6.6–8.7)
BKR SODIUM: 136 mmol/L (ref 136–144)

## 2022-04-02 MED ORDER — GABAPENTIN 100 MG CAPSULE
100 mg | Freq: Two times a day (BID) | ORAL | Status: DC | PRN
Start: 2022-04-02 — End: 2022-06-30
  Administered 2022-04-13 – 2022-06-24 (×18): 100 mg via ORAL

## 2022-04-02 MED ORDER — GABAPENTIN 100 MG CAPSULE
100 mg | Freq: Every evening | ORAL | Status: DC
Start: 2022-04-02 — End: 2022-04-23
  Administered 2022-04-04 – 2022-04-23 (×20): 100 mg via ORAL

## 2022-04-02 NOTE — Plan of Care
Plan of Care Overview/ Patient Status    [name]&O X 1. Denies pain. Vomited X 1 this morning, provider t bedside aware. On bolus food and dysphagia diet. Aspiration precaution observed. Incontinent of urine. Plan per team.

## 2022-04-02 NOTE — Other
-  CONSULT  REQUEST  DOC[facility] NOTE-Type of consult: [facility] Psychiatry -New Consult: [name] [name] Drucie Opitz: 9822/9822-A / Brief Clinical Question: Requesting psychiatry evaluation for capacity for conservatorship paperwork/Callback Cell Phone: [phone] / Please confirm receipt of this message by texting back ?OK?-1 - Mobile Heartbeat message sent to Frazer, D at 2:22 PM. Received response at 14:22.-Jennefer Kopp Justice Deeds, PCT4/12/20232:21 [employer] [phone]

## 2022-04-02 NOTE — Plan of Care
Plan of Care Overview/ Patient Status    Medical Social Work Follow Up  [employer] Most Recent Value Admission Information  Document Type Progress Note Reason for Inability to Assess --  [Patient mental status is altered] (For Inpatient/ED Only) Prior psychosocial assessment has been documented within this hospitalization No (For Inpatient/ED Only) Prior psychosocial assessment has been documented within 30 days of this hospitalization No Reason for Current Social Work Involvement Sports administrator, Medical Team Family/Caregiver Comment Jacquenette Shone Medical Team Comment Medical Team Record Reviewed Yes Level of Care Inpatient Psychosocial issues requiring intervention Conservatorship Psychosocial interventions 15 minutes spent on the phone with Ms. Rembold sister, Jacquenette Shone [phone]). [name] informed that she has completed her part of the conservatorship application. She stated that there are forms for the phsycian's evaluation that need to be completed. I provided support and informed her that Psychiatry would be completing the form and I will send it to the court once it is completed. I encouraged her to submit the completed application to the [employer]. [name] reported that she missed the deadline for the Medicaid application, but she reached out to request an extension. [name] appreciated my asisstance and I thanked her for her time. I provided update to the Medical Provider. Collaborations [employer] Specific referrals to enhance community supports (include existing and new resources) None Handoff Required? No Next Steps/Plan (including hand-off): I will remain involved for support as needed. Signature: Leim Fabry, LCSW Contact Information: [phone]

## 2022-04-02 NOTE — Plan of Care
Plan of Care Overview/ Patient Status    2300-0700 General: Patient denied pain, safety maintained.Cardio: VSS, no c/o chest pain. Neuro: A&O x self & place, calm & cooperative.Vascular: cognitive impaired, unable to assess numbness/tingling.Resp: 97%RA, denied SOB, no s/s distress.GU: incontinent.GI: right ileostomy 2 appliances changed, moderate liquid stool noted. Left abdomen wound pouch with no output.Diet: PEG site redness, skin barrier applied. Jevity 1.5 bolus feed during the day. No n/v.Mobility: Unsafe to ambulate, T&P with assist x2 in bed.Access: RUE PIV patent, saline locked.Skin: see flow sheet.BP 117/82  - Pulse (!) 93  - Temp 98.6 ?F (37 ?C) (Oral)  - Resp 18  - Ht 5' 10 (1.778 m)  - Wt 72.7 kg  - SpO2 97%  - BMI 23.00 kg/m? Problem: Adult Inpatient Plan of CareGoal: Plan of Care ReviewOutcome: Interventions implemented as appropriateGoal: Patient-Specific Goal (Individualized)Outcome: Interventions implemented as appropriateGoal: Absence of Hospital-Acquired Illness or InjuryOutcome: Interventions implemented as appropriateGoal: Optimal Comfort and WellbeingOutcome: Interventions implemented as appropriateGoal: Readiness for Transition of CareOutcome: Interventions implemented as appropriate Problem: InfectionGoal: Absence of Infection Signs and SymptomsOutcome: Interventions implemented as appropriate Problem: Physical Therapy GoalsGoal: Physical Therapy GoalsDescription: PT GOALS - addressed 4/10/231. Patient will demonstrate active range of motion x 4 extremities in preparation for mobility (GOAL MET)2. Caregivers will be independent and safely implement a range of motion/positioning program to prevent loss of range of motion and skin integrity (ONGOING)3. Patient will perform bed mobility with maximal assist (GOAL MET)4. Patient will tolerate sitting edge of bed >5 minutes with minimal assist (ONGOING)5. Patient will perform transfers with moderate assist of 2 using rolling walker (ONGOING)6. Pt will perform bed mobility w/ modAx1 (NEW) Outcome: Interventions implemented as appropriate Problem: Occupational Therapy GoalsGoal: Occupational Therapy GoalsDescription: OT goals1. Pt will move to EOB for seated ADL mod I2. Pt will follow 3/4 simple one step motor commands IND3. Pt will sequence through set up ADL task INDOutcome: Interventions implemented as appropriate Problem: Impaired Wound HealingGoal: Optimal Wound HealingOutcome: Interventions implemented as appropriate Problem: Fall Injury RiskGoal: Absence of Fall and Fall-Related InjuryOutcome: Interventions implemented as appropriate Problem: Violence Risk or ActualGoal: Anger and Impulse ControlOutcome: Interventions implemented as appropriate Problem: Skin Injury Risk IncreasedGoal: Skin Health and IntegrityOutcome: Interventions implemented as appropriate Problem: Mobility ImpairmentGoal: Optimal MobilityOutcome: Interventions implemented as appropriate Problem: Aspiration (Enteral Nutrition)Goal: Absence of Aspiration Signs and SymptomsOutcome: Interventions implemented as appropriate Problem: Device-Related Complication Risk (Enteral Nutrition)Goal: Safe, Effective Therapy DeliveryOutcome: Interventions implemented as appropriate Problem: Confusion AcuteGoal: Optimal Cognitive FunctionOutcome: Interventions implemented as appropriate Problem: Pain AcuteGoal: Acceptable Pain Control and Functional AbilityOutcome: Interventions implemented as appropriate Problem: Speech Language Pathology GoalsGoal: Dysphagia Goals, SLPDescription: Current goal: Patient will tolerate regular solids and thin liquids without adverse impact on respiratory status and while maintaining adequate oral nutrition.Prior goal (met): The patient will tolerate least restrictive diet without any decline in respiratory status.Prior goal (met): Pt will tolerate pureed diet with thin liquids w/o s/x of aspiration or adverse impact on respiratory status.Outcome: Interventions implemented as appropriate Problem: Self-Care DeficitGoal: Improved Ability to Complete Activities of Daily LivingOutcome: Interventions implemented as appropriate Problem: Fluid, Electrolyte and Nutrition Imbalance (Ileostomy)Goal: Fluid, Electrolyte and Nutrition BalanceOutcome: Interventions implemented as appropriate Problem: Infection (Ileostomy)Goal: Absence of Infection Signs and SymptomsOutcome: Interventions implemented as appropriate Problem: Skin or Soft Tissue InfectionGoal: Absence of Infection Signs and SymptomsOutcome: Interventions implemented as appropriate

## 2022-04-02 NOTE — Progress Notes
[facility]	 [facility]	Daily Progress NoteHospital Medicine ServiceAttending Provider: Beckie Busing, MD [phone]:  9822/9822-AHospital Day #50 Subjective: Patient seen and examined on 04/02/2022, 2:39 PM.CC: Fever, unspecified fever causeInterim History: -Much more withdrawn this morning -Had a small amount of emesis after bolus TF - has not eaten her diet Objective: Current Meds:banana flakes-t-galactooligos., 1 packet, BIDElemental iron, 45 mg, BIDenoxaparin (LOVENOX) treatment, 80 mg, Q12Hfamotidine, 20 mg, Dailyfolic acid, 1 mg, Dailygabapentin, 100 mg, TIDniacin, 100 mg, Nightlynystatin, 1 Application, BIDpyridoxine (vitamin B6), 100 mg, Daily[Held by provider] rosuvastatin, 10 mg, Nightlysodium chloride, 3 mL, Q8Hthiamine, 100 mg, BID Vitals:Temp:  [97.4 ?F (36.3 ?C)-99.6 ?F (37.6 ?C)] 99.6 ?F (37.6 ?C)Pulse:  [82-97] 86Resp:  [16-20] 20BP: (109-120)/(75-82) 114/75SpO2:  [96 %-100 %] 96 %Device (Oxygen Therapy): room airRecent Labs   04/10/231014 04/11/230416 04/12/230354 GLU 101* 93 102* I/O's:Intake/Output Summary (Last 24 hours) at 04/02/2022 1206Last data filed at 04/02/2022 0926Gross per 24 hour Intake 1725 ml Output 655 ml Net 1070 ml Physical Exam GENERAL: The patient is well developed and nontoxic.HEART: Regular rate and rhythm without murmurs, rubs, or gallopsLUNGS: Clear to auscultation bilaterally, without wheezing, rhonchi, or cracklesABDOMEN: 2 colostomy bags noted. Soft non tender SKIN: No rash, no excessive bruising, petechiae, or purpura. No Lower extremity edema NEUROLOGIC: Cranial nerves II-XII grossly intact without motor/sensory deficitPSYCH: more withdrawn and not as verbal this AM Diagnostics:I have reviewed new labs and imaging over the last 24 hours with the following notable findings:Cr is 0.31 Assessment: 66 y.o. 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 [place]. 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 Per 3/30 notes, seems imaging was reviewed and that nodules were improving and thought to be resolution of continued debris. LFTs improved as well. DVT - diagnosed incidentally on 01/22/22 [place] imaging - b/l femoral DVTs. She was evaluated by heme for elevated K/L ratio on FLC analysis thought to be inflammatory in nature. Heme consult note 02/20/2022-Currently on Lovenox 80mg  q12h (had been on DOAC in past and seems plan was to go back to DOAC at discharge as long as no more procedures, etc). Encephalopathy - waxing/waning as she is more withdrawn this morning. Calcium is only 10.2. Labs okay okay. -Change back scheduled gabapentin which was helpful to leg pain to PRN and 1 night time dose -Recheck labs tomorrow -Ensure she is back to baseline later in day LE weakness and pain - paraneoplastic and autoimmune panels negative. MRI brain and spine unremarkable. Neurology recommended EMG which patient declined. Neuro signed off 02/26/22. -Continue gabapentin 100mg  a night Right foot cellulitis - resolved. S/p abx and Xray. No additional needs -F/u with Dr. Gazes 3-4 weeks after discharge Hypercalcemia - PTH in mid reference range 28-30, and 2x elevated molecular PTH. This is higher than expected for non-PTH mediated mechanism so primary hyperparathyroidism is still on differential. However, not thought to be the only contributor to her high calcium. 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 RA - stable -No indication for additional immunosuppressive therapy per rheum -HOLD MTX -[place] rheum follow up  once d/c'd -If dry mouth continues, can start cevimeline 30mg  TID Goals of Care and Capacity - during hospitalization in November 2022 with perforated diverticulum, deemed at this time not to have capacity. PEG was ultimately placed and TFs were initiated -At this time, awaiting conservatorship with her sister -Awaiting T19 and LTAC Diet Tube Feed With TrayVTE PPx: Lovenox Full CodeSigned:Keric Zehren Kang-Giaimo MD MPH Attending Hospitalist - Program for Hospital MedicineDepartment of Internal Medicine, Daisetta UniversityReachable via MHB4/12/20232:39 PMChecked back in with patient and she is much closer to baseline. Also, have placed pscyh consult for capacity eval for conservatorship.

## 2022-04-02 NOTE — Plan of Care
Plan of Care Overview/ Patient Status    1100-1900 hrs  Pt A&Ox2, forgetful to time and situation.  VSS, tolerating RA.  Pt resting in bed. Upgraded diet, tolerated well, assist feed.  .   Bolus TF a/o via PEG tube, tolerating well.  Dsg to PEG site CD&I.  Foam dsgs to feet CD&I, heel boots in use, + foot drop.   Pt Assist of 1-2 in bed, incontinent of urine.   Ileostomy patent, appliance CD&I.  L mucous fistula pouched w/o and drainage.  Fall precautions w/ bed alrm in use. T&R q2 hrs and prn. 1900-2330 hrs  Medicated at HS for BLE pain w/ Tylenol.   Dsgs changed to BLE.   Pt refused dinner tray.

## 2022-04-03 LAB — RESPIRATORY VIRUS PCR PANEL  (YH VERIGENE)(LAB ORDER ONLY)
BKR ADENOVIRUS: NEGATIVE
BKR HUMAN METAPNEUMOVIRUS (HMPV): NEGATIVE
BKR INFLUENZA A: NEGATIVE
BKR INFLUENZA B: NEGATIVE
BKR PARAINFLUENZA VIRUS 1: NEGATIVE
BKR PARAINFLUENZA VIRUS 2: NEGATIVE
BKR PARAINFLUENZA VIRUS 3: NEGATIVE
BKR PARAINFLUENZA VIRUS 4: NEGATIVE
BKR RESPIRATORY SYNCYTIAL VIRUS: NEGATIVE
BKR RHINOVIRUS: NEGATIVE

## 2022-04-03 LAB — BASIC METABOLIC PANEL
BKR ANION GAP: 11 (ref 7–17)
BKR BLOOD UREA NITROGEN: 11 mg/dL (ref 8–23)
BKR BUN / CREAT RATIO: 32.4 — ABNORMAL HIGH (ref 8.0–23.0)
BKR CALCIUM: 10.4 mg/dL — ABNORMAL HIGH (ref 8.8–10.2)
BKR CHLORIDE: 101 mmol/L (ref 98–107)
BKR CO2: 23 mmol/L (ref 20–30)
BKR CREATININE: 0.34 mg/dL — ABNORMAL LOW (ref 0.40–1.30)
BKR EGFR, CREATININE (CKD-EPI 2021): >60 mL/min/{1.73_m2} (ref >=60)
BKR GLUCOSE: 97 mg/dL (ref 70–100)
BKR POTASSIUM: 4.4 mmol/L (ref 3.3–5.3)
BKR SODIUM: 135 mmol/L — ABNORMAL LOW (ref 136–144)

## 2022-04-03 LAB — URINALYSIS WITH CULTURE REFLEX      (BH LMW YH)
BKR BILIRUBIN, UA: NEGATIVE
BKR BLOOD, UA: NEGATIVE
BKR GLUCOSE, UA: NEGATIVE
BKR KETONES, UA: NEGATIVE
BKR LEUKOCYTE ESTERASE, UA: NEGATIVE
BKR NITRITE, UA: NEGATIVE
BKR PH, UA: 6 (ref 5.5–7.5)
BKR SPECIFIC GRAVITY, UA: 1.025 (ref 1.005–1.030)
BKR UROBILINOGEN, UA (MG/DL): <2 mg/dL (ref <=2.0)

## 2022-04-03 LAB — CBC WITH AUTO DIFFERENTIAL
BKR WAM ABSOLUTE IMMATURE GRANULOCYTES.: 0.07 x 1000/ÂµL (ref 0.00–0.30)
BKR WAM ABSOLUTE LYMPHOCYTE COUNT.: 0.89 x 1000/ÂµL (ref 0.60–3.70)
BKR WAM ABSOLUTE NRBC (2 DEC): 0 x 1000/ÂµL (ref 0.00–1.00)
BKR WAM ANALYZER ANC: 10.95 x 1000/ÂµL — ABNORMAL HIGH (ref 2.00–7.60)
BKR WAM BASOPHIL ABSOLUTE COUNT.: 0.04 x 1000/ÂµL (ref 0.00–1.00)
BKR WAM BASOPHILS: 0.3 % (ref 0.0–1.4)
BKR WAM EOSINOPHIL ABSOLUTE COUNT.: 0.13 x 1000/ÂµL (ref 0.00–1.00)
BKR WAM EOSINOPHILS: 1 % (ref 0.0–5.0)
BKR WAM HEMATOCRIT (2 DEC): 33.8 % — ABNORMAL LOW (ref 35.00–45.00)
BKR WAM HEMOGLOBIN: 10.5 g/dL — ABNORMAL LOW (ref 11.7–15.5)
BKR WAM IMMATURE GRANULOCYTES: 0.5 % (ref 0.0–1.0)
BKR WAM LYMPHOCYTES: 6.7 % — ABNORMAL LOW (ref 17.0–50.0)
BKR WAM MCH (PG): 26.5 pg — ABNORMAL LOW (ref 27.0–33.0)
BKR WAM MCHC: 31.1 g/dL (ref 31.0–36.0)
BKR WAM MCV: 85.4 fL (ref 80.0–100.0)
BKR WAM MONOCYTE ABSOLUTE COUNT.: 1.24 x 1000/ÂµL — ABNORMAL HIGH (ref 0.00–1.00)
BKR WAM MONOCYTES: 9.3 % (ref 4.0–12.0)
BKR WAM MPV: 11.2 fL (ref 8.0–12.0)
BKR WAM NEUTROPHILS: 82.2 % — ABNORMAL HIGH (ref 39.0–72.0)
BKR WAM NUCLEATED RED BLOOD CELLS: 0 % (ref 0.0–1.0)
BKR WAM PLATELETS: 249 x1000/ÂµL (ref 150–420)
BKR WAM RDW-CV: 16.6 % — ABNORMAL HIGH (ref 11.0–15.0)
BKR WAM RED BLOOD CELL COUNT.: 3.96 M/ÂµL — ABNORMAL LOW (ref 4.00–6.00)
BKR WAM WHITE BLOOD CELL COUNT: 13.3 x1000/ÂµL — ABNORMAL HIGH (ref 4.0–11.0)

## 2022-04-03 LAB — UA REFLEX CULTURE

## 2022-04-03 LAB — LACTIC ACID, PLASMA (REFLEX 2H REPEAT): BKR LACTATE: 2.9 mmol/L — ABNORMAL HIGH (ref 0.5–2.2)

## 2022-04-03 LAB — CALCIUM: BKR CALCIUM: 10.4 mg/dL — ABNORMAL HIGH (ref 8.8–10.2)

## 2022-04-03 LAB — REFLEX LACTIC ACID, PLASMA: BKR LACTATE: 1.3 mmol/L (ref 0.5–2.2)

## 2022-04-03 NOTE — Plan of Care
Danielle [name]					Location: EP 97/9822-A65 y.o., female				Attending: Delene Ruffini*	Admit Date: 02/11/2022			[name] LOS: 51 days FOLLOW-UP NUTRITION ASSESSMENT Dietary Orders (From admission, onward)     Start     Ordered  04/01/22 1414  Diet Tube Feed With Tray  (Diet Tube Feed Panel)  DIET EFFECTIVE NOW      Comments: JEVITY 1.5 @ 1.5 cartons bolus TID @9am ,1pm,5pm with 1 carton at 9pm to total 5.5 cartons daily)Important:  Unrestricted diet will be sent unless otherwise specified below. Question Answer Comment Diet Selection? Dysphagia Step 3 Chopped/Soft  Safety Tray? (Suicide/Homicide/Police Custody) Safety Tray - No  Adult Tube Feed Formulas: Jevity 1.5 (YNH,SRC)  Feeding Route: G-Tube  Initiate Nutrition Management Protocol (Yes/No?) Yes - Initiate Protocol    04/01/22 1414    No Known AllergiesANTHROPOMETRICS:Height:???70Admit wt:?no recent wt to assess.?Current wt:?74.3kg (4/13, bed) BMI:?23.5??(based on?current wt)?Admit wt trends:?04/02/22 0829 74.6 kg Bed 03/31/22 0809 72.7 kg Bed 03/29/22 0825 73.8 kg Bed 03/28/22 0752 73 kg Bed 03/27/22 0739 73.2 kg Bed 03/26/22 0803 71.3 kg Bed 03/25/22 0826 71.5 kg Bed 03/22/22 0804 71 kg Bed 03/20/22 0812 72.6 kg Bed 03/19/22 0824 72.4 kg Bed Dosing Wt:?73?kg (based on?current wt trends and normal BMI)Additional wt information:Ht confirmed with pt. Pt last known wt was 98.6 kg (12/13/2018).?Wt loss?confirmed with admit wt?as pt no longer weighs?98 kg.?Time line for wt loss in not clear and pt cannot report.?1+ trace edema b/l LE per flowsheets. Noted wt loss 3/29-4/1, suspect d/t inadequate oral intake and stopping tube feed. Wt back to adm weight since TF restarted.?Wt:03/20/22    72.6 kg            Bed03/29/23    72.4 kg            Bed12/23/19    98.6 kg?ESTIMATED NUTRITION REQUIREMENTS:Kcal/day:?1825??????????????(25 ?kcal/kg)Protein/day:?73-87 ?????????????(1-1.2gm/kg)Fluids/day:?1825 mL/d?(70mL/kcal) ONLY as medical and fluid status allows and only per team discretion. Needs based on:?Dosing Wt (73?kg), maintenance.??NUTRITION ASSESSMENT: PT EMR reviewed for follow up assessment. Meds and labs reviewed. On 4/11 SLP cleared pt for regular solid and thin liquid. Per RN note bolus TF tolerated well. Pt had 1x emesis on 4/11 and 4/12. Pt received on average 79% goal volume in the past week. Pt had on average ileostomy output in the past week. Met with patient at bedside.Visited pt, alert to self only. Pt receiving non-select meals.Assisted with breakfast setup, patient dislike [place] toast, drunk orange juice and milk only. Inconsistent PO intake per RN notes and 1 meal documented in flowsheets, 50% consumed. Pt likely is meeting needs between TF and PO. However, Pt requires TF to meet the majority nutrition needs. To encourage PO, will adjusted TF to better fit needs.Labs-  Na 135; Cr 0.34; BG Pt is ordered for ferrous sulfate BID, folvite daily, Niacin nightly, pyridoxidine daily, Thiamine BID. Would consider need moving forward while on goal rate feeds. ?Cultural/Religious/Ethnic Needs: Unknown ?Food Allergies: NKFA??NUTRITION DIAGNOSIS:Severe malnutrition r/t depletion aeb moderate muscle and fat depletion.- continues??INTERVENTIONS/RECOMMENDATIONS: Meals and Snacks:- Consider advance diet to regular solid and thin liquid per SLP recs to liberalize diet selection, communicated with covering providerGoal: Patient to meet  >?50-75% of estimated nutrition needs prior to next nutrition assessment- goal will be made po for comfort given support provided by goal volume bolus feeds, continue?Nutrition supplement:- will order magic cup BIDNew Goal: pt to consume 1-2 supplement daily ?Enteral Nutrition:?	Consider JEVITY 1.5?@ 1.5 carton bolus BID (@9am ,1pm) and 1 carton BID (5pm, 9pm) to total 5 cartons daily?	Provides: 1.185L, 1778kcal, 76gm protein, free water?	Additional free  water per team. Add'l 877 mL provides 1 mL/kcal fedD/w covering provider Goal: ?-	Patient will receive >90% of goal volume TF/ TPN prior to next nutrition assessment- not met will continue? ?Discharge Planning and Transfer of Nutrition Care:  Nutrition related discharge needs still being determined at this time, will continue to follow  ?MONITORING / EVALUATION:?Food/Nutrition-Related History:Food and Nutrient Intake?Anthropometric Measurements:Weight ?Biochemical Data, Medical Tests and Procedures:Electrolyte and renal profileGlucose/endocrine profile?Nutrition-Focus Physical FindingsOverall findings?Electronically signed by Alphonsa Gin He, Dietetic Intern, 04/03/2022 Coverage for Sherrilee Gilles, RD, April 13, 2023MHB- [phone] Please note: Nutrition is a consult only service on weekends and holidays.  Please enter a consult in EPIC if assistance is needed on a weekend or holiday or via MHB [phone] to contact the covering RD.

## 2022-04-03 NOTE — Plan of Care
Plan of Care Overview/ Patient Status    1900-0700A&Ox1 to self only. VSS on RA except temp 102.9, prn  Tylenol given with temp decreased to 98.6, provider notified. Blood culture, UA and labs obtained, chest xray done at bedside. No c/o pain or SOB. Tube feeding given as ordered. Ileostomy bag changed. T&R in bed q2h. Bed alarm on, call bell within reach, frequent rounding done, safety maintained.

## 2022-04-03 NOTE — Plan of Care
Plan of Care Overview/ Patient Status   Alert & oriented  to self, norm. VSS, with elevated temp. Pleasant and cooperative.  ORA. Denies pain. PEG tube feeding and medication completed, tolerated. Colostomy appliance intact. Will continue to monitor.

## 2022-04-03 NOTE — Plan of Care
Inpatient Physical Therapy Progress Note IP Adult PT Eval/Treat - 04/03/22 1111    Date of Visit / Treatment  Date of Visit / Treatment 04/03/22   Note Type Progress Note   Progress Report Due 04/30/22   End Time 1111    General Information  Subjective Look at you!   General Observations Pt received asleep in bed; on RA; NAD; B podus boots; ileostomy; RN cleared   Precautions/Limitations Fall Precautions;Bed alarm;Chair alarm   Precautions/Limitations Comment WBAT BLE    Weight Bearing Status  Weight Bearing Status Comments WBAT BLE    Vital Signs and Orthostatic Vital Signs  Vital Signs Vital Signs Stable   Vital Signs Free text RA; NAD    Pain/Comfort  Pain Comment (Pre/Post Treatment Pain) Facial grimace and unquantified pain w/ mobility and touch to BLE; RN aware and repositioned for comfort at end of session    Patient Coping  Observed Emotional State accepting;cooperative   Verbalized Emotional State acceptance    Cognition  Overall Cognitive Status Impaired   Orientation Level Oriented to person   Level of Consciousness alert;confused   Following Commands Follows one step commands with increased time;Follows one step commands with repetition   Personal Safety / Judgment Fall risk;Requires supervision with mobility   Attention Requires redirection    Range of Motion  Range of Motion Examination WFL except   Range of Motion Comments Tight B HM and shoulder ROM    Manual Muscle Testing  Manual Muscle Testing Comments + deconditioning    Skin Assessment  Skin Assessment See Nursing Documentation    Balance  Sitting Balance: Static  POOR      Moderate assist to maintain static position with no Assistive Device   Sitting Balance: Dynamic  POOR-    Unable to move from midline voluntarily, maximal assist to right self   Standing Balance: Static POOR-     Maximal assist to maintain static position with no Assistive Device   Standing Balance: Dynamic  POOR-    Unable to move from midline voluntarily, maximal assist to right self   Balance Assist Device Hand held assist   Balance Skills Training Comment Ax2    Bed Mobility  Supine-to-Sit Independence/Assistance Level Maximum assist;Assist of 2   Supine-to-Sit Assist Device Hand held assist   Sit-to-Supine Independence/Assistance Level Maximum assist;Assist of 2   Sit-to-Supine Assist Device Hand held assist   Limitations decreased ability to use arms for pushing/pulling;decreased ability to use legs for bridging/pushing;impaired ability to control trunk for mobility   Bed Mobility, Impairments pain;strength decreased;ROM decreased   Bed Mobility Comments Less retropulsion/better trunk control seated EOB today    Sit-Stand Transfer Training  Sit-Stand Transfer Comments Deferred D/T unsteadiness and weakness    Handoff Documentation  Handoff Patient in bed;Bed alarm;Patient instructed to call nursing for mobility;Discussed with nursing    Endurance  Endurance Comments Poor    PT- AM-PAC - Basic Mobility Screen- How much help from another person do you currently need.....  Turning from your back to your side while in a a flat bed without using rails? 1 - Total - Requires total assistance or cannot do it at all.   Moving from lying on your back to sitting on the side of a flat bed without using bed rails? 1 - Total - Requires total assistance or cannot do it at all.   Moving to and from a bed to a chair (including a wheelchair)? 1 - Total - Requires total assistance or  cannot do it at all.   Standing up from a chair using your arms(e.g., wheelchair or bedside chair)? 1 - Total - Requires total assistance or cannot do it at all.   To walk in a hospital room? 1 - Total - Requires total assistance or cannot do it at all.   Climbing 3-5 steps with a railing? 1 - Total - Requires total assistance or cannot do it at all. AMPAC Mobility Score 6   TARGET Highest Level of Mobility Mobility Level 2, Turn self in bed/bed activity/dependent transfer    ACTUAL Highest Level of Mobility Mobility Level 3, Sit on edge of bed    Therapeutic Exercise  Therapeutic Exercise Comments AROM x 4; PROM to BLE    Neuromuscular Re-education  Neuromuscular Re-education Comments Seated postural control training    Therapeutic Functional Activity  Therapeutic Functional Activity Comments Bed mobility; Pt edu    Clinical Impression  Follow up Assessment Pt seen for PT f/u. Continues to displayed impaired cogniton and requires maxAx2 for bed mobility. Displayed improved trunk control seated EOB today; less retropulsion and lateral lean. Will benefit from skilled services to improve functional ROM/strength, postural control, and activity tolerance. Current PT d/c rec is STR.   Criteria for Skilled Therapeutic Interventions Met yes    Patient/Family Stated Goals  Patient/Family Stated Goal(s) feel better    Frequency/Equipment Recommendations  PT Frequency 2x per week   What day of week is next treatment expected? Monday   4/17  PT/PTA completing this assessment RL   Equipment Needs During Admission/Treatment No device    PT Recommendations for Inpatient Admission  Activity/Level of Assist mechanical lift;assist of 2    Planned Treatment / Interventions  Plan for Next Visit Progress as tol   Training Treatment / Interventions Balance / Gait Training;Functional Mobility Public librarian;Endurance Training   Education Treatment / Interventions Patient Education / Training    PT Discharge Summary  Physical Therapy Disposition Recommendation Short Term Rehab     Shanon Payor PT, [place]: [phone]

## 2022-04-03 NOTE — Other
-  CONSULT  REQUEST  DOCUMENTATION-[facility] NOTE-Type of consult: [facility] Neurology -New Consult: [name] [name] Drucie Opitz: [id number]/[id number]-A / Brief Clinical Question: 66 year old woman being evaluated by capacity - request to comment on enlarged ventricles on MRI if NPH a possibility and if reversible before psych evaluation can be complete./Callback Cell Phone: [phone] / Please confirm receipt of this message by texting back ?OK?-1 - Mobile Heartbeat message sent to Holli Humbles at 11:48 AM. Received response at 1149.-Azure Budnick Dick4/13/202311:48 [facility] [phone]

## 2022-04-03 NOTE — Plan of Care
Patient has new fever 102.9 today. She has h/o DVT and malignancy but has been afebrile until today. BP is stable Will order:  Blood Cx, LA, UA, RVP, CXRHold off antibiotics for nowPRN tylenol Candiss Norse, MD4/12/20239:06 PM
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
## 2022-04-03 NOTE — Progress Notes
Inpatient Occupational Therapy Progress NoteDefault Flowsheet Data (most recent)   IP Adult OT Eval/Treat - 04/03/22 0901    Date of Visit / Treatment  Date of Visit / Treatment 04/03/22   Note Type Progress Note   Progress Report Due 04/17/22   End Time 0901    General Information  Pertinent History Of Current Problem Per chart: 66 y.o. 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  Was deemed to lack capaciity, however required ethics consult as family difficult to contact for medical decision making, s/p PEG placement at that time.  Course further c/b b/l DVT, started on AC.  Now presented to [facility] for placement as family unable to care for her, found to be febrile/tachycardic   Precautions/Limitations Fall Precautions    Vital Signs and Orthostatic Vital Signs  Vital Signs Vital Signs Stable    Pain/Comfort  Location #1 - PreTreatment Rating (Numbers Scale) 0/10 - no pain   Posttreatment Rating (Numbers Scale) 0/10 - no pain    Patient Coping  Observed Emotional State accepting   Verbalized Emotional State acceptance    Cognition  Orientation Level Oriented to person   Level of Consciousness alert;confused   Following Commands Follows one step commands with increased time   Personal Safety / Judgment Fall risk   Attention Requires cues    Musculoskeletal  LUE Muscle Strength Grading 2-->active movement with gravity eliminated   RUE Muscle Strength Grading 2-->active movement with gravity eliminated   LLE Muscle Strength Grading 2-->active movement with gravity eliminated   RLE Muscle Strength Grading 2-->active movement with gravity eliminated    Coordination  Opposition Impaired bilaterally    Sensory Assessment Sensory Tests Results No sensory impairment noted    Skin Assessment  Skin Assessment See Nursing Documentation    Balance  Sitting Balance: Static  POOR      Moderate assist to maintain static position with no Assistive Device   Sitting Balance: Dynamic  POOR-    Unable to move from midline voluntarily, maximal assist to right self   Standing Balance: Static POOR-     Maximal assist to maintain static position with no Assistive Device     AM-PAC - Daily Activity IP Short Form  Help needed from another person putting on/taking off regular lower body clothing 2 - A Lot   Help needed from another person for bathing (incl. washing, rinsing, drying) 2 - A Lot   Help needed from another person for toileting (incl. using toilet, bedpan, urinal) 2 - A Lot   Help needed from another person putting on/taking off regular upper body clothing 2 - A Lot   Help needed from another person taking care of personal grooming such as brushing teeth 2 - A Lot   Help needed from another person eating meals 2 - A Lot   AM-PAC Daily Activity Raw Score (Total of rows above) 12   CMS Score (based on Raw Score - with G Code) 12 - 66.57% impaired      (G Code - CL)    Bed Mobility  Rolling/Turning Right - Independence/Assistance Level Moderate assist   Rolling/Turning Left - Independence/Assistance Level Moderate assist   Rolling/Turning Assist Device Bed rails   Supine-to-Sit Independence/Assistance Level Maximum assist   Supine-to-Sit Assist Device Hand held assist   Sit-to-Supine Independence/Assistance Level Maximum assist  Sit-to-Supine Assist Device Hand held assist    Endurance  Endurance Comments poor    Neuromuscular Re-education  Neuromuscular Re-education Comments Seated postural control training    Clinical Impression  Follow up Assessment Pyt tolerating EOB activities and first step of mobility progression   Impairments Found (describe specific impairments) BADL's    Patient/Family Stated Goals  Patient/Family Stated Goal(s) feel better    Frequency/Equipment Recommendations  OT Frequency 2x per week   What day of week is next treatment expected? Monday   4/17  OT/OTA completing this assessment GT    OT Discharge Summary  Disposition Recommendation Short Term Rehab

## 2022-04-03 NOTE — Plan of Care
Plan of Care Overview/ Patient Status    Pt Alert/confused on my shift. AX2 with T&R. Cont with bolus tube feed. No complaints on my shift.  On RA. Not on tele. PEG tube in place. Safety and comfort maintained.WCTM.

## 2022-04-03 NOTE — Other
Patient seen for ostomy support. Per the FS her ileostomy bag needed to be changed on 4/13, 4/12, and 4/11. Ileostomy pouch evaluated and it is intact with no leaking noted at this time. Wafer lifting at the lateral aspect on the pouch, however wafer remains intact around stoma. Secured with one barrier strip. Patient mucous fistula pouch clean, dry and intact. Patient incontinent of large amount of urine, peri-care provided. Patient is dependent for all ostomy care. Ileostomy (RLQ) recommendations:-empty pouch when 1/3 full -change pouch every 3 days and as needed for leakage -do not reinforce wafer if leakage-use 1-piece pouch (Infor# [id number])*If skin is irritated from leaking (dermatitis) then crust prior to applying pouch*-cleanse peristomal skin with warm water- Apply ostomy powder to peristomal skin rash - Dust off excess powder - Apply barrier spray over powder to seal - Allow to dry, repeat powder spray application. Mucous fistula (LUQ) recommendations:-empty pouch when 1/3-1/2 full-change pouch every 3 days and as needed for leakage -do not reinforce wafer if leakage-use 1-piece pouch (Infor# [id number])Patient is dependent for all ostomy care.[facility] RN, MSN, Wound/ Ostomy TeamMHB [phone]

## 2022-04-03 NOTE — Progress Notes
[facility] Hospital[facility]	Podiatry Progress NoteDate: 4/13/2023Patient Name: Danielle MaloneMRN: [name] Attending Provider: Pat Kocher Jeey*CC: Bilateral foot woundsSubjective: Pt seen at bedside this a.m. with attending in NAD. NAEO. Denies any pain to her feet. PMH: PSH: No past medical history on file. No past surgical history on file. Social History: Family History: Social History Tobacco Use ? Smoking status: Not on file ? Smokeless tobacco: Not on file Substance Use Topics ? Alcohol use: Not on file  No family history on file. Allergies: Patient has no known allergies.Review of Systems: Unable to assess 2/2 AMSVitals: Vitals:  04/03/22 0824 BP: 120/79 Pulse: 89 Resp: 20 Temp: (!) 100 ?F (37.8 ?C) Physical Exam: Gen: NADHead: NormocephalicPsych: AAO x 1Chest: CTAB, no r/r/wCardio: RRR, S1, S2, no m/r/gAbd: Soft NT, ND, +bsVascular: DP/PT pulses palpable. Normal CRT to all digits. Foot is warm to touch. Pedal hair growth is absentNeuro: Unable to assess MSK: Unable to ROM digits when asked, unable to participate in MMTDerm: Eschar loose, no underlying wound post removal.Labs: Lab Results Component Value Date  WBC 13.3 (H) 04/03/2022  HGB 10.5 (L) 04/03/2022  HCT 33.80 (L) 04/03/2022  CREATININE 0.34 (L) 04/03/2022  BUN 11 04/03/2022  CO2 23 04/03/2022  PLT 249 04/03/2022  INR 1.10 02/18/2022  GLU 97 04/03/2022  K 4.4 04/03/2022  NA 135 (L) 04/03/2022  CL 101 04/03/2022  ALT 15 04/02/2022  AST 15 04/02/2022  TSH 1.670 02/17/2022  HSCRP 25.8 (H) 03/14/2022  SEDRATE 98 (H) 02/19/2022 Imaging: XR Foot Bilateral AP Lateral and ObliqueResult Date: 3/7/2023AP, LATERAL AND OBLIQUE BILATERAL FOOT X-RAY Clinical Information: Baseline assessment. Assess for erosions. Comparison: None Findings: Left foot: The overall bony mineralization appears qualitatively diminished. There is evidence of joint space narrowing indicative of arthropathy involving the interphalangeal joint and metatarsal phalangeal joint of the first digit as well as the DIP and PIP joints of the remaining digits. There is mild hallux valgus as well as bunion formation. The moderate degenerative changes are noted in the midfoot. There is no acute osseous injury. Right foot: The overall bony mineralization appears qualitatively diminished. There is evidence of joint space narrowing indicative of arthropathy involving the interphalangeal joint and metatarsal phalangeal joint of the first digit as well as the DIP and PIP joints of the remaining digits. There is moderate to severe hallux valgus as well as bunion formation. The moderate degenerative changes are noted in the midfoot. There is no acute osseous injury. 1. Diffuse degenerative changes without evidence of acute osseous injury. There is no evidence of bony erosion. Reported And Signed By: Merdis Delay, MD  [facility]iology [facility] Biomedical Imaging Micro/Path: None Assessment/Plan: Danielle [name] is a 66 y.o. female with Right lateral foot and and resolved Left medial eminence deep tissue injuries, stable. No open wounds or SOI. ?- Congratulated patient with achieving wound healing- No bandages needed at bilateral feet, continue use of prevalon boots- Podiatry to sign off; please reconsult for any acute issues prn Dispo: Patient may follow-up with Dr. Franco Nones within 3-4 weeks of discharge.  Podiatry will continue to follow peripherally while in house. Please contact the resident on call with any acute changes, new issues, or questions.Seen with attending Dr. Darral Dash page on-call podiatry resident with questions:?	SEE AMION FOR CALL SCHEDULE Electronically Signed by Audrie Gallus, DPM, April 13, 2023I saw and evaluated Danielle [name] with the resident. Congratulated patient with achieving healing. I agree with the findings and the plan of care as documented in the resident note.Electronically Signed by Donn Pierini, DPM, April 03, 2022

## 2022-04-03 NOTE — Progress Notes
[facility]	 [facility]	Daily Progress NoteHospital Medicine ServiceAttending Provider: Beckie Busing, MD [phone]:  9822/9822-AHospital Day #51 Subjective: Patient seen and examined on 04/03/2022, 2:39 PM.CC: Fever, unspecified fever causeInterim History: -fever last night around 9:00 a.m. to 102.9-this morning she denies any focal symptoms.-no pain with urination, no abdominal pain, no nausea, no further vomiting, no shortness of breath, no coughObjective: Current Meds:banana flakes-t-galactooligos., 1 packet, BIDElemental iron, 45 mg, BIDenoxaparin (LOVENOX) treatment, 80 mg, Q12Hfamotidine, 20 mg, Dailyfolic acid, 1 mg, Dailygabapentin, 100 mg, Nightlyniacin, 100 mg, Nightlynystatin, 1 Application, BIDpyridoxine (vitamin B6), 100 mg, Daily[Held by provider] rosuvastatin, 10 mg, Nightlysodium chloride, 3 mL, Q8Hthiamine, 100 mg, BID Vitals:Temp:  [98 ?F (36.7 ?C)-102.9 ?F (39.4 ?C)] 98.6 ?F (37 ?C)Pulse:  [74-110] 94Resp:  [18-20] 20BP: (106-120)/(65-79) 114/77SpO2:  [95 %-100 %] 96 %Device (Oxygen Therapy): room airRecent Labs   04/11/230416 04/12/230354 04/13/230422 GLU 93 102* 97 I/O's:Intake/Output Summary (Last 24 hours) at 04/03/2022 1533Last data filed at 04/03/2022 1215Gross per 24 hour Intake 1007 ml Output 590 ml Net 417 ml Physical Exam GENERAL: The patient is well developed and nontoxic.HEART: Regular rate and rhythm without murmurs, rubs, or gallopsLUNGS:  Minimal crackles bilaterally to auscultationABDOMEN: 2 colostomy bags noted. Soft non tender SKIN: No rash, no excessive bruising, petechiae, or purpura. No Lower extremity edema NEUROLOGIC: Cranial nerves II-XII grossly intact without motor/sensory deficitPSYCH:  Alert and pleasantDiagnostics:I have reviewed new labs and imaging over the last 24 hours with the following notable findings:Unremarkable BMPLeukocytosis to 13.3Calcium 10. data notes negative UA, negative blood cultures, negative viral panel4/13/23 XR CHEST PA OR AP ?INDICATION: new fever. R/o PNA.?COMPARISON: 03/14/2022.?FINDINGS:?The cardiac silhouette is within normal limits.?The pulmonary interstitial markings are prominent, likely representing mild interstitial edema. No focal airspace consolidation, large pleural effusion or pneumothorax is seen.?IMPRESSION: Mild pulmonary interstitial edema. No focal airspace consolidation.Assessment: 66 y.o. 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 New Fever - unclear source of infection.  Without antibiotics she has not had a repeat fever.  She did have an episode of emesis on April 12th, I wonder if she had an aspiration pneumonitis.  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 [place]. 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 Per 3/30 notes, seems imaging was reviewed and that nodules were improving and thought to be resolution of continued debris. LFTs improved as well. DVT - diagnosed incidentally on 01/22/22 [place] imaging - b/l femoral DVTs. She was evaluated by heme for elevated K/L ratio on FLC analysis thought to be inflammatory in nature. Heme consult note 02/20/2022-Currently on Lovenox 80mg  q12h (had been on DOAC in past and seems plan was to go back to DOAC at discharge as long as  no more procedures, etc). LE weakness and pain - paraneoplastic and autoimmune panels negative. MRI brain and spine unremarkable. Neurology recommended EMG which patient declined. Neuro signed off 02/26/22. -Continue gabapentin 100mg  a night Right foot cellulitis - resolved. S/p abx and Xray. No additional needs -F/u with Dr. Gazes 3-4 weeks after discharge Hypercalcemia - PTH in mid reference range 28-30, and 2x elevated molecular PTH. This is higher than expected for non-PTH mediated mechanism so primary hyperparathyroidism is still on differential. However, not thought to be the only contributor to her high calcium. 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 RA - stable -No indication for additional immunosuppressive therapy per rheum -HOLD MTX -[place] rheum follow up once d/c'd -If dry mouth continues, can start cevimeline 30mg  TID Goals of Care and Capacity - during hospitalization in November 2022 with perforated diverticulum, deemed at this time not to have capacity. PEG was ultimately placed and TFs were initiated -At this time, awaiting conservatorship with her sister -Awaiting T19 and LTAC Diet Tube Feed With TrayVTE PPx: Lovenox Full CodeSigned:Delson Dulworth Kang-Giaimo MD MPH Attending Hospitalist - Program for Hospital MedicineDepartment of Internal Medicine, Clar[facility] UniversityReachable via [facility]

## 2022-04-04 LAB — COMPREHENSIVE METABOLIC PANEL
BKR A/G RATIO: 0.8 — ABNORMAL LOW (ref 1.0–2.2)
BKR ALANINE AMINOTRANSFERASE (ALT): 15 U/L (ref 10–35)
BKR ALBUMIN: 3.3 g/dL — ABNORMAL LOW (ref 3.6–4.9)
BKR ALKALINE PHOSPHATASE: 78 U/L (ref 9–122)
BKR ANION GAP: 7 (ref 7–17)
BKR ASPARTATE AMINOTRANSFERASE (AST): 21 U/L (ref 10–35)
BKR AST/ALT RATIO: 1.4
BKR BILIRUBIN TOTAL: <0.2 mg/dL (ref <=1.2)
BKR BLOOD UREA NITROGEN: 11 mg/dL (ref 8–23)
BKR BUN / CREAT RATIO: 34.4 — ABNORMAL HIGH (ref 8.0–23.0)
BKR CALCIUM: 10.1 mg/dL (ref 8.8–10.2)
BKR CHLORIDE: 105 mmol/L (ref 98–107)
BKR CO2: 25 mmol/L (ref 20–30)
BKR CREATININE: 0.32 mg/dL — ABNORMAL LOW (ref 0.40–1.30)
BKR EGFR, CREATININE (CKD-EPI 2021): >60 mL/min/{1.73_m2} (ref >=60)
BKR GLOBULIN: 4.3 g/dL — ABNORMAL HIGH (ref 2.3–3.5)
BKR GLUCOSE: 93 mg/dL (ref 70–100)
BKR POTASSIUM: 4.4 mmol/L (ref 3.3–5.3)
BKR PROTEIN TOTAL: 7.6 g/dL (ref 6.6–8.7)
BKR SODIUM: 137 mmol/L (ref 136–144)

## 2022-04-04 LAB — CBC WITH AUTO DIFFERENTIAL
BKR WAM ABSOLUTE IMMATURE GRANULOCYTES.: 0.02 x 1000/ÂµL (ref 0.00–0.30)
BKR WAM ABSOLUTE LYMPHOCYTE COUNT.: 1.51 x 1000/ÂµL (ref 0.60–3.70)
BKR WAM ABSOLUTE NRBC (2 DEC): 0 x 1000/ÂµL (ref 0.00–1.00)
BKR WAM ANALYZER ANC: 3.16 x 1000/ÂµL (ref 2.00–7.60)
BKR WAM BASOPHIL ABSOLUTE COUNT.: 0.06 x 1000/ÂµL (ref 0.00–1.00)
BKR WAM BASOPHILS: 0.9 % (ref 0.0–1.4)
BKR WAM EOSINOPHIL ABSOLUTE COUNT.: 0.45 x 1000/ÂµL (ref 0.00–1.00)
BKR WAM EOSINOPHILS: 7.1 % — ABNORMAL HIGH (ref 0.0–5.0)
BKR WAM HEMATOCRIT (2 DEC): 32.2 % — ABNORMAL LOW (ref 35.00–45.00)
BKR WAM HEMOGLOBIN: 9.9 g/dL — ABNORMAL LOW (ref 11.7–15.5)
BKR WAM IMMATURE GRANULOCYTES: 0.3 % (ref 0.0–1.0)
BKR WAM LYMPHOCYTES: 23.7 % (ref 17.0–50.0)
BKR WAM MCH (PG): 26.3 pg — ABNORMAL LOW (ref 27.0–33.0)
BKR WAM MCHC: 30.7 g/dL — ABNORMAL LOW (ref 31.0–36.0)
BKR WAM MCV: 85.6 fL (ref 80.0–100.0)
BKR WAM MONOCYTE ABSOLUTE COUNT.: 1.18 x 1000/ÂµL — ABNORMAL HIGH (ref 0.00–1.00)
BKR WAM MONOCYTES: 18.5 % — ABNORMAL HIGH (ref 4.0–12.0)
BKR WAM MPV: 11.2 fL (ref 8.0–12.0)
BKR WAM NEUTROPHILS: 49.5 % (ref 39.0–72.0)
BKR WAM NUCLEATED RED BLOOD CELLS: 0 % (ref 0.0–1.0)
BKR WAM PLATELETS: 241 x1000/ÂµL (ref 150–420)
BKR WAM RDW-CV: 16.5 % — ABNORMAL HIGH (ref 11.0–15.0)
BKR WAM RED BLOOD CELL COUNT.: 3.76 M/ÂµL — ABNORMAL LOW (ref 4.00–6.00)
BKR WAM WHITE BLOOD CELL COUNT: 6.4 x1000/ÂµL (ref 4.0–11.0)

## 2022-04-04 NOTE — Plan of Care
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 I informed Danielle [name] that the Physician's Evaluation (PC-370) has been completed and that I would be sending it to the [employer]. Danielle [name] reported that she will now submit the application for the Conservatorship of Danielle [name]. Danielle [name] appreciated my assistance and I thanked her for her time. I then contacted the [employer] [phone]) and confirmed that the PC-370 would be faxed and the original sent in the mail. I then faxed the PC-370 to [phone] and mailed the form VH:[employer] [address] [place], [place] [address]. Collaborations [place], [place] Probate Contractor Specific referrals to enhance community supports (include existing and new resources) None Handoff Required? No Next Steps/Plan (including hand-off): I will remain involved for support as needed. Signature: Danielle Fabry, LCSW Contact Information: [phone]

## 2022-04-04 NOTE — Progress Notes
Danielle [name]	 Danielle [name]	Daily Progress NoteHospital Medicine ServiceAttending Provider: Beckie Busing, MD [phone]:  9822/9822-AHospital Day #52 Subjective: Patient seen and examined on 04/04/2022, 2:39 PM.CC: Fever, unspecified fever causeInterim History: -No further true fevers-Her niece and grand niece are visiting today and she is in good spirits -When I ask specifically about legs - she does still have some ankle numbnessObjective: Current Meds:banana flakes-t-galactooligos., 1 packet, BIDElemental iron, 45 mg, BIDenoxaparin (LOVENOX) treatment, 80 mg, Q12Hfamotidine, 20 mg, Dailyfolic acid, 1 mg, Dailygabapentin, 100 mg, Nightlyniacin, 100 mg, Nightlynystatin, 1 Application, BIDpyridoxine (vitamin B6), 100 mg, Daily[Held by provider] rosuvastatin, 10 mg, Nightlysodium chloride, 3 mL, Q8Hthiamine, 100 mg, BID Vitals:Temp:  [97.9 ?F (36.6 ?C)-99.4 ?F (37.4 ?C)] 98 ?F (36.7 ?C)Pulse:  [74-94] 78Resp:  [16-20] 18BP: (96-128)/(54-80) 96/65SpO2:  [94 %-99 %] 98 %Device (Oxygen Therapy): room airRecent Labs   04/12/230354 04/13/230422 04/14/230453 GLU 102* 97 93 I/O's:Intake/Output Summary (Last 24 hours) at 04/04/2022 1326Last data filed at 04/04/2022 1000Gross per 24 hour Intake 1377 ml Output 350 ml Net 1027 ml Physical Exam GENERAL: The patient is well developed and nontoxic.HEART: Regular rate and rhythm without murmurs, rubs, or gallopsABDOMEN: 2 colostomy bags noted. Soft non tender SKIN: No rash, no excessive bruising, petechiae, or purpura. No Lower extremity edema NEUROLOGIC: Cranial nerves II-XII grossly intact without motor/sensory deficitPSYCH:  Alert and pleasantDiagnostics:I have reviewed new labs and imaging over the last 24 hours with the following notable findings:Micro data notes negative UA, negative blood cultures, negative viral panel4/13/23 XR CHEST PA OR AP ?INDICATION: new fever. R/o PNA.?COMPARISON: 03/14/2022.?FINDINGS:?The cardiac silhouette is within normal limits.?The pulmonary interstitial markings are prominent, likely representing mild interstitial edema. No focal airspace consolidation, large pleural effusion or pneumothorax is seen.?IMPRESSION: Mild pulmonary interstitial edema. No focal airspace consolidation.Assessment: 66 y.o. 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 Fever - last fever 4/12 9PM - unclear source of infection.  Without antibiotics she has not had a repeat fever.  She did have an episode of emesis on April 12th, I wonder if she had an aspiration pneumonitis.  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 They left a note on 04/03/22. Psych also left a note on 04/03/22. PC 370 was completed and provided to SW. 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 [place]. 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 Per 3/30 notes, seems imaging was reviewed and that nodules were improving and thought to be resolution of continued debris. LFTs improved as well. DVT - diagnosed incidentally on 01/22/22 [place] imaging - b/l femoral DVTs. She was evaluated by heme for elevated K/L ratio on FLC analysis thought to be inflammatory in nature. Heme consult note 02/20/2022-Currently on Lovenox 80mg  q12h (had been on DOAC in past and seems plan was to go  back to DOAC at discharge as long as no more procedures, etc). LE weakness and pain - paraneoplastic and autoimmune panels negative. MRI brain and spine unremarkable. Neurology recommended EMG which patient declined. Neuro signed off 02/26/22. -Continue gabapentin 100mg  a night Right foot cellulitis - resolved. S/p abx and Xray. No additional needs -F/u with Dr. Gazes 3-4 weeks after discharge Hypercalcemia - PTH in mid reference range 28-30, and 2x elevated molecular PTH. This is higher than expected for non-PTH mediated mechanism so primary hyperparathyroidism is still on differential. However, not thought to be the only contributor to her high calcium. 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 RA - stable -No indication for additional immunosuppressive therapy per rheum -HOLD MTX -[place] rheum follow up once d/c'd -If dry mouth continues, can start cevimeline 30mg  TID Goals of Care and Capacity - during hospitalization in November 2022 with perforated diverticulum, deemed at this time not to have capacity. PEG was ultimately placed and TFs were initiated. She is also cleared for non modified regular diet. -At this time, awaiting conservatorship with her sister -Awaiting T57 and LTAC Diet Tube Feed With TrayVTE PPx: Lovenox Full CodeSigned:[name] MD MPH Attending Hospitalist - Program for Hospital MedicineDepartment of Internal Medicine, [place] UniversityReachable via [facility]
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
## 2022-04-04 NOTE — Other
[place] [facility]  HealthNEUROLOGY Initial Consult NoteAttending Provider: Pat Kocher [facility] day: 51Reason for consultation: possible NPH mentioned on radiology reportHPI: Danielle [name] is a 66 y.o. female with PMH notable for RA on MTX and recent prolonged hospitalization (11/17/21-02/05/22) for diverticular perforation OSH in [place] who is now admitted after being brought in by family for LTAC placement.Patient was initially admitted to [facility] hospital 10/2021 iso bowel perf requiring ex-lap. In the acute post-op period, patient was noted to be confused, and level of function/mental status has unfortunately continued to decline. She had extensive workup for this while admitted in [place] includin g EEG and multiple brain MRIs all unrevealing. She was placed in SNF on discharge and has since been brought to [place] by her sister. Neurology was consulted on 2/26 for ongoing encephalopathy, at which time LP was recommended to complete the work-up and extensive CSF testing was unremarkable and opening pressure was 8. 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 Neurology has been re-consulted to evaluate for NPH as a reversible cause of dementia to optimally inform application for conservatorship.Medical History: No past medical history on file.No past surgical history on file.Home Medications/Allergies No current facility-administered medications on file prior to encounter. Current Outpatient Medications on File Prior to Encounter Medication Sig ? acetaminophen (TYLENOL) 160 mg/5 mL elixir 500 mg by Per G Tube route every 6 (six) hours as needed. ? acetaminophen (TYLENOL) 500 mg tablet Give 2 tablets via PEG-tube every 6 hours as needed ? apixaban (ELIQUIS) 5 mg tablet 1 tablet (5 mg total) by Per G Tube route every 12 (twelve) hours. ? chlorhexidine gluconate (PERIDEX) 0.12 % solution Use as directed 15 mLs in the mouth or throat 2 (two) times daily. ? Elemental iron 60 mg (300 mg Ferrous sulfate) / 5 mL oral liquid Give 3.7 mL by g-tube two times daily ? folic acid (FOLVITE) 1 mg tablet 1 tablet (1 mg total) by Per G Tube route daily. ? insulin lispro (ADMELOG, HUMALOG) 100 unit/mL vial Inject under the skin 3 (three) times daily before meals. Use per sliding scale. ? niacin 50 mg Immediate Release tablet 2 tablets (100 mg total) by Per G Tube route at bedtime. ? pantoprazole (PROTONIX) 40 mg GrPS oral suspension 1 packet (40 mg total) by Per G Tube route daily. ? psyllium (METAMUCIL) packet Give 1 packet via g-tube two times daily ? pyridoxine, vitamin B6, (VITAMIN B-6) 100 mg tablet 1 tablet (100 mg total) by Per G Tube route daily. ? rosuvastatin (CRESTOR) 10 mg tablet 1 tablet (10 mg total) by Per G Tube route nightly. ? therapeutic multivitamin liquid 15 mLs by Per G Tube route daily. ? thiamine (VITAMIN B1) 100 mg tablet 1 tablet (100 mg total) by Per G Tube route 2 (two) times daily. Allergies as of 02/11/2022 ? (No Known Allergies) Review of systems  A 14-point ROS was performed and is negative aside from that mentioned in the HPI.Family History No family history on file.Social History:  Medications/Infusions: Scheduled: Current Facility-Administered Medications Medication Dose Route Frequency Provider Last Rate Last Admin ? Banatrol Plus oral powder packet  1 packet Per G Tube BID Charlett Blake, MD   1 packet at 04/03/22 1000 ?  Elemental iron 60 mg (300 mg Ferrous sulfate) / 5 mL oral liquid 45 mg  45 mg Per G Tube BID Peter Congo, Adil, MD   45 mg at 04/03/22 1001 ? enoxaparin (LOVENOX) injection 80 mg  80 mg Subcutaneous Q12H Chaar, Abdelkader, MD   80 mg at 04/03/22 1001 ? famotidine (PEPCID) suspension (8 mg/mL) 20 mg  20 mg Per G Tube Daily Bhutta, Adil, MD   20 mg at 04/03/22 1000 ? folic acid (FOLVITE) tablet 1 mg  1 mg Per G Tube Daily Bhutta, Adil, MD   1 mg at 04/03/22 1001 ? gabapentin (NEURONTIN) capsule 100 mg  100 mg Oral Nightly Kang-Giaimo, Devona Konig, MD     ? niacin Immediate Release tablet 100 mg  100 mg Per G Tube Nightly Bhutta, Adil, MD   100 mg at 04/02/22 2121 ? nystatin (MYCOSTATIN) powder 1 Application  1 Application Topical (Top) BID Henriette Combs, MD   1 Application at 04/03/22 1003 ? pyridoxine (vitamin B6) (B-6) tablet 100 mg  100 mg Per G Tube Daily Bhutta, Adil, MD   100 mg at 04/03/22 1001 ? [Held by provider] rosuvastatin (CRESTOR) tablet 10 mg  10 mg Per G Tube Nightly Dorisann Frames, MD   10 mg at 02/14/22 2245 ? sodium chloride 0.9 % flush 3 mL  3 mL IV Push Q8H Dorisann Frames, MD   3 mL at 04/03/22 1310 ? thiamine (VITAMIN B1) tablet 100 mg  100 mg Per G Tube BID Peter Congo, Adil, MD   100 mg at 04/03/22 1001 Infusions: PRN: acetaminophen, dextrose (GLUCOSE) oral liquid 15 g **OR** fruit juice **OR** skim milk, dextrose (GLUCOSE) oral liquid 30 g **OR** fruit juice, dextrose injection, dextrose injection, gabapentin, glucagon, iohexol **AND** [COMPLETED] XR Abdomen AP, ondansetron **OR** ondansetron (ZOFRAN) IV Push, sodium chloridePhysical Exam & Data: Vitals:Temp:  [98.2 ?F (36.8 ?C)-102.9 ?F (39.4 ?C)] 98.6 ?F (37 ?C)Pulse:  [74-110] 94Resp:  [18-20] 20BP: (106-120)/(67-79) 114/77NIBP MAP (mmHg) (calculated/READ ONLY):  [80-93] 89SpO2:  [95 %-100 %] 96 % (04/13 1514)Weight: 74.3 kgI/O's: I/O last 3 completed shifts:In: 1007 [NG/GT:1007]Out: 590 [Stool:590]Gross Totals (Last 24 hours) at 04/03/2022 1700Last data filed at 04/03/2022 1215Intake 1007 ml Output 590 ml Net 417 ml Physical Exam:Note will be addended with neurological examination once patient is seen and examined by full consult team tomorrow morning.Studies: Labs: Recent Labs Lab 04/08/230525 04/10/231014 04/11/230416 04/12/230354 04/13/230422 WBC 5.9 5.8 5.9 5.8 13.3* HGB 10.6* 10.9* 10.3* 10.8* 10.5* HCT 34.80* 35.10 32.10* 34.70* 33.80* PLT 331 305 279 283 249  Recent Labs Lab 04/10/231014 04/11/230416 04/12/230354 04/13/230422 NEUTROPHILS 51.1 43.5 52.1 82.2*  Recent Labs Lab 04/10/231014 04/11/230416 04/12/230354 04/13/230422 NA 137 137 136 135* K 4.3 3.8 4.2 4.4 CL 105 105 104 101 CO2 22 23 22 23  BUN 12 10 11 11  CREATININE 0.32* 0.31* 0.31* 0.34* ANIONGAP 10 9 10 11   Recent Labs Lab 04/10/231014 04/11/230416 04/12/230354 04/13/230422 CALCIUM 10.0 9.8 10.2 10.4* - 10.4* MG 1.9  --   --   --  PHOS 2.8  --   --   --   Recent Labs Lab 04/11/230416 04/12/230354 ALT 12 15 AST 18 15 ALKPHOS 72 83 BILITOT 0.2 0.2  No results for input(s): TROPONINT, CKTOTAL, CKMB in the last 168 hours. No results for input(s): PTT, LABPROT, INR in the last 168 hours. No results for input(s): PHART, PCO2ART, BEART, HCO3ART in the last 72 hours.Invalid input(s):  O2SATART  FINGERSTICKS: Recent Labs Lab 04/08/230525 04/10/231014 04/11/230416 04/12/230354 04/13/230422 GLU  95 101* 93 102* 97 Imaging: MRI brain w/wo 3/5 1.         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         Redemonstration microvascular disease no new new territorial restricted diffusion hemorrhage or midline shift.Assessment & Recommendations: Honora Searson is a 66 y.o. female with PMH notable for RA on MTX and recent prolonged hospitalization (11/17/21-02/05/22) for diverticular perforation OSH in [place] who is now admitted after being brought in by family for LTAC placement. Neurology is consulted to evaluate for NPH iso potentially concerning radiographic findings. NPH is a clinical diagnosis that may be suspected in the presence of three cardinal symptoms: gait dysfunction (specifically magnetic gait), urinary incontinence, and cognitive decline. Of these, magnetic gait is most specific as the other symptoms are common in the elderly and can have many causes. Unfortunately, it is difficult to assess Ms. Falls's gait as she has been unable to walk due to weakness. Radiographic findings can help support a suspected diagnosis of NPH, however ventricular enlargement and sulcal atrophy is an expected finding in elderly with cognitive impairment or dementia of any etiology and different patterns of atrophy (ie sulcal crowding at the vertex) are often not clinically relevant. Further, this pattern was also noted on prior brain imaging in 2019 long before the development of her current cognitive symptoms.Overall, suspicion for NPH is low, however cannot be definitively ruled out without work-up that involves multiple large volume taps and repeated gait assessments and neurocognitive assessments occurring over the course of about a week. If improvement is seen, it will typically manifest with improvements in gait. Cognition can (slowly) improve to a degree though would not expect a return to previous level of functioning. Ms. Forrey symptoms are most likely the result of a neurodegenerative process that has been potentially exacerbated by prolonged delirium iso being hospitalized for most of the last four months. It is also possible that her presentation appeared relatively acute as being in a familiar place (ie still living in childhood home) as this patient was can mask signs of dementia quite well until pt is in an unfamiliar environment then can appear to deteriorate rapidly. Would also consider sequela of vitamin deficiency such as Wernicke-Korsakoff syndrome. It does not appear thiamine levels were obtained prior to supplementation at OSH, however labs did reveal multiple nutritional deficiencies including vit D, B6, folate, and iron. In later stages, cognitive deficits are irreversible even with supplementation.Recommendations:- would encourage any conservatorship assessment or application to not be delayed pending NPH r/o as it will be a long process for both diagnostics and potential cognitive improvement as described above- consult neurosurgery for NPH work-up as it is generally managed by their service - continue full delirium precautions Thank you for allowing [place] to assist in the care of this patient. Please contact our service for further information or inquiries. Case discussed with attending Dr. Chyrel Masson -- final recommendations to follow.Signed:Glenyce Randle, MDPGY-2 NeurologyAvailable on MHB04/13/23

## 2022-04-04 NOTE — Plan of Care
Plan of Care Overview/ Patient Status    1900-0700A&Ox2 to self and place. VSS on RA. No c/o pain or SOB. Tube feeding given as ordered via LUQ  PEG tube, tolerated well. R Ileostomy with moderate amount of liquid stool, leaking, bag changed. Incontinent of bladder, incontinence care provided. T&R in bed q2h, blue offloading  boots in place. Bed alarm on, call bell within reach, frequent rounding done, safety maintained.

## 2022-04-04 NOTE — Other
Ostomy visit with patient as pouch noted to continue to leak. Cause is unknown. 1 piece appliance was intact, no signs of leaking, however, changed to evaluate stoma and peristomal skin better.Stoma protrudes well, w/ peristalsis stoma becomes more flush, but still remains protruding well enough to not have stool undermine wafer. Skin is only mildy moist at 7-8 o'clock. New pouching system applied:Ileostomy Recommendations:Empty when 1/3 full - may be leaking if too fullChange pouch every 3 days and PRN:-Cleanse skin w/ warm water - dry-Apply ostomy powder to moist skin, wipe off excess powder-Apply barrier spray over powder, allow to dry-Cut wafer of appliance to fit over stoma (77mmx35mm)-Apply barrier ring to wafer then apply to patient-Use fingers/hands to seal wafer, start from right around the stoma and work towards edges-Apply barrier strips to outer edge of waferProduct #s (check supply room prior to ordering):Ostomy powder [id number] Barrier sprayBarrier ring [id number] Barrier strips [id number] piece pouch [id number] pouch continues to leak, please continue with same steps as above, however, use convex pouch #[id number] and apply belt #[id number] Hurshel Party, RN, CWONMHB#: [phone]/14/2023 10:24 AM

## 2022-04-04 NOTE — Plan of Care
Plan of Care Overview/ Patient Status    Pt Alert/confused. Uneventful on my shift. Medicated per MAR. Cont with bolus tube feed. Bed bath done on my shift. A+2 with T&R. Incont of urine. Ostomy in place x2.  Safety and comfort maintained. WCTM.

## 2022-04-05 ENCOUNTER — Inpatient Hospital Stay: Admit: 2022-04-05 | Payer: PRIVATE HEALTH INSURANCE

## 2022-04-05 DIAGNOSIS — R627 Adult failure to thrive: Secondary | ICD-10-CM

## 2022-04-05 LAB — COMPREHENSIVE METABOLIC PANEL
BKR A/G RATIO: 0.7 — ABNORMAL LOW (ref 1.0–2.2)
BKR ALANINE AMINOTRANSFERASE (ALT): 17 U/L (ref 10–35)
BKR ALBUMIN: 3.1 g/dL — ABNORMAL LOW (ref 3.6–4.9)
BKR ALKALINE PHOSPHATASE: 80 U/L (ref 9–122)
BKR ANION GAP: 13 (ref 7–17)
BKR ASPARTATE AMINOTRANSFERASE (AST): 20 U/L (ref 10–35)
BKR AST/ALT RATIO: 1.2
BKR BILIRUBIN TOTAL: <0.2 mg/dL (ref <=1.2)
BKR BLOOD UREA NITROGEN: 11 mg/dL (ref 8–23)
BKR BUN / CREAT RATIO: 39.3 — ABNORMAL HIGH (ref 8.0–23.0)
BKR CALCIUM: 10 mg/dL (ref 8.8–10.2)
BKR CHLORIDE: 101 mmol/L (ref 98–107)
BKR CO2: 20 mmol/L (ref 20–30)
BKR CREATININE: 0.28 mg/dL — ABNORMAL LOW (ref 0.40–1.30)
BKR EGFR, CREATININE (CKD-EPI 2021): >60 mL/min/{1.73_m2} (ref >=60)
BKR GLOBULIN: 4.5 g/dL — ABNORMAL HIGH (ref 2.3–3.5)
BKR GLUCOSE: 183 mg/dL — ABNORMAL HIGH (ref 70–100)
BKR POTASSIUM: 3.6 mmol/L (ref 3.3–5.3)
BKR PROTEIN TOTAL: 7.6 g/dL (ref 6.6–8.7)
BKR SODIUM: 134 mmol/L — ABNORMAL LOW (ref 136–144)

## 2022-04-05 LAB — CBC WITH AUTO DIFFERENTIAL
BKR WAM ABSOLUTE IMMATURE GRANULOCYTES.: 0.01 x 1000/ÂµL (ref 0.00–0.30)
BKR WAM ABSOLUTE LYMPHOCYTE COUNT.: 0.92 x 1000/ÂµL (ref 0.60–3.70)
BKR WAM ABSOLUTE NRBC (2 DEC): 0 x 1000/ÂµL (ref 0.00–1.00)
BKR WAM ANALYZER ANC: 5.01 x 1000/ÂµL (ref 2.00–7.60)
BKR WAM BASOPHIL ABSOLUTE COUNT.: 0.03 x 1000/ÂµL (ref 0.00–1.00)
BKR WAM BASOPHILS: 0.4 % (ref 0.0–1.4)
BKR WAM EOSINOPHIL ABSOLUTE COUNT.: 0.09 x 1000/ÂµL (ref 0.00–1.00)
BKR WAM EOSINOPHILS: 1.3 % (ref 0.0–5.0)
BKR WAM HEMATOCRIT (2 DEC): 32.8 % — ABNORMAL LOW (ref 35.00–45.00)
BKR WAM HEMOGLOBIN: 10.2 g/dL — ABNORMAL LOW (ref 11.7–15.5)
BKR WAM IMMATURE GRANULOCYTES: 0.1 % (ref 0.0–1.0)
BKR WAM LYMPHOCYTES: 13.7 % — ABNORMAL LOW (ref 17.0–50.0)
BKR WAM MCH (PG): 26.7 pg — ABNORMAL LOW (ref 27.0–33.0)
BKR WAM MCHC: 31.1 g/dL (ref 31.0–36.0)
BKR WAM MCV: 85.9 fL (ref 80.0–100.0)
BKR WAM MONOCYTE ABSOLUTE COUNT.: 0.66 x 1000/ÂµL (ref 0.00–1.00)
BKR WAM MONOCYTES: 9.8 % (ref 4.0–12.0)
BKR WAM MPV: 11.3 fL (ref 8.0–12.0)
BKR WAM NEUTROPHILS: 74.7 % — ABNORMAL HIGH (ref 39.0–72.0)
BKR WAM NUCLEATED RED BLOOD CELLS: 0 % (ref 0.0–1.0)
BKR WAM PLATELETS: 247 x1000/ÂµL (ref 150–420)
BKR WAM RDW-CV: 16.4 % — ABNORMAL HIGH (ref 11.0–15.0)
BKR WAM RED BLOOD CELL COUNT.: 3.82 M/ÂµL — ABNORMAL LOW (ref 4.00–6.00)
BKR WAM WHITE BLOOD CELL COUNT: 6.7 x1000/ÂµL (ref 4.0–11.0)

## 2022-04-05 LAB — PROCALCITONIN     (BH GH LMW Q YH): BKR PROCALCITONIN: 5.95 ng/mL — ABNORMAL HIGH

## 2022-04-05 MED ORDER — SODIUM CHLORIDE 0.9 % LARGE VOLUME SYRINGE FOR AUTOINJECTOR
Freq: Once | INTRAVENOUS | Status: CP | PRN
Start: 2022-04-05 — End: ?
  Administered 2022-04-05: 15:00:00 via INTRAVENOUS

## 2022-04-05 MED ORDER — IOHEXOL 350 MG IODINE/ML INTRAVENOUS SOLUTION
350 mg iodine/mL | Freq: Once | INTRAVENOUS | Status: CP | PRN
Start: 2022-04-05 — End: ?
  Administered 2022-04-05: 15:00:00 350 mL via INTRAVENOUS

## 2022-04-05 NOTE — Plan of Care
Plan of Care Overview/ Patient Status    Pt Alert to self/ confused. On RA, not on tele, medicated per MAR. Cont on bolus tube feeding Q4HR. PEG tube in place.  AX2 T&R. Purewick in place. Refused food today  I don't want anything to eat today. Afebrile on my shift. Safety and comfort maintained. WCTM. 1810 pt felt warm to the touch during pt care. PT  had temp of 101.8, tylenol given. MD Urban Gibson notified.  PEG tube dsg change done on my shift.

## 2022-04-05 NOTE — Plan of Care
Plan of Care Overview/ Patient Status    1900-0700A&Ox1 to self only,  pt alert but confused.  VSS on RA. No c/o pain or SOB. Tube feeding given as ordered via LUQ  PEG tube, tolerated well. RLQ  Ileostomy with green-brown liquid stool. LLQ pouch with no output. Incontinent of bladder, incontinence care provided. T&R in bed q2h, blue offloading  boots in place. Bed alarm on, call bell within reach, frequent rounding done, safety maintained0155Temp 101.9, HR in 120's, otherwise VSS. Tylenol given with temp decreased to 97.7. Provider((Yboah, Ben) notified.

## 2022-04-06 LAB — CBC WITH AUTO DIFFERENTIAL
BKR WAM ABSOLUTE IMMATURE GRANULOCYTES.: 0.02 x 1000/ÂµL (ref 0.00–0.30)
BKR WAM ABSOLUTE LYMPHOCYTE COUNT.: 1.57 x 1000/ÂµL (ref 0.60–3.70)
BKR WAM ABSOLUTE NRBC (2 DEC): 0 x 1000/ÂµL (ref 0.00–1.00)
BKR WAM ANALYZER ANC: 2.98 x 1000/ÂµL (ref 2.00–7.60)
BKR WAM BASOPHIL ABSOLUTE COUNT.: 0.04 x 1000/ÂµL (ref 0.00–1.00)
BKR WAM BASOPHILS: 0.7 % (ref 0.0–1.4)
BKR WAM EOSINOPHIL ABSOLUTE COUNT.: 0.24 x 1000/ÂµL (ref 0.00–1.00)
BKR WAM EOSINOPHILS: 4 % (ref 0.0–5.0)
BKR WAM HEMATOCRIT (2 DEC): 32.7 % — ABNORMAL LOW (ref 35.00–45.00)
BKR WAM HEMOGLOBIN: 10.3 g/dL — ABNORMAL LOW (ref 11.7–15.5)
BKR WAM IMMATURE GRANULOCYTES: 0.3 % (ref 0.0–1.0)
BKR WAM LYMPHOCYTES: 26.3 % (ref 17.0–50.0)
BKR WAM MCH (PG): 27.2 pg (ref 27.0–33.0)
BKR WAM MCHC: 31.5 g/dL (ref 31.0–36.0)
BKR WAM MCV: 86.3 fL (ref 80.0–100.0)
BKR WAM MONOCYTE ABSOLUTE COUNT.: 1.13 x 1000/ÂµL — ABNORMAL HIGH (ref 0.00–1.00)
BKR WAM MONOCYTES: 18.9 % — ABNORMAL HIGH (ref 4.0–12.0)
BKR WAM MPV: 10.9 fL (ref 8.0–12.0)
BKR WAM NEUTROPHILS: 49.8 % (ref 39.0–72.0)
BKR WAM NUCLEATED RED BLOOD CELLS: 0 % (ref 0.0–1.0)
BKR WAM PLATELETS: 265 x1000/ÂµL (ref 150–420)
BKR WAM RDW-CV: 16.4 % — ABNORMAL HIGH (ref 11.0–15.0)
BKR WAM RED BLOOD CELL COUNT.: 3.79 M/ÂµL — ABNORMAL LOW (ref 4.00–6.00)
BKR WAM WHITE BLOOD CELL COUNT: 6 x1000/ÂµL (ref 4.0–11.0)

## 2022-04-06 LAB — BASIC METABOLIC PANEL
BKR ANION GAP: 11 (ref 7–17)
BKR BLOOD UREA NITROGEN: 12 mg/dL (ref 8–23)
BKR BUN / CREAT RATIO: 35.3 — ABNORMAL HIGH (ref 8.0–23.0)
BKR CALCIUM: 10 mg/dL (ref 8.8–10.2)
BKR CHLORIDE: 104 mmol/L (ref 98–107)
BKR CO2: 21 mmol/L (ref 20–30)
BKR CREATININE: 0.34 mg/dL — ABNORMAL LOW (ref 0.40–1.30)
BKR EGFR, CREATININE (CKD-EPI 2021): >60 mL/min/{1.73_m2} (ref >=60)
BKR GLUCOSE: 101 mg/dL — ABNORMAL HIGH (ref 70–100)
BKR POTASSIUM: 4.1 mmol/L (ref 3.3–5.3)
BKR SODIUM: 136 mmol/L (ref 136–144)

## 2022-04-06 NOTE — Progress Notes
[facility] Medicine Progress NoteAttending Provider: Georgiann Hahn, MD Subjective                                                                              Subjective: Interim History: pt is resting appears to be in good spirits. No complaints, had questions about her  [place] scan results. Discussed concerns  for recurrent fevers   Review of Allergies/Meds/Hx: Review of Allergies/Meds/Hx:I have reviewed the patient's: allergies, current scheduled medications, current infusions, current prn medications, past medical history and past surgical history Objective Objective: Vitals:Last 24 hours: Temp:  [97.2 ?F (36.2 ?C)-101.9 ?F (38.8 ?C)] 99.1 ?F (37.3 ?C)Pulse:  [82-122] 101Resp:  [16-24] 18BP: (98-151)/(63-83) 128/83SpO2:  [96 %-99 %] 98 %I/O's:Gross Totals (Last 24 hours) at 04/05/2022 1621Last data filed at 04/05/2022 0845Intake 1259 ml Output 850 ml Net 409 ml Procedures:None Physical ExamCardiovascular:    Rate and Rhythm: Normal rate and regular rhythm.    Pulses: Normal pulses. Pulmonary:    Effort: Pulmonary effort is normal. Abdominal:    Comments: G tube in place. Ileostomy Healed: large mid abdominal scar  Musculoskeletal:       General: No tenderness.    Comments: Limited ROM, noted  RA nodules on wrist and fingers .  Skin:   General: Skin is warm.    Coloration: Skin is pale. Neurological:    Mental Status: She is alert and oriented to person, place, and time. Psychiatric:       Mood and Affect: Mood normal. Labs:Last 24 hours: Recent Results (from the past 24 hour(s)) CBC auto differential  Collection Time: 04/05/22 11:07 AM Result Value Ref Range  WBC 6.7 4.0 - 11.0 x1000/?L  RBC 3.82 (L) 4.00 - 6.00 M/?L  Hemoglobin 10.2 (L) 11.7 - 15.5 g/dL  Hematocrit 16.10 (L) 96.04 - 45.00 %  MCV 85.9 80.0 - 100.0 fL  MCH 26.7 (L) 27.0 - 33.0 pg  MCHC 31.1 31.0 - 36.0 g/dL  RDW-CV 54.0 (H) 98.1 - 15.0 %  Platelets 247 150 - 420 x1000/?L  MPV 11.3 8.0 - 12.0 fL  Neutrophils 74.7 (H) 39.0 - 72.0 %  Lymphocytes 13.7 (L) 17.0 - 50.0 %  Monocytes 9.8 4.0 - 12.0 %  Eosinophils 1.3 0.0 - 5.0 %  Basophil 0.4 0.0 - 1.4 %  Immature Granulocytes 0.1 0.0 - 1.0 %  nRBC 0.0 0.0 - 1.0 %  ANC(Abs Neutrophil Count) 5.01 2.00 - 7.60 x 1000/?L  Absolute Lymphocyte Count 0.92 0.60 - 3.70 x 1000/?L  Monocyte Absolute Count 0.66 0.00 - 1.00 x 1000/?L  Eosinophil Absolute Count 0.09 0.00 - 1.00 x 1000/?L  Basophil Absolute Count 0.03 0.00 - 1.00 x 1000/?L  Absolute Immature Granulocyte Count 0.01 0.00 - 0.30 x 1000/?L  Absolute nRBC 0.00 0.00 - 1.00 x 1000/?L Comprehensive metabolic panel  Collection Time: 04/05/22 11:07 AM Result Value Ref Range  Sodium 134 (L) 136 - 144 mmol/L  Potassium 3.6 3.3 - 5.3 mmol/L  Chloride 101 98 - 107 mmol/L  CO2 20 20 - 30 mmol/L  Anion Gap 13 7 - 17  Glucose 183 (H) 70 - 100 mg/dL  BUN 11 8 - 23 mg/dL  Creatinine 1.91 (L) 4.78 -  1.30 mg/dL  Calcium 16.1 8.8 - 09.6 mg/dL  BUN/Creatinine Ratio 04.5 (H) 8.0 - 23.0  Total Protein 7.6 6.6 - 8.7 g/dL  Albumin 3.1 (L) 3.6 - 4.9 g/dL  Total Bilirubin <4.0 <=9.8 mg/dL  Alkaline Phosphatase 80 9 - 122 U/L  Alanine Aminotransferase (ALT) 17 10 - 35 U/L  Aspartate Aminotransferase (AST) 20 10 - 35 U/L  Globulin 4.5 (H) 2.3 - 3.5 g/dL  A/G Ratio 0.7 (L) 1.0 - 2.2  AST/ALT Ratio 1.2 See Comment  eGFR (Creatinine) >60 >=60 mL/min/1.85m2 Procalcitonin     (BH GH LMW Q YH)  Collection Time: 04/05/22 11:07 AM Result Value Ref Range  Procalcitonin 5.95 (H) See Comment ng/mL Diagnostics:[place] ABDOMEN PELVIS W IV CONTRAST on 04/05/2022 11:25 AM?INDICATION: Fever with unclear source. Prolonged hospitalization for perforated diverticulitis status post exploratory laparotomy with right hemicolectomy with end ileostomy.Marland Kitchen?COMPARISON: [place] abdomen pelvis 02/12/2022?TECHNIQUE: [place] images of the abdomen and pelvis were obtained after the administration of intravenous contrast.?IV contrast administered:  80 ML IOHEXOL (OMNIPAQUE) 350 MG IODINE/ML INJECTION??FINDINGS:?Lung bases: Bibasilar subsegmental atelectasis versus scarring. Coronary atherosclerotic calcifications.?Hepatobiliary: Unremarkable liver. Cholelithiasis without acute cholecystitis.?Pancreas: Unremarkable.?Spleen: Unremarkable.?Adrenal glands: Unremarkable.?Kidneys: Nonobstructing punctate bilateral renal calculi versus vascular calcifications. 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 Status post percutaneous gastrostomy tube placement. Extensive sigmoid and descending colonic diverticulosis without diverticulitis.?Abdominal and pelvic lymph nodes: No lymphadenopathy.?Peritoneum: No ascites. Persistent, but decreased in size of soft tissue nodularity with calcification along the right paracolic gutter, measuring 1.3 x 0.6 cm, previously 1.9 x 1.1 cm.?Vasculature: No abdominal aortic aneurysm. ?Pelvis: Calcified fibroid uterus.?Musculoskeletal system and soft tissue: No aggressive osseous lesion.?IMPRESSION:?1.         No evidence of abscess and no other acute [place] findings in the abdomen pelvis.2.         Persistent and similar possibly slightly decreased peritoneal nodularity.?Report Initiated By:  Idalia Needle, MD?Reported And Signed By: Glo Herring, MD  [facility] Radiology [facility]ical [facility]?ECG/Tele Events: Sinus  Tach: 108 QTC   445  Assessment Assessment: Assessment:66 y.o. female PMHx RA, recent prolonged hospitalization for perforated diverticulitis (s/p ex lap/R hemicolectomy with end ileostomy), admitted initially for encephalopathy and fever possibly 2/2 Right foot cellulitis, course c/b PEG tube dislodgement and hypercalcemia resolved, now awaiting STR and T19I have reviewed the patient's problem list and updated it as needed. Plan Plan: Fever of unknown origin- last fever was 4/15- Blood cultures sent this am- BCX from 4/12 negative to date -  abdomen pelvis negative for acute infectious process : decrease in size of peritoneal nodules- continue to hold off antibiotics - will continue with supportive care: DVT- hematology evaluated pt on 02/20/22 - continue on Lovenox 80mg  q 12 hours LE weakness and pain- evaluated by Neurology : recommended EMG pt decline- continue with Gabapentin 100 mg QHS  Right Foot Cellulitis- s/p antibiotics and x-rya- will needs an appointment with Dr Gazes in 3-4 weeks after discharge Hypercalcemia - concerned for Hypercalcemia of immobility - s/p Zolendronic acid 4 mg x 1 on 03/11/22 Dysphagia: resolved- SLP 04/01/22 advanced to regular diet with thin liquids RA _ stable- Rheumatology consulted- holdig MTX:  No indications for additional immunosuppressives- F/u with [facility] Rheumatology on discharge- if pt developed Dry mouth started Cevimeline 30mg  TID  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 [place]- . 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 PEG was ultimately placed and TFs were initiated. She is also cleared for non modified regular diet. -Awaiting T19 and LTAC Diet: tube Feed with trayPPX Lovenox 80mg  BID Dispo: -At this time, awaiting conservatorship with her sister Communication: unable to reach sister this evening to update.  Electronically Signed:Diadette Ardyth Harps APRN MHB: [phone] 04/05/2022, 4:21 PMHospitalist Attending AddendumStatement:  I have seen and examined Danielle [name]  independently. I have discussed the case in detail with the covering provider.  I have reviewed the relevant laboratory and radiology data, as well as other relevant past medical history in the chart. I agree with the findings, physical exam, assessment and the plan of care as documented in the above note.  Rest of detailed data, assessment, and plan of care per the note above. Georgiann Hahn, MDAttending physicianInternal Medicine [facility] hospitalContact via [facility]

## 2022-04-06 NOTE — Progress Notes
[facility] Medicine Progress NoteAttending Provider: Georgiann Hahn, MD Subjective                                                                              Subjective: Interim History: pt is resting comfortably, appears to be in great spirits. Concerned about the fever and what will happen next. Denies NVD or further complaints at this time Review of Allergies/Meds/Hx: Review of Allergies/Meds/Hx:I have reviewed the patient's: allergies, current scheduled medications, current infusions, current prn medications, past medical history and past surgical history Objective Objective: Vitals:Last 24 hours: Temp:  [97.5 ?F (36.4 ?C)-101.8 ?F (38.8 ?C)] 98.2 ?F (36.8 ?C)Pulse:  [69-96] 84Resp:  [16-20] 18BP: (97-117)/(63-77) 111/77SpO2:  [94 %-98 %] 98 %I/O's:Gross Totals (Last 24 hours) at 04/06/2022 1755Last data filed at 04/06/2022 1608Intake 1139 ml Output 720 ml Net 419 ml Procedures:None Physical ExamCardiovascular:    Rate and Rhythm: Normal rate. Pulmonary:    Effort: Pulmonary effort is normal.    Breath sounds: Normal breath sounds. Abdominal:    General: Abdomen is flat. There is no distension.    Palpations: Abdomen is soft.    Comments: G tube,  Stoma clean, no signs of infection  Musculoskeletal:       General: No swelling or tenderness.    Cervical back: Normal range of motion.    Comments: Limited ROM, laying in bed,  Not very active  Skin:   General: Skin is warm and dry.    Coloration: Skin is not pale. Neurological:    Mental Status: She is oriented to person, place, and time.  Labs:Last 24 hours: Recent Results (from the past 24 hour(s)) CBC auto differential  Collection Time: 04/06/22  4:48 AM Result Value Ref Range  WBC 6.0 4.0 - 11.0 x1000/?L  RBC 3.79 (L) 4.00 - 6.00 M/?L  Hemoglobin 10.3 (L) 11.7 - 15.5 g/dL  Hematocrit 16.10 (L) 96.04 - 45.00 % MCV 86.3 80.0 - 100.0 fL  MCH 27.2 27.0 - 33.0 pg  MCHC 31.5 31.0 - 36.0 g/dL  RDW-CV 54.0 (H) 98.1 - 15.0 %  Platelets 265 150 - 420 x1000/?L  MPV 10.9 8.0 - 12.0 fL  Neutrophils 49.8 39.0 - 72.0 %  Lymphocytes 26.3 17.0 - 50.0 %  Monocytes 18.9 (H) 4.0 - 12.0 %  Eosinophils 4.0 0.0 - 5.0 %  Basophil 0.7 0.0 - 1.4 %  Immature Granulocytes 0.3 0.0 - 1.0 %  nRBC 0.0 0.0 - 1.0 %  ANC(Abs Neutrophil Count) 2.98 2.00 - 7.60 x 1000/?L  Absolute Lymphocyte Count 1.57 0.60 - 3.70 x 1000/?L  Monocyte Absolute Count 1.13 (H) 0.00 - 1.00 x 1000/?L  Eosinophil Absolute Count 0.24 0.00 - 1.00 x 1000/?L  Basophil Absolute Count 0.04 0.00 - 1.00 x 1000/?L  Absolute Immature Granulocyte Count 0.02 0.00 - 0.30 x 1000/?L  Absolute nRBC 0.00 0.00 - 1.00 x 1000/?L Basic metabolic panel  Collection Time: 04/06/22  4:48 AM Result Value Ref Range  Sodium 136 136 - 144 mmol/L  Potassium 4.1 3.3 - 5.3 mmol/L  Chloride 104 98 - 107 mmol/L  CO2 21 20 - 30 mmol/L  Anion Gap 11 7 - 17  Glucose 101 (H) 70 - 100  mg/dL  BUN 12 8 - 23 mg/dL  Creatinine 1.61 (L) 0.96 - 1.30 mg/dL  Calcium 04.5 8.8 - 40.9 mg/dL  BUN/Creatinine Ratio 81.1 (H) 8.0 - 23.0  eGFR (Creatinine) >60 >=60 mL/min/1.20m2 Diagnostics:[place] ABDOMEN PELVIS W IV CONTRAST on 04/05/2022 11:25 AM?INDICATION: Fever with unclear source. Prolonged hospitalization for perforated diverticulitis status post exploratory laparotomy with right hemicolectomy with end ileostomy.Marland Kitchen?COMPARISON: [place] abdomen pelvis 02/12/2022?TECHNIQUE: [place] images of the abdomen and pelvis were obtained after the administration of intravenous contrast.?IV contrast administered:  80 ML IOHEXOL (OMNIPAQUE) 350 MG IODINE/ML INJECTION??FINDINGS:?Lung bases: Bibasilar subsegmental atelectasis versus scarring. Coronary atherosclerotic calcifications.?Hepatobiliary: Unremarkable liver. Cholelithiasis without acute cholecystitis.?Pancreas: Unremarkable.?Spleen: Unremarkable.?Adrenal glands: Unremarkable.?Kidneys: Nonobstructing punctate bilateral renal calculi versus vascular calcifications. 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 Status post percutaneous gastrostomy tube placement. Extensive sigmoid and descending colonic diverticulosis without diverticulitis.?Abdominal and pelvic lymph nodes: No lymphadenopathy.?Peritoneum: No ascites. Persistent, but decreased in size of soft tissue nodularity with calcification along the right paracolic gutter, measuring 1.3 x 0.6 cm, previously 1.9 x 1.1 cm.?Vasculature: No abdominal aortic aneurysm. ?Pelvis: Calcified fibroid uterus.?Musculoskeletal system and soft tissue: No aggressive osseous lesion.?IMPRESSION:?1.         No evidence of abscess and no other acute [place] findings in the abdomen pelvis.2.         Persistent and similar possibly slightly decreased peritoneal nodularity.?Report Initiated By:  Idalia Needle, MD?Reported And Signed By: Glo Herring, MD  [facility] Radiology and Biomedical Imaging?XR CHEST PA OR AP ?INDICATION: new fever. R/o PNA.?COMPARISON: 03/14/2022.?FINDINGS:?The cardiac silhouette is within normal limits.?The pulmonary interstitial markings are prominent, likely representing mild interstitial edema. No focal airspace consolidation, large pleural effusion or pneumothorax is seen.?IMPRESSION: Mild pulmonary interstitial edema. No focal airspace consolidation.?Reported And Signed By: Cori Razor, MD  [facility] Radiology and Biomedical ImagingECG/Tele Events: No ECG ordered today Assessment Assessment: Assessment:66 y.o.?female?PMHx RA, recent prolonged hospitalization for perforated diverticulitis (s/p ex lap/R hemicolectomy with end ileostomy), admitted initially for encephalopathy and fever possibly 2/2 Right foot cellulitis, course c/b PEG tube dislodgement and hypercalcemia resolved, now awaiting STR and T19?I have reviewed the patient's problem list and updated it as needed. Plan Plan: Fever of unknown origin- last fever was 4/15- Blood cultures sent this am ( negative to date) - BCX from 4/12 negative to date - [place] abdomen pelvis negative for acute infectious process : decrease in size of peritoneal nodules- concern for recurrent fever and possible CA .  if persistent may consult IR to biopsy peritoneal  nodules - continue to hold off antibiotics - will continue with supportive care: ??DVT- hematology evaluated pt on 02/20/22 - continue on Lovenox 80mg  q 12 hours - repeat LE ultrasound to evaluate for worsening  DVT ?LE weakness and pain- evaluated by Neurology : recommended EMG pt decline- continue with Gabapentin 100 mg QHS ? 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 [place]- . 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 PEG was ultimately placed and TFs were initiated. She is also cleared for non modified regular diet.??-Awaiting T19 and LTAC??Diet: tube Feed with trayPPX Lovenox 80mg  BID Dispo: -At this time, awaiting conservatorship with her sister Communication: unable to reach sister this evening to update. ? Electronically Signed:Deema Juncaj Ardyth Harps, APRN MHB: [phone] 04/06/2022, 5:55 PM

## 2022-04-06 NOTE — Plan of Care
Plan of Care Overview/ Patient Status    1900-0700AOX2, disoriented to time and situation. Bedbound. Room air. Ostomy x2. purwick. Bolus feeds per order through PEG. Position q2 hours. No complaints of pain, small about of tingling in bilateral LE. Fall education given and patient verbalized understanding. Call light and personal belongings within reach. Bed alarm on and bed in lowest position. Problem: Adult Inpatient Plan of CareGoal: Plan of Care ReviewOutcome: Interventions implemented as appropriate

## 2022-04-07 NOTE — Plan of Care
Plan of Care Overview/ Patient Status    Problem: Adult Inpatient Plan of CareGoal: Readiness for Transition of CareOutcome: Interventions implemented as appropriate LOS 55 days	Main barrier for LTC placement remains [name] (sister) obtaining conservator over pt. SW is in communication with [name] and the physician's evaluation has been sent to the [employer]. [name] will submit her portion of the application and await a hearing date. Once conservator has been established, LTC placement can be initiated. Elinor Parkinson, RN, MSNCare ManagerMHB: [phone]

## 2022-04-07 NOTE — Progress Notes
[facility]	 [facility]	Medicine Progress NoteAttending Provider: Donald Siva Berkley Harvey, MDPCP: Pcp, Does Not Have A NoneHospital Day: 74 Patient examined by me: 4/17/2023Subjective: CC: Fever, unspecified fever cause Interim History: Patient seen and examined at bedside. Patient wanting to work with PT. No reported BLE pain or discomfort. POC reviewed.Overnight Events: NoneReview of Systems Constitutional: Negative for chills. Respiratory: Negative for shortness of breath.  Cardiovascular: Negative for chest pain and palpitations. Gastrointestinal: Negative for abdominal pain, nausea and vomiting. Review of Allergies/Meds/Hx: I have reviewed the patient's: allergies, current scheduled medications, current infusions, current prn medications, past medical history, past surgical history, family history, social history and prior to admission medications.Scheduled Meds:Current Facility-Administered Medications Medication Dose Route Frequency Provider Last Rate Last Admin ? Banatrol Plus oral powder packet  1 packet Per G Tube BID Charlett Blake, MD   1 packet at 04/07/22 1108 ? Elemental iron 60 mg (300 mg Ferrous sulfate) / 5 mL oral liquid 45 mg  45 mg Per G Tube BID Peter Congo, Adil, MD   45 mg at 04/07/22 1103 ? enoxaparin (LOVENOX) injection 80 mg  80 mg Subcutaneous Q12H Chaar, Abdelkader, MD   80 mg at 04/07/22 1103 ? famotidine (PEPCID) suspension (8 mg/mL) 20 mg  20 mg Per G Tube Daily Bhutta, Adil, MD   20 mg at 04/07/22 1103 ? folic acid (FOLVITE) tablet 1 mg  1 mg Per G Tube Daily Bhutta, Adil, MD   1 mg at 04/07/22 1103 ? gabapentin (NEURONTIN) capsule 100 mg  100 mg Oral Nightly Kang-Giaimo, Devona Konig, MD   100 mg at 04/06/22 2054 ? niacin Immediate Release tablet 100 mg  100 mg Per G Tube Nightly Bhutta, Adil, MD   100 mg at 04/06/22 2054 ? nystatin (MYCOSTATIN) powder 1 Application  1 Application Topical (Top) BID Henriette Combs, MD   1 Application at 04/07/22 1109 ? pyridoxine (vitamin B6) (B-6) tablet 100 mg  100 mg Per G Tube Daily Bhutta, Adil, MD   100 mg at 04/07/22 1103 ? [Held by provider] rosuvastatin (CRESTOR) tablet 10 mg  10 mg Per G Tube Nightly Dorisann Frames, MD   10 mg at 02/14/22 2245 ? sodium chloride 0.9 % flush 3 mL  3 mL IV Push Q8H Dorisann Frames, MD   3 mL at 04/07/22 0533 ? thiamine (VITAMIN B1) tablet 100 mg  100 mg Per G Tube BID Peter Congo, Adil, MD   100 mg at 04/07/22 1103 Continuous Infusions:PRN Meds:.acetaminophen, dextrose (GLUCOSE) oral liquid 15 g **OR** fruit juice **OR** skim milk, dextrose (GLUCOSE) oral liquid 30 g **OR** fruit juice, dextrose injection, dextrose injection, gabapentin, glucagon, iohexol **AND** [COMPLETED] XR Abdomen AP, ondansetron **OR** ondansetron (ZOFRAN) IV Push, sodium chlorideCurrent Facility-Administered Medications Medication Dose Route Frequency Last Rate ? acetaminophen  650 mg Per G Tube Q6H PRN   ? banana flakes-t-galactooligos.  1 packet Per G Tube BID   ? dextrose (GLUCOSE) oral liquid 15 g  15 g Oral Q15 MIN PRN    Or ? fruit juice  120 mL Oral Q15 MIN PRN    Or ? skim milk  240 mL Oral Q15 MIN PRN   ? dextrose (GLUCOSE) oral liquid 30 g  30 g Oral Q15 MIN PRN    Or ? fruit juice  240 mL Oral Q15 MIN PRN   ? dextrose injection  12.5 g Intravenous Q15 MIN PRN   ? dextrose injection  25 g Intravenous Q15 MIN PRN   ?  Elemental iron  45 mg Per G Tube BID   ? enoxaparin (LOVENOX) treatment  80 mg Subcutaneous Q12H   ? famotidine  20 mg Per G Tube Daily   ? folic acid  1 mg Per G Tube Daily   ? gabapentin  100 mg Oral Nightly   ? gabapentin  100 mg Oral BID PRN   ? glucagon  1 mg Intramuscular Once PRN   ? iohexol  20 mL Per G Tube ONCE PRN   ? niacin  100 mg Per G Tube Nightly   ? nystatin  1 Application Topical (Top) BID   ? ondansetron  4 mg Oral Q6H PRN    Or ? ondansetron (ZOFRAN) IV Push  4 mg IV Push Q6H PRN   ? pyridoxine (vitamin B6)  100 mg Per G Tube Daily   ? [Held by provider] rosuvastatin  10 mg Per G Tube Nightly   ? sodium chloride  3 mL IV Push Q8H   ? sodium chloride  3 mL IV Push PRN for Line Care   ? thiamine  100 mg Per G Tube BID   Objective: Vitals: Patient Vitals for the past 24 hrs: Temp Temp src Pulse BP NIBP MAP (Monitor/Manual Entry) NIBP MAP (mmHg) (calculated/READ ONLY) Resp SpO2 04/07/22 1342 97.7 ?F (36.5 ?C) Oral (!) 96 125/74 91 91 -- 98 % 04/07/22 0756 98.8 ?F (37.1 ?C) Oral 90 108/75 86 86 18 96 % 04/07/22 0454 97.4 ?F (36.3 ?C) Oral (!) 96 112/72 85 85 20 97 % 04/06/22 2343 98 ?F (36.7 ?C) Oral (!) 101 106/70 82 82 20 95 % 04/06/22 2021 98.1 ?F (36.7 ?C) Oral (!) 95 124/83 96 96 20 97 % 04/06/22 1551 98.2 ?F (36.8 ?C) Oral 84 111/77 88 88 18 98 % Physical Exam:  Constitutional: oriented to person, and no acute distress.Marland KitchenHEENT: Head: NCAT. Eyes: EOMI. PERRL. Neck: Neck supple. Cardiovascular: RRRS1S2.  No murmursPulmonary/Chest: BS +. No crackles. No wheezes.Abdominal: Soft. BS +. Non-tender, Non-distended. Ileostomy-no blood. PEG site c/d/i.GU: PurewickMusculoskeletal: Normal ROM. Neurological: alert and oriented to person. Non focal--BLE weakness & slight drop foot.Extremities: BLE + edema. Skin: No rashesLabs:Recent Labs   04/15/231107 04/16/230448 NA 134* 136 K 3.6 4.1 CL 101 104 CO2 20 21 BUN 11 12 CREATININE 0.28* 0.34* Recent Labs   04/15/231107 04/16/230448 WBC 6.7 6.0 HGB 10.2* 10.3* HCT 32.80* 32.70* PLT 247 265 Recent Labs   04/15/231107 ALT 17 AST 20 BILITOT <0.2 Micro: Covid negativeProcedures:None IV Access: PIVDiagnostics:No new radiology.No results found for this or any previous visit.ECG/Tele Events: I have reviewed the patient's ECG as resulted in the EMR.Assessment: Assessment: 66 y.o. female with PMH of RA, recent prolonged hospitalization for perforated diverticulitis (s/p ex Lap/R hemicolectomy with end ileostomy). Admitted 2/24 for LTAC placement and persistent encephalopathy. Found to be febrile without clear infectious source found, fevers resolved. Extensive work-up of encephalopathy and leg weakness unremarkable. Course c/b elevated LFTs which resolved, likely 2/2 DILI from ceftriaxone or Zosyn. Stable and awaiting T19/LTC placement, but with ongoing significant hypercalcemia.?Mental status appears improved.Plan: Recurrent Fever/R-foot Cellultis:-transient fever resolved--last fever 04/05/22, no leukocytosis-No clear source identified, possibly 2/2 DVT vs autoimmune?-BCx-04/02/22--NGTD-S/p CTX 2/21 and Zosyn 2/21-2/25-CXR-no acute pathology-prior UA-benign-completed ABX-Oxacillin-consulted Podiatry--appreciated recs-CRP 25.8, PCT-benign -trend fever/CBC-Tylenol prn?Hypercalcemia:-Acute on?chronic--resolved-PTH normal (28); work-up including PTH-rP (9), vitamin D testing, TSH, random cortisol unrevealing-Possibly d/t immobility or primary hyperparathyroidism; sent additional PTH testing with mineral metabolism level (send-out test)-repeat PTH 33.6, am Cortisol 8.9-Endocrine consult  recs: possible additional therapy pending PTH  results. -s/p Zolendronic acid 4 mg X1 (3/21)-fasting CTx (Collagen Type-1 Telopeptide)-elevated?Acute encephalopathy:LE weakness:-Unclear etiology, possibly prolonged delirium iso hospitalization from Nov 2022 - currentMRI brain, MRI total spine unremarkable-LP unremarkable-Paraneoplastic and autoimmune encephalopathy panels negative-Overall somewhat improved cognitive status during admission, remains alert and oriented x 1-2-Neurology rec'd EMG to complete work-up of LE weakness but pt declined; Neurology does not recommend any further testing and signed off 02/26/22-continue with Gabapentin 100 mg QHS -LLE US-pending?Perforated diverticulum: -s/p R hemicolectomy, end ileostomy/LFT elevation-c/b fluid abscess requiring drainage during Nov 2022 admission in [place]. [place]-?. 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?on this admission?now [place] scan shows improved Peritoneal nodules?Per 3/30 notes, seems imaging was reviewed and that nodules were improving and thought to be resolution of continued debris.-Work-up showed elevated IgG, smooth muscle antibodies c/f possible autoimmune hepatitisInitial concern that imaging showed peritoneal nodules ?carcinomatosis-On review of imaging, nodules improving, more likely to be continued resolution of debris from perforation-Liver tests resolved without intervention, so most likely DILI from antibiotics (ceftriaxone and Zosyn)?R lateral foot and L medial eminence deep tissue injuries:-Stable-Xray feet with degenerative changes but no acute findings-?Podiatry following - no dressing requirements, WBAT to BLE.No?indication for podiatric surgical intervention at this time. ?Patient may follow-up with Dr. Gazes within 3-4 weeks of discharge. -?No need for abx at this time ?Hx Bilateral DVT:- Apixaban 5mg  BID-HELD-c/w Lovenox 80 mg q12H?Elevated LFC ratio:Iron deficiency anemia:-Hematology consult &  signed off - elevated FLC ratio felt to be reactive, plasma cell disorder unlikely ?Chronic conditions:-Rheumatoid Arthritis - has been off of MTX since November/acute illness, rheumatology signing off current ams/weakness not felt to be related to her RA, follow up outpatient -Bilateral LE DVT - ?provoked from recent surgery, cont lovenox, can resume apixaban at discharge?Dysphagia:-resolved-s/p  TF + FWF-SLP consulted--for re-evluation-aspiration precautionsGOC and Capacity:- during hospitalization November 2022 with perforated diverticulum, deemed at this time not to have capacity. PEG was ultimately placed and TFs were initiated. She also cleared for non modified regular diet.FEN: diet, K>4, Mg>2 Lines: PIV Tubes: NoneMobility: pendingPT/OT: --> LTACDisposition: Awaiting T19 and LTAC?Medication Reconciliation: In ProgressCommunication: pendingSigned:Shahil Speegle Vivi Martens, MD, MSc  Communication via [facility] Hospitalist Service4/17/2023, 3:04 PMTime: >35  minutes : time spent obtaining history from patient or surrogate, review of medical records/prior chart documentation , examining/evaluating patient, ordering or performing treatments/interventions; ordering/reviewing laboratory studies, radiographic studies, pulse oximetry; re-evaluation of patient's condition and development of treatment plan, discussions with consultants, evaluation of patient's response to treatment. With 50% of time spent in care follow-up/care coordination, plan of care discussion with consulting specialities, and with the patient and/or patient's family/HCPOA/Surrogate Code Status Full Code DVT Prophylaxis  Lovenox Sc  ADMIT MED REC In Progress PCP Pcp, Does Not Have A None Emergency contact Extended Emergency Contact InformationPrimary Emergency Contact: McCulley,ReneeHome Phone: [phone] Disposition TBD

## 2022-04-07 NOTE — Plan of Care
Plan of Care Overview/ Patient Status     Pt  Ax1-2.  Room air, LUQ peg tube with bolus feedings scheduled per orders. Pt also able to feed self with assistance. Left ostomy with no drainage and right ostomy with green watery drainage. Purewick in place, pt incontinent, pt is bedbound and an assist x 2. Call bell within reach. Bed alarm on. Bed in the locked and lowest position. Pt turned q 2 hrs. Safety maintained. Hourly rounding with a purpose completed. See flowsheet for additional information.

## 2022-04-07 NOTE — Other
Per notes, pouch from 04/04/22 changed by this provider had leaked overnight. Per documentation, a convex pouch was applied (as recommended) and pouch has remained in place.Ostomy Recommendations:Change pouch every 3 days and PRN:-Cleanse skin w/ warm water - dry-Apply ostomy powder to moist skin, wipe off excess powder-Apply barrier spray over powder, allow to dry-Cut wafer of appliance to fit over stoma (53mmx35mm)-Apply barrier ring to wafer then apply to patient-Use fingers/hands to seal wafer, start from right around the stoma and work towards edges-Apply barrier strips to outer edge of wafer-Use appliance belt for better securement if needed (be sure feet of belt are facing towards patient?Product #s (check supply room prior to ordering):Ostomy powder [id number] Barrier sprayBarrier ring #[id number] Barrier strips #[id number] Convex pouch #[id number] Belt #[id number] Hurshel Party, RN, CWONMHB#: [phone]/17/2023 6:50 AM

## 2022-04-07 NOTE — Plan of Care
Plan of Care Overview/ Patient Status    1900-2330 - pt A+Ox1-2, smiling pleasant, cooperative, resting comfortably, bolus feeding given via GT this shift, tolerated, no N+V, R ostomy w/ 200 liquid output, no output on L ostomy, will cont to monitor.

## 2022-04-07 NOTE — Plan of Care
Plan of Care Overview/ Patient Status    Medical Social Work Follow Up  [employer] Most Recent Value Admission Information  Document Type Progress Note Reason for Inability to Assess --  [Patient mental status is altered] (For Inpatient/ED Only) Prior psychosocial assessment has been documented within this hospitalization No (For Inpatient/ED Only) Prior psychosocial assessment has been documented within 30 days of this hospitalization No Reason for Current Social Work Involvement Acupuncturist Medical Team Comment Medical Team Community Agency Comment [place] [address] Record Reviewed Yes Level of Care Inpatient Psychosocial issues requiring intervention Conservatorship Psychosocial interventions 5 minutes spent on the phone with [employer]. [name] informed that she has submitted the application for conservatorship and is pending a court date. I thanked [name] for the information. Collaborations [employer] Specific referrals to enhance community supports (include existing and new resources) None Handoff Required? No Next Steps/Plan (including hand-off): I will remain involved for support as needed. Signature: Leim Fabry, LCSW Contact Information: [phone]

## 2022-04-07 NOTE — Plan of Care
Inpatient Physical Therapy Progress Note IP Adult PT Eval/Treat - 04/07/22 1056    Date of Visit / Treatment  Date of Visit / Treatment 04/07/22   Note Type Progress Note   Progress Report Due 04/30/22   End Time 1056    General Information  Subjective What are we gonna do?   General Observations Pt received supine in bed; on RA; ostomy; purwick; PEG; RN cleared   Precautions/Limitations Fall Precautions;Bed alarm;Chair alarm   Precautions/Limitations Comment WBAT BLE    Weight Bearing Status  Weight Bearing Status Comments WBAT BLE    Vital Signs and Orthostatic Vital Signs  Vital Signs Vital Signs Stable   Vital Signs Free text RA; NAD    Pain/Comfort  Pain Comment (Pre/Post Treatment Pain) No c/o pain pre/post session    Patient Coping  Observed Emotional State accepting;cooperative;anxious   Verbalized Emotional State acceptance    Cognition  Overall Cognitive Status Impaired   Orientation Level Oriented to person;Oriented to place;Oriented to time   Place and time w/ cues and incr time  Level of Consciousness alert;confused   Following Commands Follows one step commands with increased time;Follows one step commands with repetition   Personal Safety / Judgment Fall risk;Requires supervision with mobility   Attention Requires redirection;Requires cues    Range of Motion  Range of Motion Comments Tight B HM and heel cords    Manual Muscle Testing  Manual Muscle Testing Comments + Deconditioning    Skin Assessment  Skin Assessment See Nursing Documentation    Balance  Sitting Balance: Static  FAIR-      Contact Guard to maintain static position with no Assistive Device   Sitting Balance: Dynamic  POOR+   Moves through 1/2 range with minimal assist to right self   Balance Assist Device Hand held assist   Balance Skills Training Comment Ax1    Bed Mobility  Supine-to-Sit Independence/Assistance Level Maximum assist;Assist of 1   Supine-to-Sit Assist Device Hand held assist;Head of bed elevated;Draw pad;Bed rails   Sit-to-Supine Independence/Assistance Level Maximum assist;Assist of 1   Sit-to-Supine Assist Device Head of bed elevated;Hand held assist;Draw pad   Bed Mobility, Impairments strength decreased;pain   Bed mobility was limited by: impaired motor function;pain   Bed Mobility Comments Improved seated postural control w/ less retropulsion    Sit-Stand Transfer Training  Sit-Stand Transfer Comments Deferred d/t weakness and unsteadiness    Handoff Documentation  Handoff Patient in bed;Bed alarm;Patient instructed to call nursing for mobility;Discussed with nursing   Handoff Comments RN present d/t ostomy bag leak; boosted in bed w/ RN    Endurance  Endurance Comments poor+    PT- AM-PAC - Basic Mobility Screen- How much help from another person do you currently need.....  Turning from your back to your side while in a a flat bed without using rails? 1 - Total - Requires total assistance or cannot do it at all.   Moving from lying on your back to sitting on the side of a flat bed without using bed rails? 1 - Total - Requires total assistance or cannot do it at all.   Moving to and from a bed to a chair (including a wheelchair)? 1 - Total - Requires total assistance or cannot do it at all.   Standing up from a chair using your arms(e.g., wheelchair or bedside chair)? 1 - Total - Requires total assistance or cannot do it at all.   To walk in a hospital room? 1 -  Total - Requires total assistance or cannot do it at all.   Climbing 3-5 steps with a railing? 1 - Total - Requires total assistance or cannot do it at all.   AMPAC Mobility Score 6   TARGET Highest Level of Mobility Mobility Level 2, Turn self in bed/bed activity/dependent transfer    ACTUAL Highest Level of Mobility Mobility Level 3, Sit on edge of bed    Therapeutic Exercise  Therapeutic Exercise Comments AROM x4; PROM B HM and heel cords    Neuromuscular Re-education  Neuromuscular Re-education Comments Seated postural control training w/ crossbody alt. BUE reaches    Therapeutic Functional Activity  Therapeutic Functional Activity Comments Bed mobility; Pt edu    Clinical Impression  Follow up Assessment Pt seen for PT f/u. Continues to require maxA for bed mobility. Displayed less retropulsion and improved trunk control seated EOB statically; w/ dynamic movements, increased retropulsion requiring VCs to correct. PROM to BLE and alt BUE cross body reaches seated EOB. Will benefit from skilled services to improve functional ROM/strength, dynamic postural control, and activity tolereance. Current PT d/c rec is STR.   Criteria for Skilled Therapeutic Interventions Met yes    Patient/Family Stated Goals  Patient/Family Stated Goal(s) feel better    Frequency/Equipment Recommendations  PT Frequency 2x per week   What day of week is next treatment expected? Thursday   4/20  PT/PTA completing this assessment RL   Equipment Needs During Admission/Treatment No device    PT Recommendations for Inpatient Admission  Activity/Level of Assist dangle edge of bed;assist of 2   lift OOB Ax2   Planned Treatment / Interventions  Plan for Next Visit Progress as tol   Training Treatment / Interventions Balance / Investment banker, operational;Endurance Training;Functional Personnel officer / Interventions Patient Education / Training    PT Discharge Summary  Physical Therapy Disposition Recommendation Short Term Rehab     Shanon Payor PT, [place]: [phone]

## 2022-04-07 NOTE — Plan of Care
Plan of Care Overview/ Patient Status    0700-1900A&O x 2, disoriented to time & situation, lungs clear upper & diminished bases, on RA. Ileostomies RUE & LUQ; R. illeostomy draining green, brown watery stool, with flatus, left illeostomy with no flatus or stool. Incontinent of urine, purewick in place, draining yellow urine. PEG tube LUQ, insertion site pink with scant tan drainage, skin care and dressing changed done, new anchor applied. PEG patent with minimal residual < 10 cc during afternoon feed, returned to pt.;TF bolus Jevity 1.5 as ordered, tolerating well. Pt. on regular diet too, able to feed self with supervision, refused breakfast and dinner, agreeable for lunch, able to make selections, fair intake. T&R A x 2 in bed; heel lift boots on all day, requested a break from boots by end of shift, boots off (oncoming RN aware). Oral care done by patient, assist with set up, able to brush own teeth. Denies pain, c/o  sensitivity when BLE touched, supported with pillows.

## 2022-04-08 ENCOUNTER — Inpatient Hospital Stay: Admit: 2022-04-08 | Payer: PRIVATE HEALTH INSURANCE

## 2022-04-08 DIAGNOSIS — R627 Adult failure to thrive: Secondary | ICD-10-CM

## 2022-04-08 LAB — CBC WITHOUT DIFFERENTIAL
BKR WAM ANALYZER ANC: 2.51 x 1000/ÂµL (ref 2.00–7.60)
BKR WAM HEMATOCRIT (2 DEC): 36.3 % (ref 35.00–45.00)
BKR WAM HEMOGLOBIN: 11 g/dL — ABNORMAL LOW (ref 11.7–15.5)
BKR WAM MCH (PG): 26.8 pg — ABNORMAL LOW (ref 27.0–33.0)
BKR WAM MCHC: 30.3 g/dL — ABNORMAL LOW (ref 31.0–36.0)
BKR WAM MCV: 88.3 fL (ref 80.0–100.0)
BKR WAM MPV: 11 fL (ref 8.0–12.0)
BKR WAM PLATELETS: 256 x1000/ÂµL (ref 150–420)
BKR WAM RDW-CV: 16.1 % — ABNORMAL HIGH (ref 11.0–15.0)
BKR WAM RED BLOOD CELL COUNT.: 4.11 M/ÂµL (ref 4.00–6.00)
BKR WAM WHITE BLOOD CELL COUNT: 4.8 x1000/ÂµL (ref 4.0–11.0)

## 2022-04-08 LAB — COMPREHENSIVE METABOLIC PANEL
BKR A/G RATIO: 0.7 — ABNORMAL LOW (ref 1.0–2.2)
BKR ALANINE AMINOTRANSFERASE (ALT): 20 U/L (ref 10–35)
BKR ALBUMIN: 3.3 g/dL — ABNORMAL LOW (ref 3.6–4.9)
BKR ALKALINE PHOSPHATASE: 83 U/L (ref 9–122)
BKR ANION GAP: 14 (ref 7–17)
BKR ASPARTATE AMINOTRANSFERASE (AST): 26 U/L (ref 10–35)
BKR AST/ALT RATIO: 1.3
BKR BILIRUBIN TOTAL: 0.2 mg/dL (ref <=1.2)
BKR BLOOD UREA NITROGEN: 14 mg/dL (ref 8–23)
BKR BUN / CREAT RATIO: 46.7 — ABNORMAL HIGH (ref 8.0–23.0)
BKR CALCIUM: 9.9 mg/dL (ref 8.8–10.2)
BKR CHLORIDE: 105 mmol/L (ref 98–107)
BKR CO2: 20 mmol/L (ref 20–30)
BKR CREATININE: 0.3 mg/dL — ABNORMAL LOW (ref 0.40–1.30)
BKR EGFR, CREATININE (CKD-EPI 2021): >60 mL/min/{1.73_m2} (ref >=60)
BKR GLOBULIN: 4.9 g/dL — ABNORMAL HIGH (ref 2.3–3.5)
BKR GLUCOSE: 134 mg/dL — ABNORMAL HIGH (ref 70–100)
BKR POTASSIUM: 4.2 mmol/L (ref 3.3–5.3)
BKR PROTEIN TOTAL: 8.2 g/dL (ref 6.6–8.7)
BKR SODIUM: 139 mmol/L (ref 136–144)

## 2022-04-08 LAB — PROCALCITONIN     (BH GH LMW Q YH): BKR PROCALCITONIN: 2.08 ng/mL — ABNORMAL HIGH

## 2022-04-08 LAB — BLOOD CULTURE   (BH GH L LMW YH)
BKR BLOOD CULTURE: NO GROWTH
BKR BLOOD CULTURE: NO GROWTH

## 2022-04-08 MED ORDER — MELATONIN 3 MG TABLET
3 mg | Freq: Every evening | GASTROSTOMY | Status: DC | PRN
Start: 2022-04-08 — End: 2022-04-09
  Administered 2022-04-09: 01:00:00 3 mg via GASTROSTOMY

## 2022-04-08 MED ORDER — LIDOCAINE 5 % ADHESIVE PATCH
5 % | TRANSDERMAL | Status: DC
Start: 2022-04-08 — End: 2022-05-29

## 2022-04-08 MED ORDER — MELATONIN 3 MG TABLET
3 mg | Freq: Every evening | ORAL | Status: DC | PRN
Start: 2022-04-08 — End: 2022-04-08

## 2022-04-08 NOTE — Plan of Care
Plan of Care Overview/ Patient Status    0700-1900A&O x 2, disoriented to time & situation, lungs clear upper & diminished bases, on RA. Ileostomies RUE & LUQ; R. illeostomy draining brown watery stool, with flatus, left illeostomy with no flatus or stool. Incontinent of urine, purewick in place, draining yellow urine. PEG tube LUQ, insertion site pink with scant tan drainage, skin care and dressing changed done, anchor in place. PEG patent with no residual. TF bolus Jevity 1.5 as ordered, tolerating well. Pt. on regular diet too, able to feed self with supervision, refused breakfast, bites of lunch, refused dinner; fluids encouraged. T&R A x 2 in bed; heel lift boots on. Oral care done. Denies pain, c/o  sensitivity when BLE touched, Tylenol PRN with good effect.Sister visited today assisted with grooming patient.

## 2022-04-08 NOTE — Plan of Care
Plan of Care Overview/ Patient Status    0700 - 1900Patient alert and oriented x 2 with periods of confusion. Vital signs stable, room air. Assist X 2 in bed. Tube feed diet with tray; medication administered via PEG tube. Lung sounds clear, bowel sounds present. Incontinent of bowel and bladder; purewick in place connected to suction. 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 Ileostomy sites intact, appliance emptied. IV to left arm, patent and intact. Repositioned Q 2 hours. Patient awaiting transfer to short term rehab. Problem: Adult Inpatient Plan of CareGoal: Plan of Care Review4/18/2023 1843 by Yetta Barre, RNFlowsheets (Taken 04/08/2022 1843)Progress: no changePlan of Care Reviewed With: patient4/18/2023 1843 by Yetta Barre, RNOutcome: Interventions implemented as appropriate Problem: Adult Inpatient Plan of CareGoal: Patient-Specific Goal (Individualized)04/08/2022 1843 by Yetta Barre, RNFlowsheets (Taken 04/08/2022 0805)What Anxieties, Fears, Concerns or Questions Do You Have About Your Care?: None verbalizedIndividualized Preferences and Care Needs: Per POCPatient Centered Long Term Goal: Safe d/cPatient Centered Daily Goal: Safety/ comfort4/18/2023 1843 by Yetta Barre, RNOutcome: Interventions implemented as appropriate

## 2022-04-08 NOTE — Progress Notes
[facility]	 [facility]	Medicine Progress NoteAttending Provider: Donald Siva Berkley Harvey, MDPCP: Pcp, Does Not Have A NoneHospital Day: 11 Patient examined by me: 4/18/2023Subjective: CC: Fever, unspecified fever cause Interim History: Patient seen and examined at bedside. Patient slept poorly last night. Agrees to Melatonin prn.. No reported BLE pain or discomfort. POC reviewed.Overnight Events: NoneReview of Systems Constitutional: Negative for chills. Respiratory: Negative for shortness of breath.  Cardiovascular: Negative for chest pain and palpitations. Gastrointestinal: Negative for abdominal pain, nausea and vomiting. Review of Allergies/Meds/Hx: I have reviewed the patient's: allergies, current scheduled medications, current infusions, current prn medications, past medical history, past surgical history, family history, social history and prior to admission medications.Scheduled Meds:Current Facility-Administered Medications Medication Dose Route Frequency Provider Last Rate Last Admin ? Banatrol Plus oral powder packet  1 packet Per G Tube BID Charlett Blake, MD   1 packet at 04/08/22 0805 ? Elemental iron 60 mg (300 mg Ferrous sulfate) / 5 mL oral liquid 45 mg  45 mg Per G Tube BID Peter Congo, Adil, MD   45 mg at 04/08/22 0805 ? enoxaparin (LOVENOX) injection 80 mg  80 mg Subcutaneous Q12H Chaar, Abdelkader, MD   80 mg at 04/08/22 0805 ? famotidine (PEPCID) suspension (8 mg/mL) 20 mg  20 mg Per G Tube Daily Peter Congo, Adil, MD   20 mg at 04/08/22 0803 ? folic acid (FOLVITE) tablet 1 mg  1 mg Per G Tube Daily Bhutta, Adil, MD   1 mg at 04/08/22 2956 ? gabapentin (NEURONTIN) capsule 100 mg  100 mg Oral Nightly Kang-Giaimo, Devona Konig, MD   100 mg at 04/07/22 2242 ? niacin Immediate Release tablet 100 mg  100 mg Per G Tube Nightly Bhutta, Adil, MD   100 mg at 04/07/22 2242 ? nystatin (MYCOSTATIN) powder 1 Application  1 Application Topical (Top) BID Henriette Combs, MD   1 Application at 04/08/22 [phone] ? pyridoxine (vitamin B6) (B-6) tablet 100 mg  100 mg Per G Tube Daily Peter Congo, Adil, MD   100 mg at 04/08/22 0804 ? [Held by provider] rosuvastatin (CRESTOR) tablet 10 mg  10 mg Per G Tube Nightly Dorisann Frames, MD   10 mg at 02/14/22 2245 ? sodium chloride 0.9 % flush 3 mL  3 mL IV Push Q8H Dorisann Frames, MD   3 mL at 04/07/22 2244 ? thiamine (VITAMIN B1) tablet 100 mg  100 mg Per G Tube BID Peter Congo, Adil, MD   100 mg at 04/08/22 0805 Continuous Infusions:PRN Meds:.acetaminophen, dextrose (GLUCOSE) oral liquid 15 g **OR** fruit juice **OR** skim milk, dextrose (GLUCOSE) oral liquid 30 g **OR** fruit juice, dextrose injection, dextrose injection, gabapentin, glucagon, iohexol **AND** [COMPLETED] XR Abdomen AP, melatonin, ondansetron **OR** ondansetron (ZOFRAN) IV Push, sodium chlorideCurrent Facility-Administered Medications Medication Dose Route Frequency Last Rate ? acetaminophen  650 mg Per G Tube Q6H PRN   ? banana flakes-t-galactooligos.  1 packet Per G Tube BID   ? dextrose (GLUCOSE) oral liquid 15 g  15 g Oral Q15 MIN PRN    Or ? fruit juice  120 mL Oral Q15 MIN PRN    Or ? skim milk  240 mL Oral Q15 MIN PRN   ? dextrose (GLUCOSE) oral liquid 30 g  30 g Oral Q15 MIN PRN    Or ? fruit juice  240 mL Oral Q15 MIN PRN   ? dextrose injection  12.5 g Intravenous Q15 MIN PRN   ? dextrose injection  25 g Intravenous Q15  MIN PRN   ? Elemental iron  45 mg Per G Tube BID   ? enoxaparin (LOVENOX) treatment  80 mg Subcutaneous Q12H   ? famotidine  20 mg Per G Tube Daily   ? folic acid  1 mg Per G Tube Daily   ? gabapentin  100 mg Oral Nightly   ? gabapentin  100 mg Oral BID PRN   ? glucagon  1 mg Intramuscular Once PRN   ? iohexol  20 mL Per G Tube ONCE PRN   ? melatonin  3 mg Per G Tube Nightly PRN   ? niacin  100 mg Per G Tube Nightly ? nystatin  1 Application Topical (Top) BID   ? ondansetron  4 mg Oral Q6H PRN    Or ? ondansetron (ZOFRAN) IV Push  4 mg IV Push Q6H PRN   ? pyridoxine (vitamin B6)  100 mg Per G Tube Daily   ? [Held by provider] rosuvastatin  10 mg Per G Tube Nightly   ? sodium chloride  3 mL IV Push Q8H   ? sodium chloride  3 mL IV Push PRN for Line Care   ? thiamine  100 mg Per G Tube BID   Objective: Vitals: Patient Vitals for the past 24 hrs: Temp Temp src Pulse BP NIBP MAP (Monitor/Manual Entry) NIBP MAP (mmHg) (calculated/READ ONLY) Resp SpO2 04/08/22 1234 97.9 ?F (36.6 ?C) Oral 88 114/76 89 89 18 99 % 04/08/22 0759 97.9 ?F (36.6 ?C) Oral (!) 91 113/70 -- 84 18 96 % 04/08/22 0352 97.8 ?F (36.6 ?C) Oral 80 107/66 80 80 20 96 % 04/07/22 2321 98.2 ?F (36.8 ?C) Oral (!) 94 111/73 86 86 20 96 % 04/07/22 1955 98.4 ?F (36.9 ?C) Oral (!) 100 130/71 91 91 16 97 % 04/07/22 1647 98.4 ?F (36.9 ?C) Oral 84 97/61 73 73 20 99 % 04/07/22 1342 97.7 ?F (36.5 ?C) Oral (!) 96 125/74 91 91 -- 98 % Physical Exam:  Constitutional: oriented to person, and no acute distress.Marland KitchenHEENT: Head: NCAT. Eyes: EOMI. PERRL. Neck: Neck supple. Cardiovascular: RRRS1S2.  No murmursPulmonary/Chest: BS +. No crackles. No wheezes.Abdominal: Soft. BS +. Non-tender, Non-distended. Ileostomy-no blood. PEG site c/d/i.GU: PurewickMusculoskeletal: Normal ROM. Neurological: alert and oriented to person. Non focal--BLE weakness & slight drop foot.Extremities: BLE + edema. Skin: No rashesLabs:Recent Labs   04/16/230448 04/18/230852 NA 136 139 K 4.1 4.2 CL 104 105 CO2 21 20 BUN 12 14 CREATININE 0.34* 0.30* Recent Labs   04/16/230448 04/18/230852 WBC 6.0 4.8 HGB 10.3* 11.0* HCT 32.70* 36.30 PLT 265 256 Recent Labs   04/18/230852 ALT 20 AST 26 BILITOT 0.2 Micro: Covid negativeProcedures:None IV Access: PIVDiagnostics:No new radiology.No results found for this or any previous visit.ECG/Tele Events: I have reviewed the patient's ECG as resulted in the EMR.Assessment: Assessment: 66 y.o. female with PMH of RA, recent prolonged hospitalization for perforated diverticulitis (s/p ex Lap/R hemicolectomy with end ileostomy). Admitted 2/24 for LTAC placement and persistent encephalopathy. Found to be febrile without clear infectious source found, fevers resolved. Extensive work-up of encephalopathy and leg weakness unremarkable. Course c/b elevated LFTs which resolved, likely 2/2 DILI from ceftriaxone or Zosyn. Stable and awaiting T19/LTC placement, but with ongoing significant hypercalcemia.?Mental status appears improved.Plan: Hx Recurrent Fever/R-foot Cellultis:-transient fever resolved--last fever 04/05/22, no leukocytosis-PCT decreased 5.95--> 2.08-No clear source identified, possibly 2/2 DVT vs autoimmune?-BCx-04/02/22--NGTD-S/p CTX 2/21 and Zosyn 2/21-2/25-CXR-no acute pathology-prior UA-benign-completed ABX-Oxacillin-consulted Podiatry--appreciated recs -trend fever/CBC prn-Tylenol prn?Hypercalcemia:-Acute on?chronic--resolved-PTH normal (28); work-up including PTH-rP (9), vitamin D testing,  TSH, random cortisol unrevealing-Possibly d/t immobility or primary hyperparathyroidism; sent additional PTH testing with mineral metabolism level (send-out test--pending)-Endocrine consulted -s/p Zolendronic acid 4 mg X1 (3/21)-fasting CTx (Collagen Type-1 Telopeptide)-elevated?Acute encephalopathy:LE weakness:-Unclear etiology, possibly prolonged delirium iso hospitalization from Nov 2022 - currentMRI brain, MRI total spine unremarkable-LP unremarkable-Paraneoplastic and autoimmune encephalopathy panels negative-Overall somewhat improved cognitive status during admission, remains alert and oriented x 1-2-Neurology rec'd EMG to complete work-up of LE weakness but pt declined; Neurology does not recommend any further testing and signed off 02/26/22-continue with Gabapentin 100 mg QHS -LLE US-pending?Perforated diverticulum: -s/p R hemicolectomy, end ileostomy/LFT elevation-c/b fluid abscess requiring drainage during Nov 2022 admission in [place]. [place]-?. 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?on this admission?now [place] scan shows improved Peritoneal nodules?Per 3/30 notes, seems imaging was reviewed and that nodules were improving and thought to be resolution of continued debris.-Work-up showed elevated IgG, smooth muscle antibodies c/f possible autoimmune hepatitisInitial concern that imaging showed peritoneal nodules ?carcinomatosis-On review of imaging, nodules improving, more likely to be continued resolution of debris from perforation-Liver tests resolved without intervention, so most likely DILI from antibiotics (ceftriaxone and Zosyn)?R lateral foot and L medial eminence deep tissue injuries:-Stable-Xray feet with degenerative changes but no acute findings-?Podiatry following - no dressing requirements, WBAT to BLE.No?indication for podiatric surgical intervention at this time. ?Patient may follow-up with Dr. Gazes within 3-4 weeks of discharge. -?No need for abx at this time ?Hx Bilateral DVT:- Apixaban 5mg  BID-HELD-c/w Lovenox 80 mg q12H?Elevated LFC ratio:Iron deficiency anemia:-Hematology consult &  signed off - elevated FLC ratio felt to be reactive, plasma cell disorder unlikely ?Chronic conditions:-Rheumatoid Arthritis - has been off of MTX since November/acute illness, rheumatology signing off current ams/weakness not felt to be related to her RA, follow up outpatient -Bilateral LE DVT - ?provoked from recent surgery, cont lovenox, can resume apixaban at discharge?Dysphagia:-resolved-s/p  TF + FWF-SLP consulted--for re-evluation-aspiration precautionsGOC and Capacity:- during hospitalization November 2022 with perforated diverticulum, deemed at this time not to have capacity. PEG was ultimately placed and TFs were initiated. She also cleared for non modified regular diet.FEN: diet, K>4, Mg>2 Lines: PIV Tubes: NoneMobility: pendingPT/OT: --> LTACDisposition: Awaiting T19 and LTAC?Medication Reconciliation: In ProgressCommunication: pendingSigned:Garlon Tuggle Vivi Martens, MD, MSc  Communication via [facility] Hospitalist Service4/18/2023, 12:47 PMTime: >35  minutes : time spent obtaining history from patient or surrogate, review of medical records/prior chart documentation , examining/evaluating patient, ordering or performing treatments/interventions; ordering/reviewing laboratory studies, radiographic studies, pulse oximetry; re-evaluation of patient's condition and development of treatment plan, discussions with consultants, evaluation of patient's response to treatment. With 50% of time spent in care follow-up/care coordination, plan of care discussion with consulting specialities, and with the patient and/or patient's family/HCPOA/Surrogate Code Status Full Code DVT Prophylaxis  Lovenox Sc  ADMIT MED REC In Progress PCP Pcp, Does Not Have A None Emergency contact Extended Emergency Contact InformationPrimary Emergency Contact: McCulley,ReneeHome Phone: [phone] Disposition TBD

## 2022-04-08 NOTE — Other
Reported patient status & updated POC to patient's sister [name]. All questions answered.

## 2022-04-08 NOTE — Plan of Care
Plan of Care Overview/ Patient Status    Pt A+Ox2, RA, VSS. Ostomy bags changed overnight. Good output from RLQ ostomy. PIV to left forearm. Pt upset that she didn't get to sleep well d/t interventions. Tolerated TF with no N/V. Turned and repositioned. Pending d/c planning. Problem: Adult Inpatient Plan of CareGoal: Plan of Care ReviewOutcome: Interventions implemented as appropriateGoal: Patient-Specific Goal (Individualized)Outcome: Interventions implemented as appropriateGoal: Absence of Hospital-Acquired Illness or InjuryOutcome: Interventions implemented as appropriateGoal: Optimal Comfort and WellbeingOutcome: Interventions implemented as appropriateGoal: Readiness for Transition of CareOutcome: Interventions implemented as appropriate Problem: InfectionGoal: Absence of Infection Signs and SymptomsOutcome: Interventions implemented as appropriate Outcome: Interventions implemented as appropriateProblem: Violence Risk or ActualGoal: Anger and Impulse ControlOutcome: Interventions implemented as appropriate Problem: Fall Injury RiskGoal: Absence of Fall and Fall-Related InjuryOutcome: Interventions implemented as appropriate Problem: Mobility ImpairmentGoal: Optimal MobilityOutcome: Interventions implemented as appropriate Outcome: Interventions implemented as appropriate

## 2022-04-09 MED ORDER — CAMPHOR-MENTHOL 0.5 %-0.5 % LOTION
0.5-0.5 % | Freq: Two times a day (BID) | TOPICAL | Status: DC
Start: 2022-04-09 — End: 2022-06-30
  Administered 2022-04-09 – 2022-06-29 (×139): via TOPICAL

## 2022-04-09 MED ORDER — MELATONIN 3 MG TABLET
3 mg | Freq: Every evening | GASTROSTOMY | Status: DC
Start: 2022-04-09 — End: 2022-04-19
  Administered 2022-04-10 – 2022-04-19 (×10): 3 mg via GASTROSTOMY

## 2022-04-09 MED ORDER — TRAMADOL (ULTRAM) 25 MG HALFTAB
25 mg | Freq: Once | ORAL | Status: CP
Start: 2022-04-09 — End: ?
  Administered 2022-04-09: 15:00:00 25 mg via ORAL

## 2022-04-09 NOTE — Plan of Care
Plan of Care Overview/ Patient Status    Pt is A+Ox2 with some confusions. She is able to make her needs known. C/o pain to her L ankle, lidocaine patch placed per order with  good results. Repositioned in bed frequently. TF of Jevity 1.5 bolus given via GT - tolerated well with no discomforts noted. GT site CDI.  Bilat Ileostomy noted, R side + for greenish watery stools and flatus while L side neg for any stools and flatus- ileostomy care completed.  Pt VSS and afebrile. Frequent reorientation provided. Pericare completed. Pending placement. Safety and fall precaution maintained. Hourly rounding performed.

## 2022-04-09 NOTE — Progress Notes
[facility]	 [facility]	Medicine Progress NoteAttending Provider: Donald Siva Berkley Harvey, MDPCP: Pcp, Does Not Have A NoneHospital Day: 34 Patient examined by me: 4/19/2023Subjective: CC: Fever, unspecified fever cause Interim History: Patient seen and examined at bedside. Patient frustrated about not having any progression of T19/STR plan. C/o pruritus (Sarna ordered)--no rash reported. No reported slight BLE discomfort. POC reviewed.Overnight Events: NoneReview of Systems Constitutional: Negative for chills. Respiratory: Negative for shortness of breath.  Cardiovascular: Negative for chest pain and palpitations. Gastrointestinal: Negative for abdominal pain, nausea and vomiting. Review of Allergies/Meds/Hx: I have reviewed the patient's: allergies, current scheduled medications, current infusions, current prn medications, past medical history, past surgical history, family history, social history and prior to admission medications.Scheduled Meds:Current Facility-Administered Medications Medication Dose Route Frequency Provider Last Rate Last Admin ? Banatrol Plus oral powder packet  1 packet Per G Tube BID Charlett Blake, MD   1 packet at 04/09/22 0920 ? camphor-menthoL (SARNA) lotion   Topical (Top) BID Kaida Games, Berkley Harvey, MD     ? Elemental iron 60 mg (300 mg Ferrous sulfate) / 5 mL oral liquid 45 mg  45 mg Per G Tube BID Peter Congo, Adil, MD   45 mg at 04/09/22 0920 ? enoxaparin (LOVENOX) injection 80 mg  80 mg Subcutaneous Q12H Chaar, Abdelkader, MD   80 mg at 04/09/22 1610 ? famotidine (PEPCID) suspension (8 mg/mL) 20 mg  20 mg Per G Tube Daily Bhutta, Adil, MD   20 mg at 04/09/22 0920 ? folic acid (FOLVITE) tablet 1 mg  1 mg Per G Tube Daily Peter Congo, Adil, MD   1 mg at 04/09/22 9604 ? gabapentin (NEURONTIN) capsule 100 mg  100 mg Oral Nightly Kang-Giaimo, Devona Konig, MD   100 mg at 04/08/22 2125 ? lidocaine (LIDODERM) 5 % 1 patch  1 patch Transdermal Q24H Simeon Craft, MD   1 patch at 04/08/22 2124 ? niacin Immediate Release tablet 100 mg  100 mg Per G Tube Nightly Bhutta, Adil, MD   100 mg at 04/08/22 2125 ? nystatin (MYCOSTATIN) powder 1 Application  1 Application Topical (Top) BID Henriette Combs, MD   1 Application at 04/08/22 2145 ? pyridoxine (vitamin B6) (B-6) tablet 100 mg  100 mg Per G Tube Daily Peter Congo, Adil, MD   100 mg at 04/09/22 0921 ? [Held by provider] rosuvastatin (CRESTOR) tablet 10 mg  10 mg Per G Tube Nightly Dorisann Frames, MD   10 mg at 02/14/22 2245 ? sodium chloride 0.9 % flush 3 mL  3 mL IV Push Q8H Dorisann Frames, MD   3 mL at 04/09/22 0519 ? thiamine (VITAMIN B1) tablet 100 mg  100 mg Per G Tube BID Peter Congo, Adil, MD   100 mg at 04/09/22 0921 Continuous Infusions:PRN Meds:.acetaminophen, dextrose (GLUCOSE) oral liquid 15 g **OR** fruit juice **OR** skim milk, dextrose (GLUCOSE) oral liquid 30 g **OR** fruit juice, dextrose injection, dextrose injection, gabapentin, glucagon, iohexol **AND** [COMPLETED] XR Abdomen AP, melatonin, ondansetron **OR** ondansetron (ZOFRAN) IV Push, sodium chlorideCurrent Facility-Administered Medications Medication Dose Route Frequency Last Rate ? acetaminophen  650 mg Per G Tube Q6H PRN   ? banana flakes-t-galactooligos.  1 packet Per G Tube BID   ? camphor-menthoL   Topical (Top) BID   ? dextrose (GLUCOSE) oral liquid 15 g  15 g Oral Q15 MIN PRN    Or ? fruit juice  120 mL Oral Q15 MIN PRN    Or ? skim milk  240 mL Oral Q15  MIN PRN   ? dextrose (GLUCOSE) oral liquid 30 g  30 g Oral Q15 MIN PRN    Or ? fruit juice  240 mL Oral Q15 MIN PRN   ? dextrose injection  12.5 g Intravenous Q15 MIN PRN   ? dextrose injection  25 g Intravenous Q15 MIN PRN   ? Elemental iron  45 mg Per G Tube BID   ? enoxaparin (LOVENOX) treatment  80 mg Subcutaneous Q12H   ? famotidine  20 mg Per G Tube Daily   ? folic acid  1 mg Per G Tube Daily   ? gabapentin  100 mg Oral Nightly   ? gabapentin  100 mg Oral BID PRN   ? glucagon  1 mg Intramuscular Once PRN   ? iohexol  20 mL Per G Tube ONCE PRN   ? Lidocaine Patch  1 patch Transdermal Q24H   ? melatonin  3 mg Per G Tube Nightly PRN   ? niacin  100 mg Per G Tube Nightly   ? nystatin  1 Application Topical (Top) BID   ? ondansetron  4 mg Oral Q6H PRN    Or ? ondansetron (ZOFRAN) IV Push  4 mg IV Push Q6H PRN   ? pyridoxine (vitamin B6)  100 mg Per G Tube Daily   ? [Held by provider] rosuvastatin  10 mg Per G Tube Nightly   ? sodium chloride  3 mL IV Push Q8H   ? sodium chloride  3 mL IV Push PRN for Line Care   ? thiamine  100 mg Per G Tube BID   Objective: Vitals: Patient Vitals for the past 24 hrs: Temp Temp src Pulse BP NIBP MAP (Monitor/Manual Entry) NIBP MAP (mmHg) (calculated/READ ONLY) Resp SpO2 04/09/22 1222 97.4 ?F (36.3 ?C) Oral (!) 102 107/69 82 82 18 97 % 04/09/22 0713 98.5 ?F (36.9 ?C) Oral 82 90/65 73 73 18 97 % 04/09/22 0429 98.1 ?F (36.7 ?C) Oral 83 115/72 -- 86 18 96 % 04/09/22 0029 97.8 ?F (36.6 ?C) Axillary 77 105/67 80 80 18 98 % 04/08/22 2024 97.7 ?F (36.5 ?C) Oral (!) 91 129/83 99 99 18 98 % 04/08/22 1648 97.7 ?F (36.5 ?C) Oral (!) 92 115/82 93 93 18 98 % Physical Exam:  Constitutional: oriented to person, and no acute distress.Marland KitchenHEENT: Head: NCAT. Eyes: EOMI. PERRL. Neck: Neck supple. Cardiovascular: RRRS1S2.  No murmursPulmonary/Chest: BS +. No crackles. No wheezes.Abdominal: Soft. BS +. Non-tender, Non-distended. Ileostomy-no blood. PEG site c/d/i.GU: PurewickMusculoskeletal: Normal ROM. Neurological: alert and oriented to person. Non focal--BLE weakness & slight drop foot.Extremities: BLE + edema. Skin: No rashesLabs:Recent Labs   04/18/230852 NA 139 K 4.2 CL 105 CO2 20 BUN 14 CREATININE 0.30* Recent Labs   04/18/230852 WBC 4.8 HGB 11.0* HCT 36.30 PLT 256 Recent Labs   04/18/230852 ALT 20 AST 26 BILITOT 0.2 Micro: Covid negativeProcedures:None IV Access: PIVDiagnostics:No new radiology.No results found for this or any previous visit.ECG/Tele Events: I have reviewed the patient's ECG as resulted in the EMR.Assessment: Assessment: 66 y.o. female with PMH of RA, recent prolonged hospitalization for perforated diverticulitis (s/p ex Lap/R hemicolectomy with end ileostomy). Admitted 2/24 for LTAC placement and persistent encephalopathy. Found to be febrile without clear infectious source found, fevers resolved. Extensive work-up of encephalopathy and leg weakness unremarkable. Course c/b elevated LFTs which resolved, likely 2/2 DILI from ceftriaxone or Zosyn. Stable and awaiting T19/LTC placement, but with ongoing significant hypercalcemia.?Mental status appears improved.Plan: Hx Recurrent Fever/R-foot Cellulitis:-transient fever resolved--last fever  04/05/22, no leukocytosis-PCT decreased 5.95--> 2.08-No clear source identified, possibly 2/2 DVT vs autoimmune?-BCx-04/02/22--NGTD-S/p CTX 2/21 and Zosyn 2/21-2/25-CXR-no acute pathology-prior UA-benign-completed ABX-Oxacillin-consulted Podiatry--appreciated recs -trend fever/CBC prn-Tylenol prn?Hypercalcemia:-Acute on?chronic--resolved-PTH normal (28); work-up including PTH-rP (9), vitamin D testing, TSH, random cortisol unrevealing-Possibly d/t immobility or primary hyperparathyroidism; sent additional PTH testing with mineral metabolism level (send-out test--pending)-Endocrine consulted -s/p Zolendronic acid 4 mg X1 (3/21)-fasting CTx (Collagen Type-1 Telopeptide)-elevated?Acute encephalopathy:LE weakness:-Unclear etiology, possibly prolonged delirium iso hospitalization from Nov 2022 - currentMRI brain, MRI total spine unremarkable-LP unremarkable-Paraneoplastic and autoimmune encephalopathy panels negative-Overall somewhat improved cognitive status during admission, remains alert and oriented x 1-2-Neurology rec'd EMG to complete work-up of LE weakness but pt declined; Neurology does not recommend any further testing and signed off 02/26/22-continue with Gabapentin 100 mg QHS -BLE US-no DVT?Perforated diverticulum: -s/p R hemicolectomy, end ileostomy/LFT elevation-c/b fluid abscess requiring drainage during Nov 2022 admission in [place]. [place]-?. 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?on this admission?now [place] scan shows improved Peritoneal nodules?Per 3/30 notes, seems imaging was reviewed and that nodules were improving and thought to be resolution of continued debris.-Work-up showed elevated IgG, smooth muscle antibodies c/f possible autoimmune hepatitisInitial concern that imaging showed peritoneal nodules ?carcinomatosis-On review of imaging, nodules improving, more likely to be continued resolution of debris from perforation-Liver tests resolved without intervention, so most likely DILI from antibiotics (ceftriaxone and Zosyn)?R lateral foot and L medial eminence deep tissue injuries:-Stable-Xray feet with degenerative changes but no acute findings-?Podiatry following - no dressing requirements, WBAT to BLE.No?indication for podiatric surgical intervention at this time. ?Patient may follow-up with Dr. Gazes within 3-4 weeks of discharge. -?No need for abx at this time ?Hx Bilateral DVT:- Apixaban 5mg  BID-HELD-c/w Lovenox 80 mg q12H?Elevated LFC ratio:Iron deficiency anemia:-Hematology consult &  signed off - elevated FLC ratio felt to be reactive, plasma cell disorder unlikely ?Chronic conditions:-Rheumatoid Arthritis - has been off of MTX since November/acute illness, rheumatology signing off current ams/weakness not felt to be related to her RA, follow up outpatient -Bilateral LE DVT - ?provoked from recent surgery, cont lovenox, can resume apixaban at discharge?Dysphagia:-resolved-s/p  TF + FWF-SLP consulted--for re-evluation-aspiration precautionsGOC and Capacity:- during hospitalization November 2022 with perforated diverticulum, deemed at this time not to have capacity. PEG was ultimately placed and TFs were initiated. She also cleared for non modified regular diet.FEN: diet, K>4, Mg>2 Lines: PIV Tubes: NoneMobility: pendingPT/OT: --> LTACDisposition: Awaiting T19 and LTAC?Medication Reconciliation: In ProgressCommunication: pendingSigned:Alexie Lanni Vivi Martens, MD, MSc  Communication via [facility] Hospitalist Service4/19/2023, 1:14 PMTime: >35  minutes : time spent obtaining history from patient or surrogate, review of medical records/prior chart documentation , examining/evaluating patient, ordering or performing treatments/interventions; ordering/reviewing laboratory studies, radiographic studies, pulse oximetry; re-evaluation of patient's condition and development of treatment plan, discussions with consultants, evaluation of patient's response to treatment. With 50% of time spent in care follow-up/care coordination, plan of care discussion with consulting specialities, and with the patient and/or patient's family/HCPOA/Surrogate Code Status Full Code DVT Prophylaxis  Lovenox Sc  ADMIT MED REC In Progress PCP Pcp, Does Not Have A None Emergency contact Extended Emergency Contact InformationPrimary Emergency Contact: McCulley,ReneeHome Phone: [phone] Disposition TBD

## 2022-04-09 NOTE — Plan of Care
Plan of Care Overview/ Patient Status    0700 - 1900Patient alert and oriented X 3 with periods of confusion. Vital signs stable, room air. IV to left arm patent and intact. Tube feed with tray diet. Lung sounds clear, bowel sounds present. Incontinent of bowel and bladder, purewick in place connected to suction. Assist X 2 in bed. 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 Left ileostomy site without drainage, right ileostomy site with watery, dark green stool and flatus. Appliance to right side observed leaking, appliance changed; patient tolerated without discomfort. Patient reported pain to left ankle, pain management provided. Bolus feeds administered. Patient repositioned Q 2 hours. Problem: Adult Inpatient Plan of CareGoal: Plan of Care Review4/19/2023 1651 by Yetta Barre, RNFlowsheets (Taken 04/09/2022 1651)Progress: no changePlan of Care Reviewed With: patient4/19/2023 1650 by Yetta Barre, RNOutcome: Interventions implemented as appropriate Problem: Adult Inpatient Plan of CareGoal: Patient-Specific Goal (Individualized)04/09/2022 1651 by Yetta Barre, RNFlowsheets (Taken 04/09/2022 0920)What Anxieties, Fears, Concerns or Questions Do You Have About Your Care?: Pain to bilateral ankles.Individualized Preferences and Care Needs: Per POC.Patient Centered Long Term Goal: Safe D/C.Patient Centered Daily Goal: Comfort.04/09/2022 1650 by Yetta Barre, RNOutcome: Interventions implemented as appropriate

## 2022-04-10 MED ORDER — TRAMADOL (ULTRAM) 25 MG HALFTAB
25 mg | Freq: Two times a day (BID) | ORAL | Status: DC | PRN
Start: 2022-04-10 — End: 2022-05-01
  Administered 2022-04-11 – 2022-05-01 (×17): 25 mg via ORAL

## 2022-04-10 MED ORDER — TRAMADOL (ULTRAM) 25 MG HALFTAB
25 mg | Freq: Once | ORAL | Status: CP
Start: 2022-04-10 — End: ?
  Administered 2022-04-10: 17:00:00 25 mg via ORAL

## 2022-04-10 NOTE — Plan of Care
Plan of Care Overview/ Patient Status    Pt VSS and on RA with no respi distress and or CP noted. R Ileostomy leaking last night, changed per order- site currently CDI with greenish watery stool noted. L Ileostomy changed as well, still no output. GT site with some SS drainage, cleansed with NS and covered with new split gauze. Pt tolerated all procedure well. TF completed this shift with no residual noted, aspiration precaution maintained. Voiding well overnight via purewick, peri area cleansed with soap and water, purewick changed. Lidocaine patch to L ankle for pain, BLE ROM exercises encouraged and performed.  Repositioned in bed frequently, hourly rounding performed and safety maintained. Problem: Adult Inpatient Plan of CareGoal: Optimal Comfort and WellbeingOutcome: Interventions implemented as appropriate Problem: Aspiration (Enteral Nutrition)Goal: Absence of Aspiration Signs and SymptomsOutcome: Interventions implemented as appropriate Problem: Pain AcuteGoal: Acceptable Pain Control and Functional AbilityOutcome: Interventions implemented as appropriate

## 2022-04-10 NOTE — Plan of Care
Plan of Care Overview/ Patient Status    [name]&Ox2 t, vss,  Complains of BLLE pain, administered tramadol with good effect.   PO intake poor. TF administered as ordered. Tolerating well. Incontinent of urine , care provided. Illeostomy output 100 ml this shift.  Podus boots to BL feet. At times requests to take them off. Heels off loaded on pillows. Oral care provided as ordered. Wound care performed. T&P q 2hrs as tolerated. On a specialty bed. Safety maintained, bed alarm on, call bell in reach.

## 2022-04-10 NOTE — Progress Notes
[facility]	 [facility]	Medicine Progress NoteAttending Provider: Donald Siva Berkley Harvey, MDPCP: Pcp, Does Not Have A NoneHospital Day: 64 Patient examined by me: 4/20/2023Subjective: CC: Fever, unspecified fever cause Interim History: Patient seen and examined at bedside. Patient feeling better today. Reported slight BLE discomfort--good response to Tramadol yesterday. POC reviewed.Overnight Events: NoneReview of Systems Constitutional: Negative for chills. Respiratory: Negative for shortness of breath.  Cardiovascular: Negative for chest pain and palpitations. Gastrointestinal: Negative for abdominal pain, nausea and vomiting. Review of Allergies/Meds/Hx: I have reviewed the patient's: allergies, current scheduled medications, current infusions, current prn medications, past medical history, past surgical history, family history, social history and prior to admission medications.Scheduled Meds:Current Facility-Administered Medications Medication Dose Route Frequency Provider Last Rate Last Admin ? Banatrol Plus oral powder packet  1 packet Per G Tube BID Charlett Blake, MD   1 packet at 04/10/22 6045 ? camphor-menthoL (SARNA) lotion   Topical (Top) BID Riyansh Gerstner, Berkley Harvey, MD   Given at 04/10/22 [phone] ? Elemental iron 60 mg (300 mg Ferrous sulfate) / 5 mL oral liquid 45 mg  45 mg Per G Tube BID Peter Congo, Adil, MD   45 mg at 04/10/22 0902 ? enoxaparin (LOVENOX) injection 80 mg  80 mg Subcutaneous Q12H Chaar, Abdelkader, MD   80 mg at 04/10/22 0902 ? famotidine (PEPCID) suspension (8 mg/mL) 20 mg  20 mg Per G Tube Daily Peter Congo, Adil, MD   20 mg at 04/10/22 0902 ? folic acid (FOLVITE) tablet 1 mg  1 mg Per G Tube Daily Bhutta, Adil, MD   1 mg at 04/10/22 1191 ? gabapentin (NEURONTIN) capsule 100 mg  100 mg Oral Nightly Kang-Giaimo, Devona Konig, MD   100 mg at 04/09/22 2220 ? lidocaine (LIDODERM) 5 % 1 patch  1 patch Transdermal Q24H Simeon Craft, MD   1 patch at 04/09/22 2220 ? melatonin tablet 3 mg  3 mg Per G Tube Nightly Etienne Mowers, Berkley Harvey, MD   3 mg at 04/09/22 2220 ? niacin Immediate Release tablet 100 mg  100 mg Per G Tube Nightly Bhutta, Adil, MD   100 mg at 04/09/22 2220 ? nystatin (MYCOSTATIN) powder 1 Application  1 Application Topical (Top) BID Henriette Combs, MD   1 Application at 04/10/22 [phone] ? pyridoxine (vitamin B6) (B-6) tablet 100 mg  100 mg Per G Tube Daily Peter Congo, Adil, MD   100 mg at 04/10/22 0902 ? [Held by provider] rosuvastatin (CRESTOR) tablet 10 mg  10 mg Per G Tube Nightly Dorisann Frames, MD   10 mg at 02/14/22 2245 ? sodium chloride 0.9 % flush 3 mL  3 mL IV Push Q8H Dorisann Frames, MD   3 mL at 04/10/22 9562 ? thiamine (VITAMIN B1) tablet 100 mg  100 mg Per G Tube BID Peter Congo, Adil, MD   100 mg at 04/10/22 0902 ? traMADol (ULTRAM) tablet 25 mg  25 mg Oral Once Anea Fodera, Berkley Harvey, MD     Continuous Infusions:PRN Meds:.acetaminophen, dextrose (GLUCOSE) oral liquid 15 g **OR** fruit juice **OR** skim milk, dextrose (GLUCOSE) oral liquid 30 g **OR** fruit juice, dextrose injection, dextrose injection, gabapentin, glucagon, ondansetron **OR** ondansetron (ZOFRAN) IV Push, sodium chloride, traMADoLCurrent Facility-Administered Medications Medication Dose Route Frequency Last Rate ? acetaminophen  650 mg Per G Tube Q6H PRN   ? banana flakes-t-galactooligos.  1 packet Per G Tube BID   ? camphor-menthoL   Topical (Top) BID   ? dextrose (GLUCOSE) oral liquid 15 g  15 g Oral  Q15 MIN PRN    Or ? fruit juice  120 mL Oral Q15 MIN PRN    Or ? skim milk  240 mL Oral Q15 MIN PRN   ? dextrose (GLUCOSE) oral liquid 30 g  30 g Oral Q15 MIN PRN    Or ? fruit juice  240 mL Oral Q15 MIN PRN   ? dextrose injection  12.5 g Intravenous Q15 MIN PRN   ? dextrose injection  25 g Intravenous Q15 MIN PRN   ? Elemental iron  45 mg Per G Tube BID   ? enoxaparin (LOVENOX) treatment  80 mg Subcutaneous Q12H   ? famotidine  20 mg Per G Tube Daily   ? folic acid  1 mg Per G Tube Daily   ? gabapentin  100 mg Oral Nightly   ? gabapentin  100 mg Oral BID PRN   ? glucagon  1 mg Intramuscular Once PRN   ? Lidocaine Patch  1 patch Transdermal Q24H   ? melatonin  3 mg Per G Tube Nightly   ? niacin  100 mg Per G Tube Nightly   ? nystatin  1 Application Topical (Top) BID   ? ondansetron  4 mg Oral Q6H PRN    Or ? ondansetron (ZOFRAN) IV Push  4 mg IV Push Q6H PRN   ? pyridoxine (vitamin B6)  100 mg Per G Tube Daily   ? [Held by provider] rosuvastatin  10 mg Per G Tube Nightly   ? sodium chloride  3 mL IV Push Q8H   ? sodium chloride  3 mL IV Push PRN for Line Care   ? thiamine  100 mg Per G Tube BID   ? traMADoL  25 mg Oral Once   ? traMADoL  25 mg Oral Q12H PRN   Objective: Vitals: Patient Vitals for the past 24 hrs: Temp Temp src Pulse BP NIBP MAP (Monitor/Manual Entry) NIBP MAP (mmHg) (calculated/READ ONLY) Resp SpO2 04/10/22 1226 97.4 ?F (36.3 ?C) Oral 78 109/74 85 85 18 98 % 04/10/22 0853 97.5 ?F (36.4 ?C) Oral 81 126/76 -- 93 18 97 % 04/10/22 0401 98.5 ?F (36.9 ?C) Oral 85 120/79 92 92 18 97 % 04/09/22 2351 97.9 ?F (36.6 ?C) Oral 88 121/76 91 91 17 98 % 04/09/22 2022 97.5 ?F (36.4 ?C) Oral (!) 96 121/78 92 92 (!) 22 96 % 04/09/22 1710 99.2 ?F (37.3 ?C) Axillary (!) 91 118/77 91 91 18 100 % Physical Exam:  Constitutional: oriented to person, and no acute distress.Marland KitchenHEENT: Head: NCAT. Eyes: EOMI. PERRL. Neck: Neck supple. Cardiovascular: RRRS1S2.  No murmursPulmonary/Chest: BS +. No crackles. No wheezes.Abdominal: Soft. BS +. Non-tender, Non-distended. Ileostomy-no blood. PEG site c/d/i.GU: PurewickMusculoskeletal: Normal ROM. Neurological: alert and oriented to person. Non focal--BLE weakness & drop foot.Extremities: BLE + edema. Skin: No rashesLabs:Recent Labs 04/18/230852 NA 139 K 4.2 CL 105 CO2 20 BUN 14 CREATININE 0.30* Recent Labs   04/18/230852 WBC 4.8 HGB 11.0* HCT 36.30 PLT 256 Recent Labs   04/18/230852 ALT 20 AST 26 BILITOT 0.2 Micro: Covid negativeProcedures:None IV Access: PIVDiagnostics:No new radiology.No results found for this or any previous visit.ECG/Tele Events: I have reviewed the patient's ECG as resulted in the EMR.Assessment: Assessment: 66 y.o. female with PMH of RA, recent prolonged hospitalization for perforated diverticulitis (s/p ex Lap/R hemicolectomy with end ileostomy). Admitted 2/24 for LTAC placement and persistent encephalopathy. Found to be febrile without clear infectious source found, fevers resolved. Extensive work-up of encephalopathy and leg weakness unremarkable. Course  c/b elevated LFTs which resolved, likely 2/2 DILI from ceftriaxone or Zosyn. Stable and awaiting T19/LTC placement, but with ongoing significant hypercalcemia.?Mental status appears improved.Plan: Hx Recurrent Fever/R-foot Cellulitis:-transient fever resolved--last fever 04/05/22, no leukocytosis-PCT decreased 5.95--> 2.08-No clear source identified, possibly 2/2 DVT vs autoimmune?-BCx-04/02/22--NGTD-S/p CTX 2/21 and Zosyn 2/21-2/25-CXR-no acute pathology-prior UA-benign-completed ABX-Oxacillin-consulted Podiatry--appreciated recs -trend fever/CBC prn-Tylenol prn?Hypercalcemia:-Acute on?chronic--resolved-PTH normal (28); work-up including PTH-rP (9), vitamin D testing, TSH, random cortisol unrevealing-Possibly d/t immobility or primary hyperparathyroidism; sent additional PTH testing with mineral metabolism level (send-out test--pending)-Endocrine consulted -s/p Zolendronic acid 4 mg X1 (3/21)-fasting CTx (Collagen Type-1 Telopeptide)-elevated?Acute encephalopathy:LE weakness:-Unclear etiology, possibly prolonged delirium iso hospitalization from Nov 2022 - currentMRI brain, MRI total spine unremarkable-LP unremarkable-Paraneoplastic and autoimmune encephalopathy panels negative-Overall somewhat improved cognitive status during admission, remains alert and oriented x 1-2-Neurology rec'd EMG to complete work-up of LE weakness but pt declined; Neurology does not recommend any further testing and signed off 02/26/22-continue with Gabapentin 100 mg QHS -BLE US-no DVT?Perforated diverticulum: -s/p R hemicolectomy, end ileostomy/LFT elevation-c/b fluid abscess requiring drainage during Nov 2022 admission in [place]. [place]-?. 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?on this admission?now [place] scan shows improved Peritoneal nodules?Per 3/30 notes, seems imaging was reviewed and that nodules were improving and thought to be resolution of continued debris.-Work-up showed elevated IgG, smooth muscle antibodies c/f possible autoimmune hepatitisInitial concern that imaging showed peritoneal nodules ?carcinomatosis-On review of imaging, nodules improving, more likely to be continued resolution of debris from perforation-Liver tests resolved without intervention, so most likely DILI from antibiotics (ceftriaxone and Zosyn)?R lateral foot and L medial eminence deep tissue injuries:-Stable-Xray feet with degenerative changes but no acute findings-?Podiatry following - no dressing requirements, WBAT to BLE.No?indication for podiatric surgical intervention at this time. ?Patient may follow-up with Dr. Gazes within 3-4 weeks of discharge. -?No need for abx at this time --BLE US-no DVT-tramadol 25 mg q12h, prn for pain?Hx Bilateral DVT:- Apixaban 5mg  BID-HELD-c/w Lovenox 80 mg q12H?Elevated LFC ratio:Iron deficiency anemia:-Hematology consult &  signed off - elevated FLC ratio felt to be reactive, plasma cell disorder unlikely ?Chronic conditions:-Rheumatoid Arthritis - has been off of MTX since November/acute illness, rheumatology signing off current ams/weakness not felt to be related to her RA, follow up outpatient -Bilateral LE DVT - ?provoked from recent surgery, cont lovenox, can resume apixaban at discharge?Dysphagia:-resolved-s/p  TF + FWF-SLP consulted--for re-evluation-aspiration precautionsGOC and Capacity:- during hospitalization November 2022 with perforated diverticulum, deemed at this time not to have capacity. PEG was ultimately placed and TFs were initiated. She also cleared for non modified regular diet.FEN: diet, K>4, Mg>2 Lines: PIV Tubes: NoneMobility: pendingPT/OT: --> LTACDisposition: Awaiting T19 and LTAC?Medication Reconciliation: In ProgressCommunication: pendingSigned:Katiejo Gilroy Vivi Martens, MD, MSc  Communication via [facility] Hospitalist Service4/20/2023, 1:14 PMTime: >35  minutes : time spent obtaining history from patient or surrogate, review of medical records/prior chart documentation , examining/evaluating patient, ordering or performing treatments/interventions; ordering/reviewing laboratory studies, radiographic studies, pulse oximetry; re-evaluation of patient's condition and development of treatment plan, discussions with consultants, evaluation of patient's response to treatment. With 50% of time spent in care follow-up/care coordination, plan of care discussion with consulting specialities, and with the patient and/or patient's family/HCPOA/Surrogate Code Status Full Code DVT Prophylaxis  Lovenox Sc  ADMIT MED REC In Progress PCP Pcp, Does Not Have A None Emergency contact Extended Emergency Contact InformationPrimary Emergency Contact: McCulley,ReneeHome Phone: [phone] Disposition TBD

## 2022-04-10 NOTE — Plan of Care
Danielle [name]					Location: EP 97/9822-A65 y.o., female				Attending: Thurnell Lose, MD	Admit Date: 02/11/2022			[id number] LOS: 58 days FOLLOW-UP NUTRITION ASSESSMENT Dietary Orders (From admission, onward)     Start     Ordered  04/03/22 1538  Diet Tube Feed With Tray  (Diet Tube Feed Panel)  DIET EFFECTIVE NOW      Comments: JEVITY 1.5 @ 1.5 carton bolus BID (@9am ,1pm) and 1 carton BID (5pm, 9pm) to total 5 cartons dailyImportant:  Unrestricted diet will be sent unless otherwise specified below. Question Answer Comment Diet Selection? Regular  Safety Tray? (Suicide/Homicide/Police Custody) Safety Tray - No  Adult Tube Feed Formulas: Jevity 1.5 (YNH,SRC)  Feeding Route: G-Tube  Continuous Rate ml/hr: Other(see comments)  Initiate Nutrition Management Protocol (Yes/No?) Yes - Initiate Protocol    04/03/22 1539    No Known AllergiesANTHROPOMETRICS:Height:???70Admit wt:?no recent wt to assess.?Current wt:?72.7 kg (4/19 , bed) BMI:?23??(based on?current wt)?Admit wt trends:?04/02/22 0829 74.6 kg Bed 03/31/22 0809 72.7 kg Bed 03/29/22 0825 73.8 kg Bed 03/28/22 0752 73 kg Bed 03/27/22 0739 73.2 kg Bed 03/26/22 0803 71.3 kg Bed 03/25/22 0826 71.5 kg Bed 03/22/22 0804 71 kg Bed 03/20/22 0812 72.6 kg Bed 03/19/22 0824 72.4 kg Bed ?Dosing Wt:?73?kg (based on?current wt trends and normal BMI)Additional wt information:Ht confirmed with pt. Pt last known wt was 98.6 kg (12/13/2018).?Wt loss?confirmed with admit wt?as pt no longer weighs?98 kg.?Time line for wt loss in not clear and pt cannot report.?1+ trace edema b/l LE per flowsheets. Noted wt loss 3/29-4/1, suspect d/t inadequate oral intake and stopping tube feed. Wt back to adm weight since TF restarted.?Wt:03/20/22 ???72.6 kg????????????Bed03/29/23 ???72.4 kg????????????Bed12/23/19????98.6 kg?ESTIMATED NUTRITION REQUIREMENTS:Kcal/day:?1825??????????????(25 ?kcal/kg)Protein/day:?73-87 ?????????????(1-1.2gm/kg)Fluids/day:?1825 mL/d?(23mL/kcal) ONLY as medical and fluid status allows and only per team discretion. Needs based on:?Dosing Wt (73?kg), maintenance.??NUTRITION ASSESSMENT:?PT EMR reviewed for follow up assessment. Pt meds and labs reviewed. Pt noted to have continue tolerance to tube feeds. ?Visited pt, alert to self only. Pt receiving non-select meals.Assisted with breakfast setup, Pt continues to eat minimally with assistance. No need to adjust down tube feeds at this time. Pt is meeting needs between TF and PO. However, Pt continues to require TF to meet the majority nutrition needs. ?Labs-  Na 139; BUN/Cr- 46.7.  Cultural/Religious/Ethnic Needs:?Unknown??Food Allergies: NKFA??NUTRITION DIAGNOSIS:Severe malnutrition r/t depletion aeb moderate muscle and fat depletion.- continues??INTERVENTIONS/RECOMMENDATIONS:?Meals and Snacks:-?Continue Regular diet- would consider removing additional vitamin and mineral supplementations in setting of receiving full goal rate supportive feeds. Goal: po intake for comfort given support provided by goal volume bolus feeds, continue?Nutrition supplement:No need for oral supplements ?Enteral Nutrition:?	Consider JEVITY 1.5?@ 1.5 carton bolus BID (@9am ,1pm) and 1 carton BID (5pm, 9pm) to total 5 cartons daily-	Provides: 1.185L, 1778kcal, 76gm protein, free water-	Additional free water per team. Add'l 877 mL provides 1 mL/kcal fedD/w covering provider Goal: ?-	Patient will receive >90% of goal volume TF/ TPN prior to next nutrition assessment- met will continue? ??Discharge Planning and Transfer of Nutrition Care:??Nutrition related discharge needs still being determined at this time, will continue to follow ??MONITORING / EVALUATION:?Food/Nutrition-Related History:Food and Nutrient Intake?Anthropometric Measurements:Weight ?Biochemical Data, Medical Tests and Procedures:Electrolyte and renal profileGlucose/endocrine profile?Nutrition-Focus Physical FindingsOverall findingsElectronically signed by Sherrilee Gilles, RD, April 20, 2023[facility]- [phone] Please note: Nutrition is a consult only service on weekends and holidays.  Please enter a consult in EPIC if assistance is needed on a weekend or holiday or via [facility] [phone] to contact the covering RD.

## 2022-04-11 LAB — BLOOD CULTURE   (BH GH L LMW YH)
BKR BLOOD CULTURE: NO GROWTH
BKR BLOOD CULTURE: NO GROWTH

## 2022-04-11 MED ORDER — POLYETHYLENE GLYCOL 3350 17 GRAM ORAL POWDER PACKET
17 gram | Freq: Every day | GASTROSTOMY | Status: DC | PRN
Start: 2022-04-11 — End: 2022-04-19

## 2022-04-11 MED ORDER — APIXABAN 5 MG TABLET
5 mg | Freq: Two times a day (BID) | ORAL | Status: DC
Start: 2022-04-11 — End: 2022-04-11

## 2022-04-11 MED ORDER — APIXABAN 5 MG TABLET
5 mg | Freq: Two times a day (BID) | GASTROSTOMY | Status: DC
Start: 2022-04-11 — End: 2022-04-19
  Administered 2022-04-12 – 2022-04-19 (×16): 5 mg via GASTROSTOMY

## 2022-04-11 NOTE — Plan of Care
Plan of Care Overview/ Patient Status    Patient Vss, on room air. Ostomy in place, no drainage around site emptied x2 over night. Tube feeding and supplement given and tolerated @2100 . Patient verbalized pain after being repositioned  but declined pain medication after movement stopped.

## 2022-04-11 NOTE — Plan of Care
Inpatient Physical Therapy Progress Note IP Adult PT Eval/Treat - 04/11/22 1122    Date of Visit / Treatment  Date of Visit / Treatment 04/11/22   Note Type Progress Note   Progress Report Due 04/30/22   End Time 1122    General Information  Subjective It feels good.   General Observations Pt received supine in bed; on RA; NAD; ostomy; B podus boots; RN cleared   Precautions/Limitations Fall Precautions;Bed alarm;Chair alarm   Precautions/Limitations Comment Aspiration Precaution    Vital Signs and Orthostatic Vital Signs  Vital Signs Vital Signs Stable   Vital Signs Free text RA; NAD    Pain/Comfort  Pain Comment (Pre/Post Treatment Pain) No c/o pain pre/post session    Patient Coping  Observed Emotional State accepting;cooperative   Verbalized Emotional State acceptance    Cognition  Overall Cognitive Status Impaired   Orientation Level Oriented to person;Oriented to place;Oriented to time   Level of Consciousness alert;confused   Following Commands Follows one step commands with increased time;Follows one step commands with repetition   Personal Safety / Judgment Fall risk;Requires supervision with mobility   Attention Requires redirection   Cognition Comments Continues to demonstrate improved cognition    Range of Motion  Range of Motion Comments Decreased GH shld mobility, performed AAROM B shld    Manual Muscle Testing  Manual Muscle Testing Comments + Deconditioning    Skin Assessment  Skin Assessment See Nursing Documentation    Balance  Sitting Balance: Static  FAIR-      Contact Guard to maintain static position with no Assistive Device   Sitting Balance: Dynamic  POOR+   Moves through 1/2 range with minimal assist to right self   Balance Assist Device Hand held assist   Balance Skills Training Comment Ax1    Bed Mobility  Supine-to-Sit Independence/Assistance Level Maximum assist;Assist of 1 Supine-to-Sit Assist Device Head of bed elevated;Hand held assist;Draw pad   Sit-to-Supine Independence/Assistance Level Maximum assist;Assist of 1   Sit-to-Supine Assist Device Head of bed elevated;Hand held assist;Draw pad   Limitations decreased ability to use legs for bridging/pushing   Bed Mobility, Impairments ROM decreased;strength decreased;pain   Bed mobility was limited by: impaired motor function;pain   Bed Mobility Comments Retropulsive w/ L lateral lean seated EOB, VCs and TCs to correct    Sit-Stand Transfer Training  Sit-Stand Transfer Comments Deferred d/t weakness and pain    Handoff Documentation  Handoff Patient in bed;Bed alarm;Patient instructed to call nursing for mobility;Discussed with nursing    Endurance  Endurance Comments Fair-    PT- AM-PAC - Basic Mobility Screen- How much help from another person do you currently need.....  Turning from your back to your side while in a a flat bed without using rails? 1 - Total - Requires total assistance or cannot do it at all.   Moving from lying on your back to sitting on the side of a flat bed without using bed rails? 1 - Total - Requires total assistance or cannot do it at all.   Moving to and from a bed to a chair (including a wheelchair)? 1 - Total - Requires total assistance or cannot do it at all.   Standing up from a chair using your arms(e.g., wheelchair or bedside chair)? 1 - Total - Requires total assistance or cannot do it at all.   To walk in a hospital room? 1 - Total - Requires total assistance or cannot do it at all.   Climbing  3-5 steps with a railing? 1 - Total - Requires total assistance or cannot do it at all.   AMPAC Mobility Score 6   TARGET Highest Level of Mobility Mobility Level 2, Turn self in bed/bed activity/dependent transfer    ACTUAL Highest Level of Mobility Mobility Level 3, Sit on edge of bed    Therapeutic Exercise  Therapeutic Exercise Comments AAROM to B shld and knee ext/flex    Neuromuscular Re-education  Neuromuscular Re-education Comments Seated postural control training    Therapeutic Functional Activity  Therapeutic Functional Activity Comments Bed mobility; Pt edu    Clinical Impression  Follow up Assessment Pt seen for PT f/u. Continues to show improved cognition. Required MaxAx1 for supine<>sit. Performed AAROM LAQ seated EOB and AAROM B shld. Retropulsive and L lateral lean seated edge of bed requiring VCs and HHA to correct. Will benefit from skilled services to address impaired functional ROM/strength, seated postural control, and activity tolerance. Current PT d/c rec is STR.   Criteria for Skilled Therapeutic Interventions Met yes    Patient/Family Stated Goals  Patient/Family Stated Goal(s) decrease pain;feel better    Frequency/Equipment Recommendations  PT Frequency 2x per week   What day of week is next treatment expected? Monday   4/24  PT/PTA completing this assessment RL   Equipment Needs During Admission/Treatment No device    PT Recommendations for Inpatient Admission  Activity/Level of Assist dangle edge of bed;assist of 2   lift oob   Planned Treatment / Interventions  Plan for Next Visit Progress as tol. Stand w/ Ax2-3   Training Treatment / Interventions Balance / Gait Training;Endurance Training;Functional Personnel officer / Interventions Patient Education / Training    PT Discharge Summary  Physical Therapy Disposition Recommendation Short Term Rehab     Shanon Payor PT, [place]: [phone]

## 2022-04-11 NOTE — Progress Notes
[facility]	 [facility]	Medicine Progress NoteAttending Provider: Donald Siva Berkley Harvey, MDPCP: Pcp, Does Not Have A NoneHospital Day: 89 Patient examined by me: 4/21/2023Subjective: CC: Fever, unspecified fever cause Interim History: Patient seen and examined at bedside. Patient feeling better today. Reported no BLE discomfort. POC reviewed.Overnight Events: NoneReview of Systems Constitutional: Negative for chills. Respiratory: Negative for shortness of breath.  Cardiovascular: Negative for chest pain and palpitations. Gastrointestinal: Negative for abdominal pain, nausea and vomiting. Review of Allergies/Meds/Hx: I have reviewed the patient's: allergies, current scheduled medications, current infusions, current prn medications, past medical history, past surgical history, family history, social history and prior to admission medications.Scheduled Meds:Current Facility-Administered Medications Medication Dose Route Frequency Provider Last Rate Last Admin ? Banatrol Plus oral powder packet  1 packet Per G Tube BID Charlett Blake, MD   1 packet at 04/11/22 [phone] ? camphor-menthoL (SARNA) lotion   Topical (Top) BID Jarmel Linhardt, Berkley Harvey, MD   Given at 04/10/22 2130 ? Elemental iron 60 mg (300 mg Ferrous sulfate) / 5 mL oral liquid 45 mg  45 mg Per G Tube BID Peter Congo, Adil, MD   45 mg at 04/11/22 0834 ? enoxaparin (LOVENOX) injection 80 mg  80 mg Subcutaneous Q12H Chaar, Abdelkader, MD   80 mg at 04/11/22 0834 ? famotidine (PEPCID) suspension (8 mg/mL) 20 mg  20 mg Per G Tube Daily Peter Congo, Adil, MD   20 mg at 04/11/22 0834 ? folic acid (FOLVITE) tablet 1 mg  1 mg Per G Tube Daily Peter Congo, Adil, MD   1 mg at 04/11/22 0834 ? gabapentin (NEURONTIN) capsule 100 mg  100 mg Oral Nightly Kang-Giaimo, Devona Konig, MD   100 mg at 04/10/22 2128 ? lidocaine (LIDODERM) 5 % 1 patch  1 patch Transdermal Q24H Simeon Craft, MD   1 patch at 04/10/22 2129 ? melatonin tablet 3 mg  3 mg Per G Tube Nightly Corean Yoshimura, Berkley Harvey, MD   3 mg at 04/10/22 2128 ? niacin Immediate Release tablet 100 mg  100 mg Per G Tube Nightly Bhutta, Adil, MD   100 mg at 04/10/22 2128 ? nystatin (MYCOSTATIN) powder 1 Application  1 Application Topical (Top) BID Henriette Combs, MD   1 Application at 04/11/22 [phone] ? pyridoxine (vitamin B6) (B-6) tablet 100 mg  100 mg Per G Tube Daily Peter Congo, Adil, MD   100 mg at 04/11/22 0834 ? [Held by provider] rosuvastatin (CRESTOR) tablet 10 mg  10 mg Per G Tube Nightly Dorisann Frames, MD   10 mg at 02/14/22 2245 ? sodium chloride 0.9 % flush 3 mL  3 mL IV Push Q8H Dorisann Frames, MD   3 mL at 04/10/22 2131 ? thiamine (VITAMIN B1) tablet 100 mg  100 mg Per G Tube BID Peter Congo, Adil, MD   100 mg at 04/11/22 0834 Continuous Infusions:PRN Meds:.acetaminophen, dextrose (GLUCOSE) oral liquid 15 g **OR** fruit juice **OR** skim milk, dextrose (GLUCOSE) oral liquid 30 g **OR** fruit juice, dextrose injection, dextrose injection, gabapentin, glucagon, ondansetron **OR** ondansetron (ZOFRAN) IV Push, sodium chloride, traMADoLCurrent Facility-Administered Medications Medication Dose Route Frequency Last Rate ? acetaminophen  650 mg Per G Tube Q6H PRN   ? banana flakes-t-galactooligos.  1 packet Per G Tube BID   ? camphor-menthoL   Topical (Top) BID   ? dextrose (GLUCOSE) oral liquid 15 g  15 g Oral Q15 MIN PRN    Or ? fruit juice  120 mL Oral Q15 MIN PRN    Or ? skim  milk  240 mL Oral Q15 MIN PRN   ? dextrose (GLUCOSE) oral liquid 30 g  30 g Oral Q15 MIN PRN    Or ? fruit juice  240 mL Oral Q15 MIN PRN   ? dextrose injection  12.5 g Intravenous Q15 MIN PRN   ? dextrose injection  25 g Intravenous Q15 MIN PRN   ? Elemental iron  45 mg Per G Tube BID   ? enoxaparin (LOVENOX) treatment  80 mg Subcutaneous Q12H   ? famotidine  20 mg Per G Tube Daily   ? folic acid  1 mg Per G Tube Daily   ? gabapentin  100 mg Oral Nightly   ? gabapentin  100 mg Oral BID PRN   ? glucagon  1 mg Intramuscular Once PRN   ? Lidocaine Patch  1 patch Transdermal Q24H   ? melatonin  3 mg Per G Tube Nightly   ? niacin  100 mg Per G Tube Nightly   ? nystatin  1 Application Topical (Top) BID   ? ondansetron  4 mg Oral Q6H PRN    Or ? ondansetron (ZOFRAN) IV Push  4 mg IV Push Q6H PRN   ? pyridoxine (vitamin B6)  100 mg Per G Tube Daily   ? [Held by provider] rosuvastatin  10 mg Per G Tube Nightly   ? sodium chloride  3 mL IV Push Q8H   ? sodium chloride  3 mL IV Push PRN for Line Care   ? thiamine  100 mg Per G Tube BID   ? traMADoL  25 mg Oral Q12H PRN   Objective: Vitals: Patient Vitals for the past 24 hrs: Temp Temp src Pulse BP NIBP MAP (Monitor/Manual Entry) NIBP MAP (mmHg) (calculated/READ ONLY) Resp SpO2 04/11/22 0804 98.7 ?F (37.1 ?C) Oral 75 111/64 80 80 18 98 % 04/11/22 0509 98.3 ?F (36.8 ?C) Oral 76 114/75 88 88 18 97 % 04/11/22 0055 99 ?F (37.2 ?C) Oral 90 113/74 87 87 18 97 % 04/10/22 2033 98.5 ?F (36.9 ?C) Oral (!) 96 122/79 -- 93 18 95 % 04/10/22 1527 98.1 ?F (36.7 ?C) Oral 89 118/81 93 93 18 100 % 04/10/22 1226 97.4 ?F (36.3 ?C) Oral 78 109/74 85 85 18 98 % Physical Exam:  Constitutional: oriented to person, and no acute distress.Marland KitchenHEENT: Head: NCAT. Eyes: EOMI. PERRL. Neck: Neck supple. Cardiovascular: RRRS1S2.  No murmursPulmonary/Chest: BS +. No crackles. No wheezes.Abdominal: Soft. BS +. Non-tender, Non-distended. Ileostomy-no blood. PEG site c/d/i.GU: PurewickMusculoskeletal: Normal ROM. Neurological: alert and oriented to person. Non focal--BLE weakness & drop foot.Extremities: BLE + edema. Skin: No rashesLabs:No results for input(s): NA, K, CL, CO2, BUN, CREATININE in the last 72 hours.No results for input(s): WBC, HGB, HCT, PLT in the last 72 hours.No results for input(s): ALT, AST, BILITOT in the last 72 hours.Invalid input(s): BILIDMicro: Covid negativeProcedures:None IV Access: PIVDiagnostics:No new radiology.No results found for this or any previous visit.ECG/Tele Events: I have reviewed the patient's ECG as resulted in the EMR.Assessment: Assessment: 66 y.o. female with PMH of RA, recent prolonged hospitalization for perforated diverticulitis (s/p ex Lap/R hemicolectomy with end ileostomy). Admitted 2/24 for LTAC placement and persistent encephalopathy. Found to be febrile without clear infectious source found, fevers resolved. Extensive work-up of encephalopathy and leg weakness unremarkable. Course c/b elevated LFTs which resolved, likely 2/2 DILI from ceftriaxone or Zosyn. Stable and awaiting T19/LTC placement, but with ongoing significant hypercalcemia.?Mental status appears improved.Plan: Hx Recurrent Fever/R-foot Cellulitis:-transient fever resolved--last fever 04/05/22, no  leukocytosis-PCT decreased 5.95--> 2.08-No clear source identified, possibly 2/2 DVT vs autoimmune?-BCx-04/02/22--NGTD-S/p CTX 2/21 and Zosyn 2/21-2/25-CXR-no acute pathology-prior UA-benign-completed ABX-Oxacillin-consulted Podiatry--appreciated recs -trend fever/CBC prn-Tylenol prn?Hypercalcemia:-Acute on?chronic--resolved-PTH normal (28); work-up including PTH-rP (9), vitamin D testing, TSH, random cortisol unrevealing-Possibly d/t immobility or primary hyperparathyroidism; sent additional PTH testing with mineral metabolism level (send-out test--pending)-Endocrine consulted -s/p Zolendronic acid 4 mg X1 (3/21)-fasting CTx (Collagen Type-1 Telopeptide)-elevated?Acute encephalopathy:LE weakness:-Unclear etiology, possibly prolonged delirium iso hospitalization from Nov 2022 - currentMRI brain, MRI total spine unremarkable-LP unremarkable-Paraneoplastic and autoimmune encephalopathy panels negative-Overall somewhat improved cognitive status during admission, remains alert and oriented x 1-2-Neurology rec'd EMG to complete work-up of LE weakness but pt declined; Neurology does not recommend any further testing and signed off 02/26/22-continue with Gabapentin 100 mg QHS -BLE US-no DVT?Perforated diverticulum: -s/p R hemicolectomy, end ileostomy/LFT elevation-c/b fluid abscess requiring drainage during Nov 2022 admission in [place]. Enigma-?. 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?on this admission?now Crane scan shows improved Peritoneal nodules?Per 3/30 notes, seems imaging was reviewed and that nodules were improving and thought to be resolution of continued debris.-Work-up showed elevated IgG, smooth muscle antibodies c/f possible autoimmune hepatitisInitial concern that imaging showed peritoneal nodules ?carcinomatosis-On review of imaging, nodules improving, more likely to be continued resolution of debris from perforation-Liver tests resolved without intervention, so most likely DILI from antibiotics (ceftriaxone and Zosyn)?R lateral foot and L medial eminence deep tissue injuries:-Stable-Xray feet with degenerative changes but no acute findings-?Podiatry following - no dressing requirements, WBAT to BLE.No?indication for podiatric surgical intervention at this time. ?Patient may follow-up with Dr. Gazes within 3-4 weeks of discharge. -?No need for abx at this time --BLE US-no DVT-tramadol 25 mg q12h, prn for pain?Hx Bilateral DVT:- Apixaban 5mg  BID-HELD--consider restart now that PO intake is stabilized.-c/w Lovenox 80 mg q12H?Elevated LFC ratio:Iron deficiency anemia:-Hematology consult &  signed off - elevated FLC ratio felt to be reactive, plasma cell disorder unlikely ?Chronic conditions:-Rheumatoid Arthritis - has been off of MTX since November/acute illness, rheumatology signing off current ams/weakness not felt to be related to her RA, follow up outpatient -Bilateral LE DVT - ?provoked from recent surgery, cont lovenox, can resume apixaban at discharge?Dysphagia:-resolved-s/p  TF + FWF-SLP consulted--for re-evluation-aspiration precautionsGOC and Capacity:- during hospitalization November 2022 with perforated diverticulum, deemed at this time not to have capacity. PEG was ultimately placed and TFs were initiated. She also cleared for non modified regular diet.FEN: diet, K>4, Mg>2 Lines: PIV Tubes: NoneMobility: pendingPT/OT: --> LTACDisposition: Awaiting T19 and LTAC?Medication Reconciliation: In ProgressCommunication: pendingSigned:Millena Callins Vivi Martens, MD, MSc  Communication via [facility] Hospitalist Service4/21/2023, 10:41 AMTime: >35  minutes : time spent obtaining history from patient or surrogate, review of medical records/prior chart documentation , examining/evaluating patient, ordering or performing treatments/interventions; ordering/reviewing laboratory studies, radiographic studies, pulse oximetry; re-evaluation of patient's condition and development of treatment plan, discussions with consultants, evaluation of patient's response to treatment. With 50% of time spent in care follow-up/care coordination, plan of care discussion with consulting specialities, and with the patient and/or patient's family/HCPOA/Surrogate Code Status Full Code DVT Prophylaxis  Lovenox Sc --> DOAC ADMIT MED REC In Progress PCP Pcp, Does Not Have A None Emergency contact Extended Emergency Contact InformationPrimary Emergency Contact: McCulley,ReneeHome Phone: [phone] Disposition TBD

## 2022-04-11 NOTE — Plan of Care
Plan of Care Overview/ Patient Status    Patient is AAO to self and knows she is in a hospital, on room air, lungs diminished bilaterally, VSS. 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 Patient is incontinent of bladder and a purewick is being utilized. Patient denies pain at this time. Skin noted to have multiple scar tissue locations to bilateral buttocks and to lateral feet. Patient with patent IV access, #22 to left arm. Patient is on a regular diet with poor PO intake and also has bolus feeds of jevity 1.5 via PEG tube twice a shift. Patient is assist x2 in bed. Call bell is within reach. Bed alarm activated.  Problem: Adult Inpatient Plan of CareGoal: Plan of Care ReviewOutcome: Interventions implemented as appropriateGoal: Patient-Specific Goal (Individualized)Outcome: Interventions implemented as appropriateGoal: Absence of Hospital-Acquired Illness or InjuryOutcome: Interventions implemented as appropriateGoal: Optimal Comfort and WellbeingOutcome: Interventions implemented as appropriateGoal: Readiness for Transition of CareOutcome: Interventions implemented as appropriate Problem: InfectionGoal: Absence of Infection Signs and SymptomsOutcome: Interventions implemented as appropriate Problem: Physical Therapy GoalsGoal: Physical Therapy GoalsDescription: PT GOALS - addressed 4/10/231. Patient will demonstrate active range of motion x 4 extremities in preparation for mobility (GOAL MET)2. Caregivers will be independent and safely implement a range of motion/positioning program to prevent loss of range of motion and skin integrity (ONGOING)3. Patient will perform bed mobility with maximal assist (GOAL MET)4. Patient will tolerate sitting edge of bed >5 minutes with minimal assist (ONGOING)5. Patient will perform transfers with moderate assist of 2 using rolling walker (ONGOING)6. Pt will perform bed mobility w/ modAx1 (NEW) Outcome: Interventions implemented as appropriate Problem: Occupational Therapy GoalsGoal: Occupational Therapy GoalsDescription: OT goals1. Pt will move to EOB for seated ADL mod I2. Pt will follow 3/4 simple one step motor commands IND3. Pt will sequence through set up ADL task INDOutcome: Interventions implemented as appropriate Problem: Impaired Wound HealingGoal: Optimal Wound HealingOutcome: Interventions implemented as appropriate Problem: Fall Injury RiskGoal: Absence of Fall and Fall-Related InjuryOutcome: Interventions implemented as appropriate Problem: Violence Risk or ActualGoal: Anger and Impulse ControlOutcome: Interventions implemented as appropriate Problem: Skin Injury Risk IncreasedGoal: Skin Health and IntegrityOutcome: Interventions implemented as appropriate Problem: Mobility ImpairmentGoal: Optimal MobilityOutcome: Interventions implemented as appropriate Problem: Aspiration (Enteral Nutrition)Goal: Absence of Aspiration Signs and SymptomsOutcome: Interventions implemented as appropriate Problem: Device-Related Complication Risk (Enteral Nutrition)Goal: Safe, Effective Therapy DeliveryOutcome: Interventions implemented as appropriate Problem: Confusion AcuteGoal: Optimal Cognitive FunctionOutcome: Interventions implemented as appropriate Problem: Pain AcuteGoal: Acceptable Pain Control and Functional AbilityOutcome: Interventions implemented as appropriate Problem: Speech Language Pathology GoalsGoal: Dysphagia Goals, SLPDescription: Current goal: Patient will tolerate regular solids and thin liquids without adverse impact on respiratory status and while maintaining adequate oral nutrition.Prior goal (met): The patient will tolerate least restrictive diet without any decline in respiratory status.Prior goal (met): Pt will tolerate pureed diet with thin liquids w/o s/x of aspiration or adverse impact on respiratory status.Outcome: Interventions implemented as appropriate Problem: Self-Care DeficitGoal: Improved Ability to Complete Activities of Daily LivingOutcome: Interventions implemented as appropriate Problem: Fluid, Electrolyte and Nutrition Imbalance (Ileostomy)Goal: Fluid, Electrolyte and Nutrition BalanceOutcome: Interventions implemented as appropriate Problem: Infection (Ileostomy)Goal: Absence of Infection Signs and SymptomsOutcome: Interventions implemented as appropriate Problem: Skin or Soft Tissue InfectionGoal: Absence of Infection Signs and SymptomsOutcome: Interventions implemented as appropriate

## 2022-04-12 NOTE — Plan of Care
Plan of Care Overview/ Patient Status    0700-1530Patient is A&Ox2, oriented to person and place. VSS on RA. Complains of leg pain w/ movement, pillow for support. Takes pills crushed through PEG.  Bolus tube feed per order, this RN encouraged the patient to eat PO however the patient declined. Ostomy w/ no output and ileostomy with output. Incontinent of urine. Purewick cath in place. Discoloration on L buttock from old wound.  AX2 turn and reposition q2hr. Patient requested a break from podus boots, feet elevated while boots are off. Safety checks and hourly rounding complete, see flow sheet and MAR for details. Problem: Adult Inpatient Plan of CareGoal: Plan of Care ReviewOutcome: Interventions implemented as appropriateGoal: Patient-Specific Goal (Individualized)Outcome: Interventions implemented as appropriateGoal: Absence of Hospital-Acquired Illness or InjuryOutcome: Interventions implemented as appropriateGoal: Optimal Comfort and WellbeingOutcome: Interventions implemented as appropriateGoal: Readiness for Transition of CareOutcome: Interventions implemented as appropriate Problem: InfectionGoal: Absence of Infection Signs and SymptomsOutcome: Interventions implemented as appropriate Problem: Physical Therapy GoalsGoal: Physical Therapy GoalsDescription: PT GOALS - addressed 4/10/231. Patient will demonstrate active range of motion x 4 extremities in preparation for mobility (GOAL MET)2. Caregivers will be independent and safely implement a range of motion/positioning program to prevent loss of range of motion and skin integrity (ONGOING)3. Patient will perform bed mobility with maximal assist (GOAL MET)4. Patient will tolerate sitting edge of bed >5 minutes with minimal assist (ONGOING)5. Patient will perform transfers with moderate assist of 2 using rolling walker (ONGOING)6. Pt will perform bed mobility w/ modAx1 (NEW) Outcome: Interventions implemented as appropriate Problem: Occupational Therapy GoalsGoal: Occupational Therapy GoalsDescription: OT goals1. Pt will move to EOB for seated ADL mod I2. Pt will follow 3/4 simple one step motor commands IND3. Pt will sequence through set up ADL task INDOutcome: Interventions implemented as appropriate Problem: Impaired Wound HealingGoal: Optimal Wound HealingOutcome: Interventions implemented as appropriate Problem: Fall Injury RiskGoal: Absence of Fall and Fall-Related InjuryOutcome: Interventions implemented as appropriate Problem: Violence Risk or ActualGoal: Anger and Impulse ControlOutcome: Interventions implemented as appropriate Problem: Skin Injury Risk IncreasedGoal: Skin Health and IntegrityOutcome: Interventions implemented as appropriate Problem: Mobility ImpairmentGoal: Optimal MobilityOutcome: Interventions implemented as appropriate Problem: Aspiration (Enteral Nutrition)Goal: Absence of Aspiration Signs and SymptomsOutcome: Interventions implemented as appropriate Problem: Device-Related Complication Risk (Enteral Nutrition)Goal: Safe, Effective Therapy DeliveryOutcome: Interventions implemented as appropriate Problem: Confusion AcuteGoal: Optimal Cognitive FunctionOutcome: Interventions implemented as appropriate Problem: Pain AcuteGoal: Acceptable Pain Control and Functional AbilityOutcome: Interventions implemented as appropriate Problem: Speech Language Pathology GoalsGoal: Dysphagia Goals, SLPDescription: Current goal: Patient will tolerate regular solids and thin liquids without adverse impact on respiratory status and while maintaining adequate oral nutrition.Prior goal (met): The patient will tolerate least restrictive diet without any decline in respiratory status.Prior goal (met): Pt will tolerate pureed diet with thin liquids w/o s/x of aspiration or adverse impact on respiratory status.Outcome: Interventions implemented as appropriate Problem: Self-Care DeficitGoal: Improved Ability to Complete Activities of Daily LivingOutcome: Interventions implemented as appropriate Problem: Fluid, Electrolyte and Nutrition Imbalance (Ileostomy)Goal: Fluid, Electrolyte and Nutrition BalanceOutcome: Interventions implemented as appropriate Problem: Infection (Ileostomy)Goal: Absence of Infection Signs and SymptomsOutcome: Interventions implemented as appropriate Problem: Skin or Soft Tissue InfectionGoal: Absence of Infection Signs and SymptomsOutcome: Interventions implemented as appropriate

## 2022-04-12 NOTE — Progress Notes
[facility]	 [facility]	Medicine Progress NoteAttending Provider: Donald Siva Berkley Harvey, MDPCP: Pcp, Does Not Have A NoneHospital Day: 42 Patient examined by me: 4/22/2023Subjective: CC: Fever, unspecified fever cause Interim History: Patient seen and examined at bedside. Patient feeling poor appetite today. Reported no BLE discomfort. POC reviewed.Overnight Events: NoneReview of Systems Constitutional: Negative for chills. Respiratory: Negative for shortness of breath.  Cardiovascular: Negative for chest pain and palpitations. Gastrointestinal: Negative for abdominal pain, nausea and vomiting. Review of Allergies/Meds/Hx: I have reviewed the patient's: allergies, current scheduled medications, current infusions, current prn medications, past medical history, past surgical history, family history, social history and prior to admission medications.Scheduled Meds:Current Facility-Administered Medications Medication Dose Route Frequency Provider Last Rate Last Admin ? apixaban (ELIQUIS) tablet 5 mg  5 mg Per G Tube Q12H Jermayne Sweeney, Berkley Harvey, MD   5 mg at 04/12/22 7253 ? Banatrol Plus oral powder packet  1 packet Per G Tube BID Charlett Blake, MD   1 packet at 04/12/22 6644 ? camphor-menthoL (SARNA) lotion   Topical (Top) BID Carlicia Leavens, Berkley Harvey, MD   Given at 04/11/22 2036 ? Elemental iron 60 mg (300 mg Ferrous sulfate) / 5 mL oral liquid 45 mg  45 mg Per G Tube BID Peter Congo, Adil, MD   45 mg at 04/12/22 0347 ? famotidine (PEPCID) suspension (8 mg/mL) 20 mg  20 mg Per G Tube Daily Peter Congo, Adil, MD   20 mg at 04/12/22 4259 ? folic acid (FOLVITE) tablet 1 mg  1 mg Per G Tube Daily Peter Congo, Adil, MD   1 mg at 04/12/22 5638 ? gabapentin (NEURONTIN) capsule 100 mg  100 mg Oral Nightly Kang-Giaimo, Devona Konig, MD   100 mg at 04/11/22 2035 ? lidocaine (LIDODERM) 5 % 1 patch  1 patch Transdermal Q24H Simeon Craft, MD   1 patch at 04/11/22 2036 ? melatonin tablet 3 mg  3 mg Per G Tube Nightly Corina Stacy, Berkley Harvey, MD   3 mg at 04/11/22 2035 ? niacin Immediate Release tablet 100 mg  100 mg Per G Tube Nightly Bhutta, Adil, MD   100 mg at 04/11/22 2035 ? nystatin (MYCOSTATIN) powder 1 Application  1 Application Topical (Top) BID Henriette Combs, MD   1 Application at 04/11/22 2037 ? pyridoxine (vitamin B6) (B-6) tablet 100 mg  100 mg Per G Tube Daily Peter Congo, Adil, MD   100 mg at 04/12/22 7564 ? [Held by provider] rosuvastatin (CRESTOR) tablet 10 mg  10 mg Per G Tube Nightly Dorisann Frames, MD   10 mg at 02/14/22 2245 ? sodium chloride 0.9 % flush 3 mL  3 mL IV Push Q8H Dorisann Frames, MD   3 mL at 04/12/22 3329 ? thiamine (VITAMIN B1) tablet 100 mg  100 mg Per G Tube BID Peter Congo, Adil, MD   100 mg at 04/12/22 5188 Continuous Infusions:PRN Meds:.acetaminophen, dextrose (GLUCOSE) oral liquid 15 g **OR** fruit juice **OR** skim milk, dextrose (GLUCOSE) oral liquid 30 g **OR** fruit juice, dextrose injection, dextrose injection, gabapentin, glucagon, ondansetron **OR** ondansetron (ZOFRAN) IV Push, polyethylene glycol, sodium chloride, traMADoLCurrent Facility-Administered Medications Medication Dose Route Frequency Last Rate ? acetaminophen  650 mg Per G Tube Q6H PRN   ? apixaban  5 mg Per G Tube Q12H   ? banana flakes-t-galactooligos.  1 packet Per G Tube BID   ? camphor-menthoL   Topical (Top) BID   ? dextrose (GLUCOSE) oral liquid 15 g  15 g Oral Q15 MIN PRN  Or ? fruit juice  120 mL Oral Q15 MIN PRN    Or ? skim milk  240 mL Oral Q15 MIN PRN   ? dextrose (GLUCOSE) oral liquid 30 g  30 g Oral Q15 MIN PRN    Or ? fruit juice  240 mL Oral Q15 MIN PRN   ? dextrose injection  12.5 g Intravenous Q15 MIN PRN   ? dextrose injection  25 g Intravenous Q15 MIN PRN   ? Elemental iron  45 mg Per G Tube BID   ? famotidine  20 mg Per G Tube Daily   ? folic acid  1 mg Per G Tube Daily   ? gabapentin  100 mg Oral Nightly   ? gabapentin  100 mg Oral BID PRN   ? glucagon  1 mg Intramuscular Once PRN   ? Lidocaine Patch  1 patch Transdermal Q24H   ? melatonin  3 mg Per G Tube Nightly   ? niacin  100 mg Per G Tube Nightly   ? nystatin  1 Application Topical (Top) BID   ? ondansetron  4 mg Oral Q6H PRN    Or ? ondansetron (ZOFRAN) IV Push  4 mg IV Push Q6H PRN   ? polyethylene glycol  17 g Per G Tube DAILY PRN   ? pyridoxine (vitamin B6)  100 mg Per G Tube Daily   ? [Held by provider] rosuvastatin  10 mg Per G Tube Nightly   ? sodium chloride  3 mL IV Push Q8H   ? sodium chloride  3 mL IV Push PRN for Line Care   ? thiamine  100 mg Per G Tube BID   ? traMADoL  25 mg Oral Q12H PRN   Objective: Vitals: Patient Vitals for the past 24 hrs: Temp Temp src Pulse BP NIBP MAP (Monitor/Manual Entry) NIBP MAP (mmHg) (calculated/READ ONLY) Resp SpO2 04/12/22 1217 98.4 ?F (36.9 ?C) Oral 81 105/68 80 80 16 95 % 04/12/22 0756 98.4 ?F (36.9 ?C) Oral 69 112/78 89 89 18 98 % 04/12/22 0415 98.1 ?F (36.7 ?C) Oral 86 112/73 86 86 20 96 % 04/11/22 2330 98.1 ?F (36.7 ?C) Oral 90 100/64 76 76 20 96 % 04/11/22 2124 97.4 ?F (36.3 ?C) Oral (!) 100 108/70 83 83 20 97 % 04/11/22 1621 97.7 ?F (36.5 ?C) Oral 79 122/81 95 95 16 98 % Physical Exam:  Constitutional: oriented to person, and no acute distress.Marland KitchenHEENT: Head: NCAT. Eyes: EOMI. PERRL. Neck: Neck supple. Cardiovascular: RRRS1S2.  No murmursPulmonary/Chest: BS +. No crackles. No wheezes.Abdominal: Soft. BS +. Non-tender, Non-distended. Ileostomy-no blood. PEG site c/d/i.GU: PurewickMusculoskeletal: Normal ROM. Neurological: alert and oriented to person. Non focal--BLE weakness & drop foot.Extremities: BLE + edema. Skin: No rashesLabs: Monday labs pendingNo results for input(s): NA, K, CL, CO2, BUN, CREATININE in the last 72 hours.No results for input(s): WBC, HGB, HCT, PLT in the last 72 hours.No results for input(s): ALT, AST, BILITOT in the last 72 hours.Invalid input(s): BILIDMicro: Covid negativeProcedures:None IV Access: PIVDiagnostics:No new radiology.No results found for this or any previous visit.ECG/Tele Events: I have reviewed the patient's ECG as resulted in the EMR.Assessment: Assessment: 66 y.o. female with PMH of RA, recent prolonged hospitalization for perforated diverticulitis (s/p ex Lap/R hemicolectomy with end ileostomy). Admitted 2/24 for LTAC placement and persistent encephalopathy. Found to be febrile without clear infectious source found, fevers resolved. Extensive work-up of encephalopathy and leg weakness unremarkable. Course c/b elevated LFTs which resolved, likely 2/2 DILI from ceftriaxone or Zosyn.  Stable and awaiting T19/LTC placement, but with ongoing significant hypercalcemia.?Mental status appears improved.Plan: Hx Recurrent Fever/R-foot Cellulitis:-transient fever resolved--last fever 04/05/22, no leukocytosis-PCT decreased 5.95--> 2.08-No clear source identified, possibly 2/2 DVT vs autoimmune?-BCx-04/02/22--NGTD-S/p CTX 2/21 and Zosyn 2/21-2/25-CXR-no acute pathology-prior UA-benign-completed ABX-Oxacillin-consulted Podiatry--appreciated recs -trend fever/CBC prn-Tylenol prn?Hypercalcemia:-Acute on?chronic--resolved-PTH normal (28); work-up including PTH-rP (9), vitamin D testing, TSH, random cortisol unrevealing-Possibly d/t immobility or primary hyperparathyroidism; -Endocrine consulted -s/p Zolendronic acid 4 mg X1 (3/21)-fasting CTx (Collagen Type-1 Telopeptide)-elevated?Acute encephalopathy:LE weakness:-Unclear etiology, possibly prolonged delirium iso hospitalization from Nov 2022 - currentMRI brain, MRI total spine unremarkable-LP unremarkable-Paraneoplastic and autoimmune encephalopathy panels negative-Overall somewhat improved cognitive status during admission, remains alert and oriented x 1-2-Neurology rec'd EMG to complete work-up of LE weakness but pt declined; Neurology does not recommend any further testing and signed off 02/26/22-c/w Gabapentin 100 mg QHS -BLE US-no DVT?Perforated diverticulum: -s/p R hemicolectomy, end ileostomy/LFT elevation-c/b fluid abscess requiring drainage during Nov 2022 admission in [place]. [place]-?. 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?on this admission?now [place] scan shows improved Peritoneal nodules?Per 3/30 notes, seems imaging was reviewed and that nodules were improving and thought to be resolution of continued debris.-Work-up showed elevated IgG, smooth muscle antibodies c/f possible autoimmune hepatitisInitial concern that imaging showed peritoneal nodules ?carcinomatosis-On review of imaging, nodules improving, more likely to be continued resolution of debris from perforation-Liver tests resolved without intervention, so most likely DILI from antibiotics (ceftriaxone and Zosyn)?R lateral foot and L medial eminence deep tissue injuries:-Stable-Xray feet with degenerative changes but no acute findings-?Podiatry following - no dressing requirements, WBAT to BLE.No?indication for podiatric surgical intervention at this time. ?Patient may follow-up with Dr. Gazes within 3-4 weeks of discharge. -?No need for abx at this time --BLE US-no DVT-tramadol 25 mg q12h, prn for pain?Hx Bilateral DVT:- Apixaban 5mg  BID--restarted-dc'd Lovenox 80 mg q12H?Iron deficiency anemia:-Hematology consult &  signed off - elevated FLC ratio felt to be reactive, plasma cell disorder unlikely ?Chronic conditions:-Rheumatoid Arthritis - has been off of MTX since November/acute illness, rheumatology signing off current ams/weakness not felt to be related to her RA, follow up outpatient -Bilateral LE DVT - ?provoked from recent surgery, cont lovenox, can resume apixaban at discharge?Dysphagia:-resolved-s/p  TF + FWF-SLP consulted--for re-evluation-aspiration precautionsGOC and Capacity:- during hospitalization November 2022 with perforated diverticulum, deemed at this time not to have capacity. PEG was ultimately placed and TFs were initiated. She also cleared for non modified regular diet.FEN: diet, K>4, Mg>2 Lines: PIV Tubes: NoneMobility: pendingPT/OT: --> LTACDisposition: Awaiting T19 and LTAC?Medication Reconciliation: In ProgressCommunication: pendingSigned:Warden Buffa Vivi Martens, MD, MSc  Communication via [facility] Hospitalist Service4/22/2023, 12:29 PMTime: >25  minutes : time spent obtaining history from patient or surrogate, review of medical records/prior chart documentation , examining/evaluating patient, ordering or performing treatments/interventions; ordering/reviewing laboratory studies, radiographic studies, pulse oximetry; re-evaluation of patient's condition and development of treatment plan, discussions with consultants, evaluation of patient's response to treatment. With 50% of time spent in care follow-up/care coordination, plan of care discussion with consulting specialities, and with the patient and/or patient's family/HCPOA/Surrogate Code Status Full Code DVT Prophylaxis  Lovenox Sc --> DOAC ADMIT MED REC In Progress PCP Pcp, Does Not Have A None Emergency contact Extended Emergency Contact InformationPrimary Emergency Contact: McCulley,ReneeHome Phone: [phone] Disposition TBD

## 2022-04-13 NOTE — Plan of Care
Plan of Care Overview/ Patient Status    0700-1900Alert confused. VSS room air. Patient does not report pain at this time.Assist x2 in bed. Colotomy bags intact R bag output documented, L bag no output. Purewick in place draining yellow urine. Jevity bolus feeds maintained as per provider. Patient safety maintained.

## 2022-04-13 NOTE — Plan of Care
Plan of Care Overview/ Patient Status    2300 - 0700 A&Ox2-3. Knows month and president. D/o to situation. VSS on RA. Reported severe BLE pain, PRN gabapentin and Tylenol given, repositioned, appeared to fall asleep shortly after administration. PEG patent, no residual, flushing easily. Ileostomy w/mushy stool and flatus. Both appliances intact. T&P'd as tolerated. Awaiting conservatorship & LTP. Hourly rounding performed and safety maintained. Please continue to follow POC. See flow sheets for complete assessment. Problem: Adult Inpatient Plan of CareGoal: Plan of Care ReviewOutcome: Interventions implemented as appropriateFlowsheets (Taken 04/13/2022 0245)Progress: improvingPlan of Care Reviewed With: patient

## 2022-04-13 NOTE — Progress Notes
[facility]	 [facility]	Medicine Progress NoteAttending Provider: Donald Siva Berkley Harvey, MDPCP: Pcp, Does Not Have A NoneHospital Day: 33 Patient examined by me: 4/23/2023Subjective: CC: Fever, unspecified fever cause Interim History: Patient seen and examined at bedside. Patient feeling wel today. Reported no BLE discomfort. POC reviewed.Overnight Events: NoneReview of Systems Constitutional: Negative for chills. Respiratory: Negative for shortness of breath.  Cardiovascular: Negative for chest pain and palpitations. Gastrointestinal: Negative for abdominal pain, nausea and vomiting. Review of Allergies/Meds/Hx: I have reviewed the patient's: allergies, current scheduled medications, current infusions, current prn medications, past medical history, past surgical history, family history, social history and prior to admission medications.Scheduled Meds:Current Facility-Administered Medications Medication Dose Route Frequency Provider Last Rate Last Admin ? apixaban (ELIQUIS) tablet 5 mg  5 mg Per G Tube Q12H Damarious Holtsclaw, Berkley Harvey, MD   5 mg at 04/13/22 0911 ? Banatrol Plus oral powder packet  1 packet Per G Tube BID Charlett Blake, MD   1 packet at 04/13/22 0910 ? camphor-menthoL (SARNA) lotion   Topical (Top) BID Jax Kentner, Berkley Harvey, MD   Given at 04/13/22 [phone] ? Elemental iron 60 mg (300 mg Ferrous sulfate) / 5 mL oral liquid 45 mg  45 mg Per G Tube BID Peter Congo, Adil, MD   45 mg at 04/13/22 0910 ? famotidine (PEPCID) suspension (8 mg/mL) 20 mg  20 mg Per G Tube Daily Bhutta, Adil, MD   20 mg at 04/13/22 0910 ? folic acid (FOLVITE) tablet 1 mg  1 mg Per G Tube Daily Bhutta, Adil, MD   1 mg at 04/13/22 9604 ? gabapentin (NEURONTIN) capsule 100 mg  100 mg Oral Nightly Kang-Giaimo, Devona Konig, MD   100 mg at 04/12/22 2106 ? lidocaine (LIDODERM) 5 % 1 patch  1 patch Transdermal Q24H Simeon Craft, MD   1 patch at 04/12/22 2106 ? melatonin tablet 3 mg  3 mg Per G Tube Nightly Dalayla Aldredge, Berkley Harvey, MD   3 mg at 04/12/22 2105 ? niacin Immediate Release tablet 100 mg  100 mg Per G Tube Nightly Bhutta, Adil, MD   100 mg at 04/12/22 2105 ? nystatin (MYCOSTATIN) powder 1 Application  1 Application Topical (Top) BID Henriette Combs, MD   1 Application at 04/13/22 0912 ? pyridoxine (vitamin B6) (B-6) tablet 100 mg  100 mg Per G Tube Daily Peter Congo, Adil, MD   100 mg at 04/13/22 0911 ? [Held by provider] rosuvastatin (CRESTOR) tablet 10 mg  10 mg Per G Tube Nightly Dorisann Frames, MD   10 mg at 02/14/22 2245 ? sodium chloride 0.9 % flush 3 mL  3 mL IV Push Q8H Dorisann Frames, MD   3 mL at 04/13/22 5409 ? thiamine (VITAMIN B1) tablet 100 mg  100 mg Per G Tube BID Peter Congo, Adil, MD   100 mg at 04/13/22 0911 Continuous Infusions:PRN Meds:.acetaminophen, dextrose (GLUCOSE) oral liquid 15 g **OR** fruit juice **OR** skim milk, dextrose (GLUCOSE) oral liquid 30 g **OR** fruit juice, dextrose injection, dextrose injection, gabapentin, glucagon, ondansetron **OR** ondansetron (ZOFRAN) IV Push, polyethylene glycol, sodium chloride, traMADoLCurrent Facility-Administered Medications Medication Dose Route Frequency Last Rate ? acetaminophen  650 mg Per G Tube Q6H PRN   ? apixaban  5 mg Per G Tube Q12H   ? banana flakes-t-galactooligos.  1 packet Per G Tube BID   ? camphor-menthoL   Topical (Top) BID   ? dextrose (GLUCOSE) oral liquid 15 g  15 g Oral Q15 MIN PRN    Or ?  fruit juice  120 mL Oral Q15 MIN PRN    Or ? skim milk  240 mL Oral Q15 MIN PRN   ? dextrose (GLUCOSE) oral liquid 30 g  30 g Oral Q15 MIN PRN    Or ? fruit juice  240 mL Oral Q15 MIN PRN   ? dextrose injection  12.5 g Intravenous Q15 MIN PRN   ? dextrose injection  25 g Intravenous Q15 MIN PRN   ? Elemental iron  45 mg Per G Tube BID   ? famotidine  20 mg Per G Tube Daily   ? folic acid  1 mg Per G Tube Daily ? gabapentin  100 mg Oral Nightly   ? gabapentin  100 mg Oral BID PRN   ? glucagon  1 mg Intramuscular Once PRN   ? Lidocaine Patch  1 patch Transdermal Q24H   ? melatonin  3 mg Per G Tube Nightly   ? niacin  100 mg Per G Tube Nightly   ? nystatin  1 Application Topical (Top) BID   ? ondansetron  4 mg Oral Q6H PRN    Or ? ondansetron (ZOFRAN) IV Push  4 mg IV Push Q6H PRN   ? polyethylene glycol  17 g Per G Tube DAILY PRN   ? pyridoxine (vitamin B6)  100 mg Per G Tube Daily   ? [Held by provider] rosuvastatin  10 mg Per G Tube Nightly   ? sodium chloride  3 mL IV Push Q8H   ? sodium chloride  3 mL IV Push PRN for Line Care   ? thiamine  100 mg Per G Tube BID   ? traMADoL  25 mg Oral Q12H PRN   Objective: Vitals: Patient Vitals for the past 24 hrs: Temp Temp src Pulse BP NIBP MAP (Monitor/Manual Entry) NIBP MAP (mmHg) (calculated/READ ONLY) Resp SpO2 04/13/22 0742 98.2 ?F (36.8 ?C) Oral 66 113/68 83 83 16 96 % 04/13/22 0620 -- -- -- -- -- -- 18 -- 04/13/22 0145 -- -- -- -- -- -- 20 -- 04/12/22 2349 98 ?F (36.7 ?C) Oral 77 118/74 89 89 19 96 % 04/12/22 2013 98.4 ?F (36.9 ?C) Oral 82 108/67 80 80 16 97 % Physical Exam:  Constitutional: oriented to person, and no acute distress.Marland KitchenHEENT: Head: NCAT. Eyes: EOMI. PERRL. Neck: Neck supple. Cardiovascular: RRRS1S2.  No murmursPulmonary/Chest: BS +. No crackles. No wheezes.Abdominal: Soft. BS +. Non-tender, Non-distended. Ileostomy-no blood. PEG site c/d/i.GU: PurewickMusculoskeletal: Normal ROM. Neurological: alert and oriented to person. Non focal--BLE weakness & slight drop foot.Extremities: BLE + edema. Skin: No rashesLabs: Monday labs pendingNo results for input(s): NA, K, CL, CO2, BUN, CREATININE in the last 72 hours.No results for input(s): WBC, HGB, HCT, PLT in the last 72 hours.No results for input(s): ALT, AST, BILITOT in the last 72 hours.Invalid input(s): BILIDMicro: Covid negativeProcedures:None IV Access: PIVDiagnostics:No new radiology.No results found for this or any previous visit.ECG/Tele Events: I have reviewed the patient's ECG as resulted in the EMR.Assessment: Assessment: 66 y.o. female with PMH of RA, recent prolonged hospitalization for perforated diverticulitis (s/p ex Lap/R hemicolectomy with end ileostomy). Admitted 2/24 for LTAC placement and persistent encephalopathy. Found to be febrile without clear infectious source found, fevers resolved. Extensive work-up of encephalopathy and leg weakness unremarkable. Course c/b elevated LFTs which resolved, likely 2/2 DILI from ceftriaxone or Zosyn. Stable and awaiting T19/LTC placement, but with ongoing significant hypercalcemia.?Mental status appears improved.Plan: Hx Recurrent Fever/R-foot Cellulitis:-transient fever resolved--last fever 04/05/22, no leukocytosis-PCT decreased 5.95--> 2.08-No  clear source identified, possibly 2/2 DVT vs autoimmune?-BCx-04/02/22--NGTD-S/p CTX 2/21 and Zosyn 2/21-2/25-CXR-no acute pathology-prior UA-benign-completed ABX-Oxacillin-consulted Podiatry--appreciated recs -trend fever/CBC prn-Tylenol prn?Hypercalcemia:-Acute on?chronic--resolved-PTH normal (28); work-up including PTH-rP (9), vitamin D testing, TSH, random cortisol unrevealing-Possibly d/t immobility or primary hyperparathyroidism; -Endocrine consulted -s/p Zolendronic acid 4 mg X1 (3/21)-fasting CTx (Collagen Type-1 Telopeptide)-elevated?Acute encephalopathy:LE weakness:-Unclear etiology, possibly prolonged delirium iso hospitalization from Nov 2022 - currentMRI brain, MRI total spine unremarkable-LP unremarkable-Paraneoplastic and autoimmune encephalopathy panels negative-Overall somewhat improved cognitive status during admission, remains alert and oriented x 1-2-Neurology rec'd EMG to complete work-up of LE weakness but pt declined; Neurology does not recommend any further testing and signed off 02/26/22-c/w Gabapentin 100 mg QHS -BLE US-no DVT?Perforated diverticulum: -s/p R hemicolectomy, end ileostomy/LFT elevation-c/b fluid abscess requiring drainage during Nov 2022 admission in [place]. [place]-?. 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?on this admission?now [place] scan shows improved Peritoneal nodules?Per 3/30 notes, seems imaging was reviewed and that nodules were improving and thought to be resolution of continued debris.-Work-up showed elevated IgG, smooth muscle antibodies c/f possible autoimmune hepatitisInitial concern that imaging showed peritoneal nodules ?carcinomatosis-On review of imaging, nodules improving, more likely to be continued resolution of debris from perforation-Liver tests resolved without intervention, so most likely DILI from antibiotics (ceftriaxone and Zosyn)?R lateral foot and L medial eminence deep tissue injuries:-Stable-Xray feet with degenerative changes but no acute findings-?Podiatry following - no dressing requirements, WBAT to BLE.No?indication for podiatric surgical intervention at this time. ?Patient may follow-up with Dr. Gazes within 3-4 weeks of discharge. -?No need for abx at this time --BLE US-no DVT-tramadol 25 mg q12h, prn for pain?Hx Bilateral DVT:- Apixaban 5mg  BID--restarted-dc'd Lovenox 80 mg q12H?Iron deficiency anemia:-Hematology consult &  signed off - elevated FLC ratio felt to be reactive, plasma cell disorder unlikely ?Chronic conditions:-Rheumatoid Arthritis - has been off of MTX since November/acute illness, rheumatology signing off current ams/weakness not felt to be related to her RA, follow up outpatient -Bilateral LE DVT - ?provoked from recent surgery, cont lovenox, can resume apixaban at discharge?Dysphagia:-resolved-s/p  TF + FWF-SLP consulted--for re-evluation-aspiration precautionsGOC and Capacity:- during hospitalization November 2022 with perforated diverticulum, deemed at this time not to have capacity. PEG was ultimately placed and TFs were initiated. She also cleared for non modified regular diet.FEN: diet, K>4, Mg>2 Lines: PIV Tubes: NoneMobility: pendingPT/OT: --> LTACDisposition: Awaiting T19 and LTAC?Medication Reconciliation: In ProgressCommunication: pendingSigned:Evoleht Hovatter Vivi Martens, MD, MSc  Communication via [facility] Hospitalist Service4/23/2023, 12:39 PMTime: >25  minutes : time spent obtaining history from patient or surrogate, review of medical records/prior chart documentation , examining/evaluating patient, ordering or performing treatments/interventions; ordering/reviewing laboratory studies, radiographic studies, pulse oximetry; re-evaluation of patient's condition and development of treatment plan, discussions with consultants, evaluation of patient's response to treatment. With 50% of time spent in care follow-up/care coordination, plan of care discussion with consulting specialities, and with the patient and/or patient's family/HCPOA/Surrogate Code Status Full Code DVT Prophylaxis  Lovenox Sc --> DOAC ADMIT MED REC In Progress PCP Pcp, Does Not Have A None Emergency contact Extended Emergency Contact InformationPrimary Emergency Contact: McCulley,ReneeHome Phone: [phone] Disposition TBD

## 2022-04-13 NOTE — Plan of Care
Plan of Care Overview/ Patient Status    1500-2300  Patient is alert and oriented x 3. VSS on RA. Medicated with tramadol for complaint of pain to left leg. Lidocaine patch also applied. Purewick in place. Ostomy to right abdomen leaked and appliance changed. Assist x 1-2 in bed. Bolus tube feed administered at 5pm and 9pm as ordered. Tray also ordered, but patient has poor po intake.

## 2022-04-14 ENCOUNTER — Ambulatory Visit: Admit: 2022-04-14 | Payer: PRIVATE HEALTH INSURANCE | Attending: Internal Medicine | Primary: Internal Medicine

## 2022-04-14 LAB — COMPREHENSIVE METABOLIC PANEL
BKR A/G RATIO: 0.6 — ABNORMAL LOW (ref 1.0–2.2)
BKR ALANINE AMINOTRANSFERASE (ALT): 15 U/L (ref 10–35)
BKR ALBUMIN: 2.9 g/dL — ABNORMAL LOW (ref 3.6–4.9)
BKR ALKALINE PHOSPHATASE: 75 U/L (ref 9–122)
BKR ANION GAP: 10 (ref 7–17)
BKR ASPARTATE AMINOTRANSFERASE (AST): 43 U/L — ABNORMAL HIGH (ref 10–35)
BKR AST/ALT RATIO: 2.9
BKR BILIRUBIN TOTAL: 0.2 mg/dL (ref <=1.2)
BKR BLOOD UREA NITROGEN: 11 mg/dL (ref 8–23)
BKR BUN / CREAT RATIO: 40.7 — ABNORMAL HIGH (ref 8.0–23.0)
BKR CALCIUM: 10.3 mg/dL — ABNORMAL HIGH (ref 8.8–10.2)
BKR CHLORIDE: 105 mmol/L (ref 98–107)
BKR CO2: 16 mmol/L — ABNORMAL LOW (ref 20–30)
BKR CREATININE: 0.27 mg/dL — ABNORMAL LOW (ref 0.40–1.30)
BKR EGFR, CREATININE (CKD-EPI 2021): >60 mL/min/{1.73_m2} (ref >=60)
BKR GLOBULIN: 5.2 g/dL — ABNORMAL HIGH (ref 2.3–3.5)
BKR GLUCOSE: 91 mg/dL (ref 70–100)
BKR POTASSIUM: 4.8 mmol/L (ref 3.3–5.3)
BKR PROTEIN TOTAL: 8.1 g/dL (ref 6.6–8.7)
BKR SODIUM: 131 mmol/L — ABNORMAL LOW (ref 136–144)

## 2022-04-14 LAB — CBC WITH AUTO DIFFERENTIAL
BKR WAM ABSOLUTE IMMATURE GRANULOCYTES.: 0.02 x 1000/ÂµL (ref 0.00–0.30)
BKR WAM ABSOLUTE LYMPHOCYTE COUNT.: 2.08 x 1000/ÂµL (ref 0.60–3.70)
BKR WAM ABSOLUTE NRBC (2 DEC): 0 x 1000/ÂµL (ref 0.00–1.00)
BKR WAM ANALYZER ANC: 3.08 x 1000/ÂµL (ref 2.00–7.60)
BKR WAM BASOPHIL ABSOLUTE COUNT.: 0.04 x 1000/ÂµL (ref 0.00–1.00)
BKR WAM BASOPHILS: 0.6 % (ref 0.0–1.4)
BKR WAM EOSINOPHIL ABSOLUTE COUNT.: 0.4 x 1000/ÂµL (ref 0.00–1.00)
BKR WAM EOSINOPHILS: 6.4 % — ABNORMAL HIGH (ref 0.0–5.0)
BKR WAM HEMATOCRIT (2 DEC): 35.1 % (ref 35.00–45.00)
BKR WAM HEMOGLOBIN: 11 g/dL — ABNORMAL LOW (ref 11.7–15.5)
BKR WAM IMMATURE GRANULOCYTES: 0.3 % (ref 0.0–1.0)
BKR WAM LYMPHOCYTES: 33.1 % (ref 17.0–50.0)
BKR WAM MCH (PG): 26.5 pg — ABNORMAL LOW (ref 27.0–33.0)
BKR WAM MCHC: 31.3 g/dL (ref 31.0–36.0)
BKR WAM MCV: 84.6 fL (ref 80.0–100.0)
BKR WAM MONOCYTE ABSOLUTE COUNT.: 0.67 x 1000/ÂµL (ref 0.00–1.00)
BKR WAM MONOCYTES: 10.7 % (ref 4.0–12.0)
BKR WAM MPV: 10.4 fL (ref 8.0–12.0)
BKR WAM NEUTROPHILS: 48.9 % (ref 39.0–72.0)
BKR WAM NUCLEATED RED BLOOD CELLS: 0 % (ref 0.0–1.0)
BKR WAM PLATELETS: 460 x1000/ÂµL — ABNORMAL HIGH (ref 150–420)
BKR WAM RDW-CV: 16.2 % — ABNORMAL HIGH (ref 11.0–15.0)
BKR WAM RED BLOOD CELL COUNT.: 4.15 M/ÂµL (ref 4.00–6.00)
BKR WAM WHITE BLOOD CELL COUNT: 6.3 x1000/ÂµL (ref 4.0–11.0)

## 2022-04-14 LAB — MAGNESIUM: BKR MAGNESIUM: 1.9 mg/dL (ref 1.7–2.4)

## 2022-04-14 MED ORDER — SODIUM CHLORIDE 0.9 % BOLUS (NEW BAG)
0.9 % | Freq: Once | INTRAVENOUS | Status: CP
Start: 2022-04-14 — End: ?
  Administered 2022-04-14: 16:00:00 0.9 mL/h via INTRAVENOUS

## 2022-04-14 NOTE — Progress Notes
[facility] Progress NoteAttending Provider: Pryor Ochoa, MD Subjective                                                                              Subjective: Interim History: Patient seen this AM. Denied any complaints. Review of Allergies/Meds/Hx: Review of Allergies/Meds/Hx:I have reviewed the patient's: allergies, current scheduled medications, current infusions, current prn medications and past medical history Objective Objective: Vitals:I have reviewed the patient's current vital signs as documented in the patient's EMR.   and Last 24 hours: Temp:  [98 ?F (36.7 ?C)-98.5 ?F (36.9 ?C)] 98 ?F (36.7 ?C)Pulse:  [74-101] 96Resp:  [18-20] 20BP: (113-127)/(74-81) 127/81SpO2:  [94 %-99 %] 99 %I/O's:I have reviewed the patient's current I&O's as documented in the EMR.Gross Totals (Last 24 hours) at 04/14/2022 1712Last data filed at 04/14/2022 1536Intake 1532 ml Output 1875 ml Net -343 ml Procedures:None Physical Exam Constitutional: oriented to person, and no acute distress.Marland KitchenHEENT: Head: NCAT. Eyes: EOMI. PERRL. Neck: Neck supple. Cardiovascular: RRRS1S2. ?No murmursPulmonary/Chest: BS +. No crackles. No wheezes.Abdominal: Soft. BS +. Non-tender, Non-distended. Ileostomy-no blood. PEG site c/d/i.GU: PurewickMusculoskeletal: Normal ROM. Neurological: alert and oriented to person. Non focal--BLE weakness & slight drop foot.Extremities: BLE + edema. Skin: No rashesLabs:I have reviewed the patient's labs within the last 24 hrs.Last 24 hours: Recent Results (from the past 24 hour(s)) Comprehensive metabolic panel  Collection Time: 04/14/22  4:34 AM Result Value Ref Range  Sodium 131 (L) 136 - 144 mmol/L  Potassium 4.8 3.3 - 5.3 mmol/L  Chloride 105 98 - 107 mmol/L  CO2 16 (L) 20 - 30 mmol/L  Anion Gap 10 7 - 17  Glucose 91 70 - 100 mg/dL  BUN 11 8 - 23 mg/dL Creatinine 1.61 (L) 0.96 - 1.30 mg/dL  Calcium 04.5 (H) 8.8 - 10.2 mg/dL  BUN/Creatinine Ratio 40.9 (H) 8.0 - 23.0  Total Protein 8.1 6.6 - 8.7 g/dL  Albumin 2.9 (L) 3.6 - 4.9 g/dL  Total Bilirubin 0.2 <=8.1 mg/dL  Alkaline Phosphatase 75 9 - 122 U/L  Alanine Aminotransferase (ALT) 15 10 - 35 U/L  Aspartate Aminotransferase (AST) 43 (H) 10 - 35 U/L  Globulin 5.2 (H) 2.3 - 3.5 g/dL  A/G Ratio 0.6 (L) 1.0 - 2.2  AST/ALT Ratio 2.9 See Comment  eGFR (Creatinine) >60 >=60 mL/min/1.10m2 CBC auto differential  Collection Time: 04/14/22  4:34 AM Result Value Ref Range  WBC 6.3 4.0 - 11.0 x1000/?L  RBC 4.15 4.00 - 6.00 M/?L  Hemoglobin 11.0 (L) 11.7 - 15.5 g/dL  Hematocrit 19.14 78.29 - 45.00 %  MCV 84.6 80.0 - 100.0 fL  MCH 26.5 (L) 27.0 - 33.0 pg  MCHC 31.3 31.0 - 36.0 g/dL  RDW-CV 56.2 (H) 13.0 - 15.0 %  Platelets 460 (H) 150 - 420 x1000/?L  MPV 10.4 8.0 - 12.0 fL  Neutrophils 48.9 39.0 - 72.0 %  Lymphocytes 33.1 17.0 - 50.0 %  Monocytes 10.7 4.0 - 12.0 %  Eosinophils 6.4 (H) 0.0 - 5.0 %  Basophil 0.6 0.0 - 1.4 %  Immature Granulocytes 0.3 0.0 - 1.0 %  nRBC 0.0 0.0 - 1.0 %  ANC(Abs Neutrophil Count) 3.08 2.00 - 7.60 x 1000/?L  Absolute Lymphocyte Count  2.08 0.60 - 3.70 x 1000/?L  Monocyte Absolute Count 0.67 0.00 - 1.00 x 1000/?L  Eosinophil Absolute Count 0.40 0.00 - 1.00 x 1000/?L  Basophil Absolute Count 0.04 0.00 - 1.00 x 1000/?L  Absolute Immature Granulocyte Count 0.02 0.00 - 0.30 x 1000/?L  Absolute nRBC 0.00 0.00 - 1.00 x 1000/?L Magnesium  Collection Time: 04/14/22  4:34 AM Result Value Ref Range  Magnesium 1.9 1.7 - 2.4 mg/dL Diagnostics:No new radiology.ECG/Tele Events: No ECG ordered today Assessment Assessment: Assessment:66 y.o. female with PMH of RA, recent prolonged hospitalization for perforated diverticulitis (s/p ex Lap/R hemicolectomy with end ileostomy). Admitted 2/24 for LTAC placement and persistent encephalopathy. Found to be febrile without clear infectious source found, fevers resolved. Extensive work-up of encephalopathy and leg weakness unremarkable. Course c/b elevated LFTs which resolved, likely 2/2 DILI from ceftriaxone or Zosyn. Stable and awaiting T19/LTC placement, but with ongoing significant hypercalcemia.?Mental status appears improved.Principal Problem:  Fever, unspecified fever cause  SNOMED [place](R): FEVER   Plan Plan: # Hx Recurrent Fever/R-foot Cellulitis:-transient fever resolved--last fever 04/05/22, no leukocytosis-PCT decreased 5.95--> 2.08-No clear source identified, possibly 2/2 DVT vs autoimmune?-BCx-04/02/22--NGTD-S/p CTX 2/21 and Zosyn 2/21-2/25-CXR-no acute pathology-prior UA-benign-completed ABX-Oxacillin-consulted Podiatry--appreciated recs -trend fever/CBC prn-Tylenol prn?# Hypercalcemia:-Acute on?chronic--resolved-PTH normal (28);?work-up including PTH-rP (9), vitamin D testing, TSH, random cortisol unrevealing-Possibly d/t immobility or primary hyperparathyroidism; -Endocrine consulted -s/p Zolendronic acid 4 mg X1 (3/21)-fasting CTx (Collagen Type-1 Telopeptide)-elevated?# Acute encephalopathy: improved LE weakness:-Unclear etiology, possibly prolonged delirium iso hospitalization from Nov 2022 - currentMRI brain, MRI total spine unremarkable-LP unremarkable-Paraneoplastic and autoimmune encephalopathy panels negative-Overall somewhat improved cognitive status during admission, remains alert and oriented x 1-2-Neurology rec'd EMG to complete work-up of LE weakness but pt declined; Neurology does not recommend any further testing and signed off 02/26/22-c/w Gabapentin 100 mg QHS -BLE US-no DVT??# Perforated diverticulum: -s/p R hemicolectomy, end ileostomy/LFT elevation-c/b fluid abscess requiring drainage during Nov 2022 admission in [place]. [place]-?. 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?on this admission?now [place] scan shows improved Peritoneal nodules?Per 3/30 notes, seems imaging was reviewed and that nodules were improving and thought to be resolution of continued debris.-Work-up showed elevated IgG, smooth muscle antibodies c/f possible autoimmune hepatitisInitial concern that imaging showed peritoneal nodules ?carcinomatosis-On review of imaging, nodules improving, more likely to be continued resolution of debris from perforation-Liver tests resolved without intervention, so most likely DILI from antibiotics (ceftriaxone and Zosyn)?# R lateral foot and L medial eminence deep tissue injuries:-Stable-Xray feet with degenerative changes but no acute findings-?Podiatry following - no dressing requirements, WBAT to BLE.No?indication for podiatric surgical intervention at this time. ?Patient may follow-up with Dr. Gazes within 3-4 weeks of discharge.?-?No need for abx at this time --BLE US-no DVT-tramadol 25 mg q12h, prn for pain?# Hx Bilateral DVT:- Apixaban 5mg  BID--restarted-dc'd Lovenox 80 mg q12H?# Iron deficiency anemia:-Hematology consult &??signed off - elevated FLC ratio felt to be reactive, plasma cell disorder unlikely ?# Chronic conditions:-Rheumatoid Arthritis - has been off of MTX since November/acute illness, rheumatology signing off current ams/weakness not felt to be related to her RA, follow up outpatient -Bilateral LE DVT - ?provoked from recent surgery, cont lovenox, can resume apixaban at discharge? # Dysphagia:-resolved-s/p  TF + FWF-SLP consulted--for re-evluation-aspiration precautions?# GOC and Capacity:- during hospitalization November 2022 with perforated diverticulum, deemed at this time not to have capacity. PEG was ultimately placed and TFs were initiated. She also cleared for non modified regular diet. T19 and conservatorship pending at this time.  Electronically Signed:Spenser Harren Westley Hummer, MD Beeper 04/14/2022, 5:07 PM

## 2022-04-14 NOTE — Plan of Care
Inpatient Physical Therapy Progress Note IP Adult PT Eval/Treat - 04/14/22 0936    Date of Visit / Treatment  Date of Visit / Treatment 04/14/22   Note Type Progress Note   Progress Report Due 04/30/22   End Time 0936    General Information  Subjective Yeah, it's pain.   General Observations Pt received supine in bed; on RA; NAD; ostomy; B podus boots; RN cleared   Precautions/Limitations Fall Precautions;Bed alarm;Chair alarm   Precautions/Limitations Comment Aspiration precaution    Weight Bearing Status  Weight Bearing Status Comments WBAT BLE    Vital Signs and Orthostatic Vital Signs  Vital Signs Vital Signs Stable   Vital Signs Free text RA; NAD    Pain/Comfort  Pain Comment (Pre/Post Treatment Pain) Pt c/o unquantified primarily L heel pain w/ movement and light touch; RN aware and repositioned for comfort    Patient Coping  Observed Emotional State accepting;cooperative   Verbalized Emotional State acceptance    Cognition  Overall Cognitive Status Impaired   Orientation Level Oriented to person;Oriented to time   Level of Consciousness alert;confused   Following Commands Follows one step commands with increased time;Follows one step commands with repetition   Personal Safety / Judgment Fall risk;Requires supervision with mobility   Attention Requires redirection   Cognition Comments Pleasant; requires motivation at times    Range of Motion  Range of Motion Comments Decreased B shld AROM; tight B HM/gastrocs    Manual Muscle Testing  Manual Muscle Testing Comments + Deconditioned    Skin Assessment  Skin Assessment See Nursing Documentation    Balance  Sitting Balance: Static  FAIR-      Contact Guard to maintain static position with no Assistive Device   Sitting Balance: Dynamic  POOR+   Moves through 1/2 range with minimal assist to right self   Standing Balance: Static POOR-     Maximal assist to maintain static position with no Assistive Device   Standing Balance: Dynamic  POOR-    Unable to move from midline voluntarily, maximal assist to right self   Balance Assist Device Hand held assist   Balance Skills Training Comment Ax1    Bed Mobility  Supine-to-Sit Independence/Assistance Level Maximum assist;Assist of 2   Supine-to-Sit Assist Device Hand held assist;Head of bed elevated;Bed rails;Draw pad   Sit-to-Supine Independence/Assistance Level Maximum assist;Assist of 2   Sit-to-Supine Assist Device Head of bed elevated;Hand held assist;Draw pad   Limitations decreased ability to use legs for bridging/pushing   Bed Mobility, Impairments ROM decreased;pain;sensory feedback impaired;strength decreased   Bed mobility was limited by: impaired motor function;pain   Bed Mobility Comments Overt LOB seated EOB; VCs and TCs to correct    Sit-Stand Transfer Training  Sit-to-Stand Transfer Independence/Assistance Level Maximum assist;Assist of 2   Sit-to-Stand Transfer Assist Device Hand held assist   Stand-to-Sit Transfer Independence/Assistance Level Maximum assist;Assist of 2   Stand-to-Sit Transfer Assist Device Hand held assist   Transfer Safety Analysis Concerns cues for hand placement;decreased weight-shifting ability   Transfer Safety Analysis Impairments impaired balance;pain;decreased ROM;decreased strength   Transfers were limited by: impaired motor function;pain   Sit-Stand Transfer Comments B knee block; unable to achieve full upright despite VCs; c/o pain upon standing unable to pinpoint where    Gait Training  Gait Training Comments NT d/t weakness and unsteadiness    Handoff Documentation  Handoff Patient in bed;Bed alarm;Patient instructed to call nursing for mobility;Discussed with nursing    Endurance  Endurance Comments  Fair-    PT- AM-PAC - Basic Mobility Screen- How much help from another person do you currently need..... Turning from your back to your side while in a a flat bed without using rails? 1 - Total - Requires total assistance or cannot do it at all.   Moving from lying on your back to sitting on the side of a flat bed without using bed rails? 1 - Total - Requires total assistance or cannot do it at all.   Moving to and from a bed to a chair (including a wheelchair)? 1 - Total - Requires total assistance or cannot do it at all.   Standing up from a chair using your arms(e.g., wheelchair or bedside chair)? 1 - Total - Requires total assistance or cannot do it at all.   To walk in a hospital room? 1 - Total - Requires total assistance or cannot do it at all.   Climbing 3-5 steps with a railing? 1 - Total - Requires total assistance or cannot do it at all.   AMPAC Mobility Score 6   TARGET Highest Level of Mobility Mobility Level 2, Turn self in bed/bed activity/dependent transfer    ACTUAL Highest Level of Mobility Mobility Level 3, Sit on edge of bed    Therapeutic Exercise  Therapeutic Exercise Comments AROM x 4; AAROM BLE    Neuromuscular Re-education  Neuromuscular Re-education Comments Seated postural control training    Therapeutic Functional Activity  Therapeutic Functional Activity Comments Bed mobility; transfer training; Pt edu    Clinical Impression  Follow up Assessment Pt seen for PT f/u. Requires maxAx2 for bed mobility and STS w/ HHA. Displays retropulsion and lateral lean seated EOB while performing AAROM/AROM to BLE. VCs and TCs for upright. Would benefit from skilled services to improve gross functional ROM/strength, postural control, and activity tolerance. Current PT d/c rec is STR.   Criteria for Skilled Therapeutic Interventions Met yes    Patient/Family Stated Goals  Patient/Family Stated Goal(s) decrease pain;feel better    Frequency/Equipment Recommendations  PT Frequency 2x per week   What day of week is next treatment expected? Thursday   4/27 PT/PTA completing this assessment RL   Equipment Needs During Admission/Treatment No device    PT Recommendations for Inpatient Admission  Activity/Level of Assist dangle edge of bed;assist of 2   lift oob   Planned Treatment / Interventions  Plan for Next Visit Progress as tol   Training Treatment / Interventions Balance / Investment banker, operational;Endurance Training;Functional Personnel officer / Interventions Patient Education / Training    PT Discharge Summary  Physical Therapy Disposition Recommendation Short Term Rehab     Shanon Payor PT, [place]: [phone]

## 2022-04-14 NOTE — Plan of Care
Plan of Care Overview/ Patient Status    Patient alert and orientated to self. Flat affect, calm, cooperative. Can occasionally participate in a simple, brief conversation. On room air. Abdomen disfigured with surgical scarring. Bilateral ostomies in place. Right side ileostomy draining stool without difficulty. Left ileostomy without output or gas. PEG tube in place to LUQ with ordered intermittent bolus feedings of Jevity per order. Patient tolerated feeding this shift without complaint any any abnormal s/s. Refused any oral intake this shift, continue to encourage. Purewick in place to assist with elimination d/t incontinence. Bilateral foot drop. VSS. In NAD. Pending collection of morning labs. Turn and position every 2 hours during shift. Safety precautions maintained throughout shift with hourly rounding. Current d/c plan: Pending T19 for LTAC placement near family. Problem: Adult Inpatient Plan of CareGoal: Plan of Care ReviewOutcome: Interventions implemented as appropriateGoal: Patient-Specific Goal (Individualized)Outcome: Interventions implemented as appropriateGoal: Absence of Hospital-Acquired Illness or InjuryOutcome: Interventions implemented as appropriateGoal: Optimal Comfort and WellbeingOutcome: Interventions implemented as appropriateGoal: Readiness for Transition of CareOutcome: Interventions implemented as appropriate Problem: Fall Injury RiskGoal: Absence of Fall and Fall-Related InjuryOutcome: Interventions implemented as appropriate Problem: Skin Injury Risk IncreasedGoal: Skin Health and IntegrityOutcome: Interventions implemented as appropriate Problem: Mobility ImpairmentGoal: Optimal MobilityOutcome: Interventions implemented as appropriate Problem: Aspiration (Enteral Nutrition)Goal: Absence of Aspiration Signs and SymptomsOutcome: Interventions implemented as appropriate Problem: Device-Related Complication Risk (Enteral Nutrition)Goal: Safe, Effective Therapy DeliveryOutcome: Interventions implemented as appropriate Problem: Confusion AcuteGoal: Optimal Cognitive FunctionOutcome: Interventions implemented as appropriate Problem: Self-Care DeficitGoal: Improved Ability to Complete Activities of Daily LivingOutcome: Interventions implemented as appropriate Problem: Fluid, Electrolyte and Nutrition Imbalance (Ileostomy)Goal: Fluid, Electrolyte and Nutrition BalanceOutcome: Interventions implemented as appropriate Problem: Infection (Ileostomy)Goal: Absence of Infection Signs and SymptomsOutcome: Interventions implemented as appropriate

## 2022-04-14 NOTE — Plan of Care
Plan of Care Overview/ Patient Status    Problem: Adult Inpatient Plan of CareGoal: Readiness for Transition of CareOutcome: Interventions implemented as appropriate LOS 62 DaysPt's sister is awaiting conservator hearing date information to being LTC search. Elinor Parkinson, RN, MSNCare ManagerMHB: [phone]

## 2022-04-14 NOTE — Plan of Care
Plan of Care Overview/ Patient Status    Medical Social Work Follow Up  [employer] Most Recent Value Admission Information  Document Type Progress Note Reason for Inability to Assess --  [Patient mental status is altered] (For Inpatient/ED Only) Prior psychosocial assessment has been documented within this hospitalization No (For Inpatient/ED Only) Prior psychosocial assessment has been documented within 30 days of this hospitalization No Reason for Current Social Work Involvement Acupuncturist Medical Team Comment Medical Team Community Agency Comment [place] [facility] [facility] Record Reviewed Yes Level of Care Inpatient Psychosocial issues requiring intervention Conservatorship Psychosocial interventions 5 minutes spent on the phone with [employer]. I attempted several times to reach a clerk in the [employer], but was unsuccessful. I contacted [name] who reported that she had not heard anything from them either and would follow-up tomorrow. I thanked her for her time. Collaborations [employer] Specific referrals to enhance community supports (include existing and new resources) None Handoff Required? No Next Steps/Plan (including hand-off): I will remain involved for support as needed. Signature: Leim Fabry, LCSW Contact Information: [phone]

## 2022-04-15 LAB — CBC WITHOUT DIFFERENTIAL
BKR WAM ANALYZER ANC: 3.03 x 1000/ÂµL (ref 2.00–7.60)
BKR WAM HEMATOCRIT (2 DEC): 33 % — ABNORMAL LOW (ref 35.00–45.00)
BKR WAM HEMOGLOBIN: 10.3 g/dL — ABNORMAL LOW (ref 11.7–15.5)
BKR WAM MCH (PG): 27.1 pg (ref 27.0–33.0)
BKR WAM MCHC: 31.2 g/dL (ref 31.0–36.0)
BKR WAM MCV: 86.8 fL (ref 80.0–100.0)
BKR WAM MPV: 9.8 fL (ref 8.0–12.0)
BKR WAM PLATELETS: 421 x1000/ÂµL — ABNORMAL HIGH (ref 150–420)
BKR WAM RDW-CV: 16 % — ABNORMAL HIGH (ref 11.0–15.0)
BKR WAM RED BLOOD CELL COUNT.: 3.8 M/ÂµL — ABNORMAL LOW (ref 4.00–6.00)
BKR WAM WHITE BLOOD CELL COUNT: 6.3 x1000/ÂµL (ref 4.0–11.0)

## 2022-04-15 LAB — BASIC METABOLIC PANEL
BKR ANION GAP: 6 — ABNORMAL LOW (ref 7–17)
BKR BLOOD UREA NITROGEN: 12 mg/dL (ref 8–23)
BKR BUN / CREAT RATIO: 36.4 — ABNORMAL HIGH (ref 8.0–23.0)
BKR CALCIUM: 9.8 mg/dL (ref 8.8–10.2)
BKR CHLORIDE: 107 mmol/L (ref 98–107)
BKR CO2: 24 mmol/L (ref 20–30)
BKR CREATININE: 0.33 mg/dL — ABNORMAL LOW (ref 0.40–1.30)
BKR EGFR, CREATININE (CKD-EPI 2021): >60 mL/min/{1.73_m2} (ref >=60)
BKR GLUCOSE: 94 mg/dL (ref 70–100)
BKR POTASSIUM: 4.1 mmol/L (ref 3.3–5.3)
BKR SODIUM: 137 mmol/L (ref 136–144)

## 2022-04-15 LAB — ALBUMIN: BKR ALBUMIN: 3.4 g/dL — ABNORMAL LOW (ref 3.6–4.9)

## 2022-04-15 NOTE — Progress Notes
[facility] Medicine Progress NoteAttending Provider: Pryor Ochoa, MD Subjective                                                                              Subjective: Interim History: Patient seen this AM. Denied any complaints. Review of Allergies/Meds/Hx: Review of Allergies/Meds/Hx:I have reviewed the patient's: allergies, current scheduled medications, current infusions, current prn medications and past medical history Objective Objective: Vitals:I have reviewed the patient's current vital signs as documented in the patient's EMR.   and Last 24 hours: Temp:  [97.4 ?F (36.3 ?C)-99.3 ?F (37.4 ?C)] 98.2 ?F (36.8 ?C)Pulse:  [73-96] 94Resp:  [17-20] 18BP: (98-127)/(63-82) 98/63SpO2:  [96 %-99 %] 97 %I/O's:I have reviewed the patient's current I&O's as documented in the EMR.Gross Totals (Last 24 hours) at 04/15/2022 1519Last data filed at 04/15/2022 0339Intake 986 ml Output 1740 ml Net -754 ml Procedures:None Physical Exam Constitutional: oriented to person, and no acute distress.Marland KitchenHEENT: Head: NCAT. Eyes: EOMI. PERRL. Neck: Neck supple. Cardiovascular: RRRS1S2. ?No murmursPulmonary/Chest: BS +. No crackles. No wheezes.Abdominal: Soft. BS +. Non-tender, Non-distended. Ileostomy-no blood. PEG site c/d/i.GU: PurewickMusculoskeletal: Normal ROM. Neurological: alert and oriented to person. Non focal--BLE weakness & slight drop foot.Extremities: BLE + edema. Skin: No rashesLabs:I have reviewed the patient's labs within the last 24 hrs.Last 24 hours: Recent Results (from the past 24 hour(s)) Basic metabolic panel  Collection Time: 04/15/22  3:35 AM Result Value Ref Range  Sodium 137 136 - 144 mmol/L  Potassium 4.1 3.3 - 5.3 mmol/L  Chloride 107 98 - 107 mmol/L  CO2 24 20 - 30 mmol/L  Anion Gap 6 (L) 7 - 17  Glucose 94 70 - 100 mg/dL  BUN 12 8 - 23 mg/dL  Creatinine 1.61 (L) 0.96 - 1.30 mg/dL  Calcium 9.8 8.8 - 04.5 mg/dL  BUN/Creatinine Ratio 40.9 (H) 8.0 - 23.0  eGFR (Creatinine) >60 >=60 mL/min/1.65m2 CBC without differential  Collection Time: 04/15/22  3:35 AM Result Value Ref Range  WBC 6.3 4.0 - 11.0 x1000/?L  RBC 3.80 (L) 4.00 - 6.00 M/?L  Hemoglobin 10.3 (L) 11.7 - 15.5 g/dL  Hematocrit 81.19 (L) 14.78 - 45.00 %  MCV 86.8 80.0 - 100.0 fL  MCH 27.1 27.0 - 33.0 pg  MCHC 31.2 31.0 - 36.0 g/dL  RDW-CV 29.5 (H) 62.1 - 15.0 %  Platelets 421 (H) 150 - 420 x1000/?L  MPV 9.8 8.0 - 12.0 fL  ANC(Abs Neutrophil Count) 3.03 2.00 - 7.60 x 1000/?L Albumin  Collection Time: 04/15/22  3:35 AM Result Value Ref Range  Albumin 3.4 (L) 3.6 - 4.9 g/dL Diagnostics:No new radiology.ECG/Tele Events: No ECG ordered today Assessment Assessment: Assessment:66 y.o. female with PMH of RA, recent prolonged hospitalization for perforated diverticulitis (s/p ex Lap/R hemicolectomy with end ileostomy). Admitted 2/24 for LTAC placement and persistent encephalopathy. Found to be febrile without clear infectious source found, fevers resolved. Extensive work-up of encephalopathy and leg weakness unremarkable. Course c/b elevated LFTs which resolved, likely 2/2 DILI from ceftriaxone or Zosyn. Stable and awaiting T19/LTC placement, but with ongoing significant hypercalcemia.?Mental status appears improved.Principal Problem:  Fever, unspecified fever cause  SNOMED Capron(R): FEVER   Plan Plan: # Hx Recurrent Fever/R-foot Cellulitis: improved. -transient fever resolved--last  fever 04/05/22, no leukocytosis- PCT decreased 5.95--> 2.08-BCx-04/02/22--NGTD-S/p CTX 2/21 and Zosyn 2/21-2/25-completed ABX-Oxacillin-consulted Podiatry--appreciated recs -Tylenol prn?# Hypercalcemia: improving -Acute on?chronic--resolved-PTH normal (28);?work-up including PTH-rP (9), vitamin D testing, TSH, random cortisol unrevealing-Possibly d/t immobility or primary hyperparathyroidism; -Endocrine consulted -s/p Zolendronic acid 4 mg X1 (3/21)-fasting CTx (Collagen Type-1 Telopeptide)-elevated?# Acute encephalopathy: improved LE weakness:-Unclear etiology, possibly prolonged delirium iso hospitalization from Nov 2022 - currentMRI brain, MRI total spine unremarkable-LP unremarkable-Paraneoplastic and autoimmune encephalopathy panels negative-Overall somewhat improved cognitive status during admission, remains alert and oriented x 1-2-Neurology rec'd EMG to complete work-up of LE weakness but pt declined; Neurology does not recommend any further testing and signed off 02/26/22-c/w Gabapentin 100 mg QHS -BLE US-no DVT??# Perforated diverticulum: -s/p R hemicolectomy, end ileostomy/LFT elevation-c/b fluid abscess requiring drainage during Nov 2022 admission 66 y.o. female with PMH of RA, recent prolonged hospitalization for perforated diverticulitis (s/p ex Lap/R hemicolectomy with end ileostomy). Admitted 2/24 for LTAC placement and persistent encephalopathy. Found to be febrile without clear infectious source found, fevers resolved. Extensive work-up of encephalopathy and leg weakness unremarkable. Course c/b elevated LFTs which resolved, likely 2/2 DILI from ceftriaxone or Zosyn. Stable and awaiting T19/LTC placement, but with ongoing significant hypercalcemia.?Mental status appears improved.Principal Problem:  Fever, unspecified fever cause  SNOMED Capron(R): FEVER   Plan Plan: # Hx Recurrent Fever/R-foot Cellulitis: improved. -transient fever resolved--last  fever 04/05/22, no leukocytosis- PCT decreased 5.95--> 2.08-BCx-04/02/22--NGTD-S/p CTX 2/21 and Zosyn 2/21-2/25-completed ABX-Oxacillin-consulted Podiatry--appreciated recs -Tylenol prn?# Hypercalcemia: improving -Acute on?chronic--resolved-PTH normal (28);?work-up including PTH-rP (9), vitamin D testing, TSH, random cortisol unrevealing-Possibly d	 and thought to be resolution of continued debris.-Work-up showed elevated IgG, smooth muscle antibodies c/f possible autoimmune hepatitisInitial concern that imaging showed peritoneal nodules ?carcinomatosis-On review of imaging, nodules improving, more likely to be continued resolution of debris from perforation-Liver tests resolved without intervention, so most likely DILI from antibiotics (ceftriaxone and Zosyn)?# R lateral foot and L medial eminence deep tissue injuries:-Stable-Xray feet with degenerative changes but no acute findings-?Podiatry following - no dressing requirements, WBAT to BLE.No?indication for podiatric surgical intervention at this time. ?Patient may follow-up with Dr. Gazes within 3-4 weeks of discharge.?-?No need for abx at this time --BLE US-no DVT-tramadol 25 mg q12h, prn for pain?# Hx Bilateral DVT:- Apixaban 5mg  BID--restarted-dc'd Lovenox 80 mg q12H?# Iron deficiency anemia:-Hematology consult &??signed off - elevated FLC ratio felt to be reactive, plasma cell disorder unlikely ?# Chronic conditions:-Rheumatoid Arthritis - has been off of MTX since November/acute illness, rheumatology signing off current ams/weakness not felt to be related to her RA, follow up outpatient -Bilateral LE DVT - ?provoked from recent surgery, cont lovenox, can resume apixaban at discharge? # Dysphagia:-resolved-s/p  TF + FWF-SLP consulted--for re-evluation-aspiration precautions?# GOC and Capacity:- during hospitalization November 2022 with perforated diverticulum, deemed at this time not to have capacity. PEG was ultimately placed and TFs were initiated. She also cleared for non modified regular diet. T19 and conservatorship pending at this time.  Electronically Signed:Ellowyn Rieves Westley Hummer, MD Beeper 04/15/2022,

## 2022-04-15 NOTE — Plan of Care
Plan of Care Overview/ Patient Status    Pt A+Ox2, RA, VSS. Pt slept well overnight. Took meds crushed via G tube. Purewick in place with good UOP. Pt had good output from RLQ ileostomy. Appeared in pain with turns but no c/o pain at rest. Received 9pm bolus TF, no residuals. Heel lift boots on. No complaints/questions. Problem: Adult Inpatient Plan of CareGoal: Plan of Care ReviewOutcome: Interventions implemented as appropriateGoal: Patient-Specific Goal (Individualized)Outcome: Interventions implemented as appropriateGoal: Absence of Hospital-Acquired Illness or InjuryOutcome: Interventions implemented as appropriateGoal: Optimal Comfort and WellbeingOutcome: Interventions implemented as appropriateGoal: Readiness for Transition of CareOutcome: Interventions implemented as appropriate Problem: InfectionGoal: Absence of Infection Signs and SymptomsOutcome: Interventions implemented as appropriate

## 2022-04-15 NOTE — Plan of Care
Plan of Care Overview/ Patient Status    Pt A+OX4, Medicated per MAR, cont on bolus feed. Cont on regular diet. Incont of urine, purewick in place. Q2hr rounds AX2 with T&R. Safety and comfort maintained. WCTM.

## 2022-04-15 NOTE — Plan of Care
Adult Speech and Language PathologySwallow Progress Note4/25/2023Patient Name:  Danielle MaloneMR#:  [id number] Date of Birth:  1957-11-17Therapist:  Doloris Hall, SLP SLP IP Adult Pain/Comfort - 04/15/22 1628    Pain/Comfort  Pain Comment (Pre/Post Treatment Pain) no c/o pain   Pain Rating at Rest 0/10 - no pain      Dysphagia Treatment - 04/15/22 1628    Dysphagia/Swallow Treatment  Dysphagia Treatment Swallowing   Dysphagia Plan of Care Comments Patient seen for instrumental swallow evaluation (FEES) on 03/17/2022. Study revealed pharyngeal residue with  thin liquids in continuous sips. No visible laryngeal penetration or aspiration appreciated. Recommended diet at that time was pureed solids and thin liquids. Patient met again at bedside on 4/05. Patient was administered regular solid. Prolonged mastication appreciated. Patient requested liquid wash. Complete oral clearance was achieved. Soft/ chopped solids recommended. Patient seen again at bedside on 4/11. Mildly prolonged mastication of regular solid appreciated, however, functional w/ full oral clearance. Recommended diet at that time was regular solids and thin liquids.        Patient seen today at bedside to assess diet tolerance. Patient was reeducated on SLP recommendations results and general aspiration precautions. Patient reports no coughing/ throat clearing during meals. Patient administered the recommended consistencies. No overt s/sx of dysphagia appreciated. All questions answered by SLP. RN reeducated on SLP recommendations. SLP to sign off. See recommendations below.      SLP IP Adult Recommendations - 04/15/22 1628    Recommendations  SLP Therapy Frequency No follow up necessary      SLP IP Adult Inpatient Recommendations - 04/15/22 1628    SLP Recommendations for Inpatient Admission  Swallow Recommendations NWG:NFAOZHY solids and thin liquids. Meds whole w/ thin liquids as tolerated. Strict daily oral care. SLP to sign off at this time. Team can reconsult given concern for diet tolerance/ change in overall clinical status.      SLP IP Adult Discharge Summary - 04/15/22 1628    Discharge Summary  Disposition Recommendation(s) No follow up therapy needs     Doloris Hall, M.S., CF-SLPSpeech-Language PathologistMHB: [phone]/25/2023 Problem: Speech Language Pathology GoalsGoal: Dysphagia Goals, SLPDescription: Current goal: Patient will tolerate regular solids and thin liquids without adverse impact on respiratory status and while maintaining adequate oral nutrition.Prior goal (met): The patient will tolerate least restrictive diet without any decline in respiratory status.Prior goal (met): Pt will tolerate pureed diet with thin liquids w/o s/x of aspiration or adverse impact on respiratory status.Outcome: Interventions implemented as appropriate

## 2022-04-16 NOTE — Plan of Care
Plan of Care Overview/ Patient Status    Medical Social Work Follow Up  [employer] Most Recent Value Admission Information  Document Type Progress Note Reason for Inability to Assess --  [Patient mental status is altered] (For Inpatient/ED Only) Prior psychosocial assessment has been documented within this hospitalization No (For Inpatient/ED Only) Prior psychosocial assessment has been documented within 30 days of this hospitalization No Reason for Current Social Work Involvement Therapist, occupational, Cabin crew Medical Team Comment Medical Team, Complex Care Management, Care Management Community Agency Comment [place] [facility] [facility] Record Reviewed Yes Level of Care Inpatient Psychosocial issues requiring intervention Conservatorship Psychosocial interventions 10 minutes spent on the phone with [employer] Terrall Laity. Aram Beecham provided me via e-mail notification of court hearing for conservatorship set for May 01, 2022 at 11:15 via teleconference. She reported that she made arrangements with [facility] to serve Anyra in hand within the next day or so and would be providing my information for him to contact me to ensure that he is able to enter the hospital. I thanked Aram Beecham for the information. Documents sumitted to medical records to be uploaded to the chart. Collaborations [employer] Specific referrals to enhance community supports (include existing and new resources) None Handoff Required? No Next Steps/Plan (including hand-off): I will remain involved for support as needed. Signature: Leim Fabry, LCSW Contact Information: [phone]

## 2022-04-16 NOTE — Plan of Care
[name]					Location: EP [id number]/[id number] y.o., female				Attending: Pryor Ochoa, MD	Admit Date: 02/11/2022			[id number] LOS: 64 days FOLLOW-UP NUTRITION ASSESSMENT Dietary Orders (From admission, onward)     Start     Ordered  04/03/22 1538  Diet Tube Feed With Tray  (Diet Tube Feed Panel)  DIET EFFECTIVE NOW      Comments: JEVITY 1.5 @ 1.5 carton bolus BID (@9am ,1pm) and 1 carton BID (5pm, 9pm) to total 5 cartons dailyImportant:  Unrestricted diet will be sent unless otherwise specified below. Question Answer Comment Diet Selection? Regular  Safety Tray? (Suicide/Homicide/Police Custody) Safety Tray - No  Adult Tube Feed Formulas: Jevity 1.5 (YNH,SRC)  Feeding Route: G-Tube  Continuous Rate ml/hr: Other(see comments)  Initiate Nutrition Management Protocol (Yes/No?) Yes - Initiate Protocol    04/03/22 1539    No Known AllergiesANTHROPOMETRICS:Height:???70Admit wt:?no recent wt to assess.?Current wt:?73.6 kg (4/22) stable from 72.7 kg (4/19 , bed) BMI:?23.2??(based on?current wt)?Admit wt trends:?04/02/22 0829 74.6 kg Bed 03/31/22 0809 72.7 kg Bed 03/29/22 0825 73.8 kg Bed 03/28/22 0752 73 kg Bed 03/27/22 0739 73.2 kg Bed 03/26/22 0803 71.3 kg Bed 03/25/22 0826 71.5 kg Bed 03/22/22 0804 71 kg Bed 03/20/22 0812 72.6 kg Bed 03/19/22 0824 72.4 kg Bed ?Dosing Wt:?73?kg (based on?current wt trends and normal BMI)Additional wt information:Ht confirmed with pt.?Pt last known wt was 98.6 kg (12/13/2018).?Wt loss?confirmed with admit wt?as pt no longer weighs?98 kg.?Time line for wt loss in not clear and pt cannot report.?1+ trace edema?b/l LE?per flowsheets.?Noted wt loss 3/29-4/1, suspect d/t inadequate oral intake and stopping tube feed. Wt back to adm weight since TF restarted.?Wt:03/20/22 ???72.6 kg????????????Bed03/29/23 ???72.4 kg????????????Bed12/23/19????98.6 kg?ESTIMATED NUTRITION REQUIREMENTS:Kcal/day:?1825??????????????(25 ?kcal/kg)Protein/day:?73-87 ?????????????(1-1.2gm/kg)Fluids/day:?1825 mL/d?(71mL/kcal) ONLY as medical and fluid status allows and only per team discretion. Needs based on:?Dosing Wt (73?kg), maintenance.??NUTRITION ASSESSMENT:?PT EMR reviewed for follow up assessment. Labs and meds reviewed. Pt reported to have continued good tolerance to tube feed regimen. Pt SLP eval recently clears pt for regular solids/ thin liquids. Pt continues to have minimal oral intake despite advancement. Tube feeds remain appropriate to continue.  ?Visited pt,?alert to self only. Pt?receiving?meals with assistance. Pt continues to eat minimally with assistance. No need to adjust down tube feeds at this time. Pt is meeting needs between TF and PO. Pt continues to require?TF to meet the majority?nutrition needs.??Labs- ?Na 137; BUN/Cr- 36.4 from 46.7.  ?Cultural/Religious/Ethnic Needs:?Unknown??Food Allergies: NKFA??NUTRITION DIAGNOSIS:Severe malnutrition r/t depletion aeb moderate muscle and fat depletion.- continues??INTERVENTIONS/RECOMMENDATIONS:?Meals and Snacks:-?Continue Regular diet- would consider removing additional vitamin and mineral supplementations in setting of receiving full goal rate supportive feeds. Goal: po intake for comfort given support provided by?goal?volume bolus feeds, continue?Nutrition supplement:No need for oral supplements ?Enteral Nutrition:?	Consider?JEVITY 1.5?@ 1.5?carton bolus BID (@9am ,1pm)?and?1 carton?BID (5pm,?9pm)?to total 5 cartons daily-	Provides: 1.185L, 1778kcal, 76gm protein, free water-	Additional free water per team. Add'l 877 mL provides 1 mL/kcal fedD/w covering provider?Goal: ?-	Patient will receive >90% of goal volume TF/ TPN prior to next nutrition assessment-?met will continue? ??Discharge Planning and Transfer of Nutrition Care:??Nutrition related discharge needs still being determined at this time, will continue to follow ??MONITORING / EVALUATION:?Food/Nutrition-Related History:Food and Nutrient Intake?Anthropometric Measurements:Weight ?Biochemical Data, Medical Tests and Procedures:Electrolyte and renal profileGlucose/endocrine profile?Nutrition-Focus Physical FindingsOverall findingsElectronically signed by Sherrilee Gilles, RD, April 26, 2023MHB- [phone] Please note: Nutrition is a consult only service on weekends and holidays.  Please enter a consult in EPIC if assistance is needed on a weekend or holiday or via M[facility] [phone] to contact the covering RD.

## 2022-04-16 NOTE — Plan of Care
Plan of Care Overview/ Patient Status    1900-0700Pt A&O to self. Tachycardic in 100s, other VSS on RA. No signs of pain ex. during T&R. Cont. B&B, ileostomy in place, draining liquid stool, purewick in place. GJ tube in place, dressing applied. Ax2 in bed. Waffle boots in place. Safety precaution maintained. Bed alarm on. Hourly rounding performed.

## 2022-04-16 NOTE — Plan of Care
Plan of Care Overview/ Patient Status    1900-0700Pt A&Ox2, disoriented to time and situation. VSS on RA. Denies CP, SOB, N/V, numbness and tingling. Inc of urine, purewick in place. Colostomy appliance changed this shift, +stool and flatus. Pills crushed thru PEG, Jevity bolus given, tolerated well. Skin intact except some scattered bruises, heels elevated. q2h T&R. Safety precaution maintained. Bed alarm on. Hourly rounding performed.

## 2022-04-16 NOTE — Progress Notes
[facility] Medicine Progress NoteAttending Provider: Pryor Ochoa, MD Subjective                                                                              Subjective: Interim History: Patient seen this AM. Denied any complaints. No acute events overnight.  Review of Allergies/Meds/Hx: Review of Allergies/Meds/Hx:I have reviewed the patient's: allergies, current scheduled medications, current infusions, current prn medications and past medical history Objective Objective: Vitals:I have reviewed the patient's current vital signs as documented in the patient's EMR.   and Last 24 hours: Temp:  [97.9 ?F (36.6 ?C)-98.8 ?F (37.1 ?C)] 97.9 ?F (36.6 ?C)Pulse:  [73-89] 87Resp:  [16-18] 18BP: (99-129)/(64-84) 120/78SpO2:  [97 %-99 %] 99 %I/O's:I have reviewed the patient's current I&O's as documented in the EMR.Gross Totals (Last 24 hours) at 04/16/2022 1834Last data filed at 04/16/2022 0825Intake 712 ml Output 400 ml Net 312 ml Procedures:None Physical Exam Constitutional: oriented to person, and no acute distress.Marland KitchenHEENT: Head: NCAT. Eyes: EOMI. PERRL. Neck: Neck supple. Cardiovascular: RRRS1S2. ?No murmursPulmonary/Chest: BS +. No crackles. No wheezes.Abdominal: Soft. BS +. Non-tender, Non-distended. Ileostomy-no blood. PEG site c/d/i.GU: PurewickMusculoskeletal: Normal ROM. Neurological: alert and oriented to person. Non focal--BLE weakness & slight drop foot.Extremities: BLE + edema. Skin: No rashesLabs:I have reviewed the patient's labs within the last 24 hrs.Last 24 hours: No results found for this or any previous visit (from the past 24 hour(s)).Diagnostics:No new radiology.ECG/Tele Events: No ECG ordered today Assessment Assessment: Assessment:65 y.o. female with PMH of RA, recent prolonged hospitalization for perforated diverticulitis (s/p ex Lap/R hemicolectomy with end ileostomy). Admitted 2/24 for LTAC placement and persistent encephalopathy. Found to be febrile without clear infectious source found, fevers resolved. Extensive work-up of encephalopathy and leg weakness unremarkable. Course c/b elevated LFTs which resolved, likely 2/2 DILI from ceftriaxone or Zosyn. Stable and awaiting T19/LTC placement, but with ongoing significant hypercalcemia.?Mental status appears improved.Principal Problem:  Fever, unspecified fever cause  SNOMED [place](R): FEVER   Plan Plan: # Hx Recurrent Fever/R-foot Cellulitis: improved. -transient fever resolved--last fever 04/05/22, no leukocytosis- PCT decreased 5.95--> 2.08-BCx-04/02/22--NGTD-S/p CTX 2/21 and Zosyn 2/21-2/25-completed ABX-Oxacillin-consulted Podiatry--appreciated recs -Tylenol prn?# Hypercalcemia: improving -Acute on?chronic--resolved-PTH normal (28);?work-up including PTH-rP (9), vitamin D testing, TSH, random cortisol unrevealing-Possibly d/t immobility or primary hyperparathyroidism; -Endocrine consulted -s/p Zolendronic acid 4 mg X1 (3/21)-fasting CTx (Collagen Type-1 Telopeptide)-elevated?# Acute encephalopathy: improved LE weakness:-Unclear etiology, possibly prolonged delirium iso hospitalization from Nov 2022 - currentMRI brain, MRI total spine unremarkable-LP unremarkable-Paraneoplastic and autoimmune encephalopathy panels negative-Overall somewhat improved cognitive status during admission, remains alert and oriented x 1-2-Neurology rec'd EMG to complete work-up of LE weakness but pt declined; Neurology does not recommend any further testing and signed off 02/26/22-c/w Gabapentin 100 mg QHS -BLE US-no DVT??# Perforated diverticulum: -s/p R hemicolectomy, end ileostomy/LFT elevation-c/b fluid abscess requiring drainage during Nov 2022 admission in [place]. [place]-?. 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?on this admission?now [place] scan shows improved Peritoneal nodules?Per 3/30 notes, seems imaging was reviewed and that nodules were improving and thought to be resolution of continued debris.-Work-up showed elevated IgG, smooth muscle antibodies c/f possible autoimmune hepatitisInitial concern that imaging showed peritoneal nodules ?carcinomatosis-On review of imaging, nodules improving, more likely to be continued  resolution of debris from perforation-Liver tests resolved without intervention, so most likely DILI from antibiotics (ceftriaxone and Zosyn)?# R lateral foot and L medial eminence deep tissue injuries:-Stable-Xray feet with degenerative changes but no acute findings-?Podiatry following - no dressing requirements, WBAT to BLE.No?indication for podiatric surgical intervention at this time. ?Patient may follow-up with Dr. Gazes within 3-4 weeks of discharge.?-?No need for abx at this time --BLE US-no DVT-tramadol 25 mg q12h, prn for pain?# Hx Bilateral DVT:- Apixaban 5mg  BID--restarted-dc'd Lovenox 80 mg q12H?# Iron deficiency anemia:-Hematology consult &??signed off - elevated FLC ratio felt to be reactive, plasma cell disorder unlikely ?# Chronic conditions:-Rheumatoid Arthritis - has been off of MTX since November/acute illness, rheumatology signing off current ams/weakness not felt to be related to her RA, follow up outpatient -Bilateral LE DVT - ?provoked from recent surgery, cont lovenox, can resume apixaban at discharge? # Dysphagia:-resolved-s/p  TF + FWF, regular diet with thin liquids -SLP consulted--for re-evluation-aspiration precautions?# GOC and Capacity:- during hospitalization November 2022 with perforated diverticulum, deemed at this time not to have capacity. PEG was ultimately placed and TFs were initiated. She also cleared for regular diet. T19 and conservatorship hearing set for 05/01/22.  Electronically Signed:Reagann Dolce Westley Hummer, MD Beeper 04/16/2022,

## 2022-04-17 NOTE — Plan of Care
Plan of Care Overview/ Patient Status    Pt A+0 to self, medicated per MAR. Not on tele, on RA. Bolus tube feed per orders. AX2 with T&R. PEG tube in place, dsg change done on my shift. Safety and comfort maintained. WCTM.

## 2022-04-17 NOTE — Plan of Care
Plan of Care Overview/ Patient Status    Patient  A&Ox2, vss, denies pain. PO intake fair. TF continued. Tolerating well. Colostomy flatus and stool present. T&P q 2hrs, heels off loaded on pillows. Safety maintained, bed alarm on, call bell in reach.

## 2022-04-17 NOTE — Plan of Care
Inpatient Physical Therapy Progress Note IP Adult PT Eval/Treat - 04/17/22 0902    Date of Visit / Treatment  Date of Visit / Treatment 04/17/22   Note Type Progress Note   Progress Report Due 04/30/22   End Time 0902    General Information  Subjective Danielle [name], you're too much!   General Observations Pt received asleep in bed; on RA; NAD; ostomy; RN cleared   Precautions/Limitations Fall Precautions;Bed alarm;Chair alarm   Precautions/Limitations Comment Aspiration Precaution    Weight Bearing Status  Weight Bearing Status Comments WBAT BLE    Vital Signs and Orthostatic Vital Signs  Vital Signs Vital Signs Stable   Vital Signs Free text RA; NAD    Pain/Comfort  Pain Comment (Pre/Post Treatment Pain) Pt c/o unquantified L knee/heel pain pre/post session; RN aware and repositioned for comfort    Patient Coping  Observed Emotional State accepting;cooperative   Verbalized Emotional State acceptance    Cognition  Overall Cognitive Status Impaired   Orientation Level Oriented to person;Oriented to place;Oriented to time   Level of Consciousness alert;confused   Following Commands Follows one step commands with increased time;Follows one step commands with repetition   Personal Safety / Judgment Fall risk;Requires supervision with mobility   Attention Requires redirection;Requires cues    Range of Motion  Range of Motion Comments Tight HM R>L    Manual Muscle Testing  Manual Muscle Testing Comments + Deconditioned    Skin Assessment  Skin Assessment See Nursing Documentation    Balance  Sitting Balance: Static  FAIR-      Contact Guard to maintain static position with no Assistive Device   Sitting Balance: Dynamic  POOR+   Moves through 1/2 range with minimal assist to right self   Balance Assist Device Hand held assist   Balance Skills Training Comment Ax1    Bed Mobility  Supine-to-Sit Independence/Assistance Level Maximum assist;Assist of 2   Supine-to-Sit Assist Device Head of bed elevated;Hand held assist;Draw pad;Bed rails   Sit-to-Supine Independence/Assistance Level Maximum assist;Assist of 2   Sit-to-Supine Assist Device Hand held assist   Limitations decreased ability to use legs for bridging/pushing   Bed Mobility, Impairments ROM decreased;strength decreased;pain;sensory feedback impaired;postural control impaired   Bed mobility was limited by: impaired motor function;pain   Bed Mobility Comments Retropulsive seated EOB w/ R lateral lean, VCs and TCs to correct    Sit-Stand Transfer Training  Sit-Stand Transfer Comments Deferred d/t BLE weakness, pain, and unsteadiness    Handoff Documentation  Handoff Patient in bed;Bed alarm;Patient instructed to call nursing for mobility;Discussed with nursing    Endurance  Endurance Comments Fair-    PT- AM-PAC - Basic Mobility Screen- How much help from another person do you currently need.....  Turning from your back to your side while in a a flat bed without using rails? 1 - Total - Requires total assistance or cannot do it at all.   Moving from lying on your back to sitting on the side of a flat bed without using bed rails? 1 - Total - Requires total assistance or cannot do it at all.   Moving to and from a bed to a chair (including a wheelchair)? 1 - Total - Requires total assistance or cannot do it at all.   Standing up from a chair using your arms(e.g., wheelchair or bedside chair)? 1 - Total - Requires total assistance or cannot do it at all.   To walk in a hospital room? 1 - Total -  Requires total assistance or cannot do it at all.   Climbing 3-5 steps with a railing? 1 - Total - Requires total assistance or cannot do it at all.   AMPAC Mobility Score 6   TARGET Highest Level of Mobility Mobility Level 2, Turn self in bed/bed activity/dependent transfer    ACTUAL Highest Level of Mobility Mobility Level 3, Sit on edge of bed    Therapeutic Exercise  Therapeutic Exercise Comments AROM x 4; PROM B HM    Neuromuscular Re-education  Neuromuscular Re-education Comments Seated postural control training - cross body reaches    Therapeutic Functional Activity  Therapeutic Functional Activity Comments Bed mobility; Pt edu    Clinical Impression  Follow up Assessment Pt seen for PT f/u. Requires maxAx2 for bed mobility. Pt retropulsive w/ lateral lean seated EOB requiring TCs and VCs to correct. Worked on improved trunk control w/ cross body reaches seated EOB. PROM to BLE d/t tightness B HM. Will benefit from skilled services to improvbe gross functional ROM/strength, seated postural control, and activity tolerance. Current PT d/c rec is STR.   Criteria for Skilled Therapeutic Interventions Met yes    Patient/Family Stated Goals  Patient/Family Stated Goal(s) decrease pain;feel better    Frequency/Equipment Recommendations  PT Frequency 2x per week   What day of week is next treatment expected? Monday   5/1  PT/PTA completing this assessment RL   Equipment Needs During Admission/Treatment No device    PT Recommendations for Inpatient Admission  Activity/Level of Assist dangle edge of bed   lift oob  ADL Recommendations assist of 2    Planned Treatment / Interventions  Plan for Next Visit Progress as tol   Training Treatment / Interventions Balance / Investment banker, operational;Endurance Training;Functional Personnel officer / Interventions Patient Education / Training    PT Discharge Summary  Physical Therapy Disposition Recommendation Short Term Rehab     Shanon Payor PT, [place]: [phone]

## 2022-04-17 NOTE — Progress Notes
[facility] Medicine Progress NoteAttending Provider: Pryor Ochoa, MD Subjective                                                                              Subjective: Interim History: Patient seen this AM. Denied any complaints. No acute events overnight.  Review of Allergies/Meds/Hx: Review of Allergies/Meds/Hx:I have reviewed the patient's: allergies, current scheduled medications, current infusions, current prn medications and past medical history Objective Objective: Vitals:I have reviewed the patient's current vital signs as documented in the patient's EMR.   and Last 24 hours: Temp:  [97.4 ?F (36.3 ?C)-98.5 ?F (36.9 ?C)] 97.5 ?F (36.4 ?C)Pulse:  [71-99] 71Resp:  [17-18] 18BP: (100-120)/(61-78) 113/75SpO2:  [95 %-100 %] 100 %I/O's:I have reviewed the patient's current I&O's as documented in the EMR.Gross Totals (Last 24 hours) at 04/17/2022 1454Last data filed at 04/17/2022 0900Intake 1189 ml Output 750 ml Net 439 ml Procedures:None Physical Exam Constitutional: oriented to person, and no acute distress.Marland KitchenHEENT: Head: NCAT. Eyes: EOMI. PERRL. Neck: Neck supple. Cardiovascular: RRRS1S2. ?No murmursPulmonary/Chest: BS +. No crackles. No wheezes.Abdominal: Soft. BS +. Non-tender, Non-distended. Ileostomy-no blood. PEG site c/d/i.GU: PurewickMusculoskeletal: Normal ROM. Neurological: alert and oriented to person. Non focal--BLE weakness & slight drop foot.Extremities: BLE + edema. Skin: No rashesLabs:I have reviewed the patient's labs within the last 24 hrs.Last 24 hours: No results found for this or any previous visit (from the past 24 hour(s)).Diagnostics:No new radiology.ECG/Tele Events: No ECG ordered today Assessment Assessment: Assessment:66 y.o. female with PMH of RA, recent prolonged hospitalization for perforated diverticulitis (s/p ex Lap/R hemicolectomy with end ileostomy). Admitted 2/24 for LTAC placement and persistent encephalopathy. Found to be febrile without clear infectious source found, fevers resolved. Extensive work-up of encephalopathy and leg weakness unremarkable. Course c/b elevated LFTs which resolved, likely 2/2 DILI from ceftriaxone or Zosyn. Stable and awaiting T19/LTC placement, but with ongoing significant hypercalcemia.?Mental status appears improved.Principal Problem:  Fever, unspecified fever cause  SNOMED [place](R): FEVER   Plan Plan: # Hx Recurrent Fever/R-foot Cellulitis: improved. -transient fever resolved--last fever 04/05/22, no leukocytosis- PCT decreased 5.95--> 2.08-BCx-04/02/22--NGTD-S/p CTX 2/21 and Zosyn 2/21-2/25-completed ABX-Oxacillin-consulted Podiatry--appreciated recs -Tylenol prn?# Hypercalcemia: improving -Acute on?chronic--resolved-PTH normal (28);?work-up including PTH-rP (9), vitamin D testing, TSH, random cortisol unrevealing-Possibly d/t immobility or primary hyperparathyroidism; -Endocrine consulted -s/p Zolendronic acid 4 mg X1 (3/21)-fasting CTx (Collagen Type-1 Telopeptide)-elevated?# Acute encephalopathy: improved LE weakness:-Unclear etiology, possibly prolonged delirium iso hospitalization from Nov 2022 - currentMRI brain, MRI total spine unremarkable-LP unremarkable-Paraneoplastic and autoimmune encephalopathy panels negative-Overall somewhat improved cognitive status during admission, remains alert and oriented x 1-2-Neurology rec'd EMG to complete work-up of LE weakness but pt declined; Neurology does not recommend any further testing and signed off 02/26/22-c/w Gabapentin 100 mg QHS -BLE US-no DVT??# Perforated diverticulum: -s/p R hemicolectomy, end ileostomy/LFT elevation-c/b fluid abscess requiring drainage during Nov 2022 admission in [place]. [place]-?. 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?on this admission?now [place] scan shows improved Peritoneal nodules?Per 3/30 notes, seems imaging was reviewed and that nodules were improving and thought to be resolution of continued debris.-Work-up showed elevated IgG, smooth muscle antibodies c/f possible autoimmune hepatitisInitial concern that imaging showed peritoneal nodules ?carcinomatosis-On review of imaging, nodules improving, more likely to be continued  resolution of debris from perforation-Liver tests resolved without intervention, so most likely DILI from antibiotics (ceftriaxone and Zosyn)?# R lateral foot and L medial eminence deep tissue injuries:-Stable-Xray feet with degenerative changes but no acute findings-?Podiatry following - no dressing requirements, WBAT to BLE.No?indication for podiatric surgical intervention at this time. ?Patient may follow-up with Dr. Gazes within 3-4 weeks of discharge.?-?No need for abx at this time --BLE US-no DVT-tramadol 25 mg q12h, prn for pain?# Hx Bilateral DVT:- Apixaban 5mg  BID--restarted-dc'd Lovenox 80 mg q12H?# Iron deficiency anemia:-Hematology consult &??signed off - elevated FLC ratio felt to be reactive, plasma cell disorder unlikely ?# Chronic conditions:-Rheumatoid Arthritis - has been off of MTX since November/acute illness, rheumatology signing off current ams/weakness not felt to be related to her RA, follow up outpatient -Bilateral LE DVT - ?provoked from recent surgery, cont lovenox, can resume apixaban at discharge? # Dysphagia:-resolved-s/p  TF + FWF, regular diet with thin liquids -SLP consulted--for re-evluation-aspiration precautions?# GOC and Capacity:- during hospitalization November 2022 with perforated diverticulum, deemed at this time not to have capacity. PEG was ultimately placed and TFs were initiated. She also cleared for regular diet. T19 and conservatorship hearing set for 05/01/22.  Electronically Signed:La Shehan Westley Hummer, MD Beeper 04/17/2022,

## 2022-04-17 NOTE — Plan of Care
Plan of Care Overview/ Patient Status    Medical Social Work Follow Up  [employer] Most Recent Value Admission Information  Document Type Progress Note Reason for Inability to Assess --  [Patient mental status is altered] (For Inpatient/ED Only) Prior psychosocial assessment has been documented within this hospitalization No (For Inpatient/ED Only) Prior psychosocial assessment has been documented within 30 days of this hospitalization No Reason for Current Social Work Involvement Support/Coping, Surveyor, quantity of Information Patient Family/Caregiver Comment Public librarian Medical Team Comment Medical Team, Complex Care Management, Care Management Community Agency Comment [place] [facility] Record Reviewed Yes Level of Care Inpatient Psychosocial issues requiring intervention Support and Conservatorship Psychosocial interventions 10 minutes spent face to face with Dalasia. I reviewed the chart. Willie presented as alert and pleasantly engaged. I provided support and education surrounding the conservatorship process. I explained that her sister [name] has been advocating for her and working on conservatorship to assist in her discharge plan. I informed Keyonda and she will be served the notification of the hearing and assured her that it is part of the process and nothing to be alarmed about. [place] appreciated my support and reassurance. I informed her that I wll continue to provide support to her as needed. I thanked Yamilett for her time. Collaborations None Specific referrals to enhance community supports (include existing and new resources) None Handoff Required? No Next Steps/Plan (including hand-off): I will remain involved for support as needed. Signature: Leim Fabry, LCSW Contact Information: [phone]

## 2022-04-17 NOTE — Plan of Care
Plan of Care Overview/ Patient Status    1900-0700: Pt A&O to?2-3. D/o situation. Confused. Pt able to respond to questions. VSS on RA. No signs of pain or respiratory distress. Scheduled meds given per order. Ileostomy x2  in place R Ileostomy +flatus and stool.  Pt incontinent of urine, prn care provided. Dressing changed to R ileostomy.  PEG tube?in place, dressing changed this shift.?Bolus TF given 1 cartons of Jevity 1.5.??Good PO intake.?Podus boots in place. T&Rq2h. Hourly rounding. Pt resting in bed. Safety precautions maintained, bed alarm on, call bell in reach, will ctm and follow nursing poc.Problem: Adult Inpatient Plan of CareGoal: Plan of Care ReviewOutcome: Interventions implemented as appropriateFlowsheets (Taken 03/29/2022 0309)Progress: improvingPlan of Care Reviewed With: patient

## 2022-04-17 NOTE — Plan of Care
Plan of Care Overview/ Patient Status    1900-0700:  ?Pt A&Ox2 disoriented situation.  Pt able to respond to questions.?VSS on RA. Denies SOB, CP, N/V/D. Scheduled meds given per order. Ileostomy?x2?in place.  Right sided Ileostomy?+flatus and stool. ?Pt incontinent of urine, prn care provided.?PEG tube?in place, dressing changed this shift.?Bolus TF given 1.5 cartons of Jevity 1.5.??Good PO intake.?BLE elevated. T&Rq2h. Pt resting in bed. Hourly rounding. Safety precautions maintained, bed alarm on, call bell in reach, will ctm and follow nursing pocProblem: Adult Inpatient Plan of CareGoal: Plan of Care ReviewOutcome: Interventions implemented as appropriateFlowsheets (Taken 04/16/2022 2348)Progress: improvingPlan of Care Reviewed With: patientGoal: Patient-Specific Goal (Individualized)Outcome: Interventions implemented as appropriateGoal: Absence of Hospital-Acquired Illness or InjuryOutcome: Interventions implemented as appropriateGoal: Optimal Comfort and WellbeingOutcome: Interventions implemented as appropriateGoal: Readiness for Transition of CareOutcome: Interventions implemented as appropriate

## 2022-04-18 LAB — BASIC METABOLIC PANEL
BKR ANION GAP: 9 (ref 7–17)
BKR BLOOD UREA NITROGEN: 9 mg/dL (ref 8–23)
BKR BUN / CREAT RATIO: 25 — ABNORMAL HIGH (ref 8.0–23.0)
BKR CALCIUM: 10.1 mg/dL (ref 8.8–10.2)
BKR CHLORIDE: 106 mmol/L (ref 98–107)
BKR CO2: 22 mmol/L (ref 20–30)
BKR CREATININE: 0.36 mg/dL — ABNORMAL LOW (ref 0.40–1.30)
BKR EGFR, CREATININE (CKD-EPI 2021): >60 mL/min/{1.73_m2} (ref >=60)
BKR GLUCOSE: 98 mg/dL (ref 70–100)
BKR POTASSIUM: 3.9 mmol/L (ref 3.3–5.3)
BKR SODIUM: 137 mmol/L (ref 136–144)

## 2022-04-18 LAB — CBC WITHOUT DIFFERENTIAL
BKR WAM ANALYZER ANC: 2.8 x 1000/ÂµL (ref 2.00–7.60)
BKR WAM HEMATOCRIT (2 DEC): 33.3 % — ABNORMAL LOW (ref 35.00–45.00)
BKR WAM HEMOGLOBIN: 10.4 g/dL — ABNORMAL LOW (ref 11.7–15.5)
BKR WAM MCH (PG): 27.1 pg (ref 27.0–33.0)
BKR WAM MCHC: 31.2 g/dL (ref 31.0–36.0)
BKR WAM MCV: 86.7 fL (ref 80.0–100.0)
BKR WAM MPV: 10 fL (ref 8.0–12.0)
BKR WAM PLATELETS: 404 x1000/ÂµL (ref 150–420)
BKR WAM RDW-CV: 16.4 % — ABNORMAL HIGH (ref 11.0–15.0)
BKR WAM RED BLOOD CELL COUNT.: 3.84 M/ÂµL — ABNORMAL LOW (ref 4.00–6.00)
BKR WAM WHITE BLOOD CELL COUNT: 5.7 x1000/ÂµL (ref 4.0–11.0)

## 2022-04-18 LAB — ALBUMIN: BKR ALBUMIN: 3.2 g/dL — ABNORMAL LOW (ref 3.6–4.9)

## 2022-04-18 MED ORDER — SODIUM CHLORIDE 0.9 % BOLUS (NEW BAG)
0.9 % | Freq: Once | INTRAVENOUS | Status: CP
Start: 2022-04-18 — End: ?
  Administered 2022-04-18: 16:00:00 0.9 mL/h via INTRAVENOUS

## 2022-04-18 NOTE — Plan of Care
Plan of Care Overview/ Patient Status    Problem: Adult Inpatient Plan of CareGoal: Readiness for Transition of CareOutcome: Interventions implemented as appropriate Pt remains medically stable for transfer to LTC facility. Conservator hearing is scheduled for 5/11 @ 11:15 am. Following hearing/determination, CM team can begin LTC placement process. Elinor Parkinson, RN, MSNCare ManagerMHB: [phone]

## 2022-04-18 NOTE — Progress Notes
[facility] Medicine Progress NoteAttending Provider: Pryor Ochoa, MD Subjective                                                                              Subjective: Interim History: Patient seen this AM. Denied any complaints. No acute events overnight. Overnight ileostomy bag changed.  Review of Allergies/Meds/Hx: Review of Allergies/Meds/Hx:I have reviewed the patient's: allergies, current scheduled medications, current infusions, current prn medications and past medical history Objective Objective: Vitals:I have reviewed the patient's current vital signs as documented in the patient's EMR.   and Last 24 hours: Temp:  [97.5 ?F (36.4 ?C)-98.5 ?F (36.9 ?C)] 98.5 ?F (36.9 ?C)Pulse:  [70-92] 70Resp:  [18-20] 18BP: (98-127)/(63-87) 99/63SpO2:  [95 %-100 %] 98 %I/O's:I have reviewed the patient's current I&O's as documented in the EMR.No intake or output data in the 24 hours ending 04/18/22 1000Procedures:None Physical Exam Constitutional: oriented to person, and no acute distress.Marland KitchenHEENT: Head: NCAT. Eyes: EOMI. PERRL. Neck: Neck supple. Cardiovascular: RRRS1S2. ?No murmursPulmonary/Chest: BS +. No crackles. No wheezes.Abdominal: Soft. BS +. Non-tender, Non-distended. Ileostomy-no blood. PEG site c/d/i.GU: PurewickMusculoskeletal: Normal ROM. Neurological: alert and oriented to person. Non focal--BLE weakness & slight drop foot.Extremities: BLE + edema. Skin: No rashesLabs:I have reviewed the patient's labs within the last 24 hrs.Last 24 hours: Recent Results (from the past 24 hour(s)) CBC without differential  Collection Time: 04/18/22  5:52 AM Result Value Ref Range  WBC 5.7 4.0 - 11.0 x1000/?L  RBC 3.84 (L) 4.00 - 6.00 M/?L  Hemoglobin 10.4 (L) 11.7 - 15.5 g/dL  Hematocrit 16.10 (L) 96.04 - 45.00 %  MCV 86.7 80.0 - 100.0 fL  MCH 27.1 27.0 - 33.0 pg  MCHC 31.2 31.0 - 36.0 g/dL  RDW-CV 54.0 (H) 98.1 - 15.0 %  Platelets 404 150 - 420 x1000/?L  MPV 10.0 8.0 - 12.0 fL  ANC(Abs Neutrophil Count) 2.80 2.00 - 7.60 x 1000/?L Albumin  Collection Time: 04/18/22  5:52 AM Result Value Ref Range  Albumin 3.2 (L) 3.6 - 4.9 g/dL Basic metabolic panel  Collection Time: 04/18/22  5:52 AM Result Value Ref Range  Sodium 137 136 - 144 mmol/L  Potassium 3.9 3.3 - 5.3 mmol/L  Chloride 106 98 - 107 mmol/L  CO2 22 20 - 30 mmol/L  Anion Gap 9 7 - 17  Glucose 98 70 - 100 mg/dL  BUN 9 8 - 23 mg/dL  Creatinine 1.91 (L) 4.78 - 1.30 mg/dL  Calcium 29.5 8.8 - 62.1 mg/dL  BUN/Creatinine Ratio 30.8 (H) 8.0 - 23.0  eGFR (Creatinine) >60 >=60 mL/min/1.28m2 Diagnostics:No new radiology.ECG/Tele Events: No ECG ordered today Assessment Assessment: Assessment:66 y.o. female with PMH of RA, recent prolonged hospitalization for perforated diverticulitis (s/p ex Lap/R hemicolectomy with end ileostomy). Admitted 2/24 for LTAC placement and persistent encephalopathy. Found to be febrile without clear infectious source found, fevers resolved. Extensive work-up of encephalopathy and leg weakness unremarkable. Course c/b elevated LFTs which resolved, likely 2/2 DILI from ceftriaxone or Zosyn. Stable and awaiting T19/LTC placement, but with ongoing significant hypercalcemia.?Mental status appears improved.Principal Problem:  Fever, unspecified fever cause  SNOMED [place](R): FEVER   Plan Plan: # Hx Recurrent Fever/R-foot Cellulitis: improved. -transient fever resolved--last fever 04/05/22, no  leukocytosis- PCT decreased 5.95--> 2.08-BCx-04/02/22--NGTD-S/p CTX 2/21 and Zosyn 2/21-2/25-completed ABX-Oxacillin-consulted Podiatry--appreciated recs -Tylenol prn?# Hypercalcemia: improving -Acute on?chronic--resolved-PTH normal (28);?work-up including PTH-rP (9), vitamin D testing, TSH, random cortisol unrevealing-Possibly d/t immobility or primary hyperparathyroidism; -Endocrine consulted -s/p Zolendronic acid 4 mg X1 (3/21)-fasting CTx (Collagen Type-1 Telopeptide)-elevated- Corrected Ca today noted to be 10.7 -> will trial 1 L NS bolus as she has been having poor po intake and hypotensive. ?# Acute encephalopathy: improved LE weakness:-Unclear etiology, possibly prolonged delirium iso hospitalization from Nov 2022 - currentMRI brain, MRI total spine unremarkable-LP unremarkable-Paraneoplastic and autoimmune encephalopathy panels negative-Overall somewhat improved cognitive status during admission, remains alert and oriented x 1-2-Neurology rec'd EMG to complete work-up of LE weakness but pt declined; Neurology does not recommend any further testing and signed off 02/26/22-c/w Gabapentin 100 mg QHS -BLE US-no DVT??# Perforated diverticulum: -s/p R hemicolectomy, end ileostomy/LFT elevation-c/b fluid abscess requiring drainage during Nov 2022 admission in [place]. [place]-?. 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?on this admission?now [place] scan shows improved Peritoneal nodules?Per 3/30 notes, seems imaging was reviewed and that nodules were improving and thought to be resolution of continued debris.-Work-up showed elevated IgG, smooth muscle antibodies c/f possible autoimmune hepatitisInitial concern that imaging showed peritoneal nodules ?carcinomatosis-On review of imaging, nodules improving, more likely to be continued resolution of debris from perforation-Liver tests resolved without intervention, so most likely DILI from antibiotics (ceftriaxone and Zosyn)?# R lateral foot and L medial eminence deep tissue injuries:-Stable-Xray feet with degenerative changes but no acute findings-?Podiatry following - no dressing requirements, WBAT to BLE.No?indication for podiatric surgical intervention at this time. ?Patient may follow-up with Dr. Gazes within 3-4 weeks of discharge.?-?No need for abx at this time --BLE US-no DVT-tramadol 25 mg q12h, prn for pain?# Hx Bilateral DVT:- Apixaban 5mg  BID?# Iron deficiency anemia:-Hematology consult &??signed off - elevated FLC ratio felt to be reactive, plasma cell disorder unlikely ?# Chronic conditions:-Rheumatoid Arthritis - has been off of MTX since November/acute illness, rheumatology signing off current ams/weakness not felt to be related to her RA, follow up outpatient -Bilateral LE DVT - ?provoked from recent surgery, cont lovenox, can resume apixaban at discharge? # Dysphagia:-resolved-s/p  TF + FWF, regular diet with thin liquids -SLP consulted--for re-evluation-aspiration precautions?# GOC and Capacity:- during hospitalization November 2022 with perforated diverticulum, deemed at this time not to have capacity. PEG was ultimately placed and TFs were initiated. She also cleared for regular diet. T19 and conservatorship hearing set for 05/01/22.  Electronically Signed:Klaudia Beirne Westley Hummer, MD Beeper 04/18/2022,

## 2022-04-18 NOTE — Plan of Care
Plan of Care Overview/ Patient Status    Assumed care of pt at 1900. Pt awake and resting in bed at shift change, no distress noted. Pt is pleasantly confused to time and situation. Ileostomy producing brown stool, bag changed d/t increased air breaking the seal. Pt is incontinent of urine, care provided. Bolus tube feed complete. Pt able to take her pills whole with water and without incident. Call bell left within pt's reach. Bed alarm is on. Hourly rounding done.

## 2022-04-18 NOTE — Plan of Care
Plan of Care Overview/ Patient Status    Patient alert, calm, and cooperative throughout the day. IV fluids administered as ordered. Encouraged oral fluid intake with patient as well, estimated 720 mL oral intake. Tube feeding boluses administered x3 today. Call light within reach, all questions answered to the best of my ability.Problem: Adult Inpatient Plan of CareGoal: Plan of Care ReviewOutcome: Interventions implemented as appropriate

## 2022-04-19 MED ORDER — FAMOTIDINE 20 MG TABLET
20 mg | Freq: Two times a day (BID) | ORAL | Status: DC
Start: 2022-04-19 — End: 2022-06-30
  Administered 2022-04-19 – 2022-06-30 (×145): 20 mg via ORAL

## 2022-04-19 MED ORDER — FAMOTIDINE 20 MG TABLET
20 mg | Freq: Two times a day (BID) | ORAL | Status: DC
Start: 2022-04-19 — End: 2022-04-19

## 2022-04-19 MED ORDER — APIXABAN 5 MG TABLET
5 mg | Freq: Two times a day (BID) | ORAL | Status: DC
Start: 2022-04-19 — End: 2022-05-12
  Administered 2022-04-20 – 2022-05-12 (×46): 5 mg via ORAL

## 2022-04-19 MED ORDER — PYRIDOXINE (VITAMIN B6) 50 MG TABLET
50 mg | Freq: Every day | ORAL | Status: DC
Start: 2022-04-19 — End: 2022-06-30
  Administered 2022-04-20 – 2022-06-30 (×72): 50 mg via ORAL

## 2022-04-19 MED ORDER — FOLIC ACID 1 MG TABLET
1 mg | Freq: Every day | ORAL | Status: DC
Start: 2022-04-19 — End: 2022-06-30
  Administered 2022-04-20 – 2022-06-30 (×72): 1 mg via ORAL

## 2022-04-19 MED ORDER — FERROUS GLUCONATE 324 MG (38 MG IRON) TABLET
32438 mg (38 mg iron) | Freq: Two times a day (BID) | ORAL | Status: DC
Start: 2022-04-19 — End: 2022-06-14
  Administered 2022-04-19 – 2022-06-14 (×113): 324 mg (38 mg iron) via ORAL

## 2022-04-19 MED ORDER — THIAMINE HCL (VITAMIN B1) 100 MG TABLET
100 mg | Freq: Two times a day (BID) | ORAL | Status: DC
Start: 2022-04-19 — End: 2022-06-30
  Administered 2022-04-20 – 2022-06-30 (×144): 100 mg via ORAL

## 2022-04-19 MED ORDER — MELATONIN 3 MG TABLET
3 mg | Freq: Every evening | ORAL | Status: DC
Start: 2022-04-19 — End: 2022-08-15
  Administered 2022-04-20 – 2022-08-14 (×110): 3 mg via ORAL

## 2022-04-19 MED ORDER — FERROUS GLUCONATE 324 MG (38 MG IRON) TABLET
324 mg (38 mg iron) | Freq: Two times a day (BID) | ORAL | Status: DC
Start: 2022-04-19 — End: 2022-04-19

## 2022-04-19 MED ORDER — POLYETHYLENE GLYCOL 3350 17 GRAM ORAL POWDER PACKET
17 gram | Freq: Every day | ORAL | Status: DC | PRN
Start: 2022-04-19 — End: 2022-06-30

## 2022-04-19 MED ORDER — NIACIN IMMEDIATE RELEASE 100 MG TABLET
100 mg | Freq: Every evening | ORAL | Status: DC
Start: 2022-04-19 — End: 2022-06-30
  Administered 2022-04-20 – 2022-06-30 (×72): 100 mg via ORAL

## 2022-04-19 MED ORDER — ACETAMINOPHEN 325 MG TABLET
325 mg | Freq: Four times a day (QID) | ORAL | Status: DC | PRN
Start: 2022-04-19 — End: 2022-06-30
  Administered 2022-04-24 – 2022-06-30 (×30): 325 mg via ORAL

## 2022-04-19 MED ORDER — BANANA FLAKES-TRANSGALACTOOLIGOSACCHARIDE ORAL POWDER PACKET
Freq: Two times a day (BID) | ORAL | Status: DC
Start: 2022-04-19 — End: 2022-05-25
  Administered 2022-04-20 – 2022-05-25 (×60): via ORAL

## 2022-04-19 NOTE — Progress Notes
[facility] Progress NoteAttending Provider: Pryor Ochoa, MD Subjective                                                                              Subjective: Interim History: Patient seen this AM. Denied any complaints. No acute events overnight. Has been tolerating her meds PO and tolerating diet. Per nursing, she takes a few bites, but unable to eat more. Continues with nocturnal tube feeds. Review of Allergies/Meds/Hx: Review of Allergies/Meds/Hx:I have reviewed the patient's: allergies, current scheduled medications, current infusions, current prn medications and past medical history Objective Objective: Vitals:I have reviewed the patient's current vital signs as documented in the patient's EMR.   and Last 24 hours: Temp:  [97.8 ?F (36.6 ?C)-98.7 ?F (37.1 ?C)] 97.9 ?F (36.6 ?C)Pulse:  [79-97] 79Resp:  [15-18] 18BP: (107-138)/(70-90) 107/71SpO2:  [97 %-98 %] 97 %I/O's:I have reviewed the patient's current I&O's as documented in the EMR.Gross Totals (Last 24 hours) at 04/19/2022 1604Last data filed at 04/19/2022 1300Intake 1047 ml Output 450 ml Net 597 ml Procedures:None Physical Exam Constitutional: oriented to person, and no acute distress.Marland KitchenHEENT: Cardiovascular: RRRS1S2. ?No murmursPulmonary/Chest: BS +. No crackles. No wheezes.Abdominal: Soft. BS +. Non-tender, Non-distended. Ileostomy-no blood. PEG site c/d/i.GU: PurewickMusculoskeletal: Normal ROM. Neurological: alert and oriented to person. Non focal--BLE weakness & slight drop foot.Extremities: BLE + edema. Skin: No rashesLabs:I have reviewed the patient's labs within the last 24 hrs.Last 24 hours: No results found for this or any previous visit (from the past 24 hour(s)).Diagnostics:No new radiology.ECG/Tele Events: No ECG ordered today Assessment Assessment: Assessment:65 y.o. female with PMH of RA, recent prolonged hospitalization for perforated diverticulitis (s/p ex Lap/R hemicolectomy with end ileostomy). Admitted 2/24 for LTAC placement and persistent encephalopathy. Found to be febrile without clear infectious source found, fevers resolved. Extensive work-up of encephalopathy and leg weakness unremarkable. Course c/b elevated LFTs which resolved, likely 2/2 DILI from ceftriaxone or Zosyn. Stable and awaiting T19/LTC placement, but with ongoing significant hypercalcemia.?Mental status appears improved.Principal Problem:  Fever, unspecified fever cause  SNOMED [place](R): FEVER   Plan Plan: # Hx Recurrent Fever/R-foot Cellulitis: improved. -transient fever resolved--last fever 04/05/22, no leukocytosis- PCT decreased 5.95--> 2.08-BCx-04/02/22--NGTD-S/p CTX 2/21 and Zosyn 2/21-2/25-completed ABX-Oxacillin-consulted Podiatry--appreciated recs -Tylenol prn?# Hypercalcemia: improving -Acute on?chronic--resolved-PTH normal (28);?work-up including PTH-rP (9), vitamin D testing, TSH, random cortisol unrevealing-Possibly d/t immobility or primary hyperparathyroidism; -Endocrine consulted -s/p Zolendronic acid 4 mg X1 (3/21)-fasting CTx (Collagen Type-1 Telopeptide)-elevated- Corrected Ca today noted to be 10.7 -> will trial 1 L NS bolus as she has been having poor po intake and hypotensive. ?# Acute encephalopathy: improved LE weakness:-Unclear etiology, possibly prolonged delirium iso hospitalization from Nov 2022 - currentMRI brain, MRI total spine unremarkable-LP unremarkable-Paraneoplastic and autoimmune encephalopathy panels negative-Overall somewhat improved cognitive status during admission, remains alert and oriented x 1-2-Neurology rec'd EMG to complete work-up of LE weakness but pt declined; Neurology does not recommend any further testing and signed off 02/26/22-c/w Gabapentin 100 mg QHS -BLE US-no DVT??# Perforated diverticulum: -s/p R hemicolectomy, end ileostomy/LFT elevation-c/b fluid abscess requiring drainage during Nov 2022 admission in [place]. [place]-?. 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?on this admission?now [place] scan shows improved Peritoneal nodules?Per 3/30 notes, seems imaging  was reviewed and that nodules were improving and thought to be resolution of continued debris.-Work-up showed elevated IgG, smooth muscle antibodies c/f possible autoimmune hepatitisInitial concern that imaging showed peritoneal nodules ?carcinomatosis-On review of imaging, nodules improving, more likely to be continued resolution of debris from perforation-Liver tests resolved without intervention, so most likely DILI from antibiotics (ceftriaxone and Zosyn)?# R lateral foot and L medial eminence deep tissue injuries:-Stable-Xray feet with degenerative changes but no acute findings-?Podiatry following - no dressing requirements, WBAT to BLE.No?indication for podiatric surgical intervention at this time. ?Patient may follow-up with Dr. Gazes within 3-4 weeks of discharge.?-?No need for abx at this time --BLE US-no DVT-tramadol 25 mg q12h, prn for pain?# Hx Bilateral DVT:- Apixaban 5mg  BID?# Iron deficiency anemia:-Hematology consult &??signed off - elevated FLC ratio felt to be reactive, plasma cell disorder unlikely ?# Chronic conditions:-Rheumatoid Arthritis - has been off of MTX since November/acute illness, rheumatology signing off current ams/weakness not felt to be related to her RA, follow up outpatient -Bilateral LE DVT - ?provoked from recent surgery, cont lovenox, can resume apixaban at discharge? # Dysphagia:-resolved-s/p  TF + FWF, regular diet with thin liquids -SLP consulted--for re-evluation-aspiration precautions?# GOC and Capacity:- during hospitalization November 2022 with perforated diverticulum, deemed at this time not to have capacity. PEG was ultimately placed and TFs were initiated. She also cleared for regular diet. T19 and conservatorship hearing set for 05/01/22.  Electronically Signed:Rebbecca Osuna Westley Hummer, MD Beeper 04/19/2022,

## 2022-04-19 NOTE — Plan of Care
Plan of Care Overview/ Patient Status    1900-0700Pt A&Ox2, disoriented to time and situation. VSS on RA. Denies CP, SOB, N/V. Inc of urine, purewick in place. R ileostomy, +stool and flatus. Pills crushed thru PEG, Jevity bolus given, tolerated well. Excoriation in groin, zguard applied, heels elevated. q2h T&R. Safety precaution maintained. Bed alarm on. Hourly rounding performed.

## 2022-04-19 NOTE — Plan of Care
Plan of Care Overview/ Patient Status    1100-1900. A&Ox1. VSS on room air. Denies pain. Bilateral ostomies in place, right draining stool. Takes pills whole. Bolus feeds given. Purewick in place. Assist x 2 in bed. Safety maintained, bed alarm on, hourly rounding completed.

## 2022-04-20 LAB — BASIC METABOLIC PANEL
BKR ANION GAP: 8 (ref 7–17)
BKR BLOOD UREA NITROGEN: 11 mg/dL (ref 8–23)
BKR BUN / CREAT RATIO: 29.7 — ABNORMAL HIGH (ref 8.0–23.0)
BKR CALCIUM: 9.8 mg/dL (ref 8.8–10.2)
BKR CHLORIDE: 107 mmol/L (ref 98–107)
BKR CO2: 23 mmol/L (ref 20–30)
BKR CREATININE: 0.37 mg/dL — ABNORMAL LOW (ref 0.40–1.30)
BKR EGFR, CREATININE (CKD-EPI 2021): >60 mL/min/{1.73_m2} (ref >=60)
BKR GLUCOSE: 95 mg/dL (ref 70–100)
BKR POTASSIUM: 4.2 mmol/L (ref 3.3–5.3)
BKR SODIUM: 138 mmol/L (ref 136–144)

## 2022-04-20 NOTE — Progress Notes
[facility] Medicine Progress NoteAttending Provider: Pryor Ochoa, MD Subjective                                                                              Subjective: Interim History: Patient seen this AM. Denied any complaints. No acute events overnight.  Continues with nocturnal tube feeds. Review of Allergies/Meds/Hx: Review of Allergies/Meds/Hx:I have reviewed the patient's: allergies, current scheduled medications, current infusions, current prn medications and past medical history Objective Objective: Vitals:I have reviewed the patient's current vital signs as documented in the patient's EMR.   and Last 24 hours: Temp:  [97.5 ?F (36.4 ?C)-98.4 ?F (36.9 ?C)] 97.8 ?F (36.6 ?C)Pulse:  [77-95] 77Resp:  [16-18] 18BP: (101-108)/(62-71) 101/62SpO2:  [96 %-99 %] 97 %I/O's:I have reviewed the patient's current I&O's as documented in the EMR.Gross Totals (Last 24 hours) at 04/20/2022 1440Last data filed at 04/20/2022 1259Intake 1376 ml Output 2225 ml Net -849 ml Procedures:None Physical Exam Constitutional: oriented to person, and no acute distress.Marland KitchenHEENT: Cardiovascular: RRRS1S2. ?No murmursPulmonary/Chest: BS +. No crackles. No wheezes.Abdominal: Soft. BS +. Non-tender, Non-distended. Ileostomy-no blood. PEG site c/d/i.GU: PurewickMusculoskeletal: Normal ROM. Neurological: alert and oriented to person. Non focal--BLE weakness & slight drop foot.Extremities: BLE + edema. Skin: No rashesLabs:I have reviewed the patient's labs within the last 24 hrs.Last 24 hours: Recent Results (from the past 24 hour(s)) Basic metabolic panel  Collection Time: 04/20/22  5:17 AM Result Value Ref Range  Sodium 138 136 - 144 mmol/L  Potassium 4.2 3.3 - 5.3 mmol/L  Chloride 107 98 - 107 mmol/L  CO2 23 20 - 30 mmol/L  Anion Gap 8 7 - 17  Glucose 95 70 - 100 mg/dL  BUN 11 8 - 23 mg/dL Creatinine 7.82 (L) 9.56 - 1.30 mg/dL  Calcium 9.8 8.8 - 21.3 mg/dL  BUN/Creatinine Ratio 08.6 (H) 8.0 - 23.0  eGFR (Creatinine) >60 >=60 mL/min/1.57m2 Diagnostics:No new radiology.ECG/Tele Events: No ECG ordered today Assessment Assessment: Assessment:66 y.o. female with PMH of RA, recent prolonged hospitalization for perforated diverticulitis (s/p ex Lap/R hemicolectomy with end ileostomy). Admitted 2/24 for LTAC placement and persistent encephalopathy. Found to be febrile without clear infectious source found, fevers resolved. Extensive work-up of encephalopathy and leg weakness unremarkable. Course c/b elevated LFTs which resolved, likely 2/2 DILI from ceftriaxone or Zosyn. Stable and awaiting T19/LTC placement, but with ongoing significant hypercalcemia.?Mental status appears improved.Principal Problem:  Fever, unspecified fever cause  SNOMED [employer](R): FEVER   Plan Plan: # Hx Recurrent Fever/R-foot Cellulitis: improved. -transient fever resolved--last fever 04/05/22, no leukocytosis- PCT decreased 5.95--> 2.08-BCx-04/02/22--NGTD-S/p CTX 2/21 and Zosyn 2/21-2/25-completed ABX-Oxacillin-consulted Podiatry--appreciated recs -Tylenol prn?# Hypercalcemia: improving -Acute on?chronic--resolved-PTH normal (28);?work-up including PTH-rP (9), vitamin D testing, TSH, random cortisol unrevealing-Possibly d/t immobility or primary hyperparathyroidism; -Endocrine consulted -s/p Zolendronic acid 4 mg X1 (3/21)-fasting CTx (Collagen Type-1 Telopeptide)-elevated- Corrected Ca today noted to be 10.7 -> will trial 1 L NS bolus as she has been having poor po intake and hypotensive. ?# Acute encephalopathy: improved LE weakness:-Unclear etiology, possibly prolonged delirium iso hospitalization from Nov 2022 - currentMRI brain, MRI total spine unremarkable-LP unremarkable-Paraneoplastic and autoimmune encephalopathy panels negative-Overall somewhat improved cognitive status during admission, remains alert and oriented x 1-2-Neurology rec'd EMG to  complete work-up of LE weakness but pt declined; Neurology does not recommend any further testing and signed off 02/26/22-c/w Gabapentin 100 mg QHS -BLE US-no DVT??# Perforated diverticulum: -s/p R hemicolectomy, end ileostomy/LFT elevation-c/b fluid abscess requiring drainage during Nov 2022 admission in [place]. [place]-?. 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?on this admission?now [place] scan shows improved Peritoneal nodules?Per 3/30 notes, seems imaging was reviewed and that nodules were improving and thought to be resolution of continued debris.-Work-up showed elevated IgG, smooth muscle antibodies c/f possible autoimmune hepatitisInitial concern that imaging showed peritoneal nodules ?carcinomatosis-On review of imaging, nodules improving, more likely to be continued resolution of debris from perforation-Liver tests resolved without intervention, so most likely DILI from antibiotics (ceftriaxone and Zosyn)?# R lateral foot and L medial eminence deep tissue injuries:-Stable-Xray feet with degenerative changes but no acute findings-?Podiatry following - no dressing requirements, WBAT to BLE.No?indication for podiatric surgical intervention at this time. ?Patient may follow-up with Dr. Gazes within 3-4 weeks of discharge.?-?No need for abx at this time --BLE US-no DVT-tramadol 25 mg q12h, prn for pain?# Hx Bilateral DVT:- Apixaban 5mg  BID?# Iron deficiency anemia:-Hematology consult &??signed off - elevated FLC ratio felt to be reactive, plasma cell disorder unlikely ?# Chronic conditions:-Rheumatoid Arthritis - has been off of MTX since November/acute illness, rheumatology signing off current ams/weakness not felt to be related to her RA, follow up outpatient -Bilateral LE DVT - ?provoked from recent surgery, cont lovenox, can resume apixaban at discharge? # Dysphagia:-resolved-s/p  TF + FWF, regular diet with thin liquids -SLP consulted--for re-evluation-aspiration precautions?# GOC and Capacity:- during hospitalization November 2022 with perforated diverticulum, deemed at this time not to have capacity. PEG was ultimately placed and TFs were initiated. She also cleared for regular diet. T19 and conservatorship hearing set for 05/01/22.  Electronically Signed:Leen Tworek Westley Hummer, MD Beeper 04/20/2022,

## 2022-04-20 NOTE — Plan of Care
Plan of Care Overview/ Patient Status    [name]/Ox2, forgetful at times. Pt knows it is April however unsure of year and at [facility] BP soft however asymptomatic. Denies pain at rest or respiratory distress however pt with discomfort in BLE with movement, turned and repositioned q2h. Bilat ileostomies - Right with loose stool. Purewick draining CY urine. Minimal PO intake, pt states those formulas make me full - bolus tfs cont. Per orders. Safety maintained.

## 2022-04-20 NOTE — Plan of Care
Plan of Care Overview/ Patient Status    1900-0700Pt A&Ox2, disoriented to time and situation. VSS on RA. Denies CP, SOB, N/V.?Inc?of urine, purewick in place.?R ileostomy appliance changed, +stool and flatus.?Pills whole,?Jevity bolus given, tolerated well.?Safety precaution maintained. Bed alarm on. Hourly rounding performed.

## 2022-04-21 NOTE — Plan of Care
[name]					Location: EP [id number] y.o., female				Attending: Marlon Pel, MD	Admit Date: 02/11/2022			[id number] LOS: 69 days FOLLOW-UP NUTRITION ASSESSMENT Dietary Orders (From admission, onward)     Start     Ordered  04/03/22 1538  Diet Tube Feed With Tray  (Diet Tube Feed Panel)  DIET EFFECTIVE NOW      Comments: JEVITY 1.5 @ 1.5 carton bolus BID (@9am ,1pm) and 1 carton BID (5pm, 9pm) to total 5 cartons dailyImportant:  Unrestricted diet will be sent unless otherwise specified below. Question Answer Comment Diet Selection? Regular  Safety Tray? (Suicide/Homicide/Police Custody) Safety Tray - No  Adult Tube Feed Formulas: Jevity 1.5 (YNH,SRC)  Feeding Route: G-Tube  Continuous Rate ml/hr: Other(see comments)  Initiate Nutrition Management Protocol (Yes/No?) Yes - Initiate Protocol    04/03/22 1539    No Known AllergiesANTHROPOMETRICS:Height:???70Admit wt:?no recent wt to assess.?Current wt:?69.6 kg (5/1 bed) decreased from 72.7 kg?(4/19?, bed)- will track trends for significance.  BMI:?21.7??(based on?current wt)?Admit wt trends:?04/02/22 0829 74.6 kg Bed 03/31/22 0809 72.7 kg Bed 03/29/22 0825 73.8 kg Bed 03/28/22 0752 73 kg Bed 03/27/22 0739 73.2 kg Bed 03/26/22 0803 71.3 kg Bed 03/25/22 0826 71.5 kg Bed 03/22/22 0804 71 kg Bed 03/20/22 0812 72.6 kg Bed 03/19/22 0824 72.4 kg Bed ?Dosing Wt:?73?kg (based on?current wt trends and normal BMI)Additional wt information:Ht confirmed with pt.?Pt last known wt was 98.6 kg (12/13/2018).?Wt loss?confirmed with admit wt?as pt no longer weighs?98 kg.?Time line for wt loss in not clear and pt cannot report.?1+ trace edema?b/l LE?per flowsheets.?Noted wt loss 3/29-4/1, suspect d/t inadequate oral intake and stopping tube feed. Wt back to adm weight since TF restarted.?Wt:03/20/22 ???72.6 kg????????????Bed03/29/23 ???72.4 kg????????????Bed12/23/19????98.6 kg?ESTIMATED NUTRITION REQUIREMENTS:Kcal/day:?1825??????????????(25 ?kcal/kg)Protein/day:?73-87 ?????????????(1-1.2gm/kg)Fluids/day:?1825 mL/d?(70mL/kcal) ONLY as medical and fluid status allows and only per team discretion. Needs based on:?Dosing Wt (73?kg), maintenance.??NUTRITION ASSESSMENT:?PT EMR reviewed for follow up assessment. Pt labs and meds reviewed. Pt reported to have continued good tolerance to tube feed regimen. Pt SLP eval cleared pt for regular solids/ thin liquids. Pt with good tolerance to oral intake with continue minimal intake reported over this past week. Tube feeds remain appropriate to continue to support nutrition estiatmed needs. No Tube feed intolerances reported over this past week and no interruptions reported to suggest pt did not meet needs this last week.  ??Labs-?BUN/Cr- 39.7 from 36.4. ???Cultural/Religious/Ethnic Needs:?Unknown??Food Allergies: NKFA??NUTRITION DIAGNOSIS:Severe malnutrition r/t depletion aeb moderate muscle and fat depletion.- continues??INTERVENTIONS/RECOMMENDATIONS:?Meals and Snacks:-?Continue Regular?diet- would consider removing additional vitamin and mineral supplementations in setting of receiving full goal rate supportive feeds.?Goal:?po intake?for comfort given support provided by?goal?volume bolus feeds, continue?Nutrition supplement:No need for oral supplements??Enteral Nutrition:?	Consider?JEVITY 1.5?@ 1.5?carton bolus BID (@9am ,1pm)?and?1 carton?BID (5pm,?9pm)?to total 5 cartons daily-	Provides: 1.185L, 1778kcal, 76gm protein, free water-	Additional free water per team. Add'l 877 mL provides 1 mL/kcal fedD/w covering provider?Goal: ?-	Patient will receive >90% of goal volume TF/ TPN prior to next nutrition assessment-?met will continue? ??Discharge Planning and Transfer of Nutrition Care:??Nutrition related discharge needs still being determined at this time, will continue to follow ??MONITORING / EVALUATION:?Food/Nutrition-Related History:Food and Nutrient Intake?Anthropometric Measurements:Weight ?Biochemical Data, Medical Tests and Procedures:Electrolyte and renal profileGlucose/endocrine profile?Nutrition-Focus Physical FindingsOverall findingsElectronically signed by Sherrilee Gilles, RD, May 1, 2023[facility]- [phone] Please note: Nutrition is a consult only service on weekends and holidays.  Please enter a consult in EPIC if assistance is needed on a weekend or holiday or via [facility] [phone] to contact the covering RD.

## 2022-04-21 NOTE — Plan of Care
Plan of Care Overview/ Patient Status    Uneventful shift. Patient aware it is her birthday today, in good spirits. VSS on RA. Denies pain at rest however BLE pain left>right with movement. Repositioned/elevated with pillows and given tramadol with good effect. Ilestomy x2, right with large loose output. Purewick draining adequate CY urine. PO intake remains poor however somewhat improved today - tolerated cereal for breakfast, frequently hydrating with OJ/ginger ale and having birthday cake this evening. Family at bedside. Safety maintained.

## 2022-04-21 NOTE — Plan of Care
Inpatient Physical Therapy Progress Note IP Adult PT Eval/Treat - 04/21/22 1152    Date of Visit / Treatment  Date of Visit / Treatment 04/21/22   Note Type Progress Note   Progress Report Due 04/30/22   End Time 1152    General Information  Subjective [place]!   General Observations Pt received supine in bed; on RA; NAD; B ileostomies; purwick; RN cleared   Precautions/Limitations Fall Precautions;Bed alarm   Precautions/Limitations Comment Aspiration precaution    Weight Bearing Status  Weight Bearing Status Comments WBAT BLE    Vital Signs and Orthostatic Vital Signs  Vital Signs Vital Signs Stable   Vital Signs Free text RA; NAD    Pain/Comfort  Pain Comment (Pre/Post Treatment Pain) Pt c/o unquantified predominantly LLE pain to heel region w/ touch and mobilization; RN aware and repositioned for comfort    Patient Coping  Observed Emotional State accepting;cooperative   Verbalized Emotional State acceptance    Cognition  Overall Cognitive Status Impaired   Orientation Level Oriented to person;Oriented to place;Oriented to time   A&0x2-3. Aware she's in [facility] but calls this place [facility]. Aware today is 04/21/22, but says day of the week is Thursday and not Monday. Requires reorientation and cues  Level of Consciousness alert;confused   Following Commands Follows one step commands without difficulty   Personal Safety / Judgment Fall risk;Requires supervision with mobility   Attention Requires redirection;Requires cues   Cognition Comments Pleasant; requires motivation and edu to progress and participate at times    Range of Motion  Range of Motion Comments WFL, tight B HM    Manual Muscle Testing  Manual Muscle Testing Comments + Deconditioned    Skin Assessment  Skin Assessment See Nursing Documentation    Balance  Sitting Balance: Static  POOR+    Minimal assist to maintain static position with no Assistive Device   Sitting Balance: Dynamic  POOR+   Moves through 1/2 range with minimal assist to right self   Standing Balance: Static POOR-     Maximal assist to maintain static position with no Assistive Device   Standing Balance: Dynamic  POOR-    Unable to move from midline voluntarily, maximal assist to right self   Balance Assist Device Hand held assist   Balance Skills Training Comment Ax1    Bed Mobility  Supine-to-Sit Independence/Assistance Level Maximum assist;Assist of 2   Supine-to-Sit Assist Device Hand held assist;Head of bed elevated;Draw pad;Bed rails   Sit-to-Supine Independence/Assistance Level Maximum assist;Assist of 2   Sit-to-Supine Assist Device Head of bed elevated;Hand held assist   Limitations decreased ability to use legs for bridging/pushing   Bed Mobility, Impairments strength decreased;sensory feedback impaired;pain   Bed mobility was limited by: impaired motor function;pain   Bed Mobility Comments Increased retropuslion and L lateral lean during session; requires VCs and TCs to correct; performed AAROM B LAQ and cross-body reaches EOB    Publishing rights manager Transfer Comments Deferred d/t pain and weakness    Handoff Documentation  Handoff Patient in bed;Bed alarm;Patient instructed to call nursing for mobility;Discussed with nursing    Endurance  Endurance Comments fair-    PT- AM-PAC - Basic Mobility Screen- How much help from another person do you currently need.....  Turning from your back to your side while in a a flat bed without using rails? 1 - Total - Requires total assistance or cannot do it at all.   Moving from lying on  your back to sitting on the side of a flat bed without using bed rails? 1 - Total - Requires total assistance or cannot do it at all.   Moving to and from a bed to a chair (including a wheelchair)? 1 - Total - Requires total assistance or cannot do it at all. Standing up from a chair using your arms(e.g., wheelchair or bedside chair)? 1 - Total - Requires total assistance or cannot do it at all.   To walk in a hospital room? 1 - Total - Requires total assistance or cannot do it at all.   Climbing 3-5 steps with a railing? 1 - Total - Requires total assistance or cannot do it at all.   AMPAC Mobility Score 6   TARGET Highest Level of Mobility Mobility Level 2, Turn self in bed/bed activity/dependent transfer    ACTUAL Highest Level of Mobility Mobility Level 3, Sit on edge of bed    Therapeutic Exercise  Therapeutic Exercise Comments AAROM B LAQ seated EOB; ankle pumps; AROM BUE    Neuromuscular Re-education  Neuromuscular Re-education Comments Seated postural control training - cross body reaches    Therapeutic Functional Activity  Therapeutic Functional Activity Comments Bed mobility; Pt edu    Clinical Impression  Follow up Assessment Pt seen for PT f/u. Required maxAx2 for bed mobility. Noted increased retropulsion and L lateral lean during today's session requiring VCs and TCs to correct. Will benefit from skilled therapy to improve gross functional ROM/strength, seated static/dynamic postural control, and activity tolerance. Current PT d/c rec is STR.   Criteria for Skilled Therapeutic Interventions Met yes    Patient/Family Stated Goals  Patient/Family Stated Goal(s) decrease pain;feel better    Frequency/Equipment Recommendations  PT Frequency 2x per week   What day of week is next treatment expected? Thursday   5/4  PT/PTA completing this assessment RL   Equipment Needs During Admission/Treatment No device    PT Recommendations for Inpatient Admission  Activity/Level of Assist dangle edge of bed;assist of 2   lift oob   Planned Treatment / Interventions  Plan for Next Visit Progress as tol   Training Treatment / Interventions Balance / Investment banker, operational;Endurance Training;Functional Personnel officer / Interventions Patient Education / Training    PT Discharge Summary  Physical Therapy Disposition Recommendation Short Term Rehab     Shanon Payor PT, [place]: [phone]

## 2022-04-21 NOTE — Progress Notes
[facility]Med[facility] Progress NoteAttending Provider: Marlon Pel, MDSubjective: Reason for Admission: Persistent encephalopathyInterim History: Patient resting in bed, reports some left shoulder as she has been turned onto that side, patient adjusted. Review of Meds: Scheduled:Current Facility-Administered Medications Medication Dose Route Frequency Provider Last Rate Last Admin ? apixaban (ELIQUIS) tablet 5 mg  5 mg Oral Q12H Pryor Ochoa, MD   5 mg at 04/21/22 4098 ? Banatrol Plus oral powder packet  1 packet Oral BID Pryor Ochoa, MD   1 packet at 04/21/22 1191 ? camphor-menthoL [facility]) lotion   Topical (Top) BID Manzon, Berkley Harvey, MD   Given at 04/21/22 0825 ? famotidine (PEPCID) tablet 20 mg  20 mg Oral BID Pryor Ochoa, MD   20 mg at 04/21/22 4782 ? ferrous gluconate (FERGON) tablet 324 mg  324 mg Oral BID WC Pryor Ochoa, MD   324 mg at 04/21/22 9562 ? folic acid (FOLVITE) tablet 1 mg  1 mg Oral Daily Pryor Ochoa, MD   1 mg at 04/21/22 1308 ? gabapentin (NEURONTIN) capsule 100 mg  100 mg Oral Nightly Kang-Giaimo, Devona Konig, MD   100 mg at 04/20/22 2007 ? lidocaine (LIDODERM) 5 % 1 patch  1 patch Transdermal Q24H Simeon Craft, MD   1 patch at 04/20/22 2007 ? melatonin tablet 3 mg  3 mg Oral Nightly Pryor Ochoa, MD   3 mg at 04/20/22 2007 ? niacin Immediate Release tablet 100 mg  100 mg Oral Nightly Pryor Ochoa, MD   100 mg at 04/20/22 2008 ? nystatin (MYCOSTATIN) powder 1 Application  1 Application Topical (Top) BID Henriette Combs, MD   1 Application at 04/21/22 [phone] ? pyridoxine (vitamin B6) (B-6) tablet 100 mg  100 mg Oral Daily Pryor Ochoa, MD   100 mg at 04/21/22 4696 ? [Held by provider] rosuvastatin (CRESTOR) tablet 10 mg  10 mg Per G Tube Nightly Dorisann Frames, MD   10 mg at 02/14/22 2245 ? sodium chloride 0.9 % flush 3 mL  3 mL IV Push Q8H Dorisann Frames, MD   3 mL at 04/21/22 0530 ? thiamine (VITAMIN B1) tablet 100 mg  100 mg Oral BID Pryor Ochoa, MD   100 mg at 04/21/22 2952 PRN: acetaminophen, dextrose (GLUCOSE) oral liquid 15 g **OR** fruit juice **OR** skim milk, dextrose (GLUCOSE) oral liquid 30 g **OR** fruit juice, dextrose injection, dextrose injection, gabapentin, glucagon, ondansetron **OR** ondansetron (ZOFRAN) IV Push, polyethylene glycol, sodium chloride, traMADoLContinuous Infusions: Objective: Vitals:Temp:  [98 ?F (36.7 ?C)-98.6 ?F (37 ?C)] 98.6 ?F (37 ?C)Pulse:  [79-94] 79Resp:  [16-18] 18BP: (97-123)/(62-77) 115/77SpO2:  [95 %-97 %] 95 %Device (Oxygen Therapy): room airI/O:Gross Totals (Last 24 hours) at 04/21/2022 0838Last data filed at 04/21/2022 0600Intake 1157 ml Output 1875 ml Net -718 ml Physical Exam:	Constitutional:  NAD, well-nourishedHENT: Normocephalic, atraumatic. No scleral icterus. Neck with normal range of motion.CARD: Normal rate, regular rhythm. Normal heart sounds, intact pulses.PULML:  Effort normal, breath sounds normal. GI: Soft, nontender, nondistended. EXT: No edemaSKIN: Warm and dry. NEURO: Alert and oriented to self, baseline. LLE foot drop, ble weaknessPSYCH: Mood and affect normal. Labs:Recent Labs Lab 04/25/230335 04/28/230552 WBC 6.3 5.7 HGB 10.3* 10.4* HCT 33.00* 33.30* PLT 421* 404  No results for input(s): NEUTROPHILS, LABLYMP, LABEOS, BANDSP in the last 168 hours. Recent Labs Lab 04/25/230335 04/28/230552 04/30/230517 NA 137 137 138 K 4.1 3.9 4.2 CL 107 106 107 CO2 24 22 23  BUN 12 9 11  CREATININE 0.33* 0.36* 0.37* GLU 94  98 95 ANIONGAP 6* 9 8  Recent Labs Lab 04/25/230335 04/28/230552 04/30/230517 CALCIUM 9.8 10.1 9.8  No results for input(s): ALT, AST, ALKPHOS, BILITOT, BILIDIR in the last 168 hours. No results for input(s): PTT, LABPROT, INR in the last 168 hours. FINGERSTICKS: Recent Labs Lab 04/25/230335 04/28/230552 04/30/230517 GLU 94 98 95 Micro:Lab Results Component Value Date  LABBLOO No Growth after 5 days of incubation 04/05/2022  LABBLOO No Growth after 5 days of incubation 04/05/2022  LABBLOO No Growth after 5 days of incubation 04/02/2022  LABBLOO No Growth after 5 days of incubation 04/02/2022  LABBLOO No Growth after 5 days of incubation 02/12/2022  LABBLOO No Growth after 5 days of incubation 02/12/2022 Diagnostics:Assessment: Danielle [name] is a 66yo female with a history of rheumatoid arthritis on methotrexate and recent prolonged hospitalization for 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 Patient required LTAC placement. Course complicated by intermittent fevers without clear infectious source found, fevers resolved after course of abx. Further complicated by elevated LFTs likely secondary to DILI from IV abx. Now patient is medically ready and pending T19/LTC placement. Plan: Recurrent feverR foot cellulitistransietn fevers have resolved - last fever 4/15, no leukocytosisBlood cultures 4/12 no growth finalS/p course of CTX and zosyn -> oxacillinPodiatry has signed off - wound healing archived, continue prevalon boots	Continue tylenol prn Hypercalcemia resolvedAcute on chronicPTH, PTHrP, vit D, TSH and random cortisol WNL?secondayr to immobility or primary hyperparathyroidismEndocrinology has signed off S/p Zolendronic acid 4mg  x1 on 3/21Acute encephalopathy resolvedLE weaknessUnclear etiology, likely prolonged delirium in setting of prolonged hospitalization back in November and again nowMRI brain and total spine, LP, paraneoplastic and autoimmune encephalopathy panels all negativeNeurology has signed off - recommended EMG to complete workup of LE weakness but patient declined, otherwise no further recommendations 	Overall improved now AAO 1-2 which appears to be new baseline 	Continue gabapentin 100mg  QHS	T19 and conservatorship hearing scheduled for 5/11Perforated diverticulumS/p R hemicolectomy, end ileostomyLFT elevation - now thought as above to be secondary to DILI from IV abxC/b fluid abscess 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, but repeat imaging was reviewed and found to be improving and so likely resolution of continued debris R lateral and L medial eminence deep tissue injuriesXray with degenerative changes but no acute findingsPodiatry has signed off - WBAT, follow up outpatientChronic conditionsBilateral DVT - apixaban 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, elevated FLC ratio felt to be reactive, plasma cell disorder unlikely ?rheumatoid arthirtis - has been off of MTX since November/acute illness, rheumatology has signed off follow up outpatient no plans to restart medication at this timeDysphagia - resolved, SLP following, aspiraiton precautions# Hospital Issues- F: none- E: replete PRN- N: Tube feed with regular tray- GI: bowel regimen- PPx: apixaban- Code Status: Full code # Dispo: Pending conservatorship hearing on 5/11I spent 35 minutes today on this encounter before, during and after the visit, reviewing labs and records, evaluating and examining the patient, entering orders and documenting the visit. Signed:Coron [name] MDHospitalist Medicine Attending5/1/20238:38 AM

## 2022-04-22 NOTE — Plan of Care
Plan of Care Overview/ Patient Status    1900-0700:Patient is A&O x2, not to time and situation. VSS on RA. C/o pain to LLE especially with movement & palpitation- tramadol given with good effect.R ileostomy with soft brown output. L ileostomy continues with no output.Incontinent of urine. Purewick in place.Gtube- Night bolus feed given. No residual.Took pills whole with no issues swallowing.Ax2 in bed. On envision bed. Heels elevated. T&RP every 2 hours.No issues overnight.See f/s for more details.Problem: Adult Inpatient Plan of CareGoal: Plan of Care ReviewOutcome: Interventions implemented as appropriateGoal: Patient-Specific Goal (Individualized)Outcome: Interventions implemented as appropriateGoal: Absence of Hospital-Acquired Illness or InjuryOutcome: Interventions implemented as appropriateGoal: Optimal Comfort and WellbeingOutcome: Interventions implemented as appropriateGoal: Readiness for Transition of CareOutcome: Interventions implemented as appropriate

## 2022-04-22 NOTE — Plan of Care
Plan of Care Overview/ Patient Status    1900-0700:Patient is A&O x2, not to time and situation. VSS on RA. C/o pain to LLE especially with movement & palpitation- tramadol given with good effect.R ileostomy with soft brown output. L ileostomy continues with no output.Incontinent of urine. Purewick in place.Gtube- Night bolus feed given. No residual.Took pills whole with no issues swallowing.Ax2 in bed. On envision bed. Heels elevated. T&RP every 2 hours.No issues overnight.See f/s for more details.

## 2022-04-22 NOTE — Plan of Care
Plan of Care Overview/ Patient Status    0700 - 1900 Patient alert and oriented x 3 with periods of confusion. Vital signs stable, room air. Assist X 2 in bed. Q 4 hour Jevity 1.5 cal bolus feeds with tray.  Lung sounds clear, bowel sounds present. IV in left arm patent and intact. Ileostomy on RLQ and LLQ; RLQ with stool and flatus, LLQ without flatus and stool. Ileostomy to RLQ emptied; see intake/output flowsheet. Patient reports pain to left ankle, pain management provided. Repositioned Q 2 hours. Problem: Adult Inpatient Plan of CareGoal: Plan of Care Review5/01/2022 1635 by Yetta Barre, RNFlowsheets (Taken 04/22/2022 1635)Progress: improvingPlan of Care Reviewed With: patient5/01/2022 1635 by Yetta Barre, RNOutcome: Interventions implemented as appropriate Problem: Adult Inpatient Plan of CareGoal: Patient-Specific Goal (Individualized)04/22/2022 1635 by Yetta Barre, RNFlowsheets (Taken 04/22/2022 0957)What Anxieties, Fears, Concerns or Questions Do You Have About Your Care?: Left leg pain.Individualized Preferences and Care Needs: Per POC.Patient Centered Long Term Goal: Sfae D/C.Patient Centered Daily Goal: Pain management.04/22/2022 1635 by Yetta Barre, RNOutcome: Interventions implemented as appropriate

## 2022-04-22 NOTE — Progress Notes
[facility] HealthMed[facility] Progress NoteAttending Provider: Marlon Pel, MDSubjective: Reason for Admission: Persistent encephalopathyInterim History: Patient resting comfortably in bed with no acute complaints. Review of Meds: Scheduled:Current Facility-Administered Medications Medication Dose Route Frequency Provider Last Rate Last Admin ? apixaban (ELIQUIS) tablet 5 mg  5 mg Oral Q12H Pryor Ochoa, MD   5 mg at 04/21/22 2000 ? Banatrol Plus oral powder packet  1 packet Oral BID Pryor Ochoa, MD   1 packet at 04/21/22 2053 ? camphor-menthoL (SARNA) lotion   Topical (Top) BID Manzon, Berkley Harvey, MD   Given at 04/21/22 2053 ? famotidine (PEPCID) tablet 20 mg  20 mg Oral BID Pryor Ochoa, MD   20 mg at 04/21/22 2000 ? ferrous gluconate (FERGON) tablet 324 mg  324 mg Oral BID WC Pryor Ochoa, MD   324 mg at 04/21/22 1755 ? folic acid (FOLVITE) tablet 1 mg  1 mg Oral Daily Pryor Ochoa, MD   1 mg at 04/21/22 0960 ? gabapentin (NEURONTIN) capsule 100 mg  100 mg Oral Nightly Kang-Giaimo, Devona Konig, MD   100 mg at 04/21/22 2000 ? lidocaine (LIDODERM) 5 % 1 patch  1 patch Transdermal Q24H Simeon Craft, MD   1 patch at 04/21/22 2000 ? melatonin tablet 3 mg  3 mg Oral Nightly Pryor Ochoa, MD   3 mg at 04/21/22 2000 ? niacin Immediate Release tablet 100 mg  100 mg Oral Nightly Pryor Ochoa, MD   100 mg at 04/21/22 2000 ? nystatin (MYCOSTATIN) powder 1 Application  1 Application Topical (Top) BID Henriette Combs, MD   1 Application at 04/21/22 2053 ? pyridoxine (vitamin B6) (B-6) tablet 100 mg  100 mg Oral Daily Pryor Ochoa, MD   100 mg at 04/21/22 4540 ? [Held by provider] rosuvastatin (CRESTOR) tablet 10 mg  10 mg Per G Tube Nightly Dorisann Frames, MD   10 mg at 02/14/22 2245 ? sodium chloride 0.9 % flush 3 mL  3 mL IV Push Q8H Dorisann Frames, MD   3 mL at 04/22/22 9811 ? thiamine (VITAMIN B1) tablet 100 mg  100 mg Oral BID Pryor Ochoa, MD   100 mg at 04/21/22 2000 PRN: acetaminophen, dextrose (GLUCOSE) oral liquid 15 g **OR** fruit juice **OR** skim milk, dextrose (GLUCOSE) oral liquid 30 g **OR** fruit juice, dextrose injection, dextrose injection, gabapentin, glucagon, ondansetron **OR** ondansetron (ZOFRAN) IV Push, polyethylene glycol, sodium chloride, traMADoLContinuous Infusions: Objective: Vitals:Temp:  [97.3 ?F (36.3 ?C)-99.4 ?F (37.4 ?C)] 99.1 ?F (37.3 ?C)Pulse:  [73-91] 87Resp:  [18] 18BP: (102-111)/(64-74) 104/68SpO2:  [95 %-99 %] 95 %Device (Oxygen Therapy): room airI/O:Gross Totals (Last 24 hours) at 04/22/2022 0819Last data filed at 04/22/2022 0500Intake 1911 ml Output 1475 ml Net 436 ml Physical Exam:	Constitutional:  NAD, well-nourishedHENT: Normocephalic, atraumatic. No scleral icterus. Neck with normal range of motion.CARD: Normal rate, regular rhythm. Normal heart sounds, intact pulses.PULML:  Effort normal, breath sounds normal. GI: Soft, nontender, nondistended. EXT: No edemaSKIN: Warm and dry. NEURO: Alert and oriented to self, baseline. LLE foot drop, ble weaknessPSYCH: Mood and affect normal. Labs:Recent Labs Lab 04/28/230552 WBC 5.7 HGB 10.4* HCT 33.30* PLT 404  No results for input(s): NEUTROPHILS, LABLYMP, LABEOS, BANDSP in the last 168 hours. Recent Labs Lab 04/28/230552 04/30/230517 NA 137 138 K 3.9 4.2 CL 106 107 CO2 22 23 BUN 9 11 CREATININE 0.36* 0.37* GLU 98 95 ANIONGAP 9 8  Recent Labs Lab 04/28/230552 04/30/230517 CALCIUM 10.1 9.8  No results for input(s): ALT, AST, ALKPHOS,  BILITOT, BILIDIR in the last 168 hours. No results for input(s): PTT, LABPROT, INR in the last 168 hours. FINGERSTICKS: Recent Labs Lab 04/28/230552 04/30/230517 GLU 98 95 Micro:Lab Results Component Value Date  LABBLOO No Growth after 5 days of incubation 04/05/2022  LABBLOO No Growth after 5 days of incubation 04/05/2022  LABBLOO No Growth after 5 days of incubation 04/02/2022  LABBLOO No Growth after 5 days of incubation 04/02/2022  LABBLOO No Growth after 5 days of incubation 02/12/2022  LABBLOO No Growth after 5 days of incubation 02/12/2022 Diagnostics:Assessment: Ms Rump is a 66yo female with a history of rheumatoid arthritis on methotrexate and recent prolonged hospitalization for 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 Patient required LTAC placement. Course complicated by intermittent fevers without clear infectious source found, fevers resolved after course of abx. Further complicated by elevated LFTs likely secondary to DILI from IV abx. Now patient is medically ready and pending T19/LTC placement. Plan: Recurrent feverR foot cellulitistransietn fevers have resolved - last fever 4/15, no leukocytosisBlood cultures 4/12 no growth finalS/p course of CTX and zosyn -> oxacillinPodiatry has signed off - wound healing archived, continue prevalon boots	Continue tylenol prn Hypercalcemia resolvedAcute on chronicPTH, PTHrP, vit D, TSH and random cortisol WNL?secondayr to immobility or primary hyperparathyroidismEndocrinology has signed off S/p Zolendronic acid 4mg  x1 on 3/21Acute encephalopathy resolvedLE weaknessUnclear etiology, likely prolonged delirium in setting of prolonged hospitalization back in November and again nowMRI brain and total spine, LP, paraneoplastic and autoimmune encephalopathy panels all negativeNeurology has signed off - recommended EMG to complete workup of LE weakness but patient declined, otherwise no further recommendations 	Overall improved now AAO 1-2 which appears to be new baseline 	Continue gabapentin 100mg  QHS, statin on hold due to LE pain consider restarting when resolved/improved 	PT recommending STR 	T19 and conservatorship hearing scheduled for 5/11Perforated diverticulumS/p R hemicolectomy, end ileostomyLFT elevation - now thought as above to be secondary to DILI from IV abxC/b fluid abscess 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, but repeat imaging was reviewed and found to be improving and so likely resolution of continued debris R lateral and L medial eminence deep tissue injuriesXray with degenerative changes but no acute findingsPodiatry has signed off - WBAT, follow up outpatientChronic conditionsBilateral DVT - apixaban 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, elevated FLC ratio felt to be reactive, plasma cell disorder unlikely ?rheumatoid arthirtis - has been off of MTX since November/acute illness, rheumatology has signed off follow up outpatient no plans to restart medication at this timeDysphagia - resolved, SLP following, aspiraiton precautions# Hospital Issues- F: none- E: replete PRN- N: Tube feed with regular tray- GI: bowel regimen- PPx: apixaban- Code Status: Full code # Dispo: Pending conservatorship hearing on 5/11, then STR placement Medical decision making for this patient was lowSigned:Mckinlee Dunk [facility] MDHospitalist Medicine Attending5/2/20238:38 AM

## 2022-04-23 MED ORDER — GABAPENTIN 100 MG CAPSULE
100 mg | Freq: Three times a day (TID) | ORAL | Status: DC
Start: 2022-04-23 — End: 2022-07-24
  Administered 2022-04-23 – 2022-07-24 (×274): 100 mg via ORAL

## 2022-04-23 NOTE — Progress Notes
[facility] HealthMed[facility] Progress NoteAttending Provider: Marlon Pel, MDSubjective: Reason for Admission: Persistent encephalopathyInterim History: Patient with chronic L leg pain this morning receiving pain medication now, otherwise no acute events overnight Review of Meds: Scheduled:Current Facility-Administered Medications Medication Dose Route Frequency Provider Last Rate Last Admin ? apixaban (ELIQUIS) tablet 5 mg  5 mg Oral Q12H Pryor Ochoa, MD   5 mg at 04/22/22 2027 ? Banatrol Plus oral powder packet  1 packet Oral BID Pryor Ochoa, MD   1 packet at 04/22/22 2027 ? camphor-menthoL [facility]) lotion   Topical (Top) BID Manzon, Berkley Harvey, MD   Given at 04/22/22 2027 ? famotidine (PEPCID) tablet 20 mg  20 mg Oral BID Pryor Ochoa, MD   20 mg at 04/22/22 2027 ? ferrous gluconate (FERGON) tablet 324 mg  324 mg Oral BID WC Pryor Ochoa, MD   324 mg at 04/22/22 1800 ? folic acid (FOLVITE) tablet 1 mg  1 mg Oral Daily Pryor Ochoa, MD   1 mg at 04/22/22 0950 ? gabapentin (NEURONTIN) capsule 100 mg  100 mg Oral Nightly Kang-Giaimo, Devona Konig, MD   100 mg at 04/22/22 2026 ? lidocaine (LIDODERM) 5 % 1 patch  1 patch Transdermal Q24H Simeon Craft, MD   1 patch at 04/22/22 2026 ? melatonin tablet 3 mg  3 mg Oral Nightly Pryor Ochoa, MD   3 mg at 04/22/22 2027 ? niacin Immediate Release tablet 100 mg  100 mg Oral Nightly Pryor Ochoa, MD   100 mg at 04/22/22 2026 ? nystatin (MYCOSTATIN) powder 1 Application  1 Application Topical (Top) BID Henriette Combs, MD   1 Application at 04/22/22 1100 ? pyridoxine (vitamin B6) (B-6) tablet 100 mg  100 mg Oral Daily Pryor Ochoa, MD   100 mg at 04/22/22 0950 ? [Held by provider] rosuvastatin (CRESTOR) tablet 10 mg  10 mg Per G Tube Nightly Dorisann Frames, MD   10 mg at 02/14/22 2245 ? sodium chloride 0.9 % flush 3 mL  3 mL IV Push Q8H Dorisann Frames, MD   3 mL at 04/22/22 1610 ? thiamine (VITAMIN B1) tablet 100 mg  100 mg Oral BID Pryor Ochoa, MD   100 mg at 04/22/22 2027 PRN: acetaminophen, dextrose (GLUCOSE) oral liquid 15 g **OR** fruit juice **OR** skim milk, dextrose (GLUCOSE) oral liquid 30 g **OR** fruit juice, dextrose injection, dextrose injection, gabapentin, glucagon, ondansetron **OR** ondansetron (ZOFRAN) IV Push, polyethylene glycol, sodium chloride, traMADoLContinuous Infusions: Objective: Vitals:Temp:  [97.5 ?F (36.4 ?C)-98.6 ?F (37 ?C)] 97.9 ?F (36.6 ?C)Pulse:  [81-93] 81Resp:  [16-20] 16BP: (100-127)/(63-85) 109/70SpO2:  [96 %-99 %] 96 %Device (Oxygen Therapy): room airI/O:Gross Totals (Last 24 hours) at 04/23/2022 0812Last data filed at 04/23/2022 0534Intake 1606 ml Output 1330 ml Net 276 ml Physical Exam:	Constitutional:  NAD, well-nourishedHENT: Normocephalic, atraumatic. No scleral icterus. Neck with normal range of motion.CARD: Normal rate, regular rhythm. Normal heart sounds, intact pulses.PULML:  Effort normal, breath sounds normal. GI: Soft, nontender, nondistended. EXT: No edemaSKIN: Warm and dry. NEURO: Alert and oriented to self, baseline. LLE foot drop, ble weaknessPSYCH: Mood and affect normal. Labs:Recent Labs Lab 04/28/230552 WBC 5.7 HGB 10.4* HCT 33.30* PLT 404  No results for input(s): NEUTROPHILS, LABLYMP, LABEOS, BANDSP in the last 168 hours. Recent Labs Lab 04/28/230552 04/30/230517 NA 137 138 K 3.9 4.2 CL 106 107 CO2 22 23 BUN 9 11 CREATININE 0.36* 0.37* GLU 98 95 ANIONGAP 9 8  Recent Labs Lab 04/28/230552 04/30/230517 CALCIUM 10.1 9.8  No results for input(s): ALT, AST, ALKPHOS, BILITOT, BILIDIR in the last 168 hours. No results for input(s): PTT, LABPROT, INR in the last 168 hours. FINGERSTICKS: Recent Labs Lab 04/28/230552 04/30/230517 GLU 98 95 Micro:Lab Results Component Value Date  LABBLOO No Growth after 5 days of incubation 04/05/2022  LABBLOO No Growth after 5 days of incubation 04/05/2022  LABBLOO No Growth after 5 days of incubation 04/02/2022  LABBLOO No Growth after 5 days of incubation 04/02/2022  LABBLOO No Growth after 5 days of incubation 02/12/2022  LABBLOO No Growth after 5 days of incubation 02/12/2022 Diagnostics:Assessment: Ms Kirshner is a 66yo female with a history of rheumatoid arthritis on methotrexate and recent prolonged hospitalization for 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 Patient required LTAC placement. Course complicated by intermittent fevers without clear infectious source found, fevers resolved after course of abx. Further complicated by elevated LFTs likely secondary to DILI from IV abx. Now patient is medically ready and pending T19/LTC placement. Plan: Recurrent feverR foot cellulitistransient fevers have resolved - last fever 4/15, no leukocytosisBlood cultures 4/12 no growth finalS/p course of CTX and zosyn -> oxacillinPodiatry has signed off - wound healing archived, continue prevalon boots	Continue tylenol prn Hypercalcemia resolvedAcute on chronic, ?secondayr to immobility or primary hyperparathyroidismPTH, PTHrP, vit D, TSH and random cortisol WNLEndocrinology has signed off S/p Zolendronic acid 4mg  x1 on 3/21Acute encephalopathy resolvedLE weaknessUnclear etiology, likely prolonged delirium in setting of prolonged hospitalization back in November and again nowMRI brain and total spine, LP, paraneoplastic and autoimmune encephalopathy panels all negativeNeurology has signed off - recommended EMG to complete workup of LE weakness but patient declined, otherwise no further recommendations 	Overall improved now AAO 1-2 which appears to be new baseline Gabapentin 100mg  TID, statin on hold due to LE pain consider restarting when resolved/improved 	PT recommending STR 	T19 and conservatorship hearing scheduled for 5/11Perforated diverticulumS/p R hemicolectomy, end ileostomyLFT elevation - now thought as above to be secondary to DILI from IV abxC/b fluid abscess 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, but repeat imaging was reviewed and found to be improving and so likely resolution of continued debris R lateral and L medial eminence deep tissue injuriesXray with degenerative changes but no acute findingsPodiatry has signed off - WBAT, follow up outpatient	Tylenol and tramadol prn Chronic conditionsBilateral DVT - apixaban 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, elevated FLC ratio felt to be reactive, plasma cell disorder unlikely ?rheumatoid arthirtis - has been off of MTX since November/acute illness, rheumatology has signed off follow up outpatient no plans to restart medication at this timeDysphagia - resolved, SLP following, aspiraiton precautions# Hospital Issues- F: none- E: replete PRN- N: Tube feed with regular tray- GI: bowel regimen- PPx: apixaban- Code Status: Full code # Dispo: Pending conservatorship hearing on 5/11, then STR placement Medical decision making for this patient was moderateSigned:Gene Glazebrook [facility] MDHospitalist Medicine Attending5/3/20238:38 AM

## 2022-04-23 NOTE — Plan of Care
Plan of Care Overview/ Patient Status    1900-0700 Alert and oriented x2-3. VSS on RA. PRN tramadol and gabapentin given for LLE pain with good effect. Bolus feed given as ordered via PEG. Pt also on regular diet. Right ileostomy intact. Left mucus fistula ostomy pouch intact. Purewick in place. Incontinence care provided as needed. Turned and repositioned q2h. Awaiting conservator hearing on May 11. Safety maintained. Hourly rounding completed. See flowsheets for assessments.Problem: Adult Inpatient Plan of CareGoal: Plan of Care ReviewOutcome: Interventions implemented as appropriateFlowsheets (Taken 04/23/2022 0017)Progress: no changePlan of Care Reviewed With: patient

## 2022-04-23 NOTE — Plan of Care
Plan of Care Overview/ Patient Status    Problem: Adult Inpatient Plan of CareGoal: Readiness for Transition of CareOutcome: Interventions implemented as appropriate LOS note 71 daysPt remains medically stable. Awaiting conservator hearing scheduled for 05/01/22 @ 11:15 am. Once sister is appointed as Horticulturist, commercial, CM team can move forward with LTC placement. T-19 application has been submitted and is pending approval. Elinor Parkinson, RN, MSNCare ManagerMHB: [phone]

## 2022-04-24 NOTE — Plan of Care
Plan of Care Overview/ Patient Status    [name]&O to self, vss, no complaint of pain. PO intake improving MD aware. . TF administered as ordered. Tolerating well. Incontinent of urine , care provided.?Illeostomy output 150 ml this shift.??Podus boots to BL feet. At times requests to take them off. Heels off loaded on pillows. Oral care provided as ordered.  T&P q 2hrs as tolerated. On a specialty bed. Safety maintained, bed alarm on, call bell in reach.

## 2022-04-24 NOTE — Progress Notes
Inpatient Occupational Therapy Progress NoteDefault Flowsheet Data (most recent)   IP Adult OT Eval/Treat - 04/24/22 1001    Date of Visit / Treatment  Date of Visit / Treatment 04/24/22   Note Type Progress Note   Progress Report Due 05/08/22   End Time 1001    General Information  Pertinent History Of Current Problem Per chart: 66 y.o. 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  Was deemed to lack capaciity, however required ethics consult as family difficult to contact for medical decision making, s/p PEG placement at that time.  Course further c/b b/l DVT, started on AC.  Now presented to [facility] as family unable to care for her, found to be febrile/tachycardic   Precautions/Limitations Fall Precautions    Weight Bearing Status  Weight Bearing Status Comments WBAT BLE    Vital Signs and Orthostatic Vital Signs  Vital Signs Vital Signs Stable    Pain/Comfort  Location #1 - PreTreatment Rating (Numbers Scale) 0/10 - no pain   Posttreatment Rating (Numbers Scale) 0/10 - no pain    Patient Coping  Observed Emotional State accepting   Verbalized Emotional State acceptance    Cognition  Orientation Level Oriented to person   Level of Consciousness alert;confused   Following Commands Follows one step commands with repetition   Personal Safety / Judgment Fall risk   Short Term Memory Requires cues   Long Term Memory Requires cues   Attention Sustained impairment    Vision/ Hearing  Vision Assessment Results No vision deficits noted    Musculoskeletal  LUE Muscle Strength Grading 2-->active movement with gravity eliminated   RUE Muscle Strength Grading 2-->active movement with gravity eliminated   LLE Muscle Strength Grading 2-->active movement with gravity eliminated   RLE Muscle Strength Grading 2-->active movement with gravity eliminated    Muscle Tone  Muscle Tone Testing Results No muscle tone deficits noted    Sensory Assessment  Sensory Tests Results No sensory impairment noted    Skin Assessment  Skin Assessment See Nursing Documentation    Balance  Sitting Balance: Static  POOR+    Minimal assist to maintain static position with no Assistive Device   Sitting Balance: Dynamic  POOR+   Moves through 1/2 range with minimal assist to right self   Standing Balance: Static POOR-     Maximal assist to maintain static position with no Assistive Device   Standing Balance: Dynamic  POOR-    Unable to move from midline voluntarily, maximal assist to right self     AM-PAC - Daily Activity IP Short Form  Help needed from another person putting on/taking off regular lower body clothing 2 - A Lot   Help needed from another person for bathing (incl. washing, rinsing, drying) 2 - A Lot   Help needed from another person for toileting (incl. using toilet, bedpan, urinal) 2 - A Lot   Help needed from another person putting on/taking off regular upper body clothing 2 - A Lot   Help needed from another person taking care of personal grooming such as brushing teeth 2 - A Lot   Help needed from another person eating meals 2 - A Lot   AM-PAC Daily Activity Raw Score (Total of rows above) 12   CMS Score (based on Raw Score - with G Code) 12 -  66.57% impaired      (G Code - CL)    Bed Mobility  Rolling/Turning Right - Independence/Assistance Level Moderate assist   Rolling/Turning Left - Independence/Assistance Level Moderate assist   Supine-to-Sit Independence/Assistance Level Maximum assist    Sit-Stand Transfer Training  Sit-to-Stand Transfer Independence/Assistance Level Maximum assist   Sit-to-Stand Transfer Assist Device Hand held assist    Handoff Documentation  Handoff Patient in bed;Bed alarm    Endurance  Endurance Comments fair    Therapeutic Functional Activity  Therapeutic Functional Activity Detailed Documentation Developmental Handling    Clinical Impression  Follow up Assessment Pyt tolerating EOB activities and first step of mobility progression    Frequency/Equipment Recommendations  OT Frequency 2x per week   What day of week is next treatment expected? Monday   5/8  OT/OTA completing this assessment GT    OT Discharge Summary  Disposition Recommendation Short Term Rehab

## 2022-04-24 NOTE — Plan of Care
Plan of Care Overview/ Patient Status    [name]&O to self, vss, no complaint of pain. PO intake improving. . TF administered as ordered. Tolerating well. Incontinent of urine , care provided. Illeostomy output 100 ml this shift.  Podus boots to BL feet. At times requests to take them off. Heels off loaded on pillows. Oral care provided as ordered.  T&P q 2hrs as tolerated. On a specialty bed. Safety maintained, bed alarm on, call bell in reach.

## 2022-04-24 NOTE — Progress Notes
[facility] HealthMed[facility] Progress NoteAttending Provider: Marlon Pel, MDSubjective: Reason for Admission: Persistent encephalopathyInterim History: Patient doing ok this morning resting comfortably in bed, leg pain has improved. She has also been noted to be eating much more off her trays recently. Additionally scant blood noted around peg tube site, no active bleeding noted will continue monitoringReview of Meds: Scheduled:Current Facility-Administered Medications Medication Dose Route Frequency Provider Last Rate Last Admin ? apixaban (ELIQUIS) tablet 5 mg  5 mg Oral Q12H Pryor Ochoa, MD   5 mg at 04/23/22 2008 ? Banatrol Plus oral powder packet  1 packet Oral BID Pryor Ochoa, MD   1 packet at 04/23/22 2021 ? camphor-menthoL (SARNA) lotion   Topical (Top) BID Manzon, Berkley Harvey, MD   Given at 04/23/22 2018 ? famotidine (PEPCID) tablet 20 mg  20 mg Oral BID Pryor Ochoa, MD   20 mg at 04/23/22 2008 ? ferrous gluconate (FERGON) tablet 324 mg  324 mg Oral BID WC Pryor Ochoa, MD   324 mg at 04/23/22 1851 ? folic acid (FOLVITE) tablet 1 mg  1 mg Oral Daily Pryor Ochoa, MD   1 mg at 04/23/22 0915 ? gabapentin (NEURONTIN) capsule 100 mg  100 mg Oral TID Marlon Pel, MD   100 mg at 04/23/22 2008 ? lidocaine (LIDODERM) 5 % 1 patch  1 patch Transdermal Q24H Simeon Craft, MD   1 patch at 04/23/22 2010 ? melatonin tablet 3 mg  3 mg Oral Nightly Pryor Ochoa, MD   3 mg at 04/23/22 2008 ? niacin Immediate Release tablet 100 mg  100 mg Oral Nightly Pryor Ochoa, MD   100 mg at 04/23/22 2008 ? nystatin (MYCOSTATIN) powder 1 Application  1 Application Topical (Top) BID Henriette Combs, MD   1 Application at 04/23/22 2019 ? pyridoxine (vitamin B6) (B-6) tablet 100 mg  100 mg Oral Daily Pryor Ochoa, MD   100 mg at 04/23/22 0915 ? [Held by provider] rosuvastatin (CRESTOR) tablet 10 mg  10 mg Per G Tube Nightly Dorisann Frames, MD   10 mg at 02/14/22 2245 ? sodium chloride 0.9 % flush 3 mL  3 mL IV Push Q8H Dorisann Frames, MD   3 mL at 04/24/22 6578 ? thiamine (VITAMIN B1) tablet 100 mg  100 mg Oral BID Pryor Ochoa, MD   100 mg at 04/23/22 2008 PRN: acetaminophen, dextrose (GLUCOSE) oral liquid 15 g **OR** fruit juice **OR** skim milk, dextrose (GLUCOSE) oral liquid 30 g **OR** fruit juice, dextrose injection, dextrose injection, gabapentin, glucagon, ondansetron **OR** ondansetron (ZOFRAN) IV Push, polyethylene glycol, sodium chloride, traMADoLContinuous Infusions: Objective: Vitals:Temp:  [97.4 ?F (36.3 ?C)-99 ?F (37.2 ?C)] 97.4 ?F (36.3 ?C)Pulse:  [78-91] 78Resp:  [18-20] 20BP: (105-115)/(70-75) 111/73SpO2:  [96 %-98 %] 98 %Device (Oxygen Therapy): room airI/O:Gross Totals (Last 24 hours) at 04/24/2022 0844Last data filed at 04/24/2022 0600Intake 1487 ml Output 450 ml Net 1037 ml Physical Exam:	Constitutional:  NAD, well-nourishedHENT: Normocephalic, atraumatic. No scleral icterus. Neck with normal range of motion.CARD: Normal rate, regular rhythm. Normal heart sounds, intact pulses.PULML:  Effort normal, breath sounds normal. GI: Soft, nontender, nondistended. PEG and ostomy in placeEXT: No edemaSKIN: Warm and dry. NEURO: Alert and oriented to self, baseline. LLE foot drop, ble weaknessPSYCH: Mood and affect normal. Labs:Recent Labs Lab 04/28/230552 WBC 5.7 HGB 10.4* HCT 33.30* PLT 404  No results for input(s): NEUTROPHILS, LABLYMP, LABEOS, BANDSP in the last 168 hours. Recent Labs Lab 04/28/230552 04/30/230517 NA 137 138 K 3.9 4.2  CL 106 107 CO2 22 23 BUN 9 11 CREATININE 0.36* 0.37* GLU 98 95 ANIONGAP 9 8  Recent Labs Lab 04/28/230552 04/30/230517 CALCIUM 10.1 9.8  No results for input(s): ALT, AST, ALKPHOS, BILITOT, BILIDIR in the last 168 hours. No results for input(s): PTT, LABPROT, INR in the last 168 hours. FINGERSTICKS: Recent Labs Lab 04/28/230552 04/30/230517 GLU 98 95 Micro:Lab Results Component Value Date  LABBLOO No Growth after 5 days of incubation 04/05/2022  LABBLOO No Growth after 5 days of incubation 04/05/2022  LABBLOO No Growth after 5 days of incubation 04/02/2022  LABBLOO No Growth after 5 days of incubation 04/02/2022  LABBLOO No Growth after 5 days of incubation 02/12/2022  LABBLOO No Growth after 5 days of incubation 02/12/2022 Diagnostics:Assessment: Ms Brothers is a 66yo female with a history of rheumatoid arthritis on methotrexate and recent prolonged hospitalization for 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 Patient required LTAC placement. Course complicated by intermittent fevers without clear infectious source found, fevers resolved after course of abx. Further complicated by elevated LFTs likely secondary to DILI from IV abx. Now patient is medically ready and pending T19/LTC placement. Plan: Recurrent feverR foot cellulitistransient fevers have resolved - last fever 4/15, no leukocytosisBlood cultures 4/12 no growth finalS/p course of CTX and zosyn -> oxacillinPodiatry has signed off - wound healing archived, continue prevalon boots	Continue tylenol prn Hypercalcemia resolvedAcute on chronic, ?secondayr to immobility or primary hyperparathyroidismPTH, PTHrP, vit D, TSH and random cortisol WNLEndocrinology has signed off S/p Zolendronic acid 4mg  x1 on 3/21Acute encephalopathy resolvedLE weaknessUnclear etiology, likely prolonged delirium in setting of prolonged hospitalization back in November and again nowMRI brain and total spine, LP, paraneoplastic and autoimmune encephalopathy panels all negativeNeurology has signed off - recommended EMG to complete workup of LE weakness but patient declined, otherwise no further recommendations 	Overall improved now AAO 1-2 which appears to be new baseline 	Gabapentin 100mg  TID, statin on hold due to LE pain consider restarting when resolved/improved 	PT recommending STR 	T19 and conservatorship hearing scheduled for 5/11Perforated diverticulumS/p R hemicolectomy, end ileostomyLFT elevation - now thought as above to be secondary to DILI from IV abxC/b fluid abscess 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, but repeat imaging was reviewed and found to be improving and so likely resolution of continued debris R lateral and L medial eminence deep tissue injuriesXray with degenerative changes but no acute findingsPodiatry has signed off - WBAT, follow up outpatient	Tylenol and tramadol prn Tube feedsDysphagia resolved	SLP following continue aspiration precautions	Tube feeds with regular trays 	Patient has been eating more off her trays this week	Nutrition reconsulted to possibly decrease tube feeds as she is taking in more PO 	Scant blood around peg tube site noted on dressing today, no active bleed noted on exam, continue monitoringChronic conditionsBilateral DVT - apixaban 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, elevated FLC ratio felt to be reactive, plasma cell disorder unlikely ?rheumatoid arthirtis - has been off of MTX since November/acute illness, rheumatology has signed off follow up outpatient no plans to restart medication at this time# Hospital Issues- F: none- E: replete PRN- N: Tube feed with regular tray- GI: bowel regimen- PPx: apixaban- Code Status: Full code # Dispo: Pending conservatorship hearing on 5/11, then STR placement Medical decision making for this patient was moderateSigned:Thorvald Orsino [facility] MDHospitalist Medicine Attending5/4/20238:38 AM

## 2022-04-24 NOTE — Plan of Care
Inpatient Physical Therapy Progress Note IP Adult PT Eval/Treat - 04/24/22 0949    Date of Visit / Treatment  Date of Visit / Treatment 04/24/22   Note Type Progress Note   Progress Report Due 04/30/22   End Time 0949    General Information  Subjective I can't believe this is happening.   Other Providers OT, OT student d/t case complexity and incr need for assist   General Observations Pt received supine in bed; on RA; NAD; purwick; B ileostomies; RN cleared   Precautions/Limitations Fall Precautions;Bed alarm   Precautions/Limitations Comment Aspiration Precaution    Weight Bearing Status  Weight Bearing Status Comments WBAT BLE    Vital Signs and Orthostatic Vital Signs  Vital Signs Vital Signs Stable   Vital Signs Free text RA; NAD    Pain/Comfort  Pain Comment (Pre/Post Treatment Pain) No c/o pain pre/post session. Pt extremely sensitive to LT and w/ mobility to BLE (L>R); RN aware and repositioned for comfort.    Patient Coping  Observed Emotional State accepting;cooperative   Verbalized Emotional State acceptance    Cognition  Overall Cognitive Status Impaired   Orientation Level Oriented to person;Disoriented to place;Oriented to time;Disoriented to situation   Level of Consciousness alert;confused   Following Commands Follows one step commands without difficulty   Personal Safety / Judgment Fall risk;Requires supervision with mobility   Attention Requires redirection;Requires cues   Cognition Comments Pleasant    Range of Motion  Range of Motion Comments WFL    Manual Muscle Testing  Manual Muscle Testing Comments + Deconditioned    Skin Assessment  Skin Assessment See Nursing Documentation    Balance  Sitting Balance: Static  POOR+    Minimal assist to maintain static position with no Assistive Device   Sitting Balance: Dynamic  POOR+   Moves through 1/2 range with minimal assist to right self Standing Balance: Static POOR-     Maximal assist to maintain static position with no Assistive Device   Balance Assist Device Hand held assist   Balance Skills Training Comment Ax3    Bed Mobility  Supine-to-Sit Independence/Assistance Level Maximum assist;Assist of 2   Supine-to-Sit Assist Device Hand held assist;Head of bed elevated;Draw pad;Bed rails   Sit-to-Supine Independence/Assistance Level Maximum assist;Assist of 2   Sit-to-Supine Assist Device Hand held assist;Head of bed elevated   Limitations decreased ability to use legs for bridging/pushing   Bed Mobility, Impairments ROM decreased;strength decreased;impaired balance;sensory feedback impaired;pain   Bed mobility was limited by: impaired motor function;cognition;pain   Bed Mobility Comments Retropulsive w/ R lateral lean seated EOB requiring minA to prevent LOB    Sit-Stand Transfer Training  Sit-to-Stand Transfer Independence/Assistance Level Maximum assist   Assist of 3  Sit-to-Stand Transfer Assist Device Hand held assist   Stand-to-Sit Transfer Independence/Assistance Level Maximum assist   Assist of 3  Stand-to-Sit Transfer Assist Device Hand held assist   Transfer Safety Analysis Concerns cues for hand placement;decreased weight-shifting ability   Transfer Safety Analysis Impairments impaired balance;pain;decreased strength;decreased ROM;impaired sensory feedback   Transfers were limited by: impaired motor function;pain;cognition   Sit-Stand Transfer Comments STS w/ Ax3; unable to achieve full upright despite VCs and TCs; poor standing tolerance    Gait Training  Gait Training Comments NT d/t weakness, cognition, and unsteadiness    Handoff Documentation  Handoff Patient in bed;Bed alarm;Patient instructed to call nursing for mobility;Discussed with nursing    Endurance  Endurance Comments Fair-    PT- AM-PAC - Basic Mobility  Screen- How much help from another person do you currently need.....  Turning from your back to your side while in a a flat bed without using rails? 1 - Total - Requires total assistance or cannot do it at all.   Moving from lying on your back to sitting on the side of a flat bed without using bed rails? 1 - Total - Requires total assistance or cannot do it at all.   Moving to and from a bed to a chair (including a wheelchair)? 1 - Total - Requires total assistance or cannot do it at all.   Standing up from a chair using your arms(e.g., wheelchair or bedside chair)? 1 - Total - Requires total assistance or cannot do it at all.   To walk in a hospital room? 1 - Total - Requires total assistance or cannot do it at all.   Climbing 3-5 steps with a railing? 1 - Total - Requires total assistance or cannot do it at all.   AMPAC Mobility Score 6   TARGET Highest Level of Mobility Mobility Level 2, Turn self in bed/bed activity/dependent transfer    ACTUAL Highest Level of Mobility Mobility Level 3, Sit on edge of bed    Therapeutic Exercise  Therapeutic Exercise Comments AAROM B LAQ seated EOB; AROM BUE    Neuromuscular Re-education  Neuromuscular Re-education Comments Seated postural control training    Therapeutic Functional Activity  Therapeutic Functional Activity Comments Bed mobility; transfer training; Pt edu    Clinical Impression  Follow up Assessment Pt seen for PT f/u. Requires maxAx2-3 for bed mobility and STS training from edge of bed. Displays retropulsion and R lateral lean seated EOB, requiring assist to prevent LOB. Will benefit from skilled services to improve gross functional ROM/strength, postural control/balance, and activity tolerance. Current PT d/c rec is STR.   Criteria for Skilled Therapeutic Interventions Met yes    Patient/Family Stated Goals  Patient/Family Stated Goal(s) decrease pain;return to prior level of functioning    Frequency/Equipment Recommendations  PT Frequency 2x per week What day of week is next treatment expected? Monday   5/8  PT/PTA completing this assessment RL   Equipment Needs During Admission/Treatment No device    PT Recommendations for Inpatient Admission  Activity/Level of Assist dangle edge of bed;assist of 2   lift oob   Planned Treatment / Interventions  Plan for Next Visit Progress as tol   Training Treatment / Interventions Balance / Investment banker, operational;Endurance Training;Functional Personnel officer / Interventions Patient Education / Training    PT Discharge Summary  Physical Therapy Disposition Recommendation Short Term Rehab     Shanon Payor PT, [place]: [phone]

## 2022-04-24 NOTE — Plan of Care
Plan of Care Overview/ Patient Status    Pt A+Ox2-3, RA, VSS. Pt slept well overnight. Ostomy bags changed. Noted bleeding/redness/swelling under G tube insertion site. Drain sponge changed twice overnight. Good UOP from purewick. Pt had pain in BLE this AM with activity, tramadol given with good relief. No complaints/questions. Problem: Adult Inpatient Plan of CareGoal: Plan of Care ReviewOutcome: Interventions implemented as appropriateGoal: Patient-Specific Goal (Individualized)Outcome: Interventions implemented as appropriateGoal: Absence of Hospital-Acquired Illness or InjuryOutcome: Interventions implemented as appropriateGoal: Optimal Comfort and WellbeingOutcome: Interventions implemented as appropriateGoal: Readiness for Transition of CareOutcome: Interventions implemented as appropriate Problem: InfectionGoal: Absence of Infection Signs and SymptomsOutcome: Interventions implemented as appropriate Problem: Mobility ImpairmentGoal: Optimal MobilityOutcome: Interventions implemented as appropriate Problem: Skin Injury Risk IncreasedGoal: Skin Health and IntegrityOutcome: Interventions implemented as appropriate Problem: Infection (Ileostomy)Goal: Absence of Infection Signs and SymptomsOutcome: Interventions implemented as appropriate Problem: Fluid, Electrolyte and Nutrition Imbalance (Ileostomy)Goal: Fluid, Electrolyte and Nutrition BalanceOutcome: Interventions implemented as appropriate

## 2022-04-24 NOTE — Plan of Care
Nutrition Consult note:CTSP for eval of calorie counts. Pt intake reported to have improved. Pt last three day intake reported and found to have averaged only 200 kcal per day. It would be reasonable to remove one bolus feeding formula to assess if pt appetite may allow more food intake. Pt not likely to be hungry while receiving full goal volume tube feeds. Removing 1 bolus from regimen will provide 70% of goal needs. Would remove 1pm feeding and continue calorie counts over this weekend.  INTERVENTIONS/RECOMMENDATIONS:?Meals and Snacks:-?Continue Regular?diet- continue calorie counts over weekend 5/5-5/7- to be assessed on Monday 5/8?Enteral Nutrition:?	Consider?JEVITY 1.5?@ 1.5?carton bolus @9am ?and?1 carton?BID (5pm,?9pm)?to total 3.5 cartons daily-	Provides: , 1244 kcal, 53gm protein, free water

## 2022-04-25 NOTE — Plan of Care
Plan of Care Overview/ Patient Status    Oral intake encouraged. Patient very tearful throughout the day. Taken to healing garden this afternoon with help of physical therapy for wheelchair transfer. Tube feeds and medications administered as ordered. Tramadol administered x1 for pain. Call light within reach, bed alarm on.Problem: Adult Inpatient Plan of CareGoal: Plan of Care ReviewOutcome: Interventions implemented as appropriate

## 2022-04-25 NOTE — Plan of Care
Plan of Care Overview/ Patient Status    No acute events overnight. Pt slept well. C/o pain in BLE with movement, tramadol given x1. TF administered. Purewick in place with good effect.  Pills whole with water--pt takes all meds at once with no difficulty. Next ostomy bag change due for 5/8. Problem: Adult Inpatient Plan of CareGoal: Plan of Care ReviewOutcome: Interventions implemented as appropriateGoal: Patient-Specific Goal (Individualized)Outcome: Interventions implemented as appropriateGoal: Absence of Hospital-Acquired Illness or InjuryOutcome: Interventions implemented as appropriateGoal: Optimal Comfort and WellbeingOutcome: Interventions implemented as appropriateGoal: Readiness for Transition of CareOutcome: Interventions implemented as appropriate Problem: InfectionGoal: Absence of Infection Signs and SymptomsOutcome: Interventions implemented as appropriate Problem: Violence Risk or ActualGoal: Anger and Impulse ControlOutcome: Interventions implemented as appropriate Problem: Fall Injury RiskGoal: Absence of Fall and Fall-Related InjuryOutcome: Interventions implemented as appropriate Problem: Mobility ImpairmentGoal: Optimal MobilityOutcome: Interventions implemented as appropriate Problem: Skin Injury Risk IncreasedGoal: Skin Health and IntegrityOutcome: Interventions implemented as appropriate

## 2022-04-25 NOTE — Progress Notes
[facility] HealthMedicine Progress NoteAttending Provider: Marlon Pel, MDSubjective: Reason for Admission: Persistent encephalopathyInterim History: Patient feeling upset about general situation and being stuck in side, staff is going to try to take her to healing garden later today. No other acute complaintsReview of Meds: Scheduled:Current Facility-Administered Medications Medication Dose Route Frequency Provider Last Rate Last Admin ? apixaban (ELIQUIS) tablet 5 mg  5 mg Oral Q12H Pryor Ochoa, MD   5 mg at 04/24/22 2148 ? Banatrol Plus oral powder packet  1 packet Oral BID Pryor Ochoa, MD   1 packet at 04/24/22 2156 ? camphor-menthoL (SARNA) lotion   Topical (Top) BID Manzon, Berkley Harvey, MD   Given at 04/24/22 2156 ? famotidine (PEPCID) tablet 20 mg  20 mg Oral BID Pryor Ochoa, MD   20 mg at 04/24/22 2148 ? ferrous gluconate (FERGON) tablet 324 mg  324 mg Oral BID WC Pryor Ochoa, MD   324 mg at 04/24/22 1810 ? folic acid (FOLVITE) tablet 1 mg  1 mg Oral Daily Pryor Ochoa, MD   1 mg at 04/24/22 4782 ? gabapentin (NEURONTIN) capsule 100 mg  100 mg Oral TID Marlon Pel, MD   100 mg at 04/24/22 2148 ? lidocaine (LIDODERM) 5 % 1 patch  1 patch Transdermal Q24H Simeon Craft, MD   1 patch at 04/24/22 2210 ? melatonin tablet 3 mg  3 mg Oral Nightly Pryor Ochoa, MD   3 mg at 04/24/22 2148 ? niacin Immediate Release tablet 100 mg  100 mg Oral Nightly Pryor Ochoa, MD   100 mg at 04/24/22 2148 ? nystatin (MYCOSTATIN) powder 1 Application  1 Application Topical (Top) BID Henriette Combs, MD   1 Application at 04/24/22 2156 ? pyridoxine (vitamin B6) (B-6) tablet 100 mg  100 mg Oral Daily Pryor Ochoa, MD   100 mg at 04/24/22 9562 ? [Held by provider] rosuvastatin (CRESTOR) tablet 10 mg  10 mg Per G Tube Nightly Dorisann Frames, MD   10 mg at 02/14/22 2245 ? sodium chloride 0.9 % flush 3 mL  3 mL IV Push Q8H Dorisann Frames, MD   3 mL at 04/24/22 2148 ? thiamine (VITAMIN B1) tablet 100 mg  100 mg Oral BID Pryor Ochoa, MD   100 mg at 04/24/22 2148 PRN: acetaminophen, dextrose (GLUCOSE) oral liquid 15 g **OR** fruit juice **OR** skim milk, dextrose (GLUCOSE) oral liquid 30 g **OR** fruit juice, dextrose injection, dextrose injection, gabapentin, glucagon, ondansetron **OR** ondansetron (ZOFRAN) IV Push, polyethylene glycol, sodium chloride, traMADoLContinuous Infusions: Objective: Vitals:Temp:  [98 ?F (36.7 ?C)-99 ?F (37.2 ?C)] 99 ?F (37.2 ?C)Pulse:  [85-87] 87Resp:  [18-20] 18BP: (109-118)/(73-74) 118/73SpO2:  [99 %] 99 %Device (Oxygen Therapy): room airI/O:Gross Totals (Last 24 hours) at 04/25/2022 0836Last data filed at 04/25/2022 0000Intake 1497 ml Output 360 ml Net 1137 ml Physical Exam:	Constitutional:  NAD, well-nourishedHENT: Normocephalic, atraumatic. No scleral icterus. Neck with normal range of motion.CARD: Normal rate, regular rhythm. Normal heart sounds, intact pulses.PULML:  Effort normal, breath sounds normal. GI: Soft, nontender, nondistended. PEG and ostomy in placeEXT: No edemaSKIN: Warm and dry. NEURO: Alert and oriented to self, baseline. LLE foot drop, ble weaknessPSYCH: Mood and affect normal. Labs:No results for input(s): WBC, HGB, HCT, PLT in the last 168 hours. No results for input(s): NEUTROPHILS, LABLYMP, LABEOS, BANDSP in the last 168 hours. Recent Labs Lab 04/30/230517 NA 138 K 4.2 CL 107 CO2 23 BUN 11 CREATININE 0.37* GLU 95 ANIONGAP 8  Recent Labs Lab 04/30/230517 CALCIUM  9.8  No results for input(s): ALT, AST, ALKPHOS, BILITOT, BILIDIR in the last 168 hours. No results for input(s): PTT, LABPROT, INR in the last 168 hours. 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 Patient required LTAC placement. Course complicated by intermittent fevers without clear infectious source found, fevers resolved after course of abx. Further complicated by elevated LFTs likely secondary to DILI from IV abx. Now patient is medically ready and pending T19/LTC placement. Plan: Recurrent feverR foot cellulitistransient fevers have resolved - last fever 4/15, no leukocytosisBlood cultures 4/12 no growth finalS/p course of CTX and zosyn -> oxacillinPodiatry has signed off - wound healing archived, continue prevalon boots	Continue tylenol prn Hypercalcemia resolvedAcute on chronic, ?secondayr to immobility or primary hyperparathyroidismPTH, PTHrP, vit D, TSH and random cortisol WNLEndocrinology has signed off S/p Zolendronic acid 4mg  x1 on 3/21Acute encephalopathy resolvedLE weaknessUnclear etiology, likely prolonged delirium in setting of prolonged hospitalization back in November and again nowMRI brain and total spine, LP, paraneoplastic and autoimmune encephalopathy panels all negativeNeurology has signed off - recommended EMG to complete workup of LE weakness but patient declined, otherwise no further recommendations 	Overall improved now AAO 1-2 which appears to be new baseline 	Gabapentin 100mg  TID, statin on hold due to LE pain consider restarting when resolved/improved 	PT recommending STR 	T19 and conservatorship hearing scheduled for 5/11Perforated diverticulumS/p R hemicolectomy, end ileostomyLFT elevation - now thought as above to be secondary to DILI from IV abxC/b fluid abscess [place]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, but repeat imaging was reviewed and found to be improving and so likely resolution of continued debris R lateral and L medial eminence deep tissue injuriesXray with degenerative changes but no acute findingsPodiatry has signed off - WBAT, follow up outpatient	Tylenol and tramadol prn Tube feedsDysphagia resolved	SLP following continue aspiration precautions	Tube feeds with regular trays 	Patient has been eating more off her trays this week	Nutrition reconsulted to possibly decrease tube feeds as she is taking in more PO 	Scant blood around peg tube site noted on dressing today, no active bleed noted on exam, continue monitoringChronic conditionsBilateral DVT - apixaban 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

## 2022-04-26 LAB — CBC WITH AUTO DIFFERENTIAL
BKR WAM ABSOLUTE IMMATURE GRANULOCYTES.: 0.02 x 1000/ÂµL (ref 0.00–0.30)
BKR WAM ABSOLUTE LYMPHOCYTE COUNT.: 1.82 x 1000/ÂµL (ref 0.60–3.70)
BKR WAM ABSOLUTE NRBC (2 DEC): 0 x 1000/ÂµL (ref 0.00–1.00)
BKR WAM ANALYZER ANC: 3.93 x 1000/ÂµL (ref 2.00–7.60)
BKR WAM BASOPHIL ABSOLUTE COUNT.: 0.03 x 1000/ÂµL (ref 0.00–1.00)
BKR WAM BASOPHILS: 0.4 % (ref 0.0–1.4)
BKR WAM EOSINOPHIL ABSOLUTE COUNT.: 0.32 x 1000/ÂµL (ref 0.00–1.00)
BKR WAM EOSINOPHILS: 4.5 % (ref 0.0–5.0)
BKR WAM HEMATOCRIT (2 DEC): 32.6 % — ABNORMAL LOW (ref 35.00–45.00)
BKR WAM HEMOGLOBIN: 10.6 g/dL — ABNORMAL LOW (ref 11.7–15.5)
BKR WAM IMMATURE GRANULOCYTES: 0.3 % (ref 0.0–1.0)
BKR WAM LYMPHOCYTES: 25.5 % (ref 17.0–50.0)
BKR WAM MCH (PG): 27.1 pg (ref 27.0–33.0)
BKR WAM MCHC: 32.5 g/dL (ref 31.0–36.0)
BKR WAM MCV: 83.4 fL (ref 80.0–100.0)
BKR WAM MONOCYTE ABSOLUTE COUNT.: 1.02 x 1000/ÂµL — ABNORMAL HIGH (ref 0.00–1.00)
BKR WAM MONOCYTES: 14.3 % — ABNORMAL HIGH (ref 4.0–12.0)
BKR WAM MPV: 10.9 fL (ref 8.0–12.0)
BKR WAM NEUTROPHILS: 55 % (ref 39.0–72.0)
BKR WAM NUCLEATED RED BLOOD CELLS: 0 % (ref 0.0–1.0)
BKR WAM PLATELETS: 316 x1000/ÂµL (ref 150–420)
BKR WAM RDW-CV: 16.3 % — ABNORMAL HIGH (ref 11.0–15.0)
BKR WAM RED BLOOD CELL COUNT.: 3.91 M/ÂµL — ABNORMAL LOW (ref 4.00–6.00)
BKR WAM WHITE BLOOD CELL COUNT: 7.1 x1000/ÂµL (ref 4.0–11.0)

## 2022-04-26 LAB — COMPREHENSIVE METABOLIC PANEL
BKR A/G RATIO: 0.7 — ABNORMAL LOW (ref 1.0–2.2)
BKR ALANINE AMINOTRANSFERASE (ALT): 9 U/L — ABNORMAL LOW (ref 10–35)
BKR ALBUMIN: 3.2 g/dL — ABNORMAL LOW (ref 3.6–4.9)
BKR ALKALINE PHOSPHATASE: 76 U/L (ref 9–122)
BKR ANION GAP: 10 (ref 7–17)
BKR ASPARTATE AMINOTRANSFERASE (AST): 38 U/L — ABNORMAL HIGH (ref 10–35)
BKR AST/ALT RATIO: 4.2
BKR BILIRUBIN TOTAL: 0.3 mg/dL (ref <=1.2)
BKR BLOOD UREA NITROGEN: 11 mg/dL (ref 8–23)
BKR BUN / CREAT RATIO: 35.5 — ABNORMAL HIGH (ref 8.0–23.0)
BKR CALCIUM: 9.5 mg/dL (ref 8.8–10.2)
BKR CHLORIDE: 101 mmol/L (ref 98–107)
BKR CO2: 22 mmol/L (ref 20–30)
BKR CREATININE: 0.31 mg/dL — ABNORMAL LOW (ref 0.40–1.30)
BKR EGFR, CREATININE (CKD-EPI 2021): >60 mL/min/{1.73_m2} (ref >=60)
BKR GLOBULIN: 4.8 g/dL — ABNORMAL HIGH (ref 2.3–3.5)
BKR GLUCOSE: 106 mg/dL — ABNORMAL HIGH (ref 70–100)
BKR POTASSIUM: 4.8 mmol/L (ref 3.3–5.3)
BKR PROTEIN TOTAL: 8 g/dL (ref 6.6–8.7)
BKR SODIUM: 133 mmol/L — ABNORMAL LOW (ref 136–144)

## 2022-04-26 LAB — MAGNESIUM: BKR MAGNESIUM: 1.8 mg/dL (ref 1.7–2.4)

## 2022-04-26 LAB — PHOSPHORUS     (BH GH L LMW YH): BKR PHOSPHORUS: 2.5 mg/dL (ref 2.2–4.5)

## 2022-04-26 NOTE — Progress Notes
[facility] HealthMedicine Progress NoteAttending Provider: Marlon Pel, MDSubjective: Reason for Admission: Persistent encephalopathyInterim History: Patient doing ok today resting comfortably in bed, states she wants to work with physical therapy more often and we discussed plan for STR placement when ableReview of Meds: Scheduled:Current Facility-Administered Medications Medication Dose Route Frequency Provider Last Rate Last Admin ? apixaban (ELIQUIS) tablet 5 mg  5 mg Oral Q12H Pryor Ochoa, MD   5 mg at 04/25/22 2015 ? Banatrol Plus oral powder packet  1 packet Oral BID Pryor Ochoa, MD   1 packet at 04/25/22 2017 ? camphor-menthoL [facility]) lotion   Topical (Top) BID Manzon, Berkley Harvey, MD   Given at 04/25/22 2018 ? famotidine (PEPCID) tablet 20 mg  20 mg Oral BID Pryor Ochoa, MD   20 mg at 04/25/22 2015 ? ferrous gluconate (FERGON) tablet 324 mg  324 mg Oral BID WC Pryor Ochoa, MD   324 mg at 04/25/22 1820 ? folic acid (FOLVITE) tablet 1 mg  1 mg Oral Daily Pryor Ochoa, MD   1 mg at 04/25/22 0908 ? gabapentin (NEURONTIN) capsule 100 mg  100 mg Oral TID Marlon Pel, MD   100 mg at 04/25/22 2015 ? lidocaine (LIDODERM) 5 % 1 patch  1 patch Transdermal Q24H Simeon Craft, MD   1 patch at 04/25/22 2015 ? melatonin tablet 3 mg  3 mg Oral Nightly Pryor Ochoa, MD   3 mg at 04/25/22 2015 ? niacin Immediate Release tablet 100 mg  100 mg Oral Nightly Pryor Ochoa, MD   100 mg at 04/25/22 2015 ? nystatin (MYCOSTATIN) powder 1 Application  1 Application Topical (Top) BID Henriette Combs, MD   1 Application at 04/25/22 2018 ? pyridoxine (vitamin B6) (B-6) tablet 100 mg  100 mg Oral Daily Pryor Ochoa, MD   100 mg at 04/25/22 0908 ? [Held by provider] rosuvastatin (CRESTOR) tablet 10 mg  10 mg Per G Tube Nightly Dorisann Frames, MD   10 mg at 02/14/22 2245 ? sodium chloride 0.9 % flush 3 mL 3 mL IV Push Q8H Dorisann Frames, MD   3 mL at 04/24/22 2148 ? thiamine (VITAMIN B1) tablet 100 mg  100 mg Oral BID Pryor Ochoa, MD   100 mg at 04/25/22 2015 PRN: acetaminophen, dextrose (GLUCOSE) oral liquid 15 g **OR** fruit juice **OR** skim milk, dextrose (GLUCOSE) oral liquid 30 g **OR** fruit juice, dextrose injection, dextrose injection, gabapentin, glucagon, ondansetron **OR** ondansetron (ZOFRAN) IV Push, polyethylene glycol, sodium chloride, traMADoLContinuous Infusions: Objective: Vitals:Temp:  [98.1 ?F (36.7 ?C)-100.1 ?F (37.8 ?C)] 98.1 ?F (36.7 ?C)Pulse:  [80-98] 80Resp:  [18-20] 20BP: (99-116)/(64-72) 99/64SpO2:  [96 %-100 %] 96 %Device (Oxygen Therapy): room airI/O:Gross Totals (Last 24 hours) at 04/26/2022 0851Last data filed at 04/26/2022 0530Intake 480 ml Output 520 ml Net -40 ml Physical Exam:	Constitutional:  NAD, well-nourishedHENT: Normocephalic, atraumatic. No scleral icterus. Neck with normal range of motion.CARD: Normal rate, regular rhythm. Normal heart sounds, intact pulses.PULML:  Effort normal, breath sounds normal. GI: Soft, nontender, nondistended. PEG and ostomy in placeEXT: No edemaSKIN: Warm and dry. NEURO: Alert and oriented to self, baseline. LLE foot drop, ble weaknessPSYCH: Mood and affect normal. Labs:Recent Labs Lab 05/06/230431 WBC 7.1 HGB 10.6* HCT 32.60* PLT 316  Recent Labs Lab 05/06/230431 NEUTROPHILS 55.0  Recent Labs Lab 04/30/230517 05/06/230431 NA 138 133* K 4.2 4.8 CL 107 101 CO2 23 22 BUN 11 11 CREATININE 0.37* 0.31* GLU 95 106* ANIONGAP 8 10  Recent Labs Lab  04/30/230517 05/06/230431 CALCIUM 9.8 9.5 MG  --  1.8 PHOS  --  2.5  Recent Labs Lab 05/06/230431 ALT 9* AST 38* ALKPHOS 76 BILITOT 0.3  No results for input(s): PTT, LABPROT, INR in the last 168 hours. FINGERSTICKS: Recent Labs Lab 04/30/230517 05/06/230431 GLU 95 106* Micro:Lab Results Component Value Date  LABBLOO No Growth after 5 days of incubation 04/05/2022  LABBLOO No Growth after 5 days of incubation 04/05/2022  LABBLOO No Growth after 5 days of incubation 04/02/2022  LABBLOO No Growth after 5 days of incubation 04/02/2022  LABBLOO No Growth after 5 days of incubation 02/12/2022  LABBLOO No Growth after 5 days of incubation 02/12/2022 Diagnostics:Assessment: Ms Slatten is a 66yo female with a history of rheumatoid arthritis on methotrexate and recent prolonged hospitalization for 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 Patient required LTAC placement. Course complicated by intermittent fevers without clear infectious source found, fevers resolved after course of abx. Further complicated by elevated LFTs likely secondary to DILI from IV abx. Now patient is medically ready and pending T19/LTC placement. Plan: Recurrent feverR foot cellulitis resolvedtransient fevers have resolved - last fever 4/15, no leukocytosisBlood cultures 4/12 no growth finalS/p course of CTX and zosyn -> oxacillinPodiatry has signed off - wound healing archived, continue prevalon boots	Continue tylenol prn Hypercalcemia resolvedAcute on chronic, ?secondayr to immobility or primary hyperparathyroidismPTH, PTHrP, vit D, TSH and random cortisol WNLEndocrinology has signed off S/p Zolendronic acid 4mg  x1 on 3/21Acute encephalopathy resolvedLE weaknessUnclear etiology, likely prolonged delirium in setting of prolonged hospitalization back in November and again nowMRI brain and total spine, LP, paraneoplastic and autoimmune encephalopathy panels all negativeNeurology has signed off - recommended EMG to complete workup of LE weakness but patient declined, otherwise no further recommendations 	Overall improved now AAO 1-2 which appears to be new baseline 	Gabapentin 100mg  TID, statin on hold due to LE pain consider restarting when resolved/improved 	PT recommending STR 	T19 and conservatorship hearing scheduled for 5/11Perforated diverticulumS/p R hemicolectomy, end ileostomyLFT elevation - now thought as above to be secondary to DILI from IV abxC/b fluid abscess 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, but repeat imaging was reviewed and found to be improving and so likely resolution of continued debris R lateral and L medial eminence deep tissue injuriesXray with degenerative changes but no acute findingsPodiatry has signed off - WBAT, follow up outpatient	Tylenol and tramadol prn Tube feedsDysphagia resolved	SLP following continue aspiration precautions	Tube feeds with regular trays 	Patient has been eating more off her trays this week	Nutrition reconsulted to possibly decrease tube feeds as she is taking in more PO 	Scant blood around peg tube site noted on dressing today, no active bleed noted on exam, continue monitoringChronic conditionsBilateral DVT - apixaban 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, elevated FLC ratio felt to be reactive, plasma cell disorder unlikely ?rheumatoid arthirtis - has been off of MTX since November/acute illness, rheumatology has signed off follow up outpatient no plans to restart medication at this time# Hospital Issues- F: none- E: replete PRN- N: Tube feed with regular tray- GI: bowel regimen- PPx: apixaban- Code Status: Full code # Dispo: Pending conservatorship hearing on 5/11, then STR placement Medical decision making for this patient was lowSigned:Eliud Polo [facility] MDHospitalist Medicine Attending5/6/20238:38 AM

## 2022-04-26 NOTE — Plan of Care
Plan of Care Overview/ Patient Status    Pt A+Ox2, RA, lowgrade temps, AOVSS. Pt was tearful overnight but unable to pinpoint why. Pt slept intermittently between care. RLQ ostomy bag changed. No complaints of pain. New  PIV placed to left forearm. Some bleeding noted around G-tube insertion site. Pt received TF last night. Noted to have decreased PO intake. Good UOP.  MD informed of lowgrade temps over the past 24hrs, labs ordered/drawn. Tylenol given this AM with good effect on temp. No complaints/questions, just slightly restless.  Problem: Adult Inpatient Plan of CareGoal: Plan of Care ReviewOutcome: Interventions implemented as appropriateGoal: Patient-Specific Goal (Individualized)Outcome: Interventions implemented as appropriateGoal: Absence of Hospital-Acquired Illness or InjuryOutcome: Interventions implemented as appropriateGoal: Optimal Comfort and WellbeingOutcome: Interventions implemented as appropriateGoal: Readiness for Transition of CareOutcome: Interventions implemented as appropriate Problem: InfectionGoal: Absence of Infection Signs and SymptomsOutcome: Interventions implemented as appropriate Problem: Mobility ImpairmentGoal: Optimal MobilityOutcome: Interventions implemented as appropriate Problem: Aspiration (Enteral Nutrition)Goal: Absence of Aspiration Signs and SymptomsOutcome: Interventions implemented as appropriate

## 2022-04-27 LAB — BASIC METABOLIC PANEL
BKR ANION GAP: 10 (ref 7–17)
BKR BLOOD UREA NITROGEN: 10 mg/dL (ref 8–23)
BKR BUN / CREAT RATIO: 33.3 — ABNORMAL HIGH (ref 8.0–23.0)
BKR CALCIUM: 10.3 mg/dL — ABNORMAL HIGH (ref 8.8–10.2)
BKR CHLORIDE: 103 mmol/L (ref 98–107)
BKR CO2: 24 mmol/L (ref 20–30)
BKR CREATININE: 0.3 mg/dL — ABNORMAL LOW (ref 0.40–1.30)
BKR EGFR, CREATININE (CKD-EPI 2021): >60 mL/min/{1.73_m2} (ref >=60)
BKR GLUCOSE: 99 mg/dL (ref 70–100)
BKR POTASSIUM: 3.9 mmol/L (ref 3.3–5.3)
BKR SODIUM: 137 mmol/L (ref 136–144)

## 2022-04-27 LAB — CBC WITH AUTO DIFFERENTIAL
BKR WAM ABSOLUTE IMMATURE GRANULOCYTES.: 0.02 x 1000/ÂµL (ref 0.00–0.30)
BKR WAM ABSOLUTE LYMPHOCYTE COUNT.: 1.84 x 1000/ÂµL (ref 0.60–3.70)
BKR WAM ABSOLUTE NRBC (2 DEC): 0 x 1000/ÂµL (ref 0.00–1.00)
BKR WAM ANALYZER ANC: 2.98 x 1000/ÂµL (ref 2.00–7.60)
BKR WAM BASOPHIL ABSOLUTE COUNT.: 0.04 x 1000/ÂµL (ref 0.00–1.00)
BKR WAM BASOPHILS: 0.6 % (ref 0.0–1.4)
BKR WAM EOSINOPHIL ABSOLUTE COUNT.: 0.59 x 1000/ÂµL (ref 0.00–1.00)
BKR WAM EOSINOPHILS: 9.1 % — ABNORMAL HIGH (ref 0.0–5.0)
BKR WAM HEMATOCRIT (2 DEC): 32.7 % — ABNORMAL LOW (ref 35.00–45.00)
BKR WAM HEMOGLOBIN: 10.5 g/dL — ABNORMAL LOW (ref 11.7–15.5)
BKR WAM IMMATURE GRANULOCYTES: 0.3 % (ref 0.0–1.0)
BKR WAM LYMPHOCYTES: 28.4 % (ref 17.0–50.0)
BKR WAM MCH (PG): 26.8 pg — ABNORMAL LOW (ref 27.0–33.0)
BKR WAM MCHC: 32.1 g/dL (ref 31.0–36.0)
BKR WAM MCV: 83.4 fL (ref 80.0–100.0)
BKR WAM MONOCYTE ABSOLUTE COUNT.: 1.01 x 1000/ÂµL — ABNORMAL HIGH (ref 0.00–1.00)
BKR WAM MONOCYTES: 15.6 % — ABNORMAL HIGH (ref 4.0–12.0)
BKR WAM MPV: 11 fL (ref 8.0–12.0)
BKR WAM NEUTROPHILS: 46 % (ref 39.0–72.0)
BKR WAM NUCLEATED RED BLOOD CELLS: 0 % (ref 0.0–1.0)
BKR WAM PLATELETS: 290 x1000/ÂµL (ref 150–420)
BKR WAM RDW-CV: 16.2 % — ABNORMAL HIGH (ref 11.0–15.0)
BKR WAM RED BLOOD CELL COUNT.: 3.92 M/ÂµL — ABNORMAL LOW (ref 4.00–6.00)
BKR WAM WHITE BLOOD CELL COUNT: 6.5 x1000/ÂµL (ref 4.0–11.0)

## 2022-04-27 NOTE — Plan of Care
Plan of Care Overview/ Patient Status    1900-0700A&Ox2 to self and place. VSS on RA. C/o L ankle pain, scheduled lidocaine patch and Tylenol  given with fair effect. TF given as ordered,  PEG tube site dressing C/D/I. RLQ  Ileostomy with moderate amount of liquid stool. T&R in bed q2h provied, blue offloading boots in place. Bed alarm on, call bell within reach, frequent rounding done, safety maintained.

## 2022-04-27 NOTE — Plan of Care
Plan of Care Overview/ Patient Status    0700-1900A&Ox2 to self and place, VSS, RA, complained L ankle pain with movement. PEG tube site dressing C/D/I. Tube feeding and regular po diet, poor po intake. RLQ  Ileostomy with moderate amount of liquid stool.  Blue offloading boots in place. Assist x2 to turn in bed,Medications given, call bell within reach, safety maintained.

## 2022-04-27 NOTE — Progress Notes
[facility] HealthMedicine Progress NoteAttending Provider: Marlon Pel, MDSubjective: Reason for Admission: Persistent encephalopathyInterim History: Patient doing ok this morning resting comfortably in bed, no acute complaints. Review of Meds: Scheduled:Current Facility-Administered Medications Medication Dose Route Frequency Provider Last Rate Last Admin ? apixaban (ELIQUIS) tablet 5 mg  5 mg Oral Q12H Pryor Ochoa, MD   5 mg at 04/26/22 2025 ? Banatrol Plus oral powder packet  1 packet Oral BID Pryor Ochoa, MD   1 packet at 04/26/22 2026 ? camphor-menthoL [facility]) lotion   Topical (Top) BID Manzon, Berkley Harvey, MD   Given at 04/26/22 2026 ? famotidine (PEPCID) tablet 20 mg  20 mg Oral BID Pryor Ochoa, MD   20 mg at 04/26/22 2025 ? ferrous gluconate (FERGON) tablet 324 mg  324 mg Oral BID WC Pryor Ochoa, MD   324 mg at 04/26/22 1820 ? folic acid (FOLVITE) tablet 1 mg  1 mg Oral Daily Pryor Ochoa, MD   1 mg at 04/26/22 0856 ? gabapentin (NEURONTIN) capsule 100 mg  100 mg Oral TID Marlon Pel, MD   100 mg at 04/26/22 2026 ? lidocaine (LIDODERM) 5 % 1 patch  1 patch Transdermal Q24H Simeon Craft, MD   1 patch at 04/26/22 2025 ? melatonin tablet 3 mg  3 mg Oral Nightly Pryor Ochoa, MD   3 mg at 04/26/22 2025 ? niacin Immediate Release tablet 100 mg  100 mg Oral Nightly Pryor Ochoa, MD   100 mg at 04/26/22 2026 ? nystatin (MYCOSTATIN) powder 1 Application  1 Application Topical (Top) BID Henriette Combs, MD   1 Application at 04/26/22 2027 ? pyridoxine (vitamin B6) (B-6) tablet 100 mg  100 mg Oral Daily Pryor Ochoa, MD   100 mg at 04/26/22 0856 ? [Held by provider] rosuvastatin (CRESTOR) tablet 10 mg  10 mg Per G Tube Nightly Dorisann Frames, MD   10 mg at 02/14/22 2245 ? sodium chloride 0.9 % flush 3 mL  3 mL IV Push Q8H Dorisann Frames, MD   3 mL at 04/26/22 1409 ? thiamine (VITAMIN B1) tablet 100 mg  100 mg Oral BID Pryor Ochoa, MD   100 mg at 04/26/22 2026 PRN: acetaminophen, dextrose (GLUCOSE) oral liquid 15 g **OR** fruit juice **OR** skim milk, dextrose (GLUCOSE) oral liquid 30 g **OR** fruit juice, dextrose injection, dextrose injection, gabapentin, glucagon, ondansetron **OR** ondansetron (ZOFRAN) IV Push, polyethylene glycol, sodium chloride, traMADoLContinuous Infusions: Objective: Vitals:Temp:  [97.7 ?F (36.5 ?C)-98.3 ?F (36.8 ?C)] 97.7 ?F (36.5 ?C)Pulse:  [79-103] 79Resp:  [16-18] 18BP: (114-131)/(75-83) 114/75SpO2:  [96 %-98 %] 98 %Device (Oxygen Therapy): room airI/O:Gross Totals (Last 24 hours) at 04/27/2022 0815Last data filed at 04/27/2022 0600Intake 297 ml Output 1780 ml Net -1483 ml Physical Exam:	Constitutional:  NAD, well-nourishedHENT: Normocephalic, atraumatic. No scleral icterus. Neck with normal range of motion.CARD: Normal rate, regular rhythm. Normal heart sounds, intact pulses.PULML:  Effort normal, breath sounds normal. GI: Soft, nontender, nondistended. PEG and ostomy in placeEXT: No edemaSKIN: Warm and dry. NEURO: Alert and oriented to self, baseline. LLE foot drop, ble weaknessPSYCH: Mood and affect normal. Labs:Recent Labs Lab 05/06/230431 05/07/230556 WBC 7.1 6.5 HGB 10.6* 10.5* HCT 32.60* 32.70* PLT 316 290  Recent Labs Lab 05/06/230431 05/07/230556 NEUTROPHILS 55.0 46.0  Recent Labs Lab 05/06/230431 05/07/230556 NA 133* 137 K 4.8 3.9 CL 101 103 CO2 22 24 BUN 11 10 CREATININE 0.31* 0.30* GLU 106* 99 ANIONGAP 10 10  Recent Labs Lab 05/06/230431 05/07/230556 CALCIUM 9.5 10.3* MG  1.8  --  PHOS 2.5  --   Recent Labs Lab 05/06/230431 ALT 9* AST 38* ALKPHOS 76 BILITOT 0.3  No results for input(s): PTT, LABPROT, INR in the last 168 hours. FINGERSTICKS: Recent Labs Lab 05/06/230431 05/07/230556 GLU 106* 99 Micro:Lab Results Component Value Date  LABBLOO No Growth after 5 days of incubation 04/05/2022  LABBLOO No Growth after 5 days of incubation 04/05/2022  LABBLOO No Growth after 5 days of incubation 04/02/2022  LABBLOO No Growth after 5 days of incubation 04/02/2022  LABBLOO No Growth after 5 days of incubation 02/12/2022  LABBLOO No Growth after 5 days of incubation 02/12/2022 Diagnostics:Assessment: Ms Sluder is a 66yo female with a history of rheumatoid arthritis on methotrexate and recent prolonged hospitalization for 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 Patient required LTAC placement. Course complicated by intermittent fevers without clear infectious source found, fevers resolved after course of abx. Further complicated by elevated LFTs likely secondary to DILI from IV abx. Now patient is medically ready and pending T19/LTC placement. Plan: Recurrent feverR foot cellulitis resolvedtransient fevers have resolved - last fever 4/15, no leukocytosisBlood cultures 4/12 no growth finalS/p course of CTX and zosyn -> oxacillinPodiatry has signed off - wound healing archived, continue prevalon boots	Continue tylenol prn Hypercalcemia resolvedAcute on chronic, ?secondayr to immobility or primary hyperparathyroidismPTH, PTHrP, vit D, TSH and random cortisol WNLEndocrinology has signed off S/p Zolendronic acid 4mg  x1 on 3/21Acute encephalopathy resolvedLE weaknessUnclear etiology, likely prolonged delirium in setting of prolonged hospitalization back in November and again nowMRI brain and total spine, LP, paraneoplastic and autoimmune encephalopathy panels all negativeNeurology has signed off - recommended EMG to complete workup of LE weakness but patient declined, otherwise no further recommendations 	Overall improved now AAO 1-2 which appears to be new baseline 	Gabapentin 100mg  TID, statin on hold due to LE pain consider restarting when resolved/improved 	PT recommending STR 	T19 and conservatorship hearing scheduled for 5/11Perforated diverticulumS/p R hemicolectomy, end ileostomyLFT elevation - now thought as above to be secondary to DILI from IV abxC/b fluid abscess 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, but repeat imaging was reviewed and found to be improving and so likely resolution of continued debris R lateral and L medial eminence deep tissue injuriesXray with degenerative changes but no acute findingsPodiatry has signed off - WBAT, follow up outpatient	Tylenol and tramadol prn Tube feedsDysphagia resolved	SLP following continue aspiration precautions	Tube feeds with regular trays 	Patient has been eating more off her trays this week	Nutrition reconsulted to possibly decrease tube feeds as she is taking in more PO 	Scant blood around peg tube site noted on dressing today, no active bleed noted on exam, continue monitoringChronic conditionsBilateral DVT - apixaban 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, elevated FLC ratio felt to be reactive, plasma cell disorder unlikely ?rheumatoid arthirtis - has been off of MTX since November/acute illness, rheumatology has signed off follow up outpatient no plans to restart medication at this time# Hospital Issues- F: none- E: replete PRN- N: Tube feed with regular tray- GI: bowel regimen- PPx: apixaban- Code Status: Full code # Dispo: Pending conservatorship hearing on 5/11, then STR placement Medical decision making for this patient was lowSigned:Elif Yonts [facility] MDHospitalist Medicine Attending5/7/20238:38 AM

## 2022-04-28 NOTE — Progress Notes
[facility] Medicine Progress NoteAttending Provider: Pryor Ochoa, MD Subjective                                                                              Subjective: Interim History: Danielle [name] seen this AM. Denied any complaints. No acute events overnight.  Review of Allergies/Meds/Hx: Review of Allergies/Meds/Hx:I have reviewed the Danielle [name]'s: allergies, current scheduled medications, current infusions, current prn medications and past medical history Objective Objective: Vitals:I have reviewed the Danielle [name]'s current vital signs as documented in the Danielle [name]'s EMR.   and Last 24 hours: Temp:  [97.4 ?F (36.3 ?C)-99.1 ?F (37.3 ?C)] 99.1 ?F (37.3 ?C)Pulse:  [78-99] 78Resp:  [16-20] 20BP: (108-124)/(69-80) 108/72SpO2:  [96 %-98 %] 97 %I/O's:I have reviewed the Danielle [name]'s current I&O's as documented in the EMR.Gross Totals (Last 24 hours) at 04/28/2022 1601Last data filed at 04/28/2022 1000Intake 1285 ml Output 1250 ml Net 35 ml Procedures:None Physical Exam Constitutional: oriented to person, and no acute distress.Marland KitchenHEENT: Cardiovascular: RRRS1S2. ?No murmursPulmonary/Chest: BS +. No crackles. No wheezes.Abdominal: Soft. BS +. Non-tender, Non-distended. Ileostomy-no blood. PEG site c/d/i.GU: PurewickMusculoskeletal: Normal ROM. Neurological: alert and oriented to person. Non focal--BLE weakness & slight drop foot.Extremities: BLE + edema. Skin: No rashesLabs:I have reviewed the Danielle [name]'s labs within the last 24 hrs.Last 24 hours: No results found for this or any previous visit (from the past 24 hour(s)).Diagnostics:No new radiology.ECG/Tele Events: No ECG ordered today Assessment Assessment: Assessment:66 y.o. female with PMH of RA, recent prolonged hospitalization for perforated diverticulitis (s/p ex Lap/R hemicolectomy with end ileostomy). Admitted 2/24 for LTAC placement and persistent encephalopathy. Found to be febrile without clear infectious source found, fevers resolved. Extensive work-up of encephalopathy and leg weakness unremarkable. Course c/b elevated LFTs which resolved, likely 2/2 DILI from ceftriaxone or Zosyn. Stable and awaiting T19/LTC placement, but with ongoing significant hypercalcemia.?Mental status appears improved.Principal Problem:  Fever, unspecified fever cause  SNOMED [place](R): FEVER   Plan Plan: # Hx Recurrent Fever/R-foot Cellulitis: improved. -transient fever resolved--last fever 04/05/22, no leukocytosis- PCT decreased 5.95--> 2.08-BCx-04/02/22--NGTD-S/p CTX 2/21 and Zosyn 2/21-2/25-completed ABX-Oxacillin-consulted Podiatry--appreciated recs -Tylenol prn?# Hypercalcemia: improving -Acute on?chronic--resolved-PTH normal (28);?work-up including PTH-rP (9), vitamin D testing, TSH, random cortisol unrevealing-Possibly d/t immobility or primary hyperparathyroidism; -Endocrine consulted -s/p Zolendronic acid 4 mg X1 (3/21)-fasting CTx (Collagen Type-1 Telopeptide)-elevated- Corrected Ca today noted to be 10.7 -> will trial 1 L NS bolus as she has been having poor po intake and hypotensive. ?# Acute encephalopathy: improved LE weakness:-Unclear etiology, possibly prolonged delirium iso hospitalization from Nov 2022 - currentMRI brain, MRI total spine unremarkable-LP unremarkable-Paraneoplastic and autoimmune encephalopathy panels negative-Overall somewhat improved cognitive status during admission, remains alert and oriented x 1-2-Neurology rec'd EMG to complete work-up of LE weakness but pt declined; Neurology does not recommend any further testing and signed off 02/26/22-c/w Gabapentin 100 mg QHS -BLE US-no DVT??# Perforated diverticulum: -s/p R hemicolectomy, end ileostomy/LFT elevation-c/b fluid abscess requiring drainage during Nov 2022 admission in [place]. -?. 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?on this admission?now [facility] scan shows improved Peritoneal nodules?Per 3/30 notes, seems imaging was reviewed and that nodules were improving and thought to be resolution of continued debris.-Work-up showed elevated IgG, smooth muscle antibodies c/f possible autoimmune hepatitisInitial  concern that imaging showed peritoneal nodules ?carcinomatosis-On review of imaging, nodules improving, more likely to be continued resolution of debris from perforation-Liver tests resolved without intervention, so most likely DILI from antibiotics (ceftriaxone and Zosyn)?# R lateral foot and L medial eminence deep tissue injuries:-Stable-Xray feet with degenerative changes but no acute findings-?Podiatry following - no dressing requirements, WBAT to BLE.No?indication for podiatric surgical intervention at this time. ?Danielle [name] may follow-up with Dr. Gazes within 3-4 weeks of discharge.?-?No need for abx at this time --BLE US-no DVT-tramadol 25 mg q12h, prn for pain?# Hx Bilateral DVT:- Apixaban 5mg  BID?# Iron deficiency anemia:-Hematology consult &??signed off - elevated FLC ratio felt to be reactive, plasma cell disorder unlikely ?# Chronic conditions:-Rheumatoid Arthritis - has been off of MTX since November/acute illness, rheumatology signing off current ams/weakness not felt to be related to her RA, follow up outpatient -Bilateral LE DVT - ?provoked from recent surgery, cont lovenox, can resume apixaban at discharge? # Dysphagia:-resolved-s/p  TF + FWF, regular diet with thin liquids -SLP consulted--for re-evluation-aspiration precautions?# GOC and Capacity:- during hospitalization November 2022 with perforated diverticulum, deemed at this time not to have capacity. PEG was ultimately placed and TFs were initiated. She also cleared for regular diet. T19 and conservatorship hearing set for 05/01/22.  Electronically Signed:Jex Strausbaugh Westley Hummer, MD Beeper 04/28/2022,

## 2022-04-28 NOTE — Plan of Care
Plan of Care Overview/ Patient Status    0700-1900Patient is A&Ox2-3, disoriented to situation and occasionally place. C/O of bilateral knee painm PRN tylenol and tramadol administered. Takes pills whole. Bolus tube feeds adinistered per order, PEG with drainage at site, dressing changed. Ileostomy with moderate amount of stool. Incontinent of urine, pure wick cath in place. Wound on R luteal cleft, repositioned off of wound and barrier cream applied. AX2 turn and reposition q2hr, specialty bed and specialty boots in place. Safety checks and hourly rounding performed, see flow sheet and MAR for details.

## 2022-04-28 NOTE — Plan of Care
Plan of Care Overview/ Patient Status    Pt is alert and only oriented to self, able to make her needs known Frequent reorientation provided. VSS and on RA with no respi distress and or CP noted. 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 L side neg for any stool and flatus. LUQ GT CDI- given Jevilty 1.5 1 carton this shift and flushes well. Pt tolerated well- no N/V and or any abd discomfort voiced. Aspiration precaution maintained. Bilat podus boots in place overnight. C/o L ankle pain, lido patch in place. Repositioned in bed frequently. Pericare completed. Poss decreasing TF since pt increasing her PO intake. Pending conservatorship on 05/11 then d/c to STR. Safety maintained and hourly rounding performed. Problem: Adult Inpatient Plan of CareGoal: Plan of Care ReviewOutcome: Interventions implemented as appropriate Problem: Mobility ImpairmentGoal: Optimal MobilityOutcome: Interventions implemented as appropriate Problem: Aspiration (Enteral Nutrition)Goal: Absence of Aspiration Signs and SymptomsOutcome: Interventions implemented as appropriate Problem: Fluid, Electrolyte and Nutrition Imbalance (Ileostomy)Goal: Fluid, Electrolyte and Nutrition BalanceOutcome: Interventions implemented as appropriate

## 2022-04-28 NOTE — Plan of Care
[name]					Location: EP 97/9822-A66 y.o., female				Attending: Pryor Ochoa, MD	Admit Date: 02/11/2022			[id number] LOS: 76 days NUTRITION FOLLOW UP & CALORIE COUNT Dietary Orders (From admission, onward)     Start     Ordered  04/24/22 1446  Diet Tube Feed With Tray  (Diet Tube Feed Panel)  DIET EFFECTIVE NOW      Comments: JEVITY 1.5 @ 1.5 carton bolus @9am  and 1 carton BID (5pm, 9pm) to total 3.5 cartons daily Question Answer Comment Diet Selection? Regular  Safety Tray? (Suicide/Homicide/Police Custody) Safety Tray - No  Adult Tube Feed Formulas: Jevity 1.5 (YNH,SRC)  Feeding Route: G-Tube  Initiate Nutrition Management Protocol (Yes/No?) Yes - Initiate Protocol    04/24/22 1446    No Known AllergiesANTHROPOMETRICS:Height:???70Admit wt:?no recent wt to assess.?Current wt:?69.6 kg (5/1 bed) decreased from 72.7 kg?(4/19?, bed)- will track trends for significance.  BMI:?21.7??(based on?current wt)?Admit wt trends:?04/02/22 0829 74.6 kg Bed 03/31/22 0809 72.7 kg Bed 03/29/22 0825 73.8 kg Bed 03/28/22 0752 73 kg Bed 03/27/22 0739 73.2 kg Bed 03/26/22 0803 71.3 kg Bed 03/25/22 0826 71.5 kg Bed 03/22/22 0804 71 kg Bed 03/20/22 0812 72.6 kg Bed 03/19/22 0824 72.4 kg Bed ?Dosing Wt:?73?kg (based on?current wt trends and normal BMI)Additional wt information:Ht confirmed with pt.?Pt last known wt was 98.6 kg (12/13/2018).?Wt loss?confirmed with admit wt?as pt no longer weighs?98 kg.?Time line for wt loss in not clear and pt cannot report.?1+ trace edema?b/l LE?per flowsheets.?Noted wt loss 3/29-4/1, suspect d/t inadequate oral intake and stopping tube feed. Wt back to adm weight since TF restarted.?Wt:03/20/22 ???72.6 kg????????????Bed03/29/23 ???72.4 kg????????????Bed12/23/19????98.6 kg?ESTIMATED NUTRITION REQUIREMENTS:Kcal/day:?1825??????????????(25 ?kcal/kg)Protein/day:?73-87 ?????????????(1-1.2gm/kg)Fluids/day:?1825 mL/d?(14mL/kcal) ONLY as medical and fluid status allows and only per team discretion. Needs based on:?Dosing Wt (73?kg), maintenance.??NUTRITION ASSESSMENT:?EMR reviewed for follow up assessment. Pt labs and meds reviewed. Calorie count unable to be comprehensively assessed due to lack of documentation, howevere intake continues to be poor. Per nutrient analysis report, from 5/4-5/7, 1188 kcal and 41 g protein were ordered on average. With intake close to 25% per pt report, RN report and flow sheet, actual intake is closer to 297 kcal and 10 g protein per day. PO with TF combined provided ~ 85% of estimated energy and protein needs in the last 3 days. Continue to provide TF to provide 70% of estimated energy needs. No reported interruptions in TF, suspect pt received 100% of goal. Consider resuming 1.5 cartons at 1 pm if intake ceases or becomes less than 25%. Visited pt at bedside- reports no appetite and no sensation of hunger (ongoing). Pt reports liking toast and orange juice for breakfast, chicken salad sandwiches for lunch and meatloaf for dinner. Pt dislikes supplements as they have a foul aftertaste to her. Pt had no nutrition questions at this time. ?Cultural/Religious/Ethnic Needs:?Unknown??Food Allergies: NKFA??NUTRITION DIAGNOSIS:Severe malnutrition r/t depletion aeb moderate muscle and fat depletion.- continues??INTERVENTIONS/RECOMMENDATIONS:?Meals and Snacks:-?Continue Regular?diet- would consider removing additional vitamin and mineral supplementations in setting of receiving full goal rate supportive feeds.?Goal:?po intake?for comfort given support provided by?goal?volume bolus feeds, continue?Nutrition supplement:No need for oral supplements??Enteral Nutrition:?	Continue?JEVITY 1.5?@ 1.5?carton bolus @9am ?and?1 carton?BID (5pm,?9pm)?to total 3.5 cartons daily-	Provides: , 1244 kcal, 53gm protein, free water-	Consider adding back in the 1.5 carton bolus at 1 pm if intake drops below 25%Goal: ?-	Patient will receive >90% of goal volume TF/ TPN prior to next nutrition assessment-?met will continue? ??Discharge Planning and Transfer of Nutrition Care:??Nutrition related discharge needs still being determined at this time, will continue to follow ??MONITORING / EVALUATION:?Food/Nutrition-Related History:Food and Nutrient Intake?Anthropometric Measurements:Weight ?Biochemical Data, Medical Tests and  Procedures:Electrolyte and renal profileGlucose/endocrine profile?Nutrition-Focus Physical FindingsOverall findingsElectronically Signed by Karyl Kinnier, Dietetic Intern May 8, 2023Please note: Nutrition is a consult only service on weekends and holidays.  Please enter a consult in EPIC if assistance is needed on a weekend or holiday or via [facility] [phone] to contact the covering RD.

## 2022-04-28 NOTE — Plan of Care
Inpatient Physical Therapy Progress Note IP Adult PT Eval/Treat - 04/28/22 1207    Date of Visit / Treatment  Date of Visit / Treatment 04/28/22   Note Type Progress Note   Progress Report Due 04/30/22   End Time 1207    General Information  Subjective Can you get me a pair too?   General Observations Pt received supine in bed; on RA; B ileostomies; B podus boots; purwick; RN cleared   Precautions/Limitations Fall Precautions;Bed alarm   Precautions/Limitations Comment Aspiration Precaution    Weight Bearing Status  Weight Bearing Status Comments WBAT BLE    Vital Signs and Orthostatic Vital Signs  Vital Signs Vital Signs Stable   Vital Signs Free text RA; NAD    Pain/Comfort  Pain Comment (Pre/Post Treatment Pain) Pt c/o unquantified L heel pain w/ mobility and touch; RN aware and repositioned for comfort    Patient Coping  Observed Emotional State accepting;cooperative   Verbalized Emotional State acceptance;anxiety    Cognition  Overall Cognitive Status Impaired   Orientation Level Oriented to person;Oriented to place;Oriented to time   Level of Consciousness alert;confused   Following Commands Follows one step commands consistently   Personal Safety / Judgment Fall risk;Requires supervision with mobility   Cognition Comments Pleasant, requires motivation at times    Range of Motion  Range of Motion Comments [facility]    Manual Muscle Testing  Manual Muscle Testing Comments + Deconditioned    Sensory Assessment  Sensory Tests Results No sensory impairment noted    Skin Assessment  Skin Assessment See Nursing Documentation    Balance  Sitting Balance: Static  POOR+    Minimal assist to maintain static position with no Assistive Device   Sitting Balance: Dynamic  POOR+   Moves through 1/2 range with minimal assist to right self   Balance Assist Device Hand held assist   Balance Skills Training Comment Ax1 Bed Mobility  Supine-to-Sit Independence/Assistance Level Maximum assist;Assist of 1   Supine-to-Sit Assist Device Hand held assist;Head of bed elevated;Bed rails   Sit-to-Supine Independence/Assistance Level Maximum assist;Assist of 1   Sit-to-Supine Assist Device Hand held assist;Head of bed elevated   Limitations decreased ability to use legs for bridging/pushing   Bed Mobility, Impairments pain;postural control impaired;impaired balance;strength decreased   Bed Mobility Comments Retropulsive and R lateral lean seated edge of bed, VCs and TCs to correct. Improved LLE range w/ LAQ edge of bed. RLE tremorous w/ LAQ.    Museum/gallery curator Comments Deferred d/t incr need for assist, weakness, and unsteadiness    Handoff Documentation  Handoff Patient in bed;Bed alarm;Patient instructed to call nursing for mobility;Discussed with nursing    Endurance  Endurance Comments Fair    PT- AM-PAC - Basic Mobility Screen- How much help from another person do you currently need.....  Turning from your back to your side while in a a flat bed without using rails? 1 - Total - Requires total assistance or cannot do it at all.   Moving from lying on your back to sitting on the side of a flat bed without using bed rails? 1 - Total - Requires total assistance or cannot do it at all.   Moving to and from a bed to a chair (including a wheelchair)? 1 - Total - Requires total assistance or cannot do it at all.   Standing up from a chair using your arms(e.g., wheelchair or bedside chair)? 1 - Total - Requires total assistance or cannot  do it at all.   To walk in a hospital room? 1 - Total - Requires total assistance or cannot do it at all.   Climbing 3-5 steps with a railing? 1 - Total - Requires total assistance or cannot do it at all.   AMPAC Mobility Score 6   TARGET Highest Level of Mobility Mobility Level 2, Turn self in bed/bed activity/dependent transfer     Therapeutic Exercise  Therapeutic Exercise Comments A/AAROM BLE; AROM BUE    Neuromuscular Re-education  Neuromuscular Re-education Comments Seated postural control training    Therapeutic Functional Activity  Therapeutic Functional Activity Comments Bed mobility; Pt edu    Clinical Impression  Follow up Assessment Pt seen for PT f/u. Performs bed mobility w/ maxAx1. Retropulsive and R lateral lean seated EOB, VCs and TCs to correct. Plan for STS next session. Will benefit from skilled services to improve gross functional ROM/strength, static/dynamic postural control, and to improve activity tolerance. Current PT d/c rec is STR.   Criteria for Skilled Therapeutic Interventions Met yes    Patient/Family Stated Goals  Patient/Family Stated Goal(s) decrease pain;return to prior level of functioning;feel better    Frequency/Equipment Recommendations  PT Frequency 2x per week   What day of week is next treatment expected? Thursday   5/11  PT/PTA completing this assessment RL   Equipment Needs During Admission/Treatment No device    PT Recommendations for Inpatient Admission  Activity/Level of Assist dangle edge of bed;assist of 2   lift oob   Planned Treatment / Interventions  Plan for Next Visit Progress as tol   Training Treatment / Interventions Balance / Investment banker, operational;Endurance Training;Functional Personnel officer / Interventions Patient Education / Training    PT Discharge Summary  Physical Therapy Disposition Recommendation Short Term Rehab     Shanon Payor PT, [place]: [phone]

## 2022-04-29 NOTE — Plan of Care
Problem: Adult Inpatient Plan of CareGoal: Plan of Care ReviewOutcome: Interventions implemented as appropriate Plan of Care Overview/ Patient Status Patient alert and oriented x2  Able to make her needs known. Temp 100.1 @ 12AM Patient had on several blankets, recheck 98.6PO. INC of clear yellow urine. Illiostomy emptied liquid stool, stoma beefy red. Peg tube dressing changed sm amt yellow drainage. Call bell at bedside. Will continue POC.

## 2022-04-29 NOTE — Progress Notes
[facility] Medicine Progress NoteAttending Provider: Pryor Ochoa, MD Subjective                                                                              Subjective: Interim History: Patient seen this AM. Denied any complaints. No acute events overnight.  Review of Allergies/Meds/Hx: Review of Allergies/Meds/Hx:I have reviewed the patient's: allergies, current scheduled medications, current infusions, current prn medications and past medical history Objective Objective: Vitals:I have reviewed the patient's current vital signs as documented in the patient's EMR.   and Last 24 hours: Temp:  [98.2 ?F (36.8 ?C)-100.1 ?F (37.8 ?C)] 98.3 ?F (36.8 ?C)Pulse:  [81-97] 97Resp:  [18-20] 20BP: (113-120)/(72-83) 113/72SpO2:  [97 %-99 %] 99 %I/O's:I have reviewed the patient's current I&O's as documented in the EMR.Gross Totals (Last 24 hours) at 04/29/2022 1613Last data filed at 04/29/2022 0600Intake 1113 ml Output 650 ml Net 463 ml Procedures:None Physical Exam Constitutional: oriented to person, and no acute distress.Marland KitchenHEENT: Cardiovascular: RRRS1S2. ?No murmursPulmonary/Chest: BS +. No crackles. No wheezes.Abdominal: Soft. BS +. Non-tender, Non-distended. Ileostomy-no blood. PEG site c/d/i.GU: PurewickMusculoskeletal: Normal ROM. Neurological: alert and oriented to person. Non focal--BLE weakness & slight drop foot.Extremities: BLE + edema. Skin: No rashesLabs:I have reviewed the patient's labs within the last 24 hrs.Last 24 hours: No results found for this or any previous visit (from the past 24 hour(s)).Diagnostics:No new radiology.ECG/Tele Events: No ECG ordered today Assessment Assessment: Assessment:66 y.o. female with PMH of RA, recent prolonged hospitalization for perforated diverticulitis (s/p ex Lap/R hemicolectomy with end ileostomy). Admitted 2/24 for LTAC placement and persistent encephalopathy. Found to be febrile without clear infectious source found, fevers resolved. Extensive work-up of encephalopathy and leg weakness unremarkable. Course c/b elevated LFTs which resolved, likely 2/2 DILI from ceftriaxone or Zosyn. Stable and awaiting T19/LTC placement, but with ongoing significant hypercalcemia.?Mental status appears improved.Principal Problem:  Fever, unspecified fever cause  SNOMED Dewar(R): FEVER   Plan Plan: # Hx Recurrent Fever/R-foot Cellulitis: improved. -transient fever resolved--last fever 04/05/22, no leukocytosis- PCT decreased 5.95--> 2.08-BCx-04/02/22--NGTD-S/p CTX 2/21 and Zosyn 2/21-2/25-completed ABX-Oxacillin-consulted Podiatry--appreciated recs -Tylenol prn?# Hypercalcemia: improving -Acute on?chronic--resolved-PTH normal (28);?work-up including PTH-rP (9), vitamin D testing, TSH, random cortisol unrevealing-Possibly d/t immobility or primary hyperparathyroidism; -Endocrine consulted -s/p Zolendronic acid 4 mg X1 (3/21)-fasting CTx (Collagen Type-1 Telopeptide)-elevated- Corrected Ca today noted to be 10.7 -> will trial 1 L NS bolus as she has been having poor po intake and hypotensive. ?# Acute encephalopathy: improved LE weakness:-Unclear etiology, possibly prolonged delirium iso hospitalization from Nov 2022 - currentMRI brain, MRI total spine unremarkable-LP unremarkable-Paraneoplastic and autoimmune encephalopathy panels negative-Overall somewhat improved cognitive status during admission, remains alert and oriented x 1-2-Neurology rec'd EMG to complete work-up of LE weakness but pt declined; Neurology does not recommend any further testing and signed off 02/26/22-c/w Gabapentin 100 mg QHS -BLE US-no DVT??# Perforated diverticulum: -s/p R hemicolectomy, end ileostomy/LFT elevation-c/b fluid abscess requiring drainage during Nov 2022 admission in [place]. [place]-?. 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?on this admission?now [place] scan shows improved Peritoneal nodules?Per 3/30 notes, seems imaging was reviewed and that nodules were improving and thought to be resolution of continued debris.-Work-up showed elevated IgG, smooth muscle antibodies c/f possible autoimmune hepatitisInitial  concern that imaging showed peritoneal nodules ?carcinomatosis-On review of imaging, nodules improving, more likely to be continued resolution of debris from perforation-Liver tests resolved without intervention, so most likely DILI from antibiotics (ceftriaxone and Zosyn)?# R lateral foot and L medial eminence deep tissue injuries:-Stable-Xray feet with degenerative changes but no acute findings-?Podiatry following - no dressing requirements, WBAT to BLE.No?indication for podiatric surgical intervention at this time. ?Patient may follow-up with Dr. Gazes within 3-4 weeks of discharge.?-?No need for abx at this time --BLE US-no DVT-tramadol 25 mg q12h, prn for pain?# Hx Bilateral DVT:- Apixaban 5mg  BID?# Iron deficiency anemia:-Hematology consult &??signed off - elevated FLC ratio felt to be reactive, plasma cell disorder unlikely ?# Chronic conditions:-Rheumatoid Arthritis - has been off of MTX since November/acute illness, rheumatology signing off current ams/weakness not felt to be related to her RA, follow up outpatient -Bilateral LE DVT - ?provoked from recent surgery, cont lovenox, can resume apixaban at discharge? # Dysphagia:-resolved-s/p  TF + FWF, regular diet with thin liquids -SLP consulted--for re-evluation-aspiration precautions?# GOC and Capacity:- during hospitalization November 2022 with perforated diverticulum, deemed at this time not to have capacity. PEG was ultimately placed and TFs were initiated. She also cleared for regular diet. T19 and conservatorship hearing set for 05/01/22.  Electronically Signed:Jinan Biggins Westley Hummer, MD Beeper 04/29/2022,

## 2022-04-30 NOTE — Plan of Care
Brief Nutrition Note- Calorie Count   Diet Order: Bolus TF Jevity 1.5 (3.5 Cartons/d) with Regular Tray ESTIMATED NUTRITION REQUIREMENTS:Kcal/day:?1825??????????????(25 ?kcal/kg)Protein/day:?73-87 ?????????????(1-1.2gm/kg)Fluids/day:?1825 mL/d?(21mL/kcal) ONLY as medical and fluid status allows and only per team discretion. Needs based on:?Dosing Wt (73?kg), maintenance.Calorie Count5/8: 528 kcal, 15.5 g pro (3 meals documented, 3 ordered)5/9: 959 kcal, 37 g pro (2 meals documented, 3 ordered) *dinner recalled from pt* Average: 744 kcal, 26 gm proAssessment: The dinner that wasn't documented on 5/9 was the most energy and protein dense meal pt has ordered ( 859 kcal, 30 gm protein)- pt reported to writer 50% was consumed. Pt is on TF which provides 1244 kcal and 53 gm protein. Combined with PO intake pt is getting on average 1988 kcal and 79 gm protein; which meets the energy needs for this pt. Appears pt has slowly been increasing intake. She reports she likes being able to take food PO and is better able to with reduced TF volume/load. Pt reports previously feeling too full and uncomfortable with larger TF volume, compared to current regimen. Pt disoriented but reports she was eating normally by mouth before being admitted and that she received the PEG at [facility] Recommendation: Continue Regular Diet	- Encourage meal ordering and to provide assistanceContinue bolus TF regimen 	- Jevity 1.5 with 1.5 cartons @ 9am and 1 carton BID (5pm and 9pm) Total 3.5 cartons 	- Provides 829 ml, 1244 kcal, 53 gm protein, 630 mL free waterElectronically Signed by Marlowe Kays Intern Apr 30, 2022

## 2022-04-30 NOTE — Progress Notes
[facility] Medicine Progress NoteAttending Provider: Pryor Ochoa, MD Subjective                                                                              Subjective: Interim History: Patient seen this AM. Denied any complaints. No acute events overnight.  Review of Allergies/Meds/Hx: Review of Allergies/Meds/Hx:I have reviewed the patient's: allergies, current scheduled medications, current infusions, current prn medications and past medical history Objective Objective: Vitals:I have reviewed the patient's current vital signs as documented in the patient's EMR.   and Last 24 hours: Temp:  [97.6 ?F (36.4 ?C)-98.3 ?F (36.8 ?C)] 97.6 ?F (36.4 ?C)Pulse:  [94-97] 94Resp:  [18-20] 18BP: (107-142)/(72-84) 142/84SpO2:  [97 %-99 %] 98 %I/O's:I have reviewed the patient's current I&O's as documented in the EMR.Gross Totals (Last 24 hours) at 04/30/2022 1103Last data filed at 04/30/2022 0550Intake 477 ml Output 1250 ml Net -773 ml Procedures:None Physical Exam Constitutional: oriented to person, and no acute distress.Marland KitchenHEENT: Cardiovascular: RRRS1S2. ?No murmursPulmonary/Chest: BS +. No crackles. No wheezes.Abdominal: Soft. BS +. Non-tender, Non-distended. Ileostomy-no blood. PEG site c/d/i.GU: PurewickMusculoskeletal: Normal ROM. Neurological: alert and oriented to person. Non focal--BLE weakness & slight drop foot.Extremities: BLE + edema. Skin: No rashesLabs:I have reviewed the patient's labs within the last 24 hrs.Last 24 hours: No results found for this or any previous visit (from the past 24 hour(s)).Diagnostics:No new radiology.ECG/Tele Events: No ECG ordered today Assessment Assessment: Assessment:66 y.o. female with PMH of RA, recent prolonged hospitalization for perforated diverticulitis (s/p ex Lap/R hemicolectomy with end ileostomy). Admitted 2/24 for LTAC placement and persistent encephalopathy. Found to be febrile without clear infectious source found, fevers resolved. Extensive work-up of encephalopathy and leg weakness unremarkable. Course c/b elevated LFTs which resolved, likely 2/2 DILI from ceftriaxone or Zosyn. Stable and awaiting T19/LTC placement, but with ongoing significant hypercalcemia.?Mental status appears improved.Principal Problem:  Fever, unspecified fever cause  SNOMED Byron(R): FEVER   Plan Plan: # Hx Recurrent Fever/R-foot Cellulitis: improved. -transient fever resolved--last fever 04/05/22, no leukocytosis- PCT decreased 5.95--> 2.08-BCx-04/02/22--NGTD-S/p CTX 2/21 and Zosyn 2/21-2/25-completed ABX-Oxacillin-consulted Podiatry--appreciated recs -Tylenol prn?# Hypercalcemia: improving -Acute on?chronic--resolved-PTH normal (28);?work-up including PTH-rP (9), vitamin D testing, TSH, random cortisol unrevealing-Possibly d/t immobility or primary hyperparathyroidism; -Endocrine consulted -s/p Zolendronic acid 4 mg X1 (3/21)-fasting CTx (Collagen Type-1 Telopeptide)-elevated- Corrected Ca today noted to be 10.7 -> will trial 1 L NS bolus as she has been having poor po intake and hypotensive. ?# Acute encephalopathy: improved LE weakness:-Unclear etiology, possibly prolonged delirium iso hospitalization from Nov 2022 - currentMRI brain, MRI total spine unremarkable-LP unremarkable-Paraneoplastic and autoimmune encephalopathy panels negative-Overall somewhat improved cognitive status during admission, remains alert and oriented x 1-2-Neurology rec'd EMG to complete work-up of LE weakness but pt declined; Neurology does not recommend any further testing and signed off 02/26/22-c/w Gabapentin 100 mg QHS -BLE US-no DVT??# Perforated diverticulum: -s/p R hemicolectomy, end ileostomy/LFT elevation-c/b fluid abscess requiring drainage during Nov 2022 admission in [place]. [place]-?. 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?on this admission?now [place] scan shows improved Peritoneal nodules?Per 3/30 notes, seems imaging was reviewed and that nodules were improving and thought to be resolution of continued debris.-Work-up showed elevated IgG, smooth muscle antibodies c/f possible autoimmune hepatitisInitial  concern that imaging showed peritoneal nodules ?carcinomatosis-On review of imaging, nodules improving, more likely to be continued resolution of debris from perforation-Liver tests resolved without intervention, so most likely DILI from antibiotics (ceftriaxone and Zosyn)?# R lateral foot and L medial eminence deep tissue injuries:-Stable-Xray feet with degenerative changes but no acute findings-?Podiatry following - no dressing requirements, WBAT to BLE.No?indication for podiatric surgical intervention at this time. ?Patient may follow-up with Dr. Gazes within 3-4 weeks of discharge.?-?No need for abx at this time --BLE US-no DVT-tramadol 25 mg q12h, prn for pain?# Hx Bilateral DVT:- Apixaban 5mg  BID?# Iron deficiency anemia:-Hematology consult &??signed off - elevated FLC ratio felt to be reactive, plasma cell disorder unlikely ?# Chronic conditions:-Rheumatoid Arthritis - has been off of MTX since November/acute illness, rheumatology signing off current ams/weakness not felt to be related to her RA, follow up outpatient -Bilateral LE DVT - ?provoked from recent surgery, cont lovenox, can resume apixaban at discharge? # Dysphagia:-resolved-s/p  TF + FWF, regular diet with thin liquids -SLP consulted--for re-evluation-aspiration precautions?# GOC and Capacity:- during hospitalization November 2022 with perforated diverticulum, deemed at this time not to have capacity. PEG was ultimately placed and TFs were initiated. She also cleared for regular diet. T19 and conservatorship hearing set for 05/01/22. Electronically Signed:Varnika Butz Westley Hummer, MD Beeper 04/30/2022,

## 2022-04-30 NOTE — Plan of Care
Plan of Care Overview/ Patient Status    1900-0700 Alert and oriented x2-3. VSS on RA. Denies pain. Bolus feed given as ordered via PEG. Pt also on regular diet. Calorie count continued. Right ileostomy intact. Left mucus fistula ostomy pouch intact. Purewick in place. Incontinence care provided as needed. Turned and repositioned q2h. Awaiting conservator hearing on May 11. Safety maintained. Hourly rounding completed. See flowsheets for assessments.Problem: Adult Inpatient Plan of CareGoal: Plan of Care ReviewOutcome: Interventions implemented as appropriateFlowsheets (Taken 04/29/2022 2359)Progress: no changePlan of Care Reviewed With: patient

## 2022-04-30 NOTE — Plan of Care
Plan of Care Overview/ Patient Status    Patient was discussed in rounds, patient remains medically cleared. Per SW Abran Duke, Conservatorship hearing scheduled for tomorrow at 11:15 AM. Plan is SNF LTC once medically cleared. CM will continue to follow and assist. Melina Copa, MSW, LCSWCare [phone]

## 2022-05-01 ENCOUNTER — Inpatient Hospital Stay: Admit: 2022-05-01 | Payer: PRIVATE HEALTH INSURANCE

## 2022-05-01 MED ORDER — TRAMADOL 50 MG TABLET
50 mg | Freq: Two times a day (BID) | ORAL | Status: DC | PRN
Start: 2022-05-01 — End: 2022-05-15
  Administered 2022-05-01 – 2022-05-14 (×16): 50 mg via ORAL

## 2022-05-01 NOTE — Plan of Care
Plan of Care Overview/ Patient Status    Pt A+Ox2, medicated per [facility] for BLE pain with little to know effect,MD aware and was at bedside this am. Cont on bolus tube feed. Ax2 with T&R on my shift. Purewick in place. On bedrest. Safety and comfort maintained. WCTM.

## 2022-05-01 NOTE — Progress Notes
[facility]	 [facility]	Hospitalist Service Progress NoteAttending Provider: Irven Easterly, MDConsultants: N/ASubjective: Chief complaint: Fever, unspecified fever causeInterim History:  Patient seen resting comfortably, no acute events overnight.  Denies any new complaints this morning except for continued right foot pain which improves sometimes with medications.ROS: All other systems reviewed and are otherwise negative.Review of Allergies/Meds/Hx: I have reviewed the patient's current medications.Current Facility-Administered Medications Medication Dose Route Frequency Last Rate ? acetaminophen  975 mg Oral Q6H PRN   ? apixaban  5 mg Oral Q12H   ? banana flakes-t-galactooligos.  1 packet Oral BID   ? camphor-menthoL   Topical (Top) BID   ? dextrose (GLUCOSE) oral liquid 15 g  15 g Oral Q15 MIN PRN    Or ? fruit juice  120 mL Oral Q15 MIN PRN    Or ? skim milk  240 mL Oral Q15 MIN PRN   ? dextrose (GLUCOSE) oral liquid 30 g  30 g Oral Q15 MIN PRN    Or ? fruit juice  240 mL Oral Q15 MIN PRN   ? dextrose injection  12.5 g Intravenous Q15 MIN PRN   ? dextrose injection  25 g Intravenous Q15 MIN PRN   ? famotidine  20 mg Oral BID   ? ferrous gluconate  324 mg Oral BID WC   ? folic acid  1 mg Oral Daily   ? gabapentin  100 mg Oral BID PRN   ? gabapentin  100 mg Oral TID   ? glucagon  1 mg Intramuscular Once PRN   ? Lidocaine Patch  1 patch Transdermal Q24H   ? melatonin  3 mg Oral Nightly   ? niacin  100 mg Oral Nightly   ? nystatin  1 Application Topical (Top) BID   ? ondansetron  4 mg Oral Q6H PRN    Or ? ondansetron (ZOFRAN) IV Push  4 mg IV Push Q6H PRN   ? polyethylene glycol  17 g Oral DAILY PRN   ? pyridoxine (vitamin B6)  100 mg Oral Daily   ? [Held by provider] rosuvastatin  10 mg Per G Tube Nightly   ? sodium chloride  3 mL IV Push Q8H   ? sodium chloride  3 mL IV Push PRN for Line Care   ? thiamine  100 mg Oral BID   ? traMADoL  25 mg Oral Q12H PRN   Objective: Vitals:Last 24 hours: Temp:  [97.5 ?F (36.4 ?C)-99.2 ?F (37.3 ?C)] 97.5 ?F (36.4 ?C)Pulse:  [84-97] 93Resp:  [18] 18BP: (100-136)/(65-87) 136/87SpO2:  [97 %-100 %] 99 %I/O's:Gross Totals (Last 24 hours) at 05/01/2022 1625Last data filed at 05/01/2022 0815Intake 357 ml Output 975 ml Net -618 ml Physical Exam: General: lying comfortably, no acute distressHEENT:  mucous membranes moistNeck: trachea midline, neck suppleCV: regular rate and rhythm, normal S1, S2Pulm: lungs clear to auscultation bilaterallyGI/GU: soft, nontender, nondistended, normoactive bowel sounds presentMusculoskeletal: no clubbing or cyanosis. Skin: warm and dry, no rashes or ulcerations.Neuro: alert and oriented x 2, moves all extremities equallyLabs: Last 24 hours: No results found for this or any previous visit (from the past 24 hour(s)).Micro:No results found for this or any previous visit (from the past 168 hour(s)).Diagnostics:Reviewed Assessment: 66 y.o.?female?with PMH of RA, recent prolonged hospitalization for perforated diverticulitis (s/p ex Lap/R hemicolectomy with end ileostomy). Admitted 2/24 for LTAC placement and persistent encephalopathy. Found to be febrile without clear infectious source found, fevers resolved. Extensive work-up of encephalopathy and leg weakness unremarkable. Course c/b elevated  LFTs which resolved, likely 2/2 DILI from ceftriaxone or Zosyn. Stable and awaiting T19/LTC placement, but with ongoing significant hypercalcemia.?Mental status appears improved.Plan: # Hx Recurrent Fever/R-foot Cellulitis: improved. -transient fever resolved--last fever 04/05/22, no leukocytosis- PCT decreased 5.95--> 2.08-BCx-04/02/22--NGTD-S/p CTX 2/21 and Zosyn 2/21-2/25-completed ABX-Oxacillin-consulted Podiatry--appreciated recs -Tylenol prn?# Hypercalcemia: improving -Acute on?chronic--resolved-PTH normal (28);?work-up including PTH-rP (9), vitamin D testing, TSH, random cortisol unrevealing-Possibly d/t immobility or primary hyperparathyroidism; -Endocrine consulted -s/p Zolendronic acid 4 mg X1 (3/21)-fasting CTx (Collagen Type-1 Telopeptide)-elevated- Corrected Ca today noted to be 10.7 -> will trial 1 L NS bolus as she has been having poor po intake and hypotensive. ?# Acute encephalopathy: improved LE weakness:-Unclear etiology, possibly prolonged delirium iso hospitalization from Nov 2022 - currentMRI brain, MRI total spine unremarkable-LP unremarkable-Paraneoplastic and autoimmune encephalopathy panels negative-Overall somewhat improved cognitive status during admission, remains alert and oriented x 1-2-Neurology rec'd EMG to complete work-up of LE weakness but pt declined; Neurology does not recommend any further testing and signed off 02/26/22-c/w Gabapentin 100 mg QHS -BLE US-no DVT??# Perforated diverticulum:?-s/p R hemicolectomy, end ileostomy/LFT elevation-c/b fluid abscess requiring drainage during Nov 2022 admission in [place]. [place]-?. 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?on this admission?now [place] scan shows improved Peritoneal nodules?Per 3/30 notes, seems imaging was reviewed and that nodules were improving and thought to be resolution of continued debris.-Work-up showed elevated IgG, smooth muscle antibodies c/f possible autoimmune hepatitisInitial concern that imaging showed peritoneal nodules ?carcinomatosis-On review of imaging, nodules improving, more likely to be continued resolution of debris from perforation-Liver tests resolved without intervention, so most likely DILI from antibiotics (ceftriaxone and Zosyn)?# R lateral foot and L medial eminence deep tissue injuries:-Stable-Xray feet with degenerative changes but no acute findings-?Podiatry following - no dressing requirements, WBAT to BLE.No?indication for podiatric surgical intervention at this time. ?Patient may follow-up with Dr. Gazes within 3-4 weeks of discharge.?-?No need for abx at this time --BLE US-no DVT-tramadol 25 mg q12h, prn for pain?# Hx Bilateral DVT:- Apixaban 5mg  BID?# Iron deficiency anemia:-Hematology consult &??signed off - elevated FLC ratio felt to be reactive, plasma cell disorder unlikely ?# Chronic conditions:-Rheumatoid Arthritis - has been off of MTX since November/acute illness, rheumatology signing off current ams/weakness not felt to be related to her RA, follow up outpatient -Bilateral LE DVT - ?provoked from recent surgery, cont lovenox, can resume apixaban at discharge? # Dysphagia:-resolved-s/p ?TF + FWF, regular diet with thin liquids -SLP consulted--for re-evluation-aspiration precautions?# GOC and Capacity:- during hospitalization November 2022 with perforated diverticulum, deemed at this time not to have capacity. PEG was ultimately placed and TFs were initiated. She also cleared for regular diet. T19 and conservatorship hearing set for 05/01/22. ??Signed:Marveen Reeks, MDHospitalist Attending PhysicianYale Ascension St [facility] [place], [place] [phone] office5/11/20234:25 PM

## 2022-05-01 NOTE — Plan of Care
Plan of Care Overview/ Patient Status    1900-0700 Alert and oriented x2-3. VSS on RA. C/o BLE pain this AM. PRN Tramadol given, pending effect. Bolus feed given as ordered via PEG. Pt also on regular diet. Calorie count continued. Right ileostomy intact. Left mucus fistula ostomy pouch intact and changed. Purewick in place. Incontinence care provided as needed. Turned and repositioned q2h. Awaiting conservator hearing on May 11. Safety maintained. Hourly rounding completed. See flowsheets for assessments.Problem: Adult Inpatient Plan of CareGoal: Plan of Care ReviewOutcome: Interventions implemented as appropriateFlowsheets (Taken 04/30/2022 2327)Progress: no changePlan of Care Reviewed With: patient

## 2022-05-01 NOTE — Plan of Care
Plan of Care Overview/ Patient Status    Medical Social Work Follow Up  [employer] Most Recent Value Admission Information  Document Type Progress Note Reason for Inability to Assess --  [Patient mental status is altered] (For Inpatient/ED Only) Prior psychosocial assessment has been documented within this hospitalization No (For Inpatient/ED Only) Prior psychosocial assessment has been documented within 30 days of this hospitalization No Reason for Current Social Work Involvement Acupuncturist Medical Team Comment Medical Team, Complex Care Management, Care Management Community Agency Comment [place] [facility] [facility] Record Reviewed Yes Level of Care Inpatient Psychosocial issues requiring intervention Conservatorship Psychosocial interventions 10 minutes spent on the phone with Danielle [name] (sister) [phone]. Danielle [name] informed that the court hearing went well and that she was appointed as the the conservator. She stated that she is waiting for the judge to send her the paperwork so that she can continue with the process for Title 19. Danielle [name] informed that Department of Social [employer] Medicaid has given her another extension to give her time to get the paperwork needed to complete the application. I provided support and positive feedback for her efforts and follow-through. I informed her that as soon as she receives the decree to provide the hospital with a copy for the record and Danielle [name] agreed. I encouraged Danielle [name] to reach out if any needs should arise. Danielle [name] appreciated my support and I thanked her for her time. Collaborations [employer] Specific referrals to enhance community supports (include existing and new resources) None Handoff Required? No Next Steps/Plan (including hand-off): I will remain involved for support as needed. Signature: Leim Fabry, LCSW Contact Information: [phone]

## 2022-05-02 NOTE — Plan of Care
Plan of Care Overview/ Patient Status    Patient was discussed in rounds, patient remains medically cleared. Per SW Abran Duke, patient's sister [name] has been appointed Acupuncturist and awaiting court decree. Plan is SNF LTC once medically cleared. CM will continue to follow and assist. Melina Copa, MSW, LCSWCare [phone]

## 2022-05-02 NOTE — Plan of Care
Plan of Care Overview/ Patient Status    Problem: Adult Inpatient Plan of CareGoal: Plan of Care ReviewOutcome: Interventions implemented as appropriateFlowsheets (Taken 05/02/2022 1114)Progress: no changePlan of Care Reviewed With: patientOutcome Evaluation: Safety maintained, Turned and repoisitioned in bed, purewick in place, no residuals, tolerated TF bolus  this morning. Repositioned b/l LE, pain management. Patient Aox4, with moments of forgetfulness. C/O B/L LE pain, received Tylenol  this morning states that it has helped with the pain. Tolerated bolus feed of Jevity 1.5No outputs at the time in both ostomies. Incontinence pads changed, purewick catheter changedBed alarm on, safety maintained.

## 2022-05-02 NOTE — Plan of Care
Inpatient Physical Therapy Re-Evaluation IP Adult PT Eval/Treat - 05/02/22 0902    Date of Visit / Treatment  Date of Visit / Treatment 05/02/22   Note Type Re-Evaluation   Progress Report Due 06/02/22   End Time 0902    General Information  Pertinent History Of Current Problem Per chart: 66 y.o. female with PMH of RA, recent prolonged hospitalization for perforated diverticulitis (s/p ex Lap/R hemicolectomy with end ileostomy). Admitted 2/24 for LTAC placement and persistent encephalopathy. Found to be febrile without clear infectious source found, fevers resolved. Extensive work-up of encephalopathy and leg weakness unremarkable. Course c/b elevated LFTs which resolved, likely 2/2 DILI from ceftriaxone or Zosyn. Stable and awaiting T19/LTC placement, but with ongoing significant hypercalcemia. Mental status appears improved.   Other Providers OT d/t case complexity and incr need for assist   General Observations Pt received supine in bed; on RA; NAD; B ileostomies; purwick; RN cleared   Precautions/Limitations Fall Precautions;Bed alarm   Precautions/Limitations Comment Aspiration Precaution    Weight Bearing Status  Weight Bearing Status Comments WBAT BLE    Prior Level of Functioning/Social History  Additional Comments Pt was brought to [facility] by her family from [facility]. Her sister, [name] has been appointed Acupuncturist (Hearing 05/01/22) and awaiting court decree. Plan is SNF LTC once medically cleared.  PLOF is unclear as she has had a prolonged hospital course.    Vital Signs and Orthostatic Vital Signs  Vital Signs Vital Signs Stable   Vital Signs Free text RA; NAD    Pain/Comfort  Pain Comment (Pre/Post Treatment Pain) No c/o pain pre session. C/o pain/facial grimace w/ touch and mobilization to BLE. RN aware and repositioned for comfort    Patient Coping  Observed Emotional State accepting;cooperative   Verbalized Emotional State acceptance Cognition  Overall Cognitive Status WFL   Orientation Level Oriented to person;Oriented to place;Oriented to time   Level of Consciousness alert;confused   Following Commands Follows one step commands with increased time;Follows one step commands with repetition   Personal Safety / Judgment Fall risk;Requires supervision with mobility   Attention Requires redirection;Requires cues   Cognition Comments Continues to show improvements in cognition    Vision/ Hearing  Vision Assessment Results No vision deficits noted    Range of Motion  Range of Motion Examination WFL except   Range of Motion Comments Limited B shld AROM ~ 90 deg; Tight B HM and heel cords    Manual Muscle Testing  Manual Muscle Testing Comments + Deconditoned    Musculoskeletal  LUE Muscle Strength Grading 2-->active movement with gravity eliminated   RUE Muscle Strength Grading 2-->active movement with gravity eliminated   LLE Muscle Strength Grading 2-->active movement with gravity eliminated   RLE Muscle Strength Grading 2-->active movement with gravity eliminated    Muscle Tone  Muscle Tone Testing Results No muscle tone deficits noted    Coordination  Opposition WFL - Within Functional Limits   Fine Motor Coordination WFL   Coordination Comments WFL    Sensory Assessment  Sensory Tests Results WFL except   Sensory Assessment Comments highly sensitive to touch to BLE (L>R)    Skin Assessment  Skin Assessment See Nursing Documentation    Posture, Head/Trunk Alignment  Posture, Head/Trunk Alignment Retropulsive;Left lateral lean    Balance  Sitting Balance: Static  POOR+    Minimal assist to maintain static position with no Assistive Device   Sitting Balance: Dynamic  POOR     Moves through 1/4 to 1/2  ROM range with moderate assist to right self   Standing Balance: Static POOR-     Maximal assist to maintain static position with no Assistive Device Standing Balance: Dynamic  POOR-    Unable to move from midline voluntarily, maximal assist to right self   Balance Assist Device Hand held assist   Balance Skills Training Comment Ax2-3    Bed Mobility  Supine-to-Sit Independence/Assistance Level Maximum assist;Assist of 2   Supine-to-Sit Assist Device Hand held assist;Head of bed elevated;Bed rails   Sit-to-Supine Independence/Assistance Level Maximum assist;Assist of 2   Sit-to-Supine Assist Device Hand held assist   Bed Mobility, Impairments ROM decreased;strength decreased;postural control impaired;pain   Bed mobility was limited by: impaired motor function;pain    Sit-Stand Transfer Training  Sit-to-Stand Transfer Independence/Assistance Level Maximum assist;Assist of 2   Sit-to-Stand Transfer Assist Device Hand held assist   Stand-to-Sit Transfer Independence/Assistance Level Maximum assist;Assist of 2   Stand-to-Sit Transfer Assist Device Hand held assist   Transfer Safety Analysis Concerns losing balance backward;decreased weight-shifting ability;cues for hand placement   Transfer Safety Analysis Impairments impaired balance;pain;decreased strength;impaired sensory feedback   Sit-Stand Transfer Comments VCs for hand placement and upright, unable to achieve    Gait Training  Gait Training Comments NT d/t unsteadiness, pain, and weakness    Handoff Documentation  Handoff Patient in bed;Bed alarm;Patient instructed to call nursing for mobility;Discussed with nursing    Endurance  Endurance Comments Fair    PT- AM-PAC - Basic Mobility Screen- How much help from another person do you currently need.....  Turning from your back to your side while in a a flat bed without using rails? 1 - Total - Requires total assistance or cannot do it at all.   Moving from lying on your back to sitting on the side of a flat bed without using bed rails? 1 - Total - Requires total assistance or cannot do it at all. Moving to and from a bed to a chair (including a wheelchair)? 1 - Total - Requires total assistance or cannot do it at all.   Standing up from a chair using your arms(e.g., wheelchair or bedside chair)? 1 - Total - Requires total assistance or cannot do it at all.   To walk in a hospital room? 1 - Total - Requires total assistance or cannot do it at all.   Climbing 3-5 steps with a railing? 1 - Total - Requires total assistance or cannot do it at all.   AMPAC Mobility Score 6   TARGET Highest Level of Mobility Mobility Level 2, Turn self in bed/bed activity/dependent transfer    ACTUAL Highest Level of Mobility Mobility Level 3, Sit on edge of bed    Therapeutic Exercise  Therapeutic Exercise Comments A/AROM BLE; AROM BUE    Neuromuscular Re-education  Neuromuscular Re-education Comments Seated postural control training - LAQ, crossbody reach    Therapeutic Functional Activity  Therapeutic Functional Activity Comments Bed mobility; transfer training; Pt edu    Clinical Impression  Follow up Assessment RE-EVAL (05/02/22:) Pt seen for PT Re-eval. Primary impairments are impaired sensory feedback, decreased functional ROM/strength, impaired postural control, and poor activity tolerance. Requires maxAx2 for supine<>sit and STS transfers. Retropulsive w/ L left lean seated EOB requiring assist to prevent LOB. Highly sensitive to touch and mobilization to BLE. Will benefit from skilled services to address aforementioned impairments. Current PT d/c rec is STR.   Criteria for Skilled Therapeutic Interventions Met yes    Patient/Family Stated Goals  Patient/Family Stated Goal(s) return to prior level of functioning;feel better;decrease pain    Frequency/Equipment Recommendations  PT Frequency 2x per week   What day of week is next treatment expected? Monday   5/15  PT/PTA completing this assessment RL   Equipment Needs During Admission/Treatment No device    PT Recommendations for Inpatient Admission  Activity/Level of Assist dangle edge of bed;assist of 2    Planned Treatment / Interventions  Plan for Next Visit Progress as tol   Training Treatment / Interventions Balance / Investment banker, operational;Endurance Training;Functional Personnel officer / Interventions Patient Education / Training    PT Discharge Summary  Physical Therapy Disposition Recommendation Short Term Rehab     Problem: Physical Therapy GoalsGoal: Physical Therapy GoalsDescription: PT GOALS - addressed 05/02/22.1. Patient will demonstrate active range of motion x 4 extremities in preparation for mobility (GOAL MET)2. Caregivers will be independent and safely implement a range of motion/positioning program to prevent loss of range of motion and skin integrity (ONGOING)3. Patient will perform bed mobility with maximal assist (GOAL MET)4. Patient will tolerate sitting edge of bed >5 minutes with minimal assist (GOAL MET)5. Patient will perform transfers with moderate assist of 2 using rolling walker (ONGOING)6. Pt will perform bed mobility w/ modAx1 (ONGOING)7. Pt will perform STS transfer w/ modAx2 for a standing tolerance of 1 minute. (NEW) Outcome: Revised Shanon Payor PT, DPTMHB: [phone]

## 2022-05-02 NOTE — Progress Notes
Inpatient Occupational Therapy Progress NoteDefault Flowsheet Data (most recent)   IP Adult OT Eval/Treat - 05/02/22 0801    Date of Visit / Treatment  Date of Visit / Treatment 05/02/22   Note Type Progress Note   Progress Report Due 05/08/22   End Time 0801    General Information  Pertinent History Of Current Problem Per chart: 66 y.o. 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  Was deemed to lack capaciity, however required ethics consult as family difficult to contact for medical decision making, s/p PEG placement at that time.  Course further c/b b/l DVT, started on AC.  Now presented to [facility] for placement as family unable to care for her, found to be febrile/tachycardic   Precautions/Limitations Fall Precautions    Vital Signs and Orthostatic Vital Signs  Vital Signs Vital Signs Stable    Pain/Comfort  Location #1 - PreTreatment Rating (Numbers Scale) 0/10 - no pain   Posttreatment Rating (Numbers Scale) 0/10 - no pain    Patient Coping  Observed Emotional State accepting   Verbalized Emotional State acceptance    Cognition  Orientation Level Oriented to person;Oriented to place;Disoriented to time;Disoriented to situation   Level of Consciousness alert;confused   Following Commands Follows one step commands with repetition   Administrator, arts / Judgment Fall risk   Short Term Memory Requires cues   Long Term Memory Requires cues   Attention Sustained impairment   Problem Solving Simple impairment    Range of Motion  Range of Motion Examination WFL except    Musculoskeletal  LUE Muscle Strength Grading 2-->active movement with gravity eliminated   RUE Muscle Strength Grading 2-->active movement with gravity eliminated   LLE Muscle Strength Grading 2-->active movement with gravity eliminated   RLE Muscle Strength Grading 2-->active movement with gravity eliminated    Muscle Tone  Muscle Tone Testing Results No muscle tone deficits noted    Sensory Assessment  Sensory Tests Results No sensory impairment noted    Skin Assessment  Skin Assessment See Nursing Documentation    Balance  Sitting Balance: Static  POOR+    Minimal assist to maintain static position with no Assistive Device   Sitting Balance: Dynamic  POOR+   Moves through 1/2 range with minimal assist to right self   Standing Balance: Static POOR-     Maximal assist to maintain static position with no Assistive Device   Standing Balance: Dynamic  POOR-    Unable to move from midline voluntarily, maximal assist to right self     AM-PAC - Daily Activity IP Short Form  Help needed from another person putting on/taking off regular lower body clothing 2 - A Lot   Help needed from another person for bathing (incl. washing, rinsing, drying) 2 - A Lot   Help needed from another person for toileting (incl. using toilet, bedpan, urinal) 2 - A Lot   Help needed from another person putting on/taking off regular upper body clothing 2 - A Lot   Help needed from another person taking care of personal grooming such as brushing teeth 2 - A Lot   Help needed from another person eating meals 2 - A Lot   AM-PAC Daily Activity Raw Score (Total of rows above) 12   CMS Score (based on Raw Score - with G Code) 12 -  66.57% impaired      (G Code - CL)    Bed Mobility  Rolling/Turning Left - Independence/Assistance Level Moderate assist   Supine-to-Sit Independence/Assistance Level Maximum assist   Supine-to-Sit Assist Device Hand held assist   Sit-to-Supine Independence/Assistance Level Maximum assist   Sit-to-Supine Assist Device Hand held assist    Sit-Stand Transfer Training  Sit-to-Stand Transfer Independence/Assistance Level Maximum assist   Sit-to-Stand Transfer Assist Device Hand held assist   Stand-to-Sit Transfer Independence/Assistance Level Maximum assist   Stand-to-Sit Transfer Assist Device Hand held assist    Handoff Documentation  Handoff Patient in bed;Bed alarm    Endurance  Endurance Comments fair    Therapeutic Functional Activity  Therapeutic Functional Activity Detailed Documentation Developmental Handling    Clinical Impression  Follow up Assessment Pt tolerating EOB activities and first step of mobility progression   Impairments Found (describe specific impairments) BADL's    Frequency/Equipment Recommendations  OT Frequency 2x per week   What day of week is next treatment expected? Tuesday   5/16  OT/OTA completing this assessment GT

## 2022-05-02 NOTE — Progress Notes
[place] [facility]	 [facility]	Hospitalist Service Progress NoteAttending Provider: Sherlyn Lees, MDConsultants: N/ASubjective: Chief complaint: Fever, unspecified fever causeInterim History:  Patient seen resting comfortably, no acute events overnight.  Denies any new complaints this morning except for continued right foot pain which improves sometimes with medications.ROS: All other systems reviewed and are otherwise negative.Review of Allergies/Meds/Hx: I have reviewed the patient's current medications.Current Facility-Administered Medications Medication Dose Route Frequency Last Rate ? acetaminophen  975 mg Oral Q6H PRN   ? apixaban  5 mg Oral Q12H   ? banana flakes-t-galactooligos.  1 packet Oral BID   ? camphor-menthoL   Topical (Top) BID   ? dextrose (GLUCOSE) oral liquid 15 g  15 g Oral Q15 MIN PRN    Or ? fruit juice  120 mL Oral Q15 MIN PRN    Or ? skim milk  240 mL Oral Q15 MIN PRN   ? dextrose (GLUCOSE) oral liquid 30 g  30 g Oral Q15 MIN PRN    Or ? fruit juice  240 mL Oral Q15 MIN PRN   ? dextrose injection  12.5 g Intravenous Q15 MIN PRN   ? dextrose injection  25 g Intravenous Q15 MIN PRN   ? famotidine  20 mg Oral BID   ? ferrous gluconate  324 mg Oral BID WC   ? folic acid  1 mg Oral Daily   ? gabapentin  100 mg Oral BID PRN   ? gabapentin  100 mg Oral TID   ? glucagon  1 mg Intramuscular Once PRN   ? Lidocaine Patch  1 patch Transdermal Q24H   ? melatonin  3 mg Oral Nightly   ? niacin  100 mg Oral Nightly   ? nystatin  1 Application Topical (Top) BID   ? ondansetron  4 mg Oral Q6H PRN    Or ? ondansetron (ZOFRAN) IV Push  4 mg IV Push Q6H PRN   ? polyethylene glycol  17 g Oral DAILY PRN   ? pyridoxine (vitamin B6)  100 mg Oral Daily   ? [Held by provider] rosuvastatin  10 mg Per G Tube Nightly   ? sodium chloride  3 mL IV Push Q8H   ? sodium chloride  3 mL IV Push PRN for Line Care   ? thiamine  100 mg Oral BID   ? traMADoL  50 mg Oral Q12H PRN   Objective: Vitals:Last 24 hours: Temp:  [97.2 ?F (36.2 ?C)-98.3 ?F (36.8 ?C)] 98 ?F (36.7 ?C)Pulse:  [90-101] 101Resp:  [18-20] 20BP: (111-136)/(71-87) 133/83SpO2:  [98 %-99 %] 99 %I/O's:Gross Totals (Last 24 hours) at 05/02/2022 1553Last data filed at 05/02/2022 1050Intake 90 ml Output 750 ml Net -660 ml Physical Exam: General: lying comfortably, no acute distressHEENT:  mucous membranes moistNeck: trachea midline, neck suppleCV: regular rate and rhythm, normal S1, S2Pulm: lungs clear to auscultation bilaterallyGI/GU: soft, nontender, nondistended, normoactive bowel sounds presentMusculoskeletal: no clubbing or cyanosis. Skin: warm and dry, no rashes or ulcerations.Neuro: alert and oriented x 2, moves all extremities equallyLabs: Last 24 hours: No results found for this or any previous visit (from the past 24 hour(s)).Micro:No results found for this or any previous visit (from the past 168 hour(s)).Diagnostics:Reviewed Assessment: 66 y.o.?female?with PMH of RA, recent prolonged hospitalization for perforated diverticulitis (s/p ex Lap/R hemicolectomy with end ileostomy). Admitted 2/24 for LTAC placement and persistent encephalopathy. Found to be febrile without clear infectious source found, fevers resolved. Extensive work-up of encephalopathy and leg weakness unremarkable. Course c/b elevated  LFTs which resolved, likely 2/2 DILI from ceftriaxone or Zosyn. Stable and awaiting T19/LTC placement, but with ongoing significant hypercalcemia.?Mental status appears improved.Plan: # Hx Recurrent Fever/R-foot Cellulitis: improved. -transient fever resolved--last fever 04/05/22, no leukocytosis- PCT decreased 5.95--> 2.08-BCx-04/02/22--NGTD-S/p CTX 2/21 and Zosyn 2/21-2/25-completed ABX-Oxacillin-consulted Podiatry--appreciated recs -Tylenol prn?# Hypercalcemia: improving -Acute on?chronic--resolved-PTH normal (28);?work-up including PTH-rP (9), vitamin D testing, TSH, random cortisol unrevealing-Possibly d/t immobility or primary hyperparathyroidism; -Endocrine consulted -s/p Zolendronic acid 4 mg X1 (3/21)-fasting CTx (Collagen Type-1 Telopeptide)-elevated- repeat calcium tomorrow?# Acute encephalopathy: improved LE weakness:-Unclear etiology, possibly prolonged delirium iso hospitalization from Nov 2022 - currentMRI brain, MRI total spine unremarkable-LP unremarkable-Paraneoplastic and autoimmune encephalopathy panels negative-Overall somewhat improved cognitive status during admission, remains alert and oriented x 1-2-Neurology rec'd EMG to complete work-up of LE weakness but pt declined; Neurology does not recommend any further testing and signed off 02/26/22-c/w Gabapentin 100 mg QHS -BLE US-no DVT??# Perforated diverticulum:?-s/p R hemicolectomy, end ileostomy/LFT elevation-c/b fluid abscess requiring drainage during Nov 2022 admission in [place]. [place]-?. 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?on this admission?now Flasher scan shows improved Peritoneal nodules?Per 3/30 notes, seems imaging was reviewed and that nodules were improving and thought to be resolution of continued debris.-Work-up showed elevated IgG, smooth muscle antibodies c/f possible autoimmune hepatitisInitial concern that imaging showed peritoneal nodules ?carcinomatosis-On review of imaging, nodules improving, more likely to be continued resolution of debris from perforation-Liver tests resolved without intervention, so most likely DILI from antibiotics (ceftriaxone and Zosyn)?# R lateral foot and L medial eminence deep tissue injuries:-Stable-Xray feet with degenerative changes but no acute findings-?Podiatry following - no dressing requirements, WBAT to BLE.No?indication for podiatric surgical intervention at this time. ?Patient may follow-up with Dr. Gazes within 3-4 weeks of discharge.?-?No need for abx at this time --BLE US-no DVT-tramadol 25 mg q12h, prn for pain?# Hx Bilateral DVT:- Apixaban 5mg  BID?# Iron deficiency anemia:-Hematology consult &??signed off - elevated FLC ratio felt to be reactive, plasma cell disorder unlikely ?# Chronic conditions:-Rheumatoid Arthritis - has been off of MTX since November/acute illness, rheumatology signing off current ams/weakness not felt to be related to her RA, follow up outpatient -Bilateral LE DVT - ?provoked from recent surgery, cont lovenox, can resume apixaban at discharge? # Dysphagia:-resolved-s/p ?TF + FWF, regular diet with thin liquids -SLP consulted--for re-evluation-aspiration precautions?# GOC and Capacity:- during hospitalization November 2022 with perforated diverticulum, deemed at this time not to have capacity. PEG was ultimately placed and TFs were initiated. She also cleared for regular diet. T19 and conservatorship hearing set for 05/01/22.  Currently awaiting code degree??Signed:Signed:Artin Mceuen, MD Beeper:  Contact via Mobile heartbeatPager No: [phone] [phone]/12/20233:55 PM5/12/20234:25 PM

## 2022-05-03 NOTE — Plan of Care
Plan of Care Overview/ Patient Status    No changes see flowsheets.  Pt's bilateral foot pain r>l is often resolved with proper foot alignment due to pt's overextended position with bilateral feet at times and pain medicines that help the pain. Call bell within reach. Bed alarm on. Bed in the locked and lowest position. Pt turned q 2 hrs. Safety maintained. Hourly rounding with a purpose completed. See flowsheet for additional information.

## 2022-05-03 NOTE — Progress Notes
[facility]	 [facility]	Hospitalist Service Progress NoteAttending Provider: Sherlyn Lees, MDConsultants: N/ASubjective: Chief complaint: Fever, unspecified fever causeInterim History:  Patient seen resting comfortably, no acute events overnight.  Denies any new complaints this morning did not complain of pain todayRefusing blood workROS: All other systems reviewed and are otherwise negative.Review of Allergies/Meds/Hx: I have reviewed the patient's current medications.Current Facility-Administered Medications Medication Dose Route Frequency Last Rate ? acetaminophen  975 mg Oral Q6H PRN   ? apixaban  5 mg Oral Q12H   ? banana flakes-t-galactooligos.  1 packet Oral BID   ? camphor-menthoL   Topical (Top) BID   ? dextrose (GLUCOSE) oral liquid 15 g  15 g Oral Q15 MIN PRN    Or ? fruit juice  120 mL Oral Q15 MIN PRN    Or ? skim milk  240 mL Oral Q15 MIN PRN   ? dextrose (GLUCOSE) oral liquid 30 g  30 g Oral Q15 MIN PRN    Or ? fruit juice  240 mL Oral Q15 MIN PRN   ? dextrose injection  12.5 g Intravenous Q15 MIN PRN   ? dextrose injection  25 g Intravenous Q15 MIN PRN   ? famotidine  20 mg Oral BID   ? ferrous gluconate  324 mg Oral BID WC   ? folic acid  1 mg Oral Daily   ? gabapentin  100 mg Oral BID PRN   ? gabapentin  100 mg Oral TID   ? glucagon  1 mg Intramuscular Once PRN   ? Lidocaine Patch  1 patch Transdermal Q24H   ? melatonin  3 mg Oral Nightly   ? niacin  100 mg Oral Nightly   ? nystatin  1 Application Topical (Top) BID   ? ondansetron  4 mg Oral Q6H PRN    Or ? ondansetron (ZOFRAN) IV Push  4 mg IV Push Q6H PRN   ? polyethylene glycol  17 g Oral DAILY PRN   ? pyridoxine (vitamin B6)  100 mg Oral Daily   ? [Held by provider] rosuvastatin  10 mg Per G Tube Nightly   ? sodium chloride  3 mL IV Push Q8H   ? sodium chloride  3 mL IV Push PRN for Line Care   ? thiamine  100 mg Oral BID   ? traMADoL  50 mg Oral Q12H PRN   Objective: Vitals:Last 24 hours: Temp:  [98 ?F (36.7 ?C)-98.3 ?F (36.8 ?C)] 98.3 ?F (36.8 ?C)Pulse:  [96-101] 96Resp:  [20] 20BP: (133-146)/(83-89) 146/89SpO2:  [95 %-99 %] 95 %I/O's:Gross Totals (Last 24 hours) at 05/03/2022 0753Last data filed at 05/03/2022 0013Intake 713 ml Output 900 ml Net -187 ml Physical Exam: General: lying comfortably, no acute distressHEENT:  mucous membranes moistNeck: trachea midline, neck suppleCV: regular rate and rhythm, normal S1, S2Pulm: lungs clear to auscultation bilaterallyGI/GU: soft, nontender, nondistended, normoactive bowel sounds presentMusculoskeletal: no clubbing or cyanosis. Skin: warm and dry, no rashes or ulcerations.Neuro: alert and oriented x 2, moves all extremities equallyLabs: Last 24 hours: No results found for this or any previous visit (from the past 24 hour(s)).Micro:No results found for this or any previous visit (from the past 168 hour(s)).Diagnostics:Reviewed Assessment: 66 y.o.?female?with PMH of RA, recent prolonged hospitalization for perforated diverticulitis (s/p ex Lap/R hemicolectomy with end ileostomy). Admitted 2/24 for LTAC placement and persistent encephalopathy. Found to be febrile without clear infectious source found, fevers resolved. Extensive work-up of encephalopathy and leg weakness unremarkable. Course c/b elevated LFTs which resolved,  likely 2/2 DILI from ceftriaxone or Zosyn. Stable and awaiting LTC placement, but with ongoing significant hypercalcemia.?Mental status appears improved.  Conservator was assigned on 05/11 currently awaiting court degreePlan: # Hx Recurrent Fever/R-foot Cellulitis: improved. -transient fever resolved--last fever 04/05/22, no leukocytosis- PCT decreased 5.95--> 2.08-BCx-04/02/22--NGTD-S/p CTX 2/21 and Zosyn 2/21-2/25-completed ABX-Oxacillin-consulted Podiatry--appreciated recs -Tylenol prn?# Hypercalcemia: improving -Acute on?chronic--resolved-PTH normal (28);?work-up including PTH-rP (9), vitamin D testing, TSH, random cortisol unrevealing-Possibly d/t immobility or primary hyperparathyroidism; -Endocrine consulted -s/p Zolendronic acid 4 mg X1 (3/21)-fasting CTx (Collagen Type-1 Telopeptide)-elevated-Pt refusing repeat calcium?# Acute encephalopathy: improved LE weakness:-Unclear etiology, possibly prolonged delirium iso hospitalization from Nov 2022 - currentMRI brain, MRI total spine unremarkable-LP unremarkable-Paraneoplastic and autoimmune encephalopathy panels negative-Overall somewhat improved cognitive status during admission, remains alert and oriented x 1-2-Neurology rec'd EMG to complete work-up of LE weakness but pt declined; Neurology does not recommend any further testing and signed off 02/26/22-c/w Gabapentin 100 mg QHS -BLE US-no DVT??# Perforated diverticulum:?-s/p R hemicolectomy, end ileostomy/LFT elevation-c/b fluid abscess requiring drainage during Nov 2022 admission in [place]. [place]-? 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?on this admission?now [place] scan shows improved Peritoneal nodules?Per 3/30 notes, seems imaging was reviewed and that nodules were improving and thought to be resolution of continued debris.- Work-up showed elevated IgG, smooth muscle antibodies c/f possible autoimmune hepatitisInitial concern that imaging showed peritoneal nodules ?carcinomatosis- On review of imaging, nodules improving, more likely to be continued resolution of debris from perforation- Liver tests resolved without intervention, so most likely DILI from antibiotics (ceftriaxone and Zosyn)?# R lateral foot and L medial eminence deep tissue injuries:-Stable-Xray feet with degenerative changes but no acute findings-?Podiatry following - no dressing requirements, WBAT to BLE.No?indication for podiatric surgical intervention at this time. ?Patient may follow-up with Dr. Gazes within 3-4 weeks of discharge.?--BLE US-no DVT- gabapentin 100 mg t.i.d. for neuropathy pain- Lidoderm patch- tramadol 25 mg q12h, prn for pain # Dysphagia:- resolved currently cleared for regular diet, however also is receiving Jevity boluses?# Chronic conditions:- Rheumatoid Arthritis - has been off of MTX since November/acute illness, rheumatology signing off current ams/weakness not felt to be related to her RA, follow up outpatient - IDA: Hematology consult &??signed off - elevated FLC ratio felt to be reactive, plasma cell disorder unlikely, continue iron supplementation- Bilateral LE DVT -continue Eliquis- GI PPX:  Pepcid 20 mg b.i.d.- HLD:  Statin has been on hold in the setting of elevated hepatic enzymes, after Pt allows next blood draw statin can be resumed- other:  Continue folic acid, niacin, thiamine & vitamin B6??# GOC and Capacity:- during hospitalization November 2022 with perforated diverticulum, deemed at this time not to have capacity. PEG was ultimately placed and TFs were initiated. She also cleared for regular diet. conservatorship hearing was on 05/01/22.  Currently awaiting code degree??Signed:Signed:Austyn Perriello, MD Beeper:  Contact via Mobile heartbeatPager No: [phone] [phone]/13/20233:55 PM5/13/20234:25 PM

## 2022-05-03 NOTE — Plan of Care
Plan of Care Overview/ Patient Status    Assumed care of pt @0700 . Alert to self. No c/o pain at rest. Room air. Vitals in place.Fair po intake. Pills give whole w/water. Feeds administered via PEG. Tolerated well.Ostomy bags on place. Draining adequately. Purewick in place. Call bell in reach. Safety rounds maintained. Problem: Adult Inpatient Plan of CareGoal: Plan of Care ReviewOutcome: Interventions implemented as appropriate Problem: Adult Inpatient Plan of CareGoal: Patient-Specific Goal (Individualized)Outcome: Interventions implemented as appropriate Problem: Impaired Wound HealingGoal: Optimal Wound HealingOutcome: Interventions implemented as appropriate Problem: Fall Injury RiskGoal: Absence of Fall and Fall-Related InjuryOutcome: Interventions implemented as appropriate

## 2022-05-04 NOTE — Plan of Care
Plan of Care Overview/ Patient Status    0700-1900 [name]/O x2; disoriented to time/situation, forgetful. VSS. RA. C/O foot pain gave PRN tramadol. Two ostomys - R +stool +flatus, L ostomy no output. Purewick in place. PEG Jevity 1.5 cartons ordered, patient refused AM feed, allowed 1700 1 carton. Regular diet - encouraged PO intake, fair this shift, calorie count. T/R assist x2 in bed, BLE positioned frequently per patient request. Medications given see MAR. Call bell within reach. Safety maintained.Electronically Signed by Lurline Hare, RN, May 04, 2022

## 2022-05-04 NOTE — Plan of Care
Plan of Care Overview/ Patient Status    No changes overnight, see flowsheets. Call bell within reach. Bed alarm on. Bed in the locked and lowest position. Pt turned q 2 hrs. Safety maintained. Hourly rounding with a purpose completed. See flowsheet for additional information.

## 2022-05-04 NOTE — Progress Notes
[facility]	 [facility]	Hospitalist Service Progress NoteAttending Provider: Sherlyn Lees, MDConsultants: N/ASubjective: Chief complaint: Fever, unspecified fever causeInterim History:  Patient seen resting comfortably, no acute events overnight.  Denies any new complaints this morning did not complain of pain todayReview of Allergies/Meds/Hx: I have reviewed the patient's current medications.Current Facility-Administered Medications Medication Dose Route Frequency Last Rate ? acetaminophen  975 mg Oral Q6H PRN   ? apixaban  5 mg Oral Q12H   ? banana flakes-t-galactooligos.  1 packet Oral BID   ? camphor-menthoL   Topical (Top) BID   ? dextrose (GLUCOSE) oral liquid 15 g  15 g Oral Q15 MIN PRN    Or ? fruit juice  120 mL Oral Q15 MIN PRN    Or ? skim milk  240 mL Oral Q15 MIN PRN   ? dextrose (GLUCOSE) oral liquid 30 g  30 g Oral Q15 MIN PRN    Or ? fruit juice  240 mL Oral Q15 MIN PRN   ? dextrose injection  12.5 g Intravenous Q15 MIN PRN   ? dextrose injection  25 g Intravenous Q15 MIN PRN   ? famotidine  20 mg Oral BID   ? ferrous gluconate  324 mg Oral BID WC   ? folic acid  1 mg Oral Daily   ? gabapentin  100 mg Oral BID PRN   ? gabapentin  100 mg Oral TID   ? glucagon  1 mg Intramuscular Once PRN   ? Lidocaine Patch  1 patch Transdermal Q24H   ? melatonin  3 mg Oral Nightly   ? niacin  100 mg Oral Nightly   ? nystatin  1 Application Topical (Top) BID   ? ondansetron  4 mg Oral Q6H PRN    Or ? ondansetron (ZOFRAN) IV Push  4 mg IV Push Q6H PRN   ? polyethylene glycol  17 g Oral DAILY PRN   ? pyridoxine (vitamin B6)  100 mg Oral Daily   ? [Held by provider] rosuvastatin  10 mg Per G Tube Nightly   ? sodium chloride  3 mL IV Push Q8H   ? sodium chloride  3 mL IV Push PRN for Line Care   ? thiamine  100 mg Oral BID   ? traMADoL  50 mg Oral Q12H PRN   Objective: Vitals:Last 24 hours: Temp:  [97.7 ?F (36.5 ?C)-98.6 ?F (37 ?C)] 98.6 ?F (37 ?C)Pulse:  [79-95] 79Resp:  [18-20] 20BP: (99-120)/(62-76) 99/62SpO2:  [95 %-100 %] 95 %I/O's:Gross Totals (Last 24 hours) at 05/04/2022 1132Last data filed at 05/04/2022 1021Intake 654 ml Output 1300 ml Net -646 ml Physical Exam: General: lying comfortably, no acute distressHEENT:  mucous membranes moistNeck: trachea midline, neck suppleCV: regular rate and rhythm, normal S1, S2Pulm: lungs clear to auscultation bilaterallyGI/GU: soft, nontender, nondistended, normoactive bowel sounds presentMusculoskeletal: no clubbing or cyanosis. Skin: warm and dry, no rashes or ulcerations.Neuro: alert and oriented x 2, moves all extremities equallyLabs: Last 24 hours: No results found for this or any previous visit (from the past 24 hour(s)).Micro:No results found for this or any previous visit (from the past 168 hour(s)).Diagnostics:Reviewed Assessment: 66 y.o.?female?with PMH of RA, recent prolonged hospitalization for perforated diverticulitis (s/p ex Lap/R hemicolectomy with end ileostomy). Admitted 2/24 for LTAC placement and persistent encephalopathy. Found to be febrile without clear infectious source found, fevers resolved. Extensive work-up of encephalopathy and leg weakness unremarkable. Course c/b elevated LFTs which resolved, likely 2/2 DILI from ceftriaxone or Zosyn. Stable and awaiting  LTC placement, but with ongoing significant hypercalcemia.?Mental status appears improved.  Conservator was assigned on 05/11 currently awaiting court degreePlan: # Hx Recurrent Fever/R-foot Cellulitis: improved. - transient fever resolved--last fever 04/05/22, no leukocytosis- PCT decreased 5.95--> 2.08- BCx-04/02/22--NGTD- S/p CTX 2/21 and Zosyn 2/21-2/25- completed ABX-Oxacillin- consulted Podiatry--appreciated recs - Tylenol prn?# Hypercalcemia: improving - Acute on?chronic--resolved- PTH normal (28);?work-up including PTH-rP (9), vitamin D testing, TSH, random cortisol unrevealing- Possibly d/t immobility or primary hyperparathyroidism; - Endocrine consulted - s/p Zolendronic acid 4 mg X1 (3/21)- fasting CTx (Collagen Type-1 Telopeptide)-elevated- Pt refusing further blood draws?# Acute encephalopathy: improved LE weakness:- Unclear etiology, possibly prolonged delirium iso hospitalization from Nov 2022 - currentMRI brain, MRI total spine unremarkable- LP unremarkable- Paraneoplastic and autoimmune encephalopathy panels negative- Overall somewhat improved cognitive status during admission, remains alert and oriented x 1-2- Neurology rec'd EMG to complete work-up of LE weakness but pt declined; Neurology does not recommend any further testing and signed off 02/26/22- c/w Gabapentin 100 mg QHS -BLE US-no DVT??# Perforated diverticulum:?-s/p R hemicolectomy, end ileostomy/LFT elevation-c/b fluid abscess requiring drainage during Nov 2022 admission in [place]. [place]-? 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?on this admission?now [place] scan shows improved Peritoneal nodules?Per 3/30 notes, seems imaging was reviewed and that nodules were improving and thought to be resolution of continued debris.- Work-up showed elevated IgG, smooth muscle antibodies c/f possible autoimmune hepatitisInitial concern that imaging showed peritoneal nodules ?carcinomatosis- On review of imaging, nodules improving, more likely to be continued resolution of debris from perforation- Liver tests resolved without intervention, so most likely DILI from antibiotics (ceftriaxone and Zosyn)?# R lateral foot and L medial eminence deep tissue injuries:- Stable- Xray feet with degenerative changes but no acute findings-?Podiatry following - no dressing requirements, WBAT to BLE.No?indication for podiatric surgical intervention at this time. ?Patient may follow-up with Dr. Gazes within 3-4 weeks of discharge.?- BLE US-no DVT- gabapentin 100 mg t.i.d. for neuropathy pain- Lidoderm patch- tramadol 25 mg q12h, prn for pain # Dysphagia:- resolved currently cleared for regular diet, however also is receiving Jevity boluses?# Chronic conditions:- Rheumatoid Arthritis - has been off of MTX since November/acute illness, rheumatology signing off current ams/weakness not felt to be related to her RA, follow up outpatient - IDA: Hematology consult &??signed off - elevated FLC ratio felt to be reactive, plasma cell disorder unlikely, continue iron supplementation- Bilateral LE DVT -continue Eliquis- GI PPX:  Pepcid 20 mg b.i.d.- HLD:  Statin has been on hold in the setting of elevated hepatic enzymes, after Pt allows next blood draw statin can be resumed- other:  Continue folic acid, niacin, thiamine & vitamin B6??# GOC and Capacity:- during hospitalization November 2022 with perforated diverticulum, deemed at this time not to have capacity. PEG was ultimately placed and TFs were initiated. She also cleared for regular diet. conservatorship hearing was on 05/01/22.  Currently awaiting code decree??Signed:Signed:Antonius Hartlage, MD Beeper:  Contact via Mobile heartbeatPager No: [phone] [phone]/14/20233:55 PM5/14/20234:25 PM

## 2022-05-05 LAB — CBC WITHOUT DIFFERENTIAL
BKR WAM ANALYZER ANC: 3.41 x 1000/ÂµL (ref 2.00–7.60)
BKR WAM HEMATOCRIT (2 DEC): 33.8 % — ABNORMAL LOW (ref 35.00–45.00)
BKR WAM HEMOGLOBIN: 10.7 g/dL — ABNORMAL LOW (ref 11.7–15.5)
BKR WAM MCH (PG): 27.5 pg (ref 27.0–33.0)
BKR WAM MCHC: 31.7 g/dL (ref 31.0–36.0)
BKR WAM MCV: 86.9 fL (ref 80.0–100.0)
BKR WAM MPV: 9.8 fL (ref 8.0–12.0)
BKR WAM PLATELETS: 324 x1000/ÂµL (ref 150–420)
BKR WAM RDW-CV: 15.6 % — ABNORMAL HIGH (ref 11.0–15.0)
BKR WAM RED BLOOD CELL COUNT.: 3.89 M/ÂµL — ABNORMAL LOW (ref 4.00–6.00)
BKR WAM WHITE BLOOD CELL COUNT: 6.2 x1000/ÂµL (ref 4.0–11.0)

## 2022-05-05 LAB — BASIC METABOLIC PANEL
BKR ANION GAP: 9 (ref 7–17)
BKR BLOOD UREA NITROGEN: 10 mg/dL (ref 8–23)
BKR BUN / CREAT RATIO: 29.4 — ABNORMAL HIGH (ref 8.0–23.0)
BKR CALCIUM: 10.1 mg/dL (ref 8.8–10.2)
BKR CHLORIDE: 105 mmol/L (ref 98–107)
BKR CO2: 25 mmol/L (ref 20–30)
BKR CREATININE: 0.34 mg/dL — ABNORMAL LOW (ref 0.40–1.30)
BKR EGFR, CREATININE (CKD-EPI 2021): >60 mL/min/{1.73_m2} (ref >=60)
BKR GLUCOSE: 99 mg/dL (ref 70–100)
BKR POTASSIUM: 3.9 mmol/L (ref 3.3–5.3)
BKR SODIUM: 139 mmol/L (ref 136–144)

## 2022-05-05 NOTE — Plan of Care
Plan of Care Overview/ Patient Status    Pt A/Ox4 this morning, can be forgetful. VSS on RA. Patient with sensitive BLE/painful with movement - medicated per [facility] and repositioned with pillows & heel boots with good effect. Pt with improved appetite, bolus feeds 3xday per orders. Purewick in place draining adequate yellow urine. Right ileostomy with stool output. Left ileostomy -stool/-flatus. Safety maintained.

## 2022-05-05 NOTE — Progress Notes
Inpatient Occupational Therapy Progress NoteDefault Flowsheet Data (most recent)   IP Adult OT Eval/Treat - 05/05/22 1445    Date of Visit / Treatment  Date of Visit / Treatment 05/05/22   End Time 1445    General Information  Pertinent History Of Current Problem Per chart: 66 y.o. female with PMH of RA, recent prolonged hospitalization for perforated diverticulitis (s/p ex Lap/R hemicolectomy with end ileostomy). Admitted 2/24 for LTAC placement and persistent encephalopathy. Found to be febrile without clear infectious source found, fevers resolved. Extensive work-up of encephalopathy and leg weakness unremarkable. Course c/b elevated LFTs which resolved, likely 2/2 DILI from ceftriaxone or Zosyn. Stable and awaiting T19/LTC placement, but with ongoing significant hypercalcemia. Mental status appears improved.   Subjective im here because i had seizures   Precautions/Limitations Fall Precautions;Bed alarm    Prior Level of Functioning/Social History  Additional Comments Pt was brought to [facility] by her family from [facility]. Her sister, [name] has been appointed Acupuncturist (Hearing 05/01/22) and awaiting court decree. Plan is SNF LTC once medically cleared. PLOF is unclear as she has had a prolonged hospital course    Vital Signs and Orthostatic Vital Signs  Vital Signs Vital Signs Stable    Pain/Comfort  Location #1 - PreTreatment Rating (Numbers Scale) --   unquantified significant pain(yelling out) with any touch or ROM of RLE   Patient Coping  Observed Emotional State accepting    Cognition  Overall Cognitive Status Impaired   Cognition Comments pt awake and alert, oriented to self, month, year, not place, reason for admission, length of stay, belives she has been hospitalized for 70 hours. Pt with poor insight into the extent of her physical deficits, poor insight into discharge plans, able to follow commands and make needs known.    Vision/ Hearing Vision Assessment Results No vision deficits noted    Range of Motion  Range of Motion Comments right shoulder tightness at end range of shoulder flexion, pt resisting right hip/knee/ankle ROM 2/2 pain, only allowing minimal ROM, placed pt in off loading boots at end of session as pt resting in significant plantar flexion    Musculoskeletal  LUE Muscle Strength Grading --   3+/5 grossly  RUE Muscle Strength Grading --   shoulder 3-/5, elbow/grip 3+/5   Sensory Assessment  Sensory Assessment Comments highly sensitive to touch to BLE    Skin Assessment  Skin Assessment See Nursing Documentation     AM-PAC - Daily Activity IP Short Form  Help needed from another person putting on/taking off regular lower body clothing 1 - Unable   Help needed from another person for bathing (incl. washing, rinsing, drying) 1 - Unable   Help needed from another person for toileting (incl. using toilet, bedpan, urinal) 1 - Unable   Help needed from another person putting on/taking off regular upper body clothing 2 - A Lot   Help needed from another person taking care of personal grooming such as brushing teeth 2 - A Lot   Help needed from another person eating meals 2 - A Lot   AM-PAC Daily Activity Raw Score (Total of rows above) 9   CMS Score (based on Raw Score - with G Code) 9 - 79.59% impaired     (G Code - CL)    Bed Mobility  Bed Mobility Comments pt refusing to mobilize to eob despite max encouragement and education    Therapeutic Exercise  Therapeutic Exercise Comments reviewed and performed bed level therex.  Clinical Impression  Follow up Assessment pt seen for treatment session on 9.8, only agreeable to bed level therex, limited by impaired cognition and insight, increased pain, gross deconditioning and weakness in BLE's, pt will need STR at discharge.    Frequency/Equipment Recommendations  OT Frequency 2x per week   What day of week is next treatment expected? Wednesday   5/17   OT Discharge Summary  Disposition Recommendation Short Term Rehab

## 2022-05-05 NOTE — Plan of Care
Inpatient Physical Therapy Progress Note IP Adult PT Eval/Treat - 05/05/22 1145    Date of Visit / Treatment  Date of Visit / Treatment 05/05/22   Note Type Progress Note   Progress Report Due 06/02/22   End Time 1145    General Information  Subjective I've been here for 70 hours.   General Observations Pt received supine in bed; on RA; NAD; B ileostomies; purwick; RN cleared   Precautions/Limitations Fall Precautions;Bed alarm    Weight Bearing Status  Weight Bearing Status Comments WBAT BLE    Vital Signs and Orthostatic Vital Signs  Vital Signs Vital Signs Stable   Vital Signs Free text RA; NAD    Pain/Comfort  Pain Comment (Pre/Post Treatment Pain) Pt c/o unquantified BLE pain (R>L) w/ touch and mobilization; RN aware and repositioned for comfort    Patient Coping  Observed Emotional State accepting;cooperative   Verbalized Emotional State acceptance;anger;frustration    Cognition  Overall Cognitive Status Impaired   Orientation Level Oriented to person;Oriented to place;Oriented to time;Disoriented to situation   Level of Consciousness alert;confused   Following Commands Follows one step commands with increased time;Follows one step commands with repetition   Personal Safety / Judgment Fall risk;Requires supervision with mobility   Cognition Comments Intermittent confusion; requires motivation    Range of Motion  Range of Motion Examination WFL except   Range of Motion Comments Limited R shld AROM; tight B HM and heel cords    Manual Muscle Testing  Manual Muscle Testing Comments + Deconditioned    Skin Assessment  Skin Assessment See Nursing Documentation    Balance  Balance Skills Training Comment Bed Level session d/t pain and discomfort    Bed Mobility  Bed Mobility Comments Bed level d/t pain and discomfort. PROM BLE, RUE; A/AAROM BLE, BUE    Handoff Documentation  Handoff Patient in bed;Bed alarm;Patient instructed to call nursing for mobility;Discussed with nursing   Handoff Comments B podus boots on    Endurance  Endurance Comments Fair-    PT- AM-PAC - Basic Mobility Screen- How much help from another person do you currently need.....  Turning from your back to your side while in a a flat bed without using rails? 1 - Total - Requires total assistance or cannot do it at all.   Moving from lying on your back to sitting on the side of a flat bed without using bed rails? 1 - Total - Requires total assistance or cannot do it at all.   Moving to and from a bed to a chair (including a wheelchair)? 1 - Total - Requires total assistance or cannot do it at all.   Standing up from a chair using your arms(e.g., wheelchair or bedside chair)? 1 - Total - Requires total assistance or cannot do it at all.   To walk in a hospital room? 1 - Total - Requires total assistance or cannot do it at all.   Climbing 3-5 steps with a railing? 1 - Total - Requires total assistance or cannot do it at all.   AMPAC Mobility Score 6   TARGET Highest Level of Mobility Mobility Level 2, Turn self in bed/bed activity/dependent transfer    ACTUAL Highest Level of Mobility Mobility Level 2, Turn self in bed/bed activity/ dependent transfer    Therapeutic Exercise  Therapeutic Exercise Comments PROM BLE- hip flex/ext, knee flex/ext, ankle DF, RUE - shld flex/ext, elbow flex/ext; A/AAROM BLE - hip flex/ext, knee flex/ext, ankle DF, BUE - shld  flex/ext, elbow flex/ext    Therapeutic Functional Activity  Therapeutic Functional Activity Comments Pt edu on benefits of mobilization; repositioning    Clinical Impression  Follow up Assessment Pt seen for PT f/u. Bed level session d/t increased pain and discomfort to BLE. Performed BLE and BUE PROM and A/AAROM. Pain and sensitivity to touch are greatest limiting factors to therapy. Repositioned for comfort at end session. Will benefit from skilled services to improve gross strength/ROM, postural control, and activity tolerance. Current PT d/c rec is STR.   Criteria for Skilled Therapeutic Interventions Met yes    Patient/Family Stated Goals  Patient/Family Stated Goal(s) return to prior level of functioning;decrease pain    Frequency/Equipment Recommendations  PT Frequency 2x per week   What day of week is next treatment expected? Wednesday   5/17  PT/PTA completing this assessment RL   Equipment Needs During Admission/Treatment No device    PT Recommendations for Inpatient Admission  Activity/Level of Assist dangle edge of bed;assist of 2    Planned Treatment / Interventions  Plan for Next Visit Progress as tol. STS Ax2   Training Treatment / Interventions Balance / Investment banker, operational;Endurance Training;Functional Personnel officer / Interventions Patient Education / Training    PT Discharge Summary  Physical Therapy Disposition Recommendation Short Term Rehab     Shanon Payor PT, [place]: [phone]

## 2022-05-05 NOTE — Plan of Care
Plan of Care Overview/ Patient Status    Per DPR, patient remains medically cleared, pending placement. Per SW Abran Duke, patient's sister [name] has been appointed Acupuncturist and awaiting court decree. Plan is SNF LTC once medically cleared. CM will continue to follow and assist. Melina Copa, MSW, LCSWCare [phone]

## 2022-05-05 NOTE — Plan of Care
Danielle [name]					Location: EP 97/9830-A66 y.o., female				Attending: Lianne Bushy,*	Admit Date: 02/11/2022			[id number] LOS: 83 days NUTRITION FOLLOW UP & CALORIE COUNT Dietary Orders (From admission, onward)     Start     Ordered  04/24/22 1446  Diet Tube Feed With Tray  (Diet Tube Feed Panel)  DIET EFFECTIVE NOW      Comments: JEVITY 1.5 @ 1.5 carton bolus @9am  and 1 carton BID (5pm, 9pm) to total 3.5 cartons daily Question Answer Comment Diet Selection? Regular  Safety Tray? (Suicide/Homicide/Police Custody) Safety Tray - No  Adult Tube Feed Formulas: Jevity 1.5 ([facility],SRC)  Feeding Route: G-Tube  Initiate Nutrition Management Protocol (Yes/No?) Yes - Initiate Protocol    04/24/22 1446    No Known AllergiesANTHROPOMETRICS:Height:???70Admit wt:?79.4 kg (per SNF documentation)Current wt:?73.6 kg (5/14 bed) BMI:?23.29??(based on?current wt)?Admit wt trends:?05/04/22 0547 73.6 kg Bed 04/30/22 0900 73.5 kg Bed 04/25/22 0847 76.7 kg Bed 04/02/22 0829 74.6 kg Bed 03/31/22 0809 72.7 kg Bed 03/29/22 0825 73.8 kg Bed 03/28/22 0752 73 kg Bed 03/27/22 0739 73.2 kg Bed 03/26/22 0803 71.3 kg Bed 03/25/22 0826 71.5 kg Bed 03/22/22 0804 71 kg Bed 03/20/22 0812 72.6 kg Bed 03/19/22 0824 72.4 kg Bed ?Dosing Wt:?73?kg (based on?current wt trends and normal BMI)Additional wt information:Ht confirmed with pt.?Pt last known wt was 98.6 kg (12/13/2018).?Wt loss?confirmed with admit wt?as pt no longer weighs?98 kg. Unsure time frame of wt loss.?1-2+ edema?b/l LE?per flowsheets. Pt appears to be maintaining relatively stable weight with current feeding. ?ESTIMATED NUTRITION REQUIREMENTS:Kcal/day:?1825??????????????	(25 ?kcal/kg)Protein/day:?73-87 ?????????????(1-1.2gm/kg)Fluids/day:?1825 mL/d?(67mL/kcal) ONLY as medical and fluid status allows and only per team discretion. Needs based on:?Dosing Wt (73?kg), maintenance.??NUTRITION ASSESSMENT:?Chart reviewed for follow up and calorie count. Labs and meds reviewed, banatrol ordered BID. Pt was placed on non-select starting on 5/13. Visited pt at bedside, she reports eating a little something from each plate she gets, she feels that she eats about 75% of the food brought to her. Suspect pt overshot her intake a little bit but RN at bedside reports she has recently been eating better.  Calorie Count:5/12: 518 kcal, 15 gm protein (1 meal ordered, 1 meal documented)5/13: 495 kcal, 35 gm protein (3 meals ordered, 1 meal documented) 5/14: 828 kcal, 24 gm protein (3 meals ordered, 2 meals documented) Average: 614 kcal, 25 gm protein Suspect calorie count is not a complete picture of the pt's nutrition, as there was some missng documentation. Looking at the numbers this seems to be within range of the last calorie count findings (744 kcal, 26 gm protein avg). With TF and current PO intake pt is meeting 100% of needs. Will recommend to d/c cal count as this is likely going to be baseline for pt. ?Cultural/Religious/Ethnic Needs:?Unknown??Food Allergies: NKFA??NUTRITION DIAGNOSIS:Severe malnutrition r/t depletion aeb moderate muscle and fat depletion.- continues??INTERVENTIONS/RECOMMENDATIONS:?Meals and Snacks:-?Continue Regular?diet- would consider removing additional vitamin and mineral supplementations in setting of receiving full goal rate supportive feeds.?- D/c cal countGoal:?po intake?for comfort given support provided by?goal?volume bolus feeds, continue?Nutrition supplement:No need for oral supplements??Enteral Nutrition:?	Continue?JEVITY 1.5?@ 1.5?carton bolus @9am ?and?1 carton?BID (5pm,?9pm)?to total 3.5 cartons daily-	Provides: , 1244 kcal, 53gm protein, free water-	Consider adding back in the 1.5 carton bolus at 1 pm if intake drops below 25%Goal: ?-	Patient will receive >90% of goal volume TF/ TPN prior to next nutrition assessment-?met will continue? ??Discharge Planning and Transfer of Nutrition Care:??Nutrition related discharge needs still being determined at this time, will continue to follow ??MONITORING / EVALUATION:?Food/Nutrition-Related History:Food and Nutrient Intake?Anthropometric Measurements:Weight ?Biochemical Data, Medical Tests and Procedures:Electrolyte and renal profileGlucose/endocrine profile?Nutrition-Focus Physical  FindingsOverall findingsElectronically Signed by Karyl Kinnier, Dietetic Intern May 8, 2023Please note: Nutrition is a consult only service on weekends and holidays.  Please enter a consult in EPIC if assistance is needed on a weekend or holiday or via [facility] [phone] to contact the covering RD.

## 2022-05-05 NOTE — Progress Notes
[facility]	 [facility]	Medicine Progress NoteHospitalist ServiceAttending Provider: Harlen Labs Suella Grove, MD [phone]:  9830/9830-AHospital Day #83 Subjective: Interim History: patient seen and examinedFeeling well has no pain. Answered simple questions appropriately. Told me she would stay with her sister when dcReview of Allergies/Meds/Hx: I have reviewed the patient's: allergies, current scheduled medications, current infusions, current prn medications, past medical history and past surgical historyObjective: Vitals:Temp:  [97.5 ?F (36.4 ?C)-98.3 ?F (36.8 ?C)] 97.5 ?F (36.4 ?C)Pulse:  [75-94] 85Resp:  [18-20] 18BP: (100-137)/(66-77) 100/66SpO2:  [96 %-97 %] 97 %Device (Oxygen Therapy): room air  I/O's:I have reviewed the patient's current I&O's as documented in the EMR.Gross Totals (Last 24 hours) at 05/05/2022 1239Last data filed at 05/05/2022 0800Intake 1403 ml Output 1000 ml Net 403 ml Physical ExamHENT:    Head: Atraumatic.    Mouth/Throat:    Pharynx: Oropharynx is clear. Eyes:    General:       Right eye: No discharge.       Left eye: No discharge. Cardiovascular:    Rate and Rhythm: Normal rate. Pulmonary:    Effort: Pulmonary effort is normal. Abdominal:    General: Bowel sounds are normal.    Palpations: Abdomen is soft.    Tenderness: There is no abdominal tenderness. Musculoskeletal:       General: No swelling.    Cervical back: Neck supple. Skin:   General: Skin is warm and dry. Neurological:    Mental Status: She is alert.    Comments: Knew her name, in hospital. 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, history of bilateral DVTs (eliquis) brought in by family with persistently encephalopathy and FTT, family requesting LTACH placementCourse notable for displaced G tube (replaced) DILI persistently encephalopathy with unremarkable work up, R foot cellulitis, hypercalcemia (resolved) Plan: Chronic med issues-Iron def replete-Neuropathy on gabapentin, lidocaine patch-Encephalopathy, DILI on vitamin supplements, statin remains held-Malnutrition, dysphagia, G tube feeds -History of DVT, on eliquisdispo hearing 5/11, awaiting court decree re conservatorshipFull CodeSigned:Shimeka Bacot Verlin Fester MD MBAHospitalist attendingMobile [phone] 12:30 PM

## 2022-05-06 NOTE — Plan of Care
Plan of Care Overview/ Patient Status    See flowsheets, no changes. Call bell within reach. Bed alarm on. Bed in the locked and lowest position. Pt turned q 2 hrs. Safety maintained. Hourly rounding with a purpose completed. See flowsheet for additional information.

## 2022-05-06 NOTE — Care Coordination-Inpatient
Call received from patients sister [name] ph#[phone] she informed this Clinical research associate that she would like her sister placed in the [facility] area for [employer][name] and I discussed the CCN list for STR and she provided this Clinical research associate permission to initiate the search for STR to within a 20 to 30 mile radius of her home address referrals have been sent out for review. We are currently awaiting responses.Care management will continue to follow along with the patient's medical team as needed.Troy JonesTransition CoordinatorCare Management ([facility])Ph#[phone]#[phone]#[phone]

## 2022-05-06 NOTE — Progress Notes
[facility]	 [facility]	Medicine Progress NoteHospitalist ServiceAttending Provider: Harlen Labs Suella Grove, MD [phone]:  9830/9830-AHospital Day #84 Subjective: Interim History: patient seen and examinedFeeling fine, no new complaintsReview of Allergies/Meds/Hx: I have reviewed the patient's: allergies, current scheduled medications, current infusions, current prn medications, past medical history and past surgical historyObjective: Vitals:Temp:  [97.6 ?F (36.4 ?C)-98.4 ?F (36.9 ?C)] 97.6 ?F (36.4 ?C)Pulse:  [74-98] 98Resp:  [16-20] 16BP: (113-127)/(71-84) 127/84SpO2:  [97 %-98 %] 98 %Device (Oxygen Therapy): room air  I/O's:I have reviewed the patient's current I&O's as documented in the EMR.Gross Totals (Last 24 hours) at 05/06/2022 1027Last data filed at 05/06/2022 0500Intake 704 ml Output 1550 ml Net -846 ml Physical ExamHENT:    Head: Atraumatic.    Mouth/Throat:    Pharynx: Oropharynx is clear. Eyes:    General:       Right eye: No discharge.       Left eye: No discharge. Cardiovascular:    Rate and Rhythm: Normal rate. Pulmonary:    Effort: Pulmonary effort is normal. Abdominal:    General: Bowel sounds are normal.    Palpations: Abdomen is soft.    Tenderness: There is no abdominal tenderness. Musculoskeletal:       General: No swelling.    Cervical back: Neck supple. Skin:   General: Skin is warm and dry. Neurological:    Mental Status: She is alert.    Comments: Answered simple questions appropriately, followed simple commands Labs:    Diagnostics:ECG/Tele Events: Assessment: Assessment:65yo f history of RA, history of perforated diverticulitis (s/p ex lap and R hemicolectomy, end ileostomy) bilateral DVTs (eliquis) history of dysphagia and malnutrition, s/p G tube placement, persistent encephalopathy, who presented on 2/21 by the request of her family to be transferred to [facility], she had persistent encephalopathy (unrevealing work up) and FTT, and course here was complicated by DILI, dislodged G tube which was replaced, R foot cellulitis with fever, and hypercalcemiaShe is awaiting conservatorship and placement (long term)Plan: Chronic med issues-DVTs, on eliquis-Iron replacement-Dyslipidemia, statin had been held due to DILI, since LFTs have improved, can resume and monitor-Malnutrition DILI on vitamins, jevity tube feeds. She is also on a modified diet for dysphagia-Neuropathy, chronic pain, on gabapentin, lidocaine patch, prn tramadolDVT pxdispo pending conservatorship decree, court hearing was on 5/11Full CodeSigned:Yardley Lekas Verlin Fester MD MBAHospitalist attendingMobile [phone] [phone] 10:26 AM

## 2022-05-07 NOTE — Plan of Care
Plan of Care Overview/ Patient Status    Uneventful shift. VSS on RA. Intermittent BLE neuropathic pain managed with repositioning with pillows/heel boots and medicated per MAR with good effect. Pt tolerated 75% breakfast and intermittent bolus TFs however refused lunch and dinner stating she feels full. Pt hydrating with bottled water throughout shift. Purewick patent with adequate CY urine. Right ileostomy with loose stool. Safety maintained.

## 2022-05-07 NOTE — Progress Notes
[facility]	 [facility]	Medicine Progress NoteHospitalist ServiceAttending Provider: Harlen Labs Suella Grove, MD [phone]:  9830/9830-AHospital Day #85 Subjective: Interim History: patient seen and examinedFeeling well no new complaintsReview of Allergies/Meds/Hx: I have reviewed the patient's: allergies, current scheduled medications, current infusions, current prn medications, past medical history and past surgical historyObjective: Vitals:Temp:  [97.8 ?F (36.6 ?C)-98.6 ?F (37 ?C)] 98 ?F (36.7 ?C)Pulse:  [63-104] 63Resp:  [16-18] 16BP: (106-122)/(68-83) 108/68SpO2:  [95 %-96 %] 95 %Device (Oxygen Therapy): room air  I/O's:I have reviewed the patient's current I&O's as documented in the EMR.Gross Totals (Last 24 hours) at 05/07/2022 1058Last data filed at 05/07/2022 0600Intake 617 ml Output 1275 ml Net -658 ml Physical ExamHENT:    Head: Atraumatic.    Mouth/Throat:    Pharynx: Oropharynx is clear. Eyes:    General:       Right eye: No discharge.       Left eye: No discharge. Cardiovascular:    Rate and Rhythm: Normal rate. Pulmonary:    Effort: Pulmonary effort is normal. Abdominal:    General: Bowel sounds are normal.    Palpations: Abdomen is soft.    Tenderness: There is no abdominal tenderness.    Comments: Ostomy bag empty. There is some brownish dried blood/discharge on G tube dressing guaze, some brownish discharge around tube, no overt bleeding noted, no surrounding erythema  Musculoskeletal:       General: No swelling.    Cervical back: Neck supple. Skin:   General: Skin is warm and dry. Neurological:    Mental Status: She is alert. Mental status is at baseline. Labs:    Diagnostics:ECG/Tele Events: Assessment: Assessment:66yo f history of RA, history of diverticular perforation, dist sigmoid colon stricture, s/p ex lap (R hemicolectomy, end ileostomy) bilateral DVTs, first admitted in Nov 2022 following which she had persistent encephalopathy with unrevealing work up. She also had abdo abscess which was drained in Dec 2022, following a complicated course in which ethics was involved, she eventually had a G Tube placed for nutrition and went to a STR facility in [place] in Feb 2023, she was then brought back to [facility] as her sister was concerned about her ongoing encephalopathy, FTT, and requested she be admitted to [facility] admission was complicated by dislodged G tube (replaced in Mar), R foot cellulitis, hypercalcemia, DILI (from antibiotics) now awaiting conservatorship  Decree (hearing 5/11) and LTC placement as her mental status has not improved Plan: Chronic med issues-Dysphagia, on modified diet, also G tube jevity feeds due to FTT. Continue vitamins-Chronic pain on lidocaine patch, gabapentin for neuropathy, prn tylenol-Dyslipidemia on statin -Iron replacementDVT px eliquisdispo pending conservatorship and placementFull CodeSigned:Jianni Batten Verlin Fester MD MBAHospitalist attendingMobile [phone] 10:58 AM

## 2022-05-07 NOTE — Plan of Care
Plan of Care Overview/ Patient Status    Per DPR, patient remains medically cleared, pending placement. Per SW Abran Duke, patient's sister [name] has been appointed Acupuncturist and awaiting court decree. Plan is SNF LTC once medically cleared. CM will continue to follow and assist. Melina Copa, MSW, LCSWCare [phone]

## 2022-05-07 NOTE — Plan of Care
Plan of Care Overview/ Patient Status    Pt Ax2, No changes overnight, see flowsheets. Pt is medically cleared and pending placement, court hearing on 5/11 pending conservatorship decree.Call bell within reach. Bed alarm on. Bed in the locked and lowest position. Pt turned q 2 hrs. Safety maintained. Hourly rounding with a purpose completed. See flowsheet for additional information.

## 2022-05-08 NOTE — Plan of Care
Plan of Care Overview/ Patient Status    No changes overnight, see flowsheets for info.  Ileiostomies x 2 changed 05/07/22. Pt medically cleared and awaiting placement and conservatorship decree that was filed 05/01/22 per sister. Call bell within reach. Bed alarm on. Bed in the locked and lowest position. Pt turned q 2 hrs. Safety maintained. Hourly rounding with a purpose completed. See flowsheet for additional information.

## 2022-05-08 NOTE — Plan of Care
Plan of Care Overview/ Patient Status    Medical Social Work Follow Up  [employer] Most Recent Value Admission Information  Document Type Progress Note Reason for Inability to Assess --  [Patient mental status is altered] (For Inpatient/ED Only) Prior psychosocial assessment has been documented within this hospitalization No (For Inpatient/ED Only) Prior psychosocial assessment has been documented within 30 days of this hospitalization No Reason for Current Social Work Press photographer Medical Team Comment Medical Team, Care Management Community Agency Comment [place] [facility] Record Reviewed Yes Level of Care Inpatient Psychosocial issues requiring intervention Conservatorship Psychosocial interventions 5 minutes providing updates to the Medical Team and Care Manager. Final Decree from [employer] was received appointing Danielle Pellegrini [place]) as the [place] of Person and [place]. Collaborations [employer], Medical Team, Care Management Specific referrals to enhance community supports (include existing and new resources) None Handoff Required? No Next Steps/Plan (including hand-off): I will remain involved for support as needed. Signature: Leim Fabry, LCSW Contact Information: [phone]

## 2022-05-08 NOTE — Plan of Care
Plan of Care Overview/ Patient Status    [name]&Ox2, vss, complains of BL LE pain, administered tramadol with positive effect. Peg tube with tube feed as scheduled. Tolerated well. PO intake fair. Meals ordered and encouragement provided. Colostomy with moderate output. T&P q 2hr. Podus boots on. Safety maintained, bed alarm on, call bell in reach.

## 2022-05-08 NOTE — Progress Notes
Danielle [name] day Progress NoteAttending Provider: Michaelle Copas, MD Subjective                                                                              Subjective: Interim History: Denies any complaints, no acute events reported overnightReview of Allergies/Meds/Hx: Review of Allergies/Meds/Hx:I have reviewed the patient's: allergies and current scheduled medicationsScheduled Meds:Current Facility-Administered Medications Medication Dose Route Frequency Provider Last Rate Last Admin ? apixaban (ELIQUIS) tablet 5 mg  5 mg Oral Q12H Pryor Ochoa, MD   5 mg at 05/08/22 0957 ? Banatrol Plus oral powder packet  1 packet Oral BID Pryor Ochoa, MD   1 packet at 05/08/22 1000 ? camphor-menthoL (SARNA) lotion   Topical (Top) BID Manzon, Berkley Harvey, MD   Given at 05/08/22 0959 ? famotidine (PEPCID) tablet 20 mg  20 mg Oral BID Pryor Ochoa, MD   20 mg at 05/08/22 4403 ? ferrous gluconate (FERGON) tablet 324 mg  324 mg Oral BID WC Pryor Ochoa, MD   324 mg at 05/08/22 1715 ? folic acid (FOLVITE) tablet 1 mg  1 mg Oral Daily Pryor Ochoa, MD   1 mg at 05/08/22 0959 ? gabapentin (NEURONTIN) capsule 100 mg  100 mg Oral TID Marlon Pel, MD   100 mg at 05/08/22 1714 ? lidocaine (LIDODERM) 5 % 1 patch  1 patch Transdermal Q24H Simeon Craft, MD   1 patch at 05/07/22 2150 ? melatonin tablet 3 mg  3 mg Oral Nightly Pryor Ochoa, MD   3 mg at 05/07/22 2150 ? niacin Immediate Release tablet 100 mg  100 mg Oral Nightly Pryor Ochoa, MD   100 mg at 05/07/22 2150 ? nystatin (MYCOSTATIN) powder 1 Application  1 Application Topical (Top) BID Henriette Combs, MD   1 Application at 05/08/22 1000 ? pyridoxine (vitamin B6) (B-6) tablet 100 mg  100 mg Oral Daily Pryor Ochoa, MD   100 mg at 05/08/22 0959 ? rosuvastatin (CRESTOR) tablet 10 mg  10 mg Per G Tube Nightly Dorisann Frames, MD   10 mg at 05/07/22 2150 ? sodium chloride 0.9 % flush 3 mL  3 mL IV Push Q8H Dorisann Frames, MD   3 mL at 05/07/22 2155 ? thiamine (VITAMIN B1) tablet 100 mg  100 mg Oral BID Pryor Ochoa, MD   100 mg at 05/08/22 0958 Continuous Infusions:PRN Meds:.acetaminophen, dextrose (GLUCOSE) oral liquid 15 g **OR** fruit juice **OR** skim milk, dextrose (GLUCOSE) oral liquid 30 g **OR** fruit juice, dextrose injection, dextrose injection, gabapentin, glucagon, ondansetron **OR** ondansetron (ZOFRAN) IV Push, polyethylene glycol, sodium chloride, traMADoL Objective Objective: Vitals:Last 24 hours: Temp:  [97.7 ?F (36.5 ?C)-98.8 ?F (37.1 ?C)] 98.8 ?F (37.1 ?C)Pulse:  [87-102] 99Resp:  [18-19] 18BP: (116-135)/(77-88) 135/88SpO2:  [95 %-100 %] 100 %I/O's:I have reviewed the patient's current I&O's as documented in the EMR.Procedures:None Physical Exam HENT:    Head: Atraumatic.    Mouth/Throat:    Pharynx: Oropharynx is clear. Eyes:    General:       Right eye: No discharge.       Left eye: No discharge. Cardiovascular:    Rate and Rhythm: Normal rate. Pulmonary:    Effort:  Pulmonary effort is normal. Abdominal:    General: Bowel sounds are normal.    Palpations: Abdomen is soft.    Tenderness: There is no abdominal tenderness.    Comments: Ostomy bag empty. There is some brownish dried blood/discharge on G tube dressing guaze, some brownish discharge around tube, no overt bleeding noted, no surrounding erythema  Musculoskeletal:       General: No swelling.    Cervical back: Neck supple. Skin:   General: Skin is warm and dry. Neurological:    Mental Status: She is alert. Mental status is at baseline.  ?Labs:Last 24 hours: No results found for this or any previous visit (from the past 24 hour(s)).Diagnostics:No new radiology.ECG/Tele Events: No ECG ordered today Assessment Assessment: Assessment:66yo f history of RA, history of diverticular perforation, dist sigmoid colon stricture, s/p ex lap (R hemicolectomy, end ileostomy) bilateral DVTs, first admitted in Nov 2022 following which she had persistent encephalopathy with unrevealing work up. She also had abdo abscess which was drained in Dec 2022, following a complicated course in which ethics was involved, she eventually had a G Tube placed for nutrition and went to a STR facility in [place] in Feb 2023, she was then brought back to [name] as her sister was concerned about her ongoing encephalopathy, FTT, and requested she be admitted to [facility] admission was complicated by dislodged G tube (replaced in Mar), R foot cellulitis, hypercalcemia, DILI (from antibiotics) now awaiting conservatorship  Decree (hearing 5/11) and LTC placement as her mental status has not improved  Plan Plan: Dysphagia:Evaluated by speech therapy during this hospitalization, she is currently on modified diet, will continue G-tube feeds.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

## 2022-05-08 NOTE — Plan of Care
Plan of Care Overview/ Patient Status    Per DPR, patient remains medically cleared, pending placement. Per SW Abran Duke, she received the decree today. Plan is SNF LTC, CM sent message to [facility]. [place] and [facility] in regards to acceptance. CM will continue to follow and assist. Melina Copa, MSW, LCSWCare [phone]

## 2022-05-09 ENCOUNTER — Inpatient Hospital Stay: Admit: 2022-05-09 | Payer: PRIVATE HEALTH INSURANCE

## 2022-05-09 DIAGNOSIS — R627 Adult failure to thrive: Secondary | ICD-10-CM

## 2022-05-09 NOTE — Plan of Care
Plan of Care Overview/ Patient Status    No changes, see flowsheets. Pt medically cleared and pending SNF placement, conservatorship decree was presented 05/08/22. Call bell within reach. Bed alarm on. Bed in the locked and lowest position. Pt turned q 2 hrs. Safety maintained. Hourly rounding with a purpose completed. See flowsheet for additional information.

## 2022-05-09 NOTE — Progress Notes
[name] day Progress NoteAttending Provider: Michaelle Copas, MD Subjective                                                                              Subjective: Interim History:  She is not able to extend her knees, states she is been having severe bilateral knee pain for 8 months.Review of Allergies/Meds/Hx: Review of Allergies/Meds/Hx:I have reviewed the patient's: allergies and current scheduled medicationsScheduled Meds:Current Facility-Administered Medications Medication Dose Route Frequency Provider Last Rate Last Admin ? apixaban (ELIQUIS) tablet 5 mg  5 mg Oral Q12H Pryor Ochoa, MD   5 mg at 05/09/22 0809 ? Banatrol Plus oral powder packet  1 packet Oral BID Pryor Ochoa, MD   1 packet at 05/09/22 0809 ? camphor-menthoL (SARNA) lotion   Topical (Top) BID Manzon, Berkley Harvey, MD   Given at 05/09/22 0810 ? famotidine (PEPCID) tablet 20 mg  20 mg Oral BID Pryor Ochoa, MD   20 mg at 05/09/22 0809 ? ferrous gluconate (FERGON) tablet 324 mg  324 mg Oral BID WC Pryor Ochoa, MD   324 mg at 05/09/22 1742 ? folic acid (FOLVITE) tablet 1 mg  1 mg Oral Daily Pryor Ochoa, MD   1 mg at 05/09/22 0809 ? gabapentin (NEURONTIN) capsule 100 mg  100 mg Oral TID Marlon Pel, MD   100 mg at 05/09/22 1423 ? lidocaine (LIDODERM) 5 % 1 patch  1 patch Transdermal Q24H Simeon Craft, MD   1 patch at 05/08/22 2047 ? melatonin tablet 3 mg  3 mg Oral Nightly Pryor Ochoa, MD   3 mg at 05/08/22 2048 ? niacin Immediate Release tablet 100 mg  100 mg Oral Nightly Pryor Ochoa, MD   100 mg at 05/08/22 2048 ? nystatin (MYCOSTATIN) powder 1 Application  1 Application Topical (Top) BID Henriette Combs, MD   1 Application at 05/09/22 [phone] ? pyridoxine (vitamin B6) (B-6) tablet 100 mg  100 mg Oral Daily Pryor Ochoa, MD   100 mg at 05/09/22 9604 ? rosuvastatin (CRESTOR) tablet 10 mg  10 mg Per G Tube Nightly Dorisann Frames, MD   10 mg at 05/08/22 2048 ? sodium chloride 0.9 % flush 3 mL  3 mL IV Push Q8H Dorisann Frames, MD   3 mL at 05/08/22 2116 ? thiamine (VITAMIN B1) tablet 100 mg  100 mg Oral BID Pryor Ochoa, MD   100 mg at 05/09/22 0809 Continuous Infusions:PRN Meds:.acetaminophen, dextrose (GLUCOSE) oral liquid 15 g **OR** fruit juice **OR** skim milk, dextrose (GLUCOSE) oral liquid 30 g **OR** fruit juice, dextrose injection, dextrose injection, gabapentin, glucagon, ondansetron **OR** ondansetron (ZOFRAN) IV Push, polyethylene glycol, sodium chloride, traMADoL Objective Objective: Vitals:Last 24 hours: Temp:  [97.4 ?F (36.3 ?C)-98.4 ?F (36.9 ?C)] 98.4 ?F (36.9 ?C)Pulse:  [92-105] 97Resp:  [18-20] 20BP: (110-129)/(63-82) 122/79SpO2:  [98 %-100 %] 98 %I/O's:I have reviewed the patient's current I&O's as documented in the EMR.Procedures:None Physical Exam Head: Atraumatic. ???Mouth/Throat: ???Pharynx: Oropharynx is clear. Eyes: ???General: ??????Right eye: No discharge. ??????Left eye: No discharge. Cardiovascular: ???Rate and Rhythm: Normal rate. Pulmonary: ???Effort: Pulmonary effort is normal. Abdominal: ???General: Bowel sounds are normal. ???Palpations: Abdomen is soft. ???Tenderness: There is no  abdominal tenderness. ???Comments: Ostomy bag noted?Musculoskeletal: ??????General: No swelling. ???Cervical back: Neck supple. Skin:???General: Skin is warm?and dry. Neurological: ???Mental Status: She is alert. Mental status is?at baseline.??Labs:Last 24 hours: No results found for this or any previous visit (from the past 24 hour(s)).Diagnostics:No new radiology.ECG/Tele Events: No ECG ordered today Assessment Assessment: Assessment:66yo f history of RA, history of diverticular perforation, dist sigmoid colon stricture, s/p ex lap (R hemicolectomy, end ileostomy) bilateral DVTs, first admitted in Nov 2022 following which she had persistent encephalopathy with unrevealing work up. She also had abdo abscess which was drained in Dec 2022, following a complicated course in which ethics was involved, she eventually had a G Tube placed for nutrition and went to a STR facility in [place] in Feb 2023, she was then brought back to [facility] as her sister was concerned about her ongoing encephalopathy, FTT, and requested she be admitted to [facility] admission was complicated by dislodged G tube (replaced in Mar), R foot cellulitis, hypercalcemia, DILI (from antibiotics) now awaiting conservatorship ?Decree (hearing 5/11) and LTC placement as her mental status has not improved ? Plan Plan: Bilateral knee pain, history of rheumatoid arthritis:Will obtain bilateral knee x-rays.She is not on any disease modifying agents.She was evaluated by rheumatology during this admission, he has been off of methotrexate in November.Acute encephalopathy: improved MRI brain, MRI total spine unremarkable- LP unremarkable- Paraneoplastic and autoimmune encephalopathy panels negative- Overall somewhat improved cognitive status during admission, remains alert and oriented x 1-2- Neurology rec'd EMG to complete work-up of LE weakness but pt declined; Neurology does not recommend any further testing and signed off 02/26/22- c/w Gabapentin 100 mg QHS -BLE US-no DVTPerforated diverticulum:?-s/p R hemicolectomy, end ileostomy/LFT elevation-c/b fluid abscess requiring drainage during Nov 2022 admission in [place]. [place]-? 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signed off - elevated FLC ratio felt to be reactive, plasma cell disorder unlikely, continue iron supplementation?- Bilateral LE DVT -continue Eliquis?- GI PPX:  Pepcid 20 mg b.i.d.?- HLD:  Statin has been on hold in the setting of elevated hepatic enzymes, after Pt allows next blood draw statin can be resumed?GOC and Capacity:- during hospitalization November 2022 with perforated diverticulum, deemed at this time not to have capacity. PEG was ultimately placed and TFs were initiated. She also cleared for regular diet.?conservatorship hearing was on 05/01/22.  ? Electronically Signed:Itzayana Pardy, MD Beeper [id number]/19/2023, 6:19 PM

## 2022-05-09 NOTE — Plan of Care
Plan of Care Overview/ Patient Status    Per DPR, patient remains medically cleared, pending placement. Plan is SNF LTC;  CM sent message to [facility] [place], they can offer bed but will need to reach out to patient's Sister/Conservator for financial disclosure. CM will continue to follow and assist. Melina Copa, MSW, LCSWCare [phone]

## 2022-05-09 NOTE — Plan of Care
Plan of Care Overview/ Patient Status    Medical Social Work Follow Up  [employer] Most Recent Value Admission Information  Document Type Progress Note Reason for Inability to Assess --  [Patient mental status is altered] (For Inpatient/ED Only) Prior psychosocial assessment has been documented within this hospitalization No (For Inpatient/ED Only) Prior psychosocial assessment has been documented within 30 days of this hospitalization No Reason for Current Social Work Heritage manager Medical Team Comment Medical Team, Care Management Community Agency Comment [place] [address] Record Reviewed Yes Level of Care Inpatient Psychosocial issues requiring intervention Title 19 follow up Psychosocial interventions 5 minutes spent on the phone with Danielle [name]. Danielle [name] reported that she has been working on the long term care Medicaid. She stated that she will bring a copy of the Medicaid application on Monday 05/12/2022. I thanked Danielle [name] for her assistance. Collaborations [employer] Specific referrals to enhance community supports (include existing and new resources) None Handoff Required? No Next Steps/Plan (including hand-off): I will remain involved for support as needed. Signature: Leim Fabry, LCSW Contact Information: [phone]

## 2022-05-10 LAB — BASIC METABOLIC PANEL
BKR ANION GAP: 9 (ref 7–17)
BKR BLOOD UREA NITROGEN: 10 mg/dL (ref 8–23)
BKR BUN / CREAT RATIO: 27 — ABNORMAL HIGH (ref 8.0–23.0)
BKR CALCIUM: 10.2 mg/dL (ref 8.8–10.2)
BKR CHLORIDE: 104 mmol/L (ref 98–107)
BKR CO2: 23 mmol/L (ref 20–30)
BKR CREATININE: 0.37 mg/dL — ABNORMAL LOW (ref 0.40–1.30)
BKR EGFR, CREATININE (CKD-EPI 2021): >60 mL/min/{1.73_m2} (ref >=60)
BKR GLUCOSE: 96 mg/dL (ref 70–100)
BKR POTASSIUM: 3.7 mmol/L (ref 3.3–5.3)
BKR SODIUM: 136 mmol/L (ref 136–144)

## 2022-05-10 LAB — CBC WITH AUTO DIFFERENTIAL
BKR WAM ABSOLUTE IMMATURE GRANULOCYTES.: 0.02 x 1000/ÂµL (ref 0.00–0.30)
BKR WAM ABSOLUTE LYMPHOCYTE COUNT.: 2.11 x 1000/ÂµL (ref 0.60–3.70)
BKR WAM ABSOLUTE NRBC (2 DEC): 0 x 1000/ÂµL (ref 0.00–1.00)
BKR WAM ANALYZER ANC: 3.29 x 1000/ÂµL (ref 2.00–7.60)
BKR WAM BASOPHIL ABSOLUTE COUNT.: 0.05 x 1000/ÂµL (ref 0.00–1.00)
BKR WAM BASOPHILS: 0.7 % (ref 0.0–1.4)
BKR WAM EOSINOPHIL ABSOLUTE COUNT.: 0.61 x 1000/ÂµL (ref 0.00–1.00)
BKR WAM EOSINOPHILS: 8.7 % — ABNORMAL HIGH (ref 0.0–5.0)
BKR WAM HEMATOCRIT (2 DEC): 32.7 % — ABNORMAL LOW (ref 35.00–45.00)
BKR WAM HEMOGLOBIN: 10.6 g/dL — ABNORMAL LOW (ref 11.7–15.5)
BKR WAM IMMATURE GRANULOCYTES: 0.3 % (ref 0.0–1.0)
BKR WAM LYMPHOCYTES: 30 % (ref 17.0–50.0)
BKR WAM MCH (PG): 26.8 pg — ABNORMAL LOW (ref 27.0–33.0)
BKR WAM MCHC: 32.4 g/dL (ref 31.0–36.0)
BKR WAM MCV: 82.8 fL (ref 80.0–100.0)
BKR WAM MONOCYTE ABSOLUTE COUNT.: 0.96 x 1000/ÂµL (ref 0.00–1.00)
BKR WAM MONOCYTES: 13.6 % — ABNORMAL HIGH (ref 4.0–12.0)
BKR WAM MPV: 9.9 fL (ref 8.0–12.0)
BKR WAM NEUTROPHILS: 46.7 % (ref 39.0–72.0)
BKR WAM NUCLEATED RED BLOOD CELLS: 0 % (ref 0.0–1.0)
BKR WAM PLATELETS: 419 x1000/ÂµL (ref 150–420)
BKR WAM RDW-CV: 15.4 % — ABNORMAL HIGH (ref 11.0–15.0)
BKR WAM RED BLOOD CELL COUNT.: 3.95 M/ÂµL — ABNORMAL LOW (ref 4.00–6.00)
BKR WAM WHITE BLOOD CELL COUNT: 7 x1000/ÂµL (ref 4.0–11.0)

## 2022-05-10 LAB — RHEUMATOID FACTOR: BKR RHEUMATOID FACTOR: 47.6 [IU]/mL — ABNORMAL HIGH (ref <14.0)

## 2022-05-10 LAB — C-REACTIVE PROTEIN     (CRP): BKR C-REACTIVE PROTEIN, HIGH SENSITIVITY: 33.5 mg/L — ABNORMAL HIGH

## 2022-05-10 LAB — SEDIMENTATION RATE (ESR): BKR SEDIMENTATION RATE, ERYTHROCYTE: 91 mm/h — ABNORMAL HIGH (ref 0–20)

## 2022-05-10 NOTE — Plan of Care
Plan of Care Overview/ Patient Status    Problem: Adult Inpatient Plan of CareGoal: Plan of Care ReviewOutcome: Interventions implemented as appropriateGoal: Patient-Specific Goal (Individualized)Outcome: Interventions implemented as appropriateGoal: Absence of Hospital-Acquired Illness or InjuryOutcome: Interventions implemented as appropriateGoal: Optimal Comfort and WellbeingOutcome: Interventions implemented as appropriate Patient medically cleared and awiting placement. Vss on room air.alert x2 not to time or situation. Sometimes confused on where she is but easily reoriented. Supplements given. Ostomy emptied. pain medication administered for hip and leg pain. Pain is well controlled with current regiment. Pure wick in place and attached to wall suction.

## 2022-05-10 NOTE — Progress Notes
[facility]	 [facility]	Medicine Progress NoteAttending Provider: Michaelle Copas, MDPCP: Pcp, Does Not Have AHospital Day: 88 Subjective: Interim History: Pt c/o left thigh and knee pain. She cannot extend her leg due to pain. Review of Allergies/Meds/Hx: I have reviewed the patient's: current scheduled medications and current prn medicationsCurrent Facility-Administered Medications Medication Dose Route Frequency Last Rate ? acetaminophen  975 mg Oral Q6H PRN   ? apixaban  5 mg Oral Q12H   ? banana flakes-t-galactooligos.  1 packet Oral BID   ? camphor-menthoL   Topical (Top) BID   ? dextrose (GLUCOSE) oral liquid 15 g  15 g Oral Q15 MIN PRN    Or ? fruit juice  120 mL Oral Q15 MIN PRN    Or ? skim milk  240 mL Oral Q15 MIN PRN   ? dextrose (GLUCOSE) oral liquid 30 g  30 g Oral Q15 MIN PRN    Or ? fruit juice  240 mL Oral Q15 MIN PRN   ? dextrose injection  12.5 g Intravenous Q15 MIN PRN   ? dextrose injection  25 g Intravenous Q15 MIN PRN   ? famotidine  20 mg Oral BID   ? ferrous gluconate  324 mg Oral BID WC   ? folic acid  1 mg Oral Daily   ? gabapentin  100 mg Oral BID PRN   ? gabapentin  100 mg Oral TID   ? glucagon  1 mg Intramuscular Once PRN   ? Lidocaine Patch  1 patch Transdermal Q24H   ? melatonin  3 mg Oral Nightly   ? niacin  100 mg Oral Nightly   ? nystatin  1 Application Topical (Top) BID   ? ondansetron  4 mg Oral Q6H PRN    Or ? ondansetron (ZOFRAN) IV Push  4 mg IV Push Q6H PRN   ? polyethylene glycol  17 g Oral DAILY PRN   ? pyridoxine (vitamin B6)  100 mg Oral Daily   ? rosuvastatin  10 mg Per G Tube Nightly   ? sodium chloride  3 mL IV Push Q8H   ? sodium chloride  3 mL IV Push PRN for Line Care   ? thiamine  100 mg Oral BID   ? traMADoL  50 mg Oral Q12H PRN   Objective: Vitals: Temp:  [97.6 ?F (36.4 ?C)-99 ?F (37.2 ?C)] 97.6 ?F (36.4 ?C)Pulse:  [73-97] 85Resp:  [18-20] 18BP: (110-126)/(74-80) 122/79SpO2:  [98 %-100 %] 100 %Device (Oxygen Therapy): room airDevice (Oxygen Therapy): room air Gross Totals (Last 24 hours) at 05/10/2022 1223Last data filed at 05/10/2022 1100Intake 680 ml Output 50 ml Net 630 ml Exam: GEN: Awake, pleasant, well-developed, thin F, in bed lying on her right side in NADHead: NC/ATEyes/mouth: Sclera nonicteric. MMM. Clear O/P.NECK: Supple, no LAD, no JVD.CV: RRR, normal S1/S2. No murmurs.CHEST: Normal effort. Normal rate. CTAB. No wheeze or crackles. On room air. ABD: Non-obese, non-distended. Bowel sounds. Soft. Non-tender.LE: Warm, no edemaSKIN: Dry, no rashes.NEURO: Alert and oriented to person, month & year, knew she was in hospital but did not know name of it.Labs:Recent Labs   05/20/230507 NA 136 K 3.7 CL 104 CO2 23 BUN 10 CREATININE 0.37* CALCIUM 10.2 Recent Labs   05/20/230507 WBC 7.0 HGB 10.6* HCT 32.70* PLT 419 No results for input(s): ALT, AST, ALKPHOS, BILIDIR, LABBILI, PROT, ALBUMIN in the last 72 hours.No results for input(s): LABPROT, INR, PTT in the last 72 hours.No results for input(s): TROPONINT, CKMB in the last 168  hours.Invalid input(s): CPK5/20/23 CRP 33.5, ESR 91, RF 47.6Micro: Diagnostics:05/10/22 CXR Impression: ? ? Findings: Right: There is no acute fracture or dislocation. There is diffuse osseous demineralization. Moderate degenerative changes are noted, greatest at the medial compartment. There is no discernible joint effusion. There is enthesophytosis along the extensor apparatus. Left: There is no acute fracture or dislocation. There is diffuse osseous demineralization. Moderate to advanced degenerative changes are noted with bone-on-bone appearance at the lateral compartment. There appears to be some bony remodeling along the lateral tibial plateau which could be a sequel of prior injury or degenerative change. There is enthesophytosis along the extensor apparatus. There is trace joint effusion. Ill-defined sclerotic foci in the distal femoral shaft are indeterminate in etiology but could represent a low-grade chondroid lesion or osteonecrosis.  Impression: ? ? Degenerative changes, as above.   ECG/Tele Events: No ECG ordered todayAssessment: 66 y.o. female with PMH of diverticular perforation, distal sigmoid colon stricture, s/p ex lap (R hemicolectomy, end ileostomy) bilateral DVTs, first admitted in Nov 2022 following which she had persistent encephalopathy neg work up. S/p abdominal abscess which was drained in Dec 2022, following a complicated course in which ethics was involved, she eventually had a G Tube placed for nutrition and went to a STR facility in [place] in Feb 2023, she was then brought back to [facility] as her sister was concerned about her ongoing encephalopathy, FTT, and requested she be admitted to [facility].This admission was complicated by dislodged G tube (replaced in Mar 2023), R foot cellulitis, hypercalcemia, DILI (from antibiotics) now awaiting conservatorship ?Decree (hearing 5/11) and LTC placement.  Pt now with LE pain, elev ESR/CRP/RF awaiting Rheum consult.Plan: Diffuse degenerative changes of b/l Knees, Elev RF and CRP/ESR- Attd consulted Rheumatology- cont Lidocaine patches- cont Tylenol 975 mg po q6h prn- cont Tramadol 50 mg po q12h prn- cont Gabapentin 100 mg po tidH/o DVTs- cont Apixiban 5 mg po bidHLD- cont Niacin 100 mg po qhs- cont Crestor 10 mg po qdAcute encephalopathy--improved- neg MRI brain, total spine, LP- cont supplements inc iron, FA, B6, B1, MelatoninGI ppx:- cont Pepcid 20 mg po bidFEN: TF with regular traySigned:Ezequiel Essex, PA-CMHB [phone] until 18:00pm 5/20/2023after 18:00pm Hospitalist coverage pager at [facility] is [phone] or at [facility] is [phone]/20/202312:23 [facility] Status Full Code DVT Prophylaxis Apixiban ADMIT MED REC done PCP Pcp, Does Not Have A None Emergency contact Extended Emergency Contact InformationPrimary Emergency Contact: McCulley,ReneeHome Phone: [phone] Disposition Pending conservatorship and LOTC

## 2022-05-10 NOTE — Plan of Care
Plan of Care Overview/ Patient Status    A/Ox 2, to self and place only, intermittent confusion. VSS, RA, incontinent x2, Purewick in place, ileostomy-322mL out this shift and burped. Pt on regular diet w/ thin liquids, tolerates well, but poor PO intake. Also has Jevity 1.5 through g-tube, however takes pills whole w/ water. Pt c/o intermittent BLE pain, PRN given, see MAR. Pt brother visited bedside and assisted at lunch. Full bed bath w/ gown and linen change. Pt has R lateral ankle foam dressing for prevention and Prevlon boots intermittently. Assist x1 in bed. Hourly rounding provided by staff, safety maintained, call light in reach, fall bundle in place.

## 2022-05-10 NOTE — Plan of Care
Plan of Care Overview/ Patient Status    A/Ox2, VSS, RA, changed ostomy bag, full bed bath given with linen and gown change. Pt c/o severe knee pain today, x-ray of knee yesterday in RESULTS tab, was also seen and evaluated by provider and team re; knee pain today. Pt is x1 assist in bed, safety maintained, hourly rounding, fall precautions in place.

## 2022-05-11 MED ORDER — PREDNISONE 20 MG TABLET
20 mg | Freq: Every day | ORAL | Status: CP
Start: 2022-05-11 — End: ?
  Administered 2022-05-12 – 2022-05-15 (×5): 20 mg via ORAL

## 2022-05-11 MED ORDER — LIDOCAINE 5 % ADHESIVE PATCH
5 % | TRANSDERMAL | Status: DC
Start: 2022-05-11 — End: 2022-05-29

## 2022-05-11 NOTE — Other
-  CONSULT  REQUEST  DOC[facility] NOTE-Type of consult: [facility] Rheumatology -New Consult: [id number] Danielle [name] /Location: 9830/9830-A / *Brief Clinical Question: h/o RA, c/o knee pain, has beenn off of mtx/Callback Cell Phone: [phone]** / Please confirm receipt of this message by texting back ?OK?-1 - Mobile Heartbeat message sent to [facility] at 8:20 AM. Received response at 0838.-Tinzley Dalia Hudd5/21/20238:20 [facility] [phone]

## 2022-05-11 NOTE — Plan of Care
Plan of Care Overview/ Patient Status    1900-0700-Alert to self and place, pleasant.Start of shift crying, c/o severe L knee pain, medicated with tramadol and tylenol with good effect, Pt reported pain had resolved. Incontinent of urine, Purewick in place. Incontinent care provided, new purewick placed. Mushy stool to ostomy. Specialty bed utilized.Bed alarm on, hourly rounding, Pt is free from falls.

## 2022-05-11 NOTE — Progress Notes
[facility]	 [facility]	Medicine Progress NoteAttending Provider: Dr. Levada Schilling: Interim History: No acute events overnight. VSS.  States she had a terrible night due to pain in her hips. Review of Allergies/Meds/Hx: I have reviewed the patient's: allergies, current scheduled medications, current infusions, current prn medications and past medical historyObjective: Vitals:I have reviewed the patient's current vital signs as documented in the patient's EMR.   and Last 24 hours: Temp:  [97.6 ?F (36.4 ?C)-97.7 ?F (36.5 ?C)] 97.7 ?F (36.5 ?C)Pulse:  [91-111] 91Resp:  [16-18] 16BP: (119-137)/(76-84) 126/81SpO2:  [97 %-98 %] 97 %I/O's:I have reviewed the patient's current I&O's as documented in the EMR.Gross Totals (Last 24 hours) at 05/11/2022 1335Last data filed at 05/11/2022 1000Intake 477 ml Output 400 ml Net 77 ml Procedures:None Physical Exam:Constitutional: Pleasant female laying in bed, NADHead: Normocephalic and atraumatic. Eyes: Pupils are equal, round.Neck: Normal range of motion.Cardiovascular: Normal rate, regular rhythm and normal heart sounds. Exam reveals no gallop and no friction rub. No murmur heard.Pulmonary/Chest: Effort normal and breath sounds normal. No stridor. No respiratory distress. No wheezes. Abdominal: Soft. Bowel sounds are normal. No distension. Musculoskeletal: Normal range of motion.   No tenderness or edema. Neurological: Alert and oriented to person, placeSkin: Skin is warm and dry. No rash noted. She is not diaphoretic. No erythema. Psychiatric: Normal mood and affect. Behavior is normal. Nursing note and vitals reviewed.Labs:Recent Labs Lab 05/15/230521 05/20/230507 NA 139 136 K 3.9 3.7 CL 105 104 CO2 25 23 BUN 10 10 CREATININE 0.34* 0.37* GLU 99 96 CALCIUM 10.1 10.2 Recent Labs Lab 05/15/230521 05/20/230507 WBC 6.2 7.0 HGB 10.7* 10.6* HCT 33.80* 32.70* PLT 324 419 MCV 86.9 82.8 NEUTROPHILS  --  46.7 MONOCYTES  --  13.6* No results for input(s): LABPROT, INR, PTT, DDIMER, FIBRINOGEN in the last 168 hours.No results for input(s): ALKPHOS, BILITOT, BILIDIR, PROT, ALT, AST in the last 168 hours.Invalid input(s): LABALBU No results for input(s): AMYLASE, LIPASE in the last 168 hours.No results for input(s): PHART, PCO2ART, PO2ART, O2SATART, BEART, HCO3ART in the last 168 hours.No results for input(s): TROPONINT, CKMB in the last 168 hours.Invalid input(s): CPKDiagnostics:No new radiology.ECG/Tele Events: No ECG ordered todayAssessment: Assessment:?66 y.o. female with PMH of diverticular perforation, distal sigmoid colon stricture, s/p ex lap (R hemicolectomy, end ileostomy) bilateral DVTs, first admitted in Nov 2022 following which she had persistent encephalopathy neg work up. S/p abdominal abscess which was drained in Dec 2022, following a complicated course in which ethics was involved, she eventually had a G Tube placed for nutrition and went to a STR facility in [place] in Feb 2023, she was then brought back to [facility] as her sister was concerned about her ongoing encephalopathy, FTT, and requested she be admitted to [facility].This admission was complicated by dislodged G tube (replaced in Mar 2023), R foot cellulitis, hypercalcemia, DILI (from antibiotics) now awaiting conservatorship ?Decree (hearing 5/11) and LTC placement.  Pt now with LE pain, elev ESR/CRP/RF awaiting Rheum consult.Diffuse degenerative changes of b/l Knees, Elev RF and CRP/ESR- Consulted Rheumatology- cont Lidocaine patches, added patches for bilateral hips- cont Tylenol 975 mg po q6h prn- cont Tramadol 50 mg po q12h prn- cont Gabapentin 100 mg po tid?H/o DVTs- cont Apixiban 5 mg po bid?HLD- cont Niacin 100 mg po qhs- cont Crestor 10 mg po qd?Acute encephalopathy--improved- neg MRI brain, total spine, LP- cont supplements inc iron, FA, B6, B1, Melatonin?GI ppx:- cont Pepcid 20 mg po bidDiet:TF with jevity 1.5 for 3.5 cartons dailyCode Status Full Code DVT Prophylaxis On eliquis  ADMIT MED REC Completed by pharmacist PCP Pcp, Does Not Have A None Emergency  Extended Emergency Contact InformationPrimary Emergency Contact: [facility] Phone: [phone] Disposition Pending conservatorship Signed:Katiria Calame [name] PAMHB: [phone] 5:00 PM please page [phone] for [facility] or [phone] for [facility]5/21/231:30 PM

## 2022-05-11 NOTE — Plan of Care
Plan of Care Overview/ Patient Status    Assumed care 0700-1900Neuro: A/O x 1 (pt not oriented to place, time, or situation) CV: no tele, pt has edema to lower extremities bilaterally (legs currently elevated) Resp: RA, diminishedGI/GU: R. Colostomy tube, pur wick changed this afternoon, pt tolerating well Skin: right colostomy, left mucous fistula Activity: bed bound, assist x 2, pt frequently turned/repositioned IV: L.PIV CDI/patent Pain: c/o pain to left knee, lidocaine patches/prn tylenol given with good effect Call bell in reach, safety maintained, bed in lowest position. Plan of care reviewed with patient.

## 2022-05-12 ENCOUNTER — Inpatient Hospital Stay: Admit: 2022-05-12 | Payer: PRIVATE HEALTH INSURANCE

## 2022-05-12 LAB — CBC WITH AUTO DIFFERENTIAL
BKR WAM ABSOLUTE IMMATURE GRANULOCYTES.: 0.01 x 1000/ÂµL (ref 0.00–0.30)
BKR WAM ABSOLUTE LYMPHOCYTE COUNT.: 1.72 x 1000/ÂµL (ref 0.60–3.70)
BKR WAM ABSOLUTE NRBC (2 DEC): 0 x 1000/ÂµL (ref 0.00–1.00)
BKR WAM ANALYZER ANC: 3.34 x 1000/ÂµL (ref 2.00–7.60)
BKR WAM BASOPHIL ABSOLUTE COUNT.: 0.06 x 1000/ÂµL (ref 0.00–1.00)
BKR WAM BASOPHILS: 0.9 % (ref 0.0–1.4)
BKR WAM EOSINOPHIL ABSOLUTE COUNT.: 0.6 x 1000/ÂµL (ref 0.00–1.00)
BKR WAM EOSINOPHILS: 9.1 % — ABNORMAL HIGH (ref 0.0–5.0)
BKR WAM HEMATOCRIT (2 DEC): 38.6 % (ref 35.00–45.00)
BKR WAM HEMOGLOBIN: 11.6 g/dL — ABNORMAL LOW (ref 11.7–15.5)
BKR WAM IMMATURE GRANULOCYTES: 0.2 % (ref 0.0–1.0)
BKR WAM LYMPHOCYTES: 26.2 % (ref 17.0–50.0)
BKR WAM MCH (PG): 26.2 pg — ABNORMAL LOW (ref 27.0–33.0)
BKR WAM MCHC: 30.1 g/dL — ABNORMAL LOW (ref 31.0–36.0)
BKR WAM MCV: 87.1 fL (ref 80.0–100.0)
BKR WAM MONOCYTE ABSOLUTE COUNT.: 0.84 x 1000/ÂµL (ref 0.00–1.00)
BKR WAM MONOCYTES: 12.8 % — ABNORMAL HIGH (ref 4.0–12.0)
BKR WAM MPV: 9.7 fL (ref 8.0–12.0)
BKR WAM NEUTROPHILS: 50.8 % (ref 39.0–72.0)
BKR WAM NUCLEATED RED BLOOD CELLS: 0 % (ref 0.0–1.0)
BKR WAM PLATELETS: 469 x1000/ÂµL — ABNORMAL HIGH (ref 150–420)
BKR WAM RDW-CV: 15.3 % — ABNORMAL HIGH (ref 11.0–15.0)
BKR WAM RED BLOOD CELL COUNT.: 4.43 M/ÂµL (ref 4.00–6.00)
BKR WAM WHITE BLOOD CELL COUNT: 6.6 x1000/ÂµL (ref 4.0–11.0)

## 2022-05-12 LAB — BASIC METABOLIC PANEL
BKR ANION GAP: 11 (ref 7–17)
BKR BLOOD UREA NITROGEN: 9 mg/dL (ref 8–23)
BKR BUN / CREAT RATIO: 28.1 — ABNORMAL HIGH (ref 8.0–23.0)
BKR CALCIUM: 10.6 mg/dL — ABNORMAL HIGH (ref 8.8–10.2)
BKR CHLORIDE: 103 mmol/L (ref 98–107)
BKR CO2: 23 mmol/L (ref 20–30)
BKR CREATININE: 0.32 mg/dL — ABNORMAL LOW (ref 0.40–1.30)
BKR EGFR, CREATININE (CKD-EPI 2021): >60 mL/min/{1.73_m2} (ref >=60)
BKR GLUCOSE: 105 mg/dL — ABNORMAL HIGH (ref 70–100)
BKR POTASSIUM: 4 mmol/L (ref 3.3–5.3)
BKR SODIUM: 137 mmol/L (ref 136–144)

## 2022-05-12 LAB — CK     (BH GH L LMW YH): BKR CREATINE KINASE TOTAL: 39 U/L (ref 11–204)

## 2022-05-12 MED ORDER — ENOXAPARIN 80 MG/0.8 ML SUBCUTANEOUS SYRINGE
80 mg/0.8 mL | Freq: Two times a day (BID) | SUBCUTANEOUS | Status: DC
Start: 2022-05-12 — End: 2022-08-15
  Administered 2022-05-12 – 2022-08-14 (×188): 80 mg/0.8 mL via SUBCUTANEOUS

## 2022-05-12 NOTE — Progress Notes
[facility] day Progress NoteAttending Provider: Michaelle Copas, MD Subjective                                                                              Subjective: Interim History:  She is complaining of bilateral lower extremity pain went denies nausea, vomiting, chest pain or dyspnea.Review of Allergies/Meds/Hx: Review of Allergies/Meds/Hx:I have reviewed the patient's: allergies and current scheduled medicationsScheduled Meds:Current Facility-Administered Medications Medication Dose Route Frequency Provider Last Rate Last Admin ? apixaban (ELIQUIS) tablet 5 mg  5 mg Oral Q12H Pryor Ochoa, MD   5 mg at 05/12/22 0846 ? Banatrol Plus oral powder packet  1 packet Oral BID Pryor Ochoa, MD   1 packet at 05/11/22 2223 ? camphor-menthoL (SARNA) lotion   Topical (Top) BID Manzon, Berkley Harvey, MD   Given at 05/12/22 [phone] ? famotidine (PEPCID) tablet 20 mg  20 mg Oral BID Pryor Ochoa, MD   20 mg at 05/12/22 0845 ? ferrous gluconate (FERGON) tablet 324 mg  324 mg Oral BID WC Pryor Ochoa, MD   324 mg at 05/12/22 1744 ? folic acid (FOLVITE) tablet 1 mg  1 mg Oral Daily Pryor Ochoa, MD   1 mg at 05/12/22 0846 ? gabapentin (NEURONTIN) capsule 100 mg  100 mg Oral TID Marlon Pel, MD   100 mg at 05/12/22 1319 ? lidocaine (LIDODERM) 5 % 1 patch  1 patch Transdermal Q24H Simeon Craft, MD   1 patch at 05/11/22 2221 ? lidocaine (LIDODERM) 5 % 2 patch  2 patch Transdermal Q24H Milton Ferguson, PA   2 patch at 05/12/22 1319 ? melatonin tablet 3 mg  3 mg Oral Nightly Pryor Ochoa, MD   3 mg at 05/11/22 2222 ? niacin Immediate Release tablet 100 mg  100 mg Oral Nightly Pryor Ochoa, MD   100 mg at 05/11/22 2225 ? nystatin (MYCOSTATIN) powder 1 Application  1 Application Topical (Top) BID Henriette Combs, MD   1 Application at 05/12/22 [phone] ? predniSONE (DELTASONE) tablet 20 mg  20 mg Oral Daily Britta Mccreedy, MD 20 mg at 05/12/22 0846 ? pyridoxine (vitamin B6) (B-6) tablet 100 mg  100 mg Oral Daily Pryor Ochoa, MD   100 mg at 05/12/22 0846 ? rosuvastatin (CRESTOR) tablet 10 mg  10 mg Per G Tube Nightly Dorisann Frames, MD   10 mg at 05/11/22 2221 ? sodium chloride 0.9 % flush 3 mL  3 mL IV Push Q8H Dorisann Frames, MD   3 mL at 05/12/22 1320 ? thiamine (VITAMIN B1) tablet 100 mg  100 mg Oral BID Pryor Ochoa, MD   100 mg at 05/12/22 0846 Continuous Infusions:PRN Meds:.acetaminophen, dextrose (GLUCOSE) oral liquid 15 g **OR** fruit juice **OR** skim milk, dextrose (GLUCOSE) oral liquid 30 g **OR** fruit juice, dextrose injection, dextrose injection, gabapentin, glucagon, ondansetron **OR** ondansetron (ZOFRAN) IV Push, polyethylene glycol, sodium chloride, traMADoL Objective Objective: Vitals:Last 24 hours: Temp:  [98 ?F (36.7 ?C)-99 ?F (37.2 ?C)] 98 ?F (36.7 ?C)Pulse:  [79-90] 79Resp:  [16-22] 18BP: (112-125)/(67-80) 112/67SpO2:  [97 %-100 %] 98 %I/O's:I have reviewed the patient's current I&O's as documented in the EMR.Procedures:None Physical Exam Constitutional:  comfortable, in no distressHead:  Normocephalic and atraumatic. Eyes: Pupils are equal, round.Neck: Normal range of motion.Cardiovascular: Normal rate, regular rhythm and normal heart sounds. Exam reveals no gallop and no friction rub. No murmur heard.Pulmonary/Chest: Effort normal and breath sounds normal. No stridor. No respiratory distress. No wheezes. Abdominal: Soft. Bowel sounds are normal. No distension. Musculoskeletal:  Range of motion is restricted unable to extend bilateral lower extremities due to knee pain bilaterally, right leg tenderness notedNeurological: Alert and oriented to person, placeSkin: Skin is warm and dry. No rash noted. She is not diaphoretic. No erythema. Psychiatric: Normal mood and affect. Behavior is normal. Labs:Last 24 hours: Recent Results (from the past 24 hour(s)) CK  Collection Time: 05/11/22 11:36 PM Result Value Ref Range  Total CK 39 11 - 204 U/L CBC auto differential  Collection Time: 05/11/22 11:36 PM Result Value Ref Range  WBC 6.6 4.0 - 11.0 x1000/?L  RBC 4.43 4.00 - 6.00 M/?L  Hemoglobin 11.6 (L) 11.7 - 15.5 g/dL  Hematocrit 16.10 96.04 - 45.00 %  MCV 87.1 80.0 - 100.0 fL  MCH 26.2 (L) 27.0 - 33.0 pg  MCHC 30.1 (L) 31.0 - 36.0 g/dL  RDW-CV 54.0 (H) 98.1 - 15.0 %  Platelets 469 (H) 150 - 420 x1000/?L  MPV 9.7 8.0 - 12.0 fL  Neutrophils 50.8 39.0 - 72.0 %  Lymphocytes 26.2 17.0 - 50.0 %  Monocytes 12.8 (H) 4.0 - 12.0 %  Eosinophils 9.1 (H) 0.0 - 5.0 %  Basophil 0.9 0.0 - 1.4 %  Immature Granulocytes 0.2 0.0 - 1.0 %  nRBC 0.0 0.0 - 1.0 %  ANC(Abs Neutrophil Count) 3.34 2.00 - 7.60 x 1000/?L  Absolute Lymphocyte Count 1.72 0.60 - 3.70 x 1000/?L  Monocyte Absolute Count 0.84 0.00 - 1.00 x 1000/?L  Eosinophil Absolute Count 0.60 0.00 - 1.00 x 1000/?L  Basophil Absolute Count 0.06 0.00 - 1.00 x 1000/?L  Absolute Immature Granulocyte Count 0.01 0.00 - 0.30 x 1000/?L  Absolute nRBC 0.00 0.00 - 1.00 x 1000/?L Basic metabolic panel  Collection Time: 05/11/22 11:36 PM Result Value Ref Range  Sodium 137 136 - 144 mmol/L  Potassium 4.0 3.3 - 5.3 mmol/L  Chloride 103 98 - 107 mmol/L  CO2 23 20 - 30 mmol/L  Anion Gap 11 7 - 17  Glucose 105 (H) 70 - 100 mg/dL  BUN 9 8 - 23 mg/dL  Creatinine 1.91 (L) 4.78 - 1.30 mg/dL  Calcium 29.5 (H) 8.8 - 10.2 mg/dL  BUN/Creatinine Ratio 62.1 (H) 8.0 - 23.0  eGFR (Creatinine) >60 >=60 mL/min/1.32m2 Diagnostics:Venous ultrasound 05/12/2022 showedNonocclusive acute deep venous thrombus is noted within the right popliteal vein and tibioperoneal trunkECG/Tele Events: No ECG ordered today Assessment Assessment: Assessment:66yo f history of RA, history of diverticular perforation, dist sigmoid colon stricture, s/p ex lap (R hemicolectomy, end ileostomy) bilateral DVTs, first admitted in Nov 2022 following which she had persistent encephalopathy with unrevealing work up. She also had abdo abscess which was drained in Dec 2022, following a complicated course in which ethics was involved, she eventually had a G Tube placed for nutrition and went to a STR facility in [place] in Feb 2023, she was then brought back to [facility] as her sister was concerned about her ongoing encephalopathy, FTT, and requested she be admitted to [facility].  During hospitalization patient has had extensive workup including MRI brain, MRI total spine, lumbar puncture, paraneoplastic autonomic encephalopathy, was evaluated by Neurology, recommended EMG to complete the workup in outpatient setting.  Given severe bilateral knee pain, hematology  consulted, she has history of rheumatoid arthritis. Plan Plan: Nonocclusive acute DVT:She has been on Eliquis 5 milligram b.i.d. venous ultrasound done in April showed no evidence of DVT.  Patient developed acute DVT, will switch her to Lovenox, will obtain Hematology consultation in a.m..Bilateral knee pain, history of rheumatoid arthritis:Bilateral knee x-ray shows evidence of degenerative disease.  Evaluated by Rheumatology, appreciate recommendations, she has been off methotrexate since November.Rheumatology recommended trial of prednisone 20 milligrams per day for 3 to 5 days followed by taper 5 milligram per day every 5 day taper, if she responds to prednisone will resume methotrexate, will repeat LFTs tomorrow.Acute encephalopathy:Improving. MRI brain, MRI total spine unremarkable- LP unremarkable- Paraneoplastic and autoimmune encephalopathy panels negative- Overall somewhat improved cognitive status during admission, remains alert and oriented x 1-2- Neurology rec'd EMG to complete work-up of LE weakness but pt declined; Neurology does not recommend any further testing and signed off 02/26/22- c/w Gabapentin 100 mg QHS -BLE US-no DVT?Perforated diverticulum:?-s/p R hemicolectomy, end ileostomy/LFT elevation-c/b fluid abscess requiring drainage during Nov 2022 admission in [place]. 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?on this admission?now [place] scan shows improved Peritoneal nodules?Per 3/30 notes, seems imaging was reviewed and that nodules were improving and thought to be resolution of continued debris.- Work-up showed elevated IgG, smooth muscle antibodies c/f possible autoimmune hepatitisInitial concern that imaging showed peritoneal nodules ?carcinomatosis- On review of imaging, nodules improving, more likely to be continued resolution of debris from perforation- Liver tests resolved without intervention, so most likely DILI from antibiotics (ceftriaxone and Zosyn)?IDA: Hematology consult &??signed off - elevated FLC ratio felt to be reactive, plasma cell disorder unlikely, continue iron supplementation?Dysphagia:Evaluated by speech therapy during this hospitalization, she is currently on modified diet, will continue G-tube feeds.??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  HLD:- cont Niacin 100 mg po qhs- cont Crestor 10 mg po qd??GI ppx:- cont Pepcid 20 mg po bidDiet:TF with jevity 1.5 for 3.5 cartons dailyDispo pending conservatorship and placement?Full Code??Electronically Signed:Dawit Tankard, MD Beeper [id number]/22/2023, 6:28 PM

## 2022-05-12 NOTE — Plan of Care
Plan of Care Overview/ Patient Status    Danielle [name]					Location: EP [id number]/9830-[id number] y.o., female				Attending: Michaelle Copas, MD	Admit Date: 02/11/2022			[id number] LOS: 90 days FOLLOW-UP NUTRITION ASSESSMENTDIET ORDER:  Dietary Orders (From admission, onward)     Start     Ordered  04/24/22 1446  Diet Tube Feed With Tray  (Diet Tube Feed Panel)  DIET EFFECTIVE NOW      Comments: JEVITY 1.5 @ 1.5 carton bolus @9am  and 1 carton BID (5pm, 9pm) to total 3.5 cartons daily Question Answer Comment Diet Selection? Regular  Safety Tray? (Suicide/Homicide/Police Custody) Safety Tray - No  Adult Tube Feed Formulas: Jevity 1.5 ([facility])  Feeding Route: G-Tube  Initiate Nutrition Management Protocol (Yes/No?) Yes - Initiate Protocol    04/24/22 1446    ANTHROPOMETRICS:Height: 70Admit wt:?79.4 kg (per SNF documentation.Current wt:?73.6 kg (5/14 bed) BMI:?23.29??(based on?current wt)?Additional wt information:Ht confirmed with pt.?Pt last known wt was 98.6 kg (12/13/2018).?Wt loss?confirmed with admit wt?as pt no longer weighs?98 kg. Unsure time frame of wt loss.?1-2+ edema?b/l LE?per flowsheets. Pt appears to be maintaining relatively stable weight with current feeding. ??Admit wt trends:?Date/Time Weight Weight Scale Used 05/09/22 0806 72.4 kg Bed 05/04/22 0547 73.6 kg In bed scale 05/03/22 1100 74.8 kg Bed 05/03/22 0810 74.8 kg Bed 04/30/22 0900 73.5 kg Bed 04/30/22 0833 73.7 kg Bed 04/26/22 0748 76.1 kg Bed 04/25/22 0847 76.7 kg Bed 04/24/22 0817 76.9 kg Bed 04/23/22 0837 76.9 kg Bed 04/22/22 0841 76 kg Bed 04/21/22 0849 68.6 kg Bed ?ESTIMATED NUTRITION REQUIREMENTS:Kcal/day:?1825??????????????      (25 ?kcal/kg)Protein/day:?73-87 ?????????????(1-1.2gm/kg)Fluids/day:?1825 mL/d?(64mL/kcal) ONLY as medical and fluid status allows and only per team discretion. Needs based on:?Dosing Wt (73?kg), maintenance, low dose steroid NUTRITION ASSESSMENT: Chart reviewed for follow up. Labs and meds reviewed, banatrol ordered BID. Per chart pt's ostomy was leaking this morning. Per chart pt is medically stable to transfer to STR as of 5/16. Per I/O's pt has received 61% of TF goal over the past week. Suspect pt has been receiving at least 90% of TF based on discussion writer had with pt's RN. RN reports pt is disoriented today. Met with pt at bedside. Pt reports she has a good appetite and is eating around 50% of each meal but reports they are small meals (a muffin or a sandwich with juice). Pt denied having any n/v and had no nutrition related questions. Per meal ordering history if pt was only eating 50% of trays pt is still be meeting 100% of needs with TF at goal intake. Nutrition will continue to follow. Cultural/Religious/Ethnic Needs:?Unknown?Food Allergies: NKFANUTRITION DIAGNOSIS:Severe malnutrition r/t depletion aeb moderate muscle and fat depletion.- continues?INTERVENTIONS/RECOMMENDATIONS: Meals and Snacks:-Continue with Regular trays-Encourage protein rich food sources with each tray ordered to better meet protein needsGoal:?po intake?for comfort given support provided by?goal?volume bolus feeds, continue Enteral Nutrition:?	Continue?JEVITY 1.5?@ 1.5?carton bolus @9am ?and?1 carton?BID (5pm,?9pm)?to total?3.5?cartons daily-	Provides:?876mL, 1244 kcal,?53gm protein,? free water-	Consider adding back in the 1.5 carton bolus at 1 pm if intake drops below 25%Goal: ?-	Patient will receive >90% of goal volume TF/ TPN prior to next nutrition assessment-?met, continue?Discharge Planning and Transfer of Nutrition Care:  Nutrition related discharge needs still being determined at this time, will continue to follow  MONITORING / EVALUATION:Food/Nutrition-Related History:Food and Nutrient IntakeEnteral and Parenteral Nutrition IntakeAnthropometric Measurements:Weight Biochemical Data, Medical Tests and Procedures:Electrolyte and renal profileGlucose/endocrine profileNutrition-Focus Physical FindingsOverall findingsElectronically signed by Genevive Bi, May 22, 2023Please note: Nutrition is a consult only service on weekends and holidays.  Please enter a consult in EPIC if assistance is needed on a weekend  or holiday or via MHB [phone] to contact the covering RD.

## 2022-05-12 NOTE — Plan of Care
Plan of Care Overview/ Patient Status    7AM-3PM: Pt is Alert to self and time. VS= BP 125/76  - Pulse 87  - Temp 98 ?F (36.7 ?C) (Oral)  - Resp 18  - Ht 5' 10 (1.778 m)  - Wt 72.4 kg  - SpO2 100% on RA. Reported BLE pain; prn tramadol given. LT 22g PIV; CDI. On a TF bolus 1.5 Jevity and reg diet. Takes pills whole w/ water. GI: RLQ ileostomy; CDI. GU: purewick in place/ incont. Skin: RLQ ileostomy; LT abd mucus fistula- bagin place. PEG site- dressing in place- CDI; coccyx is red- zinc applied. Ax2 in bed. Bed alarm on. T&Rq2. Bed at the lowest position. All safety measures in place. WCTM and follow POC. ileostomy emptied 2x. 213PM: pt left for BLE ultrasound. Problem: Adult Inpatient Plan of CareGoal: Plan of Care ReviewOutcome: Interventions implemented as appropriateGoal: Patient-Specific Goal (Individualized)Outcome: Interventions implemented as appropriateGoal: Absence of Hospital-Acquired Illness or InjuryOutcome: Interventions implemented as appropriateGoal: Optimal Comfort and WellbeingOutcome: Interventions implemented as appropriateGoal: Readiness for Transition of CareOutcome: Interventions implemented as appropriate Problem: InfectionGoal: Absence of Infection Signs and SymptomsOutcome: Interventions implemented as appropriate Problem: Physical Therapy GoalsGoal: Physical Therapy GoalsDescription: PT GOALS - addressed 05/02/22.1. Patient will demonstrate active range of motion x 4 extremities in preparation for mobility (GOAL MET)2. Caregivers will be independent and safely implement a range of motion/positioning program to prevent loss of range of motion and skin integrity (ONGOING)3. Patient will perform bed mobility with maximal assist (GOAL MET)4. Patient will tolerate sitting edge of bed >5 minutes with minimal assist (GOAL MET)5. Patient will perform transfers with moderate assist of 2 using rolling walker (ONGOING)6. Pt will perform bed mobility w/ modAx1 (ONGOING)7. Pt will perform STS transfer w/ modAx2 for a standing tolerance of 1 minute. (NEW) Outcome: Interventions implemented as appropriate Problem: Occupational Therapy GoalsGoal: Occupational Therapy GoalsDescription: OT goals1. Pt will move to EOB for seated ADL mod I2. Pt will follow 3/4 simple one step motor commands IND3. Pt will sequence through set up ADL task INDOutcome: Interventions implemented as appropriate Problem: Impaired Wound HealingGoal: Optimal Wound HealingOutcome: Interventions implemented as appropriate Problem: Fall Injury RiskGoal: Absence of Fall and Fall-Related InjuryOutcome: Interventions implemented as appropriate Problem: Skin Injury Risk IncreasedGoal: Skin Health and IntegrityOutcome: Interventions implemented as appropriate Problem: Mobility ImpairmentGoal: Optimal MobilityOutcome: Interventions implemented as appropriate Problem: Aspiration (Enteral Nutrition)Goal: Absence of Aspiration Signs and SymptomsOutcome: Interventions implemented as appropriate Problem: Device-Related Complication Risk (Enteral Nutrition)Goal: Safe, Effective Therapy DeliveryOutcome: Interventions implemented as appropriate Problem: Confusion AcuteGoal: Optimal Cognitive FunctionOutcome: Interventions implemented as appropriate Problem: Pain AcuteGoal: Acceptable Pain Control and Functional AbilityOutcome: Interventions implemented as appropriate Problem: Speech Language Pathology GoalsGoal: Dysphagia Goals, SLPDescription: Current goal: Patient will tolerate regular solids and thin liquids without adverse impact on respiratory status and while maintaining adequate oral nutrition.Prior goal (met): The patient will tolerate least restrictive diet without any decline in respiratory status.Prior goal (met): Pt will tolerate pureed diet with thin liquids w/o s/x of aspiration or adverse impact on respiratory status.Outcome: Interventions implemented as appropriate Problem: Self-Care DeficitGoal: Improved Ability to Complete Activities of Daily LivingOutcome: Interventions implemented as appropriate Problem: Fluid, Electrolyte and Nutrition Imbalance (Ileostomy)Goal: Fluid, Electrolyte and Nutrition BalanceOutcome: Interventions implemented as appropriate Problem: Infection (Ileostomy)Goal: Absence of Infection Signs and SymptomsOutcome: Interventions implemented as appropriate Problem: Skin or Soft Tissue InfectionGoal: Absence of Infection Signs and SymptomsOutcome: Interventions implemented as appropriate

## 2022-05-12 NOTE — Consults
[name]-YscRHEUMATOLOGY CONSULT SERVICE?History provided by: the patient and EMR reviewHistory limited by: patient/patient representative poor historian?Reason for Consult: Knee pain, h/o RA.?HPI: Danielle [name] is a 66 y.o. 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 Prior hx per initial consult note: 11/17/21: Admitted to [facility] for large volume pneumoperitoneum 2/2 to diverticular perforation and distal sigmoid colon stricture requiring ex-lap, extended R hemicolectomy, and end ileostomy (11/17/21). Hospital course was complicated by persistent AMS, attributed to metabolic encephalopathy. The patient was noted to have an extensive work-up with abdominal [place], head [place], and brain MRI only revealing pericolic gutter and a peripancreatitic fluid collection treated with antibiotics. Lab work was notable for a folic acid deficiency, which was treated folate supplementation. Repeat abdominal [place] on 12/18/2021 noted an abscess, which was subsequently drained. Persistent fluid collections were noted on repeat [place] imaging in January 2023. MRI imaging of the brain, C-Spine, and Thoracic-Spine were repeated with no new findings noted. During admission at [facility], the patient was noted to have bilateral DVTs, now on eliquis. After prolonged family and GOC discussion, PEG placement was performed on 01/24/22. Without noted improvement of encephalopathy, the patient was discharged to SNF at [name] on 02/05/22. The patient was then transferred to [facility] on 02/11/22 for evaluation for LTACH. Since coming to [facility], the patient was initially treated for concerns of persistent encephalopathy and infection with a 5 day course of zosyn. Transaminitis was notable on admission, though this has trended down while in hospital.  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 Oncology was recently consulted for further insight. 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 RUQ U/S showed no evidence of portal vein or hepatic vein thrombosis with increased liver echogenicity consistent with hepatic steatosis.   Psychiatry was consulted on 02/17/22 and does not believe this AMS is due to catatonia. LP performed on 2/28 was unremarkable. Neurology saw the patient and due to the previous extensive neurologic work-up, did not see any reason for further inpatient testing. Lumbar puncture was unrevealing, including flow cytometry. ?Pertinent Rheumatology Labs: ANA (1:160, speckled pattern), CRP elevated at 10.7, ESR elevated at 98, LD 232, C3/C4 WNL, IgG elevated at 3,235, RF pending, anti-CCP pending, Nuclear Antigen Abs pending, dsDNA Pending, cryoglobulin pending, and IFE positive for sharp band that may be suggestive of a cryoglobulin. XR Hips noted minimal degenerative changes of bilateral hip joints. ?Her rheumatologist is Dr. Zenovia Jordan in [facility]. The patient has a history of rheumatoid arthritis, but has not been on methotrexate for at least the past month. After discussion with sister, the sister notes diagnosis in 1984/1985. 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 Last seen by Rheum 3/2 and 3/7. Her MTX has been on hold d/t initial concerns for infection but never restarted. Rheumatology re-consulted for b/l knee pain, duration unclear, possibly 1-2 weeks per review of notes. Patient reports am stiffness <30 min in other joints (hands). Reports RA dx for 5 years and that she saw rheumatologist in [place] (Dr. Gwenlyn Fudge) but history not confirmed (RA dx since 21s and she followed in [place]). No signif pain in knees at rest but mostly when someone tries to extend L knee. R knee without signif pain. Past Medical History:- Bilateral LE DVT- Rheumatoid Arthritis- Large Volume Pneumoperitoneum 2/2 to diverticular perforation and distal sigmoid colon stricture - HyperlipidemiaPast Surgical History:- Extended R hemicolectomy and End Ileosteomy- Oophorectomy of the Left Ovary?Inpatient Medications:Scheduled Meds:Current Facility-Administered Medications Medication Dose Route Frequency Provider Last Rate Last Admin ? apixaban (ELIQUIS) tablet 5 mg  5 mg Oral Q12H Pryor Ochoa, MD   5 mg at 05/11/22 1116 ? Banatrol Plus oral powder packet  1 packet Oral BID Pryor Ochoa, MD   1 packet at 05/11/22 1116 ? camphor-menthoL (SARNA) lotion   Topical (Top) BID Manzon, Berkley Harvey, MD   Given at 05/10/22 2133 ? famotidine (PEPCID) tablet 20 mg  20 mg Oral BID Pryor Ochoa, MD   20 mg at 05/11/22 1116 ? ferrous gluconate (FERGON) tablet 324 mg  324 mg Oral BID WC Pryor Ochoa, MD   324 mg at 05/11/22 1116 ? folic acid (FOLVITE) tablet 1 mg  1 mg Oral Daily Pryor Ochoa, MD   1 mg at 05/11/22 1116 ? gabapentin (NEURONTIN) capsule 100 mg  100 mg Oral TID Marlon Pel, MD   100 mg at 05/11/22 1449 ? lidocaine (LIDODERM) 5 % 1 patch  1 patch Transdermal Q24H Simeon Craft, MD   1 patch at 05/10/22 2134 ? lidocaine (LIDODERM) 5 % 2 patch  2 patch Transdermal Q24H Milton Ferguson, PA   2 patch at 05/11/22 1449 ? melatonin tablet 3 mg  3 mg Oral Nightly Pryor Ochoa, MD   3 mg at 05/10/22 2130 ? niacin Immediate Release tablet 100 mg  100 mg Oral Nightly Pryor Ochoa, MD   100 mg at 05/10/22 2130 ? nystatin (MYCOSTATIN) powder 1 Application  1 Application Topical (Top) BID Henriette Combs, MD   1 Application at 05/10/22 2132 ? pyridoxine (vitamin B6) (B-6) tablet 100 mg  100 mg Oral Daily Pryor Ochoa, MD   100 mg at 05/11/22 1116 ? rosuvastatin (CRESTOR) tablet 10 mg  10 mg Per G Tube Nightly Dorisann Frames, MD   10 mg at 05/10/22 2130 ? sodium chloride 0.9 % flush 3 mL  3 mL IV Push Q8H Dorisann Frames, MD   3 mL at 05/10/22 2130 ? thiamine (VITAMIN B1) tablet 100 mg  100 mg Oral BID Pryor Ochoa, MD   100 mg at 05/11/22 1116 Continuous Infusions:PRN Meds:.acetaminophen, dextrose (GLUCOSE) oral liquid 15 g **OR** fruit juice **OR** skim milk, dextrose (GLUCOSE) oral liquid 30 g **OR** fruit juice, dextrose injection, dextrose injection, gabapentin, glucagon, ondansetron **OR** ondansetron (ZOFRAN) IV Push, polyethylene glycol, sodium chloride, traMADoLAllergies:Patient has no known allergies.?Family History:No family history on file.Social History:Samiyah  has no history on file for tobacco use, alcohol use, and drug use.?Review of Systems: (Significantly Limited due to Patient Condition)  Constitutional: Denied fevers, chills, night sweatsSkin: Denied rashes, alopeciaEyes: +dry eyes. ENT: +dry mouthCardiac: Denied chest painVascular: +Raynaud?s (white fingertips).Pulmonary: Denied shortness of breath, cough, hemoptysisGI: Denied  abdominal pain, N/V/D. GU: Unable to AssessMusculoskeletal: pain in L knee pain mainly on passive movements. ?Vitals:BP 116/77  - Pulse (!) 96  - Temp 97.6 ?F (36.4 ?C) (Oral)  - Resp 16  - Ht 5' 10 (1.778 m)  - Wt 72.4 kg  - SpO2 97%  - BMI 22.90 kg/m? Physical Examination:General: NAD, AAOx2 (knows she is in [place], but thought it was [facility] hospital).HEENT: no conjunctival injections; dry mouth; no oral lesions or exudates. Poor dentition with multiple front teeth missing. Neck: supple; no thyromegaly.  Cardiac: normal rate; regular rhythm; no MRGs.Lungs: moving air well; lungs clear bilaterally.Abdomen: patient deferred. Extremities: no lower extremity edema.Skin: warm and dry; no rashes or lesions.Neuro: AAOx2, pleasant. MSK: prominence of MCP with mild subluxation, no tenderness. Wrist with mild deformities, no effusion/warmth/tend. Slightly limited ROM in R shoulder on abduction/flexion (but can raise arm above her head). Knees -- signif deformities, L knee with possible small effusion, mild warmth, tender anteriorly (mild); flexed at 90 degrees, unable to extend d/t pain. R knee extends to 170 degrees w/o pain. Atrophy of quadriceps. Ankles/feet with mod swelling, minimal warmth of L lat ankle but no tenderness. MTP squeeze neg. Labs: Lab Results Component Value Date  WBC 7.0 05/10/2022  HGB 10.6 (L) 05/10/2022  HCT 32.70 (L) 05/10/2022  MCV 82.8 05/10/2022  MCH 26.8 (L) 05/10/2022  MCHC 32.4 05/10/2022  PLT 419 05/10/2022  MPV 9.9 05/10/2022  NEUTROPHILS 46.7 05/10/2022  LYMPHOCYTES 30.0 05/10/2022  MONOCYTES 13.6 (H) 05/10/2022  EOSINOPHILS 8.7 (H) 05/10/2022  Lab Results Component Value Date  NA 136 05/10/2022  K 3.7 05/10/2022  CL 104 05/10/2022  CO2 23 05/10/2022  GLU 96 05/10/2022  BUN 10 05/10/2022  CREATININE 0.37 (L) 05/10/2022  EGFRAFRAMER >60 12/13/2018  CALCIUM 10.2 05/10/2022  ALBUMIN 3.2 (L) 04/26/2022  PROT 8.0 04/26/2022  BILITOT 0.3 04/26/2022  ALKPHOS 76 04/26/2022  ALT 9 (L) 04/26/2022 AST 38 (H) 04/26/2022  GLOB 4.8 (H) 04/26/2022  Lab Results Component Value Date  HSCRP 33.5 (H) 05/10/2022  HSCRP 25.8 (H) 03/14/2022  HSCRP 10.7 (H) 02/19/2022  SEDRATE 91 (H) 05/10/2022  SEDRATE 98 (H) 02/19/2022 RF 22->475/19 x-ray of the knees bilateral Right: There is no acute fracture or dislocation. There is diffuse osseous demineralization. Moderate degenerative changes are noted, greatest at the medial compartment. There is no discernible joint effusion. There is enthesophytosis along the extensor apparatus.Left: There is no acute fracture or dislocation. There is diffuse osseous demineralization.Moderate to advanced degenerative changes are noted with bone-on-bone appearance at the lateral compartment. There appears to be some bony remodeling along the lateral tibial plateau which could be a sequel of prior injury or degenerative change. There is enthesophytosis along the extensor apparatus.There is trace joint effusion. Ill-defined sclerotic foci in the distal femoral shaft are indeterminate in etiology but could represent a low-grade chondroid lesion or osteonecrosis.?Assessment and Plan: Ms. Enslow is a 66 y.o. 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 She had prolonged hospital course and was recently appointed conservatorship, and pending placement to ECF. MTX has been on hold since admission (and even prior to it d/t abdominal infections). She now reports L>R knee pain mostly on movements. XR with advanced DJD. Mild  synovitis of L>R knee with minimal effusion and elevated CRP from prior (and chronically elevated ESR 90s) probably d/t RA activity; other joints w/o synovitis. ?Recommendations:Trial of prednisone 20 mg/d for 3-5 days, f/b taper (eg, by 5 mg/d every 5 days). If responds to prednisone, would resume MTX (although repeat LFT as AST was minimally elevated 2 weeks ago). Discussed with Dr. Elana Alm. Christ Kick, MDRheumatology fellow

## 2022-05-12 NOTE — Plan of Care
Plan of Care Overview/ Patient Status    1900-0700-Alert to self and place, pleasant. Ostomy leaking, changed appliance and provided head to toe bath. Abd /groin/breast folds improved barely perceivable redness visible, nystatin powder applied. Buttocks also improved, barrier cream applied.Linens and gown changed. Attempted to administer bannana chips to thicken stool, Pt does not like them, they triggered her gag reflex and she vomited. Ostomy leaked a second time, changed appliance again and it is holding. Stool is watery. VSS Tmax 99. Pt is resting comfortably. Bed alarm on, hourly rounding completed, Pt is free from falls.

## 2022-05-12 NOTE — Plan of Care
Plan of Care Overview/ Patient Status    Per DPR, patient complaining of knee pain. Plan is SNF LTC; per  [place], patient's sister declined their facility. CM sent additional referrals for review. CM will continue to follow and assist. Melina Copa, MSW, LCSWCare [phone]

## 2022-05-12 NOTE — Plan of Care
Inpatient Physical Therapy Progress Note IP Adult PT Eval/Treat - 05/12/22 1001    Date of Visit / Treatment  Date of Visit / Treatment 05/12/22   Note Type Progress Note   Progress Report Due 06/02/22   End Time 1001    General Information  Subjective You're always forcing me.   General Observations Pt received supine in bed; on RA; NAD; B ileostomies; purwick; RN cleared   Precautions/Limitations Fall Precautions;Bed alarm    Weight Bearing Status  Weight Bearing Status Comments WBAT BLE    Vital Signs and Orthostatic Vital Signs  Vital Signs Vital Signs Stable   Vital Signs Free text RA; NAD    Pain/Comfort  Pain Comment (Pre/Post Treatment Pain) No c/o pain pre session. Occasional facial grimace with touch and mobilization to BLE (R>L); RN aware and repositioned for comfort    Patient Coping  Observed Emotional State accepting;cooperative   Verbalized Emotional State acceptance;anger    Cognition  Overall Cognitive Status Impaired   Orientation Level Oriented to person;Oriented to time;Disoriented to place;Disoriented to situation   Level of Consciousness alert;confused   Following Commands Follows one step commands with increased time;Follows one step commands with repetition   Personal Safety / Judgment Fall risk;Requires supervision with mobility   Attention Requires redirection;Requires cues   Cognition Comments Pt confused and stating she left the hospital with Lurena Joiner last night? Redirection provided    Range of Motion  Range of Motion Comments WFL    Manual Muscle Testing  Manual Muscle Testing Comments + Deconditioned    Skin Assessment  Skin Assessment See Nursing Documentation    Balance  Balance Skills Training Comment Bed level d/t BLE discomfort and cogntion despite max Pt edu on benefit of mobilization    Bed Mobility  Bed Mobility Comments Pt refusing to mobilize to EOB depsite max Pt edu on benefits. PErformed PROM to BLE and repositioned    Handoff Documentation  Handoff Patient in bed;Bed alarm;Patient instructed to call nursing for mobility;Discussed with nursing   Handoff Comments B podus boots on    Endurance  Endurance Comments Poor +    PT- AM-PAC - Basic Mobility Screen- How much help from another person do you currently need.....  Turning from your back to your side while in a a flat bed without using rails? 1 - Total - Requires total assistance or cannot do it at all.   Moving from lying on your back to sitting on the side of a flat bed without using bed rails? 1 - Total - Requires total assistance or cannot do it at all.   Moving to and from a bed to a chair (including a wheelchair)? 1 - Total - Requires total assistance or cannot do it at all.   Standing up from a chair using your arms(e.g., wheelchair or bedside chair)? 1 - Total - Requires total assistance or cannot do it at all.   To walk in a hospital room? 1 - Total - Requires total assistance or cannot do it at all.   Climbing 3-5 steps with a railing? 1 - Total - Requires total assistance or cannot do it at all.   AMPAC Mobility Score 6   TARGET Highest Level of Mobility Mobility Level 2, Turn self in bed/bed activity/dependent transfer    ACTUAL Highest Level of Mobility Mobility Level 2, Turn self in bed/bed activity/ dependent transfer    Therapeutic Exercise  Therapeutic Exercise Comments PROM B hip flex/ext, abd/add; knee flex/ext; ankle DF/PF  Therapeutic Functional Activity  Therapeutic Functional Activity Comments Pt edu on benefits of mobilization; repositioning    Clinical Impression  Follow up Assessment Pt seen for PT f/u. Despite max Pt edu, Pt refused mobilization to EOB. Performed supine PROM to BLE and repositioned for comfort. Will benefit from skilled services to improve gross ROM, strength, postural control, and activity tolerance. Current PT d/c rec is SNF for LTC.   Criteria for Skilled Therapeutic Interventions Met yes    Patient/Family Stated Goals  Patient/Family Stated Goal(s) decrease pain;return to prior level of functioning    Frequency/Equipment Recommendations  PT Frequency 2x per week   What day of week is next treatment expected? Thursday   5/25  PT/PTA completing this assessment RL   Equipment Needs During Admission/Treatment No device    PT Recommendations for Inpatient Admission  Activity/Level of Assist dangle edge of bed;assist of 2    Planned Treatment / Interventions  Plan for Next Visit Progress as tol   Training Treatment / Interventions Balance / Investment banker, operational;Endurance Training;Functional Personnel officer / Interventions Patient Education / Training    PT Discharge Summary  Physical Therapy Disposition Recommendation Short Term Rehab     Shanon Payor PT, [place]: [phone]

## 2022-05-13 LAB — BASIC METABOLIC PANEL
BKR ANION GAP: 10 (ref 7–17)
BKR BLOOD UREA NITROGEN: 12 mg/dL (ref 8–23)
BKR BUN / CREAT RATIO: 38.7 — ABNORMAL HIGH (ref 8.0–23.0)
BKR CALCIUM: 10.7 mg/dL — ABNORMAL HIGH (ref 8.8–10.2)
BKR CHLORIDE: 105 mmol/L (ref 98–107)
BKR CO2: 23 mmol/L (ref 20–30)
BKR CREATININE: 0.31 mg/dL — ABNORMAL LOW (ref 0.40–1.30)
BKR EGFR, CREATININE (CKD-EPI 2021): >60 mL/min/{1.73_m2} (ref >=60)
BKR GLUCOSE: 91 mg/dL (ref 70–100)
BKR POTASSIUM: 4.9 mmol/L (ref 3.3–5.3)
BKR SODIUM: 138 mmol/L (ref 136–144)

## 2022-05-13 LAB — CBC WITH AUTO DIFFERENTIAL
BKR WAM ABSOLUTE IMMATURE GRANULOCYTES.: 0.01 x 1000/ÂµL (ref 0.00–0.30)
BKR WAM ABSOLUTE LYMPHOCYTE COUNT.: 2.58 x 1000/ÂµL (ref 0.60–3.70)
BKR WAM ABSOLUTE NRBC (2 DEC): 0 x 1000/ÂµL (ref 0.00–1.00)
BKR WAM ANALYZER ANC: 2.34 x 1000/ÂµL (ref 2.00–7.60)
BKR WAM BASOPHIL ABSOLUTE COUNT.: 0.04 x 1000/ÂµL (ref 0.00–1.00)
BKR WAM BASOPHILS: 0.7 % (ref 0.0–1.4)
BKR WAM EOSINOPHIL ABSOLUTE COUNT.: 0.19 x 1000/ÂµL (ref 0.00–1.00)
BKR WAM EOSINOPHILS: 3.3 % (ref 0.0–5.0)
BKR WAM HEMATOCRIT (2 DEC): 35.1 % (ref 35.00–45.00)
BKR WAM HEMOGLOBIN: 11.1 g/dL — ABNORMAL LOW (ref 11.7–15.5)
BKR WAM IMMATURE GRANULOCYTES: 0.2 % (ref 0.0–1.0)
BKR WAM LYMPHOCYTES: 44.8 % (ref 17.0–50.0)
BKR WAM MCH (PG): 27.3 pg (ref 27.0–33.0)
BKR WAM MCHC: 31.6 g/dL (ref 31.0–36.0)
BKR WAM MCV: 86.5 fL (ref 80.0–100.0)
BKR WAM MONOCYTE ABSOLUTE COUNT.: 0.6 x 1000/ÂµL (ref 0.00–1.00)
BKR WAM MONOCYTES: 10.4 % (ref 4.0–12.0)
BKR WAM MPV: 10.2 fL (ref 8.0–12.0)
BKR WAM NEUTROPHILS: 40.6 % (ref 39.0–72.0)
BKR WAM NUCLEATED RED BLOOD CELLS: 0 % (ref 0.0–1.0)
BKR WAM PLATELETS: 432 x1000/ÂµL — ABNORMAL HIGH (ref 150–420)
BKR WAM RDW-CV: 15.3 % — ABNORMAL HIGH (ref 11.0–15.0)
BKR WAM RED BLOOD CELL COUNT.: 4.06 M/ÂµL (ref 4.00–6.00)
BKR WAM WHITE BLOOD CELL COUNT: 5.8 x1000/ÂµL (ref 4.0–11.0)

## 2022-05-13 LAB — HEPATIC FUNCTION PANEL
BKR A/G RATIO: 0.7 — ABNORMAL LOW (ref 1.0–2.2)
BKR ALANINE AMINOTRANSFERASE (ALT): 14 U/L (ref 10–35)
BKR ALBUMIN: 3.6 g/dL (ref 3.6–4.9)
BKR ALKALINE PHOSPHATASE: 71 U/L (ref 9–122)
BKR ASPARTATE AMINOTRANSFERASE (AST): 21 U/L (ref 10–35)
BKR AST/ALT RATIO: 1.5
BKR BILIRUBIN DIRECT: <0.2 mg/dL (ref <=0.3)
BKR BILIRUBIN TOTAL: <0.2 mg/dL (ref <=1.2)
BKR GLOBULIN: 4.9 g/dL — ABNORMAL HIGH (ref 2.3–3.5)
BKR PROTEIN TOTAL: 8.5 g/dL (ref 6.6–8.7)

## 2022-05-13 NOTE — Plan of Care
Plan of Care Overview/ Patient Status    Patient reporting that prednisone is helping pain in legs. Ostomy emptied prn. Bed bath done today. Sister at bedside today. Lovenox administered as ordered. Call light within reach, bed alarm on, WCTM.Problem: Adult Inpatient Plan of CareGoal: Plan of Care ReviewOutcome: Interventions implemented as appropriate

## 2022-05-13 NOTE — Other
-  CONSULT  REQUEST  DOCUMENTATION-[facility] NOTE-Type of consult: [facility] Hematology -New Consult: [name] [name] /Location: 9830/9830-A / Brief Clinical Question: 51 f history of rheumatoid arthritis, DVT, has been on Eliquis developed DVT on Eliquis/Callback Cell Phone: [phone] / Please confirm receipt of this message by texting back ?OK?-1 - Mobile Heartbeat message sent to Delrae Alfred at 12:50 PM. Received response at 1250.-Laketha Leopard Dick5/23/202312:50 [facility] [phone]

## 2022-05-13 NOTE — Plan of Care
[facility]-YscSpiritual Care NotePurpose:   Observation: People present/  Emotional   Quality of Relational Support: Familiy In-Town and Involved     Subjective: Pt is being visited by a friend when chaplain arrived during rounding.   Pt appreciated visit as no other spiritual distress was identified and no pastoral care needed at this time.  I gave the availibility of our 24/7 day services in the event they are needed. Spiritual Assessment: Insurance underwriter of Relational Support: Familiy In-Town and Involved Religious Affiliation: [employer]  Spiritual Resources: [employer] Religious Practices: Faith Sharing   Spiritual Interventions:Spiritual Intervention Index**Date of Spiritual Visit: 05/23/23Visit Type: Initial VisitIntervention Type: Spiritual VisitResponding Chaplain: Unit ChaplainSpiritual/Religious Support Provided: Companionship, Introduction to [employer], Spiritual Support, HospitalityOutcome: OUTCOMES: Expressed Spiritual/Religious Resources or Distress: Expressed hope, Expressed gratitude OUTCOMES: Expressed Emotional Resources or Distress: Emotions Expressed/Distress Reduced OUTCOMES: Progressed Spiritually: Established chaplain relationship OUTCOMES: Progressed Emotionally or Physically: Increased comfort, Increased satisfaction     Plan: Spiritual Care remain available as needed.  If any of the following needs arise, please contact spiritual care: N: New diagnosisE: Emotional / spiritual distressE: Existential distressD: Decision making / goals of care meetingsS: Support for staff and patients' loved ones C: Compromised copingA: Anxiety / stress / grief / loneliness R: Religious / cultural / ritual needsE: End-of-life care / death / dying   Total Consult Time: 16 minutes	Signed: Chaplain:  Rev. Savas Elvin LawsonMobile Heart Beat [phone]: Mon-Fri. 8:00 am - 4:30 pm.  Please page the on-call chaplain 24/7 for immediate emotional/spiritual/religious support between visits using pager [phone]/23/20233:42 PM

## 2022-05-13 NOTE — Progress Notes
 [facility] day Progress NoteAttending Provider: Michaelle Copas, MD Subjective                                                                              Subjective: Interim History:  States bilateral knee pain [place] pain is improving.  Finding of venous ultrasound, DVT discussed with the patient.  Denies nausea, vomiting or abdominal painReview of Allergies/Meds/Hx: Review of Allergies/Meds/Hx:I have reviewed the patient's: allergies and current scheduled medicationsNo current facility-administered medications on file prior to encounter. Current Outpatient Medications on File Prior to Encounter Medication Sig Dispense Refill ? acetaminophen (TYLENOL) 160 mg/5 mL elixir 500 mg by Per G Tube route every 6 (six) hours as needed.   ? acetaminophen (TYLENOL) 500 mg tablet Give 2 tablets via PEG-tube every 6 hours as needed   ? apixaban (ELIQUIS) 5 mg tablet 1 tablet (5 mg total) by Per G Tube route every 12 (twelve) hours.   ? chlorhexidine gluconate (PERIDEX) 0.12 % solution Use as directed 15 mLs in the mouth or throat 2 (two) times daily.   ? Elemental iron 60 mg (300 mg Ferrous sulfate) / 5 mL oral liquid Give 3.7 mL by g-tube two times daily   ? folic acid (FOLVITE) 1 mg tablet 1 tablet (1 mg total) by Per G Tube route daily.   ? insulin lispro (ADMELOG, HUMALOG) 100 unit/mL vial Inject under the skin 3 (three) times daily before meals. Use per sliding scale.   ? niacin 50 mg Immediate Release tablet 2 tablets (100 mg total) by Per G Tube route at bedtime.   ? pantoprazole (PROTONIX) 40 mg GrPS oral suspension 1 packet (40 mg total) by Per G Tube route daily.   ? psyllium (METAMUCIL) packet Give 1 packet via g-tube two times daily   ? pyridoxine, vitamin B6, (VITAMIN B-6) 100 mg tablet 1 tablet (100 mg total) by Per G Tube route daily.   ? rosuvastatin (CRESTOR) 10 mg tablet 1 tablet (10 mg total) by Per G Tube route nightly.   ? therapeutic multivitamin liquid 15 mLs by Per G Tube route daily.   ? thiamine (VITAMIN B1) 100 mg tablet 1 tablet (100 mg total) by Per G Tube route 2 (two) times daily.    Objective Objective: Vitals:Last 24 hours: Temp:  [97.9 ?F (36.6 ?C)-98.9 ?F (37.2 ?C)] 97.9 ?F (36.6 ?C)Pulse:  [76-90] 78Resp:  [16-22] 18BP: (103-122)/(64-75) 122/75SpO2:  [98 %-99 %] 98 %I/O's:I have reviewed the patient's current I&O's as documented in the EMR.Procedures:None Physical Exam Constitutional:? comfortable, in no distressHead: Normocephalic and atraumatic. Eyes: Pupils are equal, round.Neck: Normal range of motion.Cardiovascular: Normal rate, regular rhythm and normal heart sounds. Exam reveals no gallop and no friction rub. No murmur heard.Pulmonary/Chest: Effort normal and breath sounds normal. No stridor. No respiratory distress. No wheezes. Abdominal: Soft. Bowel sounds are normal. No distension. Musculoskeletal:  Range of motion is restricted unable to extend bilateral lower extremities due to knee pain bilaterally, right leg tenderness notedNeurological: Alert and oriented to person, placeSkin: Skin is warm and dry. No rash noted. She is not diaphoretic. No erythema. Psychiatric: Normal mood and affect. Behavior is normal. Labs:Last 24 hours: Recent Results (from  the past 24 hour(s)) Basic metabolic panel  Collection Time: 05/13/22  5:30 AM Result Value Ref Range  Sodium 138 136 - 144 mmol/L  Potassium 4.9 3.3 - 5.3 mmol/L  Chloride 105 98 - 107 mmol/L  CO2 23 20 - 30 mmol/L  Anion Gap 10 7 - 17  Glucose 91 70 - 100 mg/dL  BUN 12 8 - 23 mg/dL  Creatinine 1.61 (L) 0.96 - 1.30 mg/dL  Calcium 04.5 (H) 8.8 - 10.2 mg/dL  BUN/Creatinine Ratio 40.9 (H) 8.0 - 23.0  eGFR (Creatinine) >60 >=60 mL/min/1.76m2 CBC auto differential  Collection Time: 05/13/22  5:30 AM Result Value Ref Range  WBC 5.8 4.0 - 11.0 x1000/?L RBC 4.06 4.00 - 6.00 M/?L  Hemoglobin 11.1 (L) 11.7 - 15.5 g/dL  Hematocrit 81.19 14.78 - 45.00 %  MCV 86.5 80.0 - 100.0 fL  MCH 27.3 27.0 - 33.0 pg  MCHC 31.6 31.0 - 36.0 g/dL  RDW-CV 29.5 (H) 62.1 - 15.0 %  Platelets 432 (H) 150 - 420 x1000/?L  MPV 10.2 8.0 - 12.0 fL  Neutrophils 40.6 39.0 - 72.0 %  Lymphocytes 44.8 17.0 - 50.0 %  Monocytes 10.4 4.0 - 12.0 %  Eosinophils 3.3 0.0 - 5.0 %  Basophil 0.7 0.0 - 1.4 %  Immature Granulocytes 0.2 0.0 - 1.0 %  nRBC 0.0 0.0 - 1.0 %  ANC(Abs Neutrophil Count) 2.34 2.00 - 7.60 x 1000/?L  Absolute Lymphocyte Count 2.58 0.60 - 3.70 x 1000/?L  Monocyte Absolute Count 0.60 0.00 - 1.00 x 1000/?L  Eosinophil Absolute Count 0.19 0.00 - 1.00 x 1000/?L  Basophil Absolute Count 0.04 0.00 - 1.00 x 1000/?L  Absolute Immature Granulocyte Count 0.01 0.00 - 0.30 x 1000/?L  Absolute nRBC 0.00 0.00 - 1.00 x 1000/?L Diagnostics:No new radiology.ECG/Tele Events: No ECG ordered today Assessment Assessment: Assessment:66yo f history of RA, history of diverticular perforation, dist sigmoid colon stricture, s/p ex lap (R hemicolectomy, end ileostomy) bilateral DVTs, first admitted in Nov 2022 following which she had persistent encephalopathy with unrevealing work up. She also had abdo abscess which was drained in Dec 2022, following a complicated course in which ethics was involved, she eventually had a G Tube placed for nutrition and went to a STR facility in [place] in Feb 2023, she was then brought back to [facility] as her sister was concerned about her ongoing encephalopathy, FTT, and requested she be admitted to [facility] ?During hospitalization patient has had extensive workup including MRI brain, MRI total spine, lumbar puncture, paraneoplastic autonomic encephalopathy, was evaluated by Neurology, recommended EMG to complete the workup in outpatient setting. ? Plan Plan: Nonocclusive acute DVT:She has been on Eliquis 5 milligram b.i.d. venous ultrasound done in April showed no evidence of DVT.  Given worsening bilateral lower extremity pain, venous ultrasound was obtained on 05/12/22 showed evidence of acute DVT she developed DVT despite being on Eliquis, hematology consulted, patient switch to Lovenox.?Bilateral knee pain,?history of rheumatoid arthritis:Bilateral knee x-ray shows evidence of degenerative disease.  Evaluated by Rheumatology, appreciate recommendations, she has been off methotrexate since November.Rheumatology recommended trial of prednisone 20 milligrams per day for 3 to 5 days followed by taper 5 milligram per day every 5 day taper, if she responds to prednisone will resume methotrexate, her pain is improving.  LFTs pending.??Acute encephalopathy:Improving. MRI brain, MRI total spine unremarkable- LP unremarkable- Paraneoplastic and autoimmune encephalopathy panels negative- Overall somewhat improved cognitive status during admission, remains alert and oriented x 1-2- Neurology rec'd EMG to complete work-up of LE  weakness but pt declined; Neurology does not recommend any further testing and signed off 02/26/22- c/w Gabapentin 100 mg QHS -BLE US-no DVT?Perforated diverticulum:?-s/p R hemicolectomy, end ileostomy/LFT elevation-c/b fluid abscess requiring drainage during Nov 2022 admission in [place]. 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?on this admission?now [place] scan shows improved Peritoneal nodules?Per 3/30 notes, seems imaging was reviewed and that nodules were improving and thought to be resolution of continued debris.- Work-up showed elevated IgG, smooth muscle antibodies c/f possible autoimmune hepatitisInitial concern that imaging showed peritoneal nodules ?carcinomatosis- On review of imaging, nodules improving, more likely to be continued resolution of debris from perforation- Liver tests resolved without intervention, so most likely DILI from antibiotics (ceftriaxone and Zosyn)?IDA: Hematology consulted &??signed off - elevated FLC ratio felt to be reactive, plasma cell disorder unlikely, continue iron supplementation?Dysphagia:Evaluated by speech therapy during this hospitalization, she is currently on modified diet, will continue G-tube feeds.??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 ?HLD:- cont Niacin 100 mg po qhs- cont Crestor 10 mg po qd??GI ppx:- cont Pepcid 20 mg po bidDVT:  On Lovenox 80 milligrams b.i.d..??Diet:TF with jevity 1.5 for 3.5 cartons daily?Dispo pending conservatorship and placement?Full Code Electronically Signed:Aliviah Spain, MD Beeper [id number]/23/2023, 12:38 PM

## 2022-05-13 NOTE — Care Coordination-Inpatient
Spoke with patient Danielle [name] sister Danielle [name] at the bedside regarding her sister T-19 app. [name] informed this Clinical research associate that she has submitted the application to the states ans is no awaiting there response. Danielle [name] has also informed this Clinical research associate that she is in agreement with sending out referrals in the [facility] for review. We are currently awaiting responses.Care management will continue to follow along with the patient's medical team as needed.Troy JonesTransition CoordinatorCare Management ([facility])Ph#[phone]#[phone]#[phone]

## 2022-05-13 NOTE — Plan of Care
Plan of Care Overview/ Patient Status    1900-0700 Alert to self, sometimes place, pleasant, cooperative. No c/o pain this shift. No acute events overnight. Ostomy +gas, +stool (flowsheet). Incontinent care PRN. Skin is  Improved, excoriation to buttocks, specialty bed utilized, cleansed with soap and water, barrier cream and nystatin powder. Purewick replaced foley care completed. 2X daily lovenox for DVT. Bed alarm on,hourly rounding completed. Pt is free from falls.

## 2022-05-14 LAB — MANUAL DIFFERENTIAL
BKR WAM BASOPHIL - ABS (DIFF) 2 DEC: 0.12 x 1000/ÂµL (ref 0.00–1.00)
BKR WAM BASOPHILS (DIFF): 1.8 % — ABNORMAL HIGH (ref 0.0–1.4)
BKR WAM EOSINOPHILS (DIFF) 2 DEC: 0.18 x 1000/ÂµL (ref 0.00–1.00)
BKR WAM EOSINOPHILS (DIFF): 2.8 % (ref 0.0–5.0)
BKR WAM LYMPHOCYTE - ABS (DIFF) 2 DEC: 1.82 x 1000/ÂµL (ref 0.60–3.70)
BKR WAM LYMPHOCYTES (DIFF): 28.4 % (ref 17.0–50.0)
BKR WAM MONOCYTE - ABS (DIFF) 2 DEC: 0.76 x 1000/ÂµL (ref 0.00–1.00)
BKR WAM MONOCYTES (DIFF): 11.9 % (ref 4.0–12.0)
BKR WAM NEUTROPHILS (DIFF): 55.1 % (ref 39.0–72.0)
BKR WAM NEUTROPHILS - ABS (DIFF) 2 DEC: 3.53 x 1000/ÂµL (ref 2.00–7.60)

## 2022-05-14 LAB — CBC WITH AUTO DIFFERENTIAL
BKR WAM ABSOLUTE NRBC (2 DEC): 0 x 1000/ÂµL (ref 0.00–1.00)
BKR WAM HEMATOCRIT (2 DEC): 31.7 % — ABNORMAL LOW (ref 35.00–45.00)
BKR WAM HEMOGLOBIN: 10.1 g/dL — ABNORMAL LOW (ref 11.7–15.5)
BKR WAM MCH (PG): 27.4 pg (ref 27.0–33.0)
BKR WAM MCHC: 31.9 g/dL (ref 31.0–36.0)
BKR WAM MCV: 85.9 fL (ref 80.0–100.0)
BKR WAM MPV: 10 fL (ref 8.0–12.0)
BKR WAM NUCLEATED RED BLOOD CELLS: 0 % (ref 0.0–1.0)
BKR WAM PLATELETS: 424 x1000/ÂµL — ABNORMAL HIGH (ref 150–420)
BKR WAM RDW-CV: 15.4 % — ABNORMAL HIGH (ref 11.0–15.0)
BKR WAM RED BLOOD CELL COUNT.: 3.69 M/ÂµL — ABNORMAL LOW (ref 4.00–6.00)
BKR WAM WHITE BLOOD CELL COUNT: 6.4 x1000/ÂµL (ref 4.0–11.0)

## 2022-05-14 LAB — BASIC METABOLIC PANEL
BKR ANION GAP: 8 (ref 7–17)
BKR BLOOD UREA NITROGEN: 16 mg/dL (ref 8–23)
BKR BUN / CREAT RATIO: 47.1 — ABNORMAL HIGH (ref 8.0–23.0)
BKR CALCIUM: 10.4 mg/dL — ABNORMAL HIGH (ref 8.8–10.2)
BKR CHLORIDE: 106 mmol/L (ref 98–107)
BKR CO2: 24 mmol/L (ref 20–30)
BKR CREATININE: 0.34 mg/dL — ABNORMAL LOW (ref 0.40–1.30)
BKR EGFR, CREATININE (CKD-EPI 2021): >60 mL/min/{1.73_m2} (ref >=60)
BKR GLUCOSE: 89 mg/dL (ref 70–100)
BKR POTASSIUM: 4.1 mmol/L (ref 3.3–5.3)
BKR SODIUM: 138 mmol/L (ref 136–144)

## 2022-05-14 MED ORDER — ZZ IMS TEMPLATE
Freq: Every day | ORAL | Status: CP
Start: 2022-05-14 — End: ?
  Administered 2022-05-16 – 2022-05-20 (×5): 1 mg via ORAL

## 2022-05-14 NOTE — Plan of Care
Plan of Care Overview/ Patient Status    0700-1900A&O: x4, sometimes forgetfull  Cardiac: no telemetry / VSS Resp: RAGI/GU: Incontinent, purewick in placed- incontinence care provided. 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 Ostomy care provided. Diet: TF bolus tid Jevity 1.5, plus regular tray diet. Skin: No active wounds,  (healed stage sacrum)Pain: Complained of B/L Lower Extremities pain 7/10. Pain management completed per doctor orders.  Mobility: No ambulatory, 2 person assist in bed. T&R Q2 completed.Access:PIV to her LUA saline lock, patent, dressing dry and intact. Consults:nonePT: following.Plan: T-19 FORM awaiting approval.DC Planning : to SNF LTCFall Risk Precautions including bed in the lowest position , bed alarm ON & call bell within reach maintained. Frequently rounding completed hourly. We will continue to monitor.

## 2022-05-14 NOTE — Plan of Care
Plan of Care Overview/ Patient Status    1900-0700:Assumed care at 1930. Pt is A&Ox2, disoriented to situation and time, reorientation provided as needed. Conserved. VSS on RA. LS clear and equal b/l. C/o BLE pain, PRN Tramadol given 1x with + effect. Lidocaine patch to L ankle. No other complaints/symptoms during this time. Regular diet. Swallows pills whole. All scheduled medcations administered per [facility]. L PIV intact. PEG in place and patent. TF Jevity1.5 bolus given per order. Banatrol administered via G-tube. Ileostomies intact; R +stool and flatus, L with no output. Purewick to suction collecting yellow urine. Skin intact except healing excoriation to bottom, skin barrier cream applied. 1+/2+ edema noted to BLE, heels offloaded in podus boots. Assist of 2 in bed, assisted to T&R q2h. Envision bed in use. Pt not ambulatory and resting in bed between care. No acute events overnight. Safety precautions maintained, bed alarm on, hourly rounding performed. See flowsheet for further details. Will continue to monitor. Problem: Adult Inpatient Plan of CareGoal: Plan of Care ReviewOutcome: Interventions implemented as appropriateGoal: Patient-Specific Goal (Individualized)Outcome: Interventions implemented as appropriateGoal: Absence of Hospital-Acquired Illness or InjuryOutcome: Interventions implemented as appropriateGoal: Optimal Comfort and WellbeingOutcome: Interventions implemented as appropriateGoal: Readiness for Transition of CareOutcome: Interventions implemented as appropriate

## 2022-05-15 LAB — CBC WITH AUTO DIFFERENTIAL
BKR WAM ABSOLUTE IMMATURE GRANULOCYTES.: 0.01 x 1000/ÂµL (ref 0.00–0.30)
BKR WAM ABSOLUTE LYMPHOCYTE COUNT.: 2.44 x 1000/ÂµL (ref 0.60–3.70)
BKR WAM ABSOLUTE NRBC (2 DEC): 0 x 1000/ÂµL (ref 0.00–1.00)
BKR WAM ANALYZER ANC: 2.33 x 1000/ÂµL (ref 2.00–7.60)
BKR WAM BASOPHIL ABSOLUTE COUNT.: 0.05 x 1000/ÂµL (ref 0.00–1.00)
BKR WAM BASOPHILS: 0.9 % (ref 0.0–1.4)
BKR WAM EOSINOPHIL ABSOLUTE COUNT.: 0.17 x 1000/ÂµL (ref 0.00–1.00)
BKR WAM EOSINOPHILS: 3 % (ref 0.0–5.0)
BKR WAM HEMATOCRIT (2 DEC): 34.7 % — ABNORMAL LOW (ref 35.00–45.00)
BKR WAM HEMOGLOBIN: 10.9 g/dL — ABNORMAL LOW (ref 11.7–15.5)
BKR WAM IMMATURE GRANULOCYTES: 0.2 % (ref 0.0–1.0)
BKR WAM LYMPHOCYTES: 43.6 % (ref 17.0–50.0)
BKR WAM MCH (PG): 26.1 pg — ABNORMAL LOW (ref 27.0–33.0)
BKR WAM MCHC: 31.4 g/dL (ref 31.0–36.0)
BKR WAM MCV: 83 fL (ref 80.0–100.0)
BKR WAM MONOCYTE ABSOLUTE COUNT.: 0.59 x 1000/ÂµL (ref 0.00–1.00)
BKR WAM MONOCYTES: 10.6 % (ref 4.0–12.0)
BKR WAM MPV: 9.6 fL (ref 8.0–12.0)
BKR WAM NEUTROPHILS: 41.7 % (ref 39.0–72.0)
BKR WAM NUCLEATED RED BLOOD CELLS: 0 % (ref 0.0–1.0)
BKR WAM PLATELETS: 410 x1000/ÂµL (ref 150–420)
BKR WAM RDW-CV: 15.4 % — ABNORMAL HIGH (ref 11.0–15.0)
BKR WAM RED BLOOD CELL COUNT.: 4.18 M/ÂµL (ref 4.00–6.00)
BKR WAM WHITE BLOOD CELL COUNT: 5.6 x1000/ÂµL (ref 4.0–11.0)

## 2022-05-15 LAB — BASIC METABOLIC PANEL
BKR ANION GAP: 10 (ref 7–17)
BKR BLOOD UREA NITROGEN: 12 mg/dL (ref 8–23)
BKR BUN / CREAT RATIO: 31.6 — ABNORMAL HIGH (ref 8.0–23.0)
BKR CALCIUM: 10.5 mg/dL — ABNORMAL HIGH (ref 8.8–10.2)
BKR CHLORIDE: 106 mmol/L (ref 98–107)
BKR CO2: 24 mmol/L (ref 20–30)
BKR CREATININE: 0.38 mg/dL — ABNORMAL LOW (ref 0.40–1.30)
BKR EGFR, CREATININE (CKD-EPI 2021): >60 mL/min/{1.73_m2} (ref >=60)
BKR GLUCOSE: 91 mg/dL (ref 70–100)
BKR POTASSIUM: 3.7 mmol/L (ref 3.3–5.3)
BKR SODIUM: 140 mmol/L (ref 136–144)

## 2022-05-15 LAB — PTH, INTACT WITHOUT CALCIUM: BKR PARATHYROID HORMONE INTACT: 40.7 pg/mL (ref 15.0–65.0)

## 2022-05-15 LAB — CALCIUM, IONIZED, WHOLE BLOOD (BH GH LMW YH): BKR CALCIUM, IONIZED, WHOLE BLOOD: 5.57 mg/dL — ABNORMAL HIGH (ref 4.60–5.08)

## 2022-05-15 LAB — VITAMIN D 25 (VITAMIN D STATUS)(MINMETABLAB)(YH): VITAMIN D 25-HYDROXY: 38 ng/mL (ref 20–50)

## 2022-05-15 MED ORDER — TRAMADOL (ULTRAM) 25 MG HALFTAB
25 mg | Freq: Two times a day (BID) | ORAL | Status: DC | PRN
Start: 2022-05-15 — End: 2022-06-30
  Administered 2022-05-16 – 2022-06-27 (×54): 25 mg via ORAL

## 2022-05-15 NOTE — Plan of Care
Plan of Care Overview/ Patient Status    Danielle [name]					Location: EP [id number] y.o., female				Attending: Michaelle Copas, MD	Admit Date: 02/11/2022			[name] LOS: 93 days FOLLOW-UP NUTRITION ASSESSMENTDIET ORDER:  Dietary Orders (From admission, onward)     Start     Ordered  04/24/22 1446  Diet Tube Feed With Tray  (Diet Tube Feed Panel)  DIET EFFECTIVE NOW      Comments: JEVITY 1.5 @ 1.5 carton bolus @9am  and 1 carton BID (5pm, 9pm) to total 3.5 cartons daily Question Answer Comment Diet Selection? Regular  Safety Tray? (Suicide/Homicide/Police Custody) Safety Tray - No  Adult Tube Feed Formulas: Jevity 1.5 ([facility])  Feeding Route: G-Tube  Initiate Nutrition Management Protocol (Yes/No?) Yes - Initiate Protocol    04/24/22 1446    ANTHROPOMETRICS:Height: 70Admit wt:?79.4 kg (per SNF documentation.)Current wt: 72.7 kg BMI: 23.00 Additional wt information:Ht confirmed with pt: yes 1-2+ edema noted per flowsheets. Slight wt decrease of 0.9 kg likely d/t fluid status or scale inaccuracies  noted since last nutrition assessment. Epic wt hx:Wt: 05/15/22 72.7 kg 12/13/18 98.6 kg ESTIMATED NUTRITION REQUIREMENTS:Kcal/day:?1825????????????????????(25 ?kcal/kg)Protein/day:?73-87 ?????????????(1-1.2gm/kg)Fluids/day:?1825 mL/d?(83mL/kcal) ONLY as medical and fluid status allows and only per team discretion. Needs based on:?Dosing Wt (73?kg), maintenance NUTRITION ASSESSMENT: Chart reviewed for follow up. Meds and labs reviewed. Since last nutrition assessment pt appears to have good po intake along with receiving 54% of TF goal over the past week . Met with pt at bedside who reports she is still eating only 2 trays a day at 50-100% intake depending on how much food is on the plate. Pt explained that if there is just a hamburger and juice then she will eat 100% of it. Pt endorses she has never eaten a lot and does not have the appetite to increase her intake at this time. Pt denied having any n/v/abd pain related to nutrition. Suspect pt is likely still meeting needs as recorded TF amount might not be accurate and may be higher and pt continues to have additional intake via her trays. Nutrition will continue to monitor for the remainder of admission. Cultural/Religious/Ethnic Needs:?Unknown?Food Allergies: NKFA?NUTRITION DIAGNOSIS:Severe malnutrition r/t depletion aeb moderate muscle and fat depletion.- continues??INTERVENTIONS/RECOMMENDATIONS: Meals and Snacks:-Continue with Regular trays-Encourage protein rich food sources with each tray ordered to better meet protein needsGoal:?po intake?for comfort given support provided by?goal?volume bolus feeds, -continues?Enteral Nutrition:?	Continue?JEVITY 1.5?@ 1.5?carton bolus @9am ?and?1 carton?BID (5pm,?9pm)?to total?3.5?cartons daily-	Provides:?882mL, 1244 kcal,?53gm protein,? free water-	Consider adding back in the 1.5 carton bolus at 1 pm if intake drops below 25%Goal: ?-	Patient will receive >90% of goal volume TF/ TPN prior to next nutrition assessment-?suspect met, continueDischarge Planning and Transfer of Nutrition Care:  Nutrition related discharge needs still being determined at this time, will continue to follow  MONITORING / EVALUATION:Food/Nutrition-Related History:Food and Nutrient IntakeEnteral and Parenteral Nutrition IntakeAnthropometric Measurements:Weight Biochemical Data, Medical Tests and Procedures:Electrolyte and renal profileGlucose/endocrine profileNutrition-Focus Physical FindingsOverall findingsElectronically signed by Genevive Bi, May 25, 2023Please note: Nutrition is a consult only service on weekends and holidays.  Please enter a consult in EPIC if assistance is needed on a weekend or holiday or via [facility] [phone] to contact the covering RD.

## 2022-05-15 NOTE — Plan of Care
Plan of Care Overview/ Patient Status    1900-0700:   Pt A&Ox3.   VSS on RA. Denies SOB, CP, N/V/D. Scheduled meds given per order. Ileostomy x2 in place.  Right sided Ileostomy +flatus and stool. Pt incontinent of urine, prn care provided. PEG tube in place, dressing changed this shift. Bolus TF given 1 carton of Jevity 1.5.  Good PO intake. BLE elevated, podus boots in place. T&Rq2h. Pt resting in bed. Hourly rounding. Safety precautions maintained, bed alarm on, call bell in reach, will ctm and follow nursing poc

## 2022-05-15 NOTE — Plan of Care
Inpatient Physical Therapy Progress Note IP Adult PT Eval/Treat - 05/15/22 1045    Date of Visit / Treatment  Date of Visit / Treatment 05/15/22   Note Type Progress Note   Progress Report Due 06/02/22   End Time 1045    General Information  Subjective I keep calling you Mark.   General Observations Pt received supine in bed; on RA; NAD; purwick; B ileostomies; RN cleared   Precautions/Limitations Fall Precautions;Bed alarm    Weight Bearing Status  Weight Bearing Status Comments WBAT BLE    Vital Signs and Orthostatic Vital Signs  Vital Signs Vital Signs Stable   Vital Signs Free text RA; NAD    Pain/Comfort  Pain Comment (Pre/Post Treatment Pain) Pt displays noticeable facial grimace and makes verbal remarks w/ mobilization/touch to BLE; RN aware and repositioned for comfort.    Patient Coping  Observed Emotional State accepting;cooperative   Verbalized Emotional State acceptance    Cognition  Overall Cognitive Status Impaired   Orientation Level Oriented X4   Requires cues and incr time  Level of Consciousness alert;confused   Following Commands Follows one step commands with increased time;Follows one step commands with repetition   Personal Safety / Judgment Fall risk;Requires supervision with mobility   Attention Requires redirection;Requires cues   Cognition Comments Confused at times and requires redirection and cues    Range of Motion  Range of Motion Comments WFL - decr B knee ext, B ankle DF, BUE elevation > 90 deg    Manual Muscle Testing  Manual Muscle Testing Comments + Deconditioned    Skin Assessment  Skin Assessment See Nursing Documentation    Posture, Head/Trunk Alignment  Posture, Head/Trunk Alignment Retropulsive;Right lateral lean    Balance  Sitting Balance: Static  POOR+    Minimal assist to maintain static position with no Assistive Device   Sitting Balance: Dynamic  POOR     Moves through 1/4 to 1/2 ROM range with moderate assist to right self   Balance Assist Device Hand held assist   Balance Skills Training Comment Ax1    Bed Mobility  Supine-to-Sit Independence/Assistance Level Maximum assist;Assist of 1   Supine-to-Sit Assist Device Head of bed elevated;Draw pad;Hand held assist   Sit-to-Supine Independence/Assistance Level Maximum assist;Assist of 1   Sit-to-Supine Assist Device Head of bed elevated;Hand held assist;Draw pad   Limitations decreased ability to use arms for pushing/pulling;decreased ability to use legs for bridging/pushing   Bed Mobility, Impairments ROM decreased;postural control impaired;strength decreased;impaired balance;pain   Bed Mobility Comments Retropulsive w/ R lateral lean seated EOB, required HHA to correct; Performed A/AAROM BLE seated EOB    Museum/gallery curator Comments NT d/t incr need for assist d/t weakness and unsteadiness    Handoff Documentation  Handoff Patient in bed;Bed alarm;Patient instructed to call nursing for mobility;Discussed with nursing   Handoff Comments RN aware of bloody site to R lower back from itching    Endurance  Endurance Comments Poor+    PT- AM-PAC - Basic Mobility Screen- How much help from another person do you currently need.....  Turning from your back to your side while in a a flat bed without using rails? 1 - Total - Requires total assistance or cannot do it at all.   Moving from lying on your back to sitting on the side of a flat bed without using bed rails? 1 - Total - Requires total assistance or cannot do it at all.   Moving to and  from a bed to a chair (including a wheelchair)? 1 - Total - Requires total assistance or cannot do it at all.   Standing up from a chair using your arms(e.g., wheelchair or bedside chair)? 1 - Total - Requires total assistance or cannot do it at all.   To walk in a hospital room? 1 - Total - Requires total assistance or cannot do it at all.   Climbing 3-5 steps with a railing? 1 - Total - Requires total assistance or cannot do it at all.   AMPAC Mobility Score 6   TARGET Highest Level of Mobility Mobility Level 2, Turn self in bed/bed activity/dependent transfer    ACTUAL Highest Level of Mobility Mobility Level 3, Sit on edge of bed    Therapeutic Exercise  Therapeutic Exercise Comments A/AAROM BLE seated EOB    Neuromuscular Re-education  Neuromuscular Re-education Comments Seated static/dynamic PC training    Therapeutic Functional Activity  Therapeutic Functional Activity Comments Bed mobility; Pt edu    Clinical Impression  Follow up Assessment Pt seen for PT f/u. Continues to require maxAx1 for bed mobility. Displays retropulsion and R lateral lean seated EOB requiring assist to prevent LOB. Performed BLE therex EOB with emphasis on incr ROM. Will benefit from skilled services to improve gross functional ROM/strength, postural control, and activity tolerance. Current PT d/c is SNF for LTC.   Criteria for Skilled Therapeutic Interventions Met yes    Patient/Family Stated Goals  Patient/Family Stated Goal(s) decrease pain;feel better    Frequency/Equipment Recommendations  PT Frequency 2x per week   What day of week is next treatment expected? Tuesday   5/30  PT/PTA completing this assessment RL   Equipment Needs During Admission/Treatment No device    PT Recommendations for Inpatient Admission  Activity/Level of Assist dangle edge of bed;assist of 2   Positioning chair position of the bed;reposition frequently;elevate heels   Therapeutic Exercise ROM as tolerated   Other/Comments Pillow between knees to prevent skin breakdown    Planned Treatment / Interventions  Plan for Next Visit Progress as tol   Training Treatment / Interventions Balance / Investment banker, operational;Endurance Training;Functional Personnel officer / Interventions Patient Education / Training    PT Discharge Summary  Physical Therapy Disposition Recommendation SNF for long term placement     Shanon Payor PT, DPTMHB: [phone]

## 2022-05-15 NOTE — Progress Notes
[facility] Medicine Progress NoteAttending Provider: Michaelle Copas, MD Subjective                                                                              Subjective: Interim History:  I evaluated her multiple times during the day, in the morning she complained of abdominal discomfort, later in the day she complained of bilateral ankle painReview of Allergies/Meds/Hx: Review of Allergies/Meds/Hx:I have reviewed the patient's: allergies and current scheduled medicationsScheduled Meds:Current Facility-Administered Medications Medication Dose Route Frequency Provider Last Rate Last Admin ? Banatrol Plus oral powder packet  1 packet Oral BID Pryor Ochoa, MD   1 packet at 05/14/22 [phone] ? camphor-menthoL (SARNA) lotion   Topical (Top) BID Manzon, Berkley Harvey, MD   Given at 05/14/22 0930 ? enoxaparin (LOVENOX) injection 80 mg  80 mg Subcutaneous Q12H Kaytelynn Scripter, MD   80 mg at 05/14/22 1752 ? famotidine (PEPCID) tablet 20 mg  20 mg Oral BID Pryor Ochoa, MD   20 mg at 05/14/22 9604 ? ferrous gluconate (FERGON) tablet 324 mg  324 mg Oral BID WC Pryor Ochoa, MD   324 mg at 05/14/22 1752 ? folic acid (FOLVITE) tablet 1 mg  1 mg Oral Daily Pryor Ochoa, MD   1 mg at 05/14/22 5409 ? gabapentin (NEURONTIN) capsule 100 mg  100 mg Oral TID Marlon Pel, MD   100 mg at 05/14/22 1426 ? lidocaine (LIDODERM) 5 % 1 patch  1 patch Transdermal Q24H Simeon Craft, MD   1 patch at 05/13/22 2200 ? lidocaine (LIDODERM) 5 % 2 patch  2 patch Transdermal Q24H Milton Ferguson, PA   2 patch at 05/14/22 1426 ? melatonin tablet 3 mg  3 mg Oral Nightly Pryor Ochoa, MD   3 mg at 05/13/22 2200 ? niacin Immediate Release tablet 100 mg  100 mg Oral Nightly Pryor Ochoa, MD   100 mg at 05/13/22 2200 ? nystatin (MYCOSTATIN) powder 1 Application  1 Application Topical (Top) BID Henriette Combs, MD   1 Application at 05/14/22 [phone] ? predniSONE (DELTASONE) tablet 20 mg  20 mg Oral Daily Britta Mccreedy, MD   20 mg at 05/14/22 1478 ? pyridoxine (vitamin B6) (B-6) tablet 100 mg  100 mg Oral Daily Pryor Ochoa, MD   100 mg at 05/14/22 2956 ? rosuvastatin (CRESTOR) tablet 10 mg  10 mg Per G Tube Nightly Dorisann Frames, MD   10 mg at 05/13/22 2200 ? sodium chloride 0.9 % flush 3 mL  3 mL IV Push Q8H Dorisann Frames, MD   3 mL at 05/14/22 1428 ? thiamine (VITAMIN B1) tablet 100 mg  100 mg Oral BID Pryor Ochoa, MD   100 mg at 05/14/22 2130 Continuous Infusions:PRN Meds:.acetaminophen, dextrose (GLUCOSE) oral liquid 15 g **OR** fruit juice **OR** skim milk, dextrose (GLUCOSE) oral liquid 30 g **OR** fruit juice, dextrose injection, dextrose injection, gabapentin, glucagon, ondansetron **OR** ondansetron (ZOFRAN) IV Push, polyethylene glycol, sodium chloride, traMADoL Objective Objective: Vitals:      and Last 24 hours: Temp:  [97.5 ?F (36.4 ?C)-98.6 ?F (37 ?C)] 97.6 ?F (36.4 ?C)Pulse:  [74-96] 81Resp:  [17-20] 20BP: (109-125)/(70-79) 125/79SpO2:  [98 %-100 %] 100 %I/O's:I have reviewed  the patient's current I&O's as documented in the EMR.Procedures:None Physical Exam Constitutional:??comfortable, in no distressHead: Normocephalic and atraumatic. Eyes: Pupils are equal, round.Neck: Normal range of motion.Cardiovascular: Normal rate, regular rhythm and normal heart sounds. Exam reveals no gallop and no friction rub. No murmur heard.Pulmonary/Chest: Effort normal and breath sounds normal. No stridor. No respiratory distress. No wheezes. Abdominal: Soft. Bowel sounds are normal. No distension. Musculoskeletal:??Range of motion is restricted unable to extend bilateral lower extremities due to knee pain bilaterally, right leg tenderness notedNeurological: Alert and oriented to person, placeSkin: Skin is warm and dry. No rash noted. She is not diaphoretic. No erythema. Psychiatric: Normal mood and affect. Behavior is normal. Labs:Last 24 hours: Recent Results (from the past 24 hour(s)) CBC auto differential  Collection Time: 05/14/22  4:50 AM Result Value Ref Range  WBC 6.4 4.0 - 11.0 x1000/?L  RBC 3.69 (L) 4.00 - 6.00 M/?L  Hemoglobin 10.1 (L) 11.7 - 15.5 g/dL  Hematocrit 16.10 (L) 96.04 - 45.00 %  MCV 85.9 80.0 - 100.0 fL  MCH 27.4 27.0 - 33.0 pg  MCHC 31.9 31.0 - 36.0 g/dL  RDW-CV 54.0 (H) 98.1 - 15.0 %  Platelets 424 (H) 150 - 420 x1000/?L  MPV 10.0 8.0 - 12.0 fL  nRBC 0.0 0.0 - 1.0 %  Absolute nRBC 0.00 0.00 - 1.00 x 1000/?L Basic metabolic panel  Collection Time: 05/14/22  4:50 AM Result Value Ref Range  Sodium 138 136 - 144 mmol/L  Potassium 4.1 3.3 - 5.3 mmol/L  Chloride 106 98 - 107 mmol/L  CO2 24 20 - 30 mmol/L  Anion Gap 8 7 - 17  Glucose 89 70 - 100 mg/dL  BUN 16 8 - 23 mg/dL  Creatinine 1.91 (L) 4.78 - 1.30 mg/dL  Calcium 29.5 (H) 8.8 - 10.2 mg/dL  BUN/Creatinine Ratio 62.1 (H) 8.0 - 23.0  eGFR (Creatinine) >60 >=60 mL/min/1.53m2 Manual Differential  Collection Time: 05/14/22  4:50 AM Result Value Ref Range  Neutrophils 55.1 39.0 - 72.0 %  Lymphocytes 28.4 17.0 - 50.0 %  Monocytes 11.9 4.0 - 12.0 %  Eosinophils 2.8 0.0 - 5.0 %  Basophil 1.8 (H) 0.0 - 1.4 %  Neutrophils Absolute 3.53 2.00 - 7.60 x 1000/?L  Lymphocyte Absolute 1.82 0.60 - 3.70 x 1000/?L  Monocyte Absolute Count 0.76 0.00 - 1.00 x 1000/?L  Eosinophil Absolute Count 0.18 0.00 - 1.00 x 1000/?L  Basophil Absolute Count 0.12 0.00 - 1.00 x 1000/?L  RBC Morphology Present   Tear Drop Cells 1+ (A) None  Echinocytosis 1+ (A) None Diagnostics:No new radiology.ECG/Tele Events: No ECG ordered today Assessment Assessment: Assessment:66yo f history of RA, history of diverticular perforation, dist sigmoid colon stricture, s/p ex lap (R hemicolectomy, end ileostomy) bilateral DVTs, first admitted in Nov 2022 following which she had persistent encephalopathy with unrevealing work up. She also had abdo abscess which was drained in Dec 2022, following a complicated course in which ethics was involved, she eventually had a G Tube placed for nutrition and went to a STR facility in [place] in Feb 2023, she was then brought back to [facility] as her sister was concerned about her ongoing encephalopathy, FTT, and requested she be admitted to [facility] ?During hospitalization patient has had extensive workup?including?MRI brain, MRI total spine, lumbar puncture, paraneoplastic autonomic encephalopathy, was evaluated by Neurology, recommended EMG to complete the workup in outpatient setting. ? Plan Plan: Nonocclusive acute DVT:She has been on Eliquis 5 milligram b.i.d. venous ultrasound done in April showed no evidence of  DVT.  Given worsening bilateral lower extremity pain, venous ultrasound was obtained on 05/12/22 showed evidence of acute DVT she developed DVT despite being on Eliquis, hematology consulted, patient switched  to Lovenox. ?Bilateral knee pain,?history of rheumatoid arthritis:Bilateral knee x-ray shows evidence of degenerative disease.??Evaluated by Rheumatology, appreciate recommendations, she has been off methotrexate since November.Rheumatology recommended trial of prednisone 20 milligrams per day for 3 to 5 days followed by taper 5 milligram per day every 5 day taper.Will discuss timing of resumption of methotrexate with rheumatology tomorrow.  Her pain has improved with steroids.??Acute encephalopathy:Improving.?MRI brain, MRI total spine unremarkable- LP unremarkable- Paraneoplastic and autoimmune encephalopathy panels negative- Overall somewhat improved cognitive status during admission, remains alert and oriented x 1-2- Neurology rec'd EMG to complete work-up of LE weakness but pt declined; Neurology does not recommend any further testing and signed off 02/26/22- c/w Gabapentin 100 mg QHS -BLE US-no DVT?Perforated diverticulum:?-s/p R hemicolectomy, end ileostomy/LFT elevation-c/b fluid abscess requiring drainage during Nov 2022 admission in [place]. 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?signed off - elevated FLC ratio felt to be reactive, plasma cell disorder unlikely, continue iron supplementation?Dysphagia:Evaluated by speech therapy during this hospitalization, she is currently on modified diet, will continue G-tube feeds.??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  Vitamin-D level and PTH.??HLD:- cont Niacin 100 mg po qhs- cont Crestor 10 mg po qd??GI ppx:- cont Pepcid 20 mg po bid?DVT:  On Lovenox 80 milligrams b.i.d..??Diet:TF with jevity 1.5 for 3.5 cartons daily?Dispo pending conservatorship and placement?Full Code? Electronically Signed:Katisha Shimizu, MD Beeper [id number]/24/2023, 7:55 PM

## 2022-05-15 NOTE — Progress Notes
Medicine Progress NoteAttending Provider: Harvie Heck: NEMGPatient seen & evaluated at 330p on 5/25/2023Subjective: Interim History: Tillie has pain in the tops of both feet, in her L ankle and in L knee. Pain is intermittent. No cardiorespiratory symptoms.Review of Allergies/Meds/Hx: I have reviewed the patient's: current scheduled medicationsReview of Orders: Orders reviewed in detail and updated as appropriate.Objective: Vitals:I have reviewed the patient's current vital signs as documented in the patient's EMR.  Physical Exam: Awake, alert, appropriate.Lungs CTA.Abdomen soft, NT.No warmth or erythema or joint effusion of knee.Labs:I have reviewed the patient's labs within the last 24 hrs. Increase in iCa noted; PTH not suppressedDiagnostics:ReviewedProblem List/Impression: 1.	LE DVT, on therapeutic AC2.	History of RA, oligoarticular pain, currently on steroid taper3.	Encephalopathy, improved4.	Hypercalcemia, likely related to inactivity and also some degree of autonomous PTH secretion given it is not adequately suppressed5.	Conservatorship/placementPlan: -Continue lovenox at current dose.-Continue prednisone 15 mg daily through 5/30, then go down to 10 mg pred x5 days, 5 mg pred x5 days, then stop.-Lidoderm +/- limited tramadol as needed.-Does not need daily labs. Will trend Ca intermittently.No current disposition.Signed:Chris Anapaula Severt, MDYale GIM Program in Hospital MedicineContact:MobileHeartbeat

## 2022-05-16 NOTE — Plan of Care
Plan of Care Overview/ Patient Status    2300-0700:Patient is A&O x3, disoriented to situation at times.VSS on RA.C/o mild BLE pain only with movement.Ileostomy x2 intact. R ileostomy with green liquid output and flatus. Offered to change ostomies but said No, it was already done yesterday or day before.Incontinent of urine- purewick utilized.On envision bed. Podus boots in place. T&RP every 2 hours with Ax2.PIV to L arm patent.Monitored hourly.Awaiting T19/LTC.Problem: Adult Inpatient Plan of CareGoal: Plan of Care ReviewOutcome: Interventions implemented as appropriateGoal: Patient-Specific Goal (Individualized)Outcome: Interventions implemented as appropriateGoal: Absence of Hospital-Acquired Illness or InjuryOutcome: Interventions implemented as appropriateGoal: Optimal Comfort and WellbeingOutcome: Interventions implemented as appropriateGoal: Readiness for Transition of CareOutcome: Interventions implemented as appropriate

## 2022-05-16 NOTE — Progress Notes
Medicine Progress NoteAttending Provider: Harvie Heck: NEMGPatient seen & evaluated at 1100a on 5/26/2023Subjective: Interim History: Danielle [name] still has knee discomfort, but not different than prior days. No GI or respiratory symptoms.Review of Allergies/Meds/Hx: I have reviewed the patient's: current scheduled medicationsReview of Orders: Orders reviewed in detail and updated as appropriate.Objective: Vitals:I have reviewed the patient's current vital signs as documented in the patient's EMR.  Physical Exam: Awake, appropriate.Lungs CTA.L knee without effusion, mild warmth.Labs:I have reviewed the patient's labs within the last 24 hrs.Diagnostics:Reviewed.Problem List/Impression: 1.	LE DVT, on therapeutic AC2.	History of RA, oligoarticular pain, currently on steroid taper3.	Encephalopathy, improved4.	Hypercalcemia, likely related to inactivity and also some degree of autonomous PTH secretion given it is not adequately suppressed5.	Conservatorship/placementPlan: -Continue lovenox at current dose.-Continue prednisone 15 mg daily through 5/30, then go down to 10 mg pred x5 days, 5 mg pred x5 days, then stop.-Lidoderm +/- limited tramadol as needed.-Does not need daily labs. Will trend Ca intermittently. Next check 5/27.?No current disposition.Signed:Chris Baby Stairs, MDY[facility]IM Program in Hospital MedicineContact:MobileHeartbeat

## 2022-05-16 NOTE — Plan of Care
Plan of Care Overview/ Patient Status    1900-2300:  Pt A&Ox3 d/o situation.  VSS on RA. Denies SOB, CP, N/V/D. Scheduled meds given per order. Ileostomy x2 in place.  Right sided Ileostomy +flatus and stool. Pt incontinent of urine, prn care provided Purewick in place. PEG tube in place, dressing changed this shift. Bolus TF given 1 carton of Jevity 1.5.  Good PO intake. BLE elevated, podus boots in place. T&Rq2h. Pt resting in bed. Hourly rounding. Safety precautions maintained, bed alarm on, call bell in reach, will ctm and follow nursing poc

## 2022-05-17 LAB — BASIC METABOLIC PANEL
BKR ANION GAP: 7 (ref 7–17)
BKR BLOOD UREA NITROGEN: 16 mg/dL (ref 8–23)
BKR BUN / CREAT RATIO: 42.1 — ABNORMAL HIGH (ref 8.0–23.0)
BKR CALCIUM: 10.1 mg/dL (ref 8.8–10.2)
BKR CHLORIDE: 108 mmol/L — ABNORMAL HIGH (ref 98–107)
BKR CO2: 26 mmol/L (ref 20–30)
BKR CREATININE: 0.38 mg/dL — ABNORMAL LOW (ref 0.40–1.30)
BKR EGFR, CREATININE (CKD-EPI 2021): >60 mL/min/{1.73_m2} (ref >=60)
BKR GLUCOSE: 88 mg/dL (ref 70–100)
BKR POTASSIUM: 3.9 mmol/L (ref 3.3–5.3)
BKR SODIUM: 141 mmol/L (ref 136–144)

## 2022-05-17 LAB — CALCIUM, IONIZED, WHOLE BLOOD (BH GH LMW YH): BKR CALCIUM, IONIZED, WHOLE BLOOD: 5.5 mg/dL — ABNORMAL HIGH (ref 4.60–5.08)

## 2022-05-17 MED ORDER — SODIUM CHLORIDE 0.9 % INTRAVENOUS SOLUTION
INTRAVENOUS | Status: AC
Start: 2022-05-17 — End: ?
  Administered 2022-05-17: 22:00:00 via INTRAVENOUS

## 2022-05-17 NOTE — Plan of Care
Plan of Care Overview/ Patient Status    0700-1900A&O:X4Cardiac: WDL Resp: WDLGI/GU: purwick in place. Ileostomy c/d/I Diet: TF jevity 1.5 given per orders & tray Skin: see flowsheets Pain: leg pain prn tramadol given x1Mobility: total Access: PIVPills: whole Safety maintained, call bell in reach, bed alarm set, rounding continued. q2 T&R

## 2022-05-17 NOTE — Plan of Care
Plan of Care Overview/ Patient Status    0700-1900A&O: x3 d/o situation Cardiac: WDL Resp: WDL room air, denies SOBGI/GU: ileostomy. Incontinent of urine, purwick in place.Diet: regular and tube feed. jevity 1.5 given per orders.Skin: see flowsheets Pain: bliateral leg pain see mar for interventions Mobility: total Access: PIV right AC c/d/i Pills: whole Safety maintained, call bell in reach, bed alarm set, rounding continued.

## 2022-05-17 NOTE — Plan of Care
Plan of Care Overview/ Patient Status    1900-0700 Alert and oriented x3. Disoriented to situation. VSS on RA. C/o BLE pain only with activity. Bolus feed given as ordered via PEG. Pt also on regular diet. Right ileostomy intact. Left mucus fistula ostomy pouch intact. Purewick in place. Incontinence care provided as needed. Turned and repositioned q2h. Safety maintained. Hourly rounding completed. See flowsheets for assessments.Problem: Adult Inpatient Plan of CareGoal: Plan of Care ReviewOutcome: Interventions implemented as appropriateFlowsheets (Taken 05/17/2022 0026)Progress: no changePlan of Care Reviewed With: patient

## 2022-05-17 NOTE — Progress Notes
[facility] HealthMedicine Progress NoteAdmission Date: 02/11/2022  2:56 AMHospital Day: 95Patient is co-managed with Aileen Pilot, MD I saw and examined the patient on 05/17/2022.Subjective: Patient resting on hospital bed just finished breakfast. Endorses knee discomfort that she states is the same. 10-point ROS reviewed and otherwise negative. Review of Allergies/Meds/Hx: Allergies: Patient has no known allergies.Medications: Current Facility-Administered Medications Medication Dose Route Frequency ? banana flakes-t-galactooligos.  1 packet Oral BID ? camphor-menthoL   Topical (Top) BID ? enoxaparin (LOVENOX) treatment  80 mg Subcutaneous Q12H ? famotidine  20 mg Oral BID ? ferrous gluconate  324 mg Oral BID WC ? folic acid  1 mg Oral Daily ? gabapentin  100 mg Oral TID ? Lidocaine Patch  1 patch Transdermal Q24H ? Lidocaine Patch  2 patch Transdermal Q24H ? melatonin  3 mg Oral Nightly ? niacin  100 mg Oral Nightly ? nystatin  1 Application Topical (Top) BID ? predniSONE  15 mg Oral Daily ? pyridoxine (vitamin B6)  100 mg Oral Daily ? rosuvastatin  10 mg Per G Tube Nightly ? sodium chloride  3 mL IV Push Q8H ? thiamine  100 mg Oral BID Objective: Vitals:Temp:  [97.4 ?F (36.3 ?C)-98.1 ?F (36.7 ?C)] 98 ?F (36.7 ?C)Pulse:  [62-91] 64Resp:  [17-18] 18BP: (114-127)/(76-80) 127/80SpO2:  [95 %-100 %] 95 %Device (Oxygen Therapy): room airI/O's:Gross Totals (Last 24 hours) at 05/17/2022 1303Last data filed at 05/17/2022 1100Intake 751 ml Output 1450 ml Net -699 ml Physical Exam:General Appearance: Calm and cooperative. In no apparent distress.Cardiac:  Rate regular.Respiratory:  Breathes comfortably on room air, Clear and equal breath sounds bilaterally. Symmetric lung expansion bilaterally.Abdomen:  Soft and non-tender to palpation.  Non-distended.  Extremities: Warm and well perfused. No edema or erythema. Neurological:  Alert. Answers most questions appropriately. Labs: Recent Labs Lab 05/23/230530 05/24/230450 05/25/230702 WBC 5.8 6.4 5.6 HGB 11.1* 10.1* 10.9* HCT 35.10 31.70* 34.70* PLT 432* 424* 410  Recent Labs Lab 05/21/232336 05/23/230530 05/25/230702 NEUTROPHILS 50.8 40.6 41.7  Recent Labs Lab 05/24/230450 05/25/230701 05/27/230559 NA 138 140 141 K 4.1 3.7 3.9 CL 106 106 108* CO2 24 24 26  BUN 16 12 16  CREATININE 0.34* 0.38* 0.38* GLU 89 91 88 ANIONGAP 8 10 7   Recent Labs Lab 05/24/230450 05/25/230701 05/27/230559 CALCIUM 10.4* 10.5* 10.1  Recent Labs Lab 05/23/231557 ALT 14 AST 21 ALKPHOS 71 BILITOT <0.2 BILIDIR <0.2  No results for input(s): PTT, LABPROT, INR in the last 168 hours. Assessment: Danielle [name] is a 66yo female with a history of RA, history of diverticular perforation, dist sigmoid colon stricture, s/p ex lap (R hemicolectomy, end ileostomy) bilateral DVTs, first admitted in Nov 2022 following which she had persistent encephalopathy with unrevealing work up. She also had abdo abscess which was drained in Dec 2022, following a complicated course in which ethics was involved, she eventually had a G Tube placed for nutrition and went to a STR facility in [place] in Feb 2023, she was then brought back to [facility] as her sister was concerned about her ongoing encephalopathy, FTT, and requested she be admitted to [facility]. ?During hospitalization patient has had extensive workup?including?MRI brain, MRI total spine, lumbar puncture, paraneoplastic autonomic encephalopathy, was evaluated by Neurology, recommended EMG to complete the workup in outpatient setting. Currently awaiting conservatorship/placement. Plan: Nonocclusive acute DVT:She has been on Eliquis 5 milligram b.i.d. venous ultrasound done in April showed no evidence of DVT.??Given worsening bilateral lower extremity pain, venous ultrasound was obtained on 05/12/22?showed evidence of acute DVT she  developed DVT despite being on Eliquis, Hematology consulted, patient switched to Lovenox.-Continue Lovenox 80mg  BID?Bilateral knee pain,?history of rheumatoid arthritis:Bilateral knee x-ray shows evidence of degenerative disease.??Evaluated by Rheumatology, appreciate recommendations, she has been off methotrexate since November.Rheumatology recommended trial of prednisone 20 milligrams per day for 3 to 5 days followed by taper 5 milligram per day every 5 day taper. Her pain has improved with steroids.Lidoderm patch with PRN Tramadol ?Acute encephalopathy:Improving.?MRI brain, MRI total spine unremarkable- LP unremarkable- Paraneoplastic and autoimmune encephalopathy panels negative- Overall somewhat improved cognitive status during admission, remains alert and oriented x 1-2- Neurology rec'd EMG to complete work-up of LE weakness but pt declined; Neurology does not recommend any further testing and signed off 02/26/22- c/w Gabapentin 100 mg QHS -BLE US-no DVT?Perforated diverticulum:?-s/p R hemicolectomy, end ileostomy/LFT elevation-c/b fluid abscess requiring drainage during Nov 2022 admission in [place]. 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&??signed off - elevated FLC ratio felt to be reactive, plasma cell disorder unlikely, continue iron supplementation?Dysphagia:Evaluated by speech therapy during this hospitalization, she is currently on modified diet, will continue G-tube feeds.?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 Ionized calcium 5.50 (5.57)  Will continue to trend. Possibly from inactivity and autonomous PTH secretion. Will give gentle IVF and recheck on 5/29Vitamin-D level 38 and PTH 40.7?HLD:- cont Niacin 100 mg po qhs- cont Crestor 10 mg po qd?GI ppx:- cont Pepcid 20 mg po bidDVT: ?On Lovenox 80 milligrams b.i.d..Diet:TF with jevity 1.5 for 3.5 cartons dailyDispo pending conservatorship and placement?Signed:Shamel Galyean APRN5/27/2023

## 2022-05-18 LAB — CBC WITH AUTO DIFFERENTIAL
BKR WAM ABSOLUTE IMMATURE GRANULOCYTES.: 0.01 x 1000/ÂµL (ref 0.00–0.30)
BKR WAM ABSOLUTE LYMPHOCYTE COUNT.: 2.7 x 1000/ÂµL (ref 0.60–3.70)
BKR WAM ABSOLUTE NRBC (2 DEC): 0 x 1000/ÂµL (ref 0.00–1.00)
BKR WAM ANALYZER ANC: 2.83 x 1000/ÂµL (ref 2.00–7.60)
BKR WAM BASOPHIL ABSOLUTE COUNT.: 0.05 x 1000/ÂµL (ref 0.00–1.00)
BKR WAM BASOPHILS: 0.8 % (ref 0.0–1.4)
BKR WAM EOSINOPHIL ABSOLUTE COUNT.: 0.18 x 1000/ÂµL (ref 0.00–1.00)
BKR WAM EOSINOPHILS: 2.8 % (ref 0.0–5.0)
BKR WAM HEMATOCRIT (2 DEC): 33.4 % — ABNORMAL LOW (ref 35.00–45.00)
BKR WAM HEMOGLOBIN: 10.4 g/dL — ABNORMAL LOW (ref 11.7–15.5)
BKR WAM IMMATURE GRANULOCYTES: 0.2 % (ref 0.0–1.0)
BKR WAM LYMPHOCYTES: 42 % (ref 17.0–50.0)
BKR WAM MCH (PG): 26.9 pg — ABNORMAL LOW (ref 27.0–33.0)
BKR WAM MCHC: 31.1 g/dL (ref 31.0–36.0)
BKR WAM MCV: 86.5 fL (ref 80.0–100.0)
BKR WAM MONOCYTE ABSOLUTE COUNT.: 0.66 x 1000/ÂµL (ref 0.00–1.00)
BKR WAM MONOCYTES: 10.3 % (ref 4.0–12.0)
BKR WAM MPV: 9.9 fL (ref 8.0–12.0)
BKR WAM NEUTROPHILS: 43.9 % (ref 39.0–72.0)
BKR WAM NUCLEATED RED BLOOD CELLS: 0 % (ref 0.0–1.0)
BKR WAM PLATELETS: 371 x1000/ÂµL (ref 150–420)
BKR WAM RDW-CV: 15.4 % — ABNORMAL HIGH (ref 11.0–15.0)
BKR WAM RED BLOOD CELL COUNT.: 3.86 M/ÂµL — ABNORMAL LOW (ref 4.00–6.00)
BKR WAM WHITE BLOOD CELL COUNT: 6.4 x1000/ÂµL (ref 4.0–11.0)

## 2022-05-18 LAB — BASIC METABOLIC PANEL
BKR ANION GAP: 8 (ref 7–17)
BKR BLOOD UREA NITROGEN: 12 mg/dL (ref 8–23)
BKR BUN / CREAT RATIO: 32.4 — ABNORMAL HIGH (ref 8.0–23.0)
BKR CALCIUM: 9.7 mg/dL (ref 8.8–10.2)
BKR CHLORIDE: 110 mmol/L — ABNORMAL HIGH (ref 98–107)
BKR CO2: 22 mmol/L (ref 20–30)
BKR CREATININE: 0.37 mg/dL — ABNORMAL LOW (ref 0.40–1.30)
BKR EGFR, CREATININE (CKD-EPI 2021): >60 mL/min/{1.73_m2} (ref >=60)
BKR GLUCOSE: 86 mg/dL (ref 70–100)
BKR POTASSIUM: 3.8 mmol/L (ref 3.3–5.3)
BKR SODIUM: 140 mmol/L (ref 136–144)

## 2022-05-18 NOTE — Plan of Care
Plan of Care Overview/ Patient Status    0700-1900A&O: x3 d/o situation Cardiac: WDL Resp: WDL room air, denies SOBGI/GU: ileostomy. Incontinent of urine, purwick in place.Diet: regular and tube feed. jevity 1.5 given per orders.Skin: see flowsheets Pain: bliateral leg pain see mar for interventions Mobility: total Access: PIV right AC c/d/i Pills: whole Safety maintained, call bell in reach, bed alarm set, rounding continued.

## 2022-05-18 NOTE — Progress Notes
[facility] Medicine Progress NoteAttending Provider: Aileen Pilot, MD / Ruthine Dose APRN Subjective                                                                              Subjective: Interim History: I have examined patient independently at bedside an d/w Dr. Ernestine Conrad POC.  No overnight events as per nursing.  Patient has no complaints today.  She denies any sob, n/v, fevers.Review of Allergies/Meds/Hx: Review of Allergies/Meds/Hx:I have reviewed the patient's: allergies, current scheduled medications, current infusions and current prn medications Objective Objective: Vitals:Most recent: Patient Vitals for the past 24 hrs: BP Temp Temp src Pulse Resp SpO2 05/18/22 0633 114/71 97.7 ?F (36.5 ?C) Oral 63 18 98 % 05/17/22 2303 117/68 98.2 ?F (36.8 ?C) Oral 60 18 99 % I/O's:Gross Totals (Last 24 hours) at 05/18/2022 1415Last data filed at 05/18/2022 0804Intake 989 ml Output 620 ml Net 369 ml Physical ExamConstitutional:     Appearance: Normal appearance. HENT:    Head: Normocephalic and atraumatic.    Mouth/Throat:    Mouth: Mucous membranes are moist. Cardiovascular:    Rate and Rhythm: Normal rate and regular rhythm.    Pulses: Normal pulses.    Heart sounds: Normal heart sounds. Pulmonary:    Effort: Pulmonary effort is normal.    Breath sounds: Normal breath sounds. Abdominal:    General: Abdomen is flat. Bowel sounds are normal. There is no distension.    Palpations: Abdomen is soft.    Tenderness: There is no abdominal tenderness. Musculoskeletal:    Right lower leg: No edema.    Left lower leg: No edema. Skin:   General: Skin is warm and dry. Neurological:    General: No focal deficit present.    Mental Status: She is alert and oriented to person, place, and time. Psychiatric:       Mood and Affect: Mood normal.  Labs:Last 8 hours: No results found for this or any previous visit (from the past 8 hour(s)). Assessment Assessment: 66yo female with a history of RA, history of diverticular perforation, dist sigmoid colon stricture, s/p ex lap (R hemicolectomy, end ileostomy) bilateral DVTs, first admitted in Nov 2022 following which she had persistent encephalopathy with unrevealing work up. She also had abdo abscess which was drained in Dec 2022, following a complicated course in which ethics was involved, she eventually had a G Tube placed for nutrition and went to a STR facility in [place] in Feb 2023, she was then brought back to [name] as her sister was concerned about her ongoing encephalopathy, FTT, and requested she be admitted to [facility]. ?During hospitalization patient has had extensive workup?including?MRI brain, MRI total spine, lumbar puncture, paraneoplastic autonomic encephalopathy, was evaluated by Neurology, recommended EMG to complete the workup in outpatient setting. Currently awaiting conservatorship/placement.  Plan Plan: Nonocclusive acute DVT- Patient had been on Eliquis 5mg  BID with venous u/s done in April showed no evidence of DVT.- Patient had worsening BLE pain and new u/s 5/22 showed acute DVT despite being on Eliaquis- Appreciate Hematology consult- DC Eliquis- Lovenox 80mg  BIDBilateral knee pain, hx of RA- xray showing DJD- Appreciate Rheumatology consult- Continue Prednisone taper and taper down by 5mg  every  3-5 days- Pain improved on steroids- Continue lidoderm patch and Tramadol prnAcute encephalopathy- MRI brain, MRI spine unremarkable- LP unremarkable- Paraneoplastic and autoimmune encephalopathy panels negative- Neurology recs EMG to complete w/u of LE weakness but patient declined, Neurology does not rec any further testing and signed off 3/8- Continue with gabapentinPerforated diverticulum / elevated abnormal LFTs- s/p right hemicolectomy with end ileostomy- c/b fluid abscess requiring drainage during Nov 2022 admission in 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

## 2022-05-18 NOTE — Plan of Care
Plan of Care Overview/ Patient Status    1900-0700 Alert and oriented x3. Disoriented to situation. VSS on RA. PRN tramadol given for BLE pain with good effect. IVF infused at 75 ml/hr overnight and stopped in the AM as ordered. Bolus feed given as ordered via PEG. Pt also on regular diet. Right ileostomy intact. Left mucus fistula ostomy pouch intact. Both appliances changed. Purewick in place. Incontinence care provided as needed. Awaiting T19/LTP. Turned and repositioned q2h. Safety maintained. Hourly rounding completed. See flowsheets for assessments.Problem: Adult Inpatient Plan of CareGoal: Plan of Care ReviewOutcome: Interventions implemented as appropriateFlowsheets (Taken 05/18/2022 0233)Progress: no changePlan of Care Reviewed With: patient

## 2022-05-18 NOTE — Care Coordination-Inpatient
Follow up call received from patients sister & conservator Danielle [name] informing this Clinical research associate that she has been in contact with [facility]. [name] informed me that she would like a referral sent to [facility] for review she also stated that she will be going to visit the facility after the [facility] Day holiday. A referral has been sent to [facility] for review.Danielle [name] states that she is currently working on securing patient Danielle [name] shared that she has completed the T-19 app and is now collecting the necessary financial documentation to submit to the state.Care management will continue to follow along with the patient's medical team as needed.Troy JonesTransition CoordinatorCare Management ([facility])Ph#[phone] Cell#[phone]#[phone]

## 2022-05-19 LAB — CALCIUM, IONIZED, WHOLE BLOOD (BH GH LMW YH): BKR CALCIUM, IONIZED, WHOLE BLOOD: 5.24 mg/dL — ABNORMAL HIGH (ref 4.60–5.08)

## 2022-05-19 NOTE — Plan of Care
Plan of Care Overview/ Patient Status    0700-1500: A/o x3, disoriented to situation at times, VSS on room air. C/o b/l foot pain, prn gabapentin & tylenol given with some effect. Denies SOB/chest pain. Colostomy in place, both CDI, purewick in place, incontinence provided as needed. Peg tube patent, dressing changed this shift, 9 AM jevity bolus feed given. B/l lower extremity edema present, heels offloaded, please see flowsheet for full skin assessment. Pills whole. Assist x 2 in bed. Hourly rounding completed, safety maintained. Call bell within reach.

## 2022-05-19 NOTE — Plan of Care
Plan of Care Overview/ Patient Status    1900-0700Pt is A&Ox3, disoriented to situation on RA. Pt complained of leg pain PRN Tramadol given with great effect. Pt denies any SOB. Pt takes pills whole. Pt is on a regular diet with bolus tube feeds of Jevity 1.5. Pt is incontinent of bladder, purewick in place draining yellow urine. Ileostomy in place draining brown stool. Hourly rounding completed, safety maintained, will continue to follow POC.

## 2022-05-19 NOTE — Plan of Care
Plan of Care Overview/ Patient Status    1500-1900[name]&Ox2, vss. No complaints of pain this shift. PO intake fair. TF administered as ordered. T&P q 2 hrs , heels off loaded. Safety maintained, bed alarm on, call bell in reach.

## 2022-05-19 NOTE — Progress Notes
[facility] Medicine Progress NoteAttending Provider: Aileen Pilot, MD / Ruthine Dose APRN Subjective                                                                              Subjective: Interim History: I have examined patient independently at bedside an d/w Dr. Ernestine Conrad POC.  No overnight events as per nursing.  Patient has no complaints today.  She denies any sob, n/v, fevers.Review of Allergies/Meds/Hx: Review of Allergies/Meds/Hx:I have reviewed the patient's: allergies, current scheduled medications, current infusions and current prn medications Objective Objective: Vitals:Most recent: Patient Vitals for the past 24 hrs: BP Temp Temp src Pulse Resp SpO2 05/19/22 0836 103/66 98.4 ?F (36.9 ?C) Oral 72 16 100 % 05/18/22 2357 110/67 97.8 ?F (36.6 ?C) Oral 85 18 97 % I/O's:Gross Totals (Last 24 hours) at 05/19/2022 1339Last data filed at 05/19/2022 0900Intake 937 ml Output 200 ml Net 737 ml Physical ExamConstitutional:     Appearance: Normal appearance. HENT:    Head: Normocephalic and atraumatic.    Mouth/Throat:    Mouth: Mucous membranes are moist. Cardiovascular:    Rate and Rhythm: Normal rate and regular rhythm.    Pulses: Normal pulses.    Heart sounds: Normal heart sounds. Pulmonary:    Effort: Pulmonary effort is normal.    Breath sounds: Normal breath sounds. Abdominal:    General: Abdomen is flat. Bowel sounds are normal. There is no distension.    Palpations: Abdomen is soft.    Tenderness: There is no abdominal tenderness. Musculoskeletal:    Right lower leg: No edema.    Left lower leg: No edema. Skin:   General: Skin is warm and dry. Neurological:    General: No focal deficit present.    Mental Status: She is alert and oriented to person, place, and time. Psychiatric:       Mood and Affect: Mood normal.  Labs:Last 8 hours: No results found for this or any previous visit (from the past 8 hour(s)). Assessment Assessment: 66yo female with a history of RA, history of diverticular perforation, dist sigmoid colon stricture, s/p ex lap (R hemicolectomy, end ileostomy) bilateral DVTs, first admitted in Nov 2022 following which she had persistent encephalopathy with unrevealing work up. She also had abdo abscess which was drained in Dec 2022, following a complicated course in which ethics was involved, she eventually had a G Tube placed for nutrition and went to a STR facility in [place] in Feb 2023, she was then brought back to [facility] as her sister was concerned about her ongoing encephalopathy, FTT, and requested she be admitted to [facility]. ?During hospitalization patient has had extensive workup?including?MRI brain, MRI total spine, lumbar puncture, paraneoplastic autonomic encephalopathy, was evaluated by Neurology, recommended EMG to complete the workup in outpatient setting. Currently awaiting conservatorship/placement.  Plan Plan: Nonocclusive acute DVT- Patient had been on Eliquis 5mg  BID with venous u/s done in April showed no evidence of DVT.- Patient had worsening BLE pain and new u/s 5/22 showed acute DVT despite being on Eliquis - Appreciate Hematology consult- DC Eliquis- Lovenox 80mg  BIDBilateral knee pain, hx of RA- xray showing DJD- Appreciate Rheumatology consult- Continue Prednisone taper and taper down by 5mg   every 3-5 days- Pain improved on steroids- Continue lidoderm patch and Tramadol prnAcute encephalopathy- MRI brain, MRI spine unremarkable- LP unremarkable- Paraneoplastic and autoimmune encephalopathy panels negative- Neurology recs EMG to complete w/u of LE weakness but patient declined, Neurology does not rec any further testing and signed off 3/8- Continue with gabapentinPerforated diverticulum / elevated abnormal LFTs- s/p right hemicolectomy with end ileostomy- c/b fluid abscess requiring drainage during Nov 2022 admission in 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

## 2022-05-20 LAB — CBC WITH AUTO DIFFERENTIAL
BKR WAM ABSOLUTE IMMATURE GRANULOCYTES.: 0.02 x 1000/ÂµL (ref 0.00–0.30)
BKR WAM ABSOLUTE LYMPHOCYTE COUNT.: 2.68 x 1000/ÂµL (ref 0.60–3.70)
BKR WAM ABSOLUTE NRBC (2 DEC): 0 x 1000/ÂµL (ref 0.00–1.00)
BKR WAM ANALYZER ANC: 3.85 x 1000/ÂµL (ref 2.00–7.60)
BKR WAM BASOPHIL ABSOLUTE COUNT.: 0.04 x 1000/ÂµL (ref 0.00–1.00)
BKR WAM BASOPHILS: 0.5 % (ref 0.0–1.4)
BKR WAM EOSINOPHIL ABSOLUTE COUNT.: 0.24 x 1000/ÂµL (ref 0.00–1.00)
BKR WAM EOSINOPHILS: 3.2 % (ref 0.0–5.0)
BKR WAM HEMATOCRIT (2 DEC): 33.1 % — ABNORMAL LOW (ref 35.00–45.00)
BKR WAM HEMOGLOBIN: 10.1 g/dL — ABNORMAL LOW (ref 11.7–15.5)
BKR WAM IMMATURE GRANULOCYTES: 0.3 % (ref 0.0–1.0)
BKR WAM LYMPHOCYTES: 35.5 % (ref 17.0–50.0)
BKR WAM MCH (PG): 26.5 pg — ABNORMAL LOW (ref 27.0–33.0)
BKR WAM MCHC: 30.5 g/dL — ABNORMAL LOW (ref 31.0–36.0)
BKR WAM MCV: 86.9 fL (ref 80.0–100.0)
BKR WAM MONOCYTE ABSOLUTE COUNT.: 0.72 x 1000/ÂµL (ref 0.00–1.00)
BKR WAM MONOCYTES: 9.5 % (ref 4.0–12.0)
BKR WAM MPV: 10 fL (ref 8.0–12.0)
BKR WAM NEUTROPHILS: 51 % (ref 39.0–72.0)
BKR WAM NUCLEATED RED BLOOD CELLS: 0 % (ref 0.0–1.0)
BKR WAM PLATELETS: 340 x1000/ÂµL (ref 150–420)
BKR WAM RDW-CV: 15.7 % — ABNORMAL HIGH (ref 11.0–15.0)
BKR WAM RED BLOOD CELL COUNT.: 3.81 M/ÂµL — ABNORMAL LOW (ref 4.00–6.00)
BKR WAM WHITE BLOOD CELL COUNT: 7.6 x1000/ÂµL (ref 4.0–11.0)

## 2022-05-20 LAB — BASIC METABOLIC PANEL
BKR ANION GAP: 9 (ref 7–17)
BKR BLOOD UREA NITROGEN: 12 mg/dL (ref 8–23)
BKR BUN / CREAT RATIO: 37.5 — ABNORMAL HIGH (ref 8.0–23.0)
BKR CALCIUM: 9.9 mg/dL (ref 8.8–10.2)
BKR CHLORIDE: 108 mmol/L — ABNORMAL HIGH (ref 98–107)
BKR CO2: 24 mmol/L (ref 20–30)
BKR CREATININE: 0.32 mg/dL — ABNORMAL LOW (ref 0.40–1.30)
BKR EGFR, CREATININE (CKD-EPI 2021): >60 mL/min/{1.73_m2} (ref >=60)
BKR GLUCOSE: 85 mg/dL (ref 70–100)
BKR POTASSIUM: 3.7 mmol/L (ref 3.3–5.3)
BKR SODIUM: 141 mmol/L (ref 136–144)

## 2022-05-20 LAB — PHOSPHORUS     (BH GH L LMW YH): BKR PHOSPHORUS: 2.4 mg/dL (ref 2.2–4.5)

## 2022-05-20 LAB — MAGNESIUM: BKR MAGNESIUM: 2 mg/dL (ref 1.7–2.4)

## 2022-05-20 NOTE — Plan of Care
Inpatient Physical Therapy Progress Note IP Adult PT Eval/Treat - 05/20/22 1110    Date of Visit / Treatment  Date of Visit / Treatment 05/20/22   Note Type Progress Note   Progress Report Due 06/02/22   End Time 1110    General Information  Subjective I did it!   General Observations Pt received supine in bed; on RA; NAD; ileostomy; purwick; RN cleared   Precautions/Limitations Fall Precautions;Bed alarm    Weight Bearing Status  Weight Bearing Status Comments WBAT BLE    Vital Signs and Orthostatic Vital Signs  Vital Signs Vital Signs Stable   Vital Signs Free text RA; NAD    Pain/Comfort  Pain Comment (Pre/Post Treatment Pain) No c/o pain pre/post session    Patient Coping  Observed Emotional State accepting;cooperative   Verbalized Emotional State acceptance    Cognition  Overall Cognitive Status Impaired   Orientation Level Oriented to person;Oriented to place;Oriented to time   Level of Consciousness alert;confused   Following Commands Follows one step commands with increased time;Follows one step commands with repetition   Personal Safety / Judgment Fall risk;Requires supervision with mobility   Cognition Comments Improved cognition during session    Range of Motion  Range of Motion Comments WFL - tight HM and heel cords B; Decr B shld elevation    Manual Muscle Testing  Manual Muscle Testing Comments + Deconditioned    Skin Assessment  Skin Assessment See Nursing Documentation    Posture, Head/Trunk Alignment  Posture, Head/Trunk Alignment Retropulsive;Right lateral lean    Balance  Sitting Balance: Static  POOR+    Minimal assist to maintain static position with no Assistive Device   Sitting Balance: Dynamic  POOR     Moves through 1/4 to 1/2 ROM range with moderate assist to right self   Balance Assist Device Hand held assist   Balance Skills Training Comment Ax1    Bed Mobility Supine-to-Sit Independence/Assistance Level Maximum assist;Assist of 1   Supine-to-Sit Assist Device Head of bed elevated;Hand held assist   Sit-to-Supine Independence/Assistance Level Maximum assist;Assist of 1   Sit-to-Supine Assist Device Head of bed elevated;Hand held assist   Limitations decreased ability to use legs for bridging/pushing   Bed Mobility, Impairments ROM decreased;strength decreased;sensory feedback impaired   Bed mobility was limited by: impaired motor function   Bed Mobility Comments Retropulsion w/ extreme R lateral lean; utlizied max'd out [facility] to prevent LOB to R; performed BLE therex    Museum/gallery curator Comments Deferred d/t incr need for assist (max Ax2-3)    Handoff Documentation  Handoff Patient in bed;Bed alarm;Patient instructed to call nursing for mobility;Discussed with nursing   Handoff Comments Pillow between knees for contracture prevention and skin breakdown    Endurance  Endurance Comments Poor+    PT- AM-PAC - Basic Mobility Screen- How much help from another person do you currently need.....  Turning from your back to your side while in a a flat bed without using rails? 1 - Total - Requires total assistance or cannot do it at all.   Moving from lying on your back to sitting on the side of a flat bed without using bed rails? 1 - Total - Requires total assistance or cannot do it at all.   Moving to and from a bed to a chair (including a wheelchair)? 1 - Total - Requires total assistance or cannot do it at all.   Standing up from a chair using your arms(e.g.,  wheelchair or bedside chair)? 1 - Total - Requires total assistance or cannot do it at all.   To walk in a hospital room? 1 - Total - Requires total assistance or cannot do it at all.   Climbing 3-5 steps with a railing? 1 - Total - Requires total assistance or cannot do it at all.   AMPAC Mobility Score 6   TARGET Highest Level of Mobility Mobility Level 2, Turn self in bed/bed activity/dependent transfer    ACTUAL Highest Level of Mobility Mobility Level 3, Sit on edge of bed    Therapeutic Exercise  Therapeutic Exercise Comments AROM x 4; PROM BUE shld elevation    Neuromuscular Re-education  Neuromuscular Re-education Comments Seated static/dynamic PC training    Therapeutic Functional Activity  Therapeutic Functional Activity Comments Bed mobility; Pt edu    Clinical Impression  Follow up Assessment Pt seen for Pt f/u. Required maxAx1 for bed mobility. Seated EOB, displayed retropulsion and extreme R lateral lean requriing HHA and VCs to attempt to correct. Showed improve BLE motor activation w/ LAQs, ~ 45 knee ext AROM. Will benefit from skilled services to improve gross functional ROM/strength, postural control/balance, and activity tolerance. Current PT d/c rec is SNF for LTC.   Criteria for Skilled Therapeutic Interventions Met yes    Patient/Family Stated Goals  Patient/Family Stated Goal(s) decrease pain;feel better    Frequency/Equipment Recommendations  PT Frequency 2x per week   What day of week is next treatment expected? Thursday   6/1  PT/PTA completing this assessment RL   Equipment Needs During Admission/Treatment No device    PT Recommendations for Inpatient Admission  Activity/Level of Assist dangle edge of bed;assist of 2   Therapeutic Exercise ROM as tolerated   Other/Comments Pillow between knees in supine for contracture management and prevention of skin breakdown; encourage B knee extension in supine; encourage B podus boots or shorten bed to maintain 0 deg of ankle DF    Planned Treatment / Interventions  Plan for Next Visit Progress as tol   Training Treatment / Interventions Balance / Investment banker, operational;Endurance Training;Functional Personnel officer / Interventions Patient Education / Training    PT Discharge Summary Physical Therapy Disposition Recommendation SNF for long term placement     Shanon Payor PT, DPTMHB: [phone]

## 2022-05-20 NOTE — Plan of Care
Plan of Care Overview/ Patient Status    [name]&Ox3, vss, no complaint of pain. Peg dressing changed. Scant dried drainage noted. TF administered as ordered. Tolerating well. PO intake adequate. Encouragement provided.  T&P q 2hrs, safety maintained bed alarm on, call bell in reach.

## 2022-05-20 NOTE — Plan of Care
Plan of Care Overview/ Patient Status    1900-0700Pt is A&Ox3, disoriented to situation on RA. Pt denied any pain and SOB this shift. Pt takes their pills whole. Pt has a regular diet with tube feedings of Jevity 1.5. Pt is incontinent of urine, purewick in place draining yellow urine. Pt has an ileostomy draining brown stool. HOurly rounding completed, safety maintained, will continue to follow POC.

## 2022-05-20 NOTE — Progress Notes
[facility] Medicine Progress NoteAttending Provider: Eliberto Ivory, MD Subjective                                                                              Subjective: Interim History: NAD. Awaiting financial documents to be submitted for title 19 approval. Review of Allergies/Meds/Hx: Review of Allergies/Meds/Hx:I have reviewed the patient's: allergies Objective Objective: Vitals:Last 24 hours: Temp:  [97.4 ?F (36.3 ?C)-97.6 ?F (36.4 ?C)] 97.6 ?F (36.4 ?C)Pulse:  [66-76] 69Resp:  [16-20] 20BP: (101-120)/(62-70) 109/70SpO2:  [98 %-100 %] 100 %I/O's:Gross Totals (Last 24 hours) at 05/20/2022 1406Last data filed at 05/20/2022 1300Intake 1539 ml Output 1350 ml Net 189 ml Procedures:None Physical ExamConstitutional:     General: She is not in acute distress.   Appearance: She is not ill-appearing. HENT:    Head: Normocephalic and atraumatic.    Right Ear: External ear normal.    Left Ear: External ear normal.    Nose: Nose normal.    Mouth/Throat:    Mouth: Mucous membranes are moist. Eyes:    Extraocular Movements: Extraocular movements intact.    Pupils: Pupils are equal, round, and reactive to light. Cardiovascular:    Rate and Rhythm: Normal rate and regular rhythm.    Pulses: Normal pulses.    Heart sounds: Normal heart sounds. Pulmonary:    Effort: Pulmonary effort is normal.    Breath sounds: Normal breath sounds. Abdominal:    General: Bowel sounds are normal. There is no distension.    Palpations: Abdomen is soft.    Comments: Colostomy bag in place Musculoskeletal:       General: No swelling or tenderness.    Cervical back: Normal range of motion and neck supple. Skin:   General: Skin is warm and dry. Neurological:    Mental Status: She is alert and oriented to person, place, and time. Psychiatric:       Mood and Affect: Mood normal. Behavior: Behavior normal.  Labs:Recent Labs Lab 05/25/230702 05/28/230603 05/30/230420 WBC 5.6 6.4 7.6 HGB 10.9* 10.4* 10.1* HCT 34.70* 33.40* 33.10* PLT 410 371 340  Recent Labs Lab 05/25/230702 05/28/230603 05/30/230420 NEUTROPHILS 41.7 43.9 51.0  Recent Labs Lab 05/27/230559 05/28/230603 05/30/230420 NA 141 140 141 K 3.9 3.8 3.7 CL 108* 110* 108* CO2 26 22 24  BUN 16 12 12  CREATININE 0.38* 0.37* 0.32* GLU 88 86 85 ANIONGAP 7 8 9   Recent Labs Lab 05/27/230559 05/28/230603 05/30/230412 05/30/230420 CALCIUM 10.1 9.7  --  9.9 MG  --   --  2.0  --  PHOS  --   --  2.4  --   Recent Labs Lab 05/23/231557 ALT 14 AST 21 ALKPHOS 71 BILITOT <0.2 BILIDIR <0.2  No results for input(s): PTT, LABPROT, INR in the last 168 hours. No results for input(s): LABBLOO, LABURIN, LOWERRESPIRA in the last 168 hours.Diagnostics:XR Foot Right AP Lateral and ObliqueResult Date: 05/02/2022 No acute osseous abnormality.  Please note evaluation is markedly limited due to osseous demineralization . [facility] Radiology Notify System:  Routine Reported And Signed By: Ricci Barker, MD  [facility] Radiology and Biomedical Imaging XR Knee Bilateral AP and Lateral [facility] LM)Result Date: 05/10/2022 Degenerative changes, as above. [facility] Radiology Notify System:  Routine Reported And Signed By: Ricci Barker, MD  [facility] US Duplex Lower Extremity Venous BilatResult Date: 05/12/2022 Nonocclusive acute deep venous thrombus is noted within the right popliteal vein and tibioperoneal trunk. [facility] Radiology Notify System Classification: Orange - Urgent without Follow-up Reported And Signed By: Verl Bangs, MD  [facility] Radiology and Biomedical Imaging ECG/Tele Events: None today Assessment Assessment: Patient is a 33yof w/PMH of RA, diverticular perforation, 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 Subsequently in 12/22-found to have abdominal abscess drained-G-tube placement-after Ethics involvement for nutrition; went to a STR facility in [place] in 01/2022-brought back to [facility] in 02/11/22 due to ongoing c/f encephalopathy and FTT observed by her sister, requested for LTACH. Underwent MRI brain/spine; LP; paraneoplastic autonomic encephalopathy; evaluated by neuro, OP-EMG advised. Awaiting title-19.  Plan Plan: Non-occlusive DVT despite on eliquis for previous DVTHeme on boardCont. Lovenox 80mg  bidB/L Knee pain w/hx of RAxr-w/DJDRheum on boardCont. Prednisone taper down by 5mg  every 3-5 daysNoted improvement in pain w/steroidsCont. Tramadol prn and lidoderm patchAcute encephalopathy-improvingwnl MRI brain/spineLP wnlParaneoplastic and autoimmune panels negativeEMG recommended by neuro-declined by patient-no add'l intervention advisedCont. GabapentinPerforated diverticulum with transaminits s/p right hemicolectomy w/end ileostomyC/b fluid abscess [place] from [place]-CTX +Zosyn-now resolvedIron def. Cont. Iron repletionDysphagia-seen by [employer]. Modified dietCont. G-tube feedsChronic pain due to polyneuropathyCont. Gabapentin and lidocaineHLDCont. Crestor and niacinPPXLovenox 80mg  bid Electronically Signed:Saafir Abdullah Michae Kava, MD 05/20/2022, 1:27 PM

## 2022-05-21 MED ORDER — PREDNISONE 10 MG TABLET
10 mg | Freq: Every day | ORAL | Status: CP
Start: 2022-05-21 — End: ?
  Administered 2022-05-21 – 2022-05-25 (×5): 10 mg via ORAL

## 2022-05-21 MED ORDER — PREDNISONE 5 MG TABLET
5 mg | Freq: Every day | ORAL | Status: CP
Start: 2022-05-21 — End: ?
  Administered 2022-05-26 – 2022-05-30 (×5): 5 mg via ORAL

## 2022-05-21 NOTE — Plan of Care
Plan of Care Overview/ Patient Status    Danielle [name]					Location: EP [id number] y.o., female				Attending: Eliberto Ivory, MD	Admit Date: 02/11/2022			[name] LOS: 99 days FOLLOW-UP NUTRITION ASSESSMENTDIET ORDER:  Dietary Orders (From admission, onward)     Start     Ordered  04/24/22 1446  Diet Tube Feed With Tray  (Diet Tube Feed Panel)  DIET EFFECTIVE NOW      Comments: JEVITY 1.5 @ 1.5 carton bolus @9am  and 1 carton BID (5pm, 9pm) to total 3.5 cartons daily Question Answer Comment Diet Selection? Regular  Safety Tray? (Suicide/Homicide/Police Custody) Safety Tray - No  Adult Tube Feed Formulas: Jevity 1.5 (YNH,SRC)  Feeding Route: G-Tube  Initiate Nutrition Management Protocol (Yes/No?) Yes - Initiate Protocol    04/24/22 1446    ANTHROPOMETRICS:Height:?70Admit wt:?79.4 kg (per SNF documentation.)Current wt: 74.8 kg BMI: 23.00 Dosing wt:  74.8 kg (current wt as bmi is wnl)Additional wt information:Ht confirmed with pt: yes1-2+ edema noted per flowsheets. Wt increase of 2.1 kg noted since last nutrition assessment likely d/t fluid status. Will ctm wts. Epic wt hx:Wt: 05/21/22 74.8 kg 12/13/18 98.6 kg ESTIMATED NUTRITION REQUIREMENTS:Kcal/day:?1870????????????????????(25 ?kcal/kg)Protein/day:?75-90 ?????????????(1-1.2gm/kg)Fluids/day:?1870 mL/d?(35mL/kcal) ONLY as medical and fluid status allows and only per team discretion. Needs based on:?Dosing Wt (74.8?kg), maintenance?NUTRITION ASSESSMENT: Chart reviewed for follow up. Meds and labs reviewed.  Since last nutrition assessment pt has continued to tolerate po intake while TF is at goal. Per chart pt has been ordering an average of 1277 kcal with 49 g protein per day. Met with pt at bedside. Pt endorses a good appetite and reports she is having 3 meals @ 100% intake with no n/v/abd pain. Pt had no nutrition related questions. Suspect based on pt report of intake with the addition of TF pt is meeting nutrition needs. Nutrition will continue to monitor for the remainder of admission. Cultural/Religious/Ethnic Needs:?Unknown?Food Allergies: NKFANUTRITION DIAGNOSIS:Severe malnutrition r/t depletion aeb moderate muscle and fat depletion.- continues?INTERVENTIONS/RECOMMENDATIONS: Meals and Snacks:-Continue with Regular trays-Encourage protein rich food sources with each tray ordered to better meet protein needsGoal:?po intake?for comfort given support provided by?goal?volume bolus feeds, -continues?Enteral Nutrition:?	Continue?JEVITY 1.5?@ 1.5?carton bolus @9am ?and?1 carton?BID (5pm,?9pm)?to total?3.5?cartons daily-	Provides:?827mL, 1244 kcal,?53gm protein,? free waterGoal: ?Patient will receive >90% of goal volume TF/ TPN prior to next nutrition assessment-suspect met, continueDischarge Planning and Transfer of Nutrition Care:  Nutrition related discharge needs still being determined at this time, will continue to follow  MONITORING / EVALUATION:Food/Nutrition-Related History:Food and Nutrient IntakeEnteral and Parenteral Nutrition IntakeAnthropometric Measurements:Weight Biochemical Data, Medical Tests and Procedures:Electrolyte and renal profileGlucose/endocrine profileNutrition-Focus Physical FindingsOverall findingsElectronically signed by Genevive Bi, May 31, 2023Please note: Nutrition is a consult only service on weekends and holidays.  Please enter a consult in EPIC if assistance is needed on a weekend or holiday or via [facility] [phone] to contact the covering RD.

## 2022-05-21 NOTE — Plan of Care
Plan of Care Overview/ Patient Status    [name]&Ox3, vss, no complaint of pain. Peg dressing changed. Scant dried drainage noted. Colostomy bag changed this shift. TF administered as ordered. Tolerating well. PO intake adequate. Encouragement provided.  T&P q 2hrs, safety maintained bed alarm on, call bell in reach.

## 2022-05-21 NOTE — Plan of Care
Plan of Care Overview/ Patient Status    1900-0700:  Pt A&Ox3 d/o situation.??VSS on RA. Denies SOB, CP, N/V/D.?Scheduled meds given per order. 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 Pt incontinent of urine, prn care provided Purewick changed and in place.?PEG tube?in place, dressing changed this shift.?Bolus TF given 1 carton of Jevity?1.5.??Good PO intake.?BLE elevated. T&Rq2h. Pt resting in bed.?Hourly rounding. Safety precautions maintained, bed alarm on, call bell in reach, will ctm and follow nursing pocProblem: Adult Inpatient Plan of CareGoal: Plan of Care ReviewOutcome: Interventions implemented as appropriateFlowsheets (Taken 05/21/2022 0735)Progress: no changePlan of Care Reviewed With: patient

## 2022-05-21 NOTE — Progress Notes
[address] Medicine Progress NoteAttending Provider: Eliberto Ivory, MD Subjective                                                                              Subjective: Interim History: NAD. Asking for her prednisone meds.  Awaiting financial documents to be submitted for title 19 approval. Review of Allergies/Meds/Hx: Review of Allergies/Meds/Hx:I have reviewed the patient's: allergies Objective Objective: Vitals:Last 24 hours: Temp:  [97.6 ?F (36.4 ?C)-98.6 ?F (37 ?C)] 98.6 ?F (37 ?C)Pulse:  [60-69] 69Resp:  [18-20] 20BP: (107-122)/(62-78) 109/74SpO2:  [96 %-98 %] 98 %I/O's:Gross Totals (Last 24 hours) at 05/21/2022 1525Last data filed at 05/21/2022 0900Intake 677 ml Output -- Net 677 ml Procedures:None Physical ExamConstitutional:     General: She is not in acute distress.   Appearance: She is not ill-appearing. HENT:    Head: Normocephalic and atraumatic.    Right Ear: External ear normal.    Left Ear: External ear normal.    Nose: Nose normal.    Mouth/Throat:    Mouth: Mucous membranes are moist. Eyes:    Extraocular Movements: Extraocular movements intact.    Pupils: Pupils are equal, round, and reactive to light. Cardiovascular:    Rate and Rhythm: Normal rate and regular rhythm.    Pulses: Normal pulses.    Heart sounds: Normal heart sounds. Pulmonary:    Effort: Pulmonary effort is normal.    Breath sounds: Normal breath sounds. Abdominal:    General: Bowel sounds are normal. There is no distension.    Palpations: Abdomen is soft.    Comments: Colostomy bag in place Musculoskeletal:       General: No swelling or tenderness.    Cervical back: Normal range of motion and neck supple. Skin:   General: Skin is warm and dry. Neurological:    Mental Status: She is alert and oriented to person, place, and time. Psychiatric:       Mood and Affect: Mood normal.       Behavior: Behavior normal.  Labs:Recent Labs Lab 05/25/230702 05/28/230603 05/30/230420 WBC 5.6 6.4 7.6 HGB 10.9* 10.4* 10.1* HCT 34.70* 33.40* 33.10* PLT 410 371 340  Recent Labs Lab 05/25/230702 05/28/230603 05/30/230420 NEUTROPHILS 41.7 43.9 51.0  Recent Labs Lab 05/27/230559 05/28/230603 05/30/230420 NA 141 140 141 K 3.9 3.8 3.7 CL 108* 110* 108* CO2 26 22 24  BUN 16 12 12  CREATININE 0.38* 0.37* 0.32* GLU 88 86 85 ANIONGAP 7 8 9   Recent Labs Lab 05/27/230559 05/28/230603 05/30/230412 05/30/230420 CALCIUM 10.1 9.7  --  9.9 MG  --   --  2.0  --  PHOS  --   --  2.4  --   No results for input(s): ALT, AST, ALKPHOS, BILITOT, BILIDIR in the last 168 hours. No results for input(s): PTT, LABPROT, INR in the last 168 hours. No results for input(s): LABBLOO, LABURIN, LOWERRESPIRA in the last 168 hours.Diagnostics:XR Foot Right AP Lateral and ObliqueResult Date: 05/02/2022 No acute osseous abnormality.  Please note evaluation is markedly limited due to osseous demineralization . [facility] Radiology Notify System:  Routine Reported And Signed By: Ricci Barker, MD  [facility] Radiology and Biomedical Imaging XR Knee Bilateral AP and Lateral [facility] LM)Result Date:  05/10/2022 Degenerative changes, as above. [facility] Radiology Notify System:  Routine Reported And Signed By: Ricci Barker, MD  [facility] Radiology and Biomedical Imaging US Duplex Lower Extremity Venous BilatResult Date: 05/12/2022 Nonocclusive acute deep venous thrombus is noted within the right popliteal vein and tibioperoneal trunk. 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 Subsequently in 12/22-found to have abdominal abscess drained-G-tube placement-after Ethics involvement for nutrition; went to a STR facility in [place] in 01/2022-brought back to [facility] in 02/11/22 due to ongoing c/f encephalopathy and FTT observed by her sister, requested for LTACH. Underwent MRI brain/spine; LP; paraneoplastic autonomic encephalopathy; evaluated by neuro, OP-EMG advised. Awaiting title-19.  Plan Plan: Non-occlusive DVT despite on eliquis for previous DVTHeme on boardCont. Lovenox 80mg  bidB/L Knee pain w/hx of RAxr-w/DJDRheum on boardCont. Prednisone taper down by 5mg  every 3-5 daysNoted improvement in pain w/steroidsCont. Tramadol prn and lidoderm patchAcute encephalopathy-improvingwnl MRI brain/spineLP wnlParaneoplastic and autoimmune panels negativeEMG recommended by neuro-declined by patient-no add'l intervention advisedCont. GabapentinPerforated diverticulum with transaminits s/p right hemicolectomy w/end ileostomyC/b fluid abscess [place] from [place]-CTX +Zosyn-now resolvedIron def. Cont. Iron repletionDysphagia-seen by [employer]. Modified dietCont. G-tube feedsChronic pain due to polyneuropathyCont. Gabapentin and lidocaineHLDCont. Crestor and niacinPPXLovenox 80mg  bid Electronically Signed:Harish Bram Michae Kava, MD 05/21/2022, 1:27 PM

## 2022-05-22 NOTE — Plan of Care
Plan of Care Overview/ Patient Status    0700-1900A&O: x3 d/o situation Cardiac: WDL Resp: WDL room air, denies SOBGI/GU: ileostomy. Incontinent of urine, purwick in place.Diet: regular and tube feed. jevity 1.5 given per orders.Skin: see flowsheets Pain: bliateral leg pain see mar for interventions Mobility: total Access: PIV left wrist c/d/i/patentPills: whole Safety maintained, call bell in reach, bed alarm set, rounding continued.

## 2022-05-22 NOTE — Plan of Care
Inpatient Physical Therapy Progress Note IP Adult PT Eval/Treat - 05/22/22 0935    Date of Visit / Treatment  Date of Visit / Treatment 05/22/22   Note Type Progress Note   Progress Report Due 06/02/22   End Time 0935    General Information  Subjective Only when you move it.   General Observations Pt received supine in bed; on RA; NAD; purwick; colostomy; RN cleared   Precautions/Limitations Fall Precautions;Bed alarm    Weight Bearing Status  Weight Bearing Status Comments WBAT BLE    Vital Signs and Orthostatic Vital Signs  Vital Signs Vital Signs Stable   Vital Signs Free text RA; NAD    Pain/Comfort  Pain Comment (Pre/Post Treatment Pain) No c/o pain at rest; c/o unquantified BLE pain when mobilized, RN aware and repositioned for comfort.    Patient Coping  Observed Emotional State accepting;cooperative   Verbalized Emotional State acceptance    Cognition  Overall Cognitive Status Impaired   Orientation Level Oriented X4   Incr time for place  Level of Consciousness alert;confused   Following Commands Follows one step commands with increased time;Follows one step commands with repetition   Personal Safety / Judgment Fall risk;Requires supervision with mobility   Cognition Comments Pleasant    Range of Motion  Range of Motion Comments WFL - tight HM and heel cords B; Decr B shld elevation    Manual Muscle Testing  Manual Muscle Testing Comments + Deconditioned    Skin Assessment  Skin Assessment See Nursing Documentation    Balance  Sitting Balance: Static  POOR+    Minimal assist to maintain static position with no Assistive Device   Sitting Balance: Dynamic  POOR     Moves through 1/4 to 1/2 ROM range with moderate assist to right self   Balance Assist Device Hand held assist   Balance Skills Training Comment Ax1    Bed Mobility  Supine-to-Sit Independence/Assistance Level Maximum assist;Assist of 1 Supine-to-Sit Assist Device Hand held assist;Head of bed elevated   Sit-to-Supine Independence/Assistance Level Maximum assist;Assist of 1   Sit-to-Supine Assist Device Hand held assist;Head of bed elevated   Bed Mobility, Impairments ROM decreased;postural control impaired;pain;sensory feedback impaired;strength decreased   Bed Mobility Comments Retropulsive w/ R lateral lean requiring VCs and TCs to correct; performed BLE therex and crossbody reaches seated EOB; deferred standing d/t incr need for assist    Handoff Documentation  Handoff Patient in bed;Bed alarm;Patient instructed to call nursing for mobility;Discussed with nursing    Endurance  Endurance Comments Poor+    PT- AM-PAC - Basic Mobility Screen- How much help from another person do you currently need.....  Turning from your back to your side while in a a flat bed without using rails? 1 - Total - Requires total assistance or cannot do it at all.   Moving from lying on your back to sitting on the side of a flat bed without using bed rails? 1 - Total - Requires total assistance or cannot do it at all.   Moving to and from a bed to a chair (including a wheelchair)? 1 - Total - Requires total assistance or cannot do it at all.   Standing up from a chair using your arms(e.g., wheelchair or bedside chair)? 1 - Total - Requires total assistance or cannot do it at all.   To walk in a hospital room? 1 - Total - Requires total assistance or cannot do it at all.   Climbing 3-5 steps with  a railing? 1 - Total - Requires total assistance or cannot do it at all.   AMPAC Mobility Score 6   TARGET Highest Level of Mobility Mobility Level 2, Turn self in bed/bed activity/dependent transfer    ACTUAL Highest Level of Mobility Mobility Level 3, Sit on edge of bed    Therapeutic Exercise  Therapeutic Exercise Comments AROM x 4; PROM BUE    Neuromuscular Re-education  Neuromuscular Re-education Comments Seated static/dynamic PC training    Therapeutic Functional Activity  Therapeutic Functional Activity Comments Bed mobility; Pt edu    Clinical Impression  Follow up Assessment Pt seen for Pt f/u. Required maxAx1 for bed mobility. Seated EOB, displayed retropulsion and R lateral lean requiring TCs and VCs to correct. Performed BLE therex and cross body reaches seated EOB. Will benefit from skilled services to improve gross functional ROM/strength, postural control/balance, and activity tolerance. Current PT d/c rec is SNF for LTC.   Criteria for Skilled Therapeutic Interventions Met yes    Patient/Family Stated Goals  Patient/Family Stated Goal(s) decrease pain;return to prior level of functioning    Frequency/Equipment Recommendations  PT Frequency 2x per week   What day of week is next treatment expected? Monday   6/5  PT/PTA completing this assessment RL   Equipment Needs During Admission/Treatment No device    PT Recommendations for Inpatient Admission  Activity/Level of Assist dangle edge of bed;assist of 2   Positioning chair position of the bed;reposition frequently;elevate heels   Therapeutic Exercise ROM as tolerated   Other/Comments Pillow between knees in supine for contracture management and prevention of skin breakdown; encourage B knee extension in supine; encourage B podus boots or shorten bed to maintain 0 deg of ankle DF    Planned Treatment / Interventions  Plan for Next Visit Progress as tol. Stand?   Training Treatment / Interventions Balance / Investment banker, operational;Endurance Training;Functional Personnel officer / Interventions Patient Education / Training    PT Discharge Summary  Physical Therapy Disposition Recommendation SNF for long term placement     Shanon Payor PT, DPTMHB: [phone]

## 2022-05-22 NOTE — Progress Notes
[facility] Medicine Progress NoteAttending Provider: Eliberto Ivory, MD Subjective                                                                              Subjective: Interim History: NAD. Asking for her prednisone meds. C/o pain with moving her extremities.  Awaiting financial documents to be submitted for title 19 approval. Review of Allergies/Meds/Hx: Review of Allergies/Meds/Hx:I have reviewed the patient's: allergies Objective Objective: Vitals:Last 24 hours: Temp:  [97.6 ?F (36.4 ?C)-98.9 ?F (37.2 ?C)] 97.6 ?F (36.4 ?C)Pulse:  [61-83] 62Resp:  [16-20] 20BP: (99-119)/(60-72) 99/60SpO2:  [99 %-100 %] 99 %I/O's:Gross Totals (Last 24 hours) at 05/22/2022 1621Last data filed at 05/22/2022 1200Intake 1127 ml Output 700 ml Net 427 ml Procedures:None Physical ExamConstitutional:     General: She is not in acute distress.   Appearance: She is not ill-appearing. HENT:    Head: Normocephalic and atraumatic.    Right Ear: External ear normal.    Left Ear: External ear normal.    Nose: Nose normal.    Mouth/Throat:    Mouth: Mucous membranes are moist. Eyes:    Extraocular Movements: Extraocular movements intact.    Pupils: Pupils are equal, round, and reactive to light. Cardiovascular:    Rate and Rhythm: Normal rate and regular rhythm.    Pulses: Normal pulses.    Heart sounds: Normal heart sounds. Pulmonary:    Effort: Pulmonary effort is normal.    Breath sounds: Normal breath sounds. Abdominal:    General: Bowel sounds are normal. There is no distension.    Palpations: Abdomen is soft.    Comments: Colostomy bag in place Musculoskeletal:       General: No swelling or tenderness.    Cervical back: Normal range of motion and neck supple. Skin:   General: Skin is warm and dry. Neurological:    Mental Status: She is alert and oriented to person, place, and time. Psychiatric:       Mood and Affect: Mood normal.       Behavior: Behavior normal.  Labs:Recent Labs Lab 05/28/230603 05/30/230420 WBC 6.4 7.6 HGB 10.4* 10.1* HCT 33.40* 33.10* PLT 371 340  Recent Labs Lab 05/28/230603 05/30/230420 NEUTROPHILS 43.9 51.0  Recent Labs Lab 05/27/230559 05/28/230603 05/30/230420 NA 141 140 141 K 3.9 3.8 3.7 CL 108* 110* 108* CO2 26 22 24  BUN 16 12 12  CREATININE 0.38* 0.37* 0.32* GLU 88 86 85 ANIONGAP 7 8 9   Recent Labs Lab 05/27/230559 05/28/230603 05/30/230412 05/30/230420 CALCIUM 10.1 9.7  --  9.9 MG  --   --  2.0  --  PHOS  --   --  2.4  --   No results for input(s): ALT, AST, ALKPHOS, BILITOT, BILIDIR in the last 168 hours. No results for input(s): PTT, LABPROT, INR in the last 168 hours. No results for input(s): LABBLOO, LABURIN, LOWERRESPIRA in the last 168 hours.Diagnostics:XR Foot Right AP Lateral and ObliqueResult Date: 05/02/2022 No acute osseous abnormality.  Please note evaluation is markedly limited due to osseous demineralization . [facility] Radiology Notify System:  Routine Reported And Signed By: Ricci Barker, MD  [facility] Radiology and Biomedical Imaging XR Knee Bilateral AP and Lateral [facility] LM)Result Date:  05/10/2022 Degenerative changes, as above. [facility] Radiology Notify System:  Routine Reported And Signed By: Ricci Barker, MD  [facility] Radiology and Biomedical Imaging US Duplex Lower Extremity Venous BilatResult Date: 05/12/2022 Nonocclusive acute deep venous thrombus is noted within the right popliteal vein and tibioperoneal trunk. 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 Subsequently in 12/22-found to have abdominal abscess drained-G-tube placement-after Ethics involvement for nutrition; went to a STR facility in [place] in 01/2022-brought back to [facility] in 02/11/22 due to ongoing c/f encephalopathy and FTT observed by her sister, requested for LTACH. Underwent MRI brain/spine; LP; paraneoplastic autonomic encephalopathy; evaluated by neuro, OP-EMG advised. Awaiting title-19.  Plan Plan: Non-occlusive DVT despite on eliquis for previous DVTHeme on boardCont. Lovenox 80mg  bidB/L Knee pain w/hx of RA Pain likely due to RA vs contractures due to inactivity?xr-w/DJDRheum on boardCont. Prednisone at 5mg  daily dosing indefinitelyNoted improvement in pain w/steroidsCont. Tramadol prn and lidoderm patchNeeds PT/OT to work with her frequentlyAcute encephalopathy-improvingwnl MRI brain/spineLP wnlParaneoplastic and autoimmune panels negativeEMG recommended by neuro-declined by patient-no add'l intervention advisedCont. GabapentinPerforated diverticulum with transaminits s/p right hemicolectomy w/end ileostomyC/b fluid abscess [place]ILI from abx-CTX +Zosyn-now resolvedIron def. Cont. Iron repletionDysphagia-seen by [employer]. Modified dietCont. G-tube feedsChronic pain due to polyneuropathyCont. Gabapentin and lidocaineHLDCont. Crestor and niacinPPXLovenox 80mg  bid Electronically Signed:Jamyson Jirak Michae Kava, MD 05/22/2022, 1:27 PM

## 2022-05-23 MED ORDER — CALCIUM CARBONATE 200 MG CALCIUM (500 MG) CHEWABLE TABLET
500200 mg (200 mg calcium) | Freq: Three times a day (TID) | ORAL | Status: DC | PRN
Start: 2022-05-23 — End: 2022-06-11
  Administered 2022-05-24 (×2): 500 mg (200 mg calcium) via ORAL

## 2022-05-23 NOTE — Plan of Care
Discussed with Dr. Danice Goltz and with hospitalist Dr. Michae Kava. Patient had improvement of knee pain with prednisone taper. She is too frail and high infection risk to resume methotrexate (can be considered as outpt if her functional status improves). If she remains with joint pain, appropriate to continue on chronic prednisone 5 mg/d in the meantime. Please contact Rheumatology closer to the time of discharge so we can arrange follow up.

## 2022-05-23 NOTE — Plan of Care
Plan of Care Overview/ Patient Status    0700-1900A&O: x3 d/o situation Cardiac: WDL Resp: WDL room air, denies SOBGI/GU: ileostomy. Incontinent of urine, purwick in place.Diet: regular and tube feed. jevity 1.5 given per orders.Skin: see flowsheets Pain: bliateral leg pain see mar for interventions Mobility: total Access: PIV left wrist c/d/i/patentPills: whole Safety maintained, call bell in reach, bed alarm set, rounding continued.

## 2022-05-23 NOTE — Progress Notes
[facility] Medicine Progress NoteAttending Provider: Eliberto Ivory, MD Subjective                                                                              Subjective: Interim History: NAD.  Awaiting financial documents to be submitted for title 19 approval. Review of Allergies/Meds/Hx: Review of Allergies/Meds/Hx:I have reviewed the patient's: allergies Objective Objective: Vitals:Last 24 hours: Temp:  [97.6 ?F (36.4 ?C)-98.6 ?F (37 ?C)] 98.4 ?F (36.9 ?C)Pulse:  [62-78] 65Resp:  [18-20] 20BP: (93-112)/(60-69) 93/60SpO2:  [97 %-99 %] 99 %I/O's:Gross Totals (Last 24 hours) at 05/23/2022 1112Last data filed at 05/23/2022 0900Intake 1219 ml Output 850 ml Net 369 ml Procedures:None Physical ExamConstitutional:     General: She is not in acute distress.   Appearance: She is not ill-appearing. HENT:    Head: Normocephalic and atraumatic.    Right Ear: External ear normal.    Left Ear: External ear normal.    Nose: Nose normal.    Mouth/Throat:    Mouth: Mucous membranes are moist. Eyes:    Extraocular Movements: Extraocular movements intact.    Pupils: Pupils are equal, round, and reactive to light. Cardiovascular:    Rate and Rhythm: Normal rate and regular rhythm.    Pulses: Normal pulses.    Heart sounds: Normal heart sounds. Pulmonary:    Effort: Pulmonary effort is normal.    Breath sounds: Normal breath sounds. Abdominal:    General: Bowel sounds are normal. There is no distension.    Palpations: Abdomen is soft.    Comments: Colostomy bag in place Musculoskeletal:       General: No swelling or tenderness.    Cervical back: Normal range of motion and neck supple. Skin:   General: Skin is warm and dry. Neurological:    Mental Status: She is alert and oriented to person, place, and time. Psychiatric:       Mood and Affect: Mood normal.       Behavior: Behavior normal.  Labs:Recent Labs Lab 05/28/230603 05/30/230420 WBC 6.4 7.6 HGB 10.4* 10.1* HCT 33.40* 33.10* PLT 371 340  Recent Labs Lab 05/28/230603 05/30/230420 NEUTROPHILS 43.9 51.0  Recent Labs Lab 05/27/230559 05/28/230603 05/30/230420 NA 141 140 141 K 3.9 3.8 3.7 CL 108* 110* 108* CO2 26 22 24  BUN 16 12 12  CREATININE 0.38* 0.37* 0.32* GLU 88 86 85 ANIONGAP 7 8 9   Recent Labs Lab 05/27/230559 05/28/230603 05/30/230412 05/30/230420 CALCIUM 10.1 9.7  --  9.9 MG  --   --  2.0  --  PHOS  --   --  2.4  --   No results for input(s): ALT, AST, ALKPHOS, BILITOT, BILIDIR in the last 168 hours. No results for input(s): PTT, LABPROT, INR in the last 168 hours. No results for input(s): LABBLOO, LABURIN, LOWERRESPIRA in the last 168 hours.Diagnostics:XR Foot Right AP Lateral and ObliqueResult Date: 05/02/2022 No acute osseous abnormality.  Please note evaluation is markedly limited due to osseous demineralization . [facility] Radiology Notify System:  Routine Reported And Signed By: Ricci Barker, MD  [facility] Radiology and Biomedical Imaging XR Knee Bilateral AP and Lateral [facility] LM)Result Date: 05/10/2022 Degenerative changes, as above. [facility] Radiology Notify System:  Routine  Reported And Signed By: Ricci Barker, MD  [facility] Radiology and Biomedical Imaging US Duplex Lower Extremity Venous BilatResult Date: 05/12/2022 Nonocclusive acute deep venous thrombus is noted within the right popliteal vein and tibioperoneal trunk. 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 Subsequently in 12/22-found to have abdominal abscess drained-G-tube placement-after Ethics involvement for nutrition; went to a STR facility in [place] in 01/2022-brought back to [facility] in 02/11/22 due to ongoing c/f encephalopathy and FTT observed by her sister, requested for LTACH. Underwent MRI brain/spine; LP; paraneoplastic autonomic encephalopathy; evaluated by neuro, OP-EMG advised. Awaiting title-19.  Plan Plan: Non-occlusive DVT despite on eliquis for previous DVTHeme on boardCont. Lovenox 80mg  bidB/L Knee pain w/hx of RA Pain likely due to RA vs contractures due to inactivity?xr-w/DJDRheum on boardCont. Prednisone at 5mg  daily dosing indefinitelyNoted improvement in pain w/steroidsCont. Tramadol prn and lidoderm patchNeeds PT/OT to work with her frequentlyNo plans for MTX at this time given increase risk of infectionsAcute encephalopathy-improvingwnl MRI brain/spineLP wnlParaneoplastic and autoimmune panels negativeEMG recommended by neuro-declined by patient-no add'l intervention advisedCont. GabapentinPerforated diverticulum with transaminits s/p right hemicolectomy w/end ileostomyC/b fluid abscess [place] from [place]x-CTX +Zosyn-now resolvedCheck weekly labs while inpatient-next set on 6/5Iron def. Cont. Iron repletionDysphagia-seen by [employer]. Modified dietCont. G-tube feedsChronic pain due to polyneuropathyCont. Gabapentin and lidocaineHLDCont. Crestor and niacinPPXLovenox 80mg  bid Electronically Signed:Pia Jedlicka Michae Kava, MD 05/23/2022, 1:27 PM

## 2022-05-23 NOTE — Plan of Care
Plan of Care Overview/ Patient Status    1900-0700:Assumed care at 1930. Pt is A&Ox3, disoriented to situation, reorientation provided as needed. Conserved. VSS on RA. LS clear and equal b/l. C/o BLE pain, PRN Tramadol given 1x with + effect; Lidocaine patch to L ankle. No other complaints/symptoms during this time. Regular diet, ate some of dinner. Swallows pills whole. All scheduled medcations administered per [facility]. L PIV intact. PEG in place and patent. TF Jevity1.5 bolus given per order. Banatrol administered via G-tube. Ileostomies intact; R +stool and flatus, L with no output. Purewick to suction collecting yellow urine. Skin intact except healing excoriation to bottom, skin barrier cream applied. 1+/2+ edema noted to BLE, heels offloaded on pillows. Assist of 2 in bed, assisted to T&R q2h. Envision bed in use. Pt not ambulatory and resting in bed between care. No acute events overnight. Safety precautions maintained, bed alarm on, hourly rounding performed. See flowsheet for further details. Will continue to monitor.Problem: Adult Inpatient Plan of CareGoal: Plan of Care ReviewOutcome: Interventions implemented as appropriateGoal: Patient-Specific Goal (Individualized)Outcome: Interventions implemented as appropriateGoal: Absence of Hospital-Acquired Illness or InjuryOutcome: Interventions implemented as appropriateGoal: Optimal Comfort and WellbeingOutcome: Interventions implemented as appropriateGoal: Readiness for Transition of CareOutcome: Interventions implemented as appropriate

## 2022-05-24 NOTE — Plan of Care
Plan of Care Overview/ Patient Status    0700-1900Pt is A&Ox4, VSS on RA, denies pain, SOB, N/V,  ostomy x2 left no output, right medium brown liquid stool, rosebud pink, skin intact beneath appliance, replaced. QT&Rq2hr, oral care q4hours, independently performing. TF via peg as ordered, tolerated well, pills po with water tolerated well. +UO, Bilateral LE in podus boots, safety maintained.

## 2022-05-24 NOTE — Progress Notes
[facility] Medicine Progress NoteAttending Provider: Jone Baseman, MD Subjective                                                                              Subjective: Chart reviewed and pt seen this morning.  Pt disoriented; easily re-oriented with prompting and reminding.  Offered no complaints. Review of Allergies/Meds/Hx: Review of Allergies/Meds/Hx:I have reviewed the patient's: allergies, current scheduled medications and current prn medications Objective Objective: Vitals:I have reviewed the patient's current vital signs as documented in the patient's EMR.   and Last 24 hours: Temp:  [97.4 ?F (36.3 ?C)-98 ?F (36.7 ?C)] 98 ?F (36.7 ?C)Pulse:  [64-73] 64Resp:  [16-20] 20BP: (98-136)/(63-80) 98/63SpO2:  [97 %-99 %] 98 %I/O's:I have reviewed the patient's current I&O's as documented in the EMR.Gross Totals (Last 24 hours) at 05/24/2022 1459Last data filed at 05/24/2022 0600Intake 794 ml Output 1860 ml Net -1066 ml Procedures:None Physical ExamCardiovascular:    Rate and Rhythm: Normal rate.    Heart sounds: Normal heart sounds. Pulmonary:    Effort: Pulmonary effort is normal. Abdominal:    General: Abdomen is flat.    Palpations: Abdomen is soft.    Comments: Soft brown stool in ileostomy bag  Skin:   General: Skin is warm and dry. Neurological:    Mental Status: She is alert. She is disoriented.  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 Subsequently in 12/22-found to have abdominal abscess drained-G-tube placement-after Ethics involvement for nutrition; went to a STR facility in [place] in 01/2022-brought back to [facility] in 02/11/22 due to ongoing c/f encephalopathy and FTT observed by her sister, requested for LTACH. Underwent MRI brain/spine; LP; paraneoplastic autonomic encephalopathy; evaluated by neuro, OP-EMG advised. Awaiting title-19. No acute events in last 24 hours ? Plan Plan: Non-occlusive DVT despite on eliquis for previous DVTAppreciate prior hematology involvementCont. Lovenox 80mg  bid?B/L Knee pain w/hx of RA Pain likely due to RA vs contractures due to inactivity?Appreciate Rheumatology involvementxr-w/DJDCont. Prednisone at 5mg  daily dosing indefinitelyNoted improvement in pain w/steroidsCont. Tramadol prn and lidoderm patchNeeds PT/OT to work with her frequentlyConsider OP MTx if functional status improves ?Acute encephalopathy-improvingNo organic etiology identified wnl MRI brain/spineLP wnlParaneoplastic and autoimmune panels negativeEMG recommended by neuro-declined by patient-no add'l intervention advisedCont. Gabapentin?Perforated diverticulum s/p right hemicolectomy w/end ileostomyC/b fluid abscess [place] Cont. Iron repletion?Dysphagia-seen by [employer]. Modified dietCont. G-tube feeds?Chronic pain due to polyneuropathyCont. Gabapentin and lidocaine?HLDCont. Crestor and niacin?PPXLovenox 80mg  bidFEN G tube feeds with trayCODE / FULL CODE Med recc Performed at time of admission Dispo:Awaiting T-19, had conservatorship hearing on 05/01/22.  Awaiting LTC placement once T-19 is secured. PT last saw pt 6/1.  They recc SNF for LT placement.  Will work with pt 2x a week while here. Poor endurance, deconditioned. Billing code submitted :  [phone] Electronically Signed:Angela M. Vonna Kotyk, PAPlease contact via [facility] 05/24/2022, 2:52 PMHospitalist Attending AddendumStatement:  I have seen and examined [name]  Independently around 10:40am on May 24 2022. I have discussed the case in detail with Kallie Edward PA.  I have reviewed the relevant laboratory and radiology data, as well as other relevant past medical history in the chart. I agree with the findings, physical exam, assessment and the plan of care as documented in the above note, with the following supplemental comments and with the relevant changes mentioned in my note.   Briefly, Ms Mccalister reports that she feels well. Denies chest pain, or abdominal pain. Reports some GU itching. Denies rash. She is able to carry a full conversation. Maintains good eye contact. Calm. CooperativeClear lungs anteriorlyRRRabd soft, colostomy bag in placeNo pitting edemaMoving all extremitiesDispo: awaiting long term placement   Rest of detailed data, assessment, and plan of care per the note above. Jone Baseman MDHospitalist MedicineMHB

## 2022-05-24 NOTE — Plan of Care
Plan of Care Overview/ Patient Status    1900 - 0700 A&Ox3. Disoriented to situation. VSS on RA. Denied pain, however pain obvious to LLE with repositioning, scheduled lidocaine patch applied. PEG patent, 35ml residual, TF given per orders, meds given orally. Drainage around PEG insertion site tan, creamy and serosang, site cleansed and new dressing applied. Reported heartburn tonight, MD Fotjadhi notified, PRN Tums ordered and given x2, eventually w/positive effect. Ileostomy draining mushy stool. Purewick in place draining yellow urine. T&P'd as tolerated, pt often refuses. Appeared to rest comfortably overnight. Awaiting LTC. Hourly rounding performed and safety maintained. Please continue to follow POC. See flow sheets for complete assessment. Problem: Adult Inpatient Plan of CareGoal: Plan of Care ReviewOutcome: Interventions implemented as appropriateFlowsheets (Taken 05/24/2022 0213)Progress: no changePlan of Care Reviewed With: patient

## 2022-05-24 NOTE — Discharge Instructions
It was a pleasure taking care of you. Please bring this paperwork with you to your next doctors appointment. If you have any questions regarding your hospital stay, please call the Hospitalist Service at [phone] and ask for Dr. ---- or Milton Ferguson PA-C [facility] medical staff who helped take care of you).Please follow up with primary care physician;  Psychiatry ; Neurology; Endocrinology; Hematology; Rheumatology; GI

## 2022-05-25 LAB — BASIC METABOLIC PANEL
BKR ANION GAP: 11 (ref 7–17)
BKR BLOOD UREA NITROGEN: 13 mg/dL (ref 8–23)
BKR BUN / CREAT RATIO: 41.9 — ABNORMAL HIGH (ref 8.0–23.0)
BKR CALCIUM: 10.2 mg/dL (ref 8.8–10.2)
BKR CHLORIDE: 107 mmol/L (ref 98–107)
BKR CO2: 22 mmol/L (ref 20–30)
BKR CREATININE: 0.31 mg/dL — ABNORMAL LOW (ref 0.40–1.30)
BKR EGFR, CREATININE (CKD-EPI 2021): >60 mL/min/{1.73_m2} (ref >=60)
BKR GLUCOSE: 86 mg/dL (ref 70–100)
BKR POTASSIUM: 4.2 mmol/L (ref 3.3–5.3)
BKR SODIUM: 140 mmol/L (ref 136–144)

## 2022-05-25 LAB — CBC WITH AUTO DIFFERENTIAL
BKR WAM ABSOLUTE IMMATURE GRANULOCYTES.: 0.01 x 1000/ÂµL (ref 0.00–0.30)
BKR WAM ABSOLUTE LYMPHOCYTE COUNT.: 2.59 x 1000/ÂµL (ref 0.60–3.70)
BKR WAM ABSOLUTE NRBC (2 DEC): 0 x 1000/ÂµL (ref 0.00–1.00)
BKR WAM ANALYZER ANC: 4.33 x 1000/ÂµL (ref 2.00–7.60)
BKR WAM BASOPHIL ABSOLUTE COUNT.: 0.04 x 1000/ÂµL (ref 0.00–1.00)
BKR WAM BASOPHILS: 0.5 % (ref 0.0–1.4)
BKR WAM EOSINOPHIL ABSOLUTE COUNT.: 0.35 x 1000/ÂµL (ref 0.00–1.00)
BKR WAM EOSINOPHILS: 4.3 % (ref 0.0–5.0)
BKR WAM HEMATOCRIT (2 DEC): 33.6 % — ABNORMAL LOW (ref 35.00–45.00)
BKR WAM HEMOGLOBIN: 10.5 g/dL — ABNORMAL LOW (ref 11.7–15.5)
BKR WAM IMMATURE GRANULOCYTES: 0.1 % (ref 0.0–1.0)
BKR WAM LYMPHOCYTES: 32 % (ref 17.0–50.0)
BKR WAM MCH (PG): 27.1 pg (ref 27.0–33.0)
BKR WAM MCHC: 31.3 g/dL (ref 31.0–36.0)
BKR WAM MCV: 86.6 fL (ref 80.0–100.0)
BKR WAM MONOCYTE ABSOLUTE COUNT.: 0.78 x 1000/ÂµL (ref 0.00–1.00)
BKR WAM MONOCYTES: 9.6 % (ref 4.0–12.0)
BKR WAM MPV: 10.6 fL (ref 8.0–12.0)
BKR WAM NEUTROPHILS: 53.5 % (ref 39.0–72.0)
BKR WAM NUCLEATED RED BLOOD CELLS: 0 % (ref 0.0–1.0)
BKR WAM PLATELETS: 302 x1000/ÂµL (ref 150–420)
BKR WAM RDW-CV: 16.9 % — ABNORMAL HIGH (ref 11.0–15.0)
BKR WAM RED BLOOD CELL COUNT.: 3.88 M/ÂµL — ABNORMAL LOW (ref 4.00–6.00)
BKR WAM WHITE BLOOD CELL COUNT: 8.1 x1000/ÂµL (ref 4.0–11.0)

## 2022-05-25 MED ORDER — BANANA FLAKES-TRANSGALACTOOLIGOSACCHARIDE ORAL POWDER PACKET
Freq: Three times a day (TID) | ORAL | Status: DC
Start: 2022-05-25 — End: 2022-06-29
  Administered 2022-05-25 – 2022-06-18 (×51): via ORAL

## 2022-05-25 MED ORDER — BANANA FLAKES-TRANSGALACTOOLIGOSACCHARIDE ORAL POWDER PACKET
Freq: Every day | ORAL | Status: DC
Start: 2022-05-25 — End: 2022-05-25

## 2022-05-25 NOTE — Plan of Care
Plan of Care Overview/ Patient Status    0700-1900Pt is A&Ox3 dis to sit, reorientation provided, VSS on RA, denies pain, SOB, N/V,  ileostomy x2 left no output covered with dressing, right medium brown liquid stool, rosebud pink, QT&Rq2hr, oral care q4hours, independently performing. TF via peg as ordered, tolerated well, pills po with water tolerated well. +UO, Bilateral LE in podus boots, safety maintained.

## 2022-05-25 NOTE — Plan of Care
Plan of Care Overview/ Patient Status    1500-1900A&O x 4, lungs clear, on RA. PEG tube patent, flushes well with no residual. Received Jevity 1.5  one carton this evening. Dressing changed to PEG site, noted with small amount of purulent/tanned drainage. Ileostomies intact, right ileostomy output 25 cc of dark brown stool, no output from left ileostomy.  Denies pain. A x 2 T&R in bed. Heels offloaded.

## 2022-05-25 NOTE — Plan of Care
Plan of Care Overview/ Patient Status    1900 - 0700 A&Ox3. Disoriented to situation. VSS on RA. Reported moderate-severe pain to bottom of feet. Scheduled gabapentin and PRN tramadol given. Appeared to fall asleep shortly after administration. Pain also noted to b/l knees/feet with repositioning. Did refused nightly lidocaine however. PEG patent, no residual, TF given per orders, meds given orally. Ileostomy draining liquid stool. Purewick in place draining yellow urine. T&P'd as tolerated, pt often refuses. Appeared to rest comfortably overnight. Awaiting LTC. Hourly rounding performed and safety maintained. Please continue to follow POC. See flow sheets for complete assessment. Problem: Adult Inpatient Plan of CareGoal: Plan of Care ReviewOutcome: Interventions implemented as appropriateFlowsheets (Taken 05/25/2022 0348)Progress: no changePlan of Care Reviewed With: patient

## 2022-05-26 NOTE — Plan of Care
Plan of Care Overview/ Patient Status    0700-1900A&O: x3 d/o situation Cardiac: WDL Resp: WDL room air, denies SOBGI/GU: ileostomy. Incontinent of urine, purwick in place.Diet: regular and tube feed. jevity 1.5 given per orders.Skin: see flowsheets Pain: bliateral leg pain see mar for interventions Mobility: total Access:none Pills: whole Safety maintained, call bell in reach, bed alarm set, rounding continued.

## 2022-05-26 NOTE — Progress Notes
[facility] Medicine Progress NoteAttending Provider: Jone Baseman, MD Subjective                                                                              Subjective: York Spaniel that her feet were bothering her.  Offered no additional complaints Review of Allergies/Meds/Hx: Review of Allergies/Meds/Hx:I have reviewed the patient's: allergies, current scheduled medications and current prn medications Objective Objective: Vitals:I have reviewed the patient's current vital signs as documented in the patient's EMR.   and Last 24 hours: Temp:  [97.6 ?F (36.4 ?C)-98.2 ?F (36.8 ?C)] 98.1 ?F (36.7 ?C)Pulse:  [61-79] 61Resp:  [16] 16BP: (91-103)/(59-64) 91/59SpO2:  [97 %-99 %] 99 %I/O's:I have reviewed the patient's current I&O's as documented in the EMR.Gross Totals (Last 24 hours) at 05/25/2022 1432Last data filed at 05/25/2022 1230Intake 1439 ml Output 855 ml Net 584 ml Procedures:None Physical ExamCardiovascular:    Rate and Rhythm: Normal rate.    Heart sounds: Normal heart sounds. Pulmonary:    Effort: Pulmonary effort is normal. Abdominal:    General: Abdomen is flat.    Palpations: Abdomen is soft.    Comments: Soft brown stool in ileostomy bag  Musculoskeletal:       General: No swelling.    Right lower leg: No edema.    Left lower leg: No edema. Skin:   General: Skin is warm and dry. Neurological:    Mental Status: She is alert.  Labs:Last 24 hours: Recent Results (from the past 24 hour(s)) CBC auto differential  Collection Time: 05/25/22  4:32 AM Result Value Ref Range  WBC 8.1 4.0 - 11.0 x1000/?L  RBC 3.88 (L) 4.00 - 6.00 M/?L  Hemoglobin 10.5 (L) 11.7 - 15.5 g/dL  Hematocrit 29.56 (L) 21.30 - 45.00 %  MCV 86.6 80.0 - 100.0 fL  MCH 27.1 27.0 - 33.0 pg  MCHC 31.3 31.0 - 36.0 g/dL  RDW-CV 86.5 (H) 78.4 - 15.0 %  Platelets 302 150 - 420 x1000/?L  MPV 10.6 8.0 - 12.0 fL  Neutrophils 53.5 39.0 - 72.0 %  Lymphocytes 32.0 17.0 - 50.0 %  Monocytes 9.6 4.0 - 12.0 %  Eosinophils 4.3 0.0 - 5.0 %  Basophil 0.5 0.0 - 1.4 %  Immature Granulocytes 0.1 0.0 - 1.0 %  nRBC 0.0 0.0 - 1.0 %  ANC(Abs Neutrophil Count) 4.33 2.00 - 7.60 x 1000/?L  Absolute Lymphocyte Count 2.59 0.60 - 3.70 x 1000/?L  Monocyte Absolute Count 0.78 0.00 - 1.00 x 1000/?L  Eosinophil Absolute Count 0.35 0.00 - 1.00 x 1000/?L  Basophil Absolute Count 0.04 0.00 - 1.00 x 1000/?L  Absolute Immature Granulocyte Count 0.01 0.00 - 0.30 x 1000/?L  Absolute nRBC 0.00 0.00 - 1.00 x 1000/?L Basic metabolic panel  Collection Time: 05/25/22  4:32 AM Result Value Ref Range  Sodium 140 136 - 144 mmol/L  Potassium 4.2 3.3 - 5.3 mmol/L  Chloride 107 98 - 107 mmol/L  CO2 22 20 - 30 mmol/L  Anion Gap 11 7 - 17  Glucose 86 70 - 100 mg/dL  BUN 13 8 - 23 mg/dL  Creatinine 6.96 (L) 2.95 - 1.30 mg/dL  Calcium 28.4 8.8 - 13.2 mg/dL  BUN/Creatinine Ratio 44.0 (H)  8.0 - 23.0  eGFR (Creatinine) >60 >=60 mL/min/1.42m2 Diagnostics:No new radiology.ECG/Tele Events: No ECG ordered today Assessment Assessment: Patient is a 66yo f w/PMH of RA, diverticular perforation, 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 Subsequently in 12/22-found to have abdominal abscess drained-G-tube placement-after Ethics involvement for nutrition; went to a STR facility in [place] in 01/2022-brought back to [facility] in 02/11/22 due to ongoing c/f encephalopathy and FTT observed by her sister, requested for LTACH. Underwent MRI brain/spine; LP; paraneoplastic autonomic encephalopathy; evaluated by neuro, OP-EMG advised. Awaiting title-19. Voices a concern for feet discomfort today.  No obvious deformity. If discomfort continues can get imaging, labs to eval for neuropathy/goutNo acute events in last 24 hours ? Plan Plan: Non-occlusive DVT despite on eliquis for previous DVTAppreciate prior hematology involvementCont. Lovenox 80mg  bid?B/L Knee pain w/hx of RA Pain likely due to RA vs contractures due to inactivity?Appreciate Rheumatology involvementxr-w/DJDCont. Prednisone at 5mg  daily dosing indefinitelyNoted improvement in pain w/steroidsCont. Tramadol prn and lidoderm patchNeeds PT/OT to work with her frequentlyConsider OP MTx if functional status improves ?Acute encephalopathy-improvingNo organic etiology identified wnl MRI brain/spineLP wnlParaneoplastic and autoimmune panels negativeEMG recommended by neuro-declined by patient-no add'l intervention advisedCont. Gabapentin?Perforated diverticulum s/p right hemicolectomy w/end ileostomyC/b fluid abscess requiring [place] def. Cont. Iron repletion?Dysphagia-seen by [employer]. Modified dietCont. G-tube feeds?Chronic pain due to polyneuropathyCont. Gabapentin and lidocaine?HLDCont. Crestor and niacin?PPXLovenox 80mg  bidFEN G tube feeds with trayCODE / FULL CODE Med recc Performed at time of admission Dispo:Awaiting T-19, had conservatorship hearing on 05/01/22.  Awaiting LTC placement once T-19 is secured. PT last saw pt 6/1.  They recc SNF for LT placement.  Will work with pt 2x a week while here. Poor endurance, deconditioned. Billing code submitted :  [phone] Electronically Signed:Angela M. Vonna Kotyk, PAPlease contact via [facility] 05/25/2022, 2:38 Hospitalist Attending AddendumStatement: ?I have seen and examined [name]  Independently around 10am on May 25 2022. I have discussed the case in detail with Kallie Edward PA.? I have reviewed the relevant laboratory and radiology data, as well as other relevant past medical history in the chart. I agree with the findings, physical exam, assessment and the plan of care as documented in the above note, with the following supplemental comments and with the relevant changes mentioned in my note. ??Briefly, Ms Woodhead reports that she woke up with some pain in her feet. She is now resting her feet on pillows. Also reports stiff back?EOMI, MMM, maintains good eye contactClear lungs anteriorlyRRRabd soft, colostomy bag in placeNo pitting edemaMoving all extremitiesGood discomfort- she has good range of motion of ankles. No signs of infection in the feet- continue to monitor?Dispo: awaiting long term placement ???Rest of detailed data, assessment, and plan of care per the note above. ??Jone Baseman MDHospitalist MedicineMHB

## 2022-05-26 NOTE — Plan of Care
Plan of Care Overview/ Patient Status    2300 - 0700 A&Ox3. Disoriented to situation. VSS on RA. Denied pain, but pain obvious to BLE w/repositioning, improved w/rest. Ileostomy intact. Appeared to rest comfortably overnight. Awaiting LTC. Hourly rounding performed and safety maintained. Please continue to follow POC. See flow sheets for complete assessment. Problem: Adult Inpatient Plan of CareGoal: Plan of Care ReviewOutcome: Interventions implemented as appropriateFlowsheetsTaken 05/26/2022 0308Plan of Care Reviewed With: patientTaken 05/25/2022 0348Progress: no change

## 2022-05-26 NOTE — Progress Notes
[facility] HealthMedicine Progress NoteAttending Provider: Jone Baseman, MDSubjective: Patient seen and examined around 11:50am on June 5 2023She reports that she did not sleep very well at night. The foot pain is better today, no chest pain, no SOB. Review of Meds: Scheduled:Current Facility-Administered Medications Medication Dose Route Frequency Provider Last Rate Last Admin ? Banatrol Plus oral powder packet  1 packet Oral TID Jone Baseman, MD   1 packet at 05/26/22 [phone] ? camphor-menthoL (SARNA) lotion   Topical (Top) BID Manzon, Berkley Harvey, MD   Given at 05/26/22 [phone] ? enoxaparin (LOVENOX) injection 80 mg  80 mg Subcutaneous Q12H Abid, Bushra, MD   80 mg at 05/26/22 5409 ? famotidine (PEPCID) tablet 20 mg  20 mg Oral BID Pryor Ochoa, MD   20 mg at 05/26/22 0840 ? ferrous gluconate (FERGON) tablet 324 mg  324 mg Oral BID WC Pryor Ochoa, MD   324 mg at 05/26/22 0841 ? folic acid (FOLVITE) tablet 1 mg  1 mg Oral Daily Pryor Ochoa, MD   1 mg at 05/26/22 0841 ? gabapentin (NEURONTIN) capsule 100 mg  100 mg Oral TID Marlon Pel, MD   100 mg at 05/26/22 0845 ? lidocaine (LIDODERM) 5 % 1 patch  1 patch Transdermal Q24H Simeon Craft, MD   1 patch at 05/25/22 2056 ? lidocaine (LIDODERM) 5 % 2 patch  2 patch Transdermal Q24H Milton Ferguson, PA   2 patch at 05/25/22 1401 ? melatonin tablet 3 mg  3 mg Oral Nightly Pryor Ochoa, MD   3 mg at 05/25/22 2058 ? niacin Immediate Release tablet 100 mg  100 mg Oral Nightly Pryor Ochoa, MD   100 mg at 05/25/22 2058 ? nystatin (MYCOSTATIN) powder 1 Application  1 Application Topical (Top) BID Henriette Combs, MD   1 Application at 05/26/22 0845 ? predniSONE (DELTASONE) tablet 5 mg  5 mg Oral Daily Eliberto Ivory, MD   5 mg at 05/26/22 0841 ? pyridoxine (vitamin B6) (B-6) tablet 100 mg  100 mg Oral Daily Pryor Ochoa, MD   100 mg at 05/26/22 0840 ? rosuvastatin (CRESTOR) tablet 10 mg  10 mg Per G Tube Nightly Dorisann Frames, MD   10 mg at 05/25/22 2058 ? sodium chloride 0.9 % flush 3 mL  3 mL IV Push Q8H Dorisann Frames, MD   3 mL at 05/26/22 8119 ? thiamine (VITAMIN B1) tablet 100 mg  100 mg Oral BID Pryor Ochoa, MD   100 mg at 05/26/22 0845 PRN: acetaminophen, calcium carbonate, dextrose (GLUCOSE) oral liquid 15 g **OR** fruit juice **OR** skim milk, dextrose (GLUCOSE) oral liquid 30 g **OR** fruit juice, dextrose injection, dextrose injection, gabapentin, glucagon, ondansetron **OR** ondansetron (ZOFRAN) IV Push, polyethylene glycol, sodium chloride, traMADoLContinuous Infusions: Objective: Vitals:Temp:  [97.5 ?F (36.4 ?C)-98.7 ?F (37.1 ?C)] 97.5 ?F (36.4 ?C)Pulse:  [72-73] 73Resp:  [16-20] 18BP: (100-118)/(64-77) 118/77SpO2:  [97 %-100 %] 100 %Device (Oxygen Therapy): room airI/O:Gross Totals (Last 24 hours) at 05/26/2022 1421Last data filed at 05/26/2022 1200Intake 1077 ml Output 1050 ml Net 27 ml Physical Exam:	GEN: NAD, alert, answering simple questionsHEENT: MMM, sclera non-icteric, EOMIPULM: breathing comfortable on room airCV: RRRABD: soft, PEG tube in place without discharge, ostomy in place with brown stoolEXT: no pitting edemaNEURO: moving all extremitiesLabs:Recent Labs Lab 05/30/230420 06/04/230432 WBC 7.6 8.1 HGB 10.1* 10.5* HCT 33.10* 33.60* PLT 340 302  Recent Labs Lab 05/30/230420 06/04/230432 NEUTROPHILS 51.0 53.5  Recent Labs Lab 05/30/230420 06/04/230432 NA 141 140 K 3.7  4.2 CL 108* 107 CO2 24 22 BUN 12 13 CREATININE 0.32* 0.31* GLU 85 86 ANIONGAP 9 11  Recent Labs Lab 05/30/230412 05/30/230420 06/04/230432 CALCIUM  --  9.9 10.2 MG 2.0  --   --  PHOS 2.4  --   --   No results for input(s): ALT, AST, ALKPHOS, BILITOT, BILIDIR in the last 168 hours. No results for input(s): PTT, LABPROT, INR in the last 168 hours. FINGERSTICKS: Recent Labs Lab 05/30/230420 06/04/230432 GLU 85 86 Micro:Lab Results Component Value Date  LABBLOO No Growth after 5 days of incubation 04/05/2022  LABBLOO No Growth after 5 days of incubation 04/05/2022  LABBLOO No Growth after 5 days of incubation 04/02/2022  LABBLOO No Growth after 5 days of incubation 04/02/2022  LABBLOO No Growth after 5 days of incubation 02/12/2022  LABBLOO No Growth after 5 days of incubation 02/12/2022 Diagnostics:US lower extremities May 22 2023Nonocclusive acute deep venous thrombus is noted within the right popliteal vein and tibioperoneal trunk.Assessment: 66 yo F with RA, diverticular perforation, 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 In Dec 2022, found to have abdominal abscess, had G tube placed for nutrition after Ethics was involved, and a STR facility was found in [place]. She was brought back to [facility] Feb 11 2022 due to ongoing encephalopathy and failure to thrive by sister, and the request was made for long term care family. She has had extensive work up for encephalopathy, including MRI brain, L spine, paraneoplastic work up. No organic etiology identified. 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

## 2022-05-26 NOTE — Other
A wound consult was placed to resolve the right gluteal wound as it has healed/resurfaced.  This does not require a wound consult- please resolve the LDA if the wound is healed.Communicated to covering RN Dahlia Client, consult completed.Oneida Arenas RN, CWONMH # [phone]/5/20238:36 AM

## 2022-05-26 NOTE — Plan of Care
Inpatient Physical Therapy Progress Note IP Adult PT Eval/Treat - 05/26/22 0850    Date of Visit / Treatment  Date of Visit / Treatment 05/26/22   Note Type Progress Note   Progress Report Due 06/02/22   End Time 0850    General Information  Subjective I'm in trouble!   Other Providers OT d/t case complexity and incr need for assist   General Observations Pt received supine in bed; on RA; NAD; ileostomy; purwick; RN cleared   Precautions/Limitations Fall Precautions;Bed alarm    Weight Bearing Status  Weight Bearing Status Comments WBAT BLE    Vital Signs and Orthostatic Vital Signs  Vital Signs Vital Signs Stable   Vital Signs Free text RA; NAD    Pain/Comfort  Pain Comment (Pre/Post Treatment Pain) No c/o pain at rest; c/o unquantified BLE pain with mobilization; RN awareand repositioned for comfort    Patient Coping  Observed Emotional State accepting;cooperative   Verbalized Emotional State acceptance    Cognition  Overall Cognitive Status WFL   Orientation Level Oriented to person;Oriented to place;Oriented to time;Disoriented to situation   Level of Consciousness alert;confused   Following Commands Follows one step commands with increased time;Follows one step commands with repetition   Personal Safety / Judgment Fall risk;Requires supervision with mobility   Cognition Comments Pleasant; confused at times    Range of Motion  Range of Motion Examination WFL except   Range of Motion Comments Tight B HM lacking ~ 20 deg full ext; lack B ankle DF; RUE more limited than LUE, yet WFL    Manual Muscle Testing  Manual Muscle Testing Comments + Deconditioned    Skin Assessment  Skin Assessment See Nursing Documentation    Posture, Head/Trunk Alignment  Posture, Head/Trunk Alignment Retropulsive;Right lateral lean    Balance  Sitting Balance: Static  POOR+    Minimal assist to maintain static position with no Assistive Device   Sitting Balance: Dynamic  POOR     Moves through 1/4 to 1/2 ROM range with moderate assist to right self   Standing Balance: Static POOR-     Maximal assist to maintain static position with no Assistive Device   Standing Balance: Dynamic  POOR-    Unable to move from midline voluntarily, maximal assist to right self   Balance Assist Device Hand held assist   Balance Skills Training Comment Ax2    Bed Mobility  Supine-to-Sit Independence/Assistance Level Moderate assist;Assist of 2   Supine-to-Sit Assist Device Head of bed elevated;Hand held assist;Draw pad   Sit-to-Supine Independence/Assistance Level Moderate assist;Assist of 2   Sit-to-Supine Assist Device Hand held assist;Head of bed elevated;Draw pad   Bed Mobility, Impairments ROM decreased;strength decreased;impaired balance;postural control impaired;sensory feedback impaired;pain   Bed Mobility Comments Less retropulsive and R lateral lean today while performing BLE therex; VCs and TCs to maintain seated upright    Sit-Stand Transfer Training  Sit-to-Stand Transfer Independence/Assistance Level Maximum assist;Assist of 2   Sit-to-Stand Transfer Assist Device Hand held assist   Stand-to-Sit Transfer Independence/Assistance Level Maximum assist;Assist of 2   Stand-to-Sit Transfer Assist Device Hand held assist   Transfer Safety Analysis Concerns losing balance backward;decreased weight-shifting ability;cues for hand placement   Transfer Safety Analysis Impairments impaired balance;pain;decreased ROM;decreased strength   Transfers were limited by: impaired motor function;pain   Sit-Stand Transfer Comments MaxA to stand; unable to achieve full upright despite cues; LLE buckling   Stand-Sit Transfer Comments Poor eccentrics    Gait Training  Gait Training Comments  NT d/t pain, unsteadiness, and weakness    Handoff Documentation  Handoff Patient in bed;Bed alarm;Patient instructed to call nursing for mobility;Discussed with nursing   Handoff Comments Pillow between knees for contracture management and skin breakdown    Endurance  Endurance Comments Poor+    PT- AM-PAC - Basic Mobility Screen- How much help from another person do you currently need.....  Turning from your back to your side while in a a flat bed without using rails? 1 - Total - Requires total assistance or cannot do it at all.   Moving from lying on your back to sitting on the side of a flat bed without using bed rails? 1 - Total - Requires total assistance or cannot do it at all.   Moving to and from a bed to a chair (including a wheelchair)? 1 - Total - Requires total assistance or cannot do it at all.   Standing up from a chair using your arms(e.g., wheelchair or bedside chair)? 1 - Total - Requires total assistance or cannot do it at all.   To walk in a hospital room? 1 - Total - Requires total assistance or cannot do it at all.   Climbing 3-5 steps with a railing? 1 - Total - Requires total assistance or cannot do it at all.   AMPAC Mobility Score 6   TARGET Highest Level of Mobility Mobility Level 2, Turn self in bed/bed activity/dependent transfer    ACTUAL Highest Level of Mobility Mobility Level 3, Sit on edge of bed    Therapeutic Exercise  Therapeutic Exercise Comments Reviewed and performed BLE seated EOB    Neuromuscular Re-education  Neuromuscular Re-education Comments Seated static/dynamic PC training; static standing PC training    Therapeutic Functional Activity  Therapeutic Functional Activity Comments Bed mobility; STS training; Pt edu    Clinical Impression  Follow up Assessment Pt seen for PT f/u. Continues to require mod/maxAx2 for functional mobility. Less retropulsion and R lateral lean seated EOB performing therex during today's session. Continues to be a heavy assist to stand with assist of 2. Will benefit from skilled services to improve gross functional ROM/strength, postural control/balance, and activity tolerance. Current PT d/c rec is SNF for LTC.   Criteria for Skilled Therapeutic Interventions Met yes    Patient/Family Stated Goals  Patient/Family Stated Goal(s) decrease pain;return to prior level of functioning    Frequency/Equipment Recommendations  PT Frequency 2x per week   What day of week is next treatment expected? Thursday   6/8  PT/PTA completing this assessment RL   Equipment Needs During Admission/Treatment No device    PT Recommendations for Inpatient Admission  Activity/Level of Assist dangle edge of bed;assist of 2   Positioning chair position of the bed;elevate heels;reposition frequently   Therapeutic Exercise ROM as tolerated   Other/Comments Pillow between knees in supine for contracture management and prevention of skin breakdown; encourage B knee extension in supine; encourage B podus boots or shorten bed to maintain 0 deg of ankle DF    Planned Treatment / Interventions  Plan for Next Visit Progress as tol   Training Treatment / Interventions Balance / Investment banker, operational;Endurance Training;Functional Personnel officer / Interventions Patient Education / Training    PT Discharge Summary  Physical Therapy Disposition Recommendation SNF for long term placement     Shanon Payor PT, DPTMHB: [phone]

## 2022-05-26 NOTE — Plan of Care
Inpatient Occupational Therapy Re-EvaluationDefault Flowsheet Data (most recent)   IP Adult OT Eval/Treat - 05/26/22 1424    Date of Visit / Treatment  Date of Visit / Treatment 05/26/22   Note Type Re-Evaluation   Progress Report Due 07/21/22   End Time 1424    General Information  Pertinent History Of Current Problem Per chart: 66 y.o. female with PMH of RA, recent prolonged hospitalization for perforated diverticulitis (s/p ex Lap/R hemicolectomy with end ileostomy). Admitted 2/24 for LTAC placement and persistent encephalopathy. Found to be febrile without clear infectious source found, fevers resolved. Extensive work-up of encephalopathy and leg weakness unremarkable. Course c/b elevated LFTs which resolved, likely 2/2 DILI from ceftriaxone or Zosyn. Stable and awaiting T19/LTC placement, but with ongoing significant hypercalcemia. Mental status appears improved   Subjective pt in bed on 9.7, agreeable to therapy session   Precautions/Limitations Fall Precautions;Bed alarm    Prior Level of Functioning/Social History  Additional Comments Pt was brought to [facility] by her family from [facility]. Her sister, [name] has been appointed Acupuncturist (Hearing 05/01/22) and awaiting court decree. Plan is SNF LTC once medically cleared. PLOF is unclear as she has had a prolonged hospital course    Vital Signs and Orthostatic Vital Signs  Vital Signs Vital Signs Stable    Pain/Comfort  Location #1 - PreTreatment Rating (Numbers Scale) --   pain with ROM of LE's   Patient Coping  Observed Emotional State accepting;cooperative    Cognition  Overall Cognitive Status Impaired   Cognition Comments pt alert and oriented x2-3, poor insight into reason for admission, length of stay and implications of prolonged bedrest and immobility. Able to follow commands and make needs known.    Vision/ Hearing  Vision Assessment Results No vision deficits noted    Range of Motion Range of Motion Comments bilateral knees lacking ~10-20 degrees of full extension, left ankle beginning plantar flexion contracture, lacking 20 degrees to neutral passively, BUE's WFL passively    Musculoskeletal  LUE Muscle Strength Grading --   3+/5 grossly  RUE Muscle Strength Grading --   shoulder 3-/5, elbow/grip 3/5  LLE Muscle Strength Grading --   hip 2/5, knee 3-/5, ankle 2/5  RLE Muscle Strength Grading --   hip 2/5, knee 3/5, ankle 3-/5   Sensory Assessment  Sensory Tests Results No sensory impairment noted    Skin Assessment  Skin Assessment See Nursing Documentation    Balance  Sitting Balance: Static  POOR+    Minimal assist to maintain static position with no Assistive Device   Sitting Balance: Dynamic  POOR     Moves through 1/4 to 1/2 ROM range with moderate assist to right self   Standing Balance: Static POOR-     Maximal assist to maintain static position with no Assistive Device   Standing Balance: Dynamic  POOR-    Unable to move from midline voluntarily, maximal assist to right self     AM-PAC - Daily Activity IP Short Form  Help needed from another person putting on/taking off regular lower body clothing 1 - Unable   Help needed from another person for bathing (incl. washing, rinsing, drying) 1 - Unable   Help needed from another person for toileting (incl. using toilet, bedpan, urinal) 1 - Unable   Help needed from another person putting on/taking off regular upper body clothing 2 - A Lot   Help needed from another person taking care of personal grooming such as brushing teeth 2 - A Lot  Help needed from another person eating meals 2 - A Lot   AM-PAC Daily Activity Raw Score (Total of rows above) 9   CMS Score (based on Raw Score - with G Code) 9 - 79.59% impaired     (G Code - CL)    Bed Mobility  Supine-to-Sit Independence/Assistance Level Moderate assist;Assist of 2   Supine-to-Sit Assist Device Head of bed elevated;Hand held assist    Sit-Stand Transfer Training  Sit-to-Stand Transfer Independence/Assistance Level Maximum assist;Assist of 2   Sit-to-Stand Transfer Assist Device Hand held assist   Sit-Stand Transfer Comments heavy assist required to briefly stand, LLE buckling, flexed hip/knees/trunk    Therapeutic Exercise  Therapeutic Exercise Comments able to participate in UE/LE therex at eob.    Clinical Impression  Follow up Assessment pt seen for re-eval on 9.7, presenting with gross deconditioning, decreased strength, impaired ROM and contractures, currently requiring assist of two for mobility and adls, pt will need STR at discharge.    Frequency/Equipment Recommendations  OT Frequency 2x per week   What day of week is next treatment expected? Wednesday   6/7   OT Discharge Summary  Disposition Recommendation Short Term Rehab

## 2022-05-26 NOTE — Plan of Care
Plan of Care Overview/ Patient Status    1500-1900 hrs  Pt A&Ox3-4, VSS, tolerating RA.   Pt resting in bed.   Purewick I place, leaking past.   , large incontinence.  Ostomy w/ + stool and flatus, appliance intact.  LLQ dsg CD&I old ostomy site.  Bolus TF a/o. Pt denies pain, BLE pain w/ movement, assist of 2 in bred, Offloaded heels and wedge support for foot drop.  Fall precautions w/ bed alarm in use.  Will monitor.  1900-2330 hrs  PT A&Ox3-4, VSS, tolerating RA.  Pt w/ Bolus TF and increased stool output via ileostomy, stool is liquid thick.  Appliance CD&I.  PEG tube dsg changed for moist drainage at insertions site.  Olds ostomy dsg changed.  Podus boots on and wedge at FOB for foot drop preventions. Lidocaine patches to Knee, hip Ns L ankle w/ good relief.  Barrier cream to gluteal fold, OTA, improving.  Assist of 2 in bed.   Fall preventions maintained.  Will monitor.

## 2022-05-27 NOTE — Plan of Care
[name]					Location: EP [id number] y.o., female				Attending: Jone Baseman, MD	Admit Date: 02/11/2022			[name] LOS: 105 days FOLLOW-UP NUTRITION ASSESSMENT Dietary Orders (From admission, onward)     Start     Ordered  04/24/22 1446  Diet Tube Feed With Tray  (Diet Tube Feed Panel)  DIET EFFECTIVE NOW      Comments: JEVITY 1.5 @ 1.5 carton bolus @9am  and 1 carton BID (5pm, 9pm) to total 3.5 cartons daily Question Answer Comment Diet Selection? Regular  Safety Tray? (Suicide/Homicide/Police Custody) Safety Tray - No  Adult Tube Feed Formulas: Jevity 1.5 (YNH,SRC)  Feeding Route: G-Tube  Initiate Nutrition Management Protocol (Yes/No?) Yes - Initiate Protocol    04/24/22 1446    No Known AllergiesANTHROPOMETRICS:Height:?70Admit wt:?79.4 kg (per SNF documentation.)Current wt:?73.8?kg BMI:?23?Dosing wt:  74.8 kg (normal BMI)?Additional wt information:Ht confirmed with pt: yes1-2+ edema noted per flowsheets. Wt increase of 2.1 kg noted since last nutrition assessment likely d/t fluid status. Will ctm wts. Epic wt hx:  Wt: 05/21/22 74.8 kg 12/13/18 98.6 kg ??ESTIMATED NUTRITION REQUIREMENTS:Kcal/day:?1870????????????????????(25 ?kcal/kg)Protein/day:?75-90 ?????????????(1-1.2gm/kg)Fluids/day:?1870 mL/d?(71mL/kcal) ONLY as medical and fluid status allows and only per team discretion. Needs based on:?Dosing Wt (74.8?kg), maintenance??NUTRITION ASSESSMENT: PT EMR reviewed for follow up assessment. Meds and labs reviewed.  Since last nutrition assessment pt has continued to tolerate po intake while TF is at goal. Per chart pt has been ordering an average of 1170 kcal with 41 g protein per day. Met with pt at bedside. Pt endorses a good appetite and reports she is having 3 meals @ with ranges 50-100% intake with no n/v/abd pain. Pt had no nutrition related questions. Suspect based on pt report of intake with the addition of TF pt is meeting nutrition needs. Nutrition will continue to monitor for the remainder of admission. ?Cultural/Religious/Ethnic Needs:?None?Food Allergies: NKFA?NUTRITION DIAGNOSIS:Severe malnutrition r/t depletion aeb moderate muscle and fat depletion.- continues?- if wt remains table and nutrition at supportive level would consider resolving malnutrition diagnosis. ?INTERVENTIONS/RECOMMENDATIONS: Meals and Snacks:-Continue with Regular trays-Encourage protein rich food sources with each tray ordered to better meet protein needsGoal:?po intake?for comfort given support provided by?goal?volume bolus feeds,?-continues?Enteral Nutrition:?	Continue?JEVITY 1.5?@ 1.5?carton bolus @9am ?and?1 carton?BID (5pm,?9pm)?to total?3.5?cartons daily-	Provides:?850mL, 1244 kcal,?53gm protein,? free waterGoal: ?Patient will receive >90% of goal volume TF/ TPN prior to next nutrition assessment-suspect met, continueDischarge Planning and Transfer of Nutrition Care:  Nutrition related discharge needs still being determined at this time, will continue to follow  ?MONITORING / EVALUATION:?Food/Nutrition-Related History:Food and Nutrient IntakeEnteral and Parenteral Nutrition Intake?Anthropometric Measurements:Weight ?Biochemical Data, Medical Tests and Procedures:Electrolyte and renal profileGlucose/endocrine profile?Nutrition-Focus Physical FindingsOverall findingsElectronically signed by Sherrilee Gilles, RD, June 6, 2023MHB- [phone] Please note: Nutrition is a consult only service on weekends and holidays.  Please enter a consult in EPIC if assistance is needed on a weekend or holiday or via M[facility] [phone] to contact the covering RD.

## 2022-05-27 NOTE — Plan of Care
Plan of Care Overview/ Patient Status    1900-0700:  Pt A&Ox3 d/o situation.  VSS on RA. Denies SOB, CP, N/V/D. Scheduled meds given per order. Ileostomy dressing in place.  Right sided Ileostomy +flatus and stool. Pt incontinent of urine, prn care provided, Purewick in place. PEG tube in place, dressing changed this shift. Bolus TF given 1 carton of Jevity 1.5.  Good PO intake. BLE elevated, podus boots in place. T&Rq2h. Pt resting in bed. Hourly rounding. Safety precautions maintained, bed alarm on, call bell in reach, will ctm and follow nursing poc.Problem: Adult Inpatient Plan of CareGoal: Plan of Care Review6/05/2022 0820 by Su Grand, RNOutcome: Interventions implemented as appropriateFlowsheets (Taken 05/27/2022 0206)Progress: improvingPlan of Care Reviewed With: patient6/05/2022 0820 by Su Grand, RNOutcome: Interventions implemented as appropriate

## 2022-05-27 NOTE — Plan of Care
Plan of Care Overview/ Patient Status    Problem: Adult Inpatient Plan of CareGoal: Readiness for Transition of CareOutcome: Interventions implemented as appropriate CM spoke with pt's sister/conservator, [name], to discuss T19 status. She confirmed that T19 was turned in March 2023 but because she was not conservator she could not get the financial paperwork that goes with it.  Conservatorship was completed May 12th and she has a few pieces left to get.  She has appt with [facility] on June 23th.  We discuss the lack of bed offers as well.  Care Manager will continue to follow with team.Jeren Dufrane Hinda Lenis, MSW/CMCare ManagementYale Saint Cl[phone]

## 2022-05-27 NOTE — Progress Notes
[facility] HealthMedicine Progress NoteAttending Provider: Jone Baseman, MDSubjective: Patient seen and examined around 9:15am on June 6 2023Slept better last night. Continues to have pain in feet on and off. No abdominal pain, no chest pain, no problem breathingDiscussed bedside chair with RN, but she has problem with sliding off the chair so they did not recommend. Review of Meds: Scheduled:Current Facility-Administered Medications Medication Dose Route Frequency Provider Last Rate Last Admin ? Banatrol Plus oral powder packet  1 packet Oral TID Jone Baseman, MD   1 packet at 05/27/22 [phone] ? camphor-menthoL (SARNA) lotion   Topical (Top) BID Manzon, Berkley Harvey, MD   Given at 05/27/22 [phone] ? enoxaparin (LOVENOX) injection 80 mg  80 mg Subcutaneous Q12H Abid, Bushra, MD   80 mg at 05/27/22 0645 ? famotidine (PEPCID) tablet 20 mg  20 mg Oral BID Pryor Ochoa, MD   20 mg at 05/27/22 0950 ? ferrous gluconate (FERGON) tablet 324 mg  324 mg Oral BID WC Pryor Ochoa, MD   324 mg at 05/27/22 0950 ? folic acid (FOLVITE) tablet 1 mg  1 mg Oral Daily Pryor Ochoa, MD   1 mg at 05/27/22 0950 ? gabapentin (NEURONTIN) capsule 100 mg  100 mg Oral TID Marlon Pel, MD   100 mg at 05/27/22 0950 ? lidocaine (LIDODERM) 5 % 1 patch  1 patch Transdermal Q24H Simeon Craft, MD   1 patch at 05/26/22 2159 ? lidocaine (LIDODERM) 5 % 2 patch  2 patch Transdermal Q24H Milton Ferguson, [place]   2 patch at 05/26/22 1514 ? melatonin tablet 3 mg  3 mg Oral Nightly Pryor Ochoa, MD   3 mg at 05/26/22 2158 ? niacin Immediate Release tablet 100 mg  100 mg Oral Nightly Pryor Ochoa, MD   100 mg at 05/26/22 2158 ? nystatin (MYCOSTATIN) powder 1 Application  1 Application Topical (Top) BID Henriette Combs, MD   1 Application at 05/27/22 [phone] ? predniSONE (DELTASONE) tablet 5 mg  5 mg Oral Daily Eliberto Ivory, MD   5 mg at 05/27/22 0950 ? pyridoxine (vitamin B6) (B-6) tablet 100 mg  100 mg Oral Daily Pryor Ochoa, MD   100 mg at 05/27/22 0950 ? rosuvastatin (CRESTOR) tablet 10 mg  10 mg Per G Tube Nightly Dorisann Frames, MD   10 mg at 05/26/22 2158 ? sodium chloride 0.9 % flush 3 mL  3 mL IV Push Q8H Dorisann Frames, MD   3 mL at 05/26/22 8119 ? thiamine (VITAMIN B1) tablet 100 mg  100 mg Oral BID Pryor Ochoa, MD   100 mg at 05/27/22 0950 PRN: acetaminophen, calcium carbonate, dextrose (GLUCOSE) oral liquid 15 g **OR** fruit juice **OR** skim milk, dextrose (GLUCOSE) oral liquid 30 g **OR** fruit juice, dextrose injection, dextrose injection, gabapentin, glucagon, ondansetron **OR** ondansetron (ZOFRAN) IV Push, polyethylene glycol, sodium chloride, traMADoLContinuous Infusions: Objective: Vitals:Temp:  [97.3 ?F (36.3 ?C)-97.7 ?F (36.5 ?C)] 97.7 ?F (36.5 ?C)Pulse:  [70-73] 70Resp:  [18-19] 19BP: (99-110)/(66-70) 107/68SpO2:  [96 %-99 %] 99 %Device (Oxygen Therapy): room airI/O:Gross Totals (Last 24 hours) at 05/27/2022 1238Last data filed at 05/27/2022 1100Intake 1272 ml Output 1275 ml Net -3 ml Physical Exam:	GEN: NAD, alert, answering simple questionsHEENT: MMM, sclera non-icteric, EOMIPULM: breathing comfortable on room airCV: RRRABD: soft, PEG tube in place without discharge, ostomy in place with brown stoolEXT: no pitting edemaNEURO: moving all extremitiesLabs:Recent Labs Lab 06/04/230432 WBC 8.1 HGB 10.5* HCT 33.60* PLT 302  Recent Labs Lab 06/04/230432 NEUTROPHILS 53.5  Recent Labs Lab 06/04/230432 NA 140 K 4.2 CL 107 CO2 22 BUN 13 CREATININE 0.31* GLU 86 ANIONGAP 11  Recent Labs Lab 06/04/230432 CALCIUM 10.2  No results for input(s): ALT, AST, ALKPHOS, BILITOT, BILIDIR in the last 168 hours. No results for input(s): PTT, LABPROT, INR in the last 168 hours. FINGERSTICKS: Recent Labs Lab 06/04/230432 GLU 86 Micro:Lab Results Component Value Date  LABBLOO No Growth after 5 days of incubation 04/05/2022  LABBLOO No Growth after 5 days of incubation 04/05/2022  LABBLOO No Growth after 5 days of incubation 04/02/2022  LABBLOO No Growth after 5 days of incubation 04/02/2022  LABBLOO No Growth after 5 days of incubation 02/12/2022  LABBLOO No Growth after 5 days of incubation 02/12/2022 Diagnostics:US lower extremities May 22 2023Nonocclusive acute deep venous thrombus is noted within the right popliteal vein and tibioperoneal trunk.Assessment: 66 yo F with RA, diverticular perforation, 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 In Dec 2022, found to have abdominal abscess, had G tube placed for nutrition after Ethics was involved, and a STR facility was found in [place]. She was brought back to [facility] Feb 11 2022 due to ongoing encephalopathy and failure to thrive by sister, and the request was made for long term care by family. She has had extensive work up for encephalopathy, including MRI brain, L spine, paraneoplastic work up. No organic etiology identified. 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 Next set around 06/01/2022# Dispo: pending long term placementSigned:Jared Cahn Chilton Si, MD MScHospitalist Medicine AttendingMHB6/05/2022

## 2022-05-28 ENCOUNTER — Inpatient Hospital Stay: Admit: 2022-05-28 | Payer: PRIVATE HEALTH INSURANCE

## 2022-05-28 LAB — CBC WITH AUTO DIFFERENTIAL
BKR WAM ABSOLUTE IMMATURE GRANULOCYTES.: 0.02 x 1000/ÂµL (ref 0.00–0.30)
BKR WAM ABSOLUTE LYMPHOCYTE COUNT.: 1.6 x 1000/ÂµL (ref 0.60–3.70)
BKR WAM ABSOLUTE NRBC (2 DEC): 0 x 1000/ÂµL (ref 0.00–1.00)
BKR WAM ANALYZER ANC: 4.45 x 1000/ÂµL (ref 2.00–7.60)
BKR WAM BASOPHIL ABSOLUTE COUNT.: 0.03 x 1000/ÂµL (ref 0.00–1.00)
BKR WAM BASOPHILS: 0.4 % (ref 0.0–1.4)
BKR WAM EOSINOPHIL ABSOLUTE COUNT.: 0.19 x 1000/ÂµL (ref 0.00–1.00)
BKR WAM EOSINOPHILS: 2.8 % (ref 0.0–5.0)
BKR WAM HEMATOCRIT (2 DEC): 38.3 % (ref 35.00–45.00)
BKR WAM HEMOGLOBIN: 11.6 g/dL — ABNORMAL LOW (ref 11.7–15.5)
BKR WAM IMMATURE GRANULOCYTES: 0.3 % (ref 0.0–1.0)
BKR WAM LYMPHOCYTES: 23.4 % (ref 17.0–50.0)
BKR WAM MCH (PG): 26.3 pg — ABNORMAL LOW (ref 27.0–33.0)
BKR WAM MCHC: 30.3 g/dL — ABNORMAL LOW (ref 31.0–36.0)
BKR WAM MCV: 86.8 fL (ref 80.0–100.0)
BKR WAM MONOCYTE ABSOLUTE COUNT.: 0.55 x 1000/ÂµL (ref 0.00–1.00)
BKR WAM MONOCYTES: 8 % (ref 4.0–12.0)
BKR WAM MPV: 10.6 fL (ref 8.0–12.0)
BKR WAM NEUTROPHILS: 65.1 % (ref 39.0–72.0)
BKR WAM NUCLEATED RED BLOOD CELLS: 0 % (ref 0.0–1.0)
BKR WAM PLATELETS: 301 x1000/ÂµL (ref 150–420)
BKR WAM RDW-CV: 17.2 % — ABNORMAL HIGH (ref 11.0–15.0)
BKR WAM RED BLOOD CELL COUNT.: 4.41 M/ÂµL (ref 4.00–6.00)
BKR WAM WHITE BLOOD CELL COUNT: 6.8 x1000/ÂµL (ref 4.0–11.0)

## 2022-05-28 LAB — SEDIMENTATION RATE (ESR): BKR SEDIMENTATION RATE, ERYTHROCYTE: 72 mm/h — ABNORMAL HIGH (ref 0–20)

## 2022-05-28 LAB — C-REACTIVE PROTEIN     (CRP): BKR C-REACTIVE PROTEIN, HIGH SENSITIVITY: 12 mg/L — ABNORMAL HIGH

## 2022-05-28 NOTE — Progress Notes
[facility] HealthMedicine Progress NoteAttending Provider: Haskell Riling, MDSubjective: Spoke with patient at bedside with the rest of medical teamShe reports that her primary chief complaint is her ongoing foot pain (mostly in her ankles) ; worse in her left foot, states that her socks are bothering her and that it appears to worsen with movementDenies any other chief complaintReview of Meds: Scheduled:Current Facility-Administered Medications Medication Dose Route Frequency Provider Last Rate Last Admin ? Banatrol Plus oral powder packet  1 packet Oral TID Jone Baseman, MD   1 packet at 05/28/22 [phone] ? camphor-menthoL (SARNA) lotion   Topical (Top) BID Manzon, Berkley Harvey, MD   Given at 05/28/22 [phone] ? enoxaparin (LOVENOX) injection 80 mg  80 mg Subcutaneous Q12H Abid, Bushra, MD   80 mg at 05/28/22 0558 ? famotidine (PEPCID) tablet 20 mg  20 mg Oral BID Pryor Ochoa, MD   20 mg at 05/28/22 0848 ? ferrous gluconate (FERGON) tablet 324 mg  324 mg Oral BID WC Pryor Ochoa, MD   324 mg at 05/28/22 0847 ? folic acid (FOLVITE) tablet 1 mg  1 mg Oral Daily Pryor Ochoa, MD   1 mg at 05/28/22 0847 ? gabapentin (NEURONTIN) capsule 100 mg  100 mg Oral TID Marlon Pel, MD   100 mg at 05/28/22 0846 ? lidocaine (LIDODERM) 5 % 1 patch  1 patch Transdermal Q24H Simeon Craft, MD   1 patch at 05/27/22 2150 ? lidocaine (LIDODERM) 5 % 2 patch  2 patch Transdermal Q24H Milton Ferguson, PA   2 patch at 05/27/22 1535 ? melatonin tablet 3 mg  3 mg Oral Nightly Pryor Ochoa, MD   3 mg at 05/27/22 2144 ? niacin Immediate Release tablet 100 mg  100 mg Oral Nightly Pryor Ochoa, MD   100 mg at 05/27/22 2144 ? nystatin (MYCOSTATIN) powder 1 Application  1 Application Topical (Top) BID Henriette Combs, MD   1 Application at 05/28/22 [phone] ? predniSONE (DELTASONE) tablet 5 mg  5 mg Oral Daily Eliberto Ivory, MD   5 mg at 05/28/22 0847 ? pyridoxine (vitamin B6) (B-6) tablet 100 mg  100 mg Oral Daily Pryor Ochoa, MD   100 mg at 05/28/22 0846 ? rosuvastatin (CRESTOR) tablet 10 mg  10 mg Per G Tube Nightly Dorisann Frames, MD   10 mg at 05/27/22 2144 ? sodium chloride 0.9 % flush 3 mL  3 mL IV Push Q8H Dorisann Frames, MD   3 mL at 05/26/22 5621 ? thiamine (VITAMIN B1) tablet 100 mg  100 mg Oral BID Pryor Ochoa, MD   100 mg at 05/28/22 0847 PRN: acetaminophen, calcium carbonate, dextrose (GLUCOSE) oral liquid 15 g **OR** fruit juice **OR** skim milk, dextrose (GLUCOSE) oral liquid 30 g **OR** fruit juice, dextrose injection, dextrose injection, gabapentin, glucagon, ondansetron **OR** ondansetron (ZOFRAN) IV Push, polyethylene glycol, sodium chloride, traMADoLContinuous Infusions: Objective: Vitals:Temp:  [97.6 ?F (36.4 ?C)-99.5 ?F (37.5 ?C)] 99.1 ?F (37.3 ?C)Pulse:  [63-81] 63Resp:  [17-20] 20BP: (101-117)/(63-76) 103/65SpO2:  [97 %] 97 %Device (Oxygen Therapy): room airI/O:Gross Totals (Last 24 hours) at 05/28/2022 1302Last data filed at 05/28/2022 0900Intake 1244 ml Output 850 ml Net 394 ml Physical Exam:	GEN: pleasant and conversational laying in bed not in any acute distress HEENT: MMM, sclera non-icteric, EOMIPULM: breathing comfortable on room air, lungs sound clear on anterior lung fieldsCV: RRRABD: soft, PEG tube in place without discharge, ostomy in place with brown stoolEXT: no pitting edema ; endorses left foot tendernessMCP with mild  subluxationMinimal warmth of L lateral ankleNEURO: moving all extremities without difficultyLabs:No new labs until 6/11 (patient on weekly labs)Diagnostics:US lower extremities May 22 2023Nonocclusive acute deep venous thrombus is noted within the right popliteal vein and tibioperoneal trunk.Assessment: 66 yo F with RA, diverticular perforation, 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 In Dec 2022, she was found to have abdominal abscess and had G tube placed for nutrition after Ethics was involved, and a STR facility was found in [place]. She was brought back to [facility] Feb 11 2022 due to ongoing encephalopathy and failure to thrive by sister, and the request was made for long term care by family. She has had extensive work up for encephalopathy, including MRI brain, L spine, paraneoplastic work up. No organic etiology identified. 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 Next set around 6/11/2023Diet: Diet tube feed with tray (Jevity 1.5); document tube feeds q4h# Dispo: pending long term placementSigned:Jerral Ralph, MDPsychiatry PGY-1Attending attestation to follow6/06/2022

## 2022-05-28 NOTE — Plan of Care
Plan of Care Overview/ Patient Status    [name]&Ox3, vss, no complaint of pain. Peg dressing changed. Scant dried drainage noted. TF administered as ordered. Tolerating well. PO intake adequate. Encouragement provided. Colostomy bag intact. Flatus and stool present.  T&P q 2hrs, safety maintained bed alarm on, call bell in reach.

## 2022-05-28 NOTE — Plan of Care
Plan of Care Overview/ Patient Status    1900 - 0700 A&Ox3. Disoriented to situation. VSS on RA. Denied pain, however pain obvious to BLE with repositioning, scheduled lidocaine patch applied to LLE. PEG patent, 10ml residual, TF given per orders, meds given orally. Ileostomy draining liquid stool & flatus, appliance leaking and changed. Purewick in place draining yellow urine. T&P'd as tolerated, pt often refuses. Appeared to rest comfortably overnight. Awaiting LTC. Hourly rounding performed and safety maintained. Please continue to follow POC. See flow sheets for complete assessment. Problem: Adult Inpatient Plan of CareGoal: Plan of Care ReviewOutcome: Interventions implemented as appropriateFlowsheets (Taken 05/27/2022 2332)Progress: no changePlan of Care Reviewed With: patient

## 2022-05-28 NOTE — Plan of Care
Plan of Care Overview/ Patient Status    Medical Social Work Follow Up  [employer] Most Recent Value Admission Information  Document Type Patient Experience/Rounding Reason for Inability to Assess --  [Patient mental status is altered] (For Inpatient/ED Only) Prior psychosocial assessment has been documented within this hospitalization No (For Inpatient/ED Only) Prior psychosocial assessment has been documented within 30 days of this hospitalization No Reason for Current Social Work Involvement Support/Coping Source of Information Patient, Medical Team Family/Caregiver Comment [employer] Medical Team Comment [facility] Comment [place] [address] Record Reviewed Yes Level of Care Inpatient Psychosocial issues requiring intervention Family relationship conflicts Psychosocial interventions 10 minutes spent on the phone with Cletis Athens to discuss concerns of conflict regarding the relationship between Janaye and her sister. 15 minutes spent face to face with Davy. I reminded Annai of my role and the reason for the consult. Reighlynn presented as pleasant and cooperative. She was easily engaged and reported that she was just frustrated with her sister and an argument that they had earlier that day. She stated that her sister has been a great support and has helped her a lot. Heli explained that she and her sister have always had the kind of relationship where they argue one minute and then make up later. I provided support and empathetic listening. Enslee reported that she appreciated Wendy's concerns but that everything is fine and there is no need to worry. She stated that she is continuing to work with her sister to get all the things that she needs to help her get placement. I encouraged Nyx to reach out to Social Work should she have any concerns or needs and she agreed. Nevaen appreciated my visit and I thanked her for her time. Collaborations [facility] Specific referrals to enhance community supports (include existing and new resources) None Handoff Required? No Next Steps/Plan (including hand-off): I will continue to remain involved for support as needed. Signature: Leim Fabry, LCSW Contact Information: [phone]

## 2022-05-29 ENCOUNTER — Encounter: Admit: 2022-05-29 | Payer: PRIVATE HEALTH INSURANCE | Attending: Family | Primary: Internal Medicine

## 2022-05-29 ENCOUNTER — Encounter: Admit: 2022-05-29 | Payer: PRIVATE HEALTH INSURANCE | Primary: Internal Medicine

## 2022-05-29 MED ORDER — LIDOCAINE 5 % ADHESIVE PATCH
5 % | TRANSDERMAL | Status: DC
Start: 2022-05-29 — End: 2022-05-29

## 2022-05-29 MED ORDER — LIDOCAINE 5 % ADHESIVE PATCH
5 % | TRANSDERMAL | Status: DC
Start: 2022-05-29 — End: 2022-07-09

## 2022-05-29 MED ORDER — METHYL SALICYLATE 15 %-MENTHOL 10 % TOPICAL CREAM
15-10 % | Freq: Two times a day (BID) | TOPICAL | Status: DC | PRN
Start: 2022-05-29 — End: 2022-06-30

## 2022-05-29 NOTE — Plan of Care
Plan of Care Overview/ Patient Status    [name]&Ox3, vss, no complaint of pain. Peg dressing changed. Scant dried drainage noted. TF administered as ordered. Tolerating well. PO intake adequate. Encouragement provided. Colostomy bag intact. Flatus and stool present.  T&P q 2hrs, safety maintained bed alarm on, call bell in reach.

## 2022-05-29 NOTE — Progress Notes
[facility] HealthMedicine Progress NoteAttending Provider: Haskell Riling, MDSubjective: Spoke with patient at bedside with the rest of medical teamWorking with PT this morningShe reports that she had some left thigh painWithout erythema or warmth Left ankle pain is similar from priorOtherwise feeling well Review of Meds: Reviewed in chartObjective: Vitals:Temp:  [97.9 ?F (36.6 ?C)-98.8 ?F (37.1 ?C)] 98.8 ?F (37.1 ?C)Pulse:  [69-93] 72Resp:  [17-20] 20BP: (83-117)/(60-84) 110/72SpO2:  [97 %-99 %] 97 %Device (Oxygen Therapy): room airI/O:Gross Totals (Last 24 hours) at 05/29/2022 1109Last data filed at 05/29/2022 0500Intake 1194 ml Output 1100 ml Net 94 ml Physical Exam:	GEN: pleasant and conversational laying in bed not in any acute distress HEENT: MMM, sclera non-icteric, EOMIPULM: breathing comfortable on room air, lungs sound clear on anterior lung fieldsCV: RRRABD: soft, PEG tube in place without discharge, ostomy in place with brown stoolEXT: no pitting edema ; endorses left foot tendernessMCP with mild subluxationMinimal warmth of L lateral ankleNEURO: moving all extremities without difficultyLabs:CBC unremarkableESR 72 (decreased from prior 91)CRP 12.1 (decreased from prior 33.5) Diagnostics:X-ray Ankle 6/8 No acute osseous abnormality (no acute fracture or dislocation)Assessment: 66 yo F with RA, diverticular perforation, 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 In Dec 2022, she was found to have abdominal abscess and had G tube placed for nutrition after Ethics was involved, and a STR facility was found in [place]. She was brought back to [facility] Feb 11 2022 due to ongoing encephalopathy and failure to thrive by sister, and the request was made for long term care by family. She has had extensive work up for encephalopathy, including MRI brain, L spine, paraneoplastic work up. No organic etiology identified. 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 Next set around 6/11/2023Diet: Diet tube feed with tray (Jevity 1.5); document tube feeds q4h# Dispo: pending long term placementSigned:Jerral Ralph, MDPsychiatry PGY-1Attending attestation to follow6/07/2022

## 2022-05-29 NOTE — Plan of Care
Inpatient Physical Therapy Progress Note IP Adult PT Eval/Treat - 05/29/22 0955    Date of Visit / Treatment  Date of Visit / Treatment 05/29/22   Note Type Progress Note   Progress Report Due 07/02/22   End Time 0955    General Information  Subjective My hamstring!   General Observations Pt received supine in bed; on RA; NAD; purwick; RN cleared   Precautions/Limitations Fall Precautions;Bed alarm    Vital Signs and Orthostatic Vital Signs  Vital Signs Vital Signs Stable   Vital Signs Free text RA; NAD    Pain/Comfort  Pain Comment (Pre/Post Treatment Pain) No pain at rest; c/o 7/10 L hamstring pain with mobilization; RN and team aware, repositioned for comfort    Patient Coping  Observed Emotional State accepting;cooperative   Verbalized Emotional State acceptance    Cognition  Overall Cognitive Status WFL   Orientation Level Oriented X4   Level of Consciousness alert   Following Commands Follows one step commands consistently   Personal Safety / Judgment Fall risk;Requires supervision with mobility   Cognition Comments Improved cognition; pleasant    Range of Motion  Range of Motion Examination WFL except   Range of Motion Comments bilateral knees lacking ~10-20 degrees of full extension, left ankle beginning plantar flexion contracture, lacking 20 degrees to neutral passively, BUE's WFL passively    Manual Muscle Testing  Manual Muscle Testing Comments + Deconditioned    Skin Assessment  Skin Assessment See Nursing Documentation    Posture, Head/Trunk Alignment  Posture, Head/Trunk Alignment Right lateral lean;Retropulsive    Balance  Sitting Balance: Static  POOR+    Minimal assist to maintain static position with no Assistive Device   Sitting Balance: Dynamic  POOR     Moves through 1/4 to 1/2 ROM range with moderate assist to right self   Balance Assist Device Hand held assist   Balance Skills Training Comment Ax2    Bed Mobility  Supine-to-Sit Independence/Assistance Level Maximum assist;Assist of 1   Supine-to-Sit Assist Device Hand held assist;Head of bed elevated   Sit-to-Supine Independence/Assistance Level Maximum assist;Assist of 1   Sit-to-Supine Assist Device Hand held assist;Head of bed elevated   Bed Mobility Comments R lateral lean requiring assist to prevent LOB; performed BUE/BLE therex and cross body reaches    Sit-Stand Transfer Training  Sit-Stand Transfer Comments Deferred d/t weakness and incr need for skilled assist    Handoff Documentation  Handoff Bed alarm;Patient in bed;Patient instructed to call nursing for mobility;Discussed with nursing   Handoff Comments Medical team bedside    Endurance  Endurance Comments Poor+    PT- AM-PAC - Basic Mobility Screen- How much help from another person do you currently need.....  Turning from your back to your side while in a a flat bed without using rails? 1 - Total - Requires total assistance or cannot do it at all.   Moving from lying on your back to sitting on the side of a flat bed without using bed rails? 1 - Total - Requires total assistance or cannot do it at all.   Moving to and from a bed to a chair (including a wheelchair)? 1 - Total - Requires total assistance or cannot do it at all.   Standing up from a chair using your arms(e.g., wheelchair or bedside chair)? 1 - Total - Requires total assistance or cannot do it at all.   To walk in a hospital room? 1 - Total - Requires total assistance or  cannot do it at all.   Climbing 3-5 steps with a railing? 1 - Total - Requires total assistance or cannot do it at all.   AMPAC Mobility Score 6   TARGET Highest Level of Mobility Mobility Level 2, Turn self in bed/bed activity/dependent transfer     Therapeutic Exercise  Therapeutic Exercise Comments BUE/BLE AROM    Neuromuscular Re-education  Neuromuscular Re-education Comments Static/dynamic seated PC training - B LAQ, cross body reaches    Therapeutic Functional Activity  Therapeutic Functional Activity Comments Bed mobility; Pt edu    Clinical Impression  Follow up Assessment Pt seen for PT f/u. Continues to require maxAx1 for bed mobility. R lateral lean seated EOB, required assist to prevent LOB. Performed BUE/BLE therex seated EOB along w/ crossbody reaches. Will benefit from skilled services to improve gross functional ROM/strength, postural control, and activity tolerance. Current PT d/c rec is SNF for LTC.   Criteria for Skilled Therapeutic Interventions Met yes    Patient/Family Stated Goals  Patient/Family Stated Goal(s) return to prior level of functioning;decrease pain;get stronger;feel better    Frequency/Equipment Recommendations  PT Frequency 2x per week   What day of week is next treatment expected? Monday   6/12  PT/PTA completing this assessment RL   Equipment Needs During Admission/Treatment No device    PT Recommendations for Inpatient Admission  Activity/Level of Assist dangle edge of bed;assist of 1   Positioning chair position of the bed;reposition frequently;elevate heels   Therapeutic Exercise ROM as tolerated   Other/Comments Pillow between knees in supine for contracture management and prevention of skin breakdown; encourage B knee extension in supine; encourage B podus boots or shorten bed to maintain 0 deg of ankle DF    Planned Treatment / Interventions  Plan for Next Visit Progress as tol   Training Treatment / Interventions Balance / Investment banker, operational;Endurance Training;Functional Personnel officer / Interventions Patient Education / Training    PT Discharge Summary  Physical Therapy Disposition Recommendation SNF for long term placement     Shanon Payor PT, DPTMHB: [phone]

## 2022-05-29 NOTE — Plan of Care
Plan of Care Overview/ Patient Status    1900 - 0700 A&Ox3. Disoriented to situation. VSS on RA. Denied pain, however pain obvious to BLE with repositioning, PRN Tramadol given and scheduled lidocaine patch applied to LLE. PEG patent, no residuals, TF given per orders, meds given orally. Ileostomy draining liquid stool & flatus. Purewick in place draining yellow urine. T&P'd as tolerated, pt often refuses. Appeared to rest comfortably overnight. Awaiting LTC. Hourly rounding performed and safety maintained. Please continue to follow POC. See flow sheets for complete assessment.  Problem: Adult Inpatient Plan of CareGoal: Plan of Care ReviewOutcome: Interventions implemented as appropriateFlowsheets (Taken 05/29/2022 0229)Progress: no changePlan of Care Reviewed With: patient

## 2022-05-30 MED ORDER — PREDNISONE 5 MG TABLET
5 mg | Freq: Every day | ORAL | Status: DC
Start: 2022-05-30 — End: 2022-07-25
  Administered 2022-05-31 – 2022-07-25 (×56): 5 mg via ORAL

## 2022-05-30 NOTE — Progress Notes
[facility] HealthMed[facility] Progress NoteAttending Provider: Haskell Riling, MDSubjective: Stopped tube feeds overnight given concern for loose PEG Secured PEG this morningOkay to resume tube feedsPatient feeling a bit more weak but otherwise feeling wellReview of Meds: Reviewed in chartObjective: Vitals:Temp:  [97.6 ?F (36.4 ?C)-98.1 ?F (36.7 ?C)] 97.6 ?F (36.4 ?C)Pulse:  [70-83] 70Resp:  [18] 18BP: (96-120)/(60-83) 120/83SpO2:  [98 %-100 %] 100 %Device (Oxygen Therapy): room airI/O:Gross Totals (Last 24 hours) at 05/30/2022 1116Last data filed at 05/30/2022 0227Intake 597 ml Output 750 ml Net -153 ml Physical Exam:	GEN: pleasant and conversational laying in bed not in any acute distress HEENT: MMM, sclera non-icteric, EOMIPULM: breathing comfortable on room air, lungs sound clear on anterior lung fieldsCV: RRRABD: soft, PEG tube in place without discharge, ostomy in place with brown stoolEXT: no pitting edema ; endorses left foot tenderness (present from prior)MCP with mild subluxationMinimal warmth of L lateral ankleNEURO: moving all extremities without difficultyLabs:No new labs Diagnostics:No new imagingAssessment: 66 yo F with RA, diverticular perforation, 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 In Dec 2022, she was found to have abdominal abscess and had G tube placed for nutrition after Ethics was involved, and a STR facility was found in [place]. She was brought back to [facility] Feb 11 2022 due to ongoing encephalopathy and failure to thrive by sister, and the request was made for long term care by family. She has had extensive work up for encephalopathy, including MRI brain, L spine, paraneoplastic work up. No organic etiology identified. 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 Next set around 6/11/2023Diet: Diet tube feed with tray (Jevity 1.5); document tube feeds q4h# Dispo: pending long term placementSigned:Jerral Ralph, MDPsychiatry PGY-1Attending attestation to follow6/08/2022

## 2022-05-30 NOTE — Plan of Care
Plan of Care Overview/ Patient Status    0700-1900Pt is A&Ox3-4, disorientated/confused by siltation. VSS on R.A. Takes pills whole. Regular diet. Bolus tf is held until GI can see the Gtube. Day team provider is aware of the findings from last night. During examination is does feel unsecured. Ostomy has good output, external female catheter in use. T&R q2hrs Ax2. Doesn't get OOB. No n/v. Pain increased when moving other wise patient is calm and pleasant. Safety maintained w/hourly rounding, bed in lowest position, and call bell within reach. 1050Provider Nedra Hai, said it was alright to use PEG and continue TF after securing tube. 1225Pt had severe pain, PRN tramadol given. Problem: Adult Inpatient Plan of CareGoal: Plan of Care ReviewOutcome: Interventions implemented as appropriateGoal: Patient-Specific Goal (Individualized)Outcome: Interventions implemented as appropriateGoal: Absence of Hospital-Acquired Illness or InjuryOutcome: Interventions implemented as appropriateGoal: Optimal Comfort and WellbeingOutcome: Interventions implemented as appropriate

## 2022-05-30 NOTE — Plan of Care
Plan of Care Overview/ Patient Status    1900-0700: A/o x3-4, disoriented to situation at times, VSS on room air. C/o right knee pain, prn tramadol given with positive effect, repositioned as needed. Denies SOB/chest pain. Incontinent urine, purewick in place. Colostomy in place, CDI. G tube noted to not be appropriately secured, appeared very loose, g tube movement noted even with respirations, provider made aware and at bedside, per provider do not use g tube or administer bolus feeds at this time, GI to evaluate. Skin intact. Pills whole. Assist x1-2 in bed. Hourly rounding completed, safety maintained, call bell within reach.

## 2022-05-31 NOTE — Plan of Care
Plan of Care Overview/ Patient Status    1900-0700: A/o x3, disoriented to situation at times, VSS on room air.  Denies SOB/chest pain. ostomy in place,changed this shift, purewick in place, incontinence care provided as needed. Peg tube patent,  9 PM jevity bolus feed given. heels offloaded, please see flowsheet for full skin assessment. Pills whole. Assist x 2 in bed. T & R Q2 hours. Hourly rounding completed, safety maintained. Call bell within reach

## 2022-05-31 NOTE — Progress Notes
[facility] HealthMedicine Progress NoteAttending Provider: Haskell Riling, MDSubjective: No acute overnight eventsDenies any new chief complaintsOngoing chronic pain in leg and feetReview of Meds: Reviewed in chartObjective: Vitals:Temp:  [97.4 ?F (36.3 ?C)-98 ?F (36.7 ?C)] 98 ?F (36.7 ?C)Pulse:  [69-83] 69Resp:  [17-18] 18BP: (100-107)/(63-67) 100/67SpO2:  [97 %] 97 %Device (Oxygen Therapy): room airI/O:Gross Totals (Last 24 hours) at 05/31/2022 0959Last data filed at 05/30/2022 2200Intake 237 ml Output 500 ml Net -263 ml Physical Exam:	GEN: pleasant and conversational laying in bed not in any acute distress HEENT: MMM, sclera non-icteric, EOMIPULM: breathing comfortable on room air, lungs sound clear on anterior lung fieldsCV: RRRABD: soft, PEG tube in place without discharge, ostomy in place with brown stoolEXT: no pitting edema ; endorses left foot tenderness (present from prior)MCP with mild subluxationMinimal warmth of L lateral ankleNEURO: moving all extremities without difficultyLabs:No new labs Diagnostics:No new imagingAssessment: 66 yo F with RA, diverticular perforation, 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 In Dec 2022, she was found to have abdominal abscess and had G tube placed for nutrition after Ethics was involved, and a STR facility was found in [place]. She was brought back to [facility] Feb 11 2022 due to ongoing encephalopathy and failure to thrive by sister, and the request was made for long term care by family. She has had extensive work up for encephalopathy, including MRI brain, L spine, paraneoplastic work up. No organic etiology identified. 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 Next set around 6/11/2023Diet: Diet tube feed with tray (Jevity 1.5); document tube feeds q4h# Dispo: pending long term placementSigned:Jerral Ralph, MDPsychiatry PGY-1Attending attestation to follow6/09/2022

## 2022-06-01 LAB — CBC WITH AUTO DIFFERENTIAL
BKR WAM ABSOLUTE IMMATURE GRANULOCYTES.: 0.02 x 1000/ÂµL (ref 0.00–0.30)
BKR WAM ABSOLUTE LYMPHOCYTE COUNT.: 1.56 x 1000/ÂµL (ref 0.60–3.70)
BKR WAM ABSOLUTE NRBC (2 DEC): 0 x 1000/ÂµL (ref 0.00–1.00)
BKR WAM ANALYZER ANC: 3.18 x 1000/ÂµL (ref 2.00–7.60)
BKR WAM BASOPHIL ABSOLUTE COUNT.: 0.02 x 1000/ÂµL (ref 0.00–1.00)
BKR WAM BASOPHILS: 0.3 % (ref 0.0–1.4)
BKR WAM EOSINOPHIL ABSOLUTE COUNT.: 0.35 x 1000/ÂµL (ref 0.00–1.00)
BKR WAM EOSINOPHILS: 6.1 % — ABNORMAL HIGH (ref 0.0–5.0)
BKR WAM HEMATOCRIT (2 DEC): 37.4 % (ref 35.00–45.00)
BKR WAM HEMOGLOBIN: 11.1 g/dL — ABNORMAL LOW (ref 11.7–15.5)
BKR WAM IMMATURE GRANULOCYTES: 0.3 % (ref 0.0–1.0)
BKR WAM LYMPHOCYTES: 27 % (ref 17.0–50.0)
BKR WAM MCH (PG): 26.5 pg — ABNORMAL LOW (ref 27.0–33.0)
BKR WAM MCHC: 29.7 g/dL — ABNORMAL LOW (ref 31.0–36.0)
BKR WAM MCV: 89.3 fL (ref 80.0–100.0)
BKR WAM MONOCYTE ABSOLUTE COUNT.: 0.64 x 1000/ÂµL (ref 0.00–1.00)
BKR WAM MONOCYTES: 11.1 % (ref 4.0–12.0)
BKR WAM MPV: 10.7 fL (ref 8.0–12.0)
BKR WAM NEUTROPHILS: 55.2 % (ref 39.0–72.0)
BKR WAM NUCLEATED RED BLOOD CELLS: 0 % (ref 0.0–1.0)
BKR WAM PLATELETS: 267 x1000/ÂµL (ref 150–420)
BKR WAM RDW-CV: 17.2 % — ABNORMAL HIGH (ref 11.0–15.0)
BKR WAM RED BLOOD CELL COUNT.: 4.19 M/ÂµL (ref 4.00–6.00)
BKR WAM WHITE BLOOD CELL COUNT: 5.8 x1000/ÂµL (ref 4.0–11.0)

## 2022-06-01 LAB — BASIC METABOLIC PANEL
BKR ANION GAP: 8 (ref 7–17)
BKR BLOOD UREA NITROGEN: 11 mg/dL (ref 8–23)
BKR BUN / CREAT RATIO: 35.5 — ABNORMAL HIGH (ref 8.0–23.0)
BKR CALCIUM: 10.4 mg/dL — ABNORMAL HIGH (ref 8.8–10.2)
BKR CHLORIDE: 106 mmol/L (ref 98–107)
BKR CO2: 25 mmol/L (ref 20–30)
BKR CREATININE: 0.31 mg/dL — ABNORMAL LOW (ref 0.40–1.30)
BKR EGFR, CREATININE (CKD-EPI 2021): >60 mL/min/{1.73_m2} (ref >=60)
BKR GLUCOSE: 97 mg/dL (ref 70–100)
BKR POTASSIUM: 4.5 mmol/L (ref 3.3–5.3)
BKR SODIUM: 139 mmol/L (ref 136–144)

## 2022-06-01 LAB — MAGNESIUM: BKR MAGNESIUM: 2 mg/dL (ref 1.7–2.4)

## 2022-06-01 NOTE — Progress Notes
[facility] HealthMed[facility] Progress NoteAttending Provider: Marletta Lor, MDSubjective: No acute overnight eventsDenies any new chief complaintsTube feeding going wellReview of Meds: Reviewed in chartObjective: Vitals:Temp:  [97.7 ?F (36.5 ?C)-97.8 ?F (36.6 ?C)] 97.8 ?F (36.6 ?C)Pulse:  [70-74] 70Resp:  [18] 18BP: (98-113)/(62-71) 113/71SpO2:  [99 %] 99 %Device (Oxygen Therapy): room airI/O:Gross Totals (Last 24 hours) at 06/01/2022 1033Last data filed at 06/01/2022 0542Intake 297 ml Output 1160 ml Net -863 ml Physical Exam:	GEN: pleasant and conversational laying in bed not in any acute distress HEENT: MMM, sclera non-icteric, EOMIPULM: breathing comfortable on room air, lungs sound clear on anterior lung fieldsCV: RRRABD: soft, PEG tube in place without discharge, ostomy in place with brown stoolEXT: no pitting edema ; endorses left foot tenderness (present from prior)MCP with mild subluxationMinimal warmth of L lateral ankleNEURO: moving all extremities without difficultyLabs:No new labs Diagnostics:No new imagingAssessment: 66 yo F with RA, diverticular perforation, 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 In Dec 2022, she was found to have abdominal abscess and had G tube placed for nutrition after Ethics was involved, and a STR facility was found in [place]. She was brought back to [facility] Feb 11 2022 due to ongoing encephalopathy and failure to thrive by sister, and the request was made for long term care by family. She has had extensive work up for encephalopathy, including MRI brain, L spine, paraneoplastic work up. No organic etiology identified. 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 Next set around 6/18/2023Diet: Diet tube feed with tray (Jevity 1.5); document tube feeds q4h# Dispo: pending long term placementSigned:Jerral Ralph, MDPsychiatry PGY-1Attending attestation to follow6/10/2022

## 2022-06-01 NOTE — Plan of Care
Plan of Care Overview/ Patient Status    1900 - 0700 A&Ox3. Disoriented to situation. VSS on RA. Denied pain, however pain obvious to BLE with repositioning. PEG patent, 30ml residual, TF given per orders, meds given orally. Ileostomy draining?liquid stool & flatus, appliance intact Purewick in place draining yellow urine. T&P'd as tolerated, pt often refuses. Appeared to rest comfortably overnight. Awaiting LTC. Hourly rounding performed and safety maintained. Please continue to follow POC. See flow sheets for complete assessment. 1610 Reported moderate-severe b/l heel pain. PRN 25mg  Tramadol given and repositioned.Problem: Adult Inpatient Plan of CareGoal: Plan of Care Review6/10/2022 0433 by Jones Skene, RNOutcome: Interventions implemented as appropriateFlowsheets (Taken 06/01/2022 0433)Progress: no changePlan of Care Reviewed With: patient6/10/2022 0433 by Jones Skene, RNOutcome: Interventions implemented as appropriate

## 2022-06-02 NOTE — Progress Notes
[name] HealthMedicine Progress NoteAttending Provider: Haskell Riling, MDSubjective: No acute overnight eventsWas using phone in bed todayOngoing chronic foot painReview of Meds: Reviewed in chartObjective: Vitals:Temp:  [97.9 ?F (36.6 ?C)-98.6 ?F (37 ?C)] 98.1 ?F (36.7 ?C)Pulse:  [72-79] 72Resp:  [18-20] 20BP: (110-114)/(69-72) 111/72SpO2:  [99 %-100 %] 99 %Device (Oxygen Therapy): room airPhysical Exam:	GEN: pleasant and conversational laying in bed not in any acute distress HEENT: MMM, sclera non-icteric, EOMIPULM: breathing comfortable on room air, lungs sound clear on anterior lung fieldsCV: RRRABD: soft, PEG tube in place without discharge, ostomy in place with brown stoolEXT: no pitting edema ; endorses left foot tenderness (present from prior)MCP with mild subluxationMinimal warmth of L lateral ankleNEURO: moving extremities without difficultyLabs:No new labs (weekly)Diagnostics:No new imagingAssessment: 66 yo F with RA, diverticular perforation, 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 In Dec 2022, she was found to have abdominal abscess and had G tube placed for nutrition after Ethics was involved, and a STR facility was found in [place]. She was brought back to [facility] Feb 11 2022 due to ongoing encephalopathy and failure to thrive by sister, and the request was made for long term care by family. She has had extensive work up for encephalopathy, including MRI brain, L spine, paraneoplastic work up. No organic etiology identified. 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 Next set around 6/18/2023Diet: Diet tube feed with tray (Jevity 1.5); document tube feeds q4h# Dispo: pending long term placementSigned:Jerral Ralph, MDPsychiatry PGY-1Attending attestation to follow6/11/2022

## 2022-06-02 NOTE — Plan of Care
Plan of Care Overview/ Patient Status    0700-1900Patient is A&Ox2-3, disoriented to time and situation. Complains of left leg pain, PRN tylenol administered per order. Takes pills whole. PEG in place, TF bolus as ordered. Ileostomy bag. Incontinent, purewick in place. AX2 turn and reposition Q2H, specialty bed in place. Safety checks and hourly rounding performed, see flow sheet and MAR for details. Problem: Adult Inpatient Plan of CareGoal: Plan of Care ReviewOutcome: Interventions implemented as appropriateGoal: Patient-Specific Goal (Individualized)Outcome: Interventions implemented as appropriateGoal: Absence of Hospital-Acquired Illness or InjuryOutcome: Interventions implemented as appropriateGoal: Optimal Comfort and WellbeingOutcome: Interventions implemented as appropriateGoal: Readiness for Transition of CareOutcome: Interventions implemented as appropriate Problem: InfectionGoal: Absence of Infection Signs and SymptomsOutcome: Interventions implemented as appropriate Problem: Physical Therapy GoalsGoal: Physical Therapy GoalsDescription: PT GOALS - addressed 05/02/22.1. Patient will demonstrate active range of motion x 4 extremities in preparation for mobility (GOAL MET)2. Caregivers will be independent and safely implement a range of motion/positioning program to prevent loss of range of motion and skin integrity (ONGOING)3. Patient will perform bed mobility with maximal assist (GOAL MET)4. Patient will tolerate sitting edge of bed >5 minutes with minimal assist (GOAL MET)5. Patient will perform transfers with moderate assist of 2 using rolling walker (ONGOING)6. Pt will perform bed mobility w/ modAx1 (ONGOING)7. Pt will perform STS transfer w/ modAx2 for a standing tolerance of 1 minute. (NEW) Outcome: Interventions implemented as appropriate Problem: Occupational Therapy GoalsGoal: Occupational Therapy GoalsDescription: OT goals1. Pt will move to EOB for seated ADL mod I2. Pt will follow 3/4 simple one step motor commands IND3. Pt will sequence through set up ADL task INDOT re-eval, Initial goals not met, continue to progress towards initial goalsOutcome: Interventions implemented as appropriate Problem: Speech Language Pathology GoalsGoal: Dysphagia Goals, SLPDescription: Current goal: Patient will tolerate regular solids and thin liquids without adverse impact on respiratory status and while maintaining adequate oral nutrition.Prior goal (met): The patient will tolerate least restrictive diet without any decline in respiratory status.Prior goal (met): Pt will tolerate pureed diet with thin liquids w/o s/x of aspiration or adverse impact on respiratory status.Outcome: Interventions implemented as appropriate Problem: Impaired Wound HealingGoal: Optimal Wound HealingOutcome: Interventions implemented as appropriate Problem: Fall Injury RiskGoal: Absence of Fall and Fall-Related InjuryOutcome: Interventions implemented as appropriate Problem: Skin Injury Risk IncreasedGoal: Skin Health and IntegrityOutcome: Interventions implemented as appropriate Problem: Mobility ImpairmentGoal: Optimal MobilityOutcome: Interventions implemented as appropriate Problem: Aspiration (Enteral Nutrition)Goal: Absence of Aspiration Signs and SymptomsOutcome: Interventions implemented as appropriate Problem: Device-Related Complication Risk (Enteral Nutrition)Goal: Safe, Effective Therapy DeliveryOutcome: Interventions implemented as appropriate Problem: Pain AcuteGoal: Acceptable Pain Control and Functional AbilityOutcome: Interventions implemented as appropriate Problem: Self-Care DeficitGoal: Improved Ability to Complete Activities of Daily LivingOutcome: Interventions implemented as appropriate Problem: Fluid, Electrolyte and Nutrition Imbalance (Ileostomy)Goal: Fluid, Electrolyte and Nutrition BalanceOutcome: Interventions implemented as appropriate Problem: Infection (Ileostomy)Goal: Absence of Infection Signs and SymptomsOutcome: Interventions implemented as appropriate Problem: Confusion ChronicGoal: Optimal Cognitive FunctionOutcome: Interventions implemented as appropriate

## 2022-06-02 NOTE — Plan of Care
Patient AAOx2, disoriented to time and situation. Denies pain and discomfort at rest, RLE painful with movement. PRN pain management administered.  PT evaluated patient today, to side of bed with exercises, did not stand. Patient expressed feeling a bit defeated, but calm and cooperative during care. Complete bed bath given.  Call bell within reach, bed alarm active. Problem: Adult Inpatient Plan of CareGoal: Plan of Care ReviewOutcome: Interventions implemented as appropriateFlowsheets (Taken 06/02/2022 1340)Progress: no changePlan of Care Reviewed With: patient Problem: Adult Inpatient Plan of CareGoal: Patient-Specific Goal (Individualized)Outcome: Interventions implemented as appropriateFlowsheets (Taken 06/01/2022 2200 by Loraine Maple, RN)What Anxieties, Fears, Concerns or Questions Do You Have About Your Care?: None StatedIndividualized Preferences and Care Needs: Per pocPatient Centered Long Term Goal: safe dcPatient Centered Daily Goal: sleep, call sister in the morning Plan of Care Overview/ Patient Status

## 2022-06-02 NOTE — Plan of Care
Inpatient Physical Therapy Progress Note IP Adult PT Eval/Treat - 06/02/22 1052    Date of Visit / Treatment  Date of Visit / Treatment 06/02/22   Note Type Progress Note   Progress Report Due 07/02/22   End Time 1052    General Information  Subjective This is becoming too much.   General Observations Pt received supine in bed; on RA; NAD; purwick; PEG; ileostomy; RN cleared   Precautions/Limitations Fall Precautions;Bed alarm    Weight Bearing Status  Weight Bearing Status Comments WBAT BLE    Vital Signs and Orthostatic Vital Signs  Vital Signs Vital Signs Stable   Vital Signs Free text RA; NAD    Pain/Comfort  Pain Comment (Pre/Post Treatment Pain) Pt c/o unquantified distal BLE pain (L>R) w/ mobilization; RN aware and repositioned for comfort    Patient Coping  Observed Emotional State accepting;hopeless;cooperative   Verbalized Emotional State acceptance;sadness    Cognition  Overall Cognitive Status WFL   Orientation Level Oriented to person;Oriented to place;Oriented to time   Level of Consciousness alert   Following Commands Follows one step commands consistently   Personal Safety / Judgment Fall risk;Requires supervision with mobility   Cognition Comments Noted sadness and beginning to make hopeless statements, reassurance and encouragement provided; RN aware    Range of Motion  Range of Motion Examination WFL except   Range of Motion Comments bilateral knees lacking ~20 degrees of full extension, B ankles beginning plantar flexion contractures, lacking 20 degrees to neutral passively, LUE WFL passively, RUE limited to ~ 100 deg PROM shld flexion    Manual Muscle Testing  Manual Muscle Testing Comments + Deconditioned    Skin Assessment  Skin Assessment See Nursing Documentation    Balance  Sitting Balance: Static  POOR      Moderate assist to maintain static position with no Assistive Device   Sitting Balance: Dynamic  POOR     Moves through 1/4 to 1/2 ROM range with moderate assist to right self   Balance Assist Device Hand held assist   Balance Skills Training Comment Ax1    Bed Mobility  Supine-to-Sit Independence/Assistance Level Maximum assist;Assist of 1   Supine-to-Sit Assist Device Hand held assist;Head of bed elevated;Bed rails   Sit-to-Supine Independence/Assistance Level Maximum assist;Assist of 1   Sit-to-Supine Assist Device Head of bed elevated;Hand held assist   Limitations decreased ability to use legs for bridging/pushing;decreased ability to use arms for pushing/pulling   Bed Mobility, Impairments ROM decreased;strength decreased;impaired balance;postural control impaired;pain   Bed Mobility Comments Assist to trunk and BLE; excessive R lateral lean requiring cues and assist to correct and prevent LOB; performed BLE/BUE therex & cross body reaches for truncal support    Sit-Stand Transfer Training  Sit-Stand Transfer Comments Deferred d/t weakness, fatigue, and incr need for assist    Handoff Documentation  Handoff Patient in bed;Bed alarm;Patient instructed to call nursing for mobility;Discussed with nursing    Endurance  Endurance Comments Poor +    PT- AM-PAC - Basic Mobility Screen- How much help from another person do you currently need.....  Turning from your back to your side while in a a flat bed without using rails? 1 - Total - Requires total assistance or cannot do it at all.   Moving from lying on your back to sitting on the side of a flat bed without using bed rails? 1 - Total - Requires total assistance or cannot do it at all.   Moving to and from  a bed to a chair (including a wheelchair)? 1 - Total - Requires total assistance or cannot do it at all.   Standing up from a chair using your arms(e.g., wheelchair or bedside chair)? 1 - Total - Requires total assistance or cannot do it at all.   To walk in a hospital room? 1 - Total - Requires total assistance or cannot do it at all.   Climbing 3-5 steps with a railing? 1 - Total - Requires total assistance or cannot do it at all.   AMPAC Mobility Score 6   TARGET Highest Level of Mobility Mobility Level 2, Turn self in bed/bed activity/dependent transfer    ACTUAL Highest Level of Mobility Mobility Level 3, Sit on edge of bed    Therapeutic Exercise  Therapeutic Exercise Comments BUE/BLE AROM; PROM BUE shld flex    Neuromuscular Re-education  Neuromuscular Re-education Comments Static/dynamic seated PC training    Therapeutic Functional Activity  Therapeutic Functional Activity Comments Bed mobility; Pt edu    Clinical Impression  Follow up Assessment Pt seen for PT f/u. Beginning to express sad and hopeless thoughts requiring reassurance and encouragement. Performs bed mobility with maxAx1. Noted excessive R lateral lean seated EOB requiring cues to correct. Performed BUE/BLE therex and crossbody reaches EOB, tolerated well. Will benefit from skilled services to improve gross functional ROM/strength, postural control, and activity tolerance. Current PT d/c rec is SNF for LTC.   Criteria for Skilled Therapeutic Interventions Met yes    Patient/Family Stated Goals  Patient/Family Stated Goal(s) return to prior level of functioning;decrease pain;feel better    Frequency/Equipment Recommendations  PT Frequency 2x per week   What day of week is next treatment expected? Friday   6/16  PT/PTA completing this assessment RL   Equipment Needs During Admission/Treatment No device    PT Recommendations for Inpatient Admission  Activity/Level of Assist dangle edge of bed;assist of 2   Positioning chair position of the bed;reposition frequently;elevate heels   Therapeutic Exercise ROM as tolerated   Other/Comments Pillow between knees in supine for contracture management and prevention of skin breakdown; encourage B knee extension in supine; encourage B podus boots or shorten bed or foam to maintain 0 deg of ankle DF    Planned Treatment / Interventions  Plan for Next Visit Progress as tol   Training Treatment / Interventions Balance / Investment banker, operational;Endurance Training;Functional Personnel officer / Interventions Patient Education / Training    PT Discharge Summary  Physical Therapy Disposition Recommendation SNF for long term placement     Shanon Payor PT, DPTMHB: [phone]

## 2022-06-02 NOTE — Plan of Care
Plan of Care Overview/ Patient Status    0700-1900Patient is A&Ox2-3, disoriented to time and situation. Complains of left leg pain, PRN tylenol administered per order. Takes pills whole. PEG in place, TF bolus as ordered. Ileostomy bag. Incontinent, purewick in place. AX2 turn and reposition Q2H, specialty bed in place. Safety checks and hourly rounding performed, see flow sheet and MAR for details.Problem: Adult Inpatient Plan of CareGoal: Plan of Care Review6/11/2022 0043 by Epimenio Foot, Riki Altes, RNOutcome: Interventions implemented as appropriate6/11/2022 0043 by Epimenio Foot, Riki Altes, RNOutcome: Interventions implemented as appropriateGoal: Patient-Specific Goal (Individualized)06/02/2022 0043 by Epimenio Foot, Riki Altes, RNOutcome: Interventions implemented as appropriate6/11/2022 0043 by Epimenio Foot, Riki Altes, RNOutcome: Interventions implemented as appropriateGoal: Absence of Hospital-Acquired Illness or Injury6/11/2022 0043 by Epimenio Foot, Riki Altes, RNOutcome: Interventions implemented as appropriate6/11/2022 0043 by Epimenio Foot, Becket Wecker, RNOutcome: Interventions implemented as appropriateGoal: Optimal Comfort and Wellbeing6/11/2022 0043 by Epimenio Foot, Riki Altes, RNOutcome: Interventions implemented as appropriate6/11/2022 0043 by Epimenio Foot, Riki Altes, RNOutcome: Interventions implemented as appropriateGoal: Readiness for Transition of Care6/11/2022 0043 by Epimenio Foot, Riki Altes, RNOutcome: Interventions implemented as appropriate6/11/2022 0043 by Epimenio Foot, Riki Altes, RNOutcome: Interventions implemented as appropriate Problem: InfectionGoal: Absence of Infection Signs and Symptoms6/11/2022 0043 by Epimenio Foot, Riki Altes, RNOutcome: Interventions implemented as appropriate6/11/2022 0043 by Epimenio Foot, Riki Altes, RNOutcome: Interventions implemented as appropriate Problem: Physical Therapy GoalsGoal: Physical Therapy GoalsDescription: PT GOALS - addressed 05/02/22.1. Patient will demonstrate active range of motion x 4 extremities in preparation for mobility (GOAL MET)2. Caregivers will be independent and safely implement a range of motion/positioning program to prevent loss of range of motion and skin integrity (ONGOING)3. Patient will perform bed mobility with maximal assist (GOAL MET)4. Patient will tolerate sitting edge of bed >5 minutes with minimal assist (GOAL MET)5. Patient will perform transfers with moderate assist of 2 using rolling walker (ONGOING)6. Pt will perform bed mobility w/ modAx1 (ONGOING)7. Pt will perform STS transfer w/ modAx2 for a standing tolerance of 1 minute. (NEW) 06/02/2022 0043 by Epimenio Foot, Kahley Leib, RNOutcome: Interventions implemented as appropriate6/11/2022 0043 by Epimenio Foot, Brayln Duque, RNOutcome: Interventions implemented as appropriate Problem: Occupational Therapy GoalsGoal: Occupational Therapy GoalsDescription: OT goals1. Pt will move to EOB for seated ADL mod I2. Pt will follow 3/4 simple one step motor commands IND3. Pt will sequence through set up ADL task INDOT re-eval, Initial goals not met, continue to progress towards initial goals6/11/2022 0043 by Epimenio Foot, Riki Altes, RNOutcome: Interventions implemented as appropriate6/11/2022 0043 by Epimenio Foot, Riki Altes, RNOutcome: Interventions implemented as appropriate Problem: Speech Language Pathology GoalsGoal: Dysphagia Goals, SLPDescription: Current goal: Patient will tolerate regular solids and thin liquids without adverse impact on respiratory status and while maintaining adequate oral nutrition.Prior goal (met): The patient will tolerate least restrictive diet without any decline in respiratory status.Prior goal (met): Pt will tolerate pureed diet with thin liquids w/o s/x of aspiration or adverse impact on respiratory status.06/02/2022 0043 by Epimenio Foot, Riki Altes, RNOutcome: Interventions implemented as appropriate6/11/2022 0043 by Epimenio Foot, Garmon Dehn, RNOutcome: Interventions implemented as appropriate Problem: Impaired Wound HealingGoal: Optimal Wound Healing6/11/2022 0043 by Epimenio Foot, Riki Altes, RNOutcome: Interventions implemented as appropriate6/11/2022 0043 by Epimenio Foot, Riki Altes, RNOutcome: Interventions implemented as appropriate Problem: Fall Injury RiskGoal: Absence of Fall and Fall-Related Injury6/11/2022 0043 by Epimenio Foot, Riki Altes, RNOutcome: Interventions implemented as appropriate6/11/2022 0043 by Epimenio Foot, Riki Altes, RNOutcome: Interventions implemented as appropriate Problem: Skin Injury Risk IncreasedGoal: Skin Health and Integrity6/11/2022 0043 by Epimenio Foot, Riki Altes, RNOutcome: Interventions implemented as appropriate6/11/2022 0043 by Epimenio Foot, Riki Altes, RNOutcome: Interventions implemented as appropriate Problem: Mobility ImpairmentGoal: Optimal Mobility6/11/2022 0043 by Judi Cong  Dola Argyle, RNOutcome: Interventions implemented as appropriate6/11/2022 0043 by Epimenio Foot, Riki Altes, RNOutcome: Interventions implemented as appropriate Problem: Aspiration (Enteral Nutrition)Goal: Absence of Aspiration Signs and Symptoms6/11/2022 0043 by Epimenio Foot, Mychal Durio, RNOutcome: Interventions implemented as appropriate6/11/2022 0043 by Epimenio Foot, Avyn Aden, RNOutcome: Interventions implemented as appropriate Problem: Device-Related Complication Risk (Enteral Nutrition)Goal: Safe, Effective Therapy Delivery6/11/2022 0043 by Epimenio Foot, Strother Everitt, RNOutcome: Interventions implemented as appropriate6/11/2022 0043 by Epimenio Foot, Amro Winebarger, RNOutcome: Interventions implemented as appropriate Problem: Pain AcuteGoal: Acceptable Pain Control and Functional Ability6/11/2022 0043 by Epimenio Foot, Riki Altes, RNOutcome: Interventions implemented as appropriate6/11/2022 0043 by Epimenio Foot, Riki Altes, RNOutcome: Interventions implemented as appropriate Problem: Self-Care DeficitGoal: Improved Ability to Complete Activities of Daily Living6/11/2022 0043 by Epimenio Foot, Riki Altes, RNOutcome: Interventions implemented as appropriate6/11/2022 0043 by Epimenio Foot, Riki Altes, RNOutcome: Interventions implemented as appropriate Problem: Fluid, Electrolyte and Nutrition Imbalance (Ileostomy)Goal: Fluid, Electrolyte and Nutrition Balance6/11/2022 0043 by Epimenio Foot, Riki Altes, RNOutcome: Interventions implemented as appropriate6/11/2022 0043 by Epimenio Foot, Riki Altes, RNOutcome: Interventions implemented as appropriate Problem: Infection (Ileostomy)Goal: Absence of Infection Signs and Symptoms6/11/2022 0043 by Epimenio Foot, Riki Altes, RNOutcome: Interventions implemented as appropriate6/11/2022 0043 by Epimenio Foot, Jaculin Rasmus, RNOutcome: Interventions implemented as appropriate Problem: Confusion ChronicGoal: Optimal Cognitive Function6/11/2022 0043 by Epimenio Foot, Riki Altes, RNOutcome: Interventions implemented as appropriate6/11/2022 0043 by Epimenio Foot, Annjeanette Sarwar, RNOutcome: Interventions implemented as appropriate

## 2022-06-03 NOTE — Plan of Care
Plan of Care Overview/ Patient Status    Problem: Adult Inpatient Plan of CareGoal: Plan of Care ReviewOutcome: Interventions implemented as appropriateGoal: Patient-Specific Goal (Individualized)Outcome: Interventions implemented as appropriateGoal: Absence of Hospital-Acquired Illness or InjuryOutcome: Interventions implemented as appropriateGoal: Optimal Comfort and WellbeingOutcome: Interventions implemented as appropriateGoal: Readiness for Transition of CareOutcome: Interventions implemented as appropriate Patient alert to self and place. Colostomy emptied at the beginning of shift. 150 out. Patient observed but not participating in colostomy care. She stated, Im sorry im still not used to it and it really grosses me out, it just smells so bad. Linens changed, external female cath changed, connected to wall suction. Tube feed given with supplement and tolerated 0ml residual. Slept well over night on and off.

## 2022-06-03 NOTE — Plan of Care
Danielle [name]					Location: EP [id number] y.o., female				Attending: Haskell Riling, MD	Admit Date: 02/11/2022			[id number] LOS: 112 days FOLLOW-UP NUTRITION ASSESSMENT Dietary Orders (From admission, onward)     Start     Ordered  04/24/22 1446  Diet Tube Feed With Tray  (Diet Tube Feed Panel)  DIET EFFECTIVE NOW      Comments: JEVITY 1.5 @ 1.5 carton bolus @9am  and 1 carton BID (5pm, 9pm) to total 3.5 cartons daily Question Answer Comment Diet Selection? Regular  Safety Tray? (Suicide/Homicide/Police Custody) Safety Tray - No  Adult Tube Feed Formulas: Jevity 1.5 ([facility],SRC)  Feeding Route: G-Tube  Initiate Nutrition Management Protocol (Yes/No?) Yes - Initiate Protocol    04/24/22 1446    No Known AllergiesANTHROPOMETRICS:Height:?70Admit wt:?79.4 kg (per SNF documentation.)Current wt:?73.5?kg BMI:?23?Dosing wt: ?74.8 kg (normal BMI)?Additional wt information:Ht confirmed with pt:?yes1-2+?edema noted per flowsheets. Wt increase of 2.1 kg?noted since last nutrition assessment?likely d/t fluid status.?Will ctm wts. Epic wt hx:? ? Wt: 05/21/22 74.8 kg 12/13/18 98.6 kg ??ESTIMATED NUTRITION REQUIREMENTS:Kcal/day:?1870????????????????????(25 ?kcal/kg)Protein/day:?75-90??????????????(1-1.2gm/kg)Fluids/day:?1870?mL/d?(62mL/kcal) ONLY as medical and fluid status allows and only per team discretion. Needs based on:?Dosing Wt (74.8?kg), maintenance??NUTRITION ASSESSMENT:?PT EMR reviewed for follow up assessment. Meds and labs reviewed. Since last nutrition assessment pt has continued to tolerate po intake while TF is at goal.?Per chart pt has been ordering an average of 1700 kcal with 48 g protein per day.?Met with pt at bedside.?Pt endorses a good appetite and reports she is having 3 meals @ with range average of ~75% completion with no n/v/abd pain. Pt had no nutrition related questions. Suspect based?on pt report of intake with the addition of TF pt is meeting nutrition needs.?Nutrition will continue to monitor for the remainder of admission.Pt recall is questionable and reported intake is not consistently reported therefore suspect pt is not eating as consistently as she reports and nursing is not documenting to support this report. Pt wt appears very stable at this time therefore suspect between tube feeds and po intake pt needs are supportive and being met  Will keep tube feed regimen as ordered at this time and reassess at next follow up.  ?Cultural/Religious/Ethnic Needs:?None?Food Allergies: NKFA?NUTRITION DIAGNOSIS:Severe malnutrition r/t depletion aeb moderate muscle and fat depletion.- continues?- if wt remains table and nutrition at supportive level would consider resolving malnutrition diagnosis. ?INTERVENTIONS/RECOMMENDATIONS:?Meals and Snacks:-Continue with Regular trays-Encourage protein rich food sources with each tray ordered to better meet protein needsGoal:?po intake?for comfort given support provided by?goal?volume bolus feeds,?-continues?Enteral Nutrition:?	Continue?JEVITY 1.5?@ 1.5?carton bolus @9am ?and?1 carton?BID (5pm,?9pm)?to total?3.5?cartons daily-	Provides:?821mL, 1244 kcal,?53gm protein,? free waterGoal: ?Patient will receive >90% of goal volume TF/ TPN prior to next nutrition assessment-suspect met, continue?Discharge Planning and Transfer of Nutrition Care:??Nutrition related discharge needs still being determined at this time, will continue to follow ??MONITORING / EVALUATION:?Food/Nutrition-Related History:Food and Nutrient IntakeEnteral and Parenteral Nutrition Intake?Anthropometric Measurements:Weight ?Biochemical Data, Medical Tests and Procedures:Electrolyte and renal profileGlucose/endocrine profile?Nutrition-Focus Physical FindingsOverall findings?Electronically signed by Sherrilee Gilles, RD, June 13, 2023[facility]- [phone] Please note: Nutrition is a consult only service on weekends and holidays.  Please enter a consult in EPIC if assistance is needed on a weekend or holiday or via [facility] [phone] to contact the covering RD.

## 2022-06-03 NOTE — Progress Notes
[facility] HealthMedicine Progress NoteAttending Provider: Haskell Riling, MDSubjective: No acute overnight eventsDescribing aching in her left leg and ankle Says she had a good session with PT yesterdayLeft foot still bothering her and she doesn't want to use the foam wedgeReview of Meds: Reviewed in chartObjective: Vitals:Temp:  [97.5 ?F (36.4 ?C)-98.7 ?F (37.1 ?C)] 98.4 ?F (36.9 ?C)Pulse:  [69-79] 69Resp:  [16-20] 16BP: (97-112)/(66-71) 97/66SpO2:  [97 %-100 %] 100 %Device (Oxygen Therapy): room airPhysical Exam:	GEN: pleasant and conversational laying in bed not in any acute distress HEENT: MMM, sclera non-icteric, EOMIPULM: breathing comfortable on room airABD: soft, PEG tube in place without discharge, ostomy in place with brown stoolEXT: no pitting edema ; contracted left foot/ankle with pain on range of motion of left ankle; no overt swelling or erythemaMCP with mild subluxationNEURO: moving extremities without difficulty, responding appropriately to questions although some answers possibly confabulation (says she went out to the garden recently)Labs:Weekly labs reviewedDiagnostics:No new imagingAssessment: 66 yo F with RA, diverticular perforation, 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 In Dec 2022, she was found to have abdominal abscess and had G tube placed for nutrition after Ethics was involved, and a STR facility was found in [place]. She was brought back to [facility] Feb 11 2022 due to ongoing encephalopathy and failure to thrive by sister, and the request was made for long term care by family. She has had extensive work up for encephalopathy, including MRI brain, L spine, paraneoplastic work up. No organic etiology identified. 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 Concern she will likely remain bed bound given prolonged course and the fact she is only getting PT 2x per week while inpatient awaiting placement. # Iron Deficiency- Continue ferrous gluconate 324 mg PO BID with breakfast and dinner# Dysphagia- Continue Banatrol Plus Oral Power packet TID# Chronic polyneuropathy- Continue gabapentin 100 mg TIDHome meds:- Continue niacin, B6, thiamine Code status: FullConserved Labs: getting weekly labs as she has been stable. Next set around 6/18/2023Diet: Diet tube feed with tray (Jevity 1.5); document tube feeds q4h# Dispo: pending long term placementSigned:Jameka Ivie, MDInternal Medicine PGY-31:25 PM 06/13/23Attending attestation to follow6/13/2023

## 2022-06-04 NOTE — Plan of Care
Plan of Care Overview/ Patient Status    Problem: Adult Inpatient Plan of CareGoal: Plan of Care ReviewOutcome: Interventions implemented as appropriateGoal: Patient-Specific Goal (Individualized)Outcome: Interventions implemented as appropriateGoal: Absence of Hospital-Acquired Illness or InjuryOutcome: Interventions implemented as appropriateGoal: Optimal Comfort and WellbeingOutcome: Interventions implemented as appropriateGoal: Readiness for Transition of CareOutcome: Interventions implemented as appropriate Patient alert to self, place. Vss, on room air. Colostomy changed, clean dry and intact. Stoma is pink/red, skin under appliance is clean and intact. Left side ostomy site has some scant drainage and under peg dressing was changed showing some redness and minimal drainage. TF tolerated with supplements.

## 2022-06-04 NOTE — Progress Notes
Danielle [name]: Danielle [name], MDSubjective: No acute overnight eventsChronic aches in her left leg and ankle Encouraged her to eat her lunch today Review of Meds: Reviewed in chartObjective: Vitals:Temp:  [97.4 ?F (36.3 ?C)-98.6 ?F (37 ?C)] 97.4 ?F (36.3 ?C)Pulse:  [67-89] 69Resp:  [18-20] 18BP: (100-109)/(62-71) 100/62SpO2:  [98 %] 98 %Device (Oxygen Therapy): room airPhysical Exam:	GEN: pleasant and conversational laying in bed not in any acute distress HEENT: MMM, sclera non-icteric, EOMIPULM: breathing comfortable on room airABD: soft, PEG tube in place without discharge, ostomy in place with brown stoolEXT: no pitting edema ; contracted left foot/ankle with pain on range of motion of left ankle; no overt swelling or erythemaMCP with mild subluxationNEURO: moving extremities without difficulty, responding appropriately to questions although some answers possibly confabulation Labs:Weekly labs reviewedDiagnostics:No new imagingAssessment: 66 yo F with RA, diverticular perforation, 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 In Dec 2022, she was found to have abdominal abscess and had G tube placed for nutrition after Ethics was involved, and a STR facility was found in [place]. She was brought back to [facility] Feb 11 2022 due to ongoing encephalopathy and failure to thrive by sister, and the request was made for long term care by family. She has had extensive work up for encephalopathy, including MRI brain, L spine, paraneoplastic work up. No organic etiology identified. 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 Concern she will likely remain bed bound given prolonged course and the fact she is only getting PT 2x per week while inpatient awaiting placement. # Iron Deficiency- Continue ferrous gluconate 324 mg PO BID with breakfast and dinner# Dysphagia- Continue Banatrol Plus Oral Power packet TID# Chronic polyneuropathy- Continue gabapentin 100 mg TIDHome meds:- Continue niacin, B6, thiamine Code status: FullConserved Labs: getting weekly labs as she has been stable. Next set around 6/18/2023Diet: Diet tube feed with tray (Jevity 1.5); document tube feeds q4h# Dispo: pending long term placementSigned:Jerral [name], MDPsychiatry PGY-1Attending attestation to follow6/14/2023

## 2022-06-05 NOTE — Plan of Care
Plan of Care Overview/ Patient Status    [name]&3 to self, vss, no complaint of pain. PO intake improving. . TF administered as ordered. Tolerating well. Incontinent of urine , care provided.?Illeostomy output 200 ml this shift.??Podus boots refused .  Heels off loaded on pillows. Oral care provided as ordered.  T&P q 2hrs as tolerated. On a specialty bed. Safety maintained, bed alarm on, call bell in reach

## 2022-06-05 NOTE — Plan of Care
Plan of Care Overview/ Patient Status    1900-0700: A/o x4, VSS on room air. C/o b/l foot pain, prn tramadol given with positive effect. Denies SOB/chest pain. ostomy in place, CDI changed this shift, purewick in place, incontinence care provided as needed. Peg tube patent, dressing changed this shift, 9 PM jevity bolus feed given. B/l lower extremity edema present, heels offloaded, please see flowsheet for full skin assessment. Pills whole. Assist x 2 in bed. T & R Q2 hours. Hourly rounding completed, safety maintained. Call bell within reach

## 2022-06-05 NOTE — Progress Notes
[facility] HealthMedicine Progress NoteAttending Provider: Haskell Riling, MDSubjective: No acute overnight eventsPatient on the phone this morning with the bank, feeling stressed about her financesDenies any problems with feeding tube, trying to intake her food traysNo chief physical complaints todayReview of Meds: Reviewed in chartObjective: Vitals:Temp:  [97.7 ?F (36.5 ?C)-98.7 ?F (37.1 ?C)] 97.7 ?F (36.5 ?C)Pulse:  [60-80] 80Resp:  [16-17] 16BP: (92-127)/(61-83) 127/83SpO2:  [96 %-98 %] 98 %Physical Exam:	GEN: pleasant and conversational laying in bed not in any acute distress HEENT: MMM, sclera non-icteric, EOMIPULM: breathing comfortable on room airABD: soft, PEG tube in place without discharge, ostomy in place with brown stoolEXT: no pitting edema ; contracted left foot/ankle with pain on range of motion of left ankle; no overt swelling or erythemaMCP with mild subluxationNEURO: moving extremities without difficulty, responding appropriately to questions although some answers possibly confabulation Labs:Weekly labs reviewedDiagnostics:No new imagingAssessment: 66 yo F with RA, diverticular perforation, 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 In Dec 2022, she was found to have abdominal abscess and had G tube placed for nutrition after Ethics was involved, and a STR facility was found in [place]. She was brought back to [facility] Feb 11 2022 due to ongoing encephalopathy and failure to thrive by sister, and the request was made for long term care by family. She has had extensive work up for encephalopathy, including MRI brain, L spine, paraneoplastic work up. No organic etiology identified. 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 Concern she will likely remain bed bound given prolonged course and the fact she is only getting PT 2x per week while inpatient awaiting placement. # Iron Deficiency- Continue ferrous gluconate 324 mg PO BID with breakfast and dinner# Dysphagia- Continue Banatrol Plus Oral Power packet TID# Chronic polyneuropathy- Continue gabapentin 100 mg TIDHome meds:- Continue niacin, B6, thiamine Code status: FullConserved Labs: getting weekly labs as she has been stable. Next set around 6/18/2023Diet: Diet tube feed with tray (Jevity 1.5); document tube feeds q4h# Dispo: pending long term placementSigned:Jerral Ralph, MDPsychiatry PGY-1Attending attestation to follow6/15/2023

## 2022-06-05 NOTE — Plan of Care
Plan of Care Overview/ Patient Status    Patient  A&O to self, vss, no complaint of pain. PO intake?improving.?. TF administered as ordered. Tolerating well. Incontinent of urine , care provided.?Illeostomy output 200 ml this shift.??Podus boots refused .  Heels off loaded on pillows. Oral care provided as ordered. ?T&P q 2hrs as tolerated. On a specialty bed. Safety maintained, bed alarm on, call bell in reach.

## 2022-06-05 NOTE — Plan of Care
Plan of Care Overview/ Patient Status    [name]&3 to self, vss, no complaint of pain. PO intake?improving.?. TF administered as ordered. Tolerating well. Incontinent of urine , care provided.?Illeostomy output this shift.??Podus boots refused .  Heels off loaded on pillows. Oral care provided as ordered. ?T&P q 2hrs as tolerated. On a specialty bed. Safety maintained, bed alarm on, call bell in

## 2022-06-06 NOTE — Plan of Care
Plan of Care Overview/ Patient Status    0700-1900Pt is A&OX3 disoritneted to time. VSS on R.A. Takes pills whole In pain, given tramadol & tylenol w/positive effects. Reg meal tray and bolus feeds per orders. T&R q2hrs Ax2. Doesn't get OOB. Physical therapy worked with patient. Illeostomy working, pure wick in place. No n/v. Safety maintained w/hourly rounding, bed in lowest position, call bell within reach.  Problem: Adult Inpatient Plan of CareGoal: Plan of Care Review6/16/2023 1307 by Celedonio Miyamoto, Paula Compton, RNOutcome: Interventions implemented as appropriate6/16/2023 1307 by Celedonio Miyamoto, Paula Compton, RNOutcome: Interventions implemented as appropriateGoal: Patient-Specific Goal (Individualized)06/06/2022 1307 by Celedonio Miyamoto, Paula Compton, RNOutcome: Interventions implemented as appropriate6/16/2023 1307 by Celedonio Miyamoto, Paula Compton, RNOutcome: Interventions implemented as appropriateGoal: Absence of Hospital-Acquired Illness or Injury6/16/2023 1307 by Celedonio Miyamoto, Paula Compton, RNOutcome: Interventions implemented as appropriate6/16/2023 1307 by Celedonio Miyamoto, Paula Compton, RNOutcome: Interventions implemented as appropriateGoal: Optimal Comfort and Wellbeing6/16/2023 1307 by Celedonio Miyamoto, Paula Compton, RNOutcome: Interventions implemented as appropriate6/16/2023 1307 by Celedonio Miyamoto, Paula Compton, RNOutcome: Interventions implemented as appropriate

## 2022-06-06 NOTE — Progress Notes
[facility] HealthMedicine Progress NoteAttending Provider: Haskell Riling, MDSubjective: No acute overnight eventsWithout any chief complaints today ; was sitting up in bed and very pleasant and conversationalVisitor at bedside this afternoonReview of Meds: Reviewed in chartObjective: Vitals:Temp:  [98 ?F (36.7 ?C)-99.1 ?F (37.3 ?C)] 98 ?F (36.7 ?C)Pulse:  [66-89] 89Resp:  [16-18] 18BP: (97-114)/(64-73) 107/71SpO2:  [93 %-97 %] 97 %Device (Oxygen Therapy): room airPhysical Exam:	GEN: pleasant and conversational laying in bed not in any acute distress HEENT: MMM, sclera non-icteric, EOMIPULM: breathing comfortable on room airABD: soft, PEG tube in place without discharge, ostomy in place with brown stoolEXT: no pitting edema ; contracted left foot/ankle with pain on range of motion of left ankle; no overt swelling or erythemaMCP with mild subluxationNEURO: moving extremities without difficulty, responding appropriately to questions although some answers possibly confabulation Labs:Weekly labs reviewedDiagnostics:No new imagingAssessment: 66 yo F with RA, diverticular perforation, 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 In Dec 2022, she was found to have abdominal abscess and had G tube placed for nutrition after Ethics was involved, and a STR facility was found in [place]. She was brought back to [facility] Feb 11 2022 due to ongoing encephalopathy and failure to thrive by sister, and the request was made for long term care by family. She has had extensive work up for encephalopathy, including MRI brain, L spine, paraneoplastic work up. No organic etiology identified. 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 Concern she will likely remain bed bound given prolonged course and the fact she is only getting PT 2x per week while inpatient awaiting placement. # Iron Deficiency- Continue ferrous gluconate 324 mg PO BID with breakfast and dinner# Dysphagia- Continue Banatrol Plus Oral Power packet TID# Chronic polyneuropathy- Continue gabapentin 100 mg TIDHome meds:- Continue niacin, B6, thiamine Code status: FullConserved Labs: getting weekly labs as she has been stable. Next set around 6/18/2023Diet: Diet tube feed with tray (Jevity 1.5); document tube feeds q4h# Dispo: pending long term placementSigned:Jerral Ralph, MDPsychiatry PGY-1Attending attestation to follow6/16/2023

## 2022-06-06 NOTE — Plan of Care
Plan of Care Overview/ Patient Status    1900-0700: A&Ox3, disoriented to time. VSS on RA. Complaint of BLE pain, elevated on pillows, refusing prn. Incontinent of urine, ileostomy in place, intact, draining stool, LBM 6/15. Dressing changed for right stoma. TF administered per order. No acute events this shift. Safety maintained. Call bell in reach. Bed alarm on. Specialty bed in place. Hourly rounding completed.Problem: Adult Inpatient Plan of CareGoal: Plan of Care ReviewOutcome: Interventions implemented as appropriateGoal: Patient-Specific Goal (Individualized)Outcome: Interventions implemented as appropriateGoal: Absence of Hospital-Acquired Illness or InjuryOutcome: Interventions implemented as appropriateGoal: Optimal Comfort and WellbeingOutcome: Interventions implemented as appropriateGoal: Readiness for Transition of CareOutcome: Interventions implemented as appropriate Problem: InfectionGoal: Absence of Infection Signs and SymptomsOutcome: Interventions implemented as appropriate Problem: Physical Therapy GoalsGoal: Physical Therapy GoalsDescription: PT GOALS - addressed 05/02/22.1. Patient will demonstrate active range of motion x 4 extremities in preparation for mobility (GOAL MET)2. Caregivers will be independent and safely implement a range of motion/positioning program to prevent loss of range of motion and skin integrity (ONGOING)3. Patient will perform bed mobility with maximal assist (GOAL MET)4. Patient will tolerate sitting edge of bed >5 minutes with minimal assist (GOAL MET)5. Patient will perform transfers with moderate assist of 2 using rolling walker (ONGOING)6. Pt will perform bed mobility w/ modAx1 (ONGOING)7. Pt will perform STS transfer w/ modAx2 for a standing tolerance of 1 minute. (NEW) Outcome: Interventions implemented as appropriate Problem: Occupational Therapy GoalsGoal: Occupational Therapy GoalsDescription: OT goals1. Pt will move to EOB for seated ADL mod I2. Pt will follow 3/4 simple one step motor commands IND3. Pt will sequence through set up ADL task INDOT re-eval, Initial goals not met, continue to progress towards initial goalsOutcome: Interventions implemented as appropriate Problem: Impaired Wound HealingGoal: Optimal Wound HealingOutcome: Interventions implemented as appropriate Problem: Fall Injury RiskGoal: Absence of Fall and Fall-Related InjuryOutcome: Interventions implemented as appropriate Problem: Skin Injury Risk IncreasedGoal: Skin Health and IntegrityOutcome: Interventions implemented as appropriate Problem: Mobility ImpairmentGoal: Optimal MobilityOutcome: Interventions implemented as appropriate Problem: Aspiration (Enteral Nutrition)Goal: Absence of Aspiration Signs and SymptomsOutcome: Interventions implemented as appropriate Problem: Device-Related Complication Risk (Enteral Nutrition)Goal: Safe, Effective Therapy DeliveryOutcome: Interventions implemented as appropriate Problem: Pain AcuteGoal: Acceptable Pain Control and Functional AbilityOutcome: Interventions implemented as appropriate Problem: Speech Language Pathology GoalsGoal: Dysphagia Goals, SLPDescription: Current goal: Patient will tolerate regular solids and thin liquids without adverse impact on respiratory status and while maintaining adequate oral nutrition.Prior goal (met): The patient will tolerate least restrictive diet without any decline in respiratory status.Prior goal (met): Pt will tolerate pureed diet with thin liquids w/o s/x of aspiration or adverse impact on respiratory status.Outcome: Interventions implemented as appropriate Problem: Self-Care DeficitGoal: Improved Ability to Complete Activities of Daily LivingOutcome: Interventions implemented as appropriate Problem: Fluid, Electrolyte and Nutrition Imbalance (Ileostomy)Goal: Fluid, Electrolyte and Nutrition BalanceOutcome: Interventions implemented as appropriate Problem: Infection (Ileostomy)Goal: Absence of Infection Signs and SymptomsOutcome: Interventions implemented as appropriate Problem: Confusion ChronicGoal: Optimal Cognitive FunctionOutcome: Interventions implemented as appropriate

## 2022-06-06 NOTE — Plan of Care
Plan of Care Overview/ Patient Status    Per DPR, patient remains medically cleared for discharge. Plan is SNF LTC, pending T-19 approval. T-19 application was completed in March 2023 and could not have been completed sooner due to patient's sister [name] having wait to be appointed Conservatorship, which was granted on May 12th. Per [name] she still has to provide additional documents and noted she has an appointment with [facility] on June 23rd. CM will continue to follow and assist. Melina Copa, MSW, LCSWCare [phone]

## 2022-06-06 NOTE — Plan of Care
Inpatient Physical Therapy Progress Note IP Adult PT Eval/Treat - 06/06/22 1015    Date of Visit / Treatment  Date of Visit / Treatment 06/06/22   Note Type Progress Note   Progress Report Due 07/02/22   End Time 1015    General Information  Subjective This is my big brother.   General Observations Pt received supine in bed; on RA; NAD; PEG; ileostomy; brother bedside; RN cleared   Precautions/Limitations Fall Precautions;Bed alarm    Weight Bearing Status  Weight Bearing Status Comments WBAT BLE    Vital Signs and Orthostatic Vital Signs  Vital Signs Vital Signs Stable   Vital Signs Free text RA; NAD    Pain/Comfort  Pain Comment (Pre/Post Treatment Pain) Pt c/o 7/10 L ankle/foot pain when mobilized; RN aware and repositioned for comfort.    Patient Coping  Observed Emotional State accepting;cooperative   Verbalized Emotional State acceptance    Cognition  Overall Cognitive Status WFL   Orientation Level Oriented X4   Level of Consciousness alert   Following Commands Follows one step commands consistently   Personal Safety / Judgment Fall risk;Requires supervision with mobility   Cognition Comments Pleasant; self-limiting at times    Range of Motion  Range of Motion Examination [facility] except   Range of Motion Comments bilateral knees lacking ~20 degrees of full extension, B ankles beginning plantar flexion contractures, lacking 20 degrees to neutral passively, LUE WFL passively, RUE limited to ~ 100 deg PROM shld flexion    Manual Muscle Testing  Manual Muscle Testing Comments + Deconditioned    Skin Assessment  Skin Assessment See Nursing Documentation    Posture, Head/Trunk Alignment  Posture, Head/Trunk Alignment Right lateral lean    Balance  Sitting Balance: Static  POOR      Moderate assist to maintain static position with no Assistive Device   Sitting Balance: Dynamic  POOR     Moves through 1/4 to 1/2 ROM range with moderate assist to right self   Standing Balance: Static POOR-     Maximal assist to maintain static position with no Assistive Device   Standing Balance: Dynamic  POOR-    Unable to move from midline voluntarily, maximal assist to right self   Balance Assist Device Hand held assist   Balance Skills Training Comment Ax1-2    Bed Mobility  Supine-to-Sit Independence/Assistance Level Maximum assist;Assist of 1   Supine-to-Sit Assist Device Head of bed elevated;Hand held assist;Draw pad;Bed rails   Limitations decreased ability to use legs for bridging/pushing   Bed Mobility, Impairments pain;ROM decreased;strength decreased;postural control impaired;sensory feedback impaired   Bed mobility was limited by: impaired motor function;pain   Bed Mobility Comments Assist to trunk and BLE; R lateral lean requiring cues to correct; performed BLE therex, crossbody reaches, and RUE PROM steadily EOB    Sit-Stand Transfer Training  Sit-Stand Transfer Comments Deferred d/t weakness, fatigue, and incr need for assist    Handoff Documentation  Handoff --   Pt seated EOB with elevated HOB and bed alarm on; brother bedside; Pt, brother, and PCA educated to call for assist for seated EOB >supine if left alone   Endurance  Endurance Comments Poor+    PT- AM-PAC - Basic Mobility Screen- How much help from another person do you currently need.....  Turning from your back to your side while in a a flat bed without using rails? 1 - Total - Requires total assistance or cannot do it at all.   Moving from  lying on your back to sitting on the side of a flat bed without using bed rails? 1 - Total - Requires total assistance or cannot do it at all.   Moving to and from a bed to a chair (including a wheelchair)? 1 - Total - Requires total assistance or cannot do it at all.   Standing up from a chair using your arms(e.g., wheelchair or bedside chair)? 1 - Total - Requires total assistance or cannot do it at all.   To walk in a hospital room? 1 - Total - Requires total assistance or cannot do it at all.   Climbing 3-5 steps with a railing? 1 - Total - Requires total assistance or cannot do it at all.   AMPAC Mobility Score 6   TARGET Highest Level of Mobility Mobility Level 2, Turn self in bed/bed activity/dependent transfer    ACTUAL Highest Level of Mobility Mobility Level 3, Sit on edge of bed    Therapeutic Exercise  Therapeutic Exercise Comments BUE/BLE AROM; PROM BUE shld flex to ~ 100 deg    Neuromuscular Re-education  Neuromuscular Re-education Comments Static/dynamic seated PC training    Therapeutic Functional Activity  Therapeutic Functional Activity Comments Bed mobility; Pt and family edu    Clinical Impression  Follow up Assessment Pt seen for PT f/u. Continued to require maxAx1 for bed mobility w. severe R lateral lean noted requiring assist to prevent LOB. Tolerated BLE therex, crossbody reaches, and PROM to RUE well seated EOB. Pt and family edu provided on HEP, brother acknowledges and participated in ROM to BLE. Will benefit from skilled services to improve gross functional ROM/strength, static postural control, and activity tolerance. Current PT d/c rec is SNF for LTC.   Criteria for Skilled Therapeutic Interventions Met yes    Patient/Family Stated Goals  Patient/Family Stated Goal(s) return to prior level of functioning;decrease pain;get stronger;feel better    Frequency/Equipment Recommendations  PT Frequency 2x per week   What day of week is next treatment expected? Monday   6/19  PT/PTA completing this assessment RL   Equipment Needs During Admission/Treatment No device    PT Recommendations for Inpatient Admission  Activity/Level of Assist dangle edge of bed;assist of 2   Positioning chair position of the bed;reposition frequently   Therapeutic Exercise ROM as tolerated   Other/Comments * Pillow between knees in supine for contracture management and prevention of skin breakdown; encourage B knee extension in supine; encourage B podus boots or shorten bed or foam to maintain 0 deg of ankle DF    Planned Treatment / Interventions  Plan for Next Visit Progress as tol   Training Treatment / Interventions Balance / Investment banker, operational;Endurance Training;Functional Personnel officer / Interventions Patient Education / Training    PT Discharge Summary  Physical Therapy Disposition Recommendation SNF for long term placement     Shanon Payor PT, DPTMHB: [phone]

## 2022-06-07 NOTE — Progress Notes
[facility] HealthMedicine Progress NoteAttending Provider: Haskell Riling, MDSubjective: No acute overnight eventsWatching TV this morningReview of Meds: Reviewed in chartObjective: Vitals:Temp:  [97.9 ?F (36.6 ?C)-98 ?F (36.7 ?C)] 97.9 ?F (36.6 ?C)Pulse:  [82-95] 82Resp:  [18-20] 20BP: (96-119)/(63-77) 112/77SpO2:  [96 %-98 %] 96 %Device (Oxygen Therapy): room airPhysical Exam:	GEN: pleasant and conversational laying in bed not in any acute distress HEENT: MMM, sclera non-icteric, EOMIPULM: breathing comfortable on room airABD: soft, PEG tube in place without discharge, ostomy in place with brown stoolEXT: no pitting edema ; contracted left foot/ankle with pain on range of motion of left ankle; no overt swelling or erythemaMCP with mild subluxationNEURO: moving extremities without difficulty, responding appropriately to questions although some answers possibly confabulation Labs:Weekly labs reviewedDiagnostics:No new imagingAssessment: 66 yo F with RA, diverticular perforation, 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 In Dec 2022, she was found to have abdominal abscess and had G tube placed for nutrition after Ethics was involved, and a STR facility was found in [place]. She was brought back to [facility] Feb 11 2022 due to ongoing encephalopathy and failure to thrive by sister, and the request was made for long term care by family. She has had extensive work up for encephalopathy, including MRI brain, L spine, paraneoplastic work up. No organic etiology identified. 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 Concern she will likely remain bed bound given prolonged course and the fact she is only getting PT 2x per week while inpatient awaiting placement. # Iron Deficiency- Continue ferrous gluconate 324 mg PO BID with breakfast and dinner# Dysphagia- Continue Banatrol Plus Oral Power packet TID# Chronic polyneuropathy- Continue gabapentin 100 mg TIDHome meds:- Continue niacin, B6, thiamine Code status: FullConserved Labs: getting weekly labs as she has been stable. Next set around 6/18/2023Diet: Diet tube feed with tray (Jevity 1.5); document tube feeds q4h# Dispo: pending long term placementSigned:Yonatan Guitron, MDInternal Medicine PGY-310:07 AM 06/17/23Attending attestation to follow6/17/2023

## 2022-06-07 NOTE — Plan of Care
Plan of Care Overview/ Patient Status    1900 - 0700 A&Ox3. Disoriented to situation. VSS on RA. Initially denied pain, but positioned and reported severe pain to BLE, scheduled gabapentin and PRN 25mg  tramadol continued. PEG patent, 20ml residual. Ostomy intact draining liquid stool. Appeared to rest comfortably between care. Awaiting LTP. Hourly rounding performed and safety maintained. Please continue to follow POC. See flow sheets for complete assessment. Problem: Adult Inpatient Plan of CareGoal: Plan of Care ReviewOutcome: Interventions implemented as appropriateFlowsheets (Taken 06/07/2022 0120)Progress: no changePlan of Care Reviewed With: patient

## 2022-06-07 NOTE — Plan of Care
Plan of Care Overview/ Patient Status    0700-1900Pt is A&Ox3 dis to sit, reorientation provided, VSS on RA, denies pain, SOB, N/V,??ileostomy?x2 left covered with dressing, right brown liquid stool, rosebud pink, QT&Rq2hr, oral care, independently performing. TF via peg as ordered, tolerated well, pills po with water tolerated well. +UO, refused Bilateral LE in podus boots, safety maintained.

## 2022-06-08 LAB — CBC WITH AUTO DIFFERENTIAL
BKR WAM ABSOLUTE IMMATURE GRANULOCYTES.: 0.01 x 1000/ÂµL (ref 0.00–0.30)
BKR WAM ABSOLUTE LYMPHOCYTE COUNT.: 1.89 x 1000/ÂµL (ref 0.60–3.70)
BKR WAM ABSOLUTE NRBC (2 DEC): 0 x 1000/ÂµL (ref 0.00–1.00)
BKR WAM ANALYZER ANC: 3.38 x 1000/ÂµL (ref 2.00–7.60)
BKR WAM BASOPHIL ABSOLUTE COUNT.: 0.04 x 1000/ÂµL (ref 0.00–1.00)
BKR WAM BASOPHILS: 0.6 % (ref 0.0–1.4)
BKR WAM EOSINOPHIL ABSOLUTE COUNT.: 0.55 x 1000/ÂµL (ref 0.00–1.00)
BKR WAM EOSINOPHILS: 8.4 % — ABNORMAL HIGH (ref 0.0–5.0)
BKR WAM HEMATOCRIT (2 DEC): 38.3 % (ref 35.00–45.00)
BKR WAM HEMOGLOBIN: 12.1 g/dL (ref 11.7–15.5)
BKR WAM IMMATURE GRANULOCYTES: 0.2 % (ref 0.0–1.0)
BKR WAM LYMPHOCYTES: 28.8 % (ref 17.0–50.0)
BKR WAM MCH (PG): 27.6 pg (ref 27.0–33.0)
BKR WAM MCHC: 31.6 g/dL (ref 31.0–36.0)
BKR WAM MCV: 87.4 fL (ref 80.0–100.0)
BKR WAM MONOCYTE ABSOLUTE COUNT.: 0.69 x 1000/ÂµL (ref 0.00–1.00)
BKR WAM MONOCYTES: 10.5 % (ref 4.0–12.0)
BKR WAM MPV: 10.9 fL (ref 8.0–12.0)
BKR WAM NEUTROPHILS: 51.5 % (ref 39.0–72.0)
BKR WAM NUCLEATED RED BLOOD CELLS: 0 % (ref 0.0–1.0)
BKR WAM PLATELETS: 324 x1000/ÂµL (ref 150–420)
BKR WAM RDW-CV: 16.7 % — ABNORMAL HIGH (ref 11.0–15.0)
BKR WAM RED BLOOD CELL COUNT.: 4.38 M/ÂµL (ref 4.00–6.00)
BKR WAM WHITE BLOOD CELL COUNT: 6.6 x1000/ÂµL (ref 4.0–11.0)

## 2022-06-08 LAB — BASIC METABOLIC PANEL
BKR ANION GAP: 11 (ref 7–17)
BKR BLOOD UREA NITROGEN: 13 mg/dL (ref 8–23)
BKR BUN / CREAT RATIO: 37.1 — ABNORMAL HIGH (ref 8.0–23.0)
BKR CALCIUM: 10.9 mg/dL — ABNORMAL HIGH (ref 8.8–10.2)
BKR CHLORIDE: 105 mmol/L (ref 98–107)
BKR CO2: 23 mmol/L (ref 20–30)
BKR CREATININE: 0.35 mg/dL — ABNORMAL LOW (ref 0.40–1.30)
BKR EGFR, CREATININE (CKD-EPI 2021): >60 mL/min/{1.73_m2} (ref >=60)
BKR GLUCOSE: 95 mg/dL (ref 70–100)
BKR POTASSIUM: 4.2 mmol/L (ref 3.3–5.3)
BKR SODIUM: 139 mmol/L (ref 136–144)

## 2022-06-08 LAB — MAGNESIUM: BKR MAGNESIUM: 2 mg/dL (ref 1.7–2.4)

## 2022-06-08 NOTE — Plan of Care
Plan of Care Overview/ Patient Status    1900 - 0700 A&Ox3. Disoriented to situation. VSS on RA. Denied pain, but pain obvious w/repositioning, scheduled gabapentin continued. PEG patent, no residual. Continues w/serosang/creamy tan drainage around PEG insertion site. Ostomy intact draining liquid stool. Appeared to rest comfortably between care. Awaiting LTP. Hourly rounding performed and safety maintained. Please continue to follow POC. See flow sheets for complete assessment.  Problem: Adult Inpatient Plan of CareGoal: Plan of Care Review6/18/2023 0603 by Jones Skene, RNOutcome: Interventions implemented as appropriate6/18/2023 0602 by Jones Skene, RNFlowsheets (Taken 06/08/2022 0602)Progress: no changePlan of Care Reviewed With: patient6/18/2023 0602 by Jones Skene, RNOutcome: Interventions implemented as appropriate

## 2022-06-08 NOTE — Plan of Care
Plan of Care Overview/ Patient Status    0700-1900Pt is A&Ox3 dis to sit, reorientation provided, VSS on RA, denies pain, SOB, N/V,??ileostomy?x2 left covered with dressing, right brown liquid stool, rosebud pink, QT&Rq2hr, oral care, independently performing. TF via peg as ordered, tolerated well, pills po with water tolerated well. +UO, refused Bilateral LE in podus boots, safety maintained.

## 2022-06-08 NOTE — Progress Notes
[facility] HealthMedicine Progress NoteAttending Provider: Haskell Riling, MDSubjective: No acute overnight eventsPatient tearful this morning and frustrated about being in the hospitalStill with ongoing chronic pain in legsReview of Meds: Reviewed in chartObjective: Vitals:Temp:  [97.3 ?F (36.3 ?C)-98.2 ?F (36.8 ?C)] 97.3 ?F (36.3 ?C)Pulse:  [61-78] 78Resp:  [16-18] 16BP: (103-110)/(70-75) 103/70SpO2:  [98 %-99 %] 98 %Device (Oxygen Therapy): room airPhysical Exam:	GEN: tearful this AM, not in acute distressHEENT: MMM, sclera non-icteric, EOMIPULM: breathing comfortable on room airABD: soft, PEG tube in place without discharge, ostomy in place with brown stoolEXT: no pitting edema ; contracted left foot/ankle with pain on range of motion of left ankle; no overt swelling or erythemaMCP with mild subluxationNEURO: moving extremities without difficulty, responding appropriately to questions although some answers possibly confabulation Labs:Weekly labs reviewedDiagnostics:No new imagingAssessment: 66 yo F with RA, diverticular perforation, 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 In Dec 2022, she was found to have abdominal abscess and had G tube placed for nutrition after Ethics was involved, and a STR facility was found in [place]. She was brought back to [facility] Feb 11 2022 due to ongoing encephalopathy and failure to thrive by sister, and the request was made for long term care by family. She has had extensive work up for encephalopathy, including MRI brain, L spine, paraneoplastic work up. No organic etiology identified. 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 Concern she will likely remain bed bound given prolonged course and the fact she is only getting PT 2x per week while inpatient awaiting placement. # Iron Deficiency- Continue ferrous gluconate 324 mg PO BID with breakfast and dinner# Dysphagia- Continue Banatrol Plus Oral Power packet TID# Chronic polyneuropathy- Continue gabapentin 100 mg TIDHome meds:- Continue niacin, B6, thiamine Code status: FullConserved Labs: getting weekly labs as she has been stable. Next set around 6/25/2023Diet: Diet tube feed with tray (Jevity 1.5); document tube feeds q4h# Dispo: pending long term placementSigned:Jerral Ralph, MDPsychiatry PGY-1Attending attestation to follow6/18/2023

## 2022-06-09 NOTE — Plan of Care
Plan of Care Overview/ Patient Status    Medical Social Work Follow Up  [employer] Most Recent Value Admission Information  Document Type Progress Note Reason for Inability to Assess --  [Patient mental status is altered] (For Inpatient/ED Only) Prior psychosocial assessment has been documented within this hospitalization No (For Inpatient/ED Only) Prior psychosocial assessment has been documented within 30 days of this hospitalization No Reason for Current Social Work Scientist, water quality, Environmental health practitioner Medical Team Comment Transition Coordination Community Agency Comment [place] [address] [facility] Record Reviewed Yes Level of Care Inpatient Psychosocial issues requiring intervention Discharge planning, Title 19 Psychosocial interventions 10 minutes spent on the phone with [employer]. [name] reported that she is just waiting for the last bank statements to arrive as she requested them to be mailed. [name] spoke about the possibility of [place] going to a short term rehab and then transition over to long term care as she stated that she had been in contact with a facility in [place]. I informed her that she would have to follow-up with Care Management/Transition Coordination and she agreed. I provided support to [facility] as she shared the difficulties and frustrations regarding the process and the relationship between her and Brandilyn and the other siblings. I then informed Transition Coordination of [name]'s request about the rehab. Collaborations [employer], Transition Coordination Specific referrals to enhance community supports (include existing and new resources) None Handoff Required? No Next Steps/Plan (including hand-off): I will continue to remain involved for support as needed. Signature: Leim Fabry, LCSW Contact Information: [phone]

## 2022-06-09 NOTE — Plan of Care
Inpatient Physical Therapy Progress Note IP Adult PT Eval/Treat - 06/09/22 1126    Date of Visit / Treatment  Date of Visit / Treatment 06/09/22   Note Type Progress Note   Progress Report Due 07/02/22   End Time 1126    General Information  Subjective Okay, take care.   General Observations Pt received supine in bed;on RA; NAD; ostomy; PEG; purwick; RN cleared   Precautions/Limitations Fall Precautions;Bed alarm    Weight Bearing Status  Weight Bearing Status Comments WBAT BLE    Vital Signs and Orthostatic Vital Signs  Vital Signs Vital Signs Stable   Vital Signs Free text RA; NAD    Pain/Comfort  Pain Comment (Pre/Post Treatment Pain) Pt c/o unquantified L posterior knee pain with mobilization; Pt tearful d/t pain; RN aware and repositioned for comfort    Patient Coping  Observed Emotional State accepting;anxious;cooperative;tearful/crying   Verbalized Emotional State acceptance;hopelessness    Cognition  Overall Cognitive Status WFL   Orientation Level Oriented X4   Level of Consciousness alert   Following Commands Follows one step commands consistently   Personal Safety / Judgment Fall risk;Requires supervision with mobility   Cognition Comments Pleasant; self-limiting at times requiring motivation and encouragement    Range of Motion  Range of Motion Examination WFL except   Range of Motion Comments bilateral knees lacking ~20 degrees of full extension, B ankles beginning plantar flexion contractures, lacking 20 degrees to neutral passively, LUE WFL passively, RUE limited to ~ 100 deg PROM shld flexion    Manual Muscle Testing  Manual Muscle Testing Comments + Deconditioned    Skin Assessment  Skin Assessment See Nursing Documentation    Posture, Head/Trunk Alignment  Posture, Head/Trunk Alignment Right lateral lean    Balance  Sitting Balance: Static  POOR      Moderate assist to maintain static position with no Assistive Device   Sitting Balance: Dynamic  POOR     Moves through 1/4 to 1/2 ROM range with moderate assist to right self   Standing Balance: Static POOR-     Maximal assist to maintain static position with no Assistive Device   Standing Balance: Dynamic  POOR-    Unable to move from midline voluntarily, maximal assist to right self   Balance Assist Device Hand held assist   Balance Skills Training Comment Ax1-2    Bed Mobility  Supine-to-Sit Independence/Assistance Level Maximum assist;Assist of 1   Supine-to-Sit Assist Device Bed rails;Draw pad;Hand held assist;Head of bed elevated   Sit-to-Supine Independence/Assistance Level Maximum assist;Assist of 1   Sit-to-Supine Assist Device Head of bed elevated;Hand held assist   Limitations decreased ability to use legs for bridging/pushing   Bed Mobility, Impairments ROM decreased;strength decreased;impaired balance;sensory feedback impaired;pain   Bed mobility was limited by: impaired motor function;pain   Bed Mobility Comments Assist to BLE and trunk; severe R lateral lean seated EOB, requiring cues to correct; Performed BLE therex, RUE PROM, and crossbody BUE reaches EOB    Museum/gallery curator Comments Deferred d/t BLE weakness and incr need for assist    Handoff Documentation  Handoff Patient in bed;Bed alarm;Patient instructed to call nursing for mobility;Discussed with nursing   Handoff Comments Foam wedge to foot of bed    Endurance  Endurance Comments Poor+    PT- AM-PAC - Basic Mobility Screen- How much help from another person do you currently need.....  Turning from your back to your side while in a a flat bed without  using rails? 1 - Total - Requires total assistance or cannot do it at all.   Moving from lying on your back to sitting on the side of a flat bed without using bed rails? 1 - Total - Requires total assistance or cannot do it at all.   Moving to and from a bed to a chair (including a wheelchair)? 1 - Total - Requires total assistance or cannot do it at all.   Standing up from a chair using your arms(e.g., wheelchair or bedside chair)? 1 - Total - Requires total assistance or cannot do it at all.   To walk in a hospital room? 1 - Total - Requires total assistance or cannot do it at all.   Climbing 3-5 steps with a railing? 1 - Total - Requires total assistance or cannot do it at all.   AMPAC Mobility Score 6   TARGET Highest Level of Mobility Mobility Level 2, Turn self in bed/bed activity/dependent transfer    ACTUAL Highest Level of Mobility Mobility Level 3, Sit on edge of bed    Therapeutic Exercise  Therapeutic Exercise Comments BUE/BLE AROM; PROM BUE shld flex to ~ 100 deg    Neuromuscular Re-education  Neuromuscular Re-education Comments Static/dynamic seated PC training    Therapeutic Functional Activity  Therapeutic Functional Activity Comments Bed mobility; Pt edu    Clinical Impression  Follow up Assessment Pt seen for PT f/u. Continues to require maxA for bed mobility. Exhibits excessive R lateral lean requiring assist and cues to prevent LOB. Tolerated EOB therex and crossbody UE reaches well. Fatigued with incr unsupported sitting EOB. Will benefit from skilled services to improve gross functional ROM/strength, postural control, and activity tolerance. Current PT d/c rec is SNF for LTC.   Criteria for Skilled Therapeutic Interventions Met yes    Patient/Family Stated Goals  Patient/Family Stated Goal(s) return to prior level of functioning;decrease pain;feel better    Frequency/Equipment Recommendations  PT Frequency 2x per week   What day of week is next treatment expected? Thursday   6/22  PT/PTA completing this assessment RL   Equipment Needs During Admission/Treatment No device    PT Recommendations for Inpatient Admission  Activity/Level of Assist out of bed;dangle edge of bed;assist of 2   Positioning chair position of the bed;reposition frequently   Therapeutic Exercise ROM as tolerated   Other/Comments * Pillow between knees in supine for contracture management and prevention of skin breakdown; encourage B knee extension in supine; encourage B podus boots or shorten bed or foam to maintain 0 deg of ankle DF    Planned Treatment / Interventions  Plan for Next Visit Progress as tol   Training Treatment / Interventions Balance / Investment banker, operational;Endurance Training;Functional Personnel officer / Interventions Patient Education / Training    PT Discharge Summary  Physical Therapy Disposition Recommendation SNF for long term placement     Shanon Payor PT, DPTMHB: [phone]

## 2022-06-09 NOTE — Plan of Care
Plan of Care Overview/ Patient Status    1900 - 0700 A&Ox3. Disoriented to situation. VSS on RA. Denied pain, but pain obvious w/repositioning, scheduled gabapentin & PRN 25mg  Tramadol continued. PEG patent, no residual. Continues w/serosang/creamy tan drainage around PEG insertion site. Ostomy intact draining liquid stool. Appeared to rest comfortably between care. Awaiting LTP. Hourly rounding performed and safety maintained. Please continue to follow POC. See flow sheets for complete assessment. Problem: Adult Inpatient Plan of CareGoal: Plan of Care ReviewOutcome: Interventions implemented as appropriateFlowsheets (Taken 06/09/2022 0401)Progress: no changePlan of Care Reviewed With: patient

## 2022-06-10 NOTE — Progress Notes
[facility] HealthMedicine Progress NoteAttending Provider: Haskell Riling, MDSubjective: No acute events overnight Eating muffin this morning Review of Meds: Reviewed in chartObjective: Vitals:Temp:  [97.2 ?F (36.2 ?C)-97.5 ?F (36.4 ?C)] 97.5 ?F (36.4 ?C)Pulse:  [68-92] 68Resp:  [16-20] 20BP: (97-125)/(66-76) 110/76SpO2:  [96 %-98 %] 97 %Device (Oxygen Therapy): room airPhysical Exam:	GEN: pleasant and conversational laying in bed not in any acute distress HEENT: MMM, sclera non-icteric, EOMIPULM: breathing comfortable on room airABD: soft, PEG tube in place without discharge, ostomy in place EXT: no pitting edema ; contracted left foot/ankle with pain on range of motion of left ankle; no overt swelling or erythemaMCP with mild subluxationNEURO: moving extremities without difficulty, responding appropriately to questionsLabs:Weekly labs reviewedDiagnostics:No new imagingAssessment: 66 yo F with RA, diverticular perforation, 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 In Dec 2022, she was found to have abdominal abscess and had G tube placed for nutrition after Ethics was involved, and a STR facility was found in [place]. She was brought back to [facility] Feb 11 2022 due to ongoing encephalopathy and failure to thrive by sister, and the request was made for long term care by family. She has had extensive work up for encephalopathy, including MRI brain, L spine, paraneoplastic work up. No organic etiology identified. 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 Concern she will likely remain bed bound given prolonged course and the fact she is only getting PT 2x per week while inpatient awaiting placement. # Iron Deficiency- Continue ferrous gluconate 324 mg PO BID with breakfast and dinner# Chronic polyneuropathy- Continue gabapentin 100 mg TIDHome meds:- Continue niacin, B6, thiamine Code status: FullConserved Labs: getting weekly labs as she has been stable. Next set due around 6/25.Diet: Diet tube feed with tray (Jevity 1.5); document tube feeds q4h# Dispo: pending long term placementSigned:Margarit Minshall, MDInternal Medicine PGY-32:17 PM 06/19/23Attending attestation to follow6/19/2023

## 2022-06-10 NOTE — Plan of Care
Plan of Care Overview/ Patient Status    1900-0700:  Pt A&Ox3 d/o situation. VSS on RA. Denies SOB, CP, N/V/D. Pt c/o left leg pain, PRN Tramadol given with good effect. Scheduled meds given per order  Right sided Ileostomy +flatus and stool. Pt incontinent of urine, prn care provided, Purewick in place. PEG tube in place, dressing clean dry and intact. Bolus TF given 1 carton of Jevity 1.5.  Good PO intake. BLE elevated, podus boots in place. T&Rq2h. Pt resting in bed. Hourly rounding. Safety precautions maintained, bed alarm on, call bell in reach, will ctm and follow nursing poc. Problem: Adult Inpatient Plan of CareGoal: Plan of Care Review6/20/2023 0808 by Su Grand, RNOutcome: Interventions implemented as appropriateFlowsheets (Taken 06/10/2022 0201)Progress: no changePlan of Care Reviewed With: patient6/20/2023 0808 by Su Grand, RN

## 2022-06-10 NOTE — Plan of Care
Plan of Care Overview/ Patient Status    [name]&Ox2, vss, complains of BL LE pain, administered tramadol with positive effect. Peg tube with tube feed as scheduled. Tolerated well. PO intake good. Meals ordered and encouragement provided. Colostomy with moderate output. T&P q 2hr. Refused Podus boots.  Safety maintained, bed alarm on, call bell in reach.

## 2022-06-10 NOTE — Plan of Care
Plan of Care Overview/ Patient Status    A/Ox3. VSS on RA. Patient continues to endorse BLE pain, managed with prn tramadol and frequent repositioning with pillows/ROM exercises with good effect. PO intake improved, tolerated breakfast & lunch however refused dinner. Bolus Tfs continued per orders via PEG, minimal residual. Right ileostomy with large loose stool. LUQ fistula/stoma w/o drainage, dressing changed with vaseline gauze. Incontinent of urine, purewick in place. Bed bath given. Pt's sister Audie Pinto updated via phone by Clinical research associate. Safety maintained.

## 2022-06-10 NOTE — Plan of Care
Danielle [name]					Location: EP 97/9719-[id number] y.o., female				Attending: Daryll Drown, MD	Admit Date: 02/11/2022			[id number] LOS: 119 days FOLLOW-UP NUTRITION ASSESSMENT Dietary Orders (From admission, onward)     Start     Ordered  06/06/22 2026  Diet Tube Feed With Tray  (Diet Tube Feed Panel)  DIET EFFECTIVE NOW      Comments: JEVITY 1.5 @ 1.5 carton bolus @9am  and 1 carton BID (5pm, 9pm) to total 3.5 cartons daily Question Answer Comment Diet Selection? Regular  Safety Tray? (Suicide/Homicide/Police Custody) Safety Tray - No  Adult Tube Feed Formulas: Jevity 1.5 (YNH,SRC)  Feeding Route: G-Tube  Initiate Nutrition Management Protocol (Yes/No?) Yes - Initiate Protocol    06/06/22 2026    No Known AllergiesANTHROPOMETRICS:Height:?70Admit wt:?79.4 kg (per SNF documentation.)Current wt:?73.9 kg stable from 73.5?kg BMI:?23?Dosing wt: ?74.8 kg (normal BMI)?Additional wt information:Ht confirmed with pt:?yes1-2+?edema noted per flowsheets. Wt increase of 2.1 kg?noted since last nutrition assessment?likely d/t fluid status.?Will ctm wts. Epic wt hx:? ? Wt: 05/21/22 74.8 kg 12/13/18 98.6 kg ??ESTIMATED NUTRITION REQUIREMENTS:Kcal/day:?1870????????????????????(25 ?kcal/kg)Protein/day:?75-90??????????????(1-1.2gm/kg)Fluids/day:?1870?mL/d?(13mL/kcal) ONLY as medical and fluid status allows and only per team discretion. Needs based on:?Dosing Wt (74.8?kg), maintenance??NUTRITION ASSESSMENT:?PT EMR reviewed for follow up assessment. Meds and labs reviewed. Since last nutrition assessment pt has continued to tolerate po intake while TF is at goal.?Met with pt at bedside.?Pt endorses a good appetite and reports she is having 3 meals @?with intake ranges noted ~75% completion with no n/v/abd pain. Pt had no nutrition related questions. Suspect based?on pt report of intake with the addition of TF pt is meeting nutrition needs. Pt wt appears stable and therefore would suspect that pt is being supported on an appropriate combined feeding regimen. Nutrition will continue to monitor for the remainder of admission.?Pt recall is questionable and reported intake is not consistently reported therefore suspect pt is not eating as consistently as she reports and nursing is not documenting to support this report. Pt wt appears very stable at this time therefore suspect between tube feeds and po intake pt needs are supportive and being met  Will keep tube feed regimen as ordered at this time and reassess at next follow up.  ?Cultural/Religious/Ethnic Needs:?None?Food Allergies: NKFA?NUTRITION DIAGNOSIS:Severe malnutrition r/t depletion aeb moderate muscle and fat depletion.- continues?- if wt remains table and nutrition at supportive level would consider resolving malnutrition diagnosis.??INTERVENTIONS/RECOMMENDATIONS:?Meals and Snacks:-Continue with Regular trays-Encourage protein rich food sources with each tray ordered to better meet protein needsGoal:?po intake?for comfort given support provided by?goal?volume bolus feeds,?-continues?Enteral Nutrition:?	Continue?JEVITY 1.5?@ 1.5?carton bolus @9am ?and?1 carton?BID (5pm,?9pm)?to total?3.5?cartons daily-	Provides:?876mL, 1244 kcal,?53gm protein,? free waterGoal: ?Patient will receive >90% of goal volume TF/ TPN prior to next nutrition assessment-suspect met, continue?Discharge Planning and Transfer of Nutrition Care:??Nutrition related discharge needs still being determined at this time, will continue to follow ??MONITORING / EVALUATION:?Food/Nutrition-Related History:Food and Nutrient IntakeEnteral and Parenteral Nutrition Intake?Anthropometric Measurements:Weight ?Biochemical Data, Medical Tests and Procedures:Electrolyte and renal profileGlucose/endocrine profile?Nutrition-Focus Physical FindingsOverall findings?Electronically signed by Sherrilee Gilles, RD, June 20, 2023[facility]- [phone] Please note: Nutrition is a consult only service on weekends and holidays.  Please enter a consult in EPIC if assistance is needed on a weekend or holiday or via [facility] [phone] to contact the covering RD.

## 2022-06-11 NOTE — Progress Notes
[facility]		    [facility]													Internal Medicine Progress NoteInpatient Attending: Daryll Drown, MDHospital Day: 120Interval Events SubjectiveNo acute events overnight. Appetite has improved and patient is eating breakfast. Denies any chest or abdominal pain. Meds: Reviewed in chart Objective: Vitals:Temp:  [97.5 ?F (36.4 ?C)-98 ?F (36.7 ?C)] 98 ?F (36.7 ?C)Pulse:  [73-77] 77Resp:  [17-20] 20BP: (97-109)/(67-71) 97/68SpO2:  [96 %-98 %] 98 %Device (Oxygen Therapy): room air I/O's:Intake/Output Summary (Last 24 hours) at 06/11/2022 1317Last data filed at 06/11/2022 0900Gross per 24 hour Intake 597 ml Output 200 ml Net 397 ml  Physical Exam:Gen: Well appearing, no acute distressCV: Regular rate and rhythm, no murmurs, rubs, or gallopsPulm: Breathing comfortably on room air Abd: soft, nontender, ostomy in place, PEG tube in place without discharge Ext: Warm, well-perfused, contracted left foot/ankle with limited ROM in lower extremities, particularly left ankle. Neuro: Responds to questions appropriately, no focal deficits appreciated. Labs: Recent Labs Lab 06/18/230550 WBC 6.6 HGB 12.1 HCT 38.30 PLT 324  Recent Labs Lab 06/18/230550 NEUTROPHILS 51.5  Recent Labs Lab 06/18/230550 NA 139 K 4.2 CL 105 CO2 23 BUN 13 CREATININE 0.35* GLU 95 ANIONGAP 11  Recent Labs Lab 06/18/230550 CALCIUM 10.9* MG 2.0  No results for input(s): ALT, AST, ALKPHOS, BILITOT, BILIDIR in the last 168 hours. No results for input(s): PTT, LABPROT, INR in the last 168 hours. 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 In Dec 2022, she was found to have abdominal abscess and had G tube placed for nutrition after Ethics was involved, and a STR facility was found in [place]. She was brought back to [facility] Feb 11 2022 due to ongoing encephalopathy and failure to thrive by sister, and the request was made for long term care by family. ?She has had extensive work up for encephalopathy, including MRI brain, L spine, paraneoplastic work up. No organic etiology identified. 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 Concern she will likely remain bed bound given prolonged course and the fact she is only getting PT 2x per week while inpatient awaiting placement. ?# Iron Deficiency- Continue ferrous gluconate 324 mg PO BID with breakfast and dinner?# Chronic polyneuropathy- Continue gabapentin 100 mg TIDHome meds:- Continue niacin, B6, thiamine Signed:Trinia Georgi, MD PGY-1Available on MHBJune 21, 20231:17 PM

## 2022-06-11 NOTE — Plan of Care
Plan of Care Overview/ Patient Status    1900-0700: A/o x4, forgetful, VSS on room air. C/o b/l foot pain, prn tramadol given with positive effect. Denies SOB/chest pain. ostomy in place, CDI, purewick in place, incontinence care provided as needed. Peg tube patent, dressing changed this shift, 9 PM jevity bolus feed given. B/l lower extremity edema present, heels offloaded, please see flowsheet for full skin assessment. Pills whole. Assist x 2 in bed. T & R Q2 hours. Hourly rounding completed, safety maintained. Call bell within reach.

## 2022-06-11 NOTE — Plan of Care
Plan of Care Overview/ Patient Status    LOS Note Day 120Problem: Adult Inpatient Plan of CareGoal: Readiness for Transition of CareOutcome: Interventions implemented as appropriate Patient remains medically stable for discharge to SNF for Long term, however their are no accepting facilities. Patient's sister [name] has filed and was granted conservatorship. T-19 has been submitted, however is missing additional documentation before it can be approved. There is a SSA appointment on June 23rd to obtain additional financial documentation. Care Manager will continue to follow pt as she progresses towards discharge. Willy Eddy, LMSW  Care Management [facility] [phone]

## 2022-06-11 NOTE — Progress Notes
Please see full note per Dr Gae Gallop.  Pt seen and examined and discussed with Dr. Jannet Mantis and team.  Data and plans reviewed.Doing well on bagel day today.  Hungry and believes not getting tube feeds any longer but RN note at least from last night shows TF still running.  However, Pharm student says no sign of TF order on MAR.Looks well and very interactive.  She cannot circumduct her LEFT foot at the ankle and in fact it is painful for her to do so.  Pharm student Nissen pointed out that Ms. M is taking TUMS TID, so before we discuss her elevated calcium levels suspect we should stop the TUMS.  I DO expect that her Calcium levels remain elevatedd.I'd like to taper all PEG feeding if Ms. Judie Petit has an appetite to do so.  We are progressing in that she seems to be eating more po.Mind clear today.  LOOOOONG way back with PT.  Working on placement.PL Robb Matar

## 2022-06-11 NOTE — Progress Notes
[facility] HealthMedicine Progress NoteAttending Provider: Daryll Drown, MDSubjective: No acute events overnight Asking about bagel this morning, has an appetiteDenies any chest pain, abdominal painReview of Meds: Reviewed in chartObjective: Vitals:Temp:  [97.9 ?F (36.6 ?C)-98.4 ?F (36.9 ?C)] 98.1 ?F (36.7 ?C)Pulse:  [63-82] 67Resp:  [18-20] 20BP: (106-110)/(65-80) 106/65SpO2:  [97 %-98 %] 97 %Device (Oxygen Therapy): room airPhysical Exam:	GEN: pleasant and conversational laying in bed not in any acute distress HEENT: MMM, sclera non-icteric, EOMIPULM: breathing comfortable on room airABD: soft, PEG tube in place without discharge, ostomy in place EXT: no pitting edema ; contracted left foot/ankle with pain on range of motion of left ankle; no overt swelling or erythemaMCP with mild subluxationNEURO: moving extremities without difficulty, responding appropriately to questionsLabs:Weekly labs reviewedDiagnostics:No new imagingAssessment: 66 yo F with RA, diverticular perforation, 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 In Dec 2022, she was found to have abdominal abscess and had G tube placed for nutrition after Ethics was involved, and a STR facility was found in [place]. She was brought back to [facility] Feb 11 2022 due to ongoing encephalopathy and failure to thrive by sister, and the request was made for long term care by family. She has had extensive work up for encephalopathy, including MRI brain, L spine, paraneoplastic work up. No organic etiology identified. 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 Concern she will likely remain bed bound given prolonged course and the fact she is only getting PT 2x per week while inpatient awaiting placement. # Iron Deficiency- Continue ferrous gluconate 324 mg PO BID with breakfast and dinner# Chronic polyneuropathy- Continue gabapentin 100 mg TIDHome meds:- Continue niacin, B6, thiamine Code status: FullConserved Labs: getting weekly labs as she has been stable. Next set due around 6/25.Diet: Diet tube feed with tray (Jevity 1.5); document tube feeds q4h# Dispo: pending long term placementSigned:Jerral Ralph, MDPsychiatry PGY-1Attending attestation to follow6/20/2023

## 2022-06-11 NOTE — Plan of Care
Plan of Care Overview/ Patient Status    [name]&Ox2, vss, complains of BL LE pain, administered tramadol with positive effect. Peg tube with tube feed as scheduled. Tolerated well. PO intake good. Meals ordered and encouragement provided. Colostomy with moderate output. T&P q 2hr. Refused Podus boots.  Safety maintained, bed alarm on, call bell in reach.

## 2022-06-12 NOTE — Plan of Care
NUTRITION NOTE:Verbal Consult received from MD. Pt noted to be eating better and expressing more interested in eating po diet. Plan will be to hold tube feeds for 2 days and conduct a closely monitored calorie count. Suspect pt is not eating as well as she reports as wt has been very stable on combined feeding regimen. It is possible that with tube feeds off that po intake will improve and be fully supportive. Plan; 1. will stop feeds and hold for 2 days starting today. 2. Will conduct a 2 day calorie count assessment and follow up 6/24 with Team for additional recommendations. 3. Will add Ensure Plus supplement to help support po diet advancement.

## 2022-06-12 NOTE — Plan of Care
Inpatient Physical Therapy Progress Note IP Adult PT Eval/Treat - 06/12/22 0850    Date of Visit / Treatment  Date of Visit / Treatment 06/12/22   Note Type Progress Note   Progress Report Due 07/02/22   End Time 0850    General Information  Subjective That's the trouble maker.   General Observations Pt received supine in bed; on RA; NAD; ostomy; purwick; PEG; RN cleared   Precautions/Limitations Fall Precautions;Bed alarm    Weight Bearing Status  Weight Bearing Status Comments WBAT BLE    Vital Signs and Orthostatic Vital Signs  Vital Signs Vital Signs Stable   Vital Signs Free text RA; NAD    Pain/Comfort  Pain Comment (Pre/Post Treatment Pain) Pt c/o primarily unquantified L posterior knee pain with mobilization; Pt tearful d/t pain; RN aware and repositioned for comfort    Patient Coping  Observed Emotional State accepting;anxious;cooperative;tearful/crying   Verbalized Emotional State acceptance;hopelessness    Cognition  Overall Cognitive Status WFL   Orientation Level Oriented X4   Level of Consciousness alert   Following Commands Follows one step commands consistently   Personal Safety / Judgment Fall risk;Requires supervision with mobility   Cognition Comments Able to hold logical conservation regarding world news; makes hopeless statements at times requiring reassurance    Range of Motion  Range of Motion Examination WFL except   Range of Motion Comments bilateral knees lacking ~20 degrees of full extension, B ankles beginning plantar flexion contractures, lacking 20 degrees to neutral passively, LUE WFL passively, RUE limited to ~ 100 deg PROM shld flexion    Manual Muscle Testing  Manual Muscle Testing Comments + Deconditioned    Skin Assessment  Skin Assessment See Nursing Documentation    Posture, Head/Trunk Alignment  Posture, Head/Trunk Alignment Right lateral lean;Rounded shoulders;Kyphotic Balance  Sitting Balance: Static  POOR+    Minimal assist to maintain static position with no Assistive Device   Sitting Balance: Dynamic  POOR     Moves through 1/4 to 1/2 ROM range with moderate assist to right self   Balance Assist Device Hand held assist   Balance Skills Training Comment Ax1    Bed Mobility  Supine-to-Sit Independence/Assistance Level Maximum assist;Assist of 1   Supine-to-Sit Assist Device Hand held assist;Head of bed elevated;Draw pad   Sit-to-Supine Independence/Assistance Level Maximum assist;Assist of 1   Sit-to-Supine Assist Device Bed rails;Head of bed elevated;Hand held assist   Limitations decreased ability to use legs for bridging/pushing   Bed Mobility, Impairments pain;ROM decreased;strength decreased;postural control impaired   Bed Mobility Comments Less R lateral lean although still present; cues for upright performing therex and PC training EOB    Sit-Stand Transfer Training  Sit-Stand Transfer Comments Deferred d/t BLE weakness, unsteadiness, and incr need for assist    Handoff Documentation  Handoff Patient in bed;Bed alarm;Patient instructed to call nursing for mobility;Discussed with nursing   Handoff Comments Promote terminal knee extension in supine, pillow between knees for contracture and skin breakdown prevention; foam wedge at end of bed to promote neutral ankle DF    Endurance  Endurance Comments Poor+    PT- AM-PAC - Basic Mobility Screen- How much help from another person do you currently need.....  Turning from your back to your side while in a a flat bed without using rails? 1 - Total - Requires total assistance or cannot do it at all.   Moving from lying on your back to sitting on the side of a flat bed without using  bed rails? 1 - Total - Requires total assistance or cannot do it at all.   Moving to and from a bed to a chair (including a wheelchair)? 1 - Total - Requires total assistance or cannot do it at all.   Standing up from a chair using your arms(e.g., wheelchair or bedside chair)? 1 - Total - Requires total assistance or cannot do it at all.   To walk in a hospital room? 1 - Total - Requires total assistance or cannot do it at all.   Climbing 3-5 steps with a railing? 1 - Total - Requires total assistance or cannot do it at all.   AMPAC Mobility Score 6   TARGET Highest Level of Mobility Mobility Level 2, Turn self in bed/bed activity/dependent transfer    ACTUAL Highest Level of Mobility Mobility Level 3, Sit on edge of bed    Therapeutic Exercise  Therapeutic Exercise Comments BUE/BLE AROM; PROM BUE shld flex to ~ 100 deg    Neuromuscular Re-education  Neuromuscular Re-education Comments Static/dynamic seated PC training    Therapeutic Functional Activity  Therapeutic Functional Activity Comments Bed mobility; Pt edu    Clinical Impression  Follow up Assessment Pt seen for PT f/u. Continues to require maxA for bed mobility. noted less R lateral lean, although still present requiring assist to correct. Tolerated EOB therex and crossbody UE reaches well. Fatigued with incr unsupported sitting EOB. Will benefit from skilled services to improve gross functional ROM/strength, postural control, and activity tolerance. Current PT d/c rec is SNF for LTC.   Criteria for Skilled Therapeutic Interventions Met yes    Patient/Family Stated Goals  Patient/Family Stated Goal(s) return to prior level of functioning;decrease pain    Frequency/Equipment Recommendations  PT Frequency 2x per week   What day of week is next treatment expected? Monday   6/26  PT/PTA completing this assessment RL   Equipment Needs During Admission/Treatment No device    PT Recommendations for Inpatient Admission  Activity/Level of Assist out of bed;dangle edge of bed;assist of 2   Positioning chair position of the bed;reposition frequently   Therapeutic Exercise ROM as tolerated Other/Comments * Pillow between knees in supine for contracture management and prevention of skin breakdown; encourage B knee extension in supine; encourage B podus boots or shorten bed or foam to maintain 0 deg of ankle DF    Planned Treatment / Interventions  Plan for Next Visit Progress as tol   Training Treatment / Interventions Balance / Investment banker, operational;Endurance Training;Functional Personnel officer / Interventions Patient Education / Training    PT Discharge Summary  Physical Therapy Disposition Recommendation SNF for long term placement     Shanon Payor PT, DPTMHB: [phone]

## 2022-06-12 NOTE — Plan of Care
Plan of Care Overview/ Patient Status    A&Ox2, vss, complains of BL LE pain, administered tramadol with positive effect. Peg tube with tube feed as scheduled. Tolerated well. PO intake good. Meals ordered and encouragement provided. Colostomy with moderate output. T&P q 2hr. Refused Podus boots.  Safety maintained, bed alarm on, call bell in reach.

## 2022-06-12 NOTE — Plan of Care
Plan of Care Overview/ Patient Status    1900-0700: A/o x4, forgetful, VSS on room air. C/o b/l foot pain, prn tramadol given with positive effect. Denies SOB/chest pain. ostomy in place, CDI, purewick in place, incontinence care provided as needed. Peg tube patent, dressing changed this shift, 9 PM jevity bolus feed given. B/l lower extremity edema present, heels offloaded, please see flowsheet for full skin assessment. Pills whole. Assist x 2 in bed. T & R Q2 hours. Hourly rounding completed, safety maintained. Call bell within reach.

## 2022-06-13 MED ORDER — LIDOCAINE 4 % TOPICAL CREAM
4 % | Freq: Two times a day (BID) | TOPICAL | Status: DC | PRN
Start: 2022-06-13 — End: 2022-06-30
  Administered 2022-06-15 – 2022-06-21 (×3): 4 % via TOPICAL

## 2022-06-13 NOTE — Progress Notes
[facility]		    [facility]													Internal Medicine Progress NoteInpatient Attending: Daryll Drown, MDHospital Day: 121Interval Events SubjectiveNo acute events overnight. Appetite has improved and patient is motivated to stop tube feeds and aim for full PO intake. She had some pain at bilateral lower extremities overnight that improved with PRN 25 mg tramadol. Meds: Reviewed in chart Objective: Vitals:Temp:  [97.7 ?F (36.5 ?C)-98.5 ?F (36.9 ?C)] 98.5 ?F (36.9 ?C)Pulse:  [67-87] 87Resp:  [16-17] 16BP: (101-106)/(64-71) 105/64SpO2:  [95 %-99 %] 95 %Device (Oxygen Therapy): room air I/O's:Intake/Output Summary (Last 24 hours) at 06/12/2022 1634Last data filed at 06/12/2022 1401Gross per 24 hour Intake 1497 ml Output 400 ml Net 1097 ml  Physical Exam:Gen: Well appearing, no acute distressCV: Regular rate and rhythm, no murmurs, rubs, or gallopsPulm: Breathing comfortably on room air. Lungs clear to auscultation on anterior fields Abd: soft, nontender, ostomy in place, PEG tube in place without discharge Ext: Warm, well-perfused, contracted left foot/ankle with limited ROM in lower extremities, particularly left ankle. Neuro: Responds to questions appropriately, no focal deficits appreciated. Labs: Reviewed in chart. Of note hypercalcemia of 10.9 on 6/18 with does not appear symptomatic. 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 In Dec 2022, she was found to have abdominal abscess and had G tube placed for nutrition after Ethics was involved, and a STR facility was found in [place]. She was brought back to [facility] Feb 11 2022 due to ongoing encephalopathy and failure to thrive by sister, and the request was made for long term care by family. ?She has had extensive work up for encephalopathy, including MRI brain, L spine, paraneoplastic work up. No organic etiology identified. The decision has been made to await title 19 and long term placement with family.#Nutrition w/ G tube in place- Appreciate dietician input- Encourage PO intake with calorie counts- Hold tube feeds for 2 days to assess PO nutritional capacity. 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 Concern she will likely remain bed bound given prolonged course and the fact she is only getting PT 2x per week while inpatient awaiting placement. ?# Iron Deficiency- Continue ferrous gluconate 324 mg PO BID with breakfast and dinner?# Chronic polyneuropathy- Continue gabapentin 100 mg TIDHome meds:- Continue niacin, B6, thiamine Signed:Lahela Woodin, MD PGY-1Available on MHBJune 22, 20231:17 PM

## 2022-06-13 NOTE — Plan of Care
Plan of Care Overview/ Patient Status    Per Marney Setting, he received call from patient's Sister/Conservator [name], who stated patient's Medicaid has been approved and provided Medicaid number: [id number]. Per [name] she is requesting referral to be sent to [facility] for [facility] and noted Baxter Kail at [place] for LTC; referrals sent for review. CM will continue to follow and assist. Melina Copa, MSW, LCSWCare [phone]

## 2022-06-13 NOTE — Plan of Care
Plan of Care Overview/ Patient Status    1900-0700 Alert and oriented x3. Disoriented to situation. VSS on RA. PRN tramadol and tylenol given for BLE pain with good effect. PEG clamped. Bolus TF held per order. Calorie count continued. Pt on regular diet. Right ileostomy intact. Purewick in place. Incontinence care provided as needed. Awaiting T19/LTP. Turned and repositioned q2h. Safety maintained. Hourly rounding completed. See flowsheets for assessments.Problem: Adult Inpatient Plan of CareGoal: Plan of Care ReviewOutcome: Interventions implemented as appropriateFlowsheets (Taken 06/12/2022 2245)Progress: no changePlan of Care Reviewed With: patient

## 2022-06-13 NOTE — Care Coordination-Inpatient
Follow up call received from patients sister [name] she informed me that patient Albornoz is now active with [facility] she provided UJ#[id number].[name] requested that we look into [employer] for [employer] and the [facility] for the LTC we discussed the challenges with a DC plan she is suggesting which [name] states she understood referrals have been placed for review we are currently awaiting responses.Care management will continue to follow along with the patient's medical team as needed.Troy JonesTransition CoordinatorCare Management ([facility])Ph#[phone]#[phone]#[phone]

## 2022-06-13 NOTE — Plan of Care
Plan of Care Overview/ Patient Status    [name]&Ox3,  vss, complains of BL LE pain, administered tramadol with positive effect. Peg tube patent.  Tolerated well. PO intake?good. Meals ordered and encouragement provided. Colostomy with moderate output. T&P q 2hr.?Refused?Podus boots.??Safety maintained, bed alarm on, call bell in reach.

## 2022-06-13 NOTE — Plan of Care
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 In Dec 2022, she was found to have abdominal abscess and had G tube placed for nutrition after Ethics was involved, and a STR facility was found in [place]. She was brought back to [facility] Feb 11 2022 due to ongoing encephalopathy and failure to thrive by sister, and the request was made for long term care by family. She has had extensive work up for encephalopathy, including MRI brain, L spine, paraneoplastic work up. No organic etiology identified. The decision has been made to await title 19 and long term placement with family.   Subjective I want to try   General Observations Pt received in bed on 97, NAD, agreeable to OT.   Precautions/Limitations Fall Precautions;Bed alarm   Hand Dominance right    Weight Bearing Status  Weight Bearing Status WNL - Within normal limits    Vital Signs and Orthostatic Vital Signs  Vital Signs Vital Signs Stable    Pain/Comfort  Pain Comment (Pre/Post Treatment Pain) Pt reporting pain in LLE/ heel, RN aware, repositioned and heel off loaded. Attempted to don multi podus boot which was present in room however pt refused despite edu. Pt prefers the wedge to promote dorsiflexion    Patient Coping  Observed Emotional State cooperative    Cognition  Orientation Level Oriented to person;Oriented to place;Oriented to time   Level of Consciousness alert   Following Commands Follows one step commands with repetition   Personal Safety / Judgment Decreased recognition / insight of own deficits    Range of Motion  Range of Motion Examination bilateral upper extremity ROM was [facility]   Range of Motion Comments AAROM RUE    Musculoskeletal  LUE Muscle Strength Grading 3-->active movement against gravity   RUE Muscle Strength Grading 3-->active movement against gravity   except shoulder 3-/5   Coordination  Opposition WFL - Within Functional Limits   Coordination Comments GMC delayed and limited by R shoulder weakness    Sensory Assessment  Sensory Assessment Comments BLEs sensitive to touch    Skin Assessment  Skin Assessment See Nursing Documentation    Balance  Balance Skills Training Comment Pt refused sitting EOB due to pain in LLE    Grooming  Independence/Assistance Level Set-up required   Grooming Activities brush teeth     AM-PAC - Daily Activity IP Short Form  Help needed from another person putting on/taking off regular lower body clothing 1 - Unable   Help needed from another person for bathing (incl. washing, rinsing, drying) 2 - A Lot   Help needed from another person for toileting (incl. using toilet, bedpan, urinal) 1 - Unable   Help needed from another person putting on/taking off regular upper body clothing 2 - A Lot   Help needed from another person taking care of personal grooming such as brushing teeth 3 - A Little   Help needed from another person eating meals 3 - A Little   AM-PAC Daily Activity Raw Score (Total of rows above) 12   CMS Score (based on Raw Score -  with G Code) 12 - 66.57% impaired      (G Code - CL)    Bed Mobility  Bed Mobility Comments Pt w/ R listing preference in bed, worked on having pt self correct w/ use of bed rails throughout session, LLE positioned to promote neutral alignment and maintained throughout session w/ effort due to some pain; also worked on core strengthening w/ trunk flexion in bed using bed rails and holds    Handoff Documentation Handoff Patient in bed;Bed alarm;Patient instructed to call nursing for mobility;Discussed with nursing   Handoff Comments All needs in reach    Therapeutic Exercise  Therapeutic Exercise Comments UE therex; AAROM RUE/AROM LUE x 10 reps and AAROM/PROM BLEs x 10 reps    Clinical Impression  Follow up Assessment Pt tolerated fair. Declined OOB due to LLE pain. Participated in therex, grooming, and core strengthening. Pt on HD 122, OT following 2x/wk. Encourage ADL participation and therex as able.    Frequency/Equipment Recommendations  OT Frequency 2x per week   What day of week is next treatment expected? Tuesday   6/27   OT Recommendations for Inpatient Admission  ADL Recommendations assist of 2   bedlevel ADLs; hoyer OOB   Planned Treatment / Interventions  Education Treatment / Interventions Patient Education / Training    OT Discharge Summary  Disposition Recommendation Short Term Rehab     Danielle Brigandi MS OTR/L

## 2022-06-14 MED ORDER — FERROUS GLUCONATE 324 MG (38 MG IRON) TABLET
324 mg (38 mg iron) | ORAL | Status: DC
Start: 2022-06-14 — End: 2022-08-15
  Administered 2022-06-15 – 2022-08-14 (×31): 324 mg (38 mg iron) via ORAL

## 2022-06-14 NOTE — Plan of Care
Plan of Care Overview/ Patient Status    1900-0700: A&Ox3, disoriented to time. VSS on RA. Complaint of BLE pain, prn medication refused, positioned. Regular diet. Incontinent of urine. Purewick in place, changed this shift. Colostomy in place, draining. Ax1 in bed. Pills whole. No acute events this shift. Safety maintained. Call bell in reach. Bed alarm on. Hourly rounding completed.

## 2022-06-14 NOTE — Progress Notes
[facility]		    [facility]													Internal Medicine Progress NoteInpatient Attending: Daryll Drown, MDHospital Day: 123Interval Events SubjectiveNo acute events overnight. Appetite has improved and patient continuing to eat PO. Still complains of moderate pain at lower extremities but has not yet received topical lidocaine. Meds: Reviewed in chart Objective: Vitals:Temp:  [97.4 ?F (36.3 ?C)-98.3 ?F (36.8 ?C)] 97.4 ?F (36.3 ?C)Pulse:  [62-65] 65Resp:  [20] 20BP: (94-106)/(58-69) 106/68SpO2:  [96 %-99 %] 96 %Device (Oxygen Therapy): room air I/O's:Intake/Output Summary (Last 24 hours) at 06/14/2022 1816Last data filed at 06/14/2022 0800Gross per 24 hour Intake 300 ml Output 1725 ml Net -1425 ml  Physical Exam:Gen: Well appearing, no acute distressCV: Regular rate and rhythm, no murmurs, rubs, or gallopsPulm: Breathing comfortably on room air. Lungs clear to auscultation on anterior fields Abd: soft, nontender, ostomy in place, PEG tube in place without discharge Ext: Warm, well-perfused, contracted left foot/ankle with limited ROM in lower extremities, particularly left ankle and exquisite to touch. Neuro: Responds to questions appropriately, no focal deficits appreciated. Labs: Reviewed in chart. Of note hypercalcemia of 10.9 on 6/18 with does not appear symptomatic. 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 In Dec 2022, she was found to have abdominal abscess and had G tube placed for nutrition after Ethics was involved, and a STR facility was found in [place]. She was brought back to [facility] Feb 11 2022 due to ongoing encephalopathy and failure to thrive by sister, and the request was made for long term care by family. ?She has had extensive work up for encephalopathy, including MRI brain, L spine, paraneoplastic work up. No organic etiology identified. The decision has been made to await title 19 and long term placement with family. She has been approved for medicaid and in process of finding long term facility. 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 Concern she will likely remain bed bound given prolonged course and the fact she is only getting PT 2x per week while inpatient awaiting placement. - Provide topical lidocaine PRN for affected area?# Iron Deficiency- Continue ferrous gluconate 324 mg PO BID with breakfast and dinner?# Chronic polyneuropathy- Continue gabapentin 100 mg TIDHome meds:- Continue niacin, B6, thiamine Signed:Christasia Angeletti, MD PGY-1Available on MHBJune 24, 20231:17 PM

## 2022-06-14 NOTE — Plan of Care
NUTRITION NOTE:Pt reviewed EMR for calorie count assessment in setting of holding feeds to assess ability to increase po intake. Pt appears to have had a very good day on 6/22 and suspect had a good day on 6/23 however only breakfast was documented despite receiving TID meals. On 6/22 pt consumed a total of 1945kcal, and 80g protein between TID meals. If she can maintain this consistently she would not require tube feeds at least while admitted. Unless supported at a high level after discharge pt not likely able to maintain her won nutrition quality of life. Pt needs 1870kcal and 75-90g pro/d to meet needs. Would like to continue calorie count assessment over this weekend and reassess on Monday 6/26 to determine if the 6/22 could potentially be a new baseline. Would stress to nursing the importance of maintaining accurate and complete calorie counts in this instance to allow an accurate assessment of her ability to come off tube feeds. As pt has a PEG and was previously on  bolus feeds it may not be unreasonable to think she can take the bulk of her nutrition needs through a po diet with supplementation with Jevity 1.5 1-3 servings daily depending on intake trends.Will follow up on Monday 6/26 for cal count results. Continue to hold tube feeds at this time. Thank you.

## 2022-06-14 NOTE — Plan of Care
Plan of Care Overview/ Patient Status    0700-1900Patient is A&Ox2-3, disoriented to time and situation. Complains of left leg pain, PRN tylenol administered per order. Takes pills whole. PEG in place, TF held. Ileostomy bag. Incontinent, purewick in place. AX2 turn and reposition Q2H, specialty bed in place. Safety checks and hourly rounding performed, see flow sheet and MAR for details.Problem: Adult Inpatient Plan of CareGoal: Plan of Care ReviewOutcome: Interventions implemented as appropriateGoal: Patient-Specific Goal (Individualized)Outcome: Interventions implemented as appropriateGoal: Absence of Hospital-Acquired Illness or InjuryOutcome: Interventions implemented as appropriateGoal: Optimal Comfort and WellbeingOutcome: Interventions implemented as appropriateGoal: Readiness for Transition of CareOutcome: Interventions implemented as appropriate Problem: InfectionGoal: Absence of Infection Signs and SymptomsOutcome: Interventions implemented as appropriate Problem: Physical Therapy GoalsGoal: Physical Therapy GoalsDescription: PT GOALS - addressed 05/02/22.1. Patient will demonstrate active range of motion x 4 extremities in preparation for mobility (GOAL MET)2. Caregivers will be independent and safely implement a range of motion/positioning program to prevent loss of range of motion and skin integrity (ONGOING)3. Patient will perform bed mobility with maximal assist (GOAL MET)4. Patient will tolerate sitting edge of bed >5 minutes with minimal assist (GOAL MET)5. Patient will perform transfers with moderate assist of 2 using rolling walker (ONGOING)6. Pt will perform bed mobility w/ modAx1 (ONGOING)7. Pt will perform STS transfer w/ modAx2 for a standing tolerance of 1 minute. (NEW) Outcome: Interventions implemented as appropriate Problem: Occupational Therapy GoalsGoal: Occupational Therapy GoalsDescription: OT goals1. Pt will move to EOB for seated ADL mod I2. Pt will follow 3/4 simple one step motor commands IND3. Pt will sequence through set up ADL task INDOT re-eval, Initial goals not met, continue to progress towards initial goalsOutcome: Interventions implemented as appropriate Problem: Speech Language Pathology GoalsGoal: Dysphagia Goals, SLPDescription: Current goal: Patient will tolerate regular solids and thin liquids without adverse impact on respiratory status and while maintaining adequate oral nutrition.Prior goal (met): The patient will tolerate least restrictive diet without any decline in respiratory status.Prior goal (met): Pt will tolerate pureed diet with thin liquids w/o s/x of aspiration or adverse impact on respiratory status.Outcome: Interventions implemented as appropriate Problem: Impaired Wound HealingGoal: Optimal Wound HealingOutcome: Interventions implemented as appropriate Problem: Fall Injury RiskGoal: Absence of Fall and Fall-Related InjuryOutcome: Interventions implemented as appropriate Problem: Skin Injury Risk IncreasedGoal: Skin Health and IntegrityOutcome: Interventions implemented as appropriate Problem: Mobility ImpairmentGoal: Optimal MobilityOutcome: Interventions implemented as appropriate Problem: Aspiration (Enteral Nutrition)Goal: Absence of Aspiration Signs and SymptomsOutcome: Interventions implemented as appropriate Problem: Device-Related Complication Risk (Enteral Nutrition)Goal: Safe, Effective Therapy DeliveryOutcome: Interventions implemented as appropriate Problem: Self-Care DeficitGoal: Improved Ability to Complete Activities of Daily LivingOutcome: Interventions implemented as appropriate Problem: Fluid, Electrolyte and Nutrition Imbalance (Ileostomy)Goal: Fluid, Electrolyte and Nutrition BalanceOutcome: Interventions implemented as appropriate Problem: Infection (Ileostomy)Goal: Absence of Infection Signs and SymptomsOutcome: Interventions implemented as appropriate Problem: Confusion ChronicGoal: Optimal Cognitive FunctionOutcome: Interventions implemented as appropriate

## 2022-06-14 NOTE — Progress Notes
[facility]		    [facility]													Internal Medicine Progress NoteInpatient Attending: Daryll Drown, MDHospital Day: 122Interval Events SubjectiveNo acute events overnight. Appetite has improved and patient continuing to eat PO. She complains of moderate pain at bilateral lower extremities in paroxysms overnight not responsive to tylenol and tramadol. Pain also with movement of left lower extremity and worse at heel. Meds: Reviewed in chart Objective: Vitals:Temp:  [97.6 ?F (36.4 ?C)-99 ?F (37.2 ?C)] 97.6 ?F (36.4 ?C)Pulse:  [67-74] 74Resp:  [17-20] 20BP: (99-105)/(65-69) 105/69SpO2:  [96 %-97 %] 97 %Device (Oxygen Therapy): room air I/O's:Intake/Output Summary (Last 24 hours) at 06/13/2022 1840Last data filed at 06/13/2022 1600Gross per 24 hour Intake 440 ml Output 600 ml Net -160 ml  Physical Exam:Gen: Well appearing, no acute distressCV: Regular rate and rhythm, no murmurs, rubs, or gallopsPulm: Breathing comfortably on room air. Lungs clear to auscultation on anterior fields Abd: soft, nontender, ostomy in place, PEG tube in place without discharge Ext: Warm, well-perfused, contracted left foot/ankle with limited ROM in lower extremities, particularly left ankle and exquisite to touch. Neuro: Responds to questions appropriately, no focal deficits appreciated. Labs: Reviewed in chart. Of note hypercalcemia of 10.9 on 6/18 with does not appear symptomatic. 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 In Dec 2022, she was found to have abdominal abscess and had G tube placed for nutrition after Ethics was involved, and a STR facility was found in [place]. She was brought back to [facility] Feb 11 2022 due to ongoing encephalopathy and failure to thrive by sister, and the request was made for long term care by family. ?She has had extensive work up for encephalopathy, including MRI brain, L spine, paraneoplastic work up. No organic etiology identified. The decision has been made to await title 19 and long term placement with family.#Nutrition w/ G tube in place- Appreciate dietician input- Encourage PO intake with calorie counts- Continue to hold tube feeds for 2 days to assess PO nutritional capacity. 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 Concern she will likely remain bed bound given prolonged course and the fact she is only getting PT 2x per week while inpatient awaiting placement. - Provide topical lidocaine PRN for affected area?# Iron Deficiency- Continue ferrous gluconate 324 mg PO BID with breakfast and dinner?# Chronic polyneuropathy- Continue gabapentin 100 mg TIDHome meds:- Continue niacin, B6, thiamine Signed:Janal Haak, MD PGY-1Available on MHBJune 23, 20231:17 PM

## 2022-06-15 LAB — CALCIUM: BKR CALCIUM: 10.5 mg/dL — ABNORMAL HIGH (ref 8.8–10.2)

## 2022-06-15 LAB — MANUAL DIFFERENTIAL
BKR WAM BASOPHIL - ABS (DIFF) 2 DEC: 0 x 1000/ÂµL (ref 0.00–1.00)
BKR WAM BASOPHILS (DIFF): 0 % (ref 0.0–1.4)
BKR WAM EOSINOPHILS (DIFF) 2 DEC: 0.27 x 1000/ÂµL (ref 0.00–1.00)
BKR WAM EOSINOPHILS (DIFF): 5.1 % — ABNORMAL HIGH (ref 0.0–5.0)
BKR WAM LYMPHOCYTE - ABS (DIFF) 2 DEC: 1.82 x 1000/ÂµL (ref 0.60–3.70)
BKR WAM LYMPHOCYTES (DIFF): 35 % (ref 17.0–50.0)
BKR WAM MONOCYTE - ABS (DIFF) 2 DEC: 0.54 x 1000/ÂµL (ref 0.00–1.00)
BKR WAM MONOCYTES (DIFF): 10.3 % (ref 4.0–12.0)
BKR WAM NEUTROPHILS (DIFF): 49.6 % (ref 39.0–72.0)
BKR WAM NEUTROPHILS - ABS (DIFF) 2 DEC: 2.58 x 1000/ÂµL (ref 2.00–7.60)
BKR WAM NORMAL RBC MORPHOLOGY: NORMAL

## 2022-06-15 LAB — MAGNESIUM: BKR MAGNESIUM: 2 mg/dL (ref 1.7–2.4)

## 2022-06-15 LAB — BASIC METABOLIC PANEL
BKR ANION GAP: 9 (ref 7–17)
BKR BLOOD UREA NITROGEN: 10 mg/dL (ref 8–23)
BKR BUN / CREAT RATIO: 26.3 — ABNORMAL HIGH (ref 8.0–23.0)
BKR CALCIUM: 10.5 mg/dL — ABNORMAL HIGH (ref 8.8–10.2)
BKR CHLORIDE: 109 mmol/L — ABNORMAL HIGH (ref 98–107)
BKR CO2: 22 mmol/L (ref 20–30)
BKR CREATININE: 0.38 mg/dL — ABNORMAL LOW (ref 0.40–1.30)
BKR EGFR, CREATININE (CKD-EPI 2021): >60 mL/min/{1.73_m2} (ref >=60)
BKR GLUCOSE: 92 mg/dL (ref 70–100)
BKR POTASSIUM: 3.9 mmol/L (ref 3.3–5.3)
BKR SODIUM: 140 mmol/L (ref 136–144)

## 2022-06-15 LAB — CBC WITH AUTO DIFFERENTIAL
BKR WAM ABSOLUTE NRBC (2 DEC): 0 x 1000/ÂµL (ref 0.00–1.00)
BKR WAM HEMATOCRIT (2 DEC): 35.8 % (ref 35.00–45.00)
BKR WAM HEMOGLOBIN: 11.4 g/dL — ABNORMAL LOW (ref 11.7–15.5)
BKR WAM MCH (PG): 28 pg (ref 27.0–33.0)
BKR WAM MCHC: 31.8 g/dL (ref 31.0–36.0)
BKR WAM MCV: 88 fL (ref 80.0–100.0)
BKR WAM MPV: 10.1 fL (ref 8.0–12.0)
BKR WAM NUCLEATED RED BLOOD CELLS: 0 % (ref 0.0–1.0)
BKR WAM PLATELETS: 320 x1000/ÂµL (ref 150–420)
BKR WAM RDW-CV: 16.7 % — ABNORMAL HIGH (ref 11.0–15.0)
BKR WAM RED BLOOD CELL COUNT.: 4.07 M/ÂµL (ref 4.00–6.00)
BKR WAM WHITE BLOOD CELL COUNT: 5.2 x1000/ÂµL (ref 4.0–11.0)

## 2022-06-15 LAB — PHOSPHORUS     (BH GH L LMW YH): BKR PHOSPHORUS: 3.1 mg/dL (ref 2.2–4.5)

## 2022-06-15 LAB — PTH, INTACT     (BH GH LMW YH): BKR PARATHYROID HORMONE INTACT: 29.7 pg/mL (ref 15.0–65.0)

## 2022-06-15 NOTE — Plan of Care
Plan of Care Overview/ Patient Status    0700-1900Patient is A&Ox2-3, disoriented to time and situation. Complains of left leg pain, PRN tylenol and tramadol administered per order. Takes pills whole. PEG in place, TF held. Ileostomy bag changed this shift. Incontinent, purewick in place. AX2 turn and reposition Q2H, specialty bed in place, wore podus boot on left leg today. Safety checks and hourly rounding performed, see flow sheet and MAR for details.Problem: Adult Inpatient Plan of CareGoal: Plan of Care ReviewOutcome: Interventions implemented as appropriateGoal: Patient-Specific Goal (Individualized)Outcome: Interventions implemented as appropriateGoal: Absence of Hospital-Acquired Illness or InjuryOutcome: Interventions implemented as appropriateGoal: Optimal Comfort and WellbeingOutcome: Interventions implemented as appropriateGoal: Readiness for Transition of CareOutcome: Interventions implemented as appropriate Problem: InfectionGoal: Absence of Infection Signs and SymptomsOutcome: Interventions implemented as appropriate Problem: Occupational Therapy GoalsGoal: Occupational Therapy GoalsDescription: OT goals1. Pt will move to EOB for seated ADL mod I2. Pt will follow 3/4 simple one step motor commands IND3. Pt will sequence through set up ADL task INDOT re-eval, Initial goals not met, continue to progress towards initial goalsOutcome: Interventions implemented as appropriate Problem: Speech Language Pathology GoalsGoal: Dysphagia Goals, SLPDescription: Current goal: Patient will tolerate regular solids and thin liquids without adverse impact on respiratory status and while maintaining adequate oral nutrition.Prior goal (met): The patient will tolerate least restrictive diet without any decline in respiratory status.Prior goal (met): Pt will tolerate pureed diet with thin liquids w/o s/x of aspiration or adverse impact on respiratory status.Outcome: Interventions implemented as appropriate Problem: Impaired Wound HealingGoal: Optimal Wound HealingOutcome: Interventions implemented as appropriate Problem: Fall Injury RiskGoal: Absence of Fall and Fall-Related InjuryOutcome: Interventions implemented as appropriate Problem: Skin Injury Risk IncreasedGoal: Skin Health and IntegrityOutcome: Interventions implemented as appropriate Problem: Mobility ImpairmentGoal: Optimal MobilityOutcome: Interventions implemented as appropriate Problem: Aspiration (Enteral Nutrition)Goal: Absence of Aspiration Signs and SymptomsOutcome: Interventions implemented as appropriate Problem: Device-Related Complication Risk (Enteral Nutrition)Goal: Safe, Effective Therapy DeliveryOutcome: Interventions implemented as appropriate Problem: Pain AcuteGoal: Acceptable Pain Control and Functional AbilityOutcome: Interventions implemented as appropriate Problem: Self-Care DeficitGoal: Improved Ability to Complete Activities of Daily LivingOutcome: Interventions implemented as appropriate Problem: Fluid, Electrolyte and Nutrition Imbalance (Ileostomy)Goal: Fluid, Electrolyte and Nutrition BalanceOutcome: Interventions implemented as appropriate Problem: Infection (Ileostomy)Goal: Absence of Infection Signs and SymptomsOutcome: Interventions implemented as appropriate Problem: Confusion ChronicGoal: Optimal Cognitive FunctionOutcome: Interventions implemented as appropriate

## 2022-06-15 NOTE — Progress Notes
[facility]		    [facility]													Internal Medicine Progress NoteInpatient Attending: Daryll Drown, MDHospital Day: 124Interval Events SubjectiveNo acute events overnight. Appetite has improved and patient continuing to eat as best she can. Noted relief with lidocaine cream on right heel. Meds: Reviewed in chart Objective: Vitals:Temp:  [97.4 ?F (36.3 ?C)-98.6 ?F (37 ?C)] 98.6 ?F (37 ?C)Pulse:  [59-66] 66Resp:  [20] 20BP: (106-125)/(68-79) 112/69SpO2:  [96 %-99 %] 99 %Device (Oxygen Therapy): room air I/O's:Intake/Output Summary (Last 24 hours) at 06/15/2022 1153Last data filed at 06/14/2022 2214Gross per 24 hour Intake -- Output 800 ml Net -800 ml  Physical Exam:Gen: Well appearing, no acute distressCV: Regular rate and rhythm, no murmurs, rubs, or gallopsPulm: Breathing comfortably on room air. Abd: soft, nontender, ostomy in place, PEG tube in place without discharge Ext: Warm, well-perfused, contracted left foot/ankle with limited ROM in lower extremitiesNeuro: Responds to questions appropriately, no focal deficits appreciated. Labs: Reviewed in chart. Of note hypercalcemia of 10.5 on 6/25 with does not appear symptomatic. 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 In Dec 2022, she was found to have abdominal abscess and had G tube placed for nutrition after Ethics was involved, and a STR facility was found in [place]. She was brought back to [facility] Feb 11 2022 due to ongoing encephalopathy and failure to thrive by sister, and the request was made for long term care by family. ?She has had extensive work up for encephalopathy, including MRI brain, L spine, paraneoplastic work up. No organic etiology identified. The decision has been made to await title 19 and long term placement with family. She has been approved for medicaid and in process of finding long term facility. 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 Slightly decreased from last week to 10.5 6/25. Will continue to monitor. 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 Concern she will likely remain bed bound given prolonged course and the fact she is only getting PT 2x per week while inpatient awaiting placement. - Provide topical lidocaine PRN for affected area?# Iron Deficiency- Continue ferrous gluconate 324 mg PO QOD with breakfast and dinner?# Chronic polyneuropathy- Continue gabapentin 100 mg TIDHome meds:- Continue niacin, B6, thiamine Signed:Lyndal Alamillo, MD PGY-1Available on MHBJune 25, 20231:17 PM

## 2022-06-15 NOTE — Plan of Care
Plan of Care Overview/ Patient Status    1900-0700: A&Ox3, disoriented to time. VSS on RA. Complaint of BLE pain, prn medication refused, positioned. Regular diet. Incontinent of urine. Purewick in place, changed this shift. Colostomy in place, draining. Ax1 in bed. Pills whole. No acute events this shift. Safety maintained. Call bell in reach. Bed alarm on. Hourly rounding completed.

## 2022-06-16 MED ORDER — DICLOFENAC 1 % TOPICAL GEL
1 % | Freq: Four times a day (QID) | TOPICAL | Status: DC
Start: 2022-06-16 — End: 2022-08-15
  Administered 2022-06-17 – 2022-08-14 (×157): 1 % via TOPICAL

## 2022-06-16 NOTE — Plan of Care
Plan of Care Overview/ Patient Status    1900-0700: A&Ox3, disoriented to time. VSS on RA. Complaint of BLE pain, prn medication refused, positioned. Regular diet. Incontinent of urine. Purewick in place, changed this shift. Colostomy in place, draining. Ax1 in bed. Pills whole. No acute events this shift. Safety maintained. Call bell in reach. Bed alarm on. Hourly rounding completed.

## 2022-06-16 NOTE — Other
-  CONSULT  REQUEST  DOCUMENTATION-[facility] NOTE-Type of consult: [facility] Interventional Radiology -New Consult: [name] [name] /Location: 9719/9719-B / Brief Clinical Question: 66 yo Female with G tube, good PO intake, consult to remove G tube/Callback Cell Phone: [phone] / Please confirm receipt of this message by texting back ?OK?-2 Glena [place] Page sent to Interventional Radiology [facility] [phone] at 11:59 AM.-Adella Manolis Dick6/26/202311:59 [facility] [phone]

## 2022-06-16 NOTE — Plan of Care
Danielle [name]					Location: EP 97/9719-B66 y.o., female				Attending: Daryll Drown, MD	Admit Date: 02/11/2022			[name] LOS: 125 days FOLLOW-UP NUTRITION ASSESSMENT Dietary Orders (From admission, onward)     Start     Ordered  06/14/22 1113  Diet Tube Feed With Tray  (Diet Tube Feed Panel)  DIET EFFECTIVE NOW      Comments: HOLD TUBE FEEDS for 6/24-6/26- conduct calories counts in flow sheets- please record all meals and note if meal is skipped. RD to follow up 6/26 with results. Question Answer Comment Diet Selection? Regular  Safety Tray? (Suicide/Homicide/Police Custody) Safety Tray - No  Feeding Route: G-Tube  Initiate Nutrition Management Protocol (Yes/No?) Yes - Initiate Protocol    06/14/22 1113  06/12/22 1344  Nutrition Supplements  (Nutrition Supplement Panel)  Once      Question Answer Comment Adult Supplements: Ensure Plus Vanilla [facility])  Supplement Frequency: TID    06/12/22 1343    No Known AllergiesANTHROPOMETRICS:Height:?70Admit wt:?79.4 kg (per SNF documentation.)Current wt:?74kg stable from 73.9?kg BMI:?23.41Dosing wt: ?74.8 kg (normal BMI)?Additional wt information:Ht confirmed with pt:?yes1-2+?edema noted per flowsheets. Wt stable since last nutrition assessment.?Will ctm wts. Epic wt hx:? ? Wt: 05/21/22 74.8 kg 12/13/18 98.6 kg ??ESTIMATED NUTRITION REQUIREMENTS:Kcal/day:?1870????????????????????(25 ?kcal/kg)Protein/day:?75-90??????????????(1-1.2gm/kg)Fluids/day:?1870?mL/d?(56mL/kcal) ONLY as medical and fluid status allows and only per team discretion. Needs based on:?Dosing Wt (74.8?kg), maintenance??NUTRITION ASSESSMENT:?PT EMR reviewed for follow up assessment and calorie count review. Meds and labs reviewed. Since last nutrition assessment pt has continued to tolerate po intake, TF have been held since 6/22.?Met with pt at bedside.?Pt endorses a good appetite and reports she is having 2-3 meals/day. Pt dislikes ensure, wants them discontinued. She was not amenable to any other nutrition supplements. She is willing to trial greek yogurt, assisted pt with ordering lunch tray and added yogurt. Pt ate a bagel, cream cheese and whole milk for breakfast, encouraged her to drink milk with every meal.Calorie count:6/23 (Breakfast and Dinner): 920kcal, 35gm protein6/24: (Breakfast and Lunch): 978kcal, 34gm protein6/25: (B, L, D): 1273kcal, 52gm protein On average pt consuming 1057kcal (56% of estimated kcal needs) and 40gm protein (54% of estimated protein needs). When all 2 meals were documented 6/25 pt ate 70% of estimated needs. Noted plan for possible PEG removal. Goal is >60% Of estimated needs to stop nutrition support. Pt was pleasant but frustrated during visit, she states that she is eating about normal for her and is upset that people are trying to tell her what to eat. She states she is eating about normal for her, she does note she is having some trouble because hospital kitchen has been out of foods she likes. Pt may benefit from continuing 1-2 bottles of Jev 1.5 daily to optimize nutrition intake. Messaged covering provider, Virgina Organ, with results from calorie count and dicussed concerns about pulling PEG at this time. We agreed upon continuing calorie count x 2 more days, continue to hold TF. Provider to discuss accurate documentation with nursing. ?Cultural/Religious/Ethnic Needs:?None?Food Allergies: NKFA?NUTRITION DIAGNOSIS:Severe malnutrition r/t depletion aeb moderate muscle and fat depletion.- continues?- if wt remains stable and nutrition at supportive level would consider resolving malnutrition diagnosis.??INTERVENTIONS/RECOMMENDATIONS:?Meals and Snacks:-Continue with Regular trays-Encourage protein rich food sources with each tray ordered to better meet protein needs- Please document ALL PO intake and if a pt skips a meal, nutrition will follow to assess calorie count 6/28. Goal:?po intake?for comfort given support provided by?goal?volume bolus feeds,?- d/cNew goal: Pt will meet >60% of estimated needs to d/c enteral nutrition. ?Enteral Nutrition:?	If TF remains within POC, consider?JEVITY 1.5?@?1 carton?BID (9am,?5pm)?-	Provides:?437mL, 710 kcal,?30gm protein,?  free waterGoal: ?Patient will receive >90% of goal volume TF/ TPN prior to next nutrition assessment- not met, TF held since 6/22?Discharge Planning and Transfer of Nutrition Care:??Nutrition related discharge needs still being determined at this time, will continue to follow ??MONITORING / EVALUATION:?Food/Nutrition-Related History:Food and Nutrient IntakeEnteral and Parenteral Nutrition Intake?Anthropometric Measurements:Weight ?Biochemical Data, Medical Tests and Procedures:Electrolyte and renal profileGlucose/endocrine profile?Nutrition-Focus Physical FindingsOverall findings?Electronically signed by Jetty Peeks, MS, RD, CNSC 6/26/2023MHB: [phone] for Sherrilee Gilles, RD MHB- [phone] Please note: Nutrition is a consult only service on weekends and holidays.  Please enter a consult in EPIC if assistance is needed on a weekend or holiday or via MHB [phone] to contact the covering RD.

## 2022-06-16 NOTE — Plan of Care
Plan of Care Overview/ Patient Status    0700 - 1500Patient alert and oriented x 3. Vital signs stable, room air. Assist X 2 in bed. Regular diet, good PO intake. Lung sounds clear, bowel sounds present. Ileostomy to RLQ  with stool and flatus, Ileostomy emptied PRN; see flowsheet. Repositioned Q2H, report given to oncoming nurse. ?Problem: Adult Inpatient Plan of CareGoal: Plan of Care Review6/26/2023 1243 by Yetta Barre, RNFlowsheetsTaken 06/16/2022 1243 by Yetta Barre, RNProgress: no changeTaken 06/12/2022 2245 by Lynne Logan, RNPlan of Care Reviewed With: patient6/26/2023 1243 by Yetta Barre, RNOutcome: Interventions implemented as appropriate Problem: Adult Inpatient Plan of CareGoal: Patient-Specific Goal (Individualized)06/16/2022 1243 by Yetta Barre, RNFlowsheets (Taken 06/16/2022 0829)What Anxieties, Fears, Concerns or Questions Do You Have About Your Care?: None verbalized.Individualized Preferences and Care Needs: Per POC.Patient Centered Long Term Goal: Safe D/C.06/16/2022 1243 by Yetta Barre, RNOutcome: Interventions implemented as appropriate

## 2022-06-16 NOTE — Plan of Care
Inpatient Physical Therapy Progress Note IP Adult PT Eval/Treat - 06/16/22 0927    Date of Visit / Treatment  Date of Visit / Treatment 06/16/22   Progress Report Due 07/02/22   End Time 0927    General Information  Subjective I need a warm up first.   General Observations Supine in bed. Cleared by RN.   Precautions/Limitations Fall Precautions;Bed alarm;Chair alarm    Weight Bearing Status  Weight Bearing Status Comments WBAT BLE    Vital Signs and Orthostatic Vital Signs  Vital Signs Vital Signs Stable   Vital Signs Free text RA; NAD    Pain/Comfort  Pain Comment (Pre/Post Treatment Pain) LLE 6/10 pain with mobility, RN aware.    Patient Coping  Observed Emotional State cooperative   Diversional Activities television    Cognition  Orientation Level Oriented to person;Oriented to place;Oriented to time   Level of Consciousness alert   Following Commands Follows one step commands with repetition   Personal Safety / Judgment Fall risk    Manual Muscle Testing  Manual Muscle Testing Comments +Deconditioned    Skin Assessment  Skin Assessment See Nursing Documentation    Balance  Sitting Balance: Static  POOR+    Minimal assist to maintain static position with no Assistive Device   Sitting Balance: Dynamic  POOR     Moves through 1/4 to 1/2 ROM range with moderate assist to right self    Bed Mobility  Supine-to-Sit Independence/Assistance Level Maximum assist   Supine-to-Sit Assist Device Hand held assist;Head of bed elevated;Draw pad   Sit-to-Supine Independence/Assistance Level Maximum assist   Sit-to-Supine Assist Device Hand held assist   Limitations decreased ability to use legs for bridging/pushing   Bed Mobility Comments R side lean noted. Cues and assist to correct. Ax1 to maintain sitting balance.    Handoff Documentation  Handoff Patient in bed;Bed alarm;Patient instructed to call nursing for mobility;Discussed with nursing    Endurance  Endurance Comments Poor+    PT- AM-PAC - Basic Mobility Screen- How much help from another person do you currently need.....  Turning from your back to your side while in a a flat bed without using rails? 1 - Total - Requires total assistance or cannot do it at all.   Moving from lying on your back to sitting on the side of a flat bed without using bed rails? 1 - Total - Requires total assistance or cannot do it at all.   Moving to and from a bed to a chair (including a wheelchair)? 1 - Total - Requires total assistance or cannot do it at all.   Standing up from a chair using your arms(e.g., wheelchair or bedside chair)? 1 - Total - Requires total assistance or cannot do it at all.   To walk in a hospital room? 1 - Total - Requires total assistance or cannot do it at all.   Climbing 3-5 steps with a railing? 1 - Total - Requires total assistance or cannot do it at all.   AMPAC Mobility Score 6   TARGET Highest Level of Mobility Mobility Level 2, Turn self in bed/bed activity/dependent transfer    ACTUAL Highest Level of Mobility Mobility Level 3, Sit on edge of bed    Therapeutic Exercise  Therapeutic Exercise Comments AAROM/AROM as tolerated    Neuromuscular Re-education  Neuromuscular Re-education Comments static/dynamic sitting balance.    Therapeutic Functional Activity  Therapeutic Functional Activity Comments Bed mobility    Clinical Impression  Follow up  Assessment Pt seen for PT follow up session. Tolerated therex, bed mobility and sitting balance activities. Ax1 with all mobility. Deficits noted in strength, balance and activity tolerance. Will benefit from continued skilled PT to improve funcitonal mobility.    Patient/Family Stated Goals  Patient/Family Stated Goal(s) return to prior level of functioning    Frequency/Equipment Recommendations  PT Frequency 2x per week   What day of week is next treatment expected? Thursday   6/29  PT/PTA completing this assessment Patsey Berthold    PT Recommendations for Inpatient Admission  Activity/Level of Assist dangle edge of bed;assist of 2   Therapeutic Exercise ROM as tolerated;encourage exercise program issued    Planned Treatment / Interventions  Plan for Next Visit Progress as tolerated   Education Treatment / Interventions Patient Education / Training    PT Discharge Summary  Physical Therapy Disposition Recommendation SNF for long term placement     Lemmie Evens, [place]: [phone]

## 2022-06-17 NOTE — Plan of Care
Plan of Care Overview/ Patient Status    [phone]:  Pt A&Ox3?d/o situation.?VSS on RA. Denies SOB, CP, N/V/D. Right sided ileostomy +flatus and stool.  Pt incontinent of urine, prn care provided,?Purewick in place and changed this shift.?PEG tube?in place, dressing changed this shift,  clean dry and intact. Good PO intake.?BLE elevated with pillows.?Hourly rounding. T&Rq2h. Pt resting in bed.. Safety precautions maintained, bed alarm on, call bell in reach, will ctm and follow nursing poc.Problem: Adult Inpatient Plan of CareGoal: Plan of Care ReviewOutcome: Interventions implemented as appropriateFlowsheets (Taken 06/17/2022 0431)Progress: no changePlan of Care Reviewed With: patient

## 2022-06-17 NOTE — Progress Notes
I saw and evaluated the patient. I agree with the findings and the plan of care as documented in the resident?s note.  Pt seen with house staff and reviewed, hx taken and plan discussed.Today lucky enough to have brother present to emotionally support.  He is up from [place].  Ms. Judie Petit doing well and more flexibility, though with pain, of her ankles.  Clearly can move.She is worried about home situation including bills and would like very much to chat with a case worker.  We promised the phone number.For IR and all parties involved in Ms. M's care: there is absolutely no worry about aspiration or mechanics of swallowing.  This has not been part of her medical hx previously or on this admission.Let's repeat her albumin and pre-albumin with next numbers.PL Robb Matar

## 2022-06-17 NOTE — Consults
[place] Interventional RadiologyBrief Note Consult for Gastrostomy tube removalThis is a 66 year old female  With history of RA on methotrexate, diverticular perforation, distal sigmoid colon stricture sp ex lap right hemicolectomy and end ileostomy, bilateral DVTs, abdominal abscess sp gastrostomy tube placement for nutrition access from outside hospital IT was replaced by IR on 03/21/22 MIC 24 FR due to dislodgement.Patient had a calorie count done  (6/23,6/24 and 6/25) Kindly see Jetty Peeks RD note on 06/16/22. Patient is not consistently meeting  Her goal of > 60% recommending to continue the calorie count  X 2 more days which will be due on 06/18/22. Follow up with registered dietician  recommendations.Above discussed with requesting provider Dr Valentino Nose you for involving Interventional Radiology in the care of your patient. Please text or call my Mobile [facility] with any questions or concerns.With urgent questions or concerns, please contact IR ZH:YQMVHQION (pagers)- [facility]: [phone]- [facility]: [phone]- BH: [phone] (phone, all locations)- [phone] Candie Echevaria, APRN 06/17/2022

## 2022-06-17 NOTE — Plan of Care
Problem: Fall Injury RiskGoal: Absence of Fall and Fall-Related InjuryOutcome: Interventions implemented as appropriate Problem: Impaired Wound HealingGoal: Optimal Wound HealingOutcome: Interventions implemented as appropriate Problem: Mobility ImpairmentGoal: Optimal MobilityOutcome: Interventions implemented as appropriate Plan of Care Overview/ Patient Status    Assume care this amAlert,oriented x 3 , confuse about situation.RAAttached to pure wickWith right ileostomy With g tube not in used .Pills whole Waiting for title 19 for long term placement1430Ileostomy bag changed due to leaking .

## 2022-06-17 NOTE — Plan of Care
1500-1900: Received patient, AAO x 3. Denies pain and discomfort, VSS. Call bell within reach, bed alarm active.Problem: Adult Inpatient Plan of CareGoal: Plan of Care ReviewOutcome: Interventions implemented as appropriateFlowsheets (Taken 06/17/2022 1904)Progress: no changePlan of Care Reviewed With: patient Problem: Adult Inpatient Plan of CareGoal: Patient-Specific Goal (Individualized)Outcome: Interventions implemented as appropriateFlowsheets (Taken 06/17/2022 0400 by Ernestina Penna, [place], RN)What Anxieties, Fears, Concerns or Questions Do You Have About Your Care?: None statedIndividualized Preferences and Care Needs: per POCPatient Centered Long Term Goal: Safe dischargePatient Centered Daily Goal: Comfort Plan of Care Overview/ Patient Status

## 2022-06-17 NOTE — Other
-  CONSULT  REQUEST  DOCUMENTATION-[facility] NOTE-Type of consult: [facility] Interventional Radiology -New Consult: [name] [name] /Location: 9719/9719-B / Brief Clinical Question: Please evalaute the patient for removal of PEG tube; patietn has been having more than 60% food intake and there is not concern for aspriatoin/Callback Cell Phone: [phone] / Please confirm receipt of this message by texting back ?OK?-2 Glena [place] Page sent to Interventional Radiology [facility] [phone] at 12:47 PM.-Gregg Winchell Justice Deeds, PCT6/27/202312:47 [facility] [phone]

## 2022-06-17 NOTE — Plan of Care
Plan of Care Overview/ Patient Status    1900-0700: A&Ox3, disoriented to time. VSS on RA. Complaint of bilateral leg and heel pain, scheduled pain med given. Ileostomy in place, changed bag this shift. Purewick in place, changed this shift. Skin assessment documented in flowsheet. Regular diet. Calorie count in place. Strict I &O.  Ax2 in bed. Pills whole. Safety maintained. Call bell in reach. Bed alarm on. Hourly rounding completed.Problem: Adult Inpatient Plan of CareGoal: Plan of Care ReviewOutcome: Interventions implemented as appropriateGoal: Patient-Specific Goal (Individualized)Outcome: Interventions implemented as appropriateGoal: Absence of Hospital-Acquired Illness or InjuryOutcome: Interventions implemented as appropriateGoal: Optimal Comfort and WellbeingOutcome: Interventions implemented as appropriateGoal: Readiness for Transition of CareOutcome: Interventions implemented as appropriate Problem: InfectionGoal: Absence of Infection Signs and SymptomsOutcome: Interventions implemented as appropriate Problem: Physical Therapy GoalsGoal: Physical Therapy GoalsDescription: PT GOALS - addressed 05/02/22.1. Patient will demonstrate active range of motion x 4 extremities in preparation for mobility (GOAL MET)2. Caregivers will be independent and safely implement a range of motion/positioning program to prevent loss of range of motion and skin integrity (ONGOING)3. Patient will perform bed mobility with maximal assist (GOAL MET)4. Patient will tolerate sitting edge of bed >5 minutes with minimal assist (GOAL MET)5. Patient will perform transfers with moderate assist of 2 using rolling walker (ONGOING)6. Pt will perform bed mobility w/ modAx1 (ONGOING)7. Pt will perform STS transfer w/ modAx2 for a standing tolerance of 1 minute. (NEW) Outcome: Interventions implemented as appropriate Problem: Occupational Therapy GoalsGoal: Occupational Therapy GoalsDescription: OT goals1. Pt will move to EOB for seated ADL mod I2. Pt will follow 3/4 simple one step motor commands IND3. Pt will sequence through set up ADL task INDOT re-eval, Initial goals not met, continue to progress towards initial goalsOutcome: Interventions implemented as appropriate Problem: Impaired Wound HealingGoal: Optimal Wound HealingOutcome: Interventions implemented as appropriate Problem: Fall Injury RiskGoal: Absence of Fall and Fall-Related InjuryOutcome: Interventions implemented as appropriate Problem: Skin Injury Risk IncreasedGoal: Skin Health and IntegrityOutcome: Interventions implemented as appropriate Problem: Mobility ImpairmentGoal: Optimal MobilityOutcome: Interventions implemented as appropriate Problem: Aspiration (Enteral Nutrition)Goal: Absence of Aspiration Signs and SymptomsOutcome: Interventions implemented as appropriate Problem: Device-Related Complication Risk (Enteral Nutrition)Goal: Safe, Effective Therapy DeliveryOutcome: Interventions implemented as appropriate Problem: Pain AcuteGoal: Acceptable Pain Control and Functional AbilityOutcome: Interventions implemented as appropriate Problem: Speech Language Pathology GoalsGoal: Dysphagia Goals, SLPDescription: Current goal: Patient will tolerate regular solids and thin liquids without adverse impact on respiratory status and while maintaining adequate oral nutrition.Prior goal (met): The patient will tolerate least restrictive diet without any decline in respiratory status.Prior goal (met): Pt will tolerate pureed diet with thin liquids w/o s/x of aspiration or adverse impact on respiratory status.Outcome: Interventions implemented as appropriate Problem: Self-Care DeficitGoal: Improved Ability to Complete Activities of Daily LivingOutcome: Interventions implemented as appropriate Problem: Fluid, Electrolyte and Nutrition Imbalance (Ileostomy)Goal: Fluid, Electrolyte and Nutrition BalanceOutcome: Interventions implemented as appropriate Problem: Infection (Ileostomy)Goal: Absence of Infection Signs and SymptomsOutcome: Interventions implemented as appropriate Problem: Confusion ChronicGoal: Optimal Cognitive FunctionOutcome: Interventions implemented as appropriate

## 2022-06-17 NOTE — Progress Notes
[facility]		    [facility]													Internal Medicine Progress NoteInpatient Attending: Daryll Drown, MDHospital Day: 125Interval Events SubjectiveNo acute events overnight. Continuing to do well with meals. Pain has been better in the lower extremities. Meds: Reviewed in chart Objective: Vitals:Temp:  [97.6 ?F (36.4 ?C)-98.6 ?F (37 ?C)] 98.6 ?F (37 ?C)Pulse:  [67-97] 97Resp:  [16-20] 16BP: (110-123)/(62-72) 123/72SpO2:  [96 %-99 %] 96 %Device (Oxygen Therapy): room air I/O's:Intake/Output Summary (Last 24 hours) at 06/16/2022 1751Last data filed at 06/16/2022 1608Gross per 24 hour Intake -- Output 850 ml Net -850 ml  Physical Exam:Gen: Well appearing, no acute distressCV: Regular rate and rhythm, no murmurs, rubs, or gallopsPulm: Breathing comfortably on room air. Abd: soft, nontender, ostomy in place, PEG tube in place without discharge Ext: Warm, well-perfused, contracted left foot/ankle with limited ROM in lower extremitiesNeuro: Responds to questions appropriately, no focal deficits appreciated. Labs: Reviewed in chart. Of note hypercalcemia of 10.5 on 6/25 with does not appear symptomatic. 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 In Dec 2022, she was found to have abdominal abscess and had G tube placed for nutrition after Ethics was involved, and a STR facility was found in [place]. She was brought back to [facility] Feb 11 2022 due to ongoing encephalopathy and failure to thrive by sister, and the request was made for long term care by family. ?She has had extensive work up for encephalopathy, including MRI brain, L spine, paraneoplastic work up. No organic etiology identified. The decision has been made to await title 19 and long term placement with family. She has been approved for medicaid and in process of finding long term facility. 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 Slightly decreased from last week to 10.5 6/25. Will continue to monitor. 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 Concern she will likely remain bed bound given prolonged course and the fact she is only getting PT 2x per week while inpatient awaiting placement. - Topical diclofenac gel - Provide topical lidocaine PRN for affected area?# Iron Deficiency- Continue ferrous gluconate 324 mg PO QOD with breakfast and dinner?# Chronic polyneuropathy- Continue gabapentin 100 mg TIDHome meds:- Continue niacin, B6, thiamine Signed:Lashanna Angelo, MD PGY-1Available on MHBJune 26, 20231:17 PM

## 2022-06-18 NOTE — Progress Notes
[facility]		    [facility]													Internal Medicine Progress NoteInpatient Attending: Daryll Drown, MDHospital Day: 127Interval Events SubjectiveNo acute events overnight. Continuing to do well with meals. Pain has been intermittent in the lower extremities. Was able to meet with SW today. Meds: Reviewed in chart Objective: Vitals:Temp:  [97.6 ?F (36.4 ?C)-97.9 ?F (36.6 ?C)] 97.8 ?F (36.6 ?C)Pulse:  [70-83] 83Resp:  [18-20] 20BP: (101-120)/(64-73) 120/68SpO2:  [97 %-98 %] 98 %Device (Oxygen Therapy): room air I/O's:Intake/Output Summary (Last 24 hours) at 06/18/2022 1802Last data filed at 06/18/2022 1700Gross per 24 hour Intake 120 ml Output -- Net 120 ml  Physical Exam:Gen: Well appearing, no acute distressPulm: Breathing comfortably on room air. Abd: soft, nontender, ostomy in place, PEG tube in place without discharge Ext: Warm, well-perfused, contracted left foot/ankle with limited ROM in lower extremitiesNeuro: Responds to questions appropriately, no focal deficits appreciated. Labs: Reviewed in chart. 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 In Dec 2022, she was found to have abdominal abscess and had G tube placed for nutrition after Ethics was involved, and a STR facility was found in [place]. She was brought back to [facility] Feb 11 2022 due to ongoing encephalopathy and failure to thrive by sister, and the request was made for long term care by family. ?She has had extensive work up for encephalopathy, including MRI brain, L spine, paraneoplastic work up. No organic etiology identified. The decision has been made to await title 19 and long term placement with family. She has been approved for medicaid and in process of finding long term facility. #Nutrition w/ G tube in place- Appreciate dietician input- Encourage PO intake with calorie counts- Continue to hold tube feeds with plan to remove PEG tube this week- Following IR- No need for SLP. 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 Slightly decreased from last week to 10.5 6/25. Will continue to monitor. 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 Concern she will likely remain bed bound given prolonged course and the fact she is only getting PT 2x per week while inpatient awaiting placement. - Topical diclofenac gel - Provide topical lidocaine PRN for affected area- Encouraged more frequent PT, encourage to wear prevalon boots in between sessions?# Iron Deficiency- Continue ferrous gluconate 324 mg PO QOD with breakfast and dinner?# Chronic polyneuropathy- Continue gabapentin 100 mg TIDHome meds:- Continue niacin, B6, thiamine Signed:Falan Hensler, MD PGY-1Available on MHBJune 28, 20231:17 PM

## 2022-06-18 NOTE — Plan of Care
Plan of Care Overview/ Patient Status    Pt with good PO intake today, c/o leg and heel pain given PRN medication, resting quietly in bed

## 2022-06-18 NOTE — Plan of Care
NUTRITION NOTE:PT EMR reviewed for calorie count results. Pt continues to do well with po intake and appears to be increasing intake slowly. Pt is currently provided with a wide variety of food options to choose from, has been provided increased support and effort by nursing staff to encourage improve po intake. Pt starting to verbalize menu fatigue and frustration with limited options. Pt has also started to become more vocal about feeling forced to eat more and has had some issues with the kitchen not having food she had enjoyed(food service provider is in transition and changing patient menu options). This may continue to be an issue now and most certainly at a facility where there is less variety and options. Pt does express the desire to have the PEG removed and if deemed able to make that decision then she can certainly be supported by continued increased effort and support to ensure she is eating sufficiently over the long term.  Review of kcal counts from this past week:6/23 (B, D): 920kcal, 35gm protein6/24: (B, L): 978kcal, 34gm protein6/25: (B, L, D): 1273kcal, 52gm protein 6/26: (B, L, D): 1367 kcal, 65g protein6/27: (B, L, D): 1251 kcal, 37g proteinOn average pt consuming 1157kcal (61 % of estimated kcal needs) and 44.6gm protein (59% of estimated protein needs). Pt does not like the Ensure Plus but will trial Ensure Clear today and if she likes it can consume one at each meal. This is a short term option as facility is not likely to have Ensure Clear for patient. If pt is able to take an oral supplement on a reliable basis she would more comfortablyINTERVENTIONS/RECOMMENDATIONS:?Meals and Snacks:-Continue with Regular trays- Can continue to hold tube feeds for an additional two days with addition of an new oral supplement that pt is willing to trial.   -Encourage protein rich food sources with each tray ordered to better meet protein needs- Please document ALL PO intake and if a pt skips a meal, nutrition will follow to assess calorie count 6/30. ?Oral supplement:- will trial Ensure Clear TID?Enteral Nutrition: ?	If TF remains within POC, consider?JEVITY 1.5?@?1 carton?BID (9am,?5pm)?-	Provides:?476mL, 710 kcal,?30gm protein,? free waterGoal: ?Patient will receive >90% of goal volume TF/ TPN prior to next nutrition assessment- not met, TF held since 6/22?

## 2022-06-18 NOTE — Progress Notes
[facility]		    [facility]													Internal Medicine Progress NoteInpatient Attending: Daryll Drown, MDHospital Day: 126Interval Events Overnight: no acute eventsSubjectiveWants to talk to SW. Asked SW to meet herLower ext pain, provided strechingContinuing to do well with meals. Meds: Reviewed in chart Objective: Vitals:Temp:  [97.4 ?F (36.3 ?C)-98.5 ?F (36.9 ?C)] 97.4 ?F (36.3 ?C)Pulse:  [57-71] 71Resp:  [16-20] 18BP: (101-131)/(62-77) 109/71SpO2:  [97 %-100 %] 98 %Device (Oxygen Therapy): room air I/O's:Intake/Output Summary (Last 24 hours) at 06/17/2022 1823Last data filed at 06/17/2022 1437Gross per 24 hour Intake -- Output 1450 ml Net -1450 ml  Physical Exam:Gen: Conversant in NADHEENT: MMM, no JVDCV: RRR, S1 S2, no murmursPulm: CTABAbd: Soft, BS+, ND/NT, PEG tube in place without dischargeExt: 2+ symmetric distal pulses, no LE edema, contracted ankle with limited ROM b/l LE L>RNeuro: AAOx3, non-focalLabs: No new labsAssessment and Plan 66 yo F with RA, diverticular perforation, 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 In Dec 2022, she was found to have abdominal abscess and had G tube placed for nutrition after Ethics was involved, and a STR facility was found in [place]. She was brought back to [facility] Feb 11 2022 due to ongoing encephalopathy and failure to thrive by sister, and the request was made for long term care by family.  She has had extensive work up for encephalopathy, including MRI brain, L spine, paraneoplastic work up. No organic etiology identified. The decision has been made to await title 19 and long term placement with family. She has been approved for medicaid and in process of finding long term facility. 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 Slightly decreased from last week to 10.5 6/25. Will continue to monitor. 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 Concern she will likely remain bed bound given prolonged course and the fact she is only getting PT 2x per week while inpatient awaiting placement. - Topical diclofenac gel - Provide topical lidocaine PRN for affected area- F/u PT # Iron Deficiency- Iron supplementation # Chronic polyneuropathy- Continue gabapentin 100 mg TIDHome meds:- Continue niacin, B6, thiamine Signed:Saeed Soleymanjahi, MD [facility] Internal Medicine PGY-3Reachable by MHB

## 2022-06-18 NOTE — Plan of Care
Plan of Care Overview/ Patient Status    Medical Social Work Follow Up  [employer] Most Recent Value Admission Information  Document Type Progress Note Reason for Inability to Assess --  [Patient mental status is altered] (For Inpatient/ED Only) Prior psychosocial assessment has been documented within this hospitalization No (For Inpatient/ED Only) Prior psychosocial assessment has been documented within 30 days of this hospitalization No Reason for Current Social Work Involvement Sport and exercise psychologist, Patient Family/Caregiver Comment Public librarian Medical Team Comment Transition Coordination, Medical Team Community Agency Comment [place] [address] Record Reviewed Yes Level of Care Inpatient Psychosocial issues requiring intervention Patient request Psychosocial interventions 25 minutes spent face to face with Arrin in collaboration with Transition Coordination. Tyiesha presented as pleasant and cooperative. Transition Coordinator and I provided support as she shared and processed her feelings. [place] reported that she feels that she is in a much better mental state and her hopes are to regain her independence. Supportive Counseling was provided. Title 19, long term care, and possibilties for return to the community in the future were discussed. Education about [employer] and [employer] was provided to increase her knowledge and understanding of the services that are available to her. Ralene conveyed hope for her future and the motivation the regain her ability to walk. I invited Verl to reach out to me at any tme for support as needed. Administrator, Medical Team Specific referrals to enhance community supports (include existing and new resources) None Handoff Required? No Next Steps/Plan (including hand-off): I will continue to remain involved for support as needed. Signature: Leim Fabry, LCSW Contact Information: [phone]

## 2022-06-19 NOTE — Procedures
Gastrostomy tube removed at bedside without issue.

## 2022-06-19 NOTE — Plan of Care
Inpatient Physical Therapy Progress Note IP Adult PT Eval/Treat - 06/19/22 0928    Date of Visit / Treatment  Date of Visit / Treatment 06/19/22   Note Type Progress Note   Progress Report Due 07/02/22   End Time 0928    General Information  Subjective That was a good seven.   Other Providers OT d/t case complexity and incr need for skilled assist   General Observations Pt received supine in bed; on RA; NAD; ileostomy; PEG; purwick; RN cleared   Precautions/Limitations Fall Precautions;Bed alarm    Vital Signs and Orthostatic Vital Signs  Vital Signs Vital Signs Stable   Vital Signs Free text RA; NAD    Pain/Comfort  Pain Comment (Pre/Post Treatment Pain) Pt c/o 7/10 LLE pain w/ mobilization; RN aware and repositioned for comfort    Patient Coping  Observed Emotional State accepting;cooperative;tearful/crying   Verbalized Emotional State acceptance    Cognition  Overall Cognitive Status WFL   Orientation Level Oriented X4   Level of Consciousness alert   Following Commands Follows one step commands with repetition   Personal Safety / Judgment Fall risk;Requires supervision with mobility   Cognition Comments Pleasant; requires reassurance and motivation at times    Range of Motion  Range of Motion Comments Beginning to develop B/L ankle PF and knee flexion contractures; Limited RUE elevation    Manual Muscle Testing  Manual Muscle Testing Comments + Deconditioned    Skin Assessment  Skin Assessment See Nursing Documentation    Balance  Sitting Balance: Static  FAIR-      Contact Guard to maintain static position with no Assistive Device   Sitting Balance: Dynamic  POOR+   Moves through 1/2 range with minimal assist to right self   Standing Balance: Static POOR-     Maximal assist to maintain static position with no Assistive Device   Standing Balance: Dynamic  POOR-    Unable to move from midline voluntarily, maximal assist to right self   Balance Assist Device Hand held assist   Balance Skills Training Comment Ax2    Bed Mobility  Supine-to-Sit Independence/Assistance Level Maximum assist;Assist of 2   Supine-to-Sit Assist Device Head of bed elevated;Hand held assist   Sit-to-Supine Independence/Assistance Level Maximum assist;Assist of 2   Sit-to-Supine Assist Device Hand held assist;Head of bed elevated   Limitations decreased ability to use legs for bridging/pushing   Bed Mobility, Impairments ROM decreased;pain;strength decreased;postural control impaired   Bed Mobility Comments Noted R lateral lean seated EOB requiring cues to correct; performed BLE therex EOB; Pt edu on supine HEP consisiting of isometric ankle pumps into foam wedge    Sit-Stand Transfer Training  Sit-to-Stand Transfer Independence/Assistance Level Maximum assist;Assist of 2   Sit-to-Stand Transfer Assist Device Hand held assist   Stand-to-Sit Transfer Independence/Assistance Level Maximum assist;Assist of 2   Stand-to-Sit Transfer Assist Device Hand held assist   Transfer Safety Analysis Impairments pain;decreased ROM;decreased strength   Sit-Stand Transfer Comments Resistive to STS w/ noted retropulsion; unable to achieve full upright despite verbal and tactile cues    Gait Training  Gait Training Comments NT d/t unsteadiness, pain, and weakness    Handoff Documentation  Handoff Patient in bed;Bed alarm;Patient instructed to call nursing for mobility;Discussed with nursing    Endurance  Endurance Comments Fair-    PT- AM-PAC - Basic Mobility Screen- How much help from another person do you currently need.....  Turning from your back to your side while in a a flat bed  without using rails? 1 - Total - Requires total assistance or cannot do it at all.   Moving from lying on your back to sitting on the side of a flat bed without using bed rails? 1 - Total - Requires total assistance or cannot do it at all.   Moving to and from a bed to a chair (including a wheelchair)? 1 - Total - Requires total assistance or cannot do it at all.   Standing up from a chair using your arms(e.g., wheelchair or bedside chair)? 1 - Total - Requires total assistance or cannot do it at all.   To walk in a hospital room? 1 - Total - Requires total assistance or cannot do it at all.   Climbing 3-5 steps with a railing? 1 - Total - Requires total assistance or cannot do it at all.   AMPAC Mobility Score 6   TARGET Highest Level of Mobility Mobility Level 2, Turn self in bed/bed activity/dependent transfer    ACTUAL Highest Level of Mobility Mobility Level 3, Sit on edge of bed    Therapeutic Exercise  Therapeutic Exercise Comments AROM x 4; PROM BLE    Neuromuscular Re-education  Neuromuscular Re-education Comments Static/dynamic seated PC training    Therapeutic Functional Activity  Therapeutic Functional Activity Comments Bed mobility; transfer training; Pt edu    Clinical Impression  Follow up Assessment Pt seen for PT f/u. Requires maxAx2 for bed mobility and transfer training. Noted R lateral lean seated EOB requiring cues to correct. PROM to BLE as tol for muscle length management and contracture prevention. Attempted STS w/ maxAx2 but unable to achieve full upright despite cues. Continues to show improved cognition. Pt edu on long-term positioning for contracture management/prevention. Will benefit from skilled services to improve gross functional ROM/strength, dynamic balance, and activity tolerance. current PT d/c rec is SNF for LTC.   Criteria for Skilled Therapeutic Interventions Met yes    Patient/Family Stated Goals  Patient/Family Stated Goal(s) decrease pain;feel better    Frequency/Equipment Recommendations  PT Frequency 2x per week   What day of week is next treatment expected? Monday   7/3  PT/PTA completing this assessment RL   Equipment Needs During Admission/Treatment Hospital bed    PT Recommendations for Inpatient Admission  Activity/Level of Assist dangle edge of bed;assist of 1   ADL Recommendations assist of 2   Positioning chair position of the bed   Therapeutic Exercise ROM as tolerated   Other/Comments * Pillow between knees in supine for contracture management and prevention of skin breakdown; encourage B knee extension in supine; encourage B podus boots or shorten bed or foam to maintain 0 deg of ankle DF    Planned Treatment / Interventions  Plan for Next Visit Progress as tol   Training Treatment / Interventions Balance / Investment banker, operational;Endurance Training;Functional Personnel officer / Interventions Patient Education / Training    PT Discharge Summary  Physical Therapy Disposition Recommendation SNF for long term placement     Danielle [name] PT, DPTMHB: [phone]

## 2022-06-19 NOTE — Progress Notes
[facility]		    [facility]													Internal Medicine Progress NoteInpatient Attending: Daryll Drown, MDHospital Day: 128Interval Events SubjectiveNo acute events overnight and denies any issues at this time. Per RN, patient has been eating her meals. Meds: Reviewed in chart Objective: Vitals:Temp:  [97.8 ?F (36.6 ?C)-98.6 ?F (37 ?C)] 98.6 ?F (37 ?C)Pulse:  [68-84] 68Resp:  [16-20] 16BP: (98-120)/(61-68) 98/62SpO2:  [96 %-100 %] 100 %Device (Oxygen Therapy): room air I/O's:Intake/Output Summary (Last 24 hours) at 06/19/2022 1302Last data filed at 06/19/2022 1100Gross per 24 hour Intake 594 ml Output 800 ml Net -206 ml  Physical Exam:Gen: Well appearing, no acute distressPulm: Breathing comfortably on room air. Abd: soft, nontender, ostomy in place, PEG tube in place without discharge Ext: Warm, well-perfused, contracted left foot/ankle with limited ROM in lower extremitiesNeuro: Responds to questions appropriately, no focal deficits appreciated. Labs: Reviewed in chart. 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 In Dec 2022, she was found to have abdominal abscess and had G tube placed for nutrition after Ethics was involved, and a STR facility was found in [place]. She was brought back to [facility] Feb 11 2022 due to ongoing encephalopathy and failure to thrive by sister, and the request was made for long term care by family. ?She has had extensive work up for encephalopathy, including MRI brain, L spine, paraneoplastic work up. No organic etiology identified. The decision has been made to await title 19 and long term placement with family. She has been approved for medicaid and in process of finding long term facility. #Nutrition w/ G tube in place- Appreciate dietician input- Encourage PO intake with calorie counts- Continue to hold tube feeds with plan to remove PEG tube this week- Following IR - No need for SLP. 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 Slightly decreased from last week to 10.5 6/25. Will continue to monitor. 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 Concern she will likely remain bed bound given prolonged course and the fact she is only getting PT 2x per week while inpatient awaiting placement. - Topical diclofenac gel - Provide topical lidocaine PRN for affected area- Encouraged more frequent PT- Encourage to wear prevalon boots in between sessions?# Iron Deficiency- Continue ferrous gluconate 324 mg PO QOD with breakfast and dinner?# Chronic polyneuropathy- Continue gabapentin 100 mg TIDHome meds:- Continue niacin, B6, thiamine Signed:Eryc Bodey, MD PGY-1Available on MHBJune 29, 20231:17 PM

## 2022-06-19 NOTE — Plan of Care
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 In Dec 2022, she was found to have abdominal abscess and had G tube placed for nutrition after Ethics was involved, and a STR facility was found in [place]. She was brought back to [facility] Feb 11 2022 due to ongoing encephalopathy and failure to thrive by sister, and the request was made for long term care by family. She has had extensive work up for encephalopathy, including MRI brain, L spine, paraneoplastic work up. No organic etiology identified. The decision has been made to await title 19 and long term placement with family   Subjective agreeable to treatmenat   Other Providers PT d/t case complexity and incr need for assist   General Observations supine in bed, cleared by nursing for treatment   Precautions/Limitations Fall Precautions;Bed alarm    Prior Level of Functioning/Social History  Additional Comments Per the chart report: Pt was brought to [facility] by her family from [facility]. Her sister, [name] has been appointed Acupuncturist (Hearing 05/01/22) and awaiting court decree. Plan is SNF LTC once medically cleared. PLOF is unclear as she has had a prolonged hospital course    Vital Signs and Orthostatic Vital Signs  Vital Signs Vital Signs Stable   Vital Signs Free text Per chart    Pain/Comfort  Pain Comment (Pre/Post Treatment Pain) Left L/E pain 7/10.  Nursing aware    Cognition  Overall Cognitive Status Within Functional Limits    Vision/ Hearing  Vision Assessment Results No vision deficits noted    Range of Motion  Range of Motion Examination bilateral upper extremity ROM was WNL   Range of Motion Comments bilateral L/E limited by pain, weakness and mild knee contractures.  Difficult to fully assess 2/2 to her pain    Muscle Tone  Muscle Tone Testing Results No muscle tone deficits noted    Coordination  Fine Motor Coordination [facility]    Sensory Assessment  Sensory Tests Results WFL except   Sensory Assessment Comments hypersensitivity in bilateral L/Es left worse than right    Skin Assessment  Skin Assessment See Nursing Documentation    Balance  Sitting Balance: Static  FAIR-      Contact Guard to maintain static position with no Assistive Device   Sitting Balance: Dynamic  POOR+   Moves through 1/2 range with minimal assist to right Kristain Filo   Standing Balance: Static POOR-     Maximal assist to maintain static position with no Assistive Device   Standing Balance: Dynamic  POOR-    Unable to move from midline voluntarily, maximal assist to right Claudeen Leason   Balance Assist Device Hand held assist     AM-PAC - Daily Activity IP Short Form  Help needed from another person putting on/taking off regular lower body clothing 1 - Unable   Help needed from another person for bathing (incl. washing, rinsing, drying) 2 - A Lot   Help needed from another person for toileting (incl. using toilet, bedpan, urinal) 1 - Unable   Help needed from another person putting  on/taking off regular upper body clothing 2 - A Lot   Help needed from another person taking care of personal grooming such as brushing teeth 4 - None   Help needed from another person eating meals 4 - None   AM-PAC Daily Activity Raw Score (Total of rows above) 14   CMS Score (based on Raw Score - with G Code) 14 - 59.67% impaired      (G Code - CK)    Bed Mobility  Rolling/Turning Right - Independence/Assistance Level Moderate assist Rolling/Turning Left - Independence/Assistance Level Moderate assist   Rolling/Turning Assist Device Bed rails   Supine-to-Sit Independence/Assistance Level Maximum assist;Assist of 2   Supine-to-Sit Assist Device Hand held assist;Head of bed elevated   Sit-to-Supine Independence/Assistance Level Maximum assist;Assist of 2   Sit-to-Supine Assist Device Hand held assist   Limitations decreased ability to use legs for bridging/pushing   Bed Mobility, Impairments pain;ROM decreased   Bed mobility was limited by: impaired motor function   Bed Mobility Comments + right side lean.  Able to briefly hold midline with cues    Sit-Stand Transfer Training  Sit-to-Stand Transfer Independence/Assistance Level Maximum assist;Assist of 2   Sit-to-Stand Transfer Assist Device Hand held assist   Stand-to-Sit Transfer Independence/Assistance Level Maximum assist;Assist of 2   Stand-to-Sit Transfer Assist Device Hand held assist   Sit-Stand Transfer Comments Unable to fully initiate stand.  acheived partical stand only.  Pt expressing fear of pain    Gait Training  Independence/Assistance Level  Unable to perform    Handoff Documentation  Handoff Patient in bed;Bed alarm;Patient instructed to call nursing for mobility;Discussed with nursing    Endurance  Endurance Comments fair-    Therapeutic Exercise  Therapeutic Exercise Comments AROM, PROM and stretching acheived to tolerance    Therapeutic Functional Activity  Therapeutic Functional Activity Comments functional mobility    Clinical Impression  Initial Assessment 06/19/22:  The patient presents with pain, decreased L/E ROM, decreased general strength/endurance, decreased balance impeding her ability to perform activities of daily living.  Skilled Occupational Therapy is benefical to provide treatment to address her functional deficits through therapeutic exercise, functional mobility training and ADL training to opitimze her ability to perform activities of daily living.    Frequency/Equipment Recommendations  OT Frequency 2x per week   What day of week is next treatment expected? Monday   06/23/22   Planned Treatment / Interventions  General Treatment / Interventions Adaptive Equipment Evaluation / Training;Discharge Planning;Range of Motion;Therapeutic Exercise;Activities of Daily Living   Training Treatment / Interventions Endurance Training;Functional Mobility Training;Strength Training   Education Treatment / Interventions Patient Education / Training    OT Discharge Summary  Disposition Recommendation Short Term Rehab     [place], [place] [phone]

## 2022-06-19 NOTE — Plan of Care
Plan of Care Overview/ Patient Status    1900-0700: Pt A&Ox3?d/o situation.?VSS on RA. Pt c/o  Pain to left ankle, prn Tramadol givne with good effect. Denies SOB, CP, N/V/D. Right sided ileostomy +flatus and stool.  Pt incontinent of urine, prn care provided,?Purewick in place and changed this shift.?PEG tube?in place, dressing ?clean dry and intact.?Good PO intake.?BLE elevated with pillows.?Hourly rounding. T&Rq2h. Pt resting in bed. Safety precautions maintained, bed alarm on, call bell in reach, will ctm and follow nursing pocProblem: Adult Inpatient Plan of CareGoal: Plan of Care ReviewOutcome: Interventions implemented as appropriateFlowsheets (Taken 06/19/2022 0029)Progress: no changePlan of Care Reviewed With: patient

## 2022-06-19 NOTE — Care Coordination-Inpatient
Follow up call placed to patient Danielle [name] sister Danielle [name] to inform her that the Medicaid QMB she has secured for her sister will not cover LTC. This Clinical research associate informed Danielle [name] that in order for her sister to receive LTC she will need to secure a [place] this Clinical research associate requested Danielle [name] contact the state and request a LTC policy with Medicaid.Danielle [name] states understanding of this need and informed this Clinical research associate  that she will contact the state and request the appropriate medicade policy. We are currently awaiting confirmation of a confirmed LTC policy.Care management will continue to follow along with the patient's medical team as needed.Troy JonesTransition CoordinatorCare Management ([facility])Ph#[phone]#[phone]#[phone]

## 2022-06-19 NOTE — Plan of Care
Plan of Care Overview/ Patient Status    0700-1900A&O: x4Cardiac: WDLResp: WDL on room airGI/GU: ileostomy. PEG tube removed  by IR at bedsideDiet: regular. Calorie count  continued. Good PO intake this shift Skin: incision from PEG removal, dressing placed by IR c/d/I  Pain: left leg pain see MAR for interventions Mobility: totalAccess: nonePills: whole Safety maintained, call bell in reach, bed alarm set, rounding continued.

## 2022-06-19 NOTE — Other
-  CONSULT  REQUEST  DOCUMENTATION-[facility] NOTE-Type of consult: [facility] Interventional Radiology -New Consult: [name] Danielle [name] /Location: 9719/9719-B / Brief Clinical Question: 66 yo female, remove G tube due to PO intake > goal./Callback Cell Phone: [phone] / Please confirm receipt of this message by texting back ?OK?-2 Glena [place] Page sent to Interventional Radiology [facility] [phone] at 1:24 PM.-Kahlani Graber Justice Deeds, PCT6/29/20231:24 [employer] [phone]

## 2022-06-19 NOTE — Consults
[place] Interventional RadiologyBrief Note IR re-consulted for gastrostomy tube removal.Patient has been consuming 61% of estimated calories per nutrition note and able to have tube removed. Will plan to remove later today.Thank you for involving Interventional Radiology in the care of your patient. Please text or call my Mobile Heart Beat with any questions or concerns.With urgent questions or concerns, please contact IR ZO:[id number] (pagers)- [facility]: [phone]- [facility]: [phone]- BH: [phone] (phone, all locations)- [phone] [facility] [place], [place] 06/19/2022

## 2022-06-20 ENCOUNTER — Encounter: Admit: 2022-06-20 | Payer: MEDICAID | Attending: Hepatology | Primary: Internal Medicine

## 2022-06-20 NOTE — Plan of Care
Danielle [name]					Location: EP 97/9719-B66 y.o., female				Attending: Daryll Drown, MD	Admit Date: 02/11/2022			[name] LOS: 129 days FOLLOW-UP NUTRITION ASSESSMENT Dietary Orders (From admission, onward)     Start     Ordered  06/18/22 0908  Nutrition Supplements  (Nutrition Supplement Panel)  Once      Comments: Vary flavors for pt- send TID with meals. Question Answer Comment Adult Supplements: Ensure Clear Apple (GH,BH,[facility],MH,[facility],WH)  Adult Supplements: Ensure Clear Berry (GH,BH,SRC,MH,LMH,WH)  Supplement Frequency: TID    06/18/22 0908  06/16/22 1432  Diet Tube Feed With Tray  (Diet Tube Feed Panel)  DIET EFFECTIVE NOW      Comments: HOLD TUBE FEEDS for 6/24-6/28- conduct calories counts in flow sheets- please record all meals and note if meal is skipped. RD to follow up 6/28 with results. Question Answer Comment Diet Selection? Regular  Safety Tray? (Suicide/Homicide/Police Custody) Safety Tray - No  Feeding Route: G-Tube  Initiate Nutrition Management Protocol (Yes/No?) Yes - Initiate Protocol    06/16/22 1432    No Known AllergiesANTHROPOMETRICS:Height:?70Admit wt:?79.4 kg (per SNF documentation.)Current wt:?72.4 kg (6.27) decreased 73.9?kg BMI:?22.9Dosing wt: ?74.8 kg (normal BMI)?Additional wt information:Ht confirmed with pt:?yesNow with 1+ from 1-2+?edema noted per flowsheets. Will ctm wts. Epic wt hx:? ? Wt: 05/21/22 74.8 kg 12/13/18 98.6 kg ??ESTIMATED NUTRITION REQUIREMENTS:Kcal/day:?1870????????????????????(25 ?kcal/kg)Protein/day:?75-90??????????????(1-1.2gm/kg)Fluids/day:?1870?mL/d?(3mL/kcal) ONLY as medical and fluid status allows and only per team discretion. Needs based on:?Dosing Wt (74.8?kg), maintenance??NUTRITION ASSESSMENT:?PT EMR reviewed for follow up assessment and calorie count review. Meds and labs reviewed. Since last nutrition assessment pt has continued to tolerate po intake, PEG now removed. Met with pt at bedside and endorses wonderful support from nursing and MD's for getting meals and snacks. Pt endorses wanting to do what ever she needs to do to eat well and avoid need for PEG in future. Pt endorses a good appetite and reports she is having 3 meals/day. Pt trialed the Ensure Clear and enjoys them so she has been taking 1-2 per day since admitted 2 days ago. ?Per nursing flow sheet pt is eating 100% of meals provided. Ordering behaviors continue to provide food >60% of needs and therefore feel no need to closly monitor calorie counts daily. ?Discussed with Team who is also graciously bring in snacks to help improve pt quality of life while here and supporting behavior of increased food intake to support nutrition needs off tube feeds. ??Cultural/Religious/Ethnic Needs:?None?Food Allergies: NKFA?NUTRITION DIAGNOSIS:Severe malnutrition r/t depletion aeb moderate muscle and fat depletion.- continues?- if wt remains stable and nutrition at supportive level would consider resolving malnutrition diagnosis.??INTERVENTIONS/RECOMMENDATIONS:?Meals and Snacks:-Continue with Regular trays- continue Ensure Clears TID- will follow up with assessment of calorie count at follow up. -Encourage protein rich food sources with each tray ordered to better meet protein need?Goal:?1. Pt will meet >60% of estimated needs to d/c enteral nutrition- meeting >60% of needs through assessment and with addition of oral supplements and snacks from providers.2. Pt to consume 2-3 Ensure Clear daily. ?Discharge Planning and Transfer of Nutrition Care:??Nutrition related discharge needs still being determined at this time, will continue to follow ??MONITORING / EVALUATION:?Food/Nutrition-Related History:Food and Nutrient IntakeEnteral and Parenteral Nutrition Intake?Anthropometric Measurements:Weight ?Biochemical Data, Medical Tests and Procedures:Electrolyte and renal profileGlucose/endocrine profile?Nutrition-Focus Physical FindingsOverall findingsElectronically signed by Sherrilee Gilles, RD, June 30, 2023[facility]- [phone] Please note: Nutrition is a consult only service on weekends and holidays.  Please enter a consult in EPIC if assistance is needed on a weekend or holiday or via [facility] [phone] to contact the covering RD.

## 2022-06-20 NOTE — Plan of Care
Plan of Care Overview/ Patient Status    Per DPR, patient remains medically cleared pending placement. Patient's Medicaid is QMB and not a LTC Medicaid. Per Marney Setting, he notified patient's Sister/Conservator [name], who stated she informed Social Security the type of Medicaid patient was needed and noted she will reach out to them again to enroll patient into correct Medicaid. [name] she is wanting [facility] for [employer] and [facility] [place] for LTC; referrals under review. CM will continue to follow and assist. Melina Copa, MSW, LCSWCare [phone]

## 2022-06-20 NOTE — Plan of Care
Plan of Care Overview/ Patient Status    1900-0700Pt A&Ox3, disoriented to situation. VSS on RA. Denies CP, SOB, N/V.?Inc?of urine, purewick in place.?R ileostomy appliance changed,?+stool and flatus.?Pills whole. Skin intact. q2h T&R. Safety precaution maintained. Bed alarm on. Hourly rounding performed.

## 2022-06-20 NOTE — Plan of Care
Plan of Care Overview/ Patient Status    0700-1900Pt is A&Ox3 dis to sit, reorientation provided, VSS on RA, denies pain, SOB, N/V,??ileostomy?x2?left?covered with dressing, right brown liquid stool, rosebud pink, QT&Rq2hr, oral care, independently performing. S/p peg removal. +UO, refused?Bilateral LE in podus boots, safety maintained.Problem: Adult Inpatient Plan of CareGoal: Plan of Care ReviewOutcome: Interventions implemented as appropriateGoal: Patient-Specific Goal (Individualized)Outcome: Interventions implemented as appropriateGoal: Absence of Hospital-Acquired Illness or InjuryOutcome: Interventions implemented as appropriateGoal: Optimal Comfort and WellbeingOutcome: Interventions implemented as appropriateGoal: Readiness for Transition of CareOutcome: Interventions implemented as appropriate Problem: InfectionGoal: Absence of Infection Signs and SymptomsOutcome: Interventions implemented as appropriate Problem: Physical Therapy GoalsGoal: Physical Therapy GoalsDescription: PT GOALS - addressed 05/02/22.1. Patient will demonstrate active range of motion x 4 extremities in preparation for mobility (GOAL MET)2. Caregivers will be independent and safely implement a range of motion/positioning program to prevent loss of range of motion and skin integrity (ONGOING)3. Patient will perform bed mobility with maximal assist (GOAL MET)4. Patient will tolerate sitting edge of bed >5 minutes with minimal assist (GOAL MET)5. Patient will perform transfers with moderate assist of 2 using rolling walker (ONGOING)6. Pt will perform bed mobility w/ modAx1 (ONGOING)7. Pt will perform STS transfer w/ modAx2 for a standing tolerance of 1 minute. (NEW) Outcome: Interventions implemented as appropriate Problem: Occupational Therapy GoalsGoal: Occupational Therapy GoalsDescription: OT goals1. Pt will move to EOB for seated ADL mod I2. Pt will follow 3/4 simple one step motor commands IND3. Pt will sequence through set up ADL task INDOT re-eval, Initial goals not met, continue to progress towards initial goalsOutcome: Interventions implemented as appropriate Problem: Speech Language Pathology GoalsGoal: Dysphagia Goals, SLPDescription: Current goal: Patient will tolerate regular solids and thin liquids without adverse impact on respiratory status and while maintaining adequate oral nutrition.Prior goal (met): The patient will tolerate least restrictive diet without any decline in respiratory status.Prior goal (met): Pt will tolerate pureed diet with thin liquids w/o s/x of aspiration or adverse impact on respiratory status.Outcome: Interventions implemented as appropriate Problem: Impaired Wound HealingGoal: Optimal Wound HealingOutcome: Interventions implemented as appropriate Problem: Fall Injury RiskGoal: Absence of Fall and Fall-Related InjuryOutcome: Interventions implemented as appropriate Problem: Skin Injury Risk IncreasedGoal: Skin Health and IntegrityOutcome: Interventions implemented as appropriate Problem: Mobility ImpairmentGoal: Optimal MobilityOutcome: Interventions implemented as appropriate Problem: Aspiration (Enteral Nutrition)Goal: Absence of Aspiration Signs and SymptomsOutcome: Interventions implemented as appropriate Problem: Device-Related Complication Risk (Enteral Nutrition)Goal: Safe, Effective Therapy DeliveryOutcome: Interventions implemented as appropriate Problem: Pain AcuteGoal: Acceptable Pain Control and Functional AbilityOutcome: Interventions implemented as appropriate Problem: Self-Care DeficitGoal: Improved Ability to Complete Activities of Daily LivingOutcome: Interventions implemented as appropriate Problem: Fluid, Electrolyte and Nutrition Imbalance (Ileostomy)Goal: Fluid, Electrolyte and Nutrition BalanceOutcome: Interventions implemented as appropriate

## 2022-06-21 NOTE — Plan of Care
Plan of Care Overview/ Patient Status    1900-0700: A/o x4, forgetful, VSS on room air. C/o b/l foot pain, prn tramadol, tyelnol, & gabapentin  given with positive effect. Denies SOB/chest pain. ostomy in place, CDI, purewick in place, incontinence care provided as needed. B/l lower extremity edema present, heels offloaded, please see flowsheet for full skin assessment. Pills whole. Assist x 2 in bed. T & R Q2 hours. Hourly rounding completed, safety maintained. Call bell within reach

## 2022-06-21 NOTE — Plan of Care
Plan of Care Overview/ Patient Status    A/Ox3. BP soft however asymptomatic, remaining VSS on RA. Patient states she did not get much sleep s/t left ankle pain last night however states is feeling better today. Still endorses significant BLE pain with movement, medicated per MAR and repositioned/elevated with pillows. Ileostomy with +stool/flatus. Purewick draining CY urine. Improved PO intake, patient drinking berry clear ensures. Safety maintained. Awaiting STR placement.

## 2022-06-21 NOTE — Progress Notes
[facility]		    [facility]													Internal Medicine Progress NoteInpatient Attending: Daryll Drown, MDHospital Day: 129Interval Events IR removed G tube yesterday. SubjectiveNo acute events overnight. Encouraged Ms. Difiore to work regularly with PT to improve mobility in the lower extremities. Denies any other issues at this time. Meds: Reviewed in chart Objective: Vitals:Temp:  [98.2 ?F (36.8 ?C)-98.6 ?F (37 ?C)] 98.2 ?F (36.8 ?C)Pulse:  [62-77] 62Resp:  [16-18] 18BP: (97-110)/(62-66) 97/62SpO2:  [95 %-100 %] 98 %Device (Oxygen Therapy): room air I/O's:Intake/Output Summary (Last 24 hours) at 06/20/2022 0720Last data filed at 06/20/2022 0626Gross per 24 hour Intake 474 ml Output 250 ml Net 224 ml  Physical Exam:Gen: Well appearing, no acute distressPulm: Breathing comfortably on room air. Abd: soft, nontender, ostomy in place. Wound dressing appropriately in place around prior G tube area. Ext: Warm, well-perfused, contracted left foot/ankle with limited ROM in lower extremitiesNeuro: Responds to questions appropriately, no focal deficits appreciated. Labs: Reviewed in chart. 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 In Dec 2022, she was found to have abdominal abscess and had G tube placed for nutrition after Ethics was involved, and a STR facility was found in [place]. She was brought back to [facility] Feb 11 2022 due to ongoing encephalopathy and failure to thrive by sister, and the request was made for long term care by family. ?She has had extensive work up for encephalopathy, including MRI brain, L spine, paraneoplastic work up. No organic etiology identified. The decision has been made to await title 19 and long term placement with family. She has been approved for medicaid and is in the process of finding long term facility. #Nutrition s/p G tube removal 6/29- Appreciate dietician input now that tube is removed. 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 Slightly decreased from last week to 10.5 6/25. Will continue to monitor. 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 Concern she will likely remain bed bound given prolonged course and the fact she is only getting PT 2x per week while inpatient awaiting placement. - Topical diclofenac gel - Provide topical lidocaine PRN for affected area- Encouraged more frequent PT- Encourage to wear prevalon boots in between sessions?# Iron Deficiency- Continue ferrous gluconate 324 mg PO QOD with breakfast and dinner?# Chronic polyneuropathy- Continue gabapentin 100 mg TIDHome meds:- Continue niacin, B6, thiamine Signed:Jennifer Payes, MD PGY-1Available on MHBJune 30, 20231:17 PM

## 2022-06-21 NOTE — Progress Notes
[facility]		    [facility]													Internal Medicine Progress NoteInpatient Attending: Daryll Drown, MDHospital Day: 130Interval Events No acute events overnightSubjectiveDenies any active issuesEncouraged to work with PTObjective: Vitals:Temp:  [97.7 ?F (36.5 ?C)-98.1 ?F (36.7 ?C)] 98.1 ?F (36.7 ?C)Pulse:  [65-80] 67Resp:  [18-20] 18BP: (86-102)/(53-68) 93/55SpO2:  [95 %-99 %] 98 %Device (Oxygen Therapy): room air I/O's:Intake/Output Summary (Last 24 hours) at 06/21/2022 0659Last data filed at 06/20/2022 2112Gross per 24 hour Intake -- Output 300 ml Net -300 ml  Physical Exam:Gen: Conversant in NADHEENT: MMM, no JVD, no carotid bruitCV: RRR, S1 S2, no murmursPulm: CTABAbd: Soft, BS+, ND/NT, ostomy in place, dressing around her prior G tube areaExt: 2+ symmetric distal pulses, no LE edema, contracted b/l foot and ankle with limited ROMNeuro: AAOx3, non-focalLabs: No new labsAssessment and Plan 66 yo F with RA, diverticular perforation, 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 In Dec 2022, she was found to have abdominal abscess and had G tube placed for nutrition after Ethics was involved, and a STR facility was found in [place]. She was brought back to [facility] Feb 11 2022 due to ongoing encephalopathy and failure to thrive by sister, and the request was made for long term care by family. ?She has had extensive work up for encephalopathy, including MRI brain, L spine, paraneoplastic work up. No organic etiology identified. The decision has been made to await title 19 and long term placement with family. She has been approved for medicaid and is in the process of tidal 19 and finding long term facility. #Nutrition s/p G tube removal 6/29- Appreciate dietician input now that tube is removed. 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 Slightly decreased from last week to 10.5 6/25. Will continue to monitor. 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 Concern she will likely remain bed bound given prolonged course. - Topical diclofenac gel - Provide topical lidocaine PRN for affected area- Encouraged more frequent PT- Encourage to wear prevalon boots in between sessions?# Iron Deficiency- Continue ferrous gluconate 324 mg PO QOD with breakfast and dinner?# Chronic polyneuropathy- Continue gabapentin 100 mg TIDHome meds:- Continue niacin, B6, thiamine Signed:Ly Bacchi, MD [facility] Internal Medicine PGY-3Reachable by MHB

## 2022-06-22 LAB — CALCIUM: BKR CALCIUM: 10.5 mg/dL — ABNORMAL HIGH (ref 8.8–10.2)

## 2022-06-22 LAB — BASIC METABOLIC PANEL
BKR ANION GAP: 10 (ref 7–17)
BKR BLOOD UREA NITROGEN: 12 mg/dL (ref 8–23)
BKR BUN / CREAT RATIO: 36.4 — ABNORMAL HIGH (ref 8.0–23.0)
BKR CALCIUM: 10.5 mg/dL — ABNORMAL HIGH (ref 8.8–10.2)
BKR CHLORIDE: 111 mmol/L — ABNORMAL HIGH (ref 98–107)
BKR CO2: 22 mmol/L (ref 20–30)
BKR CREATININE: 0.33 mg/dL — ABNORMAL LOW (ref 0.40–1.30)
BKR EGFR, CREATININE (CKD-EPI 2021): >60 mL/min/{1.73_m2} (ref >=60)
BKR GLUCOSE: 82 mg/dL (ref 70–100)
BKR POTASSIUM: 3.6 mmol/L (ref 3.3–5.3)
BKR SODIUM: 143 mmol/L (ref 136–144)

## 2022-06-22 LAB — MANUAL DIFFERENTIAL
BKR WAM ATYPICAL LYMPHOCYTES (DIFF) 1 DEC: 5.2 % — ABNORMAL HIGH (ref 0.0–1.0)
BKR WAM BASOPHIL - ABS (DIFF) 2 DEC: 0 x 1000/ÂµL (ref 0.00–1.00)
BKR WAM BASOPHILS (DIFF): 0 % (ref 0.0–1.4)
BKR WAM EOSINOPHILS (DIFF) 2 DEC: 0.26 x 1000/ÂµL (ref 0.00–1.00)
BKR WAM EOSINOPHILS (DIFF): 4.3 % (ref 0.0–5.0)
BKR WAM LYMPHOCYTE - ABS (DIFF) 2 DEC: 2.92 x 1000/ÂµL (ref 0.60–3.70)
BKR WAM LYMPHOCYTES (DIFF): 43.5 % (ref 17.0–50.0)
BKR WAM MONOCYTE - ABS (DIFF) 2 DEC: 0.37 x 1000/ÂµL (ref 0.00–1.00)
BKR WAM MONOCYTES (DIFF): 6.1 % (ref 4.0–12.0)
BKR WAM NEUTROPHILS (DIFF): 40.9 % (ref 39.0–72.0)
BKR WAM NEUTROPHILS - ABS (DIFF) 2 DEC: 2.45 x 1000/ÂµL (ref 2.00–7.60)

## 2022-06-22 LAB — CBC WITH AUTO DIFFERENTIAL
BKR WAM ABSOLUTE NRBC (2 DEC): 0 x 1000/ÂµL (ref 0.00–1.00)
BKR WAM HEMATOCRIT (2 DEC): 39.4 % (ref 35.00–45.00)
BKR WAM HEMOGLOBIN: 11.7 g/dL (ref 11.7–15.5)
BKR WAM MCH (PG): 27 pg (ref 27.0–33.0)
BKR WAM MCHC: 29.7 g/dL — ABNORMAL LOW (ref 31.0–36.0)
BKR WAM MCV: 91 fL (ref 80.0–100.0)
BKR WAM MPV: 11.1 fL (ref 8.0–12.0)
BKR WAM NUCLEATED RED BLOOD CELLS: 0 % (ref 0.0–1.0)
BKR WAM PLATELETS: 336 x1000/ÂµL (ref 150–420)
BKR WAM RDW-CV: 17.1 % — ABNORMAL HIGH (ref 11.0–15.0)
BKR WAM RED BLOOD CELL COUNT.: 4.33 M/ÂµL (ref 4.00–6.00)
BKR WAM WHITE BLOOD CELL COUNT: 6 x1000/ÂµL (ref 4.0–11.0)

## 2022-06-22 MED ORDER — ZZ IMS TEMPLATE
Freq: Once | ORAL | Status: CP
Start: 2022-06-22 — End: ?
  Administered 2022-06-22: 22:00:00 10 mEq via ORAL

## 2022-06-22 NOTE — Plan of Care
Plan of Care Overview/ Patient Status    1900-0700: A/o x4, forgetful, VSS on room air. C/o b/l foot pain, prn tramadol, tyelnol, & gabapentin  given with positive effect. Denies SOB/chest pain. ostomy in place, CDI, purewick in place, incontinence care provided as needed. B/l lower extremity edema present, heels offloaded, please see flowsheet for full skin assessment. Pills whole. Assist x 2 in bed. T & R Q2 hours. Hourly rounding completed, safety maintained. Call bell within reach

## 2022-06-22 NOTE — Progress Notes
[facility]		    [facility]													Internal Medicine Progress NoteInpatient Attending: Vennie Homans, MDHospital Day: 131Interval Events SubjectiveNo acute events overnight. Denies any issues and pain well controlled. Doing a good job eating and encouraged to keep progressing with PT of lower extremities. Meds: Reviewed in chart Objective: Vitals:Temp:  [97.8 ?F (36.6 ?C)-98.5 ?F (36.9 ?C)] 98 ?F (36.7 ?C)Pulse:  [62-65] 62Resp:  [16-18] 16BP: (90-100)/(56-60) 90/56SpO2:  [94 %-98 %] 94 %Device (Oxygen Therapy): room air I/O's:Intake/Output Summary (Last 24 hours) at 06/22/2022 1418Last data filed at 06/21/2022 2200Gross per 24 hour Intake -- Output 100 ml Net -100 ml  Physical Exam:Gen: Well appearing, no acute distressPulm: Breathing comfortably on room air. Abd: soft, nontender, ostomy in place.Ext: Warm, well-perfused, contracted left foot/ankle with limited ROM in lower extremitiesNeuro: Responds to questions appropriately, no focal deficits appreciated. Labs: Reviewed in chart. 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 In Dec 2022, she was found to have abdominal abscess and had G tube placed for nutrition after Ethics was involved, and a STR facility was found in [place]. She was brought back to [facility] Feb 11 2022 due to ongoing encephalopathy and failure to thrive by sister, and the request was made for long term care by family. ?She has had extensive work up for encephalopathy, including MRI brain, L spine, paraneoplastic work up. No organic etiology identified. The decision has been made to await title 19 and long term placement with family. She has been approved for medicaid and is in the process of finding long term facility. #Nutrition s/p G tube removal 6/29- Appreciate dietician input now that tube is removed. 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 Will continue to monitor. 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 Concern she will likely remain bed bound given prolonged course and the fact she is only getting PT 2x per week while inpatient awaiting placement. - Topical diclofenac gel - Provide topical lidocaine PRN for affected area- Encouraged more frequent PT- Encourage to wear prevalon boots in between sessions and use wedge for elevation?# Iron Deficiency- Continue ferrous gluconate 324 mg PO QOD with breakfast and dinner?# Chronic polyneuropathy- Continue gabapentin 100 mg TIDHome meds:- Continue niacin, B6, thiamine Signed:Sakina Briones Virgina Organ, MD PGY-1Available on MHBJuly 2, 20231:17 PM

## 2022-06-23 NOTE — Progress Notes
[facility]		    [facility]													Internal Medicine Progress NoteInpatient Attending: Gaspar Bidding, MDHospital Day: 132Interval Events No acute events overnightSubjectiveDenies any active issuesProvided some stretching on her ankles and feet in the bedsideEncouraged moving her feet while in bed and work well with PTEncouraged to eat wellObjective: Vitals:Temp:  [97.5 ?F (36.4 ?C)-97.9 ?F (36.6 ?C)] 97.5 ?F (36.4 ?C)Pulse:  [60-66] 66Resp:  [17-20] 20BP: (96-99)/(59-62) 99/62SpO2:  [96 %-99 %] 96 %Device (Oxygen Therapy): room air I/O's:Intake/Output Summary (Last 24 hours) at 06/23/2022 1627Last data filed at 06/23/2022 0900Gross per 24 hour Intake 237 ml Output 625 ml Net -388 ml  Physical Exam:Gen: Conversant in NADHEENT: MMM, no JVD, no carotid bruitCV: RRR, S1 S2, no murmursPulm: CTABAbd: Soft, BS+, ND/NT, ostomy in place, dressing around her prior G tube areaExt: 2+ symmetric distal pulses, no LE edema, improving b/l ankle ROM and movement, still can not do active ankle eversion and extension, able to do flexionNeuro: AAOx3, non-focalLabs: No new labsAssessment and Plan 66 yo F with RA, diverticular perforation, 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 In Dec 2022, she was found to have abdominal abscess and had G tube placed for nutrition after Ethics was involved, and a STR facility was found in [place]. She was brought back to [facility] Feb 11 2022 due to ongoing encephalopathy and failure to thrive by sister, and the request was made for long term care by family. ?She has had extensive work up for encephalopathy, including MRI brain, L spine, paraneoplastic work up. No organic etiology identified. The decision has been made to await title 19 and long term placement with family. She has been approved for medicaid and is in the process of tidal 19 and finding long term facility. 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 Slightly decreased from last week to 10.5 6/25. -Will continue to monitor. 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 Concern she will likely remain bed bound given prolonged course. - Topical diclofenac gel - Provide topical lidocaine PRN for affected area- Encouraged more frequent PT- Encourage to wear prevalon boots in between sessions?# Iron Deficiency- Continue ferrous gluconate 324 mg PO QOD with breakfast and dinner?# Chronic polyneuropathy- Continue gabapentin 100 mg TIDHome meds:- Continue niacin, B6, thiamine Signed:Aquan Kope, MD [facility] Internal Medicine PGY-3Reachable by MHB

## 2022-06-23 NOTE — Plan of Care
Plan of Care Overview/ Patient Status    A/Ox3. BP soft however asymptomatic, remaining VSS on RA. Patient with minimal c/o BLE pain even with movement on current pain regimen per MAR. Improved PO intake, Tolerating 75-100% meals, drinking berry ensures. Ileostomy with +stool/flatus. Purewick draining CY urine. Bed bath provided. Old peg tube site&fistula dressing changed with gauze, scant brown drainage. Safety maintained. Awaiting STR placement.

## 2022-06-23 NOTE — Progress Notes
Generalist Attending Progress Note:I have seen and examined Danielle [name] on 06/23/22.  Hospital course and reason for admission discussed with the housestaff team and chart reviewed.  Pt is an 66 yo F with hx RA, diverticular perforation with multiple abdominal surgeries, bilateral DVTs, recent prolonged admission in [place], brought to [facility] in 2/23, hospital course c/b persistent metabolic encephalopathy and failure to thrive, now awaiting title 19 and transition to long term care facility.  Will need to clarify status of Medicaid application with Care Coordination team and preferably review advanced directive and pt's overall goals of care given her prolonged hospitalization.

## 2022-06-23 NOTE — Plan of Care
Plan of Care Overview/ Patient Status    1900-0700: A/o x4, forgetful, VSS on room air. C/o b/l foot pain, prn tramadol, tyelnol, & gabapentin  given with positive effect. Denies SOB/chest pain. ostomy in place, CDI, purewick in place, incontinence care provided as needed. B/l lower extremity edema present, heels offloaded, please see flowsheet for full skin assessment. Pills whole. Assist x 2 in bed. T & R Q2 hours. Hourly rounding completed, safety maintained. Call bell within reach

## 2022-06-23 NOTE — Plan of Care
Inpatient Physical Therapy Progress Note IP Adult PT Eval/Treat - 06/23/22 0952    Date of Visit / Treatment  Date of Visit / Treatment 06/23/22   Progress Report Due 07/02/22   End Time 0952    General Information  Subjective Agreeable to PT session   General Observations Supine in bed. Cleared by RN.   Precautions/Limitations Fall Precautions;Bed alarm    Vital Signs and Orthostatic Vital Signs  Vital Signs Vital Signs Stable   Vital Signs Free text RA, NAD    Pain/Comfort  Pain Comment (Pre/Post Treatment Pain) LLE pain with movement. 7/10. RN aware.    Patient Coping  Observed Emotional State accepting;cooperative   Diversional Activities television    Cognition  Overall Cognitive Status WFL   Orientation Level Oriented X4   Level of Consciousness alert   Following Commands Follows one step commands with repetition   Personal Safety / Judgment Fall risk    Manual Muscle Testing  Manual Muscle Testing Comments +Deconditioned    Skin Assessment  Skin Assessment See Nursing Documentation    Balance  Sitting Balance: Static  FAIR-      Contact Guard to maintain static position with no Assistive Device   Sitting Balance: Dynamic  POOR+   Moves through 1/2 range with minimal assist to right self    Bed Mobility  Supine-to-Sit Independence/Assistance Level Maximum assist   Supine-to-Sit Assist Device Head of bed elevated;Hand held assist;Draw pad   Sit-to-Supine Independence/Assistance Level Maximum assist   Sit-to-Supine Assist Device Hand held assist   Bed Mobility Comments Assist with BLEs and trunk. Ax1 to maintain sitting balance.    Museum/gallery curator Comments Pt deferring despite max enocuragement and EDU.    Handoff Documentation  Handoff Patient in bed;Bed alarm;Patient instructed to call nursing for mobility;Discussed with nursing    Endurance  Endurance Comments fair- PT- AM-PAC - Basic Mobility Screen- How much help from another person do you currently need.....  Turning from your back to your side while in a a flat bed without using rails? 1 - Total - Requires total assistance or cannot do it at all.   Moving from lying on your back to sitting on the side of a flat bed without using bed rails? 1 - Total - Requires total assistance or cannot do it at all.   Moving to and from a bed to a chair (including a wheelchair)? 1 - Total - Requires total assistance or cannot do it at all.   Standing up from a chair using your arms(e.g., wheelchair or bedside chair)? 1 - Total - Requires total assistance or cannot do it at all.   To walk in a hospital room? 1 - Total - Requires total assistance or cannot do it at all.   Climbing 3-5 steps with a railing? 1 - Total - Requires total assistance or cannot do it at all.   AMPAC Mobility Score 6   TARGET Highest Level of Mobility Mobility Level 2, Turn self in bed/bed activity/dependent transfer    ACTUAL Highest Level of Mobility Mobility Level 3, Sit on edge of bed    Therapeutic Exercise  Therapeutic Exercise Comments AA/AROM x4    Neuromuscular Re-education  Neuromuscular Re-education Comments Static/dynamic sitting balance    Therapeutic Functional Activity  Therapeutic Functional Activity Comments Bed mobility, pt EDU    Clinical Impression  Follow up Assessment Pt seen for PT follow up session. Tolerated bed mobility, sitting balance activities, and therex. Ax2  with mobility. Despite max encouragement Pt deferring STS attempt this session. Deficits noted in strength, ROM, balance and activity tolerance. Will benefit from continued skilled PT to improve functional mobility.    Patient/Family Stated Goals  Patient/Family Stated Goal(s) feel better    Frequency/Equipment Recommendations  PT Frequency 2x per week   What day of week is next treatment expected? Thursday   7/5 PT/PTA completing this assessment Patsey Berthold    PT Recommendations for Inpatient Admission  Activity/Level of Assist dangle edge of bed;assist of 2   Therapeutic Exercise ROM as tolerated    Planned Treatment / Interventions  Plan for Next Visit Progress as tolerated   Education Treatment / Interventions Patient Education / Training    PT Discharge Summary  Physical Therapy Disposition Recommendation SNF for long term placement     Lemmie Evens, [place]: [phone]

## 2022-06-24 NOTE — Progress Notes
[facility]		    [facility]													Internal Medicine Progress NoteInpatient Attending: Gaspar Bidding, MDHospital Day: 133Interval Events SubjectiveNo acute events overnight. Denies any issues and pain is overall under control in lower extremities with slightly improved mobility of ankles. Mentioned to have some red output in ileostomy which is likely related to berry ensure clear drink.Meds: Reviewed in chart Objective: Vitals:Temp:  [97.5 ?F (36.4 ?C)-97.8 ?F (36.6 ?C)] 97.5 ?F (36.4 ?C)Pulse:  [66-85] 71Resp:  [16-20] 20BP: (99-110)/(62-67) 110/65SpO2:  [96 %-98 %] 98 %Device (Oxygen Therapy): room air I/O's:Intake/Output Summary (Last 24 hours) at 06/24/2022 1213Last data filed at 06/24/2022 0900Gross per 24 hour Intake 427 ml Output 250 ml Net 177 ml  Physical Exam:Gen: Well appearing, no acute distressPulm: Breathing comfortably on room air. Abd: soft, nontender, ostomy in place.Ext: Warm, well-perfused, contracted left foot/ankle with limited ROM in lower extremitiesNeuro: Responds to questions appropriately, no focal deficits appreciated. Labs: Reviewed in chart. 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 In Dec 2022, she was found to have abdominal abscess and had G tube placed for nutrition after Ethics was involved, and a STR facility was found in [place]. She was brought back to [facility] Feb 11 2022 due to ongoing encephalopathy and failure to thrive by sister, and the request was made for long term care by family. ?She has had extensive work up for encephalopathy, including MRI brain, L spine, paraneoplastic work up. No organic etiology identified. The decision has been made to await title 19 and long term placement with family. She has been approved for medicaid and is in the process of finding long term facility. #Nutrition s/p G tube removal 6/29- Appreciate dietician input now that tube is removed. 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 Will continue to monitor. 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 Concern she will likely remain bed bound given prolonged course and the fact she is only getting PT 2x per week while inpatient awaiting placement. - Topical diclofenac gel - Provide topical lidocaine PRN for affected area- Encouraged more frequent PT- Encourage to wear prevalon boots in between sessions and use wedge for elevation?# Iron Deficiency- Continue ferrous gluconate 324 mg PO QOD with breakfast and dinner?# Chronic polyneuropathy- Continue gabapentin 100 mg TIDHome meds:- Continue niacin, B6, thiamine Signed:Cory Rama Virgina Organ, MD PGY-1Available on MHBJuly 4, 20231:17 PM

## 2022-06-24 NOTE — Plan of Care
Plan of Care Overview/ Patient Status    0700-1900Pt is A&Ox4. VSS, soft BP on R.A. Takes pills whole.  T&R q2hrs. Doesn't get OOB. Hurts when moving patient. Eats well and loves fruits. Safety maintained w Salvadore Oxford rounding, call bell within reach, and bed in lowest position.  Problem: Adult Inpatient Plan of CareGoal: Plan of Care ReviewOutcome: Interventions implemented as appropriateGoal: Patient-Specific Goal (Individualized)Outcome: Interventions implemented as appropriateGoal: Absence of Hospital-Acquired Illness or InjuryOutcome: Interventions implemented as appropriateGoal: Optimal Comfort and WellbeingOutcome: Interventions implemented as appropriate

## 2022-06-24 NOTE — Plan of Care
Plan of Care Overview/ Patient Status    Pt is a&Ox4. VSS, soft BP on R.A. Takes pills whole. Refuses certain lotions until itchy and really needs them. Lidocaine patch in place. T&R q2hrs. Doesn't get OOB. Hurts when moving patient. Eats well, loves fruits. Safety maintained w Salvadore Oxford rounding, call bell within reach, and bed in lowest position.  Problem: Adult Inpatient Plan of CareGoal: Plan of Care ReviewOutcome: Interventions implemented as appropriateGoal: Patient-Specific Goal (Individualized)Outcome: Interventions implemented as appropriateGoal: Absence of Hospital-Acquired Illness or InjuryOutcome: Interventions implemented as appropriateGoal: Optimal Comfort and WellbeingOutcome: Interventions implemented as appropriate

## 2022-06-24 NOTE — Plan of Care
Plan of Care Overview/ Patient Status    1900-0700Patient is A&Ox4. VSS on RA, refused PM vitals. C/O left hip pain and bilateral heel pain. PRN tramadol and tylenol administered. Takes pills whole. Illeostomy with red/pink output, MD aware, no abd pain. Site from previous PEG, covered with a dressing drink and intact. AX2 turn and reposition q2hr. Blanchable redness to L heel, heels elevated. Specialty bed in place, Safety checks and hourly rounding performed, see flow sheet and MAR for details. Problem: Adult Inpatient Plan of CareGoal: Plan of Care ReviewOutcome: Interventions implemented as appropriateGoal: Patient-Specific Goal (Individualized)Outcome: Interventions implemented as appropriateGoal: Absence of Hospital-Acquired Illness or InjuryOutcome: Interventions implemented as appropriateGoal: Optimal Comfort and WellbeingOutcome: Interventions implemented as appropriateGoal: Readiness for Transition of CareOutcome: Interventions implemented as appropriate Problem: InfectionGoal: Absence of Infection Signs and SymptomsOutcome: Interventions implemented as appropriate Problem: Physical Therapy GoalsGoal: Physical Therapy GoalsDescription: PT GOALS - addressed 05/02/22.1. Patient will demonstrate active range of motion x 4 extremities in preparation for mobility (GOAL MET)2. Caregivers will be independent and safely implement a range of motion/positioning program to prevent loss of range of motion and skin integrity (ONGOING)3. Patient will perform bed mobility with maximal assist (GOAL MET)4. Patient will tolerate sitting edge of bed >5 minutes with minimal assist (GOAL MET)5. Patient will perform transfers with moderate assist of 2 using rolling walker (ONGOING)6. Pt will perform bed mobility w/ modAx1 (ONGOING)7. Pt will perform STS transfer w/ modAx2 for a standing tolerance of 1 minute. (NEW) Outcome: Interventions implemented as appropriate Problem: Occupational Therapy GoalsGoal: Occupational Therapy GoalsDescription: OT goals1. Pt will move to EOB for seated ADL mod I2. Pt will follow 3/4 simple one step motor commands IND3. Pt will sequence through set up ADL task INDOT re-eval, Initial goals not met, continue to progress towards initial goalsOutcome: Interventions implemented as appropriate Problem: Speech Language Pathology GoalsGoal: Dysphagia Goals, SLPDescription: Current goal: Patient will tolerate regular solids and thin liquids without adverse impact on respiratory status and while maintaining adequate oral nutrition.Prior goal (met): The patient will tolerate least restrictive diet without any decline in respiratory status.Prior goal (met): Pt will tolerate pureed diet with thin liquids w/o s/x of aspiration or adverse impact on respiratory status.Outcome: Interventions implemented as appropriate Problem: Impaired Wound HealingGoal: Optimal Wound HealingOutcome: Interventions implemented as appropriate Problem: Fall Injury RiskGoal: Absence of Fall and Fall-Related InjuryOutcome: Interventions implemented as appropriate Problem: Skin Injury Risk IncreasedGoal: Skin Health and IntegrityOutcome: Interventions implemented as appropriate Problem: Mobility ImpairmentGoal: Optimal MobilityOutcome: Interventions implemented as appropriate Problem: Aspiration (Enteral Nutrition)Goal: Absence of Aspiration Signs and SymptomsOutcome: Outcome(s) achieved Problem: Device-Related Complication Risk (Enteral Nutrition)Goal: Safe, Effective Therapy DeliveryOutcome: Interventions implemented as appropriate Problem: Pain AcuteGoal: Acceptable Pain Control and Functional AbilityOutcome: Interventions implemented as appropriate Problem: Self-Care DeficitGoal: Improved Ability to Complete Activities of Daily LivingOutcome: Interventions implemented as appropriate Problem: Fluid, Electrolyte and Nutrition Imbalance (Ileostomy)Goal: Fluid, Electrolyte and Nutrition BalanceOutcome: Interventions implemented as appropriate Problem: Infection (Ileostomy)Goal: Absence of Infection Signs and SymptomsOutcome: Interventions implemented as appropriate Problem: Confusion ChronicGoal: Optimal Cognitive FunctionOutcome: Outcome(s) achieved

## 2022-06-25 NOTE — Progress Notes
[facility]		    [facility]													Internal Medicine Progress NoteInpatient Attending: Gaspar Bidding, MDHospital Day: 134Interval Events No acute events overnight.SubjectiveOverall feeling wellBrother in the bedside provided her with the rubber bands to help with physical activityNo major complain of painGood PO intakeMeds: Scheduled Meds:Current Facility-Administered Medications Medication Dose Route Frequency Provider Last Rate Last Admin ? Banatrol Plus oral powder packet  1 packet Oral TID Jone Baseman, MD   1 packet at 06/25/22 [phone] ? camphor-menthoL (SARNA) lotion   Topical (Top) BID Manzon, Berkley Harvey, MD   Given at 06/25/22 [phone] ? diclofenac (VOLTAREN) 1 % gel 4 g  4 g Topical (Top) 4x Daily Larna Daughters, MD   4 g at 06/25/22 0847 ? enoxaparin (LOVENOX) injection 80 mg  80 mg Subcutaneous Q12H Abid, Bushra, MD   80 mg at 06/25/22 0841 ? famotidine (PEPCID) tablet 20 mg  20 mg Oral BID Pryor Ochoa, MD   20 mg at 06/25/22 0841 ? ferrous gluconate (FERGON) tablet 324 mg  324 mg Oral Every Other Day Larna Daughters, MD   324 mg at 06/25/22 0841 ? folic acid (FOLVITE) tablet 1 mg  1 mg Oral Daily Pryor Ochoa, MD   1 mg at 06/25/22 0841 ? gabapentin (NEURONTIN) capsule 100 mg  100 mg Oral TID Marlon Pel, MD   100 mg at 06/25/22 0841 ? lidocaine (LIDODERM) 5 % 1 patch  1 patch Transdermal Q24H Jerral Ralph, MD   1 patch at 06/24/22 1518 ? melatonin tablet 3 mg  3 mg Oral Nightly Pryor Ochoa, MD   3 mg at 06/24/22 2125 ? niacin Immediate Release tablet 100 mg  100 mg Oral Nightly Pryor Ochoa, MD   100 mg at 06/24/22 2125 ? nystatin (MYCOSTATIN) powder 1 Application  1 Application  Topical (Top) BID Henriette Combs, MD   1 Application  at 06/25/22 [phone] ? predniSONE (DELTASONE) tablet 5 mg  5 mg Oral Daily Jerral Ralph, MD   5 mg at 06/25/22 0841 ? pyridoxine (vitamin B6) (B-6) tablet 100 mg  100 mg Oral Daily Pryor Ochoa, MD   100 mg at 06/25/22 0840 ? rosuvastatin (CRESTOR) tablet 10 mg  10 mg Per G Tube Nightly Dorisann Frames, MD   10 mg at 06/24/22 2125 ? sodium chloride 0.9 % flush 3 mL  3 mL IV Push Q8H Dorisann Frames, MD   3 mL at 05/26/22 8119 ? thiamine (VITAMIN B1) tablet 100 mg  100 mg Oral BID Pryor Ochoa, MD   100 mg at 06/25/22 0841 Continuous Infusions:PRN Meds: ? acetaminophen  975 mg Oral Q6H PRN ? dextrose (GLUCOSE) oral liquid 15 g  15 g Oral Q15 MIN PRN  Or ? fruit juice  120 mL Oral Q15 MIN PRN  Or ? skim milk  240 mL Oral Q15 MIN PRN ? dextrose (GLUCOSE) oral liquid 30 g  30 g Oral Q15 MIN PRN  Or ? fruit juice  240 mL Oral Q15 MIN PRN ? dextrose injection  12.5 g Intravenous Q15 MIN PRN ? dextrose injection  25 g Intravenous Q15 MIN PRN ? gabapentin  100 mg Oral BID PRN ? glucagon  1 mg Intramuscular Once PRN ? lidocaine   Topical (Top) BID PRN ? methyl salicylate-menthol   Topical (Top) BID PRN ? ondansetron  4 mg Oral Q6H PRN  Or ? ondansetron (ZOFRAN) IV Push  4 mg IV Push Q6H PRN ? polyethylene glycol  17 g Oral DAILY PRN ? sodium  chloride  3 mL IV Push PRN for Line Care ? traMADoL  25 mg Oral Q12H PRN Objective: Vitals:Temp:  [98 ?F (36.7 ?C)-98.4 ?F (36.9 ?C)] 98.1 ?F (36.7 ?C)Pulse:  [63-79] 63Resp:  [17-20] 20BP: (99-106)/(64-70) 101/65SpO2:  [96 %-99 %] 99 %Device (Oxygen Therapy): room air I/O's:Intake/Output Summary (Last 24 hours) at 06/25/2022 1127Last data filed at 06/25/2022 1053Gross per 24 hour Intake 474 ml Output 275 ml Net 199 ml  Physical Exam:Gen: Well appearing, no acute distressPulm: Breathing comfortably on room air. Abd: soft, nontender, ostomy in place.Ext: Warm, well-perfused, contracted left foot/ankle with intact passive ROM but weakness in extension and eversionNeuro: Alert and oriented x3, responds to questions appropriately, no focal deficits appreciated. Labs: CBCRecent Labs Lab 07/02/230523 WBC 6.0 HGB 11.7 HCT 39.40 PLT 336 MCV 91.0  No results for input(s): NEUTROPHILS, LABLYMP, LABEOS, BANDSP in the last 168 hours. ChemistryRecent Labs Lab 07/02/230523 NA 143 K 3.6 CL 111* CO2 22 BUN 12 CREATININE 0.33* GLU 82 ANIONGAP 10  Recent Labs Lab 07/02/230523 CALCIUM 10.5* - 10.5*  LFTsNo results for input(s): ALT, AST, ALKPHOS, BILITOT, BILIDIR in the last 168 hours. CoagsNo results for input(s): PTT, LABPROT, INR, FIBRINOGEN, DDIMER in the last 168 hours. 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 In Dec 2022, she was found to have abdominal abscess and had G tube placed for nutrition after Ethics was involved, and a STR facility was found in [place]. She was brought back to [facility] Feb 11 2022 due to ongoing encephalopathy and failure to thrive by sister, and the request was made for long term care by family. ?She has had extensive work up for encephalopathy, including MRI brain, L spine, paraneoplastic work up. No organic etiology identified. The decision has been made to await title 19 and long term placement with family. She has been approved for medicaid and is in the process of finding long term facility. #Nutrition s/p G tube removal 6/29- Appreciate dietician input now that tube is removed. 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 -Will continue to monitor.-Should check albumin on next blood draw - Can consider PTH antagonist if hypercalcemia worsening and symptomatic# Rheumatoid Arhtiris- was seen by rheumatology during admission- continue prednisone 5mg  daily- needs PT and OT?#immobility, contractures - concern for developing contracture of left ankle/foot; patient declines PODUS boots and cannot tolerate resting on foam wedge despite many reminders that this will prevent contractures. Concern she will likely remain bed bound given prolonged course and the fact she is only getting PT 3x per week while inpatient awaiting placement. - Topical diclofenac gel - Provide topical lidocaine PRN for affected area- Encouraged more frequent PT (x3 per week)- Encourage to wear prevalon boots in between sessions and use wedge for elevation- Encourage physical activity with rubberbands?# Iron Deficiency- Continue ferrous gluconate 324 mg PO QOD with breakfast and dinner?# Chronic polyneuropathy- Continue gabapentin 100 mg TIDHome meds:- Continue niacin, B6, thiamine Signed:Mabrey Howland, MD MPHYale Internal Medicine PGY-1Reachable by MHBJuly 5, 20234:17 PM

## 2022-06-25 NOTE — Plan of Care
Plan of Care Overview/ Patient Status    Problem: Adult Inpatient Plan of CareGoal: Readiness for Transition of CareOutcome: Interventions implemented as appropriate Patient remains medically cleared to discharge to LTC facility.  Title 19 application still pending. Conservator working on gathering required documentation with some difficulty.  Care Manager will continue to follow with team.Reisa Coppola Hinda Lenis, MSW/CMCare ManagementYale Va Medical Center [facility] [phone]

## 2022-06-25 NOTE — Plan of Care
Plan of Care Overview/ Patient Status    0730-1900: A&OX3-4, forgetful to situation at times. VSS on room air. Good PO intake with tray set-up. Pills whole. Incontinent of urine, care provided, purewick in place. Did not assist with ostomy care, +stool/flatus output. Turned & repositioned in bed q2 hours. Specialty bed in place. Heels elevated off bed on pillows. AX2 to turn in bed. Report given to on coming RNProblem: Adult Inpatient Plan of CareGoal: Plan of Care ReviewOutcome: Interventions implemented as appropriateFlowsheets (Taken 06/25/2022 1944)Progress: no changePlan of Care Reviewed With: patient Problem: Adult Inpatient Plan of CareGoal: Patient-Specific Goal (Individualized)Outcome: Interventions implemented as appropriateFlowsheets (Taken 06/25/2022 0809)What Anxieties, Fears, Concerns or Questions Do You Have About Your Care?: none statedIndividualized Preferences and Care Needs: updates on pocPatient Centered Long Term Goal: safe d/cPatient Centered Daily Goal: placement

## 2022-06-25 NOTE — Plan of Care
Plan of Care Overview/ Patient Status    1900 - 0700 A&Ox4. Resting in bed overnight. VSS on RA. Reported moderate/severe pain to L shoulder. PRN tramadol and other scheduled analgesics continued. Ileostomy intact w/liquid output. Dressing to former PEG site changed. T&P'd as tolerated, pt often refuses, education provided. Appeared to rest comfortably overnight. Continues to await placement. Hourly rounding performed and safety maintained. Please continue to follow POC. See flow sheets for complete assessment. Problem: Adult Inpatient Plan of CareGoal: Plan of Care ReviewOutcome: Interventions implemented as appropriateFlowsheets (Taken 06/24/2022 2336)Progress: no changePlan of Care Reviewed With: patient

## 2022-06-26 NOTE — Plan of Care
Plan of Care Overview/ Patient Status    0730-1900: no changes to poc. T& repositioned in bed q2 hours. Specialty bed in place. Heels elevated off bed on pillows. Colostomy bag changed, +stool/flatus. See flowsheets for full assessment. Report given to on coming RNProblem: Adult Inpatient Plan of CareGoal: Plan of Care ReviewOutcome: Interventions implemented as appropriateFlowsheetsTaken 06/26/2022 1956 by Sherryll Burger, RNPlan of Care Reviewed With: patientTaken 06/26/2022 0002 by Jones Skene, RNProgress: no change Problem: Adult Inpatient Plan of CareGoal: Patient-Specific Goal (Individualized)Outcome: Interventions implemented as appropriateFlowsheets (Taken 06/26/2022 1100)What Anxieties, Fears, Concerns or Questions Do You Have About Your Care?: none statedIndividualized Preferences and Care Needs: updates on pocPatient Centered Long Term Goal: safe d/cPatient Centered Daily Goal: safety

## 2022-06-26 NOTE — Plan of Care
Inpatient Physical Therapy Progress Note IP Adult PT Eval/Treat - 06/26/22 1135    Date of Visit / Treatment  Date of Visit / Treatment 06/26/22   Note Type Progress Note   Progress Report Due 07/02/22   End Time 1135    General Information  Subjective Let's get it over with now.   General Observations Pt received supine in bed on 9-7; on RA; NAD; ileostomy; purwick; RN cleared   Precautions/Limitations Fall Precautions;Bed alarm    Weight Bearing Status  Weight Bearing Status Comments WBAT BLE    Vital Signs and Orthostatic Vital Signs  Vital Signs Vital Signs Stable   Vital Signs Free text RA, NAD    Pain/Comfort  Pain Comment (Pre/Post Treatment Pain) Pt c/o unquantified BLE pain (L>R) w/ mobilization; RN aware and repositioned for comfort    Patient Coping  Observed Emotional State accepting;cooperative   Verbalized Emotional State acceptance    Cognition  Overall Cognitive Status WFL   Orientation Level Oriented X4   Level of Consciousness alert   Following Commands Follows one step commands without difficulty   Personal Safety / Judgment Fall risk;Requires supervision with mobility   Cognition Comments Pleasant; requires reassurance and motivation at times    Range of Motion  Range of Motion Comments bilateral knees lacking ~20 degrees of full extension, B ankles beginning plantar flexion contractures, lacking 20 degrees to neutral passively, LUE WFL passively, RUE limited to ~ 100 deg PROM shld flexion    Manual Muscle Testing  Manual Muscle Testing Comments + Deconditioned    Musculoskeletal  LUE Muscle Strength Grading 3-->active movement against gravity   RUE Muscle Strength Grading --   Shld: 3-/5; elbow/hand: 3/5  LLE Muscle Strength Grading --   hip: 2+/5; knee: 2/5; ankle: 1+/5  RLE Muscle Strength Grading --   hip: 2+/5; knee: 3-/5: ankle: 2-/5   Skin Assessment  Skin Assessment See Nursing Documentation    Posture, Head/Trunk Alignment  Posture, Head/Trunk Alignment Right lateral lean;Forward head;Rounded shoulders;Kyphotic    Balance  Sitting Balance: Static  POOR      Moderate assist to maintain static position with no Assistive Device   Sitting Balance: Dynamic  POOR     Moves through 1/4 to 1/2 ROM range with moderate assist to right self   Standing Balance: Static POOR-     Maximal assist to maintain static position with no Assistive Device   Standing Balance: Dynamic  POOR-    Unable to move from midline voluntarily, maximal assist to right self   Balance Assist Device Hand held assist   Balance Skills Training Comment Ax1    Bed Mobility  Supine-to-Sit Independence/Assistance Level Maximum assist;Assist of 1   Supine-to-Sit Assist Device Hand held assist;Head of bed elevated   Sit-to-Supine Independence/Assistance Level Maximum assist;Assist of 1   Sit-to-Supine Assist Device Hand held assist;Head of bed elevated   Bed Mobility, Impairments ROM decreased;strength decreased;postural control impaired;pain   Bed Mobility Comments PROM to BLE supine as warm-up; Assist to BLE and trunk supine<>sit; excessive R lateral lean seated EOB requiring cues for midline; performs BLE therex, crossbody reaches seated EOB; cues for upright posture; deferred standing d/t pain, weakness, and incr need for skilled assist    Handoff Documentation  Handoff Patient in bed;Bed alarm;Patient instructed to call nursing for mobility;Discussed with nursing    Endurance  Endurance Comments Fair-    PT- AM-PAC - Basic Mobility Screen- How much help from another person do you currently need...Marland KitchenMarland Kitchen  Turning from your back to your side while in a a flat bed without using rails? 1 - Total - Requires total assistance or cannot do it at all.   Moving from lying on your back to sitting on the side of a flat bed without using bed rails? 1 - Total - Requires total assistance or cannot do it at all.   Moving to and from a bed to a chair (including a wheelchair)? 1 - Total - Requires total assistance or cannot do it at all.   Standing up from a chair using your arms(e.g., wheelchair or bedside chair)? 1 - Total - Requires total assistance or cannot do it at all.   To walk in a hospital room? 1 - Total - Requires total assistance or cannot do it at all.   Climbing 3-5 steps with a railing? 1 - Total - Requires total assistance or cannot do it at all.   AMPAC Mobility Score 6   TARGET Highest Level of Mobility Mobility Level 2, Turn self in bed/bed activity/dependent transfer     Therapeutic Exercise  Therapeutic Exercise Comments AROM/AAROM/PROM BUE/BLE    Neuromuscular Re-education  Neuromuscular Re-education Comments Seated static/dynamic PC training    Therapeutic Functional Activity  Therapeutic Functional Activity Comments Bed mobility; Pt edu    Clinical Impression  Follow up Assessment Pt seen for PT f/u. Continues to require maxAx1 for bed mobility. Noted excessive R lateral lean seated EOB requiring cues/assist for midline. Tolerates BUE/BLE therex and crossbody reaches well seated EOB; cues for upright posture throughout. Pt edu on positioning for contracture management/prevention. Will benefit from skilled services to improve gross functional ROM/strength, postural control/balance, and activity tolerance. Current PT d/c rec is SNF for LTC.   Criteria for Skilled Therapeutic Interventions Met yes    Patient/Family Stated Goals  Patient/Family Stated Goal(s) return to prior level of functioning;decrease pain;feel better    Frequency/Equipment Recommendations  PT Frequency 2x per week   What day of week is next treatment expected? Monday   7/10  PT/PTA completing this assessment RL   Equipment Needs During Admission/Treatment Hospital bed    PT Recommendations for Inpatient Admission  Activity/Level of Assist dangle edge of bed;assist of 2   ADL Recommendations assist of 2   Positioning chair position of the bed;reposition frequently;elevate heels   Therapeutic Exercise ROM as tolerated   Other/Comments * Pillow between knees in supine for contracture management and prevention of skin breakdown; encourage B knee extension in supine; encourage B podus boots or shorten bed or foam to maintain 0 deg of ankle DF    Planned Treatment / Interventions  Plan for Next Visit Progress as tol   Training Treatment / Interventions Balance / Investment banker, operational;Endurance Training;Functional Personnel officer / Interventions Patient Education / Training    PT Discharge Summary  Physical Therapy Disposition Recommendation SNF for long term placement     Shanon Payor PT, DPTMHB: [phone]

## 2022-06-26 NOTE — Plan of Care
Plan of Care Overview/ Patient Status    1900 - 0700 A&Ox3-4, disoriented to elements of situation vs poor insight. Tearful at start of shift, pt verbalized does not feel sister/brother being supportive nor has her best interests in mind, expressed frustration w/prolonged hospitalization/process and feelings of powerlessness. Later she spoke w/cousin and appeared in better spirits. Emotional support provided. Resting in bed overnight. VSS on RA. Reported moderate/severe pain to L ankle/leg w/activity/positioning. PRN tramadol and other scheduled analgesics continued. Ileostomy intact w/liquid output. T&P'd as tolerated, pt often refuses, education provided. Appeared to rest comfortably overnight. Continues to await placement. Hourly rounding performed and safety maintained. Please continue to follow POC. See flow sheets for complete assessment. Problem: Adult Inpatient Plan of CareGoal: Plan of Care Review7/05/2022 0002 by Jones Skene, RNOutcome: Interventions implemented as appropriateFlowsheets (Taken 06/26/2022 0002)Progress: no changePlan of Care Reviewed With: patient7/05/2022 0002 by Jones Skene, RNOutcome: Interventions implemented as appropriate

## 2022-06-26 NOTE — Progress Notes
[facility]		    [facility]													Internal Medicine Progress NoteInpatient Attending: Gaspar Bidding, MDHospital Day: 135Interval Events NAEO- no clinical change today - awaiting LTC placement- ordered AM labs - worked with PTMeds: Scheduled Meds:Current Facility-Administered Medications Medication Dose Route Frequency Provider Last Rate Last Admin ? Banatrol Plus oral powder packet  1 packet Oral TID Jone Baseman, MD   1 packet at 06/17/22 2143 ? camphor-menthoL (SARNA) lotion   Topical (Top) BID Manzon, Berkley Harvey, MD   Given at 06/26/22 0845 ? diclofenac (VOLTAREN) 1 % gel 4 g  4 g Topical (Top) 4x Daily Larna Daughters, MD   4 g at 06/26/22 0845 ? enoxaparin (LOVENOX) injection 80 mg  80 mg Subcutaneous Q12H Abid, Bushra, MD   80 mg at 06/26/22 0842 ? famotidine (PEPCID) tablet 20 mg  20 mg Oral BID Pryor Ochoa, MD   20 mg at 06/26/22 0843 ? ferrous gluconate (FERGON) tablet 324 mg  324 mg Oral Every Other Day Larna Daughters, MD   324 mg at 06/25/22 0841 ? folic acid (FOLVITE) tablet 1 mg  1 mg Oral Daily Pryor Ochoa, MD   1 mg at 06/26/22 2542 ? gabapentin (NEURONTIN) capsule 100 mg  100 mg Oral TID Marlon Pel, MD   100 mg at 06/26/22 1309 ? lidocaine (LIDODERM) 5 % 1 patch  1 patch Transdermal Q24H Jerral Ralph, MD   1 patch at 06/26/22 1628 ? melatonin tablet 3 mg  3 mg Oral Nightly Pryor Ochoa, MD   3 mg at 06/25/22 2041 ? niacin Immediate Release tablet 100 mg  100 mg Oral Nightly Pryor Ochoa, MD   100 mg at 06/25/22 2041 ? nystatin (MYCOSTATIN) powder 1 Application  1 Application  Topical (Top) BID Henriette Combs, MD   1 Application  at 06/25/22 2043 ? predniSONE (DELTASONE) tablet 5 mg  5 mg Oral Daily Jerral Ralph, MD   5 mg at 06/26/22 7062 ? pyridoxine (vitamin B6) (B-6) tablet 100 mg  100 mg Oral Daily Pryor Ochoa, MD   100 mg at 06/26/22 0843 ? rosuvastatin (CRESTOR) tablet 10 mg  10 mg Per G Tube Nightly Dorisann Frames, MD   10 mg at 06/25/22 2041 ? sodium chloride 0.9 % flush 3 mL  3 mL IV Push Q8H Dorisann Frames, MD   3 mL at 05/26/22 3762 ? thiamine (VITAMIN B1) tablet 100 mg  100 mg Oral BID Pryor Ochoa, MD   100 mg at 06/26/22 0843 Continuous Infusions:PRN Meds: ? acetaminophen  975 mg Oral Q6H PRN ? dextrose (GLUCOSE) oral liquid 15 g  15 g Oral Q15 MIN PRN  Or ? fruit juice  120 mL Oral Q15 MIN PRN  Or ? skim milk  240 mL Oral Q15 MIN PRN ? dextrose (GLUCOSE) oral liquid 30 g  30 g Oral Q15 MIN PRN  Or ? fruit juice  240 mL Oral Q15 MIN PRN ? dextrose injection  12.5 g Intravenous Q15 MIN PRN ? dextrose injection  25 g Intravenous Q15 MIN PRN ? gabapentin  100 mg Oral BID PRN ? glucagon  1 mg Intramuscular Once PRN ? lidocaine   Topical (Top) BID PRN ? methyl salicylate-menthol   Topical (Top) BID PRN ? ondansetron  4 mg Oral Q6H PRN  Or ? ondansetron (ZOFRAN) IV Push  4 mg IV Push Q6H PRN ? polyethylene glycol  17 g Oral DAILY PRN ? sodium chloride  3 mL IV Push PRN for Line Care ?  traMADoL  25 mg Oral Q12H PRN Subjective: Overall feeling wellNo major complain of painGood PO intakeObjective: Vitals:Temp:  [97.6 ?F (36.4 ?C)-98.6 ?F (37 ?C)] 98.6 ?F (37 ?C)Pulse:  [56-72] 72Resp:  [16-18] 16BP: (94-106)/(64-74) 94/64SpO2:  [94 %-96 %] 96 % I/O's:Intake/Output Summary (Last 24 hours) at 06/26/2022 1806Last data filed at 06/26/2022 0014Gross per 24 hour Intake 257 ml Output 450 ml Net -193 ml  Physical Exam:Gen: Well appearing, no acute distressPulm: Breathing comfortably on room air. Abd: soft, nontender, ostomy in place.Ext: Warm, well-perfused, contracted left foot/ankle with intact passive ROM but weakness in extension and eversionNeuro: Alert and oriented x3, responds to questions appropriately, no focal deficits appreciated. Labs: CBCRecent Labs Lab 07/02/230523 WBC 6.0 HGB 11.7 HCT 39.40 PLT 336 MCV 91.0  No results for input(s): NEUTROPHILS, LABLYMP, LABEOS, BANDSP in the last 168 hours. ChemistryRecent Labs Lab 07/02/230523 NA 143 K 3.6 CL 111* CO2 22 BUN 12 CREATININE 0.33* GLU 82 ANIONGAP 10  Recent Labs Lab 07/02/230523 CALCIUM 10.5* - 10.5*  LFTsNo results for input(s): ALT, AST, ALKPHOS, BILITOT, BILIDIR in the last 168 hours. CoagsNo results for input(s): PTT, LABPROT, INR, FIBRINOGEN, DDIMER in the last 168 hours. 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 In Dec 2022, she was found to have abdominal abscess and had G tube placed for nutrition after Ethics was involved, and a STR facility was found in [place]. She was brought back to [facility] Feb 11 2022 due to ongoing encephalopathy and failure to thrive by sister, and the request was made for long term care by family. ?She has had extensive work up for encephalopathy, including MRI brain, L spine, paraneoplastic work up. No organic etiology identified. The decision has been made to await title 19 and long term placement with family. She has been approved for medicaid and is in the process of finding long term facility. #HypercalcemiaLast Ca of 10.5 [Alb 2.4]Last PTH check inappropriately normalDoes not appear to be symptomatic at this time. .- F/U [Ca] level?#immobility, contractures - concern for developing contracture of left ankle/foot; patient declines PODUS boots and cannot tolerate resting on foam wedge despite many reminders that this will prevent contractures. Concern she will likely remain bed bound given prolonged course and the fact she is only getting PT 3x per week while inpatient awaiting placement. 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

## 2022-06-27 LAB — CBC WITH AUTO DIFFERENTIAL
BKR WAM ABSOLUTE IMMATURE GRANULOCYTES.: 0.01 x 1000/ÂµL (ref 0.00–0.30)
BKR WAM ABSOLUTE LYMPHOCYTE COUNT.: 1.71 x 1000/ÂµL (ref 0.60–3.70)
BKR WAM ABSOLUTE NRBC (2 DEC): 0 x 1000/ÂµL (ref 0.00–1.00)
BKR WAM ANALYZER ANC: 4.61 x 1000/ÂµL (ref 2.00–7.60)
BKR WAM BASOPHIL ABSOLUTE COUNT.: 0.04 x 1000/ÂµL (ref 0.00–1.00)
BKR WAM BASOPHILS: 0.5 % (ref 0.0–1.4)
BKR WAM EOSINOPHIL ABSOLUTE COUNT.: 0.47 x 1000/ÂµL (ref 0.00–1.00)
BKR WAM EOSINOPHILS: 6.1 % — ABNORMAL HIGH (ref 0.0–5.0)
BKR WAM HEMATOCRIT (2 DEC): 36.8 % (ref 35.00–45.00)
BKR WAM HEMOGLOBIN: 11.4 g/dL — ABNORMAL LOW (ref 11.7–15.5)
BKR WAM IMMATURE GRANULOCYTES: 0.1 % (ref 0.0–1.0)
BKR WAM LYMPHOCYTES: 22.3 % (ref 17.0–50.0)
BKR WAM MCH (PG): 27.1 pg (ref 27.0–33.0)
BKR WAM MCHC: 31 g/dL (ref 31.0–36.0)
BKR WAM MCV: 87.6 fL (ref 80.0–100.0)
BKR WAM MONOCYTE ABSOLUTE COUNT.: 0.82 x 1000/ÂµL (ref 0.00–1.00)
BKR WAM MONOCYTES: 10.7 % (ref 4.0–12.0)
BKR WAM MPV: 10.2 fL (ref 8.0–12.0)
BKR WAM NEUTROPHILS: 60.3 % (ref 39.0–72.0)
BKR WAM NUCLEATED RED BLOOD CELLS: 0 % (ref 0.0–1.0)
BKR WAM PLATELETS: 278 x1000/ÂµL (ref 150–420)
BKR WAM RDW-CV: 17.2 % — ABNORMAL HIGH (ref 11.0–15.0)
BKR WAM RED BLOOD CELL COUNT.: 4.2 M/ÂµL (ref 4.00–6.00)
BKR WAM WHITE BLOOD CELL COUNT: 7.7 x1000/ÂµL (ref 4.0–11.0)

## 2022-06-27 LAB — BASIC METABOLIC PANEL
BKR ANION GAP: 9 (ref 7–17)
BKR BLOOD UREA NITROGEN: 11 mg/dL (ref 8–23)
BKR BUN / CREAT RATIO: 29.7 — ABNORMAL HIGH (ref 8.0–23.0)
BKR CALCIUM: 10.5 mg/dL — ABNORMAL HIGH (ref 8.8–10.2)
BKR CHLORIDE: 108 mmol/L — ABNORMAL HIGH (ref 98–107)
BKR CO2: 22 mmol/L (ref 20–30)
BKR CREATININE: 0.37 mg/dL — ABNORMAL LOW (ref 0.40–1.30)
BKR EGFR, CREATININE (CKD-EPI 2021): >60 mL/min/{1.73_m2} (ref >=60)
BKR GLUCOSE: 94 mg/dL (ref 70–100)
BKR POTASSIUM: 3.8 mmol/L (ref 3.3–5.3)
BKR SODIUM: 139 mmol/L (ref 136–144)

## 2022-06-27 LAB — MAGNESIUM: BKR MAGNESIUM: 1.9 mg/dL (ref 1.7–2.4)

## 2022-06-27 NOTE — Plan of Care
Danielle [name]				Location: EP [id number] y.o., female				Attending: Gaspar Bidding, MD	Admit Date: 02/11/2022			[name] LOS: 136 days FOLLOW-UP NUTRITION ASSESSMENTDIET ORDER: Regular Nutrition Supplements: Ensure Clear Berry TID (vary flavors for pt)ANTHROPOMETRICS:Height: 70Admit wt: 79.4 kg (per SNF documentation)Current wt: 73.5 kg (bed, 7/4); BMI: 23.25 kg/m^2Dosing weight: 73.5 kg (based on current wt)Additional wt information:Ht confirmed with pt. No wt hx since 2019 per EMR. Wts have fluctuated since admit, likely r/t scale use vs fluid shifts vs some true wt change. Current wt down 5.9 kg (7.4%) from admit wt x4.5 months, not significant for timeframe. Noted wt up 1.1 kg over the past week. 1-2+ edema noted per RN flowsheets. No muscle/fat wasting observed. Will continue to trend wts as available.Wt: 06/24/22 73.5 kg 12/13/18 98.6 kg ESTIMATED NUTRITION REQUIREMENTS:Kcal/day: 1838	(25 kcal/kg)Protein/day: 74-88	(1-1.2 gm/kg)Fluids/day: 1838?mL/d?(1 mL/kcal) ONLY as medical and fluid status allows and only per team discretion.Needs based on: dosing wt (73.5 kg), maintenance, steroidNUTRITION ASSESSMENT: Chart reviewed for f/u and kcal count review. Pt remains medically stable, discharge pending LTC placement. Labs and meds reviewed. Lytes WNL, noted phos not updated. BG 94 over the past 24 hrs. Kcal count completed per I/Os, see results below. Noted PO intake over the past week inconsistent per I/Os, suspect some lack of documentation. Visited pt this AM. Pt reports fair appetite, consuming average of ~75% of 3 small meals/day but this sometimes varies. C/o lack of desirable food choices from kitchen. Denies receiving extra snacks from team or outside of the hospital over the past week. Consuming Ensure Clear Berry x1-2/day. Denies N/V. 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 Pt endorses recently loose output but improvement over the past few days. Discussed foods to help to thicken output as needed. Pt falling short of estimated needs per kcal count; however, suspect pt is consuming more than documented per I/Os. Pt endorses falling short of needs, reports that consuming >1600 kcal/day is not realistic for her. Pt would benefit from continued encouragement at mealtimes and increased supplement use to avoid need to restart nutrition support. Encouraged pt to improve kcal/protein intake (with examples) and continue supplement use. Nutrition will continue to follow.Kcal count results per I/Os:6/30: 208 kcal, 5 gm protein +1 Ensure7/1: 465 kcal, 10 gm protein +1 Ensure7/2: 739 kcal, 25 gm protein +1 Ensure7/3: 323 kcal, 8 gm protein +1 Ensure7/4: 273 kcal, 5 gm protein +2 Ensure7/5: 742 kcal, 30 gm protein +2 Ensure7/6: 342 kcal, 16 gm protein + 1 EnsureDaily average (incl. supplements): 751 kcal, 24 gm protein -> meets ~41% of kcal and 32% of protein needsCultural/Religious/Ethnic Needs: NoFood Allergies: None per ptNUTRITION DIAGNOSIS:Severe malnutrition r/t depletion aeb moderate muscle and fat depletion. -continues with PO intake not meeting estimated needs but noted wt up over the past week, will re-evaluate at next visit and hope to d/cINTERVENTIONS/RECOMMENDATIONS: Meals and Snacks:-Continue Regular diet- Encourage small frequent meals/snacks- Encourage high kcal/protein foods- May continue kcal counts per team, will reassess at f/u if applicableGoal: Pt will meet >60% of estimated needs to d/c enteral nutrition -unsure if met as pt is meeting <60% of needs per kcal count but suspect actual intake is greater than documented, continuesNew Goal: Pt will consume >/=1 protein rich food with each mealNutrition Supplement Therapy:- Change Ensure Clear Berry to BID per pt request	- Remove note to vary flavors for pt d/t pt disliking [employer] flavor- Will add Magic Cup Vanilla 1x daily - Will add [employer] Jello Cherry 1x dailyGoal: Pt to consume 2-3 Ensure Clear daily -sometimes met,  will change to 2-3 ONS/dayEnteral Nutrition:- Will continue to assess need to restart pending improved PO intakeNutrition Education:- Discussed food sources of protein- Discussed foods to help thicken ostomy outputDischarge Planning and Transfer of Nutrition Care:  Nutrition related discharge needs still being determined at this time, will continue to followMONITORING / EVALUATION:Food/Nutrition-Related History:Food and Nutrient IntakeAnthropometric Measurements:Weight Biochemical Data, Medical Tests and Procedures:Electrolyte and renal profileGlucose/endocrine profileNutrition-Focus Physical FindingsOverall findingsElectronically signed by Trudie Buckler, July 7, 2023Please note: Nutrition is a consult only service on weekends and holidays.  Please enter a consult in EPIC if assistance is needed on a weekend or holiday or via [facility] [phone] to contact the covering RD.

## 2022-06-27 NOTE — Progress Notes
[facility] - General Medicine Service                                 Inpatient Daily Progress Note Patient: Danielle [name], a 66 y.o. 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 Denies any pain/discomfort. Tolerating PO.- Pain is not controlled on current medications.- Ambulating with help.- Voiding spontaneously, passed flatus, no dysfunction.- Discharge plan pending T-19 approvalObjective Current Medications:MEDS  Scheduled Meds:banana flakes-t-galactooligos., 1 packet, Oral, TIDcamphor-menthoL, , Topical (Top), BIDdiclofenac, 4 g, Topical (Top), 4x Dailyenoxaparin (LOVENOX) treatment, 80 mg, Subcutaneous, Q12Hfamotidine, 20 mg, Oral, BIDferrous gluconate, 324 mg, Oral, Every Other Dayfolic acid, 1 mg, Oral, Dailygabapentin, 100 mg, Oral, TIDLidocaine Patch, 1 patch, Transdermal, Q24Hmelatonin, 3 mg, Oral, Nightlyniacin, 100 mg, Oral, Nightlynystatin, 1 Application , Topical (Top), BIDpredniSONE, 5 mg, Oral, Dailypyridoxine (vitamin B6), 100 mg, Oral, Dailyrosuvastatin, 10 mg, Per G Tube, Nightlysodium chloride, 3 mL, IV Push, Q8Hthiamine, 100 mg, Oral, BID PRN Meds:acetaminophen, 975 mg, Q6H PRNdextrose (GLUCOSE) oral liquid 15 g, 15 g, Q15 MIN PRN Orfruit juice, 120 mL, Q15 MIN PRN [place]im milk, 240 mL, Q15 MIN PRNdextrose (GLUCOSE) oral liquid 30 g, 30 g, Q15 MIN PRN Orfruit juice, 240 mL, Q15 MIN PRNdextrose injection, 12.5 g, Q15 MIN PRNdextrose injection, 25 g, Q15 MIN PRNgabapentin, 100 mg, BID PRNglucagon, 1 mg, Once PRNlidocaine, , BID PRNmethyl salicylate-menthol, , BID PRNondansetron, 4 mg, Q6H PRN Orondansetron (ZOFRAN) IV Push, 4 mg, Q6H PRNpolyethylene glycol, 17 g, DAILY PRNsodium chloride, 3 mL, PRN for Line CaretraMADoL, 25 mg, Q12H PRN Allergies:has No Known Allergies.Vitals (Last 24 Hrs):Temp:  [98 ?F (36.7 ?C)-99.1 ?F (37.3 ?C)] 98 ?F (36.7 ?C)Pulse:  [60-68] 60Resp:  [16-18] 18BP: (91-103)/(60-67) 91/66SpO2:  [95 %-97 %] 97 %Device (Oxygen Therapy): room air Wt Readings from Last 3 Encounters: 06/24/22 73.5 kg 12/13/18 98.6 kg  I/O's (Last 24 Hrs):Gross Totals (Last 24 hours) at 06/27/2022 1537Last data filed at 06/27/2022 0630Intake -- Output 450 ml Net -450 ml  Physical Exam:Gen: Well appearing, no acute distressPulm: Breathing comfortably on room air. Abd: soft, nontender, ostomy in place.Ext: Warm, well-perfused, contracted left foot/ankle with intact passive ROM but weakness in extension and eversionNeuro: Alert and oriented x3, responds to questions appropriately, no focal deficits appreciated. ?Diagnostic Studies CBC/BMP: Recent Labs Lab 07/19/522 07/07/230643 07/07/230644 WBC 6.0  --  7.7 HGB 11.7  --  11.4* PLT 336  --  278 NA 143 139  --  K 3.6 3.8  --  CO2 22 22  --  ANIONGAP 10 9  --  BUN 12 11  --  CREATININE 0.33* 0.37*  --  Lytes, Coags, LFTs: Recent Labs Lab Jul 29, 230523 07/07/230643 CALCIUM 10.5* - 10.5* 10.5* MG  --  1.9 Glucose Trend: Recent Labs Lab Jul 29, 230523 07/07/230643 GLU 82 94 Troponins: No results for input(s): TROPONINT in the last 168 hours.pHart: No results for input(s): PHART, PCO2ART, PO2ART, HCO3ART, O2SATART, LITERFLOW in the  last 168 hours.COVID Monitoring: Lab Results Component Value Date  SARSCOV2 Negative 02/23/2022  HSCRP 12.0 (H) 05/28/2022  PROCALCITON 2.08 (H) 04/08/2022  FIBRINOGEN 386 12/13/2018  CKTOTAL 39 05/11/2022 Liver Seriologies:	- Virology: HAV IgG/IgM, HBSAg, HBSAb, HBCAb, HCV Ab, HIV	- Metabolic: A1AT, ceruloplasmin, iron studies with ferritin, TSH	- Autoimmune: ANA, ASMA, Immunoglobulin G, anti-LKM, anti-TTG      Microbio Date Status Species/Sensitivity Blood - - - Urine - - - Sputum - - - Others - - - Lab Results Component Value Date  LABBLOO No Growth after 5 days of incubation 04/05/2022  LABBLOO No Growth after 5 days of incubation 04/05/2022  LABBLOO No Growth after 5 days of incubation 04/02/2022  LABBLOO No Growth after 5 days of incubation 04/02/2022  LABBLOO No Growth after 5 days of incubation 02/12/2022  LABBLOO No Growth after 5 days of incubation 02/12/2022  Imaging/Monitors  RadiologyXR Ankle Left AP Lateral and Oblique Final Result   No acute osseous abnormality.      [facility] Radiology Notify System:  Routine  Reported And Signed By: Ricci Barker, MD    [facility] Radiology and Biomedical Imaging   US Duplex Lower Extremity Venous Bilat Final Result    Nonocclusive acute deep venous thrombus is noted within the right popliteal vein and tibioperoneal trunk.  [place] Radiology Notify System Classification: Orange - Urgent without Follow-up  Reported And Signed By: Verl Bangs, MD    [facility] Radiology and Biomedical Imaging   XR Knee Bilateral AP and Lateral ([facility] LM) Final Result   Degenerative changes, as above.   [facility] Radiology Notify System: Routine  Reported And Signed By: Ricci Barker, MD    [facility] Radiology and Biomedical Imaging   XR Foot Right AP Lateral and Oblique Final Result   No acute osseous abnormality.   Please note evaluation is markedly limited due to osseous demineralization .   [facility] Radiology Notify System:  Routine  Reported And Signed By: Ricci Barker, MD    [facility] Radiology and Biomedical Imaging   US Duplex Lower Extremity Venous Bilat Final Result    No evidence of deep venous thrombosis in bilateral femoral/popliteal veins.  Please note that the left upper profunda femoral vein and bilateral calf veins were not visualized. If there is continued clinical concern, re-evaluation can be performed after 5-7 days to evaluate for propagation of an unseen thrombus from the calf.  [place] Radiology Notify System Classification: Routine  Reported And Signed By: Verl Bangs, MD    [facility] Radiology and Biomedical Imaging   [place] Abdomen Pelvis w IV Contrast Final Result  1. No evidence of abscess and no other acute [place] findings in the abdomen pelvis. 2. Persistent and similar possibly slightly decreased peritoneal nodularity.  Report Initiated By:  Idalia Needle, MD  Reported And Signed By: Glo Herring, MD    [facility] Radiology and Biomedical Imaging   XR Chest PA or AP Final Result   Mild pulmonary interstitial edema. No focal airspace consolidation.  Reported And Signed By: Cori Razor, MD    [facility] Radiology and Biomedical Imaging   XR Abdomen AP Final Result  XR Abdomen AP Final Result  IR Gastrointestinal procedures Final Result Successful gastrostomy replacement using a MIC 24 French gastrostomy tube. The tube is ready for immediate use. The tube should be flushed twice daily with 15 cc Tap Water.  Report Initiated By:  Thana Farr, MD  Reported And Signed By: Brunetta Jeans, MD    [facility] Radiology and Biomedical Imaging  XR Chest PA or AP (Portable) Final Result    No consolidations to suggest pneumonia.  Reported And Signed By: Leilani Merl, MD    [facility] Radiology and Biomedical Imaging   XR Foot Bilateral AP Lateral and Oblique Final Result  1. Diffuse degenerative changes without evidence of acute osseous injury. There is no evidence of bony erosion.  Reported And Signed By: Merdis Delay, MD    [facility] Radiology and Biomedical Imaging   MRI Brain w wo IV Contrast Final Result  1. 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 2. Redemonstration microvascular disease no new [place] restricted diffusion hemorrhage or midline shift. 3.  Motion limits assessment of the heterogeneous maxillary alveolus correlate with dental inspection Report Initiated By:  Neta Mends, MD  Reported And Signed By: Lynett Fish, MD    [facility] Radiology and Biomedical Imaging   MRI Total Spine w wo IV Contrast Final Result  1. Scoliotic deformity of the thoracolumbar spine. 2. 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 3. No obvious epidural abscess or paraspinal collection. No findings suspicious for discitis/osteomyelitis. Mild perifacet inflammatory changes identified at L4-5, likely degenerative. Mild increased T2 signal and enhancement within the posterior paraspinal muscles diffusely throughout the lumbar spine could be related to denervation or inflammation.  Examination is significantly degraded by patient motion and sagittal T2 signal changes within the spinal cord or abnormal intrathecal enhancement may not be apparent on this examination. Examination to be repeated with sedation if clinically indicated.  Report Initiated By:  Neta Mends, MD  Reported And Signed By: Aviva Kluver, MD    [facility] Radiology and Biomedical Imaging   XR Hips Bilateral AP and Lateral with AP Pelvis Final Result  US Abdomen Pelvis Vascular Study Complete ([facility] LM) Final Result    No evidence of portal vein or hepatic vein thrombosis.  Increased liver echogenicity, which is most commonly noted with hepatic steatosis. However, please note that more advanced forms of liver disease, including hepatic fibrosis/early cirrhosis may have a similar imaging appearance.   [place] Radiology Notify System Classification: Routine  Report Initiated By:  Kreg Shropshire, MD  Reported And Signed By: Verl Bangs, MD    [facility] Radiology and Biomedical Imaging   Hospitalist Procedure [facility]) Final Result  Hospitalist Procedure [facility]) Final Result  [place] wo IV Contrast Final Result Since the prior exam, there has been a significant increase in ventricular size and sylvian fissures. In the appropriate clinical setting normal pressure hydrocephalus can be considered.  Report Initiated By:  Consuela Mimes, MD  Reported And Signed By: Cherre Blanc, MD    [facility] Radiology and Biomedical Imaging   [place] Chest wo IV Contrast Final Result  Patchy groundglass opacities in bilateral lower lobes with 4 mm right lower lobe lung nodule, findings may represent infectious/inflammatory etiology in a proper clinical setting. Short-term follow-up can be obtained for resolution. Otherwise consider follow-up chest Brady in 12 months for stability.  Reported And Signed By: Antonieta Loveless, MD    [facility] Radiology and Biomedical Imaging   [place] Abdomen Pelvis w IV Contrast Final Result No collection in abdomen or pelvis.   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 Correlation with prior imaging is recommended.    Reported And Signed By: Madie Reno, MD    [facility] Radiology and Biomedical Imaging CXR (portable) Final Result   No acute cardiothoracic abnormality.  Report Initiated By:  Tessie Eke, RRA  Reported And Signed By: Alberteen Sam, MD    [facility] Radiology and Biomedical Imaging   [place] RESULT SCAN Final Result  [place] RESULT SCAN Final Result  Outside Interpretation [place] Abdomen and/or Pelvis Final Result  [place] RESULT SCAN Final Result  XRAY RESULT SCAN Final Result  Outside Interpretation MRI Neuro Final Result   Only motion degraded T1 images are available for review which significantly limits evaluation. Mild ventriculomegaly. No focal mass or intracranial hemorrhage is identified.    Report Initiated By:  Renato Gails, MD  Reported And Signed By: Vicki Mallet, MD    [facility] Radiology and Biomedical Imaging   MRI RESULT SCAN Final Result  Outside Interpretation MRI Neuro Final Result Multilevel degenerative changes as described above. Report Initiated By:  Renato Gails, MD  Reported And Signed By: Vicki Mallet, MD    [facility] Radiology and Biomedical Imaging   Outside Interpretation MRI Neuro Final Result   Apparent multiple foci of T2 hyperintensity in the thoracic cord throughout its length including at the level of the conus in the setting of motion artifact. It is unclear if these are artifactual or relate to lesions within the cord. Repeat T2 axial and T2 sagittal images of the thoracic spine are recommended.   Levoscoliotic curvature of the thoracic spine with no significant spinal canal stenosis or neural foraminal narrowing.   Report Initiated By:  Renato Gails, MD  Reported And Signed By: Vicki Mallet, MD    [facility] Radiology and Biomedical Imaging   MRI RESULT SCAN Final Result  [facility] MRI Cervical Spine Final Result  Outside Interpretation [place] Abdomen and/or Pelvis Final Result  [place] RESULT SCAN Final Result  Outside Interpretation [place] Abdomen and/or Pelvis Final Result   RESULT SCAN Final Result  ED Procedural [place] Peripheral Venous Access    (Results Pending)   CardiacEKG:Results for orders placed or performed during the hospital encounter of 02/11/22 EKG Result Value Ref Range  Heart Rate 108 bpm  QRS Interval 79 ms  QT Interval 332 ms  QTC Interval 445 ms  P Axis 43 deg  QRS Axis -11 deg  T Wave Axis 52 deg  P-R Interval 124 msec  SEVERITY Abnormal ECG severity TTE:No results found for this or any previous visit. 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 In Dec 2022, she was found to have abdominal abscess and had G tube placed for nutrition after Ethics was involved, and a STR facility was found in [place]. She was brought back to [facility] Feb 11 2022 due to ongoing encephalopathy and failure to thrive by sister, and the request was made for long term care by family. ?She has had extensive work up for encephalopathy, including MRI brain, L spine, paraneoplastic work up. No organic etiology identified. The decision has been made to await title 19 and long term placement with family. She has been approved for medicaid and is in the process of finding long term facility. ?#HypercalcemiaLast Ca of 10.5 [Alb 2.4]Last PTH check inappropriately normalDoes not appear to be symptomatic at this time. .- F/U [Ca] level?#immobility, contractures?- concern for developing contracture of left ankle/foot; patient declines PODUS boots and cannot tolerate resting on foam wedge despite many reminders that this will prevent contractures. Concern she will likely remain bed bound given prolonged course and the fact she is  only getting PT 3x per week while inpatient awaiting placement. - Encouraged more frequent PT (x3 per week)#DispositionPending T-19 approval. Her sister [name]) is her gaurdian, but she has written letter to probate court to revise her guardianship. 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

## 2022-06-27 NOTE — Plan of Care
Plan of Care Overview/ Patient Status    1900-0700: A/o x4, forgetful, VSS on room air. C/o b/l foot pain, prn tramadol, tyelnol, & gabapentin  given with positive effect. Denies SOB/chest pain. ostomy in place, CDI, purewick in place, incontinence care provided as needed. B/l lower extremity edema present, heels offloaded, please see flowsheet for full skin assessment. Pills whole. Assist x 2 in bed. T & R Q2 hours. Hourly rounding completed, safety maintained. Call bell within reach

## 2022-06-27 NOTE — Plan of Care
Plan of Care Overview/ Patient Status    0730-1900: A&OX3, forgetful to situation. VSS on room air. Lungs clear. Fair PO intake, see flowsheets for calorie count Incontinent of urine, purewick in place. Ostomy bag intact, +stool/flatus. Turned & repositioned in bed q2 hours. Specialty bed in place. Heels elevated off bed on pillows. Report given to on coming RN.Problem: Adult Inpatient Plan of CareGoal: Plan of Care ReviewOutcome: Interventions implemented as appropriateFlowsheets (Taken 06/27/2022 1318)Progress: no changePlan of Care Reviewed With: patient Problem: Adult Inpatient Plan of CareGoal: Patient-Specific Goal (Individualized)Outcome: Interventions implemented as appropriateFlowsheets (Taken 06/27/2022 0814)What Anxieties, Fears, Concerns or Questions Do You Have About Your Care?: none statedIndividualized Preferences and Care Needs: updates on pocPatient Centered Long Term Goal: safe d/cPatient Centered Daily Goal: pain control, good PO intake

## 2022-06-28 LAB — CALCIUM, URINE, RANDOM     (BH GH L LMW YH): BKR CALCIUM, URINE, RANDOM: 63.8 mg/dL

## 2022-06-28 NOTE — Progress Notes
[facility] - General Medicine Service                                 Inpatient Daily Progress Note Patient: Danielle [name], a 66 y.o. 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 Denies any pain/discomfort. Tolerating PO.- Ambulating with help.- Discharge plan pending T-19 approvalObjective Current Medications:MEDS  Scheduled Meds:banana flakes-t-galactooligos., 1 packet, Oral, TIDcamphor-menthoL, , Topical (Top), BIDdiclofenac, 4 g, Topical (Top), 4x Dailyenoxaparin (LOVENOX) treatment, 80 mg, Subcutaneous, Q12Hfamotidine, 20 mg, Oral, BIDferrous gluconate, 324 mg, Oral, Every Other Dayfolic acid, 1 mg, Oral, Dailygabapentin, 100 mg, Oral, TIDLidocaine Patch, 1 patch, Transdermal, Q24Hmelatonin, 3 mg, Oral, Nightlyniacin, 100 mg, Oral, Nightlynystatin, 1 Application , Topical (Top), BIDpredniSONE, 5 mg, Oral, Dailypyridoxine (vitamin B6), 100 mg, Oral, Dailyrosuvastatin, 10 mg, Per G Tube, Nightlysodium chloride, 3 mL, IV Push, Q8Hthiamine, 100 mg, Oral, BID PRN Meds:acetaminophen, 975 mg, Q6H PRNdextrose (GLUCOSE) oral liquid 15 g, 15 g, Q15 MIN PRN Orfruit juice, 120 mL, Q15 MIN PRN [place]im milk, 240 mL, Q15 MIN PRNdextrose (GLUCOSE) oral liquid 30 g, 30 g, Q15 MIN PRN Orfruit juice, 240 mL, Q15 MIN PRNdextrose injection, 12.5 g, Q15 MIN PRNdextrose injection, 25 g, Q15 MIN PRNgabapentin, 100 mg, BID PRNglucagon, 1 mg, Once PRNlidocaine, , BID PRNmethyl salicylate-menthol, , BID PRNondansetron, 4 mg, Q6H PRN Orondansetron (ZOFRAN) IV Push, 4 mg, Q6H PRNpolyethylene glycol, 17 g, DAILY PRNsodium chloride, 3 mL, PRN for Line CaretraMADoL, 25 mg, Q12H PRN Allergies:has No Known Allergies.Vitals (Last 24 Hrs):Temp:  [98 ?F (36.7 ?C)-98.5 ?F (36.9 ?C)] 98.5 ?F (36.9 ?C)Pulse:  [59-77] 77Resp:  [18-20] 20BP: (91-110)/(64-73) 104/64SpO2:  [97 %-99 %] 98 %Device (Oxygen Therapy): room air Wt Readings from Last 3 Encounters: 06/24/22 73.5 kg 12/13/18 98.6 kg  I/O's (Last 24 Hrs):Gross Totals (Last 24 hours) at 06/28/2022 1012Last data filed at 06/27/2022 2101Intake -- Output 400 ml Net -400 ml  Physical Exam:Gen: Well appearing, no acute distressPulm: Breathing comfortably on room air. Abd: soft, nontender, ostomy in place.Ext: Warm, well-perfused, contracted left foot/ankle with intact passive ROM but weakness in extension and eversionNeuro: Alert and oriented x3, responds to questions appropriately, no focal deficits appreciated. ?Diagnostic Studies CBC/BMP: Recent Labs Lab July 18, [id number] 07/07/230643 07/07/230644 WBC 6.0  --  7.7 HGB 11.7  --  11.4* PLT 336  --  278 NA 143 139  --  K 3.6 3.8  --  CO2 22 22  --  ANIONGAP 10 9  --  BUN 12 11  --  CREATININE 0.33* 0.37*  --  Lytes, Coags, LFTs: Recent Labs Lab July 18, 230523 07/07/230643 CALCIUM 10.5* - 10.5* 10.5* MG  --  1.9 Glucose Trend: Recent Labs Lab 18-Jul-230523 07/07/230643 GLU 82 94 Troponins: No results for input(s): TROPONINT in the last 168 hours.pHart: No results for input(s): PHART, PCO2ART, PO2ART, HCO3ART, O2SATART, LITERFLOW in the last 168 hours.COVID Monitoring: Lab Results Component Value Date  SARSCOV2 Negative 02/23/2022  HSCRP 12.0 (H) 05/28/2022  PROCALCITON 2.08 (H) 04/08/2022  FIBRINOGEN 386 12/13/2018  CKTOTAL 39 05/11/2022 Liver Seriologies:	- Virology: HAV IgG/IgM, HBSAg, HBSAb, HBCAb, HCV Ab, HIV	- Metabolic: A1AT, ceruloplasmin, iron studies with ferritin, TSH	- Autoimmune: ANA, ASMA, Immunoglobulin G, anti-LKM, anti-TTG      Microbio Date Status Species/Sensitivity Blood - - - Urine - - - Sputum - - - Others - - - Lab Results Component Value Date  LABBLOO No Growth after 5 days of incubation 04/05/2022  LABBLOO No Growth after 5 days of incubation 04/05/2022  LABBLOO No Growth after 5 days of incubation 04/02/2022  LABBLOO No Growth after 5 days of incubation 04/02/2022  LABBLOO No Growth after 5 days of incubation 02/12/2022  LABBLOO No Growth after 5 days of incubation 02/12/2022  Imaging/Monitors  RadiologyXR Ankle Left AP Lateral and Oblique Final Result   No acute osseous abnormality.      [facility] Radiology Notify System:  Routine  Reported And Signed By: Ricci Barker, MD    [facility] Radiology and Biomedical Imaging   US Duplex Lower Extremity Venous Bilat Final Result    Nonocclusive acute deep venous thrombus is noted within the right popliteal vein and tibioperoneal trunk.  [place] Radiology Notify System Classification: [place] - Urgent without Follow-up  Reported And Signed By: Verl Bangs, MD    [facility] Radiology and Biomedical Imaging   XR Knee Bilateral AP and Lateral ([facility] Y[facility] LM) Final Result   Degenerative changes, as above.   [facility] Radiology Notify System:  Routine  Reported And Signed By: Ricci Barker, MD    [facility] Radiology and Biomedical Imaging   XR Foot Right AP Lateral and Oblique Final Result   No acute osseous abnormality.   Please note evaluation is markedly limited due to osseous demineralization .   [facility] Radiology Notify System:  Routine  Reported And Signed By: Ricci Barker, MD    [facility] Radiology and Biomedical Imaging   US Duplex Lower Extremity Venous Bilat Final Result    No evidence of deep venous thrombosis in bilateral femoral/popliteal veins.  Please note that the left upper profunda femoral vein and bilateral calf veins were not visualized. If there is continued clinical concern, re-evaluation can be performed after 5-7 days to evaluate for propagation of an unseen thrombus from the calf.  [place] Radiology Notify System Classification: Routine  Reported And Signed By: Verl Bangs, MD    [facility] Radiology and Biomedical Imaging   [place] Abdomen Pelvis w IV Contrast Final Result  1. No evidence of abscess and no other acute [place] findings in the abdomen pelvis. 2. Persistent and similar possibly slightly decreased peritoneal nodularity.  Report Initiated By:  Idalia Needle, MD  Reported And Signed By: Glo Herring, MD    [facility] Radiology and Biomedical Imaging   XR Chest PA or AP Final Result   Mild pulmonary interstitial edema. No focal airspace consolidation.  Reported And Signed By: Cori Razor, MD    [facility] Radiology and Biomedical Imaging   XR Abdomen AP Final Result  XR Abdomen AP Final Result  IR Gastrointestinal procedures Final Result Successful gastrostomy replacement using a MIC 24 French gastrostomy tube. The tube is ready for immediate use. The tube should be flushed twice daily with 15 cc Tap Water.  Report Initiated By:  Thana Farr, MD  Reported And Signed By: Brunetta Jeans, MD    [facility] Radiology and Biomedical Imaging   XR Chest PA or AP (Portable) Final Result    No  consolidations to suggest pneumonia.  Reported And Signed By: Leilani Merl, MD    [facility] Radiology and Biomedical Imaging   XR Foot Bilateral AP Lateral and Oblique Final Result  1. Diffuse degenerative changes without evidence of acute osseous injury. There is no evidence of bony erosion.  Reported And Signed By: Merdis Delay, MD    [facility] Radiology and Biomedical Imaging   MRI Brain w wo IV Contrast Final Result  1. 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 2. Redemonstration microvascular disease no new [place] restricted diffusion hemorrhage or midline shift. 3.  Motion limits assessment of the heterogeneous maxillary alveolus correlate with dental inspection Report Initiated By:  Neta Mends, MD  Reported And Signed By: Lynett Fish, MD    [facility] Radiology and Biomedical Imaging   MRI Total Spine w wo IV Contrast Final Result  1. Scoliotic deformity of the thoracolumbar spine. 2. 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 3. No obvious epidural abscess or paraspinal collection. No findings suspicious for discitis/osteomyelitis. Mild perifacet inflammatory changes identified at L4-5, likely degenerative. Mild increased T2 signal and enhancement within the posterior paraspinal muscles diffusely throughout the lumbar spine could be related to denervation or inflammation.  Examination is significantly degraded by patient motion and sagittal T2 signal changes within the spinal cord or abnormal intrathecal enhancement may not be apparent on this examination. Examination to be repeated with sedation if clinically indicated.  Report Initiated By:  Neta Mends, MD  Reported And Signed By: Aviva Kluver, MD    [facility] Radiology and Biomedical Imaging   XR Hips Bilateral AP and Lateral with AP Pelvis Final Result  US Abdomen Pelvis Vascular Study Complete ([facility] BH LM) Final Result    No evidence of portal vein or hepatic vein thrombosis.  Increased liver echogenicity, which is most commonly noted with hepatic steatosis. However, please note that more advanced forms of liver disease, including hepatic fibrosis/early cirrhosis may have a similar imaging appearance.   [place] Radiology Notify System Classification: Routine  Report Initiated By:  Kreg Shropshire, MD  Reported And Signed By: Verl Bangs, MD    [facility] Radiology and Biomedical Imaging   Hospitalist Procedure [facility]) Final Result  Hospitalist Procedure [facility]) Final Result  [place] Head wo IV Contrast Final Result Since the prior exam, there has been a significant increase in ventricular size and sylvian fissures. In the appropriate clinical setting normal pressure hydrocephalus can be considered.  Report Initiated By:  Consuela Mimes, MD  Reported And Signed By: Cherre Blanc, MD    [facility] Radiology and Biomedical Imaging   [place] Chest wo IV Contrast Final Result  Patchy groundglass opacities in bilateral lower lobes with 4 mm right lower lobe lung nodule, findings may represent infectious/inflammatory etiology in a proper clinical setting. Short-term follow-up can be obtained for resolution. Otherwise consider follow-up chest [place] in 12 months for stability.  Reported And Signed By: Antonieta Loveless, MD    [facility] Radiology and Biomedical Imaging   [place] Abdomen Pelvis w IV Contrast Final Result No collection in abdomen or pelvis.   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 Correlation with prior imaging is recommended.    Reported And Signed By: Madie Reno, MD    [facility] Radiology and Biomedical Imaging   CXR (portable) Final Result   No acute cardiothoracic abnormality.  Report Initiated By:  Tessie Eke, RRA  Reported  And Signed By: Alberteen Sam, MD    [facility] Radiology and Biomedical Imaging   [place] RESULT SCAN Final Result  [place] RESULT SCAN Final Result  Outside Interpretation [place] Abdomen and/or Pelvis Final Result  [place] RESULT SCAN Final Result  XRAY RESULT SCAN Final Result  Outside Interpretation MRI Neuro Final Result   Only motion degraded T1 images are available for review which significantly limits evaluation. Mild ventriculomegaly. No focal mass or intracranial hemorrhage is identified.    Report Initiated By:  Renato Gails, MD  Reported And Signed By: Vicki Mallet, MD    [facility] Radiology and Biomedical Imaging   MRI RESULT SCAN Final Result  Outside Interpretation MRI Neuro Final Result Multilevel degenerative changes as described above. Report Initiated By:  Renato Gails, MD  Reported And Signed By: Vicki Mallet, MD    [facility] Radiology and Biomedical Imaging   Outside Interpretation MRI Neuro Final Result   Apparent multiple foci of T2 hyperintensity in the thoracic cord throughout its length including at the level of the conus in the setting of motion artifact. It is unclear if these are artifactual or relate to lesions within the cord. Repeat T2 axial and T2 sagittal images of the thoracic spine are recommended.   Levoscoliotic curvature of the thoracic spine with no significant spinal canal stenosis or neural foraminal narrowing.   Report Initiated By:  Renato Gails, MD  Reported And Signed By: Vicki Mallet, MD    [facility] Radiology and Biomedical Imaging   MRI RESULT SCAN Final Result  [facility] MRI Cervical Spine Final Result  Outside Interpretation [place] Abdomen and/or Pelvis Final Result  [place] RESULT SCAN Final Result  Outside Interpretation [place] Abdomen and/or Pelvis Final Result  [place] RESULT SCAN Final Result  ED Procedural [place] Peripheral Venous Access (Results Pending)   CardiacEKG:Results for orders placed or performed during the hospital encounter of 02/11/22 EKG Result Value Ref Range  Heart Rate 108 bpm  QRS Interval 79 ms  QT Interval 332 ms  QTC Interval 445 ms  P Axis 43 deg  QRS Axis -11 deg  T Wave Axis 52 deg  P-R Interval 124 msec  SEVERITY Abnormal ECG severity TTE:No results found for this or any previous visit. 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 In Dec 2022, she was found to have abdominal abscess and had G tube placed for nutrition after Ethics was involved, and a STR facility was found in [place]. She was brought back to [facility] 2022 due to ongoing encephalopathy and failure to thrive by sister, and the request was made for long term care by family. ?She has had extensive work up for encephalopathy, including MRI brain, L spine, paraneoplastic work up. No organic etiology identified. The decision has been made to await title 19 and long term placement with family. She has been approved for medicaid and is in the process of finding long term facility. ?#HypercalcemiaLast Ca of 10.5 [Alb 2.4]. No back pain, no mood change, urinary and GI symps cannot be assessed due to GT and catheterization.Consistent with assymptomatic hypercalcemiaLast PTH check inappropriately normal, VitD25OH nl previously.WER:[id number] Hyperparathyroidism-encourage mobility and liquid intake-f/u urine Ca (diff [facility] vs PHPt)?#immobility, contractures?- concern for developing contracture of left ankle/foot; patient declines PODUS boots and cannot tolerate resting on foam wedge despite many reminders that this will prevent contractures. Concern she will likely  remain bed bound given prolonged course and the fact she is only getting PT 3x per week while inpatient awaiting placement. - Encouraged more frequent PT (x3 per week)#DispositionPending T-19 approval. Her sister [name]) is her gaurdian, but she has written letter to probate court to revise her guardianship. 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

## 2022-06-28 NOTE — Plan of Care
Plan of Care Overview/ Patient Status    1900-0700: A&Ox3, disoriented to situation. VSS on RA. Denies having pain, but pain noted with turning and repositioned, scheduled medications given with good effect. Continent of B/B, LBM 7/7, ileostomy in place, purewick in place changed this shift. Skin assessment and dressing completed per order. Ax2 in bed. Pills whole. No acute event shift. Turned and repositioned q2hr. Safety maintained. Call bell in reach. Hourly rounding completed.Problem: Adult Inpatient Plan of CareGoal: Plan of Care ReviewOutcome: Interventions implemented as appropriateGoal: Patient-Specific Goal (Individualized)Outcome: Interventions implemented as appropriateGoal: Absence of Hospital-Acquired Illness or InjuryOutcome: Interventions implemented as appropriateGoal: Optimal Comfort and WellbeingOutcome: Interventions implemented as appropriateGoal: Readiness for Transition of CareOutcome: Interventions implemented as appropriate Problem: InfectionGoal: Absence of Infection Signs and SymptomsOutcome: Interventions implemented as appropriate Problem: Physical Therapy GoalsGoal: Physical Therapy GoalsDescription: PT GOALS - addressed 05/02/22.1. Patient will demonstrate active range of motion x 4 extremities in preparation for mobility (GOAL MET)2. Caregivers will be independent and safely implement a range of motion/positioning program to prevent loss of range of motion and skin integrity (ONGOING)3. Patient will perform bed mobility with maximal assist (GOAL MET)4. Patient will tolerate sitting edge of bed >5 minutes with minimal assist (GOAL MET)5. Patient will perform transfers with moderate assist of 2 using rolling walker (ONGOING)6. Pt will perform bed mobility w/ modAx1 (ONGOING)7. Pt will perform STS transfer w/ modAx2 for a standing tolerance of 1 minute. (NEW) Outcome: Interventions implemented as appropriate Problem: Occupational Therapy GoalsGoal: Occupational Therapy GoalsDescription: OT goals1. Pt will move to EOB for seated ADL mod I2. Pt will follow 3/4 simple one step motor commands IND3. Pt will sequence through set up ADL task INDOT re-eval, Initial goals not met, continue to progress towards initial goalsOutcome: Interventions implemented as appropriate Problem: Impaired Wound HealingGoal: Optimal Wound HealingOutcome: Interventions implemented as appropriate Problem: Fall Injury RiskGoal: Absence of Fall and Fall-Related InjuryOutcome: Interventions implemented as appropriate Problem: Skin Injury Risk IncreasedGoal: Skin Health and IntegrityOutcome: Interventions implemented as appropriate Problem: Mobility ImpairmentGoal: Optimal MobilityOutcome: Interventions implemented as appropriate Problem: Pain AcuteGoal: Acceptable Pain Control and Functional AbilityOutcome: Interventions implemented as appropriate Problem: Speech Language Pathology GoalsGoal: Dysphagia Goals, SLPDescription: Current goal: Patient will tolerate regular solids and thin liquids without adverse impact on respiratory status and while maintaining adequate oral nutrition.Prior goal (met): The patient will tolerate least restrictive diet without any decline in respiratory status.Prior goal (met): Pt will tolerate pureed diet with thin liquids w/o s/x of aspiration or adverse impact on respiratory status.Outcome: Interventions implemented as appropriate Problem: Self-Care DeficitGoal: Improved Ability to Complete Activities of Daily LivingOutcome: Interventions implemented as appropriate Problem: Fluid, Electrolyte and Nutrition Imbalance (Ileostomy)Goal: Fluid, Electrolyte and Nutrition BalanceOutcome: Interventions implemented as appropriate Problem: Infection (Ileostomy)Goal: Absence of Infection Signs and SymptomsOutcome: Interventions implemented as appropriate Problem: Confusion ChronicGoal: Optimal Cognitive FunctionOutcome: Interventions implemented as appropriate

## 2022-06-29 LAB — ELECTROLYTE PANEL, URINE, RANDOM     (BH GH LMW YH)
BKR CHLORIDE, URINE, RANDOM: 74 mmol/L
BKR POTASSIUM, URINE, RANDOM: 46 mmol/L
BKR SODIUM, URINE RANDOM: 38 mmol/L

## 2022-06-29 LAB — CREATININE, URINE, RANDOM: BKR CREATININE, URINE, RANDOM: 103 mg/dL

## 2022-06-29 LAB — CALCIUM, URINE, RANDOM     (BH GH L LMW YH): BKR CALCIUM, URINE, RANDOM: 50.5 mg/dL

## 2022-06-29 NOTE — Plan of Care
Plan of Care Overview/ Patient Status    1900-0700: A&Ox3, disoriented to situation. VSS on RA. Denies having pain, but pain noted with turning and repositioned, scheduled medications given with good effect. Continent of B/B, LBM 7/8, ileostomy in place, purewick in place changed this shift. Skin assessment and dressing completed per order. Ax2 in bed. Pills whole. No acute event shift. Turned and repositioned q2hr. Safety maintained. Call bell in reach. Hourly rounding completed.Problem: Adult Inpatient Plan of CareGoal: Plan of Care ReviewOutcome: Interventions implemented as appropriateGoal: Patient-Specific Goal (Individualized)Outcome: Interventions implemented as appropriateGoal: Absence of Hospital-Acquired Illness or InjuryOutcome: Interventions implemented as appropriateGoal: Optimal Comfort and WellbeingOutcome: Interventions implemented as appropriateGoal: Readiness for Transition of CareOutcome: Interventions implemented as appropriate Problem: InfectionGoal: Absence of Infection Signs and SymptomsOutcome: Interventions implemented as appropriate Problem: Physical Therapy GoalsGoal: Physical Therapy GoalsDescription: PT GOALS - addressed 05/02/22.1. Patient will demonstrate active range of motion x 4 extremities in preparation for mobility (GOAL MET)2. Caregivers will be independent and safely implement a range of motion/positioning program to prevent loss of range of motion and skin integrity (ONGOING)3. Patient will perform bed mobility with maximal assist (GOAL MET)4. Patient will tolerate sitting edge of bed >5 minutes with minimal assist (GOAL MET)5. Patient will perform transfers with moderate assist of 2 using rolling walker (ONGOING)6. Pt will perform bed mobility w/ modAx1 (ONGOING)7. Pt will perform STS transfer w/ modAx2 for a standing tolerance of 1 minute. (NEW) Outcome: Interventions implemented as appropriate Problem: Occupational Therapy GoalsGoal: Occupational Therapy GoalsDescription: OT goals1. Pt will move to EOB for seated ADL mod I2. Pt will follow 3/4 simple one step motor commands IND3. Pt will sequence through set up ADL task INDOT re-eval, Initial goals not met, continue to progress towards initial goalsOutcome: Interventions implemented as appropriate Problem: Impaired Wound HealingGoal: Optimal Wound HealingOutcome: Interventions implemented as appropriate Problem: Fall Injury RiskGoal: Absence of Fall and Fall-Related InjuryOutcome: Interventions implemented as appropriate Problem: Skin Injury Risk IncreasedGoal: Skin Health and IntegrityOutcome: Interventions implemented as appropriate Problem: Mobility ImpairmentGoal: Optimal MobilityOutcome: Interventions implemented as appropriate Problem: Pain AcuteGoal: Acceptable Pain Control and Functional AbilityOutcome: Interventions implemented as appropriate Problem: Speech Language Pathology GoalsGoal: Dysphagia Goals, SLPDescription: Current goal: Patient will tolerate regular solids and thin liquids without adverse impact on respiratory status and while maintaining adequate oral nutrition.Prior goal (met): The patient will tolerate least restrictive diet without any decline in respiratory status.Prior goal (met): Pt will tolerate pureed diet with thin liquids w/o s/x of aspiration or adverse impact on respiratory status.Outcome: Interventions implemented as appropriate Problem: Self-Care DeficitGoal: Improved Ability to Complete Activities of Daily LivingOutcome: Interventions implemented as appropriate Problem: Fluid, Electrolyte and Nutrition Imbalance (Ileostomy)Goal: Fluid, Electrolyte and Nutrition BalanceOutcome: Interventions implemented as appropriate Problem: Infection (Ileostomy)Goal: Absence of Infection Signs and SymptomsOutcome: Interventions implemented as appropriate Problem: Confusion ChronicGoal: Optimal Cognitive FunctionOutcome: Interventions implemented as appropriate

## 2022-06-29 NOTE — Plan of Care
Plan of Care Overview/ Patient Status    0700-1900A&O x4, lungs diminished, on RA, no cough. Abd. Soft NT/ND, BS + x4, dressing change to previous ostomy site, site clean and pink; right ileostomy clean & intact draining watery brown mush stool. Purewick in place, draining yellow concentrated urine, encouraged fluids. Random urine specimen collected as per orders.C/O bilateral ankle pain with activity & movement, Voltaren gel as scheduled. T & R with A x 2 in bed, speciality mattress in place, heels offloaded; pt. Nonambulatory, chair position of bed and encouraged bilateral knee extension while supine as per PT recommendations, pillow between knees when supine as preventative. Feeds self with set up assist, calorie count in progress, good PO intake this shift. Pt. has been refusing Banatrol powder or a while as noted on MAR, states I do not like it, it makes me vomit and I do not like banana flavor at all. I do not want it, notified resident H. Alemi.

## 2022-06-29 NOTE — Progress Notes
[facility] - General Medicine Service                                 Inpatient Daily Progress Note Patient: Tayva Easterday, a 66 y.o. 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 Denies any pain/discomfort. Tolerating PO.- Ambulating with walker- Discharge plan pending T-19 approvalObjective Current Medications:MEDS  Scheduled Meds:camphor-menthoL, , Topical (Top), BIDdiclofenac, 4 g, Topical (Top), 4x Dailyenoxaparin (LOVENOX) treatment, 80 mg, Subcutaneous, Q12Hfamotidine, 20 mg, Oral, BIDferrous gluconate, 324 mg, Oral, Every Other Dayfolic acid, 1 mg, Oral, Dailygabapentin, 100 mg, Oral, TIDLidocaine Patch, 1 patch, Transdermal, Q24Hmelatonin, 3 mg, Oral, Nightlyniacin, 100 mg, Oral, Nightlynystatin, 1 Application , Topical (Top), BIDpredniSONE, 5 mg, Oral, Dailypyridoxine (vitamin B6), 100 mg, Oral, Dailyrosuvastatin, 10 mg, Per G Tube, Nightlysodium chloride, 3 mL, IV Push, Q8Hthiamine, 100 mg, Oral, BID PRN Meds:acetaminophen, 975 mg, Q6H PRNdextrose (GLUCOSE) oral liquid 15 g, 15 g, Q15 MIN PRN Orfruit juice, 120 mL, Q15 MIN PRN Orskim milk, 240 mL, Q15 MIN PRNdextrose (GLUCOSE) oral liquid 30 g, 30 g, Q15 MIN PRN Orfruit juice, 240 mL, Q15 MIN PRNdextrose injection, 12.5 g, Q15 MIN PRNdextrose injection, 25 g, Q15 MIN PRNgabapentin, 100 mg, BID PRNglucagon, 1 mg, Once PRNlidocaine, , BID PRNmethyl salicylate-menthol, , BID PRNondansetron, 4 mg, Q6H PRN Orondansetron (ZOFRAN) IV Push, 4 mg, Q6H PRNpolyethylene glycol, 17 g, DAILY PRNsodium chloride, 3 mL, PRN for Line CaretraMADoL, 25 mg, Q12H PRN Allergies:has No Known Allergies.Vitals (Last 24 Hrs):Temp:  [97.5 ?F (36.4 ?C)-98.7 ?F (37.1 ?C)] 97.5 ?F (36.4 ?C)Pulse:  [60-103] 86Resp:  [16-20] 20BP: (94-109)/(56-74) 105/66SpO2:  [96 %-99 %] 97 %Device (Oxygen Therapy): room air Wt Readings from Last 3 Encounters: 06/24/22 73.5 kg 12/13/18 98.6 kg  I/O's (Last 24 Hrs):Gross Totals (Last 24 hours) at 06/29/2022 1325Last data filed at 06/29/2022 0523Intake 357 ml Output 800 ml Net -443 ml  Physical Exam:Gen: Well appearing, no acute distressPulm: Breathing comfortably on room air. Abd: soft, nontender, ostomy in place.Ext: Warm, well-perfused, contracted left foot/ankle with intact passive ROM but weakness in extension and eversionNeuro: Alert and oriented x3, responds to questions appropriately, no focal deficits appreciated. ?Diagnostic Studies CBC/BMP: Recent Labs Lab 2306/07/242 08/02/230644 WBC  --  7.7 HGB  --  11.4* PLT  --  278 NA 139  --  K 3.8  --  CO2 22  --  ANIONGAP 9  --  BUN 11  --  CREATININE 0.37*  --  Lytes, Coags, LFTs: Recent Labs Lab 2306/07/242 CALCIUM 10.5* MG 1.9 Glucose Trend: Recent Labs Lab 07/23/642 GLU 94 Troponins: No results for input(s): TROPONINT in the last 168 hours.pHart: No results for input(s): PHART, PCO2ART, PO2ART, HCO3ART, O2SATART, LITERFLOW in the last 168 hours.COVID Monitoring: Lab Results Component Value Date  SARSCOV2 Negative 02/23/2022  HSCRP 12.0 (H) 05/28/2022  PROCALCITON 2.08 (H) 04/08/2022  FIBRINOGEN 386 12/13/2018  CKTOTAL 39 05/11/2022 Liver Seriologies:	- Virology: HAV IgG/IgM, HBSAg, HBSAb, HBCAb, HCV Ab, HIV	- Metabolic: A1AT, ceruloplasmin, iron studies with ferritin, TSH	- Autoimmune: ANA, ASMA, Immunoglobulin G, anti-LKM, anti-TTG      Microbio Date Status Species/Sensitivity Blood - - - Urine - - - Sputum - - - Others - - - Lab Results Component Value Date  LABBLOO No Growth after 5 days of incubation 04/05/2022  LABBLOO No Growth after 5 days of incubation 04/05/2022  LABBLOO No Growth after 5 days of incubation 04/02/2022  LABBLOO No Growth after 5 days of incubation 04/02/2022  LABBLOO No Growth after 5 days of incubation 02/12/2022  LABBLOO No Growth after 5 days of incubation 02/12/2022  Imaging/Monitors  RadiologyXR Ankle Left AP Lateral and Oblique Final Result   No acute osseous abnormality.      [facility] Radiology Notify System:  Routine  Reported And Signed By: Ricci Barker, MD    [facility] Radiology and Biomedical Imaging   US Duplex Lower Extremity Venous Bilat Final Result    Nonocclusive acute deep venous thrombus is noted within the right popliteal vein and tibioperoneal trunk.  [place] Radiology Notify System Classification: [place] - Urgent without Follow-up  Reported And Signed By: Verl Bangs, MD    [facility] Radiology and Biomedical Imaging   XR Knee Bilateral AP and Lateral ([facility] Y[facility] YHC LM) Final Result   Degenerative changes, as above.   [facility] Radiology Notify System:  Routine  Reported And Signed By: Ricci Barker, MD    [facility] Radiology and Biomedical Imaging   XR Foot Right AP Lateral and Oblique Final Result   No acute osseous abnormality. Please note evaluation is markedly limited due to osseous demineralization .   [facility] Radiology Notify System:  Routine  Reported And Signed By: Ricci Barker, MD    [facility] Radiology and Biomedical Imaging   US Duplex Lower Extremity Venous Bilat Final Result    No evidence of deep venous thrombosis in bilateral femoral/popliteal veins.  Please note that the left upper profunda femoral vein and bilateral calf veins were not visualized. If there is continued clinical concern, re-evaluation can be performed after 5-7 days to evaluate for propagation of an unseen thrombus from the calf.  [place] Radiology Notify System Classification: Routine  Reported And Signed By: Verl Bangs, MD    [facility] Radiology and Biomedical Imaging   [place] Abdomen Pelvis w IV Contrast Final Result  1. No evidence of abscess and no other acute [place] findings in the abdomen pelvis. 2. Persistent and similar possibly slightly decreased peritoneal nodularity.  Report Initiated By:  Idalia Needle, MD  Reported And Signed By: Glo Herring, MD    [facility] Radiology and Biomedical Imaging   XR Chest PA or AP Final Result   Mild pulmonary interstitial edema. No focal airspace consolidation.  Reported And Signed By: Cori Razor, MD    [facility] Radiology and Biomedical Imaging   XR Abdomen AP Final Result  XR Abdomen AP Final Result  IR Gastrointestinal procedures Final Result Successful gastrostomy replacement using a MIC 24 French gastrostomy tube. The tube is ready for immediate use. The tube should be flushed twice daily with 15 cc Tap Water.  Report Initiated By:  Thana Farr, MD  Reported And Signed By: Brunetta Jeans, MD    [facility] Radiology and Biomedical Imaging   XR Chest PA or AP (Portable) Final Result    No consolidations to suggest pneumonia.  Reported And Signed By: Leilani Merl, MD    [facility]  Radiology and Biomedical Imaging   XR Foot Bilateral AP Lateral and Oblique Final Result  1. Diffuse degenerative changes without evidence of acute osseous injury. There is no evidence of bony erosion.  Reported And Signed By: Merdis Delay, MD    [facility] Radiology and Biomedical Imaging   MRI Brain w wo IV Contrast Final Result  1. 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 2. Redemonstration microvascular disease no new [place] restricted diffusion hemorrhage or midline shift. 3.  Motion limits assessment of the heterogeneous maxillary alveolus correlate with dental inspection Report Initiated By:  Neta Mends, MD  Reported And Signed By: Lynett Fish, MD    [facility] Radiology and Biomedical Imaging   MRI Total Spine w wo IV Contrast Final Result  1. Scoliotic deformity of the thoracolumbar spine. 2. 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 3. No obvious epidural abscess or paraspinal collection. No findings suspicious for discitis/osteomyelitis. Mild perifacet inflammatory changes identified at L4-5, likely degenerative. Mild increased T2 signal and enhancement within the posterior paraspinal muscles diffusely throughout the lumbar spine could be related to denervation or inflammation.  Examination is significantly degraded by patient motion and sagittal T2 signal changes within the spinal cord or abnormal intrathecal enhancement may not be apparent on this examination. Examination to be repeated with sedation if clinically indicated.  Report Initiated By:  Neta Mends, MD  Reported And Signed By: Aviva Kluver, MD    [facility] Radiology and Biomedical Imaging   XR Hips Bilateral AP and Lateral with AP Pelvis Final Result  US Abdomen Pelvis Vascular Study Complete ([facility] Y[facility] Y[facility] BH LM) Final Result    No evidence of portal vein or hepatic vein thrombosis.  Increased liver echogenicity, which is most commonly noted with hepatic steatosis. However, please note that more advanced forms of liver disease, including hepatic fibrosis/early cirrhosis may have a similar imaging appearance.   [place] Radiology Notify System Classification: Routine  Report Initiated By:  Kreg Shropshire, MD  Reported And Signed By: Verl Bangs, MD    [facility] Radiology and Biomedical Imaging   Hospitalist Procedure [facility]) Final Result  Hospitalist Procedure [facility]) Final Result  [place] Head wo IV Contrast Final Result Since the prior exam, there has been a significant increase in ventricular size and sylvian fissures. In the appropriate clinical setting normal pressure hydrocephalus can be considered.  Report Initiated By:  Consuela Mimes, MD  Reported And Signed By: Cherre Blanc, MD    [facility] Radiology and Biomedical Imaging   [place] Chest wo IV Contrast Final Result  Patchy groundglass opacities in bilateral lower lobes with 4 mm right lower lobe lung nodule, findings may represent infectious/inflammatory etiology in a proper clinical setting. Short-term follow-up can be obtained for resolution. Otherwise consider follow-up chest [place] in 12 months for stability.  Reported And Signed By: Antonieta Loveless, MD    [facility] Radiology and Biomedical Imaging    Abdomen Pelvis w IV Contrast Final Result No collection in abdomen or pelvis.   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 Correlation with prior imaging is recommended.    Reported And Signed By: Madie Reno, MD    [facility] Radiology and Biomedical Imaging   CXR (portable) Final Result   No acute cardiothoracic abnormality.  Report Initiated By:  Tessie Eke, RRA  Reported And Signed By: Alberteen Sam, MD    [facility] Radiology and Biomedical Imaging  [place] RESULT SCAN Final Result  [place] RESULT SCAN Final Result  Outside Interpretation [place] Abdomen and/or Pelvis Final Result  [place] RESULT SCAN Final Result  XRAY RESULT SCAN Final Result  Outside Interpretation MRI Neuro Final Result   Only motion degraded T1 images are available for review which significantly limits evaluation. Mild ventriculomegaly. No focal mass or intracranial hemorrhage is identified.    Report Initiated By:  Renato Gails, MD  Reported And Signed By: Vicki Mallet, MD    [facility] Radiology and Biomedical Imaging   MRI RESULT SCAN Final Result  Outside Interpretation MRI Neuro Final Result Multilevel degenerative changes as described above. Report Initiated By:  Renato Gails, MD  Reported And Signed By: Vicki Mallet, MD    [facility] Radiology and Biomedical Imaging   Outside Interpretation MRI Neuro Final Result   Apparent multiple foci of T2 hyperintensity in the thoracic cord throughout its length including at the level of the conus in the setting of motion artifact. It is unclear if these are artifactual or relate to lesions within the cord. Repeat T2 axial and T2 sagittal images of the thoracic spine are recommended.   Levoscoliotic curvature of the thoracic spine with no significant spinal canal stenosis or neural foraminal narrowing.   Report Initiated By:  Renato Gails, MD  Reported And Signed By: Vicki Mallet, MD    [facility] Radiology and Biomedical Imaging   MRI RESULT SCAN Final Result  [facility] MRI Cervical Spine Final Result  Outside Interpretation [place] Abdomen and/or Pelvis Final Result  [place] RESULT SCAN Final Result  Outside Interpretation [place] Abdomen and/or Pelvis Final Result  [place] RESULT SCAN Final Result  ED Procedural [place] Peripheral Venous Access    (Results Pending)   CardiacEKG:Results for orders placed or performed during the hospital encounter of 02/11/22 EKG Result Value Ref Range  Heart Rate 108 bpm  QRS Interval 79 ms  QT Interval 332 ms  QTC Interval 445 ms  P Axis 43 deg  QRS Axis -11 deg  T Wave Axis 52 deg  P-R Interval 124 msec  SEVERITY Abnormal ECG severity TTE:No results found for this or any previous visit. Assessment & Plan 66 yo F with who is admitted here in [facility] Feb 11 2022 due to ongoing encephalopathy and failure to thrive by sister, which after resolution has been awaiting disposition pending for long term care service compounded by lack of medicaid. Social work service has been working on the process. Hypercalcemia was found in her w/u with appropriately normal PTH which is currently under workup otherwise no clinical active problem.  ?#DispositionPending T-19 approval. Her sister [name]) is her gaurdian, but she has written letter to probate court to revise her guardianship. SW is informed about the situatuation.-F/W with SW about her dispo status?#HypercalcemiaLast Ca of 10.5 [Alb 2.4]. 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 Concern she will likely remain bed bound given prolonged course and the fact she is only getting PT 3x per week while inpatient awaiting placement. 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

## 2022-06-30 MED ORDER — FAMOTIDINE 20 MG TABLET
20 mg | Freq: Every day | ORAL | Status: DC
Start: 2022-06-30 — End: 2022-08-15
  Administered 2022-07-01 – 2022-08-14 (×45): 20 mg via ORAL

## 2022-06-30 MED ORDER — ACETAMINOPHEN 325 MG TABLET
325 mg | Freq: Four times a day (QID) | ORAL | Status: DC | PRN
Start: 2022-06-30 — End: 2022-08-15
  Administered 2022-07-01 – 2022-08-14 (×36): 325 mg via ORAL

## 2022-06-30 NOTE — Progress Notes
[facility] - General Medicine Service                                 Inpatient Daily Progress Note Patient: Danielle [name], a 66 y.o. 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 PHPT vs [facility]HH (24hr Urine collection not possible)Overnight:No adverse eventThis AM:- f/u w SW about her dispo status- Patient has no active complaints. Denies any pain/discomfort. Tolerating PO.- Discharge plan pending T-19 approvalObjective Current Medications:MEDS  Scheduled Meds:camphor-menthoL, , Topical (Top), BIDdiclofenac, 4 g, Topical (Top), 4x Dailyenoxaparin (LOVENOX) treatment, 80 mg, Subcutaneous, Q12Hfamotidine, 20 mg, Oral, BIDferrous gluconate, 324 mg, Oral, Every Other Dayfolic acid, 1 mg, Oral, Dailygabapentin, 100 mg, Oral, TIDLidocaine Patch, 1 patch, Transdermal, Q24Hmelatonin, 3 mg, Oral, Nightlyniacin, 100 mg, Oral, Nightlynystatin, 1 Application , Topical (Top), BIDpredniSONE, 5 mg, Oral, Dailypyridoxine (vitamin B6), 100 mg, Oral, Dailyrosuvastatin, 10 mg, Per G Tube, Nightlysodium chloride, 3 mL, IV Push, Q8Hthiamine, 100 mg, Oral, BID PRN Meds:acetaminophen, 975 mg, Q6H PRNdextrose (GLUCOSE) oral liquid 15 g, 15 g, Q15 MIN PRN Orfruit juice, 120 mL, Q15 MIN PRN [place] milk, 240 mL, Q15 MIN PRNdextrose (GLUCOSE) oral liquid 30 g, 30 g, Q15 MIN PRN Orfruit juice, 240 mL, Q15 MIN PRNdextrose injection, 12.5 g, Q15 MIN PRNdextrose injection, 25 g, Q15 MIN PRNgabapentin, 100 mg, BID PRNglucagon, 1 mg, Once PRNlidocaine, , BID PRNmethyl salicylate-menthol, , BID PRNondansetron, 4 mg, Q6H PRN Orondansetron (ZOFRAN) IV Push, 4 mg, Q6H PRNpolyethylene glycol, 17 g, DAILY PRNsodium chloride, 3 mL, PRN for Line CaretraMADoL, 25 mg, Q12H PRN Allergies:has No Known Allergies.Vitals (Last 24 Hrs):Temp:  [97.3 ?F (36.3 ?C)-97.8 ?F (36.6 ?C)] 97.8 ?F (36.6 ?C)Pulse:  [69-72] 69Resp:  [16-19] 16BP: (107-118)/(65-74) 118/65SpO2:  [97 %-99 %] 99 %Device (Oxygen Therapy): room air Wt Readings from Last 3 Encounters: 06/30/22 75 kg 12/13/18 98.6 kg  I/O's (Last 24 Hrs):Gross Totals (Last 24 hours) at 06/30/2022 1706Last data filed at 06/30/2022 1600Intake 474 ml Output 1175 ml Net -701 ml  Physical Exam:Gen: Well appearing, no acute distressPulm: Breathing comfortably on room air. Abd: soft, nontender, ostomy in place.Ext: Warm, well-perfused, contracted left foot/ankle with intact passive ROM but weakness in extension and eversionNeuro: Alert and oriented x3, responds to questions appropriately, no focal deficits appreciated. ?Diagnostic Studies CBC/BMP: Recent Labs Lab Jul 13, 230643 July 13, 230644 WBC  --  7.7 HGB  --  11.4* PLT  --  278 NA 139  --  K 3.8  --  CO2 22  --  ANIONGAP 9  --  BUN 11  --  CREATININE 0.37*  --  Lytes, Coags, LFTs: Recent Labs Lab [id number] CALCIUM 10.5* MG 1.9 Glucose Trend: Recent Labs Lab [id number] GLU 94 Troponins: No results for input(s): TROPONINT in the last 168 hours.pHart: No results for input(s): PHART, PCO2ART, PO2ART, HCO3ART, O2SATART, LITERFLOW in the last 168 hours.COVID Monitoring: Lab Results Component Value Date  SARSCOV2 Negative 02/23/2022  HSCRP 12.0 (H) 05/28/2022  PROCALCITON 2.08 (H) 04/08/2022  FIBRINOGEN 386 12/13/2018  CKTOTAL  39 05/11/2022 Liver Seriologies:	- Virology: HAV IgG/IgM, HBSAg, HBSAb, HBCAb, HCV Ab, HIV	- Metabolic: A1AT, ceruloplasmin, iron studies with ferritin, TSH	- Autoimmune: ANA, ASMA, Immunoglobulin G, anti-LKM, anti-TTG      Microbio Date Status Species/Sensitivity Blood - - - Urine - - - Sputum - - - Others - - - Lab Results Component Value Date  LABBLOO No Growth after 5 days of incubation 04/05/2022  LABBLOO No Growth after 5 days of incubation 04/05/2022  LABBLOO No Growth after 5 days of incubation 04/02/2022  LABBLOO No Growth after 5 days of incubation 04/02/2022  LABBLOO No Growth after 5 days of incubation 02/12/2022  LABBLOO No Growth after 5 days of incubation 02/12/2022  Imaging/Monitors  RadiologyXR Ankle Left AP Lateral and Oblique Final Result   No acute osseous abnormality.      [facility] Radiology Notify System:  Routine  Reported And Signed By: Ricci Barker, MD    [facility] Radiology and Biomedical Imaging   US Duplex Lower Extremity Venous Bilat Final Result    Nonocclusive acute deep venous thrombus is noted within the right popliteal vein and tibioperoneal trunk.  [place] Radiology Notify System Classification: [place] - Urgent without Follow-up  Reported And Signed By: Verl Bangs, MD    [facility] Radiology and Biomedical Imaging   XR Knee Bilateral AP and Lateral ([facility] BH YH YHC LM) Final Result   Degenerative changes, as above.   [facility] Radiology Notify System:  Routine  Reported And Signed By: Ricci Barker, MD    [facility] Radiology and Biomedical Imaging   XR Foot Right AP Lateral and Oblique Final Result   No acute osseous abnormality.   Please note evaluation is markedly limited due to osseous demineralization .   [facility] Radiology Notify System:  Routine  Reported And Signed By: Ricci Barker, MD    [facility] Radiology and Biomedical Imaging   US Duplex Lower Extremity Venous Bilat Final Result    No evidence of deep venous thrombosis in bilateral femoral/popliteal veins.  Please note that the left upper profunda femoral vein and bilateral calf veins were not visualized. If there is continued clinical concern, re-evaluation can be performed after 5-7 days to evaluate for propagation of an unseen thrombus from the calf.  [place] Radiology Notify System Classification: Routine  Reported And Signed By: Verl Bangs, MD    [facility] Radiology and Biomedical Imaging   [place] Abdomen Pelvis w IV Contrast Final Result  1. No evidence of abscess and no other acute [place] findings in the abdomen pelvis. 2. Persistent and similar possibly slightly decreased peritoneal nodularity.  Report Initiated By:  Idalia Needle, MD  Reported And Signed By: Glo Herring, MD    [facility] Radiology and Biomedical Imaging   XR Chest PA or AP Final Result   Mild pulmonary interstitial edema. No focal airspace consolidation.  Reported And Signed By: Cori Razor, MD    [facility] Radiology and Biomedical Imaging   XR Abdomen AP Final Result  XR Abdomen AP Final Result  IR Gastrointestinal procedures Final Result Successful gastrostomy replacement using a MIC 24 French gastrostomy tube. The tube is ready for immediate use. The tube should be flushed twice daily with 15 cc Tap Water.  Report Initiated By:  Thana Farr, MD  Reported And Signed By: Brunetta Jeans, MD     [facility] Rad[facility] and Biomedical [facility]   XR Chest PA or AP (Portable) Final Result    No consolidations to suggest pneumonia.  Reported And Signed By: Leilani Merl, MD  [place] Radiology and Biomedical Imaging   XR Foot Bilateral AP Lateral and Oblique Final Result  1. Diffuse degenerative changes without evidence of acute osseous injury. There is no evidence of bony erosion.  Reported And Signed By: Merdis Delay, MD    [facility] Radiology and Biomedical Imaging   MRI Brain w wo IV Contrast Final Result  1. 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 2. Redemonstration microvascular disease no new [place] restricted diffusion hemorrhage or midline shift. 3.  Motion limits assessment of the heterogeneous maxillary alveolus correlate with dental inspection Report Initiated By:  Neta Mends, MD  Reported And Signed By: Lynett Fish, MD    [facility] Radiology and Biomedical Imaging   MRI Total Spine w wo IV Contrast Final Result  1. Scoliotic deformity of the thoracolumbar spine. 2. 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 3. No obvious epidural abscess or paraspinal collection. No findings suspicious for discitis/osteomyelitis. Mild perifacet inflammatory changes identified at L4-5, likely degenerative. Mild increased T2 signal and enhancement within the posterior paraspinal muscles diffusely throughout the lumbar spine could be related to denervation or inflammation.  Examination is significantly degraded by patient motion and sagittal T2 signal changes within the spinal cord or abnormal intrathecal enhancement may not be apparent on this examination. Examination to be repeated with sedation if clinically indicated.  Report Initiated By:  Neta Mends, MD  Reported And Signed By: Aviva Kluver, MD    [facility] Radiology and Biomedical Imaging   XR Hips Bilateral AP and Lateral with AP Pelvis Final Result  US Abdomen Pelvis Vascular Study Complete (GH Y[facility] Y[facility] BH LM) Final Result    No evidence of portal vein or hepatic vein thrombosis.  Increased liver echogenicity, which is most commonly noted with hepatic steatosis. However, please note that more advanced forms of liver disease, including hepatic fibrosis/early cirrhosis may have a similar imaging appearance.   [place] Radiology Notify System Classification: Routine  Report Initiated By:  Kreg Shropshire, MD  Reported And Signed By: Verl Bangs, MD    [facility] Radiology and Biomedical Imaging   Hospitalist Procedure [facility]) Final Result  Hospitalist Procedure [facility]) Final Result  [place] Head wo IV Contrast Final Result Since the prior exam, there has been a significant increase in ventricular size and sylvian fissures. In the appropriate clinical setting normal pressure hydrocephalus can be considered.  Report Initiated By:  Consuela Mimes, MD  Reported And Signed By: Cherre Blanc, MD    [facility] Radiology and Biomedical Imaging   [place] Chest wo IV Contrast Final Result  Patchy groundglass opacities in bilateral lower lobes with 4 mm right lower lobe lung nodule, findings may represent infectious/inflammatory etiology in a proper clinical setting. Short-term follow-up can be obtained for resolution. Otherwise consider follow-up chest [place] in 12 months for stability.  Reported And Signed By: Antonieta Loveless, MD    [facility] Radiology and Biomedical Imaging   [place] Abdomen Pelvis w IV Contrast Final Result No collection in abdomen or pelvis.   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 Correlation with prior imaging is recommended.    Reported And Signed By: Madie Reno, MD    [facility] Radiology and Biomedical Imaging   CXR (portable) Final Result   No acute cardiothoracic abnormality.  Report Initiated By:  Tessie Eke, RRA  Reported And Signed By: Alberteen Sam, MD    [facility] Radiology and Biomedical Imaging [place]  RESULT SCAN Final Result  [place] RESULT SCAN Final Result  Outside Interpretation [place] Abdomen and/or Pelvis Final Result  [place] RESULT SCAN Final Result  XRAY RESULT SCAN Final Result  Outside Interpretation MRI Neuro Final Result   Only motion degraded T1 images are available for review which significantly limits evaluation. Mild ventriculomegaly. No focal mass or intracranial hemorrhage is identified.    Report Initiated By:  Renato Gails, MD  Reported And Signed By: Vicki Mallet, MD    [facility] Radiology and Biomedical Imaging   MRI RESULT SCAN Final Result  Outside Interpretation MRI Neuro Final Result Multilevel degenerative changes as described above. Report Initiated By:  Renato Gails, MD  Reported And Signed By: Vicki Mallet, MD    [facility] Radiology and Biomedical Imaging   Outside Interpretation MRI Neuro Final Result   Apparent multiple foci of T2 hyperintensity in the thoracic cord throughout its length including at the level of the conus in the setting of motion artifact. It is unclear if these are artifactual or relate to lesions within the cord. Repeat T2 axial and T2 sagittal images of the thoracic spine are recommended.   Levoscoliotic curvature of the thoracic spine with no significant spinal canal stenosis or neural foraminal narrowing.   Report Initiated By:  Renato Gails, MD  Reported And Signed By: Vicki Mallet, MD    [facility] Radiology and Biomedical Imaging   MRI RESULT SCAN Final Result  [facility] MRI Cervical Spine Final Result  Outside Interpretation [place] Abdomen and/or Pelvis Final Result  [place] RESULT SCAN Final Result  Outside Interpretation [place] Abdomen and/or Pelvis Final Result  [place] RESULT SCAN Final Result  ED Procedural [place] Peripheral Venous Access    (Results Pending)   CardiacEKG:Results for orders placed or performed during the hospital encounter of 02/11/22 EKG Result Value Ref Range  Heart Rate 108 bpm  QRS Interval 79 ms  QT Interval 332 ms  QTC Interval 445 ms  P Axis 43 deg  QRS Axis -11 deg  T Wave Axis 52 deg  P-R Interval 124 msec  SEVERITY Abnormal ECG severity TTE:No results found for this or any previous visit. Assessment & Plan 66 yo F with who is [facility] due to ongoing encephalopathy and failure to thrive by sister, which after resolution has been awaiting disposition pending for long term care service compounded by lack of medicaid. Social work service has been working on the process. Hypercalcemia was found in her w/u with appropriately normal PTH which is currently under workup otherwise no clinical active problem.  ?#DispositionPending T-19 approval. Her sister [name]) is her gaurdian, but she has written letter to probate court to revise her guardianship. SW is informed about the situatuation.- f/u with SW about her dispo status?#HypercalcemiaLast Ca of 10.5 [Alb 2.4]. No back pain, no mood change, urinary and GI symps cannot be assessed due to GT and catheterization. 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 No symptoms. 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 Concern she will likely remain bed bound given prolonged course and the fact she is only getting PT 3x per week while inpatient awaiting placement. 	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

## 2022-06-30 NOTE — Plan of Care
Plan of Care Overview/ Patient Status    0700-1900A&O x 3, disoriented to situation. Lungs diminished, abd. soft NT/ND, ileostomy in place, draining watery brown stool in AM; appliance changed at 1100, stool at time brown; appliance changed again at 1756 r/t appliance bursting; stool at this time watery dark pink in color (pt. drank multiple Ensure clear raspberry), pt. with mild transient nausea, pt. states You guys want me to eat all this food and drink all these Ensures, what do you expect? VSS; notified Resident H. Alemi of stool color, NNO. Pt. incontinent of urine, purewick in place (purewick with episodes of displacement r/t pt.'s position in bed); orders received for 24 hr. Urine collection, notified Resident Alemi r/t possible inaccurate collection of 24 hr.s urine as ordered r/t purewick displacement at times; order d/c and random urine added on to AM specimen.Pt. feeds self with set up assist with good intake. T&R Q 2 hrs . A x2. As tolerates and allows, propped with pillows. C/O ankle/heel pain, Voltaren gel in progress with good effect.

## 2022-06-30 NOTE — Plan of Care
Plan of Care Overview/ Patient Status    Problem: Adult Inpatient Plan of CareGoal: Plan of Care ReviewOutcome: Interventions implemented as appropriateGoal: Patient-Specific Goal (Individualized)Outcome: Interventions implemented as appropriateGoal: Absence of Hospital-Acquired Illness or InjuryOutcome: Interventions implemented as appropriateGoal: Optimal Comfort and WellbeingOutcome: Interventions implemented as appropriateGoal: Readiness for Transition of CareOutcome: Interventions implemented as appropriate Problem: InfectionGoal: Absence of Infection Signs and SymptomsOutcome: Interventions implemented as appropriate Problem: Physical Therapy GoalsGoal: Physical Therapy GoalsDescription: PT GOALS - addressed 05/02/22.1. Patient will demonstrate active range of motion x 4 extremities in preparation for mobility (GOAL MET)2. Caregivers will be independent and safely implement a range of motion/positioning program to prevent loss of range of motion and skin integrity (ONGOING)3. Patient will perform bed mobility with maximal assist (GOAL MET)4. Patient will tolerate sitting edge of bed >5 minutes with minimal assist (GOAL MET)5. Patient will perform transfers with moderate assist of 2 using rolling walker (ONGOING)6. Pt will perform bed mobility w/ modAx1 (ONGOING)7. Pt will perform STS transfer w/ modAx2 for a standing tolerance of 1 minute. (NEW) Outcome: Interventions implemented as appropriate Problem: Occupational Therapy GoalsGoal: Occupational Therapy GoalsDescription: OT goals1. Pt will move to EOB for seated ADL mod I2. Pt will follow 3/4 simple one step motor commands IND3. Pt will sequence through set up ADL task INDOT re-eval, Initial goals not met, continue to progress towards initial goalsOutcome: Interventions implemented as appropriate Problem: Impaired Wound HealingGoal: Optimal Wound HealingOutcome: Interventions implemented as appropriate Problem: Fall Injury RiskGoal: Absence of Fall and Fall-Related InjuryOutcome: Interventions implemented as appropriate Problem: Skin Injury Risk IncreasedGoal: Skin Health and IntegrityOutcome: Interventions implemented as appropriate Problem: Mobility ImpairmentGoal: Optimal MobilityOutcome: Interventions implemented as appropriate Problem: Pain AcuteGoal: Acceptable Pain Control and Functional AbilityOutcome: Interventions implemented as appropriate Problem: Speech Language Pathology GoalsGoal: Dysphagia Goals, SLPDescription: Current goal: Patient will tolerate regular solids and thin liquids without adverse impact on respiratory status and while maintaining adequate oral nutrition.Prior goal (met): The patient will tolerate least restrictive diet without any decline in respiratory status.Prior goal (met): Pt will tolerate pureed diet with thin liquids w/o s/x of aspiration or adverse impact on respiratory status.Outcome: Interventions implemented as appropriate Problem: Self-Care DeficitGoal: Improved Ability to Complete Activities of Daily LivingOutcome: Interventions implemented as appropriate Problem: Fluid, Electrolyte and Nutrition Imbalance (Ileostomy)Goal: Fluid, Electrolyte and Nutrition BalanceOutcome: Interventions implemented as appropriate Problem: Infection (Ileostomy)Goal: Absence of Infection Signs and SymptomsOutcome: Interventions implemented as appropriate Problem: Confusion ChronicGoal: Optimal Cognitive FunctionOutcome: Interventions implemented as appropriate Patient baseline, vss, on room air. Colostomy bag was changed during day shift, and again during night shift due to leaking. Still pinkish tinge to drainage that we assume is from her ensure supplements. No complaints over night. Medications given as prescribed.

## 2022-06-30 NOTE — Plan of Care
Plan of Care Overview/ Patient Status    0700-1900A&O: x3 d/o situation Cardiac: WDL Resp: WDL room air, denies SOBGI/GU: ileostomy. Incontinent of urine, purwick in place.Diet: regular. Calorie count continued Skin: scarring from old wounds otherwise intact, barrier cream appliedPain: left foot/ankle pain. See MAR for interventions Mobility: total Access: none Pills: whole Safety maintained, call bell in reach, bed alarm set, rounding continued. Q2 T&R

## 2022-07-01 MED ORDER — TRAMADOL (ULTRAM) 25 MG HALFTAB
25 mg | Freq: Two times a day (BID) | ORAL | Status: DC | PRN
Start: 2022-07-01 — End: 2022-08-12
  Administered 2022-07-02 – 2022-08-10 (×48): 25 mg via ORAL

## 2022-07-01 MED ORDER — TRAMADOL (ULTRAM) 25 MG HALFTAB
25 mg | Freq: Two times a day (BID) | ORAL | Status: DC
Start: 2022-07-01 — End: 2022-07-01
  Administered 2022-07-01: 14:00:00 25 mg via ORAL

## 2022-07-01 NOTE — Plan of Care
Plan of Care Overview/ Patient Status    Medical Social Work Follow Up  [employer] Most Recent Value Admission Information  Document Type Progress Note Reason for Inability to Assess --  [Patient mental status is altered] (For Inpatient/ED Only) Prior psychosocial assessment has been documented within this hospitalization No (For Inpatient/ED Only) Prior psychosocial assessment has been documented within 30 days of this hospitalization No Reason for Current Social Work Involvement Support/Coping Source of Information Patient Family/Caregiver Comment Public librarian Medical Team Comment Transition Coordination, Medical Team Community Agency Comment [place] [address] Record Reviewed Yes Level of Care Inpatient Psychosocial issues requiring intervention Support Psychosocial interventions 10 minutes spent on the phone with Danielle [name]. I provided support as Danielle [name] about her plans regarding her finances and credit cards. She reported that she would like Danielle [name] to bring her personal items to her. I informed her that I would follow-up with Danielle [name] regarding her request. Collaborations None Specific referrals to enhance community supports (include existing and new resources) None Handoff Required? No Next Steps/Plan (including hand-off): I will continue to remain involved for support as needed. Signature: Leim Fabry, LCSW Contact Information: [phone]

## 2022-07-01 NOTE — Plan of Care
Plan of Care Overview/ Patient Status    1900-0700 Alert and oriented x3. Disoriented to situation. VSS on RA. PRN tylenol given for LLE pain with good effect. Calorie count continued. Right ileostomy intact. Purewick in place. Incontinence care provided as needed. Awaiting T19/LTP. Turned and repositioned q2h. Safety maintained. Hourly rounding completed. See flowsheets for assessments. Problem: Adult Inpatient Plan of CareGoal: Plan of Care ReviewOutcome: Interventions implemented as appropriateFlowsheets (Taken 07/01/2022 0118)Progress: no changePlan of Care Reviewed With: patient

## 2022-07-01 NOTE — Plan of Care
Inpatient Physical Therapy Progress Note IP Adult PT Eval/Treat - 07/01/22 1448    Date of Visit / Treatment  Date of Visit / Treatment 07/01/22   Progress Report Due 07/02/22   End Time 1448    General Information  Subjective my L leg doesnt move   General Observations supine in bed. NAD. cleared by RN. purewick   Precautions/Limitations Fall Precautions;Bed alarm;Chair alarm    Vital Signs and Orthostatic Vital Signs  Vital Signs Vital Signs Stable   Vital Signs Free text RA, NAD    Pain/Comfort  Location #1 - PreTreatment Rating (Numbers Scale) 0/10 - no pain   Posttreatment Rating (Numbers Scale) 0/10 - no pain   Pain Comment (Pre/Post Treatment Pain) expressing pain with mobility to LLE but did not quantify, nurse aware    Patient Coping  Observed Emotional State accepting   Diversional Activities television    Cognition  Overall Cognitive Status WFL   Level of Consciousness alert   Following Commands Follows one step commands without difficulty   Personal Safety / Judgment Fall risk    Skin Assessment  Skin Assessment See Nursing Documentation    Balance  Balance Skills Training Comment did not mobilize today 2/2 weakness    Bed Mobility  Bed Mobility Comments pain limited mobility to EOB    Gait Training  Gait Training Comments unable to perform    Handoff Documentation  Handoff Patient in bed;Bed alarm;Patient instructed to call nursing for mobility;Discussed with nursing    Endurance  Endurance Comments poor    PT- AM-PAC - Basic Mobility Screen- How much help from another person do you currently need.....  Turning from your back to your side while in a a flat bed without using rails? 1 - Total - Requires total assistance or cannot do it at all.   Moving from lying on your back to sitting on the side of a flat bed without using bed rails? 1 - Total - Requires total assistance or cannot do it at all. Moving to and from a bed to a chair (including a wheelchair)? 1 - Total - Requires total assistance or cannot do it at all.   Standing up from a chair using your arms(e.g., wheelchair or bedside chair)? 1 - Total - Requires total assistance or cannot do it at all.   To walk in a hospital room? 1 - Total - Requires total assistance or cannot do it at all.   Climbing 3-5 steps with a railing? 1 - Total - Requires total assistance or cannot do it at all.   AMPAC Mobility Score 6   TARGET Highest Level of Mobility Mobility Level 2, Turn self in bed/bed activity/dependent transfer    ACTUAL Highest Level of Mobility Mobility Level 2, Turn self in bed/bed activity/ dependent transfer    Therapeutic Exercise  Therapeutic Exercise Comments PROM BLE's, AROM BUE's. trunk and core stabilization exercises    Therapeutic Functional Activity  Therapeutic Functional Activity Comments trunk rotation, core stabilization, pt education, activity tolerance    Clinical Impression  Follow up Assessment patient seen for f/u PT. tolerated bed level ther-ex to all extremeties. pain with movement to LLE. did tolerate some PROM to LLE but with limited range. participated in trunck rotation reaching to opposite bed rail and core stability training as well. would benefit from continued PT to improve mobility as able.    Patient/Family Stated Goals  Patient/Family Stated Goal(s) feel better    Frequency/Equipment Recommendations  PT Frequency 2x  per week   What day of week is next treatment expected? Wednesday   re-evaluation 07/02/22  PT/PTA completing this assessment [place]    PT Recommendations for Inpatient Admission  Activity/Level of Assist dangle edge of bed;assist of 2   Therapeutic Exercise ROM as tolerated;encourage exercise program issued    Planned Treatment / Interventions  General Treatment / Interventions Aerobic Capacity / Endurance Conditioning   Education Treatment / Interventions Patient Education / Training    PT Discharge Summary  Physical Therapy Disposition Recommendation SNF for long term placement     Vilma Meckel, PTAMHB# [phone]

## 2022-07-01 NOTE — Progress Notes
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 Interim Events: Yesterday: no clinical change, remains stable, discontinued multivitamins to reduce polypharmacy.  Overnight: NAEToday: reports feeling good. Denies any pain. Meds: Scheduled Meds:Current Facility-Administered Medications Medication Dose Route Frequency Provider Last Rate Last Admin ? diclofenac (VOLTAREN) 1 % gel 4 g  4 g Topical (Top) 4x Daily Larna Daughters, MD   4 g at 06/30/22 2041 ? enoxaparin (LOVENOX) injection 80 mg  80 mg Subcutaneous Q12H Abid, Bushra, MD   80 mg at 06/30/22 2040 ? famotidine (PEPCID) tablet 20 mg  20 mg Oral Daily Senaida Lange, MD     ? ferrous gluconate (FERGON) tablet 324 mg  324 mg Oral Every Other Day Larna Daughters, MD   324 mg at 06/29/22 0855 ? gabapentin (NEURONTIN) capsule 100 mg  100 mg Oral TID Marlon Pel, MD   100 mg at 06/30/22 2041 ? lidocaine (LIDODERM) 5 % 1 patch  1 patch Transdermal Q24H Jerral Ralph, MD   1 patch at 06/30/22 1617 ? melatonin tablet 3 mg  3 mg Oral Nightly Pryor Ochoa, MD   3 mg at 06/30/22 2041 ? nystatin (MYCOSTATIN) powder 1 Application  1 Application  Topical (Top) BID Henriette Combs, MD   1 Application  at 06/30/22 2042 ? predniSONE (DELTASONE) tablet 5 mg  5 mg Oral Daily Jerral Ralph, MD   5 mg at 06/30/22 1610 ? rosuvastatin (CRESTOR) tablet 10 mg  10 mg Per G Tube Nightly Dorisann Frames, MD   10 mg at 06/30/22 2041 ? sodium chloride 0.9 % flush 3 mL  3 mL IV Push Q8H Dorisann Frames, MD   3 mL at 05/26/22 0624 Continuous Infusions:PRN Meds:acetaminophen, dextrose (GLUCOSE) oral liquid 15 g **OR** fruit juice **OR** skim milk, dextrose (GLUCOSE) oral liquid 30 g **OR** fruit juice, dextrose injection, dextrose injectionObjective: Vitals: Temp:  [97.8 ?F (36.6 ?C)-98 ?F (36.7 ?C)] 98 ?F (36.7 ?C)Pulse:  [65-69] 65Resp:  [16-17] 17BP: (114-118)/(65-73) 114/73SpO2:  [99 %] 99 %Device (Oxygen Therapy): room air Weight: 75 kg I/O's: Intake/Output Summary (Last 24 hours) at 07/01/2022 0651Last data filed at 07/01/2022 0106Gross per 24 hour Intake 237 ml Output 975 ml Net -738 ml  Physical Exam:General:  in no acute distressHEENT: MMM, sclera anictericPulmonary: CTAB; normal respiratory effortCardiac: RRR, no M/R/GAbdomen: soft, non-tender, non-distended, no guarding or rebound; +BSNeuro: Alert and oriented x3; no gross deficitsExtremities: no LEE, contracted left foot/ankle with intact passive ROM but weakness in extension and eversion Labs: MetabolicRecent Labs Lab 07/07/230643 NA 139 K 3.8 CL 108* CO2 22 BUN 11 CREATININE 0.37* ANIONGAP 9 CALCIUM 10.5* MG 1.9  HematologicRecent Labs Lab 07/07/230644 WBC 7.7 HGB 11.4* HCT 36.80 PLT 278 NEUTROPHILS 60.3  HepaticNo results for input(s): BILITOT, BILIDIR, ALT, AST, ALKPHOS, PROT, ALBUMIN, PTT, LABPROT, INR in the last 168 hours. SpecialNo results for input(s): URICACID, LDH, FIBRINOGEN, DDIMER, FERRITIN, CRP in the last 168 hours.Invalid input(s): ESR Micro:No newDiagnostics:No newECG/Tele Events: No newAssessment & Plan: 66 yo conserved F with PMHx RA, multiple abdominal surgeries, chronic hypercalcemia with prolonged hospital stay at OSH and subsequent transfer to [facility] on 02/11/22 for encephalopathy and FTT, now resolved, but remains inpatient pending LTC, awaiting T19 process. #DispositionPending T-19  approval. Her sister [name]) is her gaurdian, but she has written letter to probate court to revise her guardianship. SW is informed about the situatuation.- f/u with CM/SW about her  Title-19 status#HypercalcemiaStable Ca at 10.5 [Alb 2.4]. Previously seen by Endocrine. 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

## 2022-07-01 NOTE — Plan of Care
Plan of Care Overview/ Patient Status    0730-1900: A&OX3, forgetful to situation. VSS on room air. C/o 7/10 pain BLE, tramadol ordered & administered with good effect. Lungs clear. Good PO intake. Incontinent of urine, purewick in place. Ostomy bag intact, +stool/flatus. Turned & repositioned in bed q2 hours, specialty bed in place, heels elevated off bed on pillows. See flowsheets for full assessment. Report given to on coming RN

## 2022-07-02 LAB — CBC WITH AUTO DIFFERENTIAL
BKR WAM ABSOLUTE IMMATURE GRANULOCYTES.: 0.01 x 1000/ÂµL (ref 0.00–0.30)
BKR WAM ABSOLUTE LYMPHOCYTE COUNT.: 1.98 x 1000/ÂµL (ref 0.60–3.70)
BKR WAM ABSOLUTE NRBC (2 DEC): 0 x 1000/ÂµL (ref 0.00–1.00)
BKR WAM ANALYZER ANC: 2.08 x 1000/ÂµL (ref 2.00–7.60)
BKR WAM BASOPHIL ABSOLUTE COUNT.: 0.06 x 1000/ÂµL (ref 0.00–1.00)
BKR WAM BASOPHILS: 1.2 % (ref 0.0–1.4)
BKR WAM EOSINOPHIL ABSOLUTE COUNT.: 0.43 x 1000/ÂµL (ref 0.00–1.00)
BKR WAM EOSINOPHILS: 8.5 % — ABNORMAL HIGH (ref 0.0–5.0)
BKR WAM HEMATOCRIT (2 DEC): 37.6 % (ref 35.00–45.00)
BKR WAM HEMOGLOBIN: 11.7 g/dL (ref 11.7–15.5)
BKR WAM IMMATURE GRANULOCYTES: 0.2 % (ref 0.0–1.0)
BKR WAM LYMPHOCYTES: 39.1 % (ref 17.0–50.0)
BKR WAM MCH (PG): 28.1 pg (ref 27.0–33.0)
BKR WAM MCHC: 31.1 g/dL (ref 31.0–36.0)
BKR WAM MCV: 90.2 fL (ref 80.0–100.0)
BKR WAM MONOCYTE ABSOLUTE COUNT.: 0.5 x 1000/ÂµL (ref 0.00–1.00)
BKR WAM MONOCYTES: 9.9 % (ref 4.0–12.0)
BKR WAM MPV: 10.8 fL (ref 8.0–12.0)
BKR WAM NEUTROPHILS: 41.1 % (ref 39.0–72.0)
BKR WAM NUCLEATED RED BLOOD CELLS: 0 % (ref 0.0–1.0)
BKR WAM PLATELETS: 265 x1000/ÂµL (ref 150–420)
BKR WAM RDW-CV: 17.2 % — ABNORMAL HIGH (ref 11.0–15.0)
BKR WAM RED BLOOD CELL COUNT.: 4.17 M/ÂµL (ref 4.00–6.00)
BKR WAM WHITE BLOOD CELL COUNT: 5.1 x1000/ÂµL (ref 4.0–11.0)

## 2022-07-02 LAB — BASIC METABOLIC PANEL
BKR ANION GAP: 9 (ref 7–17)
BKR BLOOD UREA NITROGEN: 14 mg/dL (ref 8–23)
BKR BUN / CREAT RATIO: 36.8 — ABNORMAL HIGH (ref 8.0–23.0)
BKR CALCIUM: 10.5 mg/dL — ABNORMAL HIGH (ref 8.8–10.2)
BKR CHLORIDE: 111 mmol/L — ABNORMAL HIGH (ref 98–107)
BKR CO2: 22 mmol/L (ref 20–30)
BKR CREATININE: 0.38 mg/dL — ABNORMAL LOW (ref 0.40–1.30)
BKR EGFR, CREATININE (CKD-EPI 2021): >60 mL/min/{1.73_m2} (ref >=60)
BKR GLUCOSE: 87 mg/dL (ref 70–100)
BKR POTASSIUM: 4.1 mmol/L (ref 3.3–5.3)
BKR SODIUM: 142 mmol/L (ref 136–144)

## 2022-07-02 LAB — SEDIMENTATION RATE (ESR): BKR SEDIMENTATION RATE, ERYTHROCYTE: 45 mm/h — ABNORMAL HIGH (ref 0–20)

## 2022-07-02 LAB — C-REACTIVE PROTEIN     (CRP): BKR C-REACTIVE PROTEIN, HIGH SENSITIVITY: 1.4 mg/L

## 2022-07-02 NOTE — Plan of Care
Plan of Care Overview/ Patient Status    1900 - 0700 A&Ox3-4. Disoriented to elements of situation. VSS on RA. Denied pain at rest, but reported severe pain to LLE w/movement. PRN 25mg  Tramadol given. 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 T&P'd as tolerated, pt often refuses. Appeared to rest comfortably overnight. Awaiting T19 & LTP. Hourly rounding performed and safety maintained. Please continue to follow POC. See flow sheets for complete assessment. Problem: Adult Inpatient Plan of CareGoal: Plan of Care ReviewOutcome: Interventions implemented as appropriateFlowsheets (Taken 07/02/2022 0340)Progress: no changePlan of Care Reviewed With: patient

## 2022-07-02 NOTE — Progress Notes
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 Interim Events: Yesterday: no clinical change, remains stable, discontinued multivitamins to reduce polypharmacy.  Overnight: NAEToday: reports feeling good. Denies any pain. Complain of shunting stool form to ileostomy to the rectum. No change in the color of stools. Meds: Scheduled Meds:Current Facility-Administered Medications Medication Dose Route Frequency Provider Last Rate Last Admin ? diclofenac (VOLTAREN) 1 % gel 4 g  4 g Topical (Top) 4x Daily Larna Daughters, MD   4 g at 07/02/22 0932 ? enoxaparin (LOVENOX) injection 80 mg  80 mg Subcutaneous Q12H Abid, Bushra, MD   80 mg at 07/02/22 0931 ? famotidine (PEPCID) tablet 20 mg  20 mg Oral Daily Senaida Lange, MD   20 mg at 07/02/22 0932 ? ferrous gluconate (FERGON) tablet 324 mg  324 mg Oral Every Other Day Larna Daughters, MD   324 mg at 07/01/22 1610 ? gabapentin (NEURONTIN) capsule 100 mg  100 mg Oral TID Marlon Pel, MD   100 mg at 07/02/22 0932 ? lidocaine (LIDODERM) 5 % 1 patch  1 patch Transdermal Q24H Jerral Ralph, MD   1 patch at 07/01/22 1939 ? melatonin tablet 3 mg  3 mg Oral Nightly Pryor Ochoa, MD   3 mg at 07/01/22 2212 ? nystatin (MYCOSTATIN) powder 1 Application  1 Application  Topical (Top) BID Henriette Combs, MD   1 Application  at 07/02/22 0932 ? predniSONE (DELTASONE) tablet 5 mg  5 mg Oral Daily Jerral Ralph, MD   5 mg at 07/02/22 0932 ? rosuvastatin (CRESTOR) tablet 10 mg  10 mg Per G Tube Nightly Dorisann Frames, MD   10 mg at 07/01/22 2212 ? sodium chloride 0.9 % flush 3 mL  3 mL IV Push Q8H Dorisann Frames, MD   3 mL at 05/26/22 0624 Continuous Infusions:PRN Meds:acetaminophen, dextrose (GLUCOSE) oral liquid 15 g **OR** fruit juice **OR** skim milk, dextrose (GLUCOSE) oral liquid 30 g **OR** fruit juice, dextrose injection, dextrose injection, traMADoLObjective: Vitals: Temp:  [97.6 ?F (36.4 ?C)-98.4 ?F (36.9 ?C)] 98.4 ?F (36.9 ?C)Pulse:  [62-82] 68Resp:  [18] 18BP: (97-104)/(57-67) 104/67SpO2:  [98 %-99 %] 99 %Device (Oxygen Therapy): room air Weight: 74.1 kg I/O's: Intake/Output Summary (Last 24 hours) at 07/02/2022 1139Last data filed at 07/02/2022 0600Gross per 24 hour Intake 237 ml Output 375 ml Net -138 ml  Physical Exam:General:  in no acute distressHEENT: MMM, sclera anictericPulmonary: CTAB; normal respiratory effortCardiac: RRR, no M/R/GAbdomen: soft, non-tender, non-distended, no guarding or rebound; +BSNeuro: Alert and oriented x3; no gross deficitsExtremities: no LEE, contracted left foot/ankle with intact passive ROM but weakness in extension and eversion Labs: MetabolicRecent Labs Lab 07/07/230643 07/12/230557 NA 139 142 K 3.8 4.1 CL 108* 111* CO2 22 22 BUN 11 14 CREATININE 0.37* 0.38* ANIONGAP 9 9 CALCIUM 10.5* 10.5* MG 1.9  --   HematologicRecent Labs Lab 07/07/230644 07/12/230557 WBC 7.7 5.1 HGB 11.4* 11.7 HCT 36.80 37.60 PLT 278 265 NEUTROPHILS 60.3 41.1  HepaticNo results for input(s): BILITOT, BILIDIR, ALT, AST, ALKPHOS, PROT, ALBUMIN, PTT, LABPROT, INR in the last 168 hours. SpecialNo results for input(s): URICACID, LDH, FIBRINOGEN, DDIMER, FERRITIN, CRP in the last 168 hours.Invalid input(s): ESR Micro:No newDiagnostics:No newECG/Tele Events: No newAssessment & Plan: 66  yo conserved F with PMHx RA, multiple abdominal surgeries, chronic hypercalcemia with prolonged hospital stay at OSH and subsequent transfer to [facility] on 02/11/22 for encephalopathy and FTT, now resolved, but remains inpatient pending LTC, awaiting T19 process. 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 Her sister [name]) is her gaurdian, but she has written letter to probate court to revise her guardianship. SW is informed about the situatuation.- f/u with CM/SW about her Title-19 status#HypercalcemiaStable Ca at 10.5 [Alb 2.4]. Previously seen by Endocrine. 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 Concern she will likely remain bed bound given prolonged course.She explained of new join in the shoulders and therefore ESR was ordered to see if this is remission of RA. 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

## 2022-07-03 NOTE — Plan of Care
Plan of Care Overview/ Patient Status    Medical Social Work Follow Up  [employer] Most Recent Value Admission Information  Document Type Progress Note (For Inpatient/ED Only) Prior psychosocial assessment has been documented within this hospitalization No (For Inpatient/ED Only) Prior psychosocial assessment has been documented within 30 days of this hospitalization No Reason for Current Social Work Involvement Support/Coping Source of Information Patient, Geophysicist/field seismologist, Medical Team Family/Caregiver Comment Public librarian Medical Team Comment Care Coordination Record Reviewed Yes Level of Care Inpatient Psychosocial issues requiring intervention Support Psychosocial interventions 60 minutes spent on the phone with Jacquenette Shone and face to face with Minsa. I collaborated with Futures trader, Brenton Grills. and spoke with [name] to clarify MD's note reporting plans to revise the Conservatorship. [name] informed that she will not be sending a letter to the court to revise the Conservatorship. She stated that she has an appointment with the funeral home to make arrangements for a spend down of funds in order to meet eligibility requirements for Title 19. I did inform [name] that Caelen was requesting her personal belongings. I then met with Mena to inform her of the discussion with [name]. Sharron stated that she would like to leave the Conservatorship in place until Booker completes the Title 19 process. Shantella expressed that she feels that Conservatorship after the process is complete is not necessary. She stated that she has always managed her own finances and made her own decisions. I provided support and validated her feelings. I then spoke with [name] again who informed that she had spoken to the court regarding giving Emanuel her personal belonging, such as her credit cards and bank card. She reported that she was told by the court that she CANNOT provide these items to [employer] because she is the conservator of estate and is therefore responsible for all funds. [name] informed that she will direct all her questions to the court to ensure all finances are handled appropriately. I then informed Marielle of the information provided to me by [name] per the court. Jesslyn informed that she was not happy about the belongings being held from her, but that she understands. I educated her on her ability to advocate for herself by writing a letter to the court, informing her that she is appointed her own council by the probate court, and that she has the right to attend all hearings and speak for herself. Nealy appreciated my support and I thanked her for her time. Collaborations Care Manager, Jacquenette Shone Specific referrals to enhance community supports (include existing and new resources) None Handoff Required? No Next Steps/Plan (including hand-off): I will continue to remain involved for support as needed. Signature: Leim Fabry, LCSW Contact Information: [phone]

## 2022-07-03 NOTE — Plan of Care
Plan of Care Overview/ Patient Status    1900-0700: Pt A&Ox4.?VSS on RA. Pt c/o pain to left ankle with movement, scheduled Voltaren cream applied . Denies SOB, CP, N/V/D.?Right side ileostomy +flatus and stool.??Pt incontinent of urine, prn care provided,?Purewick in place and changed this shift.?Old peg site, dressing?clean dry and intact.?Good PO intake.?BLE elevated?with pillows.?Hourly rounding.?T&Rq2h. Pt resting in bed. Safety precautions maintained, bed alarm on, call bell in reach, will ctm and follow nursing poc.Problem: Adult Inpatient Plan of CareGoal: Plan of Care ReviewOutcome: Interventions implemented as appropriateFlowsheets (Taken 07/03/2022 0419)Progress: no changePlan of Care Reviewed With: patient

## 2022-07-03 NOTE — Plan of Care
Plan of Care Overview/ Patient Status    0700-1900Pt is A&Ox3-4. Disoriented to situation, at times. VSS on R.A. No c/o pain/n/v. Only pain when moving the patient coming from LLE (ankle). Ileostomy in place, outputting thick mushy stool. T&R q2hrs. Doesn't get OOB. Safety maintained w/hourly rounding, call bell within reach, and bed in lowest position.Bag was changed during this shift. Problem: Adult Inpatient Plan of CareGoal: Plan of Care ReviewOutcome: Interventions implemented as appropriateGoal: Patient-Specific Goal (Individualized)Outcome: Interventions implemented as appropriateGoal: Absence of Hospital-Acquired Illness or InjuryOutcome: Interventions implemented as appropriateGoal: Optimal Comfort and WellbeingOutcome: Interventions implemented as appropriate

## 2022-07-03 NOTE — Progress Notes
[facility]	 [facility]	Medicine Progress NoteAttending Provider: Gaspar Bidding, MDDate of Admission: 2/21/2023Patient Summary: 66 yo conserved F with PMHx RA, multiple abdominal surgeries, chronic hypercalcemia with prolonged hospital stay at OSH and subsequent transfer to [facility] on 02/11/22 for encephalopathy and FTT, now resolved, but remains inpatient pending LTC, awaiting T19 process. Interim Events: Yesterday: no clinical change, remains stable, discontinued multivitamins to reduce polypharmacy. Overnight: NAEToday: reports feeling good. Denies any joint or abdominal pain. Stool passage through ileostomy with no change in color.Meds: Scheduled Meds:Current Facility-Administered Medications Medication Dose Route Frequency Provider Last Rate Last Admin ? diclofenac (VOLTAREN) 1 % gel 4 g  4 g Topical (Top) 4x Daily Soleymanjahi, Saeed, MD   4 g at 07/03/22 1156 ? enoxaparin (LOVENOX) injection 80 mg  80 mg Subcutaneous Q12H Abid, Bushra, MD   80 mg at 07/03/22 0851 ? famotidine (PEPCID) tablet 20 mg  20 mg Oral Daily Senaida Lange, MD   20 mg at 07/03/22 1610 ? ferrous gluconate (FERGON) tablet 324 mg  324 mg Oral Every Other Day Larna Daughters, MD   324 mg at 07/03/22 9604 ? gabapentin (NEURONTIN) capsule 100 mg  100 mg Oral TID Marlon Pel, MD   100 mg at 07/03/22 1632 ? lidocaine (LIDODERM) 5 % 1 patch  1 patch Transdermal Q24H Jerral Ralph, MD   1 patch at 07/03/22 1632 ? melatonin tablet 3 mg  3 mg Oral Nightly Pryor Ochoa, MD   3 mg at 07/02/22 2237 ? nystatin (MYCOSTATIN) powder 1 Application  1 Application  Topical (Top) BID Henriette Combs, MD   1 Application  at 07/03/22 [phone] ? predniSONE (DELTASONE) tablet 5 mg  5 mg Oral Daily Jerral Ralph, MD   5 mg at 07/03/22 8119 ? rosuvastatin (CRESTOR) tablet 10 mg  10 mg Per G Tube Nightly Dorisann Frames, MD   10 mg at 07/02/22 2237 ? sodium chloride 0.9 % flush 3 mL  3 mL IV Push Q8H Dorisann Frames, MD   3 mL at 05/26/22 0624 Continuous Infusions:PRN Meds:acetaminophen, dextrose (GLUCOSE) oral liquid 15 g **OR** fruit juice **OR** skim milk, dextrose (GLUCOSE) oral liquid 30 g **OR** fruit juice, dextrose injection, dextrose injection, traMADoLObjective: Vitals: Temp:  [97.3 ?F (36.3 ?C)-98 ?F (36.7 ?C)] 97.9 ?F (36.6 ?C)Pulse:  [73-85] 85Resp:  [18-20] 20BP: (99-106)/(63-65) 99/63SpO2:  [97 %] 97 %Device (Oxygen Therapy): room air Weight: 74.1 kg I/O's: Intake/Output Summary (Last 24 hours) at 07/03/2022 1735Last data filed at 07/03/2022 1600Gross per 24 hour Intake 474 ml Output 800 ml Net -326 ml  Physical Exam:General:  in no acute distressHEENT: MMM, sclera anictericPulmonary: CTAB; normal respiratory effortCardiac: RRR, no M/R/GAbdomen: soft, non-tender, non-distended, no guarding or rebound; +BSNeuro: Alert and oriented x3; no gross deficitsExtremities: no LEE, contracted left foot/ankle with intact passive ROM but weakness in extension and eversion Labs: MetabolicRecent Labs Lab 07/07/230643 07/12/230557 NA 139 142 K 3.8 4.1 CL 108* 111* CO2 22 22 BUN 11 14 CREATININE 0.37* 0.38* ANIONGAP 9 9 CALCIUM 10.5* 10.5* MG 1.9  --   HematologicRecent Labs Lab 07/07/230644 07/12/230557 WBC 7.7 5.1 HGB 11.4* 11.7 HCT 36.80 37.60 PLT 278 265 NEUTROPHILS 60.3 41.1  HepaticNo results for input(s): BILITOT, BILIDIR, ALT, AST, ALKPHOS, PROT, ALBUMIN, PTT, LABPROT, INR in the last 168 hours. SpecialNo results for input(s): URICACID, LDH, FIBRINOGEN, DDIMER, FERRITIN, CRP in the last 168 hours.Invalid input(s): ESR Micro:No newDiagnostics:No newECG/Tele Events: No newAssessment & Plan: 66 yo conserved F with PMHx RA, multiple abdominal  surgeries, chronic hypercalcemia with prolonged hospital stay at OSH and subsequent transfer to [facility] on 02/11/22 for encephalopathy and FTT, now resolved, but remains inpatient pending LTC, awaiting T19 process. #DispositionPending T-19 approval. Her sister [name]) is her gaurdian, but she has written letter to probate court to revise her guardianship. 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 Concern she will likely remain bed bound given prolonged course.She explained of new join in the shoulders and therefore ESR was ordered to see if this is remission of RA. ESR was 45- cont PT x3 times/weekOld#Shunting of stool from ileostomy to the rectumNo history or report of shunt of stool from ileostomy to the rectum#HypercalcemiaStable Ca at 10.5 [Alb 2.4]. Previously seen by Endocrine. 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

## 2022-07-03 NOTE — Plan of Care
Inpatient Occupational Therapy Progress NoteDefault Flowsheet Data (most recent)   IP Adult OT Eval/Treat - 07/03/22 1604    Date of Visit / Treatment  Date of Visit / Treatment 07/03/22   Note Type Progress Note   Progress Report Due 07/21/22   End Time 1604    General Information  Subjective It is that I can't move that leg   General Observations Pt received supine in bed on 9-7, RA, NAD, RN cleared, Pt agreeable   Precautions/Limitations Fall Precautions;Bed alarm;Chair alarm    Vital Signs and Orthostatic Vital Signs  Vital Signs Vital Signs Stable   Vital Signs Free text Per chart, RA, NAD    Pain/Comfort  Pain Comment (Pre/Post Treatment Pain) Endporsing unquantified pain w/ movement of BLE. RN aware. Pt repositioned for comfort at end of session    Cognition  Overall Cognitive Status Impaired   Orientation Level Oriented X4   Level of Consciousness alert   Following Commands Follows one step commands with repetition   Personal Safety / Judgment Fall risk;Requires supervision with mobility;Requires supervision for safety cognitively;Decreased recognition / insight of own deficits;Poor safety awareness   Attention Requires redirection;Requires cues   Tangential  Problem Solving Simple impairment   Cognition Comments Pt presenting with imapired command following requiring repetition for simple one step commands. Decreased attention to therapy tasks with adherence following minimal verbal cues. Pt tangential at times and redirectable with cues    Peripheral Neurovascular  Edema  none present    Sensory Assessment  Sensory Assessment Comments Hpersensitivity in BLE.    Skin Assessment  Skin Assessment See Nursing Documentation    Posture, Head/Trunk Alignment  Posture, Head/Trunk Alignment Right lateral lean    Balance  Sitting Balance: Static  POOR+    Minimal assist to maintain static position with no Assistive Device   Sitting Balance: Dynamic  POOR+   Moves through 1/2 range with minimal assist to right self    BADL Retraining  BADL Retraining Detailed Documentation Lower Body Dressing    Lower Body Dressing  Independence/Assistance Level Maximum assist;Assist of 1   Assistive Device No device   Clothing Items hospital socks   Lower Body Dressing Comments Supine in bed, Pt assisting with lifting BLE for donning. Unable to reach to pull up socks.     AM-PAC - Daily Activity IP Short Form  Help needed from another person putting on/taking off regular lower body clothing 2 - A Lot   Help needed from another person for bathing (incl. washing, rinsing, drying) 2 - A Lot   Help needed from another person for toileting (incl. using toilet, bedpan, urinal) 1 - Unable   Help needed from another person putting on/taking off regular upper body clothing 2 - A Lot   Help needed from another person taking care of personal grooming such as brushing teeth 4 - None   Help needed from another person eating meals 4 - None   AM-PAC Daily Activity Raw Score (Total of rows above) 15   CMS Score (based on Raw Score - with G Code) 15 - 56.46% impaired      (G Code - CK)    Bed Mobility  Rolling/Turning Right - Independence/Assistance Level Moderate assist;Assist of 1   Rolling/Turning Left - Independence/Assistance Level Moderate assist;Assist of 1   Rolling/Turning Assist Device Bed rails   Supine-to-Sit Independence/Assistance Level Maximum assist;Assist of 1   Supine-to-Sit Assist Device Hand held assist;Head of bed elevated   Sit-to-Supine Independence/Assistance Level  Maximum assist;Assist of 1   Sit-to-Supine Assist Device Hand held assist;Head of bed elevated   Bed Mobility Comments Pt presenting with R lateral lean in seated. Improved to maintain seated balance with CG for prolonged seated trial. Able to reach out of BOS and return to midline with verbal cues and encouragement. Museum/gallery curator Comments Unable.    Handoff Documentation  Handoff Patient in bed;Bed alarm;Patient instructed to call nursing for mobility;Discussed with nursing   Handoff Comments Repositioned for neutral pelvic    Endurance  Endurance Comments Poor +    Neuromuscular Re-education  Neuromuscular Re-education Comments static/dynamic balance, weight bearing BLE    Therapeutic Functional Activity  Therapeutic Functional Activity Comments bed mobility, functional mobility, activity tolerance, repositioning, cognitive retraining, LB dressing, Pt education    Clinical Impression  Follow up Assessment Pt seen today for f/u OT session. Pt moving to EOB w/ max A x1 able to maintain EOB seat w/ CG. Pt requiring max A for LB dressing 2/2 impaired mobility and strength. Pt would benefit from continued OT services. Pending medical progress, rec d/c to STR to maximize rehab potential.   Criteria for Skilled Therapeutic Interventions Met yes;treatment indicated   Rehab Potential fair, will monitor progress closely    Frequency/Equipment Recommendations  OT Frequency 2x per week   What day of week is next treatment expected? Tuesday   7/18  OT/OTA completing this assessment MH    Planned Treatment / Interventions  Plan for Next Visit POC   General Treatment / Interventions Activities of Daily Living   Training Treatment / Interventions Functional Mobility Training   Education Treatment / Interventions Patient Education / Training    OT Discharge Summary  Disposition Recommendation Short Term Rehab     Carlton Adam OTR/LMHB: [phone]

## 2022-07-03 NOTE — Progress Notes
Generalist Attending Brief Progress Note:I have seen and examined Danielle [name] on 07/03/22 at 12:00 pm.  Pt reports overall pain improved b/c had a new hospital bed. Worked with PT.SW note reviewed re guardianshipVitals: afebrileGen: elderly female lying in NAD, alert and oriented x 3HEENT: poor dentitionLungs: CTA anteriorlyCV: regularAbd: colostomy bag, multiple well healed incisionsExt: no ulcers, knees with no erythemaLabs:Ca: 10.5CBC (7/12): 8.5% eosinophilsAP:66 yo with hx RA, multiple abdominal surgeries, chronic hypercalcemia with prolonged hospital stay at OSH with subsequent transfer to [facility] on 02/11/22 for encephalopathy and FTT (now resolved). Hospital course c/b asymptomatic hypercalcemia a/t prolonged immobility vs primary hyperpara, prolonged immobility with contractures, chronic polyneuropathy.  Remains inpatient pending T19 approval and transfer to LTC.

## 2022-07-03 NOTE — Plan of Care
Plan of Care Overview/ Patient Status    Problem: Adult Inpatient Plan of CareGoal: Readiness for Transition of CareOutcome: Interventions implemented as appropriate CM and SW, Francesca P, called pt's sister/conservator, [name], via phone this morning to get update and clarification on a revision letter that was documented by MD.  [name] states she has gotten so far with having pt placed and she is not submitted a letter to have conservatorship revised.  [name] has an appointment with funeral home on Wednesday to have insurance policy funds 'spent down' so pt will be eligible for T19 and placement.  Per [name], pt's case worker Carola Frost, states pt's t19 should be approved by end of this month.  CM email was given and [name] will send paperwork via email.Care Manager will continue to follow with team.Sharlena Kristensen Hinda Lenis, MSW/CMCare ManagementYale Lifecare [phone]

## 2022-07-04 NOTE — Plan of Care
Plan of Care Overview/ Patient Status    0700-1900Pt is A&Ox3-4. Disoriented to situation, at times. VSS on R.A. No c/o pain/n/v. Only pain when moving the patient coming from LLE (ankle) Tramadol given. Ileostomy in place, outputting thick mushy stool. T&R q2hrs. Doesn't get OOB. Safety maintained w/hourly rounding, call bell within reach, and bed in lowest position. Bag was changed during this shift. Problem: Adult Inpatient Plan of CareGoal: Plan of Care ReviewOutcome: Interventions implemented as appropriateGoal: Patient-Specific Goal (Individualized)Outcome: Interventions implemented as appropriateGoal: Absence of Hospital-Acquired Illness or InjuryOutcome: Interventions implemented as appropriateGoal: Optimal Comfort and WellbeingOutcome: Interventions implemented as appropriate

## 2022-07-04 NOTE — Plan of Care
Danielle [name]					Location: EP [id number] y.o., female				Attending: Gaspar Bidding, MD	Admit Date: 02/11/2022			[name] LOS: 143 days FOLLOW-UP NUTRITION ASSESSMENT Dietary Orders (From admission, onward)     Start     Ordered  06/27/22 1404  Nutrition Supplements  (Nutrition Supplement Panel)  Once      Comments: send TID with meals. Question Answer Comment Adult Supplements: Ensure Clear [facility])  Supplement Frequency: TID    06/27/22 1403  06/20/22 1045  Diet Regular  DIET EFFECTIVE NOW      Question:  Initiate Nutrition Management Protocol (Yes/No?)  Answer:  Yes - Initiate Protocol  06/20/22 1045    No Known Allergies ANTHROPOMETRICS:Height: 70Admit wt: 79.4 kg (per SNF documentation)Current wt: 74.1 kg (bed, 7/11); BMI: 23.44 kg/m^2Dosing weight: 73.5 kg (based on current wt)?Additional wt information:Ht confirmed with pt. No wt hx since 2019 per EMR. Wts have fluctuated since admit, likely r/t scale use vs fluid shifts vs some true wt change. Current wt down 5.9 kg (7.4%) from admit wt x4.5 months, not significant for timeframe. Noted wt up 1.7 kg over the past 2 weeks. 1-2+ edema noted per RN flowsheets. No muscle/fat wasting observed. Will continue to trend wts as available.?  Wt: 06/24/22 73.5 kg 12/13/18 98.6 kg ?ESTIMATED NUTRITION REQUIREMENTS:Kcal/day: 1838           (25 kcal/kg)Protein/day: 74-88     (1-1.2 gm/kg)Fluids/day: 1838?mL/d?(1 mL/kcal) ONLY as medical and fluid status allows and only per team discretion.Needs based on: dosing wt (73.5 kg), maintenance, steroid?NUTRITION ASSESSMENT: PT EMR reviewed for follow up assessment. Pt continues to tolerate po intake very well with increased effort and support. Per review of calorie counts appears to be meeting needs well. Drinking oral supplements consistently with encouragement. ?Cultural/Religious/Ethnic Needs: NoFood Allergies: None per pt?NUTRITION DIAGNOSIS:Severe malnutrition r/t depletion aeb moderate muscle and fat depletion. ?INTERVENTIONS/RECOMMENDATIONS: Meals and Snacks:-Continue Regular diet- Encourage small frequent meals/snacks- Encourage high kcal/protein foods- May continue kcal counts per team, will reassess at f/u if applicable?Goal:- Pt will meet >60% of estimated needs to d/c enteral nutrition - meeting >60% of needs per kcal count will continue- Pt will consume >/=1 protein rich food with each meal- met goal will continue?Nutrition Supplement Therapy:- Continue Ensure Clear Berry BID per pt request- Continue Magic Cup Vanilla 1x daily - Continue [employer] Jello Cherry 1x daily?Goal: Pt to consume 2-3 Ensure Clear daily -sometimes met, will change to 2-3 ONS/day- met - continue?Nutrition Education:- Discussed food sources of protein- Discussed foods to help thicken ostomy output?Discharge Planning and Transfer of Nutrition Care:  Nutrition related discharge needs still being determined at this time, will continue to follow?MONITORING / EVALUATION:?Food/Nutrition-Related History:Food and Nutrient Intake?Anthropometric Measurements:Weight ?Biochemical Data, Medical Tests and Procedures:Electrolyte and renal profileGlucose/endocrine profile?Nutrition-Focus Physical FindingsOverall findingsElectronically signed by Sherrilee Gilles, RD, July 14, 2023MHB- [phone] Please note: Nutrition is a consult only service on weekends and holidays.  Please enter a consult in EPIC if assistance is needed on a weekend or holiday or via MHB [phone] to contact the covering RD.

## 2022-07-04 NOTE — Plan of Care
Plan of Care Overview/ Patient Status    0730-1900: A&OX3, d/o to situation. VSS on room air. C/o pain ble this shift, prn tylenol & tramadol administered with good effect. Lungs clear. Good PO intake. Incontinent of urine, care provided, purewick in place. Ostomy bag intact, +stool/flatus. Specialty bed in place. Turned & repositioned in bed q2 hours. Pt intermittently refusing for heels to be elevated off bed, pt educated on skin injury prevention. Dressings changed to wounds L side abd, see flowsheets. Report given to on coming RN.Problem: Adult Inpatient Plan of CareGoal: Plan of Care ReviewOutcome: Interventions implemented as appropriateFlowsheets (Taken 07/04/2022 1113)Progress: no changePlan of Care Reviewed With: patient Problem: Adult Inpatient Plan of CareGoal: Patient-Specific Goal (Individualized)Outcome: Interventions implemented as appropriateFlowsheets (Taken 07/04/2022 0900)What Anxieties, Fears, Concerns or Questions Do You Have About Your Care?: none statedIndividualized Preferences and Care Needs: updates on pocPatient Centered Long Term Goal: safe d/cPatient Centered Daily Goal: safety

## 2022-07-04 NOTE — Progress Notes
[facility]	 [facility]	Medicine Progress NoteAttending Provider: Gaspar Bidding, MDDate of Admission: 2/21/2023Patient Summary: 66 yo conserved F with PMHx RA, multiple abdominal surgeries, chronic hypercalcemia with prolonged hospital stay at OSH and subsequent transfer to [facility] on 02/11/22 for encephalopathy and FTT, now resolved, but remains inpatient pending LTC, awaiting T19 process. Interim Events: Yesterday: no clinical change, remains stableOvernight: NAEToday: reports feeling good. Denies any joint or abdominal pain. Stool passage through ileostomy with no change in color. Ordered CMP and CBC for tomorrow.Meds: Scheduled Meds:Current Facility-Administered Medications Medication Dose Route Frequency Provider Last Rate Last Admin ? diclofenac (VOLTAREN) 1 % gel 4 g  4 g Topical (Top) 4x Daily Larna Daughters, MD   4 g at 07/04/22 0916 ? enoxaparin (LOVENOX) injection 80 mg  80 mg Subcutaneous Q12H Abid, Bushra, MD   80 mg at 07/04/22 0913 ? famotidine (PEPCID) tablet 20 mg  20 mg Oral Daily Senaida Lange, MD   20 mg at 07/04/22 0913 ? ferrous gluconate (FERGON) tablet 324 mg  324 mg Oral Every Other Day Larna Daughters, MD   324 mg at 07/03/22 8295 ? gabapentin (NEURONTIN) capsule 100 mg  100 mg Oral TID Marlon Pel, MD   100 mg at 07/04/22 0913 ? lidocaine (LIDODERM) 5 % 1 patch  1 patch Transdermal Q24H Jerral Ralph, MD   1 patch at 07/03/22 1632 ? melatonin tablet 3 mg  3 mg Oral Nightly Pryor Ochoa, MD   3 mg at 07/03/22 2033 ? nystatin (MYCOSTATIN) powder 1 Application  1 Application  Topical (Top) BID Henriette Combs, MD   1 Application  at 07/03/22 2039 ? predniSONE (DELTASONE) tablet 5 mg  5 mg Oral Daily Jerral Ralph, MD   5 mg at 07/04/22 0913 ? rosuvastatin (CRESTOR) tablet 10 mg  10 mg Per G Tube Nightly Dorisann Frames, MD   10 mg at 07/03/22 2033 ? sodium chloride 0.9 % flush 3 mL  3 mL IV Push Q8H Dorisann Frames, MD   3 mL at 05/26/22 0624 Continuous Infusions:PRN Meds:acetaminophen, dextrose (GLUCOSE) oral liquid 15 g **OR** fruit juice **OR** skim milk, dextrose (GLUCOSE) oral liquid 30 g **OR** fruit juice, dextrose injection, dextrose injection, traMADoLObjective: Vitals: Temp:  [97.8 ?F (36.6 ?C)-97.9 ?F (36.6 ?C)] 97.8 ?F (36.6 ?C)Pulse:  [63-85] 65Resp:  [18-20] 18BP: (99-108)/(63-70) 106/69SpO2:  [97 %-99 %] 99 %Device (Oxygen Therapy): room air Weight: 74.1 kg I/O's: Intake/Output Summary (Last 24 hours) at 07/04/2022 1420Last data filed at 07/04/2022 0900Gross per 24 hour Intake 474 ml Output 1200 ml Net -726 ml  Physical Exam:General:  in no acute distressHEENT: MMM, sclera anictericPulmonary: CTAB; normal respiratory effortCardiac: RRR, no M/R/GAbdomen: soft, non-tender, non-distended, no guarding or rebound; +BSNeuro: Alert and oriented x3; no gross deficitsExtremities: no LEE, contracted left foot/ankle with intact passive ROM but weakness in extension and eversion Labs: MetabolicRecent Labs Lab 07/12/230557 NA 142 K 4.1 CL 111* CO2 22 BUN 14 CREATININE 0.38* ANIONGAP 9 CALCIUM 10.5*  HematologicRecent Labs Lab 07/12/230557 WBC 5.1 HGB 11.7 HCT 37.60 PLT 265 NEUTROPHILS 41.1  HepaticNo results for input(s): BILITOT, BILIDIR, ALT, AST, ALKPHOS, PROT, ALBUMIN, PTT, LABPROT, INR in the last 168 hours. SpecialNo results for input(s): URICACID, LDH, FIBRINOGEN, DDIMER, FERRITIN, CRP in the last 168 hours.Invalid input(s): ESR Micro:No newDiagnostics:No newECG/Tele Events: No newAssessment & Plan: 66 yo conserved F with PMHx RA, multiple abdominal surgeries, chronic hypercalcemia with prolonged hospital stay at OSH and subsequent transfer to [facility] on 02/11/22 for encephalopathy and  FTT, now resolved, but remains inpatient pending LTC, awaiting T19 process. #DispositionPending T-19 approval. Her sister [name]) is her gaurdian, but she has written letter to probate court to revise her guardianship. SW is informed about the situatuation. Had arrangements for a spend down of funds in order to meet eligibility requirements for her Title 76. 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 Concern she will likely remain bed bound given prolonged course.She explained of new join in the shoulders and therefore ESR was ordered to see if this is remission of RA. ESR was 45- cont PT x3 times/weekOld#Shunting of stool from ileostomy to the rectumNo history or report of shunt of stool from ileostomy to the rectum#HypercalcemiaStable Ca at 10.5 [Alb 2.4]. Previously seen by Endocrine. 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

## 2022-07-04 NOTE — Plan of Care
Plan of Care Overview/ Patient Status    1900-0700: A&Ox4. VSS on RA. Complaint of pain with movement, scheduled cream applied per order. Regular diet. On calorie count. Ileostomy in place, +flatus, +stool. Incontinent of urine, purewick in place, draining yellow urine. Skin assessment documented in flowsheet, old peg tube site dressing intact. Ax2 in bed. Monitoring vitals daily. No acute events this shift. Safety maintained. Call bell in reach. Bed alarm on. Hourly rounding completed.Problem: Adult Inpatient Plan of CareGoal: Plan of Care ReviewOutcome: Interventions implemented as appropriateGoal: Patient-Specific Goal (Individualized)Outcome: Interventions implemented as appropriateGoal: Absence of Hospital-Acquired Illness or InjuryOutcome: Interventions implemented as appropriateGoal: Optimal Comfort and WellbeingOutcome: Interventions implemented as appropriateGoal: Readiness for Transition of CareOutcome: Interventions implemented as appropriate Problem: InfectionGoal: Absence of Infection Signs and SymptomsOutcome: Interventions implemented as appropriate Problem: Physical Therapy GoalsGoal: Physical Therapy GoalsDescription: PT GOALS - addressed 05/02/22.1. Patient will demonstrate active range of motion x 4 extremities in preparation for mobility (GOAL MET)2. Caregivers will be independent and safely implement a range of motion/positioning program to prevent loss of range of motion and skin integrity (ONGOING)3. Patient will perform bed mobility with maximal assist (GOAL MET)4. Patient will tolerate sitting edge of bed >5 minutes with minimal assist (GOAL MET)5. Patient will perform transfers with moderate assist of 2 using rolling walker (ONGOING)6. Pt will perform bed mobility w/ modAx1 (ONGOING)7. Pt will perform STS transfer w/ modAx2 for a standing tolerance of 1 minute. (NEW) Outcome: Interventions implemented as appropriate Problem: Occupational Therapy GoalsGoal: Occupational Therapy GoalsDescription: OT goals1. Pt will move to EOB for seated ADL mod I2. Pt will follow 3/4 simple one step motor commands IND3. Pt will sequence through set up ADL task INDOT re-eval, Initial goals not met, continue to progress towards initial goalsOutcome: Interventions implemented as appropriate Problem: Impaired Wound HealingGoal: Optimal Wound HealingOutcome: Interventions implemented as appropriate Problem: Fall Injury RiskGoal: Absence of Fall and Fall-Related InjuryOutcome: Interventions implemented as appropriate Problem: Skin Injury Risk IncreasedGoal: Skin Health and IntegrityOutcome: Interventions implemented as appropriate Problem: Mobility ImpairmentGoal: Optimal MobilityOutcome: Interventions implemented as appropriate Problem: Pain AcuteGoal: Acceptable Pain Control and Functional AbilityOutcome: Interventions implemented as appropriate Problem: Speech Language Pathology GoalsGoal: Dysphagia Goals, SLPDescription: Current goal: Patient will tolerate regular solids and thin liquids without adverse impact on respiratory status and while maintaining adequate oral nutrition.Prior goal (met): The patient will tolerate least restrictive diet without any decline in respiratory status.Prior goal (met): Pt will tolerate pureed diet with thin liquids w/o s/x of aspiration or adverse impact on respiratory status.Outcome: Interventions implemented as appropriate Problem: Self-Care DeficitGoal: Improved Ability to Complete Activities of Daily LivingOutcome: Interventions implemented as appropriate Problem: Fluid, Electrolyte and Nutrition Imbalance (Ileostomy)Goal: Fluid, Electrolyte and Nutrition BalanceOutcome: Interventions implemented as appropriate Problem: Infection (Ileostomy)Goal: Absence of Infection Signs and SymptomsOutcome: Interventions implemented as appropriate Problem: Confusion ChronicGoal: Optimal Cognitive FunctionOutcome: Interventions implemented as appropriate

## 2022-07-05 NOTE — Plan of Care
Plan of Care Overview/ Patient Status    0700-1900A&O: x3-4, not fully grasping medical status Cardiac: WDLResp: WDL on room airGI/GU: ileostomy. Incontinent of urine, purwick in place Diet: regularSkin: intact, scarring from pt scratching, sarna lotion applied Pain: left ankle/foot pain see MAR for interventions Mobility: total. Left foot drop. Upper extremity movement intactAccess: nonePills: whole Safety maintained, call bell in reach, bed alarm set, rounding continued.

## 2022-07-05 NOTE — Progress Notes
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 Interim Events: Yesterday: no clinical change, remains stableOvernight: NAEToday: reports slept well and is feeling good. Denies any joint or abdominal pain. Was eating breakfast at time of examination. Meds: Scheduled Meds:Current Facility-Administered Medications Medication Dose Route Frequency Provider Last Rate Last Admin ? diclofenac (VOLTAREN) 1 % gel 4 g  4 g Topical (Top) 4x Daily Larna Daughters, MD   4 g at 07/04/22 2041 ? enoxaparin (LOVENOX) injection 80 mg  80 mg Subcutaneous Q12H Abid, Bushra, MD   80 mg at 07/04/22 2039 ? famotidine (PEPCID) tablet 20 mg  20 mg Oral Daily Senaida Lange, MD   20 mg at 07/04/22 0913 ? ferrous gluconate (FERGON) tablet 324 mg  324 mg Oral Every Other Day Larna Daughters, MD   324 mg at 07/03/22 1610 ? gabapentin (NEURONTIN) capsule 100 mg  100 mg Oral TID Marlon Pel, MD   100 mg at 07/04/22 2040 ? lidocaine (LIDODERM) 5 % 1 patch  1 patch Transdermal Q24H Jerral Ralph, MD   1 patch at 07/04/22 1532 ? melatonin tablet 3 mg  3 mg Oral Nightly Pryor Ochoa, MD   3 mg at 07/04/22 2040 ? nystatin (MYCOSTATIN) powder 1 Application  1 Application  Topical (Top) BID Henriette Combs, MD   1 Application  at 07/04/22 2041 ? predniSONE (DELTASONE) tablet 5 mg  5 mg Oral Daily Jerral Ralph, MD   5 mg at 07/04/22 0913 ? rosuvastatin (CRESTOR) tablet 10 mg  10 mg Per G Tube Nightly Dorisann Frames, MD   10 mg at 07/04/22 2040 ? sodium chloride 0.9 % flush 3 mL  3 mL IV Push Q8H Dorisann Frames, MD   3 mL at 05/26/22 0624 Continuous Infusions:PRN Meds:acetaminophen, dextrose (GLUCOSE) oral liquid 15 g **OR** fruit juice **OR** skim milk, dextrose (GLUCOSE) oral liquid 30 g **OR** fruit juice, dextrose injection, dextrose injection, traMADoLObjective: Vitals: Temp:  [97.8 ?F (36.6 ?C)-97.9 ?F (36.6 ?C)] 97.9 ?F (36.6 ?C)Pulse:  [65-68] 68Resp:  [18] 18BP: (101-106)/(67-69) 101/67SpO2:  [98 %-99 %] 98 %Device (Oxygen Therapy): room air Weight: 74.1 kg I/O's: Intake/Output Summary (Last 24 hours) at 07/05/2022 0652Last data filed at 07/05/2022 0546Gross per 24 hour Intake 237 ml Output 650 ml Net -413 ml  Physical Exam:General:  in no acute distressHEENT: MMM, sclera anictericPulmonary: CTAB; normal respiratory effortCardiac: RRR, no M/R/GAbdomen: soft, non-tender, non-distended, no guarding or rebound; +BSNeuro: Alert and oriented x3; no gross deficitsExtremities: no LEE, contracted left foot/ankle with intact passive ROM but weakness in extension and eversion Labs: MetabolicRecent Labs Lab 07/12/230557 NA 142 K 4.1 CL 111* CO2 22 BUN 14 CREATININE 0.38* ANIONGAP 9 CALCIUM 10.5*  HematologicRecent Labs Lab 07/12/230557 WBC 5.1 HGB 11.7 HCT 37.60 PLT 265 NEUTROPHILS 41.1  HepaticNo results for input(s): BILITOT, BILIDIR, ALT, AST, ALKPHOS, PROT, ALBUMIN, PTT, LABPROT, INR in the last 168 hours. 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 #DispositionPending T-19 approval. Her sister [name]) is her gaurdian, but she has written letter to probate court to revise her guardianship. SW is informed about the situatuation. Had arrangements for a spend down of funds in order to meet eligibility requirements for her Title 48. 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 Concern she will likely remain bed bound given prolonged course.She explained of new join in the shoulders and therefore ESR was ordered to see if this is remission of RA. ESR was 45- cont PT x3 times/weekOld#HypercalcemiaStable Ca at 10.5 [Alb 2.4]. Previously seen by Endocrine. 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

## 2022-07-05 NOTE — Plan of Care
Plan of Care Overview/ Patient Status    RN 7P-7A Pt A&OX3 this shift. Pt disoriented to situation. VSS on RA. Pt reported pain this shift controlled well  with current plan. Pt turned and repositioned as tolerated. CM looking for placement in a long term care facility.   Problem: Adult Inpatient Plan of CareGoal: Plan of Care ReviewOutcome: Interventions implemented as appropriate

## 2022-07-06 NOTE — Plan of Care
Plan of Care Overview/ Patient Status    RN 7P-7A Pt A&OX3 this shift. Pt disoriented to situation. VSS on RA. Pt reported pain this shift controlled well  with current plan. Pt turned and repositioned as tolerated. CM looking for placement in a long term care facility.   Problem: Adult Inpatient Plan of CareGoal: Plan of Care ReviewOutcome: Interventions implemented as appropriate

## 2022-07-06 NOTE — Progress Notes
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 Interim Events: Yesterday:- no clinical change- awaiting LTC placementOvernight: NAEToday: reports slept well and is feeling good. No joint or abdominal pain. Was eating breakfast at time of examination. Illeostomy with stool passage on 7/16. Meds: Scheduled Meds:Current Facility-Administered Medications Medication Dose Route Frequency Provider Last Rate Last Admin ? diclofenac (VOLTAREN) 1 % gel 4 g  4 g Topical (Top) 4x Daily Larna Daughters, MD   4 g at 07/06/22 1323 ? enoxaparin (LOVENOX) injection 80 mg  80 mg Subcutaneous Q12H Abid, Bushra, MD   80 mg at 07/06/22 0917 ? famotidine (PEPCID) tablet 20 mg  20 mg Oral Daily Senaida Lange, MD   20 mg at 07/06/22 0917 ? ferrous gluconate (FERGON) tablet 324 mg  324 mg Oral Every Other Day Larna Daughters, MD   324 mg at 07/05/22 0900 ? gabapentin (NEURONTIN) capsule 100 mg  100 mg Oral TID Marlon Pel, MD   100 mg at 07/06/22 1323 ? lidocaine (LIDODERM) 5 % 1 patch  1 patch Transdermal Q24H Jerral Ralph, MD   1 patch at 07/04/22 1532 ? melatonin tablet 3 mg  3 mg Oral Nightly Pryor Ochoa, MD   3 mg at 07/05/22 2008 ? nystatin (MYCOSTATIN) powder 1 Application  1 Application  Topical (Top) BID Henriette Combs, MD   1 Application  at 07/06/22 [phone] ? predniSONE (DELTASONE) tablet 5 mg  5 mg Oral Daily Jerral Ralph, MD   5 mg at 07/06/22 9604 ? rosuvastatin (CRESTOR) tablet 10 mg  10 mg Per G Tube Nightly Dorisann Frames, MD   10 mg at 07/05/22 2008 Continuous Infusions:PRN Meds:acetaminophen, dextrose (GLUCOSE) oral liquid 15 g **OR** fruit juice **OR** skim milk, dextrose (GLUCOSE) oral liquid 30 g **OR** fruit juice, dextrose injection, dextrose injection, traMADoLObjective: Vitals: Temp:  [97.8 ?F (36.6 ?C)-98 ?F (36.7 ?C)] 98 ?F (36.7 ?C)Pulse:  [59-68] 59Resp:  [16-19] 18BP: (99-102)/(60-65) 102/65SpO2:  [95 %-99 %] 99 %Device (Oxygen Therapy): room air Weight: 74.1 kg I/O's: Intake/Output Summary (Last 24 hours) at 07/06/2022 1400Last data filed at 07/06/2022 1000Gross per 24 hour Intake 237 ml Output 1100 ml Net -863 ml  Physical Exam:General:  in no acute distressHEENT: MMM, sclera anictericPulmonary: CTAB; normal respiratory effortCardiac: RRR, no M/R/GAbdomen: soft, non-tender, non-distended, no guarding or rebound; +BSNeuro: Alert and oriented x3; no gross deficitsExtremities: no LEE, contracted left foot/ankle with intact passive ROM but weakness in extension and eversion Labs: MetabolicRecent Labs Lab 07/12/230557 NA 142 K 4.1 CL 111* CO2 22 BUN 14 CREATININE 0.38* ANIONGAP 9 CALCIUM 10.5*  HematologicRecent Labs Lab 07/12/230557 WBC 5.1 HGB 11.7 HCT 37.60 PLT 265 NEUTROPHILS 41.1  HepaticNo results for input(s): BILITOT, BILIDIR, ALT, AST, ALKPHOS, PROT, ALBUMIN, PTT, LABPROT, INR in the last 168 hours. 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 #DispositionPending T-19 approval. Her sister [name]) is her gaurdian, but she has  written letter to probate court to revise her guardianship. SW is informed about the situatuation. Had arrangements for a spend down of funds in order to meet eligibility requirements for her Title 81. 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 Concern she will likely remain bed bound given prolonged course.She explained of new join in the shoulders and therefore ESR was ordered to see if this is remission of RA. ESR was 45- cont PT x3 times/weekOld#HypercalcemiaStable Ca at 10.5 [Alb 2.4]. Previously seen by Endocrine. 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

## 2022-07-07 NOTE — Progress Notes
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 Interim Events: Yesterday:- no clinical change- awaiting LTC placementOvernight: NAEToday: no new concerns. Reports slept well and is feeling good. Meds: Scheduled Meds:Current Facility-Administered Medications Medication Dose Route Frequency Provider Last Rate Last Admin ? diclofenac (VOLTAREN) 1 % gel 4 g  4 g Topical (Top) 4x Daily Soleymanjahi, Saeed, MD   4 g at 07/06/22 2215 ? enoxaparin (LOVENOX) injection 80 mg  80 mg Subcutaneous Q12H Abid, Bushra, MD   80 mg at 07/06/22 2214 ? famotidine (PEPCID) tablet 20 mg  20 mg Oral Daily Senaida Lange, MD   20 mg at 07/06/22 0917 ? ferrous gluconate (FERGON) tablet 324 mg  324 mg Oral Every Other Day Larna Daughters, MD   324 mg at 07/05/22 0900 ? gabapentin (NEURONTIN) capsule 100 mg  100 mg Oral TID Marlon Pel, MD   100 mg at 07/06/22 2214 ? lidocaine (LIDODERM) 5 % 1 patch  1 patch Transdermal Q24H Jerral Ralph, MD   1 patch at 07/06/22 1703 ? melatonin tablet 3 mg  3 mg Oral Nightly Pryor Ochoa, MD   3 mg at 07/06/22 2214 ? nystatin (MYCOSTATIN) powder 1 Application  1 Application  Topical (Top) BID Henriette Combs, MD   1 Application  at 07/06/22 [phone] ? predniSONE (DELTASONE) tablet 5 mg  5 mg Oral Daily Jerral Ralph, MD   5 mg at 07/06/22 9604 ? rosuvastatin (CRESTOR) tablet 10 mg  10 mg Per G Tube Nightly Dorisann Frames, MD   10 mg at 07/06/22 2214 Continuous Infusions:PRN Meds:acetaminophen, dextrose (GLUCOSE) oral liquid 15 g **OR** fruit juice **OR** skim milk, dextrose (GLUCOSE) oral liquid 30 g **OR** fruit juice, dextrose injection, dextrose injection, traMADoLObjective: Vitals: Temp:  [98 ?F (36.7 ?C)] 98 ?F (36.7 ?C)Pulse:  [59-63] 63Resp:  [16-18] 16BP: (102-112)/(65-75) 112/75SpO2:  [96 %-99 %] 96 %Device (Oxygen Therapy): room air Weight: 74.1 kg I/O's: Intake/Output Summary (Last 24 hours) at 07/07/2022 0658Last data filed at 07/07/2022 0600Gross per 24 hour Intake 237 ml Output 1350 ml Net -1113 ml  Physical Exam:General:  in no acute distressHEENT: MMM, sclera anictericPulmonary: CTAB; normal respiratory effortCardiac: RRR, no M/R/GAbdomen: soft, non-tender, non-distended, no guarding or rebound; +BSNeuro: Alert and oriented x3; no gross deficitsExtremities: no LEE, contracted left foot/ankle with intact passive ROM but weakness in extension and eversion Labs: MetabolicRecent Labs Lab 07/12/230557 NA 142 K 4.1 CL 111* CO2 22 BUN 14 CREATININE 0.38* ANIONGAP 9 CALCIUM 10.5*  HematologicRecent Labs Lab 07/12/230557 WBC 5.1 HGB 11.7 HCT 37.60 PLT 265 NEUTROPHILS 41.1  HepaticNo results for input(s): BILITOT, BILIDIR, ALT, AST, ALKPHOS, PROT, ALBUMIN, PTT, LABPROT, INR in the last 168 hours. 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 #DispositionPending T-19 approval. Her sister [name]) is her gaurdian, but she has written letter to probate court to revise her guardianship. SW is informed about the situatuation. Had arrangements for  a spend down of funds in order to meet eligibility requirements for her Title 76. 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 Concern she will likely remain bed bound given prolonged course.She explained of new join in the shoulders and therefore ESR was ordered to see if this is remission of RA. ESR was 45- cont PT x3 times/weekOld#HypercalcemiaStable Ca at 10.5 [Alb 2.4]. Previously seen by Endocrine. 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

## 2022-07-07 NOTE — Plan of Care
Plan of Care Overview/ Patient Status    1900 - 0700 A&Ox3-4, disoriented to elements of situation. Pt expressing sadness about prolonged admission and limited mobility/working w/PT. Emotional support provided. VSS on RA. Reported severe pain to LLE, scheduled and PRN analgesics continued. Appeared to fall asleep shortly after 25mg  Tramadol administration. Ileostomy w/mushy stool. T&P'd as tolerated, pt often refuses, education provided. Appeared to rest comfortably overnight. Awaiting LTP. Hourly rounding performed and safety maintained. Please continue to follow POC, see flow sheets for complete assessment. Problem: Adult Inpatient Plan of CareGoal: Plan of Care ReviewOutcome: Interventions implemented as appropriateFlowsheets (Taken 07/07/2022 0253)Progress: no changePlan of Care Reviewed With: patient

## 2022-07-08 NOTE — Progress Notes
[facility]	 [facility]	Medicine Progress NoteAttending Provider: Haskell Riling, MDDate of Admission: 2/21/2023Patient Summary: 66 yo conserved F with PMHx RA, multiple abdominal surgeries, chronic hypercalcemia with prolonged hospital stay at OSH and subsequent transfer to [facility] on 68/21/23 for encephalopathy and FTT, now resolved, but remains inpatient pending LTC, awaiting T19 process. Interim Events: Yesterday:- no clinical change- awaiting LTC placementOvernight: NAEToday: no new concernsMeds: Scheduled Meds:Current Facility-Administered Medications Medication Dose Route Frequency Provider Last Rate Last Admin ? diclofenac (VOLTAREN) 1 % gel 4 g  4 g Topical (Top) 4x Daily Soleymanjahi, Saeed, MD   4 g at 07/07/22 1313 ? enoxaparin (LOVENOX) injection 80 mg  80 mg Subcutaneous Q12H Abid, Bushra, MD   80 mg at 07/07/22 2114 ? famotidine (PEPCID) tablet 20 mg  20 mg Oral Daily Senaida Lange, MD   20 mg at 07/07/22 0981 ? ferrous gluconate (FERGON) tablet 324 mg  324 mg Oral Every Other Day Larna Daughters, MD   324 mg at 07/07/22 1914 ? gabapentin (NEURONTIN) capsule 100 mg  100 mg Oral TID Marlon Pel, MD   100 mg at 07/07/22 2115 ? lidocaine (LIDODERM) 5 % 1 patch  1 patch Transdermal Q24H Jerral Ralph, MD   1 patch at 07/07/22 1724 ? melatonin tablet 3 mg  3 mg Oral Nightly Pryor Ochoa, MD   3 mg at 07/07/22 2115 ? nystatin (MYCOSTATIN) powder 1 Application  1 Application  Topical (Top) BID Henriette Combs, MD   1 Application  at 07/07/22 2119 ? predniSONE (DELTASONE) tablet 5 mg  5 mg Oral Daily Jerral Ralph, MD   5 mg at 07/07/22 7829 ? rosuvastatin (CRESTOR) tablet 10 mg  10 mg Per G Tube Nightly Dorisann Frames, MD   10 mg at 07/07/22 2115 Continuous Infusions:PRN Meds:acetaminophen, dextrose (GLUCOSE) oral liquid 15 g **OR** fruit juice **OR** skim milk, dextrose (GLUCOSE) oral liquid 30 g **OR** fruit juice, dextrose injection, dextrose injection, traMADoLObjective: Vitals: Temp:  [97.4 ?F (36.3 ?C)-98.9 ?F (37.2 ?C)] 97.4 ?F (36.3 ?C)Pulse:  [56-59] 56Resp:  [14-18] 14BP: (104-109)/(68-71) 104/68SpO2:  [98 %] 98 %Device (Oxygen Therapy): room air Weight: 74.1 kg I/O's: Intake/Output Summary (Last 24 hours) at 07/08/2022 0619Last data filed at 07/07/2022 2120Gross per 24 hour Intake -- Output 100 ml Net -100 ml  Physical Exam:General:  in no acute distressHEENT: MMM, sclera anictericPulmonary: CTAB; normal respiratory effortCardiac: RRR, no M/R/GAbdomen: soft, non-tender, non-distended, no guarding or rebound; +BSNeuro: Alert and oriented x3; no gross deficitsExtremities: no LEE, contracted left foot/ankle with intact passive ROM but weakness in extension and eversion Labs: MetabolicRecent Labs Lab 07/12/230557 NA 142 K 4.1 CL 111* CO2 22 BUN 14 CREATININE 0.38* ANIONGAP 9 CALCIUM 10.5*  HematologicRecent Labs Lab 07/12/230557 WBC 5.1 HGB 11.7 HCT 37.60 PLT 265 NEUTROPHILS 41.1  HepaticNo results for input(s): BILITOT, BILIDIR, ALT, AST, ALKPHOS, PROT, ALBUMIN, PTT, LABPROT, INR in the last 168 hours. SpecialNo results for input(s): URICACID, LDH, FIBRINOGEN, DDIMER, FERRITIN, CRP in the last 168 hours.Invalid input(s): ESR Micro:No newDiagnostics:No newECG/Tele Events: No newAssessment & Plan: 66 yo conserved F with PMHx RA, multiple abdominal surgeries, chronic hypercalcemia with prolonged hospital stay at OSH and subsequent transfer to [facility] on 68/21/23 for encephalopathy and FTT, now resolved, but remains inpatient pending LTC, awaiting T19 process. #DispositionPending T-19 approval. Her sister [name]) is her guardian, but she has written letter to probate court to revise her guardianship. SW is informed about the situatuation. Had arrangements for a spend down of funds in order to  meet eligibility requirements for her Title 47. 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 Concern she will likely remain bed bound given prolonged course.She explained of new join in the shoulders and therefore ESR was ordered to see if this is remission of RA. ESR was 45- cont PT x3 times/weekOld#HypercalcemiaStable Ca at 10.5 [Alb 2.4]. Previously seen by Endocrine. 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

## 2022-07-08 NOTE — Plan of Care
Plan of Care Overview/ Patient Status    0730-1900: No changes to POC. See flowsheets for assessment. Report given to on coming RNProblem: Adult Inpatient Plan of CareGoal: Plan of Care ReviewOutcome: Interventions implemented as appropriateFlowsheets (Taken 07/07/2022 1135)Progress: no changePlan of Care Reviewed With: patient Problem: Adult Inpatient Plan of CareGoal: Patient-Specific Goal (Individualized)Outcome: Interventions implemented as appropriateFlowsheets (Taken 07/07/2022 1112)What Anxieties, Fears, Concerns or Questions Do You Have About Your Care?: none statedIndividualized Preferences and Care Needs: updates on pocPatient Centered Long Term Goal: safe d/cPatient Centered Daily Goal: safety

## 2022-07-08 NOTE — Plan of Care
Plan of Care Overview/ Patient Status    Medical Social Work Follow Up  [employer] Most Recent Value Admission Information  Document Type Progress Note Reason for Inability to Assess --  [Patient mental status is altered] (For Inpatient/ED Only) Prior psychosocial assessment has been documented within this hospitalization No (For Inpatient/ED Only) Prior psychosocial assessment has been documented within 30 days of this hospitalization No Reason for Current Social Work Involvement Surveyor, quantity of Information Patient, Geophysicist/field seismologist, Cabin crew Medical Team Comment Transition Coordination Community Agency Comment [place] [address] [facility] Record Reviewed Yes Level of Care Inpatient Psychosocial issues requiring intervention Conservator, Support Psychosocial interventions 15 minutes spent face to face with Danielle [name] and then on the phone with [employer]. I reviewed the chart. Danielle [name] informed that she continues to follow-up with the court and had provided paperwork from [facility] to [employer]. I then met with Danielle [name] who presented as pleasant and easily engaged. She informed that she plans to write a letter to the court to have Danielle [name] removed from being her conservator as she feels it would be best to have an independent conservator if she needs to have one at all. I provided support and validated her feelings. I discussed case with Optometrist. Collaborations [employer], Transition Coordination Specific referrals to enhance community supports (include existing and new resources) None Handoff Required? No Next Steps/Plan (including hand-off): I will continue to remain involved for support as needed. Signature: Leim Fabry, LCSW Contact Information: [phone]

## 2022-07-08 NOTE — Plan of Care
Plan of Care Overview/ Patient Status    1900-0700: Pt A&Ox4.Forget to situation at times. VSS on RA.Denies pain. No c/o SOB, CP, N/V/D. Right side ileostomy +flatus and stool.  Pt incontinent of urine, Pure wick in place and changed this shift. Old peg site, dressing clean dry and intact. Good PO intake. BLE elevated with pillows. Pt resting in bed. Hourly rounding. T&Rq2h. Safety precautions maintained, bed alarm on, call bell in reach, will ctm and follow nursing poc.

## 2022-07-08 NOTE — Plan of Care
Plan of Care Overview/ Patient Status    0700-1900Patient is A&Ox3-4 forgetful at times. VSS on RA. C/O left hip pain and left ankle pain. PRN tramadol and tylenol administered. Takes pills whole. Illeostomy  w/ good output. AX2 turn and reposition q2hr. Blanchable redness to L heel, heels elevated. Specialty bed in place, Safety checks and hourly rounding performed, see flow sheet and MAR for detailsProblem: Adult Inpatient Plan of CareGoal: Plan of Care ReviewOutcome: Interventions implemented as appropriateGoal: Patient-Specific Goal (Individualized)Outcome: Interventions implemented as appropriateGoal: Absence of Hospital-Acquired Illness or InjuryOutcome: Interventions implemented as appropriateGoal: Optimal Comfort and WellbeingOutcome: Interventions implemented as appropriateGoal: Readiness for Transition of CareOutcome: Interventions implemented as appropriate

## 2022-07-09 LAB — CBC WITH AUTO DIFFERENTIAL
BKR WAM ABSOLUTE IMMATURE GRANULOCYTES.: 0.01 x 1000/ÂµL (ref 0.00–0.30)
BKR WAM ABSOLUTE LYMPHOCYTE COUNT.: 2.14 x 1000/ÂµL (ref 0.60–3.70)
BKR WAM ABSOLUTE NRBC (2 DEC): 0 x 1000/ÂµL (ref 0.00–1.00)
BKR WAM ANALYZER ANC: 2.3 x 1000/ÂµL (ref 2.00–7.60)
BKR WAM BASOPHIL ABSOLUTE COUNT.: 0.03 x 1000/ÂµL (ref 0.00–1.00)
BKR WAM BASOPHILS: 0.6 % (ref 0.0–1.4)
BKR WAM EOSINOPHIL ABSOLUTE COUNT.: 0.37 x 1000/ÂµL (ref 0.00–1.00)
BKR WAM EOSINOPHILS: 6.8 % — ABNORMAL HIGH (ref 0.0–5.0)
BKR WAM HEMATOCRIT (2 DEC): 37.1 % (ref 35.00–45.00)
BKR WAM HEMOGLOBIN: 11.4 g/dL — ABNORMAL LOW (ref 11.7–15.5)
BKR WAM IMMATURE GRANULOCYTES: 0.2 % (ref 0.0–1.0)
BKR WAM LYMPHOCYTES: 39.3 % (ref 17.0–50.0)
BKR WAM MCH (PG): 27.9 pg (ref 27.0–33.0)
BKR WAM MCHC: 30.7 g/dL — ABNORMAL LOW (ref 31.0–36.0)
BKR WAM MCV: 90.7 fL (ref 80.0–100.0)
BKR WAM MONOCYTE ABSOLUTE COUNT.: 0.6 x 1000/ÂµL (ref 0.00–1.00)
BKR WAM MONOCYTES: 11 % (ref 4.0–12.0)
BKR WAM MPV: 10.5 fL (ref 8.0–12.0)
BKR WAM NEUTROPHILS: 42.1 % (ref 39.0–72.0)
BKR WAM NUCLEATED RED BLOOD CELLS: 0 % (ref 0.0–1.0)
BKR WAM PLATELETS: 286 x1000/ÂµL (ref 150–420)
BKR WAM RDW-CV: 17.7 % — ABNORMAL HIGH (ref 11.0–15.0)
BKR WAM RED BLOOD CELL COUNT.: 4.09 M/ÂµL (ref 4.00–6.00)
BKR WAM WHITE BLOOD CELL COUNT: 5.5 x1000/ÂµL (ref 4.0–11.0)

## 2022-07-09 LAB — COMPREHENSIVE METABOLIC PANEL
BKR A/G RATIO: 1.1 (ref 1.0–2.2)
BKR ALANINE AMINOTRANSFERASE (ALT): 16 U/L (ref 10–35)
BKR ALBUMIN: 3.6 g/dL (ref 3.6–4.9)
BKR ALKALINE PHOSPHATASE: 58 U/L (ref 9–122)
BKR ANION GAP: 10 (ref 7–17)
BKR ASPARTATE AMINOTRANSFERASE (AST): 16 U/L (ref 10–35)
BKR AST/ALT RATIO: 1
BKR BILIRUBIN TOTAL: <0.2 mg/dL (ref <=1.2)
BKR BLOOD UREA NITROGEN: 11 mg/dL (ref 8–23)
BKR BUN / CREAT RATIO: 32.4 — ABNORMAL HIGH (ref 8.0–23.0)
BKR CALCIUM: 10.4 mg/dL — ABNORMAL HIGH (ref 8.8–10.2)
BKR CHLORIDE: 107 mmol/L (ref 98–107)
BKR CO2: 23 mmol/L (ref 20–30)
BKR CREATININE: 0.34 mg/dL — ABNORMAL LOW (ref 0.40–1.30)
BKR EGFR, CREATININE (CKD-EPI 2021): >60 mL/min/{1.73_m2} (ref >=60)
BKR GLOBULIN: 3.4 g/dL (ref 2.3–3.5)
BKR GLUCOSE: 88 mg/dL (ref 70–100)
BKR POTASSIUM: 4.3 mmol/L (ref 3.3–5.3)
BKR PROTEIN TOTAL: 7 g/dL (ref 6.6–8.7)
BKR SODIUM: 140 mmol/L (ref 136–144)

## 2022-07-09 MED ORDER — LIDOCAINE 4 % TOPICAL PATCH
4 % | TRANSDERMAL | Status: DC
Start: 2022-07-09 — End: 2022-08-15

## 2022-07-09 NOTE — Progress Notes
[facility]	 [facility]	Medicine Progress NoteAttending Provider: Haskell [name], MDDate of Admission: 2/21/2023Patient Summary: 66 yo conserved F with PMHx RA, multiple abdominal surgeries, chronic hypercalcemia with prolonged hospital stay at OSH and subsequent transfer to [facility] on 02/11/22 for encephalopathy and FTT, now resolved, but remains inpatient pending LTC, awaiting T19 process. Interim Events: Yesterday:- no clinical change- awaiting LTC placementOvernight: NAEToday: no new concernsMeds: Scheduled Meds:Current Facility-Administered Medications Medication Dose Route Frequency Provider Last Rate Last Admin ? diclofenac (VOLTAREN) 1 % gel 4 g  4 g Topical (Top) 4x Daily Danielle Daughters, MD   4 g at 07/08/22 2048 ? enoxaparin (LOVENOX) injection 80 mg  80 mg Subcutaneous Q12H Danielle [name], Bushra, MD   80 mg at 07/08/22 2045 ? famotidine (PEPCID) tablet 20 mg  20 mg Oral Daily Danielle Lange, MD   20 mg at 07/08/22 1610 ? ferrous gluconate (FERGON) tablet 324 mg  324 mg Oral Every Other Day Danielle Daughters, MD   324 mg at 07/07/22 9604 ? gabapentin (NEURONTIN) capsule 100 mg  100 mg Oral TID Danielle Pel, MD   100 mg at 07/08/22 2045 ? lidocaine (LIDODERM) 5 % 1 patch  1 patch Transdermal Q24H Danielle Ralph, MD   1 patch at 07/08/22 1434 ? melatonin tablet 3 mg  3 mg Oral Nightly Danielle Ochoa, MD   3 mg at 07/08/22 2045 ? nystatin (MYCOSTATIN) powder 1 Application  1 Application  Topical (Top) BID Danielle Combs, MD   1 Application  at 07/08/22 2046 ? predniSONE (DELTASONE) tablet 5 mg  5 mg Oral Daily Danielle Ralph, MD   5 mg at 07/08/22 5409 ? rosuvastatin (CRESTOR) tablet 10 mg  10 mg Per G Tube Nightly Danielle Frames, MD   10 mg at 07/08/22 2045 Continuous Infusions:PRN Meds:acetaminophen, dextrose (GLUCOSE) oral liquid 15 g **OR** fruit juice **OR** skim milk, dextrose (GLUCOSE) oral liquid 30 g **OR** fruit juice, dextrose injection, dextrose injection, traMADoLObjective: Vitals: Temp:  [97.6 ?F (36.4 ?C)-98.3 ?F (36.8 ?C)] 98.3 ?F (36.8 ?C)Pulse:  [61-68] 64Resp:  [18-20] 19BP: (100-111)/(62-67) 100/63SpO2:  [95 %-99 %] 95 %Device (Oxygen Therapy): room air Weight: 72.7 kg I/O's: Intake/Output Summary (Last 24 hours) at 07/09/2022 0640Last data filed at 07/08/2022 1906Gross per 24 hour Intake -- Output 2100 ml Net -2100 ml  Physical Exam:General:  in no acute distressHEENT: MMM, sclera anictericPulmonary: CTAB; normal respiratory effortCardiac: RRR, no M/R/GAbdomen: soft, non-tender, non-distended, no guarding or rebound; +BSNeuro: Alert and oriented x3; no gross deficitsExtremities: no LEE, contracted left foot/ankle with intact passive ROM but weakness in extension and eversion Labs: MetabolicRecent Labs Lab 07/19/230500 NA 140 K 4.3 CL 107 CO2 23 BUN 11 CREATININE 0.34* ANIONGAP 10 CALCIUM 10.4*  HematologicRecent Labs Lab 07/19/230500 WBC 5.5 HGB 11.4* HCT 37.10 PLT 286 NEUTROPHILS 42.1  HepaticRecent Labs Lab 07/19/230500 BILITOT <0.2 ALT 16 AST 16 ALKPHOS 58 PROT 7.0 ALBUMIN 3.6  SpecialNo results for input(s): URICACID, LDH, FIBRINOGEN, DDIMER, FERRITIN, CRP in the last 168 hours.Invalid input(s): ESR Micro:No newDiagnostics:No newECG/Tele Events: No newAssessment & Plan: 66 yo conserved F with PMHx RA, multiple abdominal surgeries, chronic hypercalcemia with prolonged hospital stay at OSH and subsequent transfer to [facility] on 02/11/22 for encephalopathy and FTT, now resolved, but remains inpatient pending LTC, awaiting T19 process. #DispositionPending T-19 approval. Her sister Danielle [name]) is her guardian, but she has written letter to probate court to revise her guardianship. SW is informed about the situation. Had arrangements for a spend down of funds in order to meet  eligibility requirements for her Title 26. 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 Concern she will likely remain bed bound given prolonged course.She had new joint pain in the shoulders and therefore ESR was ordered to see if this is RA. ESR was 45- cont PT x3 times/weekOld#HypercalcemiaStable Ca at 10.5 (Alb 3.6). Previously seen by Endocrine. Suspect 2/2 to prolonged immobolity vs primary hyperparathyroidism - cont PT x3 times/week- will need to f/u with Endocrine regarding need for bisphosphonate therapy (outpatient)- she is s/p reclast x1 with Endocrine on 3/23- will likely need DEXA scan as an outpatientChronic# non-occlusive DVT despite being on Eliquis for previous DVT- Continue lovenox 80mg  BID?# Iron Deficiency- Continue ferrous gluconate 324 mg PO QOD ?# Chronic polyneuropathy- Continue gabapentin 100 mg TIDDIET: regularPPX: LovenoxCODE STATUS: Full CodeDISPO:  LTC (pending T-19)Signed:Leshon [name], MDYale Internal MedicineReachable by MHBJuly 19, 2023

## 2022-07-09 NOTE — Plan of Care
Plan of Care Overview/ Patient Status    1900-0700: A/o x4, forgetful, VSS on room air. C/o b/l foot pain, prn tramadol, tyelnol, & gabapentin  given with positive effect. Denies SOB/chest pain. ostomy in place, CDI,  purewick in place, incontinence care provided as needed. heels offloaded, please see flowsheet for full skin assessment. Pills whole. Assist x 2 in bed. T & R Q2 hours. Hourly rounding completed, safety maintained. Call bell within reach

## 2022-07-09 NOTE — Plan of Care
Problem: Physical Therapy GoalsGoal: Physical Therapy GoalsDescription: PT GOALS - addressed 05/02/22.1. Patient will demonstrate active range of motion x 4 extremities in preparation for mobility (GOAL MET)2. Caregivers will be independent and safely implement a range of motion/positioning program to prevent loss of range of motion and skin integrity (ONGOING)3. Patient will perform bed mobility with maximal assist (GOAL MET)4. Patient will tolerate sitting edge of bed >5 minutes with minimal assist (GOAL MET)5. Patient will perform transfers with moderate assist of 2 using rolling walker (ONGOING)6. Pt will perform bed mobility w/ modAx1 (ONGOING)7. Pt will perform STS transfer w/ modAx2 for a standing tolerance of 1 minute. (NEW)Goals 7/19/2023Prevent further contractures and deformityPt to follow daily exercise programPatient to sit EOB for 15 minutes with FAIR balancePatient to stand with mod to max assist of 2 with RW in preparation for transfer Inpatient Physical Therapy Re-Evaluation IP Adult PT Eval/Treat - 07/09/22 1130    Date of Visit / Treatment  Date of Visit / Treatment 07/09/22   Note Type Re-Evaluation   Progress Report Due 07/23/22   End Time 1130    General Information  Pertinent History Of Current Problem Pt is a 66 yr old female who has been hospitalized at [facility] now for 148 days.  Patient is now conserve.  PMH includes RA, multiple abdominal surgeries, chronic hypercalcemia, prolonged hosptial stay at OSH,  transferred to [facility] on 02/11/22 for encephalopathy and FTT.  Pt awaiting the T-19 process for long term placement.   Subjective  I never know when therapy is going to come to visit me.   General Observations Pt supine in bed, head of bed elevated 45 degrees   Precautions/Limitations Fall Precautions;Bed alarm   Precautions/Limitations Comment aspiration precautions   Hand Dominance right    Weight Bearing Status  Weight Bearing Status WNL - Within normal limits    Vital Signs and Orthostatic Vital Signs  Vital Signs Vital Signs Stable   Vital Signs Free text Per chart, RA, NAD    Pain/Comfort  Location #1 - PreTreatment Rating (Numbers Scale) 0/10 - no pain   Posttreatment Rating (Numbers Scale) 0/10 - no pain   Pain Comment (Pre/Post Treatment Pain) pt does have pain in L lower leg/ankle when moving     7/10   Pain Rating at Rest 0/10 - no pain   Pain Rating with Activity 7/10    Patient Coping  Observed Emotional State accepting   Verbalized Emotional State acceptance   Diversional Activities television    Cognition  Overall Cognitive Status Impaired   Level of Consciousness alert   Following Commands Follows one step commands with increased time;Follows one step commands with repetition   Personal Safety / Judgment Fall risk;Requires supervision with mobility    Range of Motion  Range of Motion Comments LUE ROM is WFL,  RUE lmiited at 100 degrees of shoulder flexion,  both LE's have at least 20 degrees of knee flexion contracturesm  L ankle beginning to have decreased ROM    Manual Muscle Testing  Manual Muscle Testing Comments deconditioned    Musculoskeletal  LUE Muscle Strength Grading 3-->active movement against gravity   RUE Muscle Strength Grading 3-->active movement against gravity   LLE Muscle Strength Grading 2-->active movement with gravity eliminated   RLE Muscle Strength Grading 2-->active movement with gravity eliminated    Muscle Tone  Muscle Tone Testing Results No muscle tone deficits noted    Coordination  Opposition WFL - Within Functional Limits    Sensory  Assessment  Sensory Assessment Comments hypersensitivity both LE's    Skin Assessment  Skin Assessment See Nursing Documentation    Balance  Sitting Balance: Static  POOR+    Minimal assist to maintain static position with no Assistive Device   Sitting Balance: Dynamic  POOR     Moves through 1/4 to 1/2 ROM range with moderate assist to right self   Standing Balance: Static POOR-     Maximal assist to maintain static position with no Assistive Device   Standing Balance: Dynamic  POOR-    Unable to move from midline voluntarily, maximal assist to right self   Balance Assist Device Hand held assist   Balance Skills Training Comment no standing performed at this time, pt requries max assist of 2 to attempt any standing    Bed Mobility  Rolling/Turning Right - Independence/Assistance Level Moderate assist   Rolling/Turning Left - Independence/Assistance Level Moderate assist   Rolling/Turning Assist Device Bed rails   Supine-to-Sit Independence/Assistance Level Maximum assist   Supine-to-Sit Assist Device Hand held assist   Sit-to-Supine Independence/Assistance Level Maximum assist   Sit-to-Supine Assist Device Hand held assist   Limitations decreased ability to use legs for bridging/pushing   Bed Mobility, Impairments ROM decreased;strength decreased   Bed mobility was limited by: impaired motor function    Sit-Stand Transfer Training  Sit-to-Stand Transfer Independence/Assistance Level Unable to perform;Total assist/dependent    Gait Training  Independence/Assistance Level  Unable to perform   Gait Training Comments unable to perform    Handoff Documentation  Handoff Patient in bed;Bed alarm;Patient instructed to call nursing for mobility;Discussed with nursing   Handoff Comments pt postioned in sidelying position for comfort, knees extended and L ankle positioned against foam wedge for neutral posiion    Endurance  Endurance Comments poor+    PT- AM-PAC - Basic Mobility Screen- How much help from another person do you currently need.....  Turning from your back to your side while in a a flat bed without using rails? 1 - Total - Requires total assistance or cannot do it at all. Moving from lying on your back to sitting on the side of a flat bed without using bed rails? 1 - Total - Requires total assistance or cannot do it at all.   Moving to and from a bed to a chair (including a wheelchair)? 1 - Total - Requires total assistance or cannot do it at all.   Standing up from a chair using your arms(e.g., wheelchair or bedside chair)? 1 - Total - Requires total assistance or cannot do it at all.   To walk in a hospital room? 1 - Total - Requires total assistance or cannot do it at all.   Climbing 3-5 steps with a railing? 1 - Total - Requires total assistance or cannot do it at all.   AMPAC Mobility Score 6   TARGET Highest Level of Mobility Mobility Level 2, Turn self in bed/bed activity/dependent transfer    ACTUAL Highest Level of Mobility Mobility Level 3, Sit on edge of bed    Therapeutic Exercise  Therapeutic Exercise Comments Resistive ther ex for UEs,  pt also using theraband for UE exercises,  passive and assistive ROM ex for LE's, active and active assisted ex while seated    Therapeutic Functional Activity  Therapeutic Functional Activity Comments bed mob, sitting, re-positioning    Clinical Impression  Follow up Assessment 07/09/2022   Pi is a 66 yr old female who has been hospitalized for  148 days.   She is alert, cooperative.  ROM is limited at end range of R shoulder flexion otherwise ROM of UE's is WFL,  both LEs are limited in knee extension ( -20 degrees due to flexion contractures )  L ankle also limited,  Strength in UE's generally FAIR,  LE's generally POOR, pt requires mod assist for turning in bed and sitting up at edge of bed, sitting balance is FAIR once pt assumes seated position,  Standing requires max assistance and is totally dependent at this time.  Recommending pt use mechanical lift for OOB transfers,   Patient will benefit from continued skilled PT services to address her areas of impairment   Criteria for Skilled Therapeutic Interventions Met yes   Rehab Potential fair, will monitor progress closely    Patient/Family Stated Goals  Patient/Family Stated Goal(s) feel better    Frequency/Equipment Recommendations  PT Frequency 2x per week   What day of week is next treatment expected? Friday   7/21  PT/PTA completing this assessment [facility]   Equipment Needs During Admission/Treatment Hospital bed;Hoyer lift    PT Recommendations for Inpatient Admission  Activity/Level of Assist dangle edge of bed   ADL Recommendations assist of 2   Therapeutic Exercise ROM as tolerated    Planned Treatment / Interventions  Plan for Next Visit ther ex, sitting, balance activities, attempt standing   Training Treatment / Interventions Functional Mobility Training;Strength Training   Education Treatment / Interventions Patient Education / Training    PT Discharge Summary  Physical Therapy Disposition Recommendation SNF for long term placement     Outcome: Interventions implemented as appropriate Plan of Care Overview/ Patient Status

## 2022-07-10 NOTE — Plan of Care
Plan of Care Overview/ Patient Status    0700-1900Pt is aox3, vss on room air. Pleasant, cooperative. Ostomy emptied several times. Inc urine, purewick in place draining clear yellow urine.Pt repositioned with assistance, heels elevated off pillows, Voltaren cream applied to bilat heels.Pt tolerating regular diet, takes pills whole.Bed locked in lowest position, bed alarm on, call bell within reach, pt calls appropriately for assistance.

## 2022-07-10 NOTE — Progress Notes
[facility]	 [facility]	Medicine Progress NoteAttending Provider: Haskell Riling, MDDate of Admission: 2/21/2023Patient Summary: 66 yo conserved F with PMHx RA, multiple abdominal surgeries, chronic hypercalcemia with prolonged hospital stay at OSH and subsequent transfer to [facility] on 02/11/22 for encephalopathy and FTT, now resolved, but remains inpatient pending LTC, awaiting T19 process. Interim Events: Yesterday:- no clinical change- awaiting LTC placement- PT worked with patientOvernight: NAEToday: no new concerns, ordering breakfastMeds: Scheduled Meds:Current Facility-Administered Medications Medication Dose Route Frequency Provider Last Rate Last Admin ? diclofenac (VOLTAREN) 1 % gel 4 g  4 g Topical (Top) 4x Daily Larna Daughters, MD   4 g at 07/09/22 2038 ? enoxaparin (LOVENOX) injection 80 mg  80 mg Subcutaneous Q12H Abid, Bushra, MD   80 mg at 07/09/22 2036 ? famotidine (PEPCID) tablet 20 mg  20 mg Oral Daily Senaida Lange, MD   20 mg at 07/09/22 0919 ? ferrous gluconate (FERGON) tablet 324 mg  324 mg Oral Every Other Day Larna Daughters, MD   324 mg at 07/09/22 0919 ? gabapentin (NEURONTIN) capsule 100 mg  100 mg Oral TID Marlon Pel, MD   100 mg at 07/09/22 2037 ? lidocaine 4 % topical patch 1 patch  1 patch Transdermal Q24H Babette Relic, MD   1 patch at 07/09/22 1530 ? melatonin tablet 3 mg  3 mg Oral Nightly Pryor Ochoa, MD   3 mg at 07/09/22 2037 ? nystatin (MYCOSTATIN) powder 1 Application  1 Application  Topical (Top) BID Henriette Combs, MD   1 Application  at 07/08/22 2046 ? predniSONE (DELTASONE) tablet 5 mg  5 mg Oral Daily Jerral Ralph, MD   5 mg at 07/09/22 0919 ? rosuvastatin (CRESTOR) tablet 10 mg  10 mg Per G Tube Nightly Dorisann Frames, MD   10 mg at 07/09/22 2037 Continuous Infusions:PRN Meds:acetaminophen, dextrose (GLUCOSE) oral liquid 15 g **OR** fruit juice **OR** skim milk, dextrose (GLUCOSE) oral liquid 30 g **OR** fruit juice, dextrose injection, dextrose injection, traMADoLObjective: Vitals: Temp:  [97.5 ?F (36.4 ?C)-98 ?F (36.7 ?C)] 98 ?F (36.7 ?C)Pulse:  [59-86] 66Resp:  [16-20] 16BP: (99-157)/(64-81) 99/65SpO2:  [93 %-97 %] 97 %Device (Oxygen Therapy): room air Weight: 71.6 kg I/O's: Intake/Output Summary (Last 24 hours) at 07/10/2022 0637Last data filed at 07/09/2022 2300Gross per 24 hour Intake -- Output 125 ml Net -125 ml  Physical Exam:General:  in no acute distressHEENT: MMM, sclera anictericPulmonary: CTAB; normal respiratory effortCardiac: RRR, no M/R/GAbdomen: soft, non-tender, non-distended, no guarding or rebound; +BSNeuro: Alert and oriented x3; no gross deficitsExtremities: no LEE, contracted left foot/ankle with intact passive ROM but weakness in extension and eversion Labs: MetabolicRecent Labs Lab 07/19/230500 NA 140 K 4.3 CL 107 CO2 23 BUN 11 CREATININE 0.34* ANIONGAP 10 CALCIUM 10.4*  HematologicRecent Labs Lab 07/19/230500 WBC 5.5 HGB 11.4* HCT 37.10 PLT 286 NEUTROPHILS 42.1  HepaticRecent Labs Lab 07/19/230500 BILITOT <0.2 ALT 16 AST 16 ALKPHOS 58 PROT 7.0 ALBUMIN 3.6  SpecialNo results for input(s): URICACID, LDH, FIBRINOGEN, DDIMER, FERRITIN, CRP in the last 168 hours.Invalid input(s): ESR Micro:No newDiagnostics:No newECG/Tele Events: No newAssessment & Plan: 66 yo conserved F with PMHx RA, multiple abdominal surgeries, chronic hypercalcemia with prolonged hospital stay at OSH and subsequent transfer to [facility] on 02/11/22 for encephalopathy and FTT, now resolved, but remains inpatient pending LTC, awaiting T19 process. #DispositionPending T-19 approval. Her sister [name]) is her guardian, but she has written letter to probate court to revise her guardianship. SW is informed about the situation. Had arrangements for a spend  down of funds in order to meet eligibility requirements for her Title 57. 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 Concern she will likely remain bed bound given prolonged course.She had new joint pain in the shoulders and therefore ESR was ordered to see if this is RA. ESR was 45- cont PT x3 times/week- cont voltaren gel and lidocaine patchOld#HypercalcemiaStable Ca at 10.5 (Alb 3.6). Previously seen by Endocrine. 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

## 2022-07-10 NOTE — Plan of Care
Plan of Care Overview/ Patient Status    1900 - 0700 A&Ox3-4, disoriented to elements of situation. Soft BPs, otherwise VSS on RA. Continues to report severe LLE pain, especially w/any activity, scheduled gabapentin and Voltaren continued. Ileostomy intact w/dark brown mushy stool. Purewick in place draining concentrated, clear yellow urine. Assisted & encouraged to T&P, pt often refuses, education provided. Appeared to rest comfortably overnight. Awaiting LTC. Hourly rounding performed and safety maintained. Please continue to follow POC. See flow sheets for complete assessment. 0500 Refused AM labs.0640 LLE pain, PRN 25mg  tramadol & Tylenol given. Problem: Adult Inpatient Plan of CareGoal: Plan of Care ReviewOutcome: Interventions implemented as appropriateFlowsheets (Taken 07/10/2022 0327)Progress: no changePlan of Care Reviewed With: patient

## 2022-07-11 NOTE — Plan of Care
Plan of Care Overview/ Patient Status    0730-1900: no changes to poc. See flowsheet for assessment. Turned & repositioned in bed q2 hours. Heels elevated off bed on pillows. Specialty bed in place.

## 2022-07-11 NOTE — Plan of Care
Plan of Care Overview/ Patient Status    0700-1900Patient is A&Ox3-4 forgetful at times. VSS on RA. C/O left hip pain and left ankle pain. PRN tramadol and tylenol administered. Takes pills whole. Illeostomy  w/ good output. AX2 turn and reposition q2hr. Blanchable redness to L heel, heels elevated. Specialty bed in place, Safety checks and hourly rounding performed, see flow sheet and MAR for detailsProblem: Adult Inpatient Plan of CareGoal: Plan of Care ReviewOutcome: Interventions implemented as appropriateGoal: Patient-Specific Goal (Individualized)Outcome: Interventions implemented as appropriateGoal: Absence of Hospital-Acquired Illness or InjuryOutcome: Interventions implemented as appropriateGoal: Optimal Comfort and WellbeingOutcome: Interventions implemented as appropriateGoal: Readiness for Transition of CareOutcome: Interventions implemented as appropriate Problem: InfectionGoal: Absence of Infection Signs and SymptomsOutcome: Interventions implemented as appropriate Problem: Physical Therapy GoalsGoal: Physical Therapy GoalsDescription: PT GOALS - addressed 05/02/22.1. Patient will demonstrate active range of motion x 4 extremities in preparation for mobility (GOAL MET)2. Caregivers will be independent and safely implement a range of motion/positioning program to prevent loss of range of motion and skin integrity (ONGOING)3. Patient will perform bed mobility with maximal assist (GOAL MET)4. Patient will tolerate sitting edge of bed >5 minutes with minimal assist (GOAL MET)5. Patient will perform transfers with moderate assist of 2 using rolling walker (ONGOING)6. Pt will perform bed mobility w/ modAx1 (ONGOING)7. Pt will perform STS transfer w/ modAx2 for a standing tolerance of 1 minute. (NEW) Outcome: Interventions implemented as appropriate Problem: Occupational Therapy GoalsGoal: Occupational Therapy GoalsDescription: OT goals1. Pt will move to EOB for seated ADL mod I2. Pt will follow 3/4 simple one step motor commands IND3. Pt will sequence through set up ADL task INDOT re-eval, Initial goals not met, continue to progress towards initial goalsOutcome: Interventions implemented as appropriate Problem: Speech Language Pathology GoalsGoal: Dysphagia Goals, SLPDescription: Current goal: Patient will tolerate regular solids and thin liquids without adverse impact on respiratory status and while maintaining adequate oral nutrition.Prior goal (met): The patient will tolerate least restrictive diet without any decline in respiratory status.Prior goal (met): Pt will tolerate pureed diet with thin liquids w/o s/x of aspiration or adverse impact on respiratory status.Outcome: Interventions implemented as appropriate Problem: Impaired Wound HealingGoal: Optimal Wound HealingOutcome: Interventions implemented as appropriate Problem: Fall Injury RiskGoal: Absence of Fall and Fall-Related InjuryOutcome: Interventions implemented as appropriate Problem: Skin Injury Risk IncreasedGoal: Skin Health and IntegrityOutcome: Interventions implemented as appropriate Problem: Mobility ImpairmentGoal: Optimal MobilityOutcome: Interventions implemented as appropriate Problem: Pain AcuteGoal: Acceptable Pain Control and Functional AbilityOutcome: Interventions implemented as appropriate Problem: Self-Care DeficitGoal: Improved Ability to Complete Activities of Daily LivingOutcome: Interventions implemented as appropriate Problem: Fluid, Electrolyte and Nutrition Imbalance (Ileostomy)Goal: Fluid, Electrolyte and Nutrition BalanceOutcome: Interventions implemented as appropriate Problem: Infection (Ileostomy)Goal: Absence of Infection Signs and SymptomsOutcome: Interventions implemented as appropriate Problem: Confusion ChronicGoal: Optimal Cognitive FunctionOutcome: Interventions implemented as appropriate

## 2022-07-11 NOTE — Plan of Care
Plan of Care Overview/ Patient Status    1900-0700A&Ox3, disoriented to situation. VSS on RA. C/o left leg pain, prn tramadol given with positive effect. On regular diet, pill whole. Ostomy bag in place, bag changed. Incontinent of bladder, purewick in place. Ax2 in bed, T&R q2h provided. See flowsheet for skin care. Bed alarm on, call bell within reach, frequent rounding done, safety maintained.

## 2022-07-11 NOTE — Plan of Care
Problem: Physical Therapy GoalsGoal: Physical Therapy GoalsDescription: PT GOALS - addressed 05/02/22.1. Patient will demonstrate active range of motion x 4 extremities in preparation for mobility (GOAL MET)2. Caregivers will be independent and safely implement a range of motion/positioning program to prevent loss of range of motion and skin integrity (ONGOING)3. Patient will perform bed mobility with maximal assist (GOAL MET)4. Patient will tolerate sitting edge of bed >5 minutes with minimal assist (GOAL MET)5. Patient will perform transfers with moderate assist of 2 using rolling walker (ONGOING)6. Pt will perform bed mobility w/ modAx1 (ONGOING)7. Pt will perform STS transfer w/ modAx2 for a standing tolerance of 1 minute. (NEW)Inpatient Physical Therapy Progress Note IP Adult PT Eval/Treat - 07/11/22 1609    Date of Visit / Treatment  Date of Visit / Treatment 07/11/22   Note Type Progress Note   Progress Report Due 07/23/22   End Time 1609    General Information  Pertinent History Of Current Problem per medical record   Subjective  Yes, I want to do exerises and sit up.   General Observations pt supine in bed, head of bed elevated 30 degrees   Precautions/Limitations Fall Precautions;Bed alarm   Precautions/Limitations Comment aspiration precautions   Hand Dominance right    Weight Bearing Status  Weight Bearing Status Comments WBAT on BLE    Vital Signs and Orthostatic Vital Signs  Vital Signs Vital Signs Stable    Pain/Comfort  Pain Comment (Pre/Post Treatment Pain) no complaints of pain while resting, but moving LLE does cause increased L lower leg pain   7/10   Pain Rating with Activity 7/10    Patient Coping  Observed Emotional State accepting   Verbalized Emotional State acceptance   Diversional Activities television    Cognition  Overall Cognitive Status Impaired   Orientation Level Oriented to person;Oriented to place;Oriented to time   Level of Consciousness alert   Following Commands Follows one step commands with increased time;Follows one step commands with repetition   Personal Safety / Judgment Fall risk;Requires supervision with mobility    Manual Muscle Testing  Manual Muscle Testing Comments deconditioned    Musculoskeletal  LUE Muscle Strength Grading 3-->active movement against gravity   RUE Muscle Strength Grading 3-->active movement against gravity   LLE Muscle Strength Grading 2-->active movement with gravity eliminated   RLE Muscle Strength Grading 2-->active movement with gravity eliminated    Muscle Tone  Muscle Tone Testing Results No muscle tone deficits noted    Skin Assessment  Skin Assessment See Nursing Documentation    Balance  Sitting Balance: Static  POOR+    Minimal assist to maintain static position with no Assistive Device   Sitting Balance: Dynamic  POOR     Moves through 1/4 to 1/2 ROM range with moderate assist to right self    Bed Mobility  Rolling/Turning Right - Independence/Assistance Level Moderate assist   Rolling/Turning Left - Independence/Assistance Level Moderate assist   Rolling/Turning Assist Device Bed rails   Supine-to-Sit Independence/Assistance Level Maximum assist   Supine-to-Sit Assist Device Hand held assist   Sit-to-Supine Independence/Assistance Level Maximum assist   Sit-to-Supine Assist Device Hand held assist   Limitations decreased ability to use arms for pushing/pulling;decreased ability to use legs for bridging/pushing   Bed Mobility, Impairments strength decreased;ROM decreased   Bed mobility was limited by: impaired motor function    Gait Training  Independence/Assistance Level  Unable to perform   Gait Training Comments unable to perform    Handoff Documentation  Handoff Patient in bed;Bed alarm   Handoff Comments pt positioned in upright position to drink her Ensure,  LE's rotated to R side with pillow between knees and feet against foam wedge    Endurance  Endurance Comments poor+    PT- AM-PAC - Basic Mobility Screen- How much help from another person do you currently need.....  Turning from your back to your side while in a a flat bed without using rails? 1 - Total - Requires total assistance or cannot do it at all.   Moving from lying on your back to sitting on the side of a flat bed without using bed rails? 1 - Total - Requires total assistance or cannot do it at all.   Moving to and from a bed to a chair (including a wheelchair)? 1 - Total - Requires total assistance or cannot do it at all.   Standing up from a chair using your arms(e.g., wheelchair or bedside chair)? 1 - Total - Requires total assistance or cannot do it at all.   To walk in a hospital room? 1 - Total - Requires total assistance or cannot do it at all.   Climbing 3-5 steps with a railing? 1 - Total - Requires total assistance or cannot do it at all.   AMPAC Mobility Score 6   TARGET Highest Level of Mobility Mobility Level 2, Turn self in bed/bed activity/dependent transfer    ACTUAL Highest Level of Mobility Mobility Level 3, Sit on edge of bed    Therapeutic Exercise  Therapeutic Exercise Comments ther ex program continues with patient in supine position,  pt transferred to sit EOB and performing active ther ex in seated position,   repositioned after RX    Therapeutic Functional Activity  Therapeutic Functional Activity Comments bed mob, sitting, ther ex    Clinical Impression  Criteria for Skilled Therapeutic Interventions Met yes   Rehab Potential fair, will monitor progress closely    Patient/Family Stated Goals  Patient/Family Stated Goal(s) feel better    Frequency/Equipment Recommendations  PT Frequency 2x per week   What day of week is next treatment expected? Monday   (7/24)  PT/PTA completing this assessment [facility] Equipment Needs During Admission/Treatment Hospital bed;Hoyer lift    PT Recommendations for Inpatient Admission  Activity/Level of Assist dangle edge of bed;assist of 2   ADL Recommendations assist of 2   Therapeutic Exercise ROM as tolerated    Planned Treatment / Interventions  Plan for Next Visit ther ex, sitting, balance work towards standing with max assist of 2   Training Treatment / Interventions Functional Mobility Training   Education Treatment / Interventions Patient Education / Training    PT Discharge Summary  Physical Therapy Disposition Recommendation SNF for long term placement      Outcome: Interventions implemented as appropriate Plan of Care Overview/ Patient Status

## 2022-07-11 NOTE — Plan of Care
Danielle [name]				Location: EP [id number] y.o., female				Attending: Haskell Riling, MD	Admit Date: 02/11/2022			[name] LOS: 150 days FOLLOW-UP NUTRITION ASSESSMENT Dietary Orders (From admission, onward)     Start     Ordered  06/27/22 1404  Nutrition Supplements  (Nutrition Supplement Panel)  Once      Comments: send TID with meals. Question Answer Comment Adult Supplements: Ensure Clear Berry [facility])  Supplement Frequency: TID    06/27/22 1403  06/20/22 1045  Diet Regular  DIET EFFECTIVE NOW      Question:  Initiate Nutrition Management Protocol (Yes/No?)  Answer:  Yes - Initiate Protocol  06/20/22 1045    No Known Allergies ANTHROPOMETRICS:Height: 70Admit wt: 79.4 kg (per SNF documentation)Current wt: 75.1 kg (bed, 7/21); BMI: 23.76 kg/m^2Dosing weight: 73.5 kg (based on wt taken 7/4)? Additional wt information:Ht confirmed with pt. No wt hx since 2019 per EMR. Wts have fluctuated since admit, likely r/t scale use vs fluid shifts vs some true wt change. Current wt down 4.3 kg (7.4%) from admit wt x 5 months, not significant for timeframe. 1+ edema noted per RN flowsheets. Moderate muscle wasting visualized in clavicle and temple region. Moderate fat wasting visualized in buccal and orbital regions. Will continue utilize 73.5 kg as dosing wt d/t frequent fluctuations over admission. Will monitor trends as available.?Wt: 06/24/22 73.5 kg 12/13/18 98.6 kg ?ESTIMATED NUTRITION REQUIREMENTS:Kcal/day: 1838           (25 kcal/kg)Protein/day: 74-88     (1-1.2 gm/kg)Fluids/day: 1838?mL/d?(1 mL/kcal) ONLY as medical and fluid status allows and only per team discretion.Needs based on: dosing wt (73.5 kg), maintenance, steroid?NUTRITION ASSESSMENT: Chart reviewed for follow up. Pt continues to tolerate PO intake w/ 100% meal intake documented per flowsheets. Labs and meds reviewed. Met w/ pt at bedside today. Reports 100% intake of 2-3 meals/day -- burgers, mac'n'cheese, caesar salad, deli sandwiches. No c/o n/v. Notes normal ostomy output during admission. Drinks 2 Ensure Clear/day consistently, sometimes 3. Requested adjusting order to BID as receiving 3 supplements/day is overwhelming -- multiple extra supplements noted at bedside. Overall, pt appears to be meeting nutrition needs at this time.?Cultural/Religious/Ethnic Needs: NoFood Allergies: None per pt?NUTRITION DIAGNOSIS:Severe malnutrition r/t depletion aeb moderate muscle and fat depletion. -- improving, continues?INTERVENTIONS/RECOMMENDATIONS: Meals and Snacks:-Continue Regular diet- Encourage small frequent meals/snacks- Encourage high kcal/protein foodsGoal:- Pt will meet >60% of estimated needs to d/c enteral nutrition - enteral nutrition d/c, will d/c goal - Pt will consume >/=1 protein rich food with each meal- met, will continue?Nutrition Supplement Therapy:- Adjust Ensure Clear Berry to BID per pt request?Goal: Pt to consume 2-3 Ensure Clear daily - met, continue ?Nutrition Education:- Previously discussed food sources of protein- Previously discussed foods to help thicken ostomy output?Discharge Planning and Transfer of Nutrition Care:  Nutrition related discharge needs still being determined at this time, will continue to follow?MONITORING / EVALUATION:?Food/Nutrition-Related History:Food and Nutrient Intake?Anthropometric Measurements:Weight ?Biochemical Data, Medical Tests and Procedures:Electrolyte and renal profileGlucose/endocrine profile?Nutrition-Focus Physical FindingsOverall findingsElectronically Signed by Danielle [name], dietetic intern, July 21, 2023X-Coverage for Danielle [name], [place] [phone] signed by Danielle Fredrickson, MS, RD, July 11, 2022, [facility] [phone] note: Nutrition is a consult only service on weekends and holidays.  Please enter a consult in EPIC if assistance is needed on a weekend or holiday or via M[facility] [phone] to contact the covering RD.

## 2022-07-11 NOTE — Plan of Care
Plan of Care Overview/ Patient Status    Per DPR, patient remains medically cleared pending placement. Plan is SNF LTC, pending T-19 approval. Patient's Sister/Conservator [name], working on T-19. [name] she is wanting [facility] [employer] and [facility] [place] for LTC; referrals under review. CM will continue to follow and assist. Melina Copa, MSW, LCSWCare [phone]

## 2022-07-11 NOTE — Progress Notes
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 Interim Events: Yesterday:- no clinical change- awaiting LTC placementOvernight: NAEToday: no new concernsMeds: Scheduled Meds:Current Facility-Administered Medications Medication Dose Route Frequency Provider Last Rate Last Admin ? diclofenac (VOLTAREN) 1 % gel 4 g  4 g Topical (Top) 4x Daily Larna Daughters, MD   4 g at 07/11/22 1022 ? enoxaparin (LOVENOX) injection 80 mg  80 mg Subcutaneous Q12H Abid, Bushra, MD   80 mg at 07/11/22 1020 ? famotidine (PEPCID) tablet 20 mg  20 mg Oral Daily Senaida Lange, MD   20 mg at 07/11/22 1022 ? ferrous gluconate (FERGON) tablet 324 mg  324 mg Oral Every Other Day Larna Daughters, MD   324 mg at 07/11/22 1020 ? gabapentin (NEURONTIN) capsule 100 mg  100 mg Oral TID Marlon Pel, MD   100 mg at 07/11/22 1020 ? lidocaine 4 % topical patch 1 patch  1 patch Transdermal Q24H Babette Relic, MD   1 patch at 07/10/22 1423 ? melatonin tablet 3 mg  3 mg Oral Nightly Pryor Ochoa, MD   3 mg at 07/10/22 2137 ? nystatin (MYCOSTATIN) powder 1 Application  1 Application  Topical (Top) BID Henriette Combs, MD   1 Application  at 07/10/22 2139 ? predniSONE (DELTASONE) tablet 5 mg  5 mg Oral Daily Jerral Ralph, MD   5 mg at 07/11/22 1020 ? rosuvastatin (CRESTOR) tablet 10 mg  10 mg Per G Tube Nightly Dorisann Frames, MD   10 mg at 07/10/22 2137 Continuous Infusions:PRN Meds:acetaminophen, dextrose (GLUCOSE) oral liquid 15 g **OR** fruit juice **OR** skim milk, dextrose (GLUCOSE) oral liquid 30 g **OR** fruit juice, dextrose injection, dextrose injection, traMADoLObjective: Vitals: Temp:  [97.4 ?F (36.3 ?C)-98.8 ?F (37.1 ?C)] 97.4 ?F (36.3 ?C)Pulse:  [66-88] 77Resp:  [15-18] 18BP: (94-98)/(57-63) 95/57SpO2:  [98 %-99 %] 99 %Device (Oxygen Therapy): room air Weight: 75.1 kg I/O's: Intake/Output Summary (Last 24 hours) at 07/11/2022 1125Last data filed at 07/11/2022 0615Gross per 24 hour Intake -- Output 900 ml Net -900 ml  Physical Exam:General:  in no acute distressHEENT: MMM, sclera anictericPulmonary: CTAB; normal respiratory effortCardiac: RRR, no M/R/GAbdomen: soft, non-tender, non-distended, no guarding or rebound; +BSNeuro: Alert and oriented x3; no gross deficitsExtremities: no LEE, contracted left foot/ankle with intact passive ROM but weakness in extension and eversion Labs: MetabolicRecent Labs Lab 07/19/230500 NA 140 K 4.3 CL 107 CO2 23 BUN 11 CREATININE 0.34* ANIONGAP 10 CALCIUM 10.4*  HematologicRecent Labs Lab 07/19/230500 WBC 5.5 HGB 11.4* HCT 37.10 PLT 286 NEUTROPHILS 42.1  HepaticRecent Labs Lab 07/19/230500 BILITOT <0.2 ALT 16 AST 16 ALKPHOS 58 PROT 7.0 ALBUMIN 3.6  SpecialNo results for input(s): URICACID, LDH, FIBRINOGEN, DDIMER, FERRITIN, CRP in the last 168 hours.Invalid input(s): ESR Micro:No newDiagnostics:No newECG/Tele Events: No newAssessment & Plan: 66 yo conserved F with PMHx RA, multiple abdominal surgeries, chronic hypercalcemia with prolonged hospital stay at OSH and subsequent transfer to [facility] on 02/11/22 for encephalopathy and FTT, now resolved, but remains inpatient pending LTC, awaiting T19 process. #DispositionPending T-19 approval. Her sister [name]) is her guardian, but she has written letter to probate court to revise her guardianship. SW is informed about the situation. Had arrangements for a spend down of funds in order to  meet eligibility requirements for her Title 60. 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 Concern she will likely remain bed bound given prolonged course.She had new joint pain in the shoulders and therefore ESR was ordered to see if this is RA. ESR was 45- cont PT x3 times/week- cont voltaren gel and lidocaine patchOld#HypercalcemiaStable Ca at 10.5 (Alb 3.6). Previously seen by Endocrine. Suspect 2/2 to prolonged immobolity vs primary hyperparathyroidism. 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

## 2022-07-12 NOTE — Plan of Care
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 +UO, refused?Bilateral LE in podus boots, safety maintained.Problem: Adult Inpatient Plan of CareGoal: Plan of Care ReviewOutcome: Interventions implemented as appropriateGoal: Patient-Specific Goal (Individualized)Outcome: Interventions implemented as appropriateGoal: Absence of Hospital-Acquired Illness or InjuryOutcome: Interventions implemented as appropriateGoal: Optimal Comfort and WellbeingOutcome: Interventions implemented as appropriateGoal: Readiness for Transition of CareOutcome: Interventions implemented as appropriate Problem: InfectionGoal: Absence of Infection Signs and SymptomsOutcome: Interventions implemented as appropriate Problem: Physical Therapy GoalsGoal: Physical Therapy GoalsDescription: PT GOALS - addressed 05/02/22.1. Patient will demonstrate active range of motion x 4 extremities in preparation for mobility (GOAL MET)2. Caregivers will be independent and safely implement a range of motion/positioning program to prevent loss of range of motion and skin integrity (ONGOING)3. Patient will perform bed mobility with maximal assist (GOAL MET)4. Patient will tolerate sitting edge of bed >5 minutes with minimal assist (GOAL MET)5. Patient will perform transfers with moderate assist of 2 using rolling walker (ONGOING)6. Pt will perform bed mobility w/ modAx1 (ONGOING)7. Pt will perform STS transfer w/ modAx2 for a standing tolerance of 1 minute. (NEW) Outcome: Interventions implemented as appropriate Problem: Speech Language Pathology GoalsGoal: Dysphagia Goals, SLPDescription: Current goal: Patient will tolerate regular solids and thin liquids without adverse impact on respiratory status and while maintaining adequate oral nutrition.Prior goal (met): The patient will tolerate least restrictive diet without any decline in respiratory status.Prior goal (met): Pt will tolerate pureed diet with thin liquids w/o s/x of aspiration or adverse impact on respiratory status.Outcome: Interventions implemented as appropriate Problem: Impaired Wound HealingGoal: Optimal Wound HealingOutcome: Interventions implemented as appropriate Problem: Fall Injury RiskGoal: Absence of Fall and Fall-Related InjuryOutcome: Interventions implemented as appropriate Problem: Skin Injury Risk IncreasedGoal: Skin Health and IntegrityOutcome: Interventions implemented as appropriate Problem: Mobility ImpairmentGoal: Optimal MobilityOutcome: Interventions implemented as appropriate Problem: Pain AcuteGoal: Acceptable Pain Control and Functional AbilityOutcome: Interventions implemented as appropriate Problem: Self-Care DeficitGoal: Improved Ability to Complete Activities of Daily LivingOutcome: Interventions implemented as appropriate Problem: Fluid, Electrolyte and Nutrition Imbalance (Ileostomy)Goal: Fluid, Electrolyte and Nutrition BalanceOutcome: Interventions implemented as appropriate Problem: Infection (Ileostomy)Goal: Absence of Infection Signs and SymptomsOutcome: Interventions implemented as appropriate Problem: Confusion ChronicGoal: Optimal Cognitive FunctionOutcome: Interventions implemented as appropriate

## 2022-07-12 NOTE — Plan of Care
Plan of Care Overview/ Patient Status    1900-0700: A&Ox4. VSS on RA. Complaint of left knee pain, prn tramadol given with good effect. Regular diet, good po intake. Incontinent of urine, purewick in place, changed this shift. Ileostomy in place, draining brown stool, output marked in flowsheet, bag changed during dayshift. Skin assessment documented in flowsheet. Ax2 in bed. No access, providers aware. Daily vitals. Pills whole. No acute events this shift. Safety maintained. Call bell in reach. Bed alarm on. Hourly rounding completed.Problem: Adult Inpatient Plan of CareGoal: Plan of Care ReviewOutcome: Interventions implemented as appropriateGoal: Patient-Specific Goal (Individualized)Outcome: Interventions implemented as appropriateGoal: Absence of Hospital-Acquired Illness or InjuryOutcome: Interventions implemented as appropriateGoal: Optimal Comfort and WellbeingOutcome: Interventions implemented as appropriateGoal: Readiness for Transition of CareOutcome: Interventions implemented as appropriate Problem: InfectionGoal: Absence of Infection Signs and SymptomsOutcome: Interventions implemented as appropriate Problem: Physical Therapy GoalsGoal: Physical Therapy GoalsDescription: PT GOALS - addressed 05/02/22.1. Patient will demonstrate active range of motion x 4 extremities in preparation for mobility (GOAL MET)2. Caregivers will be independent and safely implement a range of motion/positioning program to prevent loss of range of motion and skin integrity (ONGOING)3. Patient will perform bed mobility with maximal assist (GOAL MET)4. Patient will tolerate sitting edge of bed >5 minutes with minimal assist (GOAL MET)5. Patient will perform transfers with moderate assist of 2 using rolling walker (ONGOING)6. Pt will perform bed mobility w/ modAx1 (ONGOING)7. Pt will perform STS transfer w/ modAx2 for a standing tolerance of 1 minute. (NEW) Outcome: Interventions implemented as appropriate Problem: Occupational Therapy GoalsGoal: Occupational Therapy GoalsDescription: OT goals1. Pt will move to EOB for seated ADL mod I2. Pt will follow 3/4 simple one step motor commands IND3. Pt will sequence through set up ADL task INDOT re-eval, Initial goals not met, continue to progress towards initial goalsOutcome: Interventions implemented as appropriate Problem: Impaired Wound HealingGoal: Optimal Wound HealingOutcome: Interventions implemented as appropriate Problem: Fall Injury RiskGoal: Absence of Fall and Fall-Related InjuryOutcome: Interventions implemented as appropriate Problem: Skin Injury Risk IncreasedGoal: Skin Health and IntegrityOutcome: Interventions implemented as appropriate Problem: Mobility ImpairmentGoal: Optimal MobilityOutcome: Interventions implemented as appropriate Problem: Pain AcuteGoal: Acceptable Pain Control and Functional AbilityOutcome: Interventions implemented as appropriate Problem: Speech Language Pathology GoalsGoal: Dysphagia Goals, SLPDescription: Current goal: Patient will tolerate regular solids and thin liquids without adverse impact on respiratory status and while maintaining adequate oral nutrition.Prior goal (met): The patient will tolerate least restrictive diet without any decline in respiratory status.Prior goal (met): Pt will tolerate pureed diet with thin liquids w/o s/x of aspiration or adverse impact on respiratory status.Outcome: Interventions implemented as appropriate Problem: Self-Care DeficitGoal: Improved Ability to Complete Activities of Daily LivingOutcome: Interventions implemented as appropriate Problem: Fluid, Electrolyte and Nutrition Imbalance (Ileostomy)Goal: Fluid, Electrolyte and Nutrition BalanceOutcome: Interventions implemented as appropriate Problem: Infection (Ileostomy)Goal: Absence of Infection Signs and SymptomsOutcome: Interventions implemented as appropriate Problem: Confusion ChronicGoal: Optimal Cognitive FunctionOutcome: Interventions implemented as appropriate

## 2022-07-13 NOTE — Plan of Care
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?covered with dressing,right brown liquid stool, rosebud pink,  Refused assisted QT&Rq2hr can shift weight self minimally, independent for oral care, +UO, refused?Bilateral LE in podus boots, SCDs, safety maintained.?Problem: Adult Inpatient Plan of CareGoal: Plan of Care Review7/23/2023 1019 by Willette Alma, RNOutcome: Interventions implemented as appropriate7/23/2023 1019 by Willette Alma, RNOutcome: Interventions implemented as appropriateGoal: Patient-Specific Goal (Individualized)07/13/2022 1019 by Willette Alma, RNOutcome: Interventions implemented as appropriate7/23/2023 1019 by Willette Alma, RNOutcome: Interventions implemented as appropriateGoal: Absence of Hospital-Acquired Illness or Injury7/23/2023 1019 by Willette Alma, RNOutcome: Interventions implemented as appropriate7/23/2023 1019 by Willette Alma, RNOutcome: Interventions implemented as appropriateGoal: Optimal Comfort and Wellbeing7/23/2023 1019 by Willette Alma, RNOutcome: Interventions implemented as appropriate7/23/2023 1019 by Willette Alma, RNOutcome: Interventions implemented as appropriateGoal: Readiness for Transition of Care7/23/2023 1019 by Willette Alma, RNOutcome: Interventions implemented as appropriate7/23/2023 1019 by Willette Alma, RNOutcome: Interventions implemented as appropriate Problem: InfectionGoal: Absence of Infection Signs and Symptoms7/23/2023 1019 by Willette Alma, RNOutcome: Interventions implemented as appropriate7/23/2023 1019 by Willette Alma, RNOutcome: Interventions implemented as appropriate Problem: Physical Therapy GoalsGoal: Physical Therapy GoalsDescription: PT GOALS - addressed 05/02/22.1. Patient will demonstrate active range of motion x 4 extremities in preparation for mobility (GOAL MET)2. Caregivers will be independent and safely implement a range of motion/positioning program to prevent loss of range of motion and skin integrity (ONGOING)3. Patient will perform bed mobility with maximal assist (GOAL MET)4. Patient will tolerate sitting edge of bed >5 minutes with minimal assist (GOAL MET)5. Patient will perform transfers with moderate assist of 2 using rolling walker (ONGOING)6. Pt will perform bed mobility w/ modAx1 (ONGOING)7. Pt will perform STS transfer w/ modAx2 for a standing tolerance of 1 minute. (NEW) 07/13/2022 1019 by Willette Alma, RNOutcome: Interventions implemented as appropriate7/23/2023 1019 by Willette Alma, RNOutcome: Interventions implemented as appropriate Problem: Occupational Therapy GoalsGoal: Occupational Therapy GoalsDescription: OT goals1. Pt will move to EOB for seated ADL mod I2. Pt will follow 3/4 simple one step motor commands IND3. Pt will sequence through set up ADL task INDOT re-eval, Initial goals not met, continue to progress towards initial goals7/23/2023 1019 by Willette Alma, RNOutcome: Interventions implemented as appropriate7/23/2023 1019 by Willette Alma, RNOutcome: Interventions implemented as appropriate Problem: Speech Language Pathology GoalsGoal: Dysphagia Goals, SLPDescription: Current goal: Patient will tolerate regular solids and thin liquids without adverse impact on respiratory status and while maintaining adequate oral nutrition.Prior goal (met): The patient will tolerate least restrictive diet without any decline in respiratory status.Prior goal (met): Pt will tolerate pureed diet with thin liquids w/o s/x of aspiration or adverse impact on respiratory status.07/13/2022 1019 by Willette Alma, RNOutcome: Interventions implemented as appropriate7/23/2023 1019 by Willette Alma, RNOutcome: Interventions implemented as appropriate Problem: Impaired Wound HealingGoal: Optimal Wound Healing7/23/2023 1019 by Willette Alma, RNOutcome: Interventions implemented as appropriate7/23/2023 1019 by Willette Alma, RNOutcome: Interventions implemented as appropriate Problem: Fall Injury RiskGoal: Absence of Fall and Fall-Related Injury7/23/2023 1019 by Willette Alma, RNOutcome: Interventions implemented as appropriate7/23/2023 1019 by Willette Alma, RNOutcome: Interventions implemented as appropriate Problem: Skin Injury Risk IncreasedGoal: Skin Health and Integrity7/23/2023 1019 by Willette Alma, RNOutcome: Interventions implemented as appropriate7/23/2023 1019 by Willette Alma, RNOutcome: Interventions implemented as appropriate Problem: Mobility ImpairmentGoal: Optimal Mobility7/23/2023 1019 by Willette Alma, RNOutcome: Interventions implemented as appropriate7/23/2023 1019 by Willette Alma, RNOutcome: Interventions implemented as appropriate Problem: Pain AcuteGoal: Acceptable Pain Control and Functional Ability7/23/2023 1019 by Willette Alma, RNOutcome: Interventions implemented as appropriate7/23/2023 1019 by Willette Alma, RNOutcome: Interventions implemented as  appropriate Problem: Self-Care DeficitGoal: Improved Ability to Complete Activities of Daily Living7/23/2023 1019 by Willette Alma, RNOutcome: Interventions implemented as appropriate7/23/2023 1019 by Willette Alma, RNOutcome: Interventions implemented as appropriate Problem: Fluid, Electrolyte and Nutrition Imbalance (Ileostomy)Goal: Fluid, Electrolyte and Nutrition Balance7/23/2023 1019 by Willette Alma, RNOutcome: Interventions implemented as appropriate7/23/2023 1019 by Willette Alma, RNOutcome: Interventions implemented as appropriate Problem: Infection (Ileostomy)Goal: Absence of Infection Signs and Symptoms7/23/2023 1019 by Quanell Loughney, RNOutcome: Interventions implemented as appropriate7/23/2023 1019 by Willette Alma, RNOutcome: Interventions implemented as appropriate Problem: Confusion ChronicGoal: Optimal Cognitive Function7/23/2023 1019 by Willette Alma, RNOutcome: Interventions implemented as appropriate7/23/2023 1019 by Willette Alma, RNOutcome: Interventions implemented as appropriate

## 2022-07-13 NOTE — Progress Notes
[facility]	 [facility]	Medicine Progress NoteAttending Provider: Haskell Riling, MDDate of Admission: 2/21/2023Patient Summary: 66 yo conserved F with PMHx RA, multiple abdominal surgeries, chronic hypercalcemia with prolonged hospital stay at OSH and subsequent transfer to [facility] on 02/11/22 for encephalopathy and FTT, now resolved, but remains inpatient pending LTC, awaiting T19 process. Interim Events: Yesterday:- no clinical change- awaiting LTC placementOvernight: NAEToday: - no new concernsMeds: Scheduled Meds:Current Facility-Administered Medications Medication Dose Route Frequency Provider Last Rate Last Admin ? diclofenac (VOLTAREN) 1 % gel 4 g  4 g Topical (Top) 4x Daily Larna Daughters, MD   4 g at 07/13/22 0849 ? enoxaparin (LOVENOX) injection 80 mg  80 mg Subcutaneous Q12H Abid, Bushra, MD   80 mg at 07/13/22 0848 ? famotidine (PEPCID) tablet 20 mg  20 mg Oral Daily Senaida Lange, MD   20 mg at 07/13/22 0848 ? ferrous gluconate (FERGON) tablet 324 mg  324 mg Oral Every Other Day Larna Daughters, MD   324 mg at 07/13/22 0848 ? gabapentin (NEURONTIN) capsule 100 mg  100 mg Oral TID Marlon Pel, MD   100 mg at 07/13/22 0848 ? lidocaine 4 % topical patch 1 patch  1 patch Transdermal Q24H Babette Relic, MD   1 patch at 07/12/22 1405 ? melatonin tablet 3 mg  3 mg Oral Nightly Pryor Ochoa, MD   3 mg at 07/12/22 2108 ? nystatin (MYCOSTATIN) powder 1 Application  1 Application  Topical (Top) BID Henriette Combs, MD   1 Application  at 07/13/22 0848 ? predniSONE (DELTASONE) tablet 5 mg  5 mg Oral Daily Jerral Ralph, MD   5 mg at 07/13/22 0848 ? rosuvastatin (CRESTOR) tablet 10 mg  10 mg Per G Tube Nightly Dorisann Frames, MD   10 mg at 07/12/22 2108 Continuous Infusions:PRN Meds:acetaminophen, dextrose (GLUCOSE) oral liquid 15 g **OR** fruit juice **OR** skim milk, dextrose (GLUCOSE) oral liquid 30 g **OR** fruit juice, dextrose injection, dextrose injection, traMADoLObjective: Vitals: Temp:  [97.6 ?F (36.4 ?C)-98.1 ?F (36.7 ?C)] 97.6 ?F (36.4 ?C)Pulse:  [70-79] 70Resp:  [16-20] 20BP: (92-113)/(66-75) 113/75SpO2:  [96 %-100 %] 100 %Device (Oxygen Therapy): room air Weight: 75.1 kg I/O's: Intake/Output Summary (Last 24 hours) at 07/13/2022 0950Last data filed at 07/12/2022 2356Gross per 24 hour Intake -- Output 700 ml Net -700 ml  Physical Exam:General:  in no acute distressHEENT: MMM, sclera anictericPulmonary: CTAB; normal respiratory effortCardiac: RRR, no M/R/GAbdomen: soft, non-tender, non-distended, no guarding or rebound; +BSNeuro: Alert and oriented x3; no gross deficitsExtremities: no LEE, contracted left foot/ankle with intact passive ROM but weakness in extension and eversion Labs: MetabolicRecent Labs Lab 07/19/230500 NA 140 K 4.3 CL 107 CO2 23 BUN 11 CREATININE 0.34* ANIONGAP 10 CALCIUM 10.4*  HematologicRecent Labs Lab 07/19/230500 WBC 5.5 HGB 11.4* HCT 37.10 PLT 286 NEUTROPHILS 42.1  HepaticRecent Labs Lab 07/19/230500 BILITOT <0.2 ALT 16 AST 16 ALKPHOS 58 PROT 7.0 ALBUMIN 3.6  SpecialNo results for input(s): URICACID, LDH, FIBRINOGEN, DDIMER, FERRITIN, CRP in the last 168 hours.Invalid input(s): ESR Micro:No newDiagnostics:No newECG/Tele Events: No newAssessment & Plan: 66 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 #DispositionPending T-19 approval. Her sister [name]) is her guardian, but she has written letter to probate court to revise her guardianship. SW is informed about the situation. Had arrangements for a spend down of funds in order  to meet eligibility requirements for her Title 64. 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 Concern she will likely remain bed bound given prolonged course.She had new joint pain in the shoulders and therefore ESR was ordered to see if this is RA. ESR was 45- cont PT x3 times/week- cont voltaren gel and lidocaine patch#HypercalcemiaStable Ca at 10.5 (Alb 3.6). Previously seen by Endocrine. Suspect 2/2 to prolonged immobolity vs primary hyperparathyroidism. 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

## 2022-07-13 NOTE — Plan of Care
Plan of Care Overview/ Patient Status    1600-1930 Assumed pt care. A&Ox4. VSS on RA. Ileostomy in place draining brown liquid. See flowsheet for output. Purewick in place, urine is cloudy yellow. Tylenol and tramadol given for L foot pain. Voltaren cream also applied to L foot. Tolerating diet. Performing exercises in bed with resistance bands. Refusing T&R. Refusing podus boots. Safety maintained. callbell within reach. Hourly rounding.

## 2022-07-13 NOTE — Progress Notes
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 Interim Events: Yesterday:- no clinical change- awaiting LTC placementOvernight: NAEToday: - no new concernsMeds: Scheduled Meds:Current Facility-Administered Medications Medication Dose Route Frequency Provider Last Rate Last Admin ? diclofenac (VOLTAREN) 1 % gel 4 g  4 g Topical (Top) 4x Daily Larna Daughters, MD   4 g at 07/11/22 2057 ? enoxaparin (LOVENOX) injection 80 mg  80 mg Subcutaneous Q12H Abid, Bushra, MD   80 mg at 07/11/22 2056 ? famotidine (PEPCID) tablet 20 mg  20 mg Oral Daily Senaida Lange, MD   20 mg at 07/11/22 1022 ? ferrous gluconate (FERGON) tablet 324 mg  324 mg Oral Every Other Day Larna Daughters, MD   324 mg at 07/11/22 1020 ? gabapentin (NEURONTIN) capsule 100 mg  100 mg Oral TID Marlon Pel, MD   100 mg at 07/11/22 2056 ? lidocaine 4 % topical patch 1 patch  1 patch Transdermal Q24H Babette Relic, MD   1 patch at 07/11/22 1517 ? melatonin tablet 3 mg  3 mg Oral Nightly Pryor Ochoa, MD   3 mg at 07/11/22 2056 ? nystatin (MYCOSTATIN) powder 1 Application  1 Application  Topical (Top) BID Henriette Combs, MD   1 Application  at 07/10/22 2139 ? predniSONE (DELTASONE) tablet 5 mg  5 mg Oral Daily Jerral Ralph, MD   5 mg at 07/11/22 1020 ? rosuvastatin (CRESTOR) tablet 10 mg  10 mg Per G Tube Nightly Dorisann Frames, MD   10 mg at 07/11/22 2056 Continuous Infusions:PRN Meds:acetaminophen, dextrose (GLUCOSE) oral liquid 15 g **OR** fruit juice **OR** skim milk, dextrose (GLUCOSE) oral liquid 30 g **OR** fruit juice, dextrose injection, dextrose injection, traMADoLObjective: Vitals: Temp:  [97.8 ?F (36.6 ?C)-98 ?F (36.7 ?C)] 97.8 ?F (36.6 ?C)Pulse:  [61-70] 61Resp:  [18] 18BP: (100-104)/(65-70) 104/70SpO2:  [96 %-99 %] 99 %Device (Oxygen Therapy): room air Weight: 75.1 kg I/O's: Intake/Output Summary (Last 24 hours) at 07/12/2022 0908Last data filed at 07/11/2022 2100Gross per 24 hour Intake -- Output 350 ml Net -350 ml  Physical Exam:General:  in no acute distressHEENT: MMM, sclera anictericPulmonary: CTAB; normal respiratory effortCardiac: RRR, no M/R/GAbdomen: soft, non-tender, non-distended, no guarding or rebound; +BSNeuro: Alert and oriented x3; no gross deficitsExtremities: no LEE, contracted left foot/ankle with intact passive ROM but weakness in extension and eversion Labs: MetabolicRecent Labs Lab 07/19/230500 NA 140 K 4.3 CL 107 CO2 23 BUN 11 CREATININE 0.34* ANIONGAP 10 CALCIUM 10.4*  HematologicRecent Labs Lab 07/19/230500 WBC 5.5 HGB 11.4* HCT 37.10 PLT 286 NEUTROPHILS 42.1  HepaticRecent Labs Lab 07/19/230500 BILITOT <0.2 ALT 16 AST 16 ALKPHOS 58 PROT 7.0 ALBUMIN 3.6  SpecialNo results for input(s): URICACID, LDH, FIBRINOGEN, DDIMER, FERRITIN, CRP in the last 168 hours.Invalid input(s): ESR Micro:No newDiagnostics:No newECG/Tele Events: No newAssessment & Plan: 66 yo conserved F with PMHx RA, multiple abdominal surgeries, chronic hypercalcemia with prolonged hospital stay at OSH and subsequent transfer to [facility] on 02/11/22 for encephalopathy and FTT, now resolved, but remains inpatient pending LTC, awaiting T19 process. #DispositionPending T-19 approval. Her sister [name]) is her guardian, but she has written letter to probate court to revise her guardianship. SW is informed about the situation. Had arrangements for a spend down of funds in  order to meet eligibility requirements for her Title 1. 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 Concern she will likely remain bed bound given prolonged course.She had new joint pain in the shoulders and therefore ESR was ordered to see if this is RA. ESR was 45- cont PT x3 times/week- cont voltaren gel and lidocaine patchOld#HypercalcemiaStable Ca at 10.5 (Alb 3.6). Previously seen by Endocrine. Suspect 2/2 to prolonged immobolity vs primary hyperparathyroidism. 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

## 2022-07-13 NOTE — Plan of Care
Plan of Care Overview/ Patient Status    1900-0700: A&Ox4. VSS on RA. Complaint of right knee pain, scheduled cream applied. Regular diet, good po intake. Incontinent of urine, purewick in place, changed this shift. Ileostomy in place, draining brown stool, output marked in flowsheet, bag changed during dayshift. Skin assessment documented in flowsheet. Ax2 in bed. Turned and repositioned q 2hr. Refusing podus boots. No access, providers aware. Daily vitals. Pills whole. No acute events this shift. Safety maintained. Call bell in reach. Bed alarm on. Hourly rounding completed.

## 2022-07-14 NOTE — Progress Notes
[facility]	 [facility]	Medicine Progress NoteAttending Provider: Haskell Riling, MDDate of Admission: 2/21/2023Patient Summary: 66 yo conserved F with PMHx RA, multiple abdominal surgeries, chronic hypercalcemia with prolonged hospital stay at OSH and subsequent transfer to [facility] on 02/11/22 for encephalopathy and FTT, now resolved, but remains inpatient pending LTC, awaiting T19 process. Interim Events: Yesterday:- no clinical change- awaiting LTC placementOvernight: NAEToday: - no new concernsMeds: Scheduled Meds:Current Facility-Administered Medications Medication Dose Route Frequency Provider Last Rate Last Admin ? diclofenac (VOLTAREN) 1 % gel 4 g  4 g Topical (Top) 4x Daily Larna Daughters, MD   4 g at 07/14/22 0803 ? enoxaparin (LOVENOX) injection 80 mg  80 mg Subcutaneous Q12H Abid, Bushra, MD   80 mg at 07/14/22 0802 ? famotidine (PEPCID) tablet 20 mg  20 mg Oral Daily Senaida Lange, MD   20 mg at 07/14/22 0802 ? ferrous gluconate (FERGON) tablet 324 mg  324 mg Oral Every Other Day Larna Daughters, MD   324 mg at 07/13/22 0848 ? gabapentin (NEURONTIN) capsule 100 mg  100 mg Oral TID Marlon Pel, MD   100 mg at 07/14/22 0802 ? lidocaine 4 % topical patch 1 patch  1 patch Transdermal Q24H Babette Relic, MD   1 patch at 07/13/22 1432 ? melatonin tablet 3 mg  3 mg Oral Nightly Pryor Ochoa, MD   3 mg at 07/13/22 2051 ? nystatin (MYCOSTATIN) powder 1 Application  1 Application  Topical (Top) BID Henriette Combs, MD   1 Application  at 07/14/22 [phone] ? predniSONE (DELTASONE) tablet 5 mg  5 mg Oral Daily Jerral Ralph, MD   5 mg at 07/14/22 0802 ? rosuvastatin (CRESTOR) tablet 10 mg  10 mg Per G Tube Nightly Dorisann Frames, MD   10 mg at 07/13/22 2051 Continuous Infusions:PRN Meds:acetaminophen, dextrose (GLUCOSE) oral liquid 15 g **OR** fruit juice **OR** skim milk, dextrose (GLUCOSE) oral liquid 30 g **OR** fruit juice, dextrose injection, dextrose injection, traMADoLObjective: Vitals: Temp:  [98.1 ?F (36.7 ?C)-98.2 ?F (36.8 ?C)] 98.2 ?F (36.8 ?C)Pulse:  [69-88] 69Resp:  [20] 20BP: (95)/(59-62) 95/59SpO2:  [97 %-99 %] 99 %Device (Oxygen Therapy): room air Weight: 75.1 kg I/O's: Intake/Output Summary (Last 24 hours) at 07/14/2022 1017Last data filed at 07/13/2022 2237Gross per 24 hour Intake -- Output 250 ml Net -250 ml  Physical Exam:General:  in no acute distressHEENT: MMM, sclera anictericPulmonary: CTAB; normal respiratory effortCardiac: RRR, no M/R/GAbdomen: soft, non-tender, non-distended, no guarding or rebound; +BSNeuro: Alert and oriented x3; no gross deficitsExtremities: no LEE, contracted left foot/ankle with intact passive ROM but weakness in extension and eversion Labs: MetabolicRecent Labs Lab 07/19/230500 NA 140 K 4.3 CL 107 CO2 23 BUN 11 CREATININE 0.34* ANIONGAP 10 CALCIUM 10.4*  HematologicRecent Labs Lab 07/19/230500 WBC 5.5 HGB 11.4* HCT 37.10 PLT 286 NEUTROPHILS 42.1  HepaticRecent Labs Lab 07/19/230500 BILITOT <0.2 ALT 16 AST 16 ALKPHOS 58 PROT 7.0 ALBUMIN 3.6  SpecialNo results for input(s): URICACID, LDH, FIBRINOGEN, DDIMER, FERRITIN, CRP in the last 168 hours.Invalid input(s): ESR Micro:No newDiagnostics:No newECG/Tele Events: No newAssessment & Plan: 66 yo conserved F with PMHx RA, multiple abdominal surgeries, chronic hypercalcemia with prolonged hospital stay at OSH and subsequent transfer to [facility] on 02/11/22 for encephalopathy and FTT, now resolved, but remains inpatient pending LTC, awaiting T19 process. #DispositionPending T-19 approval. Her sister [name]) is her guardian, but she has written letter to probate court to revise her guardianship. SW is informed about the situation. Had arrangements for a spend down of funds in order  to meet eligibility requirements for her Title 106. 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 Concern she will likely remain bed bound given prolonged course.She had new joint pain in the shoulders and therefore ESR was ordered to see if this is RA. ESR was 45- cont PT x3 times/week- cont voltaren gel and lidocaine patch#HypercalcemiaStable Ca at 10.5 (Alb 3.6). Previously seen by Endocrine. Suspect 2/2 to prolonged immobolity vs primary hyperparathyroidism. She is s/p reclast x1 with Endocrine on 3/23.- cont PT x3 times/week- weekly labs- will need to f/u with Endocrine regarding need for bisphosphonate therapy (outpatient)- will likely need DEXA scan as an outpatientChronic# non-occlusive DVT despite being on Eliquis for previous DVT- Continue lovenox 80mg  BID?# Iron Deficiency- Continue ferrous gluconate 324 mg PO QOD ?# Chronic polyneuropathy- Continue gabapentin 100 mg TIDDIET: regularPPX: LovenoxCODE STATUS: Full CodeDISPO:  LTC (pending T-19)Signed:Damek Ende, MDYale Internal MedicineReachable by MHBJuly 24, 2023

## 2022-07-14 NOTE — Plan of Care
Plan of Care Overview/ Patient Status    0700-1900Patient is A&Ox3-4 forgetful at times. VSS on RA. C/O left hip pain and left ankle pain. PRN tramadol and tylenol administered. Takes pills whole. Illeostomy  w/ good output. AX2 turn and reposition q2hr. Blanchable redness to L heel, heels elevated. Specialty bed in place, Safety checks and hourly rounding performed, see flow sheet and MAR for detailsProblem: Adult Inpatient Plan of CareGoal: Plan of Care ReviewOutcome: Interventions implemented as appropriateGoal: Patient-Specific Goal (Individualized)Outcome: Interventions implemented as appropriateGoal: Absence of Hospital-Acquired Illness or InjuryOutcome: Interventions implemented as appropriateGoal: Optimal Comfort and WellbeingOutcome: Interventions implemented as appropriateGoal: Readiness for Transition of CareOutcome: Interventions implemented as appropriate Problem: Physical Therapy GoalsGoal: Physical Therapy GoalsDescription: PT GOALS - addressed 05/02/22.1. Patient will demonstrate active range of motion x 4 extremities in preparation for mobility (GOAL MET)2. Caregivers will be independent and safely implement a range of motion/positioning program to prevent loss of range of motion and skin integrity (ONGOING)3. Patient will perform bed mobility with maximal assist (GOAL MET)4. Patient will tolerate sitting edge of bed >5 minutes with minimal assist (GOAL MET)5. Patient will perform transfers with moderate assist of 2 using rolling walker (ONGOING)6. Pt will perform bed mobility w/ modAx1 (ONGOING)7. Pt will perform STS transfer w/ modAx2 for a standing tolerance of 1 minute. (NEW) Outcome: Interventions implemented as appropriate Problem: Occupational Therapy GoalsGoal: Occupational Therapy GoalsDescription: OT goals1. Pt will move to EOB for seated ADL mod I2. Pt will follow 3/4 simple one step motor commands IND3. Pt will sequence through set up ADL task INDOT re-eval, Initial goals not met, continue to progress towards initial goalsOutcome: Interventions implemented as appropriate Problem: Speech Language Pathology GoalsGoal: Dysphagia Goals, SLPDescription: Current goal: Patient will tolerate regular solids and thin liquids without adverse impact on respiratory status and while maintaining adequate oral nutrition.Prior goal (met): The patient will tolerate least restrictive diet without any decline in respiratory status.Prior goal (met): Pt will tolerate pureed diet with thin liquids w/o s/x of aspiration or adverse impact on respiratory status.Outcome: Interventions implemented as appropriate Problem: Impaired Wound HealingGoal: Optimal Wound HealingOutcome: Interventions implemented as appropriate Problem: Fall Injury RiskGoal: Absence of Fall and Fall-Related InjuryOutcome: Interventions implemented as appropriate Problem: Skin Injury Risk IncreasedGoal: Skin Health and IntegrityOutcome: Interventions implemented as appropriate Problem: Mobility ImpairmentGoal: Optimal MobilityOutcome: Interventions implemented as appropriate Problem: Pain AcuteGoal: Acceptable Pain Control and Functional AbilityOutcome: Interventions implemented as appropriate Problem: Self-Care DeficitGoal: Improved Ability to Complete Activities of Daily LivingOutcome: Interventions implemented as appropriate Problem: Fluid, Electrolyte and Nutrition Imbalance (Ileostomy)Goal: Fluid, Electrolyte and Nutrition BalanceOutcome: Interventions implemented as appropriate Problem: Infection (Ileostomy)Goal: Absence of Infection Signs and SymptomsOutcome: Interventions implemented as appropriate Problem: Confusion ChronicGoal: Optimal Cognitive FunctionOutcome: Interventions implemented as appropriate

## 2022-07-14 NOTE — Plan of Care
Plan of Care Overview/ Patient Status    1900-0700- AOX4, pleasant. I have not cared for Pt in a couple of months and was surprised she remembered my name and a couple specific funny moments we shared. Mentation has improved a lot. No acute events overnight.Ileostomy putting out mushy, sticky, brown stool. Purewick in place, catheter care provided, purewick replaced. L foot pain well controlled with tramadol, tylenol and and volteran. Skin is intact. Pt has been sleeping comfortably through the night. Bed alarm is on, hourly rounding completed.

## 2022-07-15 NOTE — Plan of Care
Plan of Care Overview/ Patient Status    0700-1900Patient is A&Ox3-4 forgetful at times. VSS on RA. C/O left hip pain and left ankle pain. PRN tramadol and tylenol administered. Takes pills whole. Illeostomy  w/ good output. AX2 turn and reposition q2hr. Blanchable redness to L heel, heels elevated. Specialty bed in place, Safety checks and hourly rounding performed, see flow sheet and MAR for detailsProblem: Adult Inpatient Plan of CareGoal: Plan of Care ReviewOutcome: Interventions implemented as appropriateGoal: Patient-Specific Goal (Individualized)Outcome: Interventions implemented as appropriateGoal: Absence of Hospital-Acquired Illness or InjuryOutcome: Interventions implemented as appropriateGoal: Optimal Comfort and WellbeingOutcome: Interventions implemented as appropriateGoal: Readiness for Transition of CareOutcome: Interventions implemented as appropriate Problem: InfectionGoal: Absence of Infection Signs and SymptomsOutcome: Interventions implemented as appropriate Problem: Physical Therapy GoalsGoal: Physical Therapy GoalsDescription: PT GOALS - addressed 05/02/22.1. Patient will demonstrate active range of motion x 4 extremities in preparation for mobility (GOAL MET)2. Caregivers will be independent and safely implement a range of motion/positioning program to prevent loss of range of motion and skin integrity (ONGOING)3. Patient will perform bed mobility with maximal assist (GOAL MET)4. Patient will tolerate sitting edge of bed >5 minutes with minimal assist (GOAL MET)5. Patient will perform transfers with moderate assist of 2 using rolling walker (ONGOING)6. Pt will perform bed mobility w/ modAx1 (ONGOING)7. Pt will perform STS transfer w/ modAx2 for a standing tolerance of 1 minute. (NEW) Outcome: Interventions implemented as appropriate Problem: Occupational Therapy GoalsGoal: Occupational Therapy GoalsDescription: OT goals1. Pt will move to EOB for seated ADL mod I2. Pt will follow 3/4 simple one step motor commands IND3. Pt will sequence through set up ADL task INDOT re-eval, Initial goals not met, continue to progress towards initial goalsOutcome: Interventions implemented as appropriate Problem: Speech Language Pathology GoalsGoal: Dysphagia Goals, SLPDescription: Current goal: Patient will tolerate regular solids and thin liquids without adverse impact on respiratory status and while maintaining adequate oral nutrition.Prior goal (met): The patient will tolerate least restrictive diet without any decline in respiratory status.Prior goal (met): Pt will tolerate pureed diet with thin liquids w/o s/x of aspiration or adverse impact on respiratory status.Outcome: Interventions implemented as appropriate Problem: Impaired Wound HealingGoal: Optimal Wound HealingOutcome: Interventions implemented as appropriate Problem: Fall Injury RiskGoal: Absence of Fall and Fall-Related InjuryOutcome: Interventions implemented as appropriate Problem: Skin Injury Risk IncreasedGoal: Skin Health and IntegrityOutcome: Interventions implemented as appropriate Problem: Mobility ImpairmentGoal: Optimal MobilityOutcome: Interventions implemented as appropriate Problem: Pain AcuteGoal: Acceptable Pain Control and Functional AbilityOutcome: Interventions implemented as appropriate Problem: Self-Care DeficitGoal: Improved Ability to Complete Activities of Daily LivingOutcome: Interventions implemented as appropriate Problem: Fluid, Electrolyte and Nutrition Imbalance (Ileostomy)Goal: Fluid, Electrolyte and Nutrition BalanceOutcome: Interventions implemented as appropriate Problem: Infection (Ileostomy)Goal: Absence of Infection Signs and SymptomsOutcome: Interventions implemented as appropriate Problem: Confusion ChronicGoal: Optimal Cognitive FunctionOutcome: Interventions implemented as appropriate

## 2022-07-15 NOTE — Plan of Care
Plan of Care Overview/ Patient Status    1900-0700 Alert and oriented x3-4. VSS on RA. PRN tylenol and tramadol given for LLE pain with good effect. Right ileostomy intact and changed. Purewick in place. Incontinence care provided as needed. Awaiting T19/LTP. Turned and repositioned q2h as tolerated, refused at times. Safety maintained. Hourly rounding completed. See flowsheets for assessments.Problem: Adult Inpatient Plan of CareGoal: Plan of Care ReviewOutcome: Interventions implemented as appropriateFlowsheets (Taken 07/15/2022 0047)Progress: no changePlan of Care Reviewed With: patient

## 2022-07-15 NOTE — Plan of Care
Plan of Care Overview/ Patient Status    Medical Social Work Follow Up  [employer] Most Recent Value Admission Information  Document Type Progress Note Reason for Inability to Assess --  [Patient mental status is altered] (For Inpatient/ED Only) Prior psychosocial assessment has been documented within this hospitalization No (For Inpatient/ED Only) Prior psychosocial assessment has been documented within 30 days of this hospitalization No Reason for Current Social Work Regulatory affairs officer of Information Patient, Cabin crew Medical Team Comment Transition Coordination Community Agency Comment [place] [address] Record Reviewed Yes Level of Care Inpatient Psychosocial issues requiring intervention Support, resources Psychosocial interventions 10 minutes spent in direct contact with Manami. Krystena presented as pleasant and easily engaged. Heddy informed that she and [name] completed the funeral arrangements and she inquired about how long it would take to get the Title 19. I explained to her that Department of Social Service has to review the records and they will make a determination. Lamaya verbalized her understanding. I provided support. Danial appreciated my support and I thanked her for her time. I followed-up with Transition Coordination. [facility] Transition Coordination Specific referrals to enhance community supports (include existing and new resources) None Handoff Required? No Next Steps/Plan (including hand-off): I will continue to remain involved for support as needed. Signature: Leim Fabry, LCSW Contact Information: [phone]

## 2022-07-15 NOTE — Progress Notes
[facility]	 [facility]	Medicine Progress NoteAttending Provider: Haskell Riling, MDDate of Admission: 2/21/2023Patient Summary: Danielle [name] is a 66 yo conserved woman w/ PMH RA, multiple abdominal surgeries, chronic hypercalcemia with prolonged hospital stay at OSH and subsequent transfer to [facility] on 02/11/22 for encephalopathy and FTT, now resolved, but remains inpatient pending placement/T19 process. Interim Events: Overnight: - NAEOToday: - No new concerns, still w/ some ongoing L ankle discomfort, unchangedMeds: Scheduled Meds:Current Facility-Administered Medications Medication Dose Route Frequency Provider Last Rate Last Admin ? diclofenac (VOLTAREN) 1 % gel 4 g  4 g Topical (Top) 4x Daily Larna Daughters, MD   4 g at 07/15/22 1610 ? enoxaparin (LOVENOX) injection 80 mg  80 mg Subcutaneous Q12H Abid, Bushra, MD   80 mg at 07/15/22 0825 ? famotidine (PEPCID) tablet 20 mg  20 mg Oral Daily Senaida Lange, MD   20 mg at 07/15/22 9604 ? ferrous gluconate (FERGON) tablet 324 mg  324 mg Oral Every Other Day Larna Daughters, MD   324 mg at 07/15/22 5409 ? gabapentin (NEURONTIN) capsule 100 mg  100 mg Oral TID Marlon Pel, MD   100 mg at 07/15/22 8119 ? lidocaine 4 % topical patch 1 patch  1 patch Transdermal Q24H Babette Relic, MD   1 patch at 07/14/22 1310 ? melatonin tablet 3 mg  3 mg Oral Nightly Pryor Ochoa, MD   3 mg at 07/14/22 2038 ? nystatin (MYCOSTATIN) powder 1 Application  1 Application  Topical (Top) BID Henriette Combs, MD   1 Application  at 07/15/22 [phone] ? predniSONE (DELTASONE) tablet 5 mg  5 mg Oral Daily Jerral Ralph, MD   5 mg at 07/15/22 2956 ? rosuvastatin (CRESTOR) tablet 10 mg  10 mg Per G Tube Nightly Dorisann Frames, MD   10 mg at 07/14/22 2038 Continuous Infusions:PRN Meds:acetaminophen, dextrose (GLUCOSE) oral liquid 15 g **OR** fruit juice **OR** skim milk, dextrose (GLUCOSE) oral liquid 30 g **OR** fruit juice, dextrose injection, dextrose injection, traMADoLObjective: Vitals: Temp:  [97.8 ?F (36.6 ?C)] 97.8 ?F (36.6 ?C)Pulse:  [62] 62Resp:  [19] 19BP: (96)/(60) 96/60SpO2:  [99 %] 99 %Device (Oxygen Therapy): room air Weight: 75.1 kg I/O's: Intake/Output Summary (Last 24 hours) at 07/15/2022 1523Last data filed at 07/15/2022 1501Gross per 24 hour Intake -- Output 725 ml Net -725 ml  Physical Exam:General:  Laying in bed in no acute distressPulmonary: CTAB; normal respiratory effortCardiac: RRR, no M/R/GAbdomen: Soft, non-tender, non-distended, no guarding or rebound; +BSNeuro: Pleasant, calm, answering questions appropriatelyExtremities: Contracted left foot/ankle with mild swelling (stable), no erythema Labs: MetabolicRecent Labs Lab 07/19/230500 NA 140 K 4.3 CL 107 CO2 23 BUN 11 CREATININE 0.34* ANIONGAP 10 CALCIUM 10.4*  HematologicRecent Labs Lab 07/19/230500 WBC 5.5 HGB 11.4* HCT 37.10 PLT 286 NEUTROPHILS 42.1  HepaticRecent Labs Lab 07/19/230500 BILITOT <0.2 ALT 16 AST 16 ALKPHOS 58 PROT 7.0 ALBUMIN 3.6  SpecialNo results for input(s): URICACID, LDH, FIBRINOGEN, DDIMER, FERRITIN, CRP in the last 168 hours.Invalid input(s): ESR Micro:No newDiagnostics:No newECG/Tele Events: No newAssessment & Plan: [name] is a 66 yo conserved woman w/ PMH RA, multiple abdominal surgeries, chronic hypercalcemia with prolonged hospital stay at OSH and subsequent transfer to [facility] on 02/11/22 for encephalopathy and FTT, now resolved, but remains inpatient pending placement/T19 process. #DispositionPending T-19 approval/placement. 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 Previously seen by  Endocrine. ?2/2 to prolonged immobolity vs primary hyperparathyroidism. She is s/p Reclast x1 with Endocrine on 3/23.- Continue PT- Weekly labs- Will need to f/u with Endocrine regarding need for bisphosphonate therapy (outpatient)- Will likely need DEXA scan as an outpatient# Non-occlusive DVT - Despite being on Eliquis for previous DVT- Continue lovenox 80mg  BID?# Iron Deficiency- Continue ferrous gluconate 324 mg PO QOD ?# Chronic polyneuropathy- Continue gabapentin 100 mg TIDDIET: RegularPPX: LovenoxCODE STATUS: Full CodeDISPO:  LTC (pending T-19)Signed:Parris Cudworth Meredith Mody, MDYale Internal MedicineReachable by MHBJuly 25, 2023

## 2022-07-16 ENCOUNTER — Inpatient Hospital Stay: Admit: 2022-07-16 | Payer: PRIVATE HEALTH INSURANCE

## 2022-07-16 LAB — COMPREHENSIVE METABOLIC PANEL
BKR A/G RATIO: 0.9 — ABNORMAL LOW (ref 1.0–2.2)
BKR ALANINE AMINOTRANSFERASE (ALT): 12 U/L (ref 10–35)
BKR ALBUMIN: 3.3 g/dL — ABNORMAL LOW (ref 3.6–4.9)
BKR ALKALINE PHOSPHATASE: 59 U/L (ref 9–122)
BKR ANION GAP: 10 (ref 7–17)
BKR ASPARTATE AMINOTRANSFERASE (AST): 17 U/L (ref 10–35)
BKR AST/ALT RATIO: 1.4
BKR BILIRUBIN TOTAL: <0.2 mg/dL (ref <=1.2)
BKR BLOOD UREA NITROGEN: 13 mg/dL (ref 8–23)
BKR BUN / CREAT RATIO: 37.1 — ABNORMAL HIGH (ref 8.0–23.0)
BKR CALCIUM: 10.4 mg/dL — ABNORMAL HIGH (ref 8.8–10.2)
BKR CHLORIDE: 109 mmol/L — ABNORMAL HIGH (ref 98–107)
BKR CO2: 22 mmol/L (ref 20–30)
BKR CREATININE: 0.35 mg/dL — ABNORMAL LOW (ref 0.40–1.30)
BKR EGFR, CREATININE (CKD-EPI 2021): >60 mL/min/{1.73_m2} (ref >=60)
BKR GLOBULIN: 3.7 g/dL — ABNORMAL HIGH (ref 2.3–3.5)
BKR GLUCOSE: 92 mg/dL (ref 70–100)
BKR POTASSIUM: 4.1 mmol/L (ref 3.3–5.3)
BKR PROTEIN TOTAL: 7 g/dL (ref 6.6–8.7)
BKR SODIUM: 141 mmol/L (ref 136–144)

## 2022-07-16 LAB — CBC WITH AUTO DIFFERENTIAL
BKR WAM ABSOLUTE IMMATURE GRANULOCYTES.: 0.02 x 1000/ÂµL (ref 0.00–0.30)
BKR WAM ABSOLUTE LYMPHOCYTE COUNT.: 2.24 x 1000/ÂµL (ref 0.60–3.70)
BKR WAM ABSOLUTE NRBC (2 DEC): 0 x 1000/ÂµL (ref 0.00–1.00)
BKR WAM ANALYZER ANC: 2.92 x 1000/ÂµL (ref 2.00–7.60)
BKR WAM BASOPHIL ABSOLUTE COUNT.: 0.03 x 1000/ÂµL (ref 0.00–1.00)
BKR WAM BASOPHILS: 0.5 % (ref 0.0–1.4)
BKR WAM EOSINOPHIL ABSOLUTE COUNT.: 0.41 x 1000/ÂµL (ref 0.00–1.00)
BKR WAM EOSINOPHILS: 6.6 % — ABNORMAL HIGH (ref 0.0–5.0)
BKR WAM HEMATOCRIT (2 DEC): 35.7 % (ref 35.00–45.00)
BKR WAM HEMOGLOBIN: 11.2 g/dL — ABNORMAL LOW (ref 11.7–15.5)
BKR WAM IMMATURE GRANULOCYTES: 0.3 % (ref 0.0–1.0)
BKR WAM LYMPHOCYTES: 36.1 % (ref 17.0–50.0)
BKR WAM MCH (PG): 28.1 pg (ref 27.0–33.0)
BKR WAM MCHC: 31.4 g/dL (ref 31.0–36.0)
BKR WAM MCV: 89.7 fL (ref 80.0–100.0)
BKR WAM MONOCYTE ABSOLUTE COUNT.: 0.59 x 1000/ÂµL (ref 0.00–1.00)
BKR WAM MONOCYTES: 9.5 % (ref 4.0–12.0)
BKR WAM MPV: 10.2 fL (ref 8.0–12.0)
BKR WAM NEUTROPHILS: 47 % (ref 39.0–72.0)
BKR WAM NUCLEATED RED BLOOD CELLS: 0 % (ref 0.0–1.0)
BKR WAM PLATELETS: 296 x1000/ÂµL (ref 150–420)
BKR WAM RDW-CV: 17.6 % — ABNORMAL HIGH (ref 11.0–15.0)
BKR WAM RED BLOOD CELL COUNT.: 3.98 M/ÂµL — ABNORMAL LOW (ref 4.00–6.00)
BKR WAM WHITE BLOOD CELL COUNT: 6.2 x1000/ÂµL (ref 4.0–11.0)

## 2022-07-16 LAB — C-REACTIVE PROTEIN     (CRP): BKR C-REACTIVE PROTEIN, HIGH SENSITIVITY: 8.6 mg/L — ABNORMAL HIGH

## 2022-07-16 LAB — SEDIMENTATION RATE (ESR): BKR SEDIMENTATION RATE, ERYTHROCYTE: 49 mm/h — ABNORMAL HIGH (ref 0–20)

## 2022-07-16 LAB — PHOSPHORUS     (BH GH L LMW YH): BKR PHOSPHORUS: 3.3 mg/dL (ref 2.2–4.5)

## 2022-07-16 LAB — MAGNESIUM: BKR MAGNESIUM: 1.8 mg/dL (ref 1.7–2.4)

## 2022-07-16 MED ORDER — ACETAMINOPHEN 325 MG TABLET
325 mg | Freq: Once | ORAL | Status: CP
Start: 2022-07-16 — End: ?
  Administered 2022-07-16: 20:00:00 325 mg via ORAL

## 2022-07-16 NOTE — Progress Notes
[facility]	 [facility]	Medicine Progress NoteAttending Provider: Haskell Riling, MDDate of Admission: 2/21/2023Patient Summary: Danielle [name] is a 66 yo conserved woman w/ PMH RA, multiple abdominal surgeries, chronic hypercalcemia with prolonged hospital stay at OSH and subsequent transfer to [facility] on 02/11/22 for encephalopathy and FTT, now resolved, but remains inpatient pending placement/T19 process. Interim Events: Yesterday: - No clinical change- Undergoing T19/Medicaid processOvernight: - NAEOToday: - No new concerns, unchanged L ankle discomfortMeds: Scheduled Meds:Current Facility-Administered Medications Medication Dose Route Frequency Provider Last Rate Last Admin ? diclofenac (VOLTAREN) 1 % gel 4 g  4 g Topical (Top) 4x Daily Larna Daughters, MD   4 g at 07/15/22 2043 ? enoxaparin (LOVENOX) injection 80 mg  80 mg Subcutaneous Q12H Abid, Bushra, MD   80 mg at 07/15/22 2042 ? famotidine (PEPCID) tablet 20 mg  20 mg Oral Daily Senaida Lange, MD   20 mg at 07/15/22 6578 ? ferrous gluconate (FERGON) tablet 324 mg  324 mg Oral Every Other Day Larna Daughters, MD   324 mg at 07/15/22 4696 ? gabapentin (NEURONTIN) capsule 100 mg  100 mg Oral TID Marlon Pel, MD   100 mg at 07/15/22 2043 ? lidocaine 4 % topical patch 1 patch  1 patch Transdermal Q24H Babette Relic, MD   1 patch at 07/15/22 1524 ? melatonin tablet 3 mg  3 mg Oral Nightly Pryor Ochoa, MD   3 mg at 07/15/22 2043 ? nystatin (MYCOSTATIN) powder 1 Application  1 Application  Topical (Top) BID Henriette Combs, MD   1 Application  at 07/15/22 [phone] ? predniSONE (DELTASONE) tablet 5 mg  5 mg Oral Daily Jerral Ralph, MD   5 mg at 07/15/22 8413 ? rosuvastatin (CRESTOR) tablet 10 mg  10 mg Per G Tube Nightly Dorisann Frames, MD   10 mg at 07/15/22 2043 Continuous Infusions:PRN Meds:acetaminophen, dextrose (GLUCOSE) oral liquid 15 g **OR** fruit juice **OR** skim milk, dextrose (GLUCOSE) oral liquid 30 g **OR** fruit juice, dextrose injection, dextrose injection, traMADoLObjective: Vitals: Temp:  [97.8 ?F (36.6 ?C)] 97.8 ?F (36.6 ?C)Pulse:  [62] 62Resp:  [19] 19BP: (96)/(60) 96/60SpO2:  [99 %] 99 %Device (Oxygen Therapy): room air Weight: 75.1 kg I/O's: Intake/Output Summary (Last 24 hours) at 07/16/2022 0628Last data filed at 07/16/2022 0350Gross per 24 hour Intake -- Output 675 ml Net -675 ml  Physical Exam:General:  Laying in bed in no acute distressPulmonary: CTAB; normal respiratory effortCardiac: RRR, no M/R/GAbdomen: Soft, non-tender, non-distended, no guarding or rebound; +BSNeuro: Pleasant, calm, answering questions appropriatelyExtremities: Contracted left foot/ankle with mild swelling (stable), no erythema  Labs: MetabolicRecent Labs Lab 07/26/230340 NA 141 K 4.1 CL 109* CO2 22 BUN 13 CREATININE 0.35* ANIONGAP 10 CALCIUM 10.4* MG 1.8 PHOS 3.3  HematologicRecent Labs Lab 07/26/230340 WBC 6.2 HGB 11.2* HCT 35.70 PLT 296 NEUTROPHILS 47.0  HepaticRecent Labs Lab 07/26/230340 BILITOT <0.2 ALT 12 AST 17 ALKPHOS 59 PROT 7.0 ALBUMIN 3.3*  SpecialNo results for input(s): URICACID, LDH, FIBRINOGEN, DDIMER, FERRITIN, CRP in the last 168 hours.Invalid input(s): ESR Micro:No newDiagnostics:No newECG/Tele Events: No newAssessment & Plan: Danielle [name] is a 66 yo conserved woman w/ PMH RA, multiple abdominal surgeries, chronic hypercalcemia with prolonged hospital stay at OSH and subsequent transfer to [facility] on 02/11/22 for encephalopathy and FTT, now resolved, but remains inpatient pending placement/T19 process. #DispositionPending T-19 approval/placement. 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 Previously seen by Endocrine. ?2/2 to prolonged immobolity vs primary hyperparathyroidism. She is s/p Reclast x1 with Endocrine on 3/23.- Continue PT- Weekly labs- Will need to f/u with Endocrine regarding need for bisphosphonate therapy (outpatient)- Will likely need DEXA scan as an outpatient# Non-occlusive DVT - Despite being on Eliquis for previous DVT- Continue lovenox 80mg  BID?# Iron Deficiency- Continue ferrous gluconate 324 mg PO QOD ?# Chronic polyneuropathy- Continue gabapentin 100 mg TIDDIET: RegularPPX: LovenoxCODE STATUS: Full CodeDISPO:  LTC (pending T-19)Signed:Sherill Wegener, MDYale Internal MedicineReachable by MHBJuly 26, 2023

## 2022-07-17 NOTE — Progress Notes
[facility]	 [facility]	Medicine Progress NoteAttending Provider: Haskell Riling, MDDate of Admission: 2/21/2023Patient Summary: Danielle [name] is a 66 yo conserved woman w/ PMH RA, multiple abdominal surgeries, chronic hypercalcemia with prolonged hospital stay at OSH and subsequent transfer to [facility] on 02/11/22 for encephalopathy and FTT, now resolved, but remains inpatient pending placement/T19 process. Interim Events: Yesterday: - Undergoing T19/Medicaid process- Worsening L knee pain, knee x-ray showed advanced OA and trace effusion- Extra dose of Tylenol givenOvernight: - NAEOToday: - Reports L knee pain improvedMeds: Scheduled Meds:Current Facility-Administered Medications Medication Dose Route Frequency Provider Last Rate Last Admin ? diclofenac (VOLTAREN) 1 % gel 4 g  4 g Topical (Top) 4x Daily Larna Daughters, MD   4 g at 07/17/22 0849 ? enoxaparin (LOVENOX) injection 80 mg  80 mg Subcutaneous Q12H Abid, Bushra, MD   80 mg at 07/17/22 0841 ? famotidine (PEPCID) tablet 20 mg  20 mg Oral Daily Senaida Lange, MD   20 mg at 07/17/22 0840 ? ferrous gluconate (FERGON) tablet 324 mg  324 mg Oral Every Other Day Larna Daughters, MD   324 mg at 07/17/22 0840 ? gabapentin (NEURONTIN) capsule 100 mg  100 mg Oral TID Marlon Pel, MD   100 mg at 07/17/22 1452 ? lidocaine 4 % topical patch 1 patch  1 patch Transdermal Q24H Babette Relic, MD   1 patch at 07/17/22 1454 ? melatonin tablet 3 mg  3 mg Oral Nightly Pryor Ochoa, MD   3 mg at 07/16/22 2145 ? nystatin (MYCOSTATIN) powder 1 Application  1 Application  Topical (Top) BID Henriette Combs, MD   1 Application  at 07/17/22 [phone] ? predniSONE (DELTASONE) tablet 5 mg  5 mg Oral Daily Jerral Ralph, MD   5 mg at 07/17/22 0840 ? rosuvastatin (CRESTOR) tablet 10 mg  10 mg Per G Tube Nightly Dorisann Frames, MD   10 mg at 07/16/22 2145 Continuous Infusions:PRN Meds:acetaminophen, dextrose (GLUCOSE) oral liquid 15 g **OR** fruit juice **OR** skim milk, dextrose (GLUCOSE) oral liquid 30 g **OR** fruit juice, dextrose injection, dextrose injection, traMADoLObjective: Vitals: Temp:  [97.5 ?F (36.4 ?C)-98.5 ?F (36.9 ?C)] 97.5 ?F (36.4 ?C)Pulse:  [67-73] 73Resp:  [20] 20BP: (99-111)/(60-69) 111/69SpO2:  [96 %] 96 %Device (Oxygen Therapy): room air Weight: 75 kg I/O's: Intake/Output Summary (Last 24 hours) at 07/17/2022 1641Last data filed at 07/16/2022 2217Gross per 24 hour Intake -- Output 50 ml Net -50 ml  Physical Exam:General:  Laying in bed in no acute distressPulmonary: CTAB; normal respiratory effortCardiac: RRR, no M/R/GAbdomen: Soft, non-tender, non-distended, no guarding or rebound; +BSNeuro: Pleasant, calm, answering questions appropriatelyExtremities: Contracted left foot/ankle with mild swelling (stable), no erythema  Labs: MetabolicRecent Labs Lab 07/26/230340 NA 141 K 4.1 CL 109* CO2 22 BUN 13 CREATININE 0.35* ANIONGAP 10 CALCIUM 10.4* MG 1.8 PHOS 3.3  HematologicRecent Labs Lab 07/26/230340 WBC 6.2 HGB 11.2* HCT 35.70 PLT 296 NEUTROPHILS 47.0  HepaticRecent Labs Lab 07/26/230340 BILITOT <0.2 ALT 12 AST 17 ALKPHOS 59 PROT 7.0 ALBUMIN 3.3*  SpecialNo results for input(s): URICACID, LDH, FIBRINOGEN, DDIMER, FERRITIN, CRP in the last 168 hours.Invalid input(s): ESR Micro:No newDiagnostics:No newECG/Tele Events: No newAssessment & Plan: Danielle [name] is a 66 yo conserved woman w/ PMH RA, multiple abdominal surgeries, chronic hypercalcemia with prolonged hospital stay at OSH and subsequent transfer to [facility] on 02/11/22 for encephalopathy and FTT, now resolved, but remains inpatient pending placement/T19 process. #DispositionPending T-19 approval/placement. Her sister Danielle [name]) is her guardian.- Appreciate SW/CM assistance#Rheumatoid arthritis#Osteoarthritis#Immobility, contractures?Patient was previously on methotrexate for  RA. Currently on chronic prednisone. Consulted Rheumatology regarding management.- Rheumatology consult, appreciate recs- Continue PT - Voltaren gel, Tylenol, lidocaine patch- Prednisone 5mg  qd#HypercalcemiaStable Ca at 10.5 (Alb 3.6). Previously seen by Endocrine. ?2/2 to prolonged immobolity vs primary hyperparathyroidism. She is s/p Reclast x1 with Endocrine on 3/23.- Continue PT- Weekly labs- Will need to f/u with Endocrine regarding need for bisphosphonate therapy (outpatient)- Will likely need DEXA scan as an outpatient# Non-occlusive DVT - Despite being on Eliquis for previous DVT- Continue lovenox 80mg  BID?# Iron Deficiency- Continue ferrous gluconate 324 mg PO QOD ?# Chronic polyneuropathy- Continue gabapentin 100 mg TIDDIET: RegularPPX: LovenoxCODE STATUS: Full CodeDISPO:  LTC (pending T-19)Signed:[name], MDYale Internal MedicineReachable by MHBJuly 27, 2023

## 2022-07-17 NOTE — Plan of Care
Plan of Care Overview/ Patient Status      Pt A&Ox4, Forgetful to situation at times. VSS on RA. Denies SOB, CP, N/V/D. No pain while laying in bed.  Right side ileostomy +flatus and stool.  Appliance and bag changed @ 1600.  Pt incontinent of urine, Pure wick in place.  Old peg site, dressing is clean dry and intact. Good PO intake. BLE elevated with pillows/wedge. Pt resting in bed. Hourly rounding. T&Rq2h. Safety precautions maintained, bed alarm on, call bell in reach.Problem: Adult Inpatient Plan of CareGoal: Plan of Care ReviewOutcome: Interventions implemented as appropriateGoal: Patient-Specific Goal (Individualized)Outcome: Interventions implemented as appropriateGoal: Absence of Hospital-Acquired Illness or InjuryOutcome: Interventions implemented as appropriateGoal: Optimal Comfort and WellbeingOutcome: Interventions implemented as appropriateGoal: Readiness for Transition of CareOutcome: Interventions implemented as appropriate Problem: InfectionGoal: Absence of Infection Signs and SymptomsOutcome: Interventions implemented as appropriate Problem: Physical Therapy GoalsGoal: Physical Therapy GoalsDescription: PT GOALS - addressed 05/02/22.1. Patient will demonstrate active range of motion x 4 extremities in preparation for mobility (GOAL MET)2. Caregivers will be independent and safely implement a range of motion/positioning program to prevent loss of range of motion and skin integrity (ONGOING)3. Patient will perform bed mobility with maximal assist (GOAL MET)4. Patient will tolerate sitting edge of bed >5 minutes with minimal assist (GOAL MET)5. Patient will perform transfers with moderate assist of 2 using rolling walker (ONGOING)6. Pt will perform bed mobility w/ modAx1 (ONGOING)7. Pt will perform STS transfer w/ modAx2 for a standing tolerance of 1 minute. (NEW) Outcome: Interventions implemented as appropriate Problem: Occupational Therapy GoalsGoal: Occupational Therapy GoalsDescription: OT goals1. Pt will move to EOB for seated ADL mod I2. Pt will follow 3/4 simple one step motor commands IND3. Pt will sequence through set up ADL task INDOT re-eval, Initial goals not met, continue to progress towards initial goalsOutcome: Interventions implemented as appropriate Problem: Impaired Wound HealingGoal: Optimal Wound HealingOutcome: Interventions implemented as appropriate Problem: Fall Injury RiskGoal: Absence of Fall and Fall-Related InjuryOutcome: Interventions implemented as appropriate Problem: Skin Injury Risk IncreasedGoal: Skin Health and IntegrityOutcome: Interventions implemented as appropriate Problem: Mobility ImpairmentGoal: Optimal MobilityOutcome: Interventions implemented as appropriate Problem: Pain AcuteGoal: Acceptable Pain Control and Functional AbilityOutcome: Interventions implemented as appropriate Problem: Speech Language Pathology GoalsGoal: Dysphagia Goals, SLPDescription: Current goal: Patient will tolerate regular solids and thin liquids without adverse impact on respiratory status and while maintaining adequate oral nutrition.Prior goal (met): The patient will tolerate least restrictive diet without any decline in respiratory status.Prior goal (met): Pt will tolerate pureed diet with thin liquids w/o s/x of aspiration or adverse impact on respiratory status.Outcome: Interventions implemented as appropriate Problem: Self-Care DeficitGoal: Improved Ability to Complete Activities of Daily LivingOutcome: Interventions implemented as appropriate Problem: Fluid, Electrolyte and Nutrition Imbalance (Ileostomy)Goal: Fluid, Electrolyte and Nutrition BalanceOutcome: Interventions implemented as appropriate Problem: Infection (Ileostomy)Goal: Absence of Infection Signs and SymptomsOutcome: Interventions implemented as appropriate Problem: Confusion ChronicGoal: Optimal Cognitive FunctionOutcome: Interventions implemented as appropriate

## 2022-07-17 NOTE — Other
-  CONSULT  REQUEST  [facility] NOTE-Type of consult: [facility] -New Consult: [id number] [name] /Location: 9719/9719-B / Brief Clinical Question: History of rheumatoid arthritis, L knee pain; recommendations for evaluation and management/Callback Cell Phone: [phone] / Please confirm receipt of this message by texting back ?OK?-1 - Mobile Heartbeat message sent to Renie Ora at 12:20 PM. Received response at 1221.-Tijuana Scheidegger Dick7/27/202312:20 [facility] [phone]

## 2022-07-17 NOTE — Plan of Care
Plan of Care Overview/ Patient Status    1900-0700:  Pt A&Ox3. Forgetful to situation at times.?VSS on RA. C/o L ankle pain and R burning heel. No c/o SOB, CP, N/V/D.?Right side ileostomy +flatus and stool.??Pt incontinent of urine, Pure wick in place and changed this shift.?Old peg site,?dressing changed and is?clean dry and intact.?Good PO intake.?BLE elevated?with pillows/wedge.?Pt resting in bed. Hourly rounding.?T&Rq2h. Safety precautions maintained, bed alarm on, call bell in reach, will ctm and follow nursing poc.Problem: Adult Inpatient Plan of CareGoal: Plan of Care ReviewOutcome: Interventions implemented as appropriateFlowsheets (Taken 07/17/2022 0104)Progress: no changePlan of Care Reviewed With: patient

## 2022-07-18 LAB — CBC WITH AUTO DIFFERENTIAL
BKR BILIRUBIN TOTAL: 0.6 % (ref 0.0–1.4)
BKR WAM ABSOLUTE IMMATURE GRANULOCYTES.: 0.03 x 1000/ÂµL (ref 0.00–0.30)
BKR WAM ABSOLUTE LYMPHOCYTE COUNT.: 2.07 x 1000/ÂµL (ref 0.60–3.70)
BKR WAM ABSOLUTE NRBC (2 DEC): 0 x 1000/ÂµL (ref 0.00–1.00)
BKR WAM ANALYZER ANC: 3.45 x 1000/ÂµL (ref 2.00–7.60)
BKR WAM BASOPHIL ABSOLUTE COUNT.: 0.04 x 1000/ÂµL (ref 0.00–1.00)
BKR WAM BASOPHILS: 0.6 % (ref 0.0–1.4)
BKR WAM EOSINOPHIL ABSOLUTE COUNT.: 0.36 x 1000/ÂµL (ref 0.00–1.00)
BKR WAM EOSINOPHILS: 5.3 % — ABNORMAL HIGH (ref 0.0–5.0)
BKR WAM HEMATOCRIT (2 DEC): 34.6 % — ABNORMAL LOW (ref 35.00–45.00)
BKR WAM HEMOGLOBIN: 10.8 g/dL — ABNORMAL LOW (ref 11.7–15.5)
BKR WAM IMMATURE GRANULOCYTES: 0.4 % (ref 0.0–1.0)
BKR WAM LYMPHOCYTES: 30.8 % (ref 17.0–50.0)
BKR WAM MCH (PG): 28 pg (ref 27.0–33.0)
BKR WAM MCHC: 31.2 g/dL (ref 31.0–36.0)
BKR WAM MCV: 89.6 fL (ref 80.0–100.0)
BKR WAM MONOCYTE ABSOLUTE COUNT.: 0.78 x 1000/ÂµL (ref 0.00–1.00)
BKR WAM MONOCYTES: 11.6 % (ref 4.0–12.0)
BKR WAM MPV: 10.5 fL (ref 8.0–12.0)
BKR WAM NEUTROPHILS: 51.3 % (ref 39.0–72.0)
BKR WAM NUCLEATED RED BLOOD CELLS: 0 % (ref 0.0–1.0)
BKR WAM PLATELETS: 278 x1000/ÂµL (ref 150–420)
BKR WAM RDW-CV: 17.4 % — ABNORMAL HIGH (ref 11.0–15.0)
BKR WAM RED BLOOD CELL COUNT.: 3.86 M/ÂµL — ABNORMAL LOW (ref 4.00–6.00)
BKR WAM WHITE BLOOD CELL COUNT: 6.7 x1000/ÂµL (ref 4.0–11.0)

## 2022-07-18 LAB — COMPREHENSIVE METABOLIC PANEL
BKR A/G RATIO: 0.9 — ABNORMAL LOW (ref 1.0–2.2)
BKR ALANINE AMINOTRANSFERASE (ALT): 9 U/L — ABNORMAL LOW (ref 10–35)
BKR ALBUMIN: 3.5 g/dL — ABNORMAL LOW (ref 3.6–4.9)
BKR ALKALINE PHOSPHATASE: 65 U/L (ref 9–122)
BKR ANION GAP: 9 (ref 7–17)
BKR ASPARTATE AMINOTRANSFERASE (AST): 24 U/L (ref 10–35)
BKR AST/ALT RATIO: 2.7
BKR BLOOD UREA NITROGEN: 13 mg/dL (ref 8–23)
BKR BUN / CREAT RATIO: 31.7 — ABNORMAL HIGH (ref 8.0–23.0)
BKR CALCIUM: 10.3 mg/dL — ABNORMAL HIGH (ref 8.8–10.2)
BKR CHLORIDE: 108 mmol/L — ABNORMAL HIGH (ref 98–107)
BKR CO2: 22 mmol/L (ref 20–30)
BKR CREATININE: 0.41 mg/dL (ref 0.40–1.30)
BKR EGFR, CREATININE (CKD-EPI 2021): >60 mL/min/{1.73_m2} (ref >=60)
BKR GLOBULIN: 3.7 g/dL — ABNORMAL HIGH (ref 2.3–3.5)
BKR GLUCOSE: 103 mg/dL — ABNORMAL HIGH (ref 70–100)
BKR POTASSIUM: 4.1 mmol/L (ref 3.3–5.3)
BKR PROTEIN TOTAL: 7.2 g/dL (ref 6.6–8.7)
BKR SODIUM: 139 mmol/L (ref 136–144)

## 2022-07-18 LAB — PHOSPHORUS     (BH GH L LMW YH): BKR PHOSPHORUS: 2.8 mg/dL (ref 2.2–4.5)

## 2022-07-18 LAB — MAGNESIUM: BKR MAGNESIUM: 1.8 mg/dL (ref 1.7–2.4)

## 2022-07-18 MED ORDER — HYDROXYCHLOROQUINE 200 MG TABLET
200 mg | Freq: Every day | ORAL | Status: DC
Start: 2022-07-18 — End: 2022-08-15
  Administered 2022-07-18 – 2022-08-14 (×28): 200 mg via ORAL

## 2022-07-18 NOTE — Plan of Care
Plan of Care Overview/ Patient Status    0700-1900: No acute events, Pt A&Ox4, Forgetful to situation at times. VSS on RA. Denies SOB, CP, N/V/D. No pain while laying in bed.  Right side ileostomy +flatus and stool.   Pt incontinent of urine, Pure wick in place.  Old peg site, dressing is clean dry and intact. Good PO intake. BLE elevated with pillows/wedge. Pt resting in bed. Hourly rounding. T&Rq2h. Safety precautions maintained, bed alarm on, call bell in reach.

## 2022-07-18 NOTE — Plan of Care
Danielle [name]					Location: EP 97/9719-B66 y.o., female				Attending: Haskell Riling, MD	Admit Date: 02/11/2022			[id number] LOS: 157 days FOLLOW-UP NUTRITION ASSESSMENT Dietary Orders (From admission, onward)     Start     Ordered  07/11/22 1228  Nutrition Supplements  (Nutrition Supplement Panel)  Once      Question Answer Comment Adult Supplements: Ensure Clear Danielle [name] [facility])  Supplement Frequency: BID    07/11/22 1228  06/20/22 1045  Diet Regular  DIET EFFECTIVE NOW      Question:  Initiate Nutrition Management Protocol (Yes/No?)  Answer:  Yes - Initiate Protocol  06/20/22 1045    No Known AllergiesANTHROPOMETRICS:Height:?70Admit wt:?79.4?kg?(per SNF documentation)Current wt: 75 kg (bed, 7/27); BMI: 23.7 kg/m?Dosing weight:?73.5?kg?(based on wt taken 7/4)? Additional wt information:Ht confirmed?with pt. No wt hx since 2019 per EMR. Wts have fluctuated since admit, likely r/t scale use vs fluid shifts vs some true wt change. Current wt down 4.3 kg (7.4%) from admit wt x 5 months, not significant for timeframe. 1+ edema noted per RN flowsheets. Moderate muscle wasting visualized in clavicle and temple region. Moderate fat wasting visualized in buccal and orbital regions. Will continue utilize 73.5 kg as dosing wt d/t frequent fluctuations over admission. Will monitor trends as available.?  Wt: 06/24/22 73.5 kg 12/13/18 98.6 kg ?ESTIMATED NUTRITION REQUIREMENTS:Kcal/day:?1838???????????(25?kcal/kg)Protein/day:?74-88?????(1-1.2?gm/kg)Fluids/day:?1838?mL/d?(1 mL/kcal) ONLY as medical and fluid status allows and only per team discretion.Needs based on:?dosing wt (73.5 kg), maintenance, steroid?NUTRITION ASSESSMENT:?PT EMR reviewed for follow up assessment. Pt continues to tolerate PO intake w/ 100% meal intake documented per flowsheets. Labs and meds reviewed. Met w/ pt at bedside today. Reports 100% intake of 2-3 meals/day. No c/o n/v. Notes normal ostomy output during admission. Drinks 2 Ensure Clear/day consistently, sometimes 3. Overall, pt appears to be meeting nutrition needs at this time.?Cultural/Religious/Ethnic Needs:?NoFood Allergies: None per pt?NUTRITION DIAGNOSIS:Severe malnutrition r/t depletion aeb moderate muscle and fat depletion. -- improving, continues?INTERVENTIONS/RECOMMENDATIONS:?Meals and Snacks:-Continue?Regular diet- Encourage small frequent meals/snacks- Encourage high kcal/protein foodsGoal:- Pt will consume >/=1 protein rich food with each meal- met, will continue?Nutrition Supplement Therapy:- Ensure Clear Berry to BID per pt request?Goal:?Pt to consume 2 Ensure Clear daily?- met, continue ??Nutrition?Education:- Previously discussed food sources of protein- Previously discussed foods to help thicken ostomy output?Discharge Planning and Transfer of Nutrition Care:??Nutrition related discharge needs still being determined at this time, will continue to follow?MONITORING / EVALUATION:?Food/Nutrition-Related History:Food and Nutrient Intake?Anthropometric Measurements:Weight ?Biochemical Data, Medical Tests and Procedures:Electrolyte and renal profileGlucose/endocrine profile?Nutrition-Focus Physical FindingsOverall findings?Electronically signed by Danielle [name], RD, July 28, 2023MHB- [phone] Please note: Nutrition is a consult only service on weekends and holidays.  Please enter a consult in EPIC if assistance is needed on a weekend or holiday or via M[facility] [phone] to contact the covering RD.

## 2022-07-18 NOTE — Plan of Care
Plan of Care Overview/ Patient Status    1900-0700: A&Ox4. VSS on RA. Complaint of left knee and ankle pain, prn tramadol given with mild effect, scheduled voltaren cream applied as well. Continent of Bowel, ileostomy in place, draining brownish red output. Incontinent of urine,purewick in place, changed this shift, draining clear yellow urine, incontinence care provided as needed. Old peg site dressing C/D/I.  Heels offloaded, turned and repositioned q2hr. Assist x2 in bed. Safety maintained. Call bell in reach. Bed alarm on. Hourly rounding completed.

## 2022-07-18 NOTE — Progress Notes
[facility]	 [facility]	Medicine Progress NoteAttending Provider: Haskell [name], MDDate of Admission: 2/21/2023Patient Summary: Danielle [name] is a 66 yo conserved woman w/ PMH RA, multiple abdominal surgeries, chronic hypercalcemia with prolonged hospital stay at OSH and subsequent transfer to [facility] on 02/11/22 for encephalopathy and FTT, now resolved, but remains inpatient pending placement/T19 process. Interim Events: Yesterday: - Undergoing T19/Medicaid process- Rheumatology consult for RA and knee painOvernight: - NAEO Today: - No new concerns Meds: Scheduled Meds:Current Facility-Administered Medications Medication Dose Route Frequency Provider Last Rate Last Admin ? diclofenac (VOLTAREN) 1 % gel 4 g  4 g Topical (Top) 4x Daily Danielle Daughters, MD   4 g at 07/17/22 2057 ? enoxaparin (LOVENOX) injection 80 mg  80 mg Subcutaneous Q12H [name], Bushra, MD   80 mg at 07/17/22 2056 ? famotidine (PEPCID) tablet 20 mg  20 mg Oral Daily Danielle Lange, MD   20 mg at 07/17/22 0840 ? ferrous gluconate (FERGON) tablet 324 mg  324 mg Oral Every Other Day Danielle Daughters, MD   324 mg at 07/17/22 0840 ? gabapentin (NEURONTIN) capsule 100 mg  100 mg Oral TID Danielle Pel, MD   100 mg at 07/17/22 2056 ? lidocaine 4 % topical patch 1 patch  1 patch Transdermal Q24H Danielle Relic, MD   1 patch at 07/17/22 1454 ? melatonin tablet 3 mg  3 mg Oral Nightly Danielle Ochoa, MD   3 mg at 07/17/22 2056 ? nystatin (MYCOSTATIN) powder 1 Application  1 Application  Topical (Top) BID Danielle Combs, MD   1 Application  at 07/17/22 [phone] ? predniSONE (DELTASONE) tablet 5 mg  5 mg Oral Daily Danielle Ralph, MD   5 mg at 07/17/22 0840 ? rosuvastatin (CRESTOR) tablet 10 mg  10 mg Per G Tube Nightly Danielle Frames, MD   10 mg at 07/17/22 2056 Continuous Infusions:PRN Meds:acetaminophen, dextrose (GLUCOSE) oral liquid 15 g **OR** fruit juice **OR** skim milk, dextrose (GLUCOSE) oral liquid 30 g **OR** fruit juice, dextrose injection, dextrose injection, traMADoLObjective: Vitals: Temp:  [97.5 ?F (36.4 ?C)-98.5 ?F (36.9 ?C)] 97.5 ?F (36.4 ?C)Pulse:  [67-73] 73Resp:  [20] 20BP: (99-111)/(60-69) 111/69SpO2:  [96 %] 96 %Device (Oxygen Therapy): room air Weight: 75 kg I/O's: Intake/Output Summary (Last 24 hours) at 07/18/2022 0705Last data filed at 07/18/2022 0600Gross per 24 hour Intake -- Output 1100 ml Net -1100 ml  Physical Exam:General:  Laying in bed in no acute distressPulmonary: CTAB; normal respiratory effortCardiac: RRR, no M/R/GAbdomen: Soft, non-tender, non-distended, no guarding or rebound; +BSNeuro: Pleasant, calm, answering questions appropriatelyExtremities: Contracted left foot/ankle with mild swelling (stable), no erythema  Labs: MetabolicRecent Labs Lab 07/26/230340 07/28/230413 NA 141 139 K 4.1 4.1 CL 109* 108* CO2 22 22 BUN 13 13 CREATININE 0.35* 0.41 ANIONGAP 10 9 CALCIUM 10.4* 10.3* MG 1.8 1.8 PHOS 3.3 2.8  HematologicRecent Labs Lab 07/26/230340 07/28/230413 WBC 6.2 6.7 HGB 11.2* 10.8* HCT 35.70 34.60* PLT 296 278 NEUTROPHILS 47.0 51.3  HepaticRecent Labs Lab 07/26/230340 07/28/230413 BILITOT <0.2 <0.2 ALT 12 9* AST 17 24 ALKPHOS 59 65 PROT 7.0 7.2 ALBUMIN 3.3* 3.5*  SpecialNo results for input(s): URICACID, LDH, FIBRINOGEN, DDIMER, FERRITIN, CRP in the last 168 hours.Invalid input(s): ESR Assessment & Plan: Danielle [name] is a 66 yo conserved woman w/ PMH RA, multiple abdominal surgeries, chronic hypercalcemia with prolonged hospital stay at OSH and subsequent transfer to [facility] on 02/11/22 for encephalopathy and FTT, now resolved, but remains inpatient pending placement/T19 process. #DispositionPending T-19 approval/placement. Her sister Danielle [name]) is her guardian.- Appreciate  SW/CM assistance#Rheumatoid arthritis#Osteoarthritis#Likely secondary Sjogren's#Immobility, contractures?Patient was previously on methotrexate for RA. Currently on chronic prednisone.- Rheumatology consult, appreciate recs- Start hydroxychloroquine 200mg  qd- Prednisone 5mg  qd; plan to taper 1mg  reduction per week- Continue PT  - Voltaren gel, Tylenol, lidocaine patch- Possible outpatient methotrexate#HypercalcemiaStable Ca at 10.3 (Alb 3.5). Previously seen by Endocrine. ?2/2 to prolonged immobolity vs primary hyperparathyroidism. She is s/p Reclast x1 with Endocrine on 3/23.- Continue PT- Weekly labs- Will need to f/u with Endocrine regarding need for bisphosphonate therapy (outpatient)- Will likely need DEXA scan as an outpatient# Non-occlusive DVT - Despite being on Eliquis for previous DVT- Continue lovenox 80mg  BID?# Iron Deficiency- Continue ferrous gluconate 324 mg PO QOD ?# Chronic polyneuropathy- Continue gabapentin 100 mg TIDDIET: RegularPPX: LovenoxCODE STATUS: Full CodeDISPO:  LTC (pending T-19)Signed:[name], MDYale Internal MedicineReachable by MHBJuly 28, 2023

## 2022-07-18 NOTE — Consults
[place] [facility]YscRHEUMATOLOGY CONSULT SERVICEJuly 27, 2023History provided by: Patient.Reason for Consult: A consult was requested by: Haskell Riling, MD with the question of History of rheumatoid arthritis, L knee pain; recommendations for evaluation and management.HPI: Danielle [name] is a 66 y.o. 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  She denies any joint pain/swelling at the moment.  She does say that yesterday she had left knee pain with mild swelling (no redness/warmth) as well as b/l feet pain which seems to have resolved with pain medication.  During her hospital course, she does endorse occasional joint pains and swelling predominantly involving bilateral knees and ankles, occasionally bilateral hands, shoulders.  She denies any elbows, hips, cervical spine involvement.Rheumatology has been consulted twice during hospital stay (02/20/22 and 05/11/22).  She has been getting diclofenac gel, gabapentin 100 mg thrice a day, prednisone 5 mg a day (was started on taper when last seen by Rheumatology in May, decision made to continue prednisone 5 mg per day indefinitely and hold off on starting methotrexate at that time given acuity of illness/frailty).  Patient reports occasional morning stiffness which typically gets better towards the middle of the day.  She notes some pain in bilateral lower extremities specifically in bilateral knees on ambulation.  She denies any shortness of breath, chest pain, abdominal pain.Brief rheumatological history and hospital course:Per chart review, it appears the patient was diagnosed with rheumatoid arthritis in 1980 03/1984.  Patient herself says she was diagnosed in the mid 90s. Her outpatient rheumatologist is Dr. Zenovia Jordan in [facility]  She was maintained on chronic methotrexate 10 mg a day.  She has not been taking methotrexate since at least November of 2022 (when she was admitted to OSH in [place]). From prior rheum consult 11/17/21: Admitted to [facility] for large volume pneumoperitoneum 2/2 to diverticular perforation and distal sigmoid colon stricture requiring ex-lap, extended R hemicolectomy, and end ileostomy (11/17/21). Hospital course was complicated by persistent AMS, attributed to metabolic encephalopathy. The patient was noted to have an extensive work-up with abdominal [place], head [place], and brain MRI only revealing pericolic gutter and a peripancreatitic fluid collection treated with antibiotics. Lab work was notable for a folic acid deficiency, which was treated folate supplementation. Repeat abdominal [place] on 12/18/2021 noted an abscess, which was subsequently drained. Persistent fluid collections were noted on repeat  imaging in January 2023. MRI imaging of the brain, C-Spine, and Thoracic-Spine were repeated with no new findings noted. During admission at [facility], the patient was noted to have bilateral DVTs, now on eliquis. After prolonged family and GOC discussion, PEG placement was performed on 01/24/22. Without noted improvement of encephalopathy, the patient was discharged to SNF at [facility] on 02/05/22. The patient was then transferred to [facility] on 02/11/22 for LTACH.On patient to [facility] she was initially encephalopathic and had failure to thrive, however both of these have now resolved and patient has returned to her baseline.  She was to have  chronic hypercalcemia, for which endocrinology was consulted.  Thought to be in the setting of prolonged immobility was primary hyperparathyroidism, status post Reclast with plans for outpatient endocrine and DEXA follow-up.  Rheumatology was consulted in May for bilateral knee pain.  She was started on prednisone taper, decision was made to continue her on 5 mg prednisone per day indefinitely and hold off on starting methotrexate as patient was recovering from acute illness and was thought to be too frail to begin DMARD therapy at the time.Past Medical History:Danielle [name]   has no past medical history on file.Past Surgical History:Danielle [name]   has no past surgical history on file.Home Medications:Outpatient Medications Marked as Taking for the 02/11/22 encounter [facility] Encounter) Medication Sig Dispense Refill ? acetaminophen (TYLENOL) 160 mg/5 mL elixir 500 mg by Per G Tube route every 6 (six) hours as needed.   ? acetaminophen (TYLENOL) 500 mg tablet Give 2 tablets via PEG-tube every 6 hours as needed   ? apixaban (ELIQUIS) 5 mg tablet 1 tablet (5 mg total) by Per G Tube route every 12 (twelve) hours.   ? chlorhexidine gluconate (PERIDEX) 0.12 % solution Use as directed 15 mLs in the mouth or throat 2 (two) times daily.   ? Elemental iron 60 mg (300 mg Ferrous sulfate) / 5 mL oral liquid Give 3.7 mL by g-tube two times daily   ? folic acid (FOLVITE) 1 mg tablet 1 tablet (1 mg total) by Per G Tube route daily.   ? insulin lispro (ADMELOG, HUMALOG) 100 unit/mL vial Inject under the skin 3 (three) times daily before meals. Use per sliding scale.   ? niacin 50 mg Immediate Release tablet 2 tablets (100 mg total) by Per G Tube route at bedtime.   ? pantoprazole (PROTONIX) 40 mg GrPS oral suspension 1 packet (40 mg total) by Per G Tube route daily.   ? psyllium (METAMUCIL) packet Give 1 packet via g-tube two times daily   ? pyridoxine, vitamin B6, (VITAMIN B-6) 100 mg tablet 1 tablet (100 mg total) by Per G Tube route daily.   ? rosuvastatin (CRESTOR) 10 mg tablet 1 tablet (10 mg total) by Per G Tube route nightly.   ? therapeutic multivitamin liquid 15 mLs by Per G Tube route daily.   ? thiamine (VITAMIN B1) 100 mg tablet 1 tablet (100 mg total) by Per G Tube route 2 (two) times daily.   Inpatient Medications:ReviewedAllergies:Patient has no known allergies.Family History:Cydnee's family history is not on file.Social History:[place]  has no history on file for tobacco use, alcohol use, and drug use.Review of Systems:  14 point ROS conducted. Negative unless mentioned in HPI.Vitals:Temp:  [97.8 ?F (36.6 ?C)-98.5 ?F (36.9 ?C)] 98.5 ?F (36.9 ?C)Pulse:  [67-80] 67Resp:  [16-20] 20BP: (94-99)/(60) 99/60SpO2:  [96 %-98 %] 96 %Device (Oxygen Therapy): room air onPhysical Examination:General: alert and oriented; in no acute distress. HEENT: PERRL; no scleral icterus; no conjunctival injections; MMM; no oral lesions or exudates. Neck: supple; no lymphadenopathy; no thyromegaly.  Cardiac: normal rate; regular rhythm; no MRGs.Lungs: moving air well; lungs clear bilaterally.Back: no tenderness to palpation and normal ROM.Abdomen: Colostomy bag, vertical midline healing scar, bandage over PEG removal site noted.Otherwise Soft, non-tender, non-distended, no guarding or rebound; +BSExtremities: no lower extremity edema.Skin: warm and dry; no rashes or lesions. Neuro: Alert and oriented x4MSK: B/L hands with signs of chronic bony deformities of digits.  Significant ulnar deviation of bilateral digits as well as significant prominence of distal ulna.  No active synovitis/ synovial hypertrophy notedRLE:  Genu valgus deformity. Bony hypertrophy of knee.  Slightly increased warmth and minimal effusion appreciated. Mild swelling of ankle joint. No tenderness/ warmth/erythema/ restriction of ROM. MTP squeeze test negative.LLE:  Bony hypertrophy of knee. No discernible synovitis/restriction of ROMLabs: Recent Labs Lab 07/26/230340 WBC 6.2 HGB 11.2* HCT 35.70 PLT 296  Recent Labs Lab 07/26/230340 NEUTROPHILS 47.0  Recent Labs Lab 07/26/230340 NA 141 K 4.1 CL 109* CO2 22 BUN 13 CREATININE 0.35* GLU 92 ANIONGAP 10  Recent Labs Lab 07/26/230340 CALCIUM 10.4* MG 1.8 PHOS 3.3  Recent Labs Lab 07/26/230340 ALBUMIN 3.3* AST 17 ALT 12 ALKPHOS 59 BILITOT <0.2  No results for input(s): PTT, LABPROT, INR in the last 168 hours. Radiology:XR Knee Left AP Lateral and Obliques Final Result  XR Ankle Left AP Lateral and Oblique Final Result   No acute osseous abnormality.      [facility] Radiology Notify System:  Routine  Reported And Signed By: Ricci Barker, MD    [facility] Radiology and Biomedical Imaging   US Duplex Lower Extremity Venous Bilat Final Result    Nonocclusive acute deep venous thrombus is noted within the right popliteal vein and tibioperoneal trunk.  [place] Radiology Notify System Classification: [place] - Urgent without Follow-up  Reported And Signed By: Verl Bangs, MD    [facility] Radiology and Biomedical Imaging   XR Knee Bilateral AP and Lateral ([facility] Y[facility] Y[facility] LM) Final Result   Degenerative changes, as above.   [facility] Radiology Notify System:  Routine  Reported And Signed By: Ricci Barker, MD    [facility] Radiology and Biomedical Imaging   XR Foot Right AP Lateral and Oblique Final Result   No acute osseous abnormality.   Please note evaluation is markedly limited due to osseous demineralization .   [facility] Radiology Notify System:  Routine  Reported And Signed By: Ricci Barker, MD    [facility] Radiology and Biomedical Imaging   US Duplex Lower Extremity Venous Bilat Final Result    No evidence of deep venous thrombosis in bilateral femoral/popliteal veins.  Please note that the left upper profunda femoral vein and bilateral calf veins were not visualized. If there is continued clinical concern, re-evaluation can be performed after 5-7 days to evaluate for propagation of an unseen thrombus from the calf.  [place] Radiology Notify System Classification: Routine  Reported And Signed By: Verl Bangs, MD    [facility] Radiology and Biomedical Imaging   [place] Abdomen Pelvis w IV Contrast Final Result  1. No evidence of abscess and no other acute [place] findings in the abdomen pelvis. 2. Persistent and similar possibly slightly decreased peritoneal nodularity.  Report Initiated By:  Idalia Needle, MD  Reported And Signed By: Glo Herring, MD    [facility] Radiology and Biomedical Imaging   XR Chest PA or AP Final Result   Mild pulmonary interstitial edema. No focal airspace consolidation.  Reported And Signed By: Cori Razor, MD    [facility] Radiology and Biomedical Imaging   XR Abdomen AP Final Result  XR Abdomen AP Final Result  IR Gastrointestinal procedures Final Result Successful gastrostomy replacement using a MIC 24 French gastrostomy tube. The tube is ready for immediate use. The tube should be flushed twice daily with 15 cc Tap Water.  Report Initiated By:  Thana Farr, MD  Reported And Signed By: Brunetta Jeans, MD    [facility] Radiology and Biomedical Imaging   XR Chest PA or AP (Portable) Final Result    No consolidations to suggest pneumonia.  Reported And  Signed By: Leilani Merl, MD    [facility] Radiology and Biomedical Imaging   XR Foot Bilateral AP Lateral and Oblique Final Result  1. Diffuse degenerative changes without evidence of acute osseous injury. There is no evidence of bony erosion.  Reported And Signed By: Merdis Delay, MD    [facility] Radiology and Biomedical Imaging   MRI Brain w wo IV Contrast Final Result  1. 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 2. Redemonstration microvascular disease no new [place] restricted diffusion hemorrhage or midline shift. 3.  Motion limits assessment of the heterogeneous maxillary alveolus correlate with dental inspection Report Initiated By:  Neta Mends, MD  Reported And Signed By: Lynett Fish, MD    [facility] Radiology and Biomedical Imaging   MRI Total Spine w wo IV Contrast Final Result  1. Scoliotic deformity of the thoracolumbar spine. 2. 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 3. No obvious epidural abscess or paraspinal collection. No findings suspicious for discitis/osteomyelitis. Mild perifacet inflammatory changes identified at L4-5, likely degenerative. Mild increased T2 signal and enhancement within the posterior paraspinal muscles diffusely throughout the lumbar spine could be related to denervation or inflammation.  Examination is significantly degraded by patient motion and sagittal T2 signal changes within the spinal cord or abnormal intrathecal enhancement may not be apparent on this examination. Examination to be repeated with sedation if clinically indicated.  Report Initiated By:  Neta Mends, MD  Reported And Signed By: Aviva Kluver, MD    [facility] Radiology and Biomedical Imaging   XR Hips Bilateral AP and Lateral with AP Pelvis Final Result  US Abdomen Pelvis Vascular Study Complete ([facility] BH LM) Final Result    No evidence of portal vein or hepatic vein thrombosis.  Increased liver echogenicity, which is most commonly noted with hepatic steatosis. However, please note that more advanced forms of liver disease, including hepatic fibrosis/early cirrhosis may have a similar imaging appearance.   [place] Radiology Notify System Classification: Routine  Report Initiated By:  Kreg Shropshire, MD  Reported And Signed By: Verl Bangs, MD    [facility] Radiology and Biomedical Imaging   Hospitalist Procedure [facility]) Final Result  Hospitalist Procedure [facility]) Final Result  [place] wo IV Contrast Final Result Since the prior exam, there has been a significant increase in ventricular size and sylvian fissures. In the appropriate clinical setting normal pressure hydrocephalus can be considered.  Report Initiated By:  Consuela Mimes, MD  Reported And Signed By: Cherre Blanc, MD    [facility] Radiology and Biomedical Imaging   [place] Chest wo IV Contrast Final Result  Patchy groundglass opacities in bilateral lower lobes with 4 mm right lower lobe lung nodule, findings may represent infectious/inflammatory etiology in a proper clinical setting. Short-term follow-up can be obtained for resolution. Otherwise consider follow-up chest [place] in 12 months for stability.  Reported And Signed By: Antonieta Loveless, MD    [facility] Radiology and Biomedical Imaging   [place] Abdomen Pelvis w IV Contrast Final Result No collection in abdomen or pelvis.   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 Correlation with prior imaging is recommended.    Reported And Signed By: Madie Reno, MD    [facility] Radiology and Biomedical Imaging   CXR (portable) Final Result   No acute cardiothoracic abnormality.  Report Initiated By:  Tessie Eke, RRA  Reported And Signed By: Alberteen Sam, MD  [place] Radiology and Biomedical Imaging   [place] RESULT SCAN Final Result  [place] RESULT SCAN Final Result  Outside Interpretation [place] Abdomen and/or Pelvis Final Result  [place] RESULT SCAN Final Result  XRAY RESULT SCAN Final Result  Outside Interpretation MRI Neuro Final Result   Only motion degraded T1 images are available for review which significantly limits evaluation. Mild ventriculomegaly. No focal mass or intracranial hemorrhage is identified.    Report Initiated By:  Renato Gails, MD  Reported And Signed By: Vicki Mallet, MD    [facility] Radiology and Biomedical Imaging   MRI RESULT SCAN Final Result  Outside Interpretation MRI Neuro Final Result Multilevel degenerative changes as described above. Report Initiated By:  Renato Gails, MD  Reported And Signed By: Vicki Mallet, MD    [facility] Radiology and Biomedical Imaging   Outside Interpretation MRI Neuro Final Result   Apparent multiple foci of T2 hyperintensity in the thoracic cord throughout its length including at the level of the conus in the setting of motion artifact. It is unclear if these are artifactual or relate to lesions within the cord. Repeat T2 axial and T2 sagittal images of the thoracic spine are recommended.   Levoscoliotic curvature of the thoracic spine with no significant spinal canal stenosis or neural foraminal narrowing.   Report Initiated By:  Renato Gails, MD  Reported And Signed By: Vicki Mallet, MD    [facility] Radiology and Biomedical Imaging   MRI RESULT SCAN Final Result  [facility] MRI Cervical Spine Final Result  Outside Interpretation [place] Abdomen and/or Pelvis Final Result  [place] RESULT SCAN Final Result  Outside Interpretation [place] Abdomen and/or Pelvis Final Result  [place] RESULT SCAN Final Result  ED Procedural [place] Peripheral Venous Access    (Results Pending) Assessment and Plan: Ms. Lutman is a 66 y.o.  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  Additional autoimmune workup completed early on in hospital stay was significant for an ANA titer of 1-160 (speckled pattern), antismooth muscle antibody of 33, SS-A antibody of 186, elevated IgG levels (evaluated by heme found to have low concern for monoclonal gammopathy).Although patient's knee pain is likely multifactorial (Plain films read as severe OA, however picture could represent a combination of progressive RA in concert with OA), her history of occasional joint swelling despite being on chronic prednisone is concerning for smoldering disease activity. Exam today was consistent with mildly increased temperature as well as slight effusion in right knee joint. There is concern for progressive erosive RA although patient does not seem to be in an acute flare at the moment.Various management modalities were discussed with the patient at bedside, patient has an aversion to needles and does not want to try intra-articular steroid injection (she has received this in the past with good effect).  She has tried hydroxychloroquine in the past prior to being placed on methotrexate, she does  not remember the specific reasons why hydroxychloroquine was discontinued. Our goal at this time would be to slowly introduce DMARD in the hope to wean off her chronic steroids given her overall improvement in functional status.  .Recommendations:-Start hydroxychloroquine at low dose of 200 mg once a day. Discussed with patient that this medicine typically takes a few months to reach maximal efficacy.-Attempt to slowly taper of prednisone dosage could trial 1 mg reduction per week.-Would aim for uptitration of DMARD and possible initiation of methotrexate as an outpatient.-For history of positive SSA antibody, patient clinically does not seem to fulfill criteria for Sjogren's disease. Should follow up symptoms as an outpatient.- Pain management per primary team.Thank you for this consult. We will follow along.Patient was seen and discussed with the attending, Dr. Annye Rusk.Signed: Renie Ora, [id number], Rheumatology fellowAvailable on MHB

## 2022-07-19 LAB — CBC WITH AUTO DIFFERENTIAL
BKR WAM ABSOLUTE IMMATURE GRANULOCYTES.: 0.01 x 1000/ÂµL (ref 0.00–0.30)
BKR WAM ABSOLUTE LYMPHOCYTE COUNT.: 2.01 x 1000/ÂµL (ref 0.60–3.70)
BKR WAM ABSOLUTE NRBC (2 DEC): 0 x 1000/ÂµL (ref 0.00–1.00)
BKR WAM ANALYZER ANC: 2.38 x 1000/ÂµL (ref 2.00–7.60)
BKR WAM BASOPHIL ABSOLUTE COUNT.: 0.04 x 1000/ÂµL (ref 0.00–1.00)
BKR WAM BASOPHILS: 0.7 % (ref 0.0–1.4)
BKR WAM EOSINOPHIL ABSOLUTE COUNT.: 0.3 x 1000/ÂµL (ref 0.00–1.00)
BKR WAM EOSINOPHILS: 5.6 % — ABNORMAL HIGH (ref 0.0–5.0)
BKR WAM HEMATOCRIT (2 DEC): 35.9 % (ref 35.00–45.00)
BKR WAM HEMOGLOBIN: 11.1 g/dL — ABNORMAL LOW (ref 11.7–15.5)
BKR WAM IMMATURE GRANULOCYTES: 0.2 % (ref 0.0–1.0)
BKR WAM LYMPHOCYTES: 37.6 % (ref 17.0–50.0)
BKR WAM MCH (PG): 28.3 pg (ref 27.0–33.0)
BKR WAM MCHC: 30.9 g/dL — ABNORMAL LOW (ref 31.0–36.0)
BKR WAM MCV: 91.6 fL (ref 80.0–100.0)
BKR WAM MONOCYTE ABSOLUTE COUNT.: 0.6 x 1000/ÂµL (ref 0.00–1.00)
BKR WAM MONOCYTES: 11.2 % (ref 4.0–12.0)
BKR WAM MPV: 10.3 fL (ref 8.0–12.0)
BKR WAM NEUTROPHILS: 44.7 % (ref 39.0–72.0)
BKR WAM NUCLEATED RED BLOOD CELLS: 0 % (ref 0.0–1.0)
BKR WAM PLATELETS: 268 x1000/ÂµL (ref 150–420)
BKR WAM RDW-CV: 17.3 % — ABNORMAL HIGH (ref 11.0–15.0)
BKR WAM RED BLOOD CELL COUNT.: 3.92 M/ÂµL — ABNORMAL LOW (ref 4.00–6.00)
BKR WAM WHITE BLOOD CELL COUNT: 5.3 x1000/ÂµL (ref 4.0–11.0)

## 2022-07-19 LAB — COMPREHENSIVE METABOLIC PANEL
BKR A/G RATIO: 1.1 (ref 1.0–2.2)
BKR ALANINE AMINOTRANSFERASE (ALT): 12 U/L (ref 10–35)
BKR ALBUMIN: 3.7 g/dL (ref 3.6–4.9)
BKR ALKALINE PHOSPHATASE: 59 U/L (ref 9–122)
BKR ANION GAP: 8 (ref 7–17)
BKR ASPARTATE AMINOTRANSFERASE (AST): 12 U/L (ref 10–35)
BKR AST/ALT RATIO: 1
BKR BILIRUBIN TOTAL: <0.2 mg/dL (ref <=1.2)
BKR BLOOD UREA NITROGEN: 14 mg/dL (ref 8–23)
BKR BUN / CREAT RATIO: 41.2 — ABNORMAL HIGH (ref 8.0–23.0)
BKR CALCIUM: 10.5 mg/dL — ABNORMAL HIGH (ref 8.8–10.2)
BKR CHLORIDE: 108 mmol/L — ABNORMAL HIGH (ref 98–107)
BKR CO2: 24 mmol/L (ref 20–30)
BKR CREATININE: 0.34 mg/dL — ABNORMAL LOW (ref 0.40–1.30)
BKR EGFR, CREATININE (CKD-EPI 2021): >60 mL/min/{1.73_m2} (ref >=60)
BKR GLOBULIN: 3.4 g/dL (ref 2.3–3.5)
BKR GLUCOSE: 89 mg/dL (ref 70–100)
BKR POTASSIUM: 3.9 mmol/L (ref 3.3–5.3)
BKR PROTEIN TOTAL: 7.1 g/dL (ref 6.6–8.7)
BKR SODIUM: 140 mmol/L (ref 136–144)

## 2022-07-19 LAB — PHOSPHORUS     (BH GH L LMW YH): BKR PHOSPHORUS: 2.8 mg/dL (ref 2.2–4.5)

## 2022-07-19 LAB — MAGNESIUM: BKR MAGNESIUM: 1.9 mg/dL (ref 1.7–2.4)

## 2022-07-19 NOTE — Plan of Care
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 +UO, refused?Bilateral LE in podus boots, safety maintained.Problem: Adult Inpatient Plan of CareGoal: Plan of Care ReviewOutcome: Interventions implemented as appropriateGoal: Patient-Specific Goal (Individualized)Outcome: Interventions implemented as appropriateGoal: Absence of Hospital-Acquired Illness or InjuryOutcome: Interventions implemented as appropriateGoal: Optimal Comfort and WellbeingOutcome: Interventions implemented as appropriateGoal: Readiness for Transition of CareOutcome: Interventions implemented as appropriate Problem: InfectionGoal: Absence of Infection Signs and SymptomsOutcome: Interventions implemented as appropriate Problem: Occupational Therapy GoalsGoal: Occupational Therapy GoalsDescription: OT goals1. Pt will move to EOB for seated ADL mod I2. Pt will follow 3/4 simple one step motor commands IND3. Pt will sequence through set up ADL task INDOT re-eval, Initial goals not met, continue to progress towards initial goalsOutcome: Interventions implemented as appropriate Problem: Speech Language Pathology GoalsGoal: Dysphagia Goals, SLPDescription: Current goal: Patient will tolerate regular solids and thin liquids without adverse impact on respiratory status and while maintaining adequate oral nutrition.Prior goal (met): The patient will tolerate least restrictive diet without any decline in respiratory status.Prior goal (met): Pt will tolerate pureed diet with thin liquids w/o s/x of aspiration or adverse impact on respiratory status.Outcome: Interventions implemented as appropriate Problem: Impaired Wound HealingGoal: Optimal Wound HealingOutcome: Interventions implemented as appropriate Problem: Fall Injury RiskGoal: Absence of Fall and Fall-Related InjuryOutcome: Interventions implemented as appropriate Problem: Skin Injury Risk IncreasedGoal: Skin Health and IntegrityOutcome: Interventions implemented as appropriate Problem: Mobility ImpairmentGoal: Optimal MobilityOutcome: Interventions implemented as appropriate Problem: Pain AcuteGoal: Acceptable Pain Control and Functional AbilityOutcome: Interventions implemented as appropriate Problem: Self-Care DeficitGoal: Improved Ability to Complete Activities of Daily LivingOutcome: Interventions implemented as appropriate Problem: Fluid, Electrolyte and Nutrition Imbalance (Ileostomy)Goal: Fluid, Electrolyte and Nutrition BalanceOutcome: Interventions implemented as appropriate Problem: Infection (Ileostomy)Goal: Absence of Infection Signs and SymptomsOutcome: Interventions implemented as appropriate Problem: Confusion ChronicGoal: Optimal Cognitive FunctionOutcome: Interventions implemented as appropriate

## 2022-07-19 NOTE — Progress Notes
[facility]	Medicine Progress NoteAttending Provider: Haskell Riling, MDDate of Admission: 2/21/2023Interim Events: Overnight: - NAEO Today: - No new concerns, left knee improved today, able to bend it more easily and less painMeds: Scheduled Meds:Current Facility-Administered Medications Medication Dose Route Frequency Provider Last Rate Last Admin ? diclofenac (VOLTAREN) 1 % gel 4 g  4 g Topical (Top) 4x Daily Larna Daughters, MD   4 g at 07/19/22 0909 ? enoxaparin (LOVENOX) injection 80 mg  80 mg Subcutaneous Q12H Abid, Bushra, MD   80 mg at 07/19/22 0856 ? famotidine (PEPCID) tablet 20 mg  20 mg Oral Daily Senaida Lange, MD   20 mg at 07/19/22 0856 ? ferrous gluconate (FERGON) tablet 324 mg  324 mg Oral Every Other Day Larna Daughters, MD   324 mg at 07/19/22 0856 ? gabapentin (NEURONTIN) capsule 100 mg  100 mg Oral TID Marlon Pel, MD   100 mg at 07/19/22 0856 ? hydrOXYchloroQUINE (PLAQUENIL) tablet 200 mg  200 mg Oral Daily Babette Relic, MD   200 mg at 07/19/22 0857 ? lidocaine 4 % topical patch 1 patch  1 patch Transdermal Q24H Babette Relic, MD   1 patch at 07/18/22 1623 ? melatonin tablet 3 mg  3 mg Oral Nightly Pryor Ochoa, MD   3 mg at 07/17/22 2056 ? nystatin (MYCOSTATIN) powder 1 Application  1 Application  Topical (Top) BID Henriette Combs, MD   1 Application  at 07/17/22 [phone] ? predniSONE (DELTASONE) tablet 5 mg  5 mg Oral Daily Jerral Ralph, MD   5 mg at 07/19/22 0857 ? rosuvastatin (CRESTOR) tablet 10 mg  10 mg Per G Tube Nightly Dorisann Frames, MD   10 mg at 07/18/22 2234 Continuous Infusions:PRN Meds:acetaminophen, dextrose (GLUCOSE) oral liquid 15 g **OR** fruit juice **OR** skim milk, dextrose (GLUCOSE) oral liquid 30 g **OR** fruit juice, dextrose injection, dextrose injection, traMADoLObjective: Vitals: Temp:  [97.6 ?F (36.4 ?C)-97.7 ?F (36.5 ?C)] 97.6 ?F (36.4 ?C)Pulse:  [58-80] 58Resp:  [18-20] 18BP: (95-104)/(59-68) 95/59SpO2:  [97 %-99 %] 99 %Device (Oxygen Therapy): room air Weight: 75 kg I/O's: Intake/Output Summary (Last 24 hours) at 07/19/2022 1156Last data filed at 07/18/2022 1900Gross per 24 hour Intake -- Output 200 ml Net -200 ml  Physical Exam:General:  Laying in bed in no acute distressPulmonary: CTABCardiac: RRR, no M/R/G appreciatedAbdomen: Soft, non-tenderNeuro: Pleasant, calm, answering questions appropriatelyExtremities: Contracted left foot/ankle and left knee with ongoing mild swelling, no erythema Labs: MetabolicRecent Labs Lab 07/26/230340 07/28/230413 07/29/230552 NA 141 139 140 K 4.1 4.1 3.9 CL 109* 108* 108* CO2 22 22 24  BUN 13 13 14  CREATININE 0.35* 0.41 0.34* ANIONGAP 10 9 8  CALCIUM 10.4* 10.3* 10.5* MG 1.8 1.8 1.9 PHOS 3.3 2.8 2.8  HematologicRecent Labs Lab 07/26/230340 07/28/230413 07/29/230552 WBC 6.2 6.7 5.3 HGB 11.2* 10.8* 11.1* HCT 35.70 34.60* 35.90 PLT 296 278 268 NEUTROPHILS 47.0 51.3 44.7  HepaticRecent Labs Lab 07/26/230340 07/28/230413 07/29/230552 BILITOT <0.2 <0.2 <0.2 ALT 12 9* 12 AST 17 24 12  ALKPHOS 59 65 59 PROT 7.0 7.2 7.1 ALBUMIN 3.3* 3.5* 3.7  SpecialNo results for input(s): URICACID, LDH, FIBRINOGEN, DDIMER, FERRITIN, CRP in the last 168 hours.Invalid input(s): ESR Assessment & Plan: Ms. Salis is a 66 yo conserved woman w/ PMH RA, multiple abdominal surgeries, chronic hypercalcemia with prolonged hospital stay at OSH and subsequent transfer to [facility] on 02/11/22 for encephalopathy and FTT, now resolved, but remains inpatient pending placement/T19 process. #DispositionPending T-19 approval/placement. Her sister [name]) is her guardian.- Appreciate SW/CM assistance#Rheumatoid  arthritis#Osteoarthritis#Likely secondary Sjogren's#Immobility, contractures?Patient was previously on methotrexate for RA. Currently on chronic prednisone.- Rheumatology consult, appreciate recs- C/w hydroxychloroquine 200mg  qd- Prednisone 5mg  qd; plan to taper 1mg  reduction per week- Voltaren gel, Tylenol, lidocaine patch- Continue PT - Possible outpatient methotrexate#HypercalcemiaStable Ca at 10.3 (Alb 3.5). Previously seen by Endocrine. ?2/2 to prolonged immobolity vs primary hyperparathyroidism. She is s/p Reclast x1 with Endocrine on 3/23.- Continue PT- Weekly labs- Will need to f/u with Endocrine regarding need for bisphosphonate therapy (outpatient)- Will likely need DEXA scan as an outpatient# Non-occlusive DVT - Despite being on Eliquis for previous DVT- Continue lovenox 80mg  BID?# Iron Deficiency- Continue ferrous gluconate 324 mg PO QOD ?# Chronic polyneuropathy- Continue gabapentin 100 mg TIDDIET: RegularPPX: LovenoxCODE STATUS: Full CodeDISPO:  LTC (pending T-19)Signed:Phallon Haydu Meredith Mody, MDYale Internal MedicineReachable by MHBJuly 29, 2023

## 2022-07-19 NOTE — Plan of Care
Plan of Care Overview/ Patient Status    1900-0700: A/o x4, forgetful, VSS on room air. C/o b/l foot pain, prn tramadol, tyelnol, & gabapentin  given with positive effect. Denies SOB/chest pain. ostomy in place, CDI,  purewick in place, incontinence care provided as needed. heels offloaded, please see flowsheet for full skin assessment. Pills whole. Assist x 2 in bed. T & R Q2 hours. Hourly rounding completed, safety maintained. Call bell within reach

## 2022-07-20 NOTE — Progress Notes
[facility]	Medicine Progress NoteAttending Provider: Daryll Drown, MDDate of Admission: 2/21/2023Interim Events: Overnight: - NAEO Today: - No new concerns, reports feeling wellMeds: Scheduled Meds:Current Facility-Administered Medications Medication Dose Route Frequency Provider Last Rate Last Admin ? diclofenac (VOLTAREN) 1 % gel 4 g  4 g Topical (Top) 4x Daily Soleymanjahi, Saeed, MD   4 g at 07/20/22 1211 ? enoxaparin (LOVENOX) injection 80 mg  80 mg Subcutaneous Q12H Abid, Bushra, MD   80 mg at 07/20/22 0857 ? famotidine (PEPCID) tablet 20 mg  20 mg Oral Daily Senaida Lange, MD   20 mg at 07/20/22 0857 ? ferrous gluconate (FERGON) tablet 324 mg  324 mg Oral Every Other Day Larna Daughters, MD   324 mg at 07/19/22 0856 ? gabapentin (NEURONTIN) capsule 100 mg  100 mg Oral TID Marlon Pel, MD   100 mg at 07/20/22 0857 ? hydrOXYchloroQUINE (PLAQUENIL) tablet 200 mg  200 mg Oral Daily Babette Relic, MD   200 mg at 07/20/22 0857 ? lidocaine 4 % topical patch 1 patch  1 patch Transdermal Q24H Babette Relic, MD   1 patch at 07/19/22 1314 ? melatonin tablet 3 mg  3 mg Oral Nightly Pryor Ochoa, MD   3 mg at 07/19/22 2137 ? nystatin (MYCOSTATIN) powder 1 Application  1 Application  Topical (Top) BID Henriette Combs, MD   1 Application  at 07/19/22 2144 ? predniSONE (DELTASONE) tablet 5 mg  5 mg Oral Daily Jerral Ralph, MD   5 mg at 07/20/22 0857 ? rosuvastatin (CRESTOR) tablet 10 mg  10 mg Per G Tube Nightly Dorisann Frames, MD   10 mg at 07/19/22 2137 Continuous Infusions:PRN Meds:acetaminophen, dextrose (GLUCOSE) oral liquid 15 g **OR** fruit juice **OR** skim milk, dextrose (GLUCOSE) oral liquid 30 g **OR** fruit juice, dextrose injection, dextrose injection, traMADoLObjective: Vitals: Temp:  [97.8 ?F (36.6 ?C)-97.9 ?F (36.6 ?C)] 97.9 ?F (36.6 ?C)Pulse:  [60-62] 60Resp: [18-20] 20BP: (101-102)/(65-66) 102/66SpO2:  [97 %-98 %] 98 %Device (Oxygen Therapy): room air Weight: 73.3 kg I/O's: Intake/Output Summary (Last 24 hours) at 07/20/2022 1255Last data filed at 07/20/2022 1100Gross per 24 hour Intake 240 ml Output 700 ml Net -460 ml  Physical Exam:General:  NAD in bedPulmonary: CTAB, no W/R/RCardiac: RRR, no M/R/GAbdomen: Soft, NTNDNeuro: Pleasant, calm, answering questions appropriatelyExtremities: Contracted left foot/ankle and left knee with ongoing mild swelling, no erythema Labs: MetabolicRecent Labs Lab 07/26/230340 07/28/230413 07/29/230552 NA 141 139 140 K 4.1 4.1 3.9 CL 109* 108* 108* CO2 22 22 24  BUN 13 13 14  CREATININE 0.35* 0.41 0.34* ANIONGAP 10 9 8  CALCIUM 10.4* 10.3* 10.5* MG 1.8 1.8 1.9 PHOS 3.3 2.8 2.8  HematologicRecent Labs Lab 07/26/230340 07/28/230413 07/29/230552 WBC 6.2 6.7 5.3 HGB 11.2* 10.8* 11.1* HCT 35.70 34.60* 35.90 PLT 296 278 268 NEUTROPHILS 47.0 51.3 44.7  HepaticRecent Labs Lab 07/26/230340 07/28/230413 07/29/230552 BILITOT <0.2 <0.2 <0.2 ALT 12 9* 12 AST 17 24 12  ALKPHOS 59 65 59 PROT 7.0 7.2 7.1 ALBUMIN 3.3* 3.5* 3.7  SpecialNo results for input(s): URICACID, LDH, FIBRINOGEN, DDIMER, FERRITIN, CRP in the last 168 hours.Invalid input(s): ESR Assessment & Plan: Ms. Danial is a 66 yo conserved woman w/ PMH RA, multiple abdominal surgeries, chronic hypercalcemia with prolonged hospital stay at OSH and subsequent transfer to [facility] on 02/11/22 for encephalopathy and FTT, now resolved, but remains inpatient pending placement/T19 process. #DispositionPending T-19 approval/placement. Her sister [name]) is her guardian.- Appreciate SW/CM assistance#Rheumatoid arthritis#Osteoarthritis#Likely secondary Sjogren's#Immobility, contractures?Patient was previously on methotrexate for RA. Currently on  chronic prednisone.- Rheumatology consult, appreciate recs- C/w hydroxychloroquine 200mg  qd- Prednisone 5mg  qd; plan to taper 1mg  reduction per week- Voltaren gel, Tylenol, lidocaine patch- Continue PT - Possible outpatient methotrexate#HypercalcemiaStable Ca at 10.5 (Alb 3.7). Previously seen by Endocrine. ?2/2 to prolonged immobility vs primary hyperparathyroidism. She is s/p Reclast x1 with Endocrine on 3/23.- Continue PT- Weekly labs- Will need to f/u with Endocrine regarding need for bisphosphonate therapy (outpatient)- Will likely need DEXA scan as an outpatient# Non-occlusive DVT - Despite being on Eliquis for previous DVT- Continue lovenox 80mg  BID?# Iron Deficiency- Continue ferrous gluconate 324 mg PO QOD ?# Chronic polyneuropathy- Continue gabapentin 100 mg TIDDIET: RegularPPX: LovenoxCODE STATUS: Full CodeDISPO:  LTC (pending T-19)Signed:Zia Kanner, MDYale Internal MedicineReachable by MHBJuly 30, 2023

## 2022-07-20 NOTE — Plan of Care
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 +UO, refused?Bilateral LE in podus boots, safety maintained.Problem: Adult Inpatient Plan of CareGoal: Plan of Care ReviewOutcome: Interventions implemented as appropriateGoal: Patient-Specific Goal (Individualized)Outcome: Interventions implemented as appropriateGoal: Absence of Hospital-Acquired Illness or InjuryOutcome: Interventions implemented as appropriateGoal: Optimal Comfort and WellbeingOutcome: Interventions implemented as appropriateGoal: Readiness for Transition of CareOutcome: Interventions implemented as appropriate Problem: InfectionGoal: Absence of Infection Signs and SymptomsOutcome: Interventions implemented as appropriate Problem: Physical Therapy GoalsGoal: Physical Therapy GoalsDescription: PT GOALS - addressed 05/02/22.1. Patient will demonstrate active range of motion x 4 extremities in preparation for mobility (GOAL MET)2. Caregivers will be independent and safely implement a range of motion/positioning program to prevent loss of range of motion and skin integrity (ONGOING)3. Patient will perform bed mobility with maximal assist (GOAL MET)4. Patient will tolerate sitting edge of bed >5 minutes with minimal assist (GOAL MET)5. Patient will perform transfers with moderate assist of 2 using rolling walker (ONGOING)6. Pt will perform bed mobility w/ modAx1 (ONGOING)7. Pt will perform STS transfer w/ modAx2 for a standing tolerance of 1 minute. (NEW) Outcome: Interventions implemented as appropriate Problem: Occupational Therapy GoalsGoal: Occupational Therapy GoalsDescription: OT goals1. Pt will move to EOB for seated ADL mod I2. Pt will follow 3/4 simple one step motor commands IND3. Pt will sequence through set up ADL task INDOT re-eval, Initial goals not met, continue to progress towards initial goalsOutcome: Interventions implemented as appropriate Problem: Speech Language Pathology GoalsGoal: Dysphagia Goals, SLPDescription: Current goal: Patient will tolerate regular solids and thin liquids without adverse impact on respiratory status and while maintaining adequate oral nutrition.Prior goal (met): The patient will tolerate least restrictive diet without any decline in respiratory status.Prior goal (met): Pt will tolerate pureed diet with thin liquids w/o s/x of aspiration or adverse impact on respiratory status.Outcome: Interventions implemented as appropriate Problem: Impaired Wound HealingGoal: Optimal Wound HealingOutcome: Interventions implemented as appropriate Problem: Fall Injury RiskGoal: Absence of Fall and Fall-Related InjuryOutcome: Interventions implemented as appropriate Problem: Skin Injury Risk IncreasedGoal: Skin Health and IntegrityOutcome: Interventions implemented as appropriate Problem: Mobility ImpairmentGoal: Optimal MobilityOutcome: Interventions implemented as appropriate Problem: Pain AcuteGoal: Acceptable Pain Control and Functional AbilityOutcome: Interventions implemented as appropriate Problem: Self-Care DeficitGoal: Improved Ability to Complete Activities of Daily LivingOutcome: Interventions implemented as appropriate Problem: Fluid, Electrolyte and Nutrition Imbalance (Ileostomy)Goal: Fluid, Electrolyte and Nutrition BalanceOutcome: Interventions implemented as appropriate Problem: Infection (Ileostomy)Goal: Absence of Infection Signs and SymptomsOutcome: Interventions implemented as appropriate Problem: Confusion ChronicGoal: Optimal Cognitive FunctionOutcome: Interventions implemented as appropriate

## 2022-07-20 NOTE — Plan of Care
Plan of Care Overview/ Patient Status    1900-0700: A/o x4, forgetful, VSS on room air. C/o b/l foot pain, prn tramadol, tyelnol, & gabapentin  given with positive effect. Denies SOB/chest pain. ostomy in place, CDI,  purewick in place, incontinence care provided as needed. heels offloaded, please see flowsheet for full skin assessment. Pills whole. Assist x 2 in bed. T & R Q2 hours. Hourly rounding completed, safety maintained. Call bell within reach

## 2022-07-21 NOTE — Plan of Care
Inpatient Physical Therapy Progress Note IP Adult PT Eval/Treat - 07/21/22 1128    Date of Visit / Treatment  Date of Visit / Treatment 07/21/22   Note Type Progress Note   Progress Report Due 07/23/22   End Time 1128    General Information  Pertinent History Of Current Problem per chart - Danielle [name] is a 66 yo conserved woman w/ PMH RA, multiple abdominal surgeries, chronic hypercalcemia with prolonged hospital stay at OSH and subsequent transfer to [facility] on 02/11/22 for encephalopathy and FTT, now resolved, but remains inpatient pending placement/T19 process   Subjective We can try   Other Providers cotx wth OT to achieve highest level of mobility and patient safety   General Observations supine in bed on 9-7, RN okayed session, on RA   Precautions/Limitations Fall Precautions;Bed alarm;Chair alarm    Vital Signs and Orthostatic Vital Signs  Vital Signs Vital Signs Stable   Vital Signs Free text RA, NAD    Pain/Comfort  Pain Comment (Pre/Post Treatment Pain) pt c/o pain in LLE with ROM, not quantifying at this time, positioned for comfort at end of session    Patient Coping  Observed Emotional State accepting    Cognition  Cognition Comments alert, oriented to self and place, confused at times, follows simple motor commands with cues, anxious at times    Vision/ Hearing  Vision Assessment Results Glasses at all times    Range of Motion  Range of Motion Examination WFL except   Range of Motion Comments B knee extension limited 2/2 hamstring tightness; R ankle to neutral, L to neutral with PROM but not AROM. Team notified that patient would benefit from AFO for LLE to improve DF    Manual Muscle Testing  Manual Muscle Testing Comments +deconditioning    Musculoskeletal  LUE Muscle Strength Grading 3-->active movement against gravity   RUE Muscle Strength Grading 3-->active movement against gravity   LLE Muscle Strength Grading 2-->active movement with gravity eliminated   DF 1/5  RLE Muscle Strength Grading 2-->active movement with gravity eliminated    Muscle Tone  Muscle Tone Testing Results No muscle tone deficits noted    Coordination  Opposition WFL - Within Functional Limits    Sensory Assessment  Sensory Tests Results No sensory impairment noted    Skin Assessment  Skin Assessment See Nursing Documentation    Balance  Sitting Balance: Static  POOR+    Minimal assist to maintain static position with no Assistive Device   Sitting Balance: Dynamic  POOR     Moves through 1/4 to 1/2 ROM range with moderate assist to right self   Standing Balance: Static POOR      Moderate assist to maintain static position with no Assistive Device   Standing Balance: Dynamic  POOR-    Unable to move from midline voluntarily, maximal assist to right self   Balance Assist Device Hand held assist    Bed Mobility  Supine-to-Sit Independence/Assistance Level Moderate assist;Assist of 2   Supine-to-Sit Assist Device Hand held assist   Sit-to-Supine Independence/Assistance Level Moderate assist;Assist of 2   Sit-to-Supine Assist Device Hand held assist   Bed Mobility Comments retropulsive sitting EOB and required constant cues for anterior trunk lean; BUE/BLE therex completed sitting EOB    Sit-Stand Transfer Training  Sit-to-Stand Transfer Independence/Assistance Level Maximum assist;Assist of 2   Sit-to-Stand Transfer Assist Device Hand held assist   Stand-to-Sit Transfer Independence/Assistance Level Maximum assist;Assist of 2   Stand-to-Sit Transfer Assist Device Hand  held assist   Sit-Stand Transfer Comments unable to acheive full upright posture, B knee blocking, pad used under buttocks to assist with hip extension   Stand-Sit Transfer Comments poor eccentric control    Gait Training  Independence/Assistance Level  Unable to perform    Handoff Documentation  Handoff Patient in bed;Bed alarm;Patient instructed to call nursing for mobility;Discussed with nursing    Endurance  Endurance Comments fair    PT- AM-PAC - Basic Mobility Screen- How much help from another person do you currently need.....  Turning from your back to your side while in a a flat bed without using rails? 2 - A Lot - Requires a lot of help (maximum to moderate assistance). Can use assistive devices.   Moving from lying on your back to sitting on the side of a flat bed without using bed rails? 1 - Total - Requires total assistance or cannot do it at all.   Moving to and from a bed to a chair (including a wheelchair)? 1 - Total - Requires total assistance or cannot do it at all.   Standing up from a chair using your arms(e.g., wheelchair or bedside chair)? 1 - Total - Requires total assistance or cannot do it at all.   To walk in a hospital room? 1 - Total - Requires total assistance or cannot do it at all.   Climbing 3-5 steps with a railing? 1 - Total - Requires total assistance or cannot do it at all.   AMPAC Mobility Score 7   TARGET Highest Level of Mobility Mobility Level 2, Turn self in bed/bed activity/dependent transfer    ACTUAL Highest Level of Mobility Mobility Level 3, Sit on edge of bed    Therapeutic Exercise  Therapeutic Exercise Comments BUE/BLE AROM/AAROM/PROM completed sitting EOB and supine in bed; encouraged throughout the day    Therapeutic Functional Activity  Therapeutic Functional Activity Comments bed mobility, sitting balance and tolerance, sit to stand transfer, standing balance and tolerance, patient safety education, re-positioning for upright posture    Clinical Impression  Follow up Assessment 07/21/22: Patient seen for PT session. She tolerated session fairly and required Ax2 for all safe functional mobility. She would benefit from continued PT services while in house. Plan for d/c to Long term care with PT/OT services as appropriate. Patient/Family Stated Goals  Patient/Family Stated Goal(s) feel better    Frequency/Equipment Recommendations  PT Frequency 2x per week   What day of week is next treatment expected? Thursday   07/24/22  PT/PTA completing this assessment AC    PT Recommendations for Inpatient Admission  Activity/Level of Assist dangle edge of bed;assist of 2;with hand held assist;out of bed;mechanical lift   ADL Recommendations assist of 2   Positioning chair position of the bed;reposition frequently;elevate heels   Therapeutic Exercise ROM as tolerated;encourage exercise program issued    Planned Treatment / Interventions  Plan for Next Visit progress as tolerated   Education Treatment / Interventions Patient Education / Training    PT Discharge Summary  Physical Therapy Disposition Recommendation SNF for long term placement     Betsey Holiday PT, DPT MHB: [phone]

## 2022-07-21 NOTE — Progress Notes
[facility]	Medicine Progress NoteAttending Provider: Hulda Marin, MDDate of Admission: 2/21/2023Interim Events: Overnight: - NAEO Today: - No new concerns this AMMeds: Scheduled Meds:Current Facility-Administered Medications Medication Dose Route Frequency Provider Last Rate Last Admin ? diclofenac (VOLTAREN) 1 % gel 4 g  4 g Topical (Top) 4x Daily Soleymanjahi, Saeed, MD   4 g at 07/21/22 1319 ? enoxaparin (LOVENOX) injection 80 mg  80 mg Subcutaneous Q12H Abid, Bushra, MD   80 mg at 07/21/22 0939 ? famotidine (PEPCID) tablet 20 mg  20 mg Oral Daily Senaida Lange, MD   20 mg at 07/21/22 0981 ? ferrous gluconate (FERGON) tablet 324 mg  324 mg Oral Every Other Day Larna Daughters, MD   324 mg at 07/21/22 1914 ? gabapentin (NEURONTIN) capsule 100 mg  100 mg Oral TID Marlon Pel, MD   100 mg at 07/21/22 0940 ? hydrOXYchloroQUINE (PLAQUENIL) tablet 200 mg  200 mg Oral Daily Babette Relic, MD   200 mg at 07/21/22 7829 ? lidocaine 4 % topical patch 1 patch  1 patch Transdermal Q24H Babette Relic, MD   1 patch at 07/20/22 1544 ? melatonin tablet 3 mg  3 mg Oral Nightly Pryor Ochoa, MD   3 mg at 07/20/22 2214 ? nystatin (MYCOSTATIN) powder 1 Application  1 Application  Topical (Top) BID Henriette Combs, MD   1 Application  at 07/21/22 0940 ? predniSONE (DELTASONE) tablet 5 mg  5 mg Oral Daily Jerral Ralph, MD   5 mg at 07/21/22 0940 ? rosuvastatin (CRESTOR) tablet 10 mg  10 mg Per G Tube Nightly Dorisann Frames, MD   10 mg at 07/20/22 2214 Continuous Infusions:PRN Meds:acetaminophen, dextrose (GLUCOSE) oral liquid 15 g **OR** fruit juice **OR** skim milk, dextrose (GLUCOSE) oral liquid 30 g **OR** fruit juice, dextrose injection, dextrose injection, traMADoLObjective: Vitals: Temp:  [97.4 ?F (36.3 ?C)] 97.4 ?F (36.3 ?C)Pulse:  [65] 65Resp:  [16-20] 20BP: (115)/(68) 115/68SpO2: [99 %] 99 %Device (Oxygen Therapy): room air Weight: 73.4 kg I/O's: Intake/Output Summary (Last 24 hours) at 07/21/2022 1542Last data filed at 07/21/2022 0600Gross per 24 hour Intake -- Output 200 ml Net -200 ml  Physical Exam:General: Laying in bed in NADPulmonary: CTAB, no W/R/RCardiac: RRR, no M/R/GAbdomen: Soft, NT, ND, colostomy bag in placeNeuro: Pleasant, calm, answering questions appropriatelyExtremities: Contracted left foot/ankle with ongoing mild swelling, still no erythema Labs: MetabolicRecent Labs Lab 07/26/230340 07/28/230413 07/29/230552 NA 141 139 140 K 4.1 4.1 3.9 CL 109* 108* 108* CO2 22 22 24  BUN 13 13 14  CREATININE 0.35* 0.41 0.34* ANIONGAP 10 9 8  CALCIUM 10.4* 10.3* 10.5* MG 1.8 1.8 1.9 PHOS 3.3 2.8 2.8  HematologicRecent Labs Lab 07/26/230340 07/28/230413 07/29/230552 WBC 6.2 6.7 5.3 HGB 11.2* 10.8* 11.1* HCT 35.70 34.60* 35.90 PLT 296 278 268 NEUTROPHILS 47.0 51.3 44.7  HepaticRecent Labs Lab 07/26/230340 07/28/230413 07/29/230552 BILITOT <0.2 <0.2 <0.2 ALT 12 9* 12 AST 17 24 12  ALKPHOS 59 65 59 PROT 7.0 7.2 7.1 ALBUMIN 3.3* 3.5* 3.7  SpecialNo results for input(s): URICACID, LDH, FIBRINOGEN, DDIMER, FERRITIN, CRP in the last 168 hours.Invalid input(s): ESR Assessment & Plan: Ms. Steakley is a 66 yo conserved woman w/ PMH RA, multiple abdominal surgeries, chronic hypercalcemia with prolonged hospital stay at OSH and subsequent transfer to [facility] on 02/11/22 for encephalopathy and FTT, now resolved, but remains inpatient pending placement/T19 process. #DispositionPending T-19 approval/placement. Her sister [name]) is her guardian.- Appreciate SW/CM assistance#Rheumatoid arthritis#Osteoarthritis#Likely secondary Sjogren's#Immobility, contractures?- Rheumatology consulted, appreciate recs- C/w hydroxychloroquine 200mg  qd- Prednisone 5mg  qd,  plan to taper 1mg  reduction per week starting 8/4- Voltaren gel, Tylenol, lidocaine patch- Continue PT#HypercalcemiaStable Ca at 10.5 (Alb 3.7). Previously seen by Endocrine. ?2/2 to prolonged immobility vs primary hyperparathyroidism. She is s/p Reclast x1 with Endocrine on 3/23.- Continue PT- Weekly labs- Will need to f/u with Endocrine regarding need for bisphosphonate therapy (outpatient)- Will likely need DEXA scan as an outpatient# Non-occlusive DVT - Despite being on Eliquis for previous DVT- Continue lovenox 80mg  BID?# Iron Deficiency- Continue ferrous gluconate 324 mg PO QOD ?# Chronic polyneuropathy- Continue gabapentin 100 mg TIDDIET: RegularPPX: LovenoxCODE STATUS: Full CodeDISPO:  LTC (pending T-19)Signed:Shabrea Weldin Meredith Mody, MDYale Internal MedicineReachable by MHBJuly 31, 2023

## 2022-07-21 NOTE — Plan of Care
Plan of Care Overview/ Patient Status    [name]&)x4, forgetful at times. VSS complains of left lower leg and foot  pain. Tramadol given with good effect. Ostomy with moderate output. Incontinent of urine, care provided. PO intake good. T&P q 2hrs, heels off loaded with podus boot to left pillows on right. Safety maintained, bed alarm on, call bell in reach.

## 2022-07-21 NOTE — Plan of Care
Inpatient Occupational Therapy Re-EvaluationDefault Flowsheet Data (most recent)   IP Adult OT Eval/Treat - 07/21/22 1201    Date of Visit / Treatment  Date of Visit / Treatment 07/21/22   Note Type Re-Evaluation   Progress Report Due 08/15/22   End Time 1201    General Information  Pertinent History Of Current Problem per chart - [name] is a 66 yo conserved woman w/ PMH RA, multiple abdominal surgeries, chronic hypercalcemia with prolonged hospital stay at OSH and subsequent transfer to [facility] on 02/11/22 for encephalopathy and FTT, now resolved, but remains inpatient pending placement/T19 process   Other Providers cotx with PT to achieve highest level of function   General Observations in bed, 9-7, RA, purewick   Precautions/Limitations Fall Precautions;Bed alarm;Chair alarm    Prior Level of Functioning/Social History  Additional Comments Per the chart report: Pt was brought to [facility] by her family from [facility] [place]. Her sister, Danielle [name] has been appointed Acupuncturist (Hearing 05/01/22) and awaiting court decree. Plan is SNF LTC once medically cleared. PLOF is unclear as she has had a prolonged hospital course    Vital Signs and Orthostatic Vital Signs  Vital Signs Vital Signs Stable   Vital Signs Free text on RA, NAD    Pain/Comfort  Pain Comment (Pre/Post Treatment Pain) pt c/o pain in LLE with ROM, agreeable to activity as tolerated. Pain improved with ROM tasks. not quantifying at this time, positioned for comfort at end of session.    Cognition  Overall Cognitive Status Impaired   Orientation Level Oriented to person;Oriented to place   Level of Consciousness alert;confused   Following Commands Follows one step commands with increased time;Follows one step commands with repetition   Personal Safety / Judgment Fall risk;Decreased recognition / insight of own deficits   Short Term Memory Working impaired   Attention Alternating impairment;Difficulty dividing attention    Vision/ Hearing  Vision Assessment Results No vision deficits noted    Range of Motion  Range of Motion Examination WFL except   Range of Motion Comments B hamstring tightness L>R, R ankle to neutral, L ankle lacking 5 degrees to neutral, tight shoulder flexion    Musculoskeletal  LUE Muscle Strength Grading 3-->active movement against gravity   RUE Muscle Strength Grading 3-->active movement against gravity   LLE Muscle Strength Grading 2-->active movement with gravity eliminated   DF 1/5  RLE Muscle Strength Grading --   3-/5   Muscle Tone  Muscle Tone Comments grossly low tone    Coordination  Opposition WFL - Within Functional Limits    Sensory Assessment  Sensory Tests Results No sensory impairment noted    Skin Assessment  Skin Assessment See Nursing Documentation    Balance  Sitting Balance: Static  POOR+    Minimal assist to maintain static position with no Assistive Device   Sitting Balance: Dynamic  POOR     Moves through 1/4 to 1/2 ROM range with moderate assist to right self   Standing Balance: Static POOR      Moderate assist to maintain static position with no Assistive Device   Standing Balance: Dynamic  POOR-    Unable to move from midline voluntarily, maximal assist to right self   Balance Assist Device Hand held assist    Lower Body Dressing  Independence/Assistance Level Maximum assist   Clothing Items socks   Lower Body Dressing Comments Supine in bed, Pt assisting with lifting BLE for donning. Unable to reach to pull up socks.  AM-PAC - Daily Activity IP Short Form  Help needed from another person putting on/taking off regular lower body clothing 2 - A Lot   Help needed from another person for bathing (incl. washing, rinsing, drying) 2 - A Lot   Help needed from another person for toileting (incl. using toilet, bedpan, urinal) 1 - Unable   Help needed from another person putting on/taking off regular upper body clothing 2 - A Lot   Help needed from another person taking care of personal grooming such as brushing teeth 3 - A Little   Help needed from another person eating meals 4 - None   AM-PAC Daily Activity Raw Score (Total of rows above) 14    Bed Mobility  Supine-to-Sit Independence/Assistance Level Moderate assist;Assist of 2   Supine-to-Sit Assist Device Hand held assist   Sit-to-Supine Independence/Assistance Level Moderate assist;Assist of 2   Sit-to-Supine Assist Device Hand held assist    Sit-Stand Transfer Training  Sit-to-Stand Transfer Independence/Assistance Level Maximum assist;Assist of 2   Sit-to-Stand Transfer Assist Device Hand held assist   Stand-to-Sit Transfer Independence/Assistance Level Maximum assist;Assist of 2   Stand-to-Sit Transfer Assist Device Hand held assist   Sit-Stand Transfer Comments u/a to achieve full upright position    Handoff Documentation  Handoff Patient in bed;Bed alarm;Patient instructed to call nursing for mobility;Discussed with nursing   Handoff Comments modified chair position of the bed    Endurance  Endurance Comments sitting tolerance EOB 3-5 minutes    Therapeutic Exercise  Therapeutic Exercise Comments AAROM/AROM x4    Upper Extremity Exercises  Shoulder Flexion   Elbow Flexion;Extension   Hand / Finger ROM Flexion;Extension    Lower Extremity Exercises  Sitting Exercises Long arc quads;Ankle pumps   Supine Exercises Heelslides;Hip abduction;Hip adduction;Short arc quad;Ankle pumps    Trunk / Spine Exercises  Balance Training Seated reaching activities    Therapeutic Functional Activity  Therapeutic Functional Activity Comments cognitive stimulation/training tasks    Orthotic Fabrication/Training  Orthotics Fabrication/Training Comments Patient to benefit from podus boot to L ankle. Covering provider Babette Relic made aware to order through [facility] Clinical Impression  Follow up Assessment 7/31- Patient withi mpaired balance, weakness, decreased activity tolerance. Current plan is for DC to LTC.    Patient/Family Stated Goals  Patient/Family Stated Goal(s) get stronger    Frequency/Equipment Recommendations  OT Frequency 2x per week   What day of week is next treatment expected? Thursday   8/3  OT/OTA completing this assessment BA    OT Recommendations for Inpatient Admission  ADL Recommendations assist of 2   bed level  Positioning chair position of the bed    Planned Treatment / Interventions  Education Treatment / Interventions Patient Education / Chief of Staff / Training    OT Discharge Summary  Disposition Recommendation SNF for long term placement     Problem: Occupational Therapy GoalsGoal: Occupational Therapy GoalsDescription: OT goals1. Pt will move to EOB for seated ADL mod I2. Pt will follow 3/4 simple one step motor commands IND3. Pt will sequence through set up ADL task INDOT re-eval, Initial goals not met, continue to progress towards initial goalsRE-EVAL COMPLETED 7/31, GOALS NOT MET CARRYOVER FOR 8/25Outcome: Interventions implemented as appropriate

## 2022-07-22 NOTE — Plan of Care
Plan of Care Overview/ Patient Status    1900 - 0700 A&Ox4. Resting in bed overnight. VSS on RA. Continues to report LLE pain, scheduled analgesics and PRN 25mg  Tramadol continued. Ostomy intact. Purewick draining yellow urine. T&P'd as tolerated and encouraged to do so, pt often refuses, education provided. Appeared to rest comfortably overnight. Awaiting LTP. Hourly rounding performed and safety maintained. Please continue to follow POC. See flow sheets for complete assessment. Problem: Adult Inpatient Plan of CareGoal: Plan of Care ReviewOutcome: Interventions implemented as appropriateFlowsheets (Taken 07/20/2022 2339)Progress: no changePlan of Care Reviewed With: patient

## 2022-07-22 NOTE — Plan of Care
Plan of Care Overview/ Patient Status    [name]&Ox4, forgetful at times. Ostomy with moderate output. Incontinent of urine, care provided. PO intake good. T&P q 2hrs, heels off loaded with podus boot to left pillows on right. Safety maintained, bed alarm on, call bell in reach. Patient went to healing garden with staff this afternoon. Enjoyed the outing very much.

## 2022-07-22 NOTE — Plan of Care
Plan of Care Overview/ Patient Status    1900 - 0700 A&Ox4. Sat at EOB tonight w/assist x2 to brush teeth. Tolerated well and was very pleased w/herself, eager for more opportunities for increased mobilization. New podus boot removed at bedtime. PRN 25mg  tramadol given for LLE pain. Otherwise, no new issues or concerns this shift. Appeared to rest comfortably overnight. Awaiting LTP. Hourly rounding performed and safety maintained. Please continue to follow POC. See flow sheets for complete assessment. 4782 Reported severe LLE pain upon waking this AM. Scheduled gabapentin given early, PRN Tylenol also given.Problem: Adult Inpatient Plan of CareGoal: Plan of Care ReviewOutcome: Interventions implemented as appropriateFlowsheetsTaken 07/22/2022 0212Plan of Care Reviewed With: patientTaken 07/20/2022 2339Progress: no change

## 2022-07-22 NOTE — Other
A wound consult was placed regarding an old PEG site. Spoke to covering [employer] she had no concerns regarding PEG site. Site has a dry gauze in place and site has small amount of bloody drainage. Can continue with DSD over old PEG site. *Mandatory wound consults are for the following: Pressure injuries of stages 3, 4, DTPI, unstageable. All other wounds can be managed by clinical judgement of nursing and medical staff and/or other specialty teams. Please do not consult wound team if another surgical service is also consulted - defer to their team(s) for topical treatment recommendations*Icela Glymph RN, Wound TeamMHB# [phone]/1/20233:32 PM

## 2022-07-22 NOTE — Progress Notes
[facility]	Medicine Progress NoteAttending Provider: Hulda [name], MDDate of Admission: 2/21/2023Interim Events: Overnight: NAEOToday: - left ankle better compared to yesterday (w/ contracture at baseline, PODUS boot yesterday) - afebrile, no new pain- reaching out to ostomy care for recs on known chronic abdominal mucous fistulaMeds: Scheduled Meds:Current Facility-Administered Medications Medication Dose Route Frequency Provider Last Rate Last Admin ? diclofenac (VOLTAREN) 1 % gel 4 g  4 g Topical (Top) 4x Daily [name], Saeed, MD   4 g at 07/21/22 2230 ? enoxaparin (LOVENOX) injection 80 mg  80 mg Subcutaneous Q12H [name], Danielle Piedra, MD   80 mg at 07/21/22 2228 ? famotidine (PEPCID) tablet 20 mg  20 mg Oral Daily Danielle Lange, MD   20 mg at 07/21/22 1610 ? ferrous gluconate (FERGON) tablet 324 mg  324 mg Oral Every Other Day Danielle Daughters, MD   324 mg at 07/21/22 9604 ? gabapentin (NEURONTIN) capsule 100 mg  100 mg Oral TID Danielle Pel, MD   100 mg at 07/22/22 5409 ? hydrOXYchloroQUINE (PLAQUENIL) tablet 200 mg  200 mg Oral Daily Danielle Relic, MD   200 mg at 07/21/22 8119 ? lidocaine 4 % topical patch 1 patch  1 patch Transdermal Q24H Danielle Relic, MD   1 patch at 07/20/22 1544 ? melatonin tablet 3 mg  3 mg Oral Nightly Danielle Ochoa, MD   3 mg at 07/20/22 2214 ? nystatin (MYCOSTATIN) powder 1 Application  1 Application  Topical (Top) BID Danielle Combs, MD   1 Application  at 07/21/22 0940 ? predniSONE (DELTASONE) tablet 5 mg  5 mg Oral Daily Danielle Ralph, MD   5 mg at 07/21/22 0940 ? rosuvastatin (CRESTOR) tablet 10 mg  10 mg Per G Tube Nightly Danielle Frames, MD   10 mg at 07/21/22 2228 Continuous Infusions:PRN Meds:acetaminophen, dextrose (GLUCOSE) oral liquid 15 g **OR** fruit juice **OR** skim milk, dextrose (GLUCOSE) oral liquid 30 g **OR** fruit juice, dextrose injection, dextrose injection, traMADoLObjective: Vitals: Temp:  [97.4 ?F (36.3 ?C)] 97.4 ?F (36.3 ?C)Pulse:  [65] 65Resp:  [16-20] 16BP: (115)/(68) 115/68SpO2:  [99 %] 99 %Device (Oxygen Therapy): room air Weight: 73.4 kg I/O's: Intake/Output Summary (Last 24 hours) at 07/22/2022 0719Last data filed at 07/21/2022 2300Gross per 24 hour Intake -- Output 150 ml Net -150 ml  Physical Exam:General: Laying in bed in NADPulmonary: CTAB, no W/R/RCardiac: RRR, no M/R/GAbdomen: Soft, NT, ND, colostomy bag in place, gauze pads w/ minimal serosanguinous secretionsNeuro: Pleasant, calm, answering questions appropriatelyExtremities: Contracted left foot/ankle with ongoing mild swelling in PODUS boot, no erythema Labs: MetabolicRecent Labs Lab 07/26/230340 07/28/230413 07/29/230552 NA 141 139 140 K 4.1 4.1 3.9 CL 109* 108* 108* CO2 22 22 24  BUN 13 13 14  CREATININE 0.35* 0.41 0.34* ANIONGAP 10 9 8  CALCIUM 10.4* 10.3* 10.5* MG 1.8 1.8 1.9 PHOS 3.3 2.8 2.8  HematologicRecent Labs Lab 07/26/230340 07/28/230413 07/29/230552 WBC 6.2 6.7 5.3 HGB 11.2* 10.8* 11.1* HCT 35.70 34.60* 35.90 PLT 296 278 268 NEUTROPHILS 47.0 51.3 44.7  HepaticRecent Labs Lab 07/26/230340 07/28/230413 07/29/230552 BILITOT <0.2 <0.2 <0.2 ALT 12 9* 12 AST 17 24 12  ALKPHOS 59 65 59 PROT 7.0 7.2 7.1 ALBUMIN 3.3* 3.5* 3.7  SpecialNo results for input(s): URICACID, LDH, FIBRINOGEN, DDIMER, FERRITIN, CRP in the last 168 hours.Invalid input(s): ESR Assessment & Plan: Danielle [name] is a 66 yo conserved woman w/ PMH RA, multiple abdominal surgeries, chronic hypercalcemia with prolonged hospital stay at OSH and subsequent transfer to [facility] on 02/11/22 for encephalopathy and FTT, now resolved,  but remains inpatient pending placement/T19 process. #DispositionPending T-19 approval/placement. 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 Previously seen by Endocrine. ?2/2 to prolonged immobility vs primary hyperparathyroidism. 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

## 2022-07-22 NOTE — Other
An ostomy consult was placed regarding care of the mucus fistula. Mucus fistula is pink and moist covered with a DSD. Mucus fistula has had no output per covering  RN Meriam Sprague and patient. Continue applying a DSD over mucus fistula if there is little to no output. If there is an increase in output,  can apply a standard 1 piece colostomy pouch Lawson # [phone].Communicated to covering [employer].Kennis Carina RN, Ostomy RNMHB# [phone]/1/20233:40 PM

## 2022-07-23 NOTE — Plan of Care
Plan of Care Overview/ Patient Status    Medical Social Work Follow Up  [employer] Most Recent Value Admission Information  Document Type Progress Note Reason for Inability to Assess --  [Patient mental status is altered] (For Inpatient/ED Only) Prior psychosocial assessment has been documented within this hospitalization No (For Inpatient/ED Only) Prior psychosocial assessment has been documented within 30 days of this hospitalization No Reason for Current Social Work Heritage manager Medical Team Comment Transition Coordination Community Agency Comment [place] [address] Record Reviewed Yes Level of Care Inpatient Psychosocial issues requiring intervention Title 19 Psychosocial interventions 10 minutes spent on the phone with Jacquenette Shone (sister) [phone]. [name] informed that she received a letter from the state and she will be submitting the final proof of the spend down tomorrow. [name] informed that she would e-mail the letter to me. I then provided education regarding the [employer] on Aging program, [employer] to assist with transitioning Denys back into the community in the future. [name] informed that she was familiar with the program and has a friend that can assist her with starting the process. [name] appreciated my assistance and I thanked her for her time. [facility] [employer] Specific referrals to enhance community supports (include existing and new resources) None Handoff Required? No Next Steps/Plan (including hand-off): I will continue to remain involved for support as needed. Signature: Leim Fabry, LCSW Contact Information: [phone]

## 2022-07-23 NOTE — Plan of Care
Plan of Care Overview/ Patient Status    0700-1900Pt is aox4, vss BP 99/69  - Pulse 65  - Temp 97.3 ?F (36.3 ?C) (Oral)  - Resp 20  - Ht 5' 10 (1.778 m)  - Wt 75.1 kg  - SpO2 99%  - BMI 23.76 kg/m? On room air. Pain well controlled, pt encouraged to monitor pain. Takes pills whole with water, tolerating regular diet. Ostomy appliance changed, abdm dressings changed.Wore podus boot on L foot for ~8 hrs. Tolerated well. Bed locked in lowest position, bed alarm on, call bell within reach, pt calls appropriately for assistance.

## 2022-07-23 NOTE — Plan of Care
Plan of Care Overview/ Patient Status    1900 - 0700 A&Ox4. Resting in bed overnight, declined to sit at EOB this shift, reporting being too tired. VSS on RA. Denied pain at rest, but pain w/movement, scheduled analgesics given. No new issues or concerns this shift. Appeared to rest comfortably overnight. Continues to await T19. Hourly rounding performed and safety maintained. Please continue to follow POC. See flow sheets for complete assessment. Problem: Adult Inpatient Plan of CareGoal: Plan of Care ReviewOutcome: Interventions implemented as appropriateFlowsheetsTaken 07/23/2022 0039Plan of Care Reviewed With: patientTaken 07/20/2022 2339Progress: no change

## 2022-07-23 NOTE — Progress Notes
[facility]	Medicine Progress NoteAttending Provider: Hulda Marin, MDDate of Admission: 2/21/2023Interim Events: Overnight: NAEOToday: - afebrile, no new pain- pleasant but frustrated w/ long stay in hospital, ready for LTC facility Meds: Scheduled Meds:Current Facility-Administered Medications Medication Dose Route Frequency Provider Last Rate Last Admin ? diclofenac (VOLTAREN) 1 % gel 4 g  4 g Topical (Top) 4x Daily Larna Daughters, MD   4 g at 07/22/22 2118 ? enoxaparin (LOVENOX) injection 80 mg  80 mg Subcutaneous Q12H Abid, Bushra, MD   80 mg at 07/22/22 2116 ? famotidine (PEPCID) tablet 20 mg  20 mg Oral Daily Senaida Lange, MD   20 mg at 07/22/22 0853 ? ferrous gluconate (FERGON) tablet 324 mg  324 mg Oral Every Other Day Larna Daughters, MD   324 mg at 07/21/22 1610 ? gabapentin (NEURONTIN) capsule 100 mg  100 mg Oral TID Marlon Pel, MD   100 mg at 07/22/22 2117 ? hydrOXYchloroQUINE (PLAQUENIL) tablet 200 mg  200 mg Oral Daily Babette Relic, MD   200 mg at 07/22/22 9604 ? lidocaine 4 % topical patch 1 patch  1 patch Transdermal Q24H Babette Relic, MD   1 patch at 07/20/22 1544 ? melatonin tablet 3 mg  3 mg Oral Nightly Pryor Ochoa, MD   3 mg at 07/20/22 2214 ? nystatin (MYCOSTATIN) powder 1 Application  1 Application  Topical (Top) BID Henriette Combs, MD   1 Application  at 07/22/22 [phone] ? predniSONE (DELTASONE) tablet 5 mg  5 mg Oral Daily Jerral Ralph, MD   5 mg at 07/22/22 8119 ? rosuvastatin (CRESTOR) tablet 10 mg  10 mg Per G Tube Nightly Dorisann Frames, MD   10 mg at 07/22/22 2117 Continuous Infusions:PRN Meds:acetaminophen, dextrose (GLUCOSE) oral liquid 15 g **OR** fruit juice **OR** skim milk, dextrose (GLUCOSE) oral liquid 30 g **OR** fruit juice, dextrose injection, dextrose injection, traMADoLObjective: Vitals: Temp:  [97.5 ?F (36.4 ?C)-98.1 ?F (36.7 ?C)] 98.1 ?F (36.7 ?C)Pulse:  [60-65] 65Resp:  [18-20] 18BP: (97-130)/(62-75) 130/75SpO2:  [98 %-100 %] 98 %Device (Oxygen Therapy): room air Weight: 75.1 kg I/O's: Intake/Output Summary (Last 24 hours) at 07/23/2022 0644Last data filed at 07/23/2022 0500Gross per 24 hour Intake 720 ml Output 225 ml Net 495 ml  Physical Exam:General: Laying in bed in NADPulmonary: CTAB, no W/R/RCardiac: RRR, no M/R/GAbdomen: Soft, NT, ND, colostomy bag in place, gauze pads w/ minimal serosanguinous secretionsNeuro: Pleasant, calm, answering questions appropriatelyExtremities: Contracted left foot/ankle, PODUS boot not worn Labs: MetabolicRecent Labs Lab 07/28/230413 07/29/230552 NA 139 140 K 4.1 3.9 CL 108* 108* CO2 22 24 BUN 13 14 CREATININE 0.41 0.34* ANIONGAP 9 8 CALCIUM 10.3* 10.5* MG 1.8 1.9 PHOS 2.8 2.8  HematologicRecent Labs Lab 07/28/230413 07/29/230552 WBC 6.7 5.3 HGB 10.8* 11.1* HCT 34.60* 35.90 PLT 278 268 NEUTROPHILS 51.3 44.7  HepaticRecent Labs Lab 07/28/230413 07/29/230552 BILITOT <0.2 <0.2 ALT 9* 12 AST 24 12 ALKPHOS 65 59 PROT 7.2 7.1 ALBUMIN 3.5* 3.7  SpecialNo results for input(s): URICACID, LDH, FIBRINOGEN, DDIMER, FERRITIN, CRP in the last 168 hours.Invalid input(s): ESR Assessment & Plan: Ms. Kisel is a 66 yo conserved woman w/ PMH RA, multiple abdominal surgeries, chronic hypercalcemia with prolonged hospital stay at OSH and subsequent transfer to [facility] on 02/11/22 for encephalopathy and FTT, now resolved, but remains inpatient pending placement/T19 process. #DispositionPending T-19 approval/placement. Her sister [name]) is her guardian.- Appreciate SW/CM assistance#Rheumatoid arthritis#Osteoarthritis#Likely secondary Sjogren's#Immobility, contractures?- Rheumatology consulted, appreciate recs- C/w hydroxychloroquine 200mg  qd- Prednisone 5mg  qd, plan to taper 1mg  reduction  per week starting 8/4- Voltaren gel, Tylenol, lidocaine patch, PRN tramadol- Continue PT#HypercalcemiaStable Ca at 10.5 (7/29) Alb 3.7. Previously seen by Endocrine. ?2/2 to prolonged immobility vs primary hyperparathyroidism. 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

## 2022-07-24 MED ORDER — GABAPENTIN 300 MG CAPSULE
300 mg | Freq: Three times a day (TID) | ORAL | Status: DC
Start: 2022-07-24 — End: 2022-08-15
  Administered 2022-07-24 – 2022-08-14 (×64): 300 mg via ORAL

## 2022-07-24 NOTE — Plan of Care
Plan of Care Overview/ Patient Status     [name]&Ox4, forgetful at times. Ostomy with moderate output. Incontinent of urine, care provided. PO intake good. T&P q 2hrs, heels off loaded with podus boot to left pillows on right. Safety maintained, bed alarm on, call bell in reach.

## 2022-07-24 NOTE — Plan of Care
Inpatient Physical Therapy Re-Evaluation IP Adult PT Eval/Treat - 07/24/22 1150    Date of Visit / Treatment  Date of Visit / Treatment 07/24/22   Note Type Re-Evaluation   Progress Report Due 09/24/22   End Time 1150    General Information  Pertinent History Of Current Problem Per Chart: Danielle [name] is a 66 yo conserved woman w/ PMH RA, multiple abdominal surgeries, chronic hypercalcemia with prolonged hospital stay at OSH and subsequent transfer to [facility] on 02/11/22 for encephalopathy and FTT, now resolved, but remains inpatient pending placement/T19 process.   Subjective I gotta get Danielle [name] here!   General Observations Pt received supine in bed on EP9-7; on RA; NAD; ostomy; purwick; AFO bedside; RN cleared   Precautions/Limitations Fall Precautions;Bed alarm;Chair alarm   Precautions/Limitations Comment Aspiration Precaution    Weight Bearing Status  Weight Bearing Status Comments WBAT BLE    Prior Level of Functioning/Social History  Additional Comments Pt was brought to [facility] by her family from [facility]. Her sister, [name] has been appointed Acupuncturist (Hearing 05/01/22.) Plan is SNF LTC once medically cleared. PLOF is unclear as she has had a prolonged hospital course    Vital Signs and Orthostatic Vital Signs  Vital Signs Vital Signs Stable   Vital Signs Free text on RA, NAD    Pain/Comfort  Pain Comment (Pre/Post Treatment Pain) Pt c/o no pain at rest and 8/10 generalized L knee region pain w/ movement/mobilization; RN aware and repositioned for comfort.    Patient Coping  Observed Emotional State accepting;cooperative   Verbalized Emotional State acceptance;frustration    Cognition  Overall Cognitive Status WFL   Orientation Level Oriented to person;Oriented to place;Oriented to time   Level of Consciousness alert;confused   Following Commands Follows one step commands without difficulty   Personal Safety / Judgment Fall risk;Requires supervision with mobility   Cognition Comments Pleasant; can be forgetful and anxious at times; frustrated regarding incr length of stay in hospital, reassurance provided    Vision/ Hearing  Vision Assessment Results No vision deficits noted    Range of Motion  Range of Motion Examination WFL except   Range of Motion Comments Limited terminal hip/knee extension; developing B ankle PF contractures (L>R); limited shoulder elevation R>L)    Manual Muscle Testing  Manual Muscle Testing Comments + Deconditioned    Musculoskeletal  LUE Muscle Strength Grading 3-->active movement against gravity   RUE Muscle Strength Grading --   3-/5  LLE Muscle Strength Grading 2-->active movement with gravity eliminated   Ankle: 1/5  RLE Muscle Strength Grading --   3-/5   Muscle Tone  Muscle Tone Testing Results No muscle tone deficits noted    Coordination  Opposition WFL - Within Functional Limits   Fine Motor Coordination WFL   Coordination Comments GMC delayed    Sensory Assessment  Sensory Tests Results WFL except   Sensory Assessment Comments Hypersensitivity to LT BLE (L>R)    Skin Assessment  Skin Assessment See Nursing Documentation    Posture, Head/Trunk Alignment  Posture, Head/Trunk Alignment Retropulsive;Right lateral lean    Balance  Sitting Balance: Static  POOR+    Minimal assist to maintain static position with no Assistive Device   Sitting Balance: Dynamic  POOR     Moves through 1/4 to 1/2 ROM range with moderate assist to right self   Standing Balance: Static POOR-     Maximal assist to maintain static position with no Assistive Device   Standing Balance: Dynamic  POOR-  Unable to move from midline voluntarily, maximal assist to right self   Balance Assist Device Hand held assist   Balance Skills Training Comment Ax1    Bed Mobility  Supine-to-Sit Independence/Assistance Level Maximum assist;Assist of 1   Supine-to-Sit Assist Device Bed rails;Hand held assist;Head of bed elevated   Sit-to-Supine Independence/Assistance Level Maximum assist;Assist of 1   Sit-to-Supine Assist Device Hand held assist;Head of bed elevated   Bed Mobility, Impairments ROM decreased;strength decreased;postural control impaired;pain   Bed Mobility Comments Assist to trunk and BLE; noted R lateral lean and retropulsion requiring cues for correction; BLE and BUE therex EOB    Sit-Stand Transfer Training  Sit-Stand Transfer Comments Deferred d/t weakness, pain, and incr need for assist    Handoff Documentation  Handoff Patient in bed;Bed alarm;Patient instructed to call nursing for mobility;Discussed with nursing    Endurance  Endurance Comments Poor    PT- AM-PAC - Basic Mobility Screen- How much help from another person do you currently need.....  Turning from your back to your side while in a a flat bed without using rails? 2 - A Lot - Requires a lot of help (maximum to moderate assistance). Can use assistive devices.   Moving from lying on your back to sitting on the side of a flat bed without using bed rails? 1 - Total - Requires total assistance or cannot do it at all.   Moving to and from a bed to a chair (including a wheelchair)? 1 - Total - Requires total assistance or cannot do it at all.   Standing up from a chair using your arms(e.g., wheelchair or bedside chair)? 1 - Total - Requires total assistance or cannot do it at all.   To walk in a hospital room? 1 - Total - Requires total assistance or cannot do it at all.   Climbing 3-5 steps with a railing? 1 - Total - Requires total assistance or cannot do it at all.   AMPAC Mobility Score 7   TARGET Highest Level of Mobility Mobility Level 2, Turn self in bed/bed activity/dependent transfer    ACTUAL Highest Level of Mobility Mobility Level 3, Sit on edge of bed    Therapeutic Exercise  Therapeutic Exercise Comments Reviewed and encouraged BUE/BLE AROM; PROM RUE and BLE    Neuromuscular Re-education  Neuromuscular Re-education Comments Seated static/dynamic PC training    Therapeutic Functional Activity  Therapeutic Functional Activity Comments Bed mobility; Pt edu    Orthotic Fabrication/Training  Orthotics Fabrication/Training Comments L AFO    Clinical Impression  Follow up Assessment RE-EVAL (07/24/22): Pt seen for PT re-eval. Requires maxAx1 for supine<>sit. Noted R lateral lean and retropulsive EOB requiring cues to prevent LOB. Tolerated BUE/BLE therex well seated EOB. Will benefit from skilled services to improve gross functional ROM/strength, dynamic balance, and activity tolerance. Current PT d/c rec is SNF for LTC.   Criteria for Skilled Therapeutic Interventions Met yes    Patient/Family Stated Goals  Patient/Family Stated Goal(s) return to prior level of functioning;decrease pain;feel better    Frequency/Equipment Recommendations  PT Frequency 2x per week   What day of week is next treatment expected? Monday   8/7  PT/PTA completing this assessment RL   Equipment Needs During Admission/Treatment Hospital bed;Hoyer lift    PT Recommendations for Inpatient Admission  Activity/Level of Assist dangle edge of bed;assist of 2   ADL Recommendations assist of 2   Positioning chair position of the bed;reposition frequently;elevate heels   Therapeutic Exercise ROM as  tolerated   Other/Comments * Pillow between knees in supine for contracture management and prevention of skin breakdown; encourage B knee extension in supine; encourage B podus boots or shorten bed or foam to maintain 0 deg of ankle DF    Planned Treatment / Interventions  Plan for Next Visit Progress as tol   Training Treatment / Interventions Balance / Investment banker, operational;Endurance Training;Functional Personnel officer / Interventions Patient Education / Training    PT Discharge Summary Physical Therapy Disposition Recommendation SNF for long term placement     Problem: Physical Therapy GoalsGoal: Physical Therapy GoalsDescription: PT GOALS - addressed 07/25/22.1. Patient will demonstrate active range of motion x 4 extremities in preparation for mobility (GOAL MET)2. Caregivers will be independent and safely implement a range of motion/positioning program to prevent loss of range of motion and skin integrity (GOAL MET)3. Patient will perform bed mobility with maximal assist (GOAL MET)4. Patient will tolerate sitting edge of bed >5 minutes with minimal assist (GOAL MET)5. Patient will perform transfers with moderate assist of 2 using rolling walker (ONGOING)6. Pt will perform bed mobility w/ modAx1 (ONGOING)7. Pt will perform STS transfer w/ modAx2 for a standing tolerance of 1 minute. (ONGOING)8. Pt will perform 3x10 BLE LAQs steadily seated EOB (I) (NEW) Outcome: Revised Shanon Payor PT, DPTMHB: [phone]

## 2022-07-24 NOTE — Plan of Care
Plan of Care Overview/ Patient Status    1900-0700: A/o x4, forgetful, VSS on room air. C/o b/l foot pain, prn tramadol, tyelnol, & gabapentin  given with positive effect. Denies SOB/chest pain. ostomy in place, CDI,  purewick in place, incontinence care provided as needed. heels offloaded, please see flowsheet for full skin assessment. Pills whole. Assist x 2 in bed. T & R Q2 hours. Hourly rounding completed, safety maintained. Call bell within reach

## 2022-07-24 NOTE — Plan of Care
Plan of Care Overview/ Patient Status    Problem: Adult Inpatient Plan of CareGoal: Readiness for Transition of CareOutcome: Interventions implemented as appropriate Patient discussed in TCR.  Remains medically cleared for discharge with barrier of Title 19.  Per SW, pt's sister confirmed spend-down has been met and now we will wait for final approval for T19.  Care Manager will continue to follow with team.Danielle [name] Hinda Lenis, MSW/CMCare ManagementYale Legacy [facility] [phone]

## 2022-07-25 MED ORDER — PREDNISONE 1 MG TABLET
1 mg | Freq: Every day | ORAL | Status: CP
Start: 2022-07-25 — End: ?
  Administered 2022-07-26 – 2022-08-01 (×7): 1 mg via ORAL

## 2022-07-25 NOTE — Plan of Care
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 +UO,LLE boot in place, safety maintained.Problem: Adult Inpatient Plan of CareGoal: Plan of Care ReviewOutcome: Interventions implemented as appropriateGoal: Patient-Specific Goal (Individualized)Outcome: Interventions implemented as appropriateGoal: Absence of Hospital-Acquired Illness or InjuryOutcome: Interventions implemented as appropriateGoal: Optimal Comfort and WellbeingOutcome: Interventions implemented as appropriateGoal: Readiness for Transition of CareOutcome: Interventions implemented as appropriate Problem: Physical Therapy GoalsGoal: Physical Therapy GoalsDescription: PT GOALS - addressed 07/25/22.1. Patient will demonstrate active range of motion x 4 extremities in preparation for mobility (GOAL MET)2. Caregivers will be independent and safely implement a range of motion/positioning program to prevent loss of range of motion and skin integrity (GOAL MET)3. Patient will perform bed mobility with maximal assist (GOAL MET)4. Patient will tolerate sitting edge of bed >5 minutes with minimal assist (GOAL MET)5. Patient will perform transfers with moderate assist of 2 using rolling walker (ONGOING)6. Pt will perform bed mobility w/ modAx1 (ONGOING)7. Pt will perform STS transfer w/ modAx2 for a standing tolerance of 1 minute. (ONGOING)8. Pt will perform 3x10 BLE LAQs steadily seated EOB (I) (NEW) Outcome: Interventions implemented as appropriate Problem: Occupational Therapy GoalsGoal: Occupational Therapy GoalsDescription: OT goals1. Pt will move to EOB for seated ADL mod I2. Pt will follow 3/4 simple one step motor commands IND3. Pt will sequence through set up ADL task INDOT re-eval, Initial goals not met, continue to progress towards initial goalsRE-EVAL COMPLETED 7/31, GOALS NOT MET CARRYOVER FOR 8/25Outcome: Interventions implemented as appropriate Problem: Speech Language Pathology GoalsGoal: Dysphagia Goals, SLPDescription: Current goal: Patient will tolerate regular solids and thin liquids without adverse impact on respiratory status and while maintaining adequate oral nutrition.Prior goal (met): The patient will tolerate least restrictive diet without any decline in respiratory status.Prior goal (met): Pt will tolerate pureed diet with thin liquids w/o s/x of aspiration or adverse impact on respiratory status.Outcome: Interventions implemented as appropriate Problem: Impaired Wound HealingGoal: Optimal Wound HealingOutcome: Interventions implemented as appropriate Problem: Fall Injury RiskGoal: Absence of Fall and Fall-Related InjuryOutcome: Interventions implemented as appropriate Problem: Pain AcuteGoal: Acceptable Pain Control and Functional AbilityOutcome: Interventions implemented as appropriate Problem: Self-Care DeficitGoal: Improved Ability to Complete Activities of Daily LivingOutcome: Interventions implemented as appropriate Problem: Fluid, Electrolyte and Nutrition Imbalance (Ileostomy)Goal: Fluid, Electrolyte and Nutrition BalanceOutcome: Interventions implemented as appropriate Problem: Infection (Ileostomy)Goal: Absence of Infection Signs and SymptomsOutcome: Interventions implemented as appropriate

## 2022-07-25 NOTE — Progress Notes
[facility]	Medicine Progress NoteAttending Provider: Hulda Marin, MDDate of Admission: 2/21/2023Interim Events: Overnight: NAEOToday: - afebrile- continues to have pain in contracted left ankle overnight- pleased w/ new headphones - looking forward to PT today- would like bed bar to pull herself up- understands spend-down for LTC qualification has been a barrierMeds: Scheduled Meds:Current Facility-Administered Medications Medication Dose Route Frequency Provider Last Rate Last Admin ? diclofenac (VOLTAREN) 1 % gel 4 g  4 g Topical (Top) 4x Daily Larna Daughters, MD   4 g at 07/23/22 2139 ? enoxaparin (LOVENOX) injection 80 mg  80 mg Subcutaneous Q12H Abid, Bushra, MD   80 mg at 07/23/22 2131 ? famotidine (PEPCID) tablet 20 mg  20 mg Oral Daily Senaida Lange, MD   20 mg at 07/23/22 0981 ? ferrous gluconate (FERGON) tablet 324 mg  324 mg Oral Every Other Day Larna Daughters, MD   324 mg at 07/23/22 1914 ? gabapentin (NEURONTIN) capsule 100 mg  100 mg Oral TID Marlon Pel, MD   100 mg at 07/23/22 2131 ? hydrOXYchloroQUINE (PLAQUENIL) tablet 200 mg  200 mg Oral Daily Babette Relic, MD   200 mg at 07/23/22 7829 ? lidocaine 4 % topical patch 1 patch  1 patch Transdermal Q24H Babette Relic, MD   1 patch at 07/20/22 1544 ? melatonin tablet 3 mg  3 mg Oral Nightly Pryor Ochoa, MD   3 mg at 07/23/22 2131 ? nystatin (MYCOSTATIN) powder 1 Application  1 Application  Topical (Top) BID Henriette Combs, MD   1 Application  at 07/22/22 [phone] ? predniSONE (DELTASONE) tablet 5 mg  5 mg Oral Daily Jerral Ralph, MD   5 mg at 07/23/22 3086 ? rosuvastatin (CRESTOR) tablet 10 mg  10 mg Per G Tube Nightly Dorisann Frames, MD   10 mg at 07/23/22 2131 Continuous Infusions:PRN Meds:acetaminophen, dextrose (GLUCOSE) oral liquid 15 g **OR** fruit juice **OR** skim milk, dextrose (GLUCOSE) oral liquid 30 g **OR** fruit juice, dextrose injection, dextrose injection, traMADoLObjective: Vitals: Temp:  [97.3 ?F (36.3 ?C)] 97.3 ?F (36.3 ?C)Pulse:  [65] 65Resp:  [20] 20BP: (99)/(69) 99/69SpO2:  [99 %] 99 %Device (Oxygen Therapy): room air Weight: 75.1 kg I/O's: No intake or output data in the 24 hours ending 07/24/22 0648 Physical Exam:General: Laying in bed in NADPulmonary: CTAB, no W/R/RCardiac: RRR, no M/R/GAbdomen: Soft, NT, ND, colostomy bag in place, gauze pads dated 8/2 w/o drainageNeuro: Pleasant, calm, answering questions appropriatelyExtremities: Contracted left foot/ankle, PODUS boot not worn Labs: MetabolicRecent Labs Lab 07/28/230413 07/29/230552 NA 139 140 K 4.1 3.9 CL 108* 108* CO2 22 24 BUN 13 14 CREATININE 0.41 0.34* ANIONGAP 9 8 CALCIUM 10.3* 10.5* MG 1.8 1.9 PHOS 2.8 2.8  HematologicRecent Labs Lab 07/28/230413 07/29/230552 WBC 6.7 5.3 HGB 10.8* 11.1* HCT 34.60* 35.90 PLT 278 268 NEUTROPHILS 51.3 44.7  HepaticRecent Labs Lab 07/28/230413 07/29/230552 BILITOT <0.2 <0.2 ALT 9* 12 AST 24 12 ALKPHOS 65 59 PROT 7.2 7.1 ALBUMIN 3.5* 3.7  SpecialNo results for input(s): URICACID, LDH, FIBRINOGEN, DDIMER, FERRITIN, CRP in the last 168 hours.Invalid input(s): ESR Assessment & Plan: Danielle [name] is a 66 yo conserved woman w/ PMH RA, multiple abdominal surgeries, chronic hypercalcemia with prolonged hospital stay at OSH and subsequent transfer to [facility] on 02/11/22 for encephalopathy and FTT, now resolved, but remains inpatient pending placement/T19 process. #DispositionPending T-19 approval/placement. Her sister Danielle [name]) is her guardian. Spoke with sister yesterday, says that spend down is now complete and will qualify for LTC under Medicaid. 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 Previously seen by Endocrine. ?2/2 to prolonged immobility vs primary hyperparathyroidism. 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

## 2022-07-25 NOTE — Plan of Care
Danielle [name]					Location: EP [id number] y.o., female				Attending: Hulda Marin, MD	Admit Date: 02/11/2022			[name] LOS: 164 days FOLLOW-UP NUTRITION ASSESSMENT Dietary Orders (From admission, onward)     Start     Ordered  07/11/22 1228  Nutrition Supplements  (Nutrition Supplement Panel)  Once      Question Answer Comment Adult Supplements: Ensure Clear Danielle [name] [facility])  Supplement Frequency: BID    07/11/22 1228  06/20/22 1045  Diet Regular  DIET EFFECTIVE NOW      Question:  Initiate Nutrition Management Protocol (Yes/No?)  Answer:  Yes - Initiate Protocol  06/20/22 1045    No Known AllergiesANTHROPOMETRICS:Height:?70Admit wt:?79.4?kg?(per SNF documentation)Current wt: 75.1 kg (bed, 8/2); BMI: 23.76 kg/m?Dosing weight:?73.5?kg?(based on wt taken 7/4)? Additional wt information:Ht confirmed?with pt. No wt hx since 2019 per EMR. Wts have fluctuated since admit, likely r/t scale use vs fluid shifts vs some true wt change. Current wt down 4.3 kg (7.4%) from admit wt x 5 months, not significant for timeframe. Noted 1-2+ edema per RN flowsheets. Moderate muscle wasting visualized in clavicle and temple region. Moderate fat wasting visualized in buccal and orbital regions. Will continue utilize 73.5 kg as dosing wt d/t frequent fluctuations over admission. Will monitor trends as available.?  Wt: 06/24/22 73.5 kg 12/13/18 98.6 kg ?ESTIMATED NUTRITION REQUIREMENTS:Kcal/day:?1838???????????(25?kcal/kg)Protein/day:?74-88?????(1-1.2?gm/kg)Fluids/day:?1838?mL/d?(1 mL/kcal) ONLY as medical and fluid status allows and only per team discretion.Needs based on:?dosing wt (73.5 kg), maintenance, steroid?NUTRITION ASSESSMENT:?Chart reviewed for follow up assessment. Met with pt at bedside. She endorses a good appetite, but she's getting tired of the menu. Endorses having ~50% of 3 meals per day and drinking 1 Ensure Clear per day. Reports she cannot drink 2-3 Ensure Clear per day and she requested order be requested it be sent 1x with breakfast. Pt was not amenable to trying another supplement to further bolster intake/as an alternative. Pt endorses focusing on other foods high in protein like chicken and hamburgers to meet nutrition targets. Pt denied nutrition questions. RN flowsheets indicate pt had 100% of meals documented this week. Pt likely meeting nutrition targets, but would benefit from continued encouragement to consume high protein meals/snacks including Ensure Clear. ?Cultural/Religious/Ethnic Needs:?NoFood Allergies: None per pt?NUTRITION DIAGNOSIS:Severe malnutrition r/t depletion aeb moderate muscle and fat depletion. -- improving, continues?INTERVENTIONS/RECOMMENDATIONS:?Meals and Snacks:- Continue?Regular diet- Encourage small frequent meals/snacks- Encourage high kcal/protein foodsGoal:- Pt will consume >/=1 protein rich food with each meal-  met, will continue?Nutrition Supplement Therapy:- Will adjust order to Ensure Clear Berry daily per pt request?Goal:?- Pt to consume 2 Ensure Clear daily?- met, continue with goal of 1?Discharge Planning and Transfer of Nutrition Care:??Nutrition related discharge needs still being determined at this time, will continue to follow?MONITORING / EVALUATION:?Food/Nutrition-Related History:Food and Nutrient Intake?Anthropometric Measurements:Weight ?Biochemical Data, Medical Tests and Procedures:Electrolyte and renal profileGlucose/endocrine profile?Nutrition-Focus Physical FindingsOverall findings?Electronically signed by Molli Knock, MPH, RD on August 4, 2023Xcoverage for Sherrilee Gilles, RD [facility] [phone] note: Nutrition is a consult only service on weekends and holidays.  Please enter a consult in EPIC if assistance is needed on a weekend or holiday or via [facility] [phone] to contact the covering RD.

## 2022-07-25 NOTE — Progress Notes
[facility]	Medicine Progress NoteAttending Provider: Hulda Marin, MDDate of Admission: 2/21/2023Interim Events: Overnight: NAEOToday: - afebrile- pain better with increased gabapentin- a little sleepy yesterday but ok if left ankle pain is betterMeds: Scheduled Meds:Current Facility-Administered Medications Medication Dose Route Frequency Provider Last Rate Last Admin ? diclofenac (VOLTAREN) 1 % gel 4 g  4 g Topical (Top) 4x Daily Larna Daughters, MD   4 g at 07/24/22 0940 ? enoxaparin (LOVENOX) injection 80 mg  80 mg Subcutaneous Q12H Abid, Bushra, MD   80 mg at 07/24/22 2053 ? famotidine (PEPCID) tablet 20 mg  20 mg Oral Daily Senaida Lange, MD   20 mg at 07/24/22 1610 ? ferrous gluconate (FERGON) tablet 324 mg  324 mg Oral Every Other Day Larna Daughters, MD   324 mg at 07/23/22 9604 ? gabapentin (NEURONTIN) capsule 300 mg  300 mg Oral TID Jonna Coup, MD   300 mg at 07/24/22 2054 ? hydrOXYchloroQUINE (PLAQUENIL) tablet 200 mg  200 mg Oral Daily Babette Relic, MD   200 mg at 07/24/22 0940 ? lidocaine 4 % topical patch 1 patch  1 patch Transdermal Q24H Babette Relic, MD   1 patch at 07/20/22 1544 ? melatonin tablet 3 mg  3 mg Oral Nightly Pryor Ochoa, MD   3 mg at 07/24/22 2054 ? nystatin (MYCOSTATIN) powder 1 Application  1 Application  Topical (Top) BID Henriette Combs, MD   1 Application  at 07/24/22 [phone] ? predniSONE (DELTASONE) tablet 5 mg  5 mg Oral Daily Jerral Ralph, MD   5 mg at 07/24/22 8119 ? rosuvastatin (CRESTOR) tablet 10 mg  10 mg Per G Tube Nightly Dorisann Frames, MD   10 mg at 07/24/22 2054 Continuous Infusions:PRN Meds:acetaminophen, dextrose (GLUCOSE) oral liquid 15 g **OR** fruit juice **OR** skim milk, dextrose (GLUCOSE) oral liquid 30 g **OR** fruit juice, dextrose injection, dextrose injection, traMADoLObjective: Vitals: Temp:  [97.4 ?F (36.3 ?C)-97.8 ?F (36.6 ?C)] 97.8 ?F (36.6 ?C)Pulse:  [65-72] 65Resp:  [16-18] 16BP: (85-97)/(50-62) 96/61SpO2:  [97 %-99 %] 97 %Device (Oxygen Therapy): room air Weight: 75.1 kg I/O's: Intake/Output Summary (Last 24 hours) at 07/25/2022 0614Last data filed at 07/24/2022 1400Gross per 24 hour Intake 480 ml Output -- Net 480 ml  Physical Exam:General: Laying in bed in NADPulmonary: CTAB, no W/R/RCardiac: RRR, no M/R/GAbdomen: Soft, NT, ND, colostomy bag in place, gauze pads dated 8/2 w/o drainageNeuro: Pleasant, calm, answering questions appropriatelyExtremities: Contracted left foot/ankle, PODUS boot not worn Labs: MetabolicRecent Labs Lab 07/29/230552 NA 140 K 3.9 CL 108* CO2 24 BUN 14 CREATININE 0.34* ANIONGAP 8 CALCIUM 10.5* MG 1.9 PHOS 2.8  HematologicRecent Labs Lab 07/29/230552 WBC 5.3 HGB 11.1* HCT 35.90 PLT 268 NEUTROPHILS 44.7  HepaticRecent Labs Lab 07/29/230552 BILITOT <0.2 ALT 12 AST 12 ALKPHOS 59 PROT 7.1 ALBUMIN 3.7  SpecialNo results for input(s): URICACID, LDH, FIBRINOGEN, DDIMER, FERRITIN, CRP in the last 168 hours.Invalid input(s): ESR Assessment & Plan: Ms. Magnifico is a 66 yo conserved woman w/ PMH RA, multiple abdominal surgeries, chronic hypercalcemia with prolonged hospital stay at OSH and subsequent transfer to [facility] on 02/11/22 for encephalopathy and FTT, now resolved, but remains inpatient pending placement/T19 process. #DispositionPending T-19 approval/placement. Her sister [name]) is her guardian. Spend down is now complete and will qualify for LTC under Medicaid. - Appreciate SW/CM assistance#Rheumatoid arthritis#Osteoarthritis#Likely secondary Sjogren's#Immobility, contractures?- Rheumatology consulted, appreciate recs- C/w hydroxychloroquine 200mg  qd- Prednisone 5mg  qd, plan to taper 1mg  reduction per week starting 8/4- Voltaren gel, Tylenol, lidocaine patch, PRN tramadol-  cont gabapentin 300mg  TID - Continue PT#HypercalcemiaStable Ca at 10.5 (7/29) Alb 3.7. Previously seen by Endocrine. ?2/2 to prolonged immobility vs primary hyperparathyroidism. 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

## 2022-07-25 NOTE — Plan of Care
Plan of Care Overview/ Patient Status    1900-0700: A/o x4, forgetful, VSS on room air. C/o b/l foot pain, prn tramadol, tyelnol, & gabapentin  given with positive effect. Denies SOB/chest pain. ostomy in place, CDI,  purewick in place, incontinence care provided as needed. heels offloaded, please see flowsheet for full skin assessment. Pills whole. Assist x 2 in bed. T & R Q2 hours. Hourly rounding completed, safety maintained. Call bell within reach

## 2022-07-26 NOTE — Plan of Care
Plan of Care Overview/ Patient Status    1900-0700: A&Ox4. VSS on RA. Complaint of left shoulder and knee pain, prn tylenol given with good effect. Regular diet. Ostomy bag changed, LBM 07/25/22. Purewick in place, incontinent of urine, changed this shift, draining clear yellow urine. Incontinence care provided. LLE boot removed at bedtime. Ax2 in bed. Turned and repositioned q2hr. Daily vitals. Pills whole. No acute events this shift. Safety maintained. Call bell in reach. Bed alarm on. Hourly rounding completed.1610: Patient refusing morning labs, provider Alvina Filbert notified and okay with this. Problem: Adult Inpatient Plan of CareGoal: Plan of Care ReviewOutcome: Interventions implemented as appropriateGoal: Patient-Specific Goal (Individualized)Outcome: Interventions implemented as appropriateGoal: Absence of Hospital-Acquired Illness or InjuryOutcome: Interventions implemented as appropriateGoal: Optimal Comfort and WellbeingOutcome: Interventions implemented as appropriateGoal: Readiness for Transition of CareOutcome: Interventions implemented as appropriate Problem: Physical Therapy GoalsGoal: Physical Therapy GoalsDescription: PT GOALS - addressed 07/25/22.1. Patient will demonstrate active range of motion x 4 extremities in preparation for mobility (GOAL MET)2. Caregivers will be independent and safely implement a range of motion/positioning program to prevent loss of range of motion and skin integrity (GOAL MET)3. Patient will perform bed mobility with maximal assist (GOAL MET)4. Patient will tolerate sitting edge of bed >5 minutes with minimal assist (GOAL MET)5. Patient will perform transfers with moderate assist of 2 using rolling walker (ONGOING)6. Pt will perform bed mobility w/ modAx1 (ONGOING)7. Pt will perform STS transfer w/ modAx2 for a standing tolerance of 1 minute. (ONGOING)8. Pt will perform 3x10 BLE LAQs steadily seated EOB (I) (NEW) Outcome: Interventions implemented as appropriate Problem: Occupational Therapy GoalsGoal: Occupational Therapy GoalsDescription: OT goals1. Pt will move to EOB for seated ADL mod I2. Pt will follow 3/4 simple one step motor commands IND3. Pt will sequence through set up ADL task INDOT re-eval, Initial goals not met, continue to progress towards initial goalsRE-EVAL COMPLETED 7/31, GOALS NOT MET CARRYOVER FOR 8/25Outcome: Interventions implemented as appropriate Problem: Impaired Wound HealingGoal: Optimal Wound HealingOutcome: Interventions implemented as appropriate Problem: Fall Injury RiskGoal: Absence of Fall and Fall-Related InjuryOutcome: Interventions implemented as appropriate Problem: Skin Injury Risk IncreasedGoal: Skin Health and IntegrityOutcome: Interventions implemented as appropriate Problem: Mobility ImpairmentGoal: Optimal MobilityOutcome: Interventions implemented as appropriate Problem: Pain AcuteGoal: Acceptable Pain Control and Functional AbilityOutcome: Interventions implemented as appropriate Problem: Speech Language Pathology GoalsGoal: Dysphagia Goals, SLPDescription: Current goal: Patient will tolerate regular solids and thin liquids without adverse impact on respiratory status and while maintaining adequate oral nutrition.Prior goal (met): The patient will tolerate least restrictive diet without any decline in respiratory status.Prior goal (met): Pt will tolerate pureed diet with thin liquids w/o s/x of aspiration or adverse impact on respiratory status.Outcome: Interventions implemented as appropriate Problem: Self-Care DeficitGoal: Improved Ability to Complete Activities of Daily LivingOutcome: Interventions implemented as appropriate Problem: Fluid, Electrolyte and Nutrition Imbalance (Ileostomy)Goal: Fluid, Electrolyte and Nutrition BalanceOutcome: Interventions implemented as appropriate Problem: Infection (Ileostomy)Goal: Absence of Infection Signs and SymptomsOutcome: Interventions implemented as appropriate

## 2022-07-26 NOTE — Progress Notes
[facility]	Medicine Progress NoteAttending Provider: Darlina Rumpf [name]*Date of Admission: 2/21/2023Interim Events: Overnight: NAEOToday: - afebrile- some left shoulder pain, likely MSK- refused morning labs (scheduled weekly), didn't see the point of being poked againMeds: Scheduled Meds:Current Facility-Administered Medications Medication Dose Route Frequency Provider Last Rate Last Admin ? diclofenac (VOLTAREN) 1 % gel 4 g  4 g Topical (Top) 4x Daily Larna Daughters, MD   4 g at 07/25/22 2035 ? enoxaparin (LOVENOX) injection 80 mg  80 mg Subcutaneous Q12H Abid, Terri Piedra, MD   80 mg at 07/25/22 2033 ? famotidine (PEPCID) tablet 20 mg  20 mg Oral Daily Senaida Lange, MD   20 mg at 07/25/22 0841 ? ferrous gluconate (FERGON) tablet 324 mg  324 mg Oral Every Other Day Larna Daughters, MD   324 mg at 07/25/22 0841 ? gabapentin (NEURONTIN) capsule 300 mg  300 mg Oral TID Jonna Coup, MD   300 mg at 07/25/22 2033 ? hydrOXYchloroQUINE (PLAQUENIL) tablet 200 mg  200 mg Oral Daily Babette Relic, MD   200 mg at 07/25/22 0841 ? lidocaine 4 % topical patch 1 patch  1 patch Transdermal Q24H Babette Relic, MD   1 patch at 07/25/22 1314 ? melatonin tablet 3 mg  3 mg Oral Nightly Pryor Ochoa, MD   3 mg at 07/25/22 2033 ? nystatin (MYCOSTATIN) powder 1 Application  1 Application  Topical (Top) BID Henriette Combs, MD   1 Application  at 07/24/22 [phone] ? predniSONE (DELTASONE) tablet 4 mg  4 mg Oral Daily Jonna Coup, MD     ? rosuvastatin (CRESTOR) tablet 10 mg  10 mg Per G Tube Nightly Dorisann Frames, MD   10 mg at 07/25/22 2033 Continuous Infusions:PRN Meds:acetaminophen, dextrose (GLUCOSE) oral liquid 15 g **OR** fruit juice **OR** skim milk, dextrose (GLUCOSE) oral liquid 30 g **OR** fruit juice, dextrose injection, dextrose injection, traMADoLObjective: Vitals: Temp:  [98.6 ?F (37 ?C)-98.7 ?F (37.1 ?C)] 98.7 ?F (37.1 ?C)Pulse:  [60-72] 72Resp:  [16] 16BP: (91-95)/(59-62) 95/59SpO2:  [96 %-98 %] 96 %Device (Oxygen Therapy): room air Weight: 75.1 kg I/O's: Intake/Output Summary (Last 24 hours) at 07/26/2022 0813Last data filed at 07/26/2022 0500Gross per 24 hour Intake -- Output 1000 ml Net -1000 ml  Physical Exam:General: Laying in bed in NADPulmonary: CTAB, no W/R/RCardiac: RRR, no M/R/GAbdomen: Soft, NT, ND, colostomy bag in place, gauze pads over abdominal fistula dated 8/4 w/o drainageNeuro: Pleasant, calm, answering questions appropriatelyExtremities: Contracted left foot/ankle, PODUS boot not worn Labs: MetabolicNo results for input(s): NA, K, CL, CO2, BUN, CREATININE, ANIONGAP, CALCIUM, MG, PHOS in the last 168 hours. HematologicNo results for input(s): WBC, HGB, HCT, PLT, NEUTROPHILS, NEUTROABS, BANDSP, BLASTS in the last 168 hours.Invalid input(s): ANC, ALC HepaticNo results for input(s): BILITOT, BILIDIR, ALT, AST, ALKPHOS, PROT, ALBUMIN, PTT, LABPROT, INR in the last 168 hours. SpecialNo results for input(s): URICACID, LDH, FIBRINOGEN, DDIMER, FERRITIN, CRP in the last 168 hours.Invalid input(s): ESR Assessment & Plan: Danielle [name] is a 66 yo conserved woman w/ PMH RA, multiple abdominal surgeries, chronic hypercalcemia with prolonged hospital stay at OSH and subsequent transfer to [facility] on 02/11/22 for encephalopathy and FTT, now resolved, but remains inpatient pending placement/T19 process. #DispositionPending T-19 approval/placement. Her sister Danielle [name]) is her guardian. Spend down is now complete and will qualify for LTC under Medicaid. - Appreciate SW/CM assistance#Rheumatoid arthritis#Osteoarthritis#Likely secondary Sjogren's#Immobility, contractures?- Rheumatology consulted, appreciate recs- C/w hydroxychloroquine 200mg  qd- Prednisone 4mg  qd, plan to taper 1mg  reduction per week, next change 8/12- Voltaren gel, Tylenol,  lidocaine patch, PRN tramadol- cont gabapentin 300mg  TID - Continue PT#HypercalcemiaStable Ca at 10.5 (7/29) Alb 3.7. Previously seen by Endocrine. ?2/2 to prolonged immobility vs primary hyperparathyroidism. 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

## 2022-07-26 NOTE — Plan of Care
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 +UO,LLE boot in place, safety maintained.Problem: Adult Inpatient Plan of CareGoal: Plan of Care ReviewOutcome: Interventions implemented as appropriateGoal: Patient-Specific Goal (Individualized)Outcome: Interventions implemented as appropriateGoal: Absence of Hospital-Acquired Illness or InjuryOutcome: Interventions implemented as appropriateGoal: Optimal Comfort and WellbeingOutcome: Interventions implemented as appropriateGoal: Readiness for Transition of CareOutcome: Interventions implemented as appropriate Problem: Physical Therapy GoalsGoal: Physical Therapy GoalsDescription: PT GOALS - addressed 07/25/22.1. Patient will demonstrate active range of motion x 4 extremities in preparation for mobility (GOAL MET)2. Caregivers will be independent and safely implement a range of motion/positioning program to prevent loss of range of motion and skin integrity (GOAL MET)3. Patient will perform bed mobility with maximal assist (GOAL MET)4. Patient will tolerate sitting edge of bed >5 minutes with minimal assist (GOAL MET)5. Patient will perform transfers with moderate assist of 2 using rolling walker (ONGOING)6. Pt will perform bed mobility w/ modAx1 (ONGOING)7. Pt will perform STS transfer w/ modAx2 for a standing tolerance of 1 minute. (ONGOING)8. Pt will perform 3x10 BLE LAQs steadily seated EOB (I) (NEW) Outcome: Interventions implemented as appropriate Problem: Occupational Therapy GoalsGoal: Occupational Therapy GoalsDescription: OT goals1. Pt will move to EOB for seated ADL mod I2. Pt will follow 3/4 simple one step motor commands IND3. Pt will sequence through set up ADL task INDOT re-eval, Initial goals not met, continue to progress towards initial goalsRE-EVAL COMPLETED 7/31, GOALS NOT MET CARRYOVER FOR 8/25Outcome: Interventions implemented as appropriate Problem: Speech Language Pathology GoalsGoal: Dysphagia Goals, SLPDescription: Current goal: Patient will tolerate regular solids and thin liquids without adverse impact on respiratory status and while maintaining adequate oral nutrition.Prior goal (met): The patient will tolerate least restrictive diet without any decline in respiratory status.Prior goal (met): Pt will tolerate pureed diet with thin liquids w/o s/x of aspiration or adverse impact on respiratory status.Outcome: Interventions implemented as appropriate Problem: Impaired Wound HealingGoal: Optimal Wound HealingOutcome: Interventions implemented as appropriate Problem: Skin Injury Risk IncreasedGoal: Skin Health and IntegrityOutcome: Interventions implemented as appropriate Problem: Mobility ImpairmentGoal: Optimal MobilityOutcome: Interventions implemented as appropriate Problem: Pain AcuteGoal: Acceptable Pain Control and Functional AbilityOutcome: Interventions implemented as appropriate Problem: Self-Care DeficitGoal: Improved Ability to Complete Activities of Daily LivingOutcome: Interventions implemented as appropriate Problem: Fluid, Electrolyte and Nutrition Imbalance (Ileostomy)Goal: Fluid, Electrolyte and Nutrition BalanceOutcome: Interventions implemented as appropriate Problem: Infection (Ileostomy)Goal: Absence of Infection Signs and SymptomsOutcome: Interventions implemented as appropriate

## 2022-07-27 NOTE — Plan of Care
Plan of Care Overview/ Patient Status    1900-0700: A&Ox4. VSS on RA. Complaint of left shoulder and knee pain, prn tramadol given with good effect. Regular diet. Ostomy bag changed, LBM 07/26/22. Purewick in place, incontinent of urine, changed this shift, draining clear yellow urine. Incontinence care provided. LLE boot removed at bedtime. Ax2 in bed. Turned and repositioned q2hr. Daily vitals. Pills whole. No acute events this shift. Safety maintained. Call bell in reach. Bed alarm on. Hourly rounding completed.

## 2022-07-27 NOTE — Plan of Care
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 +UO,LLE boot?in place, safety maintained.Problem: Adult Inpatient Plan of CareGoal: Plan of Care ReviewOutcome: Interventions implemented as appropriateGoal: Patient-Specific Goal (Individualized)Outcome: Interventions implemented as appropriateGoal: Absence of Hospital-Acquired Illness or InjuryOutcome: Interventions implemented as appropriateGoal: Optimal Comfort and WellbeingOutcome: Interventions implemented as appropriateGoal: Readiness for Transition of CareOutcome: Interventions implemented as appropriate Problem: Physical Therapy GoalsGoal: Physical Therapy GoalsDescription: PT GOALS - addressed 07/25/22.1. Patient will demonstrate active range of motion x 4 extremities in preparation for mobility (GOAL MET)2. Caregivers will be independent and safely implement a range of motion/positioning program to prevent loss of range of motion and skin integrity (GOAL MET)3. Patient will perform bed mobility with maximal assist (GOAL MET)4. Patient will tolerate sitting edge of bed >5 minutes with minimal assist (GOAL MET)5. Patient will perform transfers with moderate assist of 2 using rolling walker (ONGOING)6. Pt will perform bed mobility w/ modAx1 (ONGOING)7. Pt will perform STS transfer w/ modAx2 for a standing tolerance of 1 minute. (ONGOING)8. Pt will perform 3x10 BLE LAQs steadily seated EOB (I) (NEW) Outcome: Interventions implemented as appropriate Problem: Occupational Therapy GoalsGoal: Occupational Therapy GoalsDescription: OT goals1. Pt will move to EOB for seated ADL mod I2. Pt will follow 3/4 simple one step motor commands IND3. Pt will sequence through set up ADL task INDOT re-eval, Initial goals not met, continue to progress towards initial goalsRE-EVAL COMPLETED 7/31, GOALS NOT MET CARRYOVER FOR 8/25Outcome: Interventions implemented as appropriate Problem: Speech Language Pathology GoalsGoal: Dysphagia Goals, SLPDescription: Current goal: Patient will tolerate regular solids and thin liquids without adverse impact on respiratory status and while maintaining adequate oral nutrition.Prior goal (met): The patient will tolerate least restrictive diet without any decline in respiratory status.Prior goal (met): Pt will tolerate pureed diet with thin liquids w/o s/x of aspiration or adverse impact on respiratory status.Outcome: Interventions implemented as appropriate Problem: Impaired Wound HealingGoal: Optimal Wound HealingOutcome: Interventions implemented as appropriate Problem: Fall Injury RiskGoal: Absence of Fall and Fall-Related InjuryOutcome: Interventions implemented as appropriate Problem: Skin Injury Risk IncreasedGoal: Skin Health and IntegrityOutcome: Interventions implemented as appropriate Problem: Mobility ImpairmentGoal: Optimal MobilityOutcome: Interventions implemented as appropriate Problem: Pain AcuteGoal: Acceptable Pain Control and Functional AbilityOutcome: Interventions implemented as appropriate Problem: Self-Care DeficitGoal: Improved Ability to Complete Activities of Daily LivingOutcome: Interventions implemented as appropriate Problem: Fluid, Electrolyte and Nutrition Imbalance (Ileostomy)Goal: Fluid, Electrolyte and Nutrition BalanceOutcome: Interventions implemented as appropriate Problem: Infection (Ileostomy)Goal: Absence of Infection Signs and SymptomsOutcome: Interventions implemented as appropriate

## 2022-07-27 NOTE — Progress Notes
[facility]	Medicine Progress NoteAttending Provider: Darlina Rumpf Davi*Date of Admission: 2/21/2023Interim Events: Overnight: NAEOToday: - unhappy about long wait for nursing homeMeds: Scheduled Meds:Current Facility-Administered Medications Medication Dose Route Frequency Provider Last Rate Last Admin ? diclofenac (VOLTAREN) 1 % gel 4 g  4 g Topical (Top) 4x Daily Soleymanjahi, Saeed, MD   4 g at 07/27/22 0816 ? enoxaparin (LOVENOX) injection 80 mg  80 mg Subcutaneous Q12H Abid, Bushra, MD   80 mg at 07/27/22 0816 ? famotidine (PEPCID) tablet 20 mg  20 mg Oral Daily Senaida Lange, MD   20 mg at 07/27/22 0817 ? ferrous gluconate (FERGON) tablet 324 mg  324 mg Oral Every Other Day Larna Daughters, MD   324 mg at 07/27/22 1610 ? gabapentin (NEURONTIN) capsule 300 mg  300 mg Oral TID Jonna Coup, MD   300 mg at 07/27/22 9604 ? hydrOXYchloroQUINE (PLAQUENIL) tablet 200 mg  200 mg Oral Daily Babette Relic, MD   200 mg at 07/27/22 5409 ? lidocaine 4 % topical patch 1 patch  1 patch Transdermal Q24H Babette Relic, MD   1 patch at 07/26/22 1315 ? melatonin tablet 3 mg  3 mg Oral Nightly Pryor Ochoa, MD   3 mg at 07/26/22 2000 ? nystatin (MYCOSTATIN) powder 1 Application  1 Application  Topical (Top) BID Henriette Combs, MD   1 Application  at 07/24/22 [phone] ? predniSONE (DELTASONE) tablet 4 mg  4 mg Oral Daily Jonna Coup, MD   4 mg at 07/27/22 1478 ? rosuvastatin (CRESTOR) tablet 10 mg  10 mg Per G Tube Nightly Dorisann Frames, MD   10 mg at 07/26/22 2000 Continuous Infusions:PRN Meds:acetaminophen, dextrose (GLUCOSE) oral liquid 15 g **OR** fruit juice **OR** skim milk, dextrose (GLUCOSE) oral liquid 30 g **OR** fruit juice, dextrose injection, dextrose injection, traMADoLObjective: Vitals: Temp:  [97.6 ?F (36.4 ?C)] 97.6 ?F (36.4 ?C)Pulse:  [66] 66Resp:  [16] 16BP: (103)/(64) 103/64SpO2:  [100 %] 100 %Device (Oxygen Therapy): room air Weight: 73.2 kg I/O's: Intake/Output Summary (Last 24 hours) at 07/27/2022 1117Last data filed at 07/27/2022 0230Gross per 24 hour Intake -- Output 650 ml Net -650 ml  Physical Exam:General: Laying in bed in NADPulmonary: CTAB, no W/R/RCardiac: RRR, no M/R/GAbdomen: Soft, NT, ND, colostomy bag in place, gauze pads over abdominal fistula dated 8/4 w/o drainageNeuro: Pleasant, calm, answering questions appropriatelyExtremities: Contracted left foot/ankle, PODUS boot not worn Labs: MetabolicNo results for input(s): NA, K, CL, CO2, BUN, CREATININE, ANIONGAP, CALCIUM, MG, PHOS in the last 168 hours. HematologicNo results for input(s): WBC, HGB, HCT, PLT, NEUTROPHILS, NEUTROABS, BANDSP, BLASTS in the last 168 hours.Invalid input(s): ANC, ALC HepaticNo results for input(s): BILITOT, BILIDIR, ALT, AST, ALKPHOS, PROT, ALBUMIN, PTT, LABPROT, INR in the last 168 hours. SpecialNo results for input(s): URICACID, LDH, FIBRINOGEN, DDIMER, FERRITIN, CRP in the last 168 hours.Invalid input(s): ESR Assessment & Plan: Ms. Yance is a 66 yo conserved woman w/ PMH RA, multiple abdominal surgeries, chronic hypercalcemia with prolonged hospital stay at OSH and subsequent transfer to [facility] on 02/11/22 for encephalopathy and FTT, now resolved, but remains inpatient pending placement/T19 process. #DispositionPending T-19 approval/placement. Her sister [name]) is her guardian. Spend down is now complete, waiting for Medicaid qualification, LTC - Appreciate SW/CM assistance#Rheumatoid arthritis#Osteoarthritis#Likely secondary Sjogren's#Immobility, contractures?- Rheumatology consulted, appreciate recs- C/w hydroxychloroquine 200mg  qd- Prednisone 4mg  qd, plan to taper 1mg  reduction per week, next change 8/12- Voltaren gel, Tylenol, lidocaine patch, PRN tramadol- cont gabapentin 300mg  TID - Continue PT#HypercalcemiaStable Ca at 10.5 (7/29) Alb  3.7. Previously seen by Endocrine. ?2/2 to prolonged immobility vs primary hyperparathyroidism. 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

## 2022-07-28 LAB — CBC WITH AUTO DIFFERENTIAL
BKR WAM ABSOLUTE IMMATURE GRANULOCYTES.: 0.02 x 1000/ÂµL (ref 0.00–0.30)
BKR WAM ABSOLUTE LYMPHOCYTE COUNT.: 1.81 x 1000/ÂµL (ref 0.60–3.70)
BKR WAM ABSOLUTE NRBC (2 DEC): 0 x 1000/ÂµL (ref 0.00–1.00)
BKR WAM ANALYZER ANC: 2.26 x 1000/ÂµL (ref 2.00–7.60)
BKR WAM BASOPHIL ABSOLUTE COUNT.: 0.03 x 1000/ÂµL (ref 0.00–1.00)
BKR WAM BASOPHILS: 0.6 % (ref 0.0–1.4)
BKR WAM EOSINOPHIL ABSOLUTE COUNT.: 0.31 x 1000/ÂµL (ref 0.00–1.00)
BKR WAM EOSINOPHILS: 6.3 % — ABNORMAL HIGH (ref 0.0–5.0)
BKR WAM HEMATOCRIT (2 DEC): 36.4 % (ref 35.00–45.00)
BKR WAM HEMOGLOBIN: 11.5 g/dL — ABNORMAL LOW (ref 11.7–15.5)
BKR WAM IMMATURE GRANULOCYTES: 0.4 % (ref 0.0–1.0)
BKR WAM LYMPHOCYTES: 36.6 % (ref 17.0–50.0)
BKR WAM MCH (PG): 28.7 pg (ref 27.0–33.0)
BKR WAM MCHC: 31.6 g/dL (ref 31.0–36.0)
BKR WAM MCV: 90.8 fL (ref 80.0–100.0)
BKR WAM MONOCYTE ABSOLUTE COUNT.: 0.51 x 1000/ÂµL (ref 0.00–1.00)
BKR WAM MONOCYTES: 10.3 % (ref 4.0–12.0)
BKR WAM MPV: 10.3 fL (ref 8.0–12.0)
BKR WAM NEUTROPHILS: 45.8 % (ref 39.0–72.0)
BKR WAM NUCLEATED RED BLOOD CELLS: 0 % (ref 0.0–1.0)
BKR WAM PLATELETS: 289 x1000/ÂµL (ref 150–420)
BKR WAM RDW-CV: 17 % — ABNORMAL HIGH (ref 11.0–15.0)
BKR WAM RED BLOOD CELL COUNT.: 4.01 M/ÂµL (ref 4.00–6.00)
BKR WAM WHITE BLOOD CELL COUNT: 4.9 x1000/ÂµL (ref 4.0–11.0)

## 2022-07-28 LAB — BASIC METABOLIC PANEL
BKR ANION GAP: 8 (ref 7–17)
BKR BLOOD UREA NITROGEN: 14 mg/dL (ref 8–23)
BKR BUN / CREAT RATIO: 35 — ABNORMAL HIGH (ref 8.0–23.0)
BKR CALCIUM: 9.9 mg/dL (ref 8.8–10.2)
BKR CHLORIDE: 110 mmol/L — ABNORMAL HIGH (ref 98–107)
BKR CO2: 22 mmol/L (ref 20–30)
BKR CREATININE: 0.4 mg/dL (ref 0.40–1.30)
BKR EGFR, CREATININE (CKD-EPI 2021): >60 mL/min/{1.73_m2} (ref >=60)
BKR GLUCOSE: 88 mg/dL (ref 70–100)
BKR POTASSIUM: 3.9 mmol/L (ref 3.3–5.3)
BKR SODIUM: 140 mmol/L (ref 136–144)

## 2022-07-28 MED ORDER — LIDOCAINE 4 % TOPICAL PATCH
4 % | Freq: Every day | TRANSDERMAL | Status: CP | PRN
Start: 2022-07-28 — End: ?

## 2022-07-28 NOTE — Plan of Care
Plan of Care Overview/ Patient Status    0700-1900Patient is A&Ox4. VSS on RA. C/o L knee pain, PRN tramadol given with +effect. Denies CP, SOB, N/V. Assist x2 in bed. Incontinent of urine, Purewick in place. Ileostomy in place, +flatus/stool. Dressings on LUQ changed. Pills whole. Good po intake. All scheduled medications administered as ordered. LLE boot in place. Q2hr T&R performed. Safety maintained. Bed alarm on, hourly rounding performed, call bell within reach. Will continue to monitor.

## 2022-07-28 NOTE — Plan of Care
Plan of Care Overview/ Patient Status    1900-0700: A/o x4, forgetful, VSS on room air. C/o b/l foot pain, prn tramadol, tyelnol, & gabapentin  given with positive effect. Denies SOB/chest pain. ostomy in place, CDI, changed this shift, purewick in place, incontinence care provided as needed. heels offloaded, please see flowsheet for full skin assessment. Pills whole. Assist x 2 in bed. T & R Q2 hours. Hourly rounding completed, safety maintained. Call bell within reach

## 2022-07-28 NOTE — Plan of Care
Inpatient Physical Therapy Progress Note IP Adult PT Eval/Treat - 07/28/22 1235    Date of Visit / Treatment  Date of Visit / Treatment 07/28/22   Note Type Progress Note   Progress Report Due 09/24/22   End Time 1235    General Information  Subjective Don't work too hard.   General Observations Pt received supine in bed on EP 9-7; on RA; NAD; ostomy; purwick; LLE boot bedside; RN cleared   Precautions/Limitations Fall Precautions;Bed alarm;Chair alarm   Precautions/Limitations Comment Aspiration Precaution    Weight Bearing Status  Weight Bearing Status Comments WBAT BLE    Vital Signs and Orthostatic Vital Signs  Vital Signs Vital Signs Stable   Vital Signs Free text on RA, NAD    Pain/Comfort  Pain Comment (Pre/Post Treatment Pain) Pt c/o unquantified incr L knee/ankle pain w/ mobility; RN aware and repositioned for comfort    Patient Coping  Observed Emotional State accepting;cooperative   Verbalized Emotional State acceptance    Cognition  Overall Cognitive Status WFL   Orientation Level Oriented X4   Level of Consciousness alert   Following Commands Follows one step commands without difficulty   Personal Safety / Judgment Fall risk;Requires supervision with mobility   Cognition Comments Pleasant; can be forgetful    Range of Motion  Range of Motion Comments Limited terminal hip/knee extension; developing B ankle PF contractures (L>R); limited shoulder elevation R>L)    Manual Muscle Testing  Manual Muscle Testing Comments + Deconditioned    Skin Assessment  Skin Assessment See Nursing Documentation    Balance  Sitting Balance: Static  POOR+    Minimal assist to maintain static position with no Assistive Device   Sitting Balance: Dynamic  POOR     Moves through 1/4 to 1/2 ROM range with moderate assist to right self   Balance Assist Device Hand held assist   Balance Skills Training Comment Ax1    Bed Mobility  Supine-to-Sit Independence/Assistance Level Maximum assist;Assist of 1   Supine-to-Sit Assist Device Bed rails;Hand held assist;Head of bed elevated   Sit-to-Supine Independence/Assistance Level Maximum assist;Assist of 1   Sit-to-Supine Assist Device Bed rails;Hand held assist;Head of bed elevated   Bed Mobility, Impairments pain;impaired balance;strength decreased;ROM decreased   Bed Mobility Comments Assist to trunk and BLE; retropulsive w/ L lateral lean seated EOB performing therex, cues to correct; PROM to RUE; crossbody reaches    Museum/gallery curator Comments Deferred d/t pain, weakness and incr need for skilled assist    Handoff Documentation  Handoff Patient in bed;Bed alarm;Patient instructed to call nursing for mobility;Discussed with nursing    Endurance  Endurance Comments Poor    PT- AM-PAC - Basic Mobility Screen- How much help from another person do you currently need.....  Turning from your back to your side while in a a flat bed without using rails? 2 - A Lot - Requires a lot of help (maximum to moderate assistance). Can use assistive devices.   Moving from lying on your back to sitting on the side of a flat bed without using bed rails? 1 - Total - Requires total assistance or cannot do it at all.   Moving to and from a bed to a chair (including a wheelchair)? 1 - Total - Requires total assistance or cannot do it at all.   Standing up from a chair using your arms(e.g., wheelchair or bedside chair)? 1 - Total - Requires total assistance or cannot do it at all.  To walk in a hospital room? 1 - Total - Requires total assistance or cannot do it at all.   Climbing 3-5 steps with a railing? 1 - Total - Requires total assistance or cannot do it at all.   AMPAC Mobility Score 7   TARGET Highest Level of Mobility Mobility Level 2, Turn self in bed/bed activity/dependent transfer    ACTUAL Highest Level of Mobility Mobility Level 3, Sit on edge of bed    Therapeutic Exercise  Therapeutic Exercise Comments Reviewed and encouraged AROM x4; PROM RUE/BLE    Neuromuscular Re-education  Neuromuscular Re-education Comments Seated static/dynamic PC training    Therapeutic Functional Activity  Therapeutic Functional Activity Comments Bed mobility; Pt edu    Orthotic Fabrication/Training  Orthotics Fabrication/Training Comments L boot    Clinical Impression  Follow up Assessment Pt seen for PT f/u. Requires maxA for bed mobility. Tolerates BLE/BUE therex, crossbody reaches well seated EOB. Cues for improved static/dynamic seated postural control to prevent LOB. Will benefit from skilled services to improve grosss functional ROM/strength, static/dynamic PC, and activity tolerance. Current PT d/c rec is SNF for LTC.   Criteria for Skilled Therapeutic Interventions Met yes    Patient/Family Stated Goals  Patient/Family Stated Goal(s) return to prior level of functioning;decrease pain;feel better    Frequency/Equipment Recommendations  PT Frequency 2x per week   What day of week is next treatment expected? Thursday   8/10  PT/PTA completing this assessment RL   Equipment Needs During Admission/Treatment Hospital bed;Hoyer lift    PT Recommendations for Inpatient Admission  Activity/Level of Assist dangle edge of bed;assist of 1   ADL Recommendations assist of 2   Positioning chair position of the bed;reposition frequently;elevate lower extremities   Therapeutic Exercise ROM as tolerated   Other/Comments Pillow between knees in supine for contracture management and prevention of skin breakdown; encourage B knee extension in supine; encourage B podus boots or shorten bed or foam to maintain 0 deg of ankle DF    Planned Treatment / Interventions  Plan for Next Visit Progress as tol   Training Treatment / Interventions Balance / Investment banker, operational;Endurance Training;Functional Personnel officer / Interventions Patient Education / Training    PT Discharge Summary  Physical Therapy Disposition Recommendation SNF for long term placement     Shanon Payor PT, DPTMHB: [phone]

## 2022-07-29 NOTE — Progress Notes
[facility]	Medicine Progress NoteAttending Provider: Hulda [name], MDDate of Admission: 2/21/2023Interim Events: Overnight: NAEOToday: - some moderate pain and warmth in left knee since last night, tramadol with some effectMeds: Scheduled Meds:Current Facility-Administered Medications Medication Dose Route Frequency Provider Last Rate Last Admin ? diclofenac (VOLTAREN) 1 % gel 4 g  4 g Topical (Top) 4x Daily Danielle Daughters, MD   4 g at 07/28/22 4782 ? enoxaparin (LOVENOX) injection 80 mg  80 mg Subcutaneous Q12H [name], Bushra, MD   80 mg at 07/28/22 0920 ? famotidine (PEPCID) tablet 20 mg  20 mg Oral Daily Danielle Lange, MD   20 mg at 07/28/22 0920 ? ferrous gluconate (FERGON) tablet 324 mg  324 mg Oral Every Other Day Danielle Daughters, MD   324 mg at 07/27/22 9562 ? gabapentin (NEURONTIN) capsule 300 mg  300 mg Oral TID Danielle Coup, MD   300 mg at 07/28/22 0920 ? hydrOXYchloroQUINE (PLAQUENIL) tablet 200 mg  200 mg Oral Daily Danielle Relic, MD   200 mg at 07/28/22 0920 ? lidocaine 4 % topical patch 1 patch  1 patch Transdermal Q24H Danielle Relic, MD   1 patch at 07/27/22 1337 ? melatonin tablet 3 mg  3 mg Oral Nightly Danielle Ochoa, MD   3 mg at 07/27/22 2020 ? nystatin (MYCOSTATIN) powder 1 Application  1 Application  Topical (Top) BID Danielle Combs, MD   1 Application  at 07/28/22 [phone] ? predniSONE (DELTASONE) tablet 4 mg  4 mg Oral Daily Danielle Coup, MD   4 mg at 07/28/22 0920 ? rosuvastatin (CRESTOR) tablet 10 mg  10 mg Per G Tube Nightly Danielle Frames, MD   10 mg at 07/27/22 2020 Continuous Infusions:PRN Meds:acetaminophen, dextrose (GLUCOSE) oral liquid 15 g **OR** fruit juice **OR** skim milk, dextrose (GLUCOSE) oral liquid 30 g **OR** fruit juice, dextrose injection, dextrose injection, Lidocaine Patch, traMADoLObjective: Vitals: Temp:  [97.6 ?F (36.4 ?C)] 97.6 ?F (36.4 ?C)Pulse:  [60] 60Resp:  [20] 20BP: (106)/(62) 106/62SpO2:  [100 %] 100 %Device (Oxygen Therapy): room air Weight: 73.2 kg I/O's: Intake/Output Summary (Last 24 hours) at 07/28/2022 1133Last data filed at 07/27/2022 1800Gross per 24 hour Intake -- Output 300 ml Net -300 ml  Physical Exam:General: Laying in bed in NADPulmonary: CTAB, no W/R/RCardiac: RRR, no M/R/GAbdomen: Soft, NT, ND, colostomy bag in place, gauze pads over abdominal fistula dated 8/4 w/o drainageNeuro: Pleasant, calm, answering questions appropriatelyExtremities: Contracted left foot/ankle. L knee w/ minimal effusion, warmer than right Labs: MetabolicRecent Labs Lab 08/07/230655 NA 140 K 3.9 CL 110* CO2 22 BUN 14 CREATININE 0.40 ANIONGAP 8 CALCIUM 9.9  HematologicRecent Labs Lab 08/07/230655 WBC 4.9 HGB 11.5* HCT 36.40 PLT 289 NEUTROPHILS 45.8  HepaticNo results for input(s): BILITOT, BILIDIR, ALT, AST, ALKPHOS, PROT, ALBUMIN, PTT, LABPROT, INR in the last 168 hours. SpecialNo results for input(s): URICACID, LDH, FIBRINOGEN, DDIMER, FERRITIN, CRP in the last 168 hours.Invalid input(s): ESR Assessment & Plan: Danielle [name] is a 66 yo conserved woman w/ PMH RA, multiple abdominal surgeries, chronic hypercalcemia with prolonged hospital stay at OSH and subsequent transfer to [facility] on 02/11/22 for encephalopathy and FTT, now resolved, but remains inpatient pending placement/T19 process. #DispositionPending T-19 approval/placement. Her sister Danielle [name]) is her guardian. Spend down is now complete, waiting for Medicaid qualification, LTC - Appreciate SW/CM assistance#Rheumatoid arthritis#Osteoarthritis#Likely secondary Sjogren's#Immobility, contractures?Left knee w/ moderate pain on palpation since last night, warmer than right. Is currently on maintenance RA medications, will manage conservatively w/ ice packs, lidocaine. If no improvement or  worsening, will re-engage Rheumatology. Low concern for gout/pseudogout, septic joint- Rheumatology consulted, appreciate recs- C/w hydroxychloroquine 200mg  qd- Prednisone 4mg  qd, plan to taper 1mg  reduction per week, next change 8/12- Voltaren gel, Tylenol, lidocaine patch, PRN tramadol- cont gabapentin 300mg  TID - ice pack PRN- Continue PT#Hypercalcemia, now normalizedStable Ca at 9.9 (8/7) Alb 3.7. Previously seen by Endocrine. ?2/2 to prolonged immobility vs primary hyperparathyroidism. 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

## 2022-07-29 NOTE — Plan of Care
Plan of Care Overview/ Patient Status    1900-0700: A/o x4, forgetful, VSS on room air. C/o b/l foot pain, prn tramadol, tyelnol, & gabapentin  given with positive effect. Denies SOB/chest pain. ostomy in place, CDI,  purewick in place, incontinence care provided as needed. heels offloaded, please see flowsheet for full skin assessment. Pills whole. Assist x 2 in bed. T & R Q2 hours. Hourly rounding completed, safety maintained. Call bell within reach

## 2022-07-29 NOTE — Plan of Care
Plan of Care Overview/ Patient Status    Patient is A&Ox4. VSS on RA.Denies CP, SOB, N/V. Assist x2 in bed. Incontinent of urine, Purewick in place. Ileostomy in place, +flatus/stool. Dressings on abdominal , dry and intact. Pills whole. Good po intake. All scheduled medications administered as ordered. LLE boot in place. Q2hr T&R performed. Safety maintained. Bed alarm on, hourly rounding performed, call bell within reach. Will continue to monitor.

## 2022-07-29 NOTE — Progress Notes
[facility]	Medicine Progress NoteAttending Provider: Hulda Marin, MDDate of Admission: 2/21/2023Interim Events: Overnight: NAEOToday: - brushing her teeth this morning- no issues with L knee pain, tramadol with some effect- patient eating well, says she wants to watch her weightMeds: Scheduled Meds:Current Facility-Administered Medications Medication Dose Route Frequency Provider Last Rate Last Admin ? diclofenac (VOLTAREN) 1 % gel 4 g  4 g Topical (Top) 4x Daily Soleymanjahi, Saeed, MD   4 g at 07/29/22 0919 ? enoxaparin (LOVENOX) injection 80 mg  80 mg Subcutaneous Q12H Abid, Bushra, MD   80 mg at 07/29/22 0919 ? famotidine (PEPCID) tablet 20 mg  20 mg Oral Daily Senaida Lange, MD   20 mg at 07/29/22 9604 ? ferrous gluconate (FERGON) tablet 324 mg  324 mg Oral Every Other Day Larna Daughters, MD   324 mg at 07/29/22 5409 ? gabapentin (NEURONTIN) capsule 300 mg  300 mg Oral TID Jonna Coup, MD   300 mg at 07/29/22 1416 ? hydrOXYchloroQUINE (PLAQUENIL) tablet 200 mg  200 mg Oral Daily Babette Relic, MD   200 mg at 07/29/22 0919 ? lidocaine 4 % topical patch 1 patch  1 patch Transdermal Q24H Babette Relic, MD   1 patch at 07/29/22 1416 ? melatonin tablet 3 mg  3 mg Oral Nightly Pryor Ochoa, MD   3 mg at 07/28/22 2129 ? nystatin (MYCOSTATIN) powder 1 Application  1 Application  Topical (Top) BID Henriette Combs, MD   1 Application  at 07/29/22 [phone] ? predniSONE (DELTASONE) tablet 4 mg  4 mg Oral Daily Jonna Coup, MD   4 mg at 07/29/22 1478 ? rosuvastatin (CRESTOR) tablet 10 mg  10 mg Per G Tube Nightly Dorisann Frames, MD   10 mg at 07/28/22 2129 Continuous Infusions:PRN Meds:acetaminophen, dextrose (GLUCOSE) oral liquid 15 g **OR** fruit juice **OR** skim milk, dextrose (GLUCOSE) oral liquid 30 g **OR** fruit juice, dextrose injection, dextrose injection, Lidocaine Patch, traMADoLObjective: Vitals: Temp:  [97.4 ?F (36.3 ?C)-98.6 ?F (37 ?C)] 98.6 ?F (37 ?C)Pulse:  [39-68] 39Resp:  [16-18] 16BP: (96-101)/(59-63) 101/61SpO2:  [99 %-100 %] 100 %Device (Oxygen Therapy): room air Weight: 74.1 kg I/O's: Intake/Output Summary (Last 24 hours) at 07/29/2022 1502Last data filed at 07/29/2022 1030Gross per 24 hour Intake -- Output 900 ml Net -900 ml  Physical Exam:General: Laying in bed in NADPulmonary: CTAB, no W/R/RCardiac: RRR, no M/R/GAbdomen: Soft, NT, ND, colostomy bag in place, gauze pads over abdominal fistula dated 8/4 w/o drainageNeuro: Pleasant, calm, answering questions appropriatelyExtremities: Contracted left foot/ankle. L knee w/ minimal effusion, warmer than right Labs: MetabolicRecent Labs Lab 08/07/230655 NA 140 K 3.9 CL 110* CO2 22 BUN 14 CREATININE 0.40 ANIONGAP 8 CALCIUM 9.9  HematologicRecent Labs Lab 08/07/230655 WBC 4.9 HGB 11.5* HCT 36.40 PLT 289 NEUTROPHILS 45.8  HepaticNo results for input(s): BILITOT, BILIDIR, ALT, AST, ALKPHOS, PROT, ALBUMIN, PTT, LABPROT, INR in the last 168 hours. SpecialNo results for input(s): URICACID, LDH, FIBRINOGEN, DDIMER, FERRITIN, CRP in the last 168 hours.Invalid input(s): ESR Assessment & Plan: Danielle [name] is a 66 yo conserved woman w/ PMH RA, multiple abdominal surgeries, chronic hypercalcemia with prolonged hospital stay at OSH and subsequent transfer to [name] on 02/11/22 for encephalopathy and FTT, now resolved, but remains inpatient pending placement/T19 process. #DispositionPending T-19 approval/placement. Her sister Danielle [name]) is her guardian. Spend down is now complete, waiting for Medicaid qualification, LTC - Appreciate SW/CM assistance#Rheumatoid arthritis#Osteoarthritis#Likely secondary Sjogren's#Immobility, contractures?Left knee w/ moderate pain on palpation since last night, warmer than right. Is currently on  maintenance RA medications, will manage conservatively w/ ice packs, lidocaine. If no improvement or worsening, will re-engage Rheumatology. Low concern for gout/pseudogout, septic joint- Rheumatology consulted, appreciate recs- C/w hydroxychloroquine 200mg  qd- Prednisone 4mg  qd, plan to taper 1mg  reduction per week, next change 8/12- Voltaren gel, Tylenol, lidocaine patch, PRN tramadol, ice packs prn- cont gabapentin 300mg  TID - Continue PT#Hypercalcemia, now normalizedStable Ca at 9.9 (8/7) Alb 3.7. Previously seen by Endocrine. ?2/2 to prolonged immobility vs primary hyperparathyroidism. 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

## 2022-07-30 NOTE — Plan of Care
Plan of Care Overview/ Patient Status    1900 - 0700 A&Ox4. Resting in bed overnight. Moderate pain w/activity/repositioning, scheduled analgesics and PRN 25mg  tramadol given. Encouraged to T&P, pt often refuses, education provided. Ostomy changed this shift. No acute issues or concerns overnight. T19 pending. Hourly rounding performed and safety maintained. Please continue to follow POC. See flow sheets for complete assessment. Problem: Adult Inpatient Plan of CareGoal: Plan of Care ReviewOutcome: Interventions implemented as appropriateFlowsheets (Taken 07/30/2022 0308)Progress: no changePlan of Care Reviewed With: patient

## 2022-07-30 NOTE — Plan of Care
Plan of Care Overview/ Patient Status    0700-1900Patient is A&Ox4. VSS on RA.Denies CP, SOB, N/V. Assist x2 in bed. Incontinent of urine, Purewick in place. Ileostomy in place, +flatus/stool. Dressings on LUQ changed. Pills whole. Good po intake. All scheduled medications administered as ordered. LLE boot in place. Q2hr T&R performed. Safety maintained. Bed alarm on, hourly rounding performed, call bell within reach. Will continue to monitor.

## 2022-07-30 NOTE — Progress Notes
[facility]	Medicine Progress NoteAttending Provider: Hulda Marin, MDDate of Admission: 2/21/2023Interim Events: Overnight: NAEOToday: - dozing in bed- stayed up to 230am last night speaking with Marcelino Duster- no issues with L knee pain, tramadol with good effectMeds: Scheduled Meds:Current Facility-Administered Medications Medication Dose Route Frequency Provider Last Rate Last Admin ? diclofenac (VOLTAREN) 1 % gel 4 g  4 g Topical (Top) 4x Daily Larna Daughters, MD   4 g at 07/30/22 0831 ? enoxaparin (LOVENOX) injection 80 mg  80 mg Subcutaneous Q12H Abid, Bushra, MD   80 mg at 07/30/22 0831 ? famotidine (PEPCID) tablet 20 mg  20 mg Oral Daily Senaida Lange, MD   20 mg at 07/30/22 0830 ? ferrous gluconate (FERGON) tablet 324 mg  324 mg Oral Every Other Day Larna Daughters, MD   324 mg at 07/29/22 1610 ? gabapentin (NEURONTIN) capsule 300 mg  300 mg Oral TID Jonna Coup, MD   300 mg at 07/30/22 0831 ? hydrOXYchloroQUINE (PLAQUENIL) tablet 200 mg  200 mg Oral Daily Babette Relic, MD   200 mg at 07/30/22 0831 ? lidocaine 4 % topical patch 1 patch  1 patch Transdermal Q24H Babette Relic, MD   1 patch at 07/29/22 1416 ? melatonin tablet 3 mg  3 mg Oral Nightly Pryor Ochoa, MD   3 mg at 07/29/22 2225 ? nystatin (MYCOSTATIN) powder 1 Application  1 Application  Topical (Top) BID Henriette Combs, MD   1 Application  at 07/30/22 0831 ? predniSONE (DELTASONE) tablet 4 mg  4 mg Oral Daily Jonna Coup, MD   4 mg at 07/30/22 0830 ? rosuvastatin (CRESTOR) tablet 10 mg  10 mg Per G Tube Nightly Dorisann Frames, MD   10 mg at 07/29/22 2222 Continuous Infusions:PRN Meds:acetaminophen, dextrose (GLUCOSE) oral liquid 15 g **OR** fruit juice **OR** skim milk, dextrose (GLUCOSE) oral liquid 30 g **OR** fruit juice, dextrose injection, dextrose injection, Lidocaine Patch, traMADoLObjective: Vitals: Temp:  [97.3 ?F (36.3 ?C)-98.6 ?F (37 ?C)] 97.3 ?F (36.3 ?C)Pulse:  [39-75] 61Resp:  [16] 16BP: (92-101)/(59-61) 95/61SpO2:  [96 %-100 %] 100 %Device (Oxygen Therapy): room air Weight: 74.1 kg I/O's: Intake/Output Summary (Last 24 hours) at 07/30/2022 1323Last data filed at 07/29/2022 2200Gross per 24 hour Intake -- Output 750 ml Net -750 ml  Physical Exam:General: Laying in bed in NADPulmonary: CTAB, no W/R/RCardiac: RRR, no M/R/GAbdomen: Soft, NT, ND, colostomy bag in place with gas and formed stool, gauze pads over abdominal fistula dated w/o drainageNeuro: Pleasant, calm, answering questions appropriatelyExtremities: Contracted left foot/ankle. L knee w/o effusion, tenderness to palpation Labs: MetabolicRecent Labs Lab 08/07/230655 NA 140 K 3.9 CL 110* CO2 22 BUN 14 CREATININE 0.40 ANIONGAP 8 CALCIUM 9.9  HematologicRecent Labs Lab 08/07/230655 WBC 4.9 HGB 11.5* HCT 36.40 PLT 289 NEUTROPHILS 45.8  HepaticNo results for input(s): BILITOT, BILIDIR, ALT, AST, ALKPHOS, PROT, ALBUMIN, PTT, LABPROT, INR in the last 168 hours. SpecialNo results for input(s): URICACID, LDH, FIBRINOGEN, DDIMER, FERRITIN, CRP in the last 168 hours.Invalid input(s): ESR Assessment & Plan: Ms. Werkman is a 66 yo conserved woman w/ PMH RA, multiple abdominal surgeries, chronic hypercalcemia with prolonged hospital stay at OSH and subsequent transfer to [facility] on 02/11/22 for encephalopathy and FTT, now resolved, but remains inpatient pending placement/T19 process. #DispositionPending T-19 approval/placement. Her sister [name]) is her guardian. 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 Is currently on maintenance RA medications, will manage  conservatively w/ ice packs, lidocaine. If no improvement or worsening, will re-engage Rheumatology. Low concern for gout/pseudogout, septic joint- Rheumatology consulted, appreciate recs- C/w hydroxychloroquine 200mg  qd- Prednisone 4mg  qd, plan to taper 1mg  reduction per week, next change 8/12- Voltaren gel, Tylenol, lidocaine patch, PRN tramadol, ice packs prn- cont gabapentin 300mg  TID - Continue PT#Hypercalcemia, now normalizedStable Ca at 9.9 (8/7) Alb 3.7. Previously seen by Endocrine. ?2/2 to prolonged immobility vs primary hyperparathyroidism. 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

## 2022-07-31 NOTE — Progress Notes
[facility]	Medicine Progress NoteAttending Provider: Hulda Marin, MDDate of Admission: 2/21/2023Interim Events: Overnight: NAEOToday: - Did not feel like going to the [facility] today- no issues with L knee painMeds: Scheduled Meds:Current Facility-Administered Medications Medication Dose Route Frequency Provider Last Rate Last Admin ? diclofenac (VOLTAREN) 1 % gel 4 g  4 g Topical (Top) 4x Daily Larna Daughters, MD   4 g at 07/31/22 0930 ? enoxaparin (LOVENOX) injection 80 mg  80 mg Subcutaneous Q12H Abid, Bushra, MD   80 mg at 07/31/22 0929 ? famotidine (PEPCID) tablet 20 mg  20 mg Oral Daily Senaida Lange, MD   20 mg at 07/31/22 0929 ? ferrous gluconate (FERGON) tablet 324 mg  324 mg Oral Every Other Day Larna Daughters, MD   324 mg at 07/31/22 0929 ? gabapentin (NEURONTIN) capsule 300 mg  300 mg Oral TID Jonna Coup, MD   300 mg at 07/31/22 1610 ? hydrOXYchloroQUINE (PLAQUENIL) tablet 200 mg  200 mg Oral Daily Babette Relic, MD   200 mg at 07/31/22 9604 ? lidocaine 4 % topical patch 1 patch  1 patch Transdermal Q24H Babette Relic, MD   1 patch at 07/30/22 1507 ? melatonin tablet 3 mg  3 mg Oral Nightly Pryor Ochoa, MD   3 mg at 07/29/22 2225 ? nystatin (MYCOSTATIN) powder 1 Application  1 Application  Topical (Top) BID Henriette Combs, MD   1 Application  at 07/31/22 0930 ? predniSONE (DELTASONE) tablet 4 mg  4 mg Oral Daily Jonna Coup, MD   4 mg at 07/31/22 5409 ? rosuvastatin (CRESTOR) tablet 10 mg  10 mg Per G Tube Nightly Dorisann Frames, MD   10 mg at 07/30/22 2108 Continuous Infusions:PRN Meds:acetaminophen, dextrose (GLUCOSE) oral liquid 15 g **OR** fruit juice **OR** skim milk, dextrose (GLUCOSE) oral liquid 30 g **OR** fruit juice, dextrose injection, dextrose injection, Lidocaine Patch, traMADoLObjective: Vitals: Temp:  [98 ?F (36.7 ?C)] 98 ?F (36.7 ?C)Pulse:  [65] 65Resp:  [18] 18BP: (103)/(70) 103/70SpO2:  [98 %] 98 %Device (Oxygen Therapy): room air Weight: 74.1 kg I/O's: Intake/Output Summary (Last 24 hours) at 07/31/2022 1049Last data filed at 07/31/2022 0500Gross per 24 hour Intake -- Output 1770 ml Net -1770 ml  Physical Exam:General: Laying in bed in NADPulmonary: CTAB, no W/R/RCardiac: RRR, no M/R/GAbdomen: Soft, NT, ND, colostomy bag in place with flatus and formed stool, gauze pads over abdominal fistula w/o drainageNeuro: Pleasant, calm, answering questions appropriatelyExtremities: Contracted left foot/ankle. L knee w/o effusion or tenderness to palpation Labs: MetabolicRecent Labs Lab 08/07/230655 NA 140 K 3.9 CL 110* CO2 22 BUN 14 CREATININE 0.40 ANIONGAP 8 CALCIUM 9.9  HematologicRecent Labs Lab 08/07/230655 WBC 4.9 HGB 11.5* HCT 36.40 PLT 289 NEUTROPHILS 45.8  HepaticNo results for input(s): BILITOT, BILIDIR, ALT, AST, ALKPHOS, PROT, ALBUMIN, PTT, LABPROT, INR in the last 168 hours. SpecialNo results for input(s): URICACID, LDH, FIBRINOGEN, DDIMER, FERRITIN, CRP in the last 168 hours.Invalid input(s): ESR Assessment & Plan: Ms. Lander is a 66 yo conserved woman w/ PMH RA, multiple abdominal surgeries, chronic hypercalcemia with prolonged hospital stay at OSH and subsequent transfer to [facility] on 02/11/22 for encephalopathy and FTT, now resolved, but remains inpatient pending placement/T19 process. #DispositionPending T-19 approval/placement. Her sister [name]) is her guardian. 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 Is currently on maintenance RA medications, will manage conservatively w/ ice packs, lidocaine. If no improvement or worsening,  will re-engage Rheumatology. Low concern for gout/pseudogout, septic joint- Rheumatology consulted, appreciate recs- C/w hydroxychloroquine 200mg  qd- Prednisone 4mg  qd, plan to taper 1mg  reduction per week, next change 8/12- Voltaren gel, Tylenol, lidocaine patch, PRN tramadol, ice packs prn- cont gabapentin 300mg  TID - Continue PT#Hypercalcemia, now normalizedStable Ca at 9.9 (8/7) Alb 3.7. Previously seen by Endocrine. ?2/2 to prolonged immobility vs primary hyperparathyroidism. 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

## 2022-07-31 NOTE — Plan of Care
Danielle [name]					Location: EP [id number] y.o., female				Attending: Hulda Marin, MD	Admit Date: 02/11/2022			[name] LOS: 170 days FOLLOW-UP NUTRITION ASSESSMENT Dietary Orders (From admission, onward)     Start     Ordered  07/25/22 1408  Nutrition Supplements  (Nutrition Supplement Panel)  Once      Question Answer Comment Adult Supplements: Ensure Clear Danielle [name] [facility])  Supplement Frequency: Daily    07/25/22 1407  06/20/22 1045  Diet Regular  DIET EFFECTIVE NOW      Question:  Initiate Nutrition Management Protocol (Yes/No?)  Answer:  Yes - Initiate Protocol  06/20/22 1045    No Known AllergiesANTHROPOMETRICS:Height:?70Admit wt:?79.4?kg?(per SNF documentation)Current wt: 74.1?kg (bed, 8/8); BMI: 23.44?kg/m?Dosing weight:?73.5?kg?(based on?wt taken 7/4)??Additional wt information:Ht confirmed?with pt. No wt hx since 2019 per EMR. Wts have fluctuated since admit, likely r/t scale use vs fluid shifts vs some true wt change. Current wt down?5.3?kg (7%) from admit wt x 5 months, not significant for timeframe. Noted 1-2+ edema per RN flowsheets. Moderate muscle wasting visualized in clavicle and temple region. Moderate fat wasting visualized in buccal and orbital regions.?Will continue?utilize 73.5 kg as dosing wt d/t frequent fluctuations over admission. Will monitor trends?as available.?? ? Wt: 06/24/22 73.5 kg 12/13/18 98.6 kg ?ESTIMATED NUTRITION REQUIREMENTS:Kcal/day:?1838???????????(25?kcal/kg)Protein/day:?74-88?????(1-1.2?gm/kg)Fluids/day:?1838?mL/d?(1 mL/kcal) ONLY as medical and fluid status allows and only per team discretion.Needs based on:?dosing wt (73.5 kg), maintenance, steroid?NUTRITION ASSESSMENT:?PT EMR reviewed for follow up assessment. Pt met and reports stable po intake over the past week and continues to voice menu fatigue. Drinking Ensure clear once daily. Per pt recall she has been eating about 75% of meals which is also noted in nursing flow sheets to have between 75-100% of meal completed. Pt endorses focusing on other foods high in protein to meet nutrition targets. Pt likely meeting nutrition targets, but would benefit from continued encouragement to consume high protein meals/snacks including Ensure Clear. ?Cultural/Religious/Ethnic Needs:?NoFood Allergies: None per pt?NUTRITION DIAGNOSIS:Severe malnutrition r/t depletion aeb moderate muscle and fat depletion.?-- improving, continues?INTERVENTIONS/RECOMMENDATIONS:?Meals and Snacks:- Continue?Regular diet- Encourage small frequent meals/snacks- Encourage high kcal/protein foods?Goal:- Pt will consume >/=1 protein rich food with each meal-  met,?will continue?Nutrition Supplement Therapy:-?Will adjust order to Ensure Clear Berry?daily per pt request??Goal:?- Pt to consume 1 Ensure Clear daily?-?met, continue ?Discharge Planning and Transfer of Nutrition Care:??Nutrition related discharge needs still being determined at this time, will continue to follow?MONITORING / EVALUATION:?Food/Nutrition-Related History:Food and Nutrient Intake?Anthropometric Measurements:Weight ?Biochemical Data, Medical Tests and Procedures:Electrolyte and renal profileGlucose/endocrine profile?Nutrition-Focus Physical FindingsOverall findingsElectronically signed by Danielle [name], RD, August 10, 2023MHB- [phone] Please note: Nutrition is a consult only service on weekends and holidays.  Please enter a consult in EPIC if assistance is needed on a weekend or holiday or via [facility] [phone] to contact the covering RD.

## 2022-07-31 NOTE — Plan of Care
Plan of Care Overview/ Patient Status    A&Ox4, forgetful at times. Ostomy with moderate output. Incontinent of urine, care provided. PO intake good. T&P q 2hrs, heels off loaded with podus boot to left pillows on right. Safety maintained, bed alarm on, call bell in reach

## 2022-07-31 NOTE — Plan of Care
Plan of Care Overview/ Patient Status    1900 - 0700 A&Ox4. Resting in bed overnight. Initially denied pain at rest, podus boot applied to L ft, pain became moderate after ~3hrs, boot removed, scheduled analgesics and PRN 25mg  tramadol continued. Appeared to rest comfortably overnight. No new issues or concerns this shift. Appeared to rest comfortably overnight. Continues to await T19. Hourly rounding performed and safety maintained. Please continue to follow POC. See flow sheets for complete assessment. Problem: Adult Inpatient Plan of CareGoal: Plan of Care ReviewOutcome: Interventions implemented as appropriateFlowsheets (Taken 07/30/2022 2221)Progress: no changePlan of Care Reviewed With: patient

## 2022-08-01 MED ORDER — PREDNISONE 1 MG TABLET
1 mg | Freq: Every day | ORAL | Status: DC
Start: 2022-08-01 — End: 2022-08-12
  Administered 2022-08-02 – 2022-08-12 (×11): 1 mg via ORAL

## 2022-08-01 NOTE — Plan of Care
Inpatient Physical Therapy Progress Note IP Adult PT Eval/Treat - 08/01/22 1015    Date of Visit / Treatment  Date of Visit / Treatment 08/01/22   Note Type Progress Note   Progress Report Due 09/24/22   End Time 1015    General Information  Subjective [place]   General Observations Pt received supine in bed on EP 9-7; on RA; NAD; ostomy; purwick; L podus boot bedside; RN premedicated and cleared   Precautions/Limitations Fall Precautions;Bed alarm;Chair alarm   Precautions/Limitations Comment Aspiration Precaution    Vital Signs and Orthostatic Vital Signs  Vital Signs Vital Signs Stable   Vital Signs Free text on RA, NAD    Pain/Comfort  Pain Comment (Pre/Post Treatment Pain) Pt c/o 8/10 L posterior knee pain w/ mobility; RN aware and repositioned for comfort    Patient Coping  Observed Emotional State accepting;cooperative   Verbalized Emotional State acceptance    Cognition  Overall Cognitive Status WFL   Orientation Level Oriented X4   Level of Consciousness alert   Following Commands Follows one step commands without difficulty   Personal Safety / Judgment Fall risk;Requires supervision with mobility   Cognition Comments Pleasant; can be forgetful at times    Range of Motion  Range of Motion Comments Limited terminal hip/knee extension; developing B ankle PF contractures (L>R); limited shoulder elevation (R>L)    Manual Muscle Testing  Manual Muscle Testing Comments + Deconditioned    Musculoskeletal  LUE Muscle Strength Grading 3-->active movement against gravity   RUE Muscle Strength Grading --   3-/5  LLE Muscle Strength Grading 2-->active movement with gravity eliminated   Ankle: 1/5  RLE Muscle Strength Grading --   3-/5   Skin Assessment  Skin Assessment See Nursing Documentation    Posture, Head/Trunk Alignment  Posture, Head/Trunk Alignment Retropulsive;Right lateral lean    Balance Sitting Balance: Static  FAIR-      Contact Guard to maintain static position with no Assistive Device   Sitting Balance: Dynamic  POOR+   Moves through 1/2 range with minimal assist to right self   Balance Assist Device Hand held assist   Balance Skills Training Comment Ax1    Bed Mobility  Supine-to-Sit Independence/Assistance Level Maximum assist;Assist of 1   Supine-to-Sit Assist Device Bed rails;Hand held assist;Head of bed elevated   Sit-to-Supine Independence/Assistance Level Maximum assist;Assist of 1   Sit-to-Supine Assist Device Bed rails;Hand held assist;Head of bed elevated   Bed Mobility, Impairments ROM decreased;strength decreased;impaired balance;pain   Bed Mobility Comments Assist to trunk and BLE supine<>sit; noted R lateral lean and retropulsive requiring cues to prevent LOB; tolerates seated therex well    Sit-Stand Transfer Training  Sit-Stand Transfer Comments Deferred d/t pain, weakness, and incr need for skilled assist    Handoff Documentation  Handoff Patient in bed;Bed alarm;Patient instructed to call nursing for mobility;Discussed with nursing   Handoff Comments L podus boot donned    Endurance  Endurance Comments Fair-    PT- AM-PAC - Basic Mobility Screen- How much help from another person do you currently need.....  Turning from your back to your side while in a a flat bed without using rails? 2 - A Lot - Requires a lot of help (maximum to moderate assistance). Can use assistive devices.   Moving from lying on your back to sitting on the side of a flat bed without using bed rails? 1 - Total - Requires total assistance or cannot do it at all.   Moving  to and from a bed to a chair (including a wheelchair)? 1 - Total - Requires total assistance or cannot do it at all.   Standing up from a chair using your arms(e.g., wheelchair or bedside chair)? 1 - Total - Requires total assistance or cannot do it at all.   To walk in a hospital room? 1 - Total - Requires total assistance or cannot do it at all.   Climbing 3-5 steps with a railing? 1 - Total - Requires total assistance or cannot do it at all.   AMPAC Mobility Score 7   TARGET Highest Level of Mobility Mobility Level 2, Turn self in bed/bed activity/dependent transfer    ACTUAL Highest Level of Mobility Mobility Level 3, Sit on edge of bed    Therapeutic Exercise  Therapeutic Exercise Comments Reviewed and encouraged AROMX4; PROM RUE    Neuromuscular Re-education  Neuromuscular Re-education Comments Seated static/dynamic PC training    Therapeutic Functional Activity  Therapeutic Functional Activity Comments Bed mobility; Pt edu    Orthotic Fabrication/Training  Orthotics Fabrication/Training Comments L podus boot    Clinical Impression  Follow up Assessment Pt seen for PT f/u. Required maxAx1 for supine <> sit. Noted R lateral lean and retropulsive requiring cues to prevent LOB. Tolerates seated therex well. Will benefit from skilled services to improve gross functional ROM/strength, postural control, and activity tolerance. Current PT d/c rec is SNF for LTC.   Criteria for Skilled Therapeutic Interventions Met yes    Patient/Family Stated Goals  Patient/Family Stated Goal(s) return to prior level of functioning;decrease pain;feel better    Frequency/Equipment Recommendations  PT Frequency 2x per week   What day of week is next treatment expected? Monday   8/14  PT/PTA completing this assessment RL   Equipment Needs During Admission/Treatment Hospital bed;Hoyer lift    PT Recommendations for Inpatient Admission  Activity/Level of Assist dangle edge of bed;assist of 1   ADL Recommendations assist of 2;mechanical lift   Positioning chair position of the bed;reposition frequently   Therapeutic Exercise ROM as tolerated   Other/Comments Pillow between knees in supine for contracture management and prevention of skin breakdown; encourage B knee extension in supine; encourage B podus boots or shorten bed or foam to maintain 0 deg of ankle DF    Planned Treatment / Interventions  Plan for Next Visit Progress as tol   Training Treatment / Interventions Balance / Investment banker, operational;Endurance Training;Functional Personnel officer / Interventions Patient Education / Training    PT Discharge Summary  Physical Therapy Disposition Recommendation SNF for long term placement     Shanon Payor PT, DPTMHB: [phone]

## 2022-08-01 NOTE — Plan of Care
Plan of Care Overview/ Patient Status    1900 - 0700 A&Ox4. Resting in bed overnight. Continues to report pain to L ankle w/activity/repositioning, scheduled analgesics and PRN 25mg  tramadol given. Encouraged to T&P, pt often refuses, education provided. No acute issues or concerns overnight. T19 pending. Hourly rounding performed and safety maintained. Please continue to follow POC. See flow sheets for complete assessment. Problem: Adult Inpatient Plan of CareGoal: Plan of Care ReviewOutcome: Interventions implemented as appropriateFlowsheetsTaken 08/01/2022 0513Progress: no changeTaken 07/30/2022 2221Plan of Care Reviewed With: patient

## 2022-08-02 NOTE — Progress Notes
[facility]	Internal Medicine Progress NoteAttending Provider: Hulda [name], MDSubjective: Interim Events:- No acute events overnight.- Received one dose of 25 mg tramadol overnight for left leg pain.Subjective AM:- Reports feeling well this morning. Her left ankle was in the brace due to ongoing pain, which is overall stable today.- Slept well overnight. No chest pain, shortness of breath or abdominal pain.Medications: Current Facility-Administered Medications Medication Dose Route Frequency Provider Last Rate Last Admin ? diclofenac (VOLTAREN) 1 % gel 4 g  4 g Topical (Top) 4x Daily Danielle Daughters, MD   4 g at 08/02/22 0946 ? enoxaparin (LOVENOX) injection 80 mg  80 mg Subcutaneous Q12H Danielle [name], Bushra, MD   80 mg at 08/02/22 0942 ? famotidine (PEPCID) tablet 20 mg  20 mg Oral Daily Danielle Lange, MD   20 mg at 08/02/22 5409 ? ferrous gluconate (FERGON) tablet 324 mg  324 mg Oral Every Other Day Danielle Daughters, MD   324 mg at 08/02/22 0942 ? gabapentin (NEURONTIN) capsule 300 mg  300 mg Oral TID Danielle Coup, MD   300 mg at 08/02/22 8119 ? hydrOXYchloroQUINE (PLAQUENIL) tablet 200 mg  200 mg Oral Daily Danielle Relic, MD   200 mg at 08/02/22 1478 ? lidocaine 4 % topical patch 1 patch  1 patch Transdermal Q24H Danielle Relic, MD   1 patch at 07/30/22 1507 ? melatonin tablet 3 mg  3 mg Oral Nightly Danielle Ochoa, MD   3 mg at 08/01/22 2104 ? nystatin (MYCOSTATIN) powder 1 Application  1 Application  Topical (Top) BID Danielle Combs, MD   1 Application  at 08/02/22 [phone] ? predniSONE (DELTASONE) tablet 3 mg  3 mg Oral Daily Danielle Coup, MD   3 mg at 08/02/22 2130 ? rosuvastatin (CRESTOR) tablet 10 mg  10 mg Per G Tube Nightly Danielle Frames, MD   10 mg at 08/01/22 2104 QMV:HQIONGEXBMWUX, dextrose (GLUCOSE) oral liquid 15 g **OR** fruit juice **OR** skim milk, dextrose (GLUCOSE) oral liquid 30 g **OR** fruit juice, dextrose injection, dextrose injection, Lidocaine Patch, traMADoLObjective: Vitals:Temp:  [97.5 ?F (36.4 ?C)-97.8 ?F (36.6 ?C)] 97.5 ?F (36.4 ?C)Pulse:  [56-70] 64Resp:  [16-20] 16BP: (100-111)/(66-67) 102/66SpO2:  [98 %-99 %] 99 %Device (Oxygen Therapy): room air  I/O's:Intake/Output Summary (Last 24 hours) at 08/02/2022 1100Last data filed at 08/02/2022 0708Gross per 24 hour Intake -- Output 550 ml Net -550 ml  Physical Exam:Gen: pleasant, well appearing female, laying comfortably in bed in no acute distress. Required significant assistance in transition from laying to sitting.HEENT: anicteric sclera, normal conjunctiva, EOMICV: RRR, no m/r/gPulm: clear to auscultation bilaterallyAbd: soft, nontender, nondistended with positive bowel sounds, colostomy bag in placeExt: L ankle brace in place, 1+ LLE edema, no RLE edema, extremities warm and well perfusedNeuro: A&Ox4, responded to questions appropriatelySkin: no rashes or lesions notedLabs: Labs: Recent Labs Lab 08/07/230655 WBC 4.9 HGB 11.5* HCT 36.40 PLT 289  Recent Labs Lab 08/07/230655 NEUTROPHILS 45.8  Recent Labs Lab 08/07/230655 NA 140 K 3.9 CL 110* CO2 22 BUN 14 CREATININE 0.40 GLU 88 ANIONGAP 8  Recent Labs Lab 08/07/230655 CALCIUM 9.9  No results for input(s): ALT, AST, ALKPHOS, BILITOT, BILIDIR in the last 168 hours.No results for input(s): ALBUMIN in the last 168 hours. No results for input(s): PTT, LABPROT, INR in the last 168 hours.No results for input(s): LACTATE in the last 168 hours. Recent Labs Lab 08/07/230655 GLU 88  No results for input(s): TROPONINT in the last 168 hours. Diagnostics:No new results.Assessment & Plan: Danielle [name] is a 66  yo conserved woman with a history of RA, multiple abdominal surgeries, chronic hypercalcemia with prolonged hospital stay at OSH and subsequent transfer to [name] on [facility]22 for encephalopathy and FTT, now resolved, but remains inpatient pending placement/T19 process. ?#DispositionPending T-19 approval/placement. Her sister Danielle [name]) is her guardian. Spend down is now complete, waiting for Medicaid qualification, LTC - Appreciate SW/CM assistance?#Rheumatoid arthritis#Osteoarthritis#Likely secondary Sjogren's#Immobility, contractures?Left knee w/o pain, better w/ tramadol overnight. Is currently on maintenance RA medications, will manage conservatively w/ ice packs, lidocaine. If no improvement or worsening, will re-engage Rheumatology. Low concern for gout/pseudogout, septic joint- Rheumatology consulted, appreciate recs- C/w hydroxychloroquine 200mg  qd- Prednisone 3 mg daily, tapered from 4 mg on 8/12- Voltaren gel, Tylenol, lidocaine patch, PRN tramadol, ice packs prn- cont gabapentin 300mg  TID - Continue PT?#Hypercalcemia, resolvedStable Ca at 9.9 (8/7) Alb 3.7. Previously seen by Endocrine, 2/2 to prolonged immobility vs primary hyperparathyroidism. 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 Providence [name], with an addendum to follow.Signed:Angellina Ferdinand Edman Circle, MD, [facility] Internal Medicine, PGY-3Available on MHBAugust 12, 2023

## 2022-08-02 NOTE — Progress Notes
[facility]	Medicine Progress NoteAttending Provider: Hulda Marin, MDDate of Admission: 2/21/2023Interim Events: Overnight: NAEOToday: - Will consider going to [facility] today- Has intermittent L knee pain, better with gabapentin and tramadol- Had L leg brace on for 7 hours yesterday, will remember to remove earlierMeds: Scheduled Meds:Current Facility-Administered Medications Medication Dose Route Frequency Provider Last Rate Last Admin ? diclofenac (VOLTAREN) 1 % gel 4 g  4 g Topical (Top) 4x Daily Larna Daughters, MD   4 g at 08/01/22 0943 ? enoxaparin (LOVENOX) injection 80 mg  80 mg Subcutaneous Q12H Abid, Bushra, MD   80 mg at 08/01/22 0943 ? famotidine (PEPCID) tablet 20 mg  20 mg Oral Daily Senaida Lange, MD   20 mg at 08/01/22 0943 ? ferrous gluconate (FERGON) tablet 324 mg  324 mg Oral Every Other Day Larna Daughters, MD   324 mg at 07/31/22 0929 ? gabapentin (NEURONTIN) capsule 300 mg  300 mg Oral TID Jonna Coup, MD   300 mg at 08/01/22 1610 ? hydrOXYchloroQUINE (PLAQUENIL) tablet 200 mg  200 mg Oral Daily Babette Relic, MD   200 mg at 08/01/22 0943 ? lidocaine 4 % topical patch 1 patch  1 patch Transdermal Q24H Babette Relic, MD   1 patch at 07/30/22 1507 ? melatonin tablet 3 mg  3 mg Oral Nightly Pryor Ochoa, MD   3 mg at 07/31/22 2228 ? nystatin (MYCOSTATIN) powder 1 Application  1 Application  Topical (Top) BID Henriette Combs, MD   1 Application  at 08/01/22 [phone] ? [START ON 08/02/2022] predniSONE (DELTASONE) tablet 3 mg  3 mg Oral Daily Jonna Coup, MD     ? rosuvastatin (CRESTOR) tablet 10 mg  10 mg Per G Tube Nightly Dorisann Frames, MD   10 mg at 07/31/22 2228 Continuous Infusions:PRN Meds:acetaminophen, dextrose (GLUCOSE) oral liquid 15 g **OR** fruit juice **OR** skim milk, dextrose (GLUCOSE) oral liquid 30 g **OR** fruit juice, dextrose injection, dextrose injection, Lidocaine Patch, traMADoLObjective: Vitals: Temp:  [97.7 ?F (36.5 ?C)] 97.7 ?F (36.5 ?C)Pulse:  [56] 56Resp:  [20] 20BP: (92)/(62) 92/62SpO2:  [99 %] 99 %Device (Oxygen Therapy): room air Weight: 74.1 kg I/O's: Intake/Output Summary (Last 24 hours) at 08/01/2022 1311Last data filed at 08/01/2022 0200Gross per 24 hour Intake -- Output 925 ml Net -925 ml  Physical Exam:General: Laying in bed in NADPulmonary: CTAB, no W/R/RCardiac: RRR, no M/R/GAbdomen: Soft, NT, ND, colostomy bag in place with flatus and formed stool, gauze pads over abdominal fistula dated 8/11 w/o drainageNeuro: Pleasant, calm, answering questions appropriatelyExtremities: Contracted left foot/ankle. L knee warm w/o effusion or tenderness to palpation Labs: MetabolicRecent Labs Lab 08/07/230655 NA 140 K 3.9 CL 110* CO2 22 BUN 14 CREATININE 0.40 ANIONGAP 8 CALCIUM 9.9  HematologicRecent Labs Lab 08/07/230655 WBC 4.9 HGB 11.5* HCT 36.40 PLT 289 NEUTROPHILS 45.8  HepaticNo results for input(s): BILITOT, BILIDIR, ALT, AST, ALKPHOS, PROT, ALBUMIN, PTT, LABPROT, INR in the last 168 hours. SpecialNo results for input(s): URICACID, LDH, FIBRINOGEN, DDIMER, FERRITIN, CRP in the last 168 hours.Invalid input(s): ESR Assessment & Plan: Ms. Smieja is a 66 yo conserved woman w/ PMH RA, multiple abdominal surgeries, chronic hypercalcemia with prolonged hospital stay at OSH and subsequent transfer to [facility] on 02/11/22 for encephalopathy and FTT, now resolved, but remains inpatient pending placement/T19 process. #DispositionPending T-19 approval/placement. Her sister [name]) is her guardian. 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  Is currently on maintenance RA medications, will manage conservatively w/ ice packs, lidocaine. If no improvement or worsening, will re-engage Rheumatology. Low concern for gout/pseudogout, septic joint- Rheumatology consulted, appreciate recs- C/w hydroxychloroquine 200mg  qd- Prednisone 4mg  qd, plan to taper 1mg  reduction to 3mg  tomorrow (8/12)- Voltaren gel, Tylenol, lidocaine patch, PRN tramadol, ice packs prn- cont gabapentin 300mg  TID - Continue PT#Hypercalcemia, now normalizedStable Ca at 9.9 (8/7) Alb 3.7. Previously seen by Endocrine. ?2/2 to prolonged immobility vs primary hyperparathyroidism. 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

## 2022-08-02 NOTE — Plan of Care
Plan of Care Overview/ Patient Status    1900-0700A&Ox4. VSS on RA. C/o left leg pain, prn tramadol given with positive effect. On regular diet, pill whole. Ostomy bag in place, bag changed. Incontinent of bladder, purewick in place. Ax2 in bed, T&R q2h provided. Bed alarm on, call bell within reach, frequent rounding done, safety maintained.

## 2022-08-02 NOTE — Plan of Care
Plan of Care Overview/ Patient Status    0700-1900Pt is pleasant, cooperative, aox4, vss on room air.C/o mild pain in L ankle , controlled with lidocaine patch and voltaren cream. Wore ortho foot for 4-5 hrs today. Tolerating regular diet, takes pills whole. Ostomy appliance changed.Inc care provided, purewick in place draining clear yellow urine.Bed locked in lowest position, bed alarm on, call bell within reach, pt calls appropriately for assistance.

## 2022-08-03 MED ORDER — FLUTICASONE PROPIONATE 50 MCG/ACTUATION NASAL SPRAY,SUSPENSION
50 mcg/actuation | Freq: Every day | NASAL | Status: DC
Start: 2022-08-03 — End: 2022-08-15
  Administered 2022-08-04 – 2022-08-14 (×12): 50 mcg/actuation via NASAL

## 2022-08-03 NOTE — Plan of Care
Plan of Care Overview/ Patient Status    1900-0700A&Ox4. VSS on RA. Diclofenac cream applied for ankle and heal pain with positive effect. On regular diet, pill whole. Ostomy bag in place. Incontinent of bladder, purewick utilized.  Ax2 in bed, T&R q2h provided. Bed alarm on, call bell within reach, frequent rounding done, safety maintained.?

## 2022-08-03 NOTE — Plan of Care
Plan of Care Overview/ Patient Status    0700-1900A&O:x4Cardiac: WDLResp: room air, denies SOB/DOEGI/GU: colostomy. Incontinent of urine, purwick in placeDiet: regular Skin: intactPain: left ankle pain, see MAR for interventions Mobility: total Access: none Pills: whole Safety maintained, call bell in reach, bed alarm set, rounding continued. See flowsheets for full assessment.

## 2022-08-04 LAB — CBC WITH AUTO DIFFERENTIAL
BKR WAM ABSOLUTE IMMATURE GRANULOCYTES.: 0.02 x 1000/ÂµL (ref 0.00–0.30)
BKR WAM ABSOLUTE LYMPHOCYTE COUNT.: 2.02 x 1000/ÂµL (ref 0.60–3.70)
BKR WAM ABSOLUTE NRBC (2 DEC): 0 x 1000/ÂµL (ref 0.00–1.00)
BKR WAM ANALYZER ANC: 2.45 x 1000/ÂµL (ref 2.00–7.60)
BKR WAM BASOPHIL ABSOLUTE COUNT.: 0.05 x 1000/ÂµL (ref 0.00–1.00)
BKR WAM BASOPHILS: 0.9 % (ref 0.0–1.4)
BKR WAM EOSINOPHIL ABSOLUTE COUNT.: 0.3 x 1000/ÂµL (ref 0.00–1.00)
BKR WAM EOSINOPHILS: 5.5 % — ABNORMAL HIGH (ref 0.0–5.0)
BKR WAM HEMATOCRIT (2 DEC): 36.1 % (ref 35.00–45.00)
BKR WAM HEMOGLOBIN: 11.5 g/dL — ABNORMAL LOW (ref 11.7–15.5)
BKR WAM IMMATURE GRANULOCYTES: 0.4 % (ref 0.0–1.0)
BKR WAM LYMPHOCYTES: 36.9 % (ref 17.0–50.0)
BKR WAM MCH (PG): 28.9 pg (ref 27.0–33.0)
BKR WAM MCHC: 31.9 g/dL (ref 31.0–36.0)
BKR WAM MCV: 90.7 fL (ref 80.0–100.0)
BKR WAM MONOCYTE ABSOLUTE COUNT.: 0.63 x 1000/ÂµL (ref 0.00–1.00)
BKR WAM MONOCYTES: 11.5 % (ref 4.0–12.0)
BKR WAM MPV: 10.2 fL (ref 8.0–12.0)
BKR WAM NEUTROPHILS: 44.8 % (ref 39.0–72.0)
BKR WAM NUCLEATED RED BLOOD CELLS: 0 % (ref 0.0–1.0)
BKR WAM PLATELETS: 259 x1000/ÂµL (ref 150–420)
BKR WAM RDW-CV: 16.8 % — ABNORMAL HIGH (ref 11.0–15.0)
BKR WAM RED BLOOD CELL COUNT.: 3.98 M/ÂµL — ABNORMAL LOW (ref 4.00–6.00)
BKR WAM WHITE BLOOD CELL COUNT: 5.5 x1000/ÂµL (ref 4.0–11.0)

## 2022-08-04 LAB — BASIC METABOLIC PANEL
BKR ANION GAP: 9 (ref 7–17)
BKR BLOOD UREA NITROGEN: 13 mg/dL (ref 8–23)
BKR BUN / CREAT RATIO: 33.3 — ABNORMAL HIGH (ref 8.0–23.0)
BKR CALCIUM: 10.3 mg/dL — ABNORMAL HIGH (ref 8.8–10.2)
BKR CHLORIDE: 110 mmol/L — ABNORMAL HIGH (ref 98–107)
BKR CO2: 23 mmol/L (ref 20–30)
BKR CREATININE: 0.39 mg/dL — ABNORMAL LOW (ref 0.40–1.30)
BKR EGFR, CREATININE (CKD-EPI 2021): >60 mL/min/{1.73_m2} (ref >=60)
BKR GLUCOSE: 91 mg/dL (ref 70–100)
BKR POTASSIUM: 4.1 mmol/L (ref 3.3–5.3)
BKR SODIUM: 142 mmol/L (ref 136–144)

## 2022-08-04 NOTE — Progress Notes
[facility]	Internal Medicine Progress NoteAttending Provider: Hulda Marin, MDSubjective: Interim Events:- No acute events overnight.Subjective AM:- Well this morning- No new painMedications: Current Facility-Administered Medications Medication Dose Route Frequency Provider Last Rate Last Admin ? diclofenac (VOLTAREN) 1 % gel 4 g  4 g Topical (Top) 4x Daily Soleymanjahi, Saeed, MD   4 g at 08/02/22 2312 ? enoxaparin (LOVENOX) injection 80 mg  80 mg Subcutaneous Q12H Abid, Bushra, MD   80 mg at 08/02/22 2111 ? famotidine (PEPCID) tablet 20 mg  20 mg Oral Daily Senaida Lange, MD   20 mg at 08/02/22 0942 ? ferrous gluconate (FERGON) tablet 324 mg  324 mg Oral Every Other Day Larna Daughters, MD   324 mg at 08/02/22 0942 ? gabapentin (NEURONTIN) capsule 300 mg  300 mg Oral TID Jonna Coup, MD   300 mg at 08/02/22 2112 ? hydrOXYchloroQUINE (PLAQUENIL) tablet 200 mg  200 mg Oral Daily Babette Relic, MD   200 mg at 08/02/22 1610 ? lidocaine 4 % topical patch 1 patch  1 patch Transdermal Q24H Babette Relic, MD   1 patch at 08/02/22 1428 ? melatonin tablet 3 mg  3 mg Oral Nightly Pryor Ochoa, MD   3 mg at 08/02/22 2111 ? nystatin (MYCOSTATIN) powder 1 Application  1 Application  Topical (Top) BID Henriette Combs, MD   1 Application  at 08/02/22 2112 ? predniSONE (DELTASONE) tablet 3 mg  3 mg Oral Daily Jonna Coup, MD   3 mg at 08/02/22 9604 ? rosuvastatin (CRESTOR) tablet 10 mg  10 mg Per G Tube Nightly Dorisann Frames, MD   10 mg at 08/02/22 2111 VWU:[id number], dextrose (GLUCOSE) oral liquid 15 g **OR** fruit juice **OR** skim milk, dextrose (GLUCOSE) oral liquid 30 g **OR** fruit juice, dextrose injection, dextrose injection, Lidocaine Patch, traMADoLObjective: Vitals:Temp:  [97.5 ?F (36.4 ?C)-97.9 ?F (36.6 ?C)] 97.9 ?F (36.6 ?C)Pulse:  [64-73] 67Resp:  [16] 16BP: (94-102)/(57-66) 96/60SpO2: [96 %-99 %] 96 %Device (Oxygen Therapy): room air  I/O's:Intake/Output Summary (Last 24 hours) at 08/03/2022 0619Last data filed at 08/02/2022 2125Gross per 24 hour Intake 360 ml Output 1100 ml Net -740 ml  Physical Exam:Gen: pleasant, well appearing female, laying comfortably in bed in no acute distress. Required significant assistance in transition from laying to sitting.HEENT: anicteric sclera, normal conjunctiva, EOMICV: RRR, no m/r/gPulm: clear to auscultation bilaterallyAbd: soft, nontender, nondistended with positive bowel sounds, colostomy bag in placeExt: Contracted L LE w/o brace, 1+ LLE edema, no RLE edema, extremities warm and well perfusedNeuro: A&Ox4, responded to questions appropriatelySkin: no rashes or lesions notedLabs: Labs: Recent Labs Lab 08/07/230655 WBC 4.9 HGB 11.5* HCT 36.40 PLT 289  Recent Labs Lab 08/07/230655 NEUTROPHILS 45.8  Recent Labs Lab 08/07/230655 NA 140 K 3.9 CL 110* CO2 22 BUN 14 CREATININE 0.40 GLU 88 ANIONGAP 8  Recent Labs Lab 08/07/230655 CALCIUM 9.9  No results for input(s): ALT, AST, ALKPHOS, BILITOT, BILIDIR in the last 168 hours.No results for input(s): ALBUMIN in the last 168 hours. No results for input(s): PTT, LABPROT, INR in the last 168 hours.No results for input(s): LACTATE in the last 168 hours. Recent Labs Lab 08/07/230655 GLU 88  No results for input(s): TROPONINT in the last 168 hours. Diagnostics:No new results.Assessment & Plan: [name] is a 66 yo conserved woman with a history of RA, multiple abdominal surgeries, chronic hypercalcemia with prolonged hospital stay at OSH and subsequent transfer to [facility] on 02/11/22 for encephalopathy and FTT, now resolved, but remains inpatient pending placement/T19 process. ?#DispositionPending  T-19 approval/placement. Her sister [name]) is her guardian. Spend down is now complete, waiting for Medicaid qualification, LTC - Appreciate SW/CM assistance?#Rheumatoid arthritis#Osteoarthritis#Likely secondary Sjogren's#Immobility, contractures?Left knee w/o pain, better w/ tramadol overnight. Is currently on maintenance RA medications, will manage conservatively w/ ice packs, lidocaine. If no improvement or worsening, will re-engage Rheumatology. Low concern for gout/pseudogout, septic joint- Rheumatology consulted, appreciate recs- C/w hydroxychloroquine 200mg  qd- Prednisone 3 mg daily, tapered from 4 mg on 8/12. Next taper 8/19- Voltaren gel, Tylenol, lidocaine patch, PRN tramadol, ice packs prn- cont gabapentin 300mg  TID - Continue PT?#Hypercalcemia, resolvedStable Ca at 9.9 (8/7) Alb 3.7. Previously seen by Endocrine, 2/2 to prolonged immobility vs primary hyperparathyroidism. 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 Providence Lanius, with an addendum to follow.Signed:Japheth Diekman Modesto Charon, [facility] Internal Medicine, PGY1Available on MHBAugust 13, 2023

## 2022-08-04 NOTE — Plan of Care
Plan of Care Overview/ Patient Status    1900 - 0700 A&Ox4, though does appear sleepier than usual, pt also endorsing tiredness. Resting in bed overnight. Moderate/severe pain to LLE continues w/activity, reports none at rest, scheduled analgesics continued. Ostomy intact. T&P'd as tolerated, pt often refused, education provided. No acute issues or concerns this shift. Appeared to rest comfortably overnight. T19 process ongoing. Hourly rounding performed and safety maintained. Please continue to follow POC. See flow sheets for complete assessment. Problem: Adult Inpatient Plan of CareGoal: Plan of Care ReviewOutcome: Interventions implemented as appropriateFlowsheets (Taken 08/04/2022 0020)Progress: no changePlan of Care Reviewed With: patient

## 2022-08-05 NOTE — Plan of Care
Plan of Care Overview/ Patient Status    Pt AAox4, offered to go back to bed and pt stated she would like to call when she is ready to be moved back into bed. Call bell in reach.

## 2022-08-05 NOTE — Plan of Care
Plan of Care Overview/ Patient Status    This department continues to follow pts progress. Inquiring info from SW re T19 status update. PT/OT recs SNF for LTP, AMPAC 7. Will follow.Darrick Meigs RNFloat Care [phone]

## 2022-08-05 NOTE — Plan of Care
Plan of Care Overview/ Patient Status    Pt was transferred from the bed to the chair via hoyer lift. Assist of 2x. Pt unable to stand and pivot with assistance of 2, and walker. Pt able to stand to transfer. Call bed in reach and pt resting. RN Bev notified.

## 2022-08-05 NOTE — Progress Notes
[facility] Progress NoteSubjective: Hospital Day: 174No acute events overnightFeeling well, no complaintsStates her sister and brother have visited herIs hopeful for the state to process her application soonOpen to getting a chair at bedside and sitting up in itReview of Allergies/Meds/Hx: Allergies: Patient has no known allergies.Medications: Scheduled Meds:Current Facility-Administered Medications Medication Dose Route Frequency Provider Last Rate Last Admin ? diclofenac (VOLTAREN) 1 % gel 4 g  4 g Topical (Top) 4x Daily Larna Daughters, MD   4 g at 08/04/22 0936 ? enoxaparin (LOVENOX) injection 80 mg  80 mg Subcutaneous Q12H Abid, Bushra, MD   80 mg at 08/04/22 0935 ? famotidine (PEPCID) tablet 20 mg  20 mg Oral Daily Senaida Lange, MD   20 mg at 08/04/22 0936 ? ferrous gluconate (FERGON) tablet 324 mg  324 mg Oral Every Other Day Larna Daughters, MD   324 mg at 08/04/22 0936 ? fluticasone propionate (FLONASE) 50 mcg/actuation nasal spray 1 spray  1 spray each nostril Daily Jonna Coup, MD   1 spray at 08/04/22 1610 ? gabapentin (NEURONTIN) capsule 300 mg  300 mg Oral TID Jonna Coup, MD   300 mg at 08/04/22 9604 ? hydrOXYchloroQUINE (PLAQUENIL) tablet 200 mg  200 mg Oral Daily Babette Relic, MD   200 mg at 08/04/22 5409 ? lidocaine 4 % topical patch 1 patch  1 patch Transdermal Q24H Babette Relic, MD   1 patch at 08/03/22 1456 ? melatonin tablet 3 mg  3 mg Oral Nightly Pryor Ochoa, MD   3 mg at 08/03/22 2150 ? nystatin (MYCOSTATIN) powder 1 Application  1 Application  Topical (Top) BID Henriette Combs, MD   1 Application  at 08/04/22 [phone] ? predniSONE (DELTASONE) tablet 3 mg  3 mg Oral Daily Jonna Coup, MD   3 mg at 08/04/22 1478 ? rosuvastatin (CRESTOR) tablet 10 mg  10 mg Per G Tube Nightly Dorisann Frames, MD   10 mg at 08/03/22 2150 Continuous Infusions:PRN Meds:.acetaminophen, dextrose (GLUCOSE) oral liquid 15 g **OR** fruit juice **OR** skim milk, dextrose (GLUCOSE) oral liquid 30 g **OR** fruit juice, dextrose injection, dextrose injection, Lidocaine Patch, traMADoLObjective: Vitals:Temp:  [97.4 ?F (36.3 ?C)-98.6 ?F (37 ?C)] 97.4 ?F (36.3 ?C)Pulse:  [60-70] 60Resp:  [16-20] 20BP: (92-101)/(55-56) 101/55SpO2:  [95 %-99 %] 99 %Device (Oxygen Therapy): room airWeight change: I/O's:Gross Totals (Last 24 hours) at 08/04/2022 1429Last data filed at 08/03/2022 2147Intake 237 ml Output 700 ml Net -463 ml Physical Exam General: Well appearing in no acute distress, alert and oriented to person, place, time and situationCV: Normal rate and regular rhythm, no murmursLung: Clear to auscultation bilaterallyAbd: Soft, non-tender, non-distended. Normoactive bowel sounds. G-tube related fistula dressing in place. R sided ostomy bag in place.Ext: LLE minimal edema, RLE no edema, no rashesLabs: Recent Labs Lab 08/14/230533 WBC 5.5 HGB 11.5* HCT 36.10 PLT 259  Recent Labs Lab 08/14/230533 NEUTROPHILS 44.8  Recent Labs Lab 08/14/230533 NA 142 K 4.1 CL 110* CO2 23 BUN 13 CREATININE 0.39* GLU 91 ANIONGAP 9  Recent Labs Lab 08/14/230533 CALCIUM 10.3*  No results for input(s): ALT, AST, ALKPHOS, BILITOT, BILIDIR in the last 168 hours. No results for input(s): PTT, LABPROT, INR in the last 168 hours. Microbiology:No new data Diagnostics:XR Knee Left AP Lateral and ObliquesResult Date: 7/26/2023Study: XR KNEE LEFT AP LATERAL AND OBLIQUES.  Indication: New severe left knee pain, hx RA Prior: May 09, 2022. Findings: Significant joint space narrowing, subchondral sclerosis, subchondral cystic change and osteophyte formation is seen within the lateral compartment of the  left knee. Trace joint effusion is seen. Findings are similar to the prior exam. There is no acute fracture or dislocation. 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 Trace joint effusion. [facility] Radiology Notify System:  Routine Reported and signed by: Herbie Baltimore, MD  [facility] Radiology and Biomedical Imaging Assessment & Plan: ID: This is Danielle [name] a 66 y.o. female with history of RA, multiple abdominal surgeries, chronic hypercalcemia pending Title 19/long term placement.#DispositionPending state notification to start search for long term care. Appreciate SW assistance.#RA#Osteoarthritis#Immobility, contracturesPatient continues on hydroxychloroquine 200mg  daily for RA.- on prednisone 3mg  daily, plan to taper to 2mg  on 8/19- continue voltaren, tylenol, lido patches, ice packs PRN- also has PRN tramadol- gabapentin 300mg  TID for neuropathy- have asked nursing for assistance with getting pt to a chair => they have ordered a recliner#HypercalcemiaStable, seen by endocrine in the past, thought to be 2/2 to prolonged immobility vs primary hyperparathyroidism. 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 Robb Matar. Signed:[name], MDPGY3, Internal MedicineDate: 8/14/2023Time: 2:29 PM

## 2022-08-05 NOTE — Plan of Care
Plan of Care Overview/ Patient Status    [name]&Ox4, vss, complains of LLE pain, medications given as scheduled with good effect. PO intake good. Ostomy bag intact, positive for flatus and liquid , brown stool. OOB to chair this am. Tolerated well. T&P q 2hrs, heels off loaded . Safety maintained, bed alarm on, call bell in reach.

## 2022-08-05 NOTE — Plan of Care
Plan of Care Overview/ Patient Status    1900 - 0700 A&Ox4. Continues to appear sleepier than usual, pt also agrees w/this assessment. Resting in bed overnight. LLE pain continues, worsening w/activity, scheduled analgesics continued. Ostomy intact w/mushy stools/purewick in place, clear yellow urine. No acute issues or concerns overnight. Appeared to rest comfortably. T19 process in the works. Hourly rounding performed and safety maintained. Please continue to follow POC. See flow sheets for complete assessment. Problem: Adult Inpatient Plan of CareGoal: Plan of Care Review8/15/2023 0127 by Jones Skene, RNOutcome: Interventions implemented as appropriateFlowsheets (Taken 08/05/2022 0127)Progress: no changePlan of Care Reviewed With: patient8/15/2023 0126 by Jones Skene, RNReactivated8/15/2023 0125 by Jones Skene, RNOutcome: Outcome(s) achieved

## 2022-08-06 NOTE — Progress Notes
[facility] Hospital[facility] Progress NoteSubjective: Hospital Day: 176- No acute events overnight- Feeling well, no complaintsReview of Allergies/Meds/Hx: Allergies: Patient has no known allergies.Medications: Scheduled Meds:Current Facility-Administered Medications Medication Dose Route Frequency Provider Last Rate Last Admin ? diclofenac (VOLTAREN) 1 % gel 4 g  4 g Topical (Top) 4x Daily Larna Daughters, MD   4 g at 08/06/22 1241 ? enoxaparin (LOVENOX) injection 80 mg  80 mg Subcutaneous Q12H Abid, Bushra, MD   80 mg at 08/06/22 0950 ? famotidine (PEPCID) tablet 20 mg  20 mg Oral Daily Senaida Lange, MD   20 mg at 08/06/22 0951 ? ferrous gluconate (FERGON) tablet 324 mg  324 mg Oral Every Other Day Larna Daughters, MD   324 mg at 08/06/22 0951 ? fluticasone propionate (FLONASE) 50 mcg/actuation nasal spray 1 spray  1 spray each nostril Daily Jonna Coup, MD   1 spray at 08/06/22 1242 ? gabapentin (NEURONTIN) capsule 300 mg  300 mg Oral TID Jonna Coup, MD   300 mg at 08/06/22 1610 ? hydrOXYchloroQUINE (PLAQUENIL) tablet 200 mg  200 mg Oral Daily Babette Relic, MD   200 mg at 08/06/22 0951 ? lidocaine 4 % topical patch 1 patch  1 patch Transdermal Q24H Babette Relic, MD   1 patch at 08/05/22 1558 ? melatonin tablet 3 mg  3 mg Oral Nightly Pryor Ochoa, MD   3 mg at 08/03/22 2150 ? nystatin (MYCOSTATIN) powder 1 Application  1 Application  Topical (Top) BID Henriette Combs, MD   1 Application  at 08/06/22 1241 ? predniSONE (DELTASONE) tablet 3 mg  3 mg Oral Daily Jonna Coup, MD   3 mg at 08/06/22 9604 ? rosuvastatin (CRESTOR) tablet 10 mg  10 mg Per G Tube Nightly Dorisann Frames, MD   10 mg at 08/05/22 2035 Continuous Infusions:PRN Meds:.acetaminophen, dextrose (GLUCOSE) oral liquid 15 g **OR** fruit juice **OR** skim milk, dextrose (GLUCOSE) oral liquid 30 g **OR** fruit juice, dextrose injection, dextrose injection, traMADoLObjective: Vitals:Temp:  [97.4 ?F (36.3 ?C)-97.6 ?F (36.4 ?C)] 97.6 ?F (36.4 ?C)Pulse:  [61-76] 61Resp:  [16-18] 18BP: (86-100)/(58-65) 91/59SpO2:  [99 %] 99 %Device (Oxygen Therapy): room airWeight change: I/O's:Gross Totals (Last 24 hours) at 08/06/2022 1415Last data filed at 08/06/2022 1100Intake -- Output 1350 ml Net -1350 ml Physical Exam  unchangedGeneral: Well appearing in no acute distress, alert and oriented to person, place, time and situationCV: Normal rate and regular rhythm, no murmursLung: Clear to auscultation bilaterallyAbd: Soft, non-tender, non-distended. Normoactive bowel sounds. G-tube related fistula dressing in place. R sided ostomy bag in place.Ext: LLE minimal edema, RLE no edema, no new rashesLabs: Recent Labs Lab 08/14/230533 WBC 5.5 HGB 11.5* HCT 36.10 PLT 259  Recent Labs Lab 08/14/230533 NEUTROPHILS 44.8  Recent Labs Lab 08/14/230533 NA 142 K 4.1 CL 110* CO2 23 BUN 13 CREATININE 0.39* GLU 91 ANIONGAP 9  Recent Labs Lab 08/14/230533 CALCIUM 10.3*  No results for input(s): ALT, AST, ALKPHOS, BILITOT, BILIDIR in the last 168 hours. No results for input(s): PTT, LABPROT, INR in the last 168 hours. Microbiology:No new data Diagnostics:XR Knee Left AP Lateral and ObliquesResult Date: 7/26/2023Study: XR KNEE LEFT AP LATERAL AND OBLIQUES.  Indication: New severe left knee pain, hx RA Prior: May 09, 2022. Findings: Significant joint space narrowing, subchondral sclerosis, subchondral cystic change and osteophyte formation is seen within the lateral compartment of the left knee. Trace joint effusion is seen. Findings are similar to the prior exam. There is no acute fracture or dislocation. Impression: Advanced osteoarthritis of the lateral  compartment of the left knee with genu valgus deformity. Trace joint effusion. [facility] Radiology Notify System:  Routine Reported and signed by: Herbie Baltimore, MD  [facility] Radiology and Biomedical Imaging Assessment & Plan: ID: This is Danielle [name] a 66 y.o. female with history of RA, multiple abdominal surgeries, chronic hypercalcemia pending Title 19/long term placement.#DispositionPending state notification to start search for long term care. Appreciate SW assistance.#RA#Osteoarthritis#Immobility, contracturesPatient continues on hydroxychloroquine 200mg  daily for RA.- on prednisone 3mg  daily, plan to taper to 2mg  on 8/19- continue voltaren, tylenol, lido patches, ice packs PRN- also has PRN tramadol- gabapentin 300mg  TID for neuropathy- appreciate continued nursing aide to get patient to bedside chair#HypercalcemiaStable, seen by endocrine in the past, thought to be 2/2 to prolonged immobility vs primary hyperparathyroidism. 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 Robb Matar. Signed:Derrion Tritz Rayvon Char, MDPsychiatry, PGY1Date: 8/16/2023Time: 2:15 PM

## 2022-08-06 NOTE — Plan of Care
Danielle [name]					Location: EP [id number] y.o., female				Attending: Daryll Drown, MD	Admit Date: 02/11/2022			[name] LOS: 176 days FOLLOW-UP NUTRITION ASSESSMENT Dietary Orders (From admission, onward)     Start     Ordered  07/25/22 1408  Nutrition Supplements  (Nutrition Supplement Panel)  Once      Question Answer Comment Adult Supplements: Ensure Clear [name] [facility])  Supplement Frequency: Daily    07/25/22 1407  06/20/22 1045  Diet Regular  DIET EFFECTIVE NOW      Question:  Initiate Nutrition Management Protocol (Yes/No?)  Answer:  Yes - Initiate Protocol  06/20/22 1045    No Known AllergiesANTHROPOMETRICS:Height:?70Admit wt:?79.4?kg?(per SNF documentation)Current wt: 74.9?kg (bed, 8/14); BMI: 23.44?kg/m?Dosing weight:?73.5?kg?(based on?wt taken 7/4)??Additional wt information:Ht confirmed?with pt. No wt hx since 2019 per EMR. Wts have fluctuated since admit, likely r/t scale use vs fluid shifts vs some true wt change. Current wt down?4.5?kg (5.7%) from admit wt x 6 months, not significant for timeframe. Noted 1-2+ edema per RN flowsheets. Moderate muscle wasting visualized in clavicle and temple region. Moderate fat wasting visualized in buccal and orbital regions.?Will continue?utilize 73.5 kg as dosing wt d/t frequent fluctuations over admission. Will monitor trends?as available.?Wt: 08/04/22 74.9 kg 07/28/22 73.2 kg 07/09/22 71.6 kg 06/24/22 73.5 kg 12/13/18 98.6 kg ?ESTIMATED NUTRITION REQUIREMENTS:Kcal/day:?1838???????????(25?kcal/kg)Protein/day:?74-88?????(1-1.2?gm/kg)Fluids/day:?1838?mL/d?(1 mL/kcal) ONLY as medical and fluid status allows and only per team discretion.Needs based on:?dosing wt (73.5 kg), maintenance, steroid?NUTRITION ASSESSMENT:?PT EMR reviewed for follow up assessment. Met with pt at bedside. Reports stable po intake over the past week eating at least 75% of three meals and one Ensure Clear daily. Continues to prioritize protein at meals. Voices menu fatigue. Noted PO intake 100% at documented meals. Meds and labs reviewed. Pt likely meeting nutrition targets, but would benefit from continued encouragement to consume high protein meals/snacks including Ensure Clear. ?Cultural/Religious/Ethnic Needs:?NoFood Allergies: None per pt?NUTRITION DIAGNOSIS:Severe malnutrition r/t depletion aeb moderate muscle and fat depletion.?-- improving, continues?INTERVENTIONS/RECOMMENDATIONS:?Meals and Snacks:- Continue?Regular diet- Encourage small frequent meals/snacks- Encourage high kcal/protein foods- Encouraged patient to ask diet tech for creative meal options when placing meal order?Goal:- Pt will consume >/=1 protein rich food with each meal-  met,?continue?Nutrition Supplement Therapy:-?Continue Ensure Clear Berry?daily per pt request??Goal:?- Pt to consume 1 Ensure Clear daily?-?met, continue ?Discharge Planning and Transfer of Nutrition Care:??Nutrition related discharge needs still being determined at this time, will continue to follow?MONITORING / EVALUATION:?Food/Nutrition-Related History:Food and Nutrient Intake?Anthropometric Measurements:Weight ?Biochemical Data, Medical Tests and Procedures:Electrolyte and renal profileGlucose/endocrine profile?Nutrition-Focus Physical FindingsOverall findingsElectronically signed by Erlene Senters, Dietetic Intern, August 16, 2023X-Coverage for Sherrilee Gilles, [facility]- [phone] Please note: Nutrition is a consult only service on weekends and holidays.  Please enter a consult in EPIC if assistance is needed on a weekend or holiday or via [facility] [phone] to contact the covering RD.

## 2022-08-06 NOTE — Plan of Care
Plan of Care Overview/ Patient Status    [name]&Ox4, vss, complains of LLE pain, medications given as scheduled with good effect. PO intake good. Ostomy bag intact, positive for flatus and liquid , brown stool. T&P q 2hrs, heels off loaded . Safety maintained, bed alarm on, call bell in reach.

## 2022-08-06 NOTE — Plan of Care
Plan of Care Overview/ Patient Status    [name]&Ox4, vss, complains of LLE pain, medications given as scheduled with good effect. PO intake good. Ostomy bag intact, positive for flatus and liquid , brown stool. T&P q 2hrs, heels off loaded . OOB to chair today. Left boot worn.  Safety maintained, bed alarm on, call bell in reach.

## 2022-08-06 NOTE — Plan of Care
Plan of Care Overview/ Patient Status    1900 - 0700 A&Ox4. Resting in bed overnight. Severe LLE pain tonight. Per pt sitting in chair and/or boot worsened usual pain. Scheduled and PRN analgesics continued. Positioned for comfort as well. Did fall asleep and appear comfortably. Ostomy and abd dressings changed. T19 process continues. Hourly rounding performed and safety maintained. Please continue to follow POC. See flow sheets for complete assessment.  Problem: Adult Inpatient Plan of CareGoal: Plan of Care ReviewOutcome: Interventions implemented as appropriateFlowsheets (Taken 08/06/2022 0112)Progress: no changePlan of Care Reviewed With: patient

## 2022-08-06 NOTE — Plan of Care
Plan of Care Overview/ Patient Status    Medical Social Work Follow Up  [employer] Most Recent Value Admission Information  Document Type Progress Note Reason for Inability to Assess --  [Patient mental status is altered] (For Inpatient/ED Only) Prior psychosocial assessment has been documented within this hospitalization No (For Inpatient/ED Only) Prior psychosocial assessment has been documented within 30 days of this hospitalization No Reason for Current Social Work Involvement Support/Coping Source of Information Patient, Cabin crew Medical Team Comment Transition Coordination Community Agency Comment [place] [address] [facility] Record Reviewed Yes Level of Care Inpatient Psychosocial issues requiring intervention Support Psychosocial interventions 15 minutes spent with [employer] and Transition Coordination. Transition Coordinator and I met with Unknown to provide support. Blakleigh presented as hopeful and eager to be placed. She was informed by Transition Coordination that approval for Title 19 was still pending. I discussed [employer] Follow[employer] Person program with her and she stated that [name] was working on that. Support and active listening were provided. Nyleah appreciated our visit. [facility] Transition Coordination Specific referrals to enhance community supports (include existing and new resources) None Handoff Required? No Next Steps/Plan (including hand-off): I will continue to remain involved for support as needed. Signature: Leim Fabry, LCSW Contact Information: [phone]

## 2022-08-06 NOTE — Plan of Care
Inpatient Physical Therapy Progress Note IP Adult PT Eval/Treat - 08/06/22 1043    Date of Visit / Treatment  Date of Visit / Treatment 08/06/22   End Time 1043    General Information  Subjective  I need rehab.   Other Providers Co tx with OTR for safety with mobility.   General Observations RN consulted. Pt supine in bed.   Precautions/Limitations Fall Precautions;Bed alarm    Vital Signs and Orthostatic Vital Signs  Vital Signs Vital Signs Stable   Vital Signs Free text BP sitting 129/84, HR 81bpm.    Pain/Comfort  Pain Comment (Pre/Post Treatment Pain) Sore LLE. Pre med for pain prior PT/OT.    Cognition  Overall Cognitive Status WFL   Orientation Level Oriented X4   Level of Consciousness alert   Following Commands Follows one step commands without difficulty   Personal Safety / Judgment Fall risk;Requires supervision with mobility   Long Term Memory Requires cues   Cognition Comments Pleasant. Cooperative. Motivated.    Range of Motion  Range of Motion Comments R Shoulder and LLE limited ROM    Manual Muscle Testing  Manual Muscle Testing Comments + deconditioning    Musculoskeletal  LUE Muscle Strength Grading 3-->active movement against gravity   RUE Muscle Strength Grading --   3-/5  LLE Muscle Strength Grading 2-->active movement with gravity eliminated   Ankle 1/5  RLE Muscle Strength Grading 3-->active movement against gravity    Skin Assessment  Skin Assessment See Nursing Documentation    Balance  Sitting Balance: Static  FAIR-      Contact Guard to maintain static position with no Assistive Device   Sitting Balance: Dynamic  POOR+   Moves through 1/2 range with minimal assist to right self   Balance Skills Training Comment A x 2    Bed Mobility  Supine-to-Sit Independence/Assistance Level Maximum assist;Assist of 2   Supine-to-Sit Assist Device Hand held assist;Head of bed elevated Sit-to-Supine Independence/Assistance Level Maximum assist;Assist of 2   Sit-to-Supine Assist Device Hand held assist   Limitations decreased ability to use arms for pushing/pulling;decreased ability to use legs for bridging/pushing;impaired ability to control trunk for mobility   Bed Mobility, Impairments strength decreased;impaired balance;postural control impaired   Bed Mobility Comments Leaning to R side. Sitting EOB with A x 1.    Soil scientist Independence/Assistance Level Maximum assist;Assist of 2   Sit-to-Stand Transfer Assist Device Hand held assist;Stedy   Stand-to-Sit Transfer Independence/Assistance Level Maximum assist;Assist of 2   Stand-to-Sit Transfer Assist Device Hand held assist   Transfer Safety Analysis Concerns losing balance backward;decreased weight-shifting ability   Transfer Safety Analysis Impairments impaired balance;impaired postural control;decreased strength   Transfers were limited by: impaired motor function   Sit-Stand Transfer Comments STS x 2 reps with HHA then STS with Steady. Able to clear EOB but unable to obtain full upright posture.    Gait Training  Independence/Assistance Level  Unable to perform    Handoff Documentation  Handoff Patient in bed;Bed alarm;Patient instructed to call nursing for mobility;Discussed with nursing    Endurance  Endurance Comments Fair-    PT- AM-PAC - Basic Mobility Screen- How much help from another person do you currently need.....  Turning from your back to your side while in a a flat bed without using rails? 2 - A Lot - Requires a lot of help (maximum to moderate assistance). Can use assistive devices.   Moving from lying on your back to sitting  on the side of a flat bed without using bed rails? 1 - Total - Requires total assistance or cannot do it at all.   Moving to and from a bed to a chair (including a wheelchair)? 1 - Total - Requires total assistance or cannot do it at all.   Standing up from a chair using your arms(e.g., wheelchair or bedside chair)? 1 - Total - Requires total assistance or cannot do it at all.   To walk in a hospital room? 1 - Total - Requires total assistance or cannot do it at all.   Climbing 3-5 steps with a railing? 1 - Total - Requires total assistance or cannot do it at all.   AMPAC Mobility Score 7   TARGET Highest Level of Mobility Mobility Level 2, Turn self in bed/bed activity/dependent transfer    ACTUAL Highest Level of Mobility Mobility Level 3, Sit on edge of bed    Therapeutic Exercise  Therapeutic Exercise Comments Seated (B) LE/UE AROM to AAROM    Clinical Impression  Follow up Assessment Pt is Max A x 2 with bed mobility. Attempted STS x 3 reps with HHA and Stedy. Deconditioned. Limited RUE/LE ROM. Soreness/pain LLE. Motivated with PT/OT services to gain strength, balance and endurance to return to baseline.    Patient/Family Stated Goals  Patient/Family Stated Goal(s) get stronger    Frequency/Equipment Recommendations  PT Frequency 2x per week   What day of week is next treatment expected? Friday   8/18  PT/PTA completing this assessment DSG    PT Recommendations for Inpatient Admission  Activity/Level of Assist dangle edge of bed;assist of 2   Hoyer Lift OOB to Recliner  ADL Recommendations assist of 2   Positioning chair position of the bed   Therapeutic Exercise ROM as tolerated    Planned Treatment / Interventions  Plan for Next Visit Progress as tol   Training Treatment / Interventions Balance / Investment banker, operational;Endurance Training;Functional Personnel officer / Interventions Patient Education / Training    PT Discharge Summary  Physical Therapy Disposition Recommendation SNF for long term placement     Clearnce Hasten, PTAMHB [phone] of Care Overview/ Patient Status

## 2022-08-06 NOTE — Plan of Care
Inpatient Occupational Therapy Progress NoteDefault Flowsheet Data (most recent)   IP Adult OT Eval/Treat - 08/06/22 1050    Date of Visit / Treatment  Date of Visit / Treatment 08/06/22   End Time 1050    General Information  Subjective We'll see if I can do it   General Observations Pt received supine in bed, RN cleared for session, (+) purewick, agreeable to therapy session   Precautions/Limitations Fall Precautions;Bed alarm;Chair alarm    Vital Signs and Orthostatic Vital Signs  Vital Signs Free text with transition to EOB BP 129/84, HR 81    Pain/Comfort  Pain Comment (Pre/Post Treatment Pain) initially reporting LLE pain then reported it just felt stiff, improved with repositioning    Cognition  Orientation Level Oriented X4   Level of Consciousness alert    Vision/ Hearing  Vision Assessment Results No vision deficits noted    Range of Motion  Range of Motion Comments R shoulder and L hip/knee/ankle limited    Musculoskeletal  LUE Muscle Strength Grading 3-->active movement against gravity   RUE Muscle Strength Grading --   3-   Muscle Tone  Muscle Tone Testing Results No muscle tone deficits noted    Coordination  Fine Motor Coordination [facility]    Sensory Assessment  Sensory Tests Results No sensory impairment noted    Skin Assessment  Skin Assessment See Nursing Documentation    Balance  Sitting Balance: Static  FAIR-      Contact Guard to maintain static position with no Assistive Device   Sitting Balance: Dynamic  POOR+   Moves through 1/2 range with minimal assist to right self   Balance Skills Training Comment leans L sitting EOB     AM-PAC - Daily Activity IP Short Form  Help needed from another person putting on/taking off regular lower body clothing 1 - Unable   Help needed from another person for bathing (incl. washing, rinsing, drying) 2 - A Lot   Help needed from another person for toileting (incl. using toilet, bedpan, urinal) 1 - Unable   Help needed from another person putting on/taking off regular upper body clothing 2 - A Lot   Help needed from another person taking care of personal grooming such as brushing teeth 3 - A Little   Help needed from another person eating meals 4 - None   AM-PAC Daily Activity Raw Score (Total of rows above) 13   CMS Score (based on Raw Score - with G Code) 13 - 63.03% impaired      (G Code - CL)    Bed Mobility  Supine-to-Sit Independence/Assistance Level Maximum assist;Assist of 2;Verbal cues   Supine-to-Sit Assist Device Bed rails;Hand held assist   Sit-to-Supine Independence/Assistance Level Maximum assist;Assist of 2   Sit-to-Supine Assist Device Bed rails;Hand held assist    Sit-Stand Transfer Training  Sit-to-Stand Transfer Independence/Assistance Level Maximum assist;Assist of 2;Verbal cues   Sit-to-Stand Transfer Assist Device Hand held assist;Stedy   Stand-to-Sit Transfer Independence/Assistance Level Maximum assist;Assist of 2;Verbal cues   Stand-to-Sit Transfer Assist Device Hand held assist;Stedy   Sit-Stand Transfer Comments 2 trials with HHA from EOB, pt unable to come fully upright.Trialed once with stedy but still unable to come fully upright    Gait Training  Independence/Assistance Level  Unable to perform    Handoff Documentation  Handoff Patient in bed;Bed alarm;Discussed with nursing;Patient instructed to call nursing for mobility    Therapeutic Functional Activity  Therapeutic Functional Activity Comments bed mob, postural control, sitting/standing  posture    Clinical Impression  Follow up Assessment 8/16- Pt tol OT session fairly. Remains limited by impaired strength, balance, and endurance. Cont to benefit from skilled OT.    Frequency/Equipment Recommendations  OT Frequency 2x per week   What day of week is next treatment expected? Friday   8/18   OT Recommendations for Inpatient Admission  Activity/Level of Assist transfers only;mechanical lift;assist of 2   ADL Recommendations assist of 2   bed level   OT Discharge Summary  Disposition Recommendation SNF for long term placement    Planned Treatment / Interventions  Education Treatment / Interventions Patient Education / Training

## 2022-08-06 NOTE — Progress Notes
[facility] Progress NoteSubjective: Hospital Day: 175- No acute events overnight- Feeling well, no complaints- Sitting in chair at bedside when team came to examineReview of Allergies/Meds/Hx: Allergies: Patient has no known allergies.Medications: Scheduled Meds:Current Facility-Administered Medications Medication Dose Route Frequency Provider Last Rate Last Admin ? diclofenac (VOLTAREN) 1 % gel 4 g  4 g Topical (Top) 4x Daily Larna Daughters, MD   4 g at 08/05/22 4132 ? enoxaparin (LOVENOX) injection 80 mg  80 mg Subcutaneous Q12H Abid, Bushra, MD   80 mg at 08/05/22 4401 ? famotidine (PEPCID) tablet 20 mg  20 mg Oral Daily Senaida Lange, MD   20 mg at 08/05/22 0272 ? ferrous gluconate (FERGON) tablet 324 mg  324 mg Oral Every Other Day Larna Daughters, MD   324 mg at 08/04/22 0936 ? fluticasone propionate (FLONASE) 50 mcg/actuation nasal spray 1 spray  1 spray each nostril Daily Jonna Coup, MD   1 spray at 08/05/22 5366 ? gabapentin (NEURONTIN) capsule 300 mg  300 mg Oral TID Jonna Coup, MD   300 mg at 08/05/22 4403 ? hydrOXYchloroQUINE (PLAQUENIL) tablet 200 mg  200 mg Oral Daily Babette Relic, MD   200 mg at 08/05/22 4742 ? lidocaine 4 % topical patch 1 patch  1 patch Transdermal Q24H Babette Relic, MD   1 patch at 08/04/22 1500 ? melatonin tablet 3 mg  3 mg Oral Nightly Pryor Ochoa, MD   3 mg at 08/03/22 2150 ? nystatin (MYCOSTATIN) powder 1 Application  1 Application  Topical (Top) BID Henriette Combs, MD   1 Application  at 08/05/22 [phone] ? predniSONE (DELTASONE) tablet 3 mg  3 mg Oral Daily Jonna Coup, MD   3 mg at 08/05/22 3875 ? rosuvastatin (CRESTOR) tablet 10 mg  10 mg Per G Tube Nightly Dorisann Frames, MD   10 mg at 08/04/22 2158 Continuous Infusions:PRN Meds:.acetaminophen, dextrose (GLUCOSE) oral liquid 15 g **OR** fruit juice **OR** skim milk, dextrose (GLUCOSE) oral liquid 30 g **OR** fruit juice, dextrose injection, dextrose injection, Lidocaine Patch, traMADoLObjective: Vitals:Temp:  [97.9 ?F (36.6 ?C)-98.3 ?F (36.8 ?C)] 97.9 ?F (36.6 ?C)Pulse:  [62-67] 62Resp:  [14-20] 14BP: (96-99)/(63-64) 96/64SpO2:  [99 %-100 %] 100 %Device (Oxygen Therapy): room airWeight change: I/O's:Gross Totals (Last 24 hours) at 08/05/2022 1214Last data filed at 08/05/2022 0549Intake -- Output 1050 ml Net -1050 ml Physical Exam General: Well appearing in no acute distress, alert and oriented to person, place, time and situationCV: Normal rate and regular rhythm, no murmursLung: Clear to auscultation bilaterallyAbd: Soft, non-tender, non-distended. Normoactive bowel sounds. G-tube related fistula dressing in place. R sided ostomy bag in place.Ext: LLE minimal edema, RLE no edema, no new rashesLabs: Recent Labs Lab 08/14/230533 WBC 5.5 HGB 11.5* HCT 36.10 PLT 259  Recent Labs Lab 08/14/230533 NEUTROPHILS 44.8  Recent Labs Lab 08/14/230533 NA 142 K 4.1 CL 110* CO2 23 BUN 13 CREATININE 0.39* GLU 91 ANIONGAP 9  Recent Labs Lab 08/14/230533 CALCIUM 10.3*  No results for input(s): ALT, AST, ALKPHOS, BILITOT, BILIDIR in the last 168 hours. No results for input(s): PTT, LABPROT, INR in the last 168 hours. Microbiology:No new data Diagnostics:XR Knee Left AP Lateral and ObliquesResult Date: 7/26/2023Study: XR KNEE LEFT AP LATERAL AND OBLIQUES.  Indication: New severe left knee pain, hx RA Prior: May 09, 2022. Findings: Significant joint space narrowing, subchondral sclerosis, subchondral cystic change and osteophyte formation is seen within the lateral compartment of the left knee. Trace joint effusion is seen. Findings are similar to the prior exam. There  is no acute fracture or dislocation. 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 Trace joint effusion. [facility] Radiology Notify System:  Routine Reported and signed by: Herbie Baltimore, MD  [facility] Radiology and Biomedical Imaging Assessment & Plan: ID: This is Danielle [name] a 66 y.o. female with history of RA, multiple abdominal surgeries, chronic hypercalcemia pending Title 19/long term placement.#DispositionPending state notification to start search for long term care. Appreciate SW assistance.#RA#Osteoarthritis#Immobility, contracturesPatient continues on hydroxychloroquine 200mg  daily for RA.- on prednisone 3mg  daily, plan to taper to 2mg  on 8/19- continue voltaren, tylenol, lido patches, ice packs PRN- also has PRN tramadol- gabapentin 300mg  TID for neuropathy- appreciate continued nursing aide to get patient to bedside chair#HypercalcemiaStable, seen by endocrine in the past, thought to be 2/2 to prolonged immobility vs primary hyperparathyroidism. 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 Robb Matar. Signed:Damyen Knoll Rayvon Char, MDPsychiatry, PGY1Date: 8/15/2023Time: 12:14 PM

## 2022-08-07 NOTE — Plan of Care
Inpatient Physical Therapy Progress Note IP Adult PT Eval/Treat - 08/07/22 1322    Date of Visit / Treatment  Date of Visit / Treatment 08/07/22   Note Type Progress Note   Progress Report Due 09/24/22   End Time 1322    General Information  Subjective Can you show me how to strengthen my arms?   General Observations Pt received supine in bed on EP 9-7 w/ hoyer pad; on RA; NAD; purwick; ostomy; L podus boot; RN cleared   Precautions/Limitations Fall Precautions;Bed alarm;Chair alarm   Precautions/Limitations Comment Aspiration Precaution    Vital Signs and Orthostatic Vital Signs  Vital Signs Vital Signs Stable   Vital Signs Free text RA; NAD    Pain/Comfort  Pain Comment (Pre/Post Treatment Pain) No c/o pain pre/post session    Patient Coping  Observed Emotional State accepting;cooperative   Verbalized Emotional State acceptance    Cognition  Overall Cognitive Status WFL   Orientation Level Oriented X4   Level of Consciousness alert   Following Commands Follows one step commands without difficulty   Personal Safety / Judgment Fall risk;Requires supervision with mobility   Cognition Comments Pleasant and cooperative    Range of Motion  Range of Motion Comments Limited terminal hip/knee extension; developing B ankle PF contractures (L>R); limited shoulder elevation R>L)    Manual Muscle Testing  Manual Muscle Testing Comments + Deconditioned    Musculoskeletal  LUE Muscle Strength Grading 3-->active movement against gravity   RUE Muscle Strength Grading --   3-/5  LLE Muscle Strength Grading 2-->active movement with gravity eliminated   Ankle: 1/5  RLE Muscle Strength Grading 3-->active movement against gravity    Skin Assessment  Skin Assessment See Nursing Documentation    Balance  Balance Skills Training Comment Bed level session to review BUE/BLE strengthening supine HEP    Bed Mobility  Bed Mobility Comments Bed level session to review BUE/BLE strengthening supine HEP    Handoff Documentation  Handoff Patient in bed;Bed alarm;Patient instructed to call nursing for mobility;Discussed with nursing    Endurance  Endurance Comments Fair-    PT- AM-PAC - Basic Mobility Screen- How much help from another person do you currently need.....  Turning from your back to your side while in a a flat bed without using rails? 2 - A Lot - Requires a lot of help (maximum to moderate assistance). Can use assistive devices.   Moving from lying on your back to sitting on the side of a flat bed without using bed rails? 1 - Total - Requires total assistance or cannot do it at all.   Moving to and from a bed to a chair (including a wheelchair)? 1 - Total - Requires total assistance or cannot do it at all.   Standing up from a chair using your arms(e.g., wheelchair or bedside chair)? 1 - Total - Requires total assistance or cannot do it at all.   To walk in a hospital room? 1 - Total - Requires total assistance or cannot do it at all.   Climbing 3-5 steps with a railing? 1 - Total - Requires total assistance or cannot do it at all.   AMPAC Mobility Score 7   TARGET Highest Level of Mobility Mobility Level 2, Turn self in bed/bed activity/dependent transfer    ACTUAL Highest Level of Mobility Mobility Level 2, Turn self in bed/bed activity/ dependent transfer    Therapeutic Exercise  Therapeutic Exercise Comments BUE/BLE AROM w/ theraband    Therapeutic  Functional Activity  Therapeutic Functional Activity Comments Pt edu on HEP    Orthotic Fabrication/Training  Orthotics Fabrication/Training Comments L podus boot    Clinical Impression  Follow up Assessment Pt seen for PT f/u. Pt initially deferred therapy session until tomorrow before asking to review supine strengthening HEP consisting of BUE/BLE therex w/ theraband. Reviewed exercises w/ Pt w/ Pt edu on reps, sets, frequency. Will benefit from skilled services to improve gross functional strength, dynamic balance, and activity tolerance. Current PT d/c rec is SNF for LTC.   Criteria for Skilled Therapeutic Interventions Met yes    Patient/Family Stated Goals  Patient/Family Stated Goal(s) return to prior level of functioning;decrease pain;get stronger;feel better    Frequency/Equipment Recommendations  PT Frequency 3x per week   What day of week is next treatment expected? Friday   8/18  PT/PTA completing this assessment RL   Equipment Needs During Admission/Treatment Hospital bed;Hoyer lift    PT Recommendations for Inpatient Admission  Activity/Level of Assist dangle edge of bed;assist of 2   ADL Recommendations mechanical lift;assist of 2   Positioning chair position of the bed;reposition frequently;elevate heels   Therapeutic Exercise ROM as tolerated   Other/Comments Pillow between knees in supine for contracture management and prevention of skin breakdown; encourage B knee extension in supine; encourage B podus boots or shorten bed or foam to maintain 0 deg of ankle DF    Planned Treatment / Interventions  Plan for Next Visit Progress as tol   Training Treatment / Interventions Balance / Investment banker, operational;Endurance Training;Functional Personnel officer / Interventions Patient Education / Training    PT Discharge Summary  Physical Therapy Disposition Recommendation SNF for long term placement     Shanon Payor PT, DPTMHB: [phone]

## 2022-08-07 NOTE — Plan of Care
Plan of Care Overview/ Patient Status     Patient is alert and oriented x4. Swallow pills whole. Vitals stable. Lungs clear upon auscultation. No c/o pain. Bowel sounds present, continent of bladder, RUQ ileostomy bag emptied on my shift. Hourly safety rounding's, bed at lowest position, call bell within reach.

## 2022-08-07 NOTE — Plan of Care
Plan of Care Overview/ Patient Status    1900 - 0700 A&Ox4. Resting in bed overnight. Reported no pain to LLE tonight at rest, but moderate pain w/activity. Scheduled analgesics administered. No new issues or concerns overnight. Appeared to rest comfortably. T19 process continues. Hourly rounding performed and safety maintained. Please continue to follow POC. See flow sheets for complete assessment. 0640 PRN Tramadol given for LLE pain. Problem: Adult Inpatient Plan of CareGoal: Plan of Care Review8/17/2023 0307 by Jones Skene, RNOutcome: Interventions implemented as appropriateFlowsheetsTaken 08/07/2022 0307Plan of Care Reviewed With: patientTaken 08/06/2022 0112Progress: no change8/17/2023 0307 by Jones Skene, RNOutcome: Interventions implemented as appropriate

## 2022-08-08 NOTE — Plan of Care
Plan of Care Overview/ Patient Status    1900-0700: A/o x4, forgetful, VSS on room air. Denies pain or discomfort at this time.  Denies SOB/chest pain. ostomy in place, CDI, purewick in place, incontinence care provided as needed. heels offloaded, please see flowsheet for full skin assessment. Pills whole. Assist x 2 in bed. T & R Q2 hours. Hourly rounding completed, safety maintained. Call bell within reach

## 2022-08-08 NOTE — Progress Notes
[facility] Progress NoteSubjective: Hospital Day: 177- No acute events overnight- Some unchanged pain in legs alleviated by bed restReview of Allergies/Meds/Hx: Allergies: Patient has no known allergies.Medications: Scheduled Meds:Current Facility-Administered Medications Medication Dose Route Frequency Provider Last Rate Last Admin ? diclofenac (VOLTAREN) 1 % gel 4 g  4 g Topical (Top) 4x Daily Soleymanjahi, Saeed, MD   4 g at 08/07/22 0816 ? enoxaparin (LOVENOX) injection 80 mg  80 mg Subcutaneous Q12H Abid, Bushra, MD   80 mg at 08/07/22 0817 ? famotidine (PEPCID) tablet 20 mg  20 mg Oral Daily Senaida Lange, MD   20 mg at 08/07/22 0817 ? ferrous gluconate (FERGON) tablet 324 mg  324 mg Oral Every Other Day Larna Daughters, MD   324 mg at 08/06/22 0951 ? fluticasone propionate (FLONASE) 50 mcg/actuation nasal spray 1 spray  1 spray each nostril Daily Jonna Coup, MD   1 spray at 08/07/22 0818 ? gabapentin (NEURONTIN) capsule 300 mg  300 mg Oral TID Jonna Coup, MD   300 mg at 08/07/22 1325 ? hydrOXYchloroQUINE (PLAQUENIL) tablet 200 mg  200 mg Oral Daily Babette Relic, MD   200 mg at 08/07/22 1610 ? lidocaine 4 % topical patch 1 patch  1 patch Transdermal Q24H Babette Relic, MD   1 patch at 08/07/22 1448 ? melatonin tablet 3 mg  3 mg Oral Nightly Pryor Ochoa, MD   3 mg at 08/06/22 2210 ? nystatin (MYCOSTATIN) powder 1 Application  1 Application  Topical (Top) BID Henriette Combs, MD   1 Application  at 08/07/22 0818 ? predniSONE (DELTASONE) tablet 3 mg  3 mg Oral Daily Jonna Coup, MD   3 mg at 08/07/22 9604 ? rosuvastatin (CRESTOR) tablet 10 mg  10 mg Per G Tube Nightly Dorisann Frames, MD   10 mg at 08/06/22 2210 Continuous Infusions:PRN Meds:.acetaminophen, dextrose (GLUCOSE) oral liquid 15 g **OR** fruit juice **OR** skim milk, dextrose (GLUCOSE) oral liquid 30 g **OR** fruit juice, dextrose injection, dextrose injection, traMADoLObjective: Vitals:Temp:  [97.6 ?F (36.4 ?C)-98.2 ?F (36.8 ?C)] 97.6 ?F (36.4 ?C)Pulse:  [68-83] 83Resp:  [16] 16BP: (94-100)/(63) 100/63SpO2:  [96 %-97 %] 96 %Device (Oxygen Therapy): room airWeight change: I/O's:Gross Totals (Last 24 hours) at 08/07/2022 1525Last data filed at 08/07/2022 1100Intake 240 ml Output 1100 ml Net -860 ml Physical Exam  unchangedGeneral: Well appearing in no acute distress, alert and oriented to person, place, time and situationCV: Normal rate and regular rhythm, no murmursLung: Clear to auscultation bilaterallyAbd: Soft, non-tender, non-distended. Normoactive bowel sounds. G-tube related fistula dressing in place. R sided ostomy bag in place.Ext: LLE minimal edema, RLE no edema, no new rashesLabs: Recent Labs Lab 08/14/230533 WBC 5.5 HGB 11.5* HCT 36.10 PLT 259  Recent Labs Lab 08/14/230533 NEUTROPHILS 44.8  Recent Labs Lab 08/14/230533 NA 142 K 4.1 CL 110* CO2 23 BUN 13 CREATININE 0.39* GLU 91 ANIONGAP 9  Recent Labs Lab 08/14/230533 CALCIUM 10.3*  No results for input(s): ALT, AST, ALKPHOS, BILITOT, BILIDIR in the last 168 hours. No results for input(s): PTT, LABPROT, INR in the last 168 hours. Microbiology:No new data Diagnostics:XR Knee Left AP Lateral and ObliquesResult Date: 7/26/2023Study: XR KNEE LEFT AP LATERAL AND OBLIQUES.  Indication: New severe left knee pain, hx RA Prior: May 09, 2022. Findings: Significant joint space narrowing, subchondral sclerosis, subchondral cystic change and osteophyte formation is seen within the lateral compartment of the left knee. Trace joint effusion is seen. Findings are similar to the prior exam. There is no acute fracture or  dislocation. 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 Trace joint effusion. [facility] Radiology Notify System:  Routine Reported and signed by: Herbie Baltimore, MD  [facility] Radiology and Biomedical Imaging Assessment & Plan: ID: This is Danielle [name] a 66 y.o. female with history of RA, multiple abdominal surgeries, chronic hypercalcemia pending Title 19/long term placement.#DispositionPending state notification to start search for long term care. Appreciate SW assistance.- Consult placed to volunteer services to see if patient can get reading materials and/or iPad for cognitive stimulation#RA#Osteoarthritis#Immobility, contracturesPatient continues on hydroxychloroquine 200mg  daily for RA.- on prednisone 3mg  daily, plan to taper to 2mg  on 8/19- continue voltaren, tylenol, lido patches, ice packs PRN- also has PRN tramadol- gabapentin 300mg  TID for neuropathy- appreciate continued nursing aide to get patient to bedside chair#HypercalcemiaStable, seen by endocrine in the past, thought to be 2/2 to prolonged immobility vs primary hyperparathyroidism. 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 Robb Matar. Signed:Akisha Sturgill Rayvon Char, MDPsychiatry, PGY1Date: 8/17/2023Time: 3:25 PM

## 2022-08-08 NOTE — Progress Notes
[facility] Progress NoteSubjective: Hospital Day: 178- No acute events overnightReview of Allergies/Meds/Hx: Allergies: Patient has no known allergies.Medications: Scheduled Meds:Current Facility-Administered Medications Medication Dose Route Frequency Provider Last Rate Last Admin ? diclofenac (VOLTAREN) 1 % gel 4 g  4 g Topical (Top) 4x Daily Larna Daughters, MD   4 g at 08/08/22 0835 ? enoxaparin (LOVENOX) injection 80 mg  80 mg Subcutaneous Q12H Abid, Bushra, MD   80 mg at 08/08/22 0835 ? famotidine (PEPCID) tablet 20 mg  20 mg Oral Daily Senaida Lange, MD   20 mg at 08/08/22 0835 ? ferrous gluconate (FERGON) tablet 324 mg  324 mg Oral Every Other Day Larna Daughters, MD   324 mg at 08/08/22 0835 ? fluticasone propionate (FLONASE) 50 mcg/actuation nasal spray 1 spray  1 spray each nostril Daily Jonna Coup, MD   1 spray at 08/08/22 [phone] ? gabapentin (NEURONTIN) capsule 300 mg  300 mg Oral TID Jonna Coup, MD   300 mg at 08/08/22 1456 ? hydrOXYchloroQUINE (PLAQUENIL) tablet 200 mg  200 mg Oral Daily Babette Relic, MD   200 mg at 08/08/22 9811 ? lidocaine 4 % topical patch 1 patch  1 patch Transdermal Q24H Babette Relic, MD   1 patch at 08/08/22 1456 ? melatonin tablet 3 mg  3 mg Oral Nightly Pryor Ochoa, MD   3 mg at 08/07/22 2157 ? nystatin (MYCOSTATIN) powder 1 Application  1 Application  Topical (Top) BID Henriette Combs, MD   1 Application  at 08/08/22 [phone] ? predniSONE (DELTASONE) tablet 3 mg  3 mg Oral Daily Jonna Coup, MD   3 mg at 08/08/22 8295 ? rosuvastatin (CRESTOR) tablet 10 mg  10 mg Per G Tube Nightly Dorisann Frames, MD   10 mg at 08/07/22 2157 Continuous Infusions:PRN Meds:.acetaminophen, dextrose (GLUCOSE) oral liquid 15 g **OR** fruit juice **OR** skim milk, dextrose (GLUCOSE) oral liquid 30 g **OR** fruit juice, dextrose injection, dextrose injection, traMADoLObjective: Vitals:Temp:  [98.2 ?F (36.8 ?C)] 98.2 ?F (36.8 ?C)Pulse:  [60] 60Resp:  [16] 16BP: (94)/(63) 94/63SpO2:  [97 %] 97 %Device (Oxygen Therapy): room airWeight change: I/O's:Gross Totals (Last 24 hours) at 08/08/2022 1716Last data filed at 08/08/2022 0942Intake -- Output 450 ml Net -450 ml Physical Exam  unchangedGeneral: Well appearing in no acute distress, alert and oriented to person, place, time and situationCV: Normal rate and regular rhythm, no murmursLung: Clear to auscultation bilaterallyAbd: Soft, non-tender, non-distended. Normoactive bowel sounds. G-tube related fistula dressing in place. R sided ostomy bag in place.Ext: LLE minimal edema, RLE no edema, no new rashesLabs: Recent Labs Lab 08/14/230533 WBC 5.5 HGB 11.5* HCT 36.10 PLT 259  Recent Labs Lab 08/14/230533 NEUTROPHILS 44.8  Recent Labs Lab 08/14/230533 NA 142 K 4.1 CL 110* CO2 23 BUN 13 CREATININE 0.39* GLU 91 ANIONGAP 9  Recent Labs Lab 08/14/230533 CALCIUM 10.3*  No results for input(s): ALT, AST, ALKPHOS, BILITOT, BILIDIR in the last 168 hours. No results for input(s): PTT, LABPROT, INR in the last 168 hours. Microbiology:No new data Diagnostics:XR Knee Left AP Lateral and ObliquesResult Date: 7/26/2023Study: XR KNEE LEFT AP LATERAL AND OBLIQUES.  Indication: New severe left knee pain, hx RA Prior: May 09, 2022. Findings: Significant joint space narrowing, subchondral sclerosis, subchondral cystic change and osteophyte formation is seen within the lateral compartment of the left knee. Trace joint effusion is seen. Findings are similar to the prior exam. There is no acute fracture or dislocation. 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 Trace joint effusion. [facility] Radiology Notify System:  Routine Reported and signed by: Herbie Baltimore, MD  [facility] Radiology and Biomedical Imaging Assessment & Plan: ID: This is Danielle [name] a 66 y.o. female with history of RA, multiple abdominal surgeries, chronic hypercalcemia pending Title 19/long term placement.#DispositionPending state notification to start search for long term care. Appreciate SW assistance.- Consult placed to volunteer services to see if patient can get reading materials and/or iPad for cognitive stimulation#RA#Osteoarthritis#Immobility, contracturesPatient continues on hydroxychloroquine 200mg  daily for RA.- on prednisone 3mg  daily, plan to taper to 2mg  on 8/19- continue voltaren, tylenol, lido patches, ice packs PRN- also has PRN tramadol- gabapentin 300mg  TID for neuropathy- appreciate continued nursing aide to get patient to bedside chair#HypercalcemiaStable, seen by endocrine in the past, thought to be 2/2 to prolonged immobility vs primary hyperparathyroidism. 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 Robb Matar. Signed:Garlin Batdorf Rayvon Char, MDPsychiatry, PGY1Date: 8/18/2023Time: 5:16 PM

## 2022-08-08 NOTE — Plan of Care
Plan of Care Overview/ Patient Status    0700-1900Patient is A&Ox4. VSS on RA. Denies pain, SOB, N/V/D. Assist x2 in bed. Pt up in chair this shift. Incontinent of bladder. Purewick in place. Ostomy +flatus/stool. Dressings on abdomen changed. Pills whole. All scheduled medications administered as ordered. Q2hr T&R as pt allowed.Safety maintained. Hourly rounding performed. Call bell within reach. Bed alarm on. Will continue to follow POC.

## 2022-08-08 NOTE — Plan of Care
Inpatient Physical Therapy Progress Note IP Adult PT Eval/Treat - 08/08/22 1155    Date of Visit / Treatment  Date of Visit / Treatment 08/08/22   Note Type Progress Note   Progress Report Due 09/24/22   End Time 1155    General Information  Subjective Have you seen the new Barbie movie?   General Observations Pt received seated OOB on EP9-7 in recliner; on RA; NAD; purwick; ostomy; L podus boot; RN cleared   Precautions/Limitations Fall Precautions;Bed alarm;Chair alarm   Precautions/Limitations Comment Aspiration Precaution    Weight Bearing Status  Weight Bearing Status Comments WBAT BLE    Vital Signs and Orthostatic Vital Signs  Vital Signs Vital Signs Stable   Vital Signs Free text RA; NAD    Pain/Comfort  Pain Comment (Pre/Post Treatment Pain) Pt c/o 8/10 L knee/ankle pain when mobilized; RN aware and repositioned for comfort    Patient Coping  Observed Emotional State accepting;cooperative   Verbalized Emotional State acceptance    Cognition  Overall Cognitive Status WFL   Orientation Level Oriented X4   Level of Consciousness alert   Following Commands Follows one step commands without difficulty   Personal Safety / Judgment Fall risk;Requires supervision with mobility   Cognition Comments Pleasant and cooperative    Range of Motion  Range of Motion Examination WFL except   Range of Motion Comments Limited terminal hip/knee extension; developing B ankle PF contractures (L>R); limited shoulder elevation R>L)    Manual Muscle Testing  Manual Muscle Testing Comments + Deconditioned    Musculoskeletal  LUE Muscle Strength Grading 3-->active movement against gravity   RUE Muscle Strength Grading --   3-/5  LLE Muscle Strength Grading 2-->active movement with gravity eliminated   RLE Muscle Strength Grading 3-->active movement against gravity    Skin Assessment  Skin Assessment See Nursing Documentation Posture, Head/Trunk Alignment  Posture, Head/Trunk Alignment Right lateral lean    Balance  Sitting Balance: Static  FAIR-      Contact Guard to maintain static position with no Assistive Device   Sitting Balance: Dynamic  POOR+   Moves through 1/2 range with minimal assist to right self   Balance Assist Device Hand held assist   Balance Skills Training Comment Ax1    Bed Mobility  Bed Mobility Comments Pt received seated OOB in recliner after being hoyered bed>chair. Performed BUE/BLE therex seated in recliner consisiting of PROM BLE/RUE, LAQs BLE, ankle pumps, manually resisted BUE flex/ext, crossbody reaches. Repositioned in recliner w/ pillow support d/t R lateral lean.    Museum/gallery curator Comments Deferred d/t weakness, incoordination, and incr need for skilled assist    Handoff Documentation  Handoff Patient in chair;Chair alarm;Patient instructed to call nursing for mobility;Discussed with nursing    Endurance  Endurance Comments Fair-    PT- AM-PAC - Basic Mobility Screen- How much help from another person do you currently need.....  Turning from your back to your side while in a a flat bed without using rails? 2 - A Lot - Requires a lot of help (maximum to moderate assistance). Can use assistive devices.   Moving from lying on your back to sitting on the side of a flat bed without using bed rails? 1 - Total - Requires total assistance or cannot do it at all.   Moving to and from a bed to a chair (including a wheelchair)? 1 - Total - Requires total assistance or cannot do it at all.  Standing up from a chair using your arms(e.g., wheelchair or bedside chair)? 1 - Total - Requires total assistance or cannot do it at all.   To walk in a hospital room? 1 - Total - Requires total assistance or cannot do it at all.   Climbing 3-5 steps with a railing? 1 - Total - Requires total assistance or cannot do it at all.   AMPAC Mobility Score 7   TARGET Highest Level of Mobility Mobility Level 2, Turn self in bed/bed activity/dependent transfer    ACTUAL Highest Level of Mobility Mobility Level 2, Turn self in bed/bed activity/ dependent transfer    Therapeutic Exercise  Therapeutic Exercise Comments PROM BLE/RUE, LAQs BLE, ankle pumps, manually resisted BUE flex/ext, crossbody reaches.    Neuromuscular Re-education  Neuromuscular Re-education Comments Seated static/dynamic PC training    Therapeutic Functional Activity  Therapeutic Functional Activity Comments Pt edu, repositioned    Orthotic Fabrication/Training  Orthotics Fabrication/Training Comments L podus boot    Clinical Impression  Follow up Assessment Pt seen for PT f/u and tolerated well. Pt received seated OOB in recliner after being hoyered bed>chair. Performed BUE/BLE therex seated in recliner consisiting of PROM BLE/RUE, LAQs BLE, ankle pumps, manually resisted BUE flex/ext, crossbody reaches. Repositioned in recliner w/ pillow support d/t R lateral lean. Will benefit from skilled services to improve gross functional ROM/strength, postural control, and activity tolerance. Current PT d/c rec is SNF for LTC.   Criteria for Skilled Therapeutic Interventions Met yes    Patient/Family Stated Goals  Patient/Family Stated Goal(s) return to prior level of functioning;decrease pain;get stronger;feel better    Frequency/Equipment Recommendations  PT Frequency 3x per week   What day of week is next treatment expected? Monday   8/21  PT/PTA completing this assessment RL   Equipment Needs During Admission/Treatment Hospital bed;Hoyer lift    PT Recommendations for Inpatient Admission  Activity/Level of Assist dangle edge of bed;assist of 2   ADL Recommendations mechanical lift;assist of 2   Positioning chair position of the bed;reposition frequently;elevate heels   Therapeutic Exercise ROM as tolerated    Planned Treatment / Interventions  Plan for Next Visit Progress as tol   Training Treatment / Interventions Balance / Gait Training;Endurance Training;Functional Personnel officer / Interventions Patient Education / Training    PT Discharge Summary  Physical Therapy Disposition Recommendation SNF for long term placement     Shanon Payor PT, DPTMHB: [phone]

## 2022-08-09 MED ORDER — ONDANSETRON 4 MG DISINTEGRATING TABLET
4 mg | Freq: Once | Status: CP
Start: 2022-08-09 — End: ?
  Administered 2022-08-09: 11:00:00 4 mg

## 2022-08-09 NOTE — Plan of Care
Plan of Care Overview/ Patient Status    1900-0700: A&Ox4. VSS on RA. Complaint of Left leg pain, prn tramadol given with good effect. Regular diet, good po intake. Incontinent of urine, purewick in place, draining clear yellow urine. Ileostomy in place, bag changed 8/18, draining brown liquid stool, output documented in flowsheet. Skin assessment documented, see flowsheet. Ax2 in bed, hoyer lift to chair. Daily vitals. Pills whole. No acute events this shift. Safety maintained. Call bell in reach. Bed alarm on. Hourly rounding continued. Problem: Adult Inpatient Plan of CareGoal: Plan of Care ReviewOutcome: Interventions implemented as appropriateGoal: Patient-Specific Goal (Individualized)Outcome: Interventions implemented as appropriateGoal: Absence of Hospital-Acquired Illness or InjuryOutcome: Interventions implemented as appropriateGoal: Optimal Comfort and WellbeingOutcome: Interventions implemented as appropriateGoal: Readiness for Transition of CareOutcome: Interventions implemented as appropriate Problem: Physical Therapy GoalsGoal: Physical Therapy GoalsDescription: PT GOALS - addressed 07/25/22.1. Patient will demonstrate active range of motion x 4 extremities in preparation for mobility (GOAL MET)2. Caregivers will be independent and safely implement a range of motion/positioning program to prevent loss of range of motion and skin integrity (GOAL MET)3. Patient will perform bed mobility with maximal assist (GOAL MET)4. Patient will tolerate sitting edge of bed >5 minutes with minimal assist (GOAL MET)5. Patient will perform transfers with moderate assist of 2 using rolling walker (ONGOING)6. Pt will perform bed mobility w/ modAx1 (ONGOING)7. Pt will perform STS transfer w/ modAx2 for a standing tolerance of 1 minute. (ONGOING)8. Pt will perform 3x10 BLE LAQs steadily seated EOB (I) (NEW) Outcome: Interventions implemented as appropriate Problem: Occupational Therapy GoalsGoal: Occupational Therapy GoalsDescription: OT goals1. Pt will move to EOB for seated ADL mod I2. Pt will follow 3/4 simple one step motor commands IND3. Pt will sequence through set up ADL task INDOT re-eval, Initial goals not met, continue to progress towards initial goalsRE-EVAL COMPLETED 7/31, GOALS NOT MET CARRYOVER FOR 8/25Outcome: Interventions implemented as appropriate Problem: Impaired Wound HealingGoal: Optimal Wound HealingOutcome: Interventions implemented as appropriate Problem: Fall Injury RiskGoal: Absence of Fall and Fall-Related InjuryOutcome: Interventions implemented as appropriate Problem: Skin Injury Risk IncreasedGoal: Skin Health and IntegrityOutcome: Interventions implemented as appropriate Problem: Mobility ImpairmentGoal: Optimal MobilityOutcome: Interventions implemented as appropriate Problem: Speech Language Pathology GoalsGoal: Dysphagia Goals, SLPDescription: Current goal: Patient will tolerate regular solids and thin liquids without adverse impact on respiratory status and while maintaining adequate oral nutrition.Prior goal (met): The patient will tolerate least restrictive diet without any decline in respiratory status.Prior goal (met): Pt will tolerate pureed diet with thin liquids w/o s/x of aspiration or adverse impact on respiratory status.Outcome: Interventions implemented as appropriate

## 2022-08-09 NOTE — Progress Notes
[facility] Progress NoteSubjective: Hospital Day: 179- Patient endorses some dizziness this morning that resolved with zofranReview of Allergies/Meds/Hx: Allergies: Patient has no known allergies.Medications: Scheduled Meds:Current Facility-Administered Medications Medication Dose Route Frequency Provider Last Rate Last Admin ? diclofenac (VOLTAREN) 1 % gel 4 g  4 g Topical (Top) 4x Daily Larna Daughters, MD   4 g at 08/08/22 0835 ? enoxaparin (LOVENOX) injection 80 mg  80 mg Subcutaneous Q12H Abid, Bushra, MD   80 mg at 08/09/22 0935 ? famotidine (PEPCID) tablet 20 mg  20 mg Oral Daily Senaida Lange, MD   20 mg at 08/09/22 0935 ? ferrous gluconate (FERGON) tablet 324 mg  324 mg Oral Every Other Day Larna Daughters, MD   324 mg at 08/08/22 0835 ? fluticasone propionate (FLONASE) 50 mcg/actuation nasal spray 1 spray  1 spray each nostril Daily Jonna Coup, MD   1 spray at 08/09/22 0936 ? gabapentin (NEURONTIN) capsule 300 mg  300 mg Oral TID Jonna Coup, MD   300 mg at 08/09/22 0935 ? hydrOXYchloroQUINE (PLAQUENIL) tablet 200 mg  200 mg Oral Daily Babette Relic, MD   200 mg at 08/09/22 0935 ? lidocaine 4 % topical patch 1 patch  1 patch Transdermal Q24H Babette Relic, MD   1 patch at 08/08/22 1456 ? melatonin tablet 3 mg  3 mg Oral Nightly Pryor Ochoa, MD   3 mg at 08/08/22 2057 ? nystatin (MYCOSTATIN) powder 1 Application  1 Application  Topical (Top) BID Henriette Combs, MD   1 Application  at 08/08/22 [id number] ? predniSONE (DELTASONE) tablet 3 mg  3 mg Oral Daily Jonna Coup, MD   3 mg at 08/09/22 0935 ? rosuvastatin (CRESTOR) tablet 10 mg  10 mg Per G Tube Nightly Dorisann Frames, MD   10 mg at 08/08/22 2057 Continuous Infusions:PRN Meds:.acetaminophen, dextrose (GLUCOSE) oral liquid 15 g **OR** fruit juice **OR** skim milk, dextrose (GLUCOSE) oral liquid 30 g **OR** fruit juice, dextrose injection, dextrose injection, traMADoLObjective: Vitals:Temp:  [97.8 ?F (36.6 ?C)-97.9 ?F (36.6 ?C)] 97.9 ?F (36.6 ?C)Pulse:  [54-59] 59Resp:  [18-19] 19BP: (99-100)/(63-65) 100/65SpO2:  [96 %-99 %] 99 %Device (Oxygen Therapy): room airWeight change: I/O's:Gross Totals (Last 24 hours) at 08/09/2022 1202Last data filed at 08/08/2022 2241Intake -- Output 500 ml Net -500 ml Physical Exam  unchangedGeneral: Well appearing in no acute distress, alert and oriented to person, place, time and situationCV: Normal rate and regular rhythm, no murmursLung: Clear to auscultation bilaterallyAbd: Soft, non-tender, non-distended. Normoactive bowel sounds. G-tube related fistula dressing in place. R sided ostomy bag in place.Ext: LLE minimal edema, RLE no edema, no new rashesLabs: Recent Labs Lab 08/14/230533 WBC 5.5 HGB 11.5* HCT 36.10 PLT 259  Recent Labs Lab 08/14/230533 NEUTROPHILS 44.8  Recent Labs Lab 08/14/230533 NA 142 K 4.1 CL 110* CO2 23 BUN 13 CREATININE 0.39* GLU 91 ANIONGAP 9  Recent Labs Lab 08/14/230533 CALCIUM 10.3*  No results for input(s): ALT, AST, ALKPHOS, BILITOT, BILIDIR in the last 168 hours. No results for input(s): PTT, LABPROT, INR in the last 168 hours. Microbiology:No new data Diagnostics:XR Knee Left AP Lateral and ObliquesResult Date: 7/26/2023Study: XR KNEE LEFT AP LATERAL AND OBLIQUES.  Indication: New severe left knee pain, hx RA Prior: May 09, 2022. Findings: Significant joint space narrowing, subchondral sclerosis, subchondral cystic change and osteophyte formation is seen within the lateral compartment of the left knee. Trace joint effusion is seen. Findings are similar to the prior exam. There is no acute fracture or dislocation. 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 Trace joint effusion. [facility] Radiology Notify System:  Routine Reported and signed by: Herbie Baltimore, MD  [facility] Radiology and Biomedical Imaging Assessment & Plan: ID: This is Danielle [name] a 66 y.o. female with history of RA, multiple abdominal surgeries, chronic hypercalcemia pending Title 19/long term placement.#DispositionPending state notification to start search for long term care. Appreciate SW assistance.- Consult placed to volunteer services to see if patient can get reading materials and/or iPad for cognitive stimulation#RA#Osteoarthritis#Immobility, contracturesPatient continues on hydroxychloroquine 200mg  daily for RA.- on prednisone 3mg  daily, plan to taper to 2mg  on 8/19- continue voltaren, tylenol, lido patches, ice packs PRN- also has PRN tramadol- gabapentin 300mg  TID for neuropathy- appreciate continued nursing aide to get patient to bedside chair#OrthostasisPatient endorses intermittent periods of room spinning that self resolve or resolve with anti-emetic administration.- Can give zofran if needed for dizziness sensation#HypercalcemiaStable, seen by endocrine in the past, thought to be 2/2 to prolonged immobility vs primary hyperparathyroidism. 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 Robb Matar. Signed:Milad Bublitz Rayvon Char, MDPsychiatry, PGY1Date: 8/19/2023Time: 12:02 PM

## 2022-08-09 NOTE — Plan of Care
Plan of Care Overview/ Patient Status    0700-1900Pt is aox4, vss BP 100/65  - Pulse (!) 59  - Temp 97.9 ?F (36.6 ?C) (Oral)  - Resp 19  - Ht 5' 10 (1.778 m)  - Wt 74.9 kg  - SpO2 99%  - BMI 23.69 kg/m? On room air. C/o mild pain in L foot with repositioning. Ostomy appliance intact,  emptied as needed.Voiding spontaneously, purewick in place draining clear yellow urine. Tolerating regular diet, takes pills whole with water.Bed locked in lowest position, bed alarm on, call bell within reach. Pt calls appropriately for assistance. Hourly rounding continued.

## 2022-08-10 NOTE — Plan of Care
Plan of Care Overview/ Patient Status    0700-1900Pt is aox4, vss BP 104/71  - Pulse 86  - Temp 98 ?F (36.7 ?C) (Oral)  - Resp 20  - Ht 5' 10 (1.778 m)  - Wt 74.9 kg  - SpO2 97%  - BMI 23.69 kg/m? On room air.Ostomy appliance c/d/I, emptied as needed. Voiding spontaneously, purewick in place draining clear yellow urine. Tolerating regular diet, takes pills whole with water.Pt OOB to chair today via hoyer lift.[facility] with [facility] Rayvon Char.Pt c/o L ankle pain after transfer and given PRN tramadol with good effect.Pt also wearing potus boot for ~4 hours.Bed locked in lowest position, bed alarm on, call bell within reach, pt calls appropriately for assistance.

## 2022-08-10 NOTE — Progress Notes
[facility] Progress NoteSubjective: Hospital Day: 180- Patient endorses dizziness waking up- [facility] with Eilene Ghazi of Allergies/Meds/Hx: Allergies: Patient has no known allergies.Medications: Scheduled Meds:Current Facility-Administered Medications Medication Dose Route Frequency Provider Last Rate Last Admin ? diclofenac (VOLTAREN) 1 % gel 4 g  4 g Topical (Top) 4x Daily Larna Daughters, MD   4 g at 08/10/22 1610 ? enoxaparin (LOVENOX) injection 80 mg  80 mg Subcutaneous Q12H Abid, Bushra, MD   80 mg at 08/10/22 0835 ? famotidine (PEPCID) tablet 20 mg  20 mg Oral Daily Senaida Lange, MD   20 mg at 08/10/22 9604 ? ferrous gluconate (FERGON) tablet 324 mg  324 mg Oral Every Other Day Larna Daughters, MD   324 mg at 08/10/22 0834 ? fluticasone propionate (FLONASE) 50 mcg/actuation nasal spray 1 spray  1 spray each nostril Daily Jonna Coup, MD   1 spray at 08/10/22 [phone] ? gabapentin (NEURONTIN) capsule 300 mg  300 mg Oral TID Jonna Coup, MD   300 mg at 08/10/22 8119 ? hydrOXYchloroQUINE (PLAQUENIL) tablet 200 mg  200 mg Oral Daily Babette Relic, MD   200 mg at 08/10/22 1478 ? lidocaine 4 % topical patch 1 patch  1 patch Transdermal Q24H Babette Relic, MD   1 patch at 08/08/22 1456 ? melatonin tablet 3 mg  3 mg Oral Nightly Pryor Ochoa, MD   3 mg at 08/09/22 2038 ? nystatin (MYCOSTATIN) powder 1 Application  1 Application  Topical (Top) BID Henriette Combs, MD   1 Application  at 08/10/22 0848 ? predniSONE (DELTASONE) tablet 3 mg  3 mg Oral Daily Jonna Coup, MD   3 mg at 08/10/22 2956 ? rosuvastatin (CRESTOR) tablet 10 mg  10 mg Per G Tube Nightly Dorisann Frames, MD   10 mg at 08/09/22 2038 Continuous Infusions:PRN Meds:.acetaminophen, dextrose (GLUCOSE) oral liquid 15 g **OR** fruit juice **OR** skim milk, dextrose (GLUCOSE) oral liquid 30 g **OR** fruit juice, dextrose injection, dextrose injection, traMADoLObjective: Vitals:Temp:  [97.3 ?F (36.3 ?C)] 97.3 ?F (36.3 ?C)Pulse:  [57-61] 57Resp:  [20] 20BP: (96-114)/(61-72) 96/61SpO2:  [100 %] 100 %Device (Oxygen Therapy): room airWeight change: I/O's:Gross Totals (Last 24 hours) at 08/10/2022 1150Last data filed at 08/09/2022 2054Intake -- Output 250 ml Net -250 ml Physical Exam  unchangedGeneral: Well appearing in no acute distress, alert and oriented to person, place, time and situationCV: Normal rate and regular rhythm, no murmursLung: Clear to auscultation bilaterallyAbd: Soft, non-tender, non-distended. Normoactive bowel sounds. G-tube related fistula dressing in place. R sided ostomy bag in place.Ext: LLE minimal edema, RLE no edema, no new rashesLabs: Recent Labs Lab 08/14/230533 WBC 5.5 HGB 11.5* HCT 36.10 PLT 259  Recent Labs Lab 08/14/230533 NEUTROPHILS 44.8  Recent Labs Lab 08/14/230533 NA 142 K 4.1 CL 110* CO2 23 BUN 13 CREATININE 0.39* GLU 91 ANIONGAP 9  Recent Labs Lab 08/14/230533 CALCIUM 10.3*  No results for input(s): ALT, AST, ALKPHOS, BILITOT, BILIDIR in the last 168 hours. No results for input(s): PTT, LABPROT, INR in the last 168 hours. Microbiology:No new data Diagnostics:XR Knee Left AP Lateral and ObliquesResult Date: 7/26/2023Study: XR KNEE LEFT AP LATERAL AND OBLIQUES.  Indication: New severe left knee pain, hx RA Prior: May 09, 2022. Findings: Significant joint space narrowing, subchondral sclerosis, subchondral cystic change and osteophyte formation is seen within the lateral compartment of the left knee. Trace joint effusion is seen. Findings are similar to the prior exam. There is no acute fracture or dislocation. Impression: Advanced osteoarthritis of the  lateral compartment of the left knee with genu valgus deformity. Trace joint effusion. [facility] Radiology Notify System:  Routine Reported and signed by: Herbie Baltimore, MD  [facility] Radiology and Biomedical Imaging Assessment & Plan: ID: This is Danielle [name] a 66 y.o. female with history of RA, multiple abdominal surgeries, chronic hypercalcemia pending Title 19/long term placement.#DispositionPending state notification to start search for long term care. Appreciate SW assistance.- Consult placed to volunteer services to see if patient can get reading materials and/or iPad for cognitive stimulation#RA#Osteoarthritis#Immobility, contracturesPatient continues on hydroxychloroquine 200mg  daily for RA.- on prednisone 3mg  daily, plan to taper to 2mg  on 8/19- continue voltaren, tylenol, lido patches, ice packs PRN- also has PRN tramadol- gabapentin 300mg  TID for neuropathy- appreciate continued nursing aide to get patient to bedside chair#OrthostasisPatient endorses intermittent periods of room spinning that self resolve or resolve with anti-emetic administration.- Can give zofran if needed for dizziness sensation#HypercalcemiaStable, seen by endocrine in the past, thought to be 2/2 to prolonged immobility vs primary hyperparathyroidism. 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 Robb Matar. Signed:Mckennon Zwart Rayvon Char, MDPsychiatry, PGY1Date: 8/20/2023Time: 11:50 AM

## 2022-08-10 NOTE — Plan of Care
Plan of Care Overview/ Patient Status    1900-0700: A&Ox4. VSS on RA. Denies pain, N/V at this time. Regular diet, good po intake. Incontinent of urine, purewick in place, draining clear yellow urine. Ileostomy in place, draining as needed, draining brown liquid stool, output documented in flowsheet. Skin assessment documented, see flowsheet. Ax2 in bed, hoyer lift to chair. Daily vitals. Pills whole. No acute events this shift. Safety maintained. Call bell in reach. Bed alarm on. Hourly rounding continued. Problem: Adult Inpatient Plan of CareGoal: Plan of Care ReviewOutcome: Interventions implemented as appropriateGoal: Patient-Specific Goal (Individualized)Outcome: Interventions implemented as appropriateGoal: Absence of Hospital-Acquired Illness or InjuryOutcome: Interventions implemented as appropriateGoal: Optimal Comfort and WellbeingOutcome: Interventions implemented as appropriateGoal: Readiness for Transition of CareOutcome: Interventions implemented as appropriate Problem: Physical Therapy GoalsGoal: Physical Therapy GoalsDescription: PT GOALS - addressed 07/25/22.1. Patient will demonstrate active range of motion x 4 extremities in preparation for mobility (GOAL MET)2. Caregivers will be independent and safely implement a range of motion/positioning program to prevent loss of range of motion and skin integrity (GOAL MET)3. Patient will perform bed mobility with maximal assist (GOAL MET)4. Patient will tolerate sitting edge of bed >5 minutes with minimal assist (GOAL MET)5. Patient will perform transfers with moderate assist of 2 using rolling walker (ONGOING)6. Pt will perform bed mobility w/ modAx1 (ONGOING)7. Pt will perform STS transfer w/ modAx2 for a standing tolerance of 1 minute. (ONGOING)8. Pt will perform 3x10 BLE LAQs steadily seated EOB (I) (NEW) Outcome: Interventions implemented as appropriate Problem: Occupational Therapy GoalsGoal: Occupational Therapy GoalsDescription: OT goals1. Pt will move to EOB for seated ADL mod I2. Pt will follow 3/4 simple one step motor commands IND3. Pt will sequence through set up ADL task INDOT re-eval, Initial goals not met, continue to progress towards initial goalsRE-EVAL COMPLETED 7/31, GOALS NOT MET CARRYOVER FOR 8/25Outcome: Interventions implemented as appropriate Problem: Impaired Wound HealingGoal: Optimal Wound HealingOutcome: Interventions implemented as appropriate Problem: Fall Injury RiskGoal: Absence of Fall and Fall-Related InjuryOutcome: Interventions implemented as appropriate Problem: Skin Injury Risk IncreasedGoal: Skin Health and IntegrityOutcome: Interventions implemented as appropriate Problem: Mobility ImpairmentGoal: Optimal MobilityOutcome: Interventions implemented as appropriate Problem: Speech Language Pathology GoalsGoal: Dysphagia Goals, SLPDescription: Current goal: Patient will tolerate regular solids and thin liquids without adverse impact on respiratory status and while maintaining adequate oral nutrition.Prior goal (met): The patient will tolerate least restrictive diet without any decline in respiratory status.Prior goal (met): Pt will tolerate pureed diet with thin liquids w/o s/x of aspiration or adverse impact on respiratory status.Outcome: Interventions implemented as appropriate

## 2022-08-11 NOTE — Plan of Care
Inpatient Physical Therapy Progress Note IP Adult PT Eval/Treat - 08/11/22 0840    Date of Visit / Treatment  Date of Visit / Treatment 08/11/22   Note Type Progress Note   Progress Report Due 09/24/22   End Time 0840    General Information  Subjective Yesterday I was dizzy.   General Observations Pt received supine in bed on EP 9-7; on RA; NAD; ostomy; purwick; L podus bedside; RN cleared   Precautions/Limitations Fall Precautions;Bed alarm;Chair alarm   Precautions/Limitations Comment Aspiration Prec; WBAT BLE    Weight Bearing Status  Weight Bearing Status Comments WBAT BLE    Vital Signs and Orthostatic Vital Signs  Vital Signs Vital Signs Stable   Vital Signs Free text RA; NAD    Pain/Comfort  Pain Comment (Pre/Post Treatment Pain) Pt c/o 7/10 L knee pain when mobilized, RN aware and repositioned for comfort.    Patient Coping  Observed Emotional State accepting;cooperative   Verbalized Emotional State acceptance    Cognition  Overall Cognitive Status WFL   Orientation Level Oriented X4   Level of Consciousness alert   Following Commands Follows one step commands without difficulty   Personal Safety / Judgment Fall risk;Requires supervision with mobility   Cognition Comments Pleasant and cooperative    Range of Motion  Range of Motion Examination WFL except   Range of Motion Comments Limited terminal hip/knee extension; developing B ankle PF contractures (L>R); limited shoulder elevation R>L)    Manual Muscle Testing  Manual Muscle Testing Comments + Deconditioned    Musculoskeletal  LUE Muscle Strength Grading 3-->active movement against gravity   RUE Muscle Strength Grading --   3-/5  LLE Muscle Strength Grading 2-->active movement with gravity eliminated   RLE Muscle Strength Grading 3-->active movement against gravity    Skin Assessment  Skin Assessment See Nursing Documentation    Posture, Head/Trunk Alignment  Posture, Head/Trunk Alignment Right lateral lean;Retropulsive    Balance  Sitting Balance: Static  FAIR-      Contact Guard to maintain static position with no Assistive Device   Sitting Balance: Dynamic  POOR+   Moves through 1/2 range with minimal assist to right self   Balance Assist Device Hand held assist   Balance Skills Training Comment Ax1; standing deferred d/t profound weakness and altered sensation requiring incr skilled assist    Bed Mobility  Supine-to-Sit Independence/Assistance Level Maximum assist;Assist of 1   Supine-to-Sit Assist Device Hand held assist;Head of bed elevated   Sit-to-Supine Independence/Assistance Level Maximum assist;Assist of 1   Sit-to-Supine Assist Device Hand held assist;Head of bed elevated   Bed Mobility, Impairments ROM decreased;strength decreased;impaired balance;pain   Bed Mobility Comments Assist to trunk and BLE; limited by LLE pain; noted R lateral lean and retropulsion performing therex EOB requiring assist to prevent LOB    Museum/gallery curator Comments Deferred d/t weakness, incoordination, and incr need for skilled assist    Handoff Documentation  Handoff Patient in bed;Bed alarm;Patient instructed to call nursing for mobility;Discussed with nursing   Handoff Comments Boosted w/ RN; donned L podus boot    Endurance  Endurance Comments Fair-    PT- AM-PAC - Basic Mobility Screen- How much help from another person do you currently need.....  Turning from your back to your side while in a a flat bed without using rails? 2 - A Lot - Requires a lot of help (maximum to moderate assistance). Can use assistive devices.   Moving from lying on  your back to sitting on the side of a flat bed without using bed rails? 1 - Total - Requires total assistance or cannot do it at all.   Moving to and from a bed to a chair (including a wheelchair)? 1 - Total - Requires total assistance or cannot do it at all.   Standing up from a chair using your arms(e.g., wheelchair or bedside chair)? 1 - Total - Requires total assistance or cannot do it at all.   To walk in a hospital room? 1 - Total - Requires total assistance or cannot do it at all.   Climbing 3-5 steps with a railing? 1 - Total - Requires total assistance or cannot do it at all.   AMPAC Mobility Score 7   TARGET Highest Level of Mobility Mobility Level 2, Turn self in bed/bed activity/dependent transfer    ACTUAL Highest Level of Mobility Mobility Level 3, Sit on edge of bed    Therapeutic Exercise  Therapeutic Exercise Comments PROM BLE/RUE, A/AAROM LAQs BLE, ankle pumps, crossbody reaches.    Neuromuscular Re-education  Neuromuscular Re-education Comments Seated static/dynamic PC training    Therapeutic Functional Activity  Therapeutic Functional Activity Comments Bed mobility; Pt edu    Orthotic Fabrication/Training  Orthotics Fabrication/Training Comments L Podus boot    Clinical Impression  Follow up Assessment Pt seen for PT f/u and tolerated well. Required maxAx1 supine<>sit w/ noted R lateral lean and retropulsion requiring assist to prevent LOB. tolerates seated EOB therex well consisting of LAQs, ankle pumps, and crossbody reaches. Will benefit from skilled services to improve gross functional ROM/strength, postural control, and activity tolerance. Current PT d/c rec is SNF for LTC.   Criteria for Skilled Therapeutic Interventions Met yes    Patient/Family Stated Goals  Patient/Family Stated Goal(s) return to prior level of functioning;decrease pain;feel better;get stronger    Frequency/Equipment Recommendations  PT Frequency 3x per week   What day of week is next treatment expected? Thursday   8/24  PT/PTA completing this assessment RL   Equipment Needs During Admission/Treatment Hospital bed;Hoyer lift    PT Recommendations for Inpatient Admission Activity/Level of Assist dangle edge of bed;assist of 2   ADL Recommendations mechanical lift;assist of 2   Positioning chair position of the bed;reposition frequently;elevate heels   Therapeutic Exercise ROM as tolerated   Other/Comments Pillow between knees in supine for contracture management and prevention of skin breakdown; encourage B knee extension in supine; encourage B podus boots or shorten bed or foam to maintain 0 deg of ankle DF    Planned Treatment / Interventions  Plan for Next Visit Progress as tol   Training Treatment / Interventions Balance / Investment banker, operational;Endurance Training;Functional Personnel officer / Interventions Patient Education / Training    PT Discharge Summary  Physical Therapy Disposition Recommendation SNF for long term placement     Shanon Payor PT, DPTMHB: [phone]

## 2022-08-11 NOTE — Plan of Care
Plan of Care Overview/ Patient Status    1900-0700: A/o x4, forgetful, VSS on room air. Denies pain or discomfort at this time.  Denies SOB/chest pain. ostomy in place, CDI, purewick in place, incontinence care provided as needed. heels offloaded, please see flowsheet for full skin assessment. Pills whole. Assist x 2 in bed. T & R Q2 hours. Hourly rounding completed, safety maintained. Call bell within reach

## 2022-08-11 NOTE — Progress Notes
[facility] Hospital[facility] Progress NoteSubjective: Hospital Day: 181- Patient endorses dizziness waking up- Endorsing constant pain in L knee and ankleReview of Allergies/Meds/Hx: Allergies: Patient has no known allergies.Medications: Scheduled Meds:Current Facility-Administered Medications Medication Dose Route Frequency Provider Last Rate Last Admin ? diclofenac (VOLTAREN) 1 % gel 4 g  4 g Topical (Top) 4x Daily Larna Daughters, MD   4 g at 08/11/22 0941 ? enoxaparin (LOVENOX) injection 80 mg  80 mg Subcutaneous Q12H Abid, Bushra, MD   80 mg at 08/11/22 0942 ? famotidine (PEPCID) tablet 20 mg  20 mg Oral Daily Senaida Lange, MD   20 mg at 08/11/22 0943 ? ferrous gluconate (FERGON) tablet 324 mg  324 mg Oral Every Other Day Larna Daughters, MD   324 mg at 08/10/22 0834 ? fluticasone propionate (FLONASE) 50 mcg/actuation nasal spray 1 spray  1 spray each nostril Daily Jonna Coup, MD   1 spray at 08/11/22 0941 ? gabapentin (NEURONTIN) capsule 300 mg  300 mg Oral TID Jonna Coup, MD   300 mg at 08/11/22 1610 ? hydrOXYchloroQUINE (PLAQUENIL) tablet 200 mg  200 mg Oral Daily Babette Relic, MD   200 mg at 08/11/22 9604 ? lidocaine 4 % topical patch 1 patch  1 patch Transdermal Q24H Babette Relic, MD   1 patch at 08/08/22 1456 ? melatonin tablet 3 mg  3 mg Oral Nightly Pryor Ochoa, MD   3 mg at 08/10/22 2204 ? nystatin (MYCOSTATIN) powder 1 Application  1 Application  Topical (Top) BID Henriette Combs, MD   1 Application  at 08/10/22 0848 ? predniSONE (DELTASONE) tablet 3 mg  3 mg Oral Daily Jonna Coup, MD   3 mg at 08/11/22 5409 ? rosuvastatin (CRESTOR) tablet 10 mg  10 mg Per G Tube Nightly Dorisann Frames, MD   10 mg at 08/10/22 2204 Continuous Infusions:PRN Meds:.acetaminophen, dextrose (GLUCOSE) oral liquid 15 g **OR** fruit juice **OR** skim milk, dextrose (GLUCOSE) oral liquid 30 g **OR** fruit juice, dextrose injection, dextrose injection, traMADoLObjective: Vitals:Temp:  [97.7 ?F (36.5 ?C)-98 ?F (36.7 ?C)] 97.7 ?F (36.5 ?C)Pulse:  [62-86] 62Resp:  [16-20] 16BP: (81-104)/(39-71) 93/58SpO2:  [96 %-98 %] 98 %Device (Oxygen Therapy): room airWeight change: I/O's:Gross Totals (Last 24 hours) at 08/11/2022 1128Last data filed at 08/11/2022 0100Intake -- Output 850 ml Net -850 ml Physical ExamGeneral: Well appearing in no acute distress, alert and oriented to person, place, time and situationCV: Normal rate and regular rhythm, no murmursLung: Clear to auscultation bilaterallyAbd: Soft, non-tender, non-distended. Normoactive bowel sounds. G-tube related fistula dressing in place. R sided ostomy bag in place.Ext: LLE minimal edema, RLE no edema, no new rashes, full ROM in R knee and ankle, limited ROM in L knee and ankle - ankle remains in low tone plantar flexion with everted ankle with ROM limited by pain to dorsiflexionLabs: No results for input(s): WBC, HGB, HCT, PLT in the last 168 hours. No results for input(s): NEUTROPHILS, LABLYMP, LABEOS, BANDSP in the last 168 hours. No results for input(s): NA, K, CL, CO2, BUN, CREATININE, GLU, ANIONGAP in the last 168 hours. No results for input(s): CALCIUM, MG, PHOS in the last 168 hours. No results for input(s): ALT, AST, ALKPHOS, BILITOT, BILIDIR in the last 168 hours. No results for input(s): PTT, LABPROT, INR in the last 168 hours. Microbiology:No new data Diagnostics:XR Knee Left AP Lateral and ObliquesResult Date: 7/26/2023Study: XR KNEE LEFT AP LATERAL AND OBLIQUES.  Indication: New severe left knee pain, hx RA Prior: May 09, 2022. Findings: Significant joint space narrowing,  subchondral sclerosis, subchondral cystic change and osteophyte formation is seen within the lateral compartment of the left knee. Trace joint effusion is seen. Findings are similar to the prior exam. There is no acute fracture or dislocation. 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 Trace joint effusion. [facility] Radiology Notify System:  Routine Reported and signed by: Herbie Baltimore, MD  [facility] Radiology and Biomedical Imaging Assessment & Plan: ID: This is Danielle [name] a 66 y.o. female with history of RA, multiple abdominal surgeries, chronic hypercalcemia pending Title 19/long term placement.#DispositionPending state notification to start search for long term care. Appreciate SW assistance.- Consult placed to volunteer services to see if patient can get reading materials and/or iPad for cognitive stimulation- Appreciate staff [facility]#RA#Osteoarthritis#Immobility, contractures, L knee and ankle painPatient continues on hydroxychloroquine 200mg  daily for RA.- on prednisone 3mg  daily, plan to taper to 2mg  on 8/19- continue voltaren, tylenol, lido patches, ice packs PRN- also has PRN tramadol- gabapentin 300mg  TID for neuropathy- appreciate continued nursing aide to get patient to bedside chair- appreciate continued PT work#OrthostasisPatient endorses intermittent periods of room spinning that self resolve or resolve with anti-emetic administration.- Can give zofran if needed for dizziness sensation#HypercalcemiaStable, seen by endocrine in the past, thought to be 2/2 to prolonged immobility vs primary hyperparathyroidism. 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 Robb Matar. Signed:Vivienne Sangiovanni Rayvon Char, MDPsychiatry, PGY1Date: 8/21/2023Time: 11:28 AM

## 2022-08-11 NOTE — Care Coordination-Inpatient
Follow up call to patient Leanna Battles DDS rep supervisor Carola Frost ph#[phone] she informed this writer that all of the necessary documents have been submitted to DSS and LTC will now be approved once an accepting facility is identified for LTC. This Clinical research associate spoke with patient Becklund bedside to inform her of this update patient Tavani was very please with this development in her case she has provided this Clinical research associate permission to re-send referral for LTC in the [facility] area referrals have been sent for review we are currently awaiting responses.Care management will continue to follow along with the patient's medical team as needed.Troy JonesTransition CoordinatorCare Management ([facility])Ph#[phone]#[phone]#[phone]

## 2022-08-12 MED ORDER — PREDNISONE 1 MG TABLET
1 mg | Freq: Every day | ORAL | Status: DC
Start: 2022-08-12 — End: 2022-08-15
  Administered 2022-08-13 – 2022-08-14 (×2): 1 mg via ORAL

## 2022-08-12 NOTE — Plan of Care
Plan of Care Overview/ Patient Status    0700-1900Patient is A&Ox4. Pt is ver pleasant. Soft BP but asymptomatic. Otherwise VSS on RA. No complaints of pain, SOB, N/V/D. Assist x2 in bed. Purewick in place with yellow urine output. Ostomy in place, +stool/flatus. Ostomy bag changed this shift. Dressing on abdomen changed this shift.Good po intake. Swallows pills whole. All scheduled medications administered as ordered. Q2hr T&R as pt allowed. Left foot boot in place. Pt worked with PT this morning.Safety maintained, hourly rounding performed, call bell within reach, bed alarm on. Will continue to follow POC.

## 2022-08-12 NOTE — Plan of Care
Plan of Care Overview/ Patient Status    1900-0700: A/o x4, forgetful, VSS on room air. Denies pain or discomfort at this time.  Denies SOB/chest pain. ostomy in place, CDI, purewick in place, incontinence care provided as needed. heels offloaded, please see flowsheet for full skin assessment. Pills whole. Assist x 2 in bed. T & R Q2 hours. Hourly rounding completed, safety maintained. Call bell within reach

## 2022-08-12 NOTE — Plan of Care
Problem: Adult Inpatient Plan of CareGoal: Plan of Care Review8/22/2023 1718 by Dimitri Ped C, RNOutcome: Interventions implemented as appropriate8/22/2023 1718 by Dimitri Ped C, RNOutcome: Interventions implemented as appropriateFlowsheets (Taken 08/12/2022 1718)Progress: improvingPlan of Care Reviewed With: patient8/22/2023 1717 by Dimitri Ped C, RNOutcome: Interventions implemented as appropriate Problem: Adult Inpatient Plan of CareGoal: Patient-Specific Goal (Individualized)08/12/2022 1718 by Dimitri Ped C, RNOutcome: Interventions implemented as appropriateFlowsheets (Taken 08/12/2022 1200)What Anxieties, Fears, Concerns or Questions Do You Have About Your Care?: none statedIndividualized Preferences and Care Needs: per pocPatient Centered Long Term Goal: bed placement for dischargePatient Centered Daily Goal: safety8/22/2023 1718 by Dimitri Ped C, RNOutcome: Interventions implemented as appropriate8/22/2023 1717 by Dimitri Ped C, RNOutcome: Interventions implemented as appropriate Plan of Care Overview/ Patient Status    Assumed care 0700-1900. VSS RA. A&Ox4, was not forgetful during shift.  No reported pain. ileostomy bag intact, + stool output. Purewick in place, patent, draining yellow urine. Pills consumed whole, all medications tolerated well. Incentive spirometer on bedside table, encouraged use 10/h. Resistance bands at head and foot of bed, encouraged use and active ROM bilateral U&LE. Turn & reposition q2h. Safety measures maintained. Hourly rounding. Report given to PM RN.

## 2022-08-13 NOTE — Care Coordination-Inpatient
Spoke with patient Canner at the bedside and her POA [name] via [phone] informing them both of the offers for STR to LTC they have both informed this Clinical research associate that they will accept the offer with [facility] and rehab.The liaison for the facility Victorino Dike has been in contact with both patient and POA to address any question or concerns they may have. Auth / denial has been initiated we are currently awaiting a response.Care management will continue to follow along with the patient's medical team as needed.Troy JonesTransition CoordinatorCare Management ([facility])Ph#[phone]#[phone]#[phone]

## 2022-08-13 NOTE — Care Coordination-Inpatient
PENDING INSURANCE AUTHORIZATION THIS PATIENT CANNOT BE DISCHARGED UNTIL DETERMINATION OBTAINEDSecured facility: [facility]kley Harvey has been initiated with this patient's insurer. Clinical information submitted to this patient's insurer for review today. We now await the authorization that will allow this patient to transfer to the facility.   Insurance Contact: Aetna/AvailityPhone: Fax: PortalReference No.: [id number] [employer] CoordinatorYNHH, St. Raphael's CampusTel: [phone]: [phone] [id number]: [phone]

## 2022-08-13 NOTE — Plan of Care
Danielle [name]					Location: EP [id number] y.o., female				Attending: Daryll Drown, MD	Admit Date: 02/11/2022			[name] LOS: 183 days FOLLOW-UP NUTRITION ASSESSMENT Dietary Orders (From admission, onward)     Start     Ordered  07/25/22 1408  Nutrition Supplements  (Nutrition Supplement Panel)  Once      Question Answer Comment Adult Supplements: Ensure Clear Danielle [name] [facility])  Supplement Frequency: Daily    07/25/22 1407  06/20/22 1045  Diet Regular  DIET EFFECTIVE NOW      Question:  Initiate Nutrition Management Protocol (Yes/No?)  Answer:  Yes - Initiate Protocol  06/20/22 1045    No Known AllergiesANTHROPOMETRICS:Height:?70Admit wt:?79.4?kg?(per SNF documentation)Current wt: 74.9?kg (bed,?8/14); BMI: 23.44?kg/m?Dosing weight:?73.5?kg?(based on?wt taken 7/4)??Additional wt information:Ht confirmed?with pt. No wt hx since 2019 per EMR. Wts have fluctuated since admit, likely r/t scale use vs fluid shifts vs some true wt change. Current wt down?4.5?kg (5.7%) from admit wt x 6 months, not significant for timeframe.?Noted?1-2+ edema per RN flowsheets. Moderate muscle wasting visualized in clavicle and temple region. Moderate fat wasting visualized in buccal and orbital regions.?Will continue?utilize 73.5 kg as dosing wt d/t frequent fluctuations over admission. Will monitor trends?as available.?  Wt: 08/04/22 74.9 kg 07/28/22 73.2 kg 07/09/22 71.6 kg 06/24/22 73.5 kg 12/13/18 98.6 kg ?ESTIMATED NUTRITION REQUIREMENTS:Kcal/day:?1838???????????(25?kcal/kg)Protein/day:?74-88?????(1-1.2?gm/kg)Fluids/day:?1838?mL/d?(1 mL/kcal) ONLY as medical and fluid status allows and only per team discretion.Needs based on:?dosing wt (73.5 kg), maintenance, steroid?NUTRITION ASSESSMENT:?PT EMR reviewed for follow up assessment. Met with pt at bedside. Reports stable po intake over the past week eating at least 75%-100% of three meals and one Ensure Clear daily. Continues to prioritize protein at meals. Voices menu fatigue. Noted PO intake 100% at documented meals. Meds and labs reviewed. Pt likely meeting nutrition targets, but would benefit from continued encouragement to consume high protein meals/snacks including Ensure Clear.??Cultural/Religious/Ethnic Needs:?NoFood Allergies: None per pt?NUTRITION DIAGNOSIS:Severe malnutrition r/t depletion aeb moderate muscle and fat depletion.?-- improving, continues?INTERVENTIONS/RECOMMENDATIONS:?Meals and Snacks:- Continue?Regular diet- Encourage small frequent meals/snacks- Encourage high kcal/protein foods- Encouraged patient to ask diet tech for creative meal options when placing meal order?Goal:- Pt will consume >/=1 protein rich food with each meal- ?met,?continue?Nutrition Supplement Therapy:-?Continue Ensure Clear Berry?daily per pt request??Goal:?-?Pt to consume 1 Ensure Clear daily?-?met, continue ?Discharge Planning and Transfer of Nutrition Care:??Nutrition related discharge needs still being determined at this time, will continue to follow?MONITORING / EVALUATION:?Food/Nutrition-Related History:Food and Nutrient Intake?Anthropometric Measurements:Weight ?Biochemical Data, Medical Tests and Procedures:Electrolyte and renal profileGlucose/endocrine profile?Nutrition-Focus Physical FindingsOverall findingsElectronically signed by Sherrilee Gilles, RD, August 23, 2023MHB- [phone] Please note: Nutrition is a consult only service on weekends and holidays.  Please enter a consult in EPIC if assistance is needed on a weekend or holiday or via [facility] [phone] to contact the covering RD.

## 2022-08-13 NOTE — Plan of Care
Plan of Care Overview/ Patient Status    0700-1900Pt A&Ox 4, VSS on RA, denies chest pain/ SOB. ileostomy bag changed this shift, +stool/ flatus. Pure wick in place draining clear yellow urine. Pills whole. Hourly rounding. Safety precautions maintained. Bed in lowest position. Bed alarm on. Call bell within reach. Will continue to monitor per poc.

## 2022-08-13 NOTE — Progress Notes
[facility] Progress NoteSubjective: Hospital Day: 182- feeling very well, does not endorse any new symptoms- did not report L ankle or leg pain- in good spirits, joking with LTC coordinator at bedside Objective: Vitals:Temp:  [97.3 ?F (36.3 ?C)-98.1 ?F (36.7 ?C)] 97.3 ?F (36.3 ?C)Pulse:  [58-70] 58Resp:  [16] 16BP: (87-106)/(57-68) 106/65SpO2:  [97 %-99 %] 99 %Device (Oxygen Therapy): room airWeight change: I/O's:Gross Totals (Last 24 hours) at 08/12/2022 1305Last data filed at 08/12/2022 1200Intake -- Output 1600 ml Net -1600 ml Physical exam:NAD, sitting comfortably in bed, speaking in full sentences. RR, no MRGs. Lungs clear. Abdomen soft, ntnd. No edema. Labs:None todayMicrobiology:No new data Diagnostics:NoneAssessment & Plan: ID: This is Danielle [name] a 66 y.o. female with history of RA, multiple abdominal surgeries, chronic hypercalcemia pending Title 19/long term placement.#DispositionPending state notification to start search for long term care. Appreciate SW assistance.- Consult placed to volunteer services to see if patient can get reading materials and/or iPad for cognitive stimulation- Appreciate staff [facility]#RA#Osteoarthritis#Immobility, contractures, L knee and ankle painPatient continues on hydroxychloroquine 200mg  daily for RA.- on prednisone, taper to 2mg  today- continue voltaren, tylenol, lido patches, ice packs PRN- discontinue tramadol prn given unfavorable risk/benefit ratio- gabapentin 300mg  TID for neuropathy- appreciate continued PT workDIET: regularCODE: FULLDVT: lovenox therapeuticLINES/FOLEY: nonePT/OT: hoyer dependentDISPO: pending LTCAll information was discussed with the Attending Physician, Dr. Robb Matar. Signed:Taraoluwa Thakur Palma Holter, [facility] Internal MedicinePreferred contact: M[facility]8/22/2023

## 2022-08-13 NOTE — Progress Notes
[facility] Hospital[facility] Progress NoteSubjective: Hospital Day: 183- in good spirits about progression in placement update this AM- still endorsing L knee and ankle painObjective: Vitals:Temp:  [97.5 ?F (36.4 ?C)-98.4 ?F (36.9 ?C)] 98.4 ?F (36.9 ?C)Pulse:  [71-89] 71Resp:  [18-20] 20BP: (91-111)/(57-70) 111/62SpO2:  [95 %-97 %] 95 %Device (Oxygen Therapy): room airWeight change: I/O's:Gross Totals (Last 24 hours) at 08/13/2022 1328Last data filed at 08/13/2022 0901Intake 190 ml Output 625 ml Net -435 ml Physical exam:NAD, sitting comfortably in bed, speaking in full sentences. RR, no MRGs. Lungs clear. Abdomen soft, ntnd. No edema. Pain Labs:None todayMicrobiology:No new data Diagnostics:NoneAssessment & Plan: ID: This is Danielle [name] a 66 y.o. female with history of RA, multiple abdominal surgeries, chronic hypercalcemia pending Title 19/long term placement.#DispositionPending state notification to start search for long term care. Appreciate SW assistance.- Consult placed to volunteer services to see if patient can get reading materials and/or iPad for cognitive stimulation- Appreciate [facility]- update from CM that patient has secured bed, pending auth#RA#Osteoarthritis#Immobility, contractures, L knee and ankle painPatient continues on hydroxychloroquine 200mg  daily for RA.- continue 2mg  prednisone- continue voltaren, tylenol, lido patches, ice packs PRN- gabapentin 300mg  TID for neuropathy- appreciate continued PT workDIET: regularCODE: FULLDVT: lovenox therapeuticLINES/FOLEY: nonePT/OT: hoyer dependentDISPO: pending LTCAll information was discussed with the Attending Physician, Dr. Robb Matar. Signed:Bricia Taher Rayvon [name], MDPsychiatry, PGY1Preferred contact: M[facility]8/23/2023

## 2022-08-13 NOTE — Plan of Care
Plan of Care Overview/ Patient Status    1900 - 0700 A&Ox4. Resting in bed overnight. Denied pain, but some pain obvious to LLE w/positioning. Scheduled analgesics continued. Ostomy intact. Purewick in place draining clear, yellow urine. Appeared to rest comfortably overnight. Eager for discharge. Hourly rounding performed and safety maintained. Please continue to follow POC. See flow sheets for complete assessment. Problem: Adult Inpatient Plan of CareGoal: Plan of Care ReviewOutcome: Interventions implemented as appropriateFlowsheets (Taken 08/13/2022 0142)Progress: no changePlan of Care Reviewed With: patient

## 2022-08-14 DIAGNOSIS — G8929 Other chronic pain: Secondary | ICD-10-CM

## 2022-08-14 DIAGNOSIS — Z751 Person awaiting admission to adequate facility elsewhere: Secondary | ICD-10-CM

## 2022-08-14 DIAGNOSIS — G9389 Other specified disorders of brain: Secondary | ICD-10-CM

## 2022-08-14 DIAGNOSIS — Z20822 Contact with and (suspected) exposure to covid-19: Secondary | ICD-10-CM

## 2022-08-14 DIAGNOSIS — M3500 Sicca syndrome, unspecified: Secondary | ICD-10-CM

## 2022-08-14 DIAGNOSIS — Z794 Long term (current) use of insulin: Secondary | ICD-10-CM

## 2022-08-14 DIAGNOSIS — K9423 Gastrostomy malfunction: Secondary | ICD-10-CM

## 2022-08-14 DIAGNOSIS — M170 Bilateral primary osteoarthritis of knee: Secondary | ICD-10-CM

## 2022-08-14 DIAGNOSIS — M47812 Spondylosis without myelopathy or radiculopathy, cervical region: Secondary | ICD-10-CM

## 2022-08-14 DIAGNOSIS — R627 Adult failure to thrive: Secondary | ICD-10-CM

## 2022-08-14 DIAGNOSIS — R1310 Dysphagia, unspecified: Secondary | ICD-10-CM

## 2022-08-14 DIAGNOSIS — K760 Fatty (change of) liver, not elsewhere classified: Secondary | ICD-10-CM

## 2022-08-14 DIAGNOSIS — E870 Hyperosmolality and hypernatremia: Secondary | ICD-10-CM

## 2022-08-14 DIAGNOSIS — L299 Pruritus, unspecified: Secondary | ICD-10-CM

## 2022-08-14 DIAGNOSIS — M059 Rheumatoid arthritis with rheumatoid factor, unspecified: Secondary | ICD-10-CM

## 2022-08-14 DIAGNOSIS — E43 Unspecified severe protein-calorie malnutrition: Secondary | ICD-10-CM

## 2022-08-14 DIAGNOSIS — Z87440 Personal history of urinary (tract) infections: Secondary | ICD-10-CM

## 2022-08-14 DIAGNOSIS — E876 Hypokalemia: Secondary | ICD-10-CM

## 2022-08-14 DIAGNOSIS — K719 Toxic liver disease, unspecified: Secondary | ICD-10-CM

## 2022-08-14 DIAGNOSIS — E785 Hyperlipidemia, unspecified: Secondary | ICD-10-CM

## 2022-08-14 DIAGNOSIS — L03115 Cellulitis of right lower limb: Secondary | ICD-10-CM

## 2022-08-14 DIAGNOSIS — R6510 Systemic inflammatory response syndrome (SIRS) of non-infectious origin without acute organ dysfunction: Secondary | ICD-10-CM

## 2022-08-14 DIAGNOSIS — E860 Dehydration: Secondary | ICD-10-CM

## 2022-08-14 DIAGNOSIS — Z9049 Acquired absence of other specified parts of digestive tract: Secondary | ICD-10-CM

## 2022-08-14 DIAGNOSIS — M069 Rheumatoid arthritis, unspecified: Secondary | ICD-10-CM

## 2022-08-14 DIAGNOSIS — E538 Deficiency of other specified B group vitamins: Secondary | ICD-10-CM

## 2022-08-14 DIAGNOSIS — R32 Unspecified urinary incontinence: Secondary | ICD-10-CM

## 2022-08-14 DIAGNOSIS — K117 Disturbances of salivary secretion: Secondary | ICD-10-CM

## 2022-08-14 DIAGNOSIS — G9341 Metabolic encephalopathy: Secondary | ICD-10-CM

## 2022-08-14 DIAGNOSIS — Z79631 Long term (current) use of antimetabolite agent: Secondary | ICD-10-CM

## 2022-08-14 DIAGNOSIS — I959 Hypotension, unspecified: Secondary | ICD-10-CM

## 2022-08-14 DIAGNOSIS — Z79899 Other long term (current) drug therapy: Secondary | ICD-10-CM

## 2022-08-14 DIAGNOSIS — M659 Synovitis and tenosynovitis, unspecified: Secondary | ICD-10-CM

## 2022-08-14 DIAGNOSIS — Z933 Colostomy status: Secondary | ICD-10-CM

## 2022-08-14 DIAGNOSIS — Z86718 Personal history of other venous thrombosis and embolism: Secondary | ICD-10-CM

## 2022-08-14 DIAGNOSIS — M25512 Pain in left shoulder: Secondary | ICD-10-CM

## 2022-08-14 DIAGNOSIS — D509 Iron deficiency anemia, unspecified: Secondary | ICD-10-CM

## 2022-08-14 DIAGNOSIS — G629 Polyneuropathy, unspecified: Secondary | ICD-10-CM

## 2022-08-14 DIAGNOSIS — N390 Urinary tract infection, site not specified: Secondary | ICD-10-CM

## 2022-08-14 MED ORDER — FLUTICASONE PROPIONATE 50 MCG/ACTUATION NASAL SPRAY,SUSPENSION
50 mcg/actuation | Freq: Every day | NASAL | 12 refills | Status: AC
Start: 2022-08-14 — End: ?

## 2022-08-14 MED ORDER — LIDOCAINE 4 % TOPICAL PATCH
4 % | MEDICATED_PATCH | TRANSDERMAL | Status: AC
Start: 2022-08-14 — End: ?

## 2022-08-14 MED ORDER — PREDNISONE 1 MG TABLET
1 mg | Freq: Every day | ORAL | Status: AC
Start: 2022-08-14 — End: ?

## 2022-08-14 MED ORDER — DICLOFENAC 1 % TOPICAL GEL
1 % | Freq: Four times a day (QID) | TOPICAL | 3 refills | Status: AC
Start: 2022-08-14 — End: ?

## 2022-08-14 MED ORDER — ONDANSETRON 4 MG DISINTEGRATING TABLET
4 mg | ORAL_TABLET | Freq: Three times a day (TID) | 1 refills | Status: AC | PRN
Start: 2022-08-14 — End: ?

## 2022-08-14 MED ORDER — GABAPENTIN 300 MG CAPSULE
300 mg | Freq: Three times a day (TID) | ORAL | Status: AC
Start: 2022-08-14 — End: ?

## 2022-08-14 MED ORDER — HYDROXYCHLOROQUINE 200 MG TABLET
200 mg | ORAL_TABLET | Freq: Every day | ORAL | 1 refills | Status: AC
Start: 2022-08-14 — End: ?

## 2022-08-14 MED ORDER — NYSTATIN 100,000 UNIT/GRAM TOPICAL POWDER
100000 unit/gram | Freq: Two times a day (BID) | TOPICAL | Status: AC
Start: 2022-08-14 — End: ?

## 2022-08-14 MED ORDER — ACETAMINOPHEN 325 MG TABLET
325 mg | ORAL_TABLET | Freq: Four times a day (QID) | ORAL | 12 refills | Status: AC | PRN
Start: 2022-08-14 — End: ?

## 2022-08-14 MED ORDER — MELATONIN 3 MG TABLET
3 mg | Freq: Every evening | ORAL | 1 refills | Status: AC
Start: 2022-08-14 — End: ?

## 2022-08-14 MED ORDER — FERROUS GLUCONATE 324 MG (38 MG IRON) TABLET
324 mg (38 mg iron) | ORAL | Status: AC
Start: 2022-08-14 — End: ?

## 2022-08-14 MED ORDER — FAMOTIDINE 20 MG TABLET
20 mg | Freq: Every day | ORAL | Status: AC
Start: 2022-08-14 — End: ?

## 2022-08-14 NOTE — Plan of Care
Plan of Care Overview/ Patient Status    Per MD, pt is medically cleared to transfer to [facility].  Insurance Berkley Harvey was approved as well as Scientist, physiological.  Facility asks that ostomy supplies be sent along with pt.  MD and RN aware of this need.  RN provided number to call report.  Pt will need stretcher transport set for 3pm.  CM spoke with pt and sister, [name] and they are both agreeable to this plan.Case Management Plan  Flowsheet Row Most Recent Value Discharge Planning  Patient/Patient Representative was presented with a list of facilities, agencies and/or dme providers and Referral(s) placed for: Short term rehabilitation (at a nursing facility) Bed has been secured and is available on: 08/14/22 Facility Name [facility] Mode of Transportation  Ambulance (add comment for special considerations) Transportation scheduled for (date)  08/14/22 Transportation Company [facility] Designated Discharge Caregiver 1  Name designated caregiver [name] Phone number designated caregiver [phone] Relationship Patient Designated Caregiver Sister CM D/C Readiness  PASRR completed and approved Yes Authorization number obtained, if required Yes Is there a 3 day INPATIENT Qualifying stay for Medicare Patients? Yes Medicare IM- signed, dated, timed and scanned, if required Yes DME Authorized/Delivered N/A No needs identified/ follow up with PCP/MD N/A Post acute care services secured W10 complete Yes Pri Completed and Accepted  N/A Is the destination address correct on the W10 Yes Finalized Plan  Expected Discharge Date 08/14/22 Discharge Disposition Home or Self Care  Allyne Gee, MSW/CMCare ManagementYale Valley [facility] [phone]

## 2022-08-14 NOTE — Plan of Care
Plan of Care Overview/ Patient Status    AOX 4, denies pain/SOB/dizziness/nausea. Tolerating reg diet.  Colostomy with good output. Colo- Supplies ordered and sent with patient to [facility].  Report called to [facility], RN, all questions answered to her satisfaction.  Transport pick up @ 1840.  Denies questions or concerns regarding f/u, medications, or hospitalization.  Leaves in pleasant disposition.Problem: Adult Inpatient Plan of CareGoal: Plan of Care ReviewOutcome: Interventions implemented as appropriateGoal: Patient-Specific Goal (Individualized)Outcome: Interventions implemented as appropriateGoal: Absence of Hospital-Acquired Illness or InjuryOutcome: Interventions implemented as appropriateGoal: Optimal Comfort and WellbeingOutcome: Interventions implemented as appropriateGoal: Readiness for Transition of CareOutcome: Interventions implemented as appropriate Problem: Physical Therapy GoalsGoal: Physical Therapy GoalsDescription: PT GOALS - addressed 07/25/22.1. Patient will demonstrate active range of motion x 4 extremities in preparation for mobility (GOAL MET)2. Caregivers will be independent and safely implement a range of motion/positioning program to prevent loss of range of motion and skin integrity (GOAL MET)3. Patient will perform bed mobility with maximal assist (GOAL MET)4. Patient will tolerate sitting edge of bed >5 minutes with minimal assist (GOAL MET)5. Patient will perform transfers with moderate assist of 2 using rolling walker (ONGOING)6. Pt will perform bed mobility w/ modAx1 (ONGOING)7. Pt will perform STS transfer w/ modAx2 for a standing tolerance of 1 minute. (ONGOING)8. Pt will perform 3x10 BLE LAQs steadily seated EOB (I) (NEW) Outcome: Interventions implemented as appropriate Problem: Occupational Therapy GoalsGoal: Occupational Therapy GoalsDescription: OT goals1. Pt will move to EOB for seated ADL mod I2. Pt will follow 3/4 simple one step motor commands IND3. Pt will sequence through set up ADL task INDOT re-eval, Initial goals not met, continue to progress towards initial goalsRE-EVAL COMPLETED 7/31, GOALS NOT MET CARRYOVER FOR 8/25Outcome: Interventions implemented as appropriate Problem: Impaired Wound HealingGoal: Optimal Wound HealingOutcome: Interventions implemented as appropriate Problem: Fall Injury RiskGoal: Absence of Fall and Fall-Related InjuryOutcome: Interventions implemented as appropriate Problem: Skin Injury Risk IncreasedGoal: Skin Health and IntegrityOutcome: Interventions implemented as appropriate Problem: Mobility ImpairmentGoal: Optimal MobilityOutcome: Interventions implemented as appropriate Problem: Speech Language Pathology GoalsGoal: Dysphagia Goals, SLPDescription: Current goal: Patient will tolerate regular solids and thin liquids without adverse impact on respiratory status and while maintaining adequate oral nutrition.Prior goal (met): The patient will tolerate least restrictive diet without any decline in respiratory status.Prior goal (met): Pt will tolerate pureed diet with thin liquids w/o s/x of aspiration or adverse impact on respiratory status.Outcome: Interventions implemented as appropriate Problem: Range of Motion ImpairmentGoal: Optimal Range of MotionOutcome: Interventions implemented as appropriate Problem: IleostomyGoal: Optimal CopingOutcome: Interventions implemented as appropriateGoal: Absence of BleedingOutcome: Interventions implemented as appropriateGoal: Fluid, Electrolyte and Nutrition BalanceOutcome: Interventions implemented as appropriateGoal: Absence of Infection Signs and SymptomsOutcome: Interventions implemented as appropriateGoal: Anesthesia/Sedation RecoveryOutcome: Interventions implemented as appropriateGoal: Acceptable Pain ControlOutcome: Interventions implemented as appropriateGoal: Nausea and Vomiting ReliefOutcome: Interventions implemented as appropriateGoal: Optimal Stoma HealingOutcome: Interventions implemented as appropriateGoal: Effective Urinary EliminationOutcome: Interventions implemented as appropriateGoal: Effective Oxygenation and VentilationOutcome: Interventions implemented as appropriate

## 2022-08-14 NOTE — Care Coordination-Inpatient
INSURANCE AUTH OBTAINED FOR STR 08/13/22 1012 Authorization Information Date Authorization Initiated:  08/13/22 Time Authorization Initiated: 1012 Mode Clinical was sent: Portal Facility Name [facility] Facility Authorization yes Insurance Company [place] [employer] [name] [employer] Contact Name/# [employer]-Angie- Facility Authorization #/Details # [id number],8/24-8/30 Date Authorization recieved 08/14/22 Time Authorization recieved 0815 Follow up contact necessary Yes Contact Name Rexene Alberts phone: [phone] [facility] NilanTransition CoordinatorCare Management DepartmentY[facility]-New [facility] [place], [place] Phone: [phone]

## 2022-08-14 NOTE — Plan of Care
Plan of Care Overview/ Patient Status    1900 - 0700 A&Ox4. Resting in bed overnight. Reported no LLE pain at rest, but mild/moderate w/activity. Scheduled analgesics continued. Ostomy and purewick intact. Appeared to rest comfortably overnight. Pt is eager to discharge today to [facility], hopeful for rehab and mobility. Pt is asking staff for work recommendations. Hourly rounding performed and safety maintained. Please continue to follow POC. See flow sheets for complete assessment. Problem: Adult Inpatient Plan of CareGoal: Plan of Care ReviewOutcome: Interventions implemented as appropriateFlowsheets (Taken 08/14/2022 0358)Progress: no changePlan of Care Reviewed With: patient

## 2022-08-18 NOTE — Discharge Summary
[facility]urg Discharge SummaryPatient Data:  Patient Name: Danielle [name] Admit date: 02/11/2022 Age: 66 y.o. Discharge date: 08/14/22 DOB: 17-Feb-1956	 Discharge Attending Physician: Jerolyn Shin MRN: [id number]	 Discharged Condition: Encephalopathy PCP: Pcp, Does Not Have A Disposition: Skilled Nursing Facility for Short Term Rehab. Call made to SNF Provider by Care Team Yes Principal Diagnosis: Encephalopathy; Non occlusive DVTSecondary Diagnoses: Recurrent fevers, R foot cellulitis, hypercalcemia, LE weakness, perforated diverticulum, R lateral and L medial eminence deep tissue injuries, tube feeds/dysphagia; Iron def. 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	Follow up with primary care physician2.   Follow up with Rheumatology for RA tx3.   F/u w/Hematology for duration of AC4.   Follow up with Endocrine about hypercalcemia5.   Rehabilitation in LTC for leg weakness6. Continuation with PT after LTC for leg rehabilitationFollow-up Information:Pcp, Does Not Have AFollow upEndocrine Assoc of [place] [facility] [place] [place] [phone] Hematology Program at [facility] [address] [place] [phone] at [address]  [address] [place] [phone] [address] [place] [phone] Future Appointments Date Time Provider Department Center 11/06/2022  8:00 AM Cornell, Lise Auer, APRN [facility] IRR None 11/06/2022  9:00 AM [facility] [place] [facility] Hospital Course: 66 y.o. 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  In the ED patient was found to be mildly febrile and tachycardic. She was admitted by the hospitalist service for further care. Family also reported continued metabolic encephalopathy and general FTT since the hospitalization in November. Patient had continued fevers, and leukocytosis for which she was treated with Zosyn x5d with resolution to sx. On abdominal imaging multiple peritoneal nodules at first concerning for possible carcinomatosis were seen, but on reevaluation as it was improving though more likely to be continued resolution of pneumoperitoneum and debris from perforation in November. GI and IR were consulted for this in addition to elevated LFTs, but with re-imaging decided against biopsy, recommended continued monitoring of CMP q68mo outpatient. Course further complicated by iron deeficiency and in workup elevated LFC ratio was found, hematology was consulted and believes this to be reactive. Patient also had hypercalcemia, mild but recurrent, likely secondary to prolonged immobilization.She was admitted by the hospitalist service for further care, with work up revealing fever with unclear etiology (resolved). Has undergone extensive work-up of encephalopathy and leg weakness which is unremarkable. Course c/b elevated LFTs which resolved, likely 2/2 DILI from Ceftriaxone or Zosyn. Mental status appears overall stable. Delirium w/u negative including MRI; LP; Paraneoplastic and Autoimmune panels. Patient declined EMG.  Course further c/b bilateral knee pain with hs of RA; evaluated by Rheumatology-advised for prednisone taper along w/continuation of low dose Prednisone 5mg  indefinitely. No plans for MTX at this time given increased risk of infections. Patient has also begun to develop left leg pain that is localized in the knee and ankle that worsens with passive movement of the joints likely due to degenerative osseous changes from immobilization. Has been medically ready for discharge for some time, but discharge planning was complicated by T19, conservatorship hearing and LTC placement.  Hospital Course: #EncephalopathyShe was also encephalopathic during the course of admission with LE weakness. Unclear etiology, possibly prolonged delirium iso  hospitalization and malnutrition from Nov 2022 - MRI brain, MRI total spine unremarkable- LP unremarkable- Paraneoplastic and autoimmune encephalopathy panels negative- Neurology rec'd EMG to complete work-up of LE weakness but pt declined; Neurology does not recommend any further testing and signed off 02/26/22. She improved to her baseline MS A&Ox4.#NutritionPatient initially was admitted and treated for dislodged PEG tube and hx of dysphagia on Jevity TF tid as well as tray- FWF- Calorie counts- Requires 1: 1 feeding assistance. PEG replacement by IR 3/31- TF resumed, which is well tolerated. - Poor po intake over all approx 25% - cleared by SLP for regular solids and thin liquids; meds whole w/thin liquids as tolerated; strict daily oral care and to maintain aspiration precautions. Given poor po intake, patient is continued on Tube feeds along w/food tray. Trialed off tube feeds on 6/23-6/24 which demonstrated encouraging PO intake. She continued to have good PO intake. Her PEG tube was finally removed on end of June, with some drainage from the site of the PEG tube placement needing qweekly dressing change. Though the team had to encourage more calorie intake for the remainder of her hospital stay, patient was amenable to having a moderate po intake. Patient also found to have iron deficiency for which she is taking ferrous gluconate.#Right foot cellulitis#B/l ankle contracture?Given her Right foot cellulites with transient fever episodes work was done. CRP 25.8, procal neg. Xray feet with degenerative changes but no acute findings.  S/P CTX 2/21 and Zosyn 2/21-2/25; S/P Empiric ABX-Oxacillin stopped with negative infectious work-up- Consulted Podiatry. No?indication for podiatric surgical intervention at this time. ?She was plugged to extensive PTx3 per week that improved her ROM in ankles. She was encouraged to move her feet. She continues to have difficulty with her L ankle on discharge and endorses pain when in one position for long periods of time. Patient hopeful to receive intensive therapy in LTAC.For her paresthesias, she was uptitrated on gabapentin from 100mg  TID to 300mg  TID. With moderate control of sxs.?#HypercalcemicPer Endocrinology assessment :Hypercalcemia in the setting of PTH that is in mid reference range ( 28 -30) and elevated x 2 mid molecule PTH. ?PTH is higher than would be expected for a non-PTH mediated mechanism of hypercalcemia. Thus, most likely underlying primary hyperparathyroidism, however, probably unlikely contributing solely to the etiology of persistent and significant hypercalcemia. Taking into consideration clinical scenario, hypercalcemia of immobilization is very likely contributing and probably the leading player in the etiology of hypercalcemia. Risk of hypocalcemia exists but probably low in this patient. No additional recommendations. S/P Zolendronic acid 4 mg X1 (03/11/2022). ?#Abnormal LFTSAttributed to DILI from antibiotics earlier, has sine resolved.#Non-occlusive DVT despite on eliquis for previous DVTPer chart review, this was diagnosed incidentally on 2/1 [place] abdomen imaging ( Incidental note of expansile, hypodense thrombus within the bilateral common femoral veins, with fat stranding about the vessels. Findings are consistent with bilateral non-occlusive deep venous thrombosis.Patient was started on DOAC which was later changed to Lovenox therepeutic dose. 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 Cytology of peritoneal nodules at that time as well was negative for malignancy as well. Resuming home eliquis at discharge.#RA Initially plan had been to continue on prednisone and defer re-starting other agents to outpatient. However, given her long hospital stay while awaiting placement and ongoing joint pain, Rheumatology decided to start Plaquenil while inpatient w/ plan for prednisone taper. Will need Rheum follow up on d/c. Patient being discharged with 2 mg  prednisone.=Use artifical tears PRN for dry eyes. Mouth hygiene. If c/o dry mouth, can start on cevimeline 30 mg TID#OrthostasisPatient has reported long stading dizziness/room spinning when she first awakes that is self resolving or relieved with ani-emetic. #PlacementSister was assumed as the conservator and finalized T19 and medicare proceess. She was finally discharged to LTC in [place]. Given improved capacity, she can consider applying for cancellation of conservatorhsip process if needed. She can also leave LTC if her functional status improves and can be independent with home service.Pertinent Procedures or Surgeries: Surgical and Procedural Summary this Admission   Past Procedures (02/11/2022 to Today)   Date Procedures Providers Location  03/21/2022 IR GASTRIC OR COLONIC TUBE REPLACEMENT W [facility] IR    Pertinent lab findings and test results: Objective: No results for input(s): WBC, HGB, HCT, PLT in the last 168 hours. No results for input(s): NEUTROPHILS, LABLYMP, LABEOS, BANDSP in the last 168 hours. No results for input(s): NA, K, CL, CO2, BUN, CREATININE, GLU, ANIONGAP in the last 168 hours. No results for input(s): CALCIUM, MG, PHOS in the last 168 hours. No results for input(s): ALT, AST, ALKPHOS, BILITOT, BILIDIR in the last 168 hours. No results for input(s): PTT, LABPROT, INR in the last 168 hours. Culture Information:No results for input(s): LABBLOO, LABURIN, LOWERRESPIRA in the last 168 hours.Imaging: 05/01/22 XR R Foot:No acute osseous abnormality. ?Please note evaluation is markedly limited due to osseous demineralization .?05/09/22 XR Bilateral knee:Right knee: There is no acute fracture or dislocation. There is diffuse osseous demineralization. Moderate degenerative changes are noted, greatest at the medial compartment. There is no discernible joint effusion. There is enthesophytosis along the extensor apparatus.Left knee: There is no acute fracture or dislocation. There is diffuse osseous demineralization.Moderate to advanced degenerative changes are noted with bone-on-bone appearance at the lateral compartment. There appears to be some bony remodeling along the lateral tibial plateau which could be a sequel of prior injury or degenerative change. There is enthesophytosis along the extensor apparatus.?There is trace joint effusion. Ill-defined sclerotic foci in the distal femoral shaft are indeterminate in etiology but could represent a low-grade chondroid lesion or osteonecrosis.?05/28/22 XR L ankle:There is no acute fracture or dislocation. There is diffuse osseous demineralization. No significant degenerative changes are noted at the ankle. 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 Trace joint effusion.Diet:  Regular diet with nutritional supplementsMobility: Highest Level of mobility - ACTUAL: Mobility Level 2, Turn self in bed/bed activity/dependent transfer,AM [facility] 6-7Physical Therapy Disposition Recommendation: SNF for long term placement Physical Exam Discharge vitals: Temp:  [97.8 ?F (36.6 ?C)-98 ?F (36.7 ?C)] 98 ?F (36.7 ?C)Pulse: [63-100] 100Resp:  [16] 16BP: (98-101)/(66) 101/66SpO2:  [98 %-100 %] 98 %Device (Oxygen Therapy): room air Pertinent Findings of Physical Exam: L ankle weakness of dorsiflexion, pain on passive movement of L leg and ankle.Cognitive Status at Discharge: Baseline Alert and Oriented x 3Discharge Physical Exam:Physical ExamConstitutional:     General: She is not in acute distress.   Appearance: Normal appearance. She is not ill-appearing. HENT:    Head: Normocephalic.    Nose: Nose normal. No congestion or rhinorrhea.    Mouth/Throat:    Mouth: Mucous membranes are moist.    Pharynx: Oropharynx is clear. Eyes:    General: No scleral icterus.   Extraocular Movements: Extraocular movements intact.    Conjunctiva/sclera: Conjunctivae normal.    Pupils: Pupils are equal, round, and reactive to light. Cardiovascular:  Rate and Rhythm: Normal rate and regular rhythm.    Pulses: Normal pulses.    Heart sounds: No murmur heard.   No friction rub. No gallop. Pulmonary:    Effort: Pulmonary effort is normal. No respiratory distress.    Breath sounds: No wheezing, rhonchi or rales. Abdominal:    General: Abdomen is flat. There is no distension.    Palpations: Abdomen is soft.    Tenderness: There is no abdominal tenderness. There is no guarding or rebound. Musculoskeletal:       General: Tenderness and deformity present. No swelling.    Cervical back: Normal range of motion. No rigidity or tenderness.    Right lower leg: No edema.    Left lower leg: No edema.    Comments: Left ankle is plantar flexed and everted at rest. Passive ROM limited in L ankle and painful. Skin:   General: Skin is warm.    Capillary Refill: Capillary refill takes less than 2 seconds.    Coloration: Skin is not jaundiced. Neurological:    General: No focal deficit present.    Mental Status: She is alert and oriented to person, place, and time.    Motor: Weakness present.    Comments: 1/5 strength in dorsiflexion of L ankle. Psychiatric:       Mood and Affect: Mood normal.       Behavior: Behavior normal. Allergies No Known Allergies PMH PSH No past medical history on file. No past surgical history on file. Social History Family History Social History Tobacco Use ? Smoking status: Not on file ? Smokeless tobacco: Not on file Substance Use Topics ? Alcohol use: Not on file  No family history on file. Discharge Medications: Discharge: Current Discharge Medication List  START taking these medications  Details diclofenac (VOLTAREN) 1 % gel Apply 4 g topically 4 (four) times daily.Qty: 100 g, Refills: 2Start date: 08/14/2022  famotidine (PEPCID) 20 mg tablet Take 1 tablet (20 mg total) by mouth daily.Start date: 08/14/2022  ferrous gluconate (FERGON) 324 mg (38 mg iron) tablet Take 1 tablet (324 mg total) by mouth every other day.Start date: 08/14/2022  fluticasone propionate (FLONASE) 50 mcg/actuation nasal spray Use 1 spray in each nostril daily.Qty: 16 g, Refills: 11Start date: 08/14/2022  gabapentin (NEURONTIN) 300 mg capsule Take 1 capsule (300 mg total) by mouth 3 (three) times daily.Start date: 08/14/2022  hydrOXYchloroQUINE (PLAQUENIL) 200 mg tablet Take 1 tablet (200 mg total) by mouth daily.Qty: 14 tablet, Refills: 0Start date: 08/14/2022  lidocaine 4 % topical patch Place 1 patch onto the skin every 24 hours. Remove & Discard patch within 12 hours or as directed by MDQty: 30 patchStart date: 08/14/2022  melatonin 3 mg tablet Take 1 tablet (3 mg total) by mouth nightly.Refills: 0Start date: 08/14/2022  nystatin (MYCOSTATIN) 100,000 unit/gram powder Apply 1 Application  topically 2 (two) times daily.Qty: 15 gStart date: 08/14/2022  ondansetron (ZOFRAN-ODT) 4 mg disintegrating tablet Place 1 tablet (4 mg total) onto the tongue every 8 (eight) hours as needed for nausea for up to 7 days.Qty: 20 tablet, Refills: 0Start date: 08/14/2022, End date: 08/21/2022  predniSONE (DELTASONE) 1 mg tablet Take 2 tablets (2 mg total) by mouth daily. Take with food.Start date: 08/14/2022  Associated Diagnoses: Rheumatoid arthritis, involving unspecified site, unspecified whether rheumatoid factor present (HC Code) (HC CODE) (HC Code)   CONTINUE these medications which have CHANGED  Details acetaminophen (TYLENOL) 325 mg tablet Take 2 tablets (650 mg total) by mouth every 6 (six) hours as needed.Qty: 30  tablet, Refills: 11Start date: 08/14/2022, End date: 09/13/2022   CONTINUE these medications which have NOT CHANGED  Details apixaban (ELIQUIS) 5 mg tablet 1 tablet (5 mg total) by Per G Tube route every 12 (twelve) hours.  insulin lispro (ADMELOG, HUMALOG) 100 unit/mL vial Inject under the skin 3 (three) times daily before meals. Use per sliding scale.  rosuvastatin (CRESTOR) 10 mg tablet 1 tablet (10 mg total) by Per G Tube route nightly.   STOP taking these medications   acetaminophen (TYLENOL) 160 mg/5 mL elixir    chlorhexidine gluconate (PERIDEX) 0.12 % solution    Elemental iron 60 mg (300 mg Ferrous sulfate) / 5 mL oral liquid    folic acid (FOLVITE) 1 mg tablet    niacin 50 mg Immediate Release tablet    pantoprazole (PROTONIX) 40 mg GrPS oral suspension    psyllium (METAMUCIL) packet    pyridoxine, vitamin B6, (VITAMIN B-6) 100 mg tablet    therapeutic multivitamin liquid    thiamine (VITAMIN B1) 100 mg tablet    Electronically Signed:Mateja Dier, MDReachable by [facility]

## 2022-10-13 ENCOUNTER — Encounter: Admit: 2022-10-13 | Payer: MEDICARE | Attending: Hepatology | Primary: Internal Medicine

## 2022-10-24 ENCOUNTER — Encounter: Admit: 2022-10-24 | Payer: PRIVATE HEALTH INSURANCE | Attending: Hepatology | Primary: Internal Medicine

## 2022-10-24 ENCOUNTER — Encounter: Admit: 2022-10-24 | Payer: MEDICARE | Attending: Adult Health | Primary: Internal Medicine

## 2022-10-24 ENCOUNTER — Encounter: Admit: 2022-10-24 | Payer: PRIVATE HEALTH INSURANCE | Primary: Internal Medicine

## 2022-10-24 ENCOUNTER — Ambulatory Visit: Admit: 2022-10-24 | Payer: PRIVATE HEALTH INSURANCE | Attending: Hepatology | Primary: Internal Medicine

## 2022-10-24 DIAGNOSIS — R7401 Transaminitis: Secondary | ICD-10-CM

## 2022-10-24 DIAGNOSIS — M069 Rheumatoid arthritis, unspecified: Secondary | ICD-10-CM

## 2022-10-24 DIAGNOSIS — K219 Gastro-esophageal reflux disease without esophagitis: Secondary | ICD-10-CM

## 2022-10-24 DIAGNOSIS — G8220 Paraplegia, unspecified: Secondary | ICD-10-CM

## 2022-10-24 DIAGNOSIS — G9340 Encephalopathy, unspecified: Secondary | ICD-10-CM

## 2022-10-24 DIAGNOSIS — D649 Anemia, unspecified: Secondary | ICD-10-CM

## 2022-10-24 DIAGNOSIS — I82419 Acute embolism and thrombosis of unspecified femoral vein: Secondary | ICD-10-CM

## 2022-10-24 DIAGNOSIS — R1310 Dysphagia, unspecified: Secondary | ICD-10-CM

## 2022-10-24 DIAGNOSIS — L03115 Cellulitis of right lower limb: Secondary | ICD-10-CM

## 2022-10-24 MED ORDER — TRAMADOL 50 MG TABLET
50 mg | Freq: Three times a day (TID) | ORAL | Status: AC | PRN
Start: 2022-10-24 — End: ?

## 2022-10-24 MED ORDER — ONDANSETRON 4 MG DISINTEGRATING TABLET
4 mg | Freq: Three times a day (TID) | Status: AC | PRN
Start: 2022-10-24 — End: ?

## 2022-10-24 NOTE — Patient Instructions
No follow-up needed, labs provided from facility with normal LFTs and fibroscan without evidence for fibrosis.

## 2022-10-24 NOTE — Progress Notes
SOCIAL WORK NOTEPatient Name: Orphia MaloneMedical Record Number: [id number] Date of Birth: [birth year]Social Work Follow Up  [employer] Most Recent Value Admission Information  Document Type Progress Note Reason for Current Social Work Involvement Resources Visitor Restriction in Place No Intervention Psycho-social Intervention Psycho-social Intervention --  [support/coping] Source of Information Patient, Medical Team Medical Team Comment liver medical team Record Reviewed Yes Level of Care Ambulatory Identified Clinical/Disposition, Issues/Barriers: Ms. Truby Kilson is a 66 year old patient who presents in liver clinic today a new patient. I joined for part of visit to provide support and resources if needed. (P)  Intervention(s)/Summary 10 minutes spent face to face with Ms. Leanna Battles, a nurse from her facility, and provider Francetta Found, APRN. No social work need identified during time of visit. (P)  Referrals/Resources Provided: None (P)  Outcome Resolved (P)  Handoff Required? No (P)  Next Steps/Plan (including hand-off): No further intervention required. Please consult if new needs arise. (P)  Signature: Jernee Murtaugh Miller-Hood, LCSW (P)  Contact Information: [phone] (P)

## 2022-10-24 NOTE — Progress Notes
[facility]      Hepatology Transitional Care Clinic Progress NoteHospital Course: 02/11/22- 8/24/23HPI:Danielle [name] is  66 year old female with past medical history of RA,recent prolonged hospitalization at another facility for perforated diverticulitis (s/p ex Lap/R hemicolectomy with end ileostomy) end of 2022 who then presented to [facility] for persistent encephalopathy/FTT and likely?need for?LTACH. ?In the ED patient was found to be mildly febrile and tachycardic. She was admitted by the hospitalist service for further care. Family also reported continued metabolic encephalopathy and general FTT since the hospitalization in November. Patient had continued fevers, and leukocytosis for which she was treated with Zosyn x5d with resolution to sx. On?abdominal imaging multiple peritoneal nodules at first concerning for possible carcinomatosis?were seen, but on reevaluation as it was improving though more likely to be continued resolution of pneumoperitoneum and debris from perforation in November.?GI and IR were consulted for this in addition to elevated LFTs, but with re-imaging decided against biopsy, recommended continued monitoring of CMP q45mo outpatient.?Course further complicated by iron deeficiency and in workup elevated LFC ratio was found, hematology was consulted and believes this to be reactive. Patient also had hypercalcemia, mild but recurrent, likely secondary to prolonged immobilization.She was admitted by the hospitalist service for further care, with work up revealing fever with unclear etiology (resolved). Has undergone extensive work-up of encephalopathy and leg weakness?which is?unremarkable. Course c/b elevated LFTs which resolved, likely 2/2 DILI from Ceftriaxone or Zosyn.?Mental status appears overall stable. Delirium w/u negative including MRI; LP; Paraneoplastic and Autoimmune panels. Patient declined EMG.  Course further c/b bilateral knee pain with hs of RA; evaluated by Rheumatology-advised for prednisone taper along w/continuation of low dose Prednisone 5mg  indefinitely. No plans for MTX at this time given increased risk of infections. Patient has also begun to develop left leg pain that is localized in the knee and ankle that worsens with passive movement of the joints likely due to degenerative osseous changes from immobilization. Has been medically ready for discharge for some time, but discharge planning was complicated by T19, conservatorship hearing and LTC placement.  #EncephalopathyShe was also encephalopathic during the course of admission with LE weakness. Unclear etiology, possibly prolonged delirium iso hospitalization and malnutrition from Nov 2022 - MRI brain, MRI total spine unremarkable- LP unremarkable- Paraneoplastic and autoimmune encephalopathy panels negative- Neurology rec'd EMG to complete work-up of LE weakness but pt declined; Neurology does not recommend any further testing and signed off 02/26/22. She improved to her baseline MS A&Ox4.#NutritionPatient initially was admitted and treated for dislodged PEG tube and hx of dysphagia on Jevity TF tid as well as tray- FWF- Calorie counts- Requires 1: 1 feeding assistance. PEG replacement?by IR 3/31- TF?resumed, which is well tolerated. - Poor po intake over all approx 25%?- cleared by SLP for regular solids and thin liquids; meds whole w/thin liquids as tolerated; strict daily oral care and to maintain aspiration precautions. Given poor po intake, patient is continued on Tube feeds along w/food tray. Trialed off tube feeds on 6/23-6/24 which demonstrated encouraging PO intake. She continued to have good PO intake. Her PEG tube was finally removed on end of June, with some drainage from the site of the PEG tube placement needing qweekly dressing change. Though the team had to encourage more calorie intake for the remainder of her hospital stay, patient was amenable to having a moderate po intake. Patient also found to have iron deficiency for which she is taking ferrous gluconate.#Right foot cellulitis#B/l ankle contracture?Given her Right foot cellulites with transient fever  episodes work was done.?CRP 25.8, procal neg. Xray feet with degenerative changes but no acute findings.  S/P CTX 2/21 and Zosyn 2/21-2/25; S/P Empiric ABX-Oxacillin stopped with negative infectious work-up- Consulted Podiatry. No?indication for podiatric surgical intervention at this time. ?She was plugged to extensive PTx3 per week that improved her ROM in ankles. She was encouraged to move her feet. She continues to have difficulty with her L ankle on discharge and endorses pain when in one position for long periods of time. Patient hopeful to receive intensive therapy in LTAC.For her paresthesias, she was uptitrated on gabapentin from 100mg  TID to 300mg  TID. With moderate control of sxs.#HypercalcemicPer Endocrinology assessment :Hypercalcemia in the setting of PTH that is in mid reference range ( 28 -30) and elevated x 2 mid molecule PTH. ?PTH is higher than would be expected for a non-PTH mediated mechanism of hypercalcemia. Thus, most likely underlying primary hyperparathyroidism, however, probably unlikely contributing solely to the etiology of persistent and significant hypercalcemia. Taking into consideration clinical scenario, hypercalcemia of immobilization is very likely contributing and probably the leading player in the etiology of hypercalcemia. Risk of hypocalcemia exists but probably low in this patient. No additional recommendations. S/P Zolendronic acid 4 mg X1 (03/11/2022). #Abnormal LFTSAttributed to DILI from antibiotics earlier, has sine resolved.#Non-occlusive DVT despite on eliquis for previous DVTPer chart review, this was diagnosed incidentally on 2/1 [place] abdomen imaging ( Incidental note of expansile, hypodense thrombus within the bilateral common femoral veins, with fat stranding about the vessels. Findings are consistent with bilateral non-occlusive deep venous thrombosis.Patient was started on DOAC which was later changed to Lovenox therepeutic dose. 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 Cytology of peritoneal nodules at that time as well was negative for malignancy as well. Resuming home eliquis at discharge.#RA Initially plan had been to continue on prednisone and defer re-starting other agents to outpatient. However, given her long hospital stay while awaiting placement and ongoing joint pain, Rheumatology decided to start Plaquenil while inpatient w/ plan for prednisone taper. Will need Rheum follow up on d/c. Patient being discharged with 2 mg prednisone#OrthostasisPatient has reported long stading dizziness/room spinning when she first awakes that is self resolving or relieved with ani-emetic.#PlacementSister was assumed as the conservator and finalized T19 and medicare proceess. She was finally discharged to LTC in [place]. Given improved capacity, she can consider applying for cancellation of conservatorhsip process if needed. She can also leave LTC if her functional status improves and can be independent with home service.Today in Clinic:Danielle [name] presents today with aide from [facility] and [facility], she has been at [facility] since her discharge in August. Her facility has gotten her labs recently. She has been feeling and doing well since discharge. He reports no liver-specific complaints. No nausea, vomiting, abdominal pain, jaundice icterus, pruritus, acholic stools, or dark colored urine. No overt liver decompensation such as ascites, encephalopathy, or variceal hemorrhage.The rest of 14-point review of systems was unremarkable, except for what was mentioned in HPI.PMH:No past medical history on file.Allergies:No Known AllergiesMedications:Current Outpatient Medications: ?  apixaban (ELIQUIS) 5 mg tablet, 1 tablet (5 mg total) by Per G Tube route every 12 (twelve) hours., Disp: , Rfl: ?  diclofenac (VOLTAREN) 1 % gel, Apply 4 g topically 4 (four) times daily., Disp: 100 g, Rfl: 2?  famotidine (PEPCID) 20 mg tablet, Take 1 tablet (20 mg total) by mouth daily., Disp: , Rfl: ?  ferrous gluconate (FERGON) 324 mg (  38 mg iron) tablet, Take 1 tablet (324 mg total) by mouth every other day., Disp: , Rfl: ?  fluticasone propionate (FLONASE) 50 mcg/actuation nasal spray, Use 1 spray in each nostril daily., Disp: 16 g, Rfl: 11?  gabapentin (NEURONTIN) 300 mg capsule, Take 1 capsule (300 mg total) by mouth 3 (three) times daily., Disp: , Rfl: ?  hydrOXYchloroQUINE (PLAQUENIL) 200 mg tablet, Take 1 tablet (200 mg total) by mouth daily., Disp: 14 tablet, Rfl: 0?  insulin lispro (ADMELOG, HUMALOG) 100 unit/mL vial, Inject under the skin 3 (three) times daily before meals. Use per sliding scale., Disp: , Rfl: ?  lidocaine 4 % topical patch, Place 1 patch onto the skin every 24 hours. Remove & Discard patch within 12 hours or as directed by MD, Disp: 30 patch, Rfl: ?  melatonin 3 mg tablet, Take 1 tablet (3 mg total) by mouth nightly., Disp: , Rfl: 0?  nystatin (MYCOSTATIN) 100,000 unit/gram powder, Apply 1 Application  topically 2 (two) times daily., Disp: 15 g, Rfl: ?  predniSONE (DELTASONE) 1 mg tablet, Take 2 tablets (2 mg total) by mouth daily. Take with food., Disp: , Rfl: ?  rosuvastatin (CRESTOR) 10 mg tablet, 1 tablet (10 mg total) by Per G Tube route nightly., Disp: , Rfl: PE:BP 96/67  - Pulse 76  - Ht 5' 10 (1.778 m)  - BMI 23.69 kg/m? Constitutional: comfortable, in no acute distress HEENT: No scleral icterus. Abdominal: Soft. Bowel sounds are normal; no distension and no mass. No tenderness; no rebound and no guarding. No hepatosplenomegaly. + colostomyExtremities: no edema  Neurological: alert and oriented to person, place, and time. No asterixisSkin: No rash noted. No erythema. No stigmata of chronic liver diseases.Labs / Data:10/09/22 [facility] ResultsAlbumin 3.8ALT 7AST 13Alk Phos 53Tbili 0.2Component    Latest Ref Rng 07/19/2022 WBC    4.0 - 11.0 x1000/?L 5.3  RBC    4.00 - 6.00 M/?L 3.92 (L)  Hemoglobin    11.7 - 15.5 g/dL 91.4 (L)  Hematocrit    35.00 - 45.00 % 35.90  MCV    80.0 - 100.0 fL 91.6  MCH    27.0 - 33.0 pg 28.3  MCHC    31.0 - 36.0 g/dL 78.2 (L)  RDW-CV    95.6 - 15.0 % 17.3 (H)  Platelets    150 - 420 x1000/?L 268  MPV    8.0 - 12.0 fL 10.3  Neutrophils    39.0 - 72.0 % 44.7  Lymphocytes    17.0 - 50.0 % 37.6  Monocytes    4.0 - 12.0 % 11.2  Eosinophils    0.0 - 5.0 % 5.6 (H)  Basophils    0.0 - 1.4 % 0.7  Immature Granulocytes    0.0 - 1.0 % 0.2  nRBC    0.0 - 1.0 % 0.0  ANC (Abs Neutrophil Count)    2.00 - 7.60 x 1000/?L 2.38  Absolute Lymphocyte Count    0.60 - 3.70 x 1000/?L 2.01  Monocytes (Absolute)    0.00 - 1.00 x 1000/?L 0.60  Eosinophil Absolute Count    0.00 - 1.00 x 1000/?L 0.30  Basophils Absolute    0.00 - 1.00 x 1000/?L 0.04  Immature Granulocytes (Abs)    0.00 - 0.30 x 1000/?L 0.01  nRBC Absolute    0.00 - 1.00 x 1000/?L 0.00  Sodium    136 - 144 mmol/L 140  Potassium    3.3 - 5.3 mmol/L 3.9  Chloride  98 - 107 mmol/L 108 (H)  CO2    20 - 30 mmol/L 24  Anion Gap    7 - 17  8  Glucose    70 - 100 mg/dL 89  BUN    8 - 23 mg/dL 14  Creatinine    1.61 - 1.30 mg/dL 0.96 (L)  Calcium    8.8 - 10.2 mg/dL 04.5 (H)  BUN/Creatinine Ratio    8.0 - 23.0  41.2 (H)  Total Protein    6.6 - 8.7 g/dL 7.1  Albumin    3.6 - 4.9 g/dL 3.7  Total Bilirubin    <=1.2 mg/dL <4.0  Alkaline Phosphatase    9 - 122 U/L 59  Alanine Aminotransferase (ALT)    10 - 35 U/L 12  Aspartate Aminotransferase (AST)    10 - 35 U/L 12  Globulin    2.3 - 3.5 g/dL 3.4  A/G Ratio    1.0 - 2.2  1.1  AST/ALT Ratio    Reference Range Not Established  1.0  eGFR (Creatinine)    >=60 mL/min/1.41m2 >60   Imaging:Liver Elastography (FibroScan?) Procedure Note?Indication: transaminitisPrevious Biopsy: NoBMI: 23.7NPO X 3 Hours: YesProbe: MMedian Liver Stiffness: 4.5 kPaIQR: 13%Controlled Attenuation Parameter: 100 dB/m??Interpretation: A median liver stiffness measurement of 4.5 kPa corresponds to a fibrosis score of F0-F1. These findings are consistent with a minimal risk of clinically significant hepatic fibrosis.A median controlled attenuation parameter (CAP) measurement of 100 dB/m corresponds to a steatosis score of S0. Please note: liver congestion, liver inflammation and an elevated BMI (> 30 kg/m2) may affect liver elastography results.[place] Abd/Pelvis 4/15/23Impression:Unremarkable liver. Cholelithiasis without acute cholecystitis.Impression:Danielle [name] is a 66 year old female with history oManagement plan:DILI:-most likely in the setting of Ceftriaxone/Zosyn  While she was inpatient, during hospitalization liver tests normalized- trended labs have improved- fibroscan liver elastograpy completed with F0-F1Follow-upNo longer term follow-up in liver clinic, if she has elevation in LFTs again or concerns, please contact the office and we would be happy to follow-up

## 2022-10-24 NOTE — Procedures
Liver Elastography (FibroScan?) Procedure NoteIndication: transaminitisPrevious Biopsy: NoBMI: 23.7NPO X 3 Hours: YesProbe: MMedian Liver Stiffness: 4.5 kPaIQR: 13%Controlled Attenuation Parameter: 100 dB/mInterpretation: A median liver stiffness measurement of 4.5 kPa corresponds to a fibrosis score of F0-F1. These findings are consistent with a minimal risk of clinically significant hepatic fibrosis.A median controlled attenuation parameter (CAP) measurement of 100 dB/m corresponds to a steatosis score of S0. Please note: liver congestion, liver inflammation and an elevated BMI (> 30 kg/m2) may affect liver elastography results.Reference Values:Condition F0 - F1(no or mild liver scarring) F2(moderate liver scarring) F3(severe liver scarring) F4(advanced liver scarring) Alcohol-Related Disease 2.0-8.9 kPa  9.0-11.9 kPa  12.0-18.5 kPa  >18.5 kPa  Cholestatic Disease 2.0-8.4 kPa  8.5-10.4 kPa  10.5-16.5 kPa  >16.5 kPa Hepatitis B / C 2.0-6.9 kPa  7.0-9.4 kPa  9.5-12.5 kPa  >12.5 kPa NASH / Other 2.0-8.4 kPa 8.5-9.4 kPa  9.5-13.5 kPa  >13.5 kPa  Steatosis Grade S0(<11% fat in the liver) S1(11-33% fat in the liver) S2(34-66% fat in the liver) S3(>66% fat in the liver) CAP Score <238 dB/m 238-258 dB/m 259-289 dB/m >289 dB/m

## 2022-11-03 ENCOUNTER — Inpatient Hospital Stay: Admit: 2022-11-03 | Discharge: 2022-11-04 | Payer: MEDICARE | Attending: Emergency Medicine | Primary: Nephrology

## 2022-11-03 ENCOUNTER — Emergency Department: Admit: 2022-11-03 | Payer: MEDICARE | Primary: Internal Medicine

## 2022-11-03 ENCOUNTER — Ambulatory Visit: Admit: 2022-11-03 | Payer: MEDICARE | Primary: Internal Medicine

## 2022-11-03 DIAGNOSIS — U071 COVID-19: Secondary | ICD-10-CM

## 2022-11-03 DIAGNOSIS — R5383 Other fatigue: Secondary | ICD-10-CM

## 2022-11-03 LAB — COMPREHENSIVE METABOLIC PANEL
BKR A/G RATIO: 0.9 — ABNORMAL LOW (ref 1.0–2.2)
BKR ALANINE AMINOTRANSFERASE (ALT): 5 U/L — ABNORMAL LOW (ref 10–35)
BKR ALBUMIN: 4 g/dL — ABNORMAL HIGH (ref 3.6–4.9)
BKR ALKALINE PHOSPHATASE: 62 U/L (ref 9–122)
BKR ANION GAP: 9 (ref 7–17)
BKR ASPARTATE AMINOTRANSFERASE (AST): 15 U/L (ref 10–35)
BKR AST/ALT RATIO: 3
BKR BILIRUBIN TOTAL: 0.3 mg/dL (ref <=1.2)
BKR BLOOD UREA NITROGEN: 7 mg/dL — ABNORMAL LOW (ref 8–23)
BKR BUN / CREAT RATIO: 13.7 (ref 8.0–23.0)
BKR CALCIUM: 10.8 mg/dL — ABNORMAL HIGH (ref 8.8–10.2)
BKR CHLORIDE: 107 mmol/L (ref 98–107)
BKR CO2: 26 mmol/L (ref 20–30)
BKR CREATININE: 0.51 mg/dL (ref 0.40–1.30)
BKR EGFR, CREATININE (CKD-EPI 2021): >60 mL/min/{1.73_m2} (ref >=60)
BKR GLOBULIN: 4.5 g/dL — ABNORMAL HIGH (ref 2.3–3.5)
BKR GLUCOSE: 99 mg/dL (ref 70–100)
BKR POTASSIUM: 4.1 mmol/L (ref 3.3–5.3)
BKR PROTEIN TOTAL: 8.5 g/dL — ABNORMAL HIGH (ref 6.6–8.7)
BKR SODIUM: 142 mmol/L (ref 136–144)

## 2022-11-03 LAB — CBC WITH AUTO DIFFERENTIAL
BKR WAM ABSOLUTE IMMATURE GRANULOCYTES.: 0.01 x 1000/ÂµL (ref 0.00–0.30)
BKR WAM ABSOLUTE LYMPHOCYTE COUNT.: 0.95 x 1000/ÂµL (ref 0.60–3.70)
BKR WAM ABSOLUTE NRBC (2 DEC): 0 x 1000/ÂµL (ref 0.00–1.00)
BKR WAM ANALYZER ANC: 3.54 x 1000/ÂµL (ref 2.00–7.60)
BKR WAM BASOPHIL ABSOLUTE COUNT.: 0.03 x 1000/ÂµL (ref 0.00–1.00)
BKR WAM BASOPHILS: 0.6 % (ref 0.0–1.4)
BKR WAM EOSINOPHIL ABSOLUTE COUNT.: 0 x 1000/ÂµL (ref 0.00–1.00)
BKR WAM EOSINOPHILS: 0 % (ref 0.0–5.0)
BKR WAM HEMATOCRIT (2 DEC): 40.4 % (ref 35.00–45.00)
BKR WAM HEMOGLOBIN: 12.9 g/dL (ref 11.7–15.5)
BKR WAM IMMATURE GRANULOCYTES: 0.2 % (ref 0.0–1.0)
BKR WAM LYMPHOCYTES: 18.3 % (ref 17.0–50.0)
BKR WAM MCH (PG): 28.9 pg (ref 27.0–33.0)
BKR WAM MCHC: 31.9 g/dL (ref 31.0–36.0)
BKR WAM MCV: 90.6 fL (ref 80.0–100.0)
BKR WAM MONOCYTE ABSOLUTE COUNT.: 0.65 x 1000/ÂµL (ref 0.00–1.00)
BKR WAM MONOCYTES: 12.5 % — ABNORMAL HIGH (ref 4.0–12.0)
BKR WAM MPV: 10.2 fL (ref 8.0–12.0)
BKR WAM NEUTROPHILS: 68.4 % (ref 39.0–72.0)
BKR WAM NUCLEATED RED BLOOD CELLS: 0 % (ref 0.0–1.0)
BKR WAM PLATELETS: 287 x1000/ÂµL (ref 150–420)
BKR WAM RDW-CV: 15.1 % — ABNORMAL HIGH (ref 11.0–15.0)
BKR WAM RED BLOOD CELL COUNT.: 4.46 M/ÂµL (ref 4.00–6.00)
BKR WAM WHITE BLOOD CELL COUNT: 5.2 x1000/ÂµL (ref 4.0–11.0)

## 2022-11-03 LAB — HEPATIC FUNCTION PANEL
BKR A/G RATIO: 0.9 — ABNORMAL LOW (ref 1.0–2.2)
BKR ALANINE AMINOTRANSFERASE (ALT): 5 U/L — ABNORMAL LOW (ref 10–35)
BKR ALBUMIN: 4 g/dL (ref 3.6–4.9)
BKR ALKALINE PHOSPHATASE: 62 U/L (ref 9–122)
BKR ASPARTATE AMINOTRANSFERASE (AST): 15 U/L (ref 10–35)
BKR AST/ALT RATIO: 3
BKR BILIRUBIN DIRECT: <0.2 mg/dL (ref <=0.3)
BKR BILIRUBIN TOTAL: 0.3 mg/dL (ref <=1.2)
BKR GLOBULIN: 4.5 g/dL — ABNORMAL HIGH (ref 2.3–3.5)
BKR PROTEIN TOTAL: 8.5 g/dL (ref 6.6–8.7)

## 2022-11-03 LAB — C-REACTIVE PROTEIN     (CRP): BKR C-REACTIVE PROTEIN, HIGH SENSITIVITY: 33.5 mg/L — ABNORMAL HIGH

## 2022-11-03 LAB — D-DIMER, QUANTITATIVE: BKR D-DIMER: 2.27 mg{FEU}/L — ABNORMAL HIGH (ref <=0.68)

## 2022-11-03 LAB — INFLUENZA A+B/RSV BY RT-PCR
BKR INFLUENZA A: NEGATIVE
BKR INFLUENZA B: NEGATIVE
BKR RESPIRATORY SYNCYTIAL VIRUS: NEGATIVE

## 2022-11-03 LAB — PROTIME AND INR
BKR INR: 1.15 — ABNORMAL HIGH (ref 0.88–1.14)
BKR PROTHROMBIN TIME: 12.2 s — ABNORMAL HIGH (ref 9.5–12.1)

## 2022-11-03 LAB — SARS COV-2 (COVID-19) RNA: BKR SARS-COV-2 RNA (COVID-19) (YH): POSITIVE — AB

## 2022-11-03 LAB — LIPASE: BKR LIPASE: 14 U/L (ref 11–55)

## 2022-11-03 LAB — MAGNESIUM: BKR MAGNESIUM: 2 mg/dL (ref 1.7–2.4)

## 2022-11-03 MED ORDER — MOLNUPIRAVIR 200 MG CAPSULE (EUA)
200 mg | ORAL_CAPSULE | Freq: Two times a day (BID) | ORAL | 1 refills | Status: AC
Start: 2022-11-03 — End: 2022-11-04

## 2022-11-03 MED ORDER — IOHEXOL 350 MG IODINE/ML INTRAVENOUS SOLUTION
350 mg iodine/mL | Freq: Once | INTRAVENOUS | Status: CP | PRN
Start: 2022-11-03 — End: ?
  Administered 2022-11-04: 03:00:00 350 mL via INTRAVENOUS

## 2022-11-03 MED ORDER — SODIUM CHLORIDE 0.9 % BOLUS (NEW BAG)
0.9 % | Freq: Once | INTRAVENOUS | Status: CP
Start: 2022-11-03 — End: ?
  Administered 2022-11-04: 04:00:00 0.9 mL/h via INTRAVENOUS

## 2022-11-03 MED ORDER — ACETAMINOPHEN 325 MG TABLET
325 mg | Freq: Once | ORAL | Status: CP
Start: 2022-11-03 — End: ?
  Administered 2022-11-04: 04:00:00 325 mg via ORAL

## 2022-11-03 NOTE — ED Notes
2:59 PM Rectal exam done with this RN as chaperone guaiac +

## 2022-11-03 NOTE — ED Notes
2:27 PM 66 y/o female referred in by SNF +covid test pt reports she is tired denies any other c/o on exam by provider ? Blood on tongue and back of throat pt moved from EMS to 25 IV access obtained labs collected and sent.

## 2022-11-04 DIAGNOSIS — Z933 Colostomy status: Secondary | ICD-10-CM

## 2022-11-04 DIAGNOSIS — Z7901 Long term (current) use of anticoagulants: Secondary | ICD-10-CM

## 2022-11-04 DIAGNOSIS — Z86718 Personal history of other venous thrombosis and embolism: Secondary | ICD-10-CM

## 2022-11-04 MED ORDER — MOLNUPIRAVIR (EUA) CAPSULE (ED TAKE HOME)
Status: CP
Start: 2022-11-04 — End: ?

## 2022-11-04 NOTE — ED Notes
7:26 PM Received report form previous Charity fundraiser. Assumed pt care. 9:16 PM Pt to [place] scan via stretcher. 9:53 PM Pt returned from [place] scan via stretcher. 10:58 PM VSS. Pt noted to be febrile 100.0 orally MD made aware. Fluids started. 11:26 PM Medicate per MAR. Ileostomy emptied. VSS.2:07 AM Pt provided discharge instructions and paperwork by provide. Vital signs stable prior to discharge. IV removed with catheter intact. [facility].

## 2022-11-04 NOTE — Discharge Instructions
You were seen today in the Emergency Department for COVID. Your exam and your lab tests were reassuring. At this time based on your labs, imaging, and exam, we feel reassured you do not have a life-threatening medical emergency and are safe to go home. To Do:We have prescribed you an antiviral for COVID (molnupiravir). Take the full course as precribed.Call and schedule an appointment with your primary care doctor to follow up on your symptoms.Take your regular medications as prescribed.Please return to the ED if you develop new or worsening or progressive symptoms including oxygen saturation less than 92% (checked on multiple fingers), fever higher than 100.4, severe nausea/vomiting, if you are unable to keep yourself hydrated with fluids, have severe abdominal pain, lightheadedness or feel like you are going to pass out, chest pain, difficulty breathing, confusion, visual changes or weakness or numbness in your extremities or any other symptoms that concern you. In addition you should return to the ED if you are unable to adequate outpatient follow-up as directed.

## 2022-11-04 NOTE — ED Notes
7:35 PM Assisting primary nurse. Incontinent care provided.9:21 PMPatient to [place] via stretcher.9:48 PMPatient returned from [place] via stretcher.

## 2022-11-04 NOTE — ED Notes
7:17 PM Care transferred report given to Aniceto Boss RN

## 2022-11-06 ENCOUNTER — Ambulatory Visit: Admit: 2022-11-06 | Payer: MEDICARE | Attending: Family | Primary: Internal Medicine

## 2022-11-06 ENCOUNTER — Ambulatory Visit: Admit: 2022-11-06 | Payer: MEDICARE | Primary: Internal Medicine

## 2022-11-23 IMAGING — DX DG ABDOMEN 1V
1 series · 1 of 1 positions shown · non-contrast
Comparison: CT 01/22/2022

CLINICAL DATA: Gastrostomy tube location/patency

EXAM:
ABDOMEN - 1 VIEW

[abdomen kub]
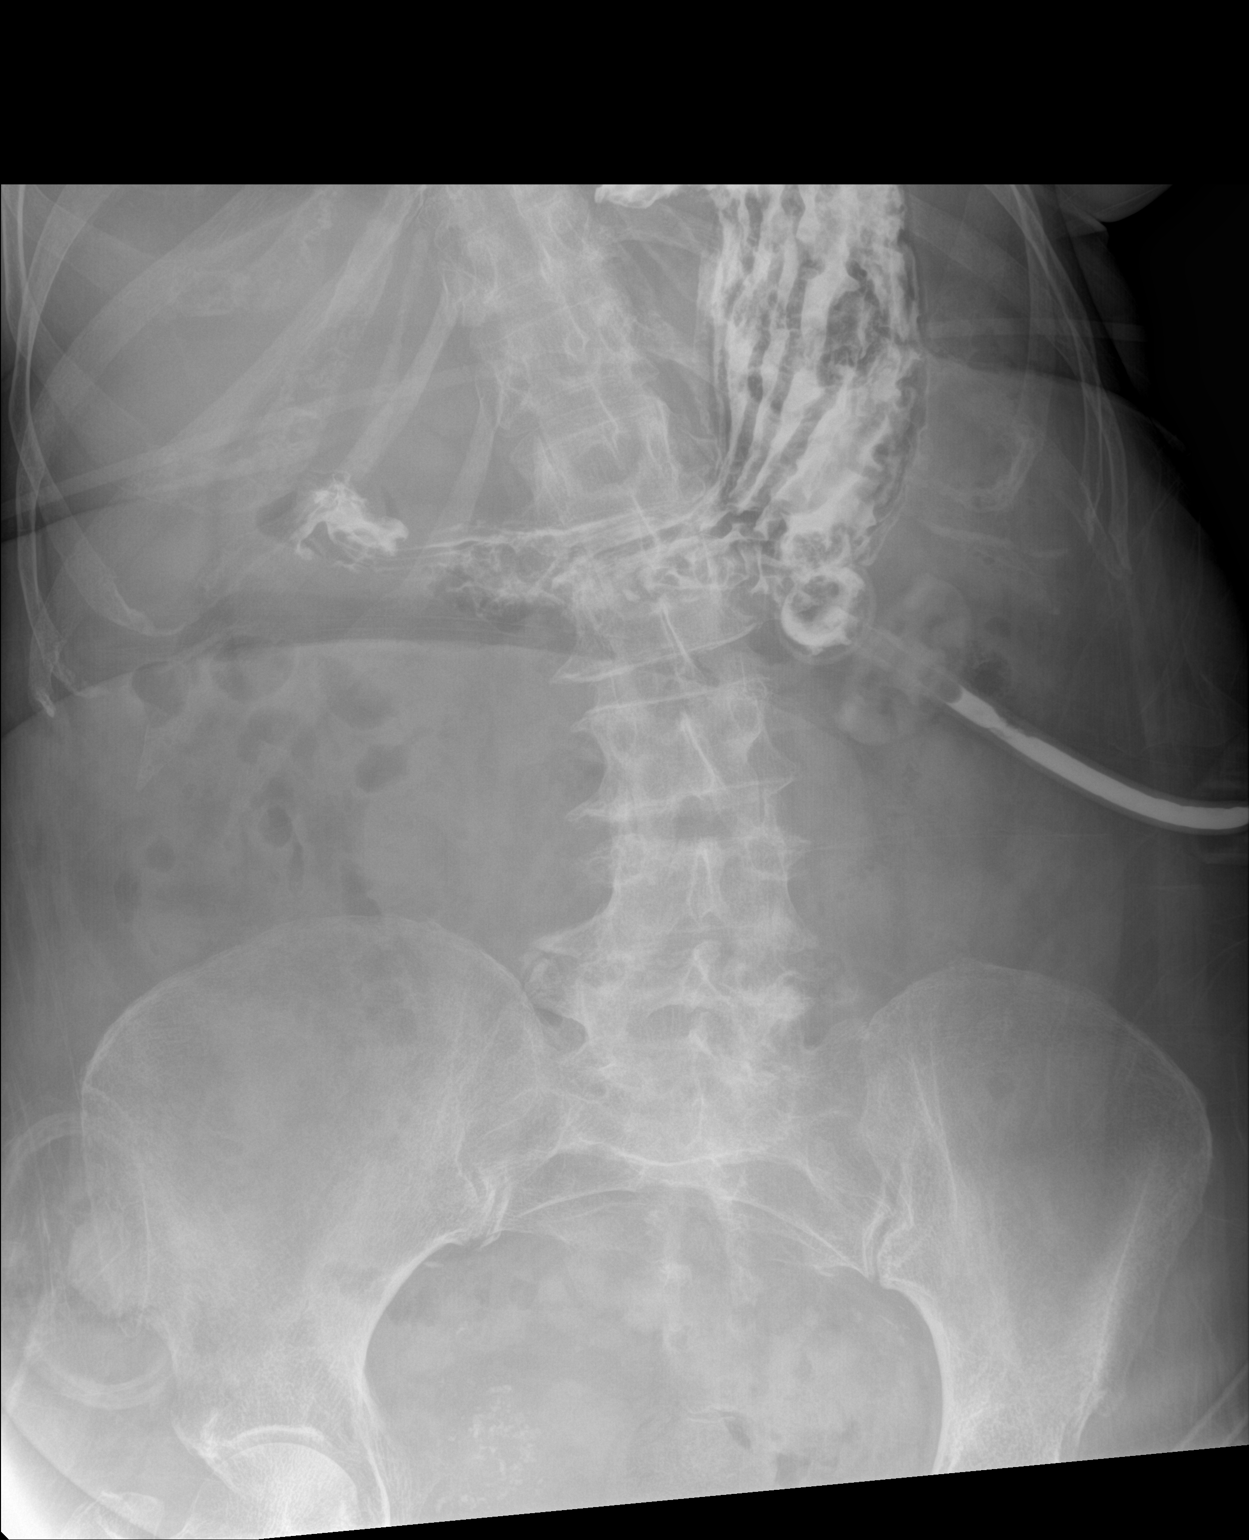

[1 of 1 positions shown; findings below may reference images not displayed]

FINDINGS: There is no evidence of bowel obstruction. There is a percutaneous
gastrostomy tube overlying the stomach. 50 mL of Omnipaque 300 was
injected into the existing tube and flushed with sterile water.
Contrast fills the stomach without evidence of contrast leak.
IMPRESSION: Gastrostomy tube is in the stomach. No evidence of contrast leak on
single frontal view of the abdomen.

## 2022-11-27 IMAGING — XA IR REPLACE G/J TUBE W/ FLUORO
2 series · 5 of 5 positions shown · non-contrast
Comparison: none

INDICATION: Inadvertent removal of gastrostomy catheter. Temporary Foley
catheter placed in the tract bedside.

[Series 1: ir gj tube change · 1 of 1 slices shown (1 of 2)]
[im 1/1]
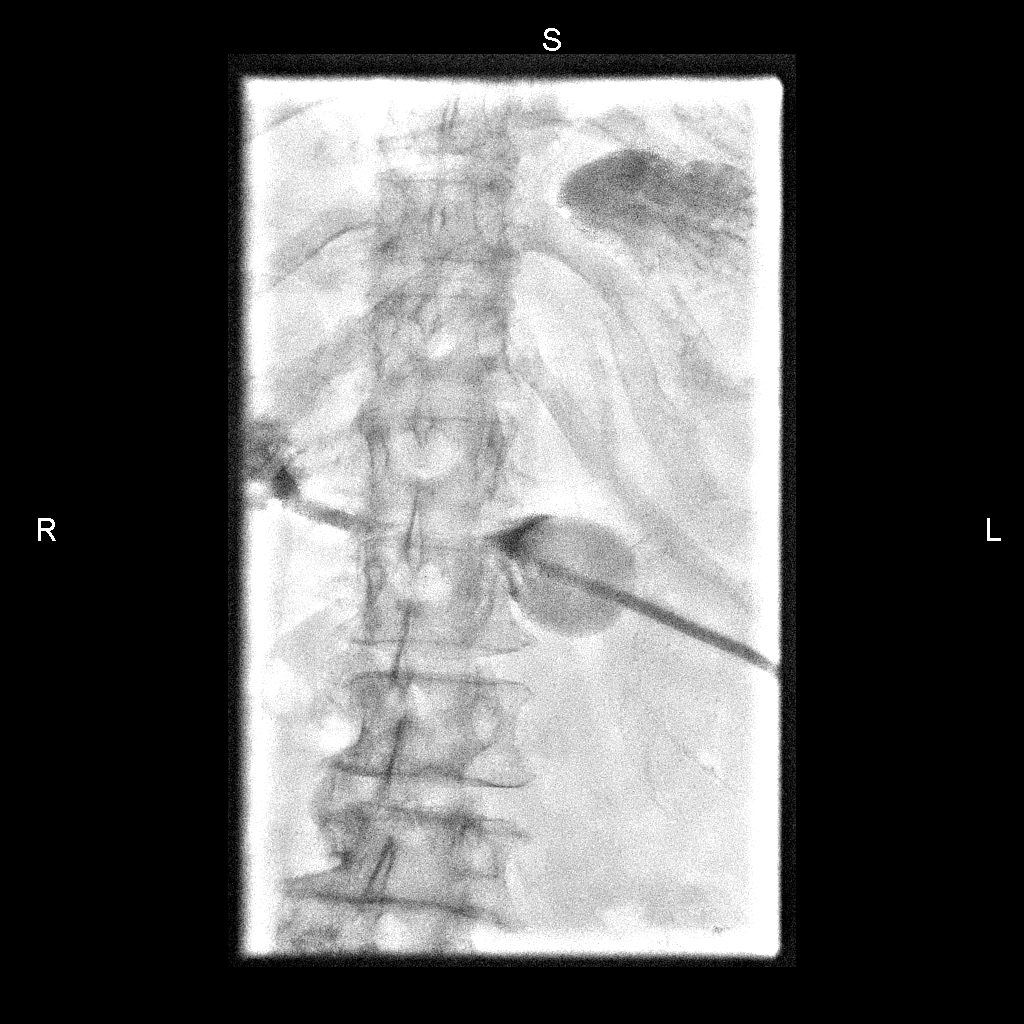

[Series 1: ir gj tube change · 4 of 46 frames shown (2 of 2)]
[frame 7/46]
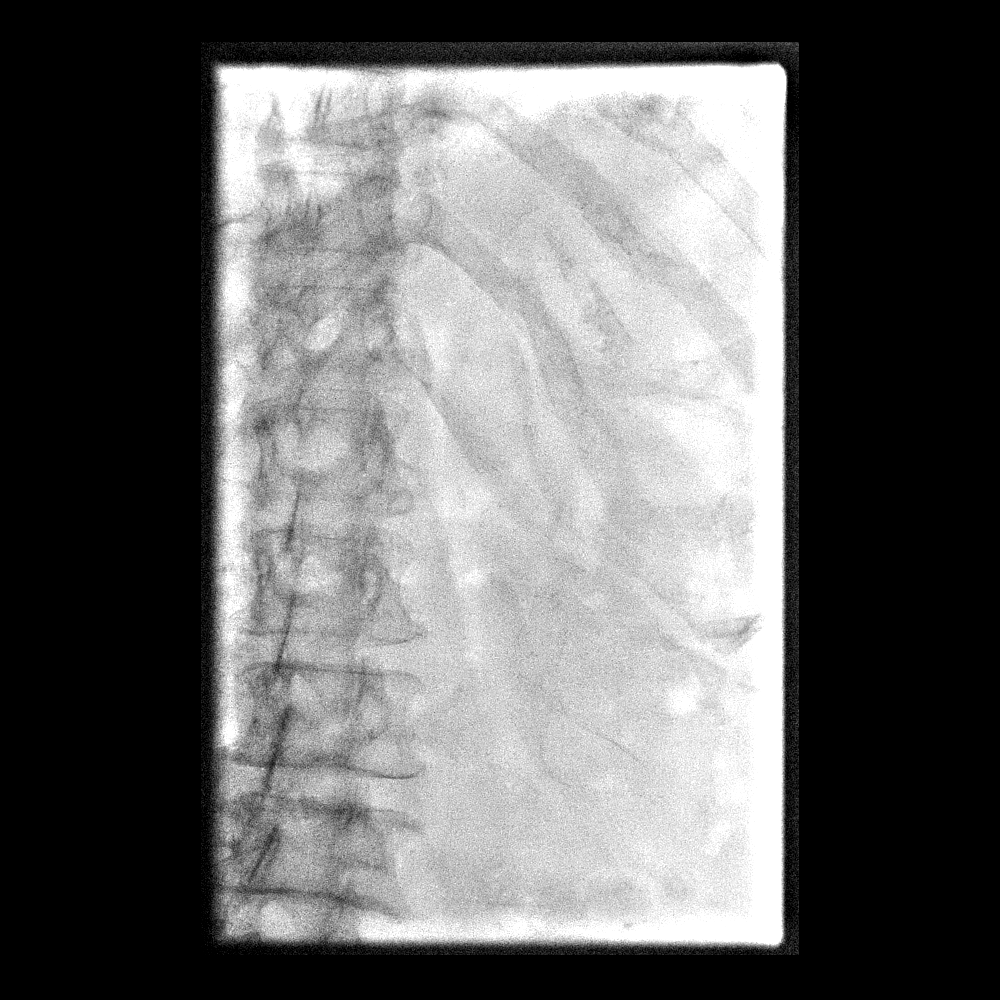
[frame 16/46]
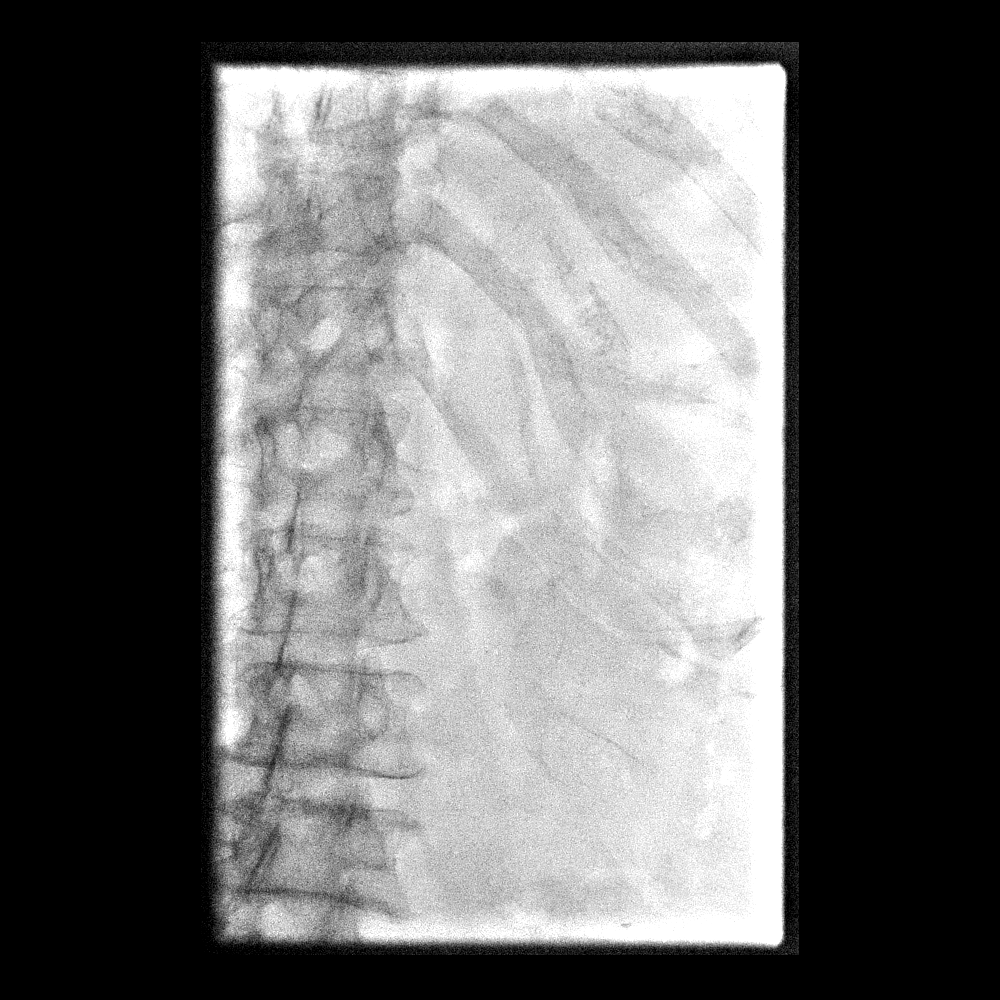
[frame 24/46]
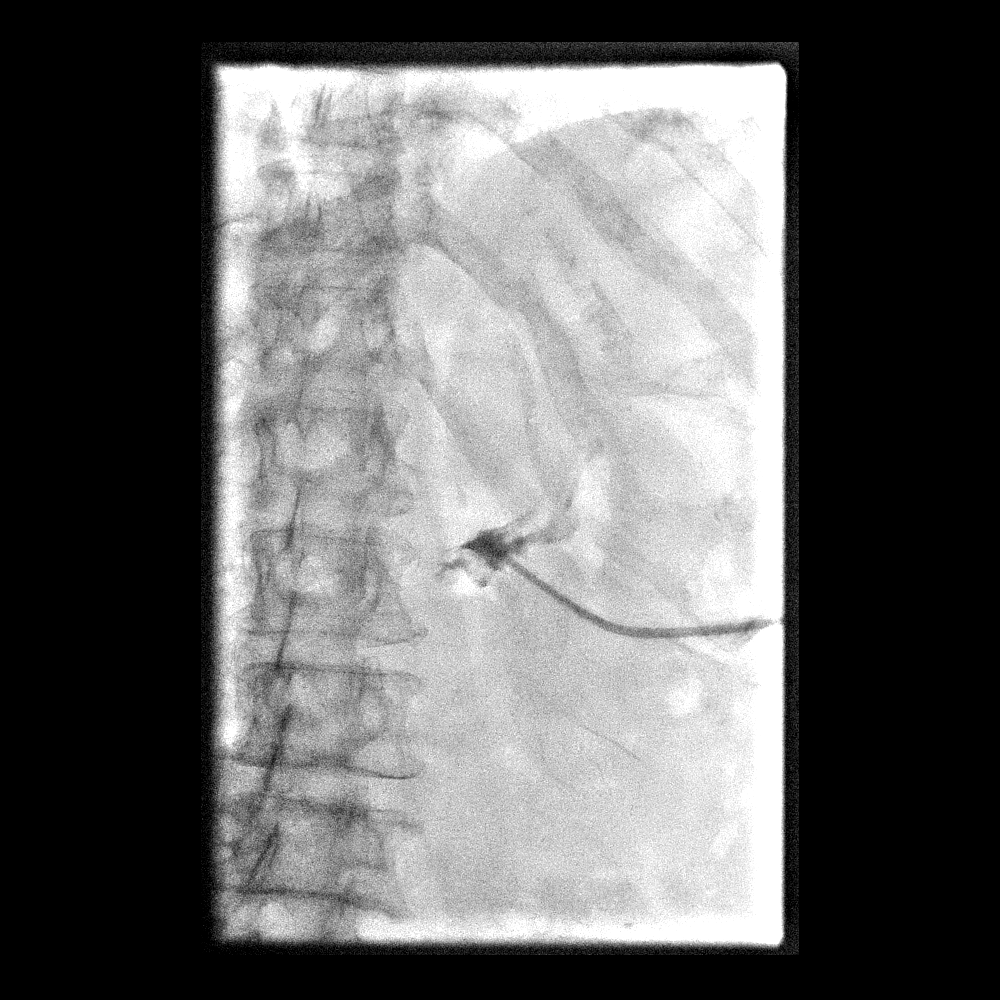
[frame 40/46]
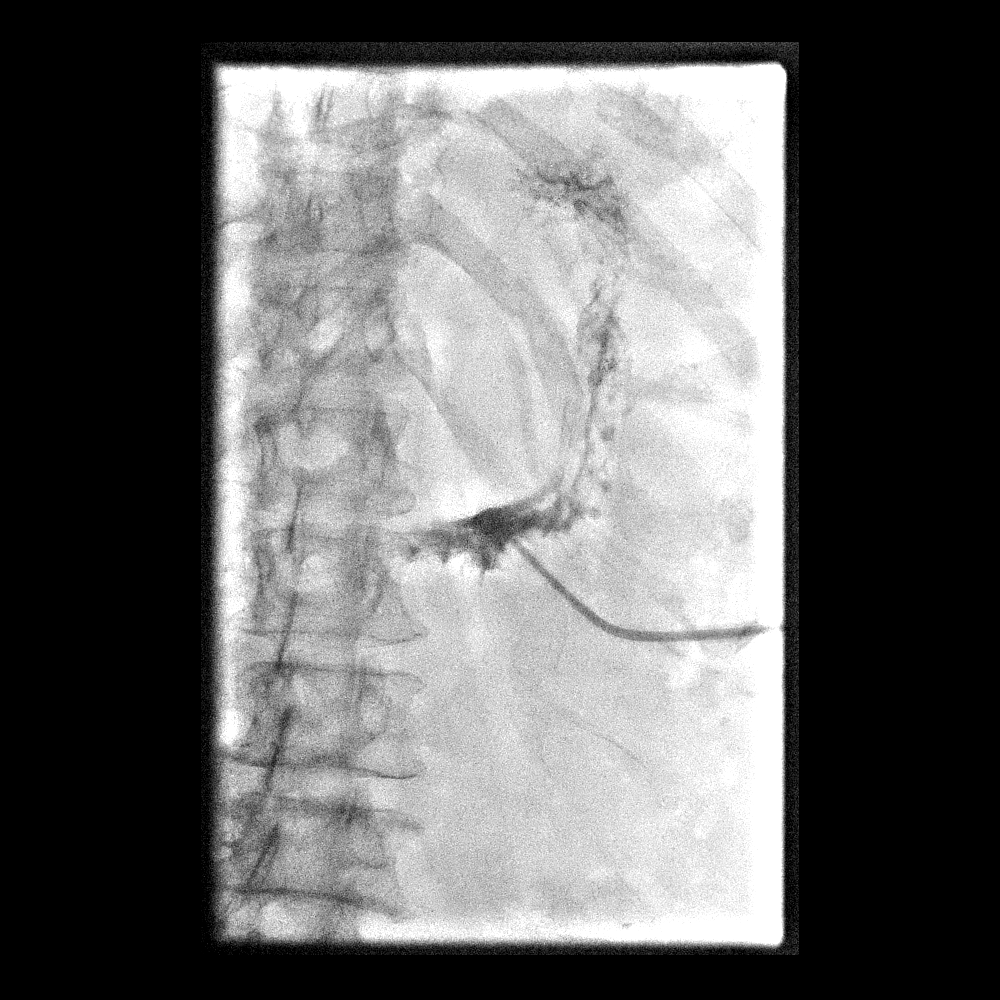

[5 of 5 positions shown; findings below may reference images not displayed]

EXAM:
GASTROSTOMY CHANGE UNDER FLUOROSCOPY

MEDICATIONS:
None indicated;

ANESTHESIA/SEDATION:
None required

CONTRAST:  10 mL Omnipaque 300-administered into the gastric lumen.

PROCEDURE:
Informed written consent was obtained from the patient after a
thorough discussion of the procedural risks, benefits and
alternatives. All questions were addressed. Maximal Sterile Barrier
Technique was utilized including caps, mask, sterile gowns, sterile
gloves, sterile drape, hand hygiene and skin antiseptic. A timeout
was performed prior to the initiation of the procedure.

Small contrast injection through the temporary catheter in the tract
demonstrated opacification of the decompressed stomach. Catheter was
cut and exchanged over an Amplatz wire for a new 24 French balloon
retention catheter. The balloon retention was inflated with 20 mL
saline opacified with 1 mL contrast. Contrast injection confirms
good intraluminal positioning. Catheter was flushed and capped. The
patient tolerated the procedure well.

FLUOROSCOPY:
Radiation Exposure Index (as provided by the fluoroscopic device): 6
mGy Kerma

COMPLICATIONS:
None immediate.
IMPRESSION: 1. Technically successful exchange of gastrostomy catheter for a new
24 French balloon retention device, okay for routine use.

## 2022-12-01 NOTE — ED Provider Notes
Chief Complaint Patient presents with ? Confirmed Coronavirus(Covid-19)   Covid+ this AM - nursing home sent pt in for eval for return to facility, Pt is asymptomatic RESIDENT NOTE AND MDM PRESENTATION:  Pt is a 66 y.o. female PMHx of 65F w/Hx of DVT, paraplegia, RA, PEG tube, perforated diverticulitis and large volume pneumoperitoneum s/p ex lap and R hemicolectomy/ileostomy, presenting for positive COVID test at facility. States she has felt tired the last few days, otherwise denies symptoms including sore throat, SOB, chest pain, nausea, vomiting, abdominal pain. Cannot walk at baseline. Patient denies bloody vomit, bleeding, black stools, blood in stool. On eliquis.Physical Exam:Patient is normotensive (123/80), afebrile, heart rate is 80, O2 sat 96% on RA. Patient does not appear to be in respiratory distress, is alert and oriented with no gross neurological deficits.  Dried black substance on tongue, blood around lips. No blood in nares.  Lungs clear, regular rhythm with no murmurs. Soft abdomen, nontender. DDX AND PLAN:Concern for GI bleed (check CBC, T&S), electrolyte derangement (check lytes), low suspicion for COVID pneumonia given not SOB, will check CXR.COURSE:Hb stable.Electrolytes unremarkable.3:00 PMRectal exam performed with chaperone, no melena or gross blood, stool hemoccult+.On re-examination, tongue moist and green.COVID+CXR unremarkable.Given concern for GI bleed, surgical history, will check [place] A/P.Plan for discharge with molnupiravir if [place] negative for acute pathology. Conservator updated.Signed out to overnight team.This patient was presented to and discussed with the supervising physician and a treatment plan and disposition were collaboratively agreed upon.Philomena Course, MD, PhDPGY-2 Emergency MedicineMDM  Physical ExamED Triage Vitals [11/03/22 1153]BP: 123/80Pulse: 80Pulse from  O2 sat: n/aResp: 16Temp: 99 ?F (37.2 ?C)Temp src: OralSpO2: 96 % BP 115/69  - Pulse (!) 100  - Temp (!) 100 ?F (37.8 ?C) (Oral)  - Resp 18  - SpO2 97% Physical Exam ProceduresAttestation/Critical CarePatient Reevaluation: Attending Supervised: ResidentI saw and examined the patient. I agree with the findings and plan of care as documented in the resident's note except as noted below. Additional acute and/or chronic problems addressed:  66 year old female with history of paraplegia, DVT on Eliquis here with concern for COVID.  Patient reports feeling tired.  She also has bilious stool in her ostomy bag  And dark green ? Bilious  On her tongue.   She provides little history  Other than feeling poorly. exam notable for  Dark green tongue.  No palpable tenderness in abdomen.  Green stool in ostomy. A/P:  COVID -  No significant oxygen requirement   However patient is high risk and would benefit from some antiviral treatment like Paxlovid.  GI concerns -  Will plan for [place] abdomen pelvis.  Differential includes  Gallbladder disease, high output ostomy,  Colitis/ ileitis,  GI bleed.Yoandri Congrove F Harrison__________Signed out to me pending [place]. No acute findings, stable for d/c back to facility.Charlann Boxer, MDClinical Impressions as of 11/04/22 0035 COVID  ED DispositionDischarge Berenice Bouton, MDResident11/13/23 2219 Charlann Boxer, MD11/14/23 0035 Corliss Skains, MD12/11/23 1110

## 2023-01-02 ENCOUNTER — Telehealth: Admit: 2023-01-02 | Payer: PRIVATE HEALTH INSURANCE | Attending: Hepatology | Primary: Internal Medicine

## 2023-01-02 NOTE — Telephone Encounter
Called [facility] facility and requested patient's current blood work for her upcoming appointment with Francetta Found on 01/06/23.

## 2023-01-06 ENCOUNTER — Encounter: Admit: 2023-01-06 | Payer: MEDICARE | Attending: Hepatology | Primary: Internal Medicine

## 2023-01-16 ENCOUNTER — Ambulatory Visit: Admit: 2023-01-16 | Payer: MEDICARE | Attending: Hepatology | Primary: Internal Medicine

## 2023-01-16 ENCOUNTER — Encounter: Admit: 2023-01-16 | Payer: PRIVATE HEALTH INSURANCE | Primary: Internal Medicine

## 2023-01-16 DIAGNOSIS — R7401 Transaminitis: Secondary | ICD-10-CM

## 2023-01-16 NOTE — Progress Notes
SOCIAL WORK NOTEPatient Name: Danielle MaloneMedical Record Number: [id number] Date of Birth: Jan 08, 1957Social Work Follow Up  [employer] Most Recent Value Admission Information  Document Type Progress Note Reason for Current Social Work Involvement Resources Visitor Restriction in Place No Intervention Psycho-social Intervention Psycho-social Intervention --  [support/coping] Source of Information Patient, Medical Team Medical Team Comment liver medical team Record Reviewed Yes Level of Care Ambulatory Identified Clinical/Disposition, Issues/Barriers: Danielle [name] is a 67 year old female patient who presents to liver clinic today for follow up with provider. I joined visit to provide support and resources if needed. (P)  Intervention(s)/Summary 15 minutes spent face to face with Danielle [name], staff from her facility, and provider. No social work needs identified at this time. (P)  Collaboration with Treatment Team/Community Providers/Family: Micahela Fuehrer, APRN (P)  Referrals/Resources Provided: None (P)  Outcome Resolved (P)  Handoff Required? No (P)  Next Steps/Plan (including hand-off): No further intervention required. Please consult if new social work needs arise. (P)  Signature: Hebe Merriwether Miller-Hood, LCSW (P)  Contact Information: [phone] (P)

## 2023-01-16 NOTE — Progress Notes
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 ?In the ED patient was found to be mildly febrile and tachycardic. She was admitted by the hospitalist service for further care. Family also reported continued metabolic encephalopathy and general FTT since the hospitalization in November. Patient had continued fevers, and leukocytosis for which she was treated with Zosyn x5d with resolution to sx. On?abdominal imaging multiple peritoneal nodules at first concerning for possible carcinomatosis?were seen, but on reevaluation as it was improving though more likely to be continued resolution of pneumoperitoneum and debris from perforation in November.?GI and IR were consulted for this in addition to elevated LFTs, but with re-imaging decided against biopsy, recommended continued monitoring of CMP q31mo outpatient.?Course further complicated by iron deeficiency and in workup elevated LFC ratio was found, hematology was consulted and believes this to be reactive. Patient also had hypercalcemia, mild but recurrent, likely secondary to prolonged immobilization.She was admitted by the hospitalist service for further care, with work up revealing fever with unclear etiology (resolved). Has undergone extensive work-up of encephalopathy and leg weakness?which is?unremarkable. Course c/b elevated LFTs which resolved, likely 2/2 DILI from Ceftriaxone or Zosyn.?Mental status appears overall stable. Delirium w/u negative including MRI; LP; Paraneoplastic and Autoimmune panels. Patient declined EMG.  Course further c/b bilateral knee pain with hs of RA; evaluated by Rheumatology-advised for prednisone taper along w/continuation of low dose Prednisone 5mg  indefinitely. No plans for MTX at this time given increased risk of infections. Patient has also begun to develop left leg pain that is localized in the knee and ankle that worsens with passive movement of the joints likely due to degenerative osseous changes from immobilization. Has been medically ready for discharge for some time, but discharge planning was complicated by T19, conservatorship hearing and LTC placement.  #EncephalopathyShe was also encephalopathic during the course of admission with LE weakness. Unclear etiology, possibly prolonged delirium iso hospitalization and malnutrition from Nov 2022 - MRI brain, MRI total spine unremarkable- LP unremarkable- Paraneoplastic and autoimmune encephalopathy panels negative- Neurology rec'd EMG to complete work-up of LE weakness but pt declined; Neurology does not recommend any further testing and signed off 02/26/22. She improved to her baseline MS A&Ox4.#NutritionPatient initially was admitted and treated for dislodged PEG tube and hx of dysphagia on Jevity TF tid as well as tray- FWF- Calorie counts- Requires 1: 1 feeding assistance. PEG replacement?by IR 3/31- TF?resumed, which is well tolerated. - Poor po intake over all approx 25%?- cleared by SLP for regular solids and thin liquids; meds whole w/thin liquids as tolerated; strict daily oral care and to maintain aspiration precautions. Given poor po intake, patient is continued on Tube feeds along w/food tray. Trialed off tube feeds on 6/23-6/24 which demonstrated encouraging PO intake. She continued to have good PO intake. Her PEG tube was finally removed on end of June, with some drainage from the site of the PEG tube placement needing qweekly dressing change. Though the team had to encourage more calorie intake for the remainder of her hospital stay, patient was amenable to having a moderate po intake. Patient also found to have iron deficiency for which she is taking ferrous gluconate.#Right foot cellulitis#B/l ankle contracture?Given her Right foot cellulites with transient fever  episodes work was done.?CRP 25.8, procal neg. Xray feet with degenerative changes but no acute findings.  S/P CTX 2/21 and Zosyn 2/21-2/25; S/P Empiric ABX-Oxacillin stopped with negative infectious work-up- Consulted Podiatry. No?indication for podiatric surgical intervention at this time. ?She was plugged to extensive PTx3 per week that improved her ROM in ankles. She was encouraged to move her feet. She continues to have difficulty with her L ankle on discharge and endorses pain when in one position for long periods of time. Patient hopeful to receive intensive therapy in LTAC.For her paresthesias, she was uptitrated on gabapentin from 100mg  TID to 300mg  TID. With moderate control of sxs.#HypercalcemicPer Endocrinology assessment :Hypercalcemia in the setting of PTH that is in mid reference range ( 28 -30) and elevated x 2 mid molecule PTH. ?PTH is higher than would be expected for a non-PTH mediated mechanism of hypercalcemia. Thus, most likely underlying primary hyperparathyroidism, however, probably unlikely contributing solely to the etiology of persistent and significant hypercalcemia. Taking into consideration clinical scenario, hypercalcemia of immobilization is very likely contributing and probably the leading player in the etiology of hypercalcemia. Risk of hypocalcemia exists but probably low in this patient. No additional recommendations. S/P Zolendronic acid 4 mg X1 (03/11/2022). #Abnormal LFTSAttributed to DILI from antibiotics earlier, has sine resolved.#Non-occlusive DVT despite on eliquis for previous DVTPer chart review, this was diagnosed incidentally on 2/1 [place] abdomen imaging ( Incidental note of expansile, hypodense thrombus within the bilateral common femoral veins, with fat stranding about the vessels. Findings are consistent with bilateral non-occlusive deep venous thrombosis.Patient was started on DOAC which was later changed to Lovenox therepeutic dose. 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 Cytology of peritoneal nodules at that time as well was negative for malignancy as well. Resuming home eliquis at discharge.#RA Initially plan had been to continue on prednisone and defer re-starting other agents to outpatient. However, given her long hospital stay while awaiting placement and ongoing joint pain, Rheumatology decided to start Plaquenil while inpatient w/ plan for prednisone taper. Will need Rheum follow up on d/c. Patient being discharged with 2 mg prednisone#OrthostasisPatient has reported long stading dizziness/room spinning when she first awakes that is self resolving or relieved with anti-emetic.#PlacementSister was assumed as the conservator and finalized T19 and medicare proceess. She was finally discharged to LTC in [place]. Given improved capacity, she can consider applying for cancellation of conservatorhsip process if needed. She can also leave LTC if her functional status improves and can be independent with home service.Today in Clinic:Danielle [name] presents today with aide from [facility] and [facility], she has been at [facility] since her discharge in August. She has been feeling well overall without issues. Her most recent labs show normal liver enzymes. She was hoping to discuss her ostomy and possible reversal.  She reports no liver-specific complaints. No nausea, vomiting, abdominal pain, jaundice icterus, pruritus, acholic stools, or dark colored urine. No overt liver decompensation such as ascites, encephalopathy, or variceal hemorrhage.The rest of 14-point review of systems was unremarkable, except for what was mentioned in HPI.JWJ:XBJY Medical History: Diagnosis Date ? Acute embolism and thrombosis of unspecified femoral vein (HC Code)  ? Anemia  ? Cellulitis and abscess of right lower extremity  ? Dysphagia  ? Encephalopathy  ? GERD (gastroesophageal reflux disease)  ? Hypercalcemia  ? Paraplegia (HC Code)  ? Rheumatoid arthritis (HC Code)  Allergies:No Known AllergiesMedications:Current Outpatient Medications: ?  apixaban (ELIQUIS) 5 mg tablet, 1  tablet (5 mg total) by Per G Tube route every 12 (twelve) hours., Disp: , Rfl: ?  diclofenac (VOLTAREN) 1 % gel, Apply 4 g topically 4 (four) times daily., Disp: 100 g, Rfl: 2?  ferrous gluconate (FERGON) 324 mg (38 mg iron) tablet, Take 1 tablet (324 mg total) by mouth every other day., Disp: , Rfl: ?  fluticasone propionate (FLONASE) 50 mcg/actuation nasal spray, Use 1 spray in each nostril daily., Disp: 16 g, Rfl: 11?  gabapentin (NEURONTIN) 300 mg capsule, Take 1 capsule (300 mg total) by mouth 3 (three) times daily., Disp: , Rfl: ?  hydrOXYchloroQUINE (PLAQUENIL) 200 mg tablet, Take 1 tablet (200 mg total) by mouth daily., Disp: 14 tablet, Rfl: 0?  lidocaine 4 % topical patch, Place 1 patch onto the skin every 24 hours. Remove & Discard patch within 12 hours or as directed by MD, Disp: 30 patch, Rfl: ?  melatonin 3 mg tablet, Take 1 tablet (3 mg total) by mouth nightly., Disp: , Rfl: 0?  nystatin (MYCOSTATIN) 100,000 unit/gram powder, Apply 1 Application  topically 2 (two) times daily., Disp: 15 g, Rfl: ?  ondansetron (ZOFRAN-ODT) 4 mg disintegrating tablet, Place 1 tablet (4 mg total) onto the tongue every 8 (eight) hours as needed for nausea., Disp: , Rfl: ?  predniSONE (DELTASONE) 1 mg tablet, Take 2 tablets (2 mg total) by mouth daily. Take with food., Disp: , Rfl: ?  rosuvastatin (CRESTOR) 10 mg tablet, 1 tablet (10 mg total) by Per G Tube route nightly., Disp: , Rfl: ?  traMADoL (ULTRAM) 50 mg tablet, Take 1 tablet (50 mg total) by mouth every 8 (eight) hours as needed for pain., Disp: , Rfl: ?  famotidine (PEPCID) 20 mg tablet, Take 1 tablet (20 mg total) by mouth daily., Disp: , Rfl: ?  insulin lispro (ADMELOG, HUMALOG) 100 unit/mL vial, Inject under the skin 3 (three) times daily before meals. Use per sliding scale., Disp: , Rfl: PE:BP 102/68  - Pulse (!) 58 Constitutional: comfortable, in no acute distress HEENT: No scleral icterus. Abdominal: Soft. Bowel sounds are normal; no distension and no mass. No tenderness; no rebound and no guarding. No hepatosplenomegaly. + colostomyExtremities: no edema  Neurological: alert and oriented to person, place, and time. No asterixisSkin: No rash noted. No erythema. No stigmata of chronic liver diseases.Labs / Data:10/27/22 [facility] ResultsALP 56ALT 6AST 12Tbili 0.4Alb 3.910/19/23 [facility] ResultsAlbumin 3.8ALT 7AST 13Alk Phos 53Tbili 0.2Component    Latest Ref Rng 07/19/2022 WBC    4.0 - 11.0 x1000/?L 5.3  RBC    4.00 - 6.00 M/?L 3.92 (L)  Hemoglobin    11.7 - 15.5 g/dL 16.1 (L)  Hematocrit    35.00 - 45.00 % 35.90  MCV    80.0 - 100.0 fL 91.6  MCH    27.0 - 33.0 pg 28.3  MCHC    31.0 - 36.0 g/dL 09.6 (L)  RDW-CV    04.5 - 15.0 % 17.3 (H)  Platelets    150 - 420 x1000/?L 268  MPV    8.0 - 12.0 fL 10.3  Neutrophils    39.0 - 72.0 % 44.7  Lymphocytes    17.0 - 50.0 % 37.6  Monocytes    4.0 - 12.0 % 11.2  Eosinophils    0.0 - 5.0 % 5.6 (H)  Basophils    0.0 - 1.4 % 0.7  Immature Granulocytes    0.0 - 1.0 % 0.2  nRBC    0.0 - 1.0 % 0.0  ANC (Abs Neutrophil Count)    2.00 - 7.60 x 1000/?L 2.38  Absolute Lymphocyte Count    0.60 - 3.70 x 1000/?L 2.01  Monocytes (Absolute)    0.00 - 1.00 x 1000/?L 0.60  Eosinophil Absolute Count    0.00 - 1.00 x 1000/?L 0.30  Basophils Absolute    0.00 - 1.00 x 1000/?L 0.04  Immature Granulocytes (Abs)    0.00 - 0.30 x 1000/?L 0.01  nRBC Absolute    0.00 - 1.00 x 1000/?L 0.00  Sodium    136 - 144 mmol/L 140  Potassium    3.3 - 5.3 mmol/L 3.9  Chloride    98 - 107 mmol/L 108 (H)  CO2    20 - 30 mmol/L 24  Anion Gap    7 - 17  8  Glucose    70 - 100 mg/dL 89  BUN    8 - 23 mg/dL 14  Creatinine    1.61 - 1.30 mg/dL 0.96 (L)  Calcium    8.8 - 10.2 mg/dL 04.5 (H)  BUN/Creatinine Ratio    8.0 - 23.0  41.2 (H)  Total Protein    6.6 - 8.7 g/dL 7.1  Albumin    3.6 - 4.9 g/dL 3.7  Total Bilirubin    <=1.2 mg/dL <4.0  Alkaline Phosphatase    9 - 122 U/L 59  Alanine Aminotransferase (ALT)    10 - 35 U/L 12  Aspartate Aminotransferase (AST)    10 - 35 U/L 12  Globulin    2.3 - 3.5 g/dL 3.4  A/G Ratio    1.0 - 2.2  1.1  AST/ALT Ratio    Reference Range Not Established  1.0  eGFR (Creatinine)    >=60 mL/min/1.24m2 >60   Imaging:Liver Elastography (FibroScan?) Procedure Note 10/24/22?Indication: transaminitisPrevious Biopsy: NoBMI: 23.7NPO X 3 Hours: YesProbe: MMedian Liver Stiffness: 4.5 kPaIQR: 13%Controlled Attenuation Parameter: 100 dB/m??Interpretation: A median liver stiffness measurement of 4.5 kPa corresponds to a fibrosis score of F0-F1. These findings are consistent with a minimal risk of clinically significant hepatic fibrosis.A median controlled attenuation parameter (CAP) measurement of 100 dB/m corresponds to a steatosis score of S0. Please note: liver congestion, liver inflammation and an elevated BMI (> 30 kg/m2) may affect liver elastography results.Marlette Abd/Pelvis 4/15/23Impression:Unremarkable liver. 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 We discussed her liver tests which show normal liver enzymes. Monica assisting the patient called Regulatory affairs officer for [employer] who said the appt was scheduled for GI follow-up to discuss ostomy reversal. Clarified she has not had more recent labs than the ones provided from November. Management plan:DILI:-most likely in the setting of Ceftriaxone/Zosyn  While she was inpatient, during hospitalization liver tests normalized- trended labs have improved- fibroscan liver elastograpy completed with F0-F1Follow-up No follow-up indicated, the patient, Danielle [name] and Danielle [name] from [place] were told and written instructions provided regarding return protocol for liver clinic. She would need to see colorectal surgery for management of her ostomy and discussion of reversal.

## 2023-01-30 ENCOUNTER — Encounter: Admit: 2023-01-30 | Payer: PRIVATE HEALTH INSURANCE | Attending: Internal Medicine | Primary: Internal Medicine

## 2023-01-30 DIAGNOSIS — R29898 Other symptoms and signs involving the musculoskeletal system: Secondary | ICD-10-CM

## 2023-03-09 ENCOUNTER — Ambulatory Visit: Admit: 2023-03-09 | Payer: MEDICARE | Attending: Diagnostic Radiology | Primary: Internal Medicine

## 2023-03-09 ENCOUNTER — Inpatient Hospital Stay
Admit: 2023-03-09 | Discharge: 2023-03-12 | Payer: MEDICARE | Attending: Internal Medicine | Admitting: Internal Medicine

## 2023-03-09 ENCOUNTER — Ambulatory Visit: Admit: 2023-03-09 | Payer: MEDICARE | Primary: Internal Medicine

## 2023-03-09 DIAGNOSIS — A4151 Sepsis due to Escherichia coli [E. coli]: Secondary | ICD-10-CM

## 2023-03-09 LAB — CBC WITH AUTO DIFFERENTIAL
BKR WAM ABSOLUTE IMMATURE GRANULOCYTES.: 0.1 x 1000/ÂµL (ref 0.00–0.30)
BKR WAM ABSOLUTE LYMPHOCYTE COUNT.: 0.72 x 1000/ÂµL (ref 0.60–3.70)
BKR WAM ABSOLUTE NRBC (2 DEC): 0 x 1000/ÂµL (ref 0.00–1.00)
BKR WAM ANALYZER ANC: 14.12 x 1000/ÂµL — ABNORMAL HIGH (ref 2.00–7.60)
BKR WAM BASOPHIL ABSOLUTE COUNT.: 0.05 x 1000/ÂµL (ref 0.00–1.00)
BKR WAM BASOPHILS: 0.3 % (ref 0.0–1.4)
BKR WAM EOSINOPHIL ABSOLUTE COUNT.: 0 x 1000/ÂµL (ref 0.00–1.00)
BKR WAM EOSINOPHILS: 0 % (ref 0.0–5.0)
BKR WAM HEMATOCRIT (2 DEC): 33.7 % — ABNORMAL LOW (ref 35.00–45.00)
BKR WAM HEMOGLOBIN: 11.1 g/dL — ABNORMAL LOW (ref 11.7–15.5)
BKR WAM IMMATURE GRANULOCYTES: 0.6 % (ref 0.0–1.0)
BKR WAM LYMPHOCYTES: 4.5 % — ABNORMAL LOW (ref 17.0–50.0)
BKR WAM MCH (PG): 29.7 pg (ref 27.0–33.0)
BKR WAM MCHC: 32.9 g/dL (ref 31.0–36.0)
BKR WAM MCV: 90.1 fL (ref 80.0–100.0)
BKR WAM MONOCYTE ABSOLUTE COUNT.: 1.15 x 1000/ÂµL — ABNORMAL HIGH (ref 0.00–1.00)
BKR WAM MONOCYTES: 7.1 % (ref 4.0–12.0)
BKR WAM MPV: 10.7 fL (ref 8.0–12.0)
BKR WAM NEUTROPHILS: 87.5 % — ABNORMAL HIGH (ref 39.0–72.0)
BKR WAM NUCLEATED RED BLOOD CELLS: 0 % (ref 0.0–1.0)
BKR WAM PLATELETS: 215 x1000/ÂµL (ref 150–420)
BKR WAM RDW-CV: 14.3 % (ref 11.0–15.0)
BKR WAM RED BLOOD CELL COUNT.: 3.74 M/ÂµL — ABNORMAL LOW (ref 4.00–6.00)
BKR WAM WHITE BLOOD CELL COUNT: 16.1 x1000/ÂµL — ABNORMAL HIGH (ref 4.0–11.0)

## 2023-03-09 LAB — URINALYSIS WITH CULTURE REFLEX      (BH LMW YH)
BKR BILIRUBIN, UA: NEGATIVE
BKR GLUCOSE, UA: NEGATIVE
BKR KETONES, UA: NEGATIVE
BKR NITRITE, UA: POSITIVE — AB
BKR PH, UA: 5.5 (ref 5.5–7.5)
BKR SPECIFIC GRAVITY, UA: 1.032 — ABNORMAL HIGH (ref 1.005–1.030)
BKR UROBILINOGEN, UA (MG/DL): <2 mg/dL (ref <=2.0)

## 2023-03-09 LAB — MAGNESIUM: BKR MAGNESIUM: 1.8 mg/dL (ref 1.7–2.4)

## 2023-03-09 LAB — URINE MICROSCOPIC     (BH GH LMW YH)
BKR RBC/HPF INSTRUMENT: 12 /HPF — ABNORMAL HIGH (ref 0–2)
BKR URINE SQUAMOUS EPITHELIAL CELLS, UA (NUMERIC): 5 /HPF (ref 0–5)
BKR WBC/HPF INSTRUMENT: 686 /HPF — ABNORMAL HIGH (ref 0–5)

## 2023-03-09 LAB — COMPREHENSIVE METABOLIC PANEL
BKR A/G RATIO: 0.7 — ABNORMAL LOW (ref 1.0–2.2)
BKR ALANINE AMINOTRANSFERASE (ALT): 8 U/L — ABNORMAL LOW (ref 10–35)
BKR ALBUMIN: 3.4 g/dL — ABNORMAL LOW (ref 3.6–4.9)
BKR ALKALINE PHOSPHATASE: 84 U/L (ref 9–122)
BKR ANION GAP: 13 (ref 7–17)
BKR ASPARTATE AMINOTRANSFERASE (AST): 32 U/L (ref 10–35)
BKR AST/ALT RATIO: 4
BKR BILIRUBIN TOTAL: 0.3 mg/dL (ref <=1.2)
BKR BLOOD UREA NITROGEN: 24 mg/dL — ABNORMAL HIGH (ref 8–23)
BKR BUN / CREAT RATIO: 23.1 — ABNORMAL HIGH (ref 8.0–23.0)
BKR CALCIUM: 10.1 mg/dL (ref 8.8–10.2)
BKR CHLORIDE: 107 mmol/L (ref 98–107)
BKR CO2: 18 mmol/L — ABNORMAL LOW (ref 20–30)
BKR CREATININE: 1.04 mg/dL (ref 0.40–1.30)
BKR EGFR, CREATININE (CKD-EPI 2021): 59 mL/min/{1.73_m2} — ABNORMAL LOW (ref >=60)
BKR GLOBULIN: 5 g/dL — ABNORMAL HIGH (ref 2.3–3.5)
BKR GLUCOSE: 114 mg/dL — ABNORMAL HIGH (ref 70–100)
BKR POTASSIUM: 4.3 mmol/L (ref 3.3–5.3)
BKR PROTEIN TOTAL: 8.4 g/dL (ref 6.6–8.7)
BKR SODIUM: 138 mmol/L (ref 136–144)

## 2023-03-09 LAB — TROPONIN T HIGH SENSITIVITY, 3 HOUR (BH GH LMW YH)
BKR TROPONIN T HS 1 HOUR DELTA FROM 0 HOUR ON 3HR: 2 ng/L
BKR TROPONIN T HS 3 HOUR DELTA FROM 0 HOUR: 1 ng/L
BKR TROPONIN T HS 3 HOUR: 28 ng/L — ABNORMAL HIGH

## 2023-03-09 LAB — PROTIME AND INR
BKR INR: 1.24 — ABNORMAL HIGH (ref 0.88–1.14)
BKR PROTHROMBIN TIME: 13.1 s — ABNORMAL HIGH (ref 9.5–12.1)

## 2023-03-09 LAB — UA REFLEX CULTURE

## 2023-03-09 LAB — TROPONIN T HIGH SENSITIVITY, 0 HOUR BASELINE WITH REFLEX (BH GH LMW YH): BKR TROPONIN T HS 0 HOUR BASELINE: 27 ng/L — ABNORMAL HIGH

## 2023-03-09 LAB — INFLUENZA A+B/RSV BY RT-PCR
BKR INFLUENZA A: NEGATIVE
BKR INFLUENZA B: NEGATIVE
BKR RESPIRATORY SYNCYTIAL VIRUS: NEGATIVE

## 2023-03-09 LAB — TROPONIN T HIGH SENSITIVITY, 1 HOUR WITH REFLEX (BH GH LMW YH)
BKR TROPONIN T HS 1 HOUR DELTA FROM 0 HOUR: 2 ng/L
BKR TROPONIN T HS 1 HOUR: 29 ng/L — ABNORMAL HIGH

## 2023-03-09 LAB — LACTIC ACID, PLASMA (REFLEX 2H REPEAT): BKR LACTATE: 1.5 mmol/L (ref 0.5–2.2)

## 2023-03-09 LAB — MRSA SCREEN BY PCR: BKR MRSA COLONIZATION STATUS PCR: NOT DETECTED

## 2023-03-09 LAB — SARS COV-2 (COVID-19) RNA: BKR SARS-COV-2 RNA (COVID-19) (YH): NEGATIVE

## 2023-03-09 LAB — NT-PROBNPE: BKR B-TYPE NATRIURETIC PEPTIDE, PRO (PROBNP): 618.9 pg/mL — ABNORMAL HIGH (ref <125.0)

## 2023-03-09 MED ORDER — APIXABAN 5 MG TABLET
5 mg | Freq: Two times a day (BID) | ORAL | Status: DC
Start: 2023-03-09 — End: 2023-03-13
  Administered 2023-03-10 – 2023-03-12 (×5): 5 mg via ORAL

## 2023-03-09 MED ORDER — PIPERACILLIN-TAZOBACTAM (ZOSYN) 4.5GM MBP
Freq: Four times a day (QID) | INTRAVENOUS | Status: DC
Start: 2023-03-09 — End: 2023-03-10
  Administered 2023-03-09: 100.000 mL/h via INTRAVENOUS

## 2023-03-09 MED ORDER — SODIUM CHLORIDE 0.9 % BOLUS (NEW BAG)
0.9 % | Freq: Once | INTRAVENOUS | Status: CP
Start: 2023-03-09 — End: ?
  Administered 2023-03-09: 22:00:00 0.9 mL/h via INTRAVENOUS

## 2023-03-09 MED ORDER — SODIUM CHLORIDE 0.9 % BOLUS (NEW BAG)
0.9 % | Freq: Once | INTRAVENOUS | Status: CP
Start: 2023-03-09 — End: ?
  Administered 2023-03-10: 02:00:00 0.9 mL/h via INTRAVENOUS

## 2023-03-09 MED ORDER — FERROUS GLUCONATE 324 MG (38 MG IRON) TABLET
32438 mg (38 mg iron) | ORAL | Status: DC
Start: 2023-03-09 — End: 2023-03-10

## 2023-03-09 MED ORDER — ROSUVASTATIN 10 MG TABLET
10 mg | Freq: Every evening | ORAL | Status: DC
Start: 2023-03-09 — End: 2023-03-13
  Administered 2023-03-11 – 2023-03-12 (×2): 10 mg via ORAL

## 2023-03-09 MED ORDER — PIPERACILLIN-TAZOBACTAM (ZOSYN) 4.5GM MBP - ED FIRST DOSE
Freq: Once | INTRAVENOUS | Status: CP
Start: 2023-03-09 — End: ?
  Administered 2023-03-09: 18:00:00 100.000 mL/h via INTRAVENOUS

## 2023-03-09 MED ORDER — SODIUM CHLORIDE 0.9 % (FLUSH) INJECTION SYRINGE
0.9 % | INTRAVENOUS | Status: DC | PRN
Start: 2023-03-09 — End: 2023-03-13

## 2023-03-09 MED ORDER — VANCOMYCIN MAR LEVEL
Freq: Once | INTRAVENOUS | Status: DC
Start: 2023-03-09 — End: 2023-03-10

## 2023-03-09 MED ORDER — NOREPINEPHRINE BITARTRATE 4 MG/250 ML (16 MCG/ML) IN DEXTROSE 5 % IV
425016 mg/250 mL (16 mcg/mL) | INTRAVENOUS | Status: DC
Start: 2023-03-09 — End: 2023-03-11

## 2023-03-09 MED ORDER — FAMOTIDINE 20 MG TABLET
20 mg | Freq: Every day | ORAL | Status: DC
Start: 2023-03-09 — End: 2023-03-10
  Administered 2023-03-10: 14:00:00 20 mg via ORAL

## 2023-03-09 MED ORDER — SODIUM CHLORIDE 0.9 % (FLUSH) INJECTION SYRINGE
0.9 % | Freq: Three times a day (TID) | INTRAVENOUS | Status: DC
Start: 2023-03-09 — End: 2023-03-13
  Administered 2023-03-10 – 2023-03-12 (×8): 0.9 mL via INTRAVENOUS

## 2023-03-09 MED ORDER — HYDROXYCHLOROQUINE 200 MG TABLET
200 mg | Freq: Every day | ORAL | Status: DC
Start: 2023-03-09 — End: 2023-03-13

## 2023-03-09 MED ORDER — VANCOMYCIN THERAPY PLACEHOLDER
INTRAVENOUS | Status: DC
Start: 2023-03-09 — End: 2023-03-10

## 2023-03-09 MED ORDER — CEFEPIME IV PUSH 2000 MG VIAL & NS (ADULTS)
Freq: Three times a day (TID) | INTRAVENOUS | Status: DC
Start: 2023-03-09 — End: 2023-03-11
  Administered 2023-03-10 – 2023-03-11 (×5): 20.000 mL via INTRAVENOUS

## 2023-03-09 MED ORDER — HYDROCORTISONE SODIUM SUCCINATE 100 MG SOLUTION FOR INJECTION
100 mg | Freq: Four times a day (QID) | INTRAVENOUS | Status: DC
Start: 2023-03-09 — End: 2023-03-10
  Administered 2023-03-09 – 2023-03-10 (×3): 100 mL via INTRAVENOUS

## 2023-03-09 MED ORDER — ROSUVASTATIN 10 MG TABLET
10 mg | Freq: Every evening | GASTROSTOMY | Status: DC
Start: 2023-03-09 — End: 2023-03-10

## 2023-03-09 MED ORDER — SODIUM CHLORIDE 0.9 % BOLUS (NEW BAG)
0.9 % | Freq: Once | INTRAVENOUS | Status: CP
Start: 2023-03-09 — End: ?
  Administered 2023-03-09: 20:00:00 0.9 mL/h via INTRAVENOUS

## 2023-03-09 MED ORDER — CHLORHEXIDINE GLUCONATE 4 % TOPICAL LIQUID
4 % | Freq: Every day | TOPICAL | Status: DC
Start: 2023-03-09 — End: 2023-03-13
  Administered 2023-03-10 – 2023-03-12 (×3): 4 mL via TOPICAL

## 2023-03-09 MED ORDER — ACETAMINOPHEN 650 MG RECTAL SUPPOSITORY
650 mg | Freq: Three times a day (TID) | RECTAL | Status: DC | PRN
Start: 2023-03-09 — End: 2023-03-10
  Administered 2023-03-10: 01:00:00 650 mg via RECTAL

## 2023-03-09 MED ORDER — CEFEPIME IV PUSH 1000 MG VIAL & NS (ADULTS)
Freq: Two times a day (BID) | INTRAVENOUS | Status: DC
Start: 2023-03-09 — End: 2023-03-10

## 2023-03-09 MED ORDER — VANCOMYCIN IVPB (1.25 G IN 250ML NS)
Freq: Once | INTRAVENOUS | Status: CP
Start: 2023-03-09 — End: ?
  Administered 2023-03-09: 19:00:00 250.000 mL/h via INTRAVENOUS

## 2023-03-09 MED ORDER — SODIUM CHLORIDE 0.9 % BOLUS (NEW BAG)
0.9 % | Freq: Once | INTRAVENOUS | Status: CP
Start: 2023-03-09 — End: ?
  Administered 2023-03-10: 04:00:00 0.9 mL/h via INTRAVENOUS

## 2023-03-09 MED ORDER — ETHYL ALCOHOL 62 % TOPICAL SWAB
62 % | Freq: Two times a day (BID) | NASAL | Status: DC
Start: 2023-03-09 — End: 2023-03-13
  Administered 2023-03-10 – 2023-03-12 (×6): 62 % via NASAL

## 2023-03-09 MED ORDER — ACETAMINOPHEN 325 MG TABLET
325 mg | Freq: Once | ORAL | Status: CP
Start: 2023-03-09 — End: ?
  Administered 2023-03-09: 17:00:00 325 mg via ORAL

## 2023-03-09 MED ORDER — ZZ IMS TEMPLATE
Freq: Two times a day (BID) | ORAL | Status: DC
Start: 2023-03-09 — End: 2023-03-13

## 2023-03-09 MED ORDER — SODIUM CHLORIDE 0.9 % BOLUS (NEW BAG)
0.9 % | Freq: Once | INTRAVENOUS | Status: CP
Start: 2023-03-09 — End: ?
  Administered 2023-03-09: 19:00:00 0.9 mL/h via INTRAVENOUS

## 2023-03-09 MED ORDER — APIXABAN 5 MG TABLET
5 mg | Freq: Two times a day (BID) | GASTROSTOMY | Status: DC
Start: 2023-03-09 — End: 2023-03-10

## 2023-03-09 NOTE — Pharmacy Discharge Note
VANCOMYCIN CONSULT Loading Dose: 1.25 g IVCurrent Vancomycin Order: 1 g IV per laboratory/placeholder data.  Day of Therapy: 1Vancomycin Indication: Suspected MRSA infectionRenal Function: AKIScr: Creatinine (mg/dL) Date Value 09/81/1914 1.04 11/03/2022 0.51 08/04/2022 0.39 (L)  CrCl: CrCl cannot be calculated (Unknown ideal weight.).Level: No results found for: Bertram Gala results found for: MRSA COLONIZATION STATUS PCRNo results found for: MRSA SURVEILLANCE PCRTrough Goal: 10-15Vancomycin level: Yes, indicated based on the following: Rapid change in renal functionRepeat level: Random Level on 3/19 at 0600YNHH/LMH/WH Vancomycin Dosing GuidelineBH Vancomycin Dosing GuidelineNeonatal and Pediatric Vancomycin Dosing GuidelineFor questions, please contact the pharmacist: Lattie Corns, PharmD         Phone/Mobile Heartbeat: MHB

## 2023-03-09 NOTE — ED Notes
12:50 PM Assumed care of pt direct to tr5 for eval. 67 yo female from [facility] center presenting with failure to thrive. Per facility pt with decreased PO intake x days, refusing to eat or drink anything. Facility also reporting increased AMS since this am. Per MS pt found to be hypoxic in 80s on RA, placed on 2L NC. PIV placed in field, NS bolus infusing. Upon arrival pt changed into hospital gown and placed on continuous cardiopulmonary monitoring. All lab work drawn and sent. Pt now on RA satting WNL. Swabs sent to lab. Pt refusing care - education and redirection frequently provided. Awaiting results. 1:26 PM Straight cath performed - UA sent to lab. Misty Stanley, RN

## 2023-03-09 NOTE — ED Notes
1:50 PM Arrival to [facility]. Verbal report from [employer]. Provider to patient side for US guided access. 2:36 PMAccess obtained by provider via US guide. Lab collected/sent. Medicated per [facility]. 2:41 PMDr Ringel aware of hypotension 80/50. 7:13 PMPatient remains hypotensive with stable MAP. Patient denies complaints concerns. BP remains low and providers are aware.

## 2023-03-10 ENCOUNTER — Inpatient Hospital Stay: Admit: 2023-03-10 | Payer: MEDICARE

## 2023-03-10 DIAGNOSIS — A4151 Sepsis due to Escherichia coli [E. coli]: Secondary | ICD-10-CM

## 2023-03-10 LAB — CBC WITH AUTO DIFFERENTIAL
BKR WAM ABSOLUTE IMMATURE GRANULOCYTES.: 0.03 x 1000/ÂµL (ref 0.00–0.30)
BKR WAM ABSOLUTE LYMPHOCYTE COUNT.: 0.18 x 1000/ÂµL — ABNORMAL LOW (ref 0.60–3.70)
BKR WAM ABSOLUTE NRBC (2 DEC): 0 x 1000/ÂµL (ref 0.00–1.00)
BKR WAM ABSOLUTE NRBC (2 DEC): 0 x 1000/ÂµL (ref 0.00–1.00)
BKR WAM ANALYZER ANC: 9.09 x 1000/ÂµL — ABNORMAL HIGH (ref 2.00–7.60)
BKR WAM ANALYZER ANC: 9.93 x 1000/ÂµL — ABNORMAL HIGH (ref 2.00–7.60)
BKR WAM BASOPHIL ABSOLUTE COUNT.: 0.03 x 1000/ÂµL (ref 0.00–1.00)
BKR WAM BASOPHILS: 0.3 % (ref 0.0–1.4)
BKR WAM EOSINOPHIL ABSOLUTE COUNT.: 0.01 x 1000/ÂµL (ref 0.00–1.00)
BKR WAM EOSINOPHILS: 0.1 % (ref 0.0–5.0)
BKR WAM HEMATOCRIT (2 DEC): 30.9 % — ABNORMAL LOW (ref 35.00–45.00)
BKR WAM HEMATOCRIT (2 DEC): 31.1 % — ABNORMAL LOW (ref 35.00–45.00)
BKR WAM HEMOGLOBIN: 9.8 g/dL — ABNORMAL LOW (ref 11.7–15.5)
BKR WAM HEMOGLOBIN: 10 g/dL — ABNORMAL LOW (ref 11.7–15.5)
BKR WAM IMMATURE GRANULOCYTES: 0.3 % (ref 0.0–1.0)
BKR WAM LYMPHOCYTES: 1.7 % — ABNORMAL LOW (ref 17.0–50.0)
BKR WAM MCH (PG): 29.1 pg (ref 27.0–33.0)
BKR WAM MCH (PG): 29.3 pg (ref 27.0–33.0)
BKR WAM MCHC: 31.7 g/dL (ref 31.0–36.0)
BKR WAM MCHC: 32.2 g/dL (ref 31.0–36.0)
BKR WAM MCV: 91.7 fL (ref 80.0–100.0)
BKR WAM MCV: 91.2 fL (ref 80.0–100.0)
BKR WAM MONOCYTE ABSOLUTE COUNT.: 0.38 x 1000/ÂµL (ref 0.00–1.00)
BKR WAM MONOCYTES: 3.6 % — ABNORMAL LOW (ref 4.0–12.0)
BKR WAM MPV: 10.7 fL (ref 8.0–12.0)
BKR WAM MPV: 10.9 fL (ref 8.0–12.0)
BKR WAM NEUTROPHILS: 94 % — ABNORMAL HIGH (ref 39.0–72.0)
BKR WAM NUCLEATED RED BLOOD CELLS: 0 % (ref 0.0–1.0)
BKR WAM NUCLEATED RED BLOOD CELLS: 0 % (ref 0.0–1.0)
BKR WAM PLATELETS: 192 x1000/ÂµL (ref 150–420)
BKR WAM PLATELETS: 176 x1000/ÂµL (ref 150–420)
BKR WAM RDW-CV: 14.5 % (ref 11.0–15.0)
BKR WAM RDW-CV: 14.3 % (ref 11.0–15.0)
BKR WAM RED BLOOD CELL COUNT.: 3.37 M/ÂµL — ABNORMAL LOW (ref 4.00–6.00)
BKR WAM RED BLOOD CELL COUNT.: 3.41 M/ÂµL — ABNORMAL LOW (ref 4.00–6.00)
BKR WAM WHITE BLOOD CELL COUNT: 9.7 x1000/ÂµL (ref 4.0–11.0)
BKR WAM WHITE BLOOD CELL COUNT: 10.6 x1000/ÂµL (ref 4.0–11.0)

## 2023-03-10 LAB — MANUAL DIFFERENTIAL
BKR WAM BANDS (DIFF) (1 DEC): 13.9 % — ABNORMAL HIGH (ref 0.0–10.0)
BKR WAM BASOPHIL - ABS (DIFF) 2 DEC: 0 x 1000/ÂµL (ref 0.00–1.00)
BKR WAM BASOPHILS (DIFF): 0 % (ref 0.0–1.4)
BKR WAM EOSINOPHILS (DIFF) 2 DEC: 0 x 1000/ÂµL (ref 0.00–1.00)
BKR WAM EOSINOPHILS (DIFF): 0 % (ref 0.0–5.0)
BKR WAM LYMPHOCYTE - ABS (DIFF) 2 DEC: 0 x 1000/ÂµL — ABNORMAL LOW (ref 0.60–3.70)
BKR WAM LYMPHOCYTES (DIFF): 0 % — ABNORMAL LOW (ref 17.0–50.0)
BKR WAM METAMYELOCYTES (DIFF) 1 DEC: 0.9 % (ref 0.0–1.0)
BKR WAM MONOCYTE - ABS (DIFF) 2 DEC: 0.16 x 1000/ÂµL (ref 0.00–1.00)
BKR WAM MONOCYTES (DIFF): 1.7 % — ABNORMAL LOW (ref 4.0–12.0)
BKR WAM NEUTROPHILS (DIFF): 83.5 % — ABNORMAL HIGH (ref 39.0–72.0)

## 2023-03-10 LAB — COMPREHENSIVE METABOLIC PANEL
BKR A/G RATIO: 0.7 — ABNORMAL LOW (ref 1.0–2.2)
BKR ALANINE AMINOTRANSFERASE (ALT): 32 U/L (ref 10–35)
BKR ALBUMIN: 2.9 g/dL — ABNORMAL LOW (ref 3.6–4.9)
BKR ALKALINE PHOSPHATASE: 60 U/L (ref 9–122)
BKR ANION GAP: 7 (ref 7–17)
BKR ASPARTATE AMINOTRANSFERASE (AST): 75 U/L — ABNORMAL HIGH (ref 10–35)
BKR AST/ALT RATIO: 2.3
BKR BILIRUBIN TOTAL: 0.6 mg/dL (ref <=1.2)
BKR BLOOD UREA NITROGEN: 22 mg/dL (ref 8–23)
BKR BUN / CREAT RATIO: 23.7 — ABNORMAL HIGH (ref 8.0–23.0)
BKR CALCIUM: 9.1 mg/dL (ref 8.8–10.2)
BKR CHLORIDE: 113 mmol/L — ABNORMAL HIGH (ref 98–107)
BKR CO2: 18 mmol/L — ABNORMAL LOW (ref 20–30)
BKR CREATININE: 0.93 mg/dL (ref 0.40–1.30)
BKR EGFR, CREATININE (CKD-EPI 2021): >60 mL/min/{1.73_m2} (ref >=60)
BKR GLOBULIN: 3.9 g/dL — ABNORMAL HIGH (ref 2.3–3.5)
BKR GLUCOSE: 113 mg/dL — ABNORMAL HIGH (ref 70–100)
BKR POTASSIUM: 4 mmol/L (ref 3.3–5.3)
BKR PROTEIN TOTAL: 6.8 g/dL (ref 6.6–8.7)
BKR SODIUM: 138 mmol/L (ref 136–144)

## 2023-03-10 LAB — BASIC METABOLIC PANEL
BKR ANION GAP: 10 (ref 7–17)
BKR ANION GAP: 9 (ref 7–17)
BKR BLOOD UREA NITROGEN: 23 mg/dL (ref 8–23)
BKR BLOOD UREA NITROGEN: 21 mg/dL (ref 8–23)
BKR BUN / CREAT RATIO: 23.7 — ABNORMAL HIGH (ref 8.0–23.0)
BKR BUN / CREAT RATIO: 25.9 — ABNORMAL HIGH (ref 8.0–23.0)
BKR CALCIUM: 8.8 mg/dL (ref 8.8–10.2)
BKR CALCIUM: 9.2 mg/dL (ref 8.8–10.2)
BKR CHLORIDE: 113 mmol/L — ABNORMAL HIGH (ref 98–107)
BKR CHLORIDE: 110 mmol/L — ABNORMAL HIGH (ref 98–107)
BKR CO2: 17 mmol/L — ABNORMAL LOW (ref 20–30)
BKR CO2: 18 mmol/L — ABNORMAL LOW (ref 20–30)
BKR CREATININE: 0.97 mg/dL (ref 0.40–1.30)
BKR CREATININE: 0.81 mg/dL (ref 0.40–1.30)
BKR EGFR, CREATININE (CKD-EPI 2021): >60 mL/min/{1.73_m2} (ref >=60)
BKR EGFR, CREATININE (CKD-EPI 2021): >60 mL/min/{1.73_m2} (ref >=60)
BKR GLUCOSE: 98 mg/dL (ref 70–100)
BKR GLUCOSE: 110 mg/dL — ABNORMAL HIGH (ref 70–100)
BKR POTASSIUM: 4.1 mmol/L (ref 3.3–5.3)
BKR POTASSIUM: 3.6 mmol/L (ref 3.3–5.3)
BKR SODIUM: 140 mmol/L (ref 136–144)
BKR SODIUM: 137 mmol/L (ref 136–144)

## 2023-03-10 LAB — LACTIC ACID, PLASMA
BKR LACTATE: 3.2 mmol/L — ABNORMAL HIGH (ref 0.5–2.2)
BKR LACTATE: 1.5 mmol/L (ref 0.5–2.2)

## 2023-03-10 LAB — STOOL PATHOGENS BY PCR (BH GH LMW YH)
BKR CAMPYLOBACTER PCR: NOT DETECTED
BKR NOROVIRUS PCR: NOT DETECTED
BKR ROTAVIRUS PCR: NOT DETECTED
BKR SALMONELLA PCR: NOT DETECTED
BKR SHIGA TOXIN 1 PCR: NOT DETECTED
BKR SHIGA TOXIN 2 PCR: NOT DETECTED
BKR SHIGELLA PCR: NOT DETECTED
BKR VIBRIO PCR: NOT DETECTED
BKR YERSINIA PCR: NOT DETECTED

## 2023-03-10 LAB — PHOSPHORUS     (BH GH L LMW YH)
BKR PHOSPHORUS: 2.3 mg/dL (ref 2.2–4.5)
BKR PHOSPHORUS: 2.6 mg/dL (ref 2.2–4.5)

## 2023-03-10 LAB — C. DIFFICILE ASSAY: BKR C. DIFFICILE TOXIN B DNA PCR: NEGATIVE

## 2023-03-10 LAB — HEMOGLOBIN A1C
BKR ESTIMATED AVERAGE GLUCOSE: 111 mg/dL
BKR HEMOGLOBIN A1C: 5.5 % (ref 4.0–5.6)

## 2023-03-10 LAB — TSH W/REFLEX TO FT4     (BH GH LMW Q YH): BKR THYROID STIMULATING HORMONE: 0.527 u[IU]/mL

## 2023-03-10 LAB — MAGNESIUM
BKR MAGNESIUM: 1.7 mg/dL (ref 1.7–2.4)
BKR MAGNESIUM: 1.8 mg/dL (ref 1.7–2.4)

## 2023-03-10 MED ORDER — ACETAMINOPHEN 325 MG TABLET
325 mg | Freq: Three times a day (TID) | ORAL | Status: DC | PRN
Start: 2023-03-10 — End: 2023-03-13
  Administered 2023-03-10 – 2023-03-12 (×3): 325 mg via ORAL

## 2023-03-10 MED ORDER — LACTATED RINGERS IV BOLUS (NEW BAG)
Freq: Once | INTRAVENOUS | Status: CP
Start: 2023-03-10 — End: ?
  Administered 2023-03-10: 11:00:00 1000.000 mL/h via INTRAVENOUS

## 2023-03-10 MED ORDER — GUAR GUM ORAL PACKET
Freq: Three times a day (TID) | ORAL | Status: DC
Start: 2023-03-10 — End: 2023-03-13
  Administered 2023-03-10 – 2023-03-12 (×7): via ORAL

## 2023-03-10 MED ORDER — HYDROCORTISONE SODIUM SUCCINATE 100 MG SOLUTION FOR INJECTION
100 mg | Freq: Two times a day (BID) | INTRAVENOUS | Status: CP
Start: 2023-03-10 — End: ?
  Administered 2023-03-11: 01:00:00 100 mL via INTRAVENOUS

## 2023-03-10 MED ORDER — PANTOPRAZOLE 40 MG TABLET,DELAYED RELEASE
40 mg | Freq: Every day | ORAL | Status: DC
Start: 2023-03-10 — End: 2023-03-13
  Administered 2023-03-11 – 2023-03-12 (×2): 40 mg via ORAL

## 2023-03-10 MED ORDER — LACTATED RINGERS IV BOLUS (NEW BAG)
Freq: Once | INTRAVENOUS | Status: CP
Start: 2023-03-10 — End: ?
  Administered 2023-03-10: 05:00:00 1000.000 mL/h via INTRAVENOUS

## 2023-03-10 MED ORDER — MAGNESIUM SULFATE 2 GRAM/50 ML (4 %) IN WATER INTRAVENOUS PIGGYBACK
2504 gram/50 mL (4 %) | Freq: Once | INTRAVENOUS | Status: CP
Start: 2023-03-10 — End: ?
  Administered 2023-03-10: 14:00:00 2 mL/h via INTRAVENOUS

## 2023-03-10 MED ORDER — PREDNISONE 1 MG TABLET
1 mg | Freq: Every day | ORAL | Status: DC
Start: 2023-03-10 — End: 2023-03-13

## 2023-03-10 NOTE — Utilization Review (ED)
Sepsis, IV antibiotics, IV fluid, febrile, hypotensive SBP 80's-90's, WBC 16, Dr. Wesley Blas Status: UR/MD agree on IP status

## 2023-03-10 NOTE — Plan of Care
Transfer Note Nursing Danielle [name] is a 67 y.o. female transferred from [facility] with a chief complaint of hypotension and AMS.Assisted in to bed.  Vitals:  03/10/23 1400 03/10/23 1500 03/10/23 1600 03/10/23 1700 BP: 109/81 91/60 95/73  91/60 Pulse: 70 69 75 60 Resp: (!) 23 18  18  Temp:   98 F (36.7 C)  TempSrc:   Axillary  SpO2: 97% 98% 99% 97% Weight:     Height:      I have reviewed the patient's current medication orders..I have reviewed patient valuables Plan of Care Overview/ Patient Status    [name]/Ox4, NS on tele, on RA, VSS. Skin intact. IV C/D/I. Purewick in draining clear yellow urine. Skin intact pills whole.

## 2023-03-10 NOTE — Progress Notes
Attending attestationI have seen/evaluated the patient and agree with the history, physical exam and plan as documented by the ICU Staff (Resident/ PA-C) with the following modifications and/or additions:SUBJECTIVE: For full details please see note from overnight physician.EXAM: General: In no acute distress, AAO x 2, (self, place and partially in time)HEENT: Normocephalic, PERRL, oral mucosa moistNeck: supple, no JVD, no bruits.Chest: No wheezing, no crackles Heart: RRR, S1/S2, no S3/S4,Abdominal: Soft non tender and non distended. RLQ IleostomyExtremities: No edema, Warm to touch Skin: No rashNeurological: Moving 4 extremities spontaneously and grossly non focal. Labs and images reviewedStudies: Labs:I have reviewed the patient's labs within the last 24 hoursCBCRecent Labs   03/18/241248 03/18/242309 03/19/240555 WBC 16.1* 9.7 10.6 HGB 11.1* 9.8* 10.0* PLT 215 192 176  CoagRecent Labs   03/18/241236 LABPROT 13.1* INR 1.24*  BMPRecent Labs   03/18/241236 03/18/242309 03/19/240555 NA 138 140 138 K 4.3 4.1 4.0 CL 107 113* 113* CO2 18* 17* 18* ANIONGAP 13 10 7  CALCIUM 10.1 8.8 9.1 MG 1.8 1.7 1.8 PHOS  --  2.3 2.6 BUN 24* 23 22 CREATININE 1.04 0.97 0.93 Estimated Creatinine Clearance: 58 mL/min (by C-G formula based on SCr of 0.93 mg/dL). CardiacTroponin: Recent Labs   03/18/241248 03/18/241409 03/18/241628 TROPTHS 27* 29* 28*  LFTRecent Labs   03/18/241236 03/18/242309 03/19/240555 AST 32  --  75* ALT 8*  --  32 ALKPHOS 84  --  60 ALBUMIN 3.4*  --  2.9* PROT 8.4  --  6.8 BILITOT 0.3  --  0.6 LACTATE 1.5 3.2* 1.5  EndoRecent Labs   03/18/242309 TSH 0.527  72h GlucoseRecent Labs   03/18/241236 03/18/242243 03/18/242309 03/18/242337 03/19/240516 03/19/240555 GLU 114* 101* 98 111* 102* 113* 72h UARecent Labs Lab 03/18/241322 SPECGRAV 1.032* PHUR 5.5 LEUKOCYTESUR 4+* NITRITE Positive* PROTEINUA 2+* GLUCOSEU Negative KETONESU Negative BILIRUBINUR Negative UROBILINOGEN <2.0 MICROBIOLOGY:3/19 Blood cultures: GNR3/19 C diff: NegativeDiagnostics:CXR portableResult Date: 3/18/2024No active cardiopulmonary abnormalities.  No change. [facility]: Routine. Reported and signed by: Amie Critchley, MD  Assessment and plan:67 year old female with history of rheumatoid arthritis, DVT on anticoagulation, perforated diverticulitis status post exploratory laparotomy and right hemicolectomy with ileostomy in 2022 who presented to the hospital with changes in mental status and found to be hypotensive.Initially in the emergency room she was noted to be hypotensive with response to fluid resuscitation, subsequently patient became hypotensive again ICU was called given signs of hypoperfusion as needed with rising lactic acid, hypotension.  For that reason pressors were started, patient was aggressively fluid resuscitated, started antibiotic therapy.  She was also placed on stress dose steroids given her chronic use of prednisone 2 mg and Plaquenil for her RA.Overnight the patient came off pressors, improvement in her lactic acidosis, mental statusProblem listSeptic Shock, resolvedUTIGNR bacteremiaMetabolic acidosisEncephalopathy, improvedAKILactic acidosis, improvesPlanNeuroTylenol PRN2.  Cardiovascular:Norepinephrine for a MAP > 65, currently offCrestorEchocardiogram3.  Pulmonary: No active issues at this time4. Infectious disease: Follow up culturesVanco D2 and D/CCefepime D25. Renal:Monitor I/O's urine outputReplete electrolytes as neededBMP6. ZO:[id number] dietGI consultConsider questran for high output ileostomy7. Heme: Apixaban 5 BID8. Endo:Monitor blood sugarsHydrocortisone 50 q6h and taper offPrednisone 2 mg starting in amPlaquenil 200 mg dailyLines:PIV'sProphylaxis:AC on elequisH2 BlockerManuel Verner Chol, MDPulmonary and Critical Care Medicine3/19/20248:39 AMParts of this note were dictated using M*Modal transcription software with attempt to correct any dictation errors.  Please feel free to contact for any clarifications.

## 2023-03-10 NOTE — Transfer Summaries
[facility]	Rapid Response Transfer NoteName: Danielle [name] Age 67 y.o.DOB: 07/21/1957Sex: female	MRN: [name] Adm: 03/09/2023 12:01 PMBed: 9747Reason for Transfer: Septic Shock requiring pressorsDate: 3/19/2024Interim History: This is Danielle [name], who is a 67 y.o. 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  Patient also noted high output from ileostomy with green-colored watery and non-bloody diarrhea for 1 month. She denied any nausea, vomiting, abdominal pain, fever, chills or urinary symptoms. On arrival to ED, initial vitals were Temp 55F, HR 92, BP 84/56 mmHg and SpO2 96% on RA. Admission labs were notable for cr 1.04 (baseline 0.4), LFT WNLs, proBNP 618, troponin flat x3, lactate 1.5, WBC 16.1, Hb 11.1 and plt 215. MRSA PCR, COVID, Influenza and RSV were negative. ECG with NSR, no acute ST or T wave changes. UA showed 2+ protein, 2+ blood, 4+ WBC, positive nitrite and many bacteria. Urine culture NGTD and blood culture growing GNR. CXR was negative for acute pathology.During ED course, patient received Solu-Cortef 50 mg x1, Vanc/Zosyn and total 2L IV bolus. Given improvement in BP to 102/58 mmHg, patient was initially admitted to medical floor. Patient was started on Vanc, Cefepime and Solucortef 50 mg Q6hrs IV. During the floor course, patient became progressively more hypotensive to 68/40 mmHg for which RRT was called. Patient was started on peripheral Levophed along with 1L NS bolus and transferred to MICU for further management of septic shock. Past medical history Past surgical history Past Medical History: Diagnosis Date Acute embolism and thrombosis of unspecified femoral vein (HC Code)   Anemia   Cellulitis and abscess of right lower extremity   Dysphagia   Encephalopathy   GERD (gastroesophageal reflux disease)   Hypercalcemia   Paraplegia (HC Code)   Rheumatoid arthritis (HC Code)   Past Surgical History: Procedure Laterality Date  BAND HEMORRHOIDECTOMY    CYSTECTOMY  1972  LAPAROSCOPIC SALPINGOOPHERECTOMY  1986  LAPAROTOMY  2022  PARTIAL COLECTOMY  2022  PEG TUBE PLACEMENT  01/2022  UPPER GASTROINTESTINAL ENDOSCOPY    Social history Family history Social History Tobacco Use  Smoking status: Not on file  Smokeless tobacco: Not on file Substance Use Topics  Alcohol use: Not on file Social History Substance and Sexual Activity Alcohol Use None  Social History Substance and Sexual Activity Drug Use Not on file  Occupational History  Not on file  Marital status: Single No family history on file. Outpatient medications Outpatient Medications Marked as Taking for the 03/09/23 encounter [facility] Encounter) Medication Sig Dispense Refill  apixaban (ELIQUIS) 5 mg tablet 1 tablet (5 mg total) by Per G Tube route every 12 (twelve) hours.    diclofenac (VOLTAREN) 1 % gel Apply 4 g topically 4 (four) times daily. 100 g 2  famotidine (PEPCID) 20 mg tablet Take 1 tablet (20 mg total) by mouth daily.    ferrous gluconate (FERGON) 324 mg (38 mg iron) tablet Take 1 tablet (324 mg total) by mouth every other day.    gabapentin (NEURONTIN) 400 mg capsule Take 1 capsule (400 mg total) by mouth 2 (two) times daily with breakfast and dinner.    gabapentin (NEURONTIN) 600 mg tablet Take 1 tablet (600 mg total) by mouth nightly.    hydrOXYchloroQUINE (PLAQUENIL) 200 mg tablet  Take 1 tablet (200 mg total) by mouth daily. 14 tablet 0  insulin lispro (ADMELOG, HUMALOG) 100 unit/mL vial Inject under the skin 3 (three) times daily before meals. Use per sliding scale.    lidocaine 4 % topical patch Place 1 patch onto the skin every 24 hours. Remove & Discard patch within 12 hours or as directed by MD 30 patch   melatonin 3 mg tablet Take 1 tablet (3 mg total) by mouth nightly.  0  nystatin (MYCOSTATIN) 100,000 unit/gram powder Apply 1 Application  topically 2 (two) times daily. 15 g   ondansetron (ZOFRAN-ODT) 4 mg disintegrating tablet Place 1 tablet (4 mg total) onto the tongue every 8 (eight) hours as needed for nausea.    rosuvastatin (CRESTOR) 10 mg tablet 1 tablet (10 mg total) by Per G Tube route nightly.    traMADoL (ULTRAM) 50 mg tablet Take 1 tablet (50 mg total) by mouth every 6 (six) hours as needed for pain.    Allergies No Known Allergies Data: Vitals:I have reviewed the patient's current vital signs as documented in the patient's EMR.   and Last 24 hours: Temp:  [97.8 ?F (36.6 ?C)-102.3 ?F (39.1 ?C)] 97.8 ?F (36.6 ?C)Pulse:  [58-95] 58Resp:  [13-21] 14BP: (68-105)/(40-70) 97/62SpO2:  [85 %-100 %] 97 %On RAABG: No results for input(s): PHART, PCO2ART, PO2ART, HCO3ART, O2SATART, LITERFLOW in the last 168 hours.No results for input(s): PHVEN, PCO2VEN, PO2VEN, HCO3VEN, O2SATVEN, LITERFLOW in the last 168 hours. I/O's:I have reviewed the patient's current I&O's as documented in the EMR.Gross Totals (Last 24 hours) at 03/10/2023 0901Last data filed at 03/10/2023 0548Intake 3900 ml Output 775 ml Net 3125 ml Net IO Since Admission: 3,125 mL [03/10/23 0901]Drips:Vasoactive: Norepinephrine: 0.04 mcg/kg/min Sedation: None Propofol:  Fentanyl:  Cardiac: None Dobutamine:  Amiodarone:  Esmolol:  Others: None Insulin:   RASS: 0-->alert and calm1IVF: s/p 2L IV bolusLines:  Periph IV 03/10/23 0000 metacarpal(dorsum of hand), right 22 gauge-Inserted by: NursePeriph IV 03/09/23 1205 dorsum left arm 20 gauge-Inserted by: Field/EMSPeriph IV 03/10/23 0100 upper arm left 18 gauge-Inserted by: Physician Assistant[REMOVED] Periph IV 03/09/23 1403 median cubital(antecubital fossa), right over-the-needle catheter system 18 gauge-Inserted by: PhysicianIleostomy (RLQ)Purewick cathteterDiet: Diet Cardiac DVT Prophylaxis:  Eliquis 5 mg BIDECG/Tele Events: NSRRhythm:  QTc: 0.43Medications: I have reviewed the patient's current meds as listed in the patient's EMR.SCHEDULED: Current Facility-Administered Medications Medication Dose Route Frequency Provider Last Rate Last Admin  apixaban (ELIQUIS) tablet 5 mg  5 mg Oral Q12H Thakker, Nisarg, MD      ceFEPIme (MAXIPIME) 2 g in sodium chloride 0.9% PF 20 mL (100 mg/mL)  2 g Intravenous Q8H Thakker, Nisarg, MD   2 g at 03/10/23 0539  chlorhexidine gluconate (HIBICLENS/BETASEPT) 4 % topical liquid   Topical (Top) Daily Mathews Robinsons, MD   Given at 03/10/23 0000  ethyl alcohol 62 % nasal swab 1 Application  1 Application Nasal Q12H Mathews Robinsons, MD   1 Application at 03/10/23 0000  famotidine (PEPCID) tablet 20 mg  20 mg Oral Daily Thakker, Rhea Belton, MD      [Held by provider] gabapentin (NEURONTIN) capsule 400 mg  400 mg Oral BID WC Thakker, Nisarg, MD      hydrocortisone sodium succinate (Solu-CORTEF) injection 50 mg  50 mg IV Push Q12H Parambath, Milas Kocher, MD      [Held by provider] hydroxychloroquine (PLAQUENIL) tablet 200 mg  200 mg Oral Daily Thakker, Rhea Belton, MD      [Held by provider] predniSONE (DELTASONE) tablet 2 mg  2 mg  Oral Daily Parambath, Milas Kocher, MD      rosuvastatin (CRESTOR) tablet 10 mg  10 mg Oral Nightly Thakker, Nisarg, MD      sodium chloride 0.9 % flush 3 mL  3 mL IV Push Q8H Thakker, Nisarg, MD   3 mL at 03/10/23 0537 INFUSIONS:  norepinephrine 0.04 mcg/kg/min (03/10/23 0110) PRN: acetaminophen, sodium chloride Exam: Physical ExamsGeneral: Chronically ill appearing, oriented to self and place but not timeEyes: Pupils equal and reactive. Extra ocular movements are intact.Ears: No ear discharge or deformity. Nose: Normal. Mouth: Mucosa has good color and appearanceNeck: veins are flat. Trachea is in the midline. Thyroid is normal. Chest: There is normal respiratory effort, no chest wall deformity or rib tenderness. There is no stridor. Lungs: CTAHeart: RRR, no murmurs, gallops or rubsAbdomen: Soft, non tender, not distended, no organomegaly or masses. Ostomy tube in place with green watery outputExtremities: No edema, pulses present.Skin: intact, no abnormalities and palpation of the skin and subcutaneous tissue is normal. No rash, lesions or bruises.Neurologic: Lower extremities contracted bilaterally, 2/5 muscle strength in bilateral LE and 1/5 muscle strength in UEStudies: Labs: 72h CBCRecent Labs   03/18/241248 03/18/242309 03/19/240555 WBC 16.1* 9.7 10.6 HGB 11.1* 9.8* 10.0* PLT 215 192 176 MCV 90.1 91.7 91.2  72h CoagRecent Labs   03/18/241236 LABPROT 13.1* INR 1.24*  72h BMPRecent Labs   03/18/241236 03/18/242309 03/19/240555 NA 138 140 138 K 4.3 4.1 4.0 CL 107 113* 113* CO2 18* 17* 18* ANIONGAP 13 10 7  CALCIUM 10.1 8.8 9.1 MG 1.8 1.7 1.8 PHOS  --  2.3 2.6 BUN 24* 23 22 CREATININE 1.04 0.97 0.93 Estimated Creatinine Clearance: 58 mL/min (by C-G formula based on SCr of 0.93 mg/dL). 72h CardiacNo results for input(s): TROPONINT, CKTOTAL, CKMBINDEX in the last 72 hours.Invalid input(s): CKMBMASS, PROBNP 72h LFTRecent Labs   03/18/241236 03/18/242309 03/19/240555 AST 32  --  75* ALT 8*  --  32 ALKPHOS 84  --  60 ALBUMIN 3.4*  --  2.9* PROT 8.4  --  6.8 BILITOT 0.3  --  0.6 LACTATE 1.5 3.2* 1.5  72h MetabolicRecent Labs   03/18/242309 TSH 0.527  Lab Results Component Value Date  SARSCOV2 Negative 03/09/2023 Recent Labs Lab 03/18/241236 LABPROT 13.1* INR 1.24* Recent Labs Lab 03/18/241236 BNPPRO 618.9* No results for input(s): INTERLEUKIN 6 in the last 168 hours.UrinalysisRecent Labs Lab 03/18/241322 SPECGRAV 1.032* PHUR 5.5 LEUKOCYTESUR 4+* NITRITE Positive* PROTEINUA 2+* KETONESU Negative BILIRUBINUR Negative UROBILINOGEN <2.0 Microbiology:Recent Labs Lab 03/18/241236 LABBLOO Abnormal Stain* - No Growth to Date Diagnostics:CXR portableResult Date: 03/09/2023  No active cardiopulmonary abnormalities.  No change. [facility] [employer] [facility]: Routine. Reported and signed by: Amie Critchley, MD  Electrocardiogram:Results for orders placed or performed during the hospital encounter of 03/09/23 EKG Result Value Ref Range  Heart Rate 81 bpm  QRS Interval 80 ms  QT Interval 370 ms  QTC Interval 429 ms  P Axis 53 deg  QRS Axis -2 deg  T Wave Axis 55 deg  P-R Interval 150 msec  SEVERITY Normal ECG severity Echocardiogram:No results found for this or any previous visit.Assessment/Plan: 67 y.o. 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 Diff assay negative-Stool pathogen PCR pending-Consider cholestyramine or Benefiber for high output from ileostomy-Will consult GI #Nephro:-Purewick catheter-Trend lytes daily-Repeat BMP at 1 pm-Monitor I/O-Consider Burnet A/P wo IV contrast#Endo:-Hold Prednisone 2 mg QD and Plaquenil for now; will resume tomorrow-Will taper Solu-Cortef 50 mg -TSH WNL#Heme/Onc:-Continue Eliquis 5 mg BID for DVT ppx-Trend CBC daily#ID:-Continue Cefepime and d/c Vanc-Blood culture from 3/18: GNR-urine culture from 3/18: NGTD-MRSA PCR negative-Trend body temp and WBC curveDispo: ICU for septic shock requiring pressorsCase and plan discussed with Tele-ICU attending Dr. Kathi Ludwig, MD and will be discussed with day-time attending Dr. Mitzi Hansen, Ronaldo Miyamoto* during morning rounds; attending addendum to follow.Signed: Mathews Robinsons, MD, PGY-3[facility]3/19/2024 at 11:33 PM

## 2023-03-10 NOTE — Procedures
Danielle [name] is a 67 y.o. female patient with difficult venous access.Name of procedure - extended dwell endurance catheter insertionIndication - poor venous accessLocation - Left upper extremity, basilicCatheter - 8 cm 18 gauge Under Ultrasound guidance, vein was identified. Patient was prepped in routine sterile manner per institution policy for peripheral catheter. Chosen vessel was accessed via sterile technique with Ultrasound.  Flashback was confirmed visually. Needletip and guidewire position were confirmed within vessel on longitutinal view. Catheter was fully advanced over the guidewire, and guidewire apparatus was removed.  Line was flushed forward with saline, and blood was aspirated via catheter.  Line was dressed per institution policy for peripheral intravenous lines, and should be cared for as peripheral IV. Gomez Cleverly, [place] 3/19/20246:17 AM

## 2023-03-10 NOTE — Other
Called patients sister, Ms. McCulley who is also listed as patients conservator to update her about patients clinical condition and need for ICU level of care because of Hypotension/Sepsis. She asked to do everything we have to do to get her through this crisis. Patient is confirmed to be  full code with all interventions.

## 2023-03-10 NOTE — Plan of Care
Plan of Care Overview/ Patient Status    Assumed pt care at approx 1130. Pt alert, disoriented to time. No complaints of pain. Afebrile, NSR on the monitor. MAP maintained >65, titrated off Norephinephrine gtt. PIV's in place. Saturating well on room air. Passed bedside swallow evaluation. Ileostomy in place draining liquid green stool. One episode of urine incontinence, purewick placed. See flowsheets. Problem: Adult Inpatient Plan of CareGoal: Plan of Care ReviewOutcome: Interventions implemented as appropriateGoal: Patient-Specific Goal (Individualized)Outcome: Interventions implemented as appropriateGoal: Absence of Hospital-Acquired Illness or InjuryOutcome: Interventions implemented as appropriateGoal: Optimal Comfort and WellbeingOutcome: Interventions implemented as appropriateGoal: Readiness for Transition of CareOutcome: Interventions implemented as appropriate Problem: Wound Healing ProgressionGoal: Optimal Wound HealingOutcome: Interventions implemented as appropriate Problem: Violence Risk or ActualGoal: Anger and Impulse ControlOutcome: Interventions implemented as appropriate Problem: Fall Injury RiskGoal: Absence of Fall and Fall-Related InjuryOutcome: Interventions implemented as appropriate

## 2023-03-10 NOTE — Other
[facility] Tele-ICU			[facility] HealthCrystal [id number]/1/1957Attending Physician: de Danielle [name]*Patient Location: [facility] [address]MED/CORONARY CARE UNITImpression and Plan: 16 woman, PMHx of RA on steroids and hydroxychloroquine, s/p hemocolectomy and ileostomy admitted now with septic shock, likely urinary source.Appears in no distress. Breathing comfortably on room air.Agree with broad antibiotics.Continue with small crystalloid boluses as tolerated to wean off norepinephrine.Reassess respiratory status frequently.Attestation: This patient is critically ill as indicated by the following: Septic Shock with Severe Sepsis.Real time two-way audio-visual technology used to visualize the patient and key medical equipment? YesThe patient has required  30 minutes of my undivided attention during the past 24 hours for one of the previously noted life threatening diagnoses. Participants at the Bedside:resident Role -: Patient assessmentMuhammad Shelbie [name], MD3/19/20246:06 AMInSight [address] [place], [place] [address]

## 2023-03-10 NOTE — Transfer Summaries
0700-1800. Received pt alert and disoriented to time. Able to follow commands. Complained of pain in the afternoon to lower extremities, requested tylenol, MD aware, pt reported relief from tylenol. Afebrile. NSR on tele, HR 60s-70s. SBPs 90s-100s. Stable on RA. Lungs diminished. On Cardiac diet, tolerating well, good PO intake. +BM via ileostomy, see flowsheets for output. +bowel sounds. Purwick in place, +UOP. Skin intact. Turned and repositioned Q2. Pt transferred with ICU RN to NW7 off tele, per ICU team orders with no adverse events. ZO1 RN at bedside when pt arrived. Transferred pt with belongings (pocket book with 2 dollars), pjs, and clothes. Pt reported having glasses, but no glasses were charted on arrival to ICU from admitting ICU RN. This RN called previous med surg floor to check if glasses were forgotten, no glasses were found. WR6 RN made aware, pt previously on NW7. Pt reports will follow up with brother and ECF regarding glasses.

## 2023-03-10 NOTE — Plan of Care
Plan of Care Overview/ Patient Status    Admitted for: AMS/Hypotension From [facility] SNF. Presenting with failure to thrive. Per facility, decreased PO intake. Refusing to eat or drink anything. Facility also reporting increased AMS since this am. Per EMS pt found to be hypoxic in 80s on RA, placed on 2L NC. Hx from chart: DVT, perforated diverticulitis, pneumoperitoneum, s/p right hemicolectomy/ileostomy, rheumatoid arthritis, peg placement and removal, anemia, right lower extremity cellulitis, dysphagia, encephalopathy, GERD, hypercalcemia, paraplegia, band hemorrhoidectomy, cystectomy, lap salpingoopherectomy, upper GI endoscopy. Chart reviewed. See flowsheet for documentation and interventions. Case Management Screening and Evaluation  Flowsheet Row Most Recent Value Case Management Screening: Chart review completed. If YES to any question below then proceed to CM Eval/Plan  Is there a change in their cognitive function No Do you anticipate that the pt will have any discharge needs requiring CM intervention? Yes Has there been a readmission within the last 30 days No Were there services prior to admission ( Examples: Assisted Living, HD, Homecare, Extended Care Facility, Methadone, SNF, Outpatient Infusion Center) Yes Negative/Positive Screen Positive Screening: Complete CM Evaluation and Plan Case Manager Attestation  I have reviewed the medical record and completed the above screen. CM staff will follow patient's progress and discuss the plan of care with the Treatment Team. Yes Case Management Evaluation and Plan  Arrived from prior to admission SNF (receiving long term care) Admitted from: [place] Bed Hold  yes Bed Hold Payor: T19 Bed Hold Expiration Date 03/24/23  Lives With Facility Resident Patient Requires Care Coordination Intervention Due To discharge planning needs/concerns, resumption of services prior to admission Prior to Hospitalization: Assistance Needed/DME being used Ambulation Ambulation Assistance/DME: manual wheelchair, lift device Documented Insurance Accurate Yes  [Verified via financial screens.] Any financial concerns related to anticipated discharge needs No Patient's home address verified Yes Source of Clinical History  Patient's clinical history has been reviewed and source of Information is: Medical record, Event organiser  I have reviewed the medical record and completed the above evaluation with the following recommendations. Yes Discharge Planning Coordination Recommendations  Discharge Planning Coordination Recommendations Skilled Nursing Facility for long term care Case Manager reviewed plan of care/ continuum of care need's with  Patient Representative   Case Management Plan  Flowsheet Row Most Recent Value Discharge Planning  Patient/Patient Representative goals/treatment preferences for discharge are:  Return to [facility]  Expected Discharge Date 03/17/23  Called and spoke to patient's conservator Jacquenette Shone. Introduced myself and explained the role of the Sports coach. Verbalizes understanding. [name] states that patient has been in [place] since Sept 2023. Had surgery in 2022 that incapacitated her. Now wheelchair bound and OOB with a hoyer. Referral sent to [facility]. T19 bed-hold. Anticipated D/C plan: Return to [facility] for long term care. CMT will continue to follow for medical readiness. 12:21p - Received a VM from [name]. She would like a referral sent to [facility]. Referral sent. Wallie Renshaw, RN, [facility] Manager - [facility]

## 2023-03-10 NOTE — Significant Event
MICU transfer, accepted to the Hospitalist admission/consult list. Elise Gladden PA-C

## 2023-03-10 NOTE — Other
RAPID EVALUATION NOTEREV TRIGGER: HypotensionHPI: Danielle [name] is a 67 y.o. female  has a past medical history of Acute embolism and thrombosis of unspecified femoral vein (HC Code), Anemia, Cellulitis and abscess of right lower extremity, Dysphagia, Encephalopathy, GERD (gastroesophageal reflux disease), Hypercalcemia, Paraplegia (HC Code), and Rheumatoid arthritis (HC Code).Marland KitchenRRT was activated for upon patients transfer to medical unit  due to Hypotension despite 2 L fluid resuscitation. MEDS Current:Current Facility-Administered Medications Medication Dose Route Frequency Provider Last Rate Last Admin  [START ON 03/10/2023] apixaban (ELIQUIS) tablet 5 mg  5 mg Oral Q12H Thakker, Nisarg, MD      ceFEPIme (MAXIPIME) 2 g in sodium chloride 0.9% PF 20 mL (100 mg/mL)  2 g Intravenous Q8H Thakker, Nisarg, MD   2 g at 03/09/23 2152  [START ON 03/10/2023] famotidine (PEPCID) tablet 20 mg  20 mg Oral Daily Thakker, Rhea Belton, MD      ferrous gluconate (FERGON) tablet 324 mg  324 mg Oral Every Other Day Serita Kyle, MD      [START ON 03/10/2023] gabapentin (NEURONTIN) capsule 400 mg  400 mg Oral BID WC Thakker, Nisarg, MD      hydrocortisone sodium succinate (Solu-CORTEF) injection 50 mg  50 mg IV Push Q6H Corliss Skains, MD   50 mg at 03/09/23 1936  [START ON 03/10/2023] hydroxychloroquine (PLAQUENIL) tablet 200 mg  200 mg Oral Daily Serita Kyle, MD      [START ON 03/10/2023] rosuvastatin (CRESTOR) tablet 10 mg  10 mg Oral Nightly Thakker, Nisarg, MD      sodium chloride 0.9 % (new bag) bolus 1,000 mL  1,000 mL Intravenous Once Renell Coaxum B, APRN      sodium chloride 0.9 % flush 3 mL  3 mL IV Push Q8H Thakker, Nisarg, MD     MEDS Infusion:Lines: PIV X2I/O's:Intake/Output Summary (Last 24 hours) at 03/09/2023 2255Last data filed at 03/09/2023 2227Gross per 24 hour Intake 2400 ml Output 25 ml Net 2375 ml PE:  Vitals: Temp:  [98 ?F (36.7 ?C)-102.3 ?F (39.1 ?C)] 98 ?F (36.7 ?C)Pulse:  [68-95] 86Resp:  [17-18] 18BP: (70-104)/(42-69) 83/50SpO2:  [85 %-100 %] 99 % SpO2:  [85 %-100 %] 99 % (03/18 2244)  GEN: Ill appearing, dry membranes, not in acute distress  NERUO:  Awake, alert and oriented.  No facial droop, cold extremities, Chronically limited movement in the upper extremities; contracted/flacid  lower extremities  CHEST: CTAB  CVS: RRR, -M/G/R, JVP aprox 2-3 cm above sternal border  ABD: +BS, soft, NT, deep mid abdominal scar, healed; left sided ileotomy draining clear green watery contents   EXT: -edema, +pedal pulsesLabs:Labs:Pending Labs and Tests: Pending Lab Results   Order Current Status  Urine culture In process  Blood culture Preliminary result  Blood culture Preliminary result  WRU:EAVWUJ Labs Lab 03/18/241248 WBC 16.1* HGB 11.1* PLT 215 MCV 90.1 WJX:BJYNWG Labs Lab 03/18/241236 03/18/242243 NA 138  --  K 4.3  --  CL 107  --  CO2 18*  --  ANIONGAP 13  --  CALCIUM 10.1  --  MG 1.8  --  GLU 114* 101* LACTATE 1.5  --  BUN 24*  --  CREATININE 1.04  --  		LFT: Recent Labs Lab 03/18/241236 AST 32 ALT 8* ALKPHOS 84 ALBUMIN 3.4* PROT 8.4 INR 1.24* BILITOT 0.3 Coag: Recent Labs Lab 03/18/241236 LABPROT 13.1* INR 1.24* Metabolic:No results for input(s): HGBA1C, TSH, FREET4, LDL, HDL, CHOL, TRIG in the last 168 hours.ABGs:No results found for: PHART, PCO2, PO2ART, HCO3ART, SAMPLETYPEECG/Tele Events: Results for orders  placed or performed during the hospital encounter of 03/09/23 EKG Result Value Ref Range  Heart Rate 87 bpm  QRS Interval 84 ms  QT Interval 374 ms  QTC Interval 450 ms  P Axis 49 deg  QRS Axis -3 deg  T Wave Axis 49 deg  P-R Interval 140 msec  SEVERITY Borderline ECG severity CXRAY:No active cardiopulmonary abnormalities.  No change. [facility]: Routine. ConclusionCLINICAL INFORMATION: ams TECHNIQUE: XR CHEST PA OR AP COMPARISON: 11/03/2022. FINDINGS: Heart/Mediastinum:  The cardiac silhouette is unchanged.  The mediastinal contours are relatively stable. Lungs:  No acute infiltrates.    No pleural effusions. Osseous Structures:  No acute abnormalities. 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 Sepsis work up included BC, UA, Chest Xray. She was found to have elevated WBC of 16, elevated procalitonin of 2.08, negative chest xray and UA positive for nitrate, leukocytes and bacteria. She was started on broad spectrum antibiotics. Her BP remained soft and intermittently low despite 2 L fluid resuscitation.RRT was activated immediately upon patients transfer to medical floor as patients BP was under 70 systolic. On exam, she was alert but ill appearing, with grayish color and cold extremities. Manual BP was 68/40. Patient was started on additional fluids and Levo.ICU was consulted as patient requires pressor support. Family was updated about worsening clinical status and the need for ICU level of care for septic shock. Sister, who is also listed as patients conservator expressed her wishes to do everything possible to help her get through this crisis.Patient was evaluated by ICU team who agreed to move her to ICU for further management of septic shock. I have reviewed patients EKG, labs, imaging as relevant in the chart Time spent with patient  35 minutes managing critical diagnosis of Shock/sepsis

## 2023-03-10 NOTE — Other
RRT NOTEA Rapid Response was called at 03/09/2023 10:45 PM.Noted clinical changes:  SBP 60'sVitals:Temperature: 99 F (37.2 C)Pulse: 78Respirations: 20Blood Pressure: 95/64Oxygen Saturation: 99 %Assessment: RN called concerned about patient w/ SBP 70's despite fluids in ER. Upon her arrival to the floor her SBP was 60's. She is awake, answering questions. Poor perfusion- skin cold, weak pulses. Following commands. Denies chest pain or SOB.Interventions put in place:  EKGFluid bolusLevo gttCBC, BMP, LacticPlan: MICU called and accepted. She was placed on the transport monitor, trip was uneventful. She was left in the care of the MICU team.

## 2023-03-10 NOTE — ED Notes
10:04 PM ED to Floor HandoffAdmission Dx:     Sepsis associated hypotension, acute cystitis without hematuria         Telemetry: 	[]  Yes		[x]  NoOxygen Tank on Stretcher >1000 PSI:  []  YesCode Status:   [x]  Full		[]  Partial		[]  No CodeIsolation: 	[x]  None	[]  Contact	[]  Droplet	[]  AirborneSafety Precautions: []  None	[]  Sitter   []  Restraints	[]  Suicidal	[]  Fall Risk	Other (specify):           Mentation/Orientation:    A&O (person, place, time and situation) x    1      	 		Disoriented to:  place, time, situation        Deficits: []  Hearing impaired	[]  Blind  	[]  Nonverbal		 []  ID/DD (intelectual disability/developmental disability)Ambulation: []  Ambulatory	[x]  Non-Ambulatory  Diet: [x]  OK to eat	[]  NPO	Other (specify):           IV Access: [x]  Yes   []  No     Vital signs Charted in the last hour: [x] Yes    []  No         Other (specify)            Skin Alteration:[]  Pressure Injury	[]  Wound	[x]  None	[]  Skin Not AssessedDiarrhea/Loose stool : []  1x within 24h  []  2x within 24h  []  3x within 24h  []  None      C.Diff Order:  []  Ordered- needs to be collected    []  Collected-sent to lab    []  Resulted - Negative C.Diff  []  Resulted - Positive C.Diff    []  Not Ordered   	[x]  N/APatient Belongings:Does the patient have belongings going to the floor?   [x] Yes    []  NoAre the belongings documented?          [] Yes    [x]  NoIs someone taking belongings home?   [] Yes    [x]  No   Who (specify):           _________Tauheedah Garfield Cornea, RNPlease call the ED RN if you have any questions            You can always call the [facility] ED charge nurse at [phone] if you have any concerns [facility] ED [facility] Emergency Department

## 2023-03-10 NOTE — ED Provider Notes
Chief Complaint Patient presents with  Failure To Thrive   From ECF. Hypotensive. Decrease appetite, SOB, 92% 2L nc   RESIDENT NOTE AND MDMHPI: Danielle [name] is 67 y.o. female with PMHx significant for DVT on Eliquis, RA, paraplegia, perforated diverticulitis and large volume pneumoperitoneum s/p excellent and right hemicolectomy/ileostomy presenting with altered mental status and hypotension.  Patient presenting to Trauma room from facility after they noticed her to be off her baseline since yesterday.  Patient's only acute medical complaint is pain in her heel.ZOX:WRUEA on the information available, my concerns include:  Sepsis, viral illness, pneumonia, UTIMDM:Plan on infectious workup including cultures and lactate.  IVF, broad-spectrum antibiotics, TylenolPatient febrile with leukocytosis of 16.1UA with 4+ leukocytes, 2+ blood, nitrite positivePCU consulted to evaluate patient for urosepsis with soft blood pressures.  Patient discussed with PCU who states they would like to give full bolus of IVF and reassess.Patient signed out to Dr. Jenne Pane pending re-evaluation after finishing IVF.  Likely admission to ICU. Patient was seen and evaluated with Dr. Lamarr Lulas, [facility] EMERGENCY MEDICINEAvailable through MHBThis note may have been generated using M*Modal voice dictation software please excuse any typographical errors due to flaws in voice recognition.03/09/2023 12:33 PMMDM  Physical ExamED Triage Vitals [03/09/23 1200]BP: (!) 84/56Pulse: (!) 92Pulse from  O2 sat: n/aResp: n/aTemp: 98 ?F (36.7 ?C)Temp src: n/aSpO2: (!) 92 % BP (!) 102/58  - Pulse 90  - Temp (!) 101.5 ?F (38.6 ?C) (Rectal)  - Resp 17  - SpO2 98% Physical ExamConstitutional:     General: She is not in acute distress.   Appearance: She is ill-appearing (Chronically). She is not diaphoretic. HENT:    Head: Normocephalic and atraumatic. Nose: Nose normal.    Mouth/Throat:    Mouth: Mucous membranes are dry.    Pharynx: Oropharynx is clear. Eyes:    Extraocular Movements: Extraocular movements intact.    Pupils: Pupils are equal, round, and reactive to light. Cardiovascular:    Rate and Rhythm: Normal rate and regular rhythm.    Pulses: Normal pulses. Pulmonary:    Effort: Pulmonary effort is normal. No respiratory distress.    Breath sounds: No wheezing, rhonchi or rales. Abdominal:    General: There is no distension.    Palpations: Abdomen is soft.    Tenderness: There is no abdominal tenderness. There is no guarding.    Comments: Ostomy bag in place. Genitourinary:   Rectum: Guaiac result negative. Musculoskeletal:    Cervical back: No tenderness.    Right lower leg: No edema.    Left lower leg: No edema.    Comments: Lower extremities contracted bilaterally Neurological:    General: No focal deficit present.    Mental Status: She is alert. She is disoriented.  12 Lead EKGPerformed by: Corliss Skains, MDAuthorized by: Corliss Skains, MD  Previous ECG:   Previous ECG:  Compared to current  Similarity:  No changeInterpretation:   Interpretation: non-specific  Rate:   ECG rate assessment: normal  Rhythm:   Rhythm: sinus rhythm  Ectopy:   Ectopy: none  Attestation/Critical CarePatient Reevaluation: Attending Supervised: ResidentI saw and examined the patient. I agree with the findings and plan of care as documented in the resident's note except as noted below. Additional acute and/or chronic problems addressed: 67 year old female with history of DVT on Eliquis, RA on Plaquenil and prednisone, colectomy here with concern for altered mental status and fever.  Patient unable to provide significant history.  No nausea vomiting.  Has not noticed a change in  her stool output.  No chest pain.  No abdominal pain.Exam: Notable for pallor, warm to touch (rectally febrile).  Breathing comfortably.A/P:  Fever - patient mildly hypotensive.  Concern for sepsis.  Will plan for fluid bolus and broad-spectrum antibiotics.  Workup revealed urinary source.  Patient intermittently responding to fluid boluses.  She has been on low dose 2 mg prednisone.  Unclear if this is enough to cause adrenal insufficiency in the setting of sepsis but will trial Solu-Cortef.  Awaiting response from ICU.  Patient is currently full code. Raquel F HarrisonCritical care provided by attending: critical careCritical care time (minutes): 60.Critical care time was exclusive of separately billable procedures and teaching time.Critical care was necessary to treat or prevent imminent or life-threatening deterioration of the following conditions:  Sepsis.Critical care was time spent personally by me on the following activities: blood draw for specimens, dehydration, development of treatment plan with patient or surrogate, discussions with consultants, evaluation of patient's response to treatment, examination of patient, obtaining history from patient or surrogate, ordering and performing treatments and interventions, ordering and review of laboratory studies, ordering and review of radiographic studies, pulse oximetry, re-evaluation of patient's condition and review of old charts.Sepsis DocumentationRe-perfusion exam performed: 2 hours after arrivalFluid bolus dosed using IBW (patient meets criteria for obesity BMI>30)Clinical Impressions as of 03/09/23 2033 Sepsis associated hypotension (HC Code) Acute cystitis without hematuria  ED DispositionAdmit7:51 PM:  I, Dr. Julian Reil, received signout from Dr. Romeo Apple.  In short patient is 67 year old female here with urosepsis.  Soft blood pressures.  Receiving stress dose steroids.  Status post antibiotics.  Pending medical ICU evaluation, admission.8:33 JY:NWGNFAO now with normal blood pressure.  Evaluated by both progressive care unit team, and medical ICU team, both agree patient is clear for floor.  Discussed with hospitalist team who agrees.  We will admit to floor. Flonnie Hailstone, MDResident03/18/24 1719 Corliss Skains, MD03/18/24 1846 Theresia Majors, MD03/18/24 2033

## 2023-03-10 NOTE — ED Notes
7:19 PM Assumed care of the patient alert and oriented with reports of failure to thrive. Visitor at bedside. 8:06 PMZosyn 4.5 g initiated and infusing as ordered. Patient repositioned for comfort. NS 500 mL bolus infusing. Patient alert and responding to verbal stimuli. 9:27 PMIncontinent care/ colostomy care provided. Rectal Temp 102.3, acetaminophen suppository administered.10:00 PM Temp reassessed after suppository 99.8 rectally. %00 mL bolus NS administered. Patient awake and talking. Patient alert and orient x 3 disoriented to situation. Patient spoke with sister/conservator [name]) on phone. Transport here to pick patient up.

## 2023-03-10 NOTE — Consults
[facility] Gastroenterology Consult Note 03/10/2023 Consultation requested by: Jeraldine Loots for consultation: Increased Ostomy OutputSource of Information: Patient and EMR/Previous RecordHistory of Present Illness:  Danielle [name] is a 67 y.o. 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 and hypotensive.  Laboratory findings are significant for leukocytosis of 16.1, positive urinalysis, and blood cultures positive for Gram-negative rods.  Originally patient was found to be volume responsive however that was subsequently admitted to the medical ICU for septic shock.During admission, patient reported high output from ileostomy green colored watery and nonbloody liquid stool for 1 month.  GI was consulted for further recommendations.Patient reported chronic loose watery output from ileostomy for more than 1 month.  Patient reports she changes her ileostomy bag about 3 times per day.  Patient reports no abdominal pain, nausea, vomiting.  Patient does report heartburn that has worsened over the last week.  Otherwise of GI bleeding. Medical History: Past Medical History: Diagnosis Date  Acute embolism and thrombosis of unspecified femoral vein (HC Code)   Anemia   Cellulitis and abscess of right lower extremity   Dysphagia   Encephalopathy   GERD (gastroesophageal reflux disease)   Hypercalcemia   Paraplegia (HC Code)   Rheumatoid arthritis (HC Code)   No family history on file. Past Surgical History: Procedure Laterality Date  BAND HEMORRHOIDECTOMY    CYSTECTOMY  1972  LAPAROSCOPIC SALPINGOOPHERECTOMY  1986  LAPAROTOMY 2022  PARTIAL COLECTOMY  2022  PEG TUBE PLACEMENT  01/2022  UPPER GASTROINTESTINAL ENDOSCOPY      Allergies as of 03/09/2023  (No Known Allergies) Home Medications: Medications Prior to Admission Medication Sig Dispense Refill Last Dose  apixaban (ELIQUIS) 5 mg tablet 1 tablet (5 mg total) by Per G Tube route every 12 (twelve) hours.     diclofenac (VOLTAREN) 1 % gel Apply 4 g topically 4 (four) times daily. 100 g 2   famotidine (PEPCID) 20 mg tablet Take 1 tablet (20 mg total) by mouth daily.     ferrous gluconate (FERGON) 324 mg (38 mg iron) tablet Take 1 tablet (324 mg total) by mouth every other day.     gabapentin (NEURONTIN) 400 mg capsule Take 1 capsule (400 mg total) by mouth 2 (two) times daily with breakfast and dinner.     gabapentin (NEURONTIN) 600 mg tablet Take 1 tablet (600 mg total) by mouth nightly.     hydrOXYchloroQUINE (PLAQUENIL) 200 mg tablet Take 1 tablet (200 mg total) by mouth daily. 14 tablet 0   insulin lispro (ADMELOG, HUMALOG) 100 unit/mL vial Inject under the skin 3 (three) times daily before meals. Use per sliding scale.     lidocaine 4 % topical patch Place 1 patch onto the skin every 24 hours. Remove & Discard patch within 12 hours or as directed by MD 30 patch    melatonin 3 mg tablet Take 1 tablet (3 mg total) by mouth nightly.  0   nystatin (MYCOSTATIN) 100,000 unit/gram powder Apply 1 Application  topically 2 (two) times daily. 15 g    ondansetron (ZOFRAN-ODT) 4 mg disintegrating tablet Place 1 tablet (4 mg total) onto the tongue every 8 (eight) hours as needed for nausea.     rosuvastatin (CRESTOR) 10 mg tablet 1 tablet (10 mg total) by  Per G Tube route nightly.     traMADoL (ULTRAM) 50 mg tablet Take 1 tablet (50 mg total) by mouth every 6 (six) hours as needed for pain.     ferrous sulfate 324 mg (65 mg iron) delayed release tablet Take 1 tablet (324 mg total) by mouth daily.     fluticasone propionate (FLONASE) 50 mcg/actuation nasal spray Use 1 spray in each nostril daily. 16 g 11   predniSONE (DELTASONE) 1 mg tablet Take 2 tablets (2 mg total) by mouth daily. Take with food.    Current Medications Current Facility-Administered Medications Medication Dose Route Frequency Provider Last Rate Last Admin  apixaban (ELIQUIS) tablet 5 mg  5 mg Oral Q12H Thakker, Nisarg, MD   5 mg at 03/10/23 0959  ceFEPIme (MAXIPIME) 2 g in sodium chloride 0.9% PF 20 mL (100 mg/mL)  2 g Intravenous Q8H Thakker, Nisarg, MD   2 g at 03/10/23 0539  chlorhexidine gluconate (HIBICLENS/BETASEPT) 4 % topical liquid   Topical (Top) Daily Mathews Robinsons, MD   Given at 03/10/23 0000  ethyl alcohol 62 % nasal swab 1 Application  1 Application Nasal Q12H Mathews Robinsons, MD   1 Application at 03/10/23 0959  famotidine (PEPCID) tablet 20 mg  20 mg Oral Daily Serita Kyle, MD   20 mg at 03/10/23 0959  [Held by provider] gabapentin (NEURONTIN) capsule 400 mg  400 mg Oral BID WC Thakker, Nisarg, MD      hydrocortisone sodium succinate (Solu-CORTEF) injection 50 mg  50 mg IV Push Q12H Parambath, Milas Kocher, MD      [Held by provider] hydroxychloroquine (PLAQUENIL) tablet 200 mg  200 mg Oral Daily Thakker, Rhea Belton, MD      [Held by provider] predniSONE (DELTASONE) tablet 2 mg  2 mg Oral Daily Parambath, Milas Kocher, MD      rosuvastatin (CRESTOR) tablet 10 mg  10 mg Oral Nightly Thakker, Nisarg, MD      sodium chloride 0.9 % flush 3 mL  3 mL IV Push Q8H Thakker, Nisarg, MD   3 mL at 03/10/23 0537  norepinephrine 0.04 mcg/kg/min (03/10/23 1100) acetaminophen, sodium chlorideObjective: Vitals:Last 24 hours: Temp:  [97.8 ?F (36.6 ?C)-102.3 ?F (39.1 ?C)] 98.2 ?F (36.8 ?C)Pulse:  [58-95] 68Resp:  [12-21] 13BP: (68-134)/(40-95) 97/63SpO2:  [96 %-100 %] 97 %Weight: 63.6 kg Physical Exam:Physical ExamHENT:    Head: Normocephalic and atraumatic. Pulmonary:    Effort: Pulmonary effort is normal. Abdominal:    General: There is no distension.    Palpations: Abdomen is soft.    Tenderness: There is no abdominal tenderness. There is no guarding or rebound.    Comments: + Ileostomy draing liquid brown, nonbloody stool. + colostomy Skin:   General: Skin is warm and dry.   Labs: Recent Labs Lab 03/18/241236 03/18/241236 03/18/241248 03/18/242309 03/19/240555 WBC  --   --  16.1* 9.7 10.6 HGB  --    < > 11.1* 9.8* 10.0* HCT  --    < > 33.70* 30.90* 31.10* PLT  --    < > 215 192 176 NEUTROPHILS  --   --  87.5*  --  94.0* NA 138  --   --  140 138 K 4.3  --   --  4.1 4.0 CL 107  --   --  113* 113* CO2 18*  --   --  17* 18* BUN 24*  --   --  23 22 CREATININE 1.04  --   --  0.97 0.93 ANIONGAP 13  --   --  10 7  < > = values in this interval not displayed.  Recent Labs Lab 03/18/241236 03/18/241236 03/18/242309 03/19/240555 ALT 8*  --   --  32 AST 32  --   --  75* ALKPHOS 84  --   --  60 BILITOT 0.3  --   --  0.6 PROT 8.4  --   --  6.8 ALBUMIN 3.4*  --   --  2.9* CALCIUM 10.1  --  8.8 9.1 MG 1.8  --  1.7 1.8 PHOS  --    < > 2.3 2.6 LABPROT 13.1*  --   --   --  INR 1.24*  --   --   --   < > = values in this interval not displayed.  Recent Labs Lab 03/18/241236 03/18/242243 03/18/242309 03/18/242337 03/19/240516 03/19/240555 GLU 114* 101* 98 111* 102* 113* Recent Labs Lab 03/18/242309 HGBA1C 5.5 Recent Labs Lab 03/18/242309 TSH 0.527 No results for input(s): CHOL, HDL, LDL, TRIG in the last 168 hours.Invalid input(s): CHOLHDLRAT, LDLHDLRATUARecent Labs Lab 03/18/241322 SPECGRAV 1.032* PHUR 5.5 LEUKOCYTESUR 4+* NITRITE Positive* PROTEINUA 2+* GLUCOSEU Negative KETONESU Negative BILIRUBINUR Negative UROBILINOGEN <2.0 Microbiology: Blood CulturesRecent Labs Lab 03/18/241236 LABBLOO Abnormal Stain* - No Growth to Date  Diagnostics: CXR portableResult Date: 03/09/2023  No active cardiopulmonary abnormalities.  No change. [facility] Communications Center: Routine. Reported and signed by: Amie Critchley, MD  Impression/Recommendations: Danielle [name] is a 67 y.o. 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  Currently off pressors.  Clinically improved.On admission patient reported 1 month of high volume output from ileostomy.  Consulted for recommendations.#Ileostomy Output:  Patient reporting need for ileostomy bag changes about 3 times per day.  Patient noted this frequency has remained consistent since original surgery in 2022.  Stool infectious workup currently negative.  Unclear if patient is truly having high output from ileostomy.  Up to 1500 mL of fluid output per 24 hour is normal.  Given stool infectious workup is negative, would continue to monitor output. Ddx also includes short gut syndrome.  Can start on agents such as psyllium husk or guar gum and assess clinical response. Can start with these conservative measure and if output does not improve can start loperamide (prn vs standing regimen). - fiber supplementation with psyllium husk or guar gum- daily PPI for heartburn- document output from ostomy - consider loperamide if patient fails to response to dietary supplements. The above recommendations are not finalized until discussed with and co-signed by the attending provider, Dr. Andi Hence. Thank you for allowing [place] to participate in the continued care of Danielle [name]. We will continue to follow along with you. Signed:Jamil Jon Gills, MDGastroenterology FellowMobile heartbeat: [phone]: Portions of this document may have been transcribed using speech recognition software. Despite attempts at proofreading this document may contain transcription errors. Please do not hesitate to contact this writer to request clarification.  Attending StatementI have seen/evaluated the patient and agree with the history, physical exam and plan as documented by the Fellow Electronically Signed:Blu Lori AS Yama Nielson, MD3/19/20242:11 PM

## 2023-03-10 NOTE — Transfer Summaries
[facility] Transfer NoteDate: 3/19/2024Attending Provider: Mitzi Hansen, Hessie Diener, MD [phone] of admission: 3/18/2024Hospital Course Danielle [name] is a 67 y.o. female with medical history significant for RA on Plaquenil and Prednisone 2 mg, DVT on Eliquis, perforated diverticulitis s/p ex Lap/R hemicolectomy with ileostomy in 10/2021 who presented to [facility] ED on 03/09/23 with AMS and hypotension. Patient presented to ED from [facility] after found to be hypotensive along with decreased oral intake and heel pain for 1 day.  Patient also noted high output from ileostomy with green-colored watery and non-bloody diarrhea for 1 month. She denied any nausea, vomiting, abdominal pain, fever, chills or urinary symptoms.  On arrival to ED, initial vitals were Temp 86F, HR 92, BP 84/56 mmHg and SpO2 96% on RA. Admission labs were notable for cr 1.04 (baseline 0.4), LFT WNLs, proBNP 618, troponin flat x3, lactate 1.5, WBC 16.1, Hb 11.1 and plt 215. MRSA PCR, COVID, Influenza and RSV were negative. ECG with NSR, no acute ST or T wave changes. UA showed 2+ protein, 2+ blood, 4+ WBC, positive nitrite and many bacteria. Urine culture NGTD and blood culture growing GNR. CXR was negative for acute pathology. During ED course, patient received Solu-Cortef 50 mg x1, Vanc/Zosyn and total 2L IV bolus. Given improvement in BP to 102/58 mmHg, patient was initially admitted to medical floor. Patient was started on Vanc, Cefepime and Solucortef 50 mg Q6hrs IV.  During the floor course, patient became progressively more hypotensive to 68/40 mmHg on 03/09/23  night for which RRT was called. Patient was started on peripheral Levophed along with 1L NS bolus and transferred to MICU for further management of septic shock. MICU coursePatient's BP improved and she is off from Levophed since 1 am  (03/10/23). Patient is now stable and can be transferred to the medical floor. Assessment / Plan 67 y.o. female with medical history significant for Rheumatoid arthritis on Plaquenil and Prednisone 2 mg, DVT on Eliquis, perforated diverticulitis s/p ex Lap/R hemicolectomy with ileostomy in 10/2021 admitted with the following;#Septic Shock(improved) secondary to [facility] -Urosepsis#AKI likely secondary to ATN#Encephalopathy iso Urosepsis(improved)Recommendation-Monitor mental status, Blood pressure-Follow ECHO-GI evaluated for ileostomy output-recommended fibre supplementation and PPI for heart burn-Taper off Solu-Cortef 50 mg(last dose tonight)-Resume her home meds Prednisone 2 mg and Plaquenil tomorrow -Blood culture with Gram negative rods, Urine culture with Ecoli-Continue Cefepime (D2), adjust antibiotics appropriatelyDVT PPx: ApixabanGI PPx: Pantoprazole 40 mg DailyCODE STATUS: Full codePatient signed out to Hospitalist Service at  4.07 pm on 03/19/24Discussed with Attending Provider: Mitzi Hansen, Hessie Diener, MD [phone] Jonita Albee, MDPG Y-1, Internal MedicineMarch 19, 2024 4:08 PM

## 2023-03-10 NOTE — Other
[facility]PC[facility] consult noteDate: 3/18/2024Attending Provider: Corliss Skains, Abagail Limb [phone] of admission: 3/18/2024Reason for PCU consult:  Low blood pressure due to urosepsisHPI:Danielle [name] is a 67 y.o. female with a past medical history of rheumatoid arthritis, paraplegia, DVT on Eliquis, perforated diverticulitis and large volume pneumoperitoneum status post right hemicolectomy who was admitted to [facility] on 03/09/2023 with altered mental status and hypotension. From ECF with hypotension.  Decreased appetite, shortness on breath since yesterday.  Patient patient was sent to trauma room.  Patient's only complaint was pain in her heel.  In the ED patient was febrile to 101.5? F, blood pressure was 84/56, 100% on room air.  UA was stent with the UTI with leukocytosis.  Patient is being treated for urosepsis with low blood pressure by the IV fluids and broad-spectrum antibiotics (vanc and Zosyn).  Baseline lactate 1.5.  Blood culture pending. During my evaluation patient was alert to self and following command, baseline paraplegia, blood pressure soft, getting IV antibiotics and fluid bolus.  She denies any chest pain, shortness on breath, abdominal pain. Past medical history Past surgical history Past Medical History: Diagnosis Date  Acute embolism and thrombosis of unspecified femoral vein (HC Code)   Anemia   Cellulitis and abscess of right lower extremity   Dysphagia   Encephalopathy   GERD (gastroesophageal reflux disease)   Hypercalcemia   Paraplegia (HC Code)   Rheumatoid arthritis (HC Code)   Past Surgical History: Procedure Laterality Date  BAND HEMORRHOIDECTOMY    CYSTECTOMY  1972  LAPAROSCOPIC SALPINGOOPHERECTOMY  1986  LAPAROTOMY  2022  PARTIAL COLECTOMY  2022  PEG TUBE PLACEMENT  01/2022  UPPER GASTROINTESTINAL ENDOSCOPY    Social history Family history Social History Tobacco Use  Smoking status: Not on file  Smokeless tobacco: Not on file Substance Use Topics  Alcohol use: Not on file Social History Substance and Sexual Activity Alcohol Use None  Social History Substance and Sexual Activity Drug Use Not on file  Occupational History  Not on file  Marital status: Single No family history on file. Outpatient medications No outpatient medications have been marked as taking for the 03/09/23 encounter [facility] Encounter).  Allergies No Known Allergies Physical examination: Vitals:Last 24 hours: Temp:  [98 ?F (36.7 ?C)-101.5 ?F (38.6 ?C)] 101.5 ?F (38.6 ?C)Pulse:  [68-92] 68Resp:  [17-18] 18BP: (80-104)/(50-69) 102/58SpO2:  [85 %-100 %] 98 % General: NAD, lying in bed comfortable, A and oriented to self, slow speech, on RAHEENT: PERRLA, anicteric, oral mucosa dry.Neck: supple, JVP 1+5 cm H2O, no JVD, no bruits.Chest: CTA B/L, no W/R/C.Heart: RRR, S1/S2,  no S3/S4,  no M/G/R.Abdominal: BS normoactive, soft, ND, NT, no hepatosplenomegaly. Right sided colostomySkin/Extremities: no rash, radial pulse 2+ B/L, DP 2+ B/L, capillary refill 2, no LE edema.Neurological: CN II-XII grossly intact, power 5/5 throughout, no focal deficits.Objective data:  I/O's:Gross Totals (Last 24 hours) at 03/09/2023 1632Last data filed at 03/09/2023 1627Intake 350 ml Output 25 ml Net 325 ml ECG/telemetry: Normal sinusLaboratory studies: 72h CBCRecent Labs   03/18/241248 WBC 16.1* HGB 11.1* PLT 215 MCV 90.1  72h CoagRecent Labs   03/18/241236 LABPROT 13.1* INR 1.24*  72h BMPRecent Labs   03/18/241236 NA 138 K 4.3 CL 107 CO2 18* ANIONGAP 13 CALCIUM 10.1 MG 1.8 BUN 24* CREATININE 1.04 CrCl cannot be calculated (Unknown ideal weight.). 72h CardiacNo results for input(s): TROPONINT, CKTOTAL, CKMBINDEX in the last 72 hours.Invalid input(s): CKMBMASS, PROBNP 72h LFTRecent Labs   03/18/241236 AST 32 ALT 8* ALKPHOS 84 ALBUMIN  3.4* PROT 8.4 BILITOT 0.3 LACTATE 1.5  72h MetabolicNo results for input(s): CORTISOL, HGBA1C, TSH, FREET4, T3TOTAL, LDL, HDL, CHOL, TRIG in the last 72 hours. 72h GlucoseRecent Labs   03/18/241236 GLU 114* 72h UARecent Labs Lab 03/18/241322 SPECGRAV 1.032* PHUR 5.5 LEUKOCYTESUR 4+* NITRITE Positive* PROTEINUA 2+* GLUCOSEU Negative KETONESU Negative BILIRUBINUR Negative UROBILINOGEN <2.0 Diagnostics:CXR portableResult Date: 03/09/2023  No active cardiopulmonary abnormalities.  No change. [facility] Communications Center: Routine. Reported and signed by: Amie Critchley, Inell Mimbs  Microbiology:Recent Labs Lab 03/18/241236 LABBLOO No Growth to Date - No Growth to Date  Electrocardiogram:Results for orders placed or performed during the hospital encounter of 03/09/23 EKG Result Value Ref Range  Heart Rate 87 bpm  QRS Interval 84 ms  QT Interval 374 ms  QTC Interval 450 ms  P Axis 49 deg  QRS Axis -3 deg  T Wave Axis 49 deg  P-R Interval 140 msec  SEVERITY Borderline ECG severity Echocardiogram:No results found for this or any previous visit.Assessment  67 y.o. female  who came to the ED with the following active problems: # Sepsis with low BP due to UTI# Baseline paraplegiaRecommendation:# Full septic bolus and IV antibiotics# Reach out to Conservator for GOC and ask about IV pressor if needed. # Consider Abdominal imaging. At this moment patient need triage in the ED after volume resuscitation. Main concern is low BP which needs to be checked after fluid bolus. As PCU is not appropriate if patient needs IV pressor, ED resident and attendings are aware for further disposition depending on the patient clinical condition after all sepsis treatment. CODE STATUS: Full CodeThis case is to be discussed with Dr. Burnis Medin, Sundeep Destin - please refer to his addendum for the final recommendations.  Junious Ragone Graciella Belton, MBBS, MDPGY3Internal Medicine

## 2023-03-10 NOTE — H&P
[facility]Bridgeport HospitalHospitalist service History & Physical NoteName: Danielle [name] Age: 67 y.o.DoB: 04-27-1957Sex: femaleMRN: [name] Adm: 03/09/2023 12:01 PMHistory provided by:  Chart review, spoke with patient's conservator/sister Danielle [name].Chief complaint:  Altered mental statusHistory of presenting illness: Danielle [name] is a 67 y.o. female with a past medical history of DVT on Eliquis, RA, history of perforated diverticulitis and large volume pneumoperitoneum status post right hemicolectomy/ileostomy,  who presented to [facility] hospital on 03/09/2023 with concerns of altered mental status and hypotension.  Patient herself does not have any chest pain, shortness of breath, abdominal pain, nausea or vomiting.  She mostly answers in yes or no.  She has been on chronic prednisone for rheumatoid arthritis.  In the ER, PCU was initially consulted, and full sepsis bolus and IV antibiotics were recommended.  PCU was not consulted again.  Patient's most recent blood pressure 102/58.  ICU was consulted, and considering the BP, medicine floor admission was recommended.She was in the hospital in [place], last February, and had the G tube, and was taken out a few months ago. She needs extensive physical therapy, she hasn't been able to move arms or legs. Past medical history Past surgical history Past Medical History: Diagnosis Date  Acute embolism and thrombosis of unspecified femoral vein (HC Code)   Anemia   Cellulitis and abscess of right lower extremity   Dysphagia   Encephalopathy   GERD (gastroesophageal reflux disease)   Hypercalcemia   Paraplegia (HC Code)   Rheumatoid arthritis (HC Code)   Past Surgical History: Procedure Laterality Date  BAND HEMORRHOIDECTOMY    CYSTECTOMY  1972  LAPAROSCOPIC SALPINGOOPHERECTOMY  1986  LAPAROTOMY  2022  PARTIAL COLECTOMY  2022  PEG TUBE PLACEMENT  01/2022  UPPER GASTROINTESTINAL ENDOSCOPY    Social history Family history Social History Tobacco Use  Smoking status: Not on file  Smokeless tobacco: Not on file Substance Use Topics  Alcohol use: Not on file Social History Substance and Sexual Activity Alcohol Use None  Social History Substance and Sexual Activity Drug Use Not on file  Occupational History  Not on file  Marital status: Single No family history on file. Outpatient medications Outpatient Medications Marked as Taking for the 03/09/23 encounter [facility] Encounter) Medication Sig Dispense Refill  apixaban (ELIQUIS) 5 mg tablet 1 tablet (5 mg total) by Per G Tube route every 12 (twelve) hours.    diclofenac (VOLTAREN) 1 % gel Apply 4 g topically 4 (four) times daily. 100 g 2  famotidine (PEPCID) 20 mg tablet Take 1 tablet (20 mg total) by mouth daily.    ferrous gluconate (FERGON) 324 mg (38 mg iron) tablet Take 1 tablet (324 mg total) by mouth every other day.    gabapentin (NEURONTIN) 400 mg capsule Take 1 capsule (400 mg total) by mouth 2 (two) times daily with breakfast and dinner.    gabapentin (NEURONTIN) 600 mg tablet Take 1 tablet (600 mg total) by mouth nightly.    hydrOXYchloroQUINE (PLAQUENIL) 200 mg tablet Take 1 tablet (200 mg total) by mouth daily. 14 tablet 0  insulin lispro (ADMELOG, HUMALOG) 100 unit/mL vial Inject under the skin 3 (three) times daily before meals. Use per sliding scale.    lidocaine 4 % topical patch Place 1 patch onto the skin every 24 hours. Remove & Discard patch within 12 hours or as directed by MD 30 patch   melatonin 3 mg tablet Take 1 tablet (3 mg total) by mouth nightly.  0  nystatin (MYCOSTATIN) 100,000 unit/gram powder Apply 1  Application  topically 2 (two) times daily. 15 g   ondansetron (ZOFRAN-ODT) 4 mg disintegrating tablet Place 1 tablet (4 mg total) onto the tongue every 8 (eight) hours as needed for nausea.    rosuvastatin (CRESTOR) 10 mg tablet 1 tablet (10 mg total) by Per G Tube route nightly.    traMADoL (ULTRAM) 50 mg tablet Take 1 tablet (50 mg total) by mouth every 6 (six) hours as needed for pain.    Allergies No Known Allergies Medications: SCHEDULED: Current Facility-Administered Medications Medication Dose Route Frequency Provider Last Rate Last Admin  [START ON 03/10/2023] apixaban (ELIQUIS) tablet 5 mg  5 mg Oral Q12H Kacen Mellinger, MD      ceFEPIme (MAXIPIME) 1 g in sodium chloride 0.9% PF 10 mL (100 mg/mL)  1 g Intravenous Q12H Serita Kyle, MD      [START ON 03/10/2023] famotidine (PEPCID) tablet 20 mg  20 mg Oral Daily Charnele Semple, MD      ferrous gluconate (FERGON) tablet 324 mg  324 mg Oral Every Other Day Serita Kyle, MD      [START ON 03/10/2023] gabapentin (NEURONTIN) capsule 400 mg  400 mg Oral BID WC Kieron Kantner, MD      hydrocortisone sodium succinate (Solu-CORTEF) injection 50 mg  50 mg IV Push Q6H Corliss Skains, MD   50 mg at 03/09/23 1936  [START ON 03/10/2023] hydroxychloroquine (PLAQUENIL) tablet 200 mg  200 mg Oral Daily Serita Kyle, MD      [START ON 03/10/2023] rosuvastatin (CRESTOR) tablet 10 mg  10 mg Oral Nightly Serita Kyle, MD      [START ON 03/10/2023] Vancomycin MAR Level   Intravenous Once Corliss Skains, MD      Vancomycin Therapy Placeholder   Intravenous placeholder Corliss Skains, MD     INFUSIONS: PRN:  Review of systems: Negative other than mentioned in HPI.Vital signs and objective data: Vital signs:Last 24 hours: Temp:  [98 ?F (36.7 ?C)-101.5 ?F (38.6 ?C)] 101.5 ?F (38.6 ?C)Pulse:  [68-92] 90Resp:  [17-18] 17BP: (80-104)/(50-69) 102/58SpO2:  [85 %-100 %] 98 % I/O's:Gross Totals (Last 24 hours) at 03/09/2023 2117Last data filed at 03/09/2023 2039Intake 1850 ml Output 25 ml Net 1825 ml ECG/telemetry: - NSRPhysical examination: General: NAD, lying in bed comfortable, alert, not oriented to time or place. Was able to say the name Danielle [name]. HEENT: PERRLA, anicteric, oral mucosa moist. Neck: supple, no JVD, no bruits.Chest: CTA B/L, no W/R/C.Heart: RRR, S1/S2,  no S3/S4,  no M/G/R.Abdominal: BS normoactive, soft, ND, NT, no hepatosplenomegaly.Skin/Extremities: no rash, no LE edema.Neurological: CN II-XII grossly intact, not moving b/l upper or lower extremities. This is chronic as per sister. Laboratory studies: 72h CBCRecent Labs   03/18/241248 WBC 16.1* HGB 11.1* PLT 215 MCV 90.1  72h CoagRecent Labs   03/18/241236 LABPROT 13.1* INR 1.24*  72h BMPRecent Labs   03/18/241236 NA 138 K 4.3 CL 107 CO2 18* ANIONGAP 13 CALCIUM 10.1 MG 1.8 BUN 24* CREATININE 1.04 CrCl cannot be calculated (Unknown ideal weight.). 72h CardiacNo results for input(s): TROPONINT, CKTOTAL, CKMBINDEX in the last 72 hours.Invalid input(s): CKMBMASS, PROBNP 72h LFTRecent Labs   03/18/241236 AST 32 ALT 8* ALKPHOS 84 ALBUMIN 3.4* PROT 8.4 BILITOT 0.3 LACTATE 1.5  72h MetabolicNo results for input(s): CORTISOL, HGBA1C, TSH, FREET4, T3TOTAL, LDL, HDL, CHOL, TRIG in the last 72 hours. 72h GlucoseRecent Labs   03/18/241236 GLU 114* 72h UARecent Labs Lab 03/18/241322 SPECGRAV 1.032* PHUR 5.5 LEUKOCYTESUR 4+* NITRITE Positive* PROTEINUA 2+* GLUCOSEU Negative KETONESU Negative BILIRUBINUR Negative  UROBILINOGEN <2.0 Diagnostics:CXR portableResult Date: 03/09/2023  No active cardiopulmonary abnormalities.  No change. [facility] Communications Center: Routine. Reported and signed by: Amie Critchley, MD  Microbiology:Recent Labs Lab 03/18/241236 LABBLOO No Growth to Date - No Growth to Date  Electrocardiogram:Results for orders placed or performed during the hospital encounter of 03/09/23 EKG Result Value Ref Range Heart Rate 87 bpm  QRS Interval 84 ms  QT Interval 374 ms  QTC Interval 450 ms  P Axis 49 deg  QRS Axis -3 deg  T Wave Axis 49 deg  P-R Interval 140 msec  SEVERITY Borderline ECG severity Echocardiogram:No results found for this or any previous visit.Assessment & plan: Sepsis likely secondary to UTIAltered mental statusRheumatoid arthritis History of perforated diverticulitis status post ileostomy History of DVT Presented with complaints of AMS, hypotension, found to have UTI, concerning for sepsis likely secondary to UTI.  Patient does have encephalopathy, answering in yes/no without any meaningful interaction.She was started on Zosyn, vancomycin.  We will change to vancomycin and cefepime for now.  Continue stress dose steroids for hypotension and concerns for adrenal insufficiency, considering chronic steroid use.Continue home Eliquis, famotidine, ferrous gluconate, gabapentin, hydroxychloroquine, statin.Antibiotics: - vancomycin and cefepimeLines and catheters: - PIVDiet: - CardiacGI prophylaxis: - NoneDVT prophylaxis: -EliquisFamily update: - Yes discussed with conservator/sister Danielle [name] McCulley, and had a clear discussion that the patient is very sick.  If her blood pressure continues to drop, she might need ICU level of care.Consults: VANCOMYCIN, PHARMACY TO DOSE CODE STATUS: - Full CodeThis note was written using transcription software, and may contain errors (despite best efforts to prevent this); and so, please do not hesitate to contact me for clarification if required. Serita Kyle, MDDepartment of MedicineBridgeport Hospital9:17 PM 03/09/2023

## 2023-03-11 ENCOUNTER — Encounter: Admit: 2023-03-11 | Payer: PRIVATE HEALTH INSURANCE | Attending: Gastroenterology | Primary: Internal Medicine

## 2023-03-11 ENCOUNTER — Inpatient Hospital Stay: Admit: 2023-03-11 | Payer: MEDICARE

## 2023-03-11 DIAGNOSIS — A4151 Sepsis due to Escherichia coli [E. coli]: Secondary | ICD-10-CM

## 2023-03-11 LAB — COMPREHENSIVE METABOLIC PANEL
BKR A/G RATIO: 0.7 — ABNORMAL LOW (ref 1.0–2.2)
BKR ALANINE AMINOTRANSFERASE (ALT): 110 U/L — ABNORMAL HIGH (ref 10–35)
BKR ALBUMIN: 2.9 g/dL — ABNORMAL LOW (ref 3.6–4.9)
BKR ALKALINE PHOSPHATASE: 190 U/L — ABNORMAL HIGH (ref 9–122)
BKR ANION GAP: 10 (ref 7–17)
BKR ASPARTATE AMINOTRANSFERASE (AST): 145 U/L — ABNORMAL HIGH (ref 10–35)
BKR AST/ALT RATIO: 1.3
BKR BILIRUBIN TOTAL: 0.4 mg/dL (ref <=1.2)
BKR BLOOD UREA NITROGEN: 19 mg/dL (ref 8–23)
BKR BUN / CREAT RATIO: 25.3 — ABNORMAL HIGH (ref 8.0–23.0)
BKR CALCIUM: 10 mg/dL (ref 8.8–10.2)
BKR CHLORIDE: 113 mmol/L — ABNORMAL HIGH (ref 98–107)
BKR CO2: 16 mmol/L — ABNORMAL LOW (ref 20–30)
BKR CREATININE: 0.75 mg/dL (ref 0.40–1.30)
BKR EGFR, CREATININE (CKD-EPI 2021): >60 mL/min/{1.73_m2} (ref >=60)
BKR GLOBULIN: 4.1 g/dL — ABNORMAL HIGH (ref 2.3–3.5)
BKR GLUCOSE: 97 mg/dL (ref 70–100)
BKR POTASSIUM: 3.6 mmol/L (ref 3.3–5.3)
BKR PROTEIN TOTAL: 7 g/dL (ref 6.6–8.7)
BKR SODIUM: 139 mmol/L (ref 136–144)

## 2023-03-11 LAB — CBC WITH AUTO DIFFERENTIAL
BKR WAM ABSOLUTE IMMATURE GRANULOCYTES.: 0.05 x 1000/ÂµL (ref 0.00–0.30)
BKR WAM ABSOLUTE LYMPHOCYTE COUNT.: 0.56 x 1000/ÂµL — ABNORMAL LOW (ref 0.60–3.70)
BKR WAM ABSOLUTE NRBC (2 DEC): 0 x 1000/ÂµL (ref 0.00–1.00)
BKR WAM ANALYZER ANC: 9.35 x 1000/ÂµL — ABNORMAL HIGH (ref 2.00–7.60)
BKR WAM BASOPHIL ABSOLUTE COUNT.: 0.02 x 1000/ÂµL (ref 0.00–1.00)
BKR WAM BASOPHILS: 0.2 % (ref 0.0–1.4)
BKR WAM EOSINOPHIL ABSOLUTE COUNT.: 0.02 x 1000/ÂµL (ref 0.00–1.00)
BKR WAM EOSINOPHILS: 0.2 % (ref 0.0–5.0)
BKR WAM HEMATOCRIT (2 DEC): 34.2 % — ABNORMAL LOW (ref 35.00–45.00)
BKR WAM HEMOGLOBIN: 11.1 g/dL — ABNORMAL LOW (ref 11.7–15.5)
BKR WAM IMMATURE GRANULOCYTES: 0.5 % (ref 0.0–1.0)
BKR WAM LYMPHOCYTES: 5.2 % — ABNORMAL LOW (ref 17.0–50.0)
BKR WAM MCH (PG): 29.1 pg (ref 27.0–33.0)
BKR WAM MCHC: 32.5 g/dL (ref 31.0–36.0)
BKR WAM MCV: 89.5 fL (ref 80.0–100.0)
BKR WAM MONOCYTE ABSOLUTE COUNT.: 0.8 x 1000/ÂµL (ref 0.00–1.00)
BKR WAM MONOCYTES: 7.4 % (ref 4.0–12.0)
BKR WAM MPV: 10.7 fL (ref 8.0–12.0)
BKR WAM NEUTROPHILS: 86.5 % — ABNORMAL HIGH (ref 39.0–72.0)
BKR WAM NUCLEATED RED BLOOD CELLS: 0 % (ref 0.0–1.0)
BKR WAM PLATELETS: 246 x1000/ÂµL (ref 150–420)
BKR WAM RDW-CV: 14.2 % (ref 11.0–15.0)
BKR WAM RED BLOOD CELL COUNT.: 3.82 M/ÂµL — ABNORMAL LOW (ref 4.00–6.00)
BKR WAM WHITE BLOOD CELL COUNT: 10.8 x1000/ÂµL (ref 4.0–11.0)

## 2023-03-11 LAB — PHOSPHORUS     (BH GH L LMW YH): BKR PHOSPHORUS: 0.8 mg/dL — CL (ref 2.2–4.5)

## 2023-03-11 LAB — MAGNESIUM: BKR MAGNESIUM: 2.2 mg/dL (ref 1.7–2.4)

## 2023-03-11 MED ORDER — POTASSIUM, SODIUM PHOSPHATES 280 MG-160 MG-250 MG ORAL POWDER PACKET
280-160-250 mg | Freq: Four times a day (QID) | ORAL | Status: DC
Start: 2023-03-11 — End: 2023-03-12
  Administered 2023-03-11 – 2023-03-12 (×3): 280-160-250 mg via ORAL

## 2023-03-11 MED ORDER — ERTAPENEM 1000 MG MBP
INTRAVENOUS | Status: DC
Start: 2023-03-11 — End: 2023-03-13
  Administered 2023-03-11 – 2023-03-12 (×2): 50.000 mL/h via INTRAVENOUS

## 2023-03-11 NOTE — Plan of Care
Problem: Adult Inpatient Plan of CareGoal: Plan of Care ReviewOutcome: Interventions implemented as appropriateGoal: Patient-Specific Goal (Individualized)Outcome: Interventions implemented as appropriateGoal: Absence of Hospital-Acquired Illness or InjuryOutcome: Interventions implemented as appropriateGoal: Optimal Comfort and WellbeingOutcome: Interventions implemented as appropriateGoal: Readiness for Transition of CareOutcome: Interventions implemented as appropriate Problem: Wound Healing ProgressionGoal: Optimal Wound HealingOutcome: Interventions implemented as appropriate Problem: Violence Risk or ActualGoal: Anger and Impulse ControlOutcome: Interventions implemented as appropriate Problem: Fall Injury RiskGoal: Absence of Fall and Fall-Related InjuryOutcome: Interventions implemented as appropriate Problem: Skin Injury Risk IncreasedGoal: Skin Health and IntegrityOutcome: Interventions implemented as appropriate Plan of Care Overview/ Patient Status    A/O x 3, RM Air ,Sating well ,No Complaint of pain ,Denies any SOB.VS Stable , Afebrile, No tele.Assit X 1, Continent x 2 ,voiding spontaneously.Pt was comfortably sleeping during last safety check,Bed in the lowest position , Call light in reach ,Personal Items in reach .Temp:  [97.5 F (36.4 C)-98.2 F (36.8 C)] 98 F (36.7 C)Pulse:  [58-75] 65Resp:  [12-23] 18BP: (90-134)/(59-95) 99/64SpO2:  [96 %-100 %] 100 %Device (Oxygen Therapy): room airTwo Rn's tried to draw pt Labs with no success,An IV consult in place for it to be drawn in the morning.

## 2023-03-11 NOTE — Plan of Care
Plan of Care Overview/ Patient Status    Problem: Adult Inpatient Plan of CareGoal: Plan of Care ReviewOutcome: Interventions implemented as appropriate 1000 - Administered medications.  Patient pleasant and in a good mood.  She is engaging.1155 - Critical  value reported to provider.  See flowsheet.1230 - Emptied ileostomy bag.  See flowsheets.  Patient states that she doesn't feel well.  Provider aware.  Patient is quiet.    1700 - Patient refused blood cultures.1730 - Patient being transferred to ET8.  Gave report to [place].

## 2023-03-11 NOTE — Plan of Care
Plan of Care Overview/ Patient Status    Transfer Note Nursing Danielle [name] is a 67 y.o. female transferred  with a chief complaint of AMS and hypotension  Assisted in to bed.  Vitals:  03/11/23 0601 03/11/23 1246 03/11/23 1326 03/11/23 1856 BP: 116/78 96/64 104/68 122/80 Pulse: 72 69 68 74 Resp:  18 20 18  Temp: 99.5 F (37.5 C) 97.8 F (36.6 C) 97.3 F (36.3 C) 98.6 F (37 C) TempSrc: Oral Oral Oral Oral SpO2: 99%  99% 100% Weight:     Height:      I have reviewed the patient's current medication orders..I have reviewed patient valuables @PTBELONGINGS @ Pt arrived on bed, A&Ox2, confused, VSS on RA. Skin is intact, BLE edema +2, Ileostomy and abd dressing covering NGT tube removal site. Pt Denied any pain or discomfort, laying in bed comfortable resting. [name], RN

## 2023-03-11 NOTE — Progress Notes
[facility] Gastroenterology Progress Note 03/11/2023 Consultation requested by: Murvin Natal, MDSubjective:  Pt c/o mild nausea this morning. Otherwise no abdominal pain. Current Medications Current Facility-Administered Medications Medication Dose Route Frequency Provider Last Rate Last Admin  apixaban (ELIQUIS) tablet 5 mg  5 mg Oral Q12H Thakker, Nisarg, MD   5 mg at 03/10/23 2047  ceFEPIme (MAXIPIME) 2 g in sodium chloride 0.9% PF 20 mL (100 mg/mL)  2 g Intravenous Q8H Thakker, Nisarg, MD   2 g at 03/11/23 0519  chlorhexidine gluconate (HIBICLENS/BETASEPT) 4 % topical liquid   Topical (Top) Daily Mathews Robinsons, MD   Given at 03/10/23 0000  ethyl alcohol 62 % nasal swab 1 Application  1 Application Nasal Q12H Mathews Robinsons, MD   1 Application at 03/10/23 2102  [Held by provider] gabapentin (NEURONTIN) capsule 400 mg  400 mg Oral BID WC Thakker, Nisarg, MD      guar gum (BENEFIBER) packet 1 packet  1 packet Oral TID WC Renda Rolls, MD   1 packet at 03/10/23 1720  [Held by provider] hydroxychloroquine (PLAQUENIL) tablet 200 mg  200 mg Oral Daily Thakker, Nisarg, MD      pantoprazole (PROTONIX) EC tablet 40 mg  40 mg Oral Daily Parambath, Milas Kocher, MD      [Held by provider] predniSONE (DELTASONE) tablet 2 mg  2 mg Oral Daily Parambath, Milas Kocher, MD      rosuvastatin (CRESTOR) tablet 10 mg  10 mg Oral Nightly Thakker, Rhea Belton, MD   10 mg at 03/10/23 2046  sodium chloride 0.9 % flush 3 mL  3 mL IV Push Q8H Thakker, Nisarg, MD   3 mL at 03/11/23 0625 acetaminophen, sodium chlorideObjective: Vitals:Last 24 hours: Temp:  [97.5 ?F (36.4 ?C)-99.5 ?F (37.5 ?C)] 99.5 ?F (37.5 ?C)Pulse:  [60-75] 72Resp:  [13-23] 18BP: (90-134)/(60-95) 116/78SpO2:  [96 %-100 %] 99 %Weight: 63.6 kg Physical Exam:Physical ExamHENT:    Head: Normocephalic and atraumatic. Pulmonary:    Effort: Pulmonary effort is normal. Abdominal:    General: There is no distension.    Palpations: Abdomen is soft.    Tenderness: There is no abdominal tenderness. There is no guarding or rebound.    Comments: + ileostomy; ileostomy bag is empty+ Prior PEG site c/d/i Skin:   General: Skin is warm and dry. Neurological:    General: No focal deficit present.   Labs: Recent Labs Lab 03/18/241248 03/18/242309 03/19/240555 03/19/241321 WBC 16.1* 9.7 10.6  --  HGB 11.1* 9.8* 10.0*  --  HCT 33.70* 30.90* 31.10*  --  PLT 215 192 176  --  NEUTROPHILS 87.5*  --  94.0*  --  NA  --  140 138 137 K  --  4.1 4.0 3.6 CL  --  113* 113* 110* CO2  --  17* 18* 18* BUN  --  23 22 21  CREATININE  --  0.97 0.93 0.81 ANIONGAP  --  10 7 9   Recent Labs Lab 03/18/241236 03/18/241236 03/18/242309 03/19/240555 03/19/241321 ALT 8*  --   --  32  --  AST 32  --   --  75*  --  ALKPHOS 84  --   --  60  --  BILITOT 0.3  --   --  0.6  --  PROT 8.4  --   --  6.8  --  ALBUMIN 3.4*  --   --  2.9*  --  CALCIUM 10.1  --  8.8 9.1 9.2 MG 1.8  --  1.7 1.8  --  PHOS  --    < >  2.3 2.6  --  LABPROT 13.1*  --   --   --   --  INR 1.24*  --   --   --   --   < > = values in this interval not displayed.  Recent Labs Lab 03/18/241236 03/18/242243 03/18/242309 03/18/242337 03/19/240516 03/19/240555 03/19/241321 GLU 114* 101* 98 111* 102* 113* 110* Recent Labs Lab 03/18/242309 HGBA1C 5.5 Recent Labs Lab 03/18/242309 TSH 0.527 No results for input(s): CHOL, HDL, LDL, TRIG in the last 168 hours.Invalid input(s): CHOLHDLRAT, LDLHDLRATUARecent Labs Lab 03/18/241322 SPECGRAV 1.032* PHUR 5.5 LEUKOCYTESUR 4+* NITRITE Positive* PROTEINUA 2+* GLUCOSEU Negative KETONESU Negative BILIRUBINUR Negative UROBILINOGEN <2.0 Microbiology: Blood CulturesRecent Labs Lab 03/18/241236 LABBLOO Abnormal Stain* - Gram Negative Rods, Lactose Fermenting* - No Growth to Date  Urine CulturesRecent Labs Lab 03/18/241322 LABURIN >=100,000 CFU/mL Escherichia coli* Respiratory Cultures No results for input(s): LOWERRESPIRA in the last 168 hours. Diagnostics: CXR portableResult Date: 03/09/2023  No active cardiopulmonary abnormalities.  No change. [facility] Communications Center: Routine. Reported and signed by: Amie Critchley, MD  Impression/Recommendations: Danielle [name] [name] is a 67 y.o. 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  Currently off pressors.  Clinically improved. On admission patient reported 1 month of high volume output from ileostomy.  Consulted for recommendations. #Ileostomy Output:  Patient reporting need for ileostomy bag changes about 3 times per day.  Patient noted this frequency has remained consistent since original surgery in 2022.  Stool infectious workup currently negative.  Unclear if patient is truly having high output from ileostomy.  Up to 1500 mL of fluid output per 24 hour is normal.  Given stool infectious workup is negative, would continue to monitor output. Ddx also includes short gut syndrome. 1350cc out from ostomy over last 24 hours.  - fiber supplementation with guar gum- daily PPI for heartburn- document output from ostomy - consider loperamide if patient fails to response to dietary supplements.  - GI to sign offThe above recommendations are not finalized until discussed with and co-signed by the attending provider, Dr. Perrin Maltese. Thank you for allowing [place] to participate in the continued care of [name]. We will continue to follow along with you. Signed:[name], MDGastroenterology FellowMobile heartbeat: [phone] - [phone]: [phone]: Portions of this document may have been transcribed using speech recognition software. Despite attempts at proofreading this document may contain transcription errors. Please do not hesitate to contact this writer to request clarification.-- Attending StatementI have seen/evaluated the patient and agree with the history, physical exam and plan as documented by the GI fellow. Michaelyn Barter, MD AGAF  [id number]Fre[address], [place] [id number] [email or web address]

## 2023-03-12 ENCOUNTER — Telehealth: Admit: 2023-03-12 | Payer: PRIVATE HEALTH INSURANCE | Attending: Gastroenterology | Primary: Nephrology

## 2023-03-12 ENCOUNTER — Ambulatory Visit: Admit: 2023-03-12 | Payer: MEDICARE | Primary: Internal Medicine

## 2023-03-12 ENCOUNTER — Ambulatory Visit: Admit: 2023-03-12 | Payer: MEDICARE | Attending: Family | Primary: Internal Medicine

## 2023-03-12 ENCOUNTER — Inpatient Hospital Stay: Admit: 2023-03-12 | Payer: MEDICARE

## 2023-03-12 DIAGNOSIS — Z1612 Extended spectrum beta lactamase (ESBL) resistance: Secondary | ICD-10-CM

## 2023-03-12 DIAGNOSIS — N390 Urinary tract infection, site not specified: Secondary | ICD-10-CM

## 2023-03-12 DIAGNOSIS — Z79899 Other long term (current) drug therapy: Secondary | ICD-10-CM

## 2023-03-12 DIAGNOSIS — K219 Gastro-esophageal reflux disease without esophagitis: Secondary | ICD-10-CM

## 2023-03-12 DIAGNOSIS — E872 Acidosis, unspecified: Secondary | ICD-10-CM

## 2023-03-12 DIAGNOSIS — Z1152 Encounter for screening for COVID-19: Secondary | ICD-10-CM

## 2023-03-12 DIAGNOSIS — M069 Rheumatoid arthritis, unspecified: Secondary | ICD-10-CM

## 2023-03-12 DIAGNOSIS — N170 Acute kidney failure with tubular necrosis: Secondary | ICD-10-CM

## 2023-03-12 DIAGNOSIS — R6521 Severe sepsis with septic shock: Secondary | ICD-10-CM

## 2023-03-12 DIAGNOSIS — Z932 Ileostomy status: Secondary | ICD-10-CM

## 2023-03-12 DIAGNOSIS — Z9049 Acquired absence of other specified parts of digestive tract: Secondary | ICD-10-CM

## 2023-03-12 DIAGNOSIS — G8220 Paraplegia, unspecified: Secondary | ICD-10-CM

## 2023-03-12 DIAGNOSIS — A4151 Sepsis due to Escherichia coli [E. coli]: Secondary | ICD-10-CM

## 2023-03-12 DIAGNOSIS — Z86718 Personal history of other venous thrombosis and embolism: Secondary | ICD-10-CM

## 2023-03-12 DIAGNOSIS — Z7901 Long term (current) use of anticoagulants: Secondary | ICD-10-CM

## 2023-03-12 DIAGNOSIS — G9341 Metabolic encephalopathy: Secondary | ICD-10-CM

## 2023-03-12 LAB — CBC WITH AUTO DIFFERENTIAL
BKR WAM ABSOLUTE IMMATURE GRANULOCYTES.: 0.04 x 1000/??L (ref 0.00–0.30)
BKR WAM ABSOLUTE LYMPHOCYTE COUNT.: 0.9 x 1000/??L (ref 0.60–3.70)
BKR WAM ABSOLUTE NRBC (2 DEC): 0 x 1000/??L (ref 0.00–1.00)
BKR WAM ANALYZER ANC: 6.66 x 1000/??L (ref 2.00–7.60)
BKR WAM BASOPHIL ABSOLUTE COUNT.: 0.03 x 1000/??L (ref 0.00–1.00)
BKR WAM BASOPHILS: 0.3 % (ref 0.0–1.4)
BKR WAM EOSINOPHIL ABSOLUTE COUNT.: 0.05 x 1000/??L (ref 0.00–1.00)
BKR WAM EOSINOPHILS: 0.6 % (ref 0.0–5.0)
BKR WAM HEMATOCRIT (2 DEC): 32.6 % — ABNORMAL LOW (ref 35.00–45.00)
BKR WAM HEMOGLOBIN: 11 g/dL — ABNORMAL LOW (ref 11.7–15.5)
BKR WAM IMMATURE GRANULOCYTES: 0.5 % (ref 0.0–1.0)
BKR WAM LYMPHOCYTES: 10.3 % — ABNORMAL LOW (ref 17.0–50.0)
BKR WAM MCH (PG): 29.5 pg (ref 27.0–33.0)
BKR WAM MCHC: 33.7 g/dL (ref 31.0–36.0)
BKR WAM MCV: 87.4 fL (ref 80.0–100.0)
BKR WAM MONOCYTE ABSOLUTE COUNT.: 1.05 x 1000/??L — ABNORMAL HIGH (ref 0.00–1.00)
BKR WAM MONOCYTES: 12 % (ref 4.0–12.0)
BKR WAM MPV: 10.7 fL (ref 8.0–12.0)
BKR WAM NEUTROPHILS: 76.3 % — ABNORMAL HIGH (ref 39.0–72.0)
BKR WAM NUCLEATED RED BLOOD CELLS: 0 % (ref 0.0–1.0)
BKR WAM PLATELETS: 254 x1000/ÂµL (ref 150–420)
BKR WAM RDW-CV: 14.3 % (ref 11.0–15.0)
BKR WAM RED BLOOD CELL COUNT.: 3.73 M/??L — ABNORMAL LOW (ref 4.00–6.00)
BKR WAM WHITE BLOOD CELL COUNT: 8.7 x1000/??L (ref 4.0–11.0)

## 2023-03-12 LAB — COMPREHENSIVE METABOLIC PANEL
BKR A/G RATIO: 0.7 — ABNORMAL LOW (ref 1.0–2.2)
BKR ALANINE AMINOTRANSFERASE (ALT): 68 U/L — ABNORMAL HIGH (ref 10–35)
BKR ALBUMIN: 2.9 g/dL — ABNORMAL LOW (ref 3.6–4.9)
BKR ALKALINE PHOSPHATASE: 170 U/L — ABNORMAL HIGH (ref 9–122)
BKR ANION GAP: 7 (ref 7–17)
BKR ASPARTATE AMINOTRANSFERASE (AST): 53 U/L — ABNORMAL HIGH (ref 10–35)
BKR AST/ALT RATIO: 0.8
BKR BILIRUBIN TOTAL: 0.3 mg/dL (ref <=1.2)
BKR BLOOD UREA NITROGEN: 12 mg/dL (ref 8–23)
BKR BUN / CREAT RATIO: 19.4 (ref 8.0–23.0)
BKR CALCIUM: 10.2 mg/dL (ref 8.8–10.2)
BKR CHLORIDE: 111 mmol/L — ABNORMAL HIGH (ref 98–107)
BKR CO2: 22 mmol/L (ref 20–30)
BKR CREATININE: 0.62 mg/dL (ref 0.40–1.30)
BKR EGFR, CREATININE (CKD-EPI 2021): >60 mL/min/{1.73_m2} (ref >=60)
BKR GLOBULIN: 4.2 g/dL — ABNORMAL HIGH (ref 2.3–3.5)
BKR GLUCOSE: 101 mg/dL — ABNORMAL HIGH (ref 70–100)
BKR POTASSIUM: 4.1 mmol/L (ref 3.3–5.3)
BKR PROTEIN TOTAL: 7.1 g/dL (ref 6.6–8.7)
BKR SODIUM: 140 mmol/L (ref 136–144)

## 2023-03-12 LAB — BLOOD CULTURE   (BH GH L LMW YH): BKR BLOOD CULTURE: ABNORMAL — CR

## 2023-03-12 LAB — MAGNESIUM: BKR MAGNESIUM: 2 mg/dL (ref 1.7–2.4)

## 2023-03-12 LAB — PHOSPHORUS     (BH GH L LMW YH): BKR PHOSPHORUS: 1.2 mg/dL — CL (ref 2.2–4.5)

## 2023-03-12 LAB — URINE CULTURE: BKR URINE CULTURE, ROUTINE: >=100000 — AB

## 2023-03-12 MED ORDER — POTASSIUM, SODIUM PHOSPHATES 280 MG-160 MG-250 MG ORAL POWDER PACKET
280-160-250 mg | Freq: Four times a day (QID) | ORAL | Status: DC
Start: 2023-03-12 — End: 2023-03-12

## 2023-03-12 MED ORDER — SODIUM CHLORIDE 0.9 % (FLUSH) INJECTION SYRINGE
0.9 % | INTRAVENOUS | Status: AC | PRN
Start: 2023-03-12 — End: ?

## 2023-03-12 MED ORDER — ERTAPENEM 1000 MG MBP
INTRAVENOUS | Status: AC
Start: 2023-03-12 — End: ?

## 2023-03-12 MED ORDER — ACETAMINOPHEN 325 MG TABLET
325 mg | ORAL_TABLET | Freq: Three times a day (TID) | ORAL | 1 refills | Status: AC | PRN
Start: 2023-03-12 — End: ?

## 2023-03-12 MED ORDER — CHLORHEXIDINE GLUCONATE 4 % TOPICAL LIQUID
4 % | Freq: Every day | TOPICAL | Status: AC
Start: 2023-03-12 — End: ?

## 2023-03-12 MED ORDER — POTASSIUM, SODIUM PHOSPHATES 280 MG-160 MG-250 MG ORAL POWDER PACKET
280-160-250 mg | Freq: Every day | ORAL | Status: AC
Start: 2023-03-12 — End: ?

## 2023-03-12 MED ORDER — POTASSIUM, SODIUM PHOSPHATES 280 MG-160 MG-250 MG ORAL POWDER PACKET
280-160-250 mg | ORAL | Status: CP
Start: 2023-03-12 — End: ?
  Administered 2023-03-12 (×3): 280-160-250 mg via ORAL

## 2023-03-12 MED ORDER — GUAR GUM ORAL PACKET
PACK | Freq: Three times a day (TID) | ORAL | 13 refills | Status: AC
Start: 2023-03-12 — End: ?

## 2023-03-12 NOTE — Progress Notes
[facility] Hospital[facility] Progress NoteAttending Provider: Murvin Natal, MD PCP: Kara Mead, Vernona Rieger CHospital Day: 2Subjective: Interval history: I have seen and examined the patient. No acute events overnight. Discussed with nursing staff.Patient reports feeling unwell Refusing blood work Patient will need possible repeat blood work and blood cultures.  Antibiotics adjusted according to sensitivities.  Review of Allergies/Meds/Hx: Allergies have been reviewed. PMH and social history reviewed.Current Medications:apixaban, 5 mg, Q12Hchlorhexidine gluconate, , Dailyertapenem, 1,000 mg, Q24Hethyl alcohol 62 %, 1 Application, Q12H[Held by provider] gabapentin, 400 mg, BID WCguar gum, 1 packet, TID WC[Held by provider] hydroxychloroquine, 200 mg, Dailypantoprazole, 40 mg, Dailypotassium and sodium phosphates (ELEMENTAL phosphorus 250 mg/packet), 1 packet, 4x Daily[Held by provider] predniSONE, 2 mg, Dailyrosuvastatin, 10 mg, Nightlysodium chloride, 3 mL, Q8HPRN Medications:acetaminophen, sodium chlorideObjective: Vitals:Temp:  [97.3 ?F (36.3 ?C)-99.5 ?F (37.5 ?C)] 97.9 ?F (36.6 ?C)Pulse:  [65-74] 73Resp:  [18-20] 18BP: (96-149)/(64-80) 149/80SpO2:  [99 %-100 %] 99 %SpO2:  [99 %-100 %] 99 % (03/20 2043) Gross Totals (Last 24 hours) at 03/11/2023 2346Last data filed at 03/11/2023 2200Intake 45 ml Output 875 ml Net -830 ml Physical Examination:Cardiac: RRR, no murmur/rubs/gallopsPulmonary: CTAB, no wheeze, rales or rhonchiAbdomen: normal bowel sounds, soft, nontender, nondistendedExtremities: No edema, no cyanosis, pulses full and equalNeurologic: AAO to time place and person, no gross focal deficitsSkin: no rashesLabs:Recent Labs Lab 03/18/242309 03/18/242337 03/19/240555 03/19/241321 03/20/241106 NA 140  --  138 137 139 K 4.1  --  4.0 3.6 3.6 CL 113*  --  113* 110* 113* CO2 17*  --  18* 18* 16* BUN 23  --  22 21 19  CREATININE 0.97  --  0.93 0.81 0.75 GLU 98   < > 113* 110* 97 CALCIUM 8.8  --  9.1 9.2 10.0 MG 1.7  --  1.8  --  2.2  < > = values in this interval not displayed. Recent Labs Lab 03/18/242309 03/19/240555 03/20/241106 WBC 9.7 10.6 10.8 HGB 9.8* 10.0* 11.1* HCT 30.90* 31.10* 34.20* PLT 192 176 246 Recent Labs Lab 03/18/241236 INR 1.24* Lab Results Component Value Date  TROPONINI 0.00 12/13/2018 Diagnostics:ED Diagnostic Ultrasound EchoResult Date: 3/18/2024Disclaimer:Please look in ED Provider Note for the Impression.CXR portableResult Date: 3/18/2024CLINICAL INFORMATION: ams TECHNIQUE: XR CHEST PA OR AP COMPARISON: 11/03/2022. FINDINGS: Heart/Mediastinum:  The cardiac silhouette is unchanged.  The mediastinal contours are relatively stable. Lungs:  No acute infiltrates.    No pleural effusions. Osseous Structures:  No acute abnormalities. Additional Comments: None.   No active cardiopulmonary abnormalities.  No change. [facility] Communications Center: Routine. Reported and signed by: Amie Critchley, MD Assessment & Plan: 67 year old female with history of rheumatoid arthritis, DVT on anticoagulation, perforated diverticulitis status post exploratory laparotomy and right hemicolectomy with ileostomy in 2022 who presented to the hospital with changes in mental status and found to be hypotensive. Initially in the emergency room she was noted to be hypotensive with response to fluid resuscitation, subsequently patient became hypotensive again ICU was called given signs of hypoperfusion as needed with rising lactic acid, hypotension.  For that reason pressors were started, patient was aggressively fluid resuscitated, started antibiotic therapy.  She was also placed on stress dose steroids given her chronic use of prednisone 2 mg and Plaquenil for her RA. Septic Shock, resolvedPatient was found to have ESBL bacteremia and UTI.  Patient will need repeat blood cultures and lab work.  Patient will need at least into 14 days of IV antibiotics.  Buttocks adjusted according to sensitivity resultsESBL UTI with ESBL bacteremia plan as aboveMetabolic acidosis secondary to lactic  acidosis currently improved with volume expansion Encephalopathy, much improved but still fluctuating, monitor mental status, awaiting geriatrics input.  AKILactic acidosis, improves# DVT prophylaxis:  EliquisDisposition hopefully DC in a.m.I have reviewed the consultants recommendations and plan of care. Care was discussed with the nursing and case management teams. The patient was updated regarding the illness, plan of care and disposition plan. All orders were entered electronically.Signed:Sura Canul, MD Please page using smart web (Hours 8 AM to 4PM)03/11/2023

## 2023-03-12 NOTE — Discharge Instructions
As a member of the Hospitalist group, I am honored to care for you at [facility]. Our role as Hospitalists is to serve as your primary doctors during your stay. Please don't hesitate to reach out to [place] at the number provided if you have any questions about your care. If you receive a survey, we welcome you to let us know about your stay.Sincerely,  Murvin Natal, MD   [phone])

## 2023-03-12 NOTE — Plan of Care
Plan of Care Overview/ Patient Status    1900-2300Patient is alert and oriented x1Contact isolation continuesContinues on RA, resp even and unlabored, no SOBCardiac diet in place, medications taken whole, tolerated wellIncontinent of bladder, pure wick in placeIleostomy in place, CDIPatient is Ax2 in bedSkin intact Turned and repositioned q2H. Heels offloadedNo c/o pain/ discomfortBed alarm on and audibleCall bell placed within reachHourly rounding continuesSafety checks maintainedSee flow sheets for more detailsProblem: Adult Inpatient Plan of CareGoal: Plan of Care ReviewOutcome: Interventions implemented as appropriate

## 2023-03-12 NOTE — Care Coordination-Inpatient
Stretcher transport booked by [employer] - BLS trip for [name] will be scheduled for Thu Mar 12 2023 at 16:00 (4pm)Sister Jacquenette Shone [phone]  updated.IM notice signed and faxed to Epic.Zaydn Gutridge Medical illustrator

## 2023-03-12 NOTE — Discharge Summary
[facility] Hospitalist Discharge SummaryPatient Data:  Patient Name: Danielle [name] Age: 67 y.o. DOB: Oct 25, 1956	 MRN: [id number]	 Admit date: 3/18/2024Discharge date:  03/12/2023 Primary Care Physician: Kara Mead, Vernona Rieger CPrincipal Diagnosis: Sepsis associated hypotension (HC Code)Septic shock resolvedESBL UTI ESBL bacteremia Metabolic acidosis secondary to lactic acidosis Altered mental status Acute kidney injury Other Diagnosis: Active Problems:  * No active hospital problems. *Discharged Condition: fairDisposition: Skilled Holiday representative for Long Term Care. 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 Initially in the emergency room she was noted to be hypotensive with response to fluid resuscitation, subsequently patient became hypotensive again ICU was called given signs of hypoperfusion as needed with rising lactic acid, hypotension.  For that reason pressors were started, patient was aggressively fluid resuscitated, started antibiotic therapy.  She was also placed on stress dose steroids given her chronic use of prednisone 2 mg and Plaquenil for her RA. Septic Shock, resolvedPatient was found to have ESBL bacteremia and UTI.  Repeat blood cultures and blood work drawn,Patient will need at least into 14 days of IV antibiotics.  Buttocks adjusted according to sensitivity results ESBL UTI with ESBL bacteremia plan as above Metabolic acidosis secondary to lactic acidosis currently improved with volume expansion  Severe hypophosphatemia repletion ongoing.  Patient will need repeat blood work at rehab.  Encephalopathy, much improved but still fluctuating, monitor mental status, awaiting geriatrics input.   AKI resolved Lactic acidosis, improvedDiscussed ongoing issues with patient's sister and she is in agreement with discharge plan.  Discharge Physical Exam:Blood pressure 123/79, pulse 73, temperature 99.6 ?F (37.6 ?C), temperature source Axillary, resp. rate 18, height 5' 10 (1.778 m), weight 63.6 kg, SpO2 100 %.Physical Exam was performed on the day of dischargeCardiac: RRR, no murmur/rubs/gallopsPulmonary: CTAB, no wheeze, rales or rhonchiAbdomen: normal bowel sounds, soft, nontender, nondistendedExtremities: No edema, no cyanosis, pulses full and equalNeurologic: AAO to time place and person, no gross focal deficitsSkin: no rashes Labs:Recent Labs Lab 03/19/240555 03/19/241321 03/20/241106 03/21/241012 NA 138 137 139 140 K 4.0 3.6 3.6 4.1 CL 113* 110* 113* 111* CO2 18* 18* 16* 22 BUN 22 21 19 12  CREATININE 0.93 0.81 0.75 0.62 GLU 113* 110* 97 101* CALCIUM 9.1 9.2 10.0 10.2 MG 1.8  --  2.2 2.0 Recent Labs Lab 03/19/240555 03/20/241106 03/21/241012 WBC 10.6 10.8 8.7 HGB 10.0* 11.1* 11.0* HCT 31.10* 34.20* 32.60* PLT 176 246 254 Diagnostics:ED Diagnostic Ultrasound EchoResult Date: 3/18/2024Disclaimer:Please look in ED Provider Note for the Impression.CXR portableResult Date: 3/18/2024CLINICAL INFORMATION: ams TECHNIQUE: XR CHEST PA OR AP COMPARISON: 11/03/2022. FINDINGS: Heart/Mediastinum:  The cardiac silhouette is unchanged.  The mediastinal contours are relatively stable. Lungs:  No acute infiltrates.    No pleural effusions. Osseous Structures:  No acute abnormalities. Additional Comments: None.   No active cardiopulmonary abnormalities.  No change. [facility] Communications Center: Routine. Reported and signed by: Amie Critchley, MD Pending Labs and Tests: Pending Lab Results   Order Current Status  Blood culture Preliminary result  Blood culture #1 Preliminary result  Follow-up Information:Brenes Dorso, Assunta Gambles, [id number]Tor[address] [place] [phone] [facility] [address] [phone] Discharge Medications: Current Discharge Medication List  START taking these medications  Details acetaminophen (TYLENOL) 325 mg tablet Take 2 tablets (650 mg total) by mouth every 8 (eight) hours as needed for pain.Qty: 30 tablet, Refills: 0Start date: 03/12/2023  chlorhexidine gluconate (HIBICLENS/BETASEPT) 4 % external liquid Apply topically daily.Qty: 120 mLStart date: 03/12/2023, End date: 03/26/2023  ertapenem Pincus Sanes) 1,000 mg in sodium chloride 0.9% 50 mL (mini-bag plus) Inject 1,000 mg into the vein every 24 hours.Start date: 03/13/2023, End date: 03/25/2023  guar gum (BENEFIBER) packet Take 1 packet by mouth 3 (three) times daily with meals.Qty: 75 packet, Refills: 12Start date: 03/12/2023  potassium and sodium phosphates, ELEMENTAL phosphorus 250 mg/packet, (PHOS-NAK) oral powder Take 1 packet by mouth daily.Start date: 03/12/2023  sodium chloride 0.9 % flush 3 mLs by IV Push route PRN for Line Care for line care.Qty: 5 mLStart date: 03/12/2023   CONTINUE these medications which have NOT CHANGED  Details apixaban (ELIQUIS) 5 mg tablet 1 tablet (5 mg total) by Per G Tube route every 12 (twelve) hours.  diclofenac (VOLTAREN) 1 % gel Apply 4 g topically 4 (four) times daily.Qty: 100 g, Refills: 2  famotidine (PEPCID) 20 mg tablet Take 1 tablet (20 mg total) by mouth daily.  ferrous gluconate (FERGON) 324 mg (38 mg iron) tablet Take 1 tablet (324 mg total) by mouth every other day.  gabapentin (NEURONTIN) 400 mg capsule Take 1 capsule (400 mg total) by mouth 2 (two) times daily with breakfast and dinner.  gabapentin (NEURONTIN) 600 mg tablet Take 1 tablet (600 mg total) by mouth nightly.  hydrOXYchloroQUINE (PLAQUENIL) 200 mg tablet Take 1 tablet (200 mg total) by mouth daily.Qty: 14 tablet, Refills: 0  insulin lispro (ADMELOG, HUMALOG) 100 unit/mL vial Inject under the skin 3 (three) times daily before meals. Use per sliding scale.  lidocaine 4 % topical patch Place 1 patch onto the skin every 24 hours. Remove & Discard patch within 12 hours or as directed by MDQty: 30 patch  melatonin 3 mg tablet Take 1 tablet (3 mg total) by mouth nightly.Refills: 0  nystatin (MYCOSTATIN) 100,000 unit/gram powder Apply 1 Application  topically 2 (two) times daily.Qty: 15 g  ondansetron (ZOFRAN-ODT) 4 mg disintegrating tablet Place 1 tablet (4 mg total) onto the tongue every 8 (eight) hours as needed for nausea.  rosuvastatin (CRESTOR) 10 mg tablet 1 tablet (10 mg total) by Per G Tube route nightly.  traMADoL (ULTRAM) 50 mg tablet Take 1 tablet (50 mg total) by mouth every 6 (six) hours as needed for pain.   STOP taking these medications   predniSONE (DELTASONE) 1 mg tablet    Time spent arranging discharge was . More than 50% of the time was spent for counseling and coordination of discharge.Electronically Signed:Tyleek Smick, MD  03/12/2023 11:00 AM

## 2023-03-12 NOTE — Progress Notes
Telephone message sent to front desk

## 2023-03-12 NOTE — Plan of Care
Problem: Adult Inpatient Plan of CareGoal: Plan of Care ReviewOutcome: Interventions implemented as appropriateFlowsheets (Taken 03/12/2023 0433)Plan of Care Reviewed With: patientGoal: Patient-Specific Goal (Individualized)Outcome: Interventions implemented as appropriateFlowsheets (Taken 03/12/2023 0000)What Anxieties, Fears, Concerns or Questions Do You Have About Your Care?: none statedIndividualized Preferences and Care Needs: Contact Plus precautionPatient Centered Long Term Goal: safe dischargePatient Centered Daily Goal: per pocGoal: Absence of Hospital-Acquired Illness or InjuryOutcome: Interventions implemented as appropriateIntervention: Identify and Manage Fall RiskFlowsheets (Taken 03/12/2023 0000)Universal Fall Prevention: activity supervised keep room clutter free with open pathway to the bathroom assistive device/personal items within reach nonskid shoes/slippers when out of bed safety round/check completedIntervention: Prevent Skin InjuryFlowsheetsTaken 03/12/2023 0433Body Position: supine, head elevated weight shift assistance providedTaken 03/12/2023 0000Skin Protection: adhesive use limited tubing/devices free from skin contact incontinence pads utilizedIntervention: Prevent and Manage VTE (Venous Thromboembolism) RiskFlowsheets (Taken 03/12/2023 0433)VTE Prevention/Management: dorsiflexion/plantar flexion performedIntervention: Prevent InfectionFlowsheets (Taken 03/12/2023 0000)Infection Prevention: rest/sleep promoted single patient room providedGoal: Optimal Comfort and WellbeingOutcome: Interventions implemented as appropriateIntervention: Monitor Pain and Promote ComfortFlowsheets (Taken 03/12/2023 0433)Pain Management Interventions: care clustered pain management plan reviewed with patient/caregiver pillow support provided position adjusted quiet environment facilitated relaxation techniques promotedIntervention: Provide Person-Centered CareFlowsheets (Taken 03/12/2023 0000)Trust Relationship/Rapport: care explained choices providedGoal: Readiness for Transition of CareOutcome: Interventions implemented as appropriate Problem: Wound Healing ProgressionGoal: Optimal Wound HealingOutcome: Interventions implemented as appropriateIntervention: Promote Wound HealingFlowsheetsTaken 03/12/2023 0433Pressure Reduction Techniques: frequent weight shift encouraged weight shift assistance providedSleep/Rest Enhancement: awakenings minimized consistent schedule promoted relaxation techniques promoted regular sleep/rest pattern promotedActivity Management: activity adjusted per tolerance dorsiflexion/plantar flexion performedPain Management Interventions: care clustered pain management plan reviewed with patient/caregiver pillow support provided position adjusted quiet environment facilitated relaxation techniques promotedTaken 03/12/2023 0000Oral Nutrition Promotion: rest periods promotedPressure Reduction Devices: positioning supports utilized pressure-redistributing mattress utilized Problem: Violence Risk or ActualGoal: Anger and Impulse ControlOutcome: Interventions implemented as appropriateIntervention: Minimize Safety RiskFlowsheets (Taken 03/12/2023 0000)Sensory Stimulation Regulation: care clustered music/television provided for relaxationEnhanced Safety Measures: bed alarm setIntervention: Promote Self-ControlFlowsheetsTaken 03/12/2023 0433Supportive Measures: active listening utilizedTaken 03/12/2023 0000Environmental Support: rest periods encouraged Problem: Fall Injury RiskGoal: Absence of Fall and Fall-Related InjuryOutcome: Interventions implemented as appropriateIntervention: Identify and Manage ContributorsFlowsheets (Taken 03/12/2023 0000)Self-Care Promotion: independence encouraged BADL personal objects within reach BADL personal routines maintainedMedication Review/Management: medications reviewedIntervention: Promote Injury-Free EnvironmentFlowsheets (Taken 03/12/2023 0000)Universal Fall Prevention: activity supervised keep room clutter free with open pathway to the bathroom assistive device/personal items within reach nonskid shoes/slippers when out of bed safety round/check completed Problem: Skin Injury Risk IncreasedGoal: Skin Health and IntegrityOutcome: Interventions implemented as appropriateIntervention: Optimize Skin ProtectionFlowsheetsTaken 03/12/2023 0433Pressure Reduction Techniques: frequent weight shift encouraged weight shift assistance providedActivity Management: activity adjusted per tolerance dorsiflexion/plantar flexion performedTaken 03/12/2023 0000Pressure Reduction Devices: positioning supports utilized pressure-redistributing mattress utilizedSkin Protection: adhesive use limited tubing/devices free from skin contact incontinence pads utilizedHead of Bed (HOB) Positioning: HOB at 30-45 degreesIntervention: Promote and Optimize Oral IntakeFlowsheets (Taken 03/12/2023 0000)Oral Nutrition Promotion: rest periods promoted Problem: InfectionGoal: Absence of Infection Signs and SymptomsOutcome: Interventions implemented as appropriateIntervention: Prevent or Manage InfectionFlowsheets (Taken 03/12/2023 0000)Isolation Precautions: contact precautions maintainedInfection Management: aseptic technique maintained Plan of Care Overview/ Patient Status    Alert to self. No signs or symptoms or pain or distress. No respiratory distress. Turn and reposition. Ileostomy maintained. Call light within reach. Safety maintained.0630 attempted blood work unsuccessful, pt refused a re attempt, will endorse to oncoming nurse.

## 2023-03-12 NOTE — Plan of Care
Plan of Care Overview/ Patient Status    Case Management Plan  Flowsheet Row Most Recent Value Discharge Planning  Patient/Patient Representative goals/treatment preferences for discharge are:  Return to [facility] CM D/C Readiness  PASRR completed and approved N/A Authorization number obtained, if required N/A Is there a 3 day INPATIENT Qualifying stay for Medicare Patients? N/A Medicare IM- signed, dated, timed and scanned, if required Yes DME Authorized/Delivered N/A No needs identified/ follow up with PCP/MD N/A Post acute care services secured W10 complete Yes Pri Completed and Accepted  N/A Is the destination address correct on the W10 Yes Finalized Plan  Expected Discharge Date 03/12/23  Medically cleared for discharge.Patient is returning to [facility].Spoke with Conservator, [employer].  She is aware of discharge this evening.Transportation arranged by TC for 6:00 p.m. pickup.IM per TC.Lynita Lombard, RN, BS, BSNFloat Care CoordinatorBridgeport Weymouth End[facility]

## 2023-03-13 NOTE — Plan of Care
Problem: Adult Inpatient Plan of CareGoal: Plan of Care ReviewFlowsheets (Taken 03/12/2023 1901)Plan of Care Reviewed With: patient Plan of Care Overview/ Patient Status    Patient is alert but confused refused to be stuck for blood work. Patient has iv left upper arm glide intact. + blood return and blood work obtained and send to lab. Iv team called for blood cultures. Patient appetite is poor and po fluids good. Ileostomy draining loose green stools. Dressing to left peg site loose was removed but has xeroform gauze and packing in place was left alone and new foam dressing applied. Buttucks skin intact moisture barrier cream applied.  Foam dressing to right ankle for protection. Patient to be discharge to [facility]. Case management called conservotor is informed. 1855 Danielle [name] was discharged via Ambulance accompanied by Alone.  Acknowledged understanding of discharge instructionsand recommended follow up care as per the after visit summary.  Written discharge instructions provided. Denies any further questions. Vital signs    Vitals:  03/12/23 0543 03/12/23 0945 03/12/23 1345 03/12/23 1750 BP: 118/79 118/81 123/79 134/86 Pulse: 69 78 73 80 Resp: 16 18 18 18  Temp: 97.8 ?F (36.6 ?C) 97.8 ?F (36.6 ?C) 99.6 ?F (37.6 ?C) 98 ?F (36.7 ?C) TempSrc: Oral Oral Axillary Oral SpO2: 97% 99% 100% 99% Weight:     Height:     Patient confirmed all belongings returned. Belongings charted in last 7 days: @PTBELONGINGS @ Ambulance driver given report . Iv in left upper arm in place and patent. Ileostomy intact. Paper work given to Film/video editor. W-10 faxed. Patient brother took small wallet like with him and remaining  belongings left with patient. [address] [facility] was called after 4 attempts to give report unable to and send to voice mail. Small report given and told to call ET8 for any information and patient needs to eat because she didn't eat here refused.

## 2023-03-14 LAB — BLOOD CULTURE   (BH GH L LMW YH): BKR BLOOD CULTURE: NO GROWTH

## 2023-03-16 ENCOUNTER — Inpatient Hospital Stay: Admit: 2023-03-16 | Payer: PRIVATE HEALTH INSURANCE | Primary: Nephrology

## 2023-03-17 LAB — BLOOD CULTURE   (BH GH L LMW YH): BKR BLOOD CULTURE: NO GROWTH

## 2023-03-23 NOTE — ED Procedure Note
Danielle [name] is a 67 y.o. female patient.No diagnosis found.Past Medical History: Diagnosis Date  Acute embolism and thrombosis of unspecified femoral vein (HC Code)   Anemia   Cellulitis and abscess of right lower extremity   Dysphagia   Encephalopathy   GERD (gastroesophageal reflux disease)   Hypercalcemia   Paraplegia (HC Code)   Rheumatoid arthritis (HC Code)  Blood pressure (!) 89/57, pulse 69, temperature (!) 101.5 ?F (38.6 ?C), temperature source Rectal, resp. rate 18, SpO2 96 %.Insert peripheral IVPerformed by: Efraim Kaufmann, MBBSAuthorized by: Corliss Skains, MD  Time out documented by nurse: noTime out unable to be performed due to emergent nature of procedure:  NoTiming: the time out is initiated prior to the beginning of the procedure:  YesName of patient and medical record or DOB stated and matches ID band or previously confirmed medical record number.:  YesProceduralist states or confirms the procedure to be performed:  YesThe procedural consent is used to verify the procedure to be performed and it matches the patient identifiers:  YesSite of procedure(s) (with laterality or level) is topically marked per policy and visible after draping:  N/A(IF SITE NOT MARKED) Radiographic imaging is present or a laterality side/site band is present on patient and is accessible:  N/A and YesAllergies addressed: Yes. Medications addressed:  N/ABlood products addressed:  N/AImaging addressed:  N/AImplants addressed:  N/ASpecial equipment or other needs addressed:  YesProceduralist discusses particular challenges or special considerations with all team members:  YesIndication: maintain venous access  Preparation:  Alcohol wipeAnesthesia:  None: right cephalic.Catheter Size:  18 gaugeUltrasound Guided?: Yes  Number of attempts:  1Successful Placement: Yes  Flush Type:  SalineDressing Type:  Tegaderm IVPatient tolerance:  Patient tolerated the procedure well with no immediate complicationsCode status addressed: Yes.Efraim Kaufmann, MBBS3/18/2024 Efraim Kaufmann, MBBSResident03/18/24 15[address] Parents [place], MD04/01/24 [phone]

## 2023-03-25 ENCOUNTER — Telehealth: Admit: 2023-03-25 | Payer: PRIVATE HEALTH INSURANCE | Attending: Family | Primary: Internal Medicine

## 2023-03-25 NOTE — Telephone Encounter
Lvm for [facility] date/time/location

## 2023-03-26 ENCOUNTER — Inpatient Hospital Stay: Admit: 2023-03-26 | Discharge: 2023-03-26 | Payer: MEDICARE | Primary: Nephrology

## 2023-03-26 ENCOUNTER — Encounter: Admit: 2023-03-26 | Payer: PRIVATE HEALTH INSURANCE | Attending: Family | Primary: Internal Medicine

## 2023-03-26 ENCOUNTER — Ambulatory Visit: Admit: 2023-03-26 | Payer: MEDICARE | Attending: Family | Primary: Internal Medicine

## 2023-03-26 ENCOUNTER — Ambulatory Visit: Admit: 2023-03-26 | Payer: MEDICARE | Primary: Internal Medicine

## 2023-03-26 DIAGNOSIS — I82401 Acute embolism and thrombosis of unspecified deep veins of right lower extremity: Secondary | ICD-10-CM

## 2023-03-26 DIAGNOSIS — L03115 Cellulitis of right lower limb: Secondary | ICD-10-CM

## 2023-03-26 DIAGNOSIS — D508 Other iron deficiency anemias: Secondary | ICD-10-CM

## 2023-03-26 DIAGNOSIS — K219 Gastro-esophageal reflux disease without esophagitis: Secondary | ICD-10-CM

## 2023-03-26 DIAGNOSIS — I82419 Acute embolism and thrombosis of unspecified femoral vein: Secondary | ICD-10-CM

## 2023-03-26 DIAGNOSIS — I82413 Acute embolism and thrombosis of femoral vein, bilateral: Secondary | ICD-10-CM

## 2023-03-26 DIAGNOSIS — G8220 Paraplegia, unspecified: Secondary | ICD-10-CM

## 2023-03-26 DIAGNOSIS — D649 Anemia, unspecified: Secondary | ICD-10-CM

## 2023-03-26 DIAGNOSIS — G9340 Encephalopathy, unspecified: Secondary | ICD-10-CM

## 2023-03-26 DIAGNOSIS — M069 Rheumatoid arthritis, unspecified: Secondary | ICD-10-CM

## 2023-03-26 DIAGNOSIS — R1310 Dysphagia, unspecified: Secondary | ICD-10-CM

## 2023-03-26 LAB — IRON AND TIBC
BKR IRON SATURATION: 9 % — ABNORMAL LOW (ref 15–50)
BKR IRON: 26 ug/dL — ABNORMAL LOW (ref 37–145)
BKR TOTAL IRON BINDING CAPACITY: 277 ug/dL — ABNORMAL LOW (ref 250–450)

## 2023-03-26 LAB — CBC WITH AUTO DIFFERENTIAL
BKR SODIUM: 0.09 x 1000/??L (ref 0.00–1.00)
BKR WAM ABSOLUTE IMMATURE GRANULOCYTES.: 0 x 1000/ÂµL (ref 0.00–0.30)
BKR WAM ABSOLUTE LYMPHOCYTE COUNT.: 1.71 x 1000/??L (ref 0.60–3.70)
BKR WAM ABSOLUTE NRBC (2 DEC): 0 x 1000/??L (ref 0.00–1.00)
BKR WAM ANALYZER ANC: 2.34 x 1000/??L (ref 2.00–7.60)
BKR WAM BASOPHIL ABSOLUTE COUNT.: 0.09 x 1000/??L (ref 0.00–1.00)
BKR WAM BASOPHILS: 1.9 % — ABNORMAL HIGH (ref 0.0–1.4)
BKR WAM EOSINOPHIL ABSOLUTE COUNT.: 0.28 x 1000/??L (ref 0.00–1.00)
BKR WAM EOSINOPHILS: 5.8 % — ABNORMAL HIGH (ref 0.0–5.0)
BKR WAM HEMATOCRIT (2 DEC): 28.1 % — ABNORMAL LOW (ref 35.00–45.00)
BKR WAM HEMOGLOBIN: 8.5 g/dL — ABNORMAL LOW (ref 11.7–15.5)
BKR WAM IMMATURE GRANULOCYTES: 0 % (ref 0.0–1.0)
BKR WAM LYMPHOCYTES: 35.2 % (ref 17.0–50.0)
BKR WAM MCH (PG): 28.1 pg (ref 27.0–33.0)
BKR WAM MCHC: 30.2 g/dL — ABNORMAL LOW (ref 31.0–36.0)
BKR WAM MCV: 93 fL (ref 80.0–100.0)
BKR WAM MONOCYTE ABSOLUTE COUNT.: 0.44 x 1000/??L (ref 0.00–1.00)
BKR WAM MONOCYTES: 9.1 % (ref 4.0–12.0)
BKR WAM MPV: 10.1 fL (ref 8.0–12.0)
BKR WAM NEUTROPHILS: 48 % (ref 39.0–72.0)
BKR WAM NUCLEATED RED BLOOD CELLS: 0 % (ref 0.0–1.0)
BKR WAM PLATELETS: 413 x1000/??L (ref 150–420)
BKR WAM RDW-CV: 14.8 % (ref 11.0–15.0)
BKR WAM RED BLOOD CELL COUNT.: 3.02 M/??L — ABNORMAL LOW (ref 4.00–6.00)
BKR WAM WHITE BLOOD CELL COUNT: 4.9 x1000/??L (ref 4.0–11.0)

## 2023-03-26 LAB — SPECIAL COAGULATION MD INTERPRETATION (LAB ORDERABLE ONLY) (GH YH)

## 2023-03-26 LAB — PROTEIN C ACTIVITY: BKR PROTEIN C ACTIVITY: 104 % (ref 81–145)

## 2023-03-26 LAB — PROTIME AND INR
BKR INR: 1.04 pg (ref 0.86–1.12)
BKR PROTHROMBIN TIME: 11.5 seconds (ref 9.6–12.3)

## 2023-03-26 LAB — PROTEIN S ACTIVITY: BKR PROTEIN S ACTIVITY: 108 % (ref 62–150)

## 2023-03-26 LAB — FERRITIN: BKR FERRITIN: 40 ng/mL (ref 13–150)

## 2023-03-26 NOTE — Progress Notes
Pt seen and assessed by Joen Laura APRN.  Pt stated she had not had her midline dressing changed in 2 weeks since it was placed at her facility.  Per APRN, RN changed dressing; cleaned site with alcohol and chlorhexidine swab; RN was unable to remove the original wing guard attached to midline; Candise Bowens Cornell aware of this.  New dressing applied, dated and signed.  RN flushed and capped per protocol.  Jen Cornell to reach out to facility re: midline.

## 2023-03-27 LAB — CARDIOLIPIN ABS, IGA, IGG, IGM     (BH GH LMW Q YH)
BKR ACA IGA: 1.9 U/mL (ref <14.0)
BKR ACA IGG: 2.7 GPL U/mL (ref <10.0)
BKR ACA IGM: <2 MPL U/mL (ref 62–<10.0)

## 2023-03-27 LAB — BETA-2 GLYCOPROTEIN 1 ABS,IGG, IGA, IGM
BKR B2G IGA: 3.2 U/mL (ref <7.0)
BKR B2G IGG: 1.1 U/mL (ref <7.0)
BKR B2G IGM: <2.4 U/mL (ref <7.0)

## 2023-04-02 ENCOUNTER — Telehealth: Admit: 2023-04-02 | Payer: PRIVATE HEALTH INSURANCE | Attending: Family | Primary: Internal Medicine

## 2023-04-06 NOTE — Telephone Encounter
TC:  [facility] this evening (4/11).  Spoke to nurse, Misty Stanley at the facility who is familiar with patient.  They state her IV was removed.  Reviewed low Hgb and iron studies.  She should receive ferrous sulfate 325 mg (65 mg elemental iron) tablet daily.  Asked for repeat CBC, they will draw tomorrow or Monday (4/15).  No blood noted in her ileostomy per nurse, Misty Stanley.  Will likely need to schedule her for IV iron.  Nursing line number and fax number given to send results.

## 2023-04-07 ENCOUNTER — Inpatient Hospital Stay: Admit: 2023-04-07 | Discharge: 2023-04-07 | Payer: MEDICARE | Primary: Nephrology

## 2023-04-07 ENCOUNTER — Encounter: Admit: 2023-04-07 | Payer: PRIVATE HEALTH INSURANCE | Attending: Family | Primary: Internal Medicine

## 2023-04-07 ENCOUNTER — Telehealth: Admit: 2023-04-07 | Payer: PRIVATE HEALTH INSURANCE | Attending: Family | Primary: Internal Medicine

## 2023-04-07 DIAGNOSIS — R29898 Other symptoms and signs involving the musculoskeletal system: Secondary | ICD-10-CM

## 2023-04-07 DIAGNOSIS — D508 Other iron deficiency anemias: Secondary | ICD-10-CM

## 2023-04-07 NOTE — Telephone Encounter
Call placed to [facility] where pt is currently residingMessage left for Mindi Junker (contact in pt's chart) with request to call back ASAP to discuss pt coming to [facility] for 1-2 doses of IV infed. Clinic number provided, with request to speak with me directly. Electronically Signed by Benay Pillow, RN, April 07, 2023----- Message from Iva Boop, APRN sent at 04/07/2023  2:48 PM EDT -----Regarding: ? IV ironHi Curtis Sites, How does it work getting a patient IV iron from a SNF (skilled nursing facility)? I spoke with them last week and it was a little difficult. Let me know and I can order iron. Thanks so much, [employer]

## 2023-04-07 NOTE — Progress Notes
Therapy infusion plan placed for IV iron, Infed 1000 mg given once. Iron level and saturation low per below. Hgb dropped to 8.5 when checked at hematology visit.  This was reviewed with her SNF, [facility]. They were to repeat CBC this week and assist our office in getting Danielle [name] IV iron. Component    Latest Ref Rng 03/26/2023 Iron    37 - 145 ug/dL 26 (L)  Iron Saturation    15 - 50 % 9 (L)  TIBC    250 - 450 ug/dL 782  Ferritin    13 - 956 ng/mL 40   Legend:(L) Low

## 2023-04-08 ENCOUNTER — Telehealth: Admit: 2023-04-08 | Payer: PRIVATE HEALTH INSURANCE | Attending: Family | Primary: Internal Medicine

## 2023-04-08 NOTE — Telephone Encounter
2nd message for Marsha/nursing team at [facility] nursing/rehab re: scheduling pt for one dose IV infed 1g here at [facility]. Clinic number provided with request to speak with myself upon RC. Electronically Signed by Benay Pillow, RN, April 08, 2023

## 2023-04-13 ENCOUNTER — Telehealth: Admit: 2023-04-13 | Payer: PRIVATE HEALTH INSURANCE | Attending: Family | Primary: Internal Medicine

## 2023-04-13 NOTE — Telephone Encounter
3rd call to facility to arrange IV infed appt. Initially asked to speak with Mindi Junker (as noted contact) - was disconnected. Called again, asked to speak with the nurse taking care of [place] today - was sent to a voice mail. Detailed message left, with request to call back (number provided) and to ask for me directly. Electronically Signed by Benay Pillow, RN, April 13, 2023

## 2023-04-15 ENCOUNTER — Telehealth: Admit: 2023-04-15 | Payer: PRIVATE HEALTH INSURANCE | Attending: Family | Primary: Internal Medicine

## 2023-04-15 NOTE — Telephone Encounter
4th call placed to [facility] (where pt currently resides), asked to speak with nursing taking care of pt - went right to VM again. Return call placed, asked to speak with nursing supervisor, that too went to VM. Attempting to arrange for Danielle [name] to come to our clinic for 1g IV infed. Will update Danielle Laura, APRN and my APSM re: unsuccessful attempts (x 4) to speak with anyone re: pt/care. Multiple detailed voice mail messages left, with no r/c. Electronically Signed by Benay Pillow, RN, April 15, 2023

## 2023-04-27 ENCOUNTER — Telehealth: Admit: 2023-04-27 | Payer: PRIVATE HEALTH INSURANCE | Attending: Family | Primary: Internal Medicine

## 2023-04-27 NOTE — Telephone Encounter
TC:  [facility], finally got in touch with someone.  They state Danielle [name] is no longer at their facility.TCT:  Called sister, Danielle [name] - discussed our hematology office has been trying to reach Danielle [name] to setup IV iron.  [facility] did not repeat her CBC or return our calls after multiple attempts.  Danielle [name] states [facility] was not a good place and should be closed.  Danielle [name] was moved to [facility], SNF in [place].  Will try to get her IV iron down towards that way.

## 2023-04-29 ENCOUNTER — Telehealth: Admit: 2023-04-29 | Payer: PRIVATE HEALTH INSURANCE | Primary: Internal Medicine

## 2023-04-29 NOTE — Telephone Encounter
Received a message from [facility] who has only seen this pt once. She contacted Dr Nedra Hai to schedule this pt for [facility]. Pt has moved from [place] to [facility] in [place].I contacted the pt's sisiter Jacquenette Shone to discuss setting her sister up for out pt iron.She needs to confirm with the rehab that they will be able to transport the pt to our location and that her insurance will allow her to receive out pt medication while she is in rehab,[name] has our number and will contact me as soon as she has confirmation.

## 2023-05-01 ENCOUNTER — Other Ambulatory Visit: Admit: 2023-05-01 | Payer: PRIVATE HEALTH INSURANCE | Attending: Nephrology | Primary: Internal Medicine

## 2023-05-01 DIAGNOSIS — D640 Hereditary sideroblastic anemia: Secondary | ICD-10-CM

## 2023-05-01 DIAGNOSIS — K5780 Diverticulitis of intestine, part unspecified, with perforation and abscess without bleeding: Secondary | ICD-10-CM

## 2023-05-01 DIAGNOSIS — D649 Anemia, unspecified: Secondary | ICD-10-CM

## 2023-05-01 LAB — IRON: BKR IRON: 23 ug/dL — ABNORMAL LOW (ref 40–150)

## 2023-05-01 LAB — CBC WITHOUT DIFFERENTIAL
BKR WAM ANALYZER ANC: 1.98 x 1000/ÂµL — ABNORMAL LOW (ref 2.00–7.60)
BKR WAM HEMATOCRIT (2 DEC): 32.8 % — ABNORMAL LOW (ref 35.00–45.00)
BKR WAM HEMOGLOBIN: 9.8 g/dL — ABNORMAL LOW (ref 11.7–15.5)
BKR WAM MCH (PG): 27.8 pg (ref 27.0–33.0)
BKR WAM MCHC: 29.9 g/dL — ABNORMAL LOW (ref 31.0–36.0)
BKR WAM MCV: 93.2 fL (ref 80.0–100.0)
BKR WAM MPV: 10.7 fL (ref 8.0–12.0)
BKR WAM PLATELETS: 322 x1000/ÂµL (ref 150–420)
BKR WAM RDW-CV: 14.5 % (ref 11.0–15.0)
BKR WAM RED BLOOD CELL COUNT.: 3.52 M/ÂµL — ABNORMAL LOW (ref 4.00–6.00)
BKR WAM WHITE BLOOD CELL COUNT: 4.6 x1000/ÂµL (ref 4.0–11.0)

## 2023-05-23 ENCOUNTER — Encounter: Admit: 2023-05-23 | Payer: PRIVATE HEALTH INSURANCE | Primary: Internal Medicine

## 2023-05-23 ENCOUNTER — Inpatient Hospital Stay: Admit: 2023-05-23 | Discharge: 2023-05-26 | Payer: MEDICARE | Attending: Hospitalist | Primary: Nephrology

## 2023-05-23 DIAGNOSIS — D649 Anemia, unspecified: Secondary | ICD-10-CM

## 2023-05-23 DIAGNOSIS — G9340 Encephalopathy, unspecified: Secondary | ICD-10-CM

## 2023-05-23 DIAGNOSIS — G8220 Paraplegia, unspecified: Secondary | ICD-10-CM

## 2023-05-23 DIAGNOSIS — R1310 Dysphagia, unspecified: Secondary | ICD-10-CM

## 2023-05-23 DIAGNOSIS — L03115 Cellulitis of right lower limb: Secondary | ICD-10-CM

## 2023-05-23 DIAGNOSIS — M069 Rheumatoid arthritis, unspecified: Secondary | ICD-10-CM

## 2023-05-23 DIAGNOSIS — I82419 Acute embolism and thrombosis of unspecified femoral vein: Secondary | ICD-10-CM

## 2023-05-23 DIAGNOSIS — K219 Gastro-esophageal reflux disease without esophagitis: Secondary | ICD-10-CM

## 2023-05-24 ENCOUNTER — Emergency Department: Admit: 2023-05-24 | Payer: MEDICARE | Primary: Internal Medicine

## 2023-05-24 ENCOUNTER — Ambulatory Visit: Admit: 2023-05-24 | Payer: MEDICARE | Primary: Internal Medicine

## 2023-05-24 DIAGNOSIS — K922 Gastrointestinal hemorrhage, unspecified: Secondary | ICD-10-CM

## 2023-05-24 LAB — CBC WITH AUTO DIFFERENTIAL
BKR WAM ABSOLUTE IMMATURE GRANULOCYTES.: 0.01 x 1000/ÂµL (ref 0.00–0.30)
BKR WAM ABSOLUTE IMMATURE GRANULOCYTES.: 0.01 x 1000/ÂµL (ref 0.00–0.30)
BKR WAM ABSOLUTE LYMPHOCYTE COUNT.: 1.77 x 1000/ÂµL (ref 0.60–3.70)
BKR WAM ABSOLUTE LYMPHOCYTE COUNT.: 1.42 x 1000/ÂµL (ref 0.60–3.70)
BKR WAM ABSOLUTE NRBC (2 DEC): 0 x 1000/ÂµL (ref 0.00–1.00)
BKR WAM ABSOLUTE NRBC (2 DEC): 0 x 1000/ÂµL (ref 0.00–1.00)
BKR WAM ANALYZER ANC: 1.98 x 1000/ÂµL — ABNORMAL LOW (ref 2.00–7.60)
BKR WAM ANALYZER ANC: 2.17 x 1000/ÂµL (ref 2.00–7.60)
BKR WAM BASOPHIL ABSOLUTE COUNT.: 0.05 x 1000/ÂµL (ref 0.00–1.00)
BKR WAM BASOPHIL ABSOLUTE COUNT.: 0.05 x 1000/ÂµL (ref 0.00–1.00)
BKR WAM BASOPHILS: 1.1 % (ref 0.0–1.4)
BKR WAM BASOPHILS: 1.1 % (ref 0.0–1.4)
BKR WAM EOSINOPHIL ABSOLUTE COUNT.: 0.27 x 1000/ÂµL (ref 0.00–1.00)
BKR WAM EOSINOPHIL ABSOLUTE COUNT.: 0.35 x 1000/ÂµL (ref 0.00–1.00)
BKR WAM EOSINOPHILS: 5.9 % — ABNORMAL HIGH (ref 0.0–5.0)
BKR WAM EOSINOPHILS: 7.5 % — ABNORMAL HIGH (ref 0.0–5.0)
BKR WAM HEMATOCRIT (2 DEC): 31.3 % — ABNORMAL LOW (ref 35.00–45.00)
BKR WAM HEMATOCRIT (2 DEC): 32.9 % — ABNORMAL LOW (ref 35.00–45.00)
BKR WAM HEMOGLOBIN: 9.7 g/dL — ABNORMAL LOW (ref 11.7–15.5)
BKR WAM HEMOGLOBIN: 9.6 g/dL — ABNORMAL LOW (ref 11.7–15.5)
BKR WAM IMMATURE GRANULOCYTES: 0.2 % (ref 0.0–1.0)
BKR WAM IMMATURE GRANULOCYTES: 0.2 % (ref 0.0–1.0)
BKR WAM LYMPHOCYTES: 38.8 % (ref 17.0–50.0)
BKR WAM LYMPHOCYTES: 30.5 % (ref 17.0–50.0)
BKR WAM MCH (PG): 27.6 pg (ref 27.0–33.0)
BKR WAM MCH (PG): 27 pg (ref 27.0–33.0)
BKR WAM MCHC: 31 g/dL (ref 31.0–36.0)
BKR WAM MCHC: 29.2 g/dL — ABNORMAL LOW (ref 31.0–36.0)
BKR WAM MCV: 88.9 fL (ref 80.0–100.0)
BKR WAM MCV: 92.4 fL (ref 80.0–100.0)
BKR WAM MONOCYTE ABSOLUTE COUNT.: 0.48 x 1000/ÂµL (ref 0.00–1.00)
BKR WAM MONOCYTE ABSOLUTE COUNT.: 0.65 x 1000/ÂµL (ref 0.00–1.00)
BKR WAM MONOCYTES: 10.5 % (ref 4.0–12.0)
BKR WAM MONOCYTES: 14 % — ABNORMAL HIGH (ref 4.0–12.0)
BKR WAM MPV: 10.1 fL (ref 8.0–12.0)
BKR WAM MPV: 9.9 fL (ref 8.0–12.0)
BKR WAM NEUTROPHILS: 43.5 % (ref 39.0–72.0)
BKR WAM NEUTROPHILS: 46.7 % (ref 39.0–72.0)
BKR WAM NUCLEATED RED BLOOD CELLS: 0 % (ref 0.0–1.0)
BKR WAM NUCLEATED RED BLOOD CELLS: 0 % (ref 0.0–1.0)
BKR WAM PLATELETS: 295 x1000/ÂµL (ref 150–420)
BKR WAM PLATELETS: 273 x1000/ÂµL — ABNORMAL LOW (ref 150–420)
BKR WAM RDW-CV: 14.1 % (ref 11.0–15.0)
BKR WAM RDW-CV: 14.1 % (ref 11.0–15.0)
BKR WAM RED BLOOD CELL COUNT.: 3.52 M/ÂµL — ABNORMAL LOW (ref 4.00–6.00)
BKR WAM RED BLOOD CELL COUNT.: 3.56 M/ÂµL — ABNORMAL LOW (ref 4.00–6.00)
BKR WAM WHITE BLOOD CELL COUNT: 4.6 x1000/ÂµL (ref 4.0–11.0)
BKR WAM WHITE BLOOD CELL COUNT: 4.7 x1000/ÂµL (ref 4.0–11.0)

## 2023-05-24 LAB — BASIC METABOLIC PANEL
BKR ANION GAP: 6 g/dL — ABNORMAL LOW (ref 5–18)
BKR BLOOD UREA NITROGEN: 7 mg/dL — ABNORMAL LOW (ref 8–25)
BKR BUN / CREAT RATIO: 12.1 (ref 8.0–25.0)
BKR CALCIUM: 9.8 mg/dL (ref 8.4–10.3)
BKR CHLORIDE: 114 mmol/L (ref 95–115)
BKR CO2: 21 mmol/L (ref 21–32)
BKR CREATININE: 0.58 mg/dL (ref 0.50–1.30)
BKR EGFR, CREATININE (CKD-EPI 2021): >60 mL/min/{1.73_m2} — ABNORMAL HIGH (ref >=60–5.0)
BKR GLUCOSE: 86 mg/dL (ref 70–100)
BKR OSMOLALITY CALCULATION: 279 mOsm/kg — ABNORMAL HIGH (ref 275–295)
BKR POTASSIUM: 3.8 mmol/L — AB (ref 3.5–5.1)
BKR SODIUM: 141 mmol/L (ref 136–145)

## 2023-05-24 LAB — URINALYSIS WITH CULTURE REFLEX      (BH LMW YH)
BKR BILIRUBIN, UA: NEGATIVE
BKR GLUCOSE, UA: NEGATIVE
BKR KETONES, UA: NEGATIVE
BKR NITRITE, UA: NEGATIVE
BKR PH, UA: 6 (ref 5.5–7.5)
BKR SPECIFIC GRAVITY, UA: 1.017 (ref 1.005–1.030)
BKR UROBILINOGEN, UA (MG/DL): <2 mg/dL (ref <=2.0)

## 2023-05-24 LAB — COMPREHENSIVE METABOLIC PANEL
BKR A/G RATIO: 0.7
BKR ALANINE AMINOTRANSFERASE (ALT): 12 U/L (ref 12–78)
BKR ALBUMIN: 3.1 g/dL — ABNORMAL LOW (ref 3.4–5.0)
BKR ALKALINE PHOSPHATASE: 71 U/L (ref 20–120)
BKR ANION GAP: 6 (ref 5–18)
BKR ASPARTATE AMINOTRANSFERASE (AST): 13 U/L (ref 5–37)
BKR AST/ALT RATIO: 1.1
BKR BILIRUBIN TOTAL: 0.1 mg/dL (ref 0.0–1.0)
BKR BLOOD UREA NITROGEN: 10 mg/dL (ref 8–25)
BKR BUN / CREAT RATIO: 13.9 (ref 8.0–25.0)
BKR CALCIUM: 10 mg/dL (ref 8.4–10.3)
BKR CHLORIDE: 111 mmol/L (ref 95–115)
BKR CO2: 25 mmol/L (ref 21–32)
BKR CREATININE: 0.72 mg/dL (ref 0.50–1.30)
BKR EGFR, CREATININE (CKD-EPI 2021): >60 mL/min/{1.73_m2} (ref >=60)
BKR GLOBULIN: 4.2 g/dL
BKR GLUCOSE: 92 mg/dL (ref 70–100)
BKR OSMOLALITY CALCULATION: 282 mosm/kg (ref 275–295)
BKR POTASSIUM: 3.8 mmol/L (ref 3.5–5.1)
BKR PROTEIN TOTAL: 7.3 g/dL (ref 6.4–8.2)
BKR SODIUM: 142 mmol/L (ref 136–145)

## 2023-05-24 LAB — PT/INR AND PTT (BH GH L LMW YH)
BKR INR: 1.07 (ref 0.88–1.14)
BKR PARTIAL THROMBOPLASTIN TIME: 27.6 s (ref 23.0–31.0)
BKR PROTHROMBIN TIME: 11.3 s (ref 9.4–12.0)

## 2023-05-24 LAB — HEMOGLOBIN AND HEMATOCRIT, BLOOD
BKR WAM HEMATOCRIT (2 DEC): 33.5 % — ABNORMAL LOW (ref 35.00–45.00)
BKR WAM HEMOGLOBIN: 9.9 g/dL — ABNORMAL LOW (ref 11.7–15.5)

## 2023-05-24 LAB — URINE MICROSCOPIC     (BH GH LMW YH)
BKR RBC/HPF INSTRUMENT: 122 /HPF — ABNORMAL HIGH (ref 0–2)
BKR URINE SQUAMOUS EPITHELIAL CELLS, UA (NUMERIC): <1 /HPF (ref 0–5)
BKR WBC/HPF INSTRUMENT: 8 /HPF — ABNORMAL HIGH (ref 0–5)

## 2023-05-24 LAB — IRON AND TIBC
BKR IRON SATURATION (SCCCT BH GH LM): 13 % (ref 11–46)
BKR IRON: 40 ug/dL (ref 40–150)
BKR TOTAL IRON BINDING CAPACITY (SCCCT  BH GH LM): 317 ug/dL (ref 250–450)

## 2023-05-24 LAB — FERRITIN: BKR FERRITIN: 16 ng/mL (ref 8–252)

## 2023-05-24 LAB — PHOSPHORUS     (BH GH L LMW YH): BKR PHOSPHORUS: 3 mg/dL (ref 2.5–4.9)

## 2023-05-24 LAB — UA REFLEX CULTURE

## 2023-05-24 LAB — MAGNESIUM: BKR MAGNESIUM: 1.8 mg/dL (ref 1.4–2.2)

## 2023-05-24 MED ORDER — PANTOPRAZOLE 40 MG TABLET,DELAYED RELEASE
40 mg | Freq: Every day | ORAL | Status: DC
Start: 2023-05-24 — End: 2023-05-27
  Administered 2023-05-25 – 2023-05-26 (×2): 40 mg via ORAL

## 2023-05-24 MED ORDER — MELATONIN 3 MG TABLET
3 mg | Freq: Every evening | ORAL | Status: DC
Start: 2023-05-24 — End: 2023-05-24

## 2023-05-24 MED ORDER — GABAPENTIN 600 MG TABLET
600 mg | Freq: Every evening | ORAL | Status: DC
Start: 2023-05-24 — End: 2023-05-27
  Administered 2023-05-25 – 2023-05-26 (×2): 600 mg via ORAL

## 2023-05-24 MED ORDER — MELATONIN 3 MG TABLET
3 mg | Freq: Every evening | ORAL | Status: DC | PRN
Start: 2023-05-24 — End: 2023-05-27

## 2023-05-24 MED ORDER — SODIUM CHLORIDE 0.9 % (FLUSH) INJECTION SYRINGE
0.9 % | INTRAVENOUS | Status: DC | PRN
Start: 2023-05-24 — End: 2023-05-27

## 2023-05-24 MED ORDER — TRAMADOL 50 MG TABLET
50 mg | Freq: Three times a day (TID) | ORAL | Status: DC | PRN
Start: 2023-05-24 — End: 2023-05-27

## 2023-05-24 MED ORDER — APIXABAN 5 MG TABLET
5 mg | Freq: Two times a day (BID) | GASTROSTOMY | Status: DC
Start: 2023-05-24 — End: 2023-05-27
  Administered 2023-05-25 – 2023-05-26 (×2): 5 mg via GASTROSTOMY

## 2023-05-24 MED ORDER — PANTOPRAZOLE IV PUSH 40 MG VIAL & NS (ADULTS)
Freq: Two times a day (BID) | INTRAVENOUS | Status: DC
Start: 2023-05-24 — End: 2023-05-24
  Administered 2023-05-24: 13:00:00 10.000 mL via INTRAVENOUS

## 2023-05-24 MED ORDER — ROSUVASTATIN 10 MG TABLET
10 mg | Freq: Every evening | GASTROSTOMY | Status: DC
Start: 2023-05-24 — End: 2023-05-27
  Administered 2023-05-25 – 2023-05-26 (×2): 10 mg via GASTROSTOMY

## 2023-05-24 MED ORDER — SODIUM CHLORIDE 0.9 % (FLUSH) INJECTION SYRINGE
0.9 % | Freq: Three times a day (TID) | INTRAVENOUS | Status: DC
Start: 2023-05-24 — End: 2023-05-27
  Administered 2023-05-25 – 2023-05-26 (×2): 0.9 mL via INTRAVENOUS

## 2023-05-24 MED ORDER — HYDROXYCHLOROQUINE 200 MG TABLET
200 mg | Freq: Every day | ORAL | Status: DC
Start: 2023-05-24 — End: 2023-05-27
  Administered 2023-05-24 – 2023-05-26 (×3): 200 mg via ORAL

## 2023-05-24 MED ORDER — TAMSULOSIN 0.4 MG CAPSULE
0.4 mg | Freq: Every evening | ORAL | Status: DC
Start: 2023-05-24 — End: 2023-05-27
  Administered 2023-05-25 – 2023-05-26 (×2): 0.4 mg via ORAL

## 2023-05-24 MED ORDER — PANTOPRAZOLE IV PUSH 40 MG VIAL & NS (ADULTS)
Freq: Once | INTRAVENOUS | Status: CP
Start: 2023-05-24 — End: ?
  Administered 2023-05-24: 06:00:00 10.000 mL via INTRAVENOUS

## 2023-05-24 MED ORDER — IOHEXOL 350 MG IODINE/ML INTRAVENOUS SOLUTION
350 mg iodine/mL | Freq: Once | INTRAVENOUS | Status: CP | PRN
Start: 2023-05-24 — End: ?
  Administered 2023-05-24: 06:00:00 350 mL via INTRAVENOUS

## 2023-05-24 MED ORDER — TRAMADOL 50 MG TABLET
50 mg | Freq: Every day | ORAL | Status: DC
Start: 2023-05-24 — End: 2023-05-27
  Administered 2023-05-24 – 2023-05-26 (×3): 50 mg via ORAL

## 2023-05-24 MED ORDER — ZZ IMS TEMPLATE
Freq: Two times a day (BID) | ORAL | Status: DC
Start: 2023-05-24 — End: 2023-05-27
  Administered 2023-05-24 – 2023-05-26 (×5): 400 mg via ORAL

## 2023-05-24 MED ORDER — ACETAMINOPHEN 325 MG TABLET
325 mg | Freq: Four times a day (QID) | ORAL | Status: DC | PRN
Start: 2023-05-24 — End: 2023-05-27

## 2023-05-24 MED ORDER — TRAMADOL 50 MG TABLET
50 mg | Freq: Three times a day (TID) | ORAL | Status: AC | PRN
Start: 2023-05-24 — End: ?

## 2023-05-24 MED ORDER — DEXTROSE 5 % AND 0.45 % SODIUM CHLORIDE INTRAVENOUS SOLUTION
INTRAVENOUS | Status: DC
Start: 2023-05-24 — End: 2023-05-25
  Administered 2023-05-24 – 2023-05-25 (×2): 1000.000 mL/h via INTRAVENOUS

## 2023-05-24 MED ORDER — FERROUS GLUCONATE 324 MG (38 MG IRON) TABLET
32438 mg (38 mg iron) | ORAL | Status: DC
Start: 2023-05-24 — End: 2023-05-27
  Administered 2023-05-24 – 2023-05-26 (×2): 324 mg (38 mg iron) via ORAL

## 2023-05-24 NOTE — Progress Notes
Pt Alert and oriented x4. VSS on room air. No sob/acute distress noted. All due meds given per [facility]. Total care provided. Ostomy care done, dark brown stool noted. Pure wick in placed. IVF in progressed. Safety/fall precautions maintained. Call bell in reached.

## 2023-05-24 NOTE — Utilization Review (ED)
GI Bleeding, history of hemicolectomy, hold eliquis, NPO, plan for GI consult, Hemoglobin 9.7, hematocrit 31, does not meet inpatient criteria, code 44 applied, secondary review with physician advisor, Dr. Hadassah Pais Status: Meets Observation Status

## 2023-05-24 NOTE — Plan of Care
67 y/o female admitted as Obs status for GI bleed and pending GI Consult.  Pt a resident of [facility] having LTC is bed bound and total care.Spoke w pt's Sister and Conservator Audley Hose who reports that pt is to return to [facility] when medically stable.Obs status stay reviewed w pt's sister and she's aware of the following:Discuss with pt's sister/Conservator [name] Medicare Observation Status - made aware that Obs billing status carries no discrepancies of care as oppose to Inpatient status admission however hospital stay is limited to 24-48 hours.  [name] is also made aware that Medicare Observation is an outpatient billing covered 80% by Part B Medicare and remaining 20% will be covered by self-pay or co-insurance. Likewise told pt that Medicare Observation stay does not qualify pt to avail of SNF benefits which carries a requirement of 3 inpatient overnight hospital stay.  Pt advised that clinical progress will be monitored closely and status can be adjusted to Inpatient if warranted. Loney Hering, RN, BSNCase ManagementMHB:  [phone]

## 2023-05-24 NOTE — ED Provider Notes
 Chief Complaint Patient presents with  GI Problem   [facility] C/O BLOOD IN COLOSTOMY BAG SINCE THIS AFTERNOON. ON ELIQUIS  FOR EMBOLISM. DENIES SYMPTOMS SUCH AS DIZZINESS, WEAKNESS, SOB/CP. HX OF DIVERTICULITIS WITH RESECTION, HX OF ANEMIA ON IRON . PT IS A&OX 4 ON ARRIVAL, NAD.  HPI/PE:67 yo F with PMhx of DVT on Eliquis , RA, anemia, perforated diverticulitis 10/2021 s/p excellent and right hemicolectomy/ileostomy, non ambulatory presents from SNF for GI bleed.According to the patient she was noted to have blood in her ostomy bag yesterday afternoon.  Patient is currently on Eliquis  and she is unsure if they gave her her nightly dose.  They sent her in for evaluation.  Patient denies chest pain, shortness of breath, abdominal painMDM:Briefly this is a 67 year old female presents to the ER with maroon-colored stool in her ostomy bag.  The stool is guaiac positive.  She has a nontender abdomen.  Patient's vital signs are stable on arrival.Differential considered, upper GI bleed, diverticular bleed, colitisLabs to include CBC, chemistry, PT PTTCT abdomen pelvis40 mg IV ProtonixLabs were reviewed by me.  Patient is anemic but it is her baseline.  Her hemoglobin hematocrit are 10/31.  Her BUN and creatinine are normal.Patient will be admitted to telemetry on the hospitalist service.  At this time patient does not require a blood transfusion.  Will repeat her CBC and follow up closely.I spoke with patient's sister who is also her conservator -ReneeAn acute or life threatening problem was considered during this evaluation  A decision regarding hospitalization was made during this visit  Independent interpretation of: ECGDirectly spoke with:  Hospitalist (Dr Carma)  Physical ExamED Triage Vitals [05/23/23 2345]BP: 110/70Pulse: (!) 55Pulse from  O2 sat: n/aResp: 15Temp: 98.1 ?F (36.7 ?C)Temp src: OralSpO2: 99 % BP 112/66  - Pulse 61  - Temp 98.1 ?F (36.7 ?C) (Oral)  - Resp 20  - Wt 58 kg  - SpO2 98%  - BMI 18.35 kg/m? Physical ExamVitals and nursing note reviewed. Constitutional:     General: She is not in acute distress.   Appearance: Normal appearance. She is normal weight. She is not ill-appearing. Cardiovascular:    Rate and Rhythm: Normal rate and regular rhythm.    Pulses: Normal pulses.    Heart sounds: Normal heart sounds. Pulmonary:    Effort: Pulmonary effort is normal.    Breath sounds: Normal breath sounds. Abdominal:    General: Bowel sounds are normal.    Palpations: Abdomen is soft.    Tenderness: There is no abdominal tenderness.    Comments: Right-sided ostomy.  Maroon-colored stool in the ostomy bag which is guaiac positive.  There is a small wound on the left abdomen that has granulation tissue.  Leftover from old feeding tube site. Musculoskeletal:    Comments: Patient's lower extremities are contracted. Skin:   General: Skin is warm.    Capillary Refill: Capillary refill takes less than 2 seconds. Neurological:    Mental Status: She is alert and oriented to person, place, and time. Mental status is at baseline. Results for orders placed or performed during the hospital encounter of 05/23/23 CBC auto differential Result Value Ref Range  WBC 4.6 4.0 - 11.0 x1000/?L  RBC 3.52 (L) 4.00 - 6.00 M/?L  Hemoglobin 9.7 (L) 11.7 - 15.5 g/dL  Hematocrit 68.69 (L) 64.99 - 45.00 %  MCV 88.9 80.0 - 100.0 fL  MCH 27.6 27.0 - 33.0 pg  MCHC 31.0 31.0 - 36.0 g/dL  RDW-CV 85.8 88.9 - 84.9 %  Platelets  295 150 - 420 x1000/?L  MPV 10.1 8.0 - 12.0 fL  Neutrophils 43.5 39.0 - 72.0 %  Lymphocytes 38.8 17.0 - 50.0 %  Monocytes 10.5 4.0 - 12.0 %  Eosinophils 5.9 (H) 0.0 - 5.0 %  Basophil 1.1 0.0 - 1.4 %  Immature Granulocytes 0.2 0.0 - 1.0 %  nRBC 0.0 0.0 - 1.0 %  ANC(Abs Neutrophil Count) 1.98 (L) 2.00 - 7.60 x 1000/?L  Absolute Lymphocyte Count 1.77 0.60 - 3.70 x 1000/?L  Monocyte Absolute Count 0.48 0.00 - 1.00 x 1000/?L  Eosinophil Absolute Count 0.27 0.00 - 1.00 x 1000/?L  Basophil Absolute Count 0.05 0.00 - 1.00 x 1000/?L  Absolute Immature Granulocyte Count 0.01 0.00 - 0.30 x 1000/?L  Absolute nRBC 0.00 0.00 - 1.00 x 1000/?L Comprehensive metabolic panel Result Value Ref Range  Sodium 142 136 - 145 mmol/L  Potassium 3.8 3.5 - 5.1 mmol/L  Chloride 111 95 - 115 mmol/L  CO2 25 21 - 32 mmol/L  Anion Gap 6 5 - 18  Glucose 92 70 - 100 mg/dL  BUN 10 8 - 25 mg/dL  Creatinine 9.27 9.49 - 1.30 mg/dL  Calcium  10.0 8.4 - 10.3 mg/dL  BUN/Creatinine Ratio 86.0 8.0 - 25.0  Total Protein 7.3 6.4 - 8.2 g/dL  Albumin 3.1 (L) 3.4 - 5.0 g/dL  Total Bilirubin 0.1 0.0 - 1.0 mg/dL  Alkaline Phosphatase 71 20 - 120 U/L  Alanine Aminotransferase (ALT) 12 12 - 78 U/L  Aspartate Aminotransferase (AST) 13 5 - 37 U/L  Globulin 4.2 g/dL  A/G Ratio 0.7   AST/ALT Ratio 1.1 Reference Range Not Established  Osmolality Calculation 282 275 - 295 mOsm/kg  eGFR (Creatinine) >60 >=60 mL/min/1.3m2 Type and screen Result Value Ref Range  ABO Grouping B   Rh Type POS   Antibody Screen NEG  [place] Abdomen Pelvis w IV Contrast (No Oral) Preliminary Result   1. No contrast extravasation in the stomach, small or large bowel loops to suggest active gastrointestinal hemorrhage at the time of examination.  2. Mild persistent gastric wall thickening, which may represent gastritis. Recommend clinical correlation.  3. Cholelithiasis without evidence of acute cholecystitis.  4. Nonobstructing bilateral renal calculi, unchanged since the prior examination.Diminished enhancement of the left kidney compared to the right without perinephric fat stranding or uroepithelial thickening. A 3 mm calcific density in the left retroperitoneum, which may represent a phlebolith versus a ureteral calculus. Recommend clinical correlation.  5. Colonic diverticulosis without evidence of diverticulitis.  6. Other findings as described above.      Report Discussion Details:  Results Discussed With : Harlene Rummer, PA at 02:56 AM 05/24/2023   Date of Order: 05/24/2023 2:01:52 AM Report Electronically Signed By: Fredia Rous, MD Signature Date/Time: 05/24/2023 3:19:24 AM  Clinical Impressions as of 05/24/23 0138 Gastrointestinal hemorrhage, unspecified gastrointestinal hemorrhage type  ED DispositionAdmit Rummer Harlene, PA06/02/24 LUNDY Rummer Harlene, PA06/02/24 0345 Rummer Harlene, PA06/02/24 LUCIE Rummer Harlene, PA06/02/24 0423

## 2023-05-24 NOTE — ED Notes
1:26 AM - OSTOMY BAG FULL OF DARK RED PUDDING LIKE STOOL, BAG WAS UNATTACHED TO PT AND LEAKING. CHANGED OSTOMY BAG, OSTOMY IN SIZE, SKIN AROUND STOMA WAS CLEANED AND DRIED, NO ABNORMALITIES NOTED. STOOL IN NEW STOMA BAG IS DARK, LESS RED. PT DENIES PAIN, VITALS STABLE, SAFETY MAINTAINED.

## 2023-05-24 NOTE — H&P
Hospitalist H&PConsults already done by ED: NoneHistory provided by: the patient and EMR reviewPatient presents from: Skilled nursing facilityHistory limited by: patient/patient representative poor historian  Chief Complaint: Blood in ostomy bagHPI: Patient is a 67 y.o.female with a PMHx significant for DVT on apixaban, RA, perforated diverticulitis s/p right hemicolectomy + end-ileostomy presented on 05/23/2023 11:34 PM with maroon stool ileostomy output. Per staff at SNF, blood was noted in ostomy bag yesterday afternoon, so sent in to ED for eval. Patient reports otherwise feeling well, no abdominal pain, nausea, or vomiting.Of note patient was recently discharged [facility] ([facility] -> 3/21) with septic shock 2/2 ESBL E coli UTI + bacteremia, briefly requiring pressor support and stress-dose steroids. Discharged to complete 2 weeks IV ertapenem. ER Course: Vitals on admission: BP 110/70, HR 55, SpO2 99% on RA, T 98.1FPertinent admission labs: CMP wnl, Hb 9.7, MCV 88.9CT-AP: pending- Ostomy site cleaned, no external bleeding site noted- Stool Guaiac +ve- No further interventions in EDHistories: Medical/Surgical:- H/o DVT (Feb 2023), on apixaban- Rheumatoid arthritis- Perforated diverticulitis (2021), s/p right hemicolectomy + ileostomy formation- Prior PEG tube placement, removed June 2023Prior adverse drug reactions:- NKDASocial:- Currently resident at [facility] for [facility]- Has conservator in place, but also has family locallyObjective: Vitals:Temp:  [98.1 ?F (36.7 ?C)] 98.1 ?F (36.7 ?C)Pulse:  [55-61] 61Resp:  [15-20] 20BP: (110-112)/(66-70) 112/66SpO2:  [98 %-99 %] 98 %Device (Oxygen Therapy): room airPhysical Exam:General: Alert, comfortable, no distress/increased WOBHEENT: PERRL, EOMI, MMM.Resp: Clear to bases, no creps/wheezeCVS: HS 1 + 2 + 0, no murmurs appreciated. RRRAbdo: Soft, non-tender, non-distended. BS active. Ileostomy in RLQ with dark stool in bag. Non-healing PEG site in mid-abdomen.Extr: No peripheral oedema, calves equal SNT, WWPSkin: No erythema or ecchymosis. Skin warm and dry. No rashes seen. Neurologic: AAOx3. No facial asymmetry, speech fluent. Labs: BMP CBC/COAG Recent Labs Lab 06/02/240004 NA 142 K 3.8 CL 111 CO2 25 BUN 10 CREATININE 0.72 GLU 92 ANIONGAP 6 Recent Labs Lab 06/02/240004 ALT 12 AST 13 ALKPHOS 71 BILITOT 0.1 PROT 7.3 ALBUMIN 3.1*  Recent Labs Lab 06/02/240004 WBC 4.6 HGB 9.7* HCT 31.30* PLT 295  Recent Labs Lab 06/02/240004 NEUTROPHILS 43.5 No results for input(s): INR, PTT in the last 168 hours. Cardiac Enzymes pBNP No results for input(s): TROPONINT, CKTOTAL, CKMB in the last 168 hours.Invalid input(s): LNK, TROPONINPOC No components found for: BTYPENATRIURNo results found for: BNP Cardiovascular Risk Factors (Lipid, A1C), TSH  No results found for: CHOL, HDL, LDL, TRIG Lab Results Component Value Date  HGBA1C 5.5 03/09/2023 Lab Results Component Value Date  TSH 0.527 03/09/2023  Other Labs   Microbiology: - No active micro- H/o ESBL E coliAssessment/Plan: Danielle [name] is a 67 y.o. female with PMHx of DVT on apixaban, RA, perforated diverticulitis s/p right hemicolectomy + end-ileostomy. Patient presents from LTAC with h/o maroon-colored stool from ileostomy. Admission work-up significant for stable normocytic anemia. Magee-AP pending. Given recent admission to OSH with septic shock requiring stress-dose steroid, primary concern would be for bleeding ulcer. Will admit patient for serial H&H and GI consult in AM.Consults: None# ? GI bleeding from ileostomy# H/o right hemicolectomy# Chronic anemia- NPO for now- Hold apixaban, no NSAIDs- Start BID IV pantoprazole- Trend H&H every BID for now as is currently stable- Recommend GI consult in AM- Maintain active type and screen- Check iron studies- Follow-up Duncan-AP# RA- C/w HQC 200mg  daily- Currently off steroids# Immobility- C/w gabapentin 400mg  BID + 600mg  nightly- C/w tramadol 50mg  daily + Q8H PRN- PRN tylenol- PT eval once medically-readyOther meds:- C/w statinDiet:  Diet NPO Effective NowDisposition: FloorElectrolytes: replenish PRN; goal K > 4, Mg > 2Devices, lines: PIVProphylaxis: PPICode status: Full CodeSigned:Will Kelby Lotspeich MDMobile Heartbeat: [phone]/01/2023 2:19 AM

## 2023-05-24 NOTE — Utilization Review (ED)
UM Status: UR Reviewed-Continue Observation ServicesObs for GIB. Awaiting GI consult. H/H stable 9.6/32. IVF, NPO. VSS. Patient to remain in observation status at this time of review. UM to continue to follow.

## 2023-05-24 NOTE — Utilization Review (ED)
UM Status: UR Reviewed-Continue Observation Services

## 2023-05-24 NOTE — Plan of Care
Admission Note Nursing Danielle [name] is a 67 y.o. female admitted with a chief complaint of blood on ostomy bag. Patient arrived from  SNF NathanielPatient is   AOx4, on RTM w/ PO, on RA, Pt is NPO w/ IV D5 1/2 NS at 58ml/h, Pt is total care, mobility via WC. Skin is intact, ostomy bag on the RLQ. Continue to monitor Vitals:  05/24/23 0159 05/24/23 0259 05/24/23 0359 05/24/23 0526 BP: 121/63 110/61 114/61 106/64 Pulse: (!) 59 60 (!) 58 (!) 51 Resp: 14 (!) 13  18 Temp:    97.4 ?F (36.3 ?C) TempSrc:    Axillary SpO2: 98% 96% 97% 100% Weight:    62.2 kg Height:    5' 10 (1.778 m) Oxygen therapy Oxygen TherapySpO2: 100 %Device (Oxygen Therapy): room airI have reviewed the patient's current medication orders.     Current Facility-Administered Medications Medication Dose Route Frequency Provider Last Rate Last Admin  [Held by provider] apixaban (ELIQUIS) tablet 5 mg  5 mg Per G Tube Q12H Mercer Pod, MD      gabapentin (NEURONTIN) capsule 400 mg  400 mg Oral BID WC Mercer Pod, MD      gabapentin (NEURONTIN) tablet 600 mg  600 mg Oral Nightly Mercer Pod, MD      hydroxychloroquine (PLAQUENIL) tablet 200 mg  200 mg Oral Daily Mercer Pod, MD      pantoprazole (PROTONIX) 40 mg in sodium chloride 0.9% PF 10 mL (4 mg/mL)  40 mg IV Push Q12H Mercer Pod, MD      rosuvastatin (CRESTOR) tablet 10 mg  10 mg Per G Tube Nightly Mercer Pod, MD      sodium chloride 0.9 % flush 3 mL  3 mL IV Push Q8H Mercer Pod, MD      traMADoL Janean Sark) tablet 50 mg  50 mg Oral Daily Mercer Pod, MD       D5 1/2 NS 75 mL/hr (05/24/23 0238) acetaminophen, melatonin, sodium chloride, traMADoLCurrent Facility-Administered Medications Medication Dose Route Frequency Last Rate  acetaminophen  650 mg Oral Q6H PRN    [Held by provider] apixaban  5 mg Per G Tube Q12H    D5 1/2 NS 75 mL/hr Intravenous Continuous 75 mL/hr (05/24/23 0238)  gabapentin  400 mg Oral BID WC    gabapentin  600 mg Oral Nightly    hydroxychloroquine  200 mg Oral Daily    melatonin  3 mg Oral Nightly PRN    pantoprazole (PROTONIX) 40 mg in sodium chloride 0.9% PF 10 mL (4 mg/mL)  40 mg IV Push Q12H    rosuvastatin  10 mg Per G Tube Nightly    sodium chloride  3 mL IV Push Q8H    sodium chloride  3 mL IV Push PRN for Line Care    traMADoL  50 mg Oral Daily    traMADoL  50 mg Oral Q8H PRN   .I have reviewed patient valuables Belongings charted in last 7 days:  Comments: instrcuted to use of call bell and bed control.See flowsheets, patient education and plan of care for additional information. Plan of Care Overview/ Patient Status

## 2023-05-24 NOTE — Progress Notes
The patient was seen and examined.  She denies any significant complaints.  Denies pain in her stomach.  Denies fever or chills.  Device dizziness and lightheadedness.  Hemodynamically stable.  Asking for food.  On exam, alert and oriented times 3 but forgetful.  Normal skin color.  S1, S2, regular.  No rub, murmur, gallop. Abdomen soft, nontender, nondistended.  Patient has surgically created gastro cutaneous fistula, with gastric mucosa.  Right lower quadrant ileostomy with brown semi-solid stool.  No obvious blood.  Urine in purewick canister, clear, no blood  Lab work reviewed.  Anemia, hemoglobin 9 0.6/32.9.  Stable.  Iron panel consistent with chronic iron deficiency.  UA with microhematuria.Discussed with Dr. Ester Rink, GI.  Recommended Hold anticoagulation for several days.  Discussed with Hematology tomorrow regarding resuming or holding anticoagulation.  Ultrasound duplex lower extremity.  Monitor H&H b.i.d..  Advance diet.  Continue oral PPI.  Discussed with Dr. Jossie Ng, patient has 5 mm ureteral stone on left.  Neurology consulted, Dr. Hulen Luster For more information and full today's note please refer to H&P of Dr. Shella Spearing. Rinaldo Ratel MDPager [phone]

## 2023-05-24 NOTE — ED Notes
4:35 AM - Report given to [facility], arranged or transport, confirmed RTM placement with tele tech

## 2023-05-24 NOTE — Other
Urology Consult NoteCC: Chief Complaint Patient presents with  GI Problem   [facility] C/O BLOOD IN COLOSTOMY BAG SINCE THIS AFTERNOON. ON ELIQUIS FOR EMBOLISM. DENIES SYMPTOMS SUCH AS DIZZINESS, WEAKNESS, SOB/CP. HX OF DIVERTICULITIS WITH RESECTION, HX OF ANEMIA ON IRON. PT IS A&OX 4 ON ARRIVAL, NAD.  HPI: Patient is 67yo female w/ PMHx DVT (on Eliquis), RA, anemia, perforated diverticulitis s/p R hemicolectomy/ileostomy, non ambulatory, admitted overnight from SNF to the ED due to blood in ileostomy. ED workup significant for anemia. Initial CTAP in the ED significant for 5mm stone in the left ureter. Patient denies any abdominal or pelvic pain, no flank pain. She does report intermittent chronic low back pain, but states she rarely needs pain medication and has no current back pain at this time. No issues with urination, dysuria or hematuria. Denies any fever, chills, N/V, headache, CP or SOB. No other complaints. Home Medications: No current facility-administered medications on file prior to encounter. Current Outpatient Medications on File Prior to Encounter Medication Sig Dispense Refill  apixaban (ELIQUIS) 5 mg tablet 1 tablet (5 mg total) by Per G Tube route every 12 (twelve) hours.    famotidine (PEPCID) 20 mg tablet Take 1 tablet (20 mg total) by mouth daily. (Patient taking differently: Take 1 tablet (20 mg total) by mouth 2 (two) times daily.)    ferrous gluconate (FERGON) 324 mg (38 mg iron) tablet Take 1 tablet (324 mg total) by mouth every other day.    gabapentin (NEURONTIN) 400 mg capsule Take 1 capsule (400 mg total) by mouth 2 (two) times daily with breakfast and dinner.    gabapentin (NEURONTIN) 600 mg tablet Take 1 tablet (600 mg total) by mouth nightly.    hydrOXYchloroQUINE (PLAQUENIL) 200 mg tablet Take 1 tablet (200 mg total) by mouth daily. 14 tablet 0  potassium and sodium phosphates, ELEMENTAL phosphorus 250 mg/packet, (PHOS-NAK) oral powder Take 1 packet by mouth daily.    rosuvastatin (CRESTOR) 10 mg tablet 1 tablet (10 mg total) by Per G Tube route nightly.    traMADoL (ULTRAM) 50 mg tablet Take 1 tablet (50 mg total) by mouth daily.    acetaminophen (TYLENOL) 325 mg tablet Take 2 tablets (650 mg total) by mouth every 8 (eight) hours as needed for pain. 30 tablet 0  melatonin 3 mg tablet Take 1 tablet (3 mg total) by mouth nightly.  0  traMADoL (ULTRAM) 50 mg tablet Take 1 tablet (50 mg total) by mouth every 8 (eight) hours as needed for pain.    [DISCONTINUED] diclofenac (VOLTAREN) 1 % gel Apply 4 g topically 4 (four) times daily. 100 g 2  [DISCONTINUED] guar gum (BENEFIBER) packet Take 1 packet by mouth 3 (three) times daily with meals. 75 packet 12  [DISCONTINUED] insulin lispro (ADMELOG, HUMALOG) 100 unit/mL vial Inject under the skin 3 (three) times daily before meals. Use per sliding scale.    [DISCONTINUED] lidocaine 4 % topical patch Place 1 patch onto the skin every 24 hours. Remove & Discard patch within 12 hours or as directed by MD 30 patch   [DISCONTINUED] nystatin (MYCOSTATIN) 100,000 unit/gram powder Apply 1 Application  topically 2 (two) times daily. 15 g   [DISCONTINUED] ondansetron (ZOFRAN-ODT) 4 mg disintegrating tablet Place 1 tablet (4 mg total) onto the tongue every 8 (eight) hours as needed for nausea.    [DISCONTINUED] sodium chloride 0.9 % flush 3 mLs by IV Push route PRN for Line Care for line care. 5 mL  Allergies:  Patient has no known allergies.PMH: Past Medical History: Diagnosis Date  Acute embolism and thrombosis of unspecified femoral vein (HC Code)   Anemia   Cellulitis and abscess of right lower extremity   Dysphagia   Encephalopathy   GERD (gastroesophageal reflux disease)   Hypercalcemia   Paraplegia (HC Code)   Rheumatoid arthritis (HC Code)  PSH: Past Surgical History: Procedure Laterality Date  BAND HEMORRHOIDECTOMY CYSTECTOMY  1972  LAPAROSCOPIC SALPINGOOPHERECTOMY  1986  LAPAROTOMY  2022  PARTIAL COLECTOMY  2022  PEG TUBE PLACEMENT  01/2022  UPPER GASTROINTESTINAL ENDOSCOPY   Physical exam:Blood pressure 101/64, pulse (!) 54, temperature 98.8 ?F (37.1 ?C), temperature source Oral, resp. rate 17, height 5' 10 (1.778 m), weight 62.2 kg, SpO2 100 %.NAD, A&O x 3Unlabored breathingAbd soft, NT No CVA tendernessUOP in canister via purewik is clear, no hematuriaNo peripheral edemaComplete Blood CountResults in Past 7 DaysResult Component Current Result Hematocrit 32.90 (L) (05/24/2023) Hemoglobin 9.6 (L) (05/24/2023) MCH 27.0 (05/24/2023) MCHC 29.2 (L) (05/24/2023) MCV 92.4 (05/24/2023) MPV 9.9 (05/24/2023) Platelets 273 (05/24/2023) RBC 3.56 (L) (05/24/2023) WBC 4.7 (05/24/2023) Basic Metabolic PanelResults in Past 7 DaysResult Component Current Result BUN 7 (L) (05/24/2023) Calcium 9.8 (05/24/2023) Chloride 114 (05/24/2023) CO2 21 (05/24/2023) Creatinine 0.58 (05/24/2023) eGFR (Creatinine) >60 (05/24/2023) Glucose 86 (05/24/2023) Potassium 3.8 (05/24/2023) Sodium 141 (05/24/2023) Recent Results (from the past 24 hour(s)) Urine microscopic     (BH GH LMW YH)  Collection Time: 05/24/23  4:09 AM Result Value Ref Range  RBC/HPF, UA 122 (H) 0 - 2 /HPF  WBC/HPF, UA 8 (H) 0 - 5 /HPF  Bacteria, UA Rare None-Rare /HPF  Urine Squamous Epithelial Cells, UA <1 0 - 5 /HPF Diagnostics:[place] Abdomen Pelvis w IV Contrast (No Oral)Result Date: 05/24/2023  1. Extensive colonic diverticulosis without [place] evidence for diverticulitis. No definite contrast extravasation is seen to identify a source of bleeding. 2. Nonobstructing right renal calculi. Delayed left nephrogram with a new 5 mm calculus projecting at the level of the left iliac crest highly suspicious for a left ureteral calculus Dr. Alena Bills discussed these findings with [name], PA at 2:56 AM. I discussed these findings with Dr. Rinaldo Ratel at 9:30 AM. Radiology Notify System Classification: Clinical team aware. Individual dose optimization technique was used for the performance of this procedure, and one or more of the following dose reduction techniques was used: Automated exposure control and/or adjustment of the MA and/or kV according to patient size and/or the use of iterative reconstruction technique. Reported and signed by:  Geroge Baseman, MD  A/P:  67 yo female w/ hx of DVT (on Eliquis), RA, anemia, perforated diverticulitis s/p R hemicolectomy/ileostomy, non ambulatory, admitted with 5mm renal stone in the left ureter secondary to anemia of unknown source. Afebrile and asymptomatic. -Continue diet/IVF-Pain control PRN-Strain all urine-Start Flomax-KUB ordered -SCDs, chemical DVT ppx per primary team-Patient seen and plan discussed with Dr. Cecelia Byars, PA-C

## 2023-05-25 LAB — CBC WITHOUT DIFFERENTIAL
BKR WAM ANALYZER ANC: 2.13 x 1000/ÂµL (ref 2.00–7.60)
BKR WAM HEMATOCRIT (2 DEC): 31 % — ABNORMAL LOW (ref 35.00–45.00)
BKR WAM HEMOGLOBIN: 9.4 g/dL — ABNORMAL LOW (ref 11.7–15.5)
BKR WAM MCH (PG): 27.3 pg (ref 27.0–33.0)
BKR WAM MCHC: 30.3 g/dL — ABNORMAL LOW (ref 31.0–36.0)
BKR WAM MCV: 90.1 fL (ref 80.0–100.0)
BKR WAM MPV: 9.9 fL — ABNORMAL HIGH (ref 8.0–12.0)
BKR WAM PLATELETS: 261 x1000/ÂµL (ref 150–420)
BKR WAM RDW-CV: 14 % (ref 11.0–15.0)
BKR WAM RED BLOOD CELL COUNT.: 3.44 M/ÂµL — ABNORMAL LOW (ref 4.00–6.00)
BKR WAM WHITE BLOOD CELL COUNT: 4.3 x1000/ÂµL (ref 4.0–11.0)

## 2023-05-25 LAB — CBC WITH AUTO DIFFERENTIAL
BKR WAM ABSOLUTE IMMATURE GRANULOCYTES.: 0 x 1000/ÂµL (ref 0.00–0.30)
BKR WAM ABSOLUTE LYMPHOCYTE COUNT.: 1.15 x 1000/ÂµL (ref 0.60–3.70)
BKR WAM ABSOLUTE NRBC (2 DEC): 0 x 1000/ÂµL (ref 0.00–1.00)
BKR WAM ANALYZER ANC: 2.28 x 1000/ÂµL (ref 2.00–7.60)
BKR WAM BASOPHIL ABSOLUTE COUNT.: 0.03 x 1000/ÂµL (ref 0.00–1.00)
BKR WAM BASOPHILS: 0.7 % (ref 0.0–1.4)
BKR WAM EOSINOPHIL ABSOLUTE COUNT.: 0.29 x 1000/ÂµL (ref 0.00–1.00)
BKR WAM EOSINOPHILS: 6.9 % — ABNORMAL HIGH (ref 0.0–5.0)
BKR WAM HEMATOCRIT (2 DEC): 28.7 % — ABNORMAL LOW (ref 35.00–45.00)
BKR WAM HEMOGLOBIN: 8.7 g/dL — ABNORMAL LOW (ref 11.7–15.5)
BKR WAM IMMATURE GRANULOCYTES: 0 % (ref 0.0–1.0)
BKR WAM LYMPHOCYTES: 27.3 % (ref 17.0–50.0)
BKR WAM MCH (PG): 27.3 pg (ref 27.0–33.0)
BKR WAM MCHC: 30.3 g/dL — ABNORMAL LOW (ref 31.0–36.0)
BKR WAM MCV: 90 fL (ref 80.0–100.0)
BKR WAM MONOCYTE ABSOLUTE COUNT.: 0.47 x 1000/ÂµL (ref 0.00–1.00)
BKR WAM MONOCYTES: 11.1 % (ref 4.0–12.0)
BKR WAM MPV: 10.4 fL (ref 8.0–12.0)
BKR WAM NEUTROPHILS: 54 % — ABNORMAL LOW (ref 39.0–72.0)
BKR WAM NUCLEATED RED BLOOD CELLS: 0 % (ref 0.0–1.0)
BKR WAM PLATELETS: 258 x1000/ÂµL (ref 150–420)
BKR WAM RDW-CV: 13.9 % (ref 11.0–15.0)
BKR WAM RED BLOOD CELL COUNT.: 3.19 M/ÂµL — ABNORMAL LOW (ref 4.00–6.00)
BKR WAM WHITE BLOOD CELL COUNT: 4.2 x1000/ÂµL (ref 4.0–11.0)

## 2023-05-25 LAB — BASIC METABOLIC PANEL
BKR ANION GAP: 5 (ref 5–18)
BKR BLOOD UREA NITROGEN: 8 mg/dL (ref 8–25)
BKR BUN / CREAT RATIO: 12.1 (ref 8.0–25.0)
BKR CALCIUM: 9.4 mg/dL (ref 8.4–10.3)
BKR CHLORIDE: 111 mmol/L (ref 95–115)
BKR CO2: 23 mmol/L (ref 21–32)
BKR CREATININE: 0.66 mg/dL (ref 0.50–1.30)
BKR EGFR, CREATININE (CKD-EPI 2021): >60 mL/min/{1.73_m2} (ref >=60)
BKR GLUCOSE: 262 mg/dL — ABNORMAL HIGH (ref 70–100)
BKR OSMOLALITY CALCULATION: 285 mosm/kg (ref 275–295)
BKR POTASSIUM: 3.9 mmol/L (ref 3.5–5.1)
BKR SODIUM: 139 mmol/L — ABNORMAL LOW (ref 136–145)

## 2023-05-25 LAB — URINE CULTURE: BKR URINE CULTURE, ROUTINE: NO GROWTH

## 2023-05-25 MED ORDER — LACTATED RINGERS INTRAVENOUS SOLUTION
INTRAVENOUS | Status: DC
Start: 2023-05-25 — End: 2023-05-27
  Administered 2023-05-25 – 2023-05-26 (×2): 1000.000 mL/h via INTRAVENOUS

## 2023-05-25 MED ORDER — IRON DEXTRAN IVPB
Freq: Once | INTRAVENOUS | Status: CP
Start: 2023-05-25 — End: ?
  Administered 2023-05-25: 18:00:00 500.000 mL/h via INTRAVENOUS

## 2023-05-25 MED ORDER — EPINEPHRINE 0.3 MG/0.3 ML INJECTION, AUTO-INJECTOR
0.30.3 mg/ mL | INTRAMUSCULAR | Status: DC | PRN
Start: 2023-05-25 — End: 2023-05-27

## 2023-05-25 MED ORDER — FOLIC ACID 1 MG TABLET
1 mg | Freq: Every day | ORAL | Status: DC
Start: 2023-05-25 — End: 2023-05-27
  Administered 2023-05-25 – 2023-05-26 (×2): 1 mg via ORAL

## 2023-05-25 MED ORDER — ALBUTEROL SULFATE 2.5 MG/3 ML (0.083 %) SOLUTION FOR NEBULIZATION
2.530.083 mg /3 mL (0.083 %) | RESPIRATORY_TRACT | Status: DC | PRN
Start: 2023-05-25 — End: 2023-05-27

## 2023-05-25 MED ORDER — HYDROCORTISONE SODIUM SUCCINATE 100 MG SOLUTION FOR INJECTION
100 mg | Freq: Once | INTRAVENOUS | Status: CP
Start: 2023-05-25 — End: ?
  Administered 2023-05-25: 18:00:00 100 mL via INTRAVENOUS

## 2023-05-25 MED ORDER — SODIUM CHLORIDE 0.9 % BOLUS (NEW BAG)
0.9 % | Freq: Once | INTRAVENOUS | Status: CP | PRN
Start: 2023-05-25 — End: ?
  Administered 2023-05-25: 18:00:00 0.9 mL/h via INTRAVENOUS

## 2023-05-25 NOTE — Plan of Care
Plan of Care Overview/ Patient Status    Assumed care of pt 7:00, received AOx4, VSS, denies painPurewick in place, appetite good, Illeostomy intact/patentPO meds whole, appetite good, total care given, LR@75mlSafety  precautions, call bell in reach, [facility]

## 2023-05-25 NOTE — Plan of Care
Plan of Care Overview/ Patient Status    Spoke with MD, patient not medically stable for discharge today. Dirk Dress BSN, RNNurse Case ManagerYale New Clermont Ambulatory Surgical Center

## 2023-05-25 NOTE — Utilization Review (ED)
UM Status: Physician Advisor Approves Observation Status Sent from facility with blood in ostomy bag, imaging reviewed, Hgb 9.7-8.7-9.4 this am, Urology consulting, IVF, urine culture pendingADD: message from Dr. Alinda Money, plan for IV iron and starting eliquis todayADD: Physician Advisor Dr. Doris Cheadle approves continued observation, Dr. Demetrius Charity. Teslya did not Doctor, hospital

## 2023-05-25 NOTE — Plan of Care
Problem: Adult Inpatient Plan of CareGoal: Plan of Care ReviewOutcome: Interventions implemented as appropriateGoal: Patient-Specific Goal (Individualized)Outcome: Interventions implemented as appropriateGoal: Optimal Comfort and WellbeingOutcome: Interventions implemented as appropriate Problem: Skin Injury Risk IncreasedGoal: Skin Health and IntegrityOutcome: Interventions implemented as appropriate Plan of Care Overview/ Patient Status    Pt Aox4, RA VSS, No complaint pain/discomfort. Due meds per MAR. On RTM w/ pulse ox,  + purewick, ostomy care w/ dark brown stool noted.  Total care, Call bell w/in reach

## 2023-05-25 NOTE — Progress Notes
Medicine Inpatient Progress NoteAttending Provider: Rinaldo Ratel, MDCC: stool with blood, hematuria, kidney stone, iron deficiency anemia Subjective: The patient feels weak. Denies back pain, sob, nausea or vomiting. Reported that stool in her ileostomy bag is normal green/brown color, no new bleeding. Afebrile. Meds: Scheduled Meds:Current Facility-Administered Medications Medication Dose Route Frequency Provider Last Rate Last Admin  apixaban (ELIQUIS) tablet 5 mg  5 mg Per G Tube Q12H Mercer Pod, MD      ferrous gluconate (FERGON) tablet 324 mg  324 mg Oral Every Other Day Rinaldo Ratel, MD   324 mg at 05/24/23 1400  gabapentin (NEURONTIN) capsule 400 mg  400 mg Oral BID WC Mercer Pod, MD   400 mg at 05/25/23 0945  gabapentin (NEURONTIN) tablet 600 mg  600 mg Oral Nightly Mercer Pod, MD   600 mg at 05/24/23 2014  hydrocortisone sodium succinate (Solu-CORTEF) injection 100 mg  100 mg IV Push Once Rinaldo Ratel, MD      hydroxychloroquine (PLAQUENIL) tablet 200 mg  200 mg Oral Daily Mercer Pod, MD   200 mg at 05/25/23 0945  iron dextran (INFED) 1,000 mg in sodium chloride 0.9% 500 mL IVPB  1,000 mg Intravenous Once Rinaldo Ratel, MD      pantoprazole (PROTONIX) EC tablet 40 mg  40 mg Oral Daily Rinaldo Ratel, MD   40 mg at 05/25/23 0945  rosuvastatin (CRESTOR) tablet 10 mg  10 mg Per G Tube Nightly Mercer Pod, MD   10 mg at 05/24/23 2011  sodium chloride 0.9 % flush 3 mL  3 mL IV Push Q8H Mercer Pod, MD   3 mL at 05/24/23 2108  tamsulosin (FLOMAX) 24 hr capsule 0.4 mg  0.4 mg Oral Nightly Rinaldo Ratel, MD   0.4 mg at 05/24/23 2011  traMADoL (ULTRAM) tablet 50 mg  50 mg Oral Daily Mercer Pod, MD   50 mg at 05/25/23 0945 Continuous Infusions: lactated ringers   PRN Meds:acetaminophen, albuterol, EPINEPHrine, EPINEPHrine, melatonin, sodium chloride 0.9 % (new bag), sodium chloride, traMADoLObjective: Vitals:Temp:  [97.5 ?F (36.4 ?C)-99.3 ?F (37.4 ?C)] 97.8 ?F (36.6 ?C)Pulse:  [52-70] 57Resp:  [17-18] 18BP: (86-102)/(52-66) 95/59SpO2:  [94 %-99 %] 97 %Device (Oxygen Therapy): room air  I/O's:Intake/Output Summary (Last 24 hours) at 05/25/2023 1310Last data filed at 05/25/2023 0900Gross per 24 hour Intake 450 ml Output 1500 ml Net -1050 ml  Physical Exam:General/consitutional - Appropriate to age and weight, NADHEENT - NCAT, no lymphadenopathy supraclavicular, cervical, submandibular, submentalCV- S1S2 RRR, no RMG Pulmonary  - CTA B/LGI - Abdomen soft NT ND, stoma in RLQ with green solid stool. No blood, no dark stool. Gastrostomy covered by clean dressing Vascular - no edema pulse 2+ DP/PTMusculoskeletal no reproducible tenderness Derm: normal colorHem/once no palpable lymph nodes, no petechia Neuro: AOx3, but forgetful,  normal muscle strength on all extremities, no sensitivity deficit. Labs: Recent Labs Lab 06/02/240004 06/02/240658 06/02/240659 06/02/241744 06/03/240647 06/03/240814 WBC 4.6  --  4.7  --  4.2 4.3 HGB 9.7*  --  9.6*   < > 8.7* 9.4* HCT 31.30*  --  32.90*   < > 28.70* 31.00* PLT 295  --  273  --  258 261 NEUTROPHILS 43.5  --  46.7  --  54.0  --  NA 142 141  --   --  139  --  K 3.8 3.8  --   --  3.9  --  CL 111 114  --   --  111  --  CO2 25 21  --   --  23  --  BUN 10 7*  --   --  8  --  CREATININE 0.72 0.58  --   --  0.66  --  ANIONGAP 6 6  --   --  5  --   < > = values in this interval not displayed.  Recent Labs Lab 06/02/240004 06/02/240225 06/02/240658 06/03/240647 ALT 12  --   --   --  AST 13  --   --   --  ALKPHOS 71  --   --   --  BILITOT 0.1  --   --   --  PROT 7.3  --   --   --  ALBUMIN 3.1*  --   --   --  CALCIUM 10.0  --  9.8 9.4 MG 1.8  --   --   --  PHOS 3.0  --   --   --  PTT  --  27.6  --   --  LABPROT  --  11.3 --   --  INR  --  1.07  --   --   Recent Labs Lab 06/02/240004 06/02/240658 06/03/240647 GLU 92 86 262* Micro:Urine culture 05/24/2023 pending Diagnostics:I independently reviewed the images or tracings within the last 48 hrs. Significant abnormals are below.CARDIAC MISC.  RESULT SCAN Final Result  XR Abdomen AP Final Result  US Duplex Lower Extremity Venous Bilat Final Result  [place] Abdomen Pelvis w IV Contrast (No Oral) Final Result    1. Extensive colonic diverticulosis without [place] evidence for diverticulitis. No definite contrast extravasation is seen to identify a source of bleeding. 2. Nonobstructing right renal calculi. Delayed left nephrogram with a new 5 mm calculus projecting at the level of the left iliac crest highly suspicious for a left ureteral calculus  Dr. Alena Bills discussed these findings with Danielle Constant, PA at 2:56 AM. I discussed these findings with Dr. Rinaldo Ratel at 9:30 AM.  Radiology Notify System Classification: Clinical team aware.   Individual dose optimization technique was used for the performance of this procedure, and one or more of the following dose reduction techniques was used: Automated exposure control and/or adjustment of the MA and/or kV according to patient size and/or the use of iterative reconstruction technique.  Reported and signed by:  Geroge Baseman, MD   CARDIAC MISC.  RESULT SCAN Final Result  CARDIAC MISC.  RESULT SCAN Final Result  Results for orders placed or performed during the hospital encounter of 05/23/23 EKG Result Value Ref Range  Heart Rate 54 bpm  QRS Interval 86 ms  QT Interval 460 ms  QTC Interval 436 ms  P Axis 47 deg  QRS Axis -13 deg  T Wave Axis 53 deg  P-R Interval 172 msec  SEVERITY Otherwise Normal ECG severity Patient Active Problem List Diagnosis  Fever, unspecified fever cause  Iron deficiency anemia Gastrointestinal hemorrhage, unspecified gastrointestinal hemorrhage type Assessment & Plan: 67 y.o. 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  Patient was found to have a stone in left ureter.  Her apixaban was held on admission for blood in the ileostomy bag.  Patient is iron-deficiency with anemia.PlanGI bleed from ileostomyChronic iron-deficiency anemiaChronic DVTCoagulopathy secondary to apixabanProtonix 40 mg dailyResume apixaban tonightUltrasound duplex showed persistent clot in lower extremitiesMonitor H&H in a.m.Monitor for signs of  active bleeding in the stomaLR75 cc/hrIron-deficiency anemiaDiscussed with Hematology Dr. Evelena Peat iron dextran 1000 mg with premedications with hydrocortisone IV 100 mgFolic acidHematuriaLeft ureteral stoneAppreciate urology inputFlomaxIV fluidsFollow-up urine cultureRANeuropathyHydroxychloroquine 200 mg dailyTramadol p.r.n.Gabapentin 400 mg b.i.d. and 600 mg nightlyHyperlipidemiaCrestor 10 mg daily## Diet:  Regular## Proph:  On full anticoagulation with apixaban## Code:  Full codeSigned:Markeese Boyajian MDPager [id number] 3, 20241:10 PMThe above report was partially generated using voice recognition software. It may contain grammatical,syntax and spelling errors. Please contact the hospitalist office for questions or clarification

## 2023-05-26 DIAGNOSIS — G629 Polyneuropathy, unspecified: Secondary | ICD-10-CM

## 2023-05-26 DIAGNOSIS — Z79899 Other long term (current) drug therapy: Secondary | ICD-10-CM

## 2023-05-26 DIAGNOSIS — D689 Coagulation defect, unspecified: Secondary | ICD-10-CM

## 2023-05-26 DIAGNOSIS — Z7901 Long term (current) use of anticoagulants: Secondary | ICD-10-CM

## 2023-05-26 DIAGNOSIS — D500 Iron deficiency anemia secondary to blood loss (chronic): Secondary | ICD-10-CM

## 2023-05-26 DIAGNOSIS — G8220 Paraplegia, unspecified: Secondary | ICD-10-CM

## 2023-05-26 DIAGNOSIS — N202 Calculus of kidney with calculus of ureter: Secondary | ICD-10-CM

## 2023-05-26 DIAGNOSIS — I82509 Chronic embolism and thrombosis of unspecified deep veins of unspecified lower extremity: Secondary | ICD-10-CM

## 2023-05-26 DIAGNOSIS — R319 Hematuria, unspecified: Secondary | ICD-10-CM

## 2023-05-26 DIAGNOSIS — M069 Rheumatoid arthritis, unspecified: Secondary | ICD-10-CM

## 2023-05-26 DIAGNOSIS — Z79891 Long term (current) use of opiate analgesic: Secondary | ICD-10-CM

## 2023-05-26 DIAGNOSIS — E785 Hyperlipidemia, unspecified: Secondary | ICD-10-CM

## 2023-05-26 DIAGNOSIS — Z9049 Acquired absence of other specified parts of digestive tract: Secondary | ICD-10-CM

## 2023-05-26 DIAGNOSIS — K9411 Enterostomy hemorrhage: Secondary | ICD-10-CM

## 2023-05-26 LAB — CBC WITH AUTO DIFFERENTIAL
BKR WAM ABSOLUTE IMMATURE GRANULOCYTES.: 0.01 x 1000/ÂµL (ref 0.00–0.30)
BKR WAM ABSOLUTE LYMPHOCYTE COUNT.: 1.84 x 1000/ÂµL (ref 0.60–3.70)
BKR WAM ABSOLUTE NRBC (2 DEC): 0 x 1000/ÂµL (ref 0.00–1.00)
BKR WAM ANALYZER ANC: 1.96 x 1000/ÂµL — ABNORMAL LOW (ref 2.00–7.60)
BKR WAM BASOPHIL ABSOLUTE COUNT.: 0.03 x 1000/ÂµL (ref 0.00–1.00)
BKR WAM BASOPHILS: 0.7 % (ref 0.0–1.4)
BKR WAM EOSINOPHIL ABSOLUTE COUNT.: 0.12 x 1000/ÂµL (ref 0.00–1.00)
BKR WAM EOSINOPHILS: 2.8 % (ref 0.0–5.0)
BKR WAM HEMATOCRIT (2 DEC): 31.2 % — ABNORMAL LOW (ref 35.00–45.00)
BKR WAM HEMOGLOBIN: 9.6 g/dL — ABNORMAL LOW (ref 11.7–15.5)
BKR WAM IMMATURE GRANULOCYTES: 0.2 % (ref 0.0–1.0)
BKR WAM LYMPHOCYTES: 42.3 % (ref 17.0–50.0)
BKR WAM MCH (PG): 27.7 pg (ref 27.0–33.0)
BKR WAM MCHC: 30.8 g/dL — ABNORMAL LOW (ref 31.0–36.0)
BKR WAM MCV: 89.9 fL (ref 80.0–100.0)
BKR WAM MONOCYTE ABSOLUTE COUNT.: 0.39 x 1000/ÂµL (ref 0.00–1.00)
BKR WAM MONOCYTES: 9 % (ref 4.0–12.0)
BKR WAM MPV: 10.3 fL (ref 8.0–12.0)
BKR WAM NEUTROPHILS: 45 % (ref 39.0–72.0)
BKR WAM NUCLEATED RED BLOOD CELLS: 0 % (ref 0.0–1.0)
BKR WAM PLATELETS: 287 x1000/ÂµL (ref 150–420)
BKR WAM RDW-CV: 13.9 % (ref 11.0–15.0)
BKR WAM RED BLOOD CELL COUNT.: 3.47 M/ÂµL — ABNORMAL LOW (ref 4.00–6.00)
BKR WAM WHITE BLOOD CELL COUNT: 4.4 x1000/ÂµL (ref 4.0–11.0)

## 2023-05-26 LAB — MAGNESIUM: BKR MAGNESIUM: 2.2 mg/dL — ABNORMAL LOW (ref 1.4–2.2)

## 2023-05-26 LAB — BASIC METABOLIC PANEL
BKR ANION GAP: 4 g/dL — ABNORMAL LOW (ref 5–18)
BKR BLOOD UREA NITROGEN: 8 mg/dL (ref 8–25)
BKR BUN / CREAT RATIO: 15.1 % (ref 8.0–25.0)
BKR CALCIUM: 10.4 mg/dL — ABNORMAL HIGH (ref 8.4–10.3)
BKR CHLORIDE: 115 mmol/L (ref 95–115)
BKR CO2: 24 mmol/L (ref 21–32)
BKR CREATININE: 0.53 mg/dL (ref 0.50–1.30)
BKR EGFR, CREATININE (CKD-EPI 2021): >60 mL/min/{1.73_m2} (ref >=60–5.0)
BKR GLUCOSE: 98 mg/dL (ref 70–100)
BKR OSMOLALITY CALCULATION: 283 mOsm/kg — ABNORMAL LOW (ref 275–295)
BKR POTASSIUM: 3.5 mmol/L — ABNORMAL LOW (ref 3.5–5.1)
BKR SODIUM: 143 mmol/L — ABNORMAL LOW (ref 136–145)

## 2023-05-26 LAB — HEPATIC FUNCTION PANEL
BKR A/G RATIO: 0.5
BKR ALANINE AMINOTRANSFERASE (ALT): 7 U/L — ABNORMAL LOW (ref 12–78)
BKR ALBUMIN: 2.8 g/dL — ABNORMAL LOW (ref 3.4–5.0)
BKR ALKALINE PHOSPHATASE: 68 U/L (ref 20–120)
BKR ASPARTATE AMINOTRANSFERASE (AST): 5 U/L (ref 5–37)
BKR AST/ALT RATIO: 0.7
BKR BILIRUBIN DIRECT: <0.1 mg/dL (ref 0.0–0.3)
BKR BILIRUBIN TOTAL: 0.1 mg/dL (ref 0.0–1.0)
BKR BILIRUBIN, INDIRECT: >0 mg/dL
BKR GLOBULIN: 5.2 g/dL
BKR PROTEIN TOTAL: 8 g/dL (ref 6.4–8.2)

## 2023-05-26 LAB — PHOSPHORUS     (BH GH L LMW YH): BKR PHOSPHORUS: 2.3 mg/dL — ABNORMAL LOW (ref 2.5–4.9)

## 2023-05-26 MED ORDER — POTASSIUM BICARBONATE-CITRIC ACID 25 MEQ EFFERVESCENT TABLET
25 mEq | Freq: Once | ORAL | Status: CP
Start: 2023-05-26 — End: ?
  Administered 2023-05-26: 13:00:00 25 mEq via ORAL

## 2023-05-26 MED ORDER — PANTOPRAZOLE 40 MG TABLET,DELAYED RELEASE
40 mg | ORAL_TABLET | Freq: Every day | ORAL | 12 refills | Status: AC
Start: 2023-05-26 — End: ?

## 2023-05-26 MED ORDER — FOLIC ACID 1 MG TABLET
1 mg | ORAL_TABLET | Freq: Every day | ORAL | 12 refills | Status: AC
Start: 2023-05-26 — End: ?

## 2023-05-26 MED ORDER — TAMSULOSIN 0.4 MG CAPSULE
0.4 mg | ORAL_CAPSULE | Freq: Every evening | ORAL | 1 refills | Status: AC
Start: 2023-05-26 — End: ?

## 2023-05-26 NOTE — Plan of Care
Plan of Care Overview/ Patient Status    Danielle [name] was discharged via Ambulance accompanied by No One.  Verbalized understanding of discharge instructionsand recommended follow up care as per the after visit summary.  Written discharge instructions provided. Denies any further questions. Vital signs    Vitals:  05/25/23 1932 05/26/23 0546 05/26/23 0739 05/26/23 1347 BP: 112/66 125/74 119/73 111/82 Pulse: 76 (!) 54 (!) 52 68 Resp: 18 18 20 20  Temp: 98.2 ?F (36.8 ?C) 98.8 ?F (37.1 ?C) 97.3 ?F (36.3 ?C) 97.8 ?F (36.6 ?C) TempSrc: Oral Oral Oral Oral SpO2: 97% 97% 99% (!) 90% Weight:     Height:     Patient confirmed all belongings returned. Belongings charted in last 7 days:

## 2023-05-26 NOTE — Plan of Care
Plan of Care Overview/ Patient Status    Pt Alert and oriented x4. VSS on room air. No sob, pain and discomfort noted.. All due meds given per MAR. Total care provided. Ostomy care done, greenish brown stool noted. Pure wick in placed. Safety/fall precautions maintained. Call bell in reached.

## 2023-05-26 NOTE — Care Coordination-Inpatient
TC updated Conservator Jacquenette Shone [phone] and patient for discharge today to [facility]Made Transportation arrangements with BA to [facility] [place] at 11;30am via stretcherCharlacia [facility]

## 2023-05-26 NOTE — Utilization Review (ED)
UM Status: UR Reviewed-Continue Observation Services D/c noted in EMR

## 2023-05-26 NOTE — Other
[facility]	 	Hematology/Oncology Consult Note Consult Information: Consultation requested by: Attending Provider: Rinaldo Ratel, MD [phone] for consultation:  DVT right popliteal vein; history of bilateral femoral vein DVT; anemia and iron deficiencySource of Information: Patient and EMR/Previous RecordPresentation History: She has a lengthy and complicated medical history.For detailed medical history prior to 2023, review the admission note from 02/11/2022.In November 2022, she was admitted to a hospital in [place] with hemoperitoneum, due to a diverticular perforation.  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  She also developed encephalopathy.  She was eventually transferred to a skilled nursing facility on 02/05/2022.  Thereafter, patient was brought to [facility] on 02/11/2022 for management of encephalopathy.  She had a six-month hospitalization.  She was discharged to another skilled nursing care facility on 08/14/2022 with instructions to follow-up with the hematologist about the duration of anticoagulation therapy.She was then admitted to [facility] in March of 2024 again with altered mental status secondary to sepsis.  She was treated for UTI/sepsis and discharged back to skilled nursing care facility within a week.On 05/24/2023, she presented to [facility] with blood from the stoma.Doppler ultrasound on 05/24/2023 showed that she have a new partially occlusive thrombus in the right popliteal vein.Blood test on 05/24/2023 was consistent with iron deficiencyHemoglobin was reduced at 8.7She received 1 g of iron dextran on 06/03/2024Patient reported that she tolerated the IV ironShe is a bit apprehensive about returning back to the skilled nursing care facilityShe is worried that the bleeding will recurReview of Allergies/Medical History/Medications: PAST MEDICAL HISTORY:Rheumatoid arthritisDiverticular perforation with abscess-November 2022Recurrent sepsisAnemia/iron deficiencyMEDICATIONS:Current Facility-Administered Medications Medication  acetaminophen (TYLENOL) tablet 650 mg  albuterol neb sol 2.5 mg/3 mL (0.083%) (PROVENTIL,VENTOLIN)  apixaban (ELIQUIS) tablet 5 mg  EPINEPHrine (EPI-PEN) auto-injector 0.3 mg  EPINEPHrine (EPI-PEN) auto-injector 0.3 mg  ferrous gluconate (FERGON) tablet 324 mg  folic acid (FOLVITE) tablet 1 mg  gabapentin (NEURONTIN) capsule 400 mg  gabapentin (NEURONTIN) tablet 600 mg  hydroxychloroquine (PLAQUENIL) tablet 200 mg  lactated ringers infusion  melatonin tablet 3 mg  pantoprazole (PROTONIX) EC tablet 40 mg  rosuvastatin (CRESTOR) tablet 10 mg  sodium chloride 0.9 % flush 3 mL  sodium chloride 0.9 % flush 3 mL  tamsulosin (FLOMAX) 24 hr capsule 0.4 mg  traMADoL (ULTRAM) tablet 50 mg  traMADoL (ULTRAM) tablet 50 mg  SOCIAL HISTORY:Never smokedNever consumed alcohol regularlyFAMILY HISTORY:No known family history of malignanciesALLERGIES:No Known Allergies Review of Systems: Review of SystemsPhysical Exam: BP 119/73  - Pulse (!) 52  - Temp 97.3 ?F (36.3 ?C) (Oral)  - Resp 20  - Ht 5' 10 (1.778 m)  - Wt 62.2 kg  - SpO2 99%  - BMI 19.68 kg/m? Physical ExamReview of Labs/Diagnostics: Labs:[place]:MRI:US: Impression / Recommendations: History of DVT?  New right popliteal DVT-06/02/2024Anemia due to blood lossHer medical records were reviewedShe had a complicated medical historyHer hematologic history is only little bit less complicatedHer anemia is in part due to inadequate ironShe received 1 g of iron dextran which should correct her iron deficitIt was unclear to me whether the DVT on 05/24/2023 is accurateThe thrombus is reported to be partial occlusionThis may be due to poor technique rather than a true thrombusRegardless, given her medical history and lack of mobility, I favor that she remain on the anticoagulant therapy-perhaps indefinitelyIt was likely that she will be at future risk for recurrent infections and her mobility is not likely to improve in the very near future.  Even with a history of blood in the stoma, I feel that the benefit of the anticoagulant therapy outweigh the risksFor now, I recommend that she remain on the current anticoagulation regimenShe is likely to return to SNF soon.My office will contact her and schedule a follow-up blood test and visit in 2-3 weeksPlease note: MModal speech recognition software was used to compose this note. Notes are reviewed for accuracy but unintended translational errors can occur. Please contact me if there are any questions about the content of this note.Signed: Isaias Cowman, MD For questions please page: 12926/03/2023

## 2023-05-26 NOTE — Discharge Instructions
Please follow up with Dr. Hulen Luster urology for left ureteral stone, continue  to take flomax for now until you see Dr. Hulen Luster Follow up with Dr. Nedra Hai, hematologist in 2-4 weeks for iron infusions and follow up Continue to take apixaban 5 mg twice a day Take oral iron supplement daily

## 2023-05-26 NOTE — Discharge Summary
[facility] Dis[facility] SummaryPatient Data:  Patient Name: Danielle [name] Admit date: 05/23/2023 Age: 67 y.o. Discharge date: 05/26/2023 DOB: [birth year]	 Discharge Attending Physician: Rinaldo Ratel, MD  MRN: [id number]	 Discharged Condition: stable PCP: Kara Mead, Assunta Gambles, MD Disposition: Skilled Nursing Facility for Long Term Care. Call made to SNF Provider by Care Team;  Yes Principal Diagnosis: hematuria, left ureteral stone Comorbidities HematocheziaIron deficiency anemia Comorbidities present on admission:Patient has moderate malnutrition based on BMI (Calculated): 19.7 and age. Coagulopathic due to prescription of anticoagulant medication prior to admission. 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 Hulen Luster urology for left ureteral stone, continue  to take flomax for now until you see Dr. Hulen Luster Follow up with Dr. Nedra Hai, hematologist in 2-4 weeks for iron infusions and follow up Continue to take apixaban 5 mg twice a day Take oral iron supplement daily Relevant Medications on Discharge:New Medications: flomax .Pending Labs and Tests: Follow-up Information:Brenes Dorso, Assunta Gambles, MD[address] [phone] an appointment as soon as possible for a visitAs neededNATHANIEL [facility] [phone], Arlie Solomons, MD[address] [phone] an appointment as soon as possible for a visit in 1 month(s)for follow up and iron infusionsStroumbakis, Janyth Pupa, MD[address] [phone] an appointment as soon as possible for a visit in 2 week(s)to follow up for left ureteral stone Future Appointments Date Time Provider Department Center 06/03/2023 11:30 AM Raynelle Highland, MD [facility] 10/08/2023  9:45 AM [facility] [place] [facility] 10/08/2023 10:00 AM Cornell, Lise Auer, APRN [facility] 01/21/2024 11:30 AM [name], MD Endo None Hospital Course: Hospital Course: 67 y.o. 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  Patient was found to have a stone in left ureter.  Her apixaban was held on admission for blood in the ileostomy bag.  Patient is with  iron-deficiency with anemia.  Neurology consulted.  Recommended conservative treatment with Flomax and hydration.  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  Apixaban resume.  Patient had no obvious bleeding.  Hematology consulted.  Patient received IV iron infusion while in the hospital.  Patient discharged home stable condition with recommendation to follow-up with urology in 2 weeks, hematology in 2-4 weeks for iron infusion.  Her conservator, Danielle [name] was updated by case manager team regarding discharge.Inpatient Consultants and summary of recommendations:UrologyHematology A/P:  67 yo female w/ hx of DVT (on Eliquis), RA, anemia, perforated diverticulitis s/p R hemicolectomy/ileostomy, non ambulatory, admitted with 5mm renal stone in the left ureter secondary to anemia of unknown source. Afebrile and asymptomatic.  -Continue diet/IVF-Pain control PRN-Strain all urine-Start Flomax-KUB ordered -SCDs, chemical DVT ppx per primary team-Patient seen and plan discussed with Dr. Gerrit Halls, PA-C      Revision History Pertinent Procedures or Surgeries: None Pertinent lab findings and test results: Objective: Recent Labs Lab 06/03/240647 06/03/240814 06/04/240639 WBC 4.2 4.3 4.4 HGB 8.7* 9.4* 9.6* HCT 28.70* 31.00* 31.20* PLT 258 261 287  Recent Labs Lab 06/02/240659 06/03/240647 06/04/240639 NEUTROPHILS 46.7 54.0 45.0  Recent Labs Lab 06/02/240658 06/03/240647 06/04/240639 NA 141 139 143 K  3.8 3.9 3.5 CL 114 111 115 CO2 21 23 24  BUN 7* 8 8 CREATININE 0.58 0.66 0.53 GLU 86 262* 98 ANIONGAP 6 5 4*  Recent Labs Lab 06/02/240658 06/03/240647 06/04/240639 CALCIUM 9.8 9.4 10.4* MG  --   --  2.2 PHOS  --   --  2.3*  Recent Labs Lab 06/02/240004 06/04/240639 ALT 12 7* AST 13 5 ALKPHOS 71 68 BILITOT 0.1 0.1 BILIDIR  --  <0.1  Recent Labs Lab 06/02/240225 PTT 27.6 LABPROT 11.3 INR 1.07  Culture Information:Recent Labs Lab 06/02/240409 LABURIN No Growth Imaging: Imaging results last 1 week:  [place] Abdomen Pelvis w IV Contrast (No Oral)Result Date: 05/24/2023  1. Extensive colonic diverticulosis without  evidence for diverticulitis. No definite contrast extravasation is seen to identify a source of bleeding. 2. Nonobstructing right renal calculi. Delayed left nephrogram with a new 5 mm calculus projecting at the level of the left iliac crest highly suspicious for a left ureteral calculus Dr. Alena Bills discussed these findings with [name], PA at 2:56 AM. I discussed these findings with Dr. Rinaldo Ratel at 9:30 AM. Radiology Notify System Classification: Clinical team aware. Individual dose optimization technique was used for the performance of this procedure, and one or more of the following dose reduction techniques was used: Automated exposure control and/or adjustment of the MA and/or kV according to patient size and/or the use of iterative reconstruction technique. Reported and signed by:  Geroge Baseman, MD  Diet:  Heart healthy dietMobility: Highest Level of mobility - ACTUAL: Mobility Level 2, Turn self in bed/bed activity/dependent transfer, AM PAC 6-7  Physical Exam Discharge vitals: Temp:  [97.3 ?F (36.3 ?C)-98.8 ?F (37.1 ?C)] 97.8 ?F (36.6 ?C)Pulse:  [52-76] 68Resp:  [18-20] 20BP: (111-125)/(66-82) 111/82SpO2:  [90 %-99 %] 90 %Device (Oxygen Therapy): room air Pertinent Findings of Physical Exam: UnremarkableCognitive Status at Discharge: Alert and Oriented x 3Discharge Physical Exam:Physical ExamVitals and nursing note reviewed. Constitutional:     General: She is not in acute distress.   Appearance: Normal appearance. She is not ill-appearing, toxic-appearing or diaphoretic. HENT:    Head: Normocephalic and atraumatic.    Right Ear: External ear normal. There is no impacted cerumen.    Left Ear: External ear normal. There is no impacted cerumen.    Nose: Nose normal. No congestion or rhinorrhea.    Mouth/Throat:    Mouth: Mucous membranes are dry.    Pharynx: Oropharynx is clear. No oropharyngeal exudate or posterior oropharyngeal erythema. Eyes:    Extraocular Movements: Extraocular movements intact.    Conjunctiva/sclera: Conjunctivae normal.    Pupils: Pupils are equal, round, and reactive to light. Neck:    Vascular: No carotid bruit. Cardiovascular:    Rate and Rhythm: Normal rate and regular rhythm.    Pulses: Normal pulses.    Heart sounds: Normal heart sounds. No murmur heard.   No friction rub. No gallop. Pulmonary:    Effort: Pulmonary effort is normal. No respiratory distress.    Breath sounds: Normal breath sounds. No stridor. No wheezing, rhonchi or rales. Chest:    Chest wall: No tenderness. Abdominal: General: Abdomen is flat. Bowel sounds are normal. There is no distension.    Palpations: Abdomen is soft. There is no mass.    Tenderness: There is no abdominal tenderness. There is no right CVA tenderness, left CVA tenderness, guarding or rebound.    Hernia: No hernia is present.    Comments: Stoma present in right lower quadrant.  No signs of bleeding.  Gastro-cutaneous  fistula present.  No output. Musculoskeletal:       General: No swelling, tenderness, deformity or signs of injury. Normal range of motion.    Cervical back: Normal range of motion and neck supple. No rigidity or tenderness.    Right lower leg: No edema.    Left lower leg: No edema. Lymphadenopathy:    Cervical: No cervical adenopathy. Skin:   General: Skin is warm and dry.    Capillary Refill: Capillary refill takes less than 2 seconds.    Coloration: Skin is not jaundiced or pale.    Findings: No bruising, erythema, lesion or rash. Neurological:    General: No focal deficit present.    Mental Status: She is alert and oriented to person, place, and time.    Cranial Nerves: No cranial nerve deficit.    Sensory: No sensory deficit.    Motor: No weakness.    Gait: Gait abnormal.    Comments: Forgetful Psychiatric:       Mood and Affect: Mood normal.       Behavior: Behavior normal.       Thought Content: Thought content normal.       Judgment: Judgment normal. Allergies No Known Allergies PMH PSH Past Medical History: Diagnosis Date  Acute embolism and thrombosis of unspecified femoral vein (HC Code)   Anemia   Cellulitis and abscess of right lower extremity   Dysphagia   Encephalopathy   GERD (gastroesophageal reflux disease)   Hypercalcemia   Paraplegia (HC Code)   Rheumatoid arthritis (HC Code)   Past Surgical History: Procedure Laterality Date  BAND HEMORRHOIDECTOMY    CYSTECTOMY  1972  LAPAROSCOPIC SALPINGOOPHERECTOMY  1986  LAPAROTOMY 2022  PARTIAL COLECTOMY  2022  PEG TUBE PLACEMENT  01/2022  UPPER GASTROINTESTINAL ENDOSCOPY    Social History Family History Social History Tobacco Use  Smoking status: Never  Smokeless tobacco: Never Substance Use Topics  Alcohol use: Not on file  No family history on file. Discharge Medications: Discharge: Current Discharge Medication List  START taking these medications  Details folic acid (FOLVITE) 1 mg tablet Take 1 tablet (1 mg total) by mouth daily.Qty: 30 tablet, Refills: 11Start date: 05/27/2023  pantoprazole (PROTONIX) 40 mg tablet Take 1 tablet (40 mg total) by mouth daily.Qty: 30 tablet, Refills: 11Start date: 05/27/2023  tamsulosin (FLOMAX) 0.4 mg 24 hr capsule Take 1 capsule (0.4 mg total) by mouth nightly.Qty: 30 capsule, Refills: 0Start date: 05/26/2023   CONTINUE these medications which have NOT CHANGED  Details apixaban (ELIQUIS) 5 mg tablet 1 tablet (5 mg total) by Per G Tube route every 12 (twelve) hours.  ferrous gluconate (FERGON) 324 mg (38 mg iron) tablet Take 1 tablet (324 mg total) by mouth every other day.  gabapentin (NEURONTIN) 400 mg capsule Take 1 capsule (400 mg total) by mouth 2 (two) times daily with breakfast and dinner.  gabapentin (NEURONTIN) 600 mg tablet Take 1 tablet (600 mg total) by mouth nightly.  hydrOXYchloroQUINE (PLAQUENIL) 200 mg tablet Take 1 tablet (200 mg total) by mouth daily.Qty: 14 tablet, Refills: 0  rosuvastatin (CRESTOR) 10 mg tablet 1 tablet (10 mg total) by Per G Tube route nightly.  !! traMADoL (ULTRAM) 50 mg tablet Take 1 tablet (50 mg total) by mouth daily.  acetaminophen (TYLENOL) 325 mg tablet Take 2 tablets (650 mg total) by mouth every 8 (eight) hours as needed for pain.Qty: 30 tablet, Refills: 0  melatonin 3 mg tablet Take 1 tablet (3 mg total) by mouth nightly.Refills: 0  !!  traMADoL (ULTRAM) 50 mg tablet Take 1 tablet (50 mg total) by mouth every 8 (eight) hours as needed for pain.   !! - Potential duplicate medications found. Please discuss with provider.  STOP taking these medications   famotidine (PEPCID) 20 mg tablet    potassium and sodium phosphates, ELEMENTAL phosphorus 250 mg/packet, (PHOS-NAK) oral powder    There was at total of approximately 45 minutes spent on the discharge of this patientElectronically Signed:Brittan Mapel, MD 05/26/2023 2:12 PMBest Contact Information: pg [phone]

## 2023-06-01 ENCOUNTER — Telehealth
Admit: 2023-06-01 | Payer: PRIVATE HEALTH INSURANCE | Attending: Student in an Organized Health Care Education/Training Program | Primary: Nephrology

## 2023-06-02 ENCOUNTER — Other Ambulatory Visit: Admit: 2023-06-02 | Payer: MEDICARE | Attending: Nephrology | Primary: Internal Medicine

## 2023-06-02 DIAGNOSIS — Z932 Ileostomy status: Secondary | ICD-10-CM

## 2023-06-02 DIAGNOSIS — D649 Anemia, unspecified: Secondary | ICD-10-CM

## 2023-06-02 DIAGNOSIS — G629 Polyneuropathy, unspecified: Secondary | ICD-10-CM

## 2023-06-02 LAB — COMPREHENSIVE METABOLIC PANEL
BKR A/G RATIO: 0.6
BKR ALANINE AMINOTRANSFERASE (ALT): 8 U/L — ABNORMAL LOW (ref 12–78)
BKR ALBUMIN: 3.4 g/dL (ref 3.4–5.0)
BKR ALKALINE PHOSPHATASE: 71 U/L (ref 20–120)
BKR ANION GAP: 4 % — ABNORMAL LOW (ref 5–18)
BKR ASPARTATE AMINOTRANSFERASE (AST): 13 U/L (ref 5–37)
BKR AST/ALT RATIO: 1.6 x 1000/ÂµL (ref 2.00–7.60)
BKR BILIRUBIN TOTAL: 0.2 mg/dL (ref 0.0–1.0)
BKR BLOOD UREA NITROGEN: 12 mg/dL — ABNORMAL LOW (ref 8–25)
BKR BUN / CREAT RATIO: 16.2 (ref 8.0–25.0)
BKR CALCIUM: 10.9 mg/dL — ABNORMAL HIGH (ref 8.4–10.3)
BKR CHLORIDE: 111 mmol/L — ABNORMAL LOW (ref 95–115)
BKR CO2: 25 mmol/L — ABNORMAL LOW (ref 21–32)
BKR CREATININE: 0.74 mg/dL (ref 0.50–1.30)
BKR EGFR, CREATININE (CKD-EPI 2021): >60 mL/min/{1.73_m2} (ref >=60–1.00)
BKR GLOBULIN: 5.3 g/dL
BKR GLUCOSE: 64 mg/dL — ABNORMAL LOW (ref 70–100)
BKR OSMOLALITY CALCULATION: 277 mOsm/kg (ref 275–295)
BKR POTASSIUM: 4.3 mmol/L (ref 3.5–5.1)
BKR PROTEIN TOTAL: 8.7 g/dL — ABNORMAL HIGH (ref 6.4–8.2)
BKR SODIUM: 140 mmol/L (ref 136–145)

## 2023-06-02 LAB — CBC WITH AUTO DIFFERENTIAL
BKR WAM ABSOLUTE IMMATURE GRANULOCYTES.: 0.01 x 1000/ÂµL (ref 0.00–0.30)
BKR WAM ABSOLUTE LYMPHOCYTE COUNT.: 1.35 x 1000/ÂµL (ref 0.60–3.70)
BKR WAM ABSOLUTE NRBC (2 DEC): 0 x 1000/ÂµL (ref 0.00–1.00)
BKR WAM ANALYZER ANC: 2.13 x 1000/ÂµL (ref 2.00–7.60)
BKR WAM BASOPHIL ABSOLUTE COUNT.: 0.03 x 1000/ÂµL (ref 0.00–1.00)
BKR WAM BASOPHILS: 0.7 % (ref 0.0–1.4)
BKR WAM EOSINOPHIL ABSOLUTE COUNT.: 0.3 x 1000/ÂµL (ref 0.00–1.00)
BKR WAM EOSINOPHILS: 6.9 % — ABNORMAL HIGH (ref 0.0–5.0)
BKR WAM HEMATOCRIT (2 DEC): 35.5 % (ref 35.00–45.00)
BKR WAM HEMOGLOBIN: 10.4 g/dL — ABNORMAL LOW (ref 11.7–15.5)
BKR WAM IMMATURE GRANULOCYTES: 0.2 % (ref 0.0–1.0)
BKR WAM LYMPHOCYTES: 31 % (ref 17.0–50.0)
BKR WAM MCH (PG): 26.9 pg — ABNORMAL LOW (ref 27.0–33.0)
BKR WAM MCHC: 29.3 g/dL — ABNORMAL LOW (ref 31.0–36.0)
BKR WAM MCV: 91.7 fL (ref 80.0–100.0)
BKR WAM MONOCYTE ABSOLUTE COUNT.: 0.54 x 1000/ÂµL (ref 0.00–1.00)
BKR WAM MONOCYTES: 12.4 % — ABNORMAL HIGH (ref 4.0–12.0)
BKR WAM MPV: 10.8 fL — ABNORMAL HIGH (ref 8.0–12.0)
BKR WAM NEUTROPHILS: 48.8 % (ref 39.0–72.0)
BKR WAM NUCLEATED RED BLOOD CELLS: 0 % (ref 0.0–1.0)
BKR WAM PLATELETS: 294 x1000/ÂµL (ref 150–420)
BKR WAM RDW-CV: 14.9 % — ABNORMAL HIGH (ref 11.0–15.0)
BKR WAM RED BLOOD CELL COUNT.: 3.87 M/ÂµL — ABNORMAL LOW (ref 4.00–6.00)
BKR WAM WHITE BLOOD CELL COUNT: 4.4 x1000/ÂµL (ref 4.0–11.0)

## 2023-06-03 ENCOUNTER — Encounter
Admit: 2023-06-03 | Payer: MEDICAID | Attending: Student in an Organized Health Care Education/Training Program | Primary: Nephrology

## 2023-06-15 ENCOUNTER — Telehealth: Admit: 2023-06-15 | Payer: PRIVATE HEALTH INSURANCE | Attending: Medical Oncology | Primary: Internal Medicine

## 2023-06-15 ENCOUNTER — Inpatient Hospital Stay: Admit: 2023-06-15 | Discharge: 2023-06-15 | Payer: MEDICARE | Primary: Nephrology

## 2023-06-15 ENCOUNTER — Encounter: Admit: 2023-06-15 | Payer: PRIVATE HEALTH INSURANCE | Attending: Medical Oncology | Primary: Nephrology

## 2023-06-15 ENCOUNTER — Ambulatory Visit: Admit: 2023-06-15 | Payer: MEDICARE | Attending: Medical Oncology | Primary: Internal Medicine

## 2023-06-15 DIAGNOSIS — D508 Other iron deficiency anemias: Secondary | ICD-10-CM

## 2023-06-15 DIAGNOSIS — R1310 Dysphagia, unspecified: Secondary | ICD-10-CM

## 2023-06-15 DIAGNOSIS — G9340 Encephalopathy, unspecified: Secondary | ICD-10-CM

## 2023-06-15 DIAGNOSIS — D649 Anemia, unspecified: Secondary | ICD-10-CM

## 2023-06-15 DIAGNOSIS — I82403 Acute embolism and thrombosis of unspecified deep veins of lower extremity, bilateral: Secondary | ICD-10-CM

## 2023-06-15 DIAGNOSIS — G8220 Paraplegia, unspecified: Secondary | ICD-10-CM

## 2023-06-15 DIAGNOSIS — Z7901 Long term (current) use of anticoagulants: Secondary | ICD-10-CM

## 2023-06-15 DIAGNOSIS — M069 Rheumatoid arthritis, unspecified: Secondary | ICD-10-CM

## 2023-06-15 DIAGNOSIS — L03115 Cellulitis of right lower limb: Secondary | ICD-10-CM

## 2023-06-15 DIAGNOSIS — I82419 Acute embolism and thrombosis of unspecified femoral vein: Secondary | ICD-10-CM

## 2023-06-15 DIAGNOSIS — K219 Gastro-esophageal reflux disease without esophagitis: Secondary | ICD-10-CM

## 2023-06-15 LAB — CBC WITH AUTO DIFFERENTIAL
BKR BASOPHILS: 0.5 % (ref 0.0–1.0)
BKR EOSINOPHILS: 4.7 % (ref 0.0–5.0)
BKR HEMATOCRIT: 32.9 % — ABNORMAL LOW (ref 35.00–45.00)
BKR HEMOGLOBIN: 10.4 g/dL — ABNORMAL LOW (ref 11.7–15.5)
BKR LYMPHOCYTES: 25.2 % (ref 17.0–50.0)
BKR MCH: 28.2 pg — ABNORMAL LOW (ref 27.0–33.0)
BKR MCHC: 31.6 g/dL — ABNORMAL LOW (ref 31.0–36.0)
BKR MCV: 89.2 fL — ABNORMAL LOW (ref 80.0–100.0)
BKR MONOCYTES: 9.9 % (ref 4.0–12.0)
BKR MPV: 9.8 fL (ref 8.0–12.0)
BKR NEUTROPHILS: 59.7 % (ref 39.0–72.0)
BKR PLATELETS: 295 x 1000/ÂµL (ref 150–420)
BKR RDW-CV: 15.2 % — ABNORMAL HIGH (ref 11.0–15.0)
BKR WAM ANALYZER ANC: 2.54 x 1000/ÂµL (ref 2.00–7.60)
BKR WAM RED BLOOD CELL COUNT.: 3.69 M/ÂµL — ABNORMAL LOW (ref 4.00–6.00)
BKR WHITE BLOOD CELL COUNT: 4.3 x1000/ÂµL (ref 4.0–11.0)

## 2023-06-15 LAB — COMPREHENSIVE METABOLIC PANEL
BKR A/G RATIO: 0.6
BKR ALANINE AMINOTRANSFERASE (ALT): 9 U/L — ABNORMAL LOW (ref 12–78)
BKR ALBUMIN: 3.3 g/dL — ABNORMAL LOW (ref 3.4–5.0)
BKR ALKALINE PHOSPHATASE: 77 U/L (ref 20–120)
BKR ANION GAP: 3 — ABNORMAL LOW (ref 5–18)
BKR ASPARTATE AMINOTRANSFERASE (AST): 12 U/L (ref 5–37)
BKR AST/ALT RATIO: 1.3
BKR BILIRUBIN TOTAL: 0.2 mg/dL (ref 0.0–1.0)
BKR BLOOD UREA NITROGEN: 9 mg/dL (ref 8–25)
BKR BUN / CREAT RATIO: 13.2 % (ref 8.0–25.0)
BKR CALCIUM: 10.6 mg/dL — ABNORMAL HIGH (ref 8.4–10.3)
BKR CHLORIDE: 114 mmol/L (ref 95–115)
BKR CO2: 24 mmol/L (ref 21–32)
BKR CREATININE: 0.68 mg/dL (ref 0.50–1.30)
BKR EGFR, CREATININE (CKD-EPI 2021): >60 mL/min/{1.73_m2} (ref >=60)
BKR GLOBULIN: 5.3 g/dL
BKR GLUCOSE: 88 mg/dL (ref 70–100)
BKR OSMOLALITY CALCULATION: 279 mosm/kg (ref 275–295)
BKR POTASSIUM: 4.6 mmol/L (ref 3.5–5.1)
BKR PROTEIN TOTAL: 8.6 g/dL — ABNORMAL HIGH (ref 6.4–8.2)
BKR SODIUM: 141 mmol/L — ABNORMAL LOW (ref 136–145)

## 2023-06-15 LAB — IRON AND TIBC
BKR IRON SATURATION (SCCCT BH GH LM): 15 % (ref 11–46)
BKR IRON: 36 ug/dL — ABNORMAL LOW (ref 40–150)
BKR TOTAL IRON BINDING CAPACITY (SCCCT  BH GH LM): 242 ug/dL — ABNORMAL LOW (ref 250–450)

## 2023-06-15 LAB — FERRITIN: BKR FERRITIN: 208 ng/mL (ref 8–252)

## 2023-06-15 LAB — RETICULOCYTES
BKR WAM IRF: 12.3 % (ref 3.0–15.9)
BKR WAM RETICULOCYTE - ABS (3 DEC): 0.045 10Ë6 cells/uL (ref 0.023–0.140)
BKR WAM RETICULOCYTE COUNT PCT (1 DEC): 1.2 % (ref 0.6–2.7)
BKR WAM RETICULOCYTE HGB EQUIVALENT: 32 pg (ref 28.2–35.7)

## 2023-06-15 NOTE — Unmapped
TC left VM, not sure of pt's question as far as how to get here for appointment as appointment was scheduled a while back.  Also attempted to call Germaine Pomfret at [phone], where pt. Is currently on (2nd Floor), no answer, called nursing supervisor at [phone].Called above facility again to confirm ride is being provided for from there.  Pt is all set to be brought over for the appointment.

## 2023-06-15 NOTE — Unmapped
PT has appt today with Nedra Hai at 1120 today and Patient reside at [facility] and is Requesting transportion not sure if we provide that or they doBest call back [phone]

## 2023-06-15 NOTE — Unmapped
Subjective:   Danielle [name] is a 67 y.o. female  who had concerns including Follow-up.HPIIn November 2022, she was admitted to a hospital in [place] with hemoperitoneum, due to a diverticular perforation.  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  She also developed encephalopathy.  She was eventually transferred to a skilled nursing facility on 02/05/2022.  Thereafter, patient was brought to [facility] on 02/11/2022 for management of encephalopathy.  She had a six-month hospitalization.  She was discharged to another skilled nursing care facility on 08/14/2022 with instructions to follow-up with a hematologist about the duration of anticoagulation therapy. She was then admitted to [facility] in March of 2024 again with altered mental status secondary to sepsis.  She was treated for UTI/sepsis and discharged back to skilled nursing care facility within a week. On 05/24/2023, she presented to [facility] with blood from the stoma.Doppler ultrasound on 05/24/2023 showed that she have a new partially occlusive thrombus in the right popliteal vein. Blood test on 05/24/2023 was consistent with iron deficiencyHemoglobin was reduced at 8.7She received 1 g of iron dextran on 05/25/2023 INTERIM HISTORY:At the time of her initial hematologic consultation on 05/26/2023, I felt that she was at an increased risk of future thrombus and I recommended that she remain on the anticoagulant therapy indefinitely - until her condition changes.  Later that day, she returned to the skilled nursing care facilityReturns today for a follow-upShe remains on Eliquis 5 mg twice a dayShe has not had any signs of intestinal or urinary blood lossShe had been well undergoing physical therapyHowever, earlier today she had another bout of arthritisShe is not able to [facility] MEDICAL HISTORY:Rheumatoid arthritisDiverticular perforation with abscess-November 2022Recurrent sepsisAnemia/iron deficiencyMEDICATIONS:Current Outpatient Medications Medication Sig  acetaminophen (TYLENOL) 325 mg tablet Take 2 tablets (650 mg total) by mouth every 8 (eight) hours as needed for pain.  apixaban (ELIQUIS) 5 mg tablet 1 tablet (5 mg total) by Per G Tube route every 12 (twelve) hours.  ferrous gluconate (FERGON) 324 mg (38 mg iron) tablet Take 1 tablet (324 mg total) by mouth every other day.  folic acid (FOLVITE) 1 mg tablet Take 1 tablet (1 mg total) by mouth daily.  gabapentin (NEURONTIN) 400 mg capsule Take 1 capsule (400 mg total) by mouth 2 (two) times daily with breakfast and dinner.  gabapentin (NEURONTIN) 600 mg tablet Take 1 tablet (600 mg total) by mouth nightly.  hydrOXYchloroQUINE (PLAQUENIL) 200 mg tablet Take 1 tablet (200 mg total) by mouth daily.  melatonin 3 mg tablet Take 1 tablet (3 mg total) by mouth nightly.  pantoprazole (PROTONIX) 40 mg tablet Take 1 tablet (40 mg total) by mouth daily.  rosuvastatin (CRESTOR) 10 mg tablet 1 tablet (10 mg total) by Per G Tube route nightly.  tamsulosin (FLOMAX) 0.4 mg 24 hr capsule Take 1 capsule (0.4 mg total) by mouth nightly.  traMADoL (ULTRAM) 50 mg tablet Take 1 tablet (50 mg total) by mouth daily.  traMADoL (ULTRAM) 50 mg tablet Take 1 tablet (50 mg total) by mouth every 8 (eight) hours as needed for pain. No current facility-administered medications for this visit. SOCIAL HISTORY:Never smokedNever consumed alcohol regularlyFAMILY HISTORY:No known family history of malignancies ALLERGIES:No Known Allergies Review of Systems  Objective:  BP 100/66 (Site: r a, Position: Sitting, Cuff Size: Medium)  - Pulse 69  - Temp 97.5 ?F (36.4 ?C) (Temporal)  - Resp 18  - Ht 5' 10 (1.778 m)  -  Wt 59.4 kg  - SpO2 98%  - BMI 18.80 kg/m? Vitals:  06/15/23 1131 Position: Sitting Physical ExamSlight left leg edemaNo calf tenderness Latest Reference Range & Units 05/26/23 06:39 06/02/23 12:15 06/15/23 12:00 Iron Saturation 11 - 46 %   15 Ferritin 8 - 252 ng/mL   208 Hemoglobin 11.7 - 15.5 g/dL 9.6 (L) 38.7 (L) 56.4 (L) (L): Data is abnormally low  Assessment / Plan:  History of DVT Questionable New right popliteal DVT-05/24/2023 Anemia due to blood loss Current findings were discussedHemoglobin remains improvedBlood test indicates that she is fully iron repleteI recommended that she repeat the Doppler ultrasound of the bilateral lower extremityPending the outcome of the ultrasound, we will contact the patient and give her follow-up recommendations.In the meantime, she will remain on the anticoagulant therapyPlease note: MModal speech recognition software was used to compose this note. Notes are reviewed for accuracy but unintended translational errors can occur. Please contact me if there are any questions about the content of this note.Orders Placed This Encounter Procedures  US Duplex Lower Extremity Venous Bilat  Iron, TIBC and ferritin panel ([facility])  Reticulocytes  CBC and differential  Comprehensive metabolic panel  Electronically Signed by Isaias Cowman, MD, June 15, 2023

## 2023-06-19 ENCOUNTER — Telehealth: Admit: 2023-06-19 | Payer: PRIVATE HEALTH INSURANCE | Primary: Internal Medicine

## 2023-06-19 ENCOUNTER — Inpatient Hospital Stay: Admit: 2023-06-19 | Discharge: 2023-06-19 | Payer: MEDICARE | Primary: Nephrology

## 2023-06-19 DIAGNOSIS — D508 Other iron deficiency anemias: Secondary | ICD-10-CM

## 2023-06-19 DIAGNOSIS — Z7901 Long term (current) use of anticoagulants: Secondary | ICD-10-CM

## 2023-06-19 DIAGNOSIS — I82403 Acute embolism and thrombosis of unspecified deep veins of lower extremity, bilateral: Secondary | ICD-10-CM

## 2023-06-19 NOTE — Unmapped
TC left VM on brothers phone for him to call back to relay message below.  Will give him message when he calls back.----- Message from Isaias Cowman, MD sent at 06/19/2023  1:42 PM EDT -----Please call patient's sisterLet him know that the ultrasound showed no evidence of a blood clotEven so, I want her to stay on the anticoagulant therapyI would like her to return to see me for another follow-up blood test and visit in 3 monthsDepending on her condition, I will reconsider keeping her on the anticoagulant

## 2023-06-22 ENCOUNTER — Telehealth: Admit: 2023-06-22 | Payer: PRIVATE HEALTH INSURANCE | Attending: Medical Oncology | Primary: Internal Medicine

## 2023-06-22 NOTE — Unmapped
Returned call, no answer, LM.

## 2023-06-22 NOTE — Unmapped
Spoke with sister. Patient is in long term rehab. She said that she would let them know if the appointment. Scheduled for October.MessageReceived: 3 days Danielle Roys, MD  [facility] [place] Front DeskPlease call the patient's sister and schedule the patient to return to see me for follow-up blood test and visit in about 3 months

## 2023-06-22 NOTE — Unmapped
Pt Brother [name] Returning Phone callBest call back [phone]

## 2023-06-22 NOTE — Unmapped
Brother was called by nurse with message. Wanted to make sure everything was ok with his sister. Relayed the message that was left on his voicemail.

## 2023-06-24 ENCOUNTER — Encounter
Admit: 2023-06-24 | Payer: PRIVATE HEALTH INSURANCE | Attending: Vascular and Interventional Radiology | Primary: Nephrology

## 2023-06-24 ENCOUNTER — Encounter: Admit: 2023-06-24 | Payer: PRIVATE HEALTH INSURANCE | Primary: Internal Medicine

## 2023-06-24 DIAGNOSIS — N201 Calculus of ureter: Secondary | ICD-10-CM

## 2023-06-26 ENCOUNTER — Inpatient Hospital Stay: Admit: 2023-06-26 | Payer: MEDICARE

## 2023-06-26 ENCOUNTER — Inpatient Hospital Stay
Admit: 2023-06-26 | Discharge: 2023-07-01 | Payer: MEDICARE | Attending: Internal Medicine | Admitting: Internal Medicine

## 2023-06-26 ENCOUNTER — Inpatient Hospital Stay: Admit: 2023-06-26 | Payer: MEDICARE | Attending: Anesthesiology

## 2023-06-26 ENCOUNTER — Emergency Department: Admit: 2023-06-26 | Payer: MEDICARE | Primary: Nephrology

## 2023-06-26 ENCOUNTER — Other Ambulatory Visit: Admit: 2023-06-26 | Payer: MEDICARE | Attending: Nephrology | Primary: Internal Medicine

## 2023-06-26 ENCOUNTER — Ambulatory Visit: Admit: 2023-06-26 | Payer: MEDICARE | Primary: Internal Medicine

## 2023-06-26 DIAGNOSIS — N200 Calculus of kidney: Secondary | ICD-10-CM

## 2023-06-26 DIAGNOSIS — Z7689 Persons encountering health services in other specified circumstances: Secondary | ICD-10-CM

## 2023-06-26 LAB — CBC WITH AUTO DIFFERENTIAL
BKR WAM ABSOLUTE IMMATURE GRANULOCYTES.: 0.15 x 1000/ÂµL (ref 0.00–0.30)
BKR WAM ABSOLUTE LYMPHOCYTE COUNT.: 0.77 x 1000/ÂµL (ref 0.60–3.70)
BKR WAM ABSOLUTE NRBC (2 DEC): 0 x 1000/ÂµL (ref 0.00–1.00)
BKR WAM ANALYZER ANC: 17.68 x 1000/ÂµL — ABNORMAL HIGH (ref 2.00–7.60)
BKR WAM BASOPHIL ABSOLUTE COUNT.: 0.05 x 1000/ÂµL (ref 0.00–1.00)
BKR WAM BASOPHILS: 0.3 % (ref 0.0–1.4)
BKR WAM EOSINOPHIL ABSOLUTE COUNT.: 0 x 1000/ÂµL (ref 0.00–1.00)
BKR WAM EOSINOPHILS: 0 % (ref 0.0–5.0)
BKR WAM HEMATOCRIT (2 DEC): 35.6 % (ref 35.00–45.00)
BKR WAM HEMOGLOBIN: 11.3 g/dL — ABNORMAL LOW (ref 11.7–15.5)
BKR WAM IMMATURE GRANULOCYTES: 0.8 % (ref 0.0–1.0)
BKR WAM LYMPHOCYTES: 3.9 % — ABNORMAL LOW (ref 17.0–50.0)
BKR WAM MCH (PG): 27.9 pg (ref 27.0–33.0)
BKR WAM MCHC: 31.7 g/dL (ref 31.0–36.0)
BKR WAM MCV: 87.9 fL (ref 80.0–100.0)
BKR WAM MONOCYTE ABSOLUTE COUNT.: 0.94 x 1000/ÂµL (ref 0.00–1.00)
BKR WAM MONOCYTES: 4.8 % (ref 4.0–12.0)
BKR WAM MPV: 10.8 fL (ref 8.0–12.0)
BKR WAM NEUTROPHILS: 90.2 % — ABNORMAL HIGH (ref 39.0–72.0)
BKR WAM NUCLEATED RED BLOOD CELLS: 0 % (ref 0.0–1.0)
BKR WAM PLATELETS: 238 x1000/ÂµL (ref 150–420)
BKR WAM RDW-CV: 15.2 % — ABNORMAL HIGH (ref 11.0–15.0)
BKR WAM RED BLOOD CELL COUNT.: 4.05 M/ÂµL (ref 4.00–6.00)
BKR WAM WHITE BLOOD CELL COUNT: 19.6 x1000/ÂµL — ABNORMAL HIGH (ref 4.0–11.0)

## 2023-06-26 LAB — COMPREHENSIVE METABOLIC PANEL
BKR A/G RATIO: 0.5
BKR ALANINE AMINOTRANSFERASE (ALT): 10 U/L — ABNORMAL LOW (ref 12–78)
BKR ALBUMIN: 2.8 g/dL — ABNORMAL LOW (ref 3.4–5.0)
BKR ALKALINE PHOSPHATASE: 65 U/L (ref 20–120)
BKR ANION GAP: 5 (ref 5–18)
BKR ASPARTATE AMINOTRANSFERASE (AST): 21 U/L (ref 5–37)
BKR AST/ALT RATIO: 2.1
BKR BILIRUBIN TOTAL: 0.4 mg/dL (ref 0.0–1.0)
BKR BLOOD UREA NITROGEN: 16 mg/dL (ref 8–25)
BKR BUN / CREAT RATIO: 10.4 (ref 8.0–25.0)
BKR CALCIUM: 10.1 mg/dL (ref 8.4–10.3)
BKR CHLORIDE: 112 mmol/L (ref 95–115)
BKR CO2: 22 mmol/L (ref 21–32)
BKR CREATININE: 1.54 mg/dL — ABNORMAL HIGH (ref 0.50–1.30)
BKR EGFR, CREATININE (CKD-EPI 2021): 36 mL/min/{1.73_m2} — ABNORMAL LOW (ref >=60)
BKR GLOBULIN: 5.2 g/dL
BKR GLUCOSE: 116 mg/dL — ABNORMAL HIGH (ref 70–100)
BKR OSMOLALITY CALCULATION: 280 mosm/kg (ref 275–295)
BKR POTASSIUM: 4.2 mmol/L (ref 3.5–5.1)
BKR PROTEIN TOTAL: 8 g/dL (ref 6.4–8.2)
BKR SODIUM: 139 mmol/L (ref 136–145)

## 2023-06-26 LAB — SARS-COV-2 (COVID-19)/INFLUENZA A+B/RSV BY RT-PCR (BH GH LMW YH)
BKR INFLUENZA A: NEGATIVE
BKR INFLUENZA B: NEGATIVE
BKR RESPIRATORY SYNCYTIAL VIRUS: NEGATIVE
BKR SARS-COV-2 RNA (COVID-19) (YH): NEGATIVE

## 2023-06-26 LAB — PROTIME AND INR
BKR INR: 1.12 (ref 0.88–1.14)
BKR PROTHROMBIN TIME: 11.8 s (ref 9.4–12.0)

## 2023-06-26 LAB — URINE MICROSCOPIC     (BH GH LMW YH)

## 2023-06-26 LAB — CBC WITHOUT DIFFERENTIAL
BKR WAM ANALYZER ANC: 19.82 x 1000/ÂµL — ABNORMAL HIGH (ref 2.00–7.60)
BKR WAM HEMATOCRIT (2 DEC): 36.8 % (ref 35.00–45.00)
BKR WAM HEMOGLOBIN: 11.5 g/dL — ABNORMAL LOW (ref 11.7–15.5)
BKR WAM MCH (PG): 27.8 pg (ref 27.0–33.0)
BKR WAM MCHC: 31.3 g/dL (ref 31.0–36.0)
BKR WAM MCV: 89.1 fL (ref 80.0–100.0)
BKR WAM MPV: 11 fL (ref 8.0–12.0)
BKR WAM PLATELETS: 228 x1000/ÂµL (ref 150–420)
BKR WAM RDW-CV: 15.2 % — ABNORMAL HIGH (ref 11.0–15.0)
BKR WAM RED BLOOD CELL COUNT.: 4.13 M/ÂµL (ref 4.00–6.00)
BKR WAM WHITE BLOOD CELL COUNT: 22.2 x1000/ÂµL — ABNORMAL HIGH (ref 4.0–11.0)

## 2023-06-26 LAB — BASIC METABOLIC PANEL
BKR ANION GAP: 11 (ref 5–18)
BKR BLOOD UREA NITROGEN: 16 mg/dL (ref 8–25)
BKR BUN / CREAT RATIO: 11.9 seconds (ref 8.0–25.0)
BKR CALCIUM: 10.5 mg/dL — ABNORMAL HIGH (ref 8.4–10.3)
BKR CHLORIDE: 110 mmol/L (ref 95–115)
BKR CO2: 23 mmol/L (ref 21–32)
BKR CREATININE: 1.34 mg/dL — ABNORMAL HIGH (ref 0.50–1.30)
BKR EGFR, CREATININE (CKD-EPI 2021): 43 mL/min/{1.73_m2} — ABNORMAL LOW (ref >=60)
BKR GLUCOSE: 80 mg/dL (ref 70–100)
BKR OSMOLALITY CALCULATION: 287 mOsm/kg (ref 275–295)
BKR POTASSIUM: 3.3 mmol/L — ABNORMAL LOW (ref 3.5–5.1)
BKR SODIUM: 144 mmol/L (ref 136–145)

## 2023-06-26 LAB — URINALYSIS WITH CULTURE REFLEX      (BH LMW YH)
BKR BILIRUBIN, UA: NEGATIVE
BKR GLUCOSE, UA: NEGATIVE
BKR KETONES, UA: NEGATIVE
BKR NITRITE, UA: POSITIVE — AB
BKR PH, UA: 7 (ref 5.5–7.5)
BKR SPECIFIC GRAVITY, UA: 1.025 (ref 1.005–1.030)
BKR UROBILINOGEN, UA (MG/DL): <2 mg/dL (ref <=2.0)

## 2023-06-26 LAB — LACTIC ACID, PLASMA (REFLEX 2H REPEAT): BKR LACTATE: 1.9 mmol/L (ref 0.4–2.0)

## 2023-06-26 MED ORDER — MORPHINE 2 MG/ML INJECTION SYRINGE
2 | Freq: Once | INTRAVENOUS | Status: CP
Start: 2023-06-26 — End: ?
  Administered 2023-06-26: 23:00:00 2 mL via INTRAVENOUS

## 2023-06-26 MED ORDER — TAMSULOSIN 0.4 MG CAPSULE
0.4 | Freq: Every evening | ORAL | Status: DC
Start: 2023-06-26 — End: 2023-06-27

## 2023-06-26 MED ORDER — POLYETHYLENE GLYCOL 3350 17 GRAM ORAL POWDER PACKET
17 gram | Freq: Every day | ORAL | Status: DC
Start: 2023-06-26 — End: 2023-06-27

## 2023-06-26 MED ORDER — SODIUM CHLORIDE 0.9 % IRRIGATION SOLUTION
0.9 % irrigation | Status: CP
Start: 2023-06-26 — End: ?
  Administered 2023-06-27: 03:00:00 0.9 % irrigation

## 2023-06-26 MED ORDER — IOHEXOL 300 MG IODINE/ML INTRAVENOUS SOLUTION
300 mg iodine/mL | Status: DC | PRN
Start: 2023-06-26 — End: 2023-06-27
  Administered 2023-06-27: 03:00:00 300 mg iodine/mL via INTRAVENOUS

## 2023-06-26 MED ORDER — CEFTRIAXONE IV PUSH 1000 MG VIAL & NS (ADULTS)
Freq: Once | INTRAVENOUS | Status: CP
Start: 2023-06-26 — End: ?
  Administered 2023-06-26: 22:00:00 10.000 mL via INTRAVENOUS

## 2023-06-26 MED ORDER — LACTATED RINGERS IV BOLUS (NEW BAG)
Freq: Once | INTRAVENOUS | Status: CP
Start: 2023-06-26 — End: ?
  Administered 2023-06-27: 01:00:00 1000.000 mL/h via INTRAVENOUS

## 2023-06-26 MED ORDER — ONDANSETRON HCL (PF) 4 MG/2 ML INJECTION SOLUTION
42 mg/2 mL | Status: CP
Start: 2023-06-26 — End: ?

## 2023-06-26 MED ORDER — SODIUM CHLORIDE 0.9 % BOLUS (NEW BAG)
0.9 | Freq: Once | INTRAVENOUS | Status: CP
Start: 2023-06-26 — End: ?
  Administered 2023-06-26: 21:00:00 0.9 mL/h via INTRAVENOUS

## 2023-06-26 MED ORDER — HEPARIN 25,000 UNIT/250 0.45 % NS - VTE TREATMENT (DVT/PE) (ALL SITES)
25000 unit/250 mL | INTRAVENOUS | Status: DC
Start: 2023-06-26 — End: 2023-06-27
  Administered 2023-06-27: 06:00:00 via INTRAVENOUS

## 2023-06-26 MED ORDER — LACTATED RINGERS INTRAVENOUS SOLUTION
INTRAVENOUS | Status: DC
Start: 2023-06-26 — End: 2023-06-27
  Administered 2023-06-27 (×2): 1000.000 mL/h via INTRAVENOUS

## 2023-06-26 MED ORDER — ACETAMINOPHEN 500 MG TABLET
500 mg | Freq: Four times a day (QID) | ORAL | Status: DC
Start: 2023-06-26 — End: 2023-06-27

## 2023-06-26 MED ORDER — ONDANSETRON HCL (PF) 4 MG/2 ML INJECTION SOLUTION
42 mg/2 mL | INTRAVENOUS | Status: DC | PRN
Start: 2023-06-26 — End: 2023-06-27

## 2023-06-26 MED ORDER — DEXAMETHASONE SODIUM PHOSPHATE (PF) 10 MG/ML INJECTION SOLUTION
10 mg/mL | Freq: Once | INTRAVENOUS | Status: DC | PRN
Start: 2023-06-26 — End: 2023-06-27

## 2023-06-26 MED ORDER — HYDROXYCHLOROQUINE 200 MG TABLET
200 mg | Freq: Every day | ORAL | Status: DC
Start: 2023-06-26 — End: 2023-06-27

## 2023-06-26 MED ORDER — SODIUM CHLORIDE 0.9 % BOLUS (NEW BAG)
0.9 | Freq: Once | INTRAVENOUS | Status: CP
Start: 2023-06-26 — End: ?
  Administered 2023-06-26: 23:00:00 0.9 mL/h via INTRAVENOUS

## 2023-06-26 MED ORDER — HEPARIN BOLUS FROM BAG (YNH INTERMITTENT 2)
Freq: Four times a day (QID) | INTRAVENOUS | Status: DC | PRN
Start: 2023-06-26 — End: 2023-06-27

## 2023-06-26 MED ORDER — FENTANYL (PF) 50 MCG/ML INJECTION SOLUTION
50 mcg/mL | INTRAVENOUS | Status: DC | PRN
Start: 2023-06-26 — End: 2023-06-27
  Administered 2023-06-27 (×2): 50 mcg/mL via INTRAVENOUS

## 2023-06-26 MED ORDER — LACTATED RINGERS INTRAVENOUS SOLUTION
INTRAVENOUS | Status: DC
Start: 2023-06-26 — End: 2023-06-27

## 2023-06-26 MED ORDER — OXYCODONE IMMEDIATE RELEASE 10 MG TABLET
10 mg | ORAL | Status: DC | PRN
Start: 2023-06-26 — End: 2023-06-27

## 2023-06-26 MED ORDER — HYDROMORPHONE 0.5 MG/0.5 ML INJECTION SYRINGE
0.50.5 mg/ mL | INTRAVENOUS | Status: DC | PRN
Start: 2023-06-26 — End: 2023-06-27

## 2023-06-26 MED ORDER — ONDANSETRON 4 MG DISINTEGRATING TABLET
4 mg | Freq: Four times a day (QID) | ORAL | Status: DC | PRN
Start: 2023-06-26 — End: 2023-07-01

## 2023-06-26 MED ORDER — NALOXONE 0.4 MG/ML INJECTION SOLUTION
0.4 mg/mL | INTRAVENOUS | Status: DC | PRN
Start: 2023-06-26 — End: 2023-06-27

## 2023-06-26 MED ORDER — ONDANSETRON HCL (PF) 4 MG/2 ML INJECTION SOLUTION
42 mg/2 mL | INTRAVENOUS | Status: DC | PRN
Start: 2023-06-26 — End: 2023-06-27
  Administered 2023-06-27: 03:00:00 4 mg/2 mL via INTRAVENOUS

## 2023-06-26 MED ORDER — PROPOFOL 10 MG/ML INTRAVENOUS EMULSION
10 mg/mL | INTRAVENOUS | Status: DC | PRN
Start: 2023-06-26 — End: 2023-06-27
  Administered 2023-06-27: 02:00:00 10 mg/mL via INTRAVENOUS

## 2023-06-26 MED ORDER — TOBRAMYCIN 40 MG/ML INJECTION SOLUTION
40 mg/mL | Status: CP
Start: 2023-06-26 — End: ?

## 2023-06-26 MED ORDER — SODIUM CHLORIDE 0.9 % (FLUSH) INJECTION SYRINGE
0.9 % | Freq: Three times a day (TID) | INTRAVENOUS | Status: DC
Start: 2023-06-26 — End: 2023-07-01
  Administered 2023-06-27 – 2023-06-30 (×5): 0.9 mL via INTRAVENOUS

## 2023-06-26 MED ORDER — ZZ IMS TEMPLATE
Freq: Two times a day (BID) | ORAL | Status: DC
Start: 2023-06-26 — End: 2023-07-01
  Administered 2023-06-27 – 2023-07-01 (×8): 400 mg via ORAL

## 2023-06-26 MED ORDER — LACTATED RINGERS IV BOLUS (NEW BAG)
Freq: Once | INTRAVENOUS | Status: CP
Start: 2023-06-26 — End: ?
  Administered 2023-06-27: 02:00:00 1000.000 mL/h via INTRAVENOUS

## 2023-06-26 MED ORDER — HEPARIN BOLUS FOR BAG (YNH INTERMITTENT)
Freq: Four times a day (QID) | INTRAVENOUS | Status: DC | PRN
Start: 2023-06-26 — End: 2023-06-27

## 2023-06-26 MED ORDER — GABAPENTIN 600 MG TABLET
600 mg | Freq: Every evening | ORAL | Status: DC
Start: 2023-06-26 — End: 2023-07-01
  Administered 2023-06-28 – 2023-07-01 (×4): 600 mg via ORAL

## 2023-06-26 MED ORDER — ERTAPENEM 1000 MG MBP
INTRAVENOUS | Status: DC
Start: 2023-06-26 — End: 2023-06-27

## 2023-06-26 MED ORDER — TOBRAMYCIN 40 MG/ML INJECTION SOLUTION
40 mg/mL | INTRAVENOUS | Status: DC | PRN
Start: 2023-06-26 — End: 2023-06-27
  Administered 2023-06-27: 02:00:00 40 mg/mL via INTRAVENOUS

## 2023-06-26 MED ORDER — OXYCODONE (ROXICODONE) IMMEDIATE RELEASE 2.5 MG HALFTAB
2.5 mg | ORAL | Status: DC | PRN
Start: 2023-06-26 — End: 2023-06-27

## 2023-06-26 MED ORDER — FENTANYL (PF) 50 MCG/ML INJECTION SOLUTION
50 mcg/mL | Status: CP
Start: 2023-06-26 — End: ?

## 2023-06-26 MED ORDER — PHENYLEPHRINE 1 MG/10 ML (100 MCG/ML) IN 0.9 % SOD.CHLORIDE IV SYRINGE
110100 mg/0 mL (00 mcg/mL) | Status: CP
Start: 2023-06-26 — End: ?

## 2023-06-26 MED ORDER — ACETAMINOPHEN 325 MG TABLET
325 | Freq: Once | ORAL | Status: CP
Start: 2023-06-26 — End: ?
  Administered 2023-06-26: 21:00:00 325 mg via ORAL

## 2023-06-26 MED ORDER — LACTATED RINGERS INTRAVENOUS SOLUTION
INTRAVENOUS | Status: DC | PRN
Start: 2023-06-26 — End: 2023-06-27
  Administered 2023-06-27: 02:00:00 via INTRAVENOUS

## 2023-06-26 MED ORDER — PROPOFOL 10 MG/ML INTRAVENOUS EMULSION
10 mg/mL | Status: CP
Start: 2023-06-26 — End: ?

## 2023-06-26 MED ORDER — PHENYLEPHRINE 10 MG/ML INJECTION SOLUTION
10 mg/mL | Status: CP
Start: 2023-06-26 — End: ?

## 2023-06-26 MED ORDER — ACETAMINOPHEN 1,000 MG/100 ML (10 MG/ML) INTRAVENOUS SOLUTION
10 mg/mL | Freq: Once | INTRAVENOUS | Status: CP
Start: 2023-06-26 — End: ?
  Administered 2023-06-27: 04:00:00 10 mL/h via INTRAVENOUS

## 2023-06-26 MED ORDER — SODIUM CHLORIDE 0.9 % (FLUSH) INJECTION SYRINGE
0.9 % | INTRAVENOUS | Status: DC | PRN
Start: 2023-06-26 — End: 2023-07-01

## 2023-06-26 MED ORDER — ENOXAPARIN 40 MG/0.4 ML SUBCUTANEOUS SYRINGE
400.4 mg/0.4 mL | SUBCUTANEOUS | Status: DC
Start: 2023-06-26 — End: 2023-06-27

## 2023-06-26 MED ORDER — PHENYLEPHRINE FOR ANESTHESIA
INTRAVENOUS | Status: DC | PRN
Start: 2023-06-26 — End: 2023-06-27
  Administered 2023-06-27: 02:00:00 250.000 mL/h via INTRAVENOUS

## 2023-06-26 MED ORDER — OXYCODONE IMMEDIATE RELEASE 5 MG TABLET
5 mg | ORAL | Status: DC | PRN
Start: 2023-06-26 — End: 2023-06-27

## 2023-06-26 NOTE — ED Provider Notes
Chief Complaint Patient presents with  Hypotension   BIBA from [facility] to r/o sepsis. Pt with hypotension 80s/40s, fever, vomiting starting today. On eliquis.  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 She says that she has felt tired and her symptoms started yesterday. Pt denies any abdominal pain, cough, sneezing, or shortness of breath.Patient with recent hospitalization on 05/26/2023 for hematuria secondary to left ureter stone.ZOX:[id number] presents with hypotension and a fever, concerned for sepsis although patient with history of hypotension.  Patient currently alert and oriented x2 - at baseline.  IV fluids ordered. X-ray ordered. Urinalysis ordered. Covid test ordered. Flu swab ordered. Blood test ordered. 4:16 PMWBC 19, awaiting urinalysis.- repeat blood pressure 97 systolic after 500 cc of normal saline.  Urinalysis obtained via straight cath, cloudy consistency, we will start IV antibiotics as we await urinalysis results.6:48 PMCT abdomen significant for moderate obstruction at right kidney from 6 mm right UVJ calculus.  Case discussed with urologist on-call Dr. Hulen Luster, recommend IR consultation for stent emergent placement as patient is hypotensive.  We will contact Interventional Radiology.7:12 PMCase re discussed with urologist Dr. Hulen Luster, states will follow as inpatient, give Flomax, continue IV fluids.Independent interpretation of: CTDirectly spoke with:  Hospitalist and Consultant  Physical ExamED Triage VitalsBP: (!) 85/56 [06/26/23 1517]Pulse: 88 [06/26/23 1517]Pulse from  O2 sat: 86 [06/26/23 1532]Resp: 20 [06/26/23 1517]Temp: (!) 101 ?F (38.3 ?C) [06/26/23 1517]Temp src: Temporal [06/26/23 1517]SpO2: 97 % [06/26/23 1517] BP (!) 85/45  - Pulse 76  - Temp 98.6 ?F (37 ?C) (Oral)  - Resp 18  - SpO2 96% Physical ExamConstitutional:     Comments: Febrile HENT:    Mouth/Throat:    Mouth: Mucous membranes are dry. Cardiovascular:    Rate and Rhythm: Normal rate and regular rhythm. Abdominal:    Palpations: Abdomen is soft.  Results for orders placed or performed during the hospital encounter of 06/26/23 SARS-CoV-2 (COVID-19)/Influenza A+B/RSV by RT-PCR ([facility])  Specimen: Nasopharynx; Viral Result Value Ref Range  Influenza A Negative Negative  Influenza B Negative Negative  Respiratory Syncytial Virus Negative Negative  SARS-CoV-2 RNA (COVID-19)  Negative Negative Lactic acid, plasma (reflex 2h repeat) Result Value Ref Range  Lactate 1.9 0.4 - 2.0 mmol/L Protime and INR Result Value Ref Range  Prothrombin Time 11.8 9.4 - 12.0 seconds  INR 1.12 0.88 - 1.14 CBC auto differential Result Value Ref Range  WBC 19.6 (H) 4.0 - 11.0 x1000/?L  RBC 4.05 4.00 - 6.00 M/?L  Hemoglobin 11.3 (L) 11.7 - 15.5 g/dL  Hematocrit 09.81 19.14 - 45.00 %  MCV 87.9 80.0 - 100.0 fL  MCH 27.9 27.0 - 33.0 pg  MCHC 31.7 31.0 - 36.0 g/dL  RDW-CV 78.2 (H) 95.6 - 15.0 %  Platelets 238 150 - 420 x1000/?L  MPV 10.8 8.0 - 12.0 fL  Neutrophils 90.2 (H) 39.0 - 72.0 %  Lymphocytes 3.9 (L) 17.0 - 50.0 %  Monocytes 4.8 4.0 - 12.0 %  Eosinophils 0.0 0.0 - 5.0 %  Basophil 0.3 0.0 - 1.4 %  Immature Granulocytes 0.8 0.0 - 1.0 %  nRBC 0.0 0.0 - 1.0 %  ANC(Abs Neutrophil Count) 17.68 (H) 2.00 - 7.60 x 1000/?L  Absolute Lymphocyte Count 0.77 0.60 - 3.70 x 1000/?L  Monocyte Absolute Count 0.94 0.00 - 1.00 x 1000/?L  Eosinophil Absolute Count 0.00 0.00 - 1.00 x 1000/?L  Basophil Absolute Count 0.05 0.00 -  1.00 x 1000/?L  Absolute Immature Granulocyte Count 0.15 0.00 - 0.30 x 1000/?L  Absolute nRBC 0.00 0.00 - 1.00 x 1000/?L Comprehensive metabolic panel Result Value Ref Range  Sodium 139 136 - 145 mmol/L Potassium 4.2 3.5 - 5.1 mmol/L  Chloride 112 95 - 115 mmol/L  CO2 22 21 - 32 mmol/L  Anion Gap 5 5 - 18  Glucose 116 (H) 70 - 100 mg/dL  BUN 16 8 - 25 mg/dL  Creatinine 0.98 (H) 1.19 - 1.30 mg/dL  Calcium 14.7 8.4 - 82.9 mg/dL  BUN/Creatinine Ratio 56.2 8.0 - 25.0  Total Protein 8.0 6.4 - 8.2 g/dL  Albumin 2.8 (L) 3.4 - 5.0 g/dL  Total Bilirubin 0.4 0.0 - 1.0 mg/dL  Alkaline Phosphatase 65 20 - 120 U/L  Alanine Aminotransferase (ALT) 10 (L) 12 - 78 U/L  Aspartate Aminotransferase (AST) 21 5 - 37 U/L  Globulin 5.2 g/dL  A/G Ratio 0.5   AST/ALT Ratio 2.1 Reference Range Not Established  Osmolality Calculation 280 275 - 295 mOsm/kg  eGFR (Creatinine) 36 (L) >=60 mL/min/1.20m2 Urinalysis with culture reflex     ([facility])  Specimen: Clean Catch (Voided); Urine Result Value Ref Range  Clarity, UA Turbid (A) Clear  Color, UA Yellow Yellow, Colorless  Specific Gravity, UA 1.025 1.005 - 1.030  pH, UA 7.0 5.5 - 7.5  Protein, UA 4+ (A) Negative-Trace  Glucose, UA Negative Negative  Ketones, UA Negative Negative  Blood, UA 3+ (A) Negative  Bilirubin, UA Negative Negative  Leukocytes, UA 4+ (A) Negative  Nitrite, UA Positive (A) Negative  Urobilinogen, UA <2.0 <=2.0 mg/dL Urine microscopic     (BH GH LMW YH) Result Value Ref Range  Manual Microscopic Performed   RBC/HPF 0-2 0 - 2 /HPF  WBC/HPF Many (A) 0 - 5 /HPF  Bacteria, UA Few (A) None-Rare /HPF  Urine Squamous Epithelial Cells, UA Many (A) None-Few /HPF  RBC/HPF, UA    WBC/HPF, UA   EKG Result Value Ref Range  Heart Rate 85 bpm  QRS Interval 88 ms  QT Interval 384 ms  QTC Interval 456 ms  P Axis 6 deg  QRS Axis -15 deg  T Wave Axis 50 deg  P-R Interval 138 msec  SEVERITY Normal ECG severity ProceduresAttestation/Critical CareCritical care provided by attending: critical careCritical care time (minutes): 45.Critical care time was exclusive of teaching time.Critical care was necessary to treat or prevent imminent or life-threatening deterioration of the following conditions:  Shock.Critical care was time spent personally by me on the following activities: ordering and review of laboratory studies, ordering and review of radiographic studies, pulse oximetry and dehydration.Sepsis DocumentationConcern for sepsis clinical findings suggest sepsis, severe sepsis, or septic shockRe-perfusion exam performed: 2 hours after arrivalComments as of 06/26/23 1934 Fri Jun 26, 2023 1701 XR Chest PA or APIMPRESSION:Limited AP view-No acute abnormality. [CM] 1822 Urinalysis with culture reflex (obtain straight cath if patient unable to provide sample)(!)Leuk 4+Nitrite Positive [CM] 1844 [place] Abdomen Pelvis wo IV Contrast (No Oral)  (Renal Stone)IMPRESSION: 1. Moderate obstruction of the right kidney from a 6, right UVJ calculus.2. Nonobstructing renal calculi.3. Gallstones with trace pericholecystic fluid could be secondary to the adjacent inflammatory change. Cholecystitis cannot be entirely excluded.4. Postoperative changes with extensive colonic diverticulosis. [CM]  Comments User Index[CM] Agustin Cree, MD   Clinical Impressions as of 06/26/23 1934 Kidney stone Sepsis, due to unspecified organism, unspecified whether acute organ dysfunction present (HC Code) (HC CODE) [facility] Code)  ED DispositionAdmit Agustin Cree,  MD07/05/24 1936

## 2023-06-26 NOTE — ED Notes
7:48 PM SBAR HandoffMalone, Danielle A MaloneSituation:	Admitting Diagnosis: The primary encounter diagnosis was Kidney stone. A diagnosis of Sepsis, due to unspecified organism, unspecified whether acute organ dysfunction present (HC Code) (HC CODE) (HC Code) was also pertinent to this visit.Background:  Chief Complaint Patient presents with  Hypotension   BIBA from [facility] to r/o sepsis. Pt with hypotension 80s/40s, fever, vomiting starting today. On eliquis.  Current Code Status   Prior  Assessment:Vitals:  06/26/23 1517 06/26/23 1532 06/26/23 1741 06/26/23 1941 BP: (!) 85/56 (!) 85/57 (!) 85/45 (!) 90/54 Pulse: 88 86 76 90 Resp: 20 20 18 18  Temp: (!) 101 ?F (38.3 ?C)  98.6 ?F (37 ?C)  TempSrc: Temporal  Oral  SpO2: 97% 98% 96% 95% Access Lines   Active PICC Line / PIV Line / Intraosseous Line / ART Line / Line / CVC Line / Implanted Port   Name Placement date Placement time Site Days  Periph IV 06/26/23 1531 metacarpal(dorsum of hand), left 20 gauge 06/26/23  1531  metacarpal(dorsum of hand), left  less than 1  Periph IV 06/26/23 1540 basilic (5th finger side), right over-the-needle catheter system 22 gauge 06/26/23  1540  basilic (5th finger side), right  less than 1    Trauma:  []  N/A      []  Modified     []  Full    []  Urine Toxicology obtained   ( note: utox must be obtained within 24hours of admission) Mental status:  [x]  A/O x4  []  Confused  []  Uncooperative/Agitated  []  Lethargic 		[]  Special Needs _____  A/O x 3Sitter ordered/needed? [] Yes    [x]  No Suicide Precautions?  [] Yes    [x]  No    [place] Suicide Risk Level: low riskFunctional Status: [] Independent   [x]  Needs assistance  ParapelegiaDysphagia screen : [x]  Not Performed        ED Fall Risk: Yes Is patient on oxygen: [x] No    []  Yes  Device (Oxygen Therapy): room air  RTM : [] Yes    []  No	 [x]  With POx   []  Tele confirmedBowel/Bladder: [] Continent  []  Incontinent  []  Catheter []  External Device  Skin Alteration: []  Pressure Injury []  Wound []  None [x]  Skin Not Assessed  Pain Score:: 0Valuables Noted:  []  Dentures   []  Glasses  []  Hearing Aids []  None  [x]  Not Reviewed  Recommendations:		Pending labs/meds/blood products: [x] No    []  Yes ___	Pending imaging/procedures : [x] No    []  Yes___	Brief plan/summary: Kidney stone, SepsisMedications ordered and administered in the Emergency Department:	Medications tamsulosin (FLOMAX) 24 hr capsule 0.4 mg (has no administration in time range) sodium chloride 0.9 % (new bag) bolus 1,000 mL (0 mLs Intravenous Stopped 06/26/23 1855) acetaminophen (TYLENOL) tablet 650 mg (650 mg Oral Given 06/26/23 1631) cefTRIAXone (ROCEPHIN) 1 g in sodium chloride 0.9% PF 10 mL (100 mg/mL) (1 g Intravenous Given 06/26/23 1811) morphine syringe 2 mg (2 mg IV Push Given 06/26/23 1857) sodium chloride 0.9 % (new bag) bolus 1,000 mL (1,000 mLs Intravenous New Bag 06/26/23 1857) Labs ordered and resulted in the Emergency Department. 	Abnormal Labs Reviewed CBC WITH AUTO DIFFERENTIAL - Abnormal; Notable for the following components:     Result Value  WBC 19.6 (*)   Hemoglobin 11.3 (*)   RDW-CV 15.2 (*)   Neutrophils 90.2 (*)   Lymphocytes 3.9 (*)   ANC(Abs Neutrophil Count) 17.68 (*)   All other components within normal limits COMPREHENSIVE METABOLIC PANEL - Abnormal; Notable for the following components:  Glucose 116 (*)   Creatinine  1.54 (*)   Albumin 2.8 (*)   Alanine Aminotransferase (ALT) 10 (*)   eGFR (Creatinine) 36 (*)   All other components within normal limits URINALYSIS WITH CULTURE REFLEX      ([facility] LM[facility]) - Abnormal; Notable for the following components:  Clarity, UA Turbid (*)   Protein, UA 4+ (*)   Blood, UA 3+ (*)   Leukocytes, UA 4+ (*)   Nitrite, UA Positive (*)   All other components within normal limits URINE MICROSCOPIC     ([facility] LMW Y[facility]) - Abnormal; Notable for the following components:  WBC/HPF Many (*)   Bacteria, UA Few (*)   Urine Squamous Epithelial Cells, UA Many (*)   All other components within normal limits Radiology studies done in ER: [place] Abdomen Pelvis wo IV Contrast (No Oral)  (Renal Stone) Final Result   1. Moderate obstruction of the right kidney from a 6, right UVJ calculus. 2. Nonobstructing renal calculi. 3. Gallstones with trace pericholecystic fluid could be secondary to the adjacent inflammatory change. Cholecystitis cannot be entirely excluded. 4. Postoperative changes with extensive colonic diverticulosis.  Reported and signed by:  Carolan Clines, MD   XR Chest PA or AP Final Result Limited AP view- No acute abnormality.  [facility] Radiology Notify System Classification: Routine.  Reported and signed by:  Adam Janyth Pupa   Eligha Bridegroom, RN

## 2023-06-27 LAB — COMPREHENSIVE METABOLIC PANEL
BKR A/G RATIO: 0.5
BKR ALANINE AMINOTRANSFERASE (ALT): 7 U/L — ABNORMAL LOW (ref 12–78)
BKR ALBUMIN: 2.3 g/dL — ABNORMAL LOW (ref 3.4–5.0)
BKR ALKALINE PHOSPHATASE: 56 U/L (ref 20–120)
BKR ANION GAP: 7 (ref 5–18)
BKR ASPARTATE AMINOTRANSFERASE (AST): 16 U/L (ref 5–37)
BKR AST/ALT RATIO: 2.3
BKR BILIRUBIN TOTAL: 0.2 mg/dL (ref 0.0–1.0)
BKR BLOOD UREA NITROGEN: 14 mg/dL (ref 8–25)
BKR BUN / CREAT RATIO: 11.4 (ref 8.0–25.0)
BKR CALCIUM: 8.9 mg/dL (ref 8.4–10.3)
BKR CHLORIDE: 115 mmol/L (ref 95–115)
BKR CO2: 21 mmol/L (ref 21–32)
BKR CREATININE: 1.23 mg/dL (ref 0.50–1.30)
BKR EGFR, CREATININE (CKD-EPI 2021): 48 mL/min/{1.73_m2} — ABNORMAL LOW (ref >=60)
BKR GLOBULIN: 4.3 g/dL
BKR GLUCOSE: 94 mg/dL (ref 70–100)
BKR OSMOLALITY CALCULATION: 285 mosm/kg (ref 275–295)
BKR POTASSIUM: 3.8 mmol/L (ref 3.5–5.1)
BKR PROTEIN TOTAL: 6.6 g/dL (ref 6.4–8.2)
BKR SODIUM: 143 mmol/L (ref 136–145)

## 2023-06-27 LAB — CBC WITHOUT DIFFERENTIAL
BKR WAM ANALYZER ANC: 12.21 x 1000/ÂµL — ABNORMAL HIGH (ref 2.00–7.60)
BKR WAM ANALYZER ANC: 19.78 x 1000/ÂµL — ABNORMAL HIGH (ref 2.00–7.60)
BKR WAM HEMATOCRIT (2 DEC): 27.2 % — ABNORMAL LOW (ref 35.00–45.00)
BKR WAM HEMATOCRIT (2 DEC): 30.3 % — ABNORMAL LOW (ref 35.00–45.00)
BKR WAM HEMOGLOBIN: 8.4 g/dL — ABNORMAL LOW (ref 11.7–15.5)
BKR WAM HEMOGLOBIN: 9.2 g/dL — ABNORMAL LOW (ref 11.7–15.5)
BKR WAM MCH (PG): 28.1 pg (ref 27.0–33.0)
BKR WAM MCH (PG): 27.6 pg (ref 27.0–33.0)
BKR WAM MCHC: 30.9 g/dL — ABNORMAL LOW (ref 31.0–36.0)
BKR WAM MCHC: 30.4 g/dL — ABNORMAL LOW (ref 31.0–36.0)
BKR WAM MCV: 91 fL (ref 80.0–100.0)
BKR WAM MCV: 91 fL (ref 80.0–100.0)
BKR WAM MPV: 10.6 fL (ref 8.0–12.0)
BKR WAM MPV: 10.5 fL (ref 8.0–12.0)
BKR WAM PLATELETS: 150 x1000/ÂµL (ref 150–420)
BKR WAM PLATELETS: 163 x1000/ÂµL (ref 150–420)
BKR WAM RDW-CV: 15.2 % — ABNORMAL HIGH (ref 11.0–15.0)
BKR WAM RDW-CV: 15.3 % — ABNORMAL HIGH (ref 11.0–15.0)
BKR WAM RED BLOOD CELL COUNT.: 2.99 M/ÂµL — ABNORMAL LOW (ref 4.00–6.00)
BKR WAM RED BLOOD CELL COUNT.: 3.33 M/ÂµL — ABNORMAL LOW (ref 4.00–6.00)
BKR WAM WHITE BLOOD CELL COUNT: 14 x1000/ÂµL — ABNORMAL HIGH (ref 4.0–11.0)
BKR WAM WHITE BLOOD CELL COUNT: 22.1 x1000/ÂµL — ABNORMAL HIGH (ref 4.0–11.0)

## 2023-06-27 LAB — CBC WITH AUTO DIFFERENTIAL
BKR WAM ABSOLUTE IMMATURE GRANULOCYTES.: 0.08 x 1000/ÂµL (ref 0.00–0.30)
BKR WAM ABSOLUTE LYMPHOCYTE COUNT.: 0.52 x 1000/ÂµL — ABNORMAL LOW (ref 0.60–3.70)
BKR WAM ABSOLUTE NRBC (2 DEC): 0 x 1000/ÂµL (ref 0.00–1.00)
BKR WAM ANALYZER ANC: 12.7 x 1000/ÂµL — ABNORMAL HIGH (ref 2.00–7.60)
BKR WAM BASOPHIL ABSOLUTE COUNT.: 0.02 x 1000/ÂµL (ref 0.00–1.00)
BKR WAM BASOPHILS: 0.1 % (ref 0.0–1.4)
BKR WAM EOSINOPHIL ABSOLUTE COUNT.: 0 x 1000/ÂµL (ref 0.00–1.00)
BKR WAM EOSINOPHILS: 0 % (ref 0.0–5.0)
BKR WAM HEMATOCRIT (2 DEC): 33.5 % — ABNORMAL LOW (ref 35.00–45.00)
BKR WAM HEMOGLOBIN: 10.2 g/dL — ABNORMAL LOW (ref 11.7–15.5)
BKR WAM IMMATURE GRANULOCYTES: 0.6 % (ref 0.0–1.0)
BKR WAM LYMPHOCYTES: 3.8 % — ABNORMAL LOW (ref 17.0–50.0)
BKR WAM MCH (PG): 27.9 pg (ref 27.0–33.0)
BKR WAM MCHC: 30.4 g/dL — ABNORMAL LOW (ref 31.0–36.0)
BKR WAM MCV: 91.8 fL (ref 80.0–100.0)
BKR WAM MONOCYTE ABSOLUTE COUNT.: 0.41 x 1000/ÂµL (ref 0.00–1.00)
BKR WAM MONOCYTES: 3 % — ABNORMAL LOW (ref 4.0–12.0)
BKR WAM MPV: 10.3 fL (ref 8.0–12.0)
BKR WAM NEUTROPHILS: 92.5 % — ABNORMAL HIGH (ref 39.0–72.0)
BKR WAM NUCLEATED RED BLOOD CELLS: 0 % (ref 0.0–1.0)
BKR WAM PLATELETS: 188 x1000/ÂµL (ref 150–420)
BKR WAM RDW-CV: 15.2 % — ABNORMAL HIGH (ref 11.0–15.0)
BKR WAM RED BLOOD CELL COUNT.: 3.65 M/ÂµL — ABNORMAL LOW (ref 4.00–6.00)
BKR WAM WHITE BLOOD CELL COUNT: 13.7 x1000/ÂµL — ABNORMAL HIGH (ref 4.0–11.0)

## 2023-06-27 LAB — ANAEROBIC BLOOD CULTURE IDENTIFICATION PCR (LAB ORDERABLE ONLY) (YH)
BKR CTX-M PCR: NOT DETECTED
BKR KLEBSIELLA PNEUMONIAE GROUP PCR: DETECTED — AB
BKR KPC PCR: NOT DETECTED

## 2023-06-27 LAB — LACTIC ACID, PLASMA (REFLEX 2H REPEAT): BKR LACTATE: 1.6 mmol/L (ref 0.4–2.0)

## 2023-06-27 LAB — PARTIAL THROMBOPLASTIN TIME     (BH GH LMW Q YH)
BKR PARTIAL THROMBOPLASTIN TIME: 34.9 s — ABNORMAL HIGH (ref 23.0–31.0)
BKR PARTIAL THROMBOPLASTIN TIME: 47.1 s — ABNORMAL HIGH (ref 23.0–31.0)
BKR PARTIAL THROMBOPLASTIN TIME: 49.4 s — ABNORMAL HIGH (ref 23.0–31.0)

## 2023-06-27 LAB — C-REACTIVE PROTEIN     (CRP)
BKR C-REACTIVE PROTEIN: 6.5 mg/dL — ABNORMAL HIGH (ref 0.0–1.0)
BKR C-REACTIVE PROTEIN: 17.1 mg/dL — ABNORMAL HIGH (ref 0.0–1.0)

## 2023-06-27 LAB — PROCALCITONIN     (BH GH LMW Q YH): BKR PROCALCITONIN: 28.11 ng/mL — ABNORMAL HIGH

## 2023-06-27 LAB — UA REFLEX CULTURE

## 2023-06-27 LAB — CORTISOL: BKR CORTISOL, PLASMA: 29.9 ug/dL

## 2023-06-27 MED ORDER — NOREPINEPHRINE BITARTRATE 4 MG/250 ML (16 MCG/ML) IN DEXTROSE 5 % IV
4 | INTRAVENOUS | Status: DC
Start: 2023-06-27 — End: 2023-06-28
  Administered 2023-06-27: 09:00:00 4 mL/h via INTRAVENOUS

## 2023-06-27 MED ORDER — CHLORHEXIDINE GLUCONATE 4 % TOPICAL LIQUID
4 | Freq: Every day | TOPICAL | Status: DC
Start: 2023-06-27 — End: 2023-06-28
  Administered 2023-06-27 – 2023-06-28 (×2): 4 mL via TOPICAL

## 2023-06-27 MED ORDER — HEPARIN BOLUS FOR BAG (YNH INTERMITTENT)
Freq: Four times a day (QID) | INTRAVENOUS | Status: DC | PRN
Start: 2023-06-27 — End: 2023-06-29

## 2023-06-27 MED ORDER — NOREPINEPHRINE BITARTRATE 4 MG/250 ML (16 MCG/ML) IN DEXTROSE 5 % IV
4 | INTRAVENOUS | Status: DC
Start: 2023-06-27 — End: 2023-06-27
  Administered 2023-06-27: 07:00:00 4 mL/h via INTRAVENOUS

## 2023-06-27 MED ORDER — LACTATED RINGERS IV BOLUS FROM BAG
INTRAVENOUS | Status: DC
Start: 2023-06-27 — End: 2023-06-27

## 2023-06-27 MED ORDER — ACETAMINOPHEN 325 MG TABLET
325 | Freq: Four times a day (QID) | ORAL | Status: DC | PRN
Start: 2023-06-27 — End: 2023-06-30
  Administered 2023-06-27 – 2023-06-28 (×2): 325 mg via ORAL

## 2023-06-27 MED ORDER — LACTATED RINGERS IV BOLUS (NEW BAG)
Freq: Once | INTRAVENOUS | Status: CP
Start: 2023-06-27 — End: ?
  Administered 2023-06-27: 05:00:00 1000.000 mL/h via INTRAVENOUS

## 2023-06-27 MED ORDER — TRAMADOL 50 MG TABLET
50 | ORAL | Status: DC | PRN
Start: 2023-06-27 — End: 2023-07-01
  Administered 2023-06-27 – 2023-07-01 (×7): 50 mg via ORAL

## 2023-06-27 MED ORDER — LACTATED RINGERS IV BOLUS FROM BAG
INTRAVENOUS | Status: CP
Start: 2023-06-27 — End: ?
  Administered 2023-06-28: 02:00:00 1000.000 mL/h via INTRAVENOUS

## 2023-06-27 MED ORDER — LACTATED RINGERS IV BOLUS (NEW BAG)
Freq: Once | INTRAVENOUS | Status: CP
Start: 2023-06-27 — End: ?
  Administered 2023-06-27: 09:00:00 1000.000 mL/h via INTRAVENOUS

## 2023-06-27 MED ORDER — ETHYL ALCOHOL 62 % TOPICAL SWAB
62 | Freq: Two times a day (BID) | NASAL | Status: DC
Start: 2023-06-27 — End: 2023-06-28
  Administered 2023-06-27 – 2023-06-28 (×3): 62 % via NASAL

## 2023-06-27 MED ORDER — CEFTRIAXONE IV PUSH 1000 MG VIAL & NS (ADULTS)
INTRAVENOUS | Status: DC
Start: 2023-06-27 — End: 2023-06-30
  Administered 2023-06-27 – 2023-06-29 (×3): 10.000 mL via INTRAVENOUS

## 2023-06-27 MED ORDER — POLYETHYLENE GLYCOL 3350 17 GRAM ORAL POWDER PACKET
17 | Freq: Every day | ORAL | Status: DC
Start: 2023-06-27 — End: 2023-07-01
  Administered 2023-06-27 – 2023-07-01 (×5): 17 gram via ORAL

## 2023-06-27 MED ORDER — HEPARIN BOLUS FROM BAG (YNH INTERMITTENT 2)
Freq: Four times a day (QID) | INTRAVENOUS | Status: DC | PRN
Start: 2023-06-27 — End: 2023-06-29

## 2023-06-27 MED ORDER — HEPARIN 25,000 UNIT/250 0.45 % NS - VTE TREATMENT (DVT/PE) (ALL SITES)
25000 | INTRAVENOUS | Status: DC
Start: 2023-06-27 — End: 2023-06-29
  Administered 2023-06-27 – 2023-06-28 (×3): via INTRAVENOUS

## 2023-06-27 NOTE — Other
Post Anesthesia Transfer of Care NotePatient: Danielle A MaloneProcedure(s) Performed: Procedure(s) (LRB):CYSTOURETHROSCOPY, W/INSERTION, INDWELLING URETERAL STENT (Right)Last Vitals: I have reviewed the post-operative vital signs during the handoff as noted in the Epic chart.POSTOP HANDOFF :      Patient Location:  PACU     Level of Consciousness:  Awake, alert and other           Patient @ baseline, answering some questions but not oriented.     VS stable since last recorded intra-op set? Yes       Oxygen source: maskPatient co-morbidities, intra-operative course, intake & output and antibiotics as per Anesthesia record were discussed with the RN.

## 2023-06-27 NOTE — Other
[facility]	Urology Consult NotePatient Data:  Patient Name: Danielle [name] Age: 67 y.o. DOB: 10/10/1956	 MRN: [id number]	 Presentation History: This is a 67 year old female with pmhx DVT (on Eliquis), RA, anemia, perforated diverticulitis s/p R hemicolectomy/ileostomy, non ambulatory, SNF resident who presented to [facility] ED with fever and hypotension. UA +UTI, [place] A/P with 6mm R UVJ stone. Patient seen and examined. She states generally feeling unwell but denies fever, chills, headache, chest pain, SOB, abdominal pain, back/flank pain, dysuria, or hematuria. Physical Exam: Vitals:Last 24 hours: Temp:  [98.6 ?F (37 ?C)-101 ?F (38.3 ?C)] 98.6 ?F (37 ?C)Pulse:  [76-97] 97Resp:  [18-20] 19BP: (85-97)/(45-57) 97/50SpO2:  [95 %-98 %] 96 %Physical ExamGeneral: appears pale, lethargic, able to state name and DOB but not current location or year despite saying she knows. Cardiac: S1 and S2 heardPulm: unlaboredAbdomen: soft, nontender MSK: moving all four extremities spontaneouslyNeuro: following commands appropriately Review of Labs/Diagnostics: Lab Review:Last 24 hours: Recent Results (from the past 24 hour(s)) CBC WITHOUT DIFFERENTIAL  Collection Time: 06/26/23  3:17 PM Result Value Ref Range  WBC 22.2 (H) 4.0 - 11.0 x1000/?L  RBC 4.13 4.00 - 6.00 M/?L  Hemoglobin 11.5 (L) 11.7 - 15.5 g/dL  Hematocrit 95.62 13.08 - 45.00 %  MCV 89.1 80.0 - 100.0 fL  MCH 27.8 27.0 - 33.0 pg  MCHC 31.3 31.0 - 36.0 g/dL  RDW-CV 65.7 (H) 84.6 - 15.0 %  Platelets 228 150 - 420 x1000/?L  MPV 11.0 8.0 - 12.0 fL  ANC(Abs Neutrophil Count) 19.82 (H) 2.00 - 7.60 x 1000/?L Basic metabolic panel  Collection Time: 06/26/23  3:17 PM Result Value Ref Range  Sodium 144 136 - 145 mmol/L  Potassium 3.3 (L) 3.5 - 5.1 mmol/L  Chloride 110 95 - 115 mmol/L  CO2 23 21 - 32 mmol/L  Anion Gap 11 5 - 18  Glucose 80 70 - 100 mg/dL BUN 16 8 - 25 mg/dL  Creatinine 9.62 (H) 9.52 - 1.30 mg/dL  Calcium 84.1 (H) 8.4 - 10.3 mg/dL  BUN/Creatinine Ratio 32.4 8.0 - 25.0  Osmolality Calculation 287 275 - 295 mOsm/kg  eGFR (Creatinine) 43 (L) >=60 mL/min/1.3m2 Lactic acid, plasma (reflex 2h repeat)  Collection Time: 06/26/23  3:21 PM Result Value Ref Range  Lactate 1.9 0.4 - 2.0 mmol/L Protime and INR  Collection Time: 06/26/23  3:21 PM Result Value Ref Range  Prothrombin Time 11.8 9.4 - 12.0 seconds  INR 1.12 0.88 - 1.14 CBC auto differential  Collection Time: 06/26/23  3:21 PM Result Value Ref Range  WBC 19.6 (H) 4.0 - 11.0 x1000/?L  RBC 4.05 4.00 - 6.00 M/?L  Hemoglobin 11.3 (L) 11.7 - 15.5 g/dL  Hematocrit 40.10 27.25 - 45.00 %  MCV 87.9 80.0 - 100.0 fL  MCH 27.9 27.0 - 33.0 pg  MCHC 31.7 31.0 - 36.0 g/dL  RDW-CV 36.6 (H) 44.0 - 15.0 %  Platelets 238 150 - 420 x1000/?L  MPV 10.8 8.0 - 12.0 fL  Neutrophils 90.2 (H) 39.0 - 72.0 %  Lymphocytes 3.9 (L) 17.0 - 50.0 %  Monocytes 4.8 4.0 - 12.0 %  Eosinophils 0.0 0.0 - 5.0 %  Basophil 0.3 0.0 - 1.4 %  Immature Granulocytes 0.8 0.0 - 1.0 %  nRBC 0.0 0.0 - 1.0 %  ANC(Abs Neutrophil Count) 17.68 (H) 2.00 - 7.60 x 1000/?L  Absolute Lymphocyte Count 0.77 0.60 - 3.70 x 1000/?L  Monocyte Absolute Count 0.94 0.00 - 1.00 x 1000/?L  Eosinophil Absolute Count 0.00 0.00 - 1.00 x 1000/?L  Basophil Absolute Count 0.05 0.00 - 1.00 x 1000/?L  Absolute Immature Granulocyte Count 0.15 0.00 - 0.30 x 1000/?L  Absolute nRBC 0.00 0.00 - 1.00 x 1000/?L Comprehensive metabolic panel  Collection Time: 06/26/23  3:21 PM Result Value Ref Range  Sodium 139 136 - 145 mmol/L  Potassium 4.2 3.5 - 5.1 mmol/L  Chloride 112 95 - 115 mmol/L  CO2 22 21 - 32 mmol/L  Anion Gap 5 5 - 18  Glucose 116 (H) 70 - 100 mg/dL  BUN 16 8 - 25 mg/dL  Creatinine 5.40 (H) 9.81 - 1.30 mg/dL  Calcium 19.1 8.4 - 47.8 mg/dL  BUN/Creatinine Ratio 29.5 8.0 - 25.0  Total Protein 8.0 6.4 - 8.2 g/dL  Albumin 2.8 (L) 3.4 - 5.0 g/dL  Total Bilirubin 0.4 0.0 - 1.0 mg/dL  Alkaline Phosphatase 65 20 - 120 U/L  Alanine Aminotransferase (ALT) 10 (L) 12 - 78 U/L  Aspartate Aminotransferase (AST) 21 5 - 37 U/L  Globulin 5.2 g/dL  A/G Ratio 0.5   AST/ALT Ratio 2.1 Reference Range Not Established  Osmolality Calculation 280 275 - 295 mOsm/kg  eGFR (Creatinine) 36 (L) >=60 mL/min/1.6m2 SARS-CoV-2 (COVID-19)/Influenza A+B/RSV by RT-PCR ([facility] LM[facility])  Collection Time: 06/26/23  3:27 PM  Specimen: Nasopharynx; Viral Result Value Ref Range  Influenza A Negative Negative  Influenza B Negative Negative  Respiratory Syncytial Virus Negative Negative  SARS-CoV-2 RNA (COVID-19)  Negative Negative EKG  Collection Time: 06/26/23  3:34 PM Result Value Ref Range  Heart Rate 85 bpm  QRS Interval 88 ms  QT Interval 384 ms  QTC Interval 456 ms  P Axis 6 deg  QRS Axis -15 deg  T Wave Axis 50 deg  P-R Interval 138 msec  SEVERITY Normal ECG severity UA reflex to culture  Collection Time: 06/26/23  5:29 PM  Specimen: Clean Catch (Voided); Urine Result Value Ref Range  Reflex Urine Culture See Comment  Urinalysis with culture reflex     (BH LMW YH)  Collection Time: 06/26/23  5:29 PM  Specimen: Clean Catch (Voided); Urine Result Value Ref Range  Clarity, UA Turbid (A) Clear  Color, UA Yellow Yellow, Colorless  Specific Gravity, UA 1.025 1.005 - 1.030  pH, UA 7.0 5.5 - 7.5  Protein, UA 4+ (A) Negative-Trace  Glucose, UA Negative Negative  Ketones, UA Negative Negative  Blood, UA 3+ (A) Negative  Bilirubin, UA Negative Negative  Leukocytes, UA 4+ (A) Negative  Nitrite, UA Positive (A) Negative  Urobilinogen, UA <2.0 <=2.0 mg/dL Urine microscopic     (BH GH LMW YH)  Collection Time: 06/26/23  5:29 PM Result Value Ref Range  Manual Microscopic Performed   RBC/HPF 0-2 0 - 2 /HPF  WBC/HPF Many (A) 0 - 5 /HPF  Bacteria, UA Few (A) None-Rare /HPF  Urine Squamous Epithelial Cells, UA Many (A) None-Few /HPF  RBC/HPF, UA    WBC/HPF, UA   Diagnostic Review:[place] Abdomen Pelvis wo IV Contrast (No Oral)  (Renal Stone)Result Date: 7/5/2024CT ABDOMEN AND PELVIS WITHOUT CONTRAST: 06/26/2023. CLINICAL HISTORY: Abdominal/flank pain, stone suspected COMPARISON: 06/23/2023, 11/03/2022, 04/05/2022, 02/12/2022, 01/01/2022 AND 12/22/2021. Individual dose optimization technique was used for the performance of this procedure, and one or more of the following dose reduction techniques was used: Automated exposure control and/or adjustment of the MA and/or kV according to patient size and/or the use of iterative reconstruction technique. FINDINGS: The heart is normal size. There are coronary artery calcifications. There is bibasilar atelectasis. The liver, spleen and pancreas are unremarkable.  There are gallstones present. There may be some pericholecystic fluid. The adrenals are unremarkable. There are bilateral nonobstructing renal calculi. There is a 6 mm calculus in the mid zone the right kidney and a 4 mm calculus in the upper zone and lower zone the left kidney. There is been development of mild right hydronephrosis and hydroureter with perinephric and periureteric stranding. There is a 6 mm right UVJ calculus. There is no left hydronephrosis. There is no abdominal aortic aneurysm. There is no abdominal or pelvic adenopathy. There is a track from a prior gastrostomy tube. There are postoperative changes from a right colectomy. There is a right lower quadrant and left lower quadrant ostomy reidentified. There is no bowel dilatation. There is extensive diverticulosis of the remaining colon. There is trace fluid in the right paracolic gutter. Regions of mesenteric nodularity is no longer identified. There is no pneumoperitoneum. 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 There are degenerative changes of the spine and hips.  1. Moderate obstruction of the right kidney from a 6, right UVJ calculus. 2. Nonobstructing renal calculi. 3. Gallstones with trace pericholecystic fluid could be secondary to the adjacent inflammatory change. Cholecystitis cannot be entirely excluded. 4. Postoperative changes with extensive colonic diverticulosis. Reported and signed by:  Carolan Clines, MD XR Chest PA or APResult Date: 7/5/2024AP CHEST: 06/26/2023. CLINICAL HISTORY: Malaise, Sepsis. COMPARISON: 03/09/2023 FINDINGS: Lines/Tubes/Devices: None. Lungs/Pleura: No focal consolidation. No evidence of pulmonary edema. No pleural effusion or pneumothorax. Cardiomediastinal silhouette: Normal. Bones/soft tissues: Unremarkable. Upper Abdomen: Unremarkable. Limited AP view- No acute abnormality. [facility] Radiology Notify System Classification: Routine. Reported and signed by:  Mirian Mo    Impression: This is a 67 year old female with above stated pmhx admitted with urosepsis and R UVJ stone for which Urology was consulted.Recommendations: - NPO- Fluid resuscitation per primary- ABX per ID - Ertapenem- Flomax- Strain all urine- If condition worsens may require a stent to be placed- Dr Hulen Luster following- Hold home Eliquis- Rest of care per primaryPatient and plan discussed with Dr. Bobbye Charleston Signed: Nicki Reaper, PA-C7/04/2023 8:33 PM

## 2023-06-27 NOTE — Plan of Care
Plan of Care Overview/ Patient Status    Problem: InfectionGoal: Absence of Infection Signs and SymptomsOutcome: Interventions implemented as appropriate Problem: Adult Inpatient Plan of CareGoal: Plan of Care Review7/05/2023 1239 by Lajean Silvius, RNFlowsheets (Taken 06/27/2023 1239)Progress: improvingPlan of Care Reviewed With: sibling7/05/2023 1239 by Beaulah Corin, Kaylea Mounsey, RNOutcome: Interventions implemented as appropriateGoal: Patient-Specific Goal (Individualized)Outcome: Interventions implemented as appropriateGoal: Absence of Hospital-Acquired Illness or InjuryOutcome: Interventions implemented as appropriateGoal: Optimal Comfort and WellbeingOutcome: Interventions implemented as appropriateGoal: Readiness for Transition of CareOutcome: Interventions implemented as appropriate Problem: Wound Healing ProgressionGoal: Optimal Wound HealingOutcome: Interventions implemented as appropriate Problem: Violence Risk or ActualGoal: Anger and Impulse ControlOutcome: Interventions implemented as appropriate Problem: Fall Injury RiskGoal: Absence of Fall and Fall-Related InjuryOutcome: Interventions implemented as appropriateReceived patient AOx1,follows simple commands. Bilateral lower extremities contracted, upper extremities stiff but able to move with 3/5 strength. SR on the monitor in the 80's, able to titrated of levophed gtt, BP 104-112/70's. Denies chest pain.Lung clear/diminished , 02sat on 1 liter NC, 95-97%. Denies shortness of breath. 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 Foley draining blood tinged urine, 163mls/hr, flushed with ease. MDs aware of blood tinged urine. Started on cardiac diet, fair/poor appetite noted, patient is a total feed.Heparin gtt infusing, with first and second PTT therapeutic.1600: febrfile to 101.2 tylenol givenUpdated patients sister Charity fundraiser) via phone.

## 2023-06-27 NOTE — H&P
[facility] HealthMedicine Hospitalist Attending - History & PhysicalHistory provided by: EMR review and outside medical record(s) reviewHistory limited by: the condition of the patientDate of Admission: 07/05/24This is the primary Admitting H&P for the patient for this hospital admission.Consults called: Dr. Hulen Luster- UrologySubjective: CHIEF COMPLAINT -    Fever, chills and low BP at [facility]  -  Danielle [name],67 y.o.,female, presents sen tin from NH for low blood pressure, lethargy and fever with chills.  She has been tired since yesterday.Patient recently was hospitalized in the beginning of June for left ureter stone.She also has blood culture positive for ESBL positive E coli and urine culture positive with the same bacteria during last admission.Patient has been diagnosed with a DVT in the beginning of June and is on Eliquis.  However nursing home papers do not give the details, whether she was given the medication today are not.Discussed with the Urology with most likely procedure going to take place overnight.  She is NPOReceived IV fluid boluses in the ED with blood pressure coming up and then again it went down and currently getting further boluses.PAST MEDICAL HISTORY -  History of recurrent sepsis History of ureteric stone with hematuriaHistory of lower GI bleedDVT history on anticoagulation by EliquisRheumatoid arthritisImmunosuppressed statusHyperlipidemiaHistory of perforated diverticulitis status post hemicolectomy/ileostomy-permanentPAST SURGICAL HISTORY  -  Right Hemicolectomy/End-ileostomyBand hemorrhoidectomyLaparoscopic salpingo-oophorectomyPEG placementEGDSOCIAL HISTORY  -  Nonsmoker, no alcohol, no drugsCurrently residing at a nursing homeAs a ileostomy in placeSignificant IADL dependence and IADL dependentFAMILY HISTORY  - Un-elicitable due to patient's mental statusALLERGIES  -  Allergies as of 06/26/2023  (No Known Allergies) HOME MEDICATION LIST  -  Prior to Admission Medications   Medication Sig Last Dose Dispense Doc. Provider  acetaminophen (TYLENOL) 325 mg tablet Take 2 tablets (650 mg total) by mouth every 8 (eight) hours as needed for pain. -- 30 tablet Polisetty, Lakshmi, MD  apixaban (ELIQUIS) 5 mg tablet 1 tablet (5 mg total) by Per G Tube route every 12 (twelve) hours. -- -- Provider, Historical  ferrous gluconate (FERGON) 324 mg (38 mg iron) tablet Take 1 tablet (324 mg total) by mouth every other day. -- -- Tennis Ship, MD  folic acid (FOLVITE) 1 mg tablet Take 1 tablet (1 mg total) by mouth daily. -- 30 tablet Rinaldo Ratel, MD  gabapentin (NEURONTIN) 400 mg capsule Take 1 capsule (400 mg total) by mouth 2 (two) times daily with breakfast and dinner. -- -- Provider, Historical  gabapentin (NEURONTIN) 600 mg tablet Take 1 tablet (600 mg total) by mouth nightly. -- -- Provider, Historical  hydrOXYchloroQUINE (PLAQUENIL) 200 mg tablet Take 1 tablet (200 mg total) by mouth daily. -- 14 tablet Tennis Ship, MD  melatonin 3 mg tablet Take 1 tablet (3 mg total) by mouth nightly. -- -- Tennis Ship, MD  pantoprazole (PROTONIX) 40 mg tablet Take 1 tablet (40 mg total) by mouth daily. -- 30 tablet Rinaldo Ratel, MD  rosuvastatin (CRESTOR) 10 mg tablet 1 tablet (10 mg total) by Per G Tube route nightly. -- -- Provider, Historical  tamsulosin (FLOMAX) 0.4 mg 24 hr capsule Take 1 capsule (0.4 mg total) by mouth nightly. -- 30 capsule Rinaldo Ratel, MD  traMADoL (ULTRAM) 50 mg tablet Take 1 tablet (50 mg total) by mouth daily. -- -- Provider, Historical  traMADoL (ULTRAM) 50 mg tablet Take 1 tablet (50 mg total) by mouth every 8 (eight) hours as needed for pain. -- -- Provider, Historical  ED COURSE -  urology  consult calledHome medication list obtained from:  Nursing home papers. Patient's medications were reviewed and also verified with patient/relative at the bedside. All confirmed medications were reconciled and list updated. Review of Systems Review of Systems Constitutional:  Positive for appetite change, chills, fatigue and fever. HENT:  Negative for congestion.  Respiratory:  Negative for cough and shortness of breath.  Cardiovascular:  Negative for chest pain. Gastrointestinal:  Positive for abdominal pain and nausea. Negative for blood in stool. Genitourinary:  Positive for difficulty urinating, dysuria and flank pain. Musculoskeletal:  Negative for joint swelling. Skin:  Negative for color change. Allergic/Immunologic: Positive for immunocompromised state. Neurological:  Negative for syncope. Psychiatric/Behavioral:  Positive for confusion and dysphoric mood.  All other systems reviewed and are negative.Objective Physical Exam Vitals: BP 97/61  - Pulse (!) 94  - Temp 98.6 ?F (37 ?C) (Oral)  - Resp 19  - SpO2 94% Physical ExamVitals reviewed. Constitutional:     Appearance: She is ill-appearing and toxic-appearing. HENT:    Mouth/Throat:    Mouth: Mucous membranes are dry. Eyes:    General: No scleral icterus.   Extraocular Movements: Extraocular movements intact.    Pupils: Pupils are equal, round, and reactive to light. Cardiovascular:    Rate and Rhythm: Regular rhythm. Tachycardia present.    Pulses: Normal pulses. Pulmonary:    Breath sounds: No rales. Abdominal:    Tenderness: There is no abdominal tenderness. There is right CVA tenderness. Pertinent Labs/Diagnostics Labs: Recent Results (from the past 48 hour(s)) CBC WITHOUT DIFFERENTIAL  Collection Time: 06/26/23  3:17 PM Result Value Ref Range  WBC 22.2 (H) 4.0 - 11.0 x1000/?L  RBC 4.13 4.00 - 6.00 M/?L  Hemoglobin 11.5 (L) 11.7 - 15.5 g/dL  Hematocrit 16.10 96.04 - 45.00 %  MCV 89.1 80.0 - 100.0 fL  MCH 27.8 27.0 - 33.0 pg MCHC 31.3 31.0 - 36.0 g/dL  RDW-CV 54.0 (H) 98.1 - 15.0 %  Platelets 228 150 - 420 x1000/?L  MPV 11.0 8.0 - 12.0 fL  ANC(Abs Neutrophil Count) 19.82 (H) 2.00 - 7.60 x 1000/?L Basic metabolic panel  Collection Time: 06/26/23  3:17 PM Result Value Ref Range  Sodium 144 136 - 145 mmol/L  Potassium 3.3 (L) 3.5 - 5.1 mmol/L  Chloride 110 95 - 115 mmol/L  CO2 23 21 - 32 mmol/L  Anion Gap 11 5 - 18  Glucose 80 70 - 100 mg/dL  BUN 16 8 - 25 mg/dL  Creatinine 1.91 (H) 4.78 - 1.30 mg/dL  Calcium 29.5 (H) 8.4 - 10.3 mg/dL  BUN/Creatinine Ratio 62.1 8.0 - 25.0  Osmolality Calculation 287 275 - 295 mOsm/kg  eGFR (Creatinine) 43 (L) >=60 mL/min/1.19m2 Lactic acid, plasma (reflex 2h repeat)  Collection Time: 06/26/23  3:21 PM Result Value Ref Range  Lactate 1.9 0.4 - 2.0 mmol/L Protime and INR  Collection Time: 06/26/23  3:21 PM Result Value Ref Range  Prothrombin Time 11.8 9.4 - 12.0 seconds  INR 1.12 0.88 - 1.14 CBC auto differential  Collection Time: 06/26/23  3:21 PM Result Value Ref Range  WBC 19.6 (H) 4.0 - 11.0 x1000/?L  RBC 4.05 4.00 - 6.00 M/?L  Hemoglobin 11.3 (L) 11.7 - 15.5 g/dL  Hematocrit 30.86 57.84 - 45.00 %  MCV 87.9 80.0 - 100.0 fL  MCH 27.9 27.0 - 33.0 pg  MCHC 31.7 31.0 - 36.0 g/dL  RDW-CV 69.6 (H) 29.5 - 15.0 %  Platelets 238 150 - 420 x1000/?L  MPV 10.8 8.0 - 12.0 fL  Neutrophils 90.2 (  H) 39.0 - 72.0 %  Lymphocytes 3.9 (L) 17.0 - 50.0 %  Monocytes 4.8 4.0 - 12.0 %  Eosinophils 0.0 0.0 - 5.0 %  Basophil 0.3 0.0 - 1.4 %  Immature Granulocytes 0.8 0.0 - 1.0 %  nRBC 0.0 0.0 - 1.0 %  ANC(Abs Neutrophil Count) 17.68 (H) 2.00 - 7.60 x 1000/?L  Absolute Lymphocyte Count 0.77 0.60 - 3.70 x 1000/?L  Monocyte Absolute Count 0.94 0.00 - 1.00 x 1000/?L  Eosinophil Absolute Count 0.00 0.00 - 1.00 x 1000/?L  Basophil Absolute Count 0.05 0.00 - 1.00 x 1000/?L  Absolute Immature Granulocyte Count 0.15 0.00 - 0.30 x 1000/?L  Absolute nRBC 0.00 0.00 - 1.00 x 1000/?L Comprehensive metabolic panel  Collection Time: 06/26/23  3:21 PM Result Value Ref Range  Sodium 139 136 - 145 mmol/L  Potassium 4.2 3.5 - 5.1 mmol/L  Chloride 112 95 - 115 mmol/L  CO2 22 21 - 32 mmol/L  Anion Gap 5 5 - 18  Glucose 116 (H) 70 - 100 mg/dL  BUN 16 8 - 25 mg/dL  Creatinine 4.78 (H) 2.95 - 1.30 mg/dL  Calcium 62.1 8.4 - 30.8 mg/dL  BUN/Creatinine Ratio 65.7 8.0 - 25.0  Total Protein 8.0 6.4 - 8.2 g/dL  Albumin 2.8 (L) 3.4 - 5.0 g/dL  Total Bilirubin 0.4 0.0 - 1.0 mg/dL  Alkaline Phosphatase 65 20 - 120 U/L  Alanine Aminotransferase (ALT) 10 (L) 12 - 78 U/L  Aspartate Aminotransferase (AST) 21 5 - 37 U/L  Globulin 5.2 g/dL  A/G Ratio 0.5   AST/ALT Ratio 2.1 Reference Range Not Established  Osmolality Calculation 280 275 - 295 mOsm/kg  eGFR (Creatinine) 36 (L) >=60 mL/min/1.1m2 SARS-CoV-2 (COVID-19)/Influenza A+B/RSV by RT-PCR ([facility])  Collection Time: 06/26/23  3:27 PM  Specimen: Nasopharynx; Viral Result Value Ref Range  Influenza A Negative Negative  Influenza B Negative Negative  Respiratory Syncytial Virus Negative Negative  SARS-CoV-2 RNA (COVID-19)  Negative Negative EKG  Collection Time: 06/26/23  3:34 PM Result Value Ref Range  Heart Rate 85 bpm  QRS Interval 88 ms  QT Interval 384 ms  QTC Interval 456 ms  P Axis 6 deg  QRS Axis -15 deg  T Wave Axis 50 deg  P-R Interval 138 msec  SEVERITY Normal ECG severity UA reflex to culture  Collection Time: 06/26/23  5:29 PM  Specimen: Clean Catch (Voided); Urine Result Value Ref Range  Reflex Urine Culture See Comment  Urinalysis with culture reflex     ([facility])  Collection Time: 06/26/23  5:29 PM  Specimen: Clean Catch (Voided); Urine Result Value Ref Range  Clarity, UA Turbid (A) Clear  Color, UA Yellow Yellow, Colorless  Specific Gravity, UA 1.025 1.005 - 1.030  pH, UA 7.0 5.5 - 7.5  Protein, UA 4+ (A) Negative-Trace  Glucose, UA Negative Negative  Ketones, UA Negative Negative  Blood, UA 3+ (A) Negative  Bilirubin, UA Negative Negative  Leukocytes, UA 4+ (A) Negative  Nitrite, UA Positive (A) Negative  Urobilinogen, UA <2.0 <=2.0 mg/dL Urine microscopic     ([facility])  Collection Time: 06/26/23  5:29 PM Result Value Ref Range  Manual Microscopic Performed   RBC/HPF 0-2 0 - 2 /HPF  WBC/HPF Many (A) 0 - 5 /HPF  Bacteria, UA Few (A) None-Rare /HPF  Urine Squamous Epithelial Cells, UA Many (A) None-Few /HPF  RBC/HPF, UA    WBC/HPF, UA   DiagnosticsCT Abdomen Pelvis wo IV Contrast (No Oral)  (Renal  Stone) Final Result   1. Moderate obstruction of the right kidney from a 6, right UVJ calculus. 2. Nonobstructing renal calculi. 3. Gallstones with trace pericholecystic fluid could be secondary to the adjacent inflammatory change. Cholecystitis cannot be entirely excluded. 4. Postoperative changes with extensive colonic diverticulosis.  Reported and signed by:  Carolan Clines, MD   XR Chest PA or AP Final Result Limited AP view- No acute abnormality.  [facility] Notify System Classification: Routine.  Reported and signed by:  Mirian Mo   CARDIAC EKG RESULT SCAN Final Result  EKG: I HAVE REVIEWED THE EKG.  NORMAL SINUS RHYTHM AT 85/MINUTE, NORMAL AXIS,No acute ST/T changes in concordant leads suggestive of cardiac ischemia seen. Assessment: Danielle [name],67 y.o.,female is admitted with ACTIVE ACUTE DIAGNOSES   Complicated UTIRight Pelvic ureteric stone with obstructive uropathy and right hydronephrosisSeptic ShockUTI with ESBL+ E.ColiGram negative bacteremiaHypotensionAcute encephalopathy- metabolicAKIModerate dehydrationImmunosuppressed statusCHRONIC ONGOING ISSUES    DVT history on EliquisAnemiaRheumatoid arthritisHx of ruptured diverticulitis and hemoperitoneumIDAHx of recurrent sepsisPlan: PULMONARY  -       respiratory status is stable.  Nasal oxygen p.r.n.ID -     discuss with IDSince she had ESBL positive E coli in blood and urine recently, the ideal choice would be a ertapenemStarted on ertapenem 1st dose being given nowFollow urine and blood culturesNext likely patient has Gram-negative bacteremiaCheck profile and CRPCARDIAC -     patient initially had a rise in blood pressure and then the blood pressure fell againLactated Ringer 2 L bolus to be given right nowPatient transferred to ICA unitNo need for pressors at this timeShall keep a close eye on the blood pressureHEME/ONC -  will do type and crossmatchShe has been on Eliquis DVT diagnosed in the beginning of the monthDiscussed with urology and as she is going to the OR will start heparin drip after the procedureMETABOLIC/RENAL/ENDOCRINE -    IV fluidsUrology consulted.  For OR today for ureteric stentingCheck electrolytes and replete accordinglyIntake output chartingGI -    colostomy careAntiemetics p.r.n.NEURO/PSYCH -    supportive careDVT PROPHYLAXIS -    start heparin drip after the procedureOTHERS -    noneDIET -    NPO now for procedureCOMMUNICATION WITH PATIENT/FAMILY: The whole process of the pathology of the  patient's current issues and the management steps were discussed in detail with the patient/family and they exhibited complete understanding. I have discussed the patients code status: YESWith:  Conservator- ReneeCODE STATUS:  Full code Advance Care Planning Patient has a living will: yesPatient has healthcare representative/proxy: yesRenee McCulleyPlease enter code [id number] if patient answers Yes or code [id number] if patient answers No to any of the above questions and you are not completing a 30 minute or more Advanced Care Planning encounter Diet:  NPODVT PPX: Heparin drip after procedureEmergency Contact:  [name]- conservatorDispo:  Will need more than 2 nights of hospital stayTELEMETRY:    Currently needs Telemetry at admission. To be reviewed after 24 hours.       Time Spent for the admission process = 90 minutes. More than 50% of that time was spent in coordinating care including discussing plans for overall treatment and f/u with patient.Notifications: PCP: Kendall Flack    Primary Care Provider was notified of this admission. NoFamily was notified of this admission. YesPlan discussed with patient and/or family. Yes====H&P was routed to PCP in EPICCurrent Facility-Administered Medications Medication Dose Route Frequency Last Rate  [START ON 06/27/2023]  enoxaparin prophylaxis dosing  40 mg Subcutaneous Daily    [START  ON 06/27/2023] ertapenem  1,000 mg Intravenous Q24H    lactated ringers (new bag)  1,000 mL Intravenous Once 1,000 mL (06/26/23 2109)  lactated ringers (new bag)  1,000 mL Intravenous Once    lactated ringers  125 mL/hr Intravenous Continuous    ondansetron  4 mg Oral Q6H PRN    [START ON 06/27/2023] polyethylene glycol  17 g Oral Daily    sodium chloride  3 mL IV Push Q8H    sodium chloride  3 mL IV Push PRN for Line Care    [Held by provider] tamsulosin  0.4 mg Oral Nightly    PATIENT WAS ADMITTED ON: 06/26/23- Carmela Hurt, MD    Pager# [phone]         Hospitalist       Diplomate-[employer]7/5/20249:10 PM(Disclaimer: Portions of this document may have been transcribed using speech recognition software. Despite attempts at proofreading this document may contain transcription errors. Please do not hesitate to contact this writer to request clarification.)

## 2023-06-27 NOTE — Plan of Care
Admission Note Nursing Danielle [name] is a 67 y.o. female admitted with a chief complaint of  low BP, chills and fever. Patient arrived from  Patient is  Orientation: disoriented to;situation;time;placeLevel of Consciousness: alert;confused Vitals:  06/27/23 0202 06/27/23 0215 06/27/23 0230 06/27/23 0300 BP: (!) 89/50 (!) 89/48 (!) 87/46 (!) 71/47 Pulse: (!) 95 (!) 94 (!) 94 90 Resp:  20  18 Temp:     TempSrc:     SpO2: 98% 95% 96% 96% Oxygen therapy Oxygen TherapySpO2: 96 %Device (Oxygen Therapy): nasal cannulaO2 Flow (L/min): 5Current Facility-Administered Medications Medication Dose Route Frequency Provider Last Rate Last Admin  chlorhexidine gluconate (HIBICLENS/BETASEPT) 4 % topical liquid   Topical (Top) Daily [name], [name], DO   Given at 06/27/23 0981  ertapenem Pincus Sanes) 1,000 mg in sodium chloride 0.9% 50 mL (mini-bag plus)  1,000 mg Intravenous Q24H Nicki Reaper, PA      ethyl alcohol 62 % nasal swab 1 Swab  1 Swab Nasal Q12H [name], Miguel Angel, DO      gabapentin (NEURONTIN) capsule 400 mg  400 mg Oral BID WC Gillern, Larisa, PA      gabapentin (NEURONTIN) tablet 600 mg  600 mg Oral Nightly Gillern, Larisa, PA      lactated ringers bolus from bag 1,000 mL  1,000 mL Intravenous Bolus from Bag [name], [employer], DO      polyethylene glycol (MIRALAX) packet 17 g  17 g Oral Daily [name], Miguel Angel, DO      sodium chloride 0.9 % flush 3 mL  3 mL IV Push Q8H Gillern, Larisa, PA   3 mL at 06/26/23 2118  .I have reviewed patient valuables Belongings charted in last 7 days:  Comments:See flowsheets, patient education and plan of care for additional information. Problem: Adult Inpatient Plan of CareGoal: Plan of Care ReviewOutcome: Interventions implemented as appropriateGoal: Patient-Specific Goal (Individualized)Outcome: Interventions implemented as appropriateGoal: Absence of Hospital-Acquired Illness or InjuryOutcome: Interventions implemented as appropriateGoal: Optimal Comfort and WellbeingOutcome: Interventions implemented as appropriateGoal: Readiness for Transition of CareOutcome: Interventions implemented as appropriate Plan of Care Overview/ Patient Status    Assumed care [id number] alert and oriented x2, disoriented x2. Pt came from PACU. S/P stent placement. Tele, NSR. HR=90s-100s. BP=95/61; BP=86/49. Dr. Berkley Harvey made aware pt's BP=74/44. 020am: Heparin gtt started per [facility]. Ileostomy bag changed. Loose stool. No bleeding noted. Foley catheter, patent, red color urine. Call bell within reach. Bed alarm on. [name], RN

## 2023-06-27 NOTE — Utilization Review (ED)
67 yo female sent in from nursing home for hypotension and fever, febrile 101F, hypotension, leukocytosis 19.6 with left shift, + urinalysis, IV fluids x 2L, Morphine, Rocephin. Flomax. UM Status: Meets Inpatient Danielle [name], MSRNUtilization Review Specialist

## 2023-06-27 NOTE — Anesthesia Pre-Procedure Evaluation
This is a 67 y.o. female scheduled for CYSTOURETHROSCOPY, W/INSERTION, INDWELLING URETERAL STENT (Right: Ureter).Review of Systems/ Medical HistoryPatient summary, nursing notes, EKG/Cardiac Studies , Labs, pre-procedure vitals, height, weight and NPO status reviewed.No previous anesthesia concernsAnesthesia Evaluation:   No history of anesthetic complications  Estimated body mass index is 18.8 kg/m? as calculated from the following:  Height as of 06/15/23: 5' 10 (1.778 m).  Weight as of 06/15/23: 59.4 kg. CC/HPI: 67 y/o F from NH with sepsis secondary to obstruction ureteral stone. Cardiovascular: Negative      -Exercise tolerance: >4 METS -Vascular Disease:  Negative    Respiratory:  Negative.HEENT: Negative.Neuromuscular:    Patient has a history of decreased mental status.-Comments: Paraplegia, DysphagiaSkeletal/Skin:  -Joint and Skeletal Disorders: arthritisGastrointestinal/Genitourinary: -Gastrointestinal Disorders:  Patient has GERD.-Nutritional Disorders: Pt is cachectic per BMI definition, (BMI <20).-Comments: NephrolithiasisHematological/Lymphatic: -Anemia: Patient has anemia.  Endocrine/Metabolic:  Negative.Behavioral/Social/Psychiatric & Syndromes: NegativePhysical ExamCardiovascular:      Cardiovascular Exam DeferPulmonary:    Exam deferredAirway:  Mallampati: IITM distance: >3 FBNeck ROM: fullDental:  Dentition: dentition poor and missingAnesthesia PlanASA 3 - emergent The primary anesthesia plan is  general LMA. Perioperative Code Status confirmed: It is my understanding that the patient is currently designated as 'Full Code' and will remain so throughout the perioperative period.Anesthesia informed consent obtained. Consent obtained from: otherUse of blood products: consented  Consent Comment: I attempted to call the patient's conservator, [name] @ [phone] but was unable to reach her. I was able to reach the patient's brother, Joana Reamer and obtained consent from him.The post operative pain plan is per surgeon management.Anesthesiologist's Pre Op NoteI personally evaluated and examined the patient prior to the intra-operative phase of care on the day of the procedure.Marland Kitchen

## 2023-06-27 NOTE — Brief Op Note
Brief Operative NotePost-Op Diagnosis Codes:   * Ureteral stone [N20.1]   * Sepsis (HC Code) [Q03.9]Surgeon(s) and Role:   [name], MD - PrimaryProcedure(s) :Procedure(s) (LRB):CYSTOURETHROSCOPY, W/INSERTION, INDWELLING URETERAL STENT Bilaterally., Extraction of Stone, Removal foreign body. (Bilateral)Procedure:    CYSTOURETHROSCOPY, W/INSERTION, INDWELLING URETERAL STENT Bilaterally., Extraction of Stone, Removal foreign body.CPT(R) Code:  47425 - PR CYSTOSCOPY,INSERT URETERAL STENTOperative Procedure Findings: passed r uretral stone Left mid urteral stone Estimated Blood Loss:  Pathology/Specimens:Comments (if necessary):

## 2023-06-27 NOTE — Other
Adult RRT EvaluationRRT called at 1:15 for low BP.S - Pt feeling better O -Temp:  [98.6 ?F (37 ?C)-101 ?F (38.3 ?C)] 100.1 ?F (37.8 ?C)Pulse:  [76-100] 94Resp:  [18-20] 20BP: (74-133)/(40-100) 80/40SpO2:  [94 %-98 %] 97 %Device (Oxygen Therapy): nasal cannulaO2 Flow (L/min):  [3-6] 3CV S1, S2, No m/r/gLungs CTAB Abdomen soft, NTNDOriented x3EKG - N/ACXR - N/AA/P- 67 yo female originally admitted for ureteral stent placement and E coli bacteremia required RRT for low BP. Pt denying other symptoms now and reports feeling better. Last echo in march. Given 4L fluids in ED. Pt placed in trendelenburg and BP repeated. Consider ICU transfer if pt doesn't improve with fluids.Orders placed for- 1L LR bolus- CBC to check H/H to r/o bleeding given recent surgery- Lactate-Attending provider Attending Provider: Aviva Signs, MD [phone] notified of the above plan-RRT debriefing form filled out and given to RNThis patient does meet the following criteria for sepsis/septic shock: 	Suspected or documented infection? yes	2 or more SIRS criteria? yes		HR >90		Temp >100.4 or <96.8		RR >20		WBC >12,000 or <4,000 or >10% bands		Acute AMS	1 or more signs of organ dysfunction? yes		SBP <90 or decrease by >40 mmHg from baseline		Lactate >2		Creatinine >2 or increase by >0.5 above baseline		Total bilirubin >4 (if not chronically elevated)		Platelets <100,000 (if not chronically thrombocytopenic)		INR >1.5 or PTT >60 (if not on coumadin)If the patient meets the above criteria, call a SEPSIS ALERT and complete the Sepsis Alert template (.ghsepsisalert)Alim Cattell, MD1:32 AM

## 2023-06-27 NOTE — ED Notes
Put patient on telemetry monitor.  Called tele monitor and confirmed they see patient which they do.

## 2023-06-27 NOTE — Other
[facility] Tele-ICU			[facility] HealthCrystal [id number]/1/1957Attending Physician: Gilda Crease, MDPatient Location: [facility] [address] RDMEDICAL/SURGICAL ICUImpression and Plan: 53 woman, admitted to the hospital with urosepsis likely due to ureteral stoneS/p cystourethroscopy with with stent.Post procedure became hypotensive with BPs refractory to several liters of crystalloid boluses.Transferred to the MICU.Appears well, in no distress. No shortness of breath. Lying flat on room air.On norepinephrine infusion. Making urine. Lactate 1.6.Agree with broad antibiotics until more culture data available.Avoid large IV fluid boluses. Instead attempt half a liter LR boluses at a time with frequent reassessment of respiratory status. Attestation: This patient is critically ill as indicated by the following: Septic Shock with Severe Sepsis.Real time two-way audio-visual technology used to visualize the patient and key medical equipment? YesThe patient has required  30 minutes of my undivided attention during the past 24 hours for one of the previously noted life threatening diagnoses.Participants at the Bedside:resident Role -: Patient assessmentMuhammad Shelbie Ammons, MD7/6/20245:43 AMInSight [address] [place], [place] [address]

## 2023-06-27 NOTE — Plan of Care
Plan of Care Overview/ Patient Status    Incentive Spirometry (IS) provided and explained to patient.  and provided instructions. No respiratory distress noted. CTM

## 2023-06-27 NOTE — Anesthesia Post-Procedure Evaluation
Anesthesia Post-op NotePatient: Danielle A MaloneProcedure(s):  Procedure(s) (LRB):CYSTOURETHROSCOPY, W/INSERTION, INDWELLING URETERAL STENT (Right) Last Vitals:  I have reviewed the post-operative vital signs as noted in the Epic chart.POSTOP EVALUATION:      Patient Recovery Location:  PACU     Vital Signs Status:  Stable     Patient Participation:  Patient participated     Mental Status:  Awake and alert     Respiratory Status:  Acceptable     Airway Patency:  Patent     Cardiovascular/Hydration Status:  Stable     Pain Management:  Satisfactory to patient     Nausea/Vomiting Status:  Satisfactory to patientThere were no known notable events for this encounter.

## 2023-06-27 NOTE — Progress Notes
CC bilateral stent placementHPICrystal A [name] is a 67 y.o. female  status post bilateral stent placement for urosepsisPatient had passed a right ureteral stone has not asymptomatic left ureteral stonePatient more alert todayUrine blood-tingedHPI Past Medical HistoryPast Medical History: Diagnosis Date  Acute embolism and thrombosis of unspecified femoral vein (HC Code)   Anemia   Cellulitis and abscess of right lower extremity   Dysphagia   Encephalopathy   GERD (gastroesophageal reflux disease)   Hypercalcemia   Paraplegia (HC Code)   Rheumatoid arthritis (HC Code)  Past Surgical HistoryPast Surgical History: Procedure Laterality Date  BAND HEMORRHOIDECTOMY    CYSTECTOMY  1972  LAPAROSCOPIC SALPINGOOPHERECTOMY  1986  LAPAROTOMY  2022  PARTIAL COLECTOMY  2022  PEG TUBE PLACEMENT  01/2022  UPPER GASTROINTESTINAL ENDOSCOPY   AllergiesNo Known AllergiesMedications@[email or web address] @ Social HistorySocial History Tobacco Use  Smoking status: Never  Smokeless tobacco: Never  Family History No family history on file.Review of SystemsNo symptoms beyond those noted in HPI and PMH- all other systems are negativePhysical ExamBP 107/61  - Pulse 84  - Temp 97.9 ?F (36.6 ?C) (Oral)  - Resp (!) 21  - Ht 5' 10 (1.778 m)  - Wt 66.2 kg  - SpO2 94%  - BMI 20.94 kg/m? Lab Results Component Value Date  WBC 22.1 (H) 06/27/2023  HGB 9.2 (L) 06/27/2023  HCT 30.30 (L) 06/27/2023  PLT 163 06/27/2023  ALT 7 (L) 06/27/2023  AST 16 06/27/2023  NA 143 06/27/2023  K 3.8 06/27/2023  CL 115 06/27/2023  CREATININE 1.23 06/27/2023  BUN 14 06/27/2023  CO2 21 06/27/2023  TSH 0.527 03/09/2023  INR 1.12 06/26/2023  GLU 94 06/27/2023  HGBA1C 5.5 03/09/2023   No results found for: UCOLOR, UGLUCOSE, UKETONE, USPECGRAVITY, UPH, UPROTEIN, UNITRATES, UBLOOD, ULEUKOCYTES, UA  No results found for: PVRPOCT Previous resultsNo results found for: TESTOSTERONE AUA Symptom Score today:      No data to display     PATHOLOGY AND BIOPSIESTEST RESULTSImagesDISCUSSIONFL OR Urography RetrogradeResult Date: 06/27/2023 Intraoperative fluoroscopy was utilized for bilateral retrograde injections and bilateral ureteral stent placements. Reported and signed by:  Carolan Clines, MD [place] Abdomen Pelvis wo IV Contrast (No Oral)  (Renal Stone)Result Date: 06/26/2023 1. Moderate obstruction of the right kidney from a 6, right UVJ calculus. 2. Nonobstructing renal calculi. 3. Gallstones with trace pericholecystic fluid could be secondary to the adjacent inflammatory change. Cholecystitis cannot be entirely excluded. 4. Postoperative changes with extensive colonic diverticulosis. Reported and signed by:  Carolan Clines, MD XR Chest PA or APResult Date: 7/5/2024Limited AP view- No acute abnormality. Danielle [name], [place] Rad[facility] Notify System Classification: Routine. Reported and signed by:  Mirian Mo US Duplex Lower Extremity Venous BilatResult Date: 06/19/2023 No DVT of the right or left lower extremity. [name] Radiology Notify System Classification: Routine. Reported and signed by:  Lonia Chimera, MD [place] Abdomen Pelvis w IV Contrast (No Oral)Result Date: 05/24/2023  1. Extensive colonic diverticulosis without [place] evidence for diverticulitis. No definite contrast extravasation is seen to identify a source of bleeding. 2. Nonobstructing right renal calculi. Delayed left nephrogram with a new 5 mm calculus projecting at the level of the left iliac crest highly suspicious for a left ureteral calculus Dr. Alena Bills discussed these findings with [name], PA at 2:56 AM. I discussed these findings with Dr. Rinaldo Ratel at 9:30 AM. Radiology Notify System Classification: Clinical team aware. Individual dose optimization technique was used for the performance of this procedure, and one or more of the following dose reduction techniques was used: Automated exposure  control and/or adjustment of the MA and/or kV according to patient size and/or the use of iterative reconstruction technique. Reported and signed by:  Geroge Baseman, MD CXR portableResult Date: 03/09/2023  No active cardiopulmonary abnormalities.  No change. [facility] [employer] [name]: Routine. Reported and signed by: Amie Critchley, MD   Assessment & Plan:Danielle [name] is a 68 y.o. female patient off pressors blood pressure returning to normalUrine culture is pendingUrine is blood-tinged [place] as expected on blood thinnersCan remove Foley as per MedicineWill follow as needed but will require outpatient treatment@[email or web address] @Orders  Placed This Encounter Procedures  Blood culture  SARS-CoV-2 (COVID-19)/Influenza A+B/RSV by RT-PCR ([facility] LMW Y[facility])  Urine culture  Urine culture  Anaerobic Blood Culture Identification PCR (Lab Orderable Only)(YH)  XR Chest PA or AP  [place] Abdomen Pelvis wo IV Contrast (No Oral)  (Renal Stone)  FL OR Urography Retrograde  Lactic acid, plasma (reflex 2h repeat)  Protime and INR  Urinalysis with culture reflex (obtain straight cath if patient unable to provide sample)  CBC auto differential  Comprehensive metabolic panel  UA reflex to culture  Urinalysis with culture reflex     ([facility] LMW Y[facility])  Urine microscopic     ([facility] GH LMW Y[facility])  Magnesium  CBC auto differential  Procalcitonin     (BH GH LMW Q YH)  C-reactive protein (CRP)  Stone analysis  CBC No diff  Urinalysis-macroscopic w/reflex microscopic  Partial thromboplastin time  Partial thromboplastin time  Partial thromboplastin time  Cortisol  CBC No differential  Partial thromboplastin time  Lactic acid, plasma (reflex 2h repeat)  Partial thromboplastin time  Comprehensive metabolic panel  C-reactive protein (CRP)  Hemoglobin and hematocrit, blood  Partial thromboplastin time     (BH GH LMW Q YH)  Diet Cardiac  Sepsis Alert  Strain all urine  Discontinue PACU Orders  Perform CHG bath - Coordinate with nurse and documentation on MAR  Full Code  Contact isolation status  Incentive Spirometry  CARDIAC EKG RESULT SCAN  CARDIAC MISC.  RESULT SCAN  EKG  Type and screen  ED Admit to Inpatient  Transfer patient  Transfer patient  Transfer patient  CBC and differential  Comprehensive metabolic panel  CBC and Differential  Comprehensive metabolic panel  Place in ED Observation No follow-ups on file.Patient will let us know if any new problems or symptoms arise in the interimNo name on file

## 2023-06-27 NOTE — Progress Notes
[facility]	MICU Progress NoteInterim History: This is hospital day 2 for this patient with the following issues: septic shock.  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  Admitted with f/c and mild R hydro with 6mm stone, s/p stent placement last night.  Hypotensive ON despite 5 liters IVF, transferred to ICU for vasopressor support via PIV.  Inflam markers ok, lactate 1.6.  Awake and alert, making adequate UOP.  Mental status ok, NAD, ? baseline dysfunction.  Hematuria via foley reported improved.Data: Drips:Norepi down to 0.02HeparinKVOVent Settings: Mode:   Tidal Volume:  Rate:   FiO2:   PEEP:   ABG: SaO2 94% 1 lpmECG/Tele Events: Telemetry: NSRHemodynamics:CVP:   SpO2:   SVR: 	SVV:      StO2: I/O's:Gross Totals (Last 24 hours) at 06/27/2023 1116Last data filed at 06/27/2023 1100Intake 6065.92 ml Output 1950 ml Net 4115.92 ml Vitals:O2 Sat and Device used: SpO2: 95 %Device (Oxygen Therapy): nasal cannula and Vitals Min & Max: BP  Min: 71/47  Max: 133/100Temp  Avg: 99.4 ?F (37.4 ?C)  Min: 97.9 ?F (36.6 ?C)  Max: 101 ?F (38.3 ?C)Pulse  Avg: 86.1  Min: 72  Max: 100Resp  Avg: 17  Min: 14  Max: 21SpO2  Avg: 96.6 %  Min: 92 %  Max: 100 %Height  Avg: 5' 10 (177.8 cm)  Min: 5' 10 (177.8 cm)  Max: 5' 10 (177.8 cm)Weight  Avg: 66.2 kg  Min: 66.2 kg  Max: 66.2 kgNursing Data:I have reviewed all of the relevant nursing documentation.Medications: SCHEDULED: Current Facility-Administered Medications Medication Dose Route Frequency Provider Last Rate Last Admin  cefTRIAXone (ROCEPHIN) 1 g in sodium chloride 0.9% PF 10 mL (100 mg/mL)  1 g IV Push Q24H Nedra Hai, Soo-Hyun, MD      chlorhexidine gluconate (HIBICLENS/BETASEPT) 4 % topical liquid   Topical (Top) Daily Launa Grill, DO Given at 06/27/23 1610  ethyl alcohol 62 % nasal swab 1 Swab  1 Swab Nasal Q12H Launa Grill, DO   1 Swab at 06/27/23 0847  gabapentin (NEURONTIN) capsule 400 mg  400 mg Oral BID WC Nicki Reaper, PA   400 mg at 06/27/23 0847  gabapentin (NEURONTIN) tablet 600 mg  600 mg Oral Nightly Gillern, Larisa, PA      polyethylene glycol (MIRALAX) packet 17 g  17 g Oral Daily [name], Miguel Angel, DO   17 g at 06/27/23 0846  sodium chloride 0.9 % flush 3 mL  3 mL IV Push Q8H Gillern, Ashok Norris, PA   3 mL at 06/26/23 2118 INFUSIONS:  heparin 25,000 units/250 ml infusion - VTE TREATMENT (DVT/PE) 18 Units/kg/hr (06/27/23 1100)  norepinephrine Stopped (06/27/23 0945) Exam: AAOX2Breath sounds bilaterally, no crackles, no wheezesNormal S1S2Abdomen is soft, not distendedExtremities have no edemaStudies: Labs:Recent Labs   07/05/241517 07/05/241521 07/06/240639 NA 144 139 143 K 3.3* 4.2 3.8 CL 110 112 115 CO2 23 22 21  BUN 16 16 14  CREATININE 1.34* 1.54* 1.23 GLU 80 116* 94 Recent Labs   07/05/241521 07/05/242200 07/06/240138 07/06/240639 WBC 19.6* 13.7* 14.0* 22.1* HGB 11.3* 10.2* 8.4* 9.2* HCT 35.60 33.50* 27.20* 30.30* PLT 238 188 150 163  Recent Labs   07/05/241521 07/06/240138 07/06/240808 PTT  --  34.9* 47.1* INR 1.12  --   --  Diagnostics:CXR: normalAssessment/Plan: I have reviewed the patient's problem list and updated it as needed.Pulm:  Stable.  No indication fluid overloadCV: Hemodynamically improved.  H/H stable but  decreased, continue to trend today as decrease may be related to fluids given.  Taper Norepi to off for MAP >65.  Hx DVT, DOAC held, continue heparin note hematuria improving (s/p stent).  DVT dx 2/23 by [place] abd, if bleeding persists or H/H declines will hold ac.  Per report no steroids >3 months (for RA), will hold on stress dose steroid for now.GI: No active issues.  Ileostomy looks good, note perf tic with resection was 11/22Neuro: Per report pt has had FTT and cognitive deficiency since abd surgery 2022.  Has been LTAC and SNF since.  Not ambulatory given this and advanced RA.  Now awake and no distress, she does demonstrate some cognitive impairment which may be chronic.ID:  Antibiotics ceftriaxone and tobra.  BC positive gnr, no id yet.  Hx ESBL noted, as d/w ID hold ertapenem for now.  Clinically improving, will taper off pressors this AM.  CRP and WBC are up.  Lactate improved.  Low threshold to start carbapenem as we wait for ID gnr.  Holding Plaquenil.  FEN: AKI, creat improving and adequate UOP.  S/P R ureter stent for obstructing stone ON.  Hematuria improving this AM.Prophylaxis:  SCD, heparin. OOB.Critical Care Attestation: This patient is critically ill as indicated by the following: Septic Shock with Severe Sepsis.The patient has required 60 minutes of my undivided attention during the past 24 hours for one of the previously noted life threatening diagnoses. This time is exclusive of the time devoted to procedures.Signed:Lonita Debes Luna Fuse  Beeper: [id number]

## 2023-06-27 NOTE — Other
Operative Diagnosis:Pre-op:   Right ureteral stone [N20.1] Patient Coded Diagnosis   Pre-op diagnosis: Right ureteral stone  Post-op diagnosis: Right ureteral stone  Patient Diagnosis   Pre-op diagnosis: Right ureteral stone [N20.1]  Post-op diagnosis:     Post-op diagnosis:   * Right ureteral stone [N20.1]Operative Procedure(s) :Procedure(s) (LRB):CYSTOURETHROSCOPY, W/INSERTION, INDWELLING URETERAL STENT (Right)Post-op Procedure & Diagnosis ConfirmationPost-op Diagnosis: Post-op Diagnosis updated (see notes)     - Bilateral ureteral stones with sepsisPost-op Procedure: Post-op Procedure updated (see notes)     - Bilateral ureteral stent and stone extractionAnesthesia ClarifiersCysto/Renal:  Transurethral procedures including urethrocystoscopy including fragmentation, manipulation and/or removal ureteral calculus NOS

## 2023-06-27 NOTE — Significant Event
[facility] Hospitalist Attending NotePatient underwent stent placement or in the right kidney with removal of the calculus.Postoperatively patient developed hypotension with shaky chills.However her mental status has improvedShe had received a total of 4 L of IV fluids and is getting the 5 L bolus nowDiscussed with ICU attendingIf blood pressure stays low despite this 1 L bolus then patient will be transferred to ICU for pressor support-Currently blood pressure stayed low despite IV fluids and so patient has been transferred to the ICU- Carmela Hurt, MD    Pager# [phone]                          Hospitalist  Diplomate-American Board of Internal Medicine and Obesity Medicine 7/6/20243:04 AM

## 2023-06-27 NOTE — Progress Notes
MICU Progress NotePatient: Danielle A MaloneDate: 06/27/2023, hospital day 1  History limited by: Fransico Him 67 yo female with PMH of DVT on Eliquis, RA, anemia, perforated diverticulitis November 10, 2021 s/p hemicolectomy/ileostomy coming from nursing home presented to the hospital with sepsis of urinary origin symptoms of fever, hypotension. Admitted to ICU for septic shock and blood pressure management.ICU course: Patient received 5L IV fluid, but BP was unresponsive. Started on levophed overnight with 0.1, and off of levophed currently. BP is normal 108/69. 24 hour events: No acute events overnight. Patient was on levophed 0.1 last night, and is currently off levophed. Output 200 ml of light bloody urine from foley. Subjective: This morning, patient is resting comfortably on her bed. Denies chest pain, SOB, cough, fever, headache, nausea, vomiting, abdominal pain, change in urinary or bowel movement change. Objective: Telemetry: sinus rhythm Ventilator:  No results for input(s): PHART, PCO2ART, PO2ART, HCO3ART in the last 72 hours. Infusions:  heparin 25,000 units/250 ml infusion - VTE TREATMENT (DVT/PE) 18 Units/kg/hr (06/27/23 0630)  norepinephrine 0.06 mcg/kg/min (06/27/23 0630) Physical ExamVitals reviewed. Constitutional:     Appearance: She is comfortable, non distress.HENT:    Mouth/Throat:    Mouth: Mucous membranes are moist. Eyes:    General: No scleral icterus.   Extraocular Movements: Extraocular movements intact.    Pupils: Pupils are equal, round, and reactive to light. Cardiovascular:    Rate and Rhythm: Regular rhythm.   Pulses: Normal pulses. Pulmonary:    Breath sounds: No rales. Abdominal:    Tenderness: There is no abdominal tenderness. Ileostomy bag presents with green secretion and air. Neuro AOx3PHYSICAL EXAM:Vitals: BP (!) 92/58  - Pulse 77  - Temp 97.9 ?F (36.6 ?C) (Oral)  - Resp 14  - Ht 5' 10 (1.778 m)  - Wt 66.2 kg  - SpO2 96%  - BMI 20.94 kg/m? Constitutional: Well appearing, well-developed, in no acute distress. HEENT: Normocephalic, atraumatic. Pupils equal and reactive to light bilaterally. Extraocular movements intact. No scleral icterus or conjunctival injection. Oropharynx clear without exudate or erythema, mucous membranes moist, no mouth sores. CV:  Regular rate and rhythm. Normal S1, S2;  No murmurs, rubs or gallops. Respiratory: Clear to auscultation bilaterally. No wheezes, rales or rhonchi. GI: Soft, non-tender, non-distended to palpation. Normoactive bowel sounds. Extremities: No pitting peripheral edema. MSK: Normal ROM, no spinal or paraspinal tenderness. Neurologic: no facial asymmetry or dysarthria present. CN 2-12 grossly normal as tested. Patient refuse to talk. Unable to assess patient's alert awareness and orientation. Psychiatric: Normal mood and affectSkin: No rash or lesions[Auto-loaded objective data below: 24h vitals, labs, current medications]Objective Vitals:Temp:  [97.9 ?F (36.6 ?C)-101 ?F (38.3 ?C)] 97.9 ?F (36.6 ?C)Pulse:  [75-100] 77Resp:  [14-20] 14BP: (71-133)/(40-100) 92/58SpO2:  [92 %-99 %] 96 % Gross Totals (Last 24 hours) at 06/27/2023 0657Last data filed at 06/27/2023 0630Intake 5979.97 ml Output 1100 ml Net 4879.97 ml  LABS:Recent Labs Lab 07/05/241517 07/05/241521 NA 144 139 K 3.3* 4.2 CL 110 112 CO2 23 22 BUN 16 16 CREATININE 1.34* 1.54* GLU 80 116* Recent Labs Lab 07/05/241517 07/05/241521 07/05/242200 07/06/240138 WBC 22.2* 19.6* 13.7* 14.0* HGB 11.5* 11.3* 10.2* 8.4* HCT 36.80 35.60 33.50* 27.20* PLT 228 238 188 150 Recent Labs Lab 07/05/241521 ALKPHOS 65 AST 21 ALT 10* BILITOT 0.4 ALBUMIN 2.8*  Recent Labs Lab 07/05/241521 07/06/240138 LABPROT 11.8  --  INR 1.12  --  PTT  --  34.9* RECENT IMAGING: FL OR Urography RetrogradeResult Date: 06/27/2023 Intraoperative fluoroscopy was utilized for bilateral retrograde injections and  bilateral ureteral stent placements. Reported and signed by:  Carolan Clines, MD [place] Abdomen Pelvis wo IV Contrast (No Oral)  (Renal Stone)Result Date: 06/26/2023 1. Moderate obstruction of the right kidney from a 6, right UVJ calculus. 2. Nonobstructing renal calculi. 3. Gallstones with trace pericholecystic fluid could be secondary to the adjacent inflammatory change. Cholecystitis cannot be entirely excluded. 4. Postoperative changes with extensive colonic diverticulosis. Reported and signed by:  Carolan Clines, MD XR Chest PA or APResult Date: 7/5/2024Limited AP view- No acute abnormality. [facility] Radiology Notify System Classification: Routine. Reported and signed by:  Mirian Mo  MICRO:CURRENT MEDICATIONS:  heparin 25,000 units/250 ml infusion - VTE TREATMENT (DVT/PE) 18 Units/kg/hr (06/27/23 0630)  norepinephrine 0.06 mcg/kg/min (06/27/23 0630) Current Facility-Administered Medications Medication Dose Route Frequency Provider Last Rate Last Admin  chlorhexidine gluconate (HIBICLENS/BETASEPT) 4 % topical liquid   Topical (Top) Daily [name], [name], DO   Given at 06/27/23 0981  ertapenem Pincus Sanes) 1,000 mg in sodium chloride 0.9% 50 mL (mini-bag plus)  1,000 mg Intravenous Q24H Nicki Reaper, PA      ethyl alcohol 62 % nasal swab 1 Swab  1 Swab Nasal Q12H [name], Miguel Angel, DO      gabapentin (NEURONTIN) capsule 400 mg  400 mg Oral BID WC Gillern, Larisa, PA      gabapentin (NEURONTIN) tablet 600 mg  600 mg Oral Nightly Gillern, Larisa, PA      polyethylene glycol (MIRALAX) packet 17 g  17 g Oral Daily [name], Miguel Angel, DO      sodium chloride 0.9 % flush 3 mL  3 mL IV Push Q8H Gillern, Ashok Norris, PA   3 mL at 06/26/23 2118  acetaminophen  650 mg Oral Q6H PRN  heparin (porcine)  80 Units/kg Intravenous Q6H PRN  And  heparin (porcine)  40 Units/kg Intravenous Q6H PRN  ondansetron  4 mg Oral Q6H PRN  sodium chloride  3 mL IV Push PRN for Line Care  Assessment & Plan: In summary, 67 y.o. female with PMHx of DVT on Eliquis, RA, anemia, perforated diverticulitis November 10, 2021 s/p hemicolectomy/ileostomy, presented to the hospital with sepsis of urinary origin. Admitted to the ICU for septic shock and pressor support. NEUROLOGY: / PSYCH:- NAI- Continue Gabapentin for Neuropathy - Holding Tramadol PULMONOLOGY:#Hypoxia/Apnea- Satting 99% on 1L NC- try to wane off oxygen. - clear lung auscultation, clear chest x-ray, no signs of fluid overload. - nasal oxygen prn- Incentive spirometry to prevent atelectasis  CARDIOVASCULAR: #Septic Shock- MAP goal > 65 - Current BP 108/69 (trending up) - On levophed 0.1 --> 0.06 --> 0.02 (06/27/23 7am) --> off levophed- Continue monitor on patient's vitals.  HEMATOLOGY:#Hematuria#DVT#Anemia- DVT diagnosed at beginning of month, on eliquis- On Heparin drip for DVT prophylaxis - Hold Eliquis for now- No blood on ileostomy drainage- Observe for any signs of bleeding, monitor vitals - Monitor CBC q12h- Low Hgb 8.4 when first admitted to ICU, likely due to 5L of IV fluids that were given to her. - Current Hgb 9.2 trending up  GASTROENTEROLOGY: #Ileostomy bag in place- colostomy care- observe for any infection- no signs of infection observed- antiemetics prn NEPHROLOGY: #Ureteral Stone s/p stent placement#AKI-Had ureteral stent placed by Urology-Check electrolytes and replete accordingly -Monitor I/P, currently on foley- Light bloody color urine-Trend Serum Creatinine- Avoid nephrotoxic agents INFECTIOUS DISEASE: #Sepsis likely 2/2 to UTI c/n ureteral stone obstruction s/p stent#Septic Shock#Gram Negative Bacteremia- ESBL positive E coli in blood and urine in March- continue Ceftriaxone and Tobramycin - follow urine and blood  cultures- blood culture pending, gram stain positive for anaerobic gram negative rod - urine culture pending - Negative viral panel. - Trend fever curve ENDOCRINOLOGY/Rheumatology:#Rheumatoid Arthritis- Continue PTA Plaquenil (Hydroxychloroquine) 200 mg- Patient last time on steroids was April 2024.  GOALS OF CARE:Care Representative [name], [place] (Conservator) Diet, Enteral Access: Regular dietDisposition: ICUElectrolytes: replenish PRN; goal K > 4, Mg > 2Devices, Lines: PIV, foley Prophylaxis: Heparin. SCDCode Status: Full CodeExcellent nursing care appreciated. Patient status and plan discussed with intensivist, Dr. Lynnell Chad. Electronically signed by: Dia Crawford, DO Internal Medicine, PGY-1Available on MHB7/05/2023 6:57 AM

## 2023-06-27 NOTE — Other
2307 assuming care at this time. Pt received from the OR via hospital  bed/. Lethargic but arousable/ slightly confused but redirectable. Denies pain.hooked to the cardiac monitor. Vs Stable  at this time. Slightly febrile. Iv line patent and intact ivf ongoing on and infusing well. Abdonmen soft non ditended. Colostomy noyted - no drainage noted.at this time. Foley cathter in pace pinkish urine noted. No clots noted. Scsd placed2325 more arousable. [id number] tylenol iv started. Ice chips provided. Tolerated well. Brother at the bedside. Pt awake and conversant. [id number] more awake tolerated ice chips denies pain

## 2023-06-27 NOTE — Plan of Care
Plan of Care Overview/ Patient Status    CM spoke with patient's conservator Danielle [name], introduced self and role. She verbalized understanding. Initial CM Screening completed with positive for CM Evaluation and Planning outcome. Pt will need Care Coordination on discharge. Pt is admit with Dx   kidney stone Patient is long term resident of [facility]. Pt is bed bound. Danielle [name] would like patient to be transferred to [facility] when medically stable. Referral sent. CM will continue to follow and assist with discharge planning Case Management will take lead to arrange post -acute care services and continue to work with the care team as the patient progresses towards discharge. Case Management Screening and Evaluation  Flowsheet Row Most Recent Value Case Management Screening: Chart review completed. If YES to any question below then proceed to CM Eval/Plan  Is there a change in their cognitive function Yes Do you anticipate that the pt will have any discharge needs requiring CM intervention? Yes Has there been a readmission within the last 30 days and/or four (4) encounters (encounters include: ED, OBS, Inpatient) within the last six (6) months? Yes Were there services prior to admission ( Examples: Assisted Living, HD, Homecare, Extended Care Facility, Methadone, SNF, Outpatient Infusion Center) Yes Negative/Positive Screen Positive Screening: Complete CM Evaluation and Plan Case Manager Attestation  I have reviewed the medical record and completed the above screen. CM staff will follow patient's progress and discuss the plan of care with the Treatment Team. Yes Case Management Evaluation and Plan  Arrived from prior to admission SNF (receiving long term care) Admitted from: [facility]  Lives With Other (see comments) Services Prior to Admission other (see comment) Patient Requires Care Coordination Intervention Due To discharge planning needs/concerns Prior to Hospitalization: Assistance Needed/DME being used Ambulation Documented Insurance Accurate Yes Any financial concerns related to anticipated discharge needs No Patient's home address verified Yes Patient's PCP of record verified Yes Last Date Seen by PCP 0-3 months Case Manager Attestation  I have reviewed the medical record and completed the above evaluation with the following recommendations. Yes Discharge Planning Coordination Recommendations  Discharge Planning Coordination Recommendations Skilled Nursing Facility for long term care Case Manager reviewed plan of care/ continuum of care need's with  Family   Danielle [name] G. Thalia Bloodgood RN BSN [employer] [facility]  [place],  [place] [address]

## 2023-06-27 NOTE — Plan of Care
Plan of Care Overview/ Patient Status    Transfer Note Nursing Danielle [name] is a 67 y.o. female transferred from [facility] unit with a chief complaint of hypotension.  Vitals:  06/27/23 0315 06/27/23 0330 06/27/23 0345 06/27/23 0400 BP: (!) 78/46 (!) 86/56 (!) 84/49 (!) 91/52 Pulse: (!) 91 85 88 81 Resp: 17 16 14 14  Temp:     TempSrc:     SpO2: 95% 97% 98% 99%  I have reviewed the patient's current medication orders.I have reviewed patient valuables Comments: Received patient from [facility], alert and fairly oriented, calm and cooperative. SBP 70s-80s, MAPs<65. Levo gtt initiated per [name] MD with good effect, MAPs>65 maintained. Received on 6L NC and titrated to 1L NC with SpO2 97-98%. On RA SpO2 92% while sleeping and placed on 1L NC. Afebrile. 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 Foley in place with bloody urine, MD aware. Heparin gtt continued at 18 units/kg/hr (hx DVT). Per MD will continued heparin gtt as long as H/H is stable but will continue to monitor for further signs of bleeding. Wound to L side abdomen with dressing in place. Patient is oriented to unit and verbalizes understanding. Fall precaution in place, call light within reach and rounds continued for safety.

## 2023-06-27 NOTE — Other
0115am RRT called pt's BP=74/44. S/P stent placement. Pt came from PACU around 12am:RRT team came at bedsidePt alert and oriented x2, able to follow some commands. Pt stated I am feeling better. LR 1L bolus ordered per MAR. Labs orderedContinue to monitor VS.Kalese Ensz, RN BP (!) 91/52  - Pulse 81  - Temp (!) 100.1 ?F (37.8 ?C) (Temporal)  - Resp 14  - SpO2 99%

## 2023-06-27 NOTE — Plan of Care
Problem: Adult Inpatient Plan of CareGoal: Plan of Care Review7/05/2023 [phone] by [name], RNOutcome: Interventions implemented as appropriate7/05/2023 0138 by [name], RNOutcome: Interventions implemented as appropriateGoal: Patient-Specific Goal (Individualized)06/27/2023 0333 by [name], RNOutcome: Interventions implemented as appropriate7/05/2023 0138 by [name], RNOutcome: Interventions implemented as appropriateGoal: Absence of Hospital-Acquired Illness or Injury7/05/2023 0333 by [name], RNOutcome: Interventions implemented as appropriate7/05/2023 0138 by [name], RNOutcome: Interventions implemented as appropriateGoal: Optimal Comfort and Wellbeing7/05/2023 0333 by [name], RNOutcome: Interventions implemented as appropriate7/05/2023 0138 by [name], RNOutcome: Interventions implemented as appropriateGoal: Readiness for Transition of Care7/05/2023 0333 by [name], RNOutcome: Interventions implemented as appropriate7/05/2023 0138 by [name], RNOutcome: Interventions implemented as appropriate TRANSFER OUT NOTEASSESSMENTNeuro: Cognitive/Neuro/Behavioral WDL: .WDL except;orientationCardiac: Cardiac WDL: cardiac monitorTelemetry: 	[x]  Yes		[]  NoRespiratory:Respiratory WDL: .WDL except;breath soundsGastrointestinal: GI WDL: WDLGenitourinary: Genitourinary WDL: .WDL except;female symptoms;urineElimination:   []  Independent	[]  Commode	[]  Bedpan/Urinal  []  Straight Cath		    Foley cath 	[x]   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	Other (specify):Skin Alteration: []  Pressure Injury []  Wound []  None Ambulation:     []  Independent []  Cane   []  Walker	[]  Wheelchair       	               []  Bedbound     []  Hemiplegic []  Paraplegic []  Quadraplegic Dietary Orders (From admission, onward)     Start     Ordered  06/26/23 1906  Diet NPO Effective Now  DIET EFFECTIVE NOW      Question Answer Comment NPO: meds with sips  Initiate Nutrition Management Protocol (Yes/No?) Yes - Initiate Procotol    06/26/23 1905     Medication Administration Special Directions:  IV Access: [x]  PIV   []  PICC    []  Port    [] Central line    []  A-line    Other (specify)Outstanding Meds/Treatments/Tests: Safety Precautions: [x]  None	[]  Sitter   []  Restraints	[]  Suicidal                      	  []  Fall Risk	 Other (specify):TRANSFER INFORMATION Patient Belongings:No valuables with patient at this time Belongings charted in last 7 days: Is someone taking belongings home?   [x]  No     []  Yes  Who? (specify) _________Transferred to ICU  (unit) for Change in condition/acuity .Equipment/Device: OxygenEscorted IO:[id number] NurseVitals:  06/27/23 0202 06/27/23 0215 06/27/23 0230 06/27/23 0300 BP: (!) 89/50 (!) 89/48 (!) 87/46 (!) 71/47 Pulse: (!) 95 (!) 94 (!) 94 90 Resp:  20  18 Temp:     TempSrc:     SpO2: 98% 95% 96% 96%  Verbal report given to Christina,RN (name) Report considered of patient's Situation, Background, Assessment, and RecommendationOpportunity for questions and clarification was providedPlan of Care Overview/ Patient Status

## 2023-06-28 ENCOUNTER — Encounter: Admit: 2023-06-28 | Payer: PRIVATE HEALTH INSURANCE | Attending: Urology | Primary: Nephrology

## 2023-06-28 LAB — HEMOGLOBIN AND HEMATOCRIT, BLOOD
BKR WAM HEMATOCRIT (2 DEC): 31.7 % — ABNORMAL LOW (ref 35.00–45.00)
BKR WAM HEMATOCRIT (2 DEC): 30.9 % — ABNORMAL LOW (ref 35.00–45.00)
BKR WAM HEMOGLOBIN: 9.6 g/dL — ABNORMAL LOW (ref 11.7–15.5)
BKR WAM HEMOGLOBIN: 9.5 g/dL — ABNORMAL LOW (ref 11.7–15.5)

## 2023-06-28 LAB — COMPREHENSIVE METABOLIC PANEL
BKR A/G RATIO: 0.5
BKR ALANINE AMINOTRANSFERASE (ALT): 7 U/L — ABNORMAL LOW (ref 12–78)
BKR ALBUMIN: 2.2 g/dL — ABNORMAL LOW (ref 3.4–5.0)
BKR ALKALINE PHOSPHATASE: 60 U/L (ref 20–120)
BKR ANION GAP: 6 (ref 5–18)
BKR ASPARTATE AMINOTRANSFERASE (AST): 11 U/L (ref 5–37)
BKR AST/ALT RATIO: 1.6
BKR BILIRUBIN TOTAL: 0.1 mg/dL (ref 0.0–1.0)
BKR BLOOD UREA NITROGEN: 13 mg/dL (ref 8–25)
BKR BUN / CREAT RATIO: 12.7 (ref 8.0–25.0)
BKR CALCIUM: 9 mg/dL (ref 8.4–10.3)
BKR CHLORIDE: 115 mmol/L (ref 95–115)
BKR CO2: 22 mmol/L (ref 21–32)
BKR CREATININE: 1.02 mg/dL (ref 0.50–1.30)
BKR EGFR, CREATININE (CKD-EPI 2021): 60 mL/min/{1.73_m2} (ref >=60)
BKR GLOBULIN: 4.5 g/dL
BKR GLUCOSE: 99 mg/dL (ref 70–100)
BKR OSMOLALITY CALCULATION: 285 mosm/kg (ref 275–295)
BKR POTASSIUM: 3.3 mmol/L — ABNORMAL LOW (ref 3.5–5.1)
BKR PROTEIN TOTAL: 6.7 g/dL (ref 6.4–8.2)
BKR SODIUM: 143 mmol/L (ref 136–145)

## 2023-06-28 LAB — CBC WITHOUT DIFFERENTIAL
BKR WAM ANALYZER ANC: 10.68 x 1000/ÂµL — ABNORMAL HIGH (ref 2.00–7.60)
BKR WAM HEMATOCRIT (2 DEC): 30.9 % — ABNORMAL LOW (ref 35.00–45.00)
BKR WAM HEMOGLOBIN: 9.5 g/dL — ABNORMAL LOW (ref 11.7–15.5)
BKR WAM MCH (PG): 27.3 pg (ref 27.0–33.0)
BKR WAM MCHC: 30.7 g/dL — ABNORMAL LOW (ref 31.0–36.0)
BKR WAM MCV: 88.8 fL (ref 80.0–100.0)
BKR WAM MPV: 10.9 fL (ref 8.0–12.0)
BKR WAM PLATELETS: 156 x1000/ÂµL (ref 150–420)
BKR WAM RDW-CV: 15.5 % — ABNORMAL HIGH (ref 11.0–15.0)
BKR WAM RED BLOOD CELL COUNT.: 3.48 M/ÂµL — ABNORMAL LOW (ref 4.00–6.00)
BKR WAM WHITE BLOOD CELL COUNT: 12.8 x1000/ÂµL — ABNORMAL HIGH (ref 4.0–11.0)

## 2023-06-28 LAB — PT/INR AND PTT (BH GH L LMW YH)
BKR INR: 1.08 (ref 0.88–1.14)
BKR PARTIAL THROMBOPLASTIN TIME: 39.5 s — ABNORMAL HIGH (ref 23.0–31.0)
BKR PROTHROMBIN TIME: 11.4 s (ref 9.4–12.0)

## 2023-06-28 LAB — PARTIAL THROMBOPLASTIN TIME     (BH GH LMW Q YH)
BKR PARTIAL THROMBOPLASTIN TIME: >139 s — CR (ref 23.0–31.0)
BKR PARTIAL THROMBOPLASTIN TIME: 48.1 s — ABNORMAL HIGH (ref 23.0–31.0)

## 2023-06-28 LAB — URINE CULTURE: BKR URINE CULTURE, ROUTINE: >=100000 — AB

## 2023-06-28 MED ORDER — POTASSIUM CHLORIDE ER 20 MEQ TABLET,EXTENDED RELEASE(PART/CRYST)
20 | Freq: Once | ORAL | Status: CP
Start: 2023-06-28 — End: ?
  Administered 2023-06-28: 10:00:00 20 MEQ via ORAL

## 2023-06-28 NOTE — Other
Pt arrived from ICU as transfer. She is AAOx4, on RA. Heparin drip infusing at 18 units/kg/hr. Foley in place with blood-tinged urine. MD was made aware this morning. All safety and comfort measures implemented at this time. RTM on and showing NSR. WCTM at this time.

## 2023-06-28 NOTE — Transfer Summaries
I have spoken with ICU attending Dr Lynnell Chad and he agrees that the patient can be transferred out of the ICU at this time. 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 Admitted to ICU initially for pressor support. Patient is currently on CTX for klebsiella + urine and blood cx, sensitive to CTX. BP stable off pressor. Patient will continue under house-staff care in team 2 House-staff will sign off once patient is out of the ICU. Please contact me or the on-call senior resident with any questions.  Electronically Signed:Soo-Hyun Nedra Hai, MD PGY-2Date: 7/7/2024Time: 10:56 AM

## 2023-06-28 NOTE — Other
Explained to Dr. Ander Slade from primary team about patient's latest PTT of 39.5. Pt has had consecutive therapeutic PTTs despite the one from this morning which was supratherapeutic, which may have been erroneous due to lab having been drawn lab from same side as drip infusing. Pt was having daily PTT checks at this point, but to be proactive, I decided to have her PTT rechecked when she arrived to tele floor. Level is 39.5, and per protocol order set, pt is to receive bolus and have rate/dose increased. Verified this information with Dr. Ander Slade, and she states to not administer bolus at this time and to continue at same rate as now. Therefore, will not make any changes to her current rate/dose at this time of 18 units/kg/hr. Will resume daily PTT checks.

## 2023-06-28 NOTE — Plan of Care
Problem: Physical Therapy GoalsGoal: Physical Therapy GoalsDescription: PT GOALS1. Patient will perform bed mobility with minimal assist2. 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 Admitted to ICU initially for pressor support, no in Telemetry, PT eval completed   Subjective I cant do it, lot of pain   Referring Physician  Dr Katrinka Blazing   General Observations Pt presented in bed, noted both knees flexed,she was able to follow simple commands for exercises, inc weakness on both UE/LE.   Precautions/Limitations Fall Precautions   Precautions/Limitations Comment mechanical lift for transfers, reporting inc pain on both LEs with minimal movt.    Weight Bearing Status  Weight Bearing Status Comments unable to tolerate standing    Prior Level of Functioning/Social History  Additional Comments Pt lives in [facility] as [facility] resident. Pt reports she gets OOB daily sits in wheelchair, usually require a person to transfer OOB to chair. Pt reports she does not walk    Vital Signs and Orthostatic Vital Signs  Vital Signs --   During Treatment BP 98/60   During Treatment HR 72   During Treatment SpO2 (%) 94   During Treatment O2 Delivery room air    Pain/Comfort  Pain Comment (Pre/Post Treatment Pain) Pt reporting generalized pain on both LEs, most on both knees. Noted knee contracture, pain with stretching    Patient Coping  Observed Emotional State afraid/fearful   Additional Patient Coping Comments Pt hesistant to do more with PT due to severe pain on both knees with movt    Cognition  Overall Cognitive Status Within Functional Limits    Range of Motion  Range of Motion Comments B knees flexion contracture, pain with movement or stretching, pt refused to have LEs touched by PT    Musculoskeletal  LUE Muscle Strength Grading 2-->active movement with gravity eliminated   RUE Muscle Strength Grading 2-->active movement with gravity eliminated   LLE Muscle Strength Grading 2-->active movement with gravity eliminated   RLE Muscle Strength Grading 2-->active movement with gravity eliminated   Musculoskeletal Comments Pt had difficulty achieving full range on both sh flexion. Pt with inc weakness. pain on both LEs, mostly on both knees,    Muscle Tone  Muscle Tone Testing Results No muscle tone deficits noted    Coordination  Coordination Comments Guarded with movement on both LEs, inspite assistance given. Pt refused PT to do ROM on both LEs    Sensory Assessment  Sensory Assessment Comments sensitive to pain on both LEs    Skin Assessment  Skin Assessment See Nursing Documentation    Balance  Sitting Balance: Static  POOR      Moderate assist to maintain static position with no Assistive Device   Sitting Balance: Dynamic  POOR-    Unable to move from midline voluntarily, maximal assist to right self   Balance Skills Training Comment max  assist to sit at EOB    Bed Mobility  Rolling/Turning Right - Independence/Assistance Level Maximum assist   Rolling/Turning Left - Independence/Assistance Level Maximum assist   Rolling/Turning Assist Device Bed rails   Supine-to-Sit Independence/Assistance Level Maximum assist   Supine-to-Sit Assist Device Head of bed elevated   Sit-to-Supine Independence/Assistance Level Maximum assist   Sit-to-Supine Assist Device Bed rails   Bed mobility was limited by: pain   Bed Mobility Comments pt hesistant to participate more with mobility training as it aggravates pain on both LEs    Sit-Stand Transfer Training  Sit-to-Stand Transfer Independence/Assistance Level Unable to assess    Handoff Documentation  Handoff Patient in bed;Bed alarm   Handoff Comments Limited mobility requiring max assist for repositioning.  Pain is the limiting factor    Activity Tolerance  Activity Tolerance Comments Poor due to pain on both LEs    PT- AM-PAC - Basic Mobility Screen- How much help from another person do you currently need.....  Turning from your back to your side while in a a flat bed without using rails? 2 - A Lot - Requires a lot of help (maximum to moderate assistance). Can use assistive devices.   Moving from lying on your back to sitting on the side of a flat bed without using bed rails? 2 - A Lot - Requires a lot of help (maximum to moderate assistance). Can use assistive devices.   Moving to and from a bed to a chair (including a wheelchair)? 1 - Total - Requires total assistance or cannot do it at all.   Standing up from a chair using your arms(e.g., wheelchair or bedside chair)? 1 - Total - Requires total assistance or cannot do it at all.   To walk in a hospital room? 1 - Total - Requires total assistance or cannot do it at all.   Climbing 3-5 steps with a railing? 1 - Total - Requires total assistance or cannot do it at all.   AMPAC Mobility Score 8   TARGET Highest Level of Mobility Mobility Level 3, Sit on edge of bed   ACTUAL Highest Level of Mobility Mobility Level 3, Sit on edge of bed    Therapeutic Exercise  Therapeutic Exercise Comments AAROM on both UEs,, AAROM to PROM on both LEs, pain on both knees repositioned for comfort    Clinical Impression  Rehab Diagnosis Impaired Mobility   Initial Assessment Pt is a 67 yo female, dx with Sepsis, just transferred out of ICU, now in telemetry, presented with pain on both LEs, generalized weakness, requiring max assist for basic mobility, require use of hoyer lift for OOB transfer. LTC resident in [place] , wheelchair bound at baseline but transfers OOB with 1 person assist. Pt currently has inc pain on both LEs. Max assist to sit pt at EOB, tolerated 15 seconds, pt wanting to get back to bed, minimally tolerated ROM on both LEs due to pain, unable to move full range against gravity due to weakness. Pt will need cont PT, assist in repositioning.   Prognosis Fair   Criteria for Skilled Therapeutic Interventions Met yes;treatment indicated   Pathology/Pathophysiology Noted (Describe Specifically for Each System) musculoskeletal   Impairments Found (describe specific impairments) mobility;muscle strength;aerobic capacity/endurance;pain   Rehab Potential good, to achieve stated therapy goals    Patient/Family Stated Goals  Patient/Family Stated Goal(s) return to prior level of functioning    Frequency/Equipment Recommendations  PT Frequency 3x per week   Equipment Needs During Admission/Treatment [facility]  bed    PT Recommendations for Inpatient Admission  Activity/Level of Assist assist of 2;mechanical lift   ADL Recommendations assist of 2;mechanical lift   Positioning reposition frequently   Therapeutic Exercise encourage exercise program issued    Education - Learning Assessment   Education Topic Functional mobility techniques;Exercise program;Energy conservation;Disease process;Therapy role/rehab process;Discharge planning;Assistance/guarding   Learners Patient   Readiness Eager   Method Explanation   Response Needs Reinforcement    Planned Treatment / Interventions  Plan for Next Visit Strength trng mobility progression   General Treatment / Interventions Therapeutic Exercise   Training Treatment / Interventions Functional Mobility Training   Education Treatment / Interventions Patient Education / Training   Planned Treatment/Interventions Comments safe DC planning    PT Discharge Summary  Physical Therapy Disposition Recommendation Moderate complexity support and therapy to progress functional mobility/ ADLs/ IADLs recommended for post- acute care.  See assessment for additional details.   Equipment Recommendations for Discharge Hoyer lift     Emilee Hero  PT, DPTMH [phone] of Care Overview/ Patient Status

## 2023-06-28 NOTE — Transfer Summaries
MICU Progress NotePatient: Danielle A MaloneDate: 06/28/2023, hospital day 2  History limited by: NoneHospital Course:  67 year old female with PMHx of recurrent sepsis, DVT on apixaban, rheumatoid arthritis on hydroxychloroquine, anemia, perforated diverticulitis s/p R hemicolectomy/ileostomy, non-ambulatory, presented from long-term care facility with fever and hypotension, admitted to the ICU for management of septic shock secondary to bilateral ureteral stones s/p cystourethoscopy with stent placement requiring vasopressor support.  She initially required low-dose Levophed and was able to be weaned off.  Patient has remained hemodynamically stable, although blood pressure remains soft.24 hour events: Overnight, patient's BP decreased to 80s/50s, with MAPs<65.  Received 250 mL LR with appropriate response.  BPs have remained stable, however soft. Subjective: This morning, patient examined while resting in bed.  Reports feeling overall improved. Denies fever, chills, shortness of breath, flank pain.  Foley continues to put out blood-tinged urine.Objective: Telemetry: Sinus rhythmInfusions:  heparin 25,000 units/250 ml infusion - VTE TREATMENT (DVT/PE) 18 Units/kg/hr (06/28/23 0746) PHYSICAL EXAM:Vitals: BP (!) 96/55  - Pulse 74  - Temp (!) 100.2 ?F (37.9 ?C) (Axillary)  - Resp 14  - Ht 5' 10 (1.778 m)  - Wt 65.6 kg  - SpO2 98%  - BMI 20.75 kg/m? Constitutional:  Chronically ill appearing female, lying in bed, in no acute distressHEENT: Pupils equal and reactive to light bilaterally. Extraocular movements intact. No scleral icterus or conjunctival injection. CV:  RRR. Normal S1, S2;  No murmurs, rubs or gallops. Respiratory: Clear to auscultation bilaterally. No wheezes, rales or rhonchi. GI: Soft, non-distended, mild late tender in the suprapubic region to deep palpation.  Ileostomy in place in right lower quadrant with greenish output in bag.Extremities: No peripheral edema. DP pulses intactMSK: Upper and lower extremities contracted bilaterally.  Neurologic:  Alert and oriented, at patient baseline. No facial asymmetry or dysarthria present. CN 2-12 grossly normal as tested. Psychiatric: Normal mood and affectSkin: No rash or lesionsObjective Vitals:Temp:  [97.9 ?F (36.6 ?C)-101.2 ?F (38.4 ?C)] 100.2 ?F (37.9 ?C)Pulse:  [68-119] 74Resp:  [14-26] 14BP: (86-121)/(53-76) 96/55SpO2:  [92 %-99 %] 98 % Gross Totals (Last 24 hours) at 06/28/2023 1141Last data filed at 06/28/2023 0746Intake 735.21 ml Output 2650 ml Net -1914.79 ml  LABS:Recent Labs Lab 07/05/241517 07/05/241521 07/06/240639 07/07/240538 NA 144 139 143 143 K 3.3* 4.2 3.8 3.3* CL 110 112 115 115 CO2 23 22 21 22  BUN 16 16 14 13  CREATININE 1.34* 1.54* 1.23 1.02 GLU 80 116* 94 99 Recent Labs Lab 07/05/241517 07/05/241521 07/05/242200 07/06/240138 07/06/240639 07/06/242004 07/07/240538 WBC 22.2* 19.6* 13.7* 14.0* 22.1*  --  12.8* HGB 11.5* 11.3* 10.2* 8.4* 9.2* 9.6* 9.5* - 9.5* HCT 36.80 35.60 33.50* 27.20* 30.30* 31.70* 30.90* - 30.90* PLT 228 238 188 150 163  --  156 Recent Labs Lab 07/05/241521 07/06/240639 07/07/240538 ALKPHOS 65 56 60 AST 21 16 11  ALT 10* 7* 7* BILITOT 0.4 0.2 0.1 ALBUMIN 2.8* 2.3* 2.2*  Recent Labs Lab 07/05/241521 07/06/240138 07/06/241412 07/07/240538 07/07/240638 LABPROT 11.8  --   --   --   --  INR 1.12  --   --   --   --  PTT  --    < > 49.4* >139.0* 48.1*  < > = values in this interval not displayed.  RECENT IMAGING: No results found.MICRO:Urine Culture, Routine >=100,000 CFU/mL Klebsiella pneumoniae Abnormal   Blood Culture Abnormal Stain Panic   Aerobic and Anaerobic Bottles Klebsiella pneumoniae Panic    All blood culture bottles with growth will be reported.    Blood Culture Gram Stain  Panic Anaerobic Bottle Gram negative rods  Aerobic Bottle Gram negative rods   CURRENT MEDICATIONS:  heparin 25,000 units/250 ml infusion - VTE TREATMENT (DVT/PE) 18 Units/kg/hr (06/28/23 0746) Current Facility-Administered Medications Medication Dose Route Frequency Provider Last Rate Last Admin  cefTRIAXone (ROCEPHIN) 1 g in sodium chloride 0.9% PF 10 mL (100 mg/mL)  1 g IV Push Q24H Nedra Hai, Soo-Hyun, MD   1 g at 06/27/23 1728  chlorhexidine gluconate (HIBICLENS/BETASEPT) 4 % topical liquid   Topical (Top) Daily Launa Grill, DO   Given at 06/28/23 1610  ethyl alcohol 62 % nasal swab 1 Swab  1 Swab Nasal Q12H [name], [name], DO   1 Swab at 06/28/23 0837  gabapentin (NEURONTIN) capsule 400 mg  400 mg Oral BID WC Nicki Reaper, PA   400 mg at 06/28/23 0837  gabapentin (NEURONTIN) tablet 600 mg  600 mg Oral Nightly Nicki Reaper, PA   600 mg at 06/27/23 2120  polyethylene glycol (MIRALAX) packet 17 g  17 g Oral Daily [name], Miguel Angel, DO   17 g at 06/28/23 0844  sodium chloride 0.9 % flush 3 mL  3 mL IV Push Q8H Nicki Reaper, PA   3 mL at 06/27/23 2120  acetaminophen  650 mg Oral Q6H PRN  heparin (porcine)  80 Units/kg Intravenous Q6H PRN  And  heparin (porcine)  40 Units/kg Intravenous Q6H PRN  ondansetron  4 mg Oral Q6H PRN  sodium chloride  3 mL IV Push PRN for Line Care  traMADoL  50 mg Oral Q4H PRN  Assessment & Plan: In summary, 67 y.o. female with PMHx of recurrent sepsis, DVT on apixaban, rheumatoid arthritis on hydroxychloroquine, anemia, perforated diverticulitis s/p R hemicolectomy/ileostomy, nonambulatory, who presented with fever and hypotension and was admitted to the ICU for management of septic shock secondary to bilateral ureteral stones.NEURO/ PSYCH:NAI- Mentation at baseline.  Patient is conserved, sister is Acupuncturist.#Analgesia- Continue tramadol 50 mg PO q4h PRN- Continue gabapentin 400 mg PO BID, 600 mg PO nightly PULMONOLOGY:NAI- CXR 06/26/23:  No evidence of pulmonary edema or focal consolidation. - Supplemental oxygen via nasal cannula PRN- Encourage incentive spirometry to prevent atelectasis  CARDIOVASCULAR: #Septic shock secondary to bilateral ureteral stonesPatient presented from long-term care facility with fever and hypotension, concern for sepsis.  Received 5 L in the ED on presentation without appropriate response, requiring ICU admission for pressor support.- MAP goal > 65 - No longer requiring Levophed as hemodynamically stable- Consider 250 mL boluses if hypotensive HEME/ONC:#Hematuria#DVT on apixaban#Chronic normocytic anemiaBaseline hemoglobin around 10.  On admission to the ICU,  hemoglobin 8.4 and has been uptrending.  No signs of active bleeding from ileostomy output, however Foley output continues to be blood-tinged likely due to anticoagulation.- On Heparin drip given recent history of DVT- Holding apixaban- Monitor CBC q12hGI: # S/p R hemicolectomy with ileostomyPatient with perforated diverticulitis of the ascending colon in 10/2021 requiring ex lap with right hemicolectomy and end ileostomy.- Colostomy care- Zofran 4 mg PO q6h PRN for nauseaBowel regimen: Miralax dailyRENAL: #Bilateral ureteral stones s/p stent placement#AKI, resolvedPatient was found to right 6 mm UVJ calculus with moderate obstruction on Sims A/P and underwent cystourethroscopy with Urology.  A left mid ureteral stone was also noted.  Bilateral stents were placed.  Patient with adequate urine output. - Continue to monitor I/O's. - Continue to monitor electrolytes, with repletion goals K>4, Mg >2.INFECTIOUS DISEASE: #Sepsis likely 2/2 to UTI c/n ureteral stone obstruction s/p stent#Septic shock#Gram Negative BacteremiaPatient with a history of ESBL infection  in March 2024.  Urine culture with > 100,000 CFU/mL Klebsiella pneumoniae, sensitive to ceftriaxone.- Continue Ceftriaxone 1g IV daily (7/5-)- Bcx growing Gram-negative rods, identified on PCR as Klebsiella pneumoniae- Trend fever curve ENDO/Rheum:# History of rheumatoid arthritisPatient has previously been on chronic steroids, most recently in April 2024.- Holding hydroxychloroquine 200 mg due to infectionICU CHECKLISTCode Status: Full CodeDrips:  HeparinDiet, Enteral Access: Diet Cardiac; PPX: DVT ppx: heparin, SCDsGI PPX:  Not indicatedElectrolytes: replenish PRN; goal K > 4, Mg > 2Lines: PIV (22g) Disposition: Transfer to floorsCare Representative [name], [place] (Conservator) Patient status and plan discussed with intensivist, Dr. Roselle Locus, MDInternal Medicine, PGY-1Available on MHB7/06/2023 6:57 AM

## 2023-06-28 NOTE — Plan of Care
Problem: InfectionGoal: Absence of Infection Signs and SymptomsOutcome: Interventions implemented as appropriate Problem: Adult Inpatient Plan of CareGoal: Plan of Care ReviewOutcome: Interventions implemented as appropriateGoal: Patient-Specific Goal (Individualized)Outcome: Interventions implemented as appropriateGoal: Absence of Hospital-Acquired Illness or InjuryOutcome: Interventions implemented as appropriateGoal: Optimal Comfort and WellbeingOutcome: Interventions implemented as appropriateGoal: Readiness for Transition of CareOutcome: Interventions implemented as appropriate Problem: Wound Healing ProgressionGoal: Optimal Wound HealingOutcome: Interventions implemented as appropriate Problem: Skin Injury Risk IncreasedGoal: Skin Health and IntegrityOutcome: Interventions implemented as appropriate Plan of Care Overview/ Patient Status    Patient alert and oriented at times seems confused or a little delayed in answering. Patient on room air. Patient heart rate within normal limits. Patient map above 65. Patient is a 1:1 feed, patient eats 50% of her meal. Patient urinating bloody to pink urine. Patient lifted out of bed.  Patient tolerated being in the chair. Patient transferred to [facility] to [facility].

## 2023-06-28 NOTE — Plan of Care
Plan of Care Overview/ Patient Status    Received patient in bed resting comfortably in no acute distress, oriented to person and place (knows she is in a hospital). Following commands and answering most questions appropriately. Contracted b/l lower extremities noted, painful to move. Moving b/l upper extremities with moderate strength and movement. VSS, Sinus 70s-80s. SBP 90s-110s, MAPs>65. Afebrile. Received on 1L NC with SpO2 98-99% and titrated to RA with SpO2 92-93%. Spirometry encouraged. Placed back on 1L NC overnight. Ileostomy in place and changed overnight. Moderate amount of stool noted. Foley present and continues to drain bloody urine but with good urine output overnight. MD aware of hematuria. Heparin gtt continued at 18 units/kg/hr and PTT therapeutic. Patient wheelchair bound at nursing home and lower extremities contracted. Assisted with T&P with pillow support for comfort overnight. SCDs on. Fall precaution in place, call light within reach and rounds continued for safety. 2200 BP decreased 80s/50s, MAPs<65. Bolus 250 ml administered with good effect. BP now stable and remains stable overnight.0630 PTT < 139 but blood drawn from same arm that heparin was infusing. Repeat PTT ordered and drawn on opposite arm. PTT 48.1 and remains therapeutic.

## 2023-06-28 NOTE — Other
[facility] REPORTPat Name:  Danielle [name], Danielle [name] No:  [name] D.O.B.:  02-Nov-1957Dict MD:  [name], M.D.Attend MD:  Donnalee Curry MD:   Adm/Ser DT:  07/05/2024Disch DT:   CSN:  [id number] Pat Locat:  [id number] DATE:  07/05/2024SURGEON:  [name], M.D.PREOPERATIVE DIAGNOSES:Bilateral renal calculi, bilateral ureteral obstruction, urosepsis.PROCEDURE:Bilateral ureteral stent placement.ANESTHESIA:General.INDICATIONS:This is a 67 year old female who was seen several weeks ago in the hospital for asymptomatic left mid ureteral stone.  At that time, the risks and benefits were discussed with the patient.  The patient wanted to observe, did not want any procedures done at that point.  The patient had an appointment to see me in a couple of days.  However, the patient came into the hospital with sepsis, fever, and chills.  A [place] scan report showed a right UVJ stone.  There was no mention of the left mid ureteral stone.  Upon review of the scan, the left mid ureteral stone was still present with no evidence of any hydro difficulty or any images consistent of obstruction on that side.  The patient was brought to the operating room urgently because of hypotension, sepsis despite the stone at the UVJ.  She was brought to the operating room, the following findings were evident.  There was a passed right ureteral stone in the bladder.  Purulent drainage coming from the edematous right orifice.  Left ureteropelvic evaluation suspect a radiolucent stone.DESCRIPTION OF PROCEDURE:The patient was brought to the operating room.  Time-out was done, placed in dorsal lithotomy position with Venodyne boots.  The patient was maximized as per ID medical people.  Bimanual examination showed significant constipation.  No pelvic masses.  Cystourethroscopy was performed with 30-degree lens 22-French cystoscope. Urine was sent for culture.  At this point, a bladder stone was seen in the bladder, it was grasped and removed.  There was purulent drainage coming from the right orifice, which minimal efflux suspect there was much edema.  A 0.3 Glidewire was advanced into the renal pelvis.  There was some tortuosity of the proximal ureter, but minimal hydro.  There was some purulent drainage in the right collecting system once the retrograde pyelogram was done.  In light of her sepsis and transient obstruction at the present moment, a 6 x 24 long-acting stent was placed without any difficulty.  Similarly, in terms of her left stone in light of her condition, we placed a stent on the left side.  Glidewire was easier advanced into the left system.  A 6 x 24 similar long-acting stent was placed.  Clear fluid was coming from the left kidney as opposed to the right kidney.  20 Coude catheter was placed.  Also she had a high-riding bladder neck, mild difficulty putting just a regular Foley catheter.  No other complications, sent to recovery in stable condition.Job #:  [id number] D:  06/27/2023 11:20 T:  06/27/2023 [id number] MEDQ  **Information in this report should not be considered final until authenticated by the author and should not be re-disclosed.**

## 2023-06-28 NOTE — Progress Notes
[facility]	MICU Progress NoteInterim History: This is hospital day 3 for this patient with the following issues: septic shock.  Pt did well ON, has remained off pressers.  Received 250cc crystalloid for MAP <65, responded well. WBC is improving, cx returned sensitive Klebsiella.Data: Drips:heparinVent Settings: Mode:   Tidal Volume:  Rate:   FiO2:   PEEP:   ABG: SaO2 98% on 1 lpm.ECG/Tele Events: Telemetry: NSRHemodynamics:CVP:   SpO2:   SVR: 	SVV:      StO2: I/O's:Gross Totals (Last 24 hours) at 06/28/2023 1248Last data filed at 06/28/2023 1200Intake 723.22 ml Output 3225 ml Net -2501.78 ml Vitals:O2 Sat and Device used: SpO2: 97 %Device (Oxygen Therapy): room air and Vitals Min & Max: BP  Min: 86/53  Max: 121/74Temp  Avg: 99.3 ?F (37.4 ?C)  Min: 97.9 ?F (36.6 ?C)  Max: 101.2 ?F (38.4 ?C)Pulse  Avg: 79.8  Min: 67  Max: 119Resp  Avg: 17.5  Min: 13  Max: 26SpO2  Avg: 96.3 %  Min: 91 %  Max: 99 %Weight  Avg: 65.6 kg  Min: 65.6 kg  Max: 65.6 kgNursing Data:I have reviewed all of the relevant nursing documentation.Medications: SCHEDULED: Current Facility-Administered Medications Medication Dose Route Frequency Provider Last Rate Last Admin  cefTRIAXone (ROCEPHIN) 1 g in sodium chloride 0.9% PF 10 mL (100 mg/mL)  1 g IV Push Q24H Nedra Hai, Soo-Hyun, MD   1 g at 06/27/23 1728  gabapentin (NEURONTIN) capsule 400 mg  400 mg Oral BID WC Nicki Reaper, PA   400 mg at 06/28/23 0837  gabapentin (NEURONTIN) tablet 600 mg  600 mg Oral Nightly Nicki Reaper, PA   600 mg at 06/27/23 2120  polyethylene glycol (MIRALAX) packet 17 g  17 g Oral Daily [name], Miguel Angel, DO   17 g at 06/28/23 0844  sodium chloride 0.9 % flush 3 mL  3 mL IV Push Q8H Gillern, Ashok Norris, PA   3 mL at 06/27/23 2120 INFUSIONS:  heparin 25,000 units/250 ml infusion - VTE TREATMENT (DVT/PE) 18 Units/kg/hr (06/28/23 0746) Exam: Breath sounds bilaterally, no crackles, no wheezesNormal S1S2Abdomen is soft, not tender, not distendedExtremities have no edemaStudies: Labs:Recent Labs   07/05/241517 07/05/241521 07/06/240639 07/07/240538 NA 144 139 143 143 K 3.3* 4.2 3.8 3.3* CL 110 112 115 115 CO2 23 22 21 22  BUN 16 16 14 13  CREATININE 1.34* 1.54* 1.23 1.02 GLU 80 116* 94 99 Recent Labs   07/05/242200 07/06/240138 07/06/240639 07/06/242004 07/07/240538 WBC 13.7* 14.0* 22.1*  --  12.8* HGB 10.2* 8.4* 9.2* 9.6* 9.5* - 9.5* HCT 33.50* 27.20* 30.30* 31.70* 30.90* - 30.90* PLT 188 150 163  --  156  Recent Labs   07/05/241521 07/06/240138 07/06/240808 07/06/241412 07/07/240538 07/07/240638 PTT  --    < > 47.1* 49.4* >139.0* 48.1* INR 1.12  --   --   --   --   --   < > = values in this interval not displayed. Diagnostics:No new radiology.Assessment/Plan: I have reviewed the patient's problem list and updated it as needed. Pulm:  Stable.  No indication fluid overload.  CXR in AM CV: Hemodynamically improved.  H/H stable.  Norepi remains off for MAP >65.  Hx DVT, DOAC held, continue heparin.  DVT dx 2/23 by [facility] abd, not provoked.  Per report no steroids >3 months (for RA), no stress dose steroid. GI: No active issues.  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  Has been  LTAC and SNF since.  Not ambulatory given this and advanced RA.  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 ID:  Antibiotics ceftriaxone.  BC positive Klebsiella, sensitive to ceftriaxone.  WBC improving.  Holding Plaquenil for now.   FEN: AKI, creat improving and adequate UOP.  S/P bilat ureter stent for obstructing stone L.  Hematuria improving this AM. Prophylaxis:  SCD, heparin. OOB.Dispo:  Okay to [place] out of ICU.Critical Care Attestation: This patient is critically ill as indicated by the following: Septic Shock with Severe Sepsis.The patient has required 55 minutes of my undivided attention during the past 24 hours for one of the previously noted life threatening diagnoses. This time is exclusive of the time devoted to procedures.Signed:Talik Casique Luna Fuse  Beeper: [id number]

## 2023-06-29 ENCOUNTER — Inpatient Hospital Stay: Admit: 2023-06-29 | Payer: MEDICARE

## 2023-06-29 ENCOUNTER — Encounter: Admit: 2023-06-29 | Payer: MEDICARE | Attending: Urology | Primary: Nephrology

## 2023-06-29 DIAGNOSIS — N200 Calculus of kidney: Secondary | ICD-10-CM

## 2023-06-29 LAB — COMPREHENSIVE METABOLIC PANEL
BKR A/G RATIO: 0.4
BKR ALANINE AMINOTRANSFERASE (ALT): 8 U/L — ABNORMAL LOW (ref 12–78)
BKR ALBUMIN: 2.1 g/dL — ABNORMAL LOW (ref 3.4–5.0)
BKR ALKALINE PHOSPHATASE: 67 U/L (ref 20–120)
BKR ANION GAP: 5 (ref 5–18)
BKR ASPARTATE AMINOTRANSFERASE (AST): 9 U/L (ref 5–37)
BKR AST/ALT RATIO: 1.1
BKR BILIRUBIN TOTAL: 0.2 mg/dL (ref 0.0–1.0)
BKR BLOOD UREA NITROGEN: 9 mg/dL (ref 8–25)
BKR BUN / CREAT RATIO: 12 (ref 8.0–25.0)
BKR CALCIUM: 9.5 mg/dL (ref 8.4–10.3)
BKR CHLORIDE: 116 mmol/L — ABNORMAL HIGH (ref 95–115)
BKR CO2: 23 mmol/L (ref 21–32)
BKR CREATININE: 0.75 mg/dL (ref 0.50–1.30)
BKR EGFR, CREATININE (CKD-EPI 2021): >60 mL/min/{1.73_m2} (ref >=60)
BKR GLOBULIN: 4.7 g/dL
BKR GLUCOSE: 88 mg/dL (ref 70–100)
BKR OSMOLALITY CALCULATION: 285 mosm/kg (ref 275–295)
BKR POTASSIUM: 3.2 mmol/L — ABNORMAL LOW (ref 3.5–5.1)
BKR PROTEIN TOTAL: 6.8 g/dL (ref 6.4–8.2)
BKR SODIUM: 144 mmol/L (ref 136–145)

## 2023-06-29 LAB — URINE CULTURE

## 2023-06-29 LAB — CBC WITHOUT DIFFERENTIAL
BKR WAM ANALYZER ANC: 5.79 x 1000/ÂµL (ref 2.00–7.60)
BKR WAM HEMATOCRIT (2 DEC): 28.8 % — ABNORMAL LOW (ref 35.00–45.00)
BKR WAM HEMOGLOBIN: 9.1 g/dL — ABNORMAL LOW (ref 11.7–15.5)
BKR WAM MCH (PG): 27.4 pg (ref 27.0–33.0)
BKR WAM MCHC: 31.6 g/dL (ref 31.0–36.0)
BKR WAM MCV: 86.7 fL (ref 80.0–100.0)
BKR WAM MPV: 10.8 fL (ref 8.0–12.0)
BKR WAM PLATELETS: 176 x1000/ÂµL (ref 150–420)
BKR WAM RDW-CV: 15.3 % — ABNORMAL HIGH (ref 11.0–15.0)
BKR WAM RED BLOOD CELL COUNT.: 3.32 M/ÂµL — ABNORMAL LOW (ref 4.00–6.00)
BKR WAM WHITE BLOOD CELL COUNT: 7.9 x1000/ÂµL (ref 4.0–11.0)

## 2023-06-29 LAB — BLOOD CULTURE   (BH GH L LMW YH): BKR BLOOD CULTURE: ABNORMAL — CR

## 2023-06-29 LAB — PARTIAL THROMBOPLASTIN TIME     (BH GH LMW Q YH): BKR PARTIAL THROMBOPLASTIN TIME: 42 s — ABNORMAL HIGH (ref 23.0–31.0)

## 2023-06-29 MED ORDER — POTASSIUM BICARBONATE-CITRIC ACID 20 MEQ EFFERVESCENT TABLET
20 | Freq: Once | ORAL | Status: CP
Start: 2023-06-29 — End: ?
  Administered 2023-06-29: 14:00:00 20 mEq via ORAL

## 2023-06-29 MED ORDER — APIXABAN 5 MG TABLET
5 | Freq: Two times a day (BID) | ORAL | Status: DC
Start: 2023-06-29 — End: 2023-07-01
  Administered 2023-06-30 – 2023-07-01 (×4): 5 mg via ORAL

## 2023-06-29 NOTE — Progress Notes
CC bilateral stent placement passage of right ureteral stone asymptomatic left proximal ureteral stoneHPICrystal Danielle [name] is Danielle 67 y.o. 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 (HC Code)   Anemia   Cellulitis and abscess of right lower extremity   Dysphagia   Encephalopathy   GERD (gastroesophageal reflux disease)   Hypercalcemia   Paraplegia (HC Code)   Rheumatoid arthritis (HC Code)  Past Surgical HistoryPast Surgical History: Procedure Laterality Date  BAND HEMORRHOIDECTOMY    CYSTECTOMY  1972  LAPAROSCOPIC SALPINGOOPHERECTOMY  1986  LAPAROTOMY  2022  PARTIAL COLECTOMY  2022  PEG TUBE PLACEMENT  01/2022  UPPER GASTROINTESTINAL ENDOSCOPY   AllergiesNo Known [email or web address] @ Social HistorySocial History Tobacco Use  Smoking status: Never  Smokeless tobacco: Never  Family History No family history on file.Review of SystemsNo symptoms beyond those noted in HPI and PMH- all other systems are negativePhysical ExamBP 103/66  - Pulse 78  - Temp 99.8 ?F (37.7 ?C) (Temporal)  - Resp 20  - Ht 5' 10 (1.778 m)  - Wt 65.6 kg  - SpO2 95%  - BMI 20.75 kg/m? Lab Results Component Value Date  WBC 7.9 06/29/2023  HGB 9.1 (L) 06/29/2023  HCT 28.80 (L) 06/29/2023  PLT 176 06/29/2023  ALT 8 (L) 06/29/2023  AST 9 06/29/2023  NA 144 06/29/2023  K 3.2 (L) 06/29/2023  CL 116 (H) 06/29/2023  CREATININE 0.75 06/29/2023  BUN 9 06/29/2023 CO2 23 06/29/2023  TSH 0.527 03/09/2023  INR 1.08 06/28/2023  GLU 88 06/29/2023  HGBA1C 5.5 03/09/2023   No results found for: UCOLOR, UGLUCOSE, UKETONE, USPECGRAVITY, UPH, UPROTEIN, UNITRATES, UBLOOD, ULEUKOCYTES, UA  No results found for: PVRPOCT Previous resultsNo results found for: TESTOSTERONE AUA Symptom Score today:      No data to display     PATHOLOGY AND BIOPSIESTEST RESULTSImagesDISCUSSIONFL OR Urography RetrogradeResult Date: 06/27/2023 Intraoperative fluoroscopy was utilized for bilateral retrograde injections and bilateral ureteral stent placements. Reported and signed by:  Danielle Clines, MD [place] Abdomen Pelvis wo IV Contrast (No Oral)  (Renal Stone)Result Date: 06/26/2023 1. Moderate obstruction of the right kidney from Danielle 6, right UVJ calculus. 2. Nonobstructing renal calculi. 3. Gallstones with trace pericholecystic fluid could be secondary to the adjacent inflammatory change. Cholecystitis cannot be entirely excluded. 4. Postoperative changes with extensive colonic diverticulosis. Reported and signed by:  Danielle Clines, MD XR Chest PA or APResult Date: 7/5/2024Limited AP view- No acute abnormality. [facility] Radiology Notify System Classification: Routine. Reported and signed by:  Danielle [name] US Duplex Lower Extremity Venous BilatResult Date: 06/19/2023 No DVT of the right or left lower extremity. [facility] Radiology Notify System Classification: Routine. Reported and signed by:  Danielle Chimera, MD [place] Abdomen Pelvis w IV Contrast (No Oral)Result Date: 05/24/2023  1. Extensive colonic diverticulosis without [place] evidence for diverticulitis. No definite contrast extravasation is seen to identify Danielle source of bleeding. 2. Nonobstructing right renal calculi. Delayed left nephrogram with Danielle new 5 mm calculus projecting at the level of the left iliac crest highly suspicious for Danielle left ureteral calculus Dr. Alena [name] discussed these findings with Danielle Constant, PA at 2:56 AM. I discussed these findings with Dr. Sherlie Ban  [name] at 9:30 AM. Radiology Notify System Classification: Clinical team aware. Individual dose optimization technique was used for the performance of this procedure, and one or more of the following dose reduction techniques was used: Automated exposure control and/or adjustment of the MA and/or kV according to patient size and/or the use of iterative reconstruction technique. Reported and signed by:  Danielle Baseman, MD CXR portableResult Date: 03/09/2023  No active cardiopulmonary abnormalities.  No change. [facility]: Routine. Reported and signed by: Danielle Critchley, MD   Assessment & Plan:Danielle [name] is Danielle 67 y.o. female patient off pressors blood pressure returning to normalDC FoleyContinue antibiotics per Medicine and ID consultWill have lithotripsy in several weeks KUB ordered@DXC @Orders  Placed This Encounter Procedures  Blood culture  SARS-CoV-2 (COVID-19)/Influenza Danielle+B/RSV by RT-PCR ([facility] LMW [facility])  Urine culture  Urine culture  Anaerobic Blood Culture Identification PCR (Lab Orderable Only)(Y[facility])  XR Chest PA or AP  [place] Abdomen Pelvis wo IV Contrast (No Oral)  (Renal Stone)  FL OR Urography Retrograde  XR Abdomen AP  Lactic acid, plasma (reflex 2h repeat)  Protime and INR  Urinalysis with culture reflex (obtain straight cath if patient unable to provide sample)  CBC auto differential  Comprehensive metabolic panel  UA reflex to culture  Urinalysis with culture reflex     ([facility] LMW [facility])  Urine microscopic     ([facility] LMW [facility])  Magnesium  CBC auto differential  Procalcitonin     ([facility] GH LMW Q Y[facility])  C-reactive protein (CRP)  Stone analysis  CBC No diff  Partial thromboplastin time  Partial thromboplastin time  Cortisol  CBC No differential  Partial thromboplastin time  Lactic acid, plasma (reflex 2h repeat)  Partial thromboplastin time  Comprehensive metabolic panel  C-reactive protein (CRP)  Hemoglobin and hematocrit, blood  Partial thromboplastin time     ([facility] LMW Q Y[facility])  Partial thromboplastin time  Comprehensive metabolic panel  Partial thromboplastin time  PT/INR and PTT  Partial thromboplastin time  Comprehensive metabolic panel  CBC No differential  Diet Cardiac  Sepsis Alert  Strain all urine  Discontinue PACU Orders  Remove Urinary Catheter  Full Code  PT Eval and Treat  Incentive Spirometry  CARDIAC EKG RESULT SCAN  CARDIAC MISC.  RESULT SCAN  CARDIAC MISC.  RESULT SCAN  CARDIAC MISC.  RESULT SCAN  CARDIAC MISC.  RESULT SCAN  CARDIAC MISC.  RESULT SCAN  EKG  Type and screen  ED Admit to Inpatient  Transfer patient  Transfer patient  Transfer patient  Transfer patient  Transfer patient  CBC and differential  Comprehensive metabolic panel  CBC and Differential  Comprehensive metabolic panel  Place in ED Observation No follow-ups on file.Patient will let us know if any new problems or symptoms arise in the interimNo name on file

## 2023-06-29 NOTE — Progress Notes
[facility]	Urology Progress NoteAttending Provider: August Saucer, MDPost-Operative Day: POD# 3 S/P cystoscopy, bilateral ureteral stent placementHospital LOS: 3 days Subjective Subjective: Patient seen and examined. She complains of mid low back pain. Says tramadol is helping. Admits pain is mostly chronic. Denies flank pain, abdominal or pelvic pain.  Objective Objective: Vitals:Last 24 hours: Temp:  [97.4 ?F (36.3 ?C)-100.2 ?F (37.9 ?C)] 98.6 ?F (37 ?C)Pulse:  [64-93] 70Resp:  [15-24] 20BP: (88-116)/(50-67) 116/67SpO2:  [92 %-99 %] 97 %Physical ExamGeneral: awake and alert, lying in hospital bed, in no acute distressAbdomen: soft, nontender, ostomy functioningGU: foley in place with hematuria notedMSK: moving all four extremities spontaneouslyNeuro: following commands appropriately Labs:Last 24 hours: Recent Results (from the past 24 hour(s)) PT/INR and PTT  Collection Time: 06/28/23  1:34 PM Result Value Ref Range  Prothrombin Time 11.4 9.4 - 12.0 seconds  INR 1.08 0.88 - 1.14  PTT 39.5 (H) 23.0 - 31.0 seconds Partial thromboplastin time  Collection Time: 06/29/23  5:43 AM Result Value Ref Range  PTT 42.0 (H) 23.0 - 31.0 seconds Comprehensive metabolic panel  Collection Time: 06/29/23  5:43 AM Result Value Ref Range  Sodium 144 136 - 145 mmol/L  Potassium 3.2 (L) 3.5 - 5.1 mmol/L  Chloride 116 (H) 95 - 115 mmol/L  CO2 23 21 - 32 mmol/L  Anion Gap 5 5 - 18  Glucose 88 70 - 100 mg/dL  BUN 9 8 - 25 mg/dL  Creatinine 0.86 5.78 - 1.30 mg/dL  Calcium 9.5 8.4 - 46.9 mg/dL  BUN/Creatinine Ratio 62.9 8.0 - 25.0  Total Protein 6.8 6.4 - 8.2 g/dL  Albumin 2.1 (L) 3.4 - 5.0 g/dL  Total Bilirubin 0.2 0.0 - 1.0 mg/dL  Alkaline Phosphatase 67 20 - 120 U/L  Alanine Aminotransferase (ALT) 8 (L) 12 - 78 U/L  Aspartate Aminotransferase (AST) 9 5 - 37 U/L  Globulin 4.7 g/dL  A/G Ratio 0.4   AST/ALT Ratio 1.1 Reference Range Not Established  Osmolality Calculation 285 275 - 295 mOsm/kg  eGFR (Creatinine) >60 >=60 mL/min/1.22m2 CBC No differential  Collection Time: 06/29/23  7:17 AM Result Value Ref Range  WBC 7.9 4.0 - 11.0 x1000/?L  RBC 3.32 (L) 4.00 - 6.00 M/?L  Hemoglobin 9.1 (L) 11.7 - 15.5 g/dL  Hematocrit 52.84 (L) 13.24 - 45.00 %  MCV 86.7 80.0 - 100.0 fL  MCH 27.4 27.0 - 33.0 pg  MCHC 31.6 31.0 - 36.0 g/dL  RDW-CV 40.1 (H) 02.7 - 15.0 %  Platelets 176 150 - 420 x1000/?L  MPV 10.8 8.0 - 12.0 fL  ANC(Abs Neutrophil Count) 5.79 2.00 - 7.60 x 1000/?L  Assessment Assessment: This is a 67 year old female admitted with urosepsis and R UVJ stone on [place], she is now POD 3 s/p cystoscopy and bilateral ureteral stent placement. There was no stone seen on the R intra-op but a L mid ureteral stone was noted.  Plan Plan: - Can remove foley per primary team and do a trial of void- Continue to document strict urine output even if foley is removed- Strain all urine- Continue abx per ID- Patient currently on heparin drip, has hematuria noted, per MD is okay to resume Eliquis from urology standpoint- Continue to trend H&H daily- Rest of care per primaryPatient and plan discussed with Dr. Hulen Luster Electronically Signed:Keyosha Tiedt Mila Palmer, PA7/07/2023 8:55 AM

## 2023-06-29 NOTE — Plan of Care
Problem: Physical Therapy GoalsGoal: Physical Therapy GoalsDescription: PT GOALS1. Patient will perform bed mobility with minimal assist2. Patient will perform transfers with minimal assist using appropriate assistive device Outcome: Interventions implemented as appropriate Plan of Care Overview/ Patient Status    Physical Therapy Note Patient Overview - 06/29/23 0950    Date of Visit / Treatment  Date of Visit / Treatment 06/29/23   Note Type Progress Note   Start Time 0950   End Time 1015    Patient Overview  History of Present Illness This is a 67 year old female admitted with urosepsis and R UVJ stone on [place], she is now POD 3 s/p cystoscopy and bilateral ureteral stent placement.   Precautions fall   Precautions - Additional Details/Comments +ostomy, pain in BLE, +foley   Subjective Pt states she is in a lot of pain.  Agreeable to PT.   General Observations Pt received in bed, in NAD, ostomy in place, foley intact, LLE flexed for comfort, pleasant, and cooperative.      Assessment - 06/29/23 1151    Assessment  Pain Rating 9   Pain Location - Additional Details/Comments BLE and lower back   Skin Integrity/Edema see skin documentation   Range of Motion within functional limits except (see comments)   Range of Motion - Additional Details/Comments LLE with LOM 2/2 to sig pain and stiffness   Muscle Strength/Tone within functional limits except (see comments)   Muscle Strength/Tone - Additional Details/Comments LLE 2-/5   Balance - Additional Details/Comments Poor + sitting balance      Functional Mobility - 06/29/23 1151    Functional Mobility  Supine to/from Sit Moderate assist;Verbal cues;Assist of 1   Sitting at EOB x 5 mins with BLE dangled.     AMPAC Basic Mobility - 06/29/23 1151    PT- AM-PAC - Basic Mobility Screen- How much help from another person do you currently need..... Turning from your back to your side while in a a flat bed without using rails? 2 - A Lot - Requires a lot of help (maximum to moderate assistance). Can use assistive devices.   Moving from lying on your back to sitting on the side of a flat bed without using bed rails? 2 - A Lot - Requires a lot of help (maximum to moderate assistance). Can use assistive devices.   Moving to and from a bed to a chair (including a wheelchair)? 1 - Total - Requires total assistance or cannot do it at all.   Standing up from a chair using your arms(e.g., wheelchair or bedside chair)? 1 - Total - Requires total assistance or cannot do it at all.   To walk in a hospital room? 1 - Total - Requires total assistance or cannot do it at all.   Climbing 3-5 steps with a railing? 1 - Total - Requires total assistance or cannot do it at all.   AMPAC Mobility Score 8   TARGET Highest Level of Mobility Mobility Level 3, Sit on edge of bed   ACTUAL Highest Level of Mobility Mobility Level 3, Sit on edge of bed      Recommendations for IP Admission - 06/29/23 1151    PT Recommendations for Inpatient Admission  Activity/Level of Assist out of bed;assist of 2;mechanical lift   Positioning reposition frequently   Therapeutic Exercise encourage exercise program issued      EDU - Learning Assessment - 06/29/23 1151    Education - Learning Assessment   Education Topic Functional  mobility techniques;Safety;Positioning;Uses of devices/equipment;Exercise program;Therapy role/rehab process;Discharge planning;Assistance/guarding   Learners Patient   Readiness Acceptance   Method Explanation   Response Verbalizes Understanding;Needs Reinforcement      Clinical Impression/Recommendation - 06/29/23 1151    Clinical Impression / Recommendation  Initial Assessment Pt tolerated follow up tx fair with VSS.   Mobility was limited by pain.  Pt continues to demonstrate pain in BLE and lower back PS 9/10, decreased ROM, strength, balance, safety, endurance, and mobility.  Pt would benefit from ongoing skilled PT services to address deficits prior to safe discharge.  Recommend 3x/wk PT to optimize overall mobility.   Patient Goal reduce pain;return to prior level of function   PT Frequency 3x per week   Physical Therapy Disposition Recommendation Moderate complexity support and therapy to progress functional mobility/ ADLs/ IADLs recommended for post- acute care.  See assessment for additional details.      PT Handoff - 06/29/23 1151    Handoff Documentation  Handoff Patient in bed;Bed alarm;Discussed with nursing;Patient instructed to call nursing for mobility     Patient resting comfortably in bed at end of session, bed alarm on, call bell within reach. RN aware. Nafisah Runions Seward Grater, PT, DPTBoard-Certified Clinical Specialist in Geriatric Physical Therapy Physical Medicine [facility]

## 2023-06-29 NOTE — Plan of Care
Plan of Care Overview/ Patient Status    Assumed care of patient at 43, dual RN handoff given at report. Alert & oriented. NSR, hr in 60s. On RA. Ileostomy in place. Output documented. Foley catheter in place, some blood tinged urine noted. On heparin gtt, handoff done with oncoming RN. All safety precautions maintained. POC ongoingProblem: InfectionGoal: Absence of Infection Signs and SymptomsOutcome: Interventions implemented as appropriate Problem: Adult Inpatient Plan of CareGoal: Plan of Care ReviewOutcome: Interventions implemented as appropriateGoal: Patient-Specific Goal (Individualized)Outcome: Interventions implemented as appropriateGoal: Absence of Hospital-Acquired Illness or InjuryOutcome: Interventions implemented as appropriateGoal: Optimal Comfort and WellbeingOutcome: Interventions implemented as appropriateGoal: Readiness for Transition of CareOutcome: Interventions implemented as appropriate Problem: Wound Healing ProgressionGoal: Optimal Wound HealingOutcome: Interventions implemented as appropriate Problem: Violence Risk or ActualGoal: Anger and Impulse ControlOutcome: Interventions implemented as appropriate Problem: Fall Injury RiskGoal: Absence of Fall and Fall-Related InjuryOutcome: Interventions implemented as appropriate Problem: Skin Injury Risk IncreasedGoal: Skin Health and IntegrityOutcome: Interventions implemented as appropriate Problem: Physical Therapy GoalsGoal: Physical Therapy GoalsDescription: PT GOALS1. Patient will perform bed mobility with minimal assist2. Patient will perform transfers with minimal assist using appropriate assistive device Outcome: Interventions implemented as appropriate

## 2023-06-29 NOTE — Plan of Care
Plan of Care Overview/ Patient Status    Assumed care of patient at 44, dual RN handoff given at report. Alert & oriented. NSR, RA.  Ileostomy in place. Foley catheter in place, some blood tinged urine noted. On heparin gtt, running at 18 units/kg/hr. Handoff done with day RN. All safety precautions maintained. POC ongoingProblem: InfectionGoal: Absence of Infection Signs and SymptomsOutcome: Interventions implemented as appropriate Problem: Adult Inpatient Plan of CareGoal: Plan of Care ReviewOutcome: Interventions implemented as appropriateGoal: Patient-Specific Goal (Individualized)Outcome: Interventions implemented as appropriateGoal: Absence of Hospital-Acquired Illness or InjuryOutcome: Interventions implemented as appropriateGoal: Optimal Comfort and WellbeingOutcome: Interventions implemented as appropriateGoal: Readiness for Transition of CareOutcome: Interventions implemented as appropriate Problem: Wound Healing ProgressionGoal: Optimal Wound HealingOutcome: Interventions implemented as appropriate Problem: Violence Risk or ActualGoal: Anger and Impulse ControlOutcome: Interventions implemented as appropriate Problem: Fall Injury RiskGoal: Absence of Fall and Fall-Related InjuryOutcome: Interventions implemented as appropriate Problem: Skin Injury Risk IncreasedGoal: Skin Health and IntegrityOutcome: Interventions implemented as appropriate Problem: Physical Therapy GoalsGoal: Physical Therapy GoalsDescription: PT GOALS1. Patient will perform bed mobility with minimal assist2. Patient will perform transfers with minimal assist using appropriate assistive device Outcome: Interventions implemented as appropriate

## 2023-06-29 NOTE — Plan of Care
Plan of Care Overview/ Patient Status    @0700 :Neuro: Pt A&Ox3. NAD noted. C/o 9/10 lower back pain. PRN Tramadol given.Resp: On RA satting appropriately.Act: Wheelchair bound at baseline. Assist x2 w/ care.Card: NSR on the monitor. BP-110's/70's. +2 pulses. G/I: Abdomen- soft/non-tender. RLQ ileostomy noted.  Output as documented.  Fair appetite this shift.G/U: Foley noted.  Output as documented. Skin: Healed abdominal incision noted. 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 Scheduled meds given as per MAR.  Daily Care provided. Safety  maintained. Care plan updated.VitalsTemp: 99.8 ?F (37.7 ?C)   Pulse: 78   Resp: 20   BP: 103/66   SpO2: 95 %Weight: 65.6 kg   Height: 5' 10 (177.8 cm)   BSA (Calculated - sq m): 1.8 sq meters   BMI (Calculated): 20.8 @1745 - Foley removed. No acute distress noted. Csare plan updated.@1750 -  Pt to transfer to Surgery unit.  Report given to [facility]. Belonging accounted for and transferred w/ pt. Care plan updated.Problem: InfectionGoal: Absence of Infection Signs and SymptomsOutcome: Interventions implemented as appropriate Problem: Adult Inpatient Plan of CareGoal: Plan of Care ReviewOutcome: Interventions implemented as appropriateGoal: Patient-Specific Goal (Individualized)Outcome: Interventions implemented as appropriateGoal: Absence of Hospital-Acquired Illness or InjuryOutcome: Interventions implemented as appropriateGoal: Optimal Comfort and WellbeingOutcome: Interventions implemented as appropriateGoal: Readiness for Transition of CareOutcome: Interventions implemented as appropriate Problem: Violence Risk or ActualGoal: Anger and Impulse ControlOutcome: Interventions implemented as appropriate Problem: Fall Injury RiskGoal: Absence of Fall and Fall-Related InjuryOutcome: Interventions implemented as appropriate Problem: Skin Injury Risk IncreasedGoal: Skin Health and IntegrityOutcome: Interventions implemented as appropriate

## 2023-06-29 NOTE — Progress Notes
[facility] Medicine Progress NoteAttending Provider: Harold Hedge, MD Subjective                                                                              Subjective: Interim History: Stable for transfer to [facility] today. Seen at bedside with sister present. Reports bilateral feet pain, not new. Tramadol provided with minimal effect as of now.Review of Allergies/Meds/Hx: Review of Allergies/Meds/Hx:I have reviewed the patient's: allergies, current scheduled medications, current infusions, current prn medications, past medical history, past surgical history, family history, social history, and prior to admission medications Objective Objective: Vitals:Last 24 hours: Temp:  [97.4 ?F (36.3 ?C)-100.2 ?F (37.9 ?C)] 98.6 ?F (37 ?C)Pulse:  [67-93] 69Resp:  [13-24] 18BP: (87-102)/(50-66) 101/63SpO2:  [91 %-99 %] 94 %I/O's:Gross Totals (Last 24 hours) at 06/29/2023 0340Last data filed at 06/28/2023 1930Intake 281.76 ml Output 2350 ml Net -2068.24 ml Procedures:None Physical ExamConstitutional:     General: She is not in acute distress.   Appearance: She is ill-appearing. Neurological:    Mental Status: She is alert. Constitutional:  Chronically ill appearing female, lying in bed, in no acute distressHEENT: Pupils equal and reactive to light bilaterally. Extraocular movements intact. No scleral icterus or conjunctival injection. CV:  RRR. Normal S1, S2;  No murmurs, rubs or gallops. Respiratory: Clear to auscultation bilaterally. No wheezes, rales or rhonchi. GI: Soft, non-distended, mild late tender in the suprapubic region to deep palpation.  Ileostomy in place in right lower quadrant with greenish output in bag.Extremities: No peripheral edema. DP pulses intactMSK: Upper and lower extremities contracted bilaterally.  Neurologic:  Alert and oriented, at patient baseline. No facial asymmetry or dysarthria present. CN 2-12 grossly normal as tested. Psychiatric: Normal mood and affectSkin: No rash or lesions Labs:Last 24 hours: Recent Results (from the past 24 hour(s)) CBC No diff  Collection Time: 06/28/23  5:38 AM Result Value Ref Range  WBC 12.8 (H) 4.0 - 11.0 x1000/?L  RBC 3.48 (L) 4.00 - 6.00 M/?L  Hemoglobin 9.5 (L) 11.7 - 15.5 g/dL  Hematocrit 16.10 (L) 96.04 - 45.00 %  MCV 88.8 80.0 - 100.0 fL  MCH 27.3 27.0 - 33.0 pg  MCHC 30.7 (L) 31.0 - 36.0 g/dL  RDW-CV 54.0 (H) 98.1 - 15.0 %  Platelets 156 150 - 420 x1000/?L  MPV 10.9 8.0 - 12.0 fL  ANC(Abs Neutrophil Count) 10.68 (H) 2.00 - 7.60 x 1000/?L Hemoglobin and hematocrit, blood  Collection Time: 06/28/23  5:38 AM Result Value Ref Range  Hemoglobin 9.5 (L) 11.7 - 15.5 g/dL  Hematocrit 19.14 (L) 78.29 - 45.00 % Partial thromboplastin time  Collection Time: 06/28/23  5:38 AM Result Value Ref Range  PTT >139.0 (HH) 23.0 - 31.0 seconds Comprehensive metabolic panel  Collection Time: 06/28/23  5:38 AM Result Value Ref Range  Sodium 143 136 - 145 mmol/L  Potassium 3.3 (L) 3.5 - 5.1 mmol/L  Chloride 115 95 - 115 mmol/L  CO2 22 21 - 32 mmol/L  Anion Gap 6 5 - 18  Glucose 99 70 - 100 mg/dL  BUN 13 8 - 25 mg/dL  Creatinine 5.62 1.30 - 1.30 mg/dL  Calcium 9.0 8.4 - 86.5 mg/dL  BUN/Creatinine Ratio 78.4 8.0 - 25.0  Total  Protein 6.7 6.4 - 8.2 g/dL  Albumin 2.2 (L) 3.4 - 5.0 g/dL  Total Bilirubin 0.1 0.0 - 1.0 mg/dL  Alkaline Phosphatase 60 20 - 120 U/L  Alanine Aminotransferase (ALT) 7 (L) 12 - 78 U/L  Aspartate Aminotransferase (AST) 11 5 - 37 U/L  Globulin 4.5 g/dL  A/G Ratio 0.5   AST/ALT Ratio 1.6 Reference Range Not Established  Osmolality Calculation 285 275 - 295 mOsm/kg  eGFR (Creatinine) 60 >=60 mL/min/1.75m2 Partial thromboplastin time  Collection Time: 06/28/23  6:38 AM Result Value Ref Range  PTT 48.1 (H) 23.0 - 31.0 seconds PT/INR and PTT  Collection Time: 06/28/23  1:34 PM Result Value Ref Range  Prothrombin Time 11.4 9.4 - 12.0 seconds  INR 1.08 0.88 - 1.14  PTT 39.5 (H) 23.0 - 31.0 seconds Diagnostics:No new radiology.ECG/Tele Events: I have reviewed the patient's ECG as resulted in the EMR. Assessment Assessment: Assessment:67 year old female with history of Multiple Sepsis Episodes, DVT (Eliquis), RA (Plaquenil), Anemia, Perforated Diverticulitis (s/p Right Hemicolectomy/Ileostomy), Bed Bound who was admitted to [facility] ICU due to septic shock from bilateral ureteral stones, now stable for transfer from ICU today to [facility]. Will be placed under [facility] service with continued house staff support. Seen at bedside in [facility] today, sister at bedside, reports pain of feet which is not new and just got PTA Tramadol.Principal Problem:  Kidney stone  SNOMED [place](R): KIDNEY STONE  Active Problems:  Septic shock (HC Code)  SNOMED [place](R): SEPTIC SHOCK    Sepsis due to urinary tract infection (HC Code) (HC CODE) (HC Code)  SNOMED [place](R): SEPSIS DUE TO URINARY TRACT INFECTION    Sepsis (HC Code)  SNOMED [place](R): SEPSIS    Plan Plan: # Septic Shock, UTI, BL Ureteral Stone Obstruction s/p Stenting - - Ceftriaxone- FU Cultures- Tylenol PRN# Hx of DVT - - Holding PTA Eliquis- Continue Heparin gtt# Hx of Chronic Pain, Non Ambulatory at Baseline -- Continue PTA Tramadol and Gabapentin# Hx of Perforated Diverticulitis s/p Right Hemicolectomy, ileostomy -- Ostomy Care- Zofran PRN# Hx of RA -- Holding PTA HydroxychloroquineElectronically Signed:Hara Milholland Katrinka Blazing, MD7/06/2023, 3:23 PM

## 2023-06-29 NOTE — Plan of Care
Plan of Care Overview/ Patient Status    1840  Received pt from Tele. Pt is A&Ox3 forgetful . Room air no distress noted. Abdomen soft ostomy in place, small stool noted in bag, stoma red moist. Due to void . Diaper in place . Belongings bedside in brown bag clothing and glasses 1900 Hover matt stretcher pt sent to xray Problem: Adult Inpatient Plan of CareGoal: Plan of Care ReviewOutcome: Interventions implemented as appropriateGoal: Patient-Specific Goal (Individualized)Outcome: Interventions implemented as appropriateGoal: Absence of Hospital-Acquired Illness or InjuryOutcome: Interventions implemented as appropriateGoal: Optimal Comfort and WellbeingOutcome: Interventions implemented as appropriateGoal: Readiness for Transition of CareOutcome: Interventions implemented as appropriate Problem: InfectionGoal: Absence of Infection Signs and SymptomsOutcome: Interventions implemented as appropriate Problem: Wound Healing ProgressionGoal: Optimal Wound HealingOutcome: Interventions implemented as appropriate

## 2023-06-30 LAB — CBC WITHOUT DIFFERENTIAL
BKR WAM ANALYZER ANC: 3.73 x 1000/ÂµL (ref 2.00–7.60)
BKR WAM HEMATOCRIT (2 DEC): 30.4 % — ABNORMAL LOW (ref 35.00–45.00)
BKR WAM HEMOGLOBIN: 9.2 g/dL — ABNORMAL LOW (ref 11.7–15.5)
BKR WAM MCH (PG): 26.9 pg — ABNORMAL LOW (ref 27.0–33.0)
BKR WAM MCHC: 30.3 g/dL — ABNORMAL LOW (ref 31.0–36.0)
BKR WAM MCV: 88.9 fL (ref 80.0–100.0)
BKR WAM MPV: 10.9 fL (ref 8.0–12.0)
BKR WAM PLATELETS: 209 x1000/ÂµL (ref 150–420)
BKR WAM RDW-CV: 15.5 % — ABNORMAL HIGH (ref 11.0–15.0)
BKR WAM RED BLOOD CELL COUNT.: 3.42 M/ÂµL — ABNORMAL LOW (ref 4.00–6.00)
BKR WAM WHITE BLOOD CELL COUNT: 5.9 x1000/ÂµL (ref 4.0–11.0)

## 2023-06-30 LAB — COMPREHENSIVE METABOLIC PANEL
BKR A/G RATIO: 0.4
BKR ALANINE AMINOTRANSFERASE (ALT): 8 U/L — ABNORMAL LOW (ref 12–78)
BKR ALBUMIN: 2.2 g/dL — ABNORMAL LOW (ref 3.4–5.0)
BKR ALKALINE PHOSPHATASE: 62 U/L — ABNORMAL HIGH (ref 20–120)
BKR ANION GAP: 3 % — ABNORMAL LOW (ref 5–18)
BKR ASPARTATE AMINOTRANSFERASE (AST): 11 U/L (ref 5–37)
BKR AST/ALT RATIO: 1.4
BKR BILIRUBIN TOTAL: 0.3 mg/dL — ABNORMAL HIGH (ref 0.0–1.0)
BKR BLOOD UREA NITROGEN: 9 mg/dL — ABNORMAL HIGH (ref 8–25)
BKR BUN / CREAT RATIO: 12.7 mg/dL — ABNORMAL HIGH (ref 8.0–25.0)
BKR CALCIUM: 10.1 mg/dL (ref 8.4–10.3)
BKR CHLORIDE: 116 mmol/L — ABNORMAL HIGH (ref 95–115)
BKR CO2: 26 mmol/L (ref 21–32)
BKR CREATININE: 0.71 mg/dL (ref 0.50–1.30)
BKR EGFR, CREATININE (CKD-EPI 2021): >60 mL/min/{1.73_m2} (ref >=60)
BKR GLOBULIN: 5 g/dL
BKR GLUCOSE: 93 mg/dL (ref 70–100)
BKR OSMOLALITY CALCULATION: 287 mOsm/kg (ref 275–295)
BKR POTASSIUM: 3.5 mmol/L (ref 3.5–5.1)
BKR PROTEIN TOTAL: 7.2 g/dL (ref 6.4–8.2)
BKR SODIUM: 145 mmol/L (ref 136–145)

## 2023-06-30 MED ORDER — ACETAMINOPHEN 325 MG TABLET
325 | Freq: Four times a day (QID) | ORAL | Status: DC | PRN
Start: 2023-06-30 — End: 2023-07-01

## 2023-06-30 MED ORDER — CEFUROXIME AXETIL 250 MG TABLET
250 | Freq: Two times a day (BID) | ORAL | Status: DC
Start: 2023-06-30 — End: 2023-07-01
  Administered 2023-06-30 – 2023-07-01 (×3): 250 mg via ORAL

## 2023-06-30 NOTE — Plan of Care
Plan of Care Overview/ Patient Status    [phone] = Review of record. Admitted to [facility] 06-26-23 and was in ICU and Intermediate Care secondary to Kidney Stone, Bilateral Ureteral Stent Placement and Urosepsis Transferred to Surg A on 06-29-23 She lives at [facility] and [place] agreed with plan to return there when medically stable. She is a LTC resident of the facility

## 2023-06-30 NOTE — Progress Notes
[facility]	Progress Note  3Attending Provider: August Saucer, MDSubjective: 24 hour events:NAOE Transferred from ICU yesterday for urosepsis Subjective:Reports feeling generalized weakness. But denies fever, chills, abdominal pain Continues to drain appropiratley from ostomy bag. Continues to have hematuria Meds: Scheduled Meds:Current Facility-Administered Medications Medication Dose Route Frequency Provider Last Rate Last Admin  apixaban (ELIQUIS) tablet 5 mg  5 mg Oral Q12H Serafina Topham, Soo-Hyun, MD      cefTRIAXone (ROCEPHIN) 1 g in sodium chloride 0.9% PF 10 mL (100 mg/mL)  1 g IV Push Q24H Nedra Hai, Soo-Hyun, MD   1 g at 06/28/23 1725  gabapentin (NEURONTIN) capsule 400 mg  400 mg Oral BID WC Gillern, Larisa, PA   400 mg at 06/29/23 0823  gabapentin (NEURONTIN) tablet 600 mg  600 mg Oral Nightly Gillern, Ashok Norris, PA   600 mg at 06/28/23 2107  polyethylene glycol (MIRALAX) packet 17 g  17 g Oral Daily [name], Miguel Angel, DO   17 g at 06/29/23 1610  sodium chloride 0.9 % flush 3 mL  3 mL IV Push Q8H Gillern, Larisa, PA   3 mL at 06/28/23 1314 Continuous Infusions:PRN Meds:acetaminophen, ondansetron, sodium chloride, traMADoLObjective: Vitals:Temp:  [98.1 ?F (36.7 ?C)-99.5 ?F (37.5 ?C)] 99.1 ?F (37.3 ?C)Pulse:  [64-83] 83Resp:  [18-20] 18BP: (95-116)/(55-70) 110/70SpO2:  [94 %-97 %] 96 %Device (Oxygen Therapy): room air  I/O's:Intake/Output Summary (Last 24 hours) at 06/29/2023 1602Last data filed at 06/29/2023 1534Gross per 24 hour Intake 150 ml Output 2600 ml Net -2450 ml  Physical Exam:Gen:  elderly, appears weak, in NADHEENT: PERRL, no LAD, anicteric, oropharynx benign, MMMCV: nl S1 and S2, RRR, no m/r/gPulm: CTAB, no wheezes, rales or ronchi. Breathing comfortably. GI: Soft, NT, ND, +BS. Ostomy bag on right side. No guarding or rebound tenderness. Ext: No peripheral edema, WWPNeuro: A&O x 3, EOM intact, face symmetric, intact sensation and strength throughout Skin: No rashesLabs: Recent Labs Lab 07/06/240639 07/06/242004 07/07/240538 07/08/240717 WBC 22.1*  --  12.8* 7.9 HGB 9.2*   < > 9.5* - 9.5* 9.1* HCT 30.30*   < > 30.90* - 30.90* 28.80* PLT 163  --  156 176  < > = values in this interval not displayed.  Recent Labs Lab 07/05/241521 07/05/242200 NEUTROPHILS 90.2* 92.5*  Recent Labs Lab 07/06/240639 07/07/240538 07/08/240543 NA 143 143 144 K 3.8 3.3* 3.2* CL 115 115 116* CO2 21 22 23  BUN 14 13 9  CREATININE 1.23 1.02 0.75 GLU 94 99 88 ANIONGAP 7 6 5   Recent Labs Lab 07/06/240639 07/07/240538 07/08/240543 CALCIUM 8.9 9.0 9.5  Recent Labs Lab 07/06/240639 07/07/240538 07/08/240543 ALT 7* 7* 8* AST 16 11 9  ALKPHOS 56 60 67 BILITOT 0.2 0.1 0.2  Recent Labs Lab 07/05/241521 07/06/240138 07/07/240638 07/07/241334 07/08/240543 PTT  --    < > 48.1* 39.5* 42.0* LABPROT 11.8  --   --  11.4  --  INR 1.12  --   --  1.08  --   < > = values in this interval not displayed.  Diagnostics/Radiology:[place] A/P 7/51. Moderate obstruction of the right kidney from a 6, right UVJ calculus.2. Nonobstructing renal calculi.3. Gallstones with trace pericholecystic fluid could be secondary to the adjacent inflammatory change. Cholecystitis cannot be entirely excluded.4. Postoperative changes with extensive colonic diverticulosis.Micro:Blood cx + klebsiella Ucx + klebsiella Assessment & Plan: Attending Provider: August Saucer, MD [phone]: urology 67 y.o. female with PMHx of DVT (eliquis), hx perforated diverticulitis s/p hemicolectomy/ileostomy, RA, hx recurrent UTI from kidney stones who is admitted for urosepsis 2/2 ureteral obstruction from  R kidney stone s/p bilateral stent placement on 7/5. Initially Admitted to ICU for pressor support, transferred to [facility] on 7/7 #septic shock - resolved #UTI 2/2 obstructing ureteral stone #hematuria Has hx of recurent obstructing kidney stone and uti in past. Found to have obstructing stone in R s/p ureteral stent bilaterally on 7/5. Ucx and blood cx + klebsiella - continue CTX IV (7/5-). Klebsiella susceptible to CTX - will likely transition to po abx tomorrow - trend fever curve - f/u urologist outpatient on 7/12 - remove foley today for trial of void #DVT - transition from heparin gtt to eliquis today - has chronic hematuria but given bed bound, recommended to be on AC. Should follow up w outpt hematologist for re-discussion #hx perf diverticulitis s/p hemicolectomy/ileostomy - continue colostomy care #RA - holding PTA plaquenil given active infection. - continue PTA gabapentin and tramadol - PT appreciated DIET: cardiac FLUIDS:po PPX: eliquis ACCESS: piv DISPO: floor CODE STATUS: full Electronically Signed:Soo-Hyun Nedra Hai, MD PGY-2Date: 7/8/2024Time: 4:02 PM

## 2023-06-30 NOTE — Plan of Care
[facility] NoteAssessment:  Religion:BaptistA & O. [employer]. Her religion is Baptist. She has a strong faith in God and in the power of prayer. She appreciates when others pray for her. She expressed no spiritual concerns. I explored her feelings about her illness and hospital stay. Her strong religious faith helps to sustain her during this trial. She welcomed a prayer. I prayed for her and offered a blessing. She expressed her gratitude for today's visit and continued prayers. During my visit I provided spiritual support, a non-anxious presence, and nonjudgmental supportive & empathetic active listening.Intervention:  Referral Source: Chaplain InitiatedResponding Chaplain: Unit ChaplainVisit and Intervention Type: Spiritual Visit Spiritual care interventions provided: Cultural, Religious or Spiritual Resources Interventions: : Prayer, Bible/Religious ReadingRelational or Interpersonal Interventions: : Active Listening and or Reflection, Introduction to [employer], Spiritual Support/Presence, Empathy Outcome: With the help of the chaplain, patient/loved one(s): Relational or Interpersonal Outcomes:: Established a Relationship with the Chaplain, Felt More at [employer], Felt Greater Comfort and/or Support Plan:  Patient is aware she can request additional chaplain visits for spiritual support if desired. I will continue to pray for her as requested. Follow-up as requested.06/30/2023 11:16 AM Plan of Care Overview/ Patient Status

## 2023-06-30 NOTE — Plan of Care
Problem: Physical Therapy GoalsGoal: Physical Therapy GoalsDescription: PT GOALS1. Patient will perform bed mobility with minimal assist2. Patient will perform transfers with minimal assist using appropriate assistive device Outcome: Interventions implemented as appropriate Plan of Care Overview/ Patient Status Physical Therapy Note Patient Overview - 06/30/23 1035    Date of Visit / Treatment  Date of Visit / Treatment 06/30/23   Note Type Progress Note   Start Time 1035   End Time 1100    Patient Overview  History of Present Illness This is a 67 year old female admitted with urosepsis and R UVJ stone on [place], she is now POD4 s/p cystoscopy and bilateral ureteral stent placement.   Precautions fall   Precautions - Additional Details/Comments +ostomy   Subjective Pt states she feels okay.  Agreeable to PT.   General Observations Pt received in bed, in NAD, ostomy in place, LLE flexed for comfort, pleasant, and cooperative.      Assessment - 06/30/23 1215    Assessment  Vital Signs vital signs stable   BP 96/60 mmhg HR 66 O2 97%.  BP 100/63 mmhg HR 64 bpm.  Pain Rating 0   Pain Location - Additional Details/Comments denied any pain   Skin Integrity/Edema see skin documentation   Range of Motion within functional limits except (see comments)   Range of Motion - Additional Details/Comments LLE with LOM 2/2 to sig pain and stiffness   Muscle Strength/Tone - Additional Details/Comments RLE 3/5, LLE 3-/5   Balance - Additional Details/Comments Poor standing balance      Functional Mobility - 06/30/23 1215    Functional Mobility  Supine to/from Sit Moderate assist;Verbal cues;Assist of 1   Sit to/from Stand Maximum assist;Verbal cues;Assist of 2   Sit to/from Stand Device Stedy   Bed to/from Chair Maximum assist;Verbal cues;Assist of 2   Bed to/from Chair Device Welcome      AMPAC Basic Mobility - 06/30/23 1215    PT- AM-PAC - Basic Mobility Screen- How much help from another person do you currently need.....  Turning from your back to your side while in a a flat bed without using rails? 2 - A Lot - Requires a lot of help (maximum to moderate assistance). Can use assistive devices.   Moving from lying on your back to sitting on the side of a flat bed without using bed rails? 2 - A Lot - Requires a lot of help (maximum to moderate assistance). Can use assistive devices.   Moving to and from a bed to a chair (including a wheelchair)? 1 - Total - Requires total assistance or cannot do it at all.   Standing up from a chair using your arms(e.g., wheelchair or bedside chair)? 2 - A Lot - Requires a lot of help (maximum to moderate assistance). Can use assistive devices.   To walk in a hospital room? 1 - Total - Requires total assistance or cannot do it at all.   Climbing 3-5 steps with a railing? 1 - Total - Requires total assistance or cannot do it at all.   AMPAC Mobility Score 9   TARGET Highest Level of Mobility Mobility Level 3, Sit on edge of bed   ACTUAL Highest Level of Mobility Mobility Level 4, Transfer to chair      Recommendations for IP Admission - 06/30/23 1215    PT Recommendations for Inpatient Admission  Activity/Level of Assist out of bed;assist of 2;stedy   ADL Recommendations bedside commode;assist of 2;stedy   Positioning reposition  frequently   Therapeutic Exercise encourage exercise program issued      EDU - Learning Assessment - 06/30/23 1215    Education - Learning Assessment   Education Topic Functional mobility techniques;Safety;Uses of devices/equipment;Exercise program;Therapy role/rehab process;Discharge planning;Assistance/guarding   Learners Patient   Readiness Acceptance   Method Explanation   Response Verbalizes Understanding      Clinical Impression/Recommendation - 06/30/23 1215 Clinical Impression / Recommendation  Initial Assessment Pt tolerated follow up tx fair with VSS. Pt making progress in mobility as evidenced by ability to participate in transfer using STEDY with extensive assist of 2 with no c/o pain.  Pt continues to benefit from ongoing skilled PT services to address deficits prior to safe discharge. Recommend 3x/wk PT to optimize overall mobility.   Patient Goal reduce pain;return to prior level of function   PT Frequency 3x per week   Physical Therapy Disposition Recommendation Moderate complexity support and therapy to progress functional mobility/ ADLs/ IADLs recommended for post- acute care.  See assessment for additional details.      PT Handoff - 06/30/23 1215    Handoff Documentation  Handoff Patient in chair;Chair alarm;Discussed with nursing;Patient instructed to call nursing for mobility     Patient sitting in chair at end of session, call bell within reach, chair alarm on. RN aware. Lasalle Abee Seward Grater, PT, DPTBoard-Certified Clinical Specialist in Geriatric Physical Therapy Physical Medicine [facility]

## 2023-06-30 NOTE — Progress Notes
[facility]	Progress Note  4Attending Provider: Lucrezia Starch, MDSubjective: 24 hour events:NAOE Subjective:Reports feeling generally much better. Sitting in chair eating breakfast. Denies fever, chills, abdominal pain Continues to drain appropiratley from ostomy bag. Continues to have hematuria Meds: Scheduled Meds:Current Facility-Administered Medications Medication Dose Route Frequency Provider Last Rate Last Admin  apixaban (ELIQUIS) tablet 5 mg  5 mg Oral Q12H Johney Perotti, Soo-Hyun, MD   5 mg at 06/30/23 0833  cefuroxime (CEFTIN) tablet 500 mg  500 mg Oral Q12H Christabelle Hanzlik, Soo-Hyun, MD   500 mg at 06/30/23 1135  gabapentin (NEURONTIN) capsule 400 mg  400 mg Oral BID WC Nicki Reaper, PA   400 mg at 06/30/23 0833  gabapentin (NEURONTIN) tablet 600 mg  600 mg Oral Nightly Nicki Reaper, PA   600 mg at 06/29/23 2036  polyethylene glycol (MIRALAX) packet 17 g  17 g Oral Daily [name], Miguel Angel, DO   17 g at 06/30/23 0834  sodium chloride 0.9 % flush 3 mL  3 mL IV Push Q8H Gillern, Larisa, PA   3 mL at 06/30/23 0834 Continuous Infusions:PRN Meds:acetaminophen, ondansetron, sodium chloride, traMADoLObjective: Vitals:Temp:  [97.6 ?F (36.4 ?C)-99.8 ?F (37.7 ?C)] 97.7 ?F (36.5 ?C)Pulse:  [62-83] 62Resp:  [16-20] 16BP: (99-114)/(61-74) 102/69SpO2:  [94 %-99 %] 98 %Device (Oxygen Therapy): room air  I/O's:Intake/Output Summary (Last 24 hours) at 06/30/2023 1335Last data filed at 06/30/2023 0500Gross per 24 hour Intake 100 ml Output 1125 ml Net -1025 ml  Physical Exam:Gen:  elderly, appears weak, in NADHEENT: PERRL, no LAD, anicteric, oropharynx benign, MMMCV: nl S1 and S2, RRR, no m/r/gPulm: CTAB, no wheezes, rales or ronchi. Breathing comfortably. GI: Soft, NT, ND, +BS. Ostomy bag on right side. No guarding or rebound tenderness. Ext: No peripheral edema, WWPNeuro: A&O x 3, EOM intact, face symmetric, intact sensation and strength throughout Skin: No rashesLabs: Recent Labs Lab 07/07/240538 07/08/240717 07/09/240549 WBC 12.8* 7.9 5.9 HGB 9.5* - 9.5* 9.1* 9.2* HCT 30.90* - 30.90* 28.80* 30.40* PLT 156 176 209  Recent Labs Lab 07/05/241521 07/05/242200 NEUTROPHILS 90.2* 92.5*  Recent Labs Lab 07/07/240538 07/08/240543 07/09/240549 NA 143 144 145 K 3.3* 3.2* 3.5 CL 115 116* 116* CO2 22 23 26  BUN 13 9 9  CREATININE 1.02 0.75 0.71 GLU 99 88 93 ANIONGAP 6 5 3*  Recent Labs Lab 07/07/240538 07/08/240543 07/09/240549 CALCIUM 9.0 9.5 10.1  Recent Labs Lab 07/07/240538 07/08/240543 07/09/240549 ALT 7* 8* 8* AST 11 9 11  ALKPHOS 60 67 62 BILITOT 0.1 0.2 0.3  Recent Labs Lab 07/05/241521 07/06/240138 07/07/240638 07/07/241334 07/08/240543 PTT  --    < > 48.1* 39.5* 42.0* LABPROT 11.8  --   --  11.4  --  INR 1.12  --   --  1.08  --   < > = values in this interval not displayed.  Diagnostics/Radiology:[place] A/P 7/51. Moderate obstruction of the right kidney from a 6, right UVJ calculus.2. Nonobstructing renal calculi.3. Gallstones with trace pericholecystic fluid could be secondary to the adjacent inflammatory change. Cholecystitis cannot be entirely excluded.4. Postoperative changes with extensive colonic diverticulosis.Micro:Blood cx + klebsiella Ucx + klebsiella Assessment & Plan: Attending Provider: Lucrezia Starch, MD [phone]: urology 67 y.o. female with PMHx of DVT (eliquis), hx perforated diverticulitis s/p hemicolectomy/ileostomy, RA, hx recurrent UTI from kidney stones who is admitted for urosepsis 2/2 ureteral obstruction from R kidney stone s/p bilateral stent placement on 7/5. Initially Admitted to ICU for pressor support, transferred to [facility] on 7/7 #septic shock - resolved #UTI 2/2 obstructing ureteral stone #  hematuria Has hx of recurent obstructing kidney stone and uti in past. Found to have obstructing stone in R s/p ureteral stent bilaterally on 7/5. Ucx and blood cx + klebsiella - will transition from CTX to ceftin today for 10d course of abx. D5  - trend fever curve - f/u urologist outpatient on 7/12 #DVT - continue on eliquis - has chronic hematuria but given bed bound, recommended to be on AC. Should follow up w outpt hematologist for re-discussion #hx perf diverticulitis s/p hemicolectomy/ileostomy - continue colostomy care #RA - holding PTA plaquenil given active infection. Likely resume at time of discharge - continue PTA gabapentin and tramadol - PT appreciated DIET: cardiac FLUIDS:po PPX: eliquis ACCESS: piv DISPO: floor CODE STATUS: full Electronically Signed:Soo-Hyun Nedra Hai, MD PGY-2Date: 7/9/2024Time: 1:35 PM

## 2023-06-30 NOTE — H&P
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  The patient underwent ureteral stent placement by Urology.  The patient was covered with IV antibiotics with ceftriaxone and tobramycin.  Antibiotic coverage was transitioned to meropenem restart in a.m., approved by Infectious Disease.  In addition the patient was recently diagnosed with DVT in June was placed on Eliquis.  While in the ED patient received multiple fluid boluses adding up to 4 L. While on the floor the patient was found to be continuous any hypotensive, and additional 1L bolus of LR was given no appropriate response.  The patient was transferred to the intensive care unit for septic shock requiring pressor support..Past Medical History: Diagnosis Date  Acute embolism and thrombosis of unspecified femoral vein (HC Code)   Anemia   Cellulitis and abscess of right lower extremity   Dysphagia   Encephalopathy   GERD (gastroesophageal reflux disease)   Hypercalcemia   Paraplegia (HC Code)   Rheumatoid arthritis (HC Code)   Histories: Past Medical History: Diagnosis Date  Acute embolism and thrombosis of unspecified femoral vein (HC Code)   Anemia   Cellulitis and abscess of right lower extremity   Dysphagia   Encephalopathy GERD (gastroesophageal reflux disease)   Hypercalcemia   Paraplegia (HC Code)   Rheumatoid arthritis (HC Code)  Medication allergies:- No known allergies.Surgical:Past Surgical History: Procedure Laterality Date  BAND HEMORRHOIDECTOMY    CYSTECTOMY  1972  LAPAROSCOPIC SALPINGOOPHERECTOMY  1986  LAPAROTOMY  2022  PARTIAL COLECTOMY  2022  PEG TUBE PLACEMENT  01/2022  UPPER GASTROINTESTINAL ENDOSCOPY   Family:No family history on file. Social:Social History Tobacco Use  Smoking status: Never  Smokeless tobacco: Never  Medications ordered for this admission:  heparin 25,000 units/250 ml infusion - VTE TREATMENT (DVT/PE) 18 Units/kg/hr (06/27/23 0630)  norepinephrine 0.06 mcg/kg/min (06/27/23 0630) Current Facility-Administered Medications Medication Dose Route Frequency Provider Last Rate Last Admin  chlorhexidine gluconate (HIBICLENS/BETASEPT) 4 % topical liquid   Topical (Top) Daily Adriano Bischof, [name], DO   Given at 06/27/23 9528  ertapenem Pincus Sanes) 1,000 mg in sodium chloride 0.9% 50 mL (mini-bag plus)  1,000 mg Intravenous Q24H Nicki Reaper, PA      ethyl alcohol 62 % nasal swab 1 Swab  1 Swab Nasal Q12H Mackinsey Pelland Lawanna Kobus, DO   1 Swab at 06/27/23 0847  gabapentin (NEURONTIN) capsule 400 mg  400 mg Oral BID WC Nicki Reaper, PA   400 mg at 06/27/23 0847  gabapentin (NEURONTIN) tablet 600 mg  600 mg Oral Nightly Gillern, Larisa, PA      polyethylene glycol (MIRALAX) packet 17 g  17 g Oral Daily Remmi Armenteros Angel, DO   17 g at 06/27/23 0846  sodium chloride 0.9 % flush 3 mL  3 mL IV Push Q8H Gillern, Ashok Norris, PA   3 mL at 06/26/23 2118  acetaminophen  650 mg Oral Q6H PRN  heparin (porcine)  80 Units/kg Intravenous Q6H PRN  And  heparin (porcine)  40 Units/kg Intravenous Q6H PRN  ondansetron  4 mg  Oral Q6H PRN  sodium chloride  3 mL IV Push PRN for Line Care Objective: Vitals:Temp: [97.9 ?F (36.6 ?C)-101 ?F (38.3 ?C)] 97.9 ?F (36.6 ?C)Pulse:  [72-100] 74Resp:  [14-20] 16BP: (71-133)/(40-100) 108/65SpO2:  [92 %-100 %] 98 %Device (Oxygen Therapy): nasal cannulaO2 Flow (L/min):  [1-6] 1 L via NCVentilator Data: N/ALabs: ABGs:No results for input(s): PHART, PCO2ART, PO2ART, O2SAT, O2SATART, HCO3ART in the last 72 hours.Recent Labs Lab 07/05/241517 07/05/241521 07/05/242200 07/06/240138 07/06/240639 WBC 22.2* 19.6* 13.7* 14.0* 22.1* HGB 11.5* 11.3* 10.2* 8.4* 9.2* HCT 36.80 35.60 33.50* 27.20* 30.30* PLT 228 238 188 150 163  Recent Labs Lab 07/05/241521 07/05/242200 NEUTROPHILS 90.2* 92.5*  Recent Labs Lab 07/05/241517 07/05/241521 07/06/240639 NA 144 139 143 K 3.3* 4.2 3.8 CL 110 112 115 CO2 23 22 21  BUN 16 16 14  CREATININE 1.34* 1.54* 1.23 GLU 80 116* 94 ANIONGAP 11 5 7   Recent Labs Lab 07/05/241517 07/05/241521 07/06/240639 CALCIUM 10.5* 10.1 8.9  Recent Labs Lab 07/05/241521 07/06/240639 ALT 10* 7* AST 21 16 ALKPHOS 65 56 BILITOT 0.4 0.2 PROT 8.0 6.6 ALBUMIN 2.8* 2.3*  Recent Labs Lab 07/05/241521 07/06/240138 07/06/240808 PTT  --  34.9* 47.1* LABPROT 11.8  --   --  INR 1.12  --   --   No results for input(s): TROPONINI in the last 72 hours. Recent Labs   07/05/241521 PROCALCITON 28.11*  Microbiology: - SARS-CoV-2 PCR negative_ Blood culture  Collection Time: 06/26/23  3:21 PM  Specimen: Peripheral; Blood Result Value  Blood Culture Abnormal Stain (AA)  Blood Culture Gram Stain Anaerobic Bottle Gram negative rods (AA) _ Blood culture #1  Collection Time: 03/12/23 10:55 AM  Specimen: Peripheral; Blood Result Value  Blood Culture No Growth after 5 days of incubation Blood culture  Collection Time: 03/09/23 12:36 PM  Specimen: Vein, Peripheral; Blood Result Value  Blood Culture Abnormal Stain (AA)  Blood Culture Escherichia coli (AA)  Blood Culture Gram Stain Aerobic and Anaerobic Bottle Gram negative rods (AA)     Susceptibility  Escherichia coli - MIC SUSCEPTIBILITY   ESBL  +    Ampicillin  Resistant ug/mL   Ampicillin + Sulbactam  Resistant ug/mL   Piperacillin + Tazobactam  Susceptible ug/mL   Ceftazidime  Resistant    Ceftriaxone  Resistant ug/mL   Cefepime  Resistant    Ertapenem  Susceptible ug/mL   Meropenem  Susceptible ug/mL   Amikacin  Intermediate ug/mL   Tobramycin  Resistant ug/mL   Ciprofloxacin  Resistant ug/mL   Trimethoprim + Sulfamethoxazole  Resistant ug/mL Blood culture  Collection Time: 03/09/23 12:36 PM  Specimen: Vein, Peripheral; Blood Result Value  Blood Culture No Growth after 5 days of incubation _ Blood culture #1  Collection Time: 04/05/22  5:49 PM  Specimen: Peripheral; Blood Result Value  Blood Culture No Growth after 5 days of incubation Imaging/EKG: FL OR Urography RetrogradeResult Date: 06/27/2023 Intraoperative fluoroscopy was utilized for bilateral retrograde injections and bilateral ureteral stent placements. Reported and signed by:  Carolan Clines, MD [place] Abdomen Pelvis wo IV Contrast (No Oral)  (Renal Stone)Result Date: 06/26/2023 1. Moderate obstruction of the right kidney from a 6, right UVJ calculus. 2. Nonobstructing renal calculi. 3. Gallstones with trace pericholecystic fluid could be secondary to the adjacent inflammatory change. Cholecystitis cannot be entirely excluded. 4. Postoperative changes with extensive colonic diverticulosis. Reported and signed by:  Carolan Clines, MD XR Chest PA or APResult Date: 7/5/2024Limited AP view- No acute abnormality. [facility] Radiology Notify System Classification: Routine. Reported and signed by:  Mirian Mo  Results for orders placed or performed during the hospital encounter of 03/09/23 Echo 2D Complete w Doppler and CFI if Ind Image Enhancement 3D and or bubbles Result Value Ref Range  Reported Biplane EF% 69 %  Narrative  ~ * Normal left ventricular size, wall thickness, systolic function and wall motion. LVEF calculated by biplane Simpson's was 69%.  Normal diastolic function and filling pressures.* Suboptimal visualization of the right ventricle, but it appears to be normal size with normal function.  Tricuspid regurgitation envelope is inadequate for estimation of right ventricular systolic pressure.* Atria are normal in size.  No interatrial shunt by color Doppler.* No significant valvular abnormalities.* Trivial pericardial effusion.* No prior study available for comparison.  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 The patient was admitted to ICU for septic shock requiring pressor support. Plan: Intensivist: Gwenlyn Found, MDConsults: NoneNEUROLOGY: / PSYCH:- NAI- Continue Gabapentin for Neuropathy - Holding TramadolPULMONOLOGY:#Hypoxia/Apnea?- Requiring 1L via NC- Satting 97% on 1L 93% on RA- nasal oxygen prn- Incentive spirometryCARDIOVASCULAR: #Septic Shock- MAP goal 65- On levophed - Consider Small boluses HEMATOLOGY:#Hematuria#DVT#Anemia- DVT diagnosed at beginning of month, on eliquis- On Heparin drip- Hold Eliquis for now- No Frank blood on drain- No other signs of bleeding - Monitor CBC in AM- Last Hgb 8.4 from 10.4 likely dilutional from receiving 4+L of iv fluids GASTROENTEROLOGY: #Ileostomy bag in place- colostomy care- antiemetics prnNEPHROLOGY: #Ureteral Stone s/p stent placement#AKI-Had ureteral stent placed by Urology-Check electrolytes and replete accordingly -Monitor I/P-Trend Serum Creatinine- Avoid nephrotoxic agentsINFECTIOUS DISEASE: #Sepsis likely 2/2 to UTI c/n ureteral stone obstruction s/p stent#Septic Shock#Gram Negative Bacteremia- ESBL positive E coli in blood and urine in March- s/p Ceftriaxone and Tobramycin - Switched to Ertapenem to start tomorrow 06/06- follow urine and blood cultures- Trend fever curve- Pressor support for septic ShockENDOCRINOLOGY/Rheumatology:#Rheumatoid Arthritis- Continue PTA Plaquenil GOALS OF CARE:Care Representative [name], [place] (Conservator)Diet, Enteral Access: Diet Cardiac; Disposition: ICUElectrolytes: replenish PRN; goal K > 4, Mg > 2Devices, Lines: PIVProphylaxis: HeparinCode Status: Full CodeExcellent nursing care appreciated. Patient status and plan discussed with intensivist, Dr. Kathi Ludwig (Tele ICU)Electronically Signed:Lorin Gawron DOResident Physician, PGY-3Internal MedicinePager: [facility]: [phone] 6, 20245:21 AM

## 2023-06-30 NOTE — Plan of Care
Plan of Care Overview/ Patient Status    1445 = Cle[facility] for [facility] to [facility] on 07-01-23 Will plan DC at 1030Called conservator [name] -and reviewed plan, in agreement Discussed the following = I explained that the cost of transportation by wheelchair is not covered by insurance. The cost is based on a base rate plus mileage. This is a private pay situation and will be billed to the patient Verifying with nursing and Phys T that [facility] is appropriate vs stretcherReviewed Medicare IM - no intent to appealTexted BA and requested transport be set up Call placed to [place] at [facility] - asked that he return my call to verify return on 07-01-23 at 1030  Verified that they can accept her back tomorrow AM at 1030

## 2023-06-30 NOTE — Plan of Care
Plan of Care Overview/ Patient Status    1900 Dual RN bedside handoff completed.  Patient resting comfortably in bed. A&Ox3 forgetful at times. Vitals signs stable, afebrile. On room air breathing unlabored, lung CTAB, denies SOB, denies chest pain. Abd soft, non tender, non distended. +BSx4, ostomy to RLQ in place. Tolerating cardiac diet. Total feed. No c/o N/V. Diaper in place. Wheelchair bound baseline. Heplocked PIV, patent, CDI. Mepalex to Left abdomen in place, CDI. Ice packs in place. B/L SCD's in place. CSM intact, (+)PP, radial pulse. (+)Dorsi/plantar flexion. Denies numbness/tingling.  Safety measures maintained. Hourly rounding. Bed alarm on. Call bell/personal items within reach. Patient/family acknowledges understanding of fall prevention protocol. Will continue to monitor.0600 Ostomy output 50cc dark brown Problem: InfectionGoal: Absence of Infection Signs and SymptomsOutcome: Interventions implemented as appropriate Problem: Adult Inpatient Plan of CareGoal: Plan of Care ReviewOutcome: Interventions implemented as appropriateGoal: Patient-Specific Goal (Individualized)Outcome: Interventions implemented as appropriateGoal: Absence of Hospital-Acquired Illness or InjuryOutcome: Interventions implemented as appropriateGoal: Optimal Comfort and WellbeingOutcome: Interventions implemented as appropriateGoal: Readiness for Transition of CareOutcome: Interventions implemented as appropriate Problem: Wound Healing ProgressionGoal: Optimal Wound HealingOutcome: Interventions implemented as appropriate Problem: Violence Risk or ActualGoal: Anger and Impulse ControlOutcome: Interventions implemented as appropriate Problem: Fall Injury RiskGoal: Absence of Fall and Fall-Related InjuryOutcome: Interventions implemented as appropriate Problem: Skin Injury Risk IncreasedGoal: Skin Health and IntegrityOutcome: Interventions implemented as appropriate Problem: Physical Therapy GoalsGoal: Physical Therapy GoalsDescription: PT GOALS1. Patient will perform bed mobility with minimal assist2. Patient will perform transfers with minimal assist using appropriate assistive device Outcome: Interventions implemented as appropriate

## 2023-06-30 NOTE — Plan of Care
Plan of Care Overview/ Patient Status    0700: Dual RN bedside handoff completed0800: Received pt awake in bed. A+O x2, alert to self and time. RA. Lung sounds diminished, no accessory muscle use noted. No c/o of CP or SOB. +BS. Abd soft, non-tender. No n/v/d. Tolerating cardiac diet. Voiding light pink urine to purewick. Ileostomy to RLQ, liquid brown output noted. Skin is warm, dry and intact, w/ +PP/RP. Mod dorsi/plantar flexion. No numbness or tingling reported. Mepilex dressing to abd, CDI. Pt is w/c bound, uses stedy to transfer to chair. Bed/Chair alarm on. Call bell and essentials within reach. Frequent rounding. Patient/Family acknowledge understanding of fall prevention education including to call nurse with assistance with ambulation.0830: Tolerating breakfast well. Sch'd meds administered per [facility]. Ate 50% of tray.1000: 250cc of liquid brown output removed from ileostomy bag.1045: Settled in recliner using stedy assist x2 after working w/ PT. Denies pain at this time.1115: PO abx, Ceftin administered per MAR.1230: Tolerated lunch well. Ate 50% of tray, drank 12oz of pepsi.1400: Sleeping. No s/s of distress. Breathing unlabored. 1600: Resting in chair. Visitor at bedside. All other need met at this time.1800: Transferred assist x2 w/ stedy back to bed. Fresh diaper placed. 250cc of brown liquid output from illeostomy. 300cc of light pink urine strained.Problem: Adult Inpatient Plan of CareGoal: Plan of Care ReviewOutcome: Interventions implemented as appropriate Problem: Adult Inpatient Plan of CareGoal: Readiness for Transition of CareOutcome: Interventions implemented as appropriate Problem: Wound Healing ProgressionGoal: Optimal Wound HealingOutcome: Interventions implemented as appropriate Problem: Skin Injury Risk IncreasedGoal: Skin Health and IntegrityOutcome: Interventions implemented as appropriate

## 2023-06-30 NOTE — Plan of Care
Nutrition Initial Assessment:67 year old female who is a LTC resident of [facility] admitted on (7/5) with Sepsis secondary to ascending UTI with stone, bacteremia, metabolic encephalopathy. Pt is s/p b/l ureteral stents. Pt is currently tolerating Cardiac diet with fluctuating po intake between poor and fair. R.N reported that pt had 1/2 bagel and cream cheese this morning for Breakfast, ~ 50% of Lunch and majority of Dinner. Met with pt in bed watching T.V, holding remote control, c/o missing a phone call. Phone wasn't currently ringing. Moved room phone in front of pt. Pt observed with missing middle bottom teeth. Pt denied having a bridge or difficulty chewing at this time time. During interview pt closed eyes and tensed up her upper body. When asked if she was okay, pt reported, oh the pain, the pain. Not able to report where the pain was. R.N immediately notified. Labs and medications noted. Pt noted with ~13 % wt loss x 1 year which isn't severe but significant. Due to sub optimal po intake and visible signs of muscle wasting and loss of sub cutaneous fat, pt met criteria for malnutrition, see separate note. PMHx:DVT on apixaban, perforated diverticulitis s/p hemicolectomy/ileosstomy, RA (bedbound), Anemia, Cellulitis, abscess of right lower extremity, Dysphagia, Encephalopathy, GERD (gastroesophageal reflux disease), Hypercalcemia, Paraplegia, Rheumatoid arthritis, recurring UTI /Kidney stonesWeight history: Wt: 06/28/23 65.6 kg 06/15/23 59.4 kg 05/24/23 62.2 kg 03/26/23 54.9 kg 03/10/23 63.6 kg 01/20/23 59 kg 08/05/23 74.9 kg 07/01/23 75 kg Food Allergies: NKFASkin: Moisture associated dermatitis medial gluteal cleftBowels/GI: LBM (7/9) Ileostomy Pain: Being addressedMedications include: Current Facility-Administered Medications Medication  apixaban  cefuroxime  gabapentin  gabapentin  polyethylene glycol  sodium chloride Nutrition Related Labs include:   Chemistry  Lab Results Component Value Date  NA 145 06/30/2023  K 3.5 06/30/2023  CL 116 (H) 06/30/2023  CO2 26 06/30/2023  BUN 9 06/30/2023  CREATININE 0.71 06/30/2023  GLU 93 06/30/2023  Lab Results Component Value Date  CALCIUM 10.1 06/30/2023  ALKPHOS 62 06/30/2023  AST 11 06/30/2023  ALT 8 (L) 06/30/2023  BILITOT 0.3 06/30/2023   Results in Past 7 DaysResult Component Current Result Hematocrit 30.40 (L) (06/30/2023) Hemoglobin 9.2 (L) (06/30/2023) MCH 26.9 (L) (06/30/2023) MCHC 30.3 (L) (06/30/2023) MCV 88.9 (06/30/2023) MPV 10.9 (06/30/2023) Platelets 209 (06/30/2023) RBC 3.42 (L) (06/30/2023) WBC 5.9 (06/30/2023) Lab Results Component Value Date  HGBA1C 5.5 03/09/2023  Nutritional AssessmentTimepoint: AssessmentReason For Assessment: per organizational policy, identified at risk by screening criteria, RD screen Patient Reported Diet/Restrictions/Preferences: regularCurrent StateCurrent Appetite: fairSpecialty Diet/Nutrition Received: cardiacDiet/Feeding Assistance: tray set-upDiet/Feeding Tolerance: poorNutrition Interventions: food preferences provided, meal setup provided, supplemental drinks providedNutrition Risk Screen: reduced oral intake over the last monthAnthropometric MeasurementsHeight: 5' 10 (177.8 cm) Weight: 65.6 kg Weight Method: Actual Weight Scale Used: Bed  BMI (Calculated): 20.8  Admit Weight: 65.6 kg  Nutrition Focused Physical FindingsOverall Physical Appearance: loss of muscle mass, loss of subcutaneous fatGastrointestinal: ostomyOral/Mouth Cavity: poor dentitionSkin: dermatitisEstimated Nutritional NeedsTotal Energy Estimated Needs: 4034-7425 kcal /dayMethod for Estimating Needs: 30-35 kcal /kg bwTotal Protein Estimated Needs: 65.6-79 g protein /dayMethod for Estimating Needs: 1-1.2 g protein /kg bwTotal Fluid Estimated Needs: 9563-8756 mL /dayMethod for Estimating Needs: 1 mL /kg bwNutrition OrderNutrition Order: meets nutritional requirementsNutrition RiskDate of Last RD visit: 07/09/24Date of next RD visit: 07/07/23 (f/u)Nutrition Risk: patient at nutritional riskLevel of Risk: moderateNutrition DiagnosisNutrition Diagnosis/Problem: Predicted suboptimal nutrient intakeRelated to: Increased demand for nutrientsAs Evidenced by: Pt with sub optimal po intake admitted with Sepsis noted with fluctuating po intake between poor and fairMalnutrition Diagnosis -  Adult: MalnutritionRECOMMENDATIONS:Continue with Cardiac diet as toleratedMonitor food intake / tolerance, provide food preferencesContinue to document % of meals and supplements consumedContinue skin and ileostomy careContinue with bowel regimenWeekly weights to trend INTERVENTIONS:Nutrition Supplement Ensure Enlive ordered Case reviewedRD to remain available and to follow clinical course MONITORING / EVALUATION: Blood sugar - goal: fasting/pre-meal 100 - 140 mg/dl; random <161 mg/dlSkin integrity - goal: skin intactWeight - goal: dry weight stable PO intake: goal: >/= 75% of estimated needs through meals/supplements with good toleranceFollow up per protocol Silas Sacramento RDN [phone] or [facility]

## 2023-06-30 NOTE — Other
Malnutrition IdentificationPt meets criteria for Moderate Malnutrition based on the following identified criteria:Energy Intake: Acute Illness/Injury: <75% for >7 days  Body Fat:All Etiologies: Mild DepletionMuscle Mass: All Etiologies: Mild Depletion:  Electronically signed by Silas Sacramento, RD, June 30, 2023

## 2023-07-01 ENCOUNTER — Telehealth: Admit: 2023-07-01 | Payer: PRIVATE HEALTH INSURANCE | Attending: Urology | Primary: Nephrology

## 2023-07-01 DIAGNOSIS — Z1152 Encounter for screening for COVID-19: Secondary | ICD-10-CM

## 2023-07-01 DIAGNOSIS — Z7901 Long term (current) use of anticoagulants: Secondary | ICD-10-CM

## 2023-07-01 DIAGNOSIS — G8220 Paraplegia, unspecified: Secondary | ICD-10-CM

## 2023-07-01 DIAGNOSIS — E785 Hyperlipidemia, unspecified: Secondary | ICD-10-CM

## 2023-07-01 DIAGNOSIS — N179 Acute kidney failure, unspecified: Secondary | ICD-10-CM

## 2023-07-01 DIAGNOSIS — D849 Immunodeficiency, unspecified: Secondary | ICD-10-CM

## 2023-07-01 DIAGNOSIS — R310 Gross hematuria: Secondary | ICD-10-CM

## 2023-07-01 DIAGNOSIS — Z9049 Acquired absence of other specified parts of digestive tract: Secondary | ICD-10-CM

## 2023-07-01 DIAGNOSIS — B9620 Unspecified Escherichia coli [E. coli] as the cause of diseases classified elsewhere: Secondary | ICD-10-CM

## 2023-07-01 DIAGNOSIS — R627 Adult failure to thrive: Secondary | ICD-10-CM

## 2023-07-01 DIAGNOSIS — Z933 Colostomy status: Secondary | ICD-10-CM

## 2023-07-01 DIAGNOSIS — Z8619 Personal history of other infectious and parasitic diseases: Secondary | ICD-10-CM

## 2023-07-01 DIAGNOSIS — N136 Pyonephrosis: Secondary | ICD-10-CM

## 2023-07-01 DIAGNOSIS — E8809 Other disorders of plasma-protein metabolism, not elsewhere classified: Secondary | ICD-10-CM

## 2023-07-01 DIAGNOSIS — B961 Klebsiella pneumoniae [K. pneumoniae] as the cause of diseases classified elsewhere: Secondary | ICD-10-CM

## 2023-07-01 DIAGNOSIS — A419 Sepsis, unspecified organism: Secondary | ICD-10-CM

## 2023-07-01 DIAGNOSIS — Z932 Ileostomy status: Secondary | ICD-10-CM

## 2023-07-01 DIAGNOSIS — R0902 Hypoxemia: Secondary | ICD-10-CM

## 2023-07-01 DIAGNOSIS — R64 Cachexia: Secondary | ICD-10-CM

## 2023-07-01 DIAGNOSIS — R6521 Severe sepsis with septic shock: Secondary | ICD-10-CM

## 2023-07-01 DIAGNOSIS — Z87440 Personal history of urinary (tract) infections: Secondary | ICD-10-CM

## 2023-07-01 DIAGNOSIS — G9341 Metabolic encephalopathy: Secondary | ICD-10-CM

## 2023-07-01 DIAGNOSIS — Z79899 Other long term (current) drug therapy: Secondary | ICD-10-CM

## 2023-07-01 DIAGNOSIS — E440 Moderate protein-calorie malnutrition: Secondary | ICD-10-CM

## 2023-07-01 DIAGNOSIS — Z7952 Long term (current) use of systemic steroids: Secondary | ICD-10-CM

## 2023-07-01 DIAGNOSIS — N202 Calculus of kidney with calculus of ureter: Secondary | ICD-10-CM

## 2023-07-01 DIAGNOSIS — A4151 Sepsis due to Escherichia coli [E. coli]: Secondary | ICD-10-CM

## 2023-07-01 DIAGNOSIS — F09 Unspecified mental disorder due to known physiological condition: Secondary | ICD-10-CM

## 2023-07-01 DIAGNOSIS — K219 Gastro-esophageal reflux disease without esophagitis: Secondary | ICD-10-CM

## 2023-07-01 DIAGNOSIS — Z86718 Personal history of other venous thrombosis and embolism: Secondary | ICD-10-CM

## 2023-07-01 DIAGNOSIS — E860 Dehydration: Secondary | ICD-10-CM

## 2023-07-01 DIAGNOSIS — M069 Rheumatoid arthritis, unspecified: Secondary | ICD-10-CM

## 2023-07-01 DIAGNOSIS — G8929 Other chronic pain: Secondary | ICD-10-CM

## 2023-07-01 DIAGNOSIS — Z6820 Body mass index (BMI) 20.0-20.9, adult: Secondary | ICD-10-CM

## 2023-07-01 DIAGNOSIS — A4159 Other Gram-negative sepsis: Secondary | ICD-10-CM

## 2023-07-01 DIAGNOSIS — Z87442 Personal history of urinary calculi: Secondary | ICD-10-CM

## 2023-07-01 MED ORDER — CEFUROXIME AXETIL 500 MG TABLET
500 | ORAL_TABLET | Freq: Two times a day (BID) | ORAL | 1 refills | Status: AC
Start: 2023-07-01 — End: ?

## 2023-07-01 NOTE — Plan of Care
Plan of Care Overview/ Patient Status    0700: Dual RN bedside handoff completed 0800: Received pt awake in bed. A+O x4. RA. Lung sounds diminished, no accessory muscle use noted. No c/o of CP or SOB. +BS. Abd soft, non-tender. No n/v/d. Tolerating cardiac diet. Voiding light pink urine to purewick. Ileostomy to RLQ, liquid brown output noted. Skin is warm, dry and intact, w/ +PP/RP. Mod dorsi/plantar flexion. No numbness or tingling reported. Mepilex dressing to abd, CDI. Pt is w/c bound, uses stedy to transfer to chair. Bed/Chair alarm on. Call bell and essentials within reach. Frequent rounding. Patient/Family acknowledge understanding of fall prevention education including to call nurse with assistance with ambulation.0830: Sch'd meds admnistered per MAR. Tramadol given for 6/10 pain to lower legs and feet. Ate 100% of breakfast tray, 1 cup of coffee and 10oz of water.0915: Assisted CNA w/ morning care. Fresh diaper, purewick placed. 50cc of soft brown stool removed from ileostomy and pt dressed for dc.1610: Mepilex dressing to abd replaced.1030: Report given to Venita Sheffield, Charity fundraiser at [employer].1040: Written discharge instructions printed to be sent w/ pt. IV catheters x2 discontinued intact. Site without s/s of complications. Dressing and pressure applied. 1139: Pt discharged off unit with all personal belongings via Ambulette Medical illustrator) via [facility] transport in stable condition.Problem: Adult Inpatient Plan of CareGoal: Plan of Care ReviewOutcome: Interventions implemented as appropriate Problem: Adult Inpatient Plan of CareGoal: Optimal Comfort and WellbeingOutcome: Interventions implemented as appropriate Problem: Wound Healing ProgressionGoal: Optimal Wound HealingOutcome: Interventions implemented as appropriate Problem: Fall Injury RiskGoal: Absence of Fall and Fall-Related InjuryOutcome: Interventions implemented as appropriate

## 2023-07-01 NOTE — Telephone Encounter
Surgery has been scheduled for 7/25 at [facility] to hold the spot. The patient is scheduled as a new patient on 7/12 with Dr. Hulen Luster.  I will reach out to her after that visit.

## 2023-07-01 NOTE — Plan of Care
Plan of Care Overview/ Patient Status    0930: IMM discussed with conservator [name]; with no intent to appeal discharge. IMM given to secretary to scan in patient's chart.Plan: As per MD attending, Patient is medically clear for discharge today. Discharge today at 10:30 AM to [facility] via ambulette/wheelchair. Patient and conservator [name] in agreement with discharge plan today and ambulette fee. RN notified via [facility] to call Germaine Pomfret RN supervisor at: [phone]. AVS/W10 to accompany patient to facility; RN notified and aware via [facility]. Geanie Cooley, BSN, RN-BCNurse Case ManagerO: [phone]: [phone]

## 2023-07-01 NOTE — Discharge Summary
[facility] Dis[facility] SummaryPatient Data:  Patient Name: Danielle [name] Admit date: 06/26/2023 Age: 67 y.o. Discharge date: 07/01/23 DOB: 1956-12-17	 Discharge Attending Physician: No att. providers found  MRN: [id number]	 Discharged Condition: stable PCP: Manson Passey, MD  Disposition: Skilled Nursing Facility for Long Term Care Principal Diagnosis: - septic shock due to UTI 2/2 obstructing kidney stone Comorbidities Comorbidities present on admission:- chronic anemia - DVT - RA - hx perf diverticulitis s/p hemicolectomy/ileostomy   Post Discharge Follow Up Items: Issues to be Addressed Post Discharge:Follow up with urologist on 7/12 to address the bilateral stent placed on 7/7 Follow up with hematologist regarding chronic [facility] Pending Labs and Tests: Pending Lab Results   Order Current Status  Partial thromboplastin time Collected (06/27/23 0138)  Partial thromboplastin time Collected (06/27/23 0138)  Larina Bras analysis In process  Follow-up Information:Stroumbakis, Christella [facility]a [facility] [address] [phone] on 7/12/2024Follow-up, Post-op Visit on 07/03/23 at 1:30 PMNATHANIEL Venda [address] [place] [phone] Future Appointments Date Time Provider Department Center 07/03/2023  1:30 PM [name], MD [facility] 09/22/2023  1:30 PM [facility] TECH [facility] DRAW 09/22/2023  2:00 PM Isaias Cowman, MD [facility] None 10/08/2023  9:45 AM [facility] [place] [facility] 10/08/2023 10:00 AM Cornell, Lise Auer, APRN [facility] 01/21/2024 11:30 AM [name], MD Endo None Hospital Course: 67 y.o. female with PMHx of DVT (eliquis), hx perforated diverticulitis s/p hemicolectomy/ileostomy, RA, hx recurrent UTI from kidney stones who is admitted for urosepsis 2/2 ureteral obstruction from R kidney stone s/p bilateral stent placement on 7/5. Initially Admitted to ICU for pressor support. Urine and blood cx grew klebsiella susceptible to CTX. Patient was continued on ctx IV transitioned to po ceftin to finish 10d course of abx. Stable for transfer to [place] on 7/7. Patient maintained on heparin gtt for DVT despite hematuria. Patient has known chronic hematuria but remains on AC due to being non-mobile from underlying RA and FTT. Hb maintained stable. Follows w Dr. Nedra Hai, hematologist, outpatient. Heparin gtt transitioned to PTA eliquis and stable for discharge on 7/10.   Data: Pertinent lab findings:Recent Labs Lab 07/07/240538 07/08/240717 07/09/240549 WBC 12.8* 7.9 5.9 HGB 9.5* - 9.5* 9.1* 9.2* HCT 30.90* - 30.90* 28.80* 30.40* PLT 156 176 209  Recent Labs Lab 07/05/241521 07/05/242200 NEUTROPHILS 90.2* 92.5*  Recent Labs Lab 07/07/240538 07/08/240543 07/09/240549 NA 143 144 145 K 3.3* 3.2* 3.5 CL 115 116* 116* CO2 22 23 26  BUN 13 9 9  CREATININE 1.02 0.75 0.71 GLU 99 88 93 ANIONGAP 6 5 3*  Recent Labs Lab 07/07/240538 07/08/240543 07/09/240549 CALCIUM 9.0 9.5 10.1  Recent Labs Lab 07/07/240538 07/08/240543 07/09/240549 ALT 7* 8* 8* AST 11 9 11  ALKPHOS 60 67 62 BILITOT 0.1 0.2 0.3  Recent Labs Lab 07/05/241521 07/06/240138 07/07/240638 07/07/241334 07/08/240543 PTT  --    < > 48.1* 39.5* 42.0* LABPROT 11.8  --   --  11.4  --  INR 1.12  --   --  1.08  --   < > = values in this interval not displayed.  Microbiology:Blood cx + klebsiella Ucx + klebsiella Imaging: Hubbard A/P 7/51. Moderate obstruction of the right kidney from a 6, right UVJ calculus.2. Nonobstructing renal calculi.3. Gallstones with trace pericholecystic fluid could be secondary to the adjacent inflammatory change. Cholecystitis cannot be entirely excluded.4. Postoperative changes with extensive colonic diverticulosis.Diet:  Diet CardiacNutrition SupplementsMobility: Highest Level of mobility - ACTUAL: Mobility Level 3,  Sit on edge of bed, AM PAC 8-9Physical Therapy Disposition Recommendation: Moderate complexity Physical Exam Discharge vital signs: Vitals:  07/01/23 1110 BP: 98/62 Pulse: 70 Resp: 14 Temp: 98.6 ?F (37 ?C) Physical exam Gen:  elderly, appears weak, in NADHEENT: PERRL, no LAD, anicteric, oropharynx benign, MMMCV: nl S1 and S2, RRR, no m/r/gPulm: CTAB, no wheezes, rales or ronchi. Breathing comfortably. GI: Soft, NT, ND, +BS. Ostomy bag on right side. No guarding or rebound tenderness. Ext: No peripheral edema, WWPNeuro: A&O x 3, EOM intact, face symmetric, intact sensation and strength throughout Skin: No rashesHistory  Allergies No Known Allergies PMH PSH Past Medical History: Diagnosis Date  Acute embolism and thrombosis of unspecified femoral vein (HC Code)   Anemia   Cellulitis and abscess of right lower extremity   Dysphagia   Encephalopathy   GERD (gastroesophageal reflux disease)   Hypercalcemia   Paraplegia (HC Code)   Rheumatoid arthritis (HC Code)   Past Surgical History: Procedure Laterality Date  BAND HEMORRHOIDECTOMY    CYSTECTOMY  1972  LAPAROSCOPIC SALPINGOOPHERECTOMY  1986  LAPAROTOMY  2022  PARTIAL COLECTOMY  2022  PEG TUBE PLACEMENT  01/2022  UPPER GASTROINTESTINAL ENDOSCOPY    Social History Family History Social History Tobacco Use  Smoking status: Never  Smokeless tobacco: Never Substance Use Topics  Alcohol use: Not on file  No family history on file.   Discharge Medications  Discharge: Current Discharge Medication List  START taking these medications  Details cefuroxime (CEFTIN) 500 mg tablet Take 1 tablet (500 mg total) by mouth 2 (two) times daily for 4 days.Qty: 8 tablet, Refills: 0Start date: 07/01/2023, End date: 07/05/2023   CONTINUE these medications which have NOT CHANGED  Details acetaminophen (TYLENOL) 325 mg tablet Take 2 tablets (650 mg total) by mouth every 8 (eight) hours as needed for pain.Qty: 30 tablet, Refills: 0  apixaban (ELIQUIS) 5 mg tablet 1 tablet (5 mg total) by Per G Tube route every 12 (twelve) hours.  ferrous gluconate (FERGON) 324 mg (38 mg iron) tablet Take 1 tablet (324 mg total) by mouth every other day.  folic acid (FOLVITE) 1 mg tablet Take 1 tablet (1 mg total) by mouth daily.Qty: 30 tablet, Refills: 11  gabapentin (NEURONTIN) 400 mg capsule Take 1 capsule (400 mg total) by mouth 2 (two) times daily with breakfast and dinner.  gabapentin (NEURONTIN) 600 mg tablet Take 1 tablet (600 mg total) by mouth nightly.  hydrOXYchloroQUINE (PLAQUENIL) 200 mg tablet Take 1 tablet (200 mg total) by mouth daily.Qty: 14 tablet, Refills: 0  melatonin 3 mg tablet Take 1 tablet (3 mg total) by mouth nightly.Refills: 0  pantoprazole (PROTONIX) 40 mg tablet Take 1 tablet (40 mg total) by mouth daily.Qty: 30 tablet, Refills: 11  rosuvastatin (CRESTOR) 10 mg tablet 1 tablet (10 mg total) by Per G Tube route nightly.  tamsulosin (FLOMAX) 0.4 mg 24 hr capsule Take 1 capsule (0.4 mg total) by mouth nightly.Qty: 30 capsule, Refills: 0  traMADoL (ULTRAM) 50 mg tablet Take 1 tablet (50 mg total) by mouth every 8 (eight) hours as needed for pain.     Electronically Signed:Soo-Hyun Nedra Hai, MD PGY-2Date: 7/10/2024Time: 12:09 PM

## 2023-07-01 NOTE — Plan of Care
Plan of Care Overview/ Patient Status    1900Dual RN bedside handoff completedA&Ox4, can be forgetful, RA, no resp distress, no SOB or chest pain, ABD soft nontender, Ostomy to LLQ w/ liquid brown stool, mepilex to LLQ CDI from old tube site, old ABD incision CDI, incontinent, purewick in place, +PP, +CSM, denies numbness/tingling to all extremities, wheelchair bound at baseline, OOB to chair w/ sara lift, IV CDI, essentials in reach, frequent rounding Patient/Family acknowledge understanding of fall prevention education including to call nurse with assistance with ambulation and video monitoring No2056Scheduled meds given, see MAR 2130Pt asleep, no distress 0000Pt ostomy emptied, no distress 0200Pt asleep, no distress 0400Pt asleep, no distress0500Pt refused morning labs at this time, would like it done later0625Ostomy emptied, no distress

## 2023-07-01 NOTE — Discharge Instructions
During this admission you were cared for by Dr Isaiah Blakes, Edwena Felty, MD and the Internal Medicine Resident team. You were admitted for urinary tract infection due to kidney stone. We treated you with antibiotics. We also placed stent in your kidney tract to help pass the stone. You need to follow up with urologist, Dr Meredith Leeds, on 7/12 at 1:30 PMContinue to take ceftin 500mg  twice per day until 7/14. Things to watch out for after discharge: If you experience any of the following symptoms, please do not hesitate to return to our emergency department for re-evaluation:- fever- chest pain, shortness of breath - loss of consciousness - blood in stool/blood in urine  Follow FA:OZHYQMVHQIO, Janyth Pupa, MD[address] [facility] [address] [phone] an appointment as soon as possible for a visit in 1 week(s)Follow-up, management of ureteral stents In general, it is best to be seen by your primary care physician within one to two weeks from a hospital discharge.Should you have any questions regarding your hospital stay, please call [phone]. Best of luck!

## 2023-07-03 ENCOUNTER — Ambulatory Visit: Admit: 2023-07-03 | Payer: MEDICARE | Attending: Urology | Primary: Nephrology

## 2023-07-03 ENCOUNTER — Telehealth: Admit: 2023-07-03 | Payer: PRIVATE HEALTH INSURANCE | Attending: Urology | Primary: Nephrology

## 2023-07-03 ENCOUNTER — Encounter: Admit: 2023-07-03 | Payer: PRIVATE HEALTH INSURANCE | Attending: Urology | Primary: Nephrology

## 2023-07-03 DIAGNOSIS — L03115 Cellulitis of right lower limb: Secondary | ICD-10-CM

## 2023-07-03 DIAGNOSIS — G8220 Paraplegia, unspecified: Secondary | ICD-10-CM

## 2023-07-03 DIAGNOSIS — D649 Anemia, unspecified: Secondary | ICD-10-CM

## 2023-07-03 DIAGNOSIS — G9340 Encephalopathy, unspecified: Secondary | ICD-10-CM

## 2023-07-03 DIAGNOSIS — R1310 Dysphagia, unspecified: Secondary | ICD-10-CM

## 2023-07-03 DIAGNOSIS — I82419 Acute embolism and thrombosis of unspecified femoral vein: Secondary | ICD-10-CM

## 2023-07-03 DIAGNOSIS — N399 Disorder of urinary system, unspecified: Secondary | ICD-10-CM

## 2023-07-03 DIAGNOSIS — K219 Gastro-esophageal reflux disease without esophagitis: Secondary | ICD-10-CM

## 2023-07-03 DIAGNOSIS — N201 Calculus of ureter: Secondary | ICD-10-CM

## 2023-07-03 DIAGNOSIS — M069 Rheumatoid arthritis, unspecified: Secondary | ICD-10-CM

## 2023-07-03 LAB — URINALYSIS-MACROSCOPIC W/REFLEX MICROSCOPIC
BKR BILIRUBIN, UA: NEGATIVE
BKR GLUCOSE, UA: NEGATIVE
BKR KETONES, UA: NEGATIVE
BKR NITRITE, UA: NEGATIVE
BKR PH, UA: 6.5 /HPF (ref 5.5–7.5)
BKR SPECIFIC GRAVITY, UA: 1.015 (ref 1.005–1.030)
BKR UROBILINOGEN, UA (MG/DL): <2 mg/dL (ref <=2.0)

## 2023-07-03 LAB — URINE MICROSCOPIC     (BH GH LMW YH)
BKR RBC/HPF INSTRUMENT: 515 /HPF — ABNORMAL HIGH (ref 0–2)
BKR URINE SQUAMOUS EPITHELIAL CELLS, UA (NUMERIC): <1 /HPF (ref 0–5)
BKR WBC/HPF INSTRUMENT: 48 /HPF — ABNORMAL HIGH (ref 0–5)

## 2023-07-03 NOTE — Telephone Encounter
Spoke with Danielle [name] and informed surgical consent was obtained via telephone by Danielle [name]. Asked that Danielle [name] be available the day of surgery if anesthesia needs to reach her. Danielle [name] verbalized understanding and thanked for the call.

## 2023-07-03 NOTE — Patient Instructions
[facility] Pre-Op InstructionsName of surgery: CYSTOSCOPY LEFT URETEROSCOPY LASER LITHOTRIPSY, STENT PLACEMENT Surgeon Name: Dr. Tomasita Morrow Scheduler: Misty Stanley VeceSurgery Date: 7/25/2024A scheduler will contact you to discuss the surgical scheduling process, and if you have any special date requests please make [place] aware of those dates when we call. The scheduling process must consider many factors and please know that we are working to accommodate every patient according to urgency of the surgical case and schedule. In some cases, they may hold a scheduling date for you that you will see in MyChart; rest assured if that date does not work with your schedule, the surgical scheduler will work with you to arrange a time that is convenient and appropriate for your care.Surgery Time: On the day before surgery, you will be notified by an automated text or phone call between 3:00 pm-5:00 pm regarding the time to report to the hospital.If you miss this call, you can contact:[phone] [phone] [place], [place])Medical Leave Of Absence Documentation for Employers:If you need forms filled out for your employer, you can either attach them to a MyChart message or have them faxed to [phone]. Please allow 1- 2 weeks for completion, as the surgeons are not in the office every day. Before SurgeryYou will receive a call from a nurse in Pre-Surgical Evaluation to review your medical history and instructions before your surgery. Please have an updated list of your medications and allergies ready. If needed, you may also be seen by the anesthesiology team in pre-admission testing. If you get a cold, high fever, chills, body aches, sore throat, loss of smell, are not feeling well or become pregnant, please call your surgeon's office as soon as possible. Your surgery may need to be rescheduled.If you have been diagnosed with COVID-19 within 7 weeks of your surgery date, please let us know. Your surgeon will need to determine if it will be safe for you to undergo a surgical procedure.Please do not schedule/receive any vaccines 48 hours before or after surgery.Pre-Op Testing Requirements 1-2 Weeks Before Surgery: Blood Work and Urine CultureIf you take aspirin, please check with your surgeon if you need to stop before surgery.-If you are taking blood thinners/anticoagulants, it is extremely important that you talk to your surgeon and prescribing physician to see if you should continue these medications or not before surgeryNo herbal medications or vitamins for 7 days before your procedure.Do not take other non-steroidal anti-inflammatory medications (NSAIDs) for 7 days before surgery. These include but are not limited to: Ibuprofen/Motrin, Advil, Daypro, Naprosyn or Feldene.If you are having pain or discomfort, it is okay to take plain acetaminophen (Tylenol) unless you have been instructed otherwise.Q. Do you take a GLP-1 agonist for weight loss OR to help manage diabetes?      If you are unsure- please check with your prescribing doctorFor DAILY dosing (including oral tablets), do not take your GLP-1 agonist on the day of your procedure/surgery (examples include Byetta /exentatide IR; Victoza /liraglutide; Adlyxin / lixisenatide; Rybelsus /semaglutide, others)For WEEKLY dosing, hold your GLP-1 agonist at least one week (7 days) prior to your procedure/surgery (examples include Trulicity /dulaglutide; Bydureon /exenatide; Ozempic & Wegovy /semaglutide, others)If your GLP-1 agonist is prescribed for diabetes management and is stopped for longer than the dosing schedule, you may need to see your prescribing physician for interim medication to avoid the risk of high blood sugar before surgery.Please note: This is not a comprehensive list. New medications are being developed. Please ask if you are unsure. You will receive a  call approximately 1 week prior to surgery from Pre Surgical Evaluation, who will review the remainder of your medications with you.Day before/ morning of SurgeryShower, and wash hair, paying special attention to the surgery site. Get clean, comfortable clothes ready for day of surgery. Do not drink alcoholBowel Prep: n/aNO solid food after midnight the night before surgery/ procedure and follow specialized instructions below - (check the option that applies to you)The following conditions put you at an increased risk for aspiration. For your safety, if you have any of the following, do not drink any liquids after midnight.Take insulin for diabetes Hiatal hernia Gastroparesis Zenker's Diverticulum Achalasia Esophageal stricture Stomach or esophageal cancer Take daily narcotic pain medication  Severe heartburn or GERD (gastroesophageal reflux) that affects you more than twice a week, causes you to wake up at night with burning, food or acid regurgitation.  History of stomach or esophageal surgery (For stomach or esophageal cancer, gastric bypass, Nissen fundoplication) Daily or almost daily nausea or vomiting in the week leading up to your surgery  Patients without the above restrictions will be able to have clear liquids until two (2) hours before hospital arrival time. Acceptable clear liquids are: clear apple juice, cranberry juice, sports drinks, water, black tea/ coffee - NO milk, cream, smoothies or shakes or surgery may be cancelled or delayed. Limit caffeinated drinks to 12 ounces.You may take approved medications in the morning with a sip of water as instructed by pre admission testing.Morning of surgeryBrush teethPlease drink one 16 oz cup of apple juice or Sports Drink like Gatorade 2 hours prior to your arrival to the hospital.Important InformationYou should have someone at home for at least the first overnight after surgery.You may NOT drive yourself home after anesthesia. A responsible adult must accompany you in taking a car, cab, bus, train or van.You may not drive with 16XWR of having anesthesia. It is illegal to drive while taking narcotic pain medication (driving impaired).What to bring to the hospital?Bring your insurance card and ID with you to the hospital.A list of all your medications with doses and time of day taken- (please include over the counter medications, herbal supplements, aspirin, inhalers, creams, patches, etc.).CPAP machine for sleep apnea labeled with your name (if you use), please bring it with you on the day of your procedure.What to leave at [facility] contacts, body/facial piercings, all rings, jewelry including earrings, hearing aids and dentures. Please leave at home.Remove and leave contact lenses at home if possible. If this is not an option, please bring solution and a storage case.Large amounts of cash/ valuablesBillingPlease inform us immediately if you have any changes to your insurance. You can call the registration department at [phone] to make the changes. Billing instructions for [facility], [place], [place], [place], and [facility] should be directed to the [employer] at [phone]. This is our hospital billing call center and this team responds to inquiries for all [facility]. Our hours of operation are Monday - Friday 730am - 5pm.Billing instructions for [facility] Patients): should be directed to the [facility] Billing Team at [phone]. Our Hours of operation are Monday - Friday 8:30am- 5pm.  Last reviewed: 03/2023

## 2023-07-03 NOTE — Telephone Encounter
[facility] MESSAGETime of call:   2:53 PMCaller:   McCulley,Leona RCaller's relationship to patient:  Doctor, general practice from [employer], hospital, agency, etc.):  home Reason for call:   returning a call to a nurse regarding a message left to her brother about authorizing a left stem on the patient. Caller would like a call back to discuss. Best telephone number for callback:   [phone] Best time to return call:   anytime Permission to leave message:  yes   [facility] Referral Specialist

## 2023-07-04 LAB — URINE CULTURE

## 2023-07-06 LAB — KIDNEY STONE ANALYSIS     (BH GH L LMW Q YMG)

## 2023-07-06 NOTE — Progress Notes
bilateral stent placement passage of right ureteral stone asymptomatic left proximal ureteral stone  HPICrystal A [name] is a 67 y.o. female  status post bilateral stent placement for urosepsisPatient had passed a right ureteral stone has not asymptomatic left ureteral stonePatient more alert todayUrine blood-tinged07/07/2023 patient looks reasonably well with a clear understanding what is going onPatient with Klebsiella and urine of presses urine blood-tinged improving\WBC down to 7.9 from 22 creatinine 0.757/11/2023 sp bilateral stent passed stone r uretr has left midOn ceftin sepsis PAR for urs on left remove stent rightUa wbc  HPI Past Medical HistoryPast Medical History    Past Medical History: Diagnosis Date  Acute embolism and thrombosis of unspecified femoral vein (HC Code)    Anemia    Cellulitis and abscess of right lower extremity    Dysphagia    Encephalopathy    GERD (gastroesophageal reflux disease)    Hypercalcemia    Paraplegia (HC Code)    Rheumatoid arthritis (HC Code)     Past Surgical HistoryPast Surgical History     Past Surgical History: Procedure Laterality Date  BAND HEMORRHOIDECTOMY      CYSTECTOMY   1972  LAPAROSCOPIC SALPINGOOPHERECTOMY   1986  LAPAROTOMY   2022  PARTIAL COLECTOMY   2022  PEG TUBE PLACEMENT   01/2022  UPPER GASTROINTESTINAL ENDOSCOPY       AllergiesNo Known [email or web address] @ Social HistorySocial History     Tobacco Use  Smoking status: Never  Smokeless tobacco: Never  Family History No family history on file. Review of SystemsNo symptoms beyond those noted in HPI and PMH- all other systems are negative Physical ExamBP 103/66  - Pulse 78  - Temp 99.8 ?F (37.7 ?C) (Temporal)  - Resp 20  - Ht 5' 10 (1.778 m)  - Wt 65.6 kg  - SpO2 95%  - BMI 20.75 kg/m?      Lab Results Component Value Date   WBC 7.9 06/29/2023   HGB 9.1 (L) 06/29/2023   HCT 28.80 (L) 06/29/2023   PLT 176 06/29/2023   ALT 8 (L) 06/29/2023   AST 9 06/29/2023   NA 144 06/29/2023   K 3.2 (L) 06/29/2023   CL 116 (H) 06/29/2023   CREATININE 0.75 06/29/2023   BUN 9 06/29/2023   CO2 23 06/29/2023   TSH 0.527 03/09/2023   INR 1.08 06/28/2023   GLU 88 06/29/2023   HGBA1C 5.5 03/09/2023   No results found for: UCOLOR, UGLUCOSE, UKETONE, USPECGRAVITY, UPH, UPROTEIN, UNITRATES, UBLOOD, ULEUKOCYTES, UA  No results found for: PVRPOCT     Previous resultsNo results found for: TESTOSTERONE  AUA Symptom Score today:       No data to display        PATHOLOGY AND BIOPSIES   TEST RESULTS   Images     DISCUSSION       FL OR Urography Retrograde Result Date: 06/27/2023 Intraoperative fluoroscopy was utilized for bilateral retrograde injections and bilateral ureteral stent placements. Reported and signed by:  Carolan Clines, MD  [place] Abdomen Pelvis wo IV Contrast (No Oral)  (Renal Stone) Result Date: 06/26/2023 1. Moderate obstruction of the right kidney from a 6, right UVJ calculus. 2. Nonobstructing renal calculi. 3. Gallstones with trace pericholecystic fluid could be secondary to the adjacent inflammatory change. Cholecystitis cannot be entirely excluded. 4. Postoperative changes with extensive colonic diverticulosis. Reported and signed by:  Carolan Clines, MD  XR Chest PA or AP Result Date: 7/5/2024Limited AP view- No acute abnormality. [facility] Radiology Notify System Classification: Routine.  Reported and signed by:  Mirian Mo  US Duplex Lower Extremity Venous Bilat Result Date: 06/19/2023 No DVT of the right or left lower extremity. [facility] Radiology Notify System Classification: Routine. Reported and signed by:  Lonia Chimera, MD  [place] Abdomen Pelvis w IV Contrast (No Oral) Result Date: 05/24/2023  1. Extensive colonic diverticulosis without Delleker evidence for diverticulitis. No definite contrast extravasation is seen to identify a source of bleeding. 2. Nonobstructing right renal calculi. Delayed left nephrogram with a new 5 mm calculus projecting at the level of the left iliac crest highly suspicious for a left ureteral calculus Dr. Alena Bills discussed these findings with Danielle Constant, PA at 2:56 AM. I discussed these findings with Dr. Rinaldo Ratel at 9:30 AM. Radiology Notify System Classification: Clinical team aware. Individual dose optimization technique was used for the performance of this procedure, and one or more of the following dose reduction techniques was used: Automated exposure control and/or adjustment of the MA and/or kV according to patient size and/or the use of iterative reconstruction technique. Reported and signed by:  Geroge Baseman, MD  CXR portable Result Date: 03/09/2023  No active cardiopulmonary abnormalities.  No change. [facility]: Routine. Reported and signed by: Amie Critchley, MD    Assessment & Plan: Danielle [name] is a 67 y.o. bilateral stent  Passed stone on right has stent Left mid urter URS 2 week  Stop elliqous 2 days [facility] 5 days labs   Orders Placed This Encounter Procedures  Blood culture  SARS-CoV-2 (COVID-19)/Influenza A+B/RSV by RT-PCR ([facility])  Urine culture  Urine culture  Anaerobic Blood Culture Identification PCR (Lab Orderable Only)([facility])  XR Chest PA or AP  [place] Abdomen Pelvis wo IV Contrast (No Oral)  (Renal Stone)  FL OR Urography Retrograde  XR Abdomen AP  Lactic acid, plasma (reflex 2h repeat)  Protime and INR  Urinalysis with culture reflex (obtain straight cath if patient unable to provide sample)  CBC auto differential  Comprehensive metabolic panel  UA reflex to culture  Urinalysis with culture reflex     ([facility])  Urine microscopic     ([facility]) Magnesium  CBC auto differential  Procalcitonin     ([facility] LM[facility] Q [facility])  C-reactive protein (CRP)  Stone analysis  CBC No diff  Partial thromboplastin time  Partial thromboplastin time  Cortisol  CBC No differential  Partial thromboplastin time  Lactic acid, plasma (reflex 2h repeat)  Partial thromboplastin time  Comprehensive metabolic panel  C-reactive protein (CRP)  Hemoglobin and hematocrit, blood  Partial thromboplastin time     ([facility] Q [facility])  Partial thromboplastin time  Comprehensive metabolic panel  Partial thromboplastin time  PT/INR and PTT  Partial thromboplastin time  Comprehensive metabolic panel  CBC No differential  Diet Cardiac  Sepsis Alert  Strain all urine  Discontinue PACU Orders  Remove Urinary Catheter  Full Code  PT Eval and Treat  Incentive Spirometry  CARDIAC EKG RESULT SCAN  CARDIAC MISC.  RESULT SCAN  CARDIAC MISC.  RESULT SCAN  CARDIAC MISC.  RESULT SCAN  CARDIAC MISC.  RESULT SCAN  CARDIAC MISC.  RESULT SCAN  EKG  Type and screen  ED Admit to Inpatient  Transfer patient  Transfer patient  Transfer patient  Transfer patient  Transfer patient  CBC and differential  Comprehensive metabolic panel  CBC and Differential  Comprehensive metabolic panel  Place in ED Observation

## 2023-07-06 NOTE — Progress Notes
Phone consent obtained with Brother Joana Reamer for upcoming surgery.Paperwork filled out for [employer].

## 2023-07-06 NOTE — Progress Notes
Danielle [name] presents to the office for strait catheter urine specimen. Using sterile technique 90mL of cloudy amber urine obtained. No pain/discomfort. No issues noted. Will send for ua/culture as ordered. Pre-op instructions reviewed with Cai. Instructions provided at handout.

## 2023-07-09 ENCOUNTER — Other Ambulatory Visit: Admit: 2023-07-09 | Payer: MEDICARE | Attending: Nephrology | Primary: Nephrology

## 2023-07-09 DIAGNOSIS — D649 Anemia, unspecified: Secondary | ICD-10-CM

## 2023-07-09 LAB — BASIC METABOLIC PANEL
BKR ANION GAP: 7 (ref 5–18)
BKR BLOOD UREA NITROGEN: 19 mg/dL (ref 8–25)
BKR BUN / CREAT RATIO: 25.7 — ABNORMAL HIGH (ref 8.0–25.0)
BKR CALCIUM: 10.7 mg/dL — ABNORMAL HIGH (ref 8.4–10.3)
BKR CHLORIDE: 112 mmol/L (ref 95–115)
BKR CO2: 21 mmol/L (ref 21–32)
BKR CREATININE: 0.74 mg/dL (ref 0.50–1.30)
BKR EGFR, CREATININE (CKD-EPI 2021): >60 mL/min/{1.73_m2} (ref >=60)
BKR GLUCOSE: 88 mg/dL (ref 70–100)
BKR OSMOLALITY CALCULATION: 281 mosm/kg (ref 275–295)
BKR POTASSIUM: 4.1 mmol/L (ref 3.5–5.1)
BKR SODIUM: 140 mmol/L (ref 136–145)

## 2023-07-09 LAB — CBC WITH AUTO DIFFERENTIAL
BKR WAM ABSOLUTE IMMATURE GRANULOCYTES.: 0.01 x 1000/ÂµL (ref 0.00–0.30)
BKR WAM ABSOLUTE LYMPHOCYTE COUNT.: 1.06 x 1000/ÂµL (ref 0.60–3.70)
BKR WAM ABSOLUTE NRBC (2 DEC): 0 x 1000/ÂµL (ref 0.00–1.00)
BKR WAM ANC (ABSOLUTE NEUTROPHIL COUNT): 3.48 x 1000/ÂµL — ABNORMAL HIGH (ref 2.00–7.60)
BKR WAM BASOPHIL ABSOLUTE COUNT.: 0.04 x 1000/ÂµL — ABNORMAL HIGH (ref 0.00–1.00)
BKR WAM BASOPHILS: 0.7 % (ref 0.0–1.4)
BKR WAM EOSINOPHIL ABSOLUTE COUNT.: 0.33 x 1000/ÂµL (ref 0.00–1.00)
BKR WAM EOSINOPHILS: 5.9 % — ABNORMAL HIGH (ref 0.0–5.0)
BKR WAM HEMATOCRIT (2 DEC): 32.4 % — ABNORMAL LOW (ref 35.00–45.00)
BKR WAM HEMOGLOBIN: 10 g/dL — ABNORMAL LOW (ref 11.7–15.5)
BKR WAM IMMATURE GRANULOCYTES: 0.2 % (ref 0.0–1.0)
BKR WAM LYMPHOCYTES: 19 % (ref 17.0–50.0)
BKR WAM MCH (PG): 27.7 pg (ref 27.0–33.0)
BKR WAM MCHC: 30.9 g/dL — ABNORMAL LOW (ref 31.0–36.0)
BKR WAM MCV: 89.8 fL (ref 80.0–100.0)
BKR WAM MONOCYTE ABSOLUTE COUNT.: 0.67 x 1000/ÂµL (ref 0.00–1.00)
BKR WAM MONOCYTES: 12 % (ref 4.0–12.0)
BKR WAM MPV: 9.8 fL (ref 8.0–12.0)
BKR WAM NEUTROPHILS: 62.2 % (ref 39.0–72.0)
BKR WAM NUCLEATED RED BLOOD CELLS: 0 % (ref 0.0–1.0)
BKR WAM PLATELETS: 554 x1000/ÂµL — ABNORMAL HIGH (ref 150–420)
BKR WAM RDW-CV: 14.8 % — ABNORMAL HIGH (ref 11.0–15.0)
BKR WAM RED BLOOD CELL COUNT.: 3.61 M/ÂµL — ABNORMAL LOW (ref 4.00–6.00)
BKR WAM WHITE BLOOD CELL COUNT: 5.6 x1000/ÂµL (ref 4.0–11.0)

## 2023-07-09 NOTE — Telephone Encounter
Does she need medical clearance if she just had a procedure while admitted?

## 2023-07-09 NOTE — Telephone Encounter
MD says YES, needs medical clearance, thanks

## 2023-07-09 NOTE — Telephone Encounter
Medical Clearance ?Does she need medical clearance if she just had a procedure while admitted? Last note :Danielle [name] is a 67 y.o. bilateral stent  Passed stone on right has stent Left mid urter URS 2 week  Stop elliqous 2 days [facility] 5 days labs Surgery schedled 07/16/2023

## 2023-07-09 NOTE — Telephone Encounter
I left a message at the primary number asking for a call back so I can let them know she will need medical clearance.

## 2023-07-10 ENCOUNTER — Other Ambulatory Visit: Admit: 2023-07-10 | Payer: MEDICARE | Attending: Nephrology | Primary: Nephrology

## 2023-07-10 ENCOUNTER — Telehealth: Admit: 2023-07-10 | Payer: PRIVATE HEALTH INSURANCE | Attending: Urology | Primary: Nephrology

## 2023-07-10 ENCOUNTER — Encounter: Admit: 2023-07-10 | Payer: PRIVATE HEALTH INSURANCE | Attending: Urology | Primary: Nephrology

## 2023-07-10 DIAGNOSIS — A499 Bacterial infection, unspecified: Secondary | ICD-10-CM

## 2023-07-10 DIAGNOSIS — G8220 Paraplegia, unspecified: Secondary | ICD-10-CM

## 2023-07-10 DIAGNOSIS — Z932 Ileostomy status: Secondary | ICD-10-CM

## 2023-07-10 DIAGNOSIS — I82419 Acute embolism and thrombosis of unspecified femoral vein: Secondary | ICD-10-CM

## 2023-07-10 DIAGNOSIS — L03115 Cellulitis of right lower limb: Secondary | ICD-10-CM

## 2023-07-10 DIAGNOSIS — D649 Anemia, unspecified: Secondary | ICD-10-CM

## 2023-07-10 DIAGNOSIS — G9340 Encephalopathy, unspecified: Secondary | ICD-10-CM

## 2023-07-10 DIAGNOSIS — N390 Urinary tract infection, site not specified: Secondary | ICD-10-CM

## 2023-07-10 DIAGNOSIS — R1310 Dysphagia, unspecified: Secondary | ICD-10-CM

## 2023-07-10 DIAGNOSIS — R627 Adult failure to thrive: Secondary | ICD-10-CM

## 2023-07-10 DIAGNOSIS — R32 Unspecified urinary incontinence: Secondary | ICD-10-CM

## 2023-07-10 DIAGNOSIS — K5792 Diverticulitis of intestine, part unspecified, without perforation or abscess without bleeding: Secondary | ICD-10-CM

## 2023-07-10 DIAGNOSIS — K219 Gastro-esophageal reflux disease without esophagitis: Secondary | ICD-10-CM

## 2023-07-10 DIAGNOSIS — M069 Rheumatoid arthritis, unspecified: Secondary | ICD-10-CM

## 2023-07-10 DIAGNOSIS — E099 Drug or chemical induced diabetes mellitus without complications: Secondary | ICD-10-CM

## 2023-07-10 LAB — URINALYSIS-MACROSCOPIC W/REFLEX MICROSCOPIC
BKR BILIRUBIN, UA: NEGATIVE
BKR GLUCOSE, UA: NEGATIVE
BKR KETONES, UA: NEGATIVE
BKR NITRITE, UA: POSITIVE — AB
BKR PH, UA: 6 (ref 5.5–7.5)
BKR SPECIFIC GRAVITY, UA: 1.014 (ref 1.005–1.030)
BKR UROBILINOGEN, UA (MG/DL): <2 mg/dL (ref <=2.0)

## 2023-07-10 LAB — URINE MICROSCOPIC     (BH GH LMW YH)
BKR RBC/HPF INSTRUMENT: 279 /HPF — ABNORMAL HIGH (ref 0–2)
BKR URINE SQUAMOUS EPITHELIAL CELLS, UA (NUMERIC): 1 /HPF (ref 0–5)
BKR WBC/HPF INSTRUMENT: 529 /HPF — ABNORMAL HIGH (ref 0–5)

## 2023-07-10 MED ORDER — MAGNESIUM HYDROXIDE 400 MG/5 ML ORAL SUSPENSION
400 | Freq: Every day | ORAL | Status: AC | PRN
Start: 2023-07-10 — End: ?

## 2023-07-10 MED ORDER — CEFUROXIME AXETIL 500 MG TABLET
500 mg | Freq: Two times a day (BID) | ORAL | Status: AC
Start: 2023-07-10 — End: 2023-07-17

## 2023-07-10 NOTE — Telephone Encounter
Per Nursing supervisor:Patient on Ceftin 500 mg BID from 07/03/2023 - 7/25/2024Per Nurse supervisor at [facility].

## 2023-07-10 NOTE — Telephone Encounter
[facility] Pre-Op InstructionsName of surgery: Surgeon Name: Surgical Scheduler: Surgery Date: A scheduler will contact you to discuss the surgical scheduling process, and if you have any special date requests please make us aware of those dates when we call. The scheduling process must consider many factors and please know that we are working to accommodate every patient according to urgency of the surgical case and schedule. In some cases, they may hold a scheduling date for you that you will see in MyChart; rest assured if that date does not work with your schedule, the surgical scheduler will work with you to arrange a time that is convenient and appropriate for your care.Surgery Time: On the day before surgery, you will be notified by an automated text or phone call between 3:00 pm-5:00 pm regarding the time to report to the hospital.If you miss this call, you can contact:[phone] ([facility])[phone] ([place], [place])Medical Leave Of Absence Documentation for Employers:If you need forms filled out for your employer, you can either attach them to a MyChart message or have them faxed to [phone]. Please allow 1- 2 weeks for completion, as the surgeons are not in the office every day. Before SurgeryYou will receive a call from a nurse in Pre-Surgical Evaluation to review your medical history and instructions before your surgery. Please have an updated list of your medications and allergies ready. If needed, you may also be seen by the anesthesiology team in pre-admission testing. If you get a cold, high fever, chills, body aches, sore throat, loss of smell, are not feeling well or become pregnant, please call your surgeon's office as soon as possible. Your surgery may need to be rescheduled.If you have been diagnosed with COVID-19 within 7 weeks of your surgery date, please let us know. Your surgeon will need to determine if it will be safe for you to undergo a surgical procedure.Please do not schedule/receive any vaccines 48 hours before or after surgery.Pre-Op Testing Requirements 1-2 Weeks Before Surgery: Blood Work and Urine CultureIf you take aspirin, please check with your surgeon if you need to stop before surgery.-If you are taking blood thinners/anticoagulants, it is extremely important that you talk to your surgeon and prescribing physician to see if you should continue these medications or not before surgeryNo herbal medications or vitamins for 7 days before your procedure.Do not take other non-steroidal anti-inflammatory medications (NSAIDs) for 7 days before surgery. These include but are not limited to: Ibuprofen/Motrin, Advil, Daypro, Naprosyn or Feldene.If you are having pain or discomfort, it is okay to take plain acetaminophen (Tylenol) unless you have been instructed otherwise.Q. Do you take a GLP-1 agonist for weight loss OR to help manage diabetes?      If you are unsure- please check with your prescribing doctorFor DAILY dosing (including oral tablets), do not take your GLP-1 agonist on the day of your procedure/surgery (examples include Byetta /exentatide IR; Victoza /liraglutide; Adlyxin / lixisenatide; Rybelsus /semaglutide, others)For WEEKLY dosing, hold your GLP-1 agonist at least one week (7 days) prior to your procedure/surgery (examples include Trulicity /dulaglutide; Bydureon /exenatide; Ozempic & Wegovy /semaglutide, others)If your GLP-1 agonist is prescribed for diabetes management and is stopped for longer than the dosing schedule, you may need to see your prescribing physician for interim medication to avoid the risk of high blood sugar before surgery.Please note: This is not a comprehensive list. New medications are being developed. Please ask if you are unsure. You will receive a call approximately 1 week prior to surgery from Pre   Surgical Evaluation, who will review the remainder of your medications with you.Day before/ morning of SurgeryShower, and wash hair, paying special attention to the surgery site. Get clean, comfortable clothes ready for day of surgery. Do not drink alcoholNO solid food after midnight the night before surgery/ procedure and follow specialized instructions below - (check the option that applies to you)The following conditions put you at an increased risk for aspiration. For your safety, if you have any of the following, do not drink any liquids after midnight.Take insulin for diabetes Hiatal hernia Gastroparesis Zenker's Diverticulum Achalasia Esophageal stricture Stomach or esophageal cancer Take daily narcotic pain medication  Severe heartburn or GERD (gastroesophageal reflux) that affects you more than twice a week, causes you to wake up at night with burning, food or acid regurgitation.  History of stomach or esophageal surgery (For stomach or esophageal cancer, gastric bypass, Nissen fundoplication) Daily or almost daily nausea or vomiting in the week leading up to your surgery  Patients without the above restrictions will be able to have clear liquids until two (2) hours before hospital arrival time. Acceptable clear liquids are: clear apple juice, cranberry juice, sports drinks, water, black tea/ coffee - NO milk, cream, smoothies or shakes or surgery may be cancelled or delayed. Limit caffeinated drinks to 12 ounces.You may take approved medications in the morning with a sip of water as instructed by pre admission testing.Morning of surgeryBrush teethPlease drink one 16 oz cup of apple juice or Sports Drink like Gatorade 2 hours prior to your arrival to the hospital.Important InformationYou should have someone at home for at least the first overnight after surgery.You may NOT drive yourself home after anesthesia. A responsible adult must accompany you in taking a car, cab, bus, train or van.You may not drive with 24hrs of having anesthesia. It is illegal to drive while taking narcotic pain medication (driving impaired).What to bring to the hospital?Bring your insurance card and ID with you to the hospital.A list of all your medications with doses and time of day taken- (please include over the counter medications, herbal supplements, aspirin, inhalers, creams, patches, etc.).CPAP machine for sleep apnea labeled with your name (if you use), please bring it with you on the day of your procedure.What to leave at homeRemove contacts, body/facial piercings, all rings, jewelry including earrings, hearing aids and dentures. Please leave at home.Remove and leave contact lenses at home if possible. If this is not an option, please bring solution and a storage case.Large amounts of cash/ valuablesBillingPlease inform us immediately if you have any changes to your insurance. You can call the registration department at [phone] to make the changes. Billing instructions for [place], [place], [place], [facility], and [place] should be directed to the [facility] Business Office at [phone]. This is our hospital billing call center and this team responds to inquiries for all [facility] hospitals. Our hours of operation are Monday - Friday 730am - 5pm.Billing instructions for [facility] ([facility]): should be directed to the [facility] Billing Team at [phone]. Our Hours of operation are Monday - Friday 8:30am- 5pm.  Last reviewed: 03/2023

## 2023-07-10 NOTE — Telephone Encounter
-----   Message from [name], MD sent at 07/09/2023  8:52 PM EDT -----Is she on abx needs to take thru her date of surgery thursday

## 2023-07-10 NOTE — Telephone Encounter
I spoke to Danielle [name], the unit coordinator, and confirmed the surgery date and location.  She will arrange a 7:15am arrival.  She will obtain medical clearance and an EKG. Labs are in EPIC. I faxed a request to her attention.Were the Eliquis instructions given to the nursing home by our nurses?

## 2023-07-10 NOTE — Other
PAT phone interview conducted with Danielle [name] at [facility]. Pt is scheduled for cystoscopy left ureteroscopy laser lithotripsy, stent placement-left on 7/25 with Dr. Hulen Luster. Medical/Surgical Hx reviewed as well as current medications. Pt to see PCP for medical clearance. Recent blood work and EKG in [employer]. Instructed that the pt will receive text message notification of arrival time the day prior to surgery.  NPO instructions provided by surgeons office and reviewed today during phone call. PO hydration days prior to procedure encouraged. Instructed to leave all valuables at home and to bring ID and insurance card. Pt wishes to be family touch participant. Pt is wheelchair bound and requires transfer with a Hoyer lift. The patient has ileostomy present. Drenda Freeze RN at [facility] states that she was instructed by Dr. Lenn Cal office to hold Eliquis two days prior to procedure. Drenda Freeze plans to call to find out what the exact day and time of last dose should be. Drenda Freeze also to inquire with surgeons office about procedure length and transport back to facility.

## 2023-07-10 NOTE — Telephone Encounter
Spoke with Drenda Freeze, supervisor at [facility] will do another UC, straight cath and try to send out today, Informed them stop Eliquis 2 days prior per MD notePre op instructions faxed to [phone], directly to [place].Just and FYI

## 2023-07-13 ENCOUNTER — Other Ambulatory Visit: Admit: 2023-07-13 | Payer: PRIVATE HEALTH INSURANCE | Attending: Nephrology | Primary: Nephrology

## 2023-07-13 DIAGNOSIS — Z932 Ileostomy status: Secondary | ICD-10-CM

## 2023-07-13 DIAGNOSIS — D649 Anemia, unspecified: Secondary | ICD-10-CM

## 2023-07-13 DIAGNOSIS — K5780 Diverticulitis of intestine, part unspecified, with perforation and abscess without bleeding: Secondary | ICD-10-CM

## 2023-07-13 DIAGNOSIS — I82413 Acute embolism and thrombosis of femoral vein, bilateral: Secondary | ICD-10-CM

## 2023-07-13 LAB — CBC WITH AUTO DIFFERENTIAL
BKR WAM ABSOLUTE IMMATURE GRANULOCYTES.: 0.01 x 1000/ÂµL (ref 0.00–0.30)
BKR WAM ABSOLUTE LYMPHOCYTE COUNT.: 1.61 x 1000/ÂµL (ref 0.60–3.70)
BKR WAM ABSOLUTE NRBC (2 DEC): 0 x 1000/ÂµL — ABNORMAL LOW (ref 0.00–1.00)
BKR WAM ANC (ABSOLUTE NEUTROPHIL COUNT): 3.07 x 1000/ÂµL (ref 2.00–7.60)
BKR WAM BASOPHIL ABSOLUTE COUNT.: 0.1 x 1000/ÂµL (ref 0.00–1.00)
BKR WAM BASOPHILS: 1.7 % — ABNORMAL HIGH (ref 0.0–1.4)
BKR WAM EOSINOPHIL ABSOLUTE COUNT.: 0.34 x 1000/ÂµL — ABNORMAL HIGH (ref 0.00–1.00)
BKR WAM EOSINOPHILS: 5.8 % — ABNORMAL HIGH (ref 0.0–5.0)
BKR WAM HEMATOCRIT (2 DEC): 31.8 % — ABNORMAL LOW (ref 35.00–45.00)
BKR WAM HEMOGLOBIN: 9.7 g/dL — ABNORMAL LOW (ref 11.7–15.5)
BKR WAM IMMATURE GRANULOCYTES: 0.2 % (ref 0.0–1.0)
BKR WAM LYMPHOCYTES: 27.5 % (ref 17.0–50.0)
BKR WAM MCH (PG): 27 pg (ref 27.0–33.0)
BKR WAM MCHC: 30.5 g/dL — ABNORMAL LOW (ref 31.0–36.0)
BKR WAM MCV: 88.6 fL (ref 80.0–100.0)
BKR WAM MONOCYTE ABSOLUTE COUNT.: 0.72 x 1000/ÂµL — ABNORMAL LOW (ref 0.00–1.00)
BKR WAM MONOCYTES: 12.3 % — ABNORMAL HIGH (ref 4.0–12.0)
BKR WAM MPV: 10.3 fL (ref 8.0–12.0)
BKR WAM NEUTROPHILS: 52.5 % (ref 39.0–72.0)
BKR WAM NUCLEATED RED BLOOD CELLS: 0 % (ref 0.0–1.0)
BKR WAM PLATELETS: 549 x1000/ÂµL — ABNORMAL HIGH (ref 150–420)
BKR WAM RDW-CV: 15.1 % — ABNORMAL HIGH (ref 11.0–15.0)
BKR WAM RED BLOOD CELL COUNT.: 3.59 M/ÂµL — ABNORMAL LOW (ref 4.00–6.00)
BKR WAM WHITE BLOOD CELL COUNT: 5.9 x1000/ÂµL (ref 4.0–11.0)

## 2023-07-13 LAB — COMPREHENSIVE METABOLIC PANEL
BKR A/G RATIO: 0.5
BKR ALANINE AMINOTRANSFERASE (ALT): 8 U/L — ABNORMAL LOW (ref 12–78)
BKR ALBUMIN: 2.8 g/dL — ABNORMAL LOW (ref 3.4–5.0)
BKR ALKALINE PHOSPHATASE: 72 U/L — ABNORMAL HIGH (ref 20–120)
BKR ANION GAP: 5 % — ABNORMAL LOW (ref 5–18)
BKR ASPARTATE AMINOTRANSFERASE (AST): 9 U/L (ref 5–37)
BKR AST/ALT RATIO: 1.1
BKR BILIRUBIN TOTAL: 0.1 mg/dL (ref 0.0–1.0)
BKR BLOOD UREA NITROGEN: 15 mg/dL (ref 8–25)
BKR BUN / CREAT RATIO: 19.7 x1000/ÂµL — ABNORMAL HIGH (ref 8.0–25.0)
BKR CALCIUM: 10.8 mg/dL — ABNORMAL HIGH (ref 8.4–10.3)
BKR CHLORIDE: 114 mmol/L — ABNORMAL LOW (ref 95–115)
BKR CO2: 22 mmol/L — ABNORMAL LOW (ref 21–32)
BKR CREATININE: 0.76 mg/dL — ABNORMAL LOW (ref 0.50–1.30)
BKR EGFR, CREATININE (CKD-EPI 2021): >60 mL/min/{1.73_m2} (ref >=60)
BKR GLOBULIN: 5.2 g/dL
BKR GLUCOSE: 77 mg/dL (ref 70–100)
BKR OSMOLALITY CALCULATION: 281 mosm/kg (ref 275–295)
BKR POTASSIUM: 3.8 mmol/L (ref 3.5–5.1)
BKR PROTEIN TOTAL: 8 g/dL (ref 6.4–8.2)
BKR SODIUM: 141 mmol/L (ref 136–145)

## 2023-07-13 LAB — PROTIME AND INR
BKR INR: 1.12 g/dL (ref 0.88–1.14)
BKR PROTHROMBIN TIME: 11.8 seconds (ref 9.4–12.0)

## 2023-07-14 ENCOUNTER — Telehealth: Admit: 2023-07-14 | Payer: PRIVATE HEALTH INSURANCE | Attending: Urology | Primary: Nephrology

## 2023-07-14 LAB — URINE CULTURE: BKR URINE CULTURE, ROUTINE: >=100000 — AB

## 2023-07-14 NOTE — Telephone Encounter
Dr. Hulen Luster, received call stating consent form for Thursday doesn't match how patient is scheduled for OR - missing right stent removal on schedule but it is on consent. I reviewed your note from OV on 07/03/23 and did see in your note that you plan on removing the right stent since stone passed. Please confirm since I spoke with OR and had them add that portion to the procedure on the schedule. If this is incorrect, we will need to get a new consent form signed with conservator and adjust schedule with OR. Thanks!Alcario Drought, RN

## 2023-07-14 NOTE — Telephone Encounter
LM for Drenda Freeze at [facility] asking for pre-op clearance to be faxed asap.

## 2023-07-14 NOTE — Telephone Encounter
H&p pre-op clearance fax received fm Germaine Pomfret, Dr Claris Che note incomplete. No indication if pt cleared or not.  Faxed Drenda Freeze asking for note with clear indications if pt cleared or not.

## 2023-07-14 NOTE — Telephone Encounter
Updated H&P pre-op rcvd fax fm Germaine Pomfret, Dr Clent Ridges noted pt optimized for surgery.

## 2023-07-15 ENCOUNTER — Telehealth: Admit: 2023-07-15 | Payer: PRIVATE HEALTH INSURANCE | Attending: Urology | Primary: Nephrology

## 2023-07-15 NOTE — H&P
bilateral stent placement passage of right ureteral stone asymptomatic left proximal ureteral stone  HPICrystal A [name] is a 67 y.o. female  status post bilateral stent placement for urosepsisPatient had passed a right ureteral stone has not asymptomatic left ureteral stonePatient more alert todayUrine blood-tinged07/07/2023 patient looks reasonably well with a clear understanding what is going onPatient with Klebsiella and urine of presses urine blood-tinged improving\WBC down to 7.9 from 22 creatinine 0.757/11/2023 sp bilateral stent passed stone r uretr has left midOn ceftin sepsis PAR for urs on left remove stent rightUa wbc  HPI Past Medical HistoryPast Medical History    Past Medical History: Diagnosis Date  Acute embolism and thrombosis of unspecified femoral vein (HC Code)    Anemia    Cellulitis and abscess of right lower extremity    Dysphagia    Encephalopathy    GERD (gastroesophageal reflux disease)    Hypercalcemia    Paraplegia (HC Code)    Rheumatoid arthritis (HC Code)     Past Surgical HistoryPast Surgical History     Past Surgical History: Procedure Laterality Date  BAND HEMORRHOIDECTOMY      CYSTECTOMY   1972  LAPAROSCOPIC SALPINGOOPHERECTOMY   1986  LAPAROTOMY   2022  PARTIAL COLECTOMY   2022  PEG TUBE PLACEMENT   01/2022  UPPER GASTROINTESTINAL ENDOSCOPY       AllergiesNo Known [email or web address] Social HistorySocial History     Tobacco Use  Smoking status: Never  Smokeless tobacco: Never  Family History No family history on file. Review of SystemsNo symptoms beyond those noted in HPI and PMH- all other systems are negative Physical ExamBP 103/66  - Pulse 78  - Temp 99.8 ?F (37.7 ?C) (Temporal)  - Resp 20  - Ht 5' 10 (1.778 m)  - Wt 65.6 kg  - SpO2 95%  - BMI 20.75 kg/m? Cor regular rate rhythm abdomen soft nontender lungs clear     Lab Results Component Value Date   WBC 7.9 06/29/2023   HGB 9.1 (L) 06/29/2023   HCT 28.80 (L) 06/29/2023   PLT 176 06/29/2023   ALT 8 (L) 06/29/2023   AST 9 06/29/2023   NA 144 06/29/2023   K 3.2 (L) 06/29/2023   CL 116 (H) 06/29/2023   CREATININE 0.75 06/29/2023   BUN 9 06/29/2023   CO2 23 06/29/2023   TSH 0.527 03/09/2023   INR 1.08 06/28/2023   GLU 88 06/29/2023   HGBA1C 5.5 03/09/2023   No results found for: UCOLOR, UGLUCOSE, UKETONE, USPECGRAVITY, UPH, UPROTEIN, UNITRATES, UBLOOD, ULEUKOCYTES, UA  No results found for: PVRPOCT     Previous resultsNo results found for: TESTOSTERONE  AUA Symptom Score today:       No data to display        PATHOLOGY AND BIOPSIES   TEST RESULTS   Images     DISCUSSION       FL OR Urography Retrograde Result Date: 06/27/2023 Intraoperative fluoroscopy was utilized for bilateral retrograde injections and bilateral ureteral stent placements. Reported and signed by:  Danielle Clines, MD  [place] Abdomen Pelvis wo IV Contrast (No Oral)  (Renal Stone) Result Date: 06/26/2023 1. Moderate obstruction of the right kidney from a 6, right UVJ calculus. 2. Nonobstructing renal calculi. 3. Gallstones with trace pericholecystic fluid could be secondary to the adjacent inflammatory change. Cholecystitis cannot be entirely excluded. 4. Postoperative changes with extensive colonic diverticulosis. Reported and signed by:  Danielle Clines, MD  XR Chest PA or AP Result Date: 7/5/2024Limited AP view- No  acute abnormality. [facility] Radiology Notify System Classification: Routine. Reported and signed by:  Danielle [name]  US Duplex Lower Extremity Venous Bilat Result Date: 06/19/2023 No DVT of the right or left lower extremity. [name] Radiology Notify System Classification: Routine. Reported and signed by:  Danielle Chimera, MD  [place] Abdomen Pelvis w IV Contrast (No Oral) Result Date: 05/24/2023  1. Extensive colonic diverticulosis without Silex evidence for diverticulitis. No definite contrast extravasation is seen to identify a source of bleeding. 2. Nonobstructing right renal calculi. Delayed left nephrogram with a new 5 mm calculus projecting at the level of the left iliac crest highly suspicious for a left ureteral calculus Dr. Alena [name] discussed these findings with Danielle Constant, PA at 2:56 AM. I discussed these findings with Danielle [name] at 9:30 AM. Radiology Notify System Classification: Clinical team aware. Individual dose optimization technique was used for the performance of this procedure, and one or more of the following dose reduction techniques was used: Automated exposure control and/or adjustment of the MA and/or kV according to patient size and/or the use of iterative reconstruction technique. Reported and signed by:  Danielle Baseman, MD  CXR portable Result Date: 03/09/2023  No active cardiopulmonary abnormalities.  No change. [facility] [employer] [name]: Routine. Reported and signed by: Danielle Critchley, MD    Assessment & Plan: Danielle [name] is a 67 y.o. bilateral stent  Passed stone on right has stent Left mid urterUreteroscopy  Orders Placed This Encounter Procedures  Blood culture  SARS-CoV-2 (COVID-19)/Influenza A+B/RSV by RT-PCR ([facility])  Urine culture  Urine culture  Anaerobic Blood Culture Identification PCR (Lab Orderable Only)([facility])  XR Chest PA or AP  [place] Abdomen Pelvis wo IV Contrast (No Oral)  (Renal Stone)  FL OR Urography Retrograde  XR Abdomen AP  Lactic acid, plasma (reflex 2h repeat)  Protime and INR  Urinalysis with culture reflex (obtain straight cath if patient unable to provide sample)  CBC auto differential  Comprehensive metabolic panel  UA reflex to culture  Urinalysis with culture reflex     ([facility])  Urine microscopic     ([facility]) Magnesium  CBC auto differential  Procalcitonin     ([facility] Q [facility])  C-reactive protein (CRP)  Stone analysis  CBC No diff  Partial thromboplastin time  Partial thromboplastin time  Cortisol  CBC No differential  Partial thromboplastin time  Lactic acid, plasma (reflex 2h repeat)  Partial thromboplastin time  Comprehensive metabolic panel  C-reactive protein (CRP)  Hemoglobin and hematocrit, blood  Partial thromboplastin time     ([facility] Q [facility])  Partial thromboplastin time  Comprehensive metabolic panel  Partial thromboplastin time  PT/INR and PTT  Partial thromboplastin time  Comprehensive metabolic panel  CBC No differential  Diet Cardiac  Sepsis Alert  Strain all urine  Discontinue PACU Orders  Remove Urinary Catheter  Full Code  PT Eval and Treat  Incentive Spirometry  CARDIAC EKG RESULT SCAN  CARDIAC MISC.  RESULT SCAN  CARDIAC MISC.  RESULT SCAN  CARDIAC MISC.  RESULT SCAN  CARDIAC MISC.  RESULT SCAN  CARDIAC MISC.  RESULT SCAN  EKG  Type and screen  ED Admit to Inpatient  Transfer patient  Transfer patient  Transfer patient  Transfer patient  Transfer patient  CBC and differential  Comprehensive metabolic panel  CBC and Differential  Comprehensive metabolic panel  Place in ED Observation

## 2023-07-15 NOTE — Telephone Encounter
Copied from CRM #[id number]. Topic: General Message - [facility]>> Jul 15, 2023  3:04 PM Luciano Cutter wrote:[facility] CENTER MESSAGETime of call:   3:04 PMCaller:  Drenda Freeze from Iraan WitherellCaller's relationship to patient:  Nursing HomeCalling from (pharmacy, hospital, agency, etc.):  HomeReason for call:   Pt has surgery tomorrow and nursing home is not sure they can get pt there by 7 am tomorrow. They might have transportation issue. Please call Wendall Papa telephone number for callback:   [phone] time to return call:   ASAPPermission to leave message:  yes   [facility]

## 2023-07-15 NOTE — Telephone Encounter
Spoke to Dr Hulen Luster, he cnfmd to move pt to 2nd case of the day. I spoke to [place] and cnfmd pt to now arrive at [facility] at 7.30am for surgery,ok per Clydie Braun at [facility] OR.

## 2023-07-15 NOTE — Telephone Encounter
Spoke to [place] at nursing home, I have to check with Dr Hulen Luster before I can confirm anything.  Text sent to Dr Hulen Luster, waiting for response. He is in clinic all day.

## 2023-07-16 ENCOUNTER — Ambulatory Visit: Admit: 2023-07-16 | Payer: MEDICARE | Attending: Anesthesiology | Primary: Nephrology

## 2023-07-16 ENCOUNTER — Encounter: Admit: 2023-07-16 | Payer: PRIVATE HEALTH INSURANCE | Attending: Urology | Primary: Nephrology

## 2023-07-16 ENCOUNTER — Ambulatory Visit: Admit: 2023-07-16 | Payer: MEDICARE | Primary: Nephrology

## 2023-07-16 ENCOUNTER — Inpatient Hospital Stay: Admit: 2023-07-16 | Discharge: 2023-07-17 | Payer: MEDICARE | Attending: Urology | Primary: Nephrology

## 2023-07-16 ENCOUNTER — Telehealth: Admit: 2023-07-16 | Payer: PRIVATE HEALTH INSURANCE | Attending: Urology | Primary: Nephrology

## 2023-07-16 DIAGNOSIS — K219 Gastro-esophageal reflux disease without esophagitis: Secondary | ICD-10-CM

## 2023-07-16 DIAGNOSIS — Z932 Ileostomy status: Secondary | ICD-10-CM

## 2023-07-16 DIAGNOSIS — G8220 Paraplegia, unspecified: Secondary | ICD-10-CM

## 2023-07-16 DIAGNOSIS — L03115 Cellulitis of right lower limb: Secondary | ICD-10-CM

## 2023-07-16 DIAGNOSIS — M069 Rheumatoid arthritis, unspecified: Secondary | ICD-10-CM

## 2023-07-16 DIAGNOSIS — I82419 Acute embolism and thrombosis of unspecified femoral vein: Secondary | ICD-10-CM

## 2023-07-16 DIAGNOSIS — D649 Anemia, unspecified: Secondary | ICD-10-CM

## 2023-07-16 DIAGNOSIS — N201 Calculus of ureter: Secondary | ICD-10-CM

## 2023-07-16 DIAGNOSIS — N200 Calculus of kidney: Secondary | ICD-10-CM

## 2023-07-16 DIAGNOSIS — E099 Drug or chemical induced diabetes mellitus without complications: Secondary | ICD-10-CM

## 2023-07-16 DIAGNOSIS — R627 Adult failure to thrive: Secondary | ICD-10-CM

## 2023-07-16 DIAGNOSIS — R32 Unspecified urinary incontinence: Secondary | ICD-10-CM

## 2023-07-16 DIAGNOSIS — K5792 Diverticulitis of intestine, part unspecified, without perforation or abscess without bleeding: Secondary | ICD-10-CM

## 2023-07-16 DIAGNOSIS — G9340 Encephalopathy, unspecified: Secondary | ICD-10-CM

## 2023-07-16 DIAGNOSIS — R1310 Dysphagia, unspecified: Secondary | ICD-10-CM

## 2023-07-16 DIAGNOSIS — A499 Bacterial infection, unspecified: Secondary | ICD-10-CM

## 2023-07-16 LAB — CBC WITH AUTO DIFFERENTIAL
BKR WAM ABSOLUTE IMMATURE GRANULOCYTES.: 0.01 x 1000/ÂµL (ref 0.00–0.30)
BKR WAM ABSOLUTE LYMPHOCYTE COUNT.: 1.36 x 1000/ÂµL (ref 0.60–3.70)
BKR WAM ABSOLUTE NRBC (2 DEC): 0 x 1000/ÂµL (ref 0.00–1.00)
BKR WAM ANC (ABSOLUTE NEUTROPHIL COUNT): 3.12 x 1000/ÂµL (ref 2.00–7.60)
BKR WAM BASOPHIL ABSOLUTE COUNT.: 0.08 x 1000/ÂµL (ref 0.00–1.00)
BKR WAM BASOPHILS: 1.5 % — ABNORMAL HIGH (ref 0.0–1.4)
BKR WAM EOSINOPHIL ABSOLUTE COUNT.: 0.33 x 1000/ÂµL (ref 0.00–1.00)
BKR WAM EOSINOPHILS: 6 % — ABNORMAL HIGH (ref 0.0–5.0)
BKR WAM HEMATOCRIT (2 DEC): 35 % (ref 35.00–45.00)
BKR WAM HEMOGLOBIN: 10.7 g/dL — ABNORMAL LOW (ref 11.7–15.5)
BKR WAM IMMATURE GRANULOCYTES: 0.2 % (ref 0.0–1.0)
BKR WAM LYMPHOCYTES: 24.8 % (ref 17.0–50.0)
BKR WAM MCH (PG): 27 pg (ref 27.0–33.0)
BKR WAM MCHC: 30.6 g/dL — ABNORMAL LOW (ref 31.0–36.0)
BKR WAM MCV: 88.4 fL (ref 80.0–100.0)
BKR WAM MONOCYTE ABSOLUTE COUNT.: 0.58 x 1000/ÂµL (ref 0.00–1.00)
BKR WAM MONOCYTES: 10.6 % (ref 4.0–12.0)
BKR WAM MPV: 10 fL (ref 8.0–12.0)
BKR WAM NEUTROPHILS: 56.9 % (ref 39.0–72.0)
BKR WAM NUCLEATED RED BLOOD CELLS: 0 % (ref 0.0–1.0)
BKR WAM PLATELETS: 417 x1000/ÂµL (ref 150–420)
BKR WAM RDW-CV: 15.2 % — ABNORMAL HIGH (ref 11.0–15.0)
BKR WAM RED BLOOD CELL COUNT.: 3.96 M/ÂµL — ABNORMAL LOW (ref 4.00–6.00)
BKR WAM WHITE BLOOD CELL COUNT: 5.5 x1000/ÂµL (ref 4.0–11.0)

## 2023-07-16 LAB — BASIC METABOLIC PANEL
BKR ANION GAP: 7 (ref 5–18)
BKR BLOOD UREA NITROGEN: 13 mg/dL (ref 8–25)
BKR BUN / CREAT RATIO: 15.9 (ref 8.0–25.0)
BKR CALCIUM: 10.5 mg/dL — ABNORMAL HIGH (ref 8.4–10.3)
BKR CHLORIDE: 115 mmol/L (ref 95–115)
BKR CO2: 19 mmol/L — ABNORMAL LOW (ref 21–32)
BKR CREATININE: 0.82 mg/dL (ref 0.50–1.30)
BKR EGFR, CREATININE (CKD-EPI 2021): >60 mL/min/{1.73_m2} (ref >=60)
BKR GLUCOSE: 81 mg/dL (ref 70–100)
BKR OSMOLALITY CALCULATION: 280 mosm/kg (ref 275–295)
BKR POTASSIUM: 4.2 mmol/L (ref 3.5–5.1)
BKR SODIUM: 141 mmol/L (ref 136–145)

## 2023-07-16 LAB — C-REACTIVE PROTEIN     (CRP): BKR C-REACTIVE PROTEIN: 0.8 mg/dL (ref 0.0–1.0)

## 2023-07-16 MED ORDER — SURGICAL LUBRICANT JELLY TOPICAL
Status: DC | PRN
Start: 2023-07-16 — End: 2023-07-16
  Administered 2023-07-16: 13:00:00 via TOPICAL

## 2023-07-16 MED ORDER — ZZ IMS TEMPLATE
Freq: Two times a day (BID) | ORAL | Status: DC
Start: 2023-07-16 — End: 2023-07-18
  Administered 2023-07-16 – 2023-07-17 (×2): 400 mg via ORAL

## 2023-07-16 MED ORDER — HYDROMORPHONE 0.5 MG/0.5 ML INJECTION SYRINGE
0.5 | INTRAVENOUS | Status: DC | PRN
Start: 2023-07-16 — End: 2023-07-16

## 2023-07-16 MED ORDER — LACTATED RINGERS INTRAVENOUS SOLUTION
INTRAVENOUS | Status: DC
Start: 2023-07-16 — End: 2023-07-18
  Administered 2023-07-16 – 2023-07-17 (×2): 1000.000 mL/h via INTRAVENOUS

## 2023-07-16 MED ORDER — ONDANSETRON HCL (PF) 4 MG/2 ML INJECTION SOLUTION
4 | Freq: Four times a day (QID) | INTRAVENOUS | Status: DC | PRN
Start: 2023-07-16 — End: 2023-07-18

## 2023-07-16 MED ORDER — ONDANSETRON HCL (PF) 4 MG/2 ML INJECTION SOLUTION
4 | Status: CP
Start: 2023-07-16 — End: ?

## 2023-07-16 MED ORDER — FENTANYL (PF) 50 MCG/ML INJECTION SOLUTION
50 | INTRAVENOUS | Status: DC | PRN
Start: 2023-07-16 — End: 2023-07-16
  Administered 2023-07-16 (×2): 50 mcg/mL via INTRAVENOUS

## 2023-07-16 MED ORDER — BISACODYL 10 MG RECTAL SUPPOSITORY
10 | Freq: Every day | RECTAL | Status: DC | PRN
Start: 2023-07-16 — End: 2023-07-18

## 2023-07-16 MED ORDER — HEPARIN (PORCINE) 10,000 UNIT/ML INJECTION SOLUTION
10000 | Status: CP
Start: 2023-07-16 — End: ?

## 2023-07-16 MED ORDER — IOHEXOL 240 MG IODINE/ML INTRAVENOUS SOLUTION
240 | Status: CP
Start: 2023-07-16 — End: ?

## 2023-07-16 MED ORDER — NALOXONE 0.4 MG/ML INJECTION SOLUTION
0.4 | INTRAVENOUS | Status: DC | PRN
Start: 2023-07-16 — End: 2023-07-16

## 2023-07-16 MED ORDER — FOLIC ACID 1 MG TABLET
1 | Freq: Every day | ORAL | Status: DC
Start: 2023-07-16 — End: 2023-07-18
  Administered 2023-07-17: 13:00:00 1 mg via ORAL

## 2023-07-16 MED ORDER — SODIUM CHLORIDE 0.9 % IRRIGATION SOLUTION
0.9 | Status: CP | PRN
Start: 2023-07-16 — End: ?
  Administered 2023-07-16: 13:00:00 0.9 % irrigation

## 2023-07-16 MED ORDER — WATER FOR IRRIGATION, STERILE SOLUTION
Status: DC | PRN
Start: 2023-07-16 — End: 2023-07-16
  Administered 2023-07-16: 13:00:00

## 2023-07-16 MED ORDER — ROCURONIUM 10 MG/ML INTRAVENOUS SOLUTION
10 | INTRAVENOUS | Status: DC | PRN
Start: 2023-07-16 — End: 2023-07-16
  Administered 2023-07-16: 13:00:00 10 mg/mL via INTRAVENOUS

## 2023-07-16 MED ORDER — GABAPENTIN 600 MG TABLET
600 | Freq: Every evening | ORAL | Status: DC
Start: 2023-07-16 — End: 2023-07-18
  Administered 2023-07-17: 03:00:00 600 mg via ORAL

## 2023-07-16 MED ORDER — MIDAZOLAM 1 MG/ML INJECTION SOLUTION
1 | Status: CP
Start: 2023-07-16 — End: ?

## 2023-07-16 MED ORDER — TRAMADOL 50 MG TABLET
50 | Freq: Four times a day (QID) | ORAL | Status: DC | PRN
Start: 2023-07-16 — End: 2023-07-18
  Administered 2023-07-17: 18:00:00 50 mg via ORAL

## 2023-07-16 MED ORDER — LACTATED RINGERS INTRAVENOUS SOLUTION
INTRAVENOUS | Status: DC | PRN
Start: 2023-07-16 — End: 2023-07-16
  Administered 2023-07-16: 13:00:00 via INTRAVENOUS

## 2023-07-16 MED ORDER — DEXAMETHASONE SODIUM PHOSPHATE (PF) 10 MG/ML INJECTION SOLUTION
10 | Freq: Once | INTRAVENOUS | Status: DC | PRN
Start: 2023-07-16 — End: 2023-07-16

## 2023-07-16 MED ORDER — HEPARIN (PORCINE) 5,000 UNIT/ML INJECTION SOLUTION
5000 | Freq: Three times a day (TID) | SUBCUTANEOUS | Status: DC
Start: 2023-07-16 — End: 2023-07-18
  Administered 2023-07-17 (×3): via SUBCUTANEOUS

## 2023-07-16 MED ORDER — PROPOFOL 10 MG/ML INTRAVENOUS EMULSION
10 | Status: CP
Start: 2023-07-16 — End: ?

## 2023-07-16 MED ORDER — PROCHLORPERAZINE MALEATE 5 MG TABLET
5 | Freq: Four times a day (QID) | ORAL | Status: DC | PRN
Start: 2023-07-16 — End: 2023-07-18

## 2023-07-16 MED ORDER — SODIUM CHLORIDE 0.9 % (FLUSH) INJECTION SYRINGE
0.9 | Freq: Three times a day (TID) | INTRAVENOUS | Status: DC
Start: 2023-07-16 — End: 2023-07-16

## 2023-07-16 MED ORDER — SODIUM CHLORIDE 0.9 % (FLUSH) INJECTION SYRINGE
0.9 | INTRAVENOUS | Status: DC | PRN
Start: 2023-07-16 — End: 2023-07-16

## 2023-07-16 MED ORDER — SUGAMMADEX 100 MG/ML INTRAVENOUS SOLUTION
100 | Status: CP
Start: 2023-07-16 — End: ?

## 2023-07-16 MED ORDER — SUGAMMADEX 100 MG/ML INTRAVENOUS SOLUTION
100 | INTRAVENOUS | Status: DC | PRN
Start: 2023-07-16 — End: 2023-07-16
  Administered 2023-07-16: 14:00:00 100 mg/mL via INTRAVENOUS

## 2023-07-16 MED ORDER — SODIUM CHLORIDE 0.9 % INTRAVENOUS PIGGYBACK
Status: CP
Start: 2023-07-16 — End: ?

## 2023-07-16 MED ORDER — BISACODYL 5 MG TABLET,DELAYED RELEASE
5 | Freq: Every day | ORAL | Status: DC | PRN
Start: 2023-07-16 — End: 2023-07-18

## 2023-07-16 MED ORDER — ACETAMINOPHEN 500 MG TABLET
500 | Freq: Four times a day (QID) | ORAL | Status: DC
Start: 2023-07-16 — End: 2023-07-16

## 2023-07-16 MED ORDER — ONDANSETRON 4 MG DISINTEGRATING TABLET
4 | Freq: Four times a day (QID) | ORAL | Status: DC | PRN
Start: 2023-07-16 — End: 2023-07-18

## 2023-07-16 MED ORDER — MAGNESIUM HYDROXIDE 400 MG/5 ML ORAL SUSPENSION
400 | Freq: Every day | ORAL | Status: DC | PRN
Start: 2023-07-16 — End: 2023-07-18

## 2023-07-16 MED ORDER — TRAMADOL 50 MG TABLET
50 mg | Freq: Four times a day (QID) | ORAL | Status: DC | PRN
Start: 2023-07-16 — End: 2023-07-16
  Administered 2023-07-16: 23:00:00 50 mg via ORAL

## 2023-07-16 MED ORDER — ROSUVASTATIN 10 MG TABLET
10 | Freq: Every evening | GASTROSTOMY | Status: DC
Start: 2023-07-16 — End: 2023-07-18
  Administered 2023-07-17: 02:00:00 10 mg via GASTROSTOMY

## 2023-07-16 MED ORDER — TRAMADOL 50 MG TABLET
50 mg | Freq: Four times a day (QID) | ORAL | Status: DC | PRN
Start: 2023-07-16 — End: 2023-07-16
  Administered 2023-07-16: 18:00:00 50 mg via ORAL

## 2023-07-16 MED ORDER — POLYETHYLENE GLYCOL 3350 17 GRAM ORAL POWDER PACKET
17 | Freq: Every day | ORAL | Status: DC
Start: 2023-07-16 — End: 2023-07-18
  Administered 2023-07-17 (×2): 17 gram via ORAL

## 2023-07-16 MED ORDER — IOHEXOL 240 MG IODINE/ML INTRAVENOUS SOLUTION
240 | Status: DC | PRN
Start: 2023-07-16 — End: 2023-07-16
  Administered 2023-07-16: 14:00:00 240 mg iodine/mL

## 2023-07-16 MED ORDER — OXYCODONE IMMEDIATE RELEASE 5 MG TABLET
5 | ORAL | Status: DC | PRN
Start: 2023-07-16 — End: 2023-07-16

## 2023-07-16 MED ORDER — OXYCODONE IMMEDIATE RELEASE 10 MG TABLET
10 | ORAL | Status: DC | PRN
Start: 2023-07-16 — End: 2023-07-16

## 2023-07-16 MED ORDER — PROPOFOL 10 MG/ML INTRAVENOUS EMULSION
10 | INTRAVENOUS | Status: DC | PRN
Start: 2023-07-16 — End: 2023-07-16
  Administered 2023-07-16: 13:00:00 10 mg/mL via INTRAVENOUS

## 2023-07-16 MED ORDER — HYDROXYCHLOROQUINE 200 MG TABLET
200 | Freq: Every day | ORAL | Status: DC
Start: 2023-07-16 — End: 2023-07-18
  Administered 2023-07-17: 13:00:00 200 mg via ORAL

## 2023-07-16 MED ORDER — ERTAPENEM 1 GRAM SOLUTION FOR INJECTION
1 | Freq: Once | INTRAMUSCULAR | Status: CP
Start: 2023-07-16 — End: ?
  Administered 2023-07-16: 13:00:00 1 mL via INTRAMUSCULAR

## 2023-07-16 MED ORDER — ACETAMINOPHEN 325 MG TABLET
325 | Freq: Four times a day (QID) | ORAL | Status: DC
Start: 2023-07-16 — End: 2023-07-18
  Administered 2023-07-16: 15:00:00 325 mg via ORAL

## 2023-07-16 MED ORDER — MELATONIN 3 MG TABLET
3 | Freq: Every evening | ORAL | Status: DC
Start: 2023-07-16 — End: 2023-07-18

## 2023-07-16 MED ORDER — PROCHLORPERAZINE EDISYLATE 10 MG/2 ML (5 MG/ML) INJECTION SOLUTION
10 | Freq: Four times a day (QID) | INTRAVENOUS | Status: DC | PRN
Start: 2023-07-16 — End: 2023-07-18

## 2023-07-16 MED ORDER — TRAMADOL 50 MG TABLET
50 | Freq: Four times a day (QID) | ORAL | Status: DC | PRN
Start: 2023-07-16 — End: 2023-07-18

## 2023-07-16 MED ORDER — OXYCODONE (ROXICODONE) IMMEDIATE RELEASE 2.5 MG HALFTAB
2.5 | ORAL | Status: DC | PRN
Start: 2023-07-16 — End: 2023-07-16

## 2023-07-16 MED ORDER — FAMOTIDINE 20 MG TABLET
20 | Freq: Two times a day (BID) | ORAL | Status: DC
Start: 2023-07-16 — End: 2023-07-18
  Administered 2023-07-17 (×2): 20 mg via ORAL

## 2023-07-16 MED ORDER — ONDANSETRON HCL (PF) 4 MG/2 ML INJECTION SOLUTION
4 | INTRAVENOUS | Status: DC | PRN
Start: 2023-07-16 — End: 2023-07-16

## 2023-07-16 MED ORDER — ERTAPENEM 1000 MG MBP
INTRAVENOUS | Status: DC
Start: 2023-07-16 — End: 2023-07-18
  Administered 2023-07-17: 13:00:00 50.000 mL/h via INTRAVENOUS

## 2023-07-16 MED ORDER — OXYCODONE IMMEDIATE RELEASE 5 MG TABLET
5 | ORAL | Status: DC | PRN
Start: 2023-07-16 — End: 2023-07-18

## 2023-07-16 MED ORDER — FENTANYL (PF) 50 MCG/ML INJECTION SOLUTION
50 | Status: CP
Start: 2023-07-16 — End: ?

## 2023-07-16 MED ORDER — FERROUS GLUCONATE 324 MG (38 MG IRON) TABLET
324 | ORAL | Status: DC
Start: 2023-07-16 — End: 2023-07-18
  Administered 2023-07-17: 02:00:00 324 mg (38 mg iron) via ORAL

## 2023-07-16 MED ORDER — SENNOSIDES 8.6 MG TABLET
8.6 | Freq: Every evening | ORAL | Status: DC
Start: 2023-07-16 — End: 2023-07-18
  Administered 2023-07-17: 02:00:00 8.6 mg via ORAL

## 2023-07-16 MED ORDER — LACTATED RINGERS INTRAVENOUS SOLUTION
INTRAVENOUS | Status: DC
Start: 2023-07-16 — End: 2023-07-16

## 2023-07-16 NOTE — Plan of Care
Plan of Care Overview/ Patient Status    1145 Received pt from PACU. Pt is A&Ox4. Room air. Pt denies sob or chest pain. Incentive spirometer provided bedside, initial education provided encouraged use 10x hourly while awake. Abdomen soft nontender. Tolerating diet denies n/v. Foley in place taped to RLE, patent yellow urine. Ileostomy to RLQ bag is empty. IVF infusing. SCDS b/l.  Patient and family oriented to room unit , call bell within use, safety precautions including fall prevention education.Patient/Family acknowledge understanding of fall prevention education including to call nurse with assistance with ambulation.Labeled wheelchair bedside, glasses in place , sister bedsideReport provided to RN MarisaProblem: Fall Injury RiskGoal: Absence of Fall and Fall-Related InjuryOutcome: Interventions implemented as appropriate Problem: Pain AcuteGoal: Optimal Pain Control and FunctionOutcome: Interventions implemented as appropriate Problem: Adult Inpatient Plan of CareGoal: Plan of Care ReviewOutcome: Interventions implemented as appropriateGoal: Patient-Specific Goal (Individualized)Outcome: Interventions implemented as appropriateGoal: Absence of Hospital-Acquired Illness or InjuryOutcome: Interventions implemented as appropriateGoal: Optimal Comfort and WellbeingOutcome: Interventions implemented as appropriateGoal: Readiness for Transition of CareOutcome: Interventions implemented as appropriate

## 2023-07-16 NOTE — Interval H&P Note
Subsequent to admission for surgery or invasive procedure, I have reassessed the patient by examination and review of relevant data pertaining to the planned procedure.

## 2023-07-16 NOTE — Other
0830 pt brought in w/c from [facility]; alert, forgetful at times w/sister; Patient/Family acknowledge understanding of fall prevention education including to call nurse with assistance with ambulation

## 2023-07-16 NOTE — Anesthesia Post-Procedure Evaluation
Anesthesia Post-op NotePatient: Danielle A MaloneProcedure(s):  Procedure(s) (LRB):CYSTOSCOPY LEFT  STENT REMOVAL AND REPLACEMENT (Left)REMOVAL, RIGHT URETERAL STENT (Right) Last Vitals:  I have reviewed the post-operative vital signs as noted in the Epic chart.POSTOP EVALUATION:      Patient Recovery Location:  PACU     Vital Signs Status:  Stable     Patient Participation:  Patient participated     Mental Status:  Awake     Respiratory Status:  Acceptable     Airway Patency:  Patent     Cardiovascular/Hydration Status:  Stable     Pain Management:  Satisfactory to patient     Nausea/Vomiting Status:  Satisfactory to patientThere were no known notable events for this encounter.

## 2023-07-16 NOTE — Utilization Review (ED)
UM Status: Meets Outpatient Status Procedure(s):  Procedure(s) (LRB):CYSTOSCOPY LEFT  STENT REMOVAL AND REPLACEMENT (Left)REMOVAL, RIGHT URETERAL STENT (Right)

## 2023-07-16 NOTE — Other
Operative Diagnosis:Pre-op:   Renal calculus [N20.0] Patient Coded Diagnosis   Pre-op diagnosis: Renal calculus  Post-op diagnosis: Renal calculus  Patient Diagnosis   Pre-op diagnosis: Renal calculus [N20.0]  Post-op diagnosis:     Post-op diagnosis:   * Renal calculus [N20.0]Operative Procedure(s) :Procedure(s) (LRB):CYSTOSCOPY LEFT  STENT REMOVAL AND REPLACEMENT (Left)REMOVAL, RIGHT URETERAL STENT (Right)Post-op Procedure & Diagnosis ConfirmationPost-op Diagnosis: Post-op Diagnosis confirmed (no changes)Post-op Procedure: Post-op Procedure confirmed (no changes)

## 2023-07-16 NOTE — Plan of Care
Plan of Care Overview/ Patient Status    Received patient in bed awake alert oriented denies pain foley in place draining clear yellow,pink tinged urine ileostomy noted to rlq of abdomen -bag emptyAbdomen soft non tender Ivf infusing Sister at bedsideWheelchair at bedside Patient/Family acknowledge understanding of fall prevention education including to call nurse with assistance with ambulation and video monitoring NoOriented to room call bell in reach understands use Refused scds- complaining of causing pain to her legsTramadol given for general pain Tolerating poMepilex dressing in place to left lower quadrant for wound pta Rich Reining

## 2023-07-16 NOTE — Discharge Instructions
Ureteral StentA ureteral stent is a temporary, thin, soft, plastic tube that helps maintain the flow of urine through the ureter.  Stents are placed in the operating room to unblock the ureter and are routinely used after ureteroscopy.  The stent is completely inside (you cannot see it) with one end sitting in the kidney and the other in the bladder.  All stents must be removed before 3-4 months otherwise you will risk infections, stones, or kidney damage. What to Expect at [facility] a stent is placed, you may feel a pulling sensation when you urinate, a spasm at the end of urination or the urge to urinate more often.  It is normal to have some blood in your urine as long as you have the stent. It can cause urinary frequency (feeling like you have to urinate often) or urgency (feeling like you need to run to the bathroom), burning when urinating, pain, or blood in the urine (which can be intermittent). If you hold your urine too long, you may notice a dull ache in your back.  Be sure to drink plenty of fluids and use the bathroom frequently. If you are experiencing moderate stent discomfort, place a warm compress or hot water bottle on you back or belly where you ache. A hot bath or shower may also offer relief. Ibuprofen (400mg  every 6 hours, with food) or your prescription pain medication can be used as needed.   Stent RemovalHow long you will require your stent depends on the reason it was placed. We will tell you when your stent must be removed or exchanged after surgery (check your discharge instruction). The stent is soft and generally comes out easily.If there is a string attached:The urology team will tell you when if and when you are to remove the stent. If you are removing the stent yourself, a string may be tied to the stent with the other end taped to your skin.  To remove the stent, peel off the tape and steadily pull the string (count as you pull - it should be out by 3). It is best to do this in the shower since the stent will be a little wet when it comes out.  Take ibuprofen or tylenol (as long as you have no contraindication) and prescription pain medication 30 minutes prior to removing the stent since you can feel a spasm after the stent is removed. If there is no string: If the stent is in for longer (you will not see a string), we will remove it in the office using a small, flexible camera (cystoscope).  When to Call Your Doctor Fever over 101o FSevere pain uncontrolled by medicationNausea and vomitingBleeding with clots in urineInability to pass urineIf you have any questions or concerns Monitor your urine output regularly. Call MD or go the ER if:1) You notice that the output is dark red (bloody) and/or it has clots.2) You develop bladder discomfort due to retention of urine3) You develop abdominal pain, fevers, chills, dizziness, chest pain or shortness of breath. bacteria up into the urethra (tube in your body that normally drains the bladder) and cause infection.Place a small amount of sterile lubricant at the tip of the penis where the catheter is entering.Taking Care of the Drainage BagsTwo drainage bags will be taken home: a large overnight drainage bag, and a smaller leg bag which fits underneath clothing.It is okay to wear the overnight bag at any time, but NEVER wear the smaller leg bag at night.Keep the drainage bag well  below the level of your bladder. This prevents backflow of urine into the bladder and allows the urine to drain freely.Anchor the tubing to your leg to prevent pulling or tension on the catheter. Use tape or a leg strap provided by the hospital.Empty the drainage bag when it is 1/2 to 3/4 full. Wash your hands before and after touching the bag.Periodically check the tubing for kinks to make sure there is no pressure on the tubing which could restrict the flow of urine.Changing the Drainage BagsCleanse both ends of the clean bag with alcohol before changing.Pinch off the rubber catheter to avoid urine spillage during the disconnection.Disconnect the dirty bag and connect the clean one.Empty the dirty bag carefully to avoid a urine spill.Attach the new bag to the leg with tape or a leg strap.Cleaning the Drainage BagsWhenever a drainage bag is disconnected, it must be cleaned quickly so it is ready for the next use.Wash the bag in warm, soapy water.Rinse the bag thoroughly with warm water.Soak the bag for 30 minutes in a solution of white vinegar and water (1 cup vinegar to 1 quart warm water).Rinse with warm water.

## 2023-07-16 NOTE — Other
[facility] : [facility]		Infectious Disease Consult Note Patient Data:  Patient Name: Danielle [name] Age: 67 y.o. DOB: September 08, 1956	 MRN: [id number]	 Consult Information: Consultation requested by: [name], MDReason for consultation:  Antibiotic recommendations in the setting of recent urosepsis and new ureteral injurySource of Information: Patient, Family, Doctor, and EMR/Previous RecordPresentation History: HPI: Pt is a 67 yo F w/ PMH of rheumatoid arthritis on hydroxychloroquine, DVT on apixaban, perforated diverticulitis s/p right hemicolectomy/ileostomy c/b abdominal wall abscess (2023), IDA, and recent hospitalization for urosepsis 2/2 obstructing stone s/p bilateral ureteral stents (7/5) who was admitted on 07/25 after sustaining ureteral injury during procedure earlier today.Patient has been hospitalized 3 different times since 02/2023. First admission was to [facility] (3/18-3/21) for encephalopathy and septic shock that required stay in ICU for pressor support.  Found to have ESBL E coli bacteremia and UTI.  Discharge to SNF where she finished 14 days of ertapenem.  Of note, previously on prednisone for RA but was discontinued on discharge.  Second admission to [facility] (6/1-6/4) for hematuria and blood in ileostomy bag.  Found to have a stone in the left ureter.  Conservative management per Urology.  No evidence of urinary infection at that time and was not discharged on antibiotics.  Most recent admission to [facility] (7/5-7/10) for septic shock secondary to an obstructing right ureteral stone.  Underwent bilateral ureteral stent placement on 07/05.  Required stay in ICU for pressor support.  Blood and urine cultures grew susceptible Klebsiella pneumoniae.  Treated with ceftriaxone followed by oral cefuroxime to complete a 10 day total course (end date 7/14).Follow up with Dr. Hulen Luster as an outpatient.  Subsequent imaging showed she passed the stone on the right side. Required urologic intervention for management of stone on the left.  Preop urine culture grew ESBL E coli.  Started taking Bactrim DS on 07/22.  Presented to [facility] on 07/25 for cystoscopy with right stent removal and replacement of left ureteral stent.  Received perioperative dose of ertapenem.  Surgery complicated by left ureter perforation.  Per discussion with Dr. Hulen Luster, unable to remove left-sided stone and she sustained a small ureteral perforation.  Subsequently admitted to [facility] after procedure for IV antibiotics and monitoring.  ID consulted for antibiotic recommendations.  Saw patient this morning after she was transferred to the floors.  Friend at bedside.  Doing well overall.  Although reports new left flank/lower back burning pain.  Also having left leg pain, which he thinks may be due to her rheumatoid arthritis.  Denies urinary symptoms.  No fever or chills prior to admissionD4 total abx: D1 ertapenem after 3 days of oral Bactrim (outpatient)Afebrile CRP-0.8No leukocytosis Micro:Blood cultures (3/18):  ESBL E coliUrine culture (3/18):  ESBL E coliBlood cultures (7/5):  Klebsiella pneumoniaeUrine culture (7/5):  Klebsiella pneumoniaeUrine culture (7/19):  ESBL E coliOR urine culture (7/25-peri abx):  PendingReview of Allergies/Medical History/Medications: I have reviewed the patient's allergies, prior to admission meds, past medical/surgical, family and social hx. 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 She has never used smokeless tobacco. She reports that she does not currently use alcohol. She reports that she does not use drugs.Prior to Admission Medications Medications Prior to Admission Medication Sig Dispense Refill Last Dose  apixaban (ELIQUIS) 5 mg tablet 1 tablet (5 mg total) by Per G Tube route every 12 (twelve) hours.   07/10/2023 at 0900  cefuroxime (CEFTIN) 500 mg tablet Take 1 tablet (500 mg total) by mouth 2 (two) times daily.   07/13/2023  ferrous gluconate (FERGON) 324 mg (38 mg iron) tablet Take 1 tablet (324 mg total) by mouth every other day.   07/10/2023  folic acid (FOLVITE) 1 mg tablet Take 1 tablet (1 mg total) by mouth daily. 30 tablet 11 07/10/2023  gabapentin (NEURONTIN) 400 mg capsule Take 1 capsule (400 mg total) by mouth 2 (two) times daily with breakfast and dinner.   07/15/2023 at 1930  gabapentin (NEURONTIN) 600 mg tablet Take 1 tablet (600 mg total) by mouth nightly.   07/15/2023 at 2100  hydrOXYchloroQUINE (PLAQUENIL) 200 mg tablet Take 1 tablet (200 mg total) by mouth daily. 14 tablet 0 07/15/2023 at 0900  magnesium hydroxide (MILK OF MAGNESIA) 400 mg/5 mL suspension Take 30 mLs by mouth daily as needed for constipation.   Past Month  melatonin 3 mg tablet Take 1 tablet (3 mg total) by mouth nightly.  0 07/15/2023  pantoprazole (PROTONIX) 40 mg tablet Take 1 tablet (40 mg total) by mouth daily. 30 tablet 11 07/16/2023  rosuvastatin (CRESTOR) 10 mg tablet 1 tablet (10 mg total) by Per G Tube route nightly.   07/15/2023  traMADoL (ULTRAM) 50 mg tablet Take 1 tablet (50 mg total) by mouth every 8 (eight) hours as needed for pain.   07/13/2023  acetaminophen (TYLENOL) 325 mg tablet Take 2 tablets (650 mg total) by mouth every 8 (eight) hours as needed for pain. (Patient not taking: Reported on 07/16/2023) 30 tablet 0 Not Taking Inpatient Medications:I have reviewed the patient's current medication orders.Current Facility-Administered Medications Medication Dose Route Frequency Provider Last Rate Last Admin  acetaminophen (TYLENOL) tablet 975 mg  975 mg Oral Q6H Ferrel Logan, [place]   975 mg at 07/16/23 1127  [START ON 07/17/2023] ertapenem (INVanz) 1,000 mg in sodium chloride 0.9% 50 mL (mini-bag plus)  1,000 mg Intravenous Q24H Ferrel Logan, PA      famotidine (PEPCID) tablet 20 mg  20 mg Oral BID Ferrel Logan, PA      ferrous gluconate [facility]) tablet 324 mg  324 mg Oral Every Other Day Ferrel Logan, [place]      Melene Muller ON 07/17/2023] folic acid (FOLVITE) tablet 1 mg  1 mg Oral Daily Ferrel Logan, PA      gabapentin (NEURONTIN) capsule 400 mg  400 mg Oral BID WC Ferrel Logan, PA      gabapentin (NEURONTIN) tablet 600 mg  600 mg Oral Nightly Ferrel Logan, PA      heparin (PORCINE) injection 5,000 Units  5,000 Units Subcutaneous Q8H Ferrel Logan, [place]      [RUEAV ON 07/17/2023] hydroxychloroquine (PLAQUENIL) tablet 200 mg  200 mg Oral Daily Ferrel Logan, PA      melatonin tablet 3 mg  3 mg Oral Nightly Ferrel Logan, PA      rosuvastatin (CRESTOR) tablet 10 mg  10 mg Per G Tube  Nightly Ferrel Logan, PA       lactated ringers 50 mL/hr (07/16/23 0851) ondansetron **OR** ondansetron (PF), prochlorperazine **OR** prochlorperazine edisylate, traMADol, traMADoLCurrent Facility-Administered Medications Medication Dose Route Frequency Last Rate  acetaminophen  975 mg Oral Q6H    [START ON 07/17/2023] ertapenem  1,000 mg Intravenous Q24H    famotidine  20 mg Oral BID ferrous gluconate  324 mg Oral Every Other Day    [START ON 07/17/2023] folic acid  1 mg Oral Daily    gabapentin  400 mg Oral BID WC    gabapentin  600 mg Oral Nightly    heparin (PORCINE)  5,000 Units Subcutaneous Q8H    [START ON 07/17/2023] hydroxychloroquine  200 mg Oral Daily    lactated ringers  50 mL/hr Intravenous Continuous 50 mL/hr (07/16/23 0851)  melatonin  3 mg Oral Nightly    ondansetron  4 mg Oral Q6H PRN    Or  ondansetron (PF)  4 mg IV Push Q6H PRN    prochlorperazine  10 mg Oral Q6H PRN    Or  prochlorperazine edisylate  10 mg IV Push Q6H PRN    rosuvastatin  10 mg Per G Tube Nightly    traMADol  100 mg Oral Q6H PRN    traMADoL  50 mg Oral Q6H PRN   Review of Systems: Review of Systems Constitutional:  Negative for chills, diaphoresis, fatigue, fever and unexpected weight change. HENT:  Negative for congestion, dental problem, drooling, ear discharge, ear pain, facial swelling, hearing loss, mouth sores, postnasal drip, rhinorrhea, sinus pressure, sinus pain, sneezing, sore throat and trouble swallowing.  Eyes:  Negative for photophobia, pain, discharge, redness, itching and visual disturbance. Respiratory:  Negative for cough, chest tightness, shortness of breath and wheezing.  Cardiovascular:  Negative for chest pain, palpitations and leg swelling. Gastrointestinal:  Negative for abdominal distention, abdominal pain, blood in stool, constipation, diarrhea, nausea, rectal pain and vomiting. Endocrine: Negative for cold intolerance and heat intolerance. Genitourinary:  Negative for decreased urine volume, difficulty urinating, dysuria, flank pain, frequency, genital sores, hematuria, pelvic pain, urgency and vaginal discharge. Musculoskeletal:  Negative for arthralgias, back pain, gait problem, joint swelling, myalgias, neck pain and neck stiffness. Skin:  Negative for color change, pallor, rash and wound. Allergic/Immunologic: Negative for immunocompromised state. Neurological:  Negative for dizziness, seizures, syncope, facial asymmetry, weakness, light-headedness, numbness and headaches. Hematological:  Negative for adenopathy. Psychiatric/Behavioral:  Negative for agitation, behavioral problems, confusion, self-injury and suicidal ideas.  Physical Exam: Vital Signs:Blood pressure (!) 122/53, pulse 62, temperature 97.8 ?F (36.6 ?C), temperature source Oral, resp. rate 18, height 5' 10 (1.778 m), weight 58 kg, SpO2 99 %.Intake/Output:I have reviewed the patient's current I&O's as documented in the EMR. and Gross Totals (Last 24 hours) at 07/16/2023 1452Last data filed at 07/16/2023 1128Intake 600 ml Output 250 ml Net 350 ml Physical ExamConstitutional:     General: She is not in acute distress.   Appearance: She is not ill-appearing, toxic-appearing or diaphoretic. HENT:    Head: Normocephalic and atraumatic.    Nose: No congestion or rhinorrhea.    Mouth/Throat:    Mouth: Mucous membranes are moist.    Pharynx: Oropharynx is clear. No oropharyngeal exudate or posterior oropharyngeal erythema. Eyes:    General: No scleral icterus.      Right eye: No discharge.       Left eye: No discharge.    Conjunctiva/sclera: Conjunctivae normal.    Pupils: Pupils are equal, round, and reactive to light. Cardiovascular:  Rate and Rhythm: Normal rate and regular rhythm.    Heart sounds: No murmur heard.   No friction rub. No gallop. Pulmonary:    Effort: No respiratory distress.    Breath sounds: No wheezing, rhonchi or rales. Abdominal:    General: There is no distension.    Palpations: Abdomen is soft.    Tenderness: There is no abdominal tenderness. There is no right CVA tenderness, left CVA tenderness, guarding or rebound.    Comments: Ileostomy in place Genitourinary:   Comments: Foley in place with yellow urineMusculoskeletal:       General: No swelling or tenderness.    Cervical back: Neck supple. No rigidity or tenderness.    Right lower leg: No edema.    Left lower leg: No edema. Lymphadenopathy:    Cervical: No cervical adenopathy. Skin:   General: Skin is warm and dry.    Coloration: Skin is not jaundiced or pale.    Findings: No bruising, erythema, lesion or rash. Neurological:    General: No focal deficit present.    Mental Status: She is alert and oriented to person, place, and time.    Cranial Nerves: No cranial nerve deficit.    Sensory: No sensory deficit.    Motor: No weakness. Psychiatric:       Mood and Affect: Mood normal. Review of Labs/Diagnostics: Lab Review:I have reviewed the patient's labs within the last 24 hrs.Last 24 hours: Recent Results (from the past 24 hour(s)) Surgical Pathology [facility])  Collection Time: 07/16/23  9:39 AM Result Value Ref Range  Case Report     Surgical Pathology                                Case: [id number]                                Authorizing Provider:  [name], MD  Collected:           07/16/2023 09:39 AM        Ordering Location:     [facility]        Received:            07/16/2023 10:25 AM        Pathologist:           Rondel Oh, MD                                                       Specimens:   1) - Treatment Device, Explanted right ureteral stent                                             2) - Treatment Device, Explanted left ureteral stent                                   Final Diagnosis     1. Right ureteral stent, Removal:Stent (gross only).2. Left ureteral stent, Removal:Stent (gross only).        This  electronic signature indicates that the pathologist has personally reviewed the available gross and/or microscopic material and has based the diagnosis on that review.  Clinical History     Renal calculus  Gross Description     1. Treatment Device, Explanted right ureteral stentReceived in formalin labeled with the patient's name, DOB, Mr and explanted right ureteral stent? is a 37 x 0.2 cm blue portion of plastic tubing with black stripes.  The specimen is consistent with a stent.  No irregularities are grossly seen.  GROSS ONLY.  RTM2. Treatment Device, Explanted left ureteral stentReceived in formalin labeled with the patient's name, DOB, Mr and explanted left ureteral stent? is a 37 x 0.2 cm blue portion of plastic tubing with black stripes.  The specimen is consistent with a stent.  No irregularities are grossly seen.  GROSS ONLY.  [facility]        Pathology [facility] DEPARTMENT OF PATHOLOGY[address] [place], [place] [id number]: Reeves Forth, M.D.Phone: [phone]: [id number]                                [place]: PFI [id number]: [id number] CBC auto differential  Collection Time: 07/16/23 10:29 AM Result Value Ref Range  WBC 5.5 4.0 - 11.0 x1000/?L  RBC 3.96 (L) 4.00 - 6.00 M/?L  Hemoglobin 10.7 (L) 11.7 - 15.5 g/dL  Hematocrit 23.55 73.22 - 45.00 %  MCV 88.4 80.0 - 100.0 fL  MCH 27.0 27.0 - 33.0 pg  MCHC 30.6 (L) 31.0 - 36.0 g/dL  RDW-CV 02.5 (H) 42.7 - 15.0 %  Platelets 417 150 - 420 x1000/?L  MPV 10.0 8.0 - 12.0 fL  Neutrophils 56.9 39.0 - 72.0 %  Lymphocytes 24.8 17.0 - 50.0 %  Monocytes 10.6 4.0 - 12.0 %  Eosinophils 6.0 (H) 0.0 - 5.0 %  Basophil 1.5 (H) 0.0 - 1.4 %  Immature Granulocytes 0.2 0.0 - 1.0 %  nRBC 0.0 0.0 - 1.0 %  Absolute Lymphocyte Count 1.36 0.60 - 3.70 x 1000/?L  Monocyte Absolute Count 0.58 0.00 - 1.00 x 1000/?L  Eosinophil Absolute Count 0.33 0.00 - 1.00 x 1000/?L  Basophil Absolute Count 0.08 0.00 - 1.00 x 1000/?L  Absolute Immature Granulocyte Count 0.01 0.00 - 0.30 x 1000/?L  Absolute nRBC 0.00 0.00 - 1.00 x 1000/?L  ANC (Abs Neutrophil Count) 3.12 2.00 - 7.60 x 1000/?L Basic metabolic panel  Collection Time: 07/16/23 10:29 AM Result Value Ref Range  Sodium 141 136 - 145 mmol/L  Potassium 4.2 3.5 - 5.1 mmol/L  Chloride 115 95 - 115 mmol/L  CO2 19 (L) 21 - 32 mmol/L  Anion Gap 7 5 - 18  Glucose 81 70 - 100 mg/dL  BUN 13 8 - 25 mg/dL  Creatinine 0.62 3.76 - 1.30 mg/dL  Calcium 28.3 (H) 8.4 - 10.3 mg/dL  BUN/Creatinine Ratio 15.1 8.0 - 25.0  Osmolality Calculation 280 275 - 295 mOsm/kg  eGFR (Creatinine) >60 >=60 mL/min/1.37m2 C-reactive protein (CRP)  Collection Time: 07/16/23 10:29 AM Result Value Ref Range  C-Reactive Protein 0.8 0.0 - 1.0 mg/dL Diagnostic Review:No new radiology.  Impression: 67 yo F w/ PMH of rheumatoid arthritis on hydroxychloroquine, DVT on apixaban, perforated diverticulitis s/p right hemicolectomy/ileostomy c/b abdominal wall abscess (2023), IDA, and recent hospitalization for urosepsis 2/2 obstructing stone s/p bilateral ureteral stents (7/5) who was admitted on 07/25 after sustaining ureteral injury during procedure earlier today.  Please see my consult note from 07/25 for  details regarding presentation.  Admitted to [facility] couple of weeks ago for urosepsis 2/2 obstructing right ureteral stone s/p b/l stent placement.  Blood and urine cultures grew Klebsiella pneumoniae s/p IV ceftriaxone followed by oral cefuroxime.  Presented to [facility] on 07/25 for surgical management stone.  Preop urine culture on 07/19 grew ESBL E coli.  Started on oral Bactrim as an outpatient.  Dr. Hulen Luster performed cystoscopy with right stent removal and replacement of left ureteral stent on 07/25.  Surgery complicated by small left ureter perforation while attempting to remove stone which was unsuccessful.  Received perioperative ertapenem.  Subsequently admitted to [facility] for monitoring.#Complicated ESBL E coli UTI in the setting of left-sided stone and new ureteral perforation.  Remains afebrile without evidence of systemic inflammation.  Agree with current antibiotic regimenRecommendations: -maintain contact precautions for ESBL pathogen-follow up OR urine culture and  A/P tomorrow -check set of blood cultures if febrile-continue with ertapenem for now-final duration dependent on clinical course-management of ureteral injury/stone per UrologyDiscussed with Dr. Hulen Luster earlier Kathreen Devoid, M.D.Infectious Diseases Pager: [phone]: [phone]: [phone]

## 2023-07-16 NOTE — Brief Op Note
[facility] HealthPatient Name: Danielle [name]        [id number] Patient DOB: 1956-01-27     Surgery Date: 7/25/2024Surgeon(s) and Role:   * [name], MD - PrimaryAssistant(s):* No surgical staff found *Staff:  Circulator: Pilar Jarvis, RN; Drue Flirt, RNRadiology Technologist: Toma Aran Circulator: Carlyon Prows, RNScrub Person: Freida Busman, JenniferPre-Op Diagnosis: Renal calculus [N20.0] Procedure(s) and Anesthesia Type:   * CYSTOSCOPY LEFT  STENT REMOVAL AND REPLACEMENT - GENERAL   * REMOVAL, RIGHT URETERAL STENT - GENERALOperative Findings (enter relevant operative findings; do not refer to an operative report that is not yet transcribed):left mid urteral stone With perforation/Signs of infection present at the time of surgery at the operative site: None Blood and Blood Products: none                 Drains:  none and Urinary Catheter (Foley)Implants: Implant Name Type Inv. Item Serial No. Manufacturer Lot No. LRB No. Used Action STENT CONTOUR 3F X 26CM - [id number] Stent, Urological STENT CONTOUR 3F X 26CM  [employer] [id number] Left 1 Replacement Device STENT CONTOUR 447F X 24CM - [id number] Stent, Urological STENT CONTOUR 447F X 24CM  [employer] [id number] Right 1 Explanted STENT CONTOUR 447F X 24CM - [id number] Stent, Urological STENT CONTOUR 447F X 24CM  [employer] [id number] Left 1 Explanted  Specimens: ID Type Source Tests Collected by Time 1 : Explanted Right Ureteral Stent Gross Only Treatment Device SURGICAL PATHOLOGY ([facility]) [name], MD 07/16/2023  9:39 AM 2 : Explanted left ureteral stent Gross Only Treatment Device SURGICAL PATHOLOGY ([facility]) [name], MD 07/16/2023  9:47 AM  Intra-op Labs (16h ago, onward)   Start     Ordered  07/16/23 0937  Urine culture  [[id number]]  Once      Question Answer Comment Collection Method: Sterile  Release to patient Immediate    07/16/23 0936    Clinical Staging:EBL:    Post Operative Diagnosis: * No post-op diagnosis entered [employer], MD7/25/202410:11 AM

## 2023-07-16 NOTE — Telephone Encounter
-----   Message from [name], MD sent at 07/16/2023 12:05 PM EDT -----Change her [place] scan for a month then I will see her at that time thanks

## 2023-07-16 NOTE — Other
Post Anesthesia Transfer of Care NotePatient: Telena A MaloneProcedure(s) Performed: Procedure(s) (LRB):CYSTOSCOPY LEFT  STENT REMOVAL AND REPLACEMENT (Left)REMOVAL, RIGHT URETERAL STENT (Right)Last Vitals: I have reviewed the post-operative vital signs during the handoff as noted in the Epic chart.POSTOP HANDOFF :      Patient Location:  PACU     Level of Consciousness:  Awake     VS stable since last recorded intra-op set? Yes       Oxygen source: maskPatient co-morbidities, intra-operative course, intake & output and antibiotics as per Anesthesia record were discussed with the RN.

## 2023-07-16 NOTE — Anesthesia Pre-Procedure Evaluation
This is a 67 y.o. female scheduled for CYSTOSCOPY LEFT  LASER LITHOTRIPSY, STENT PLACEMENT (Left)REMOVAL, RIGHT URETERAL STENT (Right: Ureter).Review of Systems/ Medical HistoryPatient summary, nursing notes, EKG/Cardiac Studies , Labs, pre-procedure vitals, height, weight and NPO status reviewed.No previous anesthesia concernsAnesthesia Evaluation:   No history of anesthetic complications  Estimated body mass index is 18.35 kg/m? as calculated from the following:  Height as of this encounter: 5' 10 (1.778 m).  Weight as of this encounter: 58 kg. 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   -Exercise tolerance: >4 METS -Vascular Disease:  Negative    Respiratory:  Negative.-Airway disorders:      -Asthma: noHEENT: Negative.Neuromuscular: NegativeSkeletal/Skin:  NegativeGastrointestinal/Genitourinary:  Negative -Gastrointestinal Disorders:  Patient has GERD.-Hepatic Disorders:  She does not have liver disease.-Nutritional Disorders: Pt is cachectic per BMI definition, (BMI <20).-Renal Disorders:  She does not have renal insufficiency.. Hematological/Lymphatic: NegativeEndocrine/Metabolic: Negative.-Diabetes mellitus:  Patient has diabetes mellitus.Behavioral/Social/Psychiatric & Syndromes: NegativePhysical ExamCardiovascular:    normal exam  Rhythm: regularHeart Sounds: S1 present and S2 present.Pulmonary:  normal exam  Airway:  Mallampati: IITM distance: >3 FBNeck ROM: fullDental:  unremarkable  Other Findings: Dentures removed (?) - missing several lower molarsAnesthesia PlanASA 2 The primary anesthesia plan is  general. Anesthesia informed consent obtained. Consent obtained from: healthcare power of attorneyConsent Comment: [name] mcculley- conservatorThe post operative pain plan is per surgeon management.Anesthesiologist's Pre Op NoteI personally evaluated and examined the patient prior to the intra-operative phase of care on the day of the procedure.Marland Kitchen

## 2023-07-17 ENCOUNTER — Ambulatory Visit: Admit: 2023-07-17 | Payer: MEDICARE | Primary: Nephrology

## 2023-07-17 ENCOUNTER — Telehealth: Admit: 2023-07-17 | Payer: PRIVATE HEALTH INSURANCE | Attending: Urology | Primary: Nephrology

## 2023-07-17 DIAGNOSIS — R64 Cachexia: Secondary | ICD-10-CM

## 2023-07-17 DIAGNOSIS — Z7901 Long term (current) use of anticoagulants: Secondary | ICD-10-CM

## 2023-07-17 DIAGNOSIS — D649 Anemia, unspecified: Secondary | ICD-10-CM

## 2023-07-17 DIAGNOSIS — N200 Calculus of kidney: Principal | ICD-10-CM

## 2023-07-17 DIAGNOSIS — M069 Rheumatoid arthritis, unspecified: Secondary | ICD-10-CM

## 2023-07-17 DIAGNOSIS — G8220 Paraplegia, unspecified: Secondary | ICD-10-CM

## 2023-07-17 DIAGNOSIS — N132 Hydronephrosis with renal and ureteral calculous obstruction: Secondary | ICD-10-CM

## 2023-07-17 DIAGNOSIS — E119 Type 2 diabetes mellitus without complications: Secondary | ICD-10-CM

## 2023-07-17 DIAGNOSIS — Z681 Body mass index (BMI) 19 or less, adult: Secondary | ICD-10-CM

## 2023-07-17 DIAGNOSIS — K219 Gastro-esophageal reflux disease without esophagitis: Secondary | ICD-10-CM

## 2023-07-17 DIAGNOSIS — Z931 Gastrostomy status: Secondary | ICD-10-CM

## 2023-07-17 DIAGNOSIS — Z79899 Other long term (current) drug therapy: Secondary | ICD-10-CM

## 2023-07-17 DIAGNOSIS — Z8619 Personal history of other infectious and parasitic diseases: Secondary | ICD-10-CM

## 2023-07-17 LAB — URINE CULTURE

## 2023-07-17 MED ORDER — POTASSIUM CITRATE ER 15 MEQ (1,620 MG) TABLET,EXTENDED RELEASE
15 | Freq: Two times a day (BID) | ORAL | Status: AC
Start: 2023-07-17 — End: ?

## 2023-07-17 MED ORDER — ACETAMINOPHEN 325 MG TABLET
325 | ORAL_TABLET | Freq: Four times a day (QID) | ORAL | 1 refills | Status: AC | PRN
Start: 2023-07-17 — End: ?

## 2023-07-17 MED ORDER — SULFAMETHOXAZOLE 800 MG-TRIMETHOPRIM 160 MG TABLET
800-160 | ORAL_TABLET | Freq: Two times a day (BID) | ORAL | 1 refills | Status: AC
Start: 2023-07-17 — End: ?

## 2023-07-17 MED ORDER — LACTATED RINGERS IV BOLUS FROM BAG
INTRAVENOUS | Status: CP
Start: 2023-07-17 — End: ?
  Administered 2023-07-17: 16:00:00 1000.000 mL/h via INTRAVENOUS

## 2023-07-17 MED ORDER — POLYETHYLENE GLYCOL 3350 17 GRAM ORAL POWDER PACKET
17 | Freq: Every day | ORAL | 3 refills | Status: AC
Start: 2023-07-17 — End: ?

## 2023-07-17 NOTE — Progress Notes
Patient with multiple medical problems nursing home patientWas seen with urosepsis status post passage of right stone has a stone in the left mid ureterPatient was admitted to the hospital for IV antibiotics in light of high-risk infection due to poor dehydration nursing home during procedure there was a small perforation due to anatomy will it width admitted for observation and antibioticsPatient's [place] scan today shows no collection excellent drainageWould DC home with antibiotics for 10 days and then we will re-evaluate in 4 weeks for reoperation

## 2023-07-17 NOTE — Progress Notes
[facility]	Infectious Disease Progress NoteAttending Provider: [name], MD Subjective Subjective: Interim History:  No acute events overnight.  Refused labs this morning.  Plan to remove Foley for void trial today.  Feels well today.  No complaints.  Denies fever or chills.  Plan to discharge to nursing facility today.D5 total abx: D2 ertapenem after 3 days of oral Bactrim (outpatient)Afebrile CRP-0.8No leukocytosis Micro:Blood cultures (3/18):  ESBL E coliUrine culture (3/18):  ESBL E coliBlood cultures (7/5):  Klebsiella pneumoniaeUrine culture (7/5):  Klebsiella pneumoniaeUrine culture (7/19):  ESBL E coliOR urine culture (7/25-peri abx):  PendingReview of Allergies/Meds/Hx: I have reviewed the patient's: allergies, current scheduled medications, current infusions, current prn medications, past medical history, past surgical history, family history, social history, and prior to admission medicationsScheduled Meds:Current Facility-Administered Medications Medication Dose Route Frequency Provider Last Rate Last Admin  acetaminophen (TYLENOL) tablet 975 mg  975 mg Oral Q6H Ferrel Logan, PA   975 mg at 07/16/23 1127  ertapenem (INVanz) 1,000 mg in sodium chloride 0.9% 50 mL (mini-bag plus)  1,000 mg Intravenous Q24H Ferrel Logan, PA 100 mL/hr at 07/17/23 0842 1,000 mg at 07/17/23 0842  famotidine (PEPCID) tablet 20 mg  20 mg Oral BID Ferrel Logan, PA   20 mg at 07/16/23 2101  ferrous gluconate (FERGON) tablet 324 mg  324 mg Oral Every Other Day Dearwester, Dicie Beam, PA   324 mg at 07/16/23 2200  folic acid (FOLVITE) tablet 1 mg  1 mg Oral Daily Ferrel Logan, PA   1 mg at 07/17/23 2130  gabapentin (NEURONTIN) capsule 400 mg  400 mg Oral BID WC Ferrel Logan, PA   400 mg at 07/17/23 8657  gabapentin (NEURONTIN) tablet 600 mg  600 mg Oral Nightly Ferrel Logan, PA   600 mg at 07/16/23 2304  heparin (PORCINE) injection 5,000 Units  5,000 Units Subcutaneous Q8H Ferrel Logan, [place]   5,000 Units at 07/17/23 8469  hydroxychloroquine (PLAQUENIL) tablet 200 mg  200 mg Oral Daily Ferrel Logan, PA   200 mg at 07/17/23 6295  lactated ringers bolus from bag 500 mL  500 mL Intravenous Bolus from Bag [place], [place], [place]      melatonin tablet 3 mg  3 mg Oral Nightly Ferrel Logan, PA      polyethylene glycol (MIRALAX) packet 17 g  17 g Oral Daily Ferrel Logan, [place]   17 g at 07/17/23 2841  rosuvastatin (CRESTOR) tablet 10 mg  10 mg Per G Tube Nightly Ferrel Logan, PA   10 mg at 07/16/23 2200  senna (SENOKOT) tablet 8.6 mg  1 tablet Oral Nightly Ferrel Logan, PA   8.6 mg at 07/16/23 2200 Continuous Infusions: lactated ringers 50 mL/hr (07/17/23 0311) PRN Meds:.bisacodyL, bisacodyL, magnesium hydroxide, ondansetron **OR** ondansetron (PF), oxyCODONE, prochlorperazine **OR** prochlorperazine edisylate, traMADol, traMADoL Objective Objective: Vitals:I have reviewed the patient's current vital signs as documented in the patient's EMR.  , Last 24 hours: Temp:  [98.1 ?F (36.7 ?C)-100.4 ?F (38 ?C)] 100.4 ?F (38 ?C)Pulse:  [62-76] 73Resp:  [15-18] 18BP: (88-113)/(55-71) 113/71SpO2:  [94 %-100 %] 95 %, and O2 Sat and Device used: SpO2: 95 %Device (Oxygen Therapy): room airI/O's:I have reviewed the patient's current I&O's as documented in the EMR.Gross Totals (Last 24 hours) at 07/17/2023 1326Last data filed at 07/17/2023 1111Intake -- Output 2850 ml Net -2850 ml Physical ExamConstitutional:     General: She is not in acute distress.   Appearance: She is not ill-appearing, toxic-appearing or diaphoretic. HENT:    Head: Normocephalic and atraumatic.  Nose: No congestion or rhinorrhea.    Mouth/Throat:    Mouth: Mucous membranes are moist.    Pharynx: Oropharynx is clear. No oropharyngeal exudate or posterior oropharyngeal erythema. Eyes:    General: No scleral icterus. Right eye: No discharge.       Left eye: No discharge.    Conjunctiva/sclera: Conjunctivae normal.    Pupils: Pupils are equal, round, and reactive to light. Cardiovascular:    Rate and Rhythm: Normal rate and regular rhythm.    Heart sounds: No murmur heard.   No friction rub. No gallop. Pulmonary:    Effort: No respiratory distress.    Breath sounds: No wheezing, rhonchi or rales. Abdominal:    General: There is no distension.    Palpations: Abdomen is soft.    Tenderness: There is no abdominal tenderness. There is no right CVA tenderness, left CVA tenderness, guarding or rebound.    Comments: Ileostomy in place Genitourinary:   Comments: Foley in place with yellow urineMusculoskeletal:       General: No swelling or tenderness.    Cervical back: Neck supple. No rigidity or tenderness.    Right lower leg: No edema.    Left lower leg: No edema. Lymphadenopathy:    Cervical: No cervical adenopathy. Skin:   General: Skin is warm and dry.    Coloration: Skin is not jaundiced or pale.    Findings: No bruising, erythema, lesion or rash. Neurological:    General: No focal deficit present.    Mental Status: She is alert and oriented to person, place, and time.    Cranial Nerves: No cranial nerve deficit.    Sensory: No sensory deficit.    Motor: No weakness. Psychiatric:       Mood and Affect: Mood normal. Labs:No new labsDiagnostics:I have reviewed the patient's Radiology report(s) within the last 48 hrs. Significant findings are :.[place] A/P:FINDINGS: Since prior examination, there is been resolution of a right UVJ calculus. There is mild right pelvocaliectasis. There is been interval decrease in right perinephric stranding. A nonobstructing right renal calculus is unchanged.There is been interval placement of a left ureteral stent. A probable mid left ureteral calculus is no longer clearly demonstrated. There is a stable nonobstructing calculus in the lower zone the left kidney. There is no left hydronephrosis.There is no perinephric or retroperitoneal collection.There is a Foley catheter in place with its distal tip in the bladder.There are numerous gallstones are reidentified.There is stable extensive diverticulosis left colon.There is a right lower quadrant ostomy reidentified. There is stable right basilar atelectasis or consolidation. IMPRESSION: 1. Interval resolution of right UVJ calculus.2. Left ureteral stent in place.3. Nonobstructing renal calculi.4. No perinephric or retroperitoneal collection.ECG/Tele Events: I have reviewed the patient's ECG as resulted in the EMR. Assessment Assessment: 67 yo F w/ PMH of rheumatoid arthritis on hydroxychloroquine, DVT on apixaban, perforated diverticulitis s/p right hemicolectomy/ileostomy c/b abdominal wall abscess (2023), IDA, and recent hospitalization for urosepsis 2/2 obstructing stone s/p bilateral ureteral stents (7/5) who was admitted on 07/25 after sustaining ureteral injury during procedure earlier today.  Please see my consult note from 07/25 for details regarding presentation.  Admitted to [facility] couple of weeks ago for urosepsis 2/2 obstructing right ureteral stone s/p b/l stent placement.  Blood and urine cultures grew Klebsiella pneumoniae s/p IV ceftriaxone followed by oral cefuroxime.  Presented to [facility] on 07/25 for surgical management stone.  Preop urine culture on 07/19 grew ESBL E coli.  Started on oral Bactrim as an outpatient.  Dr. Hulen Luster  performed cystoscopy with right stent removal and replacement of left ureteral stent on 07/25.  Surgery complicated by small left ureter perforation while attempting to remove stone which was unsuccessful.  Received perioperative ertapenem.  Subsequently admitted to [facility] for monitoring.  [place] scan on 07/26 showed resolution of right UVJ stone, left ureteral stent in place, prior left ureteral stone not clearly seen, bilateral nonobstructing renal stones, and no fluid collections. #Complicated ESBL E coli UTI in the setting of left-sided stone and new ureteral perforation.  Remains afebrile without evidence of systemic inflammation Plan Plan: -maintain contact precautions for ESBL pathogen-follow up OR urine culture-check set of blood cultures if febrile-continue with ertapenem for now-tentative plan is for 10-14 days of total therapy, currently on day 5.  Can switch to oral Bactrim on discharge-management of ureteral injury/stone per UrologyJames Andrey Farmer, M.D.Infectious Diseases Pager: [phone]: [phone]: [phone]

## 2023-07-17 NOTE — Plan of Care
Plan of Care Overview/ Patient Status    0700: Received patient in bed during dual signoff. Patient is alert and oriented x3 forgetful. Foley in place and draining clear yellow urine. Getting IVF. Passing gas and moving bowels. Ostomy in place. Tolerating the diet. Denies nausea/vomiting. Left side of the abdomen with foam dressing to the wound. Ostomy to the right side of the abdomen.Patient and family aware of the patient. Bed in low position. Hourly rounding. Bed alarm in use. Call bell and personal items within reach. Continue to monitor.0930: Patient send for [place] scan in stretcher and done. 1100: Foley removed as per order. Complete sponge bath done. Linen changed. [place] in place. Due to void. Refused mouth care. Bolus 500 ml given. CSM +. Pulses +. No c/o numbness/tingling. No SOB or chest pain. No distress noted. Abdomen is soft and non distended. Bowel sounds +. Lungs clear and diminished. Denies pain to the calf. Dorsi/plantar flexion is weak on B/L extremities. Skin is intact except for left side of the abdomen. C/o generalized pain. 1330: Patient voided 200 cc and bladder scanned for post void 52 cc.1430: IV catheter discontinued intact. Site without signs and symptoms of complications. Dressing and pressure applied. 1440: Patient felt warm checked Temperature 98.1 F orally.1457: Marla Roe to give report. Report endorsed to the RN.Problem: Adult Inpatient Plan of CareGoal: Plan of Care ReviewOutcome: Outcome(s) achievedGoal: Patient-Specific Goal (Individualized)Outcome: Outcome(s) achievedGoal: Absence of Hospital-Acquired Illness or InjuryOutcome: Outcome(s) achievedGoal: Optimal Comfort and WellbeingOutcome: Outcome(s) achievedGoal: Readiness for Transition of CareOutcome: Outcome(s) achieved Problem: Diabetes ComorbidityGoal: Blood Glucose Level Within Targeted RangeOutcome: Outcome(s) achieved Problem: Skin Injury Risk IncreasedGoal: Skin Health and IntegrityOutcome: Outcome(s) achieved Problem: Fall Injury RiskGoal: Absence of Fall and Fall-Related InjuryOutcome: Outcome(s) achieved Problem: Pain AcuteGoal: Optimal Pain Control and FunctionOutcome: Outcome(s) achieved

## 2023-07-17 NOTE — Progress Notes
[facility]	 Progress NoteAttending Provider: [name], MD Post-Operative Day: 1 Day Post-Op S/P b/l stent removal and replacement of L stent (c/b L ureteral perforation) Subjective: Interim History: Patient seen and examined. No significant events overnight. Patient reports no pain. Patient tolerating PO intake. Voiding via foley with good urine output, and stool in ostomy bag. Patient denies any acute complaints, refused lab work this morning, stating she does not want it. Objective: Vitals:Most recent: Patient Vitals for the past 24 hrs: BP Temp Temp src Pulse Resp SpO2 07/17/23 0748 97/60 98.1 ?F (36.7 ?C) Axillary 76 18 97 % 07/17/23 0520 (!) 91/59 98.9 ?F (37.2 ?C) Oral 66 18 94 % 07/16/23 2312 99/64 -- -- 74 -- 98 % 07/16/23 2057 (!) 88/55 98.3 ?F (36.8 ?C) Oral 68 18 100 % 07/16/23 1634 101/65 98.4 ?F (36.9 ?C) Oral 62 15 100 % 07/16/23 1141 (!) 122/53 97.8 ?F (36.6 ?C) Oral 62 18 99 % 07/16/23 1114 106/60 -- -- 65 16 100 % 07/16/23 1059 119/65 -- -- (!) 58 15 100 % 07/16/23 1044 135/77 -- -- 61 14 100 % 07/16/23 1029 117/69 -- -- 61 16 100 % 07/16/23 1014 119/74 -- -- 61 14 100 % 07/16/23 0959 (!) 140/77 98.5 ?F (36.9 ?C) Temporal 68 (!) 21 99 % Physical ExamGeneral: Alert, awake, resting comfortably in bed.Respiratory: normal effort, non-laboredAbdomen: soft, NTND, stoma bag with stool. GU: foley intact draining  yellow urine with sediment noted in catheter.  Labs:Complete Blood CountResults in Past 7 DaysResult Component Current Result Hematocrit 35.00 (07/16/2023) Hemoglobin 10.7 (L) (07/16/2023) MCH 27.0 (07/16/2023) MCHC 30.6 (L) (07/16/2023) MCV 88.4 (07/16/2023) MPV 10.0 (07/16/2023) Platelets 417 (07/16/2023) RBC 3.96 (L) (07/16/2023) WBC 5.5 (07/16/2023) Basic Metabolic PanelResults in Past 7 DaysResult Component Current Result BUN 13 (07/16/2023) Calcium 10.5 (H) (07/16/2023) Chloride 115 (07/16/2023) CO2 19 (L) (07/16/2023) Creatinine 0.82 (07/16/2023) Glucose 81 (07/16/2023) Potassium 4.2 (07/16/2023) Sodium 141 (07/16/2023) Assessment: 67 year old female pmh RA, GERD, embolism 1 Day Post-Op S/P CYSTOSCOPY LEFT  LASER LITHOTRIPSY, STENT PLACEMENT, REMOVAL, RIGHT URETERAL STENT.  Patient HD stable. Afebrile. Plan: - Diet regular- f/u [place] a/p - d/c foley today, f/u TOV- Pain control, anti-emetics PRN- Ambulate, IS- DVT ppx: scds, SQH- Ertapenum per ID- F/u Ucx- Appreciate ID recs - Dispo: home pending [place] and void trial D/w Dr. Hulen Luster Signed:Connee Ikner Loistine Chance, [facility]

## 2023-07-17 NOTE — Utilization Review (ED)
UM Status: UR Reviewed-Continue Outpatient Services  POD#1, cystoscopy, IV antibiotics. Messaged PA Loistine Chance, plan for dc today

## 2023-07-17 NOTE — Plan of Care
Plan of Care Overview/ Patient Status    67 yr old female S/P cystoscopy and new ureteral perforation. Spoke with pt and she is asking that CM spoke to her conservator Danielle [name]. Spoke with Danielle [name], pt is a long term resident at [facility]. Plan for DC to facility when medically stable. [id number] Spoke with Dr. Hulen Danielle and PA Ferrel Logan. Pt medically stable for DC. TC [name] will update facility of DC. Transportation arranged by [facility] for 1500. Called conservator Danielle [name] and she is in agreement with DC to [place] for long term care. Case Management Evaluation  Flowsheet Row Most Recent Value Case Management Evaluation and Plan  Arrived from prior to admission SNF (receiving long term care)  Lives With Other (see comments) Patient Requires Care Coordination Intervention Due To discharge planning needs/concerns Living Environment   Lives With Other (see comments) Current Living Arrangements extended care facility Home Accessibility no concerns Transportation Available other (see comments)  [To be arranged by SNF] Home Safety  Feels Safe Living In Home yes Source of Clinical History  Patient's clinical history has been reviewed and source of Information is: Patient, Conservator Case Psychologist, occupational  I have reviewed the medical record and completed the above evaluation with the following recommendations. Yes Discharge Planning Coordination Recommendations  Discharge Planning Coordination Recommendations Skilled Nursing Facility for long term care Case Manager reviewed plan of care/ continuum of care need's with  Patient Representative, Patient   Case Management Screening  Flowsheet Row Most Recent Value Case Management Screening: Chart review completed. If YES to any question below then proceed to CM Eval/Plan  Is there a change in their cognitive function No Do you anticipate that the pt will have any discharge needs requiring CM intervention? No Has there been a readmission within the last 30 days and/or four (4) encounters (encounters include: ED, OBS, Inpatient) within the last six (6) months? No Were there services prior to admission ( Examples: Assisted Living, HD, Homecare, Extended Care Facility, Methadone, SNF, Outpatient Infusion Center) No Negative/Positive Screen Positive Screening: Complete CM Evaluation and Plan Case Manager Attestation  I have reviewed the medical record and completed the above screen. CM staff will follow patient's progress and discuss the plan of care with the Treatment Team. Yes   Case Management will take lead to arrange post -acute care services and continue to work with the care team as the patient progresses towards discharge.

## 2023-07-17 NOTE — Telephone Encounter
-----   Message from [name], MD sent at 07/17/2023  9:12 AM EDT -----Very important pt nursing home pt has stent make sure she follows up 4 week with [place] prior think I send note

## 2023-07-17 NOTE — Other
[facility] REPORTPat Name:  Danielle [name], Danielle [name] No:  [id number] D.O.B.:  July 23, 1957Dict MD:  [name], M.D.Attend MD:  [name], M.D.Referring MD:  [name], M.D.Adm/Ser DT:  07/25/2024Disch DT:   CSN:  [id number] Pat Locat:  [id number] DATE:  07/25/2024SURGEON:  [name], M.D.PREOPERATIVE DIAGNOSES:1.	Bilateral renal calculi.2.	Left mid ureteral stone.PROCEDURE:1.	Cystoscopy.2.	Removal of right stent.3.	Left ureteroscopy with re-insertion of left ureteral stent.INDICATION:This is a long complicated history, specifically 67 year old female who was initially seen by myself as a consultation on June 27, 2023.  At that time, she had urinary sepsis from a right UVJ stone that was subsequently passed on its own, had a stent insertion on the right side.  She also had an asymptomatic left mid ureteral stone that was seen several weeks prior to our July 6 admission.  At that time, she was admitted and had antibiotics. Her culture on July 5 was positive for Klebsiella that was sensitive to all cephalosporins.  She subsequently defervesced, did well, was discharged and was seen in the office on July 12 where straight cath urine culture was negative.  On the 19th, another straight cath culture was done that showed E coli that was resistant to multiple antibiotics except for Invanz, Amikacin, and Bactrim.  At this point, the patient was brought to the operative room for definitive evaluation.  She is a nursing home patient, contracted multiple medical issues.  The plan was to remove the right stent and ureteroscopy on the left.PROCEDURE IN DETAIL:The patient was brought to the operating room.  The patient received Invanz, 10000 units of subcu heparin because of her previous blood thinning situation.  Cystourethroscopy performed.  The ureter was mildly murky, was sent for culture.  The right stent was removed without problems.  The left stent was grasped over the guidewire, was advanced to the renal pelvis.  Open-ended was placed up the renal pelvis.  Minimal hydronephrosis.  Slightly cloudy urine. At this point, semi rigid ureteroscope was advanced into the distal ureter.  This was relatively easy.  Upon entering the mid ureter, there was dense inflammation probably aberrant ureteral anatomy due to her contracture.  There was significant stenosis at this point as the ureteroscope was advanced. There was small perforation seen.  Retrograde showed some mild extravasation in light of a possibly infected urine.  We proceeded to terminate the procedure.  Ureteroscope was removed.  A 7 x 26 long-acting contour stent was placed under guidance.  Foley catheter was placed and the patient will be admitted for IV antibiotics.Job #:  [id number] D:  07/16/2023 10:17 T:  07/16/2023 [phone] [id number] MEDQ  **Information in this report should not be considered final until authenticated by the author and should not be re-disclosed.**

## 2023-07-17 NOTE — Plan of Care
Plan of Care Overview/ Patient Status    1900: Dual RN bedside handoff completed.Patient is alert and oriented x3, disoriented to situation and forgetful, resting comfortably on room air. Denies chest pain/sob. Wheelchair bound, at the bedside. Foley in place, Ileostomy to RLQ, small amount noted in bag. Pink-ish urine noted in beginning of shift, in AM- Light cloudy yellow urine, PA notified. Tolerating regular diet. Denies nausea/vomiting. Encouraged to use IS 10x per waking hour. Abdomen soft, non tender, + bs. Pink Foam dressing over small skin opening on L side of abdomen, patient unable to remember how wound occurred, clean, dry and intact. + 2 pp. Denies calf pain. Refusing SCDs. IVF infusing.All medications given per [facility]. Scheduled 9 pm medications spaced out per pharmacist. Patient refusing blood work in AM. Refusing tylenol. Ostomy emptied, see flowsheet for output.Patient/Family acknowledge understanding of fall prevention education including to call nurse with assistance with ambulation.Call bell within reach. Essentials within reach. Bed alarm on. Bed in the lowest position. Safety maintained. Hourly rounding checks performed, patient checked on frequently.Problem: Diabetes ComorbidityGoal: Blood Glucose Level Within Targeted RangeOutcome: Interventions implemented as appropriate Problem: Skin Injury Risk IncreasedGoal: Skin Health and IntegrityOutcome: Interventions implemented as appropriate Problem: Fall Injury RiskGoal: Absence of Fall and Fall-Related InjuryOutcome: Interventions implemented as appropriate Problem: Pain AcuteGoal: Optimal Pain Control and FunctionOutcome: Interventions implemented as appropriate Problem: Adult Inpatient Plan of CareGoal: Plan of Care ReviewOutcome: Interventions implemented as appropriateGoal: Patient-Specific Goal (Individualized)Outcome: Interventions implemented as appropriateGoal: Absence of Hospital-Acquired Illness or InjuryOutcome: Interventions implemented as appropriateGoal: Optimal Comfort and WellbeingOutcome: Interventions implemented as appropriateGoal: Readiness for Transition of CareOutcome: Interventions implemented as appropriate Problem: Wound Healing ProgressionGoal: Optimal Wound HealingOutcome: Interventions implemented as appropriate

## 2023-07-20 NOTE — Telephone Encounter
FYI - Pt scheduled on 8/22 at [address] for [place] scan and fu with md 9/3

## 2023-07-20 NOTE — Telephone Encounter
[place] [facility] [address] 9/3

## 2023-07-20 NOTE — Telephone Encounter
Noted thanks °

## 2023-08-13 ENCOUNTER — Inpatient Hospital Stay: Admit: 2023-08-13 | Discharge: 2023-08-13 | Payer: MEDICARE | Primary: Nephrology

## 2023-08-13 ENCOUNTER — Telehealth: Admit: 2023-08-13 | Payer: PRIVATE HEALTH INSURANCE | Attending: Urology | Primary: Nephrology

## 2023-08-13 DIAGNOSIS — N201 Calculus of ureter: Secondary | ICD-10-CM

## 2023-08-13 NOTE — Telephone Encounter
2 threads going, adding last note from other message

## 2023-08-13 NOTE — Telephone Encounter
Copied from CRM #[id number]. Topic: General Inquiry - General Inquiry>> Aug 13, 2023 11:22 AM Jarod Bozzo C wrote:Suborna - Radiology Dept in [place] called in stating that pt was supposed to have her [place] scan done today, but the radiologist wants her to have BW done prior.Please have MD order BUN and Creatinine, pt usually goes to [facility]. Once done, please call pt back to keep her informed. Per Lina Sar, pt will call to schedule her [place] scan again after she completes her BW.

## 2023-08-13 NOTE — Telephone Encounter
Copied from CRM [id number]. Topic: General Inquiry - General Inquiry>> Aug 13, 2023 11:09 AM Ott Zimmerle M wrote:maggie charge nurse from [facility] nursing facility called regarding pt was schedule to have [place] scan was told pt need lab done prior to having [place] scan. Appt for [place] scan cancel Please call maggie at [phone]

## 2023-08-20 ENCOUNTER — Other Ambulatory Visit: Admit: 2023-08-20 | Payer: PRIVATE HEALTH INSURANCE | Attending: Nephrology | Primary: Nephrology

## 2023-08-20 DIAGNOSIS — G629 Polyneuropathy, unspecified: Secondary | ICD-10-CM

## 2023-08-20 DIAGNOSIS — D649 Anemia, unspecified: Secondary | ICD-10-CM

## 2023-08-20 DIAGNOSIS — M069 Rheumatoid arthritis, unspecified: Secondary | ICD-10-CM

## 2023-08-20 LAB — CBC WITH AUTO DIFFERENTIAL
BKR WAM ABSOLUTE IMMATURE GRANULOCYTES.: 0.03 x 1000/ÂµL (ref 0.00–0.30)
BKR WAM ABSOLUTE LYMPHOCYTE COUNT.: 1.14 x 1000/ÂµL (ref 0.60–3.70)
BKR WAM ABSOLUTE NRBC (2 DEC): 0 x 1000/ÂµL (ref 0.00–1.00)
BKR WAM ANC (ABSOLUTE NEUTROPHIL COUNT): 2.69 x 1000/ÂµL (ref 2.00–7.60)
BKR WAM BASOPHIL ABSOLUTE COUNT.: 0.05 x 1000/ÂµL (ref 0.00–1.00)
BKR WAM BASOPHILS: 1.1 % (ref 0.0–1.4)
BKR WAM EOSINOPHIL ABSOLUTE COUNT.: 0.38 x 1000/ÂµL (ref 0.00–1.00)
BKR WAM EOSINOPHILS: 8.1 % — ABNORMAL HIGH (ref 0.0–5.0)
BKR WAM HEMATOCRIT (2 DEC): 31.5 % — ABNORMAL LOW (ref 35.00–45.00)
BKR WAM HEMOGLOBIN: 9.4 g/dL — ABNORMAL LOW (ref 11.7–15.5)
BKR WAM IMMATURE GRANULOCYTES: 0.6 % (ref 0.0–1.0)
BKR WAM LYMPHOCYTES: 24.3 % (ref 17.0–50.0)
BKR WAM MCH (PG): 27.8 pg (ref 27.0–33.0)
BKR WAM MCHC: 29.8 g/dL — ABNORMAL LOW (ref 31.0–36.0)
BKR WAM MCV: 93.2 fL (ref 80.0–100.0)
BKR WAM MONOCYTE ABSOLUTE COUNT.: 0.4 x 1000/ÂµL (ref 0.00–1.00)
BKR WAM MONOCYTES: 8.5 % (ref 4.0–12.0)
BKR WAM MPV: 10.8 fL (ref 8.0–12.0)
BKR WAM NEUTROPHILS: 57.4 % — ABNORMAL LOW (ref 39.0–72.0)
BKR WAM NUCLEATED RED BLOOD CELLS: 0 % (ref 0.0–1.0)
BKR WAM PLATELETS: 281 x1000/ÂµL (ref 150–420)
BKR WAM RDW-CV: 17.3 % — ABNORMAL HIGH (ref 11.0–15.0)
BKR WAM RED BLOOD CELL COUNT.: 3.38 M/ÂµL — ABNORMAL LOW (ref 4.00–6.00)
BKR WAM WHITE BLOOD CELL COUNT: 4.7 x1000/ÂµL (ref 4.0–11.0)

## 2023-08-20 LAB — COMPREHENSIVE METABOLIC PANEL
BKR A/G RATIO: 0.6
BKR ALANINE AMINOTRANSFERASE (ALT): 12 U/L — ABNORMAL HIGH (ref 12–78)
BKR ALBUMIN: 3 g/dL — ABNORMAL LOW (ref 3.4–5.0)
BKR ALKALINE PHOSPHATASE: 80 U/L (ref 20–120)
BKR ANION GAP: 7 (ref 5–18)
BKR ASPARTATE AMINOTRANSFERASE (AST): 11 U/L (ref 5–37)
BKR AST/ALT RATIO: 0.9 x 1000/ÂµL (ref 0.60–3.70)
BKR BILIRUBIN TOTAL: 0.2 mg/dL (ref 0.0–1.0)
BKR BLOOD UREA NITROGEN: 13 mg/dL (ref 8–25)
BKR BUN / CREAT RATIO: 14 (ref 8.0–25.0)
BKR CALCIUM: 10.3 mg/dL (ref 8.4–10.3)
BKR CHLORIDE: 112 mmol/L (ref 95–115)
BKR CO2: 22 mmol/L (ref 21–32)
BKR CREATININE: 0.93 mg/dL (ref 0.50–1.30)
BKR EGFR, CREATININE (CKD-EPI 2021): >60 mL/min/{1.73_m2} (ref >=60)
BKR GLOBULIN: 5.1 g/dL
BKR GLUCOSE: 94 mg/dL (ref 70–100)
BKR OSMOLALITY CALCULATION: 281 mosm/kg (ref 275–295)
BKR POTASSIUM: 4.3 mmol/L (ref 3.5–5.1)
BKR PROTEIN TOTAL: 8.1 g/dL (ref 6.4–8.2)
BKR SODIUM: 141 mmol/L (ref 136–145)

## 2023-08-21 ENCOUNTER — Ambulatory Visit: Admit: 2023-08-21 | Payer: MEDICARE | Primary: Nephrology

## 2023-08-25 ENCOUNTER — Encounter: Admit: 2023-08-25 | Payer: PRIVATE HEALTH INSURANCE | Attending: Urology | Primary: Nephrology

## 2023-08-25 ENCOUNTER — Telehealth: Admit: 2023-08-25 | Payer: PRIVATE HEALTH INSURANCE | Attending: Urology | Primary: Nephrology

## 2023-08-25 ENCOUNTER — Ambulatory Visit: Admit: 2023-08-25 | Discharge: 2023-08-25 | Payer: MEDICARE | Primary: Nephrology

## 2023-08-25 NOTE — Telephone Encounter
-----   Message from [name], MD sent at 08/25/2023  2:46 PM EDT -----What happened to this patient she is postop a [place] scan and then an ov with me she does have an indwelling stent that is important

## 2023-08-25 NOTE — Telephone Encounter
Patient: Danielle [name]. called to Danielle appt -Patients imaging appt ran later today Okey Dupre will be calling to reschedule later this week) She was scheduled for today but patient Danielle and she will call to reschedule

## 2023-08-25 NOTE — Telephone Encounter
MD informed imaging ran late and nursing facility (care giver) will be calling to reschedule.  Routing to front desk for awareness important they reschedule in a timely manner.

## 2023-08-26 NOTE — Telephone Encounter
Call center /  front desk if Conservator or caregiver call back please make sure she has her [place] scan done prior (a few days before) she is schedule with Dr. Hulen Luster.

## 2023-08-26 NOTE — Telephone Encounter
9/4 - left message to conservator [facility]) - Good morning, I am calling from Dr. Enid Skeens just to let you know that Mrs. Collet appointment was cancelled yesterday by Germaine Pomfret and that per Dr. Hulen Luster its important for the patient to follow up with him, please give [place] a call to reschedule her appointment at [phone].Will call back tomorrow if I do not see patient in Dr. Kathie Rhodes schedule.

## 2023-08-27 ENCOUNTER — Encounter: Admit: 2023-08-27 | Payer: PRIVATE HEALTH INSURANCE | Primary: Nephrology

## 2023-08-28 ENCOUNTER — Telehealth: Admit: 2023-08-28 | Payer: PRIVATE HEALTH INSURANCE | Attending: Urology | Primary: Nephrology

## 2023-08-28 DIAGNOSIS — N399 Disorder of urinary system, unspecified: Secondary | ICD-10-CM

## 2023-08-28 DIAGNOSIS — N201 Calculus of ureter: Secondary | ICD-10-CM

## 2023-08-28 NOTE — Telephone Encounter
[place] pended, Patient no showed for appt

## 2023-08-28 NOTE — Telephone Encounter
Called and spoke with [facility], informed them to contact [place] scan scheduling, gave the the number [phone] to schedule,Order pended to MDPatient was a no show at [facility] 08[facility]25/[facility], called them and they are not sure why that happened.Also faxed over a note to [facility] to have the call and schedule [place], and then schedule o/v with MD fax [phone]

## 2023-08-28 NOTE — Telephone Encounter
I contacted patient's conservator - [name]-  I told her that I was calling from Dr. Hulen Luster and that per md its important for patient to follow up, but first pt needs to do [place] scan. I offered to scheduled both appointment and conservator told me that I need to call [facility], which I did and front desk transferred my call to the nurse station, the phone rings and nobody pick up the call. Please send certification letter.

## 2023-08-31 NOTE — Telephone Encounter
Rose (nurse) calling from her living facility has some questions regarding the [place] and the labs she might need. Also she states there are some issues around her getting the [place]. Danielle [name] is requesting to speak with a nurse as soon as possible. Rose can be reached at [phone] (her direct line)

## 2023-08-31 NOTE — Telephone Encounter
Message left for [facility] RN at [facility], Dr Hulen Luster office returning her call.

## 2023-09-01 NOTE — Telephone Encounter
Repeat message left for Danielle [name] at [facility], [place] scan noted to be scheduled for 9/25 Danielle [name] informed to reach out to radiology to see if blood work needs to be repeated prior. Please call the office with any questions or concerns.

## 2023-09-02 ENCOUNTER — Telehealth: Admit: 2023-09-02 | Payer: PRIVATE HEALTH INSURANCE | Attending: Urology | Primary: Nephrology

## 2023-09-02 NOTE — Telephone Encounter
Rose RN contacted states has been having issues with [place] procedures labs , IV .Marland KitchenMarland Kitchen She is scheduled for the main hospital 9/25 for her [place] Scan Okey Dupre will reach out to she if additional blood work is required.  Informed that per MD she is to make an appt 1 week after [place] in the office for cystoscopy stent removal.  Routing to the front desk to schedule.  Rose verbalized understanding.

## 2023-09-02 NOTE — Telephone Encounter
Copied from CRM #[id number]. Topic: General Inquiry - General Inquiry>> Sep 02, 2023 10:47 AM Rennis Chris B wrote:Rose called regarding missed call, would like a callback # [phone].

## 2023-09-02 NOTE — Telephone Encounter
-----   Message from [name], MD sent at 09/01/2023  5:51 PM EDT -----What is a story with this patient she she was to get a [place] scan abdomen pelvis with and without contrast then ovShe does have a retained stent that needs follow up

## 2023-09-02 NOTE — Telephone Encounter
Attempted to contact [facility] RN from [employer] [phone] (her direct line) Message left Dr Hulen Luster office calling to ensure all is set up for patients  scan very important and to please call the office back  to confirm.

## 2023-09-07 NOTE — Telephone Encounter
 Fyi - spoke with Rose and booked patient cystoscopy for 10/1

## 2023-09-11 ENCOUNTER — Other Ambulatory Visit: Admit: 2023-09-11 | Payer: MEDICARE | Attending: Nephrology | Primary: Nephrology

## 2023-09-11 DIAGNOSIS — E785 Hyperlipidemia, unspecified: Secondary | ICD-10-CM

## 2023-09-11 DIAGNOSIS — N390 Urinary tract infection, site not specified: Secondary | ICD-10-CM

## 2023-09-11 DIAGNOSIS — D649 Anemia, unspecified: Secondary | ICD-10-CM

## 2023-09-11 DIAGNOSIS — G629 Polyneuropathy, unspecified: Secondary | ICD-10-CM

## 2023-09-11 LAB — COMPREHENSIVE METABOLIC PANEL
BKR A/G RATIO: 0.6 % (ref 0.0–1.0)
BKR ALANINE AMINOTRANSFERASE (ALT): 8 U/L — ABNORMAL LOW (ref 12–78)
BKR ALKALINE PHOSPHATASE: 76 U/L — ABNORMAL HIGH (ref 20–120)
BKR ANION GAP: 2 — ABNORMAL LOW (ref 5–18)
BKR ASPARTATE AMINOTRANSFERASE (AST): 8 U/L (ref 5–37)
BKR AST/ALT RATIO: 1
BKR BILIRUBIN TOTAL: 0.2 mg/dL (ref 0.0–1.0)
BKR BUN / CREAT RATIO: 15.1 (ref 8.0–25.0)
BKR CHLORIDE: 113 mmol/L — ABNORMAL LOW (ref 95–115)
BKR CO2: 26 mmol/L (ref 21–32)
BKR CREATININE: 0.86 mg/dL — ABNORMAL LOW (ref 0.50–1.30)
BKR GLOBULIN: 4.9 g/dL
BKR GLUCOSE: 85 mg/dL (ref 70–100)
BKR OSMOLALITY CALCULATION: 281 mOsm/kg (ref 275–295)
BKR POTASSIUM: 4.2 mmol/L (ref 3.5–5.1)
BKR PROTEIN TOTAL: 7.7 g/dL (ref 6.4–8.2)
BKR SODIUM: 141 mmol/L (ref 136–145)
BKR WAM BASOPHILS: 8 U/L (ref 5–37)
BKR WAM HEMATOCRIT (2 DEC): 2 % — ABNORMAL LOW (ref 5–18)
BKR WAM LYMPHOCYTES: 0.2 mg/dL (ref 0.0–1.0)
BKR WAM PLATELETS: 15.1 x1000/ÂµL (ref 8.0–25.0)

## 2023-09-11 LAB — CBC WITH AUTO DIFFERENTIAL
BKR ALBUMIN: 50.2 % — ABNORMAL LOW (ref 39.0–72.0)
BKR BLOOD UREA NITROGEN: 27.3 pg (ref 27.0–33.0)
BKR CALCIUM: 15.6 % — ABNORMAL HIGH (ref 11.0–15.0)
BKR EGFR, CREATININE (CKD-EPI 2021): 0.35 x 1000/ÂµL (ref 0.00–1.00)
BKR WAM ABSOLUTE IMMATURE GRANULOCYTES.: 0.01 x 1000/ÂµL (ref 0.00–0.30)
BKR WAM ABSOLUTE LYMPHOCYTE COUNT.: 1.16 x 1000/ÂµL (ref 0.60–3.70)
BKR WAM ABSOLUTE NRBC (2 DEC): 0 x 1000/ÂµL (ref 0.00–1.00)
BKR WAM ANC (ABSOLUTE NEUTROPHIL COUNT): 2.25 x 1000/ÂµL (ref 2.00–7.60)
BKR WAM BASOPHIL ABSOLUTE COUNT.: 0.05 x 1000/ÂµL (ref 0.00–1.00)
BKR WAM EOSINOPHIL ABSOLUTE COUNT.: 0.35 x 1000/ÂµL (ref 0.00–1.00)
BKR WAM EOSINOPHILS: 7.8 % — ABNORMAL HIGH (ref 0.0–5.0)
BKR WAM HEMOGLOBIN: 8.1 g/dL — ABNORMAL LOW (ref 11.7–15.5)
BKR WAM IMMATURE GRANULOCYTES: 0.2 % (ref 0.0–1.0)
BKR WAM MCH (PG): 27.3 pg (ref 27.0–33.0)
BKR WAM MCHC: 29.9 g/dL — ABNORMAL LOW (ref 31.0–36.0)
BKR WAM MCV: 91.2 fL (ref 80.0–100.0)
BKR WAM MONOCYTE ABSOLUTE COUNT.: 0.67 x 1000/ÂµL (ref 0.00–1.00)
BKR WAM MONOCYTES: 14.9 % — ABNORMAL HIGH (ref 4.0–12.0)
BKR WAM MPV: 10.1 fL (ref 8.0–12.0)
BKR WAM NEUTROPHILS: 50.2 % (ref 39.0–72.0)
BKR WAM NUCLEATED RED BLOOD CELLS: 0 % (ref 0.0–1.0)
BKR WAM RDW-CV: 15.6 % — ABNORMAL HIGH (ref 11.0–15.0)
BKR WAM RED BLOOD CELL COUNT.: 2.97 M/ÂµL — ABNORMAL LOW (ref 4.00–6.00)
BKR WAM WHITE BLOOD CELL COUNT: 4.5 x1000/ÂµL (ref 4.0–11.0)

## 2023-09-11 NOTE — Telephone Encounter
 Patient is having [place] scan on 9/25 and cystoscopy with you on 10/1

## 2023-09-11 NOTE — Telephone Encounter
 Nurse closing encounter as md got wrong patient -  I send a staff message with the right patient, I will open a telephone encounter for md to confirm patient

## 2023-09-11 NOTE — Telephone Encounter
 Copied from CRM [id number]. Topic: General Message - [facility]>> Sep 11, 2023  3:23 PM wrote:[facility] MESSAGETime of call:   3:23 PMCaller:   Corporate investment banker for call:   Leona-Patients Sister called in stating that Mina Marble was not aware of the patients appointment and wanted our office to reach out to advise of the dates.  She stated we should speak with either Venita Sheffield or Maggie.  While author had her on the phone, Thereasa Parkin called Mina Marble on the other line and spoke with Venita Sheffield and advised her that the patient has 2 appointments, 1-[place] Scan on 9/25 @ [address] @ 1:20 pm and 2-Cystoscopy/Stent removal appointment with Dr. Hulen Luster on 09/22/23 @ 10:30 at [address].  Venita Sheffield then stated that she will speak with Rose to arrange for transportation.  That call was finished and Chartered loss adjuster then confirmed with Hillary Bow that Mina Marble now knows of the appointments.  Caller was very grateful and no further questions/concerns were expressed.

## 2023-09-11 NOTE — Telephone Encounter
 No kub order, please put one

## 2023-09-14 ENCOUNTER — Telehealth: Admit: 2023-09-14 | Payer: PRIVATE HEALTH INSURANCE | Attending: Medical Oncology | Primary: Nephrology

## 2023-09-14 ENCOUNTER — Emergency Department: Admit: 2023-09-14 | Payer: MEDICARE | Primary: Nephrology

## 2023-09-14 ENCOUNTER — Inpatient Hospital Stay
Admit: 2023-09-14 | Discharge: 2023-09-30 | Payer: MEDICARE | Attending: Student in an Organized Health Care Education/Training Program | Admitting: Internal Medicine

## 2023-09-14 ENCOUNTER — Telehealth: Admit: 2023-09-14 | Payer: PRIVATE HEALTH INSURANCE | Primary: Nephrology

## 2023-09-14 MED ORDER — ACETAMINOPHEN 650 MG RECTAL SUPPOSITORY
650 mg | Freq: Once | RECTAL | Status: CP
Start: 2023-09-14 — End: ?
  Administered 2023-09-15: 650 mg via RECTAL

## 2023-09-14 MED ORDER — SODIUM CHLORIDE 0.9 % BOLUS (NEW BAG)
0.9 % | Freq: Once | INTRAVENOUS | Status: CP
Start: 2023-09-14 — End: ?
  Administered 2023-09-15: 03:00:00 0.9 mL/h via INTRAVENOUS

## 2023-09-14 MED ORDER — IOHEXOL 350 MG IODINE/ML INTRAVENOUS SOLUTION
350 mg iodine/mL | Freq: Once | INTRAVENOUS | Status: CP | PRN
Start: 2023-09-14 — End: ?
  Administered 2023-09-15: 02:00:00 350 mL via INTRAVENOUS

## 2023-09-14 MED ORDER — SODIUM CHLORIDE 0.9 % BOLUS (NEW BAG)
0.9 % | Freq: Once | INTRAVENOUS | Status: CP
Start: 2023-09-14 — End: ?
  Administered 2023-09-15: 01:00:00 0.9 mL/h via INTRAVENOUS

## 2023-09-14 MED ORDER — VANCOMYCIN 750 MG IN 250 ML IVPB (VIALMATE)
INTRAVENOUS | Status: DC
Start: 2023-09-14 — End: 2023-09-16
  Administered 2023-09-15: 05:00:00 250.000 mL/h via INTRAVENOUS

## 2023-09-14 MED ORDER — PIPERACILLIN-TAZOBACTAM (ZOSYN) 3.375GM MBP
Freq: Once | INTRAVENOUS | Status: CP
Start: 2023-09-14 — End: ?
  Administered 2023-09-15: 03:00:00 50.000 mL/h via INTRAVENOUS

## 2023-09-14 NOTE — Telephone Encounter
 Follow up call to Lafonda Mosses, APRN; informed that Dr. Nedra Hai is in agreement that the anticoagulation should be stopped, instructed to stop the eliquis, last [place] in June was negative, Advised her to repeat the [place] in about four weeks from now, she will have been off the eliquis.  In the meantime, since our last conversation, Lafonda Mosses reports that on examination the patient's leg (forgot to state which one) is very hot to touch, but patient also has RA diagnosis and on ROM there is no leg pain, so she has ordered an [place] she will contact us if she needs further assistance.----- Message from Isaias Cowman, MD sent at 09/14/2023  2:58 PM EDT -----Regarding: RE: New informationI agree that the anticoagulation should be stopped.Please tell her to stop Eliquis.Her last ultrasound in June was negativeAdvise him to repeat the ultrasound in in about 4 weeks from now----- Message -----From: Edd Arbour, RNSent: 09/14/2023   2:48 PM EDTTo: Isaias Cowman, MDSubject: New information                              Received phone call from the APRN at [facility] with the following concerns;She is reaching out to advise the patient is still on Eliquis, she's done a fecal occult test positive x3 and her H&H is declining/lower, APRN wants to know if she can be taken of the anticoagulant as she is concerned about bleeding given recent testing.  Patient is on protonix and iron po.Lafonda Mosses is able to order US's  if you want and this is preferable vs having the patient leave the facility.Patient had repeat doppler US on 6/28, message was left for the brother to call the office to receive instructions/results.  Apparently, he didn't call or get message so none of that occurred.Please let me know how you want to proceed.Lafonda Mosses APRN phone  [phone]

## 2023-09-14 NOTE — Telephone Encounter
 Lafonda Mosses called and mentioned that she would like to speak to nurse regarding the results of duplex lower extremist venous.[phone]

## 2023-09-15 ENCOUNTER — Telehealth: Admit: 2023-09-15 | Payer: PRIVATE HEALTH INSURANCE | Attending: Urology | Primary: Nephrology

## 2023-09-15 LAB — CBC WITH AUTO DIFFERENTIAL
BKR WAM ABSOLUTE IMMATURE GRANULOCYTES.: 0.07 x 1000/ÂµL (ref 0.00–0.30)
BKR WAM ABSOLUTE LYMPHOCYTE COUNT.: 0.44 x 1000/ÂµL — ABNORMAL LOW (ref 0.60–3.70)
BKR WAM ABSOLUTE NRBC (2 DEC): 0 x 1000/ÂµL (ref 0.00–1.00)
BKR WAM ABSOLUTE NRBC (2 DEC): 0 x 1000/ÂµL (ref 0.00–1.00)
BKR WAM ANC (ABSOLUTE NEUTROPHIL COUNT): 11.23 x 1000/ÂµL — ABNORMAL HIGH (ref 2.00–7.60)
BKR WAM BASOPHIL ABSOLUTE COUNT.: 0.03 x 1000/ÂµL (ref 0.00–1.00)
BKR WAM BASOPHILS: 0.2 % (ref 0.0–1.4)
BKR WAM EOSINOPHIL ABSOLUTE COUNT.: 0.02 x 1000/ÂµL (ref 0.00–1.00)
BKR WAM EOSINOPHILS: 0.2 % (ref 0.0–5.0)
BKR WAM HEMATOCRIT (2 DEC): 30 % — ABNORMAL LOW (ref 35.00–45.00)
BKR WAM HEMATOCRIT (2 DEC): 26.2 % — ABNORMAL LOW (ref 35.00–45.00)
BKR WAM HEMOGLOBIN: 9.3 g/dL — ABNORMAL LOW (ref 11.7–15.5)
BKR WAM HEMOGLOBIN: 8.1 g/dL — ABNORMAL LOW (ref 11.7–15.5)
BKR WAM IMMATURE GRANULOCYTES: 0.5 % (ref 0.0–1.0)
BKR WAM LYMPHOCYTES: 3.4 % — ABNORMAL LOW (ref 17.0–50.0)
BKR WAM MCH (PG): 27.7 pg (ref 27.0–33.0)
BKR WAM MCH (PG): 27.7 pg (ref 27.0–33.0)
BKR WAM MCHC: 31 g/dL (ref 31.0–36.0)
BKR WAM MCHC: 30.9 g/dL — ABNORMAL LOW (ref 31.0–36.0)
BKR WAM MCV: 89.3 fL (ref 80.0–100.0)
BKR WAM MCV: 89.7 fL (ref 80.0–100.0)
BKR WAM MONOCYTE ABSOLUTE COUNT.: 0.97 x 1000/ÂµL (ref 0.00–1.00)
BKR WAM MONOCYTES: 7.6 % (ref 4.0–12.0)
BKR WAM MPV: 9.7 fL (ref 8.0–12.0)
BKR WAM MPV: 10.2 fL (ref 8.0–12.0)
BKR WAM NEUTROPHILS: 88.1 % — ABNORMAL HIGH (ref 39.0–72.0)
BKR WAM NUCLEATED RED BLOOD CELLS: 0 % (ref 0.0–1.0)
BKR WAM NUCLEATED RED BLOOD CELLS: 0 % (ref 0.0–1.0)
BKR WAM PLATELETS: 314 x1000/ÂµL (ref 150–420)
BKR WAM PLATELETS: 263 x1000/ÂµL (ref 150–420)
BKR WAM RDW-CV: 15.8 % — ABNORMAL HIGH (ref 11.0–15.0)
BKR WAM RDW-CV: 15.8 % — ABNORMAL HIGH (ref 11.0–15.0)
BKR WAM RED BLOOD CELL COUNT.: 3.36 M/ÂµL — ABNORMAL LOW (ref 4.00–6.00)
BKR WAM RED BLOOD CELL COUNT.: 2.92 M/ÂµL — ABNORMAL LOW (ref 4.00–6.00)
BKR WAM WHITE BLOOD CELL COUNT: 12.7 x1000/ÂµL — ABNORMAL HIGH (ref 4.0–11.0)
BKR WAM WHITE BLOOD CELL COUNT: 12.8 x1000/ÂµL — ABNORMAL HIGH (ref 4.0–11.0)

## 2023-09-15 LAB — URINALYSIS WITH CULTURE REFLEX      (BH LMW YH)
BKR BILIRUBIN, UA: NEGATIVE
BKR GLUCOSE, UA: NEGATIVE
BKR KETONES, UA: NEGATIVE
BKR NITRITE, UA: POSITIVE — AB
BKR PH, UA: 6.5 (ref 5.5–7.5)
BKR SPECIFIC GRAVITY, UA: 1.019 (ref 1.005–1.030)
BKR UROBILINOGEN, UA (MG/DL): <2 mg/dL (ref <=2.0)

## 2023-09-15 LAB — MANUAL DIFFERENTIAL
BKR WAM BANDS (DIFF) (1 DEC): 2.6 % (ref 0.0–10.0)
BKR WAM BASOPHIL - ABS (DIFF) 2 DEC: 0 x 1000/ÂµL (ref 0.00–1.00)
BKR WAM BASOPHILS (DIFF): 0 % (ref 0.0–1.4)
BKR WAM EOSINOPHILS (DIFF) 2 DEC: 0 x 1000/ÂµL (ref 0.00–1.00)
BKR WAM EOSINOPHILS (DIFF): 0 % (ref 0.0–5.0)
BKR WAM LYMPHOCYTE - ABS (DIFF) 2 DEC: 1.75 x 1000/ÂµL (ref 0.60–3.70)
BKR WAM LYMPHOCYTES (DIFF): 13.8 % — ABNORMAL LOW (ref 17.0–50.0)
BKR WAM MONOCYTE - ABS (DIFF) 2 DEC: 0.55 x 1000/ÂµL (ref 0.00–1.00)
BKR WAM MONOCYTES (DIFF): 4.3 % (ref 4.0–12.0)
BKR WAM NEUTROPHILS (DIFF): 79.3 % — ABNORMAL HIGH (ref 39.0–72.0)
BKR WAM NEUTROPHILS - ABS (DIFF) 2 DEC: 10.41 x 1000/ÂµL — ABNORMAL HIGH (ref 2.00–7.60)

## 2023-09-15 LAB — COMPREHENSIVE METABOLIC PANEL
BKR A/G RATIO: 0.5
BKR ALANINE AMINOTRANSFERASE (ALT): 7 U/L — ABNORMAL LOW (ref 12–78)
BKR ALBUMIN: 2.8 g/dL — ABNORMAL LOW (ref 3.4–5.0)
BKR ALKALINE PHOSPHATASE: 75 U/L (ref 20–120)
BKR ANION GAP: 6 (ref 5–18)
BKR ASPARTATE AMINOTRANSFERASE (AST): 14 U/L (ref 5–37)
BKR AST/ALT RATIO: 2
BKR BILIRUBIN TOTAL: 0.4 mg/dL (ref 0.0–1.0)
BKR BLOOD UREA NITROGEN: 16 mg/dL (ref 8–25)
BKR BUN / CREAT RATIO: 11.3 (ref 8.0–25.0)
BKR CALCIUM: 10.3 mg/dL (ref 8.4–10.3)
BKR CHLORIDE: 106 mmol/L (ref 95–115)
BKR CO2: 22 mmol/L (ref 21–32)
BKR CREATININE: 1.42 mg/dL — ABNORMAL HIGH (ref 0.50–1.30)
BKR EGFR, CREATININE (CKD-EPI 2021): 40 mL/min/{1.73_m2} — ABNORMAL LOW (ref >=60)
BKR GLOBULIN: 6.1 g/dL
BKR GLUCOSE: 117 mg/dL — ABNORMAL HIGH (ref 70–100)
BKR OSMOLALITY CALCULATION: 270 mosm/kg — ABNORMAL LOW (ref 275–295)
BKR POTASSIUM: 4.4 mmol/L (ref 3.5–5.1)
BKR PROTEIN TOTAL: 8.9 g/dL — ABNORMAL HIGH (ref 6.4–8.2)
BKR SODIUM: 134 mmol/L — ABNORMAL LOW (ref 136–145)

## 2023-09-15 LAB — LACTIC ACID, PLASMA (REFLEX 2H REPEAT)
BKR LACTATE: 2 mmol/L (ref 0.4–2.0)
BKR LACTATE: 1.4 mmol/L (ref 0.4–2.0)

## 2023-09-15 LAB — ANAEROBIC BLOOD CULTURE IDENTIFICATION PCR (LAB ORDERABLE ONLY) (YH)
BKR CTX-M PCR: DETECTED — AB
BKR ESCHERICHIA COLI PCR: DETECTED — AB
BKR KPC PCR: NOT DETECTED

## 2023-09-15 LAB — BASIC METABOLIC PANEL
BKR ANION GAP: 7 (ref 5–18)
BKR BLOOD UREA NITROGEN: 13 mg/dL (ref 8–25)
BKR BUN / CREAT RATIO: 11.2 (ref 8.0–25.0)
BKR CALCIUM: 9.1 mg/dL (ref 8.4–10.3)
BKR CHLORIDE: 112 mmol/L (ref 95–115)
BKR CO2: 19 mmol/L — ABNORMAL LOW (ref 21–32)
BKR CREATININE: 1.16 mg/dL (ref 0.50–1.30)
BKR EGFR, CREATININE (CKD-EPI 2021): 51 mL/min/{1.73_m2} — ABNORMAL LOW (ref >=60)
BKR GLUCOSE: 105 mg/dL — ABNORMAL HIGH (ref 70–100)
BKR OSMOLALITY CALCULATION: 276 mosm/kg (ref 275–295)
BKR POTASSIUM: 3.7 mmol/L (ref 3.5–5.1)
BKR SODIUM: 138 mmol/L (ref 136–145)

## 2023-09-15 LAB — SARS-COV-2 (COVID-19)/INFLUENZA A+B/RSV BY RT-PCR (BH GH LMW YH)
BKR INFLUENZA A: NEGATIVE
BKR INFLUENZA B: NEGATIVE
BKR RESPIRATORY SYNCYTIAL VIRUS: NEGATIVE
BKR SARS-COV-2 RNA (COVID-19) (YH): NEGATIVE

## 2023-09-15 LAB — PROCALCITONIN     (BH GH LMW Q YH): BKR PROCALCITONIN: 1.02 ng/mL — ABNORMAL HIGH

## 2023-09-15 LAB — PROTIME AND INR
BKR INR: 1.21 — ABNORMAL HIGH (ref 0.88–1.14)
BKR PROTHROMBIN TIME: 12.7 s — ABNORMAL HIGH (ref 9.4–12.0)

## 2023-09-15 LAB — URINE MICROSCOPIC     (BH GH LMW YH)
BKR RBC/HPF INSTRUMENT: 54 /HPF — ABNORMAL HIGH (ref 0–2)
BKR URINE SQUAMOUS EPITHELIAL CELLS, UA (NUMERIC): 2 /HPF (ref 0–5)
BKR WBC/HPF INSTRUMENT: 1206 /HPF — ABNORMAL HIGH (ref 0–5)

## 2023-09-15 LAB — UA REFLEX CULTURE

## 2023-09-15 MED ORDER — LACTATED RINGERS IV BOLUS (NEW BAG)
Freq: Once | INTRAVENOUS | Status: DC
Start: 2023-09-15 — End: 2023-09-16

## 2023-09-15 MED ORDER — SODIUM CHLORIDE 0.9 % BOLUS (NEW BAG)
0.9 | Freq: Once | INTRAVENOUS | Status: DC
Start: 2023-09-15 — End: 2023-09-15

## 2023-09-15 MED ORDER — ERTAPENEM 1000 MG MBP
INTRAVENOUS | Status: CP
Start: 2023-09-15 — End: ?
  Administered 2023-09-15 – 2023-09-25 (×11): 50.000 mL/h via INTRAVENOUS

## 2023-09-15 MED ORDER — LACTATED RINGERS IV BOLUS (NEW BAG)
Freq: Once | INTRAVENOUS | Status: CP
Start: 2023-09-15 — End: ?
  Administered 2023-09-16: 02:00:00 1000.000 mL/h via INTRAVENOUS

## 2023-09-15 MED ORDER — SODIUM CHLORIDE 0.9 % BOLUS (NEW BAG)
0.9 | Freq: Once | INTRAVENOUS | Status: CP
Start: 2023-09-15 — End: ?
  Administered 2023-09-15: 05:00:00 0.9 mL/h via INTRAVENOUS

## 2023-09-15 MED ORDER — ACETAMINOPHEN 325 MG TABLET
325 | Freq: Three times a day (TID) | ORAL | Status: CP | PRN
Start: 2023-09-15 — End: ?
  Administered 2023-09-15 – 2023-09-26 (×6): 325 mg via ORAL

## 2023-09-15 MED ORDER — APIXABAN 5 MG TABLET
5 | Freq: Two times a day (BID) | ORAL | Status: CP
Start: 2023-09-15 — End: ?
  Administered 2023-09-15 – 2023-09-20 (×12): 5 mg via ORAL

## 2023-09-15 MED ORDER — FERROUS GLUCONATE 324 MG (38 MG IRON) TABLET
324 | ORAL | Status: CP
Start: 2023-09-15 — End: ?
  Administered 2023-09-15 – 2023-09-29 (×8): 324 mg (38 mg iron) via ORAL

## 2023-09-15 MED ORDER — ZZ IMS TEMPLATE
Freq: Two times a day (BID) | ORAL | Status: CP
Start: 2023-09-15 — End: ?
  Administered 2023-09-15 – 2023-09-30 (×29): 400 mg via ORAL

## 2023-09-15 MED ORDER — LACTATED RINGERS IV BOLUS FROM BAG
INTRAVENOUS | Status: CP
Start: 2023-09-15 — End: ?
  Administered 2023-09-15: 13:00:00 1000.000 mL/h via INTRAVENOUS

## 2023-09-15 MED ORDER — FOLIC ACID 1 MG TABLET
1 | Freq: Every day | ORAL | Status: CP
Start: 2023-09-15 — End: ?
  Administered 2023-09-15 – 2023-09-30 (×16): 1 mg via ORAL

## 2023-09-15 MED ORDER — SODIUM CHLORIDE 0.9 % (FLUSH) INJECTION SYRINGE
0.9 | Freq: Three times a day (TID) | INTRAVENOUS | Status: AC
Start: 2023-09-15 — End: ?
  Administered 2023-09-15 – 2023-09-30 (×12): 0.9 mL via INTRAVENOUS

## 2023-09-15 MED ORDER — FAMOTIDINE 20 MG TABLET
20 | Freq: Every day | ORAL | Status: CP
Start: 2023-09-15 — End: ?
  Administered 2023-09-15 – 2023-09-30 (×16): 20 mg via ORAL

## 2023-09-15 MED ORDER — LACTATED RINGERS INTRAVENOUS SOLUTION
INTRAVENOUS | Status: AC
Start: 2023-09-15 — End: ?
  Administered 2023-09-15 (×2): 1000.000 mL/h via INTRAVENOUS

## 2023-09-15 MED ORDER — SODIUM CHLORIDE 0.9 % (FLUSH) INJECTION SYRINGE
0.9 | INTRAVENOUS | Status: AC | PRN
Start: 2023-09-15 — End: ?

## 2023-09-15 MED ORDER — ROSUVASTATIN 10 MG TABLET
10 | Freq: Every evening | ORAL | Status: CP
Start: 2023-09-15 — End: ?
  Administered 2023-09-16 – 2023-09-30 (×15): 10 mg via ORAL

## 2023-09-15 MED ORDER — PIPERACILLIN-TAZOBACTAM (ZOSYN) 3.375GM MBP
Freq: Three times a day (TID) | INTRAVENOUS | Status: DC
Start: 2023-09-15 — End: 2023-09-15
  Administered 2023-09-15: 11:00:00 50.000 mL/h via INTRAVENOUS

## 2023-09-15 NOTE — H&P
 History and Physical ExaminationDate of Service: 9/24/2024PatientKAARI [name]: 1957/10/24MRN#: [name] CSN: [id number] Attending: Ponciano [name], MDAdmit Date: 9/23/2024Primary Care Physician: Danielle [name] XCHIEF COMPLAINT:lethargyHISTORY OF PRESENT ILLNESS: This is a 67 y.o. 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 The patient presented to [facility] emergency department from SNF with concern for lethargy and less interactive than baseline, patient found to be febrile in the ED and hospital medicine was contacted for hospital admission. 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 (HC Code) (HC CODE) (HC Code)   Dysphagia   Encephalopathy   ESBL (extended spectrum beta-lactamase) producing bacteria infection   GERD (gastroesophageal reflux disease)   Hypercalcemia   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 She has never used smokeless tobacco. She reports that she does not currently use alcohol. She reports that she does not use drugs. FAMILY HISTORYNo family history on file.ALLERGIESPatient has no known allergies.MEDICATIONS PRIOR TO ADMISSION:The medication reconciliation is a dynamic process. To the best of the writer's knowledge, these were the medications the patient was on prior to admission but may be subject to change as more information becomes available.Current Outpatient Medications Medication Instructions  acetaminophen (TYLENOL) 975 mg, Oral, EVERY 6 HOURS PRN  apixaban (ELIQUIS) 5 mg, Per G Tube, EVERY 12 HOURS  ferrous gluconate (FERGON) 324 mg, Oral, Every Other Day  folic acid (FOLVITE) 1 mg, Oral, Daily  gabapentin (NEURONTIN) 400 mg, Oral, 2 Times Daily With Breakfast and Dinner  gabapentin (NEURONTIN) 600 mg, Oral, Nightly  hydroxychloroquine (PLAQUENIL) 200 mg, Oral, Daily  magnesium hydroxide (MILK OF MAGNESIA) 400 mg/5 mL suspension 30 mLs, Oral, DAILY PRN  melatonin 3 mg, Oral, Nightly  pantoprazole (PROTONIX) 40 mg, Oral, Daily  polyethylene glycol (MIRALAX) 17 g, Oral, Daily, Mix in 8 ounces of water, juice, soda, coffee or tea prior to taking.  potassium citrate (UROCIT-K) 15 mEq extended release tablet 15 mEq, Oral, 2 Times Daily Scheduled  rosuvastatin (CRESTOR) 10 mg, Per G Tube, Nightly  traMADoL (ULTRAM) 50 mg, Oral, EVERY 8 HOURS PRN REVIEW OF SYSTEMS - Unobtainable secondary to encephalopathyPHYSICAL EXAMCONSTITUTIONAL:BP  Min: 103/71  Max: 110/70Temp  Min: 99.9 ?F (37.7 ?C)  Max: 102.9 ?F (39.4 ?C)Pulse  Min: 79  Max: 93Resp  Min: 16  Max: 17SpO2  Min: 96 %  Max: 99 %Weight  Min: 58 kg  Max: 58 kgGENERAL APPEARANCE: No acute distress.PSYCH: Mood is pleasant, no acute agitation, does not appear to be reacting to internal  stimuliNEURO: Alert and oriented to self, no seizure like activity observedHENT: poor dentition, dry oral mucosa, Neck ROM WNL, trachea midline, Eyelids symmetrical bilaterallyCARDIOVASCULAR: normal heart rate, no lower extremity edemaGASTROINTESTINAL: Abdomen is not tender, not distended, no palpable abdominal masses, ostomy present, midline surgical scar well healed and bandage of left upper quadrant without bleeding or discharge visualized on or near bandageRESPIRATORY: No accessory muscle use, no wheezing, regular respiratory rate, not utilizing supplemental oxygenINTEGUMENTARY: No jaundice, No acute bleeding wounds on exposed skinGU: No suprapubic tendernessMUSCULOSKELETAL: all upper extremities are symmetrical, soft and non tender. all lower extremities are symmetrical, soft and non tender.The most recent physician documentation in Epic was personally reviewed.Laboratory data and diagnostic imaging have been personally reviewed including but not limited to the information below.LABORATORY & IMAGING DATARecent Labs Lab 09/20/240655 09/23/242029 WBC 4.5 12.7* HGB 8.1* 9.3* HCT 27.10* 30.00* PLT 314 314 No results for input(s): CKTOTAL, TROPONINI, TROPONINT, CKMBINDEX, BNP in the last 168 hours.Recent Labs Lab 09/20/240655 09/23/242029 NA 141 134* K 4.2 4.4 CL 113 106 CO2 26 22 BUN 13 16 CREATININE 0.86 1.42* CALCIUM 10.1 10.3 PROT 7.7 8.9* BILITOT 0.2 0.4 ALKPHOS 76 75 ALT 8* 7* AST 8 14 Recent Labs Lab 09/23/242029 INR 1.21* Lab Results Component Value Date  ALT 7 (L) 09/14/2023  AST 14 09/14/2023  ALKPHOS 75 09/14/2023  BILITOT 0.4 09/14/2023  No results found for: ALBUA:Lab Results Component Value Date  COLORU Orange (A) 09/14/2023  CLARITYU Turbid (A) 09/14/2023  LABPH 6.5 07/03/2023  GLUCOSEU Negative 09/14/2023  BLOODU 2+ (A) 09/14/2023  NITRITE Positive (A) 09/14/2023  BACTERIA Many (A) 09/14/2023 Radiology: [facility] Chest Abdomen Pelvis w IV ContrastResult Date: 9/24/2024PROCEDURE: [place] scan of the chest, abdomen and pelvis with intravenous contrast (axial sections with sagittal and coronal reformats) September 14, 2023 at 2212 hours Clinical History: Sepsis Comparison: July 17, 2023 Findings: There is subsegmental dependent atelectasis at the lung bases. There is no pleural effusion or pneumothorax. The thoracic aorta demonstrates atheromatous calcification without evidence of aneurysm or dissection. No evidence of mediastinal mass or lymphadenopathy. There is no pericardial effusion. A small hiatal hernia is present. There is moderate left hydroureteronephrosis with nephroureteral stent catheter in place. There is no calculi along the course of the stent.  There is urothelial thickening and enhancement of the left renal pelvis and left proximal ureter with asymmetrical left perinephric fat stranding and fluid. There is a 3 mm nonobstructing left renal calculus at the lower pole calyx. There are multiple left renal cortical cysts, the largest measures 1 cm. There is also nonobstructing right renal calculus at the upper pole calyx measuring 7 mm. Multiple calculi are noted within the gallbladder, without evidence of gallbladder wall thickening or pericholecystic fluid. There are subcentimeter hypodensities  in the right lobe of the liver, which may represent a cyst. The spleen, pancreas and adrenals are unremarkable on this noncontrast study. There is transverse colon colostomy in the left paraumbilical region. There is ileostomy in the right lower quadrant.  There are multiple colonic diverticula without evidence of diverticulitis.  No evidence of bowel obstruction. Abundant fecal material is noted in the rectum. The urinary bladder is partially distended and demonstrates diffuse wall thickening with subtle perivesical fat stranding. There are multiple calcified uterine fibroids, the largest measures 3.6 x 3.2 cm. No evidence of adnexal mass. There is no free fluid or free air. Degenerative changes are identified in the spine. There is dextroscoliosis of thoracic spine. 1. Findings suggestive of acute pyeloureteritis and cystitis. Moderate  left hydroureteronephrosis with nephroureteral stent catheter in place, malfunction of the left nephroureteral stent or vesicoureteral reflux cannot be excluded. 2. Status post colostomy in the left paraumbilical region and ileostomy in the right lower quadrant. No evidence of bowel obstruction, free air or abscess. 3. Posterior dependent atelectasis in the lower lobes, which may be related to sequelae of aspiration or infectious etiology. 4. Other findings as described above. Report Discussion Details: Results verbally communicated to : Marla Roe, Nurse practitioner at 12:10 AM 09/15/2023 Date of Order: 09/14/2023 10:20:17 PM Report Electronically Signed By: Nicanor Alcon, MD Signature Date/Time: 09/15/2023 12:12:56 AMCT Head wo IV ContrastResult Date: 9/23/2024PROCEDURE: [place] scan of the head without intravenous contrast (axial sections with sagittal and coronal reformats) September 14, 2023 2212 hours Clinical history: Mental status change, unknown cause No prior study is available for comparison. Findings: No evidence of intracranial hemorrhage, mass effect or midline shift. There is volume loss. The lateral ventricles appear disproportionately enlarged in comparison to the sulci. The calvarium is unremarkable. The mastoid air cells and the visualized paranasal sinuses are clear. No intracranial hemorrhage, mass effect or midline shift. Volume loss with slightly disproportionate enlargement of the ventricles as compared to the sulci. In the appropriate clinical setting, the possibility of normal pressure hydrocephalus cannot be excluded. Recommend clinical correlation and followup with MRI as indicated. Date of Order: 09/14/2023 10:20:07 PM Report Electronically Signed By: Candy Sledge, MD Signature Date/Time: 09/14/2023 11:33:51 PMXR Chest PA or APResult Date: 9/23/2024AP CHEST: 09/14/2023. CLINICAL HISTORY: Malaise, Sepsis. COMPARISON: Chest radiograph 06/26/2023 FINDINGS: Lines/Tubes/Devices: None. Lungs: No focal consolidation. No evidence of pulmonary edema. Pleura: No pleural effusion. No pneumothorax. Cardiomediastinal silhouette: Normal. Bones/soft tissues: Dextroscoliosis. Upper Abdomen: Unremarkable. Limited AP view- No acute abnormality. [facility] Radiology Notify System Classification: Routine. Reported and signed by:  Lonia Chimera, MD  ASSESSMENT AND PLANSevere sepsis Acute pyeloureteritis Acute cystitis AKISevere sepsis diagnosis supported by leukocytosis, fever, GU source and AKI (serum cr 1.42 at presentation  (0.86 on 09/11/2023).The patient has left ureteral stent placed in July 2024 The patients presenting concern was for lethargy and decreased interactive that was concerning for acute on chronic encephalopathy (patient has conservator)Patient is answering some questions appropriately and is lethargic as expected in the setting of infection and lethargy is not distinctly different than reported baseline especially in the middle of night. Primary intracranial process was excluded by [place] head and patients presentation is not suggestive  with cns infection. Admit to inpatientVancomycin and zosyn are ordered empirically Follow up culture data Trend WBCPrn antipyretics Strict I and OHistory of DVTcurrent use of systemic anticoagulationPatient is on eliquis for DVT and is reported to have heme positive stool without associated transfusion dependent anemia suggestive of acute uncontrolled GI bleedRAPresentation not consistent with acute RA flare, no hot tender joints on exam. Patient is on plaquenil which is held at admissionChronic back painChronic peripheral neuropathyPatient is on gabapentin 400mg  BID and 600 mg additional QHS per facility med list. Given concern for lethargy 400mg  BID will be ordered at admission.GERD without esophagitisPoor dentition Chronic, stable, no reports of dysphagia or aspiration events. Patient has history of gastrostomy tube which is not present at admission, bandage is at site of prior G tube on left abdominal wallPatient has multiple missing teeth and chewing food likely very difficulty, given patients lethargic state diet will be ordered as dysphagia diet (facility reports patient has regular diet)HOB elevated Protonix continued at admissionOstomy presentPatient has translucent ostomy appliance with minimal stool present, unclear when last emptying was Routine  ostomy careMixed HyperlipidemiaChronicContinue statin therapyContinue to monitorDVT: apixibanCode Status: full code by default and prior documentationNo family or visitors at bedside for this encounter.Danielle Ort, MD, [facility] Medicine

## 2023-09-15 NOTE — Significant Event
 Called to evaluate pt for hypotension 67 yo woman with a prior history of septic shock from uti with stone requiring stent admitted with uti possible pyeloS/p 2L bp was sbp 70sLactate normal Getting zosyn Has history of esblWhen I rounded on pt bp have been 90s/60s, normal pulse 70s, 02 98, asymptomatic, making urine Chagne abx to erta to cover for esblContinue fluids as no longer hypotensiveNo icu needs at this time If worsens feel free to reach out

## 2023-09-15 NOTE — Other
 Nursing Skin/Wound ConsultA wound consult was placed on [employer],  a 67 y.o. female.  Wound care consulted . Chart reviewed. Bedisde visit mad w ketnel Angrand RN. Total care patient. Subjective: Alert . Disoriented. Problems  Hospital Problems Multidisciplinary Problems Principal Problem:  Sepsis, due to unspecified organism, unspecified whether acute organ dysfunction present (HC Code) (HC CODE) (HC Code)  SNOMED [place](R): SEPSIS   Active Multi-Disciplinary problems:Diabetes Comorbidity (09/14/23)Violence Risk or Actual (09/14/23)Adult Inpatient Plan of Care (09/15/23)Fall Injury Risk (09/15/23)Wound Healing Progression (09/15/23)Skin Injury Risk Increased (09/15/23) Allergies No Known Allergies Objective: Nutrition: Nutrition Risk Screen: reduced oral intake over the last month Functional Status: total care                              Continence: Voiding Characteristics: urethral catheter (bladder)                     GI Signs/Symptoms: stool pattern regular, other (see comments) (colostomy)Braden Risk AssessmentSensory Perception: 3-->slightly limitedMoisture: 3-->occasionally moistActivity: 1-->bedfastMobility: 2-->very limitedNutrition: 2-->probably inadequateFriction and Shear: 1-->problemBraden Score: 12Albumin Date Value Ref Range Status 09/14/2023 2.8 (L) 3.4 - 5.0 g/dL Final 95/18/8416 2.8 (L) 3.4 - 5.0 g/dL Final Prealbumin Date Value Ref Range Status 02/17/2022 7.7 (L) 20.0 - 40.0 mg/dL Final  Physical Exam: Total care patient.  Foley in place. Max assist 2 to turn. Unable to maintain position. Scarred tissue to buttocks indicative of healed skin injury.  Skin intact at this assessment. Pt is at great risk d/t previous injury and comorbidities. LDA outcomed. Recommendations:   SAcrum:Barier cream 2 x a day and prn Nursing InterventionsHeel(s): elevated off resting surface with pillow or protective devices, (elevated on pillow - place pillow vertically under legIncontinence/moisture management care: dimethicone/zinc barrier for moisture management to buttock and coccyx, avoid too many underpads, use moisture wicking underpads, use soft wash clothes,Positioning: with pillows or wedges, HOB elevated no more than 30 degrees (unless contraindicated), turn and reposition every 2 hours (unless contraindicated); pressure redistribution/relief mattress, weight shifts in chair, chair cushion when oob, monitor pressure points for pressure and shearing, monitor tubes and devices for pressure points, pad as neededInstructions/Patient Education: Give patient/family general education on prevention and treatment of skin alteration, instruct to stay off of affected area, instruct in need to turn and position q2hrs, and give educational hand out on discharge.CommunicationFindings and recommendations communicated to RNFindings and recommendations communicated to MD.Signed:Zahari Fazzino Natividad Brood, RN9/24/20241:30 PM

## 2023-09-15 NOTE — Other
 Patient with left pyelo  HPICrystal A Freitas is a 67 y.o. female  status post bilateral stent placement for urosepsisPatient had passed a right ureteral stone has not asymptomatic left ureteral stonePatient more alert todayUrine blood-tinged07/07/2023 patient looks reasonably well with a clear understanding what is going onPatient with Klebsiella and urine of presses urine blood-tinged improving\WBC down to 7.9 from 22 creatinine 0.757/11/2023 sp bilateral stent passed stone r uretr has left midOn ceftin sepsis PAR for urs on left remove stent rightUa wbc  09/15/2023 patient is status post bilateral ureteral obstruction urgent bilateral stent placedPatient did have right ureteral stone Past had a difficult ureteroscopy on the left side secondary to 4 mm stone in the mid proximal ureterCurrently has stent placed since middle of JulyAdmitted for fever sepsisHopefully during this hospitalization will require ureteroscopy probably next week and recommendations to followHPI Past Medical HistoryPast Medical History    Past Medical History: Diagnosis Date  Acute embolism and thrombosis of unspecified femoral vein (HC Code)    Anemia    Cellulitis and abscess of right lower extremity    Dysphagia    Encephalopathy    GERD (gastroesophageal reflux disease)    Hypercalcemia    Paraplegia (HC Code)    Rheumatoid arthritis (HC Code)     Past Surgical HistoryPast Surgical History     Past Surgical History: Procedure Laterality Date  BAND HEMORRHOIDECTOMY      CYSTECTOMY   1972  LAPAROSCOPIC SALPINGOOPHERECTOMY   1986  LAPAROTOMY   2022  PARTIAL COLECTOMY   2022  PEG TUBE PLACEMENT   01/2022  UPPER GASTROINTESTINAL ENDOSCOPY       AllergiesNo Known [email or web address] Social HistorySocial History     Tobacco Use  Smoking status: Never  Smokeless tobacco: Never  Family History No family history on file. Review of SystemsNo symptoms beyond those noted in HPI and PMH- all other systems are negative Physical ExamBP 103/66  - Pulse 78  - Temp 99.8 ?F (37.7 ?C) (Temporal)  - Resp 20  - Ht 5' 10 (1.778 m)  - Wt 65.6 kg  - SpO2 95%  - BMI 20.75 kg/m?      Lab Results Component Value Date   WBC 7.9 06/29/2023   HGB 9.1 (L) 06/29/2023   HCT 28.80 (L) 06/29/2023   PLT 176 06/29/2023   ALT 8 (L) 06/29/2023   AST 9 06/29/2023   NA 144 06/29/2023   K 3.2 (L) 06/29/2023   CL 116 (H) 06/29/2023   CREATININE 0.75 06/29/2023   BUN 9 06/29/2023   CO2 23 06/29/2023   TSH 0.527 03/09/2023   INR 1.08 06/28/2023   GLU 88 06/29/2023   HGBA1C 5.5 03/09/2023   No results found for: UCOLOR, UGLUCOSE, UKETONE, USPECGRAVITY, UPH, UPROTEIN, UNITRATES, UBLOOD, ULEUKOCYTES, UA  No results found for: PVRPOCT     Previous resultsNo results found for: TESTOSTERONE  AUA Symptom Score today:       No data to display        PATHOLOGY AND BIOPSIES   TEST RESULTS   Images     DISCUSSION       FL OR Urography Retrograde Result Date: 06/27/2023 Intraoperative fluoroscopy was utilized for bilateral retrograde injections and bilateral ureteral stent placements. Reported and signed by:  Carolan Clines, MD  [place] Abdomen Pelvis wo IV Contrast (No Oral)  (Renal Stone) Result Date: 06/26/2023 1. Moderate obstruction of the right kidney from a 6, right UVJ calculus. 2. Nonobstructing renal calculi. 3. Gallstones with trace pericholecystic  fluid could be secondary to the adjacent inflammatory change. Cholecystitis cannot be entirely excluded. 4. Postoperative changes with extensive colonic diverticulosis. Reported and signed by:  Carolan Clines, MD  XR Chest PA or AP Result Date: 7/5/2024Limited AP view- No acute abnormality. [facility] Radiology Notify System Classification: Routine. Reported and signed by:  Mirian Mo  US Duplex Lower Extremity Venous Bilat Result Date: 06/19/2023 No DVT of the right or left lower extremity. [facility] Radiology Notify System Classification: Routine. Reported and signed by:  Lonia Chimera, MD  [place] Abdomen Pelvis w IV Contrast (No Oral) Result Date: 05/24/2023  1. Extensive colonic diverticulosis without [place] evidence for diverticulitis. No definite contrast extravasation is seen to identify a source of bleeding. 2. Nonobstructing right renal calculi. Delayed left nephrogram with a new 5 mm calculus projecting at the level of the left iliac crest highly suspicious for a left ureteral calculus Dr. Alena Bills discussed these findings with [name], PA at 2:56 AM. I discussed these findings with Dr. Rinaldo Ratel at 9:30 AM. Radiology Notify System Classification: Clinical team aware. Individual dose optimization technique was used for the performance of this procedure, and one or more of the following dose reduction techniques was used: Automated exposure control and/or adjustment of the MA and/or kV according to patient size and/or the use of iterative reconstruction technique. Reported and signed by:  Geroge Baseman, MD  CXR portable Result Date: 03/09/2023  No active cardiopulmonary abnormalities.  No change. [facility]: Routine. Reported and signed by: Amie Critchley, MD    Assessment & Plan: DEIANIRA CLETO is a 67 y.o. left pyelonephritis excellent stent placement stone is not visualizedMaybe passHopefully this hospitalization early next week we can consider ureteroscopy see if the stone is still there and removal of stent P

## 2023-09-15 NOTE — Telephone Encounter
 Writer spoke with Danielle [name] regarding Vanessia A MaloneCrystal A Mathe was admitted to [facility] yesterday.[name] has a cat scan scheduled, then a cystoscopy next week.Danielle [name] is unsure if Ifeoma A Galmore will be able to make the appointments

## 2023-09-15 NOTE — Progress Notes
 [facility] Medicine Progress NoteAttending Provider: Les Pou, MD Subjective                                                                              Subjective: Interim History: - Blood pressure 86/49.  LR fluid bolus ordered. - Patient seen at bedside.  She is sitting up, she is alert and oriented to [facility].  She denies pain but complains of burning in her feet which she states is not new.  Denies chest pain or dyspnea.  Review of Allergies/Meds/Hx: Review of Allergies/Meds/Hx:I have reviewed the patient's: allergies, current scheduled medications, current infusions, current prn medications, past medical history, and prior to admission medications Objective Objective: Vitals:Last 24 hours: Temp:  [98.4 ?F (36.9 ?C)-102.9 ?F (39.4 ?C)] 99 ?F (37.2 ?C)Pulse:  [72-104] 81Resp:  [16-23] 19BP: (86-111)/(49-76) 86/49SpO2:  [94 %-99 %] 95 %I/O's:Gross Totals (Last 24 hours) at 09/15/2023 0829Last data filed at 09/15/2023 0631Intake 500 ml Output 200 ml Net 300 ml Procedures:None Physical Exam Alert oriented to self and [place] [facility] RRR Lungs CTABAbd soft, +ostomy empty Ext warm Labs:Last 8 hours: Recent Results (from the past 8 hour(s)) Basic metabolic panel  Collection Time: 09/15/23  4:43 AM Result Value Ref Range  Sodium 138 136 - 145 mmol/L  Potassium 3.7 3.5 - 5.1 mmol/L  Chloride 112 95 - 115 mmol/L  CO2 19 (L) 21 - 32 mmol/L  Anion Gap 7 5 - 18  Glucose 105 (H) 70 - 100 mg/dL  BUN 13 8 - 25 mg/dL  Creatinine 8.29 5.62 - 1.30 mg/dL  Calcium 9.1 8.4 - 13.0 mg/dL  BUN/Creatinine Ratio 86.5 8.0 - 25.0  Osmolality Calculation 276 275 - 295 mOsm/kg  eGFR (Creatinine) 51 (L) >=60 mL/min/1.35m2 CBC auto differential  Collection Time: 09/15/23  4:43 AM Result Value Ref Range  WBC 12.8 (H) 4.0 - 11.0 x1000/?L  RBC 2.92 (L) 4.00 - 6.00 M/?L Hemoglobin 8.1 (L) 11.7 - 15.5 g/dL  Hematocrit 78.46 (L) 96.29 - 45.00 %  MCV 89.7 80.0 - 100.0 fL  MCH 27.7 27.0 - 33.0 pg  MCHC 30.9 (L) 31.0 - 36.0 g/dL  RDW-CV 52.8 (H) 41.3 - 15.0 %  Platelets 263 150 - 420 x1000/?L  MPV 10.2 8.0 - 12.0 fL  Neutrophils 88.1 (H) 39.0 - 72.0 %  Lymphocytes 3.4 (L) 17.0 - 50.0 %  Monocytes 7.6 4.0 - 12.0 %  Eosinophils 0.2 0.0 - 5.0 %  Basophil 0.2 0.0 - 1.4 %  Immature Granulocytes 0.5 0.0 - 1.0 %  nRBC 0.0 0.0 - 1.0 %  Absolute Lymphocyte Count 0.44 (L) 0.60 - 3.70 x 1000/?L  Monocyte Absolute Count 0.97 0.00 - 1.00 x 1000/?L  Eosinophil Absolute Count 0.02 0.00 - 1.00 x 1000/?L  Basophil Absolute Count 0.03 0.00 - 1.00 x 1000/?L  Absolute Immature Granulocyte Count 0.07 0.00 - 0.30 x 1000/?L  Absolute nRBC 0.00 0.00 - 1.00 x 1000/?L  ANC (Abs Neutrophil Count) 11.23 (H) 2.00 - 7.60 x 1000/?L Diagnostics:9/23 [place] chest/abdomen/pelvis with IV contrast 1. Moderate left hydroureteronephrosis, increased compared to prior exam, with nephroureteral stent again in place. Urothelial thickening/enhancement and perinephric fat stranding may represent ascending infection.2. Stable bilateral nonobstructing renal stones. 3.  No acute findings in the chest.9/23 [place] head 1. Cerebral and cerebellar atrophy. 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 Clinical correlation is recommended.2. Nonspecific white matter lucencies are most likely related to chronic small vessel ischemic changes.3. No [place] evidence of acute intracranial abnormality.ECG/Tele Events: NSR Assessment and Plan: 67 yo female with cognitive impairment, hx DVT, RA, complicated urologic history, recent admission 7/5-7/10 for septic shock 2/2 UTI and obstructive stone s/p placement of bilateral ureteral stent with subsequent removal of right stent on 7/25 with re-insertion of left ureteral stent complicated by left ureteral injury admitted with sepsis from UTI. # Sepsis secondary to acute pyeloureteritis - Concerned that patient hypotensive this AM after receiving total  2L fluid and antibiotics. - Ordered additional bolus .  Delay with hanging but getting IVF  now. - Repeat lactate pending. - Have asked ICU to evaluate patient. - Have reached out to Urology - per Dr. Blondell Reveal stent looks okay, recommending foley.  Order placed. - Continue vancomycin and zosyn. - Follow cultures. - Strict I/os. # Hx DVT - Continue eliquis. # Presence of ostomy - Routine ostomy car . Other issues stable Hx RAHx peripheral neuropathy - gabapentin dose reduced. DVT prophy - apixaban  Electronically Signed:Junah Yam Baldo Daub, MD Bee[place] 26709/24/2024, 8:20 AM

## 2023-09-15 NOTE — Other
 ANTIMICROBIAL SURVEILLANCE NOTE: Positive Blood Culture ResultsCurrent Therapy: Antimicrobial #1: ertapenemIndication: sepsis due to pyelonephritisPertinent Cultures:Culture #1: (9/23) Blood: Gram negative rods (1/2 bottles)PCR Result:  Escherichia coli (CTX-M gene detected)Suggestions/Comments:Detection of the CTX-M gene indicates an extended spectrum beta-lactamase (ESBL) producing isolateTherefore, would recommend continuing ertapenem pending final sensitivities For any questions, please contact: Alycia Patten, PharmD, BCIDPPhone/Mobile Heartbeat/Pager: [phone]

## 2023-09-15 NOTE — Telephone Encounter
 Copied from CRM #[id number]. Topic: General Inquiry - General Inquiry>> Sep 15, 2023 11:28 AM Dena Billet wrote:Rose (Nurse) called regarding patient's current inpatient status at [facility]. She states patient was admitted last night (9/23). You may contact Rose at [phone]

## 2023-09-15 NOTE — ED Provider Notes
 Emergency Department	Chief Complaint:Chief Complaint Patient presents with  Altered Mental Status   [facility], last well known 4 days ago. +guaiac per facility. Left sided facial droop first noted 4 days ago. Pt alert and oriented at baseline, today alert non-verbal. Hx paraplegia  AVW:UJWJXBJ Danielle [name], 67 y.o., female, presents with complaint of fever. Patient brought in by ambulance from the [facility] for mental status change over the last 4 days and positive guaiac according to the facility.  The patient's sister stated she was concerned about Danielle possible facial droop 4 days ago but the sister has not see the patient over the last 4 days.  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  Patient was taken from the [facility] nursing home shortly thereafter and brought by family to nursing home to [place] and within Danielle week was admitted to [facility].  Prior to her surgery the patient was alert and oriented and ambulatory.  Throughout her prolonged hospital stay which was complicated by bilateral DVTs and presumed metabolic encephalopathy, the patient has not been able to walk since 2022.In July 2024 at this hospital the patient had urosepsis and was in intensive care on pressors.  According to Urology, urosepsis likely secondary to Danielle passed right ureteral calculi.  Patient was found to have left mid ureteral calculi and had stent placed by Dr. Hulen Luster with plans to follow up soon with Danielle [place] and blood work for additional procedure according to chart review.02/11/22: [facility] admission:Pt is Danielle 67 y.o. 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 Patient was living in [place] when she was admitted on November 27th with perforated diverticulum and large volume pneumoperitoneum. Hospital course was complicated by delirium, TPN followed by PEG placement, bilateral DVTs. Patient was discharged on 02/15 to [facility]. Patient brought to [place] by sister who was unable to care for her at home, brought to the hospital for long-term care placement. Patient subsequently had an approximately 76mo long hospital stay and is now still bed bound at local SNF [facility]. Last known well per family 4 days ago. Patient is on Eliquis.Historian: Patient Review of Systems:Constitution: 	As per HPIENT:		No report of congestion/sneezingEYES:		Negative for vision changesCARDIO:	Negative for chest pain, palpitations RESP:		Negative cough or SOBGI:		Negative n/v/dGU:		Negative known dysuria/hematuriaMUSC:		Negative for muscle aches, joint painSKIN:		Negative for rashNEURO:	Negative for headachePast Medical History:Past Medical History: Diagnosis Date  Acute embolism and thrombosis of unspecified femoral vein (HC Code)   Adult failure to thrive   Anemia   Cellulitis and abscess of right lower extremity   Diverticulitis   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 (HC Code) (HC CODE) (HC Code)   Dysphagia   Encephalopathy   ESBL (extended spectrum beta-lactamase) producing bacteria infection   GERD (gastroesophageal reflux disease)   Hypercalcemia   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 CODE) (HC Code)   Incontinent of urine   Paraplegia (HC Code)   Rheumatoid arthritis (HC Code)  Past Surgical History: Procedure Laterality Date  BAND HEMORRHOIDECTOMY    CYSTECTOMY  1972  ILEOSTOMY    LAPAROSCOPIC SALPINGOOPHERECTOMY  1986  LAPAROTOMY  2022  PARTIAL COLECTOMY  2022  PEG TUBE PLACEMENT  01/2022  UPPER GASTROINTESTINAL ENDOSCOPY   Social History:Social History Tobacco Use  Smoking status: Never  Smokeless tobacco: Never Vaping Use  Vaping Use: Never used Substance Use Topics  Alcohol use: Not Currently  Drug use: Never Family History:No family history on file.Medications: Medication List  ASK your doctor about these medications  acetaminophen 325 mg tabletCommonly known as: TYLENOLTake 3 tablets (975 mg total) by mouth every 6 (six) hours as needed for pain. apixaban 5 mg Tab tabletCommonly known as: ELIQUIS ferrous gluconate 324 mg (38 mg iron) tabletCommonly known as: FERGONTake 1 tablet (324 mg total) by mouth every other day. folic acid 1 mg tabletCommonly known as: FOLVITETake 1 tablet (1 mg total) by mouth daily. * gabapentin 600 mg tabletCommonly known as: NEURONTIN * gabapentin 400 mg capsuleCommonly known as: NEURONTIN hydroxychloroquine 200 mg tabletCommonly known as: PLAQUENILTake 1 tablet (200 mg total) by mouth daily. magnesium hydroxide 400 mg/5 mL suspensionCommonly known as: MILK OF MAGNESIA melatonin 3 mg tabletTake 1 tablet (3 mg total) by mouth nightly. pantoprazole 40 mg tabletCommonly known as: PROTONIXTake 1 tablet (40 mg total) by mouth daily. polyethylene glycol 17 gram packetCommonly known as: MIRALAXTake 1 packet (17 g total) by mouth daily. Mix in 8 ounces of water, juice, soda, coffee or tea prior to taking. potassium citrate 15 mEq extended release tabletCommonly known as: UROCIT-KTake 1 tablet (15 mEq total) by mouth 2 (two) times daily. rosuvastatin 10 mg tabletCommonly known as: CRESTOR traMADoL 50 mg tabletCommonly known as: ULTRAM * This list has 2 medication(s) that are the same as other medications prescribed for you. Read the directions carefully, and ask your doctor or other care provider to review them with you.    Allergies as of 09/14/2023  (No Known Allergies) Physical Exam:  BP (!) 93/51  - Pulse (!) 104  - Temp 99.9 ?F (37.7 ?C) (Temporal)  - Resp 20  - Ht 5' 5 (1.651 m)  - Wt 58 kg  - SpO2 94%  - BMI 21.28 kg/m? 	General Appear:	Lethargic and appears ill, at times answers questions appropriately, febrileEars/Nose/Throat:	clearEyes:			PERRL, no dischargeNeck:			supple, FROM, no adenopathy, no meningismusCardiovascular:	Sinus tachycardiaRespiratory:		Clear to auscultationBack: 			no CVA tendernessAbdomen:		Soft, non-distended, non-tenderMusculoskeletal:	no edemaSkin:			dry, no rashNeurologic:		alert and orientedMedical Decision Making:Nursing notes were reviewed: yesI reviewed the following historical information: medical recordsOxygen saturation interpretation: 96% on RALaboratory studies: yesRadiologic Imaging studies:  [place] ED Chest Abdomen Pelvis w IV Contrast Preliminary Result   1. Findings suggestive of acute pyeloureteritis and cystitis. Moderate left hydroureteronephrosis with nephroureteral stent catheter in place, malfunction of the left nephroureteral stent or vesicoureteral reflux cannot be excluded.  2. Status post colostomy in the left paraumbilical region and ileostomy in the right lower quadrant. No evidence of bowel obstruction, free air or abscess.  3. Posterior dependent atelectasis in the lower lobes, which may be related to sequelae of aspiration or infectious etiology.  4. Other findings as described above.      Report Discussion Details:  Results verbally communicated to : Marla Roe, Nurse practitioner at 12:10 AM 09/15/2023   Date of Order: 09/14/2023 10:20:17 PM Report Electronically Signed By: Nicanor Alcon, MD Signature Date/Time: 09/15/2023 12:12:56 AM  [place] Head wo IV Contrast Preliminary Result   No intracranial hemorrhage, mass effect or midline shift.  Volume loss with slightly disproportionate enlargement of the ventricles as compared to the sulci. In the appropriate clinical setting, the possibility of normal pressure hydrocephalus  cannot be excluded. Recommend clinical correlation and followup with MRI as indicated.     Date of Order: 09/14/2023 10:20:07 PM Report Electronically Signed By: Candy Sledge, MD Signature Date/Time: 09/14/2023 11:33:51 PM  XR Chest PA or AP Final Result Limited AP view- No acute abnormality.  [facility] Radiology Notify System Classification: Routine.  Reported and signed by:  Lonia Chimera, MD    Independently interpreted by ED provider: labsDiscussed patient with another provider: yes, ED attending, hospitalist, and urologist Dr. Towana Badger.ED Course/Assessment/Plan:Medical Decision MakingLethargic, febrile, chronically ill appearing 67 year old female brought in by ambulance from skilled nursing facility [facility] for 4 days of altered mental status and report of positive guaiac test.  It is unclear why the patient was not sent to the hospital earlier.  The patient's sister called to discuss the case and reports that the patient was in her usual state of health 4 days ago which is alert and oriented and taking care of herself and the nursing facility eating and drinking normally.Patient has Danielle complex recent past medical history which began with Danielle perforated diverticulitis which was repaired in [place] in 2022.  Since this time, the patient has been in and out of hospitals with Danielle series of infections and change in mental status including Danielle 6 month hospital stay at [facility].  Patient has not been able to walk or Foley care for herself since her initial hospitalization in 2022.On exam, the patient is lethargic and will at times respond to questions answering them appropriately denying pain stating her name and date of birth.  Guaiac-positive.  Urine positive for infection.Sepsis alert.[place] concerning for acute cystitis and pyelonephritis with new left hydronephrosis.  High level of suspicion for infected left ureteral calculi and or left ureteral stent which was placed by Dr. Hulen Luster at the end of July 2024.Case discussed briefly over the phone with on-call urologist Dr. Towana Badger who agrees with admission.  Will admit to medicine given complex medical history.  Urology will follow the patient today with likely intervention.Prior to transfer to the step-down unit the patient received 2 L of normal saline and vancomycin/Zosyn.JYN:[id number] ureteral calculiInfected ureteral stentAmount and/or Complexity of Data ReviewedLabs: ordered.Radiology: ordered.ECG/medicine tests: ordered.RiskOTC drugs.Prescription drug management.Decision regarding hospitalization.______________________________________________________________________Condition: 	Jackquline [place]: 	AdmitDiagnosis: 	Sepsis, concern for infected uretal calculi/stent Marla Roe, APRN09/24/24 0205

## 2023-09-15 NOTE — Utilization Review (ED)
 Payor:  Medicare  Synopsis:   67 YO - Altered Mental Status, Sepsis, WBC - 12.7Temp 102.9Computed Tomography of Abdomen & Pelvis Chest, Head, Radiograph: ChestBlood Count, Metabolic Panel, Urine Analysis, Urine Culture, HS-TnT, T&S, Intravenous Fluid Normal Saline - 3LIntravenous Vancocin, Piperacillin-Tazobactam, UM Comments:   UM Status: Meets Inpatient Status     -    TLeotis Shames - Utilization Review Nurse09/24/24

## 2023-09-15 NOTE — Plan of Care
 Problem: Adult Inpatient Plan of CareGoal: Plan of Care ReviewOutcome: Interventions implemented as appropriateGoal: Patient-Specific Goal (Individualized)Outcome: Interventions implemented as appropriateGoal: Absence of Hospital-Acquired Illness or InjuryOutcome: Interventions implemented as appropriateGoal: Optimal Comfort and WellbeingOutcome: Interventions implemented as appropriateGoal: Readiness for Transition of CareOutcome: Interventions implemented as appropriate Admission Note Nursing Danielle [name] is a 67 y.o. female admitted with a chief complaint of A.M.S. Patient arrived from Arrived From: long-term carePatient is  Orientation: disoriented to;place;time;situation Vitals:  09/15/23 0019 09/15/23 0119 09/15/23 0241 09/15/23 0439 BP: (!) 111/55 (!) 93/51 (!) 92/56 96/60 Pulse: (!) 92 (!) 104 80 72 Resp: (!) 21 20 (!) 23 19 Temp:   (!) 100.9 ?F (38.3 ?C) 98.4 ?F (36.9 ?C) TempSrc:   Oral Oral SpO2: 96% 94% 94% 95% Weight:     Height:     Oxygen therapy Oxygen TherapySpO2: 95 %Device (Oxygen Therapy): room airCurrent Facility-Administered Medications Medication Dose Route Frequency Provider Last Rate Last Admin  piperacillin-tazobactam (ZOSYN) 3.375 g in sodium chloride 0.9% 50 mL (mini-bag plus)  3.375 g Intravenous Q8H Hallac, Alexander, MD      sodium chloride 0.9 % flush 3 mL  3 mL IV Push Q8H Hallac, Alexander, MD   3 mL at 09/15/23 0527  vancomycin (VANCOCIN) 750 mg in sodium chloride 0.9% 250 mL IVPB (vialmate)  750 mg Intravenous Q36H Marla Roe, APRN   Stopped at 09/15/23 0211  .I have reviewed patient valuables Belongings charted in last 7 days:  Comments:See flowsheets, patient education and plan of care for additional information. Plan of Care Overview/ Patient Status    Assumed care 0300am-0700amPt alert to self, disoriented x3. Febrile. Dr. Arsenio Loader made aware. Temp=100.9 PRN tylenol per MAR. Tele, SR. HR=70s . Room air. Lungs sound diminished. Colostomy bag noted, patent loose stool. Incontinent care performed. Call bell within reach. Bed alarm on. All medication and care explained. Purposeful and safety round precaution maintained. [name], RN [phone]: Temp=98.4Wound consult ordered for buttocks wound. Report given to nurse [name],RNBP 96/60  - Pulse 72  - Temp 98.4 ?F (36.9 ?C) (Oral)  - Resp 19  - Ht 5' 5 (1.651 m)  - Wt 58 kg  - SpO2 95%  - BMI 21.28 kg/m?

## 2023-09-16 ENCOUNTER — Telehealth: Admit: 2023-09-16 | Payer: PRIVATE HEALTH INSURANCE | Attending: Urology | Primary: Nephrology

## 2023-09-16 ENCOUNTER — Inpatient Hospital Stay: Admit: 2023-09-16 | Payer: PRIVATE HEALTH INSURANCE | Admitting: Urology | Primary: Nephrology

## 2023-09-16 ENCOUNTER — Inpatient Hospital Stay: Admit: 2023-09-16 | Discharge: 2023-09-16 | Payer: PRIVATE HEALTH INSURANCE | Primary: Nephrology

## 2023-09-16 LAB — CBC WITH AUTO DIFFERENTIAL
BKR WAM ABSOLUTE IMMATURE GRANULOCYTES.: 0.02 x 1000/ÂµL (ref 0.00–0.30)
BKR WAM ABSOLUTE LYMPHOCYTE COUNT.: 0.78 x 1000/ÂµL (ref 0.60–3.70)
BKR WAM ABSOLUTE NRBC (2 DEC): 0 x 1000/ÂµL (ref 0.00–1.00)
BKR WAM ANC (ABSOLUTE NEUTROPHIL COUNT): 4.54 x 1000/ÂµL (ref 2.00–7.60)
BKR WAM BASOPHIL ABSOLUTE COUNT.: 0.03 x 1000/ÂµL (ref 0.00–1.00)
BKR WAM BASOPHILS: 0.5 % (ref 0.0–1.4)
BKR WAM EOSINOPHIL ABSOLUTE COUNT.: 0.32 x 1000/ÂµL (ref 0.00–1.00)
BKR WAM EOSINOPHILS: 4.9 % (ref 0.0–5.0)
BKR WAM HEMATOCRIT (2 DEC): 22.6 % — ABNORMAL LOW (ref 35.00–45.00)
BKR WAM HEMOGLOBIN: 7 g/dL — ABNORMAL LOW (ref 11.7–15.5)
BKR WAM IMMATURE GRANULOCYTES: 0.3 % (ref 0.0–1.0)
BKR WAM LYMPHOCYTES: 12 % — ABNORMAL LOW (ref 17.0–50.0)
BKR WAM MCH (PG): 28 pg (ref 27.0–33.0)
BKR WAM MCHC: 31 g/dL (ref 31.0–36.0)
BKR WAM MCV: 90.4 fL (ref 80.0–100.0)
BKR WAM MONOCYTE ABSOLUTE COUNT.: 0.81 x 1000/ÂµL (ref 0.00–1.00)
BKR WAM MONOCYTES: 12.5 % — ABNORMAL HIGH (ref 4.0–12.0)
BKR WAM MPV: 9.7 fL (ref 8.0–12.0)
BKR WAM NEUTROPHILS: 69.8 % (ref 39.0–72.0)
BKR WAM NUCLEATED RED BLOOD CELLS: 0 % (ref 0.0–1.0)
BKR WAM PLATELETS: 243 x1000/ÂµL (ref 150–420)
BKR WAM RDW-CV: 15.7 % — ABNORMAL HIGH (ref 11.0–15.0)
BKR WAM RED BLOOD CELL COUNT.: 2.5 M/ÂµL — ABNORMAL LOW (ref 4.00–6.00)
BKR WAM WHITE BLOOD CELL COUNT: 6.5 x1000/ÂµL (ref 4.0–11.0)

## 2023-09-16 LAB — COMPREHENSIVE METABOLIC PANEL
BKR A/G RATIO: 0.4
BKR ALANINE AMINOTRANSFERASE (ALT): 19 U/L (ref 12–78)
BKR ALBUMIN: 2.1 g/dL — ABNORMAL LOW (ref 3.4–5.0)
BKR ALKALINE PHOSPHATASE: 67 U/L (ref 20–120)
BKR ANION GAP: 4 — ABNORMAL LOW (ref 5–18)
BKR ASPARTATE AMINOTRANSFERASE (AST): 28 U/L (ref 5–37)
BKR AST/ALT RATIO: 1.5
BKR BILIRUBIN TOTAL: 0.2 mg/dL (ref 0.0–1.0)
BKR BLOOD UREA NITROGEN: 10 mg/dL (ref 8–25)
BKR BUN / CREAT RATIO: 10.1 (ref 8.0–25.0)
BKR CALCIUM: 9.6 mg/dL (ref 8.4–10.3)
BKR CHLORIDE: 114 mmol/L (ref 95–115)
BKR CO2: 24 mmol/L (ref 21–32)
BKR CREATININE: 0.99 mg/dL (ref 0.50–1.30)
BKR EGFR, CREATININE (CKD-EPI 2021): >60 mL/min/{1.73_m2} (ref >=60)
BKR GLOBULIN: 4.9 g/dL
BKR GLUCOSE: 92 mg/dL (ref 70–100)
BKR OSMOLALITY CALCULATION: 282 mosm/kg (ref 275–295)
BKR POTASSIUM: 3.5 mmol/L (ref 3.5–5.1)
BKR PROTEIN TOTAL: 7 g/dL (ref 6.4–8.2)
BKR SODIUM: 142 mmol/L (ref 136–145)

## 2023-09-16 MED ORDER — LACTATED RINGERS IV BOLUS (NEW BAG)
Freq: Once | INTRAVENOUS | Status: CP
Start: 2023-09-16 — End: ?
  Administered 2023-09-16: 14:00:00 1000.000 mL/h via INTRAVENOUS

## 2023-09-16 NOTE — Progress Notes
 Had discussion with patient concerning issues patient has a left indwelling stent possible secondary stoneIs on the schedule for next Thursday for ureteroscopy stent exchange destruction of stonesPlease clear patient during this hospitalization to make her mobility allowed easierIf she is in the hospital till next week we will do this on Thursday

## 2023-09-16 NOTE — Plan of Care
 Plan of Care Overview/ Patient Status    Nutrition Initial Assessment:Pt admitted from SNF with lethargy and fever. Found to have sepsis r/t UTI. Waxing and waning mental status. This am was able to tolerated breakfast well, fed herself with fair to good appetite. RN reports pt's tolerance likely depends on her mental status. Will provide with oral supplement to help ensure needs being met. PMHx: Anemia, Cellulitis and abscess of right lower extremity, Diverticulitis, Dysphagia, GERD, Hypercalcemia, Paraplegia, Rheumatoid arthritis, partial colectomy 2/2 diverticulitisDiet History: regular diet per SNF recordsFood Allergies: NKFA'sSkin: no wounds, trace edemaBowels/GI: BM today via colostomyPain: none reportedMedications include: Current Facility-Administered Medications Medication  apixaban  ertapenem  famotidine  ferrous gluconate  folic acid  gabapentin  rosuvastatin  sodium chloride Nutrition Related Labs include:   Chemistry  Lab Results Component Value Date  NA 142 09/16/2023  K 3.5 09/16/2023  CL 114 09/16/2023  CO2 24 09/16/2023  BUN 10 09/16/2023  CREATININE 0.99 09/16/2023  GLU 92 09/16/2023  Lab Results Component Value Date  CALCIUM 9.6 09/16/2023  ALKPHOS 67 09/16/2023  AST 28 09/16/2023  ALT 19 09/16/2023  BILITOT 0.2 09/16/2023   Results in Past 7 DaysResult Component Current Result Hematocrit 22.60 (L) (09/16/2023) Hemoglobin 7.0 (L) (09/16/2023) MCH 28.0 (09/16/2023) MCHC 31.0 (09/16/2023) MCV 90.4 (09/16/2023) MPV 9.7 (09/16/2023) Platelets 243 (09/16/2023) RBC 2.50 (L) (09/16/2023) WBC 6.5 (09/16/2023) Lab Results Component Value Date  HGBA1C 5.5 03/09/2023  Nutritional AssessmentTimepoint: Assessment Patient Reported Diet/Restrictions/Preferences: regularCurrent StateCurrent Appetite: fairSpecialty Diet/Nutrition Received: softDiet/Feeding Assistance: assisted with feedingDiet/Feeding Tolerance: goodNutrition Interventions: diet adjustment recommendedNutrition Risk Screen: reduced oral intake over the last monthAnthropometric MeasurementsHeight: 5' 5 (165.1 cm) Weight: 58 kg      BMI (Calculated): 21.3     Weight history: Wt: 09/14/23 58 kg 07/16/23 58 kg 07/03/23 63.4 kg 07/01/23 63.4 kg 06/15/23 59.4 kg 05/24/23 62.2 kg  Estimated Nutritional NeedsTotal Energy Estimated Needs: 1750 kcal (30 kcal/kg) Total Protein Estimated Needs: 60-70 gm (1-1.2 gm/kg) Total Fluid Estimated Needs: 1750 ml (1 ml/kcal) Nutrition OrderNutrition Order: meets nutritional requirementsNutrition RiskDate of Last RD visit: 09/25/24Date of next RD visit: 09/23/23 (f/u)Nutrition Risk: patient at nutritional riskLevel of Risk: moderateNutrition DiagnosisNutrition Diagnosis/Problem: Increased nutrient needsRelated to: increased metabolic demandsAs Evidenced by: sepsis.Malnutrition Diagnosis - Adult: At RiskRecommendations:Continue soft/chopped diet. Consider SLP eval.Ensure Enlive BID, encourage intake between meals.Assist with meal setup and intake. Weekly weight. Interventions:Case reviewed.Supplement ordered. RD to follow clinical course and adjust nutrition plan as needed. Monitoring / Evaluation:Skin integrity - Goal: intactWeight - Goal: stable during admissionEnergy intake - Goal: >/= 75% estimated needs via oral intakeRD will continue to follow clinical course per protocol and adjust nutrition plan as appropriateElectronically Signed by Albertina Parr, MS RD CDN CNSC

## 2023-09-16 NOTE — Plan of Care
 Plan of Care Overview/ Patient Status    SOCIAL WORK ASSESSMENTPatient Name: Danielle Ayres MaloneMedical Record Number: [name] Date of Birth: 01-18-1957Medical Social Work Assessment Adult  Flowsheet Row Most Recent Value Rendered Accommodations (Leave blank if none rendered or patient/family supplied their own hearing devices/glasses)  Other language interpreter used (non-ASL)? No Admission Information  Document Type Clinical Assessment - Able to Assess (For Inpatient/ED Only) Prior psychosocial assessment has been documented within this hospitalization No Reason for Current Social Work Involvement Support/Coping Source of Information Patient Record Reviewed Yes Level of Care Inpatient What medium(s) of communication were used with patient/family/caregiver? Face-to-Face / In-Person Relationships  Marital Status Single Adult Lives With Alone  [Lives at [facility] term] Family circumstances Danielle [name] reports that she has been living at [facility] for the past 7 months, and that her sister is her conservator. Informal Supports (family, friends, church, etc) Danielle [name] reports that she has 6 siblings and that they visit and calls her often. Formal Supports (current community resources and providers) Lives at [facility] for the past 7 months. Pertinent spiritual or cultural factors None reported Relationship Comments Supportive Abuse Screen (yes response referral indicated)  Able to respond to abuse questions No Physical Indicators of Abuse No evidence of physical abuse Language needed None, Patient Speaks English Literacy Read/write independently Special Needs None reported Social Determinants of Health  Financial Concerns None Vocational and employment status and history Lives at [facility] for 7 months and retired from working in [employer] What is your living situation today? I have a steady place to live Think about the place you live. Do you have problems with any of the following? None In the past 12 months has the electric, gas, oil, or water company threatened to shut off services in your home? No Within the past 12 months, you worried that your food would run out before you got the money to buy more. Never true Within the past 12 months, the food you bought just didn't last and you didn't have money to get more. Never true In the past 12 months, has lack of transportation kept you from medical appointments or from getting medications? No In the past 12 months, has lack of transportation kept you from meetings, work, or from getting things needed for daily living? No Housing/Transportation/Environmental Comment (use soup kitchens, food pantries, eviction pending, unable to afford) Danielle [name] reports that for the past 7 month she has been living at [facility] and that her sister is her conservator. She reports that she would like to change her sister from being her conservator. She is not sure how she would like to proceed with who her new conservator should be. She is considering allowing the court to choose a conservator for her. Mental Status  Mental Status Able to Assess Appearance appears stated age Attitude/Demeanor/Rapport appropriate to circumstances Affect (typically observed) accepting Orientation disoriented to situation Insight Fair Health Insight/Judgment lacks insight into situation Reaction to Event/Health Status Adjusting Suicide Risk Assessment  Reason for Assessment Utilizing SAFE-T and C-SSRS (Check all that apply) Social Work Consult/Assessment C-SSRS Able to Assess Screening for suicidal ideation within Past Month In the past month have you wished you were dead or wished you could go to sleep and not wake up? no In the past month have you actually had any thoughts of killing yourself? no Have you ever done anything, started to do anything, or prepared to do anything to end your life? no Preparation Comment None reported Past  3 months Comment None reported [place] Suicide Risk Level low risk Specific Questions about Thoughts, Plans, Suicidal Intent (SAFE-T) Negative responses above do not indicate a need for SAFE-T assessment Risk Assessment  Access to Firearms? No Concern for patient utilizing another lethal method of self-harm, suicide, or harm to others No Risk Assessment Able to Assess Risk to Self Able to Assess Risk to Self - Self-Injurious Behavior None identified Attitudes regarding Self-Injury None disclosed Imminent Risk for Self-Injury in Community Low Imminent Risk for Self-Injury in Facility Low Risk to Others Able to Assess Risk to Others None Disclosed Attitude regarding Aggression / Violence None Disclosed Imminent Risk for Violence in Community Low Imminent Risk for Violence in Facility Low Current and Past Psychiatric Diagnoses Unable to Assess This patient was screened using the [place] Suicide Severity Rating Scale (CSSRS)  Yes I conducted a suicide risk assessment including a suicide inquiry and assessment of risk and protective factors, as recommended by the standard Suicide Assessment Five-Step Evaluation and Triage (SAFE-T) for Mental Health Professionals. No, the C-SSRS did not produce a positive screen Cause for concern None Based on my assessment, the level of risk for this patient to suicide in an inpatient or emergency setting is:  MINIMAL because the patient does not present with suicidal ideation, does not have a history of suicide attempts, and the balance of protective factors outweighs any current risk factors Based on my assessment, the level of risk for this patient to suicide in the community is:  MINIMAL Substance Use  Active substance use No Coping  Reaction to Event/Health Status Adjusting Medically Ready for Discharge  Is this patient medically ready for discharge? No Expected Discharge Date 09/18/23 Discharge Plan  Expected Discharge Date 09/18/23 Formulation: Recommendation(s) and Intervention(s) (including for discharge to occur)  Psychosocial issues requiring intervention Thinking about changing her conservator Psychosocial interventions 60 minutes spent face to face with Ms. Flaherty due to a social work consultation placed regarding the patient requesting to have a new Acupuncturist. I introduced myself and explained the reason for my visit,  Ms. Delafosse was a little disappointed that I was informed of her wishes. She reports that she does not want everyone to know and did not realize that the doctor would share her concerns to others. I explained that the medical team only informed me, because as a Child psychotherapist that is part of my duties at this hospital. She smiled and explained that she understood, she reports that her sister is her conservator and that she does not like her behavior lately. She reports that her sister behaves like she cannot ask her questions about her own personal finances. She reports that they get into little unnecessary arguments. She also reports it is frustrating and that her plan is to have the Child psychotherapist at [employer] to help her write a letter to the [place] in [place] where her conservatorship hearing was held. Ms. Dohrmann reports that she last lived in [place], and when her mother passed away, she moved back to [place] where her siblings live. She reports that she never got married and that she has no children, however, she reports that she lived a good life. She reports that she is not very happy living at a nursing home and explained that she has the feeling of being locked up. We addressed her feelings where she came to realize that she is being totally cared for and that she is a safe environment and agreed. I got up from  sitting and she asked if I had to leave, I asked if I could help with anything else and she reported that she enjoyed talking to me. We addressed her attending daily activities at [facility], and she smiled stating that she does participate in the activities that she enjoys. I provided emotional support around her conservator stressors, and I validated her feelings. Collaborations Medical team Specific referrals to enhance community supports (include existing and new resources) [facility] Handoff Required? No Social Work will take lead to arrange post-acute care services and continue work with the care team as patient progresses towards discharge No Next Steps/Plan (including hand-off): Social work support available throughout hospital stay Signature: Theresia Lo, LMSW Contact Information: [phone]   Theresia Lo, LMSWSocial WorkerGreenwich Hospital203 [phone]

## 2023-09-16 NOTE — Plan of Care
 Plan of Care Overview/ Patient Status    0700-1900 Pt alert and oriented x2, pleasant, able to makes needs known, c/o discomfort with t/p but ok after being positioned. Afebrile, HR nsr 80's, SBP 90-100's- given bolus as ordered, no edema. Remains on RA, lungs clear/diminished. Tolerating diet, colostomy with adequate output-as documented, foley in place with adequate output. Skin as documented. Pt updated on POC.

## 2023-09-16 NOTE — Telephone Encounter
 Per Dr Hulen Luster request, surgery scheduled on 09/24/23 at [facility] MAIN OR.No pre-op clearance required.  Pt currently admitted.

## 2023-09-16 NOTE — Plan of Care
 Plan of Care Overview/ Patient Status    Assumed care 0700. Alert to self & intermittently place, otherwise disoriented. Cooperative with care. Able to make needs known. Limited use of upper extremities. +weakness to lower extremities. Endorsed generalized discomfort, repositioned per tolerance with pillows with +effect. Total care. Room air. Respirations even & unlabored. Breath sounds DIMINISHED bilaterally. No SOB / DOE reported. +infrequent cough. Sp02 >96% NSR. No CP / HA / PALP / DIZZINESS / BV reported. +HYPOtensive, MD Varghese aware, 1.5L bolus ordered & administered with +effect. IVF in place. Trace edema to bilateral lower extremities. Febrile x1, Tylenol per MAR with +effect. HR  70-80s Tolerating CARDIAC diet, fair appetite observed, consumed 50% of provided meals. 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 Colostomy with +flatus & +stool. LBM: 9/24/2024Total: 500 cc FCATH placed with Charge RN Marcelino Duster without complication. Cloudy / turbid urine noted. Output per flowsheets. S2 to bottom, [facility] consult completed, see note. Barrier cream applied to sacrum / coccyx. Prevalon boots to bilateral lower extremities. Repositioned with pillows per tolerance. Family to bedside, updated with POC. Sister updated via phone. Bed in low / locked position. Bed alarm on. Call bell within reach. All care rendered. Safety & comfort maintained. Problem: Diabetes ComorbidityGoal: Blood Glucose Level Within Targeted RangeOutcome: Interventions implemented as appropriate Problem: Violence Risk or ActualGoal: Anger and Impulse ControlOutcome: Interventions implemented as appropriate Problem: Adult Inpatient Plan of CareGoal: Plan of Care ReviewOutcome: Interventions implemented as appropriateGoal: Patient-Specific Goal (Individualized)Outcome: Interventions implemented as appropriateGoal: Absence of Hospital-Acquired Illness or InjuryOutcome: Interventions implemented as appropriateGoal: Optimal Comfort and WellbeingOutcome: Interventions implemented as appropriateGoal: Readiness for Transition of CareOutcome: Interventions implemented as appropriate Problem: Fall Injury RiskGoal: Absence of Fall and Fall-Related InjuryOutcome: Interventions implemented as appropriate Problem: Wound Healing ProgressionGoal: Optimal Wound HealingOutcome: Interventions implemented as appropriate Problem: Skin Injury Risk IncreasedGoal: Skin Health and IntegrityOutcome: Interventions implemented as appropriate

## 2023-09-16 NOTE — Plan of Care
 Plan of Care Overview/ Patient Status    CM met with patient to complete assessment. Patient lives at [facility] and will return at discharge.Case Management will take lead to arrange post -acute care services and continue to work with the care team as the patient progresses towards discharge.Case Management Screening and Evaluation  Flowsheet Row Most Recent Value Case Management Screening: Chart review completed. If YES to any question below then proceed to CM Eval/Plan  Is there a change in their cognitive function No Do you anticipate that the pt will have any discharge needs requiring CM intervention? Yes Has there been an unscheduled readmission within the last 30 days and/or four (4) encounters (encounters include: ED, OBS, Inpatient) within the last six (6) months? Yes Were there services prior to admission ( Examples: Assisted Living, HD, Homecare, Extended Care Facility, Methadone, SNF, Outpatient Infusion Center) Yes Negative/Positive Screen Positive Screening: Complete CM Evaluation and Plan Case manager will take lead to arrange post-acute care services and continue to work with the care team as the patient progresses towards discharge Yes Case Manager Attestation  I have reviewed the medical record and completed the above screen. CM staff will follow patient's progress and discuss the plan of care with the Treatment Team. Yes Case Management Evaluation and Plan  Arrived from prior to admission SNF (receiving long term care) Admitted from: LIVES [facility] Bed Hold  yes  Lives With Facility Resident Services Prior to Admission none Patient Requires Care Coordination Intervention Due To readmission within the last 30 days, resumption of services prior to admission Prior to Hospitalization: Assistance Needed/DME being used None Documented Insurance Accurate Yes Any financial concerns related to anticipated discharge needs No Patient's home address verified Yes Patient's PCP of record verified Yes Last Date Seen by PCP 0-3 months Source of Clinical History  Patient's clinical history has been reviewed and source of Information is: Patient, Medical record Case Manager Attestation  I have reviewed the medical record and completed the above evaluation with the following recommendations. Yes Discharge Planning Coordination Recommendations  Discharge Planning Coordination Recommendations Needs not determined at this time Case Manager reviewed plan of care/ continuum of care need's with  Patient, Interdisciplinary Team, Transitional care rounds  Geanie Cooley, BSN, RN-BCNurse Case ManagerO: [phone]: [phone]

## 2023-09-16 NOTE — Progress Notes
 [facility] Medicine Progress NoteAttending Provider: Les Pou, MD Subjective                                                                              Subjective: Interim History: - Fever 102 noted yesterday afternoon. - Soft blood pressures overnight.  This AM 91/51.  LR 500cc  bolus ordered. - WBC down to 6.5 from 12.8. - Hb 7 from 8 yesterday.  No obvious bleeding. - Mentating well.   Review of Allergies/Meds/Hx: Review of Allergies/Meds/Hx:I have reviewed the patient's: allergies, current scheduled medications, current infusions, current prn medications, past medical history, and prior to admission medications Objective Objective: Vitals:Last 24 hours: Temp:  [97.9 ?F (36.6 ?C)-102 ?F (38.9 ?C)] 98 ?F (36.7 ?C)Pulse:  [63-84] 67Resp:  [16-20] 18BP: (88-106)/(51-64) 91/51SpO2:  [96 %-100 %] 100 %I/O's:Gross Totals (Last 24 hours) at 09/16/2023 1008Last data filed at 09/16/2023 0949Intake 600 ml Output 1920 ml Net -1320 ml Procedures:None Physical Exam Alert oriented to self and [place] [facility] RRR Lungs CTABAbd soft, +ostomy empty Ext warm Labs:Last 8 hours: Recent Results (from the past 8 hour(s)) CBC auto differential  Collection Time: 09/16/23  8:06 AM Result Value Ref Range  WBC 6.5 4.0 - 11.0 x1000/?L  RBC 2.50 (L) 4.00 - 6.00 M/?L  Hemoglobin 7.0 (L) 11.7 - 15.5 g/dL  Hematocrit 09.81 (L) 19.14 - 45.00 %  MCV 90.4 80.0 - 100.0 fL  MCH 28.0 27.0 - 33.0 pg  MCHC 31.0 31.0 - 36.0 g/dL  RDW-CV 78.2 (H) 95.6 - 15.0 %  Platelets 243 150 - 420 x1000/?L  MPV 9.7 8.0 - 12.0 fL  Neutrophils 69.8 39.0 - 72.0 %  Lymphocytes 12.0 (L) 17.0 - 50.0 %  Monocytes 12.5 (H) 4.0 - 12.0 %  Eosinophils 4.9 0.0 - 5.0 %  Basophil 0.5 0.0 - 1.4 %  Immature Granulocytes 0.3 0.0 - 1.0 %  nRBC 0.0 0.0 - 1.0 %  Absolute Lymphocyte Count 0.78 0.60 - 3.70 x 1000/?L  Monocyte Absolute Count 0.81 0.00 - 1.00 x 1000/?L  Eosinophil Absolute Count 0.32 0.00 - 1.00 x 1000/?L  Basophil Absolute Count 0.03 0.00 - 1.00 x 1000/?L  Absolute Immature Granulocyte Count 0.02 0.00 - 0.30 x 1000/?L  Absolute nRBC 0.00 0.00 - 1.00 x 1000/?L  ANC (Abs Neutrophil Count) 4.54 2.00 - 7.60 x 1000/?L Comprehensive metabolic panel  Collection Time: 09/16/23  8:06 AM Result Value Ref Range  Sodium 142 136 - 145 mmol/L  Potassium 3.5 3.5 - 5.1 mmol/L  Chloride 114 95 - 115 mmol/L  CO2 24 21 - 32 mmol/L  Anion Gap 4 (L) 5 - 18  Glucose 92 70 - 100 mg/dL  BUN 10 8 - 25 mg/dL  Creatinine 2.13 0.86 - 1.30 mg/dL  Calcium 9.6 8.4 - 57.8 mg/dL  BUN/Creatinine Ratio 46.9 8.0 - 25.0  Total Protein 7.0 6.4 - 8.2 g/dL  Albumin 2.1 (L) 3.4 - 5.0 g/dL  Total Bilirubin 0.2 0.0 - 1.0 mg/dL  Alkaline Phosphatase 67 20 - 120 U/L  Alanine Aminotransferase (ALT) 19 12 - 78 U/L  Aspartate Aminotransferase (AST) 28 5 - 37 U/L  Globulin 4.9 g/dL  A/G Ratio 0.4   AST/ALT Ratio 1.5 Reference  Range Not Established  Osmolality Calculation 282 275 - 295 mOsm/kg  eGFR (Creatinine) >60 >=60 mL/min/1.77m2 Micro 9/23 Blood cx  Ecoli  (ESBL) 9/23 Urine cx pending Diagnostics:9/23 [place] chest/abdomen/pelvis with IV contrast 1. Moderate left hydroureteronephrosis, increased compared to prior exam, with nephroureteral stent again in place. Urothelial thickening/enhancement and perinephric fat stranding may represent ascending infection.2. Stable bilateral nonobstructing renal stones. 3. No acute findings in the chest.9/23 [place] head 1. Cerebral and cerebellar atrophy. 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 Clinical correlation is recommended.2. Nonspecific white matter lucencies are most likely related to chronic small vessel ischemic changes.3. 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 # Sepsis secondary to acute pyeloureteritis with E coli (ESBL) bacteremia - Has been having intermittent drops in blood pressures that have been fluid responsive. - ICU evaluation completed by Dr. Raina Mina on 9/24. - Urology note by Dr. Hulen Luster 9/24: left pyelonephritis excellent stent placement stone is not visualizedMaybe passHopefully this hospitalization early next week we can consider ureteroscopy see if the stone is still there and removal of stent- Have ordered additional fluid bolus this AM - Blood cx with E coli ESBL . Urine cx pending. - Continue ertapenem, day 2.  - Will d/c vancomycin. - Strict I/os. # Acute on chronic anemia - Hb 7 from 9.3 on admission.  Baseline appears 9-10.  Suspect partly dilutional, partly sepsis related. - Discussed risks/benefits of transfusion however patient declines transfusion.  She acknowledges that it is a big drop from 9 to 7 but doesn't want blood transfusion because of a bad reaction that her sister had from transfusion.  She does not want me to discuss further with her sister [name] Charity fundraiser).  - We left it at that if she were to develop over signs of bleeding or if Hb < 7 rediscussion would be had.   She agreed.  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 Other issues stable Hx RAHx peripheral neuropathy - gabapentin dose reduced. SW consult - patient would like to find out steps about obtaining new conservator.  She asks me not to contact [name] .  DVT prophy - apixaban  Electronically Signed:Danetta Prom Baldo Daub, MD Beeper 26709/25/2024, 8:20 AM

## 2023-09-17 LAB — BASIC METABOLIC PANEL
BKR ANION GAP: 3 — ABNORMAL LOW (ref 5–18)
BKR BLOOD UREA NITROGEN: 12 mg/dL (ref 8–25)
BKR BUN / CREAT RATIO: 15.8 (ref 8.0–25.0)
BKR CALCIUM: 9.9 mg/dL (ref 8.4–10.3)
BKR CHLORIDE: 116 mmol/L — ABNORMAL HIGH (ref 95–115)
BKR CO2: 22 mmol/L (ref 21–32)
BKR CREATININE: 0.76 mg/dL (ref 0.50–1.30)
BKR EGFR, CREATININE (CKD-EPI 2021): >60 mL/min/{1.73_m2} (ref >=60)
BKR GLUCOSE: 98 mg/dL (ref 70–100)
BKR OSMOLALITY CALCULATION: 281 mosm/kg (ref 275–295)
BKR POTASSIUM: 3.4 mmol/L — ABNORMAL LOW (ref 3.5–5.1)
BKR SODIUM: 141 mmol/L (ref 136–145)

## 2023-09-17 LAB — CBC WITH AUTO DIFFERENTIAL
BKR WAM ABSOLUTE IMMATURE GRANULOCYTES.: 0.01 x 1000/ÂµL (ref 0.00–0.30)
BKR WAM ABSOLUTE LYMPHOCYTE COUNT.: 1.19 x 1000/ÂµL (ref 0.60–3.70)
BKR WAM ABSOLUTE NRBC (2 DEC): 0 x 1000/ÂµL (ref 0.00–1.00)
BKR WAM ANC (ABSOLUTE NEUTROPHIL COUNT): 2.81 x 1000/ÂµL (ref 2.00–7.60)
BKR WAM BASOPHIL ABSOLUTE COUNT.: 0.02 x 1000/ÂµL (ref 0.00–1.00)
BKR WAM BASOPHILS: 0.4 % (ref 0.0–1.4)
BKR WAM EOSINOPHIL ABSOLUTE COUNT.: 0.19 x 1000/ÂµL (ref 0.00–1.00)
BKR WAM EOSINOPHILS: 4 % (ref 0.0–5.0)
BKR WAM HEMATOCRIT (2 DEC): 23.7 % — ABNORMAL LOW (ref 35.00–45.00)
BKR WAM HEMOGLOBIN: 7.2 g/dL — ABNORMAL LOW (ref 11.7–15.5)
BKR WAM IMMATURE GRANULOCYTES: 0.2 % (ref 0.0–1.0)
BKR WAM LYMPHOCYTES: 25.3 % (ref 17.0–50.0)
BKR WAM MCH (PG): 27.5 pg (ref 27.0–33.0)
BKR WAM MCHC: 30.4 g/dL — ABNORMAL LOW (ref 31.0–36.0)
BKR WAM MCV: 90.5 fL (ref 80.0–100.0)
BKR WAM MONOCYTE ABSOLUTE COUNT.: 0.48 x 1000/ÂµL (ref 0.00–1.00)
BKR WAM MONOCYTES: 10.2 % (ref 4.0–12.0)
BKR WAM MPV: 9.7 fL (ref 8.0–12.0)
BKR WAM NEUTROPHILS: 59.9 % (ref 39.0–72.0)
BKR WAM NUCLEATED RED BLOOD CELLS: 0 % (ref 0.0–1.0)
BKR WAM PLATELETS: 316 x1000/ÂµL (ref 150–420)
BKR WAM RDW-CV: 15.8 % — ABNORMAL HIGH (ref 11.0–15.0)
BKR WAM RED BLOOD CELL COUNT.: 2.62 M/ÂµL — ABNORMAL LOW (ref 4.00–6.00)
BKR WAM WHITE BLOOD CELL COUNT: 4.7 x1000/ÂµL (ref 4.0–11.0)

## 2023-09-17 LAB — BLOOD CULTURE   (BH GH L LMW YH): BKR BLOOD CULTURE: ABNORMAL — CR

## 2023-09-17 MED ORDER — POTASSIUM CHLORIDE ER 20 MEQ TABLET,EXTENDED RELEASE(PART/CRYST)
20 | Freq: Once | ORAL | Status: CP
Start: 2023-09-17 — End: ?
  Administered 2023-09-17: 20:00:00 20 MEQ via ORAL

## 2023-09-17 NOTE — Telephone Encounter
 FYI asked md verbally - per md canceled cysto and he saw [place] scan at the moment pt does not need [place] scan, I left message to [facility] letting her know cystoscopy is canceled and that she needs to call us back when patient is discharged from [facility]

## 2023-09-17 NOTE — Progress Notes
 [facility] Medicine Progress NoteAttending Provider: Lyndee Leo, DO Subjective                                                                              Subjective: Interim History: patient is AAO times 4, she does not want the telemetry, she states she is not feeling well however much better than when she came in. She understands next week there will be stent exchange with Dr. Hulen Luster. Patient states she has been in [facility] for about 7 months. She states she frequently sits in wet diaper for long periods of time. She is also not happy with her sister being her conservator because she believes sister is not involved with her care and that she is taking money. Review of Allergies/Meds/Hx: Review of Allergies/Meds/Hx:I have reviewed the patient's: allergies, current scheduled medications, current infusions, current prn medications, past medical history, past surgical history, family history, social history, and prior to admission medications Objective Objective: Vitals:Last 24 hours: Temp:  [97.5 ?F (36.4 ?C)-99 ?F (37.2 ?C)] 97.5 ?F (36.4 ?C)Pulse:  [70-82] 70Resp:  [17-18] 18BP: (92-103)/(56-63) 99/63SpO2:  [97 %-99 %] 98 %I/O's:Gross Totals (Last 24 hours) at 09/17/2023 0844Last data filed at 09/17/2023 0550Intake 600 ml Output 1245 ml Net -645 ml Physical ExamConstitutional:     Appearance: Normal appearance. HENT:    Head: Normocephalic and atraumatic. Cardiovascular:    Rate and Rhythm: Normal rate and regular rhythm.    Pulses: Normal pulses.    Heart sounds: Normal heart sounds. No murmur heard.Pulmonary:    Effort: Pulmonary effort is normal.    Breath sounds: Normal breath sounds. Abdominal:    General: Abdomen is flat. Bowel sounds are normal.    Palpations: Abdomen is soft.    Comments: Right colostomy in place, bandage on prior PEG tube site non healing, no drainage Musculoskeletal:       General: Normal range of motion. Skin:   General: Skin is warm and dry. Neurological:    Mental Status: She is alert and oriented to person, place, and time.    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 Patient seen by urology,  # Sepsis secondary to acute pyeloureteritis with E coli (ESBL) bacteremia - 07/15/2023: right stent was removed  and left stent was advanced to the renal pelvis, small perforation seen, mild extravasation.- Has been having intermittent drops in blood pressures that have been fluid responsive. - ICU evaluation completed by Dr. Raina Mina on  9/24. - Urology note by Dr. Hulen Luster 9/24: left pyelonephritis excellent stent placement stone is not visualizedMaybe passHopefully this hospitalization early next week we can consider ureteroscopy see if the stone is still there and removal of stent- Blood cx with E coli ESBL . Urine cx pending. - Continue ertapenem, day 3  - pending next Thursday 10/3 ureteroscopy stent exchange destruction of stones- RCRI is zero which confers to 3.9% 30 day risk of death, MI or cardiac arrest: no history of MI, recent TTE 02/2023 with normal EF, stable kidney function, no history of stroke, not on insulin- EKG and CXR unremarkable, poor functional status- patient is an intermediate risk patient for a low risk procedure, no further work up prior to OR # Acute on chronic anemia - Hg is 7.2 today, baseline appears 9-10.  Suspect partly dilutional, partly sepsis related. - Discussed risks/benefits of transfusion however patient declines transfusion.  She acknowledges that it is a big drop from 9 to 7 but doesn't want blood transfusion because of a bad reaction that her sister had from transfusion.  She does not want me to discuss further with her sister [name] Charity fundraiser).  - We left it at that if she were to develop over signs of bleeding or if Hb < 7 rediscussion would be had.   She agreed.  - ferrous gluconate 324mg  every other day # Hx DVT - Continue eliquis 5mg  BID # Presence of ostomy - Routine ostomy care# HLD- continue crestor 10mg  once daily Other issues stable Hx RAHx peripheral neuropathy - gabapentin dose reduced.  SW consult - patient would like to find out steps about obtaining new conservator.  Patient does not want me to call conservator====* Dysphagia diet, NDD3* DVT ppx: eliquis* Full codePatient was seen and examined on 09/17/2023  Comorbidities Comorbidities present on admission:Coagulopathic due to prescription of anticoagulant medication prior to admission. Secondary diagnoses occurring during hospitalization:Hyponatremia Electronically Signed:Emmaus Brandi, DO Beeper 22539/26/2024, 8:42 AM

## 2023-09-17 NOTE — Other
 Report was given to Eny, receiving RN on medicine. Pt in no acute distress at this time. AAOx2. Will be transferring without RTM. On RA. All belongings gathered from bedside and being taken with patient.

## 2023-09-17 NOTE — Telephone Encounter
 Dr. Hulen Luster pt still admitted at [facility] did a [place] not sure if its the same that you request for pt do prior her cystoscopy, she is scheduled for cystoscopy next week 10/1 - spoke with Okey Dupre and she is not sure if pt will be discharged by next week and if she is, pt will be strong enough to do this cystoscopy- keep cysto? That [place] is enough?

## 2023-09-17 NOTE — Other
 According to phlebotomy, pt has been refusing morning labs, therefore CBC, BMP have not been drawn for today. Will talk with patient right now and see if she is agreeable to labs now.

## 2023-09-18 LAB — URINE CULTURE

## 2023-09-18 NOTE — Plan of Care
 Plan of Care Overview/ Patient Status

## 2023-09-18 NOTE — Progress Notes
 [facility] Medicine Progress NoteAttending Provider: Lyndee Leo, DO Subjective                                                                              Subjective: Interim History: no acute overnight events. Patient having left sided flank pain, otherwise doing okay. Review of Allergies/Meds/Hx: Review of Allergies/Meds/Hx:I have reviewed the patient's: allergies, current scheduled medications, current infusions, current prn medications, past medical history, past surgical history, family history, social history, and prior to admission medications Objective Objective: Vitals:Last 24 hours: Temp:  [97.1 ?F (36.2 ?C)-98.3 ?F (36.8 ?C)] 97.1 ?F (36.2 ?C)Pulse:  [61-86] 70Resp:  [16-18] 18BP: (90-98)/(61-71) 94/71SpO2:  [96 %-98 %] 97 %I/O's:Gross Totals (Last 24 hours) at 09/18/2023 1631Last data filed at 09/18/2023 0600Intake -- Output 1000 ml Net -1000 ml  Physical ExamConstitutional:     Appearance: Normal appearance. HENT:    Head: Normocephalic and atraumatic. Cardiovascular:    Rate and Rhythm: Normal rate and regular rhythm.    Pulses: Normal pulses.    Heart sounds: Normal heart sounds. No murmur heard.Pulmonary:    Effort: Pulmonary effort is normal.    Breath sounds: Normal breath sounds. Abdominal:    General: Abdomen is flat. Bowel sounds are normal.    Palpations: Abdomen is soft.    Comments: Right colostomy in place, bandage on prior PEG tube site non healing, no drainage Musculoskeletal:       General: Normal range of motion. Skin:   General: Skin is warm and dry. Neurological:    Mental Status: She is alert and oriented to person, place, and time.    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 Patient seen by urology,  # Sepsis secondary to acute pyeloureteritis with E coli (ESBL) bacteremia - 07/15/2023: right stent was removed  and left stent was advanced to the renal pelvis, small perforation seen, mild extravasation.- Has been having intermittent drops in blood pressures that have been fluid responsive. - ICU evaluation completed by Dr. Raina Mina on 9/24. - Urology note by Dr. Hulen Luster 9/24: left pyelonephritis excellent stent placement stone is not visualizedMaybe passHopefully this hospitalization early next week we can consider ureteroscopy see if the stone is still there and removal of stent- Blood cx with E coli ESBL . Urine cx pending. - Continue ertapenem, day 4  - pending next Thursday 10/3 ureteroscopy stent exchange destruction of stones-  RCRI is zero which confers to 3.9% 30 day risk of death, MI or cardiac arrest: no history of MI, recent TTE 02/2023 with normal EF, stable kidney function, no history of stroke, not on insulin- EKG and CXR unremarkable, poor functional status- patient is an intermediate risk patient for a low risk procedure, no further work up prior to OR # Acute on chronic anemia - stable, baseline appears 9-10.  Suspect partly dilutional, partly sepsis related. - Discussed risks/benefits of transfusion however patient declines transfusion.  She acknowledges that it is a big drop from 9 to 7 but doesn't want blood transfusion because of a bad reaction that her sister had from transfusion.  She does not want me to discuss further with her sister [name] Charity fundraiser).  - We left it at that if she were to develop over signs of bleeding or if Hb < 7 rediscussion would be had.   She agreed.  - ferrous gluconate 324mg  every other day # Hx DVT - Continue eliquis 5mg  BID # Presence of ostomy - Routine ostomy care# Hypokalemia- replete # HLD- continue crestor 10mg  once daily Other issues stable Hx RAHx peripheral neuropathy - gabapentin dose reduced.  SW consult - patient would like to find out steps about obtaining new conservator.  Patient does not want me to call conservator ====* Dysphagia diet, NDD3* DVT ppx: eliquis* Full code Patient was seen and examined on 09/18/2023  Comorbidities Comorbidities present on admission:Coagulopathic due to prescription of anticoagulant medication prior to admission. Secondary diagnoses occurring during hospitalization:Hyponatremia Electronically Signed:Loi Rennaker, DO Beeper 22539/27/2024, 4:31 PM

## 2023-09-18 NOTE — Plan of Care
 Problem: Diabetes ComorbidityGoal: Blood Glucose Level Within Targeted Range9/27/2024 1911 by Wilford Grist, RNOutcome: Interventions implemented as appropriate9/27/2024 1911 by Wilford Grist, RNOutcome: Interventions implemented as appropriate Problem: Violence Risk or ActualGoal: Anger and Impulse Control9/27/2024 1911 by Wilford Grist, RNOutcome: Interventions implemented as appropriate9/27/2024 1911 by Wilford Grist, RNOutcome: Interventions implemented as appropriate Problem: Adult Inpatient Plan of CareGoal: Plan of Care Review9/27/2024 1911 by Wilford Grist, RNOutcome: Interventions implemented as appropriate9/27/2024 1911 by Wilford Grist, RNOutcome: Interventions implemented as appropriateGoal: Patient-Specific Goal (Individualized)09/18/2023 1911 by Wilford Grist, RNOutcome: Interventions implemented as appropriate9/27/2024 1911 by Wilford Grist, RNOutcome: Interventions implemented as appropriateGoal: Absence of Hospital-Acquired Illness or Injury9/27/2024 1911 by Wilford Grist, RNOutcome: Interventions implemented as appropriate9/27/2024 1911 by Wilford Grist, RNOutcome: Interventions implemented as appropriateGoal: Optimal Comfort and Wellbeing9/27/2024 1911 by Wilford Grist, RNOutcome: Interventions implemented as appropriate9/27/2024 1911 by Wilford Grist, RNOutcome: Interventions implemented as appropriateGoal: Readiness for Transition of Care9/27/2024 1911 by Wilford Grist, RNOutcome: Interventions implemented as appropriate9/27/2024 1911 by Wilford Grist, RNOutcome: Interventions implemented as appropriate Problem: Fall Injury RiskGoal: Absence of Fall and Fall-Related Injury9/27/2024 1911 by Wilford Grist, RNOutcome: Interventions implemented as appropriate9/27/2024 1911 by Wilford Grist, RNOutcome: Interventions implemented as appropriate Problem: Wound Healing ProgressionGoal: Optimal Wound Healing9/27/2024 1911 by Wilford Grist, RNOutcome: Interventions implemented as appropriate9/27/2024 1911 by Wilford Grist, RNOutcome: Interventions implemented as appropriate Problem: Skin Injury Risk IncreasedGoal: Skin Health and Integrity9/27/2024 1911 by Wilford Grist, RNOutcome: Interventions implemented as appropriate9/27/2024 1911 by Wilford Grist, RNOutcome: Interventions implemented as appropriate Problem: InfectionGoal: Absence of Infection Signs and Symptoms9/27/2024 1911 by Wilford Grist, RNOutcome: Interventions implemented as appropriate9/27/2024 1911 by Wilford Grist, RNOutcome: Interventions implemented as appropriate Plan of Care Overview/ Patient Status    Patient is alert and confused very forgetful. Patient had complaints of lower back pain, I medicated with tylenol with good effect. Foley is possibly leaking. I noted fluid under the patients incontinent pad that looks the same color as her urine I notified the provider stromboukis who states discontinue foley. Patient colostomy bag changed times 2 this shift. She has a poor appetite. Vitals are stable on room air. Vital Sign History Chronological  09/18/2023   4:43 AM 09/18/2023   8:12 AM 09/18/2023   2:52 PM Vital Signs Temp 97.8 ?F (36.6 ?C) 97.7 ?F (36.5 ?C) 97.1 ?F (36.2 ?C) Temp src Axillary Oral Oral Pulse 64 61 70 BP 97/62 90/61 94/71  Resp 18 18 18  SpO2 97 % 96 % 97 %

## 2023-09-18 NOTE — Plan of Care
 Plan of Care Overview/ Patient Status    Patient is AAOx3 forgetful. Denies pain/discomfort. Has foley with cloudy urine and colostomy on the right lower quad abdomen draining loose greenish colored stool. Turned and repositioned. Slept on and off. No acute distress noted. Will continue to monitor patient.Problem: Diabetes ComorbidityGoal: Blood Glucose Level Within Targeted RangeOutcome: Interventions implemented as appropriate Problem: Violence Risk or ActualGoal: Anger and Impulse ControlOutcome: Interventions implemented as appropriate Problem: Adult Inpatient Plan of CareGoal: Plan of Care ReviewOutcome: Interventions implemented as appropriateGoal: Patient-Specific Goal (Individualized)Outcome: Interventions implemented as appropriateGoal: Absence of Hospital-Acquired Illness or InjuryOutcome: Interventions implemented as appropriateGoal: Optimal Comfort and WellbeingOutcome: Interventions implemented as appropriateGoal: Readiness for Transition of CareOutcome: Interventions implemented as appropriate Problem: Fall Injury RiskGoal: Absence of Fall and Fall-Related InjuryOutcome: Interventions implemented as appropriate Problem: Wound Healing ProgressionGoal: Optimal Wound HealingOutcome: Interventions implemented as appropriate Problem: Skin Injury Risk IncreasedGoal: Skin Health and IntegrityOutcome: Interventions implemented as appropriate Problem: InfectionGoal: Absence of Infection Signs and SymptomsOutcome: Interventions implemented as appropriate

## 2023-09-19 LAB — COMPREHENSIVE METABOLIC PANEL
BKR A/G RATIO: 0.5
BKR ALANINE AMINOTRANSFERASE (ALT): 12 U/L (ref 12–78)
BKR ALBUMIN: 2.3 g/dL — ABNORMAL LOW (ref 3.4–5.0)
BKR ALKALINE PHOSPHATASE: 65 U/L (ref 20–120)
BKR ANION GAP: 4 — ABNORMAL LOW (ref 5–18)
BKR ASPARTATE AMINOTRANSFERASE (AST): 10 U/L (ref 5–37)
BKR AST/ALT RATIO: 0.8
BKR BILIRUBIN TOTAL: 0.1 mg/dL (ref 0.0–1.0)
BKR BLOOD UREA NITROGEN: 11 mg/dL (ref 8–25)
BKR BUN / CREAT RATIO: 14.7 (ref 8.0–25.0)
BKR CALCIUM: 10 mg/dL (ref 8.4–10.3)
BKR CHLORIDE: 114 mmol/L (ref 95–115)
BKR CO2: 24 mmol/L (ref 21–32)
BKR CREATININE: 0.75 mg/dL (ref 0.50–1.30)
BKR EGFR, CREATININE (CKD-EPI 2021): >60 mL/min/{1.73_m2} (ref >=60)
BKR GLOBULIN: 5 g/dL
BKR GLUCOSE: 97 mg/dL (ref 70–100)
BKR OSMOLALITY CALCULATION: 282 mosm/kg (ref 275–295)
BKR POTASSIUM: 3.3 mmol/L — ABNORMAL LOW (ref 3.5–5.1)
BKR PROTEIN TOTAL: 7.3 g/dL (ref 6.4–8.2)
BKR SODIUM: 142 mmol/L (ref 136–145)

## 2023-09-19 LAB — CBC WITH AUTO DIFFERENTIAL
BKR WAM ABSOLUTE IMMATURE GRANULOCYTES.: 0.01 x 1000/ÂµL (ref 0.00–0.30)
BKR WAM ABSOLUTE LYMPHOCYTE COUNT.: 1.42 x 1000/ÂµL (ref 0.60–3.70)
BKR WAM ABSOLUTE NRBC (2 DEC): 0 x 1000/ÂµL (ref 0.00–1.00)
BKR WAM ANC (ABSOLUTE NEUTROPHIL COUNT): 2.7 x 1000/ÂµL (ref 2.00–7.60)
BKR WAM BASOPHIL ABSOLUTE COUNT.: 0.03 x 1000/ÂµL (ref 0.00–1.00)
BKR WAM BASOPHILS: 0.6 % (ref 0.0–1.4)
BKR WAM EOSINOPHIL ABSOLUTE COUNT.: 0.34 x 1000/ÂµL (ref 0.00–1.00)
BKR WAM EOSINOPHILS: 6.7 % — ABNORMAL HIGH (ref 0.0–5.0)
BKR WAM HEMATOCRIT (2 DEC): 23.8 % — ABNORMAL LOW (ref 35.00–45.00)
BKR WAM HEMOGLOBIN: 7.2 g/dL — ABNORMAL LOW (ref 11.7–15.5)
BKR WAM IMMATURE GRANULOCYTES: 0.2 % (ref 0.0–1.0)
BKR WAM LYMPHOCYTES: 28 % (ref 17.0–50.0)
BKR WAM MCH (PG): 27.6 pg (ref 27.0–33.0)
BKR WAM MCHC: 30.3 g/dL — ABNORMAL LOW (ref 31.0–36.0)
BKR WAM MCV: 91.2 fL (ref 80.0–100.0)
BKR WAM MONOCYTE ABSOLUTE COUNT.: 0.58 x 1000/ÂµL (ref 0.00–1.00)
BKR WAM MONOCYTES: 11.4 % (ref 4.0–12.0)
BKR WAM MPV: 9.4 fL (ref 8.0–12.0)
BKR WAM NEUTROPHILS: 53.1 % (ref 39.0–72.0)
BKR WAM NUCLEATED RED BLOOD CELLS: 0 % (ref 0.0–1.0)
BKR WAM PLATELETS: 365 x1000/ÂµL (ref 150–420)
BKR WAM RDW-CV: 15.9 % — ABNORMAL HIGH (ref 11.0–15.0)
BKR WAM RED BLOOD CELL COUNT.: 2.61 M/ÂµL — ABNORMAL LOW (ref 4.00–6.00)
BKR WAM WHITE BLOOD CELL COUNT: 5.1 x1000/ÂµL (ref 4.0–11.0)

## 2023-09-19 LAB — OCCULT BLOOD *2 OR *3     (BH GH YH): BKR STOOL OCC BLOOD #1: NEGATIVE

## 2023-09-19 MED ORDER — POTASSIUM CHLORIDE ER 20 MEQ TABLET,EXTENDED RELEASE(PART/CRYST)
20 | Freq: Once | ORAL | Status: CP
Start: 2023-09-19 — End: ?
  Administered 2023-09-19: 17:00:00 20 MEQ via ORAL

## 2023-09-19 NOTE — Plan of Care
 Plan of Care Overview/ Patient Status    0700 Care assumed. Pt received in bed, A&Ox3-4, forgetful, room air, bedrest. Ileostomy in place, liquid stool. No c/o pain or discomfort. Medication administered per MAR, whole, tolerated well.Loss of IV access d/t infiltration, new IV by [place] to L arm. PICC line ordered.Pt resting in bed, HOB elevated, chest rising and falling, contact precautions maintained, safety precautions maintained, call bell within reach.Problem: Diabetes ComorbidityGoal: Blood Glucose Level Within Targeted RangeOutcome: Interventions implemented as appropriate Problem: Violence Risk or ActualGoal: Anger and Impulse ControlOutcome: Interventions implemented as appropriate Problem: Adult Inpatient Plan of CareGoal: Plan of Care ReviewOutcome: Interventions implemented as appropriateGoal: Patient-Specific Goal (Individualized)Outcome: Interventions implemented as appropriateGoal: Absence of Hospital-Acquired Illness or InjuryOutcome: Interventions implemented as appropriateGoal: Optimal Comfort and WellbeingOutcome: Interventions implemented as appropriateGoal: Readiness for Transition of CareOutcome: Interventions implemented as appropriate Problem: Fall Injury RiskGoal: Absence of Fall and Fall-Related InjuryOutcome: Interventions implemented as appropriate Problem: Wound Healing ProgressionGoal: Optimal Wound HealingOutcome: Interventions implemented as appropriate Problem: Skin Injury Risk IncreasedGoal: Skin Health and IntegrityOutcome: Interventions implemented as appropriate Problem: InfectionGoal: Absence of Infection Signs and SymptomsOutcome: Interventions implemented as appropriate

## 2023-09-19 NOTE — Progress Notes
 [facility] Medicine Progress NoteAttending Provider: Lyndee Leo, DO Subjective                                                                              Subjective: Interim History: no acute overnight events. Patient feeling better today, IV infiltrated, pending another IV placement with US guidance. Review of Allergies/Meds/Hx: Review of Allergies/Meds/Hx:I have reviewed the patient's: allergies, current scheduled medications, current infusions, current prn medications, past medical history, past surgical history, family history, social history, and prior to admission medications Objective Objective: Vitals:Last 24 hours: Temp:  [97.1 ?F (36.2 ?C)-99.5 ?F (37.5 ?C)] 97.1 ?F (36.2 ?C)Pulse:  [55-75] 61Resp:  [18-19] 18BP: (91-99)/(55-71) 93/55SpO2:  [95 %-100 %] 95 %I/O's:Gross Totals (Last 24 hours) at 09/19/2023 1225Last data filed at 09/18/2023 1800Intake -- Output 600 ml Net -600 ml Physical ExamConstitutional:     Appearance: Normal appearance. HENT:    Head: Normocephalic and atraumatic. Cardiovascular:    Rate and Rhythm: Normal rate and regular rhythm.    Pulses: Normal pulses.    Heart sounds: Normal heart sounds. No murmur heard.Pulmonary:    Effort: Pulmonary effort is normal.    Breath sounds: Normal breath sounds. Abdominal:    General: Abdomen is flat. Bowel sounds are normal.    Palpations: Abdomen is soft.    Comments: Right colostomy in place, bandage on prior PEG tube site non healing, no drainage Musculoskeletal:       General: Normal range of motion. Skin:   General: Skin is warm and dry. Neurological:    Mental Status: She is alert and oriented to person, place, and time.    Comments: Lower extremity motor strength 1/5 Labs:Last 24 hours: Recent Results (from the past 24 hour(s)) CBC auto differential  Collection Time: 09/19/23  6:32 AM Result Value Ref Range  WBC 5.1 4.0 - 11.0 x1000/?L  RBC 2.61 (L) 4.00 - 6.00 M/?L  Hemoglobin 7.2 (L) 11.7 - 15.5 g/dL  Hematocrit 16.10 (L) 96.04 - 45.00 %  MCV 91.2 80.0 - 100.0 fL  MCH 27.6 27.0 - 33.0 pg  MCHC 30.3 (L) 31.0 - 36.0 g/dL  RDW-CV 54.0 (H) 98.1 - 15.0 %  Platelets 365 150 - 420 x1000/?L  MPV 9.4 8.0 - 12.0 fL  Neutrophils 53.1 39.0 - 72.0 %  Lymphocytes 28.0 17.0 - 50.0 %  Monocytes 11.4 4.0 - 12.0 %  Eosinophils 6.7 (H) 0.0 - 5.0 %  Basophil 0.6 0.0 - 1.4 %  Immature Granulocytes 0.2 0.0 - 1.0 %  nRBC 0.0 0.0 - 1.0 %  Absolute Lymphocyte Count 1.42 0.60 - 3.70 x 1000/?L  Monocyte Absolute Count 0.58 0.00 - 1.00 x 1000/?L  Eosinophil Absolute Count 0.34 0.00 - 1.00 x 1000/?L  Basophil Absolute Count 0.03 0.00 - 1.00 x 1000/?L  Absolute Immature Granulocyte Count 0.01 0.00 - 0.30 x 1000/?L  Absolute nRBC 0.00 0.00 - 1.00 x 1000/?L  ANC (Abs Neutrophil Count) 2.70 2.00 - 7.60 x 1000/?L Comprehensive metabolic panel  Collection Time: 09/19/23  6:32 AM Result Value Ref Range  Sodium 142 136 - 145 mmol/L  Potassium 3.3 (L) 3.5 - 5.1 mmol/L  Chloride 114 95 - 115 mmol/L  CO2 24 21 - 32  mmol/L  Anion Gap 4 (L) 5 - 18  Glucose 97 70 - 100 mg/dL  BUN 11 8 - 25 mg/dL  Creatinine 1.61 0.96 - 1.30 mg/dL  Calcium 04.5 8.4 - 40.9 mg/dL  BUN/Creatinine Ratio 81.1 8.0 - 25.0  Total Protein 7.3 6.4 - 8.2 g/dL  Albumin 2.3 (L) 3.4 - 5.0 g/dL  Total Bilirubin 0.1 0.0 - 1.0 mg/dL  Alkaline Phosphatase 65 20 - 120 U/L  Alanine Aminotransferase (ALT) 12 12 - 78 U/L  Aspartate Aminotransferase (AST) 10 5 - 37 U/L  Globulin 5.0 g/dL  A/G Ratio 0.5   AST/ALT Ratio 0.8 Reference Range Not Established  Osmolality Calculation 282 275 - 295 mOsm/kg  eGFR (Creatinine) >60 >=60 mL/min/1.54m2 Diagnostics: Assessment Assessment: Assessment:Principal Problem:  Sepsis, due to unspecified organism, unspecified whether acute organ dysfunction present (HC Code) (HC CODE) (HC Code)  SNOMED [place](R): SEPSIS  Active Problems:  UTI (urinary tract infection)  SNOMED [place](R): URINARY TRACT INFECTIOUS DISEASE    Plan Plan: Patient is a 67 year old woman with PMHx of cognitive impairment, hx DVT, RA, 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 Patient seen by urology, pending left stent exchange on Thursday 10/3.  # Sepsis secondary to acute pyeloureteritis with E coli (ESBL) bacteremia - 07/15/2023: right stent was removed  and left stent was advanced to the renal pelvis, small perforation seen, mild extravasation.- Has been having intermittent drops in blood pressures that have been fluid responsive. - ICU evaluation completed by Dr. Raina Mina on 9/24. - Urology note by Dr. Hulen Luster 9/24: left pyelonephritis excellent stent placement stone is not visualizedMaybe passHopefully this hospitalization early next week we can consider ureteroscopy see if the stone is still there and removal of stent- Blood cx with E coli ESBL . Urine cx pending. - Continue ertapenem, day 5  - pending next Thursday 10/3 ureteroscopy stent exchange destruction of stones- RCRI is zero which confers to 3.9% 30 day risk of death, MI or cardiac arrest: no history of MI, recent TTE 02/2023 with normal EF, stable kidney function, no history of stroke, not on insulin- EKG and CXR unremarkable, poor functional status- patient is an intermediate risk patient for a low risk procedure, no further work up prior to OR # Acute on chronic anemia - stable, baseline appears 9-10.  Suspect partly dilutional, partly sepsis related. - Discussed risks/benefits of transfusion however patient declines transfusion.  She acknowledges that it is a big drop from 9 to 7 but doesn't want blood transfusion because of a bad reaction that her sister had from transfusion.  She does not want me to discuss further with her sister [name] Charity fundraiser).  - We left it at that if she were to develop over signs of bleeding or if Hb < 7 rediscussion would be had.   She agreed.  - ferrous gluconate 324mg  every other day # Hx DVT - Continue eliquis 5mg  BID # Presence of ostomy - Routine ostomy care # Hypokalemia- replete # HLD- continue crestor 10mg  once daily Other issues stable Hx RAHx peripheral neuropathy - gabapentin dose reduced.  SW consult - patient would like to find out steps about obtaining new  conservator.  Patient does not want me to call conservator ====* Dysphagia diet, NDD3* DVT ppx: eliquis* Full code Patient was seen and examined on 09/19/2023   Comorbidities Comorbidities present on admission:Coagulopathic due to prescription of anticoagulant medication prior to admission. Secondary diagnoses occurring during hospitalization:Hyponatremia Electronically Signed:Tymere Depuy, DO Beeper 22539/28/2024, 12:24 PM

## 2023-09-19 NOTE — Plan of Care
 Plan of Care Overview/ Patient Status    Assumed care for the pt at 19:00; pt alert, oriented x 3, confused about time time/situation at times; vss on room air; remain hypotensive; denies dizziness, light headedness; educated to improved PO fluids intake; pt bed rest this shift;  HS meds taken whole as per MAR; Foley catheter d/c-ed this am 2/2 leak & pt c/o discomfort r/t catheter, covering MD aware; Ileostomy bag intact with liquid stool; pt slept/rested at night; no c/o discomfort voiced; educated on safety & encouraged to call for assistance as need; call light within reach; Problem: Fall Injury RiskGoal: Absence of Fall and Fall-Related InjuryOutcome: Interventions implemented as appropriate Problem: Wound Healing ProgressionGoal: Optimal Wound HealingOutcome: Interventions implemented as appropriate Problem: Skin Injury Risk IncreasedGoal: Skin Health and IntegrityOutcome: Interventions implemented as appropriate Problem: InfectionGoal: Absence of Infection Signs and SymptomsOutcome: Interventions implemented as appropriate Problem: Adult Inpatient Plan of CareGoal: Plan of Care ReviewOutcome: Interventions implemented as appropriateGoal: Patient-Specific Goal (Individualized)Outcome: Interventions implemented as appropriateGoal: Absence of Hospital-Acquired Illness or InjuryOutcome: Interventions implemented as appropriateGoal: Optimal Comfort and WellbeingOutcome: Interventions implemented as appropriateGoal: Readiness for Transition of CareOutcome: Interventions implemented as appropriate Problem: Violence Risk or ActualGoal: Anger and Impulse ControlOutcome: Interventions implemented as appropriate

## 2023-09-20 NOTE — Plan of Care
 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, DVT, PEG placement 01/24/2022 removed, ESBL bacteremia UTI s/p BL ureteral stent placement complicated urologic history, recent admission 7/5-7/10 for septic shock 2/2 UTI and obstructive stone s/p placement of bilateral ureteral stent with subsequent removal of right stent on 7/25 with re-insertion of left ureteral stent complicated by left ureteral injury admitted with sepsis from UTI   Precautions fall   Precautions - Additional Details/Comments ileostomy to the right abdomen   Social History other (comment)   Social History - Additional Details/Comments resident [facility]   Prior Level of Function - Additional Details/Comments per chart bed bound   Subjective okay   General Observations pt rec'd in bed in NAD, IV, purewick, in place, ileostomy to the right abdomen, cooperative      Assessment - 09/20/23 1517    Assessment  Cognition (Mentation/Communication) alert   Cognition - Additional Details/Comments A&Ox3   Vital Signs other (comment)   denies dizziness  Pain Rating 0   Skin Integrity/Edema see skin documentation   Sensation no complaints   Range of Motion within functional limits except (see comments)   Range of Motion - Additional Details/Comments decreased ROM R shldr, deformites bil hands> L, pt is RHD   Muscle Strength/Tone - Additional Details/Comments grossly fair, able to lift extremeties against gravity, decreased R shldr   Balance - Additional Details/Comments poor standing balance, posterior lean, unable to acheive full standing position despite max a x1 and RW, req min ax1 in sitting      Functional Mobility - 09/20/23 1517    Functional Mobility  Supine to/from Sit Moderate assist;Verbal cues;Assist of 1   HOB elevated, use of bed rail  Sit to/from Stand Maximum assist;Verbal cues;Assist of 1   Sit to/from Stand Device Rolling walker   Overall Functional Mobility Comments posterior lean, unable to  acheive full standing position despite max a x 1 and RW, recommend use of stedy      AMPAC Basic Mobility - 09/20/23 1517    PT- AM-PAC - Basic Mobility Screen- How much help from another person do you currently need.....  Turning from your back to your side while in a a flat bed without using rails? 2 - A Lot - Requires a lot of help (maximum to moderate assistance). Can use assistive devices.   Moving from lying on your back to sitting on the side of a flat bed without using bed rails? 2 - A Lot - Requires a lot of help (maximum to moderate assistance). Can use assistive devices.   Moving to and from a bed to a chair (including a wheelchair)? 1 - Total - Requires total assistance or cannot do it at all.   Standing up from a chair using your arms(e.g., wheelchair or bedside chair)? 1 - Total - Requires total assistance or cannot do it at all.   To walk in a hospital room? 1 - Total - Requires total assistance or  cannot do it at all.   Climbing 3-5 steps with a railing? 1 - Total - Requires total assistance or cannot do it at all.   AMPAC Mobility Score 8   TARGET Highest Level of Mobility Mobility Level 3, Sit on edge of bed   ACTUAL Highest Level of Mobility Mobility Level 3, Sit on edge of bed      Recommendations for IP Admission - 09/20/23 1517    PT Recommendations for Inpatient Admission  Activity/Level of Assist out of bed;transfers only;assist of 2;stedy   ADL Recommendations bedside commode;assist of 2;stedy      EDU - Learning Assessment - 09/20/23 1517    Education - Learning Assessment   Education Topic Functional mobility techniques;Safety;Uses of devices/equipment;Therapy role/rehab process;Assistance/guarding   Learners Patient   Readiness Acceptance   Method Demonstration;Explanation   Response Needs Reinforcement      Clinical Impression/Recommendation - 09/20/23 1517    Clinical Impression / Recommendation  Initial Assessment Pt is a 67 y/o F adm 2* sepsis, presents w/ decreased strength and balance, per chart pt is a resident of [facility], bed bound, recommend use of stedy and assist x 2 for OOB mobility, progress as able.   Patient Goal --   return to [facility]  PT Frequency 3x per week   Physical Therapy Disposition Recommendation Moderate complexity support and therapy to progress functional mobility/ ADLs/ IADLs recommended for post- acute care.  See assessment for additional details.      PT Handoff - 09/20/23 1517    Handoff Documentation  Handoff Patient in bed;Bed alarm;Discussed with nursing;Patient instructed to call nursing for mobility     Evelena Leyden, [place] TherapyGreen[facility] [phone]

## 2023-09-20 NOTE — Plan of Care
 Plan of Care Overview/ Patient Status    0700 Care assumed. Pt received in bed, A&Ox3-4, forgetful, room air, bed rest. Ileostomy in place at R abdomen, liquid stool. Purewick in place, amber urine. IV to L arm. Excoriation to bottom, Z guard applied. No c/o pain or discomfort. Medication administered per MAR, whole, tolerated well.1559 Dr Theodoro Grist informed of BP 91/53, HR 67, asymptomatic. Vitals normal for pt, no new orders, will continue to monitor.Pt resting in bed, HOB elevated, chest rising and falling, contact precautions maintained, safety precautions maintained, call bell within reach.Problem: Diabetes ComorbidityGoal: Blood Glucose Level Within Targeted RangeOutcome: Interventions implemented as appropriate Problem: Violence Risk or ActualGoal: Anger and Impulse ControlOutcome: Interventions implemented as appropriate Problem: Adult Inpatient Plan of CareGoal: Plan of Care ReviewOutcome: Interventions implemented as appropriateGoal: Patient-Specific Goal (Individualized)Outcome: Interventions implemented as appropriateGoal: Absence of Hospital-Acquired Illness or InjuryOutcome: Interventions implemented as appropriateGoal: Optimal Comfort and WellbeingOutcome: Interventions implemented as appropriateGoal: Readiness for Transition of CareOutcome: Interventions implemented as appropriate Problem: Fall Injury RiskGoal: Absence of Fall and Fall-Related InjuryOutcome: Interventions implemented as appropriate Problem: Wound Healing ProgressionGoal: Optimal Wound HealingOutcome: Interventions implemented as appropriate Problem: Skin Injury Risk IncreasedGoal: Skin Health and IntegrityOutcome: Interventions implemented as appropriate Problem: InfectionGoal: Absence of Infection Signs and SymptomsOutcome: Interventions implemented as appropriate Problem: Physical Therapy GoalsGoal: Physical Therapy GoalsDescription: 1) bed mob w/ minx12) txfrs w/ RW mod a x2Outcome: Interventions implemented as appropriate

## 2023-09-20 NOTE — Progress Notes
 [facility] Medicine Progress NoteAttending Provider: Lyndee Leo, DO Subjective                                                                              Subjective: Interim History: patient states she is doing well today, feeling tired. I spoke with patient's conservator and sister who understands plan is for midline for tomorrow and stent exchange on Thursday. Review of Allergies/Meds/Hx: Review of Allergies/Meds/Hx:I have reviewed the patient's: allergies, current scheduled medications, current infusions, current prn medications, past medical history, past surgical history, family history, social history, and prior to admission medications Objective Objective: Vitals:Last 24 hours: Temp:  [97.6 ?F (36.4 ?C)-98.2 ?F (36.8 ?C)] 98.2 ?F (36.8 ?C)Pulse:  [52-78] 52Resp:  [18-20] 20BP: (90-97)/(56-64) 94/57SpO2:  [97 %-100 %] 99 %I/O's:Gross Totals (Last 24 hours) at 09/20/2023 1435Last data filed at 09/19/2023 2300Intake -- Output 1000 ml Net -1000 ml Physical ExamConstitutional:     Appearance: Normal appearance. HENT:    Head: Normocephalic and atraumatic. Cardiovascular:    Rate and Rhythm: Normal rate and regular rhythm.    Pulses: Normal pulses.    Heart sounds: Normal heart sounds. No murmur heard.Pulmonary:    Effort: Pulmonary effort is normal.    Breath sounds: Normal breath sounds. Abdominal:    General: Abdomen is flat. Bowel sounds are normal.    Palpations: Abdomen is soft.    Comments: Right colostomy in place, bandage on prior PEG tube site non healing, no drainage Musculoskeletal:       General: Normal range of motion. Skin:   General: Skin is warm and dry. Neurological:    Mental Status: She is alert and oriented to person, place, and time.    Comments: Lower extremity motor strength 1/5 Labs:Last 24 hours: Recent Results (from the past 24 hour(s)) Occult blood x2 or x3  Collection Time: 09/19/23  6:18 PM Result Value Ref Range  Stool Occ Blood #1 Negative Negative Diagnostics: Assessment Assessment: Assessment:Principal Problem:  Sepsis, due to unspecified organism, unspecified whether acute organ dysfunction present (HC Code) (HC CODE) (HC Code)  SNOMED [place](R): SEPSIS  Active Problems:  UTI (urinary tract infection)  SNOMED [place](R): URINARY TRACT INFECTIOUS DISEASE    Plan Plan: Patient is a 67 year old woman with PMHx of cognitive impairment, hx DVT, RA, 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 Patient seen by urology, pending left stent exchange on Thursday 10/3. Midline to be placed tomorrow due to poor veins. # Sepsis secondary to acute pyeloureteritis with E coli (ESBL) bacteremia - 07/15/2023: right stent was removed  and left stent was advanced to the renal pelvis, small perforation seen, mild extravasation.- Has been having intermittent drops in blood pressures that have been fluid responsive. - ICU evaluation completed by Dr. Raina Mina on 9/24. - Urology note by Dr. Hulen Luster 9/24: left pyelonephritis excellent stent placement stone is not visualizedMaybe passHopefully  this hospitalization early next week we can consider ureteroscopy see if the stone is still there and removal of stent- Blood cx with E coli ESBL . Urine cx the same- repeat blood culture today 9/29- Continue ertapenem, day 6- pending next Thursday 10/3 ureteroscopy stent exchange destruction of stones- RCRI is zero which confers to 3.9% 30 day risk of death, MI or cardiac arrest: no history of MI, recent TTE 02/2023 with normal EF, stable kidney function, no history of stroke, not on insulin- EKG and CXR unremarkable, poor functional status- patient is an intermediate risk patient for a low risk procedure, no further work up prior to OR # Acute on chronic anemia - stable, baseline appears 9-10.  Suspect partly dilutional, partly sepsis related. - Discussed risks/benefits of transfusion however patient declines transfusion.  She acknowledges that it is a big drop from 9 to 7 but doesn't want blood transfusion because of a bad reaction that her sister had from transfusion.  She does not want me to discuss further with her sister [name] Charity fundraiser).  - We left it at that if she were to develop over signs of bleeding or if Hb < 7 rediscussion would be had.   She agreed.  - ferrous gluconate 324mg  every other day- stool occult blood negative # Hx DVT - Continue eliquis 5mg  BID, hold tomorrow and can transition to lovenox prior to procedure # Presence of ostomy - Routine ostomy care # Hypokalemia- replete # HLD- continue crestor 10mg  once daily Other issues stable Hx RAHx peripheral neuropathy - gabapentin dose reduced.  ====* Dysphagia diet, NDD3* DVT ppx: eliquis* Full code Patient was seen and examined on 09/20/2023 Comorbidities Comorbidities present on admission:Coagulopathic due to prescription of anticoagulant medication prior to admission. Secondary diagnoses occurring during hospitalization:Hyponatremia Electronically Signed:Edwin Baines, DO Beeper 22539/29/2024, 2:33 PM

## 2023-09-20 NOTE — Plan of Care
 Plan of Care Overview/ Patient Status    Pt is AAOx3, Disoriented to time, on RA saturating at 97%. No SOB or acute respiratory distress noted. No complaints of pain. Bp soft at 97/64. Pt denies any dizziness or lightheaded. Denies CP, headache. Asymptomatic. S/p foley removal. Urine output documented, draining amber yellow urine. Pure wick in place.  PIV to the left arm.  Pt has ileostomy to the right abdomen.  Z guard applied to her bottom, excoriation noted. Medications administered per MAR. Pills whole with water.Safety precautions maintained, call bell within reach. Hourly rounding performed. Vitals:Temperature: 98 ?F (36.7 ?C)Pulse: 78Respirations: 18Blood Pressure: 97/64Oxygen Saturation: 97 %

## 2023-09-21 LAB — CBC WITH AUTO DIFFERENTIAL
BKR WAM ABSOLUTE IMMATURE GRANULOCYTES.: 0.03 x 1000/ÂµL (ref 0.00–0.30)
BKR WAM ABSOLUTE LYMPHOCYTE COUNT.: 1.72 x 1000/ÂµL (ref 0.60–3.70)
BKR WAM ABSOLUTE NRBC (2 DEC): 0 x 1000/ÂµL (ref 0.00–1.00)
BKR WAM ANC (ABSOLUTE NEUTROPHIL COUNT): 4.5 x 1000/ÂµL (ref 2.00–7.60)
BKR WAM BASOPHIL ABSOLUTE COUNT.: 0.05 x 1000/ÂµL (ref 0.00–1.00)
BKR WAM BASOPHILS: 0.7 % (ref 0.0–1.4)
BKR WAM EOSINOPHIL ABSOLUTE COUNT.: 0.41 x 1000/ÂµL (ref 0.00–1.00)
BKR WAM EOSINOPHILS: 5.5 % — ABNORMAL HIGH (ref 0.0–5.0)
BKR WAM HEMATOCRIT (2 DEC): 29.9 % — ABNORMAL LOW (ref 35.00–45.00)
BKR WAM HEMOGLOBIN: 8.8 g/dL — ABNORMAL LOW (ref 11.7–15.5)
BKR WAM IMMATURE GRANULOCYTES: 0.4 % (ref 0.0–1.0)
BKR WAM LYMPHOCYTES: 23.1 % (ref 17.0–50.0)
BKR WAM MCH (PG): 26.7 pg — ABNORMAL LOW (ref 27.0–33.0)
BKR WAM MCHC: 29.4 g/dL — ABNORMAL LOW (ref 31.0–36.0)
BKR WAM MCV: 90.9 fL (ref 80.0–100.0)
BKR WAM MONOCYTE ABSOLUTE COUNT.: 0.74 x 1000/ÂµL (ref 0.00–1.00)
BKR WAM MONOCYTES: 9.9 % (ref 4.0–12.0)
BKR WAM MPV: 9.3 fL (ref 8.0–12.0)
BKR WAM NEUTROPHILS: 60.4 % (ref 39.0–72.0)
BKR WAM NUCLEATED RED BLOOD CELLS: 0 % (ref 0.0–1.0)
BKR WAM PLATELETS: 454 x1000/ÂµL — ABNORMAL HIGH (ref 150–420)
BKR WAM RDW-CV: 16 % — ABNORMAL HIGH (ref 11.0–15.0)
BKR WAM RED BLOOD CELL COUNT.: 3.29 M/ÂµL — ABNORMAL LOW (ref 4.00–6.00)
BKR WAM WHITE BLOOD CELL COUNT: 7.5 x1000/ÂµL (ref 4.0–11.0)

## 2023-09-21 LAB — COMPREHENSIVE METABOLIC PANEL
BKR A/G RATIO: 0.5
BKR ALANINE AMINOTRANSFERASE (ALT): 8 U/L — ABNORMAL LOW (ref 12–78)
BKR ALBUMIN: 2.9 g/dL — ABNORMAL LOW (ref 3.4–5.0)
BKR ALKALINE PHOSPHATASE: 78 U/L (ref 20–120)
BKR ANION GAP: 5 (ref 5–18)
BKR ASPARTATE AMINOTRANSFERASE (AST): 8 U/L (ref 5–37)
BKR AST/ALT RATIO: 1
BKR BILIRUBIN TOTAL: 0.2 mg/dL (ref 0.0–1.0)
BKR BLOOD UREA NITROGEN: 20 mg/dL (ref 8–25)
BKR BUN / CREAT RATIO: 23.5 (ref 8.0–25.0)
BKR CALCIUM: 10.4 mg/dL — ABNORMAL HIGH (ref 8.4–10.3)
BKR CHLORIDE: 108 mmol/L (ref 95–115)
BKR CO2: 28 mmol/L (ref 21–32)
BKR CREATININE: 0.85 mg/dL (ref 0.50–1.30)
BKR EGFR, CREATININE (CKD-EPI 2021): >60 mL/min/{1.73_m2} (ref >=60)
BKR GLOBULIN: 5.6 g/dL
BKR GLUCOSE: 84 mg/dL (ref 70–100)
BKR OSMOLALITY CALCULATION: 283 mosm/kg (ref 275–295)
BKR POTASSIUM: 4.1 mmol/L (ref 3.5–5.1)
BKR PROTEIN TOTAL: 8.5 g/dL — ABNORMAL HIGH (ref 6.4–8.2)
BKR SODIUM: 141 mmol/L (ref 136–145)

## 2023-09-21 LAB — URINE MICROSCOPIC     (BH GH LMW YH)
BKR RBC/HPF INSTRUMENT: 293 /HPF — ABNORMAL HIGH (ref 0–2)
BKR URINE SQUAMOUS EPITHELIAL CELLS, UA (NUMERIC): <1 /HPF (ref 0–5)
BKR WBC/HPF INSTRUMENT: 100 /HPF — ABNORMAL HIGH (ref 0–5)

## 2023-09-21 LAB — URINALYSIS WITH CULTURE REFLEX      (BH LMW YH)
BKR BILIRUBIN, UA: NEGATIVE
BKR GLUCOSE, UA: NEGATIVE
BKR KETONES, UA: NEGATIVE
BKR NITRITE, UA: NEGATIVE
BKR PH, UA: 6.5 (ref 5.5–7.5)
BKR SPECIFIC GRAVITY, UA: 1.014 (ref 1.005–1.030)
BKR UROBILINOGEN, UA (MG/DL): <2 mg/dL (ref <=2.0)

## 2023-09-21 LAB — UA REFLEX CULTURE

## 2023-09-21 MED ORDER — SODIUM CHLORIDE 0.9 % INTRAVENOUS SOLUTION
INTRAVENOUS | Status: AC
Start: 2023-09-21 — End: ?
  Administered 2023-09-24 – 2023-09-29 (×4): via INTRAVENOUS

## 2023-09-21 MED ORDER — SODIUM CHLORIDE 0.9 % INTRAVENOUS SOLUTION
INTRAVENOUS | Status: DC
Start: 2023-09-21 — End: 2023-09-25
  Administered 2023-09-21 – 2023-09-25 (×6): via INTRAVENOUS

## 2023-09-21 MED ORDER — POVIDONE-IODINE 10 % NASAL SWAB
10 | Freq: Two times a day (BID) | NASAL | Status: AC
Start: 2023-09-21 — End: ?
  Administered 2023-09-22 – 2023-09-30 (×17): 10 % via NASAL

## 2023-09-21 MED ORDER — ENOXAPARIN 60 MG/0.6 ML SUBCUTANEOUS SYRINGE
60 mg/0.6 mL | Freq: Two times a day (BID) | SUBCUTANEOUS | Status: DC
Start: 2023-09-21 — End: 2023-09-25
  Administered 2023-09-21 – 2023-09-23 (×5): 60 mg/0.6 mL via SUBCUTANEOUS

## 2023-09-21 MED ORDER — SODIUM CHLORIDE 0.9 % (FLUSH) INJECTION SYRINGE
0.9 | Freq: Two times a day (BID) | Status: AC
Start: 2023-09-21 — End: ?
  Administered 2023-09-24 – 2023-09-30 (×3): 0.9 mL

## 2023-09-21 MED ORDER — LIDOCAINE (PF) 10 MG/ML (1 %) INJECTION SOLUTION
10 | Freq: Once | INTRADERMAL | Status: CP
Start: 2023-09-21 — End: ?
  Administered 2023-09-21: 17:00:00 10 mL via INTRADERMAL

## 2023-09-21 MED ORDER — CHLORHEXIDINE GLUCONATE 4 % TOPICAL LIQUID
4 | Freq: Every day | TOPICAL | Status: AC
Start: 2023-09-21 — End: ?
  Administered 2023-09-21 – 2023-09-30 (×10): 4 mL via TOPICAL

## 2023-09-21 MED ORDER — SODIUM CHLORIDE 0.9 % (FLUSH) INJECTION SYRINGE
0.9 | Status: AC | PRN
Start: 2023-09-21 — End: ?
  Administered 2023-09-24 – 2023-09-30 (×6): 0.9 mL

## 2023-09-21 MED ORDER — SODIUM CHLORIDE 0.9 % (FLUSH) INJECTION SYRINGE
0.9 | Status: AC | PRN
Start: 2023-09-21 — End: ?
  Administered 2023-09-24 – 2023-09-30 (×5): 0.9 mL

## 2023-09-21 MED ORDER — SODIUM CHLORIDE 0.9 % INTRAVENOUS SOLUTION
INTRAVENOUS | Status: DC
Start: 2023-09-21 — End: 2023-09-22
  Administered 2023-09-21 (×2): via INTRAVENOUS

## 2023-09-21 NOTE — Progress Notes
 [facility] Medicine Progress NoteAttending Provider: Lyndee Leo, DO Subjective                                                                              Subjective: Interim History: no acute overnight events. Patient is doing okay, midline placed and eliquis transitioned to lovenox. Earlier in the day, colostomy leaked. BP on the softer side however close to baseline. Calcium found to be elevated, likely dehydration, will start IV fluid. Review of Allergies/Meds/Hx: Review of Allergies/Meds/Hx:I have reviewed the patient's: allergies, current scheduled medications, current infusions, current prn medications, past medical history, past surgical history, family history, social history, and prior to admission medications Objective Objective: Vitals:Last 24 hours: Temp:  [97.8 ?F (36.6 ?C)-98.3 ?F (36.8 ?C)] 97.8 ?F (36.6 ?C)Pulse:  [60-80] 71Resp:  [18] 18BP: (88-101)/(50-64) 89/50SpO2:  [99 %-100 %] 99 %I/O's:Gross Totals (Last 24 hours) at 09/21/2023 1942Last data filed at 09/21/2023 0930Intake -- Output 450 ml Net -450 ml Physical ExamConstitutional:     Appearance: Normal appearance. HENT:    Head: Normocephalic and atraumatic. Cardiovascular:    Rate and Rhythm: Normal rate and regular rhythm.    Pulses: Normal pulses.    Heart sounds: Normal heart sounds. Pulmonary:    Effort: Pulmonary effort is normal.    Breath sounds: Normal breath sounds. Abdominal:    General: Abdomen is flat. Bowel sounds are normal.    Palpations: Abdomen is soft.    Comments: Colostomy in place, leakage onto gown Musculoskeletal:       General: Normal range of motion. Skin:   General: Skin is warm and dry. Neurological:    Mental Status: She is alert.    Motor: Weakness present.  Labs:Last 24 hours: Recent Results (from the past 24 hour(s)) CBC auto differential  Collection Time: 09/21/23  8:13 AM Result Value Ref Range  WBC 7.5 4.0 - 11.0 x1000/?L  RBC 3.29 (L) 4.00 - 6.00 M/?L  Hemoglobin 8.8 (L) 11.7 - 15.5 g/dL  Hematocrit 30.86 (L) 57.84 - 45.00 %  MCV 90.9 80.0 - 100.0 fL  MCH 26.7 (L) 27.0 - 33.0 pg  MCHC 29.4 (L) 31.0 - 36.0 g/dL  RDW-CV 69.6 (H) 29.5 - 15.0 %  Platelets 454 (H) 150 - 420 x1000/?L  MPV 9.3 8.0 - 12.0 fL  Neutrophils 60.4 39.0 - 72.0 %  Lymphocytes 23.1 17.0 - 50.0 %  Monocytes 9.9 4.0 - 12.0 %  Eosinophils 5.5 (H) 0.0 - 5.0 %  Basophil 0.7 0.0 - 1.4 %  Immature Granulocytes 0.4 0.0 - 1.0 %  nRBC 0.0 0.0 - 1.0 %  Absolute Lymphocyte Count 1.72 0.60 - 3.70 x 1000/?L  Monocyte Absolute Count 0.74 0.00 - 1.00 x 1000/?L  Eosinophil Absolute Count 0.41 0.00 - 1.00 x 1000/?L  Basophil Absolute Count 0.05 0.00 - 1.00 x 1000/?L  Absolute Immature Granulocyte Count 0.03 0.00 - 0.30 x 1000/?L  Absolute nRBC 0.00 0.00 - 1.00 x 1000/?L  ANC (Abs Neutrophil Count) 4.50 2.00 - 7.60 x 1000/?L Comprehensive metabolic panel  Collection Time: 09/21/23  8:13 AM Result Value Ref Range  Sodium 141 136 - 145 mmol/L  Potassium 4.1 3.5 - 5.1 mmol/L  Chloride 108 95 - 115 mmol/L  CO2 28 21 - 32 mmol/L  Anion Gap 5 5 - 18  Glucose 84 70 - 100 mg/dL  BUN 20 8 - 25 mg/dL  Creatinine 1.61 0.96 - 1.30 mg/dL  Calcium 04.5 (H) 8.4 - 10.3 mg/dL  BUN/Creatinine Ratio 40.9 8.0 - 25.0  Total Protein 8.5 (H) 6.4 - 8.2 g/dL  Albumin 2.9 (L) 3.4 - 5.0 g/dL  Total Bilirubin 0.2 0.0 - 1.0 mg/dL  Alkaline Phosphatase 78 20 - 120 U/L  Alanine Aminotransferase (ALT) 8 (L) 12 - 78 U/L  Aspartate Aminotransferase (AST) 8 5 - 37 U/L  Globulin 5.6 g/dL  A/G Ratio 0.5   AST/ALT Ratio 1.0 Reference Range Not Established  Osmolality Calculation 283 275 - 295 mOsm/kg  eGFR (Creatinine) >60 >=60 mL/min/1.36m2 UA reflex to culture  Collection Time: 09/21/23 11:35 AM  Specimen: Straight Catheter; Urine Result Value Ref Range  Reflex Urine Culture See Comment  Urinalysis with culture reflex     ([facility] LM[facility])  Collection Time: 09/21/23 11:35 AM  Specimen: Straight Catheter; Urine Result Value Ref Range  Clarity, UA Cloudy (A) Clear  Color, UA Colorless Yellow, Colorless  Specific Gravity, UA 1.014 1.005 - 1.030  pH, UA 6.5 5.5 - 7.5  Protein, UA 1+ (A) Negative, Trace  Glucose, UA Negative Negative  Ketones, UA Negative Negative  Blood, UA 3+ (A) Negative  Bilirubin, UA Negative Negative  Leukocytes, UA 4+ (A) Negative  Nitrite, UA Negative Negative  Urobilinogen, UA <2.0 <=2.0 mg/dL Urine microscopic     ([facility])  Collection Time: 09/21/23 11:35 AM Result Value Ref Range  RBC/HPF, UA 293 (H) 0 - 2 /HPF  WBC/HPF, UA 100 (H) 0 - 5 /HPF  Bacteria, UA Rare None-Rare /HPF  Urine Squamous Epithelial Cells, UA <1 0 - 5 /HPF Diagnostics: Assessment Assessment: Assessment:Principal Problem:  Sepsis, due to unspecified organism, unspecified whether acute organ dysfunction present (HC Code) (HC CODE) (HC Code)  SNOMED [place](R): SEPSIS  Active Problems:  UTI (urinary tract infection)  SNOMED [place](R): URINARY TRACT INFECTIOUS DISEASE    Plan Plan: Patient is a 67 year old woman with PMHx of cognitive impairment, hx DVT, RA, 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 Patient seen by urology, pending left stent exchange on Thursday 10/3. Midline placed today 9/30, eliquis transitioned to lovenox for procedure.  # Sepsis secondary to acute pyeloureteritis with E coli (ESBL) bacteremia - 07/15/2023: right stent was removed  and left stent was advanced to the renal pelvis, small perforation seen, mild extravasation.- Has been having intermittent drops in blood pressures that have been fluid responsive. - ICU evaluation completed by Dr. Raina Mina on 9/24. - Urology note by Dr. Hulen Luster 9/24: left pyelonephritis excellent stent placement stone is not visualizedMaybe passHopefully this hospitalization early next week we can consider ureteroscopy see if the stone is still there and removal of stent- Blood cx with E coli ESBL . Urine cx the same- repeat blood culture 9/29- Continue ertapenem, day 7- pending next Thursday 10/3 ureteroscopy stent exchange destruction of stones- RCRI is zero which confers to 3.9% 30 day risk of death, MI or cardiac arrest: no history of MI,  recent TTE 02/2023 with normal EF, stable kidney function, no history of stroke, not on insulin- EKG and CXR unremarkable, poor functional status- patient is an intermediate risk patient for a low risk procedure, no further work up prior to OR # Acute on chronic anemia - stable, baseline appears 9-10.  Suspect partly dilutional, partly sepsis related. - Discussed risks/benefits of transfusion however patient declines transfusion.  She acknowledges that it is a big drop from 9 to 7 but doesn't want blood transfusion because of a bad reaction that her sister had from transfusion.  She does not want me to discuss further with her sister [name] Charity fundraiser).  - We left it at that if she were to develop over signs of bleeding or if Hb < 7 rediscussion would be had.   She agreed.  - ferrous gluconate 324mg  every other day- stool occult blood negative- Hg may trend downward tomorrow due to IV fluid today# Hypercalcemia- likely secondary to dehydration- started IV fluid- will monitor for now, if persistently elevated will need work up with PTH, phos, vitamin D # Hx DVT - eliquis switched to lovenox therapeutic today- will hold after Wednesday morning dose for Thursday procedure # Presence of ostomy - Routine ostomy care # Hypokalemia- replete # HLD- continue crestor 10mg  once daily Other issues stable Hx RAHx peripheral neuropathy - gabapentin dose reduced.  ====* Dysphagia diet, NDD3* DVT ppx: lovenox therapeutic* Full code Patient was seen and examined on 09/21/2023 Comorbidities Comorbidities present on admission:Coagulopathic due to prescription of anticoagulant medication prior to admission. Secondary diagnoses occurring during hospitalization:Hyponatremia Electronically Signed:Kenecia Barren, DO Beeper 22539/30/2024, 7:41 PM

## 2023-09-21 NOTE — Plan of Care
 Plan of Care Overview/ Patient Status    MIDLINE INSERTION NOTEMidline Catheter 09/21/23 1340 Double Lumen Right other (see comments) Open-ended catheter;Pressure injectable catheter 4 Fr (Active) Order received and documents reviewed. Informed consent from sister  was obtained for the procedure. Infection risk was discussed. Timeout procedure was performed.Inserted to right  brachial vein per hospital protocol using ultrasound guided technology.Inserted by m.Dolly Harbach.Inserted to brachial rua vein per hospital protocol using Maximum Barrier Protection , Ultrasound Guided Technology , and Modified Seldinger Technique (MST), With V.O.= 37%  Vein assessed at 0.7 cm depth. 1% Lidocaine intradermally to site prior to access with needle gauge 20G,1.75. 1 attempt. Access confirmed with ultrasound at the depth of 0.7 cm. Guidewire inserted without resistance. Needle removed. MST dilator/introducer advanced without resistance. Guidewire removed. End cap placed on the introducer. Trimmed catheter advanced without resistance. Blood return: yes.Catheter inserted to 13cm, with 0 cm exposed. Mid upper circumference is 24 cm. Catheter was flushed with 20 cc NS. Patient  did tolerate procedure well.All guidewires used during procedure were inspected pre and post and found to be intact and accounted for. Skin nick yes.Midline NOT to be use for vesicant medications or TPN. No IVP phenergan or dilantin.Midline eduction materials given to placed in chart with teaching instructions.Elba Barman, RN

## 2023-09-21 NOTE — Plan of Care
 Plan of Care Overview/ Patient Status    Received pt at bedside at 0730 with dual RN handoff. Pt is alert and oriented x3. Lungs CTA. Heart rate WDL. VSS on RA. Denies chest pain, SOB. Abdomen flat, non-tender. Tolerating diet. Denies nausea/vomiting/diarrhea. Pt has good appetite. Voiding spontaneously to the purewick- diaper in place. Ileostomy to RLQ- liquid stool. Pt denies numbness/tingling. Strong dorsi/plantar flexion. IV site CDI- heplocked. Patient denies pain at this time. Patient/Family acknowledge understanding of fall prevention education including to call nurse with assistance with ambulation. Bed in lowest position. Call bell and belongings within reach. Bed alarm on. Contact precautions maintained.  0902: Scheduled medications given, pt resting comfortably in bed. 1030: Bladder scan for 97 cc- will wait to straight cath, MD Cho aware. CHG bath done. 1230: Pt bladder scanned for 212 cc- per MD ok to straight cath for urine sample. Urine cultures sent to lab. Ileostomy appliance changed d/t leaking. Pt repositioned. 1515: Blood pressure 88/54- MD Cho aware, retaken. New orders placed and started IVF at 100 cc, pt asymptomatic. 1737: Scheduled meds given, pt comfortable eating dinner. Problem: Adult Inpatient Plan of CareGoal: Plan of Care ReviewOutcome: Interventions implemented as appropriateGoal: Patient-Specific Goal (Individualized)Outcome: Interventions implemented as appropriateGoal: Absence of Hospital-Acquired Illness or InjuryOutcome: Interventions implemented as appropriateGoal: Optimal Comfort and WellbeingOutcome: Interventions implemented as appropriateGoal: Readiness for Transition of CareOutcome: Interventions implemented as appropriate Problem: Violence Risk or ActualGoal: Anger and Impulse ControlOutcome: Interventions implemented as appropriate Problem: Diabetes ComorbidityGoal: Blood Glucose Level Within Targeted RangeOutcome: Interventions implemented as appropriate Problem: Fall Injury RiskGoal: Absence of Fall and Fall-Related InjuryOutcome: Interventions implemented as appropriate Problem: Wound Healing ProgressionGoal: Optimal Wound HealingOutcome: Interventions implemented as appropriate Problem: Skin Injury Risk IncreasedGoal: Skin Health and IntegrityOutcome: Interventions implemented as appropriate Problem: InfectionGoal: Absence of Infection Signs and SymptomsOutcome: Interventions implemented as appropriate

## 2023-09-21 NOTE — Plan of Care
 Plan of Care Overview/ Patient Status    Pt is AAOx3, Disoriented to time, on RA saturating at 98%. No SOB or acute respiratory distress noted. No complaints of pain. Bp soft at 94/59. Pt denies any dizziness or lightheaded. Denies CP, headache. Asymptomatic. draining amber yellow urine. Pure wick in place.  PIV to the left arm.  Pt has ileostomy to the right abdomen.  Z guard applied to her bottom, excoriation noted. Medications administered per MAR. Pills whole with water.  Safety precautions maintained, call bell within reach. Hourly rounding performed. Vitals:Temperature: 98.3 ?F (36.8 ?C)Pulse: (!) 95Respirations: 18Blood Pressure: (!) 94/59Oxygen Saturation: 98 %

## 2023-09-22 ENCOUNTER — Encounter: Admit: 2023-09-22 | Payer: MEDICARE | Attending: Urology | Primary: Nephrology

## 2023-09-22 ENCOUNTER — Ambulatory Visit: Admit: 2023-09-22 | Payer: MEDICARE | Primary: Nephrology

## 2023-09-22 ENCOUNTER — Ambulatory Visit: Admit: 2023-09-22 | Payer: MEDICARE | Attending: Medical Oncology | Primary: Nephrology

## 2023-09-22 LAB — COMPREHENSIVE METABOLIC PANEL
BKR A/G RATIO: 0.5
BKR ALANINE AMINOTRANSFERASE (ALT): <6 U/L — ABNORMAL LOW (ref 12–78)
BKR ALBUMIN: 2.5 g/dL — ABNORMAL LOW (ref 3.4–5.0)
BKR ALKALINE PHOSPHATASE: 70 U/L (ref 20–120)
BKR ANION GAP: 5 (ref 5–18)
BKR ASPARTATE AMINOTRANSFERASE (AST): 9 U/L (ref 5–37)
BKR BILIRUBIN TOTAL: 0.1 mg/dL (ref 0.0–1.0)
BKR BLOOD UREA NITROGEN: 20 mg/dL (ref 8–25)
BKR BUN / CREAT RATIO: 27 — ABNORMAL HIGH (ref 8.0–25.0)
BKR CALCIUM: 9.5 mg/dL (ref 8.4–10.3)
BKR CHLORIDE: 115 mmol/L (ref 95–115)
BKR CO2: 23 mmol/L (ref 21–32)
BKR CREATININE: 0.74 mg/dL (ref 0.50–1.30)
BKR EGFR, CREATININE (CKD-EPI 2021): >60 mL/min/{1.73_m2} (ref >=60)
BKR GLOBULIN: 4.8 g/dL
BKR GLUCOSE: 95 mg/dL (ref 70–100)
BKR OSMOLALITY CALCULATION: 287 mosm/kg (ref 275–295)
BKR POTASSIUM: 3.7 mmol/L (ref 3.5–5.1)
BKR PROTEIN TOTAL: 7.3 g/dL (ref 6.4–8.2)
BKR SODIUM: 143 mmol/L (ref 136–145)

## 2023-09-22 LAB — CBC WITH AUTO DIFFERENTIAL
BKR WAM ABSOLUTE IMMATURE GRANULOCYTES.: 0.03 x 1000/ÂµL (ref 0.00–0.30)
BKR WAM ABSOLUTE LYMPHOCYTE COUNT.: 2.08 x 1000/ÂµL (ref 0.60–3.70)
BKR WAM ABSOLUTE NRBC (2 DEC): 0 x 1000/ÂµL (ref 0.00–1.00)
BKR WAM ANC (ABSOLUTE NEUTROPHIL COUNT): 4.25 x 1000/ÂµL (ref 2.00–7.60)
BKR WAM BASOPHIL ABSOLUTE COUNT.: 0.04 x 1000/ÂµL (ref 0.00–1.00)
BKR WAM BASOPHILS: 0.5 % (ref 0.0–1.4)
BKR WAM EOSINOPHIL ABSOLUTE COUNT.: 0.38 x 1000/ÂµL (ref 0.00–1.00)
BKR WAM EOSINOPHILS: 5.1 % — ABNORMAL HIGH (ref 0.0–5.0)
BKR WAM HEMATOCRIT (2 DEC): 24.7 % — ABNORMAL LOW (ref 35.00–45.00)
BKR WAM HEMOGLOBIN: 7.4 g/dL — ABNORMAL LOW (ref 11.7–15.5)
BKR WAM IMMATURE GRANULOCYTES: 0.4 % (ref 0.0–1.0)
BKR WAM LYMPHOCYTES: 27.8 % (ref 17.0–50.0)
BKR WAM MCH (PG): 27.4 pg (ref 27.0–33.0)
BKR WAM MCHC: 30 g/dL — ABNORMAL LOW (ref 31.0–36.0)
BKR WAM MCV: 91.5 fL (ref 80.0–100.0)
BKR WAM MONOCYTE ABSOLUTE COUNT.: 0.7 x 1000/ÂµL (ref 0.00–1.00)
BKR WAM MONOCYTES: 9.4 % (ref 4.0–12.0)
BKR WAM MPV: 9.3 fL (ref 8.0–12.0)
BKR WAM NEUTROPHILS: 56.8 % (ref 39.0–72.0)
BKR WAM NUCLEATED RED BLOOD CELLS: 0 % (ref 0.0–1.0)
BKR WAM PLATELETS: 418 x1000/ÂµL (ref 150–420)
BKR WAM RDW-CV: 16.5 % — ABNORMAL HIGH (ref 11.0–15.0)
BKR WAM RED BLOOD CELL COUNT.: 2.7 M/ÂµL — ABNORMAL LOW (ref 4.00–6.00)
BKR WAM WHITE BLOOD CELL COUNT: 7.5 x1000/ÂµL (ref 4.0–11.0)

## 2023-09-22 LAB — URINE CULTURE: BKR URINE CULTURE, ROUTINE: NO GROWTH

## 2023-09-22 LAB — LACTIC ACID, PLASMA: BKR LACTATE: 1.2 mmol/L (ref 0.4–2.0)

## 2023-09-22 MED ORDER — SODIUM CHLORIDE 0.9 % IV BOLUS FROM BAG
0.9 | INTRAVENOUS | Status: CP
Start: 2023-09-22 — End: ?
  Administered 2023-09-22: 06:00:00 0.9 mL/h via INTRAVENOUS

## 2023-09-22 MED ORDER — LACTATED RINGERS IV BOLUS (NEW BAG)
Freq: Once | INTRAVENOUS | Status: CP
Start: 2023-09-22 — End: ?
  Administered 2023-09-22: 07:00:00 1000.000 mL/h via INTRAVENOUS

## 2023-09-22 NOTE — Significant Event
 Bedside evaluation for Hypotension.  2 l Fluid resuscitation, BP in  high 90s.  Patient denies any pain, states feel good. She appears little pale. Labs obtained, HH low but has been low in 7th range since 9/15. Patient has refused transfusion.Lactic is negative. She is finishing up Etapenem for GNR  Bacteremia, UTI, ESBL. No hematemesis, hematochezia or black stool via Ostomy.Monitor Vitals, [place] WBC, Renal function , HH, Transfusion as needed if she allows.

## 2023-09-22 NOTE — Progress Notes
 [facility] Medicine Progress NoteAttending Provider: Lyndee Leo, DO Subjective                                                                              Subjective: Interim History: Overnight patient had hypotension lower than her baseline. She received IV fluid and labs stable. Patient feeling well this morning. Patient states she wishes to try to walk again. Wants to see PT again. Review of Allergies/Meds/Hx: Review of Allergies/Meds/Hx:I have reviewed the patient's: allergies, current scheduled medications, current infusions, current prn medications, past medical history, past surgical history, family history, social history, and prior to admission medications Objective Objective: Vitals:Last 24 hours: Temp:  [97.4 ?F (36.3 ?C)-98 ?F (36.7 ?C)] 97.4 ?F (36.3 ?C)Pulse:  [57-69] 69Resp:  [16-20] 20BP: (78-103)/(41-63) 86/52SpO2:  [98 %-100 %] 100 %I/O's:Gross Totals (Last 24 hours) at 09/22/2023 1628Last data filed at 09/22/2023 0800Intake 200 ml Output 100 ml Net 100 ml Physical ExamConstitutional:     Appearance: Normal appearance. HENT:    Head: Normocephalic and atraumatic. Cardiovascular:    Rate and Rhythm: Normal rate and regular rhythm.    Pulses: Normal pulses.    Heart sounds: Normal heart sounds. No murmur heard.Pulmonary:    Effort: Pulmonary effort is normal.    Breath sounds: Normal breath sounds. Abdominal:    General: Abdomen is flat. Bowel sounds are normal.    Palpations: Abdomen is soft.    Comments: Right colostomy in place, bandage on prior PEG tube site non healing, no drainage Musculoskeletal:       General: midline in place right armSkin:   General: Skin is warm and dry. Neurological:    Mental Status: She is alert and oriented to person, place, and time.    Comments: Lower extremity motor strength 2/5   Labs:Last 24 hours: Recent Results (from the past 24 hour(s)) CBC auto differential  Collection Time: 09/22/23  2:24 AM Result Value Ref Range  WBC 7.5 4.0 - 11.0 x1000/?L  RBC 2.70 (L) 4.00 - 6.00 M/?L  Hemoglobin 7.4 (L) 11.7 - 15.5 g/dL  Hematocrit 41.32 (L) 44.01 - 45.00 %  MCV 91.5 80.0 - 100.0 fL  MCH 27.4 27.0 - 33.0 pg  MCHC 30.0 (L) 31.0 - 36.0 g/dL  RDW-CV 02.7 (H) 25.3 - 15.0 %  Platelets 418 150 - 420 x1000/?L  MPV 9.3 8.0 - 12.0 fL  Neutrophils 56.8 39.0 - 72.0 %  Lymphocytes 27.8 17.0 - 50.0 %  Monocytes 9.4 4.0 - 12.0 %  Eosinophils 5.1 (H) 0.0 - 5.0 %  Basophil 0.5 0.0 - 1.4 %  Immature Granulocytes 0.4 0.0 - 1.0 %  nRBC 0.0 0.0 - 1.0 %  Absolute Lymphocyte Count 2.08 0.60 - 3.70 x 1000/?L  Monocyte Absolute Count 0.70 0.00 - 1.00 x 1000/?L  Eosinophil Absolute Count 0.38 0.00 - 1.00 x 1000/?L  Basophil Absolute Count 0.04 0.00 - 1.00 x 1000/?L  Absolute Immature Granulocyte Count 0.03 0.00 - 0.30 x 1000/?L  Absolute nRBC 0.00 0.00 - 1.00 x 1000/?L  ANC (Abs Neutrophil Count) 4.25 2.00 - 7.60 x 1000/?L Comprehensive metabolic panel  Collection Time: 09/22/23  2:24 AM Result Value Ref Range  Sodium 143 136 - 145 mmol/L  Potassium 3.7 3.5 - 5.1 mmol/L  Chloride 115 95 - 115 mmol/L  CO2 23 21 - 32 mmol/L  Anion Gap 5 5 - 18  Glucose 95 70 - 100 mg/dL  BUN 20 8 - 25 mg/dL  Creatinine 6.21 3.08 - 1.30 mg/dL  Calcium 9.5 8.4 - 65.7 mg/dL  BUN/Creatinine Ratio 84.6 (H) 8.0 - 25.0  Total Protein 7.3 6.4 - 8.2 g/dL  Albumin 2.5 (L) 3.4 - 5.0 g/dL  Total Bilirubin 0.1 0.0 - 1.0 mg/dL  Alkaline Phosphatase 70 20 - 120 U/L  Alanine Aminotransferase (ALT) <6 (L) 12 - 78 U/L  Aspartate Aminotransferase (AST) 9 5 - 37 U/L  Globulin 4.8 g/dL  A/G Ratio 0.5   AST/ALT Ratio    Osmolality Calculation 287 275 - 295 mOsm/kg  eGFR (Creatinine) >60 >=60 mL/min/1.56m2 Lactic acid, plasma  Collection Time: 09/22/23  2:25 AM Result Value Ref Range  Lactate 1.2 0.4 - 2.0 mmol/L Diagnostics: Assessment Assessment: Assessment:Principal Problem:  Sepsis, due to unspecified organism, unspecified whether acute organ dysfunction present (HC Code) (HC CODE) (HC Code)  SNOMED [place] (R): SEPSIS  Active Problems:  UTI (urinary tract infection)  SNOMED [place](R): URINARY TRACT INFECTIOUS DISEASE    Plan Plan: Patient is a 67 year old woman with PMHx of cognitive impairment, hx DVT, RA, 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 Patient seen by urology, pending left stent exchange on Thursday 10/3. Midline placed today 9/30, eliquis transitioned to lovenox for procedure.  # Sepsis secondary to acute pyeloureteritis with E coli (ESBL) bacteremia - 07/15/2023: right stent was removed  and left stent was advanced to the renal pelvis, small perforation seen, mild extravasation.- Has been having intermittent drops in blood pressures that have been fluid responsive. - ICU evaluation completed by Dr. Raina Mina on 9/24. - Urology note by Dr. Hulen Luster 9/24: left pyelonephritis excellent stent placement stone is not visualizedMaybe passHopefully this hospitalization early next week we can consider ureteroscopy see if the stone is still there and removal of stent- Blood cx with E coli ESBL . Urine cx the same- repeat blood culture 9/29- Continue ertapenem, day 8- pending next Thursday 10/3 ureteroscopy stent exchange destruction of stones- RCRI is zero which confers to 3.9% 30 day risk of death, MI or cardiac arrest: no history of MI, recent TTE 02/2023 with normal EF, stable kidney function, no history of stroke, not on insulin- EKG and CXR unremarkable, poor functional status- patient is an intermediate risk patient for a low risk procedure, no further work up prior to OR # Acute on chronic anemia - stable, baseline appears 9-10.  Suspect partly dilutional, partly sepsis related. - Discussed risks/benefits of transfusion however patient declines transfusion.  She acknowledges that it is a big drop from 9 to 7 but doesn't want blood transfusion because of a bad reaction that her sister had from transfusion.  She does not want me to discuss further with her sister [name] Charity fundraiser).  - We left it at that if she were to develop over signs of bleeding or if Hb < 7 rediscussion would be had.   She agreed.  - ferrous gluconate 324mg  every other day- stool  occult blood negative- Hg may trend downward due to IV fluid  # Hypercalcemia- improved with hydration, will monitor- likely secondary to dehydration- will monitor for now, if persistently elevated will need work up with PTH, phos, vitamin D # Hx DVT - eliquis switched to lovenox therapeutic- will hold after Wednesday morning dose for Thursday procedure # Presence of ostomy - Routine ostomy care # Hypokalemia- replete # HLD- continue crestor 10mg  once daily Other issues stable Hx RAHx peripheral neuropathy - gabapentin dose reduced.  ====* Dysphagia diet, NDD3* DVT ppx: lovenox therapeutic* Full code Patient was seen and examined on 09/22/2023 Comorbidities Comorbidities present on admission:Coagulopathic due to prescription of anticoagulant medication prior to admission. Secondary diagnoses occurring during hospitalization:Hyponatremia Electronically Signed:Ikey Omary, DO Beeper 225310/12/2022, 4:27 PM

## 2023-09-22 NOTE — Plan of Care
 Plan of Care Overview/ Patient Status    Assumed at 0700. Pt is A&O x3. Mood is pleasant, cooperative w/ care. Blood pressure low, MD notified IV fluid rate increased.  Afebrile. Denies N/V/D, pain and SOB. Dependent for ADLs. Ostomy wafer and bad changed. Barrier cream applied. All meds given per [facility]. Turned and repositioned. IV fluids infusing and asymptomatic. Bed alarm on. Educated about fall/injury risks and to call for assistance. Verbalizes understanding. Call bell within reach. Lowest position. Safety precautions maintained. Vital Sign History Chronological  09/22/2023   8:29 AM 09/22/2023  12:35 PM 09/22/2023   2:50 PM Vital Signs Temp 98 ?F (36.7 ?C) 97.6 ?F (36.4 ?C) 97.4 ?F (36.3 ?C) Temp src Oral Oral Oral Pulse 62 57 69 BP 92/54 92/60 86/52  Resp 18 16 20  SpO2 99 % 99 % 100 % Problem: Diabetes ComorbidityGoal: Blood Glucose Level Within Targeted RangeOutcome: Interventions implemented as appropriate Problem: Violence Risk or ActualGoal: Anger and Impulse ControlOutcome: Interventions implemented as appropriate Problem: Adult Inpatient Plan of CareGoal: Plan of Care ReviewOutcome: Interventions implemented as appropriateGoal: Patient-Specific Goal (Individualized)Outcome: Interventions implemented as appropriateGoal: Absence of Hospital-Acquired Illness or InjuryOutcome: Interventions implemented as appropriateGoal: Optimal Comfort and WellbeingOutcome: Interventions implemented as appropriateGoal: Readiness for Transition of CareOutcome: Interventions implemented as appropriate Problem: Fall Injury RiskGoal: Absence of Fall and Fall-Related InjuryOutcome: Interventions implemented as appropriate Problem: Wound Healing ProgressionGoal: Optimal Wound HealingOutcome: Interventions implemented as appropriate Problem: Skin Injury Risk IncreasedGoal: Skin Health and IntegrityOutcome: Interventions implemented as appropriate Problem: InfectionGoal: Absence of Infection Signs and SymptomsOutcome: Interventions implemented as appropriate Problem: Physical Therapy GoalsGoal: Physical Therapy GoalsDescription: 1) bed mob w/ minx12) txfrs w/ RW mod a x2Outcome: Interventions implemented as appropriate

## 2023-09-22 NOTE — Plan of Care
 Problem: Diabetes ComorbidityGoal: Blood Glucose Level Within Targeted RangeOutcome: Interventions implemented as appropriate Problem: Violence Risk or ActualGoal: Anger and Impulse ControlOutcome: Interventions implemented as appropriate Problem: Adult Inpatient Plan of CareGoal: Plan of Care ReviewOutcome: Interventions implemented as appropriateGoal: Patient-Specific Goal (Individualized)Outcome: Interventions implemented as appropriateGoal: Absence of Hospital-Acquired Illness or InjuryOutcome: Interventions implemented as appropriateGoal: Optimal Comfort and WellbeingOutcome: Interventions implemented as appropriateGoal: Readiness for Transition of CareOutcome: Interventions implemented as appropriate Problem: Fall Injury RiskGoal: Absence of Fall and Fall-Related InjuryOutcome: Interventions implemented as appropriate Problem: Wound Healing ProgressionGoal: Optimal Wound HealingOutcome: Interventions implemented as appropriate Problem: Skin Injury Risk IncreasedGoal: Skin Health and IntegrityOutcome: Interventions implemented as appropriate Problem: InfectionGoal: Absence of Infection Signs and SymptomsOutcome: Interventions implemented as appropriate Problem: Physical Therapy GoalsGoal: Physical Therapy GoalsDescription: 1) bed mob w/ minx12) txfrs w/ RW mod a x2Outcome: Interventions implemented as appropriate Plan of Care Overview/ Patient Status    Assumed care at 1900. Isolation precautions maintained. Alert and oriented. Room air. R midline. Fluids infusing. Ileostomy bag with dark brown mushy output. Ileostomy leaking upon initial assessment. Appliance changed. Pills tolerated whole. Denies pain. Urinary urgency. Purewick in place. Yellow output. Patient's BP 78/48. Manual result 82/41. Below patient's normal. Patient asymptomatic. Covering provider made aware. Order for 1 L bolus and labs to be collected. Phlebotomist made aware and blood work collected. BO 89/45. Covering provider made aware. Order for 1 L bolus of LR. Patient BP improved and remaining above 90 systolic. Per covering provider, decrease continuous rate to 25 cc/hr.

## 2023-09-23 ENCOUNTER — Inpatient Hospital Stay: Admit: 2023-09-23 | Payer: MEDICARE

## 2023-09-23 ENCOUNTER — Encounter: Admit: 2023-09-23 | Payer: MEDICARE | Attending: Anesthesiology | Primary: Nephrology

## 2023-09-23 LAB — COMPREHENSIVE METABOLIC PANEL
BKR A/G RATIO: 0.5
BKR ALANINE AMINOTRANSFERASE (ALT): 10 U/L — ABNORMAL LOW (ref 12–78)
BKR ALBUMIN: 2.3 g/dL — ABNORMAL LOW (ref 3.4–5.0)
BKR ALKALINE PHOSPHATASE: 64 U/L (ref 20–120)
BKR ANION GAP: 0 — ABNORMAL LOW (ref 5–18)
BKR ASPARTATE AMINOTRANSFERASE (AST): 19 U/L (ref 5–37)
BKR AST/ALT RATIO: 1.9
BKR BILIRUBIN TOTAL: 0.2 mg/dL (ref 0.0–1.0)
BKR BLOOD UREA NITROGEN: 13 mg/dL (ref 8–25)
BKR BUN / CREAT RATIO: 18.3 (ref 8.0–25.0)
BKR CALCIUM: 9.9 mg/dL (ref 8.4–10.3)
BKR CHLORIDE: 121 mmol/L — ABNORMAL HIGH (ref 95–115)
BKR CO2: 22 mmol/L (ref 21–32)
BKR CREATININE: 0.71 mg/dL (ref 0.50–1.30)
BKR EGFR, CREATININE (CKD-EPI 2021): >60 mL/min/{1.73_m2} (ref >=60)
BKR GLOBULIN: 4.5 g/dL
BKR GLUCOSE: 86 mg/dL (ref 70–100)
BKR OSMOLALITY CALCULATION: 284 mosm/kg (ref 275–295)
BKR POTASSIUM: 4 mmol/L (ref 3.5–5.1)
BKR PROTEIN TOTAL: 6.8 g/dL (ref 6.4–8.2)
BKR SODIUM: 143 mmol/L (ref 136–145)

## 2023-09-23 LAB — CBC WITH AUTO DIFFERENTIAL
BKR WAM ABSOLUTE IMMATURE GRANULOCYTES.: 0.02 x 1000/ÂµL (ref 0.00–0.30)
BKR WAM ABSOLUTE LYMPHOCYTE COUNT.: 1.42 x 1000/ÂµL (ref 0.60–3.70)
BKR WAM ABSOLUTE NRBC (2 DEC): 0 x 1000/ÂµL (ref 0.00–1.00)
BKR WAM ANC (ABSOLUTE NEUTROPHIL COUNT): 2.61 x 1000/ÂµL (ref 2.00–7.60)
BKR WAM BASOPHIL ABSOLUTE COUNT.: 0.04 x 1000/ÂµL (ref 0.00–1.00)
BKR WAM BASOPHILS: 0.8 % (ref 0.0–1.4)
BKR WAM EOSINOPHIL ABSOLUTE COUNT.: 0.32 x 1000/ÂµL (ref 0.00–1.00)
BKR WAM EOSINOPHILS: 6.5 % — ABNORMAL HIGH (ref 0.0–5.0)
BKR WAM HEMATOCRIT (2 DEC): 26.6 % — ABNORMAL LOW (ref 35.00–45.00)
BKR WAM HEMOGLOBIN: 8 g/dL — ABNORMAL LOW (ref 11.7–15.5)
BKR WAM IMMATURE GRANULOCYTES: 0.4 % (ref 0.0–1.0)
BKR WAM LYMPHOCYTES: 28.8 % (ref 17.0–50.0)
BKR WAM MCH (PG): 27.5 pg (ref 27.0–33.0)
BKR WAM MCHC: 30.1 g/dL — ABNORMAL LOW (ref 31.0–36.0)
BKR WAM MCV: 91.4 fL (ref 80.0–100.0)
BKR WAM MONOCYTE ABSOLUTE COUNT.: 0.52 x 1000/ÂµL (ref 0.00–1.00)
BKR WAM MONOCYTES: 10.5 % (ref 4.0–12.0)
BKR WAM MPV: 9.3 fL (ref 8.0–12.0)
BKR WAM NEUTROPHILS: 53 % (ref 39.0–72.0)
BKR WAM NUCLEATED RED BLOOD CELLS: 0 % (ref 0.0–1.0)
BKR WAM PLATELETS: 417 x1000/ÂµL (ref 150–420)
BKR WAM RDW-CV: 16.3 % — ABNORMAL HIGH (ref 11.0–15.0)
BKR WAM RED BLOOD CELL COUNT.: 2.91 M/ÂµL — ABNORMAL LOW (ref 4.00–6.00)
BKR WAM WHITE BLOOD CELL COUNT: 4.9 x1000/ÂµL (ref 4.0–11.0)

## 2023-09-23 LAB — CORTISOL: BKR CORTISOL, PLASMA: 16.5 ug/dL

## 2023-09-23 NOTE — Progress Notes
 [facility] Medicine Progress NoteAttending Provider: Lyndee Leo, DO Subjective                                                                              Subjective: Interim History: no acute overnight events. Patient overall feeling well, attempted to get her OOB to chair however patient was unable to stand. Patient brought back into bed. Pending OR tomorrow. BP and HR on the lower chronically, cortisol checked and in appropriate limits. Review of Allergies/Meds/Hx: Review of Allergies/Meds/Hx:I have reviewed the patient's: allergies, current scheduled medications, current infusions, current prn medications, past medical history, past surgical history, family history, social history, and prior to admission medications Objective Objective: Vitals:Last 24 hours: Temp:  [97.2 ?F (36.2 ?C)-98.4 ?F (36.9 ?C)] 97.2 ?F (36.2 ?C)Pulse:  [53-62] 58Resp:  [18-20] 20BP: (90-107)/(54-71) 107/71SpO2:  [96 %-100 %] 100 %I/O's:Gross Totals (Last 24 hours) at 09/23/2023 1602Last data filed at 09/22/2023 1709Intake 762.42 ml Output -- Net 762.42 ml Physical ExamConstitutional:     Appearance: Normal appearance. HENT:    Head: Normocephalic and atraumatic. Cardiovascular:    Rate and Rhythm: Normal rate and regular rhythm.    Pulses: Normal pulses.    Heart sounds: Normal heart sounds. No murmur heard.Pulmonary:    Effort: Pulmonary effort is normal.    Breath sounds: Normal breath sounds. Abdominal:    General: Abdomen is flat. Bowel sounds are normal.    Palpations: Abdomen is soft.    Comments: Right colostomy in place, bandage on prior PEG tube site non healing, no drainage Musculoskeletal:       General: midline in place right armSkin:   General: Skin is warm and dry. Neurological:    Mental Status: She is alert and oriented to person, place, and time.    Comments: Lower extremity motor strength 2/5 Labs:Last 24 hours: Recent Results (from the past 24 hour(s)) Comprehensive metabolic panel  Collection Time: 09/23/23  7:49 AM Result Value Ref Range  Sodium 143 136 - 145 mmol/L  Potassium 4.0 3.5 - 5.1 mmol/L  Chloride 121 (H) 95 - 115 mmol/L  CO2 22 21 - 32 mmol/L  Anion Gap 0 (L) 5 - 18  Glucose 86 70 - 100 mg/dL  BUN 13 8 - 25 mg/dL  Creatinine 5.78 4.69 - 1.30 mg/dL  Calcium 9.9 8.4 - 62.9 mg/dL  BUN/Creatinine Ratio 52.8 8.0 - 25.0  Total Protein 6.8 6.4 - 8.2 g/dL  Albumin 2.3 (L) 3.4 - 5.0 g/dL  Total Bilirubin 0.2 0.0 - 1.0 mg/dL  Alkaline Phosphatase 64 20 - 120 U/L  Alanine Aminotransferase (ALT) 10 (L) 12 - 78 U/L  Aspartate Aminotransferase (AST) 19 5 - 37 U/L  Globulin 4.5 g/dL  A/G Ratio 0.5   AST/ALT Ratio 1.9 Reference Range Not Established  Osmolality Calculation 284 275 - 295 mOsm/kg  eGFR (Creatinine) >60 >=60 mL/min/1.33m2 Cortisol  Collection Time: 09/23/23  7:49 AM Result Value Ref Range  Cortisol 16.5 See Comment ug/dL CBC auto differential  Collection Time: 09/23/23  8:30 AM Result Value Ref Range  WBC 4.9 4.0 - 11.0 x1000/?L  RBC 2.91 (L) 4.00 - 6.00 M/?L  Hemoglobin 8.0 (L) 11.7 - 15.5 g/dL  Hematocrit 41.32 (L) 44.01 -  45.00 %  MCV 91.4 80.0 - 100.0 fL  MCH 27.5 27.0 - 33.0 pg  MCHC 30.1 (L) 31.0 - 36.0 g/dL  RDW-CV 16.1 (H) 09.6 - 15.0 %  Platelets 417 150 - 420 x1000/?L  MPV 9.3 8.0 - 12.0 fL  Neutrophils 53.0 39.0 - 72.0 %  Lymphocytes 28.8 17.0 - 50.0 %  Monocytes 10.5 4.0 - 12.0 %  Eosinophils 6.5 (H) 0.0 - 5.0 %  Basophil 0.8 0.0 - 1.4 %  Immature Granulocytes 0.4 0.0 - 1.0 %  nRBC 0.0 0.0 - 1.0 %  Absolute Lymphocyte Count 1.42 0.60 - 3.70 x 1000/?L  Monocyte Absolute Count 0.52 0.00 - 1.00 x 1000/?L  Eosinophil Absolute Count 0.32 0.00 - 1.00 x 1000/?L  Basophil Absolute Count 0.04 0.00 - 1.00 x 1000/?L  Absolute Immature Granulocyte Count 0.02 0.00 - 0.30 x 1000/?L Absolute nRBC 0.00 0.00 - 1.00 x 1000/?L  ANC (Abs Neutrophil Count) 2.61 2.00 - 7.60 x 1000/?L Type and screen  Collection Time: 09/23/23  2:30 PM Result Value Ref Range  ABO Grouping B   Rh Type POS   Antibody Screen NEG   Specimen Expiration Date And Time 09/26/2023 23:59  Diagnostics: Assessment Assessment: Assessment:Principal Problem:  Sepsis, due to unspecified organism, unspecified whether acute organ dysfunction present (HC Code) (HC CODE) (HC Code)  SNOMED [place](R): SEPSIS  Active Problems:  UTI (urinary tract infection)  SNOMED [place](R): URINARY TRACT INFECTIOUS DISEASE    Plan Plan: Patient is a 67 year old woman with PMHx of cognitive impairment, hx DVT, RA, 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 Patient seen by urology, pending left stent exchange on Thursday 10/3. Midline placed 9/30, eliquis transitioned to lovenox for procedure.  # Sepsis secondary to acute pyeloureteritis with E coli (ESBL) bacteremia - 07/15/2023: right stent was removed  and left stent was advanced to the renal pelvis, small perforation seen, mild extravasation.- Has been having intermittent drops in blood pressures that have been fluid responsive. - ICU evaluation completed by Dr. Raina Mina on 9/24. - Urology note by Dr. Hulen Luster 9/24: left pyelonephritis excellent stent placement stone is not visualizedMaybe passHopefully this hospitalization early next week we can consider ureteroscopy see if the stone is still there and removal of stent- Blood cx with E coli ESBL . Urine cx the same- repeat blood culture 9/29- Continue ertapenem, day 9- pending Thursday 10/3 ureteroscopy stent exchange destruction of stones- RCRI is zero which confers to 3.9% 30 day risk of death, MI or cardiac arrest: no history of MI, recent TTE 02/2023 with normal EF, stable kidney function, no history of stroke, not on insulin- EKG and CXR unremarkable, poor functional status- patient is an intermediate risk patient for a low risk procedure, no further work up prior to OR # Acute on chronic anemia - stable, baseline appears 9-10.  Suspect partly dilutional, partly sepsis related. - Discussed risks/benefits of transfusion however patient declines transfusion.  She acknowledges that it is a big drop from 9 to 7 but doesn't want blood transfusion because of a bad reaction that her sister had from transfusion.  She does not want me to discuss further with her sister  [name] Charity fundraiser).  - We left it at that if she were to develop over signs of bleeding or if Hb < 7 rediscussion would be had.   She agreed.  - ferrous gluconate 324mg  every other day- stool occult blood negative- Hg may trend downward due to IV fluid  # Hypercalcemia- improved with hydration, will monitor- likely secondary to dehydration- will monitor for now, if persistently elevated will need work up with PTH, phos, vitamin D # Hx DVT - eliquis switched to lovenox therapeutic, lovenox held for procedure tomorrow # Presence of ostomy - Routine ostomy care # Hypokalemia- replete # HLD- continue crestor 10mg  once daily Other issues stable Hx RAHx peripheral neuropathy - gabapentin dose reduced.  ====* Dysphagia diet, NDD3, NPO MN* DVT ppx: lovenox therapeutic held for OR tomorrow* Full code Patient was seen and examined on 09/23/2023 Comorbidities Comorbidities present on admission:Coagulopathic due to prescription of anticoagulant medication prior to admission. Secondary diagnoses occurring during hospitalization:Hyponatremia Electronically Signed:Ardath Lepak, DO Beeper 225310/01/2023, 4:01 PM

## 2023-09-23 NOTE — Other
 Malnutrition IdentificationNutrition DiagnosisNutrition Diagnosis/Problem: Severe MalnutritionRelated to: decreased appetiteAs Evidenced by: 25-50% intake documented with observed muscle wasting.Malnutrition Diagnosis - Adult: Severe MalnutritionRegistered dietitian has identified this patient as malnourished based on the following criteria:Severe Malnutrition:Energy Intake:  Acute Illness/Injury: Less than or equal to 50% for greater than or equal to 5 days  Muscle Mass: Acute Illness: Moderate Depletion (temple region (temporalis muscle))  (clavicle bone region (trapezius, supraspinatus, infraspinatus muscles)) shoulder and acromion process region (deltoid muscle) dorsal hand (interosseous muscles))Registered Dietitian: Albertina Parr, MS RD CNSC CDN, September 23, 2023

## 2023-09-23 NOTE — Plan of Care
 Plan of Care Overview/ Patient Status    Nutrition Follow up Assessment:Chart reviewed, events noted. Pt admitted with sepsis r/t UTI. Awaiting stent exchange tomorrow. Met with pt who was observed to have significant generalized muscle wasting. Together with ongoing inadequate oral intake, pt meets criteria for severe acute malnutrition. No new weight since admission for trend. Pt reports she dislikes the chocolate and vanilla supplements and would like to try Strawberry flavor. Encouraged intake though pt reports little appetite at this time. No n/v or other GI complaints. Skin: no edema or wounds documentedDiet/supplement intake: 25-50% intake of meals with ~1 ensure Enlive per dayBowels/GI: medium soft BM via ostomyWeight: no weight since last admissionPain: none reportedMedications include: Current Facility-Administered Medications Medication  chlorhexidine gluconate  [Held by provider] enoxaparin (LOVENOX) treatment  ertapenem  famotidine  ferrous gluconate  folic acid  gabapentin  povidone-iodine  rosuvastatin  sodium chloride  sodium chloride Nutrition Related Labs include:   Chemistry  Lab Results Component Value Date  NA 143 09/23/2023  K 4.0 09/23/2023  CL 121 (H) 09/23/2023  CO2 22 09/23/2023  BUN 13 09/23/2023  CREATININE 0.71 09/23/2023  GLU 86 09/23/2023  Lab Results Component Value Date  CALCIUM 9.9 09/23/2023  ALKPHOS 64 09/23/2023  AST 19 09/23/2023  ALT 10 (L) 09/23/2023  BILITOT 0.2 09/23/2023   Results in Past 7 DaysResult Component Current Result Hematocrit 26.60 (L) (09/23/2023) Hemoglobin 8.0 (L) (09/23/2023) MCH 27.5 (09/23/2023) MCHC 30.1 (L) (09/23/2023) MCV 91.4 (09/23/2023) MPV 9.3 (09/23/2023) Platelets 417 (09/23/2023) RBC 2.91 (L) (09/23/2023) WBC 4.9 (09/23/2023) Lab Results Component Value Date  HGBA1C 5.5 03/09/2023  Nutritional AssessmentTimepoint: ReassessmentReason For Assessment: per organizational policy Patient Reported Diet/Restrictions/Preferences: regularCurrent StateCurrent Appetite: poorSpecialty Diet/Nutrition Received: choppedDiet/Feeding Assistance: tray set-upDiet/Feeding Tolerance: goodNutrition Interventions: diet adjustment recommendedNutrition Risk Screen: no indicators presentNutrition Focused Physical FindingsOverall Physical Appearance: loss of muscle massNutrition OrderNutrition Order: meets nutritional requirementsNutrition RiskDate of Last RD visit: 10/02/24Date of next RD visit: 09/28/23 (f/u)Nutrition Risk: patient at nutritional riskLevel of Risk: highNutrition DiagnosisNutrition Diagnosis/Problem: Severe MalnutritionRelated to: decreased appetiteAs Evidenced by: 25-50% intake documented with observed muscle wasting.Malnutrition Diagnosis - Adult: Severe MalnutritionRecommendations:Continue dysphagia/chopped diet as ordered. Adjust Ensure to Original - Strawberry.Consider appetite stimulant daily. Assist with meal set up and encourage intake. Weekly weight. Interventions:Case reviewed.Supplement adjusted. Malnutrition documented.RD to follow clinical course and adjust nutrition plan as needed. Monitoring / Evaluation:Skin integrity - Goal: intactWeight - Goal: stable during admissionEnergy intake - Goal: >/= 75% estimated needs via oral intakeRD will continue to follow clinical course per protocol and adjust nutrition plan as appropriateElectronically Signed by Albertina Parr, MSRD CNSC

## 2023-09-23 NOTE — Plan of Care
 Plan of Care Overview/ Patient Status    Pt is AAOx3, disoriented to time. No complaints of pain. No SOB or acute respiratory distress noted BP low. Incontinent of B/B - ileostomy in place drined 300 ccs overnight. Medications administered per MAR. Safety precautions maintained, call bell within reach hourly rounding performed.

## 2023-09-23 NOTE — Progress Notes
 Patient with left pyelo  HPICrystal A [name] is a 67 y.o. female  status post bilateral stent placement for urosepsisPatient had passed a right ureteral stone has not asymptomatic left ureteral stonePatient more alert todayUrine blood-tinged07/07/2023 patient looks reasonably well with a clear understanding what is going onPatient with Klebsiella and urine of presses urine blood-tinged improving\WBC down to 7.9 from 22 creatinine 0.757/11/2023 sp bilateral stent passed stone r uretr has left midOn ceftin sepsis PAR for urs on left remove stent rightUa wbc  09/15/2023 patient is status post bilateral ureteral obstruction urgent bilateral stent placedPatient did have right ureteral stone Past had a difficult ureteroscopy on the left side secondary to 4 mm stone in the mid proximal ureterCurrently has stent placed since middle of JulyAdmitted for fever sepsisHopefully during this hospitalization will require ureteroscopy probably next week and recommendations to follow10/01/2023 schedle cysto urs in am PAR all discussedHPI Past Medical HistoryPast Medical History    Past Medical History: Diagnosis Date  Acute embolism and thrombosis of unspecified femoral vein (HC Code)    Anemia    Cellulitis and abscess of right lower extremity    Dysphagia    Encephalopathy    GERD (gastroesophageal reflux disease)    Hypercalcemia    Paraplegia (HC Code)    Rheumatoid arthritis (HC Code)     Past Surgical HistoryPast Surgical History     Past Surgical History: Procedure Laterality Date  BAND HEMORRHOIDECTOMY      CYSTECTOMY   1972  LAPAROSCOPIC SALPINGOOPHERECTOMY   1986  LAPAROTOMY   2022  PARTIAL COLECTOMY   2022  PEG TUBE PLACEMENT   01/2022  UPPER GASTROINTESTINAL ENDOSCOPY       AllergiesNo Known [email or web address] @ Social HistorySocial History     Tobacco Use  Smoking status: Never  Smokeless tobacco: Never  Family History No family history on file. Review of SystemsNo symptoms beyond those noted in HPI and PMH- all other systems are negative Physical ExamBP 103/66  - Pulse 78  - Temp 99.8 ?F (37.7 ?C) (Temporal)  - Resp 20  - Ht 5' 10 (1.778 m)  - Wt 65.6 kg  - SpO2 95%  - BMI 20.75 kg/m?      Lab Results Component Value Date   WBC 7.9 06/29/2023   HGB 9.1 (L) 06/29/2023   HCT 28.80 (L) 06/29/2023   PLT 176 06/29/2023   ALT 8 (L) 06/29/2023   AST 9 06/29/2023   NA 144 06/29/2023   K 3.2 (L) 06/29/2023   CL 116 (H) 06/29/2023   CREATININE 0.75 06/29/2023   BUN 9 06/29/2023   CO2 23 06/29/2023   TSH 0.527 03/09/2023   INR 1.08 06/28/2023   GLU 88 06/29/2023   HGBA1C 5.5 03/09/2023   No results found for: UCOLOR, UGLUCOSE, UKETONE, USPECGRAVITY, UPH, UPROTEIN, UNITRATES, UBLOOD, ULEUKOCYTES, UA  No results found for: PVRPOCT     Previous resultsNo results found for: TESTOSTERONE  AUA Symptom Score today:       No data to display        PATHOLOGY AND BIOPSIES   TEST RESULTS   Images     DISCUSSION       FL OR Urography Retrograde Result Date: 06/27/2023 Intraoperative fluoroscopy was utilized for bilateral retrograde injections and bilateral ureteral stent placements. Reported and signed by:  Danielle Clines, MD  Danielle [name] Abdomen Pelvis wo IV Contrast (No Oral)  (Renal Stone) Result Date: 06/26/2023 1. Moderate obstruction of the right kidney from a 6, right UVJ calculus. 2.  Nonobstructing renal calculi. 3. Gallstones with trace pericholecystic fluid could be secondary to the adjacent inflammatory change. Cholecystitis cannot be entirely excluded. 4. Postoperative changes with extensive colonic diverticulosis. Reported and signed by:  Danielle Clines, MD  XR Chest [name] or AP Result Date: 7/5/2024Limited AP view- No acute abnormality. [name] Radiology Notify System Classification: Routine. Reported and signed by:  Danielle [name]  US Duplex Lower Extremity Venous Bilat Result Date: 06/19/2023 No DVT of the right or left lower extremity. [facility] Radiology Notify System Classification: Routine. Reported and signed by:  Danielle Chimera, MD  Smith Abdomen Pelvis w IV Contrast (No Oral) Result Date: 05/24/2023  1. Extensive colonic diverticulosis without [place] evidence for diverticulitis. No definite contrast extravasation is seen to identify a source of bleeding. 2. Nonobstructing right renal calculi. Delayed left nephrogram with a new 5 mm calculus projecting at the level of the left iliac crest highly suspicious for a left ureteral calculus Danielle [name] discussed these findings with Danielle [name] at 2:56 AM. I discussed these findings with Danielle [name] at 9:30 AM. Radiology Notify System Classification: Clinical team aware. Individual dose optimization technique was used for the performance of this procedure, and one or more of the following dose reduction techniques was used: Automated exposure control and/or adjustment of the MA and/or kV according to patient size and/or the use of iterative reconstruction technique. Reported and signed by:  Danielle Baseman, MD  CXR portable Result Date: 03/09/2023  No active cardiopulmonary abnormalities.  No change. [facility]: Routine. Reported and signed by: Danielle Critchley, MD    Assessment & Plan: Danielle [name] is a 67 y.o.URS in am Npo

## 2023-09-23 NOTE — Plan of Care
 Plan of Care Overview/ Patient Status    Patient is alert and confused on room air. She denies pain. We tried to get her out of bed today but she couldn't do it with the steady and 2 staff assist. Patient is incontinent of urine. She is on IV hydration. Ileostomy bag and wafer changed at 6pm. Pure wick in place. Patient has renal ultrasound npo after midnight for procedure tomorrow.Vital Sign History Chronological  09/23/2023   6:31 AM 09/23/2023   8:32 AM 09/23/2023   1:05 PM Vital Signs Temp 98.4 ?F (36.9 ?C) 97.9 ?F (36.6 ?C) 97.2 ?F (36.2 ?C) Temp src Oral Oral Oral Pulse 55 53 58 BP 94/55 90/54 107/71 Resp 18 20 20  SpO2 97 % 96 % 100 % Problem: Diabetes ComorbidityGoal: Blood Glucose Level Within Targeted RangeOutcome: Interventions implemented as appropriate Problem: Violence Risk or ActualGoal: Anger and Impulse ControlOutcome: Interventions implemented as appropriate Problem: Adult Inpatient Plan of CareGoal: Plan of Care ReviewOutcome: Interventions implemented as appropriateGoal: Patient-Specific Goal (Individualized)Outcome: Interventions implemented as appropriateGoal: Absence of Hospital-Acquired Illness or InjuryOutcome: Interventions implemented as appropriateGoal: Optimal Comfort and WellbeingOutcome: Interventions implemented as appropriateGoal: Readiness for Transition of CareOutcome: Interventions implemented as appropriate Problem: Fall Injury RiskGoal: Absence of Fall and Fall-Related InjuryOutcome: Interventions implemented as appropriate Problem: Wound Healing ProgressionGoal: Optimal Wound HealingOutcome: Interventions implemented as appropriate Problem: Skin Injury Risk IncreasedGoal: Skin Health and IntegrityOutcome: Interventions implemented as appropriate Problem: InfectionGoal: Absence of Infection Signs and SymptomsOutcome: Interventions implemented as appropriate Problem: Physical Therapy GoalsGoal: Physical Therapy GoalsDescription: 1) bed mob w/ minx12) txfrs w/ RW mod a x2Outcome: Interventions implemented as appropriate

## 2023-09-24 ENCOUNTER — Inpatient Hospital Stay: Admit: 2023-09-24 | Payer: MEDICARE

## 2023-09-24 ENCOUNTER — Inpatient Hospital Stay: Admit: 2023-09-24 | Payer: MEDICARE | Attending: Anesthesiology

## 2023-09-24 DIAGNOSIS — A419 Sepsis, unspecified organism: Secondary | ICD-10-CM

## 2023-09-24 LAB — COMPREHENSIVE METABOLIC PANEL
BKR A/G RATIO: 0.5
BKR ALANINE AMINOTRANSFERASE (ALT): 10 U/L — ABNORMAL LOW (ref 12–78)
BKR ALBUMIN: 2.4 g/dL — ABNORMAL LOW (ref 3.4–5.0)
BKR ALKALINE PHOSPHATASE: 64 U/L (ref 20–120)
BKR ANION GAP: 1 — ABNORMAL LOW (ref 5–18)
BKR ASPARTATE AMINOTRANSFERASE (AST): 10 U/L (ref 5–37)
BKR AST/ALT RATIO: 1
BKR BILIRUBIN TOTAL: 0.1 mg/dL (ref 0.0–1.0)
BKR BLOOD UREA NITROGEN: 12 mg/dL (ref 8–25)
BKR BUN / CREAT RATIO: 19 (ref 8.0–25.0)
BKR CALCIUM: 9.7 mg/dL (ref 8.4–10.3)
BKR CHLORIDE: 119 mmol/L — ABNORMAL HIGH (ref 95–115)
BKR CO2: 24 mmol/L (ref 21–32)
BKR CREATININE: 0.63 mg/dL (ref 0.50–1.30)
BKR EGFR, CREATININE (CKD-EPI 2021): >60 mL/min/{1.73_m2} (ref >=60)
BKR GLOBULIN: 4.6 g/dL
BKR GLUCOSE: 88 mg/dL (ref 70–100)
BKR OSMOLALITY CALCULATION: 286 mosm/kg (ref 275–295)
BKR POTASSIUM: 3.7 mmol/L (ref 3.5–5.1)
BKR PROTEIN TOTAL: 7 g/dL (ref 6.4–8.2)
BKR SODIUM: 144 mmol/L (ref 136–145)

## 2023-09-24 LAB — MANUAL DIFFERENTIAL
BKR WAM BASOPHIL - ABS (DIFF) 2 DEC: 0 x 1000/ÂµL (ref 0.00–1.00)
BKR WAM BASOPHILS (DIFF): 0 % (ref 0.0–1.4)
BKR WAM EOSINOPHILS (DIFF) 2 DEC: 0.31 x 1000/ÂµL (ref 0.00–1.00)
BKR WAM EOSINOPHILS (DIFF): 7.2 % — ABNORMAL HIGH (ref 0.0–5.0)
BKR WAM LYMPHOCYTE - ABS (DIFF) 2 DEC: 1.16 x 1000/ÂµL (ref 0.60–3.70)
BKR WAM LYMPHOCYTES (DIFF): 26.8 % (ref 17.0–50.0)
BKR WAM MONOCYTE - ABS (DIFF) 2 DEC: 0.31 x 1000/ÂµL (ref 0.00–1.00)
BKR WAM MONOCYTES (DIFF): 7.1 % (ref 4.0–12.0)
BKR WAM NEUTROPHILS (DIFF): 58.9 % (ref 39.0–72.0)
BKR WAM NEUTROPHILS - ABS (DIFF) 2 DEC: 2.54 x 1000/ÂµL (ref 2.00–7.60)
BKR WAM NORMAL RBC MORPHOLOGY: NORMAL

## 2023-09-24 LAB — BASIC METABOLIC PANEL
BKR ANION GAP: 4 — ABNORMAL LOW (ref 5–18)
BKR BLOOD UREA NITROGEN: 12 mg/dL (ref 8–25)
BKR BUN / CREAT RATIO: 15.6 (ref 8.0–25.0)
BKR CALCIUM: 9.2 mg/dL (ref 8.4–10.3)
BKR CHLORIDE: 121 mmol/L — ABNORMAL HIGH (ref 95–115)
BKR CO2: 20 mmol/L — ABNORMAL LOW (ref 21–32)
BKR CREATININE: 0.77 mg/dL (ref 0.50–1.30)
BKR EGFR, CREATININE (CKD-EPI 2021): >60 mL/min/{1.73_m2} (ref >=60)
BKR GLUCOSE: 111 mg/dL — ABNORMAL HIGH (ref 70–100)
BKR OSMOLALITY CALCULATION: 289 mosm/kg (ref 275–295)
BKR POTASSIUM: 3.7 mmol/L (ref 3.5–5.1)
BKR SODIUM: 145 mmol/L (ref 136–145)

## 2023-09-24 LAB — CBC WITH AUTO DIFFERENTIAL
BKR WAM ABSOLUTE IMMATURE GRANULOCYTES.: 0.02 x 1000/ÂµL (ref 0.00–0.30)
BKR WAM ABSOLUTE LYMPHOCYTE COUNT.: 0.14 x 1000/ÂµL — ABNORMAL LOW (ref 0.60–3.70)
BKR WAM ABSOLUTE NRBC (2 DEC): 0 x 1000/ÂµL (ref 0.00–1.00)
BKR WAM ABSOLUTE NRBC (2 DEC): 0 x 1000/ÂµL (ref 0.00–1.00)
BKR WAM ANC (ABSOLUTE NEUTROPHIL COUNT): 6.38 x 1000/ÂµL (ref 2.00–7.60)
BKR WAM BASOPHIL ABSOLUTE COUNT.: 0.01 x 1000/ÂµL (ref 0.00–1.00)
BKR WAM BASOPHILS: 0.2 % (ref 0.0–1.4)
BKR WAM EOSINOPHIL ABSOLUTE COUNT.: 0.01 x 1000/ÂµL (ref 0.00–1.00)
BKR WAM EOSINOPHILS: 0.2 % (ref 0.0–5.0)
BKR WAM HEMATOCRIT (2 DEC): 22.6 % — ABNORMAL LOW (ref 35.00–45.00)
BKR WAM HEMATOCRIT (2 DEC): 32.4 % — ABNORMAL LOW (ref 35.00–45.00)
BKR WAM HEMOGLOBIN: 6.8 g/dL — ABNORMAL LOW (ref 11.7–15.5)
BKR WAM HEMOGLOBIN: 9.9 g/dL — ABNORMAL LOW (ref 11.7–15.5)
BKR WAM IMMATURE GRANULOCYTES: 0.3 % (ref 0.0–1.0)
BKR WAM LYMPHOCYTES: 2.1 % — ABNORMAL LOW (ref 17.0–50.0)
BKR WAM MCH (PG): 27 pg (ref 27.0–33.0)
BKR WAM MCH (PG): 27.8 pg (ref 27.0–33.0)
BKR WAM MCHC: 30.1 g/dL — ABNORMAL LOW (ref 31.0–36.0)
BKR WAM MCHC: 30.6 g/dL — ABNORMAL LOW (ref 31.0–36.0)
BKR WAM MCV: 89.7 fL (ref 80.0–100.0)
BKR WAM MCV: 91 fL (ref 80.0–100.0)
BKR WAM MONOCYTE ABSOLUTE COUNT.: 0.02 x 1000/ÂµL (ref 0.00–1.00)
BKR WAM MONOCYTES: 0.3 % — ABNORMAL LOW (ref 4.0–12.0)
BKR WAM MPV: 9.4 fL (ref 8.0–12.0)
BKR WAM MPV: 9.1 fL (ref 8.0–12.0)
BKR WAM NEUTROPHILS: 96.9 % — ABNORMAL HIGH (ref 39.0–72.0)
BKR WAM NUCLEATED RED BLOOD CELLS: 0 % (ref 0.0–1.0)
BKR WAM NUCLEATED RED BLOOD CELLS: 0 % (ref 0.0–1.0)
BKR WAM PLATELETS: 429 x1000/ÂµL — ABNORMAL HIGH (ref 150–420)
BKR WAM PLATELETS: 192 x1000/ÂµL (ref 150–420)
BKR WAM RDW-CV: 16.5 % — ABNORMAL HIGH (ref 11.0–15.0)
BKR WAM RDW-CV: 16.3 % — ABNORMAL HIGH (ref 11.0–15.0)
BKR WAM RED BLOOD CELL COUNT.: 2.52 M/ÂµL — ABNORMAL LOW (ref 4.00–6.00)
BKR WAM RED BLOOD CELL COUNT.: 3.56 M/ÂµL — ABNORMAL LOW (ref 4.00–6.00)
BKR WAM WHITE BLOOD CELL COUNT: 4.3 x1000/ÂµL (ref 4.0–11.0)
BKR WAM WHITE BLOOD CELL COUNT: 6.6 x1000/ÂµL (ref 4.0–11.0)

## 2023-09-24 LAB — PT/INR AND PTT (BH GH L LMW YH)
BKR INR: 1.11 (ref 0.91–1.18)
BKR PARTIAL THROMBOPLASTIN TIME: 25.6 s (ref 23.0–31.0)
BKR PROTHROMBIN TIME: 11.4 s (ref 9.4–12.0)

## 2023-09-24 MED ORDER — PHENYLEPHRINE 1 MG/10 ML (100 MCG/ML) IN 0.9 % SOD.CHLORIDE IV SYRINGE
1 | INTRAVENOUS | Status: DC | PRN
Start: 2023-09-24 — End: 2023-09-24
  Administered 2023-09-24: 16:00:00 1 mg/0 mL (00 mcg/mL) via INTRAVENOUS

## 2023-09-24 MED ORDER — IOHEXOL 240 MG IODINE/ML INTRAVENOUS SOLUTION
240 | Status: CP
Start: 2023-09-24 — End: ?

## 2023-09-24 MED ORDER — ACETAMINOPHEN 1,000 MG/100 ML (10 MG/ML) INTRAVENOUS SOLUTION
10 | Freq: Once | INTRAVENOUS | Status: CP
Start: 2023-09-24 — End: ?
  Administered 2023-09-24: 19:00:00 10 mL/h via INTRAVENOUS

## 2023-09-24 MED ORDER — ACETAMINOPHEN 500 MG TABLET
500 mg | Freq: Four times a day (QID) | ORAL | Status: DC
Start: 2023-09-24 — End: 2023-09-24

## 2023-09-24 MED ORDER — PROPOFOL 10 MG/ML INTRAVENOUS EMULSION
10 | INTRAVENOUS | Status: DC | PRN
Start: 2023-09-24 — End: 2023-09-24
  Administered 2023-09-24: 16:00:00 10 mL/h via INTRAVENOUS

## 2023-09-24 MED ORDER — FENTANYL (PF) 50 MCG/ML INJECTION SOLUTION
50 | INTRAVENOUS | Status: DC | PRN
Start: 2023-09-24 — End: 2023-09-24
  Administered 2023-09-24: 17:00:00 50 mcg/mL via INTRAVENOUS

## 2023-09-24 MED ORDER — PHENYLEPHRINE 1 MG/10 ML (100 MCG/ML) IN 0.9 % SOD.CHLORIDE IV SYRINGE
1 | Status: CP
Start: 2023-09-24 — End: ?

## 2023-09-24 MED ORDER — OXYCODONE IMMEDIATE RELEASE 5 MG TABLET
5 mg | ORAL | Status: DC | PRN
Start: 2023-09-24 — End: 2023-09-24

## 2023-09-24 MED ORDER — CEFAZOLIN 1 GRAM SOLUTION FOR INJECTION
1 | Status: CP
Start: 2023-09-24 — End: ?

## 2023-09-24 MED ORDER — IOHEXOL 240 MG IODINE/ML INTRAVENOUS SOLUTION
240 | Status: DC | PRN
Start: 2023-09-24 — End: 2023-09-24
  Administered 2023-09-24: 17:00:00 240 mg iodine/mL

## 2023-09-24 MED ORDER — HYDROMORPHONE 0.5 MG/0.5 ML INJECTION SYRINGE
0.50.5 mg/ mL | INTRAVENOUS | Status: DC | PRN
Start: 2023-09-24 — End: 2023-09-24

## 2023-09-24 MED ORDER — OXYCODONE IMMEDIATE RELEASE 10 MG TABLET
10 mg | ORAL | Status: DC | PRN
Start: 2023-09-24 — End: 2023-09-24

## 2023-09-24 MED ORDER — FENTANYL (PF) 50 MCG/ML INJECTION SOLUTION
50 | Status: CP
Start: 2023-09-24 — End: ?

## 2023-09-24 MED ORDER — DEXAMETHASONE SODIUM PHOSPHATE (PF) 10 MG/ML INJECTION SOLUTION
10 mg/mL | Freq: Once | INTRAVENOUS | Status: DC | PRN
Start: 2023-09-24 — End: 2023-09-24

## 2023-09-24 MED ORDER — LACTATED RINGERS INTRAVENOUS SOLUTION
INTRAVENOUS | Status: DC
Start: 2023-09-24 — End: 2023-09-25
  Administered 2023-09-24 – 2023-09-25 (×2): 1000.000 mL/h via INTRAVENOUS

## 2023-09-24 MED ORDER — PROPOFOL 10 MG/ML INTRAVENOUS EMULSION
10 | INTRAVENOUS | Status: DC | PRN
Start: 2023-09-24 — End: 2023-09-24
  Administered 2023-09-24: 16:00:00 10 mg/mL via INTRAVENOUS

## 2023-09-24 MED ORDER — SODIUM CHLORIDE 0.9 % INTRAVENOUS SOLUTION
INTRAVENOUS | Status: DC | PRN
Start: 2023-09-24 — End: 2023-09-24
  Administered 2023-09-24: 16:00:00 via INTRAVENOUS

## 2023-09-24 MED ORDER — PROPOFOL 10 MG/ML INTRAVENOUS EMULSION
10 mg/mL | Status: CP
Start: 2023-09-24 — End: ?

## 2023-09-24 MED ORDER — ONDANSETRON HCL (PF) 4 MG/2 ML INJECTION SOLUTION
42 mg/2 mL | INTRAVENOUS | Status: DC | PRN
Start: 2023-09-24 — End: 2023-09-24

## 2023-09-24 MED ORDER — OXYCODONE (ROXICODONE) IMMEDIATE RELEASE 2.5 MG HALFTAB
2.5 mg | ORAL | Status: DC | PRN
Start: 2023-09-24 — End: 2023-09-24

## 2023-09-24 MED ORDER — HYDROMORPHONE 0.5 MG/0.5 ML INJECTION SYRINGE
0.50.5 mg/ mL | INTRAVENOUS | Status: DC | PRN
Start: 2023-09-24 — End: 2023-09-24
  Administered 2023-09-24: 19:00:00 0.5 mL via INTRAVENOUS

## 2023-09-24 MED ORDER — LIDOCAINE 2 % MUCOSAL JELLY IN APPLICATOR
2 | Status: CP
Start: 2023-09-24 — End: ?

## 2023-09-24 MED ORDER — NALOXONE 0.4 MG/ML INJECTION SOLUTION
0.4 mg/mL | INTRAVENOUS | Status: DC | PRN
Start: 2023-09-24 — End: 2023-09-24

## 2023-09-24 NOTE — Progress Notes
 [facility] Medicine Progress NoteAttending Provider: Lyndee Leo, DO Subjective                                                                              Subjective: Interim History: no acute overnight events. Patient educated on importance of blood transfusion. She states she is absolutely firm about her decision. She had a sister who had a bad reaction to it lifelong and she does not want it. Review of Allergies/Meds/Hx: Review of Allergies/Meds/Hx:I have reviewed the patient's: allergies, current scheduled medications, current infusions, current prn medications, past medical history, past surgical history, family history, social history, and prior to admission medications Objective Objective: Vitals:Last 24 hours: Temp:  [97.2 ?F (36.2 ?C)-97.8 ?F (36.6 ?C)] 97.6 ?F (36.4 ?C)Pulse:  [52-72] 52Resp:  [18-20] 18BP: (86-111)/(51-71) 102/55SpO2:  [97 %-100 %] 97 %I/O's:Gross Totals (Last 24 hours) at 09/24/2023 0952Last data filed at 09/23/2023 2300Intake -- Output 150 ml Net -150 ml Physical ExamConstitutional:     Appearance: Normal appearance. HENT:    Head: Normocephalic and atraumatic. Cardiovascular:    Rate and Rhythm: Normal rate and regular rhythm.    Pulses: Normal pulses.    Heart sounds: Normal heart sounds. Pulmonary:    Effort: Pulmonary effort is normal.    Breath sounds: Normal breath sounds. Abdominal:    General: Abdomen is flat. Bowel sounds are normal.    Palpations: Abdomen is soft.    Comments: colostomy Musculoskeletal:       General: No swelling.    Comments: Right arm midline Skin:   General: Skin is warm and dry.    Coloration: Skin is not jaundiced. Neurological:    Mental Status: She is alert and oriented to person, place, and time. Mental status is at baseline.    Comments: 2/5 motor strength lower extremities  Labs:Last 24 hours: Recent Results (from the past 24 hour(s)) Type and screen  Collection Time: 09/23/23  2:30 PM Result Value Ref Range  ABO Grouping B   Rh Type POS   Antibody Screen NEG   Specimen Expiration Date And Time 09/26/2023 23:59  PT/INR and PTT  Collection Time: 09/24/23  8:04 AM Result Value Ref Range  Prothrombin Time 11.4 9.4 - 12.0 seconds  INR 1.11 0.91 - 1.18  PTT 25.6 23.0 - 31.0 seconds CBC auto differential  Collection Time: 09/24/23  8:04 AM Result Value Ref Range  WBC 4.3 4.0 - 11.0 x1000/?L  RBC 2.52 (L) 4.00 - 6.00 M/?L  Hemoglobin 6.8 (L) 11.7 - 15.5 g/dL  Hematocrit 56.21 (L) 30.86 - 45.00 %  MCV 89.7 80.0 - 100.0 fL  MCH 27.0 27.0 - 33.0 pg  MCHC 30.1 (L) 31.0 - 36.0 g/dL  RDW-CV 57.8 (H) 46.9 - 15.0 %  Platelets 429 (H) 150 - 420 x1000/?L  MPV 9.4 8.0 - 12.0 fL  nRBC 0.0 0.0 - 1.0 %  Absolute nRBC 0.00 0.00 - 1.00 x 1000/?L Comprehensive metabolic panel  Collection Time: 09/24/23  8:04 AM Result Value Ref Range  Sodium 144 136 - 145 mmol/L  Potassium 3.7 3.5 - 5.1 mmol/L  Chloride 119 (H) 95 - 115 mmol/L  CO2 24 21 - 32 mmol/L  Anion Gap 1 (L) 5 -  18  Glucose 88 70 - 100 mg/dL  BUN 12 8 - 25 mg/dL  Creatinine 0.98 1.19 - 1.30 mg/dL  Calcium 9.7 8.4 - 14.7 mg/dL  BUN/Creatinine Ratio 82.9 8.0 - 25.0  Total Protein 7.0 6.4 - 8.2 g/dL  Albumin 2.4 (L) 3.4 - 5.0 g/dL  Total Bilirubin 0.1 0.0 - 1.0 mg/dL  Alkaline Phosphatase 64 20 - 120 U/L  Alanine Aminotransferase (ALT) 10 (L) 12 - 78 U/L  Aspartate Aminotransferase (AST) 10 5 - 37 U/L  Globulin 4.6 g/dL  A/G Ratio 0.5   AST/ALT Ratio 1.0 Reference Range Not Established  Osmolality Calculation 286 275 - 295 mOsm/kg  eGFR (Creatinine) >60 >=60 mL/min/1.20m2 Manual Differential  Collection Time: 09/24/23  8:04 AM Result Value Ref Range  Neutrophils 58.9 39.0 - 72.0 %  Lymphocytes 26.8 17.0 - 50.0 %  Monocytes 7.1 4.0 - 12.0 %  Eosinophils 7.2 (H) 0.0 - 5.0 % Basophil 0.0 0.0 - 1.4 %  Neutrophils Absolute 2.54 2.00 - 7.60 x 1000/?L  Lymphocyte Absolute 1.16 0.60 - 3.70 x 1000/?L  Monocyte Absolute Count 0.31 0.00 - 1.00 x 1000/?L  Eosinophil Absolute Count 0.31 0.00 - 1.00 x 1000/?L  Basophil Absolute Count 0.00 0.00 - 1.00 x 1000/?L  RBC Morphology Normal   Large Platelets Present (A) None  Giant PLTs Present None Diagnostics: Assessment Assessment: Assessment:Principal Problem:  Sepsis, due to unspecified organism, unspecified whether acute organ dysfunction present (HC Code) (HC CODE) (HC Code)  SNOMED [place](R): SEPSIS  Active Problems:  UTI (urinary tract infection)  SNOMED [place](R): URINARY TRACT INFECTIOUS DISEASE    Plan Plan: Patient is a 67 year old woman with PMHx of cognitive impairment, hx DVT, RA, 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 Patient seen by urology, pending left stent exchange on Thursday 10/3. Midline placed 9/30, eliquis transitioned to lovenox for procedure.  # Sepsis secondary to acute pyeloureteritis with E coli (ESBL) bacteremia - 07/15/2023: right stent was removed  and left stent was advanced to the renal pelvis, small perforation seen, mild extravasation.- Has been having intermittent drops in blood pressures that have been fluid responsive. - ICU evaluation completed by Dr. Raina Mina on 9/24. - Urology note by Dr. Hulen Luster 9/24: left pyelonephritis excellent stent placement stone is not visualizedMaybe passHopefully this hospitalization early next week we can consider ureteroscopy see if the stone is still there and removal of stent- Blood cx with E coli ESBL . Urine cx the same- repeat blood culture 9/29- Continue ertapenem, day 10- pending Thursday 10/3 ureteroscopy stent exchange destruction of stones- RCRI is zero which confers to 3.9% 30 day risk of death, MI or cardiac arrest: no history of MI, recent TTE 02/2023 with normal EF, stable kidney function, no history of stroke, not on insulin- EKG and CXR unremarkable, poor functional status- patient is an intermediate risk patient for a low risk procedure, no further work up prior to OR # Acute on chronic anemia - no signs of bleeding and stool occult negative- partly dilutional due to IV fluid and inflammation from sepsis- discussed at length with patient today, she adamantly declining blood transfusion, updated Dr. Hulen Luster today - ferrous gluconate  324mg  every other day- stool occult blood negative- Hg may trend downward due to IV fluid  # Hypercalcemia- improved with hydration, will monitor- likely secondary to dehydration- will monitor for now, if persistently elevated will need work up with PTH, phos, vitamin D # Hx DVT - eliquis switched to lovenox therapeutic, lovenox held for procedure # Presence of ostomy - Routine ostomy care # Hypokalemia- replete # HLD- continue crestor 10mg  once daily Other issues stable Hx RAHx peripheral neuropathy - gabapentin dose reduced.  ====* NPO for procedure * DVT ppx: SCD* Full code Patient was seen and examined on 09/24/2023** Addendum: I called patient's sister [name] and she consented to the blood transfusion. I spoke with patient with PA Colin Mulders, patient is angry I spoke with [facility], she finally also agreed to the blood but she is not happy about it. Updated Dr. Hulen Luster.  Comorbidities Comorbidities present on admission:Coagulopathic due to prescription of anticoagulant medication prior to admission.   Secondary diagnoses occurring during hospitalization:Hyponatremia Electronically Signed:Delailah Spieth, DO Beeper 225310/02/2023, 9:51 AM

## 2023-09-24 NOTE — Other
 Operative Diagnosis:Pre-op:   Renal calculus [N20.0] Patient Coded Diagnosis   Pre-op diagnosis: Renal calculus  Post-op diagnosis: Renal calculus  Patient Diagnosis   Pre-op diagnosis: Renal calculus [N20.0]  Post-op diagnosis:     Post-op diagnosis:   * Renal calculus [N20.0]Operative Procedure(s) :Procedure(s) (LRB):Cystoscopy left ureteroscopy, laser lithotripsy, stone extraction, stent placement., removal left ureteral stent (Left)Post-op Procedure & Diagnosis ConfirmationPost-op Diagnosis: Post-op Diagnosis confirmed (no changes)Post-op Procedure: Post-op Procedure confirmed (no changes)Anesthesia ClarifiersCysto/Renal:  Transurethral procedures including urethrocystoscopy including fragmentation, manipulation and/or removal ureteral calculus NOS

## 2023-09-24 NOTE — Anesthesia Post-Procedure Evaluation
 Anesthesia Post-op NotePatient: Danielle A MaloneProcedure(s):  Procedure(s) (LRB):Cystoscopy left ureteroscopy, laser lithotripsy, stone extraction, stent placement., removal left ureteral stent (Left) Last Vitals:  I have reviewed the post-operative vital signs as noted in the Epic chart.POSTOP EVALUATION:      Patient Recovery Location:  PACU     Vital Signs Status:  Stable     Patient Participation:  Patient participated     Mental Status:  Awake     Respiratory Status:  Acceptable     Airway Patency:  Patent     Cardiovascular/Hydration Status:  Stable     Pain Management:  Satisfactory to patient     Nausea/Vomiting Status:  Satisfactory to patientThere were no known notable events for this encounter.

## 2023-09-24 NOTE — Plan of Care
 Plan of Care Overview/ Patient Status          Patient not seen today 2? pt off unit for other procedure. Andrena Mews, MSPTPhysical therapistGreenwich Childrens Medical Center [facility]# [phone].

## 2023-09-24 NOTE — Plan of Care
 Pt alert and oriented. Blood pressure (!) 130/57, pulse (!) 93, temperature 98 ?F (36.7 ?C), resp. rate 20, height 5' 5 (1.651 m), weight 57.9 kg, SpO2 100 %.Pt went for lithotripsy and L side urethral stent .got 1 U PRBC

## 2023-09-24 NOTE — Other
 Post Anesthesia Transfer of Care NotePatient: Danielle A MaloneProcedure(s) Performed: Procedure(s) (LRB):Cystoscopy left ureteroscopy, laser lithotripsy, stone extraction, stent placement., removal left ureteral stent (Left)Last Vitals: I have reviewed the post-operative vital signs during the handoff as noted in the Epic chart.POSTOP HANDOFF :      Patient Location:  PACU     Level of Consciousness:  Awake     VS stable since last recorded intra-op set? Yes       Oxygen source: room airPatient co-morbidities, intra-operative course, intake & output and antibiotics as per Anesthesia record were discussed with the RN.

## 2023-09-24 NOTE — Plan of Care
 Plan of Care Overview/ Patient Status    Pt is AAOx3, disoriented to time. No complaints of pain. No SOB or acute respiratory distress noted. On RA saturating at 98%. BP low, Mds are aware. PIV to the left arm. Midline to the right arm. NS running at 100 mL/ hr. Incontinent of B/B - ileostomy in place. Medications administered per MAR. Pills whole with water. NPO maintained after midnight.   Safety precautions maintained, call bell within reach hourly rounding performed. Vitals:Temperature: 97.4 ?F (36.3 ?C)Pulse: 72Respirations: 20Blood Pressure: (!) 93/54Oxygen Saturation: 98 %

## 2023-09-24 NOTE — Anesthesia Pre-Procedure Evaluation
 This is a 67 y.o. female scheduled for Cystoscopy left ureteroscopy, laser lithotripsy, stone extraction, stent placement. (Left).Review of Systems/ Medical History Patient summary, nursing notes, pre-procedure vitals, height, weight and NPO status reviewed.No previous anesthesia concernsAnesthesia Evaluation:   No history of anesthetic complications  Estimated body mass index.09/24/23 : 21.24 kg/m? Last patient weight recorded. 09/24/23 : 57.9 kg Last patient height recorded. 09/14/23 : 5' 5 (1.651 m) Cardiovascular: Negative      -Exercise tolerance: >4 METS -Vascular Disease:  Negative    Respiratory:  Negative.HEENT: Negative.Neuromuscular: NegativeSkeletal/Skin:  Negative-Joint and Skeletal Disorders: arthritisGastrointestinal/Genitourinary: -Gastrointestinal Disorders:  Patient has diverticulitis. Patient has GERD. Her GERD is well controlled.-Renal Disorders:  Patient has renal calculi.Hematological/Lymphatic: -Anemia: Patient has anemia.  -Comments: DVTEndocrine/Metabolic: -Diabetes mellitus:  Patient has diabetes mellitus.Behavioral/Social/Psychiatric & Syndromes: NegativePhysical ExamCardiovascular:    normal exam  Rhythm: regularHeart Sounds: S1 present and S2 present.Pulmonary:  normal exam  Patient's breath sounds clear to auscultationAirway:  Mallampati: ITM distance: >3 FBNeck ROM: fullDental:  unremarkable  Anesthesia PlanASA 3 The primary anesthesia plan is  general LMA. Perioperative Code Status confirmed: It is my understanding that the patient is currently designated as 'Full Code' and will remain so throughout the perioperative period.Anesthesia informed consent obtained. Consent obtained from: patientUse of blood products: consented  The post operative pain plan is per surgeon management.Anesthesiologist's Pre Op NoteI personally evaluated and examined the patient prior to the intra-operative phase of care on the day of the procedure.Marland Kitchen

## 2023-09-25 ENCOUNTER — Inpatient Hospital Stay: Admit: 2023-09-25 | Payer: MEDICARE

## 2023-09-25 DIAGNOSIS — A419 Sepsis, unspecified organism: Secondary | ICD-10-CM

## 2023-09-25 LAB — CBC WITH AUTO DIFFERENTIAL
BKR WAM ABSOLUTE NRBC (2 DEC): 0 x 1000/ÂµL (ref 0.00–1.00)
BKR WAM ABSOLUTE NRBC (2 DEC): 0 x 1000/ÂµL (ref 0.00–1.00)
BKR WAM ABSOLUTE NRBC (2 DEC): 0 x 1000/ÂµL (ref 0.00–1.00)
BKR WAM HEMATOCRIT (2 DEC): 28.8 % — ABNORMAL LOW (ref 35.00–45.00)
BKR WAM HEMATOCRIT (2 DEC): 28.8 % — ABNORMAL LOW (ref 35.00–45.00)
BKR WAM HEMATOCRIT (2 DEC): 28.1 % — ABNORMAL LOW (ref 35.00–45.00)
BKR WAM HEMOGLOBIN: 9.2 g/dL — ABNORMAL LOW (ref 11.7–15.5)
BKR WAM HEMOGLOBIN: 8.8 g/dL — ABNORMAL LOW (ref 11.7–15.5)
BKR WAM HEMOGLOBIN: 8.8 g/dL — ABNORMAL LOW (ref 11.7–15.5)
BKR WAM MCH (PG): 28.5 pg (ref 27.0–33.0)
BKR WAM MCH (PG): 27.8 pg (ref 27.0–33.0)
BKR WAM MCH (PG): 28.1 pg (ref 27.0–33.0)
BKR WAM MCHC: 31.9 g/dL (ref 31.0–36.0)
BKR WAM MCHC: 30.6 g/dL — ABNORMAL LOW (ref 31.0–36.0)
BKR WAM MCHC: 31.3 g/dL (ref 31.0–36.0)
BKR WAM MCV: 89.2 fL (ref 80.0–100.0)
BKR WAM MCV: 91.1 fL (ref 80.0–100.0)
BKR WAM MCV: 89.8 fL (ref 80.0–100.0)
BKR WAM MPV: 9.5 fL (ref 8.0–12.0)
BKR WAM MPV: 9.5 fL (ref 8.0–12.0)
BKR WAM MPV: 9.4 fL (ref 8.0–12.0)
BKR WAM NUCLEATED RED BLOOD CELLS: 0 % (ref 0.0–1.0)
BKR WAM NUCLEATED RED BLOOD CELLS: 0 % (ref 0.0–1.0)
BKR WAM NUCLEATED RED BLOOD CELLS: 0 % (ref 0.0–1.0)
BKR WAM PLATELETS: 331 x1000/ÂµL (ref 150–420)
BKR WAM PLATELETS: 328 x1000/ÂµL (ref 150–420)
BKR WAM PLATELETS: 311 x1000/ÂµL (ref 150–420)
BKR WAM RDW-CV: 17.1 % — ABNORMAL HIGH (ref 11.0–15.0)
BKR WAM RDW-CV: 17 % — ABNORMAL HIGH (ref 11.0–15.0)
BKR WAM RDW-CV: 17.3 % — ABNORMAL HIGH (ref 11.0–15.0)
BKR WAM RED BLOOD CELL COUNT.: 3.23 M/ÂµL — ABNORMAL LOW (ref 4.00–6.00)
BKR WAM RED BLOOD CELL COUNT.: 3.16 M/ÂµL — ABNORMAL LOW (ref 4.00–6.00)
BKR WAM RED BLOOD CELL COUNT.: 3.13 M/ÂµL — ABNORMAL LOW (ref 4.00–6.00)
BKR WAM WHITE BLOOD CELL COUNT: 44.3 x1000/ÂµL — ABNORMAL HIGH (ref 4.0–11.0)
BKR WAM WHITE BLOOD CELL COUNT: 46.6 x1000/ÂµL — ABNORMAL HIGH (ref 4.0–11.0)
BKR WAM WHITE BLOOD CELL COUNT: 40.4 x1000/ÂµL — ABNORMAL HIGH (ref 4.0–11.0)

## 2023-09-25 LAB — COMPREHENSIVE METABOLIC PANEL
BKR A/G RATIO: 0.5
BKR A/G RATIO: 0.4
BKR ALANINE AMINOTRANSFERASE (ALT): 11 U/L — ABNORMAL LOW (ref 12–78)
BKR ALANINE AMINOTRANSFERASE (ALT): 11 U/L — ABNORMAL LOW (ref 12–78)
BKR ALBUMIN: 2.2 g/dL — ABNORMAL LOW (ref 3.4–5.0)
BKR ALBUMIN: 2.1 g/dL — ABNORMAL LOW (ref 3.4–5.0)
BKR ALKALINE PHOSPHATASE: 61 U/L (ref 20–120)
BKR ALKALINE PHOSPHATASE: 61 U/L (ref 20–120)
BKR ANION GAP: 5 (ref 5–18)
BKR ANION GAP: 8 (ref 5–18)
BKR ASPARTATE AMINOTRANSFERASE (AST): 30 U/L (ref 5–37)
BKR ASPARTATE AMINOTRANSFERASE (AST): 35 U/L (ref 5–37)
BKR AST/ALT RATIO: 2.7
BKR AST/ALT RATIO: 3.2
BKR BILIRUBIN TOTAL: 0.2 mg/dL (ref 0.0–1.0)
BKR BILIRUBIN TOTAL: 0.3 mg/dL (ref 0.0–1.0)
BKR BLOOD UREA NITROGEN: 14 mg/dL (ref 8–25)
BKR BLOOD UREA NITROGEN: 15 mg/dL (ref 8–25)
BKR BUN / CREAT RATIO: 12.6 (ref 8.0–25.0)
BKR BUN / CREAT RATIO: 16.7 (ref 8.0–25.0)
BKR CALCIUM: 9 mg/dL (ref 8.4–10.3)
BKR CALCIUM: 8.8 mg/dL (ref 8.4–10.3)
BKR CHLORIDE: 120 mmol/L — ABNORMAL HIGH (ref 95–115)
BKR CHLORIDE: 119 mmol/L — ABNORMAL HIGH (ref 95–115)
BKR CO2: 20 mmol/L — ABNORMAL LOW (ref 21–32)
BKR CO2: 18 mmol/L — ABNORMAL LOW (ref 21–32)
BKR CREATININE: 1.11 mg/dL (ref 0.50–1.30)
BKR CREATININE: 0.9 mg/dL (ref 0.50–1.30)
BKR EGFR, CREATININE (CKD-EPI 2021): 54 mL/min/{1.73_m2} — ABNORMAL LOW (ref >=60)
BKR EGFR, CREATININE (CKD-EPI 2021): >60 mL/min/{1.73_m2} (ref >=60)
BKR GLOBULIN: 4.6 g/dL
BKR GLOBULIN: 4.7 g/dL
BKR GLUCOSE: 98 mg/dL (ref 70–100)
BKR GLUCOSE: 85 mg/dL (ref 70–100)
BKR OSMOLALITY CALCULATION: 289 mosm/kg (ref 275–295)
BKR OSMOLALITY CALCULATION: 289 mosm/kg (ref 275–295)
BKR POTASSIUM: 3.3 mmol/L — ABNORMAL LOW (ref 3.5–5.1)
BKR POTASSIUM: 3.9 mmol/L (ref 3.5–5.1)
BKR PROTEIN TOTAL: 6.8 g/dL (ref 6.4–8.2)
BKR PROTEIN TOTAL: 6.8 g/dL (ref 6.4–8.2)
BKR SODIUM: 145 mmol/L (ref 136–145)
BKR SODIUM: 145 mmol/L (ref 136–145)

## 2023-09-25 LAB — MANUAL DIFFERENTIAL
BKR WAM ATYPICAL LYMPHOCYTES (DIFF) 1 DEC: 0.8 % (ref 0.0–1.0)
BKR WAM BANDS (DIFF) (1 DEC): 19.3 % — ABNORMAL HIGH (ref 0.0–10.0)
BKR WAM BANDS (DIFF) (1 DEC): 7.5 % (ref 0.0–10.0)
BKR WAM BANDS (DIFF) (1 DEC): 9.9 % (ref 0.0–10.0)
BKR WAM BASOPHIL - ABS (DIFF) 2 DEC: 0.4 x 1000/ÂµL (ref 0.00–1.00)
BKR WAM BASOPHIL - ABS (DIFF) 2 DEC: 0 x 1000/ÂµL (ref 0.00–1.00)
BKR WAM BASOPHIL - ABS (DIFF) 2 DEC: 0 x 1000/ÂµL (ref 0.00–1.00)
BKR WAM BASOPHILS (DIFF): 0.9 % (ref 0.0–1.4)
BKR WAM BASOPHILS (DIFF): 0 % (ref 0.0–1.4)
BKR WAM BASOPHILS (DIFF): 0 % (ref 0.0–1.4)
BKR WAM EOSINOPHILS (DIFF) 2 DEC: 0 x 1000/ÂµL (ref 0.00–1.00)
BKR WAM EOSINOPHILS (DIFF) 2 DEC: 0 x 1000/ÂµL (ref 0.00–1.00)
BKR WAM EOSINOPHILS (DIFF) 2 DEC: 0 x 1000/ÂµL (ref 0.00–1.00)
BKR WAM EOSINOPHILS (DIFF): 0 % (ref 0.0–5.0)
BKR WAM EOSINOPHILS (DIFF): 0 % (ref 0.0–5.0)
BKR WAM EOSINOPHILS (DIFF): 0 % (ref 0.0–5.0)
BKR WAM LYMPHOCYTE - ABS (DIFF) 2 DEC: 0.35 x 1000/ÂµL — ABNORMAL LOW (ref 0.60–3.70)
BKR WAM LYMPHOCYTE - ABS (DIFF) 2 DEC: 2.71 x 1000/ÂµL (ref 0.60–3.70)
BKR WAM LYMPHOCYTE - ABS (DIFF) 2 DEC: 1.66 x 1000/ÂµL (ref 0.60–3.70)
BKR WAM LYMPHOCYTES (DIFF): 0.8 % — ABNORMAL LOW (ref 17.0–50.0)
BKR WAM LYMPHOCYTES (DIFF): 5.8 % — ABNORMAL LOW (ref 17.0–50.0)
BKR WAM LYMPHOCYTES (DIFF): 4.1 % — ABNORMAL LOW (ref 17.0–50.0)
BKR WAM METAMYELOCYTES (DIFF) 1 DEC: 5.1 % — ABNORMAL HIGH (ref 0.0–1.0)
BKR WAM MONOCYTE - ABS (DIFF) 2 DEC: 1.11 x 1000/ÂµL — ABNORMAL HIGH (ref 0.00–1.00)
BKR WAM MONOCYTE - ABS (DIFF) 2 DEC: 1.54 x 1000/ÂµL — ABNORMAL HIGH (ref 0.00–1.00)
BKR WAM MONOCYTE - ABS (DIFF) 2 DEC: 0 x 1000/ÂµL (ref 0.00–1.00)
BKR WAM MONOCYTES (DIFF): 2.5 % — ABNORMAL LOW (ref 4.0–12.0)
BKR WAM MONOCYTES (DIFF): 3.3 % — ABNORMAL LOW (ref 4.0–12.0)
BKR WAM MONOCYTES (DIFF): 0 % — ABNORMAL LOW (ref 4.0–12.0)
BKR WAM NEUTROPHILS (DIFF): 70.6 % (ref 39.0–72.0)
BKR WAM NEUTROPHILS (DIFF): 83.4 % — ABNORMAL HIGH (ref 39.0–72.0)
BKR WAM NEUTROPHILS (DIFF): 86 % — ABNORMAL HIGH (ref 39.0–72.0)
BKR WAM NEUTROPHILS - ABS (DIFF) 2 DEC: 39.8 x 1000/ÂµL — ABNORMAL HIGH (ref 2.00–7.60)
BKR WAM NEUTROPHILS - ABS (DIFF) 2 DEC: 42.4 x 1000/ÂµL — ABNORMAL HIGH (ref 2.00–7.60)
BKR WAM NEUTROPHILS - ABS (DIFF) 2 DEC: 38.72 x 1000/ÂµL — ABNORMAL HIGH (ref 2.00–7.60)

## 2023-09-25 LAB — C-REACTIVE PROTEIN     (CRP): BKR C-REACTIVE PROTEIN: 12.6 mg/dL — ABNORMAL HIGH (ref 0.0–1.0)

## 2023-09-25 LAB — LACTIC ACID, PLASMA (REFLEX 2H REPEAT)
BKR LACTATE: 2.9 mmol/L — ABNORMAL HIGH (ref 0.4–2.0)
BKR LACTATE: 2 mmol/L (ref 0.4–2.0)

## 2023-09-25 LAB — PROCALCITONIN     (BH GH LMW Q YH): BKR PROCALCITONIN: 161.27 ng/mL — ABNORMAL HIGH

## 2023-09-25 LAB — SEDIMENTATION RATE (ESR): BKR SEDIMENTATION RATE, ERYTHROCYTE: 60 mm/h — ABNORMAL HIGH (ref 0–20)

## 2023-09-25 MED ORDER — OXYCODONE IMMEDIATE RELEASE 5 MG TABLET
5 | Freq: Four times a day (QID) | ORAL | Status: DC | PRN
Start: 2023-09-25 — End: 2023-09-26

## 2023-09-25 MED ORDER — POTASSIUM CHLORIDE ER 20 MEQ TABLET,EXTENDED RELEASE(PART/CRYST)
20 | Freq: Once | ORAL | Status: CP
Start: 2023-09-25 — End: ?
  Administered 2023-09-25: 15:00:00 20 MEQ via ORAL

## 2023-09-25 MED ORDER — LACTATED RINGERS IV BOLUS (NEW BAG)
Freq: Once | INTRAVENOUS | Status: CP
Start: 2023-09-25 — End: ?
  Administered 2023-09-26: 1000.000 mL/h via INTRAVENOUS

## 2023-09-25 MED ORDER — SENNOSIDES 8.6 MG TABLET
8.6 mg | Freq: Every evening | ORAL | Status: CP
Start: 2023-09-25 — End: 2023-09-30
  Administered 2023-09-27 – 2023-09-30 (×4): 8.6 mg via ORAL

## 2023-09-25 MED ORDER — ONDANSETRON HCL (PF) 4 MG/2 ML INJECTION SOLUTION
42 mg/2 mL | Freq: Four times a day (QID) | INTRAVENOUS | Status: AC | PRN
Start: 2023-09-25 — End: 2023-09-30

## 2023-09-25 MED ORDER — LACTATED RINGERS INTRAVENOUS SOLUTION
INTRAVENOUS | Status: DC
Start: 2023-09-25 — End: 2023-09-27
  Administered 2023-09-25 – 2023-09-27 (×3): 1000.000 mL/h via INTRAVENOUS

## 2023-09-25 MED ORDER — PROCHLORPERAZINE EDISYLATE 10 MG/2 ML (5 MG/ML) INJECTION SOLUTION
1025 mg/2 mL (5 mg/mL) | Freq: Four times a day (QID) | INTRAVENOUS | Status: CP | PRN
Start: 2023-09-25 — End: 2023-10-01

## 2023-09-25 MED ORDER — APIXABAN 5 MG TABLET
5 mg | Freq: Two times a day (BID) | ORAL | Status: CP
Start: 2023-09-25 — End: 2023-10-01
  Administered 2023-09-26 – 2023-09-30 (×10): 5 mg via ORAL

## 2023-09-25 MED ORDER — ONDANSETRON 4 MG DISINTEGRATING TABLET
4 mg | Freq: Four times a day (QID) | ORAL | Status: AC | PRN
Start: 2023-09-25 — End: 2023-09-30

## 2023-09-25 MED ORDER — OXYCODONE IMMEDIATE RELEASE 5 MG TABLET
5 mg | ORAL | Status: AC | PRN
Start: 2023-09-25 — End: 2023-10-01

## 2023-09-25 MED ORDER — LACTATED RINGERS IV BOLUS (NEW BAG)
Freq: Once | INTRAVENOUS | Status: CP
Start: 2023-09-25 — End: ?
  Administered 2023-09-25: 20:00:00 1000.000 mL/h via INTRAVENOUS

## 2023-09-25 MED ORDER — POLYETHYLENE GLYCOL 3350 17 GRAM ORAL POWDER PACKET
17 gram | Freq: Every day | ORAL | Status: CP
Start: 2023-09-25 — End: 2023-09-30
  Administered 2023-09-26 – 2023-09-27 (×2): 17 gram via ORAL

## 2023-09-25 MED ORDER — OXYCODONE IMMEDIATE RELEASE 10 MG TABLET
10 mg | ORAL | Status: AC | PRN
Start: 2023-09-25 — End: 2023-09-30

## 2023-09-25 MED ORDER — ACETAMINOPHEN 325 MG TABLET
325 mg | Freq: Four times a day (QID) | ORAL | Status: CP
Start: 2023-09-25 — End: 2023-09-30
  Administered 2023-09-26 – 2023-09-29 (×8): 325 mg via ORAL

## 2023-09-25 MED ORDER — IOHEXOL 350 MG IODINE/ML INTRAVENOUS SOLUTION
350 | Freq: Once | INTRAVENOUS | Status: CP | PRN
Start: 2023-09-25 — End: ?
  Administered 2023-09-25: 19:00:00 350 mL via INTRAVENOUS

## 2023-09-25 MED ORDER — SODIUM CHLORIDE 0.9 % IV BOLUS FROM BAG
0.9 | INTRAVENOUS | Status: CP
Start: 2023-09-25 — End: ?
  Administered 2023-09-25: 19:00:00 0.9 mL/h via INTRAVENOUS

## 2023-09-25 MED ORDER — METRONIDAZOLE 500 MG/100 ML IN SODIUM CHLOR(ISO) INTRAVENOUS PIGGYBACK
500100 mg/100 mL | Freq: Three times a day (TID) | INTRAVENOUS | Status: DC
Start: 2023-09-25 — End: 2023-09-26

## 2023-09-25 MED ORDER — PANTOPRAZOLE 40 MG TABLET,DELAYED RELEASE
40 mg | Freq: Two times a day (BID) | ORAL | Status: SS
Start: 2023-09-25 — End: 2023-09-30

## 2023-09-25 MED ORDER — SODIUM CHLORIDE 0.9 % IV BOLUS FROM BAG
0.9 | INTRAVENOUS | Status: CP
Start: 2023-09-25 — End: ?
  Administered 2023-09-25: 12:00:00 0.9 mL/h via INTRAVENOUS

## 2023-09-25 MED ORDER — VANCOMYCIN 125 MG CAPSULE
125 mg | Freq: Four times a day (QID) | ORAL | Status: DC
Start: 2023-09-25 — End: 2023-09-26

## 2023-09-25 MED ORDER — PROCHLORPERAZINE MALEATE 5 MG TABLET
5 mg | Freq: Four times a day (QID) | ORAL | Status: AC | PRN
Start: 2023-09-25 — End: 2023-09-30

## 2023-09-25 MED ORDER — ACETAMINOPHEN 325 MG TABLET
325 mg | Freq: Three times a day (TID) | ORAL | Status: SS | PRN
Start: 2023-09-25 — End: ?

## 2023-09-25 MED ORDER — SODIUM CHLORIDE 0.9 % IV BOLUS FROM BAG
0.9 % | INTRAVENOUS | Status: DC
Start: 2023-09-25 — End: 2023-09-25

## 2023-09-25 MED ORDER — FERROUS GLUCONATE 324 MG (38 MG IRON) TABLET
32438 mg (38 mg iron) | Freq: Every day | ORAL | Status: SS
Start: 2023-09-25 — End: 2023-09-30

## 2023-09-25 MED ORDER — CEFTAZIDIME/AVIBACTAM 2.5 GM MBP
Freq: Three times a day (TID) | INTRAVENOUS | Status: DC
Start: 2023-09-25 — End: 2023-09-27
  Administered 2023-09-26 – 2023-09-27 (×5): 50.000 mL/h via INTRAVENOUS

## 2023-09-25 MED ORDER — LACTATED RINGERS IV BOLUS (NEW BAG)
Freq: Once | INTRAVENOUS | Status: CP
Start: 2023-09-25 — End: ?
  Administered 2023-09-26: 02:00:00 1000.000 mL/h via INTRAVENOUS

## 2023-09-25 MED ORDER — SODIUM CHLORIDE 0.9 % INTRAVENOUS SOLUTION
INTRAVENOUS | Status: DC
Start: 2023-09-25 — End: 2023-09-25
  Administered 2023-09-25 (×2): via INTRAVENOUS

## 2023-09-25 MED ORDER — VANCOMYCIN 125 MG CAPSULE
125 mg | Freq: Four times a day (QID) | ORAL | Status: DC
Start: 2023-09-25 — End: 2023-09-26
  Administered 2023-09-26 (×3): 125 mg via ORAL

## 2023-09-25 NOTE — Plan of Care
 Plan of Care Overview/ Patient Status    Spoke to patient at the bedside regarding discharge plan for 10/5. Patient is in agreement. IM discussed and patient does not plan to appeal. BA to book transportation to [facility] at 11 AM on 10/5. 2 PM-Discharge cancelled for 10/5. Patient is not medically stable. BA to cancel transport. CM will continue to follow.Ernestina Columbia, RN, BSNNurse Case [facility]

## 2023-09-25 NOTE — Progress Notes
 [facility] Medicine Progress NoteAttending Provider: Lyndee Leo, DO Subjective                                                                              Subjective: Interim History: no acute overnight events. Patient states she is feeling pain on her left flank, BP was lower and was given 500cc NS bolus. WBC elevated in the 40s, repeated with similar result. Discussed with Dr. Hulen Luster, checking [place] AP c+ to rule out complication. Review of Allergies/Meds/Hx: Review of Allergies/Meds/Hx:I have reviewed the patient's: allergies, current scheduled medications, current infusions, current prn medications, past medical history, past surgical history, family history, social history, and prior to admission medications Objective Objective: Vitals:Last 24 hours: Temp:  [97.1 ?F (36.2 ?C)-99 ?F (37.2 ?C)] 97.1 ?F (36.2 ?C)Pulse:  [70-103] 88Resp:  [11-20] 19BP: (83-130)/(46-64) 94/57SpO2:  [96 %-100 %] 99 %I/O's:Gross Totals (Last 24 hours) at 09/25/2023 1446Last data filed at 09/25/2023 0600Intake 5636.72 ml Output 300 ml Net 5336.72 ml Physical ExamConstitutional:     Appearance: She is ill-appearing. HENT:    Head: Normocephalic and atraumatic. Cardiovascular:    Rate and Rhythm: Normal rate and regular rhythm.    Pulses: Normal pulses.    Heart sounds: Normal heart sounds. Pulmonary:    Effort: Pulmonary effort is normal.    Breath sounds: Normal breath sounds. Abdominal:    General: Abdomen is flat. Bowel sounds are normal.    Palpations: Abdomen is soft.    Comments: Colostomy bag in place Skin:   General: Skin is warm and dry. Neurological:    General: No focal deficit present.    Mental Status: She is alert and oriented to person, place, and time. Mental status is at baseline.    Comments: Lower extremity weakness  Labs:Last 24 hours: Recent Results (from the past 24 hour(s)) CBC auto differential  Collection Time: 09/24/23  5:37 PM Result Value Ref Range  WBC 6.6 4.0 - 11.0 x1000/?L  RBC 3.56 (L) 4.00 - 6.00 M/?L  Hemoglobin 9.9 (L) 11.7 - 15.5 g/dL  Hematocrit 45.40 (L) 98.11 - 45.00 %  MCV 91.0 80.0 - 100.0 fL  MCH 27.8 27.0 - 33.0 pg  MCHC 30.6 (L) 31.0 - 36.0 g/dL  RDW-CV 91.4 (H) 78.2 - 15.0 %  Platelets 192 150 - 420 x1000/?L  MPV 9.1 8.0 - 12.0 fL  Neutrophils 96.9 (H) 39.0 - 72.0 %  Lymphocytes 2.1 (L) 17.0 - 50.0 %  Monocytes 0.3 (L) 4.0 - 12.0 %  Eosinophils 0.2 0.0 - 5.0 %  Basophil 0.2 0.0 - 1.4 %  Immature Granulocytes 0.3 0.0 - 1.0 %  nRBC 0.0 0.0 - 1.0 %  Absolute Lymphocyte Count 0.14 (L) 0.60 - 3.70 x 1000/?L  Monocyte Absolute Count 0.02 0.00 - 1.00 x 1000/?L  Eosinophil Absolute Count 0.01 0.00 - 1.00 x 1000/?L  Basophil Absolute Count 0.01 0.00 - 1.00 x 1000/?L  Absolute Immature Granulocyte Count 0.02 0.00 - 0.30 x 1000/?L  Absolute nRBC 0.00 0.00 - 1.00 x 1000/?L  ANC (Abs Neutrophil Count) 6.38 2.00 - 7.60 x 1000/?L Basic metabolic panel  Collection Time: 09/24/23  5:37 PM Result Value Ref Range  Sodium 145 136 - 145 mmol/L  Potassium 3.7 3.5 - 5.1 mmol/L  Chloride 121 (H) 95 - 115 mmol/L  CO2 20 (L) 21 - 32 mmol/L  Anion Gap 4 (L) 5 - 18  Glucose 111 (H) 70 - 100 mg/dL  BUN 12 8 - 25 mg/dL  Creatinine 1.61 0.96 - 1.30 mg/dL  Calcium 9.2 8.4 - 04.5 mg/dL  BUN/Creatinine Ratio 40.9 8.0 - 25.0  Osmolality Calculation 289 275 - 295 mOsm/kg  eGFR (Creatinine) >60 >=60 mL/min/1.21m2 CBC auto differential  Collection Time: 09/25/23  9:05 AM Result Value Ref Range  WBC 44.3 (H) 4.0 - 11.0 x1000/?L  RBC 3.23 (L) 4.00 - 6.00 M/?L  Hemoglobin 9.2 (L) 11.7 - 15.5 g/dL  Hematocrit 81.19 (L) 14.78 - 45.00 %  MCV 89.2 80.0 - 100.0 fL  MCH 28.5 27.0 - 33.0 pg  MCHC 31.9 31.0 - 36.0 g/dL  RDW-CV 29.5 (H) 62.1 - 15.0 %  Platelets 331 150 - 420 x1000/?L  MPV 9.5 8.0 - 12.0 fL nRBC 0.0 0.0 - 1.0 %  Absolute nRBC 0.00 0.00 - 1.00 x 1000/?L Comprehensive metabolic panel  Collection Time: 09/25/23  9:05 AM Result Value Ref Range  Sodium 145 136 - 145 mmol/L  Potassium 3.3 (L) 3.5 - 5.1 mmol/L  Chloride 120 (H) 95 - 115 mmol/L  CO2 20 (L) 21 - 32 mmol/L  Anion Gap 5 5 - 18  Glucose 98 70 - 100 mg/dL  BUN 14 8 - 25 mg/dL  Creatinine 3.08 6.57 - 1.30 mg/dL  Calcium 9.0 8.4 - 84.6 mg/dL  BUN/Creatinine Ratio 96.2 8.0 - 25.0  Total Protein 6.8 6.4 - 8.2 g/dL  Albumin 2.2 (L) 3.4 - 5.0 g/dL  Total Bilirubin 0.2 0.0 - 1.0 mg/dL  Alkaline Phosphatase 61 20 - 120 U/L  Alanine Aminotransferase (ALT) 11 (L) 12 - 78 U/L  Aspartate Aminotransferase (AST) 30 5 - 37 U/L  Globulin 4.6 g/dL  A/G Ratio 0.5   AST/ALT Ratio 2.7 Reference Range Not Established  Osmolality Calculation 289 275 - 295 mOsm/kg  eGFR (Creatinine) 54 (L) >=60 mL/min/1.52m2 Manual Differential  Collection Time: 09/25/23  9:05 AM Result Value Ref Range  Neutrophils 70.6 39.0 - 72.0 %  Bands 19.3 (H) 0.0 - 10.0 %  Lymphocytes 0.8 (L) 17.0 - 50.0 %  Monocytes 2.5 (L) 4.0 - 12.0 %  Eosinophils 0.0 0.0 - 5.0 %  Basophil 0.9 0.0 - 1.4 %  Metamyelocytes 5.1 (H) 0.0 - 1.0 %  Atypical Lymphocytes 0.8 0.0 - 1.0 %  Neutrophils Absolute 39.80 (H) 2.00 - 7.60 x 1000/?L  Lymphocyte Absolute 0.35 (L) 0.60 - 3.70 x 1000/?L  Monocyte Absolute Count 1.11 (H) 0.00 - 1.00 x 1000/?L  Eosinophil Absolute Count 0.00 0.00 - 1.00 x 1000/?L  Basophil Absolute Count 0.40 0.00 - 1.00 x 1000/?L  RBC Morphology Reviewed   Ovalocytes 1+ (A) None C-reactive protein (CRP)  Collection Time: 09/25/23  9:05 AM Result Value Ref Range  C-Reactive Protein 12.6 (H) 0.0 - 1.0 mg/dL CBC auto differential  Collection Time: 09/25/23 12:05 PM Result Value Ref Range  WBC 46.6 (H) 4.0 - 11.0 x1000/?L  RBC 3.16 (L) 4.00 - 6.00 M/?L  Hemoglobin 8.8 (L) 11.7 - 15.5 g/dL Hematocrit 95.28 (L) 41.32 - 45.00 %  MCV 91.1 80.0 - 100.0 fL  MCH 27.8 27.0 - 33.0 pg  MCHC 30.6 (L) 31.0 - 36.0 g/dL  RDW-CV 44.0 (H) 10.2 - 15.0 %  Platelets 328 150 - 420 x1000/?L  MPV 9.5 8.0 - 12.0 fL  nRBC 0.0 0.0 - 1.0 %  Absolute nRBC 0.00 0.00 - 1.00 x 1000/?L Manual Differential  Collection Time: 09/25/23 12:05 PM Result Value Ref Range  Neutrophils 83.4 (H) 39.0 - 72.0 %  Bands 7.5 0.0 - 10.0 %  Lymphocytes 5.8 (L) 17.0 - 50.0 %  Monocytes 3.3 (L) 4.0 - 12.0 %  Eosinophils 0.0 0.0 - 5.0 %  Basophil 0.0 0.0 - 1.4 %  Neutrophils Absolute 42.40 (H) 2.00 - 7.60 x 1000/?L  Lymphocyte Absolute 2.71 0.60 - 3.70 x 1000/?L  Monocyte Absolute Count 1.54 (H) 0.00 - 1.00 x 1000/?L  Eosinophil Absolute Count 0.00 0.00 - 1.00 x 1000/?L  Basophil Absolute Count 0.00 0.00 - 1.00 x 1000/?L  RBC Morphology Reviewed   Burr Cells 1+ (A) None  Ovalocytes 1+ (A) None  Giant PLTs Present None Lactic acid, plasma (reflex 2h repeat)  Collection Time: 09/25/23  1:13 PM Result Value Ref Range  Lactate 2.9 (H) 0.4 - 2.0 mmol/L Diagnostics: Assessment Assessment: Assessment:Principal Problem:  Sepsis, due to unspecified organism, unspecified whether acute organ dysfunction present (HC Code) (HC CODE) (HC Code)  SNOMED [place](R): SEPSIS  Active Problems:  UTI (urinary tract infection)  SNOMED [place](R): URINARY TRACT INFECTIOUS DISEASE    S/P cystoscopy with ureteral stent placement  SNOMED [place](R): HISTORY OF INSERTION OF STENT INTO URETER    Plan Plan: Patient is a 67 year old woman with PMHx of cognitive impairment, hx DVT, RA, 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 Patient seen by urology, pending left stent exchange on Thursday 10/3. Midline placed 9/30, eliquis transitioned to lovenox for procedure. Patient had procedure 10/3, POD#1 with leukocytosis of 44, checking [place] AP today.# Leukocytosis- leukemoid reaction? Procedure complication?- s/p 500cc NS bolus, repeat 500cc bolus again due to elevated LA 2.9- checking [place] AP c+- continue ertapenem- ID consult  # Sepsis secondary to acute pyeloureteritis with E coli (ESBL) bacteremia - 07/15/2023: right stent was removed  and left stent was advanced to the renal pelvis, small perforation seen, mild extravasation.- Has been having intermittent drops in blood pressures that have been fluid responsive. - ICU evaluation completed by Dr. Raina Mina on 9/24. - Urology note by Dr. Hulen Luster 9/24: left pyelonephritis excellent stent placement stone is not visualizedMaybe passHopefully this hospitalization early next week we can consider ureteroscopy see if the stone is still there and removal of stent- Blood cx with E coli ESBL . Urine cx the same- repeat blood culture 9/29 NGTD- Continue ertapenem, day 11- s/p 10/3 ureteroscopy stent exchange destruction of stones # Acute on chronic anemia - no signs of bleeding and stool occult negative- partly dilutional due to IV fluid and inflammation from sepsis- s/p 1 unit PRBC 10/3- ferrous gluconate 324mg  every other day- stool occult blood negative # Hx DVT - restart eliquis # Presence of ostomy - Routine ostomy care # Hypokalemia- replete # HLD- continue crestor 10mg  once daily Other issues stable Hx RAHx peripheral neuropathy - gabapentin dose reduced.  ====* Regular diet* DVT ppx: eliquis* Full code Patient was seen and examined on 09/25/2023 Comorbidities Comorbidities present on admission:Coagulopathic due to prescription of anticoagulant medication prior to admission.   Secondary  diagnoses occurring during hospitalization:Hyponatremia Electronically Signed:Eileen Croswell, DO Beeper 225310/03/2023, 2:45 PM

## 2023-09-25 NOTE — Plan of Care
 09/24/23 1327 09/24/23 1344 09/24/23 1346 Patient Observation Observation Pt into PACU in bed-Dr Christowsky in attendance-blood transfusion started in OR-to Rt upper PICC line-infusing slowly,pt is sleeping at this time-VSS-pt had cysto stent to left-kidney stone laser lithotripsy-pt is on dry pad at this time-Rt lower colostomy draining liquid stool small-mad as comfortable in bed monitoringtransfusion pt shivering feeling cold-warm blankets placed BP cuff slipping down wrist adjusted to left upper arm  09/24/23 1434 09/24/23 1440 09/24/23 1444 Patient Observation Observation Pt urinated soaked pads, pt fully washed up.rolled side to side with c/o pain-moaning, noted shivering-bair huggar placed for 106minutes-temps monitored closely-dilaudid 0.2mg  iv given with good effects Tylenol IV going in on Left lower wrist IV site-flushed well-infusing slowly Pt is now awake alert-comfortable back pain is now down to a 4-previous 8  09/24/23 1445 09/24/23 1500 09/24/23 1538 Patient Observation Observation Bair huggar shut off now because blood transfusion infusing at 150cc/hr Rt upper PICC line all intact-site good-VSS tolerating ice chips now one at a time Back pains come and go-positioned legs on pillow holding pain meds at this time BP 89/51  09/24/23 1542 09/24/23 1549 09/24/23 1615 Patient Observation Observation blood off pump open now has been infusing 2hrs-pt resting quietly now BP 90/51 BP 91/54-open infusing blood-pt sleeping any further dilaudid held-pt is having no pain-HR 76-sats 100% Kathleen-RN over to help wash up pt-called Surgical PA  09/24/23 1645 09/24/23 1700 Patient Observation Observation Dr Cho-hospitalists also called alerted of pt condition-she is satified with pt progression and would like her to return to room Medicine Pt is easily awaken-BP 116/64 now-Dr Chrostowsky had called Dr Reita Chard to PACU to assess pt earlier-pt is now stable ready for transferre-pt is now washed up clean-dry small bleeding noted in urine-made as comfortable as possible-IV team has been called to flush PICC line and draw post op labs either in PACU or on floor care-blood finished no reaction noted-pt is relaxed comfortable-taken back to Med -B in bed-report given Radka-RN Plan of Care Overview/ Patient Status

## 2023-09-25 NOTE — Other
 [facility] REPORTPat Name:  Danielle [name], Danielle [name] No:  [name] D.O.B.:  Dec 16, 1957Dict MD:  [name], M.D.Attend MD:  Alfredo Bach MD:   Adm/Ser DT:  09/23/2024Disch DT:   CSN:  [id number] Pat Locat:  [id number] DATE:  10/03/2024SURGEON:  [name], M.D.PREOPERATIVE DIAGNOSIS:Impacted mid ureteral stone.POSTOPERATIVE DIAGNOSIS:Ureteral stricture.ANESTHESIA:General.INDICATIONS:A 67 year old female who has history of bilateral renal stones.  She has had history of stones on the right, which passed.  She had a stone on the left that was asymptomatic, found when she was passing the right stone.  A stent was placed up there when she was admitted with acute renal failure.  Subsequently, left ureteroscopy was attempted a couple of months ago was unsuccessful due to significant stricture and impaction.  The patient was brought back to the operating room 6 weeks later for evaluation.PROCEDURE IN DETAIL:The patient was brought to the operating room, dorsal lithotomy position, Betadine boots placed.  Positioning was difficult due to body habitus.  At this point, a cystoscopy was performed using a 30-degree lens 22-French cystoscope.  Urine was sent for culture.  The 62-month long contour stent was noncalcified.  2 Glidewires were advanced up to stent and through the open ended and into the stent.  Two wires were in the ureter.  At this point, a semirigid scope was advanced.  There was some moderate difficulty in the mid ureter due to previous strictures and narrowing.  Another wire was advanced to help [place] find the lumen in the stone.  The stone appeared to be impacted and appeared to be submucosal.  At this point, a 400 micron fiber was used to cut and chisel the stone off the submucosa.  Multiple fragments were removed and retrieved.  Retrograde did not show extravasation.  This is a difficult ureteroscopy due to the patient's body habitus.  There was a lot of torquing in the ureter, and previous ureteroscopy was also difficult.  At this point, small fragments were removed.  Retrograde did not confirm any extravasation.  A 7-French 26 stent was placed without difficulty long acting.PLAN:Most likely still needed for 6 weeks and removing down the line.Job #:  [id number] D:  09/24/2023 13:49 T:  09/24/2023 [id number] MEDQ  **Information in this report should not be considered final until authenticated by the author and should not be re-disclosed.**

## 2023-09-25 NOTE — Discharge Instructions
Ureteral StentA ureteral stent is a temporary, thin, soft, plastic tube that helps maintain the flow of urine through the ureter.  Stents are placed in the operating room to unblock the ureter and are routinely used after ureteroscopy.  The stent is completely inside (you cannot see it) with one end sitting in the kidney and the other in the bladder.  All stents must be removed before 3-4 months otherwise you will risk infections, stones, or kidney damage. What to Expect at HomeAfter a stent is placed, you may feel a pulling sensation when you urinate, a spasm at the end of urination or the urge to urinate more often.  It is normal to have some blood in your urine as long as you have the stent. It can cause urinary frequency (feeling like you have to urinate often) or urgency (feeling like you need to run to the bathroom), burning when urinating, pain, or blood in the urine (which can be intermittent). If you hold your urine too long, you may notice a dull ache in your back.  Be sure to drink plenty of fluids and use the bathroom frequently. If you are experiencing moderate stent discomfort, place a warm compress or hot water bottle on you back or belly where you ache. A hot bath or shower may also offer relief. Ibuprofen (400mg every 6 hours, with food) or your prescription pain medication can be used as needed.   Stent RemovalHow long you will require your stent depends on the reason it was placed. We will tell you when your stent must be removed or exchanged after surgery (check your discharge instruction). The stent is soft and generally comes out easily.If there is a string attached:The urology team will tell you when if and when you are to remove the stent. If you are removing the stent yourself, a string may be tied to the stent with the other end taped to your skin.  To remove the stent, peel off the tape and steadily pull the string (count as you pull - it should be out by 3). It is best to do this in the shower since the stent will be a little wet when it comes out.  Take ibuprofen or tylenol (as long as you have no contraindication) and prescription pain medication 30 minutes prior to removing the stent since you can feel a spasm after the stent is removed. If there is no string: If the stent is in for longer (you will not see a string), we will remove it in the office using a small, flexible camera (cystoscope).  When to Call Your Doctor Fever over 101o FSevere pain uncontrolled by medicationNausea and vomitingBleeding with clots in urineInability to pass urineIf you have any questions or concerns

## 2023-09-25 NOTE — Progress Notes
 Sp urs stent  HPICrystal A Colvin is a 67 y.o. female  status post bilateral stent placement for urosepsisPatient had passed a right ureteral stone has not asymptomatic left ureteral stonePatient more alert todayUrine blood-tinged07/07/2023 patient looks reasonably well with a clear understanding what is going onPatient with Klebsiella and urine of presses urine blood-tinged improving\WBC down to 7.9 from 22 creatinine 0.757/11/2023 sp bilateral stent passed stone r uretr has left midOn ceftin sepsis PAR for urs on left remove stent rightUa wbc  09/15/2023 patient is status post bilateral ureteral obstruction urgent bilateral stent placedPatient did have right ureteral stone Past had a difficult ureteroscopy on the left side secondary to 4 mm stone in the mid proximal ureterCurrently has stent placed since middle of JulyAdmitted for fever sepsisHopefully during this hospitalization will require ureteroscopy probably next week and recommendations to follow10/01/2023 schedle cysto urs in am PAR all discussed10/03/2023 sp urs with stone extraction Pt no issues but wbc 46!!No fever low BPLytes normalCt some mild fluid and mild hydro stent good position fluid around kidney HPI Past Medical HistoryPast Medical History    Past Medical History: Diagnosis Date  Acute embolism and thrombosis of unspecified femoral vein (HC Code)    Anemia    Cellulitis and abscess of right lower extremity    Dysphagia    Encephalopathy    GERD (gastroesophageal reflux disease)    Hypercalcemia    Paraplegia (HC Code)    Rheumatoid arthritis (HC Code)     Past Surgical HistoryPast Surgical History     Past Surgical History: Procedure Laterality Date  BAND HEMORRHOIDECTOMY      CYSTECTOMY   1972  LAPAROSCOPIC SALPINGOOPHERECTOMY   1986  LAPAROTOMY   2022  PARTIAL COLECTOMY   2022  PEG TUBE PLACEMENT   01/2022  UPPER GASTROINTESTINAL ENDOSCOPY       AllergiesNo Known [email or web address] @ Social HistorySocial History     Tobacco Use  Smoking status: Never  Smokeless tobacco: Never  Family History No family history on file. Review of SystemsNo symptoms beyond those noted in HPI and PMH- all other systems are negative Physical ExamBP 103/66  - Pulse 78  - Temp 99.8 ?F (37.7 ?C) (Temporal)  - Resp 20  - Ht 5' 10 (1.778 m)  - Wt 65.6 kg  - SpO2 95%  - BMI 20.75 kg/m?      Lab Results Component Value Date   WBC 7.9 06/29/2023   HGB 9.1 (L) 06/29/2023   HCT 28.80 (L) 06/29/2023   PLT 176 06/29/2023   ALT 8 (L) 06/29/2023   AST 9 06/29/2023   NA 144 06/29/2023   K 3.2 (L) 06/29/2023   CL 116 (H) 06/29/2023   CREATININE 0.75 06/29/2023   BUN 9 06/29/2023   CO2 23 06/29/2023   TSH 0.527 03/09/2023   INR 1.08 06/28/2023   GLU 88 06/29/2023   HGBA1C 5.5 03/09/2023   No results found for: UCOLOR, UGLUCOSE, UKETONE, USPECGRAVITY, UPH, UPROTEIN, UNITRATES, UBLOOD, ULEUKOCYTES, UA  No results found for: PVRPOCT     Previous resultsNo results found for: TESTOSTERONE  AUA Symptom Score today:       No data to display        PATHOLOGY AND BIOPSIES   TEST RESULTS   Images     DISCUSSION       FL OR Urography Retrograde Result Date: 06/27/2023 Intraoperative fluoroscopy was utilized for bilateral retrograde injections and bilateral ureteral stent placements. Reported and signed by:  Carolan Clines, MD  [place]  Abdomen Pelvis wo IV Contrast (No Oral)  (Renal Stone) Result Date: 06/26/2023 1. Moderate obstruction of the right kidney from a 6, right UVJ calculus. 2. Nonobstructing renal calculi. 3. Gallstones with trace pericholecystic fluid could be secondary to the adjacent inflammatory change. Cholecystitis cannot be entirely excluded. 4. Postoperative changes with extensive colonic diverticulosis. Reported and signed by:  Carolan Clines, MD  XR Chest PA or AP Result Date: 7/5/2024Limited AP view- No acute abnormality. [facility] Radiology Notify System Classification: Routine. Reported and signed by:  Mirian Mo  US Duplex Lower Extremity Venous Bilat Result Date: 06/19/2023 No DVT of the right or left lower extremity. [facility] Radiology Notify System Classification: Routine. Reported and signed by:  Lonia Chimera, MD  [place] Abdomen Pelvis w IV Contrast (No Oral) Result Date: 05/24/2023  1. Extensive colonic diverticulosis without [place] evidence for diverticulitis. No definite contrast extravasation is seen to identify a source of bleeding. 2. Nonobstructing right renal calculi. Delayed left nephrogram with a new 5 mm calculus projecting at the level of the left iliac crest highly suspicious for a left ureteral calculus Dr. Alena Bills discussed these findings with [name], PA at 2:56 AM. I discussed these findings with Dr. Rinaldo Ratel at 9:30 AM. Radiology Notify System Classification: Clinical team aware. Individual dose optimization technique was used for the performance of this procedure, and one or more of the following dose reduction techniques was used: Automated exposure control and/or adjustment of the MA and/or kV according to patient size and/or the use of iterative reconstruction technique. Reported and signed by:  Geroge Baseman, MD  CXR portable Result Date: 03/09/2023  No active cardiopulmonary abnormalities.  No change. [facility]: Routine. Reported and signed by: Amie Critchley, MD    Assessment & Plan: IVEY NEMBHARD is a 67 y.o  no extravastion stent normal positionNo extracasCT not explaining 45 wbc wo fever etcWould ro other issuesIf issues repeat sono withfoley in bladder

## 2023-09-25 NOTE — Plan of Care
 Plan of Care Overview/ Patient Status    Assumed care at 0700. Pt AOX3. VSS on RA except soft BP, denies chest pain, dizziness or SOB. MD Cho aware ordered boluses of fluid & labs. Noted with elevated WBC, MD cho aware & ordered stat [place] Abdomen. Seen by Dr. Hulen Luster. Ileostomy patent draining dark green liquid, dressing changed done. Due meds per MAR. MD Cho ordered to hold gabapentin. Incontinent care provided. Call light in reach. POC ongoing. Problem: Diabetes ComorbidityGoal: Blood Glucose Level Within Targeted RangeOutcome: Interventions implemented as appropriate Problem: Violence Risk or ActualGoal: Anger and Impulse ControlOutcome: Interventions implemented as appropriate Problem: Adult Inpatient Plan of CareGoal: Plan of Care ReviewOutcome: Interventions implemented as appropriateGoal: Patient-Specific Goal (Individualized)Outcome: Interventions implemented as appropriateGoal: Absence of Hospital-Acquired Illness or InjuryOutcome: Interventions implemented as appropriateGoal: Optimal Comfort and WellbeingOutcome: Interventions implemented as appropriateGoal: Readiness for Transition of CareOutcome: Interventions implemented as appropriate Problem: Fall Injury RiskGoal: Absence of Fall and Fall-Related InjuryOutcome: Interventions implemented as appropriate Problem: Wound Healing ProgressionGoal: Optimal Wound HealingOutcome: Interventions implemented as appropriate Problem: Skin Injury Risk IncreasedGoal: Skin Health and IntegrityOutcome: Interventions implemented as appropriate Problem: InfectionGoal: Absence of Infection Signs and SymptomsOutcome: Interventions implemented as appropriate Problem: Physical Therapy GoalsGoal: Physical Therapy GoalsDescription: 1) bed mob w/ minx12) txfrs w/ RW mod a x2Outcome: Interventions implemented as appropriate

## 2023-09-25 NOTE — Other
 PHARMACY-ASSISTED MEDICATION REPORTPharmacist review of the best possible medication history obtained by the pharmacy medication history technician has been performed.  I have updated the home medication list and identified the following information that may be relevant to this admission. Recommendations:  None. Notes:None.Home medication list updated and reviewed. Medication list was updated to best possible state. Patient was not interviewed as patient arrived from SNF/REHAB/Outside hospital with W-10. The following resources were utilized to confirm the list: Transfer record (W-10) - Facility name : [facility] date:09/14/23.Current Medications Medication Sig Note  acetaminophen (TYLENOL) 325 mg tablet Take 3 tablets (975 mg total) by mouth every 8 (eight) hours as needed for pain.   apixaban (ELIQUIS) 5 mg tablet 1 tablet (5 mg total) by Per G Tube route every 12 (twelve) hours.   ferrous gluconate (FERGON) 324 mg (38 mg iron) tablet Take 1 tablet (324 mg total) by mouth daily.   folic acid (FOLVITE) 1 mg tablet Take 1 tablet (1 mg total) by mouth daily.   gabapentin (NEURONTIN) 400 mg capsule Take 1 capsule (400 mg total) by mouth 2 (two) times daily with breakfast and dinner.   gabapentin (NEURONTIN) 600 mg tablet Take 1 tablet (600 mg total) by mouth nightly.   hydrOXYchloroQUINE (PLAQUENIL) 200 mg tablet Take 1 tablet (200 mg total) by mouth daily.   magnesium hydroxide (MILK OF MAGNESIA) 400 mg/5 mL suspension Take 30 mLs by mouth daily as needed for constipation.   melatonin 3 mg tablet Take 1 tablet (3 mg total) by mouth nightly.   pantoprazole (PROTONIX) 40 mg tablet Take 1 tablet (40 mg total) by mouth daily. 09/25/2023: [employer](Kimberley Azucena Kuba, CPHT):  per D63 this dose is on hold   pantoprazole (PROTONIX) 40 mg tablet Take 1 tablet (40 mg total) by mouth 2 (two) times daily.   polyethylene glycol (MIRALAX) 17 gram packet Take 1 packet (17 g total) by mouth daily. Mix in 8 ounces of water, juice, soda, coffee or tea prior to taking.   potassium citrate (UROCIT-K) 15 mEq extended release tablet Take 1 tablet (15 mEq total) by mouth 2 (two) times daily.   rosuvastatin (CRESTOR) 10 mg tablet 1 tablet (10 mg total) by Per G Tube route nightly.   traMADoL (ULTRAM) 50 mg tablet Take 1 tablet (50 mg total) by mouth every 8 (eight) hours as needed for pain.  Pearson Grippe, PharmD5:07 PM10/4/2024Phone: Available via [employer] ([facility])

## 2023-09-25 NOTE — Other
 [facility]		Infectious Disease Consult Note Patient Data:  Patient Name: Danielle [name] Age: 67 y.o. DOB: Apr 07, 1956	 MRN: [name]	 Consult Information: Consultation requested by: Lyndee Leo, Cathleen Fears for consultation:  Known ESBL bacteremia from urine.  New leukocytosis from unclear sourceSource of Information: Patient, Doctor, and EMR/Previous RecordPresentation History: HPI: Pt is a 67 yo F w/ PMH of rheumatoid arthritis on HCQ, DVT on apixaban, perforated diverticulitis s/p right hemicolectomy/ileostomy (2023), IDA, peripheral neuropathy, cognitive impairment, and ESBL urosepsis 2/2 obstructing stone s/p ureteral stents (06/2023) who was admitted on 09/23 with sepsis.Previously saw patient during 2nd admission in 06/2023.  Please see my notes from that time for details.  First [facility] admission (7/5-7/10) for septic shock secondary to an obstructing right ureteral stone.  Required pressors in ICU.  Underwent bilateral ureteral stent placement on 07/05.  Blood and urine cultures grew susceptible Klebsiella pneumoniae.  Treated with ceftriaxone followed by oral cefuroxime to complete a 10 day course (end date 07/14).  Outpatient imaging showed passing of the right stone, but required 2nd urologic procedure for management of the left stone.  Outpatient urine culture grew ESBL E coli.  Underwent cystoscopy with right stent removal and replacement of left ureteral stent on 07/25.  Surgery complicated by small left ureter perforation while attempting to remove the stone which was unsuccessful.  Started on ertapenem then switch to oral Bactrim on discharge and completed a 2 week total course.  Patient did not follow up with Dr. Hulen Luster as an outpatient for [place] scan followed by stent removal.Patient resides at [facility].  Noted to be more lethargic and have positive stool guaiac on 09/23.  Dr. Nedra Hai was contacted; apixaban stopped.  Of note, documentation indicates patient's leg was very hot to touch.  Plan was for repeat Doppler in 4 weeks.  Mental status and lethargy worsened, which was reason for presentation to [facility] ER later that day.  Found to be febrile with leukocytosis.  [place] scan showed acute pyeloureteritis, cystitis, moderate left hydroureteronephrosis, and bilateral nonobstructing renal stones (left lower pole).  Initially started on vancomycin and Zosyn.  Admission blood and urine cultures grew ESBL E coli.  Switch to ertapenem on 09/24 clinically responded to antibiotics; defervesced, leukocytosis resolved, inflammatory markers improved.  Midline placed on 09/30 S patient was a difficult stick.  Intermittent soft blood pressure over hospitalization that responded to IV fluids.  Urology consulted who recommended stent exchange and removal of impacted mid ureteral stone. Dr. Hulen Luster performed cystoscopy with left ureteral stent replacement and removal of stone.  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  Immediate postop course uncomplicated.  However, the next day patient's white count became markedly elevated.  ID consulted for further evaluation.Saw patient this afternoon at bedside.  Alert and able to answer questions appropriately.  Main complaint was left buttock pain which has been going on for awhile.  No other complaints.  No stool passed per rectum.D11 ertapenem after 2 doses of Zosyn and 1 dose of vancomycinAfebrile PCT-161.27CRP-12.6 ESR-60WBC-40.4 (peak-46.6 on 10/4).  Bandemia resolvedMicro:Urine culture (9/23-clean-catch):  ESBL E coliUrine culture (9/30-straight cath): No growthOR urine culture (10/3-bladder):  Pending Blood culture (9/23):  ESBL E coliBlood culture (9/29):  No growth to dateBlood culture (10/4): PendingReview of Allergies/Medical History/Medications: I have reviewed the patient's allergies, prior to admission meds, past medical/surgical, family and social hx. 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 She has never used smokeless tobacco. She reports that she does not currently use alcohol. She reports that she does not use drugs.Prior to Admission Medications Medications Prior to Admission Medication Sig Dispense Refill Last Dose  acetaminophen (TYLENOL) 325 mg tablet Take 3 tablets (975 mg total) by mouth every 8 (eight) hours as needed for pain.     apixaban (ELIQUIS) 5 mg tablet 1 tablet (5 mg total) by Per G Tube route every 12 (twelve) hours.   09/13/2023 at 2100  ferrous gluconate (FERGON) 324 mg (38 mg iron) tablet Take 1 tablet (324 mg total) by mouth daily.     folic acid (FOLVITE) 1 mg tablet Take 1 tablet (1 mg total) by mouth daily. 30 tablet 11 09/14/2023 at 0900  gabapentin (NEURONTIN) 400 mg capsule Take 1 capsule (400 mg total) by mouth 2 (two) times daily with breakfast and dinner.   09/14/2023 at 1700  gabapentin (NEURONTIN) 600 mg tablet Take 1 tablet (600 mg total) by mouth nightly.   09/13/2023 at 2100  hydrOXYchloroQUINE (PLAQUENIL) 200 mg tablet Take 1 tablet (200 mg total) by mouth daily. 14 tablet 0 09/14/2023 at 0900  magnesium hydroxide (MILK OF MAGNESIA) 400 mg/5 mL suspension Take 30 mLs by mouth daily as needed for constipation.     melatonin 3 mg tablet Take 1 tablet (3 mg total) by mouth nightly.  0 09/13/2023 at 2100  pantoprazole (PROTONIX) 40 mg tablet Take 1 tablet (40 mg total) by mouth daily. 30 tablet 11 09/14/2023 at 0600  pantoprazole (PROTONIX) 40 mg tablet Take 1 tablet (40 mg total) by mouth 2 (two) times daily.   09/14/2023 at 1700  polyethylene glycol (MIRALAX) 17 gram packet Take 1 packet (17 g total) by mouth daily. Mix in 8 ounces of water, juice, soda, coffee or tea prior to taking. 14 each 2 09/14/2023 at 0900  potassium citrate (UROCIT-K) 15 mEq extended release tablet Take 1 tablet (15 mEq total) by mouth 2 (two) times daily.   09/14/2023 at 1700  rosuvastatin (CRESTOR) 10 mg tablet 1 tablet (10 mg total) by Per G Tube route nightly.   09/14/2023 at 1700  traMADoL (ULTRAM) 50 mg tablet Take 1 tablet (50 mg total) by mouth every 8 (eight) hours as needed for pain.    Inpatient Medications:I have reviewed the patient's current medication orders.Current Facility-Administered Medications Medication Dose Route Frequency Provider Last Rate Last Admin  apixaban (ELIQUIS) tablet 5 mg  5 mg Oral Q12H Cho, Eunna, DO      chlorhexidine gluconate (HIBICLENS/BETASEPT) 4 % topical liquid   Topical (Top) Daily Cho, Eunna, DO   Given at 09/25/23 0933  ertapenem (INVanz) 1,000 mg in sodium chloride 0.9% 50 mL (mini-bag plus)  1,000 mg Intravenous Q24H Les Pou, MD 100 mL/hr at 09/25/23 0931 1,000 mg at 09/25/23 0931  famotidine (PEPCID)  tablet 20 mg  20 mg Oral Daily Hallac, Alexander, MD   20 mg at 09/25/23 0912  ferrous gluconate (FERGON) tablet 324 mg  324 mg Oral Every Other Day Hallac, Alexander, MD   324 mg at 09/25/23 0912  folic acid (FOLVITE) tablet 1 mg  1 mg Oral Daily Hallac, Alexander, MD   1 mg at 09/25/23 0912  gabapentin (NEURONTIN) capsule 400 mg  400 mg Oral BID WC Hallac, Alexander, MD   400 mg at 09/25/23 0912  povidone-iodine (PROFEND) 10 % nasal swab 1 Application  1 Application Nasal Q12H Cho, Eunna, DO   1 Application at 09/25/23 0912  rosuvastatin (CRESTOR) tablet 10 mg  10 mg Oral Nightly Hallac, Alexander, MD   10 mg at 09/24/23 2012  sodium chloride 0.9 % flush 10 mL  10 mL Intra-Catheter Q12H Cho, Eunna, DO   10 mL at 09/24/23 1019  sodium chloride 0.9 % flush 3 mL  3 mL IV Push Q8H Hallac, Alexander, MD   3 mL at 09/25/23 0604   sodium chloride 10 mL/hr (09/25/23 0600)  sodium chloride 100 mL/hr (09/25/23 0918) acetaminophen, oxyCODONE, sodium chloride, sodium chloride, sodium chlorideCurrent Facility-Administered Medications Medication Dose Route Frequency Last Rate  acetaminophen  650 mg Oral Q8H PRN    apixaban  5 mg Oral Q12H    chlorhexidine gluconate   Topical (Top) Daily    ertapenem  1,000 mg Intravenous Q24H 1,000 mg (09/25/23 0931)  famotidine  20 mg Oral Daily    ferrous gluconate  324 mg Oral Every Other Day    folic acid  1 mg Oral Daily    gabapentin  400 mg Oral BID WC    oxyCODONE  5 mg Oral Q6H PRN    povidone-iodine  1 Application Nasal Q12H    rosuvastatin  10 mg Oral Nightly    sodium chloride  10 mL Intra-Catheter Q12H    sodium chloride  10 mL Intra-Catheter PRN for Line Care    sodium chloride  20 mL Intra-Catheter PRN for Line Care    sodium chloride  3 mL IV Push Q8H    sodium chloride  3 mL IV Push PRN for Line Care    sodium chloride  10 mL/hr Intravenous Continuous 10 mL/hr (09/25/23 0600)  sodium chloride  100 mL/hr Intravenous Continuous 100 mL/hr (09/25/23 0918) Review of Systems: Review of Systems Constitutional:  Positive for fever. Negative for chills, diaphoresis, fatigue and unexpected weight change. HENT:  Negative for congestion, dental problem, drooling, ear discharge, ear pain, facial swelling, hearing loss, mouth sores, postnasal drip, rhinorrhea, sinus pressure, sinus pain, sneezing, sore throat and trouble swallowing.  Eyes:  Negative for photophobia, pain, discharge, redness, itching and visual disturbance. Respiratory:  Negative for cough, chest tightness, shortness of breath and wheezing.  Cardiovascular:  Negative for chest pain, palpitations and leg swelling. Gastrointestinal:  Negative for abdominal distention, abdominal pain, blood in stool, constipation, diarrhea, nausea, rectal pain and vomiting. Endocrine: Negative for cold intolerance and heat intolerance. Genitourinary:  Negative for decreased urine volume, difficulty urinating, dysuria, flank pain, frequency, genital sores, hematuria, pelvic pain, urgency and vaginal discharge. Musculoskeletal:  Negative for arthralgias, back pain, gait problem, joint swelling, myalgias, neck pain and neck stiffness. Skin:  Negative for color change, pallor, rash and wound. Allergic/Immunologic: Positive for immunocompromised state. Neurological:  Negative for dizziness, seizures, syncope, facial asymmetry, weakness, light-headedness, numbness and headaches. Hematological:  Negative for adenopathy. Psychiatric/Behavioral:  Negative for agitation, behavioral problems, confusion, self-injury and suicidal  ideas.  Physical Exam: Vital Signs:Blood pressure (!) 94/57, pulse 88, temperature 97.1 ?F (36.2 ?C), temperature source Oral, resp. rate 19, height 5' 5 (1.651 m), weight 57.9 kg, SpO2 99 %.Intake/Output:I have reviewed the patient's current I&O's as documented in the EMR. and Gross Totals (Last 24 hours) at 09/25/2023 1758Last data filed at 09/25/2023 0600Intake 5286.72 ml Output 150 ml Net 5136.72 ml Physical ExamConstitutional:     General: She is not in acute distress.   Appearance: She is ill-appearing. She is not toxic-appearing or diaphoretic. HENT:    Head: Normocephalic and atraumatic.    Nose: No congestion or rhinorrhea.    Mouth/Throat:    Mouth: Mucous membranes are dry.    Pharynx: Oropharynx is clear. No oropharyngeal exudate or posterior oropharyngeal erythema.    Comments: Poor dentition.  Missing multiple lower teethEyes:    General: No scleral icterus.      Right eye: No discharge.       Left eye: No discharge.    Conjunctiva/sclera: Conjunctivae normal.    Pupils: Pupils are equal, round, and reactive to light. Cardiovascular:    Rate and Rhythm: Normal rate and regular rhythm.    Heart sounds: No murmur heard.   No friction rub. No gallop. Pulmonary:    Effort: No respiratory distress.    Breath sounds: No wheezing, rhonchi or rales.    Comments: Decreased breath sounds at basesAbdominal:    General: There is no distension.    Palpations: Abdomen is soft.    Tenderness: There is no abdominal tenderness. There is no right CVA tenderness, left CVA tenderness, guarding or rebound.    Comments: Ileostomy in place  Genitourinary:   Comments: External urinary catheter in placeMusculoskeletal:       General: No swelling or tenderness.    Cervical back: Neck supple. No rigidity or tenderness.    Right lower leg: No edema.    Left lower leg: No edema. Lymphadenopathy:    Cervical: No cervical adenopathy. Skin:   General: Skin is warm and dry.    Coloration: Skin is not jaundiced or pale.    Findings: No bruising, erythema, lesion or rash.    Comments: RUE midline without signs of infection Neurological:    General: No focal deficit present.    Mental Status: She is alert and oriented to person, place, and time.    Cranial Nerves: No cranial nerve deficit.    Sensory: No sensory deficit.    Motor: No weakness. Psychiatric:       Mood and Affect: Mood normal. Review of Labs/Diagnostics: Lab Review:I have reviewed the patient's labs within the last 24 hrs.Last 24 hours: Recent Results (from the past 24 hour(s)) CBC auto differential  Collection Time: 09/25/23  9:05 AM Result Value Ref Range  WBC 44.3 (H) 4.0 - 11.0 x1000/?L  RBC 3.23 (L) 4.00 - 6.00 M/?L  Hemoglobin 9.2 (L) 11.7 - 15.5 g/dL  Hematocrit 13.08 (L) 65.78 - 45.00 %  MCV 89.2 80.0 - 100.0 fL  MCH 28.5 27.0 - 33.0 pg  MCHC 31.9 31.0 - 36.0 g/dL  RDW-CV 46.9 (H) 62.9 - 15.0 %  Platelets 331 150 - 420 x1000/?L  MPV 9.5 8.0 - 12.0 fL  nRBC 0.0 0.0 - 1.0 %  Absolute nRBC 0.00 0.00 - 1.00 x 1000/?L Comprehensive metabolic panel  Collection Time: 09/25/23  9:05 AM Result Value Ref Range  Sodium 145 136 - 145 mmol/L  Potassium 3.3 (L) 3.5 - 5.1 mmol/L  Chloride 120 (H)  95 - 115 mmol/L  CO2 20 (L) 21 - 32 mmol/L  Anion Gap 5 5 - 18  Glucose 98 70 - 100 mg/dL  BUN 14 8 - 25 mg/dL  Creatinine 7.84 6.96 - 1.30 mg/dL  Calcium 9.0 8.4 - 29.5 mg/dL  BUN/Creatinine Ratio 28.4 8.0 - 25.0  Total Protein 6.8 6.4 - 8.2 g/dL  Albumin 2.2 (L) 3.4 - 5.0 g/dL  Total Bilirubin 0.2 0.0 - 1.0 mg/dL  Alkaline Phosphatase 61 20 - 120 U/L  Alanine Aminotransferase (ALT) 11 (L) 12 - 78 U/L  Aspartate Aminotransferase (AST) 30 5 - 37 U/L  Globulin 4.6 g/dL  A/G Ratio 0.5   AST/ALT Ratio 2.7 Reference Range Not Established  Osmolality Calculation 289 275 - 295 mOsm/kg  eGFR (Creatinine) 54 (L) >=60 mL/min/1.39m2 Manual Differential  Collection Time: 09/25/23  9:05 AM Result Value Ref Range  Neutrophils 70.6 39.0 - 72.0 %  Bands 19.3 (H) 0.0 - 10.0 %  Lymphocytes 0.8 (L) 17.0 - 50.0 %  Monocytes 2.5 (L) 4.0 - 12.0 %  Eosinophils 0.0 0.0 - 5.0 %  Basophil 0.9 0.0 - 1.4 %  Metamyelocytes 5.1 (H) 0.0 - 1.0 %  Atypical Lymphocytes 0.8 0.0 - 1.0 %  Neutrophils Absolute 39.80 (H) 2.00 - 7.60 x 1000/?L  Lymphocyte Absolute 0.35 (L) 0.60 - 3.70 x 1000/?L  Monocyte Absolute Count 1.11 (H) 0.00 - 1.00 x 1000/?L  Eosinophil Absolute Count 0.00 0.00 - 1.00 x 1000/?L  Basophil Absolute Count 0.40 0.00 - 1.00 x 1000/?L  RBC Morphology Reviewed   Ovalocytes 1+ (A) None C-reactive protein (CRP)  Collection Time: 09/25/23  9:05 AM Result Value Ref Range  C-Reactive Protein 12.6 (H) 0.0 - 1.0 mg/dL Procalcitonin     (BH GH LMW Q YH)  Collection Time: 09/25/23  9:05 AM Result Value Ref Range  Procalcitonin 161.27 (H) See Comment ng/mL CBC auto differential  Collection Time: 09/25/23 12:05 PM Result Value Ref Range  WBC 46.6 (H) 4.0 - 11.0 x1000/?L  RBC 3.16 (L) 4.00 - 6.00 M/?L  Hemoglobin 8.8 (L) 11.7 - 15.5 g/dL  Hematocrit 13.24 (L) 40.10 - 45.00 %  MCV 91.1 80.0 - 100.0 fL  MCH 27.8 27.0 - 33.0 pg  MCHC 30.6 (L) 31.0 - 36.0 g/dL  RDW-CV 27.2 (H) 53.6 - 15.0 %  Platelets 328 150 - 420 x1000/?L  MPV 9.5 8.0 - 12.0 fL  nRBC 0.0 0.0 - 1.0 %  Absolute nRBC 0.00 0.00 - 1.00 x 1000/?L Manual Differential  Collection Time: 09/25/23 12:05 PM Result Value Ref Range  Neutrophils 83.4 (H) 39.0 - 72.0 %  Bands 7.5 0.0 - 10.0 %  Lymphocytes 5.8 (L) 17.0 - 50.0 %  Monocytes 3.3 (L) 4.0 - 12.0 %  Eosinophils 0.0 0.0 - 5.0 %  Basophil 0.0 0.0 - 1.4 %  Neutrophils Absolute 42.40 (H) 2.00 - 7.60 x 1000/?L  Lymphocyte Absolute 2.71 0.60 - 3.70 x 1000/?L  Monocyte Absolute Count 1.54 (H) 0.00 - 1.00 x 1000/?L  Eosinophil Absolute Count 0.00 0.00 - 1.00 x 1000/?L  Basophil Absolute Count 0.00 0.00 - 1.00 x 1000/?L  RBC Morphology Reviewed   Burr Cells 1+ (A) None  Ovalocytes 1+ (A) None  Giant PLTs Present None Sedimentation rate (ESR)  Collection Time: 09/25/23 12:05 PM Result Value Ref Range  Sedimentation Rate (ESR) 60 (H) 0 - 20 mm/hr Lactic acid, plasma (reflex 2h repeat)  Collection Time: 09/25/23  1:13 PM Result Value Ref Range  Lactate 2.9 (H)  0.4 - 2.0 mmol/L CBC auto differential  Collection Time: 09/25/23  3:37 PM Result Value Ref Range  WBC 40.4 (H) 4.0 - 11.0 x1000/?L  RBC 3.13 (L) 4.00 - 6.00 M/?L  Hemoglobin 8.8 (L) 11.7 - 15.5 g/dL  Hematocrit 95.63 (L) 87.56 - 45.00 %  MCV 89.8 80.0 - 100.0 fL  MCH 28.1 27.0 - 33.0 pg  MCHC 31.3 31.0 - 36.0 g/dL  RDW-CV 43.3 (H) 29.5 - 15.0 %  Platelets 311 150 - 420 x1000/?L  MPV 9.4 8.0 - 12.0 fL  nRBC 0.0 0.0 - 1.0 %  Absolute nRBC 0.00 0.00 - 1.00 x 1000/?L Manual Differential  Collection Time: 09/25/23  3:37 PM Result Value Ref Range  Neutrophils 86.0 (H) 39.0 - 72.0 %  Bands 9.9 0.0 - 10.0 %  Lymphocytes 4.1 (L) 17.0 - 50.0 %  Monocytes 0.0 (L) 4.0 - 12.0 %  Eosinophils 0.0 0.0 - 5.0 %  Basophil 0.0 0.0 - 1.4 %  Neutrophils Absolute 38.72 (H) 2.00 - 7.60 x 1000/?L  Lymphocyte Absolute 1.66 0.60 - 3.70 x 1000/?L  Monocyte Absolute Count 0.00 0.00 - 1.00 x 1000/?L  Eosinophil Absolute Count 0.00 0.00 - 1.00 x 1000/?L  Basophil Absolute Count 0.00 0.00 - 1.00 x 1000/?L  RBC Morphology Reviewed   Hypochromia 1+ (A) None  Ovalocytes 1+ (A) None  Giant PLTs Present None Lactic acid, plasma (reflex 2h repeat)  Collection Time: 09/25/23  3:45 PM Result Value Ref Range  Lactate 2.0 0.4 - 2.0 mmol/L Comprehensive metabolic panel  Collection Time: 09/25/23  3:45 PM Result Value Ref Range  Sodium 145 136 - 145 mmol/L  Potassium 3.9 3.5 - 5.1 mmol/L  Chloride 119 (H) 95 - 115 mmol/L  CO2 18 (L) 21 - 32 mmol/L  Anion Gap 8 5 - 18  Glucose 85 70 - 100 mg/dL  BUN 15 8 - 25 mg/dL  Creatinine 1.88 4.16 - 1.30 mg/dL  Calcium 8.8 8.4 - 60.6 mg/dL  BUN/Creatinine Ratio 30.1 8.0 - 25.0  Total Protein 6.8 6.4 - 8.2 g/dL  Albumin 2.1 (L) 3.4 - 5.0 g/dL  Total Bilirubin 0.3 0.0 - 1.0 mg/dL  Alkaline Phosphatase 61 20 - 120 U/L  Alanine Aminotransferase (ALT) 11 (L) 12 - 78 U/L  Aspartate Aminotransferase (AST) 35 5 - 37 U/L Globulin 4.7 g/dL  A/G Ratio 0.4   AST/ALT Ratio 3.2 Reference Range Not Established  Osmolality Calculation 289 275 - 295 mOsm/kg  eGFR (Creatinine) >60 >=60 mL/min/1.63m2 Diagnostic Review:I have reviewed the patient's Radiology report(s) within the last 48 hrs. Significant findings are :. [place] C/A/P (9/23):FINDINGS:Devices/Tubes/Lines: None. Lungs: The central airways are patent. Mild bibasilar atelectasis. No pulmonary nodules or consolidation. Pleura: No pleural effusions or pneumothoraces. Mediastinum: No thyroid nodules. Normal heart size. No pericardial effusion. No aortic aneurysm. Lymph Nodes: No supraclavicular, axillary, mediastinal, or hilar lymphadenopathy. Liver: No focal lesions. Biliary: No biliary ductal dilatation. Dependent calcified gallstones without gallbladder wall thickening or pericholecystic fluid. Spleen: No splenomegaly or focal lesions. Pancreas: No masses or ductal dilatation. Adrenal Glands: No nodules. Kidneys/Ureters: Moderate left hydronephrosis, increased compared to prior exam, with urothelial thickening and enhancement, as well as delayed nephrogram. Nephroureteral stent in stable position. Mild perinephric fat stranding. Unchanged bilateral nonobstructing renal stones. Bowel: Status post right lower quadrant ileostomy. Colonic diverticulosis without evidence of diverticulitis. Peritoneum/Retroperitoneum: No masses, free air, or fluid. Lymph Nodes: No lymphadenopathy. Pelvic Organs/Bladder: Unchanged calcified uterine fibroids. No adnexal masses. Persistent bladder wall thickening, likely exaggerated by under distention.  Vessels: No abdominal aortic aneurysm. Bones/Soft Tissues: No destructive osseous lesions. Lipoma noted in the left upper back subcutaneous tissues. IMPRESSION:1. Moderate left hydroureteronephrosis, increased compared to prior exam, with nephroureteral stent again in place. Urothelial thickening/enhancement and perinephric fat stranding may represent ascending infection.2. Stable bilateral nonobstructing renal stones. 3. No acute findings in the chest.Renal US (10/2):The right kidney measures 10.2 cm in length. Corticomedullary differentiation is intact. There is a 4 mm calculus in the upper pole. There is no pelvocaliectasis, cortical thinning or scarring. The left kidney measures 10.7 cm in length. Corticomedullary differentiation is intact. There are no focal lesions or calculi. There is mild renal pelvic fullness. There is an anechoic 1.1 cm cyst. The bladder is normally distended. Bilateral ureteral jets are seen. Post void residual volume is normal.  Impression: 4 mm nonobstructing right renal calculus. Trace left renal pelvic [place] A/P:Brush ABDOMEN:  Atelectasis is seen at both lung bases, right greater than left, with trace bilateral pleural effusions. A left ureteral stent is in place. The pigtail catheter has slightly retracted and now projects within the renal pelvis/proximal left ureter with interval increase in moderate left-sided hydronephrosis. There has been interval increase in extensive left perinephric stranding with fluid extending inferiorly within the perirenal space without focal abscess. An 8 mm low-attenuation lesion in the upper pole of the left kidney is unchanged. There are scattered 2 to 3 mm calculi within the left renal collecting system. No definite ureteral calculus is seen along the course of the stent. A nonobstructing 6 mm right upper pole calculus is again identified without right-sided hydronephrosis. Multiple gallstones are noted with a small amount of surrounding fluid likely due to a right-sided ascites. The liver, spleen, and pancreas are unremarkable. There is bilateral adrenal thickening. The aorta is nondilated. There is stable mild retroperitoneal adenopathy. The SMA and SMV are patent. Diffuse colonic diverticula are noted without [place] evidence for diverticulitis. 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 There are multiple sigmoid diverticula without [place] evidence for diverticulitis. A large ball of formed stool is seen in the rectum. There is air in the bladder likely due to instrumentation. No pelvic adenopathy is seen. There are degenerative changes of the spine and scoliosis without compression fracture. IMPRESSION:  1. Slight interval retraction of a left ureteral stent with the proximal pigtail now within the renal pelvis/proximal left ureter with increase in moderate left-sided hydronephrosis and left perinephric fluid. No focal perinephric abscess is identified.2. Constipation with a large ball of formed stool in the rectum without diverticulitis or bowel obstruction.3. Increased bibasilar atelectasis.4. Bilateral renal calculi.fullness. Impression: 67 yo F w/ PMH of rheumatoid arthritis on HCQ, DVT on apixaban, perforated diverticulitis s/p right hemicolectomy/ileostomy (2023), IDA, peripheral neuropathy, cognitive impairment, and ESBL urosepsis 2/2 obstructing stone s/p ureteral stents (06/2023) who was admitted on 09/23 with sepsis.  Please see my consult note from 10/04 for details regarding presentation and infectious history.  Found to have ESBL E coli bacteremia from urine source.  Clinically responded to ertapenem. Dr. Hulen Luster performed cystoscopy with left ureteral stent replacement and removal of stone.  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  Markedly elevated WBC and PCT of the next day. [place] scan on 10/04 showed slight retraction of left ureteral stent with interval increase in moderate left-sided hydronephrosis and left perinephric fluid, no focal perinephric abscess, and  bilateral renal stones.#Leukocytosis.  Markedly elevated PCT and WBC today.  Remains afebrile and bandemia resolved.  No localizing symptoms to indicate another source.  No evidence of an uncontrolled infection prior to urologic procedure and was on appropriate antibiotics.  Difficult ureteroscopy due to impacted stone and stricture.  Stone was likely colonized with ESBL E coli which could explain her recurrent infection.  Most likely transiently seeded during stone removal.  Would continue current regimen for now.  Renal abscess is always a concern; left perinephric fluid, but no focal abscess seen on imaging.  Ureteral injury is a possibility as well given difficult procedure.  Does have history of rheumatoid arthritis and has been off Plaquenil since admission, but clinical exam unremarkable for a RA flare.#Elevated inflammatory markers#ESBL E coli bacteremia #Complicated UTIRecommendations: -follow up blood and urine cultures-trend inflammatory markers/cell counts and monitor fever curve-continue with ertapenem 1gm IV daily -final duration dependent on clinical course-would broaden regimen to Avycaz or Vabomere if clinically decompensates.  I am on-call this evening, so can contact me if this occurs Discussed with Dr. Theodoro Grist and Dr. Arcola Jansky, M.D.Infectious Diseases Pager: [phone]: [phone]: [phone]

## 2023-09-25 NOTE — Plan of Care
 Plan of Care Overview/ Patient Status    Assumed care of pt at 19:30 hrs who is alert and oriented x 4 w/no complaints of pain.L sided facial droop. The pt is assist x 2, bedrest at baseline. Pt had L sided stents in her kidney placed for kidney stones. She has a Purewick. 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 Pt a midline on her right arm. NS running KVO and LR running @ 75 mL/Hr. Pt is on 2 L NC. VSS, Contact precautions for + ESBL in urine. Pt is on RTM. Safety precautions in place. Bed low, wheels locked, call bell within reach. 3:30 am-Pt had urine soaked pad. Changed. Purewick replaced

## 2023-09-26 LAB — COMPREHENSIVE METABOLIC PANEL
BKR A/G RATIO: 0.5
BKR A/G RATIO: 0.5
BKR ALANINE AMINOTRANSFERASE (ALT): 13 U/L (ref 12–78)
BKR ALANINE AMINOTRANSFERASE (ALT): 14 U/L (ref 12–78)
BKR ALBUMIN: 2 g/dL — ABNORMAL LOW (ref 3.4–5.0)
BKR ALBUMIN: 2 g/dL — ABNORMAL LOW (ref 3.4–5.0)
BKR ALKALINE PHOSPHATASE: 63 U/L (ref 20–120)
BKR ALKALINE PHOSPHATASE: 70 U/L (ref 20–120)
BKR ANION GAP: 5 (ref 5–18)
BKR ANION GAP: 5 (ref 5–18)
BKR ASPARTATE AMINOTRANSFERASE (AST): 39 U/L — ABNORMAL HIGH (ref 5–37)
BKR ASPARTATE AMINOTRANSFERASE (AST): 35 U/L (ref 5–37)
BKR AST/ALT RATIO: 3
BKR AST/ALT RATIO: 2.5
BKR BILIRUBIN TOTAL: 0.2 mg/dL (ref 0.0–1.0)
BKR BILIRUBIN TOTAL: 0.2 mg/dL (ref 0.0–1.0)
BKR BLOOD UREA NITROGEN: 14 mg/dL (ref 8–25)
BKR BLOOD UREA NITROGEN: 14 mg/dL (ref 8–25)
BKR BUN / CREAT RATIO: 14.9 (ref 8.0–25.0)
BKR BUN / CREAT RATIO: 17.3 (ref 8.0–25.0)
BKR CALCIUM: 8.8 mg/dL (ref 8.4–10.3)
BKR CALCIUM: 8.9 mg/dL (ref 8.4–10.3)
BKR CHLORIDE: 121 mmol/L — ABNORMAL HIGH (ref 95–115)
BKR CHLORIDE: 123 mmol/L — ABNORMAL HIGH (ref 95–115)
BKR CO2: 19 mmol/L — ABNORMAL LOW (ref 21–32)
BKR CO2: 19 mmol/L — ABNORMAL LOW (ref 21–32)
BKR CREATININE: 0.94 mg/dL (ref 0.50–1.30)
BKR CREATININE: 0.81 mg/dL (ref 0.50–1.30)
BKR EGFR, CREATININE (CKD-EPI 2021): >60 mL/min/{1.73_m2} (ref >=60)
BKR EGFR, CREATININE (CKD-EPI 2021): >60 mL/min/{1.73_m2} (ref >=60)
BKR GLOBULIN: 4.4 g/dL
BKR GLOBULIN: 4.1 g/dL
BKR GLUCOSE: 90 mg/dL (ref 70–100)
BKR GLUCOSE: 84 mg/dL (ref 70–100)
BKR OSMOLALITY CALCULATION: 289 mosm/kg (ref 275–295)
BKR OSMOLALITY CALCULATION: 292 mosm/kg (ref 275–295)
BKR POTASSIUM: 3.4 mmol/L — ABNORMAL LOW (ref 3.5–5.1)
BKR POTASSIUM: 3.7 mmol/L (ref 3.5–5.1)
BKR PROTEIN TOTAL: 6.4 g/dL (ref 6.4–8.2)
BKR PROTEIN TOTAL: 6.1 g/dL — ABNORMAL LOW (ref 6.4–8.2)
BKR SODIUM: 145 mmol/L (ref 136–145)
BKR SODIUM: 147 mmol/L — ABNORMAL HIGH (ref 136–145)

## 2023-09-26 LAB — MANUAL DIFFERENTIAL
BKR WAM BANDS (DIFF) (1 DEC): 13.3 % — ABNORMAL HIGH (ref 0.0–10.0)
BKR WAM BASOPHIL - ABS (DIFF) 2 DEC: 0 x 1000/ÂµL (ref 0.00–1.00)
BKR WAM BASOPHILS (DIFF): 0 % (ref 0.0–1.4)
BKR WAM EOSINOPHILS (DIFF) 2 DEC: 0 x 1000/ÂµL (ref 0.00–1.00)
BKR WAM EOSINOPHILS (DIFF): 0 % (ref 0.0–5.0)
BKR WAM LYMPHOCYTE - ABS (DIFF) 2 DEC: 1.07 x 1000/ÂµL (ref 0.60–3.70)
BKR WAM LYMPHOCYTES (DIFF): 2.7 % — ABNORMAL LOW (ref 17.0–50.0)
BKR WAM MONOCYTE - ABS (DIFF) 2 DEC: 1.75 x 1000/ÂµL — ABNORMAL HIGH (ref 0.00–1.00)
BKR WAM MONOCYTES (DIFF): 4.4 % (ref 4.0–12.0)
BKR WAM NEUTROPHILS (DIFF): 79.6 % — ABNORMAL HIGH (ref 39.0–72.0)
BKR WAM NEUTROPHILS - ABS (DIFF) 2 DEC: 36.88 x 1000/ÂµL — ABNORMAL HIGH (ref 2.00–7.60)

## 2023-09-26 LAB — CBC WITH AUTO DIFFERENTIAL
BKR WAM ABSOLUTE IMMATURE GRANULOCYTES.: 1.69 x 1000/ÂµL — ABNORMAL HIGH (ref 0.00–0.30)
BKR WAM ABSOLUTE LYMPHOCYTE COUNT.: 1.33 x 1000/ÂµL (ref 0.60–3.70)
BKR WAM ABSOLUTE NRBC (2 DEC): 0 x 1000/ÂµL (ref 0.00–1.00)
BKR WAM ABSOLUTE NRBC (2 DEC): 0 x 1000/ÂµL (ref 0.00–1.00)
BKR WAM ANC (ABSOLUTE NEUTROPHIL COUNT): 32.37 x 1000/ÂµL — ABNORMAL HIGH (ref 2.00–7.60)
BKR WAM BASOPHIL ABSOLUTE COUNT.: 0.11 x 1000/ÂµL (ref 0.00–1.00)
BKR WAM BASOPHILS: 0.3 % (ref 0.0–1.4)
BKR WAM EOSINOPHIL ABSOLUTE COUNT.: 0.12 x 1000/ÂµL (ref 0.00–1.00)
BKR WAM EOSINOPHILS: 0.3 % (ref 0.0–5.0)
BKR WAM HEMATOCRIT (2 DEC): 27.1 % — ABNORMAL LOW (ref 35.00–45.00)
BKR WAM HEMATOCRIT (2 DEC): 28 % — ABNORMAL LOW (ref 35.00–45.00)
BKR WAM HEMOGLOBIN: 8.5 g/dL — ABNORMAL LOW (ref 11.7–15.5)
BKR WAM HEMOGLOBIN: 8.7 g/dL — ABNORMAL LOW (ref 11.7–15.5)
BKR WAM IMMATURE GRANULOCYTES: 4.6 % — ABNORMAL HIGH (ref 0.0–1.0)
BKR WAM LYMPHOCYTES: 3.6 % — ABNORMAL LOW (ref 17.0–50.0)
BKR WAM MCH (PG): 28 pg (ref 27.0–33.0)
BKR WAM MCH (PG): 28.1 pg (ref 27.0–33.0)
BKR WAM MCHC: 31.4 g/dL (ref 31.0–36.0)
BKR WAM MCHC: 31.1 g/dL (ref 31.0–36.0)
BKR WAM MCV: 89.1 fL (ref 80.0–100.0)
BKR WAM MCV: 90.3 fL (ref 80.0–100.0)
BKR WAM MONOCYTE ABSOLUTE COUNT.: 1.42 x 1000/ÂµL — ABNORMAL HIGH (ref 0.00–1.00)
BKR WAM MONOCYTES: 3.8 % — ABNORMAL LOW (ref 4.0–12.0)
BKR WAM MPV: 9.5 fL (ref 8.0–12.0)
BKR WAM MPV: 9.6 fL (ref 8.0–12.0)
BKR WAM NEUTROPHILS: 87.4 % — ABNORMAL HIGH (ref 39.0–72.0)
BKR WAM NUCLEATED RED BLOOD CELLS: 0 % (ref 0.0–1.0)
BKR WAM NUCLEATED RED BLOOD CELLS: 0 % (ref 0.0–1.0)
BKR WAM PLATELETS: 330 x1000/ÂµL (ref 150–420)
BKR WAM PLATELETS: 297 x1000/ÂµL (ref 150–420)
BKR WAM RDW-CV: 17.1 % — ABNORMAL HIGH (ref 11.0–15.0)
BKR WAM RDW-CV: 17.4 % — ABNORMAL HIGH (ref 11.0–15.0)
BKR WAM RED BLOOD CELL COUNT.: 3.04 M/ÂµL — ABNORMAL LOW (ref 4.00–6.00)
BKR WAM RED BLOOD CELL COUNT.: 3.1 M/ÂµL — ABNORMAL LOW (ref 4.00–6.00)
BKR WAM WHITE BLOOD CELL COUNT: 39.7 x1000/ÂµL — ABNORMAL HIGH (ref 4.0–11.0)
BKR WAM WHITE BLOOD CELL COUNT: 37 x1000/ÂµL — ABNORMAL HIGH (ref 4.0–11.0)

## 2023-09-26 LAB — PROCALCITONIN     (BH GH LMW Q YH)
BKR PROCALCITONIN: 143.15 ng/mL — ABNORMAL HIGH
BKR PROCALCITONIN: 113.78 ng/mL — ABNORMAL HIGH

## 2023-09-26 LAB — LACTIC ACID, PLASMA: BKR LACTATE: 1.3 mmol/L (ref 0.4–2.0)

## 2023-09-26 LAB — C-REACTIVE PROTEIN     (CRP)
BKR C-REACTIVE PROTEIN: 15.5 mg/dL — ABNORMAL HIGH (ref 0.0–1.0)
BKR C-REACTIVE PROTEIN: 14.7 mg/dL — ABNORMAL HIGH (ref 0.0–1.0)

## 2023-09-26 LAB — BLOOD CULTURE   (BH GH L LMW YH): BKR BLOOD CULTURE: NO GROWTH

## 2023-09-26 LAB — PHOSPHORUS     (BH GH L LMW YH): BKR PHOSPHORUS: 1.8 mg/dL — ABNORMAL LOW (ref 2.5–4.9)

## 2023-09-26 LAB — C. DIFFICILE ASSAY: BKR C. DIFFICILE TOXIN B DNA PCR: NEGATIVE

## 2023-09-26 LAB — MAGNESIUM: BKR MAGNESIUM: 1.4 mg/dL (ref 1.4–2.2)

## 2023-09-26 LAB — REFLEX LACTIC ACID, PLASMA: BKR LACTATE: 2.1 mmol/L — ABNORMAL HIGH (ref 0.4–2.0)

## 2023-09-26 LAB — LACTIC ACID, PLASMA (REFLEX 2H REPEAT): BKR LACTATE: 3.3 mmol/L — ABNORMAL HIGH (ref 0.4–2.0)

## 2023-09-26 LAB — URINE CULTURE: BKR URINE CULTURE, ROUTINE: NO GROWTH

## 2023-09-26 NOTE — Progress Notes
 [facility]	Infectious Disease Progress NoteAttending Provider: Lyndee Leo, DO Subjective Subjective: Interim History:  RRT yesterday evening for hypotension.  Responded to fluids and switch to ceftazidime-avibactam.  Transferred to intermediate care.  Feels better this morning.  Says she was extremely weak yesterday evening.  No specific complaints.  Denied hip/leg pain today.  In good spirits.D12 total abx: D1 Avycaz after 11 days of ertapenem and 1-2 doses of Zosyn and vancomycinAfebrile PCT-113.78 (peak-161.27 on 10/04)CRP-15.5ESR-60WBC-37 (peak-46.6 on 10/4).Micro:Urine culture (9/23-clean-catch):  ESBL E coliUrine culture (9/30-straight cath): No growthOR urine culture (10/3-bladder):  No growthBlood culture (9/23):  ESBL E coliBlood culture (9/29):  No growthBlood culture (10/4):  No growth to dateC diff (10/4): NegativeReview of Allergies/Meds/Hx: I have reviewed the patient's: allergies, current scheduled medications, current infusions, current prn medications, past medical history, past surgical history, family history, social history, and prior to admission medicationsScheduled Meds:Current Facility-Administered Medications Medication Dose Route Frequency Provider Last Rate Last Admin  acetaminophen (TYLENOL) tablet 975 mg  975 mg Oral Q6H Ferrel Logan, PA      apixaban (ELIQUIS) tablet 5 mg  5 mg Oral Q12H Cho, Eunna, DO   5 mg at 09/26/23 0844  cefTAZIDime-avibactam (AVYCAZ) 2.5 g in sodium chloride 0.9% 50 mL (mini-bag plus)  2.5 g Intravenous Q8H Benedetto Coons, MD 25 mL/hr at 09/26/23 0600 Rate Verify at 09/26/23 0600  chlorhexidine gluconate (HIBICLENS/BETASEPT) 4 % topical liquid   Topical (Top) Daily Ferrel Logan, [place]   Given at 09/26/23 0849  famotidine (PEPCID) tablet 20 mg  20 mg Oral Daily Ferrel Logan, [place]   20 mg at 09/26/23 0843  ferrous gluconate (FERGON) tablet 324 mg  324 mg Oral Every Other Day Ferrel Logan, PA   324 mg at 09/25/23 0912  folic acid (FOLVITE) tablet 1 mg  1 mg Oral Daily Ferrel Logan, PA   1 mg at 09/26/23 0843  gabapentin (NEURONTIN) capsule 400 mg  400 mg Oral BID WC Ferrel Logan, PA   400 mg at 09/26/23 0843  polyethylene glycol (MIRALAX) packet 17 g  17 g Oral Daily Ferrel Logan, PA   17 g at 09/26/23 0843  povidone-iodine (PROFEND) 10 % nasal swab 1 Application  1 Application Nasal Q12H Ferrel Logan, PA   1 Application at 09/26/23 0849  rosuvastatin (CRESTOR) tablet 10 mg  10 mg Oral Nightly Ferrel Logan, PA   10 mg at 09/25/23 2047  senna (SENOKOT) tablet 8.6 mg  1 tablet Oral Nightly Ferrel Logan, PA      sodium chloride 0.9 % flush 10 mL  10 mL Intra-Catheter Q12H Ferrel Logan, PA   10 mL at 09/26/23 0856  sodium chloride 0.9 % flush 3 mL  3 mL IV Push Q8H Ferrel Logan, PA   3 mL at 09/25/23 2045 Continuous Infusions: lactated ringers 150 mL/hr (09/26/23 0600)  sodium chloride 10 mL/hr (09/25/23 0600) PRN Meds:.acetaminophen, ondansetron **OR** ondansetron (PF), oxyCODONE, oxyCODONE, prochlorperazine **OR** prochlorperazine edisylate, sodium chloride, sodium chloride, sodium chloride Objective Objective: Vitals:I have reviewed the patient's current vital signs as documented in the patient's EMR.  , Last 24 hours: Temp:  [97.1 ?F (36.2 ?C)-98.9 ?F (37.2 ?C)] 98.4 ?F (36.9 ?C)Pulse:  [65-84] 75Resp:  [16-20] 20BP: (75-101)/(46-65) 101/56SpO2:  [94 %-100 %] 95 %, and O2 Sat and Device used: SpO2: 95 %Device (Oxygen Therapy): nasal cannulaI/O's:I have reviewed the patient's current I&O's as documented in the EMR.Gross Totals (Last 24 hours) at 09/26/2023 1137Last data filed at 09/26/2023 0800Intake 4799.2 ml Output 600 ml Net 4199.2 ml  Physical ExamConstitutional:     General: She is not in acute distress.   Appearance: She is ill-appearing. She is not toxic-appearing or diaphoretic. HENT:    Head: Normocephalic and atraumatic.    Nose: No congestion or rhinorrhea.    Mouth/Throat:    Mouth: Mucous membranes are dry.    Pharynx: Oropharynx is clear. No oropharyngeal exudate or posterior oropharyngeal erythema.    Comments: Poor dentition.  Missing multiple lower teethEyes:    General: No scleral icterus.      Right eye: No discharge.       Left eye: No discharge.    Conjunctiva/sclera: Conjunctivae normal.    Pupils: Pupils are equal, round, and reactive to light. Cardiovascular:    Rate and Rhythm: Normal rate and regular rhythm.    Heart sounds: No murmur heard.   No friction rub. No gallop. Pulmonary:    Effort: No respiratory distress.    Breath sounds: No wheezing, rhonchi or rales.    Comments: Decreased breath sounds at basesAbdominal:    General: There is no distension.    Palpations: Abdomen is soft.    Tenderness: There is no abdominal tenderness. There is no right CVA tenderness, left CVA tenderness, guarding or rebound.    Comments: Ileostomy in place  Genitourinary:   Comments: External urinary catheter in placeMusculoskeletal:       General: No swelling or tenderness.    Cervical back: Neck supple. No rigidity or tenderness.    Right lower leg: No edema.    Left lower leg: No edema. Lymphadenopathy:    Cervical: No cervical adenopathy. Skin:   General: Skin is warm and dry.    Coloration: Skin is not jaundiced or pale.    Findings: No bruising, erythema, lesion or rash.    Comments: RUE midline without signs of infection Neurological:    General: No focal deficit present.    Mental Status: She is alert and oriented to person, place, and time.    Cranial Nerves: No cranial nerve deficit.    Sensory: No sensory deficit.    Motor: No weakness. Psychiatric:       Mood and Affect: Mood normal. Labs:I have reviewed the patient's labs within the last 24 hrs.Last 24 hours: Recent Results (from the past 24 hour(s)) CBC auto differential  Collection Time: 09/25/23 12:05 PM Result Value Ref Range  WBC 46.6 (H) 4.0 - 11.0 x1000/?L  RBC 3.16 (L) 4.00 - 6.00 M/?L  Hemoglobin 8.8 (L) 11.7 - 15.5 g/dL  Hematocrit 65.78 (L) 46.96 - 45.00 %  MCV 91.1 80.0 - 100.0 fL  MCH 27.8 27.0 - 33.0 pg  MCHC 30.6 (L) 31.0 - 36.0 g/dL  RDW-CV 29.5 (H) 28.4 - 15.0 %  Platelets 328 150 - 420 x1000/?L  MPV 9.5 8.0 - 12.0 fL  nRBC 0.0 0.0 - 1.0 %  Absolute nRBC 0.00 0.00 - 1.00 x 1000/?L Manual Differential  Collection Time: 09/25/23 12:05 PM Result Value Ref Range  Neutrophils 83.4 (H) 39.0 - 72.0 %  Bands 7.5 0.0 - 10.0 %  Lymphocytes 5.8 (L) 17.0 - 50.0 %  Monocytes 3.3 (L) 4.0 - 12.0 %  Eosinophils 0.0 0.0 - 5.0 %  Basophil 0.0 0.0 - 1.4 %  Neutrophils Absolute 42.40 (H) 2.00 - 7.60 x 1000/?L  Lymphocyte Absolute 2.71 0.60 - 3.70 x 1000/?L  Monocyte Absolute Count 1.54 (H) 0.00 - 1.00 x 1000/?L  Eosinophil Absolute Count 0.00 0.00 - 1.00 x 1000/?L  Basophil Absolute Count 0.00 0.00 -  1.00 x 1000/?L  RBC Morphology Reviewed   Ines Bloomer Cells 1+ (A) None  Ovalocytes 1+ (A) None  Giant PLTs Present None Sedimentation rate (ESR)  Collection Time: 09/25/23 12:05 PM Result Value Ref Range  Sedimentation Rate (ESR) 60 (H) 0 - 20 mm/hr Lactic acid, plasma (reflex 2h repeat)  Collection Time: 09/25/23  1:13 PM Result Value Ref Range  Lactate 2.9 (H) 0.4 - 2.0 mmol/L CBC auto differential  Collection Time: 09/25/23  3:37 PM Result Value Ref Range  WBC 40.4 (H) 4.0 - 11.0 x1000/?L  RBC 3.13 (L) 4.00 - 6.00 M/?L  Hemoglobin 8.8 (L) 11.7 - 15.5 g/dL  Hematocrit 16.10 (L) 96.04 - 45.00 %  MCV 89.8 80.0 - 100.0 fL  MCH 28.1 27.0 - 33.0 pg  MCHC 31.3 31.0 - 36.0 g/dL  RDW-CV 54.0 (H) 98.1 - 15.0 %  Platelets 311 150 - 420 x1000/?L  MPV 9.4 8.0 - 12.0 fL  nRBC 0.0 0.0 - 1.0 %  Absolute nRBC 0.00 0.00 - 1.00 x 1000/?L Manual Differential Collection Time: 09/25/23  3:37 PM Result Value Ref Range  Neutrophils 86.0 (H) 39.0 - 72.0 %  Bands 9.9 0.0 - 10.0 %  Lymphocytes 4.1 (L) 17.0 - 50.0 %  Monocytes 0.0 (L) 4.0 - 12.0 %  Eosinophils 0.0 0.0 - 5.0 %  Basophil 0.0 0.0 - 1.4 %  Neutrophils Absolute 38.72 (H) 2.00 - 7.60 x 1000/?L  Lymphocyte Absolute 1.66 0.60 - 3.70 x 1000/?L  Monocyte Absolute Count 0.00 0.00 - 1.00 x 1000/?L  Eosinophil Absolute Count 0.00 0.00 - 1.00 x 1000/?L  Basophil Absolute Count 0.00 0.00 - 1.00 x 1000/?L  RBC Morphology Reviewed   Hypochromia 1+ (A) None  Ovalocytes 1+ (A) None  Giant PLTs Present None Blood culture #1  Collection Time: 09/25/23  3:45 PM  Specimen: Peripheral; Blood Result Value Ref Range  Blood Culture No Growth to Date  Lactic acid, plasma (reflex 2h repeat)  Collection Time: 09/25/23  3:45 PM Result Value Ref Range  Lactate 2.0 0.4 - 2.0 mmol/L Comprehensive metabolic panel  Collection Time: 09/25/23  3:45 PM Result Value Ref Range  Sodium 145 136 - 145 mmol/L  Potassium 3.9 3.5 - 5.1 mmol/L  Chloride 119 (H) 95 - 115 mmol/L  CO2 18 (L) 21 - 32 mmol/L  Anion Gap 8 5 - 18  Glucose 85 70 - 100 mg/dL  BUN 15 8 - 25 mg/dL  Creatinine 1.91 4.78 - 1.30 mg/dL  Calcium 8.8 8.4 - 29.5 mg/dL  BUN/Creatinine Ratio 62.1 8.0 - 25.0  Total Protein 6.8 6.4 - 8.2 g/dL  Albumin 2.1 (L) 3.4 - 5.0 g/dL  Total Bilirubin 0.3 0.0 - 1.0 mg/dL  Alkaline Phosphatase 61 20 - 120 U/L  Alanine Aminotransferase (ALT) 11 (L) 12 - 78 U/L  Aspartate Aminotransferase (AST) 35 5 - 37 U/L  Globulin 4.7 g/dL  A/G Ratio 0.4   AST/ALT Ratio 3.2 Reference Range Not Established  Osmolality Calculation 289 275 - 295 mOsm/kg  eGFR (Creatinine) >60 >=60 mL/min/1.41m2 EKG  Collection Time: 09/25/23  7:57 PM Result Value Ref Range  Heart Rate 79 bpm  QRS Interval 78 ms  QT Interval 384 ms  QTC Interval 440 ms  P Axis 48 deg  QRS Axis 13 deg  T Wave Axis 54 deg  P-R Interval 138 msec  SEVERITY Normal ECG severity Magnesium  Collection Time: 09/25/23  8:01 PM Result Value Ref Range  Magnesium 1.4 1.4 - 2.2 mg/dL Phosphorus  Collection Time: 09/25/23  8:01 PM Result Value Ref Range  Phosphorus 1.8 (L) 2.5 - 4.9 mg/dL Procalcitonin     (BH GH LMW Q YH)  Collection Time: 09/25/23  8:01 PM Result Value Ref Range  Procalcitonin 143.15 (H) See Comment ng/mL C-reactive protein (CRP)  Collection Time: 09/25/23  8:01 PM Result Value Ref Range  C-Reactive Protein 15.5 (H) 0.0 - 1.0 mg/dL CBC auto differential  Collection Time: 09/25/23  8:01 PM Result Value Ref Range  WBC 39.7 (H) 4.0 - 11.0 x1000/?L  RBC 3.04 (L) 4.00 - 6.00 M/?L  Hemoglobin 8.5 (L) 11.7 - 15.5 g/dL  Hematocrit 09.60 (L) 45.40 - 45.00 %  MCV 89.1 80.0 - 100.0 fL  MCH 28.0 27.0 - 33.0 pg  MCHC 31.4 31.0 - 36.0 g/dL  RDW-CV 98.1 (H) 19.1 - 15.0 %  Platelets 330 150 - 420 x1000/?L  MPV 9.5 8.0 - 12.0 fL  nRBC 0.0 0.0 - 1.0 %  Absolute nRBC 0.00 0.00 - 1.00 x 1000/?L Comprehensive metabolic panel  Collection Time: 09/25/23  8:01 PM Result Value Ref Range  Sodium 145 136 - 145 mmol/L  Potassium 3.4 (L) 3.5 - 5.1 mmol/L  Chloride 121 (H) 95 - 115 mmol/L  CO2 19 (L) 21 - 32 mmol/L  Anion Gap 5 5 - 18  Glucose 90 70 - 100 mg/dL  BUN 14 8 - 25 mg/dL  Creatinine 4.78 2.95 - 1.30 mg/dL  Calcium 8.8 8.4 - 62.1 mg/dL  BUN/Creatinine Ratio 30.8 8.0 - 25.0  Total Protein 6.4 6.4 - 8.2 g/dL  Albumin 2.0 (L) 3.4 - 5.0 g/dL  Total Bilirubin 0.2 0.0 - 1.0 mg/dL  Alkaline Phosphatase 63 20 - 120 U/L  Alanine Aminotransferase (ALT) 13 12 - 78 U/L  Aspartate Aminotransferase (AST) 39 (H) 5 - 37 U/L  Globulin 4.4 g/dL  A/G Ratio 0.5   AST/ALT Ratio 3.0 Reference Range Not Established  Osmolality Calculation 289 275 - 295 mOsm/kg  eGFR (Creatinine) >60 >=60 mL/min/1.70m2 Manual Differential Collection Time: 09/25/23  8:01 PM Result Value Ref Range  Neutrophils 79.6 (H) 39.0 - 72.0 %  Bands 13.3 (H) 0.0 - 10.0 %  Lymphocytes 2.7 (L) 17.0 - 50.0 %  Monocytes 4.4 4.0 - 12.0 %  Eosinophils 0.0 0.0 - 5.0 %  Basophil 0.0 0.0 - 1.4 %  Neutrophils Absolute 36.88 (H) 2.00 - 7.60 x 1000/?L  Lymphocyte Absolute 1.07 0.60 - 3.70 x 1000/?L  Monocyte Absolute Count 1.75 (H) 0.00 - 1.00 x 1000/?L  Eosinophil Absolute Count 0.00 0.00 - 1.00 x 1000/?L  Basophil Absolute Count 0.00 0.00 - 1.00 x 1000/?L  RBC Morphology Reviewed   Hypochromia 1+ (A) None  Ovalocytes 1+ (A) None  Tear Drop Cells 1+ (A) None Lactic acid, plasma (reflex 2h repeat)  Collection Time: 09/25/23  8:05 PM Result Value Ref Range  Lactate 3.3 (H) 0.4 - 2.0 mmol/L C. difficile Assay  Collection Time: 09/25/23  8:32 PM  Specimen: Stool Result Value Ref Range  C. difficile Interpretation      Toxigenic C. difficile DNA was NOT detected. See narrative below.  C. difficile Toxin B DNA PCR Negative Negative Reflex lactic acid, plasma  Collection Time: 09/25/23 10:58 PM Result Value Ref Range  Lactate 2.1 (H) 0.4 - 2.0 mmol/L Lactic acid, plasma  Collection Time: 09/26/23  4:30 AM Result Value Ref Range  Lactate 1.3 0.4 - 2.0 mmol/L CBC auto differential  Collection Time: 09/26/23  4:30 AM Result Value Ref Range  WBC 37.0 (H) 4.0 - 11.0 x1000/?L  RBC 3.10 (L) 4.00 - 6.00 M/?L  Hemoglobin 8.7 (L) 11.7 - 15.5 g/dL  Hematocrit 46.96 (L) 29.52 - 45.00 %  MCV 90.3 80.0 - 100.0 fL  MCH 28.1 27.0 - 33.0 pg  MCHC 31.1 31.0 - 36.0 g/dL  RDW-CV 84.1 (H) 32.4 - 15.0 %  Platelets 297 150 - 420 x1000/?L  MPV 9.6 8.0 - 12.0 fL  Neutrophils 87.4 (H) 39.0 - 72.0 %  Lymphocytes 3.6 (L) 17.0 - 50.0 %  Monocytes 3.8 (L) 4.0 - 12.0 %  Eosinophils 0.3 0.0 - 5.0 %  Basophil 0.3 0.0 - 1.4 %  Immature Granulocytes 4.6 (H) 0.0 - 1.0 %  nRBC 0.0 0.0 - 1.0 %  Absolute Lymphocyte Count 1.33 0.60 - 3.70 x 1000/?L  Monocyte Absolute Count 1.42 (H) 0.00 - 1.00 x 1000/?L  Eosinophil Absolute Count 0.12 0.00 - 1.00 x 1000/?L  Basophil Absolute Count 0.11 0.00 - 1.00 x 1000/?L  Absolute Immature Granulocyte Count 1.69 (H) 0.00 - 0.30 x 1000/?L  Absolute nRBC 0.00 0.00 - 1.00 x 1000/?L  ANC (Abs Neutrophil Count) 32.37 (H) 2.00 - 7.60 x 1000/?L Comprehensive metabolic panel  Collection Time: 09/26/23  4:30 AM Result Value Ref Range  Sodium 147 (H) 136 - 145 mmol/L  Potassium 3.7 3.5 - 5.1 mmol/L  Chloride 123 (H) 95 - 115 mmol/L  CO2 19 (L) 21 - 32 mmol/L  Anion Gap 5 5 - 18  Glucose 84 70 - 100 mg/dL  BUN 14 8 - 25 mg/dL  Creatinine 4.01 0.27 - 1.30 mg/dL  Calcium 8.9 8.4 - 25.3 mg/dL  BUN/Creatinine Ratio 66.4 8.0 - 25.0  Total Protein 6.1 (L) 6.4 - 8.2 g/dL  Albumin 2.0 (L) 3.4 - 5.0 g/dL  Total Bilirubin 0.2 0.0 - 1.0 mg/dL  Alkaline Phosphatase 70 20 - 120 U/L  Alanine Aminotransferase (ALT) 14 12 - 78 U/L  Aspartate Aminotransferase (AST) 35 5 - 37 U/L  Globulin 4.1 g/dL  A/G Ratio 0.5   AST/ALT Ratio 2.5 Reference Range Not Established  Osmolality Calculation 292 275 - 295 mOsm/kg  eGFR (Creatinine) >60 >=60 mL/min/1.30m2 Procalcitonin     (BH GH LMW Q YH)  Collection Time: 09/26/23  4:30 AM Result Value Ref Range  Procalcitonin 113.78 (H) See Comment ng/mL C-reactive protein (CRP)  Collection Time: 09/26/23  4:30 AM Result Value Ref Range  C-Reactive Protein 14.7 (H) 0.0 - 1.0 mg/dL Diagnostics:I have reviewed the patient's Radiology report(s) within the last 48 hrs. Significant findings are :.QIH:[id number]: The cardiac and hilar silhouettes are normal. The aorta is tortuous. There is right midlung field atelectasis. There is no pulmonary infiltrate or pulmonary edema.  There is no pleural effusion. There are degenerative changes of the spine and shoulders. IMPRESSION: 1. Right midlung field atelectasis.2. Follow-up PA and lateral radiograph is recommended.ECG/Tele Events: I have reviewed the patient's ECG as resulted in the EMR. Assessment Assessment: 67 yo F w/ PMH of rheumatoid arthritis on HCQ, DVT on apixaban, perforated diverticulitis s/p right hemicolectomy/ileostomy (2023), IDA, peripheral neuropathy, cognitive impairment, and ESBL urosepsis 2/2 obstructing stone s/p ureteral stents (06/2023) who was admitted on 09/23 with sepsis.  Please see my consult note from 10/04 for details regarding presentation and infectious history.  Found to have ESBL E coli bacteremia from urine source.  Clinically responded to ertapenem. Dr. Hulen Luster performed cystoscopy with left ureteral stent replacement and removal of stone.  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  Markedly elevated WBC and PCT of the next day which was reason for ID consultation.  Suspect stone was colonized and likely transiently seeded during stone removal.  [place] scan on 10/04 showed slight retraction of left ureteral stent with interval increase in moderate left-sided hydronephrosis and left perinephric fluid, no focal perinephric abscess, and bilateral renal stones.  No evidence of ureteral injury on [place] scan.  No growth from OR bladder urine culture.  RRT called on the evening of 10/4 for hypotension; responded to IVFs and broadened to ceftazidime-avibactam.  Clinically improved the next day. #Leukocytosis, improving.  Bandemia resolved.  OR urine culture sterile.  No growth from blood cultures thus far.  Most likely transiently seeded during stone removal.  Lower suspicion for XDR pathogen, but agree with broadening coverage given hemodynamic change and prior carbapenem exposure.#Elevated inflammatory markers.  PCT improving#ESBL E coli bacteremia #Complicated UTI Plan Plan: -maintain contact precautions for ESBL pathogen-follow up blood cultures-trend inflammatory markers/cell counts and monitor fever curve-stop oral vancomycin-continue with ceftazidime-avibactam for now.  Can likely narrow coverage tomorrow.-final duration dependent on clinical courseDiscussed with Dr. Annie Main, M.D.Infectious Diseases Pager: [phone]: [phone]: [phone]

## 2023-09-26 NOTE — Other
 Adult RRT EvaluationRRT called at 1942 for hypotension.S - Ms. Hilario is a 67 year old female with a  with a PMHx of rheumatoid arthritis on HCQ, DVT on apixaban, perforated diverticulitis s/p right hemicolectomy/ileostomy (2023), IDA, peripheral neuropathy, cognitive impairment, and ESBL urosepsis 2/2 obstructing stone s/p ureteral stents (06/2023) who was admitted on 09/23 with sepsis. She is s/p urethral stent placement earlier today. She became hypotensive this evening (72/41). Patient is alert, denies pain, shortness of breath, or dysuria.O -Temp:  [97.1 ?F (36.2 ?C)-97.6 ?F (36.4 ?C)] 97.4 ?F (36.3 ?C)Pulse:  [71-94] 84Resp:  [16-19] 18BP: (75-94)/(46-65) 88/48SpO2:  [95 %-100 %] 97 %Device (Oxygen Therapy): room airIn no acute distress.Regular cardiac rate and rhythm. Normal S1, S2;  No murmurs, rubs or gallops. Lungs clear to auscultation bilaterally. No wheezes, rales or rhonchi. Alert and oriented, no facial asymmetry or dysarthria.Ileostomy bag with dark brown liquid stool.Bandage on left abdomen c/d/I, no redness or drainage noted around site.Hypopigmentation in cleft on buttocks, no open wound.Labs - [facility]CR[facility] 10/04/240905 10/04/241205 10/04/241537 WBC 44.3* 46.6* 40.4* HGB 9.2* 8.8* 8.8* HCT 28.80* 28.80* 28.10* PLT 331 328 311 BMPRecent Labs Lab 10/03/241737 10/04/240905 10/04/241545 NA 145 145 145 K 3.7 3.3* 3.9 CL 121* 120* 119* CO2 20* 20* 18* BUN 12 14 15  CREATININE 0.77 1.11 0.90 CALCIUM 9.2 9.0 8.8 LFTsRecent Labs Lab 10/03/240804 10/04/240905 10/04/241545 ALT 10* 11* 11* AST 10 30 35 ALKPHOS 64 61 61 BILITOT 0.1 0.2 0.3 INRRecent Labs Lab 10/03/240804 INR 1.11 LactateRecent Labs Lab 10/01/240225 10/04/241313 10/04/241545 LACTATE 1.2 2.9* 2.0 Glucose MonitoringRecent Labs Lab 10/01/240224 10/02/240749 10/03/240804 10/03/241737 10/04/240905 10/04/241545 GLU 95 86 88 111* 98 85 EKG - Normal sinus rhythmA/P- Patient is a 67 year old female with a PMHx of rheumatoid arthritis on HCQ, DVT on apixaban, perforated diverticulitis s/p right hemicolectomy/ileostomy (2023), IDA, peripheral neuropathy, cognitive impairment, and ESBL urosepsis 2/2 obstructing stone s/p ureteral stents (06/2023) who was admitted on 09/23 with sepsis. She is s/p urethral stent placement earlier today. An RRT was called today at 1942 for hypotension - concern for seeding of infection post-procedure vs. C. diff in the setting of recent antibiotic use. Blood pressure stabalized to 88/65 s/p 1L LR.Labs ordered: Magnesium, phosphorus, CBC, CMP, lactate, CRP, procalcitonin, lactic acidC. diff and stool PCR orderedCXR ordered, pending1L LR bolus givenVancomycin started d/t concern for C. DiffMetronidazole startedErtapenem discontinued, Avycaz started per Dr. Georgeanna Harrison transferred to ICADr. Berkley Harvey notified of the above and directed management. Dr. Andrey Farmer consulted re: antibiotic treatment.Benedetto Coons, MD8:19 PM

## 2023-09-26 NOTE — Plan of Care
 Plan of Care Overview/ Patient Status    Spoke to Dr. Andrey Farmer about Mrs. Danielle [name]'s hypotensive RRT. Discontinued ertapenem and flagyl, start vancomycin and avycaz. Electronically signed by Dia Crawford, DO PGY1Internal Medicine10/4/20249:08 PM

## 2023-09-26 NOTE — Plan of Care
 Plan of Care Overview/ Patient Status    Patient aox4, colostomy intact, purewick in place. All pills whole. Reports discomfort to b/l legs, given tylenol. Frequent rounding, safety maintained.Problem: Diabetes ComorbidityGoal: Blood Glucose Level Within Targeted RangeOutcome: Interventions implemented as appropriate Problem: Violence Risk or ActualGoal: Anger and Impulse ControlOutcome: Interventions implemented as appropriate Problem: Adult Inpatient Plan of CareGoal: Plan of Care ReviewOutcome: Interventions implemented as appropriateGoal: Patient-Specific Goal (Individualized)Outcome: Interventions implemented as appropriateGoal: Absence of Hospital-Acquired Illness or InjuryOutcome: Interventions implemented as appropriateGoal: Optimal Comfort and WellbeingOutcome: Interventions implemented as appropriateGoal: Readiness for Transition of CareOutcome: Interventions implemented as appropriate Problem: Fall Injury RiskGoal: Absence of Fall and Fall-Related InjuryOutcome: Interventions implemented as appropriate Problem: Wound Healing ProgressionGoal: Optimal Wound HealingOutcome: Interventions implemented as appropriate Problem: Skin Injury Risk IncreasedGoal: Skin Health and IntegrityOutcome: Interventions implemented as appropriate Problem: InfectionGoal: Absence of Infection Signs and SymptomsOutcome: Interventions implemented as appropriate Problem: Physical Therapy GoalsGoal: Physical Therapy GoalsDescription: 1) bed mob w/ minx12) txfrs w/ RW mod a x2Outcome: Interventions implemented as appropriate

## 2023-09-26 NOTE — Plan of Care
 Plan of Care Overview/ Patient Status    Remains normotensive at this timeProblem: Diabetes ComorbidityGoal: Blood Glucose Level Within Targeted RangeOutcome: Interventions implemented as appropriate Problem: Violence Risk or ActualGoal: Anger and Impulse ControlOutcome: Interventions implemented as appropriate Problem: Adult Inpatient Plan of CareGoal: Plan of Care ReviewOutcome: Interventions implemented as appropriateGoal: Patient-Specific Goal (Individualized)Outcome: Interventions implemented as appropriateGoal: Absence of Hospital-Acquired Illness or InjuryOutcome: Interventions implemented as appropriateGoal: Optimal Comfort and WellbeingOutcome: Interventions implemented as appropriateGoal: Readiness for Transition of CareOutcome: Interventions implemented as appropriate

## 2023-09-26 NOTE — Progress Notes
 Urology Progress NoteCrystal A [name] is a 67 y.o. female,  2 Days Post-Op Procedure(s) (LRB):Cystoscopy left ureteroscopy, laser lithotripsy, stone extraction, stent placement., removal left ureteral stent (Left)Events noted last night, pt had RRT last evening for hypotensionErtapenem and flagyl were discontinued and Vancomycin and Avycaz started and patient received 1L LR bolusPatient transferred to ICADenies SOB, chest pain, nausea/vomiting, abd painVoiding without difficultyVitals: Vitals:  09/26/23 0404 BP: 95/65 Pulse: 78 Resp: 18 Temp: 98.9 ?F (37.2 ?C) Abdomen: Colostomy intact and functioningAbdomen soft, NT, NDLabs:Complete Blood CountResults in Past 7 DaysResult Component Current Result Hematocrit 28.00 (L) (09/26/2023) Hemoglobin 8.7 (L) (09/26/2023) MCH 28.1 (09/26/2023) MCHC 31.1 (09/26/2023) MCV 90.3 (09/26/2023) MPV 9.6 (09/26/2023) Platelets 297 (09/26/2023) RBC 3.10 (L) (09/26/2023) WBC 37.0 (H) (09/26/2023) Basic Metabolic PanelResults in Past 7 DaysResult Component Current Result BUN 14 (09/26/2023) Calcium 8.9 (09/26/2023) Chloride 123 (H) (09/26/2023) CO2 19 (L) (09/26/2023) Creatinine 0.81 (09/26/2023) eGFR (Creatinine) >60 (09/26/2023) Glucose 84 (09/26/2023) Potassium 3.7 (09/26/2023) Sodium 147 (H) (09/26/2023) Assessment: Procedure(s) (LRB):Cystoscopy left ureteroscopy, laser lithotripsy, stone extraction, stent placement., removal left ureteral stent (Left) 2 Days Post-Op, RRT called last evening for hypotension and patient transferred to [facility].Plan: Will hold off on renal US as WBC count decreasingF/u blood and urine culturesContinue Vancomycin and Avycaz as per IDContinue current care as per medicine

## 2023-09-26 NOTE — Progress Notes
 [facility] Progress NoteAttending Provider: Lyndee Leo, DO Subjective                                                                              Subjective: Interim History: RRT called yesterday due to lower BP, abx changed to ceftazidime. Empirically started on C diff treatment, stopped after C diff negative. Patient feeling okay, better than yesterday but not great. BP has been on the softer side, now on supplemental oxygen 2-3L, CXR last night showing right middle lobe atelectasis. Review of Allergies/Meds/Hx: Review of Allergies/Meds/Hx:I have reviewed the patient's: allergies, current scheduled medications, current infusions, current prn medications, past medical history, past surgical history, family history, social history, and prior to admission medications Objective Objective: Vitals:Last 24 hours: Temp:  [97.1 ?F (36.2 ?C)-98.9 ?F (37.2 ?C)] 98.4 ?F (36.9 ?C)Pulse:  [65-84] 75Resp:  [16-20] 20BP: (75-101)/(46-65) 101/56SpO2:  [94 %-100 %] 95 %I/O's:Gross Totals (Last 24 hours) at 09/26/2023 1128Last data filed at 09/26/2023 0800Intake 4799.2 ml Output 600 ml Net 4199.2 ml Physical ExamConstitutional:     Appearance: Normal appearance. HENT:    Head: Normocephalic and atraumatic. Cardiovascular:    Rate and Rhythm: Normal rate and regular rhythm.    Pulses: Normal pulses.    Heart sounds: Normal heart sounds. Pulmonary:    Effort: Pulmonary effort is normal.    Breath sounds: Normal breath sounds. Abdominal:    General: Abdomen is flat. Bowel sounds are normal.    Comments: Colostomy  Musculoskeletal:       General: No swelling. Skin:   General: Skin is warm and dry. Neurological:    Mental Status: She is alert and oriented to person, place, and time. Mental status is at baseline.  Labs:Last 24 hours: Recent Results (from the past 24 hour(s)) CBC auto differential Collection Time: 09/25/23 12:05 PM Result Value Ref Range  WBC 46.6 (H) 4.0 - 11.0 x1000/?L  RBC 3.16 (L) 4.00 - 6.00 M/?L  Hemoglobin 8.8 (L) 11.7 - 15.5 g/dL  Hematocrit 62.95 (L) 28.41 - 45.00 %  MCV 91.1 80.0 - 100.0 fL  MCH 27.8 27.0 - 33.0 pg  MCHC 30.6 (L) 31.0 - 36.0 g/dL  RDW-CV 32.4 (H) 40.1 - 15.0 %  Platelets 328 150 - 420 x1000/?L  MPV 9.5 8.0 - 12.0 fL  nRBC 0.0 0.0 - 1.0 %  Absolute nRBC 0.00 0.00 - 1.00 x 1000/?L Manual Differential  Collection Time: 09/25/23 12:05 PM Result Value Ref Range  Neutrophils 83.4 (H) 39.0 - 72.0 %  Bands 7.5 0.0 - 10.0 %  Lymphocytes 5.8 (L) 17.0 - 50.0 %  Monocytes 3.3 (L) 4.0 - 12.0 %  Eosinophils 0.0 0.0 - 5.0 %  Basophil 0.0 0.0 - 1.4 %  Neutrophils Absolute 42.40 (H) 2.00 - 7.60 x 1000/?L  Lymphocyte Absolute 2.71 0.60 - 3.70 x 1000/?L  Monocyte Absolute Count 1.54 (H) 0.00 - 1.00 x 1000/?L  Eosinophil Absolute Count 0.00 0.00 - 1.00 x 1000/?L  Basophil Absolute Count 0.00 0.00 - 1.00 x 1000/?L  RBC Morphology Reviewed   Burr Cells 1+ (A) None  Ovalocytes 1+ (A) None  Giant PLTs Present None Sedimentation rate (ESR)  Collection Time: 09/25/23 12:05 PM Result Value Ref  Range  Sedimentation Rate (ESR) 60 (H) 0 - 20 mm/hr Lactic acid, plasma (reflex 2h repeat)  Collection Time: 09/25/23  1:13 PM Result Value Ref Range  Lactate 2.9 (H) 0.4 - 2.0 mmol/L CBC auto differential  Collection Time: 09/25/23  3:37 PM Result Value Ref Range  WBC 40.4 (H) 4.0 - 11.0 x1000/?L  RBC 3.13 (L) 4.00 - 6.00 M/?L  Hemoglobin 8.8 (L) 11.7 - 15.5 g/dL  Hematocrit 09.60 (L) 45.40 - 45.00 %  MCV 89.8 80.0 - 100.0 fL  MCH 28.1 27.0 - 33.0 pg  MCHC 31.3 31.0 - 36.0 g/dL  RDW-CV 98.1 (H) 19.1 - 15.0 %  Platelets 311 150 - 420 x1000/?L  MPV 9.4 8.0 - 12.0 fL  nRBC 0.0 0.0 - 1.0 %  Absolute nRBC 0.00 0.00 - 1.00 x 1000/?L Manual Differential  Collection Time: 09/25/23  3:37 PM Result Value Ref Range  Neutrophils 86.0 (H) 39.0 - 72.0 %  Bands 9.9 0.0 - 10.0 %  Lymphocytes 4.1 (L) 17.0 - 50.0 %  Monocytes 0.0 (L) 4.0 - 12.0 %  Eosinophils 0.0 0.0 - 5.0 %  Basophil 0.0 0.0 - 1.4 %  Neutrophils Absolute 38.72 (H) 2.00 - 7.60 x 1000/?L  Lymphocyte Absolute 1.66 0.60 - 3.70 x 1000/?L  Monocyte Absolute Count 0.00 0.00 - 1.00 x 1000/?L  Eosinophil Absolute Count 0.00 0.00 - 1.00 x 1000/?L  Basophil Absolute Count 0.00 0.00 - 1.00 x 1000/?L  RBC Morphology Reviewed   Hypochromia 1+ (A) None  Ovalocytes 1+ (A) None  Giant PLTs Present None Blood culture #1  Collection Time: 09/25/23  3:45 PM  Specimen: Peripheral; Blood Result Value Ref Range  Blood Culture No Growth to Date  Lactic acid, plasma (reflex 2h repeat)  Collection Time: 09/25/23  3:45 PM Result Value Ref Range  Lactate 2.0 0.4 - 2.0 mmol/L Comprehensive metabolic panel  Collection Time: 09/25/23  3:45 PM Result Value Ref Range  Sodium 145 136 - 145 mmol/L  Potassium 3.9 3.5 - 5.1 mmol/L  Chloride 119 (H) 95 - 115 mmol/L  CO2 18 (L) 21 - 32 mmol/L  Anion Gap 8 5 - 18  Glucose 85 70 - 100 mg/dL  BUN 15 8 - 25 mg/dL  Creatinine 4.78 2.95 - 1.30 mg/dL  Calcium 8.8 8.4 - 62.1 mg/dL  BUN/Creatinine Ratio 30.8 8.0 - 25.0  Total Protein 6.8 6.4 - 8.2 g/dL  Albumin 2.1 (L) 3.4 - 5.0 g/dL  Total Bilirubin 0.3 0.0 - 1.0 mg/dL  Alkaline Phosphatase 61 20 - 120 U/L  Alanine Aminotransferase (ALT) 11 (L) 12 - 78 U/L  Aspartate Aminotransferase (AST) 35 5 - 37 U/L  Globulin 4.7 g/dL  A/G Ratio 0.4   AST/ALT Ratio 3.2 Reference Range Not Established  Osmolality Calculation 289 275 - 295 mOsm/kg  eGFR (Creatinine) >60 >=60 mL/min/1.33m2 EKG  Collection Time: 09/25/23  7:57 PM Result Value Ref Range  Heart Rate 79 bpm  QRS Interval 78 ms  QT Interval 384 ms  QTC Interval 440 ms  P Axis 48 deg  QRS Axis 13 deg  T Wave Axis 54 deg  P-R Interval 138 msec  SEVERITY Normal ECG severity Magnesium  Collection Time: 09/25/23  8:01 PM Result Value Ref Range  Magnesium 1.4 1.4 - 2.2 mg/dL Phosphorus  Collection Time: 09/25/23  8:01 PM Result Value Ref Range  Phosphorus 1.8 (L) 2.5 - 4.9 mg/dL Procalcitonin     (BH GH LMW Q YH)  Collection Time: 09/25/23  8:01 PM  Result Value Ref Range  Procalcitonin 143.15 (H) See Comment ng/mL C-reactive protein (CRP)  Collection Time: 09/25/23  8:01 PM Result Value Ref Range  C-Reactive Protein 15.5 (H) 0.0 - 1.0 mg/dL CBC auto differential  Collection Time: 09/25/23  8:01 PM Result Value Ref Range  WBC 39.7 (H) 4.0 - 11.0 x1000/?L  RBC 3.04 (L) 4.00 - 6.00 M/?L  Hemoglobin 8.5 (L) 11.7 - 15.5 g/dL  Hematocrit 16.10 (L) 96.04 - 45.00 %  MCV 89.1 80.0 - 100.0 fL  MCH 28.0 27.0 - 33.0 pg  MCHC 31.4 31.0 - 36.0 g/dL  RDW-CV 54.0 (H) 98.1 - 15.0 %  Platelets 330 150 - 420 x1000/?L  MPV 9.5 8.0 - 12.0 fL  nRBC 0.0 0.0 - 1.0 %  Absolute nRBC 0.00 0.00 - 1.00 x 1000/?L Comprehensive metabolic panel  Collection Time: 09/25/23  8:01 PM Result Value Ref Range  Sodium 145 136 - 145 mmol/L  Potassium 3.4 (L) 3.5 - 5.1 mmol/L  Chloride 121 (H) 95 - 115 mmol/L  CO2 19 (L) 21 - 32 mmol/L  Anion Gap 5 5 - 18  Glucose 90 70 - 100 mg/dL  BUN 14 8 - 25 mg/dL  Creatinine 1.91 4.78 - 1.30 mg/dL  Calcium 8.8 8.4 - 29.5 mg/dL  BUN/Creatinine Ratio 62.1 8.0 - 25.0  Total Protein 6.4 6.4 - 8.2 g/dL  Albumin 2.0 (L) 3.4 - 5.0 g/dL  Total Bilirubin 0.2 0.0 - 1.0 mg/dL  Alkaline Phosphatase 63 20 - 120 U/L  Alanine Aminotransferase (ALT) 13 12 - 78 U/L  Aspartate Aminotransferase (AST) 39 (H) 5 - 37 U/L  Globulin 4.4 g/dL  A/G Ratio 0.5   AST/ALT Ratio 3.0 Reference Range Not Established  Osmolality Calculation 289 275 - 295 mOsm/kg  eGFR (Creatinine) >60 >=60 mL/min/1.43m2 Manual Differential  Collection Time: 09/25/23  8:01 PM Result Value Ref Range  Neutrophils 79.6 (H) 39.0 - 72.0 %  Bands 13.3 (H) 0.0 - 10.0 %  Lymphocytes 2.7 (L) 17.0 - 50.0 %  Monocytes 4.4 4.0 - 12.0 %  Eosinophils 0.0 0.0 - 5.0 %  Basophil 0.0 0.0 - 1.4 %  Neutrophils Absolute 36.88 (H) 2.00 - 7.60 x 1000/?L  Lymphocyte Absolute 1.07 0.60 - 3.70 x 1000/?L  Monocyte Absolute Count 1.75 (H) 0.00 - 1.00 x 1000/?L  Eosinophil Absolute Count 0.00 0.00 - 1.00 x 1000/?L  Basophil Absolute Count 0.00 0.00 - 1.00 x 1000/?L  RBC Morphology Reviewed   Hypochromia 1+ (A) None  Ovalocytes 1+ (A) None  Tear Drop Cells 1+ (A) None Lactic acid, plasma (reflex 2h repeat)  Collection Time: 09/25/23  8:05 PM Result Value Ref Range  Lactate 3.3 (H) 0.4 - 2.0 mmol/L C. difficile Assay  Collection Time: 09/25/23  8:32 PM  Specimen: Stool Result Value Ref Range  C. difficile Interpretation      Toxigenic C. difficile DNA was NOT detected. See narrative below.  C. difficile Toxin B DNA PCR Negative Negative Reflex lactic acid, plasma  Collection Time: 09/25/23 10:58 PM Result Value Ref Range  Lactate 2.1 (H) 0.4 - 2.0 mmol/L Lactic acid, plasma  Collection Time: 09/26/23  4:30 AM Result Value Ref Range  Lactate 1.3 0.4 - 2.0 mmol/L CBC auto differential  Collection Time: 09/26/23  4:30 AM Result Value Ref Range  WBC 37.0 (H) 4.0 - 11.0 x1000/?L  RBC 3.10 (L) 4.00 - 6.00 M/?L  Hemoglobin 8.7 (L) 11.7 - 15.5 g/dL  Hematocrit 30.86 (L) 57.84 - 45.00 %  MCV 90.3  80.0 - 100.0 fL  MCH 28.1 27.0 - 33.0 pg  MCHC 31.1 31.0 - 36.0 g/dL  RDW-CV 11.9 (H) 14.7 - 15.0 %  Platelets 297 150 - 420 x1000/?L  MPV 9.6 8.0 - 12.0 fL  Neutrophils 87.4 (H) 39.0 - 72.0 %  Lymphocytes 3.6 (L) 17.0 - 50.0 %  Monocytes 3.8 (L) 4.0 - 12.0 %  Eosinophils 0.3 0.0 - 5.0 %  Basophil 0.3 0.0 - 1.4 %  Immature Granulocytes 4.6 (H) 0.0 - 1.0 %  nRBC 0.0 0.0 - 1.0 %  Absolute Lymphocyte Count 1.33 0.60 - 3.70 x 1000/?L Monocyte Absolute Count 1.42 (H) 0.00 - 1.00 x 1000/?L  Eosinophil Absolute Count 0.12 0.00 - 1.00 x 1000/?L  Basophil Absolute Count 0.11 0.00 - 1.00 x 1000/?L  Absolute Immature Granulocyte Count 1.69 (H) 0.00 - 0.30 x 1000/?L  Absolute nRBC 0.00 0.00 - 1.00 x 1000/?L  ANC (Abs Neutrophil Count) 32.37 (H) 2.00 - 7.60 x 1000/?L Comprehensive metabolic panel  Collection Time: 09/26/23  4:30 AM Result Value Ref Range  Sodium 147 (H) 136 - 145 mmol/L  Potassium 3.7 3.5 - 5.1 mmol/L  Chloride 123 (H) 95 - 115 mmol/L  CO2 19 (L) 21 - 32 mmol/L  Anion Gap 5 5 - 18  Glucose 84 70 - 100 mg/dL  BUN 14 8 - 25 mg/dL  Creatinine 8.29 5.62 - 1.30 mg/dL  Calcium 8.9 8.4 - 13.0 mg/dL  BUN/Creatinine Ratio 86.5 8.0 - 25.0  Total Protein 6.1 (L) 6.4 - 8.2 g/dL  Albumin 2.0 (L) 3.4 - 5.0 g/dL  Total Bilirubin 0.2 0.0 - 1.0 mg/dL  Alkaline Phosphatase 70 20 - 120 U/L  Alanine Aminotransferase (ALT) 14 12 - 78 U/L  Aspartate Aminotransferase (AST) 35 5 - 37 U/L  Globulin 4.1 g/dL  A/G Ratio 0.5   AST/ALT Ratio 2.5 Reference Range Not Established  Osmolality Calculation 292 275 - 295 mOsm/kg  eGFR (Creatinine) >60 >=60 mL/min/1.40m2 Procalcitonin     (BH GH LMW Q YH)  Collection Time: 09/26/23  4:30 AM Result Value Ref Range  Procalcitonin 113.78 (H) See Comment ng/mL C-reactive protein (CRP)  Collection Time: 09/26/23  4:30 AM Result Value Ref Range  C-Reactive Protein 14.7 (H) 0.0 - 1.0 mg/dL Diagnostics: Assessment Assessment: Assessment:Principal Problem:  Sepsis, due to unspecified organism, unspecified whether acute organ dysfunction present (HC Code) (HC CODE) (HC Code)  SNOMED [place](R): SEPSIS  Active Problems:  UTI (urinary tract infection)  SNOMED [place](R): URINARY TRACT INFECTIOUS DISEASE    S/P cystoscopy with ureteral stent placement  SNOMED [place](R): HISTORY OF INSERTION OF STENT INTO URETER    UTI due to extended-spectrum beta lactamase (ESBL) producing Escherichia coli  SNOMED [place](R): INFECTION CAUSED BY EXTENDED SPECTRUM BETA-LACTAMASE PRODUCING ESCHERICHIA COLI    Plan Plan: Patient is a 67 year old woman with PMHx of cognitive impairment, hx DVT, RA, 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 Patient seen by urology, pending left stent exchange on Thursday 10/3. Midline placed 9/30, eliquis transitioned to lovenox for procedure. Patient had procedure 10/3, POD#1 with leukocytosis of 44 and sepsis, [place] AP with expected post procedure changes. Ertapenem changed to ceftazidime-avibactam.  #  Sepsis secondary to acute pyeloureteritis with E coli (ESBL) bacteremia # s/p cystoscopy left stent exchange and stone removal POD#2- initially admitted with pyelonephritis - ICU evaluation completed by Dr. Raina Mina on 9/24. - Urology note by Dr. Hulen Luster 9/24: left pyelonephritis excellent stent placement stone is not visualizedMaybe pass- s/p 09/24/2023 cystoscopy, left ureteral replacement, removal of stones with Dr. Hulen Luster- 07/15/2023: right stent was removed  and left stent was advanced to the renal pelvis, small perforation seen, mild extravasation.- Has been having intermittent drops in blood pressures that have been fluid responsive. - ICU evaluation completed by Dr. Raina Mina on 9/24. - Urology note by Dr. Hulen Luster 9/24: left pyelonephritis excellent stent placement stone is not visualizedMaybe passHopefully this hospitalization early next week we can consider ureteroscopy see if the stone is still there and removal of stent- Blood cx with E coli ESBL . Urine cx the same- repeat blood culture 9/29 NGTD- s/p ertapenem D 11- continue with ceftazidime avibactam D2- follow up on cultures- s/p 10/3 ureteroscopy stent exchange destruction of stones- LR 100cc/hr# Hypoxia- asymptomatic, no symptom of pneumonia- likely atelectasis as well as fluid overload- incentive spirometer # Acute on chronic anemia - no signs of bleeding and stool occult negative- partly dilutional due to IV fluid and inflammation from sepsis- s/p 1 unit PRBC 10/3- ferrous gluconate 324mg  every other day- stool occult blood negative # Hx DVT - c/w eliquis # Presence of ostomy - Routine ostomy care # Hypokalemia- replete # HLD- continue crestor 10mg  once daily Other issues stable Hx RAHx peripheral neuropathy - gabapentin dose reduced.  ====* Regular diet* DVT ppx: eliquis* Full code Patient was seen and examined on 09/26/2023 Comorbidities Comorbidities present on admission:Coagulopathic due to prescription of anticoagulant medication prior to admission. Secondary diagnoses occurring during hospitalization:HypomagnesemiaHyponatremiaHypernatremiaHypophosphatemia Electronically Signed:Hinley Brimage, DO Beeper 225310/04/2023, 11:26 AM

## 2023-09-27 LAB — CBC WITH AUTO DIFFERENTIAL
BKR WAM ABSOLUTE IMMATURE GRANULOCYTES.: 0.24 x 1000/ÂµL (ref 0.00–0.30)
BKR WAM ABSOLUTE LYMPHOCYTE COUNT.: 1.29 x 1000/ÂµL (ref 0.60–3.70)
BKR WAM ABSOLUTE NRBC (2 DEC): 0 x 1000/ÂµL (ref 0.00–1.00)
BKR WAM ANC (ABSOLUTE NEUTROPHIL COUNT): 17.93 x 1000/ÂµL — ABNORMAL HIGH (ref 2.00–7.60)
BKR WAM BASOPHIL ABSOLUTE COUNT.: 0.07 x 1000/ÂµL (ref 0.00–1.00)
BKR WAM BASOPHILS: 0.3 % (ref 0.0–1.4)
BKR WAM EOSINOPHIL ABSOLUTE COUNT.: 0.35 x 1000/ÂµL (ref 0.00–1.00)
BKR WAM EOSINOPHILS: 1.7 % (ref 0.0–5.0)
BKR WAM HEMATOCRIT (2 DEC): 27.9 % — ABNORMAL LOW (ref 35.00–45.00)
BKR WAM HEMOGLOBIN: 8.8 g/dL — ABNORMAL LOW (ref 11.7–15.5)
BKR WAM IMMATURE GRANULOCYTES: 1.2 % — ABNORMAL HIGH (ref 0.0–1.0)
BKR WAM LYMPHOCYTES: 6.3 % — ABNORMAL LOW (ref 17.0–50.0)
BKR WAM MCH (PG): 28.2 pg (ref 27.0–33.0)
BKR WAM MCHC: 31.5 g/dL (ref 31.0–36.0)
BKR WAM MCV: 89.4 fL (ref 80.0–100.0)
BKR WAM MONOCYTE ABSOLUTE COUNT.: 0.71 x 1000/ÂµL (ref 0.00–1.00)
BKR WAM MONOCYTES: 3.4 % — ABNORMAL LOW (ref 4.0–12.0)
BKR WAM MPV: 9.9 fL (ref 8.0–12.0)
BKR WAM NEUTROPHILS: 87.1 % — ABNORMAL HIGH (ref 39.0–72.0)
BKR WAM NUCLEATED RED BLOOD CELLS: 0 % (ref 0.0–1.0)
BKR WAM PLATELETS: 307 x1000/ÂµL (ref 150–420)
BKR WAM RDW-CV: 17.3 % — ABNORMAL HIGH (ref 11.0–15.0)
BKR WAM RED BLOOD CELL COUNT.: 3.12 M/ÂµL — ABNORMAL LOW (ref 4.00–6.00)
BKR WAM WHITE BLOOD CELL COUNT: 20.6 x1000/ÂµL — ABNORMAL HIGH (ref 4.0–11.0)

## 2023-09-27 LAB — COMPREHENSIVE METABOLIC PANEL
BKR A/G RATIO: 0.5
BKR ALANINE AMINOTRANSFERASE (ALT): 11 U/L — ABNORMAL LOW (ref 12–78)
BKR ALBUMIN: 1.8 g/dL — ABNORMAL LOW (ref 3.4–5.0)
BKR ALKALINE PHOSPHATASE: 60 U/L (ref 20–120)
BKR ANION GAP: 1 — ABNORMAL LOW (ref 5–18)
BKR ASPARTATE AMINOTRANSFERASE (AST): 20 U/L (ref 5–37)
BKR AST/ALT RATIO: 1.8
BKR BILIRUBIN TOTAL: 0.2 mg/dL (ref 0.0–1.0)
BKR BLOOD UREA NITROGEN: 11 mg/dL (ref 8–25)
BKR BUN / CREAT RATIO: 11.8 (ref 8.0–25.0)
BKR CALCIUM: 9 mg/dL (ref 8.4–10.3)
BKR CHLORIDE: 126 mmol/L — ABNORMAL HIGH (ref 95–115)
BKR CO2: 23 mmol/L (ref 21–32)
BKR CREATININE: 0.93 mg/dL (ref 0.50–1.30)
BKR EGFR, CREATININE (CKD-EPI 2021): >60 mL/min/{1.73_m2} (ref >=60)
BKR GLOBULIN: 3.9 g/dL
BKR GLUCOSE: 81 mg/dL (ref 70–100)
BKR OSMOLALITY CALCULATION: 296 mosm/kg — ABNORMAL HIGH (ref 275–295)
BKR POTASSIUM: 3.8 mmol/L (ref 3.5–5.1)
BKR PROTEIN TOTAL: 5.7 g/dL — ABNORMAL LOW (ref 6.4–8.2)
BKR SODIUM: 150 mmol/L — ABNORMAL HIGH (ref 136–145)

## 2023-09-27 LAB — C-REACTIVE PROTEIN     (CRP): BKR C-REACTIVE PROTEIN: 8.1 mg/dL — ABNORMAL HIGH (ref 0.0–1.0)

## 2023-09-27 LAB — SODIUM: BKR SODIUM: 149 mmol/L — ABNORMAL HIGH (ref 136–145)

## 2023-09-27 MED ORDER — ERTAPENEM 1000 MG MBP
INTRAVENOUS | Status: CP
Start: 2023-09-27 — End: 2023-09-30
  Administered 2023-09-27 – 2023-09-29 (×3): 50.000 mL/h via INTRAVENOUS

## 2023-09-27 MED ORDER — DEXTROSE 5 % IN WATER (D5W) INTRAVENOUS SOLUTION
INTRAVENOUS | Status: AC
Start: 2023-09-27 — End: 2023-09-30
  Administered 2023-09-27 – 2023-09-30 (×6): 50.000 mL/h via INTRAVENOUS

## 2023-09-27 NOTE — Plan of Care
 Plan of Care Overview/ Patient Status    Pt aox4, BP soft but stable. All pills whole. Bedrest. Wheelchair at baseline. Redness to bottom, cream applied. Turned and repositioned. Denies pain. IV fluids running. Ileostomy with good output. Bag intact, stoma red. To left abdomen is an old stoma site with mucous (stool?) present. Dressing changed, stoma cleaned. MD Selena Batten and MD Cho at bedside. Gauze and foam changed this shift and prn. Purewick in place. Safety maintained.Problem: Diabetes ComorbidityGoal: Blood Glucose Level Within Targeted RangeOutcome: Interventions implemented as appropriate Problem: Violence Risk or ActualGoal: Anger and Impulse ControlOutcome: Interventions implemented as appropriate Problem: Adult Inpatient Plan of CareGoal: Plan of Care ReviewOutcome: Interventions implemented as appropriateGoal: Patient-Specific Goal (Individualized)Outcome: Interventions implemented as appropriateGoal: Absence of Hospital-Acquired Illness or InjuryOutcome: Interventions implemented as appropriateGoal: Optimal Comfort and WellbeingOutcome: Interventions implemented as appropriateGoal: Readiness for Transition of CareOutcome: Interventions implemented as appropriate Problem: Fall Injury RiskGoal: Absence of Fall and Fall-Related InjuryOutcome: Interventions implemented as appropriate Problem: Wound Healing ProgressionGoal: Optimal Wound HealingOutcome: Interventions implemented as appropriate Problem: Skin Injury Risk IncreasedGoal: Skin Health and IntegrityOutcome: Interventions implemented as appropriate Problem: InfectionGoal: Absence of Infection Signs and SymptomsOutcome: Interventions implemented as appropriate Problem: Physical Therapy GoalsGoal: Physical Therapy GoalsDescription: 1) bed mob w/ minx12) txfrs w/ RW mod a x2Outcome: Interventions implemented as appropriate

## 2023-09-27 NOTE — Progress Notes
 [facility] Progress NoteAttending Provider: Lyndee Leo, DO Subjective                                                                              Subjective: Interim History: no acute overnight events. Patient feeling well today. Nurse was concerned that mucus tract was draining stool, Dr. Selena Batten consulted who examined patient, unlikely stool and likely mucus that had accumulated. Patient is asymptomatic and feeling great today. Transfer out of [facility]. Review of Allergies/Meds/Hx: Review of Allergies/Meds/Hx:I have reviewed the patient's: allergies, current scheduled medications, current infusions, current prn medications, past medical history, past surgical history, family history, social history, and prior to admission medications Objective Objective: Vitals:Last 24 hours: Temp:  [97.6 ?F (36.4 ?C)-98.7 ?F (37.1 ?C)] 98.7 ?F (37.1 ?C)Pulse:  [61-72] 61Resp:  [18-20] 20BP: (91-101)/(45-63) 97/55SpO2:  [94 %-99 %] 95 %I/O's:Gross Totals (Last 24 hours) at 09/27/2023 1120Last data filed at 09/27/2023 0900Intake 2784.7 ml Output 2025 ml Net 759.7 ml Physical ExamConstitutional:     Appearance: Normal appearance. HENT:    Head: Normocephalic and atraumatic. Cardiovascular:    Rate and Rhythm: Normal rate and regular rhythm.    Pulses: Normal pulses.    Heart sounds: Normal heart sounds. Pulmonary:    Effort: Pulmonary effort is normal.    Breath sounds: Normal breath sounds. Abdominal:    General: Abdomen is flat. Bowel sounds are normal.    Palpations: Abdomen is soft.    Comments: ileostomy in place, mucus fistula covered with dressing Skin:   General: Skin is dry. Neurological:    Mental Status: She is alert and oriented to person, place, and time. Mental status is at baseline.    Comments: Motor strength 2-3/5 bilateral lower extremities  Labs:Last 24 hours: Recent Results (from the past 24 hour(s)) CBC auto differential  Collection Time: 09/27/23  7:39 AM Result Value Ref Range  WBC 20.6 (H) 4.0 - 11.0 x1000/?L  RBC 3.12 (L) 4.00 - 6.00 M/?L  Hemoglobin 8.8 (L) 11.7 - 15.5 g/dL  Hematocrit 16.10 (L) 96.04 - 45.00 %  MCV 89.4 80.0 - 100.0 fL  MCH 28.2 27.0 - 33.0 pg  MCHC 31.5 31.0 - 36.0 g/dL  RDW-CV 54.0 (H) 98.1 - 15.0 %  Platelets 307 150 - 420 x1000/?L  MPV 9.9 8.0 - 12.0 fL  Neutrophils 87.1 (H) 39.0 - 72.0 %  Lymphocytes 6.3 (L) 17.0 - 50.0 %  Monocytes 3.4 (L) 4.0 - 12.0 %  Eosinophils 1.7 0.0 - 5.0 %  Basophil 0.3 0.0 - 1.4 %  Immature Granulocytes 1.2 (H) 0.0 - 1.0 %  nRBC 0.0 0.0 - 1.0 %  Absolute Lymphocyte Count 1.29 0.60 - 3.70 x 1000/?L  Monocyte Absolute Count 0.71 0.00 - 1.00 x 1000/?L  Eosinophil Absolute Count 0.35 0.00 - 1.00 x 1000/?L  Basophil Absolute Count 0.07 0.00 - 1.00 x 1000/?L  Absolute Immature Granulocyte Count 0.24 0.00 - 0.30 x 1000/?L  Absolute nRBC 0.00 0.00 - 1.00 x 1000/?L  ANC (Abs Neutrophil Count) 17.93 (H) 2.00 - 7.60 x 1000/?L Comprehensive metabolic panel  Collection Time: 09/27/23  7:39 AM Result Value Ref Range  Sodium 150 (H) 136 - 145 mmol/L  Potassium 3.8 3.5 -  5.1 mmol/L  Chloride 126 (H) 95 - 115 mmol/L  CO2 23 21 - 32 mmol/L  Anion Gap 1 (L) 5 - 18  Glucose 81 70 - 100 mg/dL  BUN 11 8 - 25 mg/dL  Creatinine 1.61 0.96 - 1.30 mg/dL  Calcium 9.0 8.4 - 04.5 mg/dL  BUN/Creatinine Ratio 40.9 8.0 - 25.0  Total Protein 5.7 (L) 6.4 - 8.2 g/dL  Albumin 1.8 (L) 3.4 - 5.0 g/dL  Total Bilirubin 0.2 0.0 - 1.0 mg/dL  Alkaline Phosphatase 60 20 - 120 U/L  Alanine Aminotransferase (ALT) 11 (L) 12 - 78 U/L  Aspartate Aminotransferase (AST) 20 5 - 37 U/L  Globulin 3.9 g/dL  A/G Ratio 0.5   AST/ALT Ratio 1.8 Reference Range Not Established  Osmolality Calculation 296 (H) 275 - 295 mOsm/kg  eGFR (Creatinine) >60 >=60 mL/min/1.41m2 Diagnostics: Assessment Assessment: Assessment:Principal Problem:  Sepsis, due to unspecified organism, unspecified whether acute organ dysfunction present (HC Code) (HC CODE) (HC Code)  SNOMED [place](R): SEPSIS  Active Problems:  UTI (urinary tract infection)  SNOMED [place](R): URINARY TRACT INFECTIOUS DISEASE    S/P cystoscopy with ureteral stent placement  SNOMED [place](R): HISTORY OF INSERTION OF STENT INTO URETER    UTI due to extended-spectrum beta lactamase (ESBL) producing Escherichia coli  SNOMED [place](R): INFECTION CAUSED BY EXTENDED SPECTRUM BETA-LACTAMASE PRODUCING ESCHERICHIA COLI    Plan Plan: Patient is a 68 year old woman with PMHx of cognitive impairment, hx DVT, RA, 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 Patient seen by urology, pending left stent exchange on Thursday 10/3. Midline placed 9/30, eliquis transitioned to lovenox for procedure. Patient had procedure 10/3, POD#1 with leukocytosis of 44 and sepsis,  AP with expected post procedure changes. Ertapenem changed to ceftazidime-avibactam. Patient is improving.  # Sepsis secondary to acute pyeloureteritis with E coli (ESBL) bacteremia # s/p cystoscopy left stent exchange and stone removal POD#3- initially admitted with pyelonephritis and bacteremia, started on ertapenem, blood culture 9/23 and urine culture growing esbl ecoli- Urology note by Dr. Hulen Luster 9/24: left pyelonephritis excellent stent placement stone is not visualizedMaybe pass- s/p 09/24/2023 cystoscopy, left ureteral replacement, removal of stones with Dr. Hulen Luster, (prior procedure 07/15/2023: right stent was removed  and left stent was advanced to the renal pelvis, small perforation seen, mild extravasation)- Has been having intermittent drops in blood pressures that have been fluid responsive. - Blood cx with E coli ESBL . Urine cx the same- repeat blood culture 9/29 NGTD- s/p ertapenem D 11- continue with ceftazidime avibactam D3, Dr. Andrey Farmer from [facility] is following- follow up on cultures post procedure, NGTD# Hypernatremia- change IV fluid to D5W 50cc/hr- repeat Na this afternoon # Hypoxia- asymptomatic, no symptom of pneumonia- likely atelectasis as well as fluid overload- incentive spirometer # Acute on chronic anemia - no signs of bleeding and stool occult negative- partly dilutional due to IV fluid and inflammation from sepsis- s/p 1 unit PRBC 10/3- ferrous gluconate 324mg  every other day- stool occult blood negative # Hx DVT - c/w eliquis # Presence of ostomy - Routine ostomy care- please clean and change dress of the mucus fistula every other day- appreciate surgery consult today to check  mucus fistula # Hypokalemia- replete # HLD- continue crestor 10mg  once daily Other issues stable Hx RAHx peripheral neuropathy - gabapentin dose reduced.  ====* Regular diet* DVT ppx: eliquis* Full code Patient was seen and examined on 09/27/2023  Comorbidities Comorbidities present on admission:Coagulopathic due to prescription of anticoagulant medication prior to admission. Secondary diagnoses occurring during hospitalization:HypomagnesemiaHyponatremiaHypernatremiaHypophosphatemia Electronically Signed:Kindsey Eblin, DO Beeper 225310/05/2023, 11:18 AM

## 2023-09-27 NOTE — Other
 General Surgery Consult NotePatient Data:  Patient Name: Danielle [name] Age: 67 y.o. DOB: 01-27-1956 MRN: [name] Consult Information: Consultation requested by: Dr. Theodoro Grist, medicineReason for consultation: mucous fistula outputSource of Information: Patient and EMR/Previous RecordPresentation History: 67 y.o. female s/p exploratory laparotomy, right hemicolectomy, end ileostomy and mid transverse colon mucous fistula in Nov 2022 for cecal perforation due to sigmoid colon obstruction. Pt currently admitted for sepsis from pyeloureteritis. Surgery consulted due to concern of increased mucous fistula output. Pt seen and examined. Denies abdominal pain. Pt tolerating regular diet. 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 (HC Code) (HC CODE) (HC Code), Dysphagia, Encephalopathy, ESBL (extended spectrum beta-lactamase) producing bacteria infection, GERD (gastroesophageal reflux disease), Hypercalcemia, 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 CODE) (HC Code), Incontinent of urine, Paraplegia (HC Code), and Rheumatoid arthritis (HC Code).Past Surgical History:  has a past surgical history that includes Band hemorrhoidectomy; Laparoscopic salpingoopherectomy (1986); Cystectomy (1972); laparotomy (2022); partial colectomy (2022); Upper gastrointestinal endoscopy; PEG tube placement (01/2022); and Ileostomy.Family History: family history is not on file.Social History:  reports that she has never smoked. She has never used smokeless tobacco. She reports that she does not currently use alcohol. She reports that she does not use drugs.Prior to Admission Meds: Medications Prior to Admission Medication Sig Dispense Refill Last Dose  acetaminophen (TYLENOL) 325 mg tablet Take 3 tablets (975 mg total) by mouth every 8 (eight) hours as needed for pain.     apixaban (ELIQUIS) 5 mg tablet 1 tablet (5 mg total) by Per G Tube route every 12 (twelve) hours.   09/13/2023 at 2100  ferrous gluconate (FERGON) 324 mg (38 mg iron) tablet Take 1 tablet (324 mg total) by mouth daily.     folic acid (FOLVITE) 1 mg tablet Take 1 tablet (1 mg total) by mouth daily. 30 tablet 11 09/14/2023 at 0900  gabapentin (NEURONTIN) 400 mg capsule Take 1 capsule (400 mg total) by mouth 2 (two) times daily with breakfast and dinner.   09/14/2023 at 1700  gabapentin (NEURONTIN) 600 mg tablet Take 1 tablet (600 mg total) by mouth nightly.   09/13/2023 at 2100  hydrOXYchloroQUINE (PLAQUENIL) 200 mg tablet Take 1 tablet (200 mg total) by mouth daily. 14 tablet 0 09/14/2023 at 0900  magnesium hydroxide (MILK OF MAGNESIA) 400 mg/5 mL suspension Take 30 mLs by mouth daily as needed for constipation.     melatonin 3 mg tablet Take 1 tablet (3 mg total) by mouth nightly.  0 09/13/2023 at 2100  pantoprazole (PROTONIX) 40 mg tablet Take 1 tablet (40 mg total) by mouth daily. 30 tablet 11 09/14/2023 at 0600  pantoprazole (PROTONIX) 40 mg tablet Take 1 tablet (40 mg total) by mouth 2 (two) times daily.   09/14/2023 at 1700  polyethylene glycol (MIRALAX) 17 gram packet Take 1 packet (17 g total) by mouth daily. Mix in 8 ounces of water, juice, soda, coffee or tea prior to taking. 14 each 2 09/14/2023 at 0900  potassium citrate (UROCIT-K) 15 mEq extended release tablet Take 1 tablet (15 mEq total) by mouth 2 (two) times daily.   09/14/2023 at 1700  rosuvastatin (CRESTOR) 10 mg tablet 1 tablet (10 mg total) by Per G Tube route nightly.   09/14/2023 at 1700  traMADoL (ULTRAM) 50 mg  tablet Take 1 tablet (50 mg total) by mouth every 8 (eight) hours as needed for pain. Inpatient Medications:Current Facility-Administered Medications Medication Dose Route Frequency Provider Last Rate Last Admin  acetaminophen (TYLENOL) tablet 975 mg  975 mg Oral Q6H Ferrel Logan, [place]   975 mg at 09/27/23 0648  apixaban (ELIQUIS) tablet 5 mg  5 mg Oral Q12H Cho, [place], DO   5 mg at 09/27/23 0850  cefTAZIDime-avibactam (AVYCAZ) 2.5 g in sodium chloride 0.9% 50 mL (mini-bag plus)  2.5 g Intravenous Q8H Benedetto Coons, MD 25 mL/hr at 09/27/23 0600 Rate Verify at 09/27/23 0600  chlorhexidine gluconate (HIBICLENS/BETASEPT) 4 % topical liquid   Topical (Top) Daily Ferrel Logan, [place]   Given at 09/27/23 0855  famotidine (PEPCID) tablet 20 mg  20 mg Oral Daily Ferrel Logan, [place]   20 mg at 09/27/23 0850  ferrous gluconate (FERGON) tablet 324 mg  324 mg Oral Every Other Day Ferrel Logan, [place]   324 mg at 09/27/23 0850  folic acid (FOLVITE) tablet 1 mg  1 mg Oral Daily Ferrel Logan, PA   1 mg at 09/27/23 0850  gabapentin (NEURONTIN) capsule 400 mg  400 mg Oral BID WC Ferrel Logan, PA   400 mg at 09/27/23 0850  polyethylene glycol (MIRALAX) packet 17 g  17 g Oral Daily Ferrel Logan, [place]   17 g at 09/27/23 0849  povidone-iodine (PROFEND) 10 % nasal swab 1 Application  1 Application Nasal Q12H Ferrel Logan, PA   1 Application at 09/26/23 2108  rosuvastatin (CRESTOR) tablet 10 mg  10 mg Oral Nightly Ferrel Logan, [place]   10 mg at 09/26/23 2104  senna (SENOKOT) tablet 8.6 mg  1 tablet Oral Nightly Ferrel Logan, PA   8.6 mg at 09/26/23 2104  sodium chloride 0.9 % flush 10 mL  10 mL Intra-Catheter Q12H Ferrel Logan, PA   10 mL at 09/26/23 0856  sodium chloride 0.9 % flush 3 mL  3 mL IV Push Q8H Ferrel Logan, PA   3 mL at 09/27/23 2595 Review of Systems: Constitutional: Negative for chills and fever. HENT: Negative for rhinorrhea and sore throat.  Respiratory: Negative for cough and shortness of breath.  Cardiovascular: Negative for chest pain. Gastrointestinal: Negative for abdominal painObjective: Vitals:Temp:  [97.6 ?F (36.4 ?C)-98.7 ?F (37.1 ?C)] 98.7 ?F (37.1 ?C)Pulse:  [61-72] 61Resp:  [18-20] 20BP: (91-101)/(45-63) 97/55SpO2:  [94 %-99 %] 95 %Intake/Output:Gross Totals (Last 24 hours) at 09/27/2023 1057Last data filed at 09/27/2023 0900Intake 2784.7 ml Output 2025 ml Net 759.7 ml Physical Exam: Physical ExamVitals reviewed. Constitutional:     General: Not in acute distress.   Appearance: Normal appearance. HENT:    Head: Normocephalic and atraumatic.    Nose: Nose normal.    Mouth/Throat:    Pharynx: Oropharynx is clear. Eyes:    General: No scleral icterus.   Extraocular Movements: Extraocular movements intact. Cardiovascular:    Rate and Rhythm: Normal rate. Pulmonary:    Effort: Pulmonary effort is normal. No respiratory distress. Abdominal:    General: There is no distension.    Palpations: Abdomen is soft. Musculoskeletal:       Cervical back: Neck supple. Skin:   General: Skin is warm. Neurological:    Mental Status: Alert and oriented. Abd: s, nd, nontender.RLQ ileostomy functioning well with stool output.Epigastric transverse colon mucous fistula, healthy appearing mucosa, functioning, with white, mucous exudate.Labs: Recent Results (from the past 24 hour(s)) CBC auto differential  Collection Time: 09/27/23  7:39 AM Result Value Ref Range  WBC 20.6 (H) 4.0 - 11.0 x1000/?L  RBC 3.12 (L) 4.00 - 6.00 M/?L  Hemoglobin 8.8 (L) 11.7 - 15.5 g/dL  Hematocrit 16.10 (L) 96.04 - 45.00 %  MCV 89.4 80.0 - 100.0 fL  MCH 28.2 27.0 - 33.0 pg  MCHC 31.5 31.0 - 36.0 g/dL  RDW-CV 54.0 (H) 98.1 - 15.0 %  Platelets 307 150 - 420 x1000/?L  MPV 9.9 8.0 - 12.0 fL  Neutrophils 87.1 (H) 39.0 - 72.0 %  Lymphocytes 6.3 (L) 17.0 - 50.0 %  Monocytes 3.4 (L) 4.0 - 12.0 %  Eosinophils 1.7 0.0 - 5.0 %  Basophil 0.3 0.0 - 1.4 %  Immature Granulocytes 1.2 (H) 0.0 - 1.0 %  nRBC 0.0 0.0 - 1.0 %  Absolute Lymphocyte Count 1.29 0.60 - 3.70 x 1000/?L  Monocyte Absolute Count 0.71 0.00 - 1.00 x 1000/?L  Eosinophil Absolute Count 0.35 0.00 - 1.00 x 1000/?L  Basophil Absolute Count 0.07 0.00 - 1.00 x 1000/?L  Absolute Immature Granulocyte Count 0.24 0.00 - 0.30 x 1000/?L  Absolute nRBC 0.00 0.00 - 1.00 x 1000/?L  ANC (Abs Neutrophil Count) 17.93 (H) 2.00 - 7.60 x 1000/?L Comprehensive metabolic panel  Collection Time: 09/27/23  7:39 AM Result Value Ref Range  Sodium 150 (H) 136 - 145 mmol/L  Potassium 3.8 3.5 - 5.1 mmol/L  Chloride 126 (H) 95 - 115 mmol/L  CO2 23 21 - 32 mmol/L  Anion Gap 1 (L) 5 - 18  Glucose 81 70 - 100 mg/dL  BUN 11 8 - 25 mg/dL  Creatinine 1.91 4.78 - 1.30 mg/dL  Calcium 9.0 8.4 - 29.5 mg/dL  BUN/Creatinine Ratio 62.1 8.0 - 25.0  Total Protein 5.7 (L) 6.4 - 8.2 g/dL  Albumin 1.8 (L) 3.4 - 5.0 g/dL  Total Bilirubin 0.2 0.0 - 1.0 mg/dL  Alkaline Phosphatase 60 20 - 120 U/L  Alanine Aminotransferase (ALT) 11 (L) 12 - 78 U/L  Aspartate Aminotransferase (AST) 20 5 - 37 U/L  Globulin 3.9 g/dL  A/G Ratio 0.5   AST/ALT Ratio 1.8 Reference Range Not Established  Osmolality Calculation 296 (H) 275 - 295 mOsm/kg  eGFR (Creatinine) >60 >=60 mL/min/1.22m2 Diagnostic Review:10/4/24CT ABDOMEN PELVIS W IV CONTRAST:  09/25/2023 CLINICAL STATEMENT:  Sepsis status post stent placement. Ureteroscopy with white count of 46,000 COMPARISON:  09/14/2023 FINDINGS: 80 cc of IV Omni 350 was administered. [place] ABDOMEN:  Atelectasis is seen at both lung bases, right greater than left, with trace bilateral pleural effusions. A left ureteral stent is in place. The pigtail catheter has slightly retracted and now projects within the renal pelvis/proximal left ureter with interval increase in moderate left-sided hydronephrosis. There has been interval increase in extensive left perinephric stranding with fluid extending inferiorly within the perirenal space without focal abscess. An 8 mm low-attenuation lesion in the upper pole of the left kidney is unchanged. There are scattered 2 to 3 mm calculi within the left renal collecting system. No definite ureteral calculus is seen along the course of the stent. A nonobstructing 6 mm right upper pole calculus is again identified without right-sided hydronephrosis. Multiple gallstones are noted with a small amount of surrounding fluid likely due to a right-sided ascites. The liver, spleen, and pancreas are unremarkable. There is bilateral adrenal thickening. The aorta is nondilated. There is stable mild retroperitoneal adenopathy. The SMA and SMV are patent. Diffuse colonic diverticula are noted without [place] evidence for diverticulitis. 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 There are multiple sigmoid diverticula without [place] evidence for diverticulitis. A large ball of formed stool is seen in the rectum. There is air in the bladder likely due to instrumentation. No pelvic adenopathy is seen. There are degenerative changes of the spine and scoliosis without compression fracture. IMPRESSION:  1. Slight interval retraction of a left ureteral stent with the proximal pigtail now within the renal pelvis/proximal left ureter with increase in moderate left-sided hydronephrosis and left perinephric fluid. No focal perinephric abscess is identified.2. Constipation with a large ball of formed stool in the rectum without diverticulitis or bowel obstruction.3. Increased bibasilar atelectasis.4. Bilateral renal calculi. Findings were discussed with Dr. Hulen Luster at 3:10 PM Radiology Notify System Classification: Clinical team aware.  Individual dose optimization technique was used for the performance of this procedure, and one or more of the following dose reduction techniques was used: Automated exposure control and/or adjustment of the MA and/or kV according to patient size and/or the use of iterative reconstruction technique. Reported and signed by:  Geroge Baseman, MDImpression/Recommendations: S/p right hemicolectomy, w end ileostomy and transverse colon mucous fistula.Ileostomy functioning well.Mucous fistula also functioning well.-No surgical intervention indicated at this time.-Consider fleets enemas per rectum if dicomfort from constipation from rectal ball.-Rec referral to colorectal surgery at [facility] after recovery from sepsis/discharge home.-Please reconsult if additional questions.Patient was evaluated: In person, at bedside.Electronically Signed: Francee Nodal, MDFor questions please page: 920010/05/2023 10:57 AM

## 2023-09-27 NOTE — Progress Notes
 [facility]	Infectious Disease Progress NoteAttending Provider: Lyndee Leo, DO Subjective Subjective: Interim History:  No acute events overnight.  In good spirits this morning.  Feels much better today No complaints.D13 total abx: D2 Avycaz after 11 days of ertapenem and 1-2 doses of Zosyn and vancomycinAfebrile PCT-113.78 (peak-161.27 on 10/04)CRP-8.1 (peak-15.5 on 10/4)ESR-60 on 10/4WBC-20.6  (peak-46.6 on 10/4).Micro:Urine culture (9/23-clean-catch):  ESBL E coliUrine culture (9/30-straight cath): No growthOR urine culture (10/3-bladder):  No growthBlood culture (9/23):  ESBL E coliBlood culture (9/29):  No growthBlood culture (10/4):  No growth to dateC diff (10/4): NegativeReview of Allergies/Meds/Hx: I have reviewed the patient's: allergies, current scheduled medications, current infusions, current prn medications, past medical history, past surgical history, family history, social history, and prior to admission medicationsScheduled Meds:Current Facility-Administered Medications Medication Dose Route Frequency Provider Last Rate Last Admin  acetaminophen (TYLENOL) tablet 975 mg  975 mg Oral Q6H Ferrel Logan, PA   975 mg at 09/27/23 0648  apixaban (ELIQUIS) tablet 5 mg  5 mg Oral Q12H Cho, Eunna, DO   5 mg at 09/27/23 0850  cefTAZIDime-avibactam (AVYCAZ) 2.5 g in sodium chloride 0.9% 50 mL (mini-bag plus)  2.5 g Intravenous Q8H Benedetto Coons, MD 25 mL/hr at 09/27/23 0600 Rate Verify at 09/27/23 0600  chlorhexidine gluconate (HIBICLENS/BETASEPT) 4 % topical liquid   Topical (Top) Daily Ferrel Logan, [place]   Given at 09/27/23 0855  famotidine (PEPCID) tablet 20 mg  20 mg Oral Daily Ferrel Logan, [place]   20 mg at 09/27/23 0850  ferrous gluconate (FERGON) tablet 324 mg  324 mg Oral Every Other Day Ferrel Logan, PA   324 mg at 09/27/23 0850  folic acid (FOLVITE) tablet 1 mg  1 mg Oral Daily Ferrel Logan, PA   1 mg at 09/27/23 0850 gabapentin (NEURONTIN) capsule 400 mg  400 mg Oral BID WC Ferrel Logan, PA   400 mg at 09/27/23 0850  polyethylene glycol (MIRALAX) packet 17 g  17 g Oral Daily Ferrel Logan, [place]   17 g at 09/27/23 0849  povidone-iodine (PROFEND) 10 % nasal swab 1 Application  1 Application Nasal Q12H Ferrel Logan, PA   1 Application at 09/26/23 2108  rosuvastatin (CRESTOR) tablet 10 mg  10 mg Oral Nightly Ferrel Logan, [place]   10 mg at 09/26/23 2104  senna (SENOKOT) tablet 8.6 mg  1 tablet Oral Nightly Ferrel Logan, PA   8.6 mg at 09/26/23 2104  sodium chloride 0.9 % flush 10 mL  10 mL Intra-Catheter Q12H Ferrel Logan, PA   10 mL at 09/26/23 0856  sodium chloride 0.9 % flush 3 mL  3 mL IV Push Q8H Ferrel Logan, PA   3 mL at 09/27/23 0648 Continuous Infusions: dextrose 5 % in water (D5W) 50 mL/hr (09/27/23 1011)  sodium chloride 10 mL/hr (09/26/23 1835) PRN Meds:.acetaminophen, ondansetron **OR** ondansetron (PF), oxyCODONE, oxyCODONE, prochlorperazine **OR** prochlorperazine edisylate, sodium chloride, sodium chloride, sodium chloride Objective Objective: Vitals:I have reviewed the patient's current vital signs as documented in the patient's EMR.  , Last 24 hours: Temp:  [97.6 ?F (36.4 ?C)-98.7 ?F (37.1 ?C)] 98.3 ?F (36.8 ?C)Pulse:  [61-83] 83Resp:  [18-20] 20BP: (94-101)/(55-63) 94/56SpO2:  [94 %-99 %] 99 %, and O2 Sat and Device used: SpO2: 99 %Device (Oxygen Therapy): room airI/O's:I have reviewed the patient's current I&O's as documented in the EMR.Gross Totals (Last 24 hours) at 09/27/2023 1213Last data filed at 09/27/2023 0900Intake 2784.7 ml Output 2025 ml Net 759.7 ml  Physical ExamConstitutional:     General: She is not in  acute distress.   Appearance: She is ill-appearing. She is not toxic-appearing or diaphoretic. HENT:    Head: Normocephalic and atraumatic.    Nose: No congestion or rhinorrhea.    Mouth/Throat:    Mouth: Mucous membranes are dry.    Pharynx: Oropharynx is clear. No oropharyngeal exudate or posterior oropharyngeal erythema.    Comments: Poor dentition.  Missing multiple lower teethEyes:    General: No scleral icterus.      Right eye: No discharge.       Left eye: No discharge.    Conjunctiva/sclera: Conjunctivae normal.    Pupils: Pupils are equal, round, and reactive to light. Cardiovascular:    Rate and Rhythm: Normal rate and regular rhythm.    Heart sounds: No murmur heard.   No friction rub. No gallop. Pulmonary:    Effort: No respiratory distress.    Breath sounds: No wheezing, rhonchi or rales.    Comments: Decreased breath sounds at basesAbdominal:    General: There is no distension.    Palpations: Abdomen is soft.    Tenderness: There is no abdominal tenderness. There is no right CVA tenderness, left CVA tenderness, guarding or rebound.    Comments: Ileostomy in place.  Epigastric mucus fistula with mucus exudate Genitourinary:   Comments: External urinary catheter in placeMusculoskeletal:       General: No swelling or tenderness.    Cervical back: Neck supple. No rigidity or tenderness.    Right lower leg: No edema.    Left lower leg: No edema. Lymphadenopathy:    Cervical: No cervical adenopathy. Skin:   General: Skin is warm and dry.    Coloration: Skin is not jaundiced or pale.    Findings: No bruising, erythema, lesion or rash.    Comments: RUE midline without signs of infection Neurological:    General: No focal deficit present.    Mental Status: She is alert and oriented to person, place, and time.    Cranial Nerves: No cranial nerve deficit.    Sensory: No sensory deficit.    Motor: No weakness. Psychiatric:       Mood and Affect: Mood normal. Labs:I have reviewed the patient's labs within the last 24 hrs.Last 24 hours: Recent Results (from the past 24 hour(s)) CBC auto differential  Collection Time: 09/27/23  7:39 AM Result Value Ref Range  WBC 20.6 (H) 4.0 - 11.0 x1000/?L  RBC 3.12 (L) 4.00 - 6.00 M/?L  Hemoglobin 8.8 (L) 11.7 - 15.5 g/dL  Hematocrit 16.10 (L) 96.04 - 45.00 %  MCV 89.4 80.0 - 100.0 fL  MCH 28.2 27.0 - 33.0 pg  MCHC 31.5 31.0 - 36.0 g/dL  RDW-CV 54.0 (H) 98.1 - 15.0 %  Platelets 307 150 - 420 x1000/?L  MPV 9.9 8.0 - 12.0 fL  Neutrophils 87.1 (H) 39.0 - 72.0 %  Lymphocytes 6.3 (L) 17.0 - 50.0 %  Monocytes 3.4 (L) 4.0 - 12.0 %  Eosinophils 1.7 0.0 - 5.0 %  Basophil 0.3 0.0 - 1.4 %  Immature Granulocytes 1.2 (H) 0.0 - 1.0 %  nRBC 0.0 0.0 - 1.0 %  Absolute Lymphocyte Count 1.29 0.60 - 3.70 x 1000/?L  Monocyte Absolute Count 0.71 0.00 - 1.00 x 1000/?L  Eosinophil Absolute Count 0.35 0.00 - 1.00 x 1000/?L  Basophil Absolute Count 0.07 0.00 - 1.00 x 1000/?L  Absolute Immature Granulocyte Count 0.24 0.00 - 0.30 x 1000/?L  Absolute nRBC 0.00 0.00 - 1.00 x 1000/?L  ANC (Abs Neutrophil Count) 17.93 (H) 2.00 - 7.60  x 1000/?L Comprehensive metabolic panel  Collection Time: 09/27/23  7:39 AM Result Value Ref Range  Sodium 150 (H) 136 - 145 mmol/L  Potassium 3.8 3.5 - 5.1 mmol/L  Chloride 126 (H) 95 - 115 mmol/L  CO2 23 21 - 32 mmol/L  Anion Gap 1 (L) 5 - 18  Glucose 81 70 - 100 mg/dL  BUN 11 8 - 25 mg/dL  Creatinine 7.82 9.56 - 1.30 mg/dL  Calcium 9.0 8.4 - 21.3 mg/dL  BUN/Creatinine Ratio 08.6 8.0 - 25.0  Total Protein 5.7 (L) 6.4 - 8.2 g/dL  Albumin 1.8 (L) 3.4 - 5.0 g/dL  Total Bilirubin 0.2 0.0 - 1.0 mg/dL  Alkaline Phosphatase 60 20 - 120 U/L  Alanine Aminotransferase (ALT) 11 (L) 12 - 78 U/L  Aspartate Aminotransferase (AST) 20 5 - 37 U/L  Globulin 3.9 g/dL  A/G Ratio 0.5   AST/ALT Ratio 1.8 Reference Range Not Established  Osmolality Calculation 296 (H) 275 - 295 mOsm/kg  eGFR (Creatinine) >60 >=60 mL/min/1.1m2 C-reactive protein (CRP)  Collection Time: 09/27/23  7:39 AM Result Value Ref Range  C-Reactive Protein 8.1 (H) 0.0 - 1.0 mg/dL Diagnostics:No new radiology.ECG/Tele Events: I have reviewed the patient's ECG as resulted in the EMR. 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  Please see my consult note from 10/04 for details regarding presentation and infectious history.  Found to have ESBL E coli bacteremia from urine source.  Clinically responded to ertapenem. Dr. Hulen Luster performed cystoscopy with left ureteral stent replacement and removal of stone.  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  Markedly elevated WBC and PCT of the next day which was reason for ID consultation.  Suspect stone was colonized and likely transiently seeded during stone removal.  [place] scan on 10/04 showed slight retraction of left ureteral stent with interval increase in moderate left-sided hydronephrosis and left perinephric fluid, no focal perinephric abscess, and bilateral renal stones.  No evidence of ureteral injury on [place] scan.  No growth from OR bladder urine culture.  RRT called on the evening of 10/4 for hypotension; responded to IVFs and broadened to ceftazidime-avibactam.  Hemodynamics improved the next day and no growth from blood cultures at 48 hours, so narrowed back to ertapenem on 10/06. #Leukocytosis, improving.  Most likely transiently seeded during stone removal.  No growth from blood cultures thus far.  Remains afebrile and hemodynamics improved.  Can narrow regimen today#Elevated inflammatory markers, improving#ESBL E coli bacteremia #Complicated UTI Plan Plan: -maintain contact precautions for ESBL pathogen-follow up blood cultures-trend inflammatory markers/cell counts-stop ceftazidime-avibactam-restart ertapenem 1gm IV daily (I ordered)-final duration dependent on clinical courseDiscussed with Dr. Donzetta Sprung. Marcello Moores will be taking over the inpatient Infectious Diseases service tomorrow Chancy Milroy, M.D.Infectious Diseases Pager: [phone]: [phone]: [phone]

## 2023-09-27 NOTE — Plan of Care
 The pt. transferred to Medicine from tele step down, vss, ra, iv fluid running, ileostomy intact and in use, colostomy old and not in use/bandaged over, purewick in place, bedrest, a&ox4, no c/o pain, pt. resting comfortably in bed. Problem: Adult Inpatient Plan of CareGoal: Plan of Care ReviewOutcome: Interventions implemented as appropriateGoal: Absence of Hospital-Acquired Illness or InjuryOutcome: Interventions implemented as appropriateGoal: Optimal Comfort and WellbeingOutcome: Interventions implemented as appropriate Problem: Fall Injury RiskGoal: Absence of Fall and Fall-Related InjuryOutcome: Interventions implemented as appropriate Problem: Wound Healing ProgressionGoal: Optimal Wound HealingOutcome: Interventions implemented as appropriate Problem: Skin Injury Risk IncreasedGoal: Skin Health and IntegrityOutcome: Interventions implemented as appropriate Problem: InfectionGoal: Absence of Infection Signs and SymptomsOutcome: Interventions implemented as appropriate Plan of Care Overview/ Patient Status

## 2023-09-27 NOTE — Progress Notes
 Feels well, no complaints.  Voiding via purwick- clear yellow urine.    Lab Results Component Value Date  WBC 20.6 (H) 09/27/2023  HGB 8.8 (L) 09/27/2023  PLT 307 09/27/2023  MCV 89.4 09/27/2023 Results in Past 7 DaysResult Component Current Result BUN 11 (09/27/2023) Chloride 126 (H) (09/27/2023) CO2 23 (09/27/2023) Creatinine 0.93 (09/27/2023) Glucose 81 (09/27/2023) Potassium 3.8 (09/27/2023) Sodium 149 (H) (09/27/2023) Blood pressure (!) 82/52, pulse 76, temperature 97.7 ?F (36.5 ?C), temperature source Oral, resp. rate 18, height 5' 5 (1.651 m), weight 57.9 kg, SpO2 98 %.AAO, NAD, pleasantUnlabored breathingAbd soft, NT/ND, no SPPExt: WWP x4, no edema67F admitted 9/23 with sepsis and pyelonephritis (ESBL, E coli) who is now POD#3 s/p cysto, left ureteroscopy, laser lithotripsy, stone extraction and stent exchange.  WBC improving (46.6-->40.4-->39.7-->37-->20.6).  CRP down, 8-Abx per ID-->stopped ceftazidime + avibactam and restarted ertapenem today -f/u cultures -SCDs & Eliquis per primary team for h/o DVT-encourage IS and OOB, ambulation-f/u AM labs-d/w Dr. Lance Morin P. Marykathryn Carboni, PA-CPgr [phone]

## 2023-09-27 NOTE — Plan of Care
 Plan of Care Overview/ Patient Status    Patient Aox4, room air, vitals as charted. NSR on monitor, HR 60s-70s. Bps 90s/50s-60s, map>65. Colostomy emptied, purewick in place. LR running at 100 mL/hr through midline. Bed alarm on, call bell in reach, safety maintained. Problem: Diabetes ComorbidityGoal: Blood Glucose Level Within Targeted RangeOutcome: Interventions implemented as appropriate Problem: Violence Risk or ActualGoal: Anger and Impulse ControlOutcome: Interventions implemented as appropriate Problem: Adult Inpatient Plan of CareGoal: Plan of Care ReviewOutcome: Interventions implemented as appropriateGoal: Patient-Specific Goal (Individualized)Outcome: Interventions implemented as appropriateGoal: Absence of Hospital-Acquired Illness or InjuryOutcome: Interventions implemented as appropriateGoal: Optimal Comfort and WellbeingOutcome: Interventions implemented as appropriateGoal: Readiness for Transition of CareOutcome: Interventions implemented as appropriate Problem: Fall Injury RiskGoal: Absence of Fall and Fall-Related InjuryOutcome: Interventions implemented as appropriate Problem: Wound Healing ProgressionGoal: Optimal Wound HealingOutcome: Interventions implemented as appropriate Problem: Skin Injury Risk IncreasedGoal: Skin Health and IntegrityOutcome: Interventions implemented as appropriate Problem: InfectionGoal: Absence of Infection Signs and SymptomsOutcome: Interventions implemented as appropriate Problem: Physical Therapy GoalsGoal: Physical Therapy GoalsDescription: 1) bed mob w/ minx12) txfrs w/ RW mod a x2Outcome: Interventions implemented as appropriate

## 2023-09-28 LAB — CBC WITH AUTO DIFFERENTIAL
BKR WAM ABSOLUTE IMMATURE GRANULOCYTES.: 0.05 x 1000/ÂµL (ref 0.00–0.30)
BKR WAM ABSOLUTE LYMPHOCYTE COUNT.: 1.58 x 1000/ÂµL (ref 0.60–3.70)
BKR WAM ABSOLUTE NRBC (2 DEC): 0 x 1000/ÂµL (ref 0.00–1.00)
BKR WAM ANC (ABSOLUTE NEUTROPHIL COUNT): 7.23 x 1000/ÂµL (ref 2.00–7.60)
BKR WAM BASOPHIL ABSOLUTE COUNT.: 0.05 x 1000/ÂµL (ref 0.00–1.00)
BKR WAM BASOPHILS: 0.5 % (ref 0.0–1.4)
BKR WAM EOSINOPHIL ABSOLUTE COUNT.: 0.43 x 1000/ÂµL (ref 0.00–1.00)
BKR WAM EOSINOPHILS: 4.3 % (ref 0.0–5.0)
BKR WAM HEMATOCRIT (2 DEC): 28.7 % — ABNORMAL LOW (ref 35.00–45.00)
BKR WAM HEMOGLOBIN: 8.9 g/dL — ABNORMAL LOW (ref 11.7–15.5)
BKR WAM IMMATURE GRANULOCYTES: 0.5 % (ref 0.0–1.0)
BKR WAM LYMPHOCYTES: 15.8 % — ABNORMAL LOW (ref 17.0–50.0)
BKR WAM MCH (PG): 27.6 pg (ref 27.0–33.0)
BKR WAM MCHC: 31 g/dL (ref 31.0–36.0)
BKR WAM MCV: 89.1 fL (ref 80.0–100.0)
BKR WAM MONOCYTE ABSOLUTE COUNT.: 0.65 x 1000/ÂµL (ref 0.00–1.00)
BKR WAM MONOCYTES: 6.5 % (ref 4.0–12.0)
BKR WAM MPV: 10.5 fL (ref 8.0–12.0)
BKR WAM NEUTROPHILS: 72.4 % — ABNORMAL HIGH (ref 39.0–72.0)
BKR WAM NUCLEATED RED BLOOD CELLS: 0 % (ref 0.0–1.0)
BKR WAM PLATELETS: 301 x1000/ÂµL (ref 150–420)
BKR WAM RDW-CV: 17.3 % — ABNORMAL HIGH (ref 11.0–15.0)
BKR WAM RED BLOOD CELL COUNT.: 3.22 M/ÂµL — ABNORMAL LOW (ref 4.00–6.00)
BKR WAM WHITE BLOOD CELL COUNT: 10 x1000/ÂµL (ref 4.0–11.0)

## 2023-09-28 LAB — COMPREHENSIVE METABOLIC PANEL
BKR A/G RATIO: 0.4
BKR ALANINE AMINOTRANSFERASE (ALT): 11 U/L — ABNORMAL LOW (ref 12–78)
BKR ALBUMIN: 1.7 g/dL — ABNORMAL LOW (ref 3.4–5.0)
BKR ALKALINE PHOSPHATASE: 67 U/L (ref 20–120)
BKR ANION GAP: 6 (ref 5–18)
BKR ASPARTATE AMINOTRANSFERASE (AST): 15 U/L (ref 5–37)
BKR AST/ALT RATIO: 1.4
BKR BILIRUBIN TOTAL: 0.2 mg/dL (ref 0.0–1.0)
BKR BLOOD UREA NITROGEN: 9 mg/dL (ref 8–25)
BKR BUN / CREAT RATIO: 13.2 (ref 8.0–25.0)
BKR CALCIUM: 9.1 mg/dL (ref 8.4–10.3)
BKR CHLORIDE: 120 mmol/L — ABNORMAL HIGH (ref 95–115)
BKR CO2: 19 mmol/L — ABNORMAL LOW (ref 21–32)
BKR CREATININE: 0.68 mg/dL (ref 0.50–1.30)
BKR EGFR, CREATININE (CKD-EPI 2021): >60 mL/min/{1.73_m2} (ref >=60)
BKR GLOBULIN: 4.3 g/dL
BKR GLUCOSE: 65 mg/dL — ABNORMAL LOW (ref 70–100)
BKR OSMOLALITY CALCULATION: 286 mosm/kg (ref 275–295)
BKR POTASSIUM: 3.7 mmol/L (ref 3.5–5.1)
BKR PROTEIN TOTAL: 6 g/dL — ABNORMAL LOW (ref 6.4–8.2)
BKR SODIUM: 145 mmol/L (ref 136–145)

## 2023-09-28 LAB — C-REACTIVE PROTEIN     (CRP): BKR C-REACTIVE PROTEIN: 4.7 mg/dL — ABNORMAL HIGH (ref 0.0–1.0)

## 2023-09-28 LAB — PROCALCITONIN     (BH GH LMW Q YH): BKR PROCALCITONIN: 37.11 ng/mL — ABNORMAL HIGH

## 2023-09-28 LAB — SODIUM: BKR SODIUM: 147 mmol/L — ABNORMAL HIGH (ref 136–145)

## 2023-09-28 MED ORDER — FRUIT JUICE
ORAL | Status: AC | PRN
Start: 2023-09-28 — End: 2023-09-30

## 2023-09-28 MED ORDER — DEXTROSE 15 GRAM/60 ML ORAL LIQUID
1560 gram/60 mL | ORAL | Status: AC | PRN
Start: 2023-09-28 — End: 2023-09-30

## 2023-09-28 MED ORDER — SKIM MILK
ORAL | Status: AC | PRN
Start: 2023-09-28 — End: 2023-09-30

## 2023-09-28 MED ORDER — GLUCAGON 1 MG/ML IN STERILE WATER
Freq: Once | INTRAMUSCULAR | Status: AC | PRN
Start: 2023-09-28 — End: 2023-10-01

## 2023-09-28 MED ORDER — DEXTROSE 10 % IV BOLUS FOR ORDERABLE
INTRAVENOUS | Status: AC | PRN
Start: 2023-09-28 — End: 2023-09-30

## 2023-09-28 NOTE — Plan of Care
 Inpatient Physical Therapy Re-Evaluation HPI/Precautions - 09/28/23 1052    Date of Visit / Treatment  Date of Visit / Treatment 09/28/23   Note Type Re-Evaluation   Progress Report Due 10/02/23   Start Time 10:00am   End Time 10:45am   Total Treatment Time    General Information  Pertinent History Of Current Problem 67 y/o female admitted on 9/23 with sepsis and pyelonephritis (EBSL, E. Coli) who is now 4 Days Post-Op S/P cystoscopy, left ureteroscopy, laser lithotripsy, stone extraction and left ureteral stent exchange.   Subjective I feel better, I want to try and get up.   Referring Physician  Dr Tresa Endo   General Observations Supine in bed all lines in place   Precautions/Limitations Fall Precautions;Bed alarm;Chair alarm   Precautions/Limitations Comment PIC line   Hand Dominance right      Vitals/Pain - 09/28/23 1052    Vital Signs and Orthostatic Vital Signs  Vital Signs Vital Signs Stable    Pain/Comfort  Posttreatment Rating (Numbers Scale) 0/10 - no pain      Balance - 09/28/23 1052    Balance  Sitting Balance: Static  FAIR-      Contact Guard to maintain static position with no Assistive Device   Sitting Balance: Dynamic  FAIR-     Performs dynamic activities through partial range (50-75%) with Contact Guard   Standing Balance: Static POOR-     Maximal assist to maintain static position with no Assistive Device   Standing Balance: Dynamic  POOR-    Unable to move from midline voluntarily, maximal assist to right self   Balance Assist Device [place] 3000      Mobility - 09/28/23 1052    Bed Mobility  Symptoms Noted During/After Treatment none   Rolling/Turning Right - Independence/Assistance Level Minimum assist;Set-up required;Verbal cues;Assist of 1   Rolling/Turning Left - Independence/Assistance Level Minimum assist;Set-up required;Verbal cues;Assist of 1 Rolling/Turning Assist Device Bed rails;Hand held assist;Head of bed elevated   Supine-to-Sit Independence/Assistance Level Moderate assist;Set-up required;Verbal cues;Assist of 2   Supine-to-Sit Assist Device Bed rails;Hand held assist;Head of bed elevated   Sit-to-Supine Independence/Assistance Level Moderate assist;Assist of 2;Set-up required   Sit-to-Supine Assist Device Bed rails;Hand held assist;Head of bed elevated   Limitations decreased ability to use legs for bridging/pushing;impaired ability to control trunk for mobility   Bed Mobility, Impairments decreased flexibility;strength decreased    Sit-Stand Transfer Training  Symptoms Noted During/After Treatment (Sit-to-Stand Transfer Training) fatigue   Sit-to-Stand Transfer Independence/Assistance Level Total assist/dependent;Verbal cues;Set-up required;Assist of 2   Sit-to-Stand Transfer Assist Device [place] 3000   Stand-to-Sit Transfer Independence/Assistance Level Total assist/dependent;Verbal cues;Set-up required;Assist of 2   Stand-to-Sit Transfer Assist Device [place] 3000   Transfer Safety Analysis Concerns decreased sequencing ability;losing balance backward;decreased balance during turns;decreased weight-shifting ability   Transfer Safety Analysis Impairments decreased strength    Gait Training  Independence/Assistance Level  Unable to perform      PT Handoff - 09/28/23 1052    Handoff Documentation  Handoff Patient in bed;Bed alarm   Handoff Comments RN: Benetta Spar in attendance      AMPAC Basic Mobility - 09/28/23 1052    PT- AM-PAC - Basic Mobility Screen- How much help from another person do you currently need.....  Turning from your back to your side while in a a flat bed without using rails? 3 - A Little - Requires a little help (supervision, minimal assistance). Can use assistive devices.   Moving from lying on your back to sitting  on the side of a flat bed without using bed rails? 2 - A Lot - Requires a lot of help (maximum to moderate assistance). Can use assistive devices.   Moving to and from a bed to a chair (including a wheelchair)? 1 - Total - Requires total assistance or cannot do it at all.   Standing up from a chair using your arms(e.g., wheelchair or bedside chair)? 1 - Total - Requires total assistance or cannot do it at all.   To walk in a hospital room? 1 - Total - Requires total assistance or cannot do it at all.   Climbing 3-5 steps with a railing? 1 - Total - Requires total assistance or cannot do it at all.   AMPAC Mobility Score 9   TARGET Highest Level of Mobility Mobility Level 3, Sit on edge of bed   ACTUAL Highest Level of Mobility Mobility Level 5, Stand for 1 minute      Therapeutic Exercise - 09/28/23 1052    Therapeutic Exercise  Therapeutic Exercise Detailed Documentation Upper Extremity Exercises;Lower Extremity Exercises    Upper Extremity Exercises  Shoulder Flexion;bilateral;Active range of motion   Elbow Flexion;Extension;bilateral;Active range of motion    Lower Extremity Exercises  Sitting Exercises Long arc quads;AROM;Bilateral   Supine Exercises Heelslides;Hip abduction;Hip adduction;Straight leg raise;Quad sets;Glut sets;Bridging;Bilateral;AROM;AAROM      Clinical Impression - 09/28/23 1052    Clinical Impression  Rehab Diagnosis Impaired mobility   Follow up Assessment 4 Days Post-Op S/P cystoscopy, left ureteroscopy, laser lithotripsy, stone extraction and left ureteral stent exchange, Now w/decreased sttrengtgh and fubctional mobility. Will need Rehab services to improve strength and functional mobility   Prognosis Good   Criteria for Skilled Therapeutic Interventions Met yes;treatment indicated   Pathology/Pathophysiology Noted (Describe Specifically for Each System) musculoskeletal   Impairments Found (describe specific impairments) aerobic capacity/endurance;muscle strength;mobility;balance   Functional Limitations in Following Categories (Describe Specific Limitations) self-care;home management;mobility;community/leisure   Disability: Inability to Perform Actions/Activities of Required Roles (describe specific disability) community/leisure   Rehab Potential good, to achieve stated therapy goals   Demonstrates Need for Referral to Another Service occupational therapy      Patient/Family Stated Goals - 09/28/23 1052    Patient/Family Stated Goals  Patient/Family Stated Goal(s) return home;return to prior level of functioning;get stronger;feel better      Frequency/Equipment Recommendations - 09/28/23 1052    Frequency/Equipment Recommendations  PT Frequency 3x per week   Weekend Priority No weekend session   Intensive Rehab Frequency at least 5 days/week for: 30 minutes per day   Equipment Needs During Admission/Treatment Other (comment)    [place] [address]     Recommendations for IP Admission - 09/28/23 1052    PT Recommendations for Inpatient Admission  Activity/Level of Assist out of bed;transfers only;assist of 2;in room;mechanical lift   ADL Recommendations bedside commode;assist of 2;mechanical lift   Other/Comments OOB<.> Chair. daily mechanical lift ax2.      EDU - Learning Assessment - 09/28/23 1052    Education - Learning Assessment   Education Topic Functional mobility techniques;Safety;Uses of devices/equipment   Learners Patient   Readiness Eager;Acceptance   Method Explanation;Demonstration   Response Verbalizes Understanding;Demonstrated Understanding      Planned Treatment/Interventions - 09/28/23 1052    Planned Treatment / Interventions  Plan for Next Visit Progressive mobilirty.   General Treatment / Interventions Activities of Daily Living   Training Treatment / Interventions Balance / Gait Training   Education Treatment / Interventions Patient Education / Training  Discharge Summary - 09/28/23 1052    PT Discharge Summary  Physical Therapy Disposition Recommendation Moderate complexity support and therapy to progress functional mobility/ ADLs/ IADLs recommended for post- acute care.  See assessment for additional details.   Patient assessed for rehabilitation services? Yes     Problem: Physical Therapy GoalsGoal: Physical Therapy GoalsDescription: 1) bed mob w/ minx12) txfrs w/ RW mod a x2Outcome: Interventions implemented as appropriate

## 2023-09-28 NOTE — Progress Notes
 [facility]	Surgery Progress NoteAttending Provider: Hinton Rao, DO Post-Operative Day: 4 Days Post-Op S/P cystoscopy, left ureteroscopy, laser lithotripsy, stone extraction and left ureteral stent exchangeHospital LOS: 13 days Subjective: Interim History: Patient seen and examined. No significant events overnight. Patient comfortable in bed, she feels well with no complaints. Voiding concentrated, yellow urine via purwick.Objective: Vitals:I have reviewed the patient's current vital signs as documented in the patient's EMR.   and Last 24 hours: Temp:  [97.7 ?F (36.5 ?C)-98.8 ?F (37.1 ?C)] 98.1 ?F (36.7 ?C)Pulse:  [60-83] 61Resp:  [16-20] 18BP: (82-117)/(52-69) 117/69SpO2:  [98 %-99 %] 99 %Physical ExamGen: AAOx 3, NADBreathing comfortably, unlaboredAbd: soft, nondistended, mild incisional pain, no guardingVoiding concentrated, yellow urine with purwick in placeLabs:Complete Blood CountResults in Past 7 DaysResult Component Current Result Hematocrit 28.70 (L) (09/28/2023) Hemoglobin 8.9 (L) (09/28/2023) MCH 27.6 (09/28/2023) MCHC 31.0 (09/28/2023) MCV 89.1 (09/28/2023) MPV 10.5 (09/28/2023) Platelets 301 (09/28/2023) RBC 3.22 (L) (09/28/2023) WBC 10.0 (09/28/2023) Basic Metabolic PanelResults in Past 7 DaysResult Component Current Result BUN 11 (09/27/2023) Calcium 9.0 (09/27/2023) Chloride 126 (H) (09/27/2023) CO2 23 (09/27/2023) Creatinine 0.93 (09/27/2023) Glucose 81 (09/27/2023) Potassium 3.8 (09/27/2023) Sodium 147 (H) (09/27/2023) Assessment: Patient is a 67 y/o female admitted on 9/23 with sepsis and pyelonephritis (EBSL, E. Coli) who is now 4 Days Post-Op S/P cystoscopy, left ureteroscopy, laser lithotripsy, stone extraction and left ureteral stent exchange. WBC is now normal (10, down from 20.6 yesterday). She remains urologically stable.Plan: - VTE ppx: Eliquis per primary team, Venodyne boots - Encourage IS, OOB to chair, ambulation- Continue antibiotics per ID - currently on ertapenem (started 10/6)- Regular diet- Patient remains urologically stable, discharge once medically stable and abx finalized- D/W Dr. Hulen Luster - he will see the patient in follow-up once she is discharged- Call with any questions or concerns, will sign-off for nowSigned:Marillyn Goren, PA-C

## 2023-09-28 NOTE — Plan of Care
 Plan of Care Overview/ Patient Status    Assumed care for the pt 19:00; pt A&O x4; vss on room air; pt denies pain/discomfort; RTM+SpO2 on; no s/sx of cardiac or respiratory distress; pure wick in place; urine concentrated; PO fluids encouraged; double lumen midline to RUE with dressing CDI; IVF infusing continuously as well KVO; ileostomy active with puree consistency dark brown & greenish stool; colostomy dressing CDI; abdomen soft; non-distended; denies GI distress; pt bedrest this shift; educated on safety; encouraged to call for assistance as needed; call light within reach; contact isolation precautions maintained; Problem: Adult Inpatient Plan of CareGoal: Plan of Care ReviewOutcome: Interventions implemented as appropriateGoal: Patient-Specific Goal (Individualized)Outcome: Interventions implemented as appropriateGoal: Absence of Hospital-Acquired Illness or InjuryOutcome: Interventions implemented as appropriateGoal: Optimal Comfort and WellbeingOutcome: Interventions implemented as appropriateGoal: Readiness for Transition of CareOutcome: Interventions implemented as appropriate Problem: Diabetes ComorbidityGoal: Blood Glucose Level Within Targeted RangeOutcome: Interventions implemented as appropriate Problem: Diabetes ComorbidityGoal: Blood Glucose Level Within Targeted RangeOutcome: Interventions implemented as appropriate Problem: Wound Healing ProgressionGoal: Optimal Wound HealingOutcome: Interventions implemented as appropriate Problem: InfectionGoal: Absence of Infection Signs and SymptomsOutcome: Interventions implemented as appropriate Problem: Skin Injury Risk IncreasedGoal: Skin Health and IntegrityOutcome: Interventions implemented as appropriate Problem: Wound Healing ProgressionGoal: Optimal Wound HealingOutcome: Interventions implemented as appropriate Problem: Physical Therapy GoalsGoal: Physical Therapy GoalsDescription: 1) bed mob w/ minx12) txfrs w/ RW mod a x2Outcome: Interventions implemented as appropriate

## 2023-09-28 NOTE — Plan of Care
 Plan of Care Overview/ Patient Status    Nutrition Follow up Assessment:Chart reviewed, events noted.Remote RD follow up per protocol.Attempted phone call to pt room x 2, unable to reach, spoke with RN who reports pt likes the SB ensure and consuming 50-100% of meals as reported. Skin: PI stg 1 to Buttocks noted Diet/supplement intake: regular diet 50-100% PO intake reported Bowels/GI: BM 10/6 Weight: no new weights Wt: 09/26/23 57.9 kg 07/16/23 58 kg 07/03/23 63.4 kg 07/01/23 63.4 kg 06/15/23 59.4 kg 05/24/23 62.2 kg Medications include: Current Facility-Administered Medications Medication  acetaminophen  apixaban  chlorhexidine gluconate  ertapenem  famotidine  ferrous gluconate  folic acid  gabapentin  polyethylene glycol  povidone-iodine  rosuvastatin  senna  sodium chloride  sodium chloride Nutrition Related Labs include:   Chemistry  Lab Results Component Value Date  NA 145 09/28/2023  K 3.7 09/28/2023  CL 120 (H) 09/28/2023  CO2 19 (L) 09/28/2023  BUN 9 09/28/2023  CREATININE 0.68 09/28/2023  GLU 84 09/28/2023  Lab Results Component Value Date  CALCIUM 9.1 09/28/2023  ALKPHOS 67 09/28/2023  AST 15 09/28/2023  ALT 11 (L) 09/28/2023  BILITOT 0.2 09/28/2023   Results in Past 7 DaysResult Component Current Result Hematocrit 28.70 (L) (09/28/2023) Hemoglobin 8.9 (L) (09/28/2023) MCH 27.6 (09/28/2023) MCHC 31.0 (09/28/2023) MCV 89.1 (09/28/2023) MPV 10.5 (09/28/2023) Platelets 301 (09/28/2023) RBC 3.22 (L) (09/28/2023) WBC 10.0 (09/28/2023) Lab Results Component Value Date  HGBA1C 5.5 03/09/2023  Nutritional AssessmentTimepoint: ReassessmentReason For Assessment: per organizational policy Patient Reported Diet/Restrictions/Preferences: regularCurrent StateCurrent Appetite: fairSpecialty Diet/Nutrition Received: regularDiet/Feeding Assistance: tray set-upDiet/Feeding Tolerance: goodNutrition Interventions: meal setup providedNutrition Risk Screen: reduced oral intake over the last monthNutrition Focused Physical FindingsOverall Physical Appearance: loss of muscle massNutrition OrderNutrition Order: meets nutritional requirementsNutrition RiskDate of Last RD visit: 10/07/24Date of next RD visit: 10/17/24Nutrition Risk: patient at nutritional riskLevel of Risk: lowNutrition DiagnosisNutrition Diagnosis/Problem: Severe MalnutritionRelated to: decreased appetiteAs Evidenced by: 25-50% intake documented with observed muscle wasting.Malnutrition Diagnosis - Adult: Severe MalnutritionRecommendations:Cont Ensure BIDAdd prosource 30 ml PO BID for skin integrity Consider MVI with mineral tablet once daily ContinueWeekly weight. Interventions:Case reviewed.RD to follow clinical course and adjust nutrition plan as needed. Monitoring / Evaluation:Blood sugar - Goal: fasting/premeal 100-140 mg/dL; Random <180 mg/dLSkin integrity - Goal: intactWeight - Goal: stable during admissionEnergy intake - Goal: >/= 75% estimated needs via oral intakeRD will continue to follow clinical course per protocol and adjust nutrition plan as appropriateEducation:None indicated at this time. Electronically Signed by Arthur Holms MS, RDN

## 2023-09-28 NOTE — Progress Notes
 [facility]	Infectious Disease Progress NoteAttending Provider: Hinton Rao, DO Subjective Subjective: Interim History: The patient has no focal complaints.She has a pure wick in place in the urine is clear.Yesterday her blood pressure was on the low side but today it is better.Review of Allergies/Meds/Hx: I have reviewed the patient's: allergies, current scheduled medications, current infusions, current prn medications, past medical history, past surgical history, family history, and social history.CURRENT MEDSScheduled Meds:Current Facility-Administered Medications Medication Dose Route Frequency Provider Last Rate Last Admin  acetaminophen (TYLENOL) tablet 975 mg  975 mg Oral Q6H Ferrel Logan, PA   975 mg at 09/28/23 1212  apixaban (ELIQUIS) tablet 5 mg  5 mg Oral Q12H Cho, Eunna, DO   5 mg at 09/28/23 0857  chlorhexidine gluconate (HIBICLENS/BETASEPT) 4 % topical liquid   Topical (Top) Daily Ferrel Logan, [place]   Given at 09/28/23 1610  ertapenem Pincus Sanes) 1,000 mg in sodium chloride 0.9% 50 mL (mini-bag plus)  1,000 mg Intravenous Q24H Wendall Papa, MD   Stopped at 09/28/23 1343  famotidine (PEPCID) tablet 20 mg  20 mg Oral Daily Ferrel Logan, [place]   20 mg at 09/28/23 0857  ferrous gluconate (FERGON) tablet 324 mg  324 mg Oral Every Other Day Ferrel Logan, PA   324 mg at 09/27/23 0850  folic acid (FOLVITE) tablet 1 mg  1 mg Oral Daily Ferrel Logan, PA   1 mg at 09/28/23 0857  gabapentin (NEURONTIN) capsule 400 mg  400 mg Oral BID WC Ferrel Logan, PA   400 mg at 09/28/23 0857  polyethylene glycol (MIRALAX) packet 17 g  17 g Oral Daily Ferrel Logan, [place]   17 g at 09/27/23 0849  povidone-iodine (PROFEND) 10 % nasal swab 1 Application  1 Application Nasal Q12H Ferrel Logan, PA   1 Application at 09/28/23 9604  rosuvastatin (CRESTOR) tablet 10 mg  10 mg Oral Nightly Ferrel Logan, [place]   10 mg at 09/27/23 2051  senna (SENOKOT) tablet 8.6 mg  1 tablet Oral Nightly Ferrel Logan, PA   8.6 mg at 09/27/23 2051  sodium chloride 0.9 % flush 10 mL  10 mL Intra-Catheter Q12H Ferrel Logan, PA   10 mL at 09/26/23 0856  sodium chloride 0.9 % flush 3 mL  3 mL IV Push Q8H Ferrel Logan, PA   3 mL at 09/27/23 0648 Continuous Infusions: dextrose 5 % in water (D5W) 75 mL/hr (09/28/23 1345)  sodium chloride 10 mL/hr (09/27/23 2313) PRN Meds:.acetaminophen, dextrose (GLUCOSE) oral liquid 15 g **OR** fruit juice **OR** skim milk, dextrose (GLUCOSE) oral liquid 30 g **OR** fruit juice, dextrose injection, dextrose injection, glucagon, ondansetron **OR** ondansetron (PF), oxyCODONE, oxyCODONE, prochlorperazine **OR** prochlorperazine edisylate, sodium chloride, sodium chloride, sodium chloride Objective Objective: Vitals:Last 24 hours: Temp:  [98 ?F (36.7 ?C)-98.8 ?F (37.1 ?C)] 98 ?F (36.7 ?C)Pulse:  [60-78] 62Resp:  [16-18] 18BP: (97-117)/(64-69) 115/67SpO2:  [98 %-100 %] 100 %I/O's:Gross Totals (Last 24 hours) at 09/28/2023 1658Last data filed at 09/28/2023 1500Intake 2396.49 ml Output 1650 ml Net 746.49 ml  Physical ExamConstitutional:     General: She is not in acute distress.   Appearance: Normal appearance. HENT:    Head: Normocephalic.    Right Ear: External ear normal.    Left Ear: External ear normal.    Nose: Nose normal. Eyes:    General:       Right eye: No discharge.       Left eye: No discharge. Cardiovascular:    Rate and Rhythm: Normal rate and regular rhythm.  Heart sounds: No murmur heard.Pulmonary:    Effort: No respiratory distress.    Breath sounds: No stridor. No wheezing. Abdominal:    General: Abdomen is flat. There is no distension.    Palpations: Abdomen is soft. There is no mass.    Tenderness: There is no abdominal tenderness.    Comments: Ileostomy right abdomen Genitourinary:   Comments: Purewick catheter in placeMusculoskeletal:       General: No swelling or tenderness.    Cervical back: No rigidity.    Comments: Midline in right arm Skin:   Coloration: Skin is not jaundiced or pale. Neurological:    General: No focal deficit present.    Mental Status: She is alert and oriented to person, place, and time. Psychiatric:       Mood and Affect: Mood normal. Labs:I have reviewed the patient's labs within the last 24 hrs. Significant findings are below.Complete Blood CountResults in Past 7 DaysResult Component Current Result Hematocrit 28.70 (L) (09/28/2023) Hemoglobin 8.9 (L) (09/28/2023) MCH 27.6 (09/28/2023) MCHC 31.0 (09/28/2023) MCV 89.1 (09/28/2023) MPV 10.5 (09/28/2023) Platelets 301 (09/28/2023) RBC 3.22 (L) (09/28/2023) WBC 10.0 (09/28/2023) Diagnostics:No new radiology.ECG/Tele Events: I have reviewed the patient's ECG as resulted in the EMR. 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 Janyth Pupa drop.  Following the procedure she developed sepsis.  Urine and blood cultures were negative.  Her antibiotics were broadened from ertapenem to Avycaz and vancomycin but have been restarted with ertapenem as of yesterday.Her WBC and CRP are declining since the 10th 3 septic episode.  The WBC was normal today.D #15 ABXS - D #2 ertapenem (2nd course) after initial 11 days of ertapenem and then 2 days of ceftazidime/avibactam and vancomycinI have reviewed the patient's problem list and updated it as needed.Recommendations:  Continue ertapenem through October 17th (2 weeks after 10/3 urologic procedure when patient became septic)When ready from medicine and Urology, she could go to the SNF as she has a midline already in place.Discussed with Dr. Hulen Luster his outpatient management plans with the stent and ongoing moderate hydroureter.  She likely would need ertapenem prior to another urologic procedure.Discussed with Dr. Roylene Reason and Dr. Chancy Milroy.Electronically Signed:Ramin Zoll Dian Situ, MD Beeper: [phone]/06/2023 4:57 PM

## 2023-09-28 NOTE — Progress Notes
 [facility]	Internal Medicine Progress Note  13Subjective: 24 hour events:- No acute events overnightSubjective:The patient was seen and examined at the bedside.  She denies any acute complaints and was feeling well.  She was eager to go home.The patient's conservator, [name], was updated over the phone.Review of Allergy/PMHx/Meds: Allergies have been reviewed. PMHx and social history reviewed. Scheduled Meds:Current Facility-Administered Medications Medication Dose Route Frequency Provider Last Rate Last Admin  acetaminophen (TYLENOL) tablet 975 mg  975 mg Oral Q6H Ferrel Logan, [place]   975 mg at 09/27/23 2323  apixaban (ELIQUIS) tablet 5 mg  5 mg Oral Q12H Cho, Eunna, DO   5 mg at 09/28/23 0857  chlorhexidine gluconate (HIBICLENS/BETASEPT) 4 % topical liquid   Topical (Top) Daily Ferrel Logan, [place]   Given at 09/28/23 1610  ertapenem Pincus Sanes) 1,000 mg in sodium chloride 0.9% 50 mL (mini-bag plus)  1,000 mg Intravenous Q24H Wendall Papa, MD 100 mL/hr at 09/27/23 1317 1,000 mg at 09/27/23 1317  famotidine (PEPCID) tablet 20 mg  20 mg Oral Daily Ferrel Logan, [place]   20 mg at 09/28/23 0857  ferrous gluconate (FERGON) tablet 324 mg  324 mg Oral Every Other Day Ferrel Logan, PA   324 mg at 09/27/23 0850  folic acid (FOLVITE) tablet 1 mg  1 mg Oral Daily Ferrel Logan, PA   1 mg at 09/28/23 0857  gabapentin (NEURONTIN) capsule 400 mg  400 mg Oral BID WC Ferrel Logan, PA   400 mg at 09/28/23 0857  polyethylene glycol (MIRALAX) packet 17 g  17 g Oral Daily Ferrel Logan, [place]   17 g at 09/27/23 0849  povidone-iodine (PROFEND) 10 % nasal swab 1 Application  1 Application Nasal Q12H Ferrel Logan, PA   1 Application at 09/28/23 9604  rosuvastatin (CRESTOR) tablet 10 mg  10 mg Oral Nightly Ferrel Logan, [place]   10 mg at 09/27/23 2051  senna (SENOKOT) tablet 8.6 mg  1 tablet Oral Nightly Ferrel Logan, PA   8.6 mg at 09/27/23 2051  sodium chloride 0.9 % flush 10 mL  10 mL Intra-Catheter Q12H Ferrel Logan, PA   10 mL at 09/26/23 0856  sodium chloride 0.9 % flush 3 mL  3 mL IV Push Q8H Ferrel Logan, PA   3 mL at 09/27/23 5409 Continuous Infusions: dextrose 5 % in water (D5W) 75 mL/hr (09/27/23 2312)  sodium chloride 10 mL/hr (09/27/23 2313) PRN Meds:acetaminophen, dextrose (GLUCOSE) oral liquid 15 g **OR** fruit juice **OR** skim milk, dextrose (GLUCOSE) oral liquid 30 g **OR** fruit juice, dextrose injection, dextrose injection, glucagon, ondansetron **OR** ondansetron (PF), oxyCODONE, oxyCODONE, prochlorperazine **OR** prochlorperazine edisylate, sodium chloride, sodium chloride, sodium chlorideObjective: Vitals:Temp:  [97.7 ?F (36.5 ?C)-98.8 ?F (37.1 ?C)] 98.1 ?F (36.7 ?C)Pulse:  [60-83] 61Resp:  [16-20] 18BP: (82-117)/(52-69) 117/69SpO2:  [98 %-99 %] 99 %Device (Oxygen Therapy): room air I/O's:Intake/Output Summary (Last 24 hours) at 09/28/2023 1129Last data filed at 09/28/2023 0414Gross per 24 hour Intake 803.46 ml Output 1700 ml Net -896.54 ml  Physical Exam:Gen:  Well appearing, in NADHEENT: Atraumatic, EOMI, MMMCV: nl S1 and S2, RRR, no m/r/gPulm: CTAB, no wheezes, rales or ronchi. Breathing comfortably. GI: Soft, NT, ND, +BS. No guarding or rebound tenderness. Ileostomy in place. Ext: No peripheral edema, WWPNeuro: A&O x 3, EOM intact, face symmetric, Normal bulk and tone. MSK: Full ROM. Skin: No rashesLabs: Recent Labs Lab 10/05/240430 10/06/240739 10/07/240755 WBC 37.0* 20.6* 10.0 HGB 8.7* 8.8* 8.9* HCT 28.00* 27.90* 28.70* PLT 297 307 301  Recent Labs  Lab 10/05/240430 10/06/240739 10/07/240755 NEUTROPHILS 87.4* 87.1* 72.4*  Recent Labs Lab 10/06/241407 10/06/242332 10/07/240755 NA 149* 147* 145 K  --   --  3.7 CL  --   --  120* CO2  --   --  19* BUN  --   --  9 CREATININE  --   --  0.68 GLU  --   --  65* ANIONGAP  -- --  6  Recent Labs Lab 10/04/242001 10/05/240430 10/06/240739 10/07/240755 CALCIUM 8.8 8.9 9.0 9.1 MG 1.4  --   --   --  PHOS 1.8*  --   --   --   Recent Labs Lab 10/05/240430 10/06/240739 10/07/240755 ALT 14 11* 11* AST 35 20 15 ALKPHOS 70 60 67 BILITOT 0.2 0.2 0.2  Recent Labs Lab 10/03/240804 PTT 25.6 LABPROT 11.4 INR 1.11  Diagnostics/Radiology:[place] Abdomen Pelvis w IV ContrastResult Date: 09/25/2023  1. Slight interval retraction of a left ureteral stent with the proximal pigtail now within the renal pelvis/proximal left ureter with increase in moderate left-sided hydronephrosis and left perinephric fluid. No focal perinephric abscess is identified. 2. Constipation with a large ball of formed stool in the rectum without diverticulitis or bowel obstruction. 3. Increased bibasilar atelectasis. 4. Bilateral renal calculi. Findings were discussed with Dr. Hulen Luster at 3:10 PM Radiology Notify System Classification: Clinical team aware. Individual dose optimization technique was used for the performance of this procedure, and one or more of the following dose reduction techniques was used: Automated exposure control and/or adjustment of the MA and/or kV according to patient size and/or the use of iterative reconstruction technique. Reported and signed by:  Geroge Baseman, MD XR Chest PA or APResult Date: 09/25/2023 1. Right midlung field atelectasis. 2. Follow-up PA and lateral radiograph is recommended. Reported and signed by:  Carolan Clines, MD [place] OR Urography RetrogradeResult Date: 09/24/2023 Status post left ureteral stent placement. Radiology Notify System Classification: Routine. Reported and signed by:  Geroge Baseman, MD  Micro:No recent growth from blood cultureECG/Tele Events: NoneAssessment & Plan: Consults: Urology, Infectious [phone] y.o. female with PMHx of PMHx of cognitive impairment, hx DVT, RA, 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 Patient seen by urology, pending left stent exchange on Thursday 10/3. Midline placed 9/30, eliquis transitioned to lovenox for procedure. Patient had procedure 10/3, POD#1 with leukocytosis of 44 and sepsis, [place] AP with expected post procedure changes. Ertapenem changed to ceftazidime-avibactam. Patient is improving.  # Sepsis secondary to acute pyeloureteritis with E coli (ESBL) bacteremia # s/p cystoscopy left stent exchange and stone removal POD#4- initially admitted with pyelonephritis and bacteremia, started on ertapenem, blood culture 9/23 and urine culture growing esbl ecoli- Urology note by Dr. Hulen Luster 9/24: left pyelonephritis excellent stent placement stone is not visualizedMaybe pass- s/p 09/24/2023 cystoscopy, left ureteral replacement, removal of stones with Dr. Hulen Luster, (prior procedure 07/15/2023: right stent was removed  and left stent was advanced to the renal pelvis, small perforation seen, mild extravasation)- Has been having intermittent drops in blood pressures that have been fluid responsive. - Blood cx with E coli ESBL . Urine cx the same- repeat blood culture 9/29 NGTD- s/p ertapenem D 11- s/p ceftazidime avibactam D3,  now back on Ertapenem. May need to continue until 10/17, final antibiotic decision to be made by Infectious Disease.- follow up on cultures post procedure, NGTD- Can be discharged with her midline for IV abx # Hypernatremia - Improved- c/w D5 75 cc/hr given hypoglycemia in AM- Trend Na # Acute on chronic anemia - no signs of bleeding and stool occult negative- partly dilutional due to IV fluid and inflammation from sepsis- s/p 1 unit PRBC 10/3- ferrous gluconate 324mg  every other day- stool occult blood negative- H-h stable # Hx DVT - c/w eliquis # Presence of ostomy - Routine ostomy care- please clean and change dress of the mucus fistula every other day- appreciate surgery consult today to check mucus fistula # HLD- continue crestor 10mg  once daily Other issues stable Hx RAHx peripheral neuropathy - gabapentin dose reduced. Anticipated Disposition Date: Tentatively Wed/Thurs based on final antibiotic decision.DIET: Regular DietFLUIDS: D5 75 cc/hrELECTROLYTES: Replete PRNPPX: EliquisACCESS: MidlineCODE STATUS: Full CodeElectronically Signed:Dr. Roylene Reason, DODate: 10/7/2024Time: 4:02 PM

## 2023-09-29 ENCOUNTER — Telehealth: Admit: 2023-09-29 | Payer: PRIVATE HEALTH INSURANCE | Attending: Urology | Primary: Nephrology

## 2023-09-29 LAB — COMPREHENSIVE METABOLIC PANEL
BKR A/G RATIO: 0.4
BKR ALANINE AMINOTRANSFERASE (ALT): 8 U/L — ABNORMAL LOW (ref 12–78)
BKR ALBUMIN: 1.4 g/dL — ABNORMAL LOW (ref 3.4–5.0)
BKR ALKALINE PHOSPHATASE: 52 U/L (ref 20–120)
BKR ANION GAP: 7 (ref 5–18)
BKR ASPARTATE AMINOTRANSFERASE (AST): 11 U/L (ref 5–37)
BKR AST/ALT RATIO: 1.4
BKR BILIRUBIN TOTAL: 0.2 mg/dL (ref 0.0–1.0)
BKR BLOOD UREA NITROGEN: 7 mg/dL — ABNORMAL LOW (ref 8–25)
BKR BUN / CREAT RATIO: 15.9 (ref 8.0–25.0)
BKR CALCIUM: 7 mg/dL — ABNORMAL LOW (ref 8.4–10.3)
BKR CHLORIDE: 128 mmol/L — ABNORMAL HIGH (ref 95–115)
BKR CO2: 15 mmol/L — ABNORMAL LOW (ref 21–32)
BKR CREATININE: 0.44 mg/dL — ABNORMAL LOW (ref 0.50–1.30)
BKR EGFR, CREATININE (CKD-EPI 2021): >60 mL/min/{1.73_m2} (ref >=60)
BKR GLOBULIN: 3.3 g/dL
BKR GLUCOSE: 100 mg/dL (ref 70–100)
BKR OSMOLALITY CALCULATION: 296 mosm/kg — ABNORMAL HIGH (ref 275–295)
BKR POTASSIUM: 2.5 mmol/L — ABNORMAL LOW (ref 3.5–5.1)
BKR PROTEIN TOTAL: 4.7 g/dL — ABNORMAL LOW (ref 6.4–8.2)
BKR SODIUM: 150 mmol/L — ABNORMAL HIGH (ref 136–145)

## 2023-09-29 LAB — CBC WITH AUTO DIFFERENTIAL
BKR WAM ABSOLUTE NRBC (2 DEC): 0 x 1000/ÂµL (ref 0.00–1.00)
BKR WAM HEMATOCRIT (2 DEC): 27.3 % — ABNORMAL LOW (ref 35.00–45.00)
BKR WAM HEMOGLOBIN: 8.4 g/dL — ABNORMAL LOW (ref 11.7–15.5)
BKR WAM MCH (PG): 27.8 pg (ref 27.0–33.0)
BKR WAM MCHC: 30.8 g/dL — ABNORMAL LOW (ref 31.0–36.0)
BKR WAM MCV: 90.4 fL (ref 80.0–100.0)
BKR WAM MPV: 9.9 fL (ref 8.0–12.0)
BKR WAM NUCLEATED RED BLOOD CELLS: 0 % (ref 0.0–1.0)
BKR WAM PLATELETS: 266 x1000/ÂµL (ref 150–420)
BKR WAM RDW-CV: 17.2 % — ABNORMAL HIGH (ref 11.0–15.0)
BKR WAM RED BLOOD CELL COUNT.: 3.02 M/ÂµL — ABNORMAL LOW (ref 4.00–6.00)
BKR WAM WHITE BLOOD CELL COUNT: 7.1 x1000/ÂµL (ref 4.0–11.0)

## 2023-09-29 LAB — MANUAL DIFFERENTIAL
BKR WAM ATYPICAL LYMPHOCYTES (DIFF) 1 DEC: 1.8 % — ABNORMAL HIGH (ref 0.0–1.0)
BKR WAM BASOPHIL - ABS (DIFF) 2 DEC: 0 x 1000/ÂµL (ref 0.00–1.00)
BKR WAM BASOPHILS (DIFF): 0 % (ref 0.0–1.4)
BKR WAM EOSINOPHILS (DIFF) 2 DEC: 0.33 x 1000/ÂµL (ref 0.00–1.00)
BKR WAM EOSINOPHILS (DIFF): 4.6 % (ref 0.0–5.0)
BKR WAM LYMPHOCYTE - ABS (DIFF) 2 DEC: 2.46 x 1000/ÂµL (ref 0.60–3.70)
BKR WAM LYMPHOCYTES (DIFF): 34.5 % (ref 17.0–50.0)
BKR WAM MONOCYTE - ABS (DIFF) 2 DEC: 0.32 x 1000/ÂµL (ref 0.00–1.00)
BKR WAM MONOCYTES (DIFF): 4.5 % (ref 4.0–12.0)
BKR WAM NEUTROPHILS (DIFF): 54.6 % (ref 39.0–72.0)
BKR WAM NEUTROPHILS - ABS (DIFF) 2 DEC: 3.9 x 1000/ÂµL (ref 2.00–7.60)

## 2023-09-29 LAB — MAGNESIUM: BKR MAGNESIUM: 1.7 mg/dL (ref 1.4–2.2)

## 2023-09-29 LAB — BASIC METABOLIC PANEL
BKR ANION GAP: 2 — ABNORMAL LOW (ref 5–18)
BKR BLOOD UREA NITROGEN: 8 mg/dL (ref 8–25)
BKR BUN / CREAT RATIO: 10.4 (ref 8.0–25.0)
BKR CALCIUM: 9.6 mg/dL (ref 8.4–10.3)
BKR CHLORIDE: 118 mmol/L — ABNORMAL HIGH (ref 95–115)
BKR CO2: 27 mmol/L (ref 21–32)
BKR CREATININE: 0.77 mg/dL (ref 0.50–1.30)
BKR EGFR, CREATININE (CKD-EPI 2021): >60 mL/min/{1.73_m2} (ref >=60)
BKR GLUCOSE: 101 mg/dL — ABNORMAL HIGH (ref 70–100)
BKR OSMOLALITY CALCULATION: 291 mosm/kg (ref 275–295)
BKR POTASSIUM: 3.1 mmol/L — ABNORMAL LOW (ref 3.5–5.1)
BKR SODIUM: 147 mmol/L — ABNORMAL HIGH (ref 136–145)

## 2023-09-29 MED ORDER — LIDOCAINE 4 % TOPICAL PATCH
4 % | TRANSDERMAL | Status: DC
Start: 2023-09-29 — End: 2023-09-30

## 2023-09-29 MED ORDER — POTASSIUM CHLORIDE ER 20 MEQ TABLET,EXTENDED RELEASE(PART/CRYST)
20 | Freq: Once | ORAL | Status: CP
Start: 2023-09-29 — End: ?
  Administered 2023-09-29: 20:00:00 20 MEQ via ORAL

## 2023-09-29 NOTE — Telephone Encounter
 Left message to rn rose with day/timeWill confirm on Thursday

## 2023-09-29 NOTE — Plan of Care
 Plan of Care Overview/ Patient Status    Assumed care for the pt 19:00; pt A&O x4; stated wants and ready to be d/c-ed; vss on room air; pt denies pain/discomfort; refused scheduled Tylenol at midnight; no s/sx of cardiac or respiratory distress; pure wick in place; urine concentrated; PO fluids encouraged; HS  FS-130 mg/dL, pt asymptomatic; double lumen midline to RUE with dressing CDI; IVF infusing continuously; ileostomy active with puree consistency dark brown & greenish stool; colostomy dressing CDI; abdomen soft; non-distended; denies GI distress; pt bedrest this shift; educated on safety; encouraged to call for assistance as needed; call light within reach; contact isolation precautions maintained; Problem: Adult Inpatient Plan of CareGoal: Plan of Care ReviewOutcome: Interventions implemented as appropriateGoal: Patient-Specific Goal (Individualized)Outcome: Interventions implemented as appropriateGoal: Absence of Hospital-Acquired Illness or InjuryOutcome: Interventions implemented as appropriateGoal: Optimal Comfort and WellbeingOutcome: Interventions implemented as appropriateGoal: Readiness for Transition of CareOutcome: Interventions implemented as appropriate Problem: Fall Injury RiskGoal: Absence of Fall and Fall-Related InjuryOutcome: Interventions implemented as appropriate Problem: Electrolyte ImbalanceGoal: Electrolyte Imbalance: Plan of CareOutcome: Interventions implemented as appropriate Problem: UTI (Urinary Tract Infection)Goal: Improved Infection SymptomsOutcome: Interventions implemented as appropriate Problem: Physical Therapy GoalsGoal: Physical Therapy GoalsDescription: 1) bed mob w/ minx12) txfrs w/ RW mod a x2Outcome: Interventions implemented as appropriate Problem: InfectionGoal: Absence of Infection Signs and SymptomsOutcome: Interventions implemented as appropriate

## 2023-09-29 NOTE — Plan of Care
 Plan of Care Overview/ Patient Status    Spoke to Royce Macadamia re  ID MD to Continue ertapenem through October 17th . Confirms may administer it as a LTC patient . No need for PASRR. Confirmed with Unit RN - patient currently has a Midline . 12:04 PM As per MD , for dc tomorrow at 11 am. Tasked TC to kindly arrange after speaking to the patient / family and inform NW

## 2023-09-29 NOTE — Plan of Care
 Plan of Care Overview/ Patient Status    Assumed care for the pt 0700; pt is A&O x4; vss on room air; pt c/o of 8/10 pain to lower mid back. tylenol given with good effect. RTM+SpO2 d/c. No s/sx of cardiac or respiratory distress. Pure wick in place; urine concentrated; PO fluids encouraged; double lumen midline to RUE with dressing CDI; IVF D5W amd KVO infusing well; ileostomy active with puree consistency dark brown & greenish stool; colostomy dressing CDI; abdomen soft; non-distended; denies GI distress; PT eval done on pt. Pt unable to get up with sara steady or Sara 3000. Pt educated on safety; encouraged to call for assistance as needed; call light within reach; contact isolation precautions maintained. Care ongoing.Problem: UTI (Urinary Tract Infection)Goal: Improved Infection SymptomsOutcome: Interventions implemented as appropriate Problem: Physical Therapy GoalsGoal: Physical Therapy GoalsDescription: 1) bed mob w/ minx12) txfrs w/ RW mod a x2Outcome: Interventions implemented as appropriate Problem: Skin Injury Risk IncreasedGoal: Skin Health and IntegrityOutcome: Interventions implemented as appropriate Problem: Fall Injury RiskGoal: Absence of Fall and Fall-Related InjuryOutcome: Interventions implemented as appropriate Problem: Electrolyte ImbalanceGoal: Electrolyte Imbalance: Plan of CareOutcome: Interventions implemented as appropriate

## 2023-09-29 NOTE — Care Coordination-Inpatient
 Spoke with [facility] they can accept the pt at 11)) tomorrow .Spoke with pt to  up date the pt is being Dc to [facility] at 11:00 am tomorrow .IMM discussed with _ patient _; with no intent to appeal discharge. IMM given to secretary to scan in patient's chart. Transport set up through [facility] [phone] for Patient via Ambulance Tax inspector) to [employer]. Patient/Family made aware that fee for transport will be private pay and bill will be mailed to home. Family can inquire for further questions to Access at [phone]. Ba set up Rn and staff is aware.

## 2023-09-29 NOTE — Telephone Encounter
-----   Message from [name], MD sent at 09/24/2023  3:57 PM EDT -----Cysto stent removal nov6 must be that day

## 2023-09-29 NOTE — Progress Notes
 [facility] Medicine Progress NoteAttending Provider: Hinton Rao, DO Subjective                                                                              Subjective: Interim History:  Patient seen at bedside, no new complaints overnight. Plan to discharge 11:00 a.m. tomorrow to [place]. Review of Allergies/Meds/Hx: Review of Allergies/Meds/Hx:I have reviewed the patient's: allergies, current scheduled medications, current infusions, current prn medications, past medical history, and past surgical history Objective Objective: Vitals:Last 24 hours: Temp:  [98.6 ?F (37 ?C)-99.1 ?F (37.3 ?C)] 99 ?F (37.2 ?C)Pulse:  [63-81] 63Resp:  [18-22] 18BP: (95-104)/(56-63) 104/63SpO2:  [96 %-100 %] 100 %I/O's:Gross Totals (Last 24 hours) at 09/29/2023 1351Last data filed at 09/29/2023 0930Intake 874 ml Output 1825 ml Net -951 ml Procedures:None Physical ExamConstitutional:     Appearance: Normal appearance. Cardiovascular:    Rate and Rhythm: Normal rate and regular rhythm.    Pulses: Normal pulses.    Heart sounds: Normal heart sounds. Pulmonary:    Effort: Pulmonary effort is normal.    Breath sounds: Normal breath sounds. Abdominal:    Palpations: Abdomen is soft. Musculoskeletal:    Right lower leg: No edema.    Left lower leg: No edema. Skin:   General: Skin is warm. Neurological:    Mental Status: Mental status is at baseline.  Labs:Last 24 hours: Recent Results (from the past 24 hour(s)) POC Glucose (Fingerstick)  Collection Time: 09/28/23  4:32 PM Result Value Ref Range  Glucose, Meter 117 (H) 70 - 100 mg/dL POC Glucose (Fingerstick)  Collection Time: 09/28/23  9:48 PM Result Value Ref Range  Glucose, Meter 130 (H) 70 - 100 mg/dL POC Glucose (Fingerstick)  Collection Time: 09/29/23 11:26 AM Result Value Ref Range  Glucose, Meter 90 70 - 100 mg/dL Diagnostics:No new radiology.ECG/Tele Events: No ECG ordered today Assessment Assessment: Assessment:67 y.o. female with PMHx of PMHx of cognitive impairment, hx DVT, RA, 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 Patient seen by urology, pending left stent exchange on Thursday 10/3. Midline placed 9/30, eliquis transitioned to lovenox for procedure. Patient had procedure 10/3, POD#1 with leukocytosis of 44 and sepsis, [place] AP with expected post procedure changes. Ertapenem changed to ceftazidime-avibactam. Patient is improving. Principal Problem:  Sepsis, due to unspecified organism, unspecified whether acute organ dysfunction present (HC Code) (HC CODE) (HC Code)  SNOMED [place](R): SEPSIS  Active Problems:  UTI (urinary tract infection)  SNOMED [place](R): URINARY TRACT INFECTIOUS DISEASE    S/P cystoscopy with ureteral stent placement  SNOMED [place](R): HISTORY OF INSERTION OF STENT INTO URETER    UTI due to extended-spectrum beta lactamase (ESBL) producing Escherichia coli  SNOMED [place](R): INFECTION CAUSED BY EXTENDED SPECTRUM BETA-LACTAMASE PRODUCING ESCHERICHIA COLI    Plan Plan: Sepsis secondary to acute pyeloureteritis with E coli (ESBL) bacteremia s/p cystoscopy left stent exchange and stone removal POD#4-  initially admitted with pyelonephritis and bacteremia- blood culture 9/23 and urine culture growing esbl ecoli- s/p 09/24/2023 cystoscopy, left ureteral replacement, removal of stones with Dr. Hulen Luster- Has been having intermittent drops in blood pressures that have been fluid responsive. - Blood cx with E coli ESBL . Urine cx the same- repeat blood culture 9/29 NGTD- s/p ertapenem D 11- s/p ceftazidime avibactam D3, now back on Ertapenem. -need to continue until 10/17, midline in place - follow up on cultures post procedure, NGTD Hypernatremia - Improved- c/w D5 75 cc/hr- Trend Na Acute on chronic anemia - no signs of bleeding and stool occult negative- partly dilutional due to IV fluid and inflammation from sepsis- s/p 1 unit PRBC 10/3- ferrous gluconate 324mg  every other day- stool occult blood negative- H-H stable Hx DVT - c/w eliquis Presence of ostomy - Routine ostomy care- clean and change dress of the mucus fistula every other day HLD- continue crestor 10mg  once daily Hx RAHx peripheral neuropathy - gabapentin 400 mg b.i.d.Code: FullPPX: eliquis Diet: regular Dispo:  Plan to discharge to [facility] tomorrow at 11:00 a.m. Comorbidities Comorbidities present on admission:Coagulopathic due to prescription of anticoagulant medication prior to admission. Secondary diagnoses occurring during hospitalization:HypomagnesemiaHyponatremiaHypernatremiaHypophosphatemia Electronically Signed:Faylene Allerton, FNP MHB10/07/2023, 1:51 PM

## 2023-09-29 NOTE — Plan of Care
 Plan of Care Overview/ Patient Status    Assumed care for the pt 0700; pt is A&O x4; vss on room air; pt c/o of 8/10 pain to lower mid back. tylenol given. No s/sx of cardiac or respiratory distress. Pure wick in place; urine concentrated; PO fluids encouraged; double lumen midline to RUE with dressing CDI; IVF D5W and KVO infusing well; PO fluids encouraged. K+ repleted. ileostomy active with puree consistency dark brown; colostomy dressing CDI; abdomen soft; non-distended; denies GI distress; Miralax not given d/t discussed with APRN and not indicated. FS ACHS. Pt is a hoyer lift per PT eval yesterday. Pt educated on safety; encouraged to call for assistance as needed; call light within reach; contact isolation precautions maintained. Care ongoing. Problem: UTI (Urinary Tract Infection)Goal: Improved Infection SymptomsOutcome: Interventions implemented as appropriate Problem: Physical Therapy GoalsGoal: Physical Therapy GoalsDescription: 1) bed mob w/ minx12) txfrs w/ RW mod a x2Outcome: Interventions implemented as appropriate Problem: Skin Injury Risk IncreasedGoal: Skin Health and IntegrityOutcome: Interventions implemented as appropriate Problem: Fall Injury RiskGoal: Absence of Fall and Fall-Related InjuryOutcome: Interventions implemented as appropriate Problem: Electrolyte ImbalanceGoal: Electrolyte Imbalance: Plan of CareOutcome: Interventions implemented as appropriat

## 2023-09-30 DIAGNOSIS — Z87442 Personal history of urinary calculi: Secondary | ICD-10-CM

## 2023-09-30 DIAGNOSIS — G8220 Paraplegia, unspecified: Secondary | ICD-10-CM

## 2023-09-30 DIAGNOSIS — Z1612 Extended spectrum beta lactamase (ESBL) resistance: Secondary | ICD-10-CM

## 2023-09-30 DIAGNOSIS — A4151 Sepsis due to Escherichia coli [E. coli]: Secondary | ICD-10-CM

## 2023-09-30 DIAGNOSIS — I959 Hypotension, unspecified: Secondary | ICD-10-CM

## 2023-09-30 DIAGNOSIS — Z932 Ileostomy status: Secondary | ICD-10-CM

## 2023-09-30 DIAGNOSIS — N10 Acute tubulo-interstitial nephritis: Secondary | ICD-10-CM

## 2023-09-30 DIAGNOSIS — Z7401 Bed confinement status: Secondary | ICD-10-CM

## 2023-09-30 DIAGNOSIS — D849 Immunodeficiency, unspecified: Secondary | ICD-10-CM

## 2023-09-30 DIAGNOSIS — G8929 Other chronic pain: Secondary | ICD-10-CM

## 2023-09-30 DIAGNOSIS — E1142 Type 2 diabetes mellitus with diabetic polyneuropathy: Secondary | ICD-10-CM

## 2023-09-30 DIAGNOSIS — T83592A Infection and inflammatory reaction due to indwelling ureteral stent, initial encounter: Secondary | ICD-10-CM

## 2023-09-30 DIAGNOSIS — Z7901 Long term (current) use of anticoagulants: Secondary | ICD-10-CM

## 2023-09-30 DIAGNOSIS — N136 Pyonephrosis: Secondary | ICD-10-CM

## 2023-09-30 DIAGNOSIS — E782 Mixed hyperlipidemia: Secondary | ICD-10-CM

## 2023-09-30 DIAGNOSIS — F0390 Unspecified dementia without behavioral disturbance: Secondary | ICD-10-CM

## 2023-09-30 DIAGNOSIS — E876 Hypokalemia: Secondary | ICD-10-CM

## 2023-09-30 DIAGNOSIS — E870 Hyperosmolality and hypernatremia: Secondary | ICD-10-CM

## 2023-09-30 DIAGNOSIS — D649 Anemia, unspecified: Secondary | ICD-10-CM

## 2023-09-30 DIAGNOSIS — Z9049 Acquired absence of other specified parts of digestive tract: Secondary | ICD-10-CM

## 2023-09-30 DIAGNOSIS — R6520 Severe sepsis without septic shock: Secondary | ICD-10-CM

## 2023-09-30 DIAGNOSIS — Z931 Gastrostomy status: Secondary | ICD-10-CM

## 2023-09-30 DIAGNOSIS — R0902 Hypoxemia: Secondary | ICD-10-CM

## 2023-09-30 DIAGNOSIS — E871 Hypo-osmolality and hyponatremia: Secondary | ICD-10-CM

## 2023-09-30 DIAGNOSIS — K219 Gastro-esophageal reflux disease without esophagitis: Secondary | ICD-10-CM

## 2023-09-30 DIAGNOSIS — M1990 Unspecified osteoarthritis, unspecified site: Secondary | ICD-10-CM

## 2023-09-30 DIAGNOSIS — Z86718 Personal history of other venous thrombosis and embolism: Secondary | ICD-10-CM

## 2023-09-30 DIAGNOSIS — Z6821 Body mass index (BMI) 21.0-21.9, adult: Secondary | ICD-10-CM

## 2023-09-30 DIAGNOSIS — N179 Acute kidney failure, unspecified: Secondary | ICD-10-CM

## 2023-09-30 DIAGNOSIS — Z79899 Other long term (current) drug therapy: Secondary | ICD-10-CM

## 2023-09-30 DIAGNOSIS — E43 Unspecified severe protein-calorie malnutrition: Secondary | ICD-10-CM

## 2023-09-30 DIAGNOSIS — Z1152 Encounter for screening for COVID-19: Secondary | ICD-10-CM

## 2023-09-30 DIAGNOSIS — M069 Rheumatoid arthritis, unspecified: Secondary | ICD-10-CM

## 2023-09-30 DIAGNOSIS — G9341 Metabolic encephalopathy: Secondary | ICD-10-CM

## 2023-09-30 LAB — CBC WITH AUTO DIFFERENTIAL
BKR WAM ABSOLUTE IMMATURE GRANULOCYTES.: 0.05 x 1000/ÂµL (ref 0.00–0.30)
BKR WAM ABSOLUTE LYMPHOCYTE COUNT.: 1.42 x 1000/ÂµL (ref 0.60–3.70)
BKR WAM ABSOLUTE NRBC (2 DEC): 0 x 1000/ÂµL (ref 0.00–1.00)
BKR WAM ANC (ABSOLUTE NEUTROPHIL COUNT): 4.33 x 1000/ÂµL (ref 2.00–7.60)
BKR WAM BASOPHIL ABSOLUTE COUNT.: 0.04 x 1000/ÂµL (ref 0.00–1.00)
BKR WAM BASOPHILS: 0.6 % (ref 0.0–1.4)
BKR WAM EOSINOPHIL ABSOLUTE COUNT.: 0.4 x 1000/ÂµL (ref 0.00–1.00)
BKR WAM EOSINOPHILS: 5.8 % — ABNORMAL HIGH (ref 0.0–5.0)
BKR WAM HEMATOCRIT (2 DEC): 27.2 % — ABNORMAL LOW (ref 35.00–45.00)
BKR WAM HEMOGLOBIN: 8.3 g/dL — ABNORMAL LOW (ref 11.7–15.5)
BKR WAM IMMATURE GRANULOCYTES: 0.7 % (ref 0.0–1.0)
BKR WAM LYMPHOCYTES: 20.7 % (ref 17.0–50.0)
BKR WAM MCH (PG): 27.6 pg (ref 27.0–33.0)
BKR WAM MCHC: 30.5 g/dL — ABNORMAL LOW (ref 31.0–36.0)
BKR WAM MCV: 90.4 fL (ref 80.0–100.0)
BKR WAM MONOCYTE ABSOLUTE COUNT.: 0.62 x 1000/ÂµL (ref 0.00–1.00)
BKR WAM MONOCYTES: 9 % (ref 4.0–12.0)
BKR WAM MPV: 9.9 fL (ref 8.0–12.0)
BKR WAM NEUTROPHILS: 63.2 % (ref 39.0–72.0)
BKR WAM NUCLEATED RED BLOOD CELLS: 0 % (ref 0.0–1.0)
BKR WAM PLATELETS: 269 x1000/ÂµL (ref 150–420)
BKR WAM RDW-CV: 17.2 % — ABNORMAL HIGH (ref 11.0–15.0)
BKR WAM RED BLOOD CELL COUNT.: 3.01 M/ÂµL — ABNORMAL LOW (ref 4.00–6.00)
BKR WAM WHITE BLOOD CELL COUNT: 6.9 x1000/ÂµL (ref 4.0–11.0)

## 2023-09-30 LAB — COMPREHENSIVE METABOLIC PANEL
BKR A/G RATIO: 0.4
BKR ALANINE AMINOTRANSFERASE (ALT): 9 U/L — ABNORMAL LOW (ref 12–78)
BKR ALBUMIN: 1.9 g/dL — ABNORMAL LOW (ref 3.4–5.0)
BKR ALKALINE PHOSPHATASE: 74 U/L (ref 20–120)
BKR ANION GAP: 4 — ABNORMAL LOW (ref 5–18)
BKR ASPARTATE AMINOTRANSFERASE (AST): 13 U/L (ref 5–37)
BKR AST/ALT RATIO: 1.4
BKR BILIRUBIN TOTAL: 0.1 mg/dL (ref 0.0–1.0)
BKR BLOOD UREA NITROGEN: 9 mg/dL (ref 8–25)
BKR BUN / CREAT RATIO: 14.5 (ref 8.0–25.0)
BKR CALCIUM: 9.4 mg/dL (ref 8.4–10.3)
BKR CHLORIDE: 118 mmol/L — ABNORMAL HIGH (ref 95–115)
BKR CO2: 21 mmol/L (ref 21–32)
BKR CREATININE: 0.62 mg/dL (ref 0.50–1.30)
BKR EGFR, CREATININE (CKD-EPI 2021): >60 mL/min/{1.73_m2} (ref >=60)
BKR GLOBULIN: 4.5 g/dL
BKR GLUCOSE: 92 mg/dL (ref 70–100)
BKR OSMOLALITY CALCULATION: 283 mosm/kg (ref 275–295)
BKR POTASSIUM: 3.9 mmol/L (ref 3.5–5.1)
BKR PROTEIN TOTAL: 6.4 g/dL (ref 6.4–8.2)
BKR SODIUM: 143 mmol/L (ref 136–145)

## 2023-09-30 MED ORDER — FERROUS GLUCONATE 324 MG (38 MG IRON) TABLET
324 | ORAL | Status: AC
Start: 2023-09-30 — End: ?

## 2023-09-30 MED ORDER — ERTAPENEM 1000 MG MBP
INTRAVENOUS | Status: AC
Start: 2023-09-30 — End: ?

## 2023-09-30 NOTE — Discharge Summary
 [facility] SummaryPatient Data:  Patient Name: Danielle [name] Admit date: 09/14/2023 Age: 67 y.o. Discharge date: 09/30/23 DOB: 06-22-1956	 Discharge Attending Physician: Hinton Rao DO  MRN: [id number]	 Discharged Condition: fair PCP: Manson Passey, MD  Disposition: Skilled Nursing Facility for Short Term Rehab Principal Diagnosis: Sepsis secondary to acute pyeloureteritis with E coli (ESBL) bacteremia Comorbidities Comorbidities present on admission:Coagulopathic due to prescription of anticoagulant medication prior to admission. Secondary diagnoses occurring during hospitalization:HypokalemiaHypocalcemiaHypomagnesemiaHyponatremiaHypernatremiaHypophosphatemia  Post Discharge Follow Up Items: Issues to be Addressed Post Discharge:Medication changes on discharge (Full medication list at conclusion of this summary) :Current Discharge Medication List  Discontinued   gabapentin (NEURONTIN) 600 mg tablet    hydrOXYchloroQUINE (PLAQUENIL) 200 mg tablet    potassium citrate (UROCIT-K) 15 mEq extended release tablet    New  Details ertapenem (INVanz) 1,000 mg in sodium chloride 0.9% 50 mL (mini-bag plus) Inject 1,000 mg into the vein every 24 hours for 8 days.Start date: 09/30/2023, End date: 10/08/2023   Changed  Details ferrous gluconate (FERGON) 324 mg (38 mg iron) tablet Take 1 tablet (324 mg total) by mouth every other day.Start date: 10/01/2023   Pending Labs and Tests: Pending Lab Results   Order Current Status  Kidney Stone analysis In process  Blood culture #1 Preliminary result  Follow-up Information:Walsh, Barnetta Hammersmith, MDNATHANIEL [facility] [phone], Janyth Pupa, MD[address] [phone] an appointment as soon as possible for a visit in 1 week(s) Future Appointments Date Time Provider Department Center 10/08/2023  9:45 AM [facility]  [facility] 10/08/2023 10:00 AM Cornell, Lise Auer, APRN [facility] 10/28/2023 10:30 AM [name], MD [facility] 01/21/2024 11:30 AM [name], MD Endo None Hospital Course: 67 y.o. female PMHx of cognitive impairment, hx DVT, RA, 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  left stent exchange on Thursday 10/3. Midline placed 9/30, eliquis transitioned to lovenox for procedure and since transitioned back to eliquis. Ertapenem changed to ceftazidime-avibactam, and patient should continue antibiotics until 10/17. Patient to follow up with urology when discharged from rehab. Inpatient Consultants and summary of recommendations:Pertinent Procedures or Surgeries: Data: Pertinent lab findings:Recent Labs Lab 10/07/240755 10/08/241340 10/09/240753 WBC 10.0 7.1 6.9 HGB 8.9* 8.4* 8.3* HCT 28.70* 27.30* 27.20* PLT 301 266 269  Recent Labs Lab 10/06/240739 10/07/240755 10/09/240753 NEUTROPHILS 87.1* 72.4* 63.2  Recent Labs Lab 10/08/241340 10/08/241509 10/08/241634 10/09/240753 NA 150* 147*  --  143 K 2.5* 3.1*  --  3.9 CL 128* 118*  --  118* CO2 15* 27  --  21 BUN 7* 8  --  9 CREATININE 0.44* 0.77  --  0.62 GLU 100 101*   < > 92 ANIONGAP 7 2*  --  4*  < > = values in this interval not displayed.  Recent Labs Lab 10/04/242001 10/05/240430 10/08/241340 10/08/241509 10/09/240753 CALCIUM 8.8   < > 7.0* 9.6 9.4 MG 1.4  --   --  1.7  --  PHOS 1.8*  --   --   --   --   < > = values in this interval not displayed.  Recent Labs Lab 10/07/240755 10/08/241340 10/09/240753 ALT 11* 8* 9* AST 15 11 13  ALKPHOS 67 52 74 BILITOT 0.2 0.2 0.1  Recent Labs Lab 10/03/240804 PTT 25.6 LABPROT 11.4 INR 1.11  Microbiology:Recent Labs Lab 10/03/241452 10/04/241545 LABBLOO  --  No Growth to Date LABURIN No Growth  --  Imaging: Imaging results last 1 week:  Marcus Abdomen Pelvis w IV ContrastResult Date: 09/25/2023  1. Slight interval retraction of a left ureteral stent with the proximal pigtail now within the renal pelvis/proximal left ureter with increase in moderate left-sided hydronephrosis and left perinephric fluid. No focal perinephric abscess is identified. 2. Constipation with a large ball of formed stool in the rectum without diverticulitis or bowel obstruction. 3. Increased bibasilar atelectasis. 4. Bilateral renal calculi. Findings were discussed with Dr. Hulen Luster at 3:10 PM Radiology Notify System Classification: Clinical team aware. Individual dose optimization technique was used for the performance of this procedure, and one or more of the following dose reduction techniques was used: Automated exposure control and/or adjustment of the MA and/or kV according to patient size and/or the use of iterative reconstruction technique. Reported and signed by:  Geroge Baseman, MD XR Chest PA or APResult Date: 09/25/2023 1. Right midlung field atelectasis. 2. Follow-up PA and lateral radiograph is recommended. Reported and signed by:  Carolan Clines, MD [place] OR Urography RetrogradeResult Date: 09/24/2023 Status post left ureteral stent placement. Radiology Notify System Classification: Routine. Reported and signed by:  Geroge Baseman, MD  Diet:  Diet RegularNutrition SupplementsMobility: Highest Level of mobility - ACTUAL: Mobility Level 2, Turn self in bed/bed activity/dependent transfer, AM PAC 6-7Physical Therapy Disposition Recommendation: Moderate complexity Physical Exam Discharge vital signs: Vitals:  09/30/23 0740 BP: 102/65 Pulse: 62 Resp: 18 Temp:  Cognitive Status at Discharge: BaselinePhysical ExamConstitutional:     Appearance: Normal appearance. Cardiovascular:    Rate and Rhythm: Normal rate and regular rhythm.    Pulses: Normal pulses.    Heart sounds: Normal heart sounds. Pulmonary:    Breath sounds: Normal breath sounds. Abdominal:    General: Bowel sounds are normal.    Palpations: Abdomen is soft. Musculoskeletal:    Right lower leg: No edema.    Left lower leg: No edema. Skin:   General: Skin is warm. Neurological:    Mental Status: She is alert. Mental status is at baseline. 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 (HC Code) (HC CODE) (HC Code)   Dysphagia   Encephalopathy   ESBL (extended spectrum beta-lactamase) producing bacteria infection   GERD (gastroesophageal reflux disease)   Hypercalcemia   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 CODE) (HC Code)   Incontinent of urine   Paraplegia (HC Code)   Rheumatoid arthritis (HC Code)   Past Surgical History: Procedure Laterality Date  BAND HEMORRHOIDECTOMY    CYSTECTOMY  1972  ILEOSTOMY    LAPAROSCOPIC SALPINGOOPHERECTOMY  1986  LAPAROTOMY  2022  PARTIAL COLECTOMY  2022  PEG TUBE PLACEMENT  01/2022  UPPER GASTROINTESTINAL ENDOSCOPY    Social History Family History Social History Tobacco Use  Smoking status: Never  Smokeless tobacco: Never Substance Use Topics  Alcohol use: Not Currently  No family history on file.   Discharge Medications  Discharge: Current Discharge Medication List  START taking  these medications  Details ertapenem (INVanz) 1,000 mg in sodium chloride 0.9% 50 mL (mini-bag plus) Inject 1,000 mg into the vein every 24 hours for 8 days.Start date: 09/30/2023, End date: 10/08/2023   CONTINUE these medications which have CHANGED  Details ferrous gluconate (FERGON) 324 mg (38 mg iron) tablet Take 1 tablet (324 mg total) by mouth every other day.Start date: 10/01/2023   CONTINUE these medications which have NOT CHANGED  Details acetaminophen (TYLENOL) 325 mg tablet Take 3 tablets (975 mg total) by mouth every 8 (eight) hours as needed for pain.  apixaban (ELIQUIS) 5 mg tablet 1 tablet (5 mg total) by Per G Tube route every 12 (twelve) hours.  folic acid (FOLVITE) 1 mg tablet Take 1 tablet (1 mg total) by mouth daily.Qty: 30 tablet, Refills: 11  gabapentin (NEURONTIN) 400 mg capsule Take 1 capsule (400 mg total) by mouth 2 (two) times daily with breakfast and dinner.  magnesium hydroxide (MILK OF MAGNESIA) 400 mg/5 mL suspension Take 30 mLs by mouth daily as needed for constipation.  melatonin 3 mg tablet Take 1 tablet (3 mg total) by mouth nightly.Refills: 0  pantoprazole (PROTONIX) 40 mg tablet Take 1 tablet (40 mg total) by mouth daily.Qty: 30 tablet, Refills: 11  polyethylene glycol (MIRALAX) 17 gram packet Take 1 packet (17 g total) by mouth daily. Mix in 8 ounces of water, juice, soda, coffee or tea prior to taking.Qty: 14 each, Refills: 2  rosuvastatin (CRESTOR) 10 mg tablet 1 tablet (10 mg total) by Per G Tube route nightly.  traMADoL (ULTRAM) 50 mg tablet Take 1 tablet (50 mg total) by mouth every 8 (eight) hours as needed for pain. STOP taking these medications   gabapentin (NEURONTIN) 600 mg tablet    hydrOXYchloroQUINE (PLAQUENIL) 200 mg tablet    potassium citrate (UROCIT-K) 15 mEq extended release tablet      35 minutes spent on the discharge of this patientElectronically Signed:Freya Zobrist Nell Range, FNP 09/30/2023 9:56 AMBest Contact Information: [phone]

## 2023-09-30 NOTE — Plan of Care
 Midline Catheter Insertion Date: 9/30/2024French Size: 4Location (Right or Left Arm): RightPressure Injectable (Yes/No): YesNumber of Lumens (one/two): Two Upper Extremity Circumference (cm): 24Date Dressing Changed: 10/8/2024Date Lumen Connector/Cap Changed: 10/7/2024Blood Return (Yes/No): Yes - Positional at timesRecommended Care Guidelines:  1. Use aseptic technique for catheter care and use.  2. Check for a blood return prior to lumen use.  3. Instill 10 ml 0.9% normal saline for lumen flush.  4. Capped lumen: flush daily using 10 ml 0.9% normal saline.  5. Flush lumen post blood draw - 20 ml 0.9% normal saline.  6. Flush catheter lumen after each use to maintain patency.  7. Change dressing & securement device every 7 days and when dressing is wet, soiled or         not cclusive.  8. Change connector/cap every 4 days.  9. No blood pressure measurements or venipunctures using arm with a Midline catheter in       place.10. Recommend daily bathing using Chlorhexidine Gluconate 4% (antiseptic/antimicrobial skin        cleanser) while Midline catheter in place,      Plan of Care Overview/ Patient Status

## 2023-09-30 NOTE — Plan of Care
 Plan of Care Overview/ Patient Status    Pt is a&ox3, on room air, VSS, denies any SOB and pain. Assist x2 w/ hoyer lift, incont x2, purewick in place, contact precautions. Pt tol diet well. Midline catheter w/ double lumen; clean, dry and intact. Ileostomy in use and stool noted. Pt also has a colostomy which is not in use. Plan to d/c pt today at 11 am to [facility] by ambulance.D/c instructions given, verbalized understanding. Pt dc in stable conditions by ambulance at 1206. All belongings taken by the pt. Report given to [place] at [place] at 1216.

## 2023-09-30 NOTE — Plan of Care
 Plan of Care Overview/ Patient Status    Assumed care at 1900. A&Ox3, on room air, VSS, assist x2 w/ hoyer lift, incont x2, ileostomy, colostomy, purewick in place, contact precautions and FSACHS. PO meds whole. All meds taken by the [facility] and tolerated well.Midline catheter w/ double lumen; clean, dry and intact.Ileostomy in use and stool noted.Colostomy not in use; bandage over it. Redness blanchable on bottom. Cont D5 running at 137mL/hr and KVO running at 15mL/hr.Denies pain or discomfort. Refused scheduled Tylenol.All due care was given, bed at lowest position, call bell within reach, personal belongings at bedside, bed alarm on and safety precautions taken. Problem: Diabetes ComorbidityGoal: Blood Glucose Level Within Targeted RangeOutcome: Interventions implemented as appropriate Problem: Violence Risk or ActualGoal: Anger and Impulse ControlOutcome: Interventions implemented as appropriate Problem: Adult Inpatient Plan of CareGoal: Plan of Care ReviewOutcome: Interventions implemented as appropriateGoal: Patient-Specific Goal (Individualized)Outcome: Interventions implemented as appropriateGoal: Absence of Hospital-Acquired Illness or InjuryOutcome: Interventions implemented as appropriateGoal: Optimal Comfort and WellbeingOutcome: Interventions implemented as appropriateGoal: Readiness for Transition of CareOutcome: Interventions implemented as appropriate Problem: Fall Injury RiskGoal: Absence of Fall and Fall-Related InjuryOutcome: Interventions implemented as appropriate Problem: Wound Healing ProgressionGoal: Optimal Wound HealingOutcome: Interventions implemented as appropriate Problem: Skin Injury Risk IncreasedGoal: Skin Health and IntegrityOutcome: Interventions implemented as appropriate Problem: InfectionGoal: Absence of Infection Signs and SymptomsOutcome: Interventions implemented as appropriate Problem: Physical Therapy GoalsGoal: Physical Therapy GoalsDescription: 1) bed mob w/ minx12) txfrs w/ RW mod a x2Outcome: Interventions implemented as appropriate Problem: UTI (Urinary Tract Infection)Goal: Improved Infection SymptomsOutcome: Interventions implemented as appropriate Problem: Electrolyte ImbalanceGoal: Electrolyte Imbalance: Plan of CareOutcome: Interventions implemented as appropriate

## 2023-10-01 LAB — KIDNEY STONE ANALYSIS     (BH GH L LMW Q YMG)

## 2023-10-01 LAB — BLOOD CULTURE   (BH GH L LMW YH): BKR BLOOD CULTURE: NO GROWTH

## 2023-10-02 NOTE — Telephone Encounter
 Left another message to [facility] - will call one more time next week

## 2023-10-05 ENCOUNTER — Telehealth: Admit: 2023-10-05 | Payer: PRIVATE HEALTH INSURANCE | Primary: Nephrology

## 2023-10-05 ENCOUNTER — Other Ambulatory Visit: Admit: 2023-10-05 | Payer: MEDICARE | Attending: Nephrology | Primary: Nephrology

## 2023-10-05 ENCOUNTER — Telehealth: Admit: 2023-10-05 | Payer: PRIVATE HEALTH INSURANCE | Attending: Family | Primary: Nephrology

## 2023-10-05 DIAGNOSIS — Z932 Ileostomy status: Secondary | ICD-10-CM

## 2023-10-05 DIAGNOSIS — N200 Calculus of kidney: Secondary | ICD-10-CM

## 2023-10-05 DIAGNOSIS — K219 Gastro-esophageal reflux disease without esophagitis: Secondary | ICD-10-CM

## 2023-10-05 LAB — BASIC METABOLIC PANEL
BKR ANION GAP: 2 — ABNORMAL LOW (ref 5–18)
BKR BLOOD UREA NITROGEN: 7 mg/dL — ABNORMAL LOW (ref 8–25)
BKR BUN / CREAT RATIO: 9.7 (ref 8.0–25.0)
BKR CALCIUM: 10.1 mg/dL (ref 8.4–10.3)
BKR CHLORIDE: 113 mmol/L (ref 95–115)
BKR CO2: 26 mmol/L (ref 21–32)
BKR CREATININE: 0.72 mg/dL (ref 0.50–1.30)
BKR EGFR, CREATININE (CKD-EPI 2021): >60 mL/min/{1.73_m2} (ref >=60)
BKR GLUCOSE: 73 mg/dL (ref 70–100)
BKR OSMOLALITY CALCULATION: 278 mosm/kg (ref 275–295)
BKR POTASSIUM: 3.8 mmol/L (ref 3.5–5.1)
BKR SODIUM: 141 mmol/L (ref 136–145)

## 2023-10-05 LAB — CBC WITHOUT DIFFERENTIAL
BKR WAM ANC (ABSOLUTE NEUTROPHIL COUNT): 5.6 x 1000/ÂµL (ref 2.00–7.60)
BKR WAM HEMATOCRIT (2 DEC): 27.3 % — ABNORMAL LOW (ref 35.00–45.00)
BKR WAM HEMOGLOBIN: 8.3 g/dL — ABNORMAL LOW (ref 11.7–15.5)
BKR WAM MCH (PG): 27.3 pg (ref 27.0–33.0)
BKR WAM MCHC: 30.4 g/dL — ABNORMAL LOW (ref 31.0–36.0)
BKR WAM MCV: 89.8 fL (ref 80.0–100.0)
BKR WAM MPV: 10.3 fL (ref 8.0–12.0)
BKR WAM PLATELETS: 339 x1000/ÂµL (ref 150–420)
BKR WAM RDW-CV: 17.3 % — ABNORMAL HIGH (ref 11.0–15.0)
BKR WAM RED BLOOD CELL COUNT.: 3.04 M/ÂµL — ABNORMAL LOW (ref 4.00–6.00)
BKR WAM WHITE BLOOD CELL COUNT: 7.7 x1000/ÂµL (ref 4.0–11.0)

## 2023-10-05 NOTE — Telephone Encounter
 TC to [facility]- spoke with Okey Dupre. Per Okey Dupre, their facility is located in [place] and they do not transport patients to [place]. [facility] states that Arlicia will be staying with them permanently. She is scheduled with our APRN, Mirna Mires, this Thursday in [place]. I am wondering if that appt should be cancelled and patient should be transferred to [facility] permanently. It does appear she has already established care with Isaias Cowman, MD in [place]. Will forward this message to their office to see if they can set up a follow up visit in the near future and communicate with the facility.

## 2023-10-05 NOTE — Telephone Encounter
 Left message for patient's conservator, [name]. She did not answer when I called. Asked that she call [facility] to make an appt with Dr. Isaias Cowman.

## 2023-10-05 NOTE — Telephone Encounter
 Patient's representative, Hillary Bow, requests call back to patient's facility, [facility], at [phone] to inform of upcoming appointments. Hillary Bow reports she has been receiving updates but patient is not living with her and continues to miss appointments because of this

## 2023-10-06 NOTE — Telephone Encounter
 Set up 3 month appt. Called and LM on Rose's voicemail at [place] with appointment day and time. Asked to call back

## 2023-10-07 ENCOUNTER — Other Ambulatory Visit: Admit: 2023-10-07 | Payer: MEDICARE | Attending: Nephrology | Primary: Nephrology

## 2023-10-07 ENCOUNTER — Telehealth: Admit: 2023-10-07 | Payer: PRIVATE HEALTH INSURANCE | Attending: Medical Oncology | Primary: Nephrology

## 2023-10-07 DIAGNOSIS — G629 Polyneuropathy, unspecified: Secondary | ICD-10-CM

## 2023-10-07 DIAGNOSIS — N200 Calculus of kidney: Secondary | ICD-10-CM

## 2023-10-07 DIAGNOSIS — D649 Anemia, unspecified: Secondary | ICD-10-CM

## 2023-10-07 LAB — CBC WITH AUTO DIFFERENTIAL
BKR WAM ABSOLUTE IMMATURE GRANULOCYTES.: 0.01 x 1000/ÂµL (ref 0.00–0.30)
BKR WAM ABSOLUTE LYMPHOCYTE COUNT.: 1.13 x 1000/ÂµL (ref 0.60–3.70)
BKR WAM ABSOLUTE NRBC (2 DEC): 0 x 1000/ÂµL (ref 0.00–1.00)
BKR WAM ANC (ABSOLUTE NEUTROPHIL COUNT): 3.95 x 1000/ÂµL (ref 2.00–7.60)
BKR WAM BASOPHIL ABSOLUTE COUNT.: 0.04 x 1000/ÂµL (ref 0.00–1.00)
BKR WAM BASOPHILS: 0.7 % (ref 0.0–1.4)
BKR WAM EOSINOPHIL ABSOLUTE COUNT.: 0.12 x 1000/ÂµL (ref 0.00–1.00)
BKR WAM EOSINOPHILS: 2 % (ref 0.0–5.0)
BKR WAM HEMATOCRIT (2 DEC): 29.2 % — ABNORMAL LOW (ref 35.00–45.00)
BKR WAM HEMOGLOBIN: 8.8 g/dL — ABNORMAL LOW (ref 11.7–15.5)
BKR WAM IMMATURE GRANULOCYTES: 0.2 % (ref 0.0–1.0)
BKR WAM LYMPHOCYTES: 18.6 % (ref 17.0–50.0)
BKR WAM MCH (PG): 27.2 pg (ref 27.0–33.0)
BKR WAM MCHC: 30.1 g/dL — ABNORMAL LOW (ref 31.0–36.0)
BKR WAM MCV: 90.1 fL (ref 80.0–100.0)
BKR WAM MONOCYTE ABSOLUTE COUNT.: 0.82 x 1000/ÂµL (ref 0.00–1.00)
BKR WAM MONOCYTES: 13.5 % — ABNORMAL HIGH (ref 4.0–12.0)
BKR WAM MPV: 10.5 fL (ref 8.0–12.0)
BKR WAM NEUTROPHILS: 65 % (ref 39.0–72.0)
BKR WAM NUCLEATED RED BLOOD CELLS: 0 % (ref 0.0–1.0)
BKR WAM PLATELETS: 297 x1000/ÂµL (ref 150–420)
BKR WAM RDW-CV: 17 % — ABNORMAL HIGH (ref 11.0–15.0)
BKR WAM RED BLOOD CELL COUNT.: 3.24 M/ÂµL — ABNORMAL LOW (ref 4.00–6.00)
BKR WAM WHITE BLOOD CELL COUNT: 6.1 x1000/ÂµL (ref 4.0–11.0)

## 2023-10-07 LAB — COMPREHENSIVE METABOLIC PANEL
BKR A/G RATIO: 0.5
BKR ALANINE AMINOTRANSFERASE (ALT): 9 U/L — ABNORMAL LOW (ref 12–78)
BKR ALBUMIN: 2.5 g/dL — ABNORMAL LOW (ref 3.4–5.0)
BKR ALKALINE PHOSPHATASE: 59 U/L (ref 20–120)
BKR ANION GAP: 6 (ref 5–18)
BKR ASPARTATE AMINOTRANSFERASE (AST): 14 U/L (ref 5–37)
BKR AST/ALT RATIO: 1.6
BKR BILIRUBIN TOTAL: 0.2 mg/dL (ref 0.0–1.0)
BKR BLOOD UREA NITROGEN: 15 mg/dL (ref 8–25)
BKR BUN / CREAT RATIO: 16.3 (ref 8.0–25.0)
BKR CALCIUM: 9.7 mg/dL (ref 8.4–10.3)
BKR CHLORIDE: 111 mmol/L (ref 95–115)
BKR CO2: 22 mmol/L (ref 21–32)
BKR CREATININE: 0.92 mg/dL (ref 0.50–1.30)
BKR EGFR, CREATININE (CKD-EPI 2021): >60 mL/min/{1.73_m2} (ref >=60)
BKR GLOBULIN: 5.4 g/dL
BKR GLUCOSE: 78 mg/dL (ref 70–100)
BKR OSMOLALITY CALCULATION: 277 mosm/kg (ref 275–295)
BKR POTASSIUM: 4.1 mmol/L (ref 3.5–5.1)
BKR PROTEIN TOTAL: 7.9 g/dL (ref 6.4–8.2)
BKR SODIUM: 139 mmol/L (ref 136–145)

## 2023-10-07 NOTE — Telephone Encounter
 Returned call to [name]. Rescheduled for an earlier time as requested. Patient needs transportation from [facility] and they need to bring her sooner in the day)

## 2023-10-07 NOTE — Telephone Encounter
 Pt has an appointment on 1/21 would like to switch time to and earlier time.[phone]

## 2023-10-08 ENCOUNTER — Ambulatory Visit: Admit: 2023-10-08 | Payer: MEDICARE | Attending: Family | Primary: Nephrology

## 2023-10-08 ENCOUNTER — Other Ambulatory Visit: Admit: 2023-10-08 | Payer: PRIVATE HEALTH INSURANCE | Attending: Nephrology | Primary: Nephrology

## 2023-10-08 ENCOUNTER — Ambulatory Visit: Admit: 2023-10-08 | Payer: MEDICARE | Primary: Nephrology

## 2023-10-08 DIAGNOSIS — N390 Urinary tract infection, site not specified: Secondary | ICD-10-CM

## 2023-10-08 LAB — URINALYSIS-MACROSCOPIC W/REFLEX MICROSCOPIC
BKR BILIRUBIN, UA: NEGATIVE
BKR GLUCOSE, UA: NEGATIVE
BKR KETONES, UA: NEGATIVE
BKR NITRITE, UA: NEGATIVE
BKR PH, UA: 6 (ref 5.5–7.5)
BKR SPECIFIC GRAVITY, UA: 1.019 (ref 1.005–1.030)
BKR UROBILINOGEN, UA (MG/DL): <2 mg/dL (ref <=2.0)

## 2023-10-08 LAB — URINE MICROSCOPIC     (BH GH LMW YH)
BKR RBC/HPF INSTRUMENT: 224 /HPF — ABNORMAL HIGH (ref 0–2)
BKR URINE SQUAMOUS EPITHELIAL CELLS, UA (NUMERIC): <1 /HPF (ref 0–5)
BKR WBC/HPF INSTRUMENT: 22 /HPF — ABNORMAL HIGH (ref 0–5)

## 2023-10-08 NOTE — Telephone Encounter
 Deana at [facility] returned the call. She had her ringer off. Please call her back at [phone].

## 2023-10-08 NOTE — Telephone Encounter
 Spoke with Deanna APRN at [facility] and informed of message per MD. Danielle [name] understanding and will call if condition worsens. Message from MD:She is on abx not much to do She has a stent always will have rbc and wbc

## 2023-10-08 NOTE — Telephone Encounter
 Spoke with Jennette Kettle, APRN at [facility]. Danielle [name] states Danielle [name] has been running low grade temperature, highest temperature was 99.8 since 10/06/23. Patient is finishing her IV ertapenem today. Currently on IV fluids for soft blood pressure. Denies hematuria, flank pain or other urinary symptoms at this time. Patient repeated urine at [facility] yesterday, urine culture still in progress. UA results below. Danielle [name] asking Dr. Hulen Luster if there is anything else she needs to watch out for.Patient had surgery Procedure: Cystoscopy left ureteroscopy, laser lithotripsy, stone extraction, stent placement., removal left ureteral stent with Dr. Hulen Luster on 09/24/23 and is scheduled for cystoscopy with stent removal on 10/28/23. Urine microscopic     ([facility])Order: [id number] - Reflex for Order [id number] Status: Final result   Visible to patient: Yes (not seen)   Next appt: 10/28/2023 at 10:30 AM in Urology [name], MD)   Dx: Urinary tract infection, site not spe...  0 Result Notes        ComponentRef Range & Units 10/08/23  7:00 AM 09/21/23 11:35 AM 09/14/23  8:29 PM 07/10/23 11:00 AM 07/03/23  2:33 PM 06/26/23  5:29 PM 05/24/23  4:09 AM RBC/HPF, UA0 - 2 /HPF 224 High  293 High  54 High  279 High  515 High  0-2 122 High  WBC/HPF, UA0 - 5 /HPF 22 High  100 High  1,206 High  529 High  48 High  Many Abnormal  8 High  Bacteria, UANone-Rare /HPF Rare Rare Many Abnormal  Many Abnormal  Rare Few Abnormal  Rare Urine Squamous Epithelial Cells, UA0 - 5 /HPF <1 1 2 1  <1 Many Abnormal  R <1 Manual Microscopic      Performed  RBC/HPF, UA        WBC/HPF, UA        Resulting Agency [facility]

## 2023-10-08 NOTE — Telephone Encounter
 Will call rose again tomorrow

## 2023-10-08 NOTE — Telephone Encounter
 [facility] MESSAGETime of call:   10:53 AMCaller:   Clinical biochemist from (pharmacy, hospital, agency, etc.):  [facility] for call:  Danielle [name], is calling in to speak with a nurse/md regarding post op symptoms, Deana states that patient has been having a low grade fever, patient is on the last day of antibiotics. Please reach back out to discuss further. Does caller request to speak to someone urgently?  yes   Best telephone number for callback: [phone] Best time to return call:  anytimePermission to leave message:  yes   [facility]

## 2023-10-08 NOTE — Telephone Encounter
 Attempted to return call to [facility] at  [facility]. Left voicemail with callback number.

## 2023-10-09 ENCOUNTER — Other Ambulatory Visit: Admit: 2023-10-09 | Payer: PRIVATE HEALTH INSURANCE | Attending: Nephrology | Primary: Nephrology

## 2023-10-09 DIAGNOSIS — D649 Anemia, unspecified: Secondary | ICD-10-CM

## 2023-10-09 DIAGNOSIS — E785 Hyperlipidemia, unspecified: Secondary | ICD-10-CM

## 2023-10-09 DIAGNOSIS — G629 Polyneuropathy, unspecified: Secondary | ICD-10-CM

## 2023-10-09 LAB — CBC WITH AUTO DIFFERENTIAL
BKR WAM ABSOLUTE NRBC (2 DEC): 0 x 1000/ÂµL (ref 0.00–1.00)
BKR WAM HEMATOCRIT (2 DEC): 24 % — ABNORMAL LOW (ref 35.00–45.00)
BKR WAM HEMOGLOBIN: 7.1 g/dL — ABNORMAL LOW (ref 11.7–15.5)
BKR WAM MCH (PG): 26.9 pg — ABNORMAL LOW (ref 27.0–33.0)
BKR WAM MCHC: 29.6 g/dL — ABNORMAL LOW (ref 31.0–36.0)
BKR WAM MCV: 90.9 fL (ref 80.0–100.0)
BKR WAM MPV: 10.4 fL (ref 8.0–12.0)
BKR WAM NUCLEATED RED BLOOD CELLS: 0 % (ref 0.0–1.0)
BKR WAM PLATELETS: 296 x1000/ÂµL (ref 150–420)
BKR WAM RDW-CV: 16.7 % — ABNORMAL HIGH (ref 11.0–15.0)
BKR WAM RED BLOOD CELL COUNT.: 2.64 M/ÂµL — ABNORMAL LOW (ref 4.00–6.00)
BKR WAM WHITE BLOOD CELL COUNT: 3.8 x1000/ÂµL — ABNORMAL LOW (ref 4.0–11.0)

## 2023-10-09 LAB — URINE CULTURE: BKR URINE CULTURE, ROUTINE: NO GROWTH

## 2023-10-09 LAB — MANUAL DIFFERENTIAL
BKR WAM BANDS (DIFF) (1 DEC): 8.1 % (ref 0.0–10.0)
BKR WAM BASOPHIL - ABS (DIFF) 2 DEC: 0.03 x 1000/ÂµL (ref 0.00–1.00)
BKR WAM BASOPHILS (DIFF): 0.9 % (ref 0.0–1.4)
BKR WAM EOSINOPHILS (DIFF) 2 DEC: 0.38 x 1000/ÂµL (ref 0.00–1.00)
BKR WAM EOSINOPHILS (DIFF): 9.9 % — ABNORMAL HIGH (ref 0.0–5.0)
BKR WAM LYMPHOCYTE - ABS (DIFF) 2 DEC: 1.27 x 1000/ÂµL (ref 0.60–3.70)
BKR WAM LYMPHOCYTES (DIFF): 33.4 % (ref 17.0–50.0)
BKR WAM MONOCYTE - ABS (DIFF) 2 DEC: 0.61 x 1000/ÂµL (ref 0.00–1.00)
BKR WAM MONOCYTES (DIFF): 16.2 % — ABNORMAL HIGH (ref 4.0–12.0)
BKR WAM NEUTROPHILS (DIFF): 31.5 % — ABNORMAL LOW (ref 39.0–72.0)
BKR WAM NEUTROPHILS - ABS (DIFF) 2 DEC: 1.5 x 1000/ÂµL — ABNORMAL LOW (ref 2.00–7.60)
BKR WAM NORMAL RBC MORPHOLOGY: NORMAL

## 2023-10-09 LAB — BASIC METABOLIC PANEL
BKR ANION GAP: 3 — ABNORMAL LOW (ref 5–18)
BKR BLOOD UREA NITROGEN: 12 mg/dL (ref 8–25)
BKR BUN / CREAT RATIO: 14.5 (ref 8.0–25.0)
BKR CALCIUM: 9.3 mg/dL (ref 8.4–10.3)
BKR CHLORIDE: 117 mmol/L — ABNORMAL HIGH (ref 95–115)
BKR CO2: 23 mmol/L (ref 21–32)
BKR CREATININE: 0.83 mg/dL (ref 0.50–1.30)
BKR EGFR, CREATININE (CKD-EPI 2021): >60 mL/min/{1.73_m2} (ref >=60)
BKR GLUCOSE: 78 mg/dL (ref 70–100)
BKR OSMOLALITY CALCULATION: 284 mosm/kg (ref 275–295)
BKR POTASSIUM: 4 mmol/L (ref 3.5–5.1)
BKR SODIUM: 143 mmol/L (ref 136–145)

## 2023-10-13 ENCOUNTER — Other Ambulatory Visit: Admit: 2023-10-13 | Payer: MEDICARE | Attending: Nephrology | Primary: Nephrology

## 2023-10-13 DIAGNOSIS — D649 Anemia, unspecified: Secondary | ICD-10-CM

## 2023-10-13 DIAGNOSIS — M6281 Muscle weakness (generalized): Secondary | ICD-10-CM

## 2023-10-13 LAB — CBC WITH AUTO DIFFERENTIAL
BKR WAM ABSOLUTE IMMATURE GRANULOCYTES.: 0.01 x 1000/ÂµL (ref 0.00–0.30)
BKR WAM ABSOLUTE LYMPHOCYTE COUNT.: 1.76 x 1000/ÂµL (ref 0.60–3.70)
BKR WAM ABSOLUTE NRBC (2 DEC): 0 x 1000/ÂµL (ref 0.00–1.00)
BKR WAM ANC (ABSOLUTE NEUTROPHIL COUNT): 2.61 x 1000/ÂµL (ref 2.00–7.60)
BKR WAM BASOPHIL ABSOLUTE COUNT.: 0.05 x 1000/ÂµL (ref 0.00–1.00)
BKR WAM BASOPHILS: 0.9 % (ref 0.0–1.4)
BKR WAM EOSINOPHIL ABSOLUTE COUNT.: 0.4 x 1000/ÂµL (ref 0.00–1.00)
BKR WAM EOSINOPHILS: 7.5 % — ABNORMAL HIGH (ref 0.0–5.0)
BKR WAM HEMATOCRIT (2 DEC): 25 % — ABNORMAL LOW (ref 35.00–45.00)
BKR WAM HEMOGLOBIN: 7.9 g/dL — ABNORMAL LOW (ref 11.7–15.5)
BKR WAM IMMATURE GRANULOCYTES: 0.2 % (ref 0.0–1.0)
BKR WAM LYMPHOCYTES: 32.8 % (ref 17.0–50.0)
BKR WAM MCH (PG): 27.7 pg (ref 27.0–33.0)
BKR WAM MCHC: 31.6 g/dL (ref 31.0–36.0)
BKR WAM MCV: 87.7 fL (ref 80.0–100.0)
BKR WAM MONOCYTE ABSOLUTE COUNT.: 0.53 x 1000/ÂµL (ref 0.00–1.00)
BKR WAM MONOCYTES: 9.9 % (ref 4.0–12.0)
BKR WAM MPV: 10.1 fL (ref 8.0–12.0)
BKR WAM NEUTROPHILS: 48.7 % (ref 39.0–72.0)
BKR WAM NUCLEATED RED BLOOD CELLS: 0 % (ref 0.0–1.0)
BKR WAM PLATELETS: 433 x1000/ÂµL — ABNORMAL HIGH (ref 150–420)
BKR WAM RDW-CV: 16.9 % — ABNORMAL HIGH (ref 11.0–15.0)
BKR WAM RED BLOOD CELL COUNT.: 2.85 M/ÂµL — ABNORMAL LOW (ref 4.00–6.00)
BKR WAM WHITE BLOOD CELL COUNT: 5.4 x1000/ÂµL (ref 4.0–11.0)

## 2023-10-15 ENCOUNTER — Other Ambulatory Visit: Admit: 2023-10-15 | Payer: MEDICARE | Attending: Nephrology | Primary: Nephrology

## 2023-10-15 DIAGNOSIS — K219 Gastro-esophageal reflux disease without esophagitis: Secondary | ICD-10-CM

## 2023-10-15 DIAGNOSIS — G629 Polyneuropathy, unspecified: Secondary | ICD-10-CM

## 2023-10-15 DIAGNOSIS — D649 Anemia, unspecified: Secondary | ICD-10-CM

## 2023-10-15 LAB — CBC WITH AUTO DIFFERENTIAL
BKR WAM ABSOLUTE IMMATURE GRANULOCYTES.: 0.01 x 1000/ÂµL (ref 0.00–0.30)
BKR WAM ABSOLUTE LYMPHOCYTE COUNT.: 1.69 x 1000/ÂµL (ref 0.60–3.70)
BKR WAM ABSOLUTE NRBC (2 DEC): 0 x 1000/ÂµL (ref 0.00–1.00)
BKR WAM ANC (ABSOLUTE NEUTROPHIL COUNT): 2.39 x 1000/ÂµL (ref 2.00–7.60)
BKR WAM BASOPHIL ABSOLUTE COUNT.: 0.04 x 1000/ÂµL (ref 0.00–1.00)
BKR WAM BASOPHILS: 0.8 % (ref 0.0–1.4)
BKR WAM EOSINOPHIL ABSOLUTE COUNT.: 0.39 x 1000/ÂµL (ref 0.00–1.00)
BKR WAM EOSINOPHILS: 7.7 % — ABNORMAL HIGH (ref 0.0–5.0)
BKR WAM HEMATOCRIT (2 DEC): 25.5 % — ABNORMAL LOW (ref 35.00–45.00)
BKR WAM HEMOGLOBIN: 7.6 g/dL — ABNORMAL LOW (ref 11.7–15.5)
BKR WAM IMMATURE GRANULOCYTES: 0.2 % (ref 0.0–1.0)
BKR WAM LYMPHOCYTES: 33.3 % (ref 17.0–50.0)
BKR WAM MCH (PG): 26.3 pg — ABNORMAL LOW (ref 27.0–33.0)
BKR WAM MCHC: 29.8 g/dL — ABNORMAL LOW (ref 31.0–36.0)
BKR WAM MCV: 88.2 fL (ref 80.0–100.0)
BKR WAM MONOCYTE ABSOLUTE COUNT.: 0.56 x 1000/ÂµL (ref 0.00–1.00)
BKR WAM MONOCYTES: 11 % (ref 4.0–12.0)
BKR WAM MPV: 9.9 fL (ref 8.0–12.0)
BKR WAM NEUTROPHILS: 47 % (ref 39.0–72.0)
BKR WAM NUCLEATED RED BLOOD CELLS: 0 % (ref 0.0–1.0)
BKR WAM PLATELETS: 469 x1000/ÂµL — ABNORMAL HIGH (ref 150–420)
BKR WAM RDW-CV: 17.2 % — ABNORMAL HIGH (ref 11.0–15.0)
BKR WAM RED BLOOD CELL COUNT.: 2.89 M/ÂµL — ABNORMAL LOW (ref 4.00–6.00)
BKR WAM WHITE BLOOD CELL COUNT: 5.1 x1000/ÂµL (ref 4.0–11.0)

## 2023-10-16 ENCOUNTER — Other Ambulatory Visit: Admit: 2023-10-16 | Payer: MEDICARE | Attending: Nephrology | Primary: Nephrology

## 2023-10-16 DIAGNOSIS — G629 Polyneuropathy, unspecified: Secondary | ICD-10-CM

## 2023-10-16 DIAGNOSIS — D649 Anemia, unspecified: Secondary | ICD-10-CM

## 2023-10-16 DIAGNOSIS — I82413 Acute embolism and thrombosis of femoral vein, bilateral: Secondary | ICD-10-CM

## 2023-10-16 LAB — CBC WITH AUTO DIFFERENTIAL
BKR WAM ABSOLUTE IMMATURE GRANULOCYTES.: 0.01 x 1000/ÂµL (ref 0.00–0.30)
BKR WAM ABSOLUTE LYMPHOCYTE COUNT.: 1.78 x 1000/ÂµL (ref 0.60–3.70)
BKR WAM ABSOLUTE NRBC (2 DEC): 0 x 1000/ÂµL (ref 0.00–1.00)
BKR WAM ANC (ABSOLUTE NEUTROPHIL COUNT): 2.17 x 1000/ÂµL (ref 2.00–7.60)
BKR WAM BASOPHIL ABSOLUTE COUNT.: 0.05 x 1000/ÂµL (ref 0.00–1.00)
BKR WAM BASOPHILS: 1 % (ref 0.0–1.4)
BKR WAM EOSINOPHIL ABSOLUTE COUNT.: 0.5 x 1000/ÂµL (ref 0.00–1.00)
BKR WAM EOSINOPHILS: 9.6 % — ABNORMAL HIGH (ref 0.0–5.0)
BKR WAM HEMATOCRIT (2 DEC): 25.8 % — ABNORMAL LOW (ref 35.00–45.00)
BKR WAM HEMOGLOBIN: 8 g/dL — ABNORMAL LOW (ref 11.7–15.5)
BKR WAM IMMATURE GRANULOCYTES: 0.2 % (ref 0.0–1.0)
BKR WAM LYMPHOCYTES: 34.3 % (ref 17.0–50.0)
BKR WAM MCH (PG): 27.7 pg (ref 27.0–33.0)
BKR WAM MCHC: 31 g/dL (ref 31.0–36.0)
BKR WAM MCV: 89.3 fL (ref 80.0–100.0)
BKR WAM MONOCYTE ABSOLUTE COUNT.: 0.68 x 1000/ÂµL (ref 0.00–1.00)
BKR WAM MONOCYTES: 13.1 % — ABNORMAL HIGH (ref 4.0–12.0)
BKR WAM MPV: 9.9 fL (ref 8.0–12.0)
BKR WAM NEUTROPHILS: 41.8 % (ref 39.0–72.0)
BKR WAM NUCLEATED RED BLOOD CELLS: 0 % (ref 0.0–1.0)
BKR WAM PLATELETS: 441 x1000/ÂµL — ABNORMAL HIGH (ref 150–420)
BKR WAM RDW-CV: 17.2 % — ABNORMAL HIGH (ref 11.0–15.0)
BKR WAM RED BLOOD CELL COUNT.: 2.89 M/ÂµL — ABNORMAL LOW (ref 4.00–6.00)
BKR WAM WHITE BLOOD CELL COUNT: 5.2 x1000/ÂµL (ref 4.0–11.0)

## 2023-10-23 NOTE — Patient Instructions
 Patient Education Cystoscopy Discharge Instructions About this topic Your kidneys make urine. It is stored in your bladder. The urethra is a tube at the bottom of the bladder. Urine flows out of this tube. Sometimes, there is a blockage and urine is not able to leave the body.A cystoscopy is a procedure that lets the doctor see the inside of your bladder and urethra. The doctor does it XB:JYNW for stones or tumors blocking the bladder and urethraLook for changes or injury inside the bladderTake a tissue sample from the inside of your bladderLook for reasons for blood in the urine, pain with urination, or why you are passing urine oftenLook for prostate problems What care is needed at home? Ask your doctor what you need to do when you go home. Make sure you ask questions if you do not understand what the doctor says. This way you will know what you need to do.Take a warm bath or use a warm wet washcloth over the opening to the urethra. This will help to ease any pain. Do this as needed.Drink 6 to 8 glasses of water a day and 3 to 4 glasses in the first few hours after the procedure to flush out your bladder and reduce irritation.You may see some blood in your urine for a few days. This is normal.Empty your bladder as soon as you feel the need to. Don't delay going to the bathroom. It stretches and weakens the bladder.What follow-up care is needed? Your doctor may ask you to make visits to the office to check on your progress. Be sure to keep these visits.If you had a biopsy, talk with your doctor about the results.What drugs may be needed? The doctor may order drugs GN:FAOZ with painFight an infectionHelp with bladder spasmsWill physical activity be limited? Talk to your doctor about when you may go back to your normal activities like work, driving, or sex.What problems could happen? BleedingInfectionInjury to the bladder and urethraDiscomfort in the urethra areaBurning sensation for a short timeUpset stomachWhen do I need to call the doctor? Signs of infection. These include a fever of 100.4?F (38?C) or higher, chills, pain with passing urine.Pain that does not go away even with drugs or that lasts longer than 2 daysToo much blood in your urinePassing large dime-sized clotsCloudy urineLittle or no urine or not able to pass urineAbdominal pain and nauseaTeach Back: Helping You Understand The Teach Back Method helps you understand the information we are giving you. After you talk with the staff, tell them in your own words what you learned. This helps to make sure the staff has described each thing clearly. It also helps to explain things that may have been confusing. Before going home, make sure you can do these:I can tell you about my procedure.I can tell you what may help ease my pain.I can tell you what I will do if I have a fever, chills, or am not able to pass urine.Where can I learn more? American Cancer Societyhttps://www.cancer.org/treatment/understanding-your-diagnosis/tests/endoscopy/cystoscopy.html Cancer Research ktimeonline.com NHS Choiceshttp://www.nhs.uk/conditions/Cystoscopy/Pages/Introduction.aspx Last Reviewed Date 2021-04-22Consumer Information Use and Disclaimer This generalized information is a limited summary of diagnosis, treatment, and/or medication information. It is not meant to be comprehensive and should be used as a tool to help the user understand and/or assess potential diagnostic and treatment options. It does NOT include all information about conditions, treatments, medications, side effects, or risks that may apply to a specific patient. It is not intended to be medical advice or a substitute for the medical advice, diagnosis, or  treatment of a health care provider based on the health care provider's examination and assessment of a patient?s specific and unique circumstances. Patients must speak with a health care provider for complete information about their health, medical questions, and treatment options, including any risks or benefits regarding use of medications. This information does not endorse any treatments or medications as safe, effective, or approved for treating a specific patient. UpToDate, Inc. and its affiliates disclaim any warranty or liability relating to this information or the use thereof. The use of this information is governed by the Terms of Use, available at https://www.wolterskluwer.com/en/know/clinical-effectiveness-terms Copyright Copyright ? 2023 UpToDate, Inc. and its affiliates and/or licensors. All rights reserved.

## 2023-10-27 ENCOUNTER — Other Ambulatory Visit: Admit: 2023-10-27 | Payer: PRIVATE HEALTH INSURANCE | Attending: Nephrology | Primary: Nephrology

## 2023-10-27 ENCOUNTER — Telehealth: Admit: 2023-10-27 | Payer: PRIVATE HEALTH INSURANCE | Attending: Urology | Primary: Nephrology

## 2023-10-27 DIAGNOSIS — D649 Anemia, unspecified: Secondary | ICD-10-CM

## 2023-10-27 LAB — CBC WITH AUTO DIFFERENTIAL
BKR WAM ABSOLUTE IMMATURE GRANULOCYTES.: 0.01 x 1000/ÂµL (ref 0.00–0.30)
BKR WAM ABSOLUTE LYMPHOCYTE COUNT.: 1.63 x 1000/ÂµL (ref 0.60–3.70)
BKR WAM ABSOLUTE NRBC (2 DEC): 0 x 1000/ÂµL (ref 0.00–1.00)
BKR WAM ANC (ABSOLUTE NEUTROPHIL COUNT): 2.97 x 1000/ÂµL (ref 2.00–7.60)
BKR WAM BASOPHIL ABSOLUTE COUNT.: 0.06 x 1000/ÂµL (ref 0.00–1.00)
BKR WAM BASOPHILS: 1.1 % (ref 0.0–1.4)
BKR WAM EOSINOPHIL ABSOLUTE COUNT.: 0.28 x 1000/ÂµL (ref 0.00–1.00)
BKR WAM EOSINOPHILS: 5.1 % — ABNORMAL HIGH (ref 0.0–5.0)
BKR WAM HEMATOCRIT (2 DEC): 36.2 % (ref 35.00–45.00)
BKR WAM HEMOGLOBIN: 10.5 g/dL — ABNORMAL LOW (ref 11.7–15.5)
BKR WAM IMMATURE GRANULOCYTES: 0.2 % (ref 0.0–1.0)
BKR WAM LYMPHOCYTES: 29.5 % (ref 17.0–50.0)
BKR WAM MCH (PG): 27.2 pg (ref 27.0–33.0)
BKR WAM MCHC: 29 g/dL — ABNORMAL LOW (ref 31.0–36.0)
BKR WAM MCV: 93.8 fL (ref 80.0–100.0)
BKR WAM MONOCYTE ABSOLUTE COUNT.: 0.57 x 1000/ÂµL (ref 0.00–1.00)
BKR WAM MONOCYTES: 10.3 % (ref 4.0–12.0)
BKR WAM MPV: 11.2 fL (ref 8.0–12.0)
BKR WAM NEUTROPHILS: 53.8 % (ref 39.0–72.0)
BKR WAM NUCLEATED RED BLOOD CELLS: 0 % (ref 0.0–1.0)
BKR WAM PLATELETS: 298 x1000/ÂµL (ref 150–420)
BKR WAM RDW-CV: 17.6 % — ABNORMAL HIGH (ref 11.0–15.0)
BKR WAM RED BLOOD CELL COUNT.: 3.86 M/ÂµL — ABNORMAL LOW (ref 4.00–6.00)
BKR WAM WHITE BLOOD CELL COUNT: 5.5 x1000/ÂµL (ref 4.0–11.0)

## 2023-10-27 LAB — URINE MICROSCOPIC     (BH GH LMW YH)
BKR RBC/HPF INSTRUMENT: 26 /HPF — ABNORMAL HIGH (ref 0–2)
BKR URINE SQUAMOUS EPITHELIAL CELLS, UA (NUMERIC): 1 /HPF (ref 0–5)
BKR WBC/HPF INSTRUMENT: 278 /HPF — ABNORMAL HIGH (ref 0–5)

## 2023-10-27 LAB — URINALYSIS-MACROSCOPIC W/REFLEX MICROSCOPIC
BKR BILIRUBIN, UA: NEGATIVE
BKR GLUCOSE, UA: NEGATIVE
BKR KETONES, UA: NEGATIVE
BKR NITRITE, UA: POSITIVE — AB
BKR PH, UA: 8.5 — ABNORMAL HIGH (ref 5.5–7.5)
BKR SPECIFIC GRAVITY, UA: 1.016 (ref 1.005–1.030)
BKR UROBILINOGEN, UA (MG/DL): <2 mg/dL (ref <=2.0)

## 2023-10-27 MED ORDER — LIDOCAINE 2 % MUCOSAL JELLY IN APPLICATOR
2 | Freq: Once | URETHRAL | Status: CP
Start: 2023-10-27 — End: ?
  Administered 2023-10-28: 16:00:00 2 mL via URETHRAL

## 2023-10-27 MED ORDER — FOSFOMYCIN TROMETHAMINE 3 GRAM ORAL PACKET
3 | Freq: Once | ORAL | Status: CP
Start: 2023-10-27 — End: ?
  Administered 2023-10-28: 16:00:00 3 gram via ORAL

## 2023-10-27 NOTE — Telephone Encounter
 Spoke with conservator [name]) and she confirmed she will be here tomorrow at 10:15 am with Mrs. Gholson for her cystoscopy stent removal.

## 2023-10-27 NOTE — Telephone Encounter
-----   Message from [name], MD sent at 10/27/2023 11:15 AM EST -----Patient has a conservator story make sure she comes with her tomorrow or I need to talk to her today let me know

## 2023-10-28 ENCOUNTER — Encounter: Admit: 2023-10-28 | Payer: MEDICARE | Attending: Urology | Primary: Nephrology

## 2023-10-28 ENCOUNTER — Encounter: Admit: 2023-10-28 | Payer: PRIVATE HEALTH INSURANCE | Attending: Urology | Primary: Nephrology

## 2023-10-28 ENCOUNTER — Ambulatory Visit: Admit: 2023-10-28 | Payer: MEDICARE | Attending: Urology | Primary: Nephrology

## 2023-10-28 VITALS — BP 106/61 | HR 62 | Temp 98.40000°F | Ht 65.0 in | Wt 130.0 lb

## 2023-10-28 DIAGNOSIS — I82419 Acute embolism and thrombosis of unspecified femoral vein: Secondary | ICD-10-CM

## 2023-10-28 DIAGNOSIS — E099 Drug or chemical induced diabetes mellitus without complications: Secondary | ICD-10-CM

## 2023-10-28 DIAGNOSIS — M069 Rheumatoid arthritis, unspecified: Secondary | ICD-10-CM

## 2023-10-28 DIAGNOSIS — G9340 Encephalopathy, unspecified: Secondary | ICD-10-CM

## 2023-10-28 DIAGNOSIS — K219 Gastro-esophageal reflux disease without esophagitis: Secondary | ICD-10-CM

## 2023-10-28 DIAGNOSIS — K5792 Diverticulitis of intestine, part unspecified, without perforation or abscess without bleeding: Secondary | ICD-10-CM

## 2023-10-28 DIAGNOSIS — R1310 Dysphagia, unspecified: Secondary | ICD-10-CM

## 2023-10-28 DIAGNOSIS — N135 Crossing vessel and stricture of ureter without hydronephrosis: Secondary | ICD-10-CM

## 2023-10-28 DIAGNOSIS — R627 Adult failure to thrive: Secondary | ICD-10-CM

## 2023-10-28 DIAGNOSIS — L03115 Cellulitis of right lower limb: Secondary | ICD-10-CM

## 2023-10-28 DIAGNOSIS — Z932 Ileostomy status: Secondary | ICD-10-CM

## 2023-10-28 DIAGNOSIS — A499 Bacterial infection, unspecified: Secondary | ICD-10-CM

## 2023-10-28 DIAGNOSIS — N201 Calculus of ureter: Secondary | ICD-10-CM

## 2023-10-28 DIAGNOSIS — N399 Disorder of urinary system, unspecified: Secondary | ICD-10-CM

## 2023-10-28 DIAGNOSIS — G8220 Paraplegia, unspecified: Secondary | ICD-10-CM

## 2023-10-28 DIAGNOSIS — D649 Anemia, unspecified: Secondary | ICD-10-CM

## 2023-10-28 DIAGNOSIS — R32 Unspecified urinary incontinence: Secondary | ICD-10-CM

## 2023-10-28 LAB — URINALYSIS-MACROSCOPIC W/REFLEX MICROSCOPIC
BKR BILIRUBIN, UA: NEGATIVE
BKR GLUCOSE, UA: NEGATIVE
BKR KETONES, UA: NEGATIVE
BKR NITRITE, UA: NEGATIVE
BKR PH, UA: 7.5 (ref 5.5–7.5)
BKR SPECIFIC GRAVITY, UA: 1.014 (ref 1.005–1.030)
BKR UROBILINOGEN, UA (MG/DL): <2 mg/dL (ref <=2.0)

## 2023-10-28 LAB — URINE MICROSCOPIC     (BH GH LMW YH)
BKR RBC/HPF INSTRUMENT: 3 /HPF — ABNORMAL HIGH (ref 0–2)
BKR URINE SQUAMOUS EPITHELIAL CELLS, UA (NUMERIC): <1 /HPF (ref 0–5)
BKR WBC/HPF INSTRUMENT: 231 /HPF — ABNORMAL HIGH (ref 0–5)

## 2023-10-28 MED ORDER — POTASSIUM CITRATE ER 15 MEQ (1,620 MG) TABLET,EXTENDED RELEASE
15 | ORAL_TABLET | Freq: Two times a day (BID) | ORAL | 4 refills | Status: AC
Start: 2023-10-28 — End: ?

## 2023-10-28 MED ORDER — TAMSULOSIN 0.4 MG CAPSULE
0.4 | Status: AC
Start: 2023-10-28 — End: 2023-10-28

## 2023-10-28 MED ORDER — SODIUM CHLORIDE 0.45 % INTRAVENOUS SOLUTION
0.45 | Status: AC
Start: 2023-10-28 — End: ?

## 2023-10-28 MED ORDER — NITROFURANTOIN MACROCRYSTAL 50 MG CAPSULE
50 | ORAL_CAPSULE | Freq: Every day | ORAL | 1 refills | Status: AC
Start: 2023-10-28 — End: ?

## 2023-10-28 MED ORDER — SODIUM CHLORIDE 0.9 % INTRAVENOUS SOLUTION
Status: AC
Start: 2023-10-28 — End: ?

## 2023-10-28 MED ORDER — ERTAPENEM 1 GRAM SOLUTION FOR INJECTION
1 gram | Status: AC
Start: 2023-10-28 — End: 2023-11-02

## 2023-10-28 MED ORDER — FLUCONAZOLE 150 MG TABLET
150 | Status: AC
Start: 2023-10-28 — End: ?

## 2023-10-28 NOTE — Progress Notes
 Sp urs stent x2 patient with dense mid ureteral stricture  HPICrystal A [name] is a 67 y.o. female  status post bilateral stent placement for urosepsisPatient had passed a right ureteral stone has not asymptomatic left ureteral stonePatient more alert todayUrine blood-tinged07/07/2023 patient looks reasonably well with a clear understanding what is going onPatient with Klebsiella and urine of presses urine blood-tinged improving\WBC down to 7.9 from 22 creatinine 0.757/11/2023 sp bilateral stent passed stone r uretr has left midOn ceftin sepsis PAR for urs on left remove stent rightUa wbc  09/15/2023 patient is status post bilateral ureteral obstruction urgent bilateral stent placedPatient did have right ureteral stone Past had a difficult ureteroscopy on the left side secondary to 4 mm stone in the mid proximal ureterCurrently has stent placed since middle of JulyAdmitted for fever sepsisHopefully during this hospitalization will require ureteroscopy probably next week and recommendations to follow10/01/2023 schedle cysto urs in am PAR all discussed10/03/2023 sp urs with stone extraction Pt no issues but wbc 46!!No fever low BPLytes normalCt some mild fluid and mild hydro stent good position fluid around kidney11/05/2023 lengthy discussion concerning with the patient and conservativelyPatient with a mid ureteral stricture complicated with ureteral calculiHas had 2 ureteroscopy is both very difficult suspect patient has significant stricture in the mid ureterAll options were discussed with the patient and family including removing the stent in evaluatedMay we may require percutaneous and reconstructionOr we can change the stent every 3-6 months without issueCystoscopy to see calcifications after 1 month todayCystoscopy showed minimal calcification after 1 month HPI Past Medical HistoryPast Medical History    Past Medical History: Diagnosis Date  Acute embolism and thrombosis of unspecified femoral vein (HC Code)    Anemia    Cellulitis and abscess of right lower extremity    Dysphagia    Encephalopathy    GERD (gastroesophageal reflux disease)    Hypercalcemia    Paraplegia (HC Code)    Rheumatoid arthritis (HC Code)     Past Surgical HistoryPast Surgical History     Past Surgical History: Procedure Laterality Date  BAND HEMORRHOIDECTOMY      CYSTECTOMY   1972  LAPAROSCOPIC SALPINGOOPHERECTOMY   1986  LAPAROTOMY   2022  PARTIAL COLECTOMY   2022  PEG TUBE PLACEMENT   01/2022  UPPER GASTROINTESTINAL ENDOSCOPY       AllergiesNo Known [email or web address] @ Social HistorySocial History     Tobacco Use  Smoking status: Never  Smokeless tobacco: Never  Family History No family history on file. Review of SystemsNo symptoms beyond those noted in HPI and PMH- all other systems are negative Physical ExamBP 103/66  - Pulse 78  - Temp 99.8 ?F (37.7 ?C) (Temporal)  - Resp 20  - Ht 5' 10 (1.778 m)  - Wt 65.6 kg  - SpO2 95%  - BMI 20.75 kg/m?      Lab Results Component Value Date   WBC 7.9 06/29/2023   HGB 9.1 (L) 06/29/2023   HCT 28.80 (L) 06/29/2023   PLT 176 06/29/2023   ALT 8 (L) 06/29/2023   AST 9 06/29/2023   NA 144 06/29/2023   K 3.2 (L) 06/29/2023   CL 116 (H) 06/29/2023   CREATININE 0.75 06/29/2023   BUN 9 06/29/2023   CO2 23 06/29/2023   TSH 0.527 03/09/2023   INR 1.08 06/28/2023   GLU 88 06/29/2023   HGBA1C 5.5 03/09/2023   No results found for: UCOLOR, UGLUCOSE, UKETONE, USPECGRAVITY, UPH, UPROTEIN, UNITRATES, UBLOOD, ULEUKOCYTES, UA  No results found for: PVRPOCT  Previous resultsNo results found for: TESTOSTERONE  AUA Symptom Score today:       No data to display       11/06/2025Using a sterile technique and prep, the patient was draped in the dorsal lithotomy position, video cystoscopy was performed using the flexible cystoscope and sterile water. Residual Urine: MinimalVagina: Normal appearing vagina without lesions or dischargeUrethra: No abnormalities of the urethra are noted. Bladder: Bladder shows no abnormalities. No trabeculation. No foreign bodies. No evidence of tumor or stones. Normal bladder expansion. Ureter: Clear efflux noted both orifices. Orifices normal configuration and location. The cystoscope was removed. The patient tolerated the procedure well. Left stent seen minimally calcifications PATHOLOGY AND BIOPSIES   Patient with multiple issues left renal stone left ureteral stricture lengthy discussion with family    Assessment & Plan: Danielle [name] is a 67 y.o  left ureteral stricture managed with indwelling stentsHigh-risk for reconstructive surgeryWill begin potassium citrate to prevent calcificationCystoscopy in 6 weeksWill eventually need stent replacement in 3 monthsFosfomycin givenUrocit-K 15 twice a day KUB prior suspect will need cysto stent change December a fish FebruaryBegin daily Macrodantin

## 2023-10-28 NOTE — Progress Notes
 Danielle [name] is here today for a cystoscopy and possible stent removal.  Patient and sister [name] conservator informed of procedure and questions answered.  Instructions for pre procedure given to patient.  Written consent and time out obtained. Patient straight cathed for urine specimen.    Patient prepped according to protocol.  Laterality of stent confirmed on the left side.  Tolerated procedure well.  VSS.  Post procedure instructions reviewed with patient. No stent removed at this time per Dr Hulen Luster patient to return in 6 weeks for another cystoscopy and possible stent removal.  Fosfomycin given per protocol.

## 2023-10-28 NOTE — Telephone Encounter
 Spoke with Pharmacist, hospital at [facility]. Danielle [name] unsure of what happened at appointment today. Per note, stent was not removed today and will have a cystoscopy with stent removal in 6 weeks. Clarified nitrofurantoin order. Danielle [name] verbalizes understanding and will call with questions or concerns.  Disp Refills Start End nitrofurantoin (MACRODANTIN) 50 mg capsule 90 capsule 0 10/28/2023 01/26/2024 Take 1 capsule (50 mg total) by mouth daily. - Oral Per MD note:Danielle [name] is a 67 y.o  left ureteral stricture managed with indwelling stentsHigh-risk for reconstructive surgeryWill begin potassium citrate to prevent calcificationCystoscopy in 6 weeksWill eventually need stent replacement in 3 monthsFosfomycin givenUrocit-K 15 twice a day KUB prior suspect will need cysto stent change December a fish FebruaryBegin daily Macrodantin

## 2023-10-28 NOTE — Progress Notes
 Time Out DocumentationProcedure Name: Cytoscopy/stent removalProcedure Time: 10:36AM Team Members Present: Jon Billings, RN, [name], MDTime Out initiated after patient positioned & prior to beginning of procedure: YesName of Patient  & MRUN # or DOB stated and matches ID band or previously confirmed med record number: YesProceduralist states or confirms procedure to be performed: YesProcedural consent is used to verify procedure performed  & matches pt identifiers:YesLaterality of stent confirmed & matches Implant record:  Yes on the left sideNumber of stents removed & matches Implant record: None removed per MD Return in 6 weeks Site of procedure(s) (with laterality or level) is topically marked per policy & visible after draping:N/A Final check completed on all specimens: not applicableSerial number of device: : [id number]

## 2023-10-28 NOTE — Telephone Encounter
 Copied from CRM #[id number]. Topic: General Message - [facility]>> Oct 28, 2023  3:02 PM wrote:[facility]ENTER MESSAGETime of call:   3:02 PMCaller:   Deanna- Surveyor, minerals from (pharmacy, hospital, agency, etc.):  [facility]    Reason for call:   Deanne from [facility] nursing called wanting some clarification on patients nitrofurantoin (MACRODANTIN). She understands that it is to be taken daily, but would like to to know how long patient should be taking it. Please advise. Best telephone number for callback:   [phone]

## 2023-10-30 NOTE — Telephone Encounter
 Multiple numbers attempted. No answer.

## 2023-10-31 LAB — URINE CULTURE: BKR URINE CULTURE, ROUTINE: >=100000 — AB

## 2023-11-02 ENCOUNTER — Telehealth: Admit: 2023-11-02 | Payer: PRIVATE HEALTH INSURANCE | Attending: Urology | Primary: Nephrology

## 2023-11-02 ENCOUNTER — Encounter: Admit: 2023-11-02 | Payer: PRIVATE HEALTH INSURANCE | Attending: Urology | Primary: Nephrology

## 2023-11-02 MED ORDER — CEPHALEXIN 500 MG CAPSULE
500 | ORAL_CAPSULE | Freq: Three times a day (TID) | ORAL | 1 refills | Status: AC
Start: 2023-11-02 — End: ?

## 2023-11-02 NOTE — Telephone Encounter
-----   Message from [place] C sent at 11/02/2023  7:19 AM EST -----Urine cx postive, complete keflex course as prescribed----- Message -----From: Lab, Background UserSent: 10/29/2023  10:58 PM ESTTo: Orlie Dakin Uro Abnl Urine Culture Result Pool

## 2023-11-02 NOTE — Telephone Encounter
 Called Danielle [name]. Spoke with Seward Grater, nurse where pt resides @ [facility]. Relayed message stated below.

## 2023-11-03 NOTE — Telephone Encounter
 Spoke with Danielle [name] at [facility]. Informed of message from APRN:The nitrofurantoin patient is on is for uti prevention.  Protocol is to always stop preventative abx while being treated for active infection and then restart preventative abx upon completion of abx course.  So for this patient stop nitrofurantoin while on keflex and restart upon completion of keflex course. Clarified dosage of keflex. Maggie verbalizes understanding and thanked for the call. Disp Refills Start End  cephALEXin (KEFLEX) 500 mg capsule 21 capsule 0 11/02/2023 11/09/2023

## 2023-11-03 NOTE — Telephone Encounter
 Spoke with [employer] and informed of plan to take keflex starting today, and resume nitrofurantoin once keflex is finished. Informed Wilmarie that Clinical research associate also spoke with Micah Flesher who is also aware of plan. Patient verbalizes understanding. Confirmed appointment on 12/04/23.

## 2023-11-26 ENCOUNTER — Telehealth: Admit: 2023-11-26 | Payer: PRIVATE HEALTH INSURANCE | Attending: Urology | Primary: Nephrology

## 2023-11-26 NOTE — Telephone Encounter
 Danielle [name] has upcoming appointment on 12/04/2023. Is listed as cystoscopy, however had the procedure in November. Did not have KUB either.Needs call to clarify

## 2023-11-27 ENCOUNTER — Encounter: Admit: 2023-11-27 | Payer: PRIVATE HEALTH INSURANCE

## 2023-11-27 NOTE — Telephone Encounter
 Spoke with Rose who will bring patient [facility]

## 2023-11-27 NOTE — Telephone Encounter
 There was a previous message from rn to change fu in December to cysto, it was previously scheduled as fu, if needs to be change let me know. And I will make sure to get in contact with rose to bring patient to do xray prior appointment.

## 2023-11-27 NOTE — Telephone Encounter
 Pt needs kub before next appointment, Last note states :Cystoscopy in 6 weeks I would leave as cysto, sent pt a my chart message to have KUBTried calling patient she is at work, just LM with colleague to have pt check her my chart

## 2023-11-27 NOTE — Telephone Encounter
thanks

## 2023-12-04 ENCOUNTER — Encounter: Admit: 2023-12-04 | Payer: PRIVATE HEALTH INSURANCE | Attending: Urology

## 2024-01-04 ENCOUNTER — Ambulatory Visit: Admit: 2024-01-04 | Payer: MEDICARE

## 2024-01-07 ENCOUNTER — Emergency Department: Admit: 2024-01-07 | Payer: BLUE CROSS/BLUE SHIELD | Primary: Nephrology

## 2024-01-07 ENCOUNTER — Inpatient Hospital Stay
Admit: 2024-01-07 | Discharge: 2024-01-15 | Payer: BLUE CROSS/BLUE SHIELD | Attending: Student in an Organized Health Care Education/Training Program | Admitting: Student in an Organized Health Care Education/Training Program

## 2024-01-07 LAB — PROTIME AND INR
BKR INR: 1.08 (ref 0.91–1.18)
BKR PROTHROMBIN TIME: 11.1 s (ref 9.4–12.0)

## 2024-01-07 LAB — HEPATIC FUNCTION PANEL
BKR A/G RATIO: 0.7
BKR ALANINE AMINOTRANSFERASE (ALT): 10 U/L — ABNORMAL LOW (ref 12–78)
BKR ALBUMIN: 3 g/dL — ABNORMAL LOW (ref 3.4–5.0)
BKR ALKALINE PHOSPHATASE: 84 U/L (ref 20–120)
BKR ASPARTATE AMINOTRANSFERASE (AST): 16 U/L (ref 5–37)
BKR AST/ALT RATIO: 1.6
BKR BILIRUBIN DIRECT: <0.1 mg/dL (ref 0.0–0.3)
BKR BILIRUBIN TOTAL: 0.1 mg/dL (ref 0.0–1.0)
BKR BILIRUBIN, INDIRECT: >0 mg/dL
BKR GLOBULIN: 4.6 g/dL
BKR PROTEIN TOTAL: 7.6 g/dL (ref 6.4–8.2)

## 2024-01-07 LAB — URINALYSIS WITH CULTURE REFLEX      (BH LMW YH)
BKR BILIRUBIN, UA: NEGATIVE
BKR BLOOD, UA: NEGATIVE
BKR GLUCOSE, UA: NEGATIVE
BKR KETONES, UA: NEGATIVE
BKR NITRITE, UA: NEGATIVE
BKR PH, UA: 7 (ref 5.5–7.5)
BKR PROTEIN, UA: NEGATIVE
BKR SPECIFIC GRAVITY, UA: 1.011 (ref 1.005–1.030)
BKR UROBILINOGEN, UA (MG/DL): <2 mg/dL (ref <=2.0)

## 2024-01-07 LAB — CBC WITH AUTO DIFFERENTIAL
BKR WAM ABSOLUTE IMMATURE GRANULOCYTES.: 0.03 x 1000/ÂµL (ref 0.00–0.30)
BKR WAM ABSOLUTE LYMPHOCYTE COUNT.: 0.69 x 1000/ÂµL (ref 0.60–3.70)
BKR WAM ABSOLUTE NRBC (2 DEC): 0 x 1000/ÂµL (ref 0.00–1.00)
BKR WAM ANC (ABSOLUTE NEUTROPHIL COUNT): 6.96 x 1000/ÂµL (ref 2.00–7.60)
BKR WAM BASOPHIL ABSOLUTE COUNT.: 0.03 x 1000/ÂµL (ref 0.00–1.00)
BKR WAM BASOPHILS: 0.4 % (ref 0.0–1.4)
BKR WAM EOSINOPHIL ABSOLUTE COUNT.: 0.04 x 1000/ÂµL (ref 0.00–1.00)
BKR WAM EOSINOPHILS: 0.5 % (ref 0.0–5.0)
BKR WAM HEMATOCRIT (2 DEC): 34.3 % — ABNORMAL LOW (ref 35.00–45.00)
BKR WAM HEMOGLOBIN: 10.7 g/dL — ABNORMAL LOW (ref 11.7–15.5)
BKR WAM IMMATURE GRANULOCYTES: 0.4 % (ref 0.0–1.0)
BKR WAM LYMPHOCYTES: 8.2 % — ABNORMAL LOW (ref 17.0–50.0)
BKR WAM MCH (PG): 27.9 pg (ref 27.0–33.0)
BKR WAM MCHC: 31.2 g/dL (ref 31.0–36.0)
BKR WAM MCV: 89.3 fL (ref 80.0–100.0)
BKR WAM MONOCYTE ABSOLUTE COUNT.: 0.67 x 1000/ÂµL (ref 0.00–1.00)
BKR WAM MONOCYTES: 8 % (ref 4.0–12.0)
BKR WAM MPV: 10.1 fL (ref 8.0–12.0)
BKR WAM NEUTROPHILS: 82.5 % — ABNORMAL HIGH (ref 39.0–72.0)
BKR WAM NUCLEATED RED BLOOD CELLS: 0 % (ref 0.0–1.0)
BKR WAM PLATELETS: 215 x1000/ÂµL (ref 150–420)
BKR WAM RDW-CV: 14.8 % (ref 11.0–15.0)
BKR WAM RED BLOOD CELL COUNT.: 3.84 M/ÂµL — ABNORMAL LOW (ref 4.00–6.00)
BKR WAM WHITE BLOOD CELL COUNT: 8.4 x1000/ÂµL (ref 4.0–11.0)

## 2024-01-07 LAB — BASIC METABOLIC PANEL
BKR ANION GAP: 7 (ref 5–18)
BKR BLOOD UREA NITROGEN: 13 mg/dL (ref 8–25)
BKR BUN / CREAT RATIO: 14.1 (ref 8.0–25.0)
BKR CALCIUM: 9.4 mg/dL (ref 8.4–10.3)
BKR CHLORIDE: 113 mmol/L (ref 95–115)
BKR CO2: 19 mmol/L — ABNORMAL LOW (ref 21–32)
BKR CREATININE DELTA: 0.09
BKR CREATININE: 0.92 mg/dL (ref 0.50–1.30)
BKR EGFR, CREATININE (CKD-EPI 2021): >60 mL/min/{1.73_m2} (ref >=60)
BKR GLUCOSE: 85 mg/dL (ref 70–100)
BKR OSMOLALITY CALCULATION: 277 mosm/kg (ref 275–295)
BKR POTASSIUM: 3.7 mmol/L (ref 3.5–5.1)
BKR SODIUM: 139 mmol/L (ref 136–145)

## 2024-01-07 LAB — PARTIAL THROMBOPLASTIN TIME     (BH GH LMW Q YH): BKR PARTIAL THROMBOPLASTIN TIME: 21.7 s — ABNORMAL LOW (ref 23.0–31.0)

## 2024-01-07 LAB — TROPONIN T HIGH SENSITIVITY, 1 HOUR WITH REFLEX (BH GH LMW YH)
BKR TROPONIN T HS 1 HOUR DELTA FROM 0 HOUR: 1 ng/L
BKR TROPONIN T HS 1 HOUR: 11 ng/L

## 2024-01-07 LAB — URINE MICROSCOPIC     (BH GH LMW YH)
BKR RBC/HPF INSTRUMENT: 2 /HPF (ref 0–2)
BKR URINE SQUAMOUS EPITHELIAL CELLS, UA (NUMERIC): <1 /HPF (ref 0–5)
BKR WBC/HPF INSTRUMENT: 33 /HPF — ABNORMAL HIGH (ref 0–5)

## 2024-01-07 LAB — C-REACTIVE PROTEIN     (CRP): BKR C-REACTIVE PROTEIN: 2.6 mg/dL — ABNORMAL HIGH (ref 0.0–1.0)

## 2024-01-07 LAB — SARS-COV-2 (COVID-19)/INFLUENZA A+B/RSV BY RT-PCR (BH GH LMW YH)
BKR INFLUENZA A: NEGATIVE
BKR INFLUENZA B: NEGATIVE
BKR RESPIRATORY SYNCYTIAL VIRUS: NEGATIVE
BKR SARS-COV-2 RNA (COVID-19) (YH): NEGATIVE

## 2024-01-07 LAB — TROPONIN T HIGH SENSITIVITY, 0 HOUR BASELINE WITH REFLEX (BH GH LMW YH): BKR TROPONIN T HS 0 HOUR BASELINE: 10 ng/L

## 2024-01-07 LAB — D-DIMER, QUANTITATIVE: BKR D-DIMER: 2.62 mg{FEU}/L — ABNORMAL HIGH (ref <0.49)

## 2024-01-07 MED ORDER — ZZ IMS TEMPLATE
Freq: Two times a day (BID) | ORAL | Status: CP
Start: 2024-01-07 — End: ?
  Administered 2024-01-07 – 2024-01-15 (×11): 400 mg via ORAL

## 2024-01-07 MED ORDER — MELATONIN 3 MG TABLET
3 | Freq: Every evening | ORAL | Status: CP | PRN
Start: 2024-01-07 — End: ?
  Administered 2024-01-10: 02:00:00 3 mg via ORAL

## 2024-01-07 MED ORDER — CEFTRIAXONE IV PUSH 1000 MG VIAL & NS (ADULTS)
Freq: Once | INTRAVENOUS | Status: CP
Start: 2024-01-07 — End: ?
  Administered 2024-01-07: 10.000 mL via INTRAVENOUS

## 2024-01-07 MED ORDER — ONDANSETRON HCL (PF) 4 MG/2 ML INJECTION SOLUTION
4 | Freq: Four times a day (QID) | INTRAVENOUS | Status: AC | PRN
Start: 2024-01-07 — End: ?

## 2024-01-07 MED ORDER — MENTHOL 0.44 %-ZINC OXIDE 20.6 % TOPICAL OINTMENT
0.44-20.6 | Freq: Three times a day (TID) | TOPICAL | Status: SS
Start: 2024-01-07 — End: ?

## 2024-01-07 MED ORDER — ENOXAPARIN 40 MG/0.4 ML SUBCUTANEOUS SYRINGE
40 | SUBCUTANEOUS | Status: DC
Start: 2024-01-07 — End: 2024-01-08

## 2024-01-07 MED ORDER — SODIUM CHLORIDE 0.9 % (FLUSH) INJECTION SYRINGE
0.9 | INTRAVENOUS | Status: AC | PRN
Start: 2024-01-07 — End: ?

## 2024-01-07 MED ORDER — AZITHROMYCIN 250 MG TABLET
250 | ORAL | Status: DC
Start: 2024-01-07 — End: 2024-01-08
  Administered 2024-01-07: 250 mg via ORAL

## 2024-01-07 MED ORDER — SODIUM CHLORIDE 0.9 % (FLUSH) INJECTION SYRINGE
0.9 | Freq: Three times a day (TID) | INTRAVENOUS | Status: AC
Start: 2024-01-07 — End: ?
  Administered 2024-01-08 – 2024-01-12 (×9): 0.9 mL via INTRAVENOUS

## 2024-01-07 MED ORDER — ONDANSETRON 4 MG DISINTEGRATING TABLET
4 | Freq: Four times a day (QID) | ORAL | Status: AC | PRN
Start: 2024-01-07 — End: ?

## 2024-01-07 MED ORDER — SODIUM CHLORIDE 0.9 % INTRAVENOUS SOLUTION
INTRAVENOUS | Status: DC
Start: 2024-01-07 — End: 2024-01-08
  Administered 2024-01-08 (×2): via INTRAVENOUS

## 2024-01-07 MED ORDER — CEFTRIAXONE IV PUSH 1000 MG VIAL & NS (ADULTS)
INTRAVENOUS | Status: DC
Start: 2024-01-07 — End: 2024-01-08

## 2024-01-07 MED ORDER — ZINC OXIDE-COD LIVER OIL 40 % TOPICAL PASTE
40 | Freq: Three times a day (TID) | TOPICAL | Status: CP
Start: 2024-01-07 — End: ?
  Administered 2024-01-08 – 2024-01-15 (×23): 40 % via TOPICAL

## 2024-01-07 MED ORDER — POLYETHYLENE GLYCOL 3350 17 GRAM ORAL POWDER PACKET
17 | Freq: Every day | ORAL | Status: CP
Start: 2024-01-07 — End: ?
  Administered 2024-01-09 – 2024-01-12 (×2): 17 gram via ORAL

## 2024-01-07 MED ORDER — ROSUVASTATIN 10 MG TABLET
10 | Freq: Every evening | ORAL | Status: CP
Start: 2024-01-07 — End: ?
  Administered 2024-01-08 – 2024-01-14 (×8): 10 mg via ORAL

## 2024-01-07 MED ORDER — ACETAMINOPHEN 325 MG TABLET
325 | Freq: Once | ORAL | Status: CP
Start: 2024-01-07 — End: ?
  Administered 2024-01-07: 22:00:00 325 mg via ORAL

## 2024-01-07 MED ORDER — ENOXAPARIN 30 MG/0.3 ML SUBCUTANEOUS SYRINGE
300.3 mg/0.3 mL | Freq: Two times a day (BID) | SUBCUTANEOUS | Status: DC
Start: 2024-01-07 — End: 2024-01-09
  Administered 2024-01-08 – 2024-01-09 (×3): 30 mg/0.3 mL via SUBCUTANEOUS

## 2024-01-07 NOTE — ED Notes
 5:25 PM SBAR HandoffMalone, Danielle A MaloneSituation:	Admitting Diagnosis: The primary encounter diagnosis was Fever, unspecified fever cause. A diagnosis of Altered mental status, unspecified altered mental status type was also pertinent to this visit.Background:  Chief Complaint Patient presents with  Altered Mental Status   Pt sent in by [name] for AMS. LKW around noon per EMS. BG 109. She is alert and confused at baseline. Typically she would be able to answer direct questions (date, name, nurse name, etc) and now she can't. She is not on thinners. Has hx of DVT. No longer on eliquis.  Current Code Status   Prior  Assessment:Vitals:  01/07/24 1447 01/07/24 1603 BP: 124/74 (P) 122/68 Pulse: 88 88 Resp: 18 20 Temp: 99.7 ?F (37.6 ?C) (!) (P) 101.1 ?F (38.4 ?C) TempSrc: Temporal (P) Temporal SpO2: 96% 97% Weight: 65.6 kg  Access Lines   Active PICC Line / PIV Line / Intraosseous Line / ART Line / Line / CVC Line / Implanted Port   Name Placement date Placement time Site Days  Midline Catheter 09/21/23 1340 Double Lumen Right other (see comments) Open-ended catheter;Pressure injectable catheter 4 Fr 09/21/23  1340  other (see comments)  108  Periph IV 01/07/24 1500 basilic (5th finger side), right over-the-needle catheter system 22 gauge 1 in length IV Team 01/07/24  1500  basilic (5th finger side), right  less than 1    Trauma:  [x]  N/A      []  Modified     []  Full    []  Urine Toxicology obtained   ( note: utox must be obtained within 24hours of admission) Mental status:  []  A/O x4  [x]  Confused  []  Uncooperative/Agitated  []  Lethargic 		[]  Special Needs _____Sitter ordered/needed? [] Yes    [x]  No Suicide Precautions?  [] Yes    [x]  No        Functional Status: [] Independent   [x]  Needs assistanceDysphagia screen : [x]  Not Performed         Is patient on oxygen: [x] No    []  Yes RTM : [x] Yes    []  No	 [x]  With POx   []  Tele confirmedBowel/Bladder: [] Continent  []  Incontinent  []  Catheter [x]  External Device  Skin Alteration: []  Pressure Injury []  Wound []  None [x]  Skin Not Assessed   Valuables Noted:  []  Dentures   []  Glasses  []  Hearing Aids []  None  [x]  Not Reviewed  Recommendations:		Pending labs/meds/blood products: [x] No    []  Yes ___	Pending imaging/procedures : [x] No    []  Yes___	Brief plan/summary: AMS, fever, due to UTI. Started on Rocephin. Pt on RA, calm, cooperative.Medications ordered and administered in the Emergency Department:	Medications cefTRIAXone (ROCEPHIN) 1 g in sodium chloride 0.9% PF 10 mL (100 mg/mL) (has no administration in time range) azithromycin (ZITHROMAX) tablet 500 mg (has no administration in time range) acetaminophen (TYLENOL) tablet 650 mg (650 mg Oral Given 01/07/24 1648) Labs ordered and resulted in the Emergency Department. 	Abnormal Labs Reviewed PARTIAL THROMBOPLASTIN TIME     ([facility]) - Abnormal; Notable for the following components:     Result Value  PTT 21.7 (*)   All other components within normal limits D-DIMER, QUANTITATIVE - Abnormal; Notable for the following components:  D-Dimer 2.62 (*)   All other components within normal limits HEPATIC FUNCTION PANEL - Abnormal; Notable for the following components:  Alanine Aminotransferase (ALT) 10 (*)   Albumin 3.0 (*)   All other components within normal limits CBC WITH AUTO DIFFERENTIAL - Abnormal; Notable for the following components:  RBC 3.84 (*)   Hemoglobin 10.7 (*)   Hematocrit 34.30 (*)   Neutrophils 82.5 (*)   Lymphocytes 8.2 (*)   All other components within normal limits BASIC METABOLIC PANEL - Abnormal; Notable for the following components:  CO2 19 (*)   All other components within normal limits Radiology studies done in ER: XR Chest PA or AP-portable Final Result  [place] Stroke Code WO IV Contrast Final Result No acute intracranial abnormality.  Chronic and incidental findings as described above.  If there is clinical concern for acute ischemic stroke, consider further assessment with MRI.  Reported and signed by:  Bonne Dolores, MD   Cloyde Reams, RN

## 2024-01-08 ENCOUNTER — Inpatient Hospital Stay: Admit: 2024-01-08 | Payer: BLUE CROSS/BLUE SHIELD

## 2024-01-08 LAB — C-REACTIVE PROTEIN     (CRP): BKR C-REACTIVE PROTEIN: 6.4 mg/dL — ABNORMAL HIGH (ref 0.0–1.0)

## 2024-01-08 LAB — CBC WITH AUTO DIFFERENTIAL
BKR WAM ABSOLUTE IMMATURE GRANULOCYTES.: 0.02 x 1000/ÂµL (ref 0.00–0.30)
BKR WAM ABSOLUTE LYMPHOCYTE COUNT.: 1.21 x 1000/ÂµL (ref 0.60–3.70)
BKR WAM ABSOLUTE NRBC (2 DEC): 0 x 1000/ÂµL (ref 0.00–1.00)
BKR WAM ANC (ABSOLUTE NEUTROPHIL COUNT): 4.66 x 1000/ÂµL (ref 2.00–7.60)
BKR WAM BASOPHIL ABSOLUTE COUNT.: 0.04 x 1000/ÂµL (ref 0.00–1.00)
BKR WAM BASOPHILS: 0.6 % (ref 0.0–1.4)
BKR WAM EOSINOPHIL ABSOLUTE COUNT.: 0.13 x 1000/ÂµL (ref 0.00–1.00)
BKR WAM EOSINOPHILS: 1.9 % (ref 0.0–5.0)
BKR WAM HEMATOCRIT (2 DEC): 32.5 % — ABNORMAL LOW (ref 35.00–45.00)
BKR WAM HEMOGLOBIN: 10.2 g/dL — ABNORMAL LOW (ref 11.7–15.5)
BKR WAM IMMATURE GRANULOCYTES: 0.3 % (ref 0.0–1.0)
BKR WAM LYMPHOCYTES: 17.3 % (ref 17.0–50.0)
BKR WAM MCH (PG): 27.8 pg (ref 27.0–33.0)
BKR WAM MCHC: 31.4 g/dL (ref 31.0–36.0)
BKR WAM MCV: 88.6 fL (ref 80.0–100.0)
BKR WAM MONOCYTE ABSOLUTE COUNT.: 0.95 x 1000/ÂµL (ref 0.00–1.00)
BKR WAM MONOCYTES: 13.6 % — ABNORMAL HIGH (ref 4.0–12.0)
BKR WAM MPV: 10.1 fL (ref 8.0–12.0)
BKR WAM NEUTROPHILS: 66.3 % (ref 39.0–72.0)
BKR WAM NUCLEATED RED BLOOD CELLS: 0 % (ref 0.0–1.0)
BKR WAM PLATELETS: 221 x1000/ÂµL (ref 150–420)
BKR WAM RDW-CV: 14.8 % (ref 11.0–15.0)
BKR WAM RED BLOOD CELL COUNT.: 3.67 M/ÂµL — ABNORMAL LOW (ref 4.00–6.00)
BKR WAM WHITE BLOOD CELL COUNT: 7 x1000/ÂµL (ref 4.0–11.0)

## 2024-01-08 LAB — BASIC METABOLIC PANEL
BKR ANION GAP: 7 (ref 5–18)
BKR BLOOD UREA NITROGEN: 10 mg/dL (ref 8–25)
BKR BUN / CREAT RATIO: 12.5 (ref 8.0–25.0)
BKR CALCIUM: 9.5 mg/dL (ref 8.4–10.3)
BKR CHLORIDE: 113 mmol/L (ref 95–115)
BKR CO2: 19 mmol/L — ABNORMAL LOW (ref 21–32)
BKR CREATININE DELTA: -0.12
BKR CREATININE: 0.8 mg/dL (ref 0.50–1.30)
BKR EGFR, CREATININE (CKD-EPI 2021): >60 mL/min/{1.73_m2} (ref >=60)
BKR GLUCOSE: 95 mg/dL (ref 70–100)
BKR OSMOLALITY CALCULATION: 276 mosm/kg (ref 275–295)
BKR POTASSIUM: 3.4 mmol/L — ABNORMAL LOW (ref 3.5–5.1)
BKR SODIUM: 139 mmol/L (ref 136–145)

## 2024-01-08 LAB — PROCALCITONIN     (BH GH LMW Q YH)
BKR PROCALCITONIN: 0.22 ng/mL
BKR PROCALCITONIN: 0.47 ng/mL — ABNORMAL HIGH

## 2024-01-08 LAB — UA REFLEX CULTURE

## 2024-01-08 LAB — MRSA SCREEN BY PCR: BKR MRSA COLONIZATION STATUS PCR: NOT DETECTED

## 2024-01-08 MED ORDER — DEXTROSE 15 GRAM/60 ML ORAL LIQUID
1560 gram/60 mL | ORAL | Status: AC | PRN
Start: 2024-01-08 — End: 2024-01-15

## 2024-01-08 MED ORDER — SODIUM CHLORIDE 0.9 % INTRAVENOUS SOLUTION
INTRAVENOUS | Status: DC
Start: 2024-01-08 — End: 2024-01-09
  Administered 2024-01-08 – 2024-01-09 (×2): via INTRAVENOUS

## 2024-01-08 MED ORDER — APIXABAN 5 MG TABLET
5 mg | Freq: Two times a day (BID) | ORAL | Status: CP
Start: 2024-01-08 — End: 2024-01-15
  Administered 2024-01-09 – 2024-01-15 (×13): 5 mg via ORAL

## 2024-01-08 MED ORDER — PIPERACILLIN-TAZOBACTAM (ZOSYN) 4.5GM MBP
Freq: Three times a day (TID) | INTRAVENOUS | Status: DC
Start: 2024-01-08 — End: 2024-01-09
  Administered 2024-01-08 – 2024-01-09 (×2): 100.000 mL/h via INTRAVENOUS

## 2024-01-08 MED ORDER — POTASSIUM CITRATE ER 15 MEQ (1,620 MG) TABLET,EXTENDED RELEASE
15 mEq | Freq: Every day | ORAL | Status: SS
Start: 2024-01-08 — End: ?

## 2024-01-08 MED ORDER — FERROUS GLUCONATE 324 MG (38 MG IRON) TABLET
32438 mg (38 mg iron) | Freq: Every day | ORAL | Status: SS
Start: 2024-01-08 — End: ?

## 2024-01-08 MED ORDER — ZINC OXIDE TOP
Freq: Three times a day (TID) | TOPICAL | Status: SS
Start: 2024-01-08 — End: ?

## 2024-01-08 MED ORDER — FRUIT JUICE
ORAL | Status: AC | PRN
Start: 2024-01-08 — End: 2024-01-15

## 2024-01-08 MED ORDER — ACETAMINOPHEN 1,000 MG/100 ML (10 MG/ML) INTRAVENOUS SOLUTION
10 | Freq: Four times a day (QID) | INTRAVENOUS | Status: CP | PRN
Start: 2024-01-08 — End: ?

## 2024-01-08 MED ORDER — SKIM MILK
ORAL | Status: AC | PRN
Start: 2024-01-08 — End: 2024-01-15

## 2024-01-08 MED ORDER — DEXTROSE 10 % IV BOLUS FOR ORDERABLE
INTRAVENOUS | Status: AC | PRN
Start: 2024-01-08 — End: 2024-01-15

## 2024-01-08 MED ORDER — IOHEXOL 350 MG IODINE/ML INTRAVENOUS SOLUTION
350 | Freq: Once | INTRAVENOUS | Status: CP | PRN
Start: 2024-01-08 — End: ?
  Administered 2024-01-08: 22:00:00 350 mL via INTRAVENOUS

## 2024-01-08 MED ORDER — ERTAPENEM 1000 MG MBP
INTRAVENOUS | Status: DC
Start: 2024-01-08 — End: 2024-01-10
  Administered 2024-01-09 – 2024-01-10 (×2): 50.000 mL/h via INTRAVENOUS

## 2024-01-08 MED ORDER — VANCOMYCIN 750 MG IN 250 ML IVPB (VIALMATE)
Freq: Two times a day (BID) | INTRAVENOUS | Status: DC
Start: 2024-01-08 — End: 2024-01-09
  Administered 2024-01-08: 16:00:00 250.000 mL/h via INTRAVENOUS

## 2024-01-08 MED ORDER — TRAMADOL (ULTRAM) 25 MG HALFTAB
25 | Freq: Once | ORAL | Status: CP
Start: 2024-01-08 — End: ?
  Administered 2024-01-08: 10:00:00 25 mg via ORAL

## 2024-01-08 MED ORDER — INSULIN U-100 REGULAR HUMAN 100 UNIT/ML (CORRECTION SCALE)
100 | Freq: Four times a day (QID) | SUBCUTANEOUS | Status: DC
Start: 2024-01-08 — End: 2024-01-09

## 2024-01-08 MED ORDER — ACETAMINOPHEN 325 MG TABLET
325 mg | Freq: Four times a day (QID) | ORAL | Status: CP | PRN
Start: 2024-01-08 — End: 2024-01-16
  Administered 2024-01-08 – 2024-01-10 (×3): 325 mg via ORAL

## 2024-01-08 MED ORDER — GLUCAGON 1 MG/ML IN STERILE WATER
Freq: Once | INTRAMUSCULAR | Status: AC | PRN
Start: 2024-01-08 — End: 2024-01-15

## 2024-01-08 NOTE — Other
 [facility] Initial Consult NoteCC: AMSPresentation HPI: [id number] w/ conservatorship, bedbound and lives in NH presents after discovery at around noon of worsening mentation and right sided weakness. Baseline functional status is unclear. On arrival to [facility], stroke code activated however cancelled as patient refused Morning Glory scan and examination, reported to be feeling at baseline. On exam, patient did not know why she was in the hospital, reported that she was at neurological baseline. Per chart patient on anticoagulation at home (eliquis 5mg  BID) and thus not a candidate for TNK. Given poor functional baseline, not a candidate for EVT and thus CTA was deferred. 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 CODE) (HC Code)   Incontinent of urine   Paraplegia (HC Code)   Rheumatoid arthritis (HC Code)   Patient Active Problem List  Diagnosis Date Noted  UTI due to extended-spectrum beta lactamase (ESBL) producing Escherichia coli 09/25/2023  S/P cystoscopy with ureteral stent placement 09/24/2023  UTI (urinary tract infection) 09/17/2023  Sepsis, due to unspecified organism, unspecified whether acute organ dysfunction present (HC Code) (HC CODE) (HC Code) 09/15/2023  Renal calculus 07/16/2023  Sepsis (HC Code) 06/27/2023  Kidney stone 06/26/2023  Septic shock (HC Code) 06/26/2023  Sepsis due to urinary tract infection (HC Code) (HC CODE) (HC Code) 06/26/2023  Rheumatoid arthritis involving multiple sites with positive rheumatoid factor (HC Code) (HC CODE) (HC Code) 05/26/2023  Kidney stone on left side 05/26/2023  Gastrointestinal hemorrhage, unspecified gastrointestinal hemorrhage type 05/24/2023  Adjustment disorder, unspecified 04/10/2023  Anemia 04/09/2023  GERD (gastroesophageal reflux disease) 04/09/2023  Iron deficiency anemia 04/07/2023  Ileostomy in place [facility] Code) (HC CODE) (HC Code) 12/19/2021 Past Surgical History Past Surgical History: Procedure Laterality Date  BAND HEMORRHOIDECTOMY    CYSTECTOMY  1972  ILEOSTOMY    LAPAROSCOPIC SALPINGOOPHERECTOMY  1986  LAPAROTOMY  2022  PARTIAL COLECTOMY  2022  PEG TUBE PLACEMENT  01/2022  UPPER GASTROINTESTINAL ENDOSCOPY    Outpatient MedicationsNo current facility-administered medications on file prior to encounter. Current Outpatient Medications on File Prior to Encounter Medication Sig Dispense Refill  acetaminophen (TYLENOL) 325 mg tablet Take 3 tablets (975 mg total) by mouth every 8 (eight) hours as needed for pain.    apixaban (ELIQUIS) 5 mg tablet 1 tablet (5 mg total) by Per G Tube route every 12 (twelve) hours.    ferrous gluconate (FERGON) 324 mg (38 mg iron) tablet Take 1 tablet (324 mg total) by mouth every other day.    fluconazole (DIFLUCAN) 150 mg tablet     folic acid (FOLVITE) 1 mg tablet Take 1 tablet (1 mg total) by mouth daily. 30 tablet 11  gabapentin (NEURONTIN) 400 mg capsule Take 1 capsule (400 mg total) by mouth 2 (two) times daily with breakfast and dinner.    magnesium hydroxide (MILK OF MAGNESIA) 400 mg/5 mL suspension Take 30 mLs by mouth daily as needed for constipation.    melatonin 3 mg tablet Take 1 tablet (3 mg total) by mouth nightly.  0  nitrofurantoin (MACRODANTIN)  50 mg capsule Take 1 capsule (50 mg total) by mouth daily. 90 capsule 0  pantoprazole (PROTONIX) 40 mg tablet Take 1 tablet (40 mg total) by mouth daily. 30 tablet 11  polyethylene glycol (MIRALAX) 17 gram packet Take 1 packet (17 g total) by mouth daily. Mix in 8 ounces of water, juice, soda, coffee or tea prior to taking. 14 each 2  potassium citrate (UROCIT-K 15) 15 mEq extended release tablet Take 1 tablet (15 mEq total) by mouth 2 (two) times daily. 180 tablet 3  rosuvastatin (CRESTOR) 10 mg tablet 1 tablet (10 mg total) by Per G Tube route nightly.    sodium chloride 0.45 % solution     sodium chloride 0.9% solution     traMADoL (ULTRAM) 50 mg tablet Take 1 tablet (50 mg total) by mouth every 8 (eight) hours as needed for pain.    Current Presentation  Inpatient MedicationsScheduled Meds:No current facility-administered medications for this encounter. Continuous Infusions:PRN Meds:Vitals BP 124/74  - Pulse 88  - Temp 99.7 ?F (37.6 ?C) (Temporal)  - Resp 18  - Wt 65.6 kg  - SpO2 96%  - BMI 24.07 kg/m? Neurologic exam:Plesantly demented Lying in bed Not in distresssAwake, Alert Following simple commnadsFluent, able to [facility] I am not wearing a hat Moving BUE antigrabity however R side with drift and would hit the bed proximally, able to main LUE antigraivty Unable to move BLE to nox stim, however able to sense it Review of Labs/Diagnostics: Lab Review:Recent Labs   01/16/251529 WBC 8.4 HGB 10.7* HCT 34.30* MCV 89.3 PLT 215 Recent Labs   01/16/251445 GLU 90 No results for input(s): PTT, INR in the last 72 hours.Invalid input(s): PTNo results for input(s): ALT, AST, BILITOT, BILIDIR, ALKPHOS, AMYLASE, LIPASE, ALBUMIN in the last 72 hours.Recent Labs Lab 01/16/251445 GLU 90 Diagnostic Review:I independently and personally reviewed the following: NCHCT - no acute hypodensity or bleed Assessment and Plan  Imp Unclear etiology for presentation however at current time low suspicion for ischemic stroke. No contraindications from continuing home anticoagulation. Would obtain collateral baseline status and obtain an infectious panel. If patient is significantly different from baseline and negative infectious workup, an MRI maybe indicated to look for intracranial changes. For now continue with current management #Rec-Continue home [facility] -Infectious workup -Monitor clinically -Collateral Thank you for allowing me to participate in the care of this patient. Please contact me via epic direct message and I will promptly return your call during normal business hours. Odessa Fleming MD Neurology Attending

## 2024-01-08 NOTE — Plan of Care
 Plan of Care Overview/ Patient StatusPatient admitted with right lower lobe pneumonia.Patient is LTC at [facility].She is bed bound and requires assistance with ADLS.Patient will return to [facility] on DC.Case Management Screening and Evaluation  Flowsheet Row Most Recent Value Case Management Screening: Chart review completed. If YES to any question below then proceed to CM Eval/Plan  Is there a change in their cognitive function Yes Do you anticipate that the pt will have any discharge needs requiring CM intervention? Yes Has there been an unscheduled readmission within the last 30 days and/or four (4) encounters (encounters include: ED, OBS, Inpatient) within the last six (6) months? No Were there services prior to admission ( Examples: Acute [facility],  Assisted Living, HD, Homecare, Extended Care Facility, Methadone, SNF, Outpatient Infusion Center) Yes Negative/Positive Screen Positive Screening: Complete CM Evaluation and Plan Case manager will take lead to arrange post-acute care services and continue to work with the care team as the patient progresses towards discharge Yes Case Manager Attestation  I have reviewed the medical record and completed the above screen. CM staff will follow patient's progress and discuss the plan of care with the Treatment Team. Yes Case Management Evaluation and Plan  Arrived from prior to admission SNF (receiving long term care) Admitted from: [facility] Lives with Facility Resident Services Prior to Admission None Patient Requires Care Coordination Intervention Due To Discharge planning needs/concerns Documented Insurance Accurate Yes Any financial concerns related to anticipated discharge needs No Patient's home address verified Yes Patient's PCP of record verified Yes Last Date Seen by PCP 0-3 months Patient has new PCP (specify name). PLEASE UPDATE DEMOGRAPHICS Manson Passey, MD Source of Clinical History  Patient's clinical history has been reviewed and source of Information is: Patient, Conservator Case Manager Attestation  I have reviewed the medical record and completed the above evaluation with the following recommendations. Yes Discharge Planning Coordination Recommendations  Discharge Planning Coordination Recommendations Skilled Nursing Facility for long term care Case Manager reviewed plan of care/ continuum of care need's with  Patient  Shuronda Santino Nilsa Nutting RN, BSN [facility] Nurse Case Manager

## 2024-01-08 NOTE — Progress Notes
 Neurology Attending Progress Note Seen yesterday during stroke code. Started on abx. Seen this morning, patient back at neurological baseline: Alert, orientedFully fluentKnew she was in CTMoving BUE antigravity. Discuss the events yesterday, to which the patient reports she believes it due to an infection. Reports she is no longer on anticoagulation (it was discontinued) however she is unsure why it was stopped. She decline an MRI as she is claustrophobic. Imp Suspect toxic/metabolic encephalopathy which is now improved. Patient is back at neurological baseline. MRI likely to be low yield given lower suspicion for stroke and patient has claustrophobia issuesNo further workup requried from stroke team perspective. Please reach out as Westley Chandler MD

## 2024-01-08 NOTE — H&P
 [facility] Hospitalist Attending - History & PhysicalHistory provided by: EMR review and outside medical record(s) reviewHistory limited by: the condition of the patientDate of Admission: 01/16/25This is the primary Admitting H&P for the patient for this hospital admission.Consults called: neuro- dr. Ramiro Harvest: CHIEF COMPLAINT -    CONFUSIONHPI  -  Danielle [name],68 y.o.,female, presents brought in from NH for confusion.She was reportedly normal at noon but later did not recognize her aid and could not provide her date of birth. She typically has a good understanding of the date and time, suggesting a significant change in her mental status. Bayan denies any pain or weakness. She was uncooperative with some parts of the neurological assessment, making it difficult to fully evaluate her condition. She has a history of polyneuropathy, which may be relevant to her current presentation. She also has a Acupuncturist, suggesting some degree of cognitive or functional impairment. She has a wound in an unspecified location, but denies any associated pain. Danielle [name]'s mobility status is unclear, as she states she does not really walk. She also has a history of a ryostomy. The trajectory of her current symptoms and the effects of any prior treatments are not discussed in the transcript.PAST MEDICAL HISTORY -  Rheumatoid arthritisHistory of DVT and was on anticoagulation- NOT ON AC AS PER TODAY'S NH MED SHEETHistory of perforated diverticulitisPeripheral neuropathyCognitive impairment/dementiaWheelchair-bound statusObstructive uropathy due to renal stonesUreteral strictureGERDLeft-sided pyelonephritisParaplegia-bed-bound status- wheelchair usagePAST SURGICAL HISTORY  -  Ureteral stent placementHemicolectomy with ileostomy 2023PEG tube insertion and subsequent removalHemorrhoidectomyLab salpingo-oophorectomyEGD and colonoscopyCystectomySOCIAL HISTORY  -  Never smoker, no alcohol, no drugs Currently resides in a nursing home Wheelchair-bound status.  Nursing home uses Hoyer lift to lift her from the bed to the wheelchairFAMILY HISTORY  - Unelicitable due to patient's mental statusALLERGIES  -  Allergies as of 01/07/2024  (No Known Allergies) HOME MEDICATION LIST  -  Prior to Admission Medications   Medication Sig Last Dose Dispense Doc. Provider  ferrous gluconate (FERGON) 324 mg (38 mg iron) tablet Take 1 tablet (324 mg total) by mouth every other day. 01/07/2024 -- Danielle So, FNP  folic acid (FOLVITE) 1 mg tablet Take 1 tablet (1 mg total) by mouth daily. 01/07/2024 30 tablet Rinaldo Ratel, MD  gabapentin (NEURONTIN) 400 mg capsule Take 1 capsule (400 mg total) by mouth 2 (two) times daily with breakfast and dinner. 01/07/2024 -- Provider, Historical  menthol-zinc oxide 0.2-20 % Pste Apply 1 Application topically every 8 (eight) hours. 01/07/2024 -- Provider, Historical  nitrofurantoin (MACRODANTIN) 50 mg capsule Take 1 capsule (50 mg total) by mouth daily. 01/07/2024 90 capsule Danielle Morn, MD  potassium citrate (UROCIT-K 15) 15 mEq extended release tablet Take 1 tablet (15 mEq total) by mouth 2 (two) times daily. 01/07/2024 180 tablet Danielle Morn, MD  rosuvastatin (CRESTOR) 10 mg tablet Take 1 tablet (10 mg total) by mouth nightly. 01/06/2024 -- Provider, Historical  acetaminophen (TYLENOL) 325 mg tablet Take 3 tablets (975 mg total) by mouth every 8 (eight) hours as needed for pain. Unknown -- Provider, Historical  magnesium hydroxide (MILK OF MAGNESIA) 400 mg/5 mL suspension Take 30 mLs by mouth daily as needed for constipation. Unknown -- Provider, Historical  polyethylene glycol (MIRALAX) 17 gram packet Take 1 packet (17 g total) by mouth daily. Mix in 8 ounces of water, juice, soda, coffee or tea prior to taking. Unknown 14 each Ferrel Logan, PA  traMADoL (ULTRAM) 50 mg tablet Take 1 tablet (50 mg total) by mouth every  8 (eight) hours as needed for pain. Unknown -- Provider, Historical  apixaban (ELIQUIS) 5 mg tablet 1 tablet (5 mg total) by Per G Tube route every 12 (twelve) hours. -- -- Provider, Historical  fluconazole (DIFLUCAN) 150 mg tablet  -- -- Provider, Historical  melatonin 3 mg tablet Take 1 tablet (3 mg total) by mouth nightly. -- -- Tennis Ship, MD  pantoprazole (PROTONIX) 40 mg tablet Take 1 tablet (40 mg total) by mouth daily. -- 30 tablet Rinaldo Ratel, MD  sodium chloride 0.45 % solution  -- -- Provider, Historical  sodium chloride 0.9% solution  -- -- Provider, Historical  ED COURSE -  admittedHome medication list obtained from:  Obtained from nursing home papers and verified and confirmed. Patient's medications were reviewed and also verified with patient/relative at the bedside. All confirmed medications were reconciled and list updated. Review of Systems Review of Systems Constitutional:  Positive for fatigue. HENT:  Negative for congestion.  Respiratory:  Negative for shortness of breath.  Cardiovascular:  Negative for chest pain. Gastrointestinal:  Negative for abdominal pain. Genitourinary:  Negative for dysuria. Musculoskeletal:  Negative for joint swelling. Neurological:  Negative for syncope. Psychiatric/Behavioral:  Positive for confusion and dysphoric mood.  All other systems reviewed and are negative.10 point system review done. All positive and negative pertinent features were reviewed with the patient. Positive features as mentioned above in the list as well as in the History of presenting Illness and negative otherwise.Objective Physical Exam Vitals: BP (P) 122/68  - Pulse 88  - Temp (!) (P) 101.1 ?F (38.4 ?C) (Temporal)  - Resp 20  - Wt 65.6 kg  - SpO2 97%  - BMI 24.07 kg/m? Physical ExamVitals reviewed. Constitutional:     Appearance: She is ill-appearing. HENT:    Mouth/Throat:    Mouth: Mucous membranes are dry. Eyes:    General: No scleral icterus.   Extraocular Movements: Extraocular movements intact.    Pupils: Pupils are equal, round, and reactive to light. Cardiovascular:    Rate and Rhythm: Normal rate and regular rhythm.    Pulses: Normal pulses. Pulmonary:    Breath sounds: Rhonchi and rales (righ posterior zones) present. Abdominal:    General: Bowel sounds are normal. There is no distension.    Tenderness: There is no abdominal tenderness. Musculoskeletal:    Right lower leg: Edema present.    Left lower leg: Edema present. Neurological:    Mental Status: She is alert.    Comments: OBEYS COMMANDSCOMPREHENSION S FAIRLIFTED BOTH ARMS ABOVE SHOULDERSUNABLE TO MOVE LEGS- SAYS SHE CANNOT MOVE(WHEELCHAIR BOUND STATUS AS PER NH PAPERS) Psychiatric:    Comments: CONFUSEDDOES NOT KNOW WHAT IS HAPPENING TO HER Pertinent Labs/Diagnostics Labs: Recent Results (from the past 48 hours) POC Glucose (Fingerstick)  Collection Time: 01/07/24  2:45 PM Result Value Ref Range  Glucose, Meter 90 70 - 100 mg/dL Protime and INR  Collection Time: 01/07/24  3:29 PM Result Value Ref Range  Prothrombin Time 11.1 9.4 - 12.0 seconds  INR 1.08 0.91 - 1.18 Partial thromboplastin time  Collection Time: 01/07/24  3:29 PM Result Value Ref Range  PTT 21.7 (L) 23.0 - 31.0 seconds D-dimer, quantitative  Collection Time: 01/07/24  3:29 PM Result Value Ref Range  D-Dimer 2.62 (H) <0.49 mg/L FEU Troponin T High Sensitivity, Emergency; 0 hour baseline AND 1 hour with reflex (3 hour)  Collection Time: 01/07/24  3:29 PM Result Value Ref Range  High Sensitivity Troponin T 10 See Comment ng/L Hepatic function panel  Collection Time: 01/07/24  3:29 PM Result Value Ref Range  Total Bilirubin 0.1 0.0 - 1.0 mg/dL  Bilirubin, Direct <4.0 0.0 - 0.3 mg/dL  Bilirubin, Indirect >9.8 mg/dL  Alkaline Phosphatase 84 20 - 120 U/L  Alanine Aminotransferase (ALT) 10 (L) 12 - 78 U/L  Aspartate Aminotransferase (AST) 16 5 - 37 U/L  AST/ALT Ratio 1.6 Reference Range Not Established  Total Protein 7.6 6.4 - 8.2 g/dL  Albumin 3.0 (L) 3.4 - 5.0 g/dL  Globulin 4.6 g/dL  A/G Ratio 0.7  Type and screen  Collection Time: 01/07/24  3:29 PM Result Value Ref Range  ABO Grouping B   Rh Type POS   Antibody Screen NEG   Specimen Expiration Date And Time 01/10/2024 23:59  CBC auto differential  Collection Time: 01/07/24  3:29 PM Result Value Ref Range  WBC 8.4 4.0 - 11.0 x1000/?L  RBC 3.84 (L) 4.00 - 6.00 M/?L  Hemoglobin 10.7 (L) 11.7 - 15.5 g/dL  Hematocrit 11.91 (L) 47.82 - 45.00 %  MCV 89.3 80.0 - 100.0 fL  MCH 27.9 27.0 - 33.0 pg  MCHC 31.2 31.0 - 36.0 g/dL  RDW-CV 95.6 21.3 - 08.6 %  Platelets 215 150 - 420 x1000/?L  MPV 10.1 8.0 - 12.0 fL  Neutrophils 82.5 (H) 39.0 - 72.0 %  Lymphocytes 8.2 (L) 17.0 - 50.0 %  Monocytes 8.0 4.0 - 12.0 %  Eosinophils 0.5 0.0 - 5.0 %  Basophil 0.4 0.0 - 1.4 %  Immature Granulocytes 0.4 0.0 - 1.0 %  nRBC 0.0 0.0 - 1.0 %  Absolute Lymphocyte Count 0.69 0.60 - 3.70 x 1000/?L  Monocyte Absolute Count 0.67 0.00 - 1.00 x 1000/?L  Eosinophil Absolute Count 0.04 0.00 - 1.00 x 1000/?L  Basophil Absolute Count 0.03 0.00 - 1.00 x 1000/?L  Absolute Immature Granulocyte Count 0.03 0.00 - 0.30 x 1000/?L  Absolute nRBC 0.00 0.00 - 1.00 x 1000/?L  ANC (Abs Neutrophil Count) 6.96 2.00 - 7.60 x 1000/?L Basic metabolic panel  Collection Time: 01/07/24  3:29 PM Result Value Ref Range  Sodium 139 136 - 145 mmol/L  Potassium 3.7 3.5 - 5.1 mmol/L  Chloride 113 95 - 115 mmol/L  CO2 19 (L) 21 - 32 mmol/L  Anion Gap 7 5 - 18  Glucose 85 70 - 100 mg/dL  BUN 13 8 - 25 mg/dL  Creatinine 5.78 4.69 - 1.30 mg/dL  Calcium 9.4 8.4 - 62.9 mg/dL BUN/Creatinine Ratio 52.8 8.0 - 25.0  Osmolality Calculation 277 275 - 295 mOsm/kg  eGFR (Creatinine) >60 >=60 mL/min/1.42m2  Creatinine Delta 0.09 See Comment EKG  Collection Time: 01/07/24  3:32 PM Result Value Ref Range  Heart Rate 78 bpm  QRS Interval 86 ms  QT Interval 382 ms  QTC Interval 435 ms  P Axis 45 deg  QRS Axis -37 deg  T Wave Axis 49 deg  P-R Interval 162 msec  SEVERITY Abnormal ECG severity Troponin T High Sensitivity, 1 Hour With Reflex ([facility])  Collection Time: 01/07/24  4:50 PM Result Value Ref Range  High Sensitivity Troponin T 11 See Comment ng/L  1 hour Delta from 0 Hour, HS-Troponin T 1 ng/L Urinalysis with culture reflex     ([facility])  Collection Time: 01/07/24  5:04 PM  Specimen: Urine Result Value Ref Range  Clarity, UA Clear Clear  Color, UA Colorless Yellow, Colorless  Specific Gravity, UA 1.011 1.005 - 1.030  pH, UA 7.0 5.5 - 7.5  Protein, UA Negative Negative,  Trace  Glucose, UA Negative Negative  Ketones, UA Negative Negative  Blood, UA Negative Negative  Bilirubin, UA Negative Negative  Leukocytes, UA 4+ (A) Negative  Nitrite, UA Negative Negative  Urobilinogen, UA <2.0 <=2.0 mg/dL Urine microscopic     ([facility])  Collection Time: 01/07/24  5:04 PM Result Value Ref Range  RBC/HPF, UA 2 0 - 2 /HPF  WBC/HPF, UA 33 (H) 0 - 5 /HPF  Bacteria, UA Rare None-Rare /HPF  Urine Squamous Epithelial Cells, UA <1 0 - 5 /HPF DiagnosticsXR Chest PA or AP-portable Final Result  [place] Head Stroke Code WO IV Contrast Final Result No acute intracranial abnormality.  Chronic and incidental findings as described above.  If there is clinical concern for acute ischemic stroke, consider further assessment with MRI.  Reported and signed by:  Bonne Dolores, MD   EKG: I HAVE REVIEWED THE EKG.  Normal sinus rhythm at 80/minute, left axis deviation,No acute ST/T changes in concordant leads suggestive of cardiac ischemia seen. Assessment: Kaelea A Hiller,67 y.o.,female is admitted with right lower lobe pneumonia.Patient had previous dysphagia for which she had PEG tube insertion and subsequently PEG was removed as she has been eating well now.  Her diet is regular diet with thin liquids.ACTIVE ACUTE DIAGNOSES   Right lower lobe pneumoniaAcute metabolic encephalopathyCHRONIC ONGOING ISSUES    Dementia with cognitive impairment Rheumatoid arthritisBed-bound statusChronic anemiaPlan: PULMONARY  -       respiratory status is stable.  Nasal oxygen p.r.n.Swallow evaluation in a.m.ID -     started on ceftriaxone with a azithromycinNot much of evidence to support aspiration pneumonia at this time.  However patient does not have elevated procalcitonin which can point towards having an aspiration pneumoniaIf patient spikes fever tomorrow then can add anaerobic coverageCARDIAC -     blood pressure is stable.  Continue statinHEME/ONC -  anemia is chronicSHE IS NO LONGER ON ELIQUIS AS PER NH PAPERSMETABOLIC/RENAL/ENDOCRINE -    mild dehydration present.  However will not give any extra IV fluids at this timeCheck electrolytes in a.m.GI -    regular diet was prescribed at the nursing home.  However shall give dental soft diet now and call for swallow evaluation in a.m.Avoid PPIs and famotidineNEURO/PSYCH -    supportive care.  Continue gabapentinNeurology consult appreciatedNot much of evidence to suggest cerebral pathology other than dementia.  No evidence of CVA-new.Neuro consult appreciatedDVT PROPHYLAXIS -    LOVENOXOTHERS -    noneDIET -    dental softCOMMUNICATION WITH PATIENT/FAMILY:  Has a conservator 1 was unable to reach them.  Left voicemailCODE STATUS:  Full code as per nursing home papers Advance Care Planning Patient has a living will: yesPatient has healthcare representative/proxy: yesConservatorPlease enter code [id number] if patient answers Yes or code [id number] if patient answers No to any of the above questions and you are not completing a 30 minute or more Advanced Care Planning encounter Diet:  Dental softDVT PPX:     LovenoxEmergency Contact:  ConservatorDispo:  Will need 2 days hospital stayTELEMETRY:    Currently does not need Telemetry at admission. To be reviewed after 24 hours.       Time Spent for the admission process = 80 minutes. More than 50% of that time was spent in coordinating care including discussing plans for overall treatment and f/u with patient.Notifications: PCP: Manson Passey    Primary Care Provider was notified of this admission. NoFamily was notified of this admission. NoPlan discussed with patient and/or family. Yes====H&P  was routed to PCP in EPICCurrent Facility-Administered Medications Medication Dose Route Frequency Last Rate  azithromycin  500 mg Oral Q24H    cefTRIAXone  1 g Intravenous Once    [START ON 01/08/2024] cefTRIAXone  1 g Intravenous Q24H    [START ON 01/08/2024]  enoxaparin prophylaxis dosing  40 mg Subcutaneous Daily    gabapentin  400 mg Oral BID WC    melatonin  3 mg Oral Nightly PRN    ondansetron  4 mg Oral Q6H PRN    Or  ondansetron (ZOFRAN) IV Push  4 mg IV Push Q6H PRN    [START ON 01/08/2024] polyethylene glycol  17 g Oral Daily    rosuvastatin  10 mg Oral Nightly    sodium chloride  3 mL IV Push Q8H    sodium chloride  3 mL IV Push PRN for Line Care    zinc oxide-cod liver oil   Topical (Top) TID    PATIENT WAS ADMITTED ON: 01/07/24- Carmela Hurt, MD    Pager# [phone]         Hospitalist       Diplomate-[employer]1/16/20255:51 PM(Disclaimer: Portions of this document may have been transcribed using speech recognition software. Despite attempts at proofreading this document may contain transcription errors. Please do not hesitate to contact this writer to request clarification.)

## 2024-01-08 NOTE — Plan of Care
 Plan of Care Overview/ Patient StatusAdult Speech and Language PathologySwallow Evaluation1/17/2025Patient Name:  Danielle Peacher MaloneMR#:  [id number] Date of Birth:  1957-01-10Therapist:  [place] Hubert Raatz, MS, CCC-SLP SLP IP Adult Date of Visit/Evaluation - 01/08/24 0935    Date of Visit/Evaluation  Date of Visit / Treatment 01/08/24      SLP IP Adult General Information - 01/08/24 0935    General Information  Pertinent History of Current Problem 67 y.o.,female, presents brought in from NH for confusion. She was reportedly normal at noon but later did not recognize her aid and could not provide her date of birth. She typically has a good understanding of the date and time, suggesting a significant change in her mental status. Jezreel denies any pain or weakness. She was uncooperative with some parts of the neurological assessment, making it difficult to fully evaluate her condition. She has a history of polyneuropathy, which may be relevant to her current presentation. She also has a Acupuncturist, suggesting some degree of cognitive or functional impairment. She has a wound in an unspecified location, but denies any associated pain. An order for a swallow evaluation was placed to determine safest/least restrictive diet.   Prior Level of Functioning Pt was on a regular diet with thin liquids prior to admission.      01/08/24 0935 Clinical / Bedside Swallow Evaluation Type of Study Initial Reason for Study concern for aspiration risk Patients Current Diet Thin liquids;Soft diet Patient Position Bed;Upright Patient Respiratory Status during evaluation - O2 Delivery Room Air      Baseline Coughing Absent      Baseline Throat Clearing Absent      Baseline Breath/Vocal Quality Normal      Other Pt is missing lower incisors, all left lower molars and 1-2 right lower molars. She reports having a partial lower denture which was lost. Saliva Swallows WFL Thin Liquid yes      Delivery Cup;Self-Fed      Volume consecutive drinking      Oral Phase Adequate;Successful cup drinking      Pharyngeal Phase Adequate      Overt Signs / Symptoms of Aspiration / Difficulty None Soft Solid yes      Delivery Self-fed      Volume small bites      Oral Phase Adequate      Pharyngeal Phase Adequate      Overt Signs / Symptoms of Aspiration / Difficulty None Clinical Impression Suspected oral phase dysphagia Clinical Impression Based On: Decreased masticatory ability Predictors of Apsiration Pneumonia Hx of dysphagia or reflux;Decayed teeth Patient should be NPO No Diet Recommendations Dental soft;Thin liquids Medication Administration whole;with puree;with liquid Recommendations Eat slowly;Sit upright with eating and drinking;Small sips / bites;Alternate liquids and solids Patients feeding ability Patient needs assistance Aspiration Precautions yes      Precautions include: Head of bed 90 degrees/Chair;Small bolus sizes;Slow feeding;Remain upright;Oral care 3-4 times/daily with a toothbrush Oral Care Recommendations 2x/day Discontinue PO if patient experiences: Increased temperature;Increased coughing;Increased fatigue Follow Up will continue to monitor pt  SLP IP Adult Recommendations - 01/08/24 0935    Recommendations  SLP Therapy Frequency 2x per week      SLP IP Adult Inpatient Recommendations - 01/08/24 0935    SLP Recommendations for Inpatient Admission  Swallow Recommendations Recommend continue with dental soft diet with thin liquids, aspiration precautions & feed assist/tray set up. Further, recommend slow rate, small bites and single sips. D/w pt, RN & Dr. Dorris Carnes. Teslya via [facility]. Will continue to monitor  pt.

## 2024-01-08 NOTE — Utilization Review (ED)
 Utilization Review from XsolisPatient Data:  Patient Name: Danielle [name] Age: 68 y.o. DOB: 1956/07/28	 MRN: [name]	 Indicated Status: Inpatient Review Comments: Altered mental status, febrile 101.1, right sided weakness, [place] head negative for acute intracranial abnormality, UA with +leukocytes, chest XR demonstrates mild diffuse interstitial prominence and atelectasis vs pneumonitis, IV antibiotics, telemetry, neurology consulted

## 2024-01-08 NOTE — Significant Event
 Patient with diminished oral intake - ordered finger stick checks q6

## 2024-01-08 NOTE — Other
 Bedside PICC placement received, awaiting for the conservator to call back to sign the consent.

## 2024-01-08 NOTE — Other
 PHARMACY-ASSISTED MEDICATION REPORTPharmacist review of the best possible medication history obtained by the pharmacy medication history technician has been performed.  I have updated the home medication list and identified the following information that may be relevant to this admission.Home medication list updated and reviewed. Medication history completed through Transfer record (W-10) - [facility] name : [facility] Transfer date:01/16/25Current Medications Medication Sig  ferrous gluconate (FERGON) 324 mg (38 mg iron) tablet Take 1 tablet (324 mg total) by mouth daily.  folic acid (FOLVITE) 1 mg tablet Take 1 tablet (1 mg total) by mouth daily.  gabapentin (NEURONTIN) 400 mg capsule Take 1 capsule (400 mg total) by mouth 2 (two) times daily with breakfast and dinner.  magnesium hydroxide (MILK OF MAGNESIA) 400 mg/5 mL suspension Take 30 mLs by mouth daily as needed for constipation.  menthol-zinc oxide (CALMOSEPTIN) 0.44-20.6 % ointment Apply topically every 8 (eight) hours. To Perineum, labia, buttocks  nitrofurantoin (MACRODANTIN) 50 mg capsule Take 1 capsule (50 mg total) by mouth daily.  potassium citrate (UROCIT-K) 15 mEq extended release tablet Take 1 tablet (15 mEq total) by mouth daily.  rosuvastatin (CRESTOR) 10 mg tablet Take 1 tablet (10 mg total) by mouth every evening.  ZINC OXIDE TOP Apply topically every 8 (eight) hours. To buttocks Nelva Nay, PharmD10:28 AM1/17/2025Phone: Available via [facility] ([facility])

## 2024-01-08 NOTE — Plan of Care
 Problem: Adult Inpatient Plan of CareGoal: Plan of Care ReviewOutcome: Interventions implemented as appropriateGoal: Patient-Specific Goal (Individualized)Outcome: Interventions implemented as appropriateGoal: Absence of Hospital-Acquired Illness or InjuryOutcome: Interventions implemented as appropriate Plan of Care Overview/ Patient StatusAdmission Note Nursing Danielle [name] is a 68 y.o. female admitted with a chief complaint of fever and confussion. [name]. Patient is  aox3. RA VSS. Meds given per mar. Ivf NS @ 13ml/hr. Ileostomy bag inplace. Purewick inplace. No c/o distress. Call bell w/in reach. Fall/safety precaution maintain. At 0510 c/o pain on her L big toe scale of 7 give 1x dose of tramadol 25mg  w/ good effect.  Pt is for MRI. Vitals:  01/07/24 1830 01/07/24 1944 01/07/24 2024 01/07/24 2300 BP: 108/63  104/66  Pulse: 75 72 67  Resp: 20  18  Temp:   97.9 ?F (36.6 ?C)  TempSrc:   Oral  SpO2: 99% 97% 99%  Weight:    64.6 kg Height:    5' 10 (1.778 m) Oxygen therapy Oxygen TherapySpO2: 99 %Device (Oxygen Therapy): room airI have reviewed the patient's current medication orders..I have reviewed patient valuables Belongings charted in last 7 days: Patient Valuables   Patient Valuables Flowsheet                    PATIENT VALUABLE(S)       Jewelry disposition At bedside/locker/closet 01/07/24 2315  Vision Disposition At bedside/locker/closet 01/07/24 2315   Clothing Disposition At bedside/locker/closet 01/07/24 2315     Comments:See flowsheets, patient education and plan of care for additional information.

## 2024-01-09 LAB — RESPIRATORY VIRUS PCR PANEL     (BH GH LMW Q YH)
BKR ADENOVIRUS: NOT DETECTED
BKR HUMAN METAPNEUMOVIRUS (HMPV): NOT DETECTED
BKR PARAINFLUENZA VIRUS 1: NOT DETECTED
BKR PARAINFLUENZA VIRUS 2: NOT DETECTED
BKR PARAINFLUENZA VIRUS 3: NOT DETECTED
BKR PARAINFLUENZA VIRUS 4: NOT DETECTED
BKR RHINOVIRUS: NOT DETECTED

## 2024-01-09 LAB — COMPREHENSIVE METABOLIC PANEL
BKR A/G RATIO: 0.6
BKR ALANINE AMINOTRANSFERASE (ALT): 28 U/L (ref 12–78)
BKR ALBUMIN: 2.6 g/dL — ABNORMAL LOW (ref 3.4–5.0)
BKR ALKALINE PHOSPHATASE: 72 U/L (ref 20–120)
BKR ANION GAP: 8 (ref 5–18)
BKR ASPARTATE AMINOTRANSFERASE (AST): 48 U/L — ABNORMAL HIGH (ref 5–37)
BKR AST/ALT RATIO: 1.7
BKR BILIRUBIN TOTAL: 0.2 mg/dL (ref 0.0–1.0)
BKR BLOOD UREA NITROGEN: 10 mg/dL (ref 8–25)
BKR BUN / CREAT RATIO: 10.9 (ref 8.0–25.0)
BKR CALCIUM: 9.5 mg/dL (ref 8.4–10.3)
BKR CHLORIDE: 118 mmol/L — ABNORMAL HIGH (ref 95–115)
BKR CO2: 19 mmol/L — ABNORMAL LOW (ref 21–32)
BKR CREATININE DELTA: 0.12
BKR CREATININE: 0.92 mg/dL (ref 0.50–1.30)
BKR EGFR, CREATININE (CKD-EPI 2021): >60 mL/min/{1.73_m2} (ref >=60)
BKR GLOBULIN: 4.5 g/dL
BKR GLUCOSE: 86 mg/dL (ref 70–100)
BKR OSMOLALITY CALCULATION: 287 mosm/kg (ref 275–295)
BKR POTASSIUM: 4.3 mmol/L (ref 3.5–5.1)
BKR PROTEIN TOTAL: 7.1 g/dL (ref 6.4–8.2)
BKR SODIUM: 145 mmol/L (ref 136–145)

## 2024-01-09 LAB — CBC WITH AUTO DIFFERENTIAL
BKR WAM ABSOLUTE IMMATURE GRANULOCYTES.: 0.02 x 1000/ÂµL (ref 0.00–0.30)
BKR WAM ABSOLUTE LYMPHOCYTE COUNT.: 0.41 x 1000/ÂµL — ABNORMAL LOW (ref 0.60–3.70)
BKR WAM ABSOLUTE NRBC (2 DEC): 0 x 1000/ÂµL (ref 0.00–1.00)
BKR WAM ANC (ABSOLUTE NEUTROPHIL COUNT): 5.6 x 1000/ÂµL (ref 2.00–7.60)
BKR WAM BASOPHIL ABSOLUTE COUNT.: 0.03 x 1000/ÂµL (ref 0.00–1.00)
BKR WAM BASOPHILS: 0.4 % (ref 0.0–1.4)
BKR WAM EOSINOPHIL ABSOLUTE COUNT.: 0.25 x 1000/ÂµL (ref 0.00–1.00)
BKR WAM EOSINOPHILS: 3.6 % (ref 0.0–5.0)
BKR WAM HEMATOCRIT (2 DEC): 33 % — ABNORMAL LOW (ref 35.00–45.00)
BKR WAM HEMOGLOBIN: 10.3 g/dL — ABNORMAL LOW (ref 11.7–15.5)
BKR WAM IMMATURE GRANULOCYTES: 0.3 % (ref 0.0–1.0)
BKR WAM LYMPHOCYTES: 5.9 % — ABNORMAL LOW (ref 17.0–50.0)
BKR WAM MCH (PG): 28.5 pg (ref 27.0–33.0)
BKR WAM MCHC: 31.2 g/dL (ref 31.0–36.0)
BKR WAM MCV: 91.4 fL (ref 80.0–100.0)
BKR WAM MONOCYTE ABSOLUTE COUNT.: 0.69 x 1000/ÂµL (ref 0.00–1.00)
BKR WAM MONOCYTES: 9.9 % (ref 4.0–12.0)
BKR WAM MPV: 9.8 fL (ref 8.0–12.0)
BKR WAM NEUTROPHILS: 79.9 % — ABNORMAL HIGH (ref 39.0–72.0)
BKR WAM NUCLEATED RED BLOOD CELLS: 0 % (ref 0.0–1.0)
BKR WAM PLATELETS: 192 x1000/ÂµL (ref 150–420)
BKR WAM RDW-CV: 14.7 % (ref 11.0–15.0)
BKR WAM RED BLOOD CELL COUNT.: 3.61 M/ÂµL — ABNORMAL LOW (ref 4.00–6.00)
BKR WAM WHITE BLOOD CELL COUNT: 7 x1000/ÂµL (ref 4.0–11.0)

## 2024-01-09 LAB — C-REACTIVE PROTEIN     (CRP): BKR C-REACTIVE PROTEIN: 7.3 mg/dL — ABNORMAL HIGH (ref 0.0–1.0)

## 2024-01-09 LAB — URINE CULTURE

## 2024-01-09 MED ORDER — DEXTROSE 5 % AND 0.45 % SODIUM CHLORIDE INTRAVENOUS SOLUTION
INTRAVENOUS | Status: DC
Start: 2024-01-09 — End: 2024-01-10
  Administered 2024-01-09 – 2024-01-10 (×3): 1000.000 mL/h via INTRAVENOUS

## 2024-01-09 MED ORDER — SODIUM CHLORIDE 0.9 % IV BOLUS FROM BAG
0.9 | INTRAVENOUS | Status: CP
Start: 2024-01-09 — End: ?
  Administered 2024-01-09: 12:00:00 0.9 mL/h via INTRAVENOUS

## 2024-01-09 NOTE — Plan of Care
 Problem: Comorbidity ManagementGoal: Blood Glucose Level Within Target RangeOutcome: Interventions implemented as appropriate Problem: Adult Inpatient Plan of CareGoal: Plan of Care ReviewOutcome: Interventions implemented as appropriateGoal: Patient-Specific Goal (Individualized)Outcome: Interventions implemented as appropriateGoal: Absence of Hospital-Acquired Illness or InjuryOutcome: Interventions implemented as appropriate Problem: WoundGoal: Skin Health and IntegrityOutcome: Interventions implemented as appropriate Problem: Skin Injury Risk IncreasedGoal: Skin Health and IntegrityOutcome: Interventions implemented as appropriate Problem: PneumoniaGoal: Fluid BalanceOutcome: Interventions implemented as appropriateGoal: Effective Oxygenation and VentilationOutcome: Interventions implemented as appropriate Plan of Care Overview/ Patient StatusAssumed care at 1900, on room air, no distress noted. Alert oriented x 2, purewick on draining amber colored urine. With IVF on going. Ileostomy active, had small BM this shift. Took pils whole with water. Made comfortable in bed.

## 2024-01-09 NOTE — Progress Notes
 Medicine Inpatient Progress NoteAttending Provider: Lenn Cal, MDSubjective: BP is on low side. Given IV NS bolus this morning.FSG low. Will switch IV fluids to D5 1/2NS.CRP rising. No fever today.Awake, alert and oriented this morning, but feels weak. Remembers talking to me yesterday, but doesn't remember going for [place] in afternoon.Denies any pain. Has no appetite.Meds: Scheduled Meds:Current Facility-Administered Medications Medication Dose Route Frequency Provider Last Rate Last Admin  apixaban (ELIQUIS) tablet 5 mg  5 mg Oral Q12H Lenn Cal, MD   5 mg at 01/09/24 0854  ertapenem (INVanz) 1,000 mg in sodium chloride 0.9% 50 mL (mini-bag plus)  1,000 mg Intravenous Q24H Lenn Cal, MD 100 mL/hr at 01/08/24 2216 1,000 mg at 01/08/24 2216  [Held by provider] gabapentin (NEURONTIN) capsule 400 mg  400 mg Oral BID WC Aviva Signs, MD   400 mg at 01/08/24 1858  polyethylene glycol (MIRALAX) packet 17 g  17 g Oral Daily Aviva Signs, MD   17 g at 01/09/24 0854  rosuvastatin (CRESTOR) tablet 10 mg  10 mg Oral Nightly Aviva Signs, MD   10 mg at 01/08/24 2118  sodium chloride 0.9 % flush 3 mL  3 mL IV Push Q8H Thampy, Dan Maker, MD   3 mL at 01/07/24 2117  zinc oxide-cod liver oil (DESITIN) 40 % paste   Topical (Top) TID Aviva Signs, MD   Given at 01/09/24 0859 Continuous Infusions: D5 1/2 NS 100 mL/hr (01/09/24 1216) PRN Meds:acetaminophen, acetaminophen, dextrose (GLUCOSE) oral liquid 15 g **OR** fruit juice **OR** skim milk, dextrose (GLUCOSE) oral liquid 30 g **OR** fruit juice, dextrose injection, dextrose injection, glucagon, melatonin, ondansetron **OR** ondansetron (ZOFRAN) IV Push, sodium chlorideObjective: Vitals:Temp:  [97.9 ?F (36.6 ?C)-101.9 ?F (38.8 ?C)] 97.9 ?F (36.6 ?C)Pulse:  [64-84] 71Resp:  [18] 18BP: (89-160)/(58-62) 96/60SpO2: [95 %-100 %] 99 %Device (Oxygen Therapy): room air  I/O's:Intake/Output Summary (Last 24 hours) at 01/09/2024 1226Last data filed at 01/09/2024 0535Gross per 24 hour Intake -- Output 700 ml Net -700 ml  Physical Exam:Not in distress. Fully awake, alert and conversant. Weak.Lungs are clear, no rales, no rhonchi.Heart sounds are regular, rhythmic.Abdomen soft, non-distended, non-tender.Ileostomy in place with stool in a bag.No LE edema, no tenderness.Alert, awake, oriented x3. LE strength 0/5. UE strength 5/5. Labs: Recent Labs Lab 01/16/251529 01/17/250639 01/18/250644 WBC 8.4 7.0 7.0 HGB 10.7* 10.2* 10.3* HCT 34.30* 32.50* 33.00* PLT 215 221 192 NEUTROPHILS 82.5* 66.3 79.9* NA 139 139 145 K 3.7 3.4* 4.3 CL 113 113 118* CO2 19* 19* 19* BUN 13 10 10  CREATININE 0.92 0.80 0.92 ANIONGAP 7 7 8   Recent Labs Lab 01/16/251529 01/17/250639 01/18/250644 ALT 10*  --  28 AST 16  --  48* ALKPHOS 84  --  72 BILITOT 0.1  --  0.2 BILIDIR <0.1  --   --  PROT 7.6  --  7.1 ALBUMIN 3.0*  --  2.6* CALCIUM 9.4 9.5 9.5 PTT 21.7*  --   --  LABPROT 11.1  --   --  INR 1.08  --   --   Recent Labs Lab 01/16/251529 01/17/250639 01/17/251810 01/18/250535 01/18/250644 01/18/251146 01/18/251151 01/18/251214 GLU 85 95 127* 77 86 68* 53* 71 Diagnostics:[place] Chest w IV ContrastResult Date: 1/17/2025STUDY: [place] CHEST W IV CONTRAST, [place] ABDOMEN PELVIS W IV CONTRAST : 01/08/2024. CLINICAL HISTORY: Respiratory illness, nondiagnostic xray COMPARISON: 09/14/2023 TECHNIQUE: After the intravenous administration of 80 mL of Omnipaque 350 a standard post contrast  of the chest, abdomen and pelvis was performed. Individual dose optimization technique was  used for the performance of this procedure, and one or more of the following dose reduction techniques was used: Automated exposure control and/or adjustment of the MA and/or kV according to patient size and/or the use of iterative reconstruction technique. FINDINGS: Devices/Tubes/Lines: None. CHEST: Lungs: Mild respiratory motion artifact. No focal consolidation. Bibasilar atelectasis. Pleura: No pleural effusion or pneumothorax. Mediastinum: No thyroid nodules. Normal heart size. No pericardial effusion. No aortic aneurysm. Lymph Nodes: No supraclavicular, axillary, mediastinal, or hilar lymphadenopathy. ABDOMEN/PELVIS Liver: Normal attenuation. Biliary: No biliary ductal dilatation. Redemonstrated dependent gallstones without gallbladder wall thickening or pericholecystic fluid. Spleen: No splenomegaly. Pancreas: No mass or ductal dilatation. Adrenal Glands: No nodules. Kidneys/Ureters: Persistent moderate left hydronephrosis with delayed nephrogram. Mild perinephric fluid appears new as compared to prior exam. Left nephroureteral stent again in place without evidence of obstructing stone or lesion. Stable bilateral nonobstructing renal stones. No solid masses.. Bowel: Status post right lower quadrant ileostomy. No bowel wall thickening or obstruction. Colonic diverticulosis without evidence of diverticulitis. Peritoneum/Retroperitoneum: No masses, free air, or fluid. Lymph Nodes: No lymphadenopathy. Pelvic Organs/Bladder: Unchanged calcified uterine fibroids. No bladder wall thickening. Vessels: No abdominal aortic aneurysm. BONES/SOFT TISSUES:  No soft tissue mass or aggressive osseous lesion. 1. Persistent moderate left hydronephrosis with nephroureteral stent in place. Mild perinephric fluid is new compared to prior exam. No focal parenchymal hypoenhancement, although pyelonephritis is difficult to exclude; correlate with urinalysis. 2. Additional stable chronic findings as above. 3. No acute findings in the chest. [facility] Radiology Notify System Classification: Urgent result. Reported and signed by:  Mirian Mo [place] Abdomen Pelvis w IV ContrastResult Date: 1/17/2025STUDY: [place] CHEST W IV CONTRAST, [place] ABDOMEN PELVIS W IV CONTRAST : 01/08/2024. CLINICAL HISTORY: Respiratory illness, nondiagnostic xray COMPARISON: 09/14/2023 TECHNIQUE: After the intravenous administration of 80 mL of Omnipaque 350 a standard post contrast [place] of the chest, abdomen and pelvis was performed. Individual dose optimization technique was used for the performance of this procedure, and one or more of the following dose reduction techniques was used: Automated exposure control and/or adjustment of the MA and/or kV according to patient size and/or the use of iterative reconstruction technique. FINDINGS: Devices/Tubes/Lines: None. CHEST: Lungs: Mild respiratory motion artifact. No focal consolidation. Bibasilar atelectasis. Pleura: No pleural effusion or pneumothorax. Mediastinum: No thyroid nodules. Normal heart size. No pericardial effusion. No aortic aneurysm. Lymph Nodes: No supraclavicular, axillary, mediastinal, or hilar lymphadenopathy. ABDOMEN/PELVIS Liver: Normal attenuation. Biliary: No biliary ductal dilatation. Redemonstrated dependent gallstones without gallbladder wall thickening or pericholecystic fluid. Spleen: No splenomegaly. Pancreas: No mass or ductal dilatation. Adrenal Glands: No nodules. Kidneys/Ureters: Persistent moderate left hydronephrosis with delayed nephrogram. Mild perinephric fluid appears new as compared to prior exam. Left nephroureteral stent again in place without evidence of obstructing stone or lesion. Stable bilateral nonobstructing renal stones. No solid masses.. Bowel: Status post right lower quadrant ileostomy. No bowel wall thickening or obstruction. Colonic diverticulosis without evidence of diverticulitis. Peritoneum/Retroperitoneum: No masses, free air, or fluid. Lymph Nodes: No lymphadenopathy. Pelvic Organs/Bladder: Unchanged calcified uterine fibroids. No bladder wall thickening. Vessels: No abdominal aortic aneurysm. BONES/SOFT TISSUES:  No soft tissue mass or aggressive osseous lesion. 1. Persistent moderate left hydronephrosis with nephroureteral stent in place. Mild perinephric fluid is new compared to prior exam. No focal parenchymal hypoenhancement, although pyelonephritis is difficult to exclude; correlate with urinalysis. 2. Additional stable chronic findings as above. 3. No acute findings in the chest. [facility] Radiology Notify System Classification: Urgent result. Reported and signed by:  Mirian Mo MRI Brain wo IV  ContrastResult Date: 1/17/2025MRI BRAIN WITHOUT IV CONTRAST: 01/08/2024. CLINICAL HISTORY:  Mental status change, unknown cause. COMPARISON: [place] 01/07/2024. FINDINGS: The sagittal sequence demonstrates no pituitary or pineal mass.  The craniovertebral junction is normal without Chiari malformation.  The corpus callosum is normally formed. The ventricles and CSF spaces are prominent, consistent with generalized atrophy. There is some cerebellar atrophy as well. There are confluent foci of increased T2/FLAIR signal in the periventricular white matter related to the sequela of prior small vessel ischemia. The diffusion sequences demonstrate no acute change. There is no mass, mass effect, midline shift or hemorrhage. There is no extra-axial collection. The visualized sinuses and the temporal airspaces are without evidence of inflammatory change.  1. Atrophy. 2. Sequela prior small vessel ischemia. 3. No acute intracranial abnormality. Reported and signed by:  Carolan Clines, MD XR Chest PA or AP-portableResult Date: 1/16/2025History: Stroke. Findings, AP portable chest, 01/07/2024: Date of prior study: 09/25/2023 The patient is rotated to the right. The lung volumes are somewhat low. The cardiomediastinal silhouette is within normal limits. No definite focal consolidation is demonstrated. An area of groundglass opacity in the right lower lung may represent atelectasis versus pneumonitis. Mild diffuse interstitial prominence is seen. The proximal pigtail of a left ureteric stent is noted incidentally in the left upper quadrant of the abdomen. Impression: 1. Limited study demonstrating mild diffuse interstitial prominence and atelectasis versus pneumonitis of the right lung base. 2. No focal consolidation or pleural effusion is demonstrated. Reported and signed by:  Cheri Guppy, MD [place] Head Stroke Code WO IV ContrastResult Date: 1/16/2025CT HEAD WO IV CONTRAST (STROKE) 01/07/2024 3:15 PM HISTORY/INDICATION: Neuro deficit, acute, stroke suspected COMPARISON: [place] head 09/14/2023 TECHNIQUE: [place] images of the head were obtained without IV contrast. Coronal and sagittal reformats were performed. Individual dose optimization technique was used for the performance of this procedure, and one or more of the following dose reduction techniques was used: Automated exposure control and/or adjustment of the MA and/or kV according to patient size and/or the use of iterative reconstruction technique. FINDINGS: There is no acute intracranial hemorrhage. There is no large acute territorial infarct. Gray-white matter differentiation is preserved. There is no extra-axial collection, mass effect or midline shift. Scattered and confluent periventricular hypodensities are nonspecific, commonly seen with chronic ischemic microvascular disease. An enlarged and high riding right jugular bulb is noted. There is mild ventricular dilatation, slightly out of proportion to degree of global cerebral volume loss, raising question of normal pressure hydrocephalus, similar to prior. The basal cisterns are patent. The visualized paranasal sinuses and mastoid air cells are clear, except for mild mucosal thickening in the partially imaged maxillary sinuses. The visualized orbits are unremarkable. There are no acute osseous abnormalities. No acute intracranial abnormality. Chronic and incidental findings as described above. If there is clinical concern for acute ischemic stroke, consider further assessment with MRI. Reported and signed by:  Bonne Dolores, MD  Patient Active Problem List Diagnosis  Iron deficiency anemia  Gastrointestinal hemorrhage, unspecified gastrointestinal hemorrhage type  Adjustment disorder, unspecified  Anemia  GERD (gastroesophageal reflux disease)  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 CODE) (HC Code)  Rheumatoid arthritis involving multiple sites with positive rheumatoid factor (HC Code) (HC CODE) (HC Code)  Kidney stone on left side  Kidney stone  Septic shock (HC Code)  Sepsis due to urinary tract infection (HC Code) (HC CODE) (HC Code)  Sepsis (HC Code)  Renal calculus  Sepsis, due to unspecified organism, unspecified whether acute organ dysfunction present (HC Code) (  HC CODE) (HC Code)  UTI (urinary tract infection)  S/P cystoscopy with ureteral stent placement  UTI due to extended-spectrum beta lactamase (ESBL) producing Escherichia coli  Fever, unspecified fever cause  Metabolic encephalopathy Assessment & Plan: 68 y.o. 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?), RA, perforated diverticulitis, s/p hemicolectomy with ileostomy (2023), severe malnutrition, s/p PEG tube (removed), mild cognitive impairment, wheel chair bound, renal stones, ureteral stents, hx of ESBL, recent admission for sepsis due to acute pyelonephritis, s/p Left ureteral stent replacement,s tone extraction (Dr.Stroumbakis), presented with altered mental status.Neurology Dr.Shapiro consulted for possible stroke.[place] head, MRI brain negative.CXR showed possible infiltrate.UA showed 33 WBC.CRP, procalcitonin rising.Patient was initially started on Ceftriaxone and azithromycin, then switched to Vancomycin and Zosyn for possible HCAP.MRSA negative. [place] chest negative for pneumonia.Vancomycin was stopped.Mental status fluctuates from lethargy and full confusion to normal.Developed new fever.Blood Cx were sent.[place] abdomen pelvis showed new Left perinephric fluid, persistent left hydronephrosis with stent in place.Zosyn switched to Ertapenem given hx of ESBL.Discussed with Urology Dr.Bednarz, recommended to c/w IV a/b and monitor.Called Dr.Walsh to clarify why Eliquis was stopped at [facility]. Dr.Walsh will check records and call back. No reports of recent bleeding. Will re-start Eliquis for now.I have reviewed the patient's problem list and treatment plan and updated them as needed.PlanAltered mental status Acute Metabolic encephalopathy- appreciate neurology consult- [place] head and MRI brain negative- c/w a/bSepsis due to Left pyelonephritisLeft ureteral stentHx of ESBL- f/u final urine Cx- f/u blood Cx, NGTD- discussed with urology Dr.Bednarz, recommended to c/w a/b and observe- s/p ceftiaxone, azithromycin- s/p IV vancomycin and zosyn- c/w Ertapenem D2- c/w IV fluids, bolus as neededHx of DVTs- re-started Eliquis 5mg  BID- monitorAbnormal CXR- [place] chest negative- SLP cleared for soft diet with regular liquids Peripheral neuropathy- HOLD gabapentin for lethargyHLD- c/w crestor ## Diet: Dental soft with thin liquids## Proph: Eliquis## Code: FullSigned:Yolani Vo MDPager 1908January 18, 20257:11 AM

## 2024-01-09 NOTE — Progress Notes
 Neurology Attending Progress Note Interval Events Had signed off yesterday, cnotacted by Dr. Stasia Cavalier due to patient's confusion in the afternoon/evening.Developed fever with rising inflammatory/infectious markers. Restarted on ESBL Subjective Patient this mroning at neurological baseline. Pleasant no complaintsDoes not remember the events of yesterday, believes it is due to infection Exam Awake, Alert Following all commandsFulent and fully oriented PEERL, EOMI, face sym no dysathriaMoves BUE antigravity and strong SILT Neck supple, able to touch chin to chest Imp Overall no concern currently for CNS infection. Suspect that patient is very sensitive to infectious thus causing these alterations of conscious with fever. Would continue with infectious workup and treatment, no indication for LP at this time. Continue to monitor, if patient with further flucatuation of exam, may consider routine EEG to ensure no dischargesContinue with current mgmt at this timePlease contact neurology as needed Odessa Fleming MD Neurology consultant

## 2024-01-09 NOTE — Progress Notes
 Medicine Inpatient Progress NoteAttending Provider: Lenn Cal, MDSubjective: This morning patient was awake, alert, oriented x3, fully conversant and interactive. Denied any pain, SOB.MRI brain negative.Discussed with neurology Dr.Shapiro.Ceftriaxone and azithromycin were switched to Vancomycin and Zosyn for possible HCAP.CRP, procalcitonin rising.Developed fever, lethargy in afternoon.Blood Cx were sent.[place] chest abdomen pelvis showed new Left perinephric fluid, persistent left hydronephrosis with stent in place, no pneumonia.Discussed with Urology Dr.Bednarz, recommended to c/w IV a/b and monitor.Called and left a message for patient's conservator Jacquenette Shone.Called Dr.Walsh to clarify why Eliquis was stopped at [facility]. Dr.Walsh will check records and call back. No reports of recent bleeding. Will re-start Eliquis for now.Meds: Scheduled Meds:Current Facility-Administered Medications Medication Dose Route Frequency Provider Last Rate Last Admin  [START ON 01/09/2024] apixaban (ELIQUIS) tablet 5 mg  5 mg Oral Q12H Lenn Cal, MD      ertapenem Pincus Sanes) 1,000 mg in sodium chloride 0.9% 50 mL (mini-bag plus)  1,000 mg Intravenous Q24H Lenn Cal, MD      [Held by provider] gabapentin (NEURONTIN) capsule 400 mg  400 mg Oral BID WC Thampy, Dan Maker, MD   400 mg at 01/08/24 1858  polyethylene glycol (MIRALAX) packet 17 g  17 g Oral Daily Thampy, Dan Maker, MD      rosuvastatin (CRESTOR) tablet 10 mg  10 mg Oral Nightly Thampy, Dan Maker, MD   10 mg at 01/08/24 2118  sodium chloride 0.9 % flush 3 mL  3 mL IV Push Q8H Thampy, Dan Maker, MD   3 mL at 01/07/24 2117  zinc oxide-cod liver oil (DESITIN) 40 % paste   Topical (Top) TID Aviva Signs, MD   Given at 01/08/24 2123 Continuous Infusions: sodium chloride 75 mL/hr (01/08/24 1726) PRN Meds:acetaminophen, acetaminophen, dextrose (GLUCOSE) oral liquid 15 g **OR** fruit juice **OR** skim milk, dextrose (GLUCOSE) oral liquid 30 g **OR** fruit juice, dextrose injection, dextrose injection, glucagon, melatonin, ondansetron **OR** ondansetron (ZOFRAN) IV Push, sodium chlorideObjective: Vitals:Temp:  [98 ?F (36.7 ?C)-101.9 ?F (38.8 ?C)] 98 ?F (36.7 ?C)Pulse:  [66-84] 74Resp:  [18-20] 18BP: (93-160)/(58-75) 93/58SpO2:  [95 %-99 %] 95 %Device (Oxygen Therapy): room air  I/O's:Intake/Output Summary (Last 24 hours) at 01/08/2024 2154Last data filed at 01/08/2024 1513Gross per 24 hour Intake -- Output 400 ml Net -400 ml  Physical Exam:Not in distress. Fully awake, alert and conversant in AM.Lungs are clear, no rales, no rhonchi.Heart sounds are regular, rhythmic.Abdomen soft, non-distended, non-tender.Ileostomy in place with stool in a bag.No LE edema, no tenderness.Alert, awake, oriented x3. LE strength 0/5. UE strength 5/5. Labs: Recent Labs Lab 01/16/251529 01/17/250639 WBC 8.4 7.0 HGB 10.7* 10.2* HCT 34.30* 32.50* PLT 215 221 NEUTROPHILS 82.5* 66.3 NA 139 139 K 3.7 3.4* CL 113 113 CO2 19* 19* BUN 13 10 CREATININE 0.92 0.80 ANIONGAP 7 7  Recent Labs Lab 01/16/251529 01/17/250639 ALT 10*  --  AST 16  --  ALKPHOS 84  --  BILITOT 0.1  --  BILIDIR <0.1  --  PROT 7.6  --  ALBUMIN 3.0*  --  CALCIUM 9.4 9.5 PTT 21.7*  --  LABPROT 11.1  --  INR 1.08  --   Recent Labs Lab 01/16/251445 01/16/251529 01/17/250639 01/17/251810 GLU 90 85 95 127* Diagnostics:[place] Chest w IV ContrastResult Date: 1/17/2025STUDY: [place] CHEST W IV CONTRAST, [place] ABDOMEN PELVIS W IV CONTRAST : 01/08/2024. CLINICAL HISTORY: Respiratory illness, nondiagnostic xray COMPARISON: 09/14/2023 TECHNIQUE: After the intravenous administration of 80 mL of Omnipaque 350 a standard post contrast [place] of the chest, abdomen and pelvis was performed.  Individual dose optimization technique was used for the performance of this procedure, and one or more of the following dose reduction techniques was used: Automated exposure control and/or adjustment of the MA and/or kV according to patient size and/or the use of iterative reconstruction technique. FINDINGS: Devices/Tubes/Lines: None. CHEST: Lungs: Mild respiratory motion artifact. No focal consolidation. Bibasilar atelectasis. Pleura: No pleural effusion or pneumothorax. Mediastinum: No thyroid nodules. Normal heart size. No pericardial effusion. No aortic aneurysm. Lymph Nodes: No supraclavicular, axillary, mediastinal, or hilar lymphadenopathy. ABDOMEN/PELVIS Liver: Normal attenuation. Biliary: No biliary ductal dilatation. Redemonstrated dependent gallstones without gallbladder wall thickening or pericholecystic fluid. Spleen: No splenomegaly. Pancreas: No mass or ductal dilatation. Adrenal Glands: No nodules. Kidneys/Ureters: Persistent moderate left hydronephrosis with delayed nephrogram. Mild perinephric fluid appears new as compared to prior exam. Left nephroureteral stent again in place without evidence of obstructing stone or lesion. Stable bilateral nonobstructing renal stones. No solid masses.. Bowel: Status post right lower quadrant ileostomy. No bowel wall thickening or obstruction. Colonic diverticulosis without evidence of diverticulitis. Peritoneum/Retroperitoneum: No masses, free air, or fluid. Lymph Nodes: No lymphadenopathy. Pelvic Organs/Bladder: Unchanged calcified uterine fibroids. No bladder wall thickening. Vessels: No abdominal aortic aneurysm. BONES/SOFT TISSUES:  No soft tissue mass or aggressive osseous lesion. 1. Persistent moderate left hydronephrosis with nephroureteral stent in place. Mild perinephric fluid is new compared to prior exam. No focal parenchymal hypoenhancement, although pyelonephritis is difficult to exclude; correlate with urinalysis. 2. Additional stable chronic findings as above. 3. No acute findings in the chest. [facility] Radiology Notify System Classification: Urgent result. Reported and signed by:  Mirian Mo Hilliard Abdomen Pelvis w IV ContrastResult Date: 1/17/2025STUDY: [place] CHEST W IV CONTRAST, [place] ABDOMEN PELVIS W IV CONTRAST : 01/08/2024. CLINICAL HISTORY: Respiratory illness, nondiagnostic xray COMPARISON: 09/14/2023 TECHNIQUE: After the intravenous administration of 80 mL of Omnipaque 350 a standard post contrast [place] of the chest, abdomen and pelvis was performed. Individual dose optimization technique was used for the performance of this procedure, and one or more of the following dose reduction techniques was used: Automated exposure control and/or adjustment of the MA and/or kV according to patient size and/or the use of iterative reconstruction technique. FINDINGS: Devices/Tubes/Lines: None. CHEST: Lungs: Mild respiratory motion artifact. No focal consolidation. Bibasilar atelectasis. Pleura: No pleural effusion or pneumothorax. Mediastinum: No thyroid nodules. Normal heart size. No pericardial effusion. No aortic aneurysm. Lymph Nodes: No supraclavicular, axillary, mediastinal, or hilar lymphadenopathy. ABDOMEN/PELVIS Liver: Normal attenuation. Biliary: No biliary ductal dilatation. Redemonstrated dependent gallstones without gallbladder wall thickening or pericholecystic fluid. Spleen: No splenomegaly. Pancreas: No mass or ductal dilatation. Adrenal Glands: No nodules. Kidneys/Ureters: Persistent moderate left hydronephrosis with delayed nephrogram. Mild perinephric fluid appears new as compared to prior exam. Left nephroureteral stent again in place without evidence of obstructing stone or lesion. Stable bilateral nonobstructing renal stones. No solid masses.. Bowel: Status post right lower quadrant ileostomy. No bowel wall thickening or obstruction. Colonic diverticulosis without evidence of diverticulitis. Peritoneum/Retroperitoneum: No masses, free air, or fluid. Lymph Nodes: No lymphadenopathy. Pelvic Organs/Bladder: Unchanged calcified uterine fibroids. No bladder wall thickening. Vessels: No abdominal aortic aneurysm. BONES/SOFT TISSUES:  No soft tissue mass or aggressive osseous lesion. 1. Persistent moderate left hydronephrosis with nephroureteral stent in place. Mild perinephric fluid is new compared to prior exam. No focal parenchymal hypoenhancement, although pyelonephritis is difficult to exclude; correlate with urinalysis. 2. Additional stable chronic findings as above. 3. No acute findings in the chest. [facility] Radiology Notify System Classification: Urgent result. Reported and signed by:  Madelaine Bhat  [place] MRI Brain wo IV ContrastResult Date: 1/17/2025MRI BRAIN WITHOUT IV CONTRAST: 01/08/2024. CLINICAL HISTORY:  Mental status change, unknown cause. COMPARISON: [place] 01/07/2024. FINDINGS: The sagittal sequence demonstrates no pituitary or pineal mass.  The craniovertebral junction is normal without Chiari malformation.  The corpus callosum is normally formed. The ventricles and CSF spaces are prominent, consistent with generalized atrophy. There is some cerebellar atrophy as well. There are confluent foci of increased T2/FLAIR signal in the periventricular white matter related to the sequela of prior small vessel ischemia. The diffusion sequences demonstrate no acute change. There is no mass, mass effect, midline shift or hemorrhage. There is no extra-axial collection. The visualized sinuses and the temporal airspaces are without evidence of inflammatory change.  1. Atrophy. 2. Sequela prior small vessel ischemia. 3. No acute intracranial abnormality. Reported and signed by:  Carolan Clines, MD XR Chest PA or AP-portableResult Date: 1/16/2025History: Stroke. Findings, AP portable chest, 01/07/2024: Date of prior study: 09/25/2023 The patient is rotated to the right. The lung volumes are somewhat low. The cardiomediastinal silhouette is within normal limits. No definite focal consolidation is demonstrated. An area of groundglass opacity in the right lower lung may represent atelectasis versus pneumonitis. Mild diffuse interstitial prominence is seen. The proximal pigtail of a left ureteric stent is noted incidentally in the left upper quadrant of the abdomen. Impression: 1. Limited study demonstrating mild diffuse interstitial prominence and atelectasis versus pneumonitis of the right lung base. 2. No focal consolidation or pleural effusion is demonstrated. Reported and signed by:  Cheri Guppy, MD [place] Stroke Code WO IV ContrastResult Date: 1/16/2025CT HEAD WO IV CONTRAST (STROKE) 01/07/2024 3:15 PM HISTORY/INDICATION: Neuro deficit, acute, stroke suspected COMPARISON: [place] head 09/14/2023 TECHNIQUE: [place] images of the head were obtained without IV contrast. Coronal and sagittal reformats were performed. Individual dose optimization technique was used for the performance of this procedure, and one or more of the following dose reduction techniques was used: Automated exposure control and/or adjustment of the MA and/or kV according to patient size and/or the use of iterative reconstruction technique. FINDINGS: There is no acute intracranial hemorrhage. There is no large acute territorial infarct. Gray-white matter differentiation is preserved. There is no extra-axial collection, mass effect or midline shift. Scattered and confluent periventricular hypodensities are nonspecific, commonly seen with chronic ischemic microvascular disease. An enlarged and high riding right jugular bulb is noted. There is mild ventricular dilatation, slightly out of proportion to degree of global cerebral volume loss, raising question of normal pressure hydrocephalus, similar to prior. The basal cisterns are patent. The visualized paranasal sinuses and mastoid air cells are clear, except for mild mucosal thickening in the partially imaged maxillary sinuses. The visualized orbits are unremarkable. There are no acute osseous abnormalities. No acute intracranial abnormality. Chronic and incidental findings as described above. If there is clinical concern for acute ischemic stroke, consider further assessment with MRI. Reported and signed by:  Bonne Dolores, MD  Patient Active Problem List Diagnosis  Iron deficiency anemia  Gastrointestinal hemorrhage, unspecified gastrointestinal hemorrhage type  Adjustment disorder, unspecified  Anemia  GERD (gastroesophageal reflux disease)  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 CODE) (HC Code)  Rheumatoid arthritis involving multiple sites with positive rheumatoid factor (HC Code) (HC CODE) (HC Code)  Kidney stone on left side  Kidney stone  Septic shock (HC Code)  Sepsis due to urinary tract infection (HC Code) (HC CODE) (HC Code)  Sepsis (HC Code)  Renal calculus  Sepsis, due to unspecified organism, unspecified whether acute  organ dysfunction present (HC Code) (HC CODE) (HC Code)  UTI (urinary tract infection)  S/P cystoscopy with ureteral stent placement  UTI due to extended-spectrum beta lactamase (ESBL) producing Escherichia coli  Fever, unspecified fever cause  Metabolic encephalopathy Assessment & Plan: 68 y.o. 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?), RA, perforated diverticulitis, s/p hemicolectomy with ileostomy (2023), severe malnutrition, s/p PEG tube (removed), mild cognitive impairment, wheel chair bound, renal stones, ureteral stents, hx of ESBL, recent admission for sepsis due to acute pyelonephritis, s/p Left ureteral stent replacement,s tone extraction (Dr.Stroumbakis), presented with altered mental status.Neurology Dr.Shapiro consulted for possible stroke.[place] head, MRI brain negative.CXR showed possible infiltrate.UA showed 33 WBC.CRP, procalcitonin rising.Patient was initially started on Ceftriaxone and azithromycin, then switched to Vancomycin and Zosyn for possible HCAP.MRSA negative. [place] chest negative for pneumonia.Vancomycin was stopped.Mental status fluctuates from lethargy and full confusion to normal.Developed new fever.Blood Cx were sent.[place] abdomen pelvis showed new Left perinephric fluid, persistent left hydronephrosis with stent in place.Zosyn switched to Ertapenem giving hx of ESBL.Discussed with Urology Dr.Bednarz, recommended to c/w IV a/b and monitor.Called Dr.Walsh to clarify why Eliquis was stopped at [facility]. Dr.Walsh will check records and call back. No reports of recent bleeding. Will re-start Eliquis for now.I have reviewed the patient's problem list and treatment plan and updated them as needed.PlanAltered mental statusAcute Metabolic encephalopathy- appreciate neurology consult- [place] head and MRI brain negative- c/w a/bSepsis due to Left pyelonephritisLeft ureteral stentHx of ESBL- f/u urine Cx- f/u blood Cx, NGTD- discussed with urology Dr.Bednarz, recommended to c/w a/b and observe- s/p ceftiaxone, azithromycin- s/p IV vancomycin and zosyn- start Ertapenem- c/w IV fluidsHx of DVTs- re-start Eliquis 5mg  BID- monitorAbnormal CXR- [place] chest negative- SLP cleared for soft diet with regular liquids Peripheral neuropathy- HOLD gabapentin for lethargyHLD- c/w [place]or ## Diet: Dental soft with thin liquids## Proph: Eliquis## Code: FullSigned:Evalynn Hankins MDPager 1908January 17, 20257:14 AM

## 2024-01-09 NOTE — Plan of Care
 Plan of Care Overview/ Patient StatusAssumed care at 0700. Pt AOX3. VSS on RA. Incontinent care provided. Purewick in place. Appetite varies. Feed assists. Pt noted to be lethargic and has a T-101.9. MD Telsya aware & ordered labs, stat [place] chest and AP & also ordered PRN tylenol. Tylenol administered with effect. Started on NS @ 75cc/hr infusing well. IV ABT in progress with no ADR noted. Incontinent care provided. Purewick in place. Due meds given per [facility]. Call light in reach. POC ongoing. Problem: Comorbidity ManagementGoal: Blood Glucose Level Within Target RangeOutcome: Interventions implemented as appropriate Problem: Adult Inpatient Plan of CareGoal: Plan of Care ReviewOutcome: Interventions implemented as appropriateGoal: Patient-Specific Goal (Individualized)Outcome: Interventions implemented as appropriateGoal: Absence of Hospital-Acquired Illness or InjuryOutcome: Interventions implemented as appropriateGoal: Optimal Comfort and WellbeingOutcome: Interventions implemented as appropriateGoal: Readiness for Transition of CareOutcome: Interventions implemented as appropriate Problem: WoundGoal: Optimal CopingOutcome: Interventions implemented as appropriateGoal: Optimal Functional AbilityOutcome: Interventions implemented as appropriateGoal: Absence of Infection Signs and SymptomsOutcome: Interventions implemented as appropriateGoal: Improved Oral IntakeOutcome: Interventions implemented as appropriateGoal: Optimal Pain Control and FunctionOutcome: Interventions implemented as appropriateGoal: Skin Health and IntegrityOutcome: Interventions implemented as appropriateGoal: Optimal Wound HealingOutcome: Interventions implemented as appropriate Problem: Skin Injury Risk IncreasedGoal: Skin Health and IntegrityOutcome: Interventions implemented as appropriate Problem: Fall Injury RiskGoal: Absence of Fall and Fall-Related InjuryOutcome: Interventions implemented as appropriate

## 2024-01-09 NOTE — Plan of Care
 Plan of Care Overview/ Patient StatusPost Hypoglycemia ManagementGlucose Result: 68		Glucose Confirmed Immediately: 53Patient SymptomsAdrenergic Symptoms: NoneNeurological Symptoms: NoneHypoglycemia Signs:  None Hypoglycemia Treatment GivenBlood Glucose 50-69mg /dL or blood glucose 40-98 mg/dL with symptoms of hypoglycemia administer ONE of the following sources of 15 grams of carbohydrate :Able to tolerate PO 4 ounces(172mL)fruit juice given  Blood Glucose < 50 mg/dL:Not applicable Post Treatmentyes BG retested 15 minutes after treatment 15 minute recheck glucose result: 71Provider Contacted? Milagros Evener of Provider: MD Joneen Boers

## 2024-01-09 NOTE — Plan of Care
 Plan of Care Overview/ Patient Status Assumed care at 0700. Pt alert & responsive. VSS on RA except soft BP, denies chest pain, headache or SOB. MD Telsya aware. Pt noted with FS-68mg /dl. No symptoms noted. Hypoglycemic protocol intiated. MD Laurena Spies aware & IVF changed to D5 1/2NS @ 100cc/hr. FS Q6H continues. Due meds given per [facility]. Total care provided. Purewick in place. Bed alarm on. Call light in reach. POC ongoing. Problem: Comorbidity ManagementGoal: Blood Glucose Level Within Target RangeOutcome: Interventions implemented as appropriate Problem: Adult Inpatient Plan of CareGoal: Plan of Care ReviewOutcome: Interventions implemented as appropriateGoal: Patient-Specific Goal (Individualized)Outcome: Interventions implemented as appropriateGoal: Absence of Hospital-Acquired Illness or InjuryOutcome: Interventions implemented as appropriateGoal: Optimal Comfort and WellbeingOutcome: Interventions implemented as appropriateGoal: Readiness for Transition of CareOutcome: Interventions implemented as appropriate Problem: WoundGoal: Optimal CopingOutcome: Interventions implemented as appropriateGoal: Optimal Functional AbilityOutcome: Interventions implemented as appropriateGoal: Absence of Infection Signs and SymptomsOutcome: Interventions implemented as appropriateGoal: Improved Oral IntakeOutcome: Interventions implemented as appropriateGoal: Optimal Pain Control and FunctionOutcome: Interventions implemented as appropriateGoal: Skin Health and IntegrityOutcome: Interventions implemented as appropriateGoal: Optimal Wound HealingOutcome: Interventions implemented as appropriate Problem: Skin Injury Risk IncreasedGoal: Skin Health and IntegrityOutcome: Interventions implemented as appropriate Problem: Fall Injury RiskGoal: Absence of Fall and Fall-Related InjuryOutcome: Interventions implemented as appropriate Problem: Violence Risk or ActualGoal: Anger and Impulse ControlOutcome: Interventions implemented as appropriate Problem: PneumoniaGoal: Fluid BalanceOutcome: Interventions implemented as appropriateGoal: Absence of Infection Signs and SymptomsOutcome: Interventions implemented as appropriateGoal: Effective Oxygenation and VentilationOutcome: Interventions implemented as appropriate Problem: InfectionGoal: Absence of Infection Signs and SymptomsOutcome: Interventions implemented as appropriate

## 2024-01-10 LAB — COMPREHENSIVE METABOLIC PANEL
BKR A/G RATIO: 0.6
BKR ALANINE AMINOTRANSFERASE (ALT): 24 U/L (ref 12–78)
BKR ALBUMIN: 2.3 g/dL — ABNORMAL LOW (ref 3.4–5.0)
BKR ALKALINE PHOSPHATASE: 58 U/L (ref 20–120)
BKR ANION GAP: 6 (ref 5–18)
BKR ASPARTATE AMINOTRANSFERASE (AST): 27 U/L (ref 5–37)
BKR AST/ALT RATIO: 1.1
BKR BILIRUBIN TOTAL: <0.1 mg/dL (ref 0.0–1.0)
BKR BLOOD UREA NITROGEN: 7 mg/dL — ABNORMAL LOW (ref 8–25)
BKR BUN / CREAT RATIO: 9 (ref 8.0–25.0)
BKR CALCIUM: 9.2 mg/dL (ref 8.4–10.3)
BKR CHLORIDE: 121 mmol/L — ABNORMAL HIGH (ref 95–115)
BKR CO2: 18 mmol/L — ABNORMAL LOW (ref 21–32)
BKR CREATININE DELTA: -0.14
BKR CREATININE: 0.78 mg/dL (ref 0.50–1.30)
BKR EGFR, CREATININE (CKD-EPI 2021): >60 mL/min/{1.73_m2} (ref >=60)
BKR GLOBULIN: 4.1 g/dL
BKR GLUCOSE: 124 mg/dL — ABNORMAL HIGH (ref 70–100)
BKR OSMOLALITY CALCULATION: 288 mosm/kg (ref 275–295)
BKR POTASSIUM: 4 mmol/L (ref 3.5–5.1)
BKR PROTEIN TOTAL: 6.4 g/dL (ref 6.4–8.2)
BKR SODIUM: 145 mmol/L (ref 136–145)

## 2024-01-10 LAB — CBC WITH AUTO DIFFERENTIAL
BKR WAM ABSOLUTE IMMATURE GRANULOCYTES.: 0.01 x 1000/ÂµL (ref 0.00–0.30)
BKR WAM ABSOLUTE LYMPHOCYTE COUNT.: 1.01 x 1000/ÂµL (ref 0.60–3.70)
BKR WAM ABSOLUTE NRBC (2 DEC): 0 x 1000/ÂµL (ref 0.00–1.00)
BKR WAM ANC (ABSOLUTE NEUTROPHIL COUNT): 2.37 x 1000/ÂµL (ref 2.00–7.60)
BKR WAM BASOPHIL ABSOLUTE COUNT.: 0.03 x 1000/ÂµL (ref 0.00–1.00)
BKR WAM BASOPHILS: 0.7 % (ref 0.0–1.4)
BKR WAM EOSINOPHIL ABSOLUTE COUNT.: 0.45 x 1000/ÂµL (ref 0.00–1.00)
BKR WAM EOSINOPHILS: 9.9 % — ABNORMAL HIGH (ref 0.0–5.0)
BKR WAM HEMATOCRIT (2 DEC): 32.6 % — ABNORMAL LOW (ref 35.00–45.00)
BKR WAM HEMOGLOBIN: 10 g/dL — ABNORMAL LOW (ref 11.7–15.5)
BKR WAM IMMATURE GRANULOCYTES: 0.2 % (ref 0.0–1.0)
BKR WAM LYMPHOCYTES: 22.3 % (ref 17.0–50.0)
BKR WAM MCH (PG): 28.1 pg (ref 27.0–33.0)
BKR WAM MCHC: 30.7 g/dL — ABNORMAL LOW (ref 31.0–36.0)
BKR WAM MCV: 91.6 fL (ref 80.0–100.0)
BKR WAM MONOCYTE ABSOLUTE COUNT.: 0.66 x 1000/ÂµL (ref 0.00–1.00)
BKR WAM MONOCYTES: 14.6 % — ABNORMAL HIGH (ref 4.0–12.0)
BKR WAM MPV: 10.3 fL (ref 8.0–12.0)
BKR WAM NEUTROPHILS: 52.3 % (ref 39.0–72.0)
BKR WAM NUCLEATED RED BLOOD CELLS: 0 % (ref 0.0–1.0)
BKR WAM PLATELETS: 184 x1000/ÂµL (ref 150–420)
BKR WAM RDW-CV: 14.7 % (ref 11.0–15.0)
BKR WAM RED BLOOD CELL COUNT.: 3.56 M/ÂµL — ABNORMAL LOW (ref 4.00–6.00)
BKR WAM WHITE BLOOD CELL COUNT: 4.5 x1000/ÂµL (ref 4.0–11.0)

## 2024-01-10 LAB — C-REACTIVE PROTEIN     (CRP): BKR C-REACTIVE PROTEIN: 2.4 mg/dL — ABNORMAL HIGH (ref 0.0–1.0)

## 2024-01-10 LAB — PROCALCITONIN     (BH GH LMW Q YH): BKR PROCALCITONIN: 0.66 ng/mL — ABNORMAL HIGH

## 2024-01-10 MED ORDER — TRAMADOL 50 MG TABLET
50 mg | Freq: Four times a day (QID) | ORAL | Status: AC | PRN
Start: 2024-01-10 — End: 2024-01-15

## 2024-01-10 MED ORDER — KETOTIFEN 0.025 % (0.035 %) EYE DROPS
0.0250.035 % (0.035 %) | Freq: Two times a day (BID) | OPHTHALMIC | Status: CP | PRN
Start: 2024-01-10 — End: 2024-01-16

## 2024-01-10 MED ORDER — CEFTRIAXONE IV PUSH 1000 MG VIAL & NS (ADULTS)
INTRAVENOUS | Status: CP
Start: 2024-01-10 — End: 2024-01-16
  Administered 2024-01-11 – 2024-01-14 (×5): 10.000 mL via INTRAVENOUS

## 2024-01-10 MED ORDER — ORAL/ENTERAL REHYDRATION FLUIDS (ELECTROLYTE DRINK/WATER)
ORAL | Status: AC
Start: 2024-01-10 — End: 2024-01-16

## 2024-01-10 MED ORDER — ACETAMINOPHEN 1,000 MG/100 ML (10 MG/ML) INTRAVENOUS SOLUTION
10 | Freq: Four times a day (QID) | INTRAVENOUS | Status: DC | PRN
Start: 2024-01-10 — End: 2024-01-10

## 2024-01-10 NOTE — Progress Notes
 Medicine Inpatient Progress NoteAttending Provider: Lenn Cal, MDSubjective: No acute events. Awake, alert, conversant.BP stable.Has left hand swelling from IV, assessed by IV RN.New PIV was placed on Right hand.Patient has not much appetite, but willing to drink. Will stop IV fluids and give PO Gatorade.Urine Cx showed Proteus sensitive to Ceftriaxone. Called and updated patient's sister Jacquenette Shone.Meds: Scheduled Meds:Current Facility-Administered Medications Medication Dose Route Frequency Provider Last Rate Last Admin  apixaban (ELIQUIS) tablet 5 mg  5 mg Oral Q12H Lenn Cal, MD   5 mg at 01/10/24 0848  cefTRIAXone (ROCEPHIN) 1 g in sodium chloride 0.9% PF 10 mL (100 mg/mL)  1 g IV Push Q24H Lenn Cal, MD      [Held by provider] gabapentin (NEURONTIN) capsule 400 mg  400 mg Oral BID WC Thampy, Dan Maker, MD   400 mg at 01/08/24 1858  polyethylene glycol (MIRALAX) packet 17 g  17 g Oral Daily Aviva Signs, MD   17 g at 01/09/24 0854  rosuvastatin (CRESTOR) tablet 10 mg  10 mg Oral Nightly Aviva Signs, MD   10 mg at 01/09/24 2048  sodium chloride 0.9 % flush 3 mL  3 mL IV Push Q8H Thampy, Dan Maker, MD   3 mL at 01/10/24 0853  zinc oxide-cod liver oil (DESITIN) 40 % paste   Topical (Top) TID Aviva Signs, MD   Given at 01/10/24 0853 PRN Meds:acetaminophen, dextrose (GLUCOSE) oral liquid 15 g **OR** fruit juice **OR** skim milk, dextrose (GLUCOSE) oral liquid 30 g **OR** fruit juice, dextrose injection, dextrose injection, glucagon, melatonin, ondansetron **OR** ondansetron (ZOFRAN) IV Push, sodium chloride, traMADoLObjective: Vitals:Temp:  [97.5 ?F (36.4 ?C)-98.5 ?F (36.9 ?C)] 98.5 ?F (36.9 ?C)Pulse:  [46-65] 57Resp:  [18-20] 18BP: (99-115)/(58-70) 101/63SpO2:  [97 %-99 %] 97 %Device (Oxygen Therapy): room air  I/O's:Intake/Output Summary (Last 24 hours) at 01/10/2024 1332Last data filed at 01/10/2024 0600Gross per 24 hour Intake 950 ml Output 800 ml Net 150 ml  Physical Exam:Not in distress. Fully awake, alert and conversant. Weak.Lungs are clear, no rales, no rhonchi.Heart sounds are regular, rhythmic.Abdomen soft, non-distended, non-tender.Site of former PEG tube orifice covered with dressing.Ileostomy in place with stool in a bag.No LE edema, no tenderness.Alert, awake, oriented x3. LE strength 0/5. UE strength 5/5. Left hand swelling from IV fluids. No redness or warmth.Labs: Recent Labs Lab 01/17/250639 01/18/250644 01/19/250649 WBC 7.0 7.0 4.5 HGB 10.2* 10.3* 10.0* HCT 32.50* 33.00* 32.60* PLT 221 192 184 NEUTROPHILS 66.3 79.9* 52.3 NA 139 145 145 K 3.4* 4.3 4.0 CL 113 118* 121* CO2 19* 19* 18* BUN 10 10 7* CREATININE 0.80 0.92 0.78 ANIONGAP 7 8 6   Recent Labs Lab 01/16/251529 01/17/250639 01/18/250644 01/19/250649 ALT 10*  --  28 24 AST 16  --  48* 27 ALKPHOS 84  --  72 58 BILITOT 0.1  --  0.2 <0.1 BILIDIR <0.1  --   --   --  PROT 7.6  --  7.1 6.4 ALBUMIN 3.0*  --  2.6* 2.3* CALCIUM 9.4 9.5 9.5 9.2 PTT 21.7*  --   --   --  LABPROT 11.1  --   --   --  INR 1.08  --   --   --   Recent Labs Lab 01/18/251151 01/18/251214 01/18/251333 01/18/251747 01/19/250055 01/19/250610 01/19/250649 01/19/251210 GLU 53* 71 103* 77 92 93 124* 102* Diagnostics:[place] Chest w IV ContrastResult Date: 1/17/2025STUDY: [place] CHEST W IV CONTRAST, [place] ABDOMEN PELVIS W IV CONTRAST : 01/08/2024. CLINICAL HISTORY: Respiratory illness, nondiagnostic xray COMPARISON: 09/14/2023 TECHNIQUE:  After the intravenous administration of 80 mL of Omnipaque 350 a standard post contrast [place] of the chest, abdomen and pelvis was performed. Individual dose optimization technique was used for the performance of this procedure, and one or more of the following dose reduction techniques was used: Automated exposure control and/or adjustment of the MA and/or kV according to patient size and/or the use of iterative reconstruction technique. FINDINGS: Devices/Tubes/Lines: None. CHEST: Lungs: Mild respiratory motion artifact. No focal consolidation. Bibasilar atelectasis. Pleura: No pleural effusion or pneumothorax. Mediastinum: No thyroid nodules. Normal heart size. No pericardial effusion. No aortic aneurysm. Lymph Nodes: No supraclavicular, axillary, mediastinal, or hilar lymphadenopathy. ABDOMEN/PELVIS Liver: Normal attenuation. Biliary: No biliary ductal dilatation. Redemonstrated dependent gallstones without gallbladder wall thickening or pericholecystic fluid. Spleen: No splenomegaly. Pancreas: No mass or ductal dilatation. Adrenal Glands: No nodules. Kidneys/Ureters: Persistent moderate left hydronephrosis with delayed nephrogram. Mild perinephric fluid appears new as compared to prior exam. Left nephroureteral stent again in place without evidence of obstructing stone or lesion. Stable bilateral nonobstructing renal stones. No solid masses.. Bowel: Status post right lower quadrant ileostomy. No bowel wall thickening or obstruction. Colonic diverticulosis without evidence of diverticulitis. Peritoneum/Retroperitoneum: No masses, free air, or fluid. Lymph Nodes: No lymphadenopathy. Pelvic Organs/Bladder: Unchanged calcified uterine fibroids. No bladder wall thickening. Vessels: No abdominal aortic aneurysm. BONES/SOFT TISSUES:  No soft tissue mass or aggressive osseous lesion. 1. Persistent moderate left hydronephrosis with nephroureteral stent in place. Mild perinephric fluid is new compared to prior exam. No focal parenchymal hypoenhancement, although pyelonephritis is difficult to exclude; correlate with urinalysis. 2. Additional stable chronic findings as above. 3. No acute findings in the chest. [facility] Radiology Notify System Classification: Urgent result. Reported and signed by:  Mirian Mo [place] Abdomen Pelvis w IV ContrastResult Date: 1/17/2025STUDY: [place] CHEST W IV CONTRAST, [place] ABDOMEN PELVIS W IV CONTRAST : 01/08/2024. CLINICAL HISTORY: Respiratory illness, nondiagnostic xray COMPARISON: 09/14/2023 TECHNIQUE: After the intravenous administration of 80 mL of Omnipaque 350 a standard post contrast [place] of the chest, abdomen and pelvis was performed. Individual dose optimization technique was used for the performance of this procedure, and one or more of the following dose reduction techniques was used: Automated exposure control and/or adjustment of the MA and/or kV according to patient size and/or the use of iterative reconstruction technique. FINDINGS: Devices/Tubes/Lines: None. CHEST: Lungs: Mild respiratory motion artifact. No focal consolidation. Bibasilar atelectasis. Pleura: No pleural effusion or pneumothorax. Mediastinum: No thyroid nodules. Normal heart size. No pericardial effusion. No aortic aneurysm. Lymph Nodes: No supraclavicular, axillary, mediastinal, or hilar lymphadenopathy. ABDOMEN/PELVIS Liver: Normal attenuation. Biliary: No biliary ductal dilatation. Redemonstrated dependent gallstones without gallbladder wall thickening or pericholecystic fluid. Spleen: No splenomegaly. Pancreas: No mass or ductal dilatation. Adrenal Glands: No nodules. Kidneys/Ureters: Persistent moderate left hydronephrosis with delayed nephrogram. Mild perinephric fluid appears new as compared to prior exam. Left nephroureteral stent again in place without evidence of obstructing stone or lesion. Stable bilateral nonobstructing renal stones. No solid masses.. Bowel: Status post right lower quadrant ileostomy. No bowel wall thickening or obstruction. Colonic diverticulosis without evidence of diverticulitis. Peritoneum/Retroperitoneum: No masses, free air, or fluid. Lymph Nodes: No lymphadenopathy. Pelvic Organs/Bladder: Unchanged calcified uterine fibroids. No bladder wall thickening. Vessels: No abdominal aortic aneurysm. BONES/SOFT TISSUES:  No soft tissue mass or aggressive osseous lesion. 1. Persistent moderate left hydronephrosis with nephroureteral stent in place. Mild perinephric fluid is new compared to prior exam. No focal parenchymal hypoenhancement, although pyelonephritis is difficult to exclude; correlate with urinalysis. 2. Additional stable chronic  findings as above. 3. No acute findings in the chest. [facility] Radiology Notify System Classification: Urgent result. Reported and signed by:  Mirian Mo MRI Brain wo IV ContrastResult Date: 1/17/2025MRI BRAIN WITHOUT IV CONTRAST: 01/08/2024. CLINICAL HISTORY:  Mental status change, unknown cause. COMPARISON: [place] 01/07/2024. FINDINGS: The sagittal sequence demonstrates no pituitary or pineal mass.  The craniovertebral junction is normal without Chiari malformation.  The corpus callosum is normally formed. The ventricles and CSF spaces are prominent, consistent with generalized atrophy. There is some cerebellar atrophy as well. There are confluent foci of increased T2/FLAIR signal in the periventricular white matter related to the sequela of prior small vessel ischemia. The diffusion sequences demonstrate no acute change. There is no mass, mass effect, midline shift or hemorrhage. There is no extra-axial collection. The visualized sinuses and the temporal airspaces are without evidence of inflammatory change.  1. Atrophy. 2. Sequela prior small vessel ischemia. 3. No acute intracranial abnormality. Reported and signed by:  Carolan Clines, MD XR Chest PA or AP-portableResult Date: 1/16/2025History: Stroke. Findings, AP portable chest, 01/07/2024: Date of prior study: 09/25/2023 The patient is rotated to the right. The lung volumes are somewhat low. The cardiomediastinal silhouette is within normal limits. No definite focal consolidation is demonstrated. An area of groundglass opacity in the right lower lung may represent atelectasis versus pneumonitis. Mild diffuse interstitial prominence is seen. The proximal pigtail of a left ureteric stent is noted incidentally in the left upper quadrant of the abdomen. Impression: 1. Limited study demonstrating mild diffuse interstitial prominence and atelectasis versus pneumonitis of the right lung base. 2. No focal consolidation or pleural effusion is demonstrated. Reported and signed by:  Cheri Guppy, MD [place] Stroke Code WO IV ContrastResult Date: 1/16/2025CT HEAD WO IV CONTRAST (STROKE) 01/07/2024 3:15 PM HISTORY/INDICATION: Neuro deficit, acute, stroke suspected COMPARISON: [place] head 09/14/2023 TECHNIQUE: [place] images of the head were obtained without IV contrast. Coronal and sagittal reformats were performed. Individual dose optimization technique was used for the performance of this procedure, and one or more of the following dose reduction techniques was used: Automated exposure control and/or adjustment of the MA and/or kV according to patient size and/or the use of iterative reconstruction technique. FINDINGS: There is no acute intracranial hemorrhage. There is no large acute territorial infarct. Gray-white matter differentiation is preserved. There is no extra-axial collection, mass effect or midline shift. Scattered and confluent periventricular hypodensities are nonspecific, commonly seen with chronic ischemic microvascular disease. An enlarged and high riding right jugular bulb is noted. There is mild ventricular dilatation, slightly out of proportion to degree of global cerebral volume loss, raising question of normal pressure hydrocephalus, similar to prior. The basal cisterns are patent. The visualized paranasal sinuses and mastoid air cells are clear, except for mild mucosal thickening in the partially imaged maxillary sinuses. The visualized orbits are unremarkable. There are no acute osseous abnormalities. No acute intracranial abnormality. Chronic and incidental findings as described above. If there is clinical concern for acute ischemic stroke, consider further assessment with MRI. Reported and signed by:  Bonne Dolores, MD  Patient Active Problem List Diagnosis  Iron deficiency anemia  Gastrointestinal hemorrhage, unspecified gastrointestinal hemorrhage type  Adjustment disorder, unspecified  Anemia  GERD (gastroesophageal reflux disease)  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 CODE) (HC Code)  Rheumatoid arthritis involving multiple sites with positive rheumatoid factor (HC Code) (HC CODE) (HC Code)  Kidney stone on left side  Kidney stone  Septic shock (HC Code)  Sepsis due to urinary tract  infection (HC Code) (HC CODE) (HC Code)  Sepsis (HC Code)  Renal calculus  Sepsis, due to unspecified organism, unspecified whether acute organ dysfunction present (HC Code) (HC CODE) (HC Code)  UTI (urinary tract infection)  S/P cystoscopy with ureteral stent placement  UTI due to extended-spectrum beta lactamase (ESBL) producing Escherichia coli  Fever, unspecified fever cause  Metabolic encephalopathy Assessment & Plan: 68 y.o. 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?), RA, perforated diverticulitis, s/p hemicolectomy with ileostomy (2023), severe malnutrition, s/p PEG tube (removed), mild cognitive impairment, wheel chair bound, renal stones, ureteral stents, hx of ESBL, recent admission for sepsis due to acute pyelonephritis, s/p Left ureteral stent replacement,s tone extraction (Dr.Stroumbakis), presented with altered mental status.Neurology Dr.Shapiro consulted for possible stroke.[place] head, MRI brain negative.CXR showed possible infiltrate.UA showed 33 WBC.CRP, procalcitonin were rising.Patient was initially started on Ceftriaxone and azithromycin, then switched to Vancomycin and Zosyn for possible HCAP.MRSA negative. [place] chest negative for pneumonia.Vancomycin was stopped.Mental status fluctuates from lethargy and full confusion to normal.Developed new fever.Blood Cx were sent, NGTD.[place] abdomen pelvis showed new Left perinephric fluid, persistent left hydronephrosis with stent in place.Zosyn switched to Ertapenem given hx of ESBL.Discussed with Urology Dr.Bednarz, recommended to c/w IV a/b and monitor.Urine Cx showed Proteus, sensitive to Ceftriaxone.Called Dr.Walsh to clarify why Eliquis was stopped at [facility]. Dr.Walsh will check records and call back. No reports of recent bleeding. Will re-start Eliquis for now.I have reviewed the patient's problem list and treatment plan and updated them as needed.PlanAltered mental status Acute Metabolic encephalopathy- appreciate neurology consult- [place] head and MRI brain negative- c/w a/bSepsis due to Left pyelonephritisLeft ureteral stentHx of ESBL- f/u blood Cx, NGTD- discussed with urology Dr.Bednarz, recommended to c/w a/b and observe- s/p ceftiaxone, azithromycin- s/p IV vancomycin and zosyn- s/p Ertapenem- urine Cx showed proteus sensitive to Ceftriaxone- re-start Ceftriaxone D4 of a/b- s/p IV fluids with bolusesHx of DVTs- re-started Eliquis 5mg  BID- monitorAbnormal CXR- [place] chest negative- SLP cleared for soft diet with regular liquids Peripheral neuropathy- HOLD gabapentin for AMSHLD- c/w crestor ## Diet: Dental soft with thin liquids## Proph: Eliquis## Code: FullSigned:Diego Delancey MDPager 1908January 19, 20257:58 AM

## 2024-01-10 NOTE — Plan of Care
 Problem: Comorbidity ManagementGoal: Blood Glucose Level Within Target RangeOutcome: Interventions implemented as appropriate Problem: Adult Inpatient Plan of CareGoal: Plan of Care ReviewOutcome: Interventions implemented as appropriateGoal: Patient-Specific Goal (Individualized)Outcome: Interventions implemented as appropriateGoal: Absence of Hospital-Acquired Illness or InjuryOutcome: Interventions implemented as appropriateGoal: Optimal Comfort and WellbeingOutcome: Interventions implemented as appropriateGoal: Readiness for Transition of CareOutcome: Interventions implemented as appropriate Problem: Skin Injury Risk IncreasedGoal: Skin Health and IntegrityOutcome: Interventions implemented as appropriate Problem: PneumoniaGoal: Fluid BalanceOutcome: Interventions implemented as appropriateGoal: Absence of Infection Signs and SymptomsOutcome: Interventions implemented as appropriate Problem: InfectionGoal: Absence of Infection Signs and SymptomsOutcome: Interventions implemented as appropriate Plan of Care Overview/ Patient StatusAssumed care at 1900, patient resting in bed with IVF infusing well. On RA no signs of respiratory distress noted. A/O x3 today and smiling as we talk.Incontinent care done. Purewick on with clear yellow urine. Ileostomy active.Medications given whole with water. Given gatorade and tolerated well.

## 2024-01-10 NOTE — Plan of Care
 Problem: Adult Inpatient Plan of CareGoal: Plan of Care ReviewOutcome: Interventions implemented as appropriateGoal: Absence of Hospital-Acquired Illness or InjuryOutcome: Interventions implemented as appropriate Problem: WoundGoal: Absence of Infection Signs and SymptomsOutcome: Interventions implemented as appropriateGoal: Improved Oral IntakeOutcome: Interventions implemented as appropriate Plan of Care Overview/ Patient StatusReceive patient alert, oriented, responds appropriately, VSS,Afebrile, satting well on RA, denies any pain/discomfort, Ileostomy care performed, loose stools emptied fom baG, total care provided,Safety/fall precautions maintained.

## 2024-01-11 LAB — COMPREHENSIVE METABOLIC PANEL
BKR A/G RATIO: 0.6
BKR ALANINE AMINOTRANSFERASE (ALT): 19 U/L (ref 12–78)
BKR ALBUMIN: 2.5 g/dL — ABNORMAL LOW (ref 3.4–5.0)
BKR ALKALINE PHOSPHATASE: 67 U/L (ref 20–120)
BKR ANION GAP: 6 (ref 5–18)
BKR ASPARTATE AMINOTRANSFERASE (AST): 15 U/L (ref 5–37)
BKR AST/ALT RATIO: 0.8
BKR BILIRUBIN TOTAL: <0.1 mg/dL (ref 0.0–1.0)
BKR BLOOD UREA NITROGEN: 7 mg/dL — ABNORMAL LOW (ref 8–25)
BKR BUN / CREAT RATIO: 9 (ref 8.0–25.0)
BKR CALCIUM: 9.9 mg/dL (ref 8.4–10.3)
BKR CHLORIDE: 120 mmol/L — ABNORMAL HIGH (ref 95–115)
BKR CO2: 20 mmol/L — ABNORMAL LOW (ref 21–32)
BKR CREATININE DELTA: 0
BKR CREATININE: 0.78 mg/dL (ref 0.50–1.30)
BKR EGFR, CREATININE (CKD-EPI 2021): >60 mL/min/{1.73_m2} (ref >=60)
BKR GLOBULIN: 4.3 g/dL
BKR GLUCOSE: 91 mg/dL (ref 70–100)
BKR OSMOLALITY CALCULATION: 288 mosm/kg (ref 275–295)
BKR POTASSIUM: 3.5 mmol/L (ref 3.5–5.1)
BKR PROTEIN TOTAL: 6.8 g/dL (ref 6.4–8.2)
BKR SODIUM: 146 mmol/L — ABNORMAL HIGH (ref 136–145)

## 2024-01-11 LAB — CBC WITH AUTO DIFFERENTIAL
BKR WAM ABSOLUTE IMMATURE GRANULOCYTES.: 0.01 x 1000/ÂµL (ref 0.00–0.30)
BKR WAM ABSOLUTE LYMPHOCYTE COUNT.: 1.3 x 1000/ÂµL (ref 0.60–3.70)
BKR WAM ABSOLUTE NRBC (2 DEC): 0 x 1000/ÂµL (ref 0.00–1.00)
BKR WAM ANC (ABSOLUTE NEUTROPHIL COUNT): 2.35 x 1000/ÂµL (ref 2.00–7.60)
BKR WAM BASOPHIL ABSOLUTE COUNT.: 0.02 x 1000/ÂµL (ref 0.00–1.00)
BKR WAM BASOPHILS: 0.4 % (ref 0.0–1.4)
BKR WAM EOSINOPHIL ABSOLUTE COUNT.: 0.32 x 1000/ÂµL (ref 0.00–1.00)
BKR WAM EOSINOPHILS: 7 % — ABNORMAL HIGH (ref 0.0–5.0)
BKR WAM HEMATOCRIT (2 DEC): 32 % — ABNORMAL LOW (ref 35.00–45.00)
BKR WAM HEMOGLOBIN: 10.1 g/dL — ABNORMAL LOW (ref 11.7–15.5)
BKR WAM IMMATURE GRANULOCYTES: 0.2 % (ref 0.0–1.0)
BKR WAM LYMPHOCYTES: 28.6 % (ref 17.0–50.0)
BKR WAM MCH (PG): 27.8 pg (ref 27.0–33.0)
BKR WAM MCHC: 31.6 g/dL (ref 31.0–36.0)
BKR WAM MCV: 88.2 fL (ref 80.0–100.0)
BKR WAM MONOCYTE ABSOLUTE COUNT.: 0.54 x 1000/ÂµL (ref 0.00–1.00)
BKR WAM MONOCYTES: 11.9 % (ref 4.0–12.0)
BKR WAM MPV: 9.8 fL (ref 8.0–12.0)
BKR WAM NEUTROPHILS: 51.9 % (ref 39.0–72.0)
BKR WAM NUCLEATED RED BLOOD CELLS: 0 % (ref 0.0–1.0)
BKR WAM PLATELETS: 246 x1000/ÂµL (ref 150–420)
BKR WAM RDW-CV: 14.6 % (ref 11.0–15.0)
BKR WAM RED BLOOD CELL COUNT.: 3.63 M/ÂµL — ABNORMAL LOW (ref 4.00–6.00)
BKR WAM WHITE BLOOD CELL COUNT: 4.5 x1000/ÂµL (ref 4.0–11.0)

## 2024-01-11 LAB — C-REACTIVE PROTEIN     (CRP): BKR C-REACTIVE PROTEIN: 0.9 mg/dL (ref 0.0–1.0)

## 2024-01-11 NOTE — Plan of Care
 Plan of Care Overview/ Patient StatusReceived patient alert and responsive in bed. Meds given per MAR. Ostomy care done. Patients needs attended, call light within reach. Patients needs attended, call light within reach at all times. Patient denies pain or discomfort. No s/s of respiratory distress noted. Problem: Adult Inpatient Plan of CareGoal: Patient-Specific Goal (Individualized)Outcome: Interventions implemented as appropriate Problem: Adult Inpatient Plan of CareGoal: Optimal Comfort and WellbeingOutcome: Interventions implemented as appropriate Problem: Comorbidity ManagementGoal: Blood Glucose Level Within Target RangeOutcome: Interventions implemented as appropriate Problem: WoundGoal: Absence of Infection Signs and SymptomsOutcome: Interventions implemented as appropriate Problem: WoundGoal: Optimal Pain Control and FunctionOutcome: Interventions implemented as appropriate Problem: Fall Injury RiskGoal: Absence of Fall and Fall-Related InjuryOutcome: Interventions implemented as appropriate Problem: PneumoniaGoal: Absence of Infection Signs and SymptomsOutcome: Interventions implemented as appropriate Problem: PneumoniaGoal: Effective Oxygenation and VentilationOutcome: Interventions implemented as appropriate

## 2024-01-11 NOTE — Plan of Care
 Problem: Comorbidity ManagementGoal: Blood Glucose Level Within Target RangeOutcome: Interventions implemented as appropriate Problem: Adult Inpatient Plan of CareGoal: Plan of Care ReviewOutcome: Interventions implemented as appropriateGoal: Patient-Specific Goal (Individualized)Outcome: Interventions implemented as appropriateGoal: Absence of Hospital-Acquired Illness or InjuryOutcome: Interventions implemented as appropriateGoal: Optimal Comfort and WellbeingOutcome: Interventions implemented as appropriateGoal: Readiness for Transition of CareOutcome: Interventions implemented as appropriate Problem: WoundGoal: Optimal CopingOutcome: Interventions implemented as appropriateGoal: Optimal Functional AbilityOutcome: Interventions implemented as appropriateGoal: Absence of Infection Signs and SymptomsOutcome: Interventions implemented as appropriateGoal: Improved Oral IntakeOutcome: Interventions implemented as appropriateGoal: Optimal Pain Control and FunctionOutcome: Interventions implemented as appropriateGoal: Skin Health and IntegrityOutcome: Interventions implemented as appropriateGoal: Optimal Wound HealingOutcome: Interventions implemented as appropriate Problem: Skin Injury Risk IncreasedGoal: Skin Health and IntegrityOutcome: Interventions implemented as appropriate Problem: Fall Injury RiskGoal: Absence of Fall and Fall-Related InjuryOutcome: Interventions implemented as appropriate Problem: Violence Risk or ActualGoal: Anger and Impulse ControlOutcome: Interventions implemented as appropriate Problem: PneumoniaGoal: Fluid BalanceOutcome: Interventions implemented as appropriateGoal: Absence of Infection Signs and SymptomsOutcome: Interventions implemented as appropriateGoal: Effective Oxygenation and VentilationOutcome: Interventions implemented as appropriate Problem: InfectionGoal: Absence of Infection Signs and SymptomsOutcome: Interventions implemented as appropriate Plan of Care Overview/ Patient StatusAssumed care of pt 7:00, AOx3, VSS on room air, denies pain or discomfortPO medication whole, appetite good, ostomy intact/patent draining dark brown stoolT/P for comfort, safety precautions in place, call bell within reach, WCTM.

## 2024-01-11 NOTE — Progress Notes
 Medicine Inpatient Progress NoteAttending Provider: Lenn Cal, MDSubjective: Feels well.Denies any pain.BP stable.Appreciate Dr.Bednarz consult. Plan for total 14 days of a/b and f/u with Dr.Stroumbakis as outpatient on 02/02/2024 as scheduled.[facility]ible [facility] in the next couple days if stable.Meds: Scheduled Meds:Current Facility-Administered Medications Medication Dose Route Frequency Provider Last Rate Last Admin  apixaban (ELIQUIS) tablet 5 mg  5 mg Oral Q12H Lenn Cal, MD   5 mg at 01/11/24 1324  cefTRIAXone (ROCEPHIN) 1 g in sodium chloride 0.9% PF 10 mL (100 mg/mL)  1 g IV Push Q24H Lenn Cal, MD   1 g at 01/10/24 2117  [Held by provider] gabapentin (NEURONTIN) capsule 400 mg  400 mg Oral BID WC Thampy, Dan Maker, MD   400 mg at 01/08/24 1858  polyethylene glycol (MIRALAX) packet 17 g  17 g Oral Daily Aviva Signs, MD   17 g at 01/09/24 0854  rosuvastatin (CRESTOR) tablet 10 mg  10 mg Oral Nightly Aviva Signs, MD   10 mg at 01/10/24 2116  sodium chloride 0.9 % flush 3 mL  3 mL IV Push Q8H Thampy, Dan Maker, MD   3 mL at 01/11/24 0636  zinc oxide-cod liver oil (DESITIN) 40 % paste   Topical (Top) TID Aviva Signs, MD   Given at 01/11/24 0939 PRN Meds:acetaminophen, dextrose (GLUCOSE) oral liquid 15 g **OR** fruit juice **OR** skim milk, dextrose (GLUCOSE) oral liquid 30 g **OR** fruit juice, dextrose injection, dextrose injection, glucagon, ketotifen, melatonin, ondansetron **OR** ondansetron (ZOFRAN) IV Push, sodium chloride, traMADoLObjective: Vitals:Temp:  [97.5 ?F (36.4 ?C)-97.8 ?F (36.6 ?C)] 97.8 ?F (36.6 ?C)Pulse:  [56-79] 79Resp:  [18] 18BP: (101-111)/(63-68) 111/68SpO2:  [97 %-100 %] 100 %Device (Oxygen Therapy): room air  I/O's:Intake/Output Summary (Last 24 hours) at 01/11/2024 1752Last data filed at 01/11/2024 0600Gross per 24 hour Intake 480 ml Output 1250 ml Net -770 ml  Physical Exam:Not in distress. Fully awake, alert and conversant. Lungs are clear, no rales, no rhonchi.Heart sounds are regular, rhythmic.Abdomen soft, non-distended, non-tender.Site of former PEG tube orifice covered with dressing.Ileostomy in place with stool in a bag.No LE edema, no tenderness.Alert, awake, oriented x3. LE strength 1-2/5. UE strength 5/5. Left hand swelling from IV fluids. No redness or warmth.Labs: Recent Labs Lab 01/18/250644 01/19/250649 01/20/250616 WBC 7.0 4.5 4.5 HGB 10.3* 10.0* 10.1* HCT 33.00* 32.60* 32.00* PLT 192 184 246 NEUTROPHILS 79.9* 52.3 51.9 NA 145 145 146* K 4.3 4.0 3.5 CL 118* 121* 120* CO2 19* 18* 20* BUN 10 7* 7* CREATININE 0.92 0.78 0.78 ANIONGAP 8 6 6   Recent Labs Lab 01/16/251529 01/17/250639 01/18/250644 01/19/250649 01/20/250616 ALT 10*  --  28 24 19  AST 16  --  48* 27 15 ALKPHOS 84  --  72 58 67 BILITOT 0.1  --  0.2 <0.1 <0.1 BILIDIR <0.1  --   --   --   --  PROT 7.6  --  7.1 6.4 6.8 ALBUMIN 3.0*  --  2.6* 2.3* 2.5* CALCIUM 9.4   < > 9.5 9.2 9.9 PTT 21.7*  --   --   --   --  LABPROT 11.1  --   --   --   --  INR 1.08  --   --   --   --   < > = values in this interval not displayed.  Recent Labs Lab 01/18/251747 01/19/250055 01/19/250610 01/19/250649 01/19/251210 01/19/251851 01/20/250616 01/20/251202 GLU 77 92 93 124* 102* 96 91 113* Diagnostics:[place] Chest w IV ContrastResult Date: 1/17/2025STUDY:   CHEST W IV CONTRAST, [place] ABDOMEN PELVIS W IV CONTRAST : 01/08/2024. CLINICAL HISTORY: Respiratory illness, nondiagnostic xray COMPARISON: 09/14/2023 TECHNIQUE: After the intravenous administration of 80 mL of Omnipaque 350 a standard post contrast [place] of the chest, abdomen and pelvis was performed. Individual dose optimization technique was used for the performance of this procedure, and one or more of the following dose reduction techniques was used: Automated exposure control and/or adjustment of the MA and/or kV according to patient size and/or the use of iterative reconstruction technique. FINDINGS: Devices/Tubes/Lines: None. CHEST: Lungs: Mild respiratory motion artifact. No focal consolidation. Bibasilar atelectasis. Pleura: No pleural effusion or pneumothorax. Mediastinum: No thyroid nodules. Normal heart size. No pericardial effusion. No aortic aneurysm. Lymph Nodes: No supraclavicular, axillary, mediastinal, or hilar lymphadenopathy. ABDOMEN/PELVIS Liver: Normal attenuation. Biliary: No biliary ductal dilatation. Redemonstrated dependent gallstones without gallbladder wall thickening or pericholecystic fluid. Spleen: No splenomegaly. Pancreas: No mass or ductal dilatation. Adrenal Glands: No nodules. Kidneys/Ureters: Persistent moderate left hydronephrosis with delayed nephrogram. Mild perinephric fluid appears new as compared to prior exam. Left nephroureteral stent again in place without evidence of obstructing stone or lesion. Stable bilateral nonobstructing renal stones. No solid masses.. Bowel: Status post right lower quadrant ileostomy. No bowel wall thickening or obstruction. Colonic diverticulosis without evidence of diverticulitis. Peritoneum/Retroperitoneum: No masses, free air, or fluid. Lymph Nodes: No lymphadenopathy. Pelvic Organs/Bladder: Unchanged calcified uterine fibroids. No bladder wall thickening. Vessels: No abdominal aortic aneurysm. BONES/SOFT TISSUES:  No soft tissue mass or aggressive osseous lesion. 1. Persistent moderate left hydronephrosis with nephroureteral stent in place. Mild perinephric fluid is new compared to prior exam. No focal parenchymal hypoenhancement, although pyelonephritis is difficult to exclude; correlate with urinalysis. 2. Additional stable chronic findings as above. 3. No acute findings in the chest. [facility] Radiology Notify System Classification: Urgent result. Reported and signed by:  Mirian Mo [place] Abdomen Pelvis w IV ContrastResult Date: 1/17/2025STUDY: [place] CHEST W IV CONTRAST, [place] ABDOMEN PELVIS W IV CONTRAST : 01/08/2024. CLINICAL HISTORY: Respiratory illness, nondiagnostic xray COMPARISON: 09/14/2023 TECHNIQUE: After the intravenous administration of 80 mL of Omnipaque 350 a standard post contrast  of the chest, abdomen and pelvis was performed. Individual dose optimization technique was used for the performance of this procedure, and one or more of the following dose reduction techniques was used: Automated exposure control and/or adjustment of the MA and/or kV according to patient size and/or the use of iterative reconstruction technique. FINDINGS: Devices/Tubes/Lines: None. CHEST: Lungs: Mild respiratory motion artifact. No focal consolidation. Bibasilar atelectasis. Pleura: No pleural effusion or pneumothorax. Mediastinum: No thyroid nodules. Normal heart size. No pericardial effusion. No aortic aneurysm. Lymph Nodes: No supraclavicular, axillary, mediastinal, or hilar lymphadenopathy. ABDOMEN/PELVIS Liver: Normal attenuation. Biliary: No biliary ductal dilatation. Redemonstrated dependent gallstones without gallbladder wall thickening or pericholecystic fluid. Spleen: No splenomegaly. Pancreas: No mass or ductal dilatation. Adrenal Glands: No nodules. Kidneys/Ureters: Persistent moderate left hydronephrosis with delayed nephrogram. Mild perinephric fluid appears new as compared to prior exam. Left nephroureteral stent again in place without evidence of obstructing stone or lesion. Stable bilateral nonobstructing renal stones. No solid masses.. Bowel: Status post right lower quadrant ileostomy. No bowel wall thickening or obstruction. Colonic diverticulosis without evidence of diverticulitis. Peritoneum/Retroperitoneum: No masses, free air, or fluid. Lymph Nodes: No lymphadenopathy. Pelvic Organs/Bladder: Unchanged calcified uterine fibroids. No bladder wall thickening. Vessels: No abdominal aortic aneurysm. BONES/SOFT TISSUES:  No soft tissue mass or aggressive osseous lesion. 1. Persistent moderate left hydronephrosis with nephroureteral stent in place. Mild perinephric fluid is new  compared to prior exam. No focal parenchymal hypoenhancement, although pyelonephritis is difficult to exclude; correlate with urinalysis. 2. Additional stable chronic findings as above. 3. No acute findings in the chest. [facility] Radiology Notify System Classification: Urgent result. Reported and signed by:  Mirian Mo MRI Brain wo IV ContrastResult Date: 1/17/2025MRI BRAIN WITHOUT IV CONTRAST: 01/08/2024. CLINICAL HISTORY:  Mental status change, unknown cause. COMPARISON: [place] 01/07/2024. FINDINGS: The sagittal sequence demonstrates no pituitary or pineal mass.  The craniovertebral junction is normal without Chiari malformation.  The corpus callosum is normally formed. The ventricles and CSF spaces are prominent, consistent with generalized atrophy. There is some cerebellar atrophy as well. There are confluent foci of increased T2/FLAIR signal in the periventricular white matter related to the sequela of prior small vessel ischemia. The diffusion sequences demonstrate no acute change. There is no mass, mass effect, midline shift or hemorrhage. There is no extra-axial collection. The visualized sinuses and the temporal airspaces are without evidence of inflammatory change.  1. Atrophy. 2. Sequela prior small vessel ischemia. 3. No acute intracranial abnormality. Reported and signed by:  Carolan Clines, MD XR Chest PA or AP-portableResult Date: 1/16/2025History: Stroke. Findings, AP portable chest, 01/07/2024: Date of prior study: 09/25/2023 The patient is rotated to the right. The lung volumes are somewhat low. The cardiomediastinal silhouette is within normal limits. No definite focal consolidation is demonstrated. An area of groundglass opacity in the right lower lung may represent atelectasis versus pneumonitis. Mild diffuse interstitial prominence is seen. The proximal pigtail of a left ureteric stent is noted incidentally in the left upper quadrant of the abdomen. Impression: 1. Limited study demonstrating mild diffuse interstitial prominence and atelectasis versus pneumonitis of the right lung base. 2. No focal consolidation or pleural effusion is demonstrated. Reported and signed by:  Cheri Guppy, MD [place] Head Stroke Code WO IV ContrastResult Date: 1/16/2025CT HEAD WO IV CONTRAST (STROKE) 01/07/2024 3:15 PM HISTORY/INDICATION: Neuro deficit, acute, stroke suspected COMPARISON: [place] head 09/14/2023 TECHNIQUE: [place] images of the head were obtained without IV contrast. Coronal and sagittal reformats were performed. Individual dose optimization technique was used for the performance of this procedure, and one or more of the following dose reduction techniques was used: Automated exposure control and/or adjustment of the MA and/or kV according to patient size and/or the use of iterative reconstruction technique. FINDINGS: There is no acute intracranial hemorrhage. There is no large acute territorial infarct. Gray-white matter differentiation is preserved. There is no extra-axial collection, mass effect or midline shift. Scattered and confluent periventricular hypodensities are nonspecific, commonly seen with chronic ischemic microvascular disease. An enlarged and high riding right jugular bulb is noted. There is mild ventricular dilatation, slightly out of proportion to degree of global cerebral volume loss, raising question of normal pressure hydrocephalus, similar to prior. The basal cisterns are patent. The visualized paranasal sinuses and mastoid air cells are clear, except for mild mucosal thickening in the partially imaged maxillary sinuses. The visualized orbits are unremarkable. There are no acute osseous abnormalities. No acute intracranial abnormality. Chronic and incidental findings as described above. If there is clinical concern for acute ischemic stroke, consider further assessment with MRI. Reported and signed by:  Bonne Dolores, MD  Patient Active Problem List Diagnosis  Iron deficiency anemia  Gastrointestinal hemorrhage, unspecified gastrointestinal hemorrhage type  Adjustment disorder, unspecified  Anemia  GERD (gastroesophageal reflux disease)  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 CODE) (HC Code)  Rheumatoid arthritis involving multiple sites with positive rheumatoid factor (HC Code) (HC CODE) (HC  Code)  Kidney stone on left side  Kidney stone  Septic shock (HC Code)  Sepsis due to urinary tract infection (HC Code) (HC CODE) (HC Code)  Sepsis (HC Code)  Renal calculus  Sepsis, due to unspecified organism, unspecified whether acute organ dysfunction present (HC Code) (HC CODE) (HC Code)  UTI (urinary tract infection)  S/P cystoscopy with ureteral stent placement  UTI due to extended-spectrum beta lactamase (ESBL) producing Escherichia coli  Fever, unspecified fever cause  Metabolic encephalopathy Assessment & Plan: 68 y.o. female with PMHx of hx of multiple DVTs, RA, perforated diverticulitis, s/p hemicolectomy with ileostomy (2023), severe malnutrition, s/p PEG tube (removed), mild cognitive impairment, wheel chair bound, renal stones, ureteral stents, hx of ESBL, recent admission for sepsis due to acute pyelonephritis, s/p Left ureteral stent replacement,s tone extraction (Dr.Stroumbakis), presented with altered mental status.Neurology Dr.Shapiro consulted for possible stroke.[place] head, MRI brain negative.CXR showed possible infiltrate.UA showed 33 WBC.CRP, procalcitonin were rising.Patient was initially started on Ceftriaxone and azithromycin, then switched to Vancomycin and Zosyn for possible HCAP.MRSA negative. [place] chest negative for pneumonia.Vancomycin was stopped.Mental status fluctuated from lethargy and full confusion to normal.Developed new fever.Blood Cx were sent, NGTD.[place] abdomen pelvis showed new Left perinephric fluid, persistent left hydronephrosis with stent in place.Zosyn switched to Ertapenem given hx of ESBL.Urine cx showed proteus, sensitive to ceftriaxone, switched back to ceftriaxone.Urology Dr.Bednarz consulted. Plan for total 14 days of a/b and f/u with Dr.Stroumbakis as outpatient on 02/02/2024 as scheduled, unless patient's condition worsens.On [facility] med rec, no Eliquis was listed. Called Dr.Walsh to clarify if Eliquis was stopped intentionally at [facility]. Dr.Walsh was going to check records and call back (awaiting a call). No reports of recent bleeding. Patient repors multiple DVTs in a past, thinks someone told her at some point she may stop Eliquis (she is forgetful at times), but last DVT was seen on 05/24/2023. She was admitted in 09/2023 for an acute pyelonephritis, was discharged on Elqiuis. Eliquis was re-started.I have reviewed the patient's problem list and treatment plan and updated them as needed.PlanAltered mental status - resolvedAcute Metabolic encephalopathy- appreciate neurology consult- [place] head and MRI brain negative- c/w a/bSepsis due to Left pyelonephritisLeft ureteral stentHx of ESBLRenal stones- f/u blood Cx, NGTD- discussed with urology Dr.Bednarz, recommended to c/w a/b and observe- s/p ceftiaxone, azithromycin- s/p IV vancomycin and zosyn- s/p Ertapenem- urine Cx showed proteus sensitive to Ceftriaxone- c/w Ceftriaxone D5/14 of a/b- s/p IV fluids with boluses- f/u with Dr.Stroumbakis on 02/02/2024 as outpatient- appreciate Urology Dr.Bednarz consultHx of multiple DVTs- re-started Eliquis 5mg  BID- monitorAbnormal CXR-  chest negative- SLP cleared for soft diet with regular liquids Peripheral neuropathy- re-start gabapentin HLD- c/w crestor ## Diet: Dental soft with thin liquids## Proph: Eliquis## Code: FullSigned:Delaynie Stetzer MDPager 1908January 20, 20257:28 AM

## 2024-01-11 NOTE — Other
 [facility] Consult Information: Consultation requested by: Dr. Link Snuffer for consultation: UTI/PyelonephritisSource of Information: Doctor and EMR/Previous RecordPresentation History: 68 y.o. 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 She was admitted to the medicine service for concern of pneumonia vs pyelonephritis as source for AMS. She is currently on broad spectrum abx. Her [place] shows a well-positioned LEFT ureteral stent with LEFT hydronephrosis as well as LEFT perinephric stranding. Urology asked to comment on imaging and any additional interventions. Review of films suggested no acute obstruction as stable appearance of hydronephrosis that was exhibited in [place] imaging in September and again in October. In the absence of acute or worsened obstruction recommended initial management strategy of abx and supportive care for pyelonephritis.Urologic hx is notable for nephrolithiasis with presentation for a RIGHT obstructing UVJ stone and known non-obstructing LEFT mid ureteral stone on 06/26/2023 she was taken to the OR for concern of UTI sepsis at that time and bilateral JJ stents were placed on 06/27/2023. Spontaneously passed the RIGHT ureteral stone. Returned for RIGHT stent removal and LEFT ureteroscopy on 07/16/2023 with Dr. Hulen Luster. This procedure was remarkable for significant stricture and impaction. Due to concern for ureteral perforation a stent was left and procedure was aborted. Subsequent [place] showed no hydronephrosis, well positioned stent, persistent mid ureteral stone, and non obstructing calculi in the LEFT kidney. Plan was to return for repeat ureteroscopy after adequate time for healing. In that interim she presented to the ER on 09/14/2023 with concern for pyelonephritis. Found to have ESBL bacteremia ad started on ertapenem. [place] imaging at that visit identified moderate LEFT hydroureteronephrosis and evidence of perinephric stranding/ureteral thickening. Stent was in good position. She was treated with abx and returned to the OR with Dr. Hulen Luster on 10/3 for LEFT URS/LL. This procedure was successful in identification of an impacted/submucosal stone which was removed. No reported extravasation. Stent was exchanged on 10/03 with plan to keep at least 6 weeks. Returned on 10/28/2023 where images and hx were reviewed. Cystoscopy showed no distal encrustation. Plan was to manage her ureteral stricture with chronic stent exchanges. Patient seen at bedside reports significant improvement with IV abx. Denies any confusion episodes, nausea, emesis, fevers, chills, flank pain, dysuria. She would like to continue plan as arrange with Dr. Hulen Luster for chronic stent with routine exchanges for the time being. 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 (HC Code) (HC CODE) (HC Code), Dysphagia, Encephalopathy, ESBL (extended spectrum beta-lactamase) producing bacteria infection, GERD (gastroesophageal reflux disease), Hypercalcemia, 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 CODE) (HC Code), Incontinent of urine, Paraplegia (HC Code), and Rheumatoid arthritis (HC Code).Past Surgical History  has a past surgical history that includes Band hemorrhoidectomy; Laparoscopic salpingoopherectomy (1986); Cystectomy (1972); laparotomy (2022); partial colectomy (2022); Upper gastrointestinal endoscopy; PEG tube placement (01/2022); and Ileostomy.Family History family history is not on file.Social History  reports that she has never smoked. She has never used  smokeless tobacco. She reports that she does not currently use alcohol. She reports that she does not use drugs.Inpatient Medications:Current Facility-Administered Medications Medication Dose Route Frequency Provider Last Rate Last Admin  apixaban (ELIQUIS) tablet 5 mg  5 mg Oral Q12H Lenn Cal, MD   5 mg at 01/10/24 0848  cefTRIAXone (ROCEPHIN) 1 g in sodium chloride 0.9% PF 10 mL (100 mg/mL)  1 g IV Push Q24H Lenn Cal, MD      [Held by provider] gabapentin (NEURONTIN) capsule 400 mg  400 mg Oral BID WC Thampy, Dan Maker, MD   400 mg at 01/08/24 1858  polyethylene glycol (MIRALAX) packet 17 g  17 g Oral Daily Aviva Signs, MD   17 g at 01/09/24 0854  rosuvastatin (CRESTOR) tablet 10 mg  10 mg Oral Nightly Aviva Signs, MD   10 mg at 01/09/24 2048  sodium chloride 0.9 % flush 3 mL  3 mL IV Push Q8H Thampy, Dan Maker, MD   3 mL at 01/10/24 0853  zinc oxide-cod liver oil (DESITIN) 40 % paste   Topical (Top) TID Aviva Signs, MD   Given at 01/10/24 1351  Review of Systems: CONSTITUTIONAL Negative for weight loss, fever/chills, night sweats. EYES: Negative for blurred or double vision. ENT: Negative for ringing, hoarseness or dysphagia. CARDIOVASCULAR: Negative for fatigue, chest pains, palpitations or claudication. RESPIRATORY Negative for cough, dyspnea, orthopnea, or hemoptysis. GI Negative for anorexia, weight loss, indigestion, nausea, vomiting, constipation or diarrhea. GU: Negative for frequency, nocturia, or hematuria.  BREAST: Negative for tenderness, nipple discharge or gynecomastia. ENDOCRINE: Negative for diabetes, thyroid, and pituitary problems. MUSCULOSKELETAL: Negative for arthritis, gout, or bone pain. SKIN: Negative for itching, bruising, or rashes. NEUROLOGIC: Negative for headaches, dizziness, numbness, tingling, mental status changes. PSYCHIATRIC: Negative for anxiety or depression. HEMATOLOGIC: Negative for history of anemia or thrombocytopenia. PAIN: No pain or discomfort. Physical Exam: Vitals:Last 24 hours: Temp:  [98 ?F (36.7 ?C)-98.5 ?F (36.9 ?C)] 98.5 ?F (36.9 ?C)Pulse:  [46-65] 57Resp:  [18-20] 18BP: (99-115)/(62-70) 101/63SpO2:  [97 %-98 %] 97 %Intake/Output:No intake/output data recorded.Physical Examination:     GENERAL: Well appearing, no acute distress. EYES: Anicteric sclera, Conjunctivae pink. ENT: Oral Cavity is clear. No evidence of thrush.  NECK: There is no adenopathy in the head or neck area. No palpable thyroid nodules. Trachea is midline. HEART: Regular rate and rhythm, no LE edema LUNGS: Clear to auscultation. No wheezes or rales. No dullness to percussion. ABDOMEN: Soft and non-tender EXTREMITIES: No clubbing, edema, or cyanosis. MUSCULOSKELETAL: There is no bony tenderness. No deformities. Overall, patient ambulates without any difficulty. SKIN There are no suspicious lesions, no nodules, no signs of infection. LYMPH NODES: No cervical, supraclavicular, axillary and inguinal adenopathy, . PSYCHIATRIC: The patient is pleasant and cooperative.  No overt signs of anxiety or depression.  GENITAL: No CVA Tenderness Review of Labs/Diagnostics: Lab Review:Lab Results Component Value Date  WBC 4.5 01/10/2024  WBC 7.0 01/09/2024  WBC 7.0 01/08/2024  WBC 8.4 01/07/2024  WBC 4.3 06/15/2023  HGB 10.0 (L) 01/10/2024  HGB 10.3 (L) 01/09/2024  HGB 10.2 (L) 01/08/2024 HGB 10.7 (L) 01/07/2024  HGB 10.4 (L) 06/15/2023  Lab Results Component Value Date  CREATININE 0.78 01/10/2024  CREATININE 0.92 01/09/2024  CREATININE 0.80 01/08/2024  CREATININE 0.92 01/07/2024 Last 3 Platelets: No components found for: LABPLATUrine Culture 1/16/2025Urine Culture, Routine 50,000-99,000 CFU/mL Proteus mirabilis Abnormal   Culture, organism identification, and/or susceptibility results may have been generated with a non-FDA approved method. All methods used in  this report have been validated by [facility] for clinical use.  Resulting Agency: [facility] Susceptibility Proteus mirabilis   MIC SUSCEPTIBILITY   Amoxicillin + Clavulanate Susceptible   Ampicillin Susceptible   Ampicillin + Sulbactam Susceptible   Cefazolin Susceptible 1   Ceftriaxone Susceptible   Ciprofloxacin Resistant   Gentamicin Susceptible   Nitrofurantoin Resistant   Piperacillin + Tazobactam Susceptible   Tobramycin Intermediate   Trimethoprim + Sulfamethoxazole Susceptible   Diagnostic Review:[facility] 1/17/2025IMPRESSION:1. Persistent moderate left hydronephrosis with nephroureteral stent in place. Mild perinephric fluid is new compared to prior exam. No focal parenchymal hypoenhancement, although pyelonephritis is difficult to exclude; correlate with urinalysis.2. Additional stable chronic findings as above. 3. No acute findings in the chest.Images were personally reviewed and compared to her historic [place] images from September and October. These demonstrate hydronephrosis of this same appearance has been present since the September time frame. Stent exchanged and stone treated on 10/3 with persistent hydronephrosis on 10/4. This is more consistent with a chronic dilation then acute obstruction. Evidence of pyelonephritis.Impression: 68 y.o. 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 Hulen Luster, hx of ESBL, recent admission for sepsis due to acute pyelonephritis, and currently admitted for altered mental status with pyelonephritis as suspected cause. She was admitted to the medicine service for concern of pneumonia vs pyelonephritis as source for AMS. She is currently on broad spectrum abx. Her [place] shows a well-positioned LEFT ureteral stent with LEFT hydronephrosis that appears stable compared to September and October as well as LEFT perinephric stranding-- less likely that there is any acute obstruction. She has clinically improved with antibiotics and feels well. Imaging discussed with patient. She feels well and wishes to forgo any procedures at this time. I discussed the risks associated with UTI and pyelonephritis. In the event she stops clinically improving would recommend earlier stent exchange patient is agreeable to this. In the meantime would recommend continuing to treat for pyelonephritis. Patient should continue plan for follow up with Dr. Hulen Luster for monitoring/exchange of the LEFT JJ stent. Recommendations: - No acute surgical intervention at this time- Continue culture directed abx for minimum course of 14 days.- Alert GU team if clinically stops improving for consideration of earlier stent exchange.- Continue plan for follow up with Dr. Hulen Luster on 02/02/2024.Lanier Clam, MD

## 2024-01-12 ENCOUNTER — Ambulatory Visit: Admit: 2024-01-12 | Payer: PRIVATE HEALTH INSURANCE | Attending: Medical Oncology

## 2024-01-12 ENCOUNTER — Ambulatory Visit: Admit: 2024-01-12 | Payer: PRIVATE HEALTH INSURANCE | Primary: Nephrology

## 2024-01-12 LAB — CBC WITH AUTO DIFFERENTIAL
BKR WAM ABSOLUTE NRBC (2 DEC): 0 x 1000/ÂµL (ref 0.00–1.00)
BKR WAM HEMATOCRIT (2 DEC): 32.1 % — ABNORMAL LOW (ref 35.00–45.00)
BKR WAM HEMOGLOBIN: 9.8 g/dL — ABNORMAL LOW (ref 11.7–15.5)
BKR WAM MCH (PG): 27.2 pg (ref 27.0–33.0)
BKR WAM MCHC: 30.5 g/dL — ABNORMAL LOW (ref 31.0–36.0)
BKR WAM MCV: 89.2 fL (ref 80.0–100.0)
BKR WAM MPV: 9.5 fL (ref 8.0–12.0)
BKR WAM NUCLEATED RED BLOOD CELLS: 0 % (ref 0.0–1.0)
BKR WAM PLATELETS: 277 x1000/ÂµL (ref 150–420)
BKR WAM RDW-CV: 14.5 % (ref 11.0–15.0)
BKR WAM RED BLOOD CELL COUNT.: 3.6 M/ÂµL — ABNORMAL LOW (ref 4.00–6.00)
BKR WAM WHITE BLOOD CELL COUNT: 5 x1000/ÂµL (ref 4.0–11.0)

## 2024-01-12 LAB — MANUAL DIFFERENTIAL
BKR WAM ATYPICAL LYMPHOCYTES (DIFF) 1 DEC: 4.4 % — ABNORMAL HIGH (ref 0.0–1.0)
BKR WAM BANDS (DIFF) (1 DEC): 1.8 % (ref 0.0–10.0)
BKR WAM BASOPHIL - ABS (DIFF) 2 DEC: 0 x 1000/ÂµL (ref 0.00–1.00)
BKR WAM BASOPHILS (DIFF): 0 % (ref 0.0–1.4)
BKR WAM EOSINOPHILS (DIFF) 2 DEC: 0.22 x 1000/ÂµL (ref 0.00–1.00)
BKR WAM EOSINOPHILS (DIFF): 4.4 % (ref 0.0–5.0)
BKR WAM LYMPHOCYTE - ABS (DIFF) 2 DEC: 1.31 x 1000/ÂµL (ref 0.60–3.70)
BKR WAM LYMPHOCYTES (DIFF): 22.1 % (ref 17.0–50.0)
BKR WAM MONOCYTE - ABS (DIFF) 2 DEC: 0.22 x 1000/ÂµL (ref 0.00–1.00)
BKR WAM MONOCYTES (DIFF): 4.4 % (ref 4.0–12.0)
BKR WAM NEUTROPHILS (DIFF): 62.9 % (ref 39.0–72.0)
BKR WAM NEUTROPHILS - ABS (DIFF) 2 DEC: 3.2 x 1000/ÂµL (ref 2.00–7.60)

## 2024-01-12 LAB — COMPREHENSIVE METABOLIC PANEL
BKR A/G RATIO: 0.6
BKR ALANINE AMINOTRANSFERASE (ALT): 14 U/L (ref 12–78)
BKR ALBUMIN: 2.6 g/dL — ABNORMAL LOW (ref 3.4–5.0)
BKR ALKALINE PHOSPHATASE: 65 U/L (ref 20–120)
BKR ANION GAP: 4 — ABNORMAL LOW (ref 5–18)
BKR ASPARTATE AMINOTRANSFERASE (AST): 9 U/L (ref 5–37)
BKR AST/ALT RATIO: 0.6
BKR BILIRUBIN TOTAL: <0.1 mg/dL (ref 0.0–1.0)
BKR BLOOD UREA NITROGEN: 10 mg/dL (ref 8–25)
BKR BUN / CREAT RATIO: 12 (ref 8.0–25.0)
BKR CALCIUM: 10 mg/dL (ref 8.4–10.3)
BKR CHLORIDE: 119 mmol/L — ABNORMAL HIGH (ref 95–115)
BKR CO2: 22 mmol/L (ref 21–32)
BKR CREATININE DELTA: 0.05
BKR CREATININE: 0.83 mg/dL (ref 0.50–1.30)
BKR EGFR, CREATININE (CKD-EPI 2021): >60 mL/min/{1.73_m2} (ref >=60)
BKR GLOBULIN: 4.2 g/dL
BKR GLUCOSE: 89 mg/dL (ref 70–100)
BKR OSMOLALITY CALCULATION: 287 mosm/kg (ref 275–295)
BKR POTASSIUM: 3.6 mmol/L (ref 3.5–5.1)
BKR PROTEIN TOTAL: 6.8 g/dL (ref 6.4–8.2)
BKR SODIUM: 145 mmol/L (ref 136–145)

## 2024-01-12 LAB — C-REACTIVE PROTEIN     (CRP): BKR C-REACTIVE PROTEIN: 0.5 mg/dL (ref 0.0–1.0)

## 2024-01-12 NOTE — Progress Notes
 [facility]	Internal Medicine Progress Note  5Subjective: 24 hour events:- No acute events overnightSubjective:Patient was seen and examined at the bedside.  Review of systems unremarkable.  Patient eager to leave hospital when able to go to [place].Review of Allergy/PMHx/Meds: Allergies have been reviewed. PMHx and social history reviewed. Scheduled Meds:Current Facility-Administered Medications Medication Dose Route Frequency Provider Last Rate Last Admin  apixaban (ELIQUIS) tablet 5 mg  5 mg Oral Q12H Lenn Cal, MD   5 mg at 01/12/24 0838  cefTRIAXone (ROCEPHIN) 1 g in sodium chloride 0.9% PF 10 mL (100 mg/mL)  1 g IV Push Q24H Lenn Cal, MD   1 g at 01/11/24 2122  gabapentin (NEURONTIN) capsule 400 mg  400 mg Oral BID WC Lenn Cal, MD   400 mg at 01/12/24 0838  polyethylene glycol (MIRALAX) packet 17 g  17 g Oral Daily Aviva Signs, MD   17 g at 01/12/24 6644  rosuvastatin (CRESTOR) tablet 10 mg  10 mg Oral Nightly Aviva Signs, MD   10 mg at 01/11/24 2121  sodium chloride 0.9 % flush 3 mL  3 mL IV Push Q8H Thampy, Dan Maker, MD   3 mL at 01/12/24 0603  zinc oxide-cod liver oil (DESITIN) 40 % paste   Topical (Top) TID Aviva Signs, MD   Given at 01/12/24 [phone] Continuous Infusions:PRN Meds:acetaminophen, dextrose (GLUCOSE) oral liquid 15 g **OR** fruit juice **OR** skim milk, dextrose (GLUCOSE) oral liquid 30 g **OR** fruit juice, dextrose injection, dextrose injection, glucagon, ketotifen, melatonin, ondansetron **OR** ondansetron (ZOFRAN) IV Push, sodium chloride, traMADoLObjective: Vitals:Temp:  [97.7 ?F (36.5 ?C)-98 ?F (36.7 ?C)] 98 ?F (36.7 ?C)Pulse:  [54-79] 71Resp:  [16-20] 20BP: (98-111)/(64-68) 98/64SpO2:  [96 %-100 %] 96 %Device (Oxygen Therapy): room air I/O's:Intake/Output Summary (Last 24 hours) at 01/12/2024 1205Last data filed at 01/12/2024 0556Gross per 24 hour Intake 360 ml Output 400 ml Net -40 ml  Physical Exam:Gen:  Well appearing, in NADHEENT: Atraumatic, poor dentition, MMMCV: nl S1 and S2, RRR, no m/r/gPulm: CTAB, no wheezes, rales or ronchi. Breathing comfortably. GI: Soft, NT, ND, +BS. No guarding or rebound tenderness. Ext: No peripheral edema, WWPNeuro: A&O x 3, EOM intact, face symmetric MSK: Full ROM. Skin: No rashesLabs: Recent Labs Lab 01/19/250649 01/20/250616 01/21/250641 WBC 4.5 4.5 5.0 HGB 10.0* 10.1* 9.8* HCT 32.60* 32.00* 32.10* PLT 184 246 277  Recent Labs Lab 01/18/250644 01/19/250649 01/20/250616 NEUTROPHILS 79.9* 52.3 51.9  Recent Labs Lab 01/19/250649 01/19/251210 01/20/250616 01/20/251202 01/21/250641 NA 145  --  146*  --  145 K 4.0  --  3.5  --  3.6 CL 121*  --  120*  --  119* CO2 18*  --  20*  --  22 BUN 7*  --  7*  --  10 CREATININE 0.78  --  0.78  --  0.83 GLU 124*   < > 91   < > 89 ANIONGAP 6  --  6  --  4*  < > = values in this interval not displayed.  Recent Labs Lab 01/19/250649 01/20/250616 01/21/250641 CALCIUM 9.2 9.9 10.0  Recent Labs Lab 01/16/251529 01/18/250644 01/19/250649 01/20/250616 01/21/250641 ALT 10*   < > 24 19 14  AST 16   < > 27 15 9  ALKPHOS 84   < > 58 67 65 BILITOT 0.1   < > <0.1 <0.1 <0.1 BILIDIR <0.1  --   --   --   --   < > = values in this interval not displayed.  Recent  Labs Lab 01/16/251529 PTT 21.7* LABPROT 11.1 INR 1.08  Diagnostics/Radiology:[place] Chest w IV ContrastResult Date: 1/17/20251. Persistent moderate left hydronephrosis with nephroureteral stent in place. Mild perinephric fluid is new compared to prior exam. No focal parenchymal hypoenhancement, although pyelonephritis is difficult to exclude; correlate with urinalysis. 2. Additional stable chronic findings as above. 3. No acute findings in the chest. [facility] Radiology Notify System Classification: Urgent result. Reported and signed by:  Mirian Mo [place] Abdomen Pelvis w IV ContrastResult Date: 1/17/20251. Persistent moderate left hydronephrosis with nephroureteral stent in place. Mild perinephric fluid is new compared to prior exam. No focal parenchymal hypoenhancement, although pyelonephritis is difficult to exclude; correlate with urinalysis. 2. Additional stable chronic findings as above. 3. No acute findings in the chest. [facility] Radiology Notify System Classification: Urgent result. Reported and signed by:  Mirian Mo MRI Brain wo IV ContrastResult Date: 01/08/2024 1. Atrophy. 2. [place] prior small vessel ischemia. 3. No acute intracranial abnormality. Reported and signed by:  Carolan Clines, MD [place] Stroke Code WO IV ContrastResult Date: 1/16/2025No acute intracranial abnormality. Chronic and incidental findings as described above. If there is clinical concern for acute ischemic stroke, consider further assessment with MRI. Reported and signed by:  Bonne Dolores, MD  Micro:Urine Cx: Proteus, resistant to Ciprofloxacin and NitrofurantoinECG/Tele Events: NoneAssessment & Plan: Consults: Urology, Neurology67 y.o. female with PMHx of multiple DVTs, RA, perforated diverticulitis, s/p hemicolectomy with ileostomy (2023), severe malnutrition, s/p PEG tube (removed), mild cognitive impairment, wheel chair bound, renal stones, ureteral stents, hx of ESBL, recent admission for sepsis due to acute pyelonephritis, s/p Left ureteral stent replacement,s tone extraction (Dr.Stroumbakis), presented with altered mental status. Neurology Dr.Shapiro consulted for possible stroke.[place] head, MRI brain negative. CXR showed possible infiltrate.UA showed 33 WBC.CRP, procalcitonin were rising. Patient was initially started on Ceftriaxone and azithromycin, then switched to Vancomycin and Zosyn for possible HCAP.MRSA negative. [place] chest negative for pneumonia.Vancomycin was stopped. Mental status fluctuated from lethargy and full confusion to normal.Developed new fever.Blood Cx were sent, NGTD.[place] abdomen pelvis showed new Left perinephric fluid, persistent left hydronephrosis with stent in place.Zosyn switched to Ertapenem given hx of ESBL.Urine cx showed proteus, sensitive to ceftriaxone, switched back to ceftriaxone. Urology Dr.Bednarz consulted. Plan for total 14 days of a/b and f/u with Dr.Stroumbakis as outpatient on 02/02/2024 as scheduled, unless patient's condition worsens. On [facility] med rec, no Eliquis was listed. Called Dr.Walsh to clarify if Eliquis was stopped intentionally at [facility]. Dr.Walsh was going to check records and call back (awaiting a call). No reports of recent bleeding. Patient repors multiple DVTs in a past, thinks someone told her at some point she may stop Eliquis (she is forgetful at times), but last DVT was seen on 05/24/2023. She was admitted in 09/2023 for an acute pyelonephritis, was discharged on Elqiuis. Eliquis was re-started. I have reviewed the patient's problem list and treatment plan and updated them as needed. Plan# Altered mental status - resolved #Acute Metabolic encephalopathy- appreciate neurology consult-  head and MRI brain negative- c/w a/b # Sepsis due to Left pyelonephritis# Left ureteral stent# Hx of ESBL# Renal stones- f/u blood Cx, NGTD- discussed with urology Dr.Bednarz, recommended to c/w a/b and observe- s/p ceftiaxone, azithromycin- s/p IV vancomycin and zosyn- s/p Ertapenem- urine Cx showed proteus sensitive to Ceftriaxone- c/w Ceftriaxone D6/14 of a/b, will move to cefpodoxime at d/c. - s/p IV fluids with boluses- f/u with Dr.Stroumbakis on 02/02/2024 as outpatient- appreciate Urology Dr.Bednarz consult # Hx of multiple DVTs- re-started Eliquis 5mg  BID- monitor #  Abnormal CXR- [place] chest negative- SLP cleared for soft diet with regular liquids # Peripheral neuropathy- re-start gabapentin  # HLD- c/w crestorAnticipated Disposition Date: D/C possible tomorrow if bed available at [facility]. DIET: Dental Soft DietFLUIDS: POELECTROLYTES: Replete PRNPPX: EliquisACCESS: PIVCODE STATUS: Full CodeElectronically Signed:Dr. Roylene Reason, DODate: 1/21/2025Time: 5:05 PM

## 2024-01-12 NOTE — Plan of Care
 Plan of Care Overview/ Patient Status1400HDiscussed during ID rounds , poss DC in AM .Pt is a LTC at [facility] , message was left in his VM .TC was requested to initiate auth or obtain denial.    Amy Promise Bushong CCM, ACM  RN Case Manager  [facility]  Case Management

## 2024-01-12 NOTE — Plan of Care
 Plan of Care Overview/ Patient StatusReceived patient alert and responsive in bed. Meds given per MAR. Ileostomy care done. Total care rendered. Patients needs attended, call light within reach. Patient denies pain or discomfort. No s/s of respiratory distress noted. Patient refused glucose check this morning. Problem: Adult Inpatient Plan of CareGoal: Patient-Specific Goal (Individualized)Outcome: Interventions implemented as appropriate Problem: Adult Inpatient Plan of CareGoal: Optimal Comfort and WellbeingOutcome: Interventions implemented as appropriate Problem: WoundGoal: Skin Health and IntegrityOutcome: Interventions implemented as appropriate Problem: WoundGoal: Optimal Wound HealingOutcome: Interventions implemented as appropriate Problem: PneumoniaGoal: Effective Oxygenation and VentilationOutcome: Interventions implemented as appropriate

## 2024-01-12 NOTE — Care Coordination-Inpatient
 PENDING INSURANCE AUTHORIZATION  THIS PATIENT CANNOT BE DISCHARGED UNTIL DETERMINATION OBTAINED   Secured facility: [facility]  Auth has been initiated with this patient's insurer.  Clinical information submitted to this patient's insurer for review today. We now await the authorization that will allow this patient to transfer to the facility.    Insurance Contact:  Eber Jones at [employer]:  [phone] Fax:  [phone] Case No.  [id number] [facility]

## 2024-01-12 NOTE — Plan of Care
 Plan of Care Overview/ Patient StatusNutrition Evaluation:  Chart reviewed for length of stay. Pt admitted with AMS r/t sepsis 2.2 L pyelonephritis. Now resolved. Met with pt who reports she was previously at rehab center, did not enjoy the food there, but has been eating to maintain/gain some weight. Pt reports her ileostomy is functioning well, no GI or other complaints this am. Missing some teeth, however reports no difficulty chewing and would like a regular diet. Current Diet Order: Diet Modified Regular, softCurrent PO Intake: PO intake since admission has been adequate at 75-100% meals per pt, observation and nursing documentation.Skin: No pressure injury as per nursing documentation. Weight history: no wt lossBased on Malnutrition Screening Tool score of 0, patient is not at risk:  Eating well with little or no weight loss.Have you lost weight recently without trying?: No Have you been eating poorly because of a decreased appetite?: NoTotal Score: 0RD following per [facility] Clinical Nutrition Standards of Care.  RD is available by consult  - please consult Nutrition Services in EPIC Electronically Signed by Albertina Parr, RD, January 12, 2024

## 2024-01-12 NOTE — Plan of Care
 Problem: Comorbidity ManagementGoal: Blood Glucose Level Within Target RangeOutcome: Interventions implemented as appropriate Problem: Adult Inpatient Plan of CareGoal: Plan of Care ReviewOutcome: Interventions implemented as appropriateGoal: Patient-Specific Goal (Individualized)Outcome: Interventions implemented as appropriateGoal: Absence of Hospital-Acquired Illness or InjuryOutcome: Interventions implemented as appropriateGoal: Optimal Comfort and WellbeingOutcome: Interventions implemented as appropriateGoal: Readiness for Transition of CareOutcome: Interventions implemented as appropriate Problem: WoundGoal: Optimal CopingOutcome: Interventions implemented as appropriateGoal: Optimal Functional AbilityOutcome: Interventions implemented as appropriateGoal: Absence of Infection Signs and SymptomsOutcome: Interventions implemented as appropriateGoal: Improved Oral IntakeOutcome: Interventions implemented as appropriateGoal: Optimal Pain Control and FunctionOutcome: Interventions implemented as appropriateGoal: Skin Health and IntegrityOutcome: Interventions implemented as appropriateGoal: Optimal Wound HealingOutcome: Interventions implemented as appropriate Problem: Skin Injury Risk IncreasedGoal: Skin Health and IntegrityOutcome: Interventions implemented as appropriate Problem: Fall Injury RiskGoal: Absence of Fall and Fall-Related InjuryOutcome: Interventions implemented as appropriate Problem: Violence Risk or ActualGoal: Anger and Impulse ControlOutcome: Interventions implemented as appropriate Problem: PneumoniaGoal: Fluid BalanceOutcome: Interventions implemented as appropriateGoal: Absence of Infection Signs and SymptomsOutcome: Interventions implemented as appropriateGoal: Effective Oxygenation and VentilationOutcome: Interventions implemented as appropriate Problem: InfectionGoal: Absence of Infection Signs and SymptomsOutcome: Interventions implemented as appropriate Plan of Care Overview/ Patient StatusAssumed care of pt 7:00, AOx3, VSS on room air, denies painMixed continence purewick in place, RLQ illeostomy intact/patentBedbound, total care given, able to make needs knownCall bell answered in person, safety maintained, call bell in reach

## 2024-01-13 LAB — COMPREHENSIVE METABOLIC PANEL
BKR A/G RATIO: 0.6
BKR ALANINE AMINOTRANSFERASE (ALT): 12 U/L (ref 12–78)
BKR ALBUMIN: 2.6 g/dL — ABNORMAL LOW (ref 3.4–5.0)
BKR ALKALINE PHOSPHATASE: 73 U/L (ref 20–120)
BKR ANION GAP: 7 (ref 5–18)
BKR ASPARTATE AMINOTRANSFERASE (AST): 8 U/L (ref 5–37)
BKR AST/ALT RATIO: 0.7
BKR BILIRUBIN TOTAL: <0.1 mg/dL (ref 0.0–1.0)
BKR BLOOD UREA NITROGEN: 13 mg/dL (ref 8–25)
BKR BUN / CREAT RATIO: 14.4 (ref 8.0–25.0)
BKR CALCIUM: 9.7 mg/dL (ref 8.4–10.3)
BKR CHLORIDE: 114 mmol/L (ref 95–115)
BKR CO2: 21 mmol/L (ref 21–32)
BKR CREATININE DELTA: 0.07
BKR CREATININE: 0.9 mg/dL (ref 0.50–1.30)
BKR EGFR, CREATININE (CKD-EPI 2021): >60 mL/min/{1.73_m2} (ref >=60)
BKR GLOBULIN: 4.6 g/dL
BKR GLUCOSE: 111 mg/dL — ABNORMAL HIGH (ref 70–100)
BKR OSMOLALITY CALCULATION: 284 mosm/kg (ref 275–295)
BKR POTASSIUM: 3.6 mmol/L (ref 3.5–5.1)
BKR PROTEIN TOTAL: 7.2 g/dL (ref 6.4–8.2)
BKR SODIUM: 142 mmol/L (ref 136–145)

## 2024-01-13 LAB — CBC WITH AUTO DIFFERENTIAL
BKR WAM ABSOLUTE IMMATURE GRANULOCYTES.: 0.01 x 1000/ÂµL (ref 0.00–0.30)
BKR WAM ABSOLUTE LYMPHOCYTE COUNT.: 1.79 x 1000/ÂµL (ref 0.60–3.70)
BKR WAM ABSOLUTE NRBC (2 DEC): 0 x 1000/ÂµL (ref 0.00–1.00)
BKR WAM ANC (ABSOLUTE NEUTROPHIL COUNT): 2.98 x 1000/ÂµL (ref 2.00–7.60)
BKR WAM BASOPHIL ABSOLUTE COUNT.: 0.04 x 1000/ÂµL (ref 0.00–1.00)
BKR WAM BASOPHILS: 0.7 % (ref 0.0–1.4)
BKR WAM EOSINOPHIL ABSOLUTE COUNT.: 0.32 x 1000/ÂµL (ref 0.00–1.00)
BKR WAM EOSINOPHILS: 5.7 % — ABNORMAL HIGH (ref 0.0–5.0)
BKR WAM HEMATOCRIT (2 DEC): 35.1 % (ref 35.00–45.00)
BKR WAM HEMOGLOBIN: 10.5 g/dL — ABNORMAL LOW (ref 11.7–15.5)
BKR WAM IMMATURE GRANULOCYTES: 0.2 % (ref 0.0–1.0)
BKR WAM LYMPHOCYTES: 31.6 % (ref 17.0–50.0)
BKR WAM MCH (PG): 27.9 pg (ref 27.0–33.0)
BKR WAM MCHC: 29.9 g/dL — ABNORMAL LOW (ref 31.0–36.0)
BKR WAM MCV: 93.1 fL (ref 80.0–100.0)
BKR WAM MONOCYTE ABSOLUTE COUNT.: 0.52 x 1000/ÂµL (ref 0.00–1.00)
BKR WAM MONOCYTES: 9.2 % (ref 4.0–12.0)
BKR WAM MPV: 9.6 fL (ref 8.0–12.0)
BKR WAM NEUTROPHILS: 52.6 % (ref 39.0–72.0)
BKR WAM NUCLEATED RED BLOOD CELLS: 0 % (ref 0.0–1.0)
BKR WAM PLATELETS: 269 x1000/ÂµL (ref 150–420)
BKR WAM RDW-CV: 14.7 % (ref 11.0–15.0)
BKR WAM RED BLOOD CELL COUNT.: 3.77 M/ÂµL — ABNORMAL LOW (ref 4.00–6.00)
BKR WAM WHITE BLOOD CELL COUNT: 5.7 x1000/ÂµL (ref 4.0–11.0)

## 2024-01-13 LAB — C-REACTIVE PROTEIN     (CRP): BKR C-REACTIVE PROTEIN: 0.4 mg/dL (ref 0.0–1.0)

## 2024-01-13 NOTE — Progress Notes
 [facility]	Internal Medicine Progress Note  6Subjective: 24 hour events:- No acute events overnightSubjective:Patient was seen and examined at the bedside.  Review of systems unremarkable.  Patient eager to leave hospital when able to go to [place]. Awaiting insurance authorization.Review of Allergy/PMHx/Meds: Allergies have been reviewed. PMHx and social history reviewed. Scheduled Meds:Current Facility-Administered Medications Medication Dose Route Frequency Provider Last Rate Last Admin  apixaban (ELIQUIS) tablet 5 mg  5 mg Oral Q12H Lenn Cal, MD   5 mg at 01/13/24 0825  cefTRIAXone (ROCEPHIN) 1 g in sodium chloride 0.9% PF 10 mL (100 mg/mL)  1 g IV Push Q24H Lenn Cal, MD   1 g at 01/12/24 2100  gabapentin (NEURONTIN) capsule 400 mg  400 mg Oral BID WC Lenn Cal, MD   400 mg at 01/13/24 0825  polyethylene glycol (MIRALAX) packet 17 g  17 g Oral Daily Aviva Signs, MD   17 g at 01/12/24 6387  rosuvastatin (CRESTOR) tablet 10 mg  10 mg Oral Nightly Thampy, Dan Maker, MD   10 mg at 01/12/24 2100  sodium chloride 0.9 % flush 3 mL  3 mL IV Push Q8H Thampy, Dan Maker, MD   3 mL at 01/12/24 2100  zinc oxide-cod liver oil (DESITIN) 40 % paste   Topical (Top) TID Aviva Signs, MD   Given by Other. at 01/13/24 1402 Continuous Infusions:PRN Meds:acetaminophen, dextrose (GLUCOSE) oral liquid 15 g **OR** fruit juice **OR** skim milk, dextrose (GLUCOSE) oral liquid 30 g **OR** fruit juice, dextrose injection, dextrose injection, glucagon, ketotifen, melatonin, ondansetron **OR** ondansetron (ZOFRAN) IV Push, sodium chloride, traMADoLObjective: Vitals:Temp:  [97.4 ?F (36.3 ?C)-98.5 ?F (36.9 ?C)] 97.6 ?F (36.4 ?C)Pulse:  [58-84] 65Resp:  [16-18] 18BP: (92-109)/(57-70) 102/63SpO2:  [96 %-99 %] 99 %Device (Oxygen Therapy): room air I/O's:Intake/Output Summary (Last 24 hours) at 01/13/2024 1622Last data filed at 01/13/2024 1400Gross per 24 hour Intake 960 ml Output 1725 ml Net -765 ml  Physical Exam:Gen:  Well appearing, in NADHEENT: Atraumatic, poor dentition, MMMCV: nl S1 and S2, RRR, no m/r/gPulm: CTAB, no wheezes, rales or ronchi. Breathing comfortably. GI: Soft, NT, ND, +BS. No guarding or rebound tenderness.  Ostomy in placeExt: No peripheral edema, WWPNeuro: A&O x 3, EOM intact, face symmetric MSK: Full ROM. Skin: No rashesLabs: Recent Labs Lab 01/20/250616 01/21/250641 01/22/250640 WBC 4.5 5.0 5.7 HGB 10.1* 9.8* 10.5* HCT 32.00* 32.10* 35.10 PLT 246 277 269  Recent Labs Lab 01/19/250649 01/20/250616 01/22/250640 NEUTROPHILS 52.3 51.9 52.6  Recent Labs Lab 01/20/250616 01/20/251202 01/21/250641 01/21/251216 01/22/250640 NA 146*  --  145  --  142 K 3.5  --  3.6  --  3.6 CL 120*  --  119*  --  114 CO2 20*  --  22  --  21 BUN 7*  --  10  --  13 CREATININE 0.78  --  0.83  --  0.90 GLU 91   < > 89   < > 111* ANIONGAP 6  --  4*  --  7  < > = values in this interval not displayed.  Recent Labs Lab 01/20/250616 01/21/250641 01/22/250640 CALCIUM 9.9 10.0 9.7  Recent Labs Lab 01/16/251529 01/18/250644 01/20/250616 01/21/250641 01/22/250640 ALT 10*   < > 19 14 12  AST 16   < > 15 9 8  ALKPHOS 84   < > 67 65 73 BILITOT 0.1   < > <0.1 <0.1 <0.1 BILIDIR <0.1  --   --   --   --   < > =  values in this interval not displayed.  Recent Labs Lab 01/16/251529 PTT 21.7* LABPROT 11.1 INR 1.08  Diagnostics/Radiology:[place] Chest w IV ContrastResult Date: 1/17/20251. Persistent moderate left hydronephrosis with nephroureteral stent in place. Mild perinephric fluid is new compared to prior exam. No focal parenchymal hypoenhancement, although pyelonephritis is difficult to exclude; correlate with urinalysis. 2. Additional stable chronic findings as above. 3. No acute findings in the chest. [facility] Radiology Notify System Classification: Urgent result. Reported and signed by:  Mirian Mo [place] Abdomen Pelvis w IV ContrastResult Date: 1/17/20251. Persistent moderate left hydronephrosis with nephroureteral stent in place. Mild perinephric fluid is new compared to prior exam. No focal parenchymal hypoenhancement, although pyelonephritis is difficult to exclude; correlate with urinalysis. 2. Additional stable chronic findings as above. 3. No acute findings in the chest. [facility] Radiology Notify System Classification: Urgent result. Reported and signed by:  Mirian Mo MRI Brain wo IV ContrastResult Date: 01/08/2024 1. Atrophy. 2. [place]ela prior small vessel ischemia. 3. No acute intracranial abnormality. Reported and signed by:  Carolan Clines, MD [place] Head Stroke Code WO IV ContrastResult Date: 1/16/2025No acute intracranial abnormality. Chronic and incidental findings as described above. If there is clinical concern for acute ischemic stroke, consider further assessment with MRI. Reported and signed by:  Bonne Dolores, MD  Micro:Urine Cx: Proteus, resistant to Ciprofloxacin and NitrofurantoinECG/Tele Events: NoneAssessment & Plan: Consults: Urology, Neurology67 y.o. female with PMHx of multiple DVTs, RA, perforated diverticulitis, s/p hemicolectomy with ileostomy (2023), severe malnutrition, s/p PEG tube (removed), mild cognitive impairment, wheel chair bound, renal stones, ureteral stents, hx of ESBL, recent admission for sepsis due to acute pyelonephritis, s/p Left ureteral stent replacement,s tone extraction (Dr.Stroumbakis), presented with altered mental status. Neurology Dr.Shapiro consulted for possible stroke.[place] head, MRI brain negative. CXR showed possible infiltrate.UA showed 33 WBC.CRP, procalcitonin were rising. Patient was initially started on Ceftriaxone and azithromycin, then switched to Vancomycin and Zosyn for possible HCAP.MRSA negative. [place] chest negative for pneumonia.Vancomycin was stopped. Mental status fluctuated from lethargy and full confusion to normal.Developed new fever.Blood Cx were sent, NGTD.[place] abdomen pelvis showed new Left perinephric fluid, persistent left hydronephrosis with stent in place.Zosyn switched to Ertapenem given hx of ESBL.Urine cx showed proteus, sensitive to ceftriaxone, switched back to ceftriaxone. Urology Dr.Bednarz consulted. Plan for total 14 days of a/b and f/u with Dr.Stroumbakis as outpatient on 02/02/2024 as scheduled, unless patient's condition worsens. On [facility] med rec, no Eliquis was listed. Called Dr.Walsh to clarify if Eliquis was stopped intentionally at [facility]. Dr.Walsh was going to check records and call back (awaiting a call). No reports of recent bleeding. Patient repors multiple DVTs in a past, thinks someone told her at some point she may stop Eliquis (she is forgetful at times), but last DVT was seen on 05/24/2023. She was admitted in 09/2023 for an acute pyelonephritis, was discharged on Elqiuis. Eliquis was re-started. I have reviewed the patient's problem list and treatment plan and updated them as needed. Plan# Altered mental status - resolved #Acute Metabolic encephalopathy- appreciate neurology consult-  head and MRI brain negative- c/w a/b # Sepsis due to Left pyelonephritis# Left ureteral stent# Hx of ESBL# Renal stones- f/u blood Cx, NGTD- discussed with urology Dr.Bednarz, recommended to c/w a/b and observe- s/p ceftiaxone, azithromycin- s/p IV vancomycin and zosyn- s/p Ertapenem- urine Cx showed proteus sensitive to Ceftriaxone- c/w Ceftriaxone D7/14 of a/b, will move to cefpodoxime at d/c. - s/p IV fluids with boluses- f/u with Dr.Stroumbakis on 02/02/2024 as outpatient- appreciate Urology Dr.Bednarz consult # Hx of  multiple DVTs- re-started Eliquis 5mg  BID- monitor # Abnormal CXR- [place] chest negative- SLP cleared for soft diet with regular liquids# Presence of Ostomy- Will provide referral to Colorectal surgery as previously recommended during prior hospitalization.  Patient very interested in follow up. # Peripheral neuropathy- re-start gabapentin  # HLD- c/w crestorAnticipated Disposition Date: D/C once insurance authorization completedDIET: Dental Soft DietFLUIDS: POELECTROLYTES: Replete PRNPPX: EliquisACCESS: PIVCODE STATUS: Full CodeElectronically Signed:Dr. Roylene Reason, DODate: 1/22/2025Time: 4:22 PM

## 2024-01-13 NOTE — Plan of Care
 [facility] NoteAssessment:  Religion:BaptistPatient's religion is listed as Control and instrumentation engineer in [employer]. I attempted to visit her this morning after visiting a patient in the next room but nursing staff was with her providing care. I prayed for her outside her room. I was informed by staff that she may possibly be discharged today. She is a resident at [facility]. Nursing staff is aware they can request chaplain visits if requested by the patient prior to discharge from the hospital.Intervention:  Referral Source: Chaplain InitiatedResponding Chaplain: Unit ChaplainConsulted With: NurseVisit and Intervention Type: Spiritual Visit Spiritual care interventions provided: Cultural, Religious or Spiritual Resources Interventions: : PrayerRelational or Interpersonal Interventions: : Spiritual Support/Presence Outcome: With the help of the chaplain, patient/loved one(s): Relational or Interpersonal Outcomes:: Established a Relationship with the Chaplain Plan:  Please see my note above. Follow-up if requested.01/13/2024 9:36 AM Plan of Care Overview/ Patient Status

## 2024-01-13 NOTE — Plan of Care
 Plan of Care Overview/ Patient StatusAssumed care of patient at 0700. Patient received A+OX3-4, VSS on RA. Bedbound, total care rendered. Incont of bladder, purewick in place hooked up to continuous suction. Ileostomy in place, C/D/I, collecting brown drainage. Denies N/V/D, pain, or SOB. IV site C/D/I, flushed without difficulty. All due medications given per [facility]. Bed locked in lowest position with alarm on. Call bell within reach. Patient educated on fall risks. All other safety interventions maintained. Plan of care ongoing.Problem: Comorbidity ManagementGoal: Blood Glucose Level Within Target RangeOutcome: Interventions implemented as appropriate Problem: Adult Inpatient Plan of CareGoal: Plan of Care ReviewOutcome: Interventions implemented as appropriateGoal: Patient-Specific Goal (Individualized)Outcome: Interventions implemented as appropriateGoal: Absence of Hospital-Acquired Illness or InjuryOutcome: Interventions implemented as appropriateGoal: Optimal Comfort and WellbeingOutcome: Interventions implemented as appropriateGoal: Readiness for Transition of CareOutcome: Interventions implemented as appropriate Problem: WoundGoal: Optimal CopingOutcome: Interventions implemented as appropriateGoal: Optimal Functional AbilityOutcome: Interventions implemented as appropriateGoal: Absence of Infection Signs and SymptomsOutcome: Interventions implemented as appropriateGoal: Improved Oral IntakeOutcome: Interventions implemented as appropriateGoal: Optimal Pain Control and FunctionOutcome: Interventions implemented as appropriateGoal: Skin Health and IntegrityOutcome: Interventions implemented as appropriateGoal: Optimal Wound HealingOutcome: Interventions implemented as appropriate

## 2024-01-13 NOTE — Plan of Care
 Plan of Care Overview/ Patient Status1900-0700Assume patient care, patient lying in bed with no distress noted. Patient is alert and orientedx3-4, VSS on RA. Medications given per MAR. Patient refused Q6FS ,patient educated on importance of checking blood glucose, patient decline, Hospitalist made aware. Ileostomy in place, CDI, stool output. Total care, purewick in place draining yellow urine.Safety precautions maintain, call bell in reach, bed alarm on, and care ongoing.

## 2024-01-14 LAB — CBC WITH AUTO DIFFERENTIAL
BKR WAM ABSOLUTE IMMATURE GRANULOCYTES.: 0.02 x 1000/ÂµL (ref 0.00–0.30)
BKR WAM ABSOLUTE LYMPHOCYTE COUNT.: 1.67 x 1000/ÂµL (ref 0.60–3.70)
BKR WAM ABSOLUTE NRBC (2 DEC): 0 x 1000/ÂµL (ref 0.00–1.00)
BKR WAM ANC (ABSOLUTE NEUTROPHIL COUNT): 3.32 x 1000/ÂµL (ref 2.00–7.60)
BKR WAM BASOPHIL ABSOLUTE COUNT.: 0.03 x 1000/ÂµL (ref 0.00–1.00)
BKR WAM BASOPHILS: 0.5 % (ref 0.0–1.4)
BKR WAM EOSINOPHIL ABSOLUTE COUNT.: 0.39 x 1000/ÂµL (ref 0.00–1.00)
BKR WAM EOSINOPHILS: 6.4 % — ABNORMAL HIGH (ref 0.0–5.0)
BKR WAM HEMATOCRIT (2 DEC): 30.8 % — ABNORMAL LOW (ref 35.00–45.00)
BKR WAM HEMOGLOBIN: 9.6 g/dL — ABNORMAL LOW (ref 11.7–15.5)
BKR WAM IMMATURE GRANULOCYTES: 0.3 % (ref 0.0–1.0)
BKR WAM LYMPHOCYTES: 27.5 % (ref 17.0–50.0)
BKR WAM MCH (PG): 27.7 pg (ref 27.0–33.0)
BKR WAM MCHC: 31.2 g/dL (ref 31.0–36.0)
BKR WAM MCV: 88.8 fL (ref 80.0–100.0)
BKR WAM MONOCYTE ABSOLUTE COUNT.: 0.64 x 1000/ÂµL (ref 0.00–1.00)
BKR WAM MONOCYTES: 10.5 % (ref 4.0–12.0)
BKR WAM MPV: 9.5 fL (ref 8.0–12.0)
BKR WAM NEUTROPHILS: 54.8 % (ref 39.0–72.0)
BKR WAM NUCLEATED RED BLOOD CELLS: 0 % (ref 0.0–1.0)
BKR WAM PLATELETS: 289 x1000/ÂµL (ref 150–420)
BKR WAM RDW-CV: 14.3 % (ref 11.0–15.0)
BKR WAM RED BLOOD CELL COUNT.: 3.47 M/ÂµL — ABNORMAL LOW (ref 4.00–6.00)
BKR WAM WHITE BLOOD CELL COUNT: 6.1 x1000/ÂµL (ref 4.0–11.0)

## 2024-01-14 LAB — COMPREHENSIVE METABOLIC PANEL
BKR A/G RATIO: 0.6
BKR ALANINE AMINOTRANSFERASE (ALT): 13 U/L (ref 12–78)
BKR ALBUMIN: 2.7 g/dL — ABNORMAL LOW (ref 3.4–5.0)
BKR ALKALINE PHOSPHATASE: 69 U/L (ref 20–120)
BKR ANION GAP: 8 (ref 5–18)
BKR ASPARTATE AMINOTRANSFERASE (AST): 9 U/L (ref 5–37)
BKR AST/ALT RATIO: 0.7
BKR BILIRUBIN TOTAL: 0.2 mg/dL (ref 0.0–1.0)
BKR BLOOD UREA NITROGEN: 14 mg/dL (ref 8–25)
BKR BUN / CREAT RATIO: 16.7 (ref 8.0–25.0)
BKR CALCIUM: 10.3 mg/dL (ref 8.4–10.3)
BKR CHLORIDE: 113 mmol/L (ref 95–115)
BKR CO2: 22 mmol/L (ref 21–32)
BKR CREATININE DELTA: -0.06
BKR CREATININE: 0.84 mg/dL (ref 0.50–1.30)
BKR EGFR, CREATININE (CKD-EPI 2021): >60 mL/min/{1.73_m2} (ref >=60)
BKR GLOBULIN: 4.6 g/dL
BKR GLUCOSE: 83 mg/dL (ref 70–100)
BKR OSMOLALITY CALCULATION: 285 mosm/kg (ref 275–295)
BKR POTASSIUM: 3.5 mmol/L (ref 3.5–5.1)
BKR PROTEIN TOTAL: 7.3 g/dL (ref 6.4–8.2)
BKR SODIUM: 143 mmol/L (ref 136–145)

## 2024-01-14 LAB — BLOOD CULTURE   (BH GH L LMW YH)
BKR BLOOD CULTURE: NO GROWTH
BKR BLOOD CULTURE: NO GROWTH

## 2024-01-14 LAB — C-REACTIVE PROTEIN     (CRP): BKR C-REACTIVE PROTEIN: <0.3 mg/dL (ref 0.0–1.0)

## 2024-01-14 NOTE — Plan of Care
 Adult Speech and Language PathologySwallow Progress Note1/23/2025Patient Name:  Danielle Windish MaloneMR#:  [id number] Date of Birth:  Jun 27, 1957Therapist:  Gwendalyn Ege, Sherlynn Stalls SLP IP Adult Date of Visit/Evaluation - 01/14/24 1234    Date of Visit/Evaluation  Date of Visit / Treatment 01/14/24   Visit Deferred Patient refused visit      SLP IP Adult Recommendations - 01/14/24 1234    Recommendations  SLP Therapy Frequency 2x per week      SLP IP Adult Inpatient Recommendations - 01/14/24 1234    SLP Recommendations for Inpatient Admission  Swallow Recommendations Recommend continue with dental soft diet with thin liquids, aspiration precautions & feed assist/tray set up. Further, recommend slow rate, small bites and single sips. D/w pt, RN & Dr. Dorris Carnes. Teslya via [facility]. Will continue to monitor pt.     Plan of Care Overview/ Patient StatusSwallow f/u attempted at pt bedside. Pt politely declined PO offerings as she was awaiting lunch meal. Pt denies difficulty eating/drinking and has been tolerating diet. SLP will reattempt as schedule allows.

## 2024-01-14 NOTE — Plan of Care
 Plan of Care Overview/ Patient StatusAssumed care of patient at 0700. Patient received A+OX4, VSS on RA. Bedbound, total car rendered. Cont of bladder, purewick in place hooked up to continuous suction. Ileostomy patent, draining brown stool. Ostomy bag C/D/I. Denies N/V/D, pain, or SOB. IV site C/D/I, flushed without difficulty. All due medications given per [facility]. Bed locked in lowest position with alarm on. Call bell within reach. Patient educated on fall risks. All other safety interventions maintained. Plan of care ongoing.Problem: Comorbidity ManagementGoal: Blood Glucose Level Within Target RangeOutcome: Interventions implemented as appropriate Problem: Adult Inpatient Plan of CareGoal: Plan of Care ReviewOutcome: Interventions implemented as appropriateGoal: Patient-Specific Goal (Individualized)Outcome: Interventions implemented as appropriateGoal: Absence of Hospital-Acquired Illness or InjuryOutcome: Interventions implemented as appropriateGoal: Optimal Comfort and WellbeingOutcome: Interventions implemented as appropriateGoal: Readiness for Transition of CareOutcome: Interventions implemented as appropriate Problem: WoundGoal: Optimal CopingOutcome: Interventions implemented as appropriateGoal: Optimal Functional AbilityOutcome: Interventions implemented as appropriateGoal: Absence of Infection Signs and SymptomsOutcome: Interventions implemented as appropriateGoal: Improved Oral IntakeOutcome: Interventions implemented as appropriateGoal: Optimal Pain Control and FunctionOutcome: Interventions implemented as appropriateGoal: Skin Health and IntegrityOutcome: Interventions implemented as appropriateGoal: Optimal Wound HealingOutcome: Interventions implemented as appropriate

## 2024-01-14 NOTE — Care Coordination-Inpatient
 Pt medically cleared for DC on 01/15/24 to [facility]. BA Carolyn to arrange for ambulance transportation for 11 am.Nicole from the admissions department at [facility] is aware.Sister [name] is aware and in agreement.MD and RN aware

## 2024-01-14 NOTE — Progress Notes
 [place] [name]	Internal Medicine Progress Note  7Subjective: 24 hour events:- No acute events overnightSubjective:Patient was seen and examined at the bedside.  Review of systems unremarkable.  Patient eager to leave hospital when able to go to [place]. Awaiting insurance authorization.Review of Allergy/PMHx/Meds: Allergies have been reviewed. PMHx and social history reviewed. Scheduled Meds:Current Facility-Administered Medications Medication Dose Route Frequency Provider Last Rate Last Admin  apixaban (ELIQUIS) tablet 5 mg  5 mg Oral Q12H Lenn Cal, MD   5 mg at 01/14/24 0903  cefTRIAXone (ROCEPHIN) 1 g in sodium chloride 0.9% PF 10 mL (100 mg/mL)  1 g IV Push Q24H Lenn Cal, MD   1 g at 01/13/24 2052  gabapentin (NEURONTIN) capsule 400 mg  400 mg Oral BID WC Lenn Cal, MD   400 mg at 01/14/24 0903  polyethylene glycol (MIRALAX) packet 17 g  17 g Oral Daily Aviva Signs, MD   17 g at 01/12/24 1610  rosuvastatin (CRESTOR) tablet 10 mg  10 mg Oral Nightly Aviva Signs, MD   10 mg at 01/13/24 2052  sodium chloride 0.9 % flush 3 mL  3 mL IV Push Q8H Thampy, Dan Maker, MD   3 mL at 01/12/24 2100  zinc oxide-cod liver oil (DESITIN) 40 % paste   Topical (Top) TID Aviva Signs, MD   Given at 01/14/24 [phone] Continuous Infusions:PRN Meds:acetaminophen, dextrose (GLUCOSE) oral liquid 15 g **OR** fruit juice **OR** skim milk, dextrose (GLUCOSE) oral liquid 30 g **OR** fruit juice, dextrose injection, dextrose injection, glucagon, ketotifen, melatonin, ondansetron **OR** ondansetron (ZOFRAN) IV Push, sodium chloride, traMADoLObjective: Vitals:Temp:  [97.2 ?F (36.2 ?C)-97.6 ?F (36.4 ?C)] 97.5 ?F (36.4 ?C)Pulse:  [54-73] 54Resp:  [16-20] 16BP: (95-108)/(60-70) 95/60SpO2:  [97 %-99 %] 97 %Device (Oxygen Therapy): room air I/O's:Intake/Output Summary (Last 24 hours) at 01/14/2024 1239Last data filed at 01/14/2024 0912Gross per 24 hour Intake 1440 ml Output 1150 ml Net 290 ml  Physical Exam:Gen:  Well appearing, in NADHEENT: Atraumatic, poor dentition, MMMCV: nl S1 and S2, RRR, no m/r/gPulm: CTAB, no wheezes, rales or ronchi. Breathing comfortably. GI: Soft, NT, ND, +BS. No guarding or rebound tenderness.  Ostomy in placeExt: No peripheral edema, WWPNeuro: A&O x 3, EOM intact, face symmetric MSK: Full ROM. Skin: No rashesLabs: Recent Labs Lab 01/21/250641 01/22/250640 01/23/250639 WBC 5.0 5.7 6.1 HGB 9.8* 10.5* 9.6* HCT 32.10* 35.10 30.80* PLT 277 269 289  Recent Labs Lab 01/20/250616 01/22/250640 01/23/250639 NEUTROPHILS 51.9 52.6 54.8  Recent Labs Lab 01/21/250641 01/21/251216 01/22/250640 01/23/250639 NA 145  --  142 143 K 3.6  --  3.6 3.5 CL 119*  --  114 113 CO2 22  --  21 22 BUN 10  --  13 14 CREATININE 0.83  --  0.90 0.84 GLU 89   < > 111* 83 ANIONGAP 4*  --  7 8  < > = values in this interval not displayed.  Recent Labs Lab 01/21/250641 01/22/250640 01/23/250639 CALCIUM 10.0 9.7 10.3  Recent Labs Lab 01/16/251529 01/18/250644 01/21/250641 01/22/250640 01/23/250639 ALT 10*   < > 14 12 13  AST 16   < > 9 8 9  ALKPHOS 84   < > 65 73 69 BILITOT 0.1   < > <0.1 <0.1 0.2 BILIDIR <0.1  --   --   --   --   < > = values in this interval not displayed.  Recent Labs Lab 01/16/251529 PTT 21.7* LABPROT 11.1 INR 1.08  Diagnostics/Radiology:[place] Chest w IV ContrastResult Date: 1/17/20251. Persistent moderate left  hydronephrosis with nephroureteral stent in place. Mild perinephric fluid is new compared to prior exam. No focal parenchymal hypoenhancement, although pyelonephritis is difficult to exclude; correlate with urinalysis. 2. Additional stable chronic findings as above. 3. No acute findings in the chest. [facility] Radiology Notify System Classification: Urgent result. Reported and signed by:  Mirian Mo Henderson Abdomen Pelvis w IV ContrastResult Date: 1/17/20251. Persistent moderate left hydronephrosis with nephroureteral stent in place. Mild perinephric fluid is new compared to prior exam. No focal parenchymal hypoenhancement, although pyelonephritis is difficult to exclude; correlate with urinalysis. 2. Additional stable chronic findings as above. 3. No acute findings in the chest. [facility] Radiology Notify System Classification: Urgent result. Reported and signed by:  Mirian Mo MRI Brain wo IV ContrastResult Date: 01/08/2024 1. Atrophy. 2. Sequela prior small vessel ischemia. 3. No acute intracranial abnormality. Reported and signed by:  Carolan Clines, MD [place] Stroke Code WO IV ContrastResult Date: 1/16/2025No acute intracranial abnormality. Chronic and incidental findings as described above. If there is clinical concern for acute ischemic stroke, consider further assessment with MRI. Reported and signed by:  Bonne Dolores, MD  Micro:Urine Cx: Proteus, resistant to Ciprofloxacin and NitrofurantoinECG/Tele Events: NoneAssessment & Plan: Consults: Urology, Neurology67 y.o. female with PMHx of multiple DVTs, RA, perforated diverticulitis, s/p hemicolectomy with ileostomy (2023), severe malnutrition, s/p PEG tube (removed), mild cognitive impairment, wheel chair bound, renal stones, ureteral stents, hx of ESBL, recent admission for sepsis due to acute pyelonephritis, s/p Left ureteral stent replacement,s tone extraction (Dr.Stroumbakis), presented with altered mental status. Neurology Dr.Shapiro consulted for possible stroke.[place] head, MRI brain negative. CXR showed possible infiltrate.UA showed 33 WBC.CRP, procalcitonin were rising. Patient was initially started on Ceftriaxone and azithromycin, then switched to Vancomycin and Zosyn for possible HCAP.MRSA negative. [place] chest negative for pneumonia.Vancomycin was stopped. Mental status fluctuated from lethargy and full confusion to normal.Developed new fever.Blood Cx were sent, NGTD.[place] abdomen pelvis showed new Left perinephric fluid, persistent left hydronephrosis with stent in place.Zosyn switched to Ertapenem given hx of ESBL.Urine cx showed proteus, sensitive to ceftriaxone, switched back to ceftriaxone. Urology Dr.Bednarz consulted. Plan for total 14 days of a/b and f/u with Dr.Stroumbakis as outpatient on 02/02/2024 as scheduled, unless patient's condition worsens. On [facility] med rec, no Eliquis was listed. Called Dr.Walsh to clarify if Eliquis was stopped intentionally at [facility]. Dr.Walsh was going to check records and call back (awaiting a call). No reports of recent bleeding. Patient repors multiple DVTs in a past, thinks someone told her at some point she may stop Eliquis (she is forgetful at times), but last DVT was seen on 05/24/2023. She was admitted in 09/2023 for an acute pyelonephritis, was discharged on Elqiuis. Eliquis was re-started. I have reviewed the patient's problem list and treatment plan and updated them as needed. Plan# Altered mental status - resolved #Acute Metabolic encephalopathy- appreciate neurology consult- [place] head and MRI brain negative- c/w a/b # Sepsis due to Left pyelonephritis# Left ureteral stent# Hx of ESBL# Renal stones- f/u blood Cx, NGTD- discussed with urology Dr.Bednarz, recommended to c/w a/b and observe- s/p ceftiaxone, azithromycin- s/p IV vancomycin and zosyn- s/p Ertapenem- urine Cx showed proteus sensitive to Ceftriaxone- c/w Ceftriaxone D8/14 of a/b, will move to cefpodoxime at d/c. - s/p IV fluids with boluses- f/u with Dr.Stroumbakis on 02/02/2024 as outpatient- appreciate Urology Dr.Bednarz consult # Hx of multiple DVTs- re-started Eliquis 5mg  BID- monitor # Abnormal CXR- [place] chest negative- SLP cleared for soft diet with regular liquids# Presence of Ostomy- Will provide referral to  Colorectal surgery as previously recommended during prior hospitalization.  Patient very interested in follow up. # Peripheral neuropathy- re-start gabapentin  # HLD- c/w crestorAnticipated Disposition Date: Plan for d/c to STR in AMDIET: Dental Soft DietFLUIDS: POELECTROLYTES: Replete PRNPPX: EliquisACCESS: PIVCODE STATUS: Full CodeElectronically Signed:Dr. Roylene Reason, DODate: 1/23/2025Time: 12:39 PM

## 2024-01-14 NOTE — Plan of Care
 Plan of Care Overview/ Patient [id number] patient care, patient lying in bed with no distress noted. Patient is alert and orientedx3-4, VSS on RA. Medications given per MAR. Ileostomy in place, CDI, stool output. Total care, purewick in place draining yellow urine. Safety precautions maintain, call bell in reach, bed alarm on, and care ongoing.

## 2024-01-14 NOTE — Care Coordination-Inpatient
 01/12/24 1353 Authorization Information Date Authorization Initiated:  01/12/24 Time Authorization Initiated: 1353 Mode Clinical was sent: Fax Facility Name [facility] Authorization no Insurance Company [employer] Insurance Co. Solicitor Name/# Eber Jones at [facility]  [id number] Facility Authorization #/Details Pending Auth#[id number], Joni Reining is aware of denial letter will be faxed on 01/15/24 to her facility Peer to Peer Requested Yes Contact Info for Peer to Peer: Name and Phone# Patient is LTC at [facility] Peer to Peer Conducted No

## 2024-01-15 MED ORDER — CEFUROXIME AXETIL 500 MG TABLET
500 | Freq: Two times a day (BID) | ORAL | Status: AC
Start: 2024-01-15 — End: ?

## 2024-01-15 MED ORDER — APIXABAN 5 MG TABLET
5 | Freq: Two times a day (BID) | ORAL | Status: AC
Start: 2024-01-15 — End: ?

## 2024-01-15 NOTE — ED Provider Notes
 [facility] Emergency Department	Chief Complaint:Chief Complaint Patient presents with  Altered Mental Status   Pt sent in by [facility]le [name] for AMS. LKW around noon per EMS. BG 109. She is alert and confused at baseline. Typically she would be able to answer direct questions (date, name, nurse name, etc) and now she can't. She is not on thinners. Has hx of DVT. No longer on eliquis.  History of Present IllnessPatient is a 68 yo female with a history of Encephalopathy, DM, GERD, Ileostomy, Incontinence, Cellulitis, paraplegia per nursing home notes, who presents with an altered mental status. She was reportedly normal at noon but later did not recognize her aid and could not provide her date of birth. She typically has a good understanding of the date and time, suggesting a significant change in her mental status.[facility]le [name] denies any pain or weakness. She was uncooperative with some parts of the neurological assessment, making it difficult to fully evaluate her condition. She has a history of polyneuropathy, which may be relevant to her current presentation.She also has a Acupuncturist, suggesting some degree of cognitive or functional impairment. She has a wound in an unspecified location, but denies any associated pain.Danielle [name]'s mobility status is unclear, as she states she does not really walk. 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 (HC Code) (HC CODE) (HC Code)   Dysphagia   Encephalopathy   ESBL (extended spectrum beta-lactamase) producing bacteria infection   GERD (gastroesophageal reflux disease)   Hypercalcemia   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 CODE) (HC Code)   Incontinent of urine   Paraplegia (HC Code)   Rheumatoid arthritis (HC Code)  Past Surgical History: Procedure Laterality Date  BAND HEMORRHOIDECTOMY    CYSTECTOMY  1972  ILEOSTOMY    LAPAROSCOPIC SALPINGOOPHERECTOMY  1986  LAPAROTOMY  2022  PARTIAL COLECTOMY  2022  PEG TUBE PLACEMENT  01/2022  UPPER GASTROINTESTINAL ENDOSCOPY   Social History:Social History Tobacco Use  Smoking status: Never  Smokeless tobacco: Never Vaping Use  Vaping status: Never Used Substance Use Topics  Alcohol use: Not Currently  Drug use: Never Family History:No family history on file.Medications: Medication List  ASK your doctor about these medications  acetaminophen 325 mg tabletCommonly known as: TYLENOL apixaban 5 mg Tab tabletCommonly known as: ELIQUIS ferrous gluconate 324 mg (38 mg iron) tabletCommonly known as: FERGONTake 1 tablet (324 mg total) by mouth every other day. fluconazole 150 mg tabletCommonly known as: DIFLUCAN folic acid 1 mg tabletCommonly known as: FOLVITETake 1 tablet (1 mg total) by mouth daily. gabapentin 400 mg capsuleCommonly known as: NEURONTIN magnesium hydroxide 400 mg/5 mL suspensionCommonly known as: MILK OF MAGNESIA melatonin 3 mg tabletTake 1 tablet (3 mg total) by mouth nightly. nitrofurantoin 50 mg capsuleCommonly known as: MACRODANTINTake 1 capsule (50 mg total) by mouth daily. pantoprazole 40 mg tabletCommonly known as: PROTONIXTake 1 tablet (40 mg total) by mouth daily. polyethylene glycol 17 gram packetCommonly known as: MIRALAXTake 1 packet (17 g total) by mouth daily. Mix in 8 ounces of water, juice, soda, coffee or tea prior to taking. potassium citrate 15 mEq extended release tabletCommonly known as: Urocit-K 15Take 1 tablet (15 mEq total) by  mouth 2 (two) times daily. rosuvastatin 10 mg tabletCommonly known as: CRESTOR sodium chloride 0.45 % solution sodium chloride 0.9% solution traMADoL 50 mg tabletCommonly known as: ULTRAM  Allergies as of 01/07/2024  (No Known Allergies) Physical Exam:  Vital signs were reviewed BP 124/74  - Pulse 88  - Temp 99.7 ?F (37.6 ?C) (Temporal)  - Resp 18  - Wt 65.6 kg  - SpO2 96%  - BMI 24.07 kg/m? General Appear:	Awake, alert, and in no distressHead:			Normocephalic, atraumaticEars/Nose/Throat:	Pharynx clear, mucous membranes moistEyes:			PERRL EOMINeck:			Supple, FROM, no adenopathy, no JVDCardiovascular:	Regular rate and rhythm.  No murmurs.Respiratory:		Clear without rales, rhonchi or wheezesAbdomen:		+ileostomy in placeMusculoskeletal:	No joint tenderness, deformity or swellingSkin:			Dry, no rashNeurologic:		Alert and oriented x1, contracted lower extremities, decreased strength bilateral upper extremities (+documentation of paraplegia on nursing home notes)Physical ExamMedical Decision Making:Nursing notes were reviewed: YesI reviewed the following historical information: medical recordsOxygen saturation interpretation: 97% on room air which is not hypoxicLaboratory studies: Results for orders placed or performed during the hospital encounter of 01/07/24 Troponin T High Sensitivity, Emergency; 0 hour baseline AND 1 hour with reflex (3 hour) Result Value Ref Range  High Sensitivity Troponin T 10 See Comment ng/L CBC auto differential Result Value Ref Range  WBC 8.4 4.0 - 11.0 x1000/?L  RBC 3.84 (L) 4.00 - 6.00 M/?L  Hemoglobin 10.7 (L) 11.7 - 15.5 g/dL  Hematocrit 82.95 (L) 62.13 - 45.00 %  MCV 89.3 80.0 - 100.0 fL  MCH 27.9 27.0 - 33.0 pg  MCHC 31.2 31.0 - 36.0 g/dL  RDW-CV 08.6 57.8 - 46.9 %  Platelets 215 150 - 420 x1000/?L  MPV 10.1 8.0 - 12.0 fL  Neutrophils 82.5 (H) 39.0 - 72.0 %  Lymphocytes 8.2 (L) 17.0 - 50.0 %  Monocytes 8.0 4.0 - 12.0 %  Eosinophils 0.5 0.0 - 5.0 %  Basophil 0.4 0.0 - 1.4 %  Immature Granulocytes 0.4 0.0 - 1.0 %  nRBC 0.0 0.0 - 1.0 %  Absolute Lymphocyte Count 0.69 0.60 - 3.70 x 1000/?L  Monocyte Absolute Count 0.67 0.00 - 1.00 x 1000/?L  Eosinophil Absolute Count 0.04 0.00 - 1.00 x 1000/?L  Basophil Absolute Count 0.03 0.00 - 1.00 x 1000/?L  Absolute Immature Granulocyte Count 0.03 0.00 - 0.30 x 1000/?L  Absolute nRBC 0.00 0.00 - 1.00 x 1000/?L  ANC (Abs Neutrophil Count) 6.96 2.00 - 7.60 x 1000/?L POC Glucose (Fingerstick) Result Value Ref Range  Glucose, Meter 90 70 - 100 mg/dL EKG Result Value Ref Range  Heart Rate 78 bpm  QRS Interval 86 ms  QT Interval 382 ms  QTC Interval 435 ms  P Axis 45 deg  QRS Axis -37 deg  T Wave Axis 49 deg  P-R Interval 162 msec  SEVERITY Abnormal ECG severity Radiological Imaging Studies:XR Chest PA or AP-portable Final Result  [place] Stroke Code WO IV Contrast Final Result No acute intracranial abnormality.  Chronic and incidental findings as described above.  If there is clinical concern for acute ischemic stroke, consider further assessment with MRI.  Reported and signed by:  Bonne Dolores, MD   EKG: 	NSRDiscussed patient with another provider: YesSummary of ED Vital Signs:Vitals:  01/07/24 1447 BP: 124/74 Pulse: 88 Resp: 18 Temp: 99.7 ?F (37.6 ?C) TempSrc: Temporal SpO2: 96% Weight: 65.6 kg Procedures:ProceduresAttestation/Critical CareAssessment & PlanAltered Mental StatusNew onset confusion reported since noon. Patient is not fully cooperative with the exam, but no clear focal neurologic deficits noted.-Order labs including CBC, CMP, and consider head  to rule out acute intracranial process.-Review medications for potential causes of  altered mental status.PolyneuropathyChronic condition, no new complaints or changes noted in the conversation.-Continue current management.Abdominal WoundNo complaints of pain or discomfort.-Ensure wound care is being provided as per current plan.Medical Decision MakingPatient seen and evaluated in Er by Dr Nile Riggs as stroke alert. States patient's stroke alert should be called off. Recommends [place]  head only after exam preformed. 1600Patient febrile to 101Ua ordered as well as viral studies.4:28 PMPatient's conservator contacted. Danielle [name]. States she and her son were visiting with patient yesterday. Conservator now complaining of diarrhea therefore unable to see patient in Er. Understands presentation and agrees w mgmt and admission. Amount and/or Complexity of Data ReviewedLabs: ordered.Radiology: ordered.ECG/medicine tests: ordered.RiskOTC drugs.______________________________________________________________________Condition: 	StableDisposition: 	AdmittedDiagnosis: 	AMS, FeverI provided a concise overview of the ambient note generation solution. Danielle [name] or their legally authorized representative verbally consented to a temporary audio recording of their visit to assist with completing the visit documentation using an AI-powered solution. This note was reviewed for accuracy by Altamese Cabal, MD who performed the clinical service. Altamese Cabal, MD01/24/25 [id number]

## 2024-01-15 NOTE — Plan of Care
 Plan of Care Overview/ Patient [id number] patient care, patient lying in bed with no distress noted. Patient is alert and orientedx3-4, VSS on RA. Medications given per MAR. Ileostomy in place, CDI, stool output. Total care, purewick in place draining yellow urine. Safety precautions maintain, call bell in reach, bed alarm on, and care ongoing.

## 2024-01-15 NOTE — Plan of Care
 Danielle [name] was discharged via stretcher/ambulance  Acknowledged and verbalized understanding of discharge instructionsand recommended follow up care as per the after visit summary.  Written discharge instructions provided. Denies any further questions. Vital signs    Vitals:  01/14/24 2041 01/15/24 0429 01/15/24 0929 01/15/24 0931 BP: 96/60 98/64 97/60  99/62 Pulse: 62 (!) 52 (!) 58 61 Resp:  16 19  Temp:  97.7 ?F (36.5 ?C) 98.1 ?F (36.7 ?C)  TempSrc:  Oral Oral  SpO2:  95% 100% 100% Weight:     Height:     Pt belongings returned, PIV to right arm removed. External Cath removed.Plan of Care Overview/ Patient StatusReceived Pt AO x3-4, with baseline forgetfulness, pleasant, calm, and cooperative, afebrile, layign on bed w head of bed slightly elevated breathing RA saturating at 99%, no cough noted, no reported SOB, no sx of respi distress. Ileostomy/colostomy bag in place w medium mushy green output, external cath in place w good urine output. PIV rt arm flushing well, no redness/swelling, no sx of infection/infiltration. Denies pain/discomfort. Refused Myralax but all other meds given as ordered tolerated w no untoward effects. Meal tray setup, noted to have appetite. Maintained fall/standard precautions. Ensured comfort and safety. Positioned call bell/belongings w/in reach.Problem: Comorbidity ManagementGoal: Blood Glucose Level Within Target Range1/24/2025 0959 by Benay Spice, RNOutcome: Outcome(s) achieved1/24/2025 0959 by Benay Spice, RNOutcome: Interventions implemented as appropriate1/24/2025 0981 by Benay Spice, RNOutcome: Outcome(s) achieved Problem: Adult Inpatient Plan of CareGoal: Plan of Care Review1/24/2025 0959 by Benay Spice, RNOutcome: Outcome(s) achieved1/24/2025 0959 by Benay Spice, RNOutcome: Interventions implemented as appropriate1/24/2025 1914 by Benay Spice, RNOutcome: Outcome(s) achievedGoal: Patient-Specific Goal (Individualized)01/15/2024 0959 by Benay Spice, RNOutcome: Outcome(s) achieved1/24/2025 0959 by Benay Spice, RNOutcome: Interventions implemented as appropriate1/24/2025 7829 by Benay Spice, RNOutcome: Outcome(s) achievedGoal: Absence of Hospital-Acquired Illness or Injury1/24/2025 0959 by Benay Spice, RNOutcome: Outcome(s) achieved1/24/2025 0959 by Benay Spice, RNOutcome: Interventions implemented as appropriate1/24/2025 5621 by Benay Spice, RNOutcome: Outcome(s) achievedGoal: Optimal Comfort and Wellbeing1/24/2025 0959 by Benay Spice, RNOutcome: Outcome(s) achieved1/24/2025 0959 by Benay Spice, RNOutcome: Interventions implemented as appropriate1/24/2025 3086 by Benay Spice, RNOutcome: Outcome(s) achievedGoal: Readiness for Transition of Care1/24/2025 0959 by Benay Spice, RNOutcome: Outcome(s) achieved1/24/2025 0959 by Benay Spice, RNOutcome: Interventions implemented as appropriate1/24/2025 5784 by Benay Spice, RNOutcome: Outcome(s) achieved Problem: WoundGoal: Optimal Coping1/24/2025 0959 by Benay Spice, RNOutcome: Outcome(s) achieved1/24/2025 0959 by Benay Spice, RNOutcome: Interventions implemented as appropriate1/24/2025 6962 by Benay Spice, RNOutcome: Outcome(s) achievedGoal: Optimal Functional Ability1/24/2025 0959 by Benay Spice, RNOutcome: Outcome(s) achieved1/24/2025 0959 by Benay Spice, RNOutcome: Interventions implemented as appropriate1/24/2025 9528 by Benay Spice, RNOutcome: Outcome(s) achievedGoal: Absence of Infection Signs and Symptoms1/24/2025 0959 by Benay Spice, RNOutcome: Outcome(s) achieved1/24/2025 0959 by Benay Spice, RNOutcome: Interventions implemented as appropriate1/24/2025 4132 by Benay Spice, RNOutcome: Outcome(s) achievedGoal: Improved Oral Intake1/24/2025 0959 by Benay Spice, RNOutcome: Outcome(s) achieved1/24/2025 0959 by Benay Spice, RNOutcome: Interventions implemented as appropriate1/24/2025 4401 by Benay Spice, RNOutcome: Outcome(s) achievedGoal: Optimal Pain Control and Function1/24/2025 0959 by Benay Spice, RNOutcome: Outcome(s) achieved1/24/2025 0959 by Benay Spice, RNOutcome: Interventions implemented as appropriate1/24/2025 0272 by Benay Spice, RNOutcome: Outcome(s) achievedGoal: Skin Health and Integrity1/24/2025 0959 by Benay Spice, RNOutcome: Outcome(s) achieved1/24/2025 0959 by Benay Spice, RNOutcome: Interventions implemented as appropriate1/24/2025 5366 by Benay Spice, RNOutcome: Outcome(s) achievedGoal: Optimal Wound Healing1/24/2025 0959 by Benay Spice, RNOutcome: Outcome(s) achieved1/24/2025 0959 by Benay Spice, RNOutcome: Interventions implemented as appropriate1/24/2025 4403 by Benay Spice, RNOutcome: Outcome(s) achieved Problem: Skin Injury Risk IncreasedGoal: Skin Health and Integrity1/24/2025 0959 by Benay Spice, RNOutcome: Outcome(s) achieved1/24/2025 0959 by Benay Spice, RNOutcome: Interventions implemented as appropriate1/24/2025  1610 by Benay Spice, RNOutcome: Outcome(s) achieved Problem: Fall Injury RiskGoal: Absence of Fall and Fall-Related Injury1/24/2025 0959 by Benay Spice, RNOutcome: Outcome(s) achieved1/24/2025 0959 by Benay Spice, RNOutcome: Interventions implemented as appropriate1/24/2025 [phone] by Benay Spice, RNOutcome: Outcome(s) achieved Problem: Violence Risk or ActualGoal: Anger and Impulse Control1/24/2025 0959 by Benay Spice, RNOutcome: Outcome(s) achieved1/24/2025 0959 by Benay Spice, RNOutcome: Interventions implemented as appropriate1/24/2025 5409 by Benay Spice, RNOutcome: Outcome(s) achieved Problem: PneumoniaGoal: Fluid Balance1/24/2025 0959 by Benay Spice, RNOutcome: Outcome(s) achieved1/24/2025 0959 by Benay Spice, RNOutcome: Interventions implemented as appropriate1/24/2025 8119 by Benay Spice, RNOutcome: Outcome(s) achievedGoal: Absence of Infection Signs and Symptoms1/24/2025 0959 by Benay Spice, RNOutcome: Outcome(s) achieved1/24/2025 0959 by Benay Spice, RNOutcome: Interventions implemented as appropriate1/24/2025 1478 by Benay Spice, RNOutcome: Outcome(s) achievedGoal: Effective Oxygenation and Ventilation1/24/2025 0959 by Benay Spice, RNOutcome: Outcome(s) achieved1/24/2025 0959 by Benay Spice, RNOutcome: Interventions implemented as appropriate1/24/2025 0958 by Benay Spice, RNOutcome: Outcome(s) achieved Problem: InfectionGoal: Absence of Infection Signs and Symptoms1/24/2025 0959 by Benay Spice, RNOutcome: Outcome(s) achieved1/24/2025 0959 by Benay Spice, RNOutcome: Interventions implemented as appropriate1/24/2025 2956 by Benay Spice, RNOutcome: Outcome(s) achieved

## 2024-01-15 NOTE — Discharge Instructions
 During this admission you were cared for by Dr Tresa Endo, Wynonia Musty, DO. You were admitted for Sepsis due to urinary tract infection (HC Code) (HC CODE) (HC Code) and received antibiotics. You are now stable for discharge from the hospital. Please do the following things:Complete 6 more days of oral antibiotics to complete a 14 day course. This starts tonight. Follow-up with your urologist on 2/11/25For management of your ostomy, I have provided a name of a local colo-rectal surgeon who may be able to assist. Recommended activity:Activity as tolerated IV Sites - notify your doctor for any extreme redness or observed drainage from any old IV site. Follow WU:[id number] [facility] [phone], Barnetta Hammersmith, MD[address] [phone] an appointment as soon as possible for a visit in 2 week(s)Post-hospitalization follow-upStroumbakis, Christella Noa [facility] [address] [place] [phone] on 2/11/2025f/u UrologyMcWhorter, Bethena Roys, [place] [address] [place] [phone] an appointment as soon as possible for a visit in 1 month(s)Follow-up ostomy In general, it is best to be seen by your primary care physician within one to two weeks from a hospital discharge.Should you have any questions regarding your hospital stay, please call [phone]. Best of luck!

## 2024-01-15 NOTE — Discharge Summary
 [facility] SummaryPatient Data:  Patient Name: Danielle [name] Admit date: 01/07/2024 Age: 68 y.o. Discharge date: 01/15/24 DOB: 28-Feb-1956	 Discharge Attending Physician: Hinton Rao DO  MRN: [id number]	 Discharged Condition: fair PCP: Manson Passey, MD  Disposition: Skilled Nursing Facility for Long Term Care Principal Diagnosis: Sepsis 2/2 to Left PyelonephritisToxic Metabolic EncephalopathyComorbidities Comorbidities present on admission:Dysphagia - Suspected oral phase dysphagia PMH of COVID Hx of Multiple DVTsPerforated Diverticulitis s/p IleostomyRAHx of ESBLSecondary diagnoses occurring during hospitalization:Hypernatremia  Post Discharge Follow Up Items: Issues to be Addressed Post Discharge:Complete 6 more days of antibiotics with CeftinFollow-up with urology, Dr. Hulen Luster, on 2/11Schedule follow-up with a colorectal surgeon for management of your ostomy. Medication changes on discharge (Full medication list at conclusion of this summary) :Current Discharge Medication List  Discontinued   nitrofurantoin (MACRODANTIN) 50 mg capsule    New  Details apixaban (ELIQUIS) 5 mg tablet Take 1 tablet (5 mg total) by mouth every 12 (twelve) hours.Start date: 01/14/2024  cefuroxime (CEFTIN) 500 mg tablet Take 1 tablet (500 mg total) by mouth 2 (two) times daily for 6 days.Start date: 01/15/2024, End date: 01/21/2024   Pending Labs and Tests: Follow-up Information:[name] [phone], Barnetta Hammersmith, MD[address] [phone] an appointment as soon as possible for a visit in 2 week(s)Post-hospitalization follow-upStroumbakis, Christella Noa [facility] [address] [place] [phone] on 2/11/2025f/u UrologyMcWhorter, Bethena Roys, [place] [address] [phone] an appointment as soon as possible for a visit in 1 month(s)Follow-up ostomy Future Appointments Date Time Provider Department Center 01/19/2024 10:40 AM [facility] TECH [facility]END [facility] DRAW 01/19/2024 11:00 AM Isaias Cowman, MD [facility] None 01/21/2024 11:30 AM [name], MD Endo None 02/02/2024 11:15 AM [name], MD [facility] [place] [facility] Course: 68 y.o. female with PMHx of multiple DVTs, RA, perforated diverticulitis, s/p hemicolectomy with ileostomy (2023), severe malnutrition, s/p PEG tube (removed), mild cognitive impairment, wheel chair bound, renal stones, ureteral stents, hx of ESBL, recent admission for sepsis due to acute pyelonephritis, s/p Left ureteral stent replacement,s tone extraction (Dr.Stroumbakis), presented with altered mental status.# Altered mental status - resolved #Acute Metabolic encephalopathyGiven confusion on admission, neurology was consulted for possible CVA. [place] head and MRI brain were negative. AMS was likely due to underlying infection.  # Sepsis due to Left pyelonephritis# Left ureteral stent# Hx of ESBL# Renal stonesCXR initially concerning for pneumonia as source of infection. [place] chest negative. Was empirically started on ceftriaxone and azithromycin then broadedn to vancomycin and zosyn. MRSA was negative. UTI on U/A. Zosyn changed to ertapenem given hx of ESBL. Blood cx were normal. [place] abdomen pelvis showed left peripheric fluid and left hydronephrosis with stent in place. Urine cx grew proteus, sensitive to ceftriaxone. Urology was consulted who recommended 14 days of a/b and f/u with Dr.Stroumbakis as outpatient on 02/02/2024 as scheduled, unless patient's condition worsens. Moved to oral cephlosporins at discharge to complete a 14 day course.  # Presence of OstomyProvided referral to Colorectal surgery as previously recommended during prior hospitalization.  Patient very interested in follow up.Hemodynamically stable for discharge to LTC at [facility] today. Inpatient Consultants and summary of recommendations:Urology 01/10/24:Impression: 68 y.o. female with PMHx of hx of DVTs (not currently on [facility]), RA, perforated diverticulitis, s/p hemicolectomy with ileostomy (2023), severe malnutrition, s/p PEG tube (removed), mild cognitive impairment, wheelchair bound, nephrolithiasis with LEFT ureteral contracture with plan for management via chronic LEFT stent exchanges with Dr. Hulen Luster, hx of ESBL,  recent admission for sepsis due to acute pyelonephritis, and currently admitted for altered mental status with pyelonephritis as suspected cause.  She was admitted to the medicine service for concern of pneumonia vs pyelonephritis as source for AMS. She is currently on broad spectrum abx. Her [place] shows a well-positioned LEFT ureteral stent with LEFT hydronephrosis that appears stable compared to September and October as well as LEFT perinephric stranding-- less likely that there is any acute obstruction. She has clinically improved with antibiotics and feels well.  Imaging discussed with patient. She feels well and wishes to forgo any procedures at this time. I discussed the risks associated with UTI and pyelonephritis. In the event she stops clinically improving would recommend earlier stent exchange patient is agreeable to this. In the meantime would recommend continuing to treat for pyelonephritis. Patient should continue plan for follow up with Dr. Hulen Luster for monitoring/exchange of the LEFT JJ stent.   Recommendations: - No acute surgical intervention at this time- Continue culture directed abx for minimum course of 14 days.- Alert GU team if clinically stops improving for consideration of earlier stent exchange.- Continue plan for follow up with Dr. Hulen Luster on 02/02/2024.Data: Pertinent lab findings:Recent Labs Lab 01/21/250641 01/22/250640 01/23/250639 WBC 5.0 5.7 6.1 HGB 9.8* 10.5* 9.6* HCT 32.10* 35.10 30.80* PLT 277 269 289  Recent Labs Lab 01/20/250616 01/22/250640 01/23/250639 NEUTROPHILS 51.9 52.6 54.8  Recent Labs Lab 01/21/250641 01/21/251216 01/22/250640 01/23/250639 NA 145  --  142 143 K 3.6  --  3.6 3.5 CL 119*  --  114 113 CO2 22  --  21 22 BUN 10  --  13 14 CREATININE 0.83  --  0.90 0.84 GLU 89   < > 111* 83 ANIONGAP 4*  --  7 8  < > = values in this interval not displayed.  Recent Labs Lab 01/21/250641 01/22/250640 01/23/250639 CALCIUM 10.0 9.7 10.3  Recent Labs Lab 01/21/250641 01/22/250640 01/23/250639 ALT 14 12 13  AST 9 8 9  ALKPHOS 65 73 69 BILITOT <0.1 <0.1 0.2  No results for input(s): PTT, LABPROT, INR in the last 168 hours. Microbiology:Recent Labs Lab 01/17/251601 LABBLOO No Growth after 5 days of incubation - No Growth after 5 days of incubation Imaging: Imaging results last 1 week:  Hurt Chest w IV ContrastResult Date: 1/17/20251. Persistent moderate left hydronephrosis with nephroureteral stent in place. Mild perinephric fluid is new compared to prior exam. No focal parenchymal hypoenhancement, although pyelonephritis is difficult to exclude; correlate with urinalysis. 2. Additional stable chronic findings as above. 3. No acute findings in the chest. [facility] Radiology Notify System Classification: Urgent result. Reported and signed by:  Mirian Mo [place] Abdomen Pelvis w IV ContrastResult Date: 1/17/20251. Persistent moderate left hydronephrosis with nephroureteral stent in place. Mild perinephric fluid is new compared to prior exam. No focal parenchymal hypoenhancement, although pyelonephritis is difficult to exclude; correlate with urinalysis. 2. Additional stable chronic findings as above. 3. No acute findings in the chest. [facility] Radiology Notify System Classification: Urgent result. Reported and signed by:  Mirian Mo MRI Brain wo IV ContrastResult Date: 01/08/2024 1. Atrophy. 2. Sequela prior small vessel ischemia. 3. No acute intracranial abnormality. Reported and signed by:  Carolan Clines, MD  Diet:  Diet CardiacOral/Enteral Rehydration Fluids (Electrolyte Drink/Water) 480 mLMobility: Highest Level of mobility - ACTUAL: Mobility Level 2, Turn self in bed/bed activity/dependent transfer, AM PAC 6-7  Physical Exam Discharge vital signs: Vitals:  01/15/24 0429 BP: 98/64 Pulse: (!) 52 Resp: 16 Temp: 97.7 ?F (36.5 ?C) Cognitive Status at  Discharge: BaselinePhysical ExamConstitutional:     General: She is not in acute distress.   Appearance: Normal appearance. She is not ill-appearing. HENT:    Head: Normocephalic.    Mouth/Throat:    Mouth: Mucous membranes are moist. Eyes:    Extraocular Movements: Extraocular movements intact. Cardiovascular:    Rate and Rhythm: Normal rate and regular rhythm.    Pulses: Normal pulses.    Heart sounds: Normal heart sounds. No murmur heard.   No friction rub. No gallop. Pulmonary:    Effort: Pulmonary effort is normal. No respiratory distress.    Breath sounds: Normal breath sounds. No stridor. No wheezing, rhonchi or rales. Abdominal:    General: Abdomen is flat. Bowel sounds are normal. There is no distension.    Palpations: Abdomen is soft.    Tenderness: There is no abdominal tenderness. There is no guarding or rebound.    Comments: Ostomy in place Musculoskeletal:    Cervical back: Normal range of motion.    Right lower leg: No edema.    Left lower leg: No edema. Skin:   General: Skin is warm and dry.    Findings: No rash. Neurological:    General: No focal deficit present.    Mental Status: She is alert. Mental status is at baseline. Psychiatric:       Mood and Affect: Mood normal.       Behavior: Behavior normal. 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 (HC Code) (HC CODE) (HC Code)   Dysphagia   Encephalopathy   ESBL (extended spectrum beta-lactamase) producing bacteria infection   GERD (gastroesophageal reflux disease)   Hypercalcemia   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 CODE) (HC Code)   Incontinent of urine   Paraplegia (HC Code)   Rheumatoid arthritis (HC Code)   Past Surgical History: Procedure Laterality Date  BAND HEMORRHOIDECTOMY    CYSTECTOMY  1972  ILEOSTOMY    LAPAROSCOPIC SALPINGOOPHERECTOMY  1986  LAPAROTOMY  2022  PARTIAL COLECTOMY  2022  PEG TUBE PLACEMENT  01/2022  UPPER GASTROINTESTINAL ENDOSCOPY    Social History Family History Social History Tobacco Use  Smoking status: Never  Smokeless tobacco: Never Substance Use Topics  Alcohol use: Not Currently  No family history on file.   Discharge Medications  Discharge: Current Discharge Medication List  START taking these medications  Details apixaban (ELIQUIS) 5 mg tablet Take 1 tablet (5 mg total) by mouth every 12 (twelve) hours.Start date: 01/14/2024  cefuroxime (CEFTIN) 500 mg tablet Take 1 tablet (500 mg total) by mouth 2 (two) times daily for 6 days.Start date: 01/15/2024, End date: 01/21/2024   CONTINUE these medications which have NOT CHANGED  Details ferrous gluconate (FERGON) 324 mg (38 mg iron) tablet Take 1 tablet (324 mg total) by mouth daily.  folic acid (FOLVITE) 1 mg tablet Take 1 tablet (1 mg total) by mouth daily.Qty: 30 tablet, Refills: 11  gabapentin (NEURONTIN) 400 mg capsule Take 1 capsule (400 mg total) by mouth 2 (two) times daily with breakfast and dinner.  magnesium hydroxide (MILK OF MAGNESIA) 400 mg/5 mL suspension Take 30 mLs by mouth daily as needed for constipation.  menthol-zinc oxide (CALMOSEPTIN) 0.44-20.6 % ointment Apply topically every 8 (eight) hours. To Perineum, labia, buttocks  potassium citrate (UROCIT-K) 15 mEq extended release tablet Take 1 tablet (15 mEq total) by mouth daily.  rosuvastatin (CRESTOR) 10 mg tablet Take 1 tablet (10 mg total) by mouth every evening.  ZINC OXIDE TOP Apply topically every 8 (eight) hours. To buttocks  acetaminophen (TYLENOL) 325 mg tablet Take 3 tablets (975 mg total) by mouth every 8 (eight) hours as needed for pain.  polyethylene glycol (MIRALAX) 17 gram packet Take 1 packet (17 g total) by mouth daily. Mix in 8 ounces of water, juice, soda, coffee or tea prior to taking.Qty: 14 each, Refills: 2  traMADoL (ULTRAM) 50 mg tablet Take 1 tablet (50 mg total) by mouth every 8 (eight) hours as needed for pain.   STOP taking these medications   nitrofurantoin (MACRODANTIN) 50 mg capsule      35 minutes spent on the discharge of this patientElectronically Signed:Daelyn Mozer Pablo Corning, DO 01/15/2024 8:46 AMBest Contact Information: [phone]

## 2024-01-18 ENCOUNTER — Other Ambulatory Visit: Admit: 2024-01-18 | Payer: BLUE CROSS/BLUE SHIELD | Attending: Nephrology | Primary: Nephrology

## 2024-01-18 DIAGNOSIS — E789 Disorder of lipoprotein metabolism, unspecified: Secondary | ICD-10-CM

## 2024-01-18 DIAGNOSIS — G629 Polyneuropathy, unspecified: Secondary | ICD-10-CM

## 2024-01-18 DIAGNOSIS — F02A Dementia in other diseases classified elsewhere, mild, without behavioral disturbance, psychotic disturbance, mood disturbance, and anxiety (HC Code): Secondary | ICD-10-CM

## 2024-01-18 LAB — CBC WITH AUTO DIFFERENTIAL
BKR WAM ABSOLUTE IMMATURE GRANULOCYTES.: 0.03 x 1000/ÂµL (ref 0.00–0.30)
BKR WAM ABSOLUTE LYMPHOCYTE COUNT.: 0.94 x 1000/ÂµL (ref 0.60–3.70)
BKR WAM ABSOLUTE NRBC (2 DEC): 0 x 1000/ÂµL (ref 0.00–1.00)
BKR WAM ANC (ABSOLUTE NEUTROPHIL COUNT): 6.52 x 1000/ÂµL (ref 2.00–7.60)
BKR WAM BASOPHIL ABSOLUTE COUNT.: 0.05 x 1000/ÂµL (ref 0.00–1.00)
BKR WAM BASOPHILS: 0.6 % (ref 0.0–1.4)
BKR WAM EOSINOPHIL ABSOLUTE COUNT.: 0.21 x 1000/ÂµL (ref 0.00–1.00)
BKR WAM EOSINOPHILS: 2.5 % (ref 0.0–5.0)
BKR WAM HEMATOCRIT (2 DEC): 33.9 % — ABNORMAL LOW (ref 35.00–45.00)
BKR WAM HEMOGLOBIN: 10.4 g/dL — ABNORMAL LOW (ref 11.7–15.5)
BKR WAM IMMATURE GRANULOCYTES: 0.4 % (ref 0.0–1.0)
BKR WAM LYMPHOCYTES: 11.1 % — ABNORMAL LOW (ref 17.0–50.0)
BKR WAM MCH (PG): 27.1 pg (ref 27.0–33.0)
BKR WAM MCHC: 30.7 g/dL — ABNORMAL LOW (ref 31.0–36.0)
BKR WAM MCV: 88.3 fL (ref 80.0–100.0)
BKR WAM MONOCYTE ABSOLUTE COUNT.: 0.71 x 1000/ÂµL (ref 0.00–1.00)
BKR WAM MONOCYTES: 8.4 % (ref 4.0–12.0)
BKR WAM MPV: 9.8 fL (ref 8.0–12.0)
BKR WAM NEUTROPHILS: 77 % — ABNORMAL HIGH (ref 39.0–72.0)
BKR WAM NUCLEATED RED BLOOD CELLS: 0 % (ref 0.0–1.0)
BKR WAM PLATELETS: 347 x1000/ÂµL (ref 150–420)
BKR WAM RDW-CV: 14.6 % (ref 11.0–15.0)
BKR WAM RED BLOOD CELL COUNT.: 3.84 M/ÂµL — ABNORMAL LOW (ref 4.00–6.00)
BKR WAM WHITE BLOOD CELL COUNT: 8.5 x1000/ÂµL (ref 4.0–11.0)

## 2024-01-18 LAB — COMPREHENSIVE METABOLIC PANEL
BKR A/G RATIO: 0.6
BKR ALANINE AMINOTRANSFERASE (ALT): 12 U/L (ref 12–78)
BKR ALBUMIN: 3.2 g/dL — ABNORMAL LOW (ref 3.4–5.0)
BKR ALKALINE PHOSPHATASE: 78 U/L (ref 20–120)
BKR ANION GAP: 5 (ref 5–18)
BKR ASPARTATE AMINOTRANSFERASE (AST): 11 U/L (ref 5–37)
BKR AST/ALT RATIO: 0.9
BKR BILIRUBIN TOTAL: 0.3 mg/dL (ref 0.0–1.0)
BKR BLOOD UREA NITROGEN: 12 mg/dL (ref 8–25)
BKR BUN / CREAT RATIO: 13.3 (ref 8.0–25.0)
BKR CALCIUM: 10.5 mg/dL — ABNORMAL HIGH (ref 8.4–10.3)
BKR CHLORIDE: 114 mmol/L (ref 95–115)
BKR CO2: 20 mmol/L — ABNORMAL LOW (ref 21–32)
BKR CREATININE DELTA: 0.06
BKR CREATININE: 0.9 mg/dL (ref 0.50–1.30)
BKR EGFR, CREATININE (CKD-EPI 2021): >60 mL/min/{1.73_m2} (ref >=60)
BKR GLOBULIN: 5.3 g/dL
BKR GLUCOSE: 123 mg/dL — ABNORMAL HIGH (ref 70–100)
BKR OSMOLALITY CALCULATION: 279 mosm/kg (ref 275–295)
BKR POTASSIUM: 3.8 mmol/L (ref 3.5–5.1)
BKR PROTEIN TOTAL: 8.5 g/dL — ABNORMAL HIGH (ref 6.4–8.2)
BKR SODIUM: 139 mmol/L (ref 136–145)

## 2024-01-19 ENCOUNTER — Encounter: Admit: 2024-01-19 | Payer: PRIVATE HEALTH INSURANCE | Attending: Medical Oncology | Primary: Nephrology

## 2024-01-19 ENCOUNTER — Inpatient Hospital Stay: Admit: 2024-01-19 | Discharge: 2024-01-19 | Payer: BLUE CROSS/BLUE SHIELD | Primary: Nephrology

## 2024-01-19 ENCOUNTER — Ambulatory Visit: Admit: 2024-01-19 | Payer: BLUE CROSS/BLUE SHIELD | Attending: Medical Oncology | Primary: Nephrology

## 2024-01-19 VITALS — BP 92/63 | HR 66 | Temp 97.90000°F | Resp 18 | Ht 65.0 in | Wt 130.0 lb

## 2024-01-19 DIAGNOSIS — D508 Other iron deficiency anemias: Secondary | ICD-10-CM

## 2024-01-19 DIAGNOSIS — I82403 Acute embolism and thrombosis of unspecified deep veins of lower extremity, bilateral: Secondary | ICD-10-CM

## 2024-01-19 DIAGNOSIS — K219 Gastro-esophageal reflux disease without esophagitis: Secondary | ICD-10-CM

## 2024-01-19 DIAGNOSIS — K5792 Diverticulitis of intestine, part unspecified, without perforation or abscess without bleeding: Secondary | ICD-10-CM

## 2024-01-19 DIAGNOSIS — D649 Anemia, unspecified: Secondary | ICD-10-CM

## 2024-01-19 DIAGNOSIS — Z7901 Long term (current) use of anticoagulants: Secondary | ICD-10-CM

## 2024-01-19 DIAGNOSIS — I82419 Acute embolism and thrombosis of unspecified femoral vein: Secondary | ICD-10-CM

## 2024-01-19 DIAGNOSIS — L03115 Cellulitis of right lower limb: Secondary | ICD-10-CM

## 2024-01-19 DIAGNOSIS — Z932 Ileostomy status: Secondary | ICD-10-CM

## 2024-01-19 DIAGNOSIS — R32 Unspecified urinary incontinence: Secondary | ICD-10-CM

## 2024-01-19 DIAGNOSIS — G9340 Encephalopathy, unspecified: Secondary | ICD-10-CM

## 2024-01-19 DIAGNOSIS — M069 Rheumatoid arthritis, unspecified: Secondary | ICD-10-CM

## 2024-01-19 DIAGNOSIS — A499 Bacterial infection, unspecified: Secondary | ICD-10-CM

## 2024-01-19 DIAGNOSIS — E099 Drug or chemical induced diabetes mellitus without complications: Secondary | ICD-10-CM

## 2024-01-19 DIAGNOSIS — R627 Adult failure to thrive: Secondary | ICD-10-CM

## 2024-01-19 DIAGNOSIS — G8220 Paraplegia, unspecified: Secondary | ICD-10-CM

## 2024-01-19 DIAGNOSIS — R1310 Dysphagia, unspecified: Secondary | ICD-10-CM

## 2024-01-19 LAB — COMPREHENSIVE METABOLIC PANEL
BKR A/G RATIO: 0.7
BKR ALANINE AMINOTRANSFERASE (ALT): 14 U/L (ref 12–78)
BKR ALBUMIN: 3.3 g/dL — ABNORMAL LOW (ref 3.4–5.0)
BKR ALKALINE PHOSPHATASE: 83 U/L (ref 20–120)
BKR ANION GAP: 6 (ref 5–18)
BKR ASPARTATE AMINOTRANSFERASE (AST): 9 U/L (ref 5–37)
BKR AST/ALT RATIO: 0.6
BKR BILIRUBIN TOTAL: 0.1 mg/dL (ref 0.0–1.0)
BKR BLOOD UREA NITROGEN: 14 mg/dL (ref 8–25)
BKR BUN / CREAT RATIO: 15.4 (ref 8.0–25.0)
BKR CALCIUM: 10.3 mg/dL (ref 8.4–10.3)
BKR CHLORIDE: 111 mmol/L (ref 95–115)
BKR CO2: 23 mmol/L (ref 21–32)
BKR CREATININE DELTA: 0.01
BKR CREATININE: 0.91 mg/dL (ref 0.50–1.30)
BKR EGFR, CREATININE (CKD-EPI 2021): >60 mL/min/{1.73_m2} (ref >=60)
BKR GLOBULIN: 4.9 g/dL
BKR GLUCOSE: 115 mg/dL — ABNORMAL HIGH (ref 70–100)
BKR OSMOLALITY CALCULATION: 281 mosm/kg (ref 275–295)
BKR POTASSIUM: 4.1 mmol/L (ref 3.5–5.1)
BKR PROTEIN TOTAL: 8.2 g/dL (ref 6.4–8.2)
BKR SODIUM: 140 mmol/L (ref 136–145)

## 2024-01-19 LAB — CBC WITH AUTO DIFFERENTIAL
BKR WAM ABSOLUTE IMMATURE GRANULOCYTES.: 0.01 x 1000/ÂµL (ref 0.00–0.30)
BKR WAM ABSOLUTE LYMPHOCYTE COUNT.: 1.33 x 1000/ÂµL (ref 0.60–3.70)
BKR WAM ABSOLUTE NRBC (2 DEC): 0 x 1000/ÂµL (ref 0.00–1.00)
BKR WAM ANC (ABSOLUTE NEUTROPHIL COUNT): 3.12 x 1000/ÂµL (ref 2.00–7.60)
BKR WAM BASOPHIL ABSOLUTE COUNT.: 0.04 x 1000/ÂµL (ref 0.00–1.00)
BKR WAM BASOPHILS: 0.8 % (ref 0.0–1.4)
BKR WAM EOSINOPHIL ABSOLUTE COUNT.: 0.24 x 1000/ÂµL (ref 0.00–1.00)
BKR WAM EOSINOPHILS: 4.6 % (ref 0.0–5.0)
BKR WAM HEMATOCRIT (2 DEC): 33.2 % — ABNORMAL LOW (ref 35.00–45.00)
BKR WAM HEMOGLOBIN: 10.5 g/dL — ABNORMAL LOW (ref 11.7–15.5)
BKR WAM IMMATURE GRANULOCYTES: 0.2 % (ref 0.0–1.0)
BKR WAM LYMPHOCYTES: 25.5 % (ref 17.0–50.0)
BKR WAM MCH (PG): 28.3 pg (ref 27.0–33.0)
BKR WAM MCHC: 31.6 g/dL (ref 31.0–36.0)
BKR WAM MCV: 89.5 fL (ref 80.0–100.0)
BKR WAM MONOCYTE ABSOLUTE COUNT.: 0.47 x 1000/ÂµL (ref 0.00–1.00)
BKR WAM MONOCYTES: 9 % (ref 4.0–12.0)
BKR WAM MPV: 9.8 fL (ref 8.0–12.0)
BKR WAM NEUTROPHILS: 59.9 % (ref 39.0–72.0)
BKR WAM NUCLEATED RED BLOOD CELLS: 0 % (ref 0.0–1.0)
BKR WAM PLATELETS: 339 x1000/ÂµL (ref 150–420)
BKR WAM RDW-CV: 14.7 % (ref 11.0–15.0)
BKR WAM RED BLOOD CELL COUNT.: 3.71 M/ÂµL — ABNORMAL LOW (ref 4.00–6.00)
BKR WAM WHITE BLOOD CELL COUNT: 5.2 x1000/ÂµL (ref 4.0–11.0)

## 2024-01-19 LAB — IRON AND TIBC
BKR IRON SATURATION (SCCCT BH GH LM): 9 % — ABNORMAL LOW (ref 11–46)
BKR IRON: 26 ug/dL — ABNORMAL LOW (ref 40–150)
BKR TOTAL IRON BINDING CAPACITY (SCCCT  BH GH LM): 291 ug/dL (ref 250–450)

## 2024-01-19 LAB — FERRITIN: BKR FERRITIN: 45 ng/mL (ref 8–252)

## 2024-01-19 LAB — D-DIMER, QUANTITATIVE: BKR D-DIMER: 0.45 mg{FEU}/L (ref <0.49)

## 2024-01-19 NOTE — Progress Notes
 Subjective:   Danielle [name] is a 68 y.o. female  HPIIn November 2022, she was admitted to a hospital in [place] with hemoperitoneum, due to a diverticular perforation.  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  She also developed encephalopathy.  She was eventually transferred to a skilled nursing facility on 02/05/2022.  Thereafter, patient was brought to [facility] on 02/11/2022 for management of encephalopathy.  She had a six-month hospitalization.  She was discharged to another skilled nursing care facility on 08/14/2022 with instructions to follow-up with a hematologist about the duration of anticoagulation therapy. She was then admitted to [facility] in March of 2024 again with altered mental status secondary to sepsis.  She was treated for UTI/sepsis and discharged back to skilled nursing care facility within a week. On 05/24/2023, she presented to [facility] with blood from the stoma.Doppler ultrasound on 05/24/2023 showed that she have a new partially occlusive thrombus in the right popliteal vein. Blood test on 05/24/2023 was consistent with iron deficiencyHemoglobin was reduced at 8.7She received 1 g of iron dextran on 06/03/2024At the time of her initial hematologic consultation on 05/26/2023, I felt that she was at an increased risk of future thrombus and I recommended that she remain on the anticoagulant therapy indefinitely - until her condition changes.  INTERIM HISTORY:Since her last visit in this clinic in June of 2024, she had 3 separate hospitalizations secondary to urosepsis.During her admission in September, she received a transfusion as wellShe was most recently hospitalized in January of 2025She remains on EliquisPAST MEDICAL HISTORY:Rheumatoid arthritisDiverticular perforation with abscess-November 2022Recurrent sepsisAnemia/iron deficiencyMEDICATIONS:Current Outpatient Medications Medication Sig  acetaminophen (TYLENOL) 325 mg tablet Take 3 tablets (975 mg total) by mouth every 8 (eight) hours as needed for pain.  apixaban (ELIQUIS) 5 mg tablet Take 1 tablet (5 mg total) by mouth every 12 (twelve) hours.  cefuroxime (CEFTIN) 500 mg tablet Take 1 tablet (500 mg total) by mouth 2 (two) times daily for 6 days.  ferrous gluconate (FERGON) 324 mg (38 mg iron) tablet Take 1 tablet (324 mg total) by mouth daily.  folic acid (FOLVITE) 1 mg tablet Take 1 tablet (1 mg total) by mouth daily.  gabapentin (NEURONTIN) 400 mg capsule Take 1 capsule (400 mg total) by mouth 2 (two) times daily with breakfast and dinner.  magnesium hydroxide (MILK OF MAGNESIA) 400 mg/5 mL suspension Take 30 mLs by mouth daily as needed for constipation.  menthol-zinc oxide (CALMOSEPTIN) 0.44-20.6 % ointment Apply topically every 8 (eight) hours. To Perineum, labia, buttocks  polyethylene glycol (MIRALAX) 17 gram packet Take 1 packet (17 g total) by mouth daily. Mix in 8 ounces of water, juice, soda, coffee or tea prior to taking.  potassium citrate (UROCIT-K) 15 mEq extended release tablet Take 1 tablet (15 mEq total) by mouth daily.  rosuvastatin (CRESTOR) 10 mg tablet Take 1 tablet (10 mg total) by mouth every evening.  traMADoL (ULTRAM) 50 mg tablet Take 1 tablet (50 mg total) by mouth every 8 (eight) hours as needed for pain.  ZINC OXIDE TOP Apply topically every 8 (eight) hours. To buttocks No current facility-administered medications for this visit. SOCIAL HISTORY:Never smokedNever consumed alcohol regularlyFAMILY HISTORY:No known family history of malignancies ALLERGIES:No Known Allergies Review of Systems  Objective:  BP 92/63 (Site: r a, Position: Sitting, Cuff Size: Medium)  - Pulse 66  - Temp 97.9 ?F (36.6 ?C) (Temporal)  - Resp 18 - Ht 5'  5 (1.651 m)  - Wt 59 kg  - SpO2 97%  - BMI 21.63 kg/m? Vitals:  01/19/24 1059 Position: Sitting Physical ExamSlight left leg edemaNo calf tenderness Latest Ref Rng 01/14/2024 01/18/2024 01/19/2024 Labs     Hemoglobin 11.7 - 15.5 g/dL 9.6 (L)  16.1 (L)  09.6 (L)  Hematocrit 35.00 - 45.00 % 30.80 (L)  33.90 (L)  33.20 (L)  WBC 4.0 - 11.0 x1000/?L 6.1  8.5  5.2  Platelets 150 - 420 x1000/?L 289  347  339   Legend:(L) Low  Assessment / Plan:  History of DVT Questionable New right popliteal DVT-05/24/2023 Anemia due to blood loss Her lower extremity exam is unchangedSince hospitalization, the hemoglobin has been stable.She has not experienced worsening anemiaShe was advised to remain on Eliquis 5 mg twice a dayShe will return to the clinic in 3 monthsPlease note: MModal speech recognition software was used to compose this note. Notes are reviewed for accuracy but unintended translational errors can occur. Please contact me if there are any questions about the content of this note.Orders Placed This Encounter Procedures  CBC without differential  Iron, TIBC and ferritin panel ([facility] LMW Q YH)  Reticulocytes  Comprehensive metabolic panel  Return Visit with Labs (peripheral draw)  Electronically Signed by Isaias Cowman, MD, January 19, 2024

## 2024-01-20 ENCOUNTER — Telehealth: Admit: 2024-01-20 | Payer: PRIVATE HEALTH INSURANCE | Attending: Medical Oncology | Primary: Nephrology

## 2024-01-20 NOTE — Telephone Encounter
 Spoke with Janelle Floor, RN at [employer], she took a verbal order for the message below and they will add Vitamin C 1000mg  a day given along with the iron tablet----- Message from Rolm Gala, MD sent at 01/20/2024 11:31 AM EST -----Recent blood test indicates that she was iron deficient.Please contact her nursing care facilityShe is currently on iron once a dayPlease have them add vitamin-C 1000 mg once a day - to be given at the same time as the iron

## 2024-02-01 ENCOUNTER — Telehealth: Admit: 2024-02-01 | Payer: PRIVATE HEALTH INSURANCE | Attending: Urology | Primary: Nephrology

## 2024-02-01 NOTE — Telephone Encounter
 Copied from CRM #[id number]. Topic: [facility] General CRM - General Inquiry>> Feb 01, 2024 10:58 AM Eula Flax wrote:[name], Danielle A called regarding- patients care taker called her name is maggi she was just speaking to a nurse regarding this patient the number to call her back on is [phone] thank you

## 2024-02-02 ENCOUNTER — Inpatient Hospital Stay: Admit: 2024-02-02 | Discharge: 2024-02-02 | Payer: BLUE CROSS/BLUE SHIELD | Primary: Nephrology

## 2024-02-02 ENCOUNTER — Ambulatory Visit: Admit: 2024-02-02 | Payer: BLUE CROSS/BLUE SHIELD | Attending: Urology | Primary: Nephrology

## 2024-02-02 ENCOUNTER — Telehealth: Admit: 2024-02-02 | Payer: PRIVATE HEALTH INSURANCE | Attending: Urology | Primary: Nephrology

## 2024-02-02 ENCOUNTER — Encounter: Admit: 2024-02-02 | Payer: BLUE CROSS/BLUE SHIELD | Attending: Urology | Primary: Nephrology

## 2024-02-02 DIAGNOSIS — N201 Calculus of ureter: Secondary | ICD-10-CM

## 2024-02-02 DIAGNOSIS — N135 Crossing vessel and stricture of ureter without hydronephrosis: Secondary | ICD-10-CM

## 2024-02-02 DIAGNOSIS — N399 Disorder of urinary system, unspecified: Secondary | ICD-10-CM

## 2024-02-02 LAB — URINALYSIS-MACROSCOPIC W/REFLEX MICROSCOPIC
BKR BILIRUBIN, UA: NEGATIVE
BKR GLUCOSE, UA: NEGATIVE
BKR KETONES, UA: NEGATIVE
BKR NITRITE, UA: POSITIVE — AB
BKR PH, UA: 6 (ref 5.5–7.5)
BKR SPECIFIC GRAVITY, UA: 1.02 (ref 1.005–1.030)
BKR UROBILINOGEN, UA (MG/DL): <2 mg/dL (ref <=2.0)

## 2024-02-02 LAB — URINE MICROSCOPIC     (BH GH LMW YH): BKR WBC/HPF: >30 /HPF — AB (ref 0–5)

## 2024-02-02 MED ORDER — FOSFOMYCIN TROMETHAMINE 3 GRAM ORAL PACKET
3 | Freq: Once | ORAL | Status: CP
Start: 2024-02-02 — End: ?
  Administered 2024-02-02: 11:00:00 3 gram via ORAL

## 2024-02-02 MED ORDER — LIDOCAINE 2 % MUCOSAL JELLY IN APPLICATOR
2 | Freq: Once | URETHRAL | Status: CP
Start: 2024-02-02 — End: ?
  Administered 2024-02-02: 11:00:00 2 mL via URETHRAL

## 2024-02-02 NOTE — Progress Notes
 Time Out DocumentationProcedure Name: CystoscopyProcedure Time: 11:18 AM Team Members Present: Antony Haste, RN, [name], MDTime Out initiated after patient positioned & prior to beginning of procedure: Danielle [name] of Patient  & MRUN # or DOB stated and matches ID band or previously confirmed med record number: YesProceduralist states or confirms procedure to be performed: YesProcedural consent is used to verify procedure performed  & matches pt identifiers:YesSite of procedure(s) (with laterality or level) is topically marked per policy & visible after draping:N/A Final check completed on all specimens: not applicableSerial number on device: [id number] Pt unable to sign e consent, paper consent signed and scanned into chart

## 2024-02-02 NOTE — Patient Instructions
 [facility] Pre-Operative InstructionsName of surgery: Cystoscopy with stent exchange LeftDate:Anticipated length of Stay:Surgeon Name:Surgical Scheduler:A surgical scheduler will contact you to discuss the surgical scheduling process. Please discuss with the surgical scheduler your availability for surgery dates. The scheduling process must consider many factors. Please know that we are working to accommodate every patient according to the urgency of the surgical case and the surgical schedule. In some cases, the schedulers may temporarily hold a scheduling date for you that you will see in MyChart. Rest assured, if that date does not work with your schedule, the surgical scheduler will work with you to arrange a time that is convenient and appropriate for your care.Surgery Time:The day before surgery, you will receive an automated text or phone call between 3:00-5:00 pm to inform you of the time to arrive for surgery check-in.If you miss this call, you can contact:For [facility] at [facility] or [place]: [phone].For [facility]: [phone].For the [facility] in [place]: [phone].For [facility]: [phone].For [facility]: [phone], or if after 5:00 pm call [phone] and ask for admitting desk. Before SurgeryYou will receive a call from a nurse on the Pre-Surgical Evaluation team to review your medical history and instructions before your surgery. Please have an updated list of your medications and allergies ready. If needed, you may also be evaluated in person by the Pre-Surgical Evaluation team. Are you sick? Check in with [place] before your surgery.If you get a cold, high fever, chills, body aches, sore throat, loss of smell, are not feeling well or become pregnant, please call your surgeon's office as soon as possible. Your surgery may need to be rescheduled.If you have been diagnosed with COVID-19 within 7 weeks of your surgery date, please let us know. Your surgeon will need to determine if it will be safe for you to undergo a surgical procedure.Please do not schedule/receive any vaccines 48 hours before or after surgery.Pre-Op Testing Requirements 1-2 Weeks Before Surgery:Blood Work and Urine Culture Vitamins: Please stop all vitamins 7 days before surgery.NSAIDS (Motrin/Ibuprofen/Advil/Daypro/Naprosyn/Feldene): please stop these 7 days before surgery.         Patients with kidney stones:You may continue NSAIDs if you are having a UreteroscopyYou must hold NSAIDs for Percutaneous Nephrolithotomy (PCNL) and Extracorporeal Shock Wave Lithotripsy (ESWL) procedures. Pain Management:If you are having pain or discomfort, you may take Acetaminophen (Tylenol).   Q. Do you take ASPIRIN?If you are taking aspirin, it is extremely important that you talk to your surgeon and your prescribing provider to see if you should hold the aspirin 7 days before surgery. Q. Do you take BLOOD THINNERS?If you are unsure, please check with your prescribing provider.If you are taking blood thinners/anticoagulants/antiplatelets, it is extremely important that you talk to your surgeon and your prescribing provider to see if you should hold those medications before surgery.Q. Do you take a GLP-1 AGONIST for weight loss or to help manage diabetes? If you are unsure, please check with your prescribing provider.For DAILY dosing (including oral tablets), do not take your GLP-1 agonist on the day of your procedure/surgery (examples include Byetta/exentatide IR; Victoza/liraglutide; Adlyxin/lixisenatide; Rybelsus/semaglutide, others).For WEEKLY dosing, hold your GLP-1 agonist do not take your GLP-1 agonist on the day of your procedure/surgery (examples include Trulicity/dulaglutide; Bydureon/exenatide; Ozempic & Wegovy/semaglutide; others)Please note: This is not a comprehensive list. New medications are being developed. Please ask if you are unsure. You will receive a call within the week prior to surgery from Pre-Surgical Evaluation, who will review the remainder  of your medications with you.Do not drink alcohol within 24 hours of surgery.Do not smoke/vape for at least 6 weeks before surgery, especially not the day of surgery. The Day Before and/or the Morning of SurgeryPreparing your bowels: (Bowel Prep)NO solid food after midnight the night before surgery  NO solid food after midnight the night before surgery/ procedure and follow specialized instructions below - (check the option that applies to you)The following conditions put you at an increased risk for aspiration. For your safety, if you have any of the following, do not drink any liquids after midnight.Take insulin for diabetes Hiatal hernia Gastroparesis Zenker's Diverticulum Achalasia Esophageal stricture Stomach or esophageal cancer Take daily narcotic pain medication  Severe heartburn or GERD (gastroesophageal reflux) that affects you more than twice a week, causes you to wake up at night with burning, food or acid regurgitation.  History of stomach or esophageal surgery (For stomach or esophageal cancer, gastric bypass, Nissen fundoplication) Daily or almost daily nausea or vomiting in the week leading up to your surgery  Patients without the above restrictions will be able to have clear liquids until two (2) hours before hospital arrival time. Acceptable clear liquids are: clear apple juice, cranberry juice, sports drinks, water, black tea/ coffee - NO milk, cream, smoothies or shakes or surgery may be cancelled or delayed. Limit caffeinated drinks to 12 ounces.You may take approved medications in the morning with a sip of water as instructed by pre admission testing.Morning of surgeryBrush teethPlease drink one 16 oz cup of apple juice or Sports Drink like Gatorade 2 hours prior to your arrival to the hospital. Outpatient Surgery (Going Home the Same Day):You should have someone at home with you for at least the first overnight after surgery.You may NOT drive yourself home after anesthesia.         A responsible adult must also accompany in the vehicle.You may not drive with 24 hours of having anesthesia or narcotic pain medication.         It is illegal to drive while under the influence of anesthesia medications or narcotic pain medications. Helpful Office & Billing Information Medical Leave of Absence Documentation for Employers:         If you need forms filled out for your employer, you can either attach them to a MyChart message or have them faxed to [phone]. Please allow 1- 2 weeks for completion, as the surgeons are not in the office every day. BillingPlease inform us immediately if you have any changes to your insurance. You can call the registration department at [phone] to make the changes. Billing for [facility], [address], [place], [place], and [facility]: Call the [employer] Office at [phone].The hours of operation are Monday - Friday 7:30am - 5:00 pm. Billing instructions for [facility] Patients): Call the [facility] Billing Team at [phone].The Hours of operation are Monday - Friday 8:30am- 5:00 pm. Cystoscopy, Care AfterRefer to this sheet in the next few weeks. These instructions provide you with information about caring for yourself after your procedure. Your health care provider may also give you more specific instructions. Your treatment has been planned according to current medical practices, but problems sometimes occur. Call your health care provider if you have any problems or questions after your procedure.What can I expect after the procedure?After the procedure, it is common to have:Mild pain when you urinate. Pain should stop within a few minutes after you urinate. This may last for up to  1 week.A small amount of blood in your urine for several days.Feeling like you need to urinate but producing only a small amount of urine.Follow these instructions at home:MedicinesTake over-the-counter and prescription medicines only as told by your health care provider.If you were prescribed an antibiotic medicine, take it as told by your health care provider. Do not stop taking the antibiotic even if you start to feel better.General instructionsReturn to your normal activities as told by your health care provider. Ask your health care provider what activities are safe for you.Do not drive for 24 hours if you received a sedative.Watch for any blood in your urine. If the amount of blood in your urine increases, call your health care provider.Follow instructions from your health care provider about eating or drinking restrictions.If a tissue sample was removed for testing (biopsy) during your procedure, it is your responsibility to get your test results. Ask your health care provider or the department performing the test when your results will be ready.Drink enough fluid to keep your urine clear or pale yellow.Keep all follow-up visits as told by your health care provider. This is important.Contact a health care provider if:You have pain that gets worse or does not get better with medicine, especially pain when you urinate.You have difficulty urinating.Get help right away if:You have more blood in your urine.You have blood clots in your urine.You have abdominal pain.You have a fever or chills.You are unable to urinate.This information is not intended to replace advice given to you by your health care provider. Make sure you discuss any questions you have with your health care provider.Document Released: 06/27/2005 Document Revised: 05/15/2016 Document Reviewed: 11/03/2016Elsevier Interactive Patient Education ? 2019 Elsevier Inc.

## 2024-02-02 NOTE — Telephone Encounter
 Copied from CRM #[id number]. Topic: Nursing Triage Note - [facility]>> Feb 02, 2024  2:25 PM Salvadore Dom wrote:.[facility] MESSAGETime of call:   2:25 PMCaller: Maggie Engineer, manufacturing systems) Caller's relationship to patient:  n/a  Calling from [employer], hospital, agency, etc.):  Germaine Pomfret   Reason for call:   Seward Grater is requesting a call back to clarify what was completed during patient appointment today and patient plan of care If not feeling well, what are symptoms:  n/a   If having symptoms, how long have the symptoms been present:  n/a   Does caller request to speak to someone urgently?  no   If yes, warm transferred to:  n/aProvider:  Dr. Pricilla Holm Last clinic visit date:  today Best telephone number for callback:   [phone] (2nd fl number to facility) Best time to return call (encourage patient to be available for callback):   any  Permission to leave message:  yes   [facility] Referral Specialist

## 2024-02-02 NOTE — Progress Notes
 [name] is a 68 y.o. female  status post bilateral stent placement for urosepsisPatient had passed a right ureteral stone has not asymptomatic left ureteral stonePatient more alert todayUrine blood-tinged07/07/2023 patient looks reasonably well with a clear understanding what is going onPatient with Klebsiella and urine of presses urine blood-tinged improving\WBC down to 7.9 from 22 creatinine 0.757/11/2023 sp bilateral stent passed stone r uretr has left midOn ceftin sepsis PAR for urs on left remove stent rightUa wbc  09/15/2023 patient is status post bilateral ureteral obstruction urgent bilateral stent placedPatient did have right ureteral stone Past had a difficult ureteroscopy on the left side secondary to 4 mm stone in the mid proximal ureterCurrently has stent placed since middle of JulyAdmitted for fever sepsisHopefully during this hospitalization will require ureteroscopy probably next week and recommendations to follow10/01/2023 schedle cysto urs in am PAR all discussed10/03/2023 sp urs with stone extraction Pt no issues but wbc 46!!No fever low BPLytes normalCt some mild fluid and mild hydro stent good position fluid around kidney11/05/2023 lengthy discussion concerning with the patient and conservativelyPatient with a mid ureteral stricture complicated with ureteral calculiHas had 2 ureteroscopy is both very difficult suspect patient has significant stricture in the mid ureterAll options were discussed with the patient and family including removing the stent in evaluatedMay we may require percutaneous and reconstructionOr we can change the stent every 3-6 months without issueCystoscopy to see calcifications after 1 month todayCystoscopy showed minimal calcification after 1 month 02/01/2019 5 patient with dense ureteral stricture managed with stent exchangesLast stent exchange was October 2024Here for [place] evaluation of distal stent to see if this calcificationWill need stent exchangePatient 4 months out with partial lower calcifications stentHPI Past Medical HistoryPast Medical History    Past Medical History: Diagnosis Date  Acute embolism and thrombosis of unspecified femoral vein (HC Code)    Anemia    Cellulitis and abscess of right lower extremity    Dysphagia    Encephalopathy    GERD (gastroesophageal reflux disease)    Hypercalcemia    Paraplegia (HC Code)    Rheumatoid arthritis (HC Code)     Past Surgical HistoryPast Surgical History     Past Surgical History: Procedure Laterality Date  BAND HEMORRHOIDECTOMY      CYSTECTOMY   1972  LAPAROSCOPIC SALPINGOOPHERECTOMY   1986  LAPAROTOMY   2022  PARTIAL COLECTOMY   2022  PEG TUBE PLACEMENT   01/2022  UPPER GASTROINTESTINAL ENDOSCOPY       AllergiesNo Known [email or web address] @ Social HistorySocial History     Tobacco Use  Smoking status: Never  Smokeless tobacco: Never  Family History No family history on file. Review of SystemsNo symptoms beyond those noted in HPI and PMH- all other systems are negative Physical ExamBP 103/66  - Pulse 78  - Temp 99.8 ?F (37.7 ?C) (Temporal)  - Resp 20  - Ht 5' 10 (1.778 m)  - Wt 65.6 kg  - SpO2 95%  - BMI 20.75 kg/m?      Lab Results Component Value Date   WBC 7.9 06/29/2023   HGB 9.1 (L) 06/29/2023   HCT 28.80 (L) 06/29/2023   PLT 176 06/29/2023   ALT 8 (L) 06/29/2023   AST 9 06/29/2023   NA 144 06/29/2023   K 3.2 (L) 06/29/2023   CL 116 (H) 06/29/2023   CREATININE 0.75 06/29/2023   BUN 9 06/29/2023   CO2 23 06/29/2023  TSH 0.527 03/09/2023   INR 1.08 06/28/2023   GLU 88 06/29/2023   HGBA1C 5.5 03/09/2023   No results found for: UCOLOR, UGLUCOSE, UKETONE, USPECGRAVITY, UPH, UPROTEIN, UNITRATES, UBLOOD, ULEUKOCYTES, UA  No results found for: PVRPOCT     Previous resultsNo results found for: TESTOSTERONE  AUA Symptom Score today:       No data to display       02/11/2025Using a sterile technique and prep, the patient was draped in the dorsal lithotomy position, video cystoscopy was performed using the flexible cystoscope and sterile water. Residual Urine: MinimalVagina: Normal appearing vagina without lesions or dischargeUrethra: No abnormalities of the urethra are noted. Bladder: Bladder shows no abnormalities. No trabeculation. No foreign bodies. No evidence of tumor or stones. Normal bladder expansion. 5 months stent partially calcifiedUreter: Clear efflux noted both orifices. Orifices normal configuration and location. The cystoscope was removed. The patient tolerated the procedure well. Left stent seen minimally calcifications PATHOLOGY AND BIOPSIES   Patient with multiple issues left renal stone left ureteral stricture lengthy discussion with family    Assessment & Plan: Danielle [name] is a 68 y.o  left ureteral stricture managed with indwelling stentsHigh-risk for reconstructive surgeryDef treat ua cs Stent exchange soon patient basically will be 5 monthsTrear long term if positive consider Hiprex after treating UTI

## 2024-02-02 NOTE — Progress Notes
 OR consent form signed paper consent, scanned in to chart, for cystoscopy LEFT stent exchange. ERAS pre op instructions given to patient verbally and in written form and Pedro A Portman verbalized understanding.   Tessie A Dearinger encouraged to call the office if any questions arise.

## 2024-02-02 NOTE — Telephone Encounter
 [facility] pre op instructions given to pt and to bring to facilityPaper consent signed and scanned into chart.

## 2024-02-02 NOTE — Progress Notes
 Danielle [name] is here today for a cystoscopy. Unable to void, straight cath 12 Fr. Sterile procedure, sent to lab.  Patient informed of procedure and questions answered.  Instructions for pre procedure given to patient.  Written consent and time out obtained.  Patient prepped according to protocol.  Tolerated procedure well.  VSS. Fosfomycin administered per protocol.  Post procedure instructions reviewed with patient.Paper consent in chart

## 2024-02-03 NOTE — Telephone Encounter
 Called back and spoke with Danielle [name] them pt had straight cath, and Cysto UC sent to lab, awaiting results to prescribe abxFosfomycin administered.Should have stent exchange in March, Paper consent signed, and in chart,Pre op instructions given to patient to take to facility, Note from MD :Danielle [name] is a 68 y.o  left ureteral stricture managed with indwelling stentsHigh-risk for reconstructive surgeryDef treat ua cs Stent exchange soon patient basically will be 5 monthsTrear long term if positive consider Hiprex after treating UTINaomi verbalized understanding

## 2024-02-03 NOTE — Telephone Encounter
 Called [facility], asked for person in charge of pt whom I can schedule surgery with. Receptionist transferred me to vm of Rose, LM for [facility] asking for cb to schedule pt for procedure for stent exchange [facility] MAIN OR Dr Hulen Luster. Gave my direct# [phone].----- Message -----From: [name], MDSent: 02/02/2024  11:28 AM ESTTo: Santiago Glad stent exgnge only

## 2024-02-04 ENCOUNTER — Telehealth: Admit: 2024-02-04 | Payer: PRIVATE HEALTH INSURANCE | Attending: Urology | Primary: Nephrology

## 2024-02-04 NOTE — Telephone Encounter
 Rcvd call fm Rose @ [facility].  Cnfmd surgery for pt on 02/25/24 w Dr Hulen Luster at [facility] MAIN OR Pt will have labs + clearance fm MD at [facility].  Fax sent to [facility] fax# [phone].Pre-op instr in AVS on 02/02/24.~~ Nursing - pls contact Rose with pre-op instr, her direct# [phone] you.

## 2024-02-04 NOTE — Telephone Encounter
 Error , sorry.

## 2024-02-06 LAB — URINE CULTURE: BKR URINE CULTURE, ROUTINE: >=100000 — AB

## 2024-02-08 NOTE — Telephone Encounter
 I called and spoke with Seward Grater at [facility] to inform her of antibiotic order (Bactrim) for [employer]. Nursing at [facility] will straight catheterize Terese Door on Friday 02/19/24 for a urine culture as pt should have completed the 10-day course of Bactrim by that time. Maggie verbalized understanding and had no further questions. [name], MD  [facility] [place] Nursing Pool41 minutes ago (12:28 PM) NSBactrim ds 1 po bid 1O DAY REPEAT STRAIGT CATH URINE PRIOR

## 2024-02-08 NOTE — Telephone Encounter
--   I called and spoke with Rose at [facility] regarding pre-op instructions and she had no questions at this time. The written instructions had been received.Rcvd call fm Rose @ [facility].  Cnfmd surgery for pt on 02/25/24 w Dr Hulen Luster at [facility] MAIN OR Pt will have labs + clearance fm MD at [facility].  Fax sent to [facility] fax# [phone].Pre-op instr in AVS on 02/02/24.~~ Nursing - pls contact Rose with pre-op instr, her direct# [phone] you.

## 2024-02-08 NOTE — Telephone Encounter
 [facility] MESSAGETime of call:   9:31 AMCaller:   [name], Danielle ACaller's relationship to patient: PatientSpecialist you are calling for: Dr Earney Navy for call: Maggie @ [facility]. called in to speak with a nurse/Md regarding antibiotics for the patient, maggie states facility was called and told patient had a positive UC but no medication was sent in for patient, asking for a callback to discuss further. Does caller request to speak to someone urgently?  yes   Best telephone number for callback: [phone] time to return call:  anytimePermission to leave message:  yes

## 2024-02-08 NOTE — Telephone Encounter
 Pt scheduled for Cystoscopy stone removal with uretheral stent exhange on 02/25/24 and Maggie at [facility]  is calling in for antibiotic. Best telephone number for callback: [phone] Results: Urine cultureAbnormal Abnormal Status: Preliminary result (Collected: 02/02/2024 11:36 AM) Associated DiagnosesName ICD-10-CM Ureteral stricture, left N13.5 Order QuestionsQuestion Answer Release to patient Immediate     Contains abnormal data Urine cultureOrder: [id number] Status: Preliminary result   Visible to patient: No (not released)   Next appt: 04/19/2024 at 10:25 AM in Lab ([place] Lab Technician)   Dx: Ureteral stricture, left  Specimen Information: Clean Catch (Voided); Urine 0 Result NotesUrine Culture, Routine >=100,000 CFU/mL Escherichia coli Abnormal  Ceftriaxone resistance in this organism indicates the potential for extended spectrum B-lactamase (ESBL) production for which other beta-lactam antibiotics such as ceftazidime, cefepime, and aztreonam would not be effective therapy options. Confirmatory testing for ESBL production is not indicated.  Culture, organism identification, and/or susceptibility results may have been generated with a non-FDA approved method. All methods used in this report have been validated by [facility] for clinical use.  Resulting Agency: [facility] Susceptibility Escherichia coli   MIC SUSCEPTIBILITY KIRBY BAUER SUSCEPTIBILITY   Amikacin Susceptible    Ampicillin Resistant    Ampicillin + Sulbactam Resistant    Cefazolin Resistant 1    Ceftriaxone Resistant    Cefuroxime Resistant    Ciprofloxacin Resistant    Ertapenem Susceptible    Fosfomycin  Susceptible   Gentamicin Resistant    Nitrofurantoin Resistant    Piperacillin + Tazobactam Intermediate    Tobramycin Resistant    Trimethoprim + Sulfamethoxazole Susceptible           1 For uncomplicated UTI caused by E.coli, K.pneumoniae, or P.mirabilis, results of cefazolin testing can be used to predict results to the following oral agents: cefdinir, cefuroxime axetil, and cephalexin.    Linear View Susceptibility CommentsEscherichia coli Current FDA interpretive criteria used. Susceptible corresponds to an amikacin MIC </= 16 mcg/mL. If used for treatment of infections outside the urinary tract, consult Infectious Diseases or the Antimicrobial Stewardship Team.  Specimen Collected: 02/02/24 11:36 AM Last Resulted: 02/06/24  1:59 PM

## 2024-02-10 ENCOUNTER — Other Ambulatory Visit: Admit: 2024-02-10 | Payer: BLUE CROSS/BLUE SHIELD | Attending: Nephrology | Primary: Nephrology

## 2024-02-10 DIAGNOSIS — R293 Abnormal posture: Secondary | ICD-10-CM

## 2024-02-10 DIAGNOSIS — D649 Anemia, unspecified: Secondary | ICD-10-CM

## 2024-02-10 DIAGNOSIS — G629 Polyneuropathy, unspecified: Secondary | ICD-10-CM

## 2024-02-10 LAB — CBC WITH AUTO DIFFERENTIAL
BKR WAM ABSOLUTE IMMATURE GRANULOCYTES.: 0.01 x 1000/ÂµL (ref 0.00–0.30)
BKR WAM ABSOLUTE LYMPHOCYTE COUNT.: 1.32 x 1000/ÂµL (ref 0.60–3.70)
BKR WAM ABSOLUTE NRBC (2 DEC): 0 x 1000/ÂµL (ref 0.00–1.00)
BKR WAM ANC (ABSOLUTE NEUTROPHIL COUNT): 2.91 x 1000/ÂµL (ref 2.00–7.60)
BKR WAM BASOPHIL ABSOLUTE COUNT.: 0.03 x 1000/ÂµL (ref 0.00–1.00)
BKR WAM BASOPHILS: 0.6 % (ref 0.0–1.4)
BKR WAM EOSINOPHIL ABSOLUTE COUNT.: 0.31 x 1000/ÂµL (ref 0.00–1.00)
BKR WAM EOSINOPHILS: 6 % — ABNORMAL HIGH (ref 0.0–5.0)
BKR WAM HEMATOCRIT (2 DEC): 31.6 % — ABNORMAL LOW (ref 35.00–45.00)
BKR WAM HEMOGLOBIN: 10 g/dL — ABNORMAL LOW (ref 11.7–15.5)
BKR WAM IMMATURE GRANULOCYTES: 0.2 % (ref 0.0–1.0)
BKR WAM LYMPHOCYTES: 25.5 % (ref 17.0–50.0)
BKR WAM MCH (PG): 28.8 pg (ref 27.0–33.0)
BKR WAM MCHC: 31.6 g/dL (ref 31.0–36.0)
BKR WAM MCV: 91.1 fL (ref 80.0–100.0)
BKR WAM MONOCYTE ABSOLUTE COUNT.: 0.59 x 1000/ÂµL (ref 0.00–1.00)
BKR WAM MONOCYTES: 11.4 % (ref 4.0–12.0)
BKR WAM MPV: 10.7 fL (ref 8.0–12.0)
BKR WAM NEUTROPHILS: 56.3 % (ref 39.0–72.0)
BKR WAM NUCLEATED RED BLOOD CELLS: 0 % (ref 0.0–1.0)
BKR WAM PLATELETS: 218 x1000/ÂµL (ref 150–420)
BKR WAM RDW-CV: 15.8 % — ABNORMAL HIGH (ref 11.0–15.0)
BKR WAM RED BLOOD CELL COUNT.: 3.47 M/ÂµL — ABNORMAL LOW (ref 4.00–6.00)
BKR WAM WHITE BLOOD CELL COUNT: 5.2 x1000/ÂµL (ref 4.0–11.0)

## 2024-02-10 LAB — BASIC METABOLIC PANEL
BKR ANION GAP: 5 (ref 5–18)
BKR BLOOD UREA NITROGEN: 12 mg/dL (ref 8–25)
BKR BUN / CREAT RATIO: 9.8 (ref 8.0–25.0)
BKR CALCIUM: 10.3 mg/dL (ref 8.4–10.3)
BKR CHLORIDE: 115 mmol/L (ref 95–115)
BKR CO2: 21 mmol/L (ref 21–32)
BKR CREATININE DELTA: 0.31 — ABNORMAL HIGH
BKR CREATININE: 1.22 mg/dL (ref 0.50–1.30)
BKR EGFR, CREATININE (CKD-EPI 2021): 48 mL/min/{1.73_m2} — ABNORMAL LOW (ref >=60)
BKR GLUCOSE: 66 mg/dL — ABNORMAL LOW (ref 70–100)
BKR OSMOLALITY CALCULATION: 279 mosm/kg (ref 275–295)
BKR POTASSIUM: 4.3 mmol/L (ref 3.5–5.1)
BKR SODIUM: 141 mmol/L (ref 136–145)

## 2024-02-12 ENCOUNTER — Other Ambulatory Visit: Admit: 2024-02-12 | Payer: BLUE CROSS/BLUE SHIELD | Attending: Nephrology | Primary: Nephrology

## 2024-02-12 DIAGNOSIS — M6281 Muscle weakness (generalized): Secondary | ICD-10-CM

## 2024-02-12 LAB — POTASSIUM: BKR POTASSIUM: 4.5 mmol/L (ref 3.5–5.1)

## 2024-02-15 ENCOUNTER — Other Ambulatory Visit: Admit: 2024-02-15 | Payer: BLUE CROSS/BLUE SHIELD | Attending: Nephrology | Primary: Nephrology

## 2024-02-15 DIAGNOSIS — N10 Acute tubulo-interstitial nephritis: Secondary | ICD-10-CM

## 2024-02-15 DIAGNOSIS — D649 Anemia, unspecified: Secondary | ICD-10-CM

## 2024-02-15 LAB — CBC WITH AUTO DIFFERENTIAL
BKR WAM ABSOLUTE IMMATURE GRANULOCYTES.: 0.02 x 1000/ÂµL (ref 0.00–0.30)
BKR WAM ABSOLUTE LYMPHOCYTE COUNT.: 1.46 x 1000/ÂµL (ref 0.60–3.70)
BKR WAM ABSOLUTE NRBC (2 DEC): 0 x 1000/ÂµL (ref 0.00–1.00)
BKR WAM ANC (ABSOLUTE NEUTROPHIL COUNT): 3.02 x 1000/ÂµL (ref 2.00–7.60)
BKR WAM BASOPHIL ABSOLUTE COUNT.: 0.04 x 1000/ÂµL (ref 0.00–1.00)
BKR WAM BASOPHILS: 0.7 % (ref 0.0–1.4)
BKR WAM EOSINOPHIL ABSOLUTE COUNT.: 0.33 x 1000/ÂµL (ref 0.00–1.00)
BKR WAM EOSINOPHILS: 6.1 % — ABNORMAL HIGH (ref 0.0–5.0)
BKR WAM HEMATOCRIT (2 DEC): 31 % — ABNORMAL LOW (ref 35.00–45.00)
BKR WAM HEMOGLOBIN: 9.8 g/dL — ABNORMAL LOW (ref 11.7–15.5)
BKR WAM IMMATURE GRANULOCYTES: 0.4 % (ref 0.0–1.0)
BKR WAM LYMPHOCYTES: 26.8 % (ref 17.0–50.0)
BKR WAM MCH (PG): 28.7 pg (ref 27.0–33.0)
BKR WAM MCHC: 31.6 g/dL (ref 31.0–36.0)
BKR WAM MCV: 90.6 fL (ref 80.0–100.0)
BKR WAM MONOCYTE ABSOLUTE COUNT.: 0.57 x 1000/ÂµL (ref 0.00–1.00)
BKR WAM MONOCYTES: 10.5 % (ref 4.0–12.0)
BKR WAM MPV: 10.4 fL (ref 8.0–12.0)
BKR WAM NEUTROPHILS: 55.5 % (ref 39.0–72.0)
BKR WAM NUCLEATED RED BLOOD CELLS: 0 % (ref 0.0–1.0)
BKR WAM PLATELETS: 266 x1000/ÂµL (ref 150–420)
BKR WAM RDW-CV: 16 % — ABNORMAL HIGH (ref 11.0–15.0)
BKR WAM RED BLOOD CELL COUNT.: 3.42 M/ÂµL — ABNORMAL LOW (ref 4.00–6.00)
BKR WAM WHITE BLOOD CELL COUNT: 5.4 x1000/ÂµL (ref 4.0–11.0)

## 2024-02-15 LAB — BASIC METABOLIC PANEL
BKR ANION GAP: 9 (ref 5–18)
BKR BLOOD UREA NITROGEN: 15 mg/dL (ref 8–25)
BKR BUN / CREAT RATIO: 11.4 (ref 8.0–25.0)
BKR CALCIUM: 10.3 mg/dL (ref 8.4–10.3)
BKR CHLORIDE: 113 mmol/L (ref 95–115)
BKR CO2: 20 mmol/L — ABNORMAL LOW (ref 21–32)
BKR CREATININE DELTA: 0.1
BKR CREATININE: 1.32 mg/dL — ABNORMAL HIGH (ref 0.50–1.30)
BKR EGFR, CREATININE (CKD-EPI 2021): 44 mL/min/{1.73_m2} — ABNORMAL LOW (ref >=60)
BKR GLUCOSE: 95 mg/dL (ref 70–100)
BKR OSMOLALITY CALCULATION: 284 mosm/kg (ref 275–295)
BKR POTASSIUM: 4.8 mmol/L (ref 3.5–5.1)
BKR SODIUM: 142 mmol/L (ref 136–145)

## 2024-02-16 ENCOUNTER — Telehealth: Admit: 2024-02-16 | Payer: PRIVATE HEALTH INSURANCE | Attending: Urology | Primary: Nephrology

## 2024-02-16 NOTE — Telephone Encounter
 Maggie RN contacted from [facility] informed that Dr Hulen Luster preference is to stop Eliquis 3 days prior to surgery but defers to the prescribing MD.  Seward Grater verbalized understanding.

## 2024-02-16 NOTE — Telephone Encounter
 Copied from CRM #[id number]. Topic: General Message - [facility]>> Feb 16, 2024 11:49 AM Monica Martinez wrote:[facility]ARE CENTER MESSAGETime of call:   11:49 AMCaller:   Maximiano Coss for call:   Maggie from [facility] is calling to speak with a nurse regarding medication pt is on.  She wants to know if pt should stop the eloquist 7 days or 48 hours prior to surgery. Please call Maggie at [phone].  Janyth Contes [facility] Referral Specialist

## 2024-02-18 ENCOUNTER — Encounter: Admit: 2024-02-18 | Payer: PRIVATE HEALTH INSURANCE | Attending: Urology | Primary: Nephrology

## 2024-02-18 DIAGNOSIS — R627 Adult failure to thrive: Secondary | ICD-10-CM

## 2024-02-18 DIAGNOSIS — A499 Bacterial infection, unspecified: Secondary | ICD-10-CM

## 2024-02-18 DIAGNOSIS — G9340 Encephalopathy, unspecified: Secondary | ICD-10-CM

## 2024-02-18 DIAGNOSIS — D649 Anemia, unspecified: Secondary | ICD-10-CM

## 2024-02-18 DIAGNOSIS — I82419 Acute embolism and thrombosis of unspecified femoral vein: Secondary | ICD-10-CM

## 2024-02-18 DIAGNOSIS — E099 Drug or chemical induced diabetes mellitus without complications: Secondary | ICD-10-CM

## 2024-02-18 DIAGNOSIS — R1310 Dysphagia, unspecified: Secondary | ICD-10-CM

## 2024-02-18 DIAGNOSIS — L03115 Cellulitis of right lower limb: Secondary | ICD-10-CM

## 2024-02-18 DIAGNOSIS — M069 Rheumatoid arthritis, unspecified: Secondary | ICD-10-CM

## 2024-02-18 DIAGNOSIS — R32 Unspecified urinary incontinence: Secondary | ICD-10-CM

## 2024-02-18 DIAGNOSIS — K5792 Diverticulitis of intestine, part unspecified, without perforation or abscess without bleeding: Secondary | ICD-10-CM

## 2024-02-18 DIAGNOSIS — Z932 Ileostomy status: Secondary | ICD-10-CM

## 2024-02-18 DIAGNOSIS — K219 Gastro-esophageal reflux disease without esophagitis: Secondary | ICD-10-CM

## 2024-02-18 DIAGNOSIS — N200 Calculus of kidney: Secondary | ICD-10-CM

## 2024-02-18 DIAGNOSIS — G8220 Paraplegia, unspecified: Secondary | ICD-10-CM

## 2024-02-18 NOTE — Telephone Encounter
 LM for Rose at [facility]. asking for pre-op fax to be faxed to me at fax [phone].  Also sent req by fax

## 2024-02-18 NOTE — Other
 Preop telephone interview with Nurse Seward Grater [facility]).  Patient is a 68yr old female scheduled on 02/25/2024 for Cystoscopy stone removal with uretheral stent exhange - LeftProvider: [name], MDPre/postop instructions reviewed including not to bring valuables, NPO status, same day surgery.  Pending confirmation of surgery time per surgeon's office. Encouraged to hydrate days before surgery. Verbalized understanding of all of the above. Patient will be accompanied by sister who is her conservator. Patient is a resident at  [facility].  She is wheelchair bund, transfers with board or max assist x 2.  She is incontinent of urine, ileostomy in place. Patient is verbal, follows commands but  has memory problems.Nurse Maggie to send copy of meds, H&P and labs.Will stop ELIQUIS 72 hours preop.

## 2024-02-19 ENCOUNTER — Other Ambulatory Visit: Admit: 2024-02-19 | Payer: BLUE CROSS/BLUE SHIELD | Attending: Nephrology | Primary: Nephrology

## 2024-02-19 DIAGNOSIS — D649 Anemia, unspecified: Secondary | ICD-10-CM

## 2024-02-19 DIAGNOSIS — N10 Acute tubulo-interstitial nephritis: Secondary | ICD-10-CM

## 2024-02-19 DIAGNOSIS — B964 Proteus (mirabilis) (morganii) as the cause of diseases classified elsewhere: Secondary | ICD-10-CM

## 2024-02-19 DIAGNOSIS — N390 Urinary tract infection, site not specified: Secondary | ICD-10-CM

## 2024-02-19 LAB — URINALYSIS-MACROSCOPIC W/REFLEX MICROSCOPIC
BKR BILIRUBIN, UA: NEGATIVE
BKR GLUCOSE, UA: NEGATIVE
BKR KETONES, UA: NEGATIVE
BKR NITRITE, UA: NEGATIVE
BKR PH, UA: 6.5 (ref 5.5–7.5)
BKR SPECIFIC GRAVITY, UA: 1.01 (ref 1.005–1.030)
BKR UROBILINOGEN, UA (MG/DL): <2 mg/dL (ref <=2.0)

## 2024-02-19 LAB — CBC WITH AUTO DIFFERENTIAL
BKR WAM ABSOLUTE IMMATURE GRANULOCYTES.: 0.02 x 1000/ÂµL (ref 0.00–0.30)
BKR WAM ABSOLUTE LYMPHOCYTE COUNT.: 1.17 x 1000/ÂµL (ref 0.60–3.70)
BKR WAM ABSOLUTE NRBC (2 DEC): 0 x 1000/ÂµL (ref 0.00–1.00)
BKR WAM ANC (ABSOLUTE NEUTROPHIL COUNT): 3.27 x 1000/ÂµL (ref 2.00–7.60)
BKR WAM BASOPHIL ABSOLUTE COUNT.: 0.04 x 1000/ÂµL (ref 0.00–1.00)
BKR WAM BASOPHILS: 0.7 % (ref 0.0–1.4)
BKR WAM EOSINOPHIL ABSOLUTE COUNT.: 0.36 x 1000/ÂµL (ref 0.00–1.00)
BKR WAM EOSINOPHILS: 6.6 % — ABNORMAL HIGH (ref 0.0–5.0)
BKR WAM HEMATOCRIT (2 DEC): 30.2 % — ABNORMAL LOW (ref 35.00–45.00)
BKR WAM HEMOGLOBIN: 9.3 g/dL — ABNORMAL LOW (ref 11.7–15.5)
BKR WAM IMMATURE GRANULOCYTES: 0.4 % (ref 0.0–1.0)
BKR WAM LYMPHOCYTES: 21.6 % (ref 17.0–50.0)
BKR WAM MCH (PG): 28.2 pg (ref 27.0–33.0)
BKR WAM MCHC: 30.8 g/dL — ABNORMAL LOW (ref 31.0–36.0)
BKR WAM MCV: 91.5 fL (ref 80.0–100.0)
BKR WAM MONOCYTE ABSOLUTE COUNT.: 0.56 x 1000/ÂµL (ref 0.00–1.00)
BKR WAM MONOCYTES: 10.3 % (ref 4.0–12.0)
BKR WAM MPV: 10.1 fL (ref 8.0–12.0)
BKR WAM NEUTROPHILS: 60.4 % (ref 39.0–72.0)
BKR WAM NUCLEATED RED BLOOD CELLS: 0 % (ref 0.0–1.0)
BKR WAM PLATELETS: 313 x1000/ÂµL (ref 150–420)
BKR WAM RDW-CV: 15.9 % — ABNORMAL HIGH (ref 11.0–15.0)
BKR WAM RED BLOOD CELL COUNT.: 3.3 M/ÂµL — ABNORMAL LOW (ref 4.00–6.00)
BKR WAM WHITE BLOOD CELL COUNT: 5.4 x1000/ÂµL (ref 4.0–11.0)

## 2024-02-19 LAB — URINE MICROSCOPIC     (BH GH LMW YH)
BKR HYALINE CASTS, UA INSTRUMENT (NUMERIC): 1 /LPF (ref 0–3)
BKR RBC/HPF INSTRUMENT: 8 /HPF — ABNORMAL HIGH (ref 0–2)
BKR URINE SQUAMOUS EPITHELIAL CELLS, UA (NUMERIC): <1 /HPF (ref 0–5)
BKR WBC/HPF INSTRUMENT: 16 /HPF — ABNORMAL HIGH (ref 0–5)

## 2024-02-20 LAB — URINE CULTURE

## 2024-02-24 ENCOUNTER — Encounter: Admit: 2024-02-24 | Payer: PRIVATE HEALTH INSURANCE | Attending: Urology | Primary: Nephrology

## 2024-02-24 ENCOUNTER — Telehealth: Admit: 2024-02-24 | Payer: PRIVATE HEALTH INSURANCE | Attending: Urology | Primary: Nephrology

## 2024-02-24 NOTE — H&P
 Sp urs stent x2 patient with dense mid ureteral stricture last stent change October 3rd stent change and ureteroscopy laser today  HPICrystal A Gilani is a 68 y.o. female  status post bilateral stent placement for urosepsisPatient had passed a right ureteral stone has not asymptomatic left ureteral stonePatient more alert todayUrine blood-tinged07/07/2023 patient looks reasonably well with a clear understanding what is going onPatient with Klebsiella and urine of presses urine blood-tinged improving\WBC down to 7.9 from 22 creatinine 0.757/11/2023 sp bilateral stent passed stone r uretr has left midOn ceftin sepsis PAR for urs on left remove stent rightUa wbc  09/15/2023 patient is status post bilateral ureteral obstruction urgent bilateral stent placedPatient did have right ureteral stone Past had a difficult ureteroscopy on the left side secondary to 4 mm stone in the mid proximal ureterCurrently has stent placed since middle of JulyAdmitted for fever sepsisHopefully during this hospitalization will require ureteroscopy probably next week and recommendations to follow10/01/2023 schedle cysto urs in am PAR all discussed10/03/2023 sp urs with stone extraction Pt no issues but wbc 46!!No fever low BPLytes normalCt some mild fluid and mild hydro stent good position fluid around kidney11/05/2023 lengthy discussion concerning with the patient and conservativelyPatient with a mid ureteral stricture complicated with ureteral calculiHas had 2 ureteroscopy is both very difficult suspect patient has significant stricture in the mid ureterAll options were discussed with the patient and family including removing the stent in evaluatedMay we may require percutaneous and reconstructionOr we can change the stent every 3-6 months without issueCystoscopy to see calcifications after 1 month todayCystoscopy showed minimal calcification after 1 month 02/01/2019 5 patient with dense ureteral stricture managed with stent exchangesLast stent exchange was October 2024Here for [place] evaluation of distal stent to see if this calcificationWill need stent exchangePatient 4 months out with partial lower calcifications stent03/04/2024 patient history of bilateral stones impacted mid ureteral stone leading to stricture has periodic stent replacementRecent KUB shows a 3 mm fragmentsWill exchange stent removed stone evaluate the strictureHPI Past Medical HistoryPast Medical History    Past Medical History: Diagnosis Date  Acute embolism and thrombosis of unspecified femoral vein (HC Code)    Anemia    Cellulitis and abscess of right lower extremity    Dysphagia    Encephalopathy    GERD (gastroesophageal reflux disease)    Hypercalcemia    Paraplegia (HC Code)    Rheumatoid arthritis (HC Code)     Past Surgical HistoryPast Surgical History     Past Surgical History: Procedure Laterality Date  BAND HEMORRHOIDECTOMY      CYSTECTOMY   1972  LAPAROSCOPIC SALPINGOOPHERECTOMY   1986  LAPAROTOMY   2022  PARTIAL COLECTOMY   2022  PEG TUBE PLACEMENT   01/2022  UPPER GASTROINTESTINAL ENDOSCOPY       AllergiesNo Known AllergiesMedications@[email or web address] @ Social HistorySocial History     Tobacco Use  Smoking status: Never  Smokeless tobacco: Never  Family History No family history on file. Review of SystemsNo symptoms beyond those noted in HPI and PMH- all other systems are negative Physical ExamBP 103/66  - Pulse 78  - Temp 99.8 ?F (37.7 ?C) (Temporal)  - Resp 20  - Ht 5' 10 (1.778 m)  - Wt 65.6 kg  - SpO2 95%  - BMI 20.75 kg/m?  Cor regular rate rhythmLungs clearAbdomen soft nontender    Lab Results Component Value Date   WBC 7.9 06/29/2023   HGB 9.1 (L) 06/29/2023   HCT 28.80 (L) 06/29/2023   PLT 176 06/29/2023  ALT 8 (L) 06/29/2023   AST 9 06/29/2023   NA 144 06/29/2023   K 3.2 (L) 06/29/2023   CL 116 (H) 06/29/2023   CREATININE 0.75 06/29/2023   BUN 9 06/29/2023   CO2 23 06/29/2023   TSH 0.527 03/09/2023   INR 1.08 06/28/2023   GLU 88 06/29/2023   HGBA1C 5.5 03/09/2023   No results found for: UCOLOR, UGLUCOSE, UKETONE, USPECGRAVITY, UPH, UPROTEIN, UNITRATES, UBLOOD, ULEUKOCYTES, UA  No results found for: PVRPOCT     Previous resultsNo results found for: TESTOSTERONE  AUA Symptom Score today:       No data to display       02/11/2025Using a sterile technique and prep, the patient was draped in the dorsal lithotomy position, video cystoscopy was performed using the flexible cystoscope and sterile water. Residual Urine: MinimalVagina: Normal appearing vagina without lesions or dischargeUrethra: No abnormalities of the urethra are noted. Bladder: Bladder shows no abnormalities. No trabeculation. No foreign bodies. No evidence of tumor or stones. Normal bladder expansion. 5 months stent partially calcifiedUreter: Clear efflux noted both orifices. Orifices normal configuration and location. The cystoscope was removed. The patient tolerated the procedure well. Left stent seen minimally calcifications PATHOLOGY AND BIOPSIES   Patient with multiple issues left renal stone left ureteral stricture lengthy discussion with family    Assessment & Plan: TACARRA JUSTO is a 68 y.o  left ureteral stricture managed with indwelling stentsHigh-risk for reconstructive surgeryDef treat ua cs Stent exchange soon patient basically will be 5 months but now has a new distal stonesTrear long term if positive consider Hiprex after treating UTI

## 2024-02-24 NOTE — Telephone Encounter
 Called patient informed her she needs to sign another consent for left laser lithotripsy, upon arrival, e consent in chart, pt should be able to sign upon arrival. Paper consent signed previously and in chart as well. Stayed off Eliquis as of yesterday, no ASA no vitamins, should be all set. Will accept blood products.Pt verbalized understandning

## 2024-02-25 ENCOUNTER — Ambulatory Visit: Admit: 2024-02-25 | Payer: PRIVATE HEALTH INSURANCE | Attending: Certified Registered" | Primary: Nephrology

## 2024-02-25 ENCOUNTER — Inpatient Hospital Stay: Admit: 2024-02-25 | Discharge: 2024-02-25 | Payer: PRIVATE HEALTH INSURANCE | Attending: Urology | Primary: Nephrology

## 2024-02-25 ENCOUNTER — Ambulatory Visit: Admit: 2024-02-25 | Payer: PRIVATE HEALTH INSURANCE | Primary: Nephrology

## 2024-02-25 ENCOUNTER — Encounter: Admit: 2024-02-25 | Payer: PRIVATE HEALTH INSURANCE | Attending: Urology | Primary: Nephrology

## 2024-02-25 DIAGNOSIS — N135 Crossing vessel and stricture of ureter without hydronephrosis: Secondary | ICD-10-CM

## 2024-02-25 DIAGNOSIS — Z79899 Other long term (current) drug therapy: Secondary | ICD-10-CM

## 2024-02-25 DIAGNOSIS — Z87442 Personal history of urinary calculi: Secondary | ICD-10-CM

## 2024-02-25 DIAGNOSIS — Z681 Body mass index (BMI) 19 or less, adult: Secondary | ICD-10-CM

## 2024-02-25 DIAGNOSIS — E119 Type 2 diabetes mellitus without complications: Secondary | ICD-10-CM

## 2024-02-25 DIAGNOSIS — Z90722 Acquired absence of ovaries, bilateral: Secondary | ICD-10-CM

## 2024-02-25 DIAGNOSIS — R627 Adult failure to thrive: Secondary | ICD-10-CM

## 2024-02-25 DIAGNOSIS — N200 Calculus of kidney: Secondary | ICD-10-CM

## 2024-02-25 DIAGNOSIS — Z7901 Long term (current) use of anticoagulants: Secondary | ICD-10-CM

## 2024-02-25 DIAGNOSIS — Z9079 Acquired absence of other genital organ(s): Secondary | ICD-10-CM

## 2024-02-25 DIAGNOSIS — Z86718 Personal history of other venous thrombosis and embolism: Secondary | ICD-10-CM

## 2024-02-25 DIAGNOSIS — I82419 Acute embolism and thrombosis of unspecified femoral vein: Secondary | ICD-10-CM

## 2024-02-25 DIAGNOSIS — K5792 Diverticulitis of intestine, part unspecified, without perforation or abscess without bleeding: Secondary | ICD-10-CM

## 2024-02-25 DIAGNOSIS — K219 Gastro-esophageal reflux disease without esophagitis: Secondary | ICD-10-CM

## 2024-02-25 DIAGNOSIS — R64 Cachexia: Secondary | ICD-10-CM

## 2024-02-25 DIAGNOSIS — L03115 Cellulitis of right lower limb: Secondary | ICD-10-CM

## 2024-02-25 DIAGNOSIS — R1310 Dysphagia, unspecified: Secondary | ICD-10-CM

## 2024-02-25 DIAGNOSIS — R32 Unspecified urinary incontinence: Secondary | ICD-10-CM

## 2024-02-25 DIAGNOSIS — E099 Drug or chemical induced diabetes mellitus without complications: Secondary | ICD-10-CM

## 2024-02-25 DIAGNOSIS — A499 Bacterial infection, unspecified: Secondary | ICD-10-CM

## 2024-02-25 DIAGNOSIS — G8220 Paraplegia, unspecified: Secondary | ICD-10-CM

## 2024-02-25 DIAGNOSIS — D649 Anemia, unspecified: Secondary | ICD-10-CM

## 2024-02-25 DIAGNOSIS — Z932 Ileostomy status: Secondary | ICD-10-CM

## 2024-02-25 DIAGNOSIS — M069 Rheumatoid arthritis, unspecified: Secondary | ICD-10-CM

## 2024-02-25 DIAGNOSIS — G9340 Encephalopathy, unspecified: Secondary | ICD-10-CM

## 2024-02-25 MED ORDER — FENTANYL (PF) 50 MCG/ML INJECTION SOLUTION
50 | INTRAVENOUS | Status: DC | PRN
Start: 2024-02-25 — End: 2024-02-25
  Administered 2024-02-25: 11:00:00 50 mcg/mL via INTRAVENOUS

## 2024-02-25 MED ORDER — ONDANSETRON HCL (PF) 4 MG/2 ML INJECTION SOLUTION
42 | INTRAVENOUS | Status: DC | PRN
Start: 2024-02-25 — End: 2024-02-25
  Administered 2024-02-25: 12:00:00 42 mg/2 mL via INTRAVENOUS

## 2024-02-25 MED ORDER — CEFAZOLIN 1 GRAM SOLUTION FOR INJECTION
1 | Status: CP
Start: 2024-02-25 — End: ?

## 2024-02-25 MED ORDER — PROPOFOL 10 MG/ML INTRAVENOUS EMULSION
10 | Status: CP
Start: 2024-02-25 — End: ?

## 2024-02-25 MED ORDER — IOHEXOL 240 MG IODINE/ML INTRAVENOUS SOLUTION
240 | Status: DC | PRN
Start: 2024-02-25 — End: 2024-02-25
  Administered 2024-02-25: 12:00:00 240 mg iodine/mL

## 2024-02-25 MED ORDER — EPHEDRINE 25 MG/5 ML IN 0.9% SODIUM CHLORIDE (WRAPPED ERX)
2555 | INTRAVENOUS | Status: DC | PRN
Start: 2024-02-25 — End: 2024-02-25
  Administered 2024-02-25: 11:00:00 2555 mg/5 mL (5 mg/mL) via INTRAVENOUS

## 2024-02-25 MED ORDER — PHENYLEPHRINE 1 MG/10 ML (100 MCG/ML) IN 0.9 % SOD.CHLORIDE IV SYRINGE
110100 | INTRAVENOUS | Status: DC | PRN
Start: 2024-02-25 — End: 2024-02-25
  Administered 2024-02-25 (×2): 110100 mg/0 mL (00 mcg/mL) via INTRAVENOUS

## 2024-02-25 MED ORDER — ONDANSETRON HCL (PF) 4 MG/2 ML INJECTION SOLUTION
42 | INTRAVENOUS | Status: DC | PRN
Start: 2024-02-25 — End: 2024-02-26

## 2024-02-25 MED ORDER — MIDAZOLAM 1 MG/ML INJECTION SOLUTION
1 | Status: CP
Start: 2024-02-25 — End: ?

## 2024-02-25 MED ORDER — CEFAZOLIN IV PUSH 1 GRAM VIAL & 0.9% SODIUM CHLORIDE (ADULT)
Freq: Once | INTRAVENOUS | Status: DC
Start: 2024-02-25 — End: 2024-02-26

## 2024-02-25 MED ORDER — SODIUM CHLORIDE 0.9 % (FLUSH) INJECTION SYRINGE
0.9 | Freq: Three times a day (TID) | INTRAVENOUS | Status: DC
Start: 2024-02-25 — End: 2024-02-26

## 2024-02-25 MED ORDER — DIAZEPAM 5 MG TABLET
5 | Freq: Three times a day (TID) | ORAL | Status: DC | PRN
Start: 2024-02-25 — End: 2024-02-26

## 2024-02-25 MED ORDER — MIDAZOLAM 1 MG/ML INJECTION SOLUTION
1 | INTRAVENOUS | Status: DC | PRN
Start: 2024-02-25 — End: 2024-02-25
  Administered 2024-02-25: 11:00:00 1 mg/mL via INTRAVENOUS

## 2024-02-25 MED ORDER — SURGICAL LUBRICANT JELLY TOPICAL
Status: DC | PRN
Start: 2024-02-25 — End: 2024-02-25
  Administered 2024-02-25: 11:00:00 via TOPICAL

## 2024-02-25 MED ORDER — CHLORHEXIDINE GLUCONATE 0.12 % MOUTHWASH
0.12 | Freq: Once | OROMUCOSAL | Status: CP
Start: 2024-02-25 — End: ?
  Administered 2024-02-25: 10:00:00 0.12 mL via OROMUCOSAL

## 2024-02-25 MED ORDER — SODIUM CHLORIDE 0.9 % (FLUSH) INJECTION SYRINGE
0.9 | INTRAVENOUS | Status: DC | PRN
Start: 2024-02-25 — End: 2024-02-26

## 2024-02-25 MED ORDER — IOHEXOL 240 MG IODINE/ML INTRAVENOUS SOLUTION
240 | Status: CP
Start: 2024-02-25 — End: ?

## 2024-02-25 MED ORDER — METHENAMINE HIPPURATE 1 GRAM TABLET
1 | ORAL_TABLET | Freq: Two times a day (BID) | ORAL | 3 refills | Status: AC
Start: 2024-02-25 — End: ?

## 2024-02-25 MED ORDER — FENTANYL (PF) 50 MCG/ML INJECTION SOLUTION
50 | Status: CP
Start: 2024-02-25 — End: ?

## 2024-02-25 MED ORDER — LACTATED RINGERS INTRAVENOUS SOLUTION
INTRAVENOUS | Status: DC
Start: 2024-02-25 — End: 2024-02-26

## 2024-02-25 MED ORDER — ACETAMINOPHEN 500 MG TABLET
500 | Freq: Once | ORAL | Status: CP
Start: 2024-02-25 — End: ?
  Administered 2024-02-25: 10:00:00 500 mg via ORAL

## 2024-02-25 MED ORDER — OXYCODONE IMMEDIATE RELEASE 5 MG TABLET
5 | ORAL | Status: DC | PRN
Start: 2024-02-25 — End: 2024-02-26

## 2024-02-25 MED ORDER — NALOXONE 0.4 MG/ML INJECTION SOLUTION
0.4 | INTRAVENOUS | Status: DC | PRN
Start: 2024-02-25 — End: 2024-02-26

## 2024-02-25 MED ORDER — PROPOFOL 10 MG/ML INTRAVENOUS EMULSION
10 | INTRAVENOUS | Status: DC | PRN
Start: 2024-02-25 — End: 2024-02-25
  Administered 2024-02-25: 11:00:00 10 mL/h via INTRAVENOUS
  Administered 2024-02-25: 11:00:00 10 mg/mL via INTRAVENOUS

## 2024-02-25 MED ORDER — LACTATED RINGERS INTRAVENOUS SOLUTION
INTRAVENOUS | Status: DC | PRN
Start: 2024-02-25 — End: 2024-02-25
  Administered 2024-02-25: 11:00:00 via INTRAVENOUS

## 2024-02-25 MED ORDER — CEFAZOLIN 1 GRAM SOLUTION FOR INJECTION
1 | INTRAVENOUS | Status: DC | PRN
Start: 2024-02-25 — End: 2024-02-25
  Administered 2024-02-25: 11:00:00 1 gram via INTRAVENOUS

## 2024-02-25 MED ORDER — FENTANYL (PF) 50 MCG/ML INJECTION SOLUTION
50 | INTRAVENOUS | Status: DC | PRN
Start: 2024-02-25 — End: 2024-02-26

## 2024-02-25 MED ORDER — HYDROMORPHONE 0.5 MG/0.5 ML INJECTION SYRINGE
0.50.5 | INTRAVENOUS | Status: DC | PRN
Start: 2024-02-25 — End: 2024-02-26

## 2024-02-25 MED ORDER — SODIUM CHLORIDE 0.9 % INTRAVENOUS SOLUTION
Status: CP | PRN
Start: 2024-02-25 — End: ?
  Administered 2024-02-25: 11:00:00

## 2024-02-25 NOTE — Other
@  1300 received pt from PACU Pt A&Ox4, RA,Pt resting in bed, call bell within reach, bed in lowest position. Safety maintained.

## 2024-02-25 NOTE — Other
 Post Anesthesia Transfer of Care NotePatient: Danielle A MaloneProcedure(s) Performed: Procedure(s) (LRB):Cystoscopy with uretheral stent exhange (Left)CYSTOURETHROSCOPY, W/INSERTION, INDWELLING URETERAL STENT (Left)Last Vitals: I have reviewed the post-operative vital signs during the handoff as noted in the Epic chart.POSTOP HANDOFF :      Patient Location:  PACU     Level of Consciousness:  Awake     VS stable since last recorded intra-op set? Yes       Oxygen source: room airPatient co-morbidities, intra-operative course, intake & output and antibiotics as per Anesthesia record were discussed with the RN.

## 2024-02-25 NOTE — Other
 Patient/Family acknowledge understanding of fall prevention education including to call nurse with assistance with ambulation

## 2024-02-25 NOTE — Interval H&P Note
 Subsequent to admission for surgery or invasive procedure, I have reassessed the patient by examination and review of relevant data pertaining to the planned procedure. PT WITH 33 MM STONE MAY NED LASER LITH

## 2024-02-25 NOTE — Anesthesia Pre-Procedure Evaluation
 This is a 68 y.o. female scheduled for Cystoscopy stone removal with uretheral stent exhange (Left)CYSTOURETHROSCOPY, W/INSERTION, INDWELLING URETERAL STENT (Left: Ureter)CYSTO/URETERO W/LITHOTRIPSY &INDWELL STENT INSRT (Left).Review of Systems/ Medical History Patient summary, nursing notes, pre-procedure vitals, height, weight and NPO status reviewed.No previous anesthesia concernsAnesthesia Evaluation:   No history of anesthetic complications  Estimated body mass index.02/25/24 : 19.51 kg/m? Last patient weight recorded. 02/25/24 : 61.7 kg Last patient height recorded. 02/25/24 : 5' 10 (1.778 m) Cardiovascular: Negative      -Exercise tolerance: >4 METS -Vascular Disease:  Negative    Respiratory:  Negative.HEENT: Negative.Neuromuscular: NegativeSkeletal/Skin:  Negative-Joint and Skeletal Disorders: arthritisGastrointestinal/Genitourinary: Gastrointestinal Disorders:  Patient has diverticulitis. Patient has GERD. Her GERD is well controlled.Nutritional Disorders: Pt is cachectic per BMI definition, (BMI <20).Renal Disorders:  Patient has renal calculi.Hematological/Lymphatic: -Anemia: Patient has anemia.  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 Anesthesia informed consent obtained. Consent obtained from: patientUse of blood products: consented  The post operative pain plan is per surgeon management.Plan discussed with CRNA.Anesthesiologist's Pre Op NoteI personally evaluated and examined the patient prior to the intra-operative phase of care on the day of the procedure.Marland Kitchen

## 2024-02-25 NOTE — Other
 Operative Diagnosis:Pre-op:   Ureteral stricture, left [N13.5] Patient Coded Diagnosis   Pre-op diagnosis: Ureteral stricture, left  Post-op diagnosis: Ureteral stricture, left  Patient Diagnosis   Pre-op diagnosis: Ureteral stricture, left [N13.5]  Post-op diagnosis: Ureteral stricture, left [478295]    Post-op diagnosis:   * Ureteral stricture, left [A21.5]Operative Procedure(s) :Procedure(s) (LRB):Cystoscopy with uretheral stent exhange (Left)CYSTOURETHROSCOPY, W/INSERTION, INDWELLING URETERAL STENT (Left)Post-op Procedure & Diagnosis ConfirmationPost-op Diagnosis: Post-op Diagnosis confirmed (no changes)Post-op Procedure: Post-op Procedure confirmed (no changes)

## 2024-02-25 NOTE — Other
 1520 Pt order is in for discharge, VSS, PIV removed and PIV site clean dry, pt tolerating PO intake, pt voided, discharge instructions reviewed with patient and expressed understanding, Pt to front desk via wheelchair to meet Dominic who will be driving patient back to [facility]. Patient sister Gwenlyn Found was called by RN but sister did not answer phone call.[facility] called 2x but no one answered and could not get in touch with anyone there. Discharge paper work was given to patient.

## 2024-02-25 NOTE — Other
 1152-Received patient from OR via stretcher escorted by Dr.Marousek,awake,alert,denies any pain.vitals per flow sheet,ivf infusing,safety maintained.1209-sleeping,vitals stable.1224-AAOx4,pulse is 45-50,sinus brady,asymptomatic,bp is stable,denies any pain/nausea.1239-sleeping,comfortable.1255-AAOx4,vitals stable,sinus brady@45 -50,asymptomatic,denies any pain,pacu care completed,handoff given to [facility] RN and transferred to asu.Bedside handoff done with [facility].

## 2024-02-25 NOTE — Brief Op Note
 [facility] HealthPatient Name: GWENITH TSCHIDA        [id number] Patient DOB: January 20, 1956     Surgery Date: 3/6/2025Surgeons and Role:   * [name], MD - PrimaryAssistant(s):* No surgical staff found *Staff:  Circulator: Pilar Jarvis, RN; Verlin Grills, RNRadiology Technologist: Gaynell Face, ChristopherScrub Person: Freida Busman, JenniferPre-Op Diagnosis: Ureteral stricture, left [N13.5] Procedure(s) and Anesthesia Type:   * Cystoscopy with uretheral stent exhange - GENERAL   * CYSTOURETHROSCOPY, W/INSERTION, INDWELLING URETERAL STENT - GENERALOperative Findings (enter relevant operative findings; do not refer to an operative report that is not yet transcribed): ureterscopy stent replacementSigns of infection present at the time of surgery at the operative site: None Blood and Blood Products: none                 Drains:  noneImplants: Implant Name Type Inv. Item Serial No. Manufacturer Lot No. LRB No. Used Action STENT CONTOUR 90F X 26CM - [id number] Stent, Urological STENT CONTOUR 90F X 26CM  [employer] [id number] Left 1 Explanted STENT CONTOUR 90F X 26CM - [id number] Stent, Urological STENT CONTOUR 90F X 26CM  [employer] [id number] Left 1 Replacement Device  Specimens: ID Type Source Tests Collected by Time 1 : Explanted Left Ureteral Stent Gross Only Treatment Device SURGICAL PATHOLOGY ([facility]) [name], MD 02/25/2024 11:37 AM  Intra-op Labs (16h ago, onward)   Start     Ordered  02/25/24 1135  Urine culture  [[id number]]  Once      Question Answer Comment Site: N/A  Collection Method: Sterile cystoscopy Release to patient Immediate    02/25/24 1135    Clinical Staging: EBL: None      Post Operative Diagnosis: Ureteral stricture, left [[id number]] [name], MD3/6/202511:55 AM

## 2024-02-25 NOTE — Anesthesia Post-Procedure Evaluation
 Anesthesia Post-op NotePatient: Danielle A MaloneProcedure(s):  Procedure(s) (LRB):Cystoscopy with uretheral stent exhange (Left)CYSTOURETHROSCOPY, W/INSERTION, INDWELLING URETERAL STENT (Left) Last Vitals:  I have reviewed the post-operative vital signs as noted in the Epic chart.POSTOP EVALUATION:      Patient Recovery Location:  Phase II     Vital Signs Status:  Stable     Patient Participation:  Patient participated     Mental Status:  Awake     Respiratory Status:  Acceptable     Airway Patency:  Patent     Cardiovascular/Hydration Status:  Stable     Pain Management:  Satisfactory to patient     Nausea/Vomiting Status:  Satisfactory to patientThere were no known notable events for this encounter.

## 2024-02-28 LAB — URINE CULTURE: BKR URINE CULTURE, ROUTINE: >=100000 — AB

## 2024-02-28 NOTE — Other
 [facility] REPORTPat Name:  Danielle [name], Danielle [name] No:  [id number] D.O.B.:  March 16, 1957Dict MD:  [name], M.D.Attend MD:  Cordie Grice MD:  Audrie Lia DT:  03/06/2025Disch DT:   CSN:  [id number] Pat Locat:  [facility] PERIOPGH [id number] DATE:  3/6/2025SURGEON:  [name], M.D.PREOPERATIVE DIAGNOSES:Left distal ureteral calculi, ureteral stricture.POSTOPERATIVE DIAGNOSIS:No evidence of ureteral calculi, moderate left *.PROCEDURE:Cystoscopy, stent replacement, ureteroscopy, and retrograde pyelogram.INDICATIONS:A 68 year old female with numerous kidney stones many months ago, developed an active mid ureteral stone, been managed with ureteroscopy and stent insertions.  Most recently, [place] showed those stones in the ureters and some bilateral renal stones.  She had a stent placed in October of this year.PROCEDURE IN DETAIL:The patient was brought to the operating room. Time-out was done.  The patient was on antibiotics, received IV Ancef.  At this point, cystourethroscopy was performed.  The stent was 5 months in bladder with minimal calcification.  It was grasped and [facility] sensor wire was introduced.  Urine culture was sent.  Retrograde pyelogram was performed in the following fashion.  10 cc of 50% Omnipaque contrast was removed and put in the syringe.  Retrograde pyelogram was done gently to evaluate the anatomy of the mid ureter. At this point, fluoroscopy images interpretation showed the stricture was minimal at best.  There was no hydronephrosis patent and no extravasation. That was the retrograde pyelogram.  At that point, the ureteroscope was advanced into the distal ureter.  There was no stone in the mid ureter.  There was some extra folding and kinking that required a 2nd wire to be advanced through the scope to pass the stricture.  For the very first time, the ureteroscope 7.2 was advanced past this point, previously could not be passed.  The ureter was evaluated proximally.  No stones, filling defects, or tumor.  There was some hyperplastic polyp seen consistent with previous information.  At this point, the retrograde again was done using 5 cc of Omnipaque contrast diluted gently injected into the renal pelvis to pacify the system.  A 7 x 26 stent was placed in the usual fashion, long-acting stent.Job #:  [id number] D:  02/25/2024 12:00 T:  02/25/2024 10:[id number] MEDQ  **Information in this report should not be considered final until authenticated by the author and should not be re-disclosed.**

## 2024-02-29 ENCOUNTER — Telehealth: Admit: 2024-02-29 | Payer: PRIVATE HEALTH INSURANCE | Attending: Urology | Primary: Nephrology

## 2024-02-29 NOTE — Telephone Encounter
 Dr Hulen Luster is this appt with you?  DC stent

## 2024-02-29 NOTE — Telephone Encounter
-----   Message from [name], MD sent at 02/25/2024  1:28 PM EST -----April 21 dc dc stent

## 2024-02-29 NOTE — Telephone Encounter
 This pt is scheduled with md -  doesn't this has to be schedule with nurse?

## 2024-03-18 ENCOUNTER — Other Ambulatory Visit: Admit: 2024-03-18 | Attending: Family Medicine | Primary: Nephrology

## 2024-03-18 DIAGNOSIS — G629 Polyneuropathy, unspecified: Secondary | ICD-10-CM

## 2024-03-18 DIAGNOSIS — N10 Acute tubulo-interstitial nephritis: Secondary | ICD-10-CM

## 2024-03-18 DIAGNOSIS — D649 Anemia, unspecified: Secondary | ICD-10-CM

## 2024-03-18 LAB — CBC WITH AUTO DIFFERENTIAL
BKR WAM ABSOLUTE IMMATURE GRANULOCYTES.: 0.01 x 1000/ÂµL (ref 0.00–0.30)
BKR WAM ABSOLUTE LYMPHOCYTE COUNT.: 1.16 x 1000/ÂµL (ref 0.60–3.70)
BKR WAM ABSOLUTE NRBC (2 DEC): 0 x 1000/ÂµL (ref 0.00–1.00)
BKR WAM ANC (ABSOLUTE NEUTROPHIL COUNT): 2.73 x 1000/ÂµL (ref 2.00–7.60)
BKR WAM BASOPHIL ABSOLUTE COUNT.: 0.04 x 1000/ÂµL (ref 0.00–1.00)
BKR WAM BASOPHILS: 0.8 % (ref 0.0–1.4)
BKR WAM EOSINOPHIL ABSOLUTE COUNT.: 0.29 x 1000/ÂµL (ref 0.00–1.00)
BKR WAM EOSINOPHILS: 6 % — ABNORMAL HIGH (ref 0.0–5.0)
BKR WAM HEMATOCRIT (2 DEC): 28 % — ABNORMAL LOW (ref 35.00–45.00)
BKR WAM HEMOGLOBIN: 8.7 g/dL — ABNORMAL LOW (ref 11.7–15.5)
BKR WAM IMMATURE GRANULOCYTES: 0.2 % (ref 0.0–1.0)
BKR WAM LYMPHOCYTES: 23.9 % (ref 17.0–50.0)
BKR WAM MCH (PG): 28.9 pg (ref 27.0–33.0)
BKR WAM MCHC: 31.1 g/dL (ref 31.0–36.0)
BKR WAM MCV: 93 fL (ref 80.0–100.0)
BKR WAM MONOCYTE ABSOLUTE COUNT.: 0.62 x 1000/ÂµL (ref 0.00–1.00)
BKR WAM MONOCYTES: 12.8 % — ABNORMAL HIGH (ref 4.0–12.0)
BKR WAM MPV: 10.4 fL (ref 8.0–12.0)
BKR WAM NEUTROPHILS: 56.3 % (ref 39.0–72.0)
BKR WAM NUCLEATED RED BLOOD CELLS: 0 % (ref 0.0–1.0)
BKR WAM PLATELETS: 319 x1000/ÂµL (ref 150–420)
BKR WAM RDW-CV: 15.8 % — ABNORMAL HIGH (ref 11.0–15.0)
BKR WAM RED BLOOD CELL COUNT.: 3.01 M/ÂµL — ABNORMAL LOW (ref 4.00–6.00)
BKR WAM WHITE BLOOD CELL COUNT: 4.9 x1000/ÂµL (ref 4.0–11.0)

## 2024-03-21 ENCOUNTER — Encounter: Admit: 2024-03-21 | Payer: PRIVATE HEALTH INSURANCE | Primary: Nephrology

## 2024-04-13 ENCOUNTER — Telehealth: Admit: 2024-04-13 | Payer: PRIVATE HEALTH INSURANCE | Attending: Urology | Primary: Nephrology

## 2024-04-13 ENCOUNTER — Ambulatory Visit: Admit: 2024-04-13 | Payer: PRIVATE HEALTH INSURANCE | Attending: Urology | Primary: Nephrology

## 2024-04-13 DIAGNOSIS — N201 Calculus of ureter: Secondary | ICD-10-CM

## 2024-04-13 DIAGNOSIS — N399 Disorder of urinary system, unspecified: Secondary | ICD-10-CM

## 2024-04-13 DIAGNOSIS — N135 Crossing vessel and stricture of ureter without hydronephrosis: Secondary | ICD-10-CM

## 2024-04-13 MED ORDER — FOSFOMYCIN TROMETHAMINE 3 GRAM ORAL PACKET
3 | Freq: Once | ORAL | Status: AC
Start: 2024-04-13 — End: ?

## 2024-04-13 MED ORDER — LIDOCAINE 2 % MUCOSAL JELLY IN APPLICATOR
2 | Freq: Once | URETHRAL | Status: AC
Start: 2024-04-13 — End: ?

## 2024-04-13 NOTE — Telephone Encounter
 Called Danielle [name] [facility] to find out if pt is planning to come, after multiple attempts and transfers, was able to left a message to please have a call returned, asked for a call back

## 2024-04-15 ENCOUNTER — Ambulatory Visit: Admit: 2024-04-15 | Payer: Medicare (Managed Care) | Attending: Urology | Primary: Nephrology

## 2024-04-15 ENCOUNTER — Encounter: Admit: 2024-04-15 | Payer: PRIVATE HEALTH INSURANCE | Attending: Urology | Primary: Nephrology

## 2024-04-15 ENCOUNTER — Telehealth: Admit: 2024-04-15 | Payer: PRIVATE HEALTH INSURANCE | Attending: Urology | Primary: Nephrology

## 2024-04-15 VITALS — BP 125/75 | HR 73 | Temp 98.00000°F | Resp 16 | Ht 70.0 in | Wt 136.0 lb

## 2024-04-15 DIAGNOSIS — K219 Gastro-esophageal reflux disease without esophagitis: Secondary | ICD-10-CM

## 2024-04-15 DIAGNOSIS — M069 Rheumatoid arthritis, unspecified: Secondary | ICD-10-CM

## 2024-04-15 DIAGNOSIS — R627 Adult failure to thrive: Secondary | ICD-10-CM

## 2024-04-15 DIAGNOSIS — L03115 Cellulitis of right lower limb: Secondary | ICD-10-CM

## 2024-04-15 DIAGNOSIS — A499 Bacterial infection, unspecified: Secondary | ICD-10-CM

## 2024-04-15 DIAGNOSIS — K5792 Diverticulitis of intestine, part unspecified, without perforation or abscess without bleeding: Secondary | ICD-10-CM

## 2024-04-15 DIAGNOSIS — R32 Unspecified urinary incontinence: Secondary | ICD-10-CM

## 2024-04-15 DIAGNOSIS — D649 Anemia, unspecified: Secondary | ICD-10-CM

## 2024-04-15 DIAGNOSIS — R1310 Dysphagia, unspecified: Secondary | ICD-10-CM

## 2024-04-15 DIAGNOSIS — Z932 Ileostomy status: Secondary | ICD-10-CM

## 2024-04-15 DIAGNOSIS — G9340 Encephalopathy, unspecified: Secondary | ICD-10-CM

## 2024-04-15 DIAGNOSIS — N135 Crossing vessel and stricture of ureter without hydronephrosis: Secondary | ICD-10-CM

## 2024-04-15 DIAGNOSIS — G8220 Paraplegia, unspecified: Secondary | ICD-10-CM

## 2024-04-15 DIAGNOSIS — N200 Calculus of kidney: Secondary | ICD-10-CM

## 2024-04-15 DIAGNOSIS — N399 Disorder of urinary system, unspecified: Secondary | ICD-10-CM

## 2024-04-15 DIAGNOSIS — N201 Calculus of ureter: Secondary | ICD-10-CM

## 2024-04-15 DIAGNOSIS — Z433 Encounter for attention to colostomy: Secondary | ICD-10-CM

## 2024-04-15 DIAGNOSIS — I82419 Acute embolism and thrombosis of unspecified femoral vein: Secondary | ICD-10-CM

## 2024-04-15 DIAGNOSIS — E099 Drug or chemical induced diabetes mellitus without complications: Secondary | ICD-10-CM

## 2024-04-15 LAB — URINE MICROSCOPIC     (BH GH LMW YH)
BKR RBC/HPF INSTRUMENT: 963 /HPF — ABNORMAL HIGH (ref 0–2)
BKR URINE SQUAMOUS EPITHELIAL CELLS, UA (NUMERIC): 3 /HPF (ref 0–5)
BKR WBC/HPF INSTRUMENT: 2386 /HPF — ABNORMAL HIGH (ref 0–5)

## 2024-04-15 LAB — URINALYSIS-MACROSCOPIC W/REFLEX MICROSCOPIC
BKR BILIRUBIN, UA: NEGATIVE
BKR GLUCOSE, UA: NEGATIVE
BKR KETONES, UA: NEGATIVE
BKR NITRITE, UA: NEGATIVE
BKR PH, UA: 6.5 (ref 5.5–7.5)
BKR SPECIFIC GRAVITY, UA: 1.014 (ref 1.005–1.030)
BKR UROBILINOGEN, UA (MG/DL): <2 mg/dL (ref <=2.0)

## 2024-04-15 MED ORDER — LIDOCAINE 2 % MUCOSAL JELLY IN APPLICATOR
2 | Freq: Once | URETHRAL | Status: CP
Start: 2024-04-15 — End: ?
  Administered 2024-04-15: 14:00:00 2 mL via URETHRAL

## 2024-04-15 MED ORDER — FOSFOMYCIN TROMETHAMINE 3 GRAM ORAL PACKET
3 | Freq: Once | ORAL | Status: CP
Start: 2024-04-15 — End: ?
  Administered 2024-04-15: 14:00:00 3 gram via ORAL

## 2024-04-15 NOTE — Telephone Encounter
-  Rose (nurse) from [facility] will call back. Please make the following appointments.  -Return for 3 week [place] scan (Radiology [phone]) -ov 4 weeks.

## 2024-04-15 NOTE — Patient Instructions
 Cystoscopy, Care AfterRefer to this sheet in the next few weeks. These instructions provide you with information about caring for yourself after your procedure. Your health care provider may also give you more specific instructions. Your treatment has been planned according to current medical practices, but problems sometimes occur. Call your health care provider if you have any problems or questions after your procedure.What can I expect after the procedure?After the procedure, it is common to have:Mild pain when you urinate. Pain should stop within a few minutes after you urinate. This may last for up to 1 week.A small amount of blood in your urine for several days.Feeling like you need to urinate but producing only a small amount of urine.Follow these instructions at home:MedicinesTake over-the-counter and prescription medicines only as told by your health care provider.If you were prescribed an antibiotic medicine, take it as told by your health care provider. Do not stop taking the antibiotic even if you start to feel better.General instructionsReturn to your normal activities as told by your health care provider. Ask your health care provider what activities are safe for you.Do not drive for 24 hours if you received a sedative.Watch for any blood in your urine. If the amount of blood in your urine increases, call your health care provider.Follow instructions from your health care provider about eating or drinking restrictions.If a tissue sample was removed for testing (biopsy) during your procedure, it is your responsibility to get your test results. Ask your health care provider or the department performing the test when your results will be ready.Drink enough fluid to keep your urine clear or pale yellow.Keep all follow-up visits as told by your health care provider. This is important.Contact a health care provider if:You have pain that gets worse or does not get better with medicine, especially pain when you urinate.You have difficulty urinating.Get help right away if:You have more blood in your urine.You have blood clots in your urine.You have abdominal pain.You have a fever or chills.You are unable to urinate.This information is not intended to replace advice given to you by your health care provider. Make sure you discuss any questions you have with your health care provider.Document Released: 06/27/2005 Document Revised: 05/15/2016 Document Reviewed: 11/03/2016Elsevier Interactive Patient Education ? 2019 Elsevier Inc.

## 2024-04-15 NOTE — Telephone Encounter
 Needs to be scheduled per ZO:XWRUEA for  3 week [place]/ ov 4 weeks. [place] in 2-3 weeks, ov in 4 weeks

## 2024-04-15 NOTE — Progress Notes
 Danielle [name] is here today for a cystoscopy and stent removal.  Patient informed of procedure and questions answered.  Instructions for pre procedure given to patient.  Written consent and time out obtained.  Patient prepped according to protocol.  Laterality of stent confirmed on the left side.  Tolerated procedure well.  VSS.  Post procedure instructions reviewed with patient. Fosfomycin adminsitered per protocolWill have [place] scan 2-3 weeks, then ov in 4 weeks per MD

## 2024-04-15 NOTE — Progress Notes
 Time Out DocumentationProcedure Name: Cytoscopy/stent removalProcedure Time: 2:25 PM Team Members Present: Juan Noel, RN, Laine Piggs, MDTime Out initiated after patient positioned & prior to beginning of procedure: YesName of Patient  & MRUN # or DOB stated and matches ID band or previously confirmed med record number: YesProceduralist states or confirms procedure to be performed: YesProcedural consent is used to verify procedure performed  & matches pt identifiers:YesLaterality of stent confirmed & matches Implant record:  Yes on the right sideNumber of stents removed & matches Implant record: 1 Site of procedure(s) (with laterality or level) is topically marked per policy & visible after draping:N/A Final check completed on all specimens: not applicableSerial number of device: [id number]

## 2024-04-15 NOTE — Progress Notes
 Sp urs stent with stricture stent removal hx of stones   Danielle [name] is a 68 y.o. female  status post bilateral stent placement for urosepsisPatient had passed a right ureteral stone has not asymptomatic left ureteral stonePatient more alert todayUrine blood-tinged07/07/2023 patient looks reasonably well with a clear understanding what is going onPatient with Klebsiella and urine of presses urine blood-tinged improving\WBC down to 7.9 from 22 creatinine 0.757/11/2023 sp bilateral stent passed stone r uretr has left midOn ceftin  sepsis PAR for urs on left remove stent rightUa wbc  09/15/2023 patient is status post bilateral ureteral obstruction urgent bilateral stent placedPatient did have right ureteral stone Past had a difficult ureteroscopy on the left side secondary to 4 mm stone in the mid proximal ureterCurrently has stent placed since middle of JulyAdmitted for fever sepsisHopefully during this hospitalization will require ureteroscopy probably next week and recommendations to follow10/01/2023 schedle cysto urs in am PAR all discussed10/03/2023 sp urs with stone extraction Pt no issues but wbc 46!!No fever low BPLytes normalCt some mild fluid and mild hydro stent good position fluid around kidney11/05/2023 lengthy discussion concerning with the patient and conservativelyPatient with a mid ureteral stricture complicated with ureteral calculiHas had 2 ureteroscopy is both very difficult suspect patient has significant stricture in the mid ureterAll options were discussed with the patient and family including removing the stent in evaluatedMay we may require percutaneous and reconstructionOr we can change the stent every 3-6 months without issueCystoscopy to see calcifications after 1 month todayCystoscopy showed minimal calcification after 1 month 02/01/2019 5 patient with dense ureteral stricture managed with stent exchangesLast stent exchange was October 2024Here for us  evaluation of distal stent to see if this calcificationWill need stent exchangePatient 4 months out with partial lower calcifications stent54/25/2025 hx of diff me ureteral stricture stone last URS all clear poss soft area 1 year managed with stent etcDc stent removalStones on urocitk b ilateralAlso colostomy wants reversalHPI Past Medical HistoryPast Medical History    Past Medical History: Diagnosis Date  Acute embolism and thrombosis of unspecified femoral vein (HC Code)    Anemia    Cellulitis and abscess of right lower extremity    Dysphagia    Encephalopathy    GERD (gastroesophageal reflux disease)    Hypercalcemia    Paraplegia (HC Code)    Rheumatoid arthritis (HC Code)     Past Surgical HistoryPast Surgical History     Past Surgical History: Procedure Laterality Date  BAND HEMORRHOIDECTOMY      CYSTECTOMY   1972  LAPAROSCOPIC SALPINGOOPHERECTOMY   1986  LAPAROTOMY   2022  PARTIAL COLECTOMY   2022  PEG TUBE PLACEMENT   01/2022  UPPER GASTROINTESTINAL ENDOSCOPY       AllergiesNo Known [email or web address] Social HistorySocial History     Tobacco Use  Smoking status: Never  Smokeless tobacco: Never  Family History No family history on file. Review of SystemsNo symptoms beyond those noted in HPI and PMH- all other systems are negative Physical ExamBP 103/66  - Pulse 78  - Temp 99.8 ?F (37.7 ?C) (Temporal)  - Resp 20  - Ht 5' 10 (1.778 m)  - Wt 65.6 kg  - SpO2 95%  - BMI 20.75 kg/m?      Lab Results Component Value Date   WBC 7.9 06/29/2023   HGB 9.1 (L) 06/29/2023   HCT 28.80 (L) 06/29/2023   PLT 176 06/29/2023   ALT 8 (L) 06/29/2023   AST 9 06/29/2023   NA 144 06/29/2023  K 3.2 (L) 06/29/2023   CL 116 (H) 06/29/2023   CREATININE 0.75 06/29/2023   BUN 9 06/29/2023   CO2 23 06/29/2023 TSH 0.527 03/09/2023   INR 1.08 06/28/2023   GLU 88 06/29/2023   HGBA1C 5.5 03/09/2023   No results found for: UCOLOR, UGLUCOSE, UKETONE, USPECGRAVITY, UPH, UPROTEIN, UNITRATES, UBLOOD, ULEUKOCYTES, UA  No results found for: PVRPOCT     Previous resultsNo results found for: TESTOSTERONE  AUA Symptom Score today:       No data to display      04/15/2024  Ardell A Tasker is here today for a cystoscopy and stent removal.  Patient informed of procedure and questions answered.  Instructions for pre procedure given to patient.  Written consent and time out obtained.  Patient prepped according to protocol.  Laterality of stent confirmed on the left side.  Tolerated procedure well.  VSS.  Post procedure instructions reviewed with patient.  discussed risks of sepsis stricture    Patient with multiple issues left renal stone left ureteral stricture lengthy discussion with family    Assessment & Plan: GENAVEE FIREBAUGH is a 68 y.o  left ureteral stricture Fosofo x3 dosages saty on hipprexCT 2 weeks ro hydro ER if fever etc Dr Parris Bolognese colon revisiomn

## 2024-04-18 ENCOUNTER — Telehealth: Admit: 2024-04-18 | Payer: PRIVATE HEALTH INSURANCE | Attending: Urology | Primary: Nephrology

## 2024-04-18 ENCOUNTER — Encounter: Admit: 2024-04-18 | Payer: PRIVATE HEALTH INSURANCE | Attending: Urology | Primary: Nephrology

## 2024-04-18 ENCOUNTER — Other Ambulatory Visit: Admit: 2024-04-18 | Payer: PRIVATE HEALTH INSURANCE | Attending: Nephrology | Primary: Nephrology

## 2024-04-18 DIAGNOSIS — N201 Calculus of ureter: Secondary | ICD-10-CM

## 2024-04-18 DIAGNOSIS — R627 Adult failure to thrive: Secondary | ICD-10-CM

## 2024-04-18 DIAGNOSIS — G629 Polyneuropathy, unspecified: Secondary | ICD-10-CM

## 2024-04-18 DIAGNOSIS — N135 Crossing vessel and stricture of ureter without hydronephrosis: Secondary | ICD-10-CM

## 2024-04-18 DIAGNOSIS — N399 Disorder of urinary system, unspecified: Secondary | ICD-10-CM

## 2024-04-18 DIAGNOSIS — A4189 Other specified sepsis: Secondary | ICD-10-CM

## 2024-04-18 LAB — CBC WITH AUTO DIFFERENTIAL
BKR WAM ABSOLUTE IMMATURE GRANULOCYTES.: 0 x 1000/ÂµL (ref 0.00–0.30)
BKR WAM ABSOLUTE LYMPHOCYTE COUNT.: 1.36 x 1000/ÂµL (ref 0.60–3.70)
BKR WAM ABSOLUTE NRBC (2 DEC): 0 x 1000/ÂµL (ref 0.00–1.00)
BKR WAM ANC (ABSOLUTE NEUTROPHIL COUNT): 1.56 x 1000/ÂµL — ABNORMAL LOW (ref 2.00–7.60)
BKR WAM BASOPHIL ABSOLUTE COUNT.: 0.05 x 1000/ÂµL (ref 0.00–1.00)
BKR WAM BASOPHILS: 1.3 % (ref 0.0–1.4)
BKR WAM EOSINOPHIL ABSOLUTE COUNT.: 0.29 x 1000/ÂµL (ref 0.00–1.00)
BKR WAM EOSINOPHILS: 7.7 % — ABNORMAL HIGH (ref 0.0–5.0)
BKR WAM HEMATOCRIT (2 DEC): 30.9 % — ABNORMAL LOW (ref 35.00–45.00)
BKR WAM HEMOGLOBIN: 9.4 g/dL — ABNORMAL LOW (ref 11.7–15.5)
BKR WAM IMMATURE GRANULOCYTES: 0 % (ref 0.0–1.0)
BKR WAM LYMPHOCYTES: 36.2 % (ref 17.0–50.0)
BKR WAM MCH (PG): 28.5 pg (ref 27.0–33.0)
BKR WAM MCHC: 30.4 g/dL — ABNORMAL LOW (ref 31.0–36.0)
BKR WAM MCV: 93.6 fL (ref 80.0–100.0)
BKR WAM MONOCYTE ABSOLUTE COUNT.: 0.5 x 1000/ÂµL (ref 0.00–1.00)
BKR WAM MONOCYTES: 13.3 % — ABNORMAL HIGH (ref 4.0–12.0)
BKR WAM MPV: 10.1 fL (ref 8.0–12.0)
BKR WAM NEUTROPHILS: 41.5 % (ref 39.0–72.0)
BKR WAM NUCLEATED RED BLOOD CELLS: 0 % (ref 0.0–1.0)
BKR WAM PLATELETS: 285 x1000/ÂµL (ref 150–420)
BKR WAM RDW-CV: 14.6 % (ref 11.0–15.0)
BKR WAM RED BLOOD CELL COUNT.: 3.3 M/ÂµL — ABNORMAL LOW (ref 4.00–6.00)
BKR WAM WHITE BLOOD CELL COUNT: 3.8 x1000/ÂµL — ABNORMAL LOW (ref 4.0–11.0)

## 2024-04-18 LAB — URINE CULTURE: BKR URINE CULTURE, ROUTINE: >=100000 — AB

## 2024-04-18 MED ORDER — LEVOFLOXACIN 500 MG TABLET
500 | ORAL_TABLET | Freq: Every day | ORAL | 1 refills | 30.00000 days | Status: AC
Start: 2024-04-18 — End: ?

## 2024-04-18 NOTE — Telephone Encounter
 Called Danielle [name], asked for 2nd floor nurse, no answer, asked for fax number [phone] fax with information, Levaquin 500 mg 10 days has been sent to pharmacyAsked them to schedule [place] scan in 2 weeks and OV with MD in 4 weeks, Scanned fax into chart

## 2024-04-18 NOTE — Telephone Encounter
 Sched

## 2024-04-18 NOTE — Telephone Encounter
 Order pended and signed, sent fax to nathaniel [facility]all since no one answered, Scanned into chart

## 2024-04-18 NOTE — Telephone Encounter
-----   Message from Laine Piggs, MD sent at 04/17/2024  9:19 AM EDT -----Not sure but needs treatnebt whensensitivties we gave her fososPen levaquin 500 daily 10 days

## 2024-04-18 NOTE — Telephone Encounter
 Yes

## 2024-04-19 ENCOUNTER — Ambulatory Visit: Admit: 2024-04-19 | Payer: PRIVATE HEALTH INSURANCE | Attending: Medical Oncology | Primary: Nephrology

## 2024-04-19 ENCOUNTER — Encounter: Admit: 2024-04-19 | Payer: PRIVATE HEALTH INSURANCE | Attending: Medical Oncology | Primary: Nephrology

## 2024-04-19 ENCOUNTER — Inpatient Hospital Stay: Admit: 2024-04-19 | Discharge: 2024-04-19 | Payer: PRIVATE HEALTH INSURANCE | Primary: Nephrology

## 2024-04-19 ENCOUNTER — Telehealth: Admit: 2024-04-19 | Payer: PRIVATE HEALTH INSURANCE | Attending: Surgical | Primary: Nephrology

## 2024-04-19 VITALS — BP 112/72 | HR 62 | Temp 97.50000°F | Resp 18 | Ht 70.0 in | Wt 139.0 lb

## 2024-04-19 DIAGNOSIS — E099 Drug or chemical induced diabetes mellitus without complications: Secondary | ICD-10-CM

## 2024-04-19 DIAGNOSIS — L03115 Cellulitis of right lower limb: Secondary | ICD-10-CM

## 2024-04-19 DIAGNOSIS — R627 Adult failure to thrive: Secondary | ICD-10-CM

## 2024-04-19 DIAGNOSIS — Z7901 Long term (current) use of anticoagulants: Secondary | ICD-10-CM

## 2024-04-19 DIAGNOSIS — M069 Rheumatoid arthritis, unspecified: Secondary | ICD-10-CM

## 2024-04-19 DIAGNOSIS — Z932 Ileostomy status: Secondary | ICD-10-CM

## 2024-04-19 DIAGNOSIS — G8220 Paraplegia, unspecified: Secondary | ICD-10-CM

## 2024-04-19 DIAGNOSIS — A499 Bacterial infection, unspecified: Secondary | ICD-10-CM

## 2024-04-19 DIAGNOSIS — K219 Gastro-esophageal reflux disease without esophagitis: Secondary | ICD-10-CM

## 2024-04-19 DIAGNOSIS — R1310 Dysphagia, unspecified: Secondary | ICD-10-CM

## 2024-04-19 DIAGNOSIS — D508 Other iron deficiency anemias: Secondary | ICD-10-CM

## 2024-04-19 DIAGNOSIS — N200 Calculus of kidney: Secondary | ICD-10-CM

## 2024-04-19 DIAGNOSIS — R32 Unspecified urinary incontinence: Secondary | ICD-10-CM

## 2024-04-19 DIAGNOSIS — I82419 Acute embolism and thrombosis of unspecified femoral vein: Secondary | ICD-10-CM

## 2024-04-19 DIAGNOSIS — D649 Anemia, unspecified: Secondary | ICD-10-CM

## 2024-04-19 DIAGNOSIS — K5792 Diverticulitis of intestine, part unspecified, without perforation or abscess without bleeding: Secondary | ICD-10-CM

## 2024-04-19 DIAGNOSIS — G9340 Encephalopathy, unspecified: Secondary | ICD-10-CM

## 2024-04-19 DIAGNOSIS — N390 Urinary tract infection, site not specified: Secondary | ICD-10-CM

## 2024-04-19 LAB — COMPREHENSIVE METABOLIC PANEL
BKR A/G RATIO: 0.8
BKR ALANINE AMINOTRANSFERASE (ALT): 12 U/L (ref 12–78)
BKR ALBUMIN: 3.6 g/dL (ref 3.4–5.0)
BKR ALKALINE PHOSPHATASE: 106 U/L (ref 20–120)
BKR ANION GAP: 6 (ref 5–18)
BKR ASPARTATE AMINOTRANSFERASE (AST): 12 U/L (ref 5–37)
BKR AST/ALT RATIO: 1
BKR BILIRUBIN TOTAL: 0.3 mg/dL (ref 0.0–1.0)
BKR BLOOD UREA NITROGEN: 14 mg/dL (ref 8–25)
BKR BUN / CREAT RATIO: 13.6 (ref 8.0–25.0)
BKR CALCIUM: 10.9 mg/dL — ABNORMAL HIGH (ref 8.4–10.3)
BKR CHLORIDE: 112 mmol/L (ref 95–115)
BKR CO2: 22 mmol/L (ref 21–32)
BKR CREATININE DELTA: -0.29
BKR CREATININE: 1.03 mg/dL (ref 0.50–1.30)
BKR EGFR, CREATININE (CKD-EPI 2021): 59 mL/min/{1.73_m2} — ABNORMAL LOW (ref >=60)
BKR GLOBULIN: 4.6 g/dL
BKR GLUCOSE: 88 mg/dL (ref 70–100)
BKR OSMOLALITY CALCULATION: 279 mosm/kg (ref 275–295)
BKR POTASSIUM: 4.1 mmol/L (ref 3.5–5.1)
BKR PROTEIN TOTAL: 8.2 g/dL (ref 6.4–8.2)
BKR SODIUM: 140 mmol/L (ref 136–145)

## 2024-04-19 LAB — CBC WITHOUT DIFFERENTIAL
BKR WAM ANC (ABSOLUTE NEUTROPHIL COUNT): 1.82 x 1000/ÂµL — ABNORMAL LOW (ref 2.00–7.60)
BKR WAM HEMATOCRIT (2 DEC): 34.8 % — ABNORMAL LOW (ref 35.00–45.00)
BKR WAM HEMOGLOBIN: 10.6 g/dL — ABNORMAL LOW (ref 11.7–15.5)
BKR WAM MCH (PG): 28.4 pg (ref 27.0–33.0)
BKR WAM MCHC: 30.5 g/dL — ABNORMAL LOW (ref 31.0–36.0)
BKR WAM MCV: 93.3 fL (ref 80.0–100.0)
BKR WAM MPV: 9.9 fL (ref 8.0–12.0)
BKR WAM PLATELETS: 310 x1000/ÂµL (ref 150–420)
BKR WAM RDW-CV: 14.5 % (ref 11.0–15.0)
BKR WAM RED BLOOD CELL COUNT.: 3.73 M/ÂµL — ABNORMAL LOW (ref 4.00–6.00)
BKR WAM WHITE BLOOD CELL COUNT: 3.7 x1000/ÂµL — ABNORMAL LOW (ref 4.0–11.0)

## 2024-04-19 LAB — RETICULOCYTES
BKR WAM IRF: 11.2 % (ref 3.0–15.9)
BKR WAM RETICULOCYTE - ABS (3 DEC): 0.055 10Ë6 cells/uL (ref 0.023–0.140)
BKR WAM RETICULOCYTE COUNT PCT (1 DEC): 1.5 % (ref 0.6–2.7)
BKR WAM RETICULOCYTE HGB EQUIVALENT: 30.1 pg (ref 28.2–35.7)

## 2024-04-19 LAB — IRON AND TIBC
BKR IRON SATURATION (SCCCT BH GH LM): 9 % — ABNORMAL LOW (ref 11–46)
BKR IRON: 29 ug/dL — ABNORMAL LOW (ref 40–150)
BKR TOTAL IRON BINDING CAPACITY (SCCCT  BH GH LM): 328 ug/dL (ref 250–450)

## 2024-04-19 LAB — FERRITIN: BKR FERRITIN: 18 ng/mL (ref 8–252)

## 2024-04-19 NOTE — Telephone Encounter
 Contacted [facility] and relayed Lee's message to [employer] nurse. Maggie. A straight cath urine specimen will be obtained next Monday or Tuesday per Maggie.

## 2024-04-19 NOTE — Progress Notes
 Subjective:   Danielle [name] is a 68 y.o. female with a history of DVT and anemia secondary to blood lossHPIIn November 2022, she was admitted to a hospital in [place]  with hemoperitoneum, due to a diverticular perforation.  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  She also developed encephalopathy.  She was eventually transferred to a skilled nursing facility on 02/05/2022.  Thereafter, patient was brought to [facility] on 02/11/2022 for management of encephalopathy.  She had a six-month hospitalization.  She was discharged to another skilled nursing care facility on 08/14/2022 with instructions to follow-up with a hematologist about the duration of anticoagulation therapy. She was then admitted to [facility] in March of 2024 again with altered mental status secondary to sepsis.  She was treated for UTI/sepsis and discharged back to skilled nursing care facility within a week. On 05/24/2023, she presented to [facility] with blood from the stoma.Doppler ultrasound on 05/24/2023 showed that she have a new partially occlusive thrombus in the right popliteal vein. Blood test on 05/24/2023 was consistent with iron  deficiencyHemoglobin was reduced at 8.7She received 1 g of iron  dextran on 06/03/2024At the time of her initial hematologic consultation on 05/26/2023, I felt that she was at an increased risk of future thrombus and I recommended that she remain on the anticoagulant therapy indefinitely - until her condition changes.  INTERIM HISTORY:In March, she had a ureteral stent replacementAfter the procedure, the hemoglobin declinedShe remains on EliquisShe denied signs of blood lossShe is on iron  daily - but not on vitamin-CShe has not had recurrent urinary tract infections since the last visit 3 months agoPAST MEDICAL HISTORY:Rheumatoid arthritisDiverticular perforation with abscess-November 2022Recurrent sepsisAnemia/iron  deficiencyMEDICATIONS:Current Outpatient Medications Medication Sig  acetaminophen  (TYLENOL ) 325 mg tablet Take 3 tablets (975 mg total) by mouth every 8 (eight) hours as needed for pain.  apixaban  (ELIQUIS ) 5 mg tablet Take 1 tablet (5 mg total) by mouth every 12 (twelve) hours.  ferrous gluconate  (FERGON) 324 mg (38 mg iron ) tablet Take 1 tablet (324 mg total) by mouth daily.  folic acid  (FOLVITE ) 1 mg tablet Take 1 tablet (1 mg total) by mouth daily.  gabapentin  (NEURONTIN ) 400 mg capsule Take 1 capsule (400 mg total) by mouth 2 (two) times daily with breakfast and dinner.  levoFLOXacin (LEVAQUIN) 500 mg tablet Take 1 tablet (500 mg total) by mouth daily for 10 days.  magnesium  hydroxide (MILK OF MAGNESIA) 400 mg/5 mL suspension Take 30 mLs by mouth daily as needed for constipation. (Patient not taking: Reported on 04/15/2024)  menthol -zinc  oxide (CALMOSEPTIN) 0.44-20.6 % ointment Apply topically every 8 (eight) hours. To Perineum, labia, buttocks  methenamine  (HIPREX ) 1 gram tablet Take 1 tablet (1 g total) by mouth 2 (two) times daily.  polyethylene glycol (MIRALAX ) 17 gram packet Take 1 packet (17 g total) by mouth daily. Mix in 8 ounces of water , juice, soda, coffee or tea prior to taking. (Patient not taking: Reported on 04/15/2024)  potassium citrate  (UROCIT-K ) 15 mEq extended release tablet Take 1 tablet (15 mEq total) by mouth daily.  rosuvastatin  (CRESTOR ) 10 mg tablet Take 1 tablet (10 mg total) by mouth every evening.  traMADoL  (ULTRAM ) 50 mg tablet Take 1 tablet (50 mg total) by mouth every 8 (eight) hours as needed for pain.  ZINC  OXIDE TOP Apply topically every 8 (eight) hours. To buttocks No current facility-administered medications for this visit. Facility-Administered Medications Ordered in Other Visits Medication  fosfomycin (MONUROL )  packet 3 g  lidocaine  uro-jet (XYLOCAINE ) 2 % jelly 11 mL SOCIAL HISTORY:Never smokedNever consumed alcohol  regularlyFAMILY HISTORY:No known family history of malignancies ALLERGIES:No Known Allergies Review of Systems  Objective:  BP 112/72 (Site: r a, Position: Sitting, Cuff Size: Medium)  - Pulse 62  - Temp 97.5 ?F (36.4 ?C) (Temporal)  - Resp 18  - Ht 5' 10 (1.778 m)  - Wt 63 kg  - SpO2 99%  - BMI 19.94 kg/m? Vitals:  04/19/24 1008 Position: Sitting Physical Exam Latest Ref Rng 01/14/2024 01/18/2024 01/19/2024 Labs     Hemoglobin 11.7 - 15.5 g/dL 9.6 (L)  16.1 (L)  09.6 (L)  Hematocrit 35.00 - 45.00 % 30.80 (L)  33.90 (L)  33.20 (L)  WBC 4.0 - 11.0 x1000/?L 6.1  8.5  5.2  Platelets 150 - 420 x1000/?L 289  347  339   Latest Ref Rng 03/18/2024 04/18/2024 04/19/2024 Labs     Hemoglobin 11.7 - 15.5 g/dL 8.7 (L)  9.4 (L)  04.5 (L)  Hematocrit 35.00 - 45.00 % 28.00 (L)  30.90 (L)  34.80 (L)  WBC 4.0 - 11.0 x1000/?L 4.9  3.8 (L)  3.7 (L)  Platelets 150 - 420 x1000/?L 319  285  310   Legend:(L) Low  Assessment / Plan:  History of DVT Questionable New right popliteal DVT-05/24/2023 Anemia due to blood loss Medical records/blood test results were reviewedHemoglobin transiently declined after the procedureIt has reverted back to her value from [place] panel is pendingI recommended that she continue with Eliquis  5 mg twice a dayI recommend that she continue with iron  gluconate 325 mg dailyI recommended that vitamin-C a 1000 mg be added with the ironShe will return for a follow-up blood test and a visit in 4 monthsPlease note: MModal speech recognition software was used to compose this note. Notes are reviewed for accuracy but unintended translational errors can occur. Please contact me if there are any questions about the content of this note.Orders Placed This Encounter Procedures  CBC without differential  Iron , TIBC and ferritin panel ([facility])  Comprehensive metabolic panel  Return Visit with Labs (peripheral draw)  Electronically Signed by Rolene Client, MD, April 19, 2024

## 2024-04-19 NOTE — Telephone Encounter
 She has a uti , resistant to levaquin. Dr Audelia Blazer is requesting that she stop her levaquin. The dose of fosfomycin that she received may have treated the uti completely. She will need a cathed urine culture next week to determine if another dose of fosfomycin is needed.

## 2024-04-21 NOTE — Telephone Encounter
  scheduled 05-09-24.

## 2024-04-22 ENCOUNTER — Telehealth: Admit: 2024-04-22 | Payer: PRIVATE HEALTH INSURANCE | Attending: Urology | Primary: Nephrology

## 2024-04-22 NOTE — Telephone Encounter
 I called Rose from pt's nursing home and Jolly Needle states that she thinks that she has made all of the necessary appointments.Please call Rose to verify that this is correct. Thank you

## 2024-04-22 NOTE — Telephone Encounter
 Please call Danielle [name] to close the circle--she would like to speak with you. Thank you.

## 2024-04-25 ENCOUNTER — Other Ambulatory Visit: Admit: 2024-04-25 | Payer: Medicare (Managed Care) | Attending: Nephrology | Primary: Nephrology

## 2024-04-25 DIAGNOSIS — N390 Urinary tract infection, site not specified: Secondary | ICD-10-CM

## 2024-04-25 LAB — URINALYSIS WITH CULTURE REFLEX      (BH LMW YH)
BKR BILIRUBIN, UA: NEGATIVE
BKR GLUCOSE, UA: NEGATIVE
BKR KETONES, UA: NEGATIVE
BKR NITRITE, UA: NEGATIVE
BKR PH, UA: 6.5 (ref 5.5–7.5)
BKR PROTEIN, UA: NEGATIVE
BKR SPECIFIC GRAVITY, UA: 1.009 (ref 1.005–1.030)
BKR UROBILINOGEN, UA (MG/DL): <2 mg/dL (ref <=2.0)

## 2024-04-25 LAB — URINE MICROSCOPIC     (BH GH LMW YH)
BKR RBC/HPF INSTRUMENT: 6 /HPF — ABNORMAL HIGH (ref 0–2)
BKR URINE SQUAMOUS EPITHELIAL CELLS, UA (NUMERIC): <1 /HPF (ref 0–5)
BKR WBC/HPF INSTRUMENT: 46 /HPF — ABNORMAL HIGH (ref 0–5)

## 2024-04-25 LAB — UA REFLEX CULTURE

## 2024-04-26 NOTE — Telephone Encounter
 Tried calling [employer], got disconnected, called back, the phone kept ringing no answer, after 10 min. Hung up.

## 2024-04-28 LAB — URINE CULTURE

## 2024-05-09 ENCOUNTER — Inpatient Hospital Stay: Admit: 2024-05-09 | Discharge: 2024-05-09 | Payer: PRIVATE HEALTH INSURANCE | Primary: Nephrology

## 2024-05-09 DIAGNOSIS — N135 Crossing vessel and stricture of ureter without hydronephrosis: Secondary | ICD-10-CM

## 2024-05-09 DIAGNOSIS — N201 Calculus of ureter: Secondary | ICD-10-CM

## 2024-05-18 ENCOUNTER — Encounter: Admit: 2024-05-18 | Payer: PRIVATE HEALTH INSURANCE | Attending: Urology | Primary: Nephrology

## 2024-05-18 ENCOUNTER — Ambulatory Visit: Admit: 2024-05-18 | Payer: PRIVATE HEALTH INSURANCE | Attending: Urology | Primary: Nephrology

## 2024-05-18 ENCOUNTER — Other Ambulatory Visit: Admit: 2024-05-18 | Payer: Medicare (Managed Care) | Attending: Nephrology | Primary: Nephrology

## 2024-05-18 VITALS — Ht 70.0 in | Wt 138.9 lb

## 2024-05-18 DIAGNOSIS — R627 Adult failure to thrive: Secondary | ICD-10-CM

## 2024-05-18 DIAGNOSIS — I82419 Acute embolism and thrombosis of unspecified femoral vein: Secondary | ICD-10-CM

## 2024-05-18 DIAGNOSIS — D649 Anemia, unspecified: Secondary | ICD-10-CM

## 2024-05-18 DIAGNOSIS — N200 Calculus of kidney: Secondary | ICD-10-CM

## 2024-05-18 DIAGNOSIS — R1310 Dysphagia, unspecified: Secondary | ICD-10-CM

## 2024-05-18 DIAGNOSIS — A499 Bacterial infection, unspecified: Secondary | ICD-10-CM

## 2024-05-18 DIAGNOSIS — Z932 Ileostomy status: Secondary | ICD-10-CM

## 2024-05-18 DIAGNOSIS — M069 Rheumatoid arthritis, unspecified: Secondary | ICD-10-CM

## 2024-05-18 DIAGNOSIS — E099 Drug or chemical induced diabetes mellitus without complications: Secondary | ICD-10-CM

## 2024-05-18 DIAGNOSIS — K9403 Colostomy malfunction: Secondary | ICD-10-CM

## 2024-05-18 DIAGNOSIS — K9413 Enterostomy malfunction: Secondary | ICD-10-CM

## 2024-05-18 DIAGNOSIS — K219 Gastro-esophageal reflux disease without esophagitis: Secondary | ICD-10-CM

## 2024-05-18 DIAGNOSIS — G9340 Encephalopathy, unspecified: Secondary | ICD-10-CM

## 2024-05-18 DIAGNOSIS — N201 Calculus of ureter: Secondary | ICD-10-CM

## 2024-05-18 DIAGNOSIS — I82413 Acute embolism and thrombosis of femoral vein, bilateral: Secondary | ICD-10-CM

## 2024-05-18 DIAGNOSIS — R32 Unspecified urinary incontinence: Secondary | ICD-10-CM

## 2024-05-18 DIAGNOSIS — K5792 Diverticulitis of intestine, part unspecified, without perforation or abscess without bleeding: Secondary | ICD-10-CM

## 2024-05-18 DIAGNOSIS — L03115 Cellulitis of right lower limb: Secondary | ICD-10-CM

## 2024-05-18 DIAGNOSIS — N399 Disorder of urinary system, unspecified: Principal | ICD-10-CM

## 2024-05-18 DIAGNOSIS — G8220 Paraplegia, unspecified: Secondary | ICD-10-CM

## 2024-05-18 LAB — CBC WITH AUTO DIFFERENTIAL
BKR WAM ABSOLUTE IMMATURE GRANULOCYTES.: 0.01 x 1000/ÂµL (ref 0.00–0.30)
BKR WAM ABSOLUTE LYMPHOCYTE COUNT.: 1.37 x 1000/ÂµL (ref 0.60–3.70)
BKR WAM ABSOLUTE NRBC: 0 x 1000/ÂµL (ref 0.00–1.00)
BKR WAM ANC (ABSOLUTE NEUTROPHIL COUNT): 2.59 x 1000/ÂµL (ref 2.00–7.60)
BKR WAM BASOPHIL ABSOLUTE COUNT.: 0.05 x 1000/ÂµL (ref 0.00–1.00)
BKR WAM BASOPHILS: 1 % (ref 0.0–1.4)
BKR WAM EOSINOPHIL ABSOLUTE COUNT.: 0.46 x 1000/ÂµL (ref 0.00–1.00)
BKR WAM EOSINOPHILS: 9.3 % — ABNORMAL HIGH (ref 0.0–5.0)
BKR WAM HEMATOCRIT: 32.8 % — ABNORMAL LOW (ref 35.00–45.00)
BKR WAM HEMOGLOBIN: 9.7 g/dL — ABNORMAL LOW (ref 11.7–15.5)
BKR WAM IMMATURE GRANULOCYTES: 0.2 % (ref 0.0–1.0)
BKR WAM LYMPHOCYTES: 27.7 % (ref 17.0–50.0)
BKR WAM MCH: 27.7 pg (ref 27.0–33.0)
BKR WAM MCHC: 29.6 g/dL — ABNORMAL LOW (ref 31.0–36.0)
BKR WAM MCV: 93.7 fL (ref 80.0–100.0)
BKR WAM MONOCYTE ABSOLUTE COUNT.: 0.47 x 1000/ÂµL (ref 0.00–1.00)
BKR WAM MONOCYTES: 9.5 % (ref 4.0–12.0)
BKR WAM MPV: 10.3 fL (ref 8.0–12.0)
BKR WAM NEUTROPHILS: 52.3 % (ref 39.0–72.0)
BKR WAM NUCLEATED RED BLOOD CELLS: 0 % (ref 0.0–1.0)
BKR WAM PLATELETS: 319 x1000/ÂµL (ref 150–420)
BKR WAM RDW-CV: 14.6 % (ref 11.0–15.0)
BKR WAM RED BLOOD CELL COUNT.: 3.5 M/ÂµL — ABNORMAL LOW (ref 4.00–6.00)
BKR WAM WHITE BLOOD CELL COUNT: 5 x1000/ÂµL (ref 4.0–11.0)

## 2024-05-18 NOTE — Progress Notes
 Sp urs stent with stricturre better fu [place]  HPICrystal A Kilgore is a 68 y.o. female  status post bilateral stent placement for urosepsisPatient had passed a right ureteral stone has not asymptomatic left ureteral stonePatient more alert todayUrine blood-tinged07/07/2023 patient looks reasonably well with a clear understanding what is going onPatient with Klebsiella and urine of presses urine blood-tinged improving\WBC down to 7.9 from 22 creatinine 0.757/11/2023 sp bilateral stent passed stone r uretr has left midOn ceftin  sepsis PAR for urs on left remove stent rightUa wbc  09/15/2023 patient is status post bilateral ureteral obstruction urgent bilateral stent placedPatient did have right ureteral stone Past had a difficult ureteroscopy on the left side secondary to 4 mm stone in the mid proximal ureterCurrently has stent placed since middle of JulyAdmitted for fever sepsisHopefully during this hospitalization will require ureteroscopy probably next week and recommendations to follow10/01/2023 schedle cysto urs in am PAR all discussed10/03/2023 sp urs with stone extraction Pt no issues but wbc 46!!No fever low BPLytes normalCt some mild fluid and mild hydro stent good position fluid around kidney11/05/2023 lengthy discussion concerning with the patient and conservativelyPatient with a mid ureteral stricture complicated with ureteral calculiHas had 2 ureteroscopy is both very difficult suspect patient has significant stricture in the mid ureterAll options were discussed with the patient and family including removing the stent in evaluatedMay we may require percutaneous and reconstructionOr we can change the stent every 3-6 months without issueCystoscopy to see calcifications after 1 month todayCystoscopy showed minimal calcification after 1 month 02/01/2019 5 patient with dense ureteral stricture managed with stent exchangesLast stent exchange was October 2024Here for us  evaluation of distal stent to see if this calcificationWill need stent exchangePatient 4 months out with partial lower calcifications stent54/25/2025 hx of diff me ureteral stricture stone last URS all clear poss soft area 1 year managed with stent etcDc stent removalStones on urocitk b ilateralAlso colostomy wants reversal5/28/2025 sp urs difficult URS stricture 06/2023 !!Fu [place] no hydro seen last seen URS 02/2024 And stent removed 04/15/2024 0% Calcium  oxalate dihydrate.  50% Calcium  phosphate (apatite). On urocitk !! Mimimal stone burden HPI Past Medical HistoryPast Medical History    Past Medical History: Diagnosis Date  Acute embolism and thrombosis of unspecified femoral vein (HC Code)    Anemia    Cellulitis and abscess of right lower extremity    Dysphagia    Encephalopathy    GERD (gastroesophageal reflux disease)    Hypercalcemia    Paraplegia (HC Code)    Rheumatoid arthritis (HC Code)     Past Surgical HistoryPast Surgical History     Past Surgical History: Procedure Laterality Date  BAND HEMORRHOIDECTOMY      CYSTECTOMY   1972  LAPAROSCOPIC SALPINGOOPHERECTOMY   1986  LAPAROTOMY   2022  PARTIAL COLECTOMY   2022  PEG TUBE PLACEMENT   01/2022  UPPER GASTROINTESTINAL ENDOSCOPY       AllergiesNo Known [email or web address] Social HistorySocial History     Tobacco Use  Smoking status: Never  Smokeless tobacco: Never  Family History No family history on file. Review of SystemsNo symptoms beyond those noted in HPI and PMH- all other systems are negative Physical ExamBP 103/66  - Pulse 78  - Temp 99.8 ?F (37.7 ?C) (Temporal)  - Resp 20  - Ht 5' 10 (1.778 m)  - Wt 65.6 kg  - SpO2 95%  - BMI 20.75 kg/m?      Lab Results Component Value Date   WBC 7.9 06/29/2023   HGB  9.1 (L) 06/29/2023   HCT 28.80 (L) 06/29/2023   PLT 176 06/29/2023   ALT 8 (L) 06/29/2023   AST 9 06/29/2023   NA 144 06/29/2023   K 3.2 (L) 06/29/2023   CL 116 (H) 06/29/2023   CREATININE 0.75 06/29/2023   BUN 9 06/29/2023   CO2 23 06/29/2023   TSH 0.527 03/09/2023   INR 1.08 06/28/2023   GLU 88 06/29/2023   HGBA1C 5.5 03/09/2023   No results found for: UCOLOR, UGLUCOSE, UKETONE, USPECGRAVITY, UPH, UPROTEIN, UNITRATES, UBLOOD, ULEUKOCYTES, UA  No results found for: PVRPOCT     Previous resultsNo results found for: TESTOSTERONE  AUA Symptom Score today:       No data to display      04/15/2024  Danielle [name] is here today for a cystoscopy and stent removal.  Patient informed of procedure and questions answered.  Instructions for pre procedure given to patient.  Written consent and time out obtained.  Patient prepped according to protocol.  Laterality of stent confirmed on the left side.  Tolerated procedure well.  VSS.  Post procedure instructions reviewed with patient.  discussed risks of sepsis stricture    Patient with multiple issues left renal stone left ureteral stricture lengthy discussion with family    Assessment & Plan: ALIYYAH RIESE is a 68 y.o  improved stonesStay on hippreand urocitk no abx for pos culture

## 2024-06-14 ENCOUNTER — Other Ambulatory Visit: Admit: 2024-06-14 | Payer: Medicare (Managed Care) | Attending: Nephrology | Primary: Nephrology

## 2024-06-14 DIAGNOSIS — N202 Calculus of kidney with calculus of ureter: Secondary | ICD-10-CM

## 2024-06-14 LAB — URINE MICROSCOPIC     (BH GH LMW YH)
BKR RBC/HPF, UA (INSTRUMENT): 2 /HPF (ref 0–2)
BKR URINE SQUAMOUS EPITHELIAL CELLS, UA (INSTRUMENT): <1 /HPF (ref 0–5)
BKR WBC/HPF, UA (INSTRUMENT): 112 /HPF — ABNORMAL HIGH (ref 0–5)

## 2024-06-14 LAB — URINALYSIS-MACROSCOPIC W/REFLEX MICROSCOPIC
BKR BILIRUBIN, UA: NEGATIVE
BKR BLOOD, UA: NEGATIVE
BKR GLUCOSE, UA: NEGATIVE
BKR KETONES, UA: NEGATIVE
BKR NITRITE, UA: NEGATIVE
BKR PH, UA: 6 (ref 5.5–7.5)
BKR PROTEIN, UA: NEGATIVE
BKR SPECIFIC GRAVITY, UA: 1.009 (ref 1.005–1.030)
BKR UROBILINOGEN, UA: <2 mg/dL (ref <=2.0)

## 2024-06-15 ENCOUNTER — Encounter
Admit: 2024-06-15 | Payer: PRIVATE HEALTH INSURANCE | Attending: Vascular and Interventional Radiology | Primary: Nephrology

## 2024-06-17 LAB — URINE CULTURE

## 2024-06-20 ENCOUNTER — Other Ambulatory Visit: Admit: 2024-06-20 | Payer: Medicare (Managed Care) | Attending: Nephrology | Primary: Nephrology

## 2024-06-20 DIAGNOSIS — K219 Gastro-esophageal reflux disease without esophagitis: Principal | ICD-10-CM

## 2024-06-20 DIAGNOSIS — I82413 Acute embolism and thrombosis of femoral vein, bilateral: Secondary | ICD-10-CM

## 2024-06-20 DIAGNOSIS — Z932 Ileostomy status: Secondary | ICD-10-CM

## 2024-06-20 DIAGNOSIS — E785 Hyperlipidemia, unspecified: Secondary | ICD-10-CM

## 2024-06-20 DIAGNOSIS — R293 Abnormal posture: Secondary | ICD-10-CM

## 2024-06-20 DIAGNOSIS — K5780 Diverticulitis of intestine, part unspecified, with perforation and abscess without bleeding: Secondary | ICD-10-CM

## 2024-06-20 DIAGNOSIS — R509 Fever, unspecified: Secondary | ICD-10-CM

## 2024-06-20 DIAGNOSIS — G8220 Paraplegia, unspecified: Secondary | ICD-10-CM

## 2024-06-20 DIAGNOSIS — Z1612 Extended spectrum beta lactamase (ESBL) resistance: Secondary | ICD-10-CM

## 2024-06-20 DIAGNOSIS — F4320 Adjustment disorder, unspecified: Secondary | ICD-10-CM

## 2024-06-20 DIAGNOSIS — M6281 Muscle weakness (generalized): Secondary | ICD-10-CM

## 2024-06-20 LAB — CBC WITHOUT DIFFERENTIAL
BKR WAM ANC (ABSOLUTE NEUTROPHIL COUNT): 1.86 x 1000/ÂµL — ABNORMAL LOW (ref 2.00–7.60)
BKR WAM HEMATOCRIT: 32 % — ABNORMAL LOW (ref 35.00–45.00)
BKR WAM HEMOGLOBIN: 9.6 g/dL — ABNORMAL LOW (ref 11.7–15.5)
BKR WAM MCH: 27.7 pg (ref 27.0–33.0)
BKR WAM MCHC: 30 g/dL — ABNORMAL LOW (ref 31.0–36.0)
BKR WAM MCV: 92.5 fL (ref 80.0–100.0)
BKR WAM MPV: 10.4 fL (ref 8.0–12.0)
BKR WAM PLATELETS: 269 x1000/ÂµL (ref 150–420)
BKR WAM RDW-CV: 15.1 % — ABNORMAL HIGH (ref 11.0–15.0)
BKR WAM RED BLOOD CELL COUNT.: 3.46 M/ÂµL — ABNORMAL LOW (ref 4.00–6.00)
BKR WAM WHITE BLOOD CELL COUNT: 4 x1000/ÂµL (ref 4.0–11.0)

## 2024-06-21 ENCOUNTER — Telehealth: Admit: 2024-06-21 | Payer: PRIVATE HEALTH INSURANCE | Attending: Urology | Primary: Nephrology

## 2024-06-21 NOTE — Telephone Encounter
 Copied from CRM #[id number]. Topic: General Message - [facility]>> Jun 21, 2024  8:51 AM Danielle [name] wrote:[facility] MESSAGETime of call:   8:51 AMCaller:   [name], Danielle ACaller's relationship to patient:  Self  Calling from (pharmacy, hospital, agency, etc.):     Specialist you are calling for:  Dr. 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 She would like to know why that happened. Please advise. If not feeling well, what are symptoms:     If having symptoms, how long have the symptoms been present:     Does caller request to speak to someone urgently?     Best telephone number for callback:   [phone] Best time to return call:   any Permission to leave message:  yes   Danielle MANIFOLD [facility] Scheduler

## 2024-07-06 ENCOUNTER — Telehealth: Admit: 2024-07-06 | Payer: PRIVATE HEALTH INSURANCE | Attending: Urology | Primary: Nephrology

## 2024-07-06 NOTE — Telephone Encounter
 Copied from CRM #[id number]. Topic: General Message - [facility]>> Jul 06, 2024  8:49 AM Jeoffrey D wrote:[facility] MESSAGETime of call:   8:49 AMCaller:   Figler, Everlyn AReason for call:   Pt has colostomy bag & is calling in with concerns of some blood she noticed when wiping. She states the blood comes periodically & started last night into this morning. Pt states blood is minimal but she'd like to know if this is normal. Please call back to discuss. Best telephone number for callback:   [phone]

## 2024-07-11 ENCOUNTER — Encounter: Admit: 2024-07-11 | Payer: PRIVATE HEALTH INSURANCE | Primary: Nephrology

## 2024-07-11 ENCOUNTER — Ambulatory Visit: Admit: 2024-07-11 | Payer: PRIVATE HEALTH INSURANCE | Primary: Nephrology

## 2024-07-11 VITALS — BP 114/72 | HR 71 | Ht 70.0 in | Wt 138.9 lb

## 2024-07-11 DIAGNOSIS — R1310 Dysphagia, unspecified: Secondary | ICD-10-CM

## 2024-07-11 DIAGNOSIS — I82419 Acute embolism and thrombosis of unspecified femoral vein: Secondary | ICD-10-CM

## 2024-07-11 DIAGNOSIS — K219 Gastro-esophageal reflux disease without esophagitis: Secondary | ICD-10-CM

## 2024-07-11 DIAGNOSIS — M069 Rheumatoid arthritis, unspecified: Secondary | ICD-10-CM

## 2024-07-11 DIAGNOSIS — G8220 Paraplegia, unspecified: Secondary | ICD-10-CM

## 2024-07-11 DIAGNOSIS — N200 Calculus of kidney: Secondary | ICD-10-CM

## 2024-07-11 DIAGNOSIS — R32 Unspecified urinary incontinence: Secondary | ICD-10-CM

## 2024-07-11 DIAGNOSIS — A499 Bacterial infection, unspecified: Secondary | ICD-10-CM

## 2024-07-11 DIAGNOSIS — E099 Drug or chemical induced diabetes mellitus without complications: Secondary | ICD-10-CM

## 2024-07-11 DIAGNOSIS — Z932 Ileostomy status: Principal | ICD-10-CM

## 2024-07-11 DIAGNOSIS — L03115 Cellulitis of right lower limb: Secondary | ICD-10-CM

## 2024-07-11 DIAGNOSIS — R627 Adult failure to thrive: Secondary | ICD-10-CM

## 2024-07-11 DIAGNOSIS — G9340 Encephalopathy, unspecified: Principal | ICD-10-CM

## 2024-07-11 DIAGNOSIS — D649 Anemia, unspecified: Secondary | ICD-10-CM

## 2024-07-11 DIAGNOSIS — K5792 Diverticulitis of intestine, part unspecified, without perforation or abscess without bleeding: Secondary | ICD-10-CM

## 2024-07-11 NOTE — Progress Notes
 Subjective:   Danielle [name] is a 68 y.o. female who had concerns including Follow-up (ILEOSTOMY FOLLOW UP).Danielle [name] is a 68 y.o. female resident of [facility].  She underwent an extended right colectomy with end ileostomy and mucous fistula in November 2022 with Dr. Eletha in [place] .  She has had multiple hospitalizations due to urosepsis with repeated involvement of urology.  She believes she may have a gastrostomy site that never healed, but on further questioning and examination, this may be the mucous fistula created in 2022.  She passes small amount of stool from anus at times, and a [place] scan done in May 2025 shows stool in rectal vault.       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 She has never used smokeless tobacco. She reports that she does not currently use alcohol . She reports that she does not use drugs.[name] has a current medication list which includes the following prescription(s): acetaminophen  (TYLENOL ) 325 mg tablet - Take 3 tablets (975 mg total) by mouth every 8 (eight) hours as needed for pain, apixaban  (ELIQUIS ) 5 mg tablet - Take 1 tablet (5 mg total) by mouth every 12 (twelve) hours, ferrous gluconate  (FERGON) 324 mg (38 mg iron ) tablet - Take 1 tablet (324 mg total) by mouth daily, folic acid  (FOLVITE ) 1 mg tablet - Take 1 tablet (1 mg total) by mouth daily, gabapentin  (NEURONTIN ) 400 mg capsule - Take 1 capsule (400 mg total) by mouth 2 (two) times daily with breakfast and dinner, magnesium  hydroxide (MILK OF MAGNESIA) 400 mg/5 mL suspension - Take 30 mLs by mouth daily as needed for constipation, menthol -zinc  oxide (CALMOSEPTIN) 0.44-20.6 % ointment - Apply topically every 8 (eight) hours. To Perineum, labia, buttocks, methenamine  (HIPREX ) 1 gram tablet - Take 1 tablet (1 g total) by mouth 2 (two) times daily, polyethylene glycol (MIRALAX ) 17 gram packet - Take 1 packet (17 g total) by mouth daily. Mix in 8 ounces of water , juice, soda, coffee or tea prior to taking, potassium citrate  (UROCIT-K ) 15 mEq extended release tablet - Take 1 tablet (15 mEq total) by mouth daily, rosuvastatin  (CRESTOR ) 10 mg tablet - Take 1 tablet (10 mg total) by mouth every evening, traMADoL  (ULTRAM ) 50 mg tablet - Take 1 tablet (50 mg total) by mouth every 8 (eight) hours as needed for pain, and ZINC  OXIDE TOP - Apply topically every 8 (eight) hours. 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)   Kidney stone on left side  Kidney stone  Septic shock (HC Code)   Sepsis due to urinary tract infection (HC Code)   Sepsis (HC Code)   Renal calculus  Sepsis, due to unspecified organism, unspecified whether acute organ dysfunction present (HC Code)   UTI (urinary tract infection)  S/P  cystoscopy with ureteral stent placement  UTI due to extended-spectrum beta lactamase (ESBL) producing Escherichia coli  Fever, unspecified fever cause  Metabolic encephalopathy  Ureteral stricture, left Review of Systems Constitutional:  Negative for activity change, chills, fatigue and unexpected weight change. Gastrointestinal:  Negative for abdominal distention and abdominal pain.   Objective:  BP 114/72 (Site: l a)  - Pulse 71  - Ht 5' 10 (1.778 m)  - Wt 63 kg  - SpO2 99%  - BMI 19.93 kg/m? There were no orthostatic vitals filed for this visit.Physical ExamHENT:    Head: Normocephalic and atraumatic.    Nose: Nose normal.    Mouth/Throat:    Mouth: Mucous membranes are moist. Eyes:    Extraocular Movements: Extraocular movements intact. Cardiovascular:    Rate and Rhythm: Normal rate. Abdominal:    General: Abdomen is flat.    Palpations: Abdomen is soft. Musculoskeletal:    Cervical back: Normal range of motion. Neurological:    General: No focal deficit present.    Mental Status: She is alert. Mental status is at baseline. Nursing home resident, arrives with her provider.  She is able to recall most of her medical history without full details.  On examination, there is a healed scar that appears to be that of the G-tube.  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  Midline scar appears contracted.        Assessment / Plan:  [name] is a 68 y.o. female with a complex history involving an extended right colectomy, mucous fistula, and end ileostomy - considering takedown.  There is mention of the pathology being benign, but I cannot tell what caused the mid-transverse perforation - more distal obstruction?  Operative note reviewed. I cannot see her having followed up with either colorectal or had a colonoscopy, even though it has been mentioned multiple times throughout her chart.  Will coordinate with her PCP.  Discussed with nurse manager at [facility].  Total time spent 74 minutes reviewing chart, speaking with staff, and reaching out to both Dr. Gwenn Nail office and [facility] staff        Electronically Signed by Sula Brasher, MD, July 11, 2024  [1] Past Surgical History:Procedure Laterality Date  BAND HEMORRHOIDECTOMY    CYSTECTOMY  1972  ILEOSTOMY    LAPAROSCOPIC SALPINGOOPHERECTOMY  1986  LAPAROTOMY  2022  PARTIAL COLECTOMY  2022  PEG TUBE PLACEMENT  01/2022  UPPER GASTROINTESTINAL ENDOSCOPY    URETERAL STENT PLACEMENT Left

## 2024-07-12 NOTE — Telephone Encounter
 Danielle [name] contacted in regards to call to the care center Reason for call:   Pt states she has a colostomy bag and noticed when she urinates and wipes she can see some feces but no blood. She would like to know why that happened. Please advise.   Patient denies ever mentioning feces in her urine.  She states she had poop coming out of her rectum and she has a colostomy bag so she was concerned.  She saw her primary Dr yesterday who confirmed this may happen and she was ok.  [name] conservator/restorer for the call and again states has no urinary issues.

## 2024-07-28 ENCOUNTER — Encounter: Admit: 2024-07-28 | Payer: PRIVATE HEALTH INSURANCE | Attending: Medical Oncology | Primary: Nephrology

## 2024-07-28 ENCOUNTER — Ambulatory Visit: Admit: 2024-07-28 | Payer: PRIVATE HEALTH INSURANCE | Attending: Medical Oncology | Primary: Nephrology

## 2024-07-28 ENCOUNTER — Inpatient Hospital Stay: Admit: 2024-07-28 | Discharge: 2024-07-28 | Payer: PRIVATE HEALTH INSURANCE | Primary: Nephrology

## 2024-07-28 VITALS — BP 93/62 | HR 68 | Temp 97.90000°F | Resp 18 | Ht 70.0 in | Wt 150.0 lb

## 2024-07-28 DIAGNOSIS — G8220 Paraplegia, unspecified: Secondary | ICD-10-CM

## 2024-07-28 DIAGNOSIS — K219 Gastro-esophageal reflux disease without esophagitis: Secondary | ICD-10-CM

## 2024-07-28 DIAGNOSIS — Z7901 Long term (current) use of anticoagulants: Principal | ICD-10-CM

## 2024-07-28 DIAGNOSIS — D508 Other iron deficiency anemias: Principal | ICD-10-CM

## 2024-07-28 DIAGNOSIS — G9340 Encephalopathy, unspecified: Principal | ICD-10-CM

## 2024-07-28 DIAGNOSIS — D649 Anemia, unspecified: Secondary | ICD-10-CM

## 2024-07-28 DIAGNOSIS — R32 Unspecified urinary incontinence: Secondary | ICD-10-CM

## 2024-07-28 DIAGNOSIS — A499 Bacterial infection, unspecified: Secondary | ICD-10-CM

## 2024-07-28 DIAGNOSIS — L03115 Cellulitis of right lower limb: Secondary | ICD-10-CM

## 2024-07-28 DIAGNOSIS — N200 Calculus of kidney: Secondary | ICD-10-CM

## 2024-07-28 DIAGNOSIS — Z932 Ileostomy status: Secondary | ICD-10-CM

## 2024-07-28 DIAGNOSIS — I82419 Acute embolism and thrombosis of unspecified femoral vein: Secondary | ICD-10-CM

## 2024-07-28 DIAGNOSIS — R627 Adult failure to thrive: Secondary | ICD-10-CM

## 2024-07-28 DIAGNOSIS — M069 Rheumatoid arthritis, unspecified: Secondary | ICD-10-CM

## 2024-07-28 DIAGNOSIS — R1310 Dysphagia, unspecified: Secondary | ICD-10-CM

## 2024-07-28 DIAGNOSIS — K5792 Diverticulitis of intestine, part unspecified, without perforation or abscess without bleeding: Secondary | ICD-10-CM

## 2024-07-28 DIAGNOSIS — E099 Drug or chemical induced diabetes mellitus without complications: Secondary | ICD-10-CM

## 2024-07-28 LAB — COMPREHENSIVE METABOLIC PANEL
BKR A/G RATIO: 0.8
BKR ALANINE AMINOTRANSFERASE (ALT): 13 U/L (ref 12–78)
BKR ALBUMIN: 3.4 g/dL (ref 3.4–5.0)
BKR ALKALINE PHOSPHATASE: 120 U/L (ref 20–120)
BKR ANION GAP: 3 — ABNORMAL LOW (ref 5–18)
BKR ASPARTATE AMINOTRANSFERASE (AST): 9 U/L (ref 5–37)
BKR AST/ALT RATIO: 0.7
BKR BILIRUBIN TOTAL: 0.2 mg/dL (ref 0.0–1.0)
BKR BLOOD UREA NITROGEN: 13 mg/dL (ref 8–25)
BKR BUN / CREAT RATIO: 13 (ref 8.0–25.0)
BKR CALCIUM: 10.1 mg/dL (ref 8.4–10.3)
BKR CHLORIDE: 114 mmol/L (ref 95–115)
BKR CO2: 26 mmol/L (ref 21–32)
BKR CREATININE DELTA: -0.03
BKR CREATININE: 1 mg/dL (ref 0.50–1.30)
BKR EGFR, CREATININE (CKD-EPI 2021): >60 mL/min/1.73m2 (ref >=60)
BKR GLOBULIN: 4.4 g/dL
BKR GLUCOSE: 107 mg/dL — ABNORMAL HIGH (ref 70–100)
BKR OSMOLALITY CALCULATION: 286 mosm/kg (ref 275–295)
BKR POTASSIUM: 4.1 mmol/L (ref 3.5–5.1)
BKR PROTEIN TOTAL: 7.8 g/dL (ref 6.4–8.2)
BKR SODIUM: 143 mmol/L (ref 136–145)

## 2024-07-28 LAB — CBC WITHOUT DIFFERENTIAL
BKR WAM ANC (ABSOLUTE NEUTROPHIL COUNT): 4.79 x 1000/ÂµL (ref 2.00–7.60)
BKR WAM HEMATOCRIT: 35.7 % (ref 35.00–45.00)
BKR WAM HEMOGLOBIN: 11.1 g/dL — ABNORMAL LOW (ref 11.7–15.5)
BKR WAM MCH: 28.3 pg (ref 27.0–33.0)
BKR WAM MCHC: 31.1 g/dL (ref 31.0–36.0)
BKR WAM MCV: 91.1 fL (ref 80.0–100.0)
BKR WAM MPV: 10.3 fL (ref 8.0–12.0)
BKR WAM PLATELETS: 272 x1000/ÂµL (ref 150–420)
BKR WAM RDW-CV: 14.6 % (ref 11.0–15.0)
BKR WAM RED BLOOD CELL COUNT.: 3.92 M/ÂµL — ABNORMAL LOW (ref 4.00–6.00)
BKR WAM WHITE BLOOD CELL COUNT: 6.6 x1000/ÂµL (ref 4.0–11.0)

## 2024-07-28 LAB — IRON AND TIBC
BKR IRON SATURATION (SCCCT BH GH LM): 13 % (ref 11–46)
BKR IRON: 41 ug/dL (ref 40–150)
BKR TOTAL IRON BINDING CAPACITY (SCCCT  BH GH LM): 314 ug/dL (ref 250–450)

## 2024-07-28 LAB — FERRITIN: BKR FERRITIN: 16 ng/mL (ref 8–252)

## 2024-07-28 MED ORDER — PANTOPRAZOLE 20 MG TABLET,DELAYED RELEASE
20 | ORAL | 1.00 refills | 30.00000 days | Status: AC
Start: 2024-07-28 — End: ?

## 2024-07-28 NOTE — Progress Notes
 Subjective:   Danielle [name] is a 68 y.o. female with a history of DVT and anemia secondary to blood lossHPIIn November 2022, she was admitted to a hospital in [place]  with hemoperitoneum, due to a diverticular perforation.  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  She also developed encephalopathy.  She was eventually transferred to a skilled nursing facility on 02/05/2022.  Thereafter, patient was brought to [facility] on 02/11/2022 for management of encephalopathy.  She had a six-month hospitalization.  She was discharged to another skilled nursing care facility on 08/14/2022 with instructions to follow-up with a hematologist about the duration of anticoagulation therapy. She was then admitted to [facility] in March of 2024 again with altered mental status secondary to sepsis.  She was treated for UTI/sepsis and discharged back to skilled nursing care facility within a week. On 05/24/2023, she presented to [facility] with blood from the stoma.Doppler ultrasound on 05/24/2023 showed that she have a new partially occlusive thrombus in the right popliteal vein. Blood test on 05/24/2023 was consistent with iron  deficiencyHemoglobin was reduced at 8.7She received 1 g of iron  dextran on 06/03/2024At the time of her initial hematologic consultation on 05/26/2023, I felt that she was at an increased risk of future thrombus and I recommended that she remain on the anticoagulant therapy indefinitely - until her condition changes.  INTERIM HISTORY:Since our last visit to the clinic 3 months ago, she had a follow-up visit with the urologistShe also had a consultation with a general surgeon regarding the stomaShe has not had any acute health issuesShe remains on EliquisShe reports that she gets dizzy at timesShe has not had any signs of intestinal or urinary blood lossShe denied trauma/injuriesPAST MEDICAL HISTORY:Rheumatoid arthritisDiverticular perforation with abscess-November 2022Recurrent sepsisAnemia/iron  deficiencyMEDICATIONS:  Updated 07/28/2024 SOCIAL HISTORY:Never smokedNever consumed alcohol  regularlyFAMILY HISTORY:No known family history of malignancies ALLERGIES:No Known Allergies Review of Systems  Objective:  BP 93/62 (Site: r a, Position: Sitting, Cuff Size: Medium)  - Pulse 68  - Temp 97.9 ?F (36.6 ?C) (Temporal)  - Resp 18  - Ht 5' 10 (1.778 m)  - Wt 68 kg Comment: verbal weight pt in wheelchair - SpO2 99%  - BMI 21.52 kg/m? Physical ExamRight calf 34 cm; no tendernessLeft calf 32 cm; no tenderness Latest Ref Rng 05/18/2024 06/20/2024 07/28/2024 Labs     Hemoglobin 11.7 - 15.5 g/dL 9.7 (L)  9.6 (L)  88.8 (L)  Hematocrit 35.00 - 45.00 % 32.80 (L)  32.00 (L)  35.70  WBC 4.0 - 11.0 x1000/?L 5.0  4.0  6.6  Platelets 150 - 420 x1000/?L 319  269  272  Sodium 136 - 145 mmol/L    Potassium 3.5 - 5.1 mmol/L    Glucose 70 - 100 mg/dL    BUN 8 - 25 mg/dL    Creatinine 9.49 - 8.69 mg/dL    Calcium  8.4 - 10.3 mg/dL    Albumin 3.4 - 5.0 g/dL    Total Bilirubin 0.0 - 1.0 mg/dL    RBC 5.99 - 3.99 M/?L 3.50 (L)  3.46 (L)  3.92 (L)  MCV 80.0 - 100.0 fL 93.7  92.5  91.1  ANC (Abs Neutrophil Count) 2.00 - 7.60 x 1000/?L 2.59  1.86 (L)  4.79  Ferritin 8 - 252 ng/mL     Legend:(L) Low  Assessment / Plan:  History of DVT Questionable New right popliteal DVT-05/24/2023 Anemia due to blood loss Hemoglobin is higherThe  iron  panel is pendingThe calf exam is stable/unremarkableShe was advised to continue with Eliquis  5 mg b.i.d.She was advised to continue with ironShe will return for another follow-up blood test and a visit in 4 monthsPlease note: MModal speech recognition software was used to compose this note. Notes are reviewed for accuracy but unintended translational errors can occur. Please contact me if there are any questions about the content of this note.Orders Placed This Encounter Procedures  CBC without differential  Iron , TIBC and ferritin panel ([facility] LMW Q YH)  D-dimer, quantitative  Comprehensive metabolic panel  Return Visit with Labs (peripheral draw)  Electronically Signed by Anthon Robinette Ruth, MD, July 28, 2024

## 2024-08-08 ENCOUNTER — Other Ambulatory Visit: Admit: 2024-08-08 | Payer: PRIVATE HEALTH INSURANCE | Attending: Nephrology | Primary: Nephrology

## 2024-08-08 DIAGNOSIS — I82413 Acute embolism and thrombosis of femoral vein, bilateral: Principal | ICD-10-CM

## 2024-08-08 DIAGNOSIS — M069 Rheumatoid arthritis, unspecified: Secondary | ICD-10-CM

## 2024-08-08 LAB — LIPID PANEL
BKR CHOLESTEROL/HDL RATIO: 2.8 (ref 1.0–3.9)
BKR CHOLESTEROL: 144 mg/dL (ref 50–200)
BKR HDL CHOLESTEROL: 52 mg/dL (ref 40–90)
BKR LDL CHOLESTEROL SAMPSON CALCULATED: 75 mg/dL (ref 20–130)
BKR TRIGLYCERIDES: 93 mg/dL (ref 30–200)

## 2024-08-18 ENCOUNTER — Other Ambulatory Visit: Admit: 2024-08-18 | Payer: PRIVATE HEALTH INSURANCE | Attending: Nephrology | Primary: Nephrology

## 2024-08-18 DIAGNOSIS — M6281 Muscle weakness (generalized): Secondary | ICD-10-CM

## 2024-08-18 DIAGNOSIS — I10 Essential (primary) hypertension: Principal | ICD-10-CM

## 2024-08-18 LAB — CBC WITHOUT DIFFERENTIAL
BKR WAM ANC (ABSOLUTE NEUTROPHIL COUNT): 2.99 x 1000/ÂµL (ref 2.00–7.60)
BKR WAM HEMATOCRIT: 36.9 % (ref 35.00–45.00)
BKR WAM HEMOGLOBIN: 11.3 g/dL — ABNORMAL LOW (ref 11.7–15.5)
BKR WAM MCH: 28 pg (ref 27.0–33.0)
BKR WAM MCHC: 30.6 g/dL — ABNORMAL LOW (ref 31.0–36.0)
BKR WAM MCV: 91.6 fL (ref 80.0–100.0)
BKR WAM MPV: 10.5 fL (ref 8.0–12.0)
BKR WAM PLATELETS: 320 x1000/ÂµL (ref 150–420)
BKR WAM RDW-CV: 14.7 % (ref 11.0–15.0)
BKR WAM RED BLOOD CELL COUNT.: 4.03 M/ÂµL (ref 4.00–6.00)
BKR WAM WHITE BLOOD CELL COUNT: 5.3 x1000/ÂµL (ref 4.0–11.0)

## 2024-09-19 ENCOUNTER — Other Ambulatory Visit: Admit: 2024-09-19 | Payer: PRIVATE HEALTH INSURANCE | Attending: Nephrology | Primary: Nephrology

## 2024-09-19 DIAGNOSIS — E785 Hyperlipidemia, unspecified: Secondary | ICD-10-CM

## 2024-09-19 DIAGNOSIS — G8220 Paraplegia, unspecified: Secondary | ICD-10-CM

## 2024-09-19 DIAGNOSIS — M069 Rheumatoid arthritis, unspecified: Principal | ICD-10-CM

## 2024-09-19 LAB — CBC WITHOUT DIFFERENTIAL
BKR WAM ANC (ABSOLUTE NEUTROPHIL COUNT): 2.72 x 1000/ÂµL (ref 2.00–7.60)
BKR WAM HEMATOCRIT: 35.4 % (ref 35.00–45.00)
BKR WAM HEMOGLOBIN: 10.6 g/dL — ABNORMAL LOW (ref 11.7–15.5)
BKR WAM MCH: 28 pg (ref 27.0–33.0)
BKR WAM MCHC: 29.9 g/dL — ABNORMAL LOW (ref 31.0–36.0)
BKR WAM MCV: 93.7 fL (ref 80.0–100.0)
BKR WAM MPV: 10.7 fL (ref 8.0–12.0)
BKR WAM PLATELETS: 290 x1000/ÂµL (ref 150–420)
BKR WAM RDW-CV: 14 % (ref 11.0–15.0)
BKR WAM RED BLOOD CELL COUNT.: 3.78 M/ÂµL — ABNORMAL LOW (ref 4.00–6.00)
BKR WAM WHITE BLOOD CELL COUNT: 4.9 x1000/ÂµL (ref 4.0–11.0)

## 2024-09-23 ENCOUNTER — Inpatient Hospital Stay: Admit: 2024-09-23 | Discharge: 2024-09-23 | Payer: PRIVATE HEALTH INSURANCE | Primary: Nephrology

## 2024-09-23 DIAGNOSIS — N201 Calculus of ureter: Secondary | ICD-10-CM

## 2024-10-02 ENCOUNTER — Other Ambulatory Visit: Admit: 2024-10-02 | Payer: PRIVATE HEALTH INSURANCE | Attending: Nephrology | Primary: Nephrology

## 2024-10-02 DIAGNOSIS — N200 Calculus of kidney: Principal | ICD-10-CM

## 2024-10-02 DIAGNOSIS — N10 Acute tubulo-interstitial nephritis: Secondary | ICD-10-CM

## 2024-10-02 DIAGNOSIS — Z1612 Extended spectrum beta lactamase (ESBL) resistance: Secondary | ICD-10-CM

## 2024-10-02 LAB — URINALYSIS-MACROSCOPIC W/REFLEX MICROSCOPIC
BKR BILIRUBIN, UA: NEGATIVE
BKR GLUCOSE, UA: NEGATIVE
BKR KETONES, UA: NEGATIVE
BKR NITRITE, UA: POSITIVE — AB
BKR PH, UA: 6 (ref 5.5–7.5)
BKR PROTEIN, UA: NEGATIVE
BKR SPECIFIC GRAVITY, UA: 1.012 (ref 1.005–1.030)
BKR UROBILINOGEN, UA: <2 mg/dL (ref <=2.0)

## 2024-10-02 LAB — URINE MICROSCOPIC     (BH GH LMW YH)
BKR RBC/HPF, UA (INSTRUMENT): 58 /HPF — ABNORMAL HIGH (ref 0–2)
BKR URINE SQUAMOUS EPITHELIAL CELLS, UA (INSTRUMENT): <1 /HPF (ref 0–5)
BKR WBC/HPF, UA (INSTRUMENT): 55 /HPF — ABNORMAL HIGH (ref 0–5)

## 2024-10-03 ENCOUNTER — Ambulatory Visit: Admit: 2024-10-03 | Payer: PRIVATE HEALTH INSURANCE | Attending: Urology | Primary: Nephrology

## 2024-10-03 ENCOUNTER — Encounter: Admit: 2024-10-03 | Payer: PRIVATE HEALTH INSURANCE | Attending: Urology | Primary: Nephrology

## 2024-10-03 VITALS — Ht 70.0 in | Wt 149.9 lb

## 2024-10-03 DIAGNOSIS — R32 Unspecified urinary incontinence: Secondary | ICD-10-CM

## 2024-10-03 DIAGNOSIS — I82419 Acute embolism and thrombosis of unspecified femoral vein: Secondary | ICD-10-CM

## 2024-10-03 DIAGNOSIS — D649 Anemia, unspecified: Secondary | ICD-10-CM

## 2024-10-03 DIAGNOSIS — M069 Rheumatoid arthritis, unspecified: Secondary | ICD-10-CM

## 2024-10-03 DIAGNOSIS — K219 Gastro-esophageal reflux disease without esophagitis: Secondary | ICD-10-CM

## 2024-10-03 DIAGNOSIS — Z932 Ileostomy status: Secondary | ICD-10-CM

## 2024-10-03 DIAGNOSIS — R627 Adult failure to thrive: Secondary | ICD-10-CM

## 2024-10-03 DIAGNOSIS — A499 Bacterial infection, unspecified: Secondary | ICD-10-CM

## 2024-10-03 DIAGNOSIS — L03115 Cellulitis of right lower limb: Secondary | ICD-10-CM

## 2024-10-03 DIAGNOSIS — N390 Urinary tract infection, site not specified: Secondary | ICD-10-CM

## 2024-10-03 DIAGNOSIS — G8220 Paraplegia, unspecified: Secondary | ICD-10-CM

## 2024-10-03 DIAGNOSIS — N200 Calculus of kidney: Secondary | ICD-10-CM

## 2024-10-03 DIAGNOSIS — G9340 Encephalopathy, unspecified: Principal | ICD-10-CM

## 2024-10-03 DIAGNOSIS — E099 Drug or chemical induced diabetes mellitus without complications: Secondary | ICD-10-CM

## 2024-10-03 DIAGNOSIS — R1310 Dysphagia, unspecified: Secondary | ICD-10-CM

## 2024-10-03 DIAGNOSIS — K5792 Diverticulitis of intestine, part unspecified, without perforation or abscess without bleeding: Secondary | ICD-10-CM

## 2024-10-03 LAB — URINE CULTURE

## 2024-10-03 MED ORDER — ESTRADIOL 0.01% (0.1 MG/GRAM) VAGINAL CREAM
0.01 | Freq: Every day | VAGINAL | 13 refills | 42.00000 days | Status: AC
Start: 2024-10-03 — End: ?

## 2024-10-03 NOTE — Progress Notes
 Sp urs stent with stricture doing better  HPICrystal A [name] is a 68 y.o. female  status post bilateral stent placement for urosepsisPatient had passed a right ureteral stone has not asymptomatic left ureteral stonePatient more alert todayUrine blood-tinged07/07/2023 patient looks reasonably well with a clear understanding what is going onPatient with Klebsiella and urine of presses urine blood-tinged improving\WBC down to 7.9 from 22 creatinine 0.757/11/2023 sp bilateral stent passed stone r uretr has left midOn ceftin  sepsis PAR for urs on left remove stent rightUa wbc  09/15/2023 patient is status post bilateral ureteral obstruction urgent bilateral stent placedPatient did have right ureteral stone Past had a difficult ureteroscopy on the left side secondary to 4 mm stone in the mid proximal ureterCurrently has stent placed since middle of JulyAdmitted for fever sepsisHopefully during this hospitalization will require ureteroscopy probably next week and recommendations to follow10/01/2023 schedle cysto urs in am PAR all discussed10/03/2023 sp urs with stone extraction Pt no issues but wbc 46!!No fever low BPLytes normalCt some mild fluid and mild hydro stent good position fluid around kidney11/05/2023 lengthy discussion concerning with the patient and conservativelyPatient with a mid ureteral stricture complicated with ureteral calculiHas had 2 ureteroscopy is both very difficult suspect patient has significant stricture in the mid ureterAll options were discussed with the patient and family including removing the stent in evaluatedMay we may require percutaneous and reconstructionOr we can change the stent every 3-6 months without issueCystoscopy to see calcifications after 1 month todayCystoscopy showed minimal calcification after 1 month 02/01/2019 5 patient with dense ureteral stricture managed with stent exchangesLast stent exchange was October 2024Here for us  evaluation of distal stent to see if this calcificationWill need stent exchangePatient 4 months out with partial lower calcifications stent54/25/2025 hx of diff me ureteral stricture stone last URS all clear poss soft area 1 year managed with stent etcDc stent removalStones on urocitk b ilateralAlso colostomy wants reversal5/28/2025 sp urs difficult URS stricture 06/2023 !!Fu Danielle [name] no hydro seen last seen URS 02/2024 And stent removed 04/15/2024 0% Calcium  oxalate dihydrate.  50% Calcium  phosphate (apatite). On urocitk !! Mimimal stone burden 10/03/2024 fu stonesNo uti no issues fu sonoNO stones seen !!!!!!!!!!!!!!!!!!!!No other problems Also uti on hipprex !! Doing great HPI Past Medical HistoryPast Medical History    Past Medical History: Diagnosis Date  Acute embolism and thrombosis of unspecified femoral vein (HC Code)    Anemia    Cellulitis and abscess of right lower extremity    Dysphagia    Encephalopathy    GERD (gastroesophageal reflux disease)    Hypercalcemia    Paraplegia (HC Code)    Rheumatoid arthritis (HC Code)     Past Surgical HistoryPast Surgical History     Past Surgical History: Procedure Laterality Date  BAND HEMORRHOIDECTOMY      CYSTECTOMY   1972  LAPAROSCOPIC SALPINGOOPHERECTOMY   1986  LAPAROTOMY   2022  PARTIAL COLECTOMY   2022  PEG TUBE PLACEMENT   01/2022  UPPER GASTROINTESTINAL ENDOSCOPY       AllergiesNo Known [email or web address] Social HistorySocial History     Tobacco Use  Smoking status: Never  Smokeless tobacco: Never  Family History No family history on file. Review of SystemsNo symptoms beyond those noted in HPI and PMH- all other systems are negative Physical ExamBP 103/66  - Pulse 78  - Temp 99.8 ?F (37.7 ?C) (Temporal)  - Resp 20  - Ht 5' 10 (1.778 m)  - Wt 65.6 kg  - SpO2 95%  - BMI 20.75  kg/m?      Lab Results Component Value Date   WBC 7.9 06/29/2023   HGB 9.1 (L) 06/29/2023   HCT 28.80 (L) 06/29/2023   PLT 176 06/29/2023   ALT 8 (L) 06/29/2023   AST 9 06/29/2023   NA 144 06/29/2023   K 3.2 (L) 06/29/2023   CL 116 (H) 06/29/2023   CREATININE 0.75 06/29/2023   BUN 9 06/29/2023   CO2 23 06/29/2023   TSH 0.527 03/09/2023   INR 1.08 06/28/2023   GLU 88 06/29/2023   HGBA1C 5.5 03/09/2023   No results found for: UCOLOR, UGLUCOSE, UKETONE, USPECGRAVITY, UPH, UPROTEIN, UNITRATES, UBLOOD, ULEUKOCYTES, UA  No results found for: PVRPOCT     Previous resultsNo results found for: TESTOSTERONE  AUA Symptom Score today:       No data to display      04/15/2024  Danielle [name] is here today for a cystoscopy and stent removal.  Patient informed of procedure and questions answered.  Instructions for pre procedure given to patient.  Written consent and time out obtained.  Patient prepped according to protocol.  Laterality of stent confirmed on the left side.  Tolerated procedure well.  VSS.  Post procedure instructions reviewed with patient.  discussed risks of sepsis stricture    Patient with multiple issues left renal stone left ureteral stricture lengthy discussion with family    Assessment & Plan: Danielle [name] is a 68 y.o  improved stonesStay on hippreand urocitk Add estrace 6 months [place]

## 2024-10-04 ENCOUNTER — Other Ambulatory Visit: Admit: 2024-10-04 | Payer: PRIVATE HEALTH INSURANCE | Attending: Nephrology | Primary: Nephrology

## 2024-10-04 DIAGNOSIS — I82413 Acute embolism and thrombosis of femoral vein, bilateral: Principal | ICD-10-CM

## 2024-10-04 DIAGNOSIS — G8220 Paraplegia, unspecified: Secondary | ICD-10-CM

## 2024-10-04 LAB — COMPREHENSIVE METABOLIC PANEL
BKR A/G RATIO: 0.7
BKR ALANINE AMINOTRANSFERASE (ALT): 12 U/L (ref 12–78)
BKR ALBUMIN: 3.2 g/dL — ABNORMAL LOW (ref 3.4–5.0)
BKR ALKALINE PHOSPHATASE: 103 U/L (ref 20–120)
BKR ANION GAP: 6 (ref 3–18)
BKR ASPARTATE AMINOTRANSFERASE (AST): 15 U/L (ref 5–37)
BKR AST/ALT RATIO: 1.3
BKR BILIRUBIN TOTAL: 0.2 mg/dL (ref 0.0–1.0)
BKR BLOOD UREA NITROGEN: 15 mg/dL (ref 8–25)
BKR BUN / CREAT RATIO: 16 (ref 8.0–25.0)
BKR CALCIUM: 9.8 mg/dL (ref 8.4–10.3)
BKR CHLORIDE: 113 mmol/L (ref 95–115)
BKR CO2: 22 mmol/L (ref 21–32)
BKR CREATININE DELTA: -0.06
BKR CREATININE: 0.94 mg/dL (ref 0.50–1.30)
BKR EGFR, CREATININE (CKD-EPI 2021): >60 mL/min/1.73m2 (ref >=60)
BKR GLOBULIN: 4.5 g/dL
BKR GLUCOSE: 105 mg/dL — ABNORMAL HIGH (ref 70–100)
BKR OSMOLALITY CALCULATION: 282 mosm/kg (ref 275–295)
BKR POTASSIUM: 5.1 mmol/L (ref 3.5–5.1)
BKR PROTEIN TOTAL: 7.7 g/dL (ref 6.4–8.2)
BKR SODIUM: 141 mmol/L (ref 136–145)

## 2024-10-04 LAB — CBC WITH AUTO DIFFERENTIAL
BKR WAM ABSOLUTE IMMATURE GRANULOCYTES.: 0.01 x 1000/ÂµL (ref 0.00–0.30)
BKR WAM ABSOLUTE LYMPHOCYTE COUNT.: 1.2 x 1000/ÂµL (ref 0.60–3.70)
BKR WAM ABSOLUTE NRBC: 0 x 1000/ÂµL (ref 0.00–1.00)
BKR WAM ANC (ABSOLUTE NEUTROPHIL COUNT): 2.44 x 1000/ÂµL (ref 2.00–7.60)
BKR WAM BASOPHIL ABSOLUTE COUNT.: 0.02 x 1000/ÂµL (ref 0.00–1.00)
BKR WAM BASOPHILS: 0.5 % (ref 0.0–1.4)
BKR WAM EOSINOPHIL ABSOLUTE COUNT.: 0.22 x 1000/ÂµL (ref 0.00–1.00)
BKR WAM EOSINOPHILS: 5 % (ref 0.0–5.0)
BKR WAM HEMATOCRIT: 34.9 % — ABNORMAL LOW (ref 35.00–45.00)
BKR WAM HEMOGLOBIN: 10.7 g/dL — ABNORMAL LOW (ref 11.7–15.5)
BKR WAM IMMATURE GRANULOCYTES: 0.2 % (ref 0.0–1.0)
BKR WAM LYMPHOCYTES: 27.1 % (ref 17.0–50.0)
BKR WAM MCH: 28.2 pg (ref 27.0–33.0)
BKR WAM MCHC: 30.7 g/dL — ABNORMAL LOW (ref 31.0–36.0)
BKR WAM MCV: 92.1 fL (ref 80.0–100.0)
BKR WAM MONOCYTE ABSOLUTE COUNT.: 0.53 x 1000/ÂµL (ref 0.00–1.00)
BKR WAM MONOCYTES: 12 % (ref 4.0–12.0)
BKR WAM MPV: 10.4 fL (ref 8.0–12.0)
BKR WAM NEUTROPHILS: 55.2 % (ref 39.0–72.0)
BKR WAM NUCLEATED RED BLOOD CELLS: 0 % (ref 0.0–1.0)
BKR WAM PLATELETS: 244 x1000/ÂµL (ref 150–420)
BKR WAM RDW-CV: 14.2 % (ref 11.0–15.0)
BKR WAM RED BLOOD CELL COUNT.: 3.79 M/ÂµL — ABNORMAL LOW (ref 4.00–6.00)
BKR WAM WHITE BLOOD CELL COUNT: 4.4 x1000/ÂµL (ref 4.0–11.0)

## 2024-10-04 LAB — IRON: BKR IRON: 49 ug/dL (ref 40–150)

## 2024-10-10 ENCOUNTER — Encounter: Admit: 2024-10-10 | Payer: PRIVATE HEALTH INSURANCE | Attending: Gastroenterology | Primary: Nephrology

## 2024-10-10 ENCOUNTER — Encounter
Admit: 2024-10-10 | Payer: PRIVATE HEALTH INSURANCE | Attending: Vascular and Interventional Radiology | Primary: Nephrology

## 2024-10-10 ENCOUNTER — Inpatient Hospital Stay
Admit: 2024-10-10 | Discharge: 2024-10-10 | Payer: PRIVATE HEALTH INSURANCE | Attending: Gastroenterology | Primary: Nephrology

## 2024-10-10 ENCOUNTER — Ambulatory Visit: Admit: 2024-10-10 | Payer: Medicare (Managed Care) | Attending: Internal Medicine | Primary: Nephrology

## 2024-10-10 DIAGNOSIS — Z932 Ileostomy status: Secondary | ICD-10-CM

## 2024-10-10 DIAGNOSIS — D649 Anemia, unspecified: Secondary | ICD-10-CM

## 2024-10-10 DIAGNOSIS — E099 Drug or chemical induced diabetes mellitus without complications: Secondary | ICD-10-CM

## 2024-10-10 DIAGNOSIS — Z79899 Other long term (current) drug therapy: Secondary | ICD-10-CM

## 2024-10-10 DIAGNOSIS — Z933 Colostomy status: Secondary | ICD-10-CM

## 2024-10-10 DIAGNOSIS — I82419 Acute embolism and thrombosis of unspecified femoral vein: Secondary | ICD-10-CM

## 2024-10-10 DIAGNOSIS — Z538 Procedure and treatment not carried out for other reasons: Secondary | ICD-10-CM

## 2024-10-10 DIAGNOSIS — Z83719 Family history of colon polyps, unspecified: Secondary | ICD-10-CM

## 2024-10-10 DIAGNOSIS — K2100 Gastro-esophageal reflux disease with esophagitis, without bleeding: Secondary | ICD-10-CM

## 2024-10-10 DIAGNOSIS — Z91199 Patient's noncompliance with other medical treatment and regimen due to unspecified reason: Secondary | ICD-10-CM

## 2024-10-10 DIAGNOSIS — Z1211 Encounter for screening for malignant neoplasm of colon: Secondary | ICD-10-CM

## 2024-10-10 DIAGNOSIS — N200 Calculus of kidney: Secondary | ICD-10-CM

## 2024-10-10 DIAGNOSIS — R1310 Dysphagia, unspecified: Secondary | ICD-10-CM

## 2024-10-10 DIAGNOSIS — Z931 Gastrostomy status: Secondary | ICD-10-CM

## 2024-10-10 DIAGNOSIS — Z7901 Long term (current) use of anticoagulants: Secondary | ICD-10-CM

## 2024-10-10 DIAGNOSIS — R1013 Epigastric pain: Principal | ICD-10-CM

## 2024-10-10 DIAGNOSIS — K5710 Diverticulosis of small intestine without perforation or abscess without bleeding: Secondary | ICD-10-CM

## 2024-10-10 DIAGNOSIS — K5792 Diverticulitis of intestine, part unspecified, without perforation or abscess without bleeding: Secondary | ICD-10-CM

## 2024-10-10 DIAGNOSIS — K2950 Unspecified chronic gastritis without bleeding: Secondary | ICD-10-CM

## 2024-10-10 DIAGNOSIS — L03115 Cellulitis of right lower limb: Secondary | ICD-10-CM

## 2024-10-10 DIAGNOSIS — Z9049 Acquired absence of other specified parts of digestive tract: Secondary | ICD-10-CM

## 2024-10-10 DIAGNOSIS — Z800 Family history of malignant neoplasm of digestive organs: Secondary | ICD-10-CM

## 2024-10-10 DIAGNOSIS — Z8719 Personal history of other diseases of the digestive system: Secondary | ICD-10-CM

## 2024-10-10 DIAGNOSIS — R32 Unspecified urinary incontinence: Secondary | ICD-10-CM

## 2024-10-10 DIAGNOSIS — K222 Esophageal obstruction: Secondary | ICD-10-CM

## 2024-10-10 DIAGNOSIS — G8220 Paraplegia, unspecified: Secondary | ICD-10-CM

## 2024-10-10 DIAGNOSIS — M069 Rheumatoid arthritis, unspecified: Secondary | ICD-10-CM

## 2024-10-10 DIAGNOSIS — E119 Type 2 diabetes mellitus without complications: Secondary | ICD-10-CM

## 2024-10-10 DIAGNOSIS — R627 Adult failure to thrive: Secondary | ICD-10-CM

## 2024-10-10 DIAGNOSIS — A499 Bacterial infection, unspecified: Secondary | ICD-10-CM

## 2024-10-10 DIAGNOSIS — G9340 Encephalopathy, unspecified: Principal | ICD-10-CM

## 2024-10-10 DIAGNOSIS — K219 Gastro-esophageal reflux disease without esophagitis: Secondary | ICD-10-CM

## 2024-10-10 MED ORDER — LIDOCAINE (PF) 20 MG/ML (2 %) INTRAVENOUS SOLUTION
20 | INTRAVENOUS | Status: DC | PRN
Start: 2024-10-10 — End: 2024-10-10
  Administered 2024-10-10: 10:00:00 20 mg/mL (2 %) via INTRAVENOUS

## 2024-10-10 MED ORDER — PROPOFOL 10 MG/ML INTRAVENOUS EMULSION
10 | INTRAVENOUS | Status: DC | PRN
Start: 2024-10-10 — End: 2024-10-10
  Administered 2024-10-10 (×10): 10 mg/mL via INTRAVENOUS

## 2024-10-10 MED ORDER — SODIUM CHLORIDE 0.9 % (FLUSH) INJECTION SYRINGE
0.9 | INTRAVENOUS | Status: DC | PRN
Start: 2024-10-10 — End: 2024-10-10

## 2024-10-10 MED ORDER — LACTATED RINGERS INTRAVENOUS SOLUTION
INTRAVENOUS | Status: DC
Start: 2024-10-10 — End: 2024-10-10
  Administered 2024-10-10: 10:00:00 1000.000 mL/h via INTRAVENOUS

## 2024-10-10 MED ORDER — LIDOCAINE (PF) 20 MG/ML (2 %) INJECTION SOLUTION
20 | Status: CP
Start: 2024-10-10 — End: ?

## 2024-10-10 MED ORDER — PROPOFOL 10 MG/ML INTRAVENOUS EMULSION
10 | Status: CP
Start: 2024-10-10 — End: ?

## 2024-10-10 NOTE — Other
 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]Ileostomy status (HC Code)  (HC CODE) [Z93.2] Patient Coded Diagnosis   Pre-op diagnosis: Abdominal wall fistula, Ileostomy status (HC Code)  (HC CODE)  Post-op diagnosis: Abdominal wall fistula, Ileostomy status (HC Code)  (HC CODE)  Patient Diagnosis   Pre-op diagnosis: Abdominal wall fistula [K63.2], Ileostomy status (HC Code)  (HC CODE) [Z93.2]  Post-op diagnosis: Gastritis, surgical changes    Post-op diagnosis:   * Abdominal wall fistula [K63.2]   * Ileostomy status (HC Code)  (HC CODE) [S06.2]Operative Procedure(s) :Procedure(s) (LRB):UPPER GI ENDOSCOPY; DX, W/WO SPECIMEN COLLECTION, BRUSHING/WASHING (SEP PROC) (N/A)COLONOSCOPY, FLEXIBLE; DX, W/WO SPECIMENS/COLON DECOMP (SEP PROC) (N/A)UPPER GI ENDOSCOPY; W/BX, SINGLE/MULTIPLEPost-op Procedure & Diagnosis ConfirmationPost-op Diagnosis: Post-op Diagnosis confirmed (no changes)Post-op Procedure: Post-op Procedure confirmed (no changes)Anesthesia ClarifiersGI/Endoscopy: Non-Screening Colonoscopy (see above for post-diagnosis and procedure)

## 2024-10-10 NOTE — Anesthesia Post-Procedure Evaluation
 Anesthesia Post-op NotePatient: Keyuana A MaloneProcedure(s):  Procedure(s) (LRB):UPPER GI ENDOSCOPY; DX, W/WO SPECIMEN COLLECTION, BRUSHING/WASHING (SEP PROC) (N/A)COLONOSCOPY, FLEXIBLE; DX, W/WO SPECIMENS/COLON DECOMP (SEP PROC) (N/A)UPPER GI ENDOSCOPY; W/BX, SINGLE/MULTIPLE Last Vitals:  I have reviewed the post-operative vital signs as noted in the Epic chart.POSTOP EVALUATION:      Patient Recovery Location:  PACU     Vital Signs Status:  Stable     Patient Participation:  Patient participated     Mental Status:  Awake, alert and oriented     Respiratory Status:  Acceptable     Airway Patency:  Patent     Cardiovascular/Hydration Status:  Stable     Pain Management:  Satisfactory to patient     Nausea/Vomiting Status:  Satisfactory to patientThere were no known notable events for this encounter.

## 2024-10-10 NOTE — Discharge Instructions
 Dr. Janetta Hora will call with biopsy results. You received sedation today.Take it easy today - increase activity as tolerated.No driving or operating any power machinery.Do not make any personal or legal decisions today.No alcohol today.Resume your regular diet (for today avoid eating foods with sharp edges such as pretzels, popcorn, potato chips)??? Call your doctor if you have any problems or concerns, a fever over 101 degrees, persistent abdominal pain, nausea, vomiting, severe sore throat, chest pain, or blood in stool.?A nurse will make a follow up phone call to you on the day after your procedure or Monday if your procedure was on Friday.?Our phone number at the endoscopy center is [phone].? After Care Instructions for a Peripheral Intravenous (IV) CatheterA peripheral IV catheter is a plastic tube that is inserted into a vein used to give fluids, antibiotics, medications, or blood products.  The IV catheter is removed when no longer needed or for site pain, redness, swelling, or leaking.  Care of the area:When the IV catheter is removed, a small dressing is placed over the site.  Leave the dressing in place for at least 30 minutes.Keep area cleanCheck the area for redness, swelling, pain, drainage, and warmthIf redness or pain develops, you may apply warm compresses to the area and elevate the hand/arm.Call your doctor if symptoms become worse. Bruising:Bruising may occur after IV catheter removal for patients who have a tendency to bleed due to certain medications (i.e. blood thinners or long-term steroids). Bruising goes away on its own over time and can take 1 - 2 weeks. Bruising may or may not be tender or painful and color changes (blue, dark blue, purple-red, yellow) are normalYou may apply ice to the area and elevate your arm.Call your doctor if bruising gets worse In the future, if you need an IV catheter or needle to be placed or removed, tell the clinician if you tend to bruise easily or are on medication that may cause bleeding.

## 2024-10-10 NOTE — Other
 Post Anesthesia Transfer of Care NotePatient: Danielle A MaloneProcedure(s) Performed: Procedure(s) (LRB):UPPER GI ENDOSCOPY; DX, W/WO SPECIMEN COLLECTION, BRUSHING/WASHING (SEP PROC) (N/A)COLONOSCOPY, FLEXIBLE; DX, W/WO SPECIMENS/COLON DECOMP (SEP PROC) (N/A)UPPER GI ENDOSCOPY; W/BX, SINGLE/MULTIPLELast Vitals: I have reviewed the post-operative vital signs during the handoff as noted in the Epic chart.POSTOP HANDOFF :      Patient Location:  PACU     Level of Consciousness:  Awake, alert and oriented     VS stable since last recorded intra-op set? Yes       Oxygen source: room airPatient co-morbidities, intra-operative course, intake & output and antibiotics as per Anesthesia record were discussed with the RN.

## 2024-10-10 NOTE — Progress Notes
 Admit Note UpdateUpon review of patient's clinical status, NO changes noted - All parameters same as in recent dictated History and PhysicalLungs ClearHeart Nl S1/S2Abd Soft, non tenderPlanClinically stable to undergo procedure(s) as noted in recent dictated evaluation

## 2024-10-10 NOTE — Anesthesia Pre-Procedure Evaluation
 This is a 68 y.o. female scheduled for UPPER GI ENDOSCOPY; DX, W/WO SPECIMEN COLLECTION, BRUSHING/WASHING (SEP PROC) (Esophagus)COLONOSCOPY, FLEXIBLE; DX, W/WO SPECIMENS/COLON DECOMP (SEP PROC) (Anus).Review of Systems/ Medical History Patient summary, nursing notes, pre-procedure vitals, height, weight and NPO status reviewed.No previous anesthesia concernsAnesthesia Evaluation:   No history of anesthetic complications  Estimated body mass index.10/03/24 : 21.51 kg/m? Last patient weight recorded. 10/03/24 : 68 kg Last patient height recorded. 10/03/24 : 5' 10 (1.778 m) Past Surgical History:  BAND HEMORRHOIDECTOMY	LAPAROSCOPIC SALPINGOOPHERECTOMYCYSTECTOMYLAPAROTOMYPARTIAL COLECTOMY	UPPER GASTROINTESTINAL ENDOSCOPYPEG TUBE PLACEMENTILEOSTOMYURETERAL STENT PLACEMENTCardiovascular: Negative      -Exercise tolerance: >4 METS -Vascular Disease:  Negative    Respiratory:  Negative.HEENT: Negative.Neuromuscular: NegativeSkeletal/Skin:  -Joint and Skeletal Disorders: arthritis-Autoimmune:  She has rheumatoid arthritis.Gastrointestinal/Genitourinary: Gastrointestinal Disorders:  Patient has diverticulitis. Patient has GERD.Hematological/Lymphatic: -Anemia: Patient has anemia.  -Coagulopathy:  Patient has blood dyscrasia.Endocrine/Metabolic: -Diabetes mellitus:  Patient has diabetes mellitus.Behavioral/Social/Psychiatric & Syndromes: NegativePhysical ExamCardiovascular:      Cardiovascular Exam DeferPulmonary:    Exam deferredAirway:  Mallampati: IITM distance: >3 FBNeck ROM: fullMouth Opening: >3cmDental:  unremarkable  Anesthesia PlanASA 2 The primary anesthesia plan is  general. Perioperative Code Status confirmed: It is my understanding that the patient is currently designated as 'Full Code' and will remain so throughout the perioperative period.Anesthesia informed consent obtained.  Consent obtained from: patientType of Anesthesia informed consent obtained:  E-consentThe post operative pain plan is per surgeon management.Anesthesiologist's Pre Op NoteI personally evaluated and examined the patient prior to the intra-operative phase of care on the day of the procedure.SABRA

## 2024-10-20 ENCOUNTER — Other Ambulatory Visit: Admit: 2024-10-20 | Payer: PRIVATE HEALTH INSURANCE | Attending: Nephrology | Primary: Nephrology

## 2024-10-20 DIAGNOSIS — G8220 Paraplegia, unspecified: Secondary | ICD-10-CM

## 2024-10-20 DIAGNOSIS — D649 Anemia, unspecified: Secondary | ICD-10-CM

## 2024-10-20 DIAGNOSIS — M069 Rheumatoid arthritis, unspecified: Principal | ICD-10-CM

## 2024-10-20 LAB — CBC WITHOUT DIFFERENTIAL
BKR WAM ANC (ABSOLUTE NEUTROPHIL COUNT): 2.67 x 1000/ÂµL (ref 2.00–7.60)
BKR WAM HEMATOCRIT: 32 % — ABNORMAL LOW (ref 35.00–45.00)
BKR WAM HEMOGLOBIN: 9.5 g/dL — ABNORMAL LOW (ref 11.7–15.5)
BKR WAM MCH: 27.6 pg (ref 27.0–33.0)
BKR WAM MCHC: 29.7 g/dL — ABNORMAL LOW (ref 31.0–36.0)
BKR WAM MCV: 93 fL (ref 80.0–100.0)
BKR WAM MPV: 10.3 fL (ref 8.0–12.0)
BKR WAM PLATELETS: 295 x1000/ÂµL (ref 150–420)
BKR WAM RDW-CV: 14.1 % (ref 11.0–15.0)
BKR WAM RED BLOOD CELL COUNT.: 3.44 M/ÂµL — ABNORMAL LOW (ref 4.00–6.00)
BKR WAM WHITE BLOOD CELL COUNT: 5.1 x1000/ÂµL (ref 4.0–11.0)

## 2024-11-08 ENCOUNTER — Encounter: Admit: 2024-11-08 | Payer: PRIVATE HEALTH INSURANCE | Attending: Gastroenterology | Primary: Nephrology

## 2024-11-08 DIAGNOSIS — Z932 Ileostomy status: Principal | ICD-10-CM

## 2024-11-18 ENCOUNTER — Other Ambulatory Visit: Admit: 2024-11-18 | Payer: PRIVATE HEALTH INSURANCE | Attending: Nephrology | Primary: Nephrology

## 2024-11-18 ENCOUNTER — Inpatient Hospital Stay: Admit: 2024-11-18 | Discharge: 2024-11-18 | Payer: Medicare (Managed Care) | Attending: Emergency Medicine

## 2024-11-18 DIAGNOSIS — R627 Adult failure to thrive: Principal | ICD-10-CM

## 2024-11-18 LAB — CBC WITH AUTO DIFFERENTIAL
BKR WAM ABSOLUTE IMMATURE GRANULOCYTES.: 0.01 x 1000/ÂµL (ref 0.00–0.30)
BKR WAM ABSOLUTE LYMPHOCYTE COUNT.: 1.46 x 1000/ÂµL (ref 0.60–3.70)
BKR WAM ABSOLUTE NRBC: 0 x 1000/ÂµL (ref 0.00–1.00)
BKR WAM ANC (ABSOLUTE NEUTROPHIL COUNT): 3.39 x 1000/ÂµL (ref 2.00–7.60)
BKR WAM BASOPHIL ABSOLUTE COUNT.: 0.03 x 1000/ÂµL (ref 0.00–1.00)
BKR WAM BASOPHILS: 0.5 % (ref 0.0–1.4)
BKR WAM EOSINOPHIL ABSOLUTE COUNT.: 0.25 x 1000/ÂµL (ref 0.00–1.00)
BKR WAM EOSINOPHILS: 4.3 % (ref 0.0–5.0)
BKR WAM HEMATOCRIT: 34.1 % — ABNORMAL LOW (ref 35.00–45.00)
BKR WAM HEMOGLOBIN: 10.6 g/dL — ABNORMAL LOW (ref 11.7–15.5)
BKR WAM IMMATURE GRANULOCYTES: 0.2 % (ref 0.0–1.0)
BKR WAM LYMPHOCYTES: 25.1 % (ref 17.0–50.0)
BKR WAM MCH: 28.6 pg (ref 27.0–33.0)
BKR WAM MCHC: 31.1 g/dL (ref 31.0–36.0)
BKR WAM MCV: 91.9 fL (ref 80.0–100.0)
BKR WAM MONOCYTE ABSOLUTE COUNT.: 0.68 x 1000/ÂµL (ref 0.00–1.00)
BKR WAM MONOCYTES: 11.7 % (ref 4.0–12.0)
BKR WAM MPV: 10.1 fL (ref 8.0–12.0)
BKR WAM NEUTROPHILS: 58.2 % (ref 39.0–72.0)
BKR WAM NUCLEATED RED BLOOD CELLS: 0 % (ref 0.0–1.0)
BKR WAM PLATELETS: 311 x1000/ÂµL (ref 150–420)
BKR WAM RDW-CV: 14.7 % (ref 11.0–15.0)
BKR WAM RED BLOOD CELL COUNT.: 3.71 M/ÂµL — ABNORMAL LOW (ref 4.00–6.00)
BKR WAM WHITE BLOOD CELL COUNT: 5.8 x1000/ÂµL (ref 4.0–11.0)

## 2024-11-18 NOTE — ED Notes [6]
 11:50 PM Danielle [name] was discharged via [facility]) accompanied by Alone.  Verbalized understanding of discharge instructionsand recommended follow up care as per the after visit summary.  Written discharge instructions provided. Denies any further questions. Vital signs    Vitals:  11/18/24 1655 11/18/24 2340 BP: 104/62 105/63 Pulse: 71 68 Resp: 18 16 Temp: 98.5 ?F (36.9 ?C)  TempSrc: Oral  SpO2: 98% 98% Patient confirmed all belongings returned. Belongings charted in last 7 days:

## 2024-11-18 NOTE — ED Provider Notes [19]
 [facility] Emergency DepartmentChief Complaint:Chief Complaint Patient presents with  Fall greater than 68 years old   BIBA from [facility] after sliding out of wheelchair onto buttocks. Denies head strike/LOC/pain. On eliquis .  YEP:Rmbdujo Danielle [name], 68 y.o., female, presents to the ED today after Danielle slip and fall out of her wheelchair today.  The patient reports reaching for colostomy supplies and unlocked wheelchair slid from under her.  She slid to the floor and onto her buttocks.  She denies head trauma or headache.  She denies any injuries to her extremities and offers no complaints. She was attempting to stand when staff stopped her and told her that she needed to be evaluated in the ER.  Mechanism of injury:  slid out of wheelchairInjuries include:  noneThe patient did not lose consciousness.  The patient was not ambulatory at the scene.The patient was not immobilized PTAED Triage Vitals [11/18/24 1655]BP: 104/62Pulse: 71Pulse from  O2 sat: n/aResp: 18Temp: 98.5 ?F (36.9 ?C)Temp src: OralSpO2: 98 %SECONDARY SURVEY:	General Appear:	alert, healthy, no distress, well nourished, well developed, comfortable, and cooperativeHead: 			normocephalic, atraumaticENT:			pharynx clearEyes: 			Anicteric Neck:			No midline tenderness, no step-offCardiovascular:	regular rate and rhythm, no murmursRespiratory:		clear to auscultation bilaterally, no wheezes, rales, ronchiChest:			non-tender, no deformityBack: 			No thoracolumbar spine tenderness or step offs palpatedAbdomen:		soft, non-tender, non-distendedMusculoskeletal:	Extremities non-tender and FROM all w/o pain elicitedSkin:			dry, no rash, intactNeurologic:		awake, alert, oriented x 3 and moves all 4 extremities equally with muscle strength 5/5 bilaterally equalThe NEXUS Clinical Criteria for Cervical Spine Imaging:   1. Midline spinal tenderness present:  no 2. Focal neurologic deficit:                  no 3. Altered level of consciousness:      no 4. Evidence of intoxication:                 no 5. Distracting injury that might distract the patient from the pain of Danielle cervical fracture:    noIf any of the responses above are yes, the C-spine cannot be clinically cleared. Danielle cervical spine collar, if not already in place, should be applied and imaging of the cervical spine obtained.Based upon the NEXUS criteria, Cspine imaging is not indicated.I cleared the patient's cervical spine and removed the Cspine collar at: [facility] Medical History:Past Medical History[1]Past Surgical History[2]Social History:Social History[3]Family History:No family history on file.Medications: Medication List  ASK your doctor about these medications  acetaminophen  325 mg tabletCommonly known as: TYLENOL  apixaban  5 mg Tab tabletCommonly known as: ELIQUIS  estradioL  0.01 % vaginal creamCommonly known as: ESTRACEPlace 2 g vaginally daily. ferrous gluconate  324 mg (38 mg iron ) tabletCommonly known as: FERGON folic acid  1 mg tabletCommonly known as: FOLVITETake 1 tablet (1 mg total) by mouth daily. gabapentin  400 mg capsuleCommonly known as: NEURONTIN  magnesium  hydroxide 400 mg/5 mL suspensionCommonly known as: MILK OF MAGNESIA menthol -zinc  oxide 0.44-20.6 % ointmentCommonly known as: CALMOSEPTIN methenamine  hippurate 1 gram tabletCommonly known as: HIPREXTake 1 tablet (1 g total) by mouth 2 (two) times daily. pantoprazole  20 mg tabletCommonly known as: PROTONIX  polyethylene glycol 17 gram packetCommonly known as: MIRALAXTake 1 packet (17 g total) by mouth daily. Mix in 8 ounces of water , juice, soda, coffee or tea prior to taking. potassium citrate  15 mEq extended release tabletCommonly known as: UROCIT-K  rosuvastatin  10 mg tabletCommonly known as: CRESTOR  traMADoL  50 mg tabletCommonly known as: ULTRAM  ZINC  OXIDE TOP  Allergies as of 11/18/2024  (No Known Allergies) Immunizations:Immunization History Administered Date(s) Administered  Influenza, high dose, quad, 0.7 mL, preservative free 03/01/2022  Medical Decision Making:Laboratory studies: No  results found for this visit on 11/18/24.Radiologic Imaging studies: Portable films (cxr and/or pelvis) performed in the trauma room were interpreted by me at the time of xray exposure: Not applicableNursing notes were reviewed: YesI independently interpreted labs and plain films: Not applicableDiscussed patient with another provider: NoSummary of ED Vital Signs: Vitals:  11/18/24 1655 BP: 104/62 Pulse: 71 Resp: 18 Temp: 98.5 ?F (36.9 ?C) TempSrc: Oral SpO2: 98% 	ED Course/Assessment/Plan:Medical Decision MakingPatient slid out of her wheelchair and onto her buttocks today.  No head trauma or other injuries sustained.  No complaints offered by the patient at time of examination. Patient is quite upset that staff sent her to the ER in the first place.  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)   Dysphagia   Encephalopathy   ESBL (extended spectrum beta-lactamase) producing bacteria infection   GERD (gastroesophageal reflux disease)   Hypercalcemia   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)   Incontinent of urine   Kidney stone   Paraplegia (HC CODE)   Rheumatoid arthritis (HC Code)  (HC CODE)  [2] Past Surgical History:Procedure Laterality Date  BAND HEMORRHOIDECTOMY    CYSTECTOMY  1972  ILEOSTOMY    LAPAROSCOPIC SALPINGOOPHERECTOMY  1986  LAPAROTOMY  2022  PARTIAL COLECTOMY  2022  PEG TUBE PLACEMENT  01/2022  UPPER GASTROINTESTINAL ENDOSCOPY    URETERAL STENT PLACEMENT Left  [3] Social HistoryTobacco Use  Smoking status: Never  Smokeless tobacco: Never Vaping Use  Vaping status: Never Used Substance Use Topics  Alcohol  use: Not Currently  Drug use: Never  Leretha Fallow, DO11/28/25 1814

## 2024-11-18 NOTE — Discharge Instructions [18]
 If you develop headache, vomiting, pain to your legs or lower back-return to the ER immediately.

## 2024-11-18 NOTE — ED Notes [6]
 6:40 PM Waiting on chair car for ride back to [facility].

## 2024-11-19 ENCOUNTER — Encounter: Admit: 2024-11-19 | Payer: PRIVATE HEALTH INSURANCE | Primary: Nephrology

## 2024-11-21 ENCOUNTER — Encounter: Admit: 2024-11-21 | Payer: PRIVATE HEALTH INSURANCE | Attending: Colon & Rectal Surgery | Primary: Nephrology

## 2024-11-21 ENCOUNTER — Ambulatory Visit: Admit: 2024-11-21 | Payer: PRIVATE HEALTH INSURANCE | Attending: Colon & Rectal Surgery | Primary: Nephrology

## 2024-11-21 VITALS — BP 110/62 | HR 71 | Ht 70.0 in | Wt 149.9 lb

## 2024-11-21 DIAGNOSIS — Z932 Ileostomy status: Secondary | ICD-10-CM

## 2024-11-21 DIAGNOSIS — G9340 Encephalopathy, unspecified: Principal | ICD-10-CM

## 2024-11-21 DIAGNOSIS — D649 Anemia, unspecified: Secondary | ICD-10-CM

## 2024-11-21 DIAGNOSIS — R1310 Dysphagia, unspecified: Secondary | ICD-10-CM

## 2024-11-21 DIAGNOSIS — E099 Drug or chemical induced diabetes mellitus without complications: Secondary | ICD-10-CM

## 2024-11-21 DIAGNOSIS — R627 Adult failure to thrive: Secondary | ICD-10-CM

## 2024-11-21 DIAGNOSIS — G8220 Paraplegia, unspecified: Secondary | ICD-10-CM

## 2024-11-21 DIAGNOSIS — I82419 Acute embolism and thrombosis of unspecified femoral vein: Secondary | ICD-10-CM

## 2024-11-21 DIAGNOSIS — A499 Bacterial infection, unspecified: Secondary | ICD-10-CM

## 2024-11-21 DIAGNOSIS — L03115 Cellulitis of right lower limb: Secondary | ICD-10-CM

## 2024-11-21 DIAGNOSIS — K5792 Diverticulitis of intestine, part unspecified, without perforation or abscess without bleeding: Secondary | ICD-10-CM

## 2024-11-21 DIAGNOSIS — K219 Gastro-esophageal reflux disease without esophagitis: Secondary | ICD-10-CM

## 2024-11-21 DIAGNOSIS — M069 Rheumatoid arthritis, unspecified: Secondary | ICD-10-CM

## 2024-11-21 DIAGNOSIS — R32 Unspecified urinary incontinence: Secondary | ICD-10-CM

## 2024-11-21 DIAGNOSIS — N200 Calculus of kidney: Secondary | ICD-10-CM

## 2024-11-29 ENCOUNTER — Inpatient Hospital Stay: Admit: 2024-11-29 | Discharge: 2024-11-29 | Payer: PRIVATE HEALTH INSURANCE | Primary: Nephrology

## 2024-11-29 ENCOUNTER — Ambulatory Visit: Admit: 2024-11-29 | Payer: PRIVATE HEALTH INSURANCE | Attending: Medical Oncology | Primary: Nephrology

## 2024-11-29 VITALS — BP 108/72 | HR 67 | Temp 97.70000°F | Resp 18 | Ht 70.0 in | Wt 161.0 lb

## 2024-11-29 DIAGNOSIS — Z7901 Long term (current) use of anticoagulants: Secondary | ICD-10-CM

## 2024-11-29 DIAGNOSIS — D508 Other iron deficiency anemias: Secondary | ICD-10-CM

## 2024-11-29 LAB — COMPREHENSIVE METABOLIC PANEL
BKR A/G RATIO: 0.6
BKR ALANINE AMINOTRANSFERASE (ALT): 11 U/L — ABNORMAL LOW (ref 12–78)
BKR ALBUMIN: 3 g/dL — ABNORMAL LOW (ref 3.4–5.0)
BKR ALKALINE PHOSPHATASE: 108 U/L (ref 20–120)
BKR ANION GAP: 5 (ref 3–18)
BKR ASPARTATE AMINOTRANSFERASE (AST): 10 U/L (ref 5–37)
BKR AST/ALT RATIO: 0.9
BKR BILIRUBIN TOTAL: 0.2 mg/dL (ref 0.0–1.0)
BKR BLOOD UREA NITROGEN: 9 mg/dL (ref 8–25)
BKR BUN / CREAT RATIO: 9.9 (ref 8.0–25.0)
BKR CALCIUM: 10.1 mg/dL (ref 8.4–10.3)
BKR CHLORIDE: 112 mmol/L (ref 95–115)
BKR CO2: 25 mmol/L (ref 21–32)
BKR CREATININE DELTA: -0.03
BKR CREATININE: 0.91 mg/dL (ref 0.50–1.30)
BKR EGFR, CREATININE (CKD-EPI 2021): >60 mL/min/1.73m2 (ref >=60)
BKR GLOBULIN: 4.7 g/dL
BKR GLUCOSE: 104 mg/dL — ABNORMAL HIGH (ref 70–100)
BKR OSMOLALITY CALCULATION: 282 mosm/kg (ref 275–295)
BKR POTASSIUM: 3.8 mmol/L (ref 3.5–5.1)
BKR PROTEIN TOTAL: 7.7 g/dL (ref 6.4–8.2)
BKR SODIUM: 142 mmol/L (ref 136–145)

## 2024-11-29 LAB — CBC WITHOUT DIFFERENTIAL
BKR WAM ANC (ABSOLUTE NEUTROPHIL COUNT): 3.1 x 1000/ÂµL (ref 2.00–7.60)
BKR WAM HEMATOCRIT: 36.4 % (ref 35.00–45.00)
BKR WAM HEMOGLOBIN: 11.3 g/dL — ABNORMAL LOW (ref 11.7–15.5)
BKR WAM MCH: 28.4 pg (ref 27.0–33.0)
BKR WAM MCHC: 31 g/dL (ref 31.0–36.0)
BKR WAM MCV: 91.5 fL (ref 80.0–100.0)
BKR WAM MPV: 9.9 fL (ref 8.0–12.0)
BKR WAM PLATELETS: 252 x1000/ÂµL (ref 150–420)
BKR WAM RDW-CV: 14.6 % (ref 11.0–15.0)
BKR WAM RED BLOOD CELL COUNT.: 3.98 M/ÂµL — ABNORMAL LOW (ref 4.00–6.00)
BKR WAM WHITE BLOOD CELL COUNT: 5.1 x1000/ÂµL (ref 4.0–11.0)

## 2024-11-29 LAB — IRON AND TIBC
BKR IRON SATURATION (SCCCT BH GH LM): 14 % (ref 11–46)
BKR IRON: 40 ug/dL (ref 40–150)
BKR TOTAL IRON BINDING CAPACITY (SCCCT  BH GH LM): 283 ug/dL (ref 250–450)

## 2024-11-29 LAB — D-DIMER, QUANTITATIVE: BKR D-DIMER: 3.7 mg{FEU}/L — ABNORMAL HIGH (ref <0.49)

## 2024-11-29 LAB — FERRITIN: BKR FERRITIN: 21 ng/mL (ref 8–252)

## 2024-11-29 NOTE — Progress Notes [1]
 Hematology Oncology Note     November 29, 2024  Danielle [name] is a 68 y.o. female with a history of DVT and anemia secondary to blood lossHPIIn November 2022, she was admitted to a hospital in [place]  with hemoperitoneum, due to a diverticular perforation.  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  She also developed encephalopathy.  She was eventually transferred to a skilled nursing facility on 02/05/2022.  Thereafter, patient was brought to [facility] on 02/11/2022 for management of encephalopathy.  She had a six-month hospitalization.  She was discharged to another skilled nursing care facility on 08/14/2022 with instructions to follow-up with a hematologist about the duration of anticoagulation therapy. She was then admitted to [facility] in March of 2024 again with altered mental status secondary to sepsis.  She was treated for UTI/sepsis and discharged back to skilled nursing care facility within a week. On 05/24/2023, she presented to [facility] with blood from the stoma.Doppler ultrasound on 05/24/2023 showed that she have a new partially occlusive thrombus in the right popliteal vein. Blood test on 05/24/2023 was consistent with iron  deficiencyHemoglobin was reduced at 8.7She received 1 g of iron  dextran on 06/03/2024At the time of her initial hematologic consultation on 05/26/2023, I felt that she was at an increased risk of future thrombus and I recommended that she remain on the anticoagulant therapy indefinitely - until her condition changes.  INTERIM HISTORY:On 11/18/2024, she was evaluated in [facility] ED following a fallShe stated that she slept from her wheelchair and fell on her buttocksED in the evaluation was essentially negativeHowever, the anemia was noted to be worseShe remains on EliquisShe has been taking ironShe had gained some weightShe has not had any leg swelling or painPAST MEDICAL HISTORY:Rheumatoid arthritisDiverticular perforation with abscess-November 2022Recurrent sepsisAnemia/iron  deficiencyMedications Ordered Prior to Encounter[1] SOCIAL HISTORY:Never smokedNever consumed alcohol  regularlyFAMILY HISTORY:No known family history of malignancies Allergies[2] Review of Systems  Objective:  BP 108/72 (Site: l a, Position: Sitting, Cuff Size: Medium)  - Pulse 67  - Temp 97.7 ?F (36.5 ?C) (No-touch scanner)  - Resp 18  - Ht 5' 10 (1.778 m)  - Wt 73 kg  - SpO2 97%  - BMI 23.10 kg/m? Physical ExamRight calf 34 cm; no tendernessLeft calf 32 cm; no tenderness Latest Ref Rng 08/18/2024 09/19/2024 10/04/2024 10/20/2024 11/18/2024 11/29/2024 Labs        Hemoglobin 11.7 - 15.5 g/dL 88.6 (L)  89.3 (L)  89.2 (L)  9.5 (L)  10.6 (L)  11.3 (L)  Hematocrit 35.00 - 45.00 % 36.90  35.40  34.90 (L)  32.00 (L)  34.10 (L)  36.40  WBC 4.0 - 11.0 x1000/?L 5.3  4.9  4.4  5.1  5.8  5.1  Platelets 150 - 420 x1000/?L 320  290  244  295  311  252   Legend:(L) Low  Assessment / Plan:  History of DVT Questionable New right popliteal DVT-05/24/2023 Anemia due to blood loss Her calf exams are unchangedShe has slight right calf edema-without changeHemoglobin is slightly higherIron panel is pendingShe was advised to continue with Eliquis  5 mg b.i.d.She was advised to continue with ironShe will return for another follow-up blood test and a visit in 6 monthsPlease note: MModal speech recognition software was used to compose this note. Notes are reviewed for accuracy but unintended translational errors can occur. Please contact me if there are any questions about the content of this note.Orders Placed This Encounter  Procedures  Iron , TIBC and ferritin panel (BH GH LMW Q YH)  CBC and differential Comprehensive metabolic panel  Return Visit with Labs (peripheral draw)  Electronically Signed by Anthon Robinette Ruth, MD, November 29, 2024  [1] Current Outpatient Medications on File Prior to Visit Medication Sig Dispense Refill  acetaminophen  (TYLENOL ) 325 mg tablet Take 3 tablets (975 mg total) by mouth every 8 (eight) hours as needed for pain.    apixaban  (ELIQUIS ) 5 mg tablet Take 1 tablet (5 mg total) by mouth every 12 (twelve) hours.    estradioL  (ESTRACE ) 0.01 % vaginal cream Place 2 g vaginally daily. 42.5 g 12  ferrous gluconate  (FERGON) 324 mg (38 mg iron ) tablet Take 1 tablet (324 mg total) by mouth daily.    folic acid  (FOLVITE ) 1 mg tablet Take 1 tablet (1 mg total) by mouth daily. 30 tablet 11  gabapentin  (NEURONTIN ) 400 mg capsule Take 1 capsule (400 mg total) by mouth 2 (two) times daily with breakfast and dinner.    magnesium  hydroxide (MILK OF MAGNESIA) 400 mg/5 mL suspension Take 30 mLs by mouth daily as needed for constipation.    menthol -zinc  oxide (CALMOSEPTIN) 0.44-20.6 % ointment Apply topically every 8 (eight) hours. To Perineum, labia, buttocks    methenamine  (HIPREX ) 1 gram tablet Take 1 tablet (1 g total) by mouth 2 (two) times daily. 60 tablet 2  pantoprazole  (PROTONIX ) 20 mg tablet     polyethylene glycol (MIRALAX ) 17 gram packet Take 1 packet (17 g total) by mouth daily. Mix in 8 ounces of water , juice, soda, coffee or tea prior to taking. 14 each 2  potassium citrate  (UROCIT-K ) 15 mEq extended release tablet Take 1 tablet (15 mEq total) by mouth daily.    rosuvastatin  (CRESTOR ) 10 mg tablet Take 1 tablet (10 mg total) by mouth every evening.    traMADoL  (ULTRAM ) 50 mg tablet Take 1 tablet (50 mg total) by mouth every 8 (eight) hours as needed for pain.    ZINC  OXIDE TOP Apply topically every 8 (eight) hours. To buttocks   No current facility-administered medications on file prior to visit. [2] No Known Allergies

## 2024-12-19 ENCOUNTER — Other Ambulatory Visit: Admit: 2024-12-19 | Payer: PRIVATE HEALTH INSURANCE | Attending: Nephrology | Primary: Nephrology

## 2024-12-19 DIAGNOSIS — M6281 Muscle weakness (generalized): Principal | ICD-10-CM

## 2024-12-19 LAB — CBC WITHOUT DIFFERENTIAL
BKR WAM ANC (ABSOLUTE NEUTROPHIL COUNT): 2.73 x 1000/ÂµL (ref 2.00–7.60)
BKR WAM HEMATOCRIT: 34.8 % — ABNORMAL LOW (ref 35.00–45.00)
BKR WAM HEMOGLOBIN: 10.4 g/dL — ABNORMAL LOW (ref 11.7–15.5)
BKR WAM MCH: 27.3 pg (ref 27.0–33.0)
BKR WAM MCHC: 29.9 g/dL — ABNORMAL LOW (ref 31.0–36.0)
BKR WAM MCV: 91.3 fL (ref 80.0–100.0)
BKR WAM MPV: 10.2 fL (ref 8.0–12.0)
BKR WAM PLATELETS: 367 x1000/ÂµL (ref 150–420)
BKR WAM RDW-CV: 14.6 % (ref 11.0–15.0)
BKR WAM RED BLOOD CELL COUNT.: 3.81 M/ÂµL — ABNORMAL LOW (ref 4.00–6.00)
BKR WAM WHITE BLOOD CELL COUNT: 5.2 x1000/ÂµL (ref 4.0–11.0)

## 2025-01-10 ENCOUNTER — Other Ambulatory Visit: Admit: 2025-01-10 | Payer: Medicare (Managed Care) | Attending: Nephrology | Primary: Nephrology

## 2025-01-10 DIAGNOSIS — R3129 Other microscopic hematuria: Principal | ICD-10-CM

## 2025-01-10 LAB — URINALYSIS-MACROSCOPIC W/REFLEX MICROSCOPIC
BKR BILIRUBIN, UA: NEGATIVE
BKR GLUCOSE, UA: NEGATIVE
BKR KETONES, UA: NEGATIVE
BKR NITRITE, UA: POSITIVE — AB
BKR PH, UA: 6 (ref 5.5–7.5)
BKR SPECIFIC GRAVITY, UA: 1.021 (ref 1.005–1.030)
BKR UROBILINOGEN, UA: <2 mg/dL (ref <=2.0)

## 2025-01-10 LAB — URINE MICROSCOPIC     (BH GH LMW YH)
BKR RBC/HPF, UA (INSTRUMENT): 468 /HPF — ABNORMAL HIGH (ref 0–2)
BKR URINE SQUAMOUS EPITHELIAL CELLS, UA (INSTRUMENT): <1 /HPF (ref 0–5)
BKR WBC/HPF, UA (INSTRUMENT): 39 /HPF — ABNORMAL HIGH (ref 0–5)

## 2025-01-13 LAB — URINE CULTURE

## 2025-01-20 ENCOUNTER — Other Ambulatory Visit: Admit: 2025-01-20 | Payer: Medicare (Managed Care) | Attending: Nephrology | Primary: Nephrology

## 2025-01-20 DIAGNOSIS — G8220 Paraplegia, unspecified: Principal | ICD-10-CM

## 2025-01-20 LAB — URINE MICROSCOPIC     (BH GH LMW YH)
BKR RBC/HPF, UA (INSTRUMENT): 2 /HPF (ref 0–2)
BKR URINE SQUAMOUS EPITHELIAL CELLS, UA (INSTRUMENT): 1 /HPF (ref 0–5)
BKR WBC/HPF, UA (INSTRUMENT): 18 /HPF — ABNORMAL HIGH (ref 0–5)

## 2025-01-20 LAB — URINALYSIS-MACROSCOPIC W/REFLEX MICROSCOPIC
BKR BILIRUBIN, UA: NEGATIVE
BKR BLOOD, UA: NEGATIVE
BKR GLUCOSE, UA: NEGATIVE
BKR KETONES, UA: NEGATIVE
BKR NITRITE, UA: NEGATIVE
BKR PH, UA: 6 (ref 5.5–7.5)
BKR PROTEIN, UA: NEGATIVE
BKR SPECIFIC GRAVITY, UA: 1.018 (ref 1.005–1.030)
BKR UROBILINOGEN, UA: <2 mg/dL (ref <=2.0)

## 2025-01-23 ENCOUNTER — Emergency Department: Admit: 2025-01-23 | Payer: Medicare (Managed Care) | Primary: Nephrology

## 2025-01-23 ENCOUNTER — Encounter
Admit: 2025-01-23 | Payer: PRIVATE HEALTH INSURANCE | Attending: Vascular and Interventional Radiology | Primary: Nephrology

## 2025-01-23 ENCOUNTER — Inpatient Hospital Stay
Admission: EM | Admit: 2025-01-23 | Discharge: 2025-01-25 | Payer: Medicare (Managed Care) | Attending: Internal Medicine | Admitting: Internal Medicine

## 2025-01-23 DIAGNOSIS — N3000 Acute cystitis without hematuria: Principal | ICD-10-CM

## 2025-01-23 LAB — CBC WITH AUTO DIFFERENTIAL
BKR WAM ABSOLUTE IMMATURE GRANULOCYTES.: 0.04 x 1000/ÂµL (ref 0.00–0.30)
BKR WAM ABSOLUTE LYMPHOCYTE COUNT.: 1.44 x 1000/ÂµL (ref 0.60–3.70)
BKR WAM ABSOLUTE NRBC: 0 x 1000/ÂµL (ref 0.00–1.00)
BKR WAM ANC (ABSOLUTE NEUTROPHIL COUNT): 6.47 x 1000/ÂµL (ref 2.00–7.60)
BKR WAM BASOPHIL ABSOLUTE COUNT.: 0.04 x 1000/ÂµL (ref 0.00–1.00)
BKR WAM BASOPHILS: 0.4 % (ref 0.0–1.4)
BKR WAM EOSINOPHIL ABSOLUTE COUNT.: 0.31 x 1000/ÂµL (ref 0.00–1.00)
BKR WAM EOSINOPHILS: 3.3 % (ref 0.0–5.0)
BKR WAM HEMATOCRIT: 37.5 % (ref 35.00–45.00)
BKR WAM HEMOGLOBIN: 11.3 g/dL — ABNORMAL LOW (ref 11.7–15.5)
BKR WAM IMMATURE GRANULOCYTES: 0.4 % (ref 0.0–1.0)
BKR WAM LYMPHOCYTES: 15.5 % — ABNORMAL LOW (ref 17.0–50.0)
BKR WAM MCH: 27 pg (ref 27.0–33.0)
BKR WAM MCHC: 30.1 g/dL — ABNORMAL LOW (ref 31.0–36.0)
BKR WAM MCV: 89.7 fL (ref 80.0–100.0)
BKR WAM MONOCYTE ABSOLUTE COUNT.: 0.97 x 1000/ÂµL (ref 0.00–1.00)
BKR WAM MONOCYTES: 10.5 % (ref 4.0–12.0)
BKR WAM MPV: 9.6 fL (ref 8.0–12.0)
BKR WAM NEUTROPHILS: 69.9 % (ref 39.0–72.0)
BKR WAM NUCLEATED RED BLOOD CELLS: 0 % (ref 0.0–1.0)
BKR WAM PLATELETS: 333 10*3/uL (ref 150–420)
BKR WAM RDW-CV: 14.6 % (ref 11.0–15.0)
BKR WAM RED BLOOD CELL COUNT.: 4.18 M/ÂµL (ref 4.00–6.00)
BKR WAM WHITE BLOOD CELL COUNT: 9.3 10*3/uL (ref 4.0–11.0)

## 2025-01-23 LAB — COMPREHENSIVE METABOLIC PANEL
BKR A/G RATIO: 0.7 — ABNORMAL LOW (ref 1.0–2.2)
BKR ALANINE AMINOTRANSFERASE (ALT): <5 U/L (ref <=35)
BKR ALBUMIN: 3.8 g/dL (ref 3.6–5.1)
BKR ALKALINE PHOSPHATASE: 101 U/L (ref 35–104)
BKR ANION GAP: 10 (ref 7–17)
BKR ASPARTATE AMINOTRANSFERASE (AST): 14 U/L (ref <35)
BKR BILIRUBIN TOTAL: 0.2 mg/dL (ref <=1.2)
BKR BLOOD UREA NITROGEN: 11 mg/dL (ref 8–23)
BKR BUN / CREAT RATIO: 12.6 (ref 8.0–23.0)
BKR CALCIUM: 10.4 mg/dL — ABNORMAL HIGH (ref 8.8–10.2)
BKR CHLORIDE: 104 mmol/L (ref 98–107)
BKR CO2: 24 mmol/L (ref 20–30)
BKR CREATININE DELTA: -0.04
BKR CREATININE: 0.87 mg/dL (ref 0.51–0.95)
BKR EGFR, CREATININE (CKD-EPI 2021): >60 mL/min/{1.73_m2} (ref >=60)
BKR GLOBULIN: 5.1 g/dL — ABNORMAL HIGH (ref 1.9–3.9)
BKR GLUCOSE: 97 mg/dL (ref 70–140)
BKR POTASSIUM: 4 mmol/L (ref 3.3–5.5)
BKR PROTEIN TOTAL: 8.9 g/dL — ABNORMAL HIGH (ref 5.9–8.3)
BKR SODIUM: 138 mmol/L (ref 136–145)

## 2025-01-23 LAB — PROTIME AND INR
BKR INR: 1.07 (ref 0.90–1.10)
BKR PROTHROMBIN TIME: 11.1 s (ref 9.0–12.0)

## 2025-01-23 LAB — C-REACTIVE PROTEIN (CRP) HIGH SENSITIVITY: BKR C-REACTIVE PROTEIN, HIGH SENSITIVITY: 35.1 mg/L — ABNORMAL HIGH

## 2025-01-23 LAB — URINE CULTURE

## 2025-01-23 LAB — LACTIC ACID, PLASMA (REFLEX 2H REPEAT): BKR LACTATE: 1.6 mmol/L (ref 0.4–2.0)

## 2025-01-23 MED ORDER — ERTAPENEM 1000 MG MBP
INTRAVENOUS | Status: DC
Start: 2025-01-23 — End: 2025-01-26
  Administered 2025-01-24 (×2): 50.000 mL/h via INTRAVENOUS

## 2025-01-23 MED ORDER — SODIUM CHLORIDE 0.9 % IV BOLUS NEW BAG (DROPS CHARGE)
0.9 | Freq: Once | INTRAVENOUS | Status: CP
Start: 2025-01-23 — End: ?
  Administered 2025-01-23: 0.9 mL/h via INTRAVENOUS

## 2025-01-24 LAB — CBC WITH AUTO DIFFERENTIAL
BKR WAM ABSOLUTE IMMATURE GRANULOCYTES.: 0.02 x 1000/ÂµL (ref 0.00–0.30)
BKR WAM ABSOLUTE LYMPHOCYTE COUNT.: 1.25 x 1000/ÂµL (ref 0.60–3.70)
BKR WAM ABSOLUTE NRBC: 0 x 1000/ÂµL (ref 0.00–1.00)
BKR WAM ANC (ABSOLUTE NEUTROPHIL COUNT): 3.48 x 1000/ÂµL (ref 2.00–7.60)
BKR WAM BASOPHIL ABSOLUTE COUNT.: 0.04 x 1000/ÂµL (ref 0.00–1.00)
BKR WAM BASOPHILS: 0.7 % (ref 0.0–1.4)
BKR WAM EOSINOPHIL ABSOLUTE COUNT.: 0.34 x 1000/ÂµL (ref 0.00–1.00)
BKR WAM EOSINOPHILS: 5.6 % — ABNORMAL HIGH (ref 0.0–5.0)
BKR WAM HEMATOCRIT: 31.5 % — ABNORMAL LOW (ref 35.00–45.00)
BKR WAM HEMOGLOBIN: 9.6 g/dL — ABNORMAL LOW (ref 11.7–15.5)
BKR WAM IMMATURE GRANULOCYTES: 0.3 % (ref 0.0–1.0)
BKR WAM LYMPHOCYTES: 20.7 % (ref 17.0–50.0)
BKR WAM MCH: 26.8 pg — ABNORMAL LOW (ref 27.0–33.0)
BKR WAM MCHC: 30.5 g/dL — ABNORMAL LOW (ref 31.0–36.0)
BKR WAM MCV: 88 fL (ref 80.0–100.0)
BKR WAM MONOCYTE ABSOLUTE COUNT.: 0.9 x 1000/ÂµL (ref 0.00–1.00)
BKR WAM MONOCYTES: 14.9 % — ABNORMAL HIGH (ref 4.0–12.0)
BKR WAM MPV: 9.8 fL (ref 8.0–12.0)
BKR WAM NEUTROPHILS: 57.8 % (ref 39.0–72.0)
BKR WAM NUCLEATED RED BLOOD CELLS: 0 % (ref 0.0–1.0)
BKR WAM PLATELETS: 289 10*3/uL (ref 150–420)
BKR WAM RDW-CV: 14.6 % (ref 11.0–15.0)
BKR WAM RED BLOOD CELL COUNT.: 3.58 M/ÂµL — ABNORMAL LOW (ref 4.00–6.00)
BKR WAM WHITE BLOOD CELL COUNT: 6 10*3/uL (ref 4.0–11.0)

## 2025-01-24 LAB — BASIC METABOLIC PANEL
BKR ANION GAP: 8 (ref 7–17)
BKR BLOOD UREA NITROGEN: 13 mg/dL (ref 8–23)
BKR BUN / CREAT RATIO: 15.5 (ref 8.0–23.0)
BKR CALCIUM: 9.6 mg/dL (ref 8.8–10.2)
BKR CHLORIDE: 106 mmol/L (ref 98–107)
BKR CO2: 23 mmol/L (ref 20–30)
BKR CREATININE DELTA: -0.03
BKR CREATININE: 0.84 mg/dL (ref 0.51–0.95)
BKR EGFR, CREATININE (CKD-EPI 2021): >60 mL/min/{1.73_m2} (ref >=60)
BKR GLUCOSE: 98 mg/dL (ref 70–140)
BKR POTASSIUM: 4.1 mmol/L (ref 3.3–5.5)
BKR SODIUM: 137 mmol/L (ref 136–145)

## 2025-01-24 LAB — URINE MICROSCOPIC     (BH GH LMW YH)
BKR RBC/HPF, UA (INSTRUMENT): 4 /HPF — ABNORMAL HIGH (ref 0–2)
BKR URINE SQUAMOUS EPITHELIAL CELLS, UA (INSTRUMENT): <1 /HPF (ref 0–5)
BKR WBC/HPF, UA (INSTRUMENT): 115 /HPF — ABNORMAL HIGH (ref 0–5)

## 2025-01-24 LAB — SARS-COV-2 (COVID-19)/INFLUENZA A+B/RSV BY RT-PCR (BH GH LMW YH)
BKR INFLUENZA A: NEGATIVE
BKR INFLUENZA B: NEGATIVE
BKR RESPIRATORY SYNCYTIAL VIRUS: NEGATIVE
BKR SARS-COV-2 RNA (COVID-19) (YH): NEGATIVE

## 2025-01-24 LAB — RESPIRATORY VIRUS PCR PANEL     (BH GH LMW Q YH)
BKR ADENOVIRUS: NOT DETECTED
BKR HUMAN METAPNEUMOVIRUS (HMPV): NOT DETECTED
BKR PARAINFLUENZA VIRUS 1: NOT DETECTED
BKR PARAINFLUENZA VIRUS 2: NOT DETECTED
BKR PARAINFLUENZA VIRUS 3: NOT DETECTED
BKR PARAINFLUENZA VIRUS 4: NOT DETECTED
BKR RHINOVIRUS: NOT DETECTED
BKR SARS-COV-2 RNA (COVID-19) (YH): NEGATIVE

## 2025-01-24 LAB — URINALYSIS WITH CULTURE REFLEX      (BH LMW YH)
BKR BILIRUBIN, UA: NEGATIVE
BKR BLOOD, UA: NEGATIVE
BKR GLUCOSE, UA: NEGATIVE
BKR KETONES, UA: NEGATIVE
BKR NITRITE, UA: NEGATIVE
BKR PH, UA: 6.5 (ref 5.5–7.5)
BKR SPECIFIC GRAVITY, UA: 1.027 (ref 1.005–1.030)
BKR UROBILINOGEN, UA: <2 mg/dL (ref <=2.0)

## 2025-01-24 LAB — UA REFLEX CULTURE

## 2025-01-24 MED ORDER — FERROUS GLUCONATE 324 MG (38 MG IRON) TABLET
324 | Freq: Two times a day (BID) | ORAL | Status: AC
Start: 2025-01-24 — End: ?

## 2025-01-24 MED ORDER — SODIUM CHLORIDE 0.9 % (FLUSH) INJECTION SYRINGE
0.9 | Freq: Three times a day (TID) | INTRAVENOUS | Status: DC
Start: 2025-01-24 — End: 2025-01-26
  Administered 2025-01-24 – 2025-01-25 (×6): 0.9 mL via INTRAVENOUS

## 2025-01-24 MED ORDER — TRAZODONE 50 MG TABLET
50 | Freq: Every evening | ORAL | Status: DC
Start: 2025-01-24 — End: 2025-01-25

## 2025-01-24 MED ORDER — FOLIC ACID 1 MG TABLET
1 | Freq: Every day | ORAL | Status: DC
Start: 2025-01-24 — End: 2025-01-26
  Administered 2025-01-24 – 2025-01-25 (×2): 1 mg via ORAL

## 2025-01-24 MED ORDER — ROSUVASTATIN 10 MG TABLET
10 | Freq: Every evening | ORAL | Status: DC
Start: 2025-01-24 — End: 2025-01-26
  Administered 2025-01-24 – 2025-01-25 (×2): 10 mg via ORAL

## 2025-01-24 MED ORDER — SODIUM CHLORIDE 0.9 % (FLUSH) INJECTION SYRINGE
0.9 | INTRAVENOUS | Status: DC | PRN
Start: 2025-01-24 — End: 2025-01-26

## 2025-01-24 MED ORDER — DEXTROMETHORPHAN-GUAIFENESIN 10 MG-100 MG/5 ML ORAL SYRUP
10-100 | Freq: Four times a day (QID) | ORAL | Status: DC
Start: 2025-01-24 — End: 2025-01-26
  Administered 2025-01-24 – 2025-01-25 (×6): 10-100 mL via ORAL

## 2025-01-24 MED ORDER — ONDANSETRON 4 MG DISINTEGRATING TABLET
4 | Freq: Four times a day (QID) | ORAL | Status: DC | PRN
Start: 2025-01-24 — End: 2025-01-26

## 2025-01-24 MED ORDER — ASCORBIC ACID (VITAMIN C) 500 MG TABLET
500 | Freq: Every day | ORAL | Status: AC
Start: 2025-01-24 — End: ?

## 2025-01-24 MED ORDER — SODIUM CHLORIDE 0.65 % NASAL SPRAY AEROSOL
0.65 | Freq: Two times a day (BID) | NASAL | Status: DC | PRN
Start: 2025-01-24 — End: 2025-01-26
  Administered 2025-01-24 – 2025-01-25 (×2): 0.65 mL via NASAL

## 2025-01-24 MED ORDER — ZZ IMS TEMPLATE
Freq: Two times a day (BID) | ORAL | Status: DC
Start: 2025-01-24 — End: 2025-01-26
  Administered 2025-01-24 – 2025-01-25 (×4): 400 mg via ORAL

## 2025-01-24 MED ORDER — ZINC OXIDE-COD LIVER OIL 40 % TOPICAL PASTE
40 | Freq: Two times a day (BID) | TOPICAL | Status: DC | PRN
Start: 2025-01-24 — End: 2025-01-26

## 2025-01-24 MED ORDER — ERTAPENEM 1000 MG MBP
INTRAVENOUS | Status: DC
Start: 2025-01-24 — End: 2025-01-24

## 2025-01-24 MED ORDER — ASCORBIC ACID (VITAMIN C) 500 MG TABLET
500 | Freq: Every day | ORAL | Status: DC
Start: 2025-01-24 — End: 2025-01-26
  Administered 2025-01-24 – 2025-01-25 (×2): 500 mg via ORAL

## 2025-01-24 MED ORDER — ONDANSETRON HCL (PF) 4 MG/2 ML INJECTION SOLUTION
4 | Freq: Four times a day (QID) | INTRAVENOUS | Status: DC | PRN
Start: 2025-01-24 — End: 2025-01-26

## 2025-01-24 MED ORDER — POLYETHYLENE GLYCOL 3350 17 GRAM ORAL POWDER PACKET
17 | Freq: Every day | ORAL | Status: DC
Start: 2025-01-24 — End: 2025-01-25

## 2025-01-24 MED ORDER — PANTOPRAZOLE 40 MG TABLET,DELAYED RELEASE
40 | Freq: Every day | ORAL | Status: DC
Start: 2025-01-24 — End: 2025-01-26
  Administered 2025-01-25: 14:00:00 40 mg via ORAL

## 2025-01-24 MED ORDER — TRAZODONE (DESYREL) 25 MG HALFTAB
25 | Freq: Every evening | ORAL | Status: DC
Start: 2025-01-24 — End: 2025-01-26

## 2025-01-24 MED ORDER — TRAZODONE 50 MG TABLET
50 | Freq: Every evening | ORAL | Status: AC
Start: 2025-01-24 — End: ?

## 2025-01-24 MED ORDER — GUAIFENESIN ER 600 MG TABLET, EXTENDED RELEASE 12 HR
600 | Freq: Two times a day (BID) | ORAL | Status: AC
Start: 2025-01-24 — End: ?

## 2025-01-24 MED ORDER — APIXABAN 5 MG TABLET
5 | Freq: Two times a day (BID) | ORAL | Status: DC
Start: 2025-01-24 — End: 2025-01-26
  Administered 2025-01-24 – 2025-01-25 (×4): 5 mg via ORAL

## 2025-01-24 MED ORDER — FERROUS GLUCONATE 324 MG (38 MG IRON) TABLET
324 | ORAL | Status: DC
Start: 2025-01-24 — End: 2025-01-26
  Administered 2025-01-25: 14:00:00 324 mg (38 mg iron) via ORAL

## 2025-01-24 MED ORDER — MELATONIN 3 MG TABLET
3 | Freq: Every evening | ORAL | Status: DC | PRN
Start: 2025-01-24 — End: 2025-01-26

## 2025-01-24 NOTE — Pharmacy Note - Medication Reconciliation [408012]
 PHARMACY-ASSISTED MEDICATION REPORTPharmacist review of the best possible medication history obtained by the pharmacy medication history technician has been performed.  I have updated the home medication list and identified the following information that may be relevant to this admission. Recommendations:  none Notes:Does patient use blister packaging for their home med? : NOMedHX Tech(Abigail Sarile, CPHT)The medication list was updated to the best possible state. Patient was not interviewed as patient arrived from SNF/REHAB/Outside hospital with W-10. The following resource(s) were utilized as secondary check Transfer record (W-10) - Facility name : [facility] Transfer date:01/23/2025 Current Medications Medication Sig Note  acetaminophen  (TYLENOL ) 325 mg tablet Take 3 tablets (975 mg total) by mouth every 8 (eight) hours as needed for pain.   apixaban  (ELIQUIS ) 5 mg tablet Take 1 tablet (5 mg total) by mouth every 12 (twelve) hours.   ascorbic acid , vitamin C , (VITAMIN C ) 500 mg tablet Take 1 tablet (500 mg total) by mouth daily.   ferrous gluconate  (FERGON) 324 mg (38 mg iron ) tablet Take 1 tablet (324 mg total) by mouth 2 (two) times daily.   folic acid  (FOLVITE ) 1 mg tablet Take 1 tablet (1 mg total) by mouth daily.   gabapentin  (NEURONTIN ) 400 mg capsule Take 1 capsule (400 mg total) by mouth 2 (two) times daily with breakfast and dinner.   guaiFENesin  (MUCINEX ) 600 mg 12 hr extended release tablet Take 2 tablets (1,200 mg total) by mouth every 12 (twelve) hours. For 5 days 01/24/2025: MedHX Tech(Abigail Sarile, CPHT):Per W 10 start day course on 01/23/2025 , expected to complete on 01/28/2025.   magnesium  hydroxide (MILK OF MAGNESIA) 400 mg/5 mL suspension Take 30 mLs by mouth daily as needed for constipation.   menthol -zinc  oxide (CALMOSEPTIN) 0.44-20.6 % ointment Apply topically every 8 (eight) hours. To Perineum, labia, buttocks methenamine  (HIPREX ) 1 gram tablet Take 1 tablet (1 g total) by mouth 2 (two) times daily.   pantoprazole  (PROTONIX ) 20 mg tablet Take 1 tablet (20 mg total) by mouth daily.   potassium citrate  (UROCIT-K ) 15 mEq extended release tablet Take 1 tablet (15 mEq total) by mouth daily.   rosuvastatin  (CRESTOR ) 10 mg tablet Take 1 tablet (10 mg total) by mouth every evening.   traZODone  (DESYREL ) 50 mg tablet Take 0.5 tablets (25 mg total) by mouth at bedtime.  Adrien Pages, [phone] PM2/3/2026Phone: Available via Epic Secure Chat

## 2025-01-24 NOTE — Plan of Care [1000001]
 Admission Note Nursing Danielle [name] is a 69 y.o. female admitted with a chief complaint of Fever, Congestion. Patient arrived from Facility Patient is  A&Ox4, on RA, VSS/Documented/ afebrile, All Med[facility] per [facility]- takes pill wholeIleostomy in dry/intact/ ileostomy care done Pt denies chest pain/SOB/appears in no respiratory distressAssist x2/moderate assist.Safety maintained call bell/personal items in reach All alarms onIsolation Precautions maintained.  Safety rounding on-goingNo acute events noted overnight.Problem: InfectionGoal: Absence of Infection Signs and SymptomsOutcome: Interventions implemented as appropriate Problem: Fall Injury RiskGoal: Absence of Fall and Fall-Related InjuryOutcome: Interventions implemented as appropriate Problem: UTI (Urinary Tract Infection)Goal: Improved Infection SymptomsOutcome: Interventions implemented as appropriate  Vitals:  01/23/25 1721 01/23/25 1900 01/23/25 2025 BP: 111/64 110/70 103/66 Pulse: 81 80 61 Resp: 20 20 18  Temp: 98.5 ?F (36.9 ?C)  98.9 ?F (37.2 ?C) TempSrc: Oral  Oral SpO2: 97% 98% 97% Weight: 73 kg  72.8 kg Height:   5' 10 (1.778 m) Oxygen therapy Oxygen TherapySpO2: 97 %Device: room airI have reviewed the patient's current medication orders..I have reviewed patient valuables Belongings charted in last 7 days: Patient Valuables   Patient Valuables Flowsheet                    PATIENT VALUABLE(S)       Clothing Disposition At bedside/locker/closet 01/23/25 2318

## 2025-01-24 NOTE — Plan of Care [1000001]
 Plan of Care Overview/ Patient StatusAssumed care at 0700. A&O x 4.VSS on room air. No pain/discomfort per patient. Scheduled cough medicine given. PRN ocean spray given for nasal congestion. IV flushed, patent, no s/s of infection. Takes pills whole.Pt has ileostomy with bag attached draining stool. Pt has colostomy, not in use.Incontinent of bladder. Versette catheter in place draining yellow urine. OOB Assist x2 w RW. PT to see patient.Safety/fall precautions maintained.   Contact precautions maintained. Bed alarm on. Pt educated on safety measures; Call bell within reach; instructed to use for assistance and verbalized agreement. Care ongoing.Fall risk and prevention education provided, nonskid socks at bedside, safety maintained. Pt encouraged to call for assistance to get out of bed, call button within reach. Frequent rounding and reorientation. Problem: Adult Inpatient Plan of CareGoal: Plan of Care ReviewOutcome: Interventions implemented as appropriate Problem: UTI (Urinary Tract Infection)Goal: Improved Infection SymptomsOutcome: Interventions implemented as appropriate Problem: InfectionGoal: Absence of Infection Signs and SymptomsOutcome: Interventions implemented as appropriate Problem: Skin Injury Risk IncreasedGoal: Skin Health and IntegrityOutcome: Interventions implemented as appropriate Problem: Fall Injury RiskGoal: Absence of Fall and Fall-Related InjuryOutcome: Interventions implemented as appropriate

## 2025-01-24 NOTE — Progress Notes [1]
 [facility] Medicine Progress NoteAttending Provider: Bertell Sanfilippo, MD Subjective                                                                              Subjective: Interim History: Patient had no complaints other than congestionBP a bit low, she notes that this is similar to her baseline I called the conservator to give an update but received a message that the line was no longer active Review of Allergies/Meds/Hx: Review of Allergies/Meds/Hx:I have reviewed the patient's: allergies, current scheduled medications, current infusions, current prn medications, and past medical history Objective Objective: Vitals:I have reviewed the patient's current vital signs as documented in the patient's EMR.   and Last 24 hours: Temp:  [98.5 ?F (36.9 ?C)-99.9 ?F (37.7 ?C)] 99.9 ?F (37.7 ?C)Pulse:  [61-81] 72Resp:  [18-20] 18BP: (98-111)/(62-70) 98/65SpO2:  [94 %-98 %] 96 %I/O's:I have reviewed the patient's current I&O's as documented in the EMR.Procedures:None Physical ExamHENT:    Head: Normocephalic. Cardiovascular:    Rate and Rhythm: Normal rate.    Heart sounds: No murmur heard.Pulmonary:    Effort: No respiratory distress. Musculoskeletal:    Comments: Legs large, without pitting edema  Skin:   General: Skin is warm. Neurological:    General: No focal deficit present.    Mental Status: She is alert. 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, chronic encephalopathy who presents to ED after outpatient ua showed ESBL uti.Principal Problem:  Acute cystitis without hematuria  SNOMED [place](R): ACUTE CYSTITIS Plan Plan: #ESBL UTI  - c/w ertapenem  for today  - no leukocytosisis, asymptomatic  - if BP improves, likely can d/c tomorrow on fosfomycin 	- curbsided ID who is in agreement #nasal congestion  - nasal spray prn  - viral panel negative #hld  - continue pta statin #neuropathy  - continue pta gabapentin  #hx blood loss anemia  - continue pta folic acid  #hx DVT  - continue pta eliquis  5 BID DIET: regular CODE: fullDVT: eliquis  DISPO: attempted to consult conservator, number is not active. Case manager aware. Likely plan for d/c tomorrow  Comorbidities Comorbidities present on admission: PMH of COVID  Secondary diagnoses occurring during hospitalization: Electronically Signed:Candyce Gambino Fairy Risser, PA 01/24/2025, 1:05 PM

## 2025-01-25 DIAGNOSIS — R0981 Nasal congestion: Secondary | ICD-10-CM

## 2025-01-25 DIAGNOSIS — D500 Iron deficiency anemia secondary to blood loss (chronic): Secondary | ICD-10-CM

## 2025-01-25 DIAGNOSIS — E785 Hyperlipidemia, unspecified: Secondary | ICD-10-CM

## 2025-01-25 DIAGNOSIS — G8220 Paraplegia, unspecified: Secondary | ICD-10-CM

## 2025-01-25 DIAGNOSIS — N3000 Acute cystitis without hematuria: Principal | ICD-10-CM

## 2025-01-25 DIAGNOSIS — Z1612 Extended spectrum beta lactamase (ESBL) resistance: Secondary | ICD-10-CM

## 2025-01-25 DIAGNOSIS — F0390 Unspecified dementia without behavioral disturbance: Secondary | ICD-10-CM

## 2025-01-25 DIAGNOSIS — Z993 Dependence on wheelchair: Secondary | ICD-10-CM

## 2025-01-25 DIAGNOSIS — Z8619 Personal history of other infectious and parasitic diseases: Secondary | ICD-10-CM

## 2025-01-25 DIAGNOSIS — Z7901 Long term (current) use of anticoagulants: Secondary | ICD-10-CM

## 2025-01-25 DIAGNOSIS — M069 Rheumatoid arthritis, unspecified: Secondary | ICD-10-CM

## 2025-01-25 DIAGNOSIS — G629 Polyneuropathy, unspecified: Secondary | ICD-10-CM

## 2025-01-25 DIAGNOSIS — Z1152 Encounter for screening for COVID-19: Secondary | ICD-10-CM

## 2025-01-25 DIAGNOSIS — E860 Dehydration: Secondary | ICD-10-CM

## 2025-01-25 DIAGNOSIS — Z932 Ileostomy status: Secondary | ICD-10-CM

## 2025-01-25 DIAGNOSIS — Z79899 Other long term (current) drug therapy: Secondary | ICD-10-CM

## 2025-01-25 DIAGNOSIS — B9620 Unspecified Escherichia coli [E. coli] as the cause of diseases classified elsewhere: Secondary | ICD-10-CM

## 2025-01-25 DIAGNOSIS — Z86718 Personal history of other venous thrombosis and embolism: Secondary | ICD-10-CM

## 2025-01-25 DIAGNOSIS — Z1624 Resistance to multiple antibiotics: Secondary | ICD-10-CM

## 2025-01-25 DIAGNOSIS — B9689 Other specified bacterial agents as the cause of diseases classified elsewhere: Secondary | ICD-10-CM

## 2025-01-25 DIAGNOSIS — Z87442 Personal history of urinary calculi: Secondary | ICD-10-CM

## 2025-01-25 DIAGNOSIS — Z8616 Personal history of COVID-19: Secondary | ICD-10-CM

## 2025-01-25 LAB — BASIC METABOLIC PANEL
BKR ANION GAP: 8 (ref 7–17)
BKR BLOOD UREA NITROGEN: 11 mg/dL (ref 8–23)
BKR BUN / CREAT RATIO: 14.7 (ref 8.0–23.0)
BKR CALCIUM: 9.9 mg/dL (ref 8.8–10.2)
BKR CHLORIDE: 109 mmol/L — ABNORMAL HIGH (ref 98–107)
BKR CO2: 22 mmol/L (ref 20–30)
BKR CREATININE DELTA: -0.09
BKR CREATININE: 0.75 mg/dL (ref 0.51–0.95)
BKR EGFR, CREATININE (CKD-EPI 2021): >60 mL/min/{1.73_m2} (ref >=60)
BKR GLUCOSE: 94 mg/dL (ref 70–140)
BKR POTASSIUM: 4 mmol/L (ref 3.3–5.5)
BKR SODIUM: 139 mmol/L (ref 136–145)

## 2025-01-25 LAB — CBC WITHOUT DIFFERENTIAL
BKR WAM ANC (ABSOLUTE NEUTROPHIL COUNT): 3.57 x 1000/ÂµL (ref 2.00–7.60)
BKR WAM HEMATOCRIT: 33 % — ABNORMAL LOW (ref 35.00–45.00)
BKR WAM HEMOGLOBIN: 10.4 g/dL — ABNORMAL LOW (ref 11.7–15.5)
BKR WAM MCH: 27.4 pg (ref 27.0–33.0)
BKR WAM MCHC: 31.5 g/dL (ref 31.0–36.0)
BKR WAM MCV: 87.1 fL (ref 80.0–100.0)
BKR WAM MPV: 9.7 fL (ref 8.0–12.0)
BKR WAM PLATELETS: 270 10*3/uL (ref 150–420)
BKR WAM RDW-CV: 14.8 % (ref 11.0–15.0)
BKR WAM RED BLOOD CELL COUNT.: 3.79 M/ÂµL — ABNORMAL LOW (ref 4.00–6.00)
BKR WAM WHITE BLOOD CELL COUNT: 6.3 10*3/uL (ref 4.0–11.0)

## 2025-01-25 MED ORDER — FOSFOMYCIN TROMETHAMINE 3 GRAM ORAL PACKET
3 | ORAL | Status: AC
Start: 2025-01-25 — End: ?

## 2025-01-25 NOTE — Plan of Care [1000001]
 Problem: Adult Inpatient Plan of CareGoal: Plan of Care ReviewOutcome: Outcome(s) achievedGoal: Patient-Specific Goal (Individualized)Outcome: Outcome(s) achievedGoal: Absence of Hospital-Acquired Illness or InjuryOutcome: Outcome(s) achievedGoal: Optimal Comfort and WellbeingOutcome: Outcome(s) achievedGoal: Readiness for Transition of CareOutcome: Outcome(s) achieved Problem: InfectionGoal: Absence of Infection Signs and SymptomsOutcome: Outcome(s) achieved Problem: Skin Injury Risk IncreasedGoal: Skin Health and IntegrityOutcome: Outcome(s) achieved Problem: Fall Injury RiskGoal: Absence of Fall and Fall-Related InjuryOutcome: Outcome(s) achieved Problem: UTI (Urinary Tract Infection)Goal: Improved Infection SymptomsOutcome: Outcome(s) achieved Plan of Care Overview/ Patient StatusReceived patient alert/oriented x3, VS as documented,afebrile,On RA, SPO2 96%, no acute respiratory distress, appearsComfortable, ileostomy with liquid stools, due meds given perMAR,tolerated well, cleared for DC back to [facility] pick up schedule is at 1230.Patient discharged via ACCESS ambulance @ 1210.

## 2025-01-25 NOTE — Plan of Care [1000001]
 Plan of Care Overview/ Patient StatusCrystal A. 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 Phone number is disconnected. T/c made to Drake at [facility] to confirm who they have been discussing patient's care with. Per Nat Charlies is no longer patient's Conservator. Current Conservator is [employer] [phone]. Tc Dorothe is working on getting a denial for patient to return to [facility] for LTC. CM faxed discharge summary and W10 to [facility] [phone] Management/Social Work Screening and Evaluation  Flowsheet Row Most Recent Value Case Management/Social Work Screening: Chart review completed. If YES to any question below then proceed to Eval/Plan  Is there a change in their cognitive function No Do you anticipate that the patient will have any discharge needs requiring CM/SW intervention? Yes Has there been an unscheduled readmission within the past 30 days and/or >four (4) admissions within 12 months? No Were there services prior to admission ( Examples: [facility],  Assisted Living, HD, Homecare, Extended Care Facility, Methadone, SNF, Outpatient Infusion Center) Yes Concern for current abuse/neglect/interpersonal violence/sexual assault  No Connected to state agency? No Guardianship or conservatorship No Negative/Positive Screen Positive Screening: Complete CM/SW Evaluation and Plan Case Manager/Social Work Patent Attorney  I have reviewed the medical record and completed the above screen. CM/SW staff will follow patient's progress and discuss the plan of care with the Treatment Team. Yes Case Management/Social Work Evaluation and Plan  Arrived from prior to admission SNF (receiving long term care) [name] Bed Hold: 15 days from admission date yes Bed Hold Payor: T19 Lives with Facility Resident Documented Insurance Accurate Yes Any financial concerns related to anticipated discharge needs No Patient's home address verified Yes Patient's PCP of record verified Yes Last Date Seen by PCP 3-6 months Patient has new PCP (specify name). PLEASE UPDATE DEMOGRAPHICS [name] Source of Clinical History  Patient's clinical history has been reviewed and source of Information is: Psychologist, Clinical Recommendations  Discharge Planning Coordination Recommendations Skilled Nursing Facility for long term care Case Manager/Social Worker reviewed plan of care/ continuum of care need's with  Chief Of Staff, Discharge Planning Rounds Housing / Transportation/ Environment  What is your living situation today? I have a steady place to live Think about the place you live. Do you have problems with any of the following? None In the past 12 months has the electric, gas, oil, or water  company threatened to shut off services in your home? No Within the past 12 months, you worried that your food would run out before you got the money to buy more. Never true Within the past 12 months, the food you bought just didn't last and you didn't have money to get more. Never true In the past 12 months, has lack of transportation kept you from medical appointments or from getting medications? no In the past 12 months, has lack of transportation kept you from meetings, work, or from getting things needed for daily living? No  Abuse Screen  Is there anyone in your life that is hurting or threatening you in anyway? no Abuse Screen (yes response referral indicated)  Able to respond to abuse questions Yes Is there anyone in your life that is hurting or threatening you in anyway? no Physical Indicators of Abuse No evidence of physical abuse Montie Kerbs, RN, BSNNurse Case  [facility]

## 2025-01-25 NOTE — Plan of Care [1000001]
 Problem: Physical Therapy GoalsGoal: Physical Therapy GoalsDescription: PT GOALS1. Patient will perform bed mobility with minimal assist2. Patient will perform transfers with minimal assist using appropriate assistive device 3. Patient will ambulate a minimum of 50 feet with minimal assist using appropriate assistive device 4. Patient will perform HEP with supervision2/03/2025 1239 by [place] Cathlean FALCON, PTOutcome: Interventions implemented as appropriatePhysical Therapy Evaluation Patient Overview - 01/25/25 1201    Date of Visit / Treatment  Date of Visit / Treatment 01/25/25   Note Type Evaluation   Progress Report Due 01/25/25   Start Time 1003   End Time 1033    Patient Overview  History of Present Illness Pt is a 69 yo F admitted with Acute Cystitis.  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   General Observations Pt found supine, alert, pleasant and cooperative.     Assessment - 01/25/25 1201    Assessment  Cognition (Mentation/Communication) alert   Skin Integrity/Edema see skin documentation   Sensation no complaints   Range of Motion within functional limits except (see comments)   Muscle Strength/Tone within functional limits except (see comments)   Muscle Strength/Tone - Additional Details/Comments B UE grossly 3/5, L LE grossly 4/5, R LE 3+/5   Balance - Additional Details/Comments Sitting static: Fair-  dynamic:Poor+ , standing static: Poor-  dynamic: Poor-     Functional Mobility - 01/25/25 1201    Functional Mobility  Supine to/from Sit Moderate assist;Assist of 1   Sit to/from Stand Moderate assist;Assist of 1   Sit to/from Stand Device Rolling walker   Ambulation Moderate assist;Assist of 1   Ambulation Device Rolling walker   Ambulation Distance 5 feet     AMPAC Basic Mobility - 01/25/25 1201    PT- AM-PAC - Basic Mobility Screen- How much help from another person do you currently need.....  Turning from your back to your side while in a a flat bed without using rails? 2 - A Lot - Requires a lot of help (maximum to moderate assistance). Can use assistive devices.   Moving from lying on your back to sitting on the side of a flat bed without using bed rails? 2 - A Lot - Requires a lot of help (maximum to moderate assistance). Can use assistive devices.   Moving to and from a bed to a chair (including a wheelchair)? 2 - A Lot - Requires a lot of help (maximum to moderate assistance). Can use assistive devices.   Standing up from a chair using your arms(e.g., wheelchair or bedside chair)? 2 - A Lot - Requires a lot of help (maximum to moderate assistance). Can use assistive devices.   To walk in a hospital room? 2 - A Lot - Requires a lot of help (maximum to moderate assistance). Can use assistive devices.   Climbing 3-5 steps with a railing? 1 - Total - Requires total assistance or cannot do it at all.   AMPAC Mobility Score 11   TARGET Highest Level of Mobility Mobility Level 4, Transfer to chair   ACTUAL Highest Level of Mobility Mobility Level 5, Stand for 1 minute     Recommendations for IP Admission - 01/25/25 1201  PT Recommendations for Inpatient Admission  Activity/Level of Assist out of bed;ambulate;assist of 1;with rolling walker;in room;in hall     EDU - Learning Assessment - 01/25/25 1201    Education - Learning Assessment   Education Topic Energy conservation;Functional mobility techniques;Safety;Therapy role/rehab process;Disease process;Weight bearing;Discharge planning;Assistance/guarding;Uses of devices/equipment;Exercise program   Learners Patient   Readiness Eager;Acceptance   Method Explanation;Demonstration   Response Verbalizes Understanding;Demonstrated Understanding     Clinical Impression/Recommendation - 01/25/25 1201    Clinical Impression / Recommendation  Initial Assessment Pt is a 69 yo F admitted with Acute Cystitis.  Pt was able to sit at EOB with moderate assist and limited by trunk control.  Pt was able to stand and side steps 5' using a RW with moderate assist and limited by weakness, fatigue, decrease balance and endurance.  She will need assist for all mobility with skilled PT.  Recommend RW for ambulation.   Patient Goal return home   Physical Therapy Disposition Recommendation Moderate complexity support and therapy to progress functional mobility/ ADLs/ IADLs recommended for post- acute care.  See assessment for additional details.   Equipment Recommendations for Discharge Patient has all necessary durable medical equipment     PT Handoff - 01/25/25 1201    Handoff Documentation  Handoff Patient in bed;Bed alarm;Discussed with nursing;Patient instructed to call nursing for mobility    Cathlean Mater, PT, DPTPhysical [facility]

## 2025-01-25 NOTE — Care Coordination Communication [[id number]]
 01/25/25 1017 Authorization Information Date Authorization Initiated:  01/25/25 Time Authorization Initiated: 0832 Facility Name [facility] Authorization yes Insurance Company [employer] [facility] [employer] Contact Name/# [phone] Facility Authorization #/Details Case [id number]  2-4- to 2-10 Date Authorization recieved 01/25/25 Time Authorization recieved 1030 Follow up contact necessary Yes Contact Name Jess Contact phone: [phone] [employer]. Fax number: [phone]

## 2025-01-25 NOTE — Plan of Care [1000001]
 Plan of Care Overview/ Patient StatusPatient is  A&Ox4, on RA, VSS/Documented/ afebrile, All Meds given per [facility]- takes pill wholeIleostomy in dry/intact/ ileostomy care done Pt denies chest pain/SOB/appears in no respiratory distressAssist x2Safety maintained call bell/personal items in reach All alarms onContact Isolation Precautions maintained. Safety rounding on-goingNo acute events noted overnight.Problem: InfectionGoal: Absence of Infection Signs and SymptomsOutcome: Interventions implemented as appropriate Problem: Skin Injury Risk IncreasedGoal: Skin Health and IntegrityOutcome: Interventions implemented as appropriate Problem: Fall Injury RiskGoal: Absence of Fall and Fall-Related InjuryOutcome: Interventions implemented as appropriate

## 2025-01-25 NOTE — Significant Event [1000007]
 Seen at bedside in no acute distress.  No acute events overnight.  Status post 2 days of ertapenem  will be discharging on fosfomycin  for total of a week.  Continue all prior home medication regimen.Patient's chart, vitals and labs were reviewed.  Patient was seen, discussed and evaluated with APP Kessler.  Agree with APP Kessler's treatment plan and management.

## 2025-01-25 NOTE — Discharge Instructions [18]
 Continue to take fosfomycin  for 2 more doses, one dose on 02/05, and another dose on 02/07

## 2025-01-25 NOTE — Care Coordination Communication [[id number]]
 Secured facility:[name]         Shara has been initiated with this patient's insurer. Clinical information submitted to this patient's insurer for review today.We now await the authorization that will allow this patient to transfer to the facility.  Insurance Contact: Phone: [phone]: [phone] Case [id number]

## 2025-01-26 ENCOUNTER — Encounter: Admit: 2025-01-26 | Payer: PRIVATE HEALTH INSURANCE | Primary: Nephrology

## 2025-01-26 LAB — URINE CULTURE

## 2025-01-26 NOTE — Discharge Summary [5]
 [facility] SummaryPatient Data:  Patient Name: Danielle [name] Admit date: 01/23/2025 Age: 69 y.o. Discharge date: 01/25/2025 DOB: [birth year]	 Discharge Attending Physician: Bertell Sanfilippo, MD  MRN: [id number]	 Discharged Condition: good PCP: Hope Gwenn MANIFOLD, MD  Disposition: Skilled Nursing Facility for Long Term Care Principal Diagnosis: Cystitis Comorbidities Comorbidities present on admission: PMH of COVID  Secondary diagnoses occurring during hospitalization: Post Discharge Follow Up Items: Issues to be Addressed Post Discharge:Continue to take fosfomycin  for 2 more doses, one dose on 02/05, and another dose on 02/07Medication changes on discharge (Full medication list at conclusion of this summary) :fosfomycinPending Labs and Tests: Pending Lab Results   Order Current Status  Blood Culture -1 set Preliminary result  Blood Culture -1 set Preliminary result  Blood culture Preliminary result  Blood culture Preliminary result  Urine culture Preliminary result  Follow-up Information:Walsh, Gwenn MANIFOLD, MDNATHANIEL LAVERNA [address]  [phone] Future Appointments Date Time Provider Department Center 04/03/2025 10:00 AM [facility] VIRTUAL ROOM [facility] [place] [facility] Imaging 04/05/2025 10:45 AM Thomasene Netter, MD [facility] UROLOGY Urology - Cl 05/30/2025 10:30 AM [facility] LAB TECH BEND [facility] LAB DRAW 05/30/2025 11:00 AM Jama Anthon Grooms, MD [facility] None Hospital Course: 69 y.o. female PMHx DVT, RA, ureteral stricture requiring stents, altered mental status, diverticular perforation complicated by hemoperitoneum now status post ileostomy, chronic encephalopathy who presents to emergency department for ESBL UTI.Patient notes that she was asymptomatic for this urinary tract infection, and was without leukocytosis or significant elevation inflammatory markers.Patient was started on ertapenem  per the recommendation of Infectious Disease, for which she received 2 doses.  Her blood pressure remains soft, however this is similar to her baseline.After receiving 2 doses of ertapenem , we discussed with Infectious Disease plan to discharge patient with fosfomycin  to complete 7 day course. Inpatient Consultants and summary of recommendations:Pertinent Procedures or Surgeries: Data: Pertinent lab findings:Recent Labs Lab 02/02/261735 02/03/260647 02/04/260656 WBC 9.3 6.0 6.3 HGB 11.3* 9.6* 10.4* HCT 37.50 31.50* 33.00* PLT 333 289 270  Recent Labs Lab 02/02/261735 02/03/260647 NEUTROPHILS 69.9 57.8  Recent Labs Lab 02/02/261735 02/03/260648 02/04/260656 NA 138 137 139 K 4.0 4.1 4.0 CL 104 106 109* CO2 24 23 22  BUN 11 13 11  CREATININE 0.87 0.84 0.75 GLU 97 98 94 ANIONGAP 10 8 8   Recent Labs Lab 02/02/261735 02/03/260648 02/04/260656 CALCIUM  10.4* 9.6 9.9  Recent Labs Lab 02/02/261735 ALT <5 AST 14 ALKPHOS 101 BILITOT 0.2  Recent Labs Lab 02/02/261735 LABPROT 11.1 INR 1.07  Microbiology:Recent Labs Lab 01/29/262323 02/02/261735 02/02/261855 02/02/261919 LABBLOO  --  No Growth to Date  --  No Growth to Date LABURIN 10,000-49,000 CFU/mL Escherichia coli*  --  10,000-49,000 CFU/mL Escherichia coli*  --  Imaging: Imaging results last 1 week:  [place] Abdomen Pelvis wo IV Contrast (No Oral)  (Renal Stone)Result Date: 2/2/20261. Bilateral nonobstructing renal calculi are seen. No ureteric calculus or hydronephrosis is demonstrated. 2. Multiple gallstones are seen in the gallbladder lumen and calcified uterine fibroids are demonstrated. 3. The patient is status post right partial colectomy with a right lower quadrant ileostomy in the left mid abdominal ostomy at the origin of the residual left colon. 4. Diverticulosis of the residual colon is seen and there is distention of the rectum with formed stool. 5. Prominent lymph nodes are seen in the abdominal and pelvic retroperitoneum without pathologic enlargement by Milton criteria. This [place] scan was performed utilizing techniques to reduce radiation dose including Automated Exposure Control, mA / kV adjustment for patient size, and / or iterative reconstruction.  Reported and signed by:  Lonni Booth, MD  Diet:  Diet RegularMobility: Highest Level of mobility - ACTUAL: Mobility Level 2, Turn self in bed/bed activity/dependent transfer, AM PAC 6-7  Physical Exam Discharge vital signs: Vitals:  01/25/25 0752 BP: (!) 94/59 Pulse: 68 Resp: 20 Temp: 98.2 ?F (36.8 ?C) Cognitive Status at Discharge: BaselinePhysical ExamConstitutional:     Appearance: Normal appearance. Cardiovascular:    Rate and Rhythm: Normal rate.    Heart sounds: No murmur heard.Pulmonary:    Effort: No respiratory distress. Musculoskeletal:    Comments: Large legs without pitting edema Neurological:    General: No focal deficit present.    Mental Status: She is alert. History  Allergies Allergies  No Known Allergies   PMH PSH Past Medical History[1] Past Surgical History[2] Social History Family History Social History Tobacco Use  Smoking status: Never  Smokeless tobacco: Never Substance Use Topics  Alcohol  use: Not Currently  No family history on file.   Discharge Medications  Discharge:  Medication List  Start    Dose Start Date Stop Date Comments Indications of Use fosfomycin  3 gram packetCommonly known as: MONUROLTake 3 g by mouth every other day for 2 days. 3 g January 26, 2025 January 28, 2025    Continue taking these medications    Dose Start Date Stop Date Comments Indications of Use acetaminophen  325 mg tabletCommonly known as: TYLENOLTake 3 tablets (975 mg total) by mouth every 8 (eight) hours as needed for pain. 975 mg     apixaban  5 mg Tab tabletCommonly known as: ELIQUISTake 1 tablet (5 mg total) by mouth every 12 (twelve) hours. 5 mg     ferrous gluconate  324 mg (38 mg iron ) tabletCommonly known as: FERGONTake 1 tablet (324 mg total) by mouth 2 (two) times daily. 1 tablet     folic acid  1 mg tabletCommonly known as: FOLVITETake 1 tablet (1 mg total) by mouth daily. 1 mg     gabapentin  400 mg capsuleCommonly known as: NEURONTINTake 1 capsule (400 mg total) by mouth 2 (two) times daily with breakfast and dinner. 400 mg     guaiFENesin  600 mg 12 hr extended release tabletCommonly known as: MUCINEXTake 2 tablets (1,200 mg total) by mouth every 12 (twelve) hours. For 5 days 1,200 mg     magnesium  hydroxide 400 mg/5 mL suspensionCommonly known as: MILK OF MAGNESIATake 30 mLs by mouth daily as needed for constipation. 30 mL     menthol -zinc  oxide 0.44-20.6 % ointmentCommonly known as: CALMOSEPTINApply topically every 8 (eight) hours. To Perineum, labia, buttocks      methenamine  hippurate 1 gram tabletCommonly known as: HIPREXTake 1 tablet (1 g total) by mouth 2 (two) times daily. 1 g     pantoprazole  20 mg tabletCommonly known as: PROTONIXTake 1 tablet (20 mg total) by mouth daily. 20 mg     potassium citrate  15 mEq extended release tabletCommonly known as: UROCIT-KTake 1 tablet (15 mEq total) by mouth daily. 1 tablet     rosuvastatin  10 mg tabletCommonly known as: CRESTORTake 1 tablet (10 mg total) by mouth every evening. 10 mg     traZODone  50 mg tabletCommonly known as: DESYRELTake 0.5 tablets (25 mg total) by mouth at bedtime. 25 mg     vitamin C  500 mg tabletGeneric drug: ascorbic acid  (vitamin C )Take 1 tablet (500 mg total) by mouth daily. 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

## 2025-04-03 ENCOUNTER — Ambulatory Visit: Admit: 2025-04-03 | Payer: PRIVATE HEALTH INSURANCE | Primary: Nephrology

## 2025-04-05 ENCOUNTER — Ambulatory Visit: Admit: 2025-04-05 | Payer: PRIVATE HEALTH INSURANCE | Attending: Urology | Primary: Nephrology

## 2025-05-30 ENCOUNTER — Ambulatory Visit: Admit: 2025-05-30 | Payer: PRIVATE HEALTH INSURANCE | Primary: Nephrology

## 2025-05-30 ENCOUNTER — Ambulatory Visit: Admit: 2025-05-30 | Payer: PRIVATE HEALTH INSURANCE | Attending: Medical Oncology | Primary: Nephrology
